# Patient Record
Sex: Female | Born: 1955 | Race: White | Hispanic: No | Marital: Married | Smoking: Former smoker
Health system: Southern US, Community
[De-identification: ages and names within clinical notes are randomized; demographics above are authoritative.]

## PROBLEM LIST (undated history)

## (undated) DIAGNOSIS — M5412 Radiculopathy, cervical region: Secondary | ICD-10-CM

## (undated) DIAGNOSIS — R209 Unspecified disturbances of skin sensation: Secondary | ICD-10-CM

## (undated) DIAGNOSIS — E785 Hyperlipidemia, unspecified: Secondary | ICD-10-CM

## (undated) DIAGNOSIS — I340 Nonrheumatic mitral (valve) insufficiency: Secondary | ICD-10-CM

## (undated) DIAGNOSIS — N200 Calculus of kidney: Secondary | ICD-10-CM

## (undated) DIAGNOSIS — J449 Chronic obstructive pulmonary disease, unspecified: Secondary | ICD-10-CM

## (undated) DIAGNOSIS — F17200 Nicotine dependence, unspecified, uncomplicated: Secondary | ICD-10-CM

## (undated) DIAGNOSIS — M1990 Unspecified osteoarthritis, unspecified site: Secondary | ICD-10-CM

## (undated) DIAGNOSIS — F329 Major depressive disorder, single episode, unspecified: Secondary | ICD-10-CM

## (undated) HISTORY — DX: Unspecified disturbances of skin sensation: R20.9

## (undated) HISTORY — DX: Hyperlipidemia, unspecified: E78.5

## (undated) HISTORY — DX: Calculus of kidney: N20.0

## (undated) HISTORY — DX: Chronic obstructive pulmonary disease, unspecified: J44.9

## (undated) HISTORY — DX: Unspecified osteoarthritis, unspecified site: M19.90

## (undated) HISTORY — DX: Major depressive disorder, single episode, unspecified: F32.9

## (undated) HISTORY — DX: Radiculopathy, cervical region: M54.12

## (undated) HISTORY — PX: ABDOMINAL HYSTERECTOMY: SHX81

## (undated) HISTORY — DX: Nicotine dependence, unspecified, uncomplicated: F17.200

---
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
## 1987-04-08 HISTORY — PX: CHOLECYSTECTOMY: SHX55

## 1988-04-07 HISTORY — PX: OTHER SURGICAL HISTORY: SHX169

## 2003-04-19 ENCOUNTER — Other Ambulatory Visit: Admission: RE | Admit: 2003-04-19 | Discharge: 2003-04-19 | Payer: Self-pay | Admitting: Obstetrics and Gynecology

## 2004-01-23 ENCOUNTER — Encounter
Admission: RE | Admit: 2004-01-23 | Discharge: 2004-01-23 | Payer: Self-pay | Admitting: Physical Medicine and Rehabilitation

## 2004-07-29 ENCOUNTER — Other Ambulatory Visit: Admission: RE | Admit: 2004-07-29 | Discharge: 2004-07-29 | Payer: Self-pay | Admitting: Obstetrics and Gynecology

## 2004-08-22 ENCOUNTER — Encounter: Admission: RE | Admit: 2004-08-22 | Discharge: 2004-08-22 | Payer: Self-pay | Admitting: Obstetrics and Gynecology

## 2004-12-04 ENCOUNTER — Ambulatory Visit: Payer: Self-pay | Admitting: Internal Medicine

## 2004-12-11 ENCOUNTER — Ambulatory Visit: Payer: Self-pay | Admitting: Internal Medicine

## 2006-06-04 ENCOUNTER — Ambulatory Visit: Payer: Self-pay | Admitting: Internal Medicine

## 2006-11-19 DIAGNOSIS — F329 Major depressive disorder, single episode, unspecified: Secondary | ICD-10-CM

## 2006-11-19 DIAGNOSIS — F3289 Other specified depressive episodes: Secondary | ICD-10-CM

## 2006-11-19 DIAGNOSIS — F32A Depression, unspecified: Secondary | ICD-10-CM | POA: Insufficient documentation

## 2006-11-19 DIAGNOSIS — F419 Anxiety disorder, unspecified: Secondary | ICD-10-CM | POA: Insufficient documentation

## 2006-11-19 HISTORY — DX: Other specified depressive episodes: F32.89

## 2006-11-19 HISTORY — DX: Major depressive disorder, single episode, unspecified: F32.9

## 2007-05-04 ENCOUNTER — Encounter: Payer: Self-pay | Admitting: Internal Medicine

## 2007-05-13 ENCOUNTER — Ambulatory Visit: Payer: Self-pay | Admitting: Internal Medicine

## 2007-05-13 DIAGNOSIS — M549 Dorsalgia, unspecified: Secondary | ICD-10-CM | POA: Insufficient documentation

## 2007-06-04 ENCOUNTER — Ambulatory Visit: Payer: Self-pay | Admitting: Internal Medicine

## 2007-06-04 DIAGNOSIS — L03211 Cellulitis of face: Secondary | ICD-10-CM

## 2007-06-04 DIAGNOSIS — L0201 Cutaneous abscess of face: Secondary | ICD-10-CM

## 2007-06-23 ENCOUNTER — Ambulatory Visit: Payer: Self-pay | Admitting: Internal Medicine

## 2007-06-23 ENCOUNTER — Ambulatory Visit: Payer: Self-pay | Admitting: Cardiology

## 2007-07-01 ENCOUNTER — Encounter: Payer: Self-pay | Admitting: Internal Medicine

## 2007-08-24 ENCOUNTER — Ambulatory Visit: Payer: Self-pay | Admitting: Internal Medicine

## 2007-08-24 DIAGNOSIS — J069 Acute upper respiratory infection, unspecified: Secondary | ICD-10-CM | POA: Insufficient documentation

## 2007-08-24 DIAGNOSIS — R209 Unspecified disturbances of skin sensation: Secondary | ICD-10-CM

## 2007-08-24 HISTORY — DX: Unspecified disturbances of skin sensation: R20.9

## 2007-08-31 ENCOUNTER — Ambulatory Visit: Payer: Self-pay | Admitting: Internal Medicine

## 2007-08-31 DIAGNOSIS — J449 Chronic obstructive pulmonary disease, unspecified: Secondary | ICD-10-CM

## 2007-08-31 DIAGNOSIS — M545 Low back pain: Secondary | ICD-10-CM

## 2007-08-31 DIAGNOSIS — J4489 Other specified chronic obstructive pulmonary disease: Secondary | ICD-10-CM

## 2007-08-31 HISTORY — DX: Chronic obstructive pulmonary disease, unspecified: J44.9

## 2007-08-31 HISTORY — DX: Other specified chronic obstructive pulmonary disease: J44.89

## 2007-12-23 ENCOUNTER — Ambulatory Visit: Payer: Self-pay | Admitting: Internal Medicine

## 2007-12-23 LAB — CONVERTED CEMR LAB
ALT: 14 units/L (ref 0–35)
AST: 16 units/L (ref 0–37)
Albumin: 4 g/dL (ref 3.5–5.2)
Alkaline Phosphatase: 84 units/L (ref 39–117)
BUN: 12 mg/dL (ref 6–23)
Basophils Absolute: 0.1 10*3/uL (ref 0.0–0.1)
Basophils Relative: 0.7 % (ref 0.0–3.0)
Bilirubin Urine: NEGATIVE
Bilirubin, Direct: 0.2 mg/dL (ref 0.0–0.3)
Blood in Urine, dipstick: NEGATIVE
CO2: 29 meq/L (ref 19–32)
Calcium: 9.5 mg/dL (ref 8.4–10.5)
Chloride: 103 meq/L (ref 96–112)
Cholesterol: 205 mg/dL (ref 0–200)
Creatinine, Ser: 0.8 mg/dL (ref 0.4–1.2)
Direct LDL: 149.8 mg/dL
Eosinophils Absolute: 0.2 10*3/uL (ref 0.0–0.7)
Eosinophils Relative: 1.8 % (ref 0.0–5.0)
GFR calc Af Amer: 96 mL/min
GFR calc non Af Amer: 80 mL/min
Glucose, Bld: 93 mg/dL (ref 70–99)
Glucose, Urine, Semiquant: NEGATIVE
HCT: 41.4 % (ref 36.0–46.0)
HDL: 30.6 mg/dL — ABNORMAL LOW (ref >39.0)
Hemoglobin: 14.2 g/dL (ref 12.0–15.0)
Ketones, urine, test strip: NEGATIVE
LDL Cholesterol: 151 mg/dL — ABNORMAL HIGH (ref 0–99)
Lymphocytes Relative: 32.2 % (ref 12.0–46.0)
MCHC: 34.4 g/dL (ref 30.0–36.0)
MCV: 94.5 fL (ref 78.0–100.0)
Monocytes Absolute: 0.6 10*3/uL (ref 0.1–1.0)
Monocytes Relative: 7.3 % (ref 3.0–12.0)
Neutro Abs: 4.8 10*3/uL (ref 1.4–7.7)
Neutrophils Relative %: 58 % (ref 43.0–77.0)
Nitrite: NEGATIVE
Platelets: 263 10*3/uL (ref 150–400)
Potassium: 4.3 meq/L (ref 3.5–5.1)
Protein, U semiquant: NEGATIVE
RBC: 4.38 M/uL (ref 3.87–5.11)
RDW: 13.6 % (ref 11.5–14.6)
Sodium: 141 meq/L (ref 135–145)
Specific Gravity, Urine: 1.025
TSH: 1.27 microintl units/mL (ref 0.35–5.50)
Total Bilirubin: 0.7 mg/dL (ref 0.3–1.2)
Total CHOL/HDL Ratio: 6.7
Total Protein: 7 g/dL (ref 6.0–8.3)
Triglycerides: 119 mg/dL (ref 0–149)
Urobilinogen, UA: 0.2
VLDL: 24 mg/dL (ref 0–40)
WBC Urine, dipstick: NEGATIVE
WBC: 8.4 10*3/uL (ref 4.5–10.5)
pH: 5.5

## 2008-01-05 ENCOUNTER — Ambulatory Visit: Payer: Self-pay | Admitting: Internal Medicine

## 2008-01-05 ENCOUNTER — Other Ambulatory Visit: Admission: RE | Admit: 2008-01-05 | Discharge: 2008-01-05 | Payer: Self-pay | Admitting: Internal Medicine

## 2008-01-05 ENCOUNTER — Encounter: Payer: Self-pay | Admitting: Internal Medicine

## 2008-01-05 DIAGNOSIS — N200 Calculus of kidney: Secondary | ICD-10-CM

## 2008-01-05 HISTORY — DX: Calculus of kidney: N20.0

## 2008-01-05 LAB — HM PAP SMEAR

## 2009-03-06 ENCOUNTER — Ambulatory Visit: Payer: Self-pay | Admitting: Internal Medicine

## 2009-03-06 LAB — CONVERTED CEMR LAB
ALT: 16 units/L (ref 0–35)
AST: 20 units/L (ref 0–37)
Albumin: 4.2 g/dL (ref 3.5–5.2)
Alkaline Phosphatase: 91 units/L (ref 39–117)
BUN: 11 mg/dL (ref 6–23)
Basophils Absolute: 0.1 10*3/uL (ref 0.0–0.1)
Basophils Relative: 0.8 % (ref 0.0–3.0)
Bilirubin Urine: NEGATIVE
Bilirubin, Direct: 0 mg/dL (ref 0.0–0.3)
Blood in Urine, dipstick: NEGATIVE
CO2: 31 meq/L (ref 19–32)
Calcium: 9.8 mg/dL (ref 8.4–10.5)
Chloride: 106 meq/L (ref 96–112)
Cholesterol: 234 mg/dL — ABNORMAL HIGH (ref 0–200)
Creatinine, Ser: 0.7 mg/dL (ref 0.4–1.2)
Direct LDL: 172.3 mg/dL
Eosinophils Absolute: 0.3 10*3/uL (ref 0.0–0.7)
Eosinophils Relative: 2.2 % (ref 0.0–5.0)
GFR calc non Af Amer: 92.31 mL/min (ref >60)
Glucose, Bld: 104 mg/dL — ABNORMAL HIGH (ref 70–99)
Glucose, Urine, Semiquant: NEGATIVE
HCT: 43 % (ref 36.0–46.0)
HDL: 33 mg/dL — ABNORMAL LOW (ref >39.00)
Hemoglobin: 14.4 g/dL (ref 12.0–15.0)
Ketones, urine, test strip: NEGATIVE
Lymphocytes Relative: 36.3 % (ref 12.0–46.0)
Lymphs Abs: 4.6 10*3/uL — ABNORMAL HIGH (ref 0.7–4.0)
MCHC: 33.6 g/dL (ref 30.0–36.0)
MCV: 97.3 fL (ref 78.0–100.0)
Monocytes Absolute: 0.9 10*3/uL (ref 0.1–1.0)
Monocytes Relative: 7.5 % (ref 3.0–12.0)
Neutro Abs: 6.7 10*3/uL (ref 1.4–7.7)
Neutrophils Relative %: 53.2 % (ref 43.0–77.0)
Nitrite: NEGATIVE
Platelets: 253 10*3/uL (ref 150.0–400.0)
Potassium: 4.3 meq/L (ref 3.5–5.1)
Protein, U semiquant: NEGATIVE
RBC: 4.42 M/uL (ref 3.87–5.11)
RDW: 13.3 % (ref 11.5–14.6)
Sodium: 144 meq/L (ref 135–145)
Specific Gravity, Urine: 1.025
TSH: 1.87 microintl units/mL (ref 0.35–5.50)
Total Bilirubin: 0.6 mg/dL (ref 0.3–1.2)
Total CHOL/HDL Ratio: 7
Total Protein: 7.4 g/dL (ref 6.0–8.3)
Triglycerides: 213 mg/dL — ABNORMAL HIGH (ref 0.0–149.0)
Urobilinogen, UA: 0.2
VLDL: 42.6 mg/dL — ABNORMAL HIGH (ref 0.0–40.0)
WBC Urine, dipstick: NEGATIVE
WBC: 12.6 10*3/uL — ABNORMAL HIGH (ref 4.5–10.5)
pH: 5

## 2009-03-12 ENCOUNTER — Ambulatory Visit: Payer: Self-pay | Admitting: Internal Medicine

## 2009-03-12 DIAGNOSIS — E785 Hyperlipidemia, unspecified: Secondary | ICD-10-CM | POA: Insufficient documentation

## 2009-03-12 HISTORY — DX: Hyperlipidemia, unspecified: E78.5

## 2009-06-05 ENCOUNTER — Ambulatory Visit: Payer: Self-pay | Admitting: Internal Medicine

## 2009-06-05 DIAGNOSIS — M1990 Unspecified osteoarthritis, unspecified site: Secondary | ICD-10-CM

## 2009-06-05 DIAGNOSIS — M5412 Radiculopathy, cervical region: Secondary | ICD-10-CM | POA: Insufficient documentation

## 2009-06-05 HISTORY — DX: Unspecified osteoarthritis, unspecified site: M19.90

## 2009-06-05 HISTORY — DX: Radiculopathy, cervical region: M54.12

## 2009-06-11 ENCOUNTER — Ambulatory Visit: Payer: Self-pay | Admitting: Internal Medicine

## 2009-06-18 ENCOUNTER — Encounter: Payer: Self-pay | Admitting: Internal Medicine

## 2009-06-21 ENCOUNTER — Telehealth: Payer: Self-pay | Admitting: Internal Medicine

## 2009-09-11 ENCOUNTER — Ambulatory Visit: Payer: Self-pay | Admitting: Internal Medicine

## 2009-09-11 LAB — CONVERTED CEMR LAB
Cholesterol: 239 mg/dL — ABNORMAL HIGH (ref 0–200)
Direct LDL: 162.2 mg/dL
HDL: 41.1 mg/dL (ref >39.00)
Total CHOL/HDL Ratio: 6
Triglycerides: 193 mg/dL — ABNORMAL HIGH (ref 0.0–149.0)
VLDL: 38.6 mg/dL (ref 0.0–40.0)

## 2010-01-07 ENCOUNTER — Ambulatory Visit: Payer: Self-pay | Admitting: Internal Medicine

## 2010-01-10 ENCOUNTER — Ambulatory Visit: Payer: Self-pay | Admitting: Internal Medicine

## 2010-01-10 ENCOUNTER — Telehealth: Payer: Self-pay | Admitting: Internal Medicine

## 2010-01-10 DIAGNOSIS — J159 Unspecified bacterial pneumonia: Secondary | ICD-10-CM | POA: Insufficient documentation

## 2010-01-25 ENCOUNTER — Ambulatory Visit: Payer: Self-pay | Admitting: Internal Medicine

## 2010-01-25 DIAGNOSIS — F17200 Nicotine dependence, unspecified, uncomplicated: Secondary | ICD-10-CM

## 2010-01-25 HISTORY — DX: Nicotine dependence, unspecified, uncomplicated: F17.200

## 2010-02-26 ENCOUNTER — Ambulatory Visit: Payer: Self-pay | Admitting: Internal Medicine

## 2010-02-26 LAB — CONVERTED CEMR LAB
Bilirubin Urine: NEGATIVE
Glucose, Urine, Semiquant: NEGATIVE
Ketones, urine, test strip: NEGATIVE
Nitrite: NEGATIVE
Protein, U semiquant: NEGATIVE
Specific Gravity, Urine: 1.025
Urobilinogen, UA: 0.2
WBC Urine, dipstick: NEGATIVE
pH: 5

## 2010-02-27 LAB — CONVERTED CEMR LAB
ALT: 10 units/L (ref 0–35)
AST: 15 units/L (ref 0–37)
Albumin: 4 g/dL (ref 3.5–5.2)
Alkaline Phosphatase: 73 units/L (ref 39–117)
BUN: 18 mg/dL (ref 6–23)
Basophils Absolute: 0.1 10*3/uL (ref 0.0–0.1)
Basophils Relative: 0.7 % (ref 0.0–3.0)
Bilirubin, Direct: 0 mg/dL (ref 0.0–0.3)
CO2: 27 meq/L (ref 19–32)
Calcium: 9.4 mg/dL (ref 8.4–10.5)
Chloride: 104 meq/L (ref 96–112)
Cholesterol: 236 mg/dL — ABNORMAL HIGH (ref 0–200)
Creatinine, Ser: 0.7 mg/dL (ref 0.4–1.2)
Direct LDL: 166.1 mg/dL
Eosinophils Absolute: 0.2 10*3/uL (ref 0.0–0.7)
Eosinophils Relative: 2.1 % (ref 0.0–5.0)
GFR calc non Af Amer: 86.27 mL/min (ref >60)
Glucose, Bld: 95 mg/dL (ref 70–99)
HCT: 41.2 % (ref 36.0–46.0)
HDL: 37.9 mg/dL — ABNORMAL LOW (ref >39.00)
Hemoglobin: 14.2 g/dL (ref 12.0–15.0)
Lymphocytes Relative: 49.3 % — ABNORMAL HIGH (ref 12.0–46.0)
Lymphs Abs: 4.9 10*3/uL — ABNORMAL HIGH (ref 0.7–4.0)
MCHC: 34.5 g/dL (ref 30.0–36.0)
MCV: 94 fL (ref 78.0–100.0)
Monocytes Absolute: 0.7 10*3/uL (ref 0.1–1.0)
Monocytes Relative: 7.1 % (ref 3.0–12.0)
Neutro Abs: 4.1 10*3/uL (ref 1.4–7.7)
Neutrophils Relative %: 40.8 % — ABNORMAL LOW (ref 43.0–77.0)
Platelets: 285 10*3/uL (ref 150.0–400.0)
Potassium: 4 meq/L (ref 3.5–5.1)
RBC: 4.38 M/uL (ref 3.87–5.11)
RDW: 14.8 % — ABNORMAL HIGH (ref 11.5–14.6)
Sodium: 141 meq/L (ref 135–145)
TSH: 2.39 microintl units/mL (ref 0.35–5.50)
Total Bilirubin: 0.3 mg/dL (ref 0.3–1.2)
Total CHOL/HDL Ratio: 6
Total Protein: 6.4 g/dL (ref 6.0–8.3)
Triglycerides: 157 mg/dL — ABNORMAL HIGH (ref 0.0–149.0)
VLDL: 31.4 mg/dL (ref 0.0–40.0)
WBC: 10 10*3/uL (ref 4.5–10.5)

## 2010-03-05 ENCOUNTER — Ambulatory Visit: Payer: Self-pay | Admitting: Internal Medicine

## 2010-03-08 ENCOUNTER — Telehealth: Payer: Self-pay | Admitting: Internal Medicine

## 2010-03-11 ENCOUNTER — Ambulatory Visit: Payer: Self-pay | Admitting: Internal Medicine

## 2010-03-11 ENCOUNTER — Inpatient Hospital Stay (HOSPITAL_COMMUNITY): Admission: AD | Admit: 2010-03-11 | Discharge: 2010-03-14 | Payer: Self-pay | Source: Home / Self Care

## 2010-03-12 ENCOUNTER — Telehealth: Payer: Self-pay

## 2010-03-21 ENCOUNTER — Telehealth: Payer: Self-pay | Admitting: Internal Medicine

## 2010-03-27 ENCOUNTER — Ambulatory Visit: Payer: Self-pay | Admitting: Pulmonary Disease

## 2010-03-27 ENCOUNTER — Encounter: Payer: Self-pay | Admitting: Pulmonary Disease

## 2010-04-26 ENCOUNTER — Ambulatory Visit: Admit: 2010-04-26 | Payer: Self-pay | Admitting: Pulmonary Disease

## 2010-04-27 ENCOUNTER — Encounter: Payer: Self-pay | Admitting: Physical Medicine and Rehabilitation

## 2010-05-05 LAB — CONVERTED CEMR LAB
ALT: 12 units/L (ref 0–35)
AST: 15 units/L (ref 0–37)
Albumin: 4 g/dL (ref 3.5–5.2)
Alkaline Phosphatase: 92 units/L (ref 39–117)
BUN: 13 mg/dL (ref 6–23)
Basophils Absolute: 0.1 10*3/uL (ref 0.0–0.1)
Basophils Relative: 0.8 % (ref 0.0–1.0)
Bilirubin, Direct: 0.2 mg/dL (ref 0.0–0.3)
CO2: 30 meq/L (ref 19–32)
Calcium: 9.8 mg/dL (ref 8.4–10.5)
Chloride: 106 meq/L (ref 96–112)
Cholesterol: 199 mg/dL (ref 0–200)
Creatinine, Ser: 0.7 mg/dL (ref 0.4–1.2)
Eosinophils Absolute: 0.2 10*3/uL (ref 0.0–0.6)
Eosinophils Relative: 2.6 % (ref 0.0–5.0)
GFR calc Af Amer: 113 mL/min
GFR calc non Af Amer: 93 mL/min
Glucose, Bld: 108 mg/dL — ABNORMAL HIGH (ref 70–99)
HCT: 41.9 % (ref 36.0–46.0)
HDL: 34.1 mg/dL — ABNORMAL LOW (ref >39.0)
Hemoglobin: 13.9 g/dL (ref 12.0–15.0)
LDL Cholesterol: 140 mg/dL — ABNORMAL HIGH (ref 0–99)
Lymphocytes Relative: 28.8 % (ref 12.0–46.0)
MCHC: 33.2 g/dL (ref 30.0–36.0)
MCV: 94 fL (ref 78.0–100.0)
Monocytes Absolute: 0.6 10*3/uL (ref 0.2–0.7)
Monocytes Relative: 6.6 % (ref 3.0–11.0)
Neutro Abs: 5.9 10*3/uL (ref 1.4–7.7)
Neutrophils Relative %: 61.2 % (ref 43.0–77.0)
Platelets: 279 10*3/uL (ref 150–400)
Potassium: 4.5 meq/L (ref 3.5–5.1)
RBC: 4.46 M/uL (ref 3.87–5.11)
RDW: 13.1 % (ref 11.5–14.6)
Sodium: 142 meq/L (ref 135–145)
TSH: 1.08 microintl units/mL (ref 0.35–5.50)
Total Bilirubin: 0.4 mg/dL (ref 0.3–1.2)
Total CHOL/HDL Ratio: 5.8
Total Protein: 6.5 g/dL (ref 6.0–8.3)
Triglycerides: 124 mg/dL (ref 0–149)
VLDL: 25 mg/dL (ref 0–40)
WBC: 9.5 10*3/uL (ref 4.5–10.5)

## 2010-05-07 NOTE — Progress Notes (Signed)
Summary: call pt  Phone Note Call from Patient   Caller: Patient Call For: Gordy Savers  MD Summary of Call: Pt is calling for chest xray results,and also to let Dr.K know she is more SOB this afternoon.  Dr. Kirtland Bouchard notified and he will call he pt. [phone] Initial call taken by: Lynann Beaver CMA,  January 10, 2010 4:26 PM  Follow-up for Phone Call        called Follow-up by: Gordy Savers  MD,  January 10, 2010 5:11 PM

## 2010-05-07 NOTE — Assessment & Plan Note (Signed)
Summary: COUGH/CHEST CONGESTION/STUFFY NOSE/CJR   Vital Signs:  Patient profile:   55 year old female Height:      63 inches Weight:      128 pounds BMI:     22.76 Temp:     98.4 degrees F oral Pulse rate:   80 / minute Resp:     14 per minute BP sitting:   110 / 76  (left arm)  Vitals Entered By: Willy Eddy, LPN (January 25, 2010 9:09 AM) CC: continues to c/o cough and congestion- still smokes but has not a cigarette sincd wednesday   CC:  continues to c/o cough and congestion- still smokes but has not a cigarette sincd wednesday.  History of Present Illness: 55 year old patient who is seen today for follow-up of her bronchitis and exacerbation of COPD a chest x-ray was obtained and revealed no pneumonia, but significant bronchitic changes only.  Her chief complaint is significant cough, but she has improved.  Cough is nonproductive.  No fever or chills.  She is a tobacco user, but not for the past two days  Preventive Screening-Counseling & Management  Alcohol-Tobacco     Smoking Status: current     Smoking Cessation Counseling: yes  Current Problems (verified): 1)  Bacterial Pneumonia  (ICD-482.9) 2)  C8 Radiculopathy  (ICD-723.4) 3)  Osteoarthritis  (ICD-715.90) 4)  Dyslipidemia  (ICD-272.4) 5)  Preventive Health Care  (ICD-V70.0) 6)  Nephrolithiasis  (ICD-592.0) 7)  Low Back Pain  (ICD-724.2) 8)  COPD  (ICD-496) 9)  Paresthesia  (ICD-782.0) 10)  Uri  (ICD-465.9) 11)  Cellulitis, Face  (ICD-682.0) 12)  Back Pain  (ICD-724.5) 13)  Family History of Melanoma  (ICD-V16.8) 14)  Family History Diabetes 1st Degree Relative  (ICD-V18.0) 15)  Family History of Alcoholism/addiction  (ICD-V61.41) 16)  Depression  (ICD-311)  Current Medications (verified): 1)  Paxil Cr25  Mg  Tb24 (Paroxetine Hcl) .Marland Kitchen.. 1 Once Daily 2)  Lorcet Plus 7.5-650 Mg  Tabs (Hydrocodone-Acetaminophen) .Marland Kitchen.. 1 Q6h As Needed 3)  Alprazolam 0.25 Mg  Tabs (Alprazolam) .Marland Kitchen.. 1 Two Times A Day As  Needed 4)  Aleve 220 Mg Tabs (Naproxen Sodium) .... Bid  Allergies (verified): 1)  ! Pcn  Past History:  Past Medical History: Reviewed history from 06/05/2009 and no changes required. Depression Kidney Stones COPD tobacco use Low back pain penicillin allergy Osteoarthritis  Physical Exam  General:  Well-developed,well-nourished,in no acute distress; alert,appropriate and cooperative throughout examination; frequent paroxysms of coughing Head:  Normocephalic and atraumatic without obvious abnormalities. No apparent alopecia or balding. Eyes:  No corneal or conjunctival inflammation noted. EOMI. Perrla. Funduscopic exam benign, without hemorrhages, exudates or papilledema. Vision grossly normal. Mouth:  Oral mucosa and oropharynx without lesions or exudates.  Teeth in good repair. Neck:  No deformities, masses, or tenderness noted. Lungs:  scattered  rhonchi.  No active wheezing.  O2 saturation normal.  No increased work of breathing Heart:  Normal rate and regular rhythm. S1 and S2 normal without gallop, murmur, click, rub or other extra sounds.   Impression & Recommendations:  Problem # 1:  COPD (ICD-496) patient has a exacerbation of COPD and has been treated with antibiotic therapy; post cessation of smoking.  Encouraged.  Will place on Dulera one puff twice daily  Problem # 2:  TOBACCO ABUSE (ICD-305.1)  Complete Medication List: 1)  Paxil Cr25 Mg Tb24 (paroxetine Hcl)  .Marland Kitchen.. 1 once daily 2)  Lorcet Plus 7.5-650 Mg Tabs (Hydrocodone-acetaminophen) .Marland Kitchen.. 1 q6h as needed 3)  Alprazolam 0.25 Mg Tabs (Alprazolam) .Marland Kitchen.. 1 two times a day as needed 4)  Aleve 220 Mg Tabs (Naproxen sodium) .... Bid 5)  Prednisone 5 Mg Tabs (Prednisone) .... One twice daily 6)  Hydrocodone-homatropine 5-1.5 Mg/38ml Syrp (Hydrocodone-homatropine) .Marland Kitchen.. 1 teaspoon  every 6 hours as needed for cough  Patient Instructions: 1)  Tobacco is very bad for your health and your loved ones! You Should stop  smoking!. 2)  Get plenty of rest, drink lots of clear liquids, and use Tylenol or Ibuprofen for fever and comfort. Prescriptions: HYDROCODONE-HOMATROPINE 5-1.5 MG/5ML SYRP (HYDROCODONE-HOMATROPINE) 1 teaspoon  every 6 hours as needed for cough  #6 oz x 0   Entered and Authorized by:   Gordy Savers  MD   Signed by:   Gordy Savers  MD on 01/25/2010   Method used:   Print then Give to Patient   RxID:   [id number] PREDNISONE 5 MG TABS (PREDNISONE) one twice daily  #20 x 0   Entered and Authorized by:   Gordy Savers  MD   Signed by:   Gordy Savers  MD on 01/25/2010   Method used:   Print then Give to Patient   RxID:   [id number]    Orders Added: 1)  Est. Patient Level III [32951]

## 2010-05-07 NOTE — Assessment & Plan Note (Signed)
Summary: 6 mo rov/mm   Vital Signs:  Patient profile:   55 year old female Weight:      125 pounds Temp:     97.6 degrees F oral BP sitting:   120 / 78  (left arm) Cuff size:   regular  Vitals Entered By: Duard Brady LPN (September 11, 1608 8:11 AM) CC: 6 mos rov - doing well Is Patient Diabetic? No   CC:  6 mos rov - doing well.  History of Present Illness: 55 year old patient who is seen today for follow-up of her dyslipidemia.  She has a history of ongoing tobacco use, but a stop date is scheduled for later this month when her grandson arrives  for a visit.  She has done quite well.  She was seen earlier in the year with a left C8 radiculopathy, which has resolved.  He is adhering to a better diet.  He has a history of depression, which has also been stable.  Denies any pulmonary complaints.  Preventive Screening-Counseling & Management  Alcohol-Tobacco     Smoking Status: current  Allergies: 1)  ! Pcn  Past History:  Past Medical History: Reviewed history from 06/05/2009 and no changes required. Depression Kidney Stones COPD tobacco use Low back pain penicillin allergy Osteoarthritis  Past Surgical History: Reviewed history from 01/05/2008 and no changes required. Appendectomy 1969 Cholecystectomy 1989 Exploratory laparotomy 1969 Collapse Lung 1990  no screening colonoscopy  Review of Systems  The patient denies anorexia, fever, weight loss, weight gain, vision loss, decreased hearing, hoarseness, chest pain, syncope, dyspnea on exertion, peripheral edema, prolonged cough, headaches, hemoptysis, abdominal pain, melena, hematochezia, severe indigestion/heartburn, hematuria, incontinence, genital sores, muscle weakness, suspicious skin lesions, transient blindness, difficulty walking, depression, unusual weight change, abnormal bleeding, enlarged lymph nodes, angioedema, and breast masses.    Physical Exam  General:  Well-developed,well-nourished,in no  acute distress; alert,appropriate and cooperative throughout examination; low-pressure low normal Head:  Normocephalic and atraumatic without obvious abnormalities. No apparent alopecia or balding. Eyes:  No corneal or conjunctival inflammation noted. EOMI. Perrla. Funduscopic exam benign, without hemorrhages, exudates or papilledema. Vision grossly normal. Mouth:  Oral mucosa and oropharynx without lesions or exudates.  Teeth in good repair. Neck:  No deformities, masses, or tenderness noted. Lungs:  Normal respiratory effort, chest expands symmetrically. Lungs are clear to auscultation, no crackles or wheezes. Heart:  Normal rate and regular rhythm. S1 and S2 normal without gallop, murmur, click, rub or other extra sounds. Abdomen:  Bowel sounds positive,abdomen soft and non-tender without masses, organomegaly or hernias noted. Msk:  No deformity or scoliosis noted of thoracic or lumbar spine.   Pulses:  pedal pulses, full Extremities:  No clubbing, cyanosis, edema, or deformity noted with normal full range of motion of all joints.     Impression & Recommendations:  Problem # 1:  DYSLIPIDEMIA (ICD-272.4)  Problem # 2:  LOW BACK PAIN (ICD-724.2)  Her updated medication list for this problem includes:    Lorcet Plus 7.5-650 Mg Tabs (Hydrocodone-acetaminophen) .Marland Kitchen... 1 q6h as needed    Aleve 220 Mg Tabs (Naproxen sodium) ..... Bid  Her updated medication list for this problem includes:    Lorcet Plus 7.5-650 Mg Tabs (Hydrocodone-acetaminophen) .Marland Kitchen... 1 q6h as needed    Aleve 220 Mg Tabs (Naproxen sodium) ..... Bid  Problem # 3:  COPD (ICD-496)  Complete Medication List: 1)  Paxil Cr25 Mg Tb24 (paroxetine Hcl)  .Marland Kitchen.. 1 once daily 2)  Lorcet Plus 7.5-650 Mg Tabs (Hydrocodone-acetaminophen) .Marland KitchenMarland KitchenMarland Kitchen 1  q6h as needed 3)  Alprazolam 0.25 Mg Tabs (Alprazolam) .Marland Kitchen.. 1 two times a day as needed 4)  Chantix Starting Month Pak 0.5 Mg X 11 & 1 Mg X 42 Tabs (Varenicline tartrate) .... As directed 5)   Chantix 1 Mg Tabs (Varenicline tartrate) .... One twice daily 6)  Aleve 220 Mg Tabs (Naproxen sodium) .... Bid  Other Orders: Venipuncture (84696) TLB-Lipid Panel (80061-LIPID)  Patient Instructions: 1)  Please schedule a follow-up appointment in 6 months. 2)  It is important that you exercise regularly at least 20 minutes 5 times a week. If you develop chest pain, have severe difficulty breathing, or feel very tired , stop exercising immediately and seek medical attention. 3)  Schedule a colonoscopy/sigmoidoscopy to help detect colon cancer. 4)  Take calcium +Vitamin D daily. Prescriptions: ALPRAZOLAM 0.25 MG  TABS (ALPRAZOLAM) 1 two times a day as needed  #60 x 3   Entered and Authorized by:   Gordy Savers  MD   Signed by:   Gordy Savers  MD on 09/11/2009   Method used:   Print then Give to Patient   RxID:   [id number] LORCET PLUS 7.5-650 MG  TABS (HYDROCODONE-ACETAMINOPHEN) 1 q6h as needed  #90 x 3   Entered and Authorized by:   Gordy Savers  MD   Signed by:   Gordy Savers  MD on 09/11/2009   Method used:   Print then Give to Patient   RxID:   [id number] PAXIL CR25  MG  TB24 (PAROXETINE HCL) 1 once daily  #90 x 6   Entered and Authorized by:   Gordy Savers  MD   Signed by:   Gordy Savers  MD on 09/11/2009   Method used:   Print then Give to Patient   RxID:   [id number]

## 2010-05-07 NOTE — Assessment & Plan Note (Signed)
Summary: cpx/no pap/njr pt stated she can have 1 per calendar/njr   Vital Signs:  Patient profile:   55 year old female Height:      62 inches Weight:      127 pounds BMI:     23.31 Temp:     98.3 degrees F oral BP sitting:   114 / 80  (left arm) Cuff size:   regular  Vitals Entered By: Duard Brady LPN (March 05, 2010 2:42 PM) CC: cpx - still with c/o congested cough Is Patient Diabetic? No   CC:  cpx - still with c/o congested cough.  History of Present Illness: 55 y/o female, who is seen today for a preventative health examination.  Medical issues include chronic bronchitis, and ongoing tobacco use.  She has a history of depression, anxiety, osteoarthritis, and some chronic low back pain.  She has recement proscribe simvastatin 4 dyslipidemia, which is not yet taken.  Preventive Screening-Counseling & Management  Alcohol-Tobacco     Smoking Cessation Counseling: yes  Allergies: 1)  ! Pcn  Past History:  Past Medical History: Reviewed history from 06/05/2009 and no changes required. Depression Kidney Stones COPD tobacco use Low back pain penicillin allergy Osteoarthritis  Past Surgical History: Reviewed history from 01/05/2008 and no changes required. Appendectomy 1969 Cholecystectomy 1989 Exploratory laparotomy 1969 Collapse Lung 1990  no screening colonoscopy  Family History: Reviewed history from 08/31/2007 and no changes required. Family History of MS Family History of Alcoholism/Addiction Family History of Arthritis Family History Diabetes 1st degree relative Family History Kidney disease Family History Lung cancer Family History of Melanoma Family History Ovarian cancer Family History of Skin cancer Family History of Stroke M 1st degree relative <50 Family History Uterine cancer Family History of Cardiovascular disorder  father died age 65,  accidental death mother died age 39, lung cancer one brother, history of MS 3 sisters;  positive for RA, diabetes, thyroid disease, and hypertension  Social History: Reviewed history from 11/19/2006 and no changes required. Married Current Smoker Alcohol use-no Drug use-no Regular exercise-yes  Review of Systems       The patient complains of prolonged cough.  The patient denies anorexia, fever, weight loss, weight gain, vision loss, decreased hearing, hoarseness, chest pain, syncope, dyspnea on exertion, peripheral edema, headaches, hemoptysis, abdominal pain, melena, hematochezia, severe indigestion/heartburn, hematuria, incontinence, genital sores, muscle weakness, suspicious skin lesions, transient blindness, difficulty walking, depression, unusual weight change, abnormal bleeding, enlarged lymph nodes, angioedema, and breast masses.    Physical Exam  General:  Well-developed,well-nourished,in no acute distress; alert,appropriate and cooperative throughout examination Head:  Normocephalic and atraumatic without obvious abnormalities. No apparent alopecia or balding. Eyes:  No corneal or conjunctival inflammation noted. EOMI. Perrla. Funduscopic exam benign, without hemorrhages, exudates or papilledema. Vision grossly normal. Ears:  External ear exam shows no significant lesions or deformities.  Otoscopic examination reveals clear canals, tympanic membranes are intact bilaterally without bulging, retraction, inflammation or discharge. Hearing is grossly normal bilaterally. Nose:  External nasal examination shows no deformity or inflammation. Nasal mucosa are pink and moist without lesions or exudates. Mouth:  Oral mucosa and oropharynx without lesions or exudates.  edentulous Neck:  No deformities, masses, or tenderness noted. Chest Wall:  No deformities, masses, or tenderness noted. Lungs:  Normal respiratory effort, chest expands symmetrically. Lungs are clear to auscultation, no crackles or wheezes. Heart:  Normal rate and regular rhythm. S1 and S2 normal without gallop,  murmur, click, rub or other extra sounds. Abdomen:  Bowel sounds  positive,abdomen soft and non-tender without masses, organomegaly or hernias noted. Msk:  No deformity or scoliosis noted of thoracic or lumbar spine.   Pulses:  R and L carotid,radial,femoral,dorsalis pedis and posterior tibial pulses are full and equal bilaterally Extremities:  No clubbing, cyanosis, edema, or deformity noted with normal full range of motion of all joints.   Neurologic:  No cranial nerve deficits noted. Station and gait are normal. Plantar reflexes are down-going bilaterally. DTRs are symmetrical throughout. Sensory, motor and coordinative functions appear intact. Skin:  Intact without suspicious lesions or rashes Cervical Nodes:  No lymphadenopathy noted Axillary Nodes:  No palpable lymphadenopathy Inguinal Nodes:  No significant adenopathy Psych:  Cognition and judgment appear intact. Alert and cooperative with normal attention span and concentration. No apparent delusions, illusions, hallucinations   Impression & Recommendations:  Problem # 1:  PREVENTIVE HEALTH CARE (ICD-V70.0)  Orders: Gastroenterology Referral (GI)  Complete Medication List: 1)  Paxil Cr25 Mg Tb24 (paroxetine Hcl)  .Marland Kitchen.. 1 once daily 2)  Lorcet Plus 7.5-650 Mg Tabs (Hydrocodone-acetaminophen) .Marland Kitchen.. 1 q6h as needed 3)  Alprazolam 0.25 Mg Tabs (Alprazolam) .Marland Kitchen.. 1 two times a day as needed 4)  Aleve 220 Mg Tabs (Naproxen sodium) .... Bid 5)  Prednisone 5 Mg Tabs (Prednisone) .... One twice daily 6)  Hydrocodone-homatropine 5-1.5 Mg/73ml Syrp (Hydrocodone-homatropine) .Marland Kitchen.. 1 teaspoon  every 6 hours as needed for cough 7)  Simvastatin 40 Mg Tabs (Simvastatin) .... Qd  Patient Instructions: 1)  Please schedule a follow-up appointment in 3 months. 2)  Limit your Sodium (Salt). 3)  Tobacco is very bad for your health and your loved ones! You Should stop smoking!. 4)  It is important that you exercise regularly at least 20 minutes 5 times a  week. If you develop chest pain, have severe difficulty breathing, or feel very tired , stop exercising immediately and seek medical attention. 5)  Schedule your mammogram. 6)  Schedule a colonoscopy/sigmoidoscopy to help detect colon cancer. 7)  Take calcium +Vitamin D daily. Prescriptions: HYDROCODONE-HOMATROPINE 5-1.5 MG/5ML SYRP (HYDROCODONE-HOMATROPINE) 1 teaspoon  every 6 hours as needed for cough  #6 oz x 0   Entered and Authorized by:   Gordy Savers  MD   Signed by:   Gordy Savers  MD on 03/05/2010   Method used:   Print then Give to Patient   RxID:   [id number] ALPRAZOLAM 0.25 MG  TABS (ALPRAZOLAM) 1 two times a day as needed  #60 x 3   Entered and Authorized by:   Gordy Savers  MD   Signed by:   Gordy Savers  MD on 03/05/2010   Method used:   Print then Give to Patient   RxID:   [id number] LORCET PLUS 7.5-650 MG  TABS (HYDROCODONE-ACETAMINOPHEN) 1 q6h as needed  #90 x 3   Entered and Authorized by:   Gordy Savers  MD   Signed by:   Gordy Savers  MD on 03/05/2010   Method used:   Print then Give to Patient   RxID:   [id number] PAXIL CR25  MG  TB24 (PAROXETINE HCL) 1 once daily  #90 x 6   Entered and Authorized by:   Gordy Savers  MD   Signed by:   Gordy Savers  MD on 03/05/2010   Method used:   Print then Give to Patient   RxID:   [id number]    Orders Added: 1)  Est. Patient 40-64 years [06301] 2)  Gastroenterology Referral [  GI]

## 2010-05-07 NOTE — Assessment & Plan Note (Signed)
Summary: BRONCHITIS//ALP   Vital Signs:  Patient profile:   55 year old female Weight:      129 pounds Temp:     98.1 degrees F oral BP sitting:   150 / 90  (left arm) Cuff size:   regular  Vitals Entered By: Duard Brady LPN (January 10, 2010 10:26 AM) CC: sx worse - productive cough, head congestion - still on abx and prednisone Is Patient Diabetic? No   CC:  sx worse - productive cough and head congestion - still on abx and prednisone.  History of Present Illness: 6  -year-old patient who was seen 3 days ago, and  treated for an acute exacerbation of COPD.  She has been on doxycycline for the past 3 days.  She is clinically worsened with increasing chest congestion and now has developed productive cough.  She feels poorly.  She has added Mucinex to her regimen.  She has cut down on her tobacco consumption.  Denies any fever or chills  Allergies: 1)  ! Pcn  Past History:  Past Medical History: Reviewed history from 06/05/2009 and no changes required. Depression Kidney Stones COPD tobacco use Low back pain penicillin allergy Osteoarthritis  Review of Systems       The patient complains of anorexia, hoarseness, and prolonged cough.  The patient denies fever, weight loss, weight gain, vision loss, decreased hearing, chest pain, syncope, dyspnea on exertion, peripheral edema, headaches, hemoptysis, abdominal pain, melena, hematochezia, severe indigestion/heartburn, hematuria, incontinence, genital sores, muscle weakness, suspicious skin lesions, transient blindness, difficulty walking, depression, unusual weight change, abnormal bleeding, enlarged lymph nodes, angioedema, and breast masses.    Physical Exam  General:  Well-developed,well-nourished,in no acute distress; alert,appropriate and cooperative throughout examination Head:  Normocephalic and atraumatic without obvious abnormalities. No apparent alopecia or balding. Eyes:  No corneal or conjunctival  inflammation noted. EOMI. Perrla. Funduscopic exam benign, without hemorrhages, exudates or papilledema. Vision grossly normal. Mouth:  Oral mucosa and oropharynx without lesions or exudates.  Teeth in good repair. Neck:  No deformities, masses, or tenderness noted. Lungs:  normal respiratory effort and no intercostal retractions.   crackles involving the right lower lung field O2 saturation 96%  Heart:  Normal rate and regular rhythm. S1 and S2 normal without gallop, murmur, click, rub or other extra sounds. pulse 66 Abdomen:  Bowel sounds positive,abdomen soft and non-tender without masses, organomegaly or hernias noted.   Impression & Recommendations:  Problem # 1:  BACTERIAL PNEUMONIA (ICD-482.9)  The following medications were removed from the medication list:    Doxycycline Hyclate 100 Mg Caps (Doxycycline hyclate) ..... One twice daily Her updated medication list for this problem includes:    Azithromycin 250 Mg Tabs (Azithromycin) .Marland Kitchen..Marland Kitchen Two tablets initially, then one daily for 4 additional days  The following medications were removed from the medication list:    Doxycycline Hyclate 100 Mg Caps (Doxycycline hyclate) ..... One twice daily Her updated medication list for this problem includes:    Azithromycin 250 Mg Tabs (Azithromycin) .Marland Kitchen..Marland Kitchen Two tablets initially, then one daily for 4 additional days  Problem # 2:  COPD (ICD-496)  Complete Medication List: 1)  Paxil Cr25 Mg Tb24 (paroxetine Hcl)  .Marland Kitchen.. 1 once daily 2)  Lorcet Plus 7.5-650 Mg Tabs (Hydrocodone-acetaminophen) .Marland Kitchen.. 1 q6h as needed 3)  Alprazolam 0.25 Mg Tabs (Alprazolam) .Marland Kitchen.. 1 two times a day as needed 4)  Aleve 220 Mg Tabs (Naproxen sodium) .... Bid 5)  Prednisone 20 Mg Tabs (Prednisone) .... One twice  daily 6)  Azithromycin 250 Mg Tabs (Azithromycin) .... Two tablets initially, then one daily for 4 additional days  Other Orders: T-2 View CXR (71020TC)  Patient Instructions: 1)  chest x-ray, as  scheduled 2)  continue Mucinex twice daily 3)  Take your antibiotic as prescribed until ALL of it is gone, but stop if you develop a rash or swelling and contact our office as soon as possible. 4)  call the office if he developed any clinical worsening chest pain or shortness of breath Prescriptions: AZITHROMYCIN 250 MG TABS (AZITHROMYCIN) two tablets initially, then one daily for 4 additional days  #6 x 0   Entered and Authorized by:   Gordy Savers  MD   Signed by:   Gordy Savers  MD on 01/10/2010   Method used:   Electronically to        [facility] (retail)       [address]       [place], [place]  [id number]       Ph: [phone]       Fax: [phone]   RxID:   [id number]

## 2010-05-07 NOTE — Progress Notes (Signed)
Summary: please advise of test results  Phone Note Call from Patient Call back at Home Phone [phone]   Caller: Patient---live call Reason for Call: Talk to Nurse, Lab or Test Results Summary of Call: please return her call about test results. Still in a lot of pain.  Initial call taken by: Warnell Forester,  June 21, 2009 10:25 AM  Follow-up for Phone Call        pt aware. referal info sent to [facility] Follow-up by: Duard Brady LPN,  June 21, 2009 3:21 PM    Please schedule ortho eval

## 2010-05-07 NOTE — Assessment & Plan Note (Signed)
Summary: cough/breathing diff/cjr   Vital Signs:  Patient profile:   55 year old female Weight:      126 pounds O2 Sat:      95 % on Room air Resp:     22 per minute BP sitting:   150 / 90  (right arm) Cuff size:   regular  Vitals Entered By: Duard Brady LPN (March 11, 2010 2:55 PM)  O2 Flow:  Room air CC: c/o congestion ,cough  sob and wheexing much worse Is Patient Diabetic? No   CC:  c/o congestion  and cough  sob and wheexing much worse.  History of Present Illness: 63 -year-old patient who has a history of COPDand tobacco use.  For the past two months, she has had considerable chest congestion, and cough.  Over the past two months.  She has tapered her smoking consumption and has been abstinent for the past 5 or 6 days.  Over this period time., she has been treated as an outpatient on a number of occasions and 3 days ago, received a 1 g of azithromycin.  A chest x-ray approximately 1 month ago revealed bronchitic changes only.  She denies any fever, chills, but has severe refractory cough and for the past few days has noted increasing wheezing.  The wheezing has been quite severe interfering with sleep.  Denies productive cough. She was seen in the office today and received nebulizer treatments.  Over 3 hours without much benefit and has had the persistent wheezing.  She is now admitted for further evaluation and management of asthmatic bronchitis  Preventive Screening-Counseling & Management  Alcohol-Tobacco     Smoking Status: current  Allergies: 1)  ! Pcn  Past History:  Past Medical History: Reviewed history from 06/05/2009 and no changes required. Depression Kidney Stones COPD tobacco use Low back pain penicillin allergy Osteoarthritis  Past Surgical History: Reviewed history from 01/05/2008 and no changes required. Appendectomy 1969 Cholecystectomy 1989 Exploratory laparotomy 1969 Collapse Lung 1990  no screening colonoscopy  Family  History: Reviewed history from 08/31/2007 and no changes required. Family History of MS Family History of Alcoholism/Addiction Family History of Arthritis Family History Diabetes 1st degree relative Family History Kidney disease Family History Lung cancer Family History of Melanoma Family History Ovarian cancer Family History of Skin cancer Family History of Stroke M 1st degree relative <50 Family History Uterine cancer Family History of Cardiovascular disorder  father died age 70,  accidental death mother died age 18, lung cancer one brother, history of MS 3 sisters; positive for RA, diabetes, thyroid disease, and hypertension  Social History: Reviewed history from 11/19/2006 and no changes required. Married Current Smoker Alcohol use-no Drug use-no Regular exercise-yes  Review of Systems       The patient complains of anorexia, hoarseness, and prolonged cough.  The patient denies fever, weight loss, weight gain, vision loss, decreased hearing, chest pain, syncope, dyspnea on exertion, peripheral edema, headaches, hemoptysis, abdominal pain, melena, hematochezia, severe indigestion/heartburn, hematuria, incontinence, genital sores, muscle weakness, suspicious skin lesions, transient blindness, difficulty walking, depression, unusual weight change, abnormal bleeding, enlarged lymph nodes, angioedema, breast masses, and testicular masses.    Physical Exam  General:  Well-developed,well-nourished,in no acute distress; alert,appropriate and cooperative throughout examination Head:  Normocephalic and atraumatic without obvious abnormalities. No apparent alopecia or balding. Eyes:  No corneal or conjunctival inflammation noted. EOMI. Perrla. Funduscopic exam benign, without hemorrhages, exudates or papilledema. Vision grossly normal. Ears:  External ear exam shows no significant lesions or  deformities.  Otoscopic examination reveals clear canals, tympanic membranes are intact  bilaterally without bulging, retraction, inflammation or discharge. Hearing is grossly normal bilaterally. Mouth:  Oral mucosa and oropharynx without lesions or exudates.  Teeth in good repair. Neck:  No deformities, masses, or tenderness noted. Chest Wall:  No deformities, masses, or tenderness noted. Lungs:  scattered expiratory wheezing scattered rhonchi mild resting tachypnea O2 saturation 96% Heart:  Normal rate and regular rhythm. S1 and S2 normal without gallop, murmur, click, rub or other extra sounds.  pulse rate 92 Abdomen:  Bowel sounds positive,abdomen soft and non-tender without masses, organomegaly or hernias noted. Msk:  No deformity or scoliosis noted of thoracic or lumbar spine.   Pulses:  R and L carotid,radial,femoral,dorsalis pedis and posterior tibial pulses are full and equal bilaterally Extremities:  No clubbing, cyanosis, edema, or deformity noted with normal full range of motion of all joints.   Skin:  Intact without suspicious lesions or rashes Cervical Nodes:  No lymphadenopathy noted Axillary Nodes:  No palpable lymphadenopathy Inguinal Nodes:  No significant adenopathy Psych:  Cognition and judgment appear intact. Alert and cooperative with normal attention span and concentration. No apparent delusions, illusions, hallucinations   Impression & Recommendations:  Problem # 1:  TOBACCO ABUSE (ICD-305.1)  Problem # 2:  COPD (ICD-496) patient has an exacerbation of COPD with asthmatic bronchitis refractory to outpatient treatment  Problem # 3:  OSTEOARTHRITIS (ICD-715.90)  Her updated medication list for this problem includes:    Lorcet Plus 7.5-650 Mg Tabs (Hydrocodone-acetaminophen) .Marland Kitchen... 1 q6h as needed    Aleve 220 Mg Tabs (Naproxen sodium) ..... Bid  Problem # 4:  DYSLIPIDEMIA (ICD-272.4)  Her updated medication list for this problem includes:    Simvastatin 40 Mg Tabs (Simvastatin) ..... Qd  Complete Medication List: 1)  Paxil Cr25 Mg Tb24  (paroxetine Hcl)  .Marland Kitchen.. 1 once daily 2)  Lorcet Plus 7.5-650 Mg Tabs (Hydrocodone-acetaminophen) .Marland Kitchen.. 1 q6h as needed 3)  Alprazolam 0.25 Mg Tabs (Alprazolam) .Marland Kitchen.. 1 two times a day as needed 4)  Aleve 220 Mg Tabs (Naproxen sodium) .... Bid 5)  Prednisone 5 Mg Tabs (Prednisone) .... One twice daily 6)  Hydrocodone-homatropine 5-1.5 Mg/33ml Syrp (Hydrocodone-homatropine) .Marland Kitchen.. 1 teaspoon  every 6 hours as needed for cough 7)  Simvastatin 40 Mg Tabs (Simvastatin) .... Qd 8)  Zithromax 1 Gm Pack (Azithromycin) .... Take as directed  Patient Instructions: 1)  will admit to the hospital for further evaluation and management 2)  will admit to [facility] team  number 8 3)  discussed with hospitalist service   Orders Added: 1)  Est. Patient Level V [16109]     Appended Document: cough/breathing diff/cjr     Allergies: 1)  ! Pcn   Complete Medication List: 1)  Paxil Cr25 Mg Tb24 (paroxetine Hcl)  .Marland Kitchen.. 1 once daily 2)  Lorcet Plus 7.5-650 Mg Tabs (Hydrocodone-acetaminophen) .Marland Kitchen.. 1 q6h as needed 3)  Alprazolam 0.25 Mg Tabs (Alprazolam) .Marland Kitchen.. 1 two times a day as needed 4)  Aleve 220 Mg Tabs (Naproxen sodium) .... Bid 5)  Prednisone 5 Mg Tabs (Prednisone) .... One twice daily 6)  Hydrocodone-homatropine 5-1.5 Mg/72ml Syrp (Hydrocodone-homatropine) .Marland Kitchen.. 1 teaspoon  every 6 hours as needed for cough 7)  Simvastatin 40 Mg Tabs (Simvastatin) .... Qd 8)  Zithromax 1 Gm Pack (Azithromycin) .... Take as directed  Other Orders: Depo- Medrol 40mg  (J1030) Admin of Therapeutic Inj  intramuscular or subcutaneous (60454)   Medication Administration  Injection # 1:  Medication: Depo- Medrol 40mg     Diagnosis: URI (ICD-465.9)    Route: IM    Site: L deltoid    Exp Date: 08/2010    Lot #: [id number]    Mfr: Pharmacia    Patient tolerated injection without complications    Given by: Duard Brady LPN (March 11, 2010 5:33 PM)  Orders Added: 1)  Depo- Medrol 40mg  [J1030] 2)  Admin of  Therapeutic Inj  intramuscular or subcutaneous [16109]

## 2010-05-07 NOTE — Assessment & Plan Note (Signed)
Summary: COUGH, CONGESTION // RS   Vital Signs:  Patient profile:   55 year old female Weight:      129 pounds Temp:     98.1 degrees F oral BP sitting:   118 / 70  (right arm) Cuff size:   regular  Vitals Entered By: Duard Brady LPN (January 07, 2010 11:05 AM)  History of Present Illness: 55 year old patient who presents with a 5 day history of head and chest congestion and nonproductive cough.  She has been on a number of oral over-the-counter medications.  There has been no fever or purulent sputum production.  She continues to smoke.  Denies any chest pain or shortness of breath.  She is anxious to improve  to visit a 10-day old grandchild  Preventive Screening-Counseling & Management  Alcohol-Tobacco     Smoking Cessation Counseling: yes  Allergies: 1)  ! Pcn  Past History:  Past Medical History: Reviewed history from 06/05/2009 and no changes required. Depression Kidney Stones COPD tobacco use Low back pain penicillin allergy Osteoarthritis  Review of Systems       The patient complains of hoarseness and prolonged cough.  The patient denies anorexia, fever, weight loss, weight gain, vision loss, decreased hearing, chest pain, syncope, dyspnea on exertion, peripheral edema, headaches, hemoptysis, abdominal pain, melena, hematochezia, severe indigestion/heartburn, hematuria, incontinence, genital sores, muscle weakness, suspicious skin lesions, transient blindness, difficulty walking, depression, unusual weight change, abnormal bleeding, enlarged lymph nodes, angioedema, and breast masses.    Physical Exam  General:  Well-developed,well-nourished,in no acute distress; alert,appropriate and cooperative throughout examination Head:  Normocephalic and atraumatic without obvious abnormalities. No apparent alopecia or balding. Eyes:  No corneal or conjunctival inflammation noted. EOMI. Perrla. Funduscopic exam benign, without hemorrhages, exudates or papilledema.  Vision grossly normal. Ears:  External ear exam shows no significant lesions or deformities.  Otoscopic examination reveals clear canals, tympanic membranes are intact bilaterally without bulging, retraction, inflammation or discharge. Hearing is grossly normal bilaterally. Mouth:  Oral mucosa and oropharynx without lesions or exudates.  Teeth in good repair. Neck:  No deformities, masses, or tenderness noted. Lungs:  Normal respiratory effort, chest expands symmetrically. Lungs are clear to auscultation, no crackles or wheezes. Heart:  Normal rate and regular rhythm. S1 and S2 normal without gallop, murmur, click, rub or other extra sounds. Abdomen:  Bowel sounds positive,abdomen soft and non-tender without masses, organomegaly or hernias noted.   Impression & Recommendations:  Problem # 1:  COPD (ICD-496)  Problem # 2:  URI (ICD-465.9)  Her updated medication list for this problem includes:    Aleve 220 Mg Tabs (Naproxen sodium) ..... Bid  Complete Medication List: 1)  Paxil Cr25 Mg Tb24 (paroxetine Hcl)  .Marland Kitchen.. 1 once daily 2)  Lorcet Plus 7.5-650 Mg Tabs (Hydrocodone-acetaminophen) .Marland Kitchen.. 1 q6h as needed 3)  Alprazolam 0.25 Mg Tabs (Alprazolam) .Marland Kitchen.. 1 two times a day as needed 4)  Aleve 220 Mg Tabs (Naproxen sodium) .... Bid 5)  Doxycycline Hyclate 100 Mg Caps (Doxycycline hyclate) .... One twice daily 6)  Prednisone 20 Mg Tabs (Prednisone) .... One twice daily  Patient Instructions: 1)  Take your antibiotic as prescribed until ALL of it is gone, but stop if you develop a rash or swelling and contact our office as soon as possible. 2)  Mucinex- twice daily 3)  Tobacco is very bad for your health and your loved ones! You Should stop smoking!. Prescriptions: PREDNISONE 20 MG TABS (PREDNISONE) one twice daily  #14 x 0  Entered and Authorized by:   Gordy Savers  MD   Signed by:   Gordy Savers  MD on 01/07/2010   Method used:   Electronically to        [facility]  [facility] (retail)       [address]       [place], [place]  [id number]       Ph: [phone]       Fax: [phone]   RxID:   [id number] DOXYCYCLINE HYCLATE 100 MG CAPS (DOXYCYCLINE HYCLATE) one twice daily  #20 x 0   Entered and Authorized by:   Gordy Savers  MD   Signed by:   Gordy Savers  MD on 01/07/2010   Method used:   Electronically to        [facility] (retail)       [address]       [place], [place]  [id number]       Ph: [phone]       Fax: [phone]   RxID:   [id number]

## 2010-05-07 NOTE — Assessment & Plan Note (Signed)
Summary: pain in arm/not any better/cjr   Vital Signs:  Patient profile:   55 year old female Weight:      127 pounds Temp:     98.5 degrees F oral BP sitting:   110 / 70  (right arm)  Vitals Entered By: Duard Brady LPN (June 11, 4096 11:03 AM) CC: c/o still with pain in (L) arm - no inprovement , not sleeping r/t pain - even with medications Is Patient Diabetic? No   CC:  c/o still with pain in (L) arm - no inprovement  and not sleeping r/t pain - even with medications.  History of Present Illness: 55 year old patient who is seen today for follow-up of left long and shoulder discomfort.  this pain has been present for about 9 days and is described as they achiness in the left posterior shoulder area associate with a burning pain down the outer aspect of the left arm in effect in the fourth and fifth fingers.  She was seen a few days ago complain of numbness involving the fourth and fifth fingers, but the numbness has resolved.  She states that she may get some minimal benefit by raising her left hand over her head.  The pain is fairly constant. One week after Christmas, she now recalls trauma in which she fell on her left arm and shoulder.  She did have left shoulder and arm pain at that time as well  as left chest wall discomfort.  Symptoms resolved after two weeks.  She has been quite uncomfortable.  She has been using analgesics, as well as naproxen  Preventive Screening-Counseling & Management  Alcohol-Tobacco     Smoking Status: current  Allergies: 1)  ! Pcn  Social History: Smoking Status:  current  Physical Exam  General:  Well-developed,well-nourished,in no acute distress; alert,appropriate and cooperative throughout examination Neck:  full range of motion without pain or discomfort Msk:  slight tenderness in involving the left posterior shoulder area laterally.  This did tend to aggravate aburning quality down the left arm  full range of motion left  shoulder Neurologic:  grip strength and  flexion and extension normal biceps and triceps reflexes are normal and symmetrical   Impression & Recommendations:  Problem # 1:  C8 RADICULOPATHY (ICD-723.4)  will schedule nerve conduction studies and depending on the results.  Consider cervical MRI  Problem # 2:  OSTEOARTHRITIS (ICD-715.90)  Her updated medication list for this problem includes:    Lorcet Plus 7.5-650 Mg Tabs (Hydrocodone-acetaminophen) .Marland Kitchen... 1 q6h as needed    Aleve 220 Mg Tabs (Naproxen sodium) ..... Bid  Her updated medication list for this problem includes:    Lorcet Plus 7.5-650 Mg Tabs (Hydrocodone-acetaminophen) .Marland Kitchen... 1 q6h as needed    Aleve 220 Mg Tabs (Naproxen sodium) ..... Bid  Complete Medication List: 1)  Paxil Cr25 Mg Tb24 (paroxetine Hcl)  .Marland Kitchen.. 1 once daily 2)  Lorcet Plus 7.5-650 Mg Tabs (Hydrocodone-acetaminophen) .Marland Kitchen.. 1 q6h as needed 3)  Alprazolam 0.25 Mg Tabs (Alprazolam) .Marland Kitchen.. 1 two times a day as needed 4)  Chantix Starting Month Pak 0.5 Mg X 11 & 1 Mg X 42 Tabs (Varenicline tartrate) .... As directed 5)  Chantix 1 Mg Tabs (Varenicline tartrate) .... One twice daily 6)  Aleve 220 Mg Tabs (Naproxen sodium) .... Bid  Other Orders: Neurology Referral (Neuro)  Patient Instructions: 1)  Cymbalta 30 mg daily for one week, then 60 mg daily 2)  continue naproxen 3)  Nerve conduction studies as  scheduled 4)  call if any worsening or weakness involving the left arm Prescriptions: LORCET PLUS 7.5-650 MG  TABS (HYDROCODONE-ACETAMINOPHEN) 1 q6h as needed  #90 x 3   Entered and Authorized by:   Gordy Savers  MD   Signed by:   Gordy Savers  MD on 06/11/2009   Method used:   Print then Give to Patient   RxID:   [id number]

## 2010-05-07 NOTE — Assessment & Plan Note (Signed)
Summary: severe pain in arm and shoulder since sunday a.m./cjr   Vital Signs:  Patient profile:   55 year old female Weight:      126 pounds Temp:     98 .9 degrees F oral BP sitting:   124 / 72  (right arm) Cuff size:   regular  Vitals Entered By: Duard Brady LPN (June 05, 8117 3:22 PM) CC: c/o pain in (L) shoulder and arm since sunday -  Is Patient Diabetic? No   CC:  c/o pain in (L) shoulder and arm since sunday - .  History of Present Illness: 55 year old patient who has a history of arthritis, who awoke two days ago with pain in the left posterior shoulder area; there has been no history of trauma.  Also describes a burning pain and sensation down the outer aspect of the left arm to the fourth and fifth fingers of the left hand.  Pain is described as constant, but does become slightly more severe at times.  There is no alleviating or aggravating factors.  Movement of the head or neck or shoulder.  Does not aggravate the discomfort.  Denies any prior history of similar discomfort.  Denies any constitutional complaints.  She is on no new medications.  She has analgesics, which are ineffective for the pain  Preventive Screening-Counseling & Management  Alcohol-Tobacco     Smoking Status: quit  Allergies: 1)  ! Pcn  Past History:  Past Medical History: Depression Kidney Stones COPD tobacco use Low back pain penicillin allergy Osteoarthritis  Social History: Smoking Status:  quit  Review of Systems  The patient denies anorexia, fever, weight loss, weight gain, vision loss, decreased hearing, hoarseness, chest pain, syncope, dyspnea on exertion, peripheral edema, prolonged cough, headaches, hemoptysis, abdominal pain, melena, hematochezia, severe indigestion/heartburn, hematuria, incontinence, genital sores, muscle weakness, suspicious skin lesions, transient blindness, difficulty walking, depression, unusual weight change, abnormal bleeding, enlarged lymph nodes,  angioedema, and breast masses.    Physical Exam  General:  Well-developed,well-nourished,in no acute distress; alert,appropriate and cooperative throughout examination Head:  Normocephalic and atraumatic without obvious abnormalities. No apparent alopecia or balding. Eyes:  No corneal or conjunctival inflammation noted. EOMI. Perrla. Funduscopic exam benign, without hemorrhages, exudates or papilledema. Vision grossly normal. Neck:  No deformities, masses, or tenderness noted. full range of motion without pain Msk:  no shoulder muscle atrophy.  Range of motion left shoulder appeared to be intact.  There is no local tenderness.  No signs of active inflammation. Neurologic:  strength normal in all extremities, sensation intact to light touch, sensation intact to pinprick, and DTRs symmetrical and normal.     Impression & Recommendations:  Problem # 1:  C8 RADICULOPATHY (ICD-723.4) symptom complex seems most compatible with a C8 radiculopathy.  Her neurologic exam is intact.  Will treat with the Depo-Medrol followed by anti-inflammatory oral medications and observe over the next several days.  She will call if she does not promptly improve  Problem # 2:  OSTEOARTHRITIS (ICD-715.90)  Her updated medication list for this problem includes:    Lorcet Plus 7.5-650 Mg Tabs (Hydrocodone-acetaminophen) .Marland Kitchen... 1 q6h as needed  Problem # 3:  PARESTHESIA (ICD-782.0)  Complete Medication List: 1)  Paxil Cr25 Mg Tb24 (paroxetine Hcl)  .Marland Kitchen.. 1 once daily 2)  Lorcet Plus 7.5-650 Mg Tabs (Hydrocodone-acetaminophen) .Marland Kitchen.. 1 q6h as needed 3)  Alprazolam 0.25 Mg Tabs (Alprazolam) .Marland Kitchen.. 1 two times a day as needed 4)  Chantix Starting Month Pak 0.5 Mg X 11 &  1 Mg X 42 Tabs (Varenicline tartrate) .... As directed 5)  Chantix 1 Mg Tabs (Varenicline tartrate) .... One twice daily  Patient Instructions: 1)  Aleve 400 mg twice daily 2)  call if your  left shoulder and arm discomfort do  not improve  Appended  Document: severe pain in arm and shoulder since sunday a.m./cjr     Allergies: 1)  ! Pcn   Complete Medication List: 1)  Paxil Cr25 Mg Tb24 (paroxetine Hcl)  .Marland Kitchen.. 1 once daily 2)  Lorcet Plus 7.5-650 Mg Tabs (Hydrocodone-acetaminophen) .Marland Kitchen.. 1 q6h as needed 3)  Alprazolam 0.25 Mg Tabs (Alprazolam) .Marland Kitchen.. 1 two times a day as needed 4)  Chantix Starting Month Pak 0.5 Mg X 11 & 1 Mg X 42 Tabs (Varenicline tartrate) .... As directed 5)  Chantix 1 Mg Tabs (Varenicline tartrate) .... One twice daily  Other Orders: Depo- Medrol 80mg  (J1040) Admin of Therapeutic Inj  intramuscular or subcutaneous (78469)    Medication Administration  Injection # 1:    Medication: Depo- Medrol 80mg     Diagnosis: C8 RADICULOPATHY (ICD-723.4)    Route: IM    Site: R deltoid    Exp Date: 02-2012    Lot #: [id number]    Mfr: Pharmacia    Patient tolerated injection without complications    Given by: Duard Brady LPN (June 05, 6293 4:19 PM)  Orders Added: 1)  Depo- Medrol 80mg  [J1040] 2)  Admin of Therapeutic Inj  intramuscular or subcutaneous [[id number]]

## 2010-05-07 NOTE — Progress Notes (Signed)
Summary: Pt needs to know date of last pneumonia vax. Pt in hospital  Phone Note Call from Patient Call back at Pls call pt is hosp at [phone]   Caller: Patient Summary of Call: Pt called and is in [facility]. Pt needs to know when she had her last pneumonia vax. Pls call pt is hosp at [phone]. Initial call taken by: Lucy Antigua,  March 12, 2010 10:38 AM  Follow-up for Phone Call        attempt to call - line busy x 3 - will try later - pnemovax given 2008. Follow-up by: Duard Brady LPN,  March 12, 2010 11:38 AM  Additional Follow-up for Phone Call Additional follow up Details #1::        spoke with pt - gave information. KIK Additional Follow-up by: Duard Brady LPN,  March 12, 2010 1:43 PM

## 2010-05-07 NOTE — Progress Notes (Signed)
Summary: Pt feeling worse. Having diff breathing. Req work in [employer] from Patient Call back at [employer] [phone]   Caller: Patient Summary of Call: Pt called and says that she is not feeling any better. Says that she is having a lot of diff breathing. 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  Initial call taken by: Lucy Antigua,  March 08, 2010 2:25 PM  Follow-up for Phone Call        please call in a prescription for generic, Z-Pak; recommend total cessation of tobacco; continue cough medication; Saturday clinic in the morning if unimproved Follow-up by: Gordy Savers  MD,  March 08, 2010 4:28 PM  Additional Follow-up for Phone Call Additional follow up Details #1::        spoke with pt - aware of med called to [facility] - if no improvment - saturday clinic. KIK Additional Follow-up by: Duard Brady LPN,  March 08, 2010 4:54 PM    New/Updated Medications: ZITHROMAX 1 GM PACK (AZITHROMYCIN) take as directed Prescriptions: ZITHROMAX 1 GM PACK (AZITHROMYCIN) take as directed  #1 pk x 0   Entered by:   Duard Brady LPN   Authorized by:   Gordy Savers  MD   Signed by:   Duard Brady LPN on 09/81/1914   Method used:   Faxed to ...       [facility] [address] [address] (retail)       [address]       [place], [place]  [id number]       Ph: [phone]       Fax: [phone]   RxID:   [id number]

## 2010-05-09 NOTE — Progress Notes (Signed)
Summary: Pt req refills for Paxil,Xanax,Lorcet,Hydrocodone Syrup  Phone Note Refill Request Call back at Home Phone [phone]   Refills Requested: Medication #1:  PAXIL CR25  MG  TB24 (PAROXETINE HCL) 1 once daily  Medication #2:  ALPRAZOLAM 0.25 MG  TABS 1 two times a day as needed  Medication #3:  LORCET PLUS 7.5-650 MG  TABS 1 q6h as needed  Medication #4:  HYDROCODONE-HOMATROPINE 5-1.5 MG/5ML SYRP 1 teaspoon  every 6 hours as needed for cough Pls call in to [facility] on [address] or pt says that you can print scripts and she will pick up. Pls notify pt.   Initial call taken by: Lucy Antigua,  March 21, 2010 3:34 PM  Follow-up for Phone Call        all meds were refilled last month with 3 RF Follow-up by: Gordy Savers  MD,  March 21, 2010 5:12 PM  Additional Follow-up for Phone Call Additional follow up Details #1::        spoke with pt - when she was in last week - she broght rx's to dicuss - pharmacy had said there were numbers missing. according to pt - Dr.Kwiatkowski discarded rx's -   pt was admitted to hospital and didn think anything more about them. KIK Additional Follow-up by: Duard Brady LPN,  March 22, 2010 11:04 AM    Prescriptions: ALPRAZOLAM 0.25 MG  TABS (ALPRAZOLAM) 1 two times a day as needed  #60 x 3   Entered and Authorized by:   Gordy Savers  MD   Signed by:   Gordy Savers  MD on 03/22/2010   Method used:   Print then Give to Patient   RxID:   [id number] LORCET PLUS 7.5-650 MG  TABS (HYDROCODONE-ACETAMINOPHEN) 1 q6h as needed  #90 x 3   Entered and Authorized by:   Gordy Savers  MD   Signed by:   Gordy Savers  MD on 03/22/2010   Method used:   Print then Give to Patient   RxID:   [id number] PAXIL CR25  MG  TB24 (PAROXETINE HCL) 1 once daily  #90 x 6   Entered and Authorized by:   Gordy Savers  MD   Signed by:   Gordy Savers  MD on 03/22/2010   Method used:   Print then  Give to Patient   RxID:   [id number]   Appended Document: Pt req refills for Paxil,Xanax,Lorcet,Hydrocodone Syrup pt aware rx's ready for pick up. KIK

## 2010-05-09 NOTE — Assessment & Plan Note (Signed)
Summary: per dr alva/cb   Visit Type:  Hospital Follow-up Primary Kim [name]/Referring Obera Stauch:  Gordy Savers  MD  CC:  Pt states no complaints.  History of Present Illness: 55/F , smoker adm 12/5-8/11 for COPD exacerbation, improved with steroids & avelox.  March 27, 2010 3:04 PM  -post hospital FU smokde last 3 days 2 cigs/d - guilty about relapse, tearful,  otherwise back to baseline Stoppped symbicort last week due to bumps in her mouth Not needed albuterol rescue, on last few tabs of prednisone. Spirometry >> FEv1 59%, small airways 27 %  Preventive Screening-Counseling & Management  Alcohol-Tobacco     Alcohol drinks/day: 0     Smoking Status: current     Packs/Day: <0.25     Year Started: 1982  Current Medications (verified): 1)  Paxil Cr 25 Mg Xr24h-Tab (Paroxetine Hcl) .... Take 1 Tablet By Mouth Once A Day 2)  Lorcet Plus 7.5-650 Mg  Tabs (Hydrocodone-Acetaminophen) .Marland Kitchen.. 1 Q6h As Needed 3)  Alprazolam 0.25 Mg  Tabs (Alprazolam) .Marland Kitchen.. 1 Two Times A Day As Needed 4)  Aleve 220 Mg Tabs (Naproxen Sodium) .... Two Times A Day As Needed 5)  Prednisone 5 Mg Tabs (Prednisone) .... One Twice Daily 6)  Hydrocodone-Homatropine 5-1.5 Mg/57ml Syrp (Hydrocodone-Homatropine) .Marland Kitchen.. 1 Teaspoon  Every 6 Hours As Needed For Cough 7)  Simvastatin 40 Mg Tabs (Simvastatin) .... ****not Started**** Take 1 Tablet By Mouth Once A Day  Allergies (verified): 1)  ! Pcn  Past History:  Past Medical History: Last updated: 06/05/2009 Depression Kidney Stones COPD tobacco use Low back pain penicillin allergy Osteoarthritis  Social History: Last updated: 11/19/2006 Married Current Smoker Alcohol use-no Drug use-no Regular exercise-yes  Social History: Packs/Day:  <0.25 Alcohol drinks/day:  0  Review of Systems       The patient complains of dyspnea on exertion.  The patient denies anorexia, fever, weight loss, weight gain, vision loss, decreased hearing, hoarseness,  chest pain, syncope, peripheral edema, prolonged cough, headaches, hemoptysis, abdominal pain, melena, hematochezia, severe indigestion/heartburn, hematuria, muscle weakness, suspicious skin lesions, transient blindness, difficulty walking, depression, unusual weight change, abnormal bleeding, enlarged lymph nodes, and angioedema.    Vital Signs:  Patient profile:   55 year old female Height:      62 inches Weight:      127.2 pounds BMI:     23.35 O2 Sat:      96 % on Room air Temp:     98.2 degrees F oral Pulse rate:   84 / minute BP sitting:   124 / 66  (right arm) Cuff size:   regular  Vitals Entered By: Zackery Barefoot CMA (March 27, 2010 2:38 PM)  O2 Flow:  Room air CC: Pt states no complaints Comments Medications reviewed with patient Verified contact number and pharmacy with patient Zackery Barefoot CMA  March 27, 2010 2:39 PM    Physical Exam  Additional Exam:  [place], well-nourished, in no distress ENT - no lesions, no post nasal drip Neck: No JVD, no thyromegaly, no carotid bruits Lungs: no use of accessory muscles, no dullness to percussion, clear without rales or rhonchi  Cardiovascular: Rhythm regular, heart sounds  normal, no murmurs or gallops, no peripheral edema Musculoskeletal: No deformities, no cyanosis or clubbing      Impression & Recommendations:  Problem # 1:  TOBACCO ABUSE (ICD-305.1)  FOcussed on this for the major part of visit Used spirometry data to re-inforce quit ateempt. Discussed nicotine aids- patches or inhaler  Orders: Est. Patient Level III (24401) Spirometry w/Graph (94010)  Problem # 2:  COPD (ICD-496) recent exacerbation resolved OK to taper off prednisone Trial of spiriva- discussed adverse effects.  Medications Added to Medication List This Visit: 1)  Paxil Cr 25 Mg Xr24h-tab (Paroxetine hcl) .... Take 1 tablet by mouth once a day 2)  Aleve 220 Mg Tabs (Naproxen sodium) .... Two times a day as needed 3)   Simvastatin 40 Mg Tabs (Simvastatin) .... ****not started**** take 1 tablet by mouth once a day  Patient Instructions: 1)  Copy sent to: 2)  Please schedule a follow-up appointment in 1 month. 3)  Your lung capacity is at 60% 4)  Use nicotine patches  5)  Trial of spiriva - willc ause dryness   Immunization History:  Influenza Immunization History:    Influenza:  historical (03/13/2010)

## 2010-05-17 ENCOUNTER — Ambulatory Visit: Payer: Self-pay | Admitting: Pulmonary Disease

## 2010-06-18 LAB — COMPREHENSIVE METABOLIC PANEL
ALT: 15 U/L (ref 0–35)
AST: 22 U/L (ref 0–37)
Albumin: 4.1 g/dL (ref 3.5–5.2)
Alkaline Phosphatase: 81 U/L (ref 39–117)
BUN: 11 mg/dL (ref 6–23)
CO2: 28 mEq/L (ref 19–32)
Calcium: 9.7 mg/dL (ref 8.4–10.5)
Chloride: 106 mEq/L (ref 96–112)
Creatinine, Ser: 0.95 mg/dL (ref 0.4–1.2)
GFR calc Af Amer: >60 mL/min (ref >60)
GFR calc non Af Amer: >60 mL/min (ref >60)
Glucose, Bld: 109 mg/dL — ABNORMAL HIGH (ref 70–99)
Potassium: 3.9 mEq/L (ref 3.5–5.1)
Sodium: 141 mEq/L (ref 135–145)
Total Bilirubin: 0.2 mg/dL — ABNORMAL LOW (ref 0.3–1.2)
Total Protein: 6.9 g/dL (ref 6.0–8.3)

## 2010-06-18 LAB — CBC
HCT: 38.4 % (ref 36.0–46.0)
HCT: 43.4 % (ref 36.0–46.0)
Hemoglobin: 12.5 g/dL (ref 12.0–15.0)
Hemoglobin: 14.5 g/dL (ref 12.0–15.0)
MCH: 30.6 pg (ref 26.0–34.0)
MCH: 31.4 pg (ref 26.0–34.0)
MCHC: 32.6 g/dL (ref 30.0–36.0)
MCHC: 33.4 g/dL (ref 30.0–36.0)
MCV: 93.9 fL (ref 78.0–100.0)
MCV: 94.1 fL (ref 78.0–100.0)
Platelets: 263 10*3/uL (ref 150–400)
Platelets: 264 10*3/uL (ref 150–400)
RBC: 4.08 MIL/uL (ref 3.87–5.11)
RBC: 4.62 MIL/uL (ref 3.87–5.11)
RDW: 13.9 % (ref 11.5–15.5)
RDW: 14.3 % (ref 11.5–15.5)
WBC: 11.3 10*3/uL — ABNORMAL HIGH (ref 4.0–10.5)
WBC: 22.3 10*3/uL — ABNORMAL HIGH (ref 4.0–10.5)

## 2010-06-18 LAB — BASIC METABOLIC PANEL
BUN: 14 mg/dL (ref 6–23)
CO2: 25 mEq/L (ref 19–32)
Calcium: 9.5 mg/dL (ref 8.4–10.5)
Chloride: 109 mEq/L (ref 96–112)
Creatinine, Ser: 0.75 mg/dL (ref 0.4–1.2)
GFR calc Af Amer: >60 mL/min (ref >60)
GFR calc non Af Amer: >60 mL/min (ref >60)
Glucose, Bld: 138 mg/dL — ABNORMAL HIGH (ref 70–99)
Potassium: 4.5 mEq/L (ref 3.5–5.1)
Sodium: 143 mEq/L (ref 135–145)

## 2010-06-18 LAB — DIFFERENTIAL
Basophils Absolute: 0.1 10*3/uL (ref 0.0–0.1)
Basophils Relative: 1 % (ref 0–1)
Eosinophils Absolute: 0.2 10*3/uL (ref 0.0–0.7)
Eosinophils Relative: 1 % (ref 0–5)
Lymphocytes Relative: 27 % (ref 12–46)
Lymphs Abs: 3 10*3/uL (ref 0.7–4.0)
Monocytes Absolute: 0.5 10*3/uL (ref 0.1–1.0)
Monocytes Relative: 4 % (ref 3–12)
Neutro Abs: 7.6 10*3/uL (ref 1.7–7.7)
Neutrophils Relative %: 67 % (ref 43–77)

## 2010-06-28 ENCOUNTER — Encounter: Payer: Self-pay | Admitting: Internal Medicine

## 2010-07-01 ENCOUNTER — Ambulatory Visit: Payer: Self-pay | Admitting: Internal Medicine

## 2010-07-01 DIAGNOSIS — Z0289 Encounter for other administrative examinations: Secondary | ICD-10-CM

## 2010-08-23 NOTE — Assessment & Plan Note (Signed)
[facility] OFFICE NOTE   NAME:Kim [name], Kim [name]                      MRN:          [id number]  DATE:06/04/2006                            DOB:          February 15, 1956    A 55 year old female seen today to establish with our practice.  She has  a long history of depression.  A number of concerns today.  One is  ongoing tobacco use and wishing to try Chantix and also to establish  with a PCP.  She requires a new refill for Paxil.  A urgent care  physician increased her dose last fall, which she feels is too strong.  She has a history of kidney stones, status post lithotripsy in the 80s  without recurrence.  She has low back pain.  She sees Dr. Ethelene Hal  occasionally.   She has had a number of surgical procedures, including chest tube for  pneumothorax in 1990.  She had a cholecystectomy in 1989, appendectomy  in 1969, and exploratory laparotomy in 1969 for an ovarian cyst.   She is a one-pack-per day smoker.   Has a PENICILLIN allergy.   SOCIAL HISTORY:  She is married.  Two sons.  Two grandchildren.   FAMILY HISTORY:  Mother died of lung cancer at 13.  Non-tobacco user.  Father died at 32 from accidental death.  One brother has MS.  Three  sisters.  Positive for obesity, RA, thyroid disease, two with  thyroidectomies, diabetes, and hypertension.   PHYSICAL EXAMINATION:  GENERAL:  A thin white female in no acute  distress.  VITAL SIGNS:  Blood pressure 118/78.  HEENT:  Fundi, ears, nose, and throat clear.  She did have a tiny  papular lesion, 1-2 mm, involving her right lower lid.  NECK:  Supple.  No adenopathy or bruits.  No thyroid enlargement.  CHEST:  Clear.  BREASTS:  Negative.  CARDIOVASCULAR:  Normal heart sounds.  No murmurs.  ABDOMEN:  Benign.  No organomegaly.  EXTREMITIES:  No edema.  Peripheral pulses full.   IMPRESSION:  1. Situational depression.  2. Low back pain.  3. History of kidney  stones.  4. Ongoing tobacco use.   DISPOSITION:  She wishes to a trial of Chantix.  A prescription was  dispensed.  A recheck in six months.     Gordy Savers, MD  Electronically Signed    PFK/MedQ  DD: 06/04/2006  DT: 06/04/2006  Job #: [id number]

## 2010-11-27 ENCOUNTER — Other Ambulatory Visit: Payer: Self-pay | Admitting: Internal Medicine

## 2010-11-27 MED ORDER — HYDROCODONE-ACETAMINOPHEN 7.5-650 MG PO TABS: 1.0000 | ORAL_TABLET | Freq: Four times a day (QID) | ORAL | Status: DC | PRN

## 2010-11-27 NOTE — Telephone Encounter (Signed)
#  90   Needs ROV this fall

## 2010-11-27 NOTE — Telephone Encounter (Signed)
Last seen 03/11/10 . Last written 03/22/10 #90 3RF Please advise

## 2010-11-27 NOTE — Telephone Encounter (Signed)
rx ready for pickup 

## 2010-11-27 NOTE — Telephone Encounter (Signed)
Pt called req refill of hydrocodone-acetaminophen (LORCET PLUS) 7.5-650 MG per tablet. Pt req to pick up script tomorrow 11/28/10 around  11am

## 2011-01-31 ENCOUNTER — Encounter: Payer: Self-pay | Admitting: Internal Medicine

## 2011-01-31 ENCOUNTER — Ambulatory Visit (INDEPENDENT_AMBULATORY_CARE_PROVIDER_SITE_OTHER): Payer: 59 | Admitting: Internal Medicine

## 2011-01-31 VITALS — BP 110/70 | HR 68 | Temp 98.1°F | Resp 16 | Ht 62.5 in | Wt 128.0 lb

## 2011-01-31 DIAGNOSIS — Z0000 Encounter for general adult medical examination without abnormal findings: Secondary | ICD-10-CM

## 2011-01-31 DIAGNOSIS — E785 Hyperlipidemia, unspecified: Secondary | ICD-10-CM

## 2011-01-31 DIAGNOSIS — Z23 Encounter for immunization: Secondary | ICD-10-CM

## 2011-01-31 DIAGNOSIS — F329 Major depressive disorder, single episode, unspecified: Secondary | ICD-10-CM

## 2011-01-31 DIAGNOSIS — F17200 Nicotine dependence, unspecified, uncomplicated: Secondary | ICD-10-CM

## 2011-01-31 LAB — CBC WITH DIFFERENTIAL/PLATELET
Basophils Absolute: 0.1 10*3/uL (ref 0.0–0.1)
Basophils Relative: 0.6 % (ref 0.0–3.0)
Eosinophils Absolute: 0.1 10*3/uL (ref 0.0–0.7)
Eosinophils Relative: 1.3 % (ref 0.0–5.0)
HCT: 38.9 % (ref 36.0–46.0)
Hemoglobin: 13.1 g/dL (ref 12.0–15.0)
Lymphocytes Relative: 35.4 % (ref 12.0–46.0)
Lymphs Abs: 3 10*3/uL (ref 0.7–4.0)
MCHC: 33.7 g/dL (ref 30.0–36.0)
MCV: 93.5 fl (ref 78.0–100.0)
Monocytes Absolute: 0.6 10*3/uL (ref 0.1–1.0)
Monocytes Relative: 6.6 % (ref 3.0–12.0)
Neutro Abs: 4.8 10*3/uL (ref 1.4–7.7)
Neutrophils Relative %: 56.1 % (ref 43.0–77.0)
Platelets: 227 10*3/uL (ref 150.0–400.0)
RBC: 4.16 Mil/uL (ref 3.87–5.11)
RDW: 14.1 % (ref 11.5–14.6)
WBC: 8.5 10*3/uL (ref 4.5–10.5)

## 2011-01-31 LAB — LIPID PANEL
Cholesterol: 209 mg/dL — ABNORMAL HIGH (ref 0–200)
HDL: 43.1 mg/dL (ref >39.00)
Total CHOL/HDL Ratio: 5
Triglycerides: 118 mg/dL (ref 0.0–149.0)
VLDL: 23.6 mg/dL (ref 0.0–40.0)

## 2011-01-31 LAB — COMPREHENSIVE METABOLIC PANEL
ALT: 11 U/L (ref 0–35)
AST: 14 U/L (ref 0–37)
Albumin: 4.2 g/dL (ref 3.5–5.2)
Alkaline Phosphatase: 73 U/L (ref 39–117)
BUN: 14 mg/dL (ref 6–23)
CO2: 27 mEq/L (ref 19–32)
Calcium: 9.1 mg/dL (ref 8.4–10.5)
Chloride: 110 mEq/L (ref 96–112)
Creatinine, Ser: 0.7 mg/dL (ref 0.4–1.2)
GFR: 93.21 mL/min (ref >60.00)
Glucose, Bld: 92 mg/dL (ref 70–99)
Potassium: 3.8 mEq/L (ref 3.5–5.1)
Sodium: 143 mEq/L (ref 135–145)
Total Bilirubin: 0.6 mg/dL (ref 0.3–1.2)
Total Protein: 6.9 g/dL (ref 6.0–8.3)

## 2011-01-31 LAB — TSH: TSH: 0.85 u[IU]/mL (ref 0.35–5.50)

## 2011-01-31 MED ORDER — PAROXETINE HCL ER 25 MG PO TB24: 25.0000 mg | ORAL_TABLET | ORAL | Status: DC

## 2011-01-31 MED ORDER — SIMVASTATIN 40 MG PO TABS: 40.0000 mg | ORAL_TABLET | Freq: Every day | ORAL | Status: DC

## 2011-01-31 MED ORDER — ALPRAZOLAM 0.25 MG PO TABS: 0.2500 mg | ORAL_TABLET | Freq: Two times a day (BID) | ORAL | Status: DC | PRN

## 2011-01-31 MED ORDER — HYDROCODONE-ACETAMINOPHEN 7.5-650 MG PO TABS: 1.0000 | ORAL_TABLET | Freq: Four times a day (QID) | ORAL | Status: DC | PRN

## 2011-01-31 NOTE — Patient Instructions (Signed)
Smoking tobacco is very bad for your health. You should stop smoking immediately.  Schedule your colonoscopy to help detect colon cancer.    It is important that you exercise regularly, at least 20 minutes 3 to 4 times per week.  If you develop chest pain or shortness of breath seek  medical attention.  Return in one year for follow-up

## 2011-01-31 NOTE — Progress Notes (Signed)
Subjective:    Patient ID: Kim [name], female    DOB: 07/14/1955, 55 y.o.   MRN: [id number]  HPI  History of Present Illness:   55 y/o female, who is seen today for a preventative health examination. Medical issues include chronic bronchitis, and ongoing tobacco use. She has a history of depression, anxiety, osteoarthritis, and some chronic low back pain. She has a history of dyslipidemia but has been reluctant to take statin therapy in the past she has been given prescriptions that she has failed to refill. She is doing quite well today no significant back pain. She has given herself trials off paroxetine and has done poorly. She is a sporadic smoker and often goes weeks without tobacco use. She is finally agreeable for a screening colonoscopy and this will be set up today  Preventive Screening-Counseling & Management   Alcohol-Tobacco  Smoking Cessation Counseling: yes   Allergies:  1) ! Pcn   Past History:  Past Medical History:  Reviewed history from 06/05/2009 and no changes required.  Depression  Kidney Stones  COPD  tobacco use  Low back pain  penicillin allergy  Osteoarthritis   Past Surgical History:  Reviewed history from 01/05/2008 and no changes required.  Appendectomy 1969  Cholecystectomy 1989  Exploratory laparotomy 1969  Collapse Lung 1990  no screening colonoscopy   Family History:  Reviewed history from 08/31/2007 and no changes required.  Family History of MS  Family History of Alcoholism/Addiction  Family History of Arthritis  Family History Diabetes 1st degree relative  Family History Kidney disease  Family History Lung cancer  Family History of Melanoma  Family History Ovarian cancer  Family History of Skin cancer  Family History of Stroke M 1st degree relative <50  Family History Uterine cancer  Family History of Cardiovascular disorder  father died age 48, accidental death  mother died age 62, lung cancer  one brother, history of MS    3 sisters; positive for RA, diabetes, thyroid disease, and hypertension   Social History:  Reviewed history from 11/19/2006 and no changes required.  Married  Current Smoker  Alcohol use-no  Drug use-no  Regular exercise-yes      Review of Systems  Constitutional: Negative for fever, appetite change, fatigue and unexpected weight change.  HENT: Negative for hearing loss, ear pain, nosebleeds, congestion, sore throat, mouth sores, trouble swallowing, neck stiffness, dental problem, voice change, sinus pressure and tinnitus.   Eyes: Negative for photophobia, pain, redness and visual disturbance.  Respiratory: Negative for cough, chest tightness and shortness of breath.   Cardiovascular: Negative for chest pain, palpitations and leg swelling.  Gastrointestinal: Negative for nausea, vomiting, abdominal pain, diarrhea, constipation, blood in stool, abdominal distention and rectal pain.  Genitourinary: Negative for dysuria, urgency, frequency, hematuria, flank pain, vaginal bleeding, vaginal discharge, difficulty urinating, genital sores, vaginal pain, menstrual problem and pelvic pain.  Musculoskeletal: Negative for back pain and arthralgias.  Skin: Negative for rash.  Neurological: Negative for dizziness, syncope, speech difficulty, weakness, light-headedness, numbness and headaches.  Hematological: Negative for adenopathy. Does not bruise/bleed easily.  Psychiatric/Behavioral: Negative for suicidal ideas, behavioral problems, self-injury, dysphoric mood and agitation. The patient is not nervous/anxious.        Objective:   Physical Exam  Constitutional: She is oriented to person, place, and time. She appears well-developed and well-nourished.  HENT:  Head: Normocephalic and atraumatic.  Right Ear: External ear normal.  Left Ear: External ear normal.  Mouth/Throat: Oropharynx is clear and moist.  Eyes: Conjunctivae and EOM are normal.  Neck: Normal range of motion. Neck supple. No  JVD present. No thyromegaly present.  Cardiovascular: Normal rate, regular rhythm, normal heart sounds and intact distal pulses.   No murmur heard. Pulmonary/Chest: Effort normal and breath sounds normal. She has no wheezes. She has no rales.  Abdominal: Soft. Bowel sounds are normal. She exhibits no distension and no mass. There is no tenderness. There is no rebound and no guarding.  Musculoskeletal: Normal range of motion. She exhibits no edema and no tenderness.  Neurological: She is alert and oriented to person, place, and time. She has normal reflexes. No cranial nerve deficit. She exhibits normal muscle tone. Coordination normal.  Skin: Skin is warm and dry. No rash noted.  Psychiatric: She has a normal mood and affect. Her behavior is normal.          Assessment & Plan:   Preventive health examination we will set up for fasting lab today we'll set up for a screening colonoscopy medications refilled Tobacco use total smoking cessation encouraged Dyslipidemia. We'll check a lipid profile  Regular exercise encouraged. Will recheck in one year

## 2011-07-03 ENCOUNTER — Ambulatory Visit (INDEPENDENT_AMBULATORY_CARE_PROVIDER_SITE_OTHER): Payer: 59 | Admitting: Internal Medicine

## 2011-07-03 ENCOUNTER — Encounter: Payer: Self-pay | Admitting: Internal Medicine

## 2011-07-03 VITALS — BP 120/80 | Temp 97.8°F | Wt 130.0 lb

## 2011-07-03 DIAGNOSIS — J069 Acute upper respiratory infection, unspecified: Secondary | ICD-10-CM

## 2011-07-03 MED ORDER — SIMVASTATIN 40 MG PO TABS: 40.0000 mg | ORAL_TABLET | Freq: Every day | ORAL | Status: DC

## 2011-07-03 MED ORDER — HYDROCODONE-ACETAMINOPHEN 7.5-650 MG PO TABS: 1.0000 | ORAL_TABLET | Freq: Four times a day (QID) | ORAL | Status: DC | PRN

## 2011-07-03 MED ORDER — PAROXETINE HCL ER 25 MG PO TB24: 25.0000 mg | ORAL_TABLET | ORAL | Status: DC

## 2011-07-03 MED ORDER — ALPRAZOLAM 0.25 MG PO TABS: 0.2500 mg | ORAL_TABLET | Freq: Two times a day (BID) | ORAL | Status: DC | PRN

## 2011-07-03 NOTE — Patient Instructions (Signed)
Allegra-D twice daily andsinus   Use saline irrigation, warm  moist compresses and over-the-counter decongestants only as directed.  Call if there is no improvement in 5 to 7 days, or sooner if you develop increasing pain, fever, or any new symptoms.

## 2011-07-03 NOTE — Progress Notes (Signed)
  Subjective:    Patient ID: Kim [name], female    DOB: July 01, 1955, 56 y.o.   MRN: [id number]  HPI  56 year old patient who presents with a several-day history of fullness in the right ear and sinus congestion. There's been no fever she has had symptoms for about one week. No cough shortness of breath. She is a former tobacco user but has discontinued.    Review of Systems  Constitutional: Negative.   HENT: Positive for congestion and sinus pressure. Negative for hearing loss, sore throat, rhinorrhea, dental problem and tinnitus.   Eyes: Negative for pain, discharge and visual disturbance.  Respiratory: Negative for cough and shortness of breath.   Cardiovascular: Negative for chest pain, palpitations and leg swelling.  Gastrointestinal: Negative for nausea, vomiting, abdominal pain, diarrhea, constipation, blood in stool and abdominal distention.  Genitourinary: Negative for dysuria, urgency, frequency, hematuria, flank pain, vaginal bleeding, vaginal discharge, difficulty urinating, vaginal pain and pelvic pain.  Musculoskeletal: Negative for joint swelling, arthralgias and gait problem.  Skin: Negative for rash.  Neurological: Negative for dizziness, syncope, speech difficulty, weakness, numbness and headaches.  Hematological: Negative for adenopathy.  Psychiatric/Behavioral: Negative for behavioral problems, dysphoric mood and agitation. The patient is not nervous/anxious.        Objective:   Physical Exam  Constitutional: She is oriented to person, place, and time. She appears well-developed and well-nourished.  HENT:  Head: Normocephalic.  Right Ear: External ear normal.  Left Ear: External ear normal.  Mouth/Throat: Oropharynx is clear and moist.       The right tympanic membrane is slightly dull Weber lateralizes to the right  Eyes: Conjunctivae and EOM are normal. Pupils are equal, round, and reactive to light.  Neck: Normal range of motion. Neck supple. No thyromegaly  present.  Cardiovascular: Normal rate, regular rhythm, normal heart sounds and intact distal pulses.   Pulmonary/Chest: Effort normal and breath sounds normal.  Abdominal: Soft. Bowel sounds are normal. She exhibits no mass. There is no tenderness.  Musculoskeletal: Normal range of motion.  Lymphadenopathy:    She has no cervical adenopathy.  Neurological: She is alert and oriented to person, place, and time.  Skin: Skin is warm and dry. No rash noted.  Psychiatric: She has a normal mood and affect. Her behavior is normal.          Assessment & Plan:   URI with serous otitis media  We'll treat with decongestants expectorants

## 2011-09-03 ENCOUNTER — Encounter: Payer: Self-pay | Admitting: Internal Medicine

## 2011-09-03 ENCOUNTER — Ambulatory Visit (INDEPENDENT_AMBULATORY_CARE_PROVIDER_SITE_OTHER): Payer: 59 | Admitting: Internal Medicine

## 2011-09-03 VITALS — BP 102/70 | Temp 98.0°F | Wt 132.0 lb

## 2011-09-03 DIAGNOSIS — L723 Sebaceous cyst: Secondary | ICD-10-CM

## 2011-09-03 DIAGNOSIS — E785 Hyperlipidemia, unspecified: Secondary | ICD-10-CM

## 2011-09-03 DIAGNOSIS — F17200 Nicotine dependence, unspecified, uncomplicated: Secondary | ICD-10-CM

## 2011-09-03 DIAGNOSIS — J449 Chronic obstructive pulmonary disease, unspecified: Secondary | ICD-10-CM

## 2011-09-03 NOTE — Progress Notes (Signed)
  Subjective:    Patient ID: Kim [name], female    DOB: [birth year], 56 y.o.   MRN: [id number]  HPI  56 year old patient who has a history of mild COPD. Unfortunately she has resumed low-volume smoking. She has dyslipidemia which has been controlled on simvastatin 40 mg daily. She presents with a chief complaint of a nodule involving her left elbow region.    Review of Systems  Skin: Positive for rash.       Objective:   Physical Exam  Constitutional: She appears well-developed and well-nourished. No distress.  HENT:       Right tympanic membrane is now normal  Pulmonary/Chest: No respiratory distress. She has no wheezes. She has no rales.  Skin:       A small approximate 1 cm noninflamed nontender nodule just distal to the left elbow region          Assessment & Plan:   Sebaceous cyst left elbow region. The patient was reassured we'll consider I&D if this becomes larger or inflamed Dyslipidemia. Continue simvastatin 40 Tobacco use. Total cessation of smoking encouraged

## 2011-09-03 NOTE — Patient Instructions (Signed)
Call or return to clinic prn if these symptoms worsen or fail to improve as anticipated.  Smoking tobacco is very bad for your health. You should stop smoking immediately. 

## 2012-01-20 ENCOUNTER — Other Ambulatory Visit (INDEPENDENT_AMBULATORY_CARE_PROVIDER_SITE_OTHER): Payer: 59

## 2012-01-20 DIAGNOSIS — Z0000 Encounter for general adult medical examination without abnormal findings: Secondary | ICD-10-CM

## 2012-01-20 LAB — POCT URINALYSIS DIPSTICK
Bilirubin, UA: NEGATIVE
Glucose, UA: NEGATIVE
Ketones, UA: NEGATIVE
Leukocytes, UA: NEGATIVE
Nitrite, UA: NEGATIVE
Protein, UA: NEGATIVE
Spec Grav, UA: >=1.03
Urobilinogen, UA: 0.2
pH, UA: 5.5

## 2012-01-20 LAB — CBC WITH DIFFERENTIAL/PLATELET
Basophils Absolute: 0.1 10*3/uL (ref 0.0–0.1)
Hemoglobin: 13.2 g/dL (ref 12.0–15.0)
Lymphocytes Relative: 45.7 % (ref 12.0–46.0)
Monocytes Relative: 6.7 % (ref 3.0–12.0)
Neutro Abs: 3.6 10*3/uL (ref 1.4–7.7)
Platelets: 249 10*3/uL (ref 150.0–400.0)
RDW: 14.2 % (ref 11.5–14.6)

## 2012-01-20 LAB — BASIC METABOLIC PANEL WITH GFR
BUN: 19 mg/dL (ref 6–23)
CO2: 25 meq/L (ref 19–32)
Calcium: 9.2 mg/dL (ref 8.4–10.5)
Chloride: 107 meq/L (ref 96–112)
Creatinine, Ser: 0.7 mg/dL (ref 0.4–1.2)
GFR: 94.47 mL/min
Glucose, Bld: 95 mg/dL (ref 70–99)
Potassium: 3.8 meq/L (ref 3.5–5.1)
Sodium: 140 meq/L (ref 135–145)

## 2012-01-20 LAB — LIPID PANEL
Cholesterol: 177 mg/dL (ref 0–200)
HDL: 37.1 mg/dL — ABNORMAL LOW (ref >39.00)
VLDL: 9 mg/dL (ref 0.0–40.0)

## 2012-01-20 LAB — HEPATIC FUNCTION PANEL
AST: 17 U/L (ref 0–37)
Alkaline Phosphatase: 69 U/L (ref 39–117)
Bilirubin, Direct: 0.1 mg/dL (ref 0.0–0.3)
Total Bilirubin: 0.4 mg/dL (ref 0.3–1.2)

## 2012-01-27 ENCOUNTER — Ambulatory Visit (INDEPENDENT_AMBULATORY_CARE_PROVIDER_SITE_OTHER): Payer: 59 | Admitting: Internal Medicine

## 2012-01-27 ENCOUNTER — Encounter: Payer: Self-pay | Admitting: Internal Medicine

## 2012-01-27 VITALS — BP 124/80 | HR 82 | Temp 98.2°F | Resp 18 | Ht 62.0 in | Wt 121.0 lb

## 2012-01-27 DIAGNOSIS — J449 Chronic obstructive pulmonary disease, unspecified: Secondary | ICD-10-CM

## 2012-01-27 DIAGNOSIS — Z23 Encounter for immunization: Secondary | ICD-10-CM

## 2012-01-27 DIAGNOSIS — J4489 Other specified chronic obstructive pulmonary disease: Secondary | ICD-10-CM

## 2012-01-27 DIAGNOSIS — F329 Major depressive disorder, single episode, unspecified: Secondary | ICD-10-CM

## 2012-01-27 DIAGNOSIS — F17200 Nicotine dependence, unspecified, uncomplicated: Secondary | ICD-10-CM

## 2012-01-27 DIAGNOSIS — F3289 Other specified depressive episodes: Secondary | ICD-10-CM

## 2012-01-27 DIAGNOSIS — E785 Hyperlipidemia, unspecified: Secondary | ICD-10-CM

## 2012-01-27 DIAGNOSIS — Z0000 Encounter for general adult medical examination without abnormal findings: Secondary | ICD-10-CM

## 2012-01-27 MED ORDER — HYDROCODONE-ACETAMINOPHEN 7.5-650 MG PO TABS: 1.0000 | ORAL_TABLET | Freq: Four times a day (QID) | ORAL | Status: DC | PRN

## 2012-01-27 MED ORDER — ALPRAZOLAM 0.25 MG PO TABS: 0.2500 mg | ORAL_TABLET | Freq: Two times a day (BID) | ORAL | Status: DC | PRN

## 2012-01-27 MED ORDER — BUPROPION HCL ER (XL) 150 MG PO TB24: 150.0000 mg | ORAL_TABLET | Freq: Every day | ORAL | Status: DC

## 2012-01-27 MED ORDER — PAROXETINE HCL ER 25 MG PO TB24: 25.0000 mg | ORAL_TABLET | ORAL | Status: DC

## 2012-01-27 NOTE — Patient Instructions (Signed)
It is important that you exercise regularly, at least 20 minutes 3 to 4 times per week.  If you develop chest pain or shortness of breath seek  medical attention.  Schedule your mammogram.  Take a calcium supplement, plus [phone] units of vitamin D  Schedule your colonoscopy to help detect colon cancer.  Smoking tobacco is very bad for your health. You should stop smoking immediately.  Return in one year for follow-up

## 2012-01-27 NOTE — Progress Notes (Signed)
Subjective:    Patient ID: Kim [name], female    DOB: 1955-12-21, 56 y.o.   MRN: [id number]  HPI  56 year old patient who is seen today for a wellness exam. She continues to smoke but is down to approximately 4 cigarettes daily. She has a history of dyslipidemia and has been very reluctant to consider statin therapy. She has been working on her diet and her lipid profile has shown very nice improvement. She has a history of anxiety depression which has been fairly stable she has been under some situational stress due to the poor health of her sister-in-law. She has osteoarthritis.  Past Medical History  Diagnosis Date  . C8 RADICULOPATHY 06/05/2009  . COPD 08/31/2007  . DEPRESSION 11/19/2006  . DYSLIPIDEMIA 03/12/2009  . 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 OSTEOARTHRITIS 06/05/2009  . PARESTHESIA 08/24/2007  . TOBACCO ABUSE 01/25/2010    History   Social History  . Marital Status: Married    Spouse Name: N/A    Number of Children: N/A  . Years of Education: N/A   Occupational History  . Not on file.   Social History Main Topics  . Smoking status: Current Every Day Smoker    Types: Cigarettes  . Smokeless tobacco: Never Used  . Alcohol Use: No  . Drug Use: No  . Sexually Active: Not on file   Other Topics Concern  . Not on file   Social History Narrative  . No narrative on file    Past Surgical History  Procedure Date  . Appendectomy 1969  . Cholecystectomy 1989  . Exploratory laparotomy 1969  . Collapse lung 1990    Family History  Problem Relation Age of Onset  . Cancer Mother     lung  . Hypertension Sister   . Multiple sclerosis Brother   . Multiple sclerosis Other   . Alcohol abuse Other   . Arthritis Other   . Diabetes Other   . Kidney disease Other   . Cancer Other     lung,ovarian,skin, uterine  . Stroke Other   . Heart disease Other   . Melanoma Other   . Diabetes Sister   . Rheum arthritis Sister   . Thyroid disease Sister     Allergies    Allergen Reactions  . Penicillins     Current Outpatient Prescriptions on File Prior to Visit  Medication Sig Dispense Refill  . ALPRAZolam (XANAX) 0.25 MG tablet Take 1 tablet (0.25 mg total) by mouth 2 (two) times daily as needed.  60 tablet  4  . hydrocodone-acetaminophen (LORCET PLUS) 7.5-650 MG per tablet Take 1 tablet by mouth every 6 (six) hours as needed.  90 tablet  3  . PARoxetine (PAXIL-CR) 25 MG 24 hr tablet Take 1 tablet (25 mg total) by mouth every morning.  90 tablet  6  . simvastatin (ZOCOR) 40 MG tablet Take 1 tablet (40 mg total) by mouth at bedtime.  90 tablet  6    BP 124/80  Pulse 82  Temp 98.2 F (36.8 C) (Oral)  Resp 18  Ht 5\' 2"  (1.575 m)  Wt 121 lb (54.885 kg)  BMI 22.13 kg/m2  SpO2 98%       Review of Systems  Constitutional: Negative for fever, appetite change, fatigue and unexpected weight change.  HENT: Negative for hearing loss, ear pain, nosebleeds, congestion, sore throat, mouth sores, trouble swallowing, neck stiffness, dental problem, voice change, sinus pressure and tinnitus.   Eyes: Negative for photophobia, pain, redness and  visual disturbance.  Respiratory: Negative for cough, chest tightness and shortness of breath.   Cardiovascular: Negative for chest pain, palpitations and leg swelling.  Gastrointestinal: Negative for nausea, vomiting, abdominal pain, diarrhea, constipation, blood in stool, abdominal distention and rectal pain.  Genitourinary: Negative for dysuria, urgency, frequency, hematuria, flank pain, vaginal bleeding, vaginal discharge, difficulty urinating, genital sores, vaginal pain, menstrual problem and pelvic pain.  Musculoskeletal: Negative for back pain and arthralgias.  Skin: Negative for rash.  Neurological: Negative for dizziness, syncope, speech difficulty, weakness, light-headedness, numbness and headaches.  Hematological: Negative for adenopathy. Does not bruise/bleed easily.  Psychiatric/Behavioral: Negative for  suicidal ideas, behavioral problems, self-injury, dysphoric mood and agitation. The patient is not nervous/anxious.        Objective:   Physical Exam  Constitutional: She is oriented to person, place, and time. She appears well-developed and well-nourished.  HENT:  Head: Normocephalic and atraumatic.  Right Ear: External ear normal.  Left Ear: External ear normal.  Mouth/Throat: Oropharynx is clear and moist.       Edentulous  Eyes: Conjunctivae normal and EOM are normal.  Neck: Normal range of motion. Neck supple. No JVD present. No thyromegaly present.  Cardiovascular: Normal rate, regular rhythm, normal heart sounds and intact distal pulses.   No murmur heard. Pulmonary/Chest: Effort normal and breath sounds normal. She has no wheezes. She has no rales.  Abdominal: Soft. Bowel sounds are normal. She exhibits no distension and no mass. There is no tenderness. There is no rebound and no guarding.  Musculoskeletal: Normal range of motion. She exhibits no edema and no tenderness.  Neurological: She is alert and oriented to person, place, and time. She has normal reflexes. No cranial nerve deficit. She exhibits normal muscle tone. Coordination normal.  Skin: Skin is warm and dry. No rash noted.  Psychiatric: She has a normal mood and affect. Her behavior is normal.          Assessment & Plan:    Preventive health examination Ongoing tobacco use. Patient requests a prescription for Wellbutrin to assist with total smoking cessation Anxiety depression stable we'll refill alprazolam Dyslipidemia. Parameters are well controlled without medication  Still needs a screening colonoscopy and mammogram  calcium and vitamin D supplements encouraged

## 2012-02-09 ENCOUNTER — Encounter: Payer: Self-pay | Admitting: Internal Medicine

## 2012-02-19 ENCOUNTER — Encounter: Payer: 59 | Admitting: Internal Medicine

## 2012-03-25 ENCOUNTER — Encounter: Payer: 59 | Admitting: Internal Medicine

## 2012-04-16 ENCOUNTER — Ambulatory Visit (INDEPENDENT_AMBULATORY_CARE_PROVIDER_SITE_OTHER): Payer: 59 | Admitting: Internal Medicine

## 2012-04-16 ENCOUNTER — Encounter: Payer: Self-pay | Admitting: Internal Medicine

## 2012-04-16 VITALS — BP 130/70 | HR 76 | Temp 97.8°F | Resp 18 | Wt 124.9 lb

## 2012-04-16 DIAGNOSIS — J449 Chronic obstructive pulmonary disease, unspecified: Secondary | ICD-10-CM

## 2012-04-16 DIAGNOSIS — J069 Acute upper respiratory infection, unspecified: Secondary | ICD-10-CM

## 2012-04-16 MED ORDER — HYDROCODONE-HOMATROPINE 5-1.5 MG/5ML PO SYRP: 5.0000 mL | ORAL_SOLUTION | Freq: Four times a day (QID) | ORAL | Status: AC | PRN

## 2012-04-16 MED ORDER — PAROXETINE HCL ER 25 MG PO TB24: 25.0000 mg | ORAL_TABLET | ORAL | Status: DC

## 2012-04-16 MED ORDER — ALPRAZOLAM 0.25 MG PO TABS: 0.2500 mg | ORAL_TABLET | Freq: Two times a day (BID) | ORAL | Status: DC | PRN

## 2012-04-16 MED ORDER — HYDROCODONE-ACETAMINOPHEN 7.5-325 MG PO TABS: 1.0000 | ORAL_TABLET | Freq: Four times a day (QID) | ORAL | Status: DC | PRN

## 2012-04-16 NOTE — Progress Notes (Signed)
Subjective:    Patient ID: Kim [name], female    DOB: April 07, 1956, 57 y.o.   MRN: [id number]  HPI  57 year old patient who has a history of COPD and ongoing low-level tobacco use. She states she has been off tobacco for approximately 4 days female. She's had cough and congestion for about 2 weeks. No fever or other constitutional complaints. She does have a history of arthritis and does require a refill on her analgesics. No new concerns or complaints. No wheezing shortness of breath or chest.  Past Medical History  Diagnosis Date  . C8 RADICULOPATHY 06/05/2009  . COPD 08/31/2007  . DEPRESSION 11/19/2006  . DYSLIPIDEMIA 03/12/2009  . 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 OSTEOARTHRITIS 06/05/2009  . PARESTHESIA 08/24/2007  . TOBACCO ABUSE 01/25/2010    History   Social History  . Marital Status: Married    Spouse Name: N/A    Number of Children: N/A  . Years of Education: N/A   Occupational History  . Not on file.   Social History Main Topics  . Smoking status: Current Every Day Smoker    Types: Cigarettes  . Smokeless tobacco: Never Used  . Alcohol Use: No  . Drug Use: No  . Sexually Active: Not on file   Other Topics Concern  . Not on file   Social History Narrative  . No narrative on file    Past Surgical History  Procedure Date  . Appendectomy 1969  . Cholecystectomy 1989  . Exploratory laparotomy 1969  . Collapse lung 1990    Family History  Problem Relation Age of Onset  . Cancer Mother     lung  . Hypertension Sister   . Multiple sclerosis Brother   . Multiple sclerosis Other   . Alcohol abuse Other   . Arthritis Other   . Diabetes Other   . Kidney disease Other   . Cancer Other     lung,ovarian,skin, uterine  . Stroke Other   . Heart disease Other   . Melanoma Other   . Diabetes Sister   . Rheum arthritis Sister   . Thyroid disease Sister     Allergies  Allergen Reactions  . Penicillins     Current Outpatient Prescriptions on File Prior to Visit   Medication Sig Dispense Refill  . ALPRAZolam (XANAX) 0.25 MG tablet Take 1 tablet (0.25 mg total) by mouth 2 (two) times daily as needed.  60 tablet  4  . buPROPion (WELLBUTRIN XL) 150 MG 24 hr tablet Take 1 tablet (150 mg total) by mouth daily.  60 tablet  2  . PARoxetine (PAXIL-CR) 25 MG 24 hr tablet Take 1 tablet (25 mg total) by mouth every morning.  90 tablet  6    BP 130/70  Pulse 76  Temp 97.8 F (36.6 C) (Oral)  Resp 18  Wt 124 lb 14.4 oz (56.654 kg)  SpO2 95%       Review of Systems  Constitutional: Negative.   HENT: Positive for congestion and rhinorrhea. Negative for hearing loss, sore throat, dental problem, sinus pressure and tinnitus.   Eyes: Negative for pain, discharge and visual disturbance.  Respiratory: Positive for cough. Negative for shortness of breath.   Cardiovascular: Negative for chest pain, palpitations and leg swelling.  Gastrointestinal: Negative for nausea, vomiting, abdominal pain, diarrhea, constipation, blood in stool and abdominal distention.  Genitourinary: Negative for dysuria, urgency, frequency, hematuria, flank pain, vaginal bleeding, vaginal discharge, difficulty urinating, vaginal pain and pelvic pain.  Musculoskeletal: Negative for  joint swelling, arthralgias and gait problem.  Skin: Negative for rash.  Neurological: Negative for dizziness, syncope, speech difficulty, weakness, numbness and headaches.  Hematological: Negative for adenopathy.  Psychiatric/Behavioral: Negative for behavioral problems, dysphoric mood and agitation. The patient is not nervous/anxious.        Objective:   Physical Exam  Constitutional: She is oriented to person, place, and time. She appears well-developed and well-nourished.  HENT:  Head: Normocephalic.  Right Ear: External ear normal.  Left Ear: External ear normal.  Mouth/Throat: Oropharynx is clear and moist.  Eyes: Conjunctivae normal and EOM are normal. Pupils are equal, round, and reactive to  light.  Neck: Normal range of motion. Neck supple. No thyromegaly present.  Cardiovascular: Normal rate, regular rhythm, normal heart sounds and intact distal pulses.   Pulmonary/Chest: Effort normal and breath sounds normal.  Abdominal: Soft. Bowel sounds are normal. She exhibits no mass. There is no tenderness.  Musculoskeletal: Normal range of motion.  Lymphadenopathy:    She has no cervical adenopathy.  Neurological: She is alert and oriented to person, place, and time.  Skin: Skin is warm and dry. No rash noted.  Psychiatric: She has a normal mood and affect. Her behavior is normal.          Assessment & Plan:   Viral URI with cough COPD  We'll treat symptomatically Permanent cessation of smoking encouraged

## 2012-04-16 NOTE — Patient Instructions (Signed)
Take over-the-counter expectorants and cough medications such as  Mucinex DM.  Call if there is no improvement in 5 to 7 days or if he developed worsening cough, fever, or new symptoms, such as shortness of breath or chest pain. 

## 2012-07-12 ENCOUNTER — Encounter: Payer: Self-pay | Admitting: Internal Medicine

## 2012-07-12 ENCOUNTER — Ambulatory Visit (INDEPENDENT_AMBULATORY_CARE_PROVIDER_SITE_OTHER): Payer: 59 | Admitting: Internal Medicine

## 2012-07-12 VITALS — BP 150/80 | HR 76 | Temp 98.1°F | Resp 20 | Wt 125.0 lb

## 2012-07-12 DIAGNOSIS — J449 Chronic obstructive pulmonary disease, unspecified: Secondary | ICD-10-CM

## 2012-07-12 DIAGNOSIS — F17200 Nicotine dependence, unspecified, uncomplicated: Secondary | ICD-10-CM

## 2012-07-12 MED ORDER — DOXYCYCLINE HYCLATE 100 MG PO TABS: 100.0000 mg | ORAL_TABLET | Freq: Two times a day (BID) | ORAL | Status: DC

## 2012-07-12 NOTE — Patient Instructions (Addendum)
Take over-the-counter expectorants and cough medications such as  Mucinex DM.  Call if there is no improvement in 5 to 7 days or if he developed worsening cough, fever, or new symptoms, such as shortness of breath or chest pain.  Take your antibiotic as prescribed until ALL of it is gone, but stop if you develop a rash, swelling, or any side effects of the medication.  Contact our office as soon as possible if  there are side effects of the medication. 

## 2012-07-12 NOTE — Progress Notes (Signed)
Subjective:    Patient ID: Kim [name], female    DOB: July 20, 1955, 57 y.o.   MRN: [id number]  HPI  57 year old patient who presents with a three-week history of increasing cough and congestion. She has a history of prior tobacco use but has been abstinent for the past 19 days. She has been using Mucinex and Alka-Seltzer plus but still has persistent symptoms. Denies any fever or sputum production.  Past Medical History  Diagnosis Date  . C8 RADICULOPATHY 06/05/2009  . COPD 08/31/2007  . DEPRESSION 11/19/2006  . DYSLIPIDEMIA 03/12/2009  . 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 OSTEOARTHRITIS 06/05/2009  . PARESTHESIA 08/24/2007  . TOBACCO ABUSE 01/25/2010    History   Social History  . Marital Status: Married    Spouse Name: N/A    Number of Children: N/A  . Years of Education: N/A   Occupational History  . Not on file.   Social History Main Topics  . Smoking status: Current Every Day Smoker    Types: Cigarettes  . Smokeless tobacco: Never Used  . Alcohol Use: No  . Drug Use: No  . Sexually Active: Not on file   Other Topics Concern  . Not on file   Social History Narrative  . No narrative on file    Past Surgical History  Procedure Laterality Date  . Appendectomy  1969  . Cholecystectomy  1989  . Exploratory laparotomy  1969  . Collapse lung  1990    Family History  Problem Relation Age of Onset  . Cancer Mother     lung  . Hypertension Sister   . Multiple sclerosis Brother   . Multiple sclerosis Other   . Alcohol abuse Other   . Arthritis Other   . Diabetes Other   . Kidney disease Other   . Cancer Other     lung,ovarian,skin, uterine  . Stroke Other   . Heart disease Other   . Melanoma Other   . Diabetes Sister   . Rheum arthritis Sister   . Thyroid disease Sister     Allergies  Allergen Reactions  . Penicillins     Current Outpatient Prescriptions on File Prior to Visit  Medication Sig Dispense Refill  . ALPRAZolam (XANAX) 0.25 MG tablet Take 1  tablet (0.25 mg total) by mouth 2 (two) times daily as needed.  60 tablet  4  . buPROPion (WELLBUTRIN XL) 150 MG 24 hr tablet Take 1 tablet (150 mg total) by mouth daily.  60 tablet  2  . HYDROcodone-acetaminophen (NORCO) 7.5-325 MG per tablet Take 1 tablet by mouth every 6 (six) hours as needed for pain.  60 tablet  1  . PARoxetine (PAXIL-CR) 25 MG 24 hr tablet Take 1 tablet (25 mg total) by mouth every morning.  90 tablet  6   No current facility-administered medications on file prior to visit.    BP 150/80  Pulse 76  Temp(Src) 98.1 F (36.7 C) (Oral)  Resp 20  Wt 125 lb (56.7 kg)  BMI 22.86 kg/m2  SpO2 94%       Review of Systems  Constitutional: Negative.   HENT: Positive for congestion and sinus pressure. Negative for hearing loss, sore throat, rhinorrhea, dental problem and tinnitus.   Eyes: Negative for pain, discharge and visual disturbance.  Respiratory: Positive for cough. Negative for shortness of breath.   Cardiovascular: Negative for chest pain, palpitations and leg swelling.  Gastrointestinal: Negative for nausea, vomiting, abdominal pain, diarrhea, constipation, blood in stool and abdominal  distention.  Genitourinary: Negative for dysuria, urgency, frequency, hematuria, flank pain, vaginal bleeding, vaginal discharge, difficulty urinating, vaginal pain and pelvic pain.  Musculoskeletal: Negative for joint swelling, arthralgias and gait problem.  Skin: Negative for rash.  Neurological: Negative for dizziness, syncope, speech difficulty, weakness, numbness and headaches.  Hematological: Negative for adenopathy.  Psychiatric/Behavioral: Negative for behavioral problems, dysphoric mood and agitation. The patient is not nervous/anxious.        Objective:   Physical Exam  Constitutional: She is oriented to person, place, and time. She appears well-developed and well-nourished.  HENT:  Head: Normocephalic.  Right Ear: External ear normal.  Left Ear: External ear  normal.  Mouth/Throat: Oropharynx is clear and moist.  Eyes: Conjunctivae and EOM are normal. Pupils are equal, round, and reactive to light.  Neck: Normal range of motion. Neck supple. No thyromegaly present.  Cardiovascular: Normal rate, regular rhythm, normal heart sounds and intact distal pulses.   Pulmonary/Chest: Effort normal.  Few scattered rhonchi  Abdominal: Soft. Bowel sounds are normal. She exhibits no mass. There is no tenderness.  Musculoskeletal: Normal range of motion.  Lymphadenopathy:    She has no cervical adenopathy.  Neurological: She is alert and oriented to person, place, and time.  Skin: Skin is warm and dry. No rash noted.  Psychiatric: She has a normal mood and affect. Her behavior is normal.          Assessment & Plan:   Viral URI/bronchitis/exacerbation COPD  Total cessation of smoking encouraged We'll treat with doxycycline Continue expectorants

## 2012-08-11 ENCOUNTER — Ambulatory Visit (INDEPENDENT_AMBULATORY_CARE_PROVIDER_SITE_OTHER): Payer: 59 | Admitting: Internal Medicine

## 2012-08-11 ENCOUNTER — Encounter: Payer: Self-pay | Admitting: Internal Medicine

## 2012-08-11 VITALS — BP 130/70 | HR 65 | Temp 98.4°F | Resp 18 | Wt 124.0 lb

## 2012-08-11 DIAGNOSIS — R319 Hematuria, unspecified: Secondary | ICD-10-CM

## 2012-08-11 DIAGNOSIS — J449 Chronic obstructive pulmonary disease, unspecified: Secondary | ICD-10-CM

## 2012-08-11 DIAGNOSIS — R350 Frequency of micturition: Secondary | ICD-10-CM

## 2012-08-11 DIAGNOSIS — J4489 Other specified chronic obstructive pulmonary disease: Secondary | ICD-10-CM

## 2012-08-11 DIAGNOSIS — R300 Dysuria: Secondary | ICD-10-CM

## 2012-08-11 DIAGNOSIS — F17200 Nicotine dependence, unspecified, uncomplicated: Secondary | ICD-10-CM

## 2012-08-11 LAB — POCT URINALYSIS DIPSTICK
Ketones, UA: NEGATIVE
Nitrite, UA: NEGATIVE
Protein, UA: 100
Urobilinogen, UA: 0.2
pH, UA: 6.5

## 2012-08-11 MED ORDER — ALPRAZOLAM 0.25 MG PO TABS: 0.2500 mg | ORAL_TABLET | Freq: Two times a day (BID) | ORAL | Status: DC | PRN

## 2012-08-11 MED ORDER — CIPROFLOXACIN HCL 500 MG PO TABS: 500.0000 mg | ORAL_TABLET | Freq: Two times a day (BID) | ORAL | Status: DC

## 2012-08-11 MED ORDER — HYDROCODONE-ACETAMINOPHEN 7.5-325 MG PO TABS: 1.0000 | ORAL_TABLET | Freq: Four times a day (QID) | ORAL | Status: DC | PRN

## 2012-08-11 NOTE — Progress Notes (Signed)
  Subjective:    Patient ID: Kim [name], female    DOB: 1955/11/18, 57 y.o.   MRN: [id number]  HPI 57 year old patient who presents with a two-day history of burning dysuria frequency and some hematuria. No fever or flank pain. No abdominal pain.  She has a history mild COPD. She has been abstinent from tobacco products for about 7 weeks   Review of Systems  Constitutional: Negative for fever and chills.  Genitourinary: Positive for dysuria, urgency, frequency and hematuria. Negative for flank pain.       Objective:   Physical Exam  Constitutional: She appears well-developed and well-nourished. No distress.  Abdominal: Soft. Bowel sounds are normal. There is no tenderness.          Assessment & Plan:  Acute UTI. Will treat with Cipro for 3 days History tobacco use. Remains abstinent

## 2012-08-11 NOTE — Patient Instructions (Signed)
Take your antibiotic as prescribed until ALL of it is gone, but stop if you develop a rash, swelling, or any side effects of the medication.  Contact our office as soon as possible if  there are side effects of the medication.  Drink as much fluid as you  can tolerate over the next few days 

## 2012-12-29 ENCOUNTER — Ambulatory Visit (INDEPENDENT_AMBULATORY_CARE_PROVIDER_SITE_OTHER): Payer: 59 | Admitting: Internal Medicine

## 2012-12-29 ENCOUNTER — Encounter: Payer: Self-pay | Admitting: Internal Medicine

## 2012-12-29 ENCOUNTER — Ambulatory Visit (INDEPENDENT_AMBULATORY_CARE_PROVIDER_SITE_OTHER)
Admission: RE | Admit: 2012-12-29 | Discharge: 2012-12-29 | Disposition: A | Discharge status: Home / Self Care | Payer: 59 | Source: Ambulatory Visit | Attending: Internal Medicine | Admitting: Internal Medicine

## 2012-12-29 VITALS — BP 110/70 | HR 90 | Temp 98.5°F | Resp 18 | Wt 116.0 lb

## 2012-12-29 DIAGNOSIS — J449 Chronic obstructive pulmonary disease, unspecified: Secondary | ICD-10-CM

## 2012-12-29 DIAGNOSIS — Z0000 Encounter for general adult medical examination without abnormal findings: Secondary | ICD-10-CM

## 2012-12-29 DIAGNOSIS — R5383 Other fatigue: Secondary | ICD-10-CM

## 2012-12-29 DIAGNOSIS — R5381 Other malaise: Secondary | ICD-10-CM

## 2012-12-29 DIAGNOSIS — R634 Abnormal weight loss: Secondary | ICD-10-CM

## 2012-12-29 DIAGNOSIS — F17200 Nicotine dependence, unspecified, uncomplicated: Secondary | ICD-10-CM

## 2012-12-29 DIAGNOSIS — J4489 Other specified chronic obstructive pulmonary disease: Secondary | ICD-10-CM

## 2012-12-29 DIAGNOSIS — Z23 Encounter for immunization: Secondary | ICD-10-CM

## 2012-12-29 DIAGNOSIS — E785 Hyperlipidemia, unspecified: Secondary | ICD-10-CM

## 2012-12-29 LAB — LIPID PANEL
Cholesterol: 215 mg/dL — ABNORMAL HIGH (ref 0–200)
HDL: 43.8 mg/dL (ref >39.00)
Total CHOL/HDL Ratio: 5
VLDL: 15.8 mg/dL (ref 0.0–40.0)

## 2012-12-29 LAB — COMPREHENSIVE METABOLIC PANEL
ALT: 11 U/L (ref 0–35)
AST: 17 U/L (ref 0–37)
Albumin: 4.5 g/dL (ref 3.5–5.2)
Alkaline Phosphatase: 65 U/L (ref 39–117)
BUN: 13 mg/dL (ref 6–23)
CO2: 29 mEq/L (ref 19–32)
Calcium: 9.9 mg/dL (ref 8.4–10.5)
Chloride: 105 mEq/L (ref 96–112)
Creatinine, Ser: 0.7 mg/dL (ref 0.4–1.2)
GFR: 85.41 mL/min (ref >60.00)
Sodium: 141 mEq/L (ref 135–145)

## 2012-12-29 LAB — CBC WITH DIFFERENTIAL/PLATELET
Basophils Relative: 0.5 % (ref 0.0–3.0)
Eosinophils Absolute: 0.2 10*3/uL (ref 0.0–0.7)
Eosinophils Relative: 1.9 % (ref 0.0–5.0)
HCT: 43.1 % (ref 36.0–46.0)
Hemoglobin: 14.6 g/dL (ref 12.0–15.0)
Lymphocytes Relative: 43.8 % (ref 12.0–46.0)
Lymphs Abs: 3.8 10*3/uL (ref 0.7–4.0)
MCHC: 34 g/dL (ref 30.0–36.0)
MCV: 91.1 fl (ref 78.0–100.0)
Monocytes Absolute: 0.5 10*3/uL (ref 0.1–1.0)
Neutro Abs: 4.1 10*3/uL (ref 1.4–7.7)
RBC: 4.73 Mil/uL (ref 3.87–5.11)
RDW: 14 % (ref 11.5–14.6)

## 2012-12-29 LAB — TSH: TSH: 1.15 u[IU]/mL (ref 0.35–5.50)

## 2012-12-29 MED ORDER — ALPRAZOLAM 0.25 MG PO TABS: 0.2500 mg | ORAL_TABLET | Freq: Two times a day (BID) | ORAL | Status: DC | PRN

## 2012-12-29 MED ORDER — HYDROCODONE-ACETAMINOPHEN 7.5-325 MG PO TABS: 1.0000 | ORAL_TABLET | Freq: Four times a day (QID) | ORAL | Status: DC | PRN

## 2012-12-29 NOTE — Patient Instructions (Signed)
Return in one month for follow-up  

## 2012-12-29 NOTE — Progress Notes (Signed)
Subjective:    Patient ID: Kim [name], female    DOB: October 16, 1955, 57 y.o.   MRN: [id number]  HPI   57 year old patient who is considering for followup. Complains of a several week history of increasing fatigue poor appetite abdominal bloating and a general sense of unwellness. There has been some weight loss since the spring. She discontinued tobacco products in April but earlier this month has resumed the low volume tobacco use. She complains of nausea also.  She did have a screening colonoscopy ordered 2 years ago which was canceled. She had some family stressors recently due to the poor health her 2 daughter and lost but this seems to have resolved. States that she does not feel particularly anxious or depressed.   Wt Readings from Last 3 Encounters:  12/29/12 59 lb (52.617 kg)  08/11/12 59 lb (56.246 kg)  07/12/12 59 lb (56.7 kg)    Past Medical History  Diagnosis Date  . C8 RADICULOPATHY 06/05/2009  . COPD 08/31/2007  . DEPRESSION 11/19/2006  . DYSLIPIDEMIA 03/12/2009  . 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 OSTEOARTHRITIS 06/05/2009  . PARESTHESIA 08/24/2007  . TOBACCO ABUSE 01/25/2010    History   Social History  . Marital Status: Married    Spouse Name: N/A    Number of Children: N/A  . Years of Education: N/A   Occupational History  . Not on file.   Social History Main Topics  . Smoking status: Current Every Day Smoker    Types: Cigarettes  . Smokeless tobacco: Never Used  . Alcohol Use: No  . Drug Use: No  . Sexual Activity: Not on file   Other Topics Concern  . Not on file   Social History Narrative  . No narrative on file    Past Surgical History  Procedure Laterality Date  . Appendectomy  1969  . Cholecystectomy  1989  . Exploratory laparotomy  1969  . Collapse lung  1990    Family History  Problem Relation Age of Onset  . Cancer Mother     lung  . Hypertension Sister   . Multiple sclerosis Brother   . Multiple sclerosis Other   . Alcohol abuse  Other   . Arthritis Other   . Diabetes Other   . Kidney disease Other   . Cancer Other     lung,ovarian,skin, uterine  . Stroke Other   . Heart disease Other   . Melanoma Other   . Diabetes Sister   . Rheum arthritis Sister   . Thyroid disease Sister     Allergies  Allergen Reactions  . Penicillins     Current Outpatient Prescriptions on File Prior to Visit  Medication Sig Dispense Refill  . PARoxetine (PAXIL-CR) 25 MG 24 hr tablet Take 1 tablet (25 mg total) by mouth every morning.  90 tablet  6   No current facility-administered medications on file prior to visit.    BP 110/70  Pulse 90  Temp(Src) 98.5 F (36.9 C) (Oral)  Resp 18  Wt 116 lb (52.617 kg)  BMI 21.21 kg/m2  SpO2 93%     Review of Systems  Constitutional: Positive for activity change, appetite change, fatigue and unexpected weight change.  HENT: Negative for hearing loss, congestion, sore throat, rhinorrhea, dental problem, sinus pressure and tinnitus.   Eyes: Negative for pain, discharge and visual disturbance.  Respiratory: Negative for cough and shortness of breath.   Cardiovascular: Negative for chest pain, palpitations and leg swelling.  Gastrointestinal: Positive for nausea.  Negative for vomiting, abdominal pain, diarrhea, constipation, blood in stool and abdominal distention.  Genitourinary: Negative for dysuria, urgency, frequency, hematuria, flank pain, vaginal bleeding, vaginal discharge, difficulty urinating, vaginal pain and pelvic pain.  Musculoskeletal: Negative for joint swelling, arthralgias and gait problem.  Skin: Negative for rash.  Neurological: Negative for dizziness, syncope, speech difficulty, weakness, numbness and headaches.  Hematological: Negative for adenopathy.  Psychiatric/Behavioral: Negative for behavioral problems, dysphoric mood and agitation. The patient is not nervous/anxious.        Objective:   Physical Exam  Constitutional: She is oriented to person, place,  and time. She appears well-developed and well-nourished.  HENT:  Head: Normocephalic.  Right Ear: External ear normal.  Left Ear: External ear normal.  Mouth/Throat: Oropharynx is clear and moist.  Edentulous  Eyes: Conjunctivae and EOM are normal. Pupils are equal, round, and reactive to light.  Neck: Normal range of motion. Neck supple. No thyromegaly present.  Cardiovascular: Normal rate, regular rhythm, normal heart sounds and intact distal pulses.   Pulmonary/Chest: Effort normal and breath sounds normal.  Abdominal: Soft. Bowel sounds are normal. She exhibits no mass. There is no tenderness.  Musculoskeletal: Normal range of motion.  Lymphadenopathy:    She has no cervical adenopathy.  Neurological: She is alert and oriented to person, place, and time.  Skin: Skin is warm and dry. No rash noted.  No clubbing  Psychiatric: She has a normal mood and affect. Her behavior is normal.          Assessment & Plan:   Fatigue weight loss anorexia abdominal bloating.  We'll set up for a colonoscopy. We'll check screening lab including sed rate as well as a chest x-ray. Will schedule complete exam in 4 weeks. Mammogram encouraged

## 2012-12-30 ENCOUNTER — Telehealth: Payer: Self-pay | Admitting: Internal Medicine

## 2012-12-30 NOTE — Telephone Encounter (Signed)
Pt needs blood work results from yesterday

## 2012-12-30 NOTE — Telephone Encounter (Signed)
See result note. Pt notified

## 2013-01-11 ENCOUNTER — Ambulatory Visit (INDEPENDENT_AMBULATORY_CARE_PROVIDER_SITE_OTHER): Payer: 59 | Admitting: Internal Medicine

## 2013-01-11 ENCOUNTER — Encounter: Payer: Self-pay | Admitting: Internal Medicine

## 2013-01-11 VITALS — BP 108/68 | HR 76 | Temp 98.3°F | Wt 117.0 lb

## 2013-01-11 DIAGNOSIS — F329 Major depressive disorder, single episode, unspecified: Secondary | ICD-10-CM

## 2013-01-11 DIAGNOSIS — F17200 Nicotine dependence, unspecified, uncomplicated: Secondary | ICD-10-CM

## 2013-01-11 DIAGNOSIS — Z0000 Encounter for general adult medical examination without abnormal findings: Secondary | ICD-10-CM

## 2013-01-11 MED ORDER — BUPROPION HCL ER (XL) 300 MG PO TB24: 300.0000 mg | ORAL_TABLET | Freq: Every day | ORAL | Status: DC

## 2013-01-11 MED ORDER — BUPROPION HCL ER (XL) 150 MG PO TB24: 150.0000 mg | ORAL_TABLET | Freq: Every day | ORAL | Status: DC

## 2013-01-11 NOTE — Progress Notes (Signed)
Subjective:    Patient ID: Kim [name], female    DOB: [birth year], 57 y.o.   MRN: [id number]  HPI  57 year old patient who is seen today in follow up. She still feels unwell with weakness poor appetite bloating and just a general sense of unwellness. Denies much in the way of situational stressors. She does have a history of depression. Laboratory screen as well as chest x-ray were normal. The patient is scheduled for a screening colonoscopy  Past Medical History  Diagnosis Date  . C8 RADICULOPATHY 06/05/2009  . COPD 08/31/2007  . DEPRESSION 11/19/2006  . DYSLIPIDEMIA 03/12/2009  . 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 OSTEOARTHRITIS 06/05/2009  . PARESTHESIA 08/24/2007  . TOBACCO ABUSE 01/25/2010    History   Social History  . Marital Status: Married    Spouse Name: N/A    Number of Children: N/A  . Years of Education: N/A   Occupational History  . Not on file.   Social History Main Topics  . Smoking status: Current Every Day Smoker    Types: Cigarettes  . Smokeless tobacco: Never Used  . Alcohol Use: No  . Drug Use: No  . Sexual Activity: Not on file   Other Topics Concern  . Not on file   Social History Narrative  . No narrative on file    Past Surgical History  Procedure Laterality Date  . Appendectomy  1969  . Cholecystectomy  1989  . Exploratory laparotomy  1969  . Collapse lung  1990    Family History  Problem Relation Age of Onset  . Cancer Mother     lung  . Hypertension Sister   . Multiple sclerosis Brother   . Multiple sclerosis Other   . Alcohol abuse Other   . Arthritis Other   . Diabetes Other   . Kidney disease Other   . Cancer Other     lung,ovarian,skin, uterine  . Stroke Other   . Heart disease Other   . Melanoma Other   . Diabetes Sister   . Rheum arthritis Sister   . Thyroid disease Sister     Allergies  Allergen Reactions  . Penicillins     Current Outpatient Prescriptions on File Prior to Visit  Medication Sig Dispense Refill   . ALPRAZolam (XANAX) 0.25 MG tablet Take 1 tablet (0.25 mg total) by mouth 2 (two) times daily as needed.  60 tablet  3  . HYDROcodone-acetaminophen (NORCO) 7.5-325 MG per tablet Take 1 tablet by mouth every 6 (six) hours as needed for pain.  90 tablet  2  . PARoxetine (PAXIL-CR) 25 MG 24 hr tablet Take 1 tablet (25 mg total) by mouth every morning.  90 tablet  6   No current facility-administered medications on file prior to visit.    BP 108/68  Pulse 76  Temp(Src) 98.3 F (36.8 C) (Oral)  Wt 117 lb (53.071 kg)  BMI 21.39 kg/m2  SpO2 94%      Review of Systems  Constitutional: Positive for activity change, appetite change, fatigue and unexpected weight change.  HENT: Negative for hearing loss, congestion, sore throat, rhinorrhea, dental problem, sinus pressure and tinnitus.   Eyes: Negative for pain, discharge and visual disturbance.  Respiratory: Negative for cough and shortness of breath.   Cardiovascular: Negative for chest pain, palpitations and leg swelling.  Gastrointestinal: Negative for nausea, vomiting, abdominal pain, diarrhea, constipation, blood in stool and abdominal distention.  Genitourinary: Negative for dysuria, urgency, frequency, hematuria, flank pain, vaginal bleeding, vaginal discharge,  difficulty urinating, vaginal pain and pelvic pain.  Musculoskeletal: Negative for joint swelling, arthralgias and gait problem.  Skin: Negative for rash.  Neurological: Positive for weakness. Negative for dizziness, syncope, speech difficulty, numbness and headaches.  Hematological: Negative for adenopathy.  Psychiatric/Behavioral: Negative for behavioral problems, dysphoric mood and agitation. The patient is not nervous/anxious.        Objective:   Physical Exam  Constitutional: She is oriented to person, place, and time. She appears well-developed and well-nourished.  HENT:  Head: Normocephalic.  Right Ear: External ear normal.  Left Ear: External ear normal.   Mouth/Throat: Oropharynx is clear and moist.  Eyes: Conjunctivae and EOM are normal. Pupils are equal, round, and reactive to light.  Neck: Normal range of motion. Neck supple. No thyromegaly present.  Cardiovascular: Normal rate, regular rhythm, normal heart sounds and intact distal pulses.   Pulmonary/Chest: Effort normal and breath sounds normal.  Abdominal: Soft. Bowel sounds are normal. She exhibits no mass. There is no tenderness.  Musculoskeletal: Normal range of motion.  Lymphadenopathy:    She has no cervical adenopathy.  Neurological: She is alert and oriented to person, place, and time.  Skin: Skin is warm and dry. No rash noted.  Psychiatric: She has a normal mood and affect. Her behavior is normal.          Assessment & Plan:   Fatigue anorexia. Possible depression although no real stressors. Will augment with Wellbutrin and recheck in 6 weeks schedule colonoscopy  History depression  History tobacco use

## 2013-01-11 NOTE — Patient Instructions (Addendum)
Wellbutrin 150 mg daily for 2 weeks then 300 mg daily    It is important that you exercise regularly, at least 20 minutes 3 to 4 times per week.  If you develop chest pain or shortness of breath seek  medical attention.  Schedule your colonoscopy to help detect colon cancer.

## 2013-01-27 ENCOUNTER — Encounter: Payer: 59 | Admitting: Internal Medicine

## 2013-01-31 ENCOUNTER — Encounter: Payer: 59 | Admitting: Internal Medicine

## 2013-02-05 ENCOUNTER — Other Ambulatory Visit: Payer: Self-pay | Admitting: Internal Medicine

## 2013-02-14 ENCOUNTER — Telehealth: Payer: Self-pay | Admitting: Internal Medicine

## 2013-02-14 MED ORDER — HYDROCODONE-ACETAMINOPHEN 7.5-325 MG PO TABS: 1.0000 | ORAL_TABLET | Freq: Four times a day (QID) | ORAL | Status: DC | PRN

## 2013-02-14 NOTE — Telephone Encounter (Signed)
Pt notified Rx ready for pickup. Rx printed and signed, put at front desk for pickup. 

## 2013-02-14 NOTE — Telephone Encounter (Signed)
Pt needs new rx hydrocodone °

## 2013-03-09 ENCOUNTER — Ambulatory Visit (AMBULATORY_SURGERY_CENTER): Payer: Self-pay | Admitting: *Deleted

## 2013-03-09 VITALS — Ht 63.0 in | Wt 117.8 lb

## 2013-03-09 DIAGNOSIS — Z1211 Encounter for screening for malignant neoplasm of colon: Secondary | ICD-10-CM

## 2013-03-09 MED ORDER — MOVIPREP 100 G PO SOLR: ORAL | Status: DC

## 2013-03-09 NOTE — Progress Notes (Signed)
Patient denies any allergies to eggs or soy. Patient denies any problems with anesthesia.  

## 2013-03-11 ENCOUNTER — Encounter: Payer: Self-pay | Admitting: Internal Medicine

## 2013-03-24 ENCOUNTER — Encounter: Payer: Self-pay | Admitting: Internal Medicine

## 2013-03-24 ENCOUNTER — Ambulatory Visit (AMBULATORY_SURGERY_CENTER): Payer: 59 | Admitting: Internal Medicine

## 2013-03-24 VITALS — BP 119/56 | HR 48 | Temp 96.6°F | Resp 23 | Ht 63.0 in | Wt 117.0 lb

## 2013-03-24 DIAGNOSIS — D126 Benign neoplasm of colon, unspecified: Secondary | ICD-10-CM

## 2013-03-24 DIAGNOSIS — Z1211 Encounter for screening for malignant neoplasm of colon: Secondary | ICD-10-CM

## 2013-03-24 MED ORDER — SODIUM CHLORIDE 0.9 % IV SOLN: 500.0000 mL | INTRAVENOUS | Status: DC

## 2013-03-24 NOTE — Progress Notes (Signed)
No egg or soy allergy. ewm No problems with past sedation. ewm 

## 2013-03-24 NOTE — Op Note (Signed)
 [facility] [address] [place], [address]   COLONOSCOPY PROCEDURE REPORT  PATIENT: Kim [name], Kim [name]  MR#: [id number] BIRTHDATE: 18-Mar-1956 , 57  yrs. old GENDER: Female ENDOSCOPIST: Roxy Cedar, MD REFERRED OZ:HYQMV Amador Cunas, M.D. PROCEDURE DATE:  03/24/2013 PROCEDURE:   Colonoscopy with snare polypectomy x 3 First Screening Colonoscopy - Avg.  risk and is 50 yrs.  old or older Yes.  Prior Negative Screening - Now for repeat screening. N/A  History of Adenoma - Now for follow-up colonoscopy & has been > or = to 3 yrs.  N/A  Polyps Removed Today? Yes. ASA CLASS:   Class II INDICATIONS:average risk screening. MEDICATIONS: MAC sedation, administered by CRNA and propofol (Diprivan) 200mg  IV  DESCRIPTION OF PROCEDURE:   After the risks benefits and alternatives of the procedure were thoroughly explained, informed consent was obtained.  A digital rectal exam revealed no abnormalities of the rectum.   The [id number]  endoscope was introduced through the anus and advanced to the cecum, which was identified by both the appendix and ileocecal valve. No adverse events experienced.   The quality of the prep was excellent, using MoviPrep  The instrument was then slowly withdrawn as the colon was fully examined.   COLON FINDINGS: Three diminutive polyps were found in the transverse colon and sigmoid colon.  A polypectomy was performed with a cold snare.  The resection was complete and the polyp tissue was completely retrieved.   The colon mucosa was otherwise normal. Retroflexed views revealed internal hemorrhoids. The time to cecum=4 minutes 06 seconds.  Withdrawal time=11 minutes 31 seconds. The scope was withdrawn and the procedure completed.  COMPLICATIONS: There were no complications.  ENDOSCOPIC IMPRESSION: 1.   Three diminutive polyps were found in the colon; polypectomy was performed with a cold snare 2.   The colon mucosa was otherwise  normal  RECOMMENDATIONS: 1. Repeat colonoscopy in 5 years if polyp adenomatous; otherwise 10 years   eSigned:  Roxy Cedar, MD 03/24/2013 11:50 AM   cc: Gordy Savers, MD and The Patient

## 2013-03-24 NOTE — Progress Notes (Signed)
(  G8918)Patient did not experience any of the following events: a burn prior to discharge; a fall within the facility; wrong site/side/patient/procedure/implant event; or a hospital transfer or hospital admission upon discharge from the facility. (G8907)Patient did not experience any of the following events: a burn prior to discharge; a fall within the facility; wrong site/side/patient/procedure/implant event; or a hospital transfer or hospital admission upon discharge from the facility. (G8907)Patient did not have preoperative order for IV antibiotic SSI prophylaxis. (G8918)Patient did not have preoperative order for IV antibiotic SSI prophylaxis. [phone])

## 2013-03-24 NOTE — Patient Instructions (Signed)
Discharge instructions given with verbal understanding. Handout on polyps. Resume previous medications. YOU HAD AN ENDOSCOPIC PROCEDURE TODAY AT THE [facility]: Refer to the procedure report that was given to you for any specific questions about what was found during the examination.  If the procedure report does not answer your questions, please call your gastroenterologist to clarify.  If you requested that your care partner not be given the details of your procedure findings, then the procedure report has been included in a sealed envelope for you to review at your convenience later.  YOU SHOULD EXPECT: Some feelings of bloating in the abdomen. Passage of more gas than usual.  Walking can help get rid of the air that was put into your GI tract during the procedure and reduce the bloating. If you had a lower endoscopy (such as a colonoscopy or flexible sigmoidoscopy) you may notice spotting of blood in your stool or on the toilet paper. If you underwent a bowel prep for your procedure, then you may not have a normal bowel movement for a few days.  DIET: Your first meal following the procedure should be a light meal and then it is ok to progress to your normal diet.  A half-sandwich or bowl of soup is an example of a good first meal.  Heavy or fried foods are harder to digest and may make you feel nauseous or bloated.  Likewise meals heavy in dairy and vegetables can cause extra gas to form and this can also increase the bloating.  Drink plenty of fluids but you should avoid alcoholic beverages for 24 hours.  ACTIVITY: Your care partner should take you home directly after the procedure.  You should plan to take it easy, moving slowly for the rest of the day.  You can resume normal activity the day after the procedure however you should NOT DRIVE or use heavy machinery for 24 hours (because of the sedation medicines used during the test).    SYMPTOMS TO REPORT IMMEDIATELY: A  gastroenterologist can be reached at any hour.  During normal business hours, 8:30 AM to 5:00 PM Monday through Friday, call [phone].  After hours and on weekends, please call the GI answering service at [phone] who will take a message and have the physician on call contact you.   Following lower endoscopy (colonoscopy or flexible sigmoidoscopy):  Excessive amounts of blood in the stool  Significant tenderness or worsening of abdominal pains  Swelling of the abdomen that is new, acute  Fever of 100F or higher  FOLLOW UP: If any biopsies were taken you will be contacted by phone or by letter within the next 1-3 weeks.  Call your gastroenterologist if you have not heard about the biopsies in 3 weeks.  Our staff will call the home number listed on your records the next business day following your procedure to check on you and address any questions or concerns that you may have at that time regarding the information given to you following your procedure. This is a courtesy call and so if there is no answer at the home number and we have not heard from you through the emergency physician on call, we will assume that you have returned to your regular daily activities without incident.  SIGNATURES/CONFIDENTIALITY: You and/or your care partner have signed paperwork which will be entered into your electronic medical record.  These signatures attest to the fact that that the information above on your After Visit Summary has been   reviewed and is understood.  Full responsibility of the confidentiality of this discharge information lies with you and/or your care-partner. 

## 2013-03-24 NOTE — Progress Notes (Signed)
Called to room to assist during endoscopic procedure.  Patient ID and intended procedure confirmed with present staff. Received instructions for my participation in the procedure from the performing physician.  

## 2013-03-25 ENCOUNTER — Telehealth: Payer: Self-pay

## 2013-03-25 NOTE — Telephone Encounter (Signed)
  Follow up Call-  Call back number 03/24/2013  Post procedure Call Back phone  # [phone]  Permission to leave phone message Yes     Patient questions:  Do you have a fever, pain , or abdominal swelling? no Pain Score  0 *  Have you tolerated food without any problems? yes  Have you been able to return to your normal activities? yes  Do you have any questions about your discharge instructions: Diet   no Medications  no Follow up visit  no  Do you have questions or concerns about your Care? no  Actions: * If pain score is 4 or above: No action needed, pain <4.

## 2013-03-30 ENCOUNTER — Encounter: Payer: Self-pay | Admitting: Internal Medicine

## 2013-04-18 ENCOUNTER — Telehealth: Payer: Self-pay | Admitting: Internal Medicine

## 2013-04-18 MED ORDER — HYDROCODONE-ACETAMINOPHEN 7.5-325 MG PO TABS: 1.0000 | ORAL_TABLET | Freq: Four times a day (QID) | ORAL | Status: DC | PRN

## 2013-04-18 NOTE — Telephone Encounter (Signed)
Pt notified Rx ready for pickup. Rx printed and signed, put at front desk. 

## 2013-04-18 NOTE — Telephone Encounter (Signed)
Pt requesting refill HYDROcodone-acetaminophen (NORCO) 7.5-325 MG per tablet

## 2013-07-06 ENCOUNTER — Telehealth: Payer: Self-pay | Admitting: Internal Medicine

## 2013-07-06 NOTE — Telephone Encounter (Signed)
Pt needs new rx hydrocodone °

## 2013-07-07 MED ORDER — HYDROCODONE-ACETAMINOPHEN 7.5-325 MG PO TABS: 1.0000 | ORAL_TABLET | Freq: Four times a day (QID) | ORAL | Status: DC | PRN

## 2013-07-07 NOTE — Telephone Encounter (Signed)
Pt notified Rx ready for pickup. Rx printed and signed.  

## 2013-08-30 ENCOUNTER — Telehealth: Payer: Self-pay | Admitting: Internal Medicine

## 2013-08-30 NOTE — Telephone Encounter (Signed)
Pt needs new rx hydrocodone °

## 2013-08-31 MED ORDER — HYDROCODONE-ACETAMINOPHEN 7.5-325 MG PO TABS: 1.0000 | ORAL_TABLET | Freq: Four times a day (QID) | ORAL | Status: DC | PRN

## 2013-08-31 NOTE — Telephone Encounter (Signed)
Left detailed message Rx ready for pickup. Rx printed and signed. 

## 2013-09-05 ENCOUNTER — Other Ambulatory Visit: Payer: Self-pay | Admitting: Internal Medicine

## 2013-09-08 ENCOUNTER — Ambulatory Visit (INDEPENDENT_AMBULATORY_CARE_PROVIDER_SITE_OTHER): Payer: 59 | Admitting: Internal Medicine

## 2013-09-08 ENCOUNTER — Encounter: Payer: Self-pay | Admitting: Internal Medicine

## 2013-09-08 VITALS — BP 130/70 | HR 75 | Temp 97.7°F | Resp 18 | Ht 63.0 in | Wt 114.0 lb

## 2013-09-08 DIAGNOSIS — F17200 Nicotine dependence, unspecified, uncomplicated: Secondary | ICD-10-CM

## 2013-09-08 NOTE — Progress Notes (Signed)
Subjective:    Patient ID: Kim [name], female    DOB: 20-Oct-1955, 58 y.o.   MRN: [id number]  HPI  58 year old patient who has a history of ongoing tobacco use.  She presents today with 2 concerns.  She developed a right thumb paronychia that has been soaking the thumb in warm salt water and yesterday the paronychia opened up and drained.  She now feels much improved. Her other complaint is nocturnal leg cramps.  Past Medical History  Diagnosis Date  . C8 RADICULOPATHY 06/05/2009  . COPD 08/31/2007  . DEPRESSION 11/19/2006  . DYSLIPIDEMIA 03/12/2009  . 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 OSTEOARTHRITIS 06/05/2009  . PARESTHESIA 08/24/2007  . TOBACCO ABUSE 01/25/2010    History   Social History  . Marital Status: Married    Spouse Name: N/A    Number of Children: N/A  . Years of Education: N/A   Occupational History  . Not on file.   Social History Main Topics  . Smoking status: Current Every Day Smoker -- 0.25 packs/day for 30 years    Types: Cigarettes  . Smokeless tobacco: Never Used  . Alcohol Use: No  . Drug Use: No  . Sexual Activity: Not on file   Other Topics Concern  . Not on file   Social History Narrative  . No narrative on file    Past Surgical History  Procedure Laterality Date  . Appendectomy  1969  . Cholecystectomy  1989  . Exploratory laparotomy  1969  . Collapse lung  1990    Family History  Problem Relation Age of Onset  . Cancer Mother     lung  . Hypertension Sister   . Multiple sclerosis Brother   . Multiple sclerosis Other   . Alcohol abuse Other   . Arthritis Other   . Diabetes Other   . Kidney disease Other   . Cancer Other     lung,ovarian,skin, uterine  . Stroke Other   . Heart disease Other   . Melanoma Other   . Diabetes Sister   . Rheum arthritis Sister   . Thyroid disease Sister   . Colon cancer Neg Hx     Allergies  Allergen Reactions  . Penicillins Hives    Current Outpatient Prescriptions on File Prior to Visit    Medication Sig Dispense Refill  . ALPRAZolam (XANAX) 0.25 MG tablet TAKE 1 TABLET BY MOUTH TWICE DAILY AS NEEDED  60 tablet  2  . HYDROcodone-acetaminophen (NORCO) 7.5-325 MG per tablet Take 1 tablet by mouth every 6 (six) hours as needed.  90 tablet  0  . PAXIL CR 25 MG 24 hr tablet take 1 tablet by mouth every morning  90 tablet  3   No current facility-administered medications on file prior to visit.    BP 130/70  Pulse 75  Temp(Src) 97.7 F (36.5 C) (Oral)  Resp 18  Ht 5\' 3"  (1.6 m)  Wt 114 lb (51.71 kg)  BMI 20.20 kg/m2  SpO2 97%       Review of Systems  Constitutional: Negative.   HENT: Negative for congestion, dental problem, hearing loss, rhinorrhea, sinus pressure, sore throat and tinnitus.   Eyes: Negative for pain, discharge and visual disturbance.  Respiratory: Negative for cough and shortness of breath.   Cardiovascular: Negative for chest pain, palpitations and leg swelling.  Gastrointestinal: Negative for nausea, vomiting, abdominal pain, diarrhea, constipation, blood in stool and abdominal distention.  Genitourinary: Negative for dysuria, urgency, frequency, hematuria, flank pain,  vaginal bleeding, vaginal discharge, difficulty urinating, vaginal pain and pelvic pain.  Musculoskeletal: Positive for myalgias. Negative for arthralgias, gait problem and joint swelling.  Skin: Positive for wound. Negative for rash.  Neurological: Negative for dizziness, syncope, speech difficulty, weakness, numbness and headaches.  Hematological: Negative for adenopathy.  Psychiatric/Behavioral: Negative for behavioral problems, dysphoric mood and agitation. The patient is not nervous/anxious.        Objective:   Physical Exam  Constitutional: She appears well-developed and well-nourished. No distress.  Skin: Rash noted.  Resolving paronychia, right thumb          Assessment & Plan:   Resolving paronychia, right thumb.  We'll continue soaks.  We'll call it she  develops any worsening symptoms.  Doubt he'll need antibiotic therapy or I&D Nocturnal leg cramps.  Information dispensed.  She will give a trial of stretching prior to retiring for the night Tobacco abuse.  Total smoking cessation encouraged

## 2013-09-08 NOTE — Patient Instructions (Addendum)
Fingertip Infection When an infection is around the nail, it is called a paronychia. When it appears over the tip of the finger, it is called a felon. These infections are due to minor injuries or cracks in the skin. If they are not treated properly, they can lead to bone infection and permanent damage to the fingernail. Incision and drainage is necessary if a pus pocket (an abscess) has formed. Antibiotics and pain medicine may also be needed. Keep your hand elevated for the next 2-3 days to reduce swelling and pain. If a pack was placed in the abscess, it should be removed in 1-2 days by your caregiver. Soak the finger in warm water for 20 minutes 4 times daily to help promote drainage. Keep the hands as dry as possible. Wear protective gloves with cotton liners. See your caregiver for follow-up care as recommended.  HOME CARE INSTRUCTIONS   Keep wound clean, dry and dressed as suggested by your caregiver.  Soak in warm salt water for fifteen minutes, four times per day for bacterial infections.  Your caregiver will prescribe an antibiotic if a bacterial infection is suspected. Take antibiotics as directed and finish the prescription, even if the problem appears to be improving before the medicine is gone.  Only take over-the-counter or prescription medicines for pain, discomfort, or fever as directed by your caregiver. SEEK IMMEDIATE MEDICAL CARE IF:  There is redness, swelling, or increasing pain in the wound.  Pus or any other unusual drainage is coming from the wound.  An unexplained oral temperature above 102 F (38.9 C) develops.  You notice a foul smell coming from the wound or dressing. MAKE SURE YOU:   Understand these instructions.  Monitor your condition.  Contact your caregiver if you are getting worse or not improving. Document Released: 05/01/2004 Document Revised: 06/16/2011 Document Reviewed: 04/27/2008 [facility] Patient Information 2014 [place], [place]. Leg  Cramps Leg cramps that occur during exercise can be caused by poor circulation or dehydration. However, muscle cramps that occur at rest or during the night are usually not due to any serious medical problem. Heat cramps may cause muscle spasms during hot weather.  CAUSES There is no clear cause for muscle cramps. However, dehydration may be a factor for those who do not drink enough fluids and those who exercise in the heat. Imbalances in the level of sodium, potassium, calcium or magnesium in the muscle tissue may also be a factor. Some medications, such as water pills (diuretics), may cause loss of chemicals that the body needs (like sodium and potassium) and cause muscle cramps. TREATMENT   Make sure your diet has enough fluids and essential minerals for the muscle to work normally.  Avoid strenuous exercise for several days if you have been having frequent leg cramps.  Stretch and massage the cramped muscle for several minutes.  Some medicines may be helpful in some patients with night cramps. Only take over-the-counter or prescription medicines as directed by your caregiver. SEEK IMMEDIATE MEDICAL CARE IF:   Your leg cramps become worse.  Your foot becomes cold, numb, or blue. Document Released: 05/01/2004 Document Revised: 06/16/2011 Document Reviewed: 04/18/2008 [facility] Patient Information 2014 [place].  Smoking tobacco is very bad for your health. You should stop smoking immediately.

## 2013-09-08 NOTE — Progress Notes (Signed)
Pre-visit discussion using our clinic review tool. No additional management support is needed unless otherwise documented below in the visit note.  

## 2013-09-09 ENCOUNTER — Telehealth: Payer: Self-pay | Admitting: Internal Medicine

## 2013-09-09 NOTE — Telephone Encounter (Signed)
Relevant patient education mailed to patient.  

## 2013-11-10 ENCOUNTER — Encounter: Payer: Self-pay | Admitting: Internal Medicine

## 2013-11-10 ENCOUNTER — Ambulatory Visit (INDEPENDENT_AMBULATORY_CARE_PROVIDER_SITE_OTHER): Payer: 59 | Admitting: Internal Medicine

## 2013-11-10 VITALS — BP 112/68 | HR 74 | Temp 98.4°F | Resp 18 | Ht 63.0 in | Wt 108.0 lb

## 2013-11-10 DIAGNOSIS — Z0000 Encounter for general adult medical examination without abnormal findings: Secondary | ICD-10-CM

## 2013-11-10 DIAGNOSIS — F17200 Nicotine dependence, unspecified, uncomplicated: Secondary | ICD-10-CM

## 2013-11-10 DIAGNOSIS — R634 Abnormal weight loss: Secondary | ICD-10-CM | POA: Insufficient documentation

## 2013-11-10 DIAGNOSIS — J449 Chronic obstructive pulmonary disease, unspecified: Secondary | ICD-10-CM

## 2013-11-10 MED ORDER — HYDROCODONE-ACETAMINOPHEN 7.5-325 MG PO TABS: 1.0000 | ORAL_TABLET | Freq: Four times a day (QID) | ORAL | Status: DC | PRN

## 2013-11-10 MED ORDER — ALBUTEROL SULFATE HFA 108 (90 BASE) MCG/ACT IN AERS: 2.0000 | INHALATION_SPRAY | Freq: Four times a day (QID) | RESPIRATORY_TRACT | Status: DC | PRN

## 2013-11-10 MED ORDER — VARENICLINE TARTRATE 1 MG PO TABS: 1.0000 mg | ORAL_TABLET | Freq: Two times a day (BID) | ORAL | Status: DC

## 2013-11-10 MED ORDER — VARENICLINE TARTRATE 0.5 MG X 11 & 1 MG X 42 PO MISC: ORAL | Status: DC

## 2013-11-10 NOTE — Progress Notes (Signed)
Pre visit review using our clinic review tool, if applicable. No additional management support is needed unless otherwise documented below in the visit note. 

## 2013-11-10 NOTE — Progress Notes (Signed)
Subjective:    Patient ID: Kim [name], female    DOB: 01-21-1956, 58 y.o.   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  The past 3-4 months.  She has been sleeping poorly and has had some associated fatigue.  She has had some issues with weight loss in the past.  Weight is down 9 pounds over the past 8 months. She denies any unusual stressors, but does have some chronic anxiety.  She remains on Paxil, but denies any clinical depression. She has resumed tobacco products and has requested another prescription for Chantix. A chest x-ray was obtained in September of last year.  She has requested a low contrast screening chest CT  Past Medical History  Diagnosis Date  . C8 RADICULOPATHY 06/05/2009  . COPD 08/31/2007  . DEPRESSION 11/19/2006  . DYSLIPIDEMIA 03/12/2009  . 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 OSTEOARTHRITIS 06/05/2009  . PARESTHESIA 08/24/2007  . TOBACCO ABUSE 01/25/2010    History   Social History  . Marital Status: Married    Spouse Name: N/A    Number of Children: N/A  . Years of Education: N/A   Occupational History  . Not on file.   Social History Main Topics  . Smoking status: Current Every Day Smoker -- 0.25 packs/day for 30 years    Types: Cigarettes  . Smokeless tobacco: Never Used  . Alcohol Use: No  . Drug Use: No  . Sexual Activity: Not on file   Other Topics Concern  . Not on file   Social History Narrative  . No narrative on file    Past Surgical History  Procedure Laterality Date  . Appendectomy  1969  . Cholecystectomy  1989  . Exploratory laparotomy  1969  . Collapse lung  1990    Family History  Problem Relation Age of Onset  . Cancer Mother     lung  . Hypertension Sister   . Multiple sclerosis Brother   . Multiple sclerosis  Other   . Alcohol abuse Other   . Arthritis Other   . Diabetes Other   . Kidney disease Other   . Cancer Other     lung,ovarian,skin, uterine  . Stroke Other   . Heart disease Other   . Melanoma Other   . Diabetes Sister   . Rheum arthritis Sister   . Thyroid disease Sister   . Colon cancer Neg Hx     Allergies  Allergen Reactions  . Penicillins Hives    Current Outpatient Prescriptions on File Prior to Visit  Medication Sig Dispense Refill  . ALPRAZolam (XANAX) 0.25 MG tablet TAKE 1 TABLET BY MOUTH TWICE DAILY AS NEEDED  60 tablet  2  . PAXIL CR 25 MG 24 hr tablet take 1 tablet by mouth every morning  90 tablet  3   No current facility-administered medications on file prior to visit.    BP 112/68  Pulse 74  Temp(Src) 98.4 F (36.9 C) (Oral)  Resp 18  Ht 5\' 3"  (1.6 m)  Wt 108 lb (48.988 kg)  BMI 19.14 kg/m2  SpO2 96%    Review of Systems  Constitutional: Positive for fatigue and unexpected weight change.  HENT: Negative for congestion, dental problem, hearing loss, rhinorrhea, sinus pressure, sore throat  and tinnitus.   Eyes: Negative for pain, discharge and visual disturbance.  Respiratory: Negative for cough and shortness of breath.   Cardiovascular: Negative for chest pain, palpitations and leg swelling.  Gastrointestinal: Negative for nausea, vomiting, abdominal pain, diarrhea, constipation, blood in stool and abdominal distention.  Genitourinary: Negative for dysuria, urgency, frequency, hematuria, flank pain, vaginal bleeding, vaginal discharge, difficulty urinating, vaginal pain and pelvic pain.  Musculoskeletal: Negative for arthralgias, gait problem and joint swelling.  Skin: Negative for rash.  Neurological: Negative for dizziness, syncope, speech difficulty, weakness, numbness and headaches.  Hematological: Negative for adenopathy.  Psychiatric/Behavioral: Positive for sleep disturbance. Negative for behavioral problems, dysphoric mood and agitation.  The patient is not nervous/anxious.        Objective:   Physical Exam  Constitutional: She is oriented to person, place, and time. She appears well-developed and well-nourished.  HENT:  Head: Normocephalic.  Right Ear: External ear normal.  Left Ear: External ear normal.  Mouth/Throat: Oropharynx is clear and moist.  Eyes: Conjunctivae and EOM are normal. Pupils are equal, round, and reactive to light.  Neck: Normal range of motion. Neck supple. No thyromegaly present.  Cardiovascular: Normal rate, regular rhythm, normal heart sounds and intact distal pulses.   Pulmonary/Chest: Effort normal and breath sounds normal.  Abdominal: Soft. Bowel sounds are normal. She exhibits no mass. There is tenderness.  Mild right mid abdominal discomfort to palpation.  That seems abdominal wall.  No organomegaly  Musculoskeletal: Normal range of motion.  Lymphadenopathy:    She has no cervical adenopathy.  Neurological: She is alert and oriented to person, place, and time.  Skin: Skin is warm and dry. No rash noted.  Psychiatric: She has a normal mood and affect. Her behavior is normal.          Assessment & Plan:   Weight loss.  We'll check thyroid function studies COPD with ongoing tobacco use.  We'll screen with a low contrast chest CT Anxiety disorder  Recheck 4 weeks Total smoking cessation encouraged

## 2013-11-10 NOTE — Patient Instructions (Signed)
Smoking tobacco is very bad for your health. You should stop smoking immediately.  Return in one month for followup

## 2013-11-11 ENCOUNTER — Telehealth: Payer: Self-pay | Admitting: Internal Medicine

## 2013-11-11 LAB — HIV ANTIBODY (ROUTINE TESTING W REFLEX): HIV 1&2 Ab, 4th Generation: NONREACTIVE

## 2013-11-11 LAB — TSH: TSH: 0.18 u[IU]/mL — AB (ref 0.35–4.50)

## 2013-11-11 LAB — SEDIMENTATION RATE: SED RATE: 14 mm/h (ref 0–22)

## 2013-11-11 LAB — T4, FREE: Free T4: 0.88 ng/dL (ref 0.60–1.60)

## 2013-11-11 NOTE — Telephone Encounter (Signed)
Relevant patient education assigned to patient using Emmi. ° °

## 2013-11-17 ENCOUNTER — Telehealth: Payer: Self-pay | Admitting: Internal Medicine

## 2013-11-17 DIAGNOSIS — J449 Chronic obstructive pulmonary disease, unspecified: Secondary | ICD-10-CM

## 2013-11-17 DIAGNOSIS — F17200 Nicotine dependence, unspecified, uncomplicated: Secondary | ICD-10-CM

## 2013-11-17 DIAGNOSIS — R634 Abnormal weight loss: Secondary | ICD-10-CM

## 2013-11-17 NOTE — Telephone Encounter (Signed)
Pt would like to know if her results are back from her labs done on Thursday, also would like to know if her ct scan has been scheduled.

## 2013-11-17 NOTE — Telephone Encounter (Signed)
Spoke to pt told her Dr. Raliegh Ip is out of the office till Monday, and looks like CT was not ordered. Told pt will put order in for CT and someone will contact you and will get back to you on Monday about labs. Pt verbalized understanding. Order for CT put in EPIC.

## 2013-11-21 ENCOUNTER — Telehealth: Payer: Self-pay | Admitting: Internal Medicine

## 2013-11-21 ENCOUNTER — Ambulatory Visit (INDEPENDENT_AMBULATORY_CARE_PROVIDER_SITE_OTHER)
Admission: RE | Admit: 2013-11-21 | Discharge: 2013-11-21 | Disposition: A | Discharge status: Home / Self Care | Payer: 59 | Source: Ambulatory Visit | Attending: Internal Medicine | Admitting: Internal Medicine

## 2013-11-21 DIAGNOSIS — F17200 Nicotine dependence, unspecified, uncomplicated: Secondary | ICD-10-CM

## 2013-11-21 DIAGNOSIS — J449 Chronic obstructive pulmonary disease, unspecified: Secondary | ICD-10-CM

## 2013-11-21 DIAGNOSIS — R634 Abnormal weight loss: Secondary | ICD-10-CM

## 2013-11-21 MED ORDER — IOHEXOL 300 MG/ML  SOLN
80.0000 mL | Freq: Once | INTRAMUSCULAR | Status: AC | PRN
  Administered 2013-11-21: 80 mL via INTRAVENOUS

## 2013-11-21 NOTE — Telephone Encounter (Signed)
Please call/notify patient that lab/test/procedure is normal  Please schedule low contrast screening chest CT

## 2013-11-21 NOTE — Telephone Encounter (Signed)
Spoke to pt told her lab results were normal. Pt verbalized understanding and stated CT scan done today. Told her do not have results yet will get back to you. Pt verbalized understanding.

## 2013-11-21 NOTE — Telephone Encounter (Signed)
Pt calling for lab results from 8/6 please advise.

## 2013-11-21 NOTE — Telephone Encounter (Signed)
Pt calling back due to TSH showed low at 0.18 please advise.

## 2013-11-21 NOTE — Telephone Encounter (Signed)
Pt said she was inform that her lab work was normal but she said My chart say it was not normal. Pt would like a call about this.

## 2013-11-22 ENCOUNTER — Telehealth: Payer: Self-pay | Admitting: Internal Medicine

## 2013-11-22 NOTE — Telephone Encounter (Signed)
Please see message below and advise.

## 2013-11-22 NOTE — Telephone Encounter (Signed)
[employer] denied PA request for Chantix.  Pt's plan requires failure,contraindication or intolerance to one of the following: Habitrol OTC Nicoderm CQ OTC Nicorette gum OTC Nicorette lozenge OTC Nicorette mini-lozenge OTC Thrive gum OTC Or  Thrive lozenge OTC

## 2013-12-02 ENCOUNTER — Encounter: Payer: Self-pay | Admitting: Emergency Medicine

## 2013-12-02 ENCOUNTER — Ambulatory Visit (INDEPENDENT_AMBULATORY_CARE_PROVIDER_SITE_OTHER): Payer: 59 | Admitting: Emergency Medicine

## 2013-12-02 VITALS — BP 102/60 | HR 56 | Temp 97.7°F | Ht 63.0 in | Wt 109.6 lb

## 2013-12-02 DIAGNOSIS — J449 Chronic obstructive pulmonary disease, unspecified: Secondary | ICD-10-CM

## 2013-12-02 DIAGNOSIS — F17200 Nicotine dependence, unspecified, uncomplicated: Secondary | ICD-10-CM

## 2013-12-02 DIAGNOSIS — R911 Solitary pulmonary nodule: Secondary | ICD-10-CM | POA: Insufficient documentation

## 2013-12-02 NOTE — Patient Instructions (Signed)
We will start Spiriva once a day  CONGRATULATIONS on decreasing your smoking!! We need to work hard on stopping completely.  Continue your chantix Try using nicotine patch 7mg  to see if this helps you stop completely.  Use albuterol 2 puffs up to every 4 hours if needed for shortness of breath.  We will consider repeating your pulmonary function testing at some point in the future.  We will repeat your CT scan of the chest in August 2016.  Follow with Dr Lamonte Sakai in 4-6 weeks

## 2013-12-02 NOTE — Progress Notes (Signed)
Subjective:    Patient ID: Kim [name], female    DOB: 06-18-1955, 58 y.o.   MRN: [id number]  HPI 58 yo smoker (65 pk-yrs), hx of COPD formerly seen by Dr Elsworth Soho after a PNA in 2011.  Her spiro in 2011 > FEV1 1.46L (59%), FVC 2.68L (84%), ratio 54%. She has had wt loss 26 lbs over 6 months, increasing dyspnea. She just started chantix and has cut her cig down to 2 per day over the last several days. She has lost the ability to exert. Minimal cough. She does sometimes hear wheeze at night. No other flares except for the one for which she was hospitalized in 2011. She has used albuterol prn > just started   Review of Systems  Constitutional: Positive for unexpected weight change. Negative for fever.  HENT: Negative for congestion, dental problem, ear pain, nosebleeds, postnasal drip, rhinorrhea, sinus pressure, sneezing, sore throat and trouble swallowing.   Eyes: Negative for redness and itching.  Respiratory: Positive for cough and shortness of breath. Negative for chest tightness and wheezing.   Cardiovascular: Negative for palpitations and leg swelling.  Gastrointestinal: Negative for nausea and vomiting.  Genitourinary: Negative for dysuria.  Musculoskeletal: Negative for joint swelling.  Skin: Negative for rash.  Neurological: Negative for headaches.  Hematological: Does not bruise/bleed easily.  Psychiatric/Behavioral: Negative for dysphoric mood. The patient is not nervous/anxious.    Past Medical History  Diagnosis Date  . C8 RADICULOPATHY 06/05/2009  . COPD 08/31/2007  . DEPRESSION 11/19/2006  . DYSLIPIDEMIA 03/12/2009  . 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 OSTEOARTHRITIS 06/05/2009  . PARESTHESIA 08/24/2007  . TOBACCO ABUSE 01/25/2010     Family History  Problem Relation Age of Onset  . Cancer Mother     lung  . Hypertension Sister   . Multiple sclerosis Brother   . Multiple sclerosis Other   . Alcohol abuse Other   . Arthritis Other   . Diabetes Other   . Kidney disease  Other   . Cancer Other     lung,ovarian,skin, uterine  . Stroke Other   . Heart disease Other   . Melanoma Other   . Diabetes Sister   . Rheum arthritis Sister   . Thyroid disease Sister   . Colon cancer Neg Hx      History   Social History  . Marital Status: Married    Spouse Name: N/A    Number of Children: N/A  . Years of Education: N/A   Occupational History  . Not on file.   Social History Main Topics  . Smoking status: Current Every Day Smoker -- 1.50 packs/day for 30 years    Types: Cigarettes  . Smokeless tobacco: Never Used     Comment: pt is using chantix and has had 1/2 cig today  . Alcohol Use: No  . Drug Use: No  . Sexual Activity: Not on file   Other Topics Concern  . Not on file   Social History Narrative  . No narrative on file     Allergies  Allergen Reactions  . Penicillins Hives     Outpatient Prescriptions Prior to Visit  Medication Sig Dispense Refill  . albuterol (PROVENTIL HFA;VENTOLIN HFA) 108 (90 BASE) MCG/ACT inhaler Inhale 2 puffs into the lungs every 6 (six) hours as needed for wheezing or shortness of breath.  1 Inhaler  2  . ALPRAZolam (XANAX) 0.25 MG tablet TAKE 1 TABLET BY MOUTH TWICE DAILY AS NEEDED  60 tablet  2  .  HYDROcodone-acetaminophen (NORCO) 7.5-325 MG per tablet Take 1 tablet by mouth every 6 (six) hours as needed.  90 tablet  0  . PAXIL CR 25 MG 24 hr tablet take 1 tablet by mouth every morning  90 tablet  3  . varenicline (CHANTIX STARTING MONTH PAK) 0.5 MG X 11 & 1 MG X 42 tablet Take one 0.5 mg tablet by mouth once daily for 3 days, then increase to one 0.5 mg tablet twice daily for 4 days, then increase to one 1 mg tablet twice daily.  53 tablet  0  . varenicline (CHANTIX CONTINUING MONTH PAK) 1 MG tablet Take 1 tablet (1 mg total) by mouth 2 (two) times daily.  60 tablet  2   No facility-administered medications prior to visit.         Objective:   Physical Exam Filed Vitals:   12/02/13 1556  BP: 102/60    Pulse: 56  Temp: 97.7 F (36.5 C)  TempSrc: Oral  Height: 5\' 3"  (1.6 m)  Weight: 109 lb 9.6 oz (49.714 kg)  SpO2: 94%   Gen: Pleasant, thin woman, in no distress,  normal affect  ENT: No lesions,  mouth clear,  oropharynx clear, no postnasal drip  Neck: No JVD, no TMG, no carotid bruits  Lungs: No use of accessory muscles, clear on a normal breath, B soft wheeze on a forced expiration.  Cardiovascular: RRR, heart sounds normal, no murmur or gallops, no peripheral edema  Musculoskeletal: No deformities, no cyanosis or clubbing  Neuro: alert, non focal  Skin: Warm, no lesions or rashes  CT chest 11/21/13 --  COMPARISON: Chest x-ray 12/29/2012  FINDINGS:  Mild to moderate centrilobular and paraseptal emphysema. Lungs are  clear. No effusions or nodules.  Heart is normal size. Aorta is normal caliber. Scattered coronary  artery calcifications in the left anterior descending coronary  artery. No mediastinal, hilar, or axillary adenopathy. Chest wall  soft tissues are unremarkable. Imaging into the upper abdomen shows  no acute findings.  IMPRESSION:  Mild to moderate COPD/emphysema.  4 mm left lower lobe pulmonary nodule. Given risk factors for  bronchogenic carcinoma, follow-up chest CT at 1 year is recommended.       Assessment & Plan:  TOBACCO ABUSE Discussed cessation in detail. She is on chantix and has cut way down. Still working on stopping completely.  - consider adding low-dose nicotine patch - gave advice regarding strategies to help quit  COPD - will trial Spiriva respimat - albuterol prn - full PFT in the future - rov 1 to review status  Solitary pulmonary nodule LLL 67mm pulm nodule. Discussed ramifications of this with her, the likelihood of a false positive for malignancy. Our plan will be to repeat her scan in 11/2014. Will scheduled this at her next visit.

## 2013-12-02 NOTE — Assessment & Plan Note (Signed)
LLL 21mm pulm nodule. Discussed ramifications of this with her, the likelihood of a false positive for malignancy. Our plan will be to repeat her scan in 11/2014. Will scheduled this at her next visit.

## 2013-12-02 NOTE — Assessment & Plan Note (Signed)
Discussed cessation in detail. She is on chantix and has cut way down. Still working on stopping completely.  - consider adding low-dose nicotine patch - gave advice regarding strategies to help quit

## 2013-12-02 NOTE — Assessment & Plan Note (Signed)
-   will trial Spiriva respimat - albuterol prn - full PFT in the future - rov 1 to review status

## 2013-12-08 ENCOUNTER — Encounter: Payer: Self-pay | Admitting: Internal Medicine

## 2013-12-08 ENCOUNTER — Ambulatory Visit (INDEPENDENT_AMBULATORY_CARE_PROVIDER_SITE_OTHER): Payer: 59 | Admitting: Internal Medicine

## 2013-12-08 ENCOUNTER — Encounter: Payer: Self-pay | Admitting: *Deleted

## 2013-12-08 VITALS — BP 136/70 | HR 65 | Temp 98.1°F | Resp 18 | Ht 63.0 in | Wt 111.0 lb

## 2013-12-08 DIAGNOSIS — F17200 Nicotine dependence, unspecified, uncomplicated: Secondary | ICD-10-CM

## 2013-12-08 DIAGNOSIS — J449 Chronic obstructive pulmonary disease, unspecified: Secondary | ICD-10-CM

## 2013-12-08 DIAGNOSIS — R911 Solitary pulmonary nodule: Secondary | ICD-10-CM

## 2013-12-08 DIAGNOSIS — R634 Abnormal weight loss: Secondary | ICD-10-CM

## 2013-12-08 MED ORDER — HYDROCODONE-ACETAMINOPHEN 7.5-325 MG PO TABS: 1.0000 | ORAL_TABLET | Freq: Four times a day (QID) | ORAL | Status: DC | PRN

## 2013-12-08 NOTE — Progress Notes (Signed)
Subjective:    Patient ID: Kim [name], female    DOB: Feb 01, 1956, 58 y.o.   MRN: [id number]  HPI  Wt Readings from Last 3 Encounters:  12/08/13 111 lb (50.349 kg)  12/02/13 109 lb 9.6 oz (49.714 kg)  11/10/13 108 lb (48.10 kg)    58 year old patient who is seen today for followup.  She has had a history of recent weight loss, which has stabilized.  She has a history of tobacco use, and presently is on Chantix and tobacco free.  She has rare relapses with an occasional single cigarette. She is having some insomnia and daytime sleepiness.  Her sleep-wake cycle is disrupted and she occasionally takes daytime naps late afternoon She has been seen by pulmonary medicine and now is on Spiriva for COPD.  She has a 4 mm pulmonary nodule and the plan is to repeat low contrast CT in one year.  Past Medical History  Diagnosis Date  . C8 RADICULOPATHY 06/05/2009  . COPD 08/31/2007  . DEPRESSION 11/19/2006  . DYSLIPIDEMIA 03/12/2009  . 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 OSTEOARTHRITIS 06/05/2009  . PARESTHESIA 08/24/2007  . TOBACCO ABUSE 01/25/2010    History   Social History  . Marital Status: Married    Spouse Name: N/A    Number of Children: N/A  . Years of Education: N/A   Occupational History  . Not on file.   Social History Main Topics  . Smoking status: Current Every Day Smoker -- 1.50 packs/day for 30 years    Types: Cigarettes  . Smokeless tobacco: Never Used     Comment: pt is using chantix and has had 1/2 cig today  . Alcohol Use: No  . Drug Use: No  . Sexual Activity: Not on file   Other Topics Concern  . Not on file   Social History Narrative  . No narrative on file    Past Surgical History  Procedure Laterality Date  . Appendectomy  1969  . Cholecystectomy  1989  . Exploratory laparotomy  1969  . Collapse lung  1990    Family History  Problem Relation Age of Onset  . Cancer Mother     lung  . Hypertension Sister   . Multiple sclerosis Brother   .  Multiple sclerosis Other   . Alcohol abuse Other   . Arthritis Other   . Diabetes Other   . Kidney disease Other   . Cancer Other     lung,ovarian,skin, uterine  . Stroke Other   . Heart disease Other   . Melanoma Other   . Diabetes Sister   . Rheum arthritis Sister   . Thyroid disease Sister   . Colon cancer Neg Hx     Allergies  Allergen Reactions  . Penicillins Hives    Current Outpatient Prescriptions on File Prior to Visit  Medication Sig Dispense Refill  . albuterol (PROVENTIL HFA;VENTOLIN HFA) 108 (90 BASE) MCG/ACT inhaler Inhale 2 puffs into the lungs every 6 (six) hours as needed for wheezing or shortness of breath.  1 Inhaler  2  . ALPRAZolam (XANAX) 0.25 MG tablet TAKE 1 TABLET BY MOUTH TWICE DAILY AS NEEDED  60 tablet  2  . PAXIL CR 25 MG 24 hr tablet take 1 tablet by mouth every morning  90 tablet  3  . varenicline (CHANTIX CONTINUING MONTH PAK) 1 MG tablet Take 1 tablet (1 mg total) by mouth 2 (two) times daily.  60 tablet  2   No current facility-administered  medications on file prior to visit.    BP 136/70  Pulse 65  Temp(Src) 98.1 F (36.7 C) (Oral)  Resp 18  Ht 5\' 3"  (1.6 m)  Wt 111 lb (50.349 kg)  BMI 19.67 kg/m2  SpO2 97%     Review of Systems  Constitutional: Negative.   HENT: Negative for congestion, dental problem, hearing loss, rhinorrhea, sinus pressure, sore throat and tinnitus.   Eyes: Negative for pain, discharge and visual disturbance.  Respiratory: Negative for cough and shortness of breath.   Cardiovascular: Negative for chest pain, palpitations and leg swelling.  Gastrointestinal: Negative for nausea, vomiting, abdominal pain, diarrhea, constipation, blood in stool and abdominal distention.  Genitourinary: Negative for dysuria, urgency, frequency, hematuria, flank pain, vaginal bleeding, vaginal discharge, difficulty urinating, vaginal pain and pelvic pain.  Musculoskeletal: Negative for arthralgias, gait problem and joint swelling.    Skin: Negative for rash.  Neurological: Negative for dizziness, syncope, speech difficulty, weakness, numbness and headaches.  Hematological: Negative for adenopathy.  Psychiatric/Behavioral: Positive for sleep disturbance. Negative for behavioral problems, dysphoric mood and agitation. The patient is nervous/anxious.        Objective:   Physical Exam  Constitutional: She appears well-developed and well-nourished. No distress.  Pulmonary/Chest: Effort normal and breath sounds normal. No respiratory distress. She has no wheezes. She has no rales.  O2 saturation 97          Assessment & Plan:  COPD.  Stable on bronchodilators.  We'll continue when necessary rescue albuterol Tobacco abuse.  Continue efforts at permanent smoking cessation 4 mm pulmonary nodule.  Followup chest CT with 12 months Weight loss.  Appears to have stabilized  Pulmonary followup in October as scheduled Recheck here 6 months or as needed

## 2013-12-08 NOTE — Patient Instructions (Signed)
Please call if there is any further weight loss  Pulmonary followup as scheduled  Recheck 6 months

## 2014-01-06 ENCOUNTER — Ambulatory Visit: Payer: 59 | Admitting: Emergency Medicine

## 2014-01-09 ENCOUNTER — Encounter: Payer: Self-pay | Admitting: Emergency Medicine

## 2014-02-24 ENCOUNTER — Other Ambulatory Visit: Payer: Self-pay | Admitting: Internal Medicine

## 2014-03-06 ENCOUNTER — Encounter: Payer: Self-pay | Admitting: Internal Medicine

## 2014-03-06 ENCOUNTER — Ambulatory Visit (INDEPENDENT_AMBULATORY_CARE_PROVIDER_SITE_OTHER): Payer: 59 | Admitting: Internal Medicine

## 2014-03-06 VITALS — BP 150/74 | HR 62 | Temp 97.5°F | Resp 20 | Ht 63.0 in | Wt 121.0 lb

## 2014-03-06 DIAGNOSIS — J441 Chronic obstructive pulmonary disease with (acute) exacerbation: Secondary | ICD-10-CM

## 2014-03-06 MED ORDER — HYDROCODONE-ACETAMINOPHEN 7.5-325 MG PO TABS: 1.0000 | ORAL_TABLET | Freq: Four times a day (QID) | ORAL | Status: DC | PRN

## 2014-03-06 MED ORDER — PREDNISONE 10 MG PO TABS: 10.0000 mg | ORAL_TABLET | Freq: Two times a day (BID) | ORAL | Status: DC

## 2014-03-06 NOTE — Progress Notes (Signed)
Subjective:    Patient ID: Kim [name], female    DOB: 07-27-1955, 58 y.o.   MRN: [id number]  HPI 58 year old patient who has a history of COPD.  She now has been off tobacco products for about 2 and a half months.  She presents with a five-day history of increasing chest congestion and cough.  She has not been using albuterol.  No fever. She has a history of mild dyslipidemia and osteoarthritis.  Past Medical History  Diagnosis Date  . C8 RADICULOPATHY 06/05/2009  . COPD 08/31/2007  . DEPRESSION 11/19/2006  . DYSLIPIDEMIA 03/12/2009  . 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 OSTEOARTHRITIS 06/05/2009  . PARESTHESIA 08/24/2007  . TOBACCO ABUSE 01/25/2010    History   Social History  . Marital Status: Married    Spouse Name: N/A    Number of Children: N/A  . Years of Education: N/A   Occupational History  . Not on file.   Social History Main Topics  . Smoking status: Current Every Day Smoker -- 1.50 packs/day for 30 years    Types: Cigarettes  . Smokeless tobacco: Never Used     Comment: pt is using chantix and has had 1/2 cig today  . Alcohol Use: No  . Drug Use: No  . Sexual Activity: Not on file   Other Topics Concern  . Not on file   Social History Narrative    Past Surgical History  Procedure Laterality Date  . Appendectomy  1969  . Cholecystectomy  1989  . Exploratory laparotomy  1969  . Collapse lung  1990    Family History  Problem Relation Age of Onset  . Cancer Mother     lung  . Hypertension Sister   . Multiple sclerosis Brother   . Multiple sclerosis Other   . Alcohol abuse Other   . Arthritis Other   . Diabetes Other   . Kidney disease Other   . Cancer Other     lung,ovarian,skin, uterine  . Stroke Other   . Heart disease Other   . Melanoma Other   . Diabetes Sister   . Rheum arthritis Sister   . Thyroid disease Sister   . Colon cancer Neg Hx     Allergies  Allergen Reactions  . Penicillins Hives    Current Outpatient Prescriptions on  File Prior to Visit  Medication Sig Dispense Refill  . albuterol (PROVENTIL HFA;VENTOLIN HFA) 108 (90 BASE) MCG/ACT inhaler Inhale 2 puffs into the lungs every 6 (six) hours as needed for wheezing or shortness of breath. 1 Inhaler 2  . ALPRAZolam (XANAX) 0.25 MG tablet TAKE 1 TABLET BY MOUTH TWICE DAILY AS NEEDED 60 tablet 2  . HYDROcodone-acetaminophen (NORCO) 7.5-325 MG per tablet Take 1 tablet by mouth every 6 (six) hours as needed. 90 tablet 0  . PAXIL CR 25 MG 24 hr tablet take 1 tablet by mouth every morning 90 tablet 1   No current facility-administered medications on file prior to visit.    BP 150/74 mmHg  Pulse 62  Temp(Src) 97.5 F (36.4 C) (Oral)  Resp 20  Ht 5\' 3"  (1.6 m)  Wt 121 lb (54.885 kg)  BMI 21.44 kg/m2  SpO2 96%      Review of Systems  Constitutional: Positive for activity change, appetite change and fatigue.  HENT: Negative for congestion, dental problem, hearing loss, rhinorrhea, sinus pressure, sore throat and tinnitus.   Eyes: Negative for pain, discharge and visual disturbance.  Respiratory: Positive for cough and shortness  of breath.   Cardiovascular: Negative for chest pain, palpitations and leg swelling.  Gastrointestinal: Negative for nausea, vomiting, abdominal pain, diarrhea, constipation, blood in stool and abdominal distention.  Genitourinary: Negative for dysuria, urgency, frequency, hematuria, flank pain, vaginal bleeding, vaginal discharge, difficulty urinating, vaginal pain and pelvic pain.  Musculoskeletal: Negative for joint swelling, arthralgias and gait problem.  Skin: Negative for rash.  Neurological: Negative for dizziness, syncope, speech difficulty, weakness, numbness and headaches.  Hematological: Negative for adenopathy.  Psychiatric/Behavioral: Negative for behavioral problems, dysphoric mood and agitation. The patient is not nervous/anxious.        Objective:   Physical Exam  Constitutional: She is oriented to person, place,  and time. She appears well-developed and well-nourished.  Frequent paroxysms of coughing Afebrile O2 saturation 96 Pulse 62  HENT:  Head: Normocephalic.  Right Ear: External ear normal.  Left Ear: External ear normal.  Mouth/Throat: Oropharynx is clear and moist.  Eyes: Conjunctivae and EOM are normal. Pupils are equal, round, and reactive to light.  Neck: Normal range of motion. Neck supple. No thyromegaly present.  Cardiovascular: Normal rate, regular rhythm, normal heart sounds and intact distal pulses.   Pulmonary/Chest: Effort normal and breath sounds normal. No respiratory distress. She has no wheezes. She has no rales.  Abdominal: Soft. Bowel sounds are normal. She exhibits no mass. There is no tenderness.  Musculoskeletal: Normal range of motion.  Lymphadenopathy:    She has no cervical adenopathy.  Neurological: She is alert and oriented to person, place, and time.  Skin: Skin is warm and dry. No rash noted.  Psychiatric: She has a normal mood and affect. Her behavior is normal.          Assessment & Plan:   COPD exacerbation.  Will continue albuterol use when necessary;  we'll treat with short-term prednisone.  Helpful maintain nonsmoking status Dyslipidemia

## 2014-03-06 NOTE — Progress Notes (Signed)
Pre visit review using our clinic review tool, if applicable. No additional management support is needed unless otherwise documented below in the visit note. 

## 2014-03-06 NOTE — Patient Instructions (Signed)
Take over-the-counter expectorants and cough medications such as  Mucinex DM.  Call if there is no improvement in 5 to 7 days or if  you develop worsening cough, fever, or new symptoms, such as shortness of breath or chest pain. 

## 2014-03-28 ENCOUNTER — Ambulatory Visit: Payer: 59

## 2014-04-05 ENCOUNTER — Other Ambulatory Visit: Payer: Self-pay | Admitting: Internal Medicine

## 2014-04-05 ENCOUNTER — Encounter: Payer: Self-pay | Admitting: Internal Medicine

## 2014-04-05 ENCOUNTER — Ambulatory Visit (INDEPENDENT_AMBULATORY_CARE_PROVIDER_SITE_OTHER): Payer: 59 | Admitting: Internal Medicine

## 2014-04-05 VITALS — BP 116/64 | HR 88 | Ht 63.0 in | Wt 123.0 lb

## 2014-04-05 DIAGNOSIS — J441 Chronic obstructive pulmonary disease with (acute) exacerbation: Secondary | ICD-10-CM

## 2014-04-05 MED ORDER — BENZONATATE 100 MG PO CAPS: 100.0000 mg | ORAL_CAPSULE | Freq: Three times a day (TID) | ORAL | Status: DC | PRN

## 2014-04-05 NOTE — Patient Instructions (Signed)
Work on inhaler technique:  relax and gently blow all the way out then take a nice smooth deep breath back in, triggering the inhaler at same time you start breathing in.  Hold for up to 5 seconds if you can.  Rinse and gargle with water when done  Stop theophylline for now   Only use your albuterol (ventolin) as a rescue medication to be used if you can't catch your breath by resting or doing a relaxed purse lip breathing pattern.  - The less you use it, the better it will work when you need it. - Ok to use up to 2 puffs  every 4 hours if you must but call for immediate appointment if use goes up over your usual need - Don't leave home without it !!  (think of it like the spare tire for your car)   See Tammy NP w/in 2 weeks with all your medications and your pill boxes, even over the counter meds, separated in two separate bags, the ones you take no matter what vs the ones you stop once you feel better and take only as needed when you feel you need them.

## 2014-04-05 NOTE — Progress Notes (Signed)
Subjective:    Patient ID: Kim [name], female    DOB: [birth year], 58 y.o.   MRN: [id number]   Brief patient profile:  58 yo quit smoking 12/2013  COPD formerly seen by Dr Elsworth Soho after a PNA in 2011.  Her spiro in 2011 > FEV1 1.46L (59%), FVC 2.68L (84%), ratio 54%. She has had wt loss 26 lbs over 6 months, increasing dyspnea. She just started chantix and has cut her cig down to 2 per day over the last several days. She has lost the ability to exert. Minimal cough. She does sometimes hear wheeze at night. No other flares except for the one for which she was hospitalized in 2011. She has used albuterol prn > just started rec by Dr Lamonte Sakai  12/02/13 COPD - will trial Spiriva respimat - albuterol prn - full PFT in the future - rov 1 to review status  Solitary pulmonary nodule LLL 68mm pulm nodule. Discussed ramifications of this with her, the likelihood of a false positive for malignancy. Our plan will be to repeat her scan in 11/2014. Will scheduled this at her next visit.       04/05/2014 post hosp f/uov/Wert re: aecopd  Chief Complaint  Patient presents with  . HFU    Pt states admitted to [facility] in [facility] on 03/24/14 with COPD exacerbation with was discharged on 03/31/14. She states that her breathing is better, almost back to her normal baseline. She has prod cough with light light yellow sputum- getting better taking mucinex.    baseline = spiriva daily , rare daily and quit smoking 12/2013 and Not limited by breathing from desired activities  Then thx day flu like illness with persistent congestion neve really cleared then in hosp xmas day  in [facility] / ICU > better but now on multiple meds and pred taper and just completed levaquin but still same am cough/congestion, not quite back to baseline doe x more than 100 yards flat and mod pace   Now on spiriva/ theoph/ steroids/ symbicort and vent qid, not prn   No obvious day to day or daytime variabilty or assoc purulent sputum or  cp or chest tightness, subjective wheeze overt sinus or hb symptoms. No unusual exp hx or h/o childhood pna/ asthma or knowledge of premature birth.  Sleeping ok without nocturnal  exac  of respiratory  c/o's or need for noct saba. Also denies any obvious fluctuation of symptoms with weather or environmental changes or other aggravating or alleviating factors except as outlined above   Current Medications, Allergies, Complete Past Medical History, Past Surgical History, Family History, and Social History were reviewed in [employer] record.  ROS  The following are not active complaints unless bolded sore throat, dysphagia, dental problems, itching, sneezing,  nasal congestion or excess/ purulent secretions, ear ache,   fever, chills, sweats, unintended wt loss, pleuritic or exertional cp, hemoptysis,  orthopnea pnd or leg swelling, presyncope, palpitations, heartburn, abdominal pain, anorexia, nausea, vomiting, diarrhea  or change in bowel or urinary habits, change in stools or urine, dysuria,hematuria,  rash, arthralgias, visual complaints, headache, numbness weakness or ataxia or problems with walking or coordination,  change in mood/affect or memory.            Objective:   Physical Exam   Thin amb wf nad congested/ rattling cough on FVC   Wt Readings from Last 3 Encounters:  04/05/14 123 lb (55.792 kg)  03/06/14 121 lb (54.885 kg)  12/08/13 111 lb (50.349 kg)    Vital signs reviewed  HEENT mild turbinate edema.  Oropharynx no thrush or excess pnd or cobblestoning.  No JVD or cervical adenopathy. Mild accessory muscle hypertrophy. Trachea midline, nl thryroid. Chest was hyperinflated by percussion with diminished breath sounds and moderate increased exp time without wheeze. Hoover sign positive at mid inspiration. Regular rate and rhythm without murmur gallop or rub or increase P2 or edema.  Abd: no hsm, nl excursion. Ext warm without cyanosis or clubbing.            CT chest 11/21/13 --  COMPARISON: Chest x-ray 12/29/2012  FINDINGS:  Mild to moderate centrilobular and paraseptal emphysema. Lungs are  clear. No effusions or nodules.  Heart is normal size. Aorta is normal caliber. Scattered coronary  artery calcifications in the left anterior descending coronary  artery. No mediastinal, hilar, or axillary adenopathy. Chest wall  soft tissues are unremarkable. Imaging into the upper abdomen shows  no acute findings.  IMPRESSION:  Mild to moderate COPD/emphysema.  4 mm left lower lobe pulmonary nodule. Given risk factors for  bronchogenic carcinoma, follow-up chest CT at 1 year is recommended.       Assessment & Plan:

## 2014-04-05 NOTE — Assessment & Plan Note (Signed)
DDX of  difficult airways management all start with A and  include Adherence, Ace Inhibitors, Acid Reflux, Active Sinus Disease, Alpha 1 Antitripsin deficiency, Anxiety masquerading as Airways dz,  ABPA,  allergy(esp in young), Aspiration (esp in elderly), Adverse effects of DPI,  Active smokers, plus two Bs  = Bronchiectasis and Beta blocker use..and one C= CHF  Adherence is always the initial "prime suspect" and is a multilayered concern that requires a "trust but verify" approach in every patient - starting with knowing how to use medications, especially inhalers, correctly, keeping up with refills and understanding the fundamental difference between maintenance and prns vs those medications only taken for a very short course and then stopped and not refilled.  - The proper method of use, as well as anticipated side effects, of a metered-dose inhaler are discussed and demonstrated to the patient. Improved effectiveness after extensive coaching during this visit to a level of approximately  90% so continue symb/ spiriva but change vent to q 4 h prn  ? Acid (or non-acid) GERD > always difficult to exclude as up to 75% of pts in some series report no assoc GI/ Heartburn symptoms>  D/c theoph, low threshold to rx ppi ac am and pepcid hs  ? Active smoking > denies since 12/2013   ? Allergy > doubt, ok to taper off  Main thing she needs is better organization of meds and understanding how to use prns     Each maintenance medication was reviewed in detail including most importantly the difference between maintenance and as needed and under what circumstances the prns are to be used.  Please see instructions for details which were reviewed in writing and the patient given a copy.

## 2014-04-19 ENCOUNTER — Encounter: Payer: Self-pay | Admitting: Adult Health

## 2014-04-19 ENCOUNTER — Ambulatory Visit (INDEPENDENT_AMBULATORY_CARE_PROVIDER_SITE_OTHER): Payer: 59 | Admitting: Adult Health

## 2014-04-19 VITALS — BP 122/72 | HR 87 | Temp 97.8°F | Ht 63.0 in | Wt 123.0 lb

## 2014-04-19 DIAGNOSIS — J441 Chronic obstructive pulmonary disease with (acute) exacerbation: Secondary | ICD-10-CM

## 2014-04-19 DIAGNOSIS — R911 Solitary pulmonary nodule: Secondary | ICD-10-CM

## 2014-04-19 NOTE — Assessment & Plan Note (Signed)
Recent COPD exacerbation, now improving No decompensation off of theophylline Patient's medications were reviewed today and patient education was given. Computerized medication calendar was adjusted/completed  No ambulatory desaturations on room air in the office  Plan  Continue on current regimen  Follow up Dr. Lamonte Sakai  In 2 months with PFT

## 2014-04-19 NOTE — Progress Notes (Signed)
Subjective:    Patient ID: Kim [name], female    DOB: 08-Oct-1955, 59 y.o.   MRN: [id number]   Brief patient profile:  59 yo quit smoking 12/2013  COPD formerly seen by Dr Elsworth Soho after a PNA in 2011.  Her spiro in 2011 > FEV1 1.46L (59%), FVC 2.68L (84%), ratio 54%. She has had wt loss 26 lbs over 6 months, increasing dyspnea. She just started chantix and has cut her cig down to 2 per day over the last several days. She has lost the ability to exert. Minimal cough. She does sometimes hear wheeze at night. No other flares except for the one for which she was hospitalized in 2011. She has used albuterol prn > just started rec by Dr Lamonte Sakai  12/02/13 COPD - will trial Spiriva respimat - albuterol prn - full PFT in the future - rov 1 to review status  Solitary pulmonary nodule LLL 10mm pulm nodule. Discussed ramifications of this with her, the likelihood of a false positive for malignancy. Our plan will be to repeat her scan in 11/2014. Will scheduled this at her next visit.       04/05/2014 post hosp f/uov/Wert re: aecopd  Chief Complaint  Patient presents with  . HFU    Pt states admitted to [facility] in [facility] on 03/24/14 with COPD exacerbation with was discharged on 03/31/14. She states that her breathing is better, almost back to her normal baseline. She has prod cough with light light yellow sputum- getting better taking mucinex.    baseline = spiriva daily , rare daily and quit smoking 12/2013 and Not limited by breathing from desired activities  Then thx day flu like illness with persistent congestion neve really cleared then in hosp xmas day  in [facility] / ICU > better but now on multiple meds and pred taper and just completed levaquin but still same am cough/congestion, not quite back to baseline doe x more than 100 yards flat and mod pace   Now on spiriva/ theoph/ steroids/ symbicort and vent qid, not prn  >>Theophylline d/c    04/19/2014 Follow up COPD and Med review  Patient  returns for a two-week follow-up for COPD Patient had a recent hospitalization in [place] for COPD exacerbation. She was treated with steroids and does feel that she is starting to improve. Seen 2 weeks ago in the office and theophylline was stopped. She feels that her breathing has improved about 60% She denies any hemoptysis, chest pain, orthopnea, PND or leg swelling We reviewed all her medications and organized them into a medication calendar with patient education It appears that she is taking her medications correctly No ambulatory desaturations on room air in the office.  Current Medications, Allergies, Complete Past Medical History, Past Surgical History, Family History, and Social History were reviewed in [employer] record.  ROS  The following are not active complaints unless bolded sore throat, dysphagia, dental problems, itching, sneezing,  nasal congestion or excess/ purulent secretions, ear ache,   fever, chills, sweats, unintended wt loss, pleuritic or exertional cp, hemoptysis,  orthopnea pnd or leg swelling, presyncope, palpitations, heartburn, abdominal pain, anorexia, nausea, vomiting, diarrhea  or change in bowel or urinary habits, change in stools or urine, dysuria,hematuria,  rash, arthralgias, visual complaints, headache, numbness weakness or ataxia or problems with walking or coordination,  change in mood/affect or memory.            Objective:   Physical Exam  Thin amb wf   Vital signs reviewed  HEENT mild turbinate edema.  Oropharynx no thrush or excess pnd or cobblestoning.  No JVD or cervical adenopathy. Mild accessory muscle hypertrophy. Trachea midline, nl thryroid. Chest was hyperinflated by percussion with diminished breath sounds and moderate increased exp time without wheeze. Hoover sign positive at mid inspiration. Regular rate and rhythm without murmur gallop or rub or increase P2 or edema.  Abd: no hsm, nl excursion. Ext  warm without cyanosis or clubbing.           CT chest 11/21/13 --  COMPARISON: Chest x-ray 12/29/2012  FINDINGS:  Mild to moderate centrilobular and paraseptal emphysema. Lungs are  clear. No effusions or nodules.  Heart is normal size. Aorta is normal caliber. Scattered coronary  artery calcifications in the left anterior descending coronary  artery. No mediastinal, hilar, or axillary adenopathy. Chest wall  soft tissues are unremarkable. Imaging into the upper abdomen shows  no acute findings.  IMPRESSION:  Mild to moderate COPD/emphysema.  4 mm left lower lobe pulmonary nodule. Given risk factors for  bronchogenic carcinoma, follow-up chest CT at 1 year is recommended.       Assessment & Plan:

## 2014-04-19 NOTE — Assessment & Plan Note (Signed)
Plan for follow-up CT in August 2016

## 2014-04-19 NOTE — Patient Instructions (Signed)
Continue on current regimen  Follow up Dr. Lamonte Sakai  In 2 months with PFT

## 2014-04-24 ENCOUNTER — Other Ambulatory Visit: Payer: Self-pay | Admitting: Internal Medicine

## 2014-04-24 ENCOUNTER — Encounter: Payer: Self-pay | Admitting: Internal Medicine

## 2014-04-24 ENCOUNTER — Ambulatory Visit (INDEPENDENT_AMBULATORY_CARE_PROVIDER_SITE_OTHER): Payer: 59 | Admitting: Internal Medicine

## 2014-04-24 VITALS — BP 130/70 | HR 80 | Temp 98.0°F | Resp 18 | Ht 63.0 in | Wt 127.0 lb

## 2014-04-24 DIAGNOSIS — R911 Solitary pulmonary nodule: Secondary | ICD-10-CM

## 2014-04-24 DIAGNOSIS — J441 Chronic obstructive pulmonary disease with (acute) exacerbation: Secondary | ICD-10-CM

## 2014-04-24 DIAGNOSIS — M159 Polyosteoarthritis, unspecified: Secondary | ICD-10-CM

## 2014-04-24 DIAGNOSIS — M150 Primary generalized (osteo)arthritis: Secondary | ICD-10-CM

## 2014-04-24 MED ORDER — HYDROCODONE-ACETAMINOPHEN 7.5-325 MG PO TABS: 1.0000 | ORAL_TABLET | Freq: Four times a day (QID) | ORAL | Status: DC | PRN

## 2014-04-24 MED ORDER — NYSTATIN 100000 UNIT/ML MT SUSP: 5.0000 mL | Freq: Four times a day (QID) | OROMUCOSAL | Status: DC

## 2014-04-24 MED ORDER — ALPRAZOLAM 0.5 MG PO TABS: ORAL_TABLET | ORAL | Status: DC

## 2014-04-24 NOTE — Progress Notes (Signed)
Pre visit review using our clinic review tool, if applicable. No additional management support is needed unless otherwise documented below in the visit note. 

## 2014-04-24 NOTE — Progress Notes (Signed)
Subjective:    Patient ID: Kim [name], female    DOB: 02-09-1956, 59 y.o.   MRN: [id number]  HPI  59 year old patient who is seen following a hospital admission in [place], [place] last month.  She was seen and followed by pulmonary medicine on December 30.  She continues to do well and remains abstinent from tobacco.  She is on maintenance bronchodilators and has not used any rescue albuterol.  In general doing quite well Exacerbation of COPD was complicated by elevated troponins and the patient had a heart catheterization.  This apparently showed normal coronary arteries The patient is scheduled for pulmonary follow-up and PFTs in 2 months  Past Medical History  Diagnosis Date  . C8 RADICULOPATHY 06/05/2009  . COPD 08/31/2007  . DEPRESSION 11/19/2006  . DYSLIPIDEMIA 03/12/2009  . 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 OSTEOARTHRITIS 06/05/2009  . PARESTHESIA 08/24/2007  . TOBACCO ABUSE 01/25/2010    History   Social History  . Marital Status: Married    Spouse Name: N/A    Number of Children: N/A  . Years of Education: N/A   Occupational History  . Not on file.   Social History Main Topics  . Smoking status: Former Smoker -- 1.50 packs/day for 30 years    Types: Cigarettes    Quit date: 12/21/2013  . Smokeless tobacco: Never Used  . Alcohol Use: No  . Drug Use: No  . Sexual Activity: Not on file   Other Topics Concern  . Not on file   Social History Narrative    Past Surgical History  Procedure Laterality Date  . Appendectomy  1969  . Cholecystectomy  1989  . Exploratory laparotomy  1969  . Collapse lung  1990    Family History  Problem Relation Age of Onset  . Cancer Mother     lung  . Hypertension Sister   . Multiple sclerosis Brother   . Multiple sclerosis Other   . Alcohol abuse Other   . Arthritis Other   . Diabetes Other   . Kidney disease Other   . Cancer Other     lung,ovarian,skin, uterine  . Stroke Other   . Heart disease Other   .  Melanoma Other   . Diabetes Sister   . Rheum arthritis Sister   . Thyroid disease Sister   . Colon cancer Neg Hx     Allergies  Allergen Reactions  . Penicillins Hives    Current Outpatient Prescriptions on File Prior to Visit  Medication Sig Dispense Refill  . albuterol (PROVENTIL HFA;VENTOLIN HFA) 108 (90 BASE) MCG/ACT inhaler Inhale 2 puffs into the lungs every 6 (six) hours as needed for wheezing or shortness of breath. 1 Inhaler 2  . aspirin 81 MG tablet Take 81 mg by mouth daily.    . benzonatate (TESSALON) 100 MG capsule Take 1 capsule (100 mg total) by mouth 3 (three) times daily as needed for cough. 1 every 8 hours as needed 40 capsule 0  . Cholecalciferol (VITAMIN D3) 1000 UNITS CAPS 2 caps daily  0  . Dextromethorphan-Guaifenesin (MUCINEX DM MAXIMUM STRENGTH) 60-1200 MG TB12 Take 1 tablet by mouth daily as needed.     Marland Kitchen PAXIL CR 25 MG 24 hr tablet take 1 tablet by mouth every morning 90 tablet 1  . pravastatin (PRAVACHOL) 20 MG tablet 1 tablet daily  0  . SPIRIVA HANDIHALER 18 MCG inhalation capsule Inhale contents of 1 capsule daily  1  . SYMBICORT 160-4.5 MCG/ACT inhaler Inhale  2 puffs into the lungs 2 (two) times daily.  0   No current facility-administered medications on file prior to visit.    BP 130/70 mmHg  Pulse 80  Temp(Src) 98 F (36.7 C) (Oral)  Resp 18  Ht 5\' 3"  (1.6 m)  Wt 127 lb (57.607 kg)  BMI 22.50 kg/m2  SpO2 96%      Review of Systems  Constitutional: Negative.   HENT: Negative for congestion, dental problem, hearing loss, rhinorrhea, sinus pressure, sore throat and tinnitus.   Eyes: Negative for pain, discharge and visual disturbance.  Respiratory: Negative for cough and shortness of breath.   Cardiovascular: Negative for chest pain, palpitations and leg swelling.  Gastrointestinal: Negative for nausea, vomiting, abdominal pain, diarrhea, constipation, blood in stool and abdominal distention.  Genitourinary: Negative for dysuria, urgency,  frequency, hematuria, flank pain, vaginal bleeding, vaginal discharge, difficulty urinating, vaginal pain and pelvic pain.  Musculoskeletal: Negative for joint swelling, arthralgias and gait problem.  Skin: Negative for rash.  Neurological: Negative for dizziness, syncope, speech difficulty, weakness, numbness and headaches.  Hematological: Negative for adenopathy.  Psychiatric/Behavioral: Negative for behavioral problems, dysphoric mood and agitation. The patient is nervous/anxious.        Objective:   Physical Exam  Constitutional: She is oriented to person, place, and time. She appears well-developed and well-nourished.  HENT:  Head: Normocephalic.  Right Ear: External ear normal.  Left Ear: External ear normal.  Mouth/Throat: Oropharynx is clear and moist.  Eyes: Conjunctivae and EOM are normal. Pupils are equal, round, and reactive to light.  Neck: Normal range of motion. Neck supple. No thyromegaly present.  Cardiovascular: Normal rate, regular rhythm, normal heart sounds and intact distal pulses.   Pulmonary/Chest: Effort normal and breath sounds normal. No respiratory distress. She has no rales.  Abdominal: Soft. Bowel sounds are normal. She exhibits no mass. There is no tenderness.  Musculoskeletal: Normal range of motion.  Lymphadenopathy:    She has no cervical adenopathy.  Neurological: She is alert and oriented to person, place, and time.  Skin: Skin is warm and dry. No rash noted.  Psychiatric: She has a normal mood and affect. Her behavior is normal.          Assessment & Plan:   Exacerbation of COPD stable Anxiety disorder History depression History of osteoarthritis Solitary pulmonary nodule.  Plan for follow-up chest CT in 6-12 months  Pulmonary follow-up as scheduled.  PFTs Recheck here in 6 months or as needed

## 2014-04-24 NOTE — Patient Instructions (Signed)

## 2014-04-25 ENCOUNTER — Other Ambulatory Visit: Payer: Self-pay | Admitting: *Deleted

## 2014-04-25 MED ORDER — BENZONATATE 100 MG PO CAPS: 100.0000 mg | ORAL_CAPSULE | Freq: Three times a day (TID) | ORAL | Status: DC | PRN

## 2014-04-25 MED ORDER — PRAVASTATIN SODIUM 20 MG PO TABS: 20.0000 mg | ORAL_TABLET | Freq: Every day | ORAL | Status: DC

## 2014-05-11 ENCOUNTER — Telehealth: Payer: Self-pay | Admitting: Adult Health

## 2014-05-11 NOTE — Telephone Encounter (Signed)
Called spoke with pt. She was very upset that we do not have this. She is demanding to speak with TP directly. She reports we should not have gotten rid of her records. She reports this was not her "patient instructions" but was her medical records and reports it stated this on top. Pt reports she now has to pay 65 cents per page now to get these records. Pt did not want to discuss this further with me and reports she wants TP to call her instead.  Discussed w/ TP and will send message to her

## 2014-05-11 NOTE — Addendum Note (Signed)
Addended by: Parke Poisson E on: 05/11/2014 01:24 PM   Modules accepted: Orders, Medications

## 2014-05-11 NOTE — Telephone Encounter (Signed)
Spoke with pt regarding discharge summary .  Scan records have not been found .  Apology made to pt for [facility] called with summary to be faxed for pt to p/u

## 2014-05-11 NOTE — Telephone Encounter (Signed)
Kim [name] is working on this

## 2014-05-11 NOTE — Telephone Encounter (Signed)
Discussed with TP: so sorry, if we do not have a copy it was likely the patient instructions that we don't typically keep to scan into the chart.  No other records have been received from [facility].  Thanks.

## 2014-05-11 NOTE — Telephone Encounter (Signed)
Spoke with the pt  She states that at her last visit here with TP opn 04/19/13 she provided [place] with her hospital d/c summary from [facility]  She is needing a copy of this now so she can give to Dr Kathreen Devoid and wants to pick this up  I do not see that it has been scanned into the chart yet  Checked the scan folder and it is not there either  I advised the pt that we do not have the records and urged her to contact the med rec dept for this hospital and request them  She was upset and wants to know where the records are  TP, do you remember anything about this? Please advise thanks~!

## 2014-05-12 ENCOUNTER — Other Ambulatory Visit: Payer: Self-pay

## 2014-05-12 MED ORDER — PAROXETINE HCL ER 25 MG PO TB24: 25.0000 mg | ORAL_TABLET | Freq: Every morning | ORAL | Status: DC

## 2014-05-12 NOTE — Telephone Encounter (Signed)
Had called evening of 2/4 [facility] in [facility] and spoke with Baker Janus in medical records @ [phone] Explained situation to [facility] and asked if records request could be faxed on letter head > yes, to [phone] and mention that pt has an upcoming appt.  Per Baker Janus HIM was closed for the day.  Letter generated earlier this morning and faxed to the above number As of yet, no records have been received  Called spoke with patient and discussed this with her.  Advised that if her records have not been received by Monday I will call Barbie Banner again to check on status and will keep her updated.  Pt okay with this and voiced her understanding.  Will keep in by inbasket to follow.

## 2014-05-12 NOTE — Telephone Encounter (Signed)
Rx request for Paxil CR.  Rx sent to pharmacy for 6 months.

## 2014-05-18 NOTE — Telephone Encounter (Signed)
Jess, have these records been received yet? Thanks.

## 2014-05-19 NOTE — Telephone Encounter (Signed)
No, I have not received the records yet [facility] again and spoke with Ronelle in HIM who reported that the request was never received Verified the fax number and will fax again

## 2014-05-23 NOTE — Telephone Encounter (Signed)
Jess have we received these records? Thanks!

## 2014-05-24 NOTE — Telephone Encounter (Signed)
Called and informed pt we have not yet received the records but once they have been received then we will call her back. Pt verbalized understanding and denied any further questions or concerns at this time.   Will forward to Jess as FYI.

## 2014-05-24 NOTE — Telephone Encounter (Signed)
[phone], pt cb to see if results are ready for her to come pick up.

## 2014-05-31 ENCOUNTER — Telehealth: Payer: Self-pay | Admitting: Internal Medicine

## 2014-05-31 MED ORDER — CIPROFLOXACIN HCL 500 MG PO TABS: 500.0000 mg | ORAL_TABLET | Freq: Two times a day (BID) | ORAL | Status: DC

## 2014-05-31 NOTE — Telephone Encounter (Signed)
Discussed with Dr. Raliegh Ip, verbal order given for Cipro 500 mg BID x 3 days.  Spoke to pt, told her Dr. Raliegh Ip said he will treat her with Cipro 500 mg one tablet twice a day x 3 days and to push fluids. Rx sent to pharmacy. Pt verbalized understanding.

## 2014-05-31 NOTE — Telephone Encounter (Signed)
Pt states she has sever burning when urinating.  She picked up an  AZO urine test kit today and states the test revealed she was positive for leukocyte but negative for nitrites.  Pt wants to know if she needs to schedule an ov to be evaluated of if something can be sent in to the pharmacy for her.  Please follow up with patient. °

## 2014-06-14 ENCOUNTER — Telehealth: Payer: Self-pay | Admitting: Adult Health

## 2014-06-14 NOTE — Telephone Encounter (Signed)
No records on the fax yet

## 2014-06-14 NOTE — Telephone Encounter (Signed)
[place] and spoke with Baker Janus in medical records She checked on the status of the request >> it has been received and the records "are on the fax"  Will continue to follow

## 2014-06-14 NOTE — Telephone Encounter (Signed)
Patient would like for Jess to check up on this, she has an appointment with her Cardiologist tomorrow and she says this has been going on since January and she needs those records for her appointment tomorrow.  She wants Jess to call her, she said that if she doesn't hear back today, she will call us back tomorrow.  She needs those records.    To Jess for F/U

## 2014-06-14 NOTE — Telephone Encounter (Signed)
No records yet [facility] again and spoke with [facility] - she stated that the records are on her fax being transmitted, 47pages.  Wonder if the fax will not begin coming through here until her transmission is complete?  Called spoke with patient and apologized both for the delay and any furthered inconvenience.  Pt is okay.  Her cardiologist appt is not tomorrow, it is 4.7.16.  Advised pt that if the records are received prior to 5.30pm I will call her today.  Otherwise I will call her tomorrow.  Reminder left on my desk.  Will route note bck to my inbasket to continue to follow.

## 2014-06-14 NOTE — Telephone Encounter (Signed)
Still no records

## 2014-06-14 NOTE — Telephone Encounter (Signed)
I've never received the records after requesting them multiple times (and I had even called to verify the fax number!!) I'm so sorry

## 2014-06-14 NOTE — Telephone Encounter (Signed)
[facility] records @ [phone] again and spoke with Ronelle (whom I had spoken with previously when requesting these records) Ronelle stated that they have no records of a release being received.  Ronelle advised me to resend the request to [phone] (same number I had faxed the requests to previously).  Asked Ronelle for her call back number to ensure this is gets taken care of today >> [phone].  STAT request on fax cover sheet faxed to the number above with an activity report printed as well verifying that the fax went through. Will call Ronelle shortly to ensure fax was received.

## 2014-06-14 NOTE — Telephone Encounter (Signed)
No records received Will check on this tomorrow and call [place] if necessary [facility] x1 for patient informing her of the above

## 2014-06-14 NOTE — Telephone Encounter (Signed)
Patient would like copy of her D/C summary from [facility] that she gave to TP at her last OV.  She said that she needs this ASAP.    Jess, have you received these records?    Please advise.

## 2014-06-15 NOTE — Telephone Encounter (Signed)
Spoke with patient, states that she listened to message again and it was [name] that called her.  I checked with [name] and she does have the records. Patient advised of this this and I also let Janett Billow know these have been received.  Nothing further needed.

## 2014-06-15 NOTE — Telephone Encounter (Signed)
Records have not been received as this AM Pt states that someone called her last night around 530 stating that records were received and she could pick them up after 8am today.  I advised the patient after speaking with Janett Billow that no one from our floor/office called her stating such. We did however call her and let her know that we had NOT received the records yet and would call in the AM to request these again.   Called Medical Records in our building and they do not have anything on the patient either.  Patient states that she is going to listen to the message again and call back to let us know who called her.

## 2014-07-07 ENCOUNTER — Other Ambulatory Visit: Payer: Self-pay | Admitting: Internal Medicine

## 2014-07-13 ENCOUNTER — Ambulatory Visit (INDEPENDENT_AMBULATORY_CARE_PROVIDER_SITE_OTHER): Payer: 59 | Admitting: Cardiovascular Disease

## 2014-07-13 ENCOUNTER — Encounter: Payer: Self-pay | Admitting: Cardiovascular Disease

## 2014-07-13 VITALS — BP 130/60 | HR 64 | Ht 63.0 in | Wt 127.0 lb

## 2014-07-13 DIAGNOSIS — I2510 Atherosclerotic heart disease of native coronary artery without angina pectoris: Secondary | ICD-10-CM

## 2014-07-13 MED ORDER — PRAVASTATIN SODIUM 20 MG PO TABS: 20.0000 mg | ORAL_TABLET | Freq: Every evening | ORAL | Status: DC

## 2014-07-13 NOTE — Patient Instructions (Addendum)
Your physician wants you to follow-up in:  12 months.  You will receive a reminder letter in the mail two months in advance. If you don't receive a letter, please call our office to schedule the follow-up appointment.  Resume Pravastatin 20 mg by mouth daily

## 2014-07-13 NOTE — Progress Notes (Signed)
Chief Complaint  Patient presents with  . Shortness of Breath     History of Present Illness: 59 yo female with history of COPD, HLD, tobacco abuse here today as a new patient to establish cardiology care. She was hospitalized in [place] December 2015 with COPD exacerbation. Troponin was elevated at 2. Cardiac catheterization showed no obstructive CAD per pt. This is confirmed by progress notes from [facility]. NO cath report is available. It is clear that she had a COPD exacerbation and she improved with treatment of her COPD. She has felt better post discharge. No chest pain. She does have baseline SOB due to her lung disease. No LE edema. She quit smoking in September 2015.   Primary Care Physician: [name], MD   Past Medical History  Diagnosis Date  . C8 RADICULOPATHY 06/05/2009  . COPD 08/31/2007  . DEPRESSION 11/19/2006  . DYSLIPIDEMIA 03/12/2009  . 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 OSTEOARTHRITIS 06/05/2009  . PARESTHESIA 08/24/2007  . TOBACCO ABUSE 01/25/2010    Past Surgical History  Procedure Laterality Date  . Appendectomy  1969  . Cholecystectomy  1989  . Exploratory laparotomy  1969  . Collapse lung  1990    Current Outpatient Prescriptions  Medication Sig Dispense Refill  . albuterol (PROVENTIL HFA;VENTOLIN HFA) 108 (90 BASE) MCG/ACT inhaler Inhale 2 puffs into the lungs every 4 (four) hours as needed for wheezing or shortness of breath.    . ALPRAZolam (XANAX) 0.5 MG tablet take 1/2 to 1 tablet by mouth three times a day if needed 90 tablet 2  . benzonatate (TESSALON) 100 MG capsule Take 100 mg by mouth every 8 (eight) hours as needed for cough.    . Cholecalciferol (VITAMIN D3) 1000 UNITS CAPS Take 1 capsule by mouth 2 (two) times daily.   0  . HYDROcodone-acetaminophen (NORCO) 7.5-325 MG per tablet Take 1 tablet by mouth every 6 (six) hours as needed for moderate pain. 90 tablet 0  . nystatin (MYCOSTATIN) 100000 UNIT/ML suspension Take 5  mLs (500,000 Units total) by mouth 4 (four) times daily. 60 mL 0  . PARoxetine (PAXIL CR) 25 MG 24 hr tablet Take 1 tablet (25 mg total) by mouth every morning. 90 tablet 1  . SPIRIVA HANDIHALER 18 MCG inhalation capsule Inhale contents of 1 capsule daily  1  . SYMBICORT 160-4.5 MCG/ACT inhaler Inhale 2 puffs into the lungs 2 (two) times daily.  0   No current facility-administered medications for this visit.    Allergies  Allergen Reactions  . Penicillins Hives    History   Social History  . Marital Status: Married    Spouse Name: N/A  . Number of Children: 2  . Years of Education: N/A   Occupational History  . Patient care aide-sits with elderly lady    Social History Main Topics  . Smoking status: Former Smoker -- 1.50 packs/day for 30 years    Types: Cigarettes    Quit date: 12/21/2013  . Smokeless tobacco: Never Used  . Alcohol Use: No  . Drug Use: No  . Sexual Activity: Not on file   Other Topics Concern  . Not on file   Social History Narrative    Family History  Problem Relation Age of Onset  . Cancer Mother     lung  . Heart attack Mother   . Hypertension Sister   . Multiple sclerosis Brother   . Multiple sclerosis Other   . Alcohol abuse Other   .  Arthritis Other   . Diabetes Other   . Kidney disease Other   . Cancer Other     lung,ovarian,skin, uterine  . Stroke Other   . Heart disease Other   . Melanoma Other   . Diabetes Sister   . Rheum arthritis Sister   . Thyroid disease Sister   . Colon cancer Neg Hx   . Heart attack Maternal Uncle   . Heart attack Other     NEICE  . Heart attack Other     NEPHEW  . Hypertension Brother   . Stroke Maternal Grandmother     Review of Systems:  As stated in the HPI and otherwise negative.   BP 130/60 mmHg  Pulse 64  Ht 5\' 3"  (1.6 m)  Wt 127 lb (57.607 kg)  BMI 22.50 kg/m2  Physical Examination: General: Well developed, well nourished, NAD HEENT: OP clear, mucus membranes moist SKIN: warm,  dry. No rashes. Neuro: No focal deficits Musculoskeletal: Muscle strength 5/5 all ext Psychiatric: Mood and affect normal Neck: No JVD, no carotid bruits, no thyromegaly, no lymphadenopathy. Lungs:Clear bilaterally, no wheezes, rhonci, crackles Cardiovascular: Regular rate and rhythm. No murmurs, gallops or rubs. Abdomen:Soft. Bowel sounds present. Non-tender.  Extremities: No lower extremity edema. Pulses are 2 + in the bilateral DP/PT.  EKG:  EKG is ordered today. The ekg ordered today demonstrates NSR, rate 64 bpm. Poor R wave progression precordial leads.   Recent Labs: 11/10/2013: TSH 0.18*   Lipid Panel    Component Value Date/Time   CHOL 215* 12/29/2012 1209   TRIG 79.0 12/29/2012 1209   HDL 43.80 12/29/2012 1209   CHOLHDL 5 12/29/2012 1209   VLDL 15.8 12/29/2012 1209   LDLCALC 131* 01/20/2012 0933   LDLDIRECT 155.4 12/29/2012 1209     Wt Readings from Last 3 Encounters:  07/13/14 127 lb (57.607 kg)  04/24/14 127 lb (57.607 kg)  04/19/14 123 lb (55.792 kg)     Other studies Reviewed: Additional studies/ records that were reviewed today include: Records from [facility]. Review of the above records demonstrates:   Assessment and Plan:   1. CAD: She has evidence of CAD on her CT chest. Cardiac cath December 2015 in [place], [place]. She was told there was no significant blockages. I will get records of the cath from [facility]. Continue statin.   2. Tobacco abuse, in remission: She does not wish to start back smoking  3. Hyperlipidemia: Continue statin.   Current medicines are reviewed at length with the patient today.  The patient does not have concerns regarding medicines.  The following changes have been made:  no change  Labs/ tests ordered today include:  No orders of the defined types were placed in this encounter.    Disposition:   FU with me in 12  months  Signed, Lauree Chandler, MD 07/13/2014 9:58 AM    [address], [place], [place]  [address] Phone: [phone]; Fax: [phone]

## 2014-08-21 ENCOUNTER — Encounter: Payer: Self-pay | Admitting: Internal Medicine

## 2014-08-21 ENCOUNTER — Ambulatory Visit (INDEPENDENT_AMBULATORY_CARE_PROVIDER_SITE_OTHER): Payer: 59 | Admitting: Internal Medicine

## 2014-08-21 VITALS — BP 150/80 | HR 73 | Temp 98.0°F | Resp 20 | Ht 63.0 in | Wt 128.0 lb

## 2014-08-21 DIAGNOSIS — M150 Primary generalized (osteo)arthritis: Secondary | ICD-10-CM | POA: Diagnosis not present

## 2014-08-21 DIAGNOSIS — R634 Abnormal weight loss: Secondary | ICD-10-CM | POA: Diagnosis not present

## 2014-08-21 DIAGNOSIS — R350 Frequency of micturition: Secondary | ICD-10-CM | POA: Diagnosis not present

## 2014-08-21 DIAGNOSIS — M545 Low back pain, unspecified: Secondary | ICD-10-CM

## 2014-08-21 DIAGNOSIS — M159 Polyosteoarthritis, unspecified: Secondary | ICD-10-CM

## 2014-08-21 LAB — POCT URINALYSIS DIPSTICK
BILIRUBIN UA: NEGATIVE
Glucose, UA: NEGATIVE
KETONES UA: NEGATIVE
Leukocytes, UA: NEGATIVE
Nitrite, UA: NEGATIVE
PH UA: 5.5
Protein, UA: NEGATIVE
RBC UA: NEGATIVE
Spec Grav, UA: 1.025
Urobilinogen, UA: 0.2

## 2014-08-21 MED ORDER — HYDROCODONE-ACETAMINOPHEN 7.5-325 MG PO TABS: 1.0000 | ORAL_TABLET | Freq: Four times a day (QID) | ORAL | Status: DC | PRN

## 2014-08-21 NOTE — Progress Notes (Signed)
Subjective:    Patient ID: Kim [name], female    DOB: Jun 26, 1955, 59 y.o.   MRN: [id number]  HPI 59 year old patient who presents with a two-week history of left flank and lumbar pain.  Pain is aggravated by movement, especially twisting at the waist.  No trauma. Her palmar status is stable She does have a history of a left lower lobe pulmonary nodule and follow-up chest CT is scheduled for August of this year She has been taken anti-inflammatory medications and applying heat  Past Medical History  Diagnosis Date  . C8 RADICULOPATHY 06/05/2009  . COPD 08/31/2007  . DEPRESSION 11/19/2006  . DYSLIPIDEMIA 03/12/2009  . 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 OSTEOARTHRITIS 06/05/2009  . PARESTHESIA 08/24/2007  . TOBACCO ABUSE 01/25/2010    History   Social History  . Marital Status: Married    Spouse Name: N/A  . Number of Children: 2  . Years of Education: N/A   Occupational History  . Patient care aide-sits with elderly lady    Social History Main Topics  . Smoking status: Former Smoker -- 1.50 packs/day for 30 years    Types: Cigarettes    Quit date: 12/21/2013  . Smokeless tobacco: Never Used  . Alcohol Use: No  . Drug Use: No  . Sexual Activity: Not on file   Other Topics Concern  . Not on file   Social History Narrative    Past Surgical History  Procedure Laterality Date  . Appendectomy  1969  . Cholecystectomy  1989  . Exploratory laparotomy  1969  . Collapse lung  1990    Family History  Problem Relation Age of Onset  . Cancer Mother     lung  . Heart attack Mother   . Hypertension Sister   . Multiple sclerosis Brother   . Multiple sclerosis Other   . Alcohol abuse Other   . Arthritis Other   . Diabetes Other   . Kidney disease Other   . Cancer Other     lung,ovarian,skin, uterine  . Stroke Other   . Heart disease Other   . Melanoma Other   . Diabetes Sister   . Rheum arthritis Sister   . Thyroid disease Sister   . Colon cancer Neg Hx   . Heart  attack Maternal Uncle   . Heart attack Other     NEICE  . Heart attack Other     NEPHEW  . Hypertension Brother   . Stroke Maternal Grandmother     Allergies  Allergen Reactions  . Penicillins Hives    Current Outpatient Prescriptions on File Prior to Visit  Medication Sig Dispense Refill  . albuterol (PROVENTIL HFA;VENTOLIN HFA) 108 (90 BASE) MCG/ACT inhaler Inhale 2 puffs into the lungs every 4 (four) hours as needed for wheezing or shortness of breath.    . ALPRAZolam (XANAX) 0.5 MG tablet take 1/2 to 1 tablet by mouth three times a day if needed 90 tablet 2  . benzonatate (TESSALON) 100 MG capsule Take 100 mg by mouth every 8 (eight) hours as needed for cough.    . Cholecalciferol (VITAMIN D3) 1000 UNITS CAPS Take 1 capsule by mouth 2 (two) times daily.   0  . nystatin (MYCOSTATIN) 100000 UNIT/ML suspension Take 5 mLs (500,000 Units total) by mouth 4 (four) times daily. 60 mL 0  . PARoxetine (PAXIL CR) 25 MG 24 hr tablet Take 1 tablet (25 mg total) by mouth every morning. 90 tablet 1  . pravastatin (PRAVACHOL) 20  MG tablet Take 1 tablet (20 mg total) by mouth every evening. 90 tablet 3  . SPIRIVA HANDIHALER 18 MCG inhalation capsule Inhale contents of 1 capsule daily  1  . SYMBICORT 160-4.5 MCG/ACT inhaler Inhale 2 puffs into the lungs 2 (two) times daily.  0   No current facility-administered medications on file prior to visit.    BP 150/80 mmHg  Pulse 73  Temp(Src) 98 F (36.7 C) (Oral)  Resp 20  Ht 5\' 3"  (1.6 m)  Wt 128 lb (58.06 kg)  BMI 22.68 kg/m2  SpO2 95%      Review of Systems  Constitutional: Negative.   HENT: Negative for congestion, dental problem, hearing loss, rhinorrhea, sinus pressure, sore throat and tinnitus.   Eyes: Negative for pain, discharge and visual disturbance.  Respiratory: Negative for cough and shortness of breath.   Cardiovascular: Negative for chest pain, palpitations and leg swelling.  Gastrointestinal: Negative for nausea,  vomiting, abdominal pain, diarrhea, constipation, blood in stool and abdominal distention.  Genitourinary: Positive for flank pain. Negative for dysuria, urgency, frequency, hematuria, vaginal bleeding, vaginal discharge, difficulty urinating, vaginal pain and pelvic pain.  Musculoskeletal: Negative for joint swelling, arthralgias and gait problem.  Skin: Negative for rash.  Neurological: Negative for dizziness, syncope, speech difficulty, weakness, numbness and headaches.  Hematological: Negative for adenopathy.  Psychiatric/Behavioral: Negative for behavioral problems, dysphoric mood and agitation. The patient is not nervous/anxious.        Objective:   Physical Exam  Constitutional: She is oriented to person, place, and time. She appears well-developed and well-nourished.  HENT:  Head: Normocephalic.  Right Ear: External ear normal.  Left Ear: External ear normal.  Mouth/Throat: Oropharynx is clear and moist.  Eyes: Conjunctivae and EOM are normal. Pupils are equal, round, and reactive to light.  Neck: Normal range of motion. Neck supple. No thyromegaly present.  Cardiovascular: Normal rate, regular rhythm, normal heart sounds and intact distal pulses.   Pulmonary/Chest: Effort normal and breath sounds normal.  Abdominal: Soft. Bowel sounds are normal. She exhibits no mass. There is no tenderness.  Musculoskeletal: Normal range of motion.  Tight tender left sided lumbar musculature.  Pain aggravated by twisting  Lymphadenopathy:    She has no cervical adenopathy.  Neurological: She is alert and oriented to person, place, and time.  Skin: Skin is warm and dry. No rash noted.  Psychiatric: She has a normal mood and affect. Her behavior is normal.          Assessment & Plan:   Lumbar strain.  Will continue anti-inflammatories.  Will continue rest and warm compresses COPD stable History tobacco use.  Remains abstinent  Return in 3 months for follow-up.  Schedule chest CT at  that time

## 2014-08-21 NOTE — Progress Notes (Signed)
Pre visit review using our clinic review tool, if applicable. No additional management support is needed unless otherwise documented below in the visit note. 

## 2014-08-21 NOTE — Patient Instructions (Signed)
You  may move around, but avoid painful motions and activities.  Apply heat to the sore area for 15 to 20 minutes 3 or 4 times daily for the next two to 3 days. 

## 2014-08-28 ENCOUNTER — Telehealth: Payer: Self-pay | Admitting: Family Medicine

## 2014-08-28 NOTE — Telephone Encounter (Signed)
Left a message for the pt to return my call.  Need to see if she has had a recent mammogram. 

## 2014-09-05 ENCOUNTER — Encounter: Payer: Self-pay | Admitting: Internal Medicine

## 2014-09-05 ENCOUNTER — Ambulatory Visit (INDEPENDENT_AMBULATORY_CARE_PROVIDER_SITE_OTHER): Payer: 59 | Admitting: Internal Medicine

## 2014-09-05 VITALS — BP 128/70 | HR 77 | Temp 97.8°F | Resp 20 | Ht 63.0 in | Wt 125.0 lb

## 2014-09-05 DIAGNOSIS — J441 Chronic obstructive pulmonary disease with (acute) exacerbation: Secondary | ICD-10-CM

## 2014-09-05 MED ORDER — PREDNISONE 20 MG PO TABS: 20.0000 mg | ORAL_TABLET | Freq: Two times a day (BID) | ORAL | Status: DC

## 2014-09-05 NOTE — Progress Notes (Signed)
Pre visit review using our clinic review tool, if applicable. No additional management support is needed unless otherwise documented below in the visit note. 

## 2014-09-05 NOTE — Patient Instructions (Addendum)
Acute bronchitis symptoms for less than 10 days are generally not helped by antibiotics.  Take over-the-counter expectorants and cough medications such as  Mucinex DM.  Call if there is no improvement in 5 to 7 days or if  you develop worsening cough, fever, or new symptoms, such as shortness of breath or chest pain.   Get plenty of rest. Drink enough fluids to keep your urine clear or pale yellow (unless you have a medical condition that requires fluid restriction). Increasing fluids may help thin your respiratory secretions (sputum) and reduce chest congestion, and it will prevent dehydration. Take medicines only as directed by your health care provider. Avoid smoking and secondhand smoke. Exposure to cigarette smoke or irritating chemicals will make bronchitis worse. If you are a smoker, consider using nicotine gum or skin patches to help control withdrawal symptoms. Quitting smoking will help your lungs heal faster. Reduce the chances of another bout of acute bronchitis by washing your hands frequently, avoiding people with cold symptoms, and trying not to touch your hands to your mouth, nose, or eyes.

## 2014-09-05 NOTE — Progress Notes (Signed)
Subjective:    Patient ID: Kim [name], female    DOB: February 05, 1956, 59 y.o.   MRN: [id number]  HPI  59 year old patient has a history of COPD and prior tobacco use.  The past 4 days she has had increasing chest congestion, cough with associated wheezing.  She remains on inhalational medications.  She has noted increased albuterol use.  No fever.  She has been using Mucinex as well as hydrocodone for discomfort and cough  Past Medical History  Diagnosis Date  . C8 RADICULOPATHY 06/05/2009  . COPD 08/31/2007  . DEPRESSION 11/19/2006  . DYSLIPIDEMIA 03/12/2009  . 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 OSTEOARTHRITIS 06/05/2009  . PARESTHESIA 08/24/2007  . TOBACCO ABUSE 01/25/2010    History   Social History  . Marital Status: Married    Spouse Name: N/A  . Number of Children: 2  . Years of Education: N/A   Occupational History  . Patient care aide-sits with elderly lady    Social History Main Topics  . Smoking status: Former Smoker -- 1.50 packs/day for 30 years    Types: Cigarettes    Quit date: 12/21/2013  . Smokeless tobacco: Never Used  . Alcohol Use: No  . Drug Use: No  . Sexual Activity: Not on file   Other Topics Concern  . Not on file   Social History Narrative    Past Surgical History  Procedure Laterality Date  . Appendectomy  1969  . Cholecystectomy  1989  . Exploratory laparotomy  1969  . Collapse lung  1990    Family History  Problem Relation Age of Onset  . Cancer Mother     lung  . Heart attack Mother   . Hypertension Sister   . Multiple sclerosis Brother   . Multiple sclerosis Other   . Alcohol abuse Other   . Arthritis Other   . Diabetes Other   . Kidney disease Other   . Cancer Other     lung,ovarian,skin, uterine  . Stroke Other   . Heart disease Other   . Melanoma Other   . Diabetes Sister   . Rheum arthritis Sister   . Thyroid disease Sister   . Colon cancer Neg Hx   . Heart attack Maternal Uncle   . Heart attack Other     NEICE  .  Heart attack Other     NEPHEW  . Hypertension Brother   . Stroke Maternal Grandmother     Allergies  Allergen Reactions  . Penicillins Hives    Current Outpatient Prescriptions on File Prior to Visit  Medication Sig Dispense Refill  . albuterol (PROVENTIL HFA;VENTOLIN HFA) 108 (90 BASE) MCG/ACT inhaler Inhale 2 puffs into the lungs every 4 (four) hours as needed for wheezing or shortness of breath.    . ALPRAZolam (XANAX) 0.5 MG tablet take 1/2 to 1 tablet by mouth three times a day if needed 90 tablet 2  . Cholecalciferol (VITAMIN D3) 1000 UNITS CAPS Take 1 capsule by mouth 2 (two) times daily.   0  . HYDROcodone-acetaminophen (NORCO) 7.5-325 MG per tablet Take 1 tablet by mouth every 6 (six) hours as needed for moderate pain. 90 tablet 0  . nystatin (MYCOSTATIN) 100000 UNIT/ML suspension Take 5 mLs (500,000 Units total) by mouth 4 (four) times daily. 60 mL 0  . PARoxetine (PAXIL CR) 25 MG 24 hr tablet Take 1 tablet (25 mg total) by mouth every morning. 90 tablet 1  . pravastatin (PRAVACHOL) 20 MG tablet Take 1 tablet (  20 mg total) by mouth every evening. 90 tablet 3  . SPIRIVA HANDIHALER 18 MCG inhalation capsule Inhale contents of 1 capsule daily  1  . SYMBICORT 160-4.5 MCG/ACT inhaler Inhale 2 puffs into the lungs 2 (two) times daily.  0   No current facility-administered medications on file prior to visit.    BP 128/70 mmHg  Pulse 77  Temp(Src) 97.8 F (36.6 C) (Oral)  Resp 20  Ht 5\' 3"  (1.6 m)  Wt 125 lb (56.7 kg)  BMI 22.15 kg/m2  SpO2 96%     Review of Systems  Constitutional: Positive for activity change, appetite change and fatigue.  HENT: Negative for congestion, dental problem, hearing loss, rhinorrhea, sinus pressure, sore throat and tinnitus.   Eyes: Negative for pain, discharge and visual disturbance.  Respiratory: Positive for cough and wheezing. Negative for shortness of breath.   Cardiovascular: Negative for chest pain, palpitations and leg swelling.    Gastrointestinal: Negative for nausea, vomiting, abdominal pain, diarrhea, constipation, blood in stool and abdominal distention.  Genitourinary: Negative for dysuria, urgency, frequency, hematuria, flank pain, vaginal bleeding, vaginal discharge, difficulty urinating, vaginal pain and pelvic pain.  Musculoskeletal: Negative for joint swelling, arthralgias and gait problem.  Skin: Negative for rash.  Neurological: Negative for dizziness, syncope, speech difficulty, weakness, numbness and headaches.  Hematological: Negative for adenopathy.  Psychiatric/Behavioral: Negative for behavioral problems, dysphoric mood and agitation. The patient is not nervous/anxious.        Objective:   Physical Exam  Constitutional: She is oriented to person, place, and time. She appears well-developed and well-nourished.  Frequent paroxysms of coughing  HENT:  Head: Normocephalic.  Right Ear: External ear normal.  Left Ear: External ear normal.  Mouth/Throat: Oropharynx is clear and moist.  Eyes: Conjunctivae and EOM are normal. Pupils are equal, round, and reactive to light.  Neck: Normal range of motion. Neck supple. No thyromegaly present.  Cardiovascular: Normal rate, regular rhythm, normal heart sounds and intact distal pulses.   Pulmonary/Chest: Effort normal.  Few scattered rhonchi.  No active wheezing.  O2 saturation 96% No tachycardia or increased work of breathing  Abdominal: Soft. Bowel sounds are normal. She exhibits no mass. There is no tenderness.  Musculoskeletal: Normal range of motion.  Lymphadenopathy:    She has no cervical adenopathy.  Neurological: She is alert and oriented to person, place, and time.  Skin: Skin is warm and dry. No rash noted.  Psychiatric: She has a normal mood and affect. Her behavior is normal.          Assessment & Plan:   Viral URI with cough COPD with exacerbation  We'll continue expectorants and maintenance bronchodilators Will treat with oral  prednisone for 5 days

## 2014-09-11 ENCOUNTER — Telehealth: Payer: Self-pay | Admitting: Internal Medicine

## 2014-09-11 MED ORDER — PREDNISONE 20 MG PO TABS: ORAL_TABLET | ORAL | Status: DC

## 2014-09-11 MED ORDER — BUDESONIDE-FORMOTEROL FUMARATE 160-4.5 MCG/ACT IN AERO: 2.0000 | INHALATION_SPRAY | Freq: Two times a day (BID) | RESPIRATORY_TRACT | Status: DC

## 2014-09-11 NOTE — Telephone Encounter (Signed)
Spoke to pt, told her Rx sent to pharmacy for Prednisone 20 mg and directions as follows. Pt verbalized understanding and said needs refill on Symbicort. Told pt will send Rx to pharmacy for her. Pt verbalized understanding.

## 2014-09-11 NOTE — Telephone Encounter (Signed)
Retreat with prednisone 20 mg One twice daily for 5 days Then one daily for 5 days Then 1 half daily for 5 days Then discontinue

## 2014-09-11 NOTE — Telephone Encounter (Signed)
Please see message and advise 

## 2014-09-11 NOTE — Telephone Encounter (Signed)
Pt called to say that she has finish the prednisone  and still can not breathe. She is asking if she need to come in or if there is something else she can take .

## 2014-09-21 ENCOUNTER — Ambulatory Visit (INDEPENDENT_AMBULATORY_CARE_PROVIDER_SITE_OTHER): Payer: 59 | Admitting: Internal Medicine

## 2014-09-21 ENCOUNTER — Encounter: Payer: Self-pay | Admitting: Internal Medicine

## 2014-09-21 VITALS — BP 150/90 | HR 69 | Temp 98.4°F | Resp 20 | Ht 63.0 in | Wt 128.0 lb

## 2014-09-21 DIAGNOSIS — Z720 Tobacco use: Secondary | ICD-10-CM | POA: Diagnosis not present

## 2014-09-21 DIAGNOSIS — J441 Chronic obstructive pulmonary disease with (acute) exacerbation: Secondary | ICD-10-CM | POA: Diagnosis not present

## 2014-09-21 DIAGNOSIS — F17200 Nicotine dependence, unspecified, uncomplicated: Secondary | ICD-10-CM

## 2014-09-21 MED ORDER — AZITHROMYCIN 250 MG PO TABS: ORAL_TABLET | ORAL | Status: DC

## 2014-09-21 NOTE — Progress Notes (Signed)
Pre visit review using our clinic review tool, if applicable. No additional management support is needed unless otherwise documented below in the visit note. 

## 2014-09-21 NOTE — Progress Notes (Signed)
Subjective:    Patient ID: Kim [name], female    DOB: 09-25-1955, 59 y.o.   MRN: [id number]  HPI  59 year old patient who has a history of COPD.  She presents today with a chief complaint of persistent cough, chest congestion and general sense of unwellness.  She describes some chest tightness.  She is planning on driving to [place] in 10 days and is worried about her clinical status and the long drive.  She describes a cough.  Productive of thick sputum.  She is completing her second round of a prednisone taper.  No fever.  No wheezing. Past Medical History  Diagnosis Date  . C8 RADICULOPATHY 06/05/2009  . COPD 08/31/2007  . DEPRESSION 11/19/2006  . DYSLIPIDEMIA 03/12/2009  . 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 OSTEOARTHRITIS 06/05/2009  . PARESTHESIA 08/24/2007  . TOBACCO ABUSE 01/25/2010    History   Social History  . Marital Status: Married    Spouse Name: N/A  . Number of Children: 2  . Years of Education: N/A   Occupational History  . Patient care aide-sits with elderly lady    Social History Main Topics  . Smoking status: Former Smoker -- 1.50 packs/day for 30 years    Types: Cigarettes    Quit date: 12/21/2013  . Smokeless tobacco: Never Used  . Alcohol Use: No  . Drug Use: No  . Sexual Activity: Not on file   Other Topics Concern  . Not on file   Social History Narrative    Past Surgical History  Procedure Laterality Date  . Appendectomy  1969  . Cholecystectomy  1989  . Exploratory laparotomy  1969  . Collapse lung  1990    Family History  Problem Relation Age of Onset  . Cancer Mother     lung  . Heart attack Mother   . Hypertension Sister   . Multiple sclerosis Brother   . Multiple sclerosis Other   . Alcohol abuse Other   . Arthritis Other   . Diabetes Other   . Kidney disease Other   . Cancer Other     lung,ovarian,skin, uterine  . Stroke Other   . Heart disease Other   . Melanoma Other   . Diabetes Sister   . Rheum arthritis Sister     . Thyroid disease Sister   . Colon cancer Neg Hx   . Heart attack Maternal Uncle   . Heart attack Other     NEICE  . Heart attack Other     NEPHEW  . Hypertension Brother   . Stroke Maternal Grandmother     Allergies  Allergen Reactions  . Penicillins Hives    Current Outpatient Prescriptions on File Prior to Visit  Medication Sig Dispense Refill  . albuterol (PROVENTIL HFA;VENTOLIN HFA) 108 (90 BASE) MCG/ACT inhaler Inhale 2 puffs into the lungs every 4 (four) hours as needed for wheezing or shortness of breath.    . ALPRAZolam (XANAX) 0.5 MG tablet take 1/2 to 1 tablet by mouth three times a day if needed 90 tablet 2  . budesonide-formoterol (SYMBICORT) 160-4.5 MCG/ACT inhaler Inhale 2 puffs into the lungs 2 (two) times daily. 1 Inhaler 5  . Cholecalciferol (VITAMIN D3) 1000 UNITS CAPS Take 1 capsule by mouth 2 (two) times daily.   0  . Dextromethorphan-Guaifenesin (MUCINEX DM MAXIMUM STRENGTH) 60-1200 MG TB12 Take 1,200 mg by mouth 2 (two) times daily.    Marland Kitchen HYDROcodone-acetaminophen (NORCO) 7.5-325 MG per tablet Take 1 tablet by mouth  every 6 (six) hours as needed for moderate pain. 90 tablet 0  . nystatin (MYCOSTATIN) 100000 UNIT/ML suspension Take 5 mLs (500,000 Units total) by mouth 4 (four) times daily. 60 mL 0  . PARoxetine (PAXIL CR) 25 MG 24 hr tablet Take 1 tablet (25 mg total) by mouth every morning. 90 tablet 1  . pravastatin (PRAVACHOL) 20 MG tablet Take 1 tablet (20 mg total) by mouth every evening. 90 tablet 3  . predniSONE (DELTASONE) 20 MG tablet One twice daily for 5 days, then one daily for 5 days,then 1 half daily for 5 days,then discontinue 18 tablet 0  . SPIRIVA HANDIHALER 18 MCG inhalation capsule Inhale contents of 1 capsule daily  1   No current facility-administered medications on file prior to visit.    BP 150/90 mmHg  Pulse 69  Temp(Src) 98.4 F (36.9 C) (Oral)  Resp 20  Ht 5\' 3"  (1.6 m)  Wt 128 lb (58.06 kg)  BMI 22.68 kg/m2  SpO2  97%      Review of Systems  Constitutional: Negative.   HENT: Negative for congestion, dental problem, hearing loss, rhinorrhea, sinus pressure, sore throat and tinnitus.   Eyes: Negative for pain, discharge and visual disturbance.  Respiratory: Positive for cough, choking and shortness of breath.   Cardiovascular: Negative for chest pain, palpitations and leg swelling.  Gastrointestinal: Negative for nausea, vomiting, abdominal pain, diarrhea, constipation, blood in stool and abdominal distention.  Genitourinary: Negative for dysuria, urgency, frequency, hematuria, flank pain, vaginal bleeding, vaginal discharge, difficulty urinating, vaginal pain and pelvic pain.  Musculoskeletal: Negative for joint swelling, arthralgias and gait problem.  Skin: Negative for rash.  Neurological: Negative for dizziness, syncope, speech difficulty, weakness, numbness and headaches.  Hematological: Negative for adenopathy.  Psychiatric/Behavioral: Negative for behavioral problems, dysphoric mood and agitation. The patient is not nervous/anxious.        Objective:   Physical Exam  Constitutional: She is oriented to person, place, and time. She appears well-developed and well-nourished.  HENT:  Head: Normocephalic.  Right Ear: External ear normal.  Left Ear: External ear normal.  Mouth/Throat: Oropharynx is clear and moist.  Eyes: Conjunctivae and EOM are normal. Pupils are equal, round, and reactive to light.  Neck: Normal range of motion. Neck supple. No thyromegaly present.  Cardiovascular: Normal rate, regular rhythm, normal heart sounds and intact distal pulses.   Pulmonary/Chest: Effort normal. No respiratory distress. She has no wheezes.  Scattered coarse rhonchi.  No increased work of breathing O2 sat ration 97  Abdominal: Soft. Bowel sounds are normal. She exhibits no mass. There is no tenderness.  Musculoskeletal: Normal range of motion.  Lymphadenopathy:    She has no cervical  adenopathy.  Neurological: She is alert and oriented to person, place, and time.  Skin: Skin is warm and dry. No rash noted.  Psychiatric: She has a normal mood and affect. Her behavior is normal.          Assessment & Plan:  Exacerbation COPD.  Will treat with azithromycin.  We'll continue her prednisone taper and continue maintenance inhalational medications

## 2014-09-21 NOTE — Patient Instructions (Signed)
Drink as much fluid as you  can tolerate over the next few days  Take your antibiotic as prescribed until ALL of it is gone, but stop if you develop a rash, swelling, or any side effects of the medication.  Contact our office as soon as possible if  there are side effects of the medication.  Chronic Obstructive Pulmonary Disease Exacerbation  Chronic obstructive pulmonary disease (COPD) is a common lung problem. In COPD, the flow of air from the lungs is limited. COPD exacerbations are times that breathing gets worse and you need extra treatment. Without treatment they can be life threatening. If they happen often, your lungs can become more damaged. HOME CARE  Do not smoke.  Avoid tobacco smoke and other things that bother your lungs.  If given, take your antibiotic medicine as told. Finish the medicine even if you start to feel better.  Only take medicines as told by your doctor.  Drink enough fluids to keep your pee (urine) clear or pale yellow (unless your doctor has told you not to).  Use a cool mist machine (vaporizer).  If you use oxygen or a machine that turns liquid medicine into a mist (nebulizer), continue to use them as told.  Keep up with shots (vaccinations) as told by your doctor.  Exercise regularly.  Eat healthy foods.  Keep all doctor visits as told. GET HELP RIGHT AWAY IF:  You are very short of breath and it gets worse.  You have trouble talking.  You have bad chest pain.  You have blood in your spit (sputum).  You have a fever.  You keep throwing up (vomiting).  You feel weak, or you pass out (faint).  You feel confused.  You keep getting worse. MAKE SURE YOU:   Understand these instructions.  Will watch your condition.  Will get help right away if you are not doing well or get worse. Document Released: 03/13/2011 Document Revised: 01/12/2013 Document Reviewed: 11/26/2012 [facility] Patient Information 2015 [place], [place]. This information  is not intended to replace advice given to you by your health care provider. Make sure you discuss any questions you have with your health care provider.

## 2014-09-26 ENCOUNTER — Telehealth: Payer: Self-pay | Admitting: Internal Medicine

## 2014-09-26 MED ORDER — HYDROCODONE-ACETAMINOPHEN 7.5-325 MG PO TABS: 1.0000 | ORAL_TABLET | Freq: Four times a day (QID) | ORAL | Status: DC | PRN

## 2014-09-26 NOTE — Telephone Encounter (Addendum)
Patient is requesting re-fill on HYDROcodone-acetaminophen ([place]) 7.5-325 MG per tablet.  She states she is feeling better and is leaving for [facility] on Monday.

## 2014-09-26 NOTE — Telephone Encounter (Signed)
Pt notified Rx ready for pickup. Rx printed and signed.  

## 2014-11-15 ENCOUNTER — Other Ambulatory Visit: Payer: Self-pay | Admitting: Emergency Medicine

## 2014-11-15 ENCOUNTER — Other Ambulatory Visit: Payer: Self-pay | Admitting: Internal Medicine

## 2014-11-15 DIAGNOSIS — R0600 Dyspnea, unspecified: Secondary | ICD-10-CM

## 2014-11-16 ENCOUNTER — Ambulatory Visit (INDEPENDENT_AMBULATORY_CARE_PROVIDER_SITE_OTHER): Payer: 59 | Admitting: Emergency Medicine

## 2014-11-16 ENCOUNTER — Encounter: Payer: Self-pay | Admitting: Emergency Medicine

## 2014-11-16 VITALS — BP 110/62 | HR 77 | Ht 62.75 in | Wt 122.0 lb

## 2014-11-16 DIAGNOSIS — Z720 Tobacco use: Secondary | ICD-10-CM | POA: Diagnosis not present

## 2014-11-16 DIAGNOSIS — R911 Solitary pulmonary nodule: Secondary | ICD-10-CM | POA: Diagnosis not present

## 2014-11-16 DIAGNOSIS — J449 Chronic obstructive pulmonary disease, unspecified: Secondary | ICD-10-CM | POA: Diagnosis not present

## 2014-11-16 DIAGNOSIS — F17200 Nicotine dependence, unspecified, uncomplicated: Secondary | ICD-10-CM

## 2014-11-16 DIAGNOSIS — R0600 Dyspnea, unspecified: Secondary | ICD-10-CM | POA: Diagnosis not present

## 2014-11-16 LAB — PULMONARY FUNCTION TEST
DL/VA % pred: 57 %
DL/VA: 2.68 ml/min/mmHg/L
DLCO UNC % PRED: 51 %
DLCO unc: 11.63 ml/min/mmHg
FEF 25-75 Post: 0.48 L/sec
FEF 25-75 Pre: 0.42 L/sec
FEF2575-%Change-Post: 15 %
FEF2575-%Pred-Post: 21 %
FEF2575-%Pred-Pre: 18 %
FEV1-%Change-Post: 5 %
FEV1-%Pred-Post: 43 %
FEV1-%Pred-Pre: 41 %
FEV1-POST: 1.05 L
FEV1-Pre: 1 L
FEV1FVC-%CHANGE-POST: -4 %
FEV1FVC-%Pred-Pre: 63 %
FEV6-%CHANGE-POST: 8 %
FEV6-%PRED-POST: 69 %
FEV6-%PRED-PRE: 64 %
FEV6-POST: 2.11 L
FEV6-Pre: 1.94 L
FEV6FVC-%Change-Post: -1 %
FEV6FVC-%Pred-Post: 99 %
FEV6FVC-%Pred-Pre: 101 %
FVC-%CHANGE-POST: 10 %
FVC-%Pred-Post: 70 %
FVC-%Pred-Pre: 63 %
FVC-POST: 2.21 L
FVC-PRE: 2 L
PRE FEV1/FVC RATIO: 50 %
Post FEV1/FVC ratio: 48 %
Post FEV6/FVC ratio: 95 %
Pre FEV6/FVC Ratio: 97 %
RV % pred: 140 %
RV: 2.72 L
TLC % pred: 103 %
TLC: 5.06 L

## 2014-11-16 NOTE — Telephone Encounter (Signed)
Ok to refill 

## 2014-11-16 NOTE — Assessment & Plan Note (Signed)
4 mm nodule in a heavy smoker. We will perform a follow-up CT now to compare with one year ago. If we establish stability over 2 years that I would like to talk to her about taking part in the lung cancer screening program

## 2014-11-16 NOTE — Assessment & Plan Note (Signed)
She was able to quit. I congratulated her on this, we discussed strategies to maintain

## 2014-11-16 NOTE — Assessment & Plan Note (Signed)
Her pulmonary function testing shows that her FEV1 is dropped from 1.4 L to 1.0 L over the last 5 years. All the same clinically she is improved and I ascribe this to bronchodilator use and also to her smoking cessation.. We will continue her same medications.

## 2014-11-16 NOTE — Progress Notes (Signed)
PFT done today. 

## 2014-11-16 NOTE — Patient Instructions (Addendum)
CONGRATULATIONS on stopping smoking!! Start using mucinex-allergy every day to see if this helps with your cough and congestion Continue your Spiriva and Symbicort Use albuterol as needed.  We will repeat your Ct scan of the chest to compare with 1 year ago.  Follow with Dr Lamonte Sakai in 6 months or sooner if you have any problems

## 2014-11-16 NOTE — Progress Notes (Signed)
Subjective:    Patient ID: Kim [name], female    DOB: [birth year], 59 y.o.   MRN: [id number]  HPI 59 yo smoker (13 pk-yrs), hx of COPD formerly seen by Dr Elsworth Soho after a PNA in 2011.  Her spiro in 2011 > FEV1 1.46L (59%), FVC 2.68L (84%), ratio 54%. She has had wt loss 26 lbs over 6 months, increasing dyspnea. She just started chantix and has cut her cig down to 2 per day over the last several days. She has lost the ability to exert. Minimal cough. She does sometimes hear wheeze at night. No other flares except for the one for which she was hospitalized in 2011. She has used albuterol prn > just started  ROV 11/16/14 -- follow up visit for COPD, tobacco use, chronic dyspnea. She also has a history of a 4 mm left lower lobe pulmonary nodule on CT scan of the chest from 11/21/13. I last saw her about a year ago. At that time she was having progressive dyspnea. Returns feeling a bit better. Better exertional tolerance, minimal SABA use. Has gained back about 12 lbs. She has stopped smoking!! She is on spiriva and Symbicort, tolerates well. Last flare was this year, required abx and pred. She had PFT today that I have personally reviewed. FEV1 is 1L (41% pred), volumes hyperinflated, decreased DLCO.  She has daily cough, white mucous. Uses mucinex.    CAT Score 11/16/2014  Total CAT Score 27     Review of Systems  Constitutional: Negative for fever and unexpected weight change.  HENT: Negative for congestion, dental problem, ear pain, nosebleeds, postnasal drip, rhinorrhea, sinus pressure, sneezing, sore throat and trouble swallowing.   Eyes: Negative for redness and itching.  Respiratory: Positive for cough and shortness of breath. Negative for chest tightness and wheezing.   Cardiovascular: Negative for palpitations and leg swelling.  Gastrointestinal: Negative for nausea and vomiting.  Genitourinary: Negative for dysuria.  Musculoskeletal: Negative for joint swelling.  Skin: Negative for  rash.  Neurological: Negative for headaches.  Hematological: Does not bruise/bleed easily.  Psychiatric/Behavioral: Negative for dysphoric mood. The patient is not nervous/anxious.    Past Medical History  Diagnosis Date  . C8 RADICULOPATHY 06/05/2009  . COPD 08/31/2007  . DEPRESSION 11/19/2006  . DYSLIPIDEMIA 03/12/2009  . 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 OSTEOARTHRITIS 06/05/2009  . PARESTHESIA 08/24/2007  . TOBACCO ABUSE 01/25/2010     Family History  Problem Relation Age of Onset  . Cancer Mother     lung  . Heart attack Mother   . Hypertension Sister   . Multiple sclerosis Brother   . Multiple sclerosis Other   . Alcohol abuse Other   . Arthritis Other   . Diabetes Other   . Kidney disease Other   . Cancer Other     lung,ovarian,skin, uterine  . Stroke Other   . Heart disease Other   . Melanoma Other   . Diabetes Sister   . Rheum arthritis Sister   . Thyroid disease Sister   . Colon cancer Neg Hx   . Heart attack Maternal Uncle   . Heart attack Other     NEICE  . Heart attack Other     NEPHEW  . Hypertension Brother   . Stroke Maternal Grandmother      Social History   Social History  . Marital Status: Married    Spouse Name: N/A  . Number of Children: 2  . Years of Education: N/A   Occupational  History  . Patient care aide-sits with elderly lady    Social History Main Topics  . Smoking status: Former Smoker -- 1.50 packs/day for 30 years    Types: Cigarettes    Quit date: 12/21/2013  . Smokeless tobacco: Never Used  . Alcohol Use: No  . Drug Use: No  . Sexual Activity: Not on file   Other Topics Concern  . Not on file   Social History Narrative     Allergies  Allergen Reactions  . Penicillins Hives     Outpatient Prescriptions Prior to Visit  Medication Sig Dispense Refill  . albuterol (PROVENTIL HFA;VENTOLIN HFA) 108 (90 BASE) MCG/ACT inhaler Inhale 2 puffs into the lungs every 4 (four) hours as needed for wheezing or shortness of breath.      . ALPRAZolam (XANAX) 0.5 MG tablet take 1/2 to 1 tablet by mouth three times a day if needed 90 tablet 2  . budesonide-formoterol (SYMBICORT) 160-4.5 MCG/ACT inhaler Inhale 2 puffs into the lungs 2 (two) times daily. 1 Inhaler 5  . Cholecalciferol (VITAMIN D3) 1000 UNITS CAPS Take 1 capsule by mouth 2 (two) times daily.   0  . Dextromethorphan-Guaifenesin (MUCINEX DM MAXIMUM STRENGTH) 60-1200 MG TB12 Take 1,200 mg by mouth 2 (two) times daily.    Marland Kitchen HYDROcodone-acetaminophen (NORCO) 7.5-325 MG per tablet Take 1 tablet by mouth every 6 (six) hours as needed for moderate pain. 90 tablet 0  . nystatin (MYCOSTATIN) 100000 UNIT/ML suspension Take 5 mLs (500,000 Units total) by mouth 4 (four) times daily. 60 mL 0  . PARoxetine (PAXIL CR) 25 MG 24 hr tablet Take 1 tablet (25 mg total) by mouth every morning. 90 tablet 1  . pravastatin (PRAVACHOL) 20 MG tablet Take 1 tablet (20 mg total) by mouth every evening. 90 tablet 3  . SPIRIVA HANDIHALER 18 MCG inhalation capsule Inhale contents of 1 capsule daily  1  . azithromycin (ZITHROMAX) 250 MG tablet 2 tablets once daily for 3 consecutive days (Patient not taking: Reported on 11/16/2014) 6 tablet 0  . predniSONE (DELTASONE) 20 MG tablet One twice daily for 5 days, then one daily for 5 days,then 1 half daily for 5 days,then discontinue (Patient not taking: Reported on 11/16/2014) 18 tablet 0   No facility-administered medications prior to visit.         Objective:   Physical Exam Filed Vitals:   11/16/14 1359  BP: 110/62  Pulse: 77  Height: 5' 2.75" (1.594 m)  Weight: 122 lb (55.339 kg)  SpO2: 99%   Gen: Pleasant, thin woman, in no distress,  normal affect  ENT: No lesions,  mouth clear,  oropharynx clear, no postnasal drip  Neck: No JVD, no TMG, no carotid bruits  Lungs: No use of accessory muscles, clear on a normal breath, B soft wheeze on a forced expiration.  Cardiovascular: RRR, heart sounds normal, no murmur or gallops, no peripheral  edema  Musculoskeletal: No deformities, no cyanosis or clubbing  Neuro: alert, non focal  Skin: Warm, no lesions or rashes  CT chest 11/21/13 --  COMPARISON: Chest x-ray 12/29/2012  FINDINGS:  Mild to moderate centrilobular and paraseptal emphysema. Lungs are  clear. No effusions or nodules.  Heart is normal size. Aorta is normal caliber. Scattered coronary  artery calcifications in the left anterior descending coronary  artery. No mediastinal, hilar, or axillary adenopathy. Chest wall  soft tissues are unremarkable. Imaging into the upper abdomen shows  no acute findings.  IMPRESSION:  Mild to moderate COPD/emphysema.  4 mm left lower lobe pulmonary nodule. Given risk factors for  bronchogenic carcinoma, follow-up chest CT at 1 year is recommended.       Assessment & Plan:  Solitary pulmonary nodule 4 mm nodule in a heavy smoker. We will perform a follow-up CT now to compare with one year ago. If we establish stability over 2 years that I would like to talk to her about taking part in the lung cancer screening program  TOBACCO ABUSE She was able to quit. I congratulated her on this, we discussed strategies to maintain  COPD (chronic obstructive pulmonary disease) Her pulmonary function testing shows that her FEV1 is dropped from 1.4 L to 1.0 L over the last 5 years. All the same clinically she is improved and I ascribe this to bronchodilator use and also to her smoking cessation.. We will continue her same medications.

## 2014-11-20 NOTE — Telephone Encounter (Signed)
ok 

## 2014-11-22 ENCOUNTER — Ambulatory Visit (INDEPENDENT_AMBULATORY_CARE_PROVIDER_SITE_OTHER)
Admission: RE | Admit: 2014-11-22 | Discharge: 2014-11-22 | Disposition: A | Discharge status: Home / Self Care | Payer: 59 | Source: Ambulatory Visit | Attending: Emergency Medicine | Admitting: Emergency Medicine

## 2014-11-22 DIAGNOSIS — R911 Solitary pulmonary nodule: Secondary | ICD-10-CM | POA: Diagnosis not present

## 2014-11-24 ENCOUNTER — Telehealth: Payer: Self-pay | Admitting: Emergency Medicine

## 2014-11-24 NOTE — Telephone Encounter (Signed)
Please let her know that her CT scan is stable. There has been no change in her small pulmonary nodule - this is good news. She will need a repeat scan in 1 year to insure that it remains stable.

## 2014-11-24 NOTE — Telephone Encounter (Signed)
Pt is aware of CT results. Nothing further was needed.

## 2014-11-24 NOTE — Telephone Encounter (Signed)
Patient calling for CT results. CT results are in epic, awaiting RB to review Patient notified that we will call her when RB has reviewed.  To RB

## 2014-12-01 ENCOUNTER — Telehealth: Payer: Self-pay | Admitting: Internal Medicine

## 2014-12-01 ENCOUNTER — Other Ambulatory Visit: Payer: Self-pay | Admitting: Internal Medicine

## 2014-12-01 MED ORDER — PAROXETINE HCL ER 25 MG PO TB24: 25.0000 mg | ORAL_TABLET | Freq: Every morning | ORAL | Status: DC

## 2014-12-01 NOTE — Telephone Encounter (Signed)
Spoke to pt, told her will send Rx to [facility] for 30 tablets and do you still need a Rx sent to [facility]? Pt said yes. Told her okay will send. Pt verbalized understanding.

## 2014-12-01 NOTE — Telephone Encounter (Signed)
Pt request refill of the following: HYDROcodone-acetaminophen (NORCO) 7.5-325 MG per tablet ° ° °Phamacy: ° °

## 2014-12-01 NOTE — Telephone Encounter (Signed)
Pt request refill of the following: PARoxetine (PAXIL CR) 25 MG 24 hr tablet  Pt said [facility] will not be able to get her med to her until 12/14/14 and is asking if a few pills can be called for her to below pharmacy.     Phamacy: [place]

## 2014-12-04 MED ORDER — HYDROCODONE-ACETAMINOPHEN 7.5-325 MG PO TABS: 1.0000 | ORAL_TABLET | Freq: Four times a day (QID) | ORAL | Status: DC | PRN

## 2014-12-04 NOTE — Telephone Encounter (Signed)
Pt notified Rx ready for pickup. Rx printed and signed.  

## 2014-12-20 ENCOUNTER — Encounter: Payer: Self-pay | Admitting: Internal Medicine

## 2014-12-20 ENCOUNTER — Ambulatory Visit (INDEPENDENT_AMBULATORY_CARE_PROVIDER_SITE_OTHER): Payer: 59 | Admitting: Internal Medicine

## 2014-12-20 VITALS — BP 122/70 | HR 78 | Temp 98.5°F | Resp 20 | Ht 62.75 in | Wt 119.0 lb

## 2014-12-20 DIAGNOSIS — R109 Unspecified abdominal pain: Secondary | ICD-10-CM

## 2014-12-20 DIAGNOSIS — Z23 Encounter for immunization: Secondary | ICD-10-CM

## 2014-12-20 DIAGNOSIS — J410 Simple chronic bronchitis: Secondary | ICD-10-CM | POA: Diagnosis not present

## 2014-12-20 LAB — POCT URINALYSIS DIPSTICK
BILIRUBIN UA: NEGATIVE
Glucose, UA: NEGATIVE
Ketones, UA: NEGATIVE
LEUKOCYTES UA: NEGATIVE
NITRITE UA: NEGATIVE
PH UA: 5.5
PROTEIN UA: NEGATIVE
RBC UA: NEGATIVE
Spec Grav, UA: 1.02
UROBILINOGEN UA: 0.2

## 2014-12-20 NOTE — Progress Notes (Signed)
Subjective:    Patient ID: Kim [name], female    DOB: 22-Dec-1955, 59 y.o.   MRN: [id number]  HPI 59 year old patient who has COPD.  She parents with a few day history of left flank discomfort.  Pain is aggravated by bending, stooping and twisting at the waist.  No history of trauma or unusual activities.  She was concerned about a possible kidney infection.  No dysuria or fever. Her COPD has been stable.  She remains off tobacco products  Past Medical History  Diagnosis Date  . C8 RADICULOPATHY 06/05/2009  . COPD 08/31/2007  . DEPRESSION 11/19/2006  . DYSLIPIDEMIA 03/12/2009  . 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 OSTEOARTHRITIS 06/05/2009  . PARESTHESIA 08/24/2007  . TOBACCO ABUSE 01/25/2010    Social History   Social History  . Marital Status: Married    Spouse Name: N/A  . Number of Children: 2  . Years of Education: N/A   Occupational History  . Patient care aide-sits with elderly lady    Social History Main Topics  . Smoking status: Former Smoker -- 1.50 packs/day for 30 years    Types: Cigarettes    Quit date: 12/21/2013  . Smokeless tobacco: Never Used  . Alcohol Use: No  . Drug Use: No  . Sexual Activity: Not on file   Other Topics Concern  . Not on file   Social History Narrative    Past Surgical History  Procedure Laterality Date  . Appendectomy  1969  . Cholecystectomy  1989  . Exploratory laparotomy  1969  . Collapse lung  1990    Family History  Problem Relation Age of Onset  . Cancer Mother     lung  . Heart attack Mother   . Hypertension Sister   . Multiple sclerosis Brother   . Multiple sclerosis Other   . Alcohol abuse Other   . Arthritis Other   . Diabetes Other   . Kidney disease Other   . Cancer Other     lung,ovarian,skin, uterine  . Stroke Other   . Heart disease Other   . Melanoma Other   . Diabetes Sister   . Rheum arthritis Sister   . Thyroid disease Sister   . Colon cancer Neg Hx   . Heart attack Maternal Uncle   . Heart  attack Other     NEICE  . Heart attack Other     NEPHEW  . Hypertension Brother   . Stroke Maternal Grandmother     Allergies  Allergen Reactions  . Penicillins Hives    Current Outpatient Prescriptions on File Prior to Visit  Medication Sig Dispense Refill  . albuterol (PROVENTIL HFA;VENTOLIN HFA) 108 (90 BASE) MCG/ACT inhaler Inhale 2 puffs into the lungs every 4 (four) hours as needed for wheezing or shortness of breath.    . ALPRAZolam (XANAX) 0.5 MG tablet TAKE 1/2-1 TABLET BY MOUTH 3 TIMES DAILY AS NEEDED 90 tablet 2  . budesonide-formoterol (SYMBICORT) 160-4.5 MCG/ACT inhaler Inhale 2 puffs into the lungs 2 (two) times daily. 1 Inhaler 5  . Cholecalciferol (VITAMIN D3) 1000 UNITS CAPS Take 1 capsule by mouth 2 (two) times daily.   0  . Dextromethorphan-Guaifenesin (MUCINEX DM MAXIMUM STRENGTH) 60-1200 MG TB12 Take 1,200 mg by mouth 2 (two) times daily.    Marland Kitchen HYDROcodone-acetaminophen (NORCO) 7.5-325 MG per tablet Take 1 tablet by mouth every 6 (six) hours as needed for moderate pain. 90 tablet 0  . nystatin (MYCOSTATIN) 100000 UNIT/ML suspension Take 5 mLs (500,000  Units total) by mouth 4 (four) times daily. 60 mL 0  . PARoxetine (PAXIL CR) 25 MG 24 hr tablet Take 1 tablet (25 mg total) by mouth every morning. 90 tablet 1  . pravastatin (PRAVACHOL) 20 MG tablet Take 1 tablet (20 mg total) by mouth every evening. 90 tablet 3  . SPIRIVA HANDIHALER 18 MCG inhalation capsule Inhale contents of 1 capsule daily  1   No current facility-administered medications on file prior to visit.    BP 122/70 mmHg  Pulse 78  Temp(Src) 98.5 F (36.9 C) (Oral)  Resp 20  Ht 5' 2.75" (1.594 m)  Wt 119 lb (53.978 kg)  BMI 21.24 kg/m2  SpO2 95%       Review of Systems  Constitutional: Negative.   HENT: Negative for congestion, dental problem, hearing loss, rhinorrhea, sinus pressure, sore throat and tinnitus.   Eyes: Negative for pain, discharge and visual disturbance.  Respiratory:  Negative for cough and shortness of breath.   Cardiovascular: Negative for chest pain, palpitations and leg swelling.  Gastrointestinal: Negative for nausea, vomiting, abdominal pain, diarrhea, constipation, blood in stool and abdominal distention.  Genitourinary: Positive for flank pain. Negative for dysuria, urgency, frequency, hematuria, vaginal bleeding, vaginal discharge, difficulty urinating, vaginal pain and pelvic pain.  Musculoskeletal: Negative for joint swelling, arthralgias and gait problem.  Skin: Negative for rash.  Neurological: Negative for dizziness, syncope, speech difficulty, weakness, numbness and headaches.  Hematological: Negative for adenopathy.  Psychiatric/Behavioral: Negative for behavioral problems, dysphoric mood and agitation. The patient is not nervous/anxious.        Objective:   Physical Exam  Constitutional: She is oriented to person, place, and time. She appears well-developed and well-nourished.  HENT:  Head: Normocephalic.  Right Ear: External ear normal.  Left Ear: External ear normal.  Mouth/Throat: Oropharynx is clear and moist.  Eyes: Conjunctivae and EOM are normal. Pupils are equal, round, and reactive to light.  Neck: Normal range of motion. Neck supple. No thyromegaly present.  Cardiovascular: Normal rate, regular rhythm, normal heart sounds and intact distal pulses.   Pulmonary/Chest: Effort normal and breath sounds normal.  Abdominal: Soft. Bowel sounds are normal. She exhibits no mass. There is no tenderness.  Mild tenderness to palpation in the left flank area.  No splenomegaly.  Musculoskeletal: Normal range of motion.  Lymphadenopathy:    She has no cervical adenopathy.  Neurological: She is alert and oriented to person, place, and time.  Skin: Skin is warm and dry. No rash noted.  Psychiatric: She has a normal mood and affect. Her behavior is normal.          Assessment & Plan:  Left flank pain.  Suspect musculoligamentous.   Will observe.  The patient does have analgesics if needed COPD stable.  CPX as scheduled in 2 months

## 2014-12-20 NOTE — Patient Instructions (Signed)
Call or return to clinic prn if these symptoms worsen or fail to improve as anticipated.

## 2014-12-20 NOTE — Progress Notes (Signed)
Pre visit review using our clinic review tool, if applicable. No additional management support is needed unless otherwise documented below in the visit note. 

## 2015-01-30 ENCOUNTER — Other Ambulatory Visit (INDEPENDENT_AMBULATORY_CARE_PROVIDER_SITE_OTHER): Payer: 59

## 2015-01-30 DIAGNOSIS — Z0000 Encounter for general adult medical examination without abnormal findings: Secondary | ICD-10-CM

## 2015-01-30 LAB — POCT URINALYSIS DIPSTICK
Bilirubin, UA: NEGATIVE
Glucose, UA: NEGATIVE
KETONES UA: NEGATIVE
Nitrite, UA: NEGATIVE
PROTEIN UA: NEGATIVE
Spec Grav, UA: >=1.03
Urobilinogen, UA: 0.2
pH, UA: 5.5

## 2015-01-30 LAB — LIPID PANEL
CHOL/HDL RATIO: 5
CHOLESTEROL: 207 mg/dL — AB (ref 0–200)
HDL: 41.2 mg/dL (ref >39.00)
LDL CALC: 143 mg/dL — AB (ref 0–99)
NONHDL: 166.09
Triglycerides: 114 mg/dL (ref 0.0–149.0)
VLDL: 22.8 mg/dL (ref 0.0–40.0)

## 2015-01-30 LAB — BASIC METABOLIC PANEL
BUN: 14 mg/dL (ref 6–23)
CHLORIDE: 106 meq/L (ref 96–112)
CO2: 31 meq/L (ref 19–32)
CREATININE: 0.76 mg/dL (ref 0.40–1.20)
Calcium: 9.5 mg/dL (ref 8.4–10.5)
GFR: 82.23 mL/min (ref >60.00)
Glucose, Bld: 96 mg/dL (ref 70–99)
POTASSIUM: 3.7 meq/L (ref 3.5–5.1)
Sodium: 143 mEq/L (ref 135–145)

## 2015-01-30 LAB — CBC WITH DIFFERENTIAL/PLATELET
BASOS ABS: 0.1 10*3/uL (ref 0.0–0.1)
Basophils Relative: 0.7 % (ref 0.0–3.0)
EOS ABS: 0.2 10*3/uL (ref 0.0–0.7)
Eosinophils Relative: 2 % (ref 0.0–5.0)
HCT: 42.9 % (ref 36.0–46.0)
HEMOGLOBIN: 14.1 g/dL (ref 12.0–15.0)
LYMPHS ABS: 4.1 10*3/uL — AB (ref 0.7–4.0)
Lymphocytes Relative: 42.3 % (ref 12.0–46.0)
MCHC: 32.8 g/dL (ref 30.0–36.0)
MCV: 92.9 fl (ref 78.0–100.0)
MONO ABS: 0.7 10*3/uL (ref 0.1–1.0)
Monocytes Relative: 6.9 % (ref 3.0–12.0)
NEUTROS PCT: 48.1 % (ref 43.0–77.0)
Neutro Abs: 4.7 10*3/uL (ref 1.4–7.7)
Platelets: 263 10*3/uL (ref 150.0–400.0)
RBC: 4.62 Mil/uL (ref 3.87–5.11)
RDW: 14.3 % (ref 11.5–15.5)
WBC: 9.8 10*3/uL (ref 4.0–10.5)

## 2015-01-30 LAB — HEPATIC FUNCTION PANEL
ALK PHOS: 71 U/L (ref 39–117)
ALT: 8 U/L (ref 0–35)
AST: 11 U/L (ref 0–37)
Albumin: 4.1 g/dL (ref 3.5–5.2)
BILIRUBIN DIRECT: 0.1 mg/dL (ref 0.0–0.3)
BILIRUBIN TOTAL: 0.3 mg/dL (ref 0.2–1.2)
Total Protein: 6.6 g/dL (ref 6.0–8.3)

## 2015-01-30 LAB — TSH: TSH: 1.24 u[IU]/mL (ref 0.35–4.50)

## 2015-02-07 ENCOUNTER — Ambulatory Visit (INDEPENDENT_AMBULATORY_CARE_PROVIDER_SITE_OTHER): Payer: 59 | Admitting: Internal Medicine

## 2015-02-07 ENCOUNTER — Encounter: Payer: Self-pay | Admitting: Internal Medicine

## 2015-02-07 VITALS — BP 132/70 | HR 66 | Temp 98.4°F | Resp 18 | Ht 62.75 in | Wt 118.0 lb

## 2015-02-07 DIAGNOSIS — Z0000 Encounter for general adult medical examination without abnormal findings: Secondary | ICD-10-CM

## 2015-02-07 DIAGNOSIS — F17200 Nicotine dependence, unspecified, uncomplicated: Secondary | ICD-10-CM | POA: Diagnosis not present

## 2015-02-07 DIAGNOSIS — M150 Primary generalized (osteo)arthritis: Secondary | ICD-10-CM | POA: Diagnosis not present

## 2015-02-07 DIAGNOSIS — E785 Hyperlipidemia, unspecified: Secondary | ICD-10-CM

## 2015-02-07 DIAGNOSIS — M159 Polyosteoarthritis, unspecified: Secondary | ICD-10-CM

## 2015-02-07 MED ORDER — HYDROCODONE-ACETAMINOPHEN 7.5-325 MG PO TABS: 1.0000 | ORAL_TABLET | Freq: Four times a day (QID) | ORAL | Status: DC | PRN

## 2015-02-07 NOTE — Progress Notes (Signed)
Subjective:    Patient ID: Kim [name], female    DOB: Dec 28, 1955, 59 y.o.   MRN: [id number]  HPI  59 year old patient who is seen today for a preventive health examination She is followed bipolar medicine due to COPD.  Remains off tobacco products. She has history weight loss, which has stabilized.  Wt Readings from Last 3 Encounters:  02/07/15 118 lb (53.524 kg)  12/20/14 119 lb (53.978 kg)  11/16/14 122 lb (55.339 kg)     she is followed for a small pulmonary nodule and will be consider for a follow-up chest CT next year.   .  She has dyslipidemia and remains on statin therapy.  Past Medical History  Diagnosis Date  . C8 RADICULOPATHY 06/05/2009  . COPD 08/31/2007  . DEPRESSION 11/19/2006  . DYSLIPIDEMIA 03/12/2009  . 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 OSTEOARTHRITIS 06/05/2009  . PARESTHESIA 08/24/2007  . TOBACCO ABUSE 01/25/2010    Social History   Social History  . Marital Status: Married    Spouse Name: N/A  . Number of Children: 2  . Years of Education: N/A   Occupational History  . Patient care aide-sits with elderly lady    Social History Main Topics  . Smoking status: Former Smoker -- 1.50 packs/day for 30 years    Types: Cigarettes    Quit date: 12/21/2013  . Smokeless tobacco: Never Used  . Alcohol Use: No  . Drug Use: No  . Sexual Activity: Not on file   Other Topics Concern  . Not on file   Social History Narrative    Past Surgical History  Procedure Laterality Date  . Appendectomy  1969  . Cholecystectomy  1989  . Exploratory laparotomy  1969  . Collapse lung  1990    Family History  Problem Relation Age of Onset  . Cancer Mother     lung  . Heart attack Mother   . Hypertension Sister   . Multiple sclerosis Brother   . Multiple sclerosis Other   . Alcohol abuse Other   . Arthritis Other   . Diabetes Other   . Kidney disease Other   . Cancer Other     lung,ovarian,skin, uterine  . Stroke Other   . Heart disease Other   .  Melanoma Other   . Diabetes Sister   . Rheum arthritis Sister   . Thyroid disease Sister   . Colon cancer Neg Hx   . Heart attack Maternal Uncle   . Heart attack Other     NEICE  . Heart attack Other     NEPHEW  . Hypertension Brother   . Stroke Maternal Grandmother     Allergies  Allergen Reactions  . Penicillins Hives    Current Outpatient Prescriptions on File Prior to Visit  Medication Sig Dispense Refill  . albuterol (PROVENTIL HFA;VENTOLIN HFA) 108 (90 BASE) MCG/ACT inhaler Inhale 2 puffs into the lungs every 4 (four) hours as needed for wheezing or shortness of breath.    . ALPRAZolam (XANAX) 0.5 MG tablet TAKE 1/2-1 TABLET BY MOUTH 3 TIMES DAILY AS NEEDED 90 tablet 2  . budesonide-formoterol (SYMBICORT) 160-4.5 MCG/ACT inhaler Inhale 2 puffs into the lungs 2 (two) times daily. 1 Inhaler 5  . Cholecalciferol (VITAMIN D3) 1000 UNITS CAPS Take 1 capsule by mouth 2 (two) times daily.   0  . Dextromethorphan-Guaifenesin (MUCINEX DM MAXIMUM STRENGTH) 60-1200 MG TB12 Take 1,200 mg by mouth 2 (two) times daily.    Marland Kitchen nystatin (MYCOSTATIN) 100000  UNIT/ML suspension Take 5 mLs (500,000 Units total) by mouth 4 (four) times daily. 60 mL 0  . PARoxetine (PAXIL CR) 25 MG 24 hr tablet Take 1 tablet (25 mg total) by mouth every morning. 90 tablet 1  . pravastatin (PRAVACHOL) 20 MG tablet Take 1 tablet (20 mg total) by mouth every evening. 90 tablet 3  . SPIRIVA HANDIHALER 18 MCG inhalation capsule Inhale contents of 1 capsule daily  1   No current facility-administered medications on file prior to visit.    BP 132/70 mmHg  Pulse 66  Temp(Src) 98.4 F (36.9 C) (Oral)  Resp 18  Ht 5' 2.75" (1.594 m)  Wt 118 lb (53.524 kg)  BMI 21.07 kg/m2  SpO2 97%     Review of Systems  Constitutional: Negative for fever, appetite change, fatigue and unexpected weight change.  HENT: Negative for congestion, dental problem, ear pain, hearing loss, mouth sores, nosebleeds, sinus pressure, sore  throat, tinnitus, trouble swallowing and voice change.   Eyes: Negative for photophobia, pain, redness and visual disturbance.  Respiratory: Positive for shortness of breath. Negative for cough and chest tightness.   Cardiovascular: Negative for chest pain, palpitations and leg swelling.  Gastrointestinal: Negative for nausea, vomiting, abdominal pain, diarrhea, constipation, blood in stool, abdominal distention and rectal pain.  Genitourinary: Negative for dysuria, urgency, frequency, hematuria, flank pain, vaginal bleeding, vaginal discharge, difficulty urinating, genital sores, vaginal pain, menstrual problem and pelvic pain.  Musculoskeletal: Negative for back pain, arthralgias and neck stiffness.  Skin: Negative for rash.  Neurological: Negative for dizziness, syncope, speech difficulty, weakness, light-headedness, numbness and headaches.  Hematological: Negative for adenopathy. Does not bruise/bleed easily.  Psychiatric/Behavioral: Negative for suicidal ideas, behavioral problems, self-injury, dysphoric mood and agitation. The patient is not nervous/anxious.        Objective:   Physical Exam  Constitutional: She is oriented to person, place, and time. She appears well-developed and well-nourished.  HENT:  Head: Normocephalic and atraumatic.  Right Ear: External ear normal.  Left Ear: External ear normal.  Mouth/Throat: Oropharynx is clear and moist.  Eyes: Conjunctivae and EOM are normal.  Neck: Normal range of motion. Neck supple. No JVD present. No thyromegaly present.  Cardiovascular: Normal rate, regular rhythm, normal heart sounds and intact distal pulses.   No murmur heard. Pulmonary/Chest: Effort normal and breath sounds normal. She has no wheezes. She has no rales.  Abdominal: Soft. Bowel sounds are normal. She exhibits no distension and no mass. There is no tenderness. There is no rebound and no guarding.  Musculoskeletal: Normal range of motion. She exhibits no edema or  tenderness.  Neurological: She is alert and oriented to person, place, and time. She has normal reflexes. No cranial nerve deficit. She exhibits normal muscle tone. Coordination normal.  Skin: Skin is warm and dry. No rash noted.  Psychiatric: She has a normal mood and affect. Her behavior is normal.          Assessment & Plan:  Preventive health examination COPD.  Continued abstinence from tobacco products.  Discussed and encouraged History pulmonary nodule.  Follow-up pulmonary medicine in one year History weight loss.  Stable  Laboratory studies reviewed Annual mammogram encouraged  Recheck one year or as needed

## 2015-02-07 NOTE — Patient Instructions (Signed)
Menopause is a normal process in which your reproductive ability comes to an end. This process happens gradually over a span of months to years, usually between the ages of 48 and 55. Menopause is complete when you have missed 12 consecutive menstrual periods. It is important to talk with your health care provider about some of the most common conditions that affect postmenopausal women, such as heart disease, cancer, and bone loss (osteoporosis). Adopting a healthy lifestyle and getting preventive care can help to promote your health and wellness. Those actions can also lower your chances of developing some of these common conditions. WHAT SHOULD I KNOW ABOUT MENOPAUSE? During menopause, you may experience a number of symptoms, such as:  Moderate-to-severe hot flashes.  Night sweats.  Decrease in sex drive.  Mood swings.  Headaches.  Tiredness.  Irritability.  Memory problems.  Insomnia. Choosing to treat or not to treat menopausal changes is an individual decision that you make with your health care provider. WHAT SHOULD I KNOW ABOUT HORMONE REPLACEMENT THERAPY AND SUPPLEMENTS? Hormone therapy products are effective for treating symptoms that are associated with menopause, such as hot flashes and night sweats. Hormone replacement carries certain risks, especially as you become older. If you are thinking about using estrogen or estrogen with progestin treatments, discuss the benefits and risks with your health care provider. WHAT SHOULD I KNOW ABOUT HEART DISEASE AND STROKE? Heart disease, heart attack, and stroke become more likely as you age. This may be due, in part, to the hormonal changes that your body experiences during menopause. These can affect how your body processes dietary fats, triglycerides, and cholesterol. Heart attack and stroke are both medical emergencies. There are many things that you can do to help prevent heart disease and stroke:  Have your blood pressure  checked at least every 1-2 years. High blood pressure causes heart disease and increases the risk of stroke.  If you are 55-79 years old, ask your health care provider if you should take aspirin to prevent a heart attack or a stroke.  Do not use any tobacco products, including cigarettes, chewing tobacco, or electronic cigarettes. If you need help quitting, ask your health care provider.  It is important to eat a healthy diet and maintain a healthy weight.  Be sure to include plenty of vegetables, fruits, low-fat dairy products, and lean protein.  Avoid eating foods that are high in solid fats, added sugars, or salt (sodium).  Get regular exercise. This is one of the most important things that you can do for your health.  Try to exercise for at least 150 minutes each week. The type of exercise that you do should increase your heart rate and make you sweat. This is known as moderate-intensity exercise.  Try to do strengthening exercises at least twice each week. Do these in addition to the moderate-intensity exercise.  Know your numbers.Ask your health care provider to check your cholesterol and your blood glucose. Continue to have your blood tested as directed by your health care provider. WHAT SHOULD I KNOW ABOUT CANCER SCREENING? There are several types of cancer. Take the following steps to reduce your risk and to catch any cancer development as early as possible. Breast Cancer  Practice breast self-awareness.  This means understanding how your breasts normally appear and feel.  It also means doing regular breast self-exams. Let your health care provider know about any changes, no matter how small.  If you are 40 or older, have a clinician do a   breast exam (clinical breast exam or CBE) every year. Depending on your age, family history, and medical history, it may be recommended that you also have a yearly breast X-ray (mammogram).  If you have a family history of breast cancer,  talk with your health care provider about genetic screening.  If you are at high risk for breast cancer, talk with your health care provider about having an MRI and a mammogram every year.  Breast cancer (BRCA) gene test is recommended for women who have family members with BRCA-related cancers. Results of the assessment will determine the need for genetic counseling and BRCA1 and for BRCA2 testing. BRCA-related cancers include these types:  Breast. This occurs in males or females.  Ovarian.  Tubal. This may also be called fallopian tube cancer.  Cancer of the abdominal or pelvic lining (peritoneal cancer).  Prostate.  Pancreatic. Cervical, Uterine, and Ovarian Cancer Your health care provider may recommend that you be screened regularly for cancer of the pelvic organs. These include your ovaries, uterus, and vagina. This screening involves a pelvic exam, which includes checking for microscopic changes to the surface of your cervix (Pap test).  For women ages 21-65, health care providers may recommend a pelvic exam and a Pap test every three years. For women ages 77-65, they may recommend the Pap test and pelvic exam, combined with testing for human papilloma virus (HPV), every five years. Some types of HPV increase your risk of cervical cancer. Testing for HPV may also be done on women of any age who have unclear Pap test results.  Other health care providers may not recommend any screening for nonpregnant women who are considered low risk for pelvic cancer and have no symptoms. Ask your health care provider if a screening pelvic exam is right for you.  If you have had past treatment for cervical cancer or a condition that could lead to cancer, you need Pap tests and screening for cancer for at least 20 years after your treatment. If Pap tests have been discontinued for you, your risk factors (such as having a new sexual partner) need to be reassessed to determine if you should start having  screenings again. Some women have medical problems that increase the chance of getting cervical cancer. In these cases, your health care provider may recommend that you have screening and Pap tests more often.  If you have a family history of uterine cancer or ovarian cancer, talk with your health care provider about genetic screening.  If you have vaginal bleeding after reaching menopause, tell your health care provider.  There are currently no reliable tests available to screen for ovarian cancer. Lung Cancer Lung cancer screening is recommended for adults 3-70 years old who are at high risk for lung cancer because of a history of smoking. A yearly low-dose CT scan of the lungs is recommended if you:  Currently smoke.  Have a history of at least 30 pack-years of smoking and you currently smoke or have quit within the past 15 years. A pack-year is smoking an average of one pack of cigarettes per day for one year. Yearly screening should:  Continue until it has been 15 years since you quit.  Stop if you develop a health problem that would prevent you from having lung cancer treatment. Colorectal Cancer  This type of cancer can be detected and can often be prevented.  Routine colorectal cancer screening usually begins at age 38 and continues through age 12.  If you have  risk factors for colon cancer, your health care provider may recommend that you be screened at an earlier age.  If you have a family history of colorectal cancer, talk with your health care provider about genetic screening.  Your health care provider may also recommend using home test kits to check for hidden blood in your stool.  A small camera at the end of a tube can be used to examine your colon directly (sigmoidoscopy or colonoscopy). This is done to check for the earliest forms of colorectal cancer.  Direct examination of the colon should be repeated every 5-10 years until age 67. However, if early forms of  precancerous polyps or small growths are found or if you have a family history or genetic risk for colorectal cancer, you may need to be screened more often. Skin Cancer  Check your skin from head to toe regularly.  Monitor any moles. Be sure to tell your health care provider:  About any new moles or changes in moles, especially if there is a change in a mole's shape or color.  If you have a mole that is larger than the size of a pencil eraser.  If any of your family members has a history of skin cancer, especially at a young age, talk with your health care provider about genetic screening.  Always use sunscreen. Apply sunscreen liberally and repeatedly throughout the day.  Whenever you are outside, protect yourself by wearing long sleeves, pants, a wide-brimmed hat, and sunglasses. WHAT SHOULD I KNOW ABOUT OSTEOPOROSIS? Osteoporosis is a condition in which bone destruction happens more quickly than new bone creation. After menopause, you may be at an increased risk for osteoporosis. To help prevent osteoporosis or the bone fractures that can happen because of osteoporosis, the following is recommended:  If you are 39-61 years old, get at least 1,000 mg of calcium and at least 600 mg of vitamin D per day.  If you are older than age 16 but younger than age 7, get at least 1,200 mg of calcium and at least 600 mg of vitamin D per day.  If you are older than age 47, get at least 1,200 mg of calcium and at least 800 mg of vitamin D per day. Smoking and excessive alcohol intake increase the risk of osteoporosis. Eat foods that are rich in calcium and vitamin D, and do weight-bearing exercises several times each week as directed by your health care provider. WHAT SHOULD I KNOW ABOUT HOW MENOPAUSE AFFECTS Kim [name]? Depression may occur at any age, but it is more common as you become older. Common symptoms of depression include:  Low or sad mood.  Changes in sleep patterns.  Changes  in appetite or eating patterns.  Feeling an overall lack of motivation or enjoyment of activities that you previously enjoyed.  Frequent crying spells. Talk with your health care provider if you think that you are experiencing depression. WHAT SHOULD I KNOW ABOUT IMMUNIZATIONS? It is important that you get and maintain your immunizations. These include:  Tetanus, diphtheria, and pertussis (Tdap) booster vaccine.  Influenza every year before the flu season begins.  Pneumonia vaccine.  Shingles vaccine. Your health care provider may also recommend other immunizations.   This information is not intended to replace advice given to you by your health care provider. Make sure you discuss any questions you have with your health care provider.   Document Released: 05/16/2005 Document Revised: 04/14/2014 Document Reviewed: 11/24/2013 Elsevier Interactive Patient Education 2016 [employer]

## 2015-02-07 NOTE — Progress Notes (Signed)
Pre visit review using our clinic review tool, if applicable. No additional management support is needed unless otherwise documented below in the visit note. 

## 2015-02-08 ENCOUNTER — Encounter: Payer: 59 | Admitting: Internal Medicine

## 2015-03-16 ENCOUNTER — Other Ambulatory Visit: Payer: Self-pay | Admitting: Internal Medicine

## 2015-03-16 MED ORDER — HYDROCODONE-ACETAMINOPHEN 7.5-325 MG PO TABS: 1.0000 | ORAL_TABLET | Freq: Four times a day (QID) | ORAL | Status: DC | PRN

## 2015-03-16 NOTE — Telephone Encounter (Signed)
Ok per Dr K 

## 2015-04-10 ENCOUNTER — Other Ambulatory Visit: Payer: Self-pay | Admitting: Internal Medicine

## 2015-05-02 ENCOUNTER — Telehealth: Payer: Self-pay | Admitting: Internal Medicine

## 2015-05-02 NOTE — Telephone Encounter (Signed)
Patient would like her Hydrocodeine medication refilled.

## 2015-05-03 MED ORDER — HYDROCODONE-ACETAMINOPHEN 7.5-325 MG PO TABS: 1.0000 | ORAL_TABLET | Freq: Four times a day (QID) | ORAL | Status: DC | PRN

## 2015-05-03 NOTE — Telephone Encounter (Signed)
Pt notified Rx ready for pickup. Rx printed and signed.  

## 2015-05-04 ENCOUNTER — Other Ambulatory Visit: Payer: Self-pay | Admitting: Internal Medicine

## 2015-05-17 ENCOUNTER — Ambulatory Visit: Payer: 59 | Admitting: Emergency Medicine

## 2015-06-13 ENCOUNTER — Telehealth: Payer: Self-pay | Admitting: Internal Medicine

## 2015-06-13 NOTE — Telephone Encounter (Signed)
Pt has history of copd and has chest congestion and requesting to see dr Raliegh Ip on Friday. Can I open slot up. Pt decline to see another provider

## 2015-06-14 NOTE — Telephone Encounter (Signed)
Pt has been sch

## 2015-06-14 NOTE — Telephone Encounter (Signed)
Constance Holster, please schedule her at 2:15 tomorrow.

## 2015-06-15 ENCOUNTER — Ambulatory Visit (INDEPENDENT_AMBULATORY_CARE_PROVIDER_SITE_OTHER): Payer: 59 | Admitting: Internal Medicine

## 2015-06-15 ENCOUNTER — Encounter: Payer: Self-pay | Admitting: Internal Medicine

## 2015-06-15 VITALS — BP 150/80 | HR 65 | Temp 97.8°F | Resp 20 | Ht 62.75 in | Wt 122.0 lb

## 2015-06-15 DIAGNOSIS — J440 Chronic obstructive pulmonary disease with acute lower respiratory infection: Secondary | ICD-10-CM | POA: Diagnosis not present

## 2015-06-15 MED ORDER — PREDNISONE 10 MG PO TABS: ORAL_TABLET | ORAL | Status: DC

## 2015-06-15 MED ORDER — HYDROCODONE-ACETAMINOPHEN 7.5-325 MG PO TABS: 1.0000 | ORAL_TABLET | Freq: Four times a day (QID) | ORAL | Status: DC | PRN

## 2015-06-15 MED ORDER — AZITHROMYCIN 250 MG PO TABS: ORAL_TABLET | ORAL | Status: DC

## 2015-06-15 NOTE — Progress Notes (Signed)
Pre visit review using our clinic review tool, if applicable. No additional management support is needed unless otherwise documented below in the visit note. 

## 2015-06-15 NOTE — Progress Notes (Signed)
Subjective:    Patient ID: Kim [name], female    DOB: 11/08/1955, 60 y.o.   MRN: [id number]  HPI 60 year old patient has a history of COPD.  She has been ill for the past 8 days with increasing cough, chest congestion, wheezing.  Men's medications include Spiriva once daily and also Symbicort that she also takes just once daily.  She's had increase albuterol use.  Cough is mildly productive. Medical regimen does include hydrocodone for pain  Past Medical History  Diagnosis Date  . C8 RADICULOPATHY 06/05/2009  . COPD 08/31/2007  . DEPRESSION 11/19/2006  . DYSLIPIDEMIA 03/12/2009  . 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 OSTEOARTHRITIS 06/05/2009  . PARESTHESIA 08/24/2007  . TOBACCO ABUSE 01/25/2010    Social History   Social History  . Marital Status: Married    Spouse Name: N/A  . Number of Children: 2  . Years of Education: N/A   Occupational History  . Patient care aide-sits with elderly lady    Social History Main Topics  . Smoking status: Former Smoker -- 1.50 packs/day for 30 years    Types: Cigarettes    Quit date: 12/21/2013  . Smokeless tobacco: Never Used  . Alcohol Use: No  . Drug Use: No  . Sexual Activity: Not on file   Other Topics Concern  . Not on file   Social History Narrative    Past Surgical History  Procedure Laterality Date  . Appendectomy  1969  . Cholecystectomy  1989  . Exploratory laparotomy  1969  . Collapse lung  1990    Family History  Problem Relation Age of Onset  . Cancer Mother     lung  . Heart attack Mother   . Hypertension Sister   . Multiple sclerosis Brother   . Multiple sclerosis Other   . Alcohol abuse Other   . Arthritis Other   . Diabetes Other   . Kidney disease Other   . Cancer Other     lung,ovarian,skin, uterine  . Stroke Other   . Heart disease Other   . Melanoma Other   . Diabetes Sister   . Rheum arthritis Sister   . Thyroid disease Sister   . Colon cancer Neg Hx   . Heart attack Maternal Uncle   . Heart  attack Other     NEICE  . Heart attack Other     NEPHEW  . Hypertension Brother   . Stroke Maternal Grandmother     Allergies  Allergen Reactions  . Penicillins Hives    Current Outpatient Prescriptions on File Prior to Visit  Medication Sig Dispense Refill  . albuterol (PROVENTIL HFA;VENTOLIN HFA) 108 (90 BASE) MCG/ACT inhaler Inhale 2 puffs into the lungs every 4 (four) hours as needed for wheezing or shortness of breath.    . ALPRAZolam (XANAX) 0.5 MG tablet TAKE 1/2 TO 1 TABLET BY MOUTH 3 TIMES A DAY AS NEEDED 90 tablet 2  . budesonide-formoterol (SYMBICORT) 160-4.5 MCG/ACT inhaler Inhale 2 puffs into the lungs 2 (two) times daily. 1 Inhaler 5  . Cholecalciferol (VITAMIN D3) 1000 UNITS CAPS Take 1 capsule by mouth 2 (two) times daily.   0  . Dextromethorphan-Guaifenesin (MUCINEX DM MAXIMUM STRENGTH) 60-1200 MG TB12 Take 1,200 mg by mouth 2 (two) times daily.    Marland Kitchen HYDROcodone-acetaminophen (NORCO) 7.5-325 MG tablet Take 1 tablet by mouth every 6 (six) hours as needed for moderate pain. 90 tablet 0  . nystatin (MYCOSTATIN) 100000 UNIT/ML suspension Take 5 mLs (500,000 Units total)  by mouth 4 (four) times daily. 60 mL 0  . PAXIL CR 25 MG 24 hr tablet Take 1 tablet by mouth  every morning 90 tablet 1  . pravastatin (PRAVACHOL) 20 MG tablet Take 1 tablet (20 mg total) by mouth every evening. 90 tablet 3  . SPIRIVA HANDIHALER 18 MCG inhalation capsule Inhale contents of 1 capsule daily  1   No current facility-administered medications on file prior to visit.    BP 150/80 mmHg  Pulse 65  Temp(Src) 97.8 F (36.6 C) (Oral)  Resp 20  Ht 5' 2.75" (1.594 m)  Wt 122 lb (55.339 kg)  BMI 21.78 kg/m2  SpO2 97%      Review of Systems  Constitutional: Positive for activity change, appetite change and fatigue.  HENT: Negative for congestion, dental problem, hearing loss, rhinorrhea, sinus pressure, sore throat and tinnitus.   Eyes: Negative for pain, discharge and visual disturbance.    Respiratory: Positive for cough, shortness of breath and wheezing.   Cardiovascular: Negative for chest pain, palpitations and leg swelling.  Gastrointestinal: Negative for nausea, vomiting, abdominal pain, diarrhea, constipation, blood in stool and abdominal distention.  Genitourinary: Negative for dysuria, urgency, frequency, hematuria, flank pain, vaginal bleeding, vaginal discharge, difficulty urinating, vaginal pain and pelvic pain.  Musculoskeletal: Negative for joint swelling, arthralgias and gait problem.  Skin: Negative for rash.  Neurological: Negative for dizziness, syncope, speech difficulty, weakness, numbness and headaches.  Hematological: Negative for adenopathy.  Psychiatric/Behavioral: Negative for behavioral problems, dysphoric mood and agitation. The patient is not nervous/anxious.        Objective:   Physical Exam  Constitutional: She is oriented to person, place, and time. She appears well-developed and well-nourished.  Appears unwell, but in no acute distress Pulse 65 Frequent paroxysms of coughing O2 saturation 97%  HENT:  Head: Normocephalic.  Right Ear: External ear normal.  Left Ear: External ear normal.  Mouth/Throat: Oropharynx is clear and moist.  Eyes: Conjunctivae and EOM are normal. Pupils are equal, round, and reactive to light.  Neck: Normal range of motion. Neck supple. No thyromegaly present.  Cardiovascular: Normal rate, regular rhythm, normal heart sounds and intact distal pulses.   Pulmonary/Chest: Effort normal.  Frequent paroxysms of coughing Coarse diffuse rhonchi  Abdominal: Soft. Bowel sounds are normal. She exhibits no mass. There is no tenderness.  Musculoskeletal: Normal range of motion.  Lymphadenopathy:    She has no cervical adenopathy.  Neurological: She is alert and oriented to person, place, and time.  Skin: Skin is warm and dry. No rash noted.  Psychiatric: She has a normal mood and affect. Her behavior is normal.           Assessment & Plan:   COPD exacerbation.  We'll increase Symbicort to 2 puffs twice a day.  Continue albuterol every 4-6 hours Continue expectorants and hydrocodone for cough Will treat with  prednisone Dosepak as well as azithromycin  Will call if she does not have prompt clinical improvement

## 2015-06-15 NOTE — Patient Instructions (Signed)
Take over-the-counter expectorants and cough medications such as  Mucinex DM.  Call if there is no improvement in 5 to 7 days or if  you develop worsening cough, fever, or new symptoms, such as shortness of breath or chest pain.  Drink as much fluid as you  can tolerate over the next few days  Chronic Obstructive Pulmonary Disease Exacerbation Chronic obstructive pulmonary disease (COPD) is a common lung condition in which airflow from the lungs is limited. COPD is a general term that can be used to describe many different lung problems that limit airflow, including chronic bronchitis and emphysema. COPD exacerbations are episodes when breathing symptoms become much worse and require extra treatment. Without treatment, COPD exacerbations can be life threatening, and frequent COPD exacerbations can cause further damage to your lungs. CAUSES  Respiratory infections.  Exposure to smoke.  Exposure to air pollution, chemical fumes, or dust. Sometimes there is no apparent cause or trigger. RISK FACTORS  Smoking cigarettes.  Older age.  Frequent prior COPD exacerbations. SIGNS AND SYMPTOMS  Increased coughing.  Increased thick spit (sputum) production.  Increased wheezing.  Increased shortness of breath.  Rapid breathing.  Chest tightness. DIAGNOSIS Your medical history, a physical exam, and tests will help your health care provider make a diagnosis. Tests may include:  A chest X-ray.  Basic lab tests.  Sputum testing.  An arterial blood gas test. TREATMENT Depending on the severity of your COPD exacerbation, you may need to be admitted to a hospital for treatment. Some of the treatments commonly used to treat COPD exacerbations are:   Antibiotic medicines.  Bronchodilators. These are drugs that expand the air passages. They may be given with an inhaler or nebulizer. Spacer devices may be needed to help improve drug delivery.  Corticosteroid medicines.  Supplemental  oxygen therapy.  Airway clearing techniques, such as noninvasive ventilation (NIV) and positive expiratory pressure (PEP). These provide respiratory support through a mask or other noninvasive device. HOME CARE INSTRUCTIONS  Do not smoke. Quitting smoking is very important to prevent COPD from getting worse and exacerbations from happening as often.  Avoid exposure to all substances that irritate the airway, especially to tobacco smoke.  If you were prescribed an antibiotic medicine, finish it all even if you start to feel better.  Take all medicines as directed by your health care provider.It is important to use correct technique with inhaled medicines.  Drink enough fluids to keep your urine clear or pale yellow (unless you have a medical condition that requires fluid restriction).  Use a cool mist vaporizer. This makes it easier to clear your chest when you cough.  If you have a home nebulizer and oxygen, continue to use them as directed.  Maintain all necessary vaccinations to prevent infections.  Exercise regularly.  Eat a healthy diet.  Keep all follow-up appointments as directed by your health care provider. SEEK IMMEDIATE MEDICAL CARE IF:  You have worsening shortness of breath.  You have trouble talking.  You have severe chest pain.  You have blood in your sputum.  You have a fever.  You have weakness, vomit repeatedly, or faint.  You feel confused.  You continue to get worse. MAKE SURE YOU:  Understand these instructions.  Will watch your condition.  Will get help right away if you are not doing well or get worse.   This information is not intended to replace advice given to you by your health care provider. Make sure you discuss any questions you have  with your health care provider.   Document Released: 01/19/2007 Document Revised: 04/14/2014 Document Reviewed: 11/26/2012 Elsevier Interactive Patient Education [employer].

## 2015-06-24 ENCOUNTER — Emergency Department (HOSPITAL_COMMUNITY): Payer: 59

## 2015-06-24 ENCOUNTER — Inpatient Hospital Stay (HOSPITAL_COMMUNITY)
Admission: EM | Admit: 2015-06-24 | Discharge: 2015-06-28 | DRG: 190 | Disposition: A | Discharge status: Home Health | Payer: 59 | Attending: Internal Medicine | Admitting: Internal Medicine

## 2015-06-24 ENCOUNTER — Encounter (HOSPITAL_COMMUNITY): Payer: Self-pay | Admitting: *Deleted

## 2015-06-24 DIAGNOSIS — R0902 Hypoxemia: Secondary | ICD-10-CM | POA: Diagnosis present

## 2015-06-24 DIAGNOSIS — R0600 Dyspnea, unspecified: Secondary | ICD-10-CM | POA: Diagnosis not present

## 2015-06-24 DIAGNOSIS — E785 Hyperlipidemia, unspecified: Secondary | ICD-10-CM | POA: Diagnosis present

## 2015-06-24 DIAGNOSIS — Z8261 Family history of arthritis: Secondary | ICD-10-CM

## 2015-06-24 DIAGNOSIS — J9621 Acute and chronic respiratory failure with hypoxia: Secondary | ICD-10-CM | POA: Diagnosis present

## 2015-06-24 DIAGNOSIS — Z9049 Acquired absence of other specified parts of digestive tract: Secondary | ICD-10-CM

## 2015-06-24 DIAGNOSIS — F17200 Nicotine dependence, unspecified, uncomplicated: Secondary | ICD-10-CM | POA: Diagnosis present

## 2015-06-24 DIAGNOSIS — J101 Influenza due to other identified influenza virus with other respiratory manifestations: Secondary | ICD-10-CM | POA: Diagnosis present

## 2015-06-24 DIAGNOSIS — Z833 Family history of diabetes mellitus: Secondary | ICD-10-CM

## 2015-06-24 DIAGNOSIS — J4489 Other specified chronic obstructive pulmonary disease: Secondary | ICD-10-CM | POA: Diagnosis present

## 2015-06-24 DIAGNOSIS — J441 Chronic obstructive pulmonary disease with (acute) exacerbation: Secondary | ICD-10-CM | POA: Diagnosis not present

## 2015-06-24 DIAGNOSIS — Z87891 Personal history of nicotine dependence: Secondary | ICD-10-CM

## 2015-06-24 DIAGNOSIS — R0603 Acute respiratory distress: Secondary | ICD-10-CM | POA: Diagnosis present

## 2015-06-24 DIAGNOSIS — J449 Chronic obstructive pulmonary disease, unspecified: Secondary | ICD-10-CM | POA: Diagnosis present

## 2015-06-24 DIAGNOSIS — Z808 Family history of malignant neoplasm of other organs or systems: Secondary | ICD-10-CM

## 2015-06-24 DIAGNOSIS — Z823 Family history of stroke: Secondary | ICD-10-CM

## 2015-06-24 DIAGNOSIS — R0602 Shortness of breath: Secondary | ICD-10-CM | POA: Diagnosis not present

## 2015-06-24 DIAGNOSIS — Z87442 Personal history of urinary calculi: Secondary | ICD-10-CM

## 2015-06-24 DIAGNOSIS — Z7951 Long term (current) use of inhaled steroids: Secondary | ICD-10-CM

## 2015-06-24 DIAGNOSIS — Z8249 Family history of ischemic heart disease and other diseases of the circulatory system: Secondary | ICD-10-CM

## 2015-06-24 DIAGNOSIS — R911 Solitary pulmonary nodule: Secondary | ICD-10-CM | POA: Diagnosis present

## 2015-06-24 DIAGNOSIS — F419 Anxiety disorder, unspecified: Secondary | ICD-10-CM | POA: Diagnosis present

## 2015-06-24 DIAGNOSIS — Z820 Family history of epilepsy and other diseases of the nervous system: Secondary | ICD-10-CM

## 2015-06-24 LAB — I-STAT ARTERIAL BLOOD GAS, ED
ACID-BASE DEFICIT: 2 mmol/L (ref 0.0–2.0)
BICARBONATE: 22.9 meq/L (ref 20.0–24.0)
O2 Saturation: 93 %
PH ART: 7.376 (ref 7.350–7.450)
TCO2: 24 mmol/L (ref 0–100)
pCO2 arterial: 38.7 mmHg (ref 35.0–45.0)
pO2, Arterial: 63 mmHg — ABNORMAL LOW (ref 80.0–100.0)

## 2015-06-24 LAB — CBC
HCT: 44.7 % (ref 36.0–46.0)
Hemoglobin: 14.7 g/dL (ref 12.0–15.0)
MCH: 31.1 pg (ref 26.0–34.0)
MCHC: 32.9 g/dL (ref 30.0–36.0)
MCV: 94.5 fL (ref 78.0–100.0)
PLATELETS: 247 10*3/uL (ref 150–400)
RBC: 4.73 MIL/uL (ref 3.87–5.11)
RDW: 14.7 % (ref 11.5–15.5)
WBC: 12.4 10*3/uL — AB (ref 4.0–10.5)

## 2015-06-24 LAB — BASIC METABOLIC PANEL
Anion gap: 11 (ref 5–15)
BUN: 16 mg/dL (ref 6–20)
CALCIUM: 9.2 mg/dL (ref 8.9–10.3)
CO2: 25 mmol/L (ref 22–32)
CREATININE: 0.77 mg/dL (ref 0.44–1.00)
Chloride: 105 mmol/L (ref 101–111)
Glucose, Bld: 98 mg/dL (ref 65–99)
Potassium: 4.3 mmol/L (ref 3.5–5.1)
SODIUM: 141 mmol/L (ref 135–145)

## 2015-06-24 LAB — INFLUENZA PANEL BY PCR (TYPE A & B)
H1N1FLUPCR: NOT DETECTED
INFLAPCR: POSITIVE — AB
INFLBPCR: NEGATIVE

## 2015-06-24 LAB — I-STAT TROPONIN, ED: TROPONIN I, POC: 0 ng/mL (ref 0.00–0.08)

## 2015-06-24 LAB — TSH: TSH: 0.489 u[IU]/mL (ref 0.350–4.500)

## 2015-06-24 MED ORDER — ONDANSETRON HCL 4 MG/2ML IJ SOLN
4.0000 mg | Freq: Four times a day (QID) | INTRAMUSCULAR | Status: DC | PRN
Start: 2015-06-24 — End: 2015-06-28

## 2015-06-24 MED ORDER — MOMETASONE FURO-FORMOTEROL FUM 200-5 MCG/ACT IN AERO
2.0000 | INHALATION_SPRAY | Freq: Two times a day (BID) | RESPIRATORY_TRACT | Status: DC
  Filled 2015-06-24: qty 8.8

## 2015-06-24 MED ORDER — METHYLPREDNISOLONE SODIUM SUCC 125 MG IJ SOLR
60.0000 mg | Freq: Two times a day (BID) | INTRAMUSCULAR | Status: DC
  Administered 2015-06-25: 60 mg via INTRAVENOUS
  Filled 2015-06-24: qty 2

## 2015-06-24 MED ORDER — ENOXAPARIN SODIUM 40 MG/0.4ML ~~LOC~~ SOLN
40.0000 mg | SUBCUTANEOUS | Status: DC
  Administered 2015-06-24 – 2015-06-27 (×4): 40 mg via SUBCUTANEOUS
  Filled 2015-06-24 (×5): qty 0.4

## 2015-06-24 MED ORDER — ALPRAZOLAM 0.25 MG PO TABS
0.2500 mg | ORAL_TABLET | Freq: Two times a day (BID) | ORAL | Status: DC | PRN
  Administered 2015-06-25 – 2015-06-27 (×4): 0.25 mg via ORAL
  Filled 2015-06-24 (×4): qty 1

## 2015-06-24 MED ORDER — ALBUTEROL SULFATE (2.5 MG/3ML) 0.083% IN NEBU
2.5000 mg | INHALATION_SOLUTION | RESPIRATORY_TRACT | Status: DC
  Filled 2015-06-24: qty 3

## 2015-06-24 MED ORDER — SODIUM CHLORIDE 0.9 % IV SOLN
INTRAVENOUS | Status: AC
  Administered 2015-06-24: 15:00:00 via INTRAVENOUS

## 2015-06-24 MED ORDER — METHYLPREDNISOLONE SODIUM SUCC 125 MG IJ SOLR
125.0000 mg | Freq: Once | INTRAMUSCULAR | Status: AC
  Administered 2015-06-24: 125 mg via INTRAVENOUS
  Filled 2015-06-24: qty 2

## 2015-06-24 MED ORDER — METHYLPREDNISOLONE SODIUM SUCC 125 MG IJ SOLR: 60.0000 mg | Freq: Four times a day (QID) | INTRAMUSCULAR | Status: DC

## 2015-06-24 MED ORDER — HYDROCODONE-ACETAMINOPHEN 7.5-325 MG PO TABS: 1.0000 | ORAL_TABLET | Freq: Four times a day (QID) | ORAL | Status: DC | PRN

## 2015-06-24 MED ORDER — AZITHROMYCIN 500 MG PO TABS
250.0000 mg | ORAL_TABLET | Freq: Every day | ORAL | Status: AC
  Administered 2015-06-25 – 2015-06-28 (×4): 250 mg via ORAL
  Filled 2015-06-24 (×4): qty 1

## 2015-06-24 MED ORDER — SODIUM CHLORIDE 0.9 % IV BOLUS (SEPSIS)
1000.0000 mL | Freq: Once | INTRAVENOUS | Status: AC
  Administered 2015-06-24: 1000 mL via INTRAVENOUS

## 2015-06-24 MED ORDER — MAGNESIUM SULFATE 2 GM/50ML IV SOLN
2.0000 g | Freq: Once | INTRAVENOUS | Status: AC
  Administered 2015-06-24: 2 g via INTRAVENOUS
  Filled 2015-06-24: qty 50

## 2015-06-24 MED ORDER — MORPHINE SULFATE (PF) 2 MG/ML IV SOLN: 1.0000 mg | INTRAVENOUS | Status: DC | PRN

## 2015-06-24 MED ORDER — ONDANSETRON HCL 4 MG PO TABS: 4.0000 mg | ORAL_TABLET | Freq: Four times a day (QID) | ORAL | Status: DC | PRN

## 2015-06-24 MED ORDER — ACETAMINOPHEN 325 MG PO TABS: 650.0000 mg | ORAL_TABLET | Freq: Four times a day (QID) | ORAL | Status: DC | PRN

## 2015-06-24 MED ORDER — POLYETHYLENE GLYCOL 3350 17 G PO PACK: 17.0000 g | PACK | Freq: Every day | ORAL | Status: DC | PRN

## 2015-06-24 MED ORDER — ALBUTEROL (5 MG/ML) CONTINUOUS INHALATION SOLN
10.0000 mg/h | INHALATION_SOLUTION | RESPIRATORY_TRACT | Status: DC
  Administered 2015-06-24: 10 mg/h via RESPIRATORY_TRACT
  Filled 2015-06-24: qty 20

## 2015-06-24 MED ORDER — ALBUTEROL SULFATE (2.5 MG/3ML) 0.083% IN NEBU
2.5000 mg | INHALATION_SOLUTION | RESPIRATORY_TRACT | Status: DC | PRN
  Administered 2015-06-24: 2.5 mg via RESPIRATORY_TRACT
  Filled 2015-06-24: qty 3

## 2015-06-24 MED ORDER — PRAVASTATIN SODIUM 20 MG PO TABS
20.0000 mg | ORAL_TABLET | Freq: Every evening | ORAL | Status: DC
  Administered 2015-06-24 – 2015-06-27 (×4): 20 mg via ORAL
  Filled 2015-06-24 (×4): qty 1

## 2015-06-24 MED ORDER — IPRATROPIUM BROMIDE 0.02 % IN SOLN
0.5000 mg | Freq: Once | RESPIRATORY_TRACT | Status: AC
  Administered 2015-06-24: 0.5 mg via RESPIRATORY_TRACT
  Filled 2015-06-24: qty 2.5

## 2015-06-24 MED ORDER — TIOTROPIUM BROMIDE MONOHYDRATE 18 MCG IN CAPS: 18.0000 ug | ORAL_CAPSULE | Freq: Every day | RESPIRATORY_TRACT | Status: DC

## 2015-06-24 MED ORDER — PAROXETINE HCL ER 25 MG PO TB24
25.0000 mg | ORAL_TABLET | Freq: Every day | ORAL | Status: DC
  Administered 2015-06-25 – 2015-06-28 (×4): 25 mg via ORAL
  Filled 2015-06-24 (×9): qty 1

## 2015-06-24 MED ORDER — ACETAMINOPHEN 650 MG RE SUPP: 650.0000 mg | Freq: Four times a day (QID) | RECTAL | Status: DC | PRN

## 2015-06-24 MED ORDER — HYDROCOD POLST-CPM POLST ER 10-8 MG/5ML PO SUER
5.0000 mL | Freq: Two times a day (BID) | ORAL | Status: DC
  Administered 2015-06-24 – 2015-06-28 (×9): 5 mL via ORAL
  Filled 2015-06-24 (×9): qty 5

## 2015-06-24 MED ORDER — MUCINEX DM MAXIMUM STRENGTH 60-1200 MG PO TB12: 1200.0000 mg | ORAL_TABLET | Freq: Two times a day (BID) | ORAL | Status: DC

## 2015-06-24 MED ORDER — AZITHROMYCIN 500 MG PO TABS
500.0000 mg | ORAL_TABLET | Freq: Every day | ORAL | Status: AC
  Administered 2015-06-24: 500 mg via ORAL
  Filled 2015-06-24: qty 1

## 2015-06-24 MED ORDER — SODIUM CHLORIDE 0.9% FLUSH
3.0000 mL | Freq: Two times a day (BID) | INTRAVENOUS | Status: DC
  Administered 2015-06-25 – 2015-06-27 (×6): 3 mL via INTRAVENOUS

## 2015-06-24 MED ORDER — DM-GUAIFENESIN ER 30-600 MG PO TB12
1.0000 | ORAL_TABLET | Freq: Two times a day (BID) | ORAL | Status: DC
  Administered 2015-06-24 – 2015-06-28 (×8): 1 via ORAL
  Filled 2015-06-24 (×8): qty 1

## 2015-06-24 MED ORDER — OSELTAMIVIR PHOSPHATE 75 MG PO CAPS
75.0000 mg | ORAL_CAPSULE | Freq: Two times a day (BID) | ORAL | Status: DC
  Administered 2015-06-24 – 2015-06-28 (×8): 75 mg via ORAL
  Filled 2015-06-24 (×12): qty 1

## 2015-06-24 MED ORDER — TIOTROPIUM BROMIDE MONOHYDRATE 18 MCG IN CAPS
18.0000 ug | ORAL_CAPSULE | Freq: Every day | RESPIRATORY_TRACT | Status: DC
  Administered 2015-06-24: 18 ug via RESPIRATORY_TRACT
  Filled 2015-06-24: qty 5

## 2015-06-24 NOTE — ED Provider Notes (Signed)
CSN: [id number]     Arrival date & time 06/24/15  0931 History   First MD Initiated Contact with Patient 06/24/15 1022     Chief Complaint  Patient presents with  . Shortness of Breath  . Fever     (Consider location/radiation/quality/duration/timing/severity/associated sxs/prior Treatment) HPI Comments: Patient with history of COPD presenting with worsening difficulty breathing, cough and congestion over the past week. She saw her PCP on March 10 was given a course of steroids which she has 1 day left. She reports she is having productive cough of clear and yellow mucus, worsening breathing, congestion and fever last night to 101. She endorses constant chest tightness. Denies any leg pain is like swelling. States she quit smoking in September. Denies any abdominal pain, nausea or vomiting. Denies any sick contacts. She did receive a flu shot.  The history is provided by the patient and a relative. The history is limited by the condition of the patient.    Past Medical History  Diagnosis Date  . C8 RADICULOPATHY 06/05/2009  . COPD 08/31/2007  . DEPRESSION 11/19/2006  . DYSLIPIDEMIA 03/12/2009  . 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 OSTEOARTHRITIS 06/05/2009  . PARESTHESIA 08/24/2007  . TOBACCO ABUSE 01/25/2010   Past Surgical History  Procedure Laterality Date  . Appendectomy  1969  . Cholecystectomy  1989  . Exploratory laparotomy  1969  . Collapse lung  1990   Family History  Problem Relation Age of Onset  . Cancer Mother     lung  . Heart attack Mother   . Hypertension Sister   . Multiple sclerosis Brother   . Multiple sclerosis Other   . Alcohol abuse Other   . Arthritis Other   . Diabetes Other   . Kidney disease Other   . Cancer Other     lung,ovarian,skin, uterine  . Stroke Other   . Heart disease Other   . Melanoma Other   . Diabetes Sister   . Rheum arthritis Sister   . Thyroid disease Sister   . Colon cancer Neg Hx   . Heart attack Maternal Uncle   . Heart attack  Other     NEICE  . Heart attack Other     NEPHEW  . Hypertension Brother   . Stroke Maternal Grandmother    Social History  Substance Use Topics  . Smoking status: Former Smoker -- 1.50 packs/day for 30 years    Types: Cigarettes    Quit date: 12/21/2013  . Smokeless tobacco: Never Used  . Alcohol Use: No   OB History    No data available     Review of Systems  Constitutional: Positive for fever.  HENT: Negative for congestion.   Respiratory: Positive for cough, chest tightness and shortness of breath.   Cardiovascular: Negative for chest pain.  Gastrointestinal: Negative for nausea, vomiting and abdominal pain.  Genitourinary: Negative for dysuria, hematuria, vaginal bleeding and vaginal discharge.  Musculoskeletal: Negative for arthralgias.  Skin: Negative for rash.  Neurological: Negative for dizziness, weakness, light-headedness and headaches.  A complete 10 system review of systems was obtained and all systems are negative except as noted in the HPI and PMH.      Allergies  Penicillins  Home Medications   Prior to Admission medications   Medication Sig Start Date End Date Taking? Authorizing Provider  albuterol (PROVENTIL HFA;VENTOLIN HFA) 108 (90 BASE) MCG/ACT inhaler Inhale 2 puffs into the lungs every 4 (four) hours as needed for wheezing or shortness of breath.  Yes Historical Provider, MD  ALPRAZolam Duanne Moron) 0.5 MG tablet TAKE 1/2 TO 1 TABLET BY MOUTH 3 TIMES A DAY AS NEEDED 04/11/15  Yes Marletta Lor, MD  budesonide-formoterol [facility]) 160-4.5 MCG/ACT inhaler Inhale 2 puffs into the lungs 2 (two) times daily. 09/11/14  Yes Marletta Lor, MD  Cholecalciferol (VITAMIN D3) 1000 UNITS CAPS Take 1 capsule by mouth 2 (two) times daily.  04/01/14  Yes Historical Provider, MD  Dextromethorphan-Guaifenesin (MUCINEX DM MAXIMUM STRENGTH) 60-1200 MG TB12 Take 1,200 mg by mouth 2 (two) times daily.   Yes Historical Provider, MD  HYDROcodone-acetaminophen  (NORCO) 7.5-325 MG tablet Take 1 tablet by mouth every 6 (six) hours as needed for moderate pain. 06/15/15  Yes Marletta Lor, MD  nystatin (MYCOSTATIN) 100000 UNIT/ML suspension Take 5 mLs (500,000 Units total) by mouth 4 (four) times daily. Patient taking differently: Take 5 mLs by mouth daily as needed.  04/24/14  Yes Marletta Lor, MD  PAXIL CR 25 MG 24 hr tablet Take 1 tablet by mouth  every morning 05/04/15  Yes Marletta Lor, MD  pravastatin (PRAVACHOL) 20 MG tablet Take 1 tablet (20 mg total) by mouth every evening. 07/13/14  Yes [name], MD  predniSONE (DELTASONE) 10 MG tablet 3 tablets twice daily for 2 days, then 2 tablets twice daily for 2 days,  then 1 tablet twice daily for 2 days, then one tablet daily  for 6 days 06/15/15  Yes Marletta Lor, MD  [facility] HANDIHALER 18 MCG inhalation capsule Inhale contents of 1 capsule daily 04/01/14  Yes Historical Provider, MD  azithromycin (ZITHROMAX) 250 MG tablet 2 tablets once daily for 3 consecutive days Patient not taking: Reported on 06/24/2015 06/15/15   Marletta Lor, MD   BP 137/54 mmHg  Pulse 92  Temp(Src) 98.7 F (37.1 C) (Oral)  Resp 18  Ht 5\' 3"  (1.6 m)  Wt 122 lb (55.339 kg)  BMI 21.62 kg/m2  SpO2 91% Physical Exam  Constitutional: She is oriented to person, place, and time. She appears well-developed and well-nourished. She appears distressed.  HENT:  Head: Normocephalic and atraumatic.  Mouth/Throat: Oropharynx is clear and moist. No oropharyngeal exudate.  Eyes: Conjunctivae and EOM are normal. Pupils are equal, round, and reactive to light.  Neck: Normal range of motion. Neck supple.  No meningismus.  Cardiovascular: Normal rate, regular rhythm, normal heart sounds and intact distal pulses.   No murmur heard. Pulmonary/Chest: Effort normal. No respiratory distress. She has wheezes.  Diffuse inspiratory and expiratory wheezing  Abdominal: Soft. There is no tenderness. There is no  rebound and no guarding.  Musculoskeletal: Normal range of motion. She exhibits no edema or tenderness.  Neurological: She is alert and oriented to person, place, and time. No cranial nerve deficit. She exhibits normal muscle tone. Coordination normal.  No ataxia on finger to nose bilaterally. No pronator drift. 5/5 strength throughout. CN 2-12 intact.Equal grip strength. Sensation intact.   Skin: Skin is warm.  Psychiatric: She has a normal mood and affect. Her behavior is normal.  Nursing note and vitals reviewed.   ED Course  Procedures (including critical care time) Labs Review Labs Reviewed  CBC - Abnormal; Notable for the following:    WBC 12.4 (*)    All other components within normal limits  I-STAT ARTERIAL BLOOD GAS, ED - Abnormal; Notable for the following:    pO2, Arterial 63.0 (*)    All other components within normal limits  BASIC METABOLIC PANEL  TSH  INFLUENZA PANEL BY PCR (TYPE A & B, [id number])  BASIC METABOLIC PANEL  CBC  I-STAT TROPOININ, ED    Imaging Review Dg Chest 2 View  06/24/2015  CLINICAL DATA:  Shortness of breath and cough EXAM: CHEST  2 VIEW COMPARISON:  Chest radiograph December 29, 2012 and chest CT November 22, 2014 FINDINGS: Lungs remain hyperexpanded with slight bibasilar scarring. There is no edema or consolidation. Heart size and pulmonary vascularity are within normal limits. No adenopathy. Small nodular opacities seen on prior chest CT are not appreciable by radiography. No bone lesions. IMPRESSION: Lungs hyperexpanded with mild bibasilar scarring. No edema or consolidation. Electronically Signed   By: Lowella Grip III M.D.   On: 06/24/2015 11:32   I have personally reviewed and evaluated these images and lab results as part of my medical decision-making.   EKG Interpretation   Date/Time:  Sunday June 24 2015 09:37:07 EDT Ventricular Rate:  88 PR Interval:  138 QRS Duration: 78 QT Interval:  334 QTC Calculation: 404 R Axis:   72 Text  Interpretation:  Normal sinus rhythm Anterior infarct , age  undetermined Abnormal ECG Artifact No previous ECGs available Confirmed by  Christen Bedoya  MD, Saahil Herbster [phone]) on 06/24/2015 10:56:01 AM      MDM   Final diagnoses:  COPD exacerbation (HCC)  SOB with coughing and wheezing, completed zithromax and prednisone course. C/o chest tightness.  Wheezing on exam, tachypneic.  Nebs, steroids, CXR.  EKG unchanged.  CXR negative for infiltrate. No CO2 retention on ABG.  WOB improved. Still dyspneic with conversation.  86% with ambulation.  Does not wear O2 at home.  Admission d/w NP Black.    Ezequiel Essex, MD 06/24/15 Tresa Moore

## 2015-06-24 NOTE — ED Notes (Signed)
Pt reports hx of copd, started feeling bad earlier in the week and went to pcp, started on multiple meds but no relief. Still coughing, sob and fever.

## 2015-06-24 NOTE — ED Notes (Signed)
Patient was ambulated on room air and her o2 sat dropped to 87 and her heart rate was 112. She stated that she did not feel light headed.

## 2015-06-24 NOTE — H&P (Signed)
[facility] History and Physical  Kim [name] [id number] DOB: Aug 05, 1955 DOA: 06/24/2015  Referring physician: Wyvonnia Dusky PCP: [name], MD   Chief Complaint: sob/fever  HPI: Kim [name] is a pleasant 60 y.o. female with a past medical history that includes COPD, depression, dyslipidemia, tobacco use quit smoking 15 months ago presents to emergency Department chief complaint worsening shortness of breath/cough/fever. Initial evaluation reveals acute respiratory distress likely related to COPD exacerbation.  Information is obtained from the patient who reports worsening shortness of breath increased cough and increased sputum production over the last week. She saw her PCP March 10 and was given azithromycin a prednisone taper and inhaler. Reports compliance continued to worsen. Associated symptoms include intermittent chills and last night she had a fever of 100.1. Addition she complains of chest "tightness" with coughing. She denies any palpitations headache dizziness syncope or near-syncope. She denies any lower extremity edema or orthopnea. She does not require oxygen at home. She denies any abdominal pain nausea vomiting diarrhea constipation. No dysuria hematuria frequency or urgency.  In the emergency department she is a temperature of 99.6 tachypnea at the rate of 27, oxygen saturation level is 90 on 2 L she is tachycardic with stable blood pressure. Provided with iv fluids Solu-Medrol respiratory status much improved on admission  Review of Systems:  And point review of systems complete and all systems are negative except as indicated in the history of present illness  Past Medical History  Diagnosis Date  . C8 RADICULOPATHY 06/05/2009  . COPD 08/31/2007  . DEPRESSION 11/19/2006  . DYSLIPIDEMIA 03/12/2009  . 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 OSTEOARTHRITIS 06/05/2009  . PARESTHESIA 08/24/2007  . TOBACCO ABUSE 01/25/2010   Past Surgical History  Procedure  Laterality Date  . Appendectomy  1969  . Cholecystectomy  1989  . Exploratory laparotomy  1969  . Collapse lung  1990   Social History:  reports that she quit smoking about 18 months ago. Her smoking use included Cigarettes. She has a 45 pack-year smoking history. She has never used smokeless tobacco. She reports that she does not drink alcohol or use illicit drugs. 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 Penicillins Hives    Has patient had a PCN reaction causing immediate rash, facial/tongue/throat swelling, SOB or lightheadedness with hypotension: Yes Has patient had a PCN reaction causing severe rash involving mucus membranes or skin necrosis: No Has patient had a PCN reaction that required hospitalization No Has patient had a PCN reaction occurring within the last 10 years: No If all of the above answers are "NO", then may proceed with Cephalosporin use.     Family History  Problem Relation Age of Onset  . Cancer Mother     lung  . Heart attack Mother   . Hypertension Sister   . Multiple sclerosis Brother   . Multiple sclerosis Other   . Alcohol abuse Other   . Arthritis Other   . Diabetes Other   . Kidney disease Other   . Cancer Other     lung,ovarian,skin, uterine  . Stroke Other   . Heart disease Other   . Melanoma Other   . Diabetes Sister   . Rheum arthritis Sister   . Thyroid disease Sister   . Colon cancer Neg Hx   . Heart attack Maternal Uncle   . Heart attack Other  NEICE  . Heart attack Other     NEPHEW  . Hypertension Brother   . Stroke Maternal Grandmother      Prior to Admission medications   Medication Sig Start Date End Date Taking? Authorizing Provider  albuterol (PROVENTIL HFA;VENTOLIN HFA) 108 (90 BASE) MCG/ACT inhaler Inhale 2 puffs into the lungs every 4 (four) hours as needed for wheezing or shortness of breath.    Yes Historical Provider, MD  ALPRAZolam Duanne Moron) 0.5 MG tablet TAKE 1/2 TO 1 TABLET BY MOUTH 3 TIMES A DAY AS NEEDED 04/11/15  Yes Marletta Lor, MD  budesonide-formoterol [facility]) 160-4.5 MCG/ACT inhaler Inhale 2 puffs into the lungs 2 (two) times daily. 09/11/14  Yes Marletta Lor, MD  Cholecalciferol (VITAMIN D3) 1000 UNITS CAPS Take 1 capsule by mouth 2 (two) times daily.  04/01/14  Yes Historical Provider, MD  Dextromethorphan-Guaifenesin (MUCINEX DM MAXIMUM STRENGTH) 60-1200 MG TB12 Take 1,200 mg by mouth 2 (two) times daily.   Yes Historical Provider, MD  HYDROcodone-acetaminophen (NORCO) 7.5-325 MG tablet Take 1 tablet by mouth every 6 (six) hours as needed for moderate pain. 06/15/15  Yes Marletta Lor, MD  nystatin (MYCOSTATIN) 100000 UNIT/ML suspension Take 5 mLs (500,000 Units total) by mouth 4 (four) times daily. Patient taking differently: Take 5 mLs by mouth daily as needed.  04/24/14  Yes Marletta Lor, MD  PAXIL CR 25 MG 24 hr tablet Take 1 tablet by mouth  every morning 05/04/15  Yes Marletta Lor, MD  pravastatin (PRAVACHOL) 20 MG tablet Take 1 tablet (20 mg total) by mouth every evening. 07/13/14  Yes [name], MD  predniSONE (DELTASONE) 10 MG tablet 3 tablets twice daily for 2 days, then 2 tablets twice daily for 2 days,  then 1 tablet twice daily for 2 days, then one tablet daily  for 6 days 06/15/15  Yes Marletta Lor, MD  [facility] HANDIHALER 18 MCG inhalation capsule Inhale contents of 1 capsule daily 04/01/14  Yes Historical Provider, MD  azithromycin (ZITHROMAX) 250 MG tablet 2 tablets once daily for 3 consecutive days Patient not taking: Reported on 06/24/2015 06/15/15   Marletta Lor, MD   Physical Exam: Filed Vitals:   06/24/15 1200 06/24/15 1230 06/24/15 1351 06/24/15 1430  BP: 135/66 128/69 142/81 105/59  Pulse: 103 109 99 92  Temp:      TempSrc:      Resp: 25 23 16 25   SpO2: 98% 96% 92% 94%    Wt Readings from Last  3 Encounters:  06/15/15 55.339 kg (122 lb)  02/07/15 53.524 kg (118 lb)  12/20/14 53.978 kg (119 lb)    General:  Appears calm and comfortable, lying on her side Eyes: PERRL, normal lids, irises & conjunctiva ENT: grossly normal hearing, lips & tongue Neck: no LAD, masses or thyromegaly Cardiovascular: RRR, no m/r/g. No LE edema. Respiratory: Normal effort with conversation. Breath sounds quite diminished throughout hear faint end expiratory wheezing throughout no crackles Abdomen: soft, ntnd no guarding or rebounding Skin: no rash or induration seen on limited exam Musculoskeletal: grossly normal tone BUE/BLE Psychiatric: grossly normal mood and affect, speech fluent and appropriate Neurologic: grossly non-focal. Speech clear facial symmetry           Labs on Admission:  Basic Metabolic Panel:  Recent Labs Lab 06/24/15 1043  NA 141  K 4.3  CL 105  CO2 25  GLUCOSE 98  BUN 16  CREATININE 0.77  CALCIUM 9.2   Liver Function  Tests: No results for input(s): AST, ALT, ALKPHOS, BILITOT, PROT, ALBUMIN in the last 168 hours. No results for input(s): LIPASE, AMYLASE in the last 168 hours. No results for input(s): AMMONIA in the last 168 hours. CBC:  Recent Labs Lab 06/24/15 1043  WBC 12.4*  HGB 14.7  HCT 44.7  MCV 94.5  PLT 247   Cardiac Enzymes: No results for input(s): CKTOTAL, CKMB, CKMBINDEX, TROPONINI in the last 168 hours.  BNP (last 3 results) No results for input(s): BNP in the last 8760 hours.  ProBNP (last 3 results) No results for input(s): PROBNP in the last 8760 hours.  CBG: No results for input(s): GLUCAP in the last 168 hours.  Radiological Exams on Admission: Dg Chest 2 View  06/24/2015  CLINICAL DATA:  Shortness of breath and cough EXAM: CHEST  2 VIEW COMPARISON:  Chest radiograph December 29, 2012 and chest CT November 22, 2014 FINDINGS: Lungs remain hyperexpanded with slight bibasilar scarring. There is no edema or consolidation. Heart size and  pulmonary vascularity are within normal limits. No adenopathy. Small nodular opacities seen on prior chest CT are not appreciable by radiography. No bone lesions. IMPRESSION: Lungs hyperexpanded with mild bibasilar scarring. No edema or consolidation. Electronically Signed   By: Lowella Grip III M.D.   On: 06/24/2015 11:32    EKG: Independently reviewed. Normal sinus rhythm Anterior infarct , age undeterminedAbnormal ECG  Assessment/Plan Principal Problem:   Acute respiratory distress (HCC) Active Problems:   Dyslipidemia   TOBACCO ABUSE   COPD (chronic obstructive pulmonary disease) (HCC)   Solitary pulmonary nodule   COPD exacerbation ([place])   Hypoxia  #1. Acute respiratory distress/hypoxia related to COPD exacerbation. Patient saw PCP last week yields to improve in spite of prednisone taper azithromycin and inhaler. She is not on oxygen at home. She with a pH of 7.37 PCO2 38.7 PO2 63. Chest x-ray reveals hyperexpanded with mild bibasilar scarring no edema or consolidation. -Admit to telemetry -Continue oxygen supplementation -Wean oxygen as able -Solu-Medrol 60 mg every 12 hours -Azithromycin given fevers -Nebulizers every 4 hours PRN -Antitussives -Continue home inhaler -Flutter valve  2. COPD. chart review indicates pulmonology visit 6 months ago. Chart review indicates function testing shows that her FEV1 is dropped from 1.4 L2 1.0L over previous 5 years. That time her recommended continuation of Dulera and Spiriva and continuation of smoking cessation -Monitor oxygen saturation level closely  #3. Solitary pulmonary nodule. Review indicates this was assessed 6 months ago by pulmonology. [facility] follow-up CT one year from that time  4. Tobacco abuse. Patient stopped smoking 15 months ago     Code Status: full DVT Prophylaxis: Family Communication: none present Disposition Plan: home when ready hopefully 36 hours  Time spent: 53 minute  [facility]   I have examined the patient, reviewed the chart and modified the above note which I agree with. No on O2 at baseline- on 2 L with pulse ox of 94 % at this time. Very poor air entry but not distress at this time- just received a 1 hr neb. I noted a dry cough. Treated a little over a week ago with 3 days of Zithro and a Prednisone taper- she was down to 10 mg which she could not take today. She states that as she weaned off of the Prednisone, her symptoms worsened. Treatment- cont home inhalers, PRN Nebs, IV steroids, resume Zithromax.  Kamdon Reisig,MD Pager # [phone] [email or web address] 06/24/2015, 3:34 PM
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
## 2015-06-24 NOTE — ED Notes (Signed)
Admitting NP at bedside

## 2015-06-25 ENCOUNTER — Telehealth: Payer: Self-pay | Admitting: Internal Medicine

## 2015-06-25 ENCOUNTER — Telehealth: Payer: Self-pay | Admitting: Emergency Medicine

## 2015-06-25 DIAGNOSIS — Z9049 Acquired absence of other specified parts of digestive tract: Secondary | ICD-10-CM | POA: Diagnosis not present

## 2015-06-25 DIAGNOSIS — E785 Hyperlipidemia, unspecified: Secondary | ICD-10-CM | POA: Diagnosis present

## 2015-06-25 DIAGNOSIS — R0600 Dyspnea, unspecified: Secondary | ICD-10-CM | POA: Diagnosis not present

## 2015-06-25 DIAGNOSIS — J418 Mixed simple and mucopurulent chronic bronchitis: Secondary | ICD-10-CM | POA: Diagnosis not present

## 2015-06-25 DIAGNOSIS — Z808 Family history of malignant neoplasm of other organs or systems: Secondary | ICD-10-CM | POA: Diagnosis not present

## 2015-06-25 DIAGNOSIS — Z820 Family history of epilepsy and other diseases of the nervous system: Secondary | ICD-10-CM | POA: Diagnosis not present

## 2015-06-25 DIAGNOSIS — F419 Anxiety disorder, unspecified: Secondary | ICD-10-CM | POA: Diagnosis present

## 2015-06-25 DIAGNOSIS — J9621 Acute and chronic respiratory failure with hypoxia: Secondary | ICD-10-CM | POA: Diagnosis present

## 2015-06-25 DIAGNOSIS — R911 Solitary pulmonary nodule: Secondary | ICD-10-CM | POA: Diagnosis present

## 2015-06-25 DIAGNOSIS — Z833 Family history of diabetes mellitus: Secondary | ICD-10-CM | POA: Diagnosis not present

## 2015-06-25 DIAGNOSIS — Z7951 Long term (current) use of inhaled steroids: Secondary | ICD-10-CM | POA: Diagnosis not present

## 2015-06-25 DIAGNOSIS — Z87442 Personal history of urinary calculi: Secondary | ICD-10-CM | POA: Diagnosis not present

## 2015-06-25 DIAGNOSIS — J101 Influenza due to other identified influenza virus with other respiratory manifestations: Secondary | ICD-10-CM | POA: Diagnosis present

## 2015-06-25 DIAGNOSIS — R0902 Hypoxemia: Secondary | ICD-10-CM | POA: Diagnosis not present

## 2015-06-25 DIAGNOSIS — J42 Unspecified chronic bronchitis: Secondary | ICD-10-CM | POA: Diagnosis not present

## 2015-06-25 DIAGNOSIS — Z87891 Personal history of nicotine dependence: Secondary | ICD-10-CM | POA: Diagnosis not present

## 2015-06-25 DIAGNOSIS — Z8249 Family history of ischemic heart disease and other diseases of the circulatory system: Secondary | ICD-10-CM | POA: Diagnosis not present

## 2015-06-25 DIAGNOSIS — J441 Chronic obstructive pulmonary disease with (acute) exacerbation: Secondary | ICD-10-CM | POA: Diagnosis not present

## 2015-06-25 DIAGNOSIS — Z8261 Family history of arthritis: Secondary | ICD-10-CM | POA: Diagnosis not present

## 2015-06-25 DIAGNOSIS — Z823 Family history of stroke: Secondary | ICD-10-CM | POA: Diagnosis not present

## 2015-06-25 DIAGNOSIS — R0602 Shortness of breath: Secondary | ICD-10-CM | POA: Diagnosis present

## 2015-06-25 LAB — BASIC METABOLIC PANEL
Anion gap: 7 (ref 5–15)
BUN: 16 mg/dL (ref 6–20)
CHLORIDE: 111 mmol/L (ref 101–111)
CO2: 24 mmol/L (ref 22–32)
Calcium: 8.7 mg/dL — ABNORMAL LOW (ref 8.9–10.3)
Creatinine, Ser: 0.65 mg/dL (ref 0.44–1.00)
GFR calc Af Amer: >60 mL/min (ref >60)
GFR calc non Af Amer: >60 mL/min (ref >60)
GLUCOSE: 161 mg/dL — AB (ref 65–99)
POTASSIUM: 4.8 mmol/L (ref 3.5–5.1)
Sodium: 142 mmol/L (ref 135–145)

## 2015-06-25 LAB — CBC
HEMATOCRIT: 39.7 % (ref 36.0–46.0)
Hemoglobin: 12.7 g/dL (ref 12.0–15.0)
MCH: 31 pg (ref 26.0–34.0)
MCHC: 32 g/dL (ref 30.0–36.0)
MCV: 96.8 fL (ref 78.0–100.0)
Platelets: 217 10*3/uL (ref 150–400)
RBC: 4.1 MIL/uL (ref 3.87–5.11)
RDW: 15.3 % (ref 11.5–15.5)
WBC: 12.9 10*3/uL — ABNORMAL HIGH (ref 4.0–10.5)

## 2015-06-25 MED ORDER — METHYLPREDNISOLONE SODIUM SUCC 125 MG IJ SOLR
60.0000 mg | Freq: Three times a day (TID) | INTRAMUSCULAR | Status: DC
  Administered 2015-06-25 – 2015-06-26 (×3): 60 mg via INTRAVENOUS
  Filled 2015-06-25 (×4): qty 2

## 2015-06-25 MED ORDER — IPRATROPIUM-ALBUTEROL 0.5-2.5 (3) MG/3ML IN SOLN
3.0000 mL | Freq: Four times a day (QID) | RESPIRATORY_TRACT | Status: DC
Start: 2015-06-26 — End: 2015-06-27
  Administered 2015-06-26 – 2015-06-27 (×5): 3 mL via RESPIRATORY_TRACT
  Filled 2015-06-25 (×5): qty 3

## 2015-06-25 MED ORDER — IPRATROPIUM-ALBUTEROL 0.5-2.5 (3) MG/3ML IN SOLN
3.0000 mL | Freq: Four times a day (QID) | RESPIRATORY_TRACT | Status: DC
  Administered 2015-06-25 (×3): 3 mL via RESPIRATORY_TRACT
  Filled 2015-06-25 (×3): qty 3

## 2015-06-25 MED ORDER — BUDESONIDE-FORMOTEROL FUMARATE 160-4.5 MCG/ACT IN AERO
2.0000 | INHALATION_SPRAY | Freq: Two times a day (BID) | RESPIRATORY_TRACT | Status: DC
  Administered 2015-06-25 – 2015-06-28 (×7): 2 via RESPIRATORY_TRACT
  Filled 2015-06-25: qty 0.1

## 2015-06-25 NOTE — Telephone Encounter (Signed)
Spoke with pt, wanted to make RB aware that she was in the hospital with the flu.  I advised that RB is off this week but that if she needs pulmonary to round on her she can request a consult from her nurse.  Pt expressed understanding.  Nothing further needed.

## 2015-06-25 NOTE — Telephone Encounter (Signed)
FYI Pt is calling to let md know she is hospitalize at [facility]

## 2015-06-25 NOTE — Progress Notes (Signed)
[facility] PROGRESS NOTE  Kim [name] [id number] DOB: 05/13/1955 DOA: 06/24/2015 PCP: [name], MD  Assessment/Plan: 1. Acute respiratory distress/hypoxia related to COPD exacerbation. Influenza A positive.  Patient saw PCP last week yields to improve in spite of prednisone taper azithromycin and inhaler. She is not on oxygen at home. She with a pH of 7.37 PCO2 38.7 PO2 63. Chest x-ray reveals hyperexpanded with mild bibasilar scarring no edema or consolidation.  -Continue oxygen supplementation -Wean oxygen as able -Solu-Medrol 60 mg every 12 hours -Azithromycin given fevers -Nebulizers every 2 hours PRN -start schedule nebulizer albuterol, ipratropium.  -Influenza A positive. Started on tamiflu.   2. COPD. chart review indicates pulmonology visit 6 months ago. Chart review indicates function testing shows that her FEV1 is dropped from 1.4 L2 1.0L over previous 5 years. That time her recommended continuation of Dulera and Spiriva and continuation of smoking cessation -Monitor oxygen saturation level closely  3. Solitary pulmonary nodule. Review indicates this was assessed 6 months ago by pulmonology. [facility] follow-up CT one year from that time  4. Tobacco abuse. Patient stopped smoking 15 months ago 5-Anxiety; continue with xanax  Code Status: Full Code.  Family Communication: husband at bedside.  Disposition Plan:    Consultants:  none  Procedures:  none  Antibiotics:  Azithromycin   HPI/Subjective: She is still having SOB. She wants to use her home dose symbicort.  She has coughing spell.   Objective: Filed Vitals:   06/25/15 0038 06/25/15 0617  BP: 104/58 127/66  Pulse: 89 80  Temp: 98.3 F (36.8 C) 98.5 F (36.9 C)  Resp: 16 16    Intake/Output Summary (Last 24 hours) at 06/25/15 0813 Last data filed at 06/25/15 0145  Gross per 24 hour  Intake 1167.5 ml  Output      0 ml  Net 1167.5 ml   Filed Weights   06/24/15 1609  06/25/15 0259  Weight: 55.339 kg (122 lb) 58.106 kg (128 lb 1.6 oz)    Exam:   General:  Mild anxious.   Cardiovascular: S 1, S 2 RRR  Respiratory: Bilateral wheezing expiratory   Abdomen: BS present, soft, nt  Musculoskeletal: no edema   Data Reviewed: Basic Metabolic Panel:  Recent Labs Lab 06/24/15 1043  NA 141  K 4.3  CL 105  CO2 25  GLUCOSE 98  BUN 16  CREATININE 0.77  CALCIUM 9.2   Liver Function Tests: No results for input(s): AST, ALT, ALKPHOS, BILITOT, PROT, ALBUMIN in the last 168 hours. No results for input(s): LIPASE, AMYLASE in the last 168 hours. No results for input(s): AMMONIA in the last 168 hours. CBC:  Recent Labs Lab 06/24/15 1043  WBC 12.4*  HGB 14.7  HCT 44.7  MCV 94.5  PLT 247   Cardiac Enzymes: No results for input(s): CKTOTAL, CKMB, CKMBINDEX, TROPONINI in the last 168 hours. BNP (last 3 results) No results for input(s): BNP in the last 8760 hours.  ProBNP (last 3 results) No results for input(s): PROBNP in the last 8760 hours.  CBG: No results for input(s): GLUCAP in the last 168 hours.  No results found for this or any previous visit (from the past 240 hour(s)).   Studies: Dg Chest 2 View  06/24/2015  CLINICAL DATA:  Shortness of breath and cough EXAM: CHEST  2 VIEW COMPARISON:  Chest radiograph December 29, 2012 and chest CT November 22, 2014 FINDINGS: Lungs remain hyperexpanded with slight bibasilar scarring. There is no edema or consolidation. Heart size  and pulmonary vascularity are within normal limits. No adenopathy. Small nodular opacities seen on prior chest CT are not appreciable by radiography. No bone lesions. IMPRESSION: Lungs hyperexpanded with mild bibasilar scarring. No edema or consolidation. Electronically Signed   By: Lowella Grip III M.D.   On: 06/24/2015 11:32    Scheduled Meds: . azithromycin  250 mg Oral Daily  . chlorpheniramine-HYDROcodone  5 mL Oral Q12H  . dextromethorphan-guaiFENesin  1  tablet Oral BID  . enoxaparin (LOVENOX) injection  40 mg Subcutaneous Q24H  . methylPREDNISolone (SOLU-MEDROL) injection  60 mg Intravenous Q12H  . mometasone-formoterol  2 puff Inhalation BID  . oseltamivir  75 mg Oral BID  . PARoxetine  25 mg Oral Daily  . pravastatin  20 mg Oral QPM  . sodium chloride flush  3 mL Intravenous Q12H  . tiotropium  18 mcg Inhalation QHS   Continuous Infusions:   Principal Problem:   Acute respiratory distress (HCC) Active Problems:   Dyslipidemia   COPD (chronic obstructive pulmonary disease) (HCC)   Solitary pulmonary nodule   COPD exacerbation ([place])    Time spent: 35 minutes.     Niel Hummer A  [facility] Pager [phone]. If 7PM-7AM, please contact night-coverage at [email or web address], password [facility] 06/25/2015, 8:13 AM

## 2015-06-25 NOTE — Progress Notes (Addendum)
Nutrition Brief Note  RD consulted to assess nutrition status/needs per COPD GOLD protocol.  Wt Readings from Last 15 Encounters:  06/25/15 128 lb 1.6 oz (58.106 kg)  06/15/15 122 lb (55.339 kg)  02/07/15 118 lb (53.524 kg)  12/20/14 119 lb (53.978 kg)  11/16/14 122 lb (55.339 kg)  09/21/14 128 lb (58.06 kg)  09/05/14 125 lb (56.7 kg)  08/21/14 128 lb (58.06 kg)  07/13/14 127 lb (57.607 kg)  04/24/14 127 lb (57.607 kg)  04/19/14 123 lb (55.792 kg)  04/05/14 123 lb (55.792 kg)  03/06/14 121 lb (54.885 kg)  12/08/13 111 lb (50.349 kg)  12/02/13 109 lb 9.6 oz (49.714 kg)   Kim [name] is a pleasant 60 y.o. female with a past medical history that includes COPD, depression, dyslipidemia, tobacco use quit smoking 15 months ago presents to emergency Department chief complaint worsening shortness of breath/cough/fever. Initial evaluation reveals acute respiratory distress likely related to COPD exacerbation.  Spoke with pt at bedside. She states she always has a "phenomenal" appetite. She generally consumes 2-3 meals per day. She shares with this RD that she recently started supplementing her diet with a Premier Protein shake daily, mainly using as a meal replacement. Wt has been stable; she reports UBE between 120-130#, which is consistent with wt hx. Pt shares that she has recently gained wt due to initiation of steroids.   Nutrition-Focused physical exam completed. Findings are no fat depletion, mild muscle depletion, and no edema.    Body mass index is 22.7 kg/(m^2). Patient meets criteria for normal weight range based on current BMI.   Current diet order is Heart Healthy, patient is consuming approximately 50-100% of meals at this time. Labs and medications reviewed.   No nutrition interventions warranted at this time. If nutrition issues arise, please consult RD.   [name] A. Jimmye Norman, RD, LDN, CDE Pager: [phone] After hours Pager: [phone]

## 2015-06-25 NOTE — Evaluation (Signed)
Occupational Therapy Evaluation Patient Details Name: DOLOREZ FRAGALE MRN: [id number] DOB: May 18, 1955 Today's Date: 06/25/2015    History of Present Illness AZORIA ABTS is a pleasant 60 y.o. female with a past medical history that includes COPD, depression, dyslipidemia, tobacco use quit smoking 15 months ago presents to emergency Department chief complaint worsening shortness of breath/cough/fever. Initial evaluation reveals acute respiratory distress likely related to COPD exacerbation.   Clinical Impression   Patient presenting with decreased ADL and functional mobility independence secondary to above. Patient independent PTA. Patient currently functioning at an overall supervision level, quickly reaching her independence. Patient will benefit from acute OT to increase overall independence in the areas of ADLs, functional mobility, continued education on energy conservation, and overall safety in order to safely discharge home with husband prn.   Pt found on 2.5L/min supplemental 02. RT present and decreased supplemental 02 to 1L/min. During functional mobility and activity, pt on RA and sats decreased to 80%. Pt took seated rest break and performed pursed lip breathing, sats increased to 89% on RA, therapist donned 1L/min supplemental 02 and sats increased to 94% quickly.     Follow Up Recommendations  No OT follow up;Supervision - Intermittent    Equipment Recommendations  Other (comment) (reacher)    Recommendations for Other Services  None at this time  Precautions / Restrictions Precautions Precautions: Fall Restrictions Weight Bearing Restrictions: No    Mobility Bed Mobility Overal bed mobility: Needs Assistance Bed Mobility: Supine to Sit;Sit to Supine     Supine to sit: Supervision Sit to supine: Supervision  Transfers Overall transfer level: Needs assistance Equipment used: None Transfers: Sit to/from Stand Sit to Stand: Supervision         General  transfer comment: supervision for safety, pt quickly approaching mod I to independent with this    Balance Overall balance assessment: Needs assistance Sitting-balance support: No upper extremity supported;Feet supported Sitting balance-Leahy Scale: Good     Standing balance support: No upper extremity supported;During functional activity Standing balance-Leahy Scale: Good    ADL Overall ADL's : Needs assistance/impaired General ADL Comments: Pt requires overall supervision for ADLs and functional mobility at this time. Pt quickly approaching mod I to independent level. Educated pt on energy conservation techniques and handout administerred.  Pt ambulated to/from BR for toilet transfer with increased time, supervision, and on RA. See above under Clinical Impression for more information on patient's sats during this activity/mobility.     Vision Additional Comments: no change from baseline          Pertinent Vitals/Pain Pain Assessment: No/denies pain ("just breathing pain")     Hand Dominance Right   Extremity/Trunk Assessment Upper Extremity Assessment Upper Extremity Assessment: Overall WFL for tasks assessed   Lower Extremity Assessment Lower Extremity Assessment: Defer to PT evaluation   Cervical / Trunk Assessment Cervical / Trunk Assessment: Normal   Communication Communication Communication: No difficulties   Cognition Arousal/Alertness: Awake/alert Behavior During Therapy: WFL for tasks assessed/performed Overall Cognitive Status: Within Functional Limits for tasks assessed               Home Living Family/patient expects to be discharged to:: Private residence Living Arrangements: Spouse/significant other Available Help at Discharge: Family;Available PRN/intermittently Type of Home: Apartment Home Access: Stairs to enter Entrance Stairs-Number of Steps: 14 Entrance Stairs-Rails: Left Home Layout: One level     Bathroom Shower/Tub: Community education officer: Standard     Home Equipment: Clinical cytogeneticist - 2  wheels;Cane - single point   Additional Comments: 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      Prior Functioning/Environment Level of Independence: Independent     OT Diagnosis: Generalized weakness   OT Problem List: Decreased activity tolerance;Impaired balance (sitting and/or standing);Decreased safety awareness;Decreased knowledge of use of DME or AE;Decreased knowledge of precautions;Cardiopulmonary status limiting activity   OT Treatment/Interventions: Self-care/ADL training;Therapeutic exercise;Energy conservation;DME and/or AE instruction;Therapeutic activities;Patient/family education;Balance training    OT Goals(Current goals can be found in the care plan section) Acute Rehab OT Goals Patient Stated Goal: get better OT Goal Formulation: With patient Time For Goal Achievement: 07/02/15 Potential to Achieve Goals: Good ADL Goals Pt Will Perform Grooming: Independently;standing Pt Will Transfer to Toilet: Independently;ambulating Additional ADL Goal #1: Pt will be able to verbalize at least 3 energy conservation techniques independently   OT Frequency: Min 2X/week   Barriers to D/C: none known at this time   End of Session Nurse Communication: Mobility status;Other (comment) (need for fan and supplemenal 02)  Activity Tolerance: Patient tolerated treatment well Patient left: in bed;with call bell/phone within reach   Time: [id number] OT Time Calculation (min): 27 min Charges:  OT General Charges $OT Visit: 1 Procedure OT Evaluation $OT Eval Moderate Complexity: 1 Procedure OT Treatments $Self Care/Home Management : 8-22 mins G-Codes: OT G-codes **NOT FOR INPATIENT CLASS** Functional Limitation: Self care Self Care Current Status [id number]): At least 1 percent but less than 20 percent impaired, limited or restricted (supervision) Self Care Goal Status [id number]): 0 percent impaired, limited  or restricted (independent )  Chrys Racer , MS, OTR/L, CLT Pager: [phone]  06/25/2015, 9:34 AM

## 2015-06-25 NOTE — Evaluation (Signed)
Physical Therapy Evaluation and Discharge  Patient Details Name: Kim [name] MRN: [id number] DOB: 05/03/1955 Today's Date: 06/25/2015   History of Present Illness  Kim [name] is a pleasant 60 y.o. female with a past medical history that includes COPD, depression, dyslipidemia, tobacco use quit smoking 15 months ago presents to emergency Department chief complaint worsening shortness of breath/cough/fever. Initial evaluation reveals acute respiratory distress likely related to COPD exacerbation. +flu    Clinical Impression  Patient evaluated by Physical Therapy with no further acute PT needs identified. All education has been completed (see below) and the patient has no further questions. Patient did drop her SaO2 to 80% on room air, and only required 1L O2 to maintain SaO2 91-93% while ambulating.PT is signing off. Thank you for this referral.     Follow Up Recommendations No PT follow up    Equipment Recommendations  None recommended by PT    Recommendations for Other Services       Precautions / Restrictions Precautions Precautions: Fall Precaution Comments: only due to new O2 tubing  Restrictions Weight Bearing Restrictions: No      Mobility  Bed Mobility Overal bed mobility: Needs Assistance Bed Mobility: Supine to Sit;Sit to Supine     Supine to sit: Supervision Sit to supine: Supervision   General bed mobility comments: sitting EOB on arrival  Transfers Overall transfer level: Independent Equipment used: None Transfers: Sit to/from Stand Sit to Stand: Independent         General transfer comment: supervision for safety, pt quickly approaching mod I to independent with this  Ambulation/Gait Ambulation/Gait assistance: Supervision Ambulation Distance (Feet): 250 Feet Assistive device: None Gait Pattern/deviations: WFL(Within Functional Limits)   Gait velocity interpretation: at or above normal speed for age/gender General Gait Details: stopped  for standing SaO2 reading x 2; vc for slower velocity  Stairs            Wheelchair Mobility    Modified Rankin (Stroke Patients Only)       Balance Overall balance assessment: Needs assistance Sitting-balance support: No upper extremity supported;Feet supported Sitting balance-Leahy Scale: Good     Standing balance support: No upper extremity supported;During functional activity Standing balance-Leahy Scale: Good                               Pertinent Vitals/Pain SaO2 93% on room air at rest SaO2 80% on room air with ambulation SaO2 91-93% on 1L O2 while walking **Provided patient with extended O2 tubing to allow pt to walk to bathroom wihtout removing her [place] O2  Educated on importance of pursed lip breathing and corrected her pursed-lip technique (with rationale provided)  Pain Assessment: No/denies pain    Home Living Family/patient expects to be discharged to:: Private residence Living Arrangements: Spouse/significant other Available Help at Discharge: Family;Available PRN/intermittently Type of Home: Apartment Home Access: Stairs to enter Entrance Stairs-Rails: Left Entrance Stairs-Number of Steps: 14 Home Layout: One level Home Equipment: Clinical cytogeneticist - 2 wheels;Cane - single point Additional Comments: 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    Prior Function Level of Independence: Independent               Hand Dominance   Dominant Hand: Right    Extremity/Trunk Assessment   Upper Extremity Assessment: Overall WFL for tasks assessed           Lower Extremity Assessment: Overall WFL for tasks assessed  Cervical / Trunk Assessment: Normal  Communication   Communication: No difficulties  Cognition Arousal/Alertness: Awake/alert Behavior During Therapy: WFL for tasks assessed/performed Overall Cognitive Status: Within Functional Limits for tasks assessed                      General Comments       Exercises        Assessment/Plan    PT Assessment Patent does not need any further PT services  PT Diagnosis Difficulty walking   PT Problem List    PT Treatment Interventions     PT Goals (Current goals can be found in the Care Plan section) Acute Rehab PT Goals Patient Stated Goal: get better PT Goal Formulation: All assessment and education complete, DC therapy    Frequency     Barriers to discharge        Co-evaluation               End of Session Equipment Utilized During Treatment: Oxygen Activity Tolerance: Patient tolerated treatment well Patient left: in bed;with call bell/phone within reach Nurse Communication: Mobility status;Precautions (SaO2 dropped on RA)    Functional Assessment Tool Used: clinical judgement Functional Limitation: Mobility: Walking and moving around Mobility: Walking and Moving Around Current Status [id number]): At least 1 percent but less than 20 percent impaired, limited or restricted Mobility: Walking and Moving Around Goal Status [phone]): At least 1 percent but less than 20 percent impaired, limited or restricted Mobility: Walking and Moving Around Discharge Status [phone]): At least 1 percent but less than 20 percent impaired, limited or restricted    Time: 1002-1026 PT Time Calculation (min) (ACUTE ONLY): 24 min   Charges:   PT Evaluation $PT Eval Low Complexity: 1 Procedure PT Treatments $Gait Training: 8-22 mins   PT G Codes:   PT G-Codes **NOT FOR INPATIENT CLASS** Functional Assessment Tool Used: clinical judgement Functional Limitation: Mobility: Walking and moving around Mobility: Walking and Moving Around Current Status [id number]): At least 1 percent but less than 20 percent impaired, limited or restricted Mobility: Walking and Moving Around Goal Status [phone]): At least 1 percent but less than 20 percent impaired, limited or restricted Mobility: Walking and Moving Around Discharge Status [phone]): At least 1 percent but  less than 20 percent impaired, limited or restricted    Natacha Jepsen 06/25/2015, 10:37 AM Pager [phone]

## 2015-06-25 NOTE — Progress Notes (Signed)
SATURATION QUALIFICATIONS: (This note is used to comply with regulatory documentation for home oxygen)  Patient Saturations on Room Air at Rest = 93%  Patient Saturations on Room Air while Ambulating = 80%  Patient Saturations on 1 Liters of oxygen while Ambulating = 92%  Please briefly explain why patient needs home oxygen: With decr saturation, pt becomes very SOB, shakey, and lightheaded with incr fall risk   06/25/2015 Barry Brunner, PT Pager: [phone]

## 2015-06-26 MED ORDER — PREDNISONE 50 MG PO TABS
60.0000 mg | ORAL_TABLET | Freq: Every day | ORAL | Status: DC
  Administered 2015-06-27 – 2015-06-28 (×2): 60 mg via ORAL
  Filled 2015-06-26 (×2): qty 1

## 2015-06-26 NOTE — Progress Notes (Signed)
[facility] PROGRESS NOTE  Kim [name] [id number] DOB: 03-03-1956 DOA: 06/24/2015 PCP: [name], MD  Assessment/Plan: 1. Acute respiratory distress/hypoxia related to COPD exacerbation. Influenza A positive.  Patient saw PCP last week yields to improve in spite of prednisone taper azithromycin and inhaler. She is not on oxygen at home. She with a pH of 7.37 PCO2 38.7 PO2 63. Chest x-ray reveals hyperexpanded with mild bibasilar scarring no edema or consolidation.  -Continue oxygen supplementation -Wean oxygen as able -Solu-Medrol 60 mg every 12 hours-- change to prednisone daily.  -Azithromycin given fevers -Nebulizers every 2 hours PRN -start schedule nebulizer albuterol, ipratropium.  -Influenza A positive. Started on tamiflu.  Observed on oral prednisone.  Feeling better. Dyspnea improving. Not at baseline yet.   2. COPD. chart review indicates pulmonology visit 6 months ago. Chart review indicates function testing shows that her FEV1 is dropped from 1.4 L2 1.0L over previous 5 years. That time her recommended continuation of Dulera and Spiriva and continuation of smoking cessation -Monitor oxygen saturation level closely  3. Solitary pulmonary nodule. Review indicates this was assessed 6 months ago by pulmonology. [place] follow-up CT one year from that time  4. Tobacco abuse. Patient stopped smoking 15 months ago 5-Anxiety; continue with xanax  Code Status: Full Code.  Family Communication: husband at bedside.  Disposition Plan: home in 24 hours if respiratory status improved    Consultants:  none  Procedures:  none  Antibiotics:  Azithromycin   HPI/Subjective: She is feeling much better, dyspnea improved. Still with coughing spell but cough medications help/   Objective: Filed Vitals:   06/26/15 0701 06/26/15 1422  BP: 139/59 132/63  Pulse: 76 95  Temp: 98.1 F (36.7 C) 98.4 F (36.9 C)  Resp: 18 18    Intake/Output Summary  (Last 24 hours) at 06/26/15 1427 Last data filed at 06/26/15 1000  Gross per 24 hour  Intake    480 ml  Output      0 ml  Net    480 ml   Filed Weights   06/24/15 1609 06/25/15 0259 06/26/15 0647  Weight: 55.339 kg (122 lb) 58.106 kg (128 lb 1.6 oz) 58.06 kg (128 lb)    Exam:   General:  Mild anxious.   Cardiovascular: S 1, S 2 RRR  Respiratory: no wheezing, bilateral ronchus   Abdomen: BS present, soft, nt  Musculoskeletal: no edema   Data Reviewed: Basic Metabolic Panel:  Recent Labs Lab 06/24/15 1043 06/25/15 0704  NA 141 142  K 4.3 4.8  CL 105 111  CO2 25 24  GLUCOSE 98 161*  BUN 16 16  CREATININE 0.77 0.65  CALCIUM 9.2 8.7*   Liver Function Tests: No results for input(s): AST, ALT, ALKPHOS, BILITOT, PROT, ALBUMIN in the last 168 hours. No results for input(s): LIPASE, AMYLASE in the last 168 hours. No results for input(s): AMMONIA in the last 168 hours. CBC:  Recent Labs Lab 06/24/15 1043 06/25/15 0704  WBC 12.4* 12.9*  HGB 14.7 12.7  HCT 44.7 39.7  MCV 94.5 96.8  PLT 247 217   Cardiac Enzymes: No results for input(s): CKTOTAL, CKMB, CKMBINDEX, TROPONINI in the last 168 hours. BNP (last 3 results) No results for input(s): BNP in the last 8760 hours.  ProBNP (last 3 results) No results for input(s): PROBNP in the last 8760 hours.  CBG: No results for input(s): GLUCAP in the last 168 hours.  No results found for this or any previous visit (from the past  240 hour(s)).   Studies: No results found.  Scheduled Meds: . azithromycin  250 mg Oral Daily  . budesonide-formoterol  2 puff Inhalation BID  . chlorpheniramine-HYDROcodone  5 mL Oral Q12H  . dextromethorphan-guaiFENesin  1 tablet Oral BID  . enoxaparin (LOVENOX) injection  40 mg Subcutaneous Q24H  . ipratropium-albuterol  3 mL Nebulization QID  . oseltamivir  75 mg Oral BID  . PARoxetine  25 mg Oral Daily  . pravastatin  20 mg Oral QPM  . predniSONE  60 mg Oral Q breakfast  .  sodium chloride flush  3 mL Intravenous Q12H   Continuous Infusions:   Principal Problem:   Acute respiratory distress (HCC) Active Problems:   Dyslipidemia   COPD (chronic obstructive pulmonary disease) (HCC)   Solitary pulmonary nodule   COPD exacerbation ([place])    Time spent: 35 minutes.     Niel Hummer A  [facility] Pager [phone]. If 7PM-7AM, please contact night-coverage at [email or web address], password [facility] 06/26/2015, 2:27 PM  LOS: 1 day

## 2015-06-26 NOTE — Progress Notes (Signed)
Occupational Therapy Treatment and Discharge Patient Details Name: Kim [name] MRN: [id number] DOB: Jul 10, 1955 Today's Date: 06/26/2015    History of present illness Kim [name] is a pleasant 60 y.o. female with a past medical history that includes COPD, depression, dyslipidemia, tobacco use quit smoking 15 months ago presents to emergency Department chief complaint worsening shortness of breath/cough/fever. Initial evaluation reveals acute respiratory distress likely related to COPD exacerbation. +flu   OT comments  Pt is performing ADL and mobility at an independent to modified independent level.  02 sats on room air with toileting and grooming remained at 91%. Pt has been educated in energy conservation strategies. No further OT needs.  Follow Up Recommendations  No OT follow up;Supervision - Intermittent    Equipment Recommendations  None recommended by OT    Recommendations for Other Services      Precautions / Restrictions Precautions Precautions: None       Mobility Bed Mobility Overal bed mobility: Independent                Transfers Overall transfer level: Independent Equipment used: None                  Balance     Sitting balance-Leahy Scale: Normal       Standing balance-Leahy Scale: Good                     ADL Overall ADL's : Modified independent                                       General ADL Comments: Pt is able to state 3 energy conservation strategies and demonstrated independence in toileting and standing grooming. O2 sats at 91% on RA.      Vision                     Perception     Praxis      Cognition   Behavior During Therapy: WFL for tasks assessed/performed Overall Cognitive Status: Within Functional Limits for tasks assessed                       Extremity/Trunk Assessment               Exercises     Shoulder Instructions       General Comments       Pertinent Vitals/ Pain       Pain Assessment: No/denies pain  Home Living                                          Prior Functioning/Environment              Frequency       Progress Toward Goals  OT Goals(current goals can now be found in the care plan section)  Progress towards OT goals: Goals met/education completed, patient discharged from [place] Discharge plan remains appropriate    Co-evaluation                 End of Session     Activity Tolerance Patient tolerated treatment well   Patient Left in bed;with call bell/phone within reach;with family/visitor present   Nurse Communication  Time: 3354-5625 OT Time Calculation (min): 21 min  Charges: OT General Charges $OT Visit: 1 Procedure OT Treatments $Self Care/Home Management : 8-22 mins  Malka So 06/26/2015, 1:58 PM  [phone]

## 2015-06-26 NOTE — Care Management Note (Addendum)
Case Management Note  Patient Details  Name: LACONIA LAGASSE MRN: [id number] Date of Birth: 1956/04/05  Subjective/Objective:                 Spoke with patient at bedside. She states she goes to Dr Lamonte Sakai for pulmonology. She denies any difficulties getting or paying for her medication. She denies any difficulties scheduling or getting to appointments. She states she still drives. She mentioned that she may like o take her inhalers via nebulizer, and she does not have one. CM advised patient that having/ using a nebulizer at home would not be a substitute for being seen by an MD when she was having a COPD flare. However if her and her doctor agree that it would benefit her to use a nebulizer more than inhalers that CM would be happy to get her a nebulizer prior to discharge. She verbalized understanding. Patient does not have home oxygen requirement prior to admission.    Action/Plan:  Patient may need nebulizer. Will continue to follow Expected Discharge Date:                  Expected Discharge Plan:  Home/Self Care  In-House Referral:     Discharge planning Services  CM Consult  Post Acute Care Choice:    Choice offered to:     DME Arranged:    DME Agency:     HH Arranged:    HH Agency:     Status of Service:  In process, will continue to follow  Medicare Important Message Given:    Date Medicare IM Given:    Medicare IM give by:    Date Additional Medicare IM Given:    Additional Medicare Important Message give by:     If discussed at [place] of Stay Meetings, dates discussed:    Additional Comments:  Carles Collet, RN 06/26/2015, 12:27 PM

## 2015-06-27 DIAGNOSIS — E785 Hyperlipidemia, unspecified: Secondary | ICD-10-CM

## 2015-06-27 DIAGNOSIS — J418 Mixed simple and mucopurulent chronic bronchitis: Secondary | ICD-10-CM

## 2015-06-27 DIAGNOSIS — R0600 Dyspnea, unspecified: Secondary | ICD-10-CM

## 2015-06-27 DIAGNOSIS — R911 Solitary pulmonary nodule: Secondary | ICD-10-CM

## 2015-06-27 DIAGNOSIS — R0902 Hypoxemia: Secondary | ICD-10-CM

## 2015-06-27 MED ORDER — IPRATROPIUM-ALBUTEROL 0.5-2.5 (3) MG/3ML IN SOLN
3.0000 mL | Freq: Three times a day (TID) | RESPIRATORY_TRACT | Status: DC
Start: 2015-06-27 — End: 2015-06-28
  Administered 2015-06-27 – 2015-06-28 (×3): 3 mL via RESPIRATORY_TRACT
  Filled 2015-06-27 (×4): qty 3

## 2015-06-27 NOTE — Care Management Note (Signed)
Case Management Note  Patient Details  Name: SALAMA STUVER MRN: [id number] Date of Birth: 03-23-1956  Subjective/Objective:                 Spoke with patient at bedside. She states she goes to Dr Lamonte Sakai for pulmonology. She denies any difficulties getting or paying for her medication. She denies any difficulties scheduling or getting to appointments. She states she still drives. She mentioned that she may like o take her inhalers via nebulizer, and she does not have one. CM advised patient that having/ using a nebulizer at home would not be a substitute for being seen by an MD when she was having a COPD flare. However if her and her doctor agree that it would benefit her to use a nebulizer more than inhalers that CM would be happy to get her a nebulizer prior to discharge. She verbalized understanding. Patient does not have home oxygen requirement prior to admission.    Action/Plan:  Patient declined [place] aide and RN. Nebulizer to be delivered to room prior to discharge by [facility]  Expected Discharge Date:                  Expected Discharge Plan:  Home/Self Care  In-House Referral:     Discharge planning Services  CM Consult  Post Acute Care Choice:  Durable Medical Equipment Choice offered to:  Patient  DME Arranged:  Nebulizer machine DME Agency:  [place] Arranged:   (declined) [place] Agency:     Status of Service:  Completed, signed off  Medicare Important Message Given:    Date Medicare IM Given:    Medicare IM give by:    Date Additional Medicare IM Given:    Additional Medicare Important Message give by:     If discussed at [place] of Stay Meetings, dates discussed:    Additional Comments:  Carles Collet, RN 06/27/2015, 2:56 PM

## 2015-06-27 NOTE — Progress Notes (Addendum)
[facility] PROGRESS NOTE  Kim [name] [id number] DOB: 1955-10-25 DOA: 06/24/2015 PCP: [name], MD  Assessment/Plan: 1. Acute respiratory distress/hypoxia related to COPD exacerbation. Influenza A positive.  Patient saw PCP last week yields to improve in spite of prednisone taper azithromycin and inhaler. She is not on oxygen at home. She with a pH of 7.37 PCO2 38.7 PO2 63. Chest x-ray reveals hyperexpanded with mild bibasilar scarring no edema or consolidation.  -Continue oxygen supplementation -Wean oxygen as able -Solu-Medrol 60 mg every 12 hours-- changed to prednisone daily.  -Azithromycin given fevers,   -Nebulizers every 2 hours PRN -start schedule nebulizer albuterol, ipratropium.  -Influenza A positive. Cont  on tamiflu. Day 3 Observed on oral prednisone.  Feeling better. Dyspnea improving. Not at baseline yet.   2. COPD. chart review indicates pulmonology visit 6 months ago. Chart review indicates function testing shows that her FEV1 is dropped from 1.4 L2 1.0L over previous 5 years. That time her recommended continuation of Dulera and Spiriva and continuation of smoking cessation -Monitor oxygen saturation level closely  3. Solitary pulmonary nodule. Review indicates this was assessed 6 months ago by pulmonology. [place] follow-up CT one year from that time  4. Tobacco abuse. Patient stopped smoking 15 months ago  5-Anxiety; continue with xanax  Code Status: Full Code.  Family Communication: husband at bedside.  Disposition Plan: home in 24 hours if respiratory status improved    Consultants:  none  Procedures:  none  Antibiotics:  Azithromycin   HPI/Subjective: She is feeling much better, still has a cough , dyspnea on exertion .   Objective: Filed Vitals:   06/26/15 2238 06/27/15 0633  BP: 119/56 154/85  Pulse: 95 75  Temp: 98 F (36.7 C) 98 F (36.7 C)  Resp: 16 15    Intake/Output Summary (Last 24 hours) at  06/27/15 1016 Last data filed at 06/26/15 1907  Gross per 24 hour  Intake    600 ml  Output      0 ml  Net    600 ml   Filed Weights   06/25/15 0259 06/26/15 0647 06/27/15 0630  Weight: 58.106 kg (128 lb 1.6 oz) 58.06 kg (128 lb) 57.5 kg (126 lb 12.2 oz)    Exam:   General:  Mild anxious.   Cardiovascular: S 1, S 2 RRR  Respiratory: no wheezing, bilateral ronchus   Abdomen: BS present, soft, nt  Musculoskeletal: no edema   Data Reviewed: Basic Metabolic Panel:  Recent Labs Lab 06/24/15 1043 06/25/15 0704  NA 141 142  K 4.3 4.8  CL 105 111  CO2 25 24  GLUCOSE 98 161*  BUN 16 16  CREATININE 0.77 0.65  CALCIUM 9.2 8.7*   Liver Function Tests: No results for input(s): AST, ALT, ALKPHOS, BILITOT, PROT, ALBUMIN in the last 168 hours. No results for input(s): LIPASE, AMYLASE in the last 168 hours. No results for input(s): AMMONIA in the last 168 hours. CBC:  Recent Labs Lab 06/24/15 1043 06/25/15 0704  WBC 12.4* 12.9*  HGB 14.7 12.7  HCT 44.7 39.7  MCV 94.5 96.8  PLT 247 217   Cardiac Enzymes: No results for input(s): CKTOTAL, CKMB, CKMBINDEX, TROPONINI in the last 168 hours. BNP (last 3 results) No results for input(s): BNP in the last 8760 hours.  ProBNP (last 3 results) No results for input(s): PROBNP in the last 8760 hours.  CBG: No results for input(s): GLUCAP in the last 168 hours.  No results found for this or  any previous visit (from the past 240 hour(s)).   Studies: No results found.  Scheduled Meds: . azithromycin  250 mg Oral Daily  . budesonide-formoterol  2 puff Inhalation BID  . chlorpheniramine-HYDROcodone  5 mL Oral Q12H  . dextromethorphan-guaiFENesin  1 tablet Oral BID  . enoxaparin (LOVENOX) injection  40 mg Subcutaneous Q24H  . ipratropium-albuterol  3 mL Nebulization TID  . oseltamivir  75 mg Oral BID  . PARoxetine  25 mg Oral Daily  . pravastatin  20 mg Oral QPM  . predniSONE  60 mg Oral Q breakfast  . sodium chloride  flush  3 mL Intravenous Q12H   Continuous Infusions:   Principal Problem:   Acute respiratory distress (HCC) Active Problems:   Dyslipidemia   COPD (chronic obstructive pulmonary disease) (HCC)   Solitary pulmonary nodule   COPD exacerbation ([place])    Time spent: 35 minutes.     [place] [facility] Pager [phone]. If 7PM-7AM, please contact night-coverage at [email or web address], password [email or web address] 06/27/2015, 10:16 AM  LOS: 2 days

## 2015-06-27 NOTE — Progress Notes (Signed)
SATURATION QUALIFICATIONS: (This note is used to comply with regulatory documentation for home oxygen)  Patient Saturations on Room Air at Rest = 96%  Patient Saturations on Room Air while Ambulating = 94%  Patient Saturations on 1  Liters of oxygen while Ambulating = 97%  Please briefly explain why patient needs home oxygen: no needs at this time

## 2015-06-28 DIAGNOSIS — J42 Unspecified chronic bronchitis: Secondary | ICD-10-CM

## 2015-06-28 DIAGNOSIS — J441 Chronic obstructive pulmonary disease with (acute) exacerbation: Principal | ICD-10-CM

## 2015-06-28 MED ORDER — ALBUTEROL SULFATE (2.5 MG/3ML) 0.083% IN NEBU: 2.5000 mg | INHALATION_SOLUTION | RESPIRATORY_TRACT | Status: DC | PRN

## 2015-06-28 MED ORDER — BUDESONIDE-FORMOTEROL FUMARATE 160-4.5 MCG/ACT IN AERO: 2.0000 | INHALATION_SPRAY | Freq: Two times a day (BID) | RESPIRATORY_TRACT | Status: DC

## 2015-06-28 MED ORDER — HYDROCOD POLST-CPM POLST ER 10-8 MG/5ML PO SUER: 5.0000 mL | Freq: Two times a day (BID) | ORAL | Status: DC

## 2015-06-28 MED ORDER — OSELTAMIVIR PHOSPHATE 75 MG PO CAPS: 75.0000 mg | ORAL_CAPSULE | Freq: Two times a day (BID) | ORAL | Status: DC

## 2015-06-28 MED ORDER — PREDNISONE 20 MG PO TABS: ORAL_TABLET | ORAL | Status: DC

## 2015-06-28 NOTE — Progress Notes (Signed)
Nsg Discharge Note  Admit Date:  06/24/2015 Discharge date: 06/28/2015   Kim [name] to be D/C'd Home per MD order.  AVS completed.  Copy for chart, and copy for patient signed, and dated. Patient/caregiver able to verbalize understanding.  Discharge Medication:   Medication List    STOP taking these medications        azithromycin 250 MG tablet  Commonly known as:  ZITHROMAX      TAKE these medications        albuterol 108 (90 Base) MCG/ACT inhaler  Commonly known as:  PROVENTIL HFA;VENTOLIN HFA  Inhale 2 puffs into the lungs every 4 (four) hours as needed for wheezing or shortness of breath.     albuterol (2.5 MG/3ML) 0.083% nebulizer solution  Commonly known as:  PROVENTIL  Take 3 mLs (2.5 mg total) by nebulization every 2 (two) hours as needed for wheezing.     ALPRAZolam 0.5 MG tablet  Commonly known as:  XANAX  TAKE 1/2 TO 1 TABLET BY MOUTH 3 TIMES A DAY AS NEEDED     budesonide-formoterol 160-4.5 MCG/ACT inhaler  Commonly known as:  SYMBICORT  Inhale 2 puffs into the lungs 2 (two) times daily.     chlorpheniramine-HYDROcodone 10-8 MG/5ML Suer  Commonly known as:  TUSSIONEX  Take 5 mLs by mouth every 12 (twelve) hours.     HYDROcodone-acetaminophen 7.5-325 MG tablet  Commonly known as:  NORCO  Take 1 tablet by mouth every 6 (six) hours as needed for moderate pain.     MUCINEX DM MAXIMUM STRENGTH 60-1200 MG Tb12  Take 1,200 mg by mouth 2 (two) times daily.     nystatin 100000 UNIT/ML suspension  Commonly known as:  MYCOSTATIN  Take 5 mLs (500,000 Units total) by mouth 4 (four) times daily.     oseltamivir 75 MG capsule  Commonly known as:  TAMIFLU  Take 1 capsule (75 mg total) by mouth 2 (two) times daily.     PAXIL CR 25 MG 24 hr tablet  Generic drug:  PARoxetine  Take 1 tablet by mouth  every morning     pravastatin 20 MG tablet  Commonly known as:  PRAVACHOL  Take 1 tablet (20 mg total) by mouth every evening.     predniSONE 20 MG tablet   Commonly known as:  DELTASONE  3 tablets 3 days, 2 tablets3 days, 1.5 tablets 3 days, 1 tablet 3 days, half a tablet 3 days, then discontinue     SPIRIVA HANDIHALER 18 MCG inhalation capsule  Generic drug:  tiotropium  Inhale contents of 1 capsule daily     Vitamin D3 1000 units Caps  Take 1 capsule by mouth 2 (two) times daily.        Discharge Assessment: Filed Vitals:   06/27/15 2201 06/28/15 0524  BP: 148/65 142/72  Pulse: 68 72  Temp: 98 F (36.7 C) 97.7 F (36.5 C)  Resp: 15 16   Skin clean, dry and intact without evidence of skin break down, no evidence of skin tears noted. IV catheter discontinued intact. Site without signs and symptoms of complications - no redness or edema noted at insertion site, patient denies c/o pain - only slight tenderness at site.  Dressing with slight pressure applied.  D/c Instructions-Education: Discharge instructions given to patient/family with verbalized understanding. D/c education completed with patient/family including follow up instructions, medication list, d/c activities limitations if indicated, with other d/c instructions as indicated by MD - patient able to verbalize understanding, all  questions fully answered. Patient instructed to return to ED, call 911, or call MD for any changes in condition.  Patient escorted via [place], and D/C home via private auto.  Dayle Points, RN 06/28/2015 1:47 PM

## 2015-06-28 NOTE — Progress Notes (Signed)
PT Cancellation Note  Patient Details Name: Kim [name] MRN: [id number] DOB: 11/10/1955   Cancelled Treatment:    Reason Eval/Treat Not Completed: PT screened, no needs identified, will sign off. PT evaluation performed 06/25/15-please see eval. Signing off.    Weston Anna, MPT Pager: [phone]

## 2015-06-28 NOTE — Discharge Summary (Signed)
Physician Discharge Summary  ALEE ROTHKOPF MRN: [id number] DOB/AGE: July 26, 1955 60 y.o.  PCP: [name], MD   Admit date: 06/24/2015 Discharge date: 06/28/2015  Discharge Diagnoses:   Principal Problem:   Acute respiratory distress [facility]) Active Problems:   Dyslipidemia   COPD (chronic obstructive pulmonary disease) (HCC)   Solitary pulmonary nodule   COPD exacerbation (HCC)    Follow-up recommendations Follow-up with PCP in 3-5 days , including all  additional recommended appointments as below Follow-up CBC, CMP in 3-5 days Patient needs to follow-up with pulmonary, for evaluation of pulmonary nodules seen on CT scan on 11/22/14  This is to be arranged for by PCP     Medication List    STOP taking these medications        azithromycin 250 MG tablet  Commonly known as:  ZITHROMAX      TAKE these medications        albuterol 108 (90 Base) MCG/ACT inhaler  Commonly known as:  PROVENTIL HFA;VENTOLIN HFA  Inhale 2 puffs into the lungs every 4 (four) hours as needed for wheezing or shortness of breath.     albuterol (2.5 MG/3ML) 0.083% nebulizer solution  Commonly known as:  PROVENTIL  Take 3 mLs (2.5 mg total) by nebulization every 2 (two) hours as needed for wheezing.     ALPRAZolam 0.5 MG tablet  Commonly known as:  XANAX  TAKE 1/2 TO 1 TABLET BY MOUTH 3 TIMES A DAY AS NEEDED     budesonide-formoterol 160-4.5 MCG/ACT inhaler  Commonly known as:  SYMBICORT  Inhale 2 puffs into the lungs 2 (two) times daily.     chlorpheniramine-HYDROcodone 10-8 MG/5ML Suer  Commonly known as:  TUSSIONEX  Take 5 mLs by mouth every 12 (twelve) hours.     HYDROcodone-acetaminophen 7.5-325 MG tablet  Commonly known as:  NORCO  Take 1 tablet by mouth every 6 (six) hours as needed for moderate pain.     MUCINEX DM MAXIMUM STRENGTH 60-1200 MG Tb12  Take 1,200 mg by mouth 2 (two) times daily.     nystatin 100000 UNIT/ML suspension  Commonly known as:  MYCOSTATIN   Take 5 mLs (500,000 Units total) by mouth 4 (four) times daily.     oseltamivir 75 MG capsule  Commonly known as:  TAMIFLU  Take 1 capsule (75 mg total) by mouth 2 (two) times daily.     PAXIL CR 25 MG 24 hr tablet  Generic drug:  PARoxetine  Take 1 tablet by mouth  every morning     pravastatin 20 MG tablet  Commonly known as:  PRAVACHOL  Take 1 tablet (20 mg total) by mouth every evening.     predniSONE 20 MG tablet  Commonly known as:  DELTASONE  3 tablets 3 days, 2 tablets3 days, 1.5 tablets 3 days, 1 tablet 3 days, half a tablet 3 days, then discontinue     SPIRIVA HANDIHALER 18 MCG inhalation capsule  Generic drug:  tiotropium  Inhale contents of 1 capsule daily     Vitamin D3 1000 units Caps  Take 1 capsule by mouth 2 (two) times daily.         Discharge Condition: Stable  Discharge Instructions Get Medicines reviewed and adjusted: Please take all your medications with you for your next visit with your Primary MD  Please request your Primary MD to go over all hospital tests and procedure/radiological results at the follow up, please ask your Primary MD to get all Hospital records sent to  his/her office.  If you experience worsening of your admission symptoms, develop shortness of breath, life threatening emergency, suicidal or homicidal thoughts you must seek medical attention immediately by calling 911 or calling your MD immediately if symptoms less severe.  You must read complete instructions/literature along with all the possible adverse reactions/side effects for all the Medicines you take and that have been prescribed to you. Take any new Medicines after you have completely understood and accpet all the possible adverse reactions/side effects.   Do not drive when taking Pain medications.   Do not take more than prescribed Pain, Sleep and Anxiety Medications  Special Instructions: If you have smoked or chewed Tobacco in the last 2 yrs please stop  smoking, stop any regular Alcohol and or any Recreational drug use.  Wear Seat belts while driving.  Please note  You were cared for by a hospitalist during your hospital stay. Once you are discharged, your primary care physician will handle any further medical issues. Please note that NO REFILLS for any discharge medications will be authorized once you are discharged, as it is imperative that you return to your primary care physician (or establish a relationship with a primary care physician if you do not have one) for your aftercare needs so that they can reassess your need for medications and monitor your lab values.      Discharge Instructions    Diet - low sodium heart healthy    Complete by:  As directed      Increase activity slowly    Complete by:  As directed            Allergies  Allergen Reactions  . Penicillins Hives    Has patient had a PCN reaction causing immediate rash, facial/tongue/throat swelling, SOB or lightheadedness with hypotension: Yes Has patient had a PCN reaction causing severe rash involving mucus membranes or skin necrosis: No Has patient had a PCN reaction that required hospitalization No Has patient had a PCN reaction occurring within the last 10 years: No If all of the above answers are "NO", then may proceed with Cephalosporin use.       Disposition:    Consults:      Significant Diagnostic Studies:  Dg Chest 2 View  06/24/2015  CLINICAL DATA:  Shortness of breath and cough EXAM: CHEST  2 VIEW COMPARISON:  Chest radiograph December 29, 2012 and chest CT November 22, 2014 FINDINGS: Lungs remain hyperexpanded with slight bibasilar scarring. There is no edema or consolidation. Heart size and pulmonary vascularity are within normal limits. No adenopathy. Small nodular opacities seen on prior chest CT are not appreciable by radiography. No bone lesions. IMPRESSION: Lungs hyperexpanded with mild bibasilar scarring. No edema or consolidation.  Electronically Signed   By: Lowella Grip III M.D.   On: 06/24/2015 11:32       Filed Weights   06/26/15 0647 06/27/15 0630 06/28/15 0524  Weight: 58.06 kg (128 lb) 57.5 kg (126 lb 12.2 oz) 57.7 kg (127 lb 3.3 oz)      Lipid Panel     Component Value Date/Time   CHOL 207* 01/30/2015 0925   TRIG 114.0 01/30/2015 0925   HDL 41.20 01/30/2015 0925   CHOLHDL 5 01/30/2015 0925   VLDL 22.8 01/30/2015 0925   LDLCALC 143* 01/30/2015 0925   LDLDIRECT 155.4 12/29/2012 1209     No results found for: HGBA1C   Lab Results  Component Value Date   LDLCALC 143* 01/30/2015   CREATININE 0.65  06/25/2015     HPI :60 y.o. female with a past medical history that includes COPD, depression, dyslipidemia, tobacco use quit smoking 15 months ago presents to emergency Department chief complaint worsening shortness of breath/cough/fever. Initial evaluation reveals acute respiratory distress likely related to COPD exacerbation.  Information is obtained from the patient who reports worsening shortness of breath increased cough and increased sputum production over the last week. She saw her PCP March 10 and was given azithromycin a prednisone taper and inhaler. Reports compliance continued to worsen. Associated symptoms include intermittent chills and last night she had a fever of 100.1. Addition she complains of chest "tightness" with coughing. She denies any palpitations headache dizziness syncope or near-syncope. She denies any lower extremity edema or orthopnea. She does not require oxygen at home. She denies any abdominal pain nausea vomiting diarrhea constipation. No dysuria hematuria frequency or urgency.  In the emergency department she is a temperature of 99.6 tachypnea at the rate of 27, oxygen saturation level is 90 on 2 L she is tachycardic with stable blood pressure. Provided with iv fluids Solu-Medrol respiratory status much improved on admission  HOSPITAL COURSE:  1. Acute respiratory  distress/hypoxia related to COPD exacerbation. Influenza A positive.  Patient saw PCP last week yields to improve in spite of prednisone taper azithromycin and inhaler. She is not on oxygen at home. She with a pH of 7.37 PCO2 38.7 PO2 63. Chest x-ray reveals hyperexpanded with mild bibasilar scarring no edema or consolidation.   Initially required 2 L of oxygen, no Oxygen requirements at the time of discharge Initially given-Solu-Medrol 60 mg every 12 hours-- changed to prednisone daily. Continue taper -Azithromycin given fevers, Completed -Nebulizers every 2 hours PRN -start schedule nebulizer albuterol, ipratropium. Patient provided with a nebulizer device prior to discharge -Influenza A positive. Treated with Tamiflu, continue for another 4 days Observed on oral prednisone.  Feeling better. Dyspnea improving. Not at baseline yet.   2. COPD. chart review indicates pulmonology visit 6 months ago. Chart review indicates function testing shows that her FEV1 is dropped from 1.4 L2 1.0L over previous 5 years. That time her recommended continuation of Dulera and Spiriva and continuation of smoking cessation Patient would benefit from outpatient pulmonary evaluation  3. Solitary pulmonary nodule. Review indicates this was assessed 6 months ago by pulmonology. [facility] follow-up CT one year from that time  4. Tobacco abuse. Patient stopped smoking 15 months ago  5-Anxiety; continue with xanax   Discharge Exam:    Blood pressure 142/72, pulse 72, temperature 97.7 F (36.5 C), temperature source Oral, resp. rate 16, height 5\' 3"  (1.6 m), weight 57.7 kg (127 lb 3.3 oz), SpO2 93 %.    General: Appears calm and comfortable, lying on her side  Eyes: PERRL, normal lids, irises & conjunctiva  ENT: grossly normal hearing, lips & tongue  Neck: no LAD, masses or thyromegaly  Cardiovascular: RRR, no m/r/g. No LE edema.  Respiratory: Normal effort with conversation. Breath sounds quite  diminished throughout hear faint end expiratory wheezing throughout no crackles  Abdomen: soft, ntnd no guarding or rebounding  Skin: no rash or induration seen on limited exam  Musculoskeletal: grossly normal tone BUE/BLE  Psychiatric: grossly normal mood and affect, speech fluent and appropriate  Neurologic: grossly non-focal. Speech clear facial symmetry    Follow-up Information    Follow up with [facility].   Why:  Nebulizer to be delivered to room prior to DC   Contact information:  [address] [place] [address] [phone]       Follow up with [name], MD. Schedule an appointment as soon as possible for a visit in 3 days.   Specialty:  Internal Medicine   Contact information:   [place] [address] [phone]       Signed: Reyne Dumas 06/28/2015, 12:11 PM        Time spent >45 mins

## 2015-07-02 ENCOUNTER — Ambulatory Visit (INDEPENDENT_AMBULATORY_CARE_PROVIDER_SITE_OTHER): Payer: 59 | Admitting: Internal Medicine

## 2015-07-02 ENCOUNTER — Encounter: Payer: Self-pay | Admitting: Internal Medicine

## 2015-07-02 VITALS — BP 160/78 | HR 72 | Temp 98.0°F | Resp 20 | Ht 63.0 in | Wt 127.0 lb

## 2015-07-02 DIAGNOSIS — J441 Chronic obstructive pulmonary disease with (acute) exacerbation: Secondary | ICD-10-CM | POA: Diagnosis not present

## 2015-07-02 DIAGNOSIS — R911 Solitary pulmonary nodule: Secondary | ICD-10-CM | POA: Diagnosis not present

## 2015-07-02 DIAGNOSIS — I2510 Atherosclerotic heart disease of native coronary artery without angina pectoris: Secondary | ICD-10-CM | POA: Diagnosis not present

## 2015-07-02 NOTE — Progress Notes (Signed)
Subjective:    Patient ID: Kim [name], female    DOB: 25-Nov-1955, 60 y.o.   MRN: [id number]  HPI  Admit date: 06/24/2015 Discharge date: 06/28/2015  Discharge Diagnoses:   Principal Problem:  Acute respiratory distress ([facility]) Active Problems:  Dyslipidemia  COPD (chronic obstructive pulmonary disease) (HCC)  Solitary pulmonary nodule  COPD exacerbation (HCC)    Follow-up recommendations Follow-up with PCP in 3-5 days , including all additional recommended appointments as below Follow-up CBC, CMP in 3-5 days Patient needs to follow-up with pulmonary, for evaluation of pulmonary nodules seen on CT scan on 11/22/14 This is to be arranged for by PCP  60 year old patient who is seen following a recent hospital discharge in 4 transitional care management.  She has done quite well since discharge from the hospital.  She is on a prednisone taper and presently on 40 mg daily.  She has been compliant with her maintenance bronchodilators and was also discharged with a nebulizer machine for albuterol treatments. Her breathing is much improved.  Only mild nonproductive residual cough.  No fever or wheezing  Hospital records reviewed  Past Medical History  Diagnosis Date  . C8 RADICULOPATHY 06/05/2009  . COPD 08/31/2007  . DEPRESSION 11/19/2006  . DYSLIPIDEMIA 03/12/2009  . 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 OSTEOARTHRITIS 06/05/2009  . PARESTHESIA 08/24/2007  . TOBACCO ABUSE 01/25/2010    Social History   Social History  . Marital Status: Married    Spouse Name: N/A  . Number of Children: 2  . Years of Education: N/A   Occupational History  . Patient care aide-sits with elderly lady    Social History Main Topics  . Smoking status: Former Smoker -- 1.50 packs/day for 30 years    Types: Cigarettes    Quit date: 12/21/2013  . Smokeless tobacco: Never Used  . Alcohol Use: No  . Drug Use: No  . Sexual Activity: Not on file   Other Topics Concern  . Not on file   Social  History Narrative    Past Surgical History  Procedure Laterality Date  . Appendectomy  1969  . Cholecystectomy  1989  . Exploratory laparotomy  1969  . Collapse lung  1990    Family History  Problem Relation Age of Onset  . Cancer Mother     lung  . Heart attack Mother   . Hypertension Sister   . Multiple sclerosis Brother   . Multiple sclerosis Other   . Alcohol abuse Other   . Arthritis Other   . Diabetes Other   . Kidney disease Other   . Cancer Other     lung,ovarian,skin, uterine  . Stroke Other   . Heart disease Other   . Melanoma Other   . Diabetes Sister   . Rheum arthritis Sister   . Thyroid disease Sister   . Colon cancer Neg Hx   . Heart attack Maternal Uncle   . Heart attack Other     NEICE  . Heart attack Other     NEPHEW  . Hypertension Brother   . Stroke Maternal Grandmother     Allergies  Allergen Reactions  . Penicillins Hives    Has patient had a PCN reaction causing immediate rash, facial/tongue/throat swelling, SOB or lightheadedness with hypotension: Yes Has patient had a PCN reaction causing severe rash involving mucus membranes or skin necrosis: No Has patient had a PCN reaction that required hospitalization No Has patient had a PCN reaction occurring within the last 10 years:  No If all of the above answers are "NO", then may proceed with Cephalosporin use.     Current Outpatient Prescriptions on File Prior to Visit  Medication Sig Dispense Refill  . albuterol (PROVENTIL HFA;VENTOLIN HFA) 108 (90 BASE) MCG/ACT inhaler Inhale 2 puffs into the lungs every 4 (four) hours as needed for wheezing or shortness of breath.    Marland Kitchen albuterol (PROVENTIL) (2.5 MG/3ML) 0.083% nebulizer solution Take 3 mLs (2.5 mg total) by nebulization every 2 (two) hours as needed for wheezing. 75 mL 12  . ALPRAZolam (XANAX) 0.5 MG tablet TAKE 1/2 TO 1 TABLET BY MOUTH 3 TIMES A DAY AS NEEDED 90 tablet 2  . budesonide-formoterol (SYMBICORT) 160-4.5 MCG/ACT inhaler  Inhale 2 puffs into the lungs 2 (two) times daily. 1 Inhaler 5  . chlorpheniramine-HYDROcodone (TUSSIONEX) 10-8 MG/5ML SUER Take 5 mLs by mouth every 12 (twelve) hours. 140 mL 0  . Cholecalciferol (VITAMIN D3) 1000 UNITS CAPS Take 1 capsule by mouth 2 (two) times daily.   0  . Dextromethorphan-Guaifenesin (MUCINEX DM MAXIMUM STRENGTH) 60-1200 MG TB12 Take 1,200 mg by mouth 2 (two) times daily.    Marland Kitchen HYDROcodone-acetaminophen (NORCO) 7.5-325 MG tablet Take 1 tablet by mouth every 6 (six) hours as needed for moderate pain. 90 tablet 0  . nystatin (MYCOSTATIN) 100000 UNIT/ML suspension Take 5 mLs (500,000 Units total) by mouth 4 (four) times daily. (Patient taking differently: Take 5 mLs by mouth daily as needed. ) 60 mL 0  . oseltamivir (TAMIFLU) 75 MG capsule Take 1 capsule (75 mg total) by mouth 2 (two) times daily. 8 capsule 0  . PAXIL CR 25 MG 24 hr tablet Take 1 tablet by mouth  every morning 90 tablet 1  . pravastatin (PRAVACHOL) 20 MG tablet Take 1 tablet (20 mg total) by mouth every evening. 90 tablet 3  . predniSONE (DELTASONE) 20 MG tablet 3 tablets 3 days, 2 tablets3 days, 1.5 tablets 3 days, 1 tablet 3 days, half a tablet 3 days, then discontinue 200 tablet 0  . SPIRIVA HANDIHALER 18 MCG inhalation capsule Inhale contents of 1 capsule daily  1   No current facility-administered medications on file prior to visit.    BP 160/78 mmHg  Pulse 72  Temp(Src) 98 F (36.7 C) (Oral)  Resp 20  Ht 5\' 3"  (1.6 m)  Wt 127 lb (57.607 kg)  BMI 22.50 kg/m2  SpO2 95%     Review of Systems  Constitutional: Positive for activity change, appetite change and fatigue.  HENT: Negative for congestion, dental problem, hearing loss, rhinorrhea, sinus pressure, sore throat and tinnitus.   Eyes: Negative for pain, discharge and visual disturbance.  Respiratory: Positive for cough and shortness of breath.   Cardiovascular: Negative for chest pain, palpitations and leg swelling.  Gastrointestinal:  Negative for nausea, vomiting, abdominal pain, diarrhea, constipation, blood in stool and abdominal distention.  Genitourinary: Negative for dysuria, urgency, frequency, hematuria, flank pain, vaginal bleeding, vaginal discharge, difficulty urinating, vaginal pain and pelvic pain.  Musculoskeletal: Negative for joint swelling, arthralgias and gait problem.  Skin: Negative for rash.  Neurological: Negative for dizziness, syncope, speech difficulty, weakness, numbness and headaches.  Hematological: Negative for adenopathy.  Psychiatric/Behavioral: Negative for behavioral problems, dysphoric mood and agitation. The patient is not nervous/anxious.        Objective:   Physical Exam  Constitutional: She is oriented to person, place, and time. She appears well-developed and well-nourished. No distress.  Blood pressure 136/78  HENT:  Head: Normocephalic.  Right  Ear: External ear normal.  Left Ear: External ear normal.  Mouth/Throat: Oropharynx is clear and moist.  Eyes: Conjunctivae and EOM are normal. Pupils are equal, round, and reactive to light.  Neck: Normal range of motion. Neck supple. No thyromegaly present.  Cardiovascular: Normal rate, regular rhythm, normal heart sounds and intact distal pulses.   Pulse 70  Pulmonary/Chest: Effort normal.  Rare scattered rhonchi  Abdominal: Soft. Bowel sounds are normal. She exhibits no mass. There is no tenderness.  Musculoskeletal: Normal range of motion. She exhibits no edema.  Lymphadenopathy:    She has no cervical adenopathy.  Neurological: She is alert and oriented to person, place, and time.  Skin: Skin is warm and dry. No rash noted.  Psychiatric: She has a normal mood and affect. Her behavior is normal.          Assessment & Plan:  COPD exacerbation, stable History of pulmonary nodules.  Patient has an annual exam scheduled in October.  Consider follow-up chest CT at that time Coronary artery calcification noted on CT scanning.   Will continue statin therapy and aggressive risk factor modification Dyslipidemia.  Continue statin therapy  CPX as scheduled Continue prednisone taper and Tamiflu

## 2015-07-02 NOTE — Patient Instructions (Signed)
Complete prednisone taper and Tamiflu as prescribed  Annual exam as scheduled Call or return to clinic prn if these symptoms worsen or fail to improve as anticipated.

## 2015-07-02 NOTE — Progress Notes (Signed)
Pre visit review using our clinic review tool, if applicable. No additional management support is needed unless otherwise documented below in the visit note. 

## 2015-07-05 ENCOUNTER — Encounter (HOSPITAL_COMMUNITY): Payer: Self-pay | Admitting: Emergency Medicine

## 2015-07-05 ENCOUNTER — Encounter (HOSPITAL_COMMUNITY)
Admission: EM | Disposition: A | Discharge status: Home / Self Care | Payer: Self-pay | Source: Home / Self Care | Attending: Family Medicine

## 2015-07-05 ENCOUNTER — Inpatient Hospital Stay (HOSPITAL_COMMUNITY)
Admission: EM | Admit: 2015-07-05 | Discharge: 2015-07-08 | DRG: 190 | Disposition: A | Discharge status: Home / Self Care | Payer: 59 | Attending: Family Medicine | Admitting: Family Medicine

## 2015-07-05 ENCOUNTER — Emergency Department (HOSPITAL_COMMUNITY): Payer: 59

## 2015-07-05 ENCOUNTER — Other Ambulatory Visit (HOSPITAL_COMMUNITY): Payer: 59

## 2015-07-05 DIAGNOSIS — J9622 Acute and chronic respiratory failure with hypercapnia: Secondary | ICD-10-CM | POA: Diagnosis present

## 2015-07-05 DIAGNOSIS — R0902 Hypoxemia: Secondary | ICD-10-CM

## 2015-07-05 DIAGNOSIS — Z7951 Long term (current) use of inhaled steroids: Secondary | ICD-10-CM | POA: Diagnosis not present

## 2015-07-05 DIAGNOSIS — I5181 Takotsubo syndrome: Secondary | ICD-10-CM | POA: Diagnosis present

## 2015-07-05 DIAGNOSIS — Z8249 Family history of ischemic heart disease and other diseases of the circulatory system: Secondary | ICD-10-CM | POA: Diagnosis not present

## 2015-07-05 DIAGNOSIS — I5043 Acute on chronic combined systolic (congestive) and diastolic (congestive) heart failure: Secondary | ICD-10-CM | POA: Insufficient documentation

## 2015-07-05 DIAGNOSIS — I5041 Acute combined systolic (congestive) and diastolic (congestive) heart failure: Secondary | ICD-10-CM | POA: Diagnosis not present

## 2015-07-05 DIAGNOSIS — I2510 Atherosclerotic heart disease of native coronary artery without angina pectoris: Secondary | ICD-10-CM

## 2015-07-05 DIAGNOSIS — R0603 Acute respiratory distress: Secondary | ICD-10-CM | POA: Diagnosis present

## 2015-07-05 DIAGNOSIS — D72829 Elevated white blood cell count, unspecified: Secondary | ICD-10-CM

## 2015-07-05 DIAGNOSIS — J9601 Acute respiratory failure with hypoxia: Secondary | ICD-10-CM | POA: Diagnosis not present

## 2015-07-05 DIAGNOSIS — Z79899 Other long term (current) drug therapy: Secondary | ICD-10-CM | POA: Diagnosis not present

## 2015-07-05 DIAGNOSIS — J441 Chronic obstructive pulmonary disease with (acute) exacerbation: Secondary | ICD-10-CM | POA: Diagnosis not present

## 2015-07-05 DIAGNOSIS — Z87891 Personal history of nicotine dependence: Secondary | ICD-10-CM

## 2015-07-05 DIAGNOSIS — J9610 Chronic respiratory failure, unspecified whether with hypoxia or hypercapnia: Secondary | ICD-10-CM | POA: Diagnosis present

## 2015-07-05 DIAGNOSIS — I249 Acute ischemic heart disease, unspecified: Secondary | ICD-10-CM

## 2015-07-05 DIAGNOSIS — M1990 Unspecified osteoarthritis, unspecified site: Secondary | ICD-10-CM | POA: Diagnosis present

## 2015-07-05 DIAGNOSIS — F329 Major depressive disorder, single episode, unspecified: Secondary | ICD-10-CM | POA: Diagnosis present

## 2015-07-05 DIAGNOSIS — I429 Cardiomyopathy, unspecified: Secondary | ICD-10-CM | POA: Diagnosis not present

## 2015-07-05 DIAGNOSIS — R739 Hyperglycemia, unspecified: Secondary | ICD-10-CM

## 2015-07-05 DIAGNOSIS — E785 Hyperlipidemia, unspecified: Secondary | ICD-10-CM

## 2015-07-05 DIAGNOSIS — T380X5A Adverse effect of glucocorticoids and synthetic analogues, initial encounter: Secondary | ICD-10-CM | POA: Diagnosis present

## 2015-07-05 DIAGNOSIS — R0600 Dyspnea, unspecified: Secondary | ICD-10-CM | POA: Diagnosis not present

## 2015-07-05 DIAGNOSIS — J9602 Acute respiratory failure with hypercapnia: Secondary | ICD-10-CM

## 2015-07-05 DIAGNOSIS — N179 Acute kidney failure, unspecified: Secondary | ICD-10-CM | POA: Diagnosis present

## 2015-07-05 DIAGNOSIS — E872 Acidosis: Secondary | ICD-10-CM | POA: Diagnosis present

## 2015-07-05 DIAGNOSIS — J9621 Acute and chronic respiratory failure with hypoxia: Secondary | ICD-10-CM | POA: Diagnosis present

## 2015-07-05 DIAGNOSIS — I5021 Acute systolic (congestive) heart failure: Secondary | ICD-10-CM | POA: Diagnosis not present

## 2015-07-05 DIAGNOSIS — Z880 Allergy status to penicillin: Secondary | ICD-10-CM

## 2015-07-05 HISTORY — PX: CARDIAC CATHETERIZATION: SHX172

## 2015-07-05 LAB — I-STAT TROPONIN, ED: Troponin i, poc: 0.1 ng/mL (ref 0.00–0.08)

## 2015-07-05 LAB — INFLUENZA PANEL BY PCR (TYPE A & B)
H1N1 flu by pcr: NOT DETECTED
Influenza A By PCR: NEGATIVE
Influenza B By PCR: NEGATIVE

## 2015-07-05 LAB — COMPREHENSIVE METABOLIC PANEL
ALBUMIN: 3.5 g/dL (ref 3.5–5.0)
ALT: 63 U/L — AB (ref 14–54)
AST: 79 U/L — AB (ref 15–41)
Alkaline Phosphatase: 69 U/L (ref 38–126)
Anion gap: 14 (ref 5–15)
BUN: 30 mg/dL — ABNORMAL HIGH (ref 6–20)
CHLORIDE: 105 mmol/L (ref 101–111)
CO2: 20 mmol/L — AB (ref 22–32)
CREATININE: 1.15 mg/dL — AB (ref 0.44–1.00)
Calcium: 9.1 mg/dL (ref 8.9–10.3)
GFR calc Af Amer: 58 mL/min — ABNORMAL LOW (ref >60)
GFR calc non Af Amer: 50 mL/min — ABNORMAL LOW (ref >60)
Glucose, Bld: 259 mg/dL — ABNORMAL HIGH (ref 65–99)
Potassium: 4.6 mmol/L (ref 3.5–5.1)
SODIUM: 139 mmol/L (ref 135–145)
Total Bilirubin: 0.6 mg/dL (ref 0.3–1.2)
Total Protein: 5.8 g/dL — ABNORMAL LOW (ref 6.5–8.1)

## 2015-07-05 LAB — I-STAT ARTERIAL BLOOD GAS, ED
Acid-base deficit: 2 mmol/L (ref 0.0–2.0)
BICARBONATE: 24.1 meq/L — AB (ref 20.0–24.0)
O2 Saturation: 100 %
PCO2 ART: 45.8 mmHg — AB (ref 35.0–45.0)
TCO2: 25 mmol/L (ref 0–100)
pH, Arterial: 7.327 — ABNORMAL LOW (ref 7.350–7.450)
pO2, Arterial: 209 mmHg — ABNORMAL HIGH (ref 80.0–100.0)

## 2015-07-05 LAB — GLUCOSE, CAPILLARY
GLUCOSE-CAPILLARY: 137 mg/dL — AB (ref 65–99)
GLUCOSE-CAPILLARY: 146 mg/dL — AB (ref 65–99)
GLUCOSE-CAPILLARY: 186 mg/dL — AB (ref 65–99)

## 2015-07-05 LAB — CBC
HCT: 46.1 % — ABNORMAL HIGH (ref 36.0–46.0)
Hemoglobin: 14.5 g/dL (ref 12.0–15.0)
MCH: 30.3 pg (ref 26.0–34.0)
MCHC: 31.5 g/dL (ref 30.0–36.0)
MCV: 96.4 fL (ref 78.0–100.0)
PLATELETS: 271 10*3/uL (ref 150–400)
RBC: 4.78 MIL/uL (ref 3.87–5.11)
RDW: 15.4 % (ref 11.5–15.5)
WBC: 21.8 10*3/uL — AB (ref 4.0–10.5)

## 2015-07-05 LAB — TROPONIN I
TROPONIN I: 0.53 ng/mL — AB (ref <0.031)
TROPONIN I: 0.56 ng/mL — AB (ref <0.031)

## 2015-07-05 LAB — PROTIME-INR
INR: 0.98 (ref 0.00–1.49)
Prothrombin Time: 13.2 seconds (ref 11.6–15.2)

## 2015-07-05 LAB — CBG MONITORING, ED: Glucose-Capillary: 162 mg/dL — ABNORMAL HIGH (ref 65–99)

## 2015-07-05 LAB — PROCALCITONIN: Procalcitonin: <0.1 ng/mL

## 2015-07-05 LAB — APTT: APTT: 24 s (ref 24–37)

## 2015-07-05 LAB — BRAIN NATRIURETIC PEPTIDE: B NATRIURETIC PEPTIDE 5: 124.6 pg/mL — AB (ref 0.0–100.0)

## 2015-07-05 LAB — MRSA PCR SCREENING: MRSA BY PCR: NEGATIVE

## 2015-07-05 SURGERY — LEFT HEART CATH AND CORONARY ANGIOGRAPHY

## 2015-07-05 MED ORDER — HEPARIN SODIUM (PORCINE) 5000 UNIT/ML IJ SOLN
60.0000 [IU]/kg | INTRAMUSCULAR | Status: AC
  Administered 2015-07-05: 3450 [IU] via INTRAVENOUS
  Filled 2015-07-05: qty 1

## 2015-07-05 MED ORDER — PRAVASTATIN SODIUM 20 MG PO TABS
20.0000 mg | ORAL_TABLET | Freq: Every evening | ORAL | Status: DC
  Administered 2015-07-05 – 2015-07-06 (×2): 20 mg via ORAL
  Filled 2015-07-05 (×3): qty 1

## 2015-07-05 MED ORDER — ASPIRIN 81 MG PO CHEW: 81.0000 mg | CHEWABLE_TABLET | ORAL | Status: DC

## 2015-07-05 MED ORDER — HEPARIN SODIUM (PORCINE) 5000 UNIT/ML IJ SOLN: 5000.0000 [IU] | Freq: Three times a day (TID) | INTRAMUSCULAR | Status: DC

## 2015-07-05 MED ORDER — VANCOMYCIN HCL IN DEXTROSE 1-5 GM/200ML-% IV SOLN
1000.0000 mg | Freq: Once | INTRAVENOUS | Status: AC
  Administered 2015-07-05: 1000 mg via INTRAVENOUS
  Filled 2015-07-05: qty 200

## 2015-07-05 MED ORDER — HEPARIN SODIUM (PORCINE) 1000 UNIT/ML IJ SOLN
INTRAMUSCULAR | Status: DC | PRN
  Administered 2015-07-05: 3000 [IU] via INTRAVENOUS

## 2015-07-05 MED ORDER — ACETAMINOPHEN 325 MG PO TABS: 650.0000 mg | ORAL_TABLET | Freq: Four times a day (QID) | ORAL | Status: DC | PRN

## 2015-07-05 MED ORDER — SODIUM CHLORIDE 0.9 % WEIGHT BASED INFUSION: 1.0000 mL/kg/h | INTRAVENOUS | Status: DC

## 2015-07-05 MED ORDER — SODIUM CHLORIDE 0.9% FLUSH
3.0000 mL | Freq: Two times a day (BID) | INTRAVENOUS | Status: DC
  Administered 2015-07-06: 3 mL via INTRAVENOUS

## 2015-07-05 MED ORDER — HEPARIN (PORCINE) IN NACL 2-0.9 UNIT/ML-% IJ SOLN
INTRAMUSCULAR | Status: DC | PRN
  Administered 2015-07-05: 1500 mL

## 2015-07-05 MED ORDER — FENTANYL CITRATE (PF) 100 MCG/2ML IJ SOLN
INTRAMUSCULAR | Status: AC
  Filled 2015-07-05: qty 2

## 2015-07-05 MED ORDER — SODIUM CHLORIDE 0.9 % IV SOLN: 250.0000 mL | INTRAVENOUS | Status: DC | PRN

## 2015-07-05 MED ORDER — FENTANYL CITRATE (PF) 100 MCG/2ML IJ SOLN
INTRAMUSCULAR | Status: DC | PRN
  Administered 2015-07-05: 25 ug via INTRAVENOUS

## 2015-07-05 MED ORDER — IOPAMIDOL (ISOVUE-370) INJECTION 76%
INTRAVENOUS | Status: AC
  Filled 2015-07-05: qty 100

## 2015-07-05 MED ORDER — SODIUM CHLORIDE 0.9 % IV SOLN
250.0000 mg | Freq: Four times a day (QID) | INTRAVENOUS | Status: DC
  Administered 2015-07-05 – 2015-07-08 (×13): 250 mg via INTRAVENOUS
  Filled 2015-07-05 (×16): qty 250

## 2015-07-05 MED ORDER — ACETAMINOPHEN 650 MG RE SUPP: 650.0000 mg | Freq: Four times a day (QID) | RECTAL | Status: DC | PRN

## 2015-07-05 MED ORDER — BUDESONIDE 0.5 MG/2ML IN SUSP
0.5000 mg | Freq: Two times a day (BID) | RESPIRATORY_TRACT | Status: DC
  Administered 2015-07-05 – 2015-07-07 (×5): 0.5 mg via RESPIRATORY_TRACT
  Filled 2015-07-05 (×6): qty 2

## 2015-07-05 MED ORDER — PANTOPRAZOLE SODIUM 40 MG IV SOLR
40.0000 mg | INTRAVENOUS | Status: DC
  Administered 2015-07-05 – 2015-07-06 (×2): 40 mg via INTRAVENOUS
  Filled 2015-07-05 (×2): qty 40

## 2015-07-05 MED ORDER — MIDAZOLAM HCL 2 MG/2ML IJ SOLN
INTRAMUSCULAR | Status: AC
  Filled 2015-07-05: qty 2

## 2015-07-05 MED ORDER — SODIUM CHLORIDE 0.9 % IV SOLN
INTRAVENOUS | Status: DC
Start: 2015-07-05 — End: 2015-07-05
  Administered 2015-07-05: 11:00:00 via INTRAVENOUS

## 2015-07-05 MED ORDER — DM-GUAIFENESIN ER 30-600 MG PO TB12
2.0000 | ORAL_TABLET | Freq: Two times a day (BID) | ORAL | Status: DC
  Administered 2015-07-05 – 2015-07-06 (×2): 2 via ORAL
  Filled 2015-07-05 (×2): qty 2

## 2015-07-05 MED ORDER — LIDOCAINE HCL (PF) 1 % IJ SOLN
INTRAMUSCULAR | Status: AC
  Filled 2015-07-05: qty 30

## 2015-07-05 MED ORDER — HEPARIN (PORCINE) IN NACL 100-0.45 UNIT/ML-% IJ SOLN
700.0000 [IU]/h | INTRAMUSCULAR | Status: DC
  Filled 2015-07-05: qty 250

## 2015-07-05 MED ORDER — ENOXAPARIN SODIUM 40 MG/0.4ML ~~LOC~~ SOLN
40.0000 mg | SUBCUTANEOUS | Status: DC
  Administered 2015-07-06 – 2015-07-08 (×3): 40 mg via SUBCUTANEOUS
  Filled 2015-07-05 (×3): qty 0.4

## 2015-07-05 MED ORDER — ALBUTEROL (5 MG/ML) CONTINUOUS INHALATION SOLN
10.0000 mg/h | INHALATION_SOLUTION | Freq: Once | RESPIRATORY_TRACT | Status: AC
  Administered 2015-07-05: 10 mg/h via RESPIRATORY_TRACT
  Filled 2015-07-05: qty 20

## 2015-07-05 MED ORDER — HEPARIN SODIUM (PORCINE) 1000 UNIT/ML IJ SOLN
INTRAMUSCULAR | Status: AC
  Filled 2015-07-05: qty 1

## 2015-07-05 MED ORDER — ALPRAZOLAM 0.25 MG PO TABS
0.2500 mg | ORAL_TABLET | Freq: Every evening | ORAL | Status: DC | PRN
  Administered 2015-07-06 – 2015-07-07 (×2): 0.25 mg via ORAL
  Filled 2015-07-05 (×2): qty 1

## 2015-07-05 MED ORDER — VERAPAMIL HCL 2.5 MG/ML IV SOLN
INTRAVENOUS | Status: AC
  Filled 2015-07-05: qty 2

## 2015-07-05 MED ORDER — PAROXETINE HCL ER 25 MG PO TB24
25.0000 mg | ORAL_TABLET | Freq: Every day | ORAL | Status: DC
  Administered 2015-07-06 – 2015-07-08 (×3): 25 mg via ORAL
  Filled 2015-07-05 (×7): qty 1

## 2015-07-05 MED ORDER — IPRATROPIUM-ALBUTEROL 0.5-2.5 (3) MG/3ML IN SOLN
3.0000 mL | RESPIRATORY_TRACT | Status: DC
  Administered 2015-07-05 (×3): 3 mL via RESPIRATORY_TRACT
  Filled 2015-07-05 (×3): qty 3

## 2015-07-05 MED ORDER — SODIUM CHLORIDE 0.9 % IV SOLN
INTRAVENOUS | Status: DC
  Administered 2015-07-05: 08:00:00 via INTRAVENOUS

## 2015-07-05 MED ORDER — SODIUM CHLORIDE 0.9% FLUSH
3.0000 mL | Freq: Two times a day (BID) | INTRAVENOUS | Status: DC
Start: 2015-07-05 — End: 2015-07-05

## 2015-07-05 MED ORDER — IPRATROPIUM-ALBUTEROL 0.5-2.5 (3) MG/3ML IN SOLN
3.0000 mL | Freq: Two times a day (BID) | RESPIRATORY_TRACT | Status: DC
  Administered 2015-07-06: 3 mL via RESPIRATORY_TRACT
  Filled 2015-07-05: qty 3

## 2015-07-05 MED ORDER — MIDAZOLAM HCL 2 MG/2ML IJ SOLN
INTRAMUSCULAR | Status: DC | PRN
  Administered 2015-07-05: 1 mg via INTRAVENOUS

## 2015-07-05 MED ORDER — ONDANSETRON HCL 4 MG/2ML IJ SOLN: 4.0000 mg | Freq: Four times a day (QID) | INTRAMUSCULAR | Status: DC | PRN

## 2015-07-05 MED ORDER — TRAZODONE HCL 50 MG PO TABS: 25.0000 mg | ORAL_TABLET | Freq: Every evening | ORAL | Status: DC | PRN

## 2015-07-05 MED ORDER — INSULIN ASPART 100 UNIT/ML ~~LOC~~ SOLN
0.0000 [IU] | SUBCUTANEOUS | Status: DC
  Administered 2015-07-05: 1 [IU] via SUBCUTANEOUS
  Administered 2015-07-05 (×2): 2 [IU] via SUBCUTANEOUS
  Administered 2015-07-05 – 2015-07-06 (×3): 1 [IU] via SUBCUTANEOUS
  Filled 2015-07-05: qty 1

## 2015-07-05 MED ORDER — LIDOCAINE HCL (PF) 1 % IJ SOLN
INTRAMUSCULAR | Status: DC | PRN
  Administered 2015-07-05: 3 mL via INTRADERMAL

## 2015-07-05 MED ORDER — IOPAMIDOL (ISOVUE-370) INJECTION 76%
INTRAVENOUS | Status: DC | PRN
  Administered 2015-07-05: 65 mL via INTRA_ARTERIAL

## 2015-07-05 MED ORDER — SODIUM CHLORIDE 0.9% FLUSH: 3.0000 mL | INTRAVENOUS | Status: DC | PRN

## 2015-07-05 MED ORDER — POLYETHYLENE GLYCOL 3350 17 G PO PACK: 17.0000 g | PACK | Freq: Every day | ORAL | Status: DC | PRN

## 2015-07-05 MED ORDER — SODIUM CHLORIDE 0.9 % IV SOLN
INTRAVENOUS | Status: DC
  Administered 2015-07-05: 22:00:00 via INTRAVENOUS

## 2015-07-05 MED ORDER — BISACODYL 5 MG PO TBEC: 5.0000 mg | DELAYED_RELEASE_TABLET | Freq: Every day | ORAL | Status: DC | PRN

## 2015-07-05 MED ORDER — METHYLPREDNISOLONE SODIUM SUCC 125 MG IJ SOLR
60.0000 mg | Freq: Four times a day (QID) | INTRAMUSCULAR | Status: DC
  Administered 2015-07-05 – 2015-07-06 (×3): 60 mg via INTRAVENOUS
  Filled 2015-07-05 (×3): qty 2

## 2015-07-05 MED ORDER — CETYLPYRIDINIUM CHLORIDE 0.05 % MT LIQD
7.0000 mL | Freq: Two times a day (BID) | OROMUCOSAL | Status: DC
  Administered 2015-07-05 – 2015-07-08 (×6): 7 mL via OROMUCOSAL

## 2015-07-05 MED ORDER — ONDANSETRON HCL 4 MG PO TABS: 4.0000 mg | ORAL_TABLET | Freq: Four times a day (QID) | ORAL | Status: DC | PRN

## 2015-07-05 MED ORDER — HYDROCOD POLST-CPM POLST ER 10-8 MG/5ML PO SUER
5.0000 mL | Freq: Two times a day (BID) | ORAL | Status: DC
  Administered 2015-07-05 – 2015-07-06 (×3): 5 mL via ORAL
  Filled 2015-07-05 (×3): qty 5

## 2015-07-05 MED ORDER — VANCOMYCIN HCL IN DEXTROSE 750-5 MG/150ML-% IV SOLN
750.0000 mg | Freq: Two times a day (BID) | INTRAVENOUS | Status: DC
  Administered 2015-07-05: 750 mg via INTRAVENOUS
  Filled 2015-07-05 (×3): qty 150

## 2015-07-05 MED ORDER — HEPARIN (PORCINE) IN NACL 2-0.9 UNIT/ML-% IJ SOLN
INTRAMUSCULAR | Status: AC
  Filled 2015-07-05: qty 1500

## 2015-07-05 MED ORDER — SODIUM CHLORIDE 0.9% FLUSH
3.0000 mL | Freq: Two times a day (BID) | INTRAVENOUS | Status: DC
  Administered 2015-07-05 (×2): 3 mL via INTRAVENOUS

## 2015-07-05 SURGICAL SUPPLY — 10 items
CATH INFINITI 5FR ANG PIGTAIL (CATHETERS) ×2 IMPLANT
CATH OPTITORQUE TIG 4.0 5F (CATHETERS) ×2 IMPLANT
DEVICE RAD COMP TR BAND LRG (VASCULAR PRODUCTS) ×2 IMPLANT
GLIDESHEATH SLEND A-KIT 6F 22G (SHEATH) ×2 IMPLANT
KIT HEART LEFT (KITS) ×3 IMPLANT
PACK CARDIAC CATHETERIZATION (CUSTOM PROCEDURE TRAY) ×3 IMPLANT
SYR MEDRAD MARK V 150ML (SYRINGE) ×3 IMPLANT
TRANSDUCER W/STOPCOCK (MISCELLANEOUS) ×3 IMPLANT
TUBING CIL FLEX 10 FLL-RA (TUBING) ×3 IMPLANT
WIRE SAFE-T 1.5MM-J .035X260CM (WIRE) ×2 IMPLANT

## 2015-07-05 NOTE — Progress Notes (Addendum)
   Pain free currently. Breathing has normalized and off BiPAP  ECG c/w inferoapical infarct. ? Occlusion mid-distal LAD.  Troponin gently climbing and therefore will move to cath ASAP. Will be next case.

## 2015-07-05 NOTE — Progress Notes (Signed)
Pharmacy Antibiotic Note Kim [name] is a 60 y.o. female admitted on 07/05/2015 with concern for sepsis in setting of stress cardiomyopathy and leukocytosis. Pharmacy has been consulted for vancomycin dosing.  Plan: 1. Vancomycin 750 mg Q12 hours as hopeful that renal function will trend down towards baseline in next 24 hours 2. If continued will obtain level at [facility]; goal 15-20 3. On Primaxin (carbapenem); would consider quick de escalation of abx unless pt has known hx of MDR pathogens    Height: 5\' 4"  (162.6 cm) Weight: 127 lb (57.607 kg) IBW/kg (Calculated) : 54.7  Temp (24hrs), Avg:97.2 F (36.2 C), Min:97.2 F (36.2 C), Max:97.2 F (36.2 C)   Recent Labs Lab 07/05/15 0757  WBC 21.8*  CREATININE 1.15*    Estimated Creatinine Clearance: 44.4 mL/min (by C-G formula based on Cr of 1.15).    Allergies  Allergen Reactions  . Penicillins Hives    Has patient had a PCN reaction causing immediate rash, facial/tongue/throat swelling, SOB or lightheadedness with hypotension: Yes Has patient had a PCN reaction causing severe rash involving mucus membranes or skin necrosis: No Has patient had a PCN reaction that required hospitalization No Has patient had a PCN reaction occurring within the last 10 years: No If all of the above answers are "NO", then may proceed with Cephalosporin use.     Antimicrobials this admission: 3/30 Vancomycin  >>  3/30 Imipenem >>   Dose adjustments this admission: n/a  Microbiology results: px  Thank you for allowing pharmacy to be a part of this patient's care.  Duayne Cal 07/05/2015 10:11 AM

## 2015-07-05 NOTE — Interval H&P Note (Signed)
History and Physical Interval Note:  07/05/2015 2:10 PM  [name]  has presented today for surgery, with the diagnosis of /NSTEMI.  Pain free currently. Breathing has normalized and off BiPAP  ECG c/w inferoapical infarct. ? Occlusion mid-distal LAD.  Troponin gently climbing and therefore will move to cath ASAP. Will be next case.     The various methods of treatment have been discussed with the patient and family. After consideration of risks, benefits and other options for treatment, the patient has consented to  Procedure(s): Left Heart Cath and Coronary Angiography (N/A) as a surgical intervention .  The patient's history has been reviewed, patient examined, no change in status, stable for surgery.  I have reviewed the patient's chart and labs.  Questions were answered to the patient's satisfaction.    Cath Lab Visit (complete for each Cath Lab visit)  Clinical Evaluation Leading to the Procedure:   ACS: Yes.    Non-ACS:    Anginal Classification: CCS IV  Anti-ischemic medical therapy: No Therapy  Non-Invasive Test Results: No non-invasive testing performed  Prior CABG: No previous CABG   TIMI Score  Patient Information:  TIMI Score is 3   UA/NSTEMI and intermediate-risk features (e.g., TIMI score 3-4) for short-term risk of death or nonfatal MI  Revascularization of the presumed culprit artery   A (9)  Indication: 10; Score: 9    HARDING, DAVID W

## 2015-07-05 NOTE — Progress Notes (Signed)
ANTICOAGULATION CONSULT NOTE - Initial Consult  Pharmacy Consult for heparin Indication: chest pain/ACS  Allergies  Allergen Reactions  . Penicillins Hives    Has patient had a PCN reaction causing immediate rash, facial/tongue/throat swelling, SOB or lightheadedness with hypotension: Yes Has patient had a PCN reaction causing severe rash involving mucus membranes or skin necrosis: No Has patient had a PCN reaction that required hospitalization No Has patient had a PCN reaction occurring within the last 10 years: No If all of the above answers are "NO", then may proceed with Cephalosporin use.     Patient Measurements: Height: 5\' 4"  (162.6 cm) Weight: 127 lb (57.607 kg) IBW/kg (Calculated) : 54.7  Vital Signs: Temp: 97.2 F (36.2 C) (03/30 0830) Temp Source: Axillary (03/30 0830) BP: 93/64 mmHg (03/30 1245) Pulse Rate: 89 (03/30 1245)  Labs:  Recent Labs  07/05/15 0757  HGB 14.5  HCT 46.1*  PLT 271  APTT 24  LABPROT 13.2  INR 0.98  CREATININE 1.15*    Estimated Creatinine Clearance: 44.4 mL/min (by C-G formula based on Cr of 1.15).   Medical History: Past Medical History  Diagnosis Date  . C8 RADICULOPATHY 06/05/2009  . COPD 08/31/2007  . DEPRESSION 11/19/2006  . DYSLIPIDEMIA 03/12/2009  . 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 OSTEOARTHRITIS 06/05/2009  . PARESTHESIA 08/24/2007  . TOBACCO ABUSE 01/25/2010    Assessment: 68 yoF admitted with CP and ECG ECG c/w inferoapical infarct. Pharmacy consulted to dose heparin. Planing to cath asap. No anticoagulation PTA. CBC stable.   Goal of Therapy:  Heparin level 0.3-0.7 units/ml Monitor platelets by anticoagulation protocol: Yes   Plan:  1. Omit bolus as received 3450 units at 0800 this am 2. Start heparin infusion at 700 units/hr 3. Check anti-Xa level in 8 hours and daily while on heparin 4. Continue to monitor H&H and platelets  Duayne Cal 07/05/2015,1:03 PM

## 2015-07-05 NOTE — ED Notes (Signed)
Attempted report x1. 

## 2015-07-05 NOTE — ED Notes (Signed)
Per EMS, pt coming in for sudden onset of SOB starting at 600 this morning. Pt started on C-pap by EMS. Pt also reports central chest tightness. Pt given 10mg  of albuterol and .5 of Atrovent and 125mg  of solu-medrol. Pt also given 324 of aspirin. Pt alert x4.  Pt started on Bi-pap upon arrival. Abnormal EKG shown to Dr. Tomi Bamberger.

## 2015-07-05 NOTE — Care Management Note (Signed)
Case Management Note  Patient Details  Name: NIVEA RIPPEL MRN: [id number] Date of Birth: 03/17/1956  Subjective/Objective:       Adm w elev trop, copd exacerb            Action/Plan:lives w husband, pcp dr Beverely Risen, has neb w [facility], refused [facility] last adm   Expected Discharge Date:                  Expected Discharge Plan:  [place]  In-House Referral:     Discharge planning Services  CM Consult  Post Acute Care Choice:    Choice offered to:     DME Arranged:    DME Agency:     HH Arranged:    [place] Agency:     Status of Service:     Medicare Important Message Given:    Date Medicare IM Given:    Medicare IM give by:    Date Additional Medicare IM Given:    Additional Medicare Important Message give by:     If discussed at [place] of Stay Meetings, dates discussed:    Additional Comments: ur review done  Lacretia Leigh, RN 07/05/2015, 1:51 PM

## 2015-07-05 NOTE — H&P (Signed)
[facility] History and Physical  Kim [name] [id number] DOB: 03-25-1956 DOA: 07/05/2015  Referring physician: Emergency Department PCP: [name], MD   CHIEF COMPLAINT:  Chest pressure and SOB               HPI: Kim [name] is a 60 y.o. female with COPD. She was last here a week ago with COPD exacerbation, influenza A. She was discharged on antibiotics, Tamiflu and a prednisone taper.  History of little difficult to obtain, patient on BiPAP, getting nebulizer. Husband at bedside helps with the history. Patient developed sudden onset of shortness of breath and what she describes as chest heaviness yesterday. The heaviness is not exertional, it is nearly constant, nonradiating. No nausea or vomiting. Patient has frequent episodes of diaphoresis but yesterday she was much more diaphoretic than usual. Her sats at home or in the eighties. Patient can normally get her set up by sitting upright but was unable to do so yesterday. She's been using nebulizers at home 3 times daily. She has a productive cough  Patient describes similar chest heaviness a year and a half ago when diagnosed with a heart attack in [place]. Husband tells me a cardiac catheter was done but the vessels were clear  ED workup:   No active disease on CXR.  Troponin 0.10 Glucose 250  EKG:    Sinus rhythm Left anterior fascicular block Anterolateral infarct, age indeterminate ST elevation, consider inferior injury                   Medications  0.9 %  sodium chloride infusion ( Intravenous New Bag/Given 07/05/15 0757)  heparin injection 3,450 Units (3,450 Units Intravenous Given 07/05/15 0755)  albuterol (PROVENTIL,VENTOLIN) solution continuous neb (10 mg/hr Nebulization Given 07/05/15 0850)    Review of Systems  Constitutional: Positive for diaphoresis.  HENT: Positive for hearing loss.   Eyes: Negative.   Respiratory: Positive for cough, sputum production and shortness of breath.    Cardiovascular: Positive for chest pain.  Gastrointestinal: Negative.   Genitourinary: Negative.   Skin: Negative.   Neurological: Negative.   Endo/Heme/Allergies: Negative.   Psychiatric/Behavioral: Negative.     Past Medical History  Diagnosis Date  . C8 RADICULOPATHY 06/05/2009  . COPD 08/31/2007  . DEPRESSION 11/19/2006  . DYSLIPIDEMIA 03/12/2009  . 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 OSTEOARTHRITIS 06/05/2009  . PARESTHESIA 08/24/2007  . TOBACCO ABUSE 01/25/2010   Past Surgical History  Procedure Laterality Date  . Appendectomy  1969  . Cholecystectomy  1989  . Exploratory laparotomy  1969  . Collapse lung  1990    SOCIAL HISTORY:  reports that she quit smoking about 18 months ago. Her smoking use included Cigarettes. She has a 45 pack-year smoking history. She has never used smokeless tobacco. She reports that she does not drink alcohol or use illicit drugs. Lives:  At home with husband    Assistive devices:   None needed for ambulation.   Allergies  Allergen Reactions  . Penicillins Hives    Has patient had a PCN reaction causing immediate rash, facial/tongue/throat swelling, SOB or lightheadedness with hypotension: Yes Has patient had a PCN reaction causing severe rash involving mucus membranes or skin necrosis: No Has patient had a PCN reaction that required hospitalization No Has patient had a PCN reaction occurring within the last 10 years: No If all of the above answers are "NO", then may proceed with Cephalosporin use.     Family History  Problem Relation Age  of Onset  . Cancer Mother     lung  . Heart attack Mother   . Hypertension Sister   . Multiple sclerosis Brother   . Multiple sclerosis Other   . Alcohol abuse Other   . Arthritis Other   . Diabetes Other   . Kidney disease Other   . Cancer Other     lung,ovarian,skin, uterine  . Stroke Other   . Heart disease Other   . Melanoma Other   . Diabetes Sister   . Rheum arthritis Sister   . Thyroid disease  Sister   . Colon cancer Neg Hx   . Heart attack Maternal Uncle   . Heart attack Other     NEICE  . Heart attack Other     NEPHEW  . Hypertension Brother   . Stroke Maternal Grandmother     Prior to Admission medications   Medication Sig Start Date End Date Taking? Authorizing Provider  albuterol (PROVENTIL HFA;VENTOLIN HFA) 108 (90 BASE) MCG/ACT inhaler Inhale 2 puffs into the lungs every 4 (four) hours as needed for wheezing or shortness of breath.    Historical Provider, MD  albuterol (PROVENTIL) (2.5 MG/3ML) 0.083% nebulizer solution Take 3 mLs (2.5 mg total) by nebulization every 2 (two) hours as needed for wheezing. 06/28/15   Reyne Dumas, MD  ALPRAZolam Duanne Moron) 0.5 MG tablet TAKE 1/2 TO 1 TABLET BY MOUTH 3 TIMES A DAY AS NEEDED 04/11/15   Marletta Lor, MD  budesonide-formoterol [facility]) 160-4.5 MCG/ACT inhaler Inhale 2 puffs into the lungs 2 (two) times daily. 06/28/15   Reyne Dumas, MD  chlorpheniramine-HYDROcodone (TUSSIONEX) 10-8 MG/5ML SUER Take 5 mLs by mouth every 12 (twelve) hours. 06/28/15   Reyne Dumas, MD  Cholecalciferol (VITAMIN D3) 1000 UNITS CAPS Take 1 capsule by mouth 2 (two) times daily.  04/01/14   Historical Provider, MD  Dextromethorphan-Guaifenesin (MUCINEX DM MAXIMUM STRENGTH) 60-1200 MG TB12 Take 1,200 mg by mouth 2 (two) times daily.    Historical Provider, MD  HYDROcodone-acetaminophen (NORCO) 7.5-325 MG tablet Take 1 tablet by mouth every 6 (six) hours as needed for moderate pain. 06/15/15   Marletta Lor, MD  nystatin (MYCOSTATIN) 100000 UNIT/ML suspension Take 5 mLs (500,000 Units total) by mouth 4 (four) times daily. Patient taking differently: Take 5 mLs by mouth daily as needed.  04/24/14   Marletta Lor, MD  oseltamivir (TAMIFLU) 75 MG capsule Take 1 capsule (75 mg total) by mouth 2 (two) times daily. 06/28/15   Reyne Dumas, MD  PAXIL CR 25 MG 24 hr tablet Take 1 tablet by mouth  every morning 05/04/15   Marletta Lor, MD   pravastatin (PRAVACHOL) 20 MG tablet Take 1 tablet (20 mg total) by mouth every evening. 07/13/14   [name], MD  predniSONE (DELTASONE) 20 MG tablet 3 tablets 3 days, 2 tablets3 days, 1.5 tablets 3 days, 1 tablet 3 days, half a tablet 3 days, then discontinue 06/28/15   Reyne Dumas, MD  SPIRIVA HANDIHALER 18 MCG inhalation capsule Inhale contents of 1 capsule daily 04/01/14   Historical Provider, MD   PHYSICAL EXAM: Filed Vitals:   07/05/15 0740  BP: 121/93  Pulse: 115  Resp: 28  SpO2: 100%    Wt Readings from Last 3 Encounters:  07/02/15 57.607 kg (127 lb)  06/28/15 57.7 kg (127 lb 3.3 oz)  06/15/15 55.339 kg (122 lb)    General:  Pleasant white female. Appears calm and comfortable Eyes: PER, normal lids, irises &  conjunctiva ENT: grossly normal hearing, lips & tongue Neck: no LAD, no masses Cardiovascular: RRR, no murmurs. No LE edema.  Respiratory: On bipap. Respirations slightly labored, diminished breath sounds, + wheezing.  Abdomen: soft, non-distended, non-tender, active bowel sounds. No obvious masses.  Skin: no rash seen on limited exam Musculoskeletal: grossly normal tone BUE/BLE Psychiatric: grossly normal mood and affect, speech fluent and appropriate Neurologic: grossly non-focal.         LABS ON ADMISSION:    Basic Metabolic Panel:  Recent Labs Lab 07/05/15 0757  NA 139  K 4.6  CL 105  CO2 20*  GLUCOSE 259*  BUN 30*  CREATININE 1.15*  CALCIUM 9.1   Liver Function Tests:  Recent Labs Lab 07/05/15 0757  AST 79*  ALT 63*  ALKPHOS 69  BILITOT 0.6  PROT 5.8*  ALBUMIN 3.5    CBC:  Recent Labs Lab 07/05/15 0757  WBC 21.8*  HGB 14.5  HCT 46.1*  MCV 96.4  PLT 271    CREATININE: 1.15 mg/dL ABNORMAL (07/05/15 0757) Estimated creatinine clearance - 42.5 mL/min  Radiological Exams on Admission: Dg Chest Port 1 View  07/05/2015  CLINICAL DATA:  Shortness of breath with worsening today EXAM: PORTABLE CHEST 1 VIEW  COMPARISON:  06/24/2015 FINDINGS: Hyperinflation again noted. No infiltrate or pleural effusion. No pulmonary edema. Bony thorax is stable. IMPRESSION: No active disease.  Hyperinflation again noted. Electronically Signed   By: Lahoma Crocker M.D.   On: 07/05/2015 08:10    ASSESSMENT / PLAN    Chest pain / ST elevation on EKG . Cardiology evaluating, suspects stress cardiomyopathy. Patient has CAD based on coronary calcifications on CT scan but no significant lesions on Cath in 2015 in [place] 1.5 years ago but anterior/ lateral wall motion abnormality raised concern for stress cardiomyopathy ([place] 1.5 yrs ago). -admit to Stepdown -heparin gtt in progress -She will likely need cardiac cath per Cardiology but would like improvement in respiratory  status first. Keep NPO  Acute respiratory failure with hypoxia (sats in 80s at home) secondary to COPD exacerbation.  -admit to [facility] -Pulmonary following and have started Vanco + Primaxin for HCAP. No suggestion of PNA    on xray. Antibiotics per CCM. Recommend rapid de escalation of antibiotics if workup   continues to be unremarkable. Will order procalcitonin, legionella and strep pneumo urinary  ag.  -IV steroids, Nebulizers, Wean off Bipap when able  Hyperglycemia, steroid induced.  -SSI  Leukocytosis, probably secondary to steroids.  -follow CBC  Hyperlipidemia -continue home statin  Mild transamininitis, less than 3x normal. Etiology not clear. Continue statin for now.  -recheck LFTs in couple of days - scheduled for Saturday  Depression. Stable.  Continue Paxil  CONSULTANTS:    Cardiology Pulmonary  Code Status: Full code DVT Prophylaxis: On Heparin gtt Family Communication:  Patient alert, oriented and understands plan of care.  Disposition Plan: Discharge to home in 24-48 hours   Time spent: 60 minutes Tye Savoy  NP [facility] Pager [phone]

## 2015-07-05 NOTE — Progress Notes (Signed)
CRITICAL VALUE ALERT  Critical value received:  Trop 0.53  Date of notification:  07/05/2015  Time of notification:  [id number]  Critical value read back:Yes.    Nurse who received alert:  Leonidas Romberg, RN  MD notified (1st page):  Dr. Tamala Julian  Time of first page:  1330  MD notified (2nd page):  Time of second page:  Responding MD:  Dr. Tamala Julian   Time MD responded:  [phone]

## 2015-07-05 NOTE — ED Provider Notes (Signed)
CSN: [id number]     Arrival date & time 07/05/15  [id number] History   First MD Initiated Contact with Patient 07/05/15 0745     Chief Complaint  Patient presents with  . Code STEMI    HPI Pt has a history of COPD with a recent hospitalization this month for resp issues. Pt woke up this am with severe chest tightness and shortness of breath.   This am she woke up with severe shortness of breath and chest tightness.  Called 911.  Ems noted the patient to be very pale , diaphoretic, with severe air hunger.  Treated with cpap, solumedrol, breathing treatments, asa. Some improvement but still with severe tightness in the center of her chest.   Past Medical History  Diagnosis Date  . C8 RADICULOPATHY 06/05/2009  . COPD 08/31/2007  . DEPRESSION 11/19/2006  . DYSLIPIDEMIA 03/12/2009  . 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 OSTEOARTHRITIS 06/05/2009  . PARESTHESIA 08/24/2007  . TOBACCO ABUSE 01/25/2010   Past Surgical History  Procedure Laterality Date  . Appendectomy  1969  . Cholecystectomy  1989  . Exploratory laparotomy  1969  . Collapse lung  1990   Family History  Problem Relation Age of Onset  . Cancer Mother     lung  . Heart attack Mother   . Hypertension Sister   . Multiple sclerosis Brother   . Multiple sclerosis Other   . Alcohol abuse Other   . Arthritis Other   . Diabetes Other   . Kidney disease Other   . Cancer Other     lung,ovarian,skin, uterine  . Stroke Other   . Heart disease Other   . Melanoma Other   . Diabetes Sister   . Rheum arthritis Sister   . Thyroid disease Sister   . Colon cancer Neg Hx   . Heart attack Maternal Uncle   . Heart attack Other     NEICE  . Heart attack Other     NEPHEW  . Hypertension Brother   . Stroke Maternal Grandmother    Social History  Substance Use Topics  . Smoking status: Former Smoker -- 1.50 packs/day for 30 years    Types: Cigarettes    Quit date: 12/21/2013  . Smokeless tobacco: Never Used  . Alcohol Use: No   OB History    No data available     Review of Systems    Allergies  Penicillins  Home Medications   Prior to Admission medications   Medication Sig Start Date End Date Taking? Authorizing Provider  albuterol (PROVENTIL HFA;VENTOLIN HFA) 108 (90 BASE) MCG/ACT inhaler Inhale 2 puffs into the lungs every 4 (four) hours as needed for wheezing or shortness of breath.    Historical Provider, MD  albuterol (PROVENTIL) (2.5 MG/3ML) 0.083% nebulizer solution Take 3 mLs (2.5 mg total) by nebulization every 2 (two) hours as needed for wheezing. 06/28/15   Reyne Dumas, MD  ALPRAZolam Duanne Moron) 0.5 MG tablet TAKE 1/2 TO 1 TABLET BY MOUTH 3 TIMES A DAY AS NEEDED 04/11/15   Marletta Lor, MD  budesonide-formoterol [facility]) 160-4.5 MCG/ACT inhaler Inhale 2 puffs into the lungs 2 (two) times daily. 06/28/15   Reyne Dumas, MD  chlorpheniramine-HYDROcodone (TUSSIONEX) 10-8 MG/5ML SUER Take 5 mLs by mouth every 12 (twelve) hours. 06/28/15   Reyne Dumas, MD  Cholecalciferol (VITAMIN D3) 1000 UNITS CAPS Take 1 capsule by mouth 2 (two) times daily.  04/01/14   Historical Provider, MD  Dextromethorphan-Guaifenesin (MUCINEX DM MAXIMUM STRENGTH) 60-1200 MG TB12 Take  1,200 mg by mouth 2 (two) times daily.    Historical Provider, MD  HYDROcodone-acetaminophen (NORCO) 7.5-325 MG tablet Take 1 tablet by mouth every 6 (six) hours as needed for moderate pain. 06/15/15   Marletta Lor, MD  nystatin (MYCOSTATIN) 100000 UNIT/ML suspension Take 5 mLs (500,000 Units total) by mouth 4 (four) times daily. Patient taking differently: Take 5 mLs by mouth daily as needed.  04/24/14   Marletta Lor, MD  oseltamivir (TAMIFLU) 75 MG capsule Take 1 capsule (75 mg total) by mouth 2 (two) times daily. 06/28/15   Reyne Dumas, MD  PAXIL CR 25 MG 24 hr tablet Take 1 tablet by mouth  every morning 05/04/15   Marletta Lor, MD  pravastatin (PRAVACHOL) 20 MG tablet Take 1 tablet (20 mg total) by mouth every evening. 07/13/14   [name], MD  predniSONE (DELTASONE) 20 MG tablet 3 tablets 3 days, 2 tablets3 days, 1.5 tablets 3 days, 1 tablet 3 days, half a tablet 3 days, then discontinue 06/28/15   Reyne Dumas, MD  SPIRIVA HANDIHALER 18 MCG inhalation capsule Inhale contents of 1 capsule daily 04/01/14   Historical Provider, MD   BP 121/93 mmHg  Pulse 115  Resp 28  SpO2 100% Physical Exam  Constitutional: She appears distressed.  Diaphoretic  HENT:  Head: Normocephalic and atraumatic.  Right Ear: External ear normal.  Left Ear: External ear normal.  Eyes: Conjunctivae are normal. Right eye exhibits no discharge. Left eye exhibits no discharge. No scleral icterus.  Neck: Neck supple. No tracheal deviation present.  Cardiovascular: Normal rate, regular rhythm and intact distal pulses.   Pulmonary/Chest: Accessory muscle usage present. No stridor. Tachypnea noted. No respiratory distress. She has decreased breath sounds. She has wheezes. She has no rales.  Abdominal: Soft. Bowel sounds are normal. She exhibits no distension. There is no tenderness. There is no rebound and no guarding.  Musculoskeletal: She exhibits no edema or tenderness.  Neurological: She is alert. She has normal strength. No cranial nerve deficit (no facial droop, extraocular movements intact, no slurred speech) or sensory deficit. She exhibits normal muscle tone. She displays no seizure activity. Coordination normal.  Skin: No rash noted. There is pallor.  Diaphoretic  Psychiatric: She has a normal mood and affect.  Nursing note and vitals reviewed.   ED Course  Procedures (including critical care time)  CRITICAL CARE Performed by: [id number] Total critical care time: 35 minutes Critical care time was exclusive of separately billable procedures and treating other patients. Critical care was necessary to treat or prevent imminent or life-threatening deterioration. Critical care was time spent personally by me on the following  activities: development of treatment plan with patient and/or surrogate as well as nursing, discussions with consultants, evaluation of patient's response to treatment, examination of patient, obtaining history from patient or surrogate, ordering and performing treatments and interventions, ordering and review of laboratory studies, ordering and review of radiographic studies, pulse oximetry and re-evaluation of patient's condition.  Labs Review Labs Reviewed  CBC - Abnormal; Notable for the following:    WBC 21.8 (*)    HCT 46.1 (*)    All other components within normal limits  I-STAT TROPOININ, ED - Abnormal; Notable for the following:    Troponin i, poc 0.10 (*)    All other components within normal limits  APTT  PROTIME-INR  COMPREHENSIVE METABOLIC PANEL    Imaging Review Dg Chest Port 1 View  07/05/2015  CLINICAL DATA:  Shortness of  breath with worsening today EXAM: PORTABLE CHEST 1 VIEW COMPARISON:  06/24/2015 FINDINGS: Hyperinflation again noted. No infiltrate or pleural effusion. No pulmonary edema. Bony thorax is stable. IMPRESSION: No active disease.  Hyperinflation again noted. Electronically Signed   By: Lahoma Crocker M.D.   On: 07/05/2015 08:10   I have personally reviewed and evaluated these images and lab results as part of my medical decision-making.   EKG Interpretation   Date/Time:  Thursday July 05 2015 07:44:29 EDT Ventricular Rate:  115 PR Interval:  149 QRS Duration: 101 QT Interval:  316 QTC Calculation: 437 R Axis:   -73 Text Interpretation:  Sinus tachycardia Inferior infarct, acute Anterior  infarct, acute (LAD) diffuse st elevation, new since prior tracing  Confirmed by Jerico Grisso  MD-J, Aleja Yearwood [id number]) on 07/05/2015 7:47:22 AM      MDM   Final diagnoses:  COPD exacerbation ([place])  Acute respiratory distress (H[facility])  Acute coronary syndrome (HCC)    Pt's symptoms concerning for a severe COPD Exacerbation. EMS treated her with BiPAP, breathing treatments,  steroids and aspirin. She is noting some improvement but continues to have significant chest tightness. The patient's EKG is concerning for the possibility of an ST elevation myocardial infarction. She has new elevation noted compared to prior EKGs. I activated a code STEMI and consult Dr. Burt Knack. The patient's situation is complex in that she is having a severe COPD exacerbation which is probably her primary issue.  Her EKG changes are concerning for ST elevation MI but not necessarily definitive.  Patient is not quite stable for cardiac catheterization this point. We'll continue BiPAP and breathing treatments. Dr. Tamala Julian from cardiology has seen the patient in ED. Plans on admitting her to the hospital and likely require catheterization later today but she will need respiratory stabilization.  I have ordered an hour-long breathing treatment. She has been given steroids by EMS. I will consult with pulmonary critical care and the medical service for admission    Dorie Rank, MD 07/05/15 1555

## 2015-07-05 NOTE — Progress Notes (Signed)
Nutrition Brief Note  Patient identified on the Malnutrition Screening Tool (MST) Report  Wt Readings from Last 15 Encounters:  07/05/15 127 lb (57.607 kg)  07/02/15 127 lb (57.607 kg)  06/28/15 127 lb 3.3 oz (57.7 kg)  06/15/15 122 lb (55.339 kg)  02/07/15 118 lb (53.524 kg)  12/20/14 119 lb (53.978 kg)  11/16/14 122 lb (55.339 kg)  09/21/14 128 lb (58.06 kg)  09/05/14 125 lb (56.7 kg)  08/21/14 128 lb (58.06 kg)  07/13/14 127 lb (57.607 kg)  04/24/14 127 lb (57.607 kg)  04/19/14 123 lb (55.792 kg)  04/05/14 123 lb (55.792 kg)  03/06/14 121 lb (54.39 kg)   60 year old female with the history of COPD, dyslipidemia,depression, tobacco abuse, solitary pulmonary nodule now presents with chest tightness and COPD exacerbation.  Pt being assisted to bathroom at time of visit. Pt is going down for cardiac cath soon, due to elevated troponin.  Pt was transitioned from Bi-pap to nasal cannula earlier today.   Pt is familiar to this RD due to previous admission last week. Pt with good appetite, generally consuming 2-3 meals per day. She recently started to consume a Premier Protein shake daily as a meal replacement.   Wt has been stable. She reports UBW between 125-130#. Wt has been stable since previous admission. Nutrition-focused physical exam completed on 06/25/15, which revealed no fat or muscle depletion. RD does not anticipate any changes to exam at this time.   Pt currently NPO for cardiac cath.   Body mass index is 21.79 kg/(m^2). Patient meets criteria for normal weight range based on current BMI.   Current diet order is NPO, patient is consuming approximately n/a% of meals at this time. Labs and medications reviewed.   No nutrition interventions warranted at this time. If nutrition issues arise, please consult RD.   Arienna Benegas A. Jimmye Norman, RD, LDN, CDE Pager: [phone] After hours Pager: [phone]

## 2015-07-05 NOTE — Consult Note (Signed)
Name: Kim [name] is a 60 y.o. female Admit date: 07/05/2015 Referring Physician:  Marye Round, MD Primary Physician:  Sindy Guadeloupe Primary Cardiologist:  Angelena Form  Reason for Consultation:  CP, Abn ECG and elevate troponin  ASSESSMENT:  1. 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 R/O primary coronary thrombotic event. Prior h/o stress CM and clean coronary arteries 2015 in similar situation.  2. Acute respiratory distress with respiratory failure in patient with h/o COPD and recent acute decompensation 3. Hyperlipidemia 4. CAD based on coronary calcification on CT scan. Cath 2015 with luminal irregularities but no hemodynamically/angiographically significant lesions. Anterio and lateral WMA raised concern for Stress cardiomyopathy. 5. Leukocytosis 6. Metabolic acidosis 7. Elevated liver enzymes.  PLAN: 1. IV heparin 2. Trend markers 3. When COPD exacerbation/respiratory distress is better, will need cath today. 4. IM to admit. I'm concerned about elevated WBC and h/o COPD. No PNA noted however. 5. Critical care/Pulmonry opinion since this is a bounce for COPD exacerbation.   HPI: 60 year old with history of COPD and heavy prior smoking history. The patient has coronary artery calcification by CT and luminal irregularities on coronary angiography done in [facility], [place]. At that time, under very similar circumstances, the patient presented with acute respiratory distress also complaining of chest pressure much more severe than today's presentation. Coronary angiography ultimately demonstrated three-vessel coronary luminal irregularities but no angiographically significant disease. 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  2 weeks ago the patient was hospitalized here with respiratory distress. She was treated for COPD exacerbation. She was discharged on 06/28/2015. Today she suddenly  developed dyspnea and chest pressure. This was similarly 2015 but the chest pressure not as severe. She is now on BiPAP and is already starting to breathe better with decreasing chest pressure. Initial EKG demonstrated 1 mm of ST elevation, V4 through V6. Repeat EKG with slower heart rate and with improved respiratory status reveals resolving ST segment changes. EKG is clearly different than admitting EKG 06/24/15.    PMH:   Past Medical History  Diagnosis Date  . C8 RADICULOPATHY 06/05/2009  . COPD 08/31/2007  . DEPRESSION 11/19/2006  . DYSLIPIDEMIA 03/12/2009  . 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 OSTEOARTHRITIS 06/05/2009  . PARESTHESIA 08/24/2007  . TOBACCO ABUSE 01/25/2010    PSH:   Past Surgical History  Procedure Laterality Date  . Appendectomy  1969  . Cholecystectomy  1989  . Exploratory laparotomy  1969  . Collapse lung  1990   Allergies:  Penicillins Prior to Admit Meds:   (Not in a hospital admission) Fam HX:    Family History  Problem Relation Age of Onset  . Cancer Mother     lung  . Heart attack Mother   . Hypertension Sister   . Multiple sclerosis Brother   . Multiple sclerosis Other   . Alcohol abuse Other   . Arthritis Other   . Diabetes Other   . Kidney disease Other   . Cancer Other     lung,ovarian,skin, uterine  . Stroke Other   . Heart disease Other   . Melanoma Other   . Diabetes Sister   . Rheum arthritis Sister   . Thyroid disease Sister   . Colon cancer Neg Hx   . Heart attack Maternal Uncle   . Heart attack Other     NEICE  . Heart attack Other     NEPHEW  . Hypertension Brother   . Stroke  Maternal Grandmother    Social HX:    Social History   Social History  . Marital Status: Married    Spouse Name: N/A  . Number of Children: 2  . Years of Education: N/A   Occupational History  . Patient care aide-sits with elderly lady    Social History Main Topics  . Smoking status: Former Smoker -- 1.50 packs/day for 30 years    Types: Cigarettes      Quit date: 12/21/2013  . Smokeless tobacco: Never Used  . Alcohol Use: No  . Drug Use: No  . Sexual Activity: Not on file   Other Topics Concern  . Not on file   Social History Narrative     Review of Systems: The patient has had cough since discharge from the hospital. She denies chills and fever. She states compliance with recently prescribed medical regimen.. All other systems are negative.  Physical Exam: Blood pressure 121/93, pulse 115, resp. rate 28, SpO2 100 %. Weight change:    Lying 45 with BiPAP. Respiratory rate is 16 Skin is pale Neck veins are difficult to assess but appear to be flat Chest reveals diminished breath sounds anteriorly and posteriorly. Cardiac exam is difficult due to support apparatus noise. No obvious rub or murmur is heard. Abdomen is soft. No bruits are heard. Extremities reveal no peripheral edema. Radial pulses are 2+ bilateral The patient is awake, conversant, and exhibits no evidence of focal neurological deficit.   Labs: Lab Results  Component Value Date   WBC 21.8* 07/05/2015   HGB 14.5 07/05/2015   HCT 46.1* 07/05/2015   MCV 96.4 07/05/2015   PLT 271 07/05/2015    Recent Labs Lab 07/05/15 0757  NA 139  K 4.6  CL 105  CO2 20*  BUN 30*  CREATININE 1.15*  CALCIUM 9.1  PROT 5.8*  BILITOT 0.6  ALKPHOS 69  ALT 63*  AST 79*  GLUCOSE 259*   No results found for: PTT Lab Results  Component Value Date   INR 0.98 07/05/2015   No results found for: CKTOTAL, CKMB, CKMBINDEX, TROPONINI   Lab Results  Component Value Date   CHOL 207* 01/30/2015   CHOL 215* 12/29/2012   CHOL 177 01/20/2012   Lab Results  Component Value Date   HDL 41.20 01/30/2015   HDL 43.80 12/29/2012   HDL 37.10* 01/20/2012   Lab Results  Component Value Date   LDLCALC 143* 01/30/2015   LDLCALC 131* 01/20/2012   LDLCALC 151* 12/23/2007   Lab Results  Component Value Date   TRIG 114.0 01/30/2015   TRIG 79.0 12/29/2012   TRIG 45.0  01/20/2012   Lab Results  Component Value Date   CHOLHDL 5 01/30/2015   CHOLHDL 5 12/29/2012   CHOLHDL 5 01/20/2012   Lab Results  Component Value Date   LDLDIRECT 155.4 12/29/2012   LDLDIRECT 154.5 01/31/2011   LDLDIRECT 166.1 02/26/2010      Radiology:  Dg Chest Port 1 View  07/05/2015  CLINICAL DATA:  Shortness of breath with worsening today EXAM: PORTABLE CHEST 1 VIEW COMPARISON:  06/24/2015 FINDINGS: Hyperinflation again noted. No infiltrate or pleural effusion. No pulmonary edema. Bony thorax is stable. IMPRESSION: No active disease.  Hyperinflation again noted. Electronically Signed   By: Lahoma Crocker M.D.   On: 07/05/2015 08:10    EKG:  At 7:44 AM 07/05/15 Sinus tachycardia 115 bpm, poor R-wave progression/QS pattern V1 through V4 with ST elevation in V3 through V6 and ?reciprocal change. Repeat  EKG with slower heart rate at 99 bpm reveals resolving ST segment changes.    Sinclair Grooms 07/05/2015 8:42 AM

## 2015-07-05 NOTE — H&P (View-Only) (Signed)
Name: Kim [name] is a 60 y.o. female Admit date: 07/05/2015 Referring Physician:  Marye Round, MD Primary Physician:  Sindy Guadeloupe Primary Cardiologist:  Angelena Form  Reason for Consultation:  CP, Abn ECG and elevate troponin  ASSESSMENT:  1. 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 R/O primary coronary thrombotic event. Prior h/o stress CM and clean coronary arteries 2015 in similar situation.  2. Acute respiratory distress with respiratory failure in patient with h/o COPD and recent acute decompensation 3. Hyperlipidemia 4. CAD based on coronary calcification on CT scan. Cath 2015 with luminal irregularities but no hemodynamically/angiographically significant lesions. Anterio and lateral WMA raised concern for Stress cardiomyopathy. 5. Leukocytosis 6. Metabolic acidosis 7. Elevated liver enzymes.  PLAN: 1. IV heparin 2. Trend markers 3. When COPD exacerbation/respiratory distress is better, will need cath today. 4. IM to admit. I'm concerned about elevated WBC and h/o COPD. No PNA noted however. 5. Critical care/Pulmonry opinion since this is a bounce for COPD exacerbation.   HPI: 60 year old with history of COPD and heavy prior smoking history. The patient has coronary artery calcification by CT and luminal irregularities on coronary angiography done in [facility], [place]. At that time, under very similar circumstances, the patient presented with acute respiratory distress also complaining of chest pressure much more severe than today's presentation. Coronary angiography ultimately demonstrated three-vessel coronary luminal irregularities but no angiographically significant disease. 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  2 weeks ago the patient was hospitalized here with respiratory distress. She was treated for COPD exacerbation. She was discharged on 06/28/2015. Today she suddenly  developed dyspnea and chest pressure. This was similarly 2015 but the chest pressure not as severe. She is now on BiPAP and is already starting to breathe better with decreasing chest pressure. Initial EKG demonstrated 1 mm of ST elevation, V4 through V6. Repeat EKG with slower heart rate and with improved respiratory status reveals resolving ST segment changes. EKG is clearly different than admitting EKG 06/24/15.    PMH:   Past Medical History  Diagnosis Date  . C8 RADICULOPATHY 06/05/2009  . COPD 08/31/2007  . DEPRESSION 11/19/2006  . DYSLIPIDEMIA 03/12/2009  . 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 OSTEOARTHRITIS 06/05/2009  . PARESTHESIA 08/24/2007  . TOBACCO ABUSE 01/25/2010    PSH:   Past Surgical History  Procedure Laterality Date  . Appendectomy  1969  . Cholecystectomy  1989  . Exploratory laparotomy  1969  . Collapse lung  1990   Allergies:  Penicillins Prior to Admit Meds:   (Not in a hospital admission) Fam HX:    Family History  Problem Relation Age of Onset  . Cancer Mother     lung  . Heart attack Mother   . Hypertension Sister   . Multiple sclerosis Brother   . Multiple sclerosis Other   . Alcohol abuse Other   . Arthritis Other   . Diabetes Other   . Kidney disease Other   . Cancer Other     lung,ovarian,skin, uterine  . Stroke Other   . Heart disease Other   . Melanoma Other   . Diabetes Sister   . Rheum arthritis Sister   . Thyroid disease Sister   . Colon cancer Neg Hx   . Heart attack Maternal Uncle   . Heart attack Other     NEICE  . Heart attack Other     NEPHEW  . Hypertension Brother   . Stroke  Maternal Grandmother    Social HX:    Social History   Social History  . Marital Status: Married    Spouse Name: N/A  . Number of Children: 2  . Years of Education: N/A   Occupational History  . Patient care aide-sits with elderly lady    Social History Main Topics  . Smoking status: Former Smoker -- 1.50 packs/day for 30 years    Types: Cigarettes      Quit date: 12/21/2013  . Smokeless tobacco: Never Used  . Alcohol Use: No  . Drug Use: No  . Sexual Activity: Not on file   Other Topics Concern  . Not on file   Social History Narrative     Review of Systems: The patient has had cough since discharge from the hospital. She denies chills and fever. She states compliance with recently prescribed medical regimen.. All other systems are negative.  Physical Exam: Blood pressure 121/93, pulse 115, resp. rate 28, SpO2 100 %. Weight change:    Lying 45 with BiPAP. Respiratory rate is 16 Skin is pale Neck veins are difficult to assess but appear to be flat Chest reveals diminished breath sounds anteriorly and posteriorly. Cardiac exam is difficult due to support apparatus noise. No obvious rub or murmur is heard. Abdomen is soft. No bruits are heard. Extremities reveal no peripheral edema. Radial pulses are 2+ bilateral The patient is awake, conversant, and exhibits no evidence of focal neurological deficit.   Labs: Lab Results  Component Value Date   WBC 21.8* 07/05/2015   HGB 14.5 07/05/2015   HCT 46.1* 07/05/2015   MCV 96.4 07/05/2015   PLT 271 07/05/2015    Recent Labs Lab 07/05/15 0757  NA 139  K 4.6  CL 105  CO2 20*  BUN 30*  CREATININE 1.15*  CALCIUM 9.1  PROT 5.8*  BILITOT 0.6  ALKPHOS 69  ALT 63*  AST 79*  GLUCOSE 259*   No results found for: PTT Lab Results  Component Value Date   INR 0.98 07/05/2015   No results found for: CKTOTAL, CKMB, CKMBINDEX, TROPONINI   Lab Results  Component Value Date   CHOL 207* 01/30/2015   CHOL 215* 12/29/2012   CHOL 177 01/20/2012   Lab Results  Component Value Date   HDL 41.20 01/30/2015   HDL 43.80 12/29/2012   HDL 37.10* 01/20/2012   Lab Results  Component Value Date   LDLCALC 143* 01/30/2015   LDLCALC 131* 01/20/2012   LDLCALC 151* 12/23/2007   Lab Results  Component Value Date   TRIG 114.0 01/30/2015   TRIG 79.0 12/29/2012   TRIG 45.0  01/20/2012   Lab Results  Component Value Date   CHOLHDL 5 01/30/2015   CHOLHDL 5 12/29/2012   CHOLHDL 5 01/20/2012   Lab Results  Component Value Date   LDLDIRECT 155.4 12/29/2012   LDLDIRECT 154.5 01/31/2011   LDLDIRECT 166.1 02/26/2010      Radiology:  Dg Chest Port 1 View  07/05/2015  CLINICAL DATA:  Shortness of breath with worsening today EXAM: PORTABLE CHEST 1 VIEW COMPARISON:  06/24/2015 FINDINGS: Hyperinflation again noted. No infiltrate or pleural effusion. No pulmonary edema. Bony thorax is stable. IMPRESSION: No active disease.  Hyperinflation again noted. Electronically Signed   By: Lahoma Crocker M.D.   On: 07/05/2015 08:10    EKG:  At 7:44 AM 07/05/15 Sinus tachycardia 115 bpm, poor R-wave progression/QS pattern V1 through V4 with ST elevation in V3 through V6 and ?reciprocal change. Repeat  EKG with slower heart rate at 99 bpm reveals resolving ST segment changes.    Sinclair Grooms 07/05/2015 8:42 AM

## 2015-07-05 NOTE — Consult Note (Signed)
PULMONARY / CRITICAL CARE MEDICINE   Name: Kim [name] MRN: [id number] DOB: 04-07-1956    ADMISSION DATE:  07/05/2015 CONSULTATION DATE: 07/05/15  REFERRING MD:  EDP  CHIEF COMPLAINT:  Increased shortness of breath  HISTORY OF PRESENT ILLNESS:  Kim [name] is a 60 year old female with PMH significant for COPD,Dyslipidemia, depression,tobacco abuse(quit 15 months ago),solitary pulmonary nodule.  She was admitted to [facility] from 3/19- 3/23 for COPD exacerbation and influenza A. She is followed by Dr. Lamonte Sakai in the clinic.  Patient developed sudden shortness of breath and some heaviness of chest on 3/29.  The heaviness of the chest was not exertional,constant and non radiating.  Patient denies any cough,fever,chills, vomiting, headache.  Patient has ocasional diaphoresis but she was more diaphoretic than usual.  She has some productive sputum but mostly clear. She presents to [facility] ED on 3/30. She was placed on Bipap.  ABG-7.32/46.4/211/24.1, Troponin -0.10, BUN-30,Cr-1.15,AST-79,ALT-63, WBC-21.8.  Patient received albuterol neb treatment in the ED.   PAST MEDICAL HISTORY :  She  has a past medical history of C8 RADICULOPATHY (06/05/2009); COPD (08/31/2007); DEPRESSION (11/19/2006); DYSLIPIDEMIA (03/12/2009); NEPHROLITHIASIS (01/05/2008); OSTEOARTHRITIS (06/05/2009); PARESTHESIA (08/24/2007); and TOBACCO ABUSE (01/25/2010).  PAST SURGICAL HISTORY: She  has past surgical history that includes Appendectomy (1969); Cholecystectomy (1989); Exploratory laparotomy (1969); and collapse lung (1990).  Allergies  Allergen Reactions  . Penicillins Hives    Has patient had a PCN reaction causing immediate rash, facial/tongue/throat swelling, SOB or lightheadedness with hypotension: Yes Has patient had a PCN reaction causing severe rash involving mucus membranes or skin necrosis: No Has patient had a PCN reaction that required hospitalization No Has patient had a PCN reaction occurring within the last 10 years:  No If all of the above answers are "NO", then may proceed with Cephalosporin use.     No current facility-administered medications on file prior to encounter.   Current Outpatient Prescriptions on File Prior to Encounter  Medication Sig  . albuterol (PROVENTIL HFA;VENTOLIN HFA) 108 (90 BASE) MCG/ACT inhaler Inhale 2 puffs into the lungs every 4 (four) hours as needed for wheezing or shortness of breath.  Marland Kitchen albuterol (PROVENTIL) (2.5 MG/3ML) 0.083% nebulizer solution Take 3 mLs (2.5 mg total) by nebulization every 2 (two) hours as needed for wheezing.  Marland Kitchen ALPRAZolam (XANAX) 0.5 MG tablet TAKE 1/2 TO 1 TABLET BY MOUTH 3 TIMES A DAY AS NEEDED  . budesonide-formoterol (SYMBICORT) 160-4.5 MCG/ACT inhaler Inhale 2 puffs into the lungs 2 (two) times daily.  . chlorpheniramine-HYDROcodone (TUSSIONEX) 10-8 MG/5ML SUER Take 5 mLs by mouth every 12 (twelve) hours.  . Cholecalciferol (VITAMIN D3) 1000 UNITS CAPS Take 1 capsule by mouth 2 (two) times daily.   Marland Kitchen Dextromethorphan-Guaifenesin (MUCINEX DM MAXIMUM STRENGTH) 60-1200 MG TB12 Take 1,200 mg by mouth 2 (two) times daily.  Marland Kitchen HYDROcodone-acetaminophen (NORCO) 7.5-325 MG tablet Take 1 tablet by mouth every 6 (six) hours as needed for moderate pain.  Marland Kitchen nystatin (MYCOSTATIN) 100000 UNIT/ML suspension Take 5 mLs (500,000 Units total) by mouth 4 (four) times daily. (Patient taking differently: Take 5 mLs by mouth daily as needed. )  . oseltamivir (TAMIFLU) 75 MG capsule Take 1 capsule (75 mg total) by mouth 2 (two) times daily.  Marland Kitchen PAXIL CR 25 MG 24 hr tablet Take 1 tablet by mouth  every morning  . pravastatin (PRAVACHOL) 20 MG tablet Take 1 tablet (20 mg total) by mouth every evening.  . predniSONE (DELTASONE) 20 MG tablet 3 tablets 3 days, 2 tablets3 days, 1.5 tablets  3 days, 1 tablet 3 days, half a tablet 3 days, then discontinue  . SPIRIVA HANDIHALER 18 MCG inhalation capsule Inhale contents of 1 capsule daily    FAMILY HISTORY:  Her indicated  that her mother is deceased. She indicated that her father is deceased. She indicated that all of her three sisters are alive. She indicated that her brother is alive.   SOCIAL HISTORY: She  reports that she quit smoking about 18 months ago. Her smoking use included Cigarettes. She has a 45 pack-year smoking history. She has never used smokeless tobacco. She reports that she does not drink alcohol or use illicit drugs.  REVIEW OF SYSTEMS:   Unable to obtain as the patient was on BiPAP  SUBJECTIVE:  Unable to obtain  VITAL SIGNS: BP 121/93 mmHg  Pulse 115  Resp 28  SpO2 100%  HEMODYNAMICS:    VENTILATOR SETTINGS: Vent Mode:  [-] BIPAP FiO2 (%):  [40 %] 40 % Set Rate:  [12 bmp] 12 bmp PEEP:  [6 cmH20] 6 cmH20  INTAKE / OUTPUT:    PHYSICAL EXAMINATION: General: white female on BiPAP Neuro:  Awake, alert, oriented, follows command HEENT:  Atraumatic, Normocephalic, no discharge Cardiovascular: RRR, no MRG noted Lungs: diminished breath sounds, no rales, no rhonchi present Abdomen: soft, nontender Musculoskeletal:  No obvious joint deformity or inflammation noted Skin - Grossly intact  LABS:  BMET  Recent Labs Lab 07/05/15 0757  NA 139  K 4.6  CL 105  CO2 20*  BUN 30*  CREATININE 1.15*  GLUCOSE 259*    Electrolytes  Recent Labs Lab 07/05/15 0757  CALCIUM 9.1    CBC  Recent Labs Lab 07/05/15 0757  WBC 21.8*  HGB 14.5  HCT 46.1*  PLT 271    Coag's  Recent Labs Lab 07/05/15 0757  APTT 24  INR 0.98    Sepsis Markers No results for input(s): LATICACIDVEN, PROCALCITON, O2SATVEN in the last 168 hours.  ABG No results for input(s): PHART, PCO2ART, PO2ART in the last 168 hours.  Liver Enzymes  Recent Labs Lab 07/05/15 0757  AST 79*  ALT 63*  ALKPHOS 69  BILITOT 0.6  ALBUMIN 3.5    Cardiac Enzymes No results for input(s): TROPONINI, PROBNP in the last 168 hours.  Glucose No results for input(s): GLUCAP in the last 168  hours.  Imaging Dg Chest Port 1 View  07/05/2015  CLINICAL DATA:  Shortness of breath with worsening today EXAM: PORTABLE CHEST 1 VIEW COMPARISON:  06/24/2015 FINDINGS: Hyperinflation again noted. No infiltrate or pleural effusion. No pulmonary edema. Bony thorax is stable. IMPRESSION: No active disease.  Hyperinflation again noted. Electronically Signed   By: Lahoma Crocker M.D.   On: 07/05/2015 08:10     STUDIES:  none  CULTURES: None  ANTIBIOTICS:  3/30 Vancomycin>>  3/30 Imipenem>> SIGNIFICANT EVENTS: none  LINES/TUBES: none  DISCUSSION: 60 year old female with the history of COPD, dyslipidemia,depression, tobacco abuse, solitary pulmonary nodule now presents with chest tightness and COPD exacerbation.  ASSESSMENT / PLAN:  PULMONARY A: Acute on Chronic respiratory failuredue to COPD exacerbation Hx of tobacco abuse Hx of Influenza( was hospitalized from 3/19 -3/23)  P:   Continue Bipap to keep sats >90% Routine ABG CXR in am Continue Albuterol/budenoside /ipratropium Continue Solomedrol  Pulmonary toiletting Flutter valve  CARDIOVASCULAR A:  Coronary artery disease based on CT scan. (Cath 2015 with luminal irregularities but no hemodynamically/angiographically significant lesions. Anterio and lateral WMA raised concern for Stress cardiomyopathy) Hyperlipidemia P:  I/V heparin per cardiology Trend Cardiac markers Cardiology following the patient  RENAL A:   AKI P:   Trend chemistry  Replace electolytes      GASTROINTESTINAL A:   No active issues P:   NPO Protonix for GIP  HEMATOLOGIC A:   No active issues P:  SCDS Heparin for dvt prophylaxis  INFECTIOUS A:   ? HCAP   Leukocytosis probably due to steroids P:   Vancomycin/primaxin ,D/C once cultures comes back negative Follow cultures CBC in am  ENDOCRINE A:   Hyperglycemia probably due to steroids P:   Check BG intermittently  NEUROLOGIC A:   Hx of depression  P:   Hold  Paxil    Bincy Varughese,AG-ACNP Pulmonary & Critical Care [facility] [place] Pager: [phone]  Attending Note:  60 year old with PMH of COPD who was recently discharged from the hospital for a COPD exacerbation last week.  At home, she is not O2 dependent but uses spiriva, albuterol and symbicort.  She was also on a prednisone taper.  On exam, prolonged exp phase even on BiPAP.  I reviewed CXR myself, hyperinflation but no infiltrate.  Discussed with TRH-MD and EDP.  Acute hypercarbic respiratory failure:  - BiPAP to assist with work of breathing as ordered.  - Treat COPD exacerbation.  - Maintain NPO incase needs intubation.  - Ok to admit to SDU.  Hypoxemia:  - Supplemental O2 for target sat of 88-92%.  - Will need an ambulatory desaturation study prior to discharge in case needs home O2.  COPD exacerbation:  - Solumedrol 60 mg IV q6 hours.  - Budesonide.  - Duonebs scheduled.  - PRN albuterol.  Bronchitis:  - Primaxin.  - Vancomycin.  - Pan cultures.  - F/u on cultures and limit abx accordingly.  Positive Troponins:  - IV heparin.  - Cards following.  Leukocytosis: infection vs steroids, was recently hospitalized.  - Primaxin.  - Vanc.  - Cultures.  - CBC in AM.  Patient seen and examined, agree with above note.  I dictated the care and orders written for this patient under my direction.  Rush Farmer, MD [phone]  07/05/2015, 9:09 AM

## 2015-07-06 ENCOUNTER — Inpatient Hospital Stay (HOSPITAL_COMMUNITY): Payer: 59

## 2015-07-06 ENCOUNTER — Encounter (HOSPITAL_COMMUNITY): Payer: Self-pay | Admitting: Cardiology

## 2015-07-06 ENCOUNTER — Inpatient Hospital Stay: Payer: 59 | Admitting: Adult Health

## 2015-07-06 DIAGNOSIS — I5021 Acute systolic (congestive) heart failure: Secondary | ICD-10-CM

## 2015-07-06 DIAGNOSIS — I429 Cardiomyopathy, unspecified: Secondary | ICD-10-CM

## 2015-07-06 LAB — POCT I-STAT 3, ART BLOOD GAS (G3+)
ACID-BASE DEFICIT: 2 mmol/L (ref 0.0–2.0)
BICARBONATE: 22.7 meq/L (ref 20.0–24.0)
O2 Saturation: 89 %
PH ART: 7.401 (ref 7.350–7.450)
TCO2: 24 mmol/L (ref 0–100)
pCO2 arterial: 36.6 mmHg (ref 35.0–45.0)
pO2, Arterial: 56 mmHg — ABNORMAL LOW (ref 80.0–100.0)

## 2015-07-06 LAB — CBC
HCT: 38.3 % (ref 36.0–46.0)
HEMOGLOBIN: 12.6 g/dL (ref 12.0–15.0)
MCH: 31.5 pg (ref 26.0–34.0)
MCHC: 32.9 g/dL (ref 30.0–36.0)
MCV: 95.8 fL (ref 78.0–100.0)
Platelets: 234 10*3/uL (ref 150–400)
RBC: 4 MIL/uL (ref 3.87–5.11)
RDW: 15.6 % — ABNORMAL HIGH (ref 11.5–15.5)
WBC: 20.4 10*3/uL — AB (ref 4.0–10.5)

## 2015-07-06 LAB — BASIC METABOLIC PANEL
ANION GAP: 10 (ref 5–15)
BUN: 22 mg/dL — AB (ref 6–20)
CHLORIDE: 106 mmol/L (ref 101–111)
CO2: 23 mmol/L (ref 22–32)
Calcium: 8.7 mg/dL — ABNORMAL LOW (ref 8.9–10.3)
Creatinine, Ser: 1 mg/dL (ref 0.44–1.00)
GFR calc Af Amer: >60 mL/min (ref >60)
GFR calc non Af Amer: 60 mL/min — ABNORMAL LOW (ref >60)
Glucose, Bld: 185 mg/dL — ABNORMAL HIGH (ref 65–99)
POTASSIUM: 4.7 mmol/L (ref 3.5–5.1)
SODIUM: 139 mmol/L (ref 135–145)

## 2015-07-06 LAB — GLUCOSE, CAPILLARY
GLUCOSE-CAPILLARY: 122 mg/dL — AB (ref 65–99)
GLUCOSE-CAPILLARY: 121 mg/dL — AB (ref 65–99)
GLUCOSE-CAPILLARY: 102 mg/dL — AB (ref 65–99)
GLUCOSE-CAPILLARY: 131 mg/dL — AB (ref 65–99)
Glucose-Capillary: 126 mg/dL — ABNORMAL HIGH (ref 65–99)

## 2015-07-06 LAB — MAGNESIUM: MAGNESIUM: 2.2 mg/dL (ref 1.7–2.4)

## 2015-07-06 LAB — ECHOCARDIOGRAM COMPLETE
Height: 64 in
WEIGHTICAEL: 2057.6 [oz_av]

## 2015-07-06 LAB — TROPONIN I: TROPONIN I: 0.57 ng/mL — AB (ref <0.031)

## 2015-07-06 LAB — PHOSPHORUS: PHOSPHORUS: 2 mg/dL — AB (ref 2.5–4.6)

## 2015-07-06 LAB — HIV ANTIBODY (ROUTINE TESTING W REFLEX): HIV Screen 4th Generation wRfx: NONREACTIVE

## 2015-07-06 MED ORDER — BISOPROLOL FUMARATE 5 MG PO TABS
2.5000 mg | ORAL_TABLET | Freq: Every day | ORAL | Status: DC
  Administered 2015-07-06: 2.5 mg via ORAL
  Filled 2015-07-06: qty 1

## 2015-07-06 MED ORDER — METOPROLOL TARTRATE 12.5 MG HALF TABLET: 12.5000 mg | ORAL_TABLET | Freq: Two times a day (BID) | ORAL | Status: DC

## 2015-07-06 MED ORDER — PANTOPRAZOLE SODIUM 40 MG PO TBEC
40.0000 mg | DELAYED_RELEASE_TABLET | Freq: Every day | ORAL | Status: DC
  Administered 2015-07-07 – 2015-07-08 (×2): 40 mg via ORAL
  Filled 2015-07-06 (×2): qty 1

## 2015-07-06 MED ORDER — LISINOPRIL 5 MG PO TABS
5.0000 mg | ORAL_TABLET | Freq: Every day | ORAL | Status: DC
  Administered 2015-07-06 – 2015-07-08 (×3): 5 mg via ORAL
  Filled 2015-07-06 (×3): qty 1

## 2015-07-06 MED ORDER — HYDROCOD POLST-CPM POLST ER 10-8 MG/5ML PO SUER: 5.0000 mL | Freq: Two times a day (BID) | ORAL | Status: DC | PRN

## 2015-07-06 MED ORDER — METHYLPREDNISOLONE SODIUM SUCC 40 MG IJ SOLR
40.0000 mg | Freq: Two times a day (BID) | INTRAMUSCULAR | Status: DC
  Administered 2015-07-06 – 2015-07-07 (×2): 40 mg via INTRAVENOUS
  Filled 2015-07-06 (×2): qty 1

## 2015-07-06 MED ORDER — ACETAMINOPHEN 650 MG RE SUPP: 325.0000 mg | Freq: Four times a day (QID) | RECTAL | Status: DC | PRN

## 2015-07-06 MED ORDER — DM-GUAIFENESIN ER 30-600 MG PO TB12: 2.0000 | ORAL_TABLET | Freq: Two times a day (BID) | ORAL | Status: DC | PRN

## 2015-07-06 MED ORDER — ASPIRIN EC 81 MG PO TBEC
81.0000 mg | DELAYED_RELEASE_TABLET | Freq: Every day | ORAL | Status: DC
  Administered 2015-07-06 – 2015-07-08 (×3): 81 mg via ORAL
  Filled 2015-07-06 (×3): qty 1

## 2015-07-06 MED ORDER — ACETAMINOPHEN 500 MG PO TABS: 500.0000 mg | ORAL_TABLET | Freq: Four times a day (QID) | ORAL | Status: DC | PRN

## 2015-07-06 MED ORDER — FUROSEMIDE 10 MG/ML IJ SOLN
40.0000 mg | Freq: Once | INTRAMUSCULAR | Status: AC
  Administered 2015-07-06: 40 mg via INTRAVENOUS
  Filled 2015-07-06: qty 4

## 2015-07-06 MED ORDER — LEVALBUTEROL HCL 0.63 MG/3ML IN NEBU
0.6300 mg | INHALATION_SOLUTION | Freq: Four times a day (QID) | RESPIRATORY_TRACT | Status: DC
Start: 2015-07-06 — End: 2015-07-07
  Administered 2015-07-06 – 2015-07-07 (×6): 0.63 mg via RESPIRATORY_TRACT
  Filled 2015-07-06 (×6): qty 3

## 2015-07-06 MED ORDER — FUROSEMIDE 10 MG/ML IJ SOLN: 40.0000 mg | Freq: Every day | INTRAMUSCULAR | Status: DC

## 2015-07-06 MED ORDER — IPRATROPIUM BROMIDE 0.02 % IN SOLN
0.5000 mg | Freq: Four times a day (QID) | RESPIRATORY_TRACT | Status: DC
Start: 2015-07-06 — End: 2015-07-07
  Administered 2015-07-06 – 2015-07-07 (×6): 0.5 mg via RESPIRATORY_TRACT
  Filled 2015-07-06 (×6): qty 2.5

## 2015-07-06 MED ORDER — LEVALBUTEROL HCL 0.63 MG/3ML IN NEBU: 0.6300 mg | INHALATION_SOLUTION | RESPIRATORY_TRACT | Status: DC | PRN

## 2015-07-06 MED FILL — Perflutren Lipid Microsphere IV Susp 1.1 MG/ML: INTRAVENOUS | Qty: 10 | Status: AC

## 2015-07-06 MED FILL — Verapamil HCl IV Soln 2.5 MG/ML: INTRAVENOUS | Qty: 2 | Status: AC

## 2015-07-06 NOTE — Consult Note (Signed)
PULMONARY / CRITICAL CARE MEDICINE   Name: Kim [name] MRN: [id number] DOB: 1955/09/02    ADMISSION DATE:  07/05/2015 CONSULTATION DATE: 07/05/15  REFERRING MD:  EDP  CHIEF COMPLAINT:  Increased shortness of breath  HISTORY OF PRESENT ILLNESS:  Kim [name] is a 60 year old female with PMH significant for COPD,Dyslipidemia, depression,tobacco abuse(quit 15 months ago),solitary pulmonary nodule.  She was admitted to [facility] from 3/19- 3/23 for COPD exacerbation and influenza A. She is followed by Dr. Lamonte Sakai in the clinic.  Patient developed sudden shortness of breath and some heaviness of chest on 3/29.  The heaviness of the chest was not exertional,constant and non radiating.  Patient denies any cough,fever,chills, vomiting, headache.  Patient has ocasional diaphoresis but she was more diaphoretic than usual.  She has some productive sputum but mostly clear. She presents to [facility] ED on 3/30. She was placed on Bipap.  ABG-7.32/46.4/211/24.1, Troponin -0.10, BUN-30,Cr-1.15,AST-79,ALT-63, WBC-21.8.  Patient received albuterol neb treatment in the ED.   SUBJECTIVE:  BiPaP weaned off on 3/30. Doing well. Less SOB.   VITAL SIGNS: BP 122/72 mmHg  Pulse 87  Temp(Src) 98.4 F (36.9 C) (Oral)  Resp 20  Ht 5\' 4"  (1.626 m)  Wt 128 lb 9.6 oz (58.333 kg)  BMI 22.06 kg/m2  SpO2 98%  HEMODYNAMICS:    VENTILATOR SETTINGS: Vent Mode:  [-] BIPAP FiO2 (%):  [40 %] 40 % Set Rate:  [12 bmp] 12 bmp PEEP:  [6 cmH20] 6 cmH20  INTAKE / OUTPUT: I/O last 3 completed shifts: In: 2410 [P.O.:360; I.V.:1500; IV Piggyback:550] Out: 200 [Urine:200]  PHYSICAL EXAMINATION: General: white female not in distress.  Neuro:  Awake, alert, oriented, follows command HEENT:  Atraumatic, Normocephalic, no discharge Cardiovascular: RRR, no MRG noted Lungs: diminished breath sounds, occasional bibasilar crackles Abdomen: soft, nontender Musculoskeletal:  No obvious joint deformity or inflammation noted Skin -  Grossly intact  LABS:  BMET  Recent Labs Lab 07/05/15 0757 07/06/15 0104  NA 139 139  K 4.6 4.7  CL 105 106  CO2 20* 23  BUN 30* 22*  CREATININE 1.15* 1.00  GLUCOSE 259* 185*    Electrolytes  Recent Labs Lab 07/05/15 0757 07/06/15 0104  CALCIUM 9.1 8.7*  MG  --  2.2  PHOS  --  2.0*    CBC  Recent Labs Lab 07/05/15 0757 07/06/15 0104  WBC 21.8* 20.4*  HGB 14.5 12.6  HCT 46.1* 38.3  PLT 271 234    Coag's  Recent Labs Lab 07/05/15 0757  APTT 24  INR 0.98    Sepsis Markers  Recent Labs Lab 07/05/15 1105  PROCALCITON <0.10    ABG  Recent Labs Lab 07/05/15 0915 07/06/15 0331  PHART 7.327* 7.401  PCO2ART 45.8* 36.6  PO2ART 209.0* 56.0*    Liver Enzymes  Recent Labs Lab 07/05/15 0757  AST 79*  ALT 63*  ALKPHOS 69  BILITOT 0.6  ALBUMIN 3.5    Cardiac Enzymes  Recent Labs Lab 07/05/15 1240 07/05/15 1811 07/06/15 0104  TROPONINI 0.53* 0.56* 0.57*    Glucose  Recent Labs Lab 07/05/15 1133 07/05/15 1628 07/05/15 2015 07/05/15 2333 07/06/15 0438 07/06/15 0812  GLUCAP 162* 137* 146* 186* 122* 126*    Imaging Dg Chest Port 1 View  07/06/2015  CLINICAL DATA:  Acute onset of respiratory failure. Initial encounter. EXAM: PORTABLE CHEST 1 VIEW COMPARISON:  Chest radiograph performed 07/05/2015 FINDINGS: The lungs are well-aerated and clear. There is no evidence of focal opacification, pleural effusion or pneumothorax.  The cardiomediastinal silhouette is within normal limits. No acute osseous abnormalities are seen. IMPRESSION: No acute cardiopulmonary process seen. Electronically Signed   By: Garald Balding M.D.   On: 07/06/2015 04:37     STUDIES:  Echo 3/31 EF 25-30%, Takotsubo  CULTURES: Blood 3/30 (-) MRSA 3/30 (-)  ANTIBIOTICS:  3/30 Vancomycin>> 3/31  3/30 Imipenem>>3/31  SIGNIFICANT EVENTS: 3/30 pt admitted. PCCM consulted for bipap management.  3/31 bipap weaned off. Clinically better. PCCM sign  off  LINES/TUBES: none  DISCUSSION: 60 year old female with the history of COPD, dyslipidemia,depression, tobacco abuse, solitary pulmonary nodule now presents with chest tightness and COPD exacerbation.  ASSESSMENT / PLAN:  PULMONARY A: Acute on Chronic respiratory failure due to COPD exacerbation Hx of tobacco abuse Hx of Influenza( was hospitalized from 3/19 -3/23)  P:   Bipap prn. Likely will need o2 on d/c.  Cont neb meds > pt on symbicort/spiriva as ioutpt.  IS Wean off pred over 1 week.  abx dc'd > no signs of infxn. CXR was clear.   CARDIOVASCULAR A:  Coronary artery disease based on CT scan. (Cath 2015 with luminal irregularities but no hemodynamically/angiographically significant lesions. Anterio and lateral WMA raised concern for Stress cardiomyopathy) Hyperlipidemia CHF/Tokotsubo physiology  P:  Cardiology following the patient On ace, lasix.   Pt to be transferred to tele. Clinically improved. Anticipate d/c in next 2-3 days. PCCM will sign off. Call back if with questions. Needs f/u with pulm as scheduled. Likely will need o2.   [place], MD 07/06/2015, 11:14 AM [place] [facility] Pager [phone] After 3 pm or if no answer, call [phone]

## 2015-07-06 NOTE — Progress Notes (Signed)
  Echocardiogram 2D Echocardiogram with definity 31mL has been performed.  Kim [name] M 07/06/2015, 9:55 AM

## 2015-07-06 NOTE — Progress Notes (Signed)
1615 pt transferred in from [facility] via bed  . Anxious and feeling very hot . Kept comfortable in bed with wet cold towel  On face . Felt better thereafter

## 2015-07-06 NOTE — Progress Notes (Signed)
OT Cancellation Note  Patient Details Name: Kim [name] MRN: [id number] DOB: 03-21-1956   Cancelled Treatment:    Reason Eval/Treat Not Completed: Patient not medically ready (Troponin elevated)  Vonita Moss   OTR/L Pager: [phone] Office: [phone] .  07/06/2015, 7:49 AM

## 2015-07-06 NOTE — Progress Notes (Signed)
PT Cancellation Note  Patient Details Name: Kim [name] MRN: [id number] DOB: Apr 13, 1955   Cancelled Treatment:    Reason Eval/Treat Not Completed: Medical issues which prohibited therapy. Pt's troponin is elevated and trending up. Pt awaiting heart cath. Will follow up as appropriate.   Colon Branch, SPT Colon Branch 07/06/2015, 7:48 AM

## 2015-07-06 NOTE — Progress Notes (Signed)
Report called to Neita Carp. Pt VSS and no c/o pain at this time.

## 2015-07-06 NOTE — Progress Notes (Addendum)
[place] TEAM 1 - Stepdown/ICU TEAM PROGRESS NOTE  TEMPIE DELAPUENTE [id number] DOB: [birth year] DOA: 07/05/2015 PCP: [name], MD  Admit HPI / Brief Narrative: 60 y.o. female with COPD last here a week prior to this admit with COPD exacerbation and influenza A. She was discharged on antibiotics, Tamiflu and a prednisone taper. Patient developed sudden onset of shortness of breath and chest heaviness. The heaviness was not exertional, nearly constant, and nonradiating.   HPI/Subjective: Pt is resting comfortably in bed.  She denies cp, sob, n/v, or abdom pain.  She does admit to severe DOE.    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 - Acute systolic CHF  EF [id number] via TTE 3/31 but no WMA - medical management - Cards following - gently diurese and follow - no exam findings to suggest severe volume overload at this time, but recent weight as low as 55kg - Cards added ACEi - add bisoprolol in setting of COPD Filed Weights   07/05/15 0932 07/06/15 0600  Weight: 57.607 kg (127 lb) 58.333 kg (128 lb 9.6 oz)   Hyperglycemia, steroid induced Controlled   Hyperlipidemia continue home statin  Mild transamininitis likley due to passive congestion due to CHF - recheck in AM  Depression  Code Status: FULL Family Communication: spoke w/ pt and signif other at bedside  Disposition Plan: transfer to CHF floor - educate - ambulate - assess O2 needs   Consultants: [facility]  PCCM  Procedures: 3/30 cardiac cath  3/31 TTE   Antibiotics: Imipenem 3/30 > 3/31 Vanc 3/30 > 3/31  DVT  prophylaxis: lovenox   Objective: Blood pressure 122/72, pulse 87, temperature 98.4 F (36.9 C), temperature source Oral, resp. rate 20, height 5\' 4"  (1.626 m), weight 58.333 kg (128 lb 9.6 oz), SpO2 98 %.  Intake/Output Summary (Last 24 hours) at 07/06/15 1009 Last data filed at 07/06/15 0800  Gross per 24 hour  Intake   2725 ml  Output    200 ml  Net   2525 ml   Exam: General: No acute respiratory distress at rest in bed  Lungs: mild diffuse crackles - no wheeze  Cardiovascular: Regular rate and rhythm without murmur gallop or rub normal S1 and S2 Abdomen: Nontender, nondistended, soft, bowel sounds positive, no rebound, no ascites, no appreciable mass Extremities: No significant cyanosis, clubbing, or edema bilateral lower extremities  Data Reviewed:  Basic Metabolic Panel:  Recent Labs Lab 07/05/15 0757 07/06/15 0104  NA 139 139  K 4.6 4.7  CL 105 106  CO2 20* 23  GLUCOSE 259* 185*  BUN 30* 22*  CREATININE 1.15* 1.00  CALCIUM 9.1 8.7*  MG  --  2.2  PHOS  --  2.0*    CBC:  Recent Labs Lab 07/05/15 0757 07/06/15 0104  WBC 21.8* 20.4*  HGB 14.5 12.6  HCT 46.1* 38.3  MCV 96.4 95.8  PLT 271 234    Liver Function Tests:  Recent Labs Lab 07/05/15 0757  AST 79*  ALT 63*  ALKPHOS 69  BILITOT 0.6  PROT 5.8*  ALBUMIN 3.5   Coags:  Recent Labs  Lab 07/05/15 0757  INR 0.98    Recent Labs Lab 07/05/15 0757  APTT 24    Cardiac Enzymes:  Recent Labs Lab 07/05/15 1240 07/05/15 1811 07/06/15 0104  TROPONINI 0.53* 0.56* 0.57*    CBG:  Recent Labs Lab 07/05/15 1628 07/05/15 2015 07/05/15 2333 07/06/15 0438 07/06/15 0812  GLUCAP 137* 146* 186* 122* 126*    Recent Results (from the past 240 hour(s))  MRSA PCR Screening     Status: None   Collection Time: 07/05/15  1:40 PM  Result Value Ref Range Status   MRSA by PCR NEGATIVE NEGATIVE Final    Comment:        The GeneXpert MRSA Assay (FDA approved for NASAL specimens only), is  one component of a comprehensive MRSA colonization surveillance program. It is not intended to diagnose MRSA infection nor to guide or monitor treatment for MRSA infections.      Studies:   Recent x-ray studies have been reviewed in detail by the Attending Physician  Scheduled Meds:  Scheduled Meds: . antiseptic oral rinse  7 mL Mouth Rinse BID  . aspirin EC  81 mg Oral Daily  . budesonide (PULMICORT) nebulizer solution  0.5 mg Nebulization BID  . chlorpheniramine-HYDROcodone  5 mL Oral Q12H  . dextromethorphan-guaiFENesin  2 tablet Oral BID  . enoxaparin (LOVENOX) injection  40 mg Subcutaneous Q24H  . furosemide  40 mg Intravenous Once  . imipenem-cilastatin  250 mg Intravenous 4 times per day  . insulin aspart  0-9 Units Subcutaneous 6 times per day  . ipratropium-albuterol  3 mL Nebulization BID  . lisinopril  5 mg Oral Daily  . methylPREDNISolone (SOLU-MEDROL) injection  60 mg Intravenous Q6H  . pantoprazole (PROTONIX) IV  40 mg Intravenous Q24H  . PARoxetine  25 mg Oral Daily  . pravastatin  20 mg Oral QPM  . sodium chloride flush  3 mL Intravenous Q12H  . sodium chloride flush  3 mL Intravenous Q12H  . vancomycin  750 mg Intravenous Q12H    Time spent on care of this patient: 35 mins   Lorrin Bodner T , MD   [facility] Office  [phone] Pager - Text Page per Shea Evans as per below:  On-Call/Text Page:      [email or web address]      password [email or web address]  If 7PM-7AM, please contact night-coverage [email or web address] Password [email or web address] 07/06/2015, 10:09 AM   LOS: 1 day

## 2015-07-06 NOTE — Progress Notes (Signed)
ABG collected  

## 2015-07-06 NOTE — Progress Notes (Signed)
     SUBJECTIVE: No chest pain. She does have dyspnea.    BP 122/72 mmHg  Pulse 87  Temp(Src) 98.4 F (36.9 C) (Oral)  Resp 20  Ht 5\' 4"  (1.626 m)  Wt 128 lb 9.6 oz (58.333 kg)  BMI 22.06 kg/m2  SpO2 98%  Intake/Output Summary (Last 24 hours) at 07/06/15 [id number] Last data filed at 07/06/15 0800  Gross per 24 hour  Intake   2725 ml  Output    200 ml  Net   2525 ml    PHYSICAL EXAM General: Well developed, well nourished, in no acute distress. Alert and oriented x 3.  Psych:  Good affect, responds appropriately Neck: No JVD. No masses noted.  Lungs: Bilateral  wheezes  Heart: RRR with no murmurs noted. Abdomen: Bowel sounds are present. Soft, non-tender.  Extremities: No lower extremity edema.   LABS: Basic Metabolic Panel:  Recent Labs  07/05/15 0757 07/06/15 0104  NA 139 139  K 4.6 4.7  CL 105 106  CO2 20* 23  GLUCOSE 259* 185*  BUN 30* 22*  CREATININE 1.15* 1.00  CALCIUM 9.1 8.7*  MG  --  2.2  PHOS  --  2.0*   CBC:  Recent Labs  07/05/15 0757 07/06/15 0104  WBC 21.8* 20.4*  HGB 14.5 12.6  HCT 46.1* 38.3  MCV 96.4 95.8  PLT 271 234   Cardiac Enzymes:  Recent Labs  07/05/15 1240 07/05/15 1811 07/06/15 0104  TROPONINI 0.53* 0.56* 0.57*   Current Meds: . antiseptic oral rinse  7 mL Mouth Rinse BID  . budesonide (PULMICORT) nebulizer solution  0.5 mg Nebulization BID  . chlorpheniramine-HYDROcodone  5 mL Oral Q12H  . dextromethorphan-guaiFENesin  2 tablet Oral BID  . enoxaparin (LOVENOX) injection  40 mg Subcutaneous Q24H  . imipenem-cilastatin  250 mg Intravenous 4 times per day  . insulin aspart  0-9 Units Subcutaneous 6 times per day  . ipratropium-albuterol  3 mL Nebulization BID  . methylPREDNISolone (SOLU-MEDROL) injection  60 mg Intravenous Q6H  . pantoprazole (PROTONIX) IV  40 mg Intravenous Q24H  . PARoxetine  25 mg Oral Daily  . pravastatin  20 mg Oral QPM  . sodium chloride flush  3 mL Intravenous Q12H  . sodium chloride flush   3 mL Intravenous Q12H  . vancomycin  750 mg Intravenous Q12H     ASSESSMENT AND PLAN: 60 year old with history of COPD and heavy prior smoking history admitted with chest pain and dyspnea. Prior treatment in [facility] for similar event with minimal CAD by cath and it was suspected to be stress induced cardiomyopathy. Unclear if LV dysfunctin resolved. 2 weeks ago the patient was hospitalized here with respiratory distress. She was treated for COPD exacerbation. She was discharged on 06/28/2015. On 07/05/15 she suddenly developed dyspnea and chest pressure. This was similarly 2015 but the chest pressure not as severe. Initial EKG demonstrated 1 mm of ST elevation, V4 through V6. Cath 07/05/15 with minimal CAD. LVEF 15-20% by LV gram.   1. 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 Will start [place] today.   2. Acute systolic CHF: LVEDP elevated on cath. She is 2500cc positive since admission. Lung fields with wheezes. Will give one time dose Lasix this am.   3. CAD:  Mild CAD by cath 07/05/15. Continue statin. Start ASA 81 mg daily  4. COPD exacerbation: Per primary team   Atreyu Mak  3/31/20179:09 AM

## 2015-07-07 DIAGNOSIS — J9601 Acute respiratory failure with hypoxia: Secondary | ICD-10-CM

## 2015-07-07 LAB — LIPID PANEL
CHOLESTEROL: 216 mg/dL — AB (ref 0–200)
HDL: 79 mg/dL (ref >40)
LDL Cholesterol: 95 mg/dL (ref 0–99)
TRIGLYCERIDES: 211 mg/dL — AB (ref <150)
Total CHOL/HDL Ratio: 2.7 RATIO
VLDL: 42 mg/dL — AB (ref 0–40)

## 2015-07-07 LAB — CBC
HCT: 39.3 % (ref 36.0–46.0)
HEMOGLOBIN: 12.5 g/dL (ref 12.0–15.0)
MCH: 30.3 pg (ref 26.0–34.0)
MCHC: 31.8 g/dL (ref 30.0–36.0)
MCV: 95.2 fL (ref 78.0–100.0)
PLATELETS: 253 10*3/uL (ref 150–400)
RBC: 4.13 MIL/uL (ref 3.87–5.11)
RDW: 15.7 % — ABNORMAL HIGH (ref 11.5–15.5)
WBC: 30.9 10*3/uL — ABNORMAL HIGH (ref 4.0–10.5)

## 2015-07-07 LAB — COMPREHENSIVE METABOLIC PANEL
ALBUMIN: 3 g/dL — AB (ref 3.5–5.0)
ALK PHOS: 59 U/L (ref 38–126)
ALT: 34 U/L (ref 14–54)
AST: 21 U/L (ref 15–41)
Anion gap: 8 (ref 5–15)
BILIRUBIN TOTAL: 0.5 mg/dL (ref 0.3–1.2)
BUN: 25 mg/dL — AB (ref 6–20)
CALCIUM: 8.8 mg/dL — AB (ref 8.9–10.3)
CO2: 25 mmol/L (ref 22–32)
CREATININE: 0.89 mg/dL (ref 0.44–1.00)
Chloride: 107 mmol/L (ref 101–111)
GFR calc Af Amer: >60 mL/min (ref >60)
GFR calc non Af Amer: >60 mL/min (ref >60)
GLUCOSE: 133 mg/dL — AB (ref 65–99)
Potassium: 4.3 mmol/L (ref 3.5–5.1)
SODIUM: 140 mmol/L (ref 135–145)
TOTAL PROTEIN: 5.4 g/dL — AB (ref 6.5–8.1)

## 2015-07-07 LAB — GLUCOSE, CAPILLARY
GLUCOSE-CAPILLARY: 92 mg/dL (ref 65–99)
Glucose-Capillary: 113 mg/dL — ABNORMAL HIGH (ref 65–99)
Glucose-Capillary: 98 mg/dL (ref 65–99)
Glucose-Capillary: 107 mg/dL — ABNORMAL HIGH (ref 65–99)

## 2015-07-07 LAB — BRAIN NATRIURETIC PEPTIDE: B NATRIURETIC PEPTIDE 5: 330.4 pg/mL — AB (ref 0.0–100.0)

## 2015-07-07 LAB — MAGNESIUM: Magnesium: 2.2 mg/dL (ref 1.7–2.4)

## 2015-07-07 MED ORDER — ATORVASTATIN CALCIUM 10 MG PO TABS: 10.0000 mg | ORAL_TABLET | Freq: Every day | ORAL | Status: DC

## 2015-07-07 MED ORDER — IPRATROPIUM BROMIDE 0.02 % IN SOLN
0.5000 mg | Freq: Two times a day (BID) | RESPIRATORY_TRACT | Status: DC
  Filled 2015-07-07: qty 2.5

## 2015-07-07 MED ORDER — PRAVASTATIN SODIUM 40 MG PO TABS
40.0000 mg | ORAL_TABLET | Freq: Every evening | ORAL | Status: DC
Start: 2015-07-07 — End: 2015-07-08
  Administered 2015-07-07 – 2015-07-08 (×2): 40 mg via ORAL
  Filled 2015-07-07 (×2): qty 1

## 2015-07-07 MED ORDER — K PHOS MONO-SOD PHOS DI & MONO 155-852-130 MG PO TABS
500.0000 mg | ORAL_TABLET | Freq: Two times a day (BID) | ORAL | Status: DC
  Administered 2015-07-07 – 2015-07-08 (×3): 500 mg via ORAL
  Filled 2015-07-07 (×5): qty 2

## 2015-07-07 MED ORDER — PREDNISONE 50 MG PO TABS
60.0000 mg | ORAL_TABLET | Freq: Every day | ORAL | Status: DC
  Administered 2015-07-08: 60 mg via ORAL
  Filled 2015-07-07: qty 1

## 2015-07-07 MED ORDER — LEVALBUTEROL HCL 0.63 MG/3ML IN NEBU
0.6300 mg | INHALATION_SOLUTION | Freq: Two times a day (BID) | RESPIRATORY_TRACT | Status: DC
  Filled 2015-07-07: qty 3

## 2015-07-07 MED ORDER — FUROSEMIDE 10 MG/ML IJ SOLN
80.0000 mg | Freq: Every day | INTRAMUSCULAR | Status: DC
  Administered 2015-07-07 – 2015-07-08 (×2): 80 mg via INTRAVENOUS
  Filled 2015-07-07 (×2): qty 8

## 2015-07-07 MED ORDER — CARVEDILOL 3.125 MG PO TABS
3.1250 mg | ORAL_TABLET | Freq: Two times a day (BID) | ORAL | Status: DC
  Administered 2015-07-07 – 2015-07-08 (×3): 3.125 mg via ORAL
  Filled 2015-07-07 (×3): qty 1

## 2015-07-07 NOTE — Progress Notes (Signed)
OT Cancellation Note  Patient Details Name: Kim [name] MRN: [id number] DOB: 1955/05/20   Cancelled Treatment:    Reason Eval/Treat Not Completed: OT screened, no needs identified, will sign off  Benito Mccreedy OTR/L [id number] 07/07/2015, 4:16 PM

## 2015-07-07 NOTE — Progress Notes (Signed)
PT Cancellation Note  Patient Details Name: Kim [name] MRN: [id number] DOB: Sep 09, 1955   Cancelled Treatment:    Reason Eval/Treat Not Completed: PT screened, no needs identified, will sign off. Patient reports she has been walking in room with no loss of balance or dizziness.  Patient reports she does not need PT.  Will sign off. Thank you,   Shanna Cisco 07/07/2015, 10:18 AM

## 2015-07-07 NOTE — Progress Notes (Signed)
Kim [name] [id number] DOB: 1955/05/18 DOA: 07/05/2015 PCP: [name], MD  Brief narrative:  60 y/o  Severe COPD [45 pk yr smoking h/o],  Spirometry >> FEv1 59%, small airways 27 % [03/2010] solit pulm nodule 26mm Collapse Lung 1990  LBP managed by Dr. [name] bipolar cath 2015 in [facility] on  Admit with CP- Seen by cardiology EF 25-30% via TTE 3/31 but no WMA  Patient is doing well, she states that her issues started in October 2016 when she was diagnosed with a type of flulike symptom and was treated with antibiotics She tells me that when she went to her family's home in [place] and was staying in a trailer park with her camper, she developed sudden shortness of breath and COPD exacerbation-she was hospitalized at a small hospital in [place] and stayed there for 8 days-it was thought that she had cardiomyopathy related to "stress" because of her COPD-she had a cardiac cath at that time which did not show any culprit lesion She then tells me that she started feeling short of breath again prior to last hospital visit when she was diagnosed with the flu-she completed that treatment for flu as an outpatient and on 06/28/15 was discharged home-on 07/02/15 she started feeling short of breath again and ultimately was admitted to the hospital 07/05/15-critical care consulted and saw the patient and cardiology saw the patient as well given elevated troponins. She had a workup as dictated above and is currently transferred to hospitalist service as of 07/06/15    Past medical history-As per Problem list Chart reviewed as below-   Consultants:  cards  Procedures:  S/p cath 3/30 noting no critical stenosis   Antibiotics:  nad   Subjective    alert pleasant oriented no apparent distress Tolerating diet No fever no chills No blurred vision no double vision Ambulating to the restroom without shortness of breath and also without any fever or  chills    Objective    Interim History:   Telemetry:    Objective: Filed Vitals:   07/07/15 0048 07/07/15 0517 07/07/15 0838 07/07/15 0839  BP: 113/66 129/69    Pulse: 78 69    Temp:  98.2 F (36.8 C)    TempSrc:  Oral    Resp: 18 18    Height:      Weight:  57.425 kg (126 lb 9.6 oz)    SpO2: 98% 94% 94% 94%    Intake/Output Summary (Last 24 hours) at 07/07/15 1143 Last data filed at 07/07/15 0912  Gross per 24 hour  Intake   1100 ml  Output   2900 ml  Net  -1800 ml    Exam:  EOMI NCAT no pallor no icterus cannot appreciate any JVD Poor dentition Mallampati 1 S1-S2 no murmur rub or gallop, no rales no rhonchi no adventitious sounds and lungs Abdomen soft nontender No lower extremity edema no rash   Data Reviewed: Basic Metabolic Panel:  Recent Labs Lab 07/05/15 0757 07/06/15 0104 07/07/15 0420 07/07/15 0826  NA 139 139 140  --   K 4.6 4.7 4.3  --   CL 105 106 107  --   CO2 20* 23 25  --   GLUCOSE 259* 185* 133*  --   BUN 30* 22* 25*  --   CREATININE 1.15* 1.00 0.89  --   CALCIUM 9.1 8.7* 8.8*  --   MG  --  2.2  --  2.2  PHOS  --  2.0*  --   --    Liver Function Tests:  Recent Labs Lab 07/05/15 0757 07/07/15 0420  AST 79* 21  ALT 63* 34  ALKPHOS 69 59  BILITOT 0.6 0.5  PROT 5.8* 5.4*  ALBUMIN 3.5 3.0*   No results for input(s): LIPASE, AMYLASE in the last 168 hours. No results for input(s): AMMONIA in the last 168 hours. CBC:  Recent Labs Lab 07/05/15 0757 07/06/15 0104 07/07/15 0420  WBC 21.8* 20.4* 30.9*  HGB 14.5 12.6 12.5  HCT 46.1* 38.3 39.3  MCV 96.4 95.8 95.2  PLT 271 234 253   Cardiac Enzymes:  Recent Labs Lab 07/05/15 1240 07/05/15 1811 07/06/15 0104  TROPONINI 0.53* 0.56* 0.57*   BNP: Invalid input(s): POCBNP CBG:  Recent Labs Lab 07/06/15 0812 07/06/15 1214 07/06/15 1650 07/06/15 2149 07/07/15 0630  GLUCAP 126* 121* 102* 131* 113*    Recent Results (from the past 240 hour(s))  Culture, blood  (routine x 2) Call MD if unable to obtain prior to antibiotics being given     Status: None (Preliminary result)   Collection Time: 07/05/15 11:05 AM  Result Value Ref Range Status   Specimen Description BLOOD RIGHT HAND  Final   Special Requests BOTTLES DRAWN AEROBIC AND ANAEROBIC 10CC  Final   Culture NO GROWTH < 24 HOURS  Final   Report Status PENDING  Incomplete  Culture, blood (routine x 2) Call MD if unable to obtain prior to antibiotics being given     Status: None (Preliminary result)   Collection Time: 07/05/15 11:15 AM  Result Value Ref Range Status   Specimen Description BLOOD LEFT ANTECUBITAL  Final   Special Requests BOTTLES DRAWN AEROBIC AND ANAEROBIC 10CC  Final   Culture NO GROWTH < 24 HOURS  Final   Report Status PENDING  Incomplete  MRSA PCR Screening     Status: None   Collection Time: 07/05/15  1:40 PM  Result Value Ref Range Status   MRSA by PCR NEGATIVE NEGATIVE Final    Comment:        The GeneXpert MRSA Assay (FDA approved for NASAL specimens only), is one component of a comprehensive MRSA colonization surveillance program. It is not intended to diagnose MRSA infection nor to guide or monitor treatment for MRSA infections.      Studies:              All Imaging reviewed and is as per above notation   Scheduled Meds: . antiseptic oral rinse  7 mL Mouth Rinse BID  . aspirin EC  81 mg Oral Daily  . budesonide (PULMICORT) nebulizer solution  0.5 mg Nebulization BID  . carvedilol  3.125 mg Oral BID WC  . enoxaparin (LOVENOX) injection  40 mg Subcutaneous Q24H  . furosemide  80 mg Intravenous Daily  . imipenem-cilastatin  250 mg Intravenous 4 times per day  . ipratropium  0.5 mg Nebulization Q6H  . levalbuterol  0.63 mg Nebulization Q6H  . lisinopril  5 mg Oral Daily  . methylPREDNISolone (SOLU-MEDROL) injection  40 mg Intravenous Q12H  . pantoprazole  40 mg Oral Q1200  . PARoxetine  25 mg Oral Daily  . phosphorus  500 mg Oral BID  . pravastatin  40  mg Oral QPM   Continuous Infusions:    Assessment/Plan:  Chest pain / ST elevation on EKG / Mild CAD S/p cath 3/30 noting no critical stenosis - Cards following - cont statin - start ASA Unlikely component of microangiopathic ischemia-  defer further management including nitrates and hydralazine to cardiology  Acute hypoxic respiratory failure Much improved - now appears to have been primarily due to acute CHF exac - no clear inidication of acute infection - d/c abx - wean O2 as able - gently diurese with Lasix 80 twice a day-rest as per cardiology  Non-ischemic cardiomyopathy - possible stress induced cardiomyopathy - Acute systolic CHF  EF [id number] via TTE 3/31 but no WMA - medical management - Cards following - gently diurese and follow - no exam findings to suggest severe volume overload at this time, but recent weight as low as 55kg - Cards added ACEi - add bisoprolol in setting of COPD Filed Weights   07/05/15 0932 07/06/15 0600  Weight: 57.607 kg (127 lb) 58.333 kg (128 lb 9.6 oz)   Hyperglycemia, steroid induced Controlled  CBG ranging 92-133 Transitioning to by mouth prednisone 60 mg 07/07/15  Hyperlipidemia Uptitrate to Pravachol 40 mg daily as increased risk of ASCVD as is a smoker  Mild transamininitis likley due to passive congestion due to CHF -  -Currently completely resolved  Depression      Verneita Griffes, MD  [facility] Pager [phone] 07/07/2015, 11:43 AM    LOS: 2 days

## 2015-07-07 NOTE — Progress Notes (Addendum)
Subjective: Feeling better  Still with SOB when get up  NO CP   Objective: Filed Vitals:   07/07/15 0048 07/07/15 0517 07/07/15 0838 07/07/15 0839  BP: 113/66 129/69    Pulse: 78 69    Temp:  98.2 F (36.8 C)    TempSrc:  Oral    Resp: 18 18    Height:      Weight:  126 lb 9.6 oz (57.425 kg)    SpO2: 98% 94% 94% 94%   Weight change: -1 lb 6.4 oz (-0.635 kg)  Intake/Output Summary (Last 24 hours) at 07/07/15 0845 Last data filed at 07/07/15 0659  Gross per 24 hour  Intake   1100 ml  Output   3750 ml  Net  -2650 ml   1/O  -975 General: Alert, awake, oriented x3, in no acute distress Neck:  JVP is normal Heart: Regular rate and rhythm, without murmurs, rubs, gallops.  Lungs: Wheezes and rales on exaom   Exemities:  No edema.   Neuro: Grossly intact, nonfocal.  Tele:  SR    Lab Results: Results for orders placed or performed during the hospital encounter of 07/05/15 (from the past 24 hour(s))  Glucose, capillary     Status: Abnormal   Collection Time: 07/06/15 12:14 PM  Result Value Ref Range   Glucose-Capillary 121 (H) 65 - 99 mg/dL  Glucose, capillary     Status: Abnormal   Collection Time: 07/06/15  4:50 PM  Result Value Ref Range   Glucose-Capillary 102 (H) 65 - 99 mg/dL   Comment 1 Notify RN   Glucose, capillary     Status: Abnormal   Collection Time: 07/06/15  9:49 PM  Result Value Ref Range   Glucose-Capillary 131 (H) 65 - 99 mg/dL  Comprehensive metabolic panel     Status: Abnormal   Collection Time: 07/07/15  4:20 AM  Result Value Ref Range   Sodium 140 135 - 145 mmol/L   Potassium 4.3 3.5 - 5.1 mmol/L   Chloride 107 101 - 111 mmol/L   CO2 25 22 - 32 mmol/L   Glucose, Bld 133 (H) 65 - 99 mg/dL   BUN 25 (H) 6 - 20 mg/dL   Creatinine, Ser 0.89 0.44 - 1.00 mg/dL   Calcium 8.8 (L) 8.9 - 10.3 mg/dL   Total Protein 5.4 (L) 6.5 - 8.1 g/dL   Albumin 3.0 (L) 3.5 - 5.0 g/dL   AST 21 15 - 41 U/L   ALT 34 14 - 54 U/L   Alkaline Phosphatase 59 38 - 126 U/L     Total Bilirubin 0.5 0.3 - 1.2 mg/dL   GFR calc non Af Amer >60 >60 mL/min   GFR calc Af Amer >60 >60 mL/min   Anion gap 8 5 - 15  CBC     Status: Abnormal   Collection Time: 07/07/15  4:20 AM  Result Value Ref Range   WBC 30.9 (H) 4.0 - 10.5 K/uL   RBC 4.13 3.87 - 5.11 MIL/uL   Hemoglobin 12.5 12.0 - 15.0 g/dL   HCT 39.3 36.0 - 46.0 %   MCV 95.2 78.0 - 100.0 fL   MCH 30.3 26.0 - 34.0 pg   MCHC 31.8 30.0 - 36.0 g/dL   RDW 15.7 (H) 11.5 - 15.5 %   Platelets 253 150 - 400 K/uL  Lipid panel     Status: Abnormal   Collection Time: 07/07/15  4:20 AM  Result Value Ref Range   Cholesterol 216 (H)  0 - 200 mg/dL   Triglycerides 211 (H) <150 mg/dL   HDL 79 >40 mg/dL   Total CHOL/HDL Ratio 2.7 RATIO   VLDL 42 (H) 0 - 40 mg/dL   LDL Cholesterol 95 0 - 99 mg/dL  Glucose, capillary     Status: Abnormal   Collection Time: 07/07/15  6:30 AM  Result Value Ref Range   Glucose-Capillary 113 (H) 65 - 99 mg/dL    Studies/Results: No results found.  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  CAD  Mild at cath Recomm ASA 81 mg  Continue pravastatin INcrease to 40    3  COPD exacerbatoin  Per IM  Still with wheeze  LOS: 2 days   Kim [name] 07/07/2015, 8:45 AM

## 2015-07-08 LAB — COMPREHENSIVE METABOLIC PANEL
ALBUMIN: 3.2 g/dL — AB (ref 3.5–5.0)
ALK PHOS: 57 U/L (ref 38–126)
ALT: 28 U/L (ref 14–54)
ALT: 29 U/L (ref 14–54)
ANION GAP: 10 (ref 5–15)
AST: 16 U/L (ref 15–41)
AST: 19 U/L (ref 15–41)
Albumin: 3.7 g/dL (ref 3.5–5.0)
Alkaline Phosphatase: 62 U/L (ref 38–126)
Anion gap: 16 — ABNORMAL HIGH (ref 5–15)
BILIRUBIN TOTAL: 1 mg/dL (ref 0.3–1.2)
BUN: 33 mg/dL — ABNORMAL HIGH (ref 6–20)
BUN: 38 mg/dL — AB (ref 6–20)
CALCIUM: 9 mg/dL (ref 8.9–10.3)
CHLORIDE: 102 mmol/L (ref 101–111)
CHLORIDE: 98 mmol/L — AB (ref 101–111)
CO2: 28 mmol/L (ref 22–32)
CO2: 27 mmol/L (ref 22–32)
CREATININE: 1.09 mg/dL — AB (ref 0.44–1.00)
Calcium: 9.3 mg/dL (ref 8.9–10.3)
Creatinine, Ser: 0.86 mg/dL (ref 0.44–1.00)
GFR calc Af Amer: >60 mL/min (ref >60)
GFR calc non Af Amer: >60 mL/min (ref >60)
GFR, EST NON AFRICAN AMERICAN: 54 mL/min — AB (ref >60)
GLUCOSE: 99 mg/dL (ref 65–99)
Glucose, Bld: 177 mg/dL — ABNORMAL HIGH (ref 65–99)
POTASSIUM: 3.8 mmol/L (ref 3.5–5.1)
POTASSIUM: 3.9 mmol/L (ref 3.5–5.1)
SODIUM: 140 mmol/L (ref 135–145)
Sodium: 141 mmol/L (ref 135–145)
TOTAL PROTEIN: 6.4 g/dL — AB (ref 6.5–8.1)
Total Bilirubin: 0.8 mg/dL (ref 0.3–1.2)
Total Protein: 5.7 g/dL — ABNORMAL LOW (ref 6.5–8.1)

## 2015-07-08 LAB — CBC
HCT: 41.8 % (ref 36.0–46.0)
HEMATOCRIT: 46.7 % — AB (ref 36.0–46.0)
HEMOGLOBIN: 13.3 g/dL (ref 12.0–15.0)
Hemoglobin: 15.5 g/dL — ABNORMAL HIGH (ref 12.0–15.0)
MCH: 30 pg (ref 26.0–34.0)
MCH: 31.4 pg (ref 26.0–34.0)
MCHC: 31.8 g/dL (ref 30.0–36.0)
MCHC: 33.2 g/dL (ref 30.0–36.0)
MCV: 94.4 fL (ref 78.0–100.0)
MCV: 94.7 fL (ref 78.0–100.0)
PLATELETS: 283 10*3/uL (ref 150–400)
PLATELETS: 314 10*3/uL (ref 150–400)
RBC: 4.43 MIL/uL (ref 3.87–5.11)
RBC: 4.93 MIL/uL (ref 3.87–5.11)
RDW: 15.4 % (ref 11.5–15.5)
RDW: 15.4 % (ref 11.5–15.5)
WBC: 20.1 10*3/uL — AB (ref 4.0–10.5)
WBC: 17.1 10*3/uL — AB (ref 4.0–10.5)

## 2015-07-08 LAB — GLUCOSE, CAPILLARY
GLUCOSE-CAPILLARY: 85 mg/dL (ref 65–99)
Glucose-Capillary: 117 mg/dL — ABNORMAL HIGH (ref 65–99)

## 2015-07-08 MED ORDER — ASPIRIN 81 MG PO TBEC: 81.0000 mg | DELAYED_RELEASE_TABLET | Freq: Every day | ORAL | Status: AC

## 2015-07-08 MED ORDER — LISINOPRIL 5 MG PO TABS: 5.0000 mg | ORAL_TABLET | Freq: Every day | ORAL | Status: DC

## 2015-07-08 MED ORDER — CARVEDILOL 3.125 MG PO TABS: 3.1250 mg | ORAL_TABLET | Freq: Two times a day (BID) | ORAL | Status: DC

## 2015-07-08 MED ORDER — PREDNISONE 20 MG PO TABS: 40.0000 mg | ORAL_TABLET | Freq: Every day | ORAL | Status: DC

## 2015-07-08 MED ORDER — FUROSEMIDE 40 MG PO TABS: 40.0000 mg | ORAL_TABLET | Freq: Every day | ORAL | Status: DC

## 2015-07-08 NOTE — Progress Notes (Signed)
Alert and oriented, denies pain, v/s stable. D/c instruction explain and given to patent, pt. Verbalized understanding. D/c pt. Home with oxygen per order.

## 2015-07-08 NOTE — Progress Notes (Signed)
SATURATION QUALIFICATIONS: (This note is used to comply with regulatory documentation for home oxygen)  Patient Saturations on Room Air at Rest =94 %  Patient Saturations on Room Air while Ambulating =88%  Patient Saturations on 2 Liters of oxygen while Ambulating = 97%  Please briefly explain why patient needs home oxygen:CHF

## 2015-07-08 NOTE — Progress Notes (Signed)
CM received call from RN stating pt needed home O2.  CM called [employer] rep, Tiffany to please arrange for authorization.  CM called [employer] DME rep, Merry Proud to please deliver the home O2 tank to room so pt can discharge.

## 2015-07-08 NOTE — Progress Notes (Signed)
Subjective: Breathing is better  No CP   Objective: Filed Vitals:   07/07/15 2100 07/08/15 0527 07/08/15 0912 07/08/15 1249  BP: 108/65  115/52 123/70  Pulse: 72 69 81 71  Temp: 98 F (36.7 C) 98.3 F (36.8 C)  98.3 F (36.8 C)  TempSrc: Oral Oral  Oral  Resp: 16 16  18   Height:      Weight:  122 lb 3.2 oz (55.43 kg)    SpO2: 93% 96%  92%   Weight change: -3 lb 6.4 oz (-1.542 kg)  Intake/Output Summary (Last 24 hours) at 07/08/15 1324 Last data filed at 07/08/15 1047  Gross per 24 hour  Intake   1360 ml  Output   1300 ml  Net     60 ml   Net neg 1.5 L   General: Alert, awake, oriented x3, in no acute distress Neck:  JVP is normal Heart: Regular rate and rhythm, without murmurs, rubs, gallops.  Lungs: Clear to auscultation.  No rales or wheezes. Exemities:  No edema.   Neuro: Grossly intact, nonfocal.   Lab Results: Results for orders placed or performed during the hospital encounter of 07/05/15 (from the past 24 hour(s))  Glucose, capillary     Status: None   Collection Time: 07/07/15  4:36 PM  Result Value Ref Range   Glucose-Capillary 98 65 - 99 mg/dL   Comment 1 Notify RN   Glucose, capillary     Status: Abnormal   Collection Time: 07/07/15  9:11 PM  Result Value Ref Range   Glucose-Capillary 107 (H) 65 - 99 mg/dL  CBC     Status: Abnormal   Collection Time: 07/08/15  3:40 AM  Result Value Ref Range   WBC 20.1 (H) 4.0 - 10.5 K/uL   RBC 4.43 3.87 - 5.11 MIL/uL   Hemoglobin 13.3 12.0 - 15.0 g/dL   HCT 41.8 36.0 - 46.0 %   MCV 94.4 78.0 - 100.0 fL   MCH 30.0 26.0 - 34.0 pg   MCHC 31.8 30.0 - 36.0 g/dL   RDW 15.4 11.5 - 15.5 %   Platelets 283 150 - 400 K/uL  Comprehensive metabolic panel     Status: Abnormal   Collection Time: 07/08/15  3:40 AM  Result Value Ref Range   Sodium 140 135 - 145 mmol/L   Potassium 3.8 3.5 - 5.1 mmol/L   Chloride 102 101 - 111 mmol/L   CO2 28 22 - 32 mmol/L   Glucose, Bld 99 65 - 99 mg/dL   BUN 33 (H) 6 - 20 mg/dL   Creatinine, Ser 0.86 0.44 - 1.00 mg/dL   Calcium 9.0 8.9 - 10.3 mg/dL   Total Protein 5.7 (L) 6.5 - 8.1 g/dL   Albumin 3.2 (L) 3.5 - 5.0 g/dL   AST 16 15 - 41 U/L   ALT 28 14 - 54 U/L   Alkaline Phosphatase 57 38 - 126 U/L   Total Bilirubin 0.8 0.3 - 1.2 mg/dL   GFR calc non Af Amer >60 >60 mL/min   GFR calc Af Amer >60 >60 mL/min   Anion gap 10 5 - 15  Glucose, capillary     Status: None   Collection Time: 07/08/15  7:03 AM  Result Value Ref Range   Glucose-Capillary 85 65 - 99 mg/dL  Glucose, capillary     Status: Abnormal   Collection Time: 07/08/15 12:09 PM  Result Value Ref Range   Glucose-Capillary 117 (H) 65 - 99 mg/dL  Comment 1 Notify RN     Studies/Results: No results found.  Medications:Reviewecd   @PROBHOSP @  1  NICM  Takotsubos  LVEF 15 to 20%  Volume status improved from yesterday  Would switch t olasix 40  Continue other meds    2  CAD  Mild at cath ASA 81    3  COPD    Note plans for d/c  Will make sure pt has close outpt f/u in cardiology    LOS: 3 days   Dorris Carnes 07/08/2015, 1:24 PM

## 2015-07-08 NOTE — Discharge Summary (Addendum)
Physician Discharge Summary  Kim [name] [id number] DOB: Nov 15, 1955 DOA: 07/05/2015  PCP: [name], MD  Admit date: 07/05/2015 Discharge date: 07/08/2015  Time spent: 35 minutes  Recommendations for Outpatient Follow-up:  1. Note MEd schanges-Lisinopril, Bisporolol-->coreg, ASA 325-->81--see mar 2. Dr. Angelena Form to see in f/u--his office will call patient  Discharge Diagnoses:  Active Problems:   COPD exacerbation (HCC)   Acute respiratory distress (HCC)   Acute respiratory failure (HCC)   Acute coronary syndrome (HCC)   Acute combined systolic and diastolic congestive heart failure ([place])   Hyperglycemia   HLD (hyperlipidemia)   Discharge Condition: improved  Diet recommendation: hh low salt fluid restricted  Filed Weights   07/06/15 1617 07/07/15 0517 07/08/15 0527  Weight: 56.972 kg (125 lb 9.6 oz) 57.425 kg (126 lb 9.6 oz) 55.43 kg (122 lb 3.2 oz)    History of present illness:    60 y/o  Severe COPD [45 pk yr smoking h/o],  Spirometry >> FEv1 59%, small airways 27 % [03/2010] solit pulm nodule 32mm Collapse Lung 1990  LBP managed by Dr. [name] bipolar cath 2015 in [facility] on  Admit with CP- Seen by cardiology EF 25-30% via TTE 3/31 but no WMA  Patient is doing well, she states that her issues started in October 2016 when she was diagnosed with a type of flulike symptom and was treated with antibiotics She tells me that when she went to her family's home in [place] and was staying in a trailer park with her camper, she developed sudden shortness of breath and COPD exacerbation-she was hospitalized at a small hospital in [place] and stayed there for 8 days-it was thought that she had cardiomyopathy related to "stress" because of her COPD-she had a cardiac cath at that time which did not show any culprit lesion She then tells me that she started feeling short of breath again prior to last hospital visit when she was diagnosed with  the flu-she completed that treatment for flu as an outpatient and on 06/28/15 was discharged home-on 07/02/15 she started feeling short of breath again and ultimately was admitted to the hospital 07/05/15-critical care consulted and saw the patient and cardiology saw the patient as well given elevated troponins. She had a workup as dictated above and is currently transferred to hospitalist service as of 07/06/15   Hospital Course:   Chest pain / ST elevation on EKG / Mild CAD S/p cath 3/30 noting no critical stenosis - Cards following - cont statin - start ASA Unlikely component of microangiopathic ischemia- defer further management  As an OP to cardiology  Acute hypoxic respiratory failure Much improved - now appears to have been primarily due to acute CHF exac  - no clear inidication of acute infection - d/c abx - wean O2 as able  - gently diurese with Lasix 80 twice a day in hospital -home dose 40 daily per Cardiology opn day of d/c home -rest as per cardiology  Non-ischemic cardiomyopathy - possible stress induced cardiomyopathy  - Acute systolic CHF  EF 123XX123 via TTE 3/31 but no WMA - medical management - Cards following - - Cards added ACEi  - added coreg in setting of COPD  Hyperglycemia, steroid induced Controlled  CBG ranging 85-107- Transitioning to by mouth prednisone 60 mg 07/07/15-->transitioned to prednisone 40 for 10 days then stop  Hyperlipidemia Uptitrate to Pravachol 40 mg daily as increased risk of ASCVD as is a smoker  Mild transamininitis likley due to passive  congestion due to CHF -  -Currently completely resolved  Depression    Consultants:  cards  Procedures:  S/p cath 3/30 noting no critical stenosis   Antibiotics:  nad   Discharge Exam: Filed Vitals:   07/08/15 0912 07/08/15 1249  BP: 115/52 123/70  Pulse: 81 71  Temp:  98.3 F (36.8 C)  Resp:  18    General: eomi ncat Cardiovascular: s1 s2 no m/r/g Respiratory: clear no  addedsound, no rales no rhonchi  Discharge Instructions   Discharge Instructions    Diet - low sodium heart healthy    Complete by:  As directed      Discharge instructions    Complete by:  As directed   Look at meds carefully Follow up with Dr. Angelena Form Need labs in 1 week Take are Weigh urself daily If weight up 2-3 pounds over 24 hours, would recommned extra lasix dosing     Increase activity slowly    Complete by:  As directed           Current Discharge Medication List    START taking these medications   Details  carvedilol (COREG) 3.125 MG tablet Take 1 tablet (3.125 mg total) by mouth 2 (two) times daily with a meal. Qty: 60 tablet, Refills: 0    furosemide (LASIX) 40 MG tablet Take 1 tablet (40 mg total) by mouth daily. Qty: 30 tablet, Refills: 0    lisinopril (PRINIVIL,ZESTRIL) 5 MG tablet Take 1 tablet (5 mg total) by mouth daily. Qty: 30 tablet, Refills: 0    predniSONE (DELTASONE) 20 MG tablet Take 2 tablets (40 mg total) by mouth daily before breakfast. Qty: 20 tablet, Refills: 0      CONTINUE these medications which have CHANGED   Details  aspirin EC 81 MG EC tablet Take 1 tablet (81 mg total) by mouth daily.      CONTINUE these medications which have NOT CHANGED   Details  albuterol (PROVENTIL HFA;VENTOLIN HFA) 108 (90 BASE) MCG/ACT inhaler Inhale 2 puffs into the lungs every 4 (four) hours as needed for wheezing or shortness of breath.    ALPRAZolam (XANAX) 0.5 MG tablet TAKE 1/2 TO 1 TABLET BY MOUTH 3 TIMES A DAY AS NEEDED Qty: 90 tablet, Refills: 2    budesonide-formoterol (SYMBICORT) 160-4.5 MCG/ACT inhaler Inhale 2 puffs into the lungs 2 (two) times daily. Qty: 1 Inhaler, Refills: 5    chlorpheniramine-HYDROcodone (TUSSIONEX) 10-8 MG/5ML SUER Take 5 mLs by mouth every 12 (twelve) hours. Qty: 140 mL, Refills: 0    Cholecalciferol (VITAMIN D3) 1000 UNITS CAPS Take 1 capsule by mouth 2 (two) times daily.  Refills: 0     Dextromethorphan-Guaifenesin (MUCINEX DM MAXIMUM STRENGTH) 60-1200 MG TB12 Take 1,200 mg by mouth 2 (two) times daily.    HYDROcodone-acetaminophen (NORCO) 7.5-325 MG tablet Take 1 tablet by mouth every 6 (six) hours as needed for moderate pain. Qty: 90 tablet, Refills: 0    nystatin (MYCOSTATIN) 100000 UNIT/ML suspension Take 5 mLs (500,000 Units total) by mouth 4 (four) times daily. Qty: 60 mL, Refills: 0    PAXIL CR 25 MG 24 hr tablet Take 1 tablet by mouth  every morning Qty: 90 tablet, Refills: 1    pravastatin (PRAVACHOL) 20 MG tablet Take 1 tablet (20 mg total) by mouth every evening. Qty: 90 tablet, Refills: 3    SPIRIVA HANDIHALER 18 MCG inhalation capsule Inhale contents of 1 capsule daily Refills: 1      STOP taking these medications  albuterol (PROVENTIL) (2.5 MG/3ML) 0.083% nebulizer solution      oseltamivir (TAMIFLU) 75 MG capsule        Allergies  Allergen Reactions  . Penicillins Hives    Has patient had a PCN reaction causing immediate rash, facial/tongue/throat swelling, SOB or lightheadedness with hypotension: Yes Has patient had a PCN reaction causing severe rash involving mucus membranes or skin necrosis: No Has patient had a PCN reaction that required hospitalization No Has patient had a PCN reaction occurring within the last 10 years: No If all of the above answers are "NO", then may proceed with Cephalosporin use.    Follow-up Information    Follow up with [facility], MD In 2 weeks.   Specialty:  Cardiology   Why:  they will call you   Contact information:   [place] [place] [address] [phone]        The results of significant diagnostics from this hospitalization (including imaging, microbiology, ancillary and laboratory) are listed below for reference.    Significant Diagnostic Studies: Dg Chest 2 View  06/24/2015  CLINICAL DATA:  Shortness of breath and cough EXAM: CHEST  2 VIEW COMPARISON:   Chest radiograph December 29, 2012 and chest CT November 22, 2014 FINDINGS: Lungs remain hyperexpanded with slight bibasilar scarring. There is no edema or consolidation. Heart size and pulmonary vascularity are within normal limits. No adenopathy. Small nodular opacities seen on prior chest CT are not appreciable by radiography. No bone lesions. IMPRESSION: Lungs hyperexpanded with mild bibasilar scarring. No edema or consolidation. Electronically Signed   By: Lowella Grip III M.D.   On: 06/24/2015 11:32   Dg Chest Port 1 View  07/06/2015  CLINICAL DATA:  Acute onset of respiratory failure. Initial encounter. EXAM: PORTABLE CHEST 1 VIEW COMPARISON:  Chest radiograph performed 07/05/2015 FINDINGS: The lungs are well-aerated and clear. There is no evidence of focal opacification, pleural effusion or pneumothorax. The cardiomediastinal silhouette is within normal limits. No acute osseous abnormalities are seen. IMPRESSION: No acute cardiopulmonary process seen. Electronically Signed   By: Garald Balding M.D.   On: 07/06/2015 04:37   Dg Chest Port 1 View  07/05/2015  CLINICAL DATA:  Shortness of breath with worsening today EXAM: PORTABLE CHEST 1 VIEW COMPARISON:  06/24/2015 FINDINGS: Hyperinflation again noted. No infiltrate or pleural effusion. No pulmonary edema. Bony thorax is stable. IMPRESSION: No active disease.  Hyperinflation again noted. Electronically Signed   By: Lahoma Crocker M.D.   On: 07/05/2015 08:10    Microbiology: Recent Results (from the past 240 hour(s))  Culture, blood (routine x 2) Call MD if unable to obtain prior to antibiotics being given     Status: None (Preliminary result)   Collection Time: 07/05/15 11:05 AM  Result Value Ref Range Status   Specimen Description BLOOD RIGHT HAND  Final   Special Requests BOTTLES DRAWN AEROBIC AND ANAEROBIC 10CC  Final   Culture NO GROWTH 2 DAYS  Final   Report Status PENDING  Incomplete  Culture, blood (routine x 2) Call MD if unable to  obtain prior to antibiotics being given     Status: None (Preliminary result)   Collection Time: 07/05/15 11:15 AM  Result Value Ref Range Status   Specimen Description BLOOD LEFT ANTECUBITAL  Final   Special Requests BOTTLES DRAWN AEROBIC AND ANAEROBIC 10CC  Final   Culture NO GROWTH 2 DAYS  Final   Report Status PENDING  Incomplete  MRSA PCR Screening  Status: None   Collection Time: 07/05/15  1:40 PM  Result Value Ref Range Status   MRSA by PCR NEGATIVE NEGATIVE Final    Comment:        The GeneXpert MRSA Assay (FDA approved for NASAL specimens only), is one component of a comprehensive MRSA colonization surveillance program. It is not intended to diagnose MRSA infection nor to guide or monitor treatment for MRSA infections.      Labs: Basic Metabolic Panel:  Recent Labs Lab 07/05/15 0757 07/06/15 0104 07/07/15 0420 07/07/15 0826 07/08/15 0340  NA 139 139 140  --  140  K 4.6 4.7 4.3  --  3.8  CL 105 106 107  --  102  CO2 20* 23 25  --  28  GLUCOSE 259* 185* 133*  --  99  BUN 30* 22* 25*  --  33*  CREATININE 1.15* 1.00 0.89  --  0.86  CALCIUM 9.1 8.7* 8.8*  --  9.0  MG  --  2.2  --  2.2  --   PHOS  --  2.0*  --   --   --    Liver Function Tests:  Recent Labs Lab 07/05/15 0757 07/07/15 0420 07/08/15 0340  AST 79* 21 16  ALT 63* 34 28  ALKPHOS 69 59 57  BILITOT 0.6 0.5 0.8  PROT 5.8* 5.4* 5.7*  ALBUMIN 3.5 3.0* 3.2*   No results for input(s): LIPASE, AMYLASE in the last 168 hours. No results for input(s): AMMONIA in the last 168 hours. CBC:  Recent Labs Lab 07/05/15 0757 07/06/15 0104 07/07/15 0420 07/08/15 0340  WBC 21.8* 20.4* 30.9* 20.1*  HGB 14.5 12.6 12.5 13.3  HCT 46.1* 38.3 39.3 41.8  MCV 96.4 95.8 95.2 94.4  PLT 271 234 253 283   Cardiac Enzymes:  Recent Labs Lab 07/05/15 1240 07/05/15 1811 07/06/15 0104  TROPONINI 0.53* 0.56* 0.57*   BNP: BNP (last 3 results)  Recent Labs  07/05/15 1240 07/07/15 0953  BNP 124.6*  330.4*    ProBNP (last 3 results) No results for input(s): PROBNP in the last 8760 hours.  CBG:  Recent Labs Lab 07/07/15 1203 07/07/15 1636 07/07/15 2111 07/08/15 0703 07/08/15 1209  GLUCAP 92 98 107* 85 117*       Signed:  Nita Sells MD   [facility] 07/08/2015, 2:06 PM    [name] [id number] DOB: [birth year] DOA: 07/05/2015 PCP: [name], MD  Brief narrative:      Subjective      Objective    Interim History:   Telemetry:    Objective: Filed Vitals:   07/07/15 1953 07/07/15 2100 07/08/15 0527 07/08/15 0912  BP:  108/65  115/52  Pulse: 86 72 69 81  Temp:  98 F (36.7 C) 98.3 F (36.8 C)   TempSrc:  Oral Oral   Resp: 20 16 16    Height:      Weight:   55.43 kg (122 lb 3.2 oz)   SpO2: 94% 93% 96%     Intake/Output Summary (Last 24 hours) at 07/08/15 1022 Last data filed at 07/08/15 0900  Gross per 24 hour  Intake   1120 ml  Output   3000 ml  Net  -1880 ml    Exam:  EOMI NCAT no pallor no icterus cannot appreciate any JVD Poor dentition Mallampati 1 S1-S2 no murmur rub or gallop, no rales no rhonchi no adventitious sounds and lungs Abdomen soft nontender No lower extremity edema no rash  Data Reviewed: Basic Metabolic Panel:  Recent Labs Lab 07/05/15 0757 07/06/15 0104 07/07/15 0420 07/07/15 0826 07/08/15 0340  NA 139 139 140  --  140  K 4.6 4.7 4.3  --  3.8  CL 105 106 107  --  102  CO2 20* 23 25  --  28  GLUCOSE 259* 185* 133*  --  99  BUN 30* 22* 25*  --  33*  CREATININE 1.15* 1.00 0.89  --  0.86  CALCIUM 9.1 8.7* 8.8*  --  9.0  MG  --  2.2  --  2.2  --   PHOS  --  2.0*  --   --   --    Liver Function Tests:  Recent Labs Lab 07/05/15 0757 07/07/15 0420 07/08/15 0340  AST 79* 21 16  ALT 63* 34 28  ALKPHOS 69 59 57  BILITOT 0.6 0.5 0.8  PROT 5.8* 5.4* 5.7*  ALBUMIN 3.5 3.0* 3.2*   No results for input(s): LIPASE, AMYLASE in the last 168 hours. No results for  input(s): AMMONIA in the last 168 hours. CBC:  Recent Labs Lab 07/05/15 0757 07/06/15 0104 07/07/15 0420 07/08/15 0340  WBC 21.8* 20.4* 30.9* 20.1*  HGB 14.5 12.6 12.5 13.3  HCT 46.1* 38.3 39.3 41.8  MCV 96.4 95.8 95.2 94.4  PLT 271 234 253 283   Cardiac Enzymes:  Recent Labs Lab 07/05/15 1240 07/05/15 1811 07/06/15 0104  TROPONINI 0.53* 0.56* 0.57*   BNP: Invalid input(s): POCBNP CBG:  Recent Labs Lab 07/07/15 0630 07/07/15 1203 07/07/15 1636 07/07/15 2111 07/08/15 0703  GLUCAP 113* 92 98 107* 85    Recent Results (from the past 240 hour(s))  Culture, blood (routine x 2) Call MD if unable to obtain prior to antibiotics being given     Status: None (Preliminary result)   Collection Time: 07/05/15 11:05 AM  Result Value Ref Range Status   Specimen Description BLOOD RIGHT HAND  Final   Special Requests BOTTLES DRAWN AEROBIC AND ANAEROBIC 10CC  Final   Culture NO GROWTH 2 DAYS  Final   Report Status PENDING  Incomplete  Culture, blood (routine x 2) Call MD if unable to obtain prior to antibiotics being given     Status: None (Preliminary result)   Collection Time: 07/05/15 11:15 AM  Result Value Ref Range Status   Specimen Description BLOOD LEFT ANTECUBITAL  Final   Special Requests BOTTLES DRAWN AEROBIC AND ANAEROBIC 10CC  Final   Culture NO GROWTH 2 DAYS  Final   Report Status PENDING  Incomplete  MRSA PCR Screening     Status: None   Collection Time: 07/05/15  1:40 PM  Result Value Ref Range Status   MRSA by PCR NEGATIVE NEGATIVE Final    Comment:        The GeneXpert MRSA Assay (FDA approved for NASAL specimens only), is one component of a comprehensive MRSA colonization surveillance program. It is not intended to diagnose MRSA infection nor to guide or monitor treatment for MRSA infections.      Studies:              All Imaging reviewed and is as per above notation   Scheduled Meds: . antiseptic oral rinse  7 mL Mouth Rinse BID  . aspirin  EC  81 mg Oral Daily  . budesonide (PULMICORT) nebulizer solution  0.5 mg Nebulization BID  . carvedilol  3.125 mg Oral BID WC  . enoxaparin (LOVENOX) injection  40 mg Subcutaneous Q24H  .  furosemide  80 mg Intravenous Daily  . imipenem-cilastatin  250 mg Intravenous 4 times per day  . ipratropium  0.5 mg Nebulization BID  . levalbuterol  0.63 mg Nebulization BID  . lisinopril  5 mg Oral Daily  . pantoprazole  40 mg Oral Q1200  . PARoxetine  25 mg Oral Daily  . phosphorus  500 mg Oral BID  . pravastatin  40 mg Oral QPM  . predniSONE  60 mg Oral QAC breakfast   Continuous Infusions:    Assessment/Plan:  Chest pain / ST elevation on EKG / Mild CAD S/p cath 3/30 noting no critical stenosis - Cards following - cont statin - start ASA Unlikely component of microangiopathic ischemia- defer further management including nitrates and hydralazine to cardiology  Acute hypoxic respiratory failure Much improved - now appears to have been primarily due to acute CHF exac  - no clear inidication of acute infection  - d/c abx - wean O2 as able  - gently diurese with Lasix 80 twice a day -rest as per cardiology  Non-ischemic cardiomyopathy - possible stress induced cardiomyopathy - Acute systolic CHF  EF [id number] via TTE 3/31 but no WMA - medical management - Cards following - gently diurese and follow - no exam findings to suggest severe volume overload at this time, but recent weight as low as 55kg - Cards added ACEi - add bisoprolol in setting of COPD  Hyperglycemia, steroid induced Controlled  CBG ranging 85-107- Transitioning to by mouth prednisone 60 mg 07/07/15  Hyperlipidemia Uptitrate to Pravachol 40 mg daily as increased risk of ASCVD as is a smoker  Mild transamininitis likley due to passive congestion due to CHF -  -Currently completely resolved  Depression      Verneita Griffes, MD  [facility] Pager [phone] 07/08/2015, 10:22 AM    LOS: 3 days

## 2015-07-08 NOTE — Progress Notes (Signed)
After several attempts to apply the bed alarm, patient refused. Continue to monitor pt.

## 2015-07-10 LAB — CULTURE, BLOOD (ROUTINE X 2)
CULTURE: NO GROWTH
CULTURE: NO GROWTH

## 2015-07-12 ENCOUNTER — Telehealth: Payer: Self-pay | Admitting: Internal Medicine

## 2015-07-12 NOTE — Telephone Encounter (Signed)
Spoke to pt, told her I read triage note and discussed with Dr.Hunter and he said pt to go to Urgent Care or ED due to spasms in chest. Pt verbalized understanding and said she does not want to got to Urgent care or ED has an appt tomorrow morning with Dr. Maudie Mercury at 10:30. Pt said she has had these before they are muscle spasms and not heart related. Pt said she has had them for a few days and they are not all the time. Pt said the main problem is her hands. Asked pt if having any SOB? Pt said no, pulse ox is running 92-96. Told pt okay if you develop SOB or Chest pain please go to ED. Pt verbalized understanding. Dr.Hunter made aware of pt's symptoms and that  pt did not want to go to ED or Urgent Care has an appt tomorrow.

## 2015-07-12 NOTE — Telephone Encounter (Signed)
[facility] Day - Client [place] Patient Name: AI BREAULT DOB: 08-05-1955 Initial Comment Caller states she last Thursday she had a COPD attack and a stress heart attack. She is having muscle spasms in her chest and bad cramping in her hand - not like a heart attack. Nothing on her new medicine says cramping is a side affect. She is on low sodium and low water intake diet as well. Nurse Assessment Nurse: Lillia Corporal, RN, Lenell Antu Date/Time (Eastern Time): 07/12/2015 3:41:45 PM Confirm and document reason for call. If symptomatic, describe symptoms. You must click the next button to save text entered. ---Caller states she last Thursday she had a COPD attack and a stress heart attack. She is having muscle spasms in her chest and bad cramping in her hand - not like a heart attack. Nothing on her new medicine says cramping is a side affect. She is on low sodium and low water intake diet as well. states symptoms began 2 days ago, but cramping in her hands are becoming very painful. PRS 5/10, intermittent, states can see muscles twitching on chest. PRS 8/10 cramping on hands, worsened by use of hands. Stateshas tried soaking hands in warm water and using gloves and symptoms do not improve. Has the patient traveled out of the country within the last 30 days? ---No Does the patient have any new or worsening symptoms? ---Yes Will a triage be completed? ---Yes Related visit to physician within the last 2 weeks? ---No Does the PT have any chronic conditions? (i.e. diabetes, asthma, etc.) ---Yes List chronic conditions. ---CAD, hypertension, COPD, Is this a behavioral health or substance abuse call? ---No Guidelines Guideline Title Affirmed Question Affirmed Notes Hand and Wrist Pain [1] MODERATE pain (e.g., interferes with normal activities) AND [2] present > 3 days Final Disposition User See Physician within [place], RN,  HildaComments Was discharged Sunday and has a f/u with Cardiology next week. Has not made an appointment with PCP yet. Started a new medicine while in the hospital, Lasix 40 mg once daily Q am, and Carvedilol, Prednisone, lisinopril. Is on fluid restriction of 32 ounces daily, and low sodium. Has taken lasix for 4 days since she left the hospital. Disposition upgraded due to suspicion that patient's symptoms could be related to patient's recent use of Lasix. Patient wasComments not prescribed Potassium supplementation on discharge. Office contacted to attempt to discuss symptoms with MD/ RN, unavailable at time of call. Instructed by office staff to send fax report with information. Information will also be entered on EPIC per Max Sane, RN, since nurse does not have access to EPIC. Referrals REFERRED TO PCP OFFICEDisagree/Comply: Comply Call Id: [id number]

## 2015-07-13 ENCOUNTER — Ambulatory Visit (INDEPENDENT_AMBULATORY_CARE_PROVIDER_SITE_OTHER): Payer: 59 | Admitting: Family Medicine

## 2015-07-13 ENCOUNTER — Encounter: Payer: Self-pay | Admitting: Family Medicine

## 2015-07-13 VITALS — BP 102/60 | HR 82 | Temp 98.2°F | Ht 63.0 in | Wt 121.2 lb

## 2015-07-13 DIAGNOSIS — J42 Unspecified chronic bronchitis: Secondary | ICD-10-CM | POA: Diagnosis not present

## 2015-07-13 DIAGNOSIS — R252 Cramp and spasm: Secondary | ICD-10-CM | POA: Diagnosis not present

## 2015-07-13 DIAGNOSIS — R300 Dysuria: Secondary | ICD-10-CM

## 2015-07-13 DIAGNOSIS — I5041 Acute combined systolic (congestive) and diastolic (congestive) heart failure: Secondary | ICD-10-CM

## 2015-07-13 LAB — POCT URINALYSIS DIPSTICK
Bilirubin, UA: NEGATIVE
Blood, UA: NEGATIVE
Glucose, UA: NEGATIVE
KETONES UA: NEGATIVE
LEUKOCYTES UA: NEGATIVE
Nitrite, UA: NEGATIVE
PROTEIN UA: NEGATIVE
Spec Grav, UA: 1.015
Urobilinogen, UA: 0.2
pH, UA: 6

## 2015-07-13 LAB — BASIC METABOLIC PANEL
BUN: 40 mg/dL — ABNORMAL HIGH (ref 6–23)
CHLORIDE: 96 meq/L (ref 96–112)
CO2: 32 mEq/L (ref 19–32)
Calcium: 9.9 mg/dL (ref 8.4–10.5)
Creatinine, Ser: 1.04 mg/dL (ref 0.40–1.20)
GFR: 57.18 mL/min — ABNORMAL LOW (ref >60.00)
GLUCOSE: 92 mg/dL (ref 70–99)
POTASSIUM: 4.3 meq/L (ref 3.5–5.1)
Sodium: 136 mEq/L (ref 135–145)

## 2015-07-13 NOTE — Progress Notes (Signed)
Pre visit review using our clinic review tool, if applicable. No additional management support is needed unless otherwise documented below in the visit note. 

## 2015-07-13 NOTE — Addendum Note (Signed)
Addended by: Lahoma Crocker A on: 07/13/2015 11:10 AM   Modules accepted: Orders

## 2015-07-13 NOTE — Patient Instructions (Addendum)
Before you leave: -Urine dip with reflex if needed -Schedule hospital follow-up with primary doctor in 2-4 weeks; follow up as needed otherwise -Labs  Chief your follow-up with her cardiologist and please contact her cardiologist immediately if you're having any concerns regarding your heart or your heart medications.

## 2015-07-13 NOTE — Progress Notes (Signed)
HPI:  Kim [name] is a pleasant 60 year old with a past medical history significant for depression, COPD, cervical radiculopathy, hyperlipidemia, tobacco abuse and recent heart failure here for an acute visit for hand discomfort.  She was recently hospitalized for a COPD exacerbation, acute respiratory distress, and acute systolic and diastolic congestive heart failure. She had an echo that showed akinesis of all the apical segments and a severely decreased ejection fraction at 25-30%. Had a catheterization as well that showed minimal coronary disease. She underwent significant diuresis and was discharged on Lasix, a beta blocker and lisinopril. She also is on steroids for her COPD. Since her discharge she has had some cramping in her hands - this has now resolved. There is mention of chest pain and the triage note, she denies this and says she actually had abdominal cramping and that this has resolved as well. She has had some mild burning with urination, but denies persistent abdominal pain, changes in her bowels, hematuria, vaginal discharge, vomiting or nausea, fevers or malaise. She actually feels her energy has improved since discharge. She denies chest pain, palpitations, swelling or difficulty breathing. She reports she is watching her diet very closely and is monitoring her weights daily.  Per the discharge summary she was supposed to follow up promptly with her cardiologist after discharge and recheck labs at that time. She has an appointment next week.   ROS: See pertinent positives and negatives per HPI.  Past Medical History  Diagnosis Date  . C8 RADICULOPATHY 06/05/2009  . COPD 08/31/2007  . DEPRESSION 11/19/2006  . DYSLIPIDEMIA 03/12/2009  . 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 OSTEOARTHRITIS 06/05/2009  . PARESTHESIA 08/24/2007  . TOBACCO ABUSE 01/25/2010    Past Surgical History  Procedure Laterality Date  . Appendectomy  1969  . Cholecystectomy  1989  . Exploratory laparotomy  1969  .  Collapse lung  1990  . Cardiac catheterization N/A 07/05/2015    Procedure: Left Heart Cath and Coronary Angiography;  Surgeon: Leonie Man, MD;  Location: [facility];  Service: Cardiovascular;  Laterality: N/A;    Family History  Problem Relation Age of Onset  . Cancer Mother     lung  . Heart attack Mother   . Hypertension Sister   . Multiple sclerosis Brother   . Multiple sclerosis Other   . Alcohol abuse Other   . Arthritis Other   . Diabetes Other   . Kidney disease Other   . Cancer Other     lung,ovarian,skin, uterine  . Stroke Other   . Heart disease Other   . Melanoma Other   . Diabetes Sister   . Rheum arthritis Sister   . Thyroid disease Sister   . Colon cancer Neg Hx   . Heart attack Maternal Uncle   . Heart attack Other     NEICE  . Heart attack Other     NEPHEW  . Hypertension Brother   . Stroke Maternal Grandmother     Social History   Social History  . Marital Status: Married    Spouse Name: N/A  . Number of Children: 2  . Years of Education: N/A   Occupational History  . Patient care aide-sits with elderly lady    Social History Main Topics  . Smoking status: Former Smoker -- 1.50 packs/day for 30 years    Types: Cigarettes    Quit date: 12/21/2013  . Smokeless tobacco: Never Used  . Alcohol Use: No  . Drug Use: No  .  Sexual Activity: Not Asked   Other Topics Concern  . None   Social History Narrative     Current outpatient prescriptions:  .  albuterol (PROVENTIL HFA;VENTOLIN HFA) 108 (90 BASE) MCG/ACT inhaler, Inhale 2 puffs into the lungs every 4 (four) hours as needed for wheezing or shortness of breath., Disp: , Rfl:  .  ALPRAZolam (XANAX) 0.5 MG tablet, TAKE 1/2 TO 1 TABLET BY MOUTH 3 TIMES A DAY AS NEEDED, Disp: 90 tablet, Rfl: 2 .  aspirin EC 81 MG EC tablet, Take 1 tablet (81 mg total) by mouth daily., Disp: , Rfl:  .  budesonide-formoterol (SYMBICORT) 160-4.5 MCG/ACT inhaler, Inhale 2 puffs into the lungs 2 (two)  times daily., Disp: 1 Inhaler, Rfl: 5 .  carvedilol (COREG) 3.125 MG tablet, Take 1 tablet (3.125 mg total) by mouth 2 (two) times daily with a meal., Disp: 60 tablet, Rfl: 0 .  chlorpheniramine-HYDROcodone (TUSSIONEX) 10-8 MG/5ML SUER, Take 5 mLs by mouth every 12 (twelve) hours., Disp: 140 mL, Rfl: 0 .  Cholecalciferol (VITAMIN D3) 1000 UNITS CAPS, Take 1 capsule by mouth 2 (two) times daily. , Disp: , Rfl: 0 .  Dextromethorphan-Guaifenesin (MUCINEX DM MAXIMUM STRENGTH) 60-1200 MG TB12, Take 1,200 mg by mouth 2 (two) times daily., Disp: , Rfl:  .  furosemide (LASIX) 40 MG tablet, Take 1 tablet (40 mg total) by mouth daily., Disp: 30 tablet, Rfl: 0 .  HYDROcodone-acetaminophen (NORCO) 7.5-325 MG tablet, Take 1 tablet by mouth every 6 (six) hours as needed for moderate pain., Disp: 90 tablet, Rfl: 0 .  lisinopril (PRINIVIL,ZESTRIL) 5 MG tablet, Take 1 tablet (5 mg total) by mouth daily., Disp: 30 tablet, Rfl: 0 .  nystatin (MYCOSTATIN) 100000 UNIT/ML suspension, Take 5 mLs (500,000 Units total) by mouth 4 (four) times daily. (Patient taking differently: Take 5 mLs by mouth daily as needed. ), Disp: 60 mL, Rfl: 0 .  PAXIL CR 25 MG 24 hr tablet, Take 1 tablet by mouth  every morning, Disp: 90 tablet, Rfl: 1 .  pravastatin (PRAVACHOL) 20 MG tablet, Take 1 tablet (20 mg total) by mouth every evening., Disp: 90 tablet, Rfl: 3 .  predniSONE (DELTASONE) 20 MG tablet, Take 2 tablets (40 mg total) by mouth daily before breakfast., Disp: 20 tablet, Rfl: 0 .  SPIRIVA HANDIHALER 18 MCG inhalation capsule, Inhale contents of 1 capsule daily, Disp: , Rfl: 1  EXAM:  Filed Vitals:   07/13/15 1041  BP: 102/60  Pulse: 82  Temp: 98.2 F (36.8 C)    Body mass index is 21.48 kg/(m^2).  GENERAL: vitals reviewed and listed above, alert, oriented, appears well hydrated and in no acute distress, she appears quite comfortable today  HEENT: atraumatic, conjunttiva clear, no obvious abnormalities on inspection of  external nose and ears  NECK: no obvious masses on inspection  LUNGS: clear to auscultation bilaterally, no wheezes, rales or rhonchi, good air movement  CV: HRRR, no peripheral edema  ABD: Bowel sounds positive in all 4 quadrants, abdomen soft and nontender to palpation   MS: moves all extremities without noticeable abnormality, hands appear normal with normal circulation and function today without fasciculations or cramping or increased muscle tone  PSYCH: pleasant and cooperative, no obvious depression or anxiety  ASSESSMENT AND PLAN:  Discussed the following assessment and plan:  Hand cramps - Plan: Basic metabolic panel  Acute combined systolic and diastolic congestive heart failure (HCC)  Chronic bronchitis, unspecified chronic bronchitis type (HCC) -Check urine -BMP to check electrolytes, cramping and  abdominal cramping or very common side effects with Lasix and she recently underwent heavy diuresis, this has resolved today and is likely due to her now decreased dose of Lasix. -Schedule follow-up with PCP in 2-4 weeks for hospital follow-up and to recheck; she will need a recheck of her white counts after she is off of steroids and has recovered from acute illness -Advised that she notify her cardiologist immediately if she is having any chest discomfort, worsening trouble breathing or any heart concerns and that she follow up as scheduled  -Patient advised to return or notify a doctor immediately if symptoms worsen or persist or new concerns arise.  Patient Instructions  Before you leave: -Urine dip with reflex if needed -Schedule hospital follow-up with primary doctor in 2-4 weeks; follow up as needed otherwise -Labs  Chief your follow-up with her cardiologist and please contact her cardiologist immediately if you're having any concerns regarding your heart or your heart medications.        Colin Benton R.

## 2015-07-19 ENCOUNTER — Ambulatory Visit (INDEPENDENT_AMBULATORY_CARE_PROVIDER_SITE_OTHER): Payer: 59 | Admitting: Cardiology

## 2015-07-19 ENCOUNTER — Encounter: Payer: Self-pay | Admitting: Cardiology

## 2015-07-19 VITALS — BP 82/52 | HR 44 | Ht 63.0 in | Wt 120.0 lb

## 2015-07-19 DIAGNOSIS — R001 Bradycardia, unspecified: Secondary | ICD-10-CM | POA: Diagnosis not present

## 2015-07-19 DIAGNOSIS — I5181 Takotsubo syndrome: Secondary | ICD-10-CM

## 2015-07-19 DIAGNOSIS — I2510 Atherosclerotic heart disease of native coronary artery without angina pectoris: Secondary | ICD-10-CM

## 2015-07-19 DIAGNOSIS — R9431 Abnormal electrocardiogram [ECG] [EKG]: Secondary | ICD-10-CM

## 2015-07-19 DIAGNOSIS — I5041 Acute combined systolic (congestive) and diastolic (congestive) heart failure: Secondary | ICD-10-CM | POA: Diagnosis not present

## 2015-07-19 DIAGNOSIS — I959 Hypotension, unspecified: Secondary | ICD-10-CM

## 2015-07-19 DIAGNOSIS — I9589 Other hypotension: Secondary | ICD-10-CM

## 2015-07-19 DIAGNOSIS — J449 Chronic obstructive pulmonary disease, unspecified: Secondary | ICD-10-CM

## 2015-07-19 LAB — BASIC METABOLIC PANEL
BUN: 40 mg/dL — ABNORMAL HIGH (ref 7–25)
CALCIUM: 9.5 mg/dL (ref 8.6–10.4)
CO2: 29 mmol/L (ref 20–31)
Chloride: 97 mmol/L — ABNORMAL LOW (ref 98–110)
Creat: 1.16 mg/dL — ABNORMAL HIGH (ref 0.50–0.99)
Glucose, Bld: 105 mg/dL — ABNORMAL HIGH (ref 65–99)
POTASSIUM: 3.7 mmol/L (ref 3.5–5.3)
SODIUM: 137 mmol/L (ref 135–146)

## 2015-07-19 LAB — MAGNESIUM: MAGNESIUM: 2 mg/dL (ref 1.5–2.5)

## 2015-07-19 MED ORDER — CARVEDILOL 3.125 MG PO TABS: 3.1250 mg | ORAL_TABLET | Freq: Two times a day (BID) | ORAL | Status: DC

## 2015-07-19 MED ORDER — FUROSEMIDE 40 MG PO TABS: 40.0000 mg | ORAL_TABLET | ORAL | Status: DC

## 2015-07-19 MED ORDER — PRAVASTATIN SODIUM 20 MG PO TABS: 20.0000 mg | ORAL_TABLET | Freq: Every evening | ORAL | Status: DC

## 2015-07-19 MED ORDER — LISINOPRIL 2.5 MG PO TABS: 2.5000 mg | ORAL_TABLET | Freq: Every day | ORAL | Status: DC

## 2015-07-19 NOTE — Progress Notes (Signed)
Cardiology Office Note   Date:  07/20/2015   ID:  Kim [name], DOB 1955-07-12, MRN [id number]  PCP:  [name], MD  Cardiologist: Dr. Angelena Form    Chief Complaint  Patient presents with  . Hospitalization Follow-up    feels dizzy and lighheaded      History of Present Illness: Kim [name] is a 60 y.o. female who presents for post hospitalization.    She has a history of COPD and heavy prior smoking history. The patient has coronary artery calcification by CT and luminal irregularities on coronary angiography done in [facility], [place]. At that time, under very similar circumstances, the patient presented with acute respiratory distress also complaining of chest pressure much more severe than today's presentation. Coronary angiography ultimately demonstrated three-vessel coronary luminal irregularities but no angiographically significant disease. 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  She was hospitalized in early March with respiratory distress -COPD exacerbation she was discharged 06/28/15 and then readmitted 07/05/15 after developing dyspnea and chest pressure.  Placed on BiPAP, and breathing and chest pressure improved.      Cardiac cath was done with minimal CAD but severe LV dysfunction, and severely elevated LVEDP in setting of systemic Hypotension.   EF 25%.  By Echo 25-30%.   NICM Takotsubo's CMshe was on Coreg and lisinopril     CAD Mild at cath Recomm ASA 81 mg Continue pravastatin INcrease to 40   COPD exacerbatoin as well.  Wt at discharge 122 lbs.  Today she complains of dizziness and BP is 87 systolic.  She stated at home her BP has been 0000000 to [id number] systolic.  She is weak and tired.  She has been measuring her intake and her output.  She is only taking in a 1 quart of fluids a day.  She is very thirsty.  Her wt continues to decrease.  No SOB only needed to  use albuterol inhaler once.  She has had leg cramps,   She complains of changes in HR from 44 to 107.  She also has had left arm tingling.      Past Medical History  Diagnosis Date  . C8 RADICULOPATHY 06/05/2009  . COPD 08/31/2007  . DEPRESSION 11/19/2006  . DYSLIPIDEMIA 03/12/2009  . 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 OSTEOARTHRITIS 06/05/2009  . PARESTHESIA 08/24/2007  . TOBACCO ABUSE 01/25/2010    Past Surgical History  Procedure Laterality Date  . Appendectomy  1969  . Cholecystectomy  1989  . Exploratory laparotomy  1969  . Collapse lung  1990  . Cardiac catheterization N/A 07/05/2015    Procedure: Left Heart Cath and Coronary Angiography;  Surgeon: Leonie Man, MD;  Location: [facility];  Service: Cardiovascular;  Laterality: N/A;     Current Outpatient Prescriptions  Medication Sig Dispense Refill  . albuterol (PROVENTIL HFA;VENTOLIN HFA) 108 (90 BASE) MCG/ACT inhaler Inhale 2 puffs into the lungs every 4 (four) hours as needed for wheezing or shortness of breath.    . ALPRAZolam (XANAX) 0.5 MG tablet TAKE 1/2 TO 1 TABLET BY MOUTH 3 TIMES A DAY AS NEEDED 90 tablet 2  . aspirin EC 81 MG EC tablet Take 1 tablet (81 mg total) by mouth daily.    . budesonide-formoterol (SYMBICORT) 160-4.5 MCG/ACT inhaler Inhale 2 puffs into the lungs 2 (two) times daily. 1 Inhaler 5  . carvedilol (COREG) 3.125 MG tablet Take 1 tablet (3.125 mg total) by  mouth 2 (two) times daily with a meal. 60 tablet 5  . chlorpheniramine-HYDROcodone (TUSSIONEX) 10-8 MG/5ML SUER Take 5 mLs by mouth every 12 (twelve) hours. 140 mL 0  . Cholecalciferol (VITAMIN D3) 1000 UNITS CAPS Take 1 capsule by mouth 2 (two) times daily.   0  . furosemide (LASIX) 40 MG tablet Take 1 tablet (40 mg total) by mouth every other day. 30 tablet 3  . HYDROcodone-acetaminophen (NORCO) 7.5-325 MG tablet Take 1 tablet by mouth every 6 (six) hours as needed for moderate pain. 90 tablet 0  . lisinopril (PRINIVIL,ZESTRIL) 2.5 MG tablet  Take 1 tablet (2.5 mg total) by mouth daily. 30 tablet 6  . nystatin (MYCOSTATIN) 100000 UNIT/ML suspension Take 5 mLs by mouth 4 (four) times daily. Take 5 mls or 500,000 units 4 times a day    . PAXIL CR 25 MG 24 hr tablet Take 1 tablet by mouth  every morning 90 tablet 1  . pravastatin (PRAVACHOL) 20 MG tablet Take 1 tablet (20 mg total) by mouth every evening. 30 tablet 5  . SPIRIVA HANDIHALER 18 MCG inhalation capsule Inhale contents of 1 capsule daily  1   No current facility-administered medications for this visit.    Allergies:   Penicillins    Social History:  The patient  reports that she quit smoking about 18 months ago. Her smoking use included Cigarettes. She has a 45 pack-year smoking history. She has never used smokeless tobacco. She reports that she does not drink alcohol or use illicit drugs.   Family History:  The patient's family history includes Alcohol abuse in her other; Arthritis in her other; Cancer in her mother and other; Diabetes in her other and sister; Heart attack in her maternal uncle, mother, other, and other; Heart disease in her other; Hypertension in her brother and sister; Kidney disease in her other; Melanoma in her other; Multiple sclerosis in her brother and other; Rheum arthritis in her sister; Stroke in her maternal grandmother and other; Thyroid disease in her sister. There is no history of Colon cancer.    ROS:  General:no colds or fevers, weight decreased Skin:no rashes or ulcers HEENT:no blurred vision, no congestion CV:see HPI PUL:see HPI GI:no diarrhea constipation or melena, no indigestion GU:no hematuria, no dysuria MS:no joint pain, no claudication Neuro:no syncope, + lightheadedness Endo:no diabetes, no thyroid disease  Wt Readings from Last 3 Encounters:  07/19/15 120 lb (54.432 kg)  07/13/15 121 lb 3.2 oz (54.976 kg)  07/08/15 122 lb 3.2 oz (55.43 kg)     PHYSICAL EXAM: VS:  BP 82/52 mmHg  Pulse 44  Ht 5\' 3"  (1.6 m)  Wt 120 lb  (54.432 kg)  BMI 21.26 kg/m2  SpO2 99% , BMI Body mass index is 21.26 kg/(m^2). General:Pleasant affect, NAD Skin:Warm and dry, brisk capillary refill HEENT:normocephalic, sclera clear, mucus membranes moist Neck:supple, no JVD, no bruits  Heart:S1S2 RRR without murmur, gallup, rub or click Lungs:clear without rales, rhonchi, or wheezes [id number], non tender, + BS, do not palpate liver spleen or masses Ext:no lower ext edema, 2+ pedal pulses, 2+ radial pulses Neuro:alert and oriented, MAE, follows commands, + facial symmetry    EKG:  EKG is ordered today. The ekg ordered today demonstrates SR with new deep T wave inversion II,III, AVF, V3-V6 and I - reviewed with Dr. Lovena Le DOD- pt without pain.  Monitor on next visit.    Recent Labs: 06/24/2015: TSH 0.489 07/07/2015: B Natriuretic Peptide 330.4* 07/08/2015: ALT 29; Hemoglobin 15.5*; Platelets  314 07/19/2015: BUN 40*; Creat 1.16*; Magnesium 2.0; Potassium 3.7; Sodium 137    Lipid Panel    Component Value Date/Time   CHOL 216* 07/07/2015 0420   TRIG 211* 07/07/2015 0420   HDL 79 07/07/2015 0420   CHOLHDL 2.7 07/07/2015 0420   VLDL 42* 07/07/2015 0420   LDLCALC 95 07/07/2015 0420   LDLDIRECT 155.4 12/29/2012 1209       Other studies Reviewed: Additional studies/ records that were reviewed today include: hospital notes, cath . 1. Angiographically minimal coronary disease 2. There is severe left ventricular systolic dysfunction. 3. 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  Recommendations:  Return the CCU/TCU for post radial cath care and ongoing treatment of nonischemic cardiomyopathy  Continue aggressive risk modification and diuresis.  Treat underlying cause for stress induced cardiomyopathy    ECHO: Study Conclusions  - Left ventricle: The cavity size was normal. Systolic function was  severely reduced. The estimated ejection  fraction was in the  range of 25% to 30%. Wall motion was normal; there were no  regional wall motion abnormalities. Doppler parameters are  consistent with abnormal left ventricular relaxation (grade 1  diastolic dysfunction). There was no evidence of elevated  ventricular filling pressure by Doppler parameters. - Aortic valve: There was moderate regurgitation. - Aortic root: The aortic root was normal in size. - Left atrium: The atrium was normal in size. - Right ventricle: The cavity size was normal. Wall thickness was  normal. Systolic function was normal. - Right atrium: The atrium was normal in size. - Tricuspid valve: There was trivial regurgitation. - Pulmonary arteries: Systolic pressure was within the normal  range. - Pericardium, extracardiac: A trivial pericardial effusion was  identified posterior to the heart. Features were not consistent  with tamponade physiology.  Impressions:  - There is akinesis of all of the apical segments, hypokinesis of  the mid segments and normal motion of the basal segments  consistent with stress induced cardiomyopathy (Tako-tsubo).  LVEF is severely decreased estimated at 25-30%.  ASSESSMENT AND PLAN:  1.  Hypotension with lightheadedness discussed with Dr. Lovena Le DOD  along with  EKG abnormalities..  Will monitor EKG and decrease lisinopril to 2.5 daily - change lasix to every other day.  Increase fluids, stop measuring I&O go by weight and any swelling.  Continue to avoid salt.  She will call if no improvement.  Will check BMP and magnesium.  She will follow up in 2 weeks with APP and 6 weeks with Dr. Angelena Form.  Lt arm tingling may be the hypotension.  2. Bradycardia with weakness at times check labs and 2 week event monitor.    3. Acute combined systolic and diastolic HF  euvolemic to dry. Continue to weigh daily. See above.  4. Minimal CAD at cath, continue statin and ASA.  5. NICM Takotsubo's CM LVEF 15 to 20% on  ACE, BB and diuretic, have decreased ACE due to hypotension  6. COPD now off steroid.  Stable, to see Pulmonary on Tuesday.  7 abnormal EKG - reviewed with MD- will follow     Current medicines are reviewed with the patient today.  The patient Has no concerns regarding medicines.  The following changes have been made:  See above Labs/ tests ordered today include:see above  Disposition:   FU:  see above  Signed, Isaiah Serge, NP  07/20/2015 10:38 PM    [address], [place], [place] Phone: [phone]; Fax: [phone]

## 2015-07-19 NOTE — Patient Instructions (Addendum)
Medication Instructions:   START TAKING LASIX EVERY OTHER DAY  START  TAKING LISINOPRIL  2.5 MG ONCE A  DAY     If you need a refill on your cardiac medications before your next appointment, please call your pharmacy.  Labwork:  BMET AND MAGNESIUM    Testing/Procedures:  Your physician has recommended that you wear an event monitor. FOR 2 WEEKS  Event monitors are medical devices that record the heart's electrical activity. Doctors most often [place] these monitors to diagnose arrhythmias. Arrhythmias are problems with the speed or rhythm of the heartbeat. The monitor is a small, portable device. You can wear one while you do your normal daily activities. This is usually used to diagnose what is causing palpitations/syncope (passing out).     Follow-Up: IN 2 TO 3 WEEKS WITH EXTENDER                     IN 6 WEEKS WITH DR [facility] [place]    Any Other Special Instructions Will Be Listed Below (If Applicable).

## 2015-07-20 ENCOUNTER — Encounter: Payer: Self-pay | Admitting: Cardiology

## 2015-07-20 ENCOUNTER — Telehealth: Payer: Self-pay | Admitting: Cardiovascular Disease

## 2015-07-20 NOTE — Telephone Encounter (Signed)
New message    Pt states she is returning call to [facility] for Labs

## 2015-07-23 NOTE — Telephone Encounter (Signed)
Discussed with Dr. Angelena Form will stop lisinopril and lasix.  Call if wt increases.  Or if any increase SOB.

## 2015-07-23 NOTE — Telephone Encounter (Signed)
Spoke with pt. She reports blood pressures below.  Reports blood pressure has been low since hospitalization. Meds changed at office visit with Cecilie Kicks, NP last week.  Pt reports she has not taken AM meds yet today. Usually takes around 11 AM.  Today is a Lasix day. She reports dizziness worse on days she takes Lasix.  Reports when BP drops to 60's she is more dizzy than when in 70's.  Heart rate ranges 38-90.  Is scheduled for 2 week event monitor on 07/25/15.  She is staying hydrated and watching salt intake. Also complains of left lower back pain. This was present when in hospital and is continuing.  Pain feels like a sore muscle pain. Not continuous but present most of the time. She has not taken anything for pain due to low blood pressure.  I told pt not to take AM BP meds until I call her back. Will review with provider in office.

## 2015-07-23 NOTE — Telephone Encounter (Signed)
New Message  Bp is low   Pt c/o BP issue:  1. What are your last 5 BP readings?   70/48 BP @ 9:40a on 07/23/15 75/46 BP @ 9:30p on 07/22/15 90/51 BP @ 6:00p on 07/22/15 62/40 BP @ 1:00p on 07/22/15   2. Are you having any other symptoms (ex. Dizziness, headache, blurred vision, passed out)? dizziness

## 2015-07-23 NOTE — Telephone Encounter (Signed)
Spoke with pt and gave her instructions from Cecilie Kicks, NP.  Appts made for follow up with Cecilie Kicks, NP and Dr. Angelena Form.  I told pt it was OK to take tylenol for back pain.

## 2015-07-24 ENCOUNTER — Encounter: Payer: Self-pay | Admitting: Acute Care

## 2015-07-24 ENCOUNTER — Ambulatory Visit (INDEPENDENT_AMBULATORY_CARE_PROVIDER_SITE_OTHER): Payer: 59 | Admitting: Acute Care

## 2015-07-24 ENCOUNTER — Inpatient Hospital Stay: Payer: 59 | Admitting: Adult Health

## 2015-07-24 VITALS — BP 88/50 | HR 83 | Temp 97.7°F | Ht 63.0 in | Wt 124.4 lb

## 2015-07-24 DIAGNOSIS — J42 Unspecified chronic bronchitis: Secondary | ICD-10-CM

## 2015-07-24 DIAGNOSIS — R911 Solitary pulmonary nodule: Secondary | ICD-10-CM

## 2015-07-24 NOTE — Assessment & Plan Note (Signed)
Recent COPD exacerbation requiring hospitalization: Plan: I am glad you are feeling better. Continue your Spiriva Continue your Symbicort Use your Albuterol rescue inhaler as needed. You can add Claritin for allergies. You can use the generic which is loratadine. You can also add the nasal saline as needed. Schedule with Dr. Lamonte Sakai for first available  You are due for your CT Chest Scan in August 2017. Please contact office for sooner follow up if symptoms do not improve or worsen or seek emergency care

## 2015-07-24 NOTE — Assessment & Plan Note (Signed)
4 mm solid pulmonary nodule. Plan: You are due for your CT Chest Scan in August 2017.

## 2015-07-24 NOTE — Patient Instructions (Addendum)
I am glad you are feeling better. Continue your Spiriva Continue your Symbicort Use your Albuterol rescue inhaler as needed. You can add Claritin for allergies. You can use the generic which is loratadine. You can also add the nasal saline as needed. Schedule with Dr. Lamonte Sakai for first available  You are due for your CT Chest Scan in August 2017. Please contact office for sooner follow up if symptoms do not improve or worsen or seek emergency care

## 2015-07-24 NOTE — Progress Notes (Signed)
Subjective:    Patient ID: Kim [name], female    DOB: [birth year], 60 y.o.   MRN: [id number]  HPI   60 year old female with a 45-pack-year smoking history who quit smoking 2016, with severe COPD, dyslipidemia, depression, and a 4 mm solid pulmonary nodule. Spirometry >> FEv1 59%, small airways 27 % [03/2010] Recent diagnosis of minimal coronary artery disease but severe LV dysfunction and severely dilated LVEDP in setting of systemic hypertension. EF 25%-30% by echo.NICM Takotsubo's CM.  Significant events/procedures/imaging:  Recent admission 3 06/24/2015 through 06/28/2015 : COPD exacerbation and influenza A  Admit date: 07/05/2015 Discharge date: 07/08/2015  Hospital Course:   Chest pain / ST elevation on EKG / Mild CAD S/p cath 3/30 noting no critical stenosis - Cards following - cont statin - start ASA Unlikely component of microangiopathic ischemia- defer further management As an OP to cardiology  Acute hypoxic respiratory failure Much improved - now appears to have been primarily due to acute CHF exac  - no clear inidication of acute infection - d/c abx - wean O2 as able  - gently diurese with Lasix 80 twice a day in hospital -home dose 40 daily per Cardiology opn day of d/c home -rest as per cardiology  Non-ischemic cardiomyopathy - possible stress induced cardiomyopathy  - Acute systolic CHF  EF [id number] via TTE 3/31 but no WMA - medical management - Cards following - - Cards added ACEi  - added coreg in setting of COPD  Hyperglycemia, steroid induced Controlled  CBG ranging 85-107- Transitioning to by mouth prednisone 60 mg 07/07/15-->transitioned to prednisone 40 for 10 days then stop  Hyperlipidemia Uptitrate to Pravachol 40 mg daily as increased risk of ASCVD as is a smoker  Mild transamininitis likley due to passive congestion due to CHF   Recent Labs: 06/24/2015: TSH 0.489 07/07/2015: B Natriuretic Peptide 330.4* 07/08/2015: ALT 29; Hemoglobin  15.5*; Platelets 314 07/19/2015: BUN 40*; Creat 1.16*; Magnesium 2.0; Potassium 3.7; Sodium 137    Hospital follow-up visit: 07/24/2015:  Patient presents to the office for hospital follow-up with pulmonary following acute hypoxic respiratory failure secondary to acute congestive heart failure exacerbation ;no clear indication of infection. She states she is no longer requiring her oxygen. She has been having ongoing problems with hypotension for which she is being followed by cardiology. Cardiology has discontinued her lisinopril and Lasix on 07/23/2015. She is continuing to take her Coreg. 07/25/2015 she will be placed on a Holter monitor for rhythm diagnostics. She completed her prednisone taper Thursday, 07/19/2015. She is continuing to take her Spiriva and Symbicort as maintenance therapy. She is using her albuterol as needed for shortness of breath and wheezing. She denies fever, chest pain, shortness of breath, orthopnea, hemoptysis, productive or nonproductive cough, calf or leg pain.   Current outpatient prescriptions:  .  albuterol (PROVENTIL HFA;VENTOLIN HFA) 108 (90 BASE) MCG/ACT inhaler, Inhale 2 puffs into the lungs every 4 (four) hours as needed for wheezing or shortness of breath., Disp: , Rfl:  .  ALPRAZolam (XANAX) 0.5 MG tablet, TAKE 1/2 TO 1 TABLET BY MOUTH 3 TIMES A DAY AS NEEDED, Disp: 90 tablet, Rfl: 2 .  aspirin EC 81 MG EC tablet, Take 1 tablet (81 mg total) by mouth daily., Disp: , Rfl:  .  budesonide-formoterol (SYMBICORT) 160-4.5 MCG/ACT inhaler, Inhale 2 puffs into the lungs 2 (two) times daily., Disp: 1 Inhaler, Rfl: 5 .  carvedilol (COREG) 3.125 MG tablet, Take 1 tablet (3.125 mg total) by mouth 2 (two)  times daily with a meal., Disp: 60 tablet, Rfl: 5 .  Cholecalciferol (VITAMIN D3) 1000 UNITS CAPS, Take 1 capsule by mouth 2 (two) times daily. , Disp: , Rfl: 0 .  HYDROcodone-acetaminophen (NORCO) 7.5-325 MG tablet, Take 1 tablet by mouth every 6 (six) hours as needed  for moderate pain., Disp: 90 tablet, Rfl: 0 .  nystatin (MYCOSTATIN) 100000 UNIT/ML suspension, Take 5 mLs by mouth 4 (four) times daily. Take 5 mls or 500,000 units 4 times a day, Disp: , Rfl:  .  PAXIL CR 25 MG 24 hr tablet, Take 1 tablet by mouth  every morning, Disp: 90 tablet, Rfl: 1 .  pravastatin (PRAVACHOL) 20 MG tablet, Take 1 tablet (20 mg total) by mouth every evening., Disp: 30 tablet, Rfl: 5 .  SPIRIVA HANDIHALER 18 MCG inhalation capsule, Inhale contents of 1 capsule daily, Disp: , Rfl: 1   Past Medical History  Diagnosis Date  . C8 RADICULOPATHY 06/05/2009  . COPD 08/31/2007  . DEPRESSION 11/19/2006  . DYSLIPIDEMIA 03/12/2009  . 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 OSTEOARTHRITIS 06/05/2009  . PARESTHESIA 08/24/2007  . TOBACCO ABUSE 01/25/2010    Allergies  Allergen Reactions  . Penicillins Hives    Has patient had a PCN reaction causing immediate rash, facial/tongue/throat swelling, SOB or lightheadedness with hypotension: Yes Has patient had a PCN reaction causing severe rash involving mucus membranes or skin necrosis: No Has patient had a PCN reaction that required hospitalization No Has patient had a PCN reaction occurring within the last 10 years: No If all of the above answers are "NO", then may proceed with Cephalosporin use.       Review of Systems    Constitutional:    +  weight loss, Denies night sweats,  Fevers, chills,+ fatigue, or  lassitude.  HEENT:   No headaches,  Difficulty swallowing,  Tooth/dental problems, or  Sore throat,                No sneezing, itching, ear ache, nasal congestion, post nasal drip,   CV:  No chest pain,  Orthopnea, PND, swelling in lower extremities, anasarca, dizziness, +palpitations, denies syncope.   GI  No heartburn, indigestion, abdominal pain, nausea, vomiting, diarrhea, change in bowel habits, loss of appetite, bloody stools.   Resp:  + shortness of breath with exertion not at rest.  No excess mucus, no productive cough,  No  non-productive cough,  No coughing up of blood.  No change in color of mucus.  No wheezing.  No chest wall deformity  Skin: no rash or lesions.  GU: no dysuria, change in color of urine, no urgency or frequency.  No flank pain, no hematuria   MS:  No joint pain or swelling.  No decreased range of motion.  No back pain.  Psych:  No change in mood or affect. No depression or anxiety.  No memory loss.     Objective:   Physical Exam  BP 88/50 mmHg  Pulse 83  Temp(Src) 97.7 F (36.5 C) (Oral)  Ht 5\' 3"  (1.6 m)  Wt 124 lb 6.4 oz (56.427 kg)  BMI 22.04 kg/m2  SpO2 96%  Physical Exam:  General- No distress,  A&Ox3 ENT: No sinus tenderness, TM clear, pale nasal mucosa, no oral exudate,no post nasal drip, no LAN Cardiac: S1, S2, regular rate and rhythm, no murmur, no JVD Chest: No wheeze/ rales/ dullness; no accessory muscle use, no nasal flaring, no sternal retractions Abd.: Soft Non-tender Ext: No clubbing cyanosis, edema Neuro:  normal strength Skin: No rashes, warm and dry Psych: normal mood and behavior Magdalen Spatz, AGACNP-BC [facility]  07/24/2015    Assessment & Plan:

## 2015-07-25 ENCOUNTER — Ambulatory Visit (INDEPENDENT_AMBULATORY_CARE_PROVIDER_SITE_OTHER): Payer: 59

## 2015-07-25 DIAGNOSIS — I5041 Acute combined systolic (congestive) and diastolic (congestive) heart failure: Secondary | ICD-10-CM | POA: Diagnosis not present

## 2015-07-25 DIAGNOSIS — R001 Bradycardia, unspecified: Secondary | ICD-10-CM

## 2015-08-01 ENCOUNTER — Encounter: Payer: Self-pay | Admitting: Internal Medicine

## 2015-08-01 ENCOUNTER — Ambulatory Visit (INDEPENDENT_AMBULATORY_CARE_PROVIDER_SITE_OTHER): Payer: 59 | Admitting: Internal Medicine

## 2015-08-01 VITALS — BP 150/70 | HR 85 | Temp 98.3°F | Resp 20 | Ht 63.0 in | Wt 126.0 lb

## 2015-08-01 DIAGNOSIS — J449 Chronic obstructive pulmonary disease, unspecified: Secondary | ICD-10-CM | POA: Diagnosis not present

## 2015-08-01 MED ORDER — LISINOPRIL 2.5 MG PO TABS: 2.5000 mg | ORAL_TABLET | Freq: Every day | ORAL | Status: DC

## 2015-08-01 MED ORDER — HYDROCODONE-ACETAMINOPHEN 7.5-325 MG PO TABS: 1.0000 | ORAL_TABLET | Freq: Four times a day (QID) | ORAL | Status: DC | PRN

## 2015-08-01 NOTE — Patient Instructions (Signed)
Limit your sodium (Salt) intake  Please check your blood pressure on a regular basis.  Discontinue lisinopril if systolic blood pressure is less than 100 or you develop symptoms such as lightheadedness, dizziness, weakness   cardiology follow-up as scheduled

## 2015-08-01 NOTE — Progress Notes (Signed)
Pre visit review using our clinic review tool, if applicable. No additional management support is needed unless otherwise documented below in the visit note. 

## 2015-08-01 NOTE — Progress Notes (Signed)
Subjective:    Patient ID: Kim [name], female    DOB: 06/23/1955, 60 y.o.   MRN: [id number]  HPI    60 year old patient who is seen today in follow-up.  She has had a recent hospital discharge for conservation of COPD with acute respiratory failure  In the setting of systolic heart failure.   Today she is doing quite well  Wt Readings from Last 3 Encounters:  08/01/15 126 lb (57.153 kg)  07/24/15 124 lb 6.4 oz (56.427 kg)  07/19/15 120 lb (54.432 kg)   .  She was discharged from the hospital on furosemide and lisinopril and a 1 L fluid restricted diet.  She continues to adhere to a low-sodium plan.  Furosemide was discontinued due to symptomatic hypotension and 2 days later lisinopril discontinued due to persistent hypotension , cardiac catheterization revealed three-vessel luminal irregularities but no obstructive CAD  Past Medical History  Diagnosis Date  . C8 RADICULOPATHY 06/05/2009  . COPD 08/31/2007  . DEPRESSION 11/19/2006  . DYSLIPIDEMIA 03/12/2009  . 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 OSTEOARTHRITIS 06/05/2009  . PARESTHESIA 08/24/2007  . TOBACCO ABUSE 01/25/2010     Social History   Social History  . Marital Status: Married    Spouse Name: N/A  . Number of Children: 2  . Years of Education: N/A   Occupational History  . Patient care aide-sits with elderly lady    Social History Main Topics  . Smoking status: Former Smoker -- 1.50 packs/day for 30 years    Types: Cigarettes    Quit date: 12/21/2013  . Smokeless tobacco: Never Used  . Alcohol Use: No  . Drug Use: No  . Sexual Activity: Not on file   Other Topics Concern  . Not on file   Social History Narrative    Past Surgical History  Procedure Laterality Date  . Appendectomy  1969  . Cholecystectomy  1989  . Exploratory laparotomy  1969  . Collapse lung  1990  . Cardiac catheterization N/A 07/05/2015    Procedure: Left Heart Cath and Coronary Angiography;  Surgeon: Leonie Man, MD;  Location: [facility];  Service: Cardiovascular;  Laterality: N/A;    Family History  Problem Relation Age of Onset  . Cancer Mother     lung  . Heart attack Mother   . Hypertension Sister   . Multiple sclerosis Brother   . Multiple sclerosis Other   . Alcohol abuse Other   . Arthritis Other   . Diabetes Other   . Kidney disease Other   . Cancer Other     lung,ovarian,skin, uterine  . Stroke Other   . Heart disease Other   . Melanoma Other   . Diabetes Sister   . Rheum arthritis Sister   . Thyroid disease Sister   . Colon cancer Neg Hx   . Heart attack Maternal Uncle   . Heart attack Other     NEICE  . Heart attack Other     NEPHEW  . Hypertension Brother   . Stroke Maternal Grandmother     Allergies  Allergen Reactions  . Penicillins Hives    Has patient had a PCN reaction causing immediate rash, facial/tongue/throat swelling, SOB or lightheadedness with hypotension: Yes Has patient had a PCN reaction causing severe rash involving mucus membranes or skin necrosis: No Has patient had a PCN reaction that required hospitalization No Has patient had a PCN reaction occurring within the last 10 years: No If all of  the above answers are "NO", then may proceed with Cephalosporin use.     Current Outpatient Prescriptions on File Prior to Visit  Medication Sig Dispense Refill  . albuterol (PROVENTIL HFA;VENTOLIN HFA) 108 (90 BASE) MCG/ACT inhaler Inhale 2 puffs into the lungs every 4 (four) hours as needed for wheezing or shortness of breath.    . ALPRAZolam (XANAX) 0.5 MG tablet TAKE 1/2 TO 1 TABLET BY MOUTH 3 TIMES A DAY AS NEEDED 90 tablet 2  . aspirin EC 81 MG EC tablet Take 1 tablet (81 mg total) by mouth daily.    . budesonide-formoterol (SYMBICORT) 160-4.5 MCG/ACT inhaler Inhale 2 puffs into the lungs 2 (two) times daily. 1 Inhaler 5  . carvedilol (COREG) 3.125 MG tablet Take 1 tablet (3.125 mg total) by mouth 2 (two) times daily with a meal. 60 tablet 5  . Cholecalciferol  (VITAMIN D3) 1000 UNITS CAPS Take 1 capsule by mouth 2 (two) times daily.   0  . nystatin (MYCOSTATIN) 100000 UNIT/ML suspension Take 5 mLs by mouth 4 (four) times daily. Take 5 mls or 500,000 units 4 times a day    . PAXIL CR 25 MG 24 hr tablet Take 1 tablet by mouth  every morning 90 tablet 1  . pravastatin (PRAVACHOL) 20 MG tablet Take 1 tablet (20 mg total) by mouth every evening. 30 tablet 5  . SPIRIVA HANDIHALER 18 MCG inhalation capsule Inhale contents of 1 capsule daily  1   No current facility-administered medications on file prior to visit.    BP 150/70 mmHg  Pulse 85  Temp(Src) 98.3 F (36.8 C) (Oral)  Resp 20  Ht 5\' 3"  (1.6 m)  Wt 126 lb (57.153 kg)  BMI 22.33 kg/m2  SpO2 96%     Review of Systems  Constitutional: Negative.   HENT: Negative for congestion, dental problem, hearing loss, rhinorrhea, sinus pressure, sore throat and tinnitus.   Eyes: Negative for pain, discharge and visual disturbance.  Respiratory: Negative for cough and shortness of breath.   Cardiovascular: Negative for chest pain, palpitations and leg swelling.  Gastrointestinal: Negative for nausea, vomiting, abdominal pain, diarrhea, constipation, blood in stool and abdominal distention.  Genitourinary: Negative for dysuria, urgency, frequency, hematuria, flank pain, vaginal bleeding, vaginal discharge, difficulty urinating, vaginal pain and pelvic pain.  Musculoskeletal: Negative for joint swelling, arthralgias and gait problem.  Skin: Negative for rash.  Neurological: Negative for dizziness, syncope, speech difficulty, weakness, numbness and headaches.  Hematological: Negative for adenopathy.  Psychiatric/Behavioral: Negative for behavioral problems, dysphoric mood and agitation. The patient is not nervous/anxious.        Objective:   Physical Exam  Constitutional: She is oriented to person, place, and time. She appears well-developed and well-nourished.  Blood pressure on arrival 150 over  70 Repeat blood pressure 140/70  HENT:  Head: Normocephalic.  Right Ear: External ear normal.  Left Ear: External ear normal.  Mouth/Throat: Oropharynx is clear and moist.  Eyes: Conjunctivae and EOM are normal. Pupils are equal, round, and reactive to light.  Neck: Normal range of motion. Neck supple. No thyromegaly present.  Cardiovascular: Normal rate, regular rhythm, normal heart sounds and intact distal pulses.   Pulmonary/Chest: Effort normal. She has rales.  A few bibasilar rales  Abdominal: Soft. Bowel sounds are normal. She exhibits no mass. There is no tenderness.  Musculoskeletal: Normal range of motion.  No edema  Lymphadenopathy:    She has no cervical adenopathy.  Neurological: She is alert and oriented to person,  place, and time.  Skin: Skin is warm and dry. No rash noted.  Psychiatric: She has a normal mood and affect. Her behavior is normal.          Assessment & Plan:    severe COPD.  Stable no change in regimen.  Follow-up pulmonary medicine .  Solitary pulmonary nodule.  Follow-up chest CT is scheduled for August .  Severe LV dysfunction.    Will rechallenge with lisinopril 2.5 milligrams daily.  Patient will discontinue if systolic blood pressures less than 100 or she develops any symptoms of hypoperfusion.  Patient does have cardiology follow-up on May 8.  Hopeful will tolerate.  Since volume status has improved

## 2015-08-02 ENCOUNTER — Telehealth: Payer: Self-pay | Admitting: Cardiovascular Disease

## 2015-08-02 ENCOUNTER — Emergency Department (HOSPITAL_COMMUNITY)
Admission: EM | Admit: 2015-08-02 | Discharge: 2015-08-03 | Disposition: A | Discharge status: Home / Self Care | Payer: 59 | Attending: Emergency Medicine | Admitting: Emergency Medicine

## 2015-08-02 ENCOUNTER — Encounter (HOSPITAL_COMMUNITY): Payer: Self-pay | Admitting: *Deleted

## 2015-08-02 ENCOUNTER — Emergency Department (HOSPITAL_COMMUNITY): Payer: 59

## 2015-08-02 DIAGNOSIS — J449 Chronic obstructive pulmonary disease, unspecified: Secondary | ICD-10-CM | POA: Diagnosis not present

## 2015-08-02 DIAGNOSIS — I252 Old myocardial infarction: Secondary | ICD-10-CM | POA: Diagnosis not present

## 2015-08-02 DIAGNOSIS — Z7951 Long term (current) use of inhaled steroids: Secondary | ICD-10-CM | POA: Diagnosis not present

## 2015-08-02 DIAGNOSIS — Z87442 Personal history of urinary calculi: Secondary | ICD-10-CM | POA: Diagnosis not present

## 2015-08-02 DIAGNOSIS — Z87891 Personal history of nicotine dependence: Secondary | ICD-10-CM | POA: Diagnosis not present

## 2015-08-02 DIAGNOSIS — M1990 Unspecified osteoarthritis, unspecified site: Secondary | ICD-10-CM | POA: Insufficient documentation

## 2015-08-02 DIAGNOSIS — Z7982 Long term (current) use of aspirin: Secondary | ICD-10-CM | POA: Diagnosis not present

## 2015-08-02 DIAGNOSIS — E785 Hyperlipidemia, unspecified: Secondary | ICD-10-CM | POA: Insufficient documentation

## 2015-08-02 DIAGNOSIS — Z79899 Other long term (current) drug therapy: Secondary | ICD-10-CM | POA: Insufficient documentation

## 2015-08-02 DIAGNOSIS — Z880 Allergy status to penicillin: Secondary | ICD-10-CM | POA: Diagnosis not present

## 2015-08-02 DIAGNOSIS — K219 Gastro-esophageal reflux disease without esophagitis: Secondary | ICD-10-CM | POA: Diagnosis not present

## 2015-08-02 DIAGNOSIS — F329 Major depressive disorder, single episode, unspecified: Secondary | ICD-10-CM | POA: Insufficient documentation

## 2015-08-02 DIAGNOSIS — R079 Chest pain, unspecified: Secondary | ICD-10-CM | POA: Diagnosis present

## 2015-08-02 HISTORY — DX: Nonrheumatic mitral (valve) insufficiency: I34.0

## 2015-08-02 LAB — CBC
HCT: 38.8 % (ref 36.0–46.0)
Hemoglobin: 12.6 g/dL (ref 12.0–15.0)
MCH: 30.6 pg (ref 26.0–34.0)
MCHC: 32.5 g/dL (ref 30.0–36.0)
MCV: 94.2 fL (ref 78.0–100.0)
PLATELETS: 374 10*3/uL (ref 150–400)
RBC: 4.12 MIL/uL (ref 3.87–5.11)
RDW: 14.5 % (ref 11.5–15.5)
WBC: 9 10*3/uL (ref 4.0–10.5)

## 2015-08-02 LAB — BASIC METABOLIC PANEL
Anion gap: 10 (ref 5–15)
BUN: 20 mg/dL (ref 6–20)
CHLORIDE: 104 mmol/L (ref 101–111)
CO2: 25 mmol/L (ref 22–32)
CREATININE: 0.89 mg/dL (ref 0.44–1.00)
Calcium: 9.4 mg/dL (ref 8.9–10.3)
GFR calc Af Amer: >60 mL/min (ref >60)
GFR calc non Af Amer: >60 mL/min (ref >60)
GLUCOSE: 109 mg/dL — AB (ref 65–99)
Potassium: 3.9 mmol/L (ref 3.5–5.1)
SODIUM: 139 mmol/L (ref 135–145)

## 2015-08-02 LAB — I-STAT TROPONIN, ED
TROPONIN I, POC: 0.02 ng/mL (ref 0.00–0.08)
Troponin i, poc: 0.01 ng/mL (ref 0.00–0.08)

## 2015-08-02 MED ORDER — GI COCKTAIL ~~LOC~~
30.0000 mL | Freq: Once | ORAL | Status: AC
  Administered 2015-08-02: 30 mL via ORAL
  Filled 2015-08-02: qty 30

## 2015-08-02 MED ORDER — SIMETHICONE 80 MG PO CHEW
80.0000 mg | CHEWABLE_TABLET | Freq: Once | ORAL | Status: AC
  Administered 2015-08-02: 80 mg via ORAL
  Filled 2015-08-02: qty 1

## 2015-08-02 MED ORDER — FAMOTIDINE 20 MG PO TABS
20.0000 mg | ORAL_TABLET | Freq: Once | ORAL | Status: AC
  Administered 2015-08-02: 20 mg via ORAL
  Filled 2015-08-02: qty 1

## 2015-08-02 MED ORDER — FAMOTIDINE IN NACL 20-0.9 MG/50ML-% IV SOLN: 20.0000 mg | Freq: Once | INTRAVENOUS | Status: DC

## 2015-08-02 MED ORDER — DICYCLOMINE HCL 10 MG PO CAPS
10.0000 mg | ORAL_CAPSULE | Freq: Once | ORAL | Status: AC
  Administered 2015-08-02: 10 mg via ORAL
  Filled 2015-08-02: qty 1

## 2015-08-02 NOTE — Progress Notes (Signed)
Cardiology Office Note    Date:  08/03/2015   ID:  Kim [name], DOB Oct 13, 1955, MRN [id number]  PCP:  [name], MD  Cardiologist:  Dr. Julianne Handler   Labile HTN and chest pain  History of Present Illness:  Kim [name] is a 60 y.o. 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  She has a history of COPD and heavy prior smoking history. The patient has coronary artery calcification by CT and luminal irregularities on coronary angiography done in [facility], [place]. At that time, the patient presented with acute respiratory distress complaining of chest pressure. Coronary angiography ultimately demonstrated three-vessel coronary luminal irregularities but no angiographically significant disease. Anterior and lateral wall motion abnormality was noted and surmised to represent a variant of stress cardiomyopathy.  She was hospitalized in early 06/2015 for respiratory distress 2/2 a COPD exacerbation. She was discharged 06/28/15 and then readmitted 07/05/15 after developing dyspnea and chest pressure. Placed on BiPAP, and breathing and chest pressure improved.Cardiac cath was done with minimal CAD but severe LV dysfunction, and severely elevated LVEDP in setting of systemic hypotension. EF 25%. By Echo 25-30%.She was felt to have NICM 2/2 Takotsubo's CM. She was discharged on Coreg and lisinopril as well as lasix.    She was seen by Kim Kicks NP on 07/19/15 for post hospital follow up. She complained of feeling dizzy and SBP was in 80s. Her lisinorpil was decreased down to 2.5mg  daily and Lasix to EOD. She was told to stop monitoring her fluid intake and go by daily weights and swelling. She was also noted to have some bradycardia and a 2 week monitor was placed. Additionally, her ECG showed SR with new deep T wave inversion  II,III, AVF, V3-V6. Kim [name] reviewed with Dr. Lovena Le and it was felt that continue monitoring was appropriate since the patient did not have any chest pain.   Phone note on 07/23/15: she was having low BPs and dizziness. She was told to stop her lasix and lisinopril and call if having increased SOB.  Phone note on 08/02/15: she had three episodes of chest pain and BP as low as 96/62 and as high as 150/100. Patient added onto my FLEX schedule.   Yesterday she was having intermittent belching and chest pressure. She went to the ER last night and they troponin neg x2 and she was sent home on prilosec. She hasn't had anymore chest pain since leaving the ER. Her SOB is improved but she had chronic SOB due to COPD. She has never had heartburn before. No LE edema, orthopnea or PND. No more dizziness or syncope. Has been only taking lisinopril if SBP >100.   Past Medical History  Diagnosis Date  . C8 RADICULOPATHY 06/05/2009  . COPD 08/31/2007  . DEPRESSION 11/19/2006  . DYSLIPIDEMIA 03/12/2009  . 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 OSTEOARTHRITIS 06/05/2009  . PARESTHESIA 08/24/2007  . TOBACCO ABUSE 01/25/2010  . MI (mitral incompetence)     Past Surgical History  Procedure Laterality Date  . Appendectomy  1969  . Cholecystectomy  1989  . Exploratory laparotomy  1969  . Collapse lung  1990  . Cardiac catheterization N/A 07/05/2015    Procedure: Left Heart Cath and Coronary Angiography;  Surgeon: Leonie Man, MD;  Location: [facility];  Service: Cardiovascular;  Laterality: N/A;  Current Medications: Outpatient Prescriptions Prior to Visit  Medication Sig Dispense Refill  . albuterol (PROVENTIL HFA;VENTOLIN HFA) 108 (90 BASE) MCG/ACT inhaler Inhale 2 puffs into the lungs every 4 (four) hours as needed for wheezing or shortness of breath.    Marland Kitchen albuterol (PROVENTIL) (2.5 MG/3ML) 0.083% nebulizer solution Take 2.5 mg by nebulization every 6 (six) hours as needed for wheezing or shortness of breath.     Marland Kitchen aspirin EC 81 MG EC tablet Take 1 tablet (81 mg total) by mouth daily.    . budesonide-formoterol (SYMBICORT) 160-4.5 MCG/ACT inhaler Inhale 2 puffs into the lungs 2 (two) times daily. 1 Inhaler 5  . carvedilol (COREG) 3.125 MG tablet Take 1 tablet (3.125 mg total) by mouth 2 (two) times daily with a meal. 60 tablet 5  . Cholecalciferol (VITAMIN D3) 1000 UNITS CAPS Take 1,000 Units by mouth 2 (two) times daily.   0  . lisinopril (PRINIVIL,ZESTRIL) 2.5 MG tablet Take 1 tablet (2.5 mg total) by mouth daily. 90 tablet 2  . nystatin (MYCOSTATIN) 100000 UNIT/ML suspension Take 5 mLs by mouth 4 (four) times daily as needed (thrush from Symbicort).     Marland Kitchen omeprazole (PRILOSEC) 20 MG capsule Take 1 capsule (20 mg total) by mouth daily. 30 capsule 0  . PAXIL CR 25 MG 24 hr tablet Take 1 tablet by mouth  every morning 90 tablet 1  . tiotropium (SPIRIVA) 18 MCG inhalation capsule Place 18 mcg into inhaler and inhale daily with supper.    . Kim ([name]) 0.5 MG tablet TAKE 1/2 TO 1 TABLET BY MOUTH 3 TIMES A DAY AS NEEDED (Patient taking differently: TAKE 1/2 TABLET BY MOUTH DAILY AT BEDTIME, MAY TAKE 1/2-1 2 MORE TIMES DAILY AS NEEDED FOR ANXIETY/PANIC ATTACKS) 90 tablet 2  . HYDROcodone-acetaminophen (NORCO) 7.5-325 MG tablet Take 1 tablet by mouth every 6 (six) hours as needed for moderate pain. (Patient taking differently: Take 0.5-1 tablets by mouth every 6 (six) hours as needed for moderate pain. ) 90 tablet 0  . pravastatin (PRAVACHOL) 20 MG tablet Take 1 tablet (20 mg total) by mouth every evening. (Patient taking differently: Take 20 mg by mouth daily with supper. ) 30 tablet 5   No facility-administered medications prior to visit.     Allergies:   Penicillins   Social History   Social History  . Marital Status: Married    Spouse Name: N/A  . Number of Children: 2  . Years of Education: N/A   Occupational History  . Patient care aide-sits with elderly lady    Social History Main  Topics  . Smoking status: Former Smoker -- 1.50 packs/day for 30 years    Types: Cigarettes    Quit date: 12/21/2013  . Smokeless tobacco: Never Used  . Alcohol Use: No  . Drug Use: No  . Sexual Activity: Not Asked   Other Topics Concern  . None   Social History Narrative     Family History:  The patient's family history includes Alcohol abuse in her other; Arthritis in her other; Cancer in her mother and other; Diabetes in her other and sister; Heart attack in her maternal uncle, mother, other, and other; Heart disease in her other; Hypertension in her brother and sister; Kidney disease in her other; Melanoma in her other; Multiple sclerosis in her brother and other; Rheum arthritis in her sister; Stroke in her maternal grandmother and other; Thyroid disease in her sister. There is no history of Colon cancer.   ROS:  Please see the history of present illness.    ROS All other systems reviewed and are negative.   PHYSICAL EXAM:   VS:  BP 115/60 mmHg  Pulse 64  Ht 5\' 3"  (1.6 m)  Wt 125 lb 12.8 oz (57.063 kg)  BMI 22.29 kg/m2   GEN: Well nourished, well developed, in no acute distress HEENT: normal Neck: no JVD, carotid bruits, or masses Cardiac: RRR; no murmurs, rubs, or gallops,no edema  Respiratory:  clear to auscultation bilaterally, normal work of breathing GI: soft, nontender, nondistended, + BS MS: no deformity or atrophy Skin: warm and dry, no rash Neuro:  Alert and Oriented x 3, Strength and sensation are intact Psych: euthymic mood, full affect  Wt Readings from Last 3 Encounters:  08/03/15 125 lb 12.8 oz (57.063 kg)  08/01/15 126 lb (57.153 kg)  07/24/15 124 lb 6.4 oz (56.427 kg)      Studies/Labs Reviewed:   EKG:  EKG is ordered today.  The ekg ordered today demonstrates persistent TWIs in inferior lead and V3-v6, but better than before  Recent Labs: 06/24/2015: TSH 0.489 07/07/2015: B Natriuretic Peptide 330.4* 07/08/2015: ALT 29 07/19/2015: Magnesium  2.0 08/02/2015: BUN 20; Creatinine, Ser 0.89; Hemoglobin 12.6; Platelets 374; Potassium 3.9; Sodium 139   Lipid Panel    Component Value Date/Time   CHOL 216* 07/07/2015 0420   TRIG 211* 07/07/2015 0420   HDL 79 07/07/2015 0420   CHOLHDL 2.7 07/07/2015 0420   VLDL 42* 07/07/2015 0420   LDLCALC 95 07/07/2015 0420   LDLDIRECT 155.4 12/29/2012 1209    Additional studies/ records that were reviewed today include:  cath 07/25/15 1. Angiographically minimal coronary disease 2. There is severe left ventricular systolic dysfunction. 3. 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  Recommendations:  Return the CCU/TCU for post radial cath care and ongoing treatment of nonischemic cardiomyopathy  Continue aggressive risk modification and diuresis.  Treat underlying cause for stress induced cardiomyopathy   ECHO: 07/06/15 Study Conclusions - Left ventricle: The cavity size was normal. Systolic function was  severely reduced. The estimated ejection fraction was in the  range of 25% to 30%. Wall motion was normal; there were no  regional wall motion abnormalities. Doppler parameters are  consistent with abnormal left ventricular relaxation (grade 1  diastolic dysfunction). There was no evidence of elevated  ventricular filling pressure by Doppler parameters. - Aortic valve: There was moderate regurgitation. - Aortic root: The aortic root was normal in size. - Left atrium: The atrium was normal in size. - Right ventricle: The cavity size was normal. Wall thickness was  normal. Systolic function was normal. - Right atrium: The atrium was normal in size. - Tricuspid valve: There was trivial regurgitation. - Pulmonary arteries: Systolic pressure was within the normal  range. - Pericardium, extracardiac: A trivial pericardial effusion was  identified posterior to the heart. Features were not  consistent  with tamponade physiology. Impressions: - There is akinesis of all of the apical segments, hypokinesis of  the mid segments and normal motion of the basal segments  consistent with stress induced cardiomyopathy (Tako-tsubo).  LVEF is severely decreased estimated at 25-30%.    ASSESSMENT:    1. Takotsubo syndrome   2. Chest pain, unspecified chest pain type   3. Labile hypertension   4. Acute combined systolic and diastolic congestive heart failure ([place])   5. Chronic obstructive pulmonary disease, unspecified COPD type ([place])   6. Abnormal EKG  7. Bradycardia      PLAN:  In order of problems listed above:  Chest pain: now resolved. Will give her a Rx for SL NTG if it recurrs.   Labile BPs: She is taking coreg 3.25mg  BID everyday. She takes lisinopril 2.5mg  if SBP >100. This has been working well.   Acute combined systolic and diastolic [id number] to weigh daily and monitor for s/s CHF. Will take lasix if any s/s of CHF which we went over in detail or weight gain.  Takotsubo's CM/NICM:  LVEF 15 to 20%. Currently only on BB. ACEi and lasix was discontinued due to hypotension and dizziness. Now taking ACE if SBP >100 and will take lasix if she has s/s CHF. Will need repeat LV function eval in 2-3 months  COPD: closely followed by pulmonary  Previous abnormal EKG: ECG today with persistent TWIs in inferior lead and V3-v6, but better than before. No more chest pain  Bradycardia: continue 2 week monitor  Non obst CAD at cath, continue statin and ASA.   Medication Adjustments/Labs and Tests Ordered: Current medicines are reviewed at length with the patient today.  Concerns regarding medicines are outlined above.  Medication changes, Labs and Tests ordered today are listed in the Patient Instructions below. Patient Instructions  Medication Instructions:  Your physician has recommended you make the following change in your medication:  1.  START taking  Nitroglycerin 0.4 s/l tablets.  Use as directed on the bottle  Labwork: None ordered  Testing/Procedures: None ordered  Follow-Up: Your physician recommends that you schedule a follow-up appointment in: [place] 08/13/15   Any Other Special Instructions Will Be Listed Below (If Applicable).   If you need a refill on your cardiac medications before your next appointment, please call your pharmacy.       Renea Ee  08/03/2015 10:11 AM    [address], [place], [place]  [address] Phone: [phone]; Fax: [phone]

## 2015-08-02 NOTE — ED Notes (Signed)
Pt reports intermittent chest pain that has been going on today. Pt states that it starts while sitting and last approx 15 minutes. This has happened 4 times. Pt states that her BP has also been elevated today.

## 2015-08-02 NOTE — Telephone Encounter (Signed)
Patient calling complaining of chest pain. Patient denies any SOB at this time. Patient was resting at time of pain, and it lasted about 15 minutes. Patient stated it happened 3 times. Patient stated she is not having chest pain at this time. Patient is concerned about her BP as well. Patient stated her BP is being as low as 96/62 after taking lisinopril 2.5 mg and as high as 150/100. Put patient on FLEX schedule tomorrow to be evaluated and encouraged patient to go to ED if her chest pain comes back. Patient verbalized understanding, but would prefer to wait and see someone tomorrow.

## 2015-08-02 NOTE — ED Provider Notes (Signed)
CSN: [id number]     Arrival date & time 08/02/15  1848 History   First MD Initiated Contact with Patient 08/02/15 2100     Chief Complaint  Patient presents with  . Chest Pain    Kim [name] is a 60 y.o. female with a history of NICM, COPD, and former smoker who presents to the ED complaining Of substernal nonradiating chest pain intermittently starting today. Patient reports while at rest today around 10 AM she began having substernal nonradiating chest pain. She reports her pain would last approximately 15 minutes then resolve on its own. She is unable to identify any alleviating or aggravating factors. She's had no shortness of breath. She does report she has had lots of burping and belching today. Patient reports she's had 4 episodes of intermittent chest pain today all lasting approximately 15 minutes. Her last episode of chest pain was around 5 PM today. She's had no chest pain since. She is currently chest pain-free. She denies any coughing or shortness of breath. Patient was admitted last month for a COPD exacerbation and respiratory failure. Patient had a cardiac cath at that time that showed an ejection fraction of 30% as well as essentially clear coronary arteries. She denies fevers, shortness of breath, coughing, abdominal pain, nausea, vomiting, diarrhea, leg pain, leg swelling, rashes, lightheadedness, orthopnea, dizziness, or syncope.  Patient is a 60 y.o. female presenting with chest pain. The history is provided by the patient. No language interpreter was used.  Chest Pain Associated symptoms: no abdominal pain, no back pain, no cough, no fever, no headache, no nausea, no numbness, no palpitations, no shortness of breath and not vomiting     Past Medical History  Diagnosis Date  . C8 RADICULOPATHY 06/05/2009  . COPD 08/31/2007  . DEPRESSION 11/19/2006  . DYSLIPIDEMIA 03/12/2009  . 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 OSTEOARTHRITIS 06/05/2009  . PARESTHESIA 08/24/2007  . TOBACCO ABUSE  01/25/2010  . MI (mitral incompetence)    Past Surgical History  Procedure Laterality Date  . Appendectomy  1969  . Cholecystectomy  1989  . Exploratory laparotomy  1969  . Collapse lung  1990  . Cardiac catheterization N/A 07/05/2015    Procedure: Left Heart Cath and Coronary Angiography;  Surgeon: Leonie Man, MD;  Location: [facility];  Service: Cardiovascular;  Laterality: N/A;   Family History  Problem Relation Age of Onset  . Cancer Mother     lung  . Heart attack Mother   . Hypertension Sister   . Multiple sclerosis Brother   . Multiple sclerosis Other   . Alcohol abuse Other   . Arthritis Other   . Diabetes Other   . Kidney disease Other   . Cancer Other     lung,ovarian,skin, uterine  . Stroke Other   . Heart disease Other   . Melanoma Other   . Diabetes Sister   . Rheum arthritis Sister   . Thyroid disease Sister   . Colon cancer Neg Hx   . Heart attack Maternal Uncle   . Heart attack Other     NEICE  . Heart attack Other     NEPHEW  . Hypertension Brother   . Stroke Maternal Grandmother    Social History  Substance Use Topics  . Smoking status: Former Smoker -- 1.50 packs/day for 30 years    Types: Cigarettes    Quit date: 12/21/2013  . Smokeless tobacco: Never Used  . Alcohol Use: No   OB History  No data available     Review of Systems  Constitutional: Negative for fever and chills.  HENT: Negative for congestion and sore throat.   Eyes: Negative for visual disturbance.  Respiratory: Negative for cough, shortness of breath and wheezing.   Cardiovascular: Positive for chest pain. Negative for palpitations and leg swelling.  Gastrointestinal: Negative for nausea, vomiting, abdominal pain and diarrhea.  Genitourinary: Negative for dysuria.  Musculoskeletal: Negative for back pain and neck pain.  Skin: Negative for rash.  Neurological: Negative for syncope, light-headedness, numbness and headaches.      Allergies   Penicillins  Home Medications   Prior to Admission medications   Medication Sig Start Date End Date Taking? Authorizing Provider  albuterol (PROVENTIL HFA;VENTOLIN HFA) 108 (90 BASE) MCG/ACT inhaler Inhale 2 puffs into the lungs every 4 (four) hours as needed for wheezing or shortness of breath.   Yes Historical Provider, MD  albuterol (PROVENTIL) (2.5 MG/3ML) 0.083% nebulizer solution Take 2.5 mg by nebulization every 6 (six) hours as needed for wheezing or shortness of breath.   Yes Historical Provider, MD  ALPRAZolam (XANAX) 0.5 MG tablet TAKE 1/2 TO 1 TABLET BY MOUTH 3 TIMES A DAY AS NEEDED Patient taking differently: TAKE 1/2 TABLET BY MOUTH DAILY AT BEDTIME, MAY TAKE 1/2-1 2 MORE TIMES DAILY AS NEEDED FOR ANXIETY/PANIC ATTACKS 04/11/15  Yes Marletta Lor, MD  aspirin EC 81 MG EC tablet Take 1 tablet (81 mg total) by mouth daily. 07/08/15  Yes Nita Sells, MD  budesonide-formoterol (SYMBICORT) 160-4.5 MCG/ACT inhaler Inhale 2 puffs into the lungs 2 (two) times daily. 06/28/15  Yes Reyne Dumas, MD  carvedilol (COREG) 3.125 MG tablet Take 1 tablet (3.125 mg total) by mouth 2 (two) times daily with a meal. 07/19/15  Yes Isaiah Serge, NP  Cholecalciferol (VITAMIN D3) 1000 UNITS CAPS Take 1,000 Units by mouth 2 (two) times daily.  04/01/14  Yes Historical Provider, MD  HYDROcodone-acetaminophen (NORCO) 7.5-325 MG tablet Take 1 tablet by mouth every 6 (six) hours as needed for moderate pain. Patient taking differently: Take 0.5-1 tablets by mouth every 6 (six) hours as needed for moderate pain.  08/01/15  Yes Marletta Lor, MD  lisinopril (PRINIVIL,ZESTRIL) 2.5 MG tablet Take 1 tablet (2.5 mg total) by mouth daily. 08/01/15  Yes Marletta Lor, MD  nystatin (MYCOSTATIN) 100000 UNIT/ML suspension Take 5 mLs by mouth 4 (four) times daily as needed (thrush from Symbicort).    Yes Historical Provider, MD  PAXIL CR 25 MG 24 hr tablet Take 1 tablet by mouth  every morning 05/04/15   Yes Marletta Lor, MD  pravastatin (PRAVACHOL) 20 MG tablet Take 1 tablet (20 mg total) by mouth every evening. Patient taking differently: Take 20 mg by mouth daily with supper.  07/19/15  Yes Isaiah Serge, NP  tiotropium (SPIRIVA) 18 MCG inhalation capsule Place 18 mcg into inhaler and inhale daily with supper.   Yes Historical Provider, MD  omeprazole (PRILOSEC) 20 MG capsule Take 1 capsule (20 mg total) by mouth daily. 08/03/15   Waynetta Pean, PA-C   BP 113/56 mmHg  Pulse 61  Temp(Src) 98 F (36.7 C) (Oral)  Resp 17  SpO2 93% Physical Exam  Constitutional: She appears well-developed and well-nourished. No distress.  Nontoxic appearing.  HENT:  Head: Normocephalic and atraumatic.  Mouth/Throat: Oropharynx is clear and moist.  Eyes: Conjunctivae are normal. Pupils are equal, round, and reactive to light. Right eye exhibits no discharge. Left eye exhibits no discharge.  Neck: Normal range of motion. Neck supple. No JVD present. No tracheal deviation present.  Cardiovascular: Normal rate, regular rhythm, normal heart sounds and intact distal pulses.  Exam reveals no gallop and no friction rub.   No murmur heard. Bilateral radial, posterior tibialis and dorsalis pedis pulses are intact.    Pulmonary/Chest: Effort normal and breath sounds normal. No stridor. No respiratory distress. She has no wheezes. She has no rales.  Lungs clear to auscultation bilaterally. No increased work of breathing.   Abdominal: Soft. She exhibits no distension. There is no tenderness. There is no guarding.  Musculoskeletal: She exhibits no edema or tenderness.  No lower extremity edema or tenderness.  Lymphadenopathy:    She has no cervical adenopathy.  Neurological: She is alert. Coordination normal.  Skin: Skin is warm and dry. No rash noted. She is not diaphoretic. No erythema. No pallor.  Psychiatric: She has a normal mood and affect. Her behavior is normal.  Nursing note and vitals  reviewed.   ED Course  Procedures (including critical care time) Labs Review Labs Reviewed  BASIC METABOLIC PANEL - Abnormal; Notable for the following:    Glucose, Bld 109 (*)    All other components within normal limits  CBC  I-STAT TROPOININ, ED  Randolm Idol, ED    Imaging Review Dg Chest 2 View  08/02/2015  CLINICAL DATA:  Chest pain and abnormal increase chest pressure today. EXAM: CHEST  2 VIEW COMPARISON:  July 06, 2015 FINDINGS: The heart size and mediastinal contours are within normal limits. There is no focal infiltrate, pulmonary edema, or pleural effusion. The visualized skeletal structures are unremarkable. IMPRESSION: No active cardiopulmonary disease. Electronically Signed   By: Abelardo Diesel M.D.   On: 08/02/2015 19:41   I have personally reviewed and evaluated these images and lab results as part of my medical decision-making.   EKG Interpretation None      Filed Vitals:   08/02/15 2215 08/02/15 2230 08/02/15 2315 08/02/15 2330  BP:  136/67  113/56  Pulse: 75  68 61  Temp:      TempSrc:      Resp: 18 16 19 17   SpO2: 96%  96% 93%     MDM   Meds given in ED:  Medications  gi cocktail (Maalox,Lidocaine,Donnatal) (30 mLs Oral Given 08/02/15 2215)  dicyclomine (BENTYL) capsule 10 mg (10 mg Oral Given 08/02/15 2356)  simethicone (MYLICON) chewable tablet 80 mg (80 mg Oral Given 08/02/15 2356)  famotidine (PEPCID) tablet 20 mg (20 mg Oral Given 08/02/15 2355)    New Prescriptions   OMEPRAZOLE (PRILOSEC) 20 MG CAPSULE    Take 1 capsule (20 mg total) by mouth daily.    Final diagnoses:  Gastroesophageal reflux disease, esophagitis presence not specified  Chest pain, unspecified chest pain type   This is a 60 y.o. female with a history of NICM, COPD, and former smoker who presents to the ED complaining Of substernal nonradiating chest pain intermittently starting today. Patient reports while at rest today around 10 AM she began having substernal  nonradiating chest pain. She reports her pain would last approximately 15 minutes then resolve on its own. She is unable to identify any alleviating or aggravating factors. She's had no shortness of breath. She does report she has had lots of burping and belching today. Patient reports she's had 4 episodes of intermittent chest pain today all lasting approximately 15 minutes. Her last episode of chest pain was around 5 PM today. She's had  no chest pain since. She is currently chest pain-free. She denies any coughing or shortness of breath. Patient was admitted last month for a COPD exacerbation and respiratory failure. Patient had a cardiac cath at that time that showed an ejection fraction of 30% as well as essentially clear coronary arteries. On exam the patient is afebrile nontoxic appearing. Lungs clear to auscultation bilaterally. She has no tachypnea, hypoxia or tachycardia. Abdomen is soft and nontender. She is status post cholecystectomy. Patient is burping during exam. No extremity edema. Initial troponin is not elevated. BMP and CBC are unremarkable. Chest x-ray is unremarkable. Will provide with GI cocktail and check a delta troponin. Patient then had return of chest pain. She reports she's had lots of belching. She reports belching helps her pain.  At evaluation patient is belching. We'll provide with Bentyl, simethicone and Pepcid. Delta troponin is not elevated. Patient had complete relief of her chest pain after medications for gas and acid reflux. With troponin and delta troponin both not elevated as well as unremarkable chest x-ray and EKG, will discharge with close follow up with cardiology and PCP. She has follow-up with cardiology in the morning. Will start on omeprazole. I discussed strict in specific return precautions related to her chest pain. I advised the patient to follow-up with their primary care provider this week. I advised the patient to return to the emergency department with  new or worsening symptoms or new concerns. The patient verbalized understanding and agreement with plan.    This patient was discussed with Dr. Lita Mains who agrees with assessment and plan.     Waynetta Pean, PA-C 08/03/15 [id number]  Julianne Rice, MD 08/06/15 [phone]

## 2015-08-02 NOTE — Telephone Encounter (Signed)
Pt c/o of Chest Pain: STAT if CP now or developed within 24 hours  1. Are you having CP right now? No=- just stoped ten minutes ago  2. Are you experiencing any other symptoms (ex. SOB, nausea, vomiting, sweating)? no  3. How long have you been experiencing CP? Today   4. Is your CP continuous or coming and going? Comes/goes- three different times today- 4/27  5. Have you taken Nitroglycerin? no ?

## 2015-08-02 NOTE — ED Notes (Addendum)
Pt reports that her chest pain is back, 5 out of 10, center chest.  She reports that it feels like "I need to burp".  She began burping, which is relieving the pain "a tiny bit".  Provider notified

## 2015-08-03 ENCOUNTER — Ambulatory Visit (INDEPENDENT_AMBULATORY_CARE_PROVIDER_SITE_OTHER): Payer: 59 | Admitting: Physician Assistant

## 2015-08-03 ENCOUNTER — Encounter: Payer: Self-pay | Admitting: Physician Assistant

## 2015-08-03 VITALS — BP 115/60 | HR 64 | Ht 63.0 in | Wt 125.8 lb

## 2015-08-03 DIAGNOSIS — I5181 Takotsubo syndrome: Secondary | ICD-10-CM | POA: Diagnosis not present

## 2015-08-03 DIAGNOSIS — R0989 Other specified symptoms and signs involving the circulatory and respiratory systems: Secondary | ICD-10-CM

## 2015-08-03 DIAGNOSIS — I10 Essential (primary) hypertension: Secondary | ICD-10-CM

## 2015-08-03 DIAGNOSIS — R9431 Abnormal electrocardiogram [ECG] [EKG]: Secondary | ICD-10-CM

## 2015-08-03 DIAGNOSIS — R079 Chest pain, unspecified: Secondary | ICD-10-CM

## 2015-08-03 DIAGNOSIS — I5041 Acute combined systolic (congestive) and diastolic (congestive) heart failure: Secondary | ICD-10-CM

## 2015-08-03 DIAGNOSIS — J449 Chronic obstructive pulmonary disease, unspecified: Secondary | ICD-10-CM

## 2015-08-03 DIAGNOSIS — R001 Bradycardia, unspecified: Secondary | ICD-10-CM

## 2015-08-03 MED ORDER — NITROGLYCERIN 0.4 MG SL SUBL: 0.4000 mg | SUBLINGUAL_TABLET | SUBLINGUAL | Status: DC | PRN

## 2015-08-03 MED ORDER — OMEPRAZOLE 20 MG PO CPDR: 20.0000 mg | DELAYED_RELEASE_CAPSULE | Freq: Every day | ORAL | Status: DC

## 2015-08-03 NOTE — Patient Instructions (Signed)
Medication Instructions:  Your physician has recommended you make the following change in your medication:  1.  START taking Nitroglycerin 0.4 s/l tablets.  Use as directed on the bottle  Labwork: None ordered  Testing/Procedures: None ordered  Follow-Up: Your physician recommends that you schedule a follow-up appointment in: [place] 08/13/15   Any Other Special Instructions Will Be Listed Below (If Applicable).   If you need a refill on your cardiac medications before your next appointment, please call your pharmacy.

## 2015-08-03 NOTE — Discharge Instructions (Signed)
Gastroesophageal Reflux Disease, Adult Normally, food travels down the esophagus and stays in the stomach to be digested. However, when a person has gastroesophageal reflux disease (GERD), food and stomach acid move back up into the esophagus. When this happens, the esophagus becomes sore and inflamed. Over time, GERD can create small holes (ulcers) in the lining of the esophagus.  CAUSES This condition is caused by a problem with the muscle between the esophagus and the stomach (lower esophageal sphincter, or LES). Normally, the LES muscle closes after food passes through the esophagus to the stomach. When the LES is weakened or abnormal, it does not close properly, and that allows food and stomach acid to go back up into the esophagus. The LES can be weakened by certain dietary substances, medicines, and medical conditions, including:  Tobacco use.  Pregnancy.  Having a hiatal hernia.  Heavy alcohol use.  Certain foods and beverages, such as coffee, chocolate, onions, and peppermint. RISK FACTORS This condition is more likely to develop in:  People who have an increased body weight.  People who have connective tissue disorders.  People who use NSAID medicines. SYMPTOMS Symptoms of this condition include:  Heartburn.  Difficult or painful swallowing.  The feeling of having a lump in the throat.  Abitter taste in the mouth.  Bad breath.  Having a large amount of saliva.  Having an upset or bloated stomach.  Belching.  Chest pain.  Shortness of breath or wheezing.  Ongoing (chronic) cough or a night-time cough.  Wearing away of tooth enamel.  Weight loss. Different conditions can cause chest pain. Make sure to see your health care provider if you experience chest pain. DIAGNOSIS Your health care provider will take a medical history and perform a physical exam. To determine if you have mild or severe GERD, your health care provider may also monitor how you respond  to treatment. You may also have other tests, including:  An endoscopy toexamine your stomach and esophagus with a small camera.  A test thatmeasures the acidity level in your esophagus.  A test thatmeasures how much pressure is on your esophagus.  A barium swallow or modified barium swallow to show the shape, size, and functioning of your esophagus. TREATMENT The goal of treatment is to help relieve your symptoms and to prevent complications. Treatment for this condition may vary depending on how severe your symptoms are. Your health care provider may recommend:  Changes to your diet.  Medicine.  Surgery. HOME CARE INSTRUCTIONS Diet  Follow a diet as recommended by your health care provider. This may involve avoiding foods and drinks such as:  Coffee and tea (with or without caffeine).  Drinks that containalcohol.  Energy drinks and sports drinks.  Carbonated drinks or sodas.  Chocolate and cocoa.  Peppermint and mint flavorings.  Garlic and onions.  Horseradish.  Spicy and acidic foods, including peppers, chili powder, curry powder, vinegar, hot sauces, and barbecue sauce.  Citrus fruit juices and citrus fruits, such as oranges, lemons, and limes.  Tomato-based foods, such as red sauce, chili, salsa, and pizza with red sauce.  Fried and fatty foods, such as donuts, french fries, potato chips, and high-fat dressings.  High-fat meats, such as hot dogs and fatty cuts of red and white meats, such as rib eye steak, sausage, ham, and bacon.  High-fat dairy items, such as whole milk, butter, and cream cheese.  Eat small, frequent meals instead of large meals.  Avoid drinking large amounts of liquid with your  meals. °· Avoid eating meals during the 2-3 hours before bedtime. °· Avoid lying down right after you eat. °· Do not exercise right after you eat. ° General Instructions  °· Pay attention to any changes in your symptoms. °· Take over-the-counter and prescription  medicines only as told by your health care provider. Do not take aspirin, ibuprofen, or other NSAIDs unless your health care provider told you to do so. °· Do not use any tobacco products, including cigarettes, chewing tobacco, and e-cigarettes. If you need help quitting, ask your health care provider. °· Wear loose-fitting clothing. Do not wear anything tight around your waist that causes pressure on your abdomen. °· Raise (elevate) the head of your bed 6 inches (15cm). °· Try to reduce your stress, such as with yoga or meditation. If you need help reducing stress, ask your health care provider. °· If you are overweight, reduce your weight to an amount that is healthy for you. Ask your health care provider for guidance about a safe weight loss goal. °· Keep all follow-up visits as told by your health care provider. This is important. °SEEK MEDICAL CARE IF: °· You have new symptoms. °· You have unexplained weight loss. °· You have difficulty swallowing, or it hurts to swallow. °· You have wheezing or a persistent cough. °· Your symptoms do not improve with treatment. °· You have a hoarse voice. °SEEK IMMEDIATE MEDICAL CARE IF: °· You have pain in your arms, neck, jaw, teeth, or back. °· You feel sweaty, dizzy, or light-headed. °· You have chest pain or shortness of breath. °· You vomit and your vomit looks like blood or coffee grounds. °· You faint. °· Your stool is bloody or black. °· You cannot swallow, drink, or eat. °  °This information is not intended to replace advice given to you by your health care provider. Make sure you discuss any questions you have with your health care provider. °  °Document Released: 01/01/2005 Document Revised: 12/13/2014 Document Reviewed: 07/19/2014 °Elsevier Interactive Patient Education ©2016 [employer] °Nonspecific Chest Pain  °Chest pain can be caused by many different conditions. There is always a chance that your pain could be related to something serious, such as a heart  attack or a blood clot in your lungs. Chest pain can also be caused by conditions that are not life-threatening. If you have chest pain, it is very important to follow up with your health care provider. °CAUSES  °Chest pain can be caused by: °· Heartburn. °· Pneumonia or bronchitis. °· Anxiety or stress. °· Inflammation around your heart (pericarditis) or lung (pleuritis or pleurisy). °· A blood clot in your lung. °· A collapsed lung (pneumothorax). It can develop suddenly on its own (spontaneous pneumothorax) or from trauma to the chest. °· Shingles infection (varicella-zoster virus). °· Heart attack. °· Damage to the bones, muscles, and cartilage that make up your chest wall. This can include: °· Bruised bones due to injury. °· Strained muscles or cartilage due to frequent or repeated coughing or overwork. °· Fracture to one or more ribs. °· Sore cartilage due to inflammation (costochondritis). °RISK FACTORS  °Risk factors for chest pain may include: °· Activities that increase your risk for trauma or injury to your chest. °· Respiratory infections or conditions that cause frequent coughing. °· Medical conditions or overeating that can cause heartburn. °· Heart disease or family history of heart disease. °· Conditions or health behaviors that increase your risk of developing a blood clot. °· Having had chicken pox (  varicella zoster). °SIGNS AND SYMPTOMS °Chest pain can feel like: °· Burning or tingling on the surface of your chest or deep in your chest. °· Crushing, pressure, aching, or squeezing pain. °· Dull or sharp pain that is worse when you move, cough, or take a deep breath. °· Pain that is also felt in your back, neck, shoulder, or arm, or pain that spreads to any of these areas. °Your chest pain may come and go, or it may stay constant. °DIAGNOSIS °Lab tests or other studies may be needed to find the cause of your pain. Your health care provider may have you take a test called an ambulatory ECG  (electrocardiogram). An ECG records your heartbeat patterns at the time the test is performed. You may also have other tests, such as: °· Transthoracic echocardiogram (TTE). During echocardiography, sound waves are used to create a picture of all of the heart structures and to look at how blood flows through your heart. °· Transesophageal echocardiogram (TEE). This is a more advanced imaging test that obtains images from inside your body. It allows your health care provider to see your heart in finer detail. °· Cardiac monitoring. This allows your health care provider to monitor your heart rate and rhythm in real time. °· Holter monitor. This is a portable device that records your heartbeat and can help to diagnose abnormal heartbeats. It allows your health care provider to track your heart activity for several days, if needed. °· Stress tests. These can be done through exercise or by taking medicine that makes your heart beat more quickly. °· Blood tests. °· Imaging tests. °TREATMENT  °Your treatment depends on what is causing your chest pain. Treatment may include: °· Medicines. These may include: °· Acid blockers for heartburn. °· Anti-inflammatory medicine. °· Pain medicine for inflammatory conditions. °· Antibiotic medicine, if an infection is present. °· Medicines to dissolve blood clots. °· Medicines to treat coronary artery disease. °· Supportive care for conditions that do not require medicines. This may include: °· Resting. °· Applying heat or cold packs to injured areas. °· Limiting activities until pain decreases. °HOME CARE INSTRUCTIONS °· If you were prescribed an antibiotic medicine, finish it all even if you start to feel better. °· Avoid any activities that bring on chest pain. °· Do not use any tobacco products, including cigarettes, chewing tobacco, or electronic cigarettes. If you need help quitting, ask your health care provider. °· Do not drink alcohol. °· Take medicines only as directed by  your health care provider. °· Keep all follow-up visits as directed by your health care provider. This is important. This includes any further testing if your chest pain does not go away. °· If heartburn is the cause for your chest pain, you may be told to keep your head raised (elevated) while sleeping. This reduces the chance that acid will go from your stomach into your esophagus. °· Make lifestyle changes as directed by your health care provider. These may include: °¨ Getting regular exercise. Ask your health care provider to suggest some activities that are safe for you. °¨ Eating a heart-healthy diet. A registered dietitian can help you to learn healthy eating options. °¨ Maintaining a healthy weight. °¨ Managing diabetes, if necessary. °¨ Reducing stress. °SEEK MEDICAL CARE IF: °· Your chest pain does not go away after treatment. °· You have a rash with blisters on your chest. °· You have a fever. °SEEK IMMEDIATE MEDICAL CARE IF:  °· Your chest pain is worse. °· You   have an increasing cough, or you cough up blood. °· You have severe abdominal pain. °· You have severe weakness. °· You faint. °· You have chills. °· You have sudden, unexplained chest discomfort. °· You have sudden, unexplained discomfort in your arms, back, neck, or jaw. °· You have shortness of breath at any time. °· You suddenly start to sweat, or your skin gets clammy. °· You feel nauseous or you vomit. °· You suddenly feel light-headed or dizzy. °· Your heart begins to beat quickly, or it feels like it is skipping beats. °These symptoms may represent a serious problem that is an emergency. Do not wait to see if the symptoms will go away. Get medical help right away. Call your local emergency services (911 in the [place] Do not drive yourself to the hospital. °  °This information is not intended to replace advice given to you by your health care provider. Make sure you discuss any questions you have with your health care provider. °  °Document  Released: 01/01/2005 Document Revised: 04/14/2014 Document Reviewed: 10/28/2013 °Elsevier Interactive Patient Education ©2016 [employer] Inc[employer] ° °

## 2015-08-07 ENCOUNTER — Telehealth: Payer: Self-pay | Admitting: Cardiovascular Disease

## 2015-08-07 NOTE — Telephone Encounter (Signed)
F/u  Pt stated she received VM last night after 5 to call back. Please call back and discuss.

## 2015-08-07 NOTE — Telephone Encounter (Signed)
Spoke with pt and she states that she had a VM from [place] from yesterday.  Asked pt if the VM was possibly from a different day since Fraser Din was not in the office yesterday. Pt states that the VM was definitely yesterday around 6pm. Advised pt that I would send this message to [facility] for review and see if maybe she called. Pt very appreciative for return call.

## 2015-08-08 NOTE — Telephone Encounter (Signed)
Spoke with pt and told her I have been out of the office since 08/01/15 and did not place call to her on 5/1.  Pt is aware of appt with pulmonary on 5/5, appt with Cecilie Kicks, NP on 5/8 and Dr. Angelena Form on 6/6.  She has no additional questions.

## 2015-08-10 ENCOUNTER — Ambulatory Visit (INDEPENDENT_AMBULATORY_CARE_PROVIDER_SITE_OTHER): Payer: 59 | Admitting: Emergency Medicine

## 2015-08-10 ENCOUNTER — Encounter: Payer: Self-pay | Admitting: Emergency Medicine

## 2015-08-10 VITALS — BP 110/70 | HR 70 | Ht 63.0 in | Wt 127.0 lb

## 2015-08-10 DIAGNOSIS — Z23 Encounter for immunization: Secondary | ICD-10-CM

## 2015-08-10 DIAGNOSIS — J9611 Chronic respiratory failure with hypoxia: Secondary | ICD-10-CM | POA: Diagnosis not present

## 2015-08-10 DIAGNOSIS — J449 Chronic obstructive pulmonary disease, unspecified: Secondary | ICD-10-CM

## 2015-08-10 DIAGNOSIS — J309 Allergic rhinitis, unspecified: Secondary | ICD-10-CM | POA: Diagnosis not present

## 2015-08-10 DIAGNOSIS — R911 Solitary pulmonary nodule: Secondary | ICD-10-CM | POA: Diagnosis not present

## 2015-08-10 MED ORDER — TIOTROPIUM BROMIDE MONOHYDRATE 18 MCG IN CAPS: 18.0000 ug | ORAL_CAPSULE | Freq: Every day | RESPIRATORY_TRACT | Status: DC

## 2015-08-10 MED ORDER — ALBUTEROL SULFATE HFA 108 (90 BASE) MCG/ACT IN AERS: 2.0000 | INHALATION_SPRAY | RESPIRATORY_TRACT | Status: DC | PRN

## 2015-08-10 NOTE — Assessment & Plan Note (Signed)
We will continue her oxygen at 2 L/m with exertion.

## 2015-08-10 NOTE — Progress Notes (Signed)
Subjective:    Patient ID: Kim [name], female    DOB: [birth year], 60 y.o.   MRN: [id number]  HPI 59 yo smoker (48 pk-yrs), hx of COPD formerly seen by Dr Elsworth Soho after a PNA in 2011.  Her spiro in 2011 > FEV1 1.46L (59%), FVC 2.68L (84%), ratio 54%. She has had wt loss 26 lbs over 6 months, increasing dyspnea. She just started chantix and has cut her cig down to 2 per day over the last several days. She has lost the ability to exert. Minimal cough. She does sometimes hear wheeze at night. No other flares except for the one for which she was hospitalized in 2011. She has used albuterol prn > just started  ROV 11/16/14 -- follow up visit for COPD, tobacco use, chronic dyspnea. She also has a history of a 4 mm left lower lobe pulmonary nodule on CT scan of the chest from 11/21/13. I last saw her about a year ago. At that time she was having progressive dyspnea. Returns feeling a bit better. Better exertional tolerance, minimal SABA use. Has gained back about 12 lbs. She has stopped smoking!! She is on spiriva and Symbicort, tolerates well. Last flare was this year, required abx and pred. She had PFT today that I have personally reviewed. FEV1 is 1L (41% pred), volumes hyperinflated, decreased DLCO.  She has daily cough, white mucous. Uses mucinex.   ROV 08/10/15 -- patient with a history of tobacco use, COPD, coronary disease with hypertension and chronic systolic heart failure. She was unfortunately admitted with Flu A respiratory failure ascribed to a CHF exacerbation and possibly a COPD exacerbation in April 2017. She states that she is doing better. Does still have some chest heaviness. She is having a bit of cough nad mucous that she believes is related to pollen. She is on spiriva and symbicort. She is on cetirizine. Uses albuterol occasionally, not every day. Minimal wheeze. Still has exertional SOB. She has O2 at home, does not use it reliably.    CAT Score 11/16/2014  Total CAT Score 27      Review of Systems  Constitutional: Negative for fever and unexpected weight change.  HENT: Negative for congestion, dental problem, ear pain, nosebleeds, postnasal drip, rhinorrhea, sinus pressure, sneezing, sore throat and trouble swallowing.   Eyes: Negative for redness and itching.  Respiratory: Positive for cough and shortness of breath. Negative for chest tightness and wheezing.   Cardiovascular: Negative for palpitations and leg swelling.  Gastrointestinal: Negative for nausea and vomiting.  Genitourinary: Negative for dysuria.  Musculoskeletal: Negative for joint swelling.  Skin: Negative for rash.  Neurological: Negative for headaches.  Hematological: Does not bruise/bleed easily.  Psychiatric/Behavioral: Negative for dysphoric mood. The patient is not nervous/anxious.     Past Medical History  Diagnosis Date  . C8 RADICULOPATHY 06/05/2009  . COPD 08/31/2007  . DEPRESSION 11/19/2006  . DYSLIPIDEMIA 03/12/2009  . 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 OSTEOARTHRITIS 06/05/2009  . PARESTHESIA 08/24/2007  . TOBACCO ABUSE 01/25/2010  . MI (mitral incompetence)      Family History  Problem Relation Age of Onset  . Cancer Mother     lung  . Heart attack Mother   . Hypertension Sister   . Multiple sclerosis Brother   . Multiple sclerosis Other   . Alcohol abuse Other   . Arthritis Other   . Diabetes Other   . Kidney disease Other   . Cancer Other     lung,ovarian,skin, uterine  .  Stroke Other   . Heart disease Other   . Melanoma Other   . Diabetes Sister   . Rheum arthritis Sister   . Thyroid disease Sister   . Colon cancer Neg Hx   . Heart attack Maternal Uncle   . Heart attack Other     NEICE  . Heart attack Other     NEPHEW  . Hypertension Brother   . Stroke Maternal Grandmother      Social History   Social History  . Marital Status: Married    Spouse Name: N/A  . Number of Children: 2  . Years of Education: N/A   Occupational History  . Patient care  aide-sits with elderly lady    Social History Main Topics  . Smoking status: Former Smoker -- 1.50 packs/day for 30 years    Types: Cigarettes    Quit date: 12/21/2013  . Smokeless tobacco: Never Used  . Alcohol Use: No  . Drug Use: No  . Sexual Activity: Not on file   Other Topics Concern  . Not on file   Social History Narrative     Allergies  Allergen Reactions  . Penicillins Hives    Has patient had a PCN reaction causing immediate rash, facial/tongue/throat swelling, SOB or lightheadedness with hypotension: Yes Has patient had a PCN reaction causing severe rash involving mucus membranes or skin necrosis: No Has patient had a PCN reaction that required hospitalization No Has patient had a PCN reaction occurring within the last 10 years: No If all of the above answers are "NO", then may proceed with Cephalosporin use.      Outpatient Prescriptions Prior to Visit  Medication Sig Dispense Refill  . albuterol (PROVENTIL) (2.5 MG/3ML) 0.083% nebulizer solution Take 2.5 mg by nebulization every 6 (six) hours as needed for wheezing or shortness of breath.    . ALPRAZolam (XANAX) 0.5 MG tablet Take 0.5 mg by mouth as directed.    Marland Kitchen aspirin EC 81 MG EC tablet Take 1 tablet (81 mg total) by mouth daily.    . budesonide-formoterol (SYMBICORT) 160-4.5 MCG/ACT inhaler Inhale 2 puffs into the lungs 2 (two) times daily. 1 Inhaler 5  . carvedilol (COREG) 3.125 MG tablet Take 1 tablet (3.125 mg total) by mouth 2 (two) times daily with a meal. 60 tablet 5  . Cholecalciferol (VITAMIN D3) 1000 UNITS CAPS Take 1,000 Units by mouth 2 (two) times daily.   0  . HYDROcodone-acetaminophen (NORCO) 7.5-325 MG tablet Take 1 tablet by mouth every 4 (four) hours as needed for moderate pain.    Marland Kitchen lisinopril (PRINIVIL,ZESTRIL) 2.5 MG tablet Take 1 tablet (2.5 mg total) by mouth daily. 90 tablet 2  . nitroGLYCERIN (NITROSTAT) 0.4 MG SL tablet Place 1 tablet (0.4 mg total) under the tongue every 5 (five)  minutes as needed for chest pain. 25 tablet 3  . nystatin (MYCOSTATIN) 100000 UNIT/ML suspension Take 5 mLs by mouth 4 (four) times daily as needed (thrush from Symbicort).     Marland Kitchen omeprazole (PRILOSEC) 20 MG capsule Take 1 capsule (20 mg total) by mouth daily. 30 capsule 0  . PAXIL CR 25 MG 24 hr tablet Take 1 tablet by mouth  every morning 90 tablet 1  . pravastatin (PRAVACHOL) 20 MG tablet Take 20 mg by mouth every evening.    Marland Kitchen albuterol (PROVENTIL HFA;VENTOLIN HFA) 108 (90 BASE) MCG/ACT inhaler Inhale 2 puffs into the lungs every 4 (four) hours as needed for wheezing or shortness of breath.    Marland Kitchen  tiotropium (SPIRIVA) 18 MCG inhalation capsule Place 18 mcg into inhaler and inhale daily with supper.     No facility-administered medications prior to visit.         Objective:   Physical Exam Filed Vitals:   08/10/15 1431  BP: 110/70  Pulse: 70  Height: 5\' 3"  (1.6 m)  Weight: 127 lb (57.607 kg)  SpO2: 93%   Gen: Pleasant, thin woman, in no distress,  normal affect  ENT: No lesions,  mouth clear,  oropharynx clear, no postnasal drip  Neck: No JVD, no TMG, no carotid bruits  Lungs: No use of accessory muscles, clear on a normal breath, B soft wheeze on a forced expiration.  Cardiovascular: RRR, heart sounds normal, no murmur or gallops, no peripheral edema  Musculoskeletal: No deformities, no cyanosis or clubbing  Neuro: alert, non focal  Skin: Warm, no lesions or rashes  CT chest 11/21/13 --  COMPARISON: Chest x-ray 12/29/2012  FINDINGS:  Mild to moderate centrilobular and paraseptal emphysema. Lungs are  clear. No effusions or nodules.  Heart is normal size. Aorta is normal caliber. Scattered coronary  artery calcifications in the left anterior descending coronary  artery. No mediastinal, hilar, or axillary adenopathy. Chest wall  soft tissues are unremarkable. Imaging into the upper abdomen shows  no acute findings.  IMPRESSION:  Mild to moderate COPD/emphysema.  4 mm  left lower lobe pulmonary nodule. Given risk factors for  bronchogenic carcinoma, follow-up chest CT at 1 year is recommended.       Assessment & Plan:  COPD (chronic obstructive pulmonary disease) ([place]) Please start using your oxygen at 2L/min with walking and exertion.  Continue your spiriva and symbicort as you are taking them  Take albuterol 2 puffs up to every 4 hours if needed for shortness of breath.  Continue cetirizine as you are taking it.  Prevnar-13 today Follow with Dr Lamonte Sakai in 6 months or sooner if you have any problems  Chronic respiratory failure ([place]) We will continue her oxygen at 2 L/m with exertion.  Allergic rhinitis continue cetirizine 10 mg daily  Solitary pulmonary nodule She needs repeat CT scan of the chest in August 2017

## 2015-08-10 NOTE — Assessment & Plan Note (Signed)
She needs repeat CT scan of the chest in August 2017

## 2015-08-10 NOTE — Assessment & Plan Note (Signed)
Please start using your oxygen at 2L/min with walking and exertion.  Continue your spiriva and symbicort as you are taking them  Take albuterol 2 puffs up to every 4 hours if needed for shortness of breath.  Continue cetirizine as you are taking it.  [id number] today Follow with Dr Lamonte Sakai in 6 months or sooner if you have any problems

## 2015-08-10 NOTE — Assessment & Plan Note (Addendum)
continue cetirizine 10 mg daily

## 2015-08-10 NOTE — Patient Instructions (Addendum)
Please start using your oxygen at 2L/min with walking and exertion.  Continue your spiriva and symbicort as you are taking them  Take albuterol 2 puffs up to every 4 hours if needed for shortness of breath.  Continue cetirizine as you are taking it.  [id number] today Follow with Dr Lamonte Sakai in 6 months or sooner if you have any problems

## 2015-08-12 NOTE — Progress Notes (Signed)
Cardiology Office Note   Date:  08/13/2015   ID:  Kim [name], DOB 08-17-1955, MRN [id number]  PCP:  Kim Cowden, MD  Cardiologist:  Dr. Angelena [name]    Chief Complaint  Patient presents with  . Follow-up    for palpitations and holter monitor.   . Chest Pain    pt states small pains maybe palpatations, states she can't explain it   . Shortness of Breath    only when walking      History of Present Illness: Kim [name] is a 60 y.o. female who presents for follow up of holter monitor --SB at 55, +PACs, rare PVCs,  Her HR may seem slow to her due to premature beats.  I discussed results with her.    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  She has a history of COPD and heavy prior smoking history. The patient has coronary artery calcification by CT and luminal irregularities on coronary angiography done in [facility], [place]. At that time, the patient presented with acute respiratory distress complaining of chest pressure. Coronary angiography ultimately demonstrated three-vessel coronary luminal irregularities but no angiographically significant disease. Anterior and lateral wall motion abnormality was noted and surmised to represent a variant of stress cardiomyopathy.  She was hospitalized in early 06/2015 for respiratory distress 2/2 a COPD exacerbation. She was discharged 06/28/15 and then readmitted 07/05/15 after developing dyspnea and chest pressure. Placed on BiPAP, and breathing and chest pressure improved.Cardiac cath was done with minimal CAD but severe LV dysfunction, and severely elevated LVEDP in setting of systemic hypotension. EF 25%. By Echo 25-30%.She was felt to have NICM 2/2 Takotsubo's CM. She was discharged on Coreg and lisinopril as well as lasix.   She was seen by myself on 07/19/15 for  post hospital follow up. She complained of feeling dizzy and SBP was in 80s. Her lisinorpil was decreased down to 2.5mg  daily and Lasix to EOD. She was told to stop monitoring her fluid intake and go by daily weights and swelling. She was also noted to have some bradycardia and a 2 week monitor was placed. Additionally, her ECG showed SR with new deep T wave inversion II,III, AVF, V3-V6. I reviewed with Dr. Lovena [name] DOD and it was felt that continue monitoring was appropriate since the patient did not have any chest pain.   Phone note on 07/23/15: she was having low BPs and dizziness. She was told to stop her lasix and lisinopril and call if having increased SOB.  Phone note on 08/02/15: she had three episodes of chest pain and BP as low as 96/62 and as high as 150/100. Pt seen by K. Grandville Silos PA for intermittent belching and chest pressure. She went to the ER last night and they troponin neg x2 and she was sent home on prilosec. She hasn't had anymore chest pain since leaving the ER. Her SOB is improved but she had chronic SOB due to COPD. She has never had heartburn before. No [name] edema, orthopnea or PND. No more dizziness or syncope. Has been only taking lisinopril if SBP >100.    She has seen Pulmonary and home oxygen use was recommended more often.  Mostly with exertion.  They will have her have repeat CT for pul. Nodules in 11/2015.   TOday no complaints, feeling better.  No  further chest pain.  Her BP is improved but has not had meds yet.  She is taking her lisinopril daily.  She is walking in the yard for 10 min twice a day, with plans to increase to 3 times a day.  Post walk her SP02 drops to 87% she uses her home oxygen.  We discussed importance of using oxygen in those times.  And Pulmonary instructed her to call them if any congestion to prevent acute issues.  She continues to weigh daily and has only used lasix twice in last 2 weeks.   Once after taco salad, she allows herself to chest once a week.                         Past Medical History  Diagnosis Date  . C8 RADICULOPATHY 06/05/2009  . COPD 08/31/2007  . DEPRESSION 11/19/2006  . DYSLIPIDEMIA 03/12/2009  . 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 OSTEOARTHRITIS 06/05/2009  . PARESTHESIA 08/24/2007  . TOBACCO ABUSE 01/25/2010  . MI (mitral incompetence)     Past Surgical History  Procedure Laterality Date  . Appendectomy  1969  . Cholecystectomy  1989  . Exploratory laparotomy  1969  . Collapse lung  1990  . Cardiac catheterization N/A 07/05/2015    Procedure: Left Heart Cath and Coronary Angiography;  Surgeon: Kim Man, MD;  Location: [facility];  Service: Cardiovascular;  Laterality: N/A;     Current Outpatient Prescriptions  Medication Sig Dispense Refill  . albuterol (PROVENTIL HFA;VENTOLIN HFA) 108 (90 Base) MCG/ACT inhaler Inhale 2 puffs into the lungs every 4 (four) hours as needed for wheezing or shortness of breath. 1 Inhaler 5  . albuterol (PROVENTIL) (2.5 MG/3ML) 0.083% nebulizer solution Take 2.5 mg by nebulization every 6 (six) hours as needed for wheezing or shortness of breath.    . ALPRAZolam (XANAX) 0.5 MG tablet Take 0.5 mg by mouth as directed.    Marland Kitchen aspirin EC 81 MG EC tablet Take 1 tablet (81 mg total) by mouth daily.    . budesonide-formoterol (SYMBICORT) 160-4.5 MCG/ACT inhaler Inhale 2 puffs into the lungs 2 (two) times daily. 1 Inhaler 5  . carvedilol (COREG) 3.125 MG tablet Take 1 tablet (3.125 mg total) by mouth 2 (two) times daily with a meal. 60 tablet 5  . Cholecalciferol (VITAMIN D3) 1000 UNITS CAPS Take 1,000 Units by mouth 2 (two) times daily.   0  . HYDROcodone-acetaminophen (NORCO) 7.5-325 MG tablet Take 1 tablet by mouth every 4 (four) hours as needed for moderate pain.    Marland Kitchen lisinopril (PRINIVIL,ZESTRIL) 2.5 MG tablet Take 1 tablet (2.5 mg total) by mouth daily. 90 tablet 2  . nitroGLYCERIN (NITROSTAT) 0.4 MG SL tablet Place 1 tablet (0.4 mg total) under the tongue every 5 (five)  minutes as needed for chest pain. 25 tablet 3  . nystatin (MYCOSTATIN) 100000 UNIT/ML suspension Take 5 mLs by mouth 4 (four) times daily as needed (thrush from Symbicort).     Marland Kitchen omeprazole (PRILOSEC) 20 MG capsule Take 1 capsule (20 mg total) by mouth daily. 30 capsule 0  . PAXIL CR 25 MG 24 hr tablet Take 1 tablet by mouth  every morning 90 tablet 1  . pravastatin (PRAVACHOL) 20 MG tablet Take 20 mg by mouth every evening.    . tiotropium (SPIRIVA) 18 MCG inhalation capsule Place 1 capsule (18 mcg total) into inhaler and inhale daily with supper. 30 capsule 5   No current facility-administered medications  for this visit.    Allergies:   Penicillins    Social History:  The patient  reports that she quit smoking about 19 months ago. Her smoking use included Cigarettes. She has a 45 pack-year smoking history. She has never used smokeless tobacco. She reports that she does not drink alcohol or use illicit drugs.   Family History:  The patient's family history includes Alcohol abuse in her other; Arthritis in her other; Cancer in her mother and other; Diabetes in her other and sister; Heart attack in her maternal uncle, mother, other, and other; Heart disease in her other; Hypertension in her brother and sister; Kidney disease in her other; Melanoma in her other; Multiple sclerosis in her brother and other; Rheum arthritis in her sister; Stroke in her maternal grandmother and other; Thyroid disease in her sister. There is no history of Colon cancer.    ROS:  General:no colds or fevers, no weight changes Skin:no rashes or ulcers HEENT:no blurred vision, no congestion CV:see HPI PUL:see HPI GI:no diarrhea constipation or melena, no indigestion GU:no hematuria, no dysuria MS:no joint pain, no claudication Neuro:no syncope, no lightheadedness Endo:no diabetes, no thyroid disease  Wt Readings from Last 3 Encounters:  08/13/15 124 lb 1.9 oz (56.3 kg)  08/10/15 127 lb (57.607 kg)  08/03/15 125  lb 12.8 oz (57.063 kg)     PHYSICAL EXAM: VS:  BP 122/74 mmHg  Pulse 80  Ht 5\' 3"  (1.6 m)  Wt 124 lb 1.9 oz (56.3 kg)  BMI 21.99 kg/m2  SpO2 96% , BMI Body mass index is 21.99 kg/(m^2). General:Pleasant affect, NAD Skin:Warm and dry, brisk capillary refill HEENT:normocephalic, sclera clear, mucus membranes moist Neck:supple, no JVD, no bruits  Heart:S1S2 RRR without murmur, gallup, rub or click Lungs:diminished in bases without rales, rhonchi, or wheezes [id number], non tender, + BS, do not palpate liver spleen or masses Ext:no lower ext edema, 2+ pedal pulses, 2+ radial pulses Neuro:alert and oriented X 3, MAE, follows commands, + facial symmetry    EKG:  EKG is NOT ordered today.   Recent Labs: 06/24/2015: TSH 0.489 07/07/2015: B Natriuretic Peptide 330.4* 07/08/2015: ALT 29 07/19/2015: Magnesium 2.0 08/02/2015: BUN 20; Creatinine, Ser 0.89; Hemoglobin 12.6; Platelets 374; Potassium 3.9; Sodium 139    Lipid Panel    Component Value Date/Time   CHOL 216* 07/07/2015 0420   TRIG 211* 07/07/2015 0420   HDL 79 07/07/2015 0420   CHOLHDL 2.7 07/07/2015 0420   VLDL 42* 07/07/2015 0420   LDLCALC 95 07/07/2015 0420   LDLDIRECT 155.4 12/29/2012 1209       Other studies Reviewed: Additional studies/ records that were reviewed today include: previous notes and labs done in ER. .   ASSESSMENT AND PLAN:  Chest pain: now resolved. She has a Rx for SL NTG if it recurrs. We reviewed how to use the NTG but chest pain in ER was indigestion.  Labile BPs: She is taking coreg 3.25mg  BID everyday. She takes lisinopril 2.5mg  if SBP >100. This has been working well. She has been taking the lisinopril daily.  She finds if she takes meds at 11 AM she does better   Acute combined systolic and diastolic [id number] to weigh daily and monitor for s/s CHF. Will take lasix if any s/s of CHF which we went over in detail or weight gain.  Takotsubo's CM/NICM: LVEF 15 to 20% at cath on Echo next day  was 25-30%.. Currently only on BB. ACEi and lasix was discontinued due to hypotension  and dizziness. Now taking ACE if SBP >100 and will take lasix if she has s/s CHF. Will need repeat LV function eval in 2-3 months she will see Dr. Angelena [name] prior to that time and he will order.  COPD: closely followed by pulmonary home oxygen prn.   Bradycardia: most likely related to PACs and pause. She was reassured.  Non obst CAD at cath, continue statin and ASA.    Current medicines are reviewed with the patient today.  The patient Has no concerns regarding medicines.  The following changes have been made:  See above Labs/ tests ordered today include:see above  Disposition:   FU:  see above  Signed, Isaiah Serge, NP  08/13/2015 9:55 AM    [facility] [address] [place], [place] [place], [place] Phone: [phone]; Fax: [phone]

## 2015-08-13 ENCOUNTER — Ambulatory Visit (INDEPENDENT_AMBULATORY_CARE_PROVIDER_SITE_OTHER): Payer: 59 | Admitting: Cardiology

## 2015-08-13 ENCOUNTER — Encounter: Payer: Self-pay | Admitting: Cardiology

## 2015-08-13 VITALS — BP 122/74 | HR 80 | Ht 63.0 in | Wt 124.1 lb

## 2015-08-13 DIAGNOSIS — I2510 Atherosclerotic heart disease of native coronary artery without angina pectoris: Secondary | ICD-10-CM | POA: Diagnosis not present

## 2015-08-13 DIAGNOSIS — I491 Atrial premature depolarization: Secondary | ICD-10-CM | POA: Diagnosis not present

## 2015-08-13 DIAGNOSIS — J449 Chronic obstructive pulmonary disease, unspecified: Secondary | ICD-10-CM | POA: Diagnosis not present

## 2015-08-13 DIAGNOSIS — I5181 Takotsubo syndrome: Secondary | ICD-10-CM

## 2015-08-13 MED ORDER — OMEPRAZOLE 20 MG PO CPDR: 20.0000 mg | DELAYED_RELEASE_CAPSULE | Freq: Every day | ORAL | Status: DC

## 2015-08-13 MED ORDER — VITAMIN D3 25 MCG (1000 UT) PO CAPS: 1000.0000 [IU] | ORAL_CAPSULE | Freq: Two times a day (BID) | ORAL | Status: DC

## 2015-08-13 NOTE — Patient Instructions (Signed)
Medication Instructions:  Same-no changes  Labwork: None  Testing/Procedures: None  Follow-Up: Your physician recommends that you schedule a follow-up appointment in: With Dr Angelena Form as already planned    Any Other Special Instructions Will Be Listed Below (If Applicable). Continue to monitor your weight daily.     If you need a refill on your cardiac medications before your next appointment, please call your pharmacy.

## 2015-08-15 ENCOUNTER — Ambulatory Visit (INDEPENDENT_AMBULATORY_CARE_PROVIDER_SITE_OTHER): Payer: 59 | Admitting: Internal Medicine

## 2015-08-15 ENCOUNTER — Encounter: Payer: Self-pay | Admitting: Internal Medicine

## 2015-08-15 VITALS — BP 136/90 | HR 74 | Temp 98.5°F | Resp 20 | Ht 63.0 in | Wt 127.0 lb

## 2015-08-15 DIAGNOSIS — I5041 Acute combined systolic (congestive) and diastolic (congestive) heart failure: Secondary | ICD-10-CM | POA: Diagnosis not present

## 2015-08-15 DIAGNOSIS — J438 Other emphysema: Secondary | ICD-10-CM | POA: Diagnosis not present

## 2015-08-15 NOTE — Progress Notes (Signed)
Subjective:    Patient ID: Kim [name], female    DOB: [birth year], 60 y.o.   MRN: [id number]  HPI   60 year old patient who has a history of nonischemic current myopathy-  she has been followed closely by cardiology and pulmonary .  Today she has concerns about a fullness in the left upper quadrant  .  Her cardiopulmonary status has been stable  Past Medical History  Diagnosis Date  . C8 RADICULOPATHY 06/05/2009  . COPD 08/31/2007  . DEPRESSION 11/19/2006  . DYSLIPIDEMIA 03/12/2009  . 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 OSTEOARTHRITIS 06/05/2009  . PARESTHESIA 08/24/2007  . TOBACCO ABUSE 01/25/2010  . MI (mitral incompetence)      Social History   Social History  . Marital Status: Married    Spouse Name: N/A  . Number of Children: 2  . Years of Education: N/A   Occupational History  . Patient care aide-sits with elderly lady    Social History Main Topics  . Smoking status: Former Smoker -- 1.50 packs/day for 30 years    Types: Cigarettes    Quit date: 12/21/2013  . Smokeless tobacco: Never Used  . Alcohol Use: No  . Drug Use: No  . Sexual Activity: Not on file   Other Topics Concern  . Not on file   Social History Narrative    Past Surgical History  Procedure Laterality Date  . Appendectomy  1969  . Cholecystectomy  1989  . Exploratory laparotomy  1969  . Collapse lung  1990  . Cardiac catheterization N/A 07/05/2015    Procedure: Left Heart Cath and Coronary Angiography;  Surgeon: Leonie Man, MD;  Location: [facility];  Service: Cardiovascular;  Laterality: N/A;    Family History  Problem Relation Age of Onset  . Cancer Mother     lung  . Heart attack Mother   . Hypertension Sister   . Multiple sclerosis Brother   . Multiple sclerosis Other   . Alcohol abuse Other   . Arthritis Other   . Diabetes Other   . Kidney disease Other   . Cancer Other     lung,ovarian,skin, uterine  . Stroke Other   . Heart disease Other   . Melanoma Other   .  Diabetes Sister   . Rheum arthritis Sister   . Thyroid disease Sister   . Colon cancer Neg Hx   . Heart attack Maternal Uncle   . Heart attack Other     NEICE  . Heart attack Other     NEPHEW  . Hypertension Brother   . Stroke Maternal Grandmother     Allergies  Allergen Reactions  . Penicillins Hives    Has patient had a PCN reaction causing immediate rash, facial/tongue/throat swelling, SOB or lightheadedness with hypotension: Yes Has patient had a PCN reaction causing severe rash involving mucus membranes or skin necrosis: No Has patient had a PCN reaction that required hospitalization No Has patient had a PCN reaction occurring within the last 10 years: No If all of the above answers are "NO", then may proceed with Cephalosporin use.     Current Outpatient Prescriptions on File Prior to Visit  Medication Sig Dispense Refill  . albuterol (PROVENTIL HFA;VENTOLIN HFA) 108 (90 Base) MCG/ACT inhaler Inhale 2 puffs into the lungs every 4 (four) hours as needed for wheezing or shortness of breath. 1 Inhaler 5  . albuterol (PROVENTIL) (2.5 MG/3ML) 0.083% nebulizer solution Take 2.5 mg by nebulization every 6 (six) hours  as needed for wheezing or shortness of breath.    . ALPRAZolam (XANAX) 0.5 MG tablet Take 0.5 mg by mouth as directed.    Marland Kitchen aspirin EC 81 MG EC tablet Take 1 tablet (81 mg total) by mouth daily.    . budesonide-formoterol (SYMBICORT) 160-4.5 MCG/ACT inhaler Inhale 2 puffs into the lungs 2 (two) times daily. 1 Inhaler 5  . carvedilol (COREG) 3.125 MG tablet Take 1 tablet (3.125 mg total) by mouth 2 (two) times daily with a meal. 60 tablet 5  . Cholecalciferol (VITAMIN D3) 1000 units CAPS Take 1 capsule (1,000 Units total) by mouth 2 (two) times daily. 60 capsule 6  . HYDROcodone-acetaminophen (NORCO) 7.5-325 MG tablet Take 1 tablet by mouth every 4 (four) hours as needed for moderate pain.    Marland Kitchen lisinopril (PRINIVIL,ZESTRIL) 2.5 MG tablet Take 1 tablet (2.5 mg total) by  mouth daily. 90 tablet 2  . nitroGLYCERIN (NITROSTAT) 0.4 MG SL tablet Place 1 tablet (0.4 mg total) under the tongue every 5 (five) minutes as needed for chest pain. 25 tablet 3  . nystatin (MYCOSTATIN) 100000 UNIT/ML suspension Take 5 mLs by mouth 4 (four) times daily as needed (thrush from Symbicort).     Marland Kitchen omeprazole (PRILOSEC) 20 MG capsule Take 1 capsule (20 mg total) by mouth daily. 30 capsule 6  . PAXIL CR 25 MG 24 hr tablet Take 1 tablet by mouth  every morning 90 tablet 1  . pravastatin (PRAVACHOL) 20 MG tablet Take 20 mg by mouth every evening.    . tiotropium (SPIRIVA) 18 MCG inhalation capsule Place 1 capsule (18 mcg total) into inhaler and inhale daily with supper. 30 capsule 5   No current facility-administered medications on file prior to visit.    BP 136/90 mmHg  Pulse 74  Temp(Src) 98.5 F (36.9 C) (Oral)  Resp 20  Ht 5\' 3"  (1.6 m)  Wt 127 lb (57.607 kg)  BMI 22.50 kg/m2  SpO2 96%     Review of Systems  Constitutional: Negative.   HENT: Negative for congestion, dental problem, hearing loss, rhinorrhea, sinus pressure, sore throat and tinnitus.   Eyes: Negative for pain, discharge and visual disturbance.  Respiratory: Negative for cough and shortness of breath.   Cardiovascular: Negative for chest pain, palpitations and leg swelling.  Gastrointestinal: Positive for abdominal distention. Negative for nausea, vomiting, abdominal pain, diarrhea, constipation and blood in stool.  Genitourinary: Negative for dysuria, urgency, frequency, hematuria, flank pain, vaginal bleeding, vaginal discharge, difficulty urinating, vaginal pain and pelvic pain.  Musculoskeletal: Negative for joint swelling, arthralgias and gait problem.  Skin: Negative for rash.  Neurological: Negative for dizziness, syncope, speech difficulty, weakness, numbness and headaches.  Hematological: Negative for adenopathy.  Psychiatric/Behavioral: Negative for behavioral problems, dysphoric mood and  agitation. The patient is not nervous/anxious.        Objective:   Physical Exam  Constitutional: She appears well-nourished. No distress.  Abdominal: Soft. Bowel sounds are normal.  Right upper quadrant surgical scar Some mild fullness in the left upper quadrant No mass effect          Assessment & Plan:   , nonischemic cardiomyopathy , COPD  , fullness, left upper quadrant.  Benign exam.  Patient reassured

## 2015-08-15 NOTE — Progress Notes (Signed)
Pre visit review using our clinic review tool, if applicable. No additional management support is needed unless otherwise documented below in the visit note. 

## 2015-08-15 NOTE — Patient Instructions (Signed)
Call or return to clinic prn if these symptoms worsen or fail to improve as anticipated.

## 2015-08-31 ENCOUNTER — Other Ambulatory Visit: Payer: Self-pay | Admitting: Internal Medicine

## 2015-09-11 ENCOUNTER — Telehealth: Payer: Self-pay | Admitting: Internal Medicine

## 2015-09-11 ENCOUNTER — Encounter: Payer: Self-pay | Admitting: Cardiovascular Disease

## 2015-09-11 ENCOUNTER — Ambulatory Visit (INDEPENDENT_AMBULATORY_CARE_PROVIDER_SITE_OTHER): Payer: 59 | Admitting: Cardiovascular Disease

## 2015-09-11 VITALS — BP 127/82 | HR 87 | Ht 63.0 in | Wt 129.8 lb

## 2015-09-11 DIAGNOSIS — I5181 Takotsubo syndrome: Secondary | ICD-10-CM

## 2015-09-11 DIAGNOSIS — I428 Other cardiomyopathies: Secondary | ICD-10-CM

## 2015-09-11 DIAGNOSIS — J449 Chronic obstructive pulmonary disease, unspecified: Secondary | ICD-10-CM | POA: Diagnosis not present

## 2015-09-11 DIAGNOSIS — I2510 Atherosclerotic heart disease of native coronary artery without angina pectoris: Secondary | ICD-10-CM

## 2015-09-11 DIAGNOSIS — E785 Hyperlipidemia, unspecified: Secondary | ICD-10-CM

## 2015-09-11 DIAGNOSIS — F17201 Nicotine dependence, unspecified, in remission: Secondary | ICD-10-CM | POA: Diagnosis not present

## 2015-09-11 DIAGNOSIS — I429 Cardiomyopathy, unspecified: Secondary | ICD-10-CM

## 2015-09-11 MED ORDER — LISINOPRIL 2.5 MG PO TABS: 2.5000 mg | ORAL_TABLET | Freq: Two times a day (BID) | ORAL | Status: DC

## 2015-09-11 NOTE — Progress Notes (Signed)
Chief Complaint  Patient presents with  . Shortness of Breath     History of Present Illness: 60 yo female with history of COPD, HLD, tobacco abuse here today for cardiac follow up. I saw her as a new patient in April 2016. She was hospitalized in [place] December 2015 with COPD exacerbation. Troponin was elevated at 2. Cardiac catheterization showed no obstructive CAD per pt. She was felt to have a COPD exacerbation and she improved with treatment of her COPD. She quit smoking in September 2015. Admitted to [facility] March 2017 with respiratory distress, CHF. Cardiac cath March 2017 with minimal CAD, severe LV systolic dysfunction ([id number]). Her presentation was felt to be due to CHF. 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  She was diuresed with clinical improvement. She has been seen several times in our office since then with dizziness and hypotension. Lisinopril dosage was decreased and Lasix was changed to every other day. She had been limiting herself to one quart of fluid per day and the fluid restriction was lessened. She is followed in the pulmonary office by Dr. Lamonte Sakai and supplemental oxygen is now recommended with walking/exertion. Event monitor April 2017 with sinus, PACs, PVCs.   She is here today for follow up. She is feeling well. She has minimal dyspnea. No chest pain. No LE edema. Weight is stable. Rarely using supplemental O2. No dizziness, near syncope or syncope.   Primary Care Physician: [name], MD   Past Medical History  Diagnosis Date  . C8 RADICULOPATHY 06/05/2009  . COPD 08/31/2007  . DEPRESSION 11/19/2006  . DYSLIPIDEMIA 03/12/2009  . 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 OSTEOARTHRITIS 06/05/2009  . PARESTHESIA 08/24/2007  . TOBACCO ABUSE 01/25/2010  . MI (mitral incompetence)     Past Surgical History  Procedure Laterality Date  . Appendectomy  1969  . Cholecystectomy  1989  . Exploratory laparotomy  1969  .  Collapse lung  1990  . Cardiac catheterization N/A 07/05/2015    Procedure: Left Heart Cath and Coronary Angiography;  Surgeon: Leonie Man, MD;  Location: [place] [facility];  Service: Cardiovascular;  Laterality: N/A;    Current Outpatient Prescriptions  Medication Sig Dispense Refill  . albuterol (PROVENTIL HFA;VENTOLIN HFA) 108 (90 Base) MCG/ACT inhaler Inhale 2 puffs into the lungs every 4 (four) hours as needed for wheezing or shortness of breath. 1 Inhaler 5  . albuterol (PROVENTIL) (2.5 MG/3ML) 0.083% nebulizer solution Take 2.5 mg by nebulization every 6 (six) hours as needed for wheezing or shortness of breath.    . ALPRAZolam (XANAX) 0.5 MG tablet TAKE 1/2 TO 1 TABLET BY MOUTH 3 TIMES A DAY 90 tablet 2  . aspirin EC 81 MG EC tablet Take 1 tablet (81 mg total) by mouth daily.    . budesonide-formoterol (SYMBICORT) 160-4.5 MCG/ACT inhaler Inhale 2 puffs into the lungs 2 (two) times daily. 1 Inhaler 5  . carvedilol (COREG) 3.125 MG tablet Take 1 tablet (3.125 mg total) by mouth 2 (two) times daily with a meal. 60 tablet 5  . Cholecalciferol (VITAMIN D3) 1000 units CAPS Take 1 capsule (1,000 Units total) by mouth 2 (two) times daily. 60 capsule 6  . furosemide (LASIX) 40 MG tablet Take 40 mg by mouth every other day.  0  . HYDROcodone-acetaminophen (NORCO) 7.5-325 MG tablet Take 1 tablet by mouth every 4 (four) hours as needed for moderate pain.    Marland Kitchen lisinopril (PRINIVIL,ZESTRIL) 2.5 MG tablet Take 1 tablet (2.5 mg total)  by mouth 2 (two) times daily. 60 tablet 11  . nitroGLYCERIN (NITROSTAT) 0.4 MG SL tablet Place 1 tablet (0.4 mg total) under the tongue every 5 (five) minutes as needed for chest pain. 25 tablet 3  . nystatin (MYCOSTATIN) 100000 UNIT/ML suspension Take 5 mLs by mouth 4 (four) times daily as needed (thrush from Symbicort).     Marland Kitchen omeprazole (PRILOSEC) 20 MG capsule Take 1 capsule (20 mg total) by mouth daily. 30 capsule 6  . PAXIL CR 25 MG 24 hr tablet Take 1 tablet by  mouth  every morning 90 tablet 1  . pravastatin (PRAVACHOL) 20 MG tablet Take 20 mg by mouth every evening.    . tiotropium (SPIRIVA) 18 MCG inhalation capsule Place 1 capsule (18 mcg total) into inhaler and inhale daily with supper. 30 capsule 5   No current facility-administered medications for this visit.    Allergies  Allergen Reactions  . Penicillins Hives    Has patient had a PCN reaction causing immediate rash, facial/tongue/throat swelling, SOB or lightheadedness with hypotension: Yes Has patient had a PCN reaction causing severe rash involving mucus membranes or skin necrosis: No Has patient had a PCN reaction that required hospitalization No Has patient had a PCN reaction occurring within the last 10 years: No If all of the above answers are "NO", then may proceed with Cephalosporin use.     Social History   Social History  . Marital Status: Married    Spouse Name: N/A  . Number of Children: 2  . Years of Education: N/A   Occupational History  . Patient care aide-sits with elderly lady    Social History Main Topics  . Smoking status: Former Smoker -- 1.50 packs/day for 30 years    Types: Cigarettes    Quit date: 12/21/2013  . Smokeless tobacco: Never Used  . Alcohol Use: No  . Drug Use: No  . Sexual Activity: Not on file   Other Topics Concern  . Not on file   Social History Narrative    Family History  Problem Relation Age of Onset  . Cancer Mother     lung  . Heart attack Mother   . Hypertension Sister   . Multiple sclerosis Brother   . Multiple sclerosis Other   . Alcohol abuse Other   . Arthritis Other   . Diabetes Other   . Kidney disease Other   . Cancer Other     lung,ovarian,skin, uterine  . Stroke Other   . Heart disease Other   . Melanoma Other   . Diabetes Sister   . Rheum arthritis Sister   . Thyroid disease Sister   . Colon cancer Neg Hx   . Heart attack Maternal Uncle   . Heart attack Other     NEICE  . Heart attack Other      NEPHEW  . Hypertension Brother   . Stroke Maternal Grandmother     Review of Systems:  As stated in the HPI and otherwise negative.   BP 127/82 mmHg  Pulse 87  Ht 5\' 3"  (1.6 m)  Wt 129 lb 12.8 oz (58.877 kg)  BMI 23.00 kg/m2  Physical Examination: General: Well developed, well nourished, NAD HEENT: OP clear, mucus membranes moist SKIN: warm, dry. No rashes. Neuro: No focal deficits Musculoskeletal: Muscle strength 5/5 all ext Psychiatric: Mood and affect normal Neck: No JVD, no carotid bruits, no thyromegaly, no lymphadenopathy. Lungs:Clear bilaterally, no wheezes, rhonci, crackles Cardiovascular: Regular rate and rhythm. No  murmurs, gallops or rubs. Abdomen:Soft. Bowel sounds present. Non-tender.  Extremities: No lower extremity edema. Pulses are 2 + in the bilateral DP/PT.  Echo 07/06/15: Left ventricle: The cavity size was normal. Systolic function was  severely reduced. The estimated ejection fraction was in the  range of 25% to 30%. Wall motion was normal; there were no  regional wall motion abnormalities. Doppler parameters are  consistent with abnormal left ventricular relaxation (grade 1  diastolic dysfunction). There was no evidence of elevated  ventricular filling pressure by Doppler parameters. - Aortic valve: There was moderate regurgitation. - Aortic root: The aortic root was normal in size. - Left atrium: The atrium was normal in size. - Right ventricle: The cavity size was normal. Wall thickness was  normal. Systolic function was normal. - Right atrium: The atrium was normal in size. - Tricuspid valve: There was trivial regurgitation. - Pulmonary arteries: Systolic pressure was within the normal  range. - Pericardium, extracardiac: A trivial pericardial effusion was  identified posterior to the heart. Features were not consistent  with tamponade physiology.  Impressions:  - There is akinesis of all of the apical segments, hypokinesis of   the mid segments and normal motion of the basal segments  consistent with stress induced cardiomyopathy (Tako-tsubo).  LVEF is severely decreased estimated at 25-30%.  EKG:  EKG is not ordered today. The ekg ordered today demonstrates   Recent Labs: 06/24/2015: TSH 0.489 07/07/2015: B Natriuretic Peptide 330.4* 07/08/2015: ALT 29 07/19/2015: Magnesium 2.0 08/02/2015: BUN 20; Creatinine, Ser 0.89; Hemoglobin 12.6; Platelets 374; Potassium 3.9; Sodium 139   Lipid Panel    Component Value Date/Time   CHOL 216* 07/07/2015 0420   TRIG 211* 07/07/2015 0420   HDL 79 07/07/2015 0420   CHOLHDL 2.7 07/07/2015 0420   VLDL 42* 07/07/2015 0420   LDLCALC 95 07/07/2015 0420   LDLDIRECT 155.4 12/29/2012 1209     Wt Readings from Last 3 Encounters:  09/11/15 129 lb 12.8 oz (58.877 kg)  08/15/15 127 lb (57.607 kg)  08/13/15 124 lb 1.9 oz (56.3 kg)     Other studies Reviewed: Additional studies/ records that were reviewed today include: Records from [facility]. Review of the above records demonstrates:   Assessment and Plan:   1. CAD: She has minimal CAD by cath March 2017. Continue ASA, beta blocker, statin.   2. Tobacco abuse, in remission: She does not wish to start back smoking  3. Hyperlipidemia: Continue statin.   4. Chronic systolic CHF/Non-ischemic cardiomyopathy: She is tolerating Coreg and Lisinopril. Will increase Lisinopril to 2.5 mg po BID. She is using Lasix as needed based on her weight. Will repeat echo now.  5. COPD: closely followed by pulmonary. She is now on home oxygen prn.   Current medicines are reviewed at length with the patient today.  The patient does not have concerns regarding medicines.  The following changes have been made:  no change  Labs/ tests ordered today include:   Orders Placed This Encounter  Procedures  . ECHOCARDIOGRAM COMPLETE    Disposition:   FU with me in 6  months  Signed, Lauree Chandler, MD 09/11/2015 12:01 PM      [facility] [address], [place], [place]  [address] Phone: [phone]; Fax: [phone]

## 2015-09-11 NOTE — Telephone Encounter (Signed)
° ° °  Pt request refill of the following: ° °HYDROcodone-acetaminophen (NORCO) 7.5-325 MG tablet ° ° °Phamacy: °

## 2015-09-11 NOTE — Patient Instructions (Signed)
Medication Instructions:  Your physician has recommended you make the following change in your medication:  Increase lisinopril to 2.5 mg by mouth twice daily.   Labwork: none  Testing/Procedures: Your physician has requested that you have an echocardiogram. Echocardiography is a painless test that uses sound waves to create images of your heart. It provides your doctor with information about the size and shape of your heart and how well your heart's chambers and valves are working. This procedure takes approximately one hour. There are no restrictions for this procedure.  To be done at the end of June or early July.     Follow-Up: Your physician wants you to follow-up in: 6 months.  You will receive a reminder letter in the mail two months in advance. If you don't receive a letter, please call our office to schedule the follow-up appointment.   Any Other Special Instructions Will Be Listed Below (If Applicable).     If you need a refill on your cardiac medications before your next appointment, please call your pharmacy.

## 2015-09-14 MED ORDER — HYDROCODONE-ACETAMINOPHEN 7.5-325 MG PO TABS: 1.0000 | ORAL_TABLET | ORAL | Status: DC | PRN

## 2015-09-14 NOTE — Telephone Encounter (Signed)
Pt notified Rx ready for pickup. Rx printed and signed.  

## 2015-09-25 ENCOUNTER — Ambulatory Visit: Payer: 59 | Admitting: Emergency Medicine

## 2015-10-05 ENCOUNTER — Other Ambulatory Visit: Payer: Self-pay

## 2015-10-05 ENCOUNTER — Ambulatory Visit (HOSPITAL_COMMUNITY): Payer: 59 | Attending: Cardiology

## 2015-10-05 DIAGNOSIS — I2510 Atherosclerotic heart disease of native coronary artery without angina pectoris: Secondary | ICD-10-CM | POA: Diagnosis not present

## 2015-10-05 DIAGNOSIS — I429 Cardiomyopathy, unspecified: Secondary | ICD-10-CM | POA: Insufficient documentation

## 2015-10-05 DIAGNOSIS — I351 Nonrheumatic aortic (valve) insufficiency: Secondary | ICD-10-CM | POA: Diagnosis not present

## 2015-10-05 DIAGNOSIS — E785 Hyperlipidemia, unspecified: Secondary | ICD-10-CM | POA: Insufficient documentation

## 2015-10-05 DIAGNOSIS — I428 Other cardiomyopathies: Secondary | ICD-10-CM

## 2015-10-05 DIAGNOSIS — I5189 Other ill-defined heart diseases: Secondary | ICD-10-CM | POA: Insufficient documentation

## 2015-10-05 DIAGNOSIS — Z87891 Personal history of nicotine dependence: Secondary | ICD-10-CM | POA: Diagnosis not present

## 2015-10-05 DIAGNOSIS — I5181 Takotsubo syndrome: Secondary | ICD-10-CM | POA: Insufficient documentation

## 2015-10-05 LAB — ECHOCARDIOGRAM COMPLETE
AOASC: 30 cm
E decel time: 282 msec
E/e' ratio: 6.8
FS: 35 % (ref 28–44)
IVS/LV PW RATIO, ED: 1.09
LA ID, A-P, ES: 22 mm
LA diam end sys: 22 mm
LA diam index: 1.35 cm/m2
LA vol index: 26.5 mL/m2
LA vol: 43 mL
LAVOLA4C: 42 mL
LV E/e' medial: 6.8
LV E/e'average: 6.8
LV TDI E'LATERAL: 9.65
LV TDI E'MEDIAL: 8.29
LV dias vol index: 33 mL/m2
LV dias vol: 53 mL (ref 46–106)
LV e' LATERAL: 9.65 cm/s
LVOT SV: 68 mL
LVOT VTI: 24.1 cm
LVOT area: 2.84 cm2
LVOT diameter: 19 mm
LVOT peak grad rest: 7 mmHg
LVOT peak vel: 128 cm/s
LVSYSVOL: 20 mL (ref 14–42)
LVSYSVOLIN: 12 mL/m2
MV Dec: 282
MVPKAVEL: 65.6 m/s
MVPKEVEL: 65.6 m/s
P 1/2 time: 518 ms
PW: 8.6 mm — AB (ref 0.6–1.1)
RV LATERAL S' VELOCITY: 12.6 cm/s
Simpson's disk: 62
Stroke v: 33 ml

## 2015-10-10 ENCOUNTER — Telehealth: Payer: Self-pay | Admitting: Internal Medicine

## 2015-10-10 NOTE — Telephone Encounter (Signed)
Pt request refill  °HYDROcodone-acetaminophen (NORCO) 7.5-325 MG tablet °

## 2015-10-12 MED ORDER — HYDROCODONE-ACETAMINOPHEN 7.5-325 MG PO TABS: 1.0000 | ORAL_TABLET | ORAL | Status: DC | PRN

## 2015-10-12 NOTE — Telephone Encounter (Signed)
Left message on voicemail Rx ready for pickup, will be at the front desk. Rx printed and signed. 

## 2015-11-16 ENCOUNTER — Other Ambulatory Visit: Payer: Self-pay | Admitting: Internal Medicine

## 2015-11-21 ENCOUNTER — Telehealth: Payer: Self-pay | Admitting: Internal Medicine

## 2015-11-21 ENCOUNTER — Telehealth: Payer: Self-pay | Admitting: Emergency Medicine

## 2015-11-21 DIAGNOSIS — R911 Solitary pulmonary nodule: Secondary | ICD-10-CM

## 2015-11-21 NOTE — Telephone Encounter (Signed)
Pt need new Rx for Hydrocodone °

## 2015-11-21 NOTE — Telephone Encounter (Signed)
Patient states that she usually gets yearly CT done in August.  She wasn't sure if RB wanted to schedule a CT this year?    Dr. Lamonte Sakai, please advise.

## 2015-11-22 NOTE — Telephone Encounter (Signed)
Please let the patient that I ordered the CT scan. She needs to f/u w me to review the results.

## 2015-11-22 NOTE — Telephone Encounter (Signed)
Called and spoke to pt. Informed her of the recs per RB. Chest CT already placed. Pt verbalized understanding and denied any further questions or concerns at this time.

## 2015-11-30 ENCOUNTER — Ambulatory Visit (INDEPENDENT_AMBULATORY_CARE_PROVIDER_SITE_OTHER)
Admission: RE | Admit: 2015-11-30 | Discharge: 2015-11-30 | Disposition: A | Discharge status: Home / Self Care | Payer: 59 | Source: Ambulatory Visit | Attending: Emergency Medicine | Admitting: Emergency Medicine

## 2015-11-30 DIAGNOSIS — R911 Solitary pulmonary nodule: Secondary | ICD-10-CM

## 2015-12-11 ENCOUNTER — Other Ambulatory Visit: Payer: Self-pay | Admitting: Internal Medicine

## 2015-12-11 ENCOUNTER — Telehealth: Payer: Self-pay | Admitting: Internal Medicine

## 2015-12-11 NOTE — Telephone Encounter (Signed)
Pt need new Rx for Hydrocodone °

## 2015-12-12 MED ORDER — HYDROCODONE-ACETAMINOPHEN 7.5-325 MG PO TABS: 1.0000 | ORAL_TABLET | ORAL | 0 refills | Status: DC | PRN

## 2015-12-12 NOTE — Telephone Encounter (Signed)
Pt notified Rx ready for pickup. Rx printed and signed.  

## 2016-02-06 ENCOUNTER — Other Ambulatory Visit: Payer: Self-pay | Admitting: Emergency Medicine

## 2016-02-06 ENCOUNTER — Other Ambulatory Visit: Payer: Self-pay | Admitting: Cardiology

## 2016-02-11 ENCOUNTER — Encounter: Payer: Self-pay | Admitting: Emergency Medicine

## 2016-02-11 ENCOUNTER — Ambulatory Visit (INDEPENDENT_AMBULATORY_CARE_PROVIDER_SITE_OTHER): Payer: 59 | Admitting: Emergency Medicine

## 2016-02-11 DIAGNOSIS — R911 Solitary pulmonary nodule: Secondary | ICD-10-CM

## 2016-02-11 DIAGNOSIS — J438 Other emphysema: Secondary | ICD-10-CM

## 2016-02-11 NOTE — Assessment & Plan Note (Signed)
Her nodules were deemed stable on serial CT scans of the chest over 2 years. Her left lower lobe nodule may have increased in size. I believe we can transition her to the low-dose CT scan lung cancer screening program. She is willing to participate

## 2016-02-11 NOTE — Assessment & Plan Note (Signed)
Stable at this time. No exacerbations since last visit. She continues to have exertional hypoxemia which she has documented on her oximeter and which also causes her SOB. Continue Spiriva and Symbicort. I will try to get her POC concentrator and I encouraged her to use the oxygen more reliably with exertion. Flu shot today

## 2016-02-11 NOTE — Addendum Note (Signed)
Addended by: Desmond Dike C on: 02/11/2016 09:47 AM   Modules accepted: Orders

## 2016-02-11 NOTE — Patient Instructions (Addendum)
Please continue your Symbicort and Spiriva Take albuterol nebulized or 2 puffs up to every 4 hours if needed for shortness of breath.  We will work on getting a IT sales professional for you. Use your oxygen at 2L/min with exertion.  Flu shot today We will refer you to the lung cancer screening program Follow with Dr Lamonte Sakai in 6 months or sooner if you have any problems

## 2016-02-11 NOTE — Progress Notes (Signed)
Subjective:    Patient ID: Kim [name], female    DOB: Mar 16, 1956, 60 y.o.   MRN: [id number]  HPI 60 yo smoker (41 pk-yrs), hx of COPD formerly seen by Dr Elsworth Soho after a PNA in 2011.  Her spiro in 2011 > FEV1 1.46L (59%), FVC 2.68L (84%), ratio 54%. She has had wt loss 26 lbs over 6 months, increasing dyspnea. She just started chantix and has cut her cig down to 2 per day over the last several days. She has lost the ability to exert. Minimal cough. She does sometimes hear wheeze at night. No other flares except for the one for which she was hospitalized in 2011. She has used albuterol prn > just started  ROV 11/16/14 -- follow up visit for COPD, tobacco use, chronic dyspnea. She also has a history of a 4 mm left lower lobe pulmonary nodule on CT scan of the chest from 11/21/13. I last saw her about a year ago. At that time she was having progressive dyspnea. Returns feeling a bit better. Better exertional tolerance, minimal SABA use. Has gained back about 12 lbs. She has stopped smoking!! She is on spiriva and Symbicort, tolerates well. Last flare was this year, required abx and pred. She had PFT today that I have personally reviewed. FEV1 is 1L (41% pred), volumes hyperinflated, decreased DLCO.  She has daily cough, white mucous. Uses mucinex.   ROV 08/10/15 -- patient with a history of tobacco use, COPD, coronary disease with hypertension and chronic systolic heart failure. She was unfortunately admitted with Flu A respiratory failure ascribed to a CHF exacerbation and possibly a COPD exacerbation in April 2017. She states that she is doing better. Does still have some chest heaviness. She is having a bit of cough nad mucous that she believes is related to pollen. She is on spiriva and symbicort. She is on cetirizine. Uses albuterol occasionally, not every day. Minimal wheeze. Still has exertional SOB. She has O2 at home, does not use it reliably.   ROV 02/11/16 -- Patient has a history of former  tobacco use and COPD with associated hypoxemic respiratory failure. Also followed for allergic rhinitis and a 4 mm lower lobe pulmonary nodule, other scattered smaller pulmonary nodules. We repeated her CT scan of the chest in August 2017 and I have personally reviewed the results. This showed no change in her pulmonary nodules although note that the largest was measured 20mm this time and had ben 38mm. They've been stable for over 2 years and this is consistent with benign findings.  She has been well since her hospitalization in March / April for flu and an MI. No acute exacerbations since last time. She is using albuterol about 2x a day. She is interested in a POC. Currently doesn't wear her O2 very reliably. Flu shot needed today.     CAT Score 11/16/2014  Total CAT Score 27     Review of Systems  Constitutional: Negative for fever and unexpected weight change.  HENT: Negative for congestion, dental problem, ear pain, nosebleeds, postnasal drip, rhinorrhea, sinus pressure, sneezing, sore throat and trouble swallowing.   Eyes: Negative for redness and itching.  Respiratory: Positive for cough and shortness of breath. Negative for chest tightness and wheezing.   Cardiovascular: Negative for palpitations and leg swelling.  Gastrointestinal: Negative for nausea and vomiting.  Genitourinary: Negative for dysuria.  Musculoskeletal: Negative for joint swelling.  Skin: Negative for rash.  Neurological: Negative for headaches.  Hematological: Does not  bruise/bleed easily.  Psychiatric/Behavioral: Negative for dysphoric mood. The patient is not nervous/anxious.     Past Medical History:  Diagnosis Date  . C8 RADICULOPATHY 06/05/2009  . COPD 08/31/2007  . DEPRESSION 11/19/2006  . DYSLIPIDEMIA 03/12/2009  . MI (mitral incompetence)   . 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 OSTEOARTHRITIS 06/05/2009  . PARESTHESIA 08/24/2007  . TOBACCO ABUSE 01/25/2010     Family History  Problem Relation Age of Onset  .  Cancer Mother     lung  . Heart attack Mother   . Hypertension Sister   . Multiple sclerosis Brother   . Multiple sclerosis Other   . Alcohol abuse Other   . Arthritis Other   . Diabetes Other   . Kidney disease Other   . Cancer Other     lung,ovarian,skin, uterine  . Stroke Other   . Heart disease Other   . Melanoma Other   . Diabetes Sister   . Rheum arthritis Sister   . Thyroid disease Sister   . Colon cancer Neg Hx   . Heart attack Maternal Uncle   . Heart attack Other     NEICE  . Heart attack Other     NEPHEW  . Hypertension Brother   . Stroke Maternal Grandmother      Social History   Social History  . Marital status: Married    Spouse name: N/A  . Number of children: 2  . Years of education: N/A   Occupational History  . Patient care aide-sits with elderly lady    Social History Main Topics  . Smoking status: Former Smoker    Packs/day: 1.50    Years: 30.00    Types: Cigarettes    Quit date: 12/21/2013  . Smokeless tobacco: Never Used  . Alcohol use No  . Drug use: No  . Sexual activity: Not on file   Other Topics Concern  . Not on file   Social History Narrative  . No narrative on file     Allergies  Allergen Reactions  . Penicillins Hives    Has patient had a PCN reaction causing immediate rash, facial/tongue/throat swelling, SOB or lightheadedness with hypotension: Yes Has patient had a PCN reaction causing severe rash involving mucus membranes or skin necrosis: No Has patient had a PCN reaction that required hospitalization No Has patient had a PCN reaction occurring within the last 10 years: No If all of the above answers are "NO", then may proceed with Cephalosporin use.      Outpatient Medications Prior to Visit  Medication Sig Dispense Refill  . albuterol (PROVENTIL) (2.5 MG/3ML) 0.083% nebulizer solution Take 2.5 mg by nebulization every 6 (six) hours as needed for wheezing or shortness of breath.    . ALPRAZolam (XANAX) 0.5 MG  tablet TAKE 1/2 TO 1 TABLET BY MOUTH 3 TIMES A DAY 90 tablet 2  . aspirin EC 81 MG EC tablet Take 1 tablet (81 mg total) by mouth daily.    . budesonide-formoterol (SYMBICORT) 160-4.5 MCG/ACT inhaler Inhale 2 puffs into the lungs 2 (two) times daily. 1 Inhaler 5  . carvedilol (COREG) 3.125 MG tablet take 1 tablet by mouth twice a day with meals 60 tablet 5  . Cholecalciferol (VITAMIN D3) 1000 units CAPS Take 1 capsule (1,000 Units total) by mouth 2 (two) times daily. 60 capsule 6  . furosemide (LASIX) 40 MG tablet Take 40 mg by mouth every other day.  0  . HYDROcodone-acetaminophen (NORCO) 7.5-325 MG tablet Take 1 tablet  by mouth every 4 (four) hours as needed for moderate pain. 90 tablet 0  . lisinopril (PRINIVIL,ZESTRIL) 2.5 MG tablet Take 1 tablet (2.5 mg total) by mouth 2 (two) times daily. 60 tablet 11  . nitroGLYCERIN (NITROSTAT) 0.4 MG SL tablet Place 1 tablet (0.4 mg total) under the tongue every 5 (five) minutes as needed for chest pain. 25 tablet 3  . nystatin (MYCOSTATIN) 100000 UNIT/ML suspension Take 5 mLs by mouth 4 (four) times daily as needed (thrush from Symbicort).     Marland Kitchen PAXIL CR 25 MG 24 hr tablet Take 1 tablet by mouth  every morning 90 tablet 3  . pravastatin (PRAVACHOL) 20 MG tablet take 1 tablet by mouth every evening 30 tablet 5  . SPIRIVA HANDIHALER 18 MCG inhalation capsule PLACE 1 CAPSULE IN THE INHALER AND INHALE DAILY WITH SUPPER 30 capsule 2  . VENTOLIN HFA 108 (90 Base) MCG/ACT inhaler INHALE 2 PUFFS INTO THE LUNGS EVERY 4 HOURS AS NEEDED FOR WHEEZING OR SHORTNESS OF BREATH 18 Inhaler 2  . omeprazole (PRILOSEC) 20 MG capsule Take 1 capsule (20 mg total) by mouth daily. (Patient not taking: Reported on 02/11/2016) 30 capsule 6   No facility-administered medications prior to visit.          Objective:   Physical Exam Vitals:   02/11/16 0917  BP: 124/70  BP Location: Left Arm  Cuff Size: Normal  Pulse: 61  SpO2: 94%  Weight: 129 lb (58.5 kg)  Height: 5\' 3"   (1.6 m)   Gen: Pleasant, thin woman, in no distress,  normal affect  ENT: No lesions,  mouth clear,  oropharynx clear, no postnasal drip  Neck: No JVD, no TMG, no carotid bruits  Lungs: No use of accessory muscles, clear on a normal breath, B soft wheeze on a forced expiration.  Cardiovascular: RRR, heart sounds normal, no murmur or gallops, no peripheral edema  Musculoskeletal: No deformities, no cyanosis or clubbing  Neuro: alert, non focal  Skin: Warm, no lesions or rashes   11/30/15 --  COMPARISON:  11/22/2014  FINDINGS: Cardiovascular: Somewhat limited due the lack of IV contrast. Aortic calcifications are seen without aneurysmal dilatation. Moderate coronary calcifications are seen.  Mediastinum/Nodes: The thoracic inlet is within normal limits. No hilar or mediastinal adenopathy is noted. The esophagus as visualized is within normal limits.  Lungs/Pleura: Diffuse emphysematous changes are noted. No focal infiltrate or sizable effusion is seen. A tiny 2 mm nodule is noted in the right middle lobe laterally best seen on image number 86 of series 3. 3 mm right lower lobe nodule is noted on image number 109 of series 3 also stable from the prior exam. Left lower lobe nodule measuring 6 mm is noted anteriorly it is stable in appearance from the prior exam. The slight increase in size is related to the thinner imaging technique. Smaller subpleural left lower lobe nodule is noted on image number 109 of series 3 and stable from the prior exam. No new nodules are identified.  Upper Abdomen: Right adrenal gland nodule is again identified and stable. Mild fullness of the left adrenal gland is again seen without discrete nodule. The remainder of the upper abdomen is within normal limits.  Musculoskeletal: Within normal limits.  IMPRESSION: Scattered small pulmonary nodules bilaterally which are stable from the prior exam. This documents a 24 month stability and  consistent with a benign etiology. No further follow-up is necessary.  Emphysematous changes stable from the prior exam.  No new focal  abnormality is noted.       Assessment & Plan:  COPD (chronic obstructive pulmonary disease) ([place]) Stable at this time. No exacerbations since last visit. She continues to have exertional hypoxemia which she has documented on her oximeter and which also causes her SOB. Continue Spiriva and Symbicort. I will try to get her POC concentrator and I encouraged her to use the oxygen more reliably with exertion. Flu shot today  Solitary pulmonary nodule Her nodules were deemed stable on serial CT scans of the chest over 2 years. Her left lower lobe nodule may have increased in size. I believe we can transition her to the low-dose CT scan lung cancer screening program. She is willing to participate  Baltazar Apo, MD, PhD 02/11/2016, 9:43 AM [facility] [phone] or if no answer [phone]

## 2016-02-20 ENCOUNTER — Telehealth: Payer: Self-pay | Admitting: Internal Medicine

## 2016-02-20 NOTE — Telephone Encounter (Signed)
Pt need new Rx for Hydrocodone  Pt is aware of 3 business day for refills °

## 2016-02-21 MED ORDER — HYDROCODONE-ACETAMINOPHEN 7.5-325 MG PO TABS: 1.0000 | ORAL_TABLET | ORAL | 0 refills | Status: DC | PRN

## 2016-02-21 NOTE — Telephone Encounter (Signed)
Pt notified Rx ready for pickup. Rx printed and signed by Dr. Fry.  

## 2016-02-27 ENCOUNTER — Other Ambulatory Visit: Payer: Self-pay | Admitting: Emergency Medicine

## 2016-03-07 ENCOUNTER — Other Ambulatory Visit (INDEPENDENT_AMBULATORY_CARE_PROVIDER_SITE_OTHER): Payer: 59

## 2016-03-07 DIAGNOSIS — Z0000 Encounter for general adult medical examination without abnormal findings: Secondary | ICD-10-CM

## 2016-03-07 LAB — BASIC METABOLIC PANEL
BUN: 22 mg/dL (ref 6–23)
CALCIUM: 10 mg/dL (ref 8.4–10.5)
CHLORIDE: 104 meq/L (ref 96–112)
CO2: 30 meq/L (ref 19–32)
Creatinine, Ser: 0.91 mg/dL (ref 0.40–1.20)
GFR: 66.56 mL/min (ref >60.00)
GLUCOSE: 98 mg/dL (ref 70–99)
POTASSIUM: 4.2 meq/L (ref 3.5–5.1)
SODIUM: 143 meq/L (ref 135–145)

## 2016-03-07 LAB — CBC WITH DIFFERENTIAL/PLATELET
BASOS ABS: 0.1 10*3/uL (ref 0.0–0.1)
BASOS PCT: 0.9 % (ref 0.0–3.0)
EOS PCT: 2.5 % (ref 0.0–5.0)
Eosinophils Absolute: 0.2 10*3/uL (ref 0.0–0.7)
HEMATOCRIT: 40.5 % (ref 36.0–46.0)
Hemoglobin: 13.6 g/dL (ref 12.0–15.0)
LYMPHS ABS: 4.4 10*3/uL — AB (ref 0.7–4.0)
LYMPHS PCT: 44.8 % (ref 12.0–46.0)
MCHC: 33.6 g/dL (ref 30.0–36.0)
MCV: 92 fl (ref 78.0–100.0)
MONOS PCT: 9 % (ref 3.0–12.0)
Monocytes Absolute: 0.9 10*3/uL (ref 0.1–1.0)
NEUTROS ABS: 4.2 10*3/uL (ref 1.4–7.7)
NEUTROS PCT: 42.8 % — AB (ref 43.0–77.0)
PLATELETS: 275 10*3/uL (ref 150.0–400.0)
RBC: 4.4 Mil/uL (ref 3.87–5.11)
RDW: 14.6 % (ref 11.5–15.5)
WBC: 9.7 10*3/uL (ref 4.0–10.5)

## 2016-03-07 LAB — LIPID PANEL
CHOL/HDL RATIO: 5
Cholesterol: 182 mg/dL (ref 0–200)
HDL: 38.7 mg/dL — ABNORMAL LOW (ref >39.00)
LDL CALC: 113 mg/dL — AB (ref 0–99)
NonHDL: 143.51
TRIGLYCERIDES: 151 mg/dL — AB (ref 0.0–149.0)
VLDL: 30.2 mg/dL (ref 0.0–40.0)

## 2016-03-07 LAB — HEPATIC FUNCTION PANEL
ALBUMIN: 4.3 g/dL (ref 3.5–5.2)
ALK PHOS: 57 U/L (ref 39–117)
ALT: 10 U/L (ref 0–35)
AST: 12 U/L (ref 0–37)
Bilirubin, Direct: 0.1 mg/dL (ref 0.0–0.3)
TOTAL PROTEIN: 6.9 g/dL (ref 6.0–8.3)
Total Bilirubin: 0.4 mg/dL (ref 0.2–1.2)

## 2016-03-07 LAB — TSH: TSH: 1.39 u[IU]/mL (ref 0.35–4.50)

## 2016-03-07 LAB — POC URINALSYSI DIPSTICK (AUTOMATED)
BILIRUBIN UA: NEGATIVE
Blood, UA: NEGATIVE
Glucose, UA: NEGATIVE
KETONES UA: NEGATIVE
LEUKOCYTES UA: NEGATIVE
Nitrite, UA: NEGATIVE
PROTEIN UA: NEGATIVE
SPEC GRAV UA: 1.025
Urobilinogen, UA: 0.2
pH, UA: 5

## 2016-03-11 ENCOUNTER — Telehealth: Payer: Self-pay | Admitting: Emergency Medicine

## 2016-03-11 DIAGNOSIS — J438 Other emphysema: Secondary | ICD-10-CM

## 2016-03-11 NOTE — Telephone Encounter (Signed)
Spoke with pt. When she was here for her last appointment an order was to be placed for a POC. This did not get entered. Order has now been placed. Nothing further was needed.

## 2016-03-14 ENCOUNTER — Ambulatory Visit (INDEPENDENT_AMBULATORY_CARE_PROVIDER_SITE_OTHER): Payer: 59 | Admitting: Internal Medicine

## 2016-03-14 ENCOUNTER — Encounter: Payer: Self-pay | Admitting: Internal Medicine

## 2016-03-14 VITALS — BP 118/68 | HR 75 | Temp 98.3°F | Ht 62.0 in | Wt 129.2 lb

## 2016-03-14 DIAGNOSIS — Z0000 Encounter for general adult medical examination without abnormal findings: Secondary | ICD-10-CM

## 2016-03-14 DIAGNOSIS — J431 Panlobular emphysema: Secondary | ICD-10-CM

## 2016-03-14 DIAGNOSIS — E785 Hyperlipidemia, unspecified: Secondary | ICD-10-CM | POA: Diagnosis not present

## 2016-03-14 NOTE — Progress Notes (Signed)
Subjective:    Patient ID: Kim [name], female    DOB: 17-Jan-1956, 60 y.o.   MRN: [id number]  HPI 60 year old patient who is seen today for a preventive health examination. She is followed by pulmonary medicine due to COPD.  Her pulmonary status has been fairly stable on her maintenance medications. She has essential hypertension as well as dyslipidemia  She was hospitalized in the spring for a flu syndrome complicated by ACS with respiratory failure in part related to stress cardiomyopathy.  Ejection fraction at that time was 25-30%. Follow-up echocardiogram revealed normal ejection fraction of 55-60%.  Her cardiopulmonary sts has been normal  Chest CT August 2017 Colonoscopy 2014 No recent mammogram  Past Medical History:  Diagnosis Date  . C8 RADICULOPATHY 06/05/2009  . COPD 08/31/2007  . DEPRESSION 11/19/2006  . DYSLIPIDEMIA 03/12/2009  . MI (mitral incompetence)   . 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 OSTEOARTHRITIS 06/05/2009  . PARESTHESIA 08/24/2007  . TOBACCO ABUSE 01/25/2010     Social History   Social History  . Marital status: Married    Spouse name: N/A  . Number of children: 2  . Years of education: N/A   Occupational History  . Patient care aide-sits with elderly lady    Social History Main Topics  . Smoking status: Former Smoker    Packs/day: 1.50    Years: 30.00    Types: Cigarettes    Quit date: 12/21/2013  . Smokeless tobacco: Never Used  . Alcohol use No  . Drug use: No  . Sexual activity: Not on file   Other Topics Concern  . Not on file   Social History Narrative  . No narrative on file    Past Surgical History:  Procedure Laterality Date  . APPENDECTOMY  1969  . CARDIAC CATHETERIZATION N/A 07/05/2015   Procedure: Left Heart Cath and Coronary Angiography;  Surgeon: Leonie Man, MD;  Location: [facility];  Service: Cardiovascular;  Laterality: N/A;  . CHOLECYSTECTOMY  1989  . collapse lung  1990  . EXPLORATORY LAPAROTOMY  1969      Family History  Problem Relation Age of Onset  . Cancer Mother     lung  . Heart attack Mother   . Hypertension Sister   . Multiple sclerosis Brother   . Multiple sclerosis Other   . Alcohol abuse Other   . Arthritis Other   . Diabetes Other   . Kidney disease Other   . Cancer Other     lung,ovarian,skin, uterine  . Stroke Other   . Heart disease Other   . Melanoma Other   . Diabetes Sister   . Rheum arthritis Sister   . Thyroid disease Sister   . Colon cancer Neg Hx   . Heart attack Maternal Uncle   . Heart attack Other     NEICE  . Heart attack Other     NEPHEW  . Hypertension Brother   . Stroke Maternal Grandmother     Allergies  Allergen Reactions  . Penicillins Hives    Has patient had a PCN reaction causing immediate rash, facial/tongue/throat swelling, SOB or lightheadedness with hypotension: Yes Has patient had a PCN reaction causing severe rash involving mucus membranes or skin necrosis: No Has patient had a PCN reaction that required hospitalization No Has patient had a PCN reaction occurring within the last 10 years: No If all of the above answers are "NO", then may proceed with Cephalosporin use.     Current Outpatient  Prescriptions on File Prior to Visit  Medication Sig Dispense Refill  . albuterol (PROVENTIL) (2.5 MG/3ML) 0.083% nebulizer solution Take 2.5 mg by nebulization every 6 (six) hours as needed for wheezing or shortness of breath.    . ALPRAZolam (XANAX) 0.5 MG tablet TAKE 1/2 TO 1 TABLET BY MOUTH 3 TIMES A DAY 90 tablet 2  . aspirin EC 81 MG EC tablet Take 1 tablet (81 mg total) by mouth daily.    . carvedilol (COREG) 3.125 MG tablet take 1 tablet by mouth twice a day with meals 60 tablet 5  . Cholecalciferol (VITAMIN D3) 1000 units CAPS Take 1 capsule (1,000 Units total) by mouth 2 (two) times daily. 60 capsule 6  . furosemide (LASIX) 40 MG tablet Take 40 mg by mouth every other day.  0  . HYDROcodone-acetaminophen (NORCO) 7.5-325 MG  tablet Take 1 tablet by mouth every 4 (four) hours as needed for moderate pain. 90 tablet 0  . lisinopril (PRINIVIL,ZESTRIL) 2.5 MG tablet Take 1 tablet (2.5 mg total) by mouth 2 (two) times daily. 60 tablet 11  . nitroGLYCERIN (NITROSTAT) 0.4 MG SL tablet Place 1 tablet (0.4 mg total) under the tongue every 5 (five) minutes as needed for chest pain. 25 tablet 3  . nystatin (MYCOSTATIN) 100000 UNIT/ML suspension Take 5 mLs by mouth 4 (four) times daily as needed (thrush from Symbicort).     Marland Kitchen omeprazole (PRILOSEC) 20 MG capsule Take 1 capsule (20 mg total) by mouth daily. 30 capsule 6  . PAXIL CR 25 MG 24 hr tablet Take 1 tablet by mouth  every morning 90 tablet 3  . pravastatin (PRAVACHOL) 20 MG tablet take 1 tablet by mouth every evening 30 tablet 5  . SPIRIVA HANDIHALER 18 MCG inhalation capsule PLACE 1 CAPSULE IN THE INHALER AND INHALE DAILY WITH SUPPER 30 capsule 2  . SYMBICORT 160-4.5 MCG/ACT inhaler inhale 2 puffs by mouth twice a day 10.2 Inhaler 5  . VENTOLIN HFA 108 (90 Base) MCG/ACT inhaler INHALE 2 PUFFS INTO THE LUNGS EVERY 4 HOURS AS NEEDED FOR WHEEZING OR SHORTNESS OF BREATH 18 Inhaler 2   No current facility-administered medications on file prior to visit.     BP 118/68 (BP Location: Left Arm, Patient Position: Sitting, Cuff Size: Normal)   Pulse 75   Temp 98.3 F (36.8 C) (Oral)   Ht 5\' 2"  (1.575 m)   Wt 129 lb 4 oz (58.6 kg)   SpO2 98%   BMI 23.64 kg/m      Review of Systems  Constitutional: Negative.   HENT: Negative for congestion, dental problem, hearing loss, rhinorrhea, sinus pressure, sore throat and tinnitus.   Eyes: Negative for pain, discharge and visual disturbance.  Respiratory: Positive for cough, shortness of breath and wheezing.   Cardiovascular: Negative for chest pain, palpitations and leg swelling.  Gastrointestinal: Negative for abdominal distention, abdominal pain, blood in stool, constipation, diarrhea, nausea and vomiting.  Genitourinary:  Negative for difficulty urinating, dysuria, flank pain, frequency, hematuria, pelvic pain, urgency, vaginal bleeding, vaginal discharge and vaginal pain.  Musculoskeletal: Negative for arthralgias, gait problem and joint swelling.  Skin: Negative for rash.  Neurological: Negative for dizziness, syncope, speech difficulty, weakness, numbness and headaches.  Hematological: Negative for adenopathy.  Psychiatric/Behavioral: Negative for agitation, behavioral problems and dysphoric mood. The patient is not nervous/anxious.        Objective:   Physical Exam  Constitutional: She is oriented to person, place, and time. She appears well-developed and well-nourished.  HENT:  Head: Normocephalic and atraumatic.  Right Ear: External ear normal.  Left Ear: External ear normal.  Mouth/Throat: Oropharynx is clear and moist.  Eyes: Conjunctivae and EOM are normal.  Neck: Normal range of motion. Neck supple. No JVD present. No thyromegaly present.  Cardiovascular: Normal rate, regular rhythm, normal heart sounds and intact distal pulses.   No murmur heard. Pulmonary/Chest: Effort normal and breath sounds normal. She has no wheezes. She has no rales.  Abdominal: Soft. Bowel sounds are normal. She exhibits no distension and no mass. There is no tenderness. There is no rebound and no guarding.  Musculoskeletal: Normal range of motion. She exhibits no edema or tenderness.  Neurological: She is alert and oriented to person, place, and time. She has normal reflexes. No cranial nerve deficit. She exhibits normal muscle tone. Coordination normal.  Skin: Skin is warm and dry. No rash noted.  Psychiatric: She has a normal mood and affect. Her behavior is normal.          Assessment & Plan:   Preventive health examination COPD.  Pulmonary follow-up.  Continue present maintenance medications History of stress cardiomyopathy, stable Essential hypertension, well-controlled Dyslipidemia.  Continue statin  therapy  Mammogram encouraged Continue calcium and vitamin D supplements Follow-up 6 months.  Consider bone density t that time  [name]

## 2016-03-14 NOTE — Progress Notes (Signed)
Pre visit review using our clinic review tool, if applicable. No additional management support is needed unless otherwise documented below in the visit note. 

## 2016-03-14 NOTE — Patient Instructions (Addendum)
Limit your sodium (Salt) intake    It is important that you exercise regularly, at least 20 minutes 3 to 4 times per week.  If you develop chest pain or shortness of breath seek  medical attention.  Take a calcium supplement, plus [phone] units of vitamin D  Please check your blood pressure on a regular basis.  If it is consistently greater than 150/90, please make an office appointment.  Schedule your mammogram.  Pulmonary follow-up as scheduled

## 2016-03-17 ENCOUNTER — Telehealth: Payer: Self-pay | Admitting: Emergency Medicine

## 2016-03-17 NOTE — Telephone Encounter (Signed)
Patient was told to call back today if she haven't heard anything about her portable oxygen machine.

## 2016-03-17 NOTE — Telephone Encounter (Signed)
Pt is aware that [facility] confirmed the order today-that she should give them until Thursday afternoon and call if she has not heard from them then. Nothing more needed at this time.

## 2016-03-21 ENCOUNTER — Other Ambulatory Visit: Payer: 59

## 2016-03-27 ENCOUNTER — Other Ambulatory Visit: Payer: 59

## 2016-03-27 ENCOUNTER — Telehealth: Payer: Self-pay | Admitting: Internal Medicine

## 2016-03-27 NOTE — Telephone Encounter (Signed)
Pt needs new rx hydrocodone °

## 2016-03-28 ENCOUNTER — Encounter: Payer: 59 | Admitting: Internal Medicine

## 2016-03-28 MED ORDER — HYDROCODONE-ACETAMINOPHEN 7.5-325 MG PO TABS: 1.0000 | ORAL_TABLET | ORAL | 0 refills | Status: DC | PRN

## 2016-03-28 NOTE — Telephone Encounter (Signed)
Left message on voicemail  Rx ready for pickup will be at the front desk.. Rx printed and signed by Dr. Fry.  

## 2016-04-01 ENCOUNTER — Encounter: Payer: 59 | Admitting: Internal Medicine

## 2016-04-03 ENCOUNTER — Other Ambulatory Visit: Payer: Self-pay | Admitting: Cardiology

## 2016-04-03 ENCOUNTER — Other Ambulatory Visit: Payer: Self-pay | Admitting: Internal Medicine

## 2016-04-25 ENCOUNTER — Encounter: Payer: Self-pay | Admitting: *Deleted

## 2016-04-25 ENCOUNTER — Other Ambulatory Visit: Payer: Self-pay | Admitting: *Deleted

## 2016-04-25 DIAGNOSIS — Z87891 Personal history of nicotine dependence: Secondary | ICD-10-CM

## 2016-05-02 ENCOUNTER — Ambulatory Visit (INDEPENDENT_AMBULATORY_CARE_PROVIDER_SITE_OTHER): Payer: 59 | Admitting: Acute Care

## 2016-05-02 ENCOUNTER — Ambulatory Visit: Admission: RE | Admit: 2016-05-02 | Payer: 59 | Source: Ambulatory Visit

## 2016-05-02 ENCOUNTER — Encounter: Payer: Self-pay | Admitting: Acute Care

## 2016-05-02 DIAGNOSIS — Z87891 Personal history of nicotine dependence: Secondary | ICD-10-CM | POA: Diagnosis not present

## 2016-05-02 NOTE — Progress Notes (Signed)
Shared Decision Making Visit Lung Cancer Screening Program [id number])   Eligibility:  Age 61 y.o.  Pack Years Smoking History Calculation 76 pack year smoker (# packs/per year x # years smoked)  Recent History of coughing up blood  no  Unexplained weight loss? no ( >Than 15 pounds within the last 6 months )  Prior History Lung / other cancer no (Diagnosis within the last 5 years already requiring surveillance chest CT Scans).  Smoking Status: Former  Former Smokers: Years since quit < 1 year  Quit Date: 03/2016  Visit Components:  Discussion included one or more decision making aids. yes  Discussion included risk/benefits of screening. yes  Discussion included potential follow up diagnostic testing for abnormal scans. yes  Discussion included meaning and risk of over diagnosis. yes  Discussion included meaning and risk of False Positives. yes  Discussion included meaning of total radiation exposure. yes  Counseling Included:  Importance of adherence to annual lung cancer LDCT screening. yes  Impact of comorbidities on ability to participate in the program. yes  Ability and willingness to under diagnostic treatment. yes  Smoking Cessation Counseling:  Current Smokers:   Discussed importance of smoking cessation. yes  Information about tobacco cessation classes and interventions provided to patient. yes  Patient provided with "ticket" for LDCT Scan. yes  Symptomatic Patient. no  Counseling  Diagnosis Code: Tobacco Use Z72.0  Asymptomatic Patient yes  Counseling (Intermediate counseling: > three minutes counseling) [id number]  Former Smokers:   Discussed the importance of maintaining cigarette abstinence. yes  Diagnosis Code: Personal History of Nicotine Dependence. [id number]  Information about tobacco cessation classes and interventions provided to patient. Yes  Patient provided with "ticket" for LDCT Scan. yes  Written Order for Lung Cancer Screening  with LDCT placed in Epic. Yes (CT Chest Lung Cancer Screening Low Dose W/O CM) [id number] Z12.2-Screening of respiratory organs Z87.891-Personal history of nicotine dependence  I have spent 20 minutes of face to face time with [name] discussing the risks and benefits of lung cancer screening. We viewed a power point together that explained in detail the above noted topics. We paused at intervals to allow for questions to be asked and answered to ensure understanding.We discussed that the single most powerful action that she can take to decrease her risk of developing lung cancer is to quit smoking. We discussed whether or not she is ready to commit to setting a quit date. She is not ready to set a quit date.We discussed options for tools to aid in quitting smoking including nicotine replacement therapy, non-nicotine medications, support groups, Quit Smart classes, and behavior modification. We discussed that often times setting smaller, more achievable goals, such as eliminating 1 cigarette a day for a week and then 2 cigarettes a day for a week can be helpful in slowly decreasing the number of cigarettes smoked. This allows for a sense of accomplishment as well as providing a clinical benefit. I gave her the " Be Stronger Than Your Excuses" card with contact information for community resources, classes, free nicotine replacement therapy, and access to mobile apps, text messaging, and on-line smoking cessation help. I have also given her my card and contact information in the event she needs to contact me. We discussed the time and location of the scan, and that either June Leap, CMA, or I will call with the results within 24-48 hours of receiving them. I have provided her with a copy of the power point we viewed  as a  resource in the event they need reinforcement of the concepts we discussed today in the office. The patient verbalized understanding of all of  the above and had no further questions upon  leaving the office. They have my contact information in the event they have any further questions.  We discussed that the finding of CAD is a common finding with this exam.I explained this is a non-gated exam, and as such cannot determine degree or severity of disease. [name] verbalized understanding. She is currently being treated with a statin medication by her PCP, and has a known diagnosis of CAD.  Magdalen Spatz, NP 05/02/2016

## 2016-05-06 ENCOUNTER — Telehealth: Payer: Self-pay | Admitting: Internal Medicine

## 2016-05-06 MED ORDER — HYDROCODONE-ACETAMINOPHEN 7.5-325 MG PO TABS: 1.0000 | ORAL_TABLET | ORAL | 0 refills | Status: DC | PRN

## 2016-05-06 NOTE — Telephone Encounter (Signed)
Pt notified Rx ready for pickup. Rx printed and signed.  

## 2016-05-06 NOTE — Telephone Encounter (Signed)
Pt is calling in for a refill on HYDROcodone-acetaminophen (NORCO) 7.5-325 MG tablet

## 2016-06-09 ENCOUNTER — Telehealth: Payer: Self-pay | Admitting: Internal Medicine

## 2016-06-09 MED ORDER — HYDROCODONE-ACETAMINOPHEN 7.5-325 MG PO TABS: 1.0000 | ORAL_TABLET | ORAL | 0 refills | Status: DC | PRN

## 2016-06-09 NOTE — Telephone Encounter (Signed)
Pt notified Rx ready for pickup. Rx printed and signed.  

## 2016-06-09 NOTE — Telephone Encounter (Signed)
Pt need new Rx for Hydrocodone   Pt is aware of 3 business days for refills. °

## 2016-06-12 ENCOUNTER — Encounter: Payer: Self-pay | Admitting: Cardiovascular Disease

## 2016-06-19 ENCOUNTER — Ambulatory Visit (INDEPENDENT_AMBULATORY_CARE_PROVIDER_SITE_OTHER): Payer: 59 | Admitting: Cardiovascular Disease

## 2016-06-19 VITALS — BP 122/70 | HR 60 | Ht 63.0 in | Wt 123.6 lb

## 2016-06-19 DIAGNOSIS — I2510 Atherosclerotic heart disease of native coronary artery without angina pectoris: Secondary | ICD-10-CM | POA: Diagnosis not present

## 2016-06-19 DIAGNOSIS — I428 Other cardiomyopathies: Secondary | ICD-10-CM | POA: Diagnosis not present

## 2016-06-19 DIAGNOSIS — F17201 Nicotine dependence, unspecified, in remission: Secondary | ICD-10-CM

## 2016-06-19 DIAGNOSIS — E7800 Pure hypercholesterolemia, unspecified: Secondary | ICD-10-CM | POA: Diagnosis not present

## 2016-06-19 NOTE — Patient Instructions (Signed)
Medication Instructions:  Your physician recommends that you continue on your current medications as directed. Please refer to the Current Medication list given to you today.   Labwork: none  Testing/Procedures: none  Follow-Up: Your physician recommends that you schedule a follow-up appointment in: 12 months. Please call our office in 9 months to schedule this appointment    Any Other Special Instructions Will Be Listed Below (If Applicable).     If you need a refill on your cardiac medications before your next appointment, please call your pharmacy.   

## 2016-06-19 NOTE — Progress Notes (Signed)
Chief Complaint  Patient presents with  . Takotsubo syndrome    follow up  . atherosclerosis    follow up     History of Present Illness: 61 yo female with history of COPD, HLD, tobacco abuse here today for cardiac follow up. I saw her as a new patient in April 2016. She was hospitalized in [place] December 2015 with COPD exacerbation. Troponin was mildly elevated at 2. Cardiac catheterization showed no obstructive CAD. She was felt to have a COPD exacerbation and she improved with treatment of her COPD. She quit smoking in September 2015. Admitted to [facility] March 2017 with respiratory distress, CHF. Cardiac cath March 2017 with minimal CAD, severe LV systolic dysfunction (SEGB=15-17%). 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  She was diuresed with clinical improvement. She has been seen several times in our office after discharge with dizziness and hypotension. Lisinopril and Lasix were decreased. She is followed in the pulmonary office by Dr. Lamonte Sakai and supplemental oxygen is now recommended with walking/exertion. Event monitor April 2017 with sinus, PACs, PVCs. Echo June 2017 with normal LV systolic function.   She is here today for follow up. She is feeling well. She has minimal dyspnea. No chest pain. No LE edema. Weight is stable. Rarely using supplemental O2. No dizziness, near syncope or syncope.   Primary Care Physician: [name], MD   Past Medical History:  Diagnosis Date  . C8 RADICULOPATHY 06/05/2009  . COPD 08/31/2007  . DEPRESSION 11/19/2006  . DYSLIPIDEMIA 03/12/2009  . MI (mitral incompetence)   . 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 OSTEOARTHRITIS 06/05/2009  . PARESTHESIA 08/24/2007  . TOBACCO ABUSE 01/25/2010    Past Surgical History:  Procedure Laterality Date  . APPENDECTOMY  1969  . CARDIAC CATHETERIZATION N/A 07/05/2015   Procedure: Left Heart Cath and Coronary  Angiography;  Surgeon: Leonie Man, MD;  Location: [facility];  Service: Cardiovascular;  Laterality: N/A;  . CHOLECYSTECTOMY  1989  . collapse lung  1990  . EXPLORATORY LAPAROTOMY  1969    Current Outpatient Prescriptions  Medication Sig Dispense Refill  . albuterol (PROVENTIL) (2.5 MG/3ML) 0.083% nebulizer solution Take 2.5 mg by nebulization every 6 (six) hours as needed for wheezing or shortness of breath.    . ALPRAZolam (XANAX) 0.5 MG tablet take 1/2 to 1 tablet by mouth three times a day 90 tablet 2  . aspirin EC 81 MG EC tablet Take 1 tablet (81 mg total) by mouth daily.    . carvedilol (COREG) 3.125 MG tablet take 1 tablet by mouth twice a day with meals 60 tablet 5  . Cholecalciferol (VITAMIN D3) 1000 units CAPS Take 1 capsule (1,000 Units total) by mouth 2 (two) times daily. 60 capsule 6  . furosemide (LASIX) 40 MG tablet Take 40 mg by mouth every other day.  0  . HYDROcodone-acetaminophen (NORCO) 7.5-325 MG tablet Take 1 tablet by mouth every 4 (four) hours as needed for moderate pain. 90 tablet 0  . lisinopril (PRINIVIL,ZESTRIL) 2.5 MG tablet Take 1 tablet (2.5 mg total) by mouth 2 (two) times daily. 60 tablet 11  . nitroGLYCERIN (NITROSTAT) 0.4 MG SL tablet Place 1 tablet (0.4 mg total) under the tongue every 5 (five) minutes as needed for chest pain. 25 tablet 3  . nystatin (MYCOSTATIN) 100000 UNIT/ML suspension Take 5 mLs by mouth 4 (four) times daily as needed (thrush from Symbicort).     Marland Kitchen  omeprazole (PRILOSEC) 20 MG capsule take 1 capsule by mouth once daily 90 capsule 1  . PAXIL CR 25 MG 24 hr tablet Take 1 tablet by mouth  every morning 90 tablet 3  . pravastatin (PRAVACHOL) 20 MG tablet take 1 tablet by mouth every evening 30 tablet 5  . SPIRIVA HANDIHALER 18 MCG inhalation capsule PLACE 1 CAPSULE IN THE INHALER AND INHALE DAILY WITH SUPPER 30 capsule 2  . SYMBICORT 160-4.5 MCG/ACT inhaler inhale 2 puffs by mouth twice a day 10.2 Inhaler 5  . VENTOLIN HFA 108  (90 Base) MCG/ACT inhaler INHALE 2 PUFFS INTO THE LUNGS EVERY 4 HOURS AS NEEDED FOR WHEEZING OR SHORTNESS OF BREATH 18 Inhaler 2   No current facility-administered medications for this visit.     Allergies  Allergen Reactions  . Penicillins Hives    Has patient had a PCN reaction causing immediate rash, facial/tongue/throat swelling, SOB or lightheadedness with hypotension: Yes Has patient had a PCN reaction causing severe rash involving mucus membranes or skin necrosis: No Has patient had a PCN reaction that required hospitalization No Has patient had a PCN reaction occurring within the last 10 years: No If all of the above answers are "NO", then may proceed with Cephalosporin use.     Social History   Social History  . Marital status: Married    Spouse name: N/A  . Number of children: 2  . Years of education: N/A   Occupational History  . Patient care aide-sits with elderly lady    Social History Main Topics  . Smoking status: Former Smoker    Packs/day: 2.00    Years: 38.00    Types: Cigarettes    Quit date: 03/2016  . Smokeless tobacco: Never Used  . Alcohol use No  . Drug use: No  . Sexual activity: Not on file   Other Topics Concern  . Not on file   Social History Narrative  . No narrative on file    Family History  Problem Relation Age of Onset  . Cancer Mother     lung  . Heart attack Mother   . Hypertension Sister   . Multiple sclerosis Brother   . Diabetes Sister   . Rheum arthritis Sister   . Thyroid disease Sister   . Multiple sclerosis Other   . Alcohol abuse Other   . Arthritis Other   . Diabetes Other   . Kidney disease Other   . Cancer Other     lung,ovarian,skin, uterine  . Stroke Other   . Heart disease Other   . Melanoma Other   . Heart attack Maternal Uncle   . Heart attack Other     NEICE  . Heart attack Other     NEPHEW  . Hypertension Brother   . Stroke Maternal Grandmother   . Colon cancer Neg Hx     Review of Systems:   As stated in the HPI and otherwise negative.   BP 122/70   Pulse 60   Ht 5\' 3"  (1.6 m)   Wt 123 lb 9.6 oz (56.1 kg)   BMI 21.89 kg/m   Physical Examination: General: Well developed, well nourished, NAD  HEENT: OP clear, mucus membranes moist  SKIN: warm, dry. No rashes. Neuro: No focal deficits  Musculoskeletal: Muscle strength 5/5 all ext  Psychiatric: Mood and affect normal  Neck: No JVD, no carotid bruits, no thyromegaly, no lymphadenopathy.  Lungs:Clear bilaterally, no wheezes, rhonci, crackles Cardiovascular: Regular rate and rhythm. No  murmurs, gallops or rubs. Abdomen:Soft. Bowel sounds present. Non-tender.  Extremities: No lower extremity edema. Pulses are 2 + in the bilateral DP/PT.  Echo June 2017:  Left ventricle: The cavity size was normal. Wall thickness was   normal. Systolic function was normal. The estimated ejection   fraction was in the range of 55% to 60%. Wall motion was normal;   there were no regional wall motion abnormalities. Doppler   parameters are consistent with abnormal left ventricular   relaxation (grade 1 diastolic dysfunction). - Aortic valve: There was mild regurgitation.   EKG:  EKG is ordered today. The ekg ordered today demonstrates NSR, rate 60 bpm. Poor R wave progression precordial leads. Unchanged.   Recent Labs: 07/07/2015: B Natriuretic Peptide 330.4 07/19/2015: Magnesium 2.0 03/07/2016: ALT 10; BUN 22; Creatinine, Ser 0.91; Hemoglobin 13.6; Platelets 275.0; Potassium 4.2; Sodium 143; TSH 1.39   Lipid Panel    Component Value Date/Time   CHOL 182 03/07/2016 0901   TRIG 151.0 (H) 03/07/2016 0901   HDL 38.70 (L) 03/07/2016 0901   CHOLHDL 5 03/07/2016 0901   VLDL 30.2 03/07/2016 0901   LDLCALC 113 (H) 03/07/2016 0901   LDLDIRECT 155.4 12/29/2012 1209     Wt Readings from Last 3 Encounters:  06/19/16 123 lb 9.6 oz (56.1 kg)  03/14/16 129 lb 4 oz (58.6 kg)  02/11/16 129 lb (58.5 kg)     Other studies Reviewed: Additional  studies/ records that were reviewed today include: Records from [facility]. Review of the above records demonstrates:   Assessment and Plan:   1. CAD without angina: No chest pain suggestive of angina. She has minimal CAD by cath March 2017. Continue ASA, beta blocker, statin.   2. Tobacco abuse, in remission: She does not wish to start back smoking  3. Hyperlipidemia: Continue statin.   4. Chronic systolic CHF/Non-ischemic cardiomyopathy: Volume status is ok today. LV function normal by echo June 2017. She is tolerating Coreg and Lisinopril. She is using Lasix as needed based on her weight.  5. COPD: closely followed by pulmonary. She is now on home oxygen prn.   Current medicines are reviewed at length with the patient today.  The patient does not have concerns regarding medicines.  The following changes have been made:  no change  Labs/ tests ordered today include:   Orders Placed This Encounter  Procedures  . EKG 12-Lead    Disposition:   FU with me in 12  months  Signed, Lauree Chandler, MD 06/19/2016 1:41 PM    [facility] [address], [place], [place]  [address] Phone: [phone]; Fax: [phone]

## 2016-07-23 ENCOUNTER — Telehealth: Payer: Self-pay | Admitting: Internal Medicine

## 2016-07-23 NOTE — Telephone Encounter (Signed)
Pt needs new rx hydrocodone °

## 2016-07-25 MED ORDER — HYDROCODONE-ACETAMINOPHEN 7.5-325 MG PO TABS: 1.0000 | ORAL_TABLET | ORAL | 0 refills | Status: DC | PRN

## 2016-07-25 NOTE — Telephone Encounter (Signed)
Rx printed awaiting to be signed.  

## 2016-07-25 NOTE — Telephone Encounter (Signed)
Pt notified Rx ready for pickup. Rx printed and signed.  

## 2016-07-29 ENCOUNTER — Other Ambulatory Visit: Payer: Self-pay | Admitting: Internal Medicine

## 2016-08-12 ENCOUNTER — Other Ambulatory Visit: Payer: Self-pay | Admitting: Cardiovascular Disease

## 2016-08-22 ENCOUNTER — Ambulatory Visit (INDEPENDENT_AMBULATORY_CARE_PROVIDER_SITE_OTHER): Payer: 59 | Admitting: Internal Medicine

## 2016-08-22 ENCOUNTER — Encounter: Payer: Self-pay | Admitting: Internal Medicine

## 2016-08-22 VITALS — BP 110/64 | HR 68 | Temp 98.4°F | Ht 63.0 in | Wt 123.4 lb

## 2016-08-22 DIAGNOSIS — F32A Depression, unspecified: Secondary | ICD-10-CM

## 2016-08-22 DIAGNOSIS — F329 Major depressive disorder, single episode, unspecified: Secondary | ICD-10-CM

## 2016-08-22 DIAGNOSIS — E785 Hyperlipidemia, unspecified: Secondary | ICD-10-CM | POA: Diagnosis not present

## 2016-08-22 DIAGNOSIS — R5383 Other fatigue: Secondary | ICD-10-CM | POA: Diagnosis not present

## 2016-08-22 DIAGNOSIS — J42 Unspecified chronic bronchitis: Secondary | ICD-10-CM

## 2016-08-22 NOTE — Patient Instructions (Signed)
WE NOW OFFER   [facility] FAST TRACK!!!  SAME DAY Appointments for ACUTE CARE  Such as: Sprains, Injuries, cuts, abrasions, rashes, muscle pain, joint pain, back pain Colds, flu, sore throats, headache, allergies, cough, fever  Ear pain, sinus and eye infections Abdominal pain, nausea, vomiting, diarrhea, upset stomach Animal/insect bites  3 Easy Ways to Schedule: Walk-In Scheduling Call in scheduling Mychart Sign-up: [email or web address]         

## 2016-08-23 ENCOUNTER — Encounter: Payer: Self-pay | Admitting: Internal Medicine

## 2016-08-23 NOTE — Progress Notes (Signed)
Subjective:    Patient ID: Kim [name], female    DOB: 1955-07-01, 61 y.o.   MRN: [id number]  HPI  61 year old patient who is accompanied by her husband today with a 1-2 week history of fatigue. She describes considerable situational stress last year with health issues as well as the death of multiple family members and friends.  She wonders if she has depression as a delayed reaction to last years stressors. She has a history of stress cardiomyopathy but LV ejection fraction normalized in June of last year.  She has significant COPD and history of exercise induced hypoxemia She complains of fatigue and wanted to sleep excessively.  Her normal pattern is to sleep 12 hours daily. Over the past couple weeks she has had some increasing allergy issues and feels that her oxygen saturations have declined somewhat.  She has been compliant with her pulmonary maintenance medications, but oftenforgets to take other medications.  Her cardiopulmonary status appears fairly stable.  Medical regimen does include Paxil daily.  She has been prescribed furosemide that she takes once or twice per week.  \ Past Medical History:  Diagnosis Date  . C8 RADICULOPATHY 06/05/2009  . COPD 08/31/2007  . DEPRESSION 11/19/2006  . DYSLIPIDEMIA 03/12/2009  . MI (mitral incompetence)   . 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 OSTEOARTHRITIS 06/05/2009  . PARESTHESIA 08/24/2007  . TOBACCO ABUSE 01/25/2010     Social History   Social History  . Marital status: Married    Spouse name: N/A  . Number of children: 2  . Years of education: N/A   Occupational History  . Patient care aide-sits with elderly lady    Social History Main Topics  . Smoking status: Former Smoker    Packs/day: 2.00    Years: 38.00    Types: Cigarettes    Quit date: 03/2016  . Smokeless tobacco: Never Used  . Alcohol use No  . Drug use: No  . Sexual activity: Not on file   Other Topics Concern  . Not on file   Social History Narrative  . No  narrative on file    Past Surgical History:  Procedure Laterality Date  . APPENDECTOMY  1969  . CARDIAC CATHETERIZATION N/A 07/05/2015   Procedure: Left Heart Cath and Coronary Angiography;  Surgeon: Leonie Man, MD;  Location: [facility];  Service: Cardiovascular;  Laterality: N/A;  . CHOLECYSTECTOMY  1989  . collapse lung  1990  . EXPLORATORY LAPAROTOMY  1969    Family History  Problem Relation Age of Onset  . Cancer Mother        lung  . Heart attack Mother   . Hypertension Sister   . Multiple sclerosis Brother   . Diabetes Sister   . Rheum arthritis Sister   . Thyroid disease Sister   . Multiple sclerosis Other   . Alcohol abuse Other   . Arthritis Other   . Diabetes Other   . Kidney disease Other   . Cancer Other        lung,ovarian,skin, uterine  . Stroke Other   . Heart disease Other   . Melanoma Other   . Heart attack Maternal Uncle   . Heart attack Other        NEICE  . Heart attack Other        NEPHEW  . Hypertension Brother   . Stroke Maternal Grandmother   . Colon cancer Neg Hx     Allergies  Allergen Reactions  .  Penicillins Hives    Has patient had a PCN reaction causing immediate rash, facial/tongue/throat swelling, SOB or lightheadedness with hypotension: Yes Has patient had a PCN reaction causing severe rash involving mucus membranes or skin necrosis: No Has patient had a PCN reaction that required hospitalization No Has patient had a PCN reaction occurring within the last 10 years: No If all of the above answers are "NO", then may proceed with Cephalosporin use.     Current Outpatient Prescriptions on File Prior to Visit  Medication Sig Dispense Refill  . albuterol (PROVENTIL) (2.5 MG/3ML) 0.083% nebulizer solution Take 2.5 mg by nebulization every 6 (six) hours as needed for wheezing or shortness of breath.    . ALPRAZolam (XANAX) 0.5 MG tablet TAKE 1/2 TO 1 TABLET BY MOUTH 3 TIMES A DAY 90 tablet 2  . aspirin EC 81 MG EC tablet  Take 1 tablet (81 mg total) by mouth daily.    . carvedilol (COREG) 3.125 MG tablet TAKE 1 TABLET BY MOUTH 2 TIMES DAILY WITH MEALS 60 tablet 9  . Cholecalciferol (VITAMIN D3) 1000 units CAPS Take 1 capsule (1,000 Units total) by mouth 2 (two) times daily. 60 capsule 6  . furosemide (LASIX) 40 MG tablet Take 40 mg by mouth every other day.  0  . HYDROcodone-acetaminophen (NORCO) 7.5-325 MG tablet Take 1 tablet by mouth every 4 (four) hours as needed for moderate pain. 90 tablet 0  . lisinopril (PRINIVIL,ZESTRIL) 2.5 MG tablet Take 1 tablet (2.5 mg total) by mouth 2 (two) times daily. 60 tablet 11  . nitroGLYCERIN (NITROSTAT) 0.4 MG SL tablet Place 1 tablet (0.4 mg total) under the tongue every 5 (five) minutes as needed for chest pain. 25 tablet 3  . nystatin (MYCOSTATIN) 100000 UNIT/ML suspension Take 5 mLs by mouth 4 (four) times daily as needed (thrush from Symbicort).     Marland Kitchen omeprazole (PRILOSEC) 20 MG capsule take 1 capsule by mouth once daily 90 capsule 1  . PAXIL CR 25 MG 24 hr tablet Take 1 tablet by mouth  every morning 90 tablet 3  . pravastatin (PRAVACHOL) 20 MG tablet take 1 tablet by mouth every evening 30 tablet 5  . SPIRIVA HANDIHALER 18 MCG inhalation capsule PLACE 1 CAPSULE IN THE INHALER AND INHALE DAILY WITH SUPPER 30 capsule 2  . SYMBICORT 160-4.5 MCG/ACT inhaler inhale 2 puffs by mouth twice a day 10.2 Inhaler 5  . VENTOLIN HFA 108 (90 Base) MCG/ACT inhaler INHALE 2 PUFFS INTO THE LUNGS EVERY 4 HOURS AS NEEDED FOR WHEEZING OR SHORTNESS OF BREATH 18 Inhaler 2   No current facility-administered medications on file prior to visit.     BP 110/64 (BP Location: Left Arm, Patient Position: Sitting, Cuff Size: Normal)   Pulse 68   Temp 98.4 F (36.9 C) (Oral)   Ht 5\' 3"  (1.6 m)   Wt 123 lb 6.4 oz (56 kg)   SpO2 95%   BMI 21.86 kg/m     Review of Systems  Constitutional: Positive for fatigue.  HENT: Negative for congestion, dental problem, hearing loss, rhinorrhea, sinus  pressure, sore throat and tinnitus.   Eyes: Negative for pain, discharge and visual disturbance.  Respiratory: Negative for cough and shortness of breath.   Cardiovascular: Negative for chest pain, palpitations and leg swelling.  Gastrointestinal: Negative for abdominal distention, abdominal pain, blood in stool, constipation, diarrhea, nausea and vomiting.  Genitourinary: Negative for difficulty urinating, dysuria, flank pain, frequency, hematuria, pelvic pain, urgency, vaginal bleeding, vaginal discharge and  vaginal pain.  Musculoskeletal: Negative for arthralgias, gait problem and joint swelling.  Skin: Negative for rash.  Neurological: Positive for weakness. Negative for dizziness, syncope, speech difficulty, numbness and headaches.  Hematological: Negative for adenopathy.  Psychiatric/Behavioral: Negative for agitation, behavioral problems and dysphoric mood. The patient is not nervous/anxious.        Objective:   Physical Exam  Constitutional: She is oriented to person, place, and time. She appears well-developed and well-nourished.  HENT:  Head: Normocephalic.  Right Ear: External ear normal.  Left Ear: External ear normal.  Mouth/Throat: Oropharynx is clear and moist.  Eyes: Conjunctivae and EOM are normal. Pupils are equal, round, and reactive to light.  Neck: Normal range of motion. Neck supple. No thyromegaly present.  Cardiovascular: Normal rate, regular rhythm, normal heart sounds and intact distal pulses.   Pulmonary/Chest: Effort normal and breath sounds normal.  oxygen level at rest as high as 98%; after a brisk walk up and down the hall dropped to 95% Pulse rate 64 to approximately 80  Abdominal: Soft. Bowel sounds are normal. She exhibits no mass. There is no tenderness.  Musculoskeletal: Normal range of motion.  Lymphadenopathy:    She has no cervical adenopathy.  Neurological: She is alert and oriented to person, place, and time.  Skin: Skin is warm and dry. No  rash noted.  Psychiatric: She has a normal mood and affect. Her behavior is normal.          Assessment & Plan:   Fatigue.  Unclear etiology.  She has been symptomatic for only one or 2 weeks.  She is on SSRI therapy.  May be related to high pollen count.  She does describe some decreased oxygen saturations.  Will observe at this point.  Will consider a slow taper and discontinuation of beta blocker therapy if symptoms do not improve.  Blood pressure today is in a low-normal range.  We'll discontinue furosemide unless the patient develops significant edema COPD.  Compliance with her maintenance medications.  Encouraged.  Will continue present regimen.  Appears to be stable Stress cardiomyopathy.  No change in medical regimen at this time.  Will minimize diuretics.  Consider dose reduction of maintenance medications in the future  Continue alprazolam when necessary  Follow-up 4 weeks or as needed  [name]

## 2016-08-25 ENCOUNTER — Telehealth: Payer: Self-pay | Admitting: Cardiovascular Disease

## 2016-08-25 NOTE — Telephone Encounter (Signed)
New Message     Pt feeling very fatigued , her PCP thinks she should follow up with you , I set her appt with Tera Helper 08/26/16

## 2016-08-25 NOTE — Telephone Encounter (Signed)
I spoke with pt who reports 2 week history of fatigue.  She is also concerned about BP, heart rate and possible medication changes.  She will plan on keeping appt with Truitt Merle, NP tomorrow at 10:00

## 2016-08-26 ENCOUNTER — Encounter: Payer: Self-pay | Admitting: Nurse Practitioner

## 2016-08-26 ENCOUNTER — Ambulatory Visit (INDEPENDENT_AMBULATORY_CARE_PROVIDER_SITE_OTHER): Payer: 59 | Admitting: Nurse Practitioner

## 2016-08-26 ENCOUNTER — Ambulatory Visit
Admission: RE | Admit: 2016-08-26 | Discharge: 2016-08-26 | Disposition: A | Discharge status: Home / Self Care | Payer: 59 | Source: Ambulatory Visit | Attending: Nurse Practitioner | Admitting: Nurse Practitioner

## 2016-08-26 VITALS — BP 170/80 | HR 63 | Ht 63.0 in | Wt 122.4 lb

## 2016-08-26 DIAGNOSIS — I5181 Takotsubo syndrome: Secondary | ICD-10-CM

## 2016-08-26 DIAGNOSIS — R0602 Shortness of breath: Secondary | ICD-10-CM

## 2016-08-26 DIAGNOSIS — R0989 Other specified symptoms and signs involving the circulatory and respiratory systems: Secondary | ICD-10-CM

## 2016-08-26 DIAGNOSIS — E7800 Pure hypercholesterolemia, unspecified: Secondary | ICD-10-CM

## 2016-08-26 DIAGNOSIS — I428 Other cardiomyopathies: Secondary | ICD-10-CM | POA: Diagnosis not present

## 2016-08-26 NOTE — Progress Notes (Signed)
CARDIOLOGY OFFICE NOTE  Date:  08/26/2016    [name] Date of Birth: 07-30-1955 Medical Record #[id number]  PCP:  Marletta Lor, MD  Cardiologist:  [facility]    Chief Complaint  Patient presents with  . Fatigue    Work in visit - seen for Dr. Angelena Form    History of Present Illness: Kim [name] is a 61 y.o. female who presents today for a work in visit. Seen for Dr. Angelena Form.  She has a history of COPD, HLD, & tobacco abuse. Seen here as a new patient in April 2016. She was hospitalized in [place] December 2015 with COPD exacerbation. Troponin was mildly elevated at 2. Cardiac catheterization showed no obstructive CAD. She was felt to have a COPD exacerbation and she improved with treatment of her COPD. She quit smoking in September 2015. Admitted to [facility] March 2017 with respiratory distress, CHF. Cardiac cath March 2017 with minimal CAD, but severe LV systolic dysfunction (KWIO=[id number]%). 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  She was diuresed with clinical improvement. She has been seen several times in our office after discharge with dizziness and hypotension. Lisinopril and Lasix were decreased. She is followed in the pulmonary office by Dr. Lamonte Sakai and supplemental oxygen is now recommended with walking/exertion. Event monitor April 2017 with sinus, PACs, PVCs. Echo June 2017 with normal LV systolic function.   Last seen here back in March by Dr. Angelena Form and was felt to be doing ok. Minimal dyspnea and rarely using her supplemental O2.   Phone call yesterday - "I spoke with pt who reports 2 week history of fatigue.  She is also concerned about BP, heart rate and possible medication changes.  She will plan on keeping appt with Truitt Merle, NP tomorrow at 10:00"  Thus added to my schedule for today.  Comes in today. Here with her mother in law today. Lots of issues. Lots  of situational stress last fall - 5 people died within a span of just a few months - she wondered if she was depressed - he advised her reducing/stopping her cardiac medicines - she did not stop anything. She then made a visit here. Only taking Lasix prn for weight gain over 3 pounds. Typically takes about once or twice a week. Tells me that she is very fatigued - did not have any of this when here back in March - says she "felt like a million bucks then". Also with shortness of breath - remains off cigarettes but breathing is worse over the past [redacted] weeks along with the fatigue. She has not wanted to use her oxygen - that she still has. No actual chest pain but feels a "flap" opening/closing under the left breast. No cough.  Her weight is stable. BP typically better at home and was better at the PCP's office.   Past Medical History:  Diagnosis Date  . C8 RADICULOPATHY 06/05/2009  . COPD 08/31/2007  . DEPRESSION 11/19/2006  . DYSLIPIDEMIA 03/12/2009  . MI (mitral incompetence)   . 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 OSTEOARTHRITIS 06/05/2009  . PARESTHESIA 08/24/2007  . TOBACCO ABUSE 01/25/2010    Past Surgical History:  Procedure Laterality Date  . APPENDECTOMY  1969  . CARDIAC CATHETERIZATION N/A 07/05/2015   Procedure: Left Heart Cath and Coronary Angiography;  Surgeon: Leonie Man, MD;  Location: [facility];  Service: Cardiovascular;  Laterality: N/A;  . CHOLECYSTECTOMY  1989  . collapse lung  1990  . EXPLORATORY LAPAROTOMY  1969     Medications: Current Outpatient Prescriptions  Medication Sig Dispense Refill  . albuterol (PROVENTIL) (2.5 MG/3ML) 0.083% nebulizer solution Take 2.5 mg by nebulization every 6 (six) hours as needed for wheezing or shortness of breath.    . ALPRAZolam (XANAX) 0.5 MG tablet TAKE 1/2 TO 1 TABLET BY MOUTH 3 TIMES A DAY 90 tablet 2  . aspirin EC 81 MG EC tablet Take 1 tablet (81 mg total) by mouth daily.    . carvedilol (COREG) 3.125 MG tablet TAKE 1 TABLET BY  MOUTH 2 TIMES DAILY WITH MEALS 60 tablet 9  . Cholecalciferol (VITAMIN D3) 1000 units CAPS Take 1 capsule (1,000 Units total) by mouth 2 (two) times daily. 60 capsule 6  . furosemide (LASIX) 40 MG tablet Take 40 mg by mouth as needed (for weight gain of 3 lbs).   0  . HYDROcodone-acetaminophen (NORCO) 7.5-325 MG tablet Take 1 tablet by mouth every 4 (four) hours as needed for moderate pain. 90 tablet 0  . lisinopril (PRINIVIL,ZESTRIL) 2.5 MG tablet Take 1 tablet (2.5 mg total) by mouth 2 (two) times daily. 60 tablet 11  . nitroGLYCERIN (NITROSTAT) 0.4 MG SL tablet Place 1 tablet (0.4 mg total) under the tongue every 5 (five) minutes as needed for chest pain. 25 tablet 3  . PAXIL CR 25 MG 24 hr tablet Take 1 tablet by mouth  every morning 90 tablet 3  . pravastatin (PRAVACHOL) 20 MG tablet take 1 tablet by mouth every evening 30 tablet 5  . SPIRIVA HANDIHALER 18 MCG inhalation capsule PLACE 1 CAPSULE IN THE INHALER AND INHALE DAILY WITH SUPPER 30 capsule 2  . SYMBICORT 160-4.5 MCG/ACT inhaler inhale 2 puffs by mouth twice a day 10.2 Inhaler 5  . VENTOLIN HFA 108 (90 Base) MCG/ACT inhaler INHALE 2 PUFFS INTO THE LUNGS EVERY 4 HOURS AS NEEDED FOR WHEEZING OR SHORTNESS OF BREATH 18 Inhaler 2  . vitamin B-12 (CYANOCOBALAMIN) 500 MCG tablet Take 500 mcg by mouth 2 (two) times daily.     No current facility-administered medications for this visit.     Allergies: Allergies  Allergen Reactions  . Penicillins Hives    Has patient had a PCN reaction causing immediate rash, facial/tongue/throat swelling, SOB or lightheadedness with hypotension: Yes Has patient had a PCN reaction causing severe rash involving mucus membranes or skin necrosis: No Has patient had a PCN reaction that required hospitalization No Has patient had a PCN reaction occurring within the last 10 years: No If all of the above answers are "NO", then may proceed with Cephalosporin use.     Social History: The patient  reports that  she quit smoking about 2 years ago. Her smoking use included Cigarettes. She has a 76.00 pack-year smoking history. She has never used smokeless tobacco. She reports that she does not drink alcohol or use drugs.   Family History: The patient's family history includes Alcohol abuse in her other; Arthritis in her other; Cancer in her mother and other; Diabetes in her other and sister; Heart attack in her maternal uncle, mother, other, and other; Heart disease in her other; Hypertension in her brother and sister; Kidney disease in her other; Melanoma in her other; Multiple sclerosis in her brother and other; Rheum arthritis in her sister; Stroke in her maternal grandmother and other; Thyroid disease in her sister.   Review of Systems: Please see the history of present illness.  Otherwise, the review of systems is positive for none.   All other systems are reviewed and negative.   Physical Exam: VS:  BP (!) 170/80 (BP Location: Left Arm, Patient Position: Sitting, Cuff Size: Normal)   Pulse 63   Ht 5\' 3"  (1.6 m)   Wt 122 lb 6.4 oz (55.5 kg)   SpO2 96% Comment: walked 91 and gulping for breath.  BMI 21.68 kg/m  .  BMI Body mass index is 21.68 kg/m.  Wt Readings from Last 3 Encounters:  08/26/16 122 lb 6.4 oz (55.5 kg)  08/22/16 123 lb 6.4 oz (56 kg)  06/19/16 123 lb 9.6 oz (56.1 kg)   BP by me is down to 150/80  General: Pleasant. Little anxious but alert and in no acute distress.  She was visibly short of breath with walking in the office but sats remained stable.  HEENT: Normal.  Neck: Supple, no JVD, carotid bruits, or masses noted.  Cardiac: Regular rate and rhythm. No murmurs, rubs, or gallops. No edema.  Respiratory:  Lungs are clear to auscultation bilaterally with normal work of breathing.  GI: Soft and nontender.  MS: No deformity or atrophy. Gait and ROM intact.  Skin: Warm and dry. Color is normal.  Neuro:  Strength and sensation are intact and no gross focal deficits noted.    Psych: Alert, appropriate and with normal affect.   LABORATORY DATA:  EKG:  EKG is ordered today. This demonstrates NSR.   Lab Results  Component Value Date   WBC 9.7 03/07/2016   HGB 13.6 03/07/2016   HCT 40.5 03/07/2016   PLT 275.0 03/07/2016   GLUCOSE 98 03/07/2016   CHOL 182 03/07/2016   TRIG 151.0 (H) 03/07/2016   HDL 38.70 (L) 03/07/2016   LDLDIRECT 155.4 12/29/2012   LDLCALC 113 (H) 03/07/2016   ALT 10 03/07/2016   AST 12 03/07/2016   NA 143 03/07/2016   K 4.2 03/07/2016   CL 104 03/07/2016   CREATININE 0.91 03/07/2016   BUN 22 03/07/2016   CO2 30 03/07/2016   TSH 1.39 03/07/2016   INR 0.98 07/05/2015    BNP (last 3 results) No results for input(s): BNP in the last 8760 hours.  ProBNP (last 3 results) No results for input(s): PROBNP in the last 8760 hours.   Other Studies Reviewed Today:  Echo June 2017:  Left ventricle: The cavity size was normal. Wall thickness was normal. Systolic function was normal. The estimated ejection fraction was in the range of 55% to 60%. Wall motion was normal; there were no regional wall motion abnormalities. Doppler parameters are consistent with abnormal left ventricular relaxation (grade 1 diastolic dysfunction). - Aortic valve: There was mild regurgitation.    Notes Recorded by [name], MD on 08/10/2015 at 7:31 PM See event monitor. She has bradycardia at times but no heart rates below 55 bpm. She does have PACs and PVCs. Continue beta blocker. Avoid stimulants. cdm   Procedures   Left Heart Cath and Coronary Angiography 06/2015  Conclusion   1. Angiographically minimal coronary disease 2. There is severe left ventricular systolic dysfunction. 3. 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  Recommendations:  Return the CCU/TCU for post radial cath care and ongoing treatment of nonischemic  cardiomyopathy  Continue aggressive risk modification and diuresis.  Treat underlying cause for stress induced cardiomyopathy         Assessment/Plan:  1. Fatigue/shortness of breath -  will check lab today. Send for CXR. Update her echo as well. I would favor leaving her on her current medicines for now. She is at risk of recurrence for Takotsubo. Further disposition to follow.   2. CAD without angina: No chest pain suggestive of angina. She has minimal CAD by cath March 2017. Continue ASA, beta blocker, statin.   3. Tobacco abuse, in remission: Not smoking but more short of breath. Arrange CXR today. Checking BNP. May need to get back to pulmonary.   4. Hyperlipidemia: On statin therapy   5. Chronic systolic CHF/Non-ischemic cardiomyopathy: Last echo with normalization of her EF - see #1  6. COPD: closely followed by pulmonary. She has home oxygen prn. Currently not using.   Current medicines are reviewed with the patient today.  The patient does not have concerns regarding medicines other than what has been noted above.  The following changes have been made:  See above.  Labs/ tests ordered today include:    Orders Placed This Encounter  Procedures  . DG Chest 2 View  . Basic metabolic panel  . CBC  . Pro b natriuretic peptide (BNP)  . TSH  . EKG 12-Lead  . ECHOCARDIOGRAM COMPLETE     Disposition:   Further disposition to follow.   Patient is agreeable to this plan and will call if any problems develop in the interim.   SignedTruitt Merle, NP  08/26/2016 10:45 AM  [facility] [address] [place], [place]  [address] Phone: [phone] Fax: [phone]

## 2016-08-26 NOTE — Patient Instructions (Addendum)
We will be checking the following labs today - BMET, CBC, TSH, BNP  Please go to [facility] to [facility] on the first floor for a chest Xray - you may walk in.     Medication Instructions:    Continue with your current medicines.     Testing/Procedures To Be Arranged:  Echocardiogram  Follow-Up:   Will see how your tests turn out and then decide about follow up.     Other Special Instructions:   N/A    If you need a refill on your cardiac medications before your next appointment, please call your pharmacy.   Call the [place] office at [phone] if you have any questions, problems or concerns.

## 2016-08-27 ENCOUNTER — Telehealth: Payer: Self-pay | Admitting: Emergency Medicine

## 2016-08-27 LAB — BASIC METABOLIC PANEL
BUN/Creatinine Ratio: 28 (ref 12–28)
BUN: 20 mg/dL (ref 8–27)
CO2: 23 mmol/L (ref 18–29)
Calcium: 9.6 mg/dL (ref 8.7–10.3)
Chloride: 103 mmol/L (ref 96–106)
Creatinine, Ser: 0.71 mg/dL (ref 0.57–1.00)
GFR calc Af Amer: 106 mL/min/{1.73_m2} (ref >59)
GFR calc non Af Amer: 92 mL/min/{1.73_m2} (ref >59)
Glucose: 104 mg/dL — ABNORMAL HIGH (ref 65–99)
Potassium: 4.9 mmol/L (ref 3.5–5.2)
Sodium: 142 mmol/L (ref 134–144)

## 2016-08-27 LAB — CBC
Hematocrit: 42.6 % (ref 34.0–46.6)
Hemoglobin: 14 g/dL (ref 11.1–15.9)
MCH: 30.3 pg (ref 26.6–33.0)
MCHC: 32.9 g/dL (ref 31.5–35.7)
MCV: 92 fL (ref 79–97)
Platelets: 297 10*3/uL (ref 150–379)
RBC: 4.62 x10E6/uL (ref 3.77–5.28)
RDW: 14.1 % (ref 12.3–15.4)
WBC: 8.3 10*3/uL (ref 3.4–10.8)

## 2016-08-27 LAB — TSH: TSH: 0.921 u[IU]/mL (ref 0.450–4.500)

## 2016-08-27 LAB — PRO B NATRIURETIC PEPTIDE: NT-Pro BNP: 43 pg/mL (ref 0–287)

## 2016-08-27 NOTE — Telephone Encounter (Signed)
Spoke with pt, aware of recs.  Scheduled to see AD tomorrow at 10:30.  Nothing further needed.

## 2016-08-27 NOTE — Telephone Encounter (Signed)
Agree that she needs to be seen - please try to arrange w <MD or APP asap. Thanks.

## 2016-08-27 NOTE — Telephone Encounter (Signed)
Pt c/o excessive fatigue and tiredness.  Pt having increased SOB. Symptoms present about 2 weeks. Pt thought when it started that it was pollen related but now she is not too sure.  Pt states that Cardiology checked out and its not heart related -- pt has Echo scheduled to complete assessment.  Requesting appt with RB or NP. No openings today on RB or SG schedule - pt refused to schedule with TP. Please advise Dr Lamonte Sakai. Thanks.

## 2016-08-28 ENCOUNTER — Encounter: Payer: Self-pay | Admitting: Pulmonary Disease

## 2016-08-28 ENCOUNTER — Ambulatory Visit (INDEPENDENT_AMBULATORY_CARE_PROVIDER_SITE_OTHER): Payer: 59 | Admitting: Pulmonary Disease

## 2016-08-28 DIAGNOSIS — J440 Chronic obstructive pulmonary disease with acute lower respiratory infection: Secondary | ICD-10-CM | POA: Diagnosis not present

## 2016-08-28 DIAGNOSIS — J209 Acute bronchitis, unspecified: Secondary | ICD-10-CM | POA: Diagnosis not present

## 2016-08-28 MED ORDER — PREDNISONE 10 MG PO TABS: ORAL_TABLET | ORAL | 0 refills | Status: DC

## 2016-08-28 MED ORDER — DOXYCYCLINE HYCLATE 100 MG PO TABS: 100.0000 mg | ORAL_TABLET | Freq: Two times a day (BID) | ORAL | 0 refills | Status: DC

## 2016-08-28 NOTE — Addendum Note (Signed)
Addended by: Benson Setting L on: 08/28/2016 11:45 AM   Modules accepted: Orders

## 2016-08-28 NOTE — Progress Notes (Signed)
   Subjective:    Patient ID: Kim [name], female    DOB: 03/02/1956, 61 y.o.   MRN: [id number]  HPI  Patient is urgently here for an acute visit with cough, productive of yellow sputum, increased dyspnea.  She sees Dr. Lamonte Sakai. Last time she was seen at the office was in January 2018. She has COPD. She is not smoking.  She has been tired and fatigued x 2 weeks. She had cough + sinus congestion + post nasal drip x 2 days. (-) fevers, chills. (-) sick contacts. 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 (no recent sick contacts).   Review of Systems  Constitutional: Negative.   HENT: Positive for congestion, rhinorrhea and sinus pressure.   Eyes: Negative.   Respiratory: Positive for cough, shortness of breath and wheezing.   Cardiovascular: Negative.   Gastrointestinal: Negative.   Endocrine: Negative.   Genitourinary: Negative.   Musculoskeletal: Negative.   Allergic/Immunologic: Negative.   Neurological: Negative.   Hematological: Negative.   Psychiatric/Behavioral: Negative.        Objective:   Physical Exam  Vitals:  Vitals:   08/28/16 1050  BP: 126/62  Pulse: 76  SpO2: 94%  Weight: 120 lb 12.8 oz (54.8 kg)  Height: 5\' 3"  (1.6 m)    Constitutional/General:  Pleasant, well-nourished, well-developed, not in any distress,  Comfortably seating.  Well kempt  Body mass index is 21.4 kg/m. Wt Readings from Last 3 Encounters:  08/28/16 120 lb 12.8 oz (54.8 kg)  08/26/16 122 lb 6.4 oz (55.5 kg)  08/22/16 123 lb 6.4 oz (56 kg)     HEENT: Pupils equal and reactive to light and accommodation. Anicteric sclerae. Normal nasal mucosa.   No oral  lesions,  mouth clear,  oropharynx clear, no postnasal drip. (-) Oral thrush. No dental caries.  Airway - Mallampati class III  Neck: No masses. Midline trachea. No JVD, (-) LAD. (-) bruits appreciated.  Respiratory/Chest: Grossly normal chest. (-) deformity. (-) Accessory muscle use.  Symmetric  expansion. (-) Tenderness on palpation.  Resonant on percussion.  Diminished BS on both lower lung zones. Wheezing in both upper lung zones (-), crackles, rhonchi (-) egophony  Cardiovascular: Regular rate and  rhythm, heart sounds normal, no murmur or gallops, no peripheral edema  Gastrointestinal:  Normal bowel sounds. Soft, non-tender. No hepatosplenomegaly.  (-) masses.   Musculoskeletal:  Normal muscle tone. Normal gait.   Extremities: Grossly normal. (-) clubbing, cyanosis.  (-) edema  Skin: (-) rash,lesions seen.   Neurological/Psychiatric : alert, oriented to time, place, person. Normal mood and affect          Assessment & Plan:  Acute bronchitis with COPD ([place]) Patient would recent cough, congestion, nasal drip, dyspnea, wheezing. Chest x-ray with bronchitis. Prednisone taper. Antibiotics with doxycycline for a week. Cont Symbicort and Spiriva. Continue DuoNeb every 4 hours as needed. Needs a repeat chest x-ray to make sure there is resolution of small effusion (?).       Patient will follow up with Dr. Lamonte Sakai in 3 months    J. Shirl Harris, MD 08/28/2016   11:23 AM Pulmonary and [place] Pager: [phone] Office: [phone], Fax: [phone]

## 2016-08-28 NOTE — Patient Instructions (Signed)
It was a pleasure taking care of you today!  You are diagnosed with bronchitis.  Start prednisone 10 mg/tab, 2 tabs daily for 1 week followed by 1 tablet daily for 1 week. Start Doxycycline 100 mg/tab, 1 tab twice a day for 1 week if not better in a day or so. Continue Symbicort and Spiriva as prescribed. Continue DuoNeb as prescribed.   Return to clinic in 2-3 months with Dr. Lamonte Sakai

## 2016-08-28 NOTE — Assessment & Plan Note (Signed)
Patient would recent cough, congestion, nasal drip, dyspnea, wheezing. Chest x-ray with bronchitis. Prednisone taper. Antibiotics with doxycycline for a week. Cont Symbicort and Spiriva. Continue DuoNeb every 4 hours as needed. Needs a repeat chest x-ray to make sure there is resolution of small effusion (?).

## 2016-09-03 ENCOUNTER — Other Ambulatory Visit: Payer: Self-pay

## 2016-09-03 ENCOUNTER — Ambulatory Visit (HOSPITAL_COMMUNITY): Payer: 59 | Attending: Cardiovascular Disease

## 2016-09-03 DIAGNOSIS — I082 Rheumatic disorders of both aortic and tricuspid valves: Secondary | ICD-10-CM | POA: Insufficient documentation

## 2016-09-03 DIAGNOSIS — J449 Chronic obstructive pulmonary disease, unspecified: Secondary | ICD-10-CM | POA: Insufficient documentation

## 2016-09-03 DIAGNOSIS — I428 Other cardiomyopathies: Secondary | ICD-10-CM | POA: Diagnosis present

## 2016-09-03 DIAGNOSIS — E785 Hyperlipidemia, unspecified: Secondary | ICD-10-CM | POA: Insufficient documentation

## 2016-09-03 DIAGNOSIS — R0602 Shortness of breath: Secondary | ICD-10-CM | POA: Diagnosis not present

## 2016-09-03 DIAGNOSIS — I5181 Takotsubo syndrome: Secondary | ICD-10-CM | POA: Diagnosis not present

## 2016-09-03 DIAGNOSIS — R0989 Other specified symptoms and signs involving the circulatory and respiratory systems: Secondary | ICD-10-CM | POA: Diagnosis not present

## 2016-09-04 ENCOUNTER — Telehealth: Payer: Self-pay | Admitting: Internal Medicine

## 2016-09-04 MED ORDER — HYDROCODONE-ACETAMINOPHEN 7.5-325 MG PO TABS: 1.0000 | ORAL_TABLET | ORAL | 0 refills | Status: DC | PRN

## 2016-09-04 NOTE — Telephone Encounter (Signed)
Rx printed waiting to be signed.  

## 2016-09-04 NOTE — Telephone Encounter (Signed)
° ° °  Pt request refill of the following: ° °HYDROcodone-acetaminophen (NORCO) 7.5-325 MG tablet ° ° °Phamacy: °

## 2016-09-05 ENCOUNTER — Telehealth: Payer: Self-pay

## 2016-09-05 NOTE — Telephone Encounter (Signed)
Rx for Hydrocodone 7.5-325 mg has been signed and ready for pick up in the front office, patient is aware.

## 2016-09-10 ENCOUNTER — Ambulatory Visit (INDEPENDENT_AMBULATORY_CARE_PROVIDER_SITE_OTHER): Payer: 59 | Admitting: Internal Medicine

## 2016-09-10 ENCOUNTER — Encounter: Payer: Self-pay | Admitting: Internal Medicine

## 2016-09-10 ENCOUNTER — Telehealth: Payer: Self-pay | Admitting: Emergency Medicine

## 2016-09-10 VITALS — BP 84/54 | HR 82 | Ht 63.0 in | Wt 124.0 lb

## 2016-09-10 DIAGNOSIS — I5041 Acute combined systolic (congestive) and diastolic (congestive) heart failure: Secondary | ICD-10-CM

## 2016-09-10 DIAGNOSIS — R058 Other specified cough: Secondary | ICD-10-CM

## 2016-09-10 DIAGNOSIS — R05 Cough: Secondary | ICD-10-CM

## 2016-09-10 DIAGNOSIS — J449 Chronic obstructive pulmonary disease, unspecified: Secondary | ICD-10-CM | POA: Diagnosis not present

## 2016-09-10 MED ORDER — TIOTROPIUM BROMIDE MONOHYDRATE 2.5 MCG/ACT IN AERS: 2.0000 | INHALATION_SPRAY | Freq: Every day | RESPIRATORY_TRACT | 0 refills | Status: DC

## 2016-09-10 MED ORDER — PREDNISONE 10 MG PO TABS: ORAL_TABLET | ORAL | 0 refills | Status: DC

## 2016-09-10 NOTE — Telephone Encounter (Signed)
Spoke with the pt  Still wheezing and has chest congestion despite pred and doxy  OV with MW at 3:30 with all meds

## 2016-09-10 NOTE — Progress Notes (Signed)
Subjective:    Patient ID: Kim [name], female    DOB: October 03, 1955,    MRN: [id number]    08/28/16 [place] acute ov  Patient is urgently here for an acute visit with cough, productive of yellow sputum, increased dyspnea. She sees Dr. Lamonte Sakai. Last time she was seen at the office was in January 2018. She has COPD. She is not smoking. She has been tired and fatigued x 2 weeks. She had cough + sinus congestion + post nasal drip x 2 days. (-) fevers, chills. (-) sick contacts. CXR on 5/22 w/o PNA rec Start prednisone 10 mg/tab, 2 tabs daily for 1 week followed by 1 tablet daily for 1 week. Start Doxycycline 100 mg/tab, 1 tab twice a day for 1 week if not better in a day or so. Continue Symbicort and Spiriva as prescribed. Continue DuoNeb as prescribed.   09/10/2016  f/u ov/Wert re: refractory cough on lisinopril  Chief Complaint  Patient presents with  . Acute Visit    Pt c/o increased cough and wheezing for the past 3-4 wks. She states laughing or taking a deep breath can trigger the cough. Cough will occ wake her up in the night.    Severe day >noct 24/7 coughing fits to point of  Urinary incontinence x 4 weeks no better p doxy/pred  > prod maybe 1 tbp/day  white mucus only  Assoc subj wheezing but minimal sob if not wheezing  Worse with voice use   No obvious day to day or daytime variability or assoc purulent sputum or mucus plugs or hemoptysis or cp or chest tightness, subjective wheeze or overt sinus or hb symptoms. No unusual exp hx or h/o childhood pna/ asthma or knowledge of premature birth.  Sleeping ok without nocturnal  or early am exacerbation  of respiratory  c/o's or need for noct saba. Also denies any obvious fluctuation of symptoms with weather or environmental changes or other aggravating or alleviating factors except as outlined above   Current Medications, Allergies, Complete Past Medical History, Past Surgical History, Family History, and Social History were reviewed in  [employer] record.  ROS  The following are not active complaints unless bolded sore throat, dysphagia, dental problems, itching, sneezing,  nasal congestion or excess/ purulent secretions, ear ache,   fever, chills, sweats, unintended wt loss, classically pleuritic or exertional cp,  orthopnea pnd or leg swelling, presyncope, palpitations, abdominal pain, anorexia, nausea, vomiting, diarrhea  or change in bowel or bladder habits, change in stools or urine, dysuria,hematuria,  rash, arthralgias, visual complaints, headache, numbness, weakness or ataxia or problems with walking or coordination,  change in mood/affect or memory.                 Objective:   Physical Exam  amb hoarse wf nad  Wt Readings from Last 3 Encounters:  09/10/16 124 lb (56.2 kg)  08/28/16 120 lb 12.8 oz (54.8 kg)  08/26/16 122 lb 6.4 oz (55.5 kg)    Vital signs reviewed -  - Note on arrival 02 sats  94% on RA and bp 84/54       HEENT: nl   turbinates bilaterally, and oropharynx. Nl external ear canals without cough reflex - full dentures    NECK :  without JVD/Nodes/TM/ nl carotid upstrokes bilaterally   LUNGS: no acc muscle use,  slt barrel contour chest with cough on inspiration  But no audible wheezes/ bs distant bilaterally    CV:  RRR  no s3 or murmur or increase in P2, and no edema   ABD:  soft and nontender with nl inspiratory excursion in the supine position. No bruits or organomegaly appreciated, bowel sounds nl  MS:  Nl gait/ ext warm without deformities, calf tenderness, cyanosis or clubbing No obvious joint restrictions   SKIN: warm and dry without lesions    NEURO:  alert, approp, nl sensorium with  no motor or cerebellar deficits apparent.            Assessment & Plan:

## 2016-09-10 NOTE — Patient Instructions (Addendum)
Plan A = Automatic = symbicort 160 2 every 12 hours and spiriva 2 pffs each am   Work on inhaler technique:  relax and gently blow all the way out then take a nice smooth deep breath back in, triggering the inhaler at same time you start breathing in.  Hold for up to 5 seconds if you can. Blow out thru nose. Rinse and gargle with water when done    Plan B = Backup Only use your albuterol as a rescue medication to be used if you can't catch your breath by resting or doing a relaxed purse lip breathing pattern.  - The less you use it, the better it will work when you need it. - Ok to use the inhaler up to 2 puffs  every 4 hours if you must but call for appointment if use goes up over your usual need - Don't leave home without it !!  (think of it like the spare tire for your car)   Plan C = Crisis - only use your albuterol nebulizer if you first try Plan B and it fails to help > ok to use the nebulizer up to every 4 hours but if start needing it regularly call for immediate appointment   Prednisone 10 mg take  4 each am x 2 days,   2 each am x 2 days,  1 each am x 2 days and stop   For cough > mucinex 1200 mg every 12hours and supplement with Norco up to every 4 hours/ use the flutter valve as much as possible   Stop lisinopril   Try prilosec otc 20mg   Take 30-60 min before first meal of the day and Pepcid ac (famotidine) 20 mg one @  bedtime until cough is completely gone for at least a week without the need for cough suppression     GERD (REFLUX)  is an extremely common cause of respiratory symptoms just like yours , many times with no obvious heartburn at all.    It can be treated with medication, but also with lifestyle changes including elevation of the head of your bed (ideally with 6 inch  bed blocks),  Smoking cessation, avoidance of late meals, excessive alcohol, and avoid fatty foods, chocolate, peppermint, colas, red wine, and acidic juices such as orange juice.  NO MINT OR MENTHOL  PRODUCTS SO NO COUGH DROPS   USE SUGARLESS CANDY INSTEAD (Jolley ranchers or Stover's or Life Savers) or even ice chips will also do - the key is to swallow to prevent all throat clearing. NO OIL BASED VITAMINS - use powdered substitutes.    Please schedule a follow up office visit in 2 weeks, sooner if needed to See Eric Form

## 2016-09-11 DIAGNOSIS — R058 Other specified cough: Secondary | ICD-10-CM | POA: Insufficient documentation

## 2016-09-11 DIAGNOSIS — R05 Cough: Secondary | ICD-10-CM | POA: Insufficient documentation

## 2016-09-11 NOTE — Assessment & Plan Note (Signed)
Upper airway cough syndrome (previously labeled PNDS) , is  so named because it's frequently impossible to sort out how much is  CR/sinusitis with freq throat clearing (which can be related to primary GERD)   vs  causing  secondary (" extra esophageal")  GERD from wide swings in gastric pressure that occur with throat clearing, often  promoting self use of mint and menthol lozenges that reduce the lower esophageal sphincter tone and exacerbate the problem further in a cyclical fashion.   These are the same pts (now being labeled as having "irritable larynx syndrome" by some cough centers) who not infrequently have a history of having failed to tolerate ace inhibitors,  dry powder inhalers (both may apply here) or biphosphonates or report having atypical/extraesophageal reflux symptoms that don't respond to standard doses of PPI  and are easily confused as having aecopd or asthma flares by even experienced allergists/ pulmonologists (myself included).    rec try off acei/ dpi and short term gerd rx / control cough with mucinex dm/ norco and flutter    I had an extended discussion with the patient reviewing all relevant studies completed to date and  lasting 25 minutes of a 40  minute acute visit with with not previously known to me  re  severe non-specific but potentially very serious refractory respiratory symptoms of uncertain and potentially multiple  etiologies.   Each maintenance medication was reviewed in detail including most importantly the difference between maintenance and prns and under what circumstances the prns are to be triggered using an action plan format that is not reflected in the computer generated alphabetically organized AVS.    Please see AVS for specific instructions unique to this office visit that I personally wrote and verbalized to the the pt in detail and then reviewed with pt  by my nurse highlighting any changes in therapy/plan of care  recommended at today's visit.

## 2016-09-11 NOTE — Assessment & Plan Note (Signed)
PFT's  09/10/2016  FEV1 1.05 (43 % ) ratio 48  p 5 % improvement from saba p ? prior to study with DLCO  51 % corrects to 57  % for alv volume   -  09/10/2016  After extensive coaching device  effectiveness =    75% with smi > change spriva to respimat   Does not appear to be having a typical copd exac but rather a severe upper airway cough for which the dpi's may be contraindicated > will try the respimat but no other changes in maint rx and recheck in 2 weeks  see avs for instructions unique to this ov

## 2016-09-11 NOTE — Assessment & Plan Note (Addendum)
D/c acei  09/10/2016 due to low bp and cough > f/u in 2 weeks planned ? Add arb then ?    In the best review of chronic cough to date ( NEJM 2016 375 [phone]) ,  ACEi are now felt to cause cough in up to  20% of pts which is a 4 fold increase from previous reports and does not include the variety of non-specific complaints we see in pulmonary clinic in pts on ACEi but previously attributed to another dx like  Copd/asthma and  include PNDS, throat and chest congestion, "bronchitis", unexplained dyspnea and noct "strangling" sensations, and hoarseness, but also  atypical /refractory GERD symptoms like dysphagia and "bad heartburn"   The only way I know  to prove this is not an "ACEi Case" is a trial off ACEi x a minimum of 6 weeks then regroup- her bp is too low to try arb today so will hold acei and return in 2 weeks to regroup.

## 2016-10-01 ENCOUNTER — Ambulatory Visit: Payer: 59 | Admitting: Acute Care

## 2016-10-03 ENCOUNTER — Other Ambulatory Visit: Payer: Self-pay | Admitting: Cardiovascular Disease

## 2016-10-13 ENCOUNTER — Telehealth: Payer: Self-pay | Admitting: Internal Medicine

## 2016-10-13 MED ORDER — HYDROCODONE-ACETAMINOPHEN 7.5-325 MG PO TABS: 1.0000 | ORAL_TABLET | ORAL | 0 refills | Status: DC | PRN

## 2016-10-13 NOTE — Telephone Encounter (Signed)
Rx printed awaiting to be signed.  

## 2016-10-13 NOTE — Telephone Encounter (Signed)
Pt notified Rx ready for pickup. Rx printed and signed.  

## 2016-10-13 NOTE — Telephone Encounter (Signed)
° ° °  Pt request refill of the following: ° °HYDROcodone-acetaminophen (NORCO) 7.5-325 MG tablet ° ° °Phamacy: °

## 2016-10-17 ENCOUNTER — Encounter: Payer: Self-pay | Admitting: Internal Medicine

## 2016-10-17 ENCOUNTER — Ambulatory Visit (INDEPENDENT_AMBULATORY_CARE_PROVIDER_SITE_OTHER): Payer: 59 | Admitting: Internal Medicine

## 2016-10-17 VITALS — BP 112/62 | HR 75 | Temp 98.8°F | Ht 63.0 in | Wt 125.8 lb

## 2016-10-17 DIAGNOSIS — I5041 Acute combined systolic (congestive) and diastolic (congestive) heart failure: Secondary | ICD-10-CM | POA: Diagnosis not present

## 2016-10-17 DIAGNOSIS — R05 Cough: Secondary | ICD-10-CM

## 2016-10-17 DIAGNOSIS — J449 Chronic obstructive pulmonary disease, unspecified: Secondary | ICD-10-CM | POA: Diagnosis not present

## 2016-10-17 DIAGNOSIS — R058 Other specified cough: Secondary | ICD-10-CM

## 2016-10-17 MED ORDER — PREDNISONE 10 MG PO TABS: ORAL_TABLET | ORAL | 0 refills | Status: DC

## 2016-10-17 NOTE — Patient Instructions (Addendum)
Omeprazole 20 mg daily  Acute bronchitis symptoms  are generally not helped by antibiotics.  Take over-the-counter expectorants and cough medications such as  Mucinex DM.  Call if there is no improvement in 5 to 7 days or if  you develop worsening cough, fever, or new symptoms, such as shortness of breath or chest pain.  Hydrate and Humidify  Drink enough water to keep your urine clear or pale yellow. Staying hydrated will help to thin your mucus.  Use a cool mist humidifier to keep the humidity level in your home above 50%.  Inhale steam for 10-15 minutes, 3-4 times a day or as told by your health care provider. You can do this in the bathroom while a hot shower is running.  Limit your exposure to cool or dry air. Rest  Rest as much as possible.  Sleep with your head raised (elevated).  Make sure to get enough sleep each night.

## 2016-10-17 NOTE — Progress Notes (Signed)
Subjective:    Patient ID: Kim [name], female    DOB: [birth year], 61 y.o.   MRN: [id number]  HPI  61 year old patient who has a history of COPD, on maximal maintenance therapy who is followed by pulmonary medicine.  She has an approximate 4 week history of refractory cough.  Patient has been treated with prednisone and has been using Mucinex.  She has been using hydrocodone for cough suppressant.  She was seen by pulmonary who recommended Prilosec.  The patient has not been using this medication.  Ace inhibition has been discontinued. Denies much in the way of rhinorrhea No active wheezing.  A chest x-ray last month revealed no chest abnormalities  Past Medical History:  Diagnosis Date  . C8 RADICULOPATHY 06/05/2009  . COPD 08/31/2007  . DEPRESSION 11/19/2006  . DYSLIPIDEMIA 03/12/2009  . MI (mitral incompetence)   . 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 OSTEOARTHRITIS 06/05/2009  . PARESTHESIA 08/24/2007  . TOBACCO ABUSE 01/25/2010     Social History   Social History  . Marital status: Married    Spouse name: N/A  . Number of children: 2  . Years of education: N/A   Occupational History  . Patient care aide-sits with elderly lady    Social History Main Topics  . Smoking status: Former Smoker    Packs/day: 2.00    Years: 38.00    Types: Cigarettes    Quit date: 03/07/2014  . Smokeless tobacco: Never Used  . Alcohol use No  . Drug use: No  . Sexual activity: Not on file   Other Topics Concern  . Not on file   Social History Narrative  . No narrative on file    Past Surgical History:  Procedure Laterality Date  . APPENDECTOMY  1969  . CARDIAC CATHETERIZATION N/A 07/05/2015   Procedure: Left Heart Cath and Coronary Angiography;  Surgeon: Leonie Man, MD;  Location: [facility];  Service: Cardiovascular;  Laterality: N/A;  . CHOLECYSTECTOMY  1989  . collapse lung  1990  . EXPLORATORY LAPAROTOMY  1969    Family History  Problem Relation Age of Onset  .  Cancer Mother        lung  . Heart attack Mother   . Hypertension Sister   . Multiple sclerosis Brother   . Diabetes Sister   . Rheum arthritis Sister   . Thyroid disease Sister   . Multiple sclerosis Other   . Alcohol abuse Other   . Arthritis Other   . Diabetes Other   . Kidney disease Other   . Cancer Other        lung,ovarian,skin, uterine  . Stroke Other   . Heart disease Other   . Melanoma Other   . Heart attack Maternal Uncle   . Heart attack Other        NEICE  . Heart attack Other        NEPHEW  . Hypertension Brother   . Stroke Maternal Grandmother   . Colon cancer Neg Hx     Allergies  Allergen Reactions  . Penicillins Hives    Has patient had a PCN reaction causing immediate rash, facial/tongue/throat swelling, SOB or lightheadedness with hypotension: Yes Has patient had a PCN reaction causing severe rash involving mucus membranes or skin necrosis: No Has patient had a PCN reaction that required hospitalization No Has patient had a PCN reaction occurring within the last 10 years: No If all of the above answers are "NO", then may  proceed with Cephalosporin use.     Current Outpatient Prescriptions on File Prior to Visit  Medication Sig Dispense Refill  . albuterol (PROVENTIL) (2.5 MG/3ML) 0.083% nebulizer solution Take 2.5 mg by nebulization every 6 (six) hours as needed for wheezing or shortness of breath.    . ALPRAZolam (XANAX) 0.5 MG tablet TAKE 1/2 TO 1 TABLET BY MOUTH 3 TIMES A DAY 90 tablet 2  . aspirin EC 81 MG EC tablet Take 1 tablet (81 mg total) by mouth daily.    . carvedilol (COREG) 3.125 MG tablet TAKE 1 TABLET BY MOUTH 2 TIMES DAILY WITH MEALS 60 tablet 9  . Cholecalciferol (VITAMIN D3) 1000 units CAPS Take 1 capsule (1,000 Units total) by mouth 2 (two) times daily. 60 capsule 6  . furosemide (LASIX) 40 MG tablet Take 40 mg by mouth as needed (for weight gain of 3 lbs).   0  . HYDROcodone-acetaminophen (NORCO) 7.5-325 MG tablet Take 1 tablet by  mouth every 4 (four) hours as needed for moderate pain. 90 tablet 0  . nitroGLYCERIN (NITROSTAT) 0.4 MG SL tablet Place 1 tablet (0.4 mg total) under the tongue every 5 (five) minutes as needed for chest pain. 25 tablet 3  . PAXIL CR 25 MG 24 hr tablet Take 1 tablet by mouth  every morning 90 tablet 3  . pravastatin (PRAVACHOL) 20 MG tablet take 1 tablet by mouth every evening 30 tablet 5  . SYMBICORT 160-4.5 MCG/ACT inhaler inhale 2 puffs by mouth twice a day 10.2 Inhaler 5  . Tiotropium Bromide Monohydrate (SPIRIVA RESPIMAT) 2.5 MCG/ACT AERS Inhale 2 puffs into the lungs daily. 1 Inhaler 0  . VENTOLIN HFA 108 (90 Base) MCG/ACT inhaler INHALE 2 PUFFS INTO THE LUNGS EVERY 4 HOURS AS NEEDED FOR WHEEZING OR SHORTNESS OF BREATH 18 Inhaler 2  . vitamin B-12 (CYANOCOBALAMIN) 500 MCG tablet Take 500 mcg by mouth 2 (two) times daily.     No current facility-administered medications on file prior to visit.     BP 112/62 (BP Location: Left Arm, Patient Position: Sitting, Cuff Size: Normal)   Pulse 75   Temp 98.8 F (37.1 C) (Oral)   Ht 5\' 3"  (1.6 m)   Wt 125 lb 12.8 oz (57.1 kg)   SpO2 93%   BMI 22.28 kg/m     Review of Systems  Constitutional: Negative.   HENT: Positive for sore throat. Negative for congestion, dental problem, hearing loss, rhinorrhea, sinus pressure and tinnitus.   Eyes: Negative for pain, discharge and visual disturbance.  Respiratory: Positive for cough. Negative for shortness of breath.   Cardiovascular: Negative for chest pain, palpitations and leg swelling.  Gastrointestinal: Negative for abdominal distention, abdominal pain, blood in stool, constipation, diarrhea, nausea and vomiting.  Genitourinary: Negative for difficulty urinating, dysuria, flank pain, frequency, hematuria, pelvic pain, urgency, vaginal bleeding, vaginal discharge and vaginal pain.  Musculoskeletal: Negative for arthralgias, gait problem and joint swelling.  Skin: Negative for rash.  Neurological:  Negative for dizziness, syncope, speech difficulty, weakness, numbness and headaches.  Hematological: Negative for adenopathy.  Psychiatric/Behavioral: Negative for agitation, behavioral problems and dysphoric mood. The patient is not nervous/anxious.        Objective:   Physical Exam  Constitutional: She is oriented to person, place, and time. She appears well-developed and well-nourished. No distress.  No distress Afebrile Frequent paroxysms of cough  HENT:  Head: Normocephalic.  Right Ear: External ear normal.  Left Ear: External ear normal.  Mouth/Throat: Oropharynx is clear and  moist.  Eyes: Pupils are equal, round, and reactive to light. Conjunctivae and EOM are normal.  Neck: Normal range of motion. Neck supple. No thyromegaly present.  Cardiovascular: Normal rate, regular rhythm, normal heart sounds and intact distal pulses.   Pulmonary/Chest: Effort normal and breath sounds normal. No respiratory distress.  Scattered coarse rhonchi best heard posteriorly in expiration No active wheezing  Abdominal: Soft. Bowel sounds are normal. She exhibits no mass. There is no tenderness.  Musculoskeletal: Normal range of motion.  Lymphadenopathy:    She has no cervical adenopathy.  Neurological: She is alert and oriented to person, place, and time.  Skin: Skin is warm and dry. No rash noted.  Psychiatric: She has a normal mood and affect. Her behavior is normal.          Assessment & Plan:  COPD Upper airway cough syndrome.  Patient was encouraged to use PPI therapy, at least short-term.  Continue mucolytic's.  Will hydrate and treat with antitussives. Pulmonary follow-up.  Will treat with prednisone [place]

## 2016-10-21 ENCOUNTER — Other Ambulatory Visit: Payer: Self-pay | Admitting: Internal Medicine

## 2016-10-23 ENCOUNTER — Encounter: Payer: Self-pay | Admitting: Acute Care

## 2016-10-23 ENCOUNTER — Ambulatory Visit (INDEPENDENT_AMBULATORY_CARE_PROVIDER_SITE_OTHER): Payer: 59 | Admitting: Acute Care

## 2016-10-23 VITALS — BP 114/62 | HR 71 | Ht 63.0 in | Wt 127.6 lb

## 2016-10-23 DIAGNOSIS — R05 Cough: Secondary | ICD-10-CM

## 2016-10-23 DIAGNOSIS — Z87891 Personal history of nicotine dependence: Secondary | ICD-10-CM | POA: Diagnosis not present

## 2016-10-23 DIAGNOSIS — R058 Other specified cough: Secondary | ICD-10-CM

## 2016-10-23 DIAGNOSIS — R053 Chronic cough: Secondary | ICD-10-CM

## 2016-10-23 LAB — NITRIC OXIDE

## 2016-10-23 NOTE — Assessment & Plan Note (Signed)
Continued upper airway cough despite discontinuation of ACEI, and compliance with all of the recommendations Treated multiple times with prednisone and antibiotics 1 Patient states she needs a stronger antibiotic Plan Concern for continued cough in patient with previous history of heavy smoking CT Sinuses.and Chest without contrast We will call you with results Throat soothing : sugar free jolly ranchers . Sips of water instead of throat clearing. GERD diet FENO today Follow up with Dr.Byrum in August as is scheduled. Continue Prilosec as you have been doing ENT consult for Upper Airway CoughSyndrome Change any oil based vitamins. Complete current prednisone taper. Continue inhaler and nebs as you have been doing. Do not drive when sleepy. Please contact office for sooner follow up if symptoms do not improve or worsen or seek emergency care

## 2016-10-23 NOTE — Patient Instructions (Addendum)
CT Sinuses.and Chest without contrast Throat soothing : sugar free jolly ranchers . Sips of water instead of throat clearing. GERD diet FENO today Follow up with Dr.Byrum in August as is scheduled. Continue Prilosec as you have been doing ENT consult for Upper Airway CoughSyndrome Change any oil based vitamins. Complete current prednisone taper. Continue inhaler and nebs as you have been doing. Do not drive when sleepy. Please contact office for sooner follow up if symptoms do not improve or worsen or seek emergency care

## 2016-10-23 NOTE — Progress Notes (Signed)
History of Present Illness Kim [name] is a 61 y.o. female former smoker ( Quit 2015, 70 pack year history) with COPD, allergic rhinitis and cough. She is followed by Dr. Lamonte Sakai.   10/23/2016 4 week follow up. Pt. Presents for follow up for slow to resolve upper airway cough. She states this cough has been chronic since May 2018 She saw her PCP at the end of May for cough. At that her PCP did not prescribe any medication. She initially saw Dr. Corrie Dandy 08/28/2016 and was treated with Doxycycline and a prednisone taper at that time.She stated the prednisone did help initially, but then when she gets to the 40 mg daily dose, the cough comes back.She was then seen by Dr. Melvyn Novas plan at that time was as below:   Prednisone 10 mg take  4 each am x 2 days,   2 each am x 2 days,  1 each am x 2 days and stop   For cough > mucinex 1200 mg every 12hours and supplement with Norco up to every 4 hours/ use the flutter valve as much as possible   Stop lisinopril   Try prilosec otc 20mg   Take 30-60 min before first meal of the day and Pepcid ac (famotidine) 20 mg one @  bedtime until cough is completely gone for at least a week without the need for cough suppression     GERD (REFLUX)  is an extremely common cause of respiratory symptoms just like yours , many times with no obvious heartburn at all.   Patient  did not improve despite compliance with Mucinex twice daily, hydrocodone to treat cough, discontinuation of lisinopril, and utilization of flutter valve 3 times daily, with nebs 3 times daily. Additionally she is compliant with her Prilosec. She has changed Spiriva to respimat  per Dr. Gustavus Bryant previous order. She did not stop taking an oil based vitamin which I have asked her to convert to a nonoil based vitamin She was seen again by her PCP Friday 10/17/2016, and the prednisone taper was repeated again. She is now on the 10 mg x 4 days portion of the taper. She states she has white, yellow and , green  secretions. She denies fever, chest pain, orthopnea or hemoptysis.She quit smoking in 2015. She has a 76-pack-year smoking history. She is using Mucunex 2 times daily. She is using hydrocodone to treat cough.She stopped her lisinopril. She states she is using the flutter valve 3 times daily and the nebulizer 3 times daily.She is compliant with her prilosec. She is taking an oil based vitamin that I have asked her to convert to a non-oil based medication. She is coughing in the office today, and she is frequently clearing her throat.   Test Results: Chest x-ray 08/26/2016 IMPRESSION: Possible minimal bilateral pleural effusions. Aortic atherosclerosis. No other abnormality seen in the chest.   CBC Latest Ref Rng & Units 08/26/2016 03/07/2016 08/02/2015  WBC 3.4 - 10.8 x10E3/uL 8.3 9.7 9.0  Hemoglobin 11.1 - 15.9 g/dL 14.0 13.6 12.6  Hematocrit 34.0 - 46.6 % 42.6 40.5 38.8  Platelets 150 - 379 x10E3/uL 297 275.0 374    BMP Latest Ref Rng & Units 08/26/2016 03/07/2016 08/02/2015  Glucose 65 - 99 mg/dL 104(H) 98 109(H)  BUN 8 - 27 mg/dL 20 22 20   Creatinine 0.57 - 1.00 mg/dL 0.71 0.91 0.89  BUN/Creat Ratio 12 - 28 28 - -  Sodium 134 - 144 mmol/L 142 143 139  Potassium 3.5 -  5.2 mmol/L 4.9 4.2 3.9  Chloride 96 - 106 mmol/L 103 104 104  CO2 18 - 29 mmol/L 23 30 25   Calcium 8.7 - 10.3 mg/dL 9.6 10.0 9.4    BNP    Component Value Date/Time   BNP 330.4 (H) 07/07/2015 0953    ProBNP    Component Value Date/Time   PROBNP 43 08/26/2016 1058    PFT    Component Value Date/Time   FEV1PRE 1.00 11/16/2014 1305   FEV1POST 1.05 11/16/2014 1305   FVCPRE 2.00 11/16/2014 1305   FVCPOST 2.21 11/16/2014 1305   TLC 5.06 11/16/2014 1305   DLCOUNC 11.63 11/16/2014 1305   PREFEV1FVCRT 50 11/16/2014 1305   PSTFEV1FVCRT 48 11/16/2014 1305    No results found.   Past medical hx Past Medical History:  Diagnosis Date  . C8 RADICULOPATHY 06/05/2009  . COPD 08/31/2007  . DEPRESSION 11/19/2006  .  DYSLIPIDEMIA 03/12/2009  . MI (mitral incompetence)   . 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 OSTEOARTHRITIS 06/05/2009  . PARESTHESIA 08/24/2007  . TOBACCO ABUSE 01/25/2010     Social History  Substance Use Topics  . Smoking status: Former Smoker    Packs/day: 2.00    Years: 38.00    Types: Cigarettes    Quit date: 03/07/2014  . Smokeless tobacco: Never Used  . Alcohol use No    Tobacco Cessation: Former smoker  Past surgical hx, Family hx, Social hx all reviewed.  Current Outpatient Prescriptions on File Prior to Visit  Medication Sig  . albuterol (PROVENTIL) (2.5 MG/3ML) 0.083% nebulizer solution Take 2.5 mg by nebulization every 6 (six) hours as needed for wheezing or shortness of breath.  . ALPRAZolam (XANAX) 0.5 MG tablet TAKE 1/2 TO 1 TABLET BY MOUTH 3 TIMES A DAY  . aspirin EC 81 MG EC tablet Take 1 tablet (81 mg total) by mouth daily.  . carvedilol (COREG) 3.125 MG tablet TAKE 1 TABLET BY MOUTH 2 TIMES DAILY WITH MEALS  . Cholecalciferol (VITAMIN D3) 1000 units CAPS Take 1 capsule (1,000 Units total) by mouth 2 (two) times daily.  . furosemide (LASIX) 40 MG tablet Take 40 mg by mouth as needed (for weight gain of 3 lbs).   Marland Kitchen HYDROcodone-acetaminophen (NORCO) 7.5-325 MG tablet Take 1 tablet by mouth every 4 (four) hours as needed for moderate pain.  . nitroGLYCERIN (NITROSTAT) 0.4 MG SL tablet Place 1 tablet (0.4 mg total) under the tongue every 5 (five) minutes as needed for chest pain.  Marland Kitchen PAXIL CR 25 MG 24 hr tablet TAKE 1 TABLET BY MOUTH  EVERY MORNING  . pravastatin (PRAVACHOL) 20 MG tablet take 1 tablet by mouth every evening  . SYMBICORT 160-4.5 MCG/ACT inhaler inhale 2 puffs by mouth twice a day  . Tiotropium Bromide Monohydrate (SPIRIVA RESPIMAT) 2.5 MCG/ACT AERS Inhale 2 puffs into the lungs daily.  . VENTOLIN HFA 108 (90 Base) MCG/ACT inhaler INHALE 2 PUFFS INTO THE LUNGS EVERY 4 HOURS AS NEEDED FOR WHEEZING OR SHORTNESS OF BREATH  . vitamin B-12 (CYANOCOBALAMIN) 500 MCG  tablet Take 500 mcg by mouth 2 (two) times daily.   No current facility-administered medications on file prior to visit.      Allergies  Allergen Reactions  . Penicillins Hives    Has patient had a PCN reaction causing immediate rash, facial/tongue/throat swelling, SOB or lightheadedness with hypotension: Yes Has patient had a PCN reaction causing severe rash involving mucus membranes or skin necrosis: No Has patient had a PCN reaction that required hospitalization No  Has patient had a PCN reaction occurring within the last 10 years: No If all of the above answers are "NO", then may proceed with Cephalosporin use.     Review Of Systems:  Constitutional:   No  weight loss, night sweats,  Fevers, chills, fatigue, or  lassitude.  HEENT:   No headaches,  Difficulty swallowing,  Tooth/dental problems, or  Sore throat,                No sneezing, itching, ear ache, nasal congestion, post nasal drip,   CV:  No chest pain,  Orthopnea, PND, swelling in lower extremities, anasarca, dizziness, palpitations, syncope.   GI  No heartburn, indigestion, abdominal pain, nausea, vomiting, diarrhea, change in bowel habits, loss of appetite, bloody stools.   Resp: No shortness of breath with exertion or at rest.  No excess mucus, + productive cough,  + non-productive cough,  No coughing up of blood.  + change in color of mucus.  + wheezing.  No chest wall deformity  Skin: no rash or lesions.  GU: no dysuria, change in color of urine, no urgency or frequency.  No flank pain, no hematuria   MS:  No joint pain or swelling.  No decreased range of motion.  No back pain.  Psych:  No change in mood or affect. No depression or anxiety.  No memory loss.   Vital Signs BP 114/62 (BP Location: Left Arm, Patient Position: Sitting, Cuff Size: Normal)   Pulse 71   Ht 5\' 3"  (1.6 m)   Wt 127 lb 9.6 oz (57.9 kg)   SpO2 94%   BMI 22.60 kg/m    Physical Exam:  General- No distress,  A&Ox3, Pleasant ENT:  No sinus tenderness, TM clear, pale nasal mucosa, no oral exudate,no post nasal drip, no LAN Cardiac: S1, S2, regular rate and rhythm, no murmur Chest: Upper airway wheeze, no wheezing per lung fields/ no rales/ dullness; no accessory muscle use, no nasal flaring, no sternal retractions Abd.: Soft Non-tender Ext: No clubbing cyanosis, edema Neuro:  normal strength Skin: No rashes, warm and dry Psych: normal mood and behavior   Assessment/Plan  Upper airway cough syndrome Continued upper airway cough despite discontinuation of ACEI, and compliance with all of the recommendations Treated multiple times with prednisone and antibiotics 1 Patient states she needs a stronger antibiotic Plan Concern for continued cough in patient with previous history of heavy smoking CT Sinuses.and Chest without contrast We will call you with results Throat soothing : sugar free jolly ranchers . Sips of water instead of throat clearing. GERD diet FENO today Follow up with Dr.Byrum in August as is scheduled. Continue Prilosec as you have been doing ENT consult for Upper Airway CoughSyndrome Change any oil based vitamins. Complete current prednisone taper. Continue inhaler and nebs as you have been doing. Do not drive when sleepy. Please contact office for sooner follow up if symptoms do not improve or worsen or seek emergency care      Magdalen Spatz, NP 10/23/2016  3:17 PM

## 2016-10-28 ENCOUNTER — Ambulatory Visit (INDEPENDENT_AMBULATORY_CARE_PROVIDER_SITE_OTHER)
Admission: RE | Admit: 2016-10-28 | Discharge: 2016-10-28 | Disposition: A | Discharge status: Home / Self Care | Payer: 59 | Source: Ambulatory Visit | Attending: Acute Care | Admitting: Acute Care

## 2016-10-28 DIAGNOSIS — Z87891 Personal history of nicotine dependence: Secondary | ICD-10-CM

## 2016-10-28 DIAGNOSIS — R05 Cough: Secondary | ICD-10-CM | POA: Diagnosis not present

## 2016-10-28 DIAGNOSIS — R053 Chronic cough: Secondary | ICD-10-CM

## 2016-10-30 ENCOUNTER — Telehealth: Payer: Self-pay | Admitting: Emergency Medicine

## 2016-10-30 NOTE — Progress Notes (Signed)
Spoke with patient and informed her of results and recommendations. Pt did not have any questions and verbalized understanding. Nothing further is needed.

## 2016-10-30 NOTE — Telephone Encounter (Signed)
Notes recorded by Magdalen Spatz, NP on 10/29/2016 at 1:33 PM EDT Please call patient alert and had her CT chest did not indicate pneumonia or any other cause of her cough. Let her know it did show emphysema, stable pulmonary nodules that are consistent with a benign cause, and some coronary artery disease ( She is taking a statin medication for this). Have her follow with Dr. Lamonte Sakai as is scheduled in August. Thanks so much.     Spoke with pt and notified of results per SG Pt verbalized understanding and denied any questions.

## 2016-11-04 ENCOUNTER — Emergency Department (HOSPITAL_COMMUNITY): Payer: 59

## 2016-11-04 ENCOUNTER — Emergency Department (HOSPITAL_COMMUNITY)
Admission: EM | Admit: 2016-11-04 | Discharge: 2016-11-04 | Disposition: A | Discharge status: Home / Self Care | Payer: 59 | Attending: Emergency Medicine | Admitting: Emergency Medicine

## 2016-11-04 ENCOUNTER — Encounter (HOSPITAL_COMMUNITY): Payer: Self-pay | Admitting: Emergency Medicine

## 2016-11-04 DIAGNOSIS — R0602 Shortness of breath: Secondary | ICD-10-CM | POA: Diagnosis present

## 2016-11-04 DIAGNOSIS — I5042 Chronic combined systolic (congestive) and diastolic (congestive) heart failure: Secondary | ICD-10-CM | POA: Insufficient documentation

## 2016-11-04 DIAGNOSIS — Z79899 Other long term (current) drug therapy: Secondary | ICD-10-CM | POA: Insufficient documentation

## 2016-11-04 DIAGNOSIS — E785 Hyperlipidemia, unspecified: Secondary | ICD-10-CM | POA: Insufficient documentation

## 2016-11-04 DIAGNOSIS — Z87891 Personal history of nicotine dependence: Secondary | ICD-10-CM | POA: Diagnosis not present

## 2016-11-04 DIAGNOSIS — J441 Chronic obstructive pulmonary disease with (acute) exacerbation: Secondary | ICD-10-CM | POA: Diagnosis not present

## 2016-11-04 LAB — BASIC METABOLIC PANEL
Anion gap: 10 (ref 5–15)
BUN: 19 mg/dL (ref 6–20)
CALCIUM: 9.2 mg/dL (ref 8.9–10.3)
CO2: 27 mmol/L (ref 22–32)
Chloride: 103 mmol/L (ref 101–111)
Creatinine, Ser: 0.79 mg/dL (ref 0.44–1.00)
GFR calc Af Amer: >60 mL/min (ref >60)
GLUCOSE: 118 mg/dL — AB (ref 65–99)
Potassium: 4.3 mmol/L (ref 3.5–5.1)
SODIUM: 140 mmol/L (ref 135–145)

## 2016-11-04 LAB — CBC WITH DIFFERENTIAL/PLATELET
Basophils Absolute: 0.1 10*3/uL (ref 0.0–0.1)
Basophils Relative: 1 %
Eosinophils Absolute: 0.3 10*3/uL (ref 0.0–0.7)
Eosinophils Relative: 2 %
HEMATOCRIT: 41.8 % (ref 36.0–46.0)
Hemoglobin: 13.6 g/dL (ref 12.0–15.0)
LYMPHS ABS: 5.5 10*3/uL — AB (ref 0.7–4.0)
Lymphocytes Relative: 40 %
MCH: 31.3 pg (ref 26.0–34.0)
MCHC: 32.5 g/dL (ref 30.0–36.0)
MCV: 96.1 fL (ref 78.0–100.0)
MONOS PCT: 7 %
Monocytes Absolute: 1 10*3/uL (ref 0.1–1.0)
NEUTROS ABS: 6.8 10*3/uL (ref 1.7–7.7)
Neutrophils Relative %: 50 %
Platelets: 299 10*3/uL (ref 150–400)
RBC: 4.35 MIL/uL (ref 3.87–5.11)
RDW: 14.5 % (ref 11.5–15.5)
WBC: 13.7 10*3/uL — AB (ref 4.0–10.5)

## 2016-11-04 LAB — I-STAT TROPONIN, ED: Troponin i, poc: 0.02 ng/mL (ref 0.00–0.08)

## 2016-11-04 MED ORDER — ALBUTEROL SULFATE (2.5 MG/3ML) 0.083% IN NEBU
5.0000 mg | INHALATION_SOLUTION | Freq: Once | RESPIRATORY_TRACT | Status: AC
  Administered 2016-11-04: 5 mg via RESPIRATORY_TRACT
  Filled 2016-11-04: qty 6

## 2016-11-04 MED ORDER — AZITHROMYCIN 250 MG PO TABS: 250.0000 mg | ORAL_TABLET | Freq: Every day | ORAL | 0 refills | Status: DC

## 2016-11-04 MED ORDER — AZITHROMYCIN 250 MG PO TABS
500.0000 mg | ORAL_TABLET | Freq: Once | ORAL | Status: AC
  Administered 2016-11-04: 500 mg via ORAL
  Filled 2016-11-04: qty 2

## 2016-11-04 MED ORDER — IPRATROPIUM BROMIDE 0.02 % IN SOLN
0.5000 mg | Freq: Once | RESPIRATORY_TRACT | Status: AC
  Administered 2016-11-04: 0.5 mg via RESPIRATORY_TRACT
  Filled 2016-11-04: qty 2.5

## 2016-11-04 MED ORDER — ACETAMINOPHEN 325 MG PO TABS
650.0000 mg | ORAL_TABLET | Freq: Once | ORAL | Status: AC
  Administered 2016-11-04: 650 mg via ORAL
  Filled 2016-11-04: qty 2

## 2016-11-04 MED ORDER — DOXYCYCLINE HYCLATE 100 MG PO TABS: 100.0000 mg | ORAL_TABLET | Freq: Once | ORAL | Status: DC

## 2016-11-04 MED ORDER — PREDNISONE 10 MG (21) PO TBPK: ORAL_TABLET | Freq: Every day | ORAL | 0 refills | Status: DC

## 2016-11-04 NOTE — ED Provider Notes (Signed)
[facility] DEPT Provider Note   CSN: [id number] Arrival date & time: 11/04/16  6812     History   Chief Complaint Chief Complaint  Patient presents with  . Shortness of Breath    HPI Kim [name] is a 61 y.o. female.  The history is provided by the patient.  Shortness of Breath  This is a recurrent problem. The average episode lasts 12 weeks. The problem occurs continuously.The current episode started more than 1 week ago. The problem has been gradually worsening. Associated symptoms include cough, sputum production and wheezing. Pertinent negatives include no fever, no chest pain, no syncope, no vomiting, no abdominal pain, no leg pain and no leg swelling. It is unknown what precipitated the problem. Risk factors include smoking. She has tried ipratropium inhalers, beta-agonist inhalers, oral steroids and inhaled steroids for the symptoms. The treatment provided no relief. Associated medical issues include COPD.   61 year old female who presents with shortness of breath. She has a history of COPD, and has when necessary home oxygen. Reports 12 weeks of progressively worsening shortness of breath, acutely worse over the past few days. She has been followed by outpatient pulmonology and has finished 3 courses of steroid tapers, without improvement. States associating cough with green to yellowish thick sputum. No known fevers, leg swelling or leg pain, chest pain, syncope or near syncope. EMS was called today. On their arrival she received 125 mg of Solu-Medrol and 5 mg of albuterol. She does report some improvement in her breathing with albuterol.  Past Medical History:  Diagnosis Date  . C8 RADICULOPATHY 06/05/2009  . COPD 08/31/2007  . DEPRESSION 11/19/2006  . DYSLIPIDEMIA 03/12/2009  . MI (mitral incompetence)   . 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 OSTEOARTHRITIS 06/05/2009  . PARESTHESIA 08/24/2007  . TOBACCO ABUSE 01/25/2010    Patient Active Problem List   Diagnosis Date Noted    . Upper airway cough syndrome 09/11/2016  . Acute bronchitis with COPD ([place]) 08/28/2016  . Allergic rhinitis 08/10/2015  . Chronic respiratory failure ([place]) 07/05/2015  . Hypoxemia   . Acute coronary syndrome ([place])   . 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 Hyperglycemia   . HLD (hyperlipidemia)   . Coronary artery calcification seen on CAT scan 07/02/2015  . Acute respiratory distress 06/24/2015  . Solitary pulmonary nodule 12/02/2013  . Loss of weight 11/10/2013  . Osteoarthritis 06/05/2009  . C8 RADICULOPATHY 06/05/2009  . Dyslipidemia 03/12/2009  . 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 COPD GOLD III  08/31/2007  . PARESTHESIA 08/24/2007  . DEPRESSION 11/19/2006    Past Surgical History:  Procedure Laterality Date  . APPENDECTOMY  1969  . CARDIAC CATHETERIZATION N/A 07/05/2015   Procedure: Left Heart Cath and Coronary Angiography;  Surgeon: Leonie Man, MD;  Location: [facility];  Service: Cardiovascular;  Laterality: N/A;  . CHOLECYSTECTOMY  1989  . collapse lung  1990  . EXPLORATORY LAPAROTOMY  1969    OB History    No data available       Home Medications    Prior to Admission medications   Medication Sig Start Date End Date Taking? Authorizing Provider  albuterol (PROVENTIL) (2.5 MG/3ML) 0.083% nebulizer solution Take 2.5 mg by nebulization every 6 (six) hours as needed for wheezing or shortness of breath.   Yes [provider]  ALPRAZolam Duanne Moron) 0.5 MG tablet TAKE 1/2 TO 1 TABLET BY MOUTH 3 TIMES A DAY 07/29/16  Yes Marletta Lor, MD  aspirin  EC 81 MG EC tablet Take 1 tablet (81 mg total) by mouth daily. 07/08/15  Yes Nita Sells, MD  carvedilol (COREG) 3.125 MG tablet TAKE 1 TABLET BY MOUTH 2 TIMES DAILY WITH MEALS 08/13/16  Yes [name], MD  Cholecalciferol (VITAMIN D3) 1000 units CAPS Take 1 capsule (1,000 Units total) by mouth 2 (two) times daily. 08/13/15  Yes Isaiah Serge, NP   furosemide (LASIX) 40 MG tablet Take 40 mg by mouth daily as needed (for weight gain of 3 lbs).  08/31/15  Yes [provider]  HYDROcodone-acetaminophen (NORCO) 7.5-325 MG tablet Take 1 tablet by mouth every 4 (four) hours as needed for moderate pain. 10/13/16  Yes Marletta Lor, MD  lisinopril (PRINIVIL,ZESTRIL) 2.5 MG tablet Take 2.5 mg by mouth 2 (two) times daily. 10/03/16  Yes [provider]  nitroGLYCERIN (NITROSTAT) 0.4 MG SL tablet Place 1 tablet (0.4 mg total) under the tongue every 5 (five) minutes as needed for chest pain. 08/03/15  Yes Eileen Stanford, PA-C  omeprazole (PRILOSEC) 20 MG capsule Take 20 mg by mouth daily. 10/17/16  Yes [provider]  PAXIL CR 25 MG 24 hr tablet TAKE 1 TABLET BY MOUTH  EVERY MORNING 10/22/16  Yes Marletta Lor, MD  pravastatin (PRAVACHOL) 20 MG tablet take 1 tablet by mouth every evening 02/06/16  Yes [name], MD  SYMBICORT 160-4.5 MCG/ACT inhaler inhale 2 puffs by mouth twice a day 02/29/16  Yes Byrum, Rose Fillers, MD  Tiotropium Bromide Monohydrate (SPIRIVA RESPIMAT) 2.5 MCG/ACT AERS Inhale 2 puffs into the lungs daily. 09/10/16  Yes Tanda Rockers, MD  VENTOLIN HFA 108 (90 Base) MCG/ACT inhaler INHALE 2 PUFFS INTO THE LUNGS EVERY 4 HOURS AS NEEDED FOR WHEEZING OR SHORTNESS OF BREATH 02/06/16  Yes Byrum, Rose Fillers, MD  vitamin B-12 (CYANOCOBALAMIN) 500 MCG tablet Take 500 mcg by mouth 2 (two) times daily.   Yes [provider]  azithromycin (ZITHROMAX) 250 MG tablet Take 1 tablet (250 mg total) by mouth daily. Take first 2 tablets together, then 1 every day until finished. 11/05/16   Forde Dandy, MD  predniSONE (STERAPRED UNI-PAK 21 TAB) 10 MG (21) TBPK tablet Take by mouth daily. Take 6 tabs by mouth daily  for 2 days, then 5 tabs for 2 days, then 4 tabs for 2 days, then 3 tabs for 2 days, 2 tabs for 2 days, then 1 tab by mouth daily for 2 days 11/04/16   Forde Dandy, MD    Family  History Family History  Problem Relation Age of Onset  . Cancer Mother        lung  . Heart attack Mother   . Hypertension Sister   . Multiple sclerosis Brother   . Diabetes Sister   . Rheum arthritis Sister   . Thyroid disease Sister   . Multiple sclerosis Other   . Alcohol abuse Other   . Arthritis Other   . Diabetes Other   . Kidney disease Other   . Cancer Other        lung,ovarian,skin, uterine  . Stroke Other   . Heart disease Other   . Melanoma Other   . Heart attack Maternal Uncle   . Heart attack Other        NEICE  . Heart attack Other        NEPHEW  . Hypertension Brother   . Stroke Maternal Grandmother   . Colon cancer Neg Hx  Social History Social History  Substance Use Topics  . Smoking status: Former Smoker    Packs/day: 2.00    Years: 38.00    Types: Cigarettes    Quit date: 03/07/2014  . Smokeless tobacco: Never Used  . Alcohol use No     Allergies   Penicillins   Review of Systems Review of Systems  Constitutional: Negative for fever.  Respiratory: Positive for cough, sputum production, shortness of breath and wheezing.   Cardiovascular: Negative for chest pain, leg swelling and syncope.  Gastrointestinal: Negative for abdominal pain and vomiting.  All other systems reviewed and are negative.    Physical Exam Updated Vital Signs BP 117/64 (BP Location: Right Arm)   Pulse 75   Temp 98.6 F (37 C) (Oral)   Resp 20   Ht 5\' 3"  (1.6 m)   Wt 57.6 kg (127 lb)   SpO2 97%   BMI 22.50 kg/m   Physical Exam Physical Exam  Nursing note and vitals reviewed. Constitutional: Thin, chronically ill-appearing, non-toxic, and in no acute distress Head: Normocephalic and atraumatic.  Mouth/Throat: Oropharynx is clear and moist.  Neck: Normal range of motion. Neck supple.  Cardiovascular: Normal rate and regular rhythm.   Pulmonary/Chest: Effort normal. No conversational dyspnea. Poor air movement with faint expiratory wheezing in right  side lung fields. Abdominal: Soft. There is no tenderness. There is no rebound and no guarding.  Musculoskeletal: Normal range of motion.  Neurological: Alert, no facial droop, fluent speech, moves all extremities symmetrically Skin: Skin is warm and dry.  Psychiatric: Cooperative   ED Treatments / Results  Labs (all labs ordered are listed, but only abnormal results are displayed) Labs Reviewed  CBC WITH DIFFERENTIAL/PLATELET - Abnormal; Notable for the following:       Result Value   WBC 13.7 (*)    Lymphs Abs 5.5 (*)    All other components within normal limits  BASIC METABOLIC PANEL - Abnormal; Notable for the following:    Glucose, Bld 118 (*)    All other components within normal limits  I-STAT TROPONIN, ED    EKG  EKG Interpretation  Date/Time:  Tuesday November 04 2016 08:14:21 EDT Ventricular Rate:  81 PR Interval:    QRS Duration: 84 QT Interval:  376 QTC Calculation: 437 R Axis:   87 Text Interpretation:  Sinus rhythm Borderline right axis deviation Consider anterior infarct previous inferolateral ischemia on 08/02/15 ekg resolved  Confirmed by Brantley Stage [phone]) on 11/04/2016 8:27:35 AM       Radiology Dg Chest 2 View  Result Date: 11/04/2016 CLINICAL DATA:  COPD exacerbation.  Cough EXAM: CHEST  2 VIEW COMPARISON:  08/26/2016 FINDINGS: There is hyperinflation of the lungs compatible with COPD. Heart and mediastinal contours are within normal limits. No focal opacities or effusions. No acute bony abnormality. IMPRESSION: COPD.  No active disease. Electronically Signed   By: Rolm Baptise M.D.   On: 11/04/2016 08:51    Procedures Procedures (including critical care time)  Medications Ordered in ED Medications  azithromycin (ZITHROMAX) tablet 500 mg (not administered)  ipratropium (ATROVENT) nebulizer solution 0.5 mg (0.5 mg Nebulization Given 11/04/16 0909)  albuterol (PROVENTIL) (2.5 MG/3ML) 0.083% nebulizer solution 5 mg (5 mg Nebulization Given 11/04/16 0909)   acetaminophen (TYLENOL) tablet 650 mg (650 mg Oral Given 11/04/16 0909)     Initial Impression / Assessment and Plan / ED Course  I have reviewed the triage vital signs and the nursing notes.  Pertinent labs & imaging results  that were available during my care of the patient were reviewed by me and considered in my medical decision making (see chart for details).     Presenting with likely COPD exacerbation. Concern for failure of outpatient treatment.  Room air pulse ox is between 90 and 93%, and she is breathing comfortably with no conversational dyspnea. I feel that this is probably baseline for her for advanced COPD. There is faint expiratory wheezing at the lower lungs on the right side, and she does feel significantly improved after receiving 5 mg of albuterol through EMS. She is given an additional 12 appear in the emergency department, and feels significantly improved. I had received Solu-Medrol by EMS. Also started on azithromycin. Chest x-ray visualized and shows no acute cardiopulmonary processes such as edema or pneumonia. Blood work reassuring. Troponin 1 is negative and EKG is not ischemic. No chest pain, and she feels her symptoms are consistent with COPD exacerbation, I do not feel that this is an atypical ACS presentation.  Both ambulate steadily here in the emergency department with a pulse ox of 93% without oxygen. She does not feel winded or short of breath with ambulation. She states that she would like to continue treatment from home rather than come into the hospital for further treatment.  Patient to be discharged with repeat steroid taper and antibiotics. She is to continue neb treatments at home scheduled for the next few days. Strict return and follow-up instructions reviewed. She expressed understanding of all discharge instructions and felt comfortable with the plan of care.   Final Clinical Impressions(s) / ED Diagnoses   Final diagnoses:  Acute exacerbation of  chronic obstructive pulmonary disease (COPD) (HCC)    New Prescriptions New Prescriptions   AZITHROMYCIN (ZITHROMAX) 250 MG TABLET    Take 1 tablet (250 mg total) by mouth daily. Take first 2 tablets together, then 1 every day until finished.   PREDNISONE (STERAPRED UNI-PAK 21 TAB) 10 MG (21) TBPK TABLET    Take by mouth daily. Take 6 tabs by mouth daily  for 2 days, then 5 tabs for 2 days, then 4 tabs for 2 days, then 3 tabs for 2 days, 2 tabs for 2 days, then 1 tab by mouth daily for 2 days     Forde Dandy, MD 11/04/16 [phone]

## 2016-11-04 NOTE — ED Triage Notes (Signed)
Pt arrives from home via [facility] c/o SOB with productive cough, denies fevers, chills, CP.  EMS reports giving 125 mg solu-medrol, 5 mg albuterol.  Pt reports multiple course of steroids recently.

## 2016-11-04 NOTE — Discharge Instructions (Signed)
Continue to give yourself a breathing treatment every 3-4 hours scheduled for the next 2 days. Then use your albuterol nebulizer as needed every 4-6 hours. Take steroids and antibiotics as prescribed.   Return for worsening symptoms, including difficulty breathing, severe chest pain, passing out, or any other symptoms concerning to you.

## 2016-11-04 NOTE — ED Notes (Signed)
93 while ambulating

## 2016-11-04 NOTE — ED Notes (Signed)
EDP at bedside  

## 2016-11-04 NOTE — ED Notes (Signed)
IV removed and pt getting dressed waiting on nurse for discharge paperwork.

## 2016-11-04 NOTE — ED Notes (Signed)
Got patient undress on the monitor did ekg shown to er doctor 

## 2016-11-06 IMAGING — DX DG CHEST 2V
2 series · 2 of 2 positions shown · non-contrast
Comparison: July 06, 2015

CLINICAL DATA: Chest pain and abnormal increase chest pressure
today.

EXAM:
CHEST  2 VIEW

[chest pa]
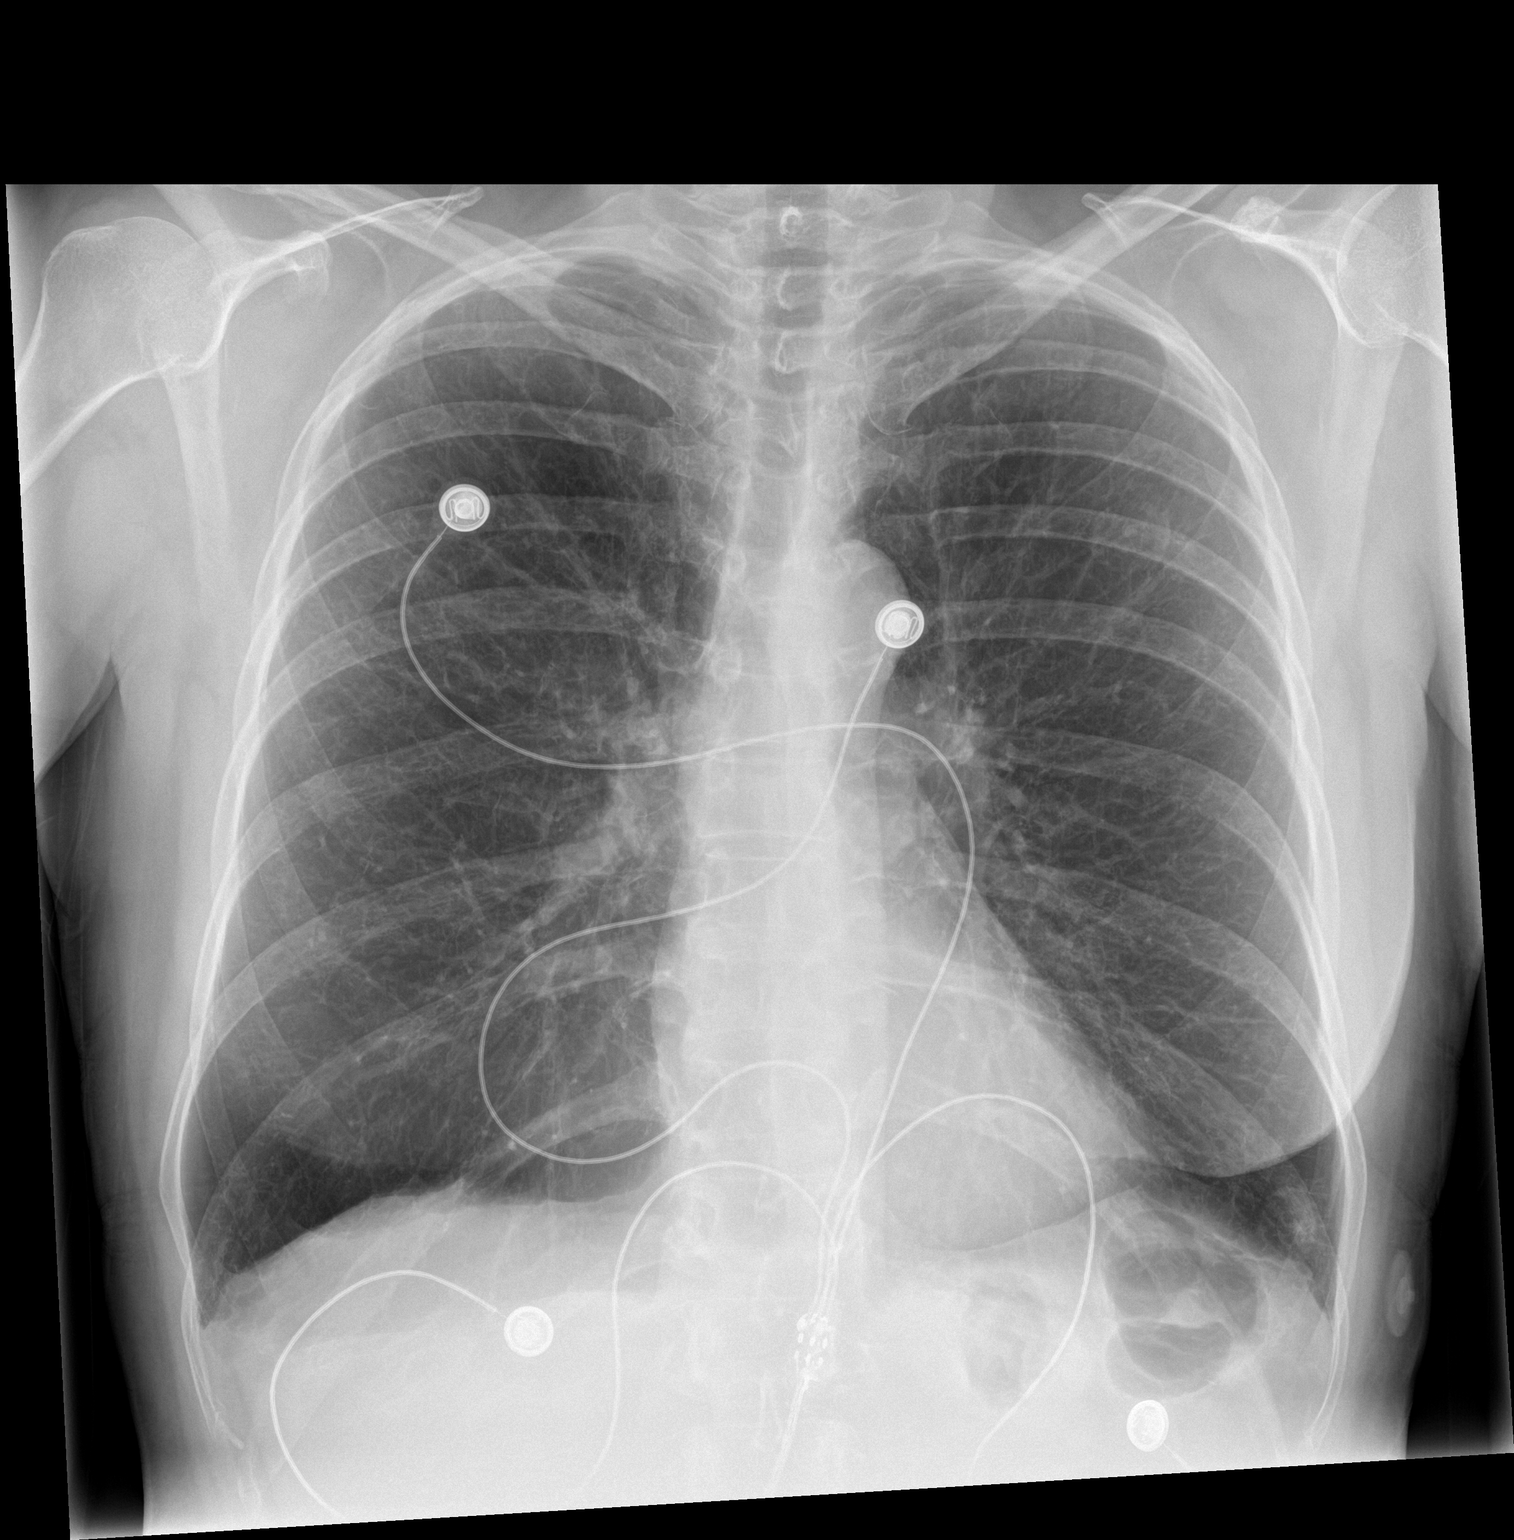

[chest lat]
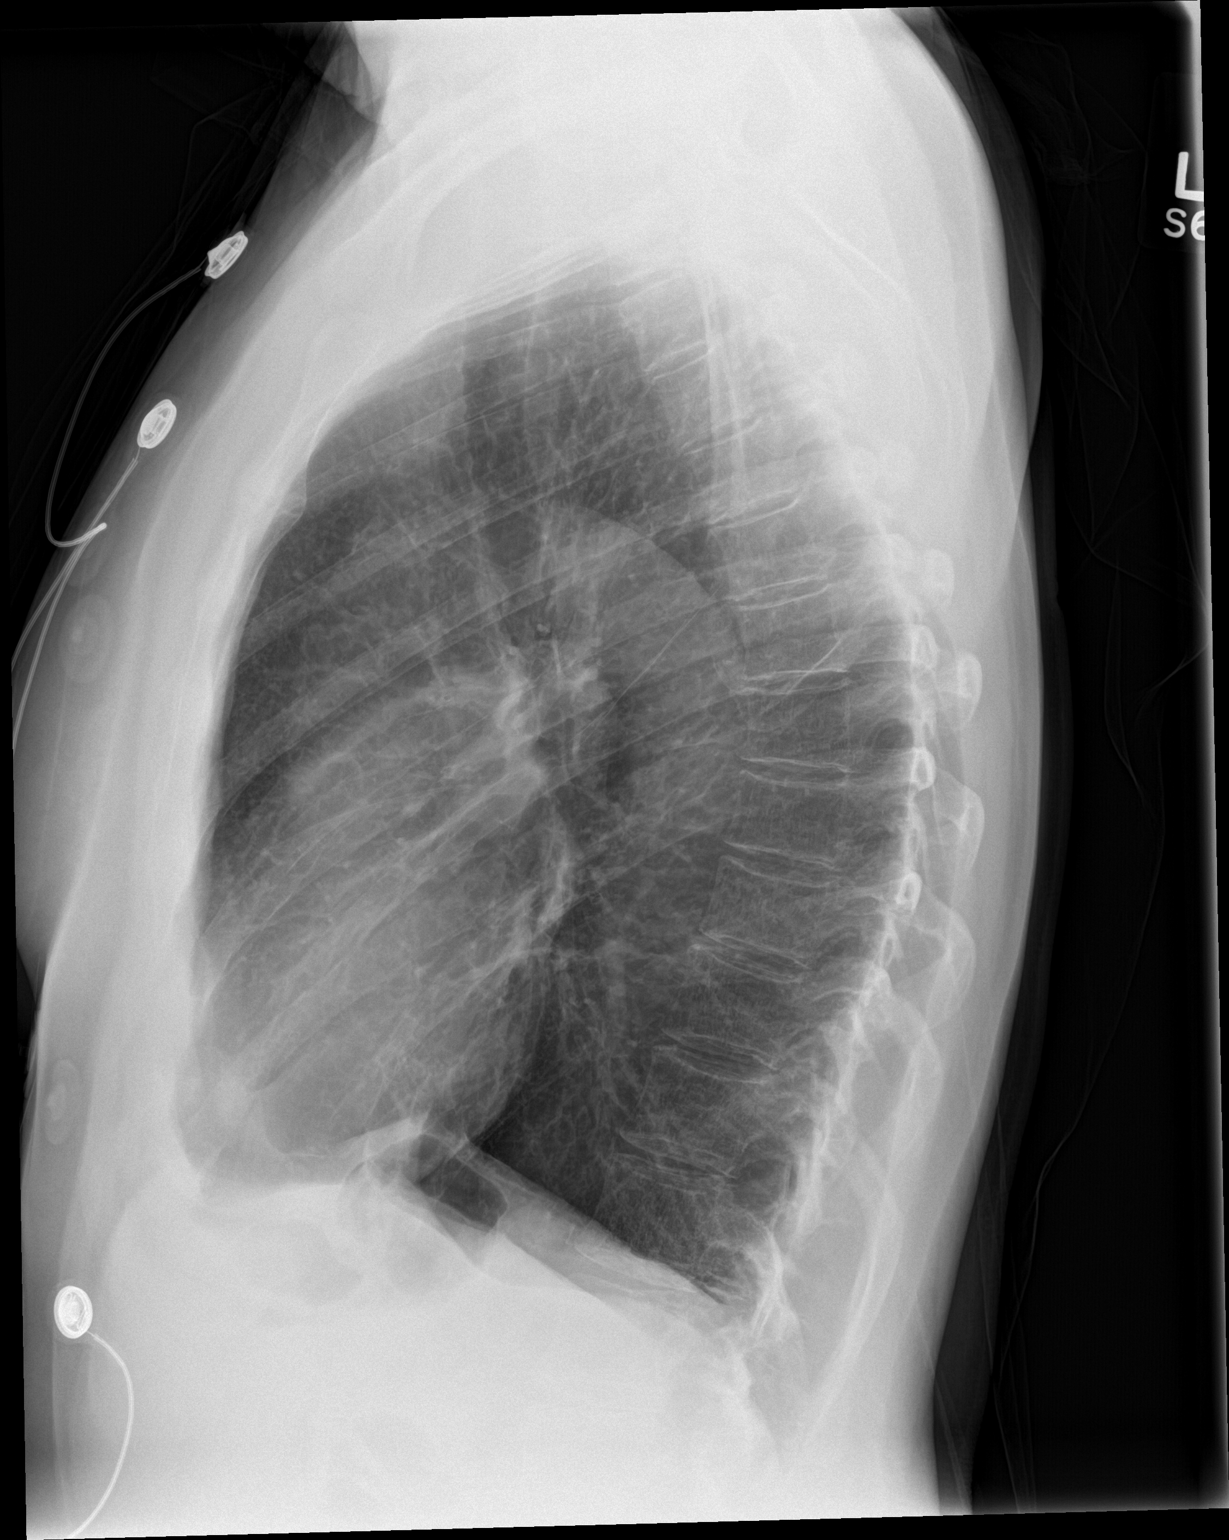

[2 of 2 positions shown; findings below may reference images not displayed]

FINDINGS: The heart size and mediastinal contours are within normal limits.
There is no focal infiltrate, pulmonary edema, or pleural effusion.
The visualized skeletal structures are unremarkable.
IMPRESSION: No active cardiopulmonary disease.

## 2016-11-20 ENCOUNTER — Other Ambulatory Visit: Payer: Self-pay | Admitting: Internal Medicine

## 2016-11-20 ENCOUNTER — Telehealth: Payer: Self-pay | Admitting: Internal Medicine

## 2016-11-20 MED ORDER — HYDROCODONE-ACETAMINOPHEN 7.5-325 MG PO TABS: 1.0000 | ORAL_TABLET | ORAL | 0 refills | Status: DC | PRN

## 2016-11-20 NOTE — Telephone Encounter (Signed)
Rx printed awaiting to be signed.  

## 2016-11-20 NOTE — Telephone Encounter (Signed)
Pt request refill  °HYDROcodone-acetaminophen (NORCO) 7.5-325 MG tablet °

## 2016-11-21 NOTE — Telephone Encounter (Signed)
Pt notified Rx ready for pickup. Rx printed and signed.  

## 2016-11-27 ENCOUNTER — Other Ambulatory Visit: Payer: Self-pay | Admitting: Acute Care

## 2016-11-27 DIAGNOSIS — Z87891 Personal history of nicotine dependence: Secondary | ICD-10-CM

## 2016-11-27 DIAGNOSIS — Z122 Encounter for screening for malignant neoplasm of respiratory organs: Secondary | ICD-10-CM

## 2016-12-01 ENCOUNTER — Inpatient Hospital Stay: Admission: RE | Admit: 2016-12-01 | Payer: 59 | Source: Ambulatory Visit

## 2016-12-03 ENCOUNTER — Encounter (HOSPITAL_COMMUNITY): Payer: Self-pay | Admitting: Emergency Medicine

## 2016-12-03 ENCOUNTER — Emergency Department (HOSPITAL_COMMUNITY): Payer: 59

## 2016-12-03 ENCOUNTER — Observation Stay (HOSPITAL_COMMUNITY)
Admission: EM | Admit: 2016-12-03 | Discharge: 2016-12-05 | Disposition: A | Discharge status: Home / Self Care | Payer: 59 | Attending: Internal Medicine | Admitting: Internal Medicine

## 2016-12-03 DIAGNOSIS — E785 Hyperlipidemia, unspecified: Secondary | ICD-10-CM | POA: Diagnosis present

## 2016-12-03 DIAGNOSIS — M5412 Radiculopathy, cervical region: Secondary | ICD-10-CM | POA: Diagnosis present

## 2016-12-03 DIAGNOSIS — R911 Solitary pulmonary nodule: Secondary | ICD-10-CM | POA: Diagnosis not present

## 2016-12-03 DIAGNOSIS — J9621 Acute and chronic respiratory failure with hypoxia: Principal | ICD-10-CM | POA: Diagnosis present

## 2016-12-03 DIAGNOSIS — I340 Nonrheumatic mitral (valve) insufficiency: Secondary | ICD-10-CM | POA: Diagnosis not present

## 2016-12-03 DIAGNOSIS — F329 Major depressive disorder, single episode, unspecified: Secondary | ICD-10-CM | POA: Insufficient documentation

## 2016-12-03 DIAGNOSIS — I252 Old myocardial infarction: Secondary | ICD-10-CM | POA: Insufficient documentation

## 2016-12-03 DIAGNOSIS — F419 Anxiety disorder, unspecified: Secondary | ICD-10-CM | POA: Diagnosis not present

## 2016-12-03 DIAGNOSIS — M1990 Unspecified osteoarthritis, unspecified site: Secondary | ICD-10-CM | POA: Diagnosis present

## 2016-12-03 DIAGNOSIS — Z87442 Personal history of urinary calculi: Secondary | ICD-10-CM | POA: Insufficient documentation

## 2016-12-03 DIAGNOSIS — R0603 Acute respiratory distress: Secondary | ICD-10-CM | POA: Diagnosis present

## 2016-12-03 DIAGNOSIS — J9610 Chronic respiratory failure, unspecified whether with hypoxia or hypercapnia: Secondary | ICD-10-CM | POA: Diagnosis present

## 2016-12-03 DIAGNOSIS — Z79899 Other long term (current) drug therapy: Secondary | ICD-10-CM | POA: Insufficient documentation

## 2016-12-03 DIAGNOSIS — J441 Chronic obstructive pulmonary disease with (acute) exacerbation: Secondary | ICD-10-CM | POA: Diagnosis not present

## 2016-12-03 DIAGNOSIS — Z880 Allergy status to penicillin: Secondary | ICD-10-CM | POA: Diagnosis not present

## 2016-12-03 DIAGNOSIS — I2510 Atherosclerotic heart disease of native coronary artery without angina pectoris: Secondary | ICD-10-CM | POA: Insufficient documentation

## 2016-12-03 DIAGNOSIS — Z9981 Dependence on supplemental oxygen: Secondary | ICD-10-CM | POA: Insufficient documentation

## 2016-12-03 DIAGNOSIS — I959 Hypotension, unspecified: Secondary | ICD-10-CM

## 2016-12-03 DIAGNOSIS — J9622 Acute and chronic respiratory failure with hypercapnia: Secondary | ICD-10-CM | POA: Insufficient documentation

## 2016-12-03 DIAGNOSIS — K219 Gastro-esophageal reflux disease without esophagitis: Secondary | ICD-10-CM | POA: Insufficient documentation

## 2016-12-03 DIAGNOSIS — I5043 Acute on chronic combined systolic (congestive) and diastolic (congestive) heart failure: Secondary | ICD-10-CM | POA: Diagnosis not present

## 2016-12-03 DIAGNOSIS — I951 Orthostatic hypotension: Secondary | ICD-10-CM | POA: Insufficient documentation

## 2016-12-03 DIAGNOSIS — J449 Chronic obstructive pulmonary disease, unspecified: Secondary | ICD-10-CM | POA: Diagnosis present

## 2016-12-03 DIAGNOSIS — I110 Hypertensive heart disease with heart failure: Secondary | ICD-10-CM | POA: Diagnosis not present

## 2016-12-03 DIAGNOSIS — Z87891 Personal history of nicotine dependence: Secondary | ICD-10-CM | POA: Diagnosis not present

## 2016-12-03 DIAGNOSIS — J4489 Other specified chronic obstructive pulmonary disease: Secondary | ICD-10-CM | POA: Diagnosis present

## 2016-12-03 DIAGNOSIS — Z7982 Long term (current) use of aspirin: Secondary | ICD-10-CM | POA: Insufficient documentation

## 2016-12-03 LAB — BASIC METABOLIC PANEL
ANION GAP: 9 (ref 5–15)
BUN: 19 mg/dL (ref 6–20)
CALCIUM: 9.4 mg/dL (ref 8.9–10.3)
CO2: 30 mmol/L (ref 22–32)
Chloride: 104 mmol/L (ref 101–111)
Creatinine, Ser: 0.96 mg/dL (ref 0.44–1.00)
GFR calc Af Amer: >60 mL/min (ref >60)
GLUCOSE: 146 mg/dL — AB (ref 65–99)
Potassium: 4.4 mmol/L (ref 3.5–5.1)
Sodium: 143 mmol/L (ref 135–145)

## 2016-12-03 LAB — RESPIRATORY PANEL BY PCR
ADENOVIRUS-RVPPCR: NOT DETECTED
Bordetella pertussis: NOT DETECTED
CHLAMYDOPHILA PNEUMONIAE-RVPPCR: NOT DETECTED
CORONAVIRUS NL63-RVPPCR: NOT DETECTED
CORONAVIRUS OC43-RVPPCR: NOT DETECTED
Coronavirus 229E: NOT DETECTED
Coronavirus HKU1: NOT DETECTED
INFLUENZA A-RVPPCR: NOT DETECTED
INFLUENZA B-RVPPCR: NOT DETECTED
Metapneumovirus: NOT DETECTED
Mycoplasma pneumoniae: NOT DETECTED
PARAINFLUENZA VIRUS 1-RVPPCR: NOT DETECTED
PARAINFLUENZA VIRUS 3-RVPPCR: NOT DETECTED
PARAINFLUENZA VIRUS 4-RVPPCR: NOT DETECTED
Parainfluenza Virus 2: NOT DETECTED
RESPIRATORY SYNCYTIAL VIRUS-RVPPCR: NOT DETECTED
RHINOVIRUS / ENTEROVIRUS - RVPPCR: NOT DETECTED

## 2016-12-03 LAB — PROCALCITONIN: Procalcitonin: <0.1 ng/mL

## 2016-12-03 LAB — I-STAT ARTERIAL BLOOD GAS, ED
Acid-Base Excess: 3 mmol/L — ABNORMAL HIGH (ref 0.0–2.0)
BICARBONATE: 30.6 mmol/L — AB (ref 20.0–28.0)
O2 SAT: 100 %
TCO2: 32 mmol/L (ref 22–32)
pCO2 arterial: 59.4 mmHg — ABNORMAL HIGH (ref 32.0–48.0)
pH, Arterial: 7.317 — ABNORMAL LOW (ref 7.350–7.450)
pO2, Arterial: 248 mmHg — ABNORMAL HIGH (ref 83.0–108.0)

## 2016-12-03 LAB — CBC WITH DIFFERENTIAL/PLATELET
BASOS PCT: 1 %
Basophils Absolute: 0.1 10*3/uL (ref 0.0–0.1)
EOS PCT: 2 %
Eosinophils Absolute: 0.3 10*3/uL (ref 0.0–0.7)
HEMATOCRIT: 44.6 % (ref 36.0–46.0)
Hemoglobin: 14.2 g/dL (ref 12.0–15.0)
LYMPHS ABS: 8.5 10*3/uL — AB (ref 0.7–4.0)
Lymphocytes Relative: 63 %
MCH: 30.9 pg (ref 26.0–34.0)
MCHC: 31.8 g/dL (ref 30.0–36.0)
MCV: 97 fL (ref 78.0–100.0)
MONO ABS: 1 10*3/uL (ref 0.1–1.0)
MONOS PCT: 7 %
NEUTROS ABS: 3.7 10*3/uL (ref 1.7–7.7)
Neutrophils Relative %: 27 %
PLATELETS: 278 10*3/uL (ref 150–400)
RBC: 4.6 MIL/uL (ref 3.87–5.11)
RDW: 14 % (ref 11.5–15.5)
WBC: 13.6 10*3/uL — AB (ref 4.0–10.5)

## 2016-12-03 LAB — MRSA PCR SCREENING: MRSA by PCR: NEGATIVE

## 2016-12-03 LAB — LACTIC ACID, PLASMA
LACTIC ACID, VENOUS: 1.8 mmol/L (ref 0.5–1.9)
LACTIC ACID, VENOUS: 1.9 mmol/L (ref 0.5–1.9)

## 2016-12-03 LAB — I-STAT TROPONIN, ED: Troponin i, poc: 0.01 ng/mL (ref 0.00–0.08)

## 2016-12-03 LAB — PATHOLOGIST SMEAR REVIEW

## 2016-12-03 MED ORDER — CARVEDILOL 3.125 MG PO TABS
3.1250 mg | ORAL_TABLET | Freq: Two times a day (BID) | ORAL | Status: DC
  Administered 2016-12-04 – 2016-12-05 (×2): 3.125 mg via ORAL
  Filled 2016-12-03 (×4): qty 1

## 2016-12-03 MED ORDER — ALBUTEROL (5 MG/ML) CONTINUOUS INHALATION SOLN
INHALATION_SOLUTION | RESPIRATORY_TRACT | Status: AC
  Filled 2016-12-03: qty 20

## 2016-12-03 MED ORDER — ONDANSETRON HCL 4 MG PO TABS: 4.0000 mg | ORAL_TABLET | Freq: Four times a day (QID) | ORAL | Status: DC | PRN

## 2016-12-03 MED ORDER — ENOXAPARIN SODIUM 40 MG/0.4ML ~~LOC~~ SOLN
40.0000 mg | SUBCUTANEOUS | Status: DC
  Administered 2016-12-03 – 2016-12-04 (×2): 40 mg via SUBCUTANEOUS
  Filled 2016-12-03 (×2): qty 0.4

## 2016-12-03 MED ORDER — MAGNESIUM SULFATE 2 GM/50ML IV SOLN
2.0000 g | Freq: Once | INTRAVENOUS | Status: AC
  Administered 2016-12-03: 2 g via INTRAVENOUS
  Filled 2016-12-03: qty 50

## 2016-12-03 MED ORDER — METHYLPREDNISOLONE SODIUM SUCC 125 MG IJ SOLR
40.0000 mg | Freq: Three times a day (TID) | INTRAMUSCULAR | Status: AC
  Administered 2016-12-03 – 2016-12-04 (×3): 40 mg via INTRAVENOUS
  Filled 2016-12-03 (×3): qty 2

## 2016-12-03 MED ORDER — ALPRAZOLAM 0.25 MG PO TABS
0.2500 mg | ORAL_TABLET | Freq: Three times a day (TID) | ORAL | Status: DC | PRN
  Administered 2016-12-03 – 2016-12-04 (×3): 0.25 mg via ORAL
  Filled 2016-12-03 (×3): qty 1

## 2016-12-03 MED ORDER — PRAVASTATIN SODIUM 40 MG PO TABS
20.0000 mg | ORAL_TABLET | Freq: Every evening | ORAL | Status: DC
  Administered 2016-12-03 – 2016-12-04 (×2): 20 mg via ORAL
  Filled 2016-12-03 (×2): qty 1

## 2016-12-03 MED ORDER — ASPIRIN EC 81 MG PO TBEC
81.0000 mg | DELAYED_RELEASE_TABLET | Freq: Every day | ORAL | Status: DC
  Administered 2016-12-03 – 2016-12-05 (×3): 81 mg via ORAL
  Filled 2016-12-03 (×3): qty 1

## 2016-12-03 MED ORDER — LEVOFLOXACIN IN D5W 500 MG/100ML IV SOLN
500.0000 mg | INTRAVENOUS | Status: DC
  Administered 2016-12-03 – 2016-12-04 (×2): 500 mg via INTRAVENOUS
  Filled 2016-12-03 (×2): qty 100

## 2016-12-03 MED ORDER — PAROXETINE HCL ER 25 MG PO TB24
25.0000 mg | ORAL_TABLET | Freq: Every morning | ORAL | Status: DC
  Administered 2016-12-03 – 2016-12-05 (×3): 25 mg via ORAL
  Filled 2016-12-03 (×4): qty 1

## 2016-12-03 MED ORDER — BISACODYL 10 MG RE SUPP
10.0000 mg | Freq: Every day | RECTAL | Status: DC | PRN
Start: 2016-12-03 — End: 2016-12-05

## 2016-12-03 MED ORDER — IPRATROPIUM BROMIDE 0.02 % IN SOLN
1.0000 mg | Freq: Once | RESPIRATORY_TRACT | Status: AC
  Administered 2016-12-03: 1 mg via RESPIRATORY_TRACT

## 2016-12-03 MED ORDER — FUROSEMIDE 20 MG PO TABS: 40.0000 mg | ORAL_TABLET | Freq: Every day | ORAL | Status: DC | PRN

## 2016-12-03 MED ORDER — IPRATROPIUM BROMIDE 0.02 % IN SOLN
RESPIRATORY_TRACT | Status: AC
  Administered 2016-12-03: 1 mg via RESPIRATORY_TRACT
  Filled 2016-12-03: qty 2.5

## 2016-12-03 MED ORDER — SODIUM CHLORIDE 0.9 % IV SOLN
INTRAVENOUS | Status: DC
  Administered 2016-12-03 – 2016-12-04 (×2): via INTRAVENOUS

## 2016-12-03 MED ORDER — PNEUMOCOCCAL VAC POLYVALENT 25 MCG/0.5ML IJ INJ: 0.5000 mL | INJECTION | INTRAMUSCULAR | Status: DC | PRN

## 2016-12-03 MED ORDER — ONDANSETRON HCL 4 MG/2ML IJ SOLN: 4.0000 mg | Freq: Four times a day (QID) | INTRAMUSCULAR | Status: DC | PRN

## 2016-12-03 MED ORDER — VITAMIN B-12 1000 MCG PO TABS
500.0000 ug | ORAL_TABLET | Freq: Two times a day (BID) | ORAL | Status: DC
  Administered 2016-12-03 – 2016-12-05 (×4): 500 ug via ORAL
  Filled 2016-12-03 (×5): qty 1

## 2016-12-03 MED ORDER — IPRATROPIUM-ALBUTEROL 0.5-2.5 (3) MG/3ML IN SOLN
3.0000 mL | RESPIRATORY_TRACT | Status: DC
  Administered 2016-12-03 (×3): 3 mL via RESPIRATORY_TRACT
  Filled 2016-12-03 (×3): qty 3

## 2016-12-03 MED ORDER — LISINOPRIL 2.5 MG PO TABS: 2.5000 mg | ORAL_TABLET | Freq: Two times a day (BID) | ORAL | Status: DC

## 2016-12-03 MED ORDER — ACETAMINOPHEN 650 MG RE SUPP: 650.0000 mg | Freq: Four times a day (QID) | RECTAL | Status: DC | PRN

## 2016-12-03 MED ORDER — ASPIRIN 81 MG PO TBEC: 81.0000 mg | DELAYED_RELEASE_TABLET | Freq: Every day | ORAL | Status: DC

## 2016-12-03 MED ORDER — PANTOPRAZOLE SODIUM 40 MG PO TBEC
40.0000 mg | DELAYED_RELEASE_TABLET | Freq: Every day | ORAL | Status: DC
  Administered 2016-12-03 – 2016-12-05 (×3): 40 mg via ORAL
  Filled 2016-12-03 (×3): qty 1

## 2016-12-03 MED ORDER — SODIUM CHLORIDE 0.9 % IV BOLUS (SEPSIS)
1000.0000 mL | Freq: Once | INTRAVENOUS | Status: AC
  Administered 2016-12-03: 1000 mL via INTRAVENOUS

## 2016-12-03 MED ORDER — VITAMIN B-12 500 MCG PO TABS: 500.0000 ug | ORAL_TABLET | Freq: Two times a day (BID) | ORAL | Status: DC

## 2016-12-03 MED ORDER — ACETAMINOPHEN 325 MG PO TABS: 650.0000 mg | ORAL_TABLET | Freq: Four times a day (QID) | ORAL | Status: DC | PRN

## 2016-12-03 MED ORDER — ALBUTEROL (5 MG/ML) CONTINUOUS INHALATION SOLN
15.0000 mg/h | INHALATION_SOLUTION | RESPIRATORY_TRACT | Status: DC
  Administered 2016-12-03: 15 mg/h via RESPIRATORY_TRACT

## 2016-12-03 MED ORDER — GUAIFENESIN ER 600 MG PO TB12: 600.0000 mg | ORAL_TABLET | Freq: Two times a day (BID) | ORAL | Status: DC | PRN

## 2016-12-03 MED ORDER — SENNOSIDES-DOCUSATE SODIUM 8.6-50 MG PO TABS: 1.0000 | ORAL_TABLET | Freq: Every evening | ORAL | Status: DC | PRN

## 2016-12-03 MED ORDER — ALBUTEROL SULFATE (2.5 MG/3ML) 0.083% IN NEBU: 2.5000 mg | INHALATION_SOLUTION | RESPIRATORY_TRACT | Status: DC | PRN

## 2016-12-03 NOTE — Progress Notes (Signed)
Pharmacy Antibiotic Note  Kim [name] is a 61 y.o. female admitted on 12/03/2016 with COPD exacerbation.  Pharmacy has been consulted for levaquin dosing. Pt is afebrile but WBC is elevated at 13.6. SCr is WNL and lactic acid is 1.9.   Plan: Levaquin 500mg  IV Q24H F/u renal fxn, C&S, clinical status and LOT  Height: 5\' 3"  (160 cm) Weight: 130 lb (59 kg) IBW/kg (Calculated) : 52.4  Temp (24hrs), Avg:97.9 F (36.6 C), Min:97.9 F (36.6 C), Max:97.9 F (36.6 C)   Recent Labs Lab 12/03/16 0545 12/03/16 0834  WBC 13.6*  --   CREATININE 0.96  --   LATICACIDVEN  --  1.9    Estimated Creatinine Clearance: 50.3 mL/min (by C-G formula based on SCr of 0.96 mg/dL).    Allergies  Allergen Reactions  . Penicillins Hives    Has patient had a PCN reaction causing immediate rash, facial/tongue/throat swelling, SOB or lightheadedness with hypotension: Yes Has patient had a PCN reaction causing severe rash involving mucus membranes or skin necrosis: No Has patient had a PCN reaction that required hospitalization No Has patient had a PCN reaction occurring within the last 10 years: No If all of the above answers are "NO", then may proceed with Cephalosporin use.     Antimicrobials this admission: Levaquin 8/29>>  Dose adjustments this admission: N/A  Microbiology results: Pending  Thank you for allowing pharmacy to be a part of this patient's care.  Kolby Myung, Rande Lawman 12/03/2016 9:59 AM

## 2016-12-03 NOTE — ED Triage Notes (Signed)
BIB EMS from home, called out for SOB sudden onset. Pt was hypoxic, tachypneic, diaphoretic en route. Pt was started on CPAP. Upon arrival diminished lung sounds. Received 15 albuterol, 1 atrovent, 125 solumedrol, 4zofran ODT. EDP and RT at bedside, placed pt on 3L [place] by RT per request of Dr. Dina Rich.

## 2016-12-03 NOTE — ED Notes (Signed)
Dr. Marily Memos, admitting, at bedside.

## 2016-12-03 NOTE — ED Notes (Signed)
Admitting team made aware of pt's BP-- orders received

## 2016-12-03 NOTE — ED Provider Notes (Addendum)
[facility] DEPT Provider Note   CSN: [id number] Arrival date & time: 12/03/16  0522     History   Chief Complaint Chief Complaint  Patient presents with  . Respiratory Distress    HPI Kim [name] is a 61 y.o. female.  HPI  This is a 61 year old female with a history of COPD who presents with shortness of breath. Reports worsening shortness of breath over the last several days. No fever. Does report cough.denies chest pain. She is placed on CPAP By EMS.  He was given albuterol, steroids, Zofran. Patient reports that she already feels much better. CPAP was removed upon arrival. Patient denies any chest pain or leg swelling.  No recent hospitalizations for COPD.  Past Medical History:  Diagnosis Date  . C8 RADICULOPATHY 06/05/2009  . COPD 08/31/2007  . DEPRESSION 11/19/2006  . DYSLIPIDEMIA 03/12/2009  . MI (mitral incompetence)   . 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 OSTEOARTHRITIS 06/05/2009  . PARESTHESIA 08/24/2007  . TOBACCO ABUSE 01/25/2010    Patient Active Problem List   Diagnosis Date Noted  . Upper airway cough syndrome 09/11/2016  . Acute bronchitis with COPD ([place]) 08/28/2016  . Allergic rhinitis 08/10/2015  . Chronic respiratory failure ([place]) 07/05/2015  . Hypoxemia   . Acute coronary syndrome ([place])   . 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 Hyperglycemia   . HLD (hyperlipidemia)   . Coronary artery calcification seen on CAT scan 07/02/2015  . Acute respiratory distress 06/24/2015  . Solitary pulmonary nodule 12/02/2013  . Loss of weight 11/10/2013  . Osteoarthritis 06/05/2009  . C8 RADICULOPATHY 06/05/2009  . Dyslipidemia 03/12/2009  . 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 COPD GOLD III  08/31/2007  . PARESTHESIA 08/24/2007  . DEPRESSION 11/19/2006    Past Surgical History:  Procedure Laterality Date  . APPENDECTOMY  1969  . CARDIAC CATHETERIZATION N/A 07/05/2015   Procedure: Left Heart Cath and Coronary Angiography;  Surgeon:  Leonie Man, MD;  Location: [facility];  Service: Cardiovascular;  Laterality: N/A;  . CHOLECYSTECTOMY  1989  . collapse lung  1990  . EXPLORATORY LAPAROTOMY  1969    OB History    No data available       Home Medications    Prior to Admission medications   Medication Sig Start Date End Date Taking? Authorizing Provider  albuterol (PROVENTIL) (2.5 MG/3ML) 0.083% nebulizer solution Take 2.5 mg by nebulization every 6 (six) hours as needed for wheezing or shortness of breath.   Yes [provider]  ALPRAZolam Duanne Moron) 0.5 MG tablet TAKE 1/2 TO 1 TABLET BY MOUTH 3 TIMES A DAY 11/21/16  Yes Marletta Lor, MD  aspirin EC 81 MG EC tablet Take 1 tablet (81 mg total) by mouth daily. 07/08/15  Yes Nita Sells, MD  carvedilol (COREG) 3.125 MG tablet TAKE 1 TABLET BY MOUTH 2 TIMES DAILY WITH MEALS 08/13/16  Yes [name], MD  Cholecalciferol (VITAMIN D3) 1000 units CAPS Take 1 capsule (1,000 Units total) by mouth 2 (two) times daily. 08/13/15  Yes Isaiah Serge, NP  furosemide (LASIX) 40 MG tablet Take 40 mg by mouth daily as needed (for weight gain of 3 lbs).  08/31/15  Yes [provider]  lisinopril (PRINIVIL,ZESTRIL) 2.5 MG tablet Take 2.5 mg by mouth 2 (two) times daily. 10/03/16  Yes [provider]  nitroGLYCERIN (NITROSTAT) 0.4 MG SL tablet Place 1 tablet (0.4 mg total) under the tongue every 5 (five) minutes as needed  for chest pain. 08/03/15  Yes Eileen Stanford, PA-C  omeprazole (PRILOSEC) 20 MG capsule Take 20 mg by mouth daily. 10/17/16  Yes [provider]  PAXIL CR 25 MG 24 hr tablet TAKE 1 TABLET BY MOUTH  EVERY MORNING 10/22/16  Yes Marletta Lor, MD  pravastatin (PRAVACHOL) 20 MG tablet take 1 tablet by mouth every evening 02/06/16  Yes [name], MD  SYMBICORT 160-4.5 MCG/ACT inhaler inhale 2 puffs by mouth twice a day 02/29/16  Yes Byrum, Rose Fillers, MD  Tiotropium Bromide Monohydrate  (SPIRIVA RESPIMAT) 2.5 MCG/ACT AERS Inhale 2 puffs into the lungs daily. 09/10/16  Yes Tanda Rockers, MD  VENTOLIN HFA 108 (90 Base) MCG/ACT inhaler INHALE 2 PUFFS INTO THE LUNGS EVERY 4 HOURS AS NEEDED FOR WHEEZING OR SHORTNESS OF BREATH 02/06/16  Yes Byrum, Rose Fillers, MD  vitamin B-12 (CYANOCOBALAMIN) 500 MCG tablet Take 500 mcg by mouth 2 (two) times daily.   Yes [provider]  azithromycin (ZITHROMAX) 250 MG tablet Take 1 tablet (250 mg total) by mouth daily. Take first 2 tablets together, then 1 every day until finished. Patient not taking: Reported on 12/03/2016 11/05/16   Forde Dandy, MD  HYDROcodone-acetaminophen [facility]) 7.5-325 MG tablet Take 1 tablet by mouth every 4 (four) hours as needed for moderate pain. Patient not taking: Reported on 12/03/2016 11/20/16   Marletta Lor, MD  predniSONE (STERAPRED UNI-PAK 21 TAB) 10 MG (21) TBPK tablet Take by mouth daily. Take 6 tabs by mouth daily  for 2 days, then 5 tabs for 2 days, then 4 tabs for 2 days, then 3 tabs for 2 days, 2 tabs for 2 days, then 1 tab by mouth daily for 2 days Patient not taking: Reported on 12/03/2016 11/04/16   Forde Dandy, MD    Family History Family History  Problem Relation Age of Onset  . Cancer Mother        lung  . Heart attack Mother   . Hypertension Sister   . Multiple sclerosis Brother   . Diabetes Sister   . Rheum arthritis Sister   . Thyroid disease Sister   . Multiple sclerosis Other   . Alcohol abuse Other   . Arthritis Other   . Diabetes Other   . Kidney disease Other   . Cancer Other        lung,ovarian,skin, uterine  . Stroke Other   . Heart disease Other   . Melanoma Other   . Heart attack Maternal Uncle   . Heart attack Other        NEICE  . Heart attack Other        NEPHEW  . Hypertension Brother   . Stroke Maternal Grandmother   . Colon cancer Neg Hx     Social History Social History  Substance Use Topics  . Smoking status: Former Smoker    Packs/day: 2.00     Years: 38.00    Types: Cigarettes    Quit date: 03/07/2014  . Smokeless tobacco: Never Used  . Alcohol use No     Allergies   Penicillins   Review of Systems Review of Systems  Constitutional: Negative for fever.  Respiratory: Positive for cough and shortness of breath.   Cardiovascular: Negative for chest pain and leg swelling.  Gastrointestinal: Negative for abdominal pain, nausea and vomiting.  All other systems reviewed and are negative.    Physical Exam Updated Vital Signs BP 110/76   Pulse 90  Temp 97.9 F (36.6 C) (Oral)   Resp (!) 22   Ht 5\' 3"  (1.6 m)   Wt 59 kg (130 lb)   SpO2 100%   BMI 23.03 kg/m   Physical Exam  Constitutional: She is oriented to person, place, and time. No distress.  Ill-appearing but nontoxic diaphoretic  HENT:  Head: Normocephalic and atraumatic.  Cardiovascular: Regular rhythm and normal heart sounds.   tachycardia  Pulmonary/Chest: Effort normal. No respiratory distress. She has wheezes.  Diminished breath sounds in all lung fields, tight, expiratory squeaks  Abdominal: Soft. Bowel sounds are normal. There is no tenderness.  Musculoskeletal: She exhibits no edema.  Neurological: She is alert and oriented to person, place, and time.  Skin: Skin is warm and dry.  Psychiatric: She has a normal mood and affect.  Nursing note and vitals reviewed.    ED Treatments / Results  Labs (all labs ordered are listed, but only abnormal results are displayed) Labs Reviewed  CBC WITH DIFFERENTIAL/PLATELET - Abnormal; Notable for the following:       Result Value   WBC 13.6 (*)    All other components within normal limits  BASIC METABOLIC PANEL - Abnormal; Notable for the following:    Glucose, Bld 146 (*)    All other components within normal limits  I-STAT ARTERIAL BLOOD GAS, ED - Abnormal; Notable for the following:    pH, Arterial 7.317 (*)    pCO2 arterial 59.4 (*)    pO2, Arterial 248.0 (*)    Bicarbonate 30.6 (*)     Acid-Base Excess 3.0 (*)    All other components within normal limits  I-STAT TROPONIN, ED    EKG  EKG Interpretation  Date/Time:  Wednesday December 03 2016 05:33:07 EDT Ventricular Rate:  84 PR Interval:    QRS Duration: 104 QT Interval:  384 QTC Calculation: 440 R Axis:   -83 Text Interpretation:  Sinus rhythm Abnormal R-wave progression, early transition Inferior infarct, acute (LCx) Consider anterior infarct QRS widening when compared to prior Confirmed by [place] Jew [phone]) on 12/03/2016 7:05:45 AM       Radiology Dg Chest Portable 1 View  Result Date: 12/03/2016 CLINICAL DATA:  Sudden onset dyspnea tonight. Tachypnea and diaphoresis. EXAM: PORTABLE CHEST 1 VIEW COMPARISON:  11/04/2016 FINDINGS: Hyperinflation. Mild interstitial coarsening, likely chronic. No airspace consolidation. No pneumothorax. No effusion. Normal heart size. Normal pulmonary vasculature. Normal hilar and mediastinal contours. IMPRESSION: Hyperinflation.  No consolidation or effusion. Electronically Signed   By: Andreas Newport M.D.   On: 12/03/2016 06:10    Procedures Procedures (including critical care time)  CRITICAL CARE Performed by: Merryl Hacker   Total critical care time: 25 minutes  Critical care time was exclusive of separately billable procedures and treating other patients.  Critical care was necessary to treat or prevent imminent or life-threatening deterioration.  Critical care was time spent personally by me on the following activities: development of treatment plan with patient and/or surrogate as well as nursing, discussions with consultants, evaluation of patient's response to treatment, examination of patient, obtaining history from patient or surrogate, ordering and performing treatments and interventions, ordering and review of laboratory studies, ordering and review of radiographic studies, pulse oximetry and re-evaluation of patient's condition.   Medications  Ordered in ED Medications  albuterol (PROVENTIL,VENTOLIN) solution continuous neb (0 mg/hr Nebulization Stopped 12/03/16 0650)  ipratropium (ATROVENT) nebulizer solution 1 mg (1 mg Nebulization Given 12/03/16 0539)     Initial Impression / Assessment and Plan /  ED Course  I have reviewed the triage vital signs and the nursing notes.  Pertinent labs & imaging results that were available during my care of the patient were reviewed by me and considered in my medical decision making (see chart for details).     Patient presents with shortness of breath. Tight on exam but speaking and able to provide history.  CPAP was removed. Patient stable on O2. Continuous nebulization ordered.  Hypercarbic respiratory acidosis noted. Oxygenating well. Mild leukocytosis. No pneumonia on chest x-ray. Suspect COPD exacerbation. On recheck, she continues to have diminished breath sounds in all lung fields; however, she reports improvement. Discussed with patient that she will likely need admission. However, will finish nebulization and have her reevaluated by oncoming provider.  Final Clinical Impressions(s) / ED Diagnoses   Final diagnoses:  COPD exacerbation [facility])    New Prescriptions New Prescriptions   No medications on file     Merryl Hacker, MD 12/03/16 0211    Merryl Hacker, MD 12/03/16 [phone]

## 2016-12-03 NOTE — ED Notes (Signed)
C[facility] called this nurse for report-- will wait for results of lactic acid and after bolus before calling report.

## 2016-12-03 NOTE — ED Provider Notes (Signed)
Assumed care from Dr. Dina Rich at shift change.  See her note for full H&P.  Briefly, 61 y.o. F here with SOB.  Initially on CPAP but transitioned to CAT.  Given solu-medrol.  Labs and CXR thus far reassuring.  ABG with some chronic CO2 retention.    Plan:  Currently finishing CAT.  Will reassess.  May need admission.  Results for orders placed or performed during the hospital encounter of 12/03/16  CBC with Differential  Result Value Ref Range   WBC 13.6 (H) 4.0 - 10.5 K/uL   RBC 4.60 3.87 - 5.11 MIL/uL   Hemoglobin 14.2 12.0 - 15.0 g/dL   HCT 44.6 36.0 - 46.0 %   MCV 97.0 78.0 - 100.0 fL   MCH 30.9 26.0 - 34.0 pg   MCHC 31.8 30.0 - 36.0 g/dL   RDW 14.0 11.5 - 15.5 %   Platelets 278 150 - 400 K/uL   Neutrophils Relative % PENDING %   Neutro Abs PENDING 1.7 - 7.7 K/uL   Band Neutrophils PENDING %   Lymphocytes Relative PENDING %   Lymphs Abs PENDING 0.7 - 4.0 K/uL   Monocytes Relative PENDING %   Monocytes Absolute PENDING 0.1 - 1.0 K/uL   Eosinophils Relative PENDING %   Eosinophils Absolute PENDING 0.0 - 0.7 K/uL   Basophils Relative PENDING %   Basophils Absolute PENDING 0.0 - 0.1 K/uL   WBC Morphology PENDING    RBC Morphology PENDING    Smear Review PENDING    nRBC PENDING 0 /100 WBC   Metamyelocytes Relative PENDING %   Myelocytes PENDING %   Promyelocytes Absolute PENDING %   Blasts PENDING %  Basic metabolic panel  Result Value Ref Range   Sodium 143 135 - 145 mmol/L   Potassium 4.4 3.5 - 5.1 mmol/L   Chloride 104 101 - 111 mmol/L   CO2 30 22 - 32 mmol/L   Glucose, Bld 146 (H) 65 - 99 mg/dL   BUN 19 6 - 20 mg/dL   Creatinine, Ser 0.96 0.44 - 1.00 mg/dL   Calcium 9.4 8.9 - 10.3 mg/dL   GFR calc non Af Amer >60 >60 mL/min   GFR calc Af Amer >60 >60 mL/min   Anion gap 9 5 - 15  I-Stat Arterial Blood Gas, ED - (order at [facility] and [facility] only)  Result Value Ref Range   pH, Arterial 7.317 (L) 7.350 - 7.450   pCO2 arterial 59.4 (H) 32.0 - 48.0 mmHg   pO2, Arterial 248.0  (H) 83.0 - 108.0 mmHg   Bicarbonate 30.6 (H) 20.0 - 28.0 mmol/L   TCO2 32 22 - 32 mmol/L   O2 Saturation 100.0 %   Acid-Base Excess 3.0 (H) 0.0 - 2.0 mmol/L   Patient temperature 97.9 F    Collection site RADIAL, ALLEN'S TEST ACCEPTABLE    Drawn by RT    Sample type ARTERIAL   I-Stat Troponin, ED (not at [facility])  Result Value Ref Range   Troponin i, poc 0.01 0.00 - 0.08 ng/mL   Comment 3            Dg Chest Portable 1 View  Result Date: 12/03/2016 CLINICAL DATA:  Sudden onset dyspnea tonight. Tachypnea and diaphoresis. EXAM: PORTABLE CHEST 1 VIEW COMPARISON:  11/04/2016 FINDINGS: Hyperinflation. Mild interstitial coarsening, likely chronic. No airspace consolidation. No pneumothorax. No effusion. Normal heart size. Normal pulmonary vasculature. Normal hilar and mediastinal contours. IMPRESSION: Hyperinflation.  No consolidation or effusion. Electronically Signed   By:  Andreas Newport M.D.   On: 12/03/2016 06:10   7:21 AM Patient has finished her CAT. She still remains tachypneic with some retractions. Lung sounds are still diminished. She is able to speak in shorter sentences. She is still requiring 2 L supplemental oxygen.  Blood pressure 98/77, reports this is normal pressure for her. She denies any dizziness or lightheadedness. Patient still feels her breathing has not returned to baseline. I feel that she will need admission, she agrees. Will discuss with triad for admission.  D/w Sharene Butters-- has evaluated in the ED and will admit.  We discussed trial of 2g Mg+ here, feel this is reasonable so has been ordered.   Larene Pickett, PA-C 12/03/16 1017    Forde Dandy, MD 12/03/16 [phone]

## 2016-12-03 NOTE — H&P (Signed)
History and Physical    Kim [name] [id number]:[id number] DOB: 05-24-1955 DOA: 12/03/2016   PCP: Marletta Lor, MD   Patient coming from:  Home    Chief Complaint: Shortness of breath   HPI: Kim [name] is a 61 y.o. female with medical history significant for COPD, on prescribed home O2 at 2 L (has not been using ), presenting with several day history of increasing shortness of breath. She denies any fever or chills. He does report cough. She denies any chest pain or palpitations. On EMS, the patient was placed on CPAP temporarily, given albuterol, steroids, and Zofran, later with the CPAP being removed upon arrival. Her last presentation for COPD  exacerbation at the ED was in July of this year. She has not had any recent admissions for it. She has been followed by pulmonology as outpatient, last visited on 10/23/2016, at which time she was in stable condition. Of note, she was at the time placed on prednisone taper due to some wheezing.  She denies any long distance trips, sick contacts. She reports the symptoms being similar to those of the past. Denies any hemoptysis. The patient is compliant with her inhalers at home. However, this time she did not find relief, for which she sought medical attention. She denies any abdominal pain, nausea or vomiting. Denies unexplained weight loss. She denies any lower extremity pain, syncope or near syncope. No dizziness or lightheadedness. She denies any tobacco use. She denies any alcohol or recreational drug use.    ED Course:  BP (!) 82/65   Pulse 85   Temp 97.9 F (36.6 C) (Oral)   Resp (!) 21   Ht 5\' 3"  (1.6 m)   Wt 59 kg (130 lb)   SpO2 96%   BMI 23.03 kg/m    EKG SR no ACS , Tn neg  CXR with hyperinflation, no masses or infiltrates  Received Zithromax, Atrovent, Proventil and Magnesium white count 13.6 blood and sputum cultures pending   Review of Systems:  As per HPI otherwise all other systems reviewed and are negative  Past  Medical History:  Diagnosis Date  . C8 RADICULOPATHY 06/05/2009  . COPD 08/31/2007  . DEPRESSION 11/19/2006  . DYSLIPIDEMIA 03/12/2009  . MI (mitral incompetence)   . 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 OSTEOARTHRITIS 06/05/2009  . PARESTHESIA 08/24/2007  . TOBACCO ABUSE 01/25/2010    Past Surgical History:  Procedure Laterality Date  . APPENDECTOMY  1969  . CARDIAC CATHETERIZATION N/A 07/05/2015   Procedure: Left Heart Cath and Coronary Angiography;  Surgeon: Leonie Man, MD;  Location: [facility];  Service: Cardiovascular;  Laterality: N/A;  . CHOLECYSTECTOMY  1989  . collapse lung  1990  . EXPLORATORY LAPAROTOMY  1969    Social History Social History   Social History  . Marital status: Married    Spouse name: N/A  . Number of children: 2  . Years of education: N/A   Occupational History  . Patient care aide-sits with elderly lady    Social History Main Topics  . Smoking status: Former Smoker    Packs/day: 2.00    Years: 38.00    Types: Cigarettes    Quit date: 03/07/2014  . Smokeless tobacco: Never Used  . Alcohol use No  . Drug use: No  . Sexual activity: Not on file   Other Topics Concern  . Not on file   Social History Narrative  . No narrative on file     Allergies  Allergen Reactions  . Penicillins Hives    Has patient had a PCN reaction causing immediate rash, facial/tongue/throat swelling, SOB or lightheadedness with hypotension: Yes Has patient had a PCN reaction causing severe rash involving mucus membranes or skin necrosis: No Has patient had a PCN reaction that required hospitalization No Has patient had a PCN reaction occurring within the last 10 years: No If all of the above answers are "NO", then may proceed with Cephalosporin use.     Family History  Problem Relation Age of Onset  . Cancer Mother        lung  . Heart attack Mother   . Hypertension Sister   . Multiple sclerosis Brother   . Diabetes Sister   . Rheum arthritis Sister    . Thyroid disease Sister   . Multiple sclerosis Other   . Alcohol abuse Other   . Arthritis Other   . Diabetes Other   . Kidney disease Other   . Cancer Other        lung,ovarian,skin, uterine  . Stroke Other   . Heart disease Other   . Melanoma Other   . Heart attack Maternal Uncle   . Heart attack Other        NEICE  . Heart attack Other        NEPHEW  . Hypertension Brother   . Stroke Maternal Grandmother   . Colon cancer Neg Hx       Prior to Admission medications   Medication Sig Start Date End Date Taking? Authorizing Provider  albuterol (PROVENTIL) (2.5 MG/3ML) 0.083% nebulizer solution Take 2.5 mg by nebulization every 6 (six) hours as needed for wheezing or shortness of breath.   Yes [provider]  ALPRAZolam Duanne Moron) 0.5 MG tablet TAKE 1/2 TO 1 TABLET BY MOUTH 3 TIMES A DAY 11/21/16  Yes Marletta Lor, MD  aspirin EC 81 MG EC tablet Take 1 tablet (81 mg total) by mouth daily. 07/08/15  Yes Nita Sells, MD  carvedilol (COREG) 3.125 MG tablet TAKE 1 TABLET BY MOUTH 2 TIMES DAILY WITH MEALS 08/13/16  Yes [name], MD  Cholecalciferol (VITAMIN D3) 1000 units CAPS Take 1 capsule (1,000 Units total) by mouth 2 (two) times daily. 08/13/15  Yes Isaiah Serge, NP  furosemide (LASIX) 40 MG tablet Take 40 mg by mouth daily as needed (for weight gain of 3 lbs).  08/31/15  Yes [provider]  lisinopril (PRINIVIL,ZESTRIL) 2.5 MG tablet Take 2.5 mg by mouth 2 (two) times daily. 10/03/16  Yes [provider]  nitroGLYCERIN (NITROSTAT) 0.4 MG SL tablet Place 1 tablet (0.4 mg total) under the tongue every 5 (five) minutes as needed for chest pain. 08/03/15  Yes Eileen Stanford, PA-C  omeprazole (PRILOSEC) 20 MG capsule Take 20 mg by mouth daily. 10/17/16  Yes [provider]  PAXIL CR 25 MG 24 hr tablet TAKE 1 TABLET BY MOUTH  EVERY MORNING 10/22/16  Yes Marletta Lor, MD  pravastatin (PRAVACHOL) 20 MG tablet take 1  tablet by mouth every evening 02/06/16  Yes [name], MD  SYMBICORT 160-4.5 MCG/ACT inhaler inhale 2 puffs by mouth twice a day 02/29/16  Yes Byrum, Rose Fillers, MD  Tiotropium Bromide Monohydrate (SPIRIVA RESPIMAT) 2.5 MCG/ACT AERS Inhale 2 puffs into the lungs daily. 09/10/16  Yes Tanda Rockers, MD  VENTOLIN HFA 108 (90 Base) MCG/ACT inhaler INHALE 2 PUFFS INTO THE LUNGS EVERY 4 HOURS AS NEEDED FOR WHEEZING OR SHORTNESS  OF BREATH 02/06/16  Yes Byrum, Rose Fillers, MD  vitamin B-12 (CYANOCOBALAMIN) 500 MCG tablet Take 500 mcg by mouth 2 (two) times daily.   Yes [provider]  azithromycin (ZITHROMAX) 250 MG tablet Take 1 tablet (250 mg total) by mouth daily. Take first 2 tablets together, then 1 every day until finished. Patient not taking: Reported on 12/03/2016 11/05/16   Forde Dandy, MD  HYDROcodone-acetaminophen [facility]) 7.5-325 MG tablet Take 1 tablet by mouth every 4 (four) hours as needed for moderate pain. Patient not taking: Reported on 12/03/2016 11/20/16   Marletta Lor, MD  predniSONE (STERAPRED UNI-PAK 21 TAB) 10 MG (21) TBPK tablet Take by mouth daily. Take 6 tabs by mouth daily  for 2 days, then 5 tabs for 2 days, then 4 tabs for 2 days, then 3 tabs for 2 days, 2 tabs for 2 days, then 1 tab by mouth daily for 2 days Patient not taking: Reported on 12/03/2016 11/04/16   Forde Dandy, MD    Physical Exam:  Vitals:   12/03/16 0704 12/03/16 0745 12/03/16 0800 12/03/16 0815  BP:   (!) 85/65 (!) 82/65  Pulse: 92 85 87 85  Resp: (!) 28 16 (!) 25 (!) 21  Temp:      TempSrc:      SpO2: (!) 89% 97% 97% 96%  Weight:      Height:       Constitutional: NAD, but ill appearing, afebrile Eyes: PERRL, lids and conjunctivae normal ENMT: Mucous membranes are very dry, without exudate or lesions  Neck: normal, supple, no masses, no thyromegaly Respiratory:  Expiratory wheezing, no crackles or rhonchi. Normal respiratory effort  Cardiovascular: Regular rate and rhythm, no  murmurs, rubs or gallops. No extremity edema. 2+ pedal pulses. No carotid bruits.  Abdomen: Soft, non tender, No hepatosplenomegaly. Bowel sounds positive.  Musculoskeletal: no clubbing / cyanosis. Moves all extremities Skin: no jaundice, No lesions.  Neurologic: Sensation intact  Strength equal in all extremities Psychiatric:   Alert and oriented x 3. Anxious appearing      Labs on Admission: I have personally reviewed following labs and imaging studies  CBC:  Recent Labs Lab 12/03/16 0545  WBC 13.6*  NEUTROABS 3.7  HGB 14.2  HCT 44.6  MCV 97.0  PLT 852    Basic Metabolic Panel:  Recent Labs Lab 12/03/16 0545  NA 143  K 4.4  CL 104  CO2 30  GLUCOSE 146*  BUN 19  CREATININE 0.96  CALCIUM 9.4    GFR: Estimated Creatinine Clearance: 50.3 mL/min (by C-G formula based on SCr of 0.96 mg/dL).  Liver Function Tests: No results for input(s): AST, ALT, ALKPHOS, BILITOT, PROT, ALBUMIN in the last 168 hours. No results for input(s): LIPASE, AMYLASE in the last 168 hours. No results for input(s): AMMONIA in the last 168 hours.  Coagulation Profile: No results for input(s): INR, PROTIME in the last 168 hours.  Cardiac Enzymes: No results for input(s): CKTOTAL, CKMB, CKMBINDEX, TROPONINI in the last 168 hours.  BNP (last 3 results)  Recent Labs  08/26/16 1058  PROBNP 43    HbA1C: No results for input(s): HGBA1C in the last 72 hours.  CBG: No results for input(s): GLUCAP in the last 168 hours.  Lipid Profile: No results for input(s): CHOL, HDL, LDLCALC, TRIG, CHOLHDL, LDLDIRECT in the last 72 hours.  Thyroid Function Tests: No results for input(s): TSH, T4TOTAL, FREET4, T3FREE, THYROIDAB in the last 72 hours.  Anemia Panel: No results  for input(s): VITAMINB12, FOLATE, FERRITIN, TIBC, IRON, RETICCTPCT in the last 72 hours.  Urine analysis:    Component Value Date/Time   COLORURINE yellow 02/26/2010 0951   APPEARANCEUR Clear 02/26/2010 0951   LABSPEC  1.025 02/26/2010 0951   PHURINE 5.0 02/26/2010 0951   HGBUR trace-lysed 02/26/2010 0951   BILIRUBINUR neg 03/07/2016 1041   PROTEINUR neg 03/07/2016 1041   UROBILINOGEN 0.2 03/07/2016 1041   UROBILINOGEN 0.2 02/26/2010 0951   NITRITE neg 03/07/2016 1041   NITRITE negative 02/26/2010 0951   LEUKOCYTESUR Negative 03/07/2016 1041    Sepsis Labs: @LABRCNTIP (procalcitonin:4,lacticidven:4) )No results found for this or any previous visit (from the past 240 hour(s)).   Radiological Exams on Admission: Dg Chest Portable 1 View  Result Date: 12/03/2016 CLINICAL DATA:  Sudden onset dyspnea tonight. Tachypnea and diaphoresis. EXAM: PORTABLE CHEST 1 VIEW COMPARISON:  11/04/2016 FINDINGS: Hyperinflation. Mild interstitial coarsening, likely chronic. No airspace consolidation. No pneumothorax. No effusion. Normal heart size. Normal pulmonary vasculature. Normal hilar and mediastinal contours. IMPRESSION: Hyperinflation.  No consolidation or effusion. Electronically Signed   By: Andreas Newport M.D.   On: 12/03/2016 06:10    EKG: Independently reviewed.  Assessment/Plan Active Problems:   COPD exacerbation (HCC)   Dyslipidemia   COPD GOLD III    Osteoarthritis   Brachial neuritis or radiculitis   Solitary pulmonary nodule   Acute respiratory distress   Chronic respiratory failure (HCC)   HLD (hyperlipidemia)   Acute chronic obstructive pulmonary disease with respiratory distress (HCC)      Acute on chronic respiratory failure with  hypoxia secondary to ongoing COPD exacerbation  Patient is supposed to be on 2L  O2 at home but non compliant with it .Currently in the 90s on 3L. No Bipap was needed eceived  Solumedrol, Nebs, Mag  andZithromax. BP is soft in the low 90s. Afebrile . Receiving 1 L bolus IVF, and then at 100 cc/h . Monitor for sepsis.  WBC 13.6 with atypical lymphs seen in smear   CXR NAD   Bicarb 30, Lactic acid is 1.9 Stepdown tele Levaquin IV  Duonebs q4 hrs  and Albuterol q  2 prn  Solu-Medrol 40 mg IV every 8 h with monitoring of CBG  Scheduled Mucinex bid  O2 CBC in am  Blood and sputum cultures Respiratory virus panel with next blood draw  Procalcitonin Lactic acid Respiratory therapy   Protonix IV  CXR in am     Known solitary nodule 4 mm followed by pulmonary as OP   Hypertension BP (!) 82/65   Pulse 85 currently BP is soft int he setting of acute respiratory distress, ? Developing sepsis . Patient reports that a normal blood pressure for ferries 90/60. Receiving 1 L in ED, then at 100 cc/h .  Continue home anti-hypertensive medications once BP improves  Continue hydration  And close BP monitoring. Check orthostatics  No pressors indicated at this time   Hyperlipidemia Continue home statins  Chronic combined systolic and diastolic CHF  CXR without effusion   Last 2 D echo EF 14-43 normal systolic weight 154 Lbs , Noacute issues  continue Coreg once BP improves  monitor I/Os and daily weights prn 02   CAD s/p ACS/ NSTEMI in 3/ 2017, s/p card cath 2017, followed by Dr. Johnsie Cancel. EKG SR no ACS patient is cardiac pain free at this time. Continue ASA and meds once BP improves. She has recently d/c Lisinopril due to cough   GERD, no acute symptoms Continue PPI  Anxiety/ Depression Continue home Xanax and Paxil   DVT prophylaxis: Lovenox   Code Status:   Full     Family Communication:  Discussed with patient Disposition Plan: Expect patient to be discharged to home after condition improves Consults called:    None Admission status:Tele Obs     Arshi Duarte E, PA-C [facility]   12/03/2016, 9:02 AM

## 2016-12-04 ENCOUNTER — Observation Stay (HOSPITAL_COMMUNITY): Payer: 59

## 2016-12-04 ENCOUNTER — Ambulatory Visit: Payer: 59 | Admitting: Emergency Medicine

## 2016-12-04 DIAGNOSIS — R0603 Acute respiratory distress: Secondary | ICD-10-CM

## 2016-12-04 DIAGNOSIS — J449 Chronic obstructive pulmonary disease, unspecified: Secondary | ICD-10-CM

## 2016-12-04 DIAGNOSIS — M5412 Radiculopathy, cervical region: Secondary | ICD-10-CM

## 2016-12-04 DIAGNOSIS — J9611 Chronic respiratory failure with hypoxia: Secondary | ICD-10-CM

## 2016-12-04 DIAGNOSIS — J441 Chronic obstructive pulmonary disease with (acute) exacerbation: Secondary | ICD-10-CM | POA: Diagnosis not present

## 2016-12-04 DIAGNOSIS — I110 Hypertensive heart disease with heart failure: Secondary | ICD-10-CM | POA: Diagnosis not present

## 2016-12-04 DIAGNOSIS — J9621 Acute and chronic respiratory failure with hypoxia: Secondary | ICD-10-CM | POA: Diagnosis not present

## 2016-12-04 DIAGNOSIS — I5043 Acute on chronic combined systolic (congestive) and diastolic (congestive) heart failure: Secondary | ICD-10-CM | POA: Diagnosis not present

## 2016-12-04 LAB — COMPREHENSIVE METABOLIC PANEL
ALT: 12 U/L — AB (ref 14–54)
AST: 23 U/L (ref 15–41)
Albumin: 3.1 g/dL — ABNORMAL LOW (ref 3.5–5.0)
Alkaline Phosphatase: 46 U/L (ref 38–126)
Anion gap: 7 (ref 5–15)
BUN: 14 mg/dL (ref 6–20)
CHLORIDE: 112 mmol/L — AB (ref 101–111)
CO2: 24 mmol/L (ref 22–32)
CREATININE: 0.73 mg/dL (ref 0.44–1.00)
Calcium: 9 mg/dL (ref 8.9–10.3)
Glucose, Bld: 148 mg/dL — ABNORMAL HIGH (ref 65–99)
POTASSIUM: 4.1 mmol/L (ref 3.5–5.1)
Sodium: 143 mmol/L (ref 135–145)
Total Bilirubin: 0.2 mg/dL — ABNORMAL LOW (ref 0.3–1.2)
Total Protein: 5.3 g/dL — ABNORMAL LOW (ref 6.5–8.1)

## 2016-12-04 LAB — PROTIME-INR
INR: 1.02
Prothrombin Time: 13.3 seconds (ref 11.4–15.2)

## 2016-12-04 LAB — CBC
HEMATOCRIT: 35.1 % — AB (ref 36.0–46.0)
Hemoglobin: 11.3 g/dL — ABNORMAL LOW (ref 12.0–15.0)
MCH: 30.6 pg (ref 26.0–34.0)
MCHC: 32.2 g/dL (ref 30.0–36.0)
MCV: 95.1 fL (ref 78.0–100.0)
PLATELETS: 223 10*3/uL (ref 150–400)
RBC: 3.69 MIL/uL — AB (ref 3.87–5.11)
RDW: 14.3 % (ref 11.5–15.5)
WBC: 13.3 10*3/uL — AB (ref 4.0–10.5)

## 2016-12-04 MED ORDER — PREDNISONE 50 MG PO TABS
60.0000 mg | ORAL_TABLET | Freq: Every day | ORAL | Status: DC
  Administered 2016-12-05: 10:00:00 60 mg via ORAL
  Filled 2016-12-04: qty 1

## 2016-12-04 MED ORDER — LEVOFLOXACIN 500 MG PO TABS
500.0000 mg | ORAL_TABLET | Freq: Every day | ORAL | Status: DC
  Administered 2016-12-05: 500 mg via ORAL
  Filled 2016-12-04: qty 1

## 2016-12-04 MED ORDER — IPRATROPIUM-ALBUTEROL 0.5-2.5 (3) MG/3ML IN SOLN
3.0000 mL | Freq: Three times a day (TID) | RESPIRATORY_TRACT | Status: DC
  Administered 2016-12-04 – 2016-12-05 (×4): 3 mL via RESPIRATORY_TRACT
  Filled 2016-12-04 (×4): qty 3

## 2016-12-04 MED ORDER — GUAIFENESIN ER 600 MG PO TB12
1200.0000 mg | ORAL_TABLET | Freq: Two times a day (BID) | ORAL | Status: DC | PRN
  Administered 2016-12-04: 1200 mg via ORAL
  Filled 2016-12-04: qty 2

## 2016-12-04 NOTE — Progress Notes (Signed)
PROGRESS NOTE  Kim [name]  MWN:[id number] DOB: Mar 13, 1956 DOA: 12/03/2016 PCP: Marletta Lor, MD Outpatient Specialists:  Subjective: Patient seen with her husband bedside, feels better but she still has shortness of breath and wheezing. 6 complains about cough with minimal sputum production  Brief Narrative:  Kim [name] is a 61 y.o. female with medical history significant for COPD, on prescribed home O2 at 2 L (has not been using ), presenting with several day history of increasing shortness of breath. She denies any fever or chills. He does report cough. She denies any chest pain or palpitations. On EMS, the patient was placed on CPAP temporarily, given albuterol, steroids, and Zofran, later with the CPAP being removed upon arrival. Her last presentation for COPD  exacerbation at the ED was in July of this year. She has not had any recent admissions for it. She has been followed by pulmonology as outpatient, last visited on 10/23/2016, at which time she was in stable condition. Of note, she was at the time placed on prednisone taper due to some wheezing.  She denies any long distance trips, sick contacts. She reports the symptoms being similar to those of the past. Denies any hemoptysis. The patient is compliant with her inhalers at home. However, this time she did not find relief, for which she sought medical attention. She denies any abdominal pain, nausea or vomiting. Denies unexplained weight loss. She denies any lower extremity pain, syncope or near syncope. No dizziness or lightheadedness. She denies any tobacco use. She denies any alcohol or recreational drug use.   Assessment & Plan:   Active Problems:   Dyslipidemia   COPD GOLD III    Osteoarthritis   Brachial neuritis or radiculitis   Solitary pulmonary nodule   COPD exacerbation (HCC)   Acute respiratory distress   Chronic respiratory failure (HCC)   HLD (hyperlipidemia)   Acute chronic obstructive pulmonary  disease with respiratory distress (HCC)   Acute on chronic respiratory failure with  hypoxia secondary to ongoing COPD exacerbation   -Supposed to be on 2 L of oxygen at home secondary to COPD. -Scheduled require 3 L of oxygen to keep O2 sats about 90%. -This is likely secondary to COPD exacerbation.  Acute COPD exacerbation -Presented with SOB, wheezing, cough and minimal sputum production. -CXR showed no evidence of pneumonia. -Started on antibiotics and steroids. -Supportive management with bronchodilators, mucolytics, antitussives and oxygen.    Known solitary nodule 4 mm followed by pulmonary as OP   Hypertension BP (!) 82/65   Pulse 85 currently BP is soft int he setting of acute respiratory distress, ? Developing sepsis . Patient reports that a normal blood pressure for ferries 90/60. Receiving 1 L in ED, then at 100 cc/h .  Continue home anti-hypertensive medications once BP improves  Continue hydration  And close BP monitoring. Check orthostatics  No pressors indicated at this time   Hyperlipidemia Continue home statins  Chronic combined systolic and diastolic CHF  CXR without effusion   Last 2 D echo EF 40-34 normal systolic weight 742 Lbs , Noacute issues  continue Coreg once BP improves  monitor I/Os and daily weights prn 02   CAD s/p ACS/ NSTEMI in 3/ 2017, s/p card cath 2017, followed by Dr. Johnsie Cancel. EKG SR no ACS patient is cardiac pain free at this time. Continue ASA and meds once BP improves. She has recently d/c Lisinopril due to cough   GERD, no acute symptoms Continue PPI  Anxiety/ Depression  Continue home Xanax and Paxil    DVT prophylaxis: Lovenox Code Status: Full Code Family Communication:  Disposition Plan:  Diet: Diet regular Room service appropriate? Yes; Fluid consistency: Thin  Consultants:   None  Procedures:   None  Antimicrobials:   None  Objective: Vitals:   12/04/16 0400 12/04/16 0826 12/04/16 0829 12/04/16  1243  BP: 110/74 113/60  120/77  Pulse: 85 92  (!) 103  Resp: 19 17  17   Temp: 97.9 F (36.6 C) 98.2 F (36.8 C)  98.9 F (37.2 C)  TempSrc: Oral Oral  Oral  SpO2: 98% 97% 97% 97%  Weight:      Height:        Intake/Output Summary (Last 24 hours) at 12/04/16 1342 Last data filed at 12/04/16 1300  Gross per 24 hour  Intake             1880 ml  Output                0 ml  Net             1880 ml   Filed Weights   12/03/16 0525  Weight: 59 kg (130 lb)    Examination: General exam: Appears calm and comfortable  Respiratory system: Clear to auscultation. Respiratory effort normal. Cardiovascular system: S1 & S2 heard, RRR. No JVD, murmurs, rubs, gallops or clicks. No pedal edema. Gastrointestinal system: Abdomen is nondistended, soft and nontender. No organomegaly or masses felt. Normal bowel sounds heard. Central nervous system: Alert and oriented. No focal neurological deficits. Extremities: Symmetric 5 x 5 power. Skin: No rashes, lesions or ulcers Psychiatry: Judgement and insight appear normal. Mood & affect appropriate.   Data Reviewed: I have personally reviewed following labs and imaging studies  CBC:  Recent Labs Lab 12/03/16 0545 12/04/16 0514  WBC 13.6* 13.3*  NEUTROABS 3.7  --   HGB 14.2 11.3*  HCT 44.6 35.1*  MCV 97.0 95.1  PLT 278 027   Basic Metabolic Panel:  Recent Labs Lab 12/03/16 0545 12/04/16 0514  NA 143 143  K 4.4 4.1  CL 104 112*  CO2 30 24  GLUCOSE 146* 148*  BUN 19 14  CREATININE 0.96 0.73  CALCIUM 9.4 9.0   GFR: Estimated Creatinine Clearance: 60.3 mL/min (by C-G formula based on SCr of 0.73 mg/dL). Liver Function Tests:  Recent Labs Lab 12/04/16 0514  AST 23  ALT 12*  ALKPHOS 46  BILITOT 0.2*  PROT 5.3*  ALBUMIN 3.1*   No results for input(s): LIPASE, AMYLASE in the last 168 hours. No results for input(s): AMMONIA in the last 168 hours. Coagulation Profile:  Recent Labs Lab 12/04/16 0514  INR 1.02   Cardiac  Enzymes: No results for input(s): CKTOTAL, CKMB, CKMBINDEX, TROPONINI in the last 168 hours. BNP (last 3 results)  Recent Labs  08/26/16 1058  PROBNP 43   HbA1C: No results for input(s): HGBA1C in the last 72 hours. CBG: No results for input(s): GLUCAP in the last 168 hours. Lipid Profile: No results for input(s): CHOL, HDL, LDLCALC, TRIG, CHOLHDL, LDLDIRECT in the last 72 hours. Thyroid Function Tests: No results for input(s): TSH, T4TOTAL, FREET4, T3FREE, THYROIDAB in the last 72 hours. Anemia Panel: No results for input(s): VITAMINB12, FOLATE, FERRITIN, TIBC, IRON, RETICCTPCT in the last 72 hours. Urine analysis:    Component Value Date/Time   COLORURINE yellow 02/26/2010 0951   APPEARANCEUR Clear 02/26/2010 0951   LABSPEC 1.025 02/26/2010 0951   PHURINE 5.0 02/26/2010 0951  HGBUR trace-lysed 02/26/2010 0951   BILIRUBINUR neg 03/07/2016 1041   PROTEINUR neg 03/07/2016 1041   UROBILINOGEN 0.2 03/07/2016 1041   UROBILINOGEN 0.2 02/26/2010 0951   NITRITE neg 03/07/2016 1041   NITRITE negative 02/26/2010 0951   LEUKOCYTESUR Negative 03/07/2016 1041   Sepsis Labs: @LABRCNTIP (procalcitonin:4,lacticidven:4)  ) Recent Results (from the past 240 hour(s))  Culture, blood (x 2)     Status: None (Preliminary result)   Collection Time: 12/03/16  8:38 AM  Result Value Ref Range Status   Specimen Description BLOOD LEFT HAND  Final   Special Requests   Final    BOTTLES DRAWN AEROBIC AND ANAEROBIC Blood Culture adequate volume   Culture NO GROWTH 1 DAY  Final   Report Status PENDING  Incomplete  Culture, blood (x 2)     Status: None (Preliminary result)   Collection Time: 12/03/16  9:28 AM  Result Value Ref Range Status   Specimen Description BLOOD RIGHT HAND  Final   Special Requests   Final    BOTTLES DRAWN AEROBIC AND ANAEROBIC Blood Culture adequate volume   Culture NO GROWTH 1 DAY  Final   Report Status PENDING  Incomplete  Respiratory Panel by PCR     Status: None    Collection Time: 12/03/16  3:13 PM  Result Value Ref Range Status   Adenovirus NOT DETECTED NOT DETECTED Final   Coronavirus 229E NOT DETECTED NOT DETECTED Final   Coronavirus HKU1 NOT DETECTED NOT DETECTED Final   Coronavirus NL63 NOT DETECTED NOT DETECTED Final   Coronavirus OC43 NOT DETECTED NOT DETECTED Final   Metapneumovirus NOT DETECTED NOT DETECTED Final   Rhinovirus / Enterovirus NOT DETECTED NOT DETECTED Final   Influenza A NOT DETECTED NOT DETECTED Final   Influenza B NOT DETECTED NOT DETECTED Final   Parainfluenza Virus 1 NOT DETECTED NOT DETECTED Final   Parainfluenza Virus 2 NOT DETECTED NOT DETECTED Final   Parainfluenza Virus 3 NOT DETECTED NOT DETECTED Final   Parainfluenza Virus 4 NOT DETECTED NOT DETECTED Final   Respiratory Syncytial Virus NOT DETECTED NOT DETECTED Final   Bordetella pertussis NOT DETECTED NOT DETECTED Final   Chlamydophila pneumoniae NOT DETECTED NOT DETECTED Final   Mycoplasma pneumoniae NOT DETECTED NOT DETECTED Final  MRSA PCR Screening     Status: None   Collection Time: 12/03/16  4:00 PM  Result Value Ref Range Status   MRSA by PCR NEGATIVE NEGATIVE Final    Comment:        The GeneXpert MRSA Assay (FDA approved for NASAL specimens only), is one component of a comprehensive MRSA colonization surveillance program. It is not intended to diagnose MRSA infection nor to guide or monitor treatment for MRSA infections.      Invalid input(s): PROCALCITONIN, LACTICACIDVEN   Radiology Studies: Dg Chest 2 View  Result Date: 12/04/2016 CLINICAL DATA:  61 year old female with shortness of breath since yesterday. EXAM: CHEST  2 VIEW COMPARISON:  12/03/2016 FINDINGS: The heart size and mediastinal contours are within normal limits. The stable hyperinflation is consistent with COPD. The lungs are otherwise clear. The visualized skeletal structures are unremarkable. IMPRESSION: Stable changes of COPD without acute disease. Electronically Signed    By: Kristopher Oppenheim M.D.   On: 12/04/2016 09:02   Dg Chest Portable 1 View  Result Date: 12/03/2016 CLINICAL DATA:  Sudden onset dyspnea tonight. Tachypnea and diaphoresis. EXAM: PORTABLE CHEST 1 VIEW COMPARISON:  11/04/2016 FINDINGS: Hyperinflation. Mild interstitial coarsening, likely chronic. No airspace consolidation. No pneumothorax. No effusion.  Normal heart size. Normal pulmonary vasculature. Normal hilar and mediastinal contours. IMPRESSION: Hyperinflation.  No consolidation or effusion. Electronically Signed   By: Andreas Newport M.D.   On: 12/03/2016 06:10        Scheduled Meds: . aspirin EC  81 mg Oral Daily  . carvedilol  3.125 mg Oral BID WC  . vitamin B-12  500 mcg Oral BID  . enoxaparin (LOVENOX) injection  40 mg Subcutaneous Q24H  . ipratropium-albuterol  3 mL Nebulization TID  . [START ON 12/05/2016] levofloxacin  500 mg Oral Daily  . pantoprazole  40 mg Oral Daily  . PARoxetine  25 mg Oral q morning - 10a  . pravastatin  20 mg Oral QPM   Continuous Infusions: . sodium chloride 100 mL/hr at 12/04/16 0256     LOS: 0 days    Time spent: 35 minutes    Birdie Hopes, MD [facility] Pager [phone]  If 7PM-7AM, please contact night-coverage [email or web address] Password [email or web address] 12/04/2016, 1:42 PM

## 2016-12-05 DIAGNOSIS — J9611 Chronic respiratory failure with hypoxia: Secondary | ICD-10-CM | POA: Diagnosis not present

## 2016-12-05 DIAGNOSIS — R911 Solitary pulmonary nodule: Secondary | ICD-10-CM | POA: Diagnosis not present

## 2016-12-05 DIAGNOSIS — J449 Chronic obstructive pulmonary disease, unspecified: Secondary | ICD-10-CM | POA: Diagnosis not present

## 2016-12-05 DIAGNOSIS — J441 Chronic obstructive pulmonary disease with (acute) exacerbation: Secondary | ICD-10-CM | POA: Diagnosis not present

## 2016-12-05 LAB — GLUCOSE, CAPILLARY: GLUCOSE-CAPILLARY: 83 mg/dL (ref 65–99)

## 2016-12-05 LAB — PROCALCITONIN: Procalcitonin: <0.1 ng/mL

## 2016-12-05 MED ORDER — PREDNISONE 10 MG (21) PO TBPK: ORAL_TABLET | ORAL | 0 refills | Status: DC

## 2016-12-05 MED ORDER — LEVOFLOXACIN 500 MG PO TABS: 500.0000 mg | ORAL_TABLET | Freq: Every day | ORAL | 0 refills | Status: DC

## 2016-12-05 MED ORDER — GUAIFENESIN ER 600 MG PO TB12: 1200.0000 mg | ORAL_TABLET | Freq: Two times a day (BID) | ORAL | 0 refills | Status: DC | PRN

## 2016-12-05 NOTE — Progress Notes (Signed)
Pharmacy Antibiotic Note  Kim [name] is a 61 y.o. female  with COPD exacerbation.  Pharmacy has been consulted for levaquin dosing (changed to po on 8/31) -afebrile, CrCl ~ 60  Levaquin 8/29>>  8/29 blood x2- ngtd  Plan: No levaquin changes needed Consider total of 5-7days treatment? Will sign off. Please contact pharmacy with any other needs.  Thank you Hildred Laser, Pharm D 12/05/2016 10:44 AM     Height: 5\' 3"  (160 cm) Weight: 136 lb 12.8 oz (62.1 kg) IBW/kg (Calculated) : 52.4  Temp (24hrs), Avg:98.4 F (36.9 C), Min:98.1 F (36.7 C), Max:98.9 F (37.2 C)   Recent Labs Lab 12/03/16 0545 12/03/16 0834 12/03/16 0921 12/04/16 0514  WBC 13.6*  --   --  13.3*  CREATININE 0.96  --   --  0.73  LATICACIDVEN  --  1.9 1.8  --     Estimated Creatinine Clearance: 60.3 mL/min (by C-G formula based on SCr of 0.73 mg/dL).    Allergies  Allergen Reactions  . Penicillins Hives    Has patient had a PCN reaction causing immediate rash, facial/tongue/throat swelling, SOB or lightheadedness with hypotension: Yes Has patient had a PCN reaction causing severe rash involving mucus membranes or skin necrosis: No Has patient had a PCN reaction that required hospitalization No Has patient had a PCN reaction occurring within the last 10 years: No If all of the above answers are "NO", then may proceed with Cephalosporin use.

## 2016-12-05 NOTE — Progress Notes (Signed)
Pt being discharged to home, all discharge instructions and prescriptions given to patient with husband at bedside, not questions/concerns at this time.

## 2016-12-05 NOTE — Discharge Summary (Signed)
Physician Discharge Summary  Kim [name] WJX:[id number] DOB: [birth year] DOA: 12/03/2016  PCP: Marletta Lor, MD  Admit date: 12/03/2016 Discharge date: 12/05/2016  Admitted From: Home Disposition: Home  Recommendations for Outpatient Follow-up:  1. Follow up with PCP in 1-2 weeks 2. Please obtain BMP/CBC in one week 3. Please follow up on the following pending results:  Home Health: NA Equipment/Devices:NA  Discharge Condition: Stable CODE STATUS: Full Code Diet recommendation: Diet regular Room service appropriate? Yes; Fluid consistency: Thin Diet - low sodium heart healthy  Brief/Interim Summary: Kim Schelling Colemanis a 61 y.o.femalewith medical history significant for COPD, on prescribed home O2 at 2 L (has not been using ), presenting with several day history of increasing shortness of breath. She denies any fever or chills. He does report cough. She denies any chest pain or palpitations. On EMS, the patient was placed on CPAP temporarily, given albuterol, steroids, and Zofran, later with the CPAP being removed upon arrival. Her last presentation for COPD exacerbation at the ED was in July of this year. She has not had any recent admissionsfor it. She has been followed by pulmonology as outpatient, last visited on 10/23/2016, at which time she was in stable condition. Of note, she was at the time placed on prednisone taper due to some wheezing. She denies any long distance trips, sick contacts. She reports the symptoms being similar to those of the past. Denies any hemoptysis. The patient is compliant with her inhalers at home. However, this time she did not find relief, for which she sought medical attention. She denies any abdominal pain, nausea or vomiting. Denies unexplained weight loss. She denies any lower extremity pain, syncope or near syncope. No dizziness or lightheadedness. She denies any tobacco use. She denies any alcohol or recreational drug use.   Discharge  Diagnoses:  Active Problems:   Dyslipidemia   COPD GOLD III    Osteoarthritis   Brachial neuritis or radiculitis   Solitary pulmonary nodule   COPD exacerbation (HCC)   Acute respiratory distress   Chronic respiratory failure (HCC)   HLD (hyperlipidemia)   Acute chronic obstructive pulmonary disease with respiratory distress (HCC)    Acute on chronic respiratory failure with hypoxiasecondary to ongoing COPD exacerbation  -Supposed to be on 2 L of oxygen at home secondary to COPD. -Scheduled require 3 L of oxygen to keep O2 sats about 90%. -This is likely secondary to COPD exacerbation.  Acute COPD exacerbation -Presented with SOB, wheezing, cough and minimal sputum production. -CXR showed no evidence of pneumonia. -Started on antibiotics and steroids. -Treated with upportive management with bronchodilators, mucolytics, antitussives and oxygen. -Discharged on levofloxacin for 5 more days, steroid taper and Mucinex.  Known solitary nodule 4 mm followed by pulmonary as OP   Hypertension BP (!) 82/65  Pulse 85  -On admission blood pressure soft, this is back to normal, restarted her home medications.   Hyperlipidemia Continue home statins  Chronic combined systolic and diastolic CHFCXR without effusionLast 2 D echo EF 21-30 normal systolic weight 865 Lbs , Noacute issues  Continue Coreg and lisinopril.   CAD s/p ACS/ NSTEMIin 06/2015, s/p card cath 2017, followed by Dr. Johnsie Cancel. EKGSR no ACS patient is cardiac pain free at this time. Continue ASA and meds once BP improves. She has recently d/c Lisinopril due to cough   GERD,no acute symptoms Continue PPI  Anxiety/ Depression Continue home Xanax and Paxil   Discharge Instructions  Discharge Instructions    Diet - low sodium  heart healthy    Complete by:  As directed    Increase activity slowly    Complete by:  As directed      Allergies as of 12/05/2016      Reactions   Penicillins  Hives   Has patient had a PCN reaction causing immediate rash, facial/tongue/throat swelling, SOB or lightheadedness with hypotension: Yes Has patient had a PCN reaction causing severe rash involving mucus membranes or skin necrosis: No Has patient had a PCN reaction that required hospitalization No Has patient had a PCN reaction occurring within the last 10 years: No If all of the above answers are "NO", then may proceed with Cephalosporin use.      Medication List    STOP taking these medications   azithromycin 250 MG tablet Commonly known as:  ZITHROMAX   HYDROcodone-acetaminophen 7.5-325 MG tablet Commonly known as:  NORCO     TAKE these medications   albuterol (2.5 MG/3ML) 0.083% nebulizer solution Commonly known as:  PROVENTIL Take 2.5 mg by nebulization every 6 (six) hours as needed for wheezing or shortness of breath.   VENTOLIN HFA 108 (90 Base) MCG/ACT inhaler Generic drug:  albuterol INHALE 2 PUFFS INTO THE LUNGS EVERY 4 HOURS AS NEEDED FOR WHEEZING OR SHORTNESS OF BREATH   ALPRAZolam 0.5 MG tablet Commonly known as:  XANAX TAKE 1/2 TO 1 TABLET BY MOUTH 3 TIMES A DAY   aspirin 81 MG EC tablet Take 1 tablet (81 mg total) by mouth daily.   carvedilol 3.125 MG tablet Commonly known as:  COREG TAKE 1 TABLET BY MOUTH 2 TIMES DAILY WITH MEALS   furosemide 40 MG tablet Commonly known as:  LASIX Take 40 mg by mouth daily as needed (for weight gain of 3 lbs).   guaiFENesin 600 MG 12 hr tablet Commonly known as:  MUCINEX Take 2 tablets (1,200 mg total) by mouth 2 (two) times daily as needed for to loosen phlegm.   levofloxacin 500 MG tablet Commonly known as:  LEVAQUIN Take 1 tablet (500 mg total) by mouth daily.   lisinopril 2.5 MG tablet Commonly known as:  PRINIVIL,ZESTRIL Take 2.5 mg by mouth 2 (two) times daily.   nitroGLYCERIN 0.4 MG SL tablet Commonly known as:  NITROSTAT Place 1 tablet (0.4 mg total) under the tongue every 5 (five) minutes as needed  for chest pain.   omeprazole 20 MG capsule Commonly known as:  PRILOSEC Take 20 mg by mouth daily.   PAXIL CR 25 MG 24 hr tablet Generic drug:  PARoxetine TAKE 1 TABLET BY MOUTH  EVERY MORNING   pravastatin 20 MG tablet Commonly known as:  PRAVACHOL take 1 tablet by mouth every evening   predniSONE 10 MG (21) Tbpk tablet Commonly known as:  STERAPRED UNI-PAK 21 TAB Take 6-5-4-3-2-1 tablet PO daily as directed What changed:  how to take this  when to take this  additional instructions   SYMBICORT 160-4.5 MCG/ACT inhaler Generic drug:  budesonide-formoterol inhale 2 puffs by mouth twice a day   Tiotropium Bromide Monohydrate 2.5 MCG/ACT Aers Commonly known as:  SPIRIVA RESPIMAT Inhale 2 puffs into the lungs daily.   vitamin B-12 500 MCG tablet Commonly known as:  CYANOCOBALAMIN Take 500 mcg by mouth 2 (two) times daily.   Vitamin D3 1000 units Caps Take 1 capsule (1,000 Units total) by mouth 2 (two) times daily.            Discharge Care Instructions        Start  Ordered   12/06/16 0000  levofloxacin (LEVAQUIN) 500 MG tablet  Daily     12/05/16 1057   12/05/16 0000  predniSONE (STERAPRED UNI-PAK 21 TAB) 10 MG (21) TBPK tablet     12/05/16 1057   12/05/16 0000  guaiFENesin (MUCINEX) 600 MG 12 hr tablet  2 times daily PRN     12/05/16 1057   12/05/16 0000  Increase activity slowly     12/05/16 1057   12/05/16 0000  Diet - low sodium heart healthy     12/05/16 1057      Allergies  Allergen Reactions  . Penicillins Hives    Has patient had a PCN reaction causing immediate rash, facial/tongue/throat swelling, SOB or lightheadedness with hypotension: Yes Has patient had a PCN reaction causing severe rash involving mucus membranes or skin necrosis: No Has patient had a PCN reaction that required hospitalization No Has patient had a PCN reaction occurring within the last 10 years: No If all of the above answers are "NO", then may proceed with  Cephalosporin use.     Consultations:  None   Procedures (Echo, Carotid, EGD, Colonoscopy, ERCP)   Radiological studies: Dg Chest 2 View  Result Date: 12/04/2016 CLINICAL DATA:  61 year old female with shortness of breath since yesterday. EXAM: CHEST  2 VIEW COMPARISON:  12/03/2016 FINDINGS: The heart size and mediastinal contours are within normal limits. The stable hyperinflation is consistent with COPD. The lungs are otherwise clear. The visualized skeletal structures are unremarkable. IMPRESSION: Stable changes of COPD without acute disease. Electronically Signed   By: Kristopher Oppenheim M.D.   On: 12/04/2016 09:02   Dg Chest Portable 1 View  Result Date: 12/03/2016 CLINICAL DATA:  Sudden onset dyspnea tonight. Tachypnea and diaphoresis. EXAM: PORTABLE CHEST 1 VIEW COMPARISON:  11/04/2016 FINDINGS: Hyperinflation. Mild interstitial coarsening, likely chronic. No airspace consolidation. No pneumothorax. No effusion. Normal heart size. Normal pulmonary vasculature. Normal hilar and mediastinal contours. IMPRESSION: Hyperinflation.  No consolidation or effusion. Electronically Signed   By: Andreas Newport M.D.   On: 12/03/2016 06:10     Subjective:  Discharge Exam: Vitals:   12/05/16 0500 12/05/16 0800 12/05/16 0807 12/05/16 0907  BP:  133/75 (!) 138/94   Pulse:  82 95   Resp:  (!) 23 19   Temp:   98.2 F (36.8 C)   TempSrc:   Oral   SpO2:  95% 96% 95%  Weight: 62.1 kg (136 lb 12.8 oz)     Height:       General: Pt is alert, awake, not in acute distress Cardiovascular: RRR, S1/S2 +, no rubs, no gallops Respiratory: CTA bilaterally, no wheezing, no rhonchi Abdominal: Soft, NT, ND, bowel sounds + Extremities: no edema, no cyanosis   The results of significant diagnostics from this hospitalization (including imaging, microbiology, ancillary and laboratory) are listed below for reference.    Microbiology: Recent Results (from the past 240 hour(s))  Culture, blood (x 2)      Status: None (Preliminary result)   Collection Time: 12/03/16  8:38 AM  Result Value Ref Range Status   Specimen Description BLOOD LEFT HAND  Final   Special Requests   Final    BOTTLES DRAWN AEROBIC AND ANAEROBIC Blood Culture adequate volume   Culture NO GROWTH 1 DAY  Final   Report Status PENDING  Incomplete  Culture, blood (x 2)     Status: None (Preliminary result)   Collection Time: 12/03/16  9:28 AM  Result Value Ref Range Status  Specimen Description BLOOD RIGHT HAND  Final   Special Requests   Final    BOTTLES DRAWN AEROBIC AND ANAEROBIC Blood Culture adequate volume   Culture NO GROWTH 1 DAY  Final   Report Status PENDING  Incomplete  Respiratory Panel by PCR     Status: None   Collection Time: 12/03/16  3:13 PM  Result Value Ref Range Status   Adenovirus NOT DETECTED NOT DETECTED Final   Coronavirus 229E NOT DETECTED NOT DETECTED Final   Coronavirus HKU1 NOT DETECTED NOT DETECTED Final   Coronavirus NL63 NOT DETECTED NOT DETECTED Final   Coronavirus OC43 NOT DETECTED NOT DETECTED Final   Metapneumovirus NOT DETECTED NOT DETECTED Final   Rhinovirus / Enterovirus NOT DETECTED NOT DETECTED Final   Influenza A NOT DETECTED NOT DETECTED Final   Influenza B NOT DETECTED NOT DETECTED Final   Parainfluenza Virus 1 NOT DETECTED NOT DETECTED Final   Parainfluenza Virus 2 NOT DETECTED NOT DETECTED Final   Parainfluenza Virus 3 NOT DETECTED NOT DETECTED Final   Parainfluenza Virus 4 NOT DETECTED NOT DETECTED Final   Respiratory Syncytial Virus NOT DETECTED NOT DETECTED Final   Bordetella pertussis NOT DETECTED NOT DETECTED Final   Chlamydophila pneumoniae NOT DETECTED NOT DETECTED Final   Mycoplasma pneumoniae NOT DETECTED NOT DETECTED Final  MRSA PCR Screening     Status: None   Collection Time: 12/03/16  4:00 PM  Result Value Ref Range Status   MRSA by PCR NEGATIVE NEGATIVE Final    Comment:        The GeneXpert MRSA Assay (FDA approved for NASAL specimens only), is one  component of a comprehensive MRSA colonization surveillance program. It is not intended to diagnose MRSA infection nor to guide or monitor treatment for MRSA infections.      Labs: BNP (last 3 results) No results for input(s): BNP in the last 8760 hours. Basic Metabolic Panel:  Recent Labs Lab 12/03/16 0545 12/04/16 0514  NA 143 143  K 4.4 4.1  CL 104 112*  CO2 30 24  GLUCOSE 146* 148*  BUN 19 14  CREATININE 0.96 0.73  CALCIUM 9.4 9.0   Liver Function Tests:  Recent Labs Lab 12/04/16 0514  AST 23  ALT 12*  ALKPHOS 46  BILITOT 0.2*  PROT 5.3*  ALBUMIN 3.1*   No results for input(s): LIPASE, AMYLASE in the last 168 hours. No results for input(s): AMMONIA in the last 168 hours. CBC:  Recent Labs Lab 12/03/16 0545 12/04/16 0514  WBC 13.6* 13.3*  NEUTROABS 3.7  --   HGB 14.2 11.3*  HCT 44.6 35.1*  MCV 97.0 95.1  PLT 278 223   Cardiac Enzymes: No results for input(s): CKTOTAL, CKMB, CKMBINDEX, TROPONINI in the last 168 hours. BNP: Invalid input(s): POCBNP CBG:  Recent Labs Lab 12/05/16 0811  GLUCAP 83   D-Dimer No results for input(s): DDIMER in the last 72 hours. Hgb A1c No results for input(s): HGBA1C in the last 72 hours. Lipid Profile No results for input(s): CHOL, HDL, LDLCALC, TRIG, CHOLHDL, LDLDIRECT in the last 72 hours. Thyroid function studies No results for input(s): TSH, T4TOTAL, T3FREE, THYROIDAB in the last 72 hours.  Invalid input(s): FREET3 Anemia work up No results for input(s): VITAMINB12, FOLATE, FERRITIN, TIBC, IRON, RETICCTPCT in the last 72 hours. Urinalysis    Component Value Date/Time   COLORURINE yellow 02/26/2010 0951   APPEARANCEUR Clear 02/26/2010 0951   LABSPEC 1.025 02/26/2010 0951   PHURINE 5.0 02/26/2010 0951   HGBUR  trace-lysed 02/26/2010 0951   BILIRUBINUR neg 03/07/2016 1041   PROTEINUR neg 03/07/2016 1041   UROBILINOGEN 0.2 03/07/2016 1041   UROBILINOGEN 0.2 02/26/2010 0951   NITRITE neg 03/07/2016  1041   NITRITE negative 02/26/2010 0951   LEUKOCYTESUR Negative 03/07/2016 1041   Sepsis Labs Invalid input(s): PROCALCITONIN,  WBC,  LACTICIDVEN Microbiology Recent Results (from the past 240 hour(s))  Culture, blood (x 2)     Status: None (Preliminary result)   Collection Time: 12/03/16  8:38 AM  Result Value Ref Range Status   Specimen Description BLOOD LEFT HAND  Final   Special Requests   Final    BOTTLES DRAWN AEROBIC AND ANAEROBIC Blood Culture adequate volume   Culture NO GROWTH 1 DAY  Final   Report Status PENDING  Incomplete  Culture, blood (x 2)     Status: None (Preliminary result)   Collection Time: 12/03/16  9:28 AM  Result Value Ref Range Status   Specimen Description BLOOD RIGHT HAND  Final   Special Requests   Final    BOTTLES DRAWN AEROBIC AND ANAEROBIC Blood Culture adequate volume   Culture NO GROWTH 1 DAY  Final   Report Status PENDING  Incomplete  Respiratory Panel by PCR     Status: None   Collection Time: 12/03/16  3:13 PM  Result Value Ref Range Status   Adenovirus NOT DETECTED NOT DETECTED Final   Coronavirus 229E NOT DETECTED NOT DETECTED Final   Coronavirus HKU1 NOT DETECTED NOT DETECTED Final   Coronavirus NL63 NOT DETECTED NOT DETECTED Final   Coronavirus OC43 NOT DETECTED NOT DETECTED Final   Metapneumovirus NOT DETECTED NOT DETECTED Final   Rhinovirus / Enterovirus NOT DETECTED NOT DETECTED Final   Influenza A NOT DETECTED NOT DETECTED Final   Influenza B NOT DETECTED NOT DETECTED Final   Parainfluenza Virus 1 NOT DETECTED NOT DETECTED Final   Parainfluenza Virus 2 NOT DETECTED NOT DETECTED Final   Parainfluenza Virus 3 NOT DETECTED NOT DETECTED Final   Parainfluenza Virus 4 NOT DETECTED NOT DETECTED Final   Respiratory Syncytial Virus NOT DETECTED NOT DETECTED Final   Bordetella pertussis NOT DETECTED NOT DETECTED Final   Chlamydophila pneumoniae NOT DETECTED NOT DETECTED Final   Mycoplasma pneumoniae NOT DETECTED NOT DETECTED Final  MRSA  PCR Screening     Status: None   Collection Time: 12/03/16  4:00 PM  Result Value Ref Range Status   MRSA by PCR NEGATIVE NEGATIVE Final    Comment:        The GeneXpert MRSA Assay (FDA approved for NASAL specimens only), is one component of a comprehensive MRSA colonization surveillance program. It is not intended to diagnose MRSA infection nor to guide or monitor treatment for MRSA infections.      Time coordinating discharge: Over 30 minutes  SIGNED:   Birdie Hopes, MD  [facility] 12/05/2016, 10:57 AM Pager   If 7PM-7AM, please contact night-coverage [email or web address] Password [email or web address]

## 2016-12-08 LAB — CULTURE, BLOOD (ROUTINE X 2)
Culture: NO GROWTH
Culture: NO GROWTH
SPECIAL REQUESTS: ADEQUATE
Special Requests: ADEQUATE

## 2016-12-09 ENCOUNTER — Telehealth: Payer: Self-pay | Admitting: *Deleted

## 2016-12-09 NOTE — Telephone Encounter (Signed)
Transition Care Management Follow-up Telephone Call  Per Discharge Summary: Admit date: 12/03/2016 Discharge date: 12/05/2016  Admitted From: Home Disposition: Home  Recommendations for Outpatient Follow-up:  1. Follow up with PCP in 1-2 weeks 2. Please obtain BMP/CBC in one week 3. Please follow up on the following pending results:  Home Health: NA Equipment/Devices:NA  Discharge Condition: Stable CODE STATUS: Full Code Diet recommendation: Diet regular Room service appropriate? Yes; Fluid consistency: Thin Diet - low sodium heart healthy   How have you been since you were released from the hospital? "I'm doing pretty well."   Do you understand why you were in the hospital? yes   Do you understand the discharge instructions? yes   Where were you discharged to? Home  --  Items Reviewed:  Medications reviewed: yes  Allergies reviewed: yes  Dietary changes reviewed: yes  Referrals reviewed: no, none made   Functional Questionnaire:   Activities of Daily Living (ADLs):   She states they are independent in the following: ambulation, bathing and hygiene, feeding, continence, grooming, toileting and dressing States they require assistance with the following: none   Any transportation issues/concerns?: no   Any patient concerns? no   Confirmed importance and date/time of follow-up visits scheduled yes  Provider Appointment booked with Dr. Burnice Logan 12/15/16 @ 11:15am  Confirmed with patient if condition begins to worsen call PCP or go to the ER.  Patient was given the office number and encouraged to call back with question or concerns.  : yes

## 2016-12-09 NOTE — Telephone Encounter (Signed)
Received message from front desk that patient returned my call. Called patient back and left message to return call.

## 2016-12-09 NOTE — Telephone Encounter (Signed)
Unable to reach patient at time of TCM Call. Left message for patient to return call when available.  

## 2016-12-09 NOTE — Telephone Encounter (Signed)
Kim [name] pt returned your call and state that she will be looking at the phone so that she doesn't miss you again.

## 2016-12-15 ENCOUNTER — Ambulatory Visit (INDEPENDENT_AMBULATORY_CARE_PROVIDER_SITE_OTHER): Payer: 59 | Admitting: Internal Medicine

## 2016-12-15 ENCOUNTER — Encounter: Payer: Self-pay | Admitting: Internal Medicine

## 2016-12-15 VITALS — BP 124/60 | HR 79 | Temp 98.0°F | Ht 63.0 in | Wt 126.8 lb

## 2016-12-15 DIAGNOSIS — J9621 Acute and chronic respiratory failure with hypoxia: Secondary | ICD-10-CM

## 2016-12-15 DIAGNOSIS — J449 Chronic obstructive pulmonary disease, unspecified: Secondary | ICD-10-CM | POA: Diagnosis not present

## 2016-12-15 DIAGNOSIS — J441 Chronic obstructive pulmonary disease with (acute) exacerbation: Secondary | ICD-10-CM

## 2016-12-15 NOTE — Progress Notes (Signed)
Subjective:    Patient ID: Kim [name], female    DOB: Jun 19, 1955, 61 y.o.   MRN: [id number]  HPI  Admit date: 12/03/2016 Discharge date: 12/05/2016   Discharge Diagnoses:  Active Problems:   Dyslipidemia   COPD GOLD III    Osteoarthritis   Brachial neuritis or radiculitis   Solitary pulmonary nodule   COPD exacerbation (HCC)   Acute respiratory distress   Chronic respiratory failure (HCC)   HLD (hyperlipidemia)   Acute chronic obstructive pulmonary disease with respiratory distress ([place])  61 year old patient who is seen today for follow-up after a recent hospital discharge and for transitional care management. She has completed antibiotic therapy and also completed prednisone therapy.  3 days ago.  She has been well since her discharge and uses supplemental O2 only sporadically.  She does have home nebulizers and has been faithful with her maintenance bronchodilators.  Recent follow-up 2-D echo revealed normal LV function. She does have a home oximeter and episodes of dyspnea do not correlate well with levels of her oxygen saturation.  She states that blood pressure readings have been quite labile and at times elevated to a significant degree.  She has a history of nonischemic cardiomyopathy ( probable Takotsubo) and remains on low-dose Coreg and lisinopril.  Hospital records reviewed  Past Medical History:  Diagnosis Date  . C8 RADICULOPATHY 06/05/2009  . COPD 08/31/2007  . DEPRESSION 11/19/2006  . DYSLIPIDEMIA 03/12/2009  . MI (mitral incompetence)   . 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 OSTEOARTHRITIS 06/05/2009  . PARESTHESIA 08/24/2007  . TOBACCO ABUSE 01/25/2010     Social History   Social History  . Marital status: Married    Spouse name: N/A  . Number of children: 2  . Years of education: N/A   Occupational History  . Patient care aide-sits with elderly lady    Social History Main Topics  . Smoking status: Former Smoker    Packs/day: 2.00    Years: 38.00    Types: Cigarettes    Quit date: 03/07/2014  . Smokeless tobacco: Never Used  . Alcohol use No  . Drug use: No  . Sexual activity: Not on file   Other Topics Concern  . Not on file   Social History Narrative  . No narrative on file    Past Surgical History:  Procedure Laterality Date  . APPENDECTOMY  1969  . CARDIAC CATHETERIZATION N/A 07/05/2015   Procedure: Left Heart Cath and Coronary Angiography;  Surgeon: Leonie Man, MD;  Location: [facility];  Service: Cardiovascular;  Laterality: N/A;  . CHOLECYSTECTOMY  1989  . collapse lung  1990  . EXPLORATORY LAPAROTOMY  1969    Family History  Problem Relation Age of Onset  . Cancer Mother        lung  . Heart attack Mother   . Hypertension Sister   . Multiple sclerosis Brother   . Diabetes Sister   . Rheum arthritis Sister   . Thyroid disease Sister   . Multiple sclerosis Other   . Alcohol abuse Other   . Arthritis Other   . Diabetes Other   . Kidney disease Other   . Cancer Other        lung,ovarian,skin, uterine  . Stroke Other   . Heart disease Other   . Melanoma Other   . Heart attack Maternal Uncle   . Heart attack Other        NEICE  . Heart attack Other  NEPHEW  . Hypertension Brother   . Stroke Maternal Grandmother   . Colon cancer Neg Hx     Allergies  Allergen Reactions  . Penicillins Hives    Has patient had a PCN reaction causing immediate rash, facial/tongue/throat swelling, SOB or lightheadedness with hypotension: Yes Has patient had a PCN reaction causing severe rash involving mucus membranes or skin necrosis: No Has patient had a PCN reaction that required hospitalization No Has patient had a PCN reaction occurring within the last 10 years: No If all of the above answers are "NO", then may proceed with Cephalosporin use.     Current Outpatient Prescriptions on File Prior to Visit  Medication Sig Dispense Refill  . albuterol (PROVENTIL) (2.5 MG/3ML) 0.083% nebulizer  solution Take 2.5 mg by nebulization every 6 (six) hours as needed for wheezing or shortness of breath.    . ALPRAZolam (XANAX) 0.5 MG tablet TAKE 1/2 TO 1 TABLET BY MOUTH 3 TIMES A DAY 90 tablet 0  . aspirin EC 81 MG EC tablet Take 1 tablet (81 mg total) by mouth daily.    . carvedilol (COREG) 3.125 MG tablet TAKE 1 TABLET BY MOUTH 2 TIMES DAILY WITH MEALS 60 tablet 9  . Cholecalciferol (VITAMIN D3) 1000 units CAPS Take 1 capsule (1,000 Units total) by mouth 2 (two) times daily. 60 capsule 6  . furosemide (LASIX) 40 MG tablet Take 40 mg by mouth daily as needed (for weight gain of 3 lbs).   0  . guaiFENesin (MUCINEX) 600 MG 12 hr tablet Take 2 tablets (1,200 mg total) by mouth 2 (two) times daily as needed for to loosen phlegm. 30 tablet 0  . lisinopril (PRINIVIL,ZESTRIL) 2.5 MG tablet Take 2.5 mg by mouth 2 (two) times daily.  1  . nitroGLYCERIN (NITROSTAT) 0.4 MG SL tablet Place 1 tablet (0.4 mg total) under the tongue every 5 (five) minutes as needed for chest pain. 25 tablet 3  . omeprazole (PRILOSEC) 20 MG capsule Take 20 mg by mouth daily.  0  . PAXIL CR 25 MG 24 hr tablet TAKE 1 TABLET BY MOUTH  EVERY MORNING 90 tablet 1  . pravastatin (PRAVACHOL) 20 MG tablet take 1 tablet by mouth every evening 30 tablet 5  . SYMBICORT 160-4.5 MCG/ACT inhaler inhale 2 puffs by mouth twice a day 10.2 Inhaler 5  . Tiotropium Bromide Monohydrate (SPIRIVA RESPIMAT) 2.5 MCG/ACT AERS Inhale 2 puffs into the lungs daily. (Patient taking differently: Inhale 1 puff into the lungs daily. ) 1 Inhaler 0  . VENTOLIN HFA 108 (90 Base) MCG/ACT inhaler INHALE 2 PUFFS INTO THE LUNGS EVERY 4 HOURS AS NEEDED FOR WHEEZING OR SHORTNESS OF BREATH 18 Inhaler 2  . vitamin B-12 (CYANOCOBALAMIN) 500 MCG tablet Take 500 mcg by mouth 2 (two) times daily.     No current facility-administered medications on file prior to visit.     BP 124/60 (BP Location: Left Arm, Patient Position: Sitting, Cuff Size: Normal)   Pulse 79   Temp 98  F (36.7 C) (Oral)   Ht 5\' 3"  (1.6 m)   Wt 126 lb 12.8 oz (57.5 kg)   SpO2 95%   BMI 22.46 kg/m    Review of Systems  Constitutional: Positive for fatigue.  HENT: Negative for congestion, dental problem, hearing loss, rhinorrhea, sinus pressure, sore throat and tinnitus.   Eyes: Negative for pain, discharge and visual disturbance.  Respiratory: Positive for shortness of breath. Negative for cough.   Cardiovascular: Negative for chest pain,  palpitations and leg swelling.  Gastrointestinal: Negative for abdominal distention, abdominal pain, blood in stool, constipation, diarrhea, nausea and vomiting.  Genitourinary: Negative for difficulty urinating, dysuria, flank pain, frequency, hematuria, pelvic pain, urgency, vaginal bleeding, vaginal discharge and vaginal pain.  Musculoskeletal: Negative for arthralgias, gait problem and joint swelling.  Skin: Negative for rash.  Neurological: Positive for weakness. Negative for dizziness, syncope, speech difficulty, numbness and headaches.  Hematological: Negative for adenopathy.  Psychiatric/Behavioral: Negative for agitation, behavioral problems and dysphoric mood. The patient is nervous/anxious.        Objective:   Physical Exam  Constitutional: She is oriented to person, place, and time. She appears well-developed and well-nourished.  Repeat blood pressure 130/70  Anxious  O2 saturation 95%   HENT:  Head: Normocephalic.  Right Ear: External ear normal.  Left Ear: External ear normal.  Mouth/Throat: Oropharynx is clear and moist.  Eyes: Pupils are equal, round, and reactive to light. Conjunctivae and EOM are normal.  Neck: Normal range of motion. Neck supple. No thyromegaly present.  Cardiovascular: Normal rate, regular rhythm, normal heart sounds and intact distal pulses.   Pulmonary/Chest: Effort normal. No respiratory distress.  Slight diminished breath sounds Rare scattered rhonchi No active wheezing O2 sat ration 95%    Abdominal: Soft. Bowel sounds are normal. She exhibits no mass. There is no tenderness.  Musculoskeletal: Normal range of motion.  Lymphadenopathy:    She has no cervical adenopathy.  Neurological: She is alert and oriented to person, place, and time.  Skin: Skin is warm and dry. No rash noted.  Psychiatric: She has a normal mood and affect. Her behavior is normal.          Assessment & Plan:   COPD exacerbation Anxiety disorder Dyspnea Labile hypertension  Follow-up pulmonary medicine. No change in medical regimen.  Pulmonary may consider taper and discontinuation of beta blocker therapy   Follow-up here 3 months  Neal Oshea Pilar Plate

## 2016-12-15 NOTE — Patient Instructions (Signed)
Please schedule pulmonary follow-up with Dr. Lamonte Sakai  Limit your sodium (Salt) intake  Return in 3 months for follow-up

## 2016-12-18 ENCOUNTER — Other Ambulatory Visit: Payer: Self-pay

## 2016-12-18 MED ORDER — PRAVASTATIN SODIUM 20 MG PO TABS: 20.0000 mg | ORAL_TABLET | Freq: Every evening | ORAL | 11 refills | Status: DC

## 2016-12-25 ENCOUNTER — Encounter: Payer: Self-pay | Admitting: Internal Medicine

## 2016-12-29 ENCOUNTER — Telehealth: Payer: Self-pay | Admitting: Internal Medicine

## 2016-12-29 NOTE — Telephone Encounter (Signed)
Okay for refill?  

## 2016-12-29 NOTE — Telephone Encounter (Signed)
° ° ° °  The below med is showing discontinued and is not longer showing on her med list, ot is calling for a refill     Pt request refill of the following:  HYDROCODONE   Phamacy:

## 2016-12-29 NOTE — Telephone Encounter (Signed)
Last seen 12/15/16 Last filled 11/20/16, #90  Please advise if okay to refill Hydrocodone 7.5mg -325mg . Thanks.

## 2017-01-02 ENCOUNTER — Other Ambulatory Visit: Payer: Self-pay | Admitting: Physician Assistant

## 2017-01-06 ENCOUNTER — Other Ambulatory Visit: Payer: Self-pay

## 2017-01-06 ENCOUNTER — Other Ambulatory Visit: Payer: Self-pay | Admitting: Internal Medicine

## 2017-01-06 MED ORDER — HYDROCODONE-ACETAMINOPHEN 7.5-325 MG PO TABS: ORAL_TABLET | ORAL | 0 refills | Status: DC

## 2017-01-06 NOTE — Telephone Encounter (Signed)
I do not know why epic will not let me refill this medication. It said that an encounter was already open in the future .   Please advise

## 2017-01-07 ENCOUNTER — Ambulatory Visit (INDEPENDENT_AMBULATORY_CARE_PROVIDER_SITE_OTHER): Payer: 59

## 2017-01-07 DIAGNOSIS — Z23 Encounter for immunization: Secondary | ICD-10-CM | POA: Diagnosis not present

## 2017-01-08 ENCOUNTER — Encounter: Payer: Self-pay | Admitting: Internal Medicine

## 2017-01-08 ENCOUNTER — Ambulatory Visit (INDEPENDENT_AMBULATORY_CARE_PROVIDER_SITE_OTHER): Payer: 59 | Admitting: Internal Medicine

## 2017-01-08 VITALS — BP 100/68 | HR 82 | Temp 99.0°F | Wt 127.0 lb

## 2017-01-08 DIAGNOSIS — J9611 Chronic respiratory failure with hypoxia: Secondary | ICD-10-CM

## 2017-01-08 DIAGNOSIS — I5041 Acute combined systolic (congestive) and diastolic (congestive) heart failure: Secondary | ICD-10-CM | POA: Diagnosis not present

## 2017-01-08 DIAGNOSIS — J449 Chronic obstructive pulmonary disease, unspecified: Secondary | ICD-10-CM

## 2017-01-08 DIAGNOSIS — J441 Chronic obstructive pulmonary disease with (acute) exacerbation: Secondary | ICD-10-CM

## 2017-01-08 MED ORDER — PREDNISONE 20 MG PO TABS: 20.0000 mg | ORAL_TABLET | Freq: Two times a day (BID) | ORAL | 0 refills | Status: DC

## 2017-01-08 NOTE — Progress Notes (Signed)
Subjective:    Patient ID: Kim [name], female    DOB: [birth year], 61 y.o.   MRN: [id number]  HPI  61 year old patient who has a history of COPD Gold's III.  Marland Kitchen  She also has a history of combined systolic and diastolic heart failure.  She was hospitalized twice this past summer for acute on chronic respiratory failure. For the past 24 hours she has had increasing cough, chills and shortness of breath. She has been very compliant with her maximum maintenance pulmonary medications.  She has added Mucinex twice daily.  The patient is on hydrocodone daily due to back pain.  She also states that the medication helps her dyspnea.  The stop act of  2017 discussed  Past Medical History:  Diagnosis Date  . C8 RADICULOPATHY 06/05/2009  . COPD 08/31/2007  . DEPRESSION 11/19/2006  . DYSLIPIDEMIA 03/12/2009  . MI (mitral incompetence)   . 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 OSTEOARTHRITIS 06/05/2009  . PARESTHESIA 08/24/2007  . TOBACCO ABUSE 01/25/2010     Social History   Social History  . Marital status: Married    Spouse name: N/A  . Number of children: 2  . Years of education: N/A   Occupational History  . Patient care aide-sits with elderly lady    Social History Main Topics  . Smoking status: Former Smoker    Packs/day: 2.00    Years: 38.00    Types: Cigarettes    Quit date: 03/07/2014  . Smokeless tobacco: Never Used  . Alcohol use No  . Drug use: No  . Sexual activity: Not on file   Other Topics Concern  . Not on file   Social History Narrative  . No narrative on file    Past Surgical History:  Procedure Laterality Date  . APPENDECTOMY  1969  . CARDIAC CATHETERIZATION N/A 07/05/2015   Procedure: Left Heart Cath and Coronary Angiography;  Surgeon: Leonie Man, MD;  Location: [facility];  Service: Cardiovascular;  Laterality: N/A;  . CHOLECYSTECTOMY  1989  . collapse lung  1990  . EXPLORATORY LAPAROTOMY  1969    Family History  Problem Relation Age of Onset   . Cancer Mother        lung  . Heart attack Mother   . Hypertension Sister   . Multiple sclerosis Brother   . Diabetes Sister   . Rheum arthritis Sister   . Thyroid disease Sister   . Multiple sclerosis Other   . Alcohol abuse Other   . Arthritis Other   . Diabetes Other   . Kidney disease Other   . Cancer Other        lung,ovarian,skin, uterine  . Stroke Other   . Heart disease Other   . Melanoma Other   . Heart attack Maternal Uncle   . Heart attack Other        NEICE  . Heart attack Other        NEPHEW  . Hypertension Brother   . Stroke Maternal Grandmother   . Colon cancer Neg Hx     Allergies  Allergen Reactions  . Penicillins Hives    Has patient had a PCN reaction causing immediate rash, facial/tongue/throat swelling, SOB or lightheadedness with hypotension: Yes Has patient had a PCN reaction causing severe rash involving mucus membranes or skin necrosis: No Has patient had a PCN reaction that required hospitalization No Has patient had a PCN reaction occurring within the last 10 years: No If all of  the above answers are "NO", then may proceed with Cephalosporin use.     Current Outpatient Prescriptions on File Prior to Visit  Medication Sig Dispense Refill  . albuterol (PROVENTIL) (2.5 MG/3ML) 0.083% nebulizer solution Take 2.5 mg by nebulization every 6 (six) hours as needed for wheezing or shortness of breath.    . ALPRAZolam (XANAX) 0.5 MG tablet TAKE 1/2 TO 1 TABLET BY MOUTH 3 TIMES A DAY 90 tablet 0  . aspirin EC 81 MG EC tablet Take 1 tablet (81 mg total) by mouth daily.    . carvedilol (COREG) 3.125 MG tablet TAKE 1 TABLET BY MOUTH 2 TIMES DAILY WITH MEALS 60 tablet 9  . Cholecalciferol (VITAMIN D3) 1000 units CAPS Take 1 capsule (1,000 Units total) by mouth 2 (two) times daily. 60 capsule 6  . furosemide (LASIX) 40 MG tablet Take 40 mg by mouth daily as needed (for weight gain of 3 lbs).   0  . guaiFENesin (MUCINEX) 600 MG 12 hr tablet Take 2 tablets  (1,200 mg total) by mouth 2 (two) times daily as needed for to loosen phlegm. 30 tablet 0  . HYDROcodone-acetaminophen (NORCO) 7.5-325 MG tablet TK 1 T PO Q 4 H PRN FOR MODERATE PAIN 90 tablet 0  . lisinopril (PRINIVIL,ZESTRIL) 2.5 MG tablet Take 2.5 mg by mouth 2 (two) times daily.  1  . nitroGLYCERIN (NITROSTAT) 0.4 MG SL tablet Place 1 tablet (0.4 mg total) under the tongue every 5 (five) minutes as needed for chest pain. 25 tablet 3  . omeprazole (PRILOSEC) 20 MG capsule Take 20 mg by mouth daily.  0  . PAXIL CR 25 MG 24 hr tablet TAKE 1 TABLET BY MOUTH  EVERY MORNING 90 tablet 1  . pravastatin (PRAVACHOL) 20 MG tablet Take 1 tablet (20 mg total) by mouth every evening. 30 tablet 11  . SYMBICORT 160-4.5 MCG/ACT inhaler inhale 2 puffs by mouth twice a day 10.2 Inhaler 5  . Tiotropium Bromide Monohydrate (SPIRIVA RESPIMAT) 2.5 MCG/ACT AERS Inhale 2 puffs into the lungs daily. (Patient taking differently: Inhale 1 puff into the lungs daily. ) 1 Inhaler 0  . VENTOLIN HFA 108 (90 Base) MCG/ACT inhaler INHALE 2 PUFFS INTO THE LUNGS EVERY 4 HOURS AS NEEDED FOR WHEEZING OR SHORTNESS OF BREATH 18 Inhaler 2  . vitamin B-12 (CYANOCOBALAMIN) 500 MCG tablet Take 500 mcg by mouth 2 (two) times daily.     No current facility-administered medications on file prior to visit.     BP 100/68 (BP Location: Left Arm, Patient Position: Sitting, Cuff Size: Normal)   Pulse 82   Temp 99 F (37.2 C) (Oral)   Wt 127 lb (57.6 kg)   SpO2 95%   BMI 22.50 kg/m     Review of Systems  Constitutional: Positive for fatigue.  HENT: Negative for congestion, dental problem, hearing loss, rhinorrhea, sinus pressure, sore throat and tinnitus.   Eyes: Negative for pain, discharge and visual disturbance.  Respiratory: Positive for cough and shortness of breath.   Cardiovascular: Negative for chest pain, palpitations and leg swelling.  Gastrointestinal: Negative for abdominal distention, abdominal pain, blood in stool,  constipation, diarrhea, nausea and vomiting.  Genitourinary: Negative for difficulty urinating, dysuria, flank pain, frequency, hematuria, pelvic pain, urgency, vaginal bleeding, vaginal discharge and vaginal pain.  Musculoskeletal: Negative for arthralgias, gait problem and joint swelling.  Skin: Negative for rash.  Neurological: Positive for weakness. Negative for dizziness, syncope, speech difficulty, numbness and headaches.  Hematological: Negative for adenopathy.  Psychiatric/Behavioral:  Negative for agitation, behavioral problems and dysphoric mood. The patient is not nervous/anxious.        Objective:   Physical Exam  Constitutional: She is oriented to person, place, and time. She appears well-developed and well-nourished.  HENT:  Head: Normocephalic.  Right Ear: External ear normal.  Left Ear: External ear normal.  Mouth/Throat: Oropharynx is clear and moist.  Eyes: Pupils are equal, round, and reactive to light. Conjunctivae and EOM are normal.  Neck: Normal range of motion. Neck supple. No thyromegaly present.  Cardiovascular: Normal rate, regular rhythm, normal heart sounds and intact distal pulses.   Pulmonary/Chest: Effort normal.  Scattered coarse rhonchi Bowel sounds generally diminished No active wheezing    Abdominal: Soft. Bowel sounds are normal. She exhibits no mass. There is no tenderness.  Musculoskeletal: Normal range of motion.  Lymphadenopathy:    She has no cervical adenopathy.  Neurological: She is alert and oriented to person, place, and time.  Skin: Skin is warm and dry. No rash noted.  Psychiatric: She has a normal mood and affect. Her behavior is normal.          Assessment & Plan:   Exacerbation COPD.  Will continue hydration.  Add Mucinex.  Will treat with short-term prednisone 20 mg twice a day.  Pulmonary follow-up.  Encouraged Chronic hydrocodone use.  Pain management.  Follow-up 3 months.  [name]

## 2017-01-08 NOTE — Patient Instructions (Signed)
Acute bronchitis symptoms for less than 10 days are generally not helped by antibiotics.  Take over-the-counter expectorants and cough medications such as  Mucinex DM.  Call if there is no improvement in 5 to 7 days or if  you develop worsening cough, fever, or new symptoms, such as shortness of breath or chest pain.  Prednisone 20 mg twice daily for 6 days  Please schedule pulmonary follow-up

## 2017-01-12 ENCOUNTER — Encounter: Payer: Self-pay | Admitting: Pulmonary Disease

## 2017-01-12 ENCOUNTER — Ambulatory Visit (INDEPENDENT_AMBULATORY_CARE_PROVIDER_SITE_OTHER): Payer: 59 | Admitting: Pulmonary Disease

## 2017-01-12 ENCOUNTER — Other Ambulatory Visit: Payer: 59

## 2017-01-12 VITALS — BP 136/76 | HR 93 | Ht 63.0 in | Wt 128.2 lb

## 2017-01-12 DIAGNOSIS — J449 Chronic obstructive pulmonary disease, unspecified: Secondary | ICD-10-CM

## 2017-01-12 DIAGNOSIS — J441 Chronic obstructive pulmonary disease with (acute) exacerbation: Secondary | ICD-10-CM

## 2017-01-12 DIAGNOSIS — J9611 Chronic respiratory failure with hypoxia: Secondary | ICD-10-CM | POA: Diagnosis not present

## 2017-01-12 MED ORDER — BENZONATATE 100 MG PO CAPS: 100.0000 mg | ORAL_CAPSULE | Freq: Three times a day (TID) | ORAL | 0 refills | Status: DC | PRN

## 2017-01-12 MED ORDER — PREDNISONE 10 MG (21) PO TBPK: ORAL_TABLET | ORAL | 0 refills | Status: DC

## 2017-01-12 MED ORDER — SPACER/AERO CHAMBER MOUTHPIECE MISC
1.0000 | 0 refills | Status: DC
Start: 2017-01-12 — End: 2022-10-09

## 2017-01-12 MED ORDER — METHYLPREDNISOLONE ACETATE 80 MG/ML IJ SUSP: 80.0000 mg | Freq: Once | INTRAMUSCULAR | Status: DC

## 2017-01-12 MED ORDER — IPRATROPIUM-ALBUTEROL 0.5-2.5 (3) MG/3ML IN SOLN: 3.0000 mL | RESPIRATORY_TRACT | 3 refills | Status: DC | PRN

## 2017-01-12 NOTE — Patient Instructions (Addendum)
   Continue using your Symbicort but use it with the Spacer we are giving you today.  Stop using your Spiriva while you are using the Duonebs we are prescribing today.  Use the Duoneb I am sending to your pharmacy every 4 hours as needed. I would recommend doing a treatment at least 3 times daily until you are getting better.  Remember the Daliresp pill can give you some diarrhea. Don't be alarmed unless it is significant in volume.  Take the Prednisone we are prescribing you according to the following taper:  6 pills daily (60mg ) for 3 days, then  5 pills daily (50mg ) for 3 days, then  4 pills daily (40mg ) for 3 days, then  3 pills daily (30mg ) for 3 days, then   2 pills daily (20mg ) for 3 days, then  1 pill daily (10mg ) for 3 days & stop  We will see you back in 1 week but if you feel you're getting worse you will likely need to be hospitalized so go to the closest emergency department.   Keep an eye on your oxygen and blood pressure.   TESTS ORDERED: 1. Serum Alpha-1 Antitrypsin Phenotype today

## 2017-01-12 NOTE — Progress Notes (Addendum)
Subjective:    Patient ID: Kim [name], female    DOB: 1956-03-21, 61 y.o.   MRN: [id number]\  [facility].:  Acute visit for Dyspnea with known Severe COPD & Chronic Hypoxic Respiratory Failure.   HPI Patient followed by Dr. Lamonte Sakai. Last visit July 2018. Electronic medical record reviewed. Patient hospitalized and subsequently discharged on 12/05/16 with several day history of increasing shortness of breath based on discharge summary. At that time the patient had not been using her prescribed oxygen therapy. Transiently was on noninvasive positive pressure ventilation. Also note patient was evaluated in July in the emergency department for a COPD exacerbation.  Dyspnea:  She reports she had breathing problems starting in May of this year. She has had intermittent improvements in her breathing lasting for a week or so at a time. She reports she feels the Prednisone is the only thing that helps her breathe. She reports she was recently exposed to diesel fumes that made her breathing worse starting last Wednesday. She was seen on Thursday & started on Prednisone 40mg  daily for 5 days without significant improvement. She does admit that she has panic attacks with her coughing. Xanax seems to help. No sick contacts.   Severe COPD:  She is coughing more and reports her mucus is "thick & white". She is wheezing more. She is taking Mucinex regularly as well. She reports she is adherent to her Symbicort & Spiriva as prescribed. She is using her nebulizer 3-4 times daily for her rescue medication.   Chronic hypoxic respiratory failure:  She reports she only uses oxygen as needed. She does use it at night when she is sleeping at home. She does keep her pulse oximeter at home. She is prescribed oxygen at 2 L/m with exertion.   Review of Systems She has noticed that her blood pressure can fluctuate. She denies any fever or chills. She reports intermittent sweats from menopause & steroids. She does report some  tightness in her chest as well as heaviness. No frank chest pain. She denies any sinus congestion or sore throat. Denies any reflux or dyspepsia.   Allergies  Allergen Reactions  . Penicillins Hives    Has patient had a PCN reaction causing immediate rash, facial/tongue/throat swelling, SOB or lightheadedness with hypotension: Yes Has patient had a PCN reaction causing severe rash involving mucus membranes or skin necrosis: No Has patient had a PCN reaction that required hospitalization No Has patient had a PCN reaction occurring within the last 10 years: No If all of the above answers are "NO", then may proceed with Cephalosporin use.     Current Outpatient Prescriptions on File Prior to Visit  Medication Sig Dispense Refill  . albuterol (PROVENTIL) (2.5 MG/3ML) 0.083% nebulizer solution Take 2.5 mg by nebulization every 6 (six) hours as needed for wheezing or shortness of breath.    . ALPRAZolam (XANAX) 0.5 MG tablet TAKE 1/2 TO 1 TABLET BY MOUTH 3 TIMES A DAY 90 tablet 0  . aspirin EC 81 MG EC tablet Take 1 tablet (81 mg total) by mouth daily.    . carvedilol (COREG) 3.125 MG tablet TAKE 1 TABLET BY MOUTH 2 TIMES DAILY WITH MEALS 60 tablet 9  . Cholecalciferol (VITAMIN D3) 1000 units CAPS Take 1 capsule (1,000 Units total) by mouth 2 (two) times daily. 60 capsule 6  . furosemide (LASIX) 40 MG tablet Take 40 mg by mouth daily as needed (for weight gain of 3 lbs).   0  . guaiFENesin ([place])  600 MG 12 hr tablet Take 2 tablets (1,200 mg total) by mouth 2 (two) times daily as needed for to loosen phlegm. 30 tablet 0  . HYDROcodone-acetaminophen (NORCO) 7.5-325 MG tablet TK 1 T PO Q 4 H PRN FOR MODERATE PAIN 90 tablet 0  . lisinopril (PRINIVIL,ZESTRIL) 2.5 MG tablet Take 2.5 mg by mouth 2 (two) times daily.  1  . nitroGLYCERIN (NITROSTAT) 0.4 MG SL tablet Place 1 tablet (0.4 mg total) under the tongue every 5 (five) minutes as needed for chest pain. 25 tablet 3  . omeprazole (PRILOSEC) 20 MG  capsule Take 20 mg by mouth daily.  0  . PAXIL CR 25 MG 24 hr tablet TAKE 1 TABLET BY MOUTH  EVERY MORNING 90 tablet 1  . pravastatin (PRAVACHOL) 20 MG tablet Take 1 tablet (20 mg total) by mouth every evening. 30 tablet 11  . predniSONE (DELTASONE) 20 MG tablet Take 1 tablet (20 mg total) by mouth 2 (two) times daily with a meal. 12 tablet 0  . SYMBICORT 160-4.5 MCG/ACT inhaler inhale 2 puffs by mouth twice a day 10.2 Inhaler 5  . Tiotropium Bromide Monohydrate (SPIRIVA RESPIMAT) 2.5 MCG/ACT AERS Inhale 2 puffs into the lungs daily. (Patient taking differently: Inhale 1 puff into the lungs daily. ) 1 Inhaler 0  . VENTOLIN HFA 108 (90 Base) MCG/ACT inhaler INHALE 2 PUFFS INTO THE LUNGS EVERY 4 HOURS AS NEEDED FOR WHEEZING OR SHORTNESS OF BREATH 18 Inhaler 2  . vitamin B-12 (CYANOCOBALAMIN) 500 MCG tablet Take 500 mcg by mouth 2 (two) times daily.     No current facility-administered medications on file prior to visit.     Past Medical History:  Diagnosis Date  . C8 RADICULOPATHY 06/05/2009  . COPD 08/31/2007  . DEPRESSION 11/19/2006  . DYSLIPIDEMIA 03/12/2009  . MI (mitral incompetence)   . 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 OSTEOARTHRITIS 06/05/2009  . PARESTHESIA 08/24/2007  . TOBACCO ABUSE 01/25/2010    Past Surgical History:  Procedure Laterality Date  . APPENDECTOMY  1969  . CARDIAC CATHETERIZATION N/A 07/05/2015   Procedure: Left Heart Cath and Coronary Angiography;  Surgeon: Leonie Man, MD;  Location: [facility];  Service: Cardiovascular;  Laterality: N/A;  . CHOLECYSTECTOMY  1989  . collapse lung  1990  . EXPLORATORY LAPAROTOMY  1969    Family History  Problem Relation Age of Onset  . Cancer Mother        lung  . Heart attack Mother   . Hypertension Sister   . Multiple sclerosis Brother   . Diabetes Sister   . Rheum arthritis Sister   . Thyroid disease Sister   . Multiple sclerosis Other   . Alcohol abuse Other   . Arthritis Other   . Diabetes Other   . Kidney  disease Other   . Cancer Other        lung,ovarian,skin, uterine  . Stroke Other   . Heart disease Other   . Melanoma Other   . Heart attack Maternal Uncle   . Heart attack Other        NEICE  . Heart attack Other        NEPHEW  . Hypertension Brother   . Stroke Maternal Grandmother   . Colon cancer Neg Hx     Social History   Social History  . Marital status: Married    Spouse name: N/A  . Number of children: 2  . Years of education: N/A   Occupational History  .  Patient care aide-sits with elderly lady    Social History Main Topics  . Smoking status: Former Smoker    Packs/day: 2.00    Years: 38.00    Types: Cigarettes    Quit date: 03/07/2014  . Smokeless tobacco: Never Used  . Alcohol use No  . Drug use: No  . Sexual activity: Not Asked   Other Topics Concern  . None   Social History Narrative   [facility] (01/12/17):   Originally from [facility]. Has lived in [place] for 17 years. Married. Has 2 dogs currently. No mold and only 1 indoor plant. Had her home professionally cleaned a month ago. No bird or hot tub exposure. 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       Objective:   Physical Exam BP 136/76 (BP Location: Left Arm, Cuff Size: Normal)   Pulse 93   Ht 5\' 3"  (1.6 m)   Wt 128 lb 4 oz (58.2 kg)   SpO2 (!) 86%   BMI 22.72 kg/m  General:  Awake. Alert. No distress. Accompanied by husband today.  Integument:  Warm & dry. No rash on exposed skin. No bruising on exposed skin. Extremities:  No cyanosis.  HEENT:  No oral ulcers. Moist mucous membranes. No scleral icterus Cardiovascular:  Regular rate. Unable to appreciate JVD. No edema.  Pulmonary:  Patient does have reasonable aeration in her lung bases. Wheezing bilaterally for less than 50% of exhalation phase. Forceful frequent coughing spells witnessed. Mild to moderately increased work of breathing. Abdomen: Soft. Normal bowel sounds. Nondistended.  Musculoskeletal:  Normal  bulk and tone. No joint deformity or effusion appreciated. Neurological:  Cranial nerves 2-12 grossly in tact. No meningismus. Moving all 4 extremities equally.   PFT 11/16/14: FVC 2.00 L (63%) FEV1 1.00 L (41%) FEV1/FVC 0.50 FEF 25-75 0.42 L (18%) negative bronchodilator response TLC 5.06 L (103%) RV 140% DLCO uncorrected 51%    Assessment & Plan:  61 y.o. female with severe COPD based on spirometry from 2016. Her lung volumes from that time indicated air trapping suspicious for underlying emphysema. I believe her symptoms have been precipitated by her exposure to diesel fumes. She has no other symptoms that would suggest an environmental exposure or allergies as a potential cause. Given her history of believe she may benefit from the addition of Daliresp to her regimen. I did caution the patient that if she did not begin to improve clinically or had any further signs of worsening she should seek immediate medical attention that would likely require hospitalization for treatment.  1. Severe COPD with acute exacerbation: Prescribing patient DuoNeb to use in place of Spiriva. Continuing Symbicort with spacer. Patient given sample of Daliresp to try for the next 7 days. Depo-Medrol 80 mg IM 1 administered today & starting prolonged steroid taper. Initiating cough suppression with Tessalon Perles. Screening for alpha-1 antitrypsin deficiency today. 2. Chronic hypoxic respiratory: Continuing on oxygen as previously prescribed. Recommended the patient monitor her oxygen closely. 3. Follow-up: Return to clinic in 1 week.  Sonia Baller Ashok Cordia, M.D. [facility] Pager:  [phone] After 7pm or if no response, call [phone] 3:45 PM 01/12/17

## 2017-01-13 ENCOUNTER — Telehealth: Payer: Self-pay | Admitting: Pulmonary Disease

## 2017-01-13 MED ORDER — PREDNISONE 10 MG PO TABS: ORAL_TABLET | ORAL | 0 refills | Status: DC

## 2017-01-13 MED ORDER — ROFLUMILAST 500 MCG PO TABS: 500.0000 ug | ORAL_TABLET | Freq: Every day | ORAL | 0 refills | Status: DC

## 2017-01-13 NOTE — Telephone Encounter (Signed)
pred taper was called in incorrectly at yesterday's OV.  Pt was also not given daliresp sample as documented yesterday.  Corrected rx printed and given to pt, and daliresp sample given.  Apologized to pt for inconvenience.  Nothing further needed at this time.

## 2017-01-14 ENCOUNTER — Emergency Department (HOSPITAL_COMMUNITY): Payer: 59

## 2017-01-14 ENCOUNTER — Inpatient Hospital Stay (HOSPITAL_COMMUNITY)
Admission: EM | Admit: 2017-01-14 | Discharge: 2017-01-18 | DRG: 190 | Disposition: A | Discharge status: Home / Self Care | Payer: 59 | Attending: Internal Medicine | Admitting: Internal Medicine

## 2017-01-14 ENCOUNTER — Encounter (HOSPITAL_COMMUNITY): Payer: Self-pay | Admitting: Neurology

## 2017-01-14 DIAGNOSIS — G8929 Other chronic pain: Secondary | ICD-10-CM | POA: Diagnosis present

## 2017-01-14 DIAGNOSIS — J9611 Chronic respiratory failure with hypoxia: Secondary | ICD-10-CM | POA: Diagnosis not present

## 2017-01-14 DIAGNOSIS — G4730 Sleep apnea, unspecified: Secondary | ICD-10-CM

## 2017-01-14 DIAGNOSIS — J441 Chronic obstructive pulmonary disease with (acute) exacerbation: Principal | ICD-10-CM | POA: Diagnosis present

## 2017-01-14 DIAGNOSIS — E785 Hyperlipidemia, unspecified: Secondary | ICD-10-CM | POA: Diagnosis not present

## 2017-01-14 DIAGNOSIS — Z79899 Other long term (current) drug therapy: Secondary | ICD-10-CM

## 2017-01-14 DIAGNOSIS — Z87891 Personal history of nicotine dependence: Secondary | ICD-10-CM

## 2017-01-14 DIAGNOSIS — F329 Major depressive disorder, single episode, unspecified: Secondary | ICD-10-CM | POA: Diagnosis present

## 2017-01-14 DIAGNOSIS — D72829 Elevated white blood cell count, unspecified: Secondary | ICD-10-CM | POA: Diagnosis not present

## 2017-01-14 DIAGNOSIS — J9620 Acute and chronic respiratory failure, unspecified whether with hypoxia or hypercapnia: Secondary | ICD-10-CM | POA: Diagnosis present

## 2017-01-14 DIAGNOSIS — T380X5A Adverse effect of glucocorticoids and synthetic analogues, initial encounter: Secondary | ICD-10-CM | POA: Diagnosis not present

## 2017-01-14 DIAGNOSIS — R609 Edema, unspecified: Secondary | ICD-10-CM

## 2017-01-14 DIAGNOSIS — Z880 Allergy status to penicillin: Secondary | ICD-10-CM

## 2017-01-14 DIAGNOSIS — F419 Anxiety disorder, unspecified: Secondary | ICD-10-CM | POA: Diagnosis present

## 2017-01-14 DIAGNOSIS — I2510 Atherosclerotic heart disease of native coronary artery without angina pectoris: Secondary | ICD-10-CM | POA: Diagnosis present

## 2017-01-14 DIAGNOSIS — I10 Essential (primary) hypertension: Secondary | ICD-10-CM | POA: Diagnosis present

## 2017-01-14 DIAGNOSIS — J9610 Chronic respiratory failure, unspecified whether with hypoxia or hypercapnia: Secondary | ICD-10-CM | POA: Diagnosis present

## 2017-01-14 DIAGNOSIS — J9621 Acute and chronic respiratory failure with hypoxia: Secondary | ICD-10-CM | POA: Diagnosis present

## 2017-01-14 LAB — CBC WITH DIFFERENTIAL/PLATELET
BASOS ABS: 0 10*3/uL (ref 0.0–0.1)
Basophils Relative: 0 %
EOS PCT: 0 %
Eosinophils Absolute: 0 10*3/uL (ref 0.0–0.7)
HCT: 43.6 % (ref 36.0–46.0)
Hemoglobin: 14 g/dL (ref 12.0–15.0)
Lymphocytes Relative: 39 %
Lymphs Abs: 7.5 10*3/uL — ABNORMAL HIGH (ref 0.7–4.0)
MCH: 30.8 pg (ref 26.0–34.0)
MCHC: 32.1 g/dL (ref 30.0–36.0)
MCV: 95.8 fL (ref 78.0–100.0)
MONO ABS: 1.5 10*3/uL — AB (ref 0.1–1.0)
MONOS PCT: 8 %
NEUTROS PCT: 53 %
Neutro Abs: 10.2 10*3/uL — ABNORMAL HIGH (ref 1.7–7.7)
PLATELETS: 324 10*3/uL (ref 150–400)
RBC: 4.55 MIL/uL (ref 3.87–5.11)
RDW: 14.7 % (ref 11.5–15.5)
WBC: 19.2 10*3/uL — AB (ref 4.0–10.5)

## 2017-01-14 LAB — BASIC METABOLIC PANEL
Anion gap: 11 (ref 5–15)
BUN: 26 mg/dL — ABNORMAL HIGH (ref 6–20)
CHLORIDE: 102 mmol/L (ref 101–111)
CO2: 27 mmol/L (ref 22–32)
Calcium: 9 mg/dL (ref 8.9–10.3)
Creatinine, Ser: 0.95 mg/dL (ref 0.44–1.00)
GFR calc non Af Amer: >60 mL/min (ref >60)
Glucose, Bld: 103 mg/dL — ABNORMAL HIGH (ref 65–99)
POTASSIUM: 3.8 mmol/L (ref 3.5–5.1)
SODIUM: 140 mmol/L (ref 135–145)

## 2017-01-14 LAB — I-STAT VENOUS BLOOD GAS, ED
Acid-Base Excess: 7 mmol/L — ABNORMAL HIGH (ref 0.0–2.0)
BICARBONATE: 32.9 mmol/L — AB (ref 20.0–28.0)
O2 Saturation: 75 %
PH VEN: 7.402 (ref 7.250–7.430)
TCO2: 34 mmol/L — ABNORMAL HIGH (ref 22–32)
pCO2, Ven: 52.8 mmHg (ref 44.0–60.0)
pO2, Ven: 41 mmHg (ref 32.0–45.0)

## 2017-01-14 LAB — RESPIRATORY PANEL BY PCR
Adenovirus: NOT DETECTED
BORDETELLA PERTUSSIS-RVPCR: NOT DETECTED
CORONAVIRUS OC43-RVPPCR: NOT DETECTED
Chlamydophila pneumoniae: NOT DETECTED
Coronavirus 229E: NOT DETECTED
Coronavirus HKU1: NOT DETECTED
Coronavirus NL63: NOT DETECTED
INFLUENZA B-RVPPCR: NOT DETECTED
Influenza A: NOT DETECTED
METAPNEUMOVIRUS-RVPPCR: NOT DETECTED
Mycoplasma pneumoniae: NOT DETECTED
PARAINFLUENZA VIRUS 1-RVPPCR: NOT DETECTED
PARAINFLUENZA VIRUS 2-RVPPCR: NOT DETECTED
PARAINFLUENZA VIRUS 3-RVPPCR: NOT DETECTED
PARAINFLUENZA VIRUS 4-RVPPCR: NOT DETECTED
RESPIRATORY SYNCYTIAL VIRUS-RVPPCR: NOT DETECTED
RHINOVIRUS / ENTEROVIRUS - RVPPCR: DETECTED — AB

## 2017-01-14 LAB — I-STAT TROPONIN, ED: Troponin i, poc: 0.04 ng/mL (ref 0.00–0.08)

## 2017-01-14 LAB — PROCALCITONIN: Procalcitonin: <0.1 ng/mL

## 2017-01-14 MED ORDER — MOMETASONE FURO-FORMOTEROL FUM 200-5 MCG/ACT IN AERO
2.0000 | INHALATION_SPRAY | Freq: Two times a day (BID) | RESPIRATORY_TRACT | Status: DC
  Administered 2017-01-15 – 2017-01-17 (×5): 2 via RESPIRATORY_TRACT
  Filled 2017-01-14: qty 8.8

## 2017-01-14 MED ORDER — ALPRAZOLAM 0.5 MG PO TABS
0.5000 mg | ORAL_TABLET | Freq: Three times a day (TID) | ORAL | Status: DC | PRN
  Administered 2017-01-14: 0.5 mg via ORAL
  Filled 2017-01-14: qty 1

## 2017-01-14 MED ORDER — LISINOPRIL 5 MG PO TABS
2.5000 mg | ORAL_TABLET | Freq: Two times a day (BID) | ORAL | Status: DC
  Administered 2017-01-14 – 2017-01-18 (×8): 2.5 mg via ORAL
  Filled 2017-01-14 (×9): qty 1

## 2017-01-14 MED ORDER — IPRATROPIUM-ALBUTEROL 0.5-2.5 (3) MG/3ML IN SOLN
3.0000 mL | RESPIRATORY_TRACT | Status: DC
  Administered 2017-01-14 – 2017-01-15 (×7): 3 mL via RESPIRATORY_TRACT
  Filled 2017-01-14 (×7): qty 3

## 2017-01-14 MED ORDER — TIOTROPIUM BROMIDE MONOHYDRATE 18 MCG IN CAPS
1.0000 | ORAL_CAPSULE | Freq: Every day | RESPIRATORY_TRACT | Status: DC
  Filled 2017-01-14: qty 5

## 2017-01-14 MED ORDER — PANTOPRAZOLE SODIUM 40 MG PO TBEC
40.0000 mg | DELAYED_RELEASE_TABLET | Freq: Every day | ORAL | Status: DC
  Administered 2017-01-14 – 2017-01-18 (×5): 40 mg via ORAL
  Filled 2017-01-14 (×5): qty 1

## 2017-01-14 MED ORDER — ONDANSETRON HCL 4 MG/2ML IJ SOLN: 4.0000 mg | Freq: Four times a day (QID) | INTRAMUSCULAR | Status: DC | PRN

## 2017-01-14 MED ORDER — ALBUTEROL SULFATE (2.5 MG/3ML) 0.083% IN NEBU
5.0000 mg | INHALATION_SOLUTION | Freq: Once | RESPIRATORY_TRACT | Status: AC
  Administered 2017-01-14: 5 mg via RESPIRATORY_TRACT
  Filled 2017-01-14: qty 6

## 2017-01-14 MED ORDER — GUAIFENESIN ER 600 MG PO TB12
1200.0000 mg | ORAL_TABLET | Freq: Two times a day (BID) | ORAL | Status: DC
  Administered 2017-01-14 – 2017-01-18 (×9): 1200 mg via ORAL
  Filled 2017-01-14 (×9): qty 2

## 2017-01-14 MED ORDER — FUROSEMIDE 40 MG PO TABS: 40.0000 mg | ORAL_TABLET | Freq: Every day | ORAL | Status: DC | PRN

## 2017-01-14 MED ORDER — LEVOFLOXACIN IN D5W 500 MG/100ML IV SOLN
500.0000 mg | INTRAVENOUS | Status: DC
  Administered 2017-01-14: 500 mg via INTRAVENOUS
  Filled 2017-01-14 (×2): qty 100

## 2017-01-14 MED ORDER — PAROXETINE HCL ER 25 MG PO TB24
25.0000 mg | ORAL_TABLET | Freq: Every morning | ORAL | Status: DC
  Administered 2017-01-17 – 2017-01-18 (×2): 25 mg via ORAL
  Filled 2017-01-14 (×5): qty 1

## 2017-01-14 MED ORDER — PRAVASTATIN SODIUM 20 MG PO TABS
20.0000 mg | ORAL_TABLET | Freq: Every evening | ORAL | Status: DC
  Administered 2017-01-14 – 2017-01-17 (×4): 20 mg via ORAL
  Filled 2017-01-14 (×4): qty 1

## 2017-01-14 MED ORDER — ACETAMINOPHEN 325 MG PO TABS: 650.0000 mg | ORAL_TABLET | Freq: Four times a day (QID) | ORAL | Status: DC | PRN

## 2017-01-14 MED ORDER — HYDRALAZINE HCL 20 MG/ML IJ SOLN: 5.0000 mg | INTRAMUSCULAR | Status: DC | PRN

## 2017-01-14 MED ORDER — HYDROCODONE-ACETAMINOPHEN 7.5-325 MG PO TABS: 1.0000 | ORAL_TABLET | ORAL | Status: DC | PRN

## 2017-01-14 MED ORDER — METHYLPREDNISOLONE SODIUM SUCC 125 MG IJ SOLR
60.0000 mg | Freq: Four times a day (QID) | INTRAMUSCULAR | Status: DC
  Administered 2017-01-14 – 2017-01-16 (×8): 60 mg via INTRAVENOUS
  Filled 2017-01-14 (×8): qty 2

## 2017-01-14 MED ORDER — ROFLUMILAST 500 MCG PO TABS: 500.0000 ug | ORAL_TABLET | Freq: Every day | ORAL | Status: DC

## 2017-01-14 MED ORDER — CARVEDILOL 3.125 MG PO TABS
3.1250 mg | ORAL_TABLET | Freq: Two times a day (BID) | ORAL | Status: DC
  Administered 2017-01-14 – 2017-01-18 (×8): 3.125 mg via ORAL
  Filled 2017-01-14 (×9): qty 1

## 2017-01-14 MED ORDER — ONDANSETRON HCL 4 MG PO TABS: 4.0000 mg | ORAL_TABLET | Freq: Four times a day (QID) | ORAL | Status: DC | PRN

## 2017-01-14 MED ORDER — HEPARIN SODIUM (PORCINE) 5000 UNIT/ML IJ SOLN
5000.0000 [IU] | Freq: Three times a day (TID) | INTRAMUSCULAR | Status: DC
  Administered 2017-01-14 – 2017-01-16 (×6): 5000 [IU] via SUBCUTANEOUS
  Filled 2017-01-14 (×9): qty 1

## 2017-01-14 MED ORDER — ACETAMINOPHEN 650 MG RE SUPP: 650.0000 mg | Freq: Four times a day (QID) | RECTAL | Status: DC | PRN

## 2017-01-14 MED ORDER — ASPIRIN EC 81 MG PO TBEC
81.0000 mg | DELAYED_RELEASE_TABLET | Freq: Every day | ORAL | Status: DC
  Administered 2017-01-14 – 2017-01-18 (×5): 81 mg via ORAL
  Filled 2017-01-14 (×5): qty 1

## 2017-01-14 NOTE — Progress Notes (Signed)
LAIRA PENNINGER [id number] Admission Data: 01/14/2017 8:19 PM Attending Provider: Waldemar Dickens, MD  OLM:[id number], Doretha Sou, MD Consults/ Treatment Team:   Kim [name] is a 61 y.o. female patient admitted from ED awake, alert  & orientated  X 3,  Full Code, VSS - Blood pressure 115/60, pulse 89, temperature 98 F (36.7 C), temperature source Oral, resp. rate 18, height 5\' 3"  (1.6 m), weight 58.1 kg (128 lb), SpO2 96 %., O2    2 L nasal cannular PRN, no c/o shortness of breath, no c/o chest pain, no distress noted. Tele # 35 placed and pt is currently running:normal sinus rhythm.   IV site WDL:  antecubital left, condition patent and no redness with a transparent dsg that's clean dry and intact.  Allergies:   Allergies  Allergen Reactions  . Penicillins Hives    Has patient had a PCN reaction causing immediate rash, facial/tongue/throat swelling, SOB or lightheadedness with hypotension: Yes Has patient had a PCN reaction causing severe rash involving mucus membranes or skin necrosis: No Has patient had a PCN reaction that required hospitalization No Has patient had a PCN reaction occurring within the last 10 years: No If all of the above answers are "NO", then may proceed with Cephalosporin use.      Past Medical History:  Diagnosis Date  . C8 RADICULOPATHY 06/05/2009  . COPD 08/31/2007  . DEPRESSION 11/19/2006  . DYSLIPIDEMIA 03/12/2009  . MI (mitral incompetence)   . 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 OSTEOARTHRITIS 06/05/2009  . PARESTHESIA 08/24/2007  . TOBACCO ABUSE 01/25/2010    History:  obtained from chart review. Tobacco/alcohol: Former smoker none  Pt orientation to unit, room and routine. Information packet given to patient/family and safety video watched.  Admission INP armband ID verified with patient/family, and in place. SR up x 2, fall risk assessment complete with Patient and family verbalizing understanding of risks associated with falls. Pt verbalizes an  understanding of how to use the call bell and to call for help before getting out of bed.  Skin, clean-dry- intact without evidence of bruising, or skin tears.   No evidence of skin break down noted on exam. no rashes, no ecchymoses, no petechiae, no nodules, no jaundice, no purpura, no wounds.    Will cont to monitor and assist as needed.  Salley Slaughter, RN 01/14/2017 8:19 PM

## 2017-01-14 NOTE — H&P (Signed)
History and Physical    Kim [name] ZSW:[id number] DOB: [birth year] DOA: 01/14/2017  PCP: Marletta Lor, MD Patient coming from: Home  Chief Complaint: SOB  HPI: Kim [name] is a 61 y.o. female with medical history significant of patient reports approximately 1 week history of increasing shortness of breath, productive cough, and increasing home O2 usage. Patient was seen by her primary care physician proximally 5 days ago who started her on 40 mg of prednisone daily. She's been compliant with this without any improvement. Patient then saw her pulmonologist on the day prior to admission who increased her steroids to 60 mg. Patient took 1 dose night prior to admission without improvement. Patient is used increasing home breathing treatments without improvement. States that "I feel like he cannot breathe." EMS was called to the home to evaluate patient. On arrival patient's O2 sats saturations were approximate 80%. Given Solu-Medrol and 2 g of magnesium as well as 10 mg of albuterol and with improvement. Denies fever. Denies chest pain, palpitations, abdominal pain, dysuria comfort, flank pain, neck stiffness, focal neurological deficit.  Patient and husband both concerned about elevation in blood pressure readings. Typically worse in the mornings. Patient's husband states that she does snore and is occasionally apneic at night.  ED Course: Objective finding that one below. Weaned to room air after additional nebulizer treatments.  Review of Systems: As per HPI otherwise all other systems reviewed and are negative  Ambulatory Status: Limited secondary to respiratory status.  Past Medical History:  Diagnosis Date  . C8 RADICULOPATHY 06/05/2009  . COPD 08/31/2007  . DEPRESSION 11/19/2006  . DYSLIPIDEMIA 03/12/2009  . MI (mitral incompetence)   . 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 OSTEOARTHRITIS 06/05/2009  . PARESTHESIA 08/24/2007  . TOBACCO ABUSE 01/25/2010    Past Surgical History:    Procedure Laterality Date  . APPENDECTOMY  1969  . CARDIAC CATHETERIZATION N/A 07/05/2015   Procedure: Left Heart Cath and Coronary Angiography;  Surgeon: Leonie Man, MD;  Location: [facility];  Service: Cardiovascular;  Laterality: N/A;  . CHOLECYSTECTOMY  1989  . collapse lung  1990  . EXPLORATORY LAPAROTOMY  1969    Social History   Social History  . Marital status: Married    Spouse name: N/A  . Number of children: 2  . Years of education: N/A   Occupational History  . Patient care aide-sits with elderly lady    Social History Main Topics  . Smoking status: Former Smoker    Packs/day: 2.00    Years: 38.00    Types: Cigarettes    Quit date: 03/07/2014  . Smokeless tobacco: Never Used  . Alcohol use No  . Drug use: No  . Sexual activity: Not on file   Other Topics Concern  . Not on file   Social History Narrative   [facility] (01/12/17):   Originally from [facility]. Has lived in [place] for 17 years. Married. Has 2 dogs currently. No mold and only 1 indoor plant. Had her home professionally cleaned a month ago. No bird or hot tub exposure. 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     Allergies  Allergen Reactions  . Penicillins Hives    Has patient had a PCN reaction causing immediate rash, facial/tongue/throat swelling, SOB or lightheadedness with hypotension: Yes Has patient had a PCN reaction causing severe rash involving mucus membranes or skin necrosis: No Has patient had a PCN reaction that required hospitalization No Has  patient had a PCN reaction occurring within the last 10 years: No If all of the above answers are "NO", then may proceed with Cephalosporin use.     Family History  Problem Relation Age of Onset  . Cancer Mother        lung  . Heart attack Mother   . Hypertension Sister   . Multiple sclerosis Brother   . Diabetes Sister   . Rheum arthritis Sister   . Thyroid disease Sister   . Multiple sclerosis  Other   . Alcohol abuse Other   . Arthritis Other   . Diabetes Other   . Kidney disease Other   . Cancer Other        lung,ovarian,skin, uterine  . Stroke Other   . Heart disease Other   . Melanoma Other   . Heart attack Maternal Uncle   . Heart attack Other        NEICE  . Heart attack Other        NEPHEW  . Hypertension Brother   . Stroke Maternal Grandmother   . Colon cancer Neg Hx       Prior to Admission medications   Medication Sig Start Date End Date Taking? Authorizing Provider  albuterol (PROVENTIL) (2.5 MG/3ML) 0.083% nebulizer solution Take 2.5 mg by nebulization every 6 (six) hours as needed for wheezing or shortness of breath.   Yes [provider]  ALPRAZolam Duanne Moron) 0.5 MG tablet TAKE 1/2 TO 1 TABLET BY MOUTH 3 TIMES A DAY 11/21/16  Yes Marletta Lor, MD  aspirin EC 81 MG EC tablet Take 1 tablet (81 mg total) by mouth daily. 07/08/15  Yes Nita Sells, MD  benzonatate (TESSALON) 100 MG capsule Take 1 capsule (100 mg total) by mouth 3 (three) times daily as needed for cough. 01/12/17  Yes Javier Glazier, MD  carvedilol (COREG) 3.125 MG tablet TAKE 1 TABLET BY MOUTH 2 TIMES DAILY WITH MEALS 08/13/16  Yes [name], MD  Cholecalciferol (VITAMIN D3) 1000 units CAPS Take 1 capsule (1,000 Units total) by mouth 2 (two) times daily. 08/13/15  Yes Isaiah Serge, NP  furosemide (LASIX) 40 MG tablet Take 40 mg by mouth daily as needed (for weight gain of 3 lbs).  08/31/15  Yes [provider]  guaiFENesin (MUCINEX) 600 MG 12 hr tablet Take 2 tablets (1,200 mg total) by mouth 2 (two) times daily as needed for to loosen phlegm. 12/05/16  Yes Verlee Monte, MD  HYDROcodone-acetaminophen (NORCO) 7.5-325 MG tablet TK 1 T PO Q 4 H PRN FOR MODERATE PAIN 01/06/17  Yes Marletta Lor, MD  ipratropium-albuterol (DUONEB) 0.5-2.5 (3) MG/3ML SOLN Take 3 mLs by nebulization every 4 (four) hours as needed (cough). 01/12/17  Yes Javier Glazier, MD   lisinopril (PRINIVIL,ZESTRIL) 2.5 MG tablet Take 2.5 mg by mouth 2 (two) times daily. 10/03/16  Yes [provider]  omeprazole (PRILOSEC) 20 MG capsule Take 20 mg by mouth daily. 10/17/16  Yes [provider]  PAXIL CR 25 MG 24 hr tablet TAKE 1 TABLET BY MOUTH  EVERY MORNING 10/22/16  Yes Marletta Lor, MD  pravastatin (PRAVACHOL) 20 MG tablet Take 1 tablet (20 mg total) by mouth every evening. 12/18/16  Yes [name], MD  predniSONE (DELTASONE) 10 MG tablet 6 tabs X3 days, 5 tabs X3 days, 4 tabs X3 days, 3 tabs X3 days, 2 tabs X3 days, 1 tab X3 days. 01/13/17  Yes Javier Glazier, MD  SYMBICORT 160-4.5 MCG/ACT inhaler inhale 2 puffs by mouth twice a day 02/29/16  Yes Byrum, Rose Fillers, MD  Tiotropium Bromide Monohydrate (SPIRIVA RESPIMAT) 2.5 MCG/ACT AERS Inhale 2 puffs into the lungs daily. Patient taking differently: Inhale 1 puff into the lungs daily.  09/10/16  Yes Tanda Rockers, MD  VENTOLIN HFA 108 (90 Base) MCG/ACT inhaler INHALE 2 PUFFS INTO THE LUNGS EVERY 4 HOURS AS NEEDED FOR WHEEZING OR SHORTNESS OF BREATH 02/06/16  Yes Byrum, Rose Fillers, MD  vitamin B-12 (CYANOCOBALAMIN) 500 MCG tablet Take 500 mcg by mouth 2 (two) times daily.   Yes [provider]  nitroGLYCERIN (NITROSTAT) 0.4 MG SL tablet Place 1 tablet (0.4 mg total) under the tongue every 5 (five) minutes as needed for chest pain. 01/02/17   Eileen Stanford, PA-C  predniSONE (DELTASONE) 20 MG tablet Take 1 tablet (20 mg total) by mouth 2 (two) times daily with a meal. Patient not taking: Reported on 01/14/2017 01/08/17   Marletta Lor, MD  predniSONE (STERAPRED UNI-PAK 21 TAB) 10 MG (21) TBPK tablet Take by mouth as directed. Patient not taking: Reported on 01/14/2017 01/12/17   Javier Glazier, MD  roflumilast (DALIRESP) 500 MCG TABS tablet Take 1 tablet (500 mcg total) by mouth daily. Patient not taking: Reported on 01/14/2017 01/13/17   Javier Glazier, MD  Spacer/Aero  Chamber Mouthpiece MISC 1 Device by Does not apply route as directed. 01/12/17   Javier Glazier, MD    Physical Exam: Vitals:   01/14/17 1145 01/14/17 1200 01/14/17 1215 01/14/17 1230  BP: 119/63 117/68 (!) 111/55 (!) 114/58  Pulse: 80 82 79 85  Resp: (!) 21 18 (!) 21 (!) 21  Temp:      TempSrc:      SpO2: 99% 100% 91% 96%  Weight:      Height:         General: Sitting up in bed. Somewhat anxious. Eyes:  PERRL, EOMI, normal lids, iris ENT:  grossly normal hearing, lips & tongue, mmm Neck:  no LAD, masses or thyromegaly Cardiovascular: Difficult to appreciate cardiac sounds due to respiratory symptoms. RRR, no m/r/g. No LE edema.  Respiratory: Diffuse wheezing throughout with occasional rhonchi. Wet sounding cough with central clearing after coughing. Increased effort. Abdomen:  soft, ntnd, NABS Skin:  no rash or induration seen on limited exam Musculoskeletal:  grossly normal tone BUE/BLE, good ROM, no bony abnormality Psychiatric:  grossly normal mood and affect, speech fluent and appropriate, AOx3 Neurologic:  CN 2-12 grossly intact, moves all extremities in coordinated fashion, sensation intact  Labs on Admission: I have personally reviewed following labs and imaging studies  CBC:  Recent Labs Lab 01/14/17 1120  WBC 19.2*  NEUTROABS 10.2*  HGB 14.0  HCT 43.6  MCV 95.8  PLT 017   Basic Metabolic Panel:  Recent Labs Lab 01/14/17 1120  NA 140  K 3.8  CL 102  CO2 27  GLUCOSE 103*  BUN 26*  CREATININE 0.95  CALCIUM 9.0   GFR: Estimated Creatinine Clearance: 50.8 mL/min (by C-G formula based on SCr of 0.95 mg/dL). Liver Function Tests: No results for input(s): AST, ALT, ALKPHOS, BILITOT, PROT, ALBUMIN in the last 168 hours. No results for input(s): LIPASE, AMYLASE in the last 168 hours. No results for input(s): AMMONIA in the last 168 hours. Coagulation Profile: No results for input(s): INR, PROTIME in the last 168 hours. Cardiac Enzymes: No results  for input(s): CKTOTAL, CKMB, CKMBINDEX, TROPONINI in the last 168  hours. BNP (last 3 results)  Recent Labs  08/26/16 1058  PROBNP 43   HbA1C: No results for input(s): HGBA1C in the last 72 hours. CBG: No results for input(s): GLUCAP in the last 168 hours. Lipid Profile: No results for input(s): CHOL, HDL, LDLCALC, TRIG, CHOLHDL, LDLDIRECT in the last 72 hours. Thyroid Function Tests: No results for input(s): TSH, T4TOTAL, FREET4, T3FREE, THYROIDAB in the last 72 hours. Anemia Panel: No results for input(s): VITAMINB12, FOLATE, FERRITIN, TIBC, IRON, RETICCTPCT in the last 72 hours. Urine analysis:    Component Value Date/Time   COLORURINE yellow 02/26/2010 0951   APPEARANCEUR Clear 02/26/2010 0951   LABSPEC 1.025 02/26/2010 0951   PHURINE 5.0 02/26/2010 0951   HGBUR trace-lysed 02/26/2010 0951   BILIRUBINUR neg 03/07/2016 1041   PROTEINUR neg 03/07/2016 1041   UROBILINOGEN 0.2 03/07/2016 1041   UROBILINOGEN 0.2 02/26/2010 0951   NITRITE neg 03/07/2016 1041   NITRITE negative 02/26/2010 0951   LEUKOCYTESUR Negative 03/07/2016 1041    Creatinine Clearance: Estimated Creatinine Clearance: 50.8 mL/min (by C-G formula based on SCr of 0.95 mg/dL).  Sepsis Labs: @LABRCNTIP (procalcitonin:4,lacticidven:4) )No results found for this or any previous visit (from the past 240 hour(s)).   Radiological Exams on Admission: Dg Chest Port 1 View  Result Date: 01/14/2017 CLINICAL DATA:  Shortness of breath. EXAM: PORTABLE CHEST 1 VIEW COMPARISON:  Radiographs of December 04, 2016. FINDINGS: The heart size and mediastinal contours are within normal limits. Both lungs are clear. No pneumothorax or pleural effusion is noted. The visualized skeletal structures are unremarkable. IMPRESSION: No acute cardiopulmonary abnormality seen. Electronically Signed   By: Marijo Conception, M.D.   On: 01/14/2017 11:44    EKG: Independently reviewed.  Sinus. No ACS  Assessment/Plan Active Problems:   COPD  exacerbation (HCC)   Chronic respiratory failure (HCC)   HLD (hyperlipidemia)   Sleep apnea   Dependent edema   Acute on chronic respiratory failure: Secondary to progressive COPD. Recent admission for the same. Failed outpatient interventions by PCP and pulmonology. - Solu-Medrol 60 mg every 6 hours, DuoNeb nebs every 4 hours scheduled, Mucinex, Levaquin - Wean O2 as able - Res viral panel, Procalcitonin - continue symbicort, spiriva, Daliresp  Snoring/apnea: Reported by patient's spouse. Concern also brace for patient's gradual increase in blood pressure especially in the mornings. - Outpatient evaluation for sleep study for OSA and CPAP  HTN: - continue lisinopril, coreg - Hydralazine IV prn  Minimal CAD: Cath 2017 w/ minimal disease - continue ASA, statin  Chronic pain/anxiety: - cotninue norco, Xanax  Dependent edema:  - continue lasix PRN (recomend PCP reconsider use of diuretic for this and DC)   DVT prophylaxis: hep  Code Status: full  Family Communication: husband  Disposition Plan: pending improvement  Consults called: none  Admission status: observation    Waldemar Dickens MD [facility]  If 7PM-7AM, please contact night-coverage [email or web address] Password [email or web address]  01/14/2017, 2:00 PM

## 2017-01-14 NOTE — ED Triage Notes (Signed)
Per ems- for past 5 days, has been sob with hx of COPD. Went to PCP, wanted her to be admitted, but refused, started on prednisone. Is no better, wheezing, sob. Given 12.5 mg albuterol, 0.5 atrovent, 125 solumedrol, 2 g mag sulfate. Is some better, but is still sob, wheezing. EKG SR. Initial sats mid 80's, om nebulizer 96%. Denies pain.

## 2017-01-14 NOTE — ED Notes (Signed)
Pt removed [place], O2 currently 95% on RA. Will continue to monitor.

## 2017-01-14 NOTE — ED Notes (Signed)
Breathing improved after neb tx. Is resting more comfortably, 94% RA.

## 2017-01-14 NOTE — ED Provider Notes (Signed)
[facility] DEPT Provider Note   CSN: [id number] Arrival date & time: 01/14/17  1103     History   Chief Complaint Chief Complaint  Patient presents with  . Shortness of Breath    HPI Kim [name] is a 61 y.o. female.  The history is provided by the patient. No language interpreter was used.  Shortness of Breath    Kim [name] is a 61 y.o. female who presents to the Emergency Department complaining of sob.  She has a history of COPD and presents for evaluation of worsening shortness of breath. Her symptoms started last Thursday with increased shortness of breath, cough productive of white and clear sputum. She saw her PCP and was started on 40 mg of prednisone daily. 2 days ago she saw her pulmonologist and was started on a steroid taper that she started taking yesterday with 60 mg of prednisone. She reports worsening of her shortness of breath as well as feelings of anxiety. She was unable to take her medications today. On EMS arrival her sats were in the 59s. She was given 125 mg of Solu-Medrol, 2 g of magnesium sulfate, 12.5 mg albuterol, 0.5 mg Atrovent. She reports persistent shortness of breath in the emergency department. The symptoms are the worse she has experienced but similar to her prior COPD exacerbations. Past Medical History:  Diagnosis Date  . C8 RADICULOPATHY 06/05/2009  . COPD 08/31/2007  . DEPRESSION 11/19/2006  . DYSLIPIDEMIA 03/12/2009  . MI (mitral incompetence)   . 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 OSTEOARTHRITIS 06/05/2009  . PARESTHESIA 08/24/2007  . TOBACCO ABUSE 01/25/2010    Patient Active Problem List   Diagnosis Date Noted  . Hypotension   . Upper airway cough syndrome 09/11/2016  . Allergic rhinitis 08/10/2015  . Chronic respiratory failure ([place]) 07/05/2015  . Hypoxemia   . Hyperglycemia   . HLD (hyperlipidemia)   . Coronary artery calcification seen on CAT scan 07/02/2015  . COPD exacerbation ([place]) 06/24/2015  . Solitary pulmonary nodule  12/02/2013  . Loss of weight 11/10/2013  . Osteoarthritis 06/05/2009  . Brachial neuritis or radiculitis 06/05/2009  . Dyslipidemia 03/12/2009  . 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 COPD GOLD III  08/31/2007  . PARESTHESIA 08/24/2007  . DEPRESSION 11/19/2006    Past Surgical History:  Procedure Laterality Date  . APPENDECTOMY  1969  . CARDIAC CATHETERIZATION N/A 07/05/2015   Procedure: Left Heart Cath and Coronary Angiography;  Surgeon: Leonie Man, MD;  Location: [facility];  Service: Cardiovascular;  Laterality: N/A;  . CHOLECYSTECTOMY  1989  . collapse lung  1990  . EXPLORATORY LAPAROTOMY  1969    OB History    No data available       Home Medications    Prior to Admission medications   Medication Sig Start Date End Date Taking? Authorizing Provider  albuterol (PROVENTIL) (2.5 MG/3ML) 0.083% nebulizer solution Take 2.5 mg by nebulization every 6 (six) hours as needed for wheezing or shortness of breath.    [provider]  ALPRAZolam Duanne Moron) 0.5 MG tablet TAKE 1/2 TO 1 TABLET BY MOUTH 3 TIMES A DAY 11/21/16   Marletta Lor, MD  aspirin EC 81 MG EC tablet Take 1 tablet (81 mg total) by mouth daily. 07/08/15   Nita Sells, MD  benzonatate (TESSALON) 100 MG capsule Take 1 capsule (100 mg total) by mouth 3 (three) times daily as needed for cough. 01/12/17   Javier Glazier, MD  carvedilol (COREG) 3.125 MG tablet TAKE  1 TABLET BY MOUTH 2 TIMES DAILY WITH MEALS 08/13/16   [name], MD  Cholecalciferol (VITAMIN D3) 1000 units CAPS Take 1 capsule (1,000 Units total) by mouth 2 (two) times daily. 08/13/15   Isaiah Serge, NP  furosemide (LASIX) 40 MG tablet Take 40 mg by mouth daily as needed (for weight gain of 3 lbs).  08/31/15   [provider]  guaiFENesin (MUCINEX) 600 MG 12 hr tablet Take 2 tablets (1,200 mg total) by mouth 2 (two) times daily as needed for to loosen phlegm. 12/05/16   Verlee Monte, MD  HYDROcodone-acetaminophen  (NORCO) 7.5-325 MG tablet TK 1 T PO Q 4 H PRN FOR MODERATE PAIN 01/06/17   Marletta Lor, MD  ipratropium-albuterol (DUONEB) 0.5-2.5 (3) MG/3ML SOLN Take 3 mLs by nebulization every 4 (four) hours as needed (cough). 01/12/17   Javier Glazier, MD  lisinopril (PRINIVIL,ZESTRIL) 2.5 MG tablet Take 2.5 mg by mouth 2 (two) times daily. 10/03/16   [provider]  nitroGLYCERIN (NITROSTAT) 0.4 MG SL tablet Place 1 tablet (0.4 mg total) under the tongue every 5 (five) minutes as needed for chest pain. 01/02/17   Eileen Stanford, PA-C  omeprazole (PRILOSEC) 20 MG capsule Take 20 mg by mouth daily. 10/17/16   [provider]  PAXIL CR 25 MG 24 hr tablet TAKE 1 TABLET BY MOUTH  EVERY MORNING 10/22/16   Marletta Lor, MD  pravastatin (PRAVACHOL) 20 MG tablet Take 1 tablet (20 mg total) by mouth every evening. 12/18/16   [name], MD  predniSONE (DELTASONE) 10 MG tablet 6 tabs X3 days, 5 tabs X3 days, 4 tabs X3 days, 3 tabs X3 days, 2 tabs X3 days, 1 tab X3 days. 01/13/17   Javier Glazier, MD  predniSONE (DELTASONE) 20 MG tablet Take 1 tablet (20 mg total) by mouth 2 (two) times daily with a meal. 01/08/17   Marletta Lor, MD  predniSONE (STERAPRED UNI-PAK 21 TAB) 10 MG (21) TBPK tablet Take by mouth as directed. 01/12/17   Javier Glazier, MD  roflumilast (DALIRESP) 500 MCG TABS tablet Take 1 tablet (500 mcg total) by mouth daily. 01/13/17   Javier Glazier, MD  Spacer/Aero Chamber Mouthpiece MISC 1 Device by Does not apply route as directed. 01/12/17   Javier Glazier, MD  SYMBICORT 160-4.5 MCG/ACT inhaler inhale 2 puffs by mouth twice a day 02/29/16   Collene Gobble, MD  Tiotropium Bromide Monohydrate (SPIRIVA RESPIMAT) 2.5 MCG/ACT AERS Inhale 2 puffs into the lungs daily. Patient taking differently: Inhale 1 puff into the lungs daily.  09/10/16   Tanda Rockers, MD  VENTOLIN HFA 108 (90 Base) MCG/ACT inhaler INHALE 2 PUFFS INTO THE LUNGS EVERY 4  HOURS AS NEEDED FOR WHEEZING OR SHORTNESS OF BREATH 02/06/16   Collene Gobble, MD  vitamin B-12 (CYANOCOBALAMIN) 500 MCG tablet Take 500 mcg by mouth 2 (two) times daily.    [provider]    Family History Family History  Problem Relation Age of Onset  . Cancer Mother        lung  . Heart attack Mother   . Hypertension Sister   . Multiple sclerosis Brother   . Diabetes Sister   . Rheum arthritis Sister   . Thyroid disease Sister   . Multiple sclerosis Other   . Alcohol abuse Other   . Arthritis Other   . Diabetes Other   . Kidney disease Other   . Cancer  Other        lung,ovarian,skin, uterine  . Stroke Other   . Heart disease Other   . Melanoma Other   . Heart attack Maternal Uncle   . Heart attack Other        NEICE  . Heart attack Other        NEPHEW  . Hypertension Brother   . Stroke Maternal Grandmother   . Colon cancer Neg Hx     Social History Social History  Substance Use Topics  . Smoking status: Former Smoker    Packs/day: 2.00    Years: 38.00    Types: Cigarettes    Quit date: 03/07/2014  . Smokeless tobacco: Never Used  . Alcohol use No     Allergies   Penicillins   Review of Systems Review of Systems  Respiratory: Positive for shortness of breath.   All other systems reviewed and are negative.    Physical Exam Updated Vital Signs BP 139/73 (BP Location: Right Arm)   Pulse 92   Temp 98.3 F (36.8 C) (Oral)   Resp (!) 26   Ht 5\' 3"  (1.6 m)   Wt 58.1 kg (128 lb)   SpO2 94%   BMI 22.67 kg/m   Physical Exam  Constitutional: She is oriented to person, place, and time. She appears well-developed and well-nourished.  HENT:  Head: Normocephalic and atraumatic.  Cardiovascular: Normal rate and regular rhythm.   No murmur heard. Pulmonary/Chest:  Tachypnea with diffuse wheezes and rhonchi bilaterally. Accessory muscle use. Speaks in short phrases.  Abdominal: Soft. There is no tenderness. There is no rebound and no guarding.    Musculoskeletal: She exhibits no edema or tenderness.  Neurological: She is alert and oriented to person, place, and time.  Skin: Skin is warm and dry.  Psychiatric: She has a normal mood and affect. Her behavior is normal.  Nursing note and vitals reviewed.    ED Treatments / Results  Labs (all labs ordered are listed, but only abnormal results are displayed) Labs Reviewed  BASIC METABOLIC PANEL  CBC WITH DIFFERENTIAL/PLATELET  I-STAT TROPONIN, ED  I-STAT VENOUS BLOOD GAS, ED    EKG  EKG Interpretation None       Radiology No results found.  Procedures Procedures (including critical care time)  Medications Ordered in ED Medications  albuterol (PROVENTIL) (2.5 MG/3ML) 0.083% nebulizer solution 5 mg (not administered)     Initial Impression / Assessment and Plan / ED Course  I have reviewed the triage vital signs and the nursing notes.  Pertinent labs & imaging results that were available during my care of the patient were reviewed by me and considered in my medical decision making (see chart for details).     Patient with COPD with increased shortness of breath despite being on prednisone home. Patient with respiratory distress on ED arrival with increased work of breathing and wheezes and rhonchi. Following an additional treatment in the emergency department on repeat assessment she is significantly improved. She does have persistent wheezes and rhonchi but respiratory distresses has resolved. No evidence of acute pneumonia, CHF. Presentation is not consistent with PE. Hospitalist consulted for admission for COPD exacerbation.  Final Clinical Impressions(s) / ED Diagnoses   Final diagnoses:  None    New Prescriptions New Prescriptions   No medications on file     Quintella Reichert, MD 01/14/17 1759

## 2017-01-15 DIAGNOSIS — G4730 Sleep apnea, unspecified: Secondary | ICD-10-CM | POA: Diagnosis present

## 2017-01-15 DIAGNOSIS — Z87891 Personal history of nicotine dependence: Secondary | ICD-10-CM | POA: Diagnosis not present

## 2017-01-15 DIAGNOSIS — G8929 Other chronic pain: Secondary | ICD-10-CM | POA: Diagnosis present

## 2017-01-15 DIAGNOSIS — Z79899 Other long term (current) drug therapy: Secondary | ICD-10-CM | POA: Diagnosis not present

## 2017-01-15 DIAGNOSIS — Z880 Allergy status to penicillin: Secondary | ICD-10-CM | POA: Diagnosis not present

## 2017-01-15 DIAGNOSIS — J9611 Chronic respiratory failure with hypoxia: Secondary | ICD-10-CM | POA: Diagnosis not present

## 2017-01-15 DIAGNOSIS — D72829 Elevated white blood cell count, unspecified: Secondary | ICD-10-CM | POA: Diagnosis not present

## 2017-01-15 DIAGNOSIS — T380X5A Adverse effect of glucocorticoids and synthetic analogues, initial encounter: Secondary | ICD-10-CM | POA: Diagnosis not present

## 2017-01-15 DIAGNOSIS — I10 Essential (primary) hypertension: Secondary | ICD-10-CM | POA: Diagnosis present

## 2017-01-15 DIAGNOSIS — E785 Hyperlipidemia, unspecified: Secondary | ICD-10-CM | POA: Diagnosis present

## 2017-01-15 DIAGNOSIS — J441 Chronic obstructive pulmonary disease with (acute) exacerbation: Secondary | ICD-10-CM | POA: Diagnosis present

## 2017-01-15 DIAGNOSIS — F419 Anxiety disorder, unspecified: Secondary | ICD-10-CM | POA: Diagnosis present

## 2017-01-15 DIAGNOSIS — J9620 Acute and chronic respiratory failure, unspecified whether with hypoxia or hypercapnia: Secondary | ICD-10-CM | POA: Diagnosis present

## 2017-01-15 DIAGNOSIS — F329 Major depressive disorder, single episode, unspecified: Secondary | ICD-10-CM | POA: Diagnosis present

## 2017-01-15 DIAGNOSIS — I2510 Atherosclerotic heart disease of native coronary artery without angina pectoris: Secondary | ICD-10-CM | POA: Diagnosis present

## 2017-01-15 LAB — CBC
HEMATOCRIT: 43.6 % (ref 36.0–46.0)
HEMOGLOBIN: 13.9 g/dL (ref 12.0–15.0)
MCH: 30.6 pg (ref 26.0–34.0)
MCHC: 31.9 g/dL (ref 30.0–36.0)
MCV: 96 fL (ref 78.0–100.0)
Platelets: 316 10*3/uL (ref 150–400)
RBC: 4.54 MIL/uL (ref 3.87–5.11)
RDW: 14.2 % (ref 11.5–15.5)
WBC: 20 10*3/uL — AB (ref 4.0–10.5)

## 2017-01-15 LAB — BASIC METABOLIC PANEL
ANION GAP: 9 (ref 5–15)
BUN: 22 mg/dL — ABNORMAL HIGH (ref 6–20)
CHLORIDE: 102 mmol/L (ref 101–111)
CO2: 28 mmol/L (ref 22–32)
Calcium: 9.4 mg/dL (ref 8.9–10.3)
Creatinine, Ser: 0.93 mg/dL (ref 0.44–1.00)
GFR calc non Af Amer: >60 mL/min (ref >60)
Glucose, Bld: 171 mg/dL — ABNORMAL HIGH (ref 65–99)
Potassium: 4.4 mmol/L (ref 3.5–5.1)
Sodium: 139 mmol/L (ref 135–145)

## 2017-01-15 LAB — PATHOLOGIST SMEAR REVIEW

## 2017-01-15 LAB — HIV ANTIBODY (ROUTINE TESTING W REFLEX): HIV SCREEN 4TH GENERATION: NONREACTIVE

## 2017-01-15 LAB — ALPHA-1 ANTITRYPSIN PHENOTYPE: A1 ANTITRYPSIN SER: 102 mg/dL (ref 83–199)

## 2017-01-15 MED ORDER — ROFLUMILAST 500 MCG PO TABS
500.0000 ug | ORAL_TABLET | Freq: Every day | ORAL | Status: DC
  Administered 2017-01-17: 500 ug via ORAL
  Filled 2017-01-15 (×4): qty 1

## 2017-01-15 MED ORDER — IPRATROPIUM-ALBUTEROL 0.5-2.5 (3) MG/3ML IN SOLN
3.0000 mL | Freq: Three times a day (TID) | RESPIRATORY_TRACT | Status: DC
  Administered 2017-01-16 – 2017-01-18 (×8): 3 mL via RESPIRATORY_TRACT
  Filled 2017-01-15 (×8): qty 3

## 2017-01-15 MED ORDER — AZITHROMYCIN 500 MG PO TABS
500.0000 mg | ORAL_TABLET | Freq: Every day | ORAL | Status: DC
  Administered 2017-01-15 – 2017-01-18 (×4): 500 mg via ORAL
  Filled 2017-01-15 (×4): qty 1

## 2017-01-15 MED ORDER — AZITHROMYCIN 500 MG PO TABS: 250.0000 mg | ORAL_TABLET | Freq: Every day | ORAL | Status: DC

## 2017-01-15 NOTE — Progress Notes (Signed)
PROGRESS NOTE    Kim [name]  MVH:[id number] DOB: 19-Mar-1956 DOA: 01/14/2017 PCP: Marletta Lor, MD     Brief Narrative:  Kim [name] is a 61 y.o. female with medical history significant of patient reports approximately 1 week history of increasing shortness of breath, productive cough, and increasing home O2 usage. Patient was seen by her primary care physician approximally 5 days ago who started her on 40 mg of prednisone daily. She's been compliant with this however without any improvement. Patient then saw her pulmonologist on the day prior to admission who increased her steroids to 60 mg. Patient took 1 dose night prior to admission without improvement. Patient is used increasing home breathing treatments without improvement. EMS was called to the home to evaluate patient. On arrival, patient's O2 sats were ~80%. She was given Solu-Medrol and 2 g of magnesium as well as 10 mg of albuterol and with improvement. She has been admitted to the hospital due to COPD exacerbation.  Assessment & Plan:   Active Problems:   COPD exacerbation (HCC)   Chronic respiratory failure (HCC)   HLD (hyperlipidemia)   Sleep apnea   Dependent edema  Acute on chronic respiratory failure secondary to COPD exacerbation -Respiratory viral panel +rhinovirus -CXR: No acute cardiopulmonary abnormality seen. -Continue Dulera, Spiriva, Daliresp -Continue Solu-Medrol, DuoNeb, azithromycin -Wean nasal cannula O2 as able  Leukocytosis -Secondary to steroid use, trend  Essential hypertension -Continue lisinopril, Coreg  CAD -Continue aspirin and statin  Chronic pain and anxiety -Continue home Norco, Xanax   DVT prophylaxis: subq hep Code Status: Full Family Communication: husband at bedside Disposition Plan: pending improvement, back home   Consultants:   none  Procedures:   none  Antimicrobials:  Anti-infectives    Start     Dose/Rate Route Frequency Ordered Stop   01/15/17  1400  azithromycin (ZITHROMAX) tablet 500 mg     500 mg Oral Daily 01/15/17 0734     01/15/17 1000  azithromycin (ZITHROMAX) tablet 250 mg  Status:  Discontinued     250 mg Oral Daily 01/15/17 0734 01/15/17 0734   01/14/17 1500  levofloxacin (LEVAQUIN) IVPB 500 mg  Status:  Discontinued     500 mg 100 mL/hr over 60 Minutes Intravenous Every 24 hours 01/14/17 1406 01/15/17 0734       Subjective: Patient continues to be short of breath, cough is now nonproductive. States that overall she has improved, however, still wheezing and has coughing fits.  Objective: Vitals:   01/15/17 0812 01/15/17 0943 01/15/17 1240 01/15/17 1308  BP:  119/60  118/68  Pulse:  93  84  Resp:    18  Temp:    98.1 F (36.7 C)  TempSrc:    Oral  SpO2: 96%  96% 93%  Weight:      Height:        Intake/Output Summary (Last 24 hours) at 01/15/17 1323 Last data filed at 01/14/17 2200  Gross per 24 hour  Intake              220 ml  Output                0 ml  Net              220 ml   Filed Weights   01/14/17 1118  Weight: 58.1 kg (128 lb)    Examination:  General exam: Appears calm and comfortable  Respiratory system: +expiratory wheezes bilaterally, Respiratory effort normal. Cardiovascular system: S1 &  S2 heard, RRR. No JVD, murmurs, rubs, gallops or clicks. No pedal edema. Gastrointestinal system: Abdomen is nondistended, soft and nontender. No organomegaly or masses felt. Normal bowel sounds heard. Central nervous system: Alert and oriented. No focal neurological deficits. Extremities: Symmetric 5 x 5 power. Skin: No rashes, lesions or ulcers Psychiatry: Judgement and insight appear normal. Mood & affect appropriate.   Data Reviewed: I have personally reviewed following labs and imaging studies  CBC:  Recent Labs Lab 01/14/17 1120 01/15/17 0645  WBC 19.2* 20.0*  NEUTROABS 10.2*  --   HGB 14.0 13.9  HCT 43.6 43.6  MCV 95.8 96.0  PLT 324 401   Basic Metabolic Panel:  Recent  Labs Lab 01/14/17 1120 01/15/17 0645  NA 140 139  K 3.8 4.4  CL 102 102  CO2 27 28  GLUCOSE 103* 171*  BUN 26* 22*  CREATININE 0.95 0.93  CALCIUM 9.0 9.4   GFR: Estimated Creatinine Clearance: 51.9 mL/min (by C-G formula based on SCr of 0.93 mg/dL). Liver Function Tests: No results for input(s): AST, ALT, ALKPHOS, BILITOT, PROT, ALBUMIN in the last 168 hours. No results for input(s): LIPASE, AMYLASE in the last 168 hours. No results for input(s): AMMONIA in the last 168 hours. Coagulation Profile: No results for input(s): INR, PROTIME in the last 168 hours. Cardiac Enzymes: No results for input(s): CKTOTAL, CKMB, CKMBINDEX, TROPONINI in the last 168 hours. BNP (last 3 results)  Recent Labs  08/26/16 1058  PROBNP 43   HbA1C: No results for input(s): HGBA1C in the last 72 hours. CBG: No results for input(s): GLUCAP in the last 168 hours. Lipid Profile: No results for input(s): CHOL, HDL, LDLCALC, TRIG, CHOLHDL, LDLDIRECT in the last 72 hours. Thyroid Function Tests: No results for input(s): TSH, T4TOTAL, FREET4, T3FREE, THYROIDAB in the last 72 hours. Anemia Panel: No results for input(s): VITAMINB12, FOLATE, FERRITIN, TIBC, IRON, RETICCTPCT in the last 72 hours. Sepsis Labs:  Recent Labs Lab 01/14/17 [place] <0.10    Recent Results (from the past 240 hour(s))  Respiratory Panel by PCR     Status: Abnormal   Collection Time: 01/14/17  3:21 PM  Result Value Ref Range Status   Adenovirus NOT DETECTED NOT DETECTED Final   Coronavirus 229E NOT DETECTED NOT DETECTED Final   Coronavirus HKU1 NOT DETECTED NOT DETECTED Final   Coronavirus NL63 NOT DETECTED NOT DETECTED Final   Coronavirus OC43 NOT DETECTED NOT DETECTED Final   Metapneumovirus NOT DETECTED NOT DETECTED Final   Rhinovirus / Enterovirus DETECTED (A) NOT DETECTED Final   Influenza A NOT DETECTED NOT DETECTED Final   Influenza B NOT DETECTED NOT DETECTED Final   Parainfluenza Virus 1 NOT  DETECTED NOT DETECTED Final   Parainfluenza Virus 2 NOT DETECTED NOT DETECTED Final   Parainfluenza Virus 3 NOT DETECTED NOT DETECTED Final   Parainfluenza Virus 4 NOT DETECTED NOT DETECTED Final   Respiratory Syncytial Virus NOT DETECTED NOT DETECTED Final   Bordetella pertussis NOT DETECTED NOT DETECTED Final   Chlamydophila pneumoniae NOT DETECTED NOT DETECTED Final   Mycoplasma pneumoniae NOT DETECTED NOT DETECTED Final       Radiology Studies: Dg Chest Port 1 View  Result Date: 01/14/2017 CLINICAL DATA:  Shortness of breath. EXAM: PORTABLE CHEST 1 VIEW COMPARISON:  Radiographs of December 04, 2016. FINDINGS: The heart size and mediastinal contours are within normal limits. Both lungs are clear. No pneumothorax or pleural effusion is noted. The visualized skeletal structures are unremarkable. IMPRESSION: No acute cardiopulmonary  abnormality seen. Electronically Signed   By: Marijo Conception, M.D.   On: 01/14/2017 11:44      Scheduled Meds: . aspirin EC  81 mg Oral Daily  . azithromycin  500 mg Oral Daily  . carvedilol  3.125 mg Oral BID WC  . guaiFENesin  1,200 mg Oral BID  . heparin  5,000 Units Subcutaneous Q8H  . ipratropium-albuterol  3 mL Nebulization Q4H  . lisinopril  2.5 mg Oral BID  . methylPREDNISolone (SOLU-MEDROL) injection  60 mg Intravenous Q6H  . mometasone-formoterol  2 puff Inhalation BID  . pantoprazole  40 mg Oral Daily  . PARoxetine  25 mg Oral q morning - 10a  . pravastatin  20 mg Oral QPM  . roflumilast  500 mcg Oral Daily  . tiotropium  1 capsule Inhalation Daily   Continuous Infusions:   LOS: 0 days    Time spent: 40 minutes   Dessa Phi, DO [facility] [email or web address] Password [email or web address] 01/15/2017, 1:23 PM

## 2017-01-16 LAB — CBC
HEMATOCRIT: 40.2 % (ref 36.0–46.0)
Hemoglobin: 12.9 g/dL (ref 12.0–15.0)
MCH: 30.8 pg (ref 26.0–34.0)
MCHC: 32.1 g/dL (ref 30.0–36.0)
MCV: 95.9 fL (ref 78.0–100.0)
Platelets: 296 10*3/uL (ref 150–400)
RBC: 4.19 MIL/uL (ref 3.87–5.11)
RDW: 14.4 % (ref 11.5–15.5)
WBC: 24.8 10*3/uL — AB (ref 4.0–10.5)

## 2017-01-16 MED ORDER — METHYLPREDNISOLONE SODIUM SUCC 125 MG IJ SOLR
60.0000 mg | Freq: Two times a day (BID) | INTRAMUSCULAR | Status: DC
  Administered 2017-01-16: 60 mg via INTRAVENOUS
  Administered 2017-01-17: 13:00:00 via INTRAVENOUS
  Administered 2017-01-18: 60 mg via INTRAVENOUS
  Filled 2017-01-16 (×3): qty 2

## 2017-01-16 NOTE — Progress Notes (Signed)
PROGRESS NOTE    Kim [name]  GEZ:[id number] DOB: 1955/11/14 DOA: 01/14/2017 PCP: Marletta Lor, MD     Brief Narrative:  Kim [name] is a 61 y.o. female with medical history significant of patient reports approximately 1 week history of increasing shortness of breath, productive cough, and increasing home O2 usage. Patient was seen by her primary care physician approximally 5 days ago who started her on 40 mg of prednisone daily. She's been compliant with this however without any improvement. Patient then saw her pulmonologist on the day prior to admission who increased her steroids to 60 mg. Patient took 1 dose night prior to admission without improvement. Patient is used increasing home breathing treatments without improvement. EMS was called to the home to evaluate patient. On arrival, patient's O2 sats were ~80%. She was given Solu-Medrol and 2 g of magnesium as well as 10 mg of albuterol and with improvement. She has been admitted to the hospital due to COPD exacerbation.  Assessment & Plan:   Active Problems:   COPD exacerbation (HCC)   Chronic respiratory failure (HCC)   HLD (hyperlipidemia)   Sleep apnea   Dependent edema  Acute on chronic respiratory failure secondary to COPD exacerbation -Respiratory viral panel +rhinovirus -CXR: No acute cardiopulmonary abnormality seen. -Continue Dulera, Spiriva, Daliresp -Continue Solu-Medrol, DuoNeb, azithromycin. Wean steroids to q12h.  -Wean nasal cannula O2 as able  Leukocytosis -Secondary to steroid use, trend  Essential hypertension -Continue lisinopril, Coreg  CAD -Continue aspirin and statin  Chronic pain and anxiety -Continue home Norco, Xanax   DVT prophylaxis: subq hep Code Status: Full Family Communication: husband at bedside Disposition Plan: pending improvement, back home   Consultants:   none  Procedures:   none  Antimicrobials:  Anti-infectives    Start     Dose/Rate Route Frequency  Ordered Stop   01/15/17 1400  azithromycin (ZITHROMAX) tablet 500 mg     500 mg Oral Daily 01/15/17 0734     01/15/17 1000  azithromycin (ZITHROMAX) tablet 250 mg  Status:  Discontinued     250 mg Oral Daily 01/15/17 0734 01/15/17 0734   01/14/17 1500  levofloxacin (LEVAQUIN) IVPB 500 mg  Status:  Discontinued     500 mg 100 mL/hr over 60 Minutes Intravenous Every 24 hours 01/14/17 1406 01/15/17 0734       Subjective: Patient continues to have "heavy" breathing, cough, and wheezes. No chest pain.   Objective: Vitals:   01/16/17 0456 01/16/17 0834 01/16/17 0932 01/16/17 1338  BP: (!) 126/50  (!) 120/54 (!) 109/56  Pulse: 74  98 62  Resp: 18   16  Temp: 98.6 F (37 C)   98.3 F (36.8 C)  TempSrc: Oral   Oral  SpO2: 92% 92%  93%  Weight:      Height:        Intake/Output Summary (Last 24 hours) at 01/16/17 1358 Last data filed at 01/16/17 1000  Gross per 24 hour  Intake              390 ml  Output                0 ml  Net              390 ml   Filed Weights   01/14/17 1118  Weight: 58.1 kg (128 lb)    Examination:  General exam: Appears calm and comfortable  Respiratory system: +expiratory wheezes bilaterally L>R, improved slightly from yesterday. Respiratory  effort normal. Cardiovascular system: S1 & S2 heard, RRR. No JVD, murmurs, rubs, gallops or clicks. No pedal edema. Gastrointestinal system: Abdomen is nondistended, soft and nontender. No organomegaly or masses felt. Normal bowel sounds heard. Central nervous system: Alert and oriented. No focal neurological deficits. Extremities: Symmetric 5 x 5 power. Skin: No rashes, lesions or ulcers Psychiatry: Judgement and insight appear normal. Mood & affect appropriate.   Data Reviewed: I have personally reviewed following labs and imaging studies  CBC:  Recent Labs Lab 01/14/17 1120 01/15/17 0645 01/16/17 0500  WBC 19.2* 20.0* 24.8*  NEUTROABS 10.2*  --   --   HGB 14.0 13.9 12.9  HCT 43.6 43.6 40.2  MCV  95.8 96.0 95.9  PLT 324 316 712   Basic Metabolic Panel:  Recent Labs Lab 01/14/17 1120 01/15/17 0645  NA 140 139  K 3.8 4.4  CL 102 102  CO2 27 28  GLUCOSE 103* 171*  BUN 26* 22*  CREATININE 0.95 0.93  CALCIUM 9.0 9.4   GFR: Estimated Creatinine Clearance: 51.9 mL/min (by C-G formula based on SCr of 0.93 mg/dL). Liver Function Tests: No results for input(s): AST, ALT, ALKPHOS, BILITOT, PROT, ALBUMIN in the last 168 hours. No results for input(s): LIPASE, AMYLASE in the last 168 hours. No results for input(s): AMMONIA in the last 168 hours. Coagulation Profile: No results for input(s): INR, PROTIME in the last 168 hours. Cardiac Enzymes: No results for input(s): CKTOTAL, CKMB, CKMBINDEX, TROPONINI in the last 168 hours. BNP (last 3 results)  Recent Labs  08/26/16 1058  PROBNP 43   HbA1C: No results for input(s): HGBA1C in the last 72 hours. CBG: No results for input(s): GLUCAP in the last 168 hours. Lipid Profile: No results for input(s): CHOL, HDL, LDLCALC, TRIG, CHOLHDL, LDLDIRECT in the last 72 hours. Thyroid Function Tests: No results for input(s): TSH, T4TOTAL, FREET4, T3FREE, THYROIDAB in the last 72 hours. Anemia Panel: No results for input(s): VITAMINB12, FOLATE, FERRITIN, TIBC, IRON, RETICCTPCT in the last 72 hours. Sepsis Labs:  Recent Labs Lab 01/14/17 [place] <0.10    Recent Results (from the past 240 hour(s))  Respiratory Panel by PCR     Status: Abnormal   Collection Time: 01/14/17  3:21 PM  Result Value Ref Range Status   Adenovirus NOT DETECTED NOT DETECTED Final   Coronavirus 229E NOT DETECTED NOT DETECTED Final   Coronavirus HKU1 NOT DETECTED NOT DETECTED Final   Coronavirus NL63 NOT DETECTED NOT DETECTED Final   Coronavirus OC43 NOT DETECTED NOT DETECTED Final   Metapneumovirus NOT DETECTED NOT DETECTED Final   Rhinovirus / Enterovirus DETECTED (A) NOT DETECTED Final   Influenza A NOT DETECTED NOT DETECTED Final   Influenza  B NOT DETECTED NOT DETECTED Final   Parainfluenza Virus 1 NOT DETECTED NOT DETECTED Final   Parainfluenza Virus 2 NOT DETECTED NOT DETECTED Final   Parainfluenza Virus 3 NOT DETECTED NOT DETECTED Final   Parainfluenza Virus 4 NOT DETECTED NOT DETECTED Final   Respiratory Syncytial Virus NOT DETECTED NOT DETECTED Final   Bordetella pertussis NOT DETECTED NOT DETECTED Final   Chlamydophila pneumoniae NOT DETECTED NOT DETECTED Final   Mycoplasma pneumoniae NOT DETECTED NOT DETECTED Final       Radiology Studies: No results found.    Scheduled Meds: . aspirin EC  81 mg Oral Daily  . azithromycin  500 mg Oral Daily  . carvedilol  3.125 mg Oral BID WC  . guaiFENesin  1,200 mg Oral BID  .  heparin  5,000 Units Subcutaneous Q8H  . ipratropium-albuterol  3 mL Nebulization TID  . lisinopril  2.5 mg Oral BID  . [START ON 01/17/2017] methylPREDNISolone (SOLU-MEDROL) injection  60 mg Intravenous Q12H  . mometasone-formoterol  2 puff Inhalation BID  . pantoprazole  40 mg Oral Daily  . PARoxetine  25 mg Oral q morning - 10a  . pravastatin  20 mg Oral QPM  . roflumilast  500 mcg Oral Daily  . tiotropium  1 capsule Inhalation Daily   Continuous Infusions:   LOS: 1 day    Time spent: 30 minutes   Dessa Phi, DO [facility] [email or web address] Password [email or web address] 01/16/2017, 1:58 PM

## 2017-01-17 LAB — CBC
HEMATOCRIT: 39.4 % (ref 36.0–46.0)
HEMOGLOBIN: 12.4 g/dL (ref 12.0–15.0)
MCH: 30.2 pg (ref 26.0–34.0)
MCHC: 31.5 g/dL (ref 30.0–36.0)
MCV: 96.1 fL (ref 78.0–100.0)
Platelets: 291 10*3/uL (ref 150–400)
RBC: 4.1 MIL/uL (ref 3.87–5.11)
RDW: 14.6 % (ref 11.5–15.5)
WBC: 21.8 10*3/uL — ABNORMAL HIGH (ref 4.0–10.5)

## 2017-01-17 MED ORDER — BUDESONIDE 0.25 MG/2ML IN SUSP
0.2500 mg | Freq: Two times a day (BID) | RESPIRATORY_TRACT | Status: DC
  Administered 2017-01-17 – 2017-01-18 (×3): 0.25 mg via RESPIRATORY_TRACT
  Filled 2017-01-17 (×3): qty 2

## 2017-01-17 NOTE — Progress Notes (Signed)
PROGRESS NOTE    Kim [name]  CVE:[id number] DOB: [birth year] DOA: 01/14/2017 PCP: Marletta Lor, MD     Brief Narrative:  Kim [name] is a 61 y.o. female with medical history significant of patient reports approximately 1 week history of increasing shortness of breath, productive cough, and increasing home O2 usage. Patient was seen by her primary care physician approximally 5 days ago who started her on 40 mg of prednisone daily. She's been compliant with this however without any improvement. Patient then saw her pulmonologist on the day prior to admission who increased her steroids to 60 mg. Patient took 1 dose night prior to admission without improvement. Patient is used increasing home breathing treatments without improvement. EMS was called to the home to evaluate patient. On arrival, patient's O2 sats were ~80%. She was given Solu-Medrol and 2 g of magnesium as well as 10 mg of albuterol and with improvement. She has been admitted to the hospital due to COPD exacerbation.  Assessment & Plan:   Active Problems:   COPD exacerbation (HCC)   Chronic respiratory failure (HCC)   HLD (hyperlipidemia)   Sleep apnea   Dependent edema  Acute on chronic respiratory failure secondary to COPD exacerbation -Respiratory viral panel +rhinovirus -CXR: No acute cardiopulmonary abnormality seen. -Continue duo nebs, IV steroids, add Pulmicort.  Hold her home inhalers for now -Continue Solu-Medrol, DuoNeb, azithromycin. -Wean nasal cannula O2 as able  Leukocytosis -Secondary to steroid use, trend  Essential hypertension -Continue lisinopril, Coreg  CAD -Continue aspirin and statin  Chronic pain and anxiety -Continue home Norco, Xanax   DVT prophylaxis: Patient has been refusing her heparin, placed on SCDs Code Status: Full Family Communication: husband at bedside Disposition Plan: pending improvement, back home   Consultants:   none  Procedures:    none  Antimicrobials:  Azithromycin 10/11 >>  Subjective: - no chest pain, no abdominal pain, nausea or vomiting.  Ongoing shortness of breath  Objective: Vitals:   01/16/17 2121 01/17/17 0541 01/17/17 0831 01/17/17 1206  BP: 135/65 136/66    Pulse: 91 73    Resp: 20 20    Temp: 97.7 F (36.5 C) 97.8 F (36.6 C)    TempSrc: Oral Oral    SpO2: 93%  93% 95%  Weight:      Height:        Intake/Output Summary (Last 24 hours) at 01/17/17 1336 Last data filed at 01/17/17 0603  Gross per 24 hour  Intake              120 ml  Output                0 ml  Net              120 ml   Filed Weights   01/14/17 1118  Weight: 58.1 kg (128 lb)    Examination:  Constitutional: NAD, calm, comfortable Eyes: lids and conjunctivae normal ENMT: Mucous membranes are moist.  Neck: normal, supple Respiratory: Diffuse bilateral wheezing, no crackles.  Cardiovascular: Regular rate and rhythm, no murmurs / rubs / gallops. No LE edema. 2+ pedal pulses.  Abdomen: no tenderness. Bowel sounds positive.  Skin: no rashes, lesions, ulcers. No induration Neurologic: non focal   Data Reviewed: I have personally reviewed following labs and imaging studies  CBC:  Recent Labs Lab 01/14/17 1120 01/15/17 0645 01/16/17 0500 01/17/17 0547  WBC 19.2* 20.0* 24.8* 21.8*  NEUTROABS 10.2*  --   --   --  HGB 14.0 13.9 12.9 12.4  HCT 43.6 43.6 40.2 39.4  MCV 95.8 96.0 95.9 96.1  PLT 324 316 296 580   Basic Metabolic Panel:  Recent Labs Lab 01/14/17 1120 01/15/17 0645  NA 140 139  K 3.8 4.4  CL 102 102  CO2 27 28  GLUCOSE 103* 171*  BUN 26* 22*  CREATININE 0.95 0.93  CALCIUM 9.0 9.4   GFR: Estimated Creatinine Clearance: 51.9 mL/min (by C-G formula based on SCr of 0.93 mg/dL). Liver Function Tests: No results for input(s): AST, ALT, ALKPHOS, BILITOT, PROT, ALBUMIN in the last 168 hours. No results for input(s): LIPASE, AMYLASE in the last 168 hours. No results for input(s): AMMONIA  in the last 168 hours. Coagulation Profile: No results for input(s): INR, PROTIME in the last 168 hours. Cardiac Enzymes: No results for input(s): CKTOTAL, CKMB, CKMBINDEX, TROPONINI in the last 168 hours. BNP (last 3 results)  Recent Labs  08/26/16 1058  PROBNP 43   HbA1C: No results for input(s): HGBA1C in the last 72 hours. CBG: No results for input(s): GLUCAP in the last 168 hours. Lipid Profile: No results for input(s): CHOL, HDL, LDLCALC, TRIG, CHOLHDL, LDLDIRECT in the last 72 hours. Thyroid Function Tests: No results for input(s): TSH, T4TOTAL, FREET4, T3FREE, THYROIDAB in the last 72 hours. Anemia Panel: No results for input(s): VITAMINB12, FOLATE, FERRITIN, TIBC, IRON, RETICCTPCT in the last 72 hours. Sepsis Labs:  Recent Labs Lab 01/14/17 [place] <0.10    Recent Results (from the past 240 hour(s))  Respiratory Panel by PCR     Status: Abnormal   Collection Time: 01/14/17  3:21 PM  Result Value Ref Range Status   Adenovirus NOT DETECTED NOT DETECTED Final   Coronavirus 229E NOT DETECTED NOT DETECTED Final   Coronavirus HKU1 NOT DETECTED NOT DETECTED Final   Coronavirus NL63 NOT DETECTED NOT DETECTED Final   Coronavirus OC43 NOT DETECTED NOT DETECTED Final   Metapneumovirus NOT DETECTED NOT DETECTED Final   Rhinovirus / Enterovirus DETECTED (A) NOT DETECTED Final   Influenza A NOT DETECTED NOT DETECTED Final   Influenza B NOT DETECTED NOT DETECTED Final   Parainfluenza Virus 1 NOT DETECTED NOT DETECTED Final   Parainfluenza Virus 2 NOT DETECTED NOT DETECTED Final   Parainfluenza Virus 3 NOT DETECTED NOT DETECTED Final   Parainfluenza Virus 4 NOT DETECTED NOT DETECTED Final   Respiratory Syncytial Virus NOT DETECTED NOT DETECTED Final   Bordetella pertussis NOT DETECTED NOT DETECTED Final   Chlamydophila pneumoniae NOT DETECTED NOT DETECTED Final   Mycoplasma pneumoniae NOT DETECTED NOT DETECTED Final       Radiology Studies: No results  found.    Scheduled Meds: . aspirin EC  81 mg Oral Daily  . azithromycin  500 mg Oral Daily  . budesonide (PULMICORT) nebulizer solution  0.25 mg Nebulization BID  . carvedilol  3.125 mg Oral BID WC  . guaiFENesin  1,200 mg Oral BID  . heparin  5,000 Units Subcutaneous Q8H  . ipratropium-albuterol  3 mL Nebulization TID  . lisinopril  2.5 mg Oral BID  . methylPREDNISolone (SOLU-MEDROL) injection  60 mg Intravenous Q12H  . pantoprazole  40 mg Oral Daily  . PARoxetine  25 mg Oral q morning - 10a  . pravastatin  20 mg Oral QPM  . roflumilast  500 mcg Oral Daily   Continuous Infusions:   LOS: 2 days   Genelda Roark M. Cruzita Lederer, MD [facility] [phone]  If 7PM-7AM, please contact night-coverage [email or web address] Password  [email or web address]

## 2017-01-18 LAB — BASIC METABOLIC PANEL
ANION GAP: 9 (ref 5–15)
BUN: 23 mg/dL — ABNORMAL HIGH (ref 6–20)
CHLORIDE: 101 mmol/L (ref 101–111)
CO2: 28 mmol/L (ref 22–32)
CREATININE: 0.74 mg/dL (ref 0.44–1.00)
Calcium: 8.9 mg/dL (ref 8.9–10.3)
GFR calc non Af Amer: >60 mL/min (ref >60)
Glucose, Bld: 131 mg/dL — ABNORMAL HIGH (ref 65–99)
POTASSIUM: 4.3 mmol/L (ref 3.5–5.1)
Sodium: 138 mmol/L (ref 135–145)

## 2017-01-18 LAB — CBC
HEMATOCRIT: 39.9 % (ref 36.0–46.0)
HEMOGLOBIN: 12.8 g/dL (ref 12.0–15.0)
MCH: 30.4 pg (ref 26.0–34.0)
MCHC: 32.1 g/dL (ref 30.0–36.0)
MCV: 94.8 fL (ref 78.0–100.0)
Platelets: 282 10*3/uL (ref 150–400)
RBC: 4.21 MIL/uL (ref 3.87–5.11)
RDW: 14.5 % (ref 11.5–15.5)
WBC: 16.7 10*3/uL — AB (ref 4.0–10.5)

## 2017-01-18 MED ORDER — PREDNISONE 10 MG PO TABS: 60.0000 mg | ORAL_TABLET | Freq: Every day | ORAL | 0 refills | Status: DC

## 2017-01-18 NOTE — Discharge Summary (Signed)
Physician Discharge Summary  Kim [name] VOJ:[id number] DOB: 1956-01-24 DOA: 01/14/2017  PCP: Kim Lor, MD  Admit date: 01/14/2017 Discharge date: 01/18/2017  Admitted From: home Disposition:  home  Recommendations for Outpatient Follow-up:  1. Follow up with Dr. Ashok [name] in 1 day 2. Prednisone taper on discharge  Home Health: none Equipment/Devices: none  Discharge Condition: stable CODE STATUS: Full code Diet recommendation: regular  HPI: Per Dr. Marily [name], Kim [name] is a 61 y.o. female with medical history significant of patient reports approximately 1 week history of increasing shortness of breath, productive cough, and increasing home O2 usage. Patient was seen by her primary care physician proximally 5 days ago who started her on 40 mg of prednisone daily. She's been compliant with this without any improvement. Patient then saw her pulmonologist on the day prior to admission who increased her steroids to 60 mg. Patient took 1 dose night prior to admission without improvement. Patient is used increasing home breathing treatments without improvement. States that "I feel like he cannot breathe." EMS was called to the home to evaluate patient. On arrival patient's O2 sats saturations were approximate 80%. Given Solu-Medrol and 2 g of magnesium as well as 10 mg of albuterol and with improvement. Denies fever. Denies chest pain, palpitations, abdominal pain, dysuria comfort, flank pain, neck stiffness, focal neurological deficit.  Patient and husband both concerned about elevation in blood pressure readings. Typically worse in the mornings. Patient's husband states that she does snore and is occasionally apneic at night.  Hospital Course: Discharge Diagnoses:  Active Problems:   COPD exacerbation (HCC)   Chronic respiratory failure (HCC)   HLD (hyperlipidemia)   Sleep apnea   Dependent edema   Acute on chronic respiratory failure secondary to COPD  exacerbation -patient was admitted to the hospital with COPD exacerbation, respiratory viral panel was positive for rhinovirus.  She was placed on IV steroids, nebulizers and empirically started on azithromycin.  She improved, she was able to be weaned off to room air, her wheezing has improved significantly and she was able to ambulate in the hallway without further breathing difficulties and feeling back to baseline.  She has follow-up with Dr. Ashok [name] with pulmonology tomorrow, and she plans to keep that appointment, currently she is at baseline and will be discharged home in stable condition.  She finished 5 days of antibiotics while in the hospital, and will go home on a prednisone taper Leukocytosis -Secondary to steroid use Essential hypertension -Continue lisinopril, Coreg CAD -Continue aspirin and statin Chronic pain and anxiety -Continue home Norco, Xanax  Discharge Instructions   Allergies as of 01/18/2017      Reactions   Penicillins Hives   Has patient had a PCN reaction causing immediate rash, facial/tongue/throat swelling, SOB or lightheadedness with hypotension: Yes Has patient had a PCN reaction causing severe rash involving mucus membranes or skin necrosis: No Has patient had a PCN reaction that required hospitalization No Has patient had a PCN reaction occurring within the last 10 years: No If all of the above answers are "NO", then may proceed with Cephalosporin use.      Medication List    TAKE these medications   albuterol (2.5 MG/3ML) 0.083% nebulizer solution Commonly known as:  PROVENTIL Take 2.5 mg by nebulization every 6 (six) hours as needed for wheezing or shortness of breath.   VENTOLIN HFA 108 (90 Base) MCG/ACT inhaler Generic drug:  albuterol INHALE 2 PUFFS INTO THE LUNGS EVERY 4 HOURS AS NEEDED  FOR WHEEZING OR SHORTNESS OF BREATH   ALPRAZolam 0.5 MG tablet Commonly known as:  XANAX TAKE 1/2 TO 1 TABLET BY MOUTH 3 TIMES A DAY   aspirin 81 MG EC  tablet Take 1 tablet (81 mg total) by mouth daily.   benzonatate 100 MG capsule Commonly known as:  TESSALON Take 1 capsule (100 mg total) by mouth 3 (three) times daily as needed for cough.   carvedilol 3.125 MG tablet Commonly known as:  COREG TAKE 1 TABLET BY MOUTH 2 TIMES DAILY WITH MEALS   furosemide 40 MG tablet Commonly known as:  LASIX Take 40 mg by mouth daily as needed (for weight gain of 3 lbs).   guaiFENesin 600 MG 12 hr tablet Commonly known as:  MUCINEX Take 2 tablets (1,200 mg total) by mouth 2 (two) times daily as needed for to loosen phlegm.   HYDROcodone-acetaminophen 7.5-325 MG tablet Commonly known as:  NORCO TK 1 T PO Q 4 H PRN FOR MODERATE PAIN   ipratropium-albuterol 0.5-2.5 (3) MG/3ML Soln Commonly known as:  DUONEB Take 3 mLs by nebulization every 4 (four) hours as needed (cough).   lisinopril 2.5 MG tablet Commonly known as:  PRINIVIL,ZESTRIL Take 2.5 mg by mouth 2 (two) times daily.   nitroGLYCERIN 0.4 MG SL tablet Commonly known as:  NITROSTAT Place 1 tablet (0.4 mg total) under the tongue every 5 (five) minutes as needed for chest pain.   omeprazole 20 MG capsule Commonly known as:  PRILOSEC Take 20 mg by mouth daily.   PAXIL CR 25 MG 24 hr tablet Generic drug:  PARoxetine TAKE 1 TABLET BY MOUTH  EVERY MORNING   pravastatin 20 MG tablet Commonly known as:  PRAVACHOL Take 1 tablet (20 mg total) by mouth every evening.   predniSONE 10 MG tablet Commonly known as:  DELTASONE Take 6 tablets (60 mg total) by mouth daily. 6 pills x 2 days then decrease by 1 pill every 3 days as previously instructed   roflumilast 500 MCG Tabs tablet Commonly known as:  DALIRESP Take 1 tablet (500 mcg total) by mouth daily.   Spacer/Aero Chamber Mouthpiece Misc 1 Device by Does not apply route as directed.   SYMBICORT 160-4.5 MCG/ACT inhaler Generic drug:  budesonide-formoterol inhale 2 puffs by mouth twice a day   Tiotropium Bromide Monohydrate 2.5  MCG/ACT Aers Commonly known as:  SPIRIVA RESPIMAT Inhale 2 puffs into the lungs daily. What changed:  how much to take   vitamin B-12 500 MCG tablet Commonly known as:  CYANOCOBALAMIN Take 500 mcg by mouth 2 (two) times daily.   Vitamin D3 1000 units Caps Take 1 capsule (1,000 Units total) by mouth 2 (two) times daily.      Follow-up Information    Javier Glazier, MD Follow up in 1 day(s).   Specialty:  Pulmonary Disease Contact information: [address] [place] [address] [phone]           Consultations:  None   Procedures/Studies:  Dg Chest Port 1 View  Result Date: 01/14/2017 CLINICAL DATA:  Shortness of breath. EXAM: PORTABLE CHEST 1 VIEW COMPARISON:  Radiographs of December 04, 2016. FINDINGS: The heart size and mediastinal contours are within normal limits. Both lungs are clear. No pneumothorax or pleural effusion is noted. The visualized skeletal structures are unremarkable. IMPRESSION: No acute cardiopulmonary abnormality seen. Electronically Signed   By: Marijo Conception, M.D.   On: 01/14/2017 11:44     Subjective: - no  chest pain, shortness of breath, no abdominal pain, nausea or vomiting.   Discharge Exam: Vitals:   01/18/17 0840 01/18/17 1409  BP:    Pulse:    Resp:    Temp:    SpO2: 96% 96%    General: Pt is alert, awake, not in acute distress Cardiovascular: RRR, S1/S2 +, no rubs, no gallops Respiratory: Overall decreased breath sounds, faint wheezing - significantly improved, no crackles Abdominal: Soft, NT, ND, bowel sounds + Extremities: no edema, no cyanosis    The results of significant diagnostics from this hospitalization (including imaging, microbiology, ancillary and laboratory) are listed below for reference.     Microbiology: Recent Results (from the past 240 hour(s))  Respiratory Panel by PCR     Status: Abnormal   Collection Time: 01/14/17  3:21 PM  Result Value Ref Range Status   Adenovirus NOT  DETECTED NOT DETECTED Final   Coronavirus 229E NOT DETECTED NOT DETECTED Final   Coronavirus HKU1 NOT DETECTED NOT DETECTED Final   Coronavirus NL63 NOT DETECTED NOT DETECTED Final   Coronavirus OC43 NOT DETECTED NOT DETECTED Final   Metapneumovirus NOT DETECTED NOT DETECTED Final   Rhinovirus / Enterovirus DETECTED (A) NOT DETECTED Final   Influenza A NOT DETECTED NOT DETECTED Final   Influenza B NOT DETECTED NOT DETECTED Final   Parainfluenza Virus 1 NOT DETECTED NOT DETECTED Final   Parainfluenza Virus 2 NOT DETECTED NOT DETECTED Final   Parainfluenza Virus 3 NOT DETECTED NOT DETECTED Final   Parainfluenza Virus 4 NOT DETECTED NOT DETECTED Final   Respiratory Syncytial Virus NOT DETECTED NOT DETECTED Final   Bordetella pertussis NOT DETECTED NOT DETECTED Final   Chlamydophila pneumoniae NOT DETECTED NOT DETECTED Final   Mycoplasma pneumoniae NOT DETECTED NOT DETECTED Final     Labs: BNP (last 3 results) No results for input(s): BNP in the last 8760 hours. Basic Metabolic Panel:  Recent Labs Lab 01/14/17 1120 01/15/17 0645 01/18/17 0546  NA 140 139 138  K 3.8 4.4 4.3  CL 102 102 101  CO2 27 28 28   GLUCOSE 103* 171* 131*  BUN 26* 22* 23*  CREATININE 0.95 0.93 0.74  CALCIUM 9.0 9.4 8.9   Liver Function Tests: No results for input(s): AST, ALT, ALKPHOS, BILITOT, PROT, ALBUMIN in the last 168 hours. No results for input(s): LIPASE, AMYLASE in the last 168 hours. No results for input(s): AMMONIA in the last 168 hours. CBC:  Recent Labs Lab 01/14/17 1120 01/15/17 0645 01/16/17 0500 01/17/17 0547 01/18/17 0546  WBC 19.2* 20.0* 24.8* 21.8* 16.7*  NEUTROABS 10.2*  --   --   --   --   HGB 14.0 13.9 12.9 12.4 12.8  HCT 43.6 43.6 40.2 39.4 39.9  MCV 95.8 96.0 95.9 96.1 94.8  PLT 324 316 296 291 282   Cardiac Enzymes: No results for input(s): CKTOTAL, CKMB, CKMBINDEX, TROPONINI in the last 168 hours. BNP: Invalid input(s): POCBNP CBG: No results for input(s):  GLUCAP in the last 168 hours. D-Dimer No results for input(s): DDIMER in the last 72 hours. Hgb A1c No results for input(s): HGBA1C in the last 72 hours. Lipid Profile No results for input(s): CHOL, HDL, LDLCALC, TRIG, CHOLHDL, LDLDIRECT in the last 72 hours. Thyroid function studies No results for input(s): TSH, T4TOTAL, T3FREE, THYROIDAB in the last 72 hours.  Invalid input(s): FREET3 Anemia work up No results for input(s): VITAMINB12, FOLATE, FERRITIN, TIBC, IRON, RETICCTPCT in the last 72 hours. Urinalysis    Component Value Date/Time  COLORURINE yellow 02/26/2010 0951   APPEARANCEUR Clear 02/26/2010 0951   LABSPEC 1.025 02/26/2010 0951   PHURINE 5.0 02/26/2010 0951   HGBUR trace-lysed 02/26/2010 0951   BILIRUBINUR neg 03/07/2016 1041   PROTEINUR neg 03/07/2016 1041   UROBILINOGEN 0.2 03/07/2016 1041   UROBILINOGEN 0.2 02/26/2010 0951   NITRITE neg 03/07/2016 1041   NITRITE negative 02/26/2010 0951   LEUKOCYTESUR Negative 03/07/2016 1041   Sepsis Labs Invalid input(s): PROCALCITONIN,  WBC,  LACTICIDVEN   Time coordinating discharge: 40 minutes  SIGNED:  Marzetta Board, MD  [facility] 01/18/2017, 2:22 PM Pager [phone]  If 7PM-7AM, please contact night-coverage [email or web address] Password [email or web address]

## 2017-01-18 NOTE — Progress Notes (Signed)
NURSING PROGRESS NOTE  Kim [name] [id number] Discharge Data: 01/18/2017 2:30 PM Attending Provider: No att. providers found XYB:[id number], Doretha Sou, MD   [name] to be D/C'd Home per MD order.    All IV's will be discontinued and monitored for bleeding.  All belongings will be returned to patient for patient to take home.  Last Documented Vital Signs:  Blood pressure (!) 155/61, pulse 62, temperature 98.3 F (36.8 C), resp. rate 18, height 5\' 3"  (1.6 m), weight 58.1 kg (128 lb), SpO2 96 %.  Talbert Forest RN

## 2017-01-19 ENCOUNTER — Telehealth (HOSPITAL_COMMUNITY): Payer: Self-pay

## 2017-01-19 ENCOUNTER — Ambulatory Visit: Payer: 59 | Admitting: Emergency Medicine

## 2017-01-19 ENCOUNTER — Ambulatory Visit (INDEPENDENT_AMBULATORY_CARE_PROVIDER_SITE_OTHER): Payer: 59 | Admitting: Pulmonary Disease

## 2017-01-19 ENCOUNTER — Ambulatory Visit: Payer: 59 | Admitting: Podiatry

## 2017-01-19 VITALS — BP 138/80 | HR 81 | Ht 63.0 in | Wt 126.1 lb

## 2017-01-19 DIAGNOSIS — E8801 Alpha-1-antitrypsin deficiency: Secondary | ICD-10-CM

## 2017-01-19 DIAGNOSIS — J441 Chronic obstructive pulmonary disease with (acute) exacerbation: Secondary | ICD-10-CM

## 2017-01-19 DIAGNOSIS — J9621 Acute and chronic respiratory failure with hypoxia: Secondary | ICD-10-CM

## 2017-01-19 DIAGNOSIS — Z148 Genetic carrier of other disease: Secondary | ICD-10-CM | POA: Insufficient documentation

## 2017-01-19 NOTE — Telephone Encounter (Signed)
Verified insurance. No co-pay, no deductible, out of pocket amount is $5,000/$5,000 has been met, no co-insurance, and no pre-authorization required. Reference # [id number] °

## 2017-01-19 NOTE — Telephone Encounter (Signed)
ERROR on below note.. Orientation is on 02/09/2017 at 9:30am.

## 2017-01-19 NOTE — Progress Notes (Signed)
Subjective:    Patient ID: Kim [name], female    DOB: Nov 03, 1955, 61 y.o.   MRN: [id number]\  C.C.:  Follow-up for known Severe COPD w/ Acute Exacerbation & Acute on Chronic Hypoxic Respiratory Failure.   HPI Patient followed by Dr. Lamonte Sakai. Electronic medical record previously reviewed. Patient hospitalized and subsequently discharged on 12/05/16 with several day history of increasing shortness of breath based on discharge summary. At that time the patient had not been using her prescribed oxygen therapy. Transiently was on noninvasive positive pressure ventilation. Also note patient was evaluated in July in the emergency department for a COPD exacerbation. After last office visit with me patient was hospitalized on 10/10 with acute exacerbation of COPD as well.  Severe COPD with acute exacerbation: Patient was started on prolonged steroid taper after IM injection of Depo-Medrol at last appointment. Clinical status worsened & patient hospitalized 48 hours later. Treated with steroids and nebulizer therapies in hospital. She also received a 5 day course of Azithromycin.  She did not take the [place] with concern over the depression. She feels like her dyspnea has improved significantly. She still has an intermittent cough and mild wheezing. She is continuing to take Mucinex daily. She is continuing to take the Previously prescribed Prednisone taper because she only took 1 day prior to her hospitalization.  Acute on chronic hypoxic respiratory failure: Previously prescribed oxygen at 2 L/m with exertion. Patient was noted to be hypoxic on EMS arrival for transport to hospital on 10/10. Review of the electronic medical record does not indicate a walk test was performed prior to discharge. She is still using her oxygen intermittently.   Review of Systems No fever, chills, or sweats. No chest tightness, pressure, or pain. No abdominal pain or nausea.   Allergies  Allergen Reactions  . Penicillins  Hives    Has patient had a PCN reaction causing immediate rash, facial/tongue/throat swelling, SOB or lightheadedness with hypotension: Yes Has patient had a PCN reaction causing severe rash involving mucus membranes or skin necrosis: No Has patient had a PCN reaction that required hospitalization No Has patient had a PCN reaction occurring within the last 10 years: No If all of the above answers are "NO", then may proceed with Cephalosporin use.     Current Outpatient Prescriptions on File Prior to Visit  Medication Sig Dispense Refill  . albuterol (PROVENTIL) (2.5 MG/3ML) 0.083% nebulizer solution Take 2.5 mg by nebulization every 6 (six) hours as needed for wheezing or shortness of breath.    . ALPRAZolam (XANAX) 0.5 MG tablet TAKE 1/2 TO 1 TABLET BY MOUTH 3 TIMES A DAY 90 tablet 0  . aspirin EC 81 MG EC tablet Take 1 tablet (81 mg total) by mouth daily.    . benzonatate (TESSALON) 100 MG capsule Take 1 capsule (100 mg total) by mouth 3 (three) times daily as needed for cough. 60 capsule 0  . carvedilol (COREG) 3.125 MG tablet TAKE 1 TABLET BY MOUTH 2 TIMES DAILY WITH MEALS 60 tablet 9  . Cholecalciferol (VITAMIN D3) 1000 units CAPS Take 1 capsule (1,000 Units total) by mouth 2 (two) times daily. 60 capsule 6  . furosemide (LASIX) 40 MG tablet Take 40 mg by mouth daily as needed (for weight gain of 3 lbs).   0  . guaiFENesin (MUCINEX) 600 MG 12 hr tablet Take 2 tablets (1,200 mg total) by mouth 2 (two) times daily as needed for to loosen phlegm. 30 tablet 0  . HYDROcodone-acetaminophen ([place])  7.5-325 MG tablet TK 1 T PO Q 4 H PRN FOR MODERATE PAIN 90 tablet 0  . ipratropium-albuterol (DUONEB) 0.5-2.5 (3) MG/3ML SOLN Take 3 mLs by nebulization every 4 (four) hours as needed (cough). 120 mL 3  . lisinopril (PRINIVIL,ZESTRIL) 2.5 MG tablet Take 2.5 mg by mouth 2 (two) times daily.  1  . nitroGLYCERIN (NITROSTAT) 0.4 MG SL tablet Place 1 tablet (0.4 mg total) under the tongue every 5 (five)  minutes as needed for chest pain. 25 tablet 3  . omeprazole (PRILOSEC) 20 MG capsule Take 20 mg by mouth daily.  0  . PAXIL CR 25 MG 24 hr tablet TAKE 1 TABLET BY MOUTH  EVERY MORNING 90 tablet 1  . pravastatin (PRAVACHOL) 20 MG tablet Take 1 tablet (20 mg total) by mouth every evening. 30 tablet 11  . predniSONE (DELTASONE) 10 MG tablet Take 6 tablets (60 mg total) by mouth daily. 6 pills x 2 days then decrease by 1 pill every 3 days as previously instructed 30 tablet 0  . roflumilast (DALIRESP) 500 MCG TABS tablet Take 1 tablet (500 mcg total) by mouth daily. 1 tablet 0  . Spacer/Aero Chamber Mouthpiece MISC 1 Device by Does not apply route as directed. 1 each 0  . SYMBICORT 160-4.5 MCG/ACT inhaler inhale 2 puffs by mouth twice a day 10.2 Inhaler 5  . Tiotropium Bromide Monohydrate (SPIRIVA RESPIMAT) 2.5 MCG/ACT AERS Inhale 2 puffs into the lungs daily. (Patient taking differently: Inhale 1 puff into the lungs daily. ) 1 Inhaler 0  . VENTOLIN HFA 108 (90 Base) MCG/ACT inhaler INHALE 2 PUFFS INTO THE LUNGS EVERY 4 HOURS AS NEEDED FOR WHEEZING OR SHORTNESS OF BREATH 18 Inhaler 2  . vitamin B-12 (CYANOCOBALAMIN) 500 MCG tablet Take 500 mcg by mouth 2 (two) times daily.     No current facility-administered medications on file prior to visit.     Past Medical History:  Diagnosis Date  . C8 RADICULOPATHY 06/05/2009  . COPD 08/31/2007  . DEPRESSION 11/19/2006  . DYSLIPIDEMIA 03/12/2009  . MI (mitral incompetence)   . 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 OSTEOARTHRITIS 06/05/2009  . PARESTHESIA 08/24/2007  . TOBACCO ABUSE 01/25/2010    Past Surgical History:  Procedure Laterality Date  . APPENDECTOMY  1969  . CARDIAC CATHETERIZATION N/A 07/05/2015   Procedure: Left Heart Cath and Coronary Angiography;  Surgeon: Leonie Man, MD;  Location: [facility];  Service: Cardiovascular;  Laterality: N/A;  . CHOLECYSTECTOMY  1989  . collapse lung  1990  . EXPLORATORY LAPAROTOMY  1969    Family History   Problem Relation Age of Onset  . Cancer Mother        lung  . Heart attack Mother   . Hypertension Sister   . Multiple sclerosis Brother   . Diabetes Sister   . Rheum arthritis Sister   . Thyroid disease Sister   . Multiple sclerosis Other   . Alcohol abuse Other   . Arthritis Other   . Diabetes Other   . Kidney disease Other   . Cancer Other        lung,ovarian,skin, uterine  . Stroke Other   . Heart disease Other   . Melanoma Other   . Heart attack Maternal Uncle   . Heart attack Other        NEICE  . Heart attack Other        NEPHEW  . Hypertension Brother   . Stroke Maternal Grandmother   . Colon  cancer Neg Hx     Social History   Social History  . Marital status: Married    Spouse name: N/A  . Number of children: 2  . Years of education: N/A   Occupational History  . Patient care aide-sits with elderly lady    Social History Main Topics  . Smoking status: Former Smoker    Packs/day: 2.00    Years: 38.00    Types: Cigarettes    Quit date: 03/07/2014  . Smokeless tobacco: Never Used  . Alcohol use No  . Drug use: No  . Sexual activity: Not on file   Other Topics Concern  . Not on file   Social History Narrative   [facility] (01/12/17):   Originally from [facility]. Has lived in [place] for 17 years. Married. Has 2 dogs currently. No mold and only 1 indoor plant. Had her home professionally cleaned a month ago. No bird or hot tub exposure. 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       Objective:   Physical Exam BP 138/80 (BP Location: Right Arm, Cuff Size: Normal)   Pulse 81   Ht 5\' 3"  (1.6 m)   Wt 126 lb 2 oz (57.2 kg)   SpO2 96%   BMI 22.34 kg/m   General:  Awake. Alert. No distress. Accompanied by mother today.  Integument:  Warm. Dry. No rash.  Extremities:  No cyanosis or clubbing.  HEENT:  No scleral icterus. No oral ulcers. Moist mucus membranes.  Cardiovascular:  Regular rate. No edema. Regular rhythm.    Pulmonary:  Prolonged exhalation bilaterally. Normal work of breathing on room air. Diffuse wheezing during 50% of exhalation phase.  Abdomen: Soft. Normal bowel sounds. Nondistended.  Musculoskeletal:  Normal bulk and tone. No joint deformity or effusion appreciated. Neurological:  Cranial nerves 2-12 grossly in tact. No meningismus. Moving all 4 extremities equally.   PFT 11/16/14: FVC 2.00 L (63%) FEV1 1.00 L (41%) FEV1/FVC 0.50 FEF 25-75 0.42 L (18%) negative bronchodilator response TLC 5.06 L (103%) RV 140% DLCO uncorrected 51%  WALK TEST 01/19/17:  Walked 3 laps / Baseline Sat 95% on Room Air / Nadir Sat 91% on Room Air  IMAGING PORT CXR 01/14/17 (personally reviewed by me):  Hyperinflation suggested by flattening of the diaphragms. No focal opacity appreciated. No pleural effusion appreciated.  MICROBIOLOGY Respiratory Viral Panel PCR 01/14/17:  Rhinovirus  LABS 01/12/17 Alpha-1 antitrypsin: M1N (102)    Assessment & Plan:  61 y.o. female admitted after her last appointment with an exacerbation of her underlying Severe COPD. I reviewed her chest x-ray that did not show a focal opacity. She is continuing to improve clinically. We discussed her Alpha-1 carrier status and the need for biological relative testing. I advised her to continue to monitor her oxygen at home and utilize her oxygen as prescribed. I instructed her to contact our office if she had any new breathing problems or questions before her next appointment.   1. Severe COPD with acute exacerbation:  Patient completing Prednisone taper. Continuing Duoneb scheduled for now and then transitioning back to Spiriva after a week. Referring patient to Pulmonary Rehab at [facility]. 2. Acute on chronic hypoxic respiratory failure:  Improving. Continuing patient on oxygen at 2 L/m with exertion as previously prescribed.  3. 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 4. Health maintenance:  Status post Prevnar may 2017, Pneumovax February 2008, & Tdap October 2012. High Dose  Influenza Vaccine given 10/3. 5. Follow-up: Return to clinic in 3 months or sooner if needed.   Sonia Baller Ashok Cordia, M.D. [facility] Pager:  [phone] After 7pm or if no response, call [phone] 11:46 AM 01/19/17

## 2017-01-19 NOTE — Telephone Encounter (Signed)
Received referral. Ready to be processed °

## 2017-01-19 NOTE — Patient Instructions (Addendum)
   Continue using your Duoneb in your nebulizer as prescribed every 6 hours for the next week in place of your Spiriva.  You can go back to using your Spiriva after a week if you feel you've improved even more.  We are putting in a referral to Pulmonary Rehabilitation at [facility]. They will contact you.  Keep an eye on your oxygen at home.   Please call our office if you have any new breathing problems or questions before your next appointment.

## 2017-01-19 NOTE — Telephone Encounter (Signed)
Called patient on behalf of Pulmonary Rehab. Patient is interested in the program. Scheduled orientation for 02/13/17 at 9:30am. She will be attending the 10:30am exercise classes.

## 2017-01-27 ENCOUNTER — Ambulatory Visit (INDEPENDENT_AMBULATORY_CARE_PROVIDER_SITE_OTHER): Payer: 59 | Admitting: Podiatry

## 2017-01-27 ENCOUNTER — Ambulatory Visit (INDEPENDENT_AMBULATORY_CARE_PROVIDER_SITE_OTHER): Payer: 59

## 2017-01-27 ENCOUNTER — Encounter: Payer: Self-pay | Admitting: Podiatry

## 2017-01-27 DIAGNOSIS — M792 Neuralgia and neuritis, unspecified: Secondary | ICD-10-CM | POA: Diagnosis not present

## 2017-01-27 DIAGNOSIS — M659 Synovitis and tenosynovitis, unspecified: Secondary | ICD-10-CM | POA: Diagnosis not present

## 2017-01-27 NOTE — Progress Notes (Signed)
Subjective:    Patient ID: Kim [name], female    DOB: March 27, 1956, 61 y.o.   MRN: [id number]  HPI  61 year old female presents the also concerns of right big toe intermittent discomfort as well as sharp pain on the inside part of her big toe which has been ongoing for the last 3-4 months. She states that she believes she has arthritis to her big toe. She states this happens intermittently nominally continual basis and she has had no recent treatment for this. She denies any recent injury or trauma to the area and she's had no treatment. She denies any shooting pain up into her foot or to the other digits. She has no other concerns today.   Review of Systems  All other systems reviewed and are negative.  Past Medical History:  Diagnosis Date  . C8 RADICULOPATHY 06/05/2009  . COPD 08/31/2007  . DEPRESSION 11/19/2006  . DYSLIPIDEMIA 03/12/2009  . MI (mitral incompetence)   . 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 OSTEOARTHRITIS 06/05/2009  . PARESTHESIA 08/24/2007  . TOBACCO ABUSE 01/25/2010    Past Surgical History:  Procedure Laterality Date  . APPENDECTOMY  1969  . CARDIAC CATHETERIZATION N/A 07/05/2015   Procedure: Left Heart Cath and Coronary Angiography;  Surgeon: Leonie Man, MD;  Location: [facility];  Service: Cardiovascular;  Laterality: N/A;  . CHOLECYSTECTOMY  1989  . collapse lung  1990  . EXPLORATORY LAPAROTOMY  1969     Current Outpatient Prescriptions:  .  albuterol (PROVENTIL) (2.5 MG/3ML) 0.083% nebulizer solution, Take 2.5 mg by nebulization every 6 (six) hours as needed for wheezing or shortness of breath., Disp: , Rfl:  .  ALPRAZolam (XANAX) 0.5 MG tablet, TAKE 1/2 TO 1 TABLET BY MOUTH 3 TIMES A DAY, Disp: 90 tablet, Rfl: 0 .  aspirin EC 81 MG EC tablet, Take 1 tablet (81 mg total) by mouth daily., Disp: , Rfl:  .  benzonatate (TESSALON) 100 MG capsule, Take 1 capsule (100 mg total) by mouth 3 (three) times daily as needed for cough., Disp: 60 capsule, Rfl:  0 .  carvedilol (COREG) 3.125 MG tablet, TAKE 1 TABLET BY MOUTH 2 TIMES DAILY WITH MEALS, Disp: 60 tablet, Rfl: 9 .  Cholecalciferol (VITAMIN D3) 1000 units CAPS, Take 1 capsule (1,000 Units total) by mouth 2 (two) times daily., Disp: 60 capsule, Rfl: 6 .  furosemide (LASIX) 40 MG tablet, Take 40 mg by mouth daily as needed (for weight gain of 3 lbs). , Disp: , Rfl: 0 .  guaiFENesin (MUCINEX) 600 MG 12 hr tablet, Take 2 tablets (1,200 mg total) by mouth 2 (two) times daily as needed for to loosen phlegm., Disp: 30 tablet, Rfl: 0 .  HYDROcodone-acetaminophen (NORCO) 7.5-325 MG tablet, TK 1 T PO Q 4 H PRN FOR MODERATE PAIN, Disp: 90 tablet, Rfl: 0 .  ipratropium-albuterol (DUONEB) 0.5-2.5 (3) MG/3ML SOLN, Take 3 mLs by nebulization every 4 (four) hours as needed (cough)., Disp: 120 mL, Rfl: 3 .  lisinopril (PRINIVIL,ZESTRIL) 2.5 MG tablet, Take 2.5 mg by mouth 2 (two) times daily., Disp: , Rfl: 1 .  nitroGLYCERIN (NITROSTAT) 0.4 MG SL tablet, Place 1 tablet (0.4 mg total) under the tongue every 5 (five) minutes as needed for chest pain., Disp: 25 tablet, Rfl: 3 .  omeprazole (PRILOSEC) 20 MG capsule, Take 20 mg by mouth daily., Disp: , Rfl: 0 .  PAXIL CR 25 MG 24 hr tablet, TAKE 1 TABLET BY MOUTH  EVERY MORNING, Disp: 90 tablet,  Rfl: 1 .  pravastatin (PRAVACHOL) 20 MG tablet, Take 1 tablet (20 mg total) by mouth every evening., Disp: 30 tablet, Rfl: 11 .  predniSONE (DELTASONE) 10 MG tablet, Take 6 tablets (60 mg total) by mouth daily. 6 pills x 2 days then decrease by 1 pill every 3 days as previously instructed, Disp: 30 tablet, Rfl: 0 .  roflumilast (DALIRESP) 500 MCG TABS tablet, Take 1 tablet (500 mcg total) by mouth daily., Disp: 1 tablet, Rfl: 0 .  Spacer/Aero Chamber Mouthpiece MISC, 1 Device by Does not apply route as directed., Disp: 1 each, Rfl: 0 .  SYMBICORT 160-4.5 MCG/ACT inhaler, inhale 2 puffs by mouth twice a day, Disp: 10.2 Inhaler, Rfl: 5 .  Tiotropium Bromide Monohydrate (SPIRIVA  RESPIMAT) 2.5 MCG/ACT AERS, Inhale 2 puffs into the lungs daily. (Patient taking differently: Inhale 1 puff into the lungs daily. ), Disp: 1 Inhaler, Rfl: 0 .  VENTOLIN HFA 108 (90 Base) MCG/ACT inhaler, INHALE 2 PUFFS INTO THE LUNGS EVERY 4 HOURS AS NEEDED FOR WHEEZING OR SHORTNESS OF BREATH, Disp: 18 Inhaler, Rfl: 2 .  vitamin B-12 (CYANOCOBALAMIN) 500 MCG tablet, Take 500 mcg by mouth 2 (two) times daily., Disp: , Rfl:   Allergies  Allergen Reactions  . Penicillins Hives    Has patient had a PCN reaction causing immediate rash, facial/tongue/throat swelling, SOB or lightheadedness with hypotension: Yes Has patient had a PCN reaction causing severe rash involving mucus membranes or skin necrosis: No Has patient had a PCN reaction that required hospitalization No Has patient had a PCN reaction occurring within the last 10 years: No If all of the above answers are "NO", then may proceed with Cephalosporin use.     Social History   Social History  . Marital status: Married    Spouse name: N/A  . Number of children: 2  . Years of education: N/A   Occupational History  . Patient care aide-sits with elderly lady    Social History Main Topics  . Smoking status: Former Smoker    Packs/day: 2.00    Years: 38.00    Types: Cigarettes    Quit date: 03/07/2014  . Smokeless tobacco: Never Used  . Alcohol use No  . Drug use: No  . Sexual activity: Not on file   Other Topics Concern  . Not on file   Social History Narrative   [facility] (01/12/17):   Originally from [facility] [place]. Has lived in [place] for 17 years. Married. Has 2 dogs currently. No mold and only 1 indoor plant. Had her home professionally cleaned a month ago. No bird or hot tub exposure. 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         Objective:   Physical Exam  General: AAO x3, NAD  Dermatological: Skin is warm, dry and supple bilateral. Nails x 10 are well manicured; remaining integument  appears unremarkable at this time. There are no open sores, no preulcerative lesions, no rash or signs of infection present.  Vascular: Dorsalis Pedis artery and Posterior Tibial artery pedal pulses are 2/4 bilateral with immedate capillary fill time. PThere is no pain with calf compression, swelling, warmth, erythema.   Neruologic: Grossly intact via light touch bilateral. Sensation intact with Derrel Nip monofilament  Musculoskeletal: There is a small bony prominence off the dorsal medial aspect of the IPJ and this is where she is in sharp pains at times. There is no area pinpoint tenderness or pain the vibratory sensation.  Minimal bunion deformities present. There is no other areas of tenderness and for this time. No pain with IPJ or IPJ range of motion. Muscular strength 5/5 in all groups tested bilateral.  Gait: Unassisted, Nonantalgic.     Assessment & Plan:  61 year old female with neuritis symptoms left hallux -Treatment options discussed including all alternatives, risks, and complications -Etiology of symptoms were discussed -X-rays were obtained and reviewed with the patient. On the lateral view there is a small bony prominence at the dorsal proximal distal phalanx -Offloading pads were dispensed -Discussed shoe gear modifications and possible inserts -Monitor for any worsening or if is not resolved next several weeks to call the office or sooner if any issues are to arise.  Celesta Gentile, DPM

## 2017-01-29 DIAGNOSIS — M792 Neuralgia and neuritis, unspecified: Secondary | ICD-10-CM | POA: Insufficient documentation

## 2017-01-31 ENCOUNTER — Other Ambulatory Visit: Payer: Self-pay | Admitting: Pulmonary Disease

## 2017-02-02 ENCOUNTER — Telehealth: Payer: Self-pay | Admitting: Pulmonary Disease

## 2017-02-02 DIAGNOSIS — J441 Chronic obstructive pulmonary disease with (acute) exacerbation: Secondary | ICD-10-CM

## 2017-02-02 MED ORDER — ALBUTEROL SULFATE (2.5 MG/3ML) 0.083% IN NEBU: 2.5000 mg | INHALATION_SOLUTION | Freq: Four times a day (QID) | RESPIRATORY_TRACT | 3 refills | Status: DC | PRN

## 2017-02-02 NOTE — Telephone Encounter (Signed)
Spoke with pt and advised rx sent to the pharmacy and DME order sent for nebulizer supplies. Nothing further is needed.

## 2017-02-09 ENCOUNTER — Encounter (HOSPITAL_COMMUNITY): Payer: Self-pay

## 2017-02-09 ENCOUNTER — Encounter (HOSPITAL_COMMUNITY)
Admission: RE | Admit: 2017-02-09 | Discharge: 2017-02-09 | Disposition: A | Discharge status: Home / Self Care | Payer: 59 | Source: Ambulatory Visit | Attending: Pulmonary Disease | Admitting: Pulmonary Disease

## 2017-02-09 VITALS — BP 114/74 | HR 95

## 2017-02-09 DIAGNOSIS — E785 Hyperlipidemia, unspecified: Secondary | ICD-10-CM | POA: Insufficient documentation

## 2017-02-09 DIAGNOSIS — Z7982 Long term (current) use of aspirin: Secondary | ICD-10-CM | POA: Insufficient documentation

## 2017-02-09 DIAGNOSIS — J441 Chronic obstructive pulmonary disease with (acute) exacerbation: Secondary | ICD-10-CM | POA: Insufficient documentation

## 2017-02-09 DIAGNOSIS — Z87891 Personal history of nicotine dependence: Secondary | ICD-10-CM | POA: Insufficient documentation

## 2017-02-09 DIAGNOSIS — Z79899 Other long term (current) drug therapy: Secondary | ICD-10-CM | POA: Insufficient documentation

## 2017-02-09 DIAGNOSIS — F329 Major depressive disorder, single episode, unspecified: Secondary | ICD-10-CM | POA: Insufficient documentation

## 2017-02-09 DIAGNOSIS — Z7951 Long term (current) use of inhaled steroids: Secondary | ICD-10-CM | POA: Insufficient documentation

## 2017-02-09 NOTE — Progress Notes (Signed)
Kim [name] 61 y.o. female Pulmonary Rehab Orientation Note Patient arrived today in Cardiac and Pulmonary Rehab for orientation to Pulmonary Rehab. She was transported from [employer] via wheel chair. She does not carry portable oxygen. Per pt, she uses oxygen at night when going to sleep. Color good, skin warm and dry. Patient is oriented to time and place. Patient's medical history, psychosocial health, and medications reviewed. Psychosocial assessment reveals pt lives with their spouse. 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 Pt hobbies include fishing. Pt reports her stress level is low. Areas of stress/anxiety include Health.  Pt does exhibits signs of depression. Signs of depression include anxiety, hopelessness and sadness and no sleep disturbances. PHQ2/9 score 0/9. Pt shows good  coping skills with positive outlook .  offered emotional support and reassurance. She is currently being treated for depression and is on paxil with positive results.  She feels her depression is manageable at this time. Will continue to monitor and evaluate progress toward psychosocial goal(s) of feeling stronger by participating in pulmonary rehab so as to be able to be more active. Physical assessment reveals heart rate is normal, breath sounds clear to auscultation, no wheezes, rales, or rhonchi. Grip strength equal, strong. Distal pulses 2+ bilateral posterior tibial pulses present. Patient reports she does take medications as prescribed. Patient states she follows a Low Sodium diet. The patient reports no specific efforts to gain or lose weight.. Patient's weight will be monitored closely. Demonstration and practice of PLB using pulse oximeter. Patient able to return demonstration satisfactorily. Safety and hand hygiene in the exercise area reviewed with patient. Patient voices understanding of the information reviewed. Department expectations discussed with patient and  achievable goals were set. The patient shows enthusiasm about attending the program and we look forward to working with this nice lady. The patient is scheduled for a 6 min walk test on Tuesday, February 10, 2017  and to begin exercise on Tuesday, February 17, 2017 in the 1030 am class.   [id number]

## 2017-02-09 NOTE — Addendum Note (Signed)
Encounter addended by: Lance Morin, RN on: 02/09/2017 2:01 PM  Actions taken: Visit Navigator Flowsheet section accepted

## 2017-02-10 ENCOUNTER — Encounter (HOSPITAL_COMMUNITY)
Admission: RE | Admit: 2017-02-10 | Discharge: 2017-02-10 | Disposition: A | Discharge status: Home / Self Care | Payer: 59 | Source: Ambulatory Visit | Attending: Pulmonary Disease | Admitting: Pulmonary Disease

## 2017-02-10 ENCOUNTER — Telehealth: Payer: Self-pay | Admitting: Internal Medicine

## 2017-02-10 DIAGNOSIS — Z7951 Long term (current) use of inhaled steroids: Secondary | ICD-10-CM | POA: Diagnosis not present

## 2017-02-10 DIAGNOSIS — Z79899 Other long term (current) drug therapy: Secondary | ICD-10-CM | POA: Diagnosis not present

## 2017-02-10 DIAGNOSIS — J441 Chronic obstructive pulmonary disease with (acute) exacerbation: Secondary | ICD-10-CM

## 2017-02-10 DIAGNOSIS — E785 Hyperlipidemia, unspecified: Secondary | ICD-10-CM | POA: Diagnosis not present

## 2017-02-10 DIAGNOSIS — F329 Major depressive disorder, single episode, unspecified: Secondary | ICD-10-CM | POA: Diagnosis not present

## 2017-02-10 DIAGNOSIS — Z87891 Personal history of nicotine dependence: Secondary | ICD-10-CM | POA: Diagnosis not present

## 2017-02-10 DIAGNOSIS — Z7982 Long term (current) use of aspirin: Secondary | ICD-10-CM | POA: Diagnosis not present

## 2017-02-10 NOTE — Telephone Encounter (Signed)
Pt request refill  HYDROcodone-acetaminophen (NORCO) 7.5-325 MG tablet   ALSO  ALPRAZolam (XANAX) 0.5 MG tablet  [id number] - [place], [place] - [address] AT [place]

## 2017-02-10 NOTE — Telephone Encounter (Signed)
Okay #90 but will need pain management visit for additional refills

## 2017-02-10 NOTE — Telephone Encounter (Signed)
HYDROcodone-acetaminophen (NORCO) 7.5-325 MG #90 tablet was refilled on10/05/2016.    Did pt not get 3 rx for a 65month supply?  Ok to refill?

## 2017-02-11 ENCOUNTER — Other Ambulatory Visit: Payer: Self-pay | Admitting: *Deleted

## 2017-02-11 ENCOUNTER — Encounter: Payer: Self-pay | Admitting: Cardiology

## 2017-02-11 ENCOUNTER — Ambulatory Visit (INDEPENDENT_AMBULATORY_CARE_PROVIDER_SITE_OTHER): Payer: 59 | Admitting: Cardiology

## 2017-02-11 ENCOUNTER — Encounter (HOSPITAL_COMMUNITY): Payer: Self-pay | Admitting: *Deleted

## 2017-02-11 VITALS — BP 122/80 | HR 93 | Ht 63.0 in | Wt 125.1 lb

## 2017-02-11 DIAGNOSIS — R0989 Other specified symptoms and signs involving the circulatory and respiratory systems: Secondary | ICD-10-CM | POA: Diagnosis not present

## 2017-02-11 MED ORDER — FUROSEMIDE 40 MG PO TABS: 40.0000 mg | ORAL_TABLET | Freq: Every day | ORAL | 6 refills | Status: DC | PRN

## 2017-02-11 MED ORDER — METOPROLOL TARTRATE 25 MG PO TABS: 12.5000 mg | ORAL_TABLET | Freq: Two times a day (BID) | ORAL | 0 refills | Status: DC

## 2017-02-11 MED ORDER — METOPROLOL TARTRATE 25 MG PO TABS: 12.5000 mg | ORAL_TABLET | Freq: Two times a day (BID) | ORAL | 3 refills | Status: DC

## 2017-02-11 NOTE — Addendum Note (Signed)
Addended by: Bobby Rumpf C on: 02/11/2017 11:07 AM   Modules accepted: Orders

## 2017-02-11 NOTE — Progress Notes (Signed)
02/11/2017 [name]   02/11/1956  [id number]  Primary Physician Marletta Lor, MD Primary Cardiologist: Dr. Angelena Form   Reason for Visit/CC: Labile BP  HPI:  Kim [name] is a 61 y.o. female followed by Dr. Angelena Form who presents to clinic today for management of her HTN. Pt has recently had fluctuating BPs.   Her PMH is notable for tobacco abuse and COPD. She is followed by pulmonology and requires supplemental O2 with activity and at night. She also has a prior h/o NICM. 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 She was admitted March 2017 for acute respiratory distress and acute CHF. Cardiac cath March 2017 showed minimal CAD and severe LV systolic dysfunction at 84-16%. F/u echo in June 2017 showed improved systolic function with normal LVEF at 60-65%. Pt also wore a cardiac monitor in 2017 that showed NSR with some PACs and PVCs.   Pt is here today due to recent labile BPs. She has had recent hypotension with SBPs in the 60s, 70s and 80s. She has felt lightheaded, but no syncope/ near syncope. She has also had elevated BPs in the 150s-170s. She reports she has been staying well hydrated. No n/v/d. No melena or hematochezia.   Her BP today is well controlled at 122/80. For the past 2 days, she has stayed off of her Coreg 3.125 mg BID as well as lisinopril, 2.5 mg BID. She only takes Lasix PRN but has not needed to use it in several months.   From a pulmonary standpoint, she notes frequent wheezing. She was readmitted to the hospital last month for acute COPD exacerbation.   Current Meds  Medication Sig  . albuterol (PROVENTIL) (2.5 MG/3ML) 0.083% nebulizer solution Take 3 mLs (2.5 mg total) by nebulization every 6 (six) hours as needed for wheezing or shortness of breath. DX: J44.1  . aspirin EC 81 MG EC tablet Take 1 tablet (81 mg total) by mouth daily.  Marland Kitchen ipratropium-albuterol (DUONEB) 0.5-2.5 (3) MG/3ML SOLN Take 3 mLs by nebulization every  4 (four) hours as needed (cough).  Marland Kitchen PAXIL CR 25 MG 24 hr tablet TAKE 1 TABLET BY MOUTH  EVERY MORNING  . Spacer/Aero Chamber Mouthpiece MISC 1 Device by Does not apply route as directed.  . SYMBICORT 160-4.5 MCG/ACT inhaler inhale 2 puffs by mouth twice a day  . Tiotropium Bromide Monohydrate (SPIRIVA RESPIMAT) 2.5 MCG/ACT AERS Inhale 2 puffs into the lungs daily. (Patient taking differently: Inhale 1 puff into the lungs daily. )  . VENTOLIN HFA 108 (90 Base) MCG/ACT inhaler INHALE 2 PUFFS INTO THE LUNGS EVERY 4 HOURS AS NEEDED FOR WHEEZING OR SHORTNESS OF BREATH   Allergies  Allergen Reactions  . Penicillins Hives    Has patient had a PCN reaction causing immediate rash, facial/tongue/throat swelling, SOB or lightheadedness with hypotension: Yes Has patient had a PCN reaction causing severe rash involving mucus membranes or skin necrosis: No Has patient had a PCN reaction that required hospitalization No Has patient had a PCN reaction occurring within the last 10 years: No If all of the above answers are "NO", then may proceed with Cephalosporin use.    Past Medical History:  Diagnosis Date  . C8 RADICULOPATHY 06/05/2009  . COPD 08/31/2007  . DEPRESSION 11/19/2006  . DYSLIPIDEMIA 03/12/2009  . MI (mitral incompetence)   . 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 OSTEOARTHRITIS 06/05/2009  . PARESTHESIA 08/24/2007  . TOBACCO ABUSE 01/25/2010   Family History  Problem Relation Age  of Onset  . Cancer Mother        lung  . Heart attack Mother   . Hypertension Sister   . Multiple sclerosis Brother   . Diabetes Sister   . Rheum arthritis Sister   . Thyroid disease Sister   . Multiple sclerosis Other   . Alcohol abuse Other   . Arthritis Other   . Diabetes Other   . Kidney disease Other   . Cancer Other        lung,ovarian,skin, uterine  . Stroke Other   . Heart disease Other   . Melanoma Other   . Heart attack Maternal Uncle   . Heart attack Other        NEICE  . Heart attack Other         NEPHEW  . Hypertension Brother   . Stroke Maternal Grandmother   . Colon cancer Neg Hx    Past Surgical History:  Procedure Laterality Date  . APPENDECTOMY  1969  . CHOLECYSTECTOMY  1989  . collapse lung  1990  . EXPLORATORY LAPAROTOMY  1969   Social History   Socioeconomic History  . Marital status: Married    Spouse name: Not on file  . Number of children: 2  . Years of education: Not on file  . Highest education level: Not on file  Social Needs  . Financial resource strain: Not hard at all  . Food insecurity - worry: Never true  . Food insecurity - inability: Never true  . Transportation needs - medical: No  . Transportation needs - non-medical: No  Occupational History  . Occupation: Patient care aide-sits with elderly lady  Tobacco Use  . Smoking status: Former Smoker    Packs/day: 2.00    Years: 38.00    Pack years: 76.00    Types: Cigarettes    Last attempt to quit: 03/07/2014    Years since quitting: 2.9  . Smokeless tobacco: Never Used  Substance and Sexual Activity  . Alcohol use: No    Alcohol/week: 0.0 oz  . Drug use: No  . Sexual activity: Not on file  Other Topics Concern  . Not on file  Social History Narrative   [facility] (01/12/17):   Originally from [facility]. Has lived in [place] for 17 years. Married. Has 2 dogs currently. No mold and only 1 indoor plant. Had her home professionally cleaned a month ago. No bird or hot tub exposure. 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      Review of Systems: General: negative for chills, fever, night sweats or weight changes.  Cardiovascular: negative for chest pain, dyspnea on exertion, edema, orthopnea, palpitations, paroxysmal nocturnal dyspnea or shortness of breath Dermatological: negative for rash Respiratory: negative for cough or wheezing Urologic: negative for hematuria Abdominal: negative for nausea, vomiting, diarrhea, bright red blood per rectum, melena, or  hematemesis Neurologic: negative for visual changes, syncope, or dizziness All other systems reviewed and are otherwise negative except as noted above.   Physical Exam:  Blood pressure 122/80, pulse 93, height 5\' 3"  (1.6 m), weight 125 lb 1.9 oz (56.8 kg), SpO2 97 %.  General appearance: alert, cooperative and no distress Neck: no carotid bruit and no JVD Lungs: clear to auscultation bilaterally Heart: regular rate and rhythm, S1, S2 normal, no murmur, click, rub or gallop Extremities: extremities normal, atraumatic, no cyanosis or edema Pulses: 2+ and symmetric Skin: Skin color, texture, turgor normal. No rashes or lesions Neurologic:  Grossly normal  EKG not performed -- personally reviewed   ASSESSMENT AND PLAN:   1. Labile BP: systolic BPs have ranged from the 60s-170s over the last several days. Pt has been asymptomatic. BP today in clinic is 122/80, but pt self discontinued lisinopril and coreg 2 days ago. Since then, SBPs have been in the 150s-170s at home. ? Accuracy of home BP cuff. Pt was initially started on Coreg and lisinopril in 2017 when she was diagnosed with NICM, which has since improved. Last echo showed normal LVEF. Her resting HR has averaged in the 90s-low 100s. Given her COPD, wheezing and frequent hospital readmissions for COPDE, we will stop Coreg and add cardioselective metoprolol, 12.5 mg BID. We will hold off on lisinopril for now. We will set pt up with 24 hr ambulatory BP monitor to more accurately assess BP trend. If needed, we can further titrate dose of metoprolol and/or restart lisinopril. Pt is in agreement with plan. F/u after monitor.   2. H/o NICM: ? Stress induced/ Takotsubo's cardiomyopathy. Diagnosed in 2017 when pt was admitted for acute COPDE and acute CHF. EF was 20-25%. LHC with minimal CAD. EF improved back to normal range after medical therapy with BB and ACE-I. F/u echo showed EF at 60-65%. No cardiac symptoms at present. Euvolemic on exam. Meds  being adjusted as outlined above.   3. COPD: frequent wheezing and frequent readmissions for acute COPDE. Last admitted 01/2017. We will change BB from Coreg to cardioselective metoprolol to reduce wheezing/ flare-ups.    Addylin Manke Ladoris Gene, MHS [facility] 02/11/2017 10:50 AM

## 2017-02-11 NOTE — Patient Instructions (Signed)
Medication Instructions: Your physician has recommended you make the following change in your medication:  -1) STOP Carvedilol -2) START Metoprolol Tartrate (Lopressor) 12.5 mg - Take 0.5 tablet (12,5 mg) by mouth twice daily -  [facility], Long term sent to [facility] -3) HOLD Lisinopril until further notice. We will contact you when you are ok to resume  Labwork: None Ordered  Procedures/Testing: Your physician has recommended that you wear a 24 hour blood pressure monitor. This monitor is a medical devices that records your blood pressure for a period of time. Doctors most often use these monitors to diagnose blood pressure disorders.   Follow-Up: Your physician recommends that you schedule a follow-up appointment based on your test results from Blood Pressure Monitor  If you need a refill on your cardiac medications before your next appointment, please call your pharmacy.   Your physician has requested that you regularly monitor and record your blood pressure readings at home. Please use the same machine at the same time of day to check your readings and record them to bring to your follow-up visit.

## 2017-02-12 NOTE — Progress Notes (Signed)
Pulmonary Individual Treatment Plan  Patient Details  Name: Kim [name] MRN: [id number] Date of Birth: 08-24-1955 Referring Provider:     Pulmonary Rehab Walk Test from 02/10/2017 in [place]  Referring Provider  Dr. Ashok Cordia      Initial Encounter Date:    Pulmonary Rehab Walk Test from 02/10/2017 in [place]  Date  02/10/17  Referring Provider  Dr. Ashok Cordia      Visit Diagnosis: Chronic obstructive pulmonary disease with (acute) exacerbation ([place])  Patient's Home Medications on Admission:   Current Outpatient Medications:  .  albuterol (PROVENTIL) (2.5 MG/3ML) 0.083% nebulizer solution, Take 3 mLs (2.5 mg total) by nebulization every 6 (six) hours as needed for wheezing or shortness of breath. DX: J44.1, Disp: 75 mL, Rfl: 3 .  ALPRAZolam (XANAX) 0.5 MG tablet, TAKE 1/2 TO 1 TABLET BY MOUTH 3 TIMES A DAY (Patient not taking: Reported on 02/11/2017), Disp: 90 tablet, Rfl: 0 .  aspirin EC 81 MG EC tablet, Take 1 tablet (81 mg total) by mouth daily., Disp: , Rfl:  .  benzonatate (TESSALON) 100 MG capsule, Take 1 capsule (100 mg total) by mouth 3 (three) times daily as needed for cough. (Patient not taking: Reported on 02/11/2017), Disp: 60 capsule, Rfl: 0 .  Cholecalciferol (VITAMIN D3) 1000 units CAPS, Take 1 capsule (1,000 Units total) by mouth 2 (two) times daily. (Patient not taking: Reported on 02/11/2017), Disp: 60 capsule, Rfl: 6 .  furosemide (LASIX) 40 MG tablet, Take 1 tablet (40 mg total) daily as needed by mouth (for weight gain of 3 lbs)., Disp: 30 tablet, Rfl: 6 .  guaiFENesin (MUCINEX) 600 MG 12 hr tablet, Take 2 tablets (1,200 mg total) by mouth 2 (two) times daily as needed for to loosen phlegm. (Patient not taking: Reported on 02/11/2017), Disp: 30 tablet, Rfl: 0 .  HYDROcodone-acetaminophen (NORCO) 7.5-325 MG tablet, TK 1 T PO Q 4 H PRN FOR MODERATE PAIN (Patient not taking: Reported on 02/11/2017), Disp: 90  tablet, Rfl: 0 .  ipratropium-albuterol (DUONEB) 0.5-2.5 (3) MG/3ML SOLN, Take 3 mLs by nebulization every 4 (four) hours as needed (cough)., Disp: 120 mL, Rfl: 3 .  lisinopril (PRINIVIL,ZESTRIL) 2.5 MG tablet, Take 2.5 mg by mouth 2 (two) times daily., Disp: , Rfl: 1 .  metoprolol tartrate (LOPRESSOR) 25 MG tablet, Take 0.5 tablets (12.5 mg total) 2 (two) times daily by mouth., Disp: 30 tablet, Rfl: 0 .  nitroGLYCERIN (NITROSTAT) 0.4 MG SL tablet, Place 1 tablet (0.4 mg total) under the tongue every 5 (five) minutes as needed for chest pain. (Patient not taking: Reported on 02/11/2017), Disp: 25 tablet, Rfl: 3 .  PAXIL CR 25 MG 24 hr tablet, TAKE 1 TABLET BY MOUTH  EVERY MORNING, Disp: 90 tablet, Rfl: 1 .  pravastatin (PRAVACHOL) 20 MG tablet, Take 1 tablet (20 mg total) by mouth every evening. (Patient not taking: Reported on 02/11/2017), Disp: 30 tablet, Rfl: 11 .  roflumilast (DALIRESP) 500 MCG TABS tablet, Take 1 tablet (500 mcg total) by mouth daily. (Patient not taking: Reported on 02/11/2017), Disp: 1 tablet, Rfl: 0 .  Spacer/Aero Chamber Mouthpiece MISC, 1 Device by Does not apply route as directed., Disp: 1 each, Rfl: 0 .  SYMBICORT 160-4.5 MCG/ACT inhaler, inhale 2 puffs by mouth twice a day, Disp: 10.2 Inhaler, Rfl: 5 .  Tiotropium Bromide Monohydrate (SPIRIVA RESPIMAT) 2.5 MCG/ACT AERS, Inhale 2 puffs into the lungs daily. (Patient taking differently: Inhale 1 puff into  the lungs daily. ), Disp: 1 Inhaler, Rfl: 0 .  VENTOLIN HFA 108 (90 Base) MCG/ACT inhaler, INHALE 2 PUFFS INTO THE LUNGS EVERY 4 HOURS AS NEEDED FOR WHEEZING OR SHORTNESS OF BREATH, Disp: 18 Inhaler, Rfl: 2 .  vitamin B-12 (CYANOCOBALAMIN) 500 MCG tablet, Take 500 mcg by mouth 2 (two) times daily., Disp: , Rfl:   Past Medical History: Past Medical History:  Diagnosis Date  . C8 RADICULOPATHY 06/05/2009  . COPD 08/31/2007  . DEPRESSION 11/19/2006  . DYSLIPIDEMIA 03/12/2009  . MI (mitral incompetence)   . 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 OSTEOARTHRITIS 06/05/2009  . PARESTHESIA 08/24/2007  . TOBACCO ABUSE 01/25/2010    Tobacco Use: Social History   Tobacco Use  Smoking Status Former Smoker  . Packs/day: 2.00  . Years: 38.00  . Pack years: 76.00  . Types: Cigarettes  . Last attempt to quit: 03/07/2014  . Years since quitting: 2.9  Smokeless Tobacco Never Used    Labs: Recent Review Flowsheet Data    Labs for ITP Cardiac and Pulmonary Rehab Latest Ref Rng & Units 07/06/2015 07/07/2015 03/07/2016 12/03/2016 01/14/2017   Cholestrol 0 - 200 mg/dL - 216(H) 182 - -   LDLCALC 0 - 99 mg/dL - 95 113(H) - -   LDLDIRECT mg/dL - - - - -   HDL >39.00 mg/dL - 79 38.70(L) - -   Trlycerides 0.0 - 149.0 mg/dL - 211(H) 151.0(H) - -   PHART 7.350 - 7.450 7.401 - - 7.317(L) -   PCO2ART 32.0 - 48.0 mmHg 36.6 - - 59.4(H) -   HCO3 20.0 - 28.0 mmol/L 22.7 - - 30.6(H) 32.9(H)   TCO2 22 - 32 mmol/L 24 - - 32 34(H)   ACIDBASEDEF 0.0 - 2.0 mmol/L 2.0 - - - -   O2SAT % 89.0 - - 100.0 75.0      Capillary Blood Glucose: Lab Results  Component Value Date   GLUCAP 83 12/05/2016   GLUCAP 117 (H) 07/08/2015   GLUCAP 85 07/08/2015   GLUCAP 107 (H) 07/07/2015   GLUCAP 98 07/07/2015     Pulmonary Assessment Scores: Pulmonary Assessment Scores    Row Name 02/11/17 1014 02/12/17 0722       ADL UCSD   ADL Phase  Entry  Entry    SOB Score total  67  -      CAT Score   CAT Score  29 Entry  -      mMRC Score   mMRC Score  -  3       Pulmonary Function Assessment: Pulmonary Function Assessment - 02/09/17 1020      Post Bronchodilator Spirometry Results   FEV1%  43 %    FEV1/FVC Ratio  48      Breath   Bilateral Breath Sounds  Clear    Shortness of Breath  Yes;Panic with Shortness of Breath;Limiting activity;Fear of Shortness of Breath       Exercise Target Goals: Date: 02/10/17  Exercise Program Goal: Individual exercise prescription set with THRR, safety & activity barriers. Participant demonstrates ability  to understand and report RPE using BORG scale, to self-measure pulse accurately, and to acknowledge the importance of the exercise prescription.  Exercise Prescription Goal: Starting with aerobic activity 30 plus minutes a day, 3 days per week for initial exercise prescription. Provide home exercise prescription and guidelines that participant acknowledges understanding prior to discharge.  Activity Barriers & Risk Stratification: Activity Barriers & Cardiac Risk Stratification - 02/09/17 1034  Activity Barriers & Cardiac Risk Stratification   Activity Barriers  Deconditioning;Shortness of Breath;Muscular Weakness       6 Minute Walk: 6 Minute Walk    Row Name 02/12/17 0719         6 Minute Walk   Phase  Initial     Distance  1300 feet     Walk Time  6 minutes     # of Rest Breaks  0     MPH  2.46     METS  2.91     RPE  12     Perceived Dyspnea   1     Symptoms  No     Resting HR  98 bpm     Resting BP  118/75     Resting Oxygen Saturation   93 %     Exercise Oxygen Saturation  during 6 min walk  85 %     Max Ex. HR  146 bpm     Max Ex. BP  148/80       Interval HR   1 Minute HR  105     2 Minute HR  105     3 Minute HR  106     4 Minute HR  107     5 Minute HR  146     6 Minute HR  146     2 Minute Post HR  104     Interval Heart Rate?  Yes       Interval Oxygen   Interval Oxygen?  Yes     Baseline Oxygen Saturation %  93 %     1 Minute Oxygen Saturation %  93 %     1 Minute Liters of Oxygen  0 L     2 Minute Oxygen Saturation %  92 %     2 Minute Liters of Oxygen  0 L     3 Minute Oxygen Saturation %  87 %     3 Minute Liters of Oxygen  0 L     4 Minute Oxygen Saturation %  86 %     4 Minute Liters of Oxygen  0 L     5 Minute Oxygen Saturation %  85 %     5 Minute Liters of Oxygen  0 L     6 Minute Oxygen Saturation %  85 %     6 Minute Liters of Oxygen  0 L     2 Minute Post Oxygen Saturation %  93 %     2 Minute Post Liters of Oxygen  0 L         Oxygen Initial Assessment: Oxygen Initial Assessment - 02/12/17 0721      Initial 6 min Walk   Oxygen Used  None      Program Oxygen Prescription   Program Oxygen Prescription  Continuous;E-Tanks    Liters per minute  2    Comments  Start at 2 liters and titrate from there since patient desaturated to 85% on their 6MWT       Oxygen Re-Evaluation:   Oxygen Discharge (Final Oxygen Re-Evaluation):   Initial Exercise Prescription: Initial Exercise Prescription - 02/12/17 0700      Date of Initial Exercise RX and Referring Provider   Date  02/10/17    Referring Provider  Dr. Ashok Cordia      Oxygen   Oxygen  Continuous    Liters  2  Bike   Level  0.4    Minutes  17      NuStep   Level  2    Minutes  17    METs  1.4      Track   Laps  10    Minutes  17      Prescription Details   Frequency (times per week)  2    Duration  Progress to 45 minutes of aerobic exercise without signs/symptoms of physical distress      Intensity   THRR 40-80% of Max Heartrate  63-126    Ratings of Perceived Exertion  11-13    Perceived Dyspnea  0-4      Progression   Progression  Continue progressive overload as per policy without signs/symptoms or physical distress.      Resistance Training   Training Prescription  Yes    Weight  orange bands    Reps  10-15       Perform Capillary Blood Glucose checks as needed.  Exercise Prescription Changes:   Exercise Comments:   Exercise Goals and Review:   Exercise Goals Re-Evaluation :   Discharge Exercise Prescription (Final Exercise Prescription Changes):   Nutrition:  Target Goals: Understanding of nutrition guidelines, daily intake of sodium 1500mg , cholesterol 200mg , calories 30% from fat and 7% or less from saturated fats, daily to have 5 or more servings of fruits and vegetables.  Biometrics: Pre Biometrics - 02/09/17 1035      Pre Biometrics   Grip Strength  25 kg        Nutrition Therapy Plan and  Nutrition Goals:   Nutrition Discharge: Rate Your Plate Scores:   Nutrition Goals Re-Evaluation:   Nutrition Goals Discharge (Final Nutrition Goals Re-Evaluation):   Psychosocial: Target Goals: Acknowledge presence or absence of significant depression and/or stress, maximize coping skills, provide positive support system. Participant is able to verbalize types and ability to use techniques and skills needed for reducing stress and depression.  Initial Review & Psychosocial Screening: Initial Psych Review & Screening - 02/09/17 1359      Initial Review   Current issues with  None Identified      Family Dynamics   Good Support System?  Yes      Barriers   Psychosocial barriers to participate in program  There are no identifiable barriers or psychosocial needs.      Screening Interventions   Interventions  Encouraged to exercise       Quality of Life Scores:   PHQ-9: Recent Review Flowsheet Data    Depression screen [facility] 2/9 02/09/2017   Decreased Interest 0   Down, Depressed, Hopeless 3   PHQ - 2 Score 3   Altered sleeping 0   Tired, decreased energy 1   Change in appetite 0   Feeling bad or failure about yourself  2   Trouble concentrating 0   Moving slowly or fidgety/restless 1   Suicidal thoughts 2   PHQ-9 Score 9   Difficult doing work/chores Not difficult at all     Interpretation of Total Score  Total Score Depression Severity:  1-4 = Minimal depression, 5-9 = Mild depression, 10-14 = Moderate depression, 15-19 = Moderately severe depression, 20-27 = Severe depression   Psychosocial Evaluation and Intervention: Psychosocial Evaluation - 02/09/17 1400      Psychosocial Evaluation & Interventions   Interventions  Encouraged to exercise with the program and follow exercise prescription    Continue Psychosocial Services   No Follow  up required       Psychosocial Re-Evaluation:   Psychosocial Discharge (Final Psychosocial  Re-Evaluation):   Education: Education Goals: Education classes will be provided on a weekly basis, covering required topics. Participant will state understanding/return demonstration of topics presented.  Learning Barriers/Preferences: Learning Barriers/Preferences - 02/09/17 1018      Learning Barriers/Preferences   Learning Barriers  None    Learning Preferences  Individual Instruction       Education Topics: Risk Factor Reduction:  -Group instruction that is supported by a PowerPoint presentation. Instructor discusses the definition of a risk factor, different risk factors for pulmonary disease, and how the heart and lungs work together.     Nutrition for Pulmonary Patient:  -Group instruction provided by PowerPoint slides, verbal discussion, and written materials to support subject matter. The instructor gives an explanation and review of healthy diet recommendations, which includes a discussion on weight management, recommendations for fruit and vegetable consumption, as well as protein, fluid, caffeine, fiber, sodium, sugar, and alcohol. Tips for eating when patients are short of breath are discussed.   Pursed Lip Breathing:  -Group instruction that is supported by demonstration and informational handouts. Instructor discusses the benefits of pursed lip and diaphragmatic breathing and detailed demonstration on how to preform both.     Oxygen Safety:  -Group instruction provided by PowerPoint, verbal discussion, and written material to support subject matter. There is an overview of "What is Oxygen" and "Why do we need it".  Instructor also reviews how to create a safe environment for oxygen use, the importance of using oxygen as prescribed, and the risks of noncompliance. There is a brief discussion on traveling with oxygen and resources the patient may utilize.   Oxygen Equipment:  -Group instruction provided by [facility] Staff utilizing handouts, written materials, and  equipment demonstrations.   Signs and Symptoms:  -Group instruction provided by written material and verbal discussion to support subject matter. Warning signs and symptoms of infection, stroke, and heart attack are reviewed and when to call the physician/911 reinforced. Tips for preventing the spread of infection discussed.   Advanced Directives:  -Group instruction provided by verbal instruction and written material to support subject matter. Instructor reviews Advanced Directive laws and proper instruction for filling out document.   Pulmonary Video:  -Group video education that reviews the importance of medication and oxygen compliance, exercise, good nutrition, pulmonary hygiene, and pursed lip and diaphragmatic breathing for the pulmonary patient.   Exercise for the Pulmonary Patient:  -Group instruction that is supported by a PowerPoint presentation. Instructor discusses benefits of exercise, core components of exercise, frequency, duration, and intensity of an exercise routine, importance of utilizing pulse oximetry during exercise, safety while exercising, and options of places to exercise outside of rehab.     Pulmonary Medications:  -Verbally interactive group education provided by instructor with focus on inhaled medications and proper administration.   Anatomy and Physiology of the Respiratory System and Intimacy:  -Group instruction provided by PowerPoint, verbal discussion, and written material to support subject matter. Instructor reviews respiratory cycle and anatomical components of the respiratory system and their functions. Instructor also reviews differences in obstructive and restrictive respiratory diseases with examples of each. Intimacy, Sex, and Sexuality differences are reviewed with a discussion on how relationships can change when diagnosed with pulmonary disease. Common sexual concerns are reviewed.   MD DAY -A group question and answer session with a medical  doctor that allows participants to ask questions that relate to  their pulmonary disease state.   OTHER EDUCATION -Group or individual verbal, written, or video instructions that support the educational goals of the pulmonary rehab program.   Knowledge Questionnaire Score: Knowledge Questionnaire Score - 02/11/17 1014      Knowledge Questionnaire Score   Pre Score  9/13       Core Components/Risk Factors/Patient Goals at Admission: Personal Goals and Risk Factors at Admission - 02/09/17 1400      Core Components/Risk Factors/Patient Goals on Admission   Improve shortness of breath with ADL's  Yes    Intervention  Provide education, individualized exercise plan and daily activity instruction to help decrease symptoms of SOB with activities of daily living.    Expected Outcomes  Short Term: Achieves a reduction of symptoms when performing activities of daily living.    Develop more efficient breathing techniques such as purse lipped breathing and diaphragmatic breathing; and practicing self-pacing with activity  Yes    Intervention  Provide education, demonstration and support about specific breathing techniuqes utilized for more efficient breathing. Include techniques such as pursed lipped breathing, diaphragmatic breathing and self-pacing activity.    Expected Outcomes  Short Term: Participant will be able to demonstrate and use breathing techniques as needed throughout daily activities.    Increase knowledge of respiratory medications and ability to use respiratory devices properly   Yes    Intervention  Provide education and demonstration as needed of appropriate use of medications, inhalers, and oxygen therapy.    Expected Outcomes  Short Term: Achieves understanding of medications use. Understands that oxygen is a medication prescribed by physician. Demonstrates appropriate use of inhaler and oxygen therapy.       Core Components/Risk Factors/Patient Goals Review:    Core  Components/Risk Factors/Patient Goals at Discharge (Final Review):    ITP Comments:   Comments:

## 2017-02-12 NOTE — Telephone Encounter (Signed)
Called pt to inform, pt made a pain management appointment. She will get prescription at that time.

## 2017-02-16 ENCOUNTER — Encounter: Payer: Self-pay | Admitting: Internal Medicine

## 2017-02-16 ENCOUNTER — Ambulatory Visit (INDEPENDENT_AMBULATORY_CARE_PROVIDER_SITE_OTHER): Payer: 59 | Admitting: Internal Medicine

## 2017-02-16 VITALS — BP 114/62 | HR 82 | Temp 98.6°F | Ht 63.0 in | Wt 124.8 lb

## 2017-02-16 DIAGNOSIS — G8929 Other chronic pain: Secondary | ICD-10-CM | POA: Diagnosis not present

## 2017-02-16 DIAGNOSIS — R300 Dysuria: Secondary | ICD-10-CM | POA: Diagnosis not present

## 2017-02-16 DIAGNOSIS — Z79899 Other long term (current) drug therapy: Secondary | ICD-10-CM

## 2017-02-16 LAB — POCT URINALYSIS DIPSTICK
Bilirubin, UA: NEGATIVE
Glucose, UA: NEGATIVE
Ketones, UA: NEGATIVE
LEUKOCYTES UA: NEGATIVE
NITRITE UA: NEGATIVE
PH UA: 6 (ref 5.0–8.0)
Spec Grav, UA: >=1.03 — AB (ref 1.010–1.025)
UROBILINOGEN UA: 0.2 U/dL

## 2017-02-16 MED ORDER — HYDROCODONE-ACETAMINOPHEN 7.5-325 MG PO TABS
ORAL_TABLET | ORAL | 0 refills | Status: DC
Start: 2017-02-16 — End: 2017-08-04

## 2017-02-16 MED ORDER — HYDROCODONE-ACETAMINOPHEN 7.5-325 MG PO TABS: ORAL_TABLET | ORAL | 0 refills | Status: DC

## 2017-02-16 NOTE — Progress Notes (Signed)
   Subjective:    Patient ID: Kim [name], female    DOB: 1955-08-18, 61 y.o.   MRN: [id number]  HPI 61 year old patient seen today for pain management per opioid protocol.  [place] printed, initialed  and scanned into the EMR.  Indication for chronic opioid: Patient has been on hydrocodone for chronic neck and low back pain.  She also takes this medications occasionally for dyspnea as well as chronic cough.  She does have a history of advanced COPD Medication and dose: Hydrocodone 5-acetaminophen 325 mg 1 every 8 hours as needed for pain.  Patient takes 0-3 tablets/day # pills per month: 90 pills/month max Last UDS date: February 16, 2017 Pain contract signed (Y/N): Yes Date narcotic database last reviewed (include red flags): February 16, 2017   Review of Systems     Objective:   Physical Exam        Assessment & Plan:   Encounter for chronic pain management (G89.29) Narcotic use  (711.90) Pain management contract signed (Z02.89)  [name]

## 2017-02-16 NOTE — Patient Instructions (Signed)
Return as scheduled for medical follow-up Follow-up cardiology and pulmonary as scheduled

## 2017-02-17 ENCOUNTER — Encounter (HOSPITAL_COMMUNITY)
Admission: RE | Admit: 2017-02-17 | Discharge: 2017-02-17 | Disposition: A | Discharge status: Home / Self Care | Payer: 59 | Source: Ambulatory Visit | Attending: Pulmonary Disease | Admitting: Pulmonary Disease

## 2017-02-17 VITALS — Wt 124.3 lb

## 2017-02-17 DIAGNOSIS — J441 Chronic obstructive pulmonary disease with (acute) exacerbation: Secondary | ICD-10-CM

## 2017-02-17 NOTE — Progress Notes (Signed)
Daily Session Note  Patient Details  Name: Kim [name] MRN: [id number] Date of Birth: Feb 19, 1956 Referring Provider:     Pulmonary Rehab Walk Test from 02/10/2017 in [place]  Referring Provider  Dr. Ashok Cordia      Encounter Date: 02/17/2017  Check In: Session Check In - 02/17/17 1235      Check-In   Location  [facility]    Staff Present  Rodney Langton, RN;Portia Rollene Rotunda, RN, BSN;Molly diVincenzo, MS, ACSM RCEP, Exercise Physiologist;Joan Leonia Reeves, RN, BSN    Supervising physician immediately available to respond to emergencies  [facility] immediately available    Physician(s)  Dr. Maylene Roes    Medication changes reported      No    Fall or balance concerns reported     No    Tobacco Cessation  No Change    Warm-up and Cool-down  Performed as group-led instruction    Resistance Training Performed  Yes    VAD Patient?  No      Pain Assessment   Currently in Pain?  No/denies    Multiple Pain Sites  No       Capillary Blood Glucose: No results found for this or any previous visit (from the past 24 hour(s)).  Exercise Prescription Changes - 02/17/17 1200      Response to Exercise   Blood Pressure (Admit)  102/64    Blood Pressure (Exercise)  130/70    Blood Pressure (Exit)  100/60    Heart Rate (Admit)  75 bpm    Heart Rate (Exercise)  92 bpm    Heart Rate (Exit)  82 bpm    Oxygen Saturation (Admit)  96 %    Oxygen Saturation (Exercise)  96 %    Oxygen Saturation (Exit)  95 %    Rating of Perceived Exertion (Exercise)  13    Perceived Dyspnea (Exercise)  1    Duration  Progress to 45 minutes of aerobic exercise without signs/symptoms of physical distress    Intensity  Other (comment) 40-80% of HRR   40-80% of HRR     Progression   Progression  Continue to progress workloads to maintain intensity without signs/symptoms of physical distress.      Resistance Training   Training Prescription  Yes    Weight  orange  bands    Reps  10-15    Time  10 Minutes      Oxygen   Oxygen  Continuous    Liters  2      Bike   Level  0.4    Minutes  17      NuStep   Level  2    Minutes  17    METs  1.9      Track   Laps  10    Minutes  17       Social History   Tobacco Use  Smoking Status Former Smoker  . Packs/day: 2.00  . Years: 38.00  . Pack years: 76.00  . Types: Cigarettes  . Last attempt to quit: 03/07/2014  . Years since quitting: 2.9  Smokeless Tobacco Never Used    Goals Met:  Exercise tolerated well No report of cardiac concerns or symptoms Strength training completed today  Goals Unmet:  Not Applicable  Comments: Service time is from 1030 to 1215    Dr. Rush Farmer is Medical Director for Pulmonary Rehab at [facility].

## 2017-02-19 ENCOUNTER — Encounter (HOSPITAL_COMMUNITY)
Admission: RE | Admit: 2017-02-19 | Discharge: 2017-02-19 | Disposition: A | Discharge status: Home / Self Care | Payer: 59 | Source: Ambulatory Visit | Attending: Pulmonary Disease | Admitting: Pulmonary Disease

## 2017-02-19 VITALS — Wt 126.3 lb

## 2017-02-19 DIAGNOSIS — J441 Chronic obstructive pulmonary disease with (acute) exacerbation: Secondary | ICD-10-CM

## 2017-02-19 NOTE — Progress Notes (Signed)
Pulmonary Individual Treatment Plan  Patient Details  Name: Kim [name] MRN: [id number] Date of Birth: 1955-09-29 Referring Provider:     Pulmonary Rehab Walk Test from 02/10/2017 in [place]  Referring Provider  Dr. Ashok Cordia      Initial Encounter Date:    Pulmonary Rehab Walk Test from 02/10/2017 in [place]  Date  02/10/17  Referring Provider  Dr. Ashok Cordia      Visit Diagnosis: No diagnosis found.  Patient's Home Medications on Admission:   Current Outpatient Medications:  .  albuterol (PROVENTIL) (2.5 MG/3ML) 0.083% nebulizer solution, Take 3 mLs (2.5 mg total) by nebulization every 6 (six) hours as needed for wheezing or shortness of breath. DX: J44.1, Disp: 75 mL, Rfl: 3 .  ALPRAZolam (XANAX) 0.5 MG tablet, TAKE 1/2 TO 1 TABLET BY MOUTH 3 TIMES A DAY, Disp: 90 tablet, Rfl: 0 .  aspirin EC 81 MG EC tablet, Take 1 tablet (81 mg total) by mouth daily., Disp: , Rfl:  .  Cholecalciferol (VITAMIN D3) 1000 units CAPS, Take 1 capsule (1,000 Units total) by mouth 2 (two) times daily., Disp: 60 capsule, Rfl: 6 .  furosemide (LASIX) 40 MG tablet, Take 1 tablet (40 mg total) daily as needed by mouth (for weight gain of 3 lbs)., Disp: 30 tablet, Rfl: 6 .  HYDROcodone-acetaminophen (NORCO) 7.5-325 MG tablet, TK 1 T PO Q 4 H PRN FOR MODERATE PAIN, Disp: 90 tablet, Rfl: 0 .  ipratropium-albuterol (DUONEB) 0.5-2.5 (3) MG/3ML SOLN, Take 3 mLs by nebulization every 4 (four) hours as needed (cough)., Disp: 120 mL, Rfl: 3 .  metoprolol tartrate (LOPRESSOR) 25 MG tablet, Take 0.5 tablets (12.5 mg total) 2 (two) times daily by mouth., Disp: 30 tablet, Rfl: 0 .  nitroGLYCERIN (NITROSTAT) 0.4 MG SL tablet, Place 1 tablet (0.4 mg total) under the tongue every 5 (five) minutes as needed for chest pain., Disp: 25 tablet, Rfl: 3 .  PAXIL CR 25 MG 24 hr tablet, TAKE 1 TABLET BY MOUTH  EVERY MORNING, Disp: 90 tablet, Rfl: 1 .  pravastatin  (PRAVACHOL) 20 MG tablet, Take 1 tablet (20 mg total) by mouth every evening., Disp: 30 tablet, Rfl: 11 .  Spacer/Aero Chamber Mouthpiece MISC, 1 Device by Does not apply route as directed., Disp: 1 each, Rfl: 0 .  SYMBICORT 160-4.5 MCG/ACT inhaler, inhale 2 puffs by mouth twice a day, Disp: 10.2 Inhaler, Rfl: 5 .  Tiotropium Bromide Monohydrate (SPIRIVA RESPIMAT) 2.5 MCG/ACT AERS, Inhale 2 puffs into the lungs daily. (Patient taking differently: Inhale 1 puff into the lungs daily. ), Disp: 1 Inhaler, Rfl: 0 .  VENTOLIN HFA 108 (90 Base) MCG/ACT inhaler, INHALE 2 PUFFS INTO THE LUNGS EVERY 4 HOURS AS NEEDED FOR WHEEZING OR SHORTNESS OF BREATH, Disp: 18 Inhaler, Rfl: 2 .  vitamin B-12 (CYANOCOBALAMIN) 500 MCG tablet, Take 500 mcg by mouth 2 (two) times daily., Disp: , Rfl:   Past Medical History: Past Medical History:  Diagnosis Date  . C8 RADICULOPATHY 06/05/2009  . COPD 08/31/2007  . DEPRESSION 11/19/2006  . DYSLIPIDEMIA 03/12/2009  . MI (mitral incompetence)   . 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 OSTEOARTHRITIS 06/05/2009  . PARESTHESIA 08/24/2007  . TOBACCO ABUSE 01/25/2010    Tobacco Use: Social History   Tobacco Use  Smoking Status Former Smoker  . Packs/day: 2.00  . Years: 38.00  . Pack years: 76.00  . Types: Cigarettes  . Last attempt to quit: 03/07/2014  . Years since  quitting: 2.9  Smokeless Tobacco Never Used    Labs: Recent Review Flowsheet Data    Labs for ITP Cardiac and Pulmonary Rehab Latest Ref Rng & Units 07/06/2015 07/07/2015 03/07/2016 12/03/2016 01/14/2017   Cholestrol 0 - 200 mg/dL - 216(H) 182 - -   LDLCALC 0 - 99 mg/dL - 95 113(H) - -   LDLDIRECT mg/dL - - - - -   HDL >39.00 mg/dL - 79 38.70(L) - -   Trlycerides 0.0 - 149.0 mg/dL - 211(H) 151.0(H) - -   PHART 7.350 - 7.450 7.401 - - 7.317(L) -   PCO2ART 32.0 - 48.0 mmHg 36.6 - - 59.4(H) -   HCO3 20.0 - 28.0 mmol/L 22.7 - - 30.6(H) 32.9(H)   TCO2 22 - 32 mmol/L 24 - - 32 34(H)   ACIDBASEDEF 0.0 - 2.0 mmol/L 2.0 - - -  -   O2SAT % 89.0 - - 100.0 75.0      Capillary Blood Glucose: Lab Results  Component Value Date   GLUCAP 83 12/05/2016   GLUCAP 117 (H) 07/08/2015   GLUCAP 85 07/08/2015   GLUCAP 107 (H) 07/07/2015   GLUCAP 98 07/07/2015     Pulmonary Assessment Scores: Pulmonary Assessment Scores    Row Name 02/11/17 1014 02/12/17 0722       ADL UCSD   ADL Phase  Entry  Entry    SOB Score total  67  -      CAT Score   CAT Score  29 Entry  -      mMRC Score   mMRC Score  -  3       Pulmonary Function Assessment: Pulmonary Function Assessment - 02/09/17 1020      Post Bronchodilator Spirometry Results   FEV1%  43 %    FEV1/FVC Ratio  48      Breath   Bilateral Breath Sounds  Clear    Shortness of Breath  Yes;Panic with Shortness of Breath;Limiting activity;Fear of Shortness of Breath       Exercise Target Goals:    Exercise Program Goal: Individual exercise prescription set with THRR, safety & activity barriers. Participant demonstrates ability to understand and report RPE using BORG scale, to self-measure pulse accurately, and to acknowledge the importance of the exercise prescription.  Exercise Prescription Goal: Starting with aerobic activity 30 plus minutes a day, 3 days per week for initial exercise prescription. Provide home exercise prescription and guidelines that participant acknowledges understanding prior to discharge.  Activity Barriers & Risk Stratification: Activity Barriers & Cardiac Risk Stratification - 02/09/17 1034      Activity Barriers & Cardiac Risk Stratification   Activity Barriers  Deconditioning;Shortness of Breath;Muscular Weakness       6 Minute Walk: 6 Minute Walk    Row Name 02/12/17 0719         6 Minute Walk   Phase  Initial     Distance  1300 feet     Walk Time  6 minutes     # of Rest Breaks  0     MPH  2.46     METS  2.91     RPE  12     Perceived Dyspnea   1     Symptoms  No     Resting HR  98 bpm     Resting BP  118/75      Resting Oxygen Saturation   93 %     Exercise Oxygen Saturation  during 6 min  walk  85 %     Max Ex. HR  146 bpm     Max Ex. BP  148/80       Interval HR   1 Minute HR  105     2 Minute HR  105     3 Minute HR  106     4 Minute HR  107     5 Minute HR  146     6 Minute HR  146     2 Minute Post HR  104     Interval Heart Rate?  Yes       Interval Oxygen   Interval Oxygen?  Yes     Baseline Oxygen Saturation %  93 %     1 Minute Oxygen Saturation %  93 %     1 Minute Liters of Oxygen  0 L     2 Minute Oxygen Saturation %  92 %     2 Minute Liters of Oxygen  0 L     3 Minute Oxygen Saturation %  87 %     3 Minute Liters of Oxygen  0 L     4 Minute Oxygen Saturation %  86 %     4 Minute Liters of Oxygen  0 L     5 Minute Oxygen Saturation %  85 %     5 Minute Liters of Oxygen  0 L     6 Minute Oxygen Saturation %  85 %     6 Minute Liters of Oxygen  0 L     2 Minute Post Oxygen Saturation %  93 %     2 Minute Post Liters of Oxygen  0 L        Oxygen Initial Assessment: Oxygen Initial Assessment - 02/12/17 0721      Initial 6 min Walk   Oxygen Used  None      Program Oxygen Prescription   Program Oxygen Prescription  Continuous;E-Tanks    Liters per minute  2    Comments  Start at 2 liters and titrate from there since patient desaturated to 85% on their 6MWT       Oxygen Re-Evaluation: Oxygen Re-Evaluation    Row Name 02/17/17 1604             Program Oxygen Prescription   Program Oxygen Prescription  Continuous;E-Tanks       Liters per minute  2       Comments  Start at 2 liters and titrate from there since patient desaturated to 85% on their 6MWT         Home Oxygen   Home Oxygen Device  Portable Concentrator;Home Concentrator       Sleep Oxygen Prescription  Continuous       Liters per minute  2       Home Exercise Oxygen Prescription  Continuous       Liters per minute  2       Home at Rest Exercise Oxygen Prescription  None          Goals/Expected Outcomes   Short Term Goals  To learn and exhibit compliance with exercise, home and travel O2 prescription       Long  Term Goals  Exhibits compliance with exercise, home and travel O2 prescription          Oxygen Discharge (Final Oxygen Re-Evaluation): Oxygen Re-Evaluation - 02/17/17 1604      Program Oxygen Prescription  Program Oxygen Prescription  Continuous;E-Tanks    Liters per minute  2    Comments  Start at 2 liters and titrate from there since patient desaturated to 85% on their 6MWT      Home Oxygen   Home Oxygen Device  Portable Concentrator;Home Concentrator    Sleep Oxygen Prescription  Continuous    Liters per minute  2    Home Exercise Oxygen Prescription  Continuous    Liters per minute  2    Home at Rest Exercise Oxygen Prescription  None      Goals/Expected Outcomes   Short Term Goals  To learn and exhibit compliance with exercise, home and travel O2 prescription    Long  Term Goals  Exhibits compliance with exercise, home and travel O2 prescription       Initial Exercise Prescription: Initial Exercise Prescription - 02/12/17 0700      Date of Initial Exercise RX and Referring Provider   Date  02/10/17    Referring Provider  Dr. Ashok Cordia      Oxygen   Oxygen  Continuous    Liters  2      Bike   Level  0.4    Minutes  17      NuStep   Level  2    Minutes  17    METs  1.4      Track   Laps  10    Minutes  17      Prescription Details   Frequency (times per week)  2    Duration  Progress to 45 minutes of aerobic exercise without signs/symptoms of physical distress      Intensity   THRR 40-80% of Max Heartrate  63-126    Ratings of Perceived Exertion  11-13    Perceived Dyspnea  0-4      Progression   Progression  Continue progressive overload as per policy without signs/symptoms or physical distress.      Resistance Training   Training Prescription  Yes    Weight  orange bands    Reps  10-15       Perform Capillary  Blood Glucose checks as needed.  Exercise Prescription Changes: Exercise Prescription Changes    Row Name 02/17/17 1200             Response to Exercise   Blood Pressure (Admit)  102/64       Blood Pressure (Exercise)  130/70       Blood Pressure (Exit)  100/60       Heart Rate (Admit)  75 bpm       Heart Rate (Exercise)  92 bpm       Heart Rate (Exit)  82 bpm       Oxygen Saturation (Admit)  96 %       Oxygen Saturation (Exercise)  96 %       Oxygen Saturation (Exit)  95 %       Rating of Perceived Exertion (Exercise)  13       Perceived Dyspnea (Exercise)  1       Duration  Progress to 45 minutes of aerobic exercise without signs/symptoms of physical distress       Intensity  Other (comment) 40-80% of HRR         Progression   Progression  Continue to progress workloads to maintain intensity without signs/symptoms of physical distress.         Horticulturist, commercial  Prescription  Yes       Weight  orange bands       Reps  10-15       Time  10 Minutes         Oxygen   Oxygen  Continuous       Liters  2         Bike   Level  0.4       Minutes  17         NuStep   Level  2       Minutes  17       METs  1.9         Track   Laps  10       Minutes  17          Exercise Comments:   Exercise Goals and Review:   Exercise Goals Re-Evaluation : Exercise Goals Re-Evaluation    Row Name 02/19/17 1005             Exercise Goal Re-Evaluation   Exercise Goals Review  Increase Strength and Stamina;Increase Physical Activity;Able to understand and use Dyspnea scale;Able to understand and use rate of perceived exertion (RPE) scale;Knowledge and understanding of Target Heart Rate Range (THRR);Understanding of Exercise Prescription       Comments  Patient has only attended one rehab session. Will cont. to monitor and progress as able.        Expected Outcomes  Through exercise at rehab and at home, patient will increase strength and stamina and also be able  to perform ADL's easier.          Discharge Exercise Prescription (Final Exercise Prescription Changes): Exercise Prescription Changes - 02/17/17 1200      Response to Exercise   Blood Pressure (Admit)  102/64    Blood Pressure (Exercise)  130/70    Blood Pressure (Exit)  100/60    Heart Rate (Admit)  75 bpm    Heart Rate (Exercise)  92 bpm    Heart Rate (Exit)  82 bpm    Oxygen Saturation (Admit)  96 %    Oxygen Saturation (Exercise)  96 %    Oxygen Saturation (Exit)  95 %    Rating of Perceived Exertion (Exercise)  13    Perceived Dyspnea (Exercise)  1    Duration  Progress to 45 minutes of aerobic exercise without signs/symptoms of physical distress    Intensity  Other (comment) 40-80% of HRR      Progression   Progression  Continue to progress workloads to maintain intensity without signs/symptoms of physical distress.      Resistance Training   Training Prescription  Yes    Weight  orange bands    Reps  10-15    Time  10 Minutes      Oxygen   Oxygen  Continuous    Liters  2      Bike   Level  0.4    Minutes  17      NuStep   Level  2    Minutes  17    METs  1.9      Track   Laps  10    Minutes  17       Nutrition:  Target Goals: Understanding of nutrition guidelines, daily intake of sodium <1585m, cholesterol <2087m calories 30% from fat and 7% or less from saturated fats, daily to have 5 or more servings of fruits and vegetables.  Biometrics: Pre Biometrics - 02/09/17 1035      Pre Biometrics   Grip Strength  25 kg        Nutrition Therapy Plan and Nutrition Goals:   Nutrition Discharge: Rate Your Plate Scores:   Nutrition Goals Re-Evaluation:   Nutrition Goals Discharge (Final Nutrition Goals Re-Evaluation):   Psychosocial: Target Goals: Acknowledge presence or absence of significant depression and/or stress, maximize coping skills, provide positive support system. Participant is able to verbalize types and ability to use techniques  and skills needed for reducing stress and depression.  Initial Review & Psychosocial Screening: Initial Psych Review & Screening - 02/09/17 1359      Initial Review   Current issues with  None Identified      Family Dynamics   Good Support System?  Yes      Barriers   Psychosocial barriers to participate in program  There are no identifiable barriers or psychosocial needs.      Screening Interventions   Interventions  Encouraged to exercise       Quality of Life Scores:   PHQ-9: Recent Review Flowsheet Data    Depression screen [facility] 2/9 02/09/2017   Decreased Interest 0   Down, Depressed, Hopeless 3   PHQ - 2 Score 3   Altered sleeping 0   Tired, decreased energy 1   Change in appetite 0   Feeling bad or failure about yourself  2   Trouble concentrating 0   Moving slowly or fidgety/restless 1   Suicidal thoughts 2   PHQ-9 Score 9   Difficult doing work/chores Not difficult at all     Interpretation of Total Score  Total Score Depression Severity:  1-4 = Minimal depression, 5-9 = Mild depression, 10-14 = Moderate depression, 15-19 = Moderately severe depression, 20-27 = Severe depression   Psychosocial Evaluation and Intervention: Psychosocial Evaluation - 02/09/17 1400      Psychosocial Evaluation & Interventions   Interventions  Encouraged to exercise with the program and follow exercise prescription    Continue Psychosocial Services   No Follow up required       Psychosocial Re-Evaluation: Psychosocial Re-Evaluation    [place] Name 02/17/17 1608             Psychosocial Re-Evaluation   Current issues with  None Identified       Expected Outcomes  patient will remain free from psychosocial barriers to participation in pulmonary rehab       Interventions  Encouraged to attend Cardiac Rehabilitation for the exercise       Continue Psychosocial Services   No Follow up required          Psychosocial Discharge (Final Psychosocial Re-Evaluation): Psychosocial  Re-Evaluation - 02/17/17 1608      Psychosocial Re-Evaluation   Current issues with  None Identified    Expected Outcomes  patient will remain free from psychosocial barriers to participation in pulmonary rehab    Interventions  Encouraged to attend Cardiac Rehabilitation for the exercise    Continue Psychosocial Services   No Follow up required       Education: Education Goals: Education classes will be provided on a weekly basis, covering required topics. Participant will state understanding/return demonstration of topics presented.  Learning Barriers/Preferences: Learning Barriers/Preferences - 02/09/17 1018      Learning Barriers/Preferences   Learning Barriers  None    Learning Preferences  Individual Instruction       Education Topics: Risk Factor Reduction:  -Group instruction that  is supported by a PowerPoint presentation. Instructor discusses the definition of a risk factor, different risk factors for pulmonary disease, and how the heart and lungs work together.     Nutrition for Pulmonary Patient:  -Group instruction provided by PowerPoint slides, verbal discussion, and written materials to support subject matter. The instructor gives an explanation and review of healthy diet recommendations, which includes a discussion on weight management, recommendations for fruit and vegetable consumption, as well as protein, fluid, caffeine, fiber, sodium, sugar, and alcohol. Tips for eating when patients are short of breath are discussed.   Pursed Lip Breathing:  -Group instruction that is supported by demonstration and informational handouts. Instructor discusses the benefits of pursed lip and diaphragmatic breathing and detailed demonstration on how to preform both.     Oxygen Safety:  -Group instruction provided by PowerPoint, verbal discussion, and written material to support subject matter. There is an overview of "What is Oxygen" and "Why do we need it".  Instructor also  reviews how to create a safe environment for oxygen use, the importance of using oxygen as prescribed, and the risks of noncompliance. There is a brief discussion on traveling with oxygen and resources the patient may utilize.   Oxygen Equipment:  -Group instruction provided by [facility] Staff utilizing handouts, written materials, and equipment demonstrations.   Signs and Symptoms:  -Group instruction provided by written material and verbal discussion to support subject matter. Warning signs and symptoms of infection, stroke, and heart attack are reviewed and when to call the physician/911 reinforced. Tips for preventing the spread of infection discussed.   Advanced Directives:  -Group instruction provided by verbal instruction and written material to support subject matter. Instructor reviews Advanced Directive laws and proper instruction for filling out document.   Pulmonary Video:  -Group video education that reviews the importance of medication and oxygen compliance, exercise, good nutrition, pulmonary hygiene, and pursed lip and diaphragmatic breathing for the pulmonary patient.   Exercise for the Pulmonary Patient:  -Group instruction that is supported by a PowerPoint presentation. Instructor discusses benefits of exercise, core components of exercise, frequency, duration, and intensity of an exercise routine, importance of utilizing pulse oximetry during exercise, safety while exercising, and options of places to exercise outside of rehab.     Pulmonary Medications:  -Verbally interactive group education provided by instructor with focus on inhaled medications and proper administration.   Anatomy and Physiology of the Respiratory System and Intimacy:  -Group instruction provided by PowerPoint, verbal discussion, and written material to support subject matter. Instructor reviews respiratory cycle and anatomical components of the respiratory system and their functions. Instructor  also reviews differences in obstructive and restrictive respiratory diseases with examples of each. Intimacy, Sex, and Sexuality differences are reviewed with a discussion on how relationships can change when diagnosed with pulmonary disease. Common sexual concerns are reviewed.   MD DAY -A group question and answer session with a medical doctor that allows participants to ask questions that relate to their pulmonary disease state.   OTHER EDUCATION -Group or individual verbal, written, or video instructions that support the educational goals of the pulmonary rehab program.   Knowledge Questionnaire Score: Knowledge Questionnaire Score - 02/11/17 1014      Knowledge Questionnaire Score   Pre Score  9/13       Core Components/Risk Factors/Patient Goals at Admission: Personal Goals and Risk Factors at Admission - 02/09/17 1400      Core Components/Risk Factors/Patient Goals on Admission   Improve  shortness of breath with ADL's  Yes    Intervention  Provide education, individualized exercise plan and daily activity instruction to help decrease symptoms of SOB with activities of daily living.    Expected Outcomes  Short Term: Achieves a reduction of symptoms when performing activities of daily living.    Develop more efficient breathing techniques such as purse lipped breathing and diaphragmatic breathing; and practicing self-pacing with activity  Yes    Intervention  Provide education, demonstration and support about specific breathing techniuqes utilized for more efficient breathing. Include techniques such as pursed lipped breathing, diaphragmatic breathing and self-pacing activity.    Expected Outcomes  Short Term: Participant will be able to demonstrate and use breathing techniques as needed throughout daily activities.    Increase knowledge of respiratory medications and ability to use respiratory devices properly   Yes    Intervention  Provide education and demonstration as needed of  appropriate use of medications, inhalers, and oxygen therapy.    Expected Outcomes  Short Term: Achieves understanding of medications use. Understands that oxygen is a medication prescribed by physician. Demonstrates appropriate use of inhaler and oxygen therapy.       Core Components/Risk Factors/Patient Goals Review:  Goals and Risk Factor Review    Row Name 02/17/17 1606             Core Components/Risk Factors/Patient Goals Review   Personal Goals Review  Improve shortness of breath with ADL's;Develop more efficient breathing techniques such as purse lipped breathing and diaphragmatic breathing and practicing self-pacing with activity.;Increase knowledge of respiratory medications and ability to use respiratory devices properly.       Review  no progress towards goals related to only attending 1 session since admission       Expected Outcomes  see admission outcomes          Core Components/Risk Factors/Patient Goals at Discharge (Final Review):  Goals and Risk Factor Review - 02/17/17 1606      Core Components/Risk Factors/Patient Goals Review   Personal Goals Review  Improve shortness of breath with ADL's;Develop more efficient breathing techniques such as purse lipped breathing and diaphragmatic breathing and practicing self-pacing with activity.;Increase knowledge of respiratory medications and ability to use respiratory devices properly.    Review  no progress towards goals related to only attending 1 session since admission    Expected Outcomes  see admission outcomes       ITP Comments:   Comments: ITP REVIEW Unable to evaluate patients progress toward pulmonary rehab goals after completing only 2 sessions. Recommend continued exercise, life style modification, education, and utilization of breathing techniques to increase stamina and strength and decrease shortness of breath with exertion.

## 2017-02-19 NOTE — Progress Notes (Signed)
Daily Session Note  Patient Details  Name: Kim [name] MRN: [id number] Date of Birth: 04/25/1955 Referring Provider:     Pulmonary Rehab Walk Test from 02/10/2017 in [facility]  Referring Provider  Dr. Nestor      Encounter Date: 02/19/2017  Check In: Session Check In - 02/19/17 1030      Check-In   Location  [facility]    Staff Present  Joan Behrens, RN, BSN;Molly diVincenzo, MS, ACSM RCEP, Exercise Physiologist; , RN, BSN;Lisa Hughes, RN    Supervising physician immediately available to respond to emergencies  [facility] immediately available    Physician(s)  Dr. Joseph    Medication changes reported      No    Fall or balance concerns reported     No    Tobacco Cessation  No Change    Warm-up and Cool-down  Performed as group-led instruction    Resistance Training Performed  Yes    VAD Patient?  No      Pain Assessment   Currently in Pain?  No/denies    Multiple Pain Sites  No       Capillary Blood Glucose: No results found for this or any previous visit (from the past 24 hour(s)).  Exercise Prescription Changes - 02/19/17 1237      Response to Exercise   Blood Pressure (Admit)  126/60    Blood Pressure (Exercise)  130/56    Blood Pressure (Exit)  130/70    Heart Rate (Admit)  72 bpm    Heart Rate (Exercise)  78 bpm    Heart Rate (Exit)  72 bpm    Oxygen Saturation (Admit)  94 %    Oxygen Saturation (Exercise)  97 %    Oxygen Saturation (Exit)  98 %    Rating of Perceived Exertion (Exercise)  13    Perceived Dyspnea (Exercise)  1    Duration  Progress to 45 minutes of aerobic exercise without signs/symptoms of physical distress    Intensity  THRR unchanged      Progression   Progression  Continue to progress workloads to maintain intensity without signs/symptoms of physical distress.      Resistance Training   Training Prescription  Yes    Weight  orange bands    Reps  10-15    Time   10 Minutes      Oxygen   Oxygen  Continuous    Liters  2      NuStep   Level  2    Minutes  17    METs  1.8      Track   Laps  12    Minutes  17       Social History   Tobacco Use  Smoking Status Former Smoker  . Packs/day: 2.00  . Years: 38.00  . Pack years: 76.00  . Types: Cigarettes  . Last attempt to quit: 03/07/2014  . Years since quitting: 2.9  Smokeless Tobacco Never Used    Goals Met:  Exercise tolerated well Queuing for purse lip breathing No report of cardiac concerns or symptoms Strength training completed today  Goals Unmet:  Not Applicable  Comments: Service time is from 1030 to 1225   Dr. Wesam G. Yacoub is Medical Director for Pulmonary Rehab at [facility]. 

## 2017-02-22 LAB — PAIN MGMT, PROFILE 8 W/CONF, U
6 ACETYLMORPHINE: NEGATIVE ng/mL (ref <10)
ALPHAHYDROXYALPRAZOLAM: 74 ng/mL — AB (ref <25)
AMPHETAMINES: NEGATIVE ng/mL (ref <500)
Alcohol Metabolites: NEGATIVE ng/mL (ref <500)
Alphahydroxymidazolam: NEGATIVE ng/mL (ref <50)
Alphahydroxytriazolam: NEGATIVE ng/mL (ref <50)
Aminoclonazepam: NEGATIVE ng/mL (ref <25)
BENZODIAZEPINES: POSITIVE ng/mL — AB (ref <100)
Buprenorphine, Urine: NEGATIVE ng/mL (ref <5)
COCAINE METABOLITE: NEGATIVE ng/mL (ref <150)
CREATININE: 267.4 mg/dL
Hydroxyethylflurazepam: NEGATIVE ng/mL (ref <50)
LORAZEPAM: NEGATIVE ng/mL (ref <50)
MDMA: NEGATIVE ng/mL (ref <500)
Marijuana Metabolite: NEGATIVE ng/mL (ref <20)
NORDIAZEPAM: NEGATIVE ng/mL (ref <50)
OXIDANT: NEGATIVE ug/mL (ref <200)
Opiates: NEGATIVE ng/mL (ref <100)
Oxazepam: NEGATIVE ng/mL (ref <50)
Oxycodone: NEGATIVE ng/mL (ref <100)
PH: 6.5 (ref 4.5–9.0)
Temazepam: NEGATIVE ng/mL (ref <50)

## 2017-02-24 ENCOUNTER — Other Ambulatory Visit: Payer: Self-pay | Admitting: Emergency Medicine

## 2017-02-24 ENCOUNTER — Encounter (HOSPITAL_COMMUNITY)
Admission: RE | Admit: 2017-02-24 | Discharge: 2017-02-24 | Disposition: A | Discharge status: Home / Self Care | Payer: 59 | Source: Ambulatory Visit | Attending: Pulmonary Disease | Admitting: Pulmonary Disease

## 2017-02-24 VITALS — Wt 125.7 lb

## 2017-02-24 DIAGNOSIS — J441 Chronic obstructive pulmonary disease with (acute) exacerbation: Secondary | ICD-10-CM | POA: Diagnosis not present

## 2017-02-24 NOTE — Progress Notes (Signed)
Daily Session Note  Patient Details  Name: Kim [name] MRN: [id number] Date of Birth: [birth year] Referring Provider:     Pulmonary Rehab Walk Test from 02/10/2017 in [place]  Referring Provider  Dr. Ashok Cordia      Encounter Date: 02/24/2017  Check In: Session Check In - 02/24/17 1017      Check-In   Location  [facility]    Staff Present  Rosebud Poles, RN, BSN;Molly diVincenzo, MS, ACSM RCEP, Exercise Physiologist;Maxson Oddo Rollene Rotunda, RN, Roque Cash, RN    Supervising physician immediately available to respond to emergencies  [facility] immediately available    Physician(s)  Dr. Maylene Roes    Medication changes reported      No    Fall or balance concerns reported     No    Tobacco Cessation  No Change    Warm-up and Cool-down  Performed as group-led instruction    Resistance Training Performed  Yes    VAD Patient?  No      Pain Assessment   Currently in Pain?  No/denies    Multiple Pain Sites  No       Capillary Blood Glucose: No results found for this or any previous visit (from the past 24 hour(s)).  Exercise Prescription Changes - 02/24/17 1223      Response to Exercise   Blood Pressure (Admit)  104/60    Blood Pressure (Exercise)  130/64    Blood Pressure (Exit)  108/60    Heart Rate (Admit)  65 bpm    Heart Rate (Exercise)  78 bpm    Heart Rate (Exit)  65 bpm    Oxygen Saturation (Admit)  98 %    Oxygen Saturation (Exercise)  97 %    Oxygen Saturation (Exit)  95 %    Rating of Perceived Exertion (Exercise)  13    Perceived Dyspnea (Exercise)  2    Duration  Progress to 45 minutes of aerobic exercise without signs/symptoms of physical distress    Intensity  THRR unchanged      Progression   Progression  Continue to progress workloads to maintain intensity without signs/symptoms of physical distress.      Resistance Training   Training Prescription  Yes    Weight  orange bands    Reps  10-15    Time   10 Minutes      Oxygen   Oxygen  Continuous    Liters  2      Bike   Level  0.4    Minutes  17      NuStep   Level  2    Minutes  17    METs  1.9      Track   Laps  14    Minutes  17       Social History   Tobacco Use  Smoking Status Former Smoker  . Packs/day: 2.00  . Years: 38.00  . Pack years: 76.00  . Types: Cigarettes  . Last attempt to quit: 03/07/2014  . Years since quitting: 2.9  Smokeless Tobacco Never Used    Goals Met:  Exercise tolerated well No report of cardiac concerns or symptoms Strength training completed today  Goals Unmet:  Not Applicable  Comments: Service time is from 1030 to 1210   Dr. Rush Farmer is Medical Director for Pulmonary Rehab at [facility].

## 2017-03-03 ENCOUNTER — Encounter (HOSPITAL_COMMUNITY): Payer: 59

## 2017-03-03 ENCOUNTER — Telehealth (HOSPITAL_COMMUNITY): Payer: Self-pay | Admitting: Internal Medicine

## 2017-03-05 ENCOUNTER — Encounter (HOSPITAL_COMMUNITY)
Admission: RE | Admit: 2017-03-05 | Discharge: 2017-03-05 | Disposition: A | Discharge status: Home / Self Care | Payer: 59 | Source: Ambulatory Visit | Attending: Pulmonary Disease | Admitting: Pulmonary Disease

## 2017-03-05 VITALS — Wt 127.0 lb

## 2017-03-05 DIAGNOSIS — J441 Chronic obstructive pulmonary disease with (acute) exacerbation: Secondary | ICD-10-CM

## 2017-03-05 NOTE — Progress Notes (Signed)
Daily Session Note  Patient Details  Name: Kim [name] MRN: [id number] Date of Birth: November 04, 1955 Referring Provider:     Pulmonary Rehab Walk Test from 02/10/2017 in [place]  Referring Provider  Dr. Ashok Cordia      Encounter Date: 03/05/2017  Check In: Session Check In - 03/05/17 1027      Check-In   Location  [facility]    Staff Present  Rosebud Poles, RN, BSN;Adylene Dlugosz, MS, ACSM RCEP, Exercise Physiologist;Portia Rollene Rotunda, RN, Roque Cash, RN    Supervising physician immediately available to respond to emergencies  [facility] immediately available    Physician(s)  Dr. Nevada Crane    Medication changes reported      No    Fall or balance concerns reported     No    Tobacco Cessation  No Change    Warm-up and Cool-down  Performed as group-led instruction    Resistance Training Performed  Yes    VAD Patient?  No      Pain Assessment   Currently in Pain?  No/denies    Multiple Pain Sites  No       Capillary Blood Glucose: No results found for this or any previous visit (from the past 24 hour(s)).  Exercise Prescription Changes - 03/05/17 1300      Response to Exercise   Blood Pressure (Admit)  116/50    Blood Pressure (Exercise)  122/72    Blood Pressure (Exit)  114/58    Heart Rate (Admit)  65 bpm    Heart Rate (Exercise)  78 bpm    Heart Rate (Exit)  66 bpm    Oxygen Saturation (Admit)  94 %    Oxygen Saturation (Exercise)  96 %    Oxygen Saturation (Exit)  97 %    Rating of Perceived Exertion (Exercise)  11    Perceived Dyspnea (Exercise)  0    Duration  Progress to 45 minutes of aerobic exercise without signs/symptoms of physical distress    Intensity  THRR unchanged      Progression   Progression  Continue to progress workloads to maintain intensity without signs/symptoms of physical distress.      Resistance Training   Training Prescription  Yes    Weight  orange bands    Reps  10-15    Time   10 Minutes      Oxygen   Oxygen  Continuous    Liters  2      Bike   Level  0.4    Minutes  17      Track   Laps  7    Minutes  17       Social History   Tobacco Use  Smoking Status Former Smoker  . Packs/day: 2.00  . Years: 38.00  . Pack years: 76.00  . Types: Cigarettes  . Last attempt to quit: 03/07/2014  . Years since quitting: 2.9  Smokeless Tobacco Never Used    Goals Met:  Exercise tolerated well No report of cardiac concerns or symptoms Strength training completed today  Goals Unmet:  Not Applicable  Comments: Service time is from 10:30a to 12:35p    Dr. Rush Farmer is Medical Director for Pulmonary Rehab at [facility].

## 2017-03-06 ENCOUNTER — Ambulatory Visit (INDEPENDENT_AMBULATORY_CARE_PROVIDER_SITE_OTHER): Payer: 59 | Admitting: Pulmonary Disease

## 2017-03-06 VITALS — BP 122/64 | HR 72 | Ht 63.0 in | Wt 125.2 lb

## 2017-03-06 DIAGNOSIS — J9611 Chronic respiratory failure with hypoxia: Secondary | ICD-10-CM | POA: Diagnosis not present

## 2017-03-06 DIAGNOSIS — J449 Chronic obstructive pulmonary disease, unspecified: Secondary | ICD-10-CM

## 2017-03-06 MED ORDER — TIOTROPIUM BROMIDE-OLODATEROL 2.5-2.5 MCG/ACT IN AERS: 2.0000 | INHALATION_SPRAY | Freq: Every day | RESPIRATORY_TRACT | 0 refills | Status: DC

## 2017-03-06 NOTE — Patient Instructions (Signed)
   We are going to try you on Stiolto Respimat in place of your Spiriva. Do 2 twists/inhalations off this once daily.  If you don't have any problems with the Stiolto call and we will send in a prescription. This should work better for you.  Call my office if you have any new breathing problems or questions before your next appointment.

## 2017-03-06 NOTE — Progress Notes (Signed)
Subjective:    Patient ID: Kim [name], female    DOB: 1956-04-03, 61 y.o.   MRN: [id number]\  C.C.:  Follow-up for known Severe COPD & Chronic Hypoxic Respiratory Failure.   HPI Patient followed by Dr. Lamonte Sakai. Previous review of electronic medical record noted as follows. Patient hospitalized and subsequently discharged on 12/05/16 with several day history of increasing shortness of breath based on discharge summary. At that time the patient had not been using her prescribed oxygen therapy. Transiently was on noninvasive positive pressure ventilation. Also note patient was evaluated in July in the emergency department for a COPD exacerbation. After previous office visit with me patient was hospitalized on 10/10 with acute exacerbation of COPD as well.  Severe COPD: Referred to pulmonary rehabilitation at last appointment. Plan was to transition back to Spiriva from Duonebs at last appointment. She reports she is doing well with Pulmonary Rehab. She is still using her nebulizer intermittently and alternating with Spiriva. She feels the nebulizer may be more effective. She reports she does still have an intermittent cough. No wheezing. She is using Mucinex for her chest congestion.   Chronic hypoxic respiratory failure: Previously prescribed oxygen at 2 L/m with exertion. She reports she did drop to 85% on her 6MWT at rehab. She is still using oxygen at 2 L/m with her exercise.   Review of Systems No fever, chills, or sweats. No chest pain, tightness, or pressure. No abdominal pain or nausea.  Allergies  Allergen Reactions  . Penicillins Hives    Has patient had a PCN reaction causing immediate rash, facial/tongue/throat swelling, SOB or lightheadedness with hypotension: Yes Has patient had a PCN reaction causing severe rash involving mucus membranes or skin necrosis: No Has patient had a PCN reaction that required hospitalization No Has patient had a PCN reaction occurring within the last 10  years: No If all of the above answers are "NO", then may proceed with Cephalosporin use.     Current Outpatient Medications on File Prior to Visit  Medication Sig Dispense Refill  . albuterol (PROVENTIL) (2.5 MG/3ML) 0.083% nebulizer solution Take 3 mLs (2.5 mg total) by nebulization every 6 (six) hours as needed for wheezing or shortness of breath. DX: J44.1 75 mL 3  . ALPRAZolam (XANAX) 0.5 MG tablet TAKE 1/2 TO 1 TABLET BY MOUTH 3 TIMES A DAY 90 tablet 0  . aspirin EC 81 MG EC tablet Take 1 tablet (81 mg total) by mouth daily.    . Cholecalciferol (VITAMIN D3) 1000 units CAPS Take 1 capsule (1,000 Units total) by mouth 2 (two) times daily. 60 capsule 6  . furosemide (LASIX) 40 MG tablet Take 1 tablet (40 mg total) daily as needed by mouth (for weight gain of 3 lbs). 30 tablet 6  . HYDROcodone-acetaminophen (NORCO) 7.5-325 MG tablet TK 1 T PO Q 4 H PRN FOR MODERATE PAIN 90 tablet 0  . ipratropium-albuterol (DUONEB) 0.5-2.5 (3) MG/3ML SOLN Take 3 mLs by nebulization every 4 (four) hours as needed (cough). 120 mL 3  . metoprolol tartrate (LOPRESSOR) 25 MG tablet Take 0.5 tablets (12.5 mg total) 2 (two) times daily by mouth. 30 tablet 0  . nitroGLYCERIN (NITROSTAT) 0.4 MG SL tablet Place 1 tablet (0.4 mg total) under the tongue every 5 (five) minutes as needed for chest pain. 25 tablet 3  . PAXIL CR 25 MG 24 hr tablet TAKE 1 TABLET BY MOUTH  EVERY MORNING 90 tablet 1  . pravastatin (PRAVACHOL) 20 MG tablet Take  1 tablet (20 mg total) by mouth every evening. 30 tablet 11  . Spacer/Aero Chamber Mouthpiece MISC 1 Device by Does not apply route as directed. 1 each 0  . SYMBICORT 160-4.5 MCG/ACT inhaler inhale 2 puffs by mouth twice a day 10.2 Inhaler 5  . Tiotropium Bromide Monohydrate (SPIRIVA RESPIMAT) 2.5 MCG/ACT AERS Inhale 2 puffs into the lungs daily. (Patient taking differently: Inhale 1 puff into the lungs daily. ) 1 Inhaler 0  . VENTOLIN HFA 108 (90 Base) MCG/ACT inhaler INHALE 2 PUFFS BY  MOUTH INTO THE LUNGS EVERY 4 HOURS AS NEEDED FOR WHEEZING OR SHORTNESS OF BREATH 18 g 0  . vitamin B-12 (CYANOCOBALAMIN) 500 MCG tablet Take 500 mcg by mouth 2 (two) times daily.     No current facility-administered medications on file prior to visit.     Past Medical History:  Diagnosis Date  . C8 RADICULOPATHY 06/05/2009  . COPD 08/31/2007  . DEPRESSION 11/19/2006  . DYSLIPIDEMIA 03/12/2009  . MI (mitral incompetence)   . 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 OSTEOARTHRITIS 06/05/2009  . PARESTHESIA 08/24/2007  . TOBACCO ABUSE 01/25/2010    Past Surgical History:  Procedure Laterality Date  . APPENDECTOMY  1969  . CARDIAC CATHETERIZATION N/A 07/05/2015   Procedure: Left Heart Cath and Coronary Angiography;  Surgeon: Leonie Man, MD;  Location: [facility];  Service: Cardiovascular;  Laterality: N/A;  . CHOLECYSTECTOMY  1989  . collapse lung  1990  . EXPLORATORY LAPAROTOMY  1969    Family History  Problem Relation Age of Onset  . Cancer Mother        lung  . Heart attack Mother   . Hypertension Sister   . Multiple sclerosis Brother   . Diabetes Sister   . Rheum arthritis Sister   . Thyroid disease Sister   . Multiple sclerosis Other   . Alcohol abuse Other   . Arthritis Other   . Diabetes Other   . Kidney disease Other   . Cancer Other        lung,ovarian,skin, uterine  . Stroke Other   . Heart disease Other   . Melanoma Other   . Heart attack Maternal Uncle   . Heart attack Other        NEICE  . Heart attack Other        NEPHEW  . Hypertension Brother   . Stroke Maternal Grandmother   . Colon cancer Neg Hx     Social History   Socioeconomic History  . Marital status: Married    Spouse name: Not on file  . Number of children: 2  . Years of education: Not on file  . Highest education level: Not on file  Social Needs  . Financial resource strain: Not hard at all  . Food insecurity - worry: Never true  . Food insecurity - inability: Never true  .  Transportation needs - medical: No  . Transportation needs - non-medical: No  Occupational History  . Occupation: Patient care aide-sits with elderly lady  Tobacco Use  . Smoking status: Former Smoker    Packs/day: 2.00    Years: 38.00    Pack years: 76.00    Types: Cigarettes    Last attempt to quit: 03/07/2014    Years since quitting: 3.0  . Smokeless tobacco: Never Used  Substance and Sexual Activity  . Alcohol use: No    Alcohol/week: 0.0 oz  . Drug use: No  . Sexual activity: Not on file  Other  Topics Concern  . Not on file  Social History Narrative   [facility] (01/12/17):   Originally from [facility] [place] [facility]. Has lived in [place] for 17 years. Married. Has 2 dogs currently. No mold and only 1 indoor plant. Had her home professionally cleaned a month ago. No bird or hot tub exposure. 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       Objective:   Physical Exam BP 122/64 (BP Location: Left Arm, Cuff Size: Normal)   Pulse 72   Ht 5\' 3"  (1.6 m)   Wt 125 lb 4 oz (56.8 kg)   SpO2 93%   BMI 22.19 kg/m   General:  Awake. No distress. Comfortable. Integument:  No rash. Warm. Dry. Extremities:  No cyanosis or clubbing.  HEENT:  Minimal nasal turbinate swelling. No oral ulcers. Moist membranes. Cardiovascular:  Regular rate. No edema. Unable to appreciate JVD.  Pulmonary:  Faint end expiratory wheeze in the apices. Otherwise good aeration bilaterally. Normal work of breathing on room air. Abdomen: Soft. Normal bowel sounds. Nondistended.  Musculoskeletal:  Normal bulk and tone. Hand grip strength 5/5 bilaterally. No joint deformity or effusion appreciated. Neurological:  Cranial nerves 2-12 grossly in tact. No meningismus. Moving all 4 extremities equally.   PFT 11/16/14: FVC 2.00 L (63%) FEV1 1.00 L (41%) FEV1/FVC 0.50 FEF 25-75 0.42 L (18%) negative bronchodilator response TLC 5.06 L (103%) RV 140% DLCO uncorrected 51%  WALK TEST 01/19/17:  Walked 3 laps /  Baseline Sat 95% on Room Air / Nadir Sat 91% on Room Air  IMAGING PORT CXR 01/14/17 (previously reviewed by me):  Hyperinflation suggested by flattening of the diaphragms. No focal opacity appreciated. No pleural effusion appreciated.  MICROBIOLOGY Respiratory Viral Panel PCR 01/14/17:  Rhinovirus  LABS 01/12/17 Alpha-1 antitrypsin: M1N (102)    Assessment & Plan:  61 y.o. female with severe COPD. Patient has had multiple visits and hospitalization for exacerbation. Seems to be recovering well with minimal residual wheezing. I believe she may benefit from the addition of a LABA to her regimen. She is continuing to participate in pulmonary rehabilitation and enjoying her time there. I instructed the patient to contact my office if she had any new breathing problems or questions before her next appointment.  1. Severe COPD: Switching patient from Spiriva to [employer]. Given sample today. Patient will contact my office for prescription assuming no adverse effects. 2. Chronic hypoxic respiratory failure: Continuing on oxygen as previously prescribed at 2 L/m with exertion. 3. Health maintenance: Status post Prevnar may 2017, Pneumovax February 2008, & Tdap October 2012. High Dose Influenza Vaccine given 10/3. 4. Follow-up: Return to clinic in 3 months or sooner if needed.  Sonia Baller Ashok Cordia, M.D. [facility] Pager:  [phone] After 7pm or if no response, call [phone] 10:28 AM 03/06/17

## 2017-03-06 NOTE — Progress Notes (Signed)
Kim [name] 61 y.o. female   DOB: [birth year] MRN: [id number]          Nutrition 1. Chronic obstructive pulmonary disease with (acute) exacerbation (HCC)    Past Medical History:  Diagnosis Date  . C8 RADICULOPATHY 06/05/2009  . COPD 08/31/2007  . DEPRESSION 11/19/2006  . DYSLIPIDEMIA 03/12/2009  . MI (mitral incompetence)   . 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 OSTEOARTHRITIS 06/05/2009  . PARESTHESIA 08/24/2007  . TOBACCO ABUSE 01/25/2010   Meds reviewed.   Ht: Ht Readings from Last 1 Encounters:  03/06/17 5\' 3"  (1.6 m)     Wt:  Wt Readings from Last 3 Encounters:  03/06/17 125 lb 4 oz (56.8 kg)  03/05/17 126 lb 15.8 oz (57.6 kg)  02/24/17 125 lb 10.6 oz (57 kg)     BMI: 22.19    Current tobacco use? No  Labs:  Lipid Panel     Component Value Date/Time   CHOL 182 03/07/2016 0901   TRIG 151.0 (H) 03/07/2016 0901   HDL 38.70 (L) 03/07/2016 0901   CHOLHDL 5 03/07/2016 0901   VLDL 30.2 03/07/2016 0901   LDLCALC 113 (H) 03/07/2016 0901   LDLDIRECT 155.4 12/29/2012 1209    No results found for: HGBA1C  Nutrition Diagnosis ? Food-and nutrition-related knowledge deficit related to lack of exposure to information as related to diagnosis of pulmonary disease  Goal(s) 1. Describe the benefit of including fruits, vegetables, whole grains, and low-fat dairy products in a healthy meal plan.  Plan:  Pt to attend Pulmonary Nutrition class Will provide client-centered nutrition education as part of interdisciplinary care.   Monitor and evaluate progress toward nutrition goal with team.  Monitor and Evaluate progress toward nutrition goal with team.   Derek Mound, M.Ed, RD, LDN, CDE 03/06/2017 12:05 PM

## 2017-03-09 NOTE — Progress Notes (Signed)
I have reviewed a Home Exercise Prescription with Kim [name] . Kim [name] is not currently exercising at home.  The patient was advised to walk 2-3 days a week for 30 minutes.  Kim [name] and I discussed how to progress their exercise prescription.  The patient stated that their goals were increase quality of life, decrease shortness of breath with ADL's, be able to do more activities with grand kids.  The patient stated that they understand the exercise prescription.  We reviewed exercise guidelines, target heart rate during exercise, oxygen use, weather, home pulse oximeter, endpoints for exercise, and goals.  Patient is encouraged to come to me with any questions. I will continue to follow up with the patient to assist them with progression and safety.

## 2017-03-10 ENCOUNTER — Other Ambulatory Visit: Payer: Self-pay | Admitting: Cardiology

## 2017-03-10 ENCOUNTER — Encounter (HOSPITAL_COMMUNITY)
Admission: RE | Admit: 2017-03-10 | Discharge: 2017-03-10 | Disposition: A | Discharge status: Home / Self Care | Payer: 59 | Source: Ambulatory Visit | Attending: Pulmonary Disease | Admitting: Pulmonary Disease

## 2017-03-10 VITALS — Wt 126.1 lb

## 2017-03-10 DIAGNOSIS — E785 Hyperlipidemia, unspecified: Secondary | ICD-10-CM | POA: Insufficient documentation

## 2017-03-10 DIAGNOSIS — Z87891 Personal history of nicotine dependence: Secondary | ICD-10-CM | POA: Diagnosis not present

## 2017-03-10 DIAGNOSIS — J441 Chronic obstructive pulmonary disease with (acute) exacerbation: Secondary | ICD-10-CM | POA: Insufficient documentation

## 2017-03-10 DIAGNOSIS — Z79899 Other long term (current) drug therapy: Secondary | ICD-10-CM | POA: Diagnosis not present

## 2017-03-10 DIAGNOSIS — Z7982 Long term (current) use of aspirin: Secondary | ICD-10-CM | POA: Diagnosis not present

## 2017-03-10 DIAGNOSIS — Z7951 Long term (current) use of inhaled steroids: Secondary | ICD-10-CM | POA: Diagnosis not present

## 2017-03-10 DIAGNOSIS — F329 Major depressive disorder, single episode, unspecified: Secondary | ICD-10-CM | POA: Diagnosis not present

## 2017-03-10 NOTE — Progress Notes (Signed)
Daily Session Note  Patient Details  Name: Kim [name] MRN: [id number] Date of Birth: [birth year] Referring Provider:     Pulmonary Rehab Walk Test from 02/10/2017 in [place]  Referring Provider  Dr. Ashok Cordia      Encounter Date: 03/10/2017  Check In: Session Check In - 03/10/17 1030      Check-In   Location  [facility]    Staff Present  Rosebud Poles, RN, BSN;Molly diVincenzo, MS, ACSM RCEP, Exercise Physiologist;Lisa Ysidro Evert, RN;Ethelyn Cerniglia Rollene Rotunda, RN, BSN    Supervising physician immediately available to respond to emergencies  [facility] immediately available    Physician(s)  Dr. Nevada Crane    Medication changes reported      No    Fall or balance concerns reported     No    Tobacco Cessation  No Change    Warm-up and Cool-down  Performed as group-led instruction    Resistance Training Performed  Yes    VAD Patient?  No      Pain Assessment   Currently in Pain?  No/denies    Multiple Pain Sites  No       Capillary Blood Glucose: No results found for this or any previous visit (from the past 24 hour(s)).  Exercise Prescription Changes - 03/10/17 1221      Response to Exercise   Blood Pressure (Admit)  112/64    Blood Pressure (Exercise)  118/60    Blood Pressure (Exit)  100/60    Heart Rate (Admit)  64 bpm    Heart Rate (Exercise)  85 bpm    Heart Rate (Exit)  69 bpm    Oxygen Saturation (Admit)  97 %    Oxygen Saturation (Exercise)  96 %    Oxygen Saturation (Exit)  95 %    Rating of Perceived Exertion (Exercise)  13    Perceived Dyspnea (Exercise)  1    Symptoms        Duration  Progress to 45 minutes of aerobic exercise without signs/symptoms of physical distress    Intensity  THRR unchanged      Progression   Progression  Continue to progress workloads to maintain intensity without signs/symptoms of physical distress.      Resistance Training   Training Prescription  Yes    Weight  orange bands    Reps   10-15    Time  10 Minutes      Oxygen   Oxygen  Continuous    Liters  2      Bike   Level  0.8    Minutes  17      NuStep   Level  3    Minutes  17    METs  1.9      Track   Laps  16    Minutes  17      Home Exercise Plan   Plans to continue exercise at  Home (comment)    Frequency  Add 2 additional days to program exercise sessions.       Social History   Tobacco Use  Smoking Status Former Smoker  . Packs/day: 2.00  . Years: 38.00  . Pack years: 76.00  . Types: Cigarettes  . Last attempt to quit: 03/07/2014  . Years since quitting: 3.0  Smokeless Tobacco Never Used    Goals Met:  Independence with exercise equipment Improved SOB with ADL's Using PLB without cueing & demonstrates good technique Exercise tolerated  well No report of cardiac concerns or symptoms Strength training completed today  Goals Unmet:  Not Applicable  Comments: Service time is from 1030 to 1205    Dr. Rush Farmer is Medical Director for Pulmonary Rehab at [facility].

## 2017-03-12 ENCOUNTER — Encounter (HOSPITAL_COMMUNITY)
Admission: RE | Admit: 2017-03-12 | Discharge: 2017-03-12 | Disposition: A | Discharge status: Home / Self Care | Payer: 59 | Source: Ambulatory Visit | Attending: Pulmonary Disease | Admitting: Pulmonary Disease

## 2017-03-12 VITALS — Wt 127.4 lb

## 2017-03-12 DIAGNOSIS — J441 Chronic obstructive pulmonary disease with (acute) exacerbation: Secondary | ICD-10-CM | POA: Diagnosis not present

## 2017-03-12 NOTE — Progress Notes (Signed)
Pulmonary Individual Treatment Plan  Patient Details  Name: Kim [name] MRN: [id number] Date of Birth: 30-May-1955 Referring Provider:     Pulmonary Rehab Walk Test from 02/10/2017 in [place]  Referring Provider  Dr. Ashok Cordia      Initial Encounter Date:    Pulmonary Rehab Walk Test from 02/10/2017 in [place]  Date  02/10/17  Referring Provider  Dr. Ashok Cordia      Visit Diagnosis: Chronic obstructive pulmonary disease with (acute) exacerbation ([place])  Patient's Home Medications on Admission:   Current Outpatient Medications:  .  albuterol (PROVENTIL) (2.5 MG/3ML) 0.083% nebulizer solution, Take 3 mLs (2.5 mg total) by nebulization every 6 (six) hours as needed for wheezing or shortness of breath. DX: J44.1, Disp: 75 mL, Rfl: 3 .  ALPRAZolam (XANAX) 0.5 MG tablet, TAKE 1/2 TO 1 TABLET BY MOUTH 3 TIMES A DAY, Disp: 90 tablet, Rfl: 0 .  aspirin EC 81 MG EC tablet, Take 1 tablet (81 mg total) by mouth daily., Disp: , Rfl:  .  Cholecalciferol (VITAMIN D3) 1000 units CAPS, Take 1 capsule (1,000 Units total) by mouth 2 (two) times daily., Disp: 60 capsule, Rfl: 6 .  furosemide (LASIX) 40 MG tablet, Take 1 tablet (40 mg total) daily as needed by mouth (for weight gain of 3 lbs)., Disp: 30 tablet, Rfl: 6 .  HYDROcodone-acetaminophen (NORCO) 7.5-325 MG tablet, TK 1 T PO Q 4 H PRN FOR MODERATE PAIN, Disp: 90 tablet, Rfl: 0 .  ipratropium-albuterol (DUONEB) 0.5-2.5 (3) MG/3ML SOLN, Take 3 mLs by nebulization every 4 (four) hours as needed (cough)., Disp: 120 mL, Rfl: 3 .  metoprolol tartrate (LOPRESSOR) 25 MG tablet, Take 0.5 tablets (12.5 mg total) 2 (two) times daily by mouth., Disp: 30 tablet, Rfl: 0 .  nitroGLYCERIN (NITROSTAT) 0.4 MG SL tablet, Place 1 tablet (0.4 mg total) under the tongue every 5 (five) minutes as needed for chest pain., Disp: 25 tablet, Rfl: 3 .  PAXIL CR 25 MG 24 hr tablet, TAKE 1 TABLET BY MOUTH  EVERY  MORNING, Disp: 90 tablet, Rfl: 1 .  pravastatin (PRAVACHOL) 20 MG tablet, Take 1 tablet (20 mg total) by mouth every evening., Disp: 30 tablet, Rfl: 11 .  Spacer/Aero Chamber Mouthpiece MISC, 1 Device by Does not apply route as directed., Disp: 1 each, Rfl: 0 .  SYMBICORT 160-4.5 MCG/ACT inhaler, inhale 2 puffs by mouth twice a day, Disp: 10.2 Inhaler, Rfl: 5 .  Tiotropium Bromide Monohydrate (SPIRIVA RESPIMAT) 2.5 MCG/ACT AERS, Inhale 2 puffs into the lungs daily. (Patient taking differently: Inhale 1 puff into the lungs daily. ), Disp: 1 Inhaler, Rfl: 0 .  Tiotropium Bromide-Olodaterol (STIOLTO RESPIMAT) 2.5-2.5 MCG/ACT AERS, Inhale 2 puffs into the lungs daily., Disp: 1 Inhaler, Rfl: 0 .  VENTOLIN HFA 108 (90 Base) MCG/ACT inhaler, INHALE 2 PUFFS BY MOUTH INTO THE LUNGS EVERY 4 HOURS AS NEEDED FOR WHEEZING OR SHORTNESS OF BREATH, Disp: 18 g, Rfl: 0 .  vitamin B-12 (CYANOCOBALAMIN) 500 MCG tablet, Take 500 mcg by mouth 2 (two) times daily., Disp: , Rfl:   Past Medical History: Past Medical History:  Diagnosis Date  . C8 RADICULOPATHY 06/05/2009  . COPD 08/31/2007  . DEPRESSION 11/19/2006  . DYSLIPIDEMIA 03/12/2009  . MI (mitral incompetence)   . 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 OSTEOARTHRITIS 06/05/2009  . PARESTHESIA 08/24/2007  . TOBACCO ABUSE 01/25/2010    Tobacco Use: Social History   Tobacco Use  Smoking Status Former Smoker  .  Packs/day: 2.00  . Years: 38.00  . Pack years: 76.00  . Types: Cigarettes  . Last attempt to quit: 03/07/2014  . Years since quitting: 3.0  Smokeless Tobacco Never Used    Labs: Recent Review Flowsheet Data    Labs for ITP Cardiac and Pulmonary Rehab Latest Ref Rng & Units 07/06/2015 07/07/2015 03/07/2016 12/03/2016 01/14/2017   Cholestrol 0 - 200 mg/dL - 216(H) 182 - -   LDLCALC 0 - 99 mg/dL - 95 113(H) - -   LDLDIRECT mg/dL - - - - -   HDL >39.00 mg/dL - 79 38.70(L) - -   Trlycerides 0.0 - 149.0 mg/dL - 211(H) 151.0(H) - -   PHART 7.350 - 7.450 7.401 - -  7.317(L) -   PCO2ART 32.0 - 48.0 mmHg 36.6 - - 59.4(H) -   HCO3 20.0 - 28.0 mmol/L 22.7 - - 30.6(H) 32.9(H)   TCO2 22 - 32 mmol/L 24 - - 32 34(H)   ACIDBASEDEF 0.0 - 2.0 mmol/L 2.0 - - - -   O2SAT % 89.0 - - 100.0 75.0      Capillary Blood Glucose: Lab Results  Component Value Date   GLUCAP 83 12/05/2016   GLUCAP 117 (H) 07/08/2015   GLUCAP 85 07/08/2015   GLUCAP 107 (H) 07/07/2015   GLUCAP 98 07/07/2015     Pulmonary Assessment Scores: Pulmonary Assessment Scores    Row Name 02/11/17 1014 02/12/17 0722       ADL UCSD   ADL Phase  Entry  Entry    SOB Score total  67  -      CAT Score   CAT Score  29 Entry  -      mMRC Score   mMRC Score  -  3       Pulmonary Function Assessment: Pulmonary Function Assessment - 02/09/17 1020      Post Bronchodilator Spirometry Results   FEV1%  43 %    FEV1/FVC Ratio  48      Breath   Bilateral Breath Sounds  Clear    Shortness of Breath  Yes;Panic with Shortness of Breath;Limiting activity;Fear of Shortness of Breath       Exercise Target Goals:    Exercise Program Goal: Individual exercise prescription set with THRR, safety & activity barriers. Participant demonstrates ability to understand and report RPE using BORG scale, to self-measure pulse accurately, and to acknowledge the importance of the exercise prescription.  Exercise Prescription Goal: Starting with aerobic activity 30 plus minutes a day, 3 days per week for initial exercise prescription. Provide home exercise prescription and guidelines that participant acknowledges understanding prior to discharge.  Activity Barriers & Risk Stratification: Activity Barriers & Cardiac Risk Stratification - 02/09/17 1034      Activity Barriers & Cardiac Risk Stratification   Activity Barriers  Deconditioning;Shortness of Breath;Muscular Weakness       6 Minute Walk: 6 Minute Walk    Row Name 02/12/17 0719         6 Minute Walk   Phase  Initial     Distance  1300  feet     Walk Time  6 minutes     # of Rest Breaks  0     MPH  2.46     METS  2.91     RPE  12     Perceived Dyspnea   1     Symptoms  No     Resting HR  98 bpm  Resting BP  118/75     Resting Oxygen Saturation   93 %     Exercise Oxygen Saturation  during 6 min walk  85 %     Max Ex. HR  146 bpm     Max Ex. BP  148/80       Interval HR   1 Minute HR  105     2 Minute HR  105     3 Minute HR  106     4 Minute HR  107     5 Minute HR  146     6 Minute HR  146     2 Minute Post HR  104     Interval Heart Rate?  Yes       Interval Oxygen   Interval Oxygen?  Yes     Baseline Oxygen Saturation %  93 %     1 Minute Oxygen Saturation %  93 %     1 Minute Liters of Oxygen  0 L     2 Minute Oxygen Saturation %  92 %     2 Minute Liters of Oxygen  0 L     3 Minute Oxygen Saturation %  87 %     3 Minute Liters of Oxygen  0 L     4 Minute Oxygen Saturation %  86 %     4 Minute Liters of Oxygen  0 L     5 Minute Oxygen Saturation %  85 %     5 Minute Liters of Oxygen  0 L     6 Minute Oxygen Saturation %  85 %     6 Minute Liters of Oxygen  0 L     2 Minute Post Oxygen Saturation %  93 %     2 Minute Post Liters of Oxygen  0 L        Oxygen Initial Assessment: Oxygen Initial Assessment - 02/12/17 0721      Initial 6 min Walk   Oxygen Used  None      Program Oxygen Prescription   Program Oxygen Prescription  Continuous;E-Tanks    Liters per minute  2    Comments  Start at 2 liters and titrate from there since patient desaturated to 85% on their 6MWT       Oxygen Re-Evaluation: Oxygen Re-Evaluation    Row Name 02/17/17 1604 03/09/17 1745           Program Oxygen Prescription   Program Oxygen Prescription  Continuous;E-Tanks  Continuous;E-Tanks      Liters per minute  2  2      Comments  Start at 2 liters and titrate from there since patient desaturated to 85% on their 6MWT  patient continues to only need 2 liters of oxygen for exertion        Home Oxygen    Home Oxygen Device  Portable Concentrator;Home Concentrator  Portable Concentrator;Home Concentrator      Sleep Oxygen Prescription  Continuous  Continuous      Liters per minute  2  2      Home Exercise Oxygen Prescription  Continuous  Continuous      Liters per minute  2  2      Home at Rest Exercise Oxygen Prescription  None  None      Compliance with Home Oxygen Use  -  Yes        Goals/Expected Outcomes   Short Term  Goals  To learn and exhibit compliance with exercise, home and travel O2 prescription  To learn and exhibit compliance with exercise, home and travel O2 prescription;To learn and understand importance of monitoring SPO2 with pulse oximeter and demonstrate accurate use of the pulse oximeter.;To learn and understand importance of maintaining oxygen saturations>88%;To learn and demonstrate proper pursed lip breathing techniques or other breathing techniques.;To learn and demonstrate proper use of respiratory medications      Long  Term Goals  Exhibits compliance with exercise, home and travel O2 prescription  Exhibits compliance with exercise, home and travel O2 prescription;Verbalizes importance of monitoring SPO2 with pulse oximeter and return demonstration;Maintenance of O2 saturations>88%;Exhibits proper breathing techniques, such as pursed lip breathing or other method taught during program session;Compliance with respiratory medication;Demonstrates proper use of MDI's         Oxygen Discharge (Final Oxygen Re-Evaluation): Oxygen Re-Evaluation - 03/09/17 1745      Program Oxygen Prescription   Program Oxygen Prescription  Continuous;E-Tanks    Liters per minute  2    Comments  patient continues to only need 2 liters of oxygen for exertion      Home Oxygen   Home Oxygen Device  Portable Concentrator;Home Concentrator    Sleep Oxygen Prescription  Continuous    Liters per minute  2    Home Exercise Oxygen Prescription  Continuous    Liters per minute  2    Home at Rest  Exercise Oxygen Prescription  None    Compliance with Home Oxygen Use  Yes      Goals/Expected Outcomes   Short Term Goals  To learn and exhibit compliance with exercise, home and travel O2 prescription;To learn and understand importance of monitoring SPO2 with pulse oximeter and demonstrate accurate use of the pulse oximeter.;To learn and understand importance of maintaining oxygen saturations>88%;To learn and demonstrate proper pursed lip breathing techniques or other breathing techniques.;To learn and demonstrate proper use of respiratory medications    Long  Term Goals  Exhibits compliance with exercise, home and travel O2 prescription;Verbalizes importance of monitoring SPO2 with pulse oximeter and return demonstration;Maintenance of O2 saturations>88%;Exhibits proper breathing techniques, such as pursed lip breathing or other method taught during program session;Compliance with respiratory medication;Demonstrates proper use of MDI's       Initial Exercise Prescription: Initial Exercise Prescription - 02/12/17 0700      Date of Initial Exercise RX and Referring Provider   Date  02/10/17    Referring Provider  Dr. Ashok Cordia      Oxygen   Oxygen  Continuous    Liters  2      Bike   Level  0.4    Minutes  17      NuStep   Level  2    Minutes  17    METs  1.4      Track   Laps  10    Minutes  17      Prescription Details   Frequency (times per week)  2    Duration  Progress to 45 minutes of aerobic exercise without signs/symptoms of physical distress      Intensity   THRR 40-80% of Max Heartrate  63-126    Ratings of Perceived Exertion  11-13    Perceived Dyspnea  0-4      Progression   Progression  Continue progressive overload as per policy without signs/symptoms or physical distress.      Resistance Training   Training Prescription  Yes  Weight  orange bands    Reps  10-15       Perform Capillary Blood Glucose checks as needed.  Exercise Prescription  Changes: Exercise Prescription Changes    Row Name 02/17/17 1200 02/19/17 1237 02/24/17 1223 03/05/17 1300 03/10/17 1221     Response to Exercise   Blood Pressure (Admit)  102/64  126/60  104/60  116/50  112/64   Blood Pressure (Exercise)  130/70  130/56  130/64  122/72  118/60   Blood Pressure (Exit)  100/60  130/70  108/60  114/58  100/60   Heart Rate (Admit)  75 bpm  72 bpm  65 bpm  65 bpm  64 bpm   Heart Rate (Exercise)  92 bpm  78 bpm  78 bpm  78 bpm  85 bpm   Heart Rate (Exit)  82 bpm  72 bpm  65 bpm  66 bpm  69 bpm   Oxygen Saturation (Admit)  96 %  94 %  98 %  94 %  97 %   Oxygen Saturation (Exercise)  96 %  97 %  97 %  96 %  96 %   Oxygen Saturation (Exit)  95 %  98 %  95 %  97 %  95 %   Rating of Perceived Exertion (Exercise)  '13  13  13  11  13   '$ Perceived Dyspnea (Exercise)  '1  1  2  '$ 0  1   Symptoms  -  -  -  -       Duration  Progress to 45 minutes of aerobic exercise without signs/symptoms of physical distress  Progress to 45 minutes of aerobic exercise without signs/symptoms of physical distress  Progress to 45 minutes of aerobic exercise without signs/symptoms of physical distress  Progress to 45 minutes of aerobic exercise without signs/symptoms of physical distress  Progress to 45 minutes of aerobic exercise without signs/symptoms of physical distress   Intensity  Other (comment) 40-80% of HRR  THRR unchanged  THRR unchanged  THRR unchanged  THRR unchanged     Progression   Progression  Continue to progress workloads to maintain intensity without signs/symptoms of physical distress.  Continue to progress workloads to maintain intensity without signs/symptoms of physical distress.  Continue to progress workloads to maintain intensity without signs/symptoms of physical distress.  Continue to progress workloads to maintain intensity without signs/symptoms of physical distress.  Continue to progress workloads to maintain intensity without signs/symptoms of physical distress.      Resistance Training   Training Prescription  Yes  Yes  Yes  Yes  Yes   Weight  orange bands  orange bands  orange bands  orange bands  orange bands   Reps  10-15  10-15  10-15  10-15  10-15   Time  10 Minutes  10 Minutes  10 Minutes  10 Minutes  10 Minutes     Oxygen   Oxygen  Continuous  Continuous  Continuous  Continuous  Continuous   Liters  '2  2  2  2  2     '$ Bike   Level  0.4  -  0.4  0.4  0.8   Minutes  17  -  '17  17  17     '$ NuStep   Level  '2  2  2  '$ -  3   Minutes  '17  17  17  '$ -  17   METs  1.9  1.8  1.9  -  1.9     Track   Laps  '10  12  14  7  16   '$ Minutes  '17  17  17  17  17     '$ Home Exercise Plan   Plans to continue exercise at  -  -  -  Home (comment)  Home (comment)   Frequency  -  -  -  Add 2 additional days to program exercise sessions.  Add 2 additional days to program exercise sessions.      Exercise Comments: Exercise Comments    Row Name 03/09/17 0712           Exercise Comments  Home exercise completed          Exercise Goals and Review:   Exercise Goals Re-Evaluation : Exercise Goals Re-Evaluation    Row Name 02/19/17 1005 03/09/17 1622           Exercise Goal Re-Evaluation   Exercise Goals Review  Increase Strength and Stamina;Increase Physical Activity;Able to understand and use Dyspnea scale;Able to understand and use rate of perceived exertion (RPE) scale;Knowledge and understanding of Target Heart Rate Range (THRR);Understanding of Exercise Prescription  Increase Strength and Stamina;Increase Physical Activity;Able to understand and use Dyspnea scale;Able to understand and use rate of perceived exertion (RPE) scale;Knowledge and understanding of Target Heart Rate Range (THRR);Understanding of Exercise Prescription      Comments  Patient has only attended one rehab session. Will cont. to monitor and progress as able.   Patient has only attended 4 sessions. Home exercise completed. Plans on attending maintenance at the end of the year. Will  cont. to monitor and progress as able.       Expected Outcomes  Through exercise at rehab and at home, patient will increase strength and stamina and also be able to perform ADL's easier.  Through exercise at rehab and at home, patient will increase strength and stamina making ADL's easier to perform. Patient will also have a better understanding of safe exercise and what they are capable to do outside of clinical supervision.         Discharge Exercise Prescription (Final Exercise Prescription Changes): Exercise Prescription Changes - 03/10/17 1221      Response to Exercise   Blood Pressure (Admit)  112/64    Blood Pressure (Exercise)  118/60    Blood Pressure (Exit)  100/60    Heart Rate (Admit)  64 bpm    Heart Rate (Exercise)  85 bpm    Heart Rate (Exit)  69 bpm    Oxygen Saturation (Admit)  97 %    Oxygen Saturation (Exercise)  96 %    Oxygen Saturation (Exit)  95 %    Rating of Perceived Exertion (Exercise)  13    Perceived Dyspnea (Exercise)  1    Symptoms        Duration  Progress to 45 minutes of aerobic exercise without signs/symptoms of physical distress    Intensity  THRR unchanged      Progression   Progression  Continue to progress workloads to maintain intensity without signs/symptoms of physical distress.      Resistance Training   Training Prescription  Yes    Weight  orange bands    Reps  10-15    Time  10 Minutes      Oxygen   Oxygen  Continuous    Liters  2      Bike   Level  0.8    Minutes  17      NuStep   Level  3    Minutes  17    METs  1.9      Track   Laps  16    Minutes  17      Home Exercise Plan   Plans to continue exercise at  Home (comment)    Frequency  Add 2 additional days to program exercise sessions.       Nutrition:  Target Goals: Understanding of nutrition guidelines, daily intake of sodium '1500mg'$ , cholesterol '200mg'$ , calories 30% from fat and 7% or less from saturated fats, daily to have 5 or more servings of fruits and  vegetables.  Biometrics: Pre Biometrics - 02/09/17 1035      Pre Biometrics   Grip Strength  25 kg        Nutrition Therapy Plan and Nutrition Goals: Nutrition Therapy & Goals - 03/06/17 1208      Nutrition Therapy   Diet  General, Healthful      Personal Nutrition Goals   Nutrition Goal  Describe the benefit of including fruits, vegetables, whole grains, and low-fat dairy products in a healthy meal plan.      Intervention Plan   Intervention  Prescribe, educate and counsel regarding individualized specific dietary modifications aiming towards targeted core components such as weight, hypertension, lipid management, diabetes, heart failure and other comorbidities.    Expected Outcomes  Short Term Goal: Understand basic principles of dietary content, such as calories, fat, sodium, cholesterol and nutrients.;Long Term Goal: Adherence to prescribed nutrition plan.       Nutrition Discharge: Rate Your Plate Scores: Nutrition Assessments - 03/06/17 1208      Rate Your Plate Scores   Pre Score  47       Nutrition Goals Re-Evaluation:   Nutrition Goals Discharge (Final Nutrition Goals Re-Evaluation):   Psychosocial: Target Goals: Acknowledge presence or absence of significant depression and/or stress, maximize coping skills, provide positive support system. Participant is able to verbalize types and ability to use techniques and skills needed for reducing stress and depression.  Initial Review & Psychosocial Screening: Initial Psych Review & Screening - 02/09/17 1359      Initial Review   Current issues with  None Identified      Family Dynamics   Good Support System?  Yes      Barriers   Psychosocial barriers to participate in program  There are no identifiable barriers or psychosocial needs.      Screening Interventions   Interventions  Encouraged to exercise       Quality of Life Scores:   PHQ-9: Recent Review Flowsheet Data    Depression screen [facility] 2/9  02/09/2017   Decreased Interest 0   Down, Depressed, Hopeless 3   PHQ - 2 Score 3   Altered sleeping 0   Tired, decreased energy 1   Change in appetite 0   Feeling bad or failure about yourself  2   Trouble concentrating 0   Moving slowly or fidgety/restless 1   Suicidal thoughts 2   PHQ-9 Score 9   Difficult doing work/chores Not difficult at all     Interpretation of Total Score  Total Score Depression Severity:  1-4 = Minimal depression, 5-9 = Mild depression, 10-14 = Moderate depression, 15-19 = Moderately severe depression, 20-27 = Severe depression   Psychosocial Evaluation and Intervention: Psychosocial Evaluation - 02/09/17 1400      Psychosocial Evaluation & Interventions   Interventions  Encouraged  to exercise with the program and follow exercise prescription    Continue Psychosocial Services   No Follow up required       Psychosocial Re-Evaluation: Psychosocial Re-Evaluation    [place] Name 02/17/17 1608 03/09/17 1751           Psychosocial Re-Evaluation   Current issues with  None Identified  None Identified      Expected Outcomes  patient will remain free from psychosocial barriers to participation in pulmonary rehab  patient will remain free from psychosocial barriers to participation in pulmonary rehab      Interventions  Encouraged to attend Cardiac Rehabilitation for the exercise  Encouraged to attend Cardiac Rehabilitation for the exercise      Continue Psychosocial Services   No Follow up required  No Follow up required         Psychosocial Discharge (Final Psychosocial Re-Evaluation): Psychosocial Re-Evaluation - 03/09/17 1751      Psychosocial Re-Evaluation   Current issues with  None Identified    Expected Outcomes  patient will remain free from psychosocial barriers to participation in pulmonary rehab    Interventions  Encouraged to attend Cardiac Rehabilitation for the exercise    Continue Psychosocial Services   No Follow up required        Education: Education Goals: Education classes will be provided on a weekly basis, covering required topics. Participant will state understanding/return demonstration of topics presented.  Learning Barriers/Preferences: Learning Barriers/Preferences - 02/09/17 1018      Learning Barriers/Preferences   Learning Barriers  None    Learning Preferences  Individual Instruction       Education Topics: Risk Factor Reduction:  -Group instruction that is supported by a PowerPoint presentation. Instructor discusses the definition of a risk factor, different risk factors for pulmonary disease, and how the heart and lungs work together.     Nutrition for Pulmonary Patient:  -Group instruction provided by PowerPoint slides, verbal discussion, and written materials to support subject matter. The instructor gives an explanation and review of healthy diet recommendations, which includes a discussion on weight management, recommendations for fruit and vegetable consumption, as well as protein, fluid, caffeine, fiber, sodium, sugar, and alcohol. Tips for eating when patients are short of breath are discussed.   Pursed Lip Breathing:  -Group instruction that is supported by demonstration and informational handouts. Instructor discusses the benefits of pursed lip and diaphragmatic breathing and detailed demonstration on how to preform both.     Oxygen Safety:  -Group instruction provided by PowerPoint, verbal discussion, and written material to support subject matter. There is an overview of "What is Oxygen" and "Why do we need it".  Instructor also reviews how to create a safe environment for oxygen use, the importance of using oxygen as prescribed, and the risks of noncompliance. There is a brief discussion on traveling with oxygen and resources the patient may utilize.   Oxygen Equipment:  -Group instruction provided by [facility] Staff utilizing handouts, written materials, and equipment  demonstrations.   PULMONARY REHAB CHRONIC OBSTRUCTIVE PULMONARY DISEASE from 03/05/2017 in [place]  Date  03/05/17  Educator  [facility]  Instruction Review Code  2- meets goals/outcomes      Signs and Symptoms:  -Group instruction provided by written material and verbal discussion to support subject matter. Warning signs and symptoms of infection, stroke, and heart attack are reviewed and when to call the physician/911 reinforced. Tips for preventing the spread of infection discussed.  Advanced Directives:  -Group instruction provided by verbal instruction and written material to support subject matter. Instructor reviews Advanced Directive laws and proper instruction for filling out document.   Pulmonary Video:  -Group video education that reviews the importance of medication and oxygen compliance, exercise, good nutrition, pulmonary hygiene, and pursed lip and diaphragmatic breathing for the pulmonary patient.   Exercise for the Pulmonary Patient:  -Group instruction that is supported by a PowerPoint presentation. Instructor discusses benefits of exercise, core components of exercise, frequency, duration, and intensity of an exercise routine, importance of utilizing pulse oximetry during exercise, safety while exercising, and options of places to exercise outside of rehab.     PULMONARY REHAB CHRONIC OBSTRUCTIVE PULMONARY DISEASE from 03/05/2017 in [place]  Date  02/19/17  Educator  Cloyde Reams  Instruction Review Code  2- meets goals/outcomes      Pulmonary Medications:  -Verbally interactive group education provided by instructor with focus on inhaled medications and proper administration.   Anatomy and Physiology of the Respiratory System and Intimacy:  -Group instruction provided by PowerPoint, verbal discussion, and written material to support subject matter. Instructor reviews respiratory cycle and  anatomical components of the respiratory system and their functions. Instructor also reviews differences in obstructive and restrictive respiratory diseases with examples of each. Intimacy, Sex, and Sexuality differences are reviewed with a discussion on how relationships can change when diagnosed with pulmonary disease. Common sexual concerns are reviewed.   MD DAY -A group question and answer session with a medical doctor that allows participants to ask questions that relate to their pulmonary disease state.   OTHER EDUCATION -Group or individual verbal, written, or video instructions that support the educational goals of the pulmonary rehab program.   Knowledge Questionnaire Score: Knowledge Questionnaire Score - 02/11/17 1014      Knowledge Questionnaire Score   Pre Score  9/13       Core Components/Risk Factors/Patient Goals at Admission: Personal Goals and Risk Factors at Admission - 02/09/17 1400      Core Components/Risk Factors/Patient Goals on Admission   Improve shortness of breath with ADL's  Yes    Intervention  Provide education, individualized exercise plan and daily activity instruction to help decrease symptoms of SOB with activities of daily living.    Expected Outcomes  Short Term: Achieves a reduction of symptoms when performing activities of daily living.    Develop more efficient breathing techniques such as purse lipped breathing and diaphragmatic breathing; and practicing self-pacing with activity  Yes    Intervention  Provide education, demonstration and support about specific breathing techniuqes utilized for more efficient breathing. Include techniques such as pursed lipped breathing, diaphragmatic breathing and self-pacing activity.    Expected Outcomes  Short Term: Participant will be able to demonstrate and use breathing techniques as needed throughout daily activities.    Increase knowledge of respiratory medications and ability to use respiratory devices  properly   Yes    Intervention  Provide education and demonstration as needed of appropriate use of medications, inhalers, and oxygen therapy.    Expected Outcomes  Short Term: Achieves understanding of medications use. Understands that oxygen is a medication prescribed by physician. Demonstrates appropriate use of inhaler and oxygen therapy.       Core Components/Risk Factors/Patient Goals Review:  Goals and Risk Factor Review    Row Name 02/17/17 1606 03/09/17 1746           Core Components/Risk Factors/Patient Goals Review  Personal Goals Review  Improve shortness of breath with ADL's;Develop more efficient breathing techniques such as purse lipped breathing and diaphragmatic breathing and practicing self-pacing with activity.;Increase knowledge of respiratory medications and ability to use respiratory devices properly.  Improve shortness of breath with ADL's;Develop more efficient breathing techniques such as purse lipped breathing and diaphragmatic breathing and practicing self-pacing with activity.;Increase knowledge of respiratory medications and ability to use respiratory devices properly.      Review  no progress towards goals related to only attending 1 session since admission  patient progressing towards rehab goals. Patient only staying through December r/t financial reasons and insurance coverage and has been encouraged to enroll in Maintenance. Observed utilizing PLB, states some improvement in her SOB with ADLs. Fast tracked through alot of education with patient related to stay in pulmonry rehab limited.      Expected Outcomes  see admission outcomes  see admission outcomes         Core Components/Risk Factors/Patient Goals at Discharge (Final Review):  Goals and Risk Factor Review - 03/09/17 1746      Core Components/Risk Factors/Patient Goals Review   Personal Goals Review  Improve shortness of breath with ADL's;Develop more efficient breathing techniques such as purse  lipped breathing and diaphragmatic breathing and practicing self-pacing with activity.;Increase knowledge of respiratory medications and ability to use respiratory devices properly.    Review  patient progressing towards rehab goals. Patient only staying through December r/t financial reasons and insurance coverage and has been encouraged to enroll in Maintenance. Observed utilizing PLB, states some improvement in her SOB with ADLs. Fast tracked through alot of education with patient related to stay in pulmonry rehab limited.    Expected Outcomes  see admission outcomes       ITP Comments:   Comments: patient has attended 5 sessions since admission

## 2017-03-12 NOTE — Progress Notes (Signed)
Daily Session Note  Patient Details  Name: Kim [name] MRN: [id number] Date of Birth: 12/31/1955 Referring Provider:     Pulmonary Rehab Walk Test from 02/10/2017 in [place]  Referring Provider  Dr. Ashok Cordia      Encounter Date: 03/12/2017  Check In: Session Check In - 03/12/17 1053      Check-In   Location  [facility]    Staff Present  Trish Fountain, RN, BSN;Prather Failla Ysidro Evert, RN;Joan [place], RN, BSN;Molly diVincenzo, MS, ACSM RCEP, Exercise Physiologist    Supervising physician immediately available to respond to emergencies  [facility] immediately available    Physician(s)  Dr. Starla Link    Medication changes reported      No    Fall or balance concerns reported     No    Tobacco Cessation  No Change    Warm-up and Cool-down  Performed as group-led instruction    Resistance Training Performed  Yes    VAD Patient?  No      Pain Assessment   Currently in Pain?  No/denies    Multiple Pain Sites  No       Capillary Blood Glucose: No results found for this or any previous visit (from the past 24 hour(s)).  Exercise Prescription Changes - 03/12/17 1200      Response to Exercise   Blood Pressure (Admit)  130/64    Blood Pressure (Exercise)  138/70    Blood Pressure (Exit)  100/60    Heart Rate (Admit)  61 bpm    Heart Rate (Exercise)  80 bpm    Heart Rate (Exit)  67 bpm    Oxygen Saturation (Admit)  93 %    Oxygen Saturation (Exercise)  95 %    Oxygen Saturation (Exit)  94 %    Rating of Perceived Exertion (Exercise)  13    Perceived Dyspnea (Exercise)  1    Duration  Progress to 45 minutes of aerobic exercise without signs/symptoms of physical distress    Intensity  THRR unchanged      Progression   Progression  Continue to progress workloads to maintain intensity without signs/symptoms of physical distress.      Resistance Training   Training Prescription  Yes    Weight  orange bands    Reps  10-15    Time   10 Minutes      Oxygen   Oxygen  Continuous    Liters  2      Bike   Level  0.8    Minutes  17      NuStep   Level  3    Minutes  17    METs  1.3       Social History   Tobacco Use  Smoking Status Former Smoker  . Packs/day: 2.00  . Years: 38.00  . Pack years: 76.00  . Types: Cigarettes  . Last attempt to quit: 03/07/2014  . Years since quitting: 3.0  Smokeless Tobacco Never Used    Goals Met:  Exercise tolerated well No report of cardiac concerns or symptoms Strength training completed today  Goals Unmet:  Not Applicable  Comments: Service time is from 1030 to 1230    Dr. Rush Farmer is Medical Director for Pulmonary Rehab at [facility]

## 2017-03-12 NOTE — Progress Notes (Signed)
Kim [name] 61 y.o. female   DOB: 1955/09/17 MRN: [id number]          Nutrition 1. Chronic obstructive pulmonary disease with (acute) exacerbation ([place])   Note Spoke with pt. There are some ways the pt can make her eating habits healthier. Pt's Rate Your Plate results reviewed with pt. Pt is the primary cook and her husband has been getting the groceries due to pt's illness. Pt expressed understanding of the information reviewed via feedback method.    Nutrition Diagnosis ? Food-and nutrition-related knowledge deficit related to lack of exposure to information as related to diagnosis of pulmonary disease  Nutrition Intervention ? Pt's individual nutrition plan and goals reviewed with pt. ? Benefits of adopting healthy eating habits discussed when pt's Rate Your Plate reviewed. (e.g. Incorporating more fatty fish, fruits and veggies in pt diet). ? Pt to attend the Nutrition and Lung Disease class  Goal(s) 1. Describe the benefit of including fruits, vegetables, whole grains, and low-fat dairy products in a healthy meal plan.  Plan:  Pt to attend Pulmonary Nutrition class Will provide client-centered nutrition education as part of interdisciplinary care.   Monitor and evaluate progress toward nutrition goal with team.  Monitor and Evaluate progress toward nutrition goal with team.   Derek Mound, M.Ed, RD, LDN, CDE 03/12/2017 12:29 PM

## 2017-03-17 ENCOUNTER — Encounter (HOSPITAL_COMMUNITY): Payer: 59

## 2017-03-18 ENCOUNTER — Other Ambulatory Visit: Payer: Self-pay

## 2017-03-18 MED ORDER — METOPROLOL TARTRATE 25 MG PO TABS: 12.5000 mg | ORAL_TABLET | Freq: Two times a day (BID) | ORAL | 2 refills | Status: DC

## 2017-03-19 ENCOUNTER — Encounter (HOSPITAL_COMMUNITY): Payer: 59

## 2017-03-23 ENCOUNTER — Encounter: Payer: Self-pay | Admitting: Internal Medicine

## 2017-03-23 ENCOUNTER — Ambulatory Visit (INDEPENDENT_AMBULATORY_CARE_PROVIDER_SITE_OTHER): Payer: 59 | Admitting: Internal Medicine

## 2017-03-23 VITALS — BP 118/62 | HR 80 | Temp 97.9°F | Ht 63.0 in | Wt 126.8 lb

## 2017-03-23 DIAGNOSIS — Z0000 Encounter for general adult medical examination without abnormal findings: Secondary | ICD-10-CM | POA: Diagnosis not present

## 2017-03-23 NOTE — Progress Notes (Signed)
Subjective:    Patient ID: Kim [name], female    DOB: 10-20-1955, 61 y.o.   MRN: [id number]  HPI  61 year old patient who is seen today for a preventive health examination  She is followed by pulmonary medicine with a history of advanced COPD and chronic respiratory failure.  She has a history of osteoarthritis as well as coronary artery calcification noted on CT scanning.  She is on lipid therapy.  Doing well without concerns or complaints  Last colonoscopy was December 2014 Declines mammograms  Past Medical History:  Diagnosis Date  . C8 RADICULOPATHY 06/05/2009  . COPD 08/31/2007  . DEPRESSION 11/19/2006  . DYSLIPIDEMIA 03/12/2009  . MI (mitral incompetence)   . 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 OSTEOARTHRITIS 06/05/2009  . PARESTHESIA 08/24/2007  . TOBACCO ABUSE 01/25/2010     Social History   Socioeconomic History  . Marital status: Married    Spouse name: Not on file  . Number of children: 2  . Years of education: Not on file  . Highest education level: Not on file  Social Needs  . Financial resource strain: Not hard at all  . Food insecurity - worry: Never true  . Food insecurity - inability: Never true  . Transportation needs - medical: No  . Transportation needs - non-medical: No  Occupational History  . Occupation: Patient care aide-sits with elderly lady  Tobacco Use  . Smoking status: Former Smoker    Packs/day: 2.00    Years: 38.00    Pack years: 76.00    Types: Cigarettes    Last attempt to quit: 03/07/2014    Years since quitting: 3.0  . Smokeless tobacco: Never Used  Substance and Sexual Activity  . Alcohol use: No    Alcohol/week: 0.0 oz  . Drug use: No  . Sexual activity: Not on file  Other Topics Concern  . Not on file  Social History Narrative   [facility] (01/12/17):   Originally from [facility]. Has lived in [place] for 17 years. Married. Has 2 dogs currently. No mold and only 1 indoor plant. Had her home professionally cleaned a month ago. No  bird or hot tub exposure. 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     Past Surgical History:  Procedure Laterality Date  . APPENDECTOMY  1969  . CARDIAC CATHETERIZATION N/A 07/05/2015   Procedure: Left Heart Cath and Coronary Angiography;  Surgeon: Leonie Man, MD;  Location: [facility];  Service: Cardiovascular;  Laterality: N/A;  . CHOLECYSTECTOMY  1989  . collapse lung  1990  . EXPLORATORY LAPAROTOMY  1969    Family History  Problem Relation Age of Onset  . Cancer Mother        lung  . Heart attack Mother   . Hypertension Sister   . Multiple sclerosis Brother   . Diabetes Sister   . Rheum arthritis Sister   . Thyroid disease Sister   . Multiple sclerosis Other   . Alcohol abuse Other   . Arthritis Other   . Diabetes Other   . Kidney disease Other   . Cancer Other        lung,ovarian,skin, uterine  . Stroke Other   . Heart disease Other   . Melanoma Other   . Heart attack Maternal Uncle   . Heart attack Other        NEICE  . Heart attack Other        NEPHEW  .  Hypertension Brother   . Stroke Maternal Grandmother   . Colon cancer Neg Hx     Allergies  Allergen Reactions  . Penicillins Hives    Has patient had a PCN reaction causing immediate rash, facial/tongue/throat swelling, SOB or lightheadedness with hypotension: Yes Has patient had a PCN reaction causing severe rash involving mucus membranes or skin necrosis: No Has patient had a PCN reaction that required hospitalization No Has patient had a PCN reaction occurring within the last 10 years: No If all of the above answers are "NO", then may proceed with Cephalosporin use.     Current Outpatient Medications on File Prior to Visit  Medication Sig Dispense Refill  . albuterol (PROVENTIL) (2.5 MG/3ML) 0.083% nebulizer solution Take 3 mLs (2.5 mg total) by nebulization every 6 (six) hours as needed for wheezing or shortness of breath. DX: J44.1 75 mL 3  . ALPRAZolam  (XANAX) 0.5 MG tablet TAKE 1/2 TO 1 TABLET BY MOUTH 3 TIMES A DAY 90 tablet 0  . aspirin EC 81 MG EC tablet Take 1 tablet (81 mg total) by mouth daily.    . Cholecalciferol (VITAMIN D3) 1000 units CAPS Take 1 capsule (1,000 Units total) by mouth 2 (two) times daily. 60 capsule 6  . furosemide (LASIX) 40 MG tablet Take 1 tablet (40 mg total) daily as needed by mouth (for weight gain of 3 lbs). 30 tablet 6  . HYDROcodone-acetaminophen (NORCO) 7.5-325 MG tablet TK 1 T PO Q 4 H PRN FOR MODERATE PAIN 90 tablet 0  . ipratropium-albuterol (DUONEB) 0.5-2.5 (3) MG/3ML SOLN Take 3 mLs by nebulization every 4 (four) hours as needed (cough). 120 mL 3  . metoprolol tartrate (LOPRESSOR) 25 MG tablet Take 0.5 tablets (12.5 mg total) by mouth 2 (two) times daily. 30 tablet 2  . nitroGLYCERIN (NITROSTAT) 0.4 MG SL tablet Place 1 tablet (0.4 mg total) under the tongue every 5 (five) minutes as needed for chest pain. 25 tablet 3  . PAXIL CR 25 MG 24 hr tablet TAKE 1 TABLET BY MOUTH  EVERY MORNING 90 tablet 1  . pravastatin (PRAVACHOL) 20 MG tablet Take 1 tablet (20 mg total) by mouth every evening. 30 tablet 11  . Spacer/Aero Chamber Mouthpiece MISC 1 Device by Does not apply route as directed. 1 each 0  . SYMBICORT 160-4.5 MCG/ACT inhaler inhale 2 puffs by mouth twice a day 10.2 Inhaler 5  . Tiotropium Bromide Monohydrate (SPIRIVA RESPIMAT) 2.5 MCG/ACT AERS Inhale 2 puffs into the lungs daily. (Patient taking differently: Inhale 1 puff into the lungs daily. ) 1 Inhaler 0  . Tiotropium Bromide-Olodaterol (STIOLTO RESPIMAT) 2.5-2.5 MCG/ACT AERS Inhale 2 puffs into the lungs daily. 1 Inhaler 0  . VENTOLIN HFA 108 (90 Base) MCG/ACT inhaler INHALE 2 PUFFS BY MOUTH INTO THE LUNGS EVERY 4 HOURS AS NEEDED FOR WHEEZING OR SHORTNESS OF BREATH 18 g 0  . vitamin B-12 (CYANOCOBALAMIN) 500 MCG tablet Take 500 mcg by mouth 2 (two) times daily.     No current facility-administered medications on file prior to visit.     BP 118/62  (BP Location: Left Arm, Patient Position: Sitting, Cuff Size: Normal)   Pulse 80   Temp 97.9 F (36.6 C) (Oral)   Ht 5\' 3"  (1.6 m)   Wt 126 lb 12.8 oz (57.5 kg)   SpO2 92%   BMI 22.46 kg/m     Review of Systems  Constitutional: Negative.   HENT: Negative for congestion, dental problem, hearing loss, rhinorrhea, sinus  pressure, sore throat and tinnitus.   Eyes: Negative for pain, discharge and visual disturbance.  Respiratory: Positive for shortness of breath. Negative for cough.   Cardiovascular: Negative for chest pain, palpitations and leg swelling.  Gastrointestinal: Negative for abdominal distention, abdominal pain, blood in stool, constipation, diarrhea, nausea and vomiting.  Genitourinary: Negative for difficulty urinating, dysuria, flank pain, frequency, hematuria, pelvic pain, urgency, vaginal bleeding, vaginal discharge and vaginal pain.  Musculoskeletal: Negative for arthralgias, gait problem and joint swelling.  Skin: Negative for rash.  Neurological: Negative for dizziness, syncope, speech difficulty, weakness, numbness and headaches.  Hematological: Negative for adenopathy.  Psychiatric/Behavioral: Negative for agitation, behavioral problems and dysphoric mood. The patient is not nervous/anxious.        Objective:   Physical Exam  Constitutional: She is oriented to person, place, and time. She appears well-developed and well-nourished.  HENT:  Head: Normocephalic.  Right Ear: External ear normal.  Left Ear: External ear normal.  Mouth/Throat: Oropharynx is clear and moist.  Eyes: Conjunctivae and EOM are normal. Pupils are equal, round, and reactive to light.  Neck: Normal range of motion. Neck supple. No thyromegaly present.  Cardiovascular: Normal rate, regular rhythm, normal heart sounds and intact distal pulses.  Pulmonary/Chest: Effort normal and breath sounds normal.  Slight diminished breath sounds but clear O2 saturation at rest 92%  Abdominal: Soft.  Bowel sounds are normal. She exhibits no mass. There is no tenderness.  Right upper quadrant and midline surgical scars  Musculoskeletal: Normal range of motion.  Lymphadenopathy:    She has no cervical adenopathy.  Neurological: She is alert and oriented to person, place, and time.  Skin: Skin is warm and dry. No rash noted.  Psychiatric: She has a normal mood and affect. Her behavior is normal.          Assessment & Plan:   Preventive health examination COPD Osteoarthritis Coronary artery calcifications continue statin therapy  Follow-up pulmonary medicine  Return here in 1 year or as needed  [place]

## 2017-03-23 NOTE — Patient Instructions (Addendum)
Continue pulmonary rehab    It is important that you exercise regularly, at least 20 minutes 3 to 4 times per week.  If you develop chest pain or shortness of breath seek  medical attention.  Pulmonary follow-up as scheduled   Health Maintenance for Postmenopausal Women Menopause is a normal process in which your reproductive ability comes to an end. This process happens gradually over a span of months to years, usually between the ages of 68 and 53. Menopause is complete when you have missed 12 consecutive menstrual periods. It is important to talk with your health care provider about some of the most common conditions that affect postmenopausal women, such as heart disease, cancer, and bone loss (osteoporosis). Adopting a healthy lifestyle and getting preventive care can help to promote your health and wellness. Those actions can also lower your chances of developing some of these common conditions. What should I know about menopause? During menopause, you may experience a number of symptoms, such as:  Moderate-to-severe hot flashes.  Night sweats.  Decrease in sex drive.  Mood swings.  Headaches.  Tiredness.  Irritability.  Memory problems.  Insomnia.  Choosing to treat or not to treat menopausal changes is an individual decision that you make with your health care provider. What should I know about hormone replacement therapy and supplements? Hormone therapy products are effective for treating symptoms that are associated with menopause, such as hot flashes and night sweats. Hormone replacement carries certain risks, especially as you become older. If you are thinking about using estrogen or estrogen with progestin treatments, discuss the benefits and risks with your health care provider. What should I know about heart disease and stroke? Heart disease, heart attack, and stroke become more likely as you age. This may be due, in part, to the hormonal changes that your body  experiences during menopause. These can affect how your body processes dietary fats, triglycerides, and cholesterol. Heart attack and stroke are both medical emergencies. There are many things that you can do to help prevent heart disease and stroke:  Have your blood pressure checked at least every 1-2 years. High blood pressure causes heart disease and increases the risk of stroke.  If you are 5-39 years old, ask your health care provider if you should take aspirin to prevent a heart attack or a stroke.  Do not use any tobacco products, including cigarettes, chewing tobacco, or electronic cigarettes. If you need help quitting, ask your health care provider.  It is important to eat a healthy diet and maintain a healthy weight. ? Be sure to include plenty of vegetables, fruits, low-fat dairy products, and lean protein. ? Avoid eating foods that are high in solid fats, added sugars, or salt (sodium).  Get regular exercise. This is one of the most important things that you can do for your health. ? Try to exercise for at least 150 minutes each week. The type of exercise that you do should increase your heart rate and make you sweat. This is known as moderate-intensity exercise. ? Try to do strengthening exercises at least twice each week. Do these in addition to the moderate-intensity exercise.  Know your numbers.Ask your health care provider to check your cholesterol and your blood glucose. Continue to have your blood tested as directed by your health care provider.  What should I know about cancer screening? There are several types of cancer. Take the following steps to reduce your risk and to catch any cancer development as early as  possible. Breast Cancer  Practice breast self-awareness. ? This means understanding how your breasts normally appear and feel. ? It also means doing regular breast self-exams. Let your health care provider know about any changes, no matter how small.  If you  are 47 or older, have a clinician do a breast exam (clinical breast exam or CBE) every year. Depending on your age, family history, and medical history, it may be recommended that you also have a yearly breast X-ray (mammogram).  If you have a family history of breast cancer, talk with your health care provider about genetic screening.  If you are at high risk for breast cancer, talk with your health care provider about having an MRI and a mammogram every year.  Breast cancer (BRCA) gene test is recommended for women who have family members with BRCA-related cancers. Results of the assessment will determine the need for genetic counseling and BRCA1 and for BRCA2 testing. BRCA-related cancers include these types: ? Breast. This occurs in males or females. ? Ovarian. ? Tubal. This may also be called fallopian tube cancer. ? Cancer of the abdominal or pelvic lining (peritoneal cancer). ? Prostate. ? Pancreatic.  Cervical, Uterine, and Ovarian Cancer Your health care provider may recommend that you be screened regularly for cancer of the pelvic organs. These include your ovaries, uterus, and vagina. This screening involves a pelvic exam, which includes checking for microscopic changes to the surface of your cervix (Pap test).  For women ages 21-65, health care providers may recommend a pelvic exam and a Pap test every three years. For women ages 47-65, they may recommend the Pap test and pelvic exam, combined with testing for human papilloma virus (HPV), every five years. Some types of HPV increase your risk of cervical cancer. Testing for HPV may also be done on women of any age who have unclear Pap test results.  Other health care providers may not recommend any screening for nonpregnant women who are considered low risk for pelvic cancer and have no symptoms. Ask your health care provider if a screening pelvic exam is right for you.  If you have had past treatment for cervical cancer or a  condition that could lead to cancer, you need Pap tests and screening for cancer for at least 20 years after your treatment. If Pap tests have been discontinued for you, your risk factors (such as having a new sexual partner) need to be reassessed to determine if you should start having screenings again. Some women have medical problems that increase the chance of getting cervical cancer. In these cases, your health care provider may recommend that you have screening and Pap tests more often.  If you have a family history of uterine cancer or ovarian cancer, talk with your health care provider about genetic screening.  If you have vaginal bleeding after reaching menopause, tell your health care provider.  There are currently no reliable tests available to screen for ovarian cancer.  Lung Cancer Lung cancer screening is recommended for adults 21-89 years old who are at high risk for lung cancer because of a history of smoking. A yearly low-dose CT scan of the lungs is recommended if you:  Currently smoke.  Have a history of at least 30 pack-years of smoking and you currently smoke or have quit within the past 15 years. A pack-year is smoking an average of one pack of cigarettes per day for one year.  Yearly screening should:  Continue until it has been 15 years  since you quit.  Stop if you develop a health problem that would prevent you from having lung cancer treatment.  Colorectal Cancer  This type of cancer can be detected and can often be prevented.  Routine colorectal cancer screening usually begins at age 28 and continues through age 48.  If you have risk factors for colon cancer, your health care provider may recommend that you be screened at an earlier age.  If you have a family history of colorectal cancer, talk with your health care provider about genetic screening.  Your health care provider may also recommend using home test kits to check for hidden blood in your stool.  A  small camera at the end of a tube can be used to examine your colon directly (sigmoidoscopy or colonoscopy). This is done to check for the earliest forms of colorectal cancer.  Direct examination of the colon should be repeated every 5-10 years until age 38. However, if early forms of precancerous polyps or small growths are found or if you have a family history or genetic risk for colorectal cancer, you may need to be screened more often.  Skin Cancer  Check your skin from head to toe regularly.  Monitor any moles. Be sure to tell your health care provider: ? About any new moles or changes in moles, especially if there is a change in a mole's shape or color. ? If you have a mole that is larger than the size of a pencil eraser.  If any of your family members has a history of skin cancer, especially at a young age, talk with your health care provider about genetic screening.  Always use sunscreen. Apply sunscreen liberally and repeatedly throughout the day.  Whenever you are outside, protect yourself by wearing long sleeves, pants, a wide-brimmed hat, and sunglasses.  What should I know about osteoporosis? Osteoporosis is a condition in which bone destruction happens more quickly than new bone creation. After menopause, you may be at an increased risk for osteoporosis. To help prevent osteoporosis or the bone fractures that can happen because of osteoporosis, the following is recommended:  If you are 3-53 years old, get at least 1,000 mg of calcium and at least 600 mg of vitamin D per day.  If you are older than age 40 but younger than age 26, get at least 1,200 mg of calcium and at least 600 mg of vitamin D per day.  If you are older than age 74, get at least 1,200 mg of calcium and at least 800 mg of vitamin D per day.  Smoking and excessive alcohol intake increase the risk of osteoporosis. Eat foods that are rich in calcium and vitamin D, and do weight-bearing exercises several times  each week as directed by your health care provider. What should I know about how menopause affects my mental health? Depression may occur at any age, but it is more common as you become older. Common symptoms of depression include:  Low or sad mood.  Changes in sleep patterns.  Changes in appetite or eating patterns.  Feeling an overall lack of motivation or enjoyment of activities that you previously enjoyed.  Frequent crying spells.  Talk with your health care provider if you think that you are experiencing depression. What should I know about immunizations? It is important that you get and maintain your immunizations. These include:  Tetanus, diphtheria, and pertussis (Tdap) booster vaccine.  Influenza every year before the flu season begins.  Pneumonia vaccine.  Shingles vaccine.  Your health care provider may also recommend other immunizations. This information is not intended to replace advice given to you by your health care provider. Make sure you discuss any questions you have with your health care provider. Document Released: 05/16/2005 Document Revised: 10/12/2015 Document Reviewed: 12/26/2014 Elsevier Interactive Patient Education  2018 [employer].

## 2017-03-24 ENCOUNTER — Encounter (HOSPITAL_COMMUNITY)
Admission: RE | Admit: 2017-03-24 | Discharge: 2017-03-24 | Disposition: A | Discharge status: Home / Self Care | Payer: 59 | Source: Ambulatory Visit | Attending: Pulmonary Disease | Admitting: Pulmonary Disease

## 2017-03-24 ENCOUNTER — Other Ambulatory Visit: Payer: Self-pay | Admitting: Emergency Medicine

## 2017-03-24 VITALS — Wt 126.5 lb

## 2017-03-24 DIAGNOSIS — J441 Chronic obstructive pulmonary disease with (acute) exacerbation: Secondary | ICD-10-CM | POA: Diagnosis not present

## 2017-03-24 NOTE — Progress Notes (Signed)
Daily Session Note  Patient Details  Name: Kim [name] MRN: [id number] Date of Birth: 1955/07/14 Referring Provider:     Pulmonary Rehab Walk Test from 02/10/2017 in [place]  Referring Provider  Dr. Ashok Cordia      Encounter Date: 03/24/2017  Check In: Session Check In - 03/24/17 1442      Check-In   Location  [facility]    Staff Present  Rosebud Poles, RN, BSN;Molly diVincenzo, MS, ACSM RCEP, Exercise Physiologist;Aengus Sauceda Ysidro Evert, RN;Portia Rollene Rotunda, RN, BSN    Supervising physician immediately available to respond to emergencies  [facility] immediately available    Physician(s)  Dr. Eliseo Squires    Medication changes reported      No    Fall or balance concerns reported     No    Tobacco Cessation  No Change    Warm-up and Cool-down  Performed as group-led instruction    Resistance Training Performed  Yes    VAD Patient?  No      Pain Assessment   Currently in Pain?  No/denies    Multiple Pain Sites  No       Capillary Blood Glucose: No results found for this or any previous visit (from the past 24 hour(s)).  Exercise Prescription Changes - 03/24/17 1400      Response to Exercise   Blood Pressure (Admit)  120/60    Blood Pressure (Exercise)  132/60    Blood Pressure (Exit)  96/54    Heart Rate (Admit)  67 bpm    Heart Rate (Exercise)  91 bpm    Heart Rate (Exit)  77 bpm    Oxygen Saturation (Admit)  96 %    Oxygen Saturation (Exercise)  94 %    Oxygen Saturation (Exit)  94 %    Rating of Perceived Exertion (Exercise)  13    Perceived Dyspnea (Exercise)  1    Duration  Progress to 45 minutes of aerobic exercise without signs/symptoms of physical distress    Intensity  THRR unchanged      Progression   Progression  Continue to progress workloads to maintain intensity without signs/symptoms of physical distress.      Resistance Training   Training Prescription  Yes    Weight  orange bands    Reps  10-15    Time  10  Minutes      Oxygen   Oxygen  Continuous    Liters  2      Bike   Level  0.8    Minutes  17      NuStep   Level  3    Minutes  17    METs  2.1      Track   Laps  16    Minutes  17       Social History   Tobacco Use  Smoking Status Former Smoker  . Packs/day: 2.00  . Years: 38.00  . Pack years: 76.00  . Types: Cigarettes  . Last attempt to quit: 03/07/2014  . Years since quitting: 3.0  Smokeless Tobacco Never Used    Goals Met:  Exercise tolerated well No report of cardiac concerns or symptoms Strength training completed today  Goals Unmet:  Not Applicable  Comments: Service time is from 1030 to 1215    Dr. Rush Farmer is Medical Director for Pulmonary Rehab at [facility].

## 2017-03-26 ENCOUNTER — Encounter (HOSPITAL_COMMUNITY)
Admission: RE | Admit: 2017-03-26 | Discharge: 2017-03-26 | Disposition: A | Discharge status: Home / Self Care | Payer: 59 | Source: Ambulatory Visit | Attending: Pulmonary Disease | Admitting: Pulmonary Disease

## 2017-03-26 DIAGNOSIS — J441 Chronic obstructive pulmonary disease with (acute) exacerbation: Secondary | ICD-10-CM

## 2017-03-26 NOTE — Progress Notes (Signed)
Pulmonary Discharge Individual Treatment Plan  Patient Details  Name: SUNSET JOSHI MRN: [id number] Date of Birth: 10/24/1955 Referring Provider:     Pulmonary Rehab Walk Test from 02/10/2017 in [place]  Referring Provider  Dr. Ashok Cordia      Initial Encounter Date:    Pulmonary Rehab Walk Test from 02/10/2017 in [place]  Date  02/10/17  Referring Provider  Dr. Ashok Cordia      Visit Diagnosis: Chronic obstructive pulmonary disease with (acute) exacerbation ([place])  Patient's Home Medications on Admission:   Current Outpatient Medications:  .  albuterol (PROVENTIL) (2.5 MG/3ML) 0.083% nebulizer solution, Take 3 mLs (2.5 mg total) by nebulization every 6 (six) hours as needed for wheezing or shortness of breath. DX: J44.1, Disp: 75 mL, Rfl: 3 .  ALPRAZolam (XANAX) 0.5 MG tablet, TAKE 1/2 TO 1 TABLET BY MOUTH 3 TIMES A DAY, Disp: 90 tablet, Rfl: 0 .  aspirin EC 81 MG EC tablet, Take 1 tablet (81 mg total) by mouth daily., Disp: , Rfl:  .  Cholecalciferol (VITAMIN D3) 1000 units CAPS, Take 1 capsule (1,000 Units total) by mouth 2 (two) times daily., Disp: 60 capsule, Rfl: 6 .  furosemide (LASIX) 40 MG tablet, Take 1 tablet (40 mg total) daily as needed by mouth (for weight gain of 3 lbs)., Disp: 30 tablet, Rfl: 6 .  HYDROcodone-acetaminophen (NORCO) 7.5-325 MG tablet, TK 1 T PO Q 4 H PRN FOR MODERATE PAIN, Disp: 90 tablet, Rfl: 0 .  ipratropium-albuterol (DUONEB) 0.5-2.5 (3) MG/3ML SOLN, Take 3 mLs by nebulization every 4 (four) hours as needed (cough)., Disp: 120 mL, Rfl: 3 .  metoprolol tartrate (LOPRESSOR) 25 MG tablet, Take 0.5 tablets (12.5 mg total) by mouth 2 (two) times daily., Disp: 30 tablet, Rfl: 2 .  nitroGLYCERIN (NITROSTAT) 0.4 MG SL tablet, Place 1 tablet (0.4 mg total) under the tongue every 5 (five) minutes as needed for chest pain., Disp: 25 tablet, Rfl: 3 .  PAXIL CR 25 MG 24 hr tablet, TAKE 1 TABLET BY MOUTH   EVERY MORNING, Disp: 90 tablet, Rfl: 1 .  pravastatin (PRAVACHOL) 20 MG tablet, Take 1 tablet (20 mg total) by mouth every evening., Disp: 30 tablet, Rfl: 11 .  Spacer/Aero Chamber Mouthpiece MISC, 1 Device by Does not apply route as directed., Disp: 1 each, Rfl: 0 .  SYMBICORT 160-4.5 MCG/ACT inhaler, INHALE 2 PUFFS BY MOUTH TWICE A DAY, Disp: 10.2 g, Rfl: 5 .  Tiotropium Bromide Monohydrate (SPIRIVA RESPIMAT) 2.5 MCG/ACT AERS, Inhale 2 puffs into the lungs daily. (Patient taking differently: Inhale 1 puff into the lungs daily. ), Disp: 1 Inhaler, Rfl: 0 .  Tiotropium Bromide-Olodaterol (STIOLTO RESPIMAT) 2.5-2.5 MCG/ACT AERS, Inhale 2 puffs into the lungs daily., Disp: 1 Inhaler, Rfl: 0 .  VENTOLIN HFA 108 (90 Base) MCG/ACT inhaler, INHALE 2 PUFFS BY MOUTH INTO THE LUNGS EVERY 4 HOURS AS NEEDED FOR WHEEZING OR SHORTNESS OF BREATH, Disp: 18 g, Rfl: 0 .  vitamin B-12 (CYANOCOBALAMIN) 500 MCG tablet, Take 500 mcg by mouth 2 (two) times daily., Disp: , Rfl:   Past Medical History: Past Medical History:  Diagnosis Date  . C8 RADICULOPATHY 06/05/2009  . COPD 08/31/2007  . DEPRESSION 11/19/2006  . DYSLIPIDEMIA 03/12/2009  . MI (mitral incompetence)   . 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 OSTEOARTHRITIS 06/05/2009  . PARESTHESIA 08/24/2007  . TOBACCO ABUSE 01/25/2010    Tobacco Use: Social History   Tobacco Use  Smoking Status Former  Smoker  . Packs/day: 2.00  . Years: 38.00  . Pack years: 76.00  . Types: Cigarettes  . Last attempt to quit: 03/07/2014  . Years since quitting: 3.0  Smokeless Tobacco Never Used    Labs: Recent Review Flowsheet Data    Labs for ITP Cardiac and Pulmonary Rehab Latest Ref Rng & Units 07/06/2015 07/07/2015 03/07/2016 12/03/2016 01/14/2017   Cholestrol 0 - 200 mg/dL - 216(H) 182 - -   LDLCALC 0 - 99 mg/dL - 95 113(H) - -   LDLDIRECT mg/dL - - - - -   HDL >39.00 mg/dL - 79 38.70(L) - -   Trlycerides 0.0 - 149.0 mg/dL - 211(H) 151.0(H) - -   PHART 7.350 - 7.450 7.401 - -  7.317(L) -   PCO2ART 32.0 - 48.0 mmHg 36.6 - - 59.4(H) -   HCO3 20.0 - 28.0 mmol/L 22.7 - - 30.6(H) 32.9(H)   TCO2 22 - 32 mmol/L 24 - - 32 34(H)   ACIDBASEDEF 0.0 - 2.0 mmol/L 2.0 - - - -   O2SAT % 89.0 - - 100.0 75.0      Capillary Blood Glucose: Lab Results  Component Value Date   GLUCAP 83 12/05/2016   GLUCAP 117 (H) 07/08/2015   GLUCAP 85 07/08/2015   GLUCAP 107 (H) 07/07/2015   GLUCAP 98 07/07/2015     Pulmonary Assessment Scores: Pulmonary Assessment Scores    Row Name 02/11/17 1014 02/12/17 0722 03/26/17 1433     ADL UCSD   ADL Phase  Entry  Entry  Exit   SOB Score total  67  -  -     CAT Score   CAT Score  29 Entry  -  -     mMRC Score   mMRC Score  -  3  0      Pulmonary Function Assessment: Pulmonary Function Assessment - 02/09/17 1020      Post Bronchodilator Spirometry Results   FEV1%  43 %    FEV1/FVC Ratio  48      Breath   Bilateral Breath Sounds  Clear    Shortness of Breath  Yes;Panic with Shortness of Breath;Limiting activity;Fear of Shortness of Breath       Exercise Target Goals:    Exercise Program Goal: Individual exercise prescription set with THRR, safety & activity barriers. Participant demonstrates ability to understand and report RPE using BORG scale, to self-measure pulse accurately, and to acknowledge the importance of the exercise prescription.  Exercise Prescription Goal: Starting with aerobic activity 30 plus minutes a day, 3 days per week for initial exercise prescription. Provide home exercise prescription and guidelines that participant acknowledges understanding prior to discharge.  Activity Barriers & Risk Stratification: Activity Barriers & Cardiac Risk Stratification - 02/09/17 1034      Activity Barriers & Cardiac Risk Stratification   Activity Barriers  Deconditioning;Shortness of Breath;Muscular Weakness       6 Minute Walk: 6 Minute Walk    Row Name 02/12/17 0719 03/26/17 1427       6 Minute Walk    Phase  Initial  Discharge    Distance  1300 feet  1700 feet    Distance Feet Change  -  400 ft    Walk Time  6 minutes  6 minutes    # of Rest Breaks  0  0    MPH  2.46  3.21    METS  2.91  3.45    RPE  12  12      Perceived Dyspnea   1  1    Symptoms  No  No    Resting HR  98 bpm  94 bpm    Resting BP  118/75  110/72    Resting Oxygen Saturation   93 %  98 %    Exercise Oxygen Saturation  during 6 min walk  85 %  95 %    Max Ex. HR  146 bpm  94 bpm    Max Ex. BP  148/80  154/77      Interval HR   1 Minute HR  105  65    2 Minute HR  105  81    3 Minute HR  106  88    4 Minute HR  107  93    5 Minute HR  146  94    6 Minute HR  146  94    2 Minute Post HR  104  86    Interval Heart Rate?  Yes  Yes      Interval Oxygen   Interval Oxygen?  Yes  Yes    Baseline Oxygen Saturation %  93 %  98 %    1 Minute Oxygen Saturation %  93 %  98 %    1 Minute Liters of Oxygen  0 L  2 L    2 Minute Oxygen Saturation %  92 %  98 %    2 Minute Liters of Oxygen  0 L  2 L    3 Minute Oxygen Saturation %  87 %  96 %    3 Minute Liters of Oxygen  0 L  2 L    4 Minute Oxygen Saturation %  86 %  95 %    4 Minute Liters of Oxygen  0 L  2 L    5 Minute Oxygen Saturation %  85 %  95 %    5 Minute Liters of Oxygen  0 L  2 L    6 Minute Oxygen Saturation %  85 %  95 %    6 Minute Liters of Oxygen  0 L  2 L    2 Minute Post Oxygen Saturation %  93 %  96 %    2 Minute Post Liters of Oxygen  0 L  2 L       Oxygen Initial Assessment: Oxygen Initial Assessment - 02/12/17 0721      Initial 6 min Walk   Oxygen Used  None      Program Oxygen Prescription   Program Oxygen Prescription  Continuous;E-Tanks    Liters per minute  2    Comments  Start at 2 liters and titrate from there since patient desaturated to 85% on their 6MWT       Oxygen Re-Evaluation: Oxygen Re-Evaluation    Row Name 02/17/17 1604 03/09/17 1745 03/26/17 1647         Program Oxygen Prescription   Program Oxygen  Prescription  Continuous;E-Tanks  Continuous;E-Tanks  Continuous;E-Tanks     Liters per minute  2  2  2     Comments  Start at 2 liters and titrate from there since patient desaturated to 85% on their 6MWT  patient continues to only need 2 liters of oxygen for exertion  -       Home Oxygen   Home Oxygen Device  Portable Concentrator;Home Concentrator  Portable Concentrator;Home Concentrator  Portable Concentrator;Home Concentrator     Sleep Oxygen Prescription    Continuous  Continuous  Continuous     Liters per minute  2  2  2     Home Exercise Oxygen Prescription  Continuous  Continuous  Continuous     Liters per minute  2  2  2     Home at Rest Exercise Oxygen Prescription  None  None  None     Compliance with Home Oxygen Use  -  Yes  -       Goals/Expected Outcomes   Short Term Goals  To learn and exhibit compliance with exercise, home and travel O2 prescription  To learn and exhibit compliance with exercise, home and travel O2 prescription;To learn and understand importance of monitoring SPO2 with pulse oximeter and demonstrate accurate use of the pulse oximeter.;To learn and understand importance of maintaining oxygen saturations>88%;To learn and demonstrate proper pursed lip breathing techniques or other breathing techniques.;To learn and demonstrate proper use of respiratory medications  To learn and exhibit compliance with exercise, home and travel O2 prescription;To learn and understand importance of monitoring SPO2 with pulse oximeter and demonstrate accurate use of the pulse oximeter.;To learn and understand importance of maintaining oxygen saturations>88%;To learn and demonstrate proper pursed lip breathing techniques or other breathing techniques.;To learn and demonstrate proper use of respiratory medications     Long  Term Goals  Exhibits compliance with exercise, home and travel O2 prescription  Exhibits compliance with exercise, home and travel O2 prescription;Verbalizes importance  of monitoring SPO2 with pulse oximeter and return demonstration;Maintenance of O2 saturations>88%;Exhibits proper breathing techniques, such as pursed lip breathing or other method taught during program session;Compliance with respiratory medication;Demonstrates proper use of MDI's  Exhibits compliance with exercise, home and travel O2 prescription;Verbalizes importance of monitoring SPO2 with pulse oximeter and return demonstration;Maintenance of O2 saturations>88%;Exhibits proper breathing techniques, such as pursed lip breathing or other method taught during program session;Compliance with respiratory medication;Demonstrates proper use of MDI's     Comments  -  -  goal met at discharge        Oxygen Discharge (Final Oxygen Re-Evaluation): Oxygen Re-Evaluation - 03/26/17 1647      Program Oxygen Prescription   Program Oxygen Prescription  Continuous;E-Tanks    Liters per minute  2      Home Oxygen   Home Oxygen Device  Portable Concentrator;Home Concentrator    Sleep Oxygen Prescription  Continuous    Liters per minute  2    Home Exercise Oxygen Prescription  Continuous    Liters per minute  2    Home at Rest Exercise Oxygen Prescription  None      Goals/Expected Outcomes   Short Term Goals  To learn and exhibit compliance with exercise, home and travel O2 prescription;To learn and understand importance of monitoring SPO2 with pulse oximeter and demonstrate accurate use of the pulse oximeter.;To learn and understand importance of maintaining oxygen saturations>88%;To learn and demonstrate proper pursed lip breathing techniques or other breathing techniques.;To learn and demonstrate proper use of respiratory medications    Long  Term Goals  Exhibits compliance with exercise, home and travel O2 prescription;Verbalizes importance of monitoring SPO2 with pulse oximeter and return demonstration;Maintenance of O2 saturations>88%;Exhibits proper breathing techniques, such as pursed lip breathing  or other method taught during program session;Compliance with respiratory medication;Demonstrates proper use of MDI's    Comments  goal met at discharge       Initial Exercise Prescription: Initial Exercise Prescription - 02/12/17 0700      Date of Initial Exercise   RX and Referring Provider   Date  02/10/17    Referring Provider  Dr. Ashok Cordia      Oxygen   Oxygen  Continuous    Liters  2      Bike   Level  0.4    Minutes  17      NuStep   Level  2    Minutes  17    METs  1.4      Track   Laps  10    Minutes  17      Prescription Details   Frequency (times per week)  2    Duration  Progress to 45 minutes of aerobic exercise without signs/symptoms of physical distress      Intensity   THRR 40-80% of Max Heartrate  63-126    Ratings of Perceived Exertion  11-13    Perceived Dyspnea  0-4      Progression   Progression  Continue progressive overload as per policy without signs/symptoms or physical distress.      Resistance Training   Training Prescription  Yes    Weight  orange bands    Reps  10-15       Perform Capillary Blood Glucose checks as needed.  Exercise Prescription Changes: Exercise Prescription Changes    Row Name 02/17/17 1200 02/19/17 1237 02/24/17 1223 03/05/17 1300 03/10/17 1221     Response to Exercise   Blood Pressure (Admit)  102/64  126/60  104/60  116/50  112/64   Blood Pressure (Exercise)  130/70  130/56  130/64  122/72  118/60   Blood Pressure (Exit)  100/60  130/70  108/60  114/58  100/60   Heart Rate (Admit)  75 bpm  72 bpm  65 bpm  65 bpm  64 bpm   Heart Rate (Exercise)  92 bpm  78 bpm  78 bpm  78 bpm  85 bpm   Heart Rate (Exit)  82 bpm  72 bpm  65 bpm  66 bpm  69 bpm   Oxygen Saturation (Admit)  96 %  94 %  98 %  94 %  97 %   Oxygen Saturation (Exercise)  96 %  97 %  97 %  96 %  96 %   Oxygen Saturation (Exit)  95 %  98 %  95 %  97 %  95 %   Rating of Perceived Exertion (Exercise)  _0 Perceived Dyspnea (Exercise)   _1 0  1   Symptoms  -  -  -  -       Duration  Progress to 45 minutes of aerobic exercise without signs/symptoms of physical distress  Progress to 45 minutes of aerobic exercise without signs/symptoms of physical distress  Progress to 45 minutes of aerobic exercise without signs/symptoms of physical distress  Progress to 45 minutes of aerobic exercise without signs/symptoms of physical distress  Progress to 45 minutes of aerobic exercise without signs/symptoms of physical distress   Intensity  Other (comment) 40-80% of HRR  THRR unchanged  THRR unchanged  THRR unchanged  THRR unchanged     Progression   Progression  Continue to progress workloads to maintain intensity without signs/symptoms of physical distress.  Continue to progress workloads to maintain intensity without signs/symptoms of physical distress.  Continue to progress workloads to maintain intensity without signs/symptoms of physical distress.  Continue to progress workloads to maintain intensity  without signs/symptoms of physical distress.  Continue to progress workloads to maintain intensity without signs/symptoms of physical distress.     Resistance Training   Training Prescription  Yes  Yes  Yes  Yes  Yes   Weight  orange bands  orange bands  orange bands  orange bands  orange bands   Reps  10-15  10-15  10-15  10-15  10-15   Time  10 Minutes  10 Minutes  10 Minutes  10 Minutes  10 Minutes     Oxygen   Oxygen  Continuous  Continuous  Continuous  Continuous  Continuous   Liters  _0 Bike   Level  0.4  -  0.4  0.4  0.8   Minutes  17  -  _1 NuStep   Level  _2 -  3   Minutes  _3 -  17   METs  1.9  1.8  1.9  -  1.9     Track   Laps  _4 Minutes  _5 Home Exercise Plan   Plans to continue exercise at  -  -  -  Home (comment)  Home (comment)   Frequency  -  -  -  Add 2 additional days to program exercise sessions.  Add 2 additional days to  program exercise sessions.   Row Name 03/12/17 1200 03/24/17 1400           Response to Exercise   Blood Pressure (Admit)  130/64  120/60      Blood Pressure (Exercise)  138/70  132/60      Blood Pressure (Exit)  100/60  96/54      Heart Rate (Admit)  61 bpm  67 bpm      Heart Rate (Exercise)  80 bpm  91 bpm      Heart Rate (Exit)  67 bpm  77 bpm      Oxygen Saturation (Admit)  93 %  96 %      Oxygen Saturation (Exercise)  95 %  94 %      Oxygen Saturation (Exit)  94 %  94 %      Rating of Perceived Exertion (Exercise)  13  13      Perceived Dyspnea (Exercise)  1  1      Duration  Progress to 45 minutes of aerobic exercise without signs/symptoms of physical distress  Progress to 45 minutes of aerobic exercise without signs/symptoms of physical distress      Intensity  THRR unchanged  THRR unchanged        Progression   Progression  Continue to progress workloads to maintain intensity without signs/symptoms of physical distress.  Continue to progress workloads to maintain intensity without signs/symptoms of physical distress.        Resistance Training   Training Prescription  Yes  Yes      Weight  orange bands  orange bands      Reps  10-15  10-15      Time  10 Minutes  10 Minutes        Oxygen   Oxygen  Continuous  Continuous      Liters  2  2  Bike   Level  0.8  0.8      Minutes  17  17        NuStep   Level  3  3      Minutes  17  17      METs  1.3  2.1        Track   Laps  -  16      Minutes  -  17         Exercise Comments: Exercise Comments    Row Name 03/09/17 0712           Exercise Comments  Home exercise completed          Exercise Goals and Review:   Exercise Goals Re-Evaluation : Exercise Goals Re-Evaluation    Row Name 02/19/17 1005 03/09/17 1622 03/26/17 0718         Exercise Goal Re-Evaluation   Exercise Goals Review  Increase Strength and Stamina;Increase Physical Activity;Able to understand and use Dyspnea scale;Able to  understand and use rate of perceived exertion (RPE) scale;Knowledge and understanding of Target Heart Rate Range (THRR);Understanding of Exercise Prescription  Increase Strength and Stamina;Increase Physical Activity;Able to understand and use Dyspnea scale;Able to understand and use rate of perceived exertion (RPE) scale;Knowledge and understanding of Target Heart Rate Range (THRR);Understanding of Exercise Prescription  Increase Strength and Stamina;Increase Physical Activity;Able to understand and use Dyspnea scale;Able to understand and use rate of perceived exertion (RPE) scale;Knowledge and understanding of Target Heart Rate Range (THRR);Understanding of Exercise Prescription     Comments  Patient has only attended one rehab session. Will cont. to monitor and progress as able.   Patient has only attended 4 sessions. Home exercise completed. Plans on attending maintenance at the end of the year. Will cont. to monitor and progress as able.   Patient has only attended 7 sessions. Will graduate today due to her deductible changing at the beginning of the year. Will cont. to exercise at [employer] with husband.      Expected Outcomes  Through exercise at rehab and at home, patient will increase strength and stamina and also be able to perform ADL's easier.  Through exercise at rehab and at home, patient will increase strength and stamina making ADL's easier to perform. Patient will also have a better understanding of safe exercise and what they are capable to do outside of clinical supervision.  Through exercise at rehab and at home, patient will increase strength and stamina making ADL's easier to perform. Patient will also have a better understanding of safe exercise and what they are capable to do outside of clinical supervision.        Discharge Exercise Prescription (Final Exercise Prescription Changes): Exercise Prescription Changes - 03/24/17 1400      Response to Exercise   Blood Pressure  (Admit)  120/60    Blood Pressure (Exercise)  132/60    Blood Pressure (Exit)  96/54    Heart Rate (Admit)  67 bpm    Heart Rate (Exercise)  91 bpm    Heart Rate (Exit)  77 bpm    Oxygen Saturation (Admit)  96 %    Oxygen Saturation (Exercise)  94 %    Oxygen Saturation (Exit)  94 %    Rating of Perceived Exertion (Exercise)  13    Perceived Dyspnea (Exercise)  1    Duration  Progress to 45 minutes of aerobic exercise without signs/symptoms of physical distress    Intensity    THRR unchanged      Progression   Progression  Continue to progress workloads to maintain intensity without signs/symptoms of physical distress.      Resistance Training   Training Prescription  Yes    Weight  orange bands    Reps  10-15    Time  10 Minutes      Oxygen   Oxygen  Continuous    Liters  2      Bike   Level  0.8    Minutes  17      NuStep   Level  3    Minutes  17    METs  2.1      Track   Laps  16    Minutes  17       Nutrition:  Target Goals: Understanding of nutrition guidelines, daily intake of sodium <1559m, cholesterol <2070m calories 30% from fat and 7% or less from saturated fats, daily to have 5 or more servings of fruits and vegetables.  Biometrics: Pre Biometrics - 02/09/17 1035      Pre Biometrics   Grip Strength  25 kg      Post Biometrics - 03/26/17 1432       Post  Biometrics   Grip Strength  28 kg       Nutrition Therapy Plan and Nutrition Goals: Nutrition Therapy & Goals - 03/06/17 1208      Nutrition Therapy   Diet  General, Healthful      Personal Nutrition Goals   Nutrition Goal  Describe the benefit of including fruits, vegetables, whole grains, and low-fat dairy products in a healthy meal plan.      Intervention Plan   Intervention  Prescribe, educate and counsel regarding individualized specific dietary modifications aiming towards targeted core components such as weight, hypertension, lipid management, diabetes, heart failure and other  comorbidities.    Expected Outcomes  Short Term Goal: Understand basic principles of dietary content, such as calories, fat, sodium, cholesterol and nutrients.;Long Term Goal: Adherence to prescribed nutrition plan.       Nutrition Discharge: Rate Your Plate Scores: Nutrition Assessments - 03/06/17 1208      Rate Your Plate Scores   Pre Score  47       Nutrition Goals Re-Evaluation:   Nutrition Goals Discharge (Final Nutrition Goals Re-Evaluation):   Psychosocial: Target Goals: Acknowledge presence or absence of significant depression and/or stress, maximize coping skills, provide positive support system. Participant is able to verbalize types and ability to use techniques and skills needed for reducing stress and depression.  Initial Review & Psychosocial Screening: Initial Psych Review & Screening - 02/09/17 1359      Initial Review   Current issues with  None Identified      Family Dynamics   Good Support System?  Yes      Barriers   Psychosocial barriers to participate in program  There are no identifiable barriers or psychosocial needs.      Screening Interventions   Interventions  Encouraged to exercise       Quality of Life Scores:   PHQ-9: Recent Review Flowsheet Data    [facility] 03/26/2017 02/09/2017   Decreased Interest 1 0   Down, Depressed, Hopeless 1 3   PHQ - 2 Score 2 3   Altered sleeping 1 0   Tired, decreased energy 1 1   Change in appetite 0 0   Feeling bad or failure about yourself  2 2  Trouble concentrating 0 0   Moving slowly or fidgety/restless 0 1   Suicidal thoughts 2 2   PHQ-9 Score 8 9   Difficult doing work/chores Not difficult at all Not difficult at all     Interpretation of Total Score  Total Score Depression Severity:  1-4 = Minimal depression, 5-9 = Mild depression, 10-14 = Moderate depression, 15-19 = Moderately severe depression, 20-27 = Severe depression   Psychosocial Evaluation and  Intervention: Psychosocial Evaluation - 02/09/17 1400      Psychosocial Evaluation & Interventions   Interventions  Encouraged to exercise with the program and follow exercise prescription    Continue Psychosocial Services   No Follow up required       Psychosocial Re-Evaluation: Psychosocial Re-Evaluation    [place] Name 02/17/17 1608 03/09/17 1751 03/26/17 1649         Psychosocial Re-Evaluation   Current issues with  None Identified  None Identified  None Identified     Comments  -  -  no psychosocial barriers identified at discharge     Expected Outcomes  patient will remain free from psychosocial barriers to participation in pulmonary rehab  patient will remain free from psychosocial barriers to participation in pulmonary rehab  patient will remain free from psychosocial barriers to participation in pulmonary rehab     Interventions  Encouraged to attend Cardiac Rehabilitation for the exercise  Encouraged to attend Cardiac Rehabilitation for the exercise  -     Continue Psychosocial Services   No Follow up required  No Follow up required  -        Psychosocial Discharge (Final Psychosocial Re-Evaluation): Psychosocial Re-Evaluation - 03/26/17 1649      Psychosocial Re-Evaluation   Current issues with  None Identified    Comments  no psychosocial barriers identified at discharge    Expected Outcomes  patient will remain free from psychosocial barriers to participation in pulmonary rehab       Education: Education Goals: Education classes will be provided on a weekly basis, covering required topics. Participant will state understanding/return demonstration of topics presented.  Learning Barriers/Preferences: Learning Barriers/Preferences - 02/09/17 1018      Learning Barriers/Preferences   Learning Barriers  None    Learning Preferences  Individual Instruction       Education Topics: Risk Factor Reduction:  -Group instruction that is supported by a PowerPoint  presentation. Instructor discusses the definition of a risk factor, different risk factors for pulmonary disease, and how the heart and lungs work together.     Nutrition for Pulmonary Patient:  -Group instruction provided by PowerPoint slides, verbal discussion, and written materials to support subject matter. The instructor gives an explanation and review of healthy diet recommendations, which includes a discussion on weight management, recommendations for fruit and vegetable consumption, as well as protein, fluid, caffeine, fiber, sodium, sugar, and alcohol. Tips for eating when patients are short of breath are discussed.   Pursed Lip Breathing:  -Group instruction that is supported by demonstration and informational handouts. Instructor discusses the benefits of pursed lip and diaphragmatic breathing and detailed demonstration on how to preform both.     Oxygen Safety:  -Group instruction provided by PowerPoint, verbal discussion, and written material to support subject matter. There is an overview of "What is Oxygen" and "Why do we need it".  Instructor also reviews how to create a safe environment for oxygen use, the importance of using oxygen as prescribed, and the risks of noncompliance. There is a  brief discussion on traveling with oxygen and resources the patient may utilize.   Oxygen Equipment:  -Group instruction provided by Home Health Staff utilizing handouts, written materials, and equipment demonstrations.   PULMONARY REHAB CHRONIC OBSTRUCTIVE PULMONARY DISEASE from 03/26/2017 in [facility]  Date  03/05/17  Educator  George[employer]  Instruction Review Code  2- meets goals/outcomes      Signs and Symptoms:  -Group instruction provided by written material and verbal discussion to support subject matter. Warning signs and symptoms of infection, stroke, and heart attack are reviewed and when to call the physician/911 reinforced. Tips for preventing  the spread of infection discussed.   Advanced Directives:  -Group instruction provided by verbal instruction and written material to support subject matter. Instructor reviews Advanced Directive laws and proper instruction for filling out document.   Pulmonary Video:  -Group video education that reviews the importance of medication and oxygen compliance, exercise, good nutrition, pulmonary hygiene, and pursed lip and diaphragmatic breathing for the pulmonary patient.   Exercise for the Pulmonary Patient:  -Group instruction that is supported by a PowerPoint presentation. Instructor discusses benefits of exercise, core components of exercise, frequency, duration, and intensity of an exercise routine, importance of utilizing pulse oximetry during exercise, safety while exercising, and options of places to exercise outside of rehab.     PULMONARY REHAB CHRONIC OBSTRUCTIVE PULMONARY DISEASE from 03/26/2017 in [facility]  Date  02/19/17  Educator  Molly  Instruction Review Code  2- meets goals/outcomes      Pulmonary Medications:  -Verbally interactive group education provided by instructor with focus on inhaled medications and proper administration.   Anatomy and Physiology of the Respiratory System and Intimacy:  -Group instruction provided by PowerPoint, verbal discussion, and written material to support subject matter. Instructor reviews respiratory cycle and anatomical components of the respiratory system and their functions. Instructor also reviews differences in obstructive and restrictive respiratory diseases with examples of each. Intimacy, Sex, and Sexuality differences are reviewed with a discussion on how relationships can change when diagnosed with pulmonary disease. Common sexual concerns are reviewed.   PULMONARY REHAB CHRONIC OBSTRUCTIVE PULMONARY DISEASE from 03/26/2017 in [facility]  Date  03/26/17  Educator  RN   Instruction Review Code  2- meets goals/outcomes      MD DAY -A group question and answer session with a medical doctor that allows participants to ask questions that relate to their pulmonary disease state.   OTHER EDUCATION -Group or individual verbal, written, or video instructions that support the educational goals of the pulmonary rehab program.   Knowledge Questionnaire Score: Knowledge Questionnaire Score - 02/11/17 1014      Knowledge Questionnaire Score   Pre Score  9/13       Core Components/Risk Factors/Patient Goals at Admission: Personal Goals and Risk Factors at Admission - 02/09/17 1400      Core Components/Risk Factors/Patient Goals on Admission   Improve shortness of breath with ADL's  Yes    Intervention  Provide education, individualized exercise plan and daily activity instruction to help decrease symptoms of SOB with activities of daily living.    Expected Outcomes  Short Term: Achieves a reduction of symptoms when performing activities of daily living.    Develop more efficient breathing techniques such as purse lipped breathing and diaphragmatic breathing; and practicing self-pacing with activity  Yes    Intervention  Provide education, demonstration and support about specific breathing   techniuqes utilized for more efficient breathing. Include techniques such as pursed lipped breathing, diaphragmatic breathing and self-pacing activity.    Expected Outcomes  Short Term: Participant will be able to demonstrate and use breathing techniques as needed throughout daily activities.    Increase knowledge of respiratory medications and ability to use respiratory devices properly   Yes    Intervention  Provide education and demonstration as needed of appropriate use of medications, inhalers, and oxygen therapy.    Expected Outcomes  Short Term: Achieves understanding of medications use. Understands that oxygen is a medication prescribed by physician. Demonstrates  appropriate use of inhaler and oxygen therapy.       Core Components/Risk Factors/Patient Goals Review:  Goals and Risk Factor Review    Row Name 02/17/17 1606 03/09/17 1746 03/26/17 1648         Core Components/Risk Factors/Patient Goals Review   Personal Goals Review  Improve shortness of breath with ADL's;Develop more efficient breathing techniques such as purse lipped breathing and diaphragmatic breathing and practicing self-pacing with activity.;Increase knowledge of respiratory medications and ability to use respiratory devices properly.  Improve shortness of breath with ADL's;Develop more efficient breathing techniques such as purse lipped breathing and diaphragmatic breathing and practicing self-pacing with activity.;Increase knowledge of respiratory medications and ability to use respiratory devices properly.  Improve shortness of breath with ADL's;Develop more efficient breathing techniques such as purse lipped breathing and diaphragmatic breathing and practicing self-pacing with activity.;Increase knowledge of respiratory medications and ability to use respiratory devices properly.     Review  no progress towards goals related to only attending 1 session since admission  patient progressing towards rehab goals. Patient only staying through December r/t financial reasons and insurance coverage and has been encouraged to enroll in Maintenance. Observed utilizing PLB, states some improvement in her SOB with ADLs. Fast tracked through alot of education with patient related to stay in pulmonry rehab limited.  goals met at discharge     Expected Outcomes  see admission outcomes  see admission outcomes  -        Core Components/Risk Factors/Patient Goals at Discharge (Final Review):  Goals and Risk Factor Review - 03/26/17 1648      Core Components/Risk Factors/Patient Goals Review   Personal Goals Review  Improve shortness of breath with ADL's;Develop more efficient breathing techniques  such as purse lipped breathing and diaphragmatic breathing and practicing self-pacing with activity.;Increase knowledge of respiratory medications and ability to use respiratory devices properly.    Review  goals met at discharge       ITP Comments:   Comments: Patient has been discharge from pulmonary rehab and plans to continue her bi-weekly exercise at the local gym.

## 2017-03-27 ENCOUNTER — Ambulatory Visit (INDEPENDENT_AMBULATORY_CARE_PROVIDER_SITE_OTHER): Payer: 59

## 2017-03-27 DIAGNOSIS — R0989 Other specified symptoms and signs involving the circulatory and respiratory systems: Secondary | ICD-10-CM

## 2017-04-02 ENCOUNTER — Encounter (HOSPITAL_COMMUNITY): Payer: 59

## 2017-04-05 ENCOUNTER — Other Ambulatory Visit: Payer: Self-pay | Admitting: Internal Medicine

## 2017-04-07 HISTORY — PX: SALPINGOOPHORECTOMY: SHX82

## 2017-04-07 HISTORY — PX: EXCISION MASS ABDOMINAL: SHX6701

## 2017-04-16 ENCOUNTER — Telehealth: Payer: Self-pay | Admitting: *Deleted

## 2017-04-16 MED ORDER — LISINOPRIL 2.5 MG PO TABS: 2.5000 mg | ORAL_TABLET | Freq: Every day | ORAL | 3 refills | Status: DC

## 2017-04-16 NOTE — Telephone Encounter (Signed)
Dr. Angelena Form reviewed BP monitor results and would like pt to resume Lisinopril 2.5 mg by mouth daily. Office visit in 3-4 weeks.  I spoke with pt and reviewed results and medication instructions with her.  Appt made for pt to see B. Rosita Fire, [place] on 05/14/17 at 7:30.  Pt has lisinopril at home and does not need new prescription sent in at this time.

## 2017-05-01 ENCOUNTER — Encounter (HOSPITAL_COMMUNITY): Payer: Self-pay

## 2017-05-01 DIAGNOSIS — J441 Chronic obstructive pulmonary disease with (acute) exacerbation: Secondary | ICD-10-CM

## 2017-05-14 ENCOUNTER — Ambulatory Visit: Payer: 59 | Admitting: Cardiology

## 2017-06-04 ENCOUNTER — Encounter: Payer: Self-pay | Admitting: Pulmonary Disease

## 2017-06-04 ENCOUNTER — Ambulatory Visit: Payer: 59 | Admitting: Pulmonary Disease

## 2017-06-04 ENCOUNTER — Ambulatory Visit (INDEPENDENT_AMBULATORY_CARE_PROVIDER_SITE_OTHER)
Admission: RE | Admit: 2017-06-04 | Discharge: 2017-06-04 | Disposition: A | Discharge status: Home / Self Care | Payer: 59 | Source: Ambulatory Visit | Attending: Pulmonary Disease | Admitting: Pulmonary Disease

## 2017-06-04 DIAGNOSIS — R071 Chest pain on breathing: Secondary | ICD-10-CM

## 2017-06-04 DIAGNOSIS — J9611 Chronic respiratory failure with hypoxia: Secondary | ICD-10-CM | POA: Diagnosis not present

## 2017-06-04 DIAGNOSIS — J449 Chronic obstructive pulmonary disease, unspecified: Secondary | ICD-10-CM | POA: Diagnosis not present

## 2017-06-04 MED ORDER — TIOTROPIUM BROMIDE MONOHYDRATE 2.5 MCG/ACT IN AERS: 2.0000 | INHALATION_SPRAY | Freq: Every day | RESPIRATORY_TRACT | 5 refills | Status: DC

## 2017-06-04 NOTE — Assessment & Plan Note (Signed)
Refills on Spiriva. Ct symbicort

## 2017-06-04 NOTE — Progress Notes (Signed)
   Subjective:    Patient ID: Kim [name], female    DOB: 27-Apr-1955, 62 y.o.   MRN: [id number]  HPI  62 y.o. 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 Patient has had multiple visits and hospitalization for exacerbation She is seeing Dr. Lamonte Sakai and Dr. Ashok Cordia in the past and presents to establish with me.  Last visit was 01/2017.  She reports that her dyspnea is much improved.  She has completed pulmonary rehab where she is 2 L of oxygen she is on oxygen concentrator and her energy portable concentrator which she rarely uses this now.  When she gets out of breath she just waits until she recovers, and this happens especially when she is climbing stairs, not on level ground.  She developed a cough with lisinopril and stopped taking this.  She developed right-sided infrascapular chest pain of sudden onset about 3 weeks ago, worse with deep inspiration.   She is tearful today because her brother has just been diagnosed with metastatic renal cancer. She uses DuoNeb's and has stopped taking Spiriva.  Compliant with Symbicort Significant tests/ events reviewed  Chest x-ray from 01/2017 was reviewed which does not show any infiltrates or effusions. 10/2016 CT chest showed subcentimeter nodules which have been stable since 2017, right adrenal nodule was stable since 2015 and considered benign  Review of Systems neg for any significant sore throat, dysphagia, itching, sneezing, nasal congestion or excess/ purulent secretions, fever, chills, sweats, unintended wt loss, pleuritic or exertional cp, hempoptysis, orthopnea pnd or change in chronic leg swelling. Also denies presyncope, palpitations, heartburn, abdominal pain, nausea, vomiting, diarrhea or change in bowel or urinary habits, dysuria,hematuria, rash, arthralgias, visual complaints, headache, numbness weakness or ataxia.     Objective:   Physical Exam  Gen. Pleasant, well-nourished, in no distress ENT - no thrush, no post nasal  drip Neck: No JVD, no thyromegaly, no carotid bruits Lungs: no use of accessory muscles, no dullness to percussion, clear without rales or rhonchi , reproducible tenderness right infrascapular area Cardiovascular: Rhythm regular, heart sounds  normal, no murmurs or gallops, no peripheral edema Musculoskeletal: No deformities, no cyanosis or clubbing        Assessment & Plan:

## 2017-06-04 NOTE — Assessment & Plan Note (Signed)
Ambulatory saturation to see how long it takes you to recover

## 2017-06-04 NOTE — Patient Instructions (Signed)
Refills on Spiriva. Ambulatory saturation to see how long it takes you to recover Okay to stop lisinopril.  Chest x-ray today. If pain persists, okay to use Motrin or Aleve after food

## 2017-06-04 NOTE — Assessment & Plan Note (Signed)
Okay to stop lisinopril. Probably musculoskeletal  Chest x-ray today. If pain persists, okay to use Motrin or Aleve after food

## 2017-06-04 NOTE — Addendum Note (Signed)
Addended by: Valerie Salts on: 06/04/2017 09:53 AM   Modules accepted: Orders

## 2017-06-13 ENCOUNTER — Ambulatory Visit: Payer: 59 | Admitting: Internal Medicine

## 2017-06-13 ENCOUNTER — Encounter: Payer: Self-pay | Admitting: Internal Medicine

## 2017-06-13 VITALS — BP 132/86 | HR 73 | Temp 98.1°F | Ht 63.0 in | Wt 130.0 lb

## 2017-06-13 DIAGNOSIS — R739 Hyperglycemia, unspecified: Secondary | ICD-10-CM

## 2017-06-13 DIAGNOSIS — J441 Chronic obstructive pulmonary disease with (acute) exacerbation: Secondary | ICD-10-CM | POA: Diagnosis not present

## 2017-06-13 DIAGNOSIS — R6889 Other general symptoms and signs: Secondary | ICD-10-CM | POA: Diagnosis not present

## 2017-06-13 LAB — POC INFLUENZA A&B (BINAX/QUICKVUE)
Influenza A, POC: NEGATIVE
Influenza B, POC: NEGATIVE

## 2017-06-13 MED ORDER — PREDNISONE 10 MG PO TABS: ORAL_TABLET | ORAL | 0 refills | Status: DC

## 2017-06-13 MED ORDER — HYDROCODONE-HOMATROPINE 5-1.5 MG/5ML PO SYRP: 5.0000 mL | ORAL_SOLUTION | Freq: Four times a day (QID) | ORAL | 0 refills | Status: AC | PRN

## 2017-06-13 MED ORDER — OSELTAMIVIR PHOSPHATE 75 MG PO CAPS: 75.0000 mg | ORAL_CAPSULE | Freq: Two times a day (BID) | ORAL | 0 refills | Status: DC

## 2017-06-13 MED ORDER — AZITHROMYCIN 250 MG PO TABS: ORAL_TABLET | ORAL | 1 refills | Status: DC

## 2017-06-13 NOTE — Assessment & Plan Note (Signed)
Mild to mod, for antibx course, cough med prn, predpac asd, cont inhalers,  to f/u any worsening symptoms or concerns

## 2017-06-13 NOTE — Assessment & Plan Note (Signed)
stable overall by history and exam, and pt to continue medical treatment as before,  to f/u any worsening symptoms or concerns, pt to call for onset polys or cbg > 200 with tx 

## 2017-06-13 NOTE — Assessment & Plan Note (Signed)
Rapid flu test neg, but cant r/o false neg with obvious prolonged exposure to proven case; for tamilfu course

## 2017-06-13 NOTE — Patient Instructions (Addendum)
Please take all new medication as prescribed - the tamiflu, antibiotic, coiugh medicine if needed and prednisone  Please continue all other medications as before, and refills have been done if requested.  Please have the pharmacy call with any other refills you may need.  Please keep your appointments with your specialists as you may have planned  Good Luck with your brother and his illness.

## 2017-06-13 NOTE — Progress Notes (Signed)
Subjective:    Patient ID: Kim [name], female    DOB: 04/09/1955, 62 y.o.   MRN: [id number]  HPI  Here with acute onset mild to mod 4-5 days ST, HA, general weakness and malaise, with prod cough greenish sputum, but Pt denies chest pain, increased sob or doe, wheezing, orthopnea, PND, increased LE swelling, palpitations, dizziness or syncope, except for onset mild sob and wheezing in last 2 days.  Has been staying overnight several nights in the hospital room with brother in [facility] who had dx of met renal cell ca 2 wks ago with brain met, but now hospd with + "influenza and pneumonia."  Pt has had similar symptoms in past, steroids have been helpful.   Pt denies polydipsia, polyuria Past Medical History:  Diagnosis Date  . C8 RADICULOPATHY 06/05/2009  . COPD 08/31/2007  . DEPRESSION 11/19/2006  . DYSLIPIDEMIA 03/12/2009  . MI (mitral incompetence)   . 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 OSTEOARTHRITIS 06/05/2009  . PARESTHESIA 08/24/2007  . TOBACCO ABUSE 01/25/2010   Past Surgical History:  Procedure Laterality Date  . APPENDECTOMY  1969  . CARDIAC CATHETERIZATION N/A 07/05/2015   Procedure: Left Heart Cath and Coronary Angiography;  Surgeon: Leonie Man, MD;  Location: [facility];  Service: Cardiovascular;  Laterality: N/A;  . CHOLECYSTECTOMY  1989  . collapse lung  1990  . [place]    reports that she quit smoking about 3 years ago. Her smoking use included cigarettes. She has a 76.00 pack-year smoking history. she has never used smokeless tobacco. She reports that she does not drink alcohol or use drugs. family history includes Alcohol abuse in her other; Arthritis in her other; Cancer in her mother and other; Diabetes in her other and sister; Heart attack in her maternal uncle, mother, other, and other; Heart disease in her other; Hypertension in her brother and sister; Kidney disease in her other; Melanoma in her other; Multiple sclerosis in her brother and other;  Rheum arthritis in her sister; Stroke in her maternal grandmother and other; Thyroid disease in her sister. Allergies  Allergen Reactions  . Penicillins Hives    Has patient had a PCN reaction causing immediate rash, facial/tongue/throat swelling, SOB or lightheadedness with hypotension: Yes Has patient had a PCN reaction causing severe rash involving mucus membranes or skin necrosis: No Has patient had a PCN reaction that required hospitalization No Has patient had a PCN reaction occurring within the last 10 years: No If all of the above answers are "NO", then may proceed with Cephalosporin use.    Current Outpatient Medications on File Prior to Visit  Medication Sig Dispense Refill  . albuterol (PROVENTIL) (2.5 MG/3ML) 0.083% nebulizer solution Take 3 mLs (2.5 mg total) by nebulization every 6 (six) hours as needed for wheezing or shortness of breath. DX: J44.1 75 mL 3  . ALPRAZolam (XANAX) 0.5 MG tablet TAKE 1/2 TO 1 TABLET BY MOUTH 3 TIMES A DAY 90 tablet 0  . aspirin EC 81 MG EC tablet Take 1 tablet (81 mg total) by mouth daily.    . Cholecalciferol (VITAMIN D3) 1000 units CAPS Take 1 capsule (1,000 Units total) by mouth 2 (two) times daily. 60 capsule 6  . furosemide (LASIX) 40 MG tablet Take 1 tablet (40 mg total) daily as needed by mouth (for weight gain of 3 lbs). 30 tablet 6  . HYDROcodone-acetaminophen (NORCO) 7.5-325 MG tablet TK 1 T PO Q 4 H PRN FOR MODERATE PAIN 90  tablet 0  . ipratropium-albuterol (DUONEB) 0.5-2.5 (3) MG/3ML SOLN Take 3 mLs by nebulization every 4 (four) hours as needed (cough). 120 mL 3  . metoprolol tartrate (LOPRESSOR) 25 MG tablet Take 0.5 tablets (12.5 mg total) by mouth 2 (two) times daily. 30 tablet 2  . nitroGLYCERIN (NITROSTAT) 0.4 MG SL tablet Place 1 tablet (0.4 mg total) under the tongue every 5 (five) minutes as needed for chest pain. 25 tablet 3  . PAXIL CR 25 MG 24 hr tablet TAKE 1 TABLET BY MOUTH  EVERY MORNING 90 tablet 1  . pravastatin  (PRAVACHOL) 20 MG tablet Take 1 tablet (20 mg total) by mouth every evening. 30 tablet 11  . Spacer/Aero Chamber Mouthpiece MISC 1 Device by Does not apply route as directed. 1 each 0  . SYMBICORT 160-4.5 MCG/ACT inhaler INHALE 2 PUFFS BY MOUTH TWICE A DAY 10.2 g 5  . Tiotropium Bromide Monohydrate (SPIRIVA RESPIMAT) 2.5 MCG/ACT AERS Inhale 2 puffs into the lungs daily. 1 Inhaler 5  . Tiotropium Bromide-Olodaterol (STIOLTO RESPIMAT) 2.5-2.5 MCG/ACT AERS Inhale 2 puffs into the lungs daily. 1 Inhaler 0  . VENTOLIN HFA 108 (90 Base) MCG/ACT inhaler INHALE 2 PUFFS BY MOUTH INTO THE LUNGS EVERY 4 HOURS AS NEEDED FOR WHEEZING OR SHORTNESS OF BREATH 18 g 0  . vitamin B-12 (CYANOCOBALAMIN) 500 MCG tablet Take 500 mcg by mouth 2 (two) times daily.     No current facility-administered medications on file prior to visit.    Review of Systems  Constitutional: Negative for other unusual diaphoresis or sweats HENT: Negative for ear discharge or swelling Eyes: Negative for other worsening visual disturbances Respiratory: Negative for stridor or other swelling  Gastrointestinal: Negative for worsening distension or other blood Genitourinary: Negative for retention or other urinary change Musculoskeletal: Negative for other MSK pain or swelling Skin: Negative for color change or other new lesions Neurological: Negative for worsening tremors and other numbness  Psychiatric/Behavioral: Negative for worsening agitation or other fatigue All other system neg pre pt    Objective:   Physical Exam BP 132/86   Pulse 73   Temp 98.1 F (36.7 C) (Oral)   Ht '5\' 3"'$  (1.6 m)   Wt 130 lb (59 kg)   SpO2 95%   BMI 23.03 kg/m  VS noted, mild ill Constitutional: Pt appears in NAD HENT: Head: NCAT.  Right Ear: External ear normal.  Left Ear: External ear normal.  Eyes: . Pupils are equal, round, and reactive to light. Conjunctivae and EOM are normal Bilat tm's with mild erythema.  Max sinus areas non tender.   Pharynx with mild erythema, no exudate Nose: without d/c or deformity Neck: Neck supple. Gross normal ROM Cardiovascular: Normal rate and regular rhythm.   Pulmonary/Chest: Effort normal and breath sounds decreased without rales but with mild bilat wheezing.  Neurological: Pt is alert. At baseline orientation, motor grossly intact Skin: Skin is warm. No rashes, other new lesions, no LE edema Psychiatric: Pt behavior is normal without agitation  No other exam findings       Assessment & Plan:

## 2017-07-16 ENCOUNTER — Other Ambulatory Visit: Payer: Self-pay | Admitting: Internal Medicine

## 2017-07-16 ENCOUNTER — Telehealth: Payer: Self-pay | Admitting: Internal Medicine

## 2017-07-16 NOTE — Telephone Encounter (Signed)
Copied from [place] [id number]. Topic: Quick Communication - Rx Refill/Question >> Jul 16, 2017  1:51 PM Ether Griffins B wrote: Medication: ALPRAZolam Duanne Moron) 0.5 MG tablet  Preferred Pharmacy (with phone number or street name): [facility]  Agent: Please be advised that RX refills may take up to 3 business days. We ask that you follow-up with your pharmacy.

## 2017-07-16 NOTE — Telephone Encounter (Signed)
Copied from [place] [id number]. Topic: Quick Communication - See Telephone Encounter >> Jul 16, 2017  1:48 PM Ether Griffins B wrote: CRM for notification. See Telephone encounter for: 07/16/17. Pt requesting refill on HYDROcodone-acetaminophen (NORCO) 7.5-325 MG tablet

## 2017-07-16 NOTE — Telephone Encounter (Signed)
Patient notified that Dr.Kwiatkoski is out of the office until Tuesday. Patient verbalized understanding.

## 2017-07-21 ENCOUNTER — Telehealth: Payer: Self-pay | Admitting: Acute Care

## 2017-07-22 ENCOUNTER — Other Ambulatory Visit: Payer: Self-pay

## 2017-07-22 MED ORDER — ALPRAZOLAM 0.5 MG PO TABS: ORAL_TABLET | ORAL | 0 refills | Status: DC

## 2017-07-22 NOTE — Telephone Encounter (Signed)
Spoke with pt and advised that LDCT is not due until 10/2017 and we will call her closer to that time to get it scheduled.  Pt verbalized understanding.  Nothing further needed.

## 2017-07-22 NOTE — Telephone Encounter (Signed)
Spoke to patient informed her that she will need a face to face with Dr.Kwiatkowski for refill. Patient schedule an appointment for her and husband for pain management.

## 2017-08-04 ENCOUNTER — Ambulatory Visit: Payer: 59 | Admitting: Internal Medicine

## 2017-08-04 ENCOUNTER — Encounter: Payer: Self-pay | Admitting: Internal Medicine

## 2017-08-04 VITALS — BP 110/80 | HR 86 | Temp 98.4°F | Wt 131.0 lb

## 2017-08-04 DIAGNOSIS — G8929 Other chronic pain: Secondary | ICD-10-CM

## 2017-08-04 DIAGNOSIS — M159 Polyosteoarthritis, unspecified: Secondary | ICD-10-CM

## 2017-08-04 DIAGNOSIS — M150 Primary generalized (osteo)arthritis: Secondary | ICD-10-CM | POA: Diagnosis not present

## 2017-08-04 MED ORDER — HYDROCODONE-ACETAMINOPHEN 7.5-325 MG PO TABS: ORAL_TABLET | ORAL | 0 refills | Status: DC

## 2017-08-04 MED ORDER — ALPRAZOLAM 0.5 MG PO TABS: ORAL_TABLET | ORAL | 2 refills | Status: DC

## 2017-08-04 NOTE — Progress Notes (Signed)
   Subjective:    Patient ID: Kim [name], female    DOB: 1956/02/02, 62 y.o.   MRN: [id number]  HPI  Indication for chronic opioid: Patient has a history of osteoarthritis and does take analgesics for cervical and especially low back pain Medication and dose: Hydrocodone 5 mg.  She takes 0 to 3 tablets/day.  She often takes 1/2 tablets at a time # pills per month: Limit 90/month Last UDS date: February 16, 2017 Opioid Treatment Agreement signed (Y/N): Sep 03, 2017 Opioid Treatment Agreement last reviewed with patient:  Sep 03, 2017 [place] reviewed this encounter (include red flags):  Yes  Review of Systems     Objective:   Physical Exam        Assessment & Plan:    Encounter for chronic pain management (G89.29) Narcotic use  (711.90) Pain management contract signed (Z02.89)  Kim [name]

## 2017-08-04 NOTE — Patient Instructions (Signed)
Pulmonary follow-up as scheduled  Call or return to clinic prn if these symptoms worsen or fail to improve as anticipated.

## 2017-08-06 ENCOUNTER — Ambulatory Visit: Payer: 59 | Admitting: Cardiovascular Disease

## 2017-08-06 ENCOUNTER — Encounter: Payer: Self-pay | Admitting: Cardiovascular Disease

## 2017-08-06 VITALS — BP 122/68 | HR 75 | Ht 63.0 in | Wt 132.8 lb

## 2017-08-06 DIAGNOSIS — I428 Other cardiomyopathies: Secondary | ICD-10-CM

## 2017-08-06 DIAGNOSIS — E7800 Pure hypercholesterolemia, unspecified: Secondary | ICD-10-CM | POA: Diagnosis not present

## 2017-08-06 DIAGNOSIS — I2510 Atherosclerotic heart disease of native coronary artery without angina pectoris: Secondary | ICD-10-CM | POA: Diagnosis not present

## 2017-08-06 MED ORDER — PRAVASTATIN SODIUM 20 MG PO TABS: 20.0000 mg | ORAL_TABLET | Freq: Every evening | ORAL | 3 refills | Status: DC

## 2017-08-06 NOTE — Progress Notes (Signed)
Chief Complaint  Patient presents with  . Follow-up    CAD    History of Present Illness: 62 yo female with history of COPD, HLD, tobacco abuse here today for cardiac follow up. I saw her as a new patient in April 2016. She was hospitalized in [place] December 2015 with COPD exacerbation. Troponin was mildly elevated at 2. Cardiac catheterization in 2015 showed no obstructive CAD. She was felt to have a COPD exacerbation and she improved with treatment of her COPD. She quit smoking in September 2015. Admitted to [facility] March 2017 with respiratory distress, CHF. Cardiac cath March 2017 with minimal CAD, severe LV systolic dysfunction (DGUY=40-34%). 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  She was diuresed with clinical improvement. She has been seen several times in our office after discharge with dizziness and hypotension. Lisinopril and Lasix were decreased. She is followed in the pulmonary office by Dr. Elsworth Soho. Event monitor April 2017 with sinus, PACs, PVCs. Echo May 2018 with normal LV systolic function, mild AI.   She is here today for follow up. The patient denies any chest pain, dyspnea, palpitations, lower extremity edema, orthopnea, PND, dizziness, near syncope or syncope.   Primary Care Physician: Marletta Lor, MD   Past Medical History:  Diagnosis Date  . C8 RADICULOPATHY 06/05/2009  . COPD 08/31/2007  . DEPRESSION 11/19/2006  . DYSLIPIDEMIA 03/12/2009  . MI (mitral incompetence)   . 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 OSTEOARTHRITIS 06/05/2009  . PARESTHESIA 08/24/2007  . TOBACCO ABUSE 01/25/2010    Past Surgical History:  Procedure Laterality Date  . APPENDECTOMY  1969  . CARDIAC CATHETERIZATION N/A 07/05/2015   Procedure: Left Heart Cath and Coronary Angiography;  Surgeon: Leonie Man, MD;  Location: [facility];  Service: Cardiovascular;  Laterality: N/A;  . CHOLECYSTECTOMY   1989  . collapse lung  1990  . EXPLORATORY LAPAROTOMY  1969    Current Outpatient Medications  Medication Sig Dispense Refill  . albuterol (PROVENTIL) (2.5 MG/3ML) 0.083% nebulizer solution Take 3 mLs (2.5 mg total) by nebulization every 6 (six) hours as needed for wheezing or shortness of breath. DX: J44.1 75 mL 3  . ALPRAZolam (XANAX) 0.5 MG tablet TAKE 1/2 TO 1 TABLET BY MOUTH 3 TIMES A DAY 60 tablet 2  . aspirin EC 81 MG EC tablet Take 1 tablet (81 mg total) by mouth daily.    . Cholecalciferol (VITAMIN D3) 1000 units CAPS Take 1 capsule (1,000 Units total) by mouth 2 (two) times daily. 60 capsule 6  . furosemide (LASIX) 40 MG tablet Take 1 tablet (40 mg total) daily as needed by mouth (for weight gain of 3 lbs). 30 tablet 6  . HYDROcodone-acetaminophen (NORCO) 7.5-325 MG tablet TK 1 T PO Q 4 H PRN FOR MODERATE PAIN 90 tablet 0  . ipratropium-albuterol (DUONEB) 0.5-2.5 (3) MG/3ML SOLN Take 3 mLs by nebulization every 4 (four) hours as needed (cough). 120 mL 3  . nitroGLYCERIN (NITROSTAT) 0.4 MG SL tablet Place 1 tablet (0.4 mg total) under the tongue every 5 (five) minutes as needed for chest pain. 25 tablet 3  . PAXIL CR 25 MG 24 hr tablet TAKE 1 TABLET BY MOUTH  EVERY MORNING 90 tablet 1  . Spacer/Aero Chamber Mouthpiece MISC 1 Device by Does not apply route as directed. 1 each 0  . SYMBICORT 160-4.5 MCG/ACT inhaler INHALE 2 PUFFS BY MOUTH TWICE A DAY 10.2 g 5  .  Tiotropium Bromide Monohydrate (SPIRIVA RESPIMAT) 2.5 MCG/ACT AERS Inhale 2 puffs into the lungs daily. 1 Inhaler 5  . VENTOLIN HFA 108 (90 Base) MCG/ACT inhaler INHALE 2 PUFFS BY MOUTH INTO THE LUNGS EVERY 4 HOURS AS NEEDED FOR WHEEZING OR SHORTNESS OF BREATH 18 g 0  . vitamin B-12 (CYANOCOBALAMIN) 500 MCG tablet Take 500 mcg by mouth 2 (two) times daily.    . metoprolol tartrate (LOPRESSOR) 25 MG tablet Take 0.5 tablets (12.5 mg total) by mouth 2 (two) times daily. 30 tablet 2  . pravastatin (PRAVACHOL) 20 MG tablet Take 1  tablet (20 mg total) by mouth every evening. 90 tablet 3   No current facility-administered medications for this visit.     Allergies  Allergen Reactions  . Penicillins Hives    Has patient had a PCN reaction causing immediate rash, facial/tongue/throat swelling, SOB or lightheadedness with hypotension: Yes Has patient had a PCN reaction causing severe rash involving mucus membranes or skin necrosis: No Has patient had a PCN reaction that required hospitalization No Has patient had a PCN reaction occurring within the last 10 years: No If all of the above answers are "NO", then may proceed with Cephalosporin use.     Social History   Socioeconomic History  . Marital status: Married    Spouse name: Not on file  . Number of children: 2  . Years of education: Not on file  . Highest education level: Not on file  Occupational History  . Occupation: Patient care aide-sits with elderly lady  Social Needs  . Financial resource strain: Not hard at all  . Food insecurity:    Worry: Never true    Inability: Never true  . Transportation needs:    Medical: No    Non-medical: No  Tobacco Use  . Smoking status: Former Smoker    Packs/day: 2.00    Years: 38.00    Pack years: 76.00    Types: Cigarettes    Last attempt to quit: 03/07/2014    Years since quitting: 3.4  . Smokeless tobacco: Never Used  Substance and Sexual Activity  . Alcohol use: No    Alcohol/week: 0.0 oz  . Drug use: No  . Sexual activity: Not on file  Lifestyle  . Physical activity:    Days per week: 0 days    Minutes per session: 0 min  . Stress: Not at all  Relationships  . Social connections:    Talks on phone: More than three times a week    Gets together: More than three times a week    Attends religious service: More than 4 times per year    Active member of club or organization: No    Attends meetings of clubs or organizations: Never    Relationship status: Married  . Intimate partner violence:     Fear of current or ex partner: No    Emotionally abused: No    Physically abused: No    Forced sexual activity: No  Other Topics Concern  . Not on file  Social History Narrative   [facility] (01/12/17):   Originally from [facility]. Has lived in [place] for 17 years. Married. Has 2 dogs currently. No mold and only 1 indoor plant. Had her home professionally cleaned a month ago. No bird or hot tub exposure. 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     Family History  Problem Relation Age of Onset  . Cancer Mother  lung  . Heart attack Mother   . Hypertension Sister   . Multiple sclerosis Brother   . Diabetes Sister   . Rheum arthritis Sister   . Thyroid disease Sister   . Multiple sclerosis Other   . Alcohol abuse Other   . Arthritis Other   . Diabetes Other   . Kidney disease Other   . Cancer Other        lung,ovarian,skin, uterine  . Stroke Other   . Heart disease Other   . Melanoma Other   . Heart attack Maternal Uncle   . Heart attack Other        NEICE  . Heart attack Other        NEPHEW  . Hypertension Brother   . Stroke Maternal Grandmother   . Colon cancer Neg Hx     Review of Systems:  As stated in the HPI and otherwise negative.   BP 122/68   Pulse 75   Ht 5\' 3"  (1.6 m)   Wt 132 lb 12.8 oz (60.2 kg)   SpO2 97%   BMI 23.52 kg/m   Physical Examination:  General: Well developed, well nourished, NAD  HEENT: OP clear, mucus membranes moist  SKIN: warm, dry. No rashes. Neuro: No focal deficits  Musculoskeletal: Muscle strength 5/5 all ext  Psychiatric: Mood and affect normal  Neck: No JVD, no carotid bruits, no thyromegaly, no lymphadenopathy.  Lungs:Clear bilaterally, no wheezes, rhonci, crackles Cardiovascular: Regular rate and rhythm. No murmurs, gallops or rubs. Abdomen:Soft. Bowel sounds present. Non-tender.  Extremities: No lower extremity edema. Pulses are 2 + in the bilateral DP/PT.  Echo 09/03/16: - Left  ventricle: The cavity size was normal. Systolic function was   normal. The estimated ejection fraction was in the range of 60%   to 65%. Wall motion was normal; there were no regional wall   motion abnormalities. - Aortic valve: There was mild regurgitation. - Atrial septum: No defect or patent foramen ovale was identified.  EKG:  EKG is not ordered today. The ekg ordered today demonstrates   Recent Labs: 08/26/2016: NT-Pro BNP 43; TSH 0.921 12/04/2016: ALT 12 01/18/2017: BUN 23; Creatinine, Ser 0.74; Hemoglobin 12.8; Platelets 282; Potassium 4.3; Sodium 138   Lipid Panel    Component Value Date/Time   CHOL 182 03/07/2016 0901   TRIG 151.0 (H) 03/07/2016 0901   HDL 38.70 (L) 03/07/2016 0901   CHOLHDL 5 03/07/2016 0901   VLDL 30.2 03/07/2016 0901   LDLCALC 113 (H) 03/07/2016 0901   LDLDIRECT 155.4 12/29/2012 1209     Wt Readings from Last 3 Encounters:  08/06/17 132 lb 12.8 oz (60.2 kg)  08/04/17 131 lb (59.4 kg)  06/13/17 130 lb (59 kg)     Other studies Reviewed: Additional studies/ records that were reviewed today include: Records from [facility]. Review of the above records demonstrates:   Assessment and Plan:   1. CAD without angina: She has no chest pain. Mild CAD by cath in 2017. Will continue ASA, beta blocker and statin.    2. Tobacco abuse, in remission: She does not wish to start back smoking  3. Hyperlipidemia: Continue statin  4. Chronic systolic CHF/Non-ischemic cardiomyopathy: LV function normal by echo May 2018.  Weight is stable. Will continue Lisinopril and Coreg. She uses Lasix prn.   5. COPD: closely followed by pulmonary.  Current medicines are reviewed at length with the patient today.  The patient does not have concerns regarding medicines.  The following  changes have been made:  no change  Labs/ tests ordered today include:   Orders Placed This Encounter  Procedures  . Lipid Profile  . Hepatic function panel    Disposition:    FU with me in 12  months  Signed, Lauree Chandler, MD 08/06/2017 3:45 PM    [facility] [address], [place], [place]  [address] Phone: [phone]; Fax: [phone]

## 2017-08-06 NOTE — Patient Instructions (Signed)
Medication Instructions:  Your physician recommends that you continue on your current medications as directed. Please refer to the Current Medication list given to you today.   Labwork: Your physician recommends that you return for lab work tomorrow.  This will be fasting.  Lipid and Liver profiles.  The lab opens at 7:30AM   Testing/Procedures: none  Follow-Up: Your physician recommends that you schedule a follow-up appointment in: 12 months. Please call our office in about 8 months to schedule this appointment    Any Other Special Instructions Will Be Listed Below (If Applicable).     If you need a refill on your cardiac medications before your next appointment, please call your pharmacy.

## 2017-08-07 ENCOUNTER — Other Ambulatory Visit: Payer: 59 | Admitting: *Deleted

## 2017-08-07 ENCOUNTER — Encounter: Payer: Self-pay | Admitting: Internal Medicine

## 2017-08-07 DIAGNOSIS — E7800 Pure hypercholesterolemia, unspecified: Secondary | ICD-10-CM

## 2017-08-07 LAB — LIPID PANEL
CHOLESTEROL TOTAL: 203 mg/dL — AB (ref 100–199)
Chol/HDL Ratio: 4.8 ratio — ABNORMAL HIGH (ref 0.0–4.4)
HDL: 42 mg/dL (ref >39)
LDL CALC: 134 mg/dL — AB (ref 0–99)
TRIGLYCERIDES: 136 mg/dL (ref 0–149)
VLDL CHOLESTEROL CAL: 27 mg/dL (ref 5–40)

## 2017-08-07 LAB — HEPATIC FUNCTION PANEL
ALK PHOS: 69 IU/L (ref 39–117)
ALT: 10 IU/L (ref 0–32)
AST: 12 IU/L (ref 0–40)
Albumin: 4.5 g/dL (ref 3.6–4.8)
Bilirubin Total: 0.4 mg/dL (ref 0.0–1.2)
Bilirubin, Direct: 0.07 mg/dL (ref 0.00–0.40)
Total Protein: 6.2 g/dL (ref 6.0–8.5)

## 2017-08-10 ENCOUNTER — Other Ambulatory Visit: Payer: Self-pay | Admitting: *Deleted

## 2017-08-10 DIAGNOSIS — E7800 Pure hypercholesterolemia, unspecified: Secondary | ICD-10-CM

## 2017-08-10 MED ORDER — PRAVASTATIN SODIUM 40 MG PO TABS: 40.0000 mg | ORAL_TABLET | Freq: Every evening | ORAL | 3 refills | Status: DC

## 2017-08-23 ENCOUNTER — Other Ambulatory Visit: Payer: Self-pay | Admitting: Internal Medicine

## 2017-09-21 ENCOUNTER — Ambulatory Visit: Payer: 59 | Admitting: Pulmonary Disease

## 2017-09-21 ENCOUNTER — Encounter: Payer: Self-pay | Admitting: Pulmonary Disease

## 2017-09-21 DIAGNOSIS — J449 Chronic obstructive pulmonary disease, unspecified: Secondary | ICD-10-CM | POA: Diagnosis not present

## 2017-09-21 DIAGNOSIS — J9611 Chronic respiratory failure with hypoxia: Secondary | ICD-10-CM

## 2017-09-21 NOTE — Progress Notes (Signed)
   Subjective:    Patient ID: Kim [name], female    DOB: Aug 08, 1955, 62 y.o.   MRN: [id number]  HPI  62 y.o.female with severe COPD on home O2 2L/m.   She has done well last 4 months, she had multiple hospitalizations before birth now seems to have settled down.  In fact she is not needing the oxygen much. On her last visit, ambulatory saturation was around 91%. Unfortunately her brother passed away with metastatic renal cancer, she is still grieving but seems to be coping well.  She is compliant with Symbicort and Spiriva.  She uses albuterol nebs occasionally about once a week Still wants to hold onto her oxygen She does not want to perform spirometry maneuver today.  "I would rather not know"  Significant tests/ events reviewed  11/2014: FVC 2.00 L (63%) FEV1 1.00 L (41%) ratio 50  negative bronchodilator response TLC 5.06 L (103%) RV 140% DLCO uncorrected 51%  10/2016 CT chest showed subcentimeter nodules which have been stable since 2017, right adrenal nodule was stable since 2015 and considered benign  Review of Systems Patient denies significant dyspnea,cough, hemoptysis,  chest pain, palpitations, pedal edema, orthopnea, paroxysmal nocturnal dyspnea, lightheadedness, nausea, vomiting, abdominal or  leg pains      Objective:   Physical Exam  Gen. Pleasant, well-nourished, in no distress ENT - no thrush, no post nasal drip Neck: No JVD, no thyromegaly, no carotid bruits Lungs: no use of accessory muscles, no dullness to percussion, decreased without rales or rhonchi  Cardiovascular: Rhythm regular, heart sounds  normal, no murmurs or gallops, no peripheral edema Musculoskeletal: No deformities, no cyanosis or clubbing        Assessment & Plan:

## 2017-09-21 NOTE — Assessment & Plan Note (Signed)
Okay to use oxygen only as needed  Hypoxia seems to have improved

## 2017-09-21 NOTE — Patient Instructions (Signed)
COPD is stable. You are doing well. Continue on Symbicort and Spiriva. We discussed signs and symptoms of COPD exacerbation, call us early if you develop yellow-green sputum or wheezing or chest cold

## 2017-09-21 NOTE — Assessment & Plan Note (Signed)
COPD is stable. Continue on Symbicort and Spiriva. We discussed signs and symptoms of COPD exacerbation, call us early if you develop yellow-green sputum or wheezing or chest cold

## 2017-10-29 ENCOUNTER — Ambulatory Visit (INDEPENDENT_AMBULATORY_CARE_PROVIDER_SITE_OTHER)
Admission: RE | Admit: 2017-10-29 | Discharge: 2017-10-29 | Disposition: A | Discharge status: Home / Self Care | Payer: 59 | Source: Ambulatory Visit | Attending: Acute Care | Admitting: Acute Care

## 2017-10-29 DIAGNOSIS — Z87891 Personal history of nicotine dependence: Secondary | ICD-10-CM

## 2017-10-29 DIAGNOSIS — Z122 Encounter for screening for malignant neoplasm of respiratory organs: Secondary | ICD-10-CM

## 2017-11-02 ENCOUNTER — Other Ambulatory Visit: Payer: 59

## 2017-11-03 ENCOUNTER — Other Ambulatory Visit: Payer: 59

## 2017-11-03 ENCOUNTER — Other Ambulatory Visit: Payer: Self-pay | Admitting: Acute Care

## 2017-11-03 DIAGNOSIS — E7800 Pure hypercholesterolemia, unspecified: Secondary | ICD-10-CM

## 2017-11-03 DIAGNOSIS — Z122 Encounter for screening for malignant neoplasm of respiratory organs: Secondary | ICD-10-CM

## 2017-11-03 DIAGNOSIS — Z87891 Personal history of nicotine dependence: Secondary | ICD-10-CM

## 2017-11-03 LAB — HEPATIC FUNCTION PANEL
ALBUMIN: 4.4 g/dL (ref 3.6–4.8)
ALT: 9 IU/L (ref 0–32)
AST: 14 IU/L (ref 0–40)
Alkaline Phosphatase: 71 IU/L (ref 39–117)
BILIRUBIN TOTAL: 0.3 mg/dL (ref 0.0–1.2)
BILIRUBIN, DIRECT: 0.08 mg/dL (ref 0.00–0.40)
TOTAL PROTEIN: 6.2 g/dL (ref 6.0–8.5)

## 2017-11-03 LAB — LIPID PANEL
CHOL/HDL RATIO: 5.7 ratio — AB (ref 0.0–4.4)
Cholesterol, Total: 187 mg/dL (ref 100–199)
HDL: 33 mg/dL — ABNORMAL LOW (ref >39)
LDL Calculated: 120 mg/dL — ABNORMAL HIGH (ref 0–99)
TRIGLYCERIDES: 171 mg/dL — AB (ref 0–149)
VLDL Cholesterol Cal: 34 mg/dL (ref 5–40)

## 2017-11-04 ENCOUNTER — Ambulatory Visit (INDEPENDENT_AMBULATORY_CARE_PROVIDER_SITE_OTHER): Payer: 59 | Admitting: Internal Medicine

## 2017-11-04 ENCOUNTER — Other Ambulatory Visit: Payer: Self-pay | Admitting: *Deleted

## 2017-11-04 ENCOUNTER — Encounter: Payer: Self-pay | Admitting: Internal Medicine

## 2017-11-04 VITALS — BP 138/70 | HR 61 | Temp 98.1°F | Wt 130.0 lb

## 2017-11-04 DIAGNOSIS — G8929 Other chronic pain: Secondary | ICD-10-CM | POA: Diagnosis not present

## 2017-11-04 DIAGNOSIS — M159 Polyosteoarthritis, unspecified: Secondary | ICD-10-CM

## 2017-11-04 DIAGNOSIS — M150 Primary generalized (osteo)arthritis: Secondary | ICD-10-CM

## 2017-11-04 DIAGNOSIS — E7800 Pure hypercholesterolemia, unspecified: Secondary | ICD-10-CM

## 2017-11-04 MED ORDER — ALPRAZOLAM 0.5 MG PO TABS: ORAL_TABLET | ORAL | 2 refills | Status: DC

## 2017-11-04 MED ORDER — HYDROCODONE-ACETAMINOPHEN 7.5-325 MG PO TABS: ORAL_TABLET | ORAL | 0 refills | Status: DC

## 2017-11-04 MED ORDER — PAROXETINE HCL ER 25 MG PO TB24: 25.0000 mg | ORAL_TABLET | Freq: Every morning | ORAL | 4 refills | Status: DC

## 2017-11-04 MED ORDER — METOPROLOL TARTRATE 25 MG PO TABS: 12.5000 mg | ORAL_TABLET | Freq: Two times a day (BID) | ORAL | 2 refills | Status: DC

## 2017-11-04 MED ORDER — ROSUVASTATIN CALCIUM 20 MG PO TABS: 20.0000 mg | ORAL_TABLET | Freq: Every day | ORAL | 11 refills | Status: DC

## 2017-11-04 MED ORDER — BUDESONIDE-FORMOTEROL FUMARATE 160-4.5 MCG/ACT IN AERO: 2.0000 | INHALATION_SPRAY | Freq: Two times a day (BID) | RESPIRATORY_TRACT | 5 refills | Status: DC

## 2017-11-04 MED ORDER — PRAVASTATIN SODIUM 40 MG PO TABS: 40.0000 mg | ORAL_TABLET | Freq: Every evening | ORAL | 3 refills | Status: DC

## 2017-11-04 MED ORDER — TIOTROPIUM BROMIDE MONOHYDRATE 2.5 MCG/ACT IN AERS: 2.0000 | INHALATION_SPRAY | Freq: Every day | RESPIRATORY_TRACT | 5 refills | Status: DC

## 2017-11-04 NOTE — Progress Notes (Signed)
   Subjective:    Patient ID: Kim [name], female    DOB: 01-13-1956, 62 y.o.   MRN: [id number]  HPI  62 year old patient who is seen today for chronic pain management per opioid protocol  [place] printed, initialed  and scanned into the EMR.  Indication for chronic opioid: Patient has a history of osteoarthritis with pain primarily in the cervical and lumbar area.  She occasionally takes hydrocodone to alleviate dyspnea Medication and dose: She states she ordinarily takes between 0 and 3 tablets/day.  Often takes 1/2 tablet rather than a full tablet ; hydrocodone 5 mg.  She also uses as needed alprazolam # pills per month: Previously had been receiving 90/month.  She has been getting these refilled at 90-day intervals.  She does admit to having a supply of hydrocodone.  She was under the impression that a 58-month visit was required.  She was informed that follow-up visits are needed when she requires additional refills.  Will decrease monthly supply to 60/month Last UDS date: November February 16, 2017 Opioid Treatment Agreement signed (Y/N): February 16, 2017 Opioid Treatment Agreement last reviewed with patient:   NCCSRS reviewed this encounter (include red flags):  February 16, 2017 overdose risk score 450;   Review of Systems     Objective:   Physical Exam        Assessment & Plan:   Encounter for chronic pain management (G89.29) Narcotic use  (711.90) Osteoarthritis  I written a prescription for hydrocodone 5 #180 dispensed.  She is aware this must last in excess of 90 days  Marletta Lor

## 2017-11-04 NOTE — Patient Instructions (Addendum)
Return in 3 months for follow-up or as needed for pain medicine refill

## 2017-11-04 NOTE — Progress Notes (Signed)
Notes recorded by Thompson Grayer, RN on 11/04/2017 at 9:20 AM EDT Pt notified. She is willing to change to Crestor 20 mg daily. Will send prescription to [facility] on [address]. Pt will come in for fasting lipid and liver profiles on October 21,2019 ------  Notes recorded by [name], MD on 11/04/2017 at 8:23 AM EDT I reviewed her cath. She has very mild CAD. Her LDL goal is 70 given her CAD. LDL is only minimally improved after increasing Pravachol to 40 mg 12 weeks ago. Can we see if she would consider changing to Crestor 20 mg daily? Thanks, chris

## 2017-12-15 ENCOUNTER — Encounter: Payer: 59 | Admitting: Internal Medicine

## 2017-12-17 ENCOUNTER — Encounter: Payer: Self-pay | Admitting: Adult Health

## 2017-12-17 ENCOUNTER — Ambulatory Visit: Payer: 59 | Admitting: Adult Health

## 2017-12-17 VITALS — BP 106/56 | Temp 98.0°F | Wt 128.0 lb

## 2017-12-17 DIAGNOSIS — M159 Polyosteoarthritis, unspecified: Secondary | ICD-10-CM

## 2017-12-17 DIAGNOSIS — Z7689 Persons encountering health services in other specified circumstances: Secondary | ICD-10-CM

## 2017-12-17 DIAGNOSIS — J449 Chronic obstructive pulmonary disease, unspecified: Secondary | ICD-10-CM

## 2017-12-17 DIAGNOSIS — E785 Hyperlipidemia, unspecified: Secondary | ICD-10-CM | POA: Diagnosis not present

## 2017-12-17 DIAGNOSIS — Z23 Encounter for immunization: Secondary | ICD-10-CM | POA: Diagnosis not present

## 2017-12-17 DIAGNOSIS — M150 Primary generalized (osteo)arthritis: Secondary | ICD-10-CM

## 2017-12-17 MED ORDER — HYDROCODONE-ACETAMINOPHEN 7.5-325 MG PO TABS: 1.0000 | ORAL_TABLET | Freq: Four times a day (QID) | ORAL | 0 refills | Status: DC | PRN

## 2017-12-17 NOTE — Progress Notes (Signed)
Patient presents to clinic today to establish care. 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  She is a former patient of Dr. Raliegh Ip. She is transferring care.   Her last CPE was in December 2018  Acute Concerns: Establish Care  Chronic Issues: COPD - Followed by Pulmonary. Controlled with Symbicort and Spirivia. She uses albuterol nebs occasionally, about once a week. She has home oxygen but does not have to use it much  Hyperlipidemia - takes statin and ASA- followed by Cardiology   CAD - Cath in 2017 showed mild CAD. Currently prescribed BB and ASA. She stopped taking Crestor due to muscle cramping, she stopped on August 31st.   Osteoarthritis - prescribed Hydrocodone by previous PCP. Pain is located in lumbar and cervical spine. She reports that she also takes Hydrocodone when she has COPD flares. She needs a new prescription, her previous prescription was sent in for 90 days and insurance will not cover this.   Tobacco Use - Quit smoking 4 years ago.   Anxiety/Depression - Controlled with Paxil and Xanax 0.5 mg   Health Maintenance: Dental -- Full dentures  Vision -- Routine Care -  Immunizations -- UTD  Colonoscopy -- UTD - due 03/2018 Mammogram -- UTD  PAP -- Due  Bone Density -- Never had  Diet: Eating healthy.  Exercise: Has been exercising - She is walking with a group from church three days a week   Treatment Team  - Pulmonary - Dr. Elsworth Soho  - Cardiology - Dr. Angelena Form   Past Medical History:  Diagnosis Date  . C8 RADICULOPATHY 06/05/2009  . COPD 08/31/2007  . DEPRESSION 11/19/2006  . DYSLIPIDEMIA 03/12/2009  . MI (mitral incompetence)   . 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 OSTEOARTHRITIS 06/05/2009  . PARESTHESIA  08/24/2007  . TOBACCO ABUSE 01/25/2010    Past Surgical History:  Procedure Laterality Date  . APPENDECTOMY  1969  . CARDIAC CATHETERIZATION N/A 07/05/2015   Procedure: Left Heart Cath and Coronary Angiography;  Surgeon: Leonie Man, MD;  Location: [facility];  Service: Cardiovascular;  Laterality: N/A;  . CHOLECYSTECTOMY  1989  . collapse lung  1990  . EXPLORATORY LAPAROTOMY  1969    Current Outpatient Medications on File Prior to Visit  Medication Sig Dispense Refill  . albuterol (PROVENTIL) (2.5 MG/3ML) 0.083% nebulizer solution Take 3 mLs (2.5 mg total) by nebulization every 6 (six) hours as needed for wheezing or shortness of breath. DX: J44.1 75 mL 3  . ALPRAZolam (XANAX) 0.5 MG tablet TAKE 1/2 TO 1 TABLET BY MOUTH 3 TIMES A DAY 60 tablet 2  . aspirin EC 81 MG EC tablet Take 1 tablet (81 mg total) by mouth daily.    . budesonide-formoterol (SYMBICORT) 160-4.5 MCG/ACT inhaler Inhale 2 puffs into the lungs 2 (two) times daily. 10.2 g 5  . Cholecalciferol (VITAMIN D3) 1000 units CAPS Take 1 capsule (1,000 Units total) by mouth 2 (two) times daily. 60 capsule 6  . furosemide (LASIX) 40 MG tablet Take 1 tablet (40 mg total) daily as needed by mouth (for weight gain of 3 lbs). 30 tablet 6  . HYDROcodone-acetaminophen (NORCO) 7.5-325 MG tablet TK 1 T PO Q 4 H PRN FOR MODERATE PAIN 180 tablet 0  . metoprolol tartrate (LOPRESSOR) 25 MG tablet Take 0.5 tablets (12.5  mg total) by mouth 2 (two) times daily. 90 tablet 2  . nitroGLYCERIN (NITROSTAT) 0.4 MG SL tablet Place 1 tablet (0.4 mg total) under the tongue every 5 (five) minutes as needed for chest pain. 25 tablet 3  . PARoxetine (PAXIL CR) 25 MG 24 hr tablet Take 1 tablet (25 mg total) by mouth every morning. 90 tablet 4  . rosuvastatin (CRESTOR) 20 MG tablet Take 1 tablet (20 mg total) by mouth daily. 30 tablet 11  . Spacer/Aero Chamber Mouthpiece MISC 1 Device by Does not apply route as directed. 1 each 0  . Tiotropium Bromide  Monohydrate (SPIRIVA RESPIMAT) 2.5 MCG/ACT AERS Inhale 2 puffs into the lungs daily. 1 Inhaler 5  . VENTOLIN HFA 108 (90 Base) MCG/ACT inhaler INHALE 2 PUFFS BY MOUTH INTO THE LUNGS EVERY 4 HOURS AS NEEDED FOR WHEEZING OR SHORTNESS OF BREATH 18 g 0  . vitamin B-12 (CYANOCOBALAMIN) 500 MCG tablet Take 500 mcg by mouth 2 (two) times daily.     No current facility-administered medications on file prior to visit.     Allergies  Allergen Reactions  . Penicillins Hives    Has patient had a PCN reaction causing immediate rash, facial/tongue/throat swelling, SOB or lightheadedness with hypotension: Yes Has patient had a PCN reaction causing severe rash involving mucus membranes or skin necrosis: No Has patient had a PCN reaction that required hospitalization No Has patient had a PCN reaction occurring within the last 10 years: No If all of the above answers are "NO", then may proceed with Cephalosporin use.     Family History  Problem Relation Age of Onset  . Cancer Mother        lung  . Heart attack Mother   . Hypertension Sister   . Multiple sclerosis Brother   . Diabetes Sister   . Rheum arthritis Sister   . Thyroid disease Sister   . Multiple sclerosis Other   . Alcohol abuse Other   . Arthritis Other   . Diabetes Other   . Kidney disease Other   . Cancer Other        lung,ovarian,skin, uterine  . Stroke Other   . Heart disease Other   . Melanoma Other   . Heart attack Maternal Uncle   . Heart attack Other        NEICE  . Heart attack Other        NEPHEW  . Hypertension Brother   . Stroke Maternal Grandmother   . Colon cancer Neg Hx     Social History   Socioeconomic History  . Marital status: Married    Spouse name: Not on file  . Number of children: 2  . Years of education: Not on file  . Highest education level: Not on file  Occupational History  . Occupation: Patient care aide-sits with elderly lady  Social Needs  . Financial resource strain: Not hard at all   . Food insecurity:    Worry: Never true    Inability: Never true  . Transportation needs:    Medical: No    Non-medical: No  Tobacco Use  . Smoking status: Former Smoker    Packs/day: 2.00    Years: 38.00    Pack years: 76.00    Types: Cigarettes    Last attempt to quit: 03/07/2014    Years since quitting: 3.7  . Smokeless tobacco: Never Used  Substance and Sexual Activity  . Alcohol use: No    Alcohol/week: 0.0 standard drinks  .  Drug use: No  . Sexual activity: Not on file  Lifestyle  . Physical activity:    Days per week: 0 days    Minutes per session: 0 min  . Stress: Not at all  Relationships  . Social connections:    Talks on phone: More than three times a week    Gets together: More than three times a week    Attends religious service: More than 4 times per year    Active member of club or organization: No    Attends meetings of clubs or organizations: Never    Relationship status: Married  . Intimate partner violence:    Fear of current or ex partner: No    Emotionally abused: No    Physically abused: No    Forced sexual activity: No  Other Topics Concern  . Not on file  Social History Narrative   [facility] (01/12/17):   Originally from [facility] [place] [facility]. Has lived in [place] for 17 years. Married. Has 2 dogs currently. No mold and only 1 indoor plant. Had her home professionally cleaned a month ago. No bird or hot tub exposure. 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     ROS  There were no vitals taken for this visit.  Physical Exam  Recent Results (from the past 2160 hour(s))  Lipid Profile     Status: Abnormal   Collection Time: 11/03/17  8:50 AM  Result Value Ref Range   Cholesterol, Total 187 100 - 199 mg/dL   Triglycerides 171 (H) 0 - 149 mg/dL   HDL 33 (L) >39 mg/dL   VLDL Cholesterol Cal 34 5 - 40 mg/dL   LDL Calculated 120 (H) 0 - 99 mg/dL   Chol/HDL Ratio 5.7 (H) 0.0 - 4.4 ratio    Comment:                                    T. Chol/HDL Ratio                                             Men  Women                               1/2 Avg.Risk  3.4    3.3                                   Avg.Risk  5.0    4.4                                2X Avg.Risk  9.6    7.1                                3X Avg.Risk 23.4   11.0   Hepatic function panel     Status: None   Collection Time: 11/03/17  8:50 AM  Result Value Ref Range   Total Protein 6.2 6.0 - 8.5 g/dL   Albumin 4.4 3.6 - 4.8 g/dL   Bilirubin Total 0.3 0.0 -  1.2 mg/dL   Bilirubin, Direct 0.08 0.00 - 0.40 mg/dL   Alkaline Phosphatase 71 39 - 117 IU/L   AST 14 0 - 40 IU/L   ALT 9 0 - 32 IU/L    Assessment/Plan: 1. Encounter to establish care - December for CPE  - Follow up sooner if needed - Continue to walk and eat healthy    2. Need for prophylactic vaccination and inoculation against influenza  - Flu Vaccine QUAD 6+ mos PF IM (Fluarix Quad PF)  3. Primary osteoarthritis involving multiple joints  - HYDROcodone-acetaminophen (NORCO) 7.5-325 MG tablet; Take 1 tablet by mouth every 6 (six) hours as needed for moderate pain.  Dispense: 30 tablet; Refill: 0 - HYDROcodone-acetaminophen (NORCO) 7.5-325 MG tablet; Take 1 tablet by mouth every 6 (six) hours as needed for moderate pain.  Dispense: 30 tablet; Refill: 0 - HYDROcodone-acetaminophen (NORCO) 7.5-325 MG tablet; Take 1 tablet by mouth every 6 (six) hours as needed for moderate pain.  Dispense: 30 tablet; Refill: 0  4. Dyslipidemia - Cardiology plan of care Advised to talk to cardiologist about stopping statin   5. COPD GOLD III  - Pulmonary Plan of Care    Kim Peng, NP

## 2017-12-24 ENCOUNTER — Ambulatory Visit: Payer: 59 | Admitting: Adult Health

## 2017-12-24 ENCOUNTER — Telehealth: Payer: Self-pay | Admitting: Adult Health

## 2017-12-24 ENCOUNTER — Ambulatory Visit (HOSPITAL_COMMUNITY)
Admission: RE | Admit: 2017-12-24 | Discharge: 2017-12-24 | Disposition: A | Discharge status: Home / Self Care | Payer: 59 | Source: Ambulatory Visit | Attending: Adult Health | Admitting: Adult Health

## 2017-12-24 ENCOUNTER — Encounter: Payer: Self-pay | Admitting: Adult Health

## 2017-12-24 VITALS — BP 122/64 | HR 73 | Temp 98.3°F | Wt 127.8 lb

## 2017-12-24 DIAGNOSIS — R109 Unspecified abdominal pain: Secondary | ICD-10-CM

## 2017-12-24 DIAGNOSIS — R1909 Other intra-abdominal and pelvic swelling, mass and lump: Secondary | ICD-10-CM | POA: Insufficient documentation

## 2017-12-24 DIAGNOSIS — I700 Atherosclerosis of aorta: Secondary | ICD-10-CM | POA: Insufficient documentation

## 2017-12-24 DIAGNOSIS — N838 Other noninflammatory disorders of ovary, fallopian tube and broad ligament: Secondary | ICD-10-CM

## 2017-12-24 DIAGNOSIS — J439 Emphysema, unspecified: Secondary | ICD-10-CM | POA: Insufficient documentation

## 2017-12-24 LAB — CBC WITH DIFFERENTIAL/PLATELET
BASOS ABS: 0.1 10*3/uL (ref 0.0–0.1)
Basophils Relative: 0.8 % (ref 0.0–3.0)
EOS ABS: 0.2 10*3/uL (ref 0.0–0.7)
Eosinophils Relative: 1.7 % (ref 0.0–5.0)
HCT: 40.2 % (ref 36.0–46.0)
Hemoglobin: 13.2 g/dL (ref 12.0–15.0)
LYMPHS ABS: 3.4 10*3/uL (ref 0.7–4.0)
LYMPHS PCT: 27.4 % (ref 12.0–46.0)
MCHC: 32.9 g/dL (ref 30.0–36.0)
MCV: 91 fl (ref 78.0–100.0)
Monocytes Absolute: 1 10*3/uL (ref 0.1–1.0)
Monocytes Relative: 8.1 % (ref 3.0–12.0)
NEUTROS ABS: 7.8 10*3/uL — AB (ref 1.4–7.7)
NEUTROS PCT: 62 % (ref 43.0–77.0)
PLATELETS: 435 10*3/uL — AB (ref 150.0–400.0)
RBC: 4.42 Mil/uL (ref 3.87–5.11)
RDW: 13.5 % (ref 11.5–15.5)
WBC: 12.5 10*3/uL — ABNORMAL HIGH (ref 4.0–10.5)

## 2017-12-24 LAB — BASIC METABOLIC PANEL
BUN: 12 mg/dL (ref 6–23)
CHLORIDE: 104 meq/L (ref 96–112)
CO2: 30 meq/L (ref 19–32)
Calcium: 9.4 mg/dL (ref 8.4–10.5)
Creatinine, Ser: 0.78 mg/dL (ref 0.40–1.20)
GFR: 79.06 mL/min (ref >60.00)
Glucose, Bld: 105 mg/dL — ABNORMAL HIGH (ref 70–99)
POTASSIUM: 4.6 meq/L (ref 3.5–5.1)
Sodium: 141 mEq/L (ref 135–145)

## 2017-12-24 LAB — POCT URINALYSIS DIPSTICK
BILIRUBIN UA: NEGATIVE
Glucose, UA: NEGATIVE
KETONES UA: NEGATIVE
Leukocytes, UA: NEGATIVE
Nitrite, UA: NEGATIVE
PH UA: 6 (ref 5.0–8.0)
PROTEIN UA: NEGATIVE
RBC UA: NEGATIVE
Spec Grav, UA: >=1.03 — AB (ref 1.010–1.025)
UROBILINOGEN UA: 0.2 U/dL

## 2017-12-24 LAB — HEPATIC FUNCTION PANEL
ALT: 7 U/L (ref 0–35)
AST: 10 U/L (ref 0–37)
Albumin: 3.9 g/dL (ref 3.5–5.2)
Alkaline Phosphatase: 82 U/L (ref 39–117)
BILIRUBIN DIRECT: 0.1 mg/dL (ref 0.0–0.3)
BILIRUBIN TOTAL: 0.4 mg/dL (ref 0.2–1.2)
Total Protein: 6.4 g/dL (ref 6.0–8.3)

## 2017-12-24 MED ORDER — IOHEXOL 300 MG/ML  SOLN
75.0000 mL | Freq: Once | INTRAMUSCULAR | Status: AC | PRN
  Administered 2017-12-24: 75 mL via INTRAVENOUS

## 2017-12-24 NOTE — Telephone Encounter (Signed)
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

## 2017-12-24 NOTE — Progress Notes (Addendum)
Subjective:    Patient ID: Kim [name], female    DOB: 04-06-1956, 62 y.o.   MRN: [id number]  HPI  62 year old female who  has a past medical history of C8 RADICULOPATHY (06/05/2009), COPD (08/31/2007), DEPRESSION (11/19/2006), DYSLIPIDEMIA (03/12/2009), MI (mitral incompetence), NEPHROLITHIASIS (01/05/2008), OSTEOARTHRITIS (06/05/2009), PARESTHESIA (08/24/2007), and TOBACCO ABUSE (01/25/2010).  She presents to the office today for an acute issue of abdominal pain. She reports pain since April that has been progressively getting worse. Pain is located across the lower abdomen with radiating pain to left flank. Pain is felt as dull. Pain is worse with palpation and movement. Resting makes pain better. She denies n/v/d/c, burning with urination, fevers. She feels as though her abdomen is getting larger and more hard. Her last BM was this morning and she is passing gas. Denies any issues with loss of appetite.   H/o appendectomy and cholecystectomy   Wt Readings from Last 3 Encounters:  12/24/17 127 lb 12.8 oz (58 kg)  12/17/17 128 lb (58.1 kg)  11/04/17 130 lb (59 kg)      Review of Systems See HPI   Past Medical History:  Diagnosis Date  . C8 RADICULOPATHY 06/05/2009  . COPD 08/31/2007  . DEPRESSION 11/19/2006  . DYSLIPIDEMIA 03/12/2009  . MI (mitral incompetence)   . 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 OSTEOARTHRITIS 06/05/2009  . PARESTHESIA 08/24/2007  . TOBACCO ABUSE 01/25/2010    Social History   Socioeconomic History  . Marital status: Married    Spouse name: Not on file  . Number of children: 2  . Years of education: Not on file  . Highest education level: Not on file  Occupational History  . Occupation: Patient care aide-sits with elderly lady  Social Needs  . Financial resource strain: Not hard at all  . Food insecurity:    Worry: Never true    Inability: Never true  . Transportation needs:    Medical: No    Non-medical: No  Tobacco Use  . Smoking status: Former Smoker      Packs/day: 2.00    Years: 38.00    Pack years: 76.00    Types: Cigarettes    Last attempt to quit: 03/07/2014    Years since quitting: 3.8  . Smokeless tobacco: Never Used  Substance and Sexual Activity  . Alcohol use: No    Alcohol/week: 0.0 standard drinks  . Drug use: No  . Sexual activity: Not on file  Lifestyle  . Physical activity:    Days per week: 0 days    Minutes per session: 0 min  . Stress: Not at all  Relationships  . Social connections:    Talks on phone: More than three times a week    Gets together: More than three times a week    Attends religious service: More than 4 times per year    Active member of club or organization: No    Attends meetings of clubs or organizations: Never    Relationship status: Married  . Intimate partner violence:    Fear of current or ex partner: No    Emotionally abused: No    Physically abused: No    Forced sexual activity: No  Other Topics Concern  . Not on file  Social History Narrative   [facility] (01/12/17):   Originally from [facility]. Has lived in [place] for 17 years. Married. Has 2 dogs currently. No mold and only 1 indoor plant. Had her home professionally cleaned a month  ago. No bird or hot tub exposure. 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     Past Surgical History:  Procedure Laterality Date  . APPENDECTOMY  1969  . CARDIAC CATHETERIZATION N/A 07/05/2015   Procedure: Left Heart Cath and Coronary Angiography;  Surgeon: Leonie Man, MD;  Location: [facility];  Service: Cardiovascular;  Laterality: N/A;  . CHOLECYSTECTOMY  1989  . collapse lung  1990  . EXPLORATORY LAPAROTOMY  1969    Family History  Problem Relation Age of Onset  . Cancer Mother        lung  . Heart attack Mother   . Hypertension Sister   . Multiple sclerosis Brother   . Diabetes Sister   . Rheum arthritis Sister   . Thyroid disease Sister   . Multiple sclerosis Other   . Alcohol abuse Other    . Arthritis Other   . Diabetes Other   . Kidney disease Other   . Cancer Other        lung,ovarian,skin, uterine  . Stroke Other   . Heart disease Other   . Melanoma Other   . Heart attack Maternal Uncle   . Heart attack Other        NEICE  . Heart attack Other        NEPHEW  . Hypertension Brother   . Stroke Maternal Grandmother   . Colon cancer Neg Hx     Allergies  Allergen Reactions  . Penicillins Hives    Has patient had a PCN reaction causing immediate rash, facial/tongue/throat swelling, SOB or lightheadedness with hypotension: Yes Has patient had a PCN reaction causing severe rash involving mucus membranes or skin necrosis: No Has patient had a PCN reaction that required hospitalization No Has patient had a PCN reaction occurring within the last 10 years: No If all of the above answers are "NO", then may proceed with Cephalosporin use.     Current Outpatient Medications on File Prior to Visit  Medication Sig Dispense Refill  . albuterol (PROVENTIL) (2.5 MG/3ML) 0.083% nebulizer solution Take 3 mLs (2.5 mg total) by nebulization every 6 (six) hours as needed for wheezing or shortness of breath. DX: J44.1 75 mL 3  . ALPRAZolam (XANAX) 0.5 MG tablet TAKE 1/2 TO 1 TABLET BY MOUTH 3 TIMES A DAY 60 tablet 2  . aspirin EC 81 MG EC tablet Take 1 tablet (81 mg total) by mouth daily.    . budesonide-formoterol (SYMBICORT) 160-4.5 MCG/ACT inhaler Inhale 2 puffs into the lungs 2 (two) times daily. 10.2 g 5  . Cholecalciferol (VITAMIN D3) 1000 units CAPS Take 1 capsule (1,000 Units total) by mouth 2 (two) times daily. 60 capsule 6  . furosemide (LASIX) 40 MG tablet Take 1 tablet (40 mg total) daily as needed by mouth (for weight gain of 3 lbs). 30 tablet 6  . HYDROcodone-acetaminophen (NORCO) 7.5-325 MG tablet Take 1 tablet by mouth every 6 (six) hours as needed for moderate pain. 30 tablet 0  . HYDROcodone-acetaminophen (NORCO) 7.5-325 MG tablet Take 1 tablet by mouth every 6  (six) hours as needed for moderate pain. 30 tablet 0  . HYDROcodone-acetaminophen (NORCO) 7.5-325 MG tablet Take 1 tablet by mouth every 6 (six) hours as needed for moderate pain. 30 tablet 0  . metoprolol tartrate (LOPRESSOR) 25 MG tablet Take 0.5 tablets (12.5 mg total) by mouth 2 (two) times daily. 90 tablet 2  . nitroGLYCERIN (NITROSTAT) 0.4 MG SL tablet  Place 1 tablet (0.4 mg total) under the tongue every 5 (five) minutes as needed for chest pain. 25 tablet 3  . PARoxetine (PAXIL CR) 25 MG 24 hr tablet Take 1 tablet (25 mg total) by mouth every morning. 90 tablet 4  . rosuvastatin (CRESTOR) 20 MG tablet Take 1 tablet (20 mg total) by mouth daily. 30 tablet 11  . Spacer/Aero Chamber Mouthpiece MISC 1 Device by Does not apply route as directed. 1 each 0  . Tiotropium Bromide Monohydrate (SPIRIVA RESPIMAT) 2.5 MCG/ACT AERS Inhale 2 puffs into the lungs daily. 1 Inhaler 5  . VENTOLIN HFA 108 (90 Base) MCG/ACT inhaler INHALE 2 PUFFS BY MOUTH INTO THE LUNGS EVERY 4 HOURS AS NEEDED FOR WHEEZING OR SHORTNESS OF BREATH 18 g 0  . vitamin B-12 (CYANOCOBALAMIN) 500 MCG tablet Take 500 mcg by mouth 2 (two) times daily.     No current facility-administered medications on file prior to visit.     BP 122/64 (BP Location: Left Arm, Patient Position: Sitting, Cuff Size: Normal)   Pulse 73   Temp 98.3 F (36.8 C) (Oral)   Wt 127 lb 12.8 oz (58 kg)   SpO2 95%   BMI 22.64 kg/m       Objective:   Physical Exam  Constitutional: She is oriented to person, place, and time. She appears well-developed and well-nourished. No distress.  Eyes: Pupils are equal, round, and reactive to light. Conjunctivae and EOM are normal. No scleral icterus.  No jaundice noted   Cardiovascular: Normal rate, regular rhythm, normal heart sounds and intact distal pulses.  Pulmonary/Chest: Effort normal and breath sounds normal.  Abdominal: Bowel sounds are normal. She exhibits distension. She exhibits no fluid wave, no ascites  and no mass. There is generalized tenderness. There is no rebound. No hernia.  Unable to palpate liver   Neurological: She is alert and oriented to person, place, and time.  Skin: Skin is warm and dry. She is not diaphoretic.  Psychiatric: She has a normal mood and affect. Her behavior is normal. Judgment and thought content normal.  Nursing note and vitals reviewed.      Assessment & Plan:  1. Abdominal pain, unspecified abdominal location - hard distended abdomen. Possible constipation but due to time frame, some concern for liver disease. Will get stat CT.  - POCT urinalysis dipstick- negative  - CT Abdomen Pelvis W Contrast; Future - CBC with Differential/Platelet - Basic Metabolic Panel - Hepatic function panel  [name], NP

## 2017-12-24 NOTE — Telephone Encounter (Signed)
Updated patient of CT results.   Will send to [facility] for evaluation

## 2017-12-28 ENCOUNTER — Telehealth: Payer: Self-pay | Admitting: *Deleted

## 2017-12-28 ENCOUNTER — Telehealth: Payer: Self-pay | Admitting: Adult Health

## 2017-12-28 ENCOUNTER — Telehealth: Payer: Self-pay | Admitting: Gynecologic Oncology

## 2017-12-28 NOTE — Telephone Encounter (Signed)
Spoke with patient and she is at the ER at [facility] and possibly will have surgery.  FYI

## 2017-12-28 NOTE — Telephone Encounter (Signed)
Copied from [place] [phone]. Topic: General - Other >> Dec 28, 2017  7:14 AM Kim [name], NT wrote: Reason for CRM: Patient states she would a call back from [facility] in regards to scheduling her follow up with an OB GYN ONCOLOGY , please call her [phone]

## 2017-12-28 NOTE — Telephone Encounter (Signed)
Have spoken with the patient on several occasions this am.  Initially advised that we could see her in the office tomorrow with surgery if needed within the week but patient stating she cannot wait any longer and wants surgery ASAP.  Denies nausea, vomiting, fever, chills but reports moderate pelvic pain along with swelling from the position of the pelvic mass.  States she is in so much discomfort she cannot sit or stand.  She states she has pain medication but wants to take care of the issue.  After several phone calls to [facility], I spoke with Dr. Janie Morning on call at [facility].  She is aware of the patient.  Returned call to the patient and advised her that if her pain is that severe that she can go to [facility] and be evaluated in the ED by the GYN ONC.  She is going to pick up a CD with her CT images to take with her.  All questions answered.

## 2017-12-28 NOTE — Telephone Encounter (Signed)
Received TC from patient. She states she will be a new pt. She saw NP @ [facility] and that a referral was made for her to see an oncologist. She thinks the referral was done Thursday.  She had a pelvic CT scan done on 12/24/17. Checked pt's chart. It appears that the referral came in this morning for GYNOnc.  Informed pt that it may take a couple of days to get appt.  Pt states she is having increasing pain in pelvic area, not relieved with her pain meds.  Pt states that she feels like she needs to be seen ASAP. Advised pt to either call back to [facility] or go to ED for pain symptoms. Pt is aware that she has a large pelvic mass.  I also told pt that I would contact new  Patient referral office about her referral. Pt voiced understanding.

## 2017-12-28 NOTE — Telephone Encounter (Signed)
Copied from [place] [phone]. Topic: General - Other >> Dec 28, 2017  8:11 AM Janace Aris A wrote: Reason for CRM: Patient called in still requesting a phone call from her PCP in  regards to a referral. Patient says she is getting worse as the hours go by and need a number to where she will be referred to.    Please advise.

## 2017-12-29 ENCOUNTER — Encounter: Payer: Self-pay | Admitting: Internal Medicine

## 2018-01-01 MED ORDER — POLYETHYLENE GLYCOL 3350 17 G PO PACK
17.00 | PACK | ORAL | Status: DC
Start: 2018-01-01 — End: 2018-01-01

## 2018-01-01 MED ORDER — ALPRAZOLAM 0.5 MG PO TABS
0.50 | ORAL_TABLET | ORAL | Status: DC
Start: ? — End: 2018-01-01

## 2018-01-01 MED ORDER — INFLUENZA VAC SPLIT QUAD 0.5 ML IM SUSY
0.50 | PREFILLED_SYRINGE | INTRAMUSCULAR | Status: DC
Start: ? — End: 2018-01-01

## 2018-01-01 MED ORDER — HYDROCODONE-ACETAMINOPHEN 5-325 MG PO TABS
1.00 | ORAL_TABLET | ORAL | Status: DC
Start: ? — End: 2018-01-01

## 2018-01-01 MED ORDER — IBUPROFEN 600 MG PO TABS
600.00 | ORAL_TABLET | ORAL | Status: DC
Start: 2017-12-31 — End: 2018-01-01

## 2018-01-01 MED ORDER — MORPHINE SULFATE 4 MG/ML IJ SOLN
4.00 | INTRAMUSCULAR | Status: DC
Start: ? — End: 2018-01-01

## 2018-01-01 MED ORDER — BUDESONIDE-FORMOTEROL FUMARATE 80-4.5 MCG/ACT IN AERO
2.00 | INHALATION_SPRAY | RESPIRATORY_TRACT | Status: DC
Start: 2017-12-31 — End: 2018-01-01

## 2018-01-01 MED ORDER — PNEUMOCOCCAL VAC POLYVALENT 25 MCG/0.5ML IJ INJ
0.50 | INJECTION | INTRAMUSCULAR | Status: DC
Start: ? — End: 2018-01-01

## 2018-01-01 MED ORDER — ENOXAPARIN SODIUM 40 MG/0.4ML ~~LOC~~ SOLN
40.00 | SUBCUTANEOUS | Status: DC
Start: 2018-01-01 — End: 2018-01-01

## 2018-01-01 MED ORDER — GENERIC EXTERNAL MEDICATION
25.00 | Status: DC
Start: 2018-01-01 — End: 2018-01-01

## 2018-01-01 MED ORDER — DOCUSATE SODIUM 100 MG PO CAPS
100.00 | ORAL_CAPSULE | ORAL | Status: DC
Start: 2017-12-31 — End: 2018-01-01

## 2018-01-01 MED ORDER — GENERIC EXTERNAL MEDICATION
Status: DC
Start: ? — End: 2018-01-01

## 2018-01-01 MED ORDER — ONDANSETRON HCL 4 MG/2ML IJ SOLN
4.00 | INTRAMUSCULAR | Status: DC
Start: ? — End: 2018-01-01

## 2018-01-01 MED ORDER — GENERIC EXTERNAL MEDICATION
2.00 | Status: DC
Start: ? — End: 2018-01-01

## 2018-01-01 MED ORDER — GENERIC EXTERNAL MEDICATION
12.50 | Status: DC
Start: 2017-12-31 — End: 2018-01-01

## 2018-01-01 MED ORDER — GENERIC EXTERNAL MEDICATION
1.00 | Status: DC
Start: ? — End: 2018-01-01

## 2018-01-14 ENCOUNTER — Ambulatory Visit: Payer: 59 | Admitting: Adult Health

## 2018-01-14 ENCOUNTER — Inpatient Hospital Stay: Payer: 59 | Admitting: Adult Health

## 2018-01-14 ENCOUNTER — Encounter: Payer: Self-pay | Admitting: Adult Health

## 2018-01-14 VITALS — BP 122/72 | Temp 98.2°F | Resp 20 | Wt 123.4 lb

## 2018-01-14 DIAGNOSIS — Z09 Encounter for follow-up examination after completed treatment for conditions other than malignant neoplasm: Secondary | ICD-10-CM

## 2018-01-14 DIAGNOSIS — Z90722 Acquired absence of ovaries, bilateral: Secondary | ICD-10-CM

## 2018-01-14 DIAGNOSIS — Z90710 Acquired absence of both cervix and uterus: Secondary | ICD-10-CM

## 2018-01-14 DIAGNOSIS — R300 Dysuria: Secondary | ICD-10-CM

## 2018-01-14 LAB — CBC WITH DIFFERENTIAL/PLATELET
BASOS PCT: 1.2 % (ref 0.0–3.0)
Basophils Absolute: 0.1 10*3/uL (ref 0.0–0.1)
EOS PCT: 4.4 % (ref 0.0–5.0)
Eosinophils Absolute: 0.4 10*3/uL (ref 0.0–0.7)
HCT: 34.1 % — ABNORMAL LOW (ref 36.0–46.0)
HEMOGLOBIN: 11.2 g/dL — AB (ref 12.0–15.0)
LYMPHS ABS: 4.3 10*3/uL — AB (ref 0.7–4.0)
Lymphocytes Relative: 46.4 % — ABNORMAL HIGH (ref 12.0–46.0)
MCHC: 32.8 g/dL (ref 30.0–36.0)
MCV: 92.1 fl (ref 78.0–100.0)
MONO ABS: 0.8 10*3/uL (ref 0.1–1.0)
Monocytes Relative: 8.1 % (ref 3.0–12.0)
Neutro Abs: 3.7 10*3/uL (ref 1.4–7.7)
Neutrophils Relative %: 39.9 % — ABNORMAL LOW (ref 43.0–77.0)
Platelets: 500 10*3/uL — ABNORMAL HIGH (ref 150.0–400.0)
RBC: 3.7 Mil/uL — ABNORMAL LOW (ref 3.87–5.11)
RDW: 14.6 % (ref 11.5–15.5)
WBC: 9.3 10*3/uL (ref 4.0–10.5)

## 2018-01-14 LAB — POCT URINALYSIS DIPSTICK
Bilirubin, UA: NEGATIVE
Blood, UA: NEGATIVE
GLUCOSE UA: NEGATIVE
Ketones, UA: NEGATIVE
Leukocytes, UA: NEGATIVE
Nitrite, UA: NEGATIVE
Protein, UA: NEGATIVE
Urobilinogen, UA: 0.2 E.U./dL
pH, UA: 5.5 (ref 5.0–8.0)

## 2018-01-14 LAB — BASIC METABOLIC PANEL
BUN: 21 mg/dL (ref 6–23)
CALCIUM: 9.5 mg/dL (ref 8.4–10.5)
CO2: 29 mEq/L (ref 19–32)
Chloride: 105 mEq/L (ref 96–112)
Creatinine, Ser: 0.77 mg/dL (ref 0.40–1.20)
GFR: 80.23 mL/min (ref >60.00)
GLUCOSE: 90 mg/dL (ref 70–99)
Potassium: 4.6 mEq/L (ref 3.5–5.1)
SODIUM: 142 meq/L (ref 135–145)

## 2018-01-14 NOTE — Progress Notes (Signed)
Subjective:    Patient ID: Kim [name], female    DOB: 1955-04-10, 62 y.o.   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  She presents to the office today for TCM visit   Admit Date: 9:23.2019 Discharge Date: 9.26.2019  She was admitted to [facility] through the emergency room for abdominal pain and enlarging pelvic mass(23 cm x 18 cm x 12 cm heterogeneous appearing pelvic mass which appears cystic in nature with multiple septations).  In the hospital she underwent a total abdominal hysterectomy and bilateral salpingo-oophorectomy on 12/30/2017. Her postoperative course was uncomplicated.   Today in the office she reports that she is healing well. She has not had any purulent drainage, fevers, redness around wound. She continues to complain of "soreness around incisional site. Has not had any constipation. Is managing pain for the most part with motrin.   She has a follow up with her surgeon next month.  Reports that all her biopsies have come back benign   Her only acute complaint is that of burning with urination.  Review of Systems  Constitutional: Negative.   Respiratory: Negative.   Cardiovascular: Negative.   Gastrointestinal: Positive for abdominal pain.  Genitourinary: Positive for dysuria. Negative for difficulty urinating, frequency and urgency.  All other systems reviewed and are negative.  Past Medical History:  Diagnosis Date  . C8 RADICULOPATHY 06/05/2009  . COPD 08/31/2007  . DEPRESSION 11/19/2006  . DYSLIPIDEMIA 03/12/2009  . MI (mitral incompetence)   . 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 OSTEOARTHRITIS 06/05/2009  . PARESTHESIA 08/24/2007  . TOBACCO ABUSE 01/25/2010    Social History    Socioeconomic History  . Marital status: Married    Spouse name: Not on file  . Number of children: 2  . Years of education: Not on file  . Highest education level: Not on file  Occupational History  . Occupation: Patient care aide-sits with elderly lady  Social Needs  . Financial resource strain: Not hard at all  . Food insecurity:    Worry: Never true    Inability: Never true  . Transportation needs:    Medical: No    Non-medical: No  Tobacco Use  . Smoking status: Former Smoker    Packs/day: 2.00    Years: 38.00    Pack years: 76.00    Types: Cigarettes    Last attempt to quit: 03/07/2014    Years since quitting: 3.8  . Smokeless tobacco: Never Used  Substance and Sexual Activity  . Alcohol use: No    Alcohol/week: 0.0 standard drinks  . Drug use: No  . Sexual activity: Not on file  Lifestyle  . Physical activity:    Days per week: 0 days    Minutes per session: 0 min  . Stress: Not at all  Relationships  . Social connections:    Talks on phone: More than three times a week    Gets together: More than three times a week    Attends religious service: More than 4 times per year    Active member of club or organization: No    Attends meetings of clubs or organizations: Never    Relationship status: Married  . Intimate partner violence:    Fear of current or ex partner: No    Emotionally abused: No  Physically abused: No    Forced sexual activity: No  Other Topics Concern  . Not on file  Social History Narrative   [facility] (01/12/17):   Originally from [place]. Has lived in [place] for 17 years. Married. Has 2 dogs currently. No mold and only 1 indoor plant. Had her home professionally cleaned a month ago. No bird or hot tub exposure. 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     Past Surgical History:  Procedure Laterality Date  . ABDOMINAL HYSTERECTOMY    . APPENDECTOMY  1969  . CARDIAC CATHETERIZATION N/A 07/05/2015    Procedure: Left Heart Cath and Coronary Angiography;  Surgeon: Leonie Man, MD;  Location: [facility];  Service: Cardiovascular;  Laterality: N/A;  . CHOLECYSTECTOMY  1989  . collapse lung  1990  . EXCISION MASS ABDOMINAL  2019  . EXPLORATORY LAPAROTOMY  1969  . SALPINGOOPHORECTOMY  2019    Family History  Problem Relation Age of Onset  . Cancer Mother        lung  . Heart attack Mother   . Hypertension Sister   . Multiple sclerosis Brother   . Diabetes Sister   . Rheum arthritis Sister   . Thyroid disease Sister   . Multiple sclerosis Other   . Alcohol abuse Other   . Arthritis Other   . Diabetes Other   . Kidney disease Other   . Cancer Other        lung,ovarian,skin, uterine  . Stroke Other   . Heart disease Other   . Melanoma Other   . Heart attack Maternal Uncle   . Heart attack Other        NEICE  . Heart attack Other        NEPHEW  . Hypertension Brother   . Stroke Maternal Grandmother   . Colon cancer Neg Hx     Allergies  Allergen Reactions  . Penicillins Hives    Has patient had a PCN reaction causing immediate rash, facial/tongue/throat swelling, SOB or lightheadedness with hypotension: Yes Has patient had a PCN reaction causing severe rash involving mucus membranes or skin necrosis: No Has patient had a PCN reaction that required hospitalization No Has patient had a PCN reaction occurring within the last 10 years: No If all of the above answers are "NO", then may proceed with Cephalosporin use.     Current Outpatient Medications on File Prior to Visit  Medication Sig Dispense Refill  . albuterol (PROVENTIL) (2.5 MG/3ML) 0.083% nebulizer solution Take 3 mLs (2.5 mg total) by nebulization every 6 (six) hours as needed for wheezing or shortness of breath. DX: J44.1 75 mL 3  . ALPRAZolam (XANAX) 0.5 MG tablet TAKE 1/2 TO 1 TABLET BY MOUTH 3 TIMES A DAY 60 tablet 2  . aspirin EC 81 MG EC tablet Take 1 tablet (81 mg total) by mouth daily.    .  budesonide-formoterol (SYMBICORT) 160-4.5 MCG/ACT inhaler Inhale 2 puffs into the lungs 2 (two) times daily. 10.2 g 5  . Cholecalciferol (VITAMIN D3) 1000 units CAPS Take 1 capsule (1,000 Units total) by mouth 2 (two) times daily. 60 capsule 6  . furosemide (LASIX) 40 MG tablet Take 1 tablet (40 mg total) daily as needed by mouth (for weight gain of 3 lbs). 30 tablet 6  . HYDROcodone-acetaminophen (NORCO) 7.5-325 MG tablet Take 1 tablet by mouth every 6 (six) hours as needed for moderate pain. 30 tablet 0  .  HYDROcodone-acetaminophen (NORCO) 7.5-325 MG tablet Take 1 tablet by mouth every 6 (six) hours as needed for moderate pain. 30 tablet 0  . HYDROcodone-acetaminophen (NORCO) 7.5-325 MG tablet Take 1 tablet by mouth every 6 (six) hours as needed for moderate pain. 30 tablet 0  . metoprolol tartrate (LOPRESSOR) 25 MG tablet Take 0.5 tablets (12.5 mg total) by mouth 2 (two) times daily. 90 tablet 2  . nitroGLYCERIN (NITROSTAT) 0.4 MG SL tablet Place 1 tablet (0.4 mg total) under the tongue every 5 (five) minutes as needed for chest pain. 25 tablet 3  . PARoxetine (PAXIL CR) 25 MG 24 hr tablet Take 1 tablet (25 mg total) by mouth every morning. 90 tablet 4  . rosuvastatin (CRESTOR) 20 MG tablet Take 1 tablet (20 mg total) by mouth daily. 30 tablet 11  . Spacer/Aero Chamber Mouthpiece MISC 1 Device by Does not apply route as directed. 1 each 0  . Tiotropium Bromide Monohydrate (SPIRIVA RESPIMAT) 2.5 MCG/ACT AERS Inhale 2 puffs into the lungs daily. 1 Inhaler 5  . VENTOLIN HFA 108 (90 Base) MCG/ACT inhaler INHALE 2 PUFFS BY MOUTH INTO THE LUNGS EVERY 4 HOURS AS NEEDED FOR WHEEZING OR SHORTNESS OF BREATH 18 g 0  . vitamin B-12 (CYANOCOBALAMIN) 500 MCG tablet Take 500 mcg by mouth 2 (two) times daily.     No current facility-administered medications on file prior to visit.     BP 122/72   Temp 98.2 F (36.8 C)   Resp 20   Wt 123 lb 6.4 oz (56 kg)   BMI 21.86 kg/m       Objective:   Physical  Exam  Constitutional: She is oriented to person, place, and time. She appears well-developed and well-nourished. No distress.  Cardiovascular: Normal rate, regular rhythm, normal heart sounds and intact distal pulses.  Pulmonary/Chest: Effort normal and breath sounds normal.  Abdominal: Soft. Bowel sounds are normal. She exhibits no distension. There is tenderness (generalized ).  Neurological: She is alert and oriented to person, place, and time.  Skin: Skin is warm and dry. She is not diaphoretic.  Well healing surgical scar midline abdomen. No redness, warmth and drainage.   Psychiatric: She has a normal mood and affect. Her behavior is normal. Judgment and thought content normal.  Nursing note and vitals reviewed.     Assessment & Plan:  1. S/P abdominal surgery, follow-up exam - Reviewed hospital admission notes and labs. All questions answered  - Appears to be healing well. Advised to continue with Motrin for pain relief. Continues with ADLs but no heavy lifting.  - Follow up as needed - CBC with Differential/Platelet - Basic Metabolic Panel  2. Dysuria  - POC Urinalysis Dipstick   [name], NP

## 2018-01-25 ENCOUNTER — Other Ambulatory Visit: Payer: 59 | Admitting: *Deleted

## 2018-01-25 DIAGNOSIS — E7800 Pure hypercholesterolemia, unspecified: Secondary | ICD-10-CM

## 2018-01-26 ENCOUNTER — Telehealth: Payer: Self-pay

## 2018-01-26 LAB — HEPATIC FUNCTION PANEL
ALBUMIN: 4.5 g/dL (ref 3.6–4.8)
ALK PHOS: 69 IU/L (ref 39–117)
ALT: 9 IU/L (ref 0–32)
AST: 14 IU/L (ref 0–40)
BILIRUBIN, DIRECT: 0.07 mg/dL (ref 0.00–0.40)
Bilirubin Total: 0.2 mg/dL (ref 0.0–1.2)
TOTAL PROTEIN: 6.4 g/dL (ref 6.0–8.5)

## 2018-01-26 LAB — LIPID PANEL
CHOL/HDL RATIO: 5.4 ratio — AB (ref 0.0–4.4)
Cholesterol, Total: 221 mg/dL — ABNORMAL HIGH (ref 100–199)
HDL: 41 mg/dL (ref >39)
LDL Calculated: 151 mg/dL — ABNORMAL HIGH (ref 0–99)
Triglycerides: 147 mg/dL (ref 0–149)
VLDL Cholesterol Cal: 29 mg/dL (ref 5–40)

## 2018-01-26 NOTE — Telephone Encounter (Signed)
Spoke with the pt re: her Lipid panel and pt reports that she has been off the Crestor for 6 weeks. She was feeling bad and her friend told her it could be the Crestor so she stopped it but 2 weeks went by and she still did not feel any better. A month ago they had found a 6lb benign mass in her pelvic area coming from her ovary and she had it removed and she feels much better. She is willing to go back on the Crestor but I advised her that I will forward to Dr. Angelena Form and let her know his recommendations if he would like her to continue back with the same dose and when to return for repeat labs.. She reports that she has an appt with her PMD at [facility] on 03/19/18 if her labs can wait until then.

## 2018-01-26 NOTE — Telephone Encounter (Signed)
-----   Message from [name], MD sent at 01/26/2018  3:08 PM EDT ----- Her LDL on Crestor 20 mg daily is now 151 up from 120. Goal LDL is 70. Can we check and see if she has been taking her statin? If so, would increase Crestor to 40 mg daily. Gerald Stabs

## 2018-01-26 NOTE — Telephone Encounter (Signed)
We can have her restart the Crestor at 20 mg daily. Her labs can be drawn at her visit in primary care in December. She will need a fasting lipid profile and LFTs.   Gerald Stabs

## 2018-01-27 MED ORDER — ROSUVASTATIN CALCIUM 20 MG PO TABS: 20.0000 mg | ORAL_TABLET | Freq: Every day | ORAL | 3 refills | Status: DC

## 2018-01-27 NOTE — Telephone Encounter (Signed)
Pt notified. [facility]

## 2018-02-16 ENCOUNTER — Other Ambulatory Visit: Payer: Self-pay | Admitting: Adult Health

## 2018-02-16 MED ORDER — PAROXETINE HCL ER 25 MG PO TB24: 25.0000 mg | ORAL_TABLET | Freq: Every morning | ORAL | 0 refills | Status: DC

## 2018-02-16 NOTE — Telephone Encounter (Signed)
Copied from [place] [id number]. Topic: Quick Communication - Rx Refill/Question >> Feb 16, 2018 11:21 AM Reyne Dumas L wrote: Medication:  PARoxetine (PAXIL CR) 25 MG 24 hr tablet  Pt accidentally let this run out and has reordered through her mail order, but it will be 2 weeks before it gets to her.  Pt would like just a 30 day supply called in to local pharmacy so she doesn't go without any longer - she has been out for two days now.  Preferred Pharmacy (with phone number or street name): [facility] [place], [place] [phone] (Phone) [phone] (Fax)  Agent: Please be advised that RX refills may take up to 3 business days. We ask that you follow-up with your pharmacy.

## 2018-02-16 NOTE — Telephone Encounter (Signed)
Patient needs short term supply until mail order arrives. Requested Prescriptions  Pending Prescriptions Disp Refills  . PARoxetine (PAXIL CR) 25 MG 24 hr tablet 30 tablet 0    Sig: Take 1 tablet (25 mg total) by mouth every morning.     Psychiatry:  Antidepressants - SSRI Passed - 02/16/2018 11:32 AM      Passed - Completed PHQ-2 or PHQ-9 in the last 360 days.      Passed - Valid encounter within last 6 months    Recent Outpatient Visits          1 month ago S/P abdominal surgery, follow-up exam   [facility]rap[facility], [facility] at [facility], [place], NP   1 month ago Abdominal pain, unspecified abdominal location   [facility], [place], NP   2 months ago Need for prophylactic vaccination and inoculation against influenza   Therapist, music at [facility], [place], NP   3 months ago Primary osteoarthritis involving multiple joints   Therapist, music at [facility], Doretha Sou, MD   6 months ago Primary osteoarthritis involving multiple joints   Therapist, music at [facility], Doretha Sou, MD      Future Appointments            In 4 weeks [name], MD [facility]   In 1 month Nafziger, Tommi Rumps, NP [facility] at [place], [facility]

## 2018-03-16 ENCOUNTER — Encounter: Payer: Self-pay | Admitting: Pulmonary Disease

## 2018-03-16 ENCOUNTER — Ambulatory Visit (INDEPENDENT_AMBULATORY_CARE_PROVIDER_SITE_OTHER): Payer: 59 | Admitting: Pulmonary Disease

## 2018-03-16 DIAGNOSIS — J9611 Chronic respiratory failure with hypoxia: Secondary | ICD-10-CM | POA: Diagnosis not present

## 2018-03-16 DIAGNOSIS — J449 Chronic obstructive pulmonary disease, unspecified: Secondary | ICD-10-CM

## 2018-03-16 MED ORDER — BUDESONIDE-FORMOTEROL FUMARATE 160-4.5 MCG/ACT IN AERO: 2.0000 | INHALATION_SPRAY | Freq: Two times a day (BID) | RESPIRATORY_TRACT | 2 refills | Status: DC

## 2018-03-16 NOTE — Assessment & Plan Note (Signed)
Continue Symbicort and Spiriva. She continues to have an active lifestyle. Use albuterol as needed only

## 2018-03-16 NOTE — Patient Instructions (Signed)
Refills on Symbicort and albuterol.  Follow-up annual screening CT chest in July 2020

## 2018-03-16 NOTE — Addendum Note (Signed)
Addended by: Valerie Salts on: 03/16/2018 05:16 PM   Modules accepted: Orders

## 2018-03-16 NOTE — Progress Notes (Signed)
   Subjective:    Patient ID: Kim [name], female    DOB: 02-12-1956, 62 y.o.   MRN: [id number]  HPI  62 year old woman for follow-up of severe COPD  She has done well.  She still has oxygen but rarely uses this.  Oxygen saturation is 94% on room air today. She is compliant with Symbicort and Spiriva and her breathing has mostly stayed stable. We reviewed CT chest from 10/2017-stable nodules and adrenal adenoma  Immunizations are up-to-date. She underwent TAH & BSO for large ovarian mass at [facility], procedure was uneventful and she has recovered well.  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  Significant tests/ events reviewed  11/2014: FVC 2.00 L (63%) FEV1 1.00 L (41%) ratio 50  negative bronchodilator response TLC 5.06 L (103%) RV 140% DLCO uncorrected 51%  10/2016 CT chest showed subcentimeter nodules which have been stable since 2017, right adrenal nodule was stable since 2015 and considered benign 10/2017 CT chest-stable nodules, adrenal adenoma  Review of Systems Patient denies significant dyspnea,cough, hemoptysis,  chest pain, palpitations, pedal edema, orthopnea, paroxysmal nocturnal dyspnea, lightheadedness, nausea, vomiting, abdominal or  leg pains      Objective:   Physical Exam  Gen. Pleasant, well-nourished, in no distress ENT - no thrush, no post nasal drip Neck: No JVD, no thyromegaly, no carotid bruits Lungs: no use of accessory muscles, no dullness to percussion, decresaed BL without rales or rhonchi  Cardiovascular: Rhythm regular, heart sounds  normal, no murmurs or gallops, no peripheral edema Musculoskeletal: No deformities, no cyanosis or clubbing        Assessment & Plan:

## 2018-03-16 NOTE — Assessment & Plan Note (Signed)
She still has oxygen even though she is rarely using this.  I do not doubt that if she has an exacerbation she will need oxygen

## 2018-03-19 ENCOUNTER — Encounter: Payer: 59 | Admitting: Adult Health

## 2018-03-19 ENCOUNTER — Ambulatory Visit (INDEPENDENT_AMBULATORY_CARE_PROVIDER_SITE_OTHER): Payer: 59 | Admitting: Adult Health

## 2018-03-19 ENCOUNTER — Encounter: Payer: Self-pay | Admitting: Adult Health

## 2018-03-19 VITALS — BP 136/84 | Temp 98.1°F | Ht 62.5 in | Wt 127.0 lb

## 2018-03-19 DIAGNOSIS — J449 Chronic obstructive pulmonary disease, unspecified: Secondary | ICD-10-CM

## 2018-03-19 DIAGNOSIS — Z0000 Encounter for general adult medical examination without abnormal findings: Secondary | ICD-10-CM

## 2018-03-19 DIAGNOSIS — E785 Hyperlipidemia, unspecified: Secondary | ICD-10-CM

## 2018-03-19 DIAGNOSIS — F419 Anxiety disorder, unspecified: Secondary | ICD-10-CM

## 2018-03-19 DIAGNOSIS — M159 Polyosteoarthritis, unspecified: Secondary | ICD-10-CM

## 2018-03-19 DIAGNOSIS — F329 Major depressive disorder, single episode, unspecified: Secondary | ICD-10-CM

## 2018-03-19 DIAGNOSIS — M150 Primary generalized (osteo)arthritis: Secondary | ICD-10-CM

## 2018-03-19 DIAGNOSIS — R1032 Left lower quadrant pain: Secondary | ICD-10-CM

## 2018-03-19 MED ORDER — HYDROCODONE-ACETAMINOPHEN 7.5-325 MG PO TABS: 1.0000 | ORAL_TABLET | Freq: Four times a day (QID) | ORAL | 0 refills | Status: DC | PRN

## 2018-03-19 MED ORDER — ALPRAZOLAM 0.5 MG PO TABS: ORAL_TABLET | ORAL | 2 refills | Status: DC

## 2018-03-19 NOTE — Progress Notes (Deleted)
Subjective:    Patient ID: Kim [name], female    DOB: 1955/07/21, 62 y.o.   MRN: [id number]  HPI Patient presents for yearly preventative medicine examination. 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   COPD -he is followed by pulmonary.  Doing well with Symbicort and Spiriva, she uses home oxygen very rarely.  Hyperlipidemia/CAD -followed by cardiology.  Cath in 2017 showed mild CAD.  Reports that she stopped taking Crestor due to muscle cramping  Osteoarthritis -prescribed hydrocodone by previous PCP.  Most of her pain is located to the lumbar and cervical spine.  She also takes hydrocodone when she has COPD flares  Anxiety/Depression - Controlled with Paxil and Xanax 0.5     All immunizations and health maintenance protocols were reviewed with the patient and needed orders were placed.  Appropriate screening laboratory values were ordered for the patient including screening of hyperlipidemia, renal function and hepatic function.  Medication reconciliation,  past medical history, social history, problem list and allergies were reviewed in detail with the patient  Goals were established with regard to weight loss, exercise, and  diet in compliance with medications  End of life planning was discussed.  She is due for routine screening colonoscopy. She has routine vision screens. Does not go to the dentist due to having dentures.   She had a large ovarian mass removed in September 2019 at [facility]  Constitutional: Negative.   HENT: Negative.   Eyes: Negative.   Respiratory: Negative.   Cardiovascular: Negative.   Gastrointestinal: Negative.   Endocrine: Negative.   Genitourinary: Negative.     Musculoskeletal: Negative.   Skin: Negative.   Allergic/Immunologic: Negative.   Neurological: Negative.   Hematological: Negative.   Psychiatric/Behavioral: Negative.    Past Medical History:  Diagnosis Date  . C8 RADICULOPATHY 06/05/2009  . COPD 08/31/2007  . DEPRESSION 11/19/2006  . DYSLIPIDEMIA 03/12/2009  . MI (mitral incompetence)   . 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 OSTEOARTHRITIS 06/05/2009  . PARESTHESIA 08/24/2007  . TOBACCO ABUSE 01/25/2010    Social History   Socioeconomic History  . Marital status: Married    Spouse name: Not on file  . Number of children: 2  . Years of education: Not on file  . Highest education level: Not on file  Occupational History  . Occupation: Patient care aide-sits with elderly lady  Social Needs  . Financial resource strain: Not hard at all  . Food insecurity:    Worry: Never true    Inability: Never true  . Transportation needs:    Medical: No    Non-medical: No  Tobacco Use  . Smoking status: Former Smoker    Packs/day: 2.00    Years: 38.00    Pack years: 76.00    Types: Cigarettes    Last attempt to quit: 03/07/2014    Years since quitting: 4.0  . Smokeless tobacco: Never Used  Substance and Sexual Activity  . Alcohol use: No    Alcohol/week: 0.0 standard drinks  . Drug use: No  . Sexual activity: Not on file  Lifestyle  . Physical activity:    Days per week: 0 days    Minutes per session: 0 min  . Stress: Not at all  Relationships  . Social connections:    Talks on  phone: More than three times a week    Gets together: More than three times a week    Attends religious service: More than 4 times per year    Active member of club or organization: No    Attends meetings of clubs or organizations: Never    Relationship status: Married  . Intimate partner violence:    Fear of current or ex partner: No    Emotionally abused: No    Physically abused: No    Forced sexual activity: No  Other Topics Concern  . Not on file   Social History Narrative   [facility] (01/12/17):   Originally from [facility]. Has lived in [place] for 17 years. Married. Has 2 dogs currently. No mold and only 1 indoor plant. Had her home professionally cleaned a month ago. No bird or hot tub exposure. 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     Past Surgical History:  Procedure Laterality Date  . ABDOMINAL HYSTERECTOMY    . APPENDECTOMY  1969  . CARDIAC CATHETERIZATION N/A 07/05/2015   Procedure: Left Heart Cath and Coronary Angiography;  Surgeon: Leonie Man, MD;  Location: [facility];  Service: Cardiovascular;  Laterality: N/A;  . CHOLECYSTECTOMY  1989  . collapse lung  1990  . EXCISION MASS ABDOMINAL  2019  . EXPLORATORY LAPAROTOMY  1969  . SALPINGOOPHORECTOMY  2019    Family History  Problem Relation Age of Onset  . Cancer Mother        lung  . Heart attack Mother   . Hypertension Sister   . Multiple sclerosis Brother   . Diabetes Sister   . Rheum arthritis Sister   . Thyroid disease Sister   . Multiple sclerosis Other   . Alcohol abuse Other   . Arthritis Other   . Diabetes Other   . Kidney disease Other   . Cancer Other        lung,ovarian,skin, uterine  . Stroke Other   . Heart disease Other   . Melanoma Other   . Heart attack Maternal Uncle   . Heart attack Other        NEICE  . Heart attack Other        NEPHEW  . Hypertension Brother   . Stroke Maternal Grandmother   . Colon cancer Neg Hx     Allergies  Allergen Reactions  . Penicillins Hives    Has patient had a PCN reaction causing immediate rash, facial/tongue/throat swelling, SOB or lightheadedness with hypotension: Yes Has patient had a PCN reaction causing severe rash involving mucus membranes or skin necrosis: No Has patient had a PCN reaction that required hospitalization No Has patient had a PCN reaction occurring within the last 10 years: No If all of the above answers are "NO", then may proceed with  Cephalosporin use.     Current Outpatient Medications on File Prior to Visit  Medication Sig Dispense Refill  . albuterol (PROVENTIL) (2.5 MG/3ML) 0.083% nebulizer solution Take 3 mLs (2.5 mg total) by nebulization every 6 (six) hours as needed for wheezing or shortness of breath. DX: J44.1 75 mL 3  . ALPRAZolam (XANAX) 0.5 MG tablet TAKE 1/2 TO 1 TABLET BY MOUTH 3 TIMES A DAY 60 tablet 2  . aspirin EC 81 MG EC tablet Take 1 tablet (81 mg total) by mouth daily.    . budesonide-formoterol (SYMBICORT) 160-4.5 MCG/ACT inhaler Inhale 2 puffs into the lungs 2 (two) times  daily. 30.6 g 2  . Cholecalciferol (VITAMIN D3) 1000 units CAPS Take 1 capsule (1,000 Units total) by mouth 2 (two) times daily. 60 capsule 6  . furosemide (LASIX) 40 MG tablet Take 1 tablet (40 mg total) daily as needed by mouth (for weight gain of 3 lbs). 30 tablet 6  . HYDROcodone-acetaminophen (NORCO) 7.5-325 MG tablet Take 1 tablet by mouth every 6 (six) hours as needed for moderate pain. 30 tablet 0  . HYDROcodone-acetaminophen (NORCO) 7.5-325 MG tablet Take 1 tablet by mouth every 6 (six) hours as needed for moderate pain. 30 tablet 0  . HYDROcodone-acetaminophen (NORCO) 7.5-325 MG tablet Take 1 tablet by mouth every 6 (six) hours as needed for moderate pain. 30 tablet 0  . metoprolol tartrate (LOPRESSOR) 25 MG tablet Take 0.5 tablets (12.5 mg total) by mouth 2 (two) times daily. 90 tablet 2  . nitroGLYCERIN (NITROSTAT) 0.4 MG SL tablet Place 1 tablet (0.4 mg total) under the tongue every 5 (five) minutes as needed for chest pain. 25 tablet 3  . PARoxetine (PAXIL CR) 25 MG 24 hr tablet Take 1 tablet (25 mg total) by mouth every morning. 30 tablet 0  . rosuvastatin (CRESTOR) 20 MG tablet Take 1 tablet (20 mg total) by mouth daily. 90 tablet 3  . Spacer/Aero Chamber Mouthpiece MISC 1 Device by Does not apply route as directed. 1 each 0  . Tiotropium Bromide Monohydrate (SPIRIVA RESPIMAT) 2.5 MCG/ACT AERS Inhale 2 puffs into the  lungs daily. 1 Inhaler 5  . VENTOLIN HFA 108 (90 Base) MCG/ACT inhaler INHALE 2 PUFFS BY MOUTH INTO THE LUNGS EVERY 4 HOURS AS NEEDED FOR WHEEZING OR SHORTNESS OF BREATH 18 g 0  . vitamin B-12 (CYANOCOBALAMIN) 500 MCG tablet Take 500 mcg by mouth 2 (two) times daily.     No current facility-administered medications on file prior to visit.     There were no vitals taken for this visit.      Objective:   Physical Exam Vitals signs and nursing note reviewed.  Constitutional:      General: She is not in acute distress.    Appearance: Normal appearance. She is well-developed.  HENT:     Head: Normocephalic and atraumatic.     Right Ear: External ear normal.     Left Ear: External ear normal.     Nose: Nose normal.     Mouth/Throat:     Mouth: Mucous membranes are moist.     Pharynx: Oropharynx is clear. No oropharyngeal exudate.  Eyes:     General:        Right eye: No discharge.        Left eye: No discharge.     Extraocular Movements: Extraocular movements intact.     Conjunctiva/sclera: Conjunctivae normal.     Pupils: Pupils are equal, round, and reactive to light.  Neck:     Musculoskeletal: Normal range of motion and neck supple.     Thyroid: No thyromegaly.     Trachea: No tracheal deviation.  Cardiovascular:     Rate and Rhythm: Normal rate and regular rhythm.     Heart sounds: Normal heart sounds. No murmur. No friction rub. No gallop.   Pulmonary:     Effort: Pulmonary effort is normal. No respiratory distress.     Breath sounds: Normal breath sounds. No wheezing or rales.  Chest:     Chest wall: No tenderness.  Abdominal:     General: Bowel sounds are normal. There is no  distension.     Palpations: Abdomen is soft. There is no mass.     Tenderness: There is no abdominal tenderness. There is no right CVA tenderness, left CVA tenderness, guarding or rebound.     Hernia: No hernia is present.  Musculoskeletal: Normal range of motion.  Lymphadenopathy:      Cervical: No cervical adenopathy.  Skin:    General: Skin is warm and dry.     Capillary Refill: Capillary refill takes less than 2 seconds.     Coloration: Skin is not pale.     Findings: No erythema or rash.  Neurological:     Mental Status: She is alert and oriented to person, place, and time.     Cranial Nerves: No cranial nerve deficit.     Coordination: Coordination normal.  Psychiatric:        Behavior: Behavior normal.        Thought Content: Thought content normal.        Judgment: Judgment normal.           Assessment & Plan:

## 2018-03-19 NOTE — Addendum Note (Signed)
Addended by: Apolinar Junes on: 03/19/2018 05:12 PM   Modules accepted: Orders

## 2018-03-19 NOTE — Progress Notes (Signed)
Subjective:    Patient ID: Kim [name], female    DOB: 1956-01-06, 62 y.o.   MRN: [id number]  HPI Patient presents for yearly preventative medicine examination. 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  COPD -she is followed by pulmonary.  Doing well with Symbicort and Spiriva, she uses home oxygen very rarely.  Hyperlipidemia/CAD -followed by cardiology.  Cath in 2017 showed mild CAD. Takes Crestor 20 mg daily ans 81 mg asa  Osteoarthritis -prescribed hydrocodone by previous PCP.  Most of her pain is located to the lumbar and cervical spine.  She also takes hydrocodone when she has COPD flares  Anxiety/Depression - Controlled with Paxil and Xanax 0.5    All immunizations and health maintenance protocols were reviewed with the patient and needed orders were placed.  Appropriate screening laboratory values were ordered for the patient including screening of hyperlipidemia, renal function and hepatic function.  Medication reconciliation,  past medical history, social history, problem list and allergies were reviewed in detail with the patient  Goals were established with regard to weight loss, exercise, and  diet in compliance with medications  End of life planning was discussed.  She is utd on routine colonoscopy, vision screens, mammogram, and pap.    She had a large ovarian mass removed in September 2019 at [facility]. She reports that she continues to have significant pain at times in left lower quadrant since surgical procedure. She denies any redness or warmth. Does not have any issues with bowel movements.    Review of Systems  Constitutional: Negative.   HENT: Negative.   Eyes: Negative.   Respiratory: Positive  for shortness of breath.   Cardiovascular: Negative.   Gastrointestinal: Positive for abdominal pain.  Endocrine: Negative.   Genitourinary: Negative.   Musculoskeletal: Negative.   Skin: Negative.   Allergic/Immunologic: Negative.   Neurological: Negative.   Hematological: Negative.   Psychiatric/Behavioral: Negative.   All other systems reviewed and are negative.  Past Medical History:  Diagnosis Date  . C8 RADICULOPATHY 06/05/2009  . COPD 08/31/2007  . DEPRESSION 11/19/2006  . DYSLIPIDEMIA 03/12/2009  . MI (mitral incompetence)   . 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 OSTEOARTHRITIS 06/05/2009  . PARESTHESIA 08/24/2007  . TOBACCO ABUSE 01/25/2010    Social History   Socioeconomic History  . Marital status: Married    Spouse name: Not on file  . Number of children: 2  . Years of education: Not on file  . Highest education level: Not on file  Occupational History  . Occupation: Patient care aide-sits with elderly lady  Social Needs  . Financial resource strain: Not hard at all  . Food insecurity:    Worry: Never true    Inability: Never true  . Transportation needs:    Medical: No    Non-medical: No  Tobacco Use  . Smoking status: Former Smoker    Packs/day: 2.00    Years: 38.00    Pack years: 76.00    Types: Cigarettes    Last attempt to quit: 03/07/2014    Years since quitting: 4.0  . Smokeless tobacco: Never Used  Substance and Sexual Activity  . Alcohol use: No    Alcohol/week: 0.0 standard drinks  . Drug use: No  . Sexual activity: Not on file  Lifestyle  . Physical activity:  Days per week: 0 days    Minutes per session: 0 min  . Stress: Not at all  Relationships  . Social connections:    Talks on phone: More than three times a week    Gets together: More than three times a week    Attends religious service: More than 4 times per year    Active member of club or organization: No    Attends meetings of clubs or organizations: Never    Relationship status:  Married  . Intimate partner violence:    Fear of current or ex partner: No    Emotionally abused: No    Physically abused: No    Forced sexual activity: No  Other Topics Concern  . Not on file  Social History Narrative   [facility] (01/12/17):   Originally from [place]. Has lived in [place] for 17 years. Married. Has 2 dogs currently. No mold and only 1 indoor plant. Had her home professionally cleaned a month ago. No bird or hot tub exposure. 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     Past Surgical History:  Procedure Laterality Date  . ABDOMINAL HYSTERECTOMY    . APPENDECTOMY  1969  . CARDIAC CATHETERIZATION N/A 07/05/2015   Procedure: Left Heart Cath and Coronary Angiography;  Surgeon: Leonie Man, MD;  Location: [facility];  Service: Cardiovascular;  Laterality: N/A;  . CHOLECYSTECTOMY  1989  . collapse lung  1990  . EXCISION MASS ABDOMINAL  2019  . EXPLORATORY LAPAROTOMY  1969  . SALPINGOOPHORECTOMY  2019    Family History  Problem Relation Age of Onset  . Cancer Mother        lung  . Heart attack Mother   . Hypertension Sister   . Multiple sclerosis Brother   . Diabetes Sister   . Rheum arthritis Sister   . Thyroid disease Sister   . Multiple sclerosis Other   . Alcohol abuse Other   . Arthritis Other   . Diabetes Other   . Kidney disease Other   . Cancer Other        lung,ovarian,skin, uterine  . Stroke Other   . Heart disease Other   . Melanoma Other   . Heart attack Maternal Uncle   . Heart attack Other        NEICE  . Heart attack Other        NEPHEW  . Hypertension Brother   . Stroke Maternal Grandmother   . Colon cancer Neg Hx     Allergies  Allergen Reactions  . Penicillins Hives    Has patient had a PCN reaction causing immediate rash, facial/tongue/throat swelling, SOB or lightheadedness with hypotension: Yes Has patient had a PCN reaction causing severe rash involving mucus membranes or skin necrosis:  No Has patient had a PCN reaction that required hospitalization No Has patient had a PCN reaction occurring within the last 10 years: No If all of the above answers are "NO", then may proceed with Cephalosporin use.     Current Outpatient Medications on File Prior to Visit  Medication Sig Dispense Refill  . albuterol (PROVENTIL) (2.5 MG/3ML) 0.083% nebulizer solution Take 3 mLs (2.5 mg total) by nebulization every 6 (six) hours as needed for wheezing or shortness of breath. DX: J44.1 75 mL 3  . ALPRAZolam (XANAX) 0.5 MG tablet TAKE 1/2 TO 1 TABLET BY MOUTH 3 TIMES A DAY 60 tablet 2  . aspirin EC 81  MG EC tablet Take 1 tablet (81 mg total) by mouth daily.    . budesonide-formoterol (SYMBICORT) 160-4.5 MCG/ACT inhaler Inhale 2 puffs into the lungs 2 (two) times daily. 30.6 g 2  . Cholecalciferol (VITAMIN D3) 1000 units CAPS Take 1 capsule (1,000 Units total) by mouth 2 (two) times daily. 60 capsule 6  . furosemide (LASIX) 40 MG tablet Take 1 tablet (40 mg total) daily as needed by mouth (for weight gain of 3 lbs). 30 tablet 6  . HYDROcodone-acetaminophen (NORCO) 7.5-325 MG tablet Take 1 tablet by mouth every 6 (six) hours as needed for moderate pain. 30 tablet 0  . HYDROcodone-acetaminophen (NORCO) 7.5-325 MG tablet Take 1 tablet by mouth every 6 (six) hours as needed for moderate pain. 30 tablet 0  . HYDROcodone-acetaminophen (NORCO) 7.5-325 MG tablet Take 1 tablet by mouth every 6 (six) hours as needed for moderate pain. 30 tablet 0  . nitroGLYCERIN (NITROSTAT) 0.4 MG SL tablet Place 1 tablet (0.4 mg total) under the tongue every 5 (five) minutes as needed for chest pain. 25 tablet 3  . PARoxetine (PAXIL CR) 25 MG 24 hr tablet Take 1 tablet (25 mg total) by mouth every morning. 30 tablet 0  . rosuvastatin (CRESTOR) 20 MG tablet Take 1 tablet (20 mg total) by mouth daily. 90 tablet 3  . Spacer/Aero Chamber Mouthpiece MISC 1 Device by Does not apply route as directed. 1 each 0  . Tiotropium  Bromide Monohydrate (SPIRIVA RESPIMAT) 2.5 MCG/ACT AERS Inhale 2 puffs into the lungs daily. 1 Inhaler 5  . VENTOLIN HFA 108 (90 Base) MCG/ACT inhaler INHALE 2 PUFFS BY MOUTH INTO THE LUNGS EVERY 4 HOURS AS NEEDED FOR WHEEZING OR SHORTNESS OF BREATH 18 g 0  . vitamin B-12 (CYANOCOBALAMIN) 500 MCG tablet Take 500 mcg by mouth 2 (two) times daily.    . metoprolol tartrate (LOPRESSOR) 25 MG tablet Take 0.5 tablets (12.5 mg total) by mouth 2 (two) times daily. 90 tablet 2   No current facility-administered medications on file prior to visit.     BP 136/84   Temp 98.1 F (36.7 C)   Ht 5' 2.5" (1.588 m)   Wt 127 lb (57.6 kg)   BMI 22.86 kg/m       Objective:   Physical Exam Vitals signs and nursing note reviewed.  Constitutional:      General: She is not in acute distress.    Appearance: Normal appearance.  HENT:     Head: Normocephalic and atraumatic.     Right Ear: Tympanic membrane, ear canal and external ear normal.     Left Ear: Tympanic membrane, ear canal and external ear normal.     Nose: Nose normal. No congestion or rhinorrhea.     Mouth/Throat:     Mouth: Mucous membranes are moist.     Dentition: Has dentures.     Pharynx: Oropharynx is clear.  Eyes:     Extraocular Movements: Extraocular movements intact.     Pupils: Pupils are equal, round, and reactive to light.  Cardiovascular:     Rate and Rhythm: Normal rate and regular rhythm.  Pulmonary:     Effort: Pulmonary effort is normal.     Breath sounds: Normal breath sounds.  Abdominal:     General: Abdomen is flat. Bowel sounds are normal. There is no distension.     Palpations: Abdomen is soft. There is no mass.     Tenderness: There is abdominal tenderness in the left lower quadrant. There  is no guarding or rebound.     Hernia: No hernia is present.  Skin:    General: Skin is warm and dry.     Capillary Refill: Capillary refill takes less than 2 seconds.  Neurological:     General: No focal deficit present.       Mental Status: She is alert and oriented to person, place, and time.  Psychiatric:        Mood and Affect: Mood normal.        Behavior: Behavior normal.        Thought Content: Thought content normal.        Judgment: Judgment normal.        Assessment & Plan:  1. Routine general medical examination at a health care facility - Encouraged heart healthy diet and exercise  - Follow up in one year or sooner if needed - Basic metabolic panel; Future - CBC with Differential/Platelet; Future - Hemoglobin A1c; Future - Hepatic function panel; Future - Lipid panel; Future - TSH; Future  2. Primary osteoarthritis involving multiple joints - Follow up in 3 months  - HYDROcodone-acetaminophen (NORCO) 7.5-325 MG tablet; Take 1 tablet by mouth every 6 (six) hours as needed for moderate pain.  Dispense: 30 tablet; Refill: 0 - HYDROcodone-acetaminophen (NORCO) 7.5-325 MG tablet; Take 1 tablet by mouth every 6 (six) hours as needed for moderate pain.  Dispense: 30 tablet; Refill: 0 - HYDROcodone-acetaminophen (NORCO) 7.5-325 MG tablet; Take 1 tablet by mouth every 6 (six) hours as needed for moderate pain.  Dispense: 30 tablet; Refill: 0 - Pain Mgmt, Profile 8 w/Conf, U; Future - Vitamin D, 25-hydroxy; Future  3. Hyperlipidemia, unspecified hyperlipidemia type Continue with cardiology plan of care - Basic metabolic panel; Future - CBC with Differential/Platelet; Future - Hemoglobin A1c; Future - Hepatic function panel; Future - Lipid panel; Future - TSH; Future  4. Anxiety and depression - Well managed. Continue with current treatment  - ALPRAZolam (XANAX) 0.5 MG tablet; TAKE 1/2 TO 1 TABLET BY MOUTH 3 TIMES A DAY  Dispense: 60 tablet; Refill: 2  5. COPD GOLD III  - Continue with pulmonary plan of care  [name], NP

## 2018-03-22 ENCOUNTER — Other Ambulatory Visit (INDEPENDENT_AMBULATORY_CARE_PROVIDER_SITE_OTHER): Payer: 59

## 2018-03-22 DIAGNOSIS — M159 Polyosteoarthritis, unspecified: Secondary | ICD-10-CM

## 2018-03-22 DIAGNOSIS — M150 Primary generalized (osteo)arthritis: Secondary | ICD-10-CM

## 2018-03-22 DIAGNOSIS — E785 Hyperlipidemia, unspecified: Secondary | ICD-10-CM | POA: Diagnosis not present

## 2018-03-22 DIAGNOSIS — Z0000 Encounter for general adult medical examination without abnormal findings: Secondary | ICD-10-CM | POA: Diagnosis not present

## 2018-03-22 LAB — CBC WITH DIFFERENTIAL/PLATELET
Basophils Absolute: 0.1 10*3/uL (ref 0.0–0.1)
Basophils Relative: 0.9 % (ref 0.0–3.0)
EOS ABS: 0.3 10*3/uL (ref 0.0–0.7)
Eosinophils Relative: 3.1 % (ref 0.0–5.0)
HCT: 41.6 % (ref 36.0–46.0)
Hemoglobin: 13.7 g/dL (ref 12.0–15.0)
Lymphocytes Relative: 54.9 % — ABNORMAL HIGH (ref 12.0–46.0)
Lymphs Abs: 5 10*3/uL — ABNORMAL HIGH (ref 0.7–4.0)
MCHC: 33 g/dL (ref 30.0–36.0)
MCV: 90.8 fl (ref 78.0–100.0)
Monocytes Absolute: 0.7 10*3/uL (ref 0.1–1.0)
Monocytes Relative: 7.5 % (ref 3.0–12.0)
NEUTROS PCT: 33.6 % — AB (ref 43.0–77.0)
Neutro Abs: 3.1 10*3/uL (ref 1.4–7.7)
Platelets: 292 10*3/uL (ref 150.0–400.0)
RBC: 4.58 Mil/uL (ref 3.87–5.11)
RDW: 15.1 % (ref 11.5–15.5)
WBC: 9.2 10*3/uL (ref 4.0–10.5)

## 2018-03-22 LAB — HEPATIC FUNCTION PANEL
ALT: 12 U/L (ref 0–35)
AST: 14 U/L (ref 0–37)
Albumin: 4.2 g/dL (ref 3.5–5.2)
Alkaline Phosphatase: 60 U/L (ref 39–117)
Bilirubin, Direct: 0.1 mg/dL (ref 0.0–0.3)
Total Bilirubin: 0.2 mg/dL (ref 0.2–1.2)
Total Protein: 6.2 g/dL (ref 6.0–8.3)

## 2018-03-22 LAB — BASIC METABOLIC PANEL
BUN: 18 mg/dL (ref 6–23)
CO2: 29 mEq/L (ref 19–32)
Calcium: 9.3 mg/dL (ref 8.4–10.5)
Chloride: 106 mEq/L (ref 96–112)
Creatinine, Ser: 0.79 mg/dL (ref 0.40–1.20)
GFR: 77.84 mL/min (ref >60.00)
Glucose, Bld: 93 mg/dL (ref 70–99)
Potassium: 4.3 mEq/L (ref 3.5–5.1)
Sodium: 142 mEq/L (ref 135–145)

## 2018-03-22 LAB — LIPID PANEL
Cholesterol: 132 mg/dL (ref 0–200)
HDL: 48.7 mg/dL (ref >39.00)
LDL Cholesterol: 69 mg/dL (ref 0–99)
NonHDL: 83.06
TRIGLYCERIDES: 71 mg/dL (ref 0.0–149.0)
Total CHOL/HDL Ratio: 3
VLDL: 14.2 mg/dL (ref 0.0–40.0)

## 2018-03-22 LAB — HEMOGLOBIN A1C: Hgb A1c MFr Bld: 6 % (ref 4.6–6.5)

## 2018-03-22 LAB — TSH: TSH: 3.11 u[IU]/mL (ref 0.35–4.50)

## 2018-03-22 LAB — VITAMIN D 25 HYDROXY (VIT D DEFICIENCY, FRACTURES): VITD: 29.64 ng/mL — ABNORMAL LOW (ref 30.00–100.00)

## 2018-03-26 LAB — PAIN MGMT, PROFILE 8 W/CONF, U
6 Acetylmorphine: NEGATIVE ng/mL (ref <10)
Alcohol Metabolites: NEGATIVE ng/mL (ref <500)
Alphahydroxyalprazolam: 276 ng/mL — ABNORMAL HIGH (ref <25)
Alphahydroxymidazolam: NEGATIVE ng/mL (ref <50)
Alphahydroxytriazolam: NEGATIVE ng/mL (ref <50)
Aminoclonazepam: NEGATIVE ng/mL (ref <25)
Amphetamines: NEGATIVE ng/mL (ref <500)
BUPRENORPHINE, URINE: NEGATIVE ng/mL (ref <5)
Benzodiazepines: POSITIVE ng/mL — AB (ref <100)
Cocaine Metabolite: NEGATIVE ng/mL (ref <150)
Codeine: NEGATIVE ng/mL (ref <50)
Creatinine: 197.7 mg/dL
Hydrocodone: 1616 ng/mL — ABNORMAL HIGH (ref <50)
Hydromorphone: 144 ng/mL — ABNORMAL HIGH (ref <50)
Hydroxyethylflurazepam: NEGATIVE ng/mL (ref <50)
Lorazepam: NEGATIVE ng/mL (ref <50)
MARIJUANA METABOLITE: NEGATIVE ng/mL (ref <20)
MDMA: NEGATIVE ng/mL (ref <500)
Morphine: NEGATIVE ng/mL (ref <50)
Nordiazepam: NEGATIVE ng/mL (ref <50)
Norhydrocodone: 2307 ng/mL — ABNORMAL HIGH (ref <50)
OPIATES: POSITIVE ng/mL — AB (ref <100)
Oxazepam: NEGATIVE ng/mL (ref <50)
Oxidant: NEGATIVE ug/mL (ref <200)
Oxycodone: NEGATIVE ng/mL (ref <100)
Temazepam: NEGATIVE ng/mL (ref <50)
pH: 5.59 (ref 4.5–9.0)

## 2018-03-30 ENCOUNTER — Ambulatory Visit
Admission: RE | Admit: 2018-03-30 | Discharge: 2018-03-30 | Disposition: A | Discharge status: Home / Self Care | Payer: 59 | Source: Ambulatory Visit | Attending: Adult Health | Admitting: Adult Health

## 2018-03-30 DIAGNOSIS — R1032 Left lower quadrant pain: Secondary | ICD-10-CM

## 2018-03-30 MED ORDER — IOPAMIDOL (ISOVUE-300) INJECTION 61%
100.0000 mL | Freq: Once | INTRAVENOUS | Status: AC | PRN
  Administered 2018-03-30: 100 mL via INTRAVENOUS

## 2018-04-12 ENCOUNTER — Ambulatory Visit: Payer: Self-pay

## 2018-04-12 NOTE — Telephone Encounter (Signed)
Pt c/o constipation. Pt stated that she has had constipation since September. Pt has been taking Miralax three times per day and took Castor oil. Pt stated that she has small BM's every day, but the pt stated it never feels like she is emptying her bowels. Pt is having nausea. Pt c/o abdominal pain and stated she had an xray (03/30/18) and talked to her surgeon about it. Pt is taking hydrocodone every six hours as needed for pain for her arthritis. Pt wanting to try home care interventions first and stated that she will call back for an appointment if home care measures do not work. Care advice given and pt verbalized understanding.  Reason for Disposition . Constipation is a chronic symptom (recurrent or ongoing AND present > 4 weeks)  Answer Assessment - Initial Assessment Questions 1. STOOL PATTERN OR FREQUENCY: "How often do you pass bowel movements (BMs)?"  (Normal range: tid to q 3 days)  "When was the last BM passed?"       Tiny amount 1 time a day. Feel like not emptying out bowels 2. STRAINING: "Do you have to strain to have a BM?"      no 3. RECTAL PAIN: "Does your rectum hurt when the stool comes out?" If so, ask: "Do you have hemorrhoids? How bad is the pain?"  (Scale 1-10; or mild, moderate, severe)     no 4. STOOL COMPOSITION: "Are the stools hard?"      Pasty  5. BLOOD ON STOOLS: "Has there been any blood on the toilet tissue or on the surface of the BM?" If so, ask: "When was the last time?"      no 6. CHRONIC CONSTIPATION: "Is this a new problem for you?"  If no, ask: "How long have you had this problem?" (days, weeks, months)      No been a problem since surgery in September 7. CHANGES IN DIET: "Have there been any recent changes in your diet?"      no 8. MEDICATIONS: "Have you been taking any new medications?"     miralax and castor oil 9. LAXATIVES: "Have you been using any laxatives or enemas?"  If yes, ask "What, how often, and when was the last time?"     miralax castor  oil 10. CAUSE: "What do you think is causing the constipation?"        Pt doesn't know-- left side sore to touch 11. OTHER SYMPTOMS: "Do you have any other symptoms?" (e.g., abdominal pain, fever, vomiting)       Abdominal pain, nausea 12. PREGNANCY: "Is there any chance you are pregnant?" "When was your last menstrual period?"       n/a  Protocols used: CONSTIPATION-A-AH

## 2018-04-20 ENCOUNTER — Telehealth: Payer: Self-pay | Admitting: Internal Medicine

## 2018-04-20 NOTE — Telephone Encounter (Signed)
Pt had surgery in September and since that surgery she has been having problems with constipation and stomach pain. Pt scheduled to see Dr. Henrene Pastor 05/06/18@10am . Pt aware of appt.

## 2018-04-20 NOTE — Telephone Encounter (Signed)
Pt had surgery 9/24.  Per pt: they found a mass in the abdomen attached to the colon.  Pt has been having painful BMs since.  Pt is due for recall colon and would like a CB to advise whether she needs to schedule an OV.

## 2018-05-06 ENCOUNTER — Encounter: Payer: Self-pay | Admitting: Internal Medicine

## 2018-05-06 ENCOUNTER — Ambulatory Visit: Payer: 59 | Admitting: Internal Medicine

## 2018-05-06 VITALS — BP 150/78 | HR 82 | Ht 63.0 in | Wt 132.0 lb

## 2018-05-06 DIAGNOSIS — K5900 Constipation, unspecified: Secondary | ICD-10-CM | POA: Diagnosis not present

## 2018-05-06 DIAGNOSIS — R1032 Left lower quadrant pain: Secondary | ICD-10-CM | POA: Diagnosis not present

## 2018-05-06 DIAGNOSIS — Z86010 Personal history of colonic polyps: Secondary | ICD-10-CM

## 2018-05-06 MED ORDER — NA SULFATE-K SULFATE-MG SULF 17.5-3.13-1.6 GM/177ML PO SOLN: 1.0000 | Freq: Once | ORAL | 0 refills | Status: AC

## 2018-05-06 NOTE — Progress Notes (Signed)
HISTORY OF PRESENT ILLNESS:  Kim [name] is a 63 y.o. female with severe COPD last seen in this office March 24, 2013 when she underwent routine screening colonoscopy in the [facility].  She was found to have 3 diminutive polyps which were removed.  These were tubular adenomas.  Follow-up in 5 years recommended.  The patient is referred today by [name] regarding persistent left lower quadrant pain and constipation.  The patient was found to have an abnormality of the right ovary for which she underwent total abdominal hysterectomy with bilateral salpingo-oophorectomy with Dr. Skeet [name] December 29, 2017.  Operative reports and pathology reports personally reviewed.  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  The patient tells me that after returning home from the hospital she had new issues with significant focal left lower quadrant pain.  Also issues with constipation.  Initially this was felt to be standard postoperative issues.  For constipation the patient has been taking MiraLAX 1.5 capfuls twice daily as well as milk of magnesia 2-3 times per week.  She typically has a small daily bowel movement.  She does have hydrocodone but uses this quite rarely.  Because of persistent problems with pain she was evaluated by her PCP.  Laboratories included comprehensive metabolic panel, and CBC with differential.  These were normal as was TSH and hemoglobin A1c.  A contrast-enhanced CT scan of the abdomen and pelvis was performed March 30, 2018.  No acute abnormality of the abdomen or pelvis was identified.  Incidental diverticulosis noted.  The patient tells me that the discomfort is exacerbated by certain movements including bending forward.  There is no effect with meals.  It is unclear that good bowel movements improve the discomfort.  She does experience this discomfort on a daily basis with the intensity waxing and waning.  Least problematic when she is  sitting still.  She no longer smokes.  She is accompanied today by her husband  REVIEW OF SYSTEMS:  All non-GI ROS negative unless otherwise stated in the HPI except for anxiety, skin rash, sore throat  Past Medical History:  Diagnosis Date  . C8 RADICULOPATHY 06/05/2009  . COPD 08/31/2007  . DEPRESSION 11/19/2006  . DYSLIPIDEMIA 03/12/2009  . MI (mitral incompetence)   . 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 OSTEOARTHRITIS 06/05/2009  . PARESTHESIA 08/24/2007  . TOBACCO ABUSE 01/25/2010    Past Surgical History:  Procedure Laterality Date  . ABDOMINAL HYSTERECTOMY    . APPENDECTOMY  1969  . CARDIAC CATHETERIZATION N/A 07/05/2015   Procedure: Left Heart Cath and Coronary Angiography;  Surgeon: Leonie Man, MD;  Location: [facility];  Service: Cardiovascular;  Laterality: N/A;  . CHOLECYSTECTOMY  1989  . collapse lung  1990  . EXCISION MASS ABDOMINAL  2019  . EXPLORATORY LAPAROTOMY  1969  . SALPINGOOPHORECTOMY  2019    Social History Kim [name]  reports that she quit smoking about 4 years ago. Her smoking use included cigarettes. She has a 76.00 pack-year smoking history. She has never used smokeless tobacco. She reports that she does not drink alcohol or use drugs.  family history includes Alcohol abuse in an other family member; Arthritis in an other family member; Cancer in her mother and another family member; Diabetes in her sister and another family member; Heart attack in her maternal uncle, mother, and other family members; Heart disease in an other family member; Hypertension in her brother and sister; Kidney disease in an other  family member; Melanoma in an other family member; Multiple sclerosis in her brother and another family member; Rheum arthritis in her sister; Stroke in her maternal grandmother and another family member; Thyroid disease in her sister.  Allergies  Allergen Reactions  . Penicillins Hives    Has patient had a PCN reaction causing immediate rash,  facial/tongue/throat swelling, SOB or lightheadedness with hypotension: Yes Has patient had a PCN reaction causing severe rash involving mucus membranes or skin necrosis: No Has patient had a PCN reaction that required hospitalization No Has patient had a PCN reaction occurring within the last 10 years: No If all of the above answers are "NO", then may proceed with Cephalosporin use.        PHYSICAL EXAMINATION: Vital signs: BP (!) 150/78   Pulse 82   Ht 5\' 3"  (1.6 m)   Wt 132 lb (59.9 kg)   BMI 23.38 kg/m   Constitutional: generally well-appearing, no acute distress Psychiatric: alert and oriented x3, cooperative Eyes: extraocular movements intact, anicteric, conjunctiva pink Mouth: oral pharynx moist, no lesions Neck: supple no lymphadenopathy Cardiovascular: heart regular rate and rhythm, no murmur Lungs: clear to auscultation bilaterally.  No wheezing Abdomen: soft, focal tenderness in the left lower quadrant without mass, nondistended, no obvious ascites, no peritoneal signs, normal bowel sounds, no organomegaly Rectal: Deferred until colonoscopy Extremities: no pain, cyanosis, or lower extremity edema bilaterally Skin: no lesions on visible extremities Neuro: No focal deficits.  Cranial nerves intact  ASSESSMENT:  1.  Persistent focal left lower quadrant pain exacerbated by movements since her surgery.  Negative CT scan in December.  Colonoscopy 5 years ago with diminutive polyps only.  Suspect that her pain is somehow related to her surgery.  Possibly neural entrapment or adhesions.  No significant complication suspected.  One certainly could have pain related to constipation, though it does not appear to me that even good bowel movements result in significant relief. 2.  Constipation.  Postoperative, pain medications, change in activity 3.  History of diminutive colon polyps December 2014.  Due for surveillance 4.  History of severe COPD occasionally requiring oxygen though  not using recently.  Reformed smoker   PLAN:  1.  Continue anticonstipation agents.  Titrate to need 2.  Schedule colonoscopy to evaluate pain, change in bowel habits, and provide colorectal neoplasia surveillance.  The patient is HIGH RISK given her intrinsic lung disease.  As such, her procedure will be performed at the hospital with monitored anesthesia care.The nature of the procedure, as well as the risks, benefits, and alternatives were carefully and thoroughly reviewed with the patient. Ample time for discussion and questions allowed. The patient understood, was satisfied, and agreed to proceed. 3.  Additional recommendations pending the outcome of colonoscopy  A copy of this dictation has been sent to [name] and Dr. Skeet [name]

## 2018-05-06 NOTE — H&P (View-Only) (Signed)
HISTORY OF PRESENT ILLNESS:  Kim [name] is a 63 y.o. female with severe COPD last seen in this office March 24, 2013 when she underwent routine screening colonoscopy in the [facility].  She was found to have 3 diminutive polyps which were removed.  These were tubular adenomas.  Follow-up in 5 years recommended.  The patient is referred today by Mr. Nafziger regarding persistent left lower quadrant pain and constipation.  The patient was found to have an abnormality of the right ovary for which she underwent total abdominal hysterectomy with bilateral salpingo-oophorectomy with Dr. Skeet Latch December 29, 2017.  Operative reports and pathology reports personally reviewed.  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  The patient tells me that after returning home from the hospital she had new issues with significant focal left lower quadrant pain.  Also issues with constipation.  Initially this was felt to be standard postoperative issues.  For constipation the patient has been taking MiraLAX 1.5 capfuls twice daily as well as milk of magnesia 2-3 times per week.  She typically has a small daily bowel movement.  She does have hydrocodone but uses this quite rarely.  Because of persistent problems with pain she was evaluated by her PCP.  Laboratories included comprehensive metabolic panel, and CBC with differential.  These were normal as was TSH and hemoglobin A1c.  A contrast-enhanced CT scan of the abdomen and pelvis was performed March 30, 2018.  No acute abnormality of the abdomen or pelvis was identified.  Incidental diverticulosis noted.  The patient tells me that the discomfort is exacerbated by certain movements including bending forward.  There is no effect with meals.  It is unclear that good bowel movements improve the discomfort.  She does experience this discomfort on a daily basis with the intensity waxing and waning.  Least problematic when she is  sitting still.  She no longer smokes.  She is accompanied today by her husband  REVIEW OF SYSTEMS:  All non-GI ROS negative unless otherwise stated in the HPI except for anxiety, skin rash, sore throat  Past Medical History:  Diagnosis Date  . C8 RADICULOPATHY 06/05/2009  . COPD 08/31/2007  . DEPRESSION 11/19/2006  . DYSLIPIDEMIA 03/12/2009  . MI (mitral incompetence)   . 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 OSTEOARTHRITIS 06/05/2009  . PARESTHESIA 08/24/2007  . TOBACCO ABUSE 01/25/2010    Past Surgical History:  Procedure Laterality Date  . ABDOMINAL HYSTERECTOMY    . APPENDECTOMY  1969  . CARDIAC CATHETERIZATION N/A 07/05/2015   Procedure: Left Heart Cath and Coronary Angiography;  Surgeon: Leonie Man, MD;  Location: [facility];  Service: Cardiovascular;  Laterality: N/A;  . CHOLECYSTECTOMY  1989  . collapse lung  1990  . EXCISION MASS ABDOMINAL  2019  . EXPLORATORY LAPAROTOMY  1969  . SALPINGOOPHORECTOMY  2019    Social History Kim [name]  reports that she quit smoking about 4 years ago. Her smoking use included cigarettes. She has a 76.00 pack-year smoking history. She has never used smokeless tobacco. She reports that she does not drink alcohol or use drugs.  family history includes Alcohol abuse in an other family member; Arthritis in an other family member; Cancer in her mother and another family member; Diabetes in her sister and another family member; Heart attack in her maternal uncle, mother, and other family members; Heart disease in an other family member; Hypertension in her brother and sister; Kidney disease in an other  family member; Melanoma in an other family member; Multiple sclerosis in her brother and another family member; Rheum arthritis in her sister; Stroke in her maternal grandmother and another family member; Thyroid disease in her sister.  Allergies  Allergen Reactions  . Penicillins Hives    Has patient had a PCN reaction causing immediate rash,  facial/tongue/throat swelling, SOB or lightheadedness with hypotension: Yes Has patient had a PCN reaction causing severe rash involving mucus membranes or skin necrosis: No Has patient had a PCN reaction that required hospitalization No Has patient had a PCN reaction occurring within the last 10 years: No If all of the above answers are "NO", then may proceed with Cephalosporin use.        PHYSICAL EXAMINATION: Vital signs: BP (!) 150/78   Pulse 82   Ht 5\' 3"  (1.6 m)   Wt 132 lb (59.9 kg)   BMI 23.38 kg/m   Constitutional: generally well-appearing, no acute distress Psychiatric: alert and oriented x3, cooperative Eyes: extraocular movements intact, anicteric, conjunctiva pink Mouth: oral pharynx moist, no lesions Neck: supple no lymphadenopathy Cardiovascular: heart regular rate and rhythm, no murmur Lungs: clear to auscultation bilaterally.  No wheezing Abdomen: soft, focal tenderness in the left lower quadrant without mass, nondistended, no obvious ascites, no peritoneal signs, normal bowel sounds, no organomegaly Rectal: Deferred until colonoscopy Extremities: no pain, cyanosis, or lower extremity edema bilaterally Skin: no lesions on visible extremities Neuro: No focal deficits.  Cranial nerves intact  ASSESSMENT:  1.  Persistent focal left lower quadrant pain exacerbated by movements since her surgery.  Negative CT scan in December.  Colonoscopy 5 years ago with diminutive polyps only.  Suspect that her pain is somehow related to her surgery.  Possibly neural entrapment or adhesions.  No significant complication suspected.  One certainly could have pain related to constipation, though it does not appear to me that even good bowel movements result in significant relief. 2.  Constipation.  Postoperative, pain medications, change in activity 3.  History of diminutive colon polyps December 2014.  Due for surveillance 4.  History of severe COPD occasionally requiring oxygen though  not using recently.  Reformed smoker   PLAN:  1.  Continue anticonstipation agents.  Titrate to need 2.  Schedule colonoscopy to evaluate pain, change in bowel habits, and provide colorectal neoplasia surveillance.  The patient is HIGH RISK given her intrinsic lung disease.  As such, her procedure will be performed at the hospital with monitored anesthesia care.The nature of the procedure, as well as the risks, benefits, and alternatives were carefully and thoroughly reviewed with the patient. Ample time for discussion and questions allowed. The patient understood, was satisfied, and agreed to proceed. 3.  Additional recommendations pending the outcome of colonoscopy  A copy of this dictation has been sent to Mr. Nafziger and Dr. Skeet Latch

## 2018-05-06 NOTE — Patient Instructions (Signed)

## 2018-05-14 ENCOUNTER — Other Ambulatory Visit: Payer: Self-pay

## 2018-05-14 ENCOUNTER — Ambulatory Visit (HOSPITAL_COMMUNITY)
Admission: RE | Admit: 2018-05-14 | Discharge: 2018-05-14 | Disposition: A | Discharge status: Home / Self Care | Payer: 59 | Attending: Internal Medicine | Admitting: Internal Medicine

## 2018-05-14 ENCOUNTER — Encounter (HOSPITAL_COMMUNITY)
Admission: RE | Disposition: A | Discharge status: Home / Self Care | Payer: Self-pay | Source: Home / Self Care | Attending: Internal Medicine

## 2018-05-14 ENCOUNTER — Ambulatory Visit (HOSPITAL_COMMUNITY): Payer: 59 | Admitting: Anesthesiology

## 2018-05-14 ENCOUNTER — Encounter (HOSPITAL_COMMUNITY): Payer: Self-pay | Admitting: *Deleted

## 2018-05-14 DIAGNOSIS — Z860100 Personal history of colon polyps, unspecified: Secondary | ICD-10-CM

## 2018-05-14 DIAGNOSIS — R1032 Left lower quadrant pain: Secondary | ICD-10-CM | POA: Diagnosis not present

## 2018-05-14 DIAGNOSIS — Z8249 Family history of ischemic heart disease and other diseases of the circulatory system: Secondary | ICD-10-CM | POA: Diagnosis not present

## 2018-05-14 DIAGNOSIS — I2510 Atherosclerotic heart disease of native coronary artery without angina pectoris: Secondary | ICD-10-CM | POA: Diagnosis not present

## 2018-05-14 DIAGNOSIS — Z880 Allergy status to penicillin: Secondary | ICD-10-CM | POA: Diagnosis not present

## 2018-05-14 DIAGNOSIS — Z90710 Acquired absence of both cervix and uterus: Secondary | ICD-10-CM | POA: Insufficient documentation

## 2018-05-14 DIAGNOSIS — K5900 Constipation, unspecified: Secondary | ICD-10-CM | POA: Insufficient documentation

## 2018-05-14 DIAGNOSIS — Z87891 Personal history of nicotine dependence: Secondary | ICD-10-CM | POA: Insufficient documentation

## 2018-05-14 DIAGNOSIS — J449 Chronic obstructive pulmonary disease, unspecified: Secondary | ICD-10-CM | POA: Diagnosis not present

## 2018-05-14 DIAGNOSIS — D122 Benign neoplasm of ascending colon: Secondary | ICD-10-CM

## 2018-05-14 HISTORY — PX: COLONOSCOPY WITH PROPOFOL: SHX5780

## 2018-05-14 HISTORY — PX: POLYPECTOMY: SHX5525

## 2018-05-14 SURGERY — COLONOSCOPY WITH PROPOFOL
Anesthesia: Monitor Anesthesia Care

## 2018-05-14 MED ORDER — SODIUM CHLORIDE 0.9 % IV SOLN: INTRAVENOUS | Status: DC

## 2018-05-14 MED ORDER — LACTATED RINGERS IV SOLN
INTRAVENOUS | Status: DC
  Administered 2018-05-14: 08:00:00 via INTRAVENOUS

## 2018-05-14 MED ORDER — PROPOFOL 500 MG/50ML IV EMUL
INTRAVENOUS | Status: DC | PRN
  Administered 2018-05-14: 30 mg via INTRAVENOUS

## 2018-05-14 MED ORDER — PROPOFOL 10 MG/ML IV BOLUS
INTRAVENOUS | Status: AC
  Filled 2018-05-14: qty 40

## 2018-05-14 MED ORDER — GLYCOPYRROLATE 0.2 MG/ML IJ SOLN
INTRAMUSCULAR | Status: DC | PRN
  Administered 2018-05-14: 0.2 mg via INTRAVENOUS

## 2018-05-14 MED ORDER — ONDANSETRON HCL 4 MG/2ML IJ SOLN
INTRAMUSCULAR | Status: DC | PRN
  Administered 2018-05-14: 4 mg via INTRAVENOUS

## 2018-05-14 MED ORDER — PROPOFOL 500 MG/50ML IV EMUL
INTRAVENOUS | Status: DC | PRN
  Administered 2018-05-14: 100 ug/kg/min via INTRAVENOUS

## 2018-05-14 SURGICAL SUPPLY — 22 items

## 2018-05-14 NOTE — Anesthesia Procedure Notes (Signed)
Date/Time: 05/14/2018 8:38 AM Performed by: Glory Buff, CRNA Oxygen Delivery Method: Simple face mask

## 2018-05-14 NOTE — Anesthesia Postprocedure Evaluation (Signed)
Anesthesia Post Note  Patient: Kim [name]  Procedure(s) Performed: COLONOSCOPY WITH PROPOFOL (N/A ) POLYPECTOMY     Patient location during evaluation: Endoscopy Anesthesia Type: MAC Level of consciousness: awake Pain management: pain level controlled Vital Signs Assessment: post-procedure vital signs reviewed and stable Respiratory status: spontaneous breathing Cardiovascular status: stable Postop Assessment: no apparent nausea or vomiting Anesthetic complications: no    Last Vitals:  Vitals:   05/14/18 0920 05/14/18 0930  BP: (!) 141/65 123/83  Pulse: 70 70  Resp: 19 17  Temp:    SpO2: 100% 100%    Last Pain:  Vitals:   05/14/18 0930  TempSrc:   PainSc: 0-No pain   Pain Goal:                   Huston Foley

## 2018-05-14 NOTE — Interval H&P Note (Signed)
History and Physical Interval Note:  05/14/2018 8:29 AM  Kim [name]  has presented today for surgery, with the diagnosis of hx colon polyps; constipation; LLQ pain  The various methods of treatment have been discussed with the patient and family. After consideration of risks, benefits and other options for treatment, the patient has consented to  Procedure(s): COLONOSCOPY WITH PROPOFOL (N/A) as a surgical intervention .  The patient's history has been reviewed, patient examined, no change in status, stable for surgery.  I have reviewed the patient's chart and labs.  Questions were answered to the patient's satisfaction.     Scarlette Shorts

## 2018-05-14 NOTE — Discharge Instructions (Signed)
YOU HAD AN ENDOSCOPIC PROCEDURE TODAY: Refer to the procedure report and other information in the discharge instructions given to you for any specific questions about what was found during the examination. If this information does not answer your questions, please call [place] office at [phone] to clarify.  ° °YOU SHOULD EXPECT: Some feelings of bloating in the abdomen. Passage of more gas than usual. Walking can help get rid of the air that was put into your GI tract during the procedure and reduce the bloating. If you had a lower endoscopy (such as a colonoscopy or flexible sigmoidoscopy) you may notice spotting of blood in your stool or on the toilet paper. Some abdominal soreness may be present for a day or two, also. ° °DIET: Your first meal following the procedure should be a light meal and then it is ok to progress to your normal diet. A half-sandwich or bowl of soup is an example of a good first meal. Heavy or fried foods are harder to digest and may make you feel nauseous or bloated. Drink plenty of fluids but you should avoid alcoholic beverages for 24 hours. If you had a esophageal dilation, please see attached instructions for diet.   ° °ACTIVITY: Your care partner should take you home directly after the procedure. You should plan to take it easy, moving slowly for the rest of the day. You can resume normal activity the day after the procedure however YOU SHOULD NOT DRIVE, use power tools, machinery or perform tasks that involve climbing or major physical exertion for 24 hours (because of the sedation medicines used during the test).  ° °SYMPTOMS TO REPORT IMMEDIATELY: °A gastroenterologist can be reached at any hour. Please call [phone]  for any of the following symptoms:  °Following lower endoscopy (colonoscopy, flexible sigmoidoscopy) °Excessive amounts of blood in the stool  °Significant tenderness, worsening of abdominal pains  °Swelling of the abdomen that is new, acute  °Fever of 100° or  higher  °Following upper endoscopy (EGD, EUS, ERCP, esophageal dilation) °Vomiting of blood or coffee ground material  °New, significant abdominal pain  °New, significant chest pain or pain under the shoulder blades  °Painful or persistently difficult swallowing  °New shortness of breath  °Black, tarry-looking or red, bloody stools ° °FOLLOW UP:  °If any biopsies were taken you will be contacted by phone or by letter within the next 1-3 weeks. Call [phone]  if you have not heard about the biopsies in 3 weeks.  °Please also call with any specific questions about appointments or follow up tests. ° °

## 2018-05-14 NOTE — Anesthesia Preprocedure Evaluation (Addendum)
Anesthesia Evaluation  Patient identified by MRN, date of birth, ID band Patient awake    Reviewed: reviewed documented beta blocker date and time   Airway Mallampati: I       Dental  (+) Upper Dentures, Lower Dentures   Pulmonary COPD, former smoker,    Pulmonary exam normal        Cardiovascular + CAD  Normal cardiovascular exam Rhythm:Regular Rate:Normal     Neuro/Psych PSYCHIATRIC DISORDERS Anxiety Depression    GI/Hepatic   Endo/Other    Renal/GU      Musculoskeletal   Abdominal Normal abdominal exam  (+)   Peds  Hematology   Anesthesia Other Findings Notes recorded by Tamsen Snider on 09/04/2016 at 10:01 AM EDT Pt aware of echo results. ------  Notes recorded by Burtis Junes, NP on 09/03/2016 at 3:29 PM EDT Ok to report. Her echo is stable. Still with good pumping function - totally normal. Would advise getting back to see pulmonary.      Reproductive/Obstetrics                             Anesthesia Physical Anesthesia Plan  ASA: II  Anesthesia Plan: MAC   Post-op Pain Management:    Induction: Intravenous  PONV Risk Score and Plan: Propofol infusion  Airway Management Planned: Natural Airway, Nasal Cannula and Simple Face Mask  Additional Equipment:   Intra-op Plan:   Post-operative Plan:   Informed Consent: I have reviewed the patients History and Physical, chart, labs and discussed the procedure including the risks, benefits and alternatives for the proposed anesthesia with the patient or authorized representative who has indicated his/her understanding and acceptance.       Plan Discussed with: CRNA  Anesthesia Plan Comments:         Anesthesia Quick Evaluation

## 2018-05-14 NOTE — Transfer of Care (Signed)
Immediate Anesthesia Transfer of Care Note  Patient: Kim [name]  Procedure(s) Performed: COLONOSCOPY WITH PROPOFOL (N/A ) POLYPECTOMY  Patient Location: PACU  Anesthesia Type:MAC  Level of Consciousness: awake, alert  and oriented  Airway & Oxygen Therapy: Patient Spontanous Breathing and Patient connected to face mask oxygen  Post-op Assessment: Report given to RN and Post -op Vital signs reviewed and stable  Post vital signs: Reviewed and stable  Last Vitals:  Vitals Value Taken Time  BP    Temp    Pulse 72 05/14/2018  9:14 AM  Resp 20 05/14/2018  9:14 AM  SpO2 100 % 05/14/2018  9:14 AM  Vitals shown include unvalidated device data.  Last Pain:  Vitals:   05/14/18 0735  TempSrc: Oral  PainSc: 0-No pain         Complications: No apparent anesthesia complications

## 2018-05-14 NOTE — Op Note (Signed)
[facility] Patient Name: Kim [name] Procedure Date: 05/14/2018 MRN: [id number] Attending MD: Docia Chuck. Henrene Pastor , MD Date of Birth: 06-27-1955 CSN: [id number] Age: 63 Admit Type: Outpatient Procedure:                Colonoscopy with cold snare polypectomy x 1 Indications:              High risk colon cancer surveillance: Personal                            history of multiple (3 or more) adenomas. Previous                            examination December 2014. Also being assessed for                            postoperative left lower quadrant pain and                            constipation. NOTE: The patient had no change in                            her constant focal left lower quadrant pain after                            completing a bowel prep. This does suggest that                            constipation is not a factor Providers:                Docia Chuck. Henrene Pastor, MD, Cleda Daub, RN, Cherylynn Ridges, Technician, Rosario Adie, CRNA Referring MD:             Janie Morning, MD Medicines:                Monitored Anesthesia Care Complications:            No immediate complications. Estimated blood loss:                            None. Estimated Blood Loss:     Estimated blood loss: none. Procedure:                Pre-Anesthesia Assessment:                           - Prior to the procedure, a History and Physical                            was performed, and patient medications and                            allergies were reviewed. The patient's tolerance of  previous anesthesia was also reviewed. The risks                            and benefits of the procedure and the sedation                            options and risks were discussed with the patient.                            All questions were answered, and informed consent                            was obtained. Prior Anticoagulants: The patient  has                            taken no previous anticoagulant or antiplatelet                            agents. ASA Grade Assessment: III - A patient with                            severe systemic disease. After reviewing the risks                            and benefits, the patient was deemed in                            satisfactory condition to undergo the procedure.                           After obtaining informed consent, the colonoscope                            was passed under direct vision. Throughout the                            procedure, the patient's blood pressure, pulse, and                            oxygen saturations were monitored continuously. The                            PCF-H190DL ([id number]) Olympus pediatric colonscope                            was introduced through the anus and advanced to the                            the cecum, identified by appendiceal orifice and                            ileocecal valve. The ileocecal valve, appendiceal  orifice, and rectum were photographed. The quality                            of the bowel preparation was excellent. The                            colonoscopy was performed without difficulty. The                            patient tolerated the procedure well. The bowel                            preparation used was SUPREP. Scope In: 8:50:25 AM Scope Out: 9:08:15 AM Scope Withdrawal Time: 0 hours 12 minutes 17 seconds  Total Procedure Duration: 0 hours 17 minutes 50 seconds  Findings:      A 3 mm polyp was found in the ascending colon. The polyp was removed       with a cold snare. Resection and retrieval were complete.      The exam was otherwise without abnormality on direct and retroflexion       views. Impression:               - One 3 mm polyp in the ascending colon, removed                            with a cold snare. Resected and retrieved.                           - The  examination was otherwise normal on direct                            and retroflexion views.                           - No GI cause for pain found or suspected Moderate Sedation:      none Recommendation:           - Repeat colonoscopy in 5 years for surveillance if                            medically fit.                           - Patient has a contact number available for                            emergencies. The signs and symptoms of potential                            delayed complications were discussed with the                            patient. Return to normal activities tomorrow.  Written discharge instructions were provided to the                            patient.                           - Resume previous diet.                           - Continue present medications.                           - Await pathology results. Procedure Code(s):        --- Professional ---                           [phone], Colonoscopy, flexible; with removal of                            tumor(s), polyp(s), or other lesion(s) by snare                            technique Diagnosis Code(s):        --- Professional ---                           Z86.010, Personal history of colonic polyps                           D12.2, Benign neoplasm of ascending colon CPT copyright 2018 American Medical Association. All rights reserved. The codes documented in this report are preliminary and upon coder review may  be revised to meet current compliance requirements. Docia Chuck. Henrene Pastor, MD 05/14/2018 9:21:45 AM This report has been signed electronically. Number of Addenda: 0

## 2018-05-17 ENCOUNTER — Encounter (HOSPITAL_COMMUNITY): Payer: Self-pay | Admitting: Internal Medicine

## 2018-06-09 ENCOUNTER — Ambulatory Visit: Payer: 59 | Admitting: Adult Health

## 2018-06-09 ENCOUNTER — Encounter: Payer: Self-pay | Admitting: Adult Health

## 2018-06-09 VITALS — BP 138/66 | Temp 98.2°F | Wt 129.0 lb

## 2018-06-09 DIAGNOSIS — G8929 Other chronic pain: Secondary | ICD-10-CM | POA: Diagnosis not present

## 2018-06-09 NOTE — Progress Notes (Signed)
Subjective:    Patient ID: Kim [name], female    DOB: 26-Jan-1956, 63 y.o.   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  She presents to the office today for evaluation of chronic pain management   Indication for chronic opioid: Osteoarthritis involving multiple joints Medication and dose: Norvo 7.5-325 mg, every 6 hours PRN  # pills per month: 30 Last UDS date: 03/22/2018 Opioid Treatment Agreement signed (Y/N): Yes Opioid Treatment Agreement last reviewed with patient:  06/09/2018 [facility] reviewed this encounter (include red flags):  Yes, no red flags.   Feels as though her pain is adequately controlled  Review of Systems See HPI   Past Medical History:  Diagnosis Date  . C8 RADICULOPATHY 06/05/2009  . COPD 08/31/2007  . DEPRESSION 11/19/2006  . DYSLIPIDEMIA 03/12/2009  . MI (mitral incompetence)   . 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 OSTEOARTHRITIS 06/05/2009  . PARESTHESIA 08/24/2007  . TOBACCO ABUSE 01/25/2010    Social History   Socioeconomic History  . Marital status: Married    Spouse name: [name]  . Number of children: 2  . Years of education: Not on file  . Highest education level: Not on file  Occupational History  . Occupation: Patient care aide-sits with elderly lady  Social Needs  . Financial resource strain: Not hard at all  . Food insecurity:    Worry: Never true    Inability: Never true  . Transportation needs:    Medical: No    Non-medical: No  Tobacco Use  . Smoking status: Former Smoker    Packs/day: 2.00    Years: 38.00    Pack years: 76.00    Types: Cigarettes    Last attempt to quit: 03/07/2014    Years since quitting: 4.2  . Smokeless tobacco: Never Used  Substance and Sexual  Activity  . Alcohol use: No    Alcohol/week: 0.0 standard drinks  . Drug use: No  . Sexual activity: Not on file  Lifestyle  . Physical activity:    Days per week: 0 days    Minutes per session: 0 min  . Stress: Not at all  Relationships  . Social connections:    Talks on phone: More than three times a week    Gets together: More than three times a week    Attends religious service: More than 4 times per year    Active member of club or organization: No    Attends meetings of clubs or organizations: Never    Relationship status: Married  . Intimate partner violence:    Fear of current or ex partner: No    Emotionally abused: No    Physically abused: No    Forced sexual activity: No  Other Topics Concern  . Not on file  Social History Narrative   [facility] (01/12/17):   Originally from [facility]. Has lived in [place] for 17 years. Married. Has 2 dogs currently. No mold and only 1 indoor plant. Had her home professionally cleaned a month ago. No bird or hot tub exposure. 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     Past Surgical History:  Procedure Laterality Date  . ABDOMINAL HYSTERECTOMY    . APPENDECTOMY  1969  . CARDIAC CATHETERIZATION N/A 07/05/2015   Procedure: Left Heart  Cath and Coronary Angiography;  Surgeon: Leonie Man, MD;  Location: [facility];  Service: Cardiovascular;  Laterality: N/A;  . CHOLECYSTECTOMY  1989  . collapse lung  1990  . COLONOSCOPY WITH PROPOFOL N/A 05/14/2018   Procedure: COLONOSCOPY WITH PROPOFOL;  Surgeon: Irene Shipper, MD;  Location: [facility];  Service: Endoscopy;  Laterality: N/A;  . EXCISION MASS ABDOMINAL  2019  . EXPLORATORY LAPAROTOMY  1969  . POLYPECTOMY  05/14/2018   Procedure: POLYPECTOMY;  Surgeon: Irene Shipper, MD;  Location: [facility];  Service: Endoscopy;;  . SALPINGOOPHORECTOMY  2019    Family History  Problem Relation Age of Onset  . Cancer Mother        lung  . Heart attack  Mother   . Hypertension Sister   . Multiple sclerosis Brother   . Diabetes Sister   . Rheum arthritis Sister   . Thyroid disease Sister   . Multiple sclerosis Other   . Alcohol abuse Other   . Arthritis Other   . Diabetes Other   . Kidney disease Other   . Cancer Other        lung,ovarian,skin, uterine  . Stroke Other   . Heart disease Other   . Melanoma Other   . Heart attack Maternal Uncle   . Heart attack Other        NEICE  . Heart attack Other        NEPHEW  . Hypertension Brother   . Stroke Maternal Grandmother   . Colon cancer Neg Hx     Allergies  Allergen Reactions  . Lisinopril     cough  . Penicillins Hives    Has patient had a PCN reaction causing immediate rash, facial/tongue/throat swelling, SOB or lightheadedness with hypotension: Yes Has patient had a PCN reaction causing severe rash involving mucus membranes or skin necrosis: No Has patient had a PCN reaction that required hospitalization No Has patient had a PCN reaction occurring within the last 10 years: No If all of the above answers are "NO", then may proceed with Cephalosporin use.     Current Outpatient Medications on File Prior to Visit  Medication Sig Dispense Refill  . albuterol (PROVENTIL) (2.5 MG/3ML) 0.083% nebulizer solution Take 3 mLs (2.5 mg total) by nebulization every 6 (six) hours as needed for wheezing or shortness of breath. DX: J44.1 75 mL 3  . ALPRAZolam (XANAX) 0.5 MG tablet TAKE 1/2 TO 1 TABLET BY MOUTH 3 TIMES A DAY 60 tablet 2  . aspirin EC 81 MG EC tablet Take 1 tablet (81 mg total) by mouth daily.    . budesonide-formoterol (SYMBICORT) 160-4.5 MCG/ACT inhaler Inhale 2 puffs into the lungs 2 (two) times daily. 30.6 g 2  . Cholecalciferol (VITAMIN D3) 1000 units CAPS Take 1 capsule (1,000 Units total) by mouth 2 (two) times daily. 60 capsule 6  . furosemide (LASIX) 40 MG tablet Take 1 tablet (40 mg total) daily as needed by mouth (for weight gain of 3 lbs). 30 tablet 6  .  HYDROcodone-acetaminophen (NORCO) 7.5-325 MG tablet Take 1 tablet by mouth every 6 (six) hours as needed for moderate pain. 30 tablet 0  . HYDROcodone-acetaminophen (NORCO) 7.5-325 MG tablet Take 1 tablet by mouth every 6 (six) hours as needed for moderate pain. 30 tablet 0  . HYDROcodone-acetaminophen (NORCO) 7.5-325 MG tablet Take 1 tablet by mouth every 6 (six) hours as needed for moderate pain. 30 tablet 0  . nitroGLYCERIN (NITROSTAT) 0.4 MG  SL tablet Place 1 tablet (0.4 mg total) under the tongue every 5 (five) minutes as needed for chest pain. 25 tablet 3  . PARoxetine (PAXIL CR) 25 MG 24 hr tablet Take 1 tablet (25 mg total) by mouth every morning. 30 tablet 0  . rosuvastatin (CRESTOR) 20 MG tablet Take 1 tablet (20 mg total) by mouth daily. 90 tablet 3  . Spacer/Aero Chamber Mouthpiece MISC 1 Device by Does not apply route as directed. 1 each 0  . Tiotropium Bromide Monohydrate (SPIRIVA RESPIMAT) 2.5 MCG/ACT AERS Inhale 2 puffs into the lungs daily. 1 Inhaler 5  . VENTOLIN HFA 108 (90 Base) MCG/ACT inhaler INHALE 2 PUFFS BY MOUTH INTO THE LUNGS EVERY 4 HOURS AS NEEDED FOR WHEEZING OR SHORTNESS OF BREATH 18 g 0  . vitamin B-12 (CYANOCOBALAMIN) 500 MCG tablet Take 500 mcg by mouth 2 (two) times daily.    . metoprolol tartrate (LOPRESSOR) 25 MG tablet Take 0.5 tablets (12.5 mg total) by mouth 2 (two) times daily. 90 tablet 2   No current facility-administered medications on file prior to visit.     BP 138/66   Temp 98.2 F (36.8 C)   Wt 129 lb (58.5 kg)   BMI 22.85 kg/m       Objective:   Physical Exam Vitals signs and nursing note reviewed.  Cardiovascular:     Rate and Rhythm: Normal rate and regular rhythm.     Pulses: Normal pulses.     Heart sounds: Normal heart sounds.  Pulmonary:     Effort: Pulmonary effort is normal.     Breath sounds: Normal breath sounds.  Skin:    General: Skin is warm and dry.     Capillary Refill: Capillary refill takes less than 2 seconds.    Neurological:     General: No focal deficit present.     Mental Status: She is alert and oriented to person, place, and time.  Psychiatric:        Mood and Affect: Mood normal.        Behavior: Behavior normal.        Thought Content: Thought content normal.        Judgment: Judgment normal.        Assessment & Plan:

## 2018-06-11 LAB — PAIN MGMT, PROFILE 8 W/CONF, U
6 ACETYLMORPHINE: NEGATIVE ng/mL (ref <10)
ALPHAHYDROXYTRIAZOLAM: NEGATIVE ng/mL (ref <50)
Alcohol Metabolites: NEGATIVE ng/mL (ref <500)
Alphahydroxyalprazolam: 143 ng/mL — ABNORMAL HIGH (ref <25)
Alphahydroxymidazolam: NEGATIVE ng/mL (ref <50)
Aminoclonazepam: NEGATIVE ng/mL (ref <25)
Amphetamines: NEGATIVE ng/mL (ref <500)
Benzodiazepines: POSITIVE ng/mL — AB (ref <100)
Buprenorphine, Urine: NEGATIVE ng/mL (ref <5)
Cocaine Metabolite: NEGATIVE ng/mL (ref <150)
Creatinine: 172.3 mg/dL
Hydroxyethylflurazepam: NEGATIVE ng/mL (ref <50)
Lorazepam: NEGATIVE ng/mL (ref <50)
MDMA: NEGATIVE ng/mL (ref <500)
Marijuana Metabolite: NEGATIVE ng/mL (ref <20)
Nordiazepam: NEGATIVE ng/mL (ref <50)
Opiates: NEGATIVE ng/mL (ref <100)
Oxazepam: NEGATIVE ng/mL (ref <50)
Oxidant: NEGATIVE ug/mL (ref <200)
Oxycodone: NEGATIVE ng/mL (ref <100)
Temazepam: NEGATIVE ng/mL (ref <50)
pH: 5.84 (ref 4.5–9.0)

## 2018-06-15 ENCOUNTER — Other Ambulatory Visit: Payer: Self-pay | Admitting: Adult Health

## 2018-06-15 DIAGNOSIS — F419 Anxiety disorder, unspecified: Principal | ICD-10-CM

## 2018-06-15 DIAGNOSIS — F32A Depression, unspecified: Secondary | ICD-10-CM

## 2018-06-15 DIAGNOSIS — F329 Major depressive disorder, single episode, unspecified: Secondary | ICD-10-CM

## 2018-06-15 MED ORDER — ALPRAZOLAM 0.5 MG PO TABS: ORAL_TABLET | ORAL | 2 refills | Status: DC

## 2018-06-15 NOTE — Telephone Encounter (Signed)
Requested medication (s) are due for refill today: yes  Requested medication (s) are on the active medication list: yes  Last refill:  03/19/18  Future visit scheduled: no  Notes to clinic:  Not delegated    Requested Prescriptions  Pending Prescriptions Disp Refills   ALPRAZolam (XANAX) 0.5 MG tablet 60 tablet 2    Sig: TAKE 1/2 TO 1 TABLET BY MOUTH 3 TIMES A DAY     Not Delegated - Psychiatry:  Anxiolytics/Hypnotics Failed - 06/15/2018 10:02 AM      Failed - This refill cannot be delegated      Passed - Urine Drug Screen completed in last 360 days.      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

## 2018-06-15 NOTE — Telephone Encounter (Signed)
Copied from [place] [id number]. Topic: Quick Communication - Rx Refill/Question >> Jun 15, 2018  9:51 AM Virl Axe D wrote: Medication: ALPRAZolam Duanne Moron) 0.5 MG tablet / Pt stated she had an OV on 06/09/18 and this was not refilled. Please advise.  Has the patient contacted their pharmacy? No. (Agent: If no, request that the patient contact the pharmacy for the refill.) (Agent: If yes, when and what did the pharmacy advise?)  Preferred Pharmacy (with phone number or street name): [place], [place] [phone] (Phone) [phone] (Fax)    Agent: Please be advised that RX refills may take up to 3 business days. We ask that you follow-up with your pharmacy.

## 2018-06-22 ENCOUNTER — Telehealth: Payer: Self-pay

## 2018-06-22 DIAGNOSIS — M150 Primary generalized (osteo)arthritis: Principal | ICD-10-CM

## 2018-06-22 DIAGNOSIS — M159 Polyosteoarthritis, unspecified: Secondary | ICD-10-CM

## 2018-06-22 MED ORDER — HYDROCODONE-ACETAMINOPHEN 7.5-325 MG PO TABS: 1.0000 | ORAL_TABLET | Freq: Four times a day (QID) | ORAL | 0 refills | Status: DC | PRN

## 2018-06-22 NOTE — Telephone Encounter (Signed)
Copied from [place] [phone]. Topic: Quick Communication - Rx Refill/Question >> Jun 22, 2018 10:25 AM Jeri Cos wrote: Medication: HYDROcodone-acetaminophen (NORCO) 7.5-325 MG tablet  Has the patient contacted their pharmacy? Yes.    (Agent: If yes, when and what did the pharmacy advise?)Pharmacy had nothing on file  Preferred Pharmacy (with phone number or street name): [facility] [place], [place] - [address] [phone] (Phone) [phone] (Fax)    Agent: Please be advised that RX refills may take up to 3 business days. We ask that you follow-up with your pharmacy.

## 2018-07-02 ENCOUNTER — Telehealth: Payer: Self-pay | Admitting: Pulmonary Disease

## 2018-07-02 MED ORDER — ALBUTEROL SULFATE HFA 108 (90 BASE) MCG/ACT IN AERS: INHALATION_SPRAY | RESPIRATORY_TRACT | 0 refills | Status: DC

## 2018-07-02 MED ORDER — PREDNISONE 10 MG PO TABS: ORAL_TABLET | ORAL | 0 refills | Status: DC

## 2018-07-02 NOTE — Telephone Encounter (Signed)
Prednisone 10 mg tabs Take 4 tabs  daily with food x 4 days, then 3 tabs daily x 4 days, then 2 tabs daily x 4 days, then 1 tab daily x4 days then stop. #40  Tele visit if no better

## 2018-07-02 NOTE — Telephone Encounter (Signed)
Spoke with pt and advised of Dr Bari Mantis recommendations.  Rx for Prednisone sent to [facility].  Rx for Albuterol inhaler sent to [facility] per pt request. .  Pt verbalized understanding.  Nothing further needed.

## 2018-07-02 NOTE — Telephone Encounter (Signed)
Spoke with pt. States that she is not feeling well. Reports increased shortness of breath and wheezing. Denies chest tightness, coughing, fever, sick contacts or recent travel. She is currently taking her medications as prescribed. Has not tried any OTC medications for symptoms. Symptoms started a few days ago per the pt. She would like to have a large prednisone taper sent in. States this is the only thing that works well for her.  Dr. Elsworth Soho - please advise. Thanks.

## 2018-08-09 ENCOUNTER — Telehealth: Payer: Self-pay

## 2018-08-09 ENCOUNTER — Encounter: Payer: Self-pay | Admitting: Cardiology

## 2018-08-09 ENCOUNTER — Telehealth (INDEPENDENT_AMBULATORY_CARE_PROVIDER_SITE_OTHER): Payer: 59 | Admitting: Cardiology

## 2018-08-09 ENCOUNTER — Other Ambulatory Visit: Payer: Self-pay

## 2018-08-09 VITALS — BP 125/74 | HR 82 | Ht 63.0 in | Wt 130.0 lb

## 2018-08-09 DIAGNOSIS — I491 Atrial premature depolarization: Secondary | ICD-10-CM

## 2018-08-09 DIAGNOSIS — E785 Hyperlipidemia, unspecified: Secondary | ICD-10-CM

## 2018-08-09 DIAGNOSIS — I5032 Chronic diastolic (congestive) heart failure: Secondary | ICD-10-CM

## 2018-08-09 DIAGNOSIS — I493 Ventricular premature depolarization: Secondary | ICD-10-CM

## 2018-08-09 DIAGNOSIS — R609 Edema, unspecified: Secondary | ICD-10-CM

## 2018-08-09 MED ORDER — NITROGLYCERIN 0.4 MG SL SUBL: 0.4000 mg | SUBLINGUAL_TABLET | SUBLINGUAL | 3 refills | Status: DC | PRN

## 2018-08-09 MED ORDER — FUROSEMIDE 40 MG PO TABS: 40.0000 mg | ORAL_TABLET | Freq: Every day | ORAL | 5 refills | Status: DC | PRN

## 2018-08-09 NOTE — Patient Instructions (Signed)
Medication Instructions: Refilled Nitroglycerin and Lasix (PHARMACY)  If you need a refill on your cardiac medications before your next appointment, please call your pharmacy.   Lab work:DECEMBER (call office) [phone] to schedule Fasting Lipid Panel If you have labs (blood work) drawn today and your tests are completely normal, you will receive your results only by: Marland Kitchen MyChart Message (if you have MyChart) OR . A paper copy in the mail If you have any lab test that is abnormal or we need to change your treatment, we will call you to review the results.  Testing/Procedures: none  Follow-Up: 1 year with Dr Angelena Form At [facility], you and your health needs are our priority.  As part of our continuing mission to provide you with exceptional heart care, we have created designated Provider Care Teams.  These Care Teams include your primary Cardiologist (physician) and Advanced Practice Providers (APPs -  Physician Assistants and Nurse Practitioners) who all work together to provide you with the care you need, when you need it.  Any Other Special Instructions Will Be Listed Below (If Applicable). Please weigh daily:  If you gin more than 3 lbs in 1 DAY or 5 lbs in 1 WEEK, TAKE LASIX.  NOTIFY office if you are having persistent PVCs or PACs   DASH Eating Plan DASH stands for "Dietary Approaches to Stop Hypertension." The DASH eating plan is a healthy eating plan that has been shown to reduce high blood pressure (hypertension). It may also reduce your risk for type 2 diabetes, heart disease, and stroke. The DASH eating plan may also help with weight loss. What are tips for following this plan?  General guidelines  Avoid eating more than 2,300 mg (milligrams) of salt (sodium) a day. If you have hypertension, you may need to reduce your sodium intake to 1,500 mg a day.  Limit alcohol intake to no more than 1 drink a day for nonpregnant women and 2 drinks a day for men. One drink equals 12  oz of beer, 5 oz of wine, or 1 oz of hard liquor.  Work with your health care provider to maintain a healthy body weight or to lose weight. Ask what an ideal weight is for you.  Get at least 30 minutes of exercise that causes your heart to beat faster (aerobic exercise) most days of the week. Activities may include walking, swimming, or biking.  Work with your health care provider or diet and nutrition specialist (dietitian) to adjust your eating plan to your individual calorie needs. Reading food labels   Check food labels for the amount of sodium per serving. Choose foods with less than 5 percent of the Daily Value of sodium. Generally, foods with less than 300 mg of sodium per serving fit into this eating plan.  To find whole grains, look for the word "whole" as the first word in the ingredient list. Shopping  Buy products labeled as "low-sodium" or "no salt added."  Buy fresh foods. Avoid canned foods and premade or frozen meals. Cooking  Avoid adding salt when cooking. Use salt-free seasonings or herbs instead of table salt or sea salt. Check with your health care provider or pharmacist before using salt substitutes.  Do not fry foods. Cook foods using healthy methods such as baking, boiling, grilling, and broiling instead.  Cook with heart-healthy oils, such as olive, canola, soybean, or sunflower oil. Meal planning  Eat a balanced diet that includes: ? 5 or more servings of fruits and vegetables each day.  At each meal, try to fill half of your plate with fruits and vegetables. ? Up to 6-8 servings of whole grains each day. ? Less than 6 oz of lean meat, poultry, or fish each day. A 3-oz serving of meat is about the same size as a deck of cards. One egg equals 1 oz. ? 2 servings of low-fat dairy each day. ? A serving of nuts, seeds, or beans 5 times each week. ? Heart-healthy fats. Healthy fats called Omega-3 fatty acids are found in foods such as flaxseeds and coldwater fish,  like sardines, salmon, and mackerel.  Limit how much you eat of the following: ? Canned or prepackaged foods. ? Food that is high in trans fat, such as fried foods. ? Food that is high in saturated fat, such as fatty meat. ? Sweets, desserts, sugary drinks, and other foods with added sugar. ? Full-fat dairy products.  Do not salt foods before eating.  Try to eat at least 2 vegetarian meals each week.  Eat more home-cooked food and less restaurant, buffet, and fast food.  When eating at a restaurant, ask that your food be prepared with less salt or no salt, if possible. What foods are recommended? The items listed may not be a complete list. Talk with your dietitian about what dietary choices are best for you. Grains Whole-grain or whole-wheat bread. Whole-grain or whole-wheat pasta. Brown rice. Modena Morrow. Bulgur. Whole-grain and low-sodium cereals. Pita bread. Low-fat, low-sodium crackers. Whole-wheat flour tortillas. Vegetables Fresh or frozen vegetables (raw, steamed, roasted, or grilled). Low-sodium or reduced-sodium tomato and vegetable juice. Low-sodium or reduced-sodium tomato sauce and tomato paste. Low-sodium or reduced-sodium canned vegetables. Fruits All fresh, dried, or frozen fruit. Canned fruit in natural juice (without added sugar). Meat and other protein foods Skinless chicken or [place]. Ground chicken or [place]. Pork with fat trimmed off. Fish and seafood. Egg whites. Dried beans, peas, or lentils. Unsalted nuts, nut butters, and seeds. Unsalted canned beans. Lean cuts of beef with fat trimmed off. Low-sodium, lean deli meat. Dairy Low-fat (1%) or fat-free (skim) milk. Fat-free, low-fat, or reduced-fat cheeses. Nonfat, low-sodium ricotta or cottage cheese. Low-fat or nonfat yogurt. Low-fat, low-sodium cheese. Fats and oils Soft margarine without trans fats. Vegetable oil. Low-fat, reduced-fat, or light mayonnaise and salad dressings (reduced-sodium). Canola,  safflower, olive, soybean, and sunflower oils. Avocado. Seasoning and other foods Herbs. Spices. Seasoning mixes without salt. Unsalted popcorn and pretzels. Fat-free sweets. What foods are not recommended? The items listed may not be a complete list. Talk with your dietitian about what dietary choices are best for you. Grains Baked goods made with fat, such as croissants, muffins, or some breads. Dry pasta or rice meal packs. Vegetables Creamed or fried vegetables. Vegetables in a cheese sauce. Regular canned vegetables (not low-sodium or reduced-sodium). Regular canned tomato sauce and paste (not low-sodium or reduced-sodium). Regular tomato and vegetable juice (not low-sodium or reduced-sodium). Angie Fava. Olives. Fruits Canned fruit in a light or heavy syrup. Fried fruit. Fruit in cream or butter sauce. Meat and other protein foods Fatty cuts of meat. Ribs. Fried meat. Berniece Salines. Sausage. Bologna and other processed lunch meats. Salami. Fatback. Hotdogs. Bratwurst. Salted nuts and seeds. Canned beans with added salt. Canned or smoked fish. Whole eggs or egg yolks. Chicken or [place] with skin. Dairy Whole or 2% milk, cream, and half-and-half. Whole or full-fat cream cheese. Whole-fat or sweetened yogurt. Full-fat cheese. Nondairy creamers. Whipped toppings. Processed cheese and cheese spreads. Fats and oils Butter. Stick margarine.  Lard. Shortening. Ghee. Bacon fat. Tropical oils, such as coconut, palm kernel, or palm oil. Seasoning and other foods Salted popcorn and pretzels. Onion salt, garlic salt, seasoned salt, table salt, and sea salt. Worcestershire sauce. Tartar sauce. Barbecue sauce. Teriyaki sauce. Soy sauce, including reduced-sodium. Steak sauce. Canned and packaged gravies. Fish sauce. Oyster sauce. Cocktail sauce. Horseradish that you find on the shelf. Ketchup. Mustard. Meat flavorings and tenderizers. Bouillon cubes. Hot sauce and Tabasco sauce. Premade or packaged marinades. Premade or  packaged taco seasonings. Relishes. Regular salad dressings. Where to find more information:  National Heart, Lung, and [place]: https://wilson-eaton.com/  American Heart Association: www.heart.org Summary  The DASH eating plan is a healthy eating plan that has been shown to reduce high blood pressure (hypertension). It may also reduce your risk for type 2 diabetes, heart disease, and stroke.  With the DASH eating plan, you should limit salt (sodium) intake to 2,300 mg a day. If you have hypertension, you may need to reduce your sodium intake to 1,500 mg a day.  When on the DASH eating plan, aim to eat more fresh fruits and vegetables, whole grains, lean proteins, low-fat dairy, and heart-healthy fats.  Work with your health care provider or diet and nutrition specialist (dietitian) to adjust your eating plan to your individual calorie needs. This information is not intended to replace advice given to you by your health care provider. Make sure you discuss any questions you have with your health care provider. Document Released: 03/13/2011 Document Revised: 03/17/2016 Document Reviewed: 03/17/2016 Elsevier Interactive Patient Education  2019 [employer].

## 2018-08-09 NOTE — Progress Notes (Signed)
Virtual Visit via Video Note   This visit type was conducted due to national recommendations for restrictions regarding the COVID-19 Pandemic (e.g. social distancing) in an effort to limit this patient's exposure and mitigate transmission in our community.  Due to her co-morbid illnesses, this patient is at least at moderate risk for complications without adequate follow up.  This format is felt to be most appropriate for this patient at this time.  All issues noted in this document were discussed and addressed.  A limited physical exam was performed with this format.  Please refer to the patient's chart for her consent to telehealth for [facility]   Date:  08/09/2018   ID:  Kim [name], DOB 03/19/1956, MRN [id number]  Patient Location: Home Provider Location: Home  PCP:  [name], NP  Cardiologist:  Lauree Chandler, MD  Electrophysiologist:  None   Evaluation Performed:  Follow-Up Visit   Chief Complaint:  Annual F/u for H/o CHF  History of Present Illness:    Kim [name] is a 63 y.o. female with a history of NICM, COPD, HLD and tobacco abuse being evaluated for her annual cardiac assessment.   She is followed by Dr. Angelena Form. He first saw her as a new patient in April 2016. She was hospitalized in [place] December 2015 with COPD exacerbation. Troponin was mildly elevated at 2. Cardiac catheterization in 2015 showed no obstructive CAD. She was felt to have a COPD exacerbation and she improved with treatment of her COPD. She quit smoking in September 2015. Admitted to [facility] March 2017 with respiratory distress, CHF. Cardiac cath March 2017 with minimal CAD, severe LV systolic dysfunction (YYTK=[id number]%). 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  She was diuresed with clinical improvement. She has been seen several times in our office after discharge with dizziness and hypotension.  Lisinopril and Lasix were decreased. She is followed in the pulmonary office by Dr. Elsworth Soho. Event monitor April 2017 with sinus, PACs, PVCs. Echo May 2018 with normal LV systolic function, mild AI.   Today in f/u, she reports that she has done well from a cardiac standpoint.  She denies any chest pain.  No significant dyspnea.  Her COPD is stable.  No dyspnea on room air.  No supplemental O2 requirements.  She denies wheezing.  She does admit to some occasional weight gain, related to dietary indiscretion w/ sodium.  She weighs daily and takes Lasix as needed.  No orthopnea or PND.  She also notes occasional palpitations with pattern consistent with PACs and PVCs as previously documented by event monitor. These episodes are described as brief/ blips of flutters, lasting < 2-3 sec at a time. She denies any prolonged palpitations.  No associated dizziness.  Blood pressure is variable.  At times, she reports it can be low after taking lasix, however no syncope.  Blood pressure today is normotensive in the 568L systolic.  Heart rate in 80s.  She reports full medication compliance.   The patient does not have symptoms concerning for COVID-19 infection (fever, chills, cough, or new shortness of breath). She has been staying home. Her husband dose the grocery shopping.    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   OSTEOARTHRITIS 06/05/2009   PARESTHESIA 08/24/2007   TOBACCO ABUSE 01/25/2010  Past Surgical History:  Procedure Laterality Date   ABDOMINAL HYSTERECTOMY     APPENDECTOMY  1969   CARDIAC CATHETERIZATION N/A 07/05/2015   Procedure: Left Heart Cath and Coronary Angiography;  Surgeon: Leonie Man, MD;  Location: [facility];  Service: Cardiovascular;  Laterality: N/A;   CHOLECYSTECTOMY  1989   collapse lung  1990    COLONOSCOPY WITH PROPOFOL N/A 05/14/2018   Procedure: COLONOSCOPY WITH PROPOFOL;  Surgeon: Irene Shipper, MD;  Location: [facility];  Service: Endoscopy;  Laterality: N/A;   EXCISION MASS ABDOMINAL  2019   EXPLORATORY LAPAROTOMY  1969   POLYPECTOMY  05/14/2018   Procedure: POLYPECTOMY;  Surgeon: Irene Shipper, MD;  Location: [facility];  Service: Endoscopy;;   SALPINGOOPHORECTOMY  2019     Current Meds  Medication Sig   albuterol (PROVENTIL) (2.5 MG/3ML) 0.083% nebulizer solution Take 3 mLs (2.5 mg total) by nebulization every 6 (six) hours as needed for wheezing or shortness of breath. DX: J44.1   albuterol (VENTOLIN HFA) 108 (90 Base) MCG/ACT inhaler INHALE 2 PUFFS BY MOUTH INTO THE LUNGS EVERY 4 HOURS AS NEEDED FOR WHEEZING OR SHORTNESS OF BREATH   ALPRAZolam (XANAX) 0.5 MG tablet TAKE 1/2 TO 1 TABLET BY MOUTH 3 TIMES A DAY   aspirin EC 81 MG EC tablet Take 1 tablet (81 mg total) by mouth daily.   budesonide-formoterol (SYMBICORT) 160-4.5 MCG/ACT inhaler Inhale 2 puffs into the lungs 2 (two) times daily.   Cholecalciferol (VITAMIN D3) 1000 units CAPS Take 1 capsule (1,000 Units total) by mouth 2 (two) times daily.   furosemide (LASIX) 40 MG tablet Take 1 tablet (40 mg total) by mouth daily as needed (for weight gain of 3 lbs).   HYDROcodone-acetaminophen (NORCO) 7.5-325 MG tablet Take 1 tablet by mouth every 6 (six) hours as needed for moderate pain.   HYDROcodone-acetaminophen (NORCO) 7.5-325 MG tablet Take 1 tablet by mouth every 6 (six) hours as needed for moderate pain.   nitroGLYCERIN (NITROSTAT) 0.4 MG SL tablet Place 1 tablet (0.4 mg total) under the tongue every 5 (five) minutes as needed for chest pain.   PARoxetine (PAXIL CR) 25 MG 24 hr tablet Take 1 tablet (25 mg total) by mouth every morning.   rosuvastatin (CRESTOR) 20 MG tablet Take 1 tablet (20 mg total) by mouth daily.   Spacer/Aero Chamber Mouthpiece MISC 1 Device by Does not apply route as directed.    Tiotropium Bromide Monohydrate (SPIRIVA RESPIMAT) 2.5 MCG/ACT AERS Inhale 2 puffs into the lungs daily.   vitamin B-12 (CYANOCOBALAMIN) 500 MCG tablet Take 500 mcg by mouth 2 (two) times daily.   [DISCONTINUED] furosemide (LASIX) 40 MG tablet Take 1 tablet (40 mg total) daily as needed by mouth (for weight gain of 3 lbs).   [DISCONTINUED] nitroGLYCERIN (NITROSTAT) 0.4 MG SL tablet Place 1 tablet (0.4 mg total) under the tongue every 5 (five) minutes as needed for chest pain.     Allergies:   Lisinopril and Penicillins   Social History   Tobacco Use   Smoking status: Former Smoker    Packs/day: 2.00    Years: 38.00    Pack years: 76.00    Types: Cigarettes    Last attempt to quit: 03/07/2014    Years since quitting: 4.4   Smokeless tobacco: Never Used  Substance Use Topics   Alcohol use: No    Alcohol/week: 0.0 standard drinks   Drug use: No     Family Hx: The patient's family history  includes Alcohol abuse in an other family member; Arthritis in an other family member; Cancer in her mother and another family member; Diabetes in her sister and another family member; Heart attack in her maternal uncle, mother, and other family members; Heart disease in an other family member; Hypertension in her brother and sister; Kidney disease in an other family member; Melanoma in an other family member; Multiple sclerosis in her brother and another family member; Rheum arthritis in her sister; Stroke in her maternal grandmother and another family member; Thyroid disease in her sister. There is no history of Colon cancer.  ROS:   Please see the history of present illness.     All other systems reviewed and are negative.   Prior CV studies:   The following studies were reviewed today:  [facility] 2017 Procedures   Left Heart Cath and Coronary Angiography  Conclusion   1. Angiographically minimal coronary disease 2. There is severe left ventricular systolic dysfunction. 3. 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  Recommendations:  Return the CCU/TCU for post radial cath care and ongoing treatment of nonischemic cardiomyopathy  Continue aggressive risk modification and diuresis.  Treat underlying cause for stress induced cardiomyopathy    2D Echo 06/2015 Impressions:  - There is akinesis of all of the apical segments, hypokinesis of   the mid segments and normal motion of the basal segments   consistent with stress induced cardiomyopathy (Tako-tsubo).   LVEF is severely decreased estimated at 25-30%.  2D Echo 08/2016 Study Conclusions  - Left ventricle: The cavity size was normal. Systolic function was   normal. The estimated ejection fraction was in the range of 60%   to 65%. Wall motion was normal; there were no regional wall   motion abnormalities. - Aortic valve: There was mild regurgitation. - Atrial septum: No defect or patent foramen ovale was identified.  Labs/Other Tests and Data Reviewed:    EKG:  No ECG reviewed.  Recent Labs: 03/22/2018: ALT 12; BUN 18; Creatinine, Ser 0.79; Hemoglobin 13.7; Platelets 292.0; Potassium 4.3; Sodium 142; TSH 3.11   Recent Lipid Panel Lab Results  Component Value Date/Time   CHOL 132 03/22/2018 07:57 AM   CHOL 221 (H) 01/25/2018 08:11 AM   TRIG 71.0 03/22/2018 07:57 AM   HDL 48.70 03/22/2018 07:57 AM   HDL 41 01/25/2018 08:11 AM   CHOLHDL 3 03/22/2018 07:57 AM   LDLCALC 69 03/22/2018 07:57 AM   LDLCALC 151 (H) 01/25/2018 08:11 AM   LDLDIRECT 155.4 12/29/2012 12:09 PM    Wt Readings from Last 3 Encounters:  08/09/18 130 lb (59 kg)  06/09/18 129 lb (58.5 kg)  05/14/18 132 lb (59.9 kg)     Objective:    Vital Signs:  BP 125/74    Pulse 82    Ht 5\' 3"  (1.6 m)    Wt 130 lb (59 kg)    SpO2 92%    BMI 23.03 kg/m    VITAL SIGNS:  reviewed GEN:  no acute distress EYES:  sclerae anicteric, EOMI - Extraocular  Movements Intact RESPIRATORY:  normal respiratory effort, symmetric expansion CARDIOVASCULAR:  no peripheral edema SKIN:  no rash, lesions or ulcers. MUSCULOSKELETAL:  no obvious deformities. NEURO:  alert and oriented x 3, no obvious focal deficit PSYCH:  normal affect  ASSESSMENT & PLAN:    1. 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  EF at that time  was 20 to 25% however normalized by repeat echocardiogram.  She does take as needed Lasix for fluid in the setting of diastolic dysfunction.  She denies any dyspnea, orthopnea, PND or lower extremity edema.  Continue daily weights and Lasix as needed.  We discussed low-sodium diet.   2. CAD: Minimal CAD noted on cath in 2017.  She denies any chest pain.  She is on statin with controlled LDL.  LDL was 69 mg/dL in December 2019. Continue Crestor. Repeat lipids again in Dec.   3. AI: Mild AI noted on echocardiogram in 2018.  She denies any dyspnea.  No chest pain.  4. H/o PAC and PVCs: Documented on cardiac monitor in 2017.  She does note occasional palpitations that feel like a quick blip of flutters that typically last 2 to 3 seconds.  She denies any prolonged palpitations and no other symptoms.  Continue on metoprolol daily.  Heart rate controlled today in the 80s.  5. COPD: Followed by Dr. Elsworth Soho.  Stable.  No current supplemental O2 requirements. Denies wheezing.   6. H/o Symptomatic Hypotension: Blood pressure fairly stable.  Normotensive today in the 665L systolic.  She does note at times her blood pressure is borderline low if she takes Lasix.  Denies syncope/near syncope.  She monitors her blood pressure at home.  7. HLD: controlled w/ Crestor. Repeat FLP and HFTs in 6 months.   COVID-19 Education: The signs and symptoms of COVID-19 were discussed with the patient and how to seek care for testing (follow up with PCP or arrange E-visit).  The importance of social distancing was discussed today.  Time:   Today, I  have spent 22 minutes with the patient with telehealth technology discussing the above problems.     Medication Adjustments/Labs and Tests Ordered: Current medicines are reviewed at length with the patient today.  Concerns regarding medicines are outlined above.   Tests Ordered: Orders Placed This Encounter  Procedures   Lipid Profile    Medication Changes: Meds ordered this encounter  Medications   furosemide (LASIX) 40 MG tablet    Sig: Take 1 tablet (40 mg total) by mouth daily as needed (for weight gain of 3 lbs).    Dispense:  30 tablet    Refill:  5   nitroGLYCERIN (NITROSTAT) 0.4 MG SL tablet    Sig: Place 1 tablet (0.4 mg total) under the tongue every 5 (five) minutes as needed for chest pain.    Dispense:  25 tablet    Refill:  3    Disposition:  Follow up in 1 yr w/ Dr. Angelena Form  Signed, Lyda Jester, PA-C  08/09/2018 9:11 AM    [place]

## 2018-08-09 NOTE — Telephone Encounter (Signed)
I spoke to the patient and reminded her to review My Chart for f/u information and to call if questions.

## 2018-09-10 ENCOUNTER — Telehealth: Payer: Self-pay | Admitting: Pulmonary Disease

## 2018-09-10 MED ORDER — PREDNISONE 10 MG PO TABS: ORAL_TABLET | ORAL | 0 refills | Status: DC

## 2018-09-10 MED ORDER — AZITHROMYCIN 250 MG PO TABS: ORAL_TABLET | ORAL | 0 refills | Status: DC

## 2018-09-10 NOTE — Telephone Encounter (Signed)
Spoke to pt and relayed recommendations.  Script sent to preferred pharmacy.  Mychart video visit made for 09/24/18 with TN.  Nothing further is needed.

## 2018-09-10 NOTE — Telephone Encounter (Signed)
Spoke with pt. She is COPD gold III of DR Elsworth Soho. She stated she is having a COPD flare.  Pt has increased SOB, cough with white/beige mucus x4 days.  Is using duonebs in am and pm and has even double up on treatments for 1 day.  Is using albuterol HFA up to 5 times a day.  She is using symbicort twice a day and is not using spiriva when taking her duonebs. Treatments have been helping but does not take her back to baseline.  Denies fever.  O2 sats drop to 88% with exertion and uses supplimental oxygen when needed.  Pt stated she had prednisone in March which really helped. Aaron Edelman, please advise.

## 2018-09-10 NOTE — Telephone Encounter (Signed)
Can offer:  Prednisone 10mg  tablet  >>>4 tabs for 2 days, then 3 tabs for 2 days, 2 tabs for 2 days, then 1 tab for 2 days, then stop >>>take with food  >>>take in the morning   Azithromycin 250mg  tablet  >>>Take 2 tablets (500mg  total) today, and then 1 tablet (250mg ) for the next four days  >>>take with food  >>>can also take probiotic and / or yogurt while on antibiotic   Please place the order.  I see the patient has an active MyChart account.  Please ensure the patient is able to use a video visit.  That she has the capabilities such as having a smart phone or has a WebCam with her computer.  I would like the patient to have a 2-week follow-up with [place] to ensure that symptoms are improving.  I would prefer for this to be a video visit.  If patient is unable to complete a video visit then can be office visit if asymptomatic and COVID screen is negative.  [name], FNP

## 2018-09-16 ENCOUNTER — Other Ambulatory Visit: Payer: Self-pay | Admitting: Family Medicine

## 2018-09-16 DIAGNOSIS — F32A Depression, unspecified: Secondary | ICD-10-CM

## 2018-09-16 DIAGNOSIS — F329 Major depressive disorder, single episode, unspecified: Secondary | ICD-10-CM

## 2018-09-16 MED ORDER — ALPRAZOLAM 0.5 MG PO TABS: ORAL_TABLET | ORAL | 2 refills | Status: DC

## 2018-09-24 ENCOUNTER — Ambulatory Visit (INDEPENDENT_AMBULATORY_CARE_PROVIDER_SITE_OTHER): Payer: 59 | Admitting: Nurse Practitioner

## 2018-09-24 ENCOUNTER — Encounter: Payer: Self-pay | Admitting: Nurse Practitioner

## 2018-09-24 ENCOUNTER — Other Ambulatory Visit: Payer: Self-pay

## 2018-09-24 DIAGNOSIS — J449 Chronic obstructive pulmonary disease, unspecified: Secondary | ICD-10-CM

## 2018-09-24 NOTE — Assessment & Plan Note (Addendum)
Recently had a COPD exacerbation and treated with prednisone and azithromycin on 09/10/2018 Kim [name] (phone note).  States that this did help she is feeling much better now.  Denies any significant shortness of breath or wheezing at this time.  She denies any significant cough.  She denies any fever.  She did take her vital signs this morning and they are stable.  Her O2 sats are at 96% on room air.  Does use 2 L of oxygen with exertion.  Patient is compliant with Symbicort, Spiriva, and albuterol as needed.   Patient Instructions  Continue Symbicort Continue albuterol as needed Continue Spiriva   Follow-up annual screening CT chest in July 2020

## 2018-09-24 NOTE — Progress Notes (Signed)
Virtual Visit via Telephone Note  I connected with Kim [name] on 09/24/18 at 10:00 AM EDT by telephone and verified that I am speaking with the correct person using two identifiers.  Location: Patient: home Provider: office   I discussed the limitations, risks, security and privacy concerns of performing an evaluation and management service by telephone and the availability of in person appointments. I also discussed with the patient that there may be a patient responsible charge related to this service. The patient expressed understanding and agreed to proceed.   History of Present Illness: 63 year old female with severe COPD who is followed by Dr. Elsworth Soho.  She has a tele-visit today for a follow-up.  Recently had a COPD exacerbation and treated with prednisone and azithromycin on 09/10/2018 [name] (phone note).  States that this did help she is feeling much better now.  Denies any significant shortness of breath or wheezing at this time.  She denies any significant cough.  She denies any fever.  She did take her vital signs this morning and they are stable.  Her O2 sats are at 96% on room air.  Does use 2 L of oxygen with exertion.  Patient is compliant with Symbicort, Spiriva, and albuterol as needed. Denies f/c/s, n/v/d, hemoptysis, PND, leg swelling.     Observations/Objective: CT chest 10/29/17 - Lung-RADS 2, benign appearance or behavior. Continue annual screening with low-dose chest CT without contrast in 12 months. Three-vessel coronary atherosclerosis. Stable right adrenal nodule, presumably an adenoma.  PFT Results Latest Ref Rng & Units 11/16/2014  FVC-Pre L 2.00  FVC-Predicted Pre % 63  FVC-Post L 2.21  FVC-Predicted Post % 70  Pre FEV1/FVC % % 50  Post FEV1/FCV % % 48  FEV1-Pre L 1.00  FEV1-Predicted Pre % 41  FEV1-Post L 1.05  DLCO UNC% % 51  DLCO COR %Predicted % 57  TLC L 5.06  TLC % Predicted % 103  RV % Predicted % 140     Assessment and Plan: Recently had  a COPD exacerbation and treated with prednisone and azithromycin on 09/10/2018 [name] (phone note).  States that this did help she is feeling much better now.  Denies any significant shortness of breath or wheezing at this time.  She denies any significant cough.  She denies any fever.  She did take her vital signs this morning and they are stable.  Her O2 sats are at 96% on room air.  Does use 2 L of oxygen with exertion.  Patient is compliant with Symbicort, Spiriva, and albuterol as needed.   Patient Instructions  Continue Symbicort Continue albuterol as needed Continue Spiriva   Follow-up annual screening CT chest in July 2020    Follow Up Instructions: Follow up with Dr. Elsworth Soho in 3 months or sooner if needed   I discussed the assessment and treatment plan with the patient. The patient was provided an opportunity to ask questions and all were answered. The patient agreed with the plan and demonstrated an understanding of the instructions.   The patient was advised to call back or seek an in-person evaluation if the symptoms worsen or if the condition fails to improve as anticipated.  I provided 22 minutes of non-face-to-face time during this encounter.   Fenton Foy, NP

## 2018-09-24 NOTE — Patient Instructions (Addendum)
Continue Symbicort Continue albuterol as needed Continue Spiriva   Follow-up annual screening CT chest in July 2020  Follow up with Dr. Elsworth Soho in 3 months or sooner if needed

## 2018-11-12 ENCOUNTER — Other Ambulatory Visit: Payer: Self-pay | Admitting: Adult Health

## 2018-11-12 ENCOUNTER — Other Ambulatory Visit: Payer: Self-pay

## 2018-11-12 ENCOUNTER — Telehealth (INDEPENDENT_AMBULATORY_CARE_PROVIDER_SITE_OTHER): Payer: 59 | Admitting: Adult Health

## 2018-11-12 DIAGNOSIS — F329 Major depressive disorder, single episode, unspecified: Secondary | ICD-10-CM

## 2018-11-12 DIAGNOSIS — G8929 Other chronic pain: Secondary | ICD-10-CM | POA: Diagnosis not present

## 2018-11-12 DIAGNOSIS — F419 Anxiety disorder, unspecified: Secondary | ICD-10-CM | POA: Diagnosis not present

## 2018-11-12 MED ORDER — ALPRAZOLAM 0.5 MG PO TABS: ORAL_TABLET | ORAL | 0 refills | Status: DC

## 2018-11-12 MED ORDER — PREDNISONE 10 MG PO TABS: ORAL_TABLET | ORAL | 0 refills | Status: DC

## 2018-11-12 NOTE — Progress Notes (Signed)
Virtual Visit via Telephone Note  I connected with Kim [name] on 11/12/18 at  3:30 PM EDT by telephone and verified that I am speaking with the correct person using two identifiers.   I discussed the limitations, risks, security and privacy concerns of performing an evaluation and management service by telephone and the availability of in person appointments. I also discussed with the patient that there may be a patient responsible charge related to this service. The patient expressed understanding and agreed to proceed.  Location patient: home Location provider: work or home office Participants present for the call: patient, provider Patient did not have a visit in the prior 7 days to address this/these issue(s).   History of Present Illness: Indication for chronic opioid: Osteoarthritis involving multiple joints Medication and dose: Norco 7.5-325mg , every 6 hours PRN  # pills per month: 30 Last UDS date: 06/09/2018 Opioid Treatment Agreement signed (Y/N): YES Opioid Treatment Agreement last reviewed with patient:  11/12/2018 [facility] reviewed this encounter (include red flags):  none  She reports feeling well controlled with this dose.   She also needs a refill of xanax sent to the pharmacy     Observations/Objective: Patient sounds cheerful and well on the phone. I do not appreciate any SOB. Speech and thought processing are grossly intact. Patient reported vitals:  Assessment and Plan: 1. Encounter for chronic pain management  - Pain Mgmt, Profile 8 w/Conf, U; Future  2. Anxiety and depression  - ALPRAZolam (XANAX) 0.5 MG tablet; TAKE 1/2 TO 1 TABLET BY MOUTH 3 TIMES A DAY  Dispense: 60 tablet; Refill: 0   Follow Up Instructions:  I did not refer this patient for an OV in the next 24 hours for this/these issue(s).  I discussed the assessment and treatment plan with the patient. The patient was provided an opportunity to ask questions and all were answered. The patient  agreed with the plan and demonstrated an understanding of the instructions.   The patient was advised to call back or seek an in-person evaluation if the symptoms worsen or if the condition fails to improve as anticipated.  I provided 4minutes of non-face-to-face time during this encounter.   [name], NP

## 2018-11-15 ENCOUNTER — Telehealth: Payer: Self-pay | Admitting: Adult Health

## 2018-11-15 NOTE — Telephone Encounter (Signed)
Patient needs appt for controlled medication urine check.

## 2018-11-16 NOTE — Telephone Encounter (Signed)
Pt now scheduled.  Nothing further needed. 

## 2018-11-17 ENCOUNTER — Other Ambulatory Visit: Payer: 59

## 2018-11-17 ENCOUNTER — Other Ambulatory Visit: Payer: Self-pay

## 2018-11-17 DIAGNOSIS — G8929 Other chronic pain: Secondary | ICD-10-CM

## 2018-11-19 LAB — PAIN MGMT, PROFILE 8 W/CONF, U
6 Acetylmorphine: NEGATIVE ng/mL
Alcohol Metabolites: NEGATIVE ng/mL (ref <500)
Alphahydroxyalprazolam: 177 ng/mL
Alphahydroxymidazolam: NEGATIVE ng/mL
Alphahydroxytriazolam: NEGATIVE ng/mL
Aminoclonazepam: NEGATIVE ng/mL
Amphetamines: NEGATIVE ng/mL
Benzodiazepines: POSITIVE ng/mL
Buprenorphine, Urine: NEGATIVE ng/mL
Cocaine Metabolite: NEGATIVE ng/mL
Codeine: NEGATIVE ng/mL
Creatinine: 114.1 mg/dL
Hydrocodone: 1982 ng/mL
Hydromorphone: 211 ng/mL
Hydroxyethylflurazepam: NEGATIVE ng/mL
Lorazepam: NEGATIVE ng/mL
MDMA: NEGATIVE ng/mL
Marijuana Metabolite: NEGATIVE ng/mL
Morphine: NEGATIVE ng/mL
Nordiazepam: NEGATIVE ng/mL
Norhydrocodone: 2886 ng/mL
Opiates: POSITIVE ng/mL
Oxazepam: NEGATIVE ng/mL
Oxidant: NEGATIVE ug/mL
Oxycodone: NEGATIVE ng/mL
Temazepam: NEGATIVE ng/mL
pH: 6.3 (ref 4.5–9.0)

## 2018-11-23 ENCOUNTER — Other Ambulatory Visit: Payer: Self-pay | Admitting: Adult Health

## 2018-11-23 DIAGNOSIS — M159 Polyosteoarthritis, unspecified: Secondary | ICD-10-CM

## 2018-11-23 MED ORDER — HYDROCODONE-ACETAMINOPHEN 7.5-325 MG PO TABS: 1.0000 | ORAL_TABLET | Freq: Four times a day (QID) | ORAL | 0 refills | Status: DC | PRN

## 2018-12-14 ENCOUNTER — Other Ambulatory Visit: Payer: Self-pay | Admitting: Pulmonary Disease

## 2018-12-15 ENCOUNTER — Other Ambulatory Visit: Payer: Self-pay

## 2018-12-15 ENCOUNTER — Ambulatory Visit (INDEPENDENT_AMBULATORY_CARE_PROVIDER_SITE_OTHER)
Admission: RE | Admit: 2018-12-15 | Discharge: 2018-12-15 | Disposition: A | Discharge status: Home / Self Care | Payer: 59 | Source: Ambulatory Visit | Attending: Acute Care | Admitting: Acute Care

## 2018-12-15 DIAGNOSIS — Z122 Encounter for screening for malignant neoplasm of respiratory organs: Secondary | ICD-10-CM

## 2018-12-15 DIAGNOSIS — Z87891 Personal history of nicotine dependence: Secondary | ICD-10-CM

## 2018-12-16 ENCOUNTER — Other Ambulatory Visit: Payer: Self-pay | Admitting: Family Medicine

## 2018-12-16 MED ORDER — METOPROLOL TARTRATE 25 MG PO TABS: 12.5000 mg | ORAL_TABLET | Freq: Two times a day (BID) | ORAL | 0 refills | Status: DC

## 2018-12-16 MED ORDER — SPIRIVA RESPIMAT 2.5 MCG/ACT IN AERS: 2.0000 | INHALATION_SPRAY | Freq: Every day | RESPIRATORY_TRACT | 0 refills | Status: DC

## 2018-12-16 MED ORDER — PAROXETINE HCL ER 25 MG PO TB24: 25.0000 mg | ORAL_TABLET | Freq: Every morning | ORAL | 0 refills | Status: DC

## 2018-12-16 NOTE — Telephone Encounter (Signed)
Sent to the pharmacy by e-scribe. 

## 2018-12-17 ENCOUNTER — Ambulatory Visit (INDEPENDENT_AMBULATORY_CARE_PROVIDER_SITE_OTHER): Payer: 59 | Admitting: Adult Health

## 2018-12-17 ENCOUNTER — Other Ambulatory Visit: Payer: Self-pay

## 2018-12-17 ENCOUNTER — Other Ambulatory Visit: Payer: Self-pay | Admitting: *Deleted

## 2018-12-17 ENCOUNTER — Encounter: Payer: Self-pay | Admitting: Adult Health

## 2018-12-17 VITALS — BP 160/80 | Temp 98.1°F | Ht 62.5 in | Wt 139.0 lb

## 2018-12-17 DIAGNOSIS — F419 Anxiety disorder, unspecified: Secondary | ICD-10-CM

## 2018-12-17 DIAGNOSIS — J449 Chronic obstructive pulmonary disease, unspecified: Secondary | ICD-10-CM | POA: Diagnosis not present

## 2018-12-17 DIAGNOSIS — Z0000 Encounter for general adult medical examination without abnormal findings: Secondary | ICD-10-CM

## 2018-12-17 DIAGNOSIS — E785 Hyperlipidemia, unspecified: Secondary | ICD-10-CM

## 2018-12-17 DIAGNOSIS — F32A Depression, unspecified: Secondary | ICD-10-CM

## 2018-12-17 DIAGNOSIS — F329 Major depressive disorder, single episode, unspecified: Secondary | ICD-10-CM

## 2018-12-17 DIAGNOSIS — Z122 Encounter for screening for malignant neoplasm of respiratory organs: Secondary | ICD-10-CM

## 2018-12-17 DIAGNOSIS — Z23 Encounter for immunization: Secondary | ICD-10-CM

## 2018-12-17 DIAGNOSIS — M159 Polyosteoarthritis, unspecified: Secondary | ICD-10-CM

## 2018-12-17 DIAGNOSIS — Z87891 Personal history of nicotine dependence: Secondary | ICD-10-CM

## 2018-12-17 DIAGNOSIS — M150 Primary generalized (osteo)arthritis: Secondary | ICD-10-CM

## 2018-12-17 LAB — COMPREHENSIVE METABOLIC PANEL
ALT: 10 U/L (ref 0–35)
AST: 13 U/L (ref 0–37)
Albumin: 4.2 g/dL (ref 3.5–5.2)
Alkaline Phosphatase: 64 U/L (ref 39–117)
BUN: 18 mg/dL (ref 6–23)
CO2: 30 mEq/L (ref 19–32)
Calcium: 9.9 mg/dL (ref 8.4–10.5)
Chloride: 109 mEq/L (ref 96–112)
Creatinine, Ser: 0.89 mg/dL (ref 0.40–1.20)
GFR: 63.68 mL/min (ref >60.00)
Glucose, Bld: 101 mg/dL — ABNORMAL HIGH (ref 70–99)
Potassium: 4.4 mEq/L (ref 3.5–5.1)
Sodium: 146 mEq/L — ABNORMAL HIGH (ref 135–145)
Total Bilirubin: 0.3 mg/dL (ref 0.2–1.2)
Total Protein: 6.1 g/dL (ref 6.0–8.3)

## 2018-12-17 LAB — CBC WITH DIFFERENTIAL/PLATELET
Basophils Absolute: 0.1 10*3/uL (ref 0.0–0.1)
Basophils Relative: 0.8 % (ref 0.0–3.0)
Eosinophils Absolute: 0.2 10*3/uL (ref 0.0–0.7)
Eosinophils Relative: 2 % (ref 0.0–5.0)
HCT: 40.7 % (ref 36.0–46.0)
Hemoglobin: 13.7 g/dL (ref 12.0–15.0)
Lymphocytes Relative: 42.2 % (ref 12.0–46.0)
Lymphs Abs: 4 10*3/uL (ref 0.7–4.0)
MCHC: 33.6 g/dL (ref 30.0–36.0)
MCV: 93.5 fl (ref 78.0–100.0)
Monocytes Absolute: 0.9 10*3/uL (ref 0.1–1.0)
Monocytes Relative: 9.7 % (ref 3.0–12.0)
Neutro Abs: 4.4 10*3/uL (ref 1.4–7.7)
Neutrophils Relative %: 45.3 % (ref 43.0–77.0)
Platelets: 271 10*3/uL (ref 150.0–400.0)
RBC: 4.35 Mil/uL (ref 3.87–5.11)
RDW: 14.1 % (ref 11.5–15.5)
WBC: 9.6 10*3/uL (ref 4.0–10.5)

## 2018-12-17 LAB — IBC + FERRITIN
Ferritin: 32.2 ng/mL (ref 10.0–291.0)
Iron: 53 ug/dL (ref 42–145)
Saturation Ratios: 14.4 % — ABNORMAL LOW (ref 20.0–50.0)
Transferrin: 262 mg/dL (ref 212.0–360.0)

## 2018-12-17 LAB — TSH: TSH: 1.76 u[IU]/mL (ref 0.35–4.50)

## 2018-12-17 LAB — LIPID PANEL
Cholesterol: 131 mg/dL (ref 0–200)
HDL: 44.4 mg/dL (ref >39.00)
LDL Cholesterol: 66 mg/dL (ref 0–99)
NonHDL: 86.83
Total CHOL/HDL Ratio: 3
Triglycerides: 104 mg/dL (ref 0.0–149.0)
VLDL: 20.8 mg/dL (ref 0.0–40.0)

## 2018-12-17 MED ORDER — PREDNISONE 10 MG PO TABS: ORAL_TABLET | ORAL | 1 refills | Status: DC

## 2018-12-17 NOTE — Addendum Note (Signed)
Addended by: Miles Costain T on: 12/17/2018 03:02 PM   Modules accepted: Orders

## 2018-12-17 NOTE — Progress Notes (Signed)
Subjective:    Patient ID: Kim [name], female    DOB: 11-10-1955, 63 y.o.   MRN: [id number]  HPI Patient presents for yearly preventative medicine examination. 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  COPD -she is followed by pulmonary.  Doing well with Symbicort and Spiriva.  She uses home oxygen very rarely. Does reports wheezing and some mild shortness of breath over the last few days.   Hyperlipidemia/CAD -she had a cath in 2017 that showed mild CAD.  Followed by cardiology on a routine basis.  Currently prescribed Crestor 20 mg daily and 81 mg aspirin.  She denies chest pain. Will have an occasional palpitation.  These episodes are described as brief/blips of flutters, lasting less than 2 to 3 seconds at a time.  Consistent with PACs and PVCs as previously documented by event monitor. Lab Results  Component Value Date   CHOL 132 03/22/2018   HDL 48.70 03/22/2018   LDLCALC 69 03/22/2018   LDLDIRECT 155.4 12/29/2012   TRIG 71.0 03/22/2018   CHOLHDL 3 03/22/2018   Osteoarthritis -prescribed Norco 7.5-3.25 mg.  She does follow-up every 3 months for pain management visit.  Her last visit was in August 2020  Anxiety/Depression -she is well controlled with Paxil 25 mg and Xanax 0.5 mg 3 times daily  All immunizations and health maintenance protocols were reviewed with the patient and needed orders were placed.  Appropriate screening laboratory values were ordered for the patient including screening of hyperlipidemia, renal function and hepatic function.   Medication reconciliation,  past medical history, social history, problem list and allergies were reviewed in detail with the patient   Wt Readings from Last 3  Encounters:  12/17/18 139 lb (63 kg)  08/09/18 130 lb (59 kg)  06/09/18 129 lb (58.5 kg)   Goals were established with regard to weight loss, exercise, and  diet in compliance with medications    Review of Systems  Constitutional: Negative.   HENT: Negative.   Eyes: Negative.   Respiratory: Positive for shortness of breath and wheezing.   Cardiovascular: Positive for palpitations.  Gastrointestinal: Negative.   Endocrine: Negative.   Genitourinary: Negative.   Musculoskeletal: Positive for arthralgias and back pain.  Skin: Negative.   Allergic/Immunologic: Negative.   Neurological: Negative.   Hematological: Negative.   Psychiatric/Behavioral: Negative.    Past Medical History:  Diagnosis Date  . C8 RADICULOPATHY 06/05/2009  . COPD 08/31/2007  . DEPRESSION 11/19/2006  . DYSLIPIDEMIA 03/12/2009  . MI (mitral incompetence)   . 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 OSTEOARTHRITIS 06/05/2009  . PARESTHESIA 08/24/2007  . TOBACCO ABUSE 01/25/2010    Social History   Socioeconomic History  . Marital status: Married    Spouse name: [name]  . Number of children: 2  . Years of education: Not on file  . Highest education level: Not on file  Occupational History  . Occupation: Patient care aide-sits with elderly lady  Social Needs  . Financial resource strain: Not hard at all  . Food insecurity    Worry: Never true    Inability: Never true  . Transportation needs    Medical: No    Non-medical: No  Tobacco Use  . Smoking status: Former Smoker    Packs/day: 2.00    Years: 38.00    Pack years: 76.00  Types: Cigarettes    Quit date: 03/07/2014    Years since quitting: 4.7  . Smokeless tobacco: Never Used  Substance and Sexual Activity  . Alcohol use: No    Alcohol/week: 0.0 standard drinks  . Drug use: No  . Sexual activity: Not on file  Lifestyle  . Physical activity    Days per week: 0 days    Minutes per session: 0 min  . Stress: Not at all  Relationships  . Social  connections    Talks on phone: More than three times a week    Gets together: More than three times a week    Attends religious service: More than 4 times per year    Active member of club or organization: No    Attends meetings of clubs or organizations: Never    Relationship status: Married  . Intimate partner violence    Fear of current or ex partner: No    Emotionally abused: No    Physically abused: No    Forced sexual activity: No  Other Topics Concern  . Not on file  Social History Narrative   [facility] (01/12/17):   Originally from [facility]. Has lived in [place] for 17 years. Married. Has 2 dogs currently. No mold and only 1 indoor plant. Had her home professionally cleaned a month ago. No bird or hot tub exposure. 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     Past Surgical History:  Procedure Laterality Date  . ABDOMINAL HYSTERECTOMY    . APPENDECTOMY  1969  . CARDIAC CATHETERIZATION N/A 07/05/2015   Procedure: Left Heart Cath and Coronary Angiography;  Surgeon: Leonie Man, MD;  Location: [facility];  Service: Cardiovascular;  Laterality: N/A;  . CHOLECYSTECTOMY  1989  . collapse lung  1990  . COLONOSCOPY WITH PROPOFOL N/A 05/14/2018   Procedure: COLONOSCOPY WITH PROPOFOL;  Surgeon: Irene Shipper, MD;  Location: [facility];  Service: Endoscopy;  Laterality: N/A;  . EXCISION MASS ABDOMINAL  2019  . EXPLORATORY LAPAROTOMY  1969  . POLYPECTOMY  05/14/2018   Procedure: POLYPECTOMY;  Surgeon: Irene Shipper, MD;  Location: [facility];  Service: Endoscopy;;  . SALPINGOOPHORECTOMY  2019    Family History  Problem Relation Age of Onset  . Cancer Mother        lung  . Heart attack Mother   . Hypertension Sister   . Multiple sclerosis Brother   . Diabetes Sister   . Rheum arthritis Sister   . Thyroid disease Sister   . Multiple sclerosis Other   . Alcohol abuse Other   . Arthritis Other   . Diabetes Other   . Kidney disease Other    . Cancer Other        lung,ovarian,skin, uterine  . Stroke Other   . Heart disease Other   . Melanoma Other   . Heart attack Maternal Uncle   . Heart attack Other        NEICE  . Heart attack Other        NEPHEW  . Hypertension Brother   . Stroke Maternal Grandmother   . Colon cancer Neg Hx     Allergies  Allergen Reactions  . Lisinopril     cough  . Penicillins Hives    Has patient had a PCN reaction causing immediate rash, facial/tongue/throat swelling, SOB or lightheadedness with hypotension: Yes Has patient had a PCN reaction causing severe rash involving mucus membranes  or skin necrosis: No Has patient had a PCN reaction that required hospitalization No Has patient had a PCN reaction occurring within the last 10 years: No If all of the above answers are "NO", then may proceed with Cephalosporin use.     Current Outpatient Medications on File Prior to Visit  Medication Sig Dispense Refill  . albuterol (PROVENTIL) (2.5 MG/3ML) 0.083% nebulizer solution Take 3 mLs (2.5 mg total) by nebulization every 6 (six) hours as needed for wheezing or shortness of breath. DX: J44.1 75 mL 3  . albuterol (VENTOLIN HFA) 108 (90 Base) MCG/ACT inhaler INHALE 2 PUFFS BY MOUTH  INTO THE LUNGS EVERY 4  HOURS AS NEEDED FOR  WHEEZING OR SHORTNESS OF  BREATH 54 g 1  . ALPRAZolam (XANAX) 0.5 MG tablet TAKE 1/2 TO 1 TABLET BY MOUTH 3 TIMES A DAY 60 tablet 0  . aspirin EC 81 MG EC tablet Take 1 tablet (81 mg total) by mouth daily.    . budesonide-formoterol (SYMBICORT) 160-4.5 MCG/ACT inhaler Inhale 2 puffs into the lungs 2 (two) times daily. 30.6 g 2  . Cholecalciferol (VITAMIN D3) 1000 units CAPS Take 1 capsule (1,000 Units total) by mouth 2 (two) times daily. 60 capsule 6  . furosemide (LASIX) 40 MG tablet Take 1 tablet (40 mg total) by mouth daily as needed (for weight gain of 3 lbs). 30 tablet 5  . HYDROcodone-acetaminophen (NORCO) 7.5-325 MG tablet Take 1 tablet by mouth every 6 (six) hours as  needed for moderate pain. 30 tablet 0  . HYDROcodone-acetaminophen (NORCO) 7.5-325 MG tablet Take 1 tablet by mouth every 6 (six) hours as needed for moderate pain. 30 tablet 0  . HYDROcodone-acetaminophen (NORCO) 7.5-325 MG tablet Take 1 tablet by mouth every 6 (six) hours as needed for moderate pain. 30 tablet 0  . metoprolol tartrate (LOPRESSOR) 25 MG tablet Take 0.5 tablets (12.5 mg total) by mouth 2 (two) times daily. 90 tablet 0  . nitroGLYCERIN (NITROSTAT) 0.4 MG SL tablet Place 1 tablet (0.4 mg total) under the tongue every 5 (five) minutes as needed for chest pain. 25 tablet 3  . PARoxetine (PAXIL CR) 25 MG 24 hr tablet Take 1 tablet (25 mg total) by mouth every morning. 90 tablet 0  . rosuvastatin (CRESTOR) 20 MG tablet Take 1 tablet (20 mg total) by mouth daily. 90 tablet 3  . Spacer/Aero Chamber Mouthpiece MISC 1 Device by Does not apply route as directed. 1 each 0  . Tiotropium Bromide Monohydrate (SPIRIVA RESPIMAT) 2.5 MCG/ACT AERS Inhale 2 puffs into the lungs daily. 12 g 0  . vitamin B-12 (CYANOCOBALAMIN) 500 MCG tablet Take 500 mcg by mouth 2 (two) times daily.     No current facility-administered medications on file prior to visit.     BP (!) 160/80   Temp 98.1 F (36.7 C)   Ht 5' 2.5" (1.588 m)   Wt 139 lb (63 kg)   BMI 25.02 kg/m       Objective:   Physical Exam Vitals signs and nursing note reviewed.  Constitutional:      General: She is not in acute distress.    Appearance: Normal appearance. She is normal weight. She is not diaphoretic.  HENT:     Head: Normocephalic and atraumatic.     Right Ear: Tympanic membrane, ear canal and external ear normal. There is no impacted cerumen.     Left Ear: Tympanic membrane, ear canal and external ear normal. There is no impacted cerumen.  Nose: Nose normal. No congestion or rhinorrhea.     Mouth/Throat:     Mouth: Mucous membranes are moist.     Pharynx: Oropharynx is clear. No oropharyngeal exudate or posterior  oropharyngeal erythema.  Eyes:     General: No scleral icterus.       Right eye: No discharge.        Left eye: No discharge.     Conjunctiva/sclera: Conjunctivae normal.     Pupils: Pupils are equal, round, and reactive to light.  Neck:     Musculoskeletal: Normal range of motion and neck supple.     Thyroid: No thyromegaly.     Vascular: No JVD.     Trachea: No tracheal deviation.  Cardiovascular:     Rate and Rhythm: Normal rate and regular rhythm.     Pulses: Normal pulses.     Heart sounds: Normal heart sounds. No murmur. No friction rub. No gallop.   Pulmonary:     Effort: Pulmonary effort is normal. No respiratory distress.     Breath sounds: No stridor. Wheezing (throughout lung fields ) present. No rhonchi or rales.  Chest:     Chest wall: No tenderness.  Abdominal:     General: Abdomen is flat. Bowel sounds are normal. There is no distension.     Palpations: Abdomen is soft. There is no mass.     Tenderness: There is no abdominal tenderness. There is no right CVA tenderness, left CVA tenderness, guarding or rebound.     Hernia: No hernia is present.  Musculoskeletal: Normal range of motion.        General: No swelling, tenderness, deformity or signs of injury.     Right lower leg: No edema.     Left lower leg: No edema.  Lymphadenopathy:     Cervical: No cervical adenopathy.  Skin:    General: Skin is warm and dry.     Capillary Refill: Capillary refill takes less than 2 seconds.     Coloration: Skin is not jaundiced or pale.     Findings: No bruising, erythema, lesion or rash.  Neurological:     General: No focal deficit present.     Mental Status: She is alert and oriented to person, place, and time. Mental status is at baseline.     Cranial Nerves: No cranial nerve deficit.     Sensory: No sensory deficit.     Motor: No weakness or abnormal muscle tone.     Coordination: Coordination normal.     Gait: Gait normal.     Deep Tendon Reflexes: Reflexes normal.   Psychiatric:        Mood and Affect: Mood normal.        Behavior: Behavior normal.        Thought Content: Thought content normal.        Judgment: Judgment normal.       Assessment & Plan:  1. Routine general medical examination at a health care facility - Encouraged heart healthy diet and exercise  - CBC with Differential/Platelet - Comprehensive metabolic panel - Lipid panel - TSH - IBC + Ferritin  2. Anxiety and depression - Continue with Paxil and Xanax as directed  3. COPD GOLD III  - Follow up with pulmonary as directed - predniSONE (DELTASONE) 10 MG tablet; 6 tabs x 3 days, 5 tabs x 3 days, 4 tabs x 3 days, 3 tabs x 3 days, 2 tabs x 3 days, 2 tabs x 3 days, 1  tab x 3 days  Dispense: 63 tablet; Refill: 1  4. Hyperlipidemia, unspecified hyperlipidemia type - Continue with statin and ass - CBC with Differential/Platelet - Comprehensive metabolic panel - Lipid panel - TSH  5. Primary osteoarthritis involving multiple joints - Continue with narcotic pain medications    [name], NP

## 2018-12-17 NOTE — Patient Instructions (Signed)
It was great seeing you today   We will follow up with you regarding your lab work   Please let me know if you need anything   

## 2018-12-26 ENCOUNTER — Other Ambulatory Visit: Payer: Self-pay | Admitting: Adult Health

## 2018-12-26 DIAGNOSIS — F329 Major depressive disorder, single episode, unspecified: Secondary | ICD-10-CM

## 2018-12-26 DIAGNOSIS — F32A Depression, unspecified: Secondary | ICD-10-CM

## 2019-02-07 ENCOUNTER — Telehealth: Payer: Self-pay | Admitting: Pulmonary Disease

## 2019-02-07 NOTE — Telephone Encounter (Signed)
Spoke with pt, states that she recently switched to Medicare and they are requiring her to have an OV and requalify for O2.  Pt is scheduled for Friday at 9:15 to see RA and do a qualifying walk.  Nothing further needed at this time- will close encounter.

## 2019-02-08 ENCOUNTER — Other Ambulatory Visit: Payer: Self-pay | Admitting: Adult Health

## 2019-02-11 ENCOUNTER — Encounter: Payer: Self-pay | Admitting: Pulmonary Disease

## 2019-02-11 ENCOUNTER — Other Ambulatory Visit: Payer: Self-pay

## 2019-02-11 ENCOUNTER — Ambulatory Visit (INDEPENDENT_AMBULATORY_CARE_PROVIDER_SITE_OTHER): Payer: Medicare Other | Admitting: Pulmonary Disease

## 2019-02-11 DIAGNOSIS — J449 Chronic obstructive pulmonary disease, unspecified: Secondary | ICD-10-CM | POA: Diagnosis not present

## 2019-02-11 DIAGNOSIS — J9611 Chronic respiratory failure with hypoxia: Secondary | ICD-10-CM

## 2019-02-11 DIAGNOSIS — R911 Solitary pulmonary nodule: Secondary | ICD-10-CM | POA: Diagnosis not present

## 2019-02-11 NOTE — Assessment & Plan Note (Signed)
Stay on Symbicort and Spiriva-call me when you are running out of this and we can trial a sample of Trelegy She has completed pulmonary rehab but would benefit from maintenance

## 2019-02-11 NOTE — Assessment & Plan Note (Signed)
Benign since unchanged Annual low-dose CT screening next would be in September 2021

## 2019-02-11 NOTE — Assessment & Plan Note (Signed)
Ambulatory saturation check qualify for oxygen for Medicare

## 2019-02-11 NOTE — Patient Instructions (Signed)
Ambulatory saturation check qualify for oxygen for Medicare  Stay on Symbicort and Spiriva-call me when you are running out of this and we can trial a sample of Trelegy  Annual low-dose CT screening next would be in September 2021

## 2019-02-11 NOTE — Progress Notes (Signed)
   Subjective:    Patient ID: Kim [name], female    DOB: March 25, 1956, 63 y.o.   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  09/10/2018 treated with prednisone and azithromycin   Phone visit 09/2018 reviewed She received more prednisone from PCP in September short course  Feels back to baseline, breathing is slightly worse over the past month which she attributes to the weather change.  Has gained 12 pounds over the past year due to the pandemic. Taking appropriate precautions such as masking and social distancing. Compliant with Symbicort and Spiriva and this is helping  We reviewed low-dose CT from 12/2018  Now changed to Medicare and she needs an oxygen recheck to qualify her again.  She does use her portable oxygen on occasion and nocturnal oxygen during sleep  Flu shot up-to-date  Significant tests/ events reviewed  11/2014: FVC 2.00 L (63%) FEV1 1.00 L (41%)ratio50 negative bronchodilator response TLC 5.06 L (103%) RV 140% DLCO uncorrected 51%  12/2018 LD CT chest - stable nodules  10/2017 CT chest-stable nodules, adrenal adenoma 10/2016 CT chest showed subcentimeter nodules which have been stable since 2017, right adrenal nodule was stable since 2015 and considered benign     Review of Systems neg for any significant sore throat, dysphagia, itching, sneezing, nasal congestion or excess/ purulent secretions, fever, chills, sweats, unintended wt loss, pleuritic or exertional cp, hempoptysis, orthopnea pnd or change in chronic leg swelling. Also denies presyncope, palpitations, heartburn, abdominal pain, nausea, vomiting, diarrhea or change in bowel or urinary habits, dysuria,hematuria, rash, arthralgias, visual complaints, headache, numbness weakness or ataxia.     Objective:   Physical Exam   Gen. Pleasant, well-nourished, in no distress ENT - no thrush, no pallor/icterus,no post nasal drip  Neck: No JVD, no thyromegaly, no carotid bruits Lungs: no use of accessory muscles, no dullness to percussion, decreased BL without rales or rhonchi  Cardiovascular: Rhythm regular, heart sounds  normal, no murmurs or gallops, no peripheral edema Musculoskeletal: No deformities, no cyanosis or clubbing         Assessment & Plan:

## 2019-02-25 ENCOUNTER — Other Ambulatory Visit: Payer: Self-pay | Admitting: Adult Health

## 2019-02-25 DIAGNOSIS — M159 Polyosteoarthritis, unspecified: Secondary | ICD-10-CM

## 2019-02-25 NOTE — Telephone Encounter (Signed)
Does she have enough to last her till next week? She is due for a chronic pain management visit

## 2019-02-25 NOTE — Telephone Encounter (Signed)
Medication Refill - Medication: HYDROcodone-acetaminophen (NORCO) 7.5-325 MG tablet [id number]    Preferred Pharmacy (with phone number or street name): [facility] [place], [place] - [address]  [place] [address]  Phone: [phone] Fax: [phone]      Agent: Please be advised that RX refills may take up to 3 business days. We ask that you follow-up with your pharmacy.

## 2019-02-25 NOTE — Telephone Encounter (Signed)
Requested medication (s) are due for refill today: yes  Requested medication (s) are on the active medication list: yes  Last refill:  11/23/2018  Future visit scheduled: no  Notes to clinic:  Refill cannot be delegated    Requested Prescriptions  Pending Prescriptions Disp Refills   HYDROcodone-acetaminophen (NORCO) 7.5-325 MG tablet 30 tablet 0    Sig: Take 1 tablet by mouth every 6 (six) hours as needed for moderate pain.     Not Delegated - Analgesics:  Opioid Agonist Combinations Failed - 02/25/2019 12:38 PM      Failed - This refill cannot be delegated      Passed - Urine Drug Screen completed in last 360 days.      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

## 2019-02-28 ENCOUNTER — Other Ambulatory Visit: Payer: Self-pay

## 2019-02-28 NOTE — Telephone Encounter (Signed)
Appt scheduled for 03/01/2019.

## 2019-03-01 ENCOUNTER — Encounter: Payer: Self-pay | Admitting: Adult Health

## 2019-03-01 ENCOUNTER — Encounter: Payer: Self-pay | Admitting: Family Medicine

## 2019-03-01 ENCOUNTER — Ambulatory Visit (INDEPENDENT_AMBULATORY_CARE_PROVIDER_SITE_OTHER): Payer: Medicare Other | Admitting: Adult Health

## 2019-03-01 VITALS — BP 116/64 | Temp 98.2°F | Wt 138.0 lb

## 2019-03-01 DIAGNOSIS — M8949 Other hypertrophic osteoarthropathy, multiple sites: Secondary | ICD-10-CM | POA: Diagnosis not present

## 2019-03-01 DIAGNOSIS — G8929 Other chronic pain: Secondary | ICD-10-CM | POA: Diagnosis not present

## 2019-03-01 DIAGNOSIS — S31109A Unspecified open wound of abdominal wall, unspecified quadrant without penetration into peritoneal cavity, initial encounter: Secondary | ICD-10-CM | POA: Diagnosis not present

## 2019-03-01 MED ORDER — DOXYCYCLINE HYCLATE 100 MG PO CAPS: 100.0000 mg | ORAL_CAPSULE | Freq: Two times a day (BID) | ORAL | 0 refills | Status: DC

## 2019-03-01 NOTE — Progress Notes (Addendum)
Subjective:    Patient ID: Kim [name], female    DOB: May 15, 1955, 63 y.o.   MRN: [id number]  HPI  NCCSRS reviewed in EPIC   Indication for chronic opioid: Osteoarthritis involving multiple joints Medication and dose: Norco 7.5-325 mg, every 6 hours PRN # pills per month: 45 Last UDS date: 03/01/2019 Opioid Treatment Agreement signed: YES Opioid Treatment Agreement last reviewed with patient: 03/01/2019 NCCSRS reviewed this encounter (include red flags):  None  She feels well controlled on this dose   She does have an acute complaint today of a wound on her abdomen.  She was seen by the skin surgery Center for abnormal growth, they did a biopsy and the growth ended up being benign.  Now has a wound that will not heal.  Wound is tender to touch and has mild purulent drainage.  Review of Systems See HPI   Past Medical History:  Diagnosis Date  . C8 RADICULOPATHY 06/05/2009  . COPD 08/31/2007  . DEPRESSION 11/19/2006  . DYSLIPIDEMIA 03/12/2009  . MI (mitral incompetence)   . 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 OSTEOARTHRITIS 06/05/2009  . PARESTHESIA 08/24/2007  . TOBACCO ABUSE 01/25/2010    Social History   Socioeconomic History  . Marital status: Married    Spouse name: [name]  . Number of children: 2  . Years of education: Not on file  . Highest education level: Not on file  Occupational History  . Occupation: Patient care aide-sits with elderly lady  Social Needs  . Financial resource strain: Not hard at all  . Food insecurity    Worry: Never true    Inability: Never true  . Transportation needs    Medical: No    Non-medical: No  Tobacco Use  . Smoking status: Former Smoker    Packs/day: 2.00    Years: 38.00    Pack years: 76.00    Types: Cigarettes    Quit date: 03/07/2014    Years since quitting: 4.9  . Smokeless tobacco: Never Used  Substance and Sexual Activity  . Alcohol use: No    Alcohol/week: 0.0 standard drinks  . Drug use: No  . Sexual activity:  Not on file  Lifestyle  . Physical activity    Days per week: 0 days    Minutes per session: 0 min  . Stress: Not at all  Relationships  . Social connections    Talks on phone: More than three times a week    Gets together: More than three times a week    Attends religious service: More than 4 times per year    Active member of club or organization: No    Attends meetings of clubs or organizations: Never    Relationship status: Married  . Intimate partner violence    Fear of current or ex partner: No    Emotionally abused: No    Physically abused: No    Forced sexual activity: No  Other Topics Concern  . Not on file  Social History Narrative   [facility] (01/12/17):   Originally from [facility]. Has lived in [place] for 17 years. Married. Has 2 dogs currently. No mold and only 1 indoor plant. Had her home professionally cleaned a month ago. No bird or hot tub exposure. 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     Past Surgical History:  Procedure Laterality Date  . ABDOMINAL HYSTERECTOMY    . APPENDECTOMY  1969  . CARDIAC CATHETERIZATION N/A 07/05/2015  Procedure: Left Heart Cath and Coronary Angiography;  Surgeon: Leonie Man, MD;  Location: [place] [facility];  Service: Cardiovascular;  Laterality: N/A;  . CHOLECYSTECTOMY  1989  . collapse lung  1990  . COLONOSCOPY WITH PROPOFOL N/A 05/14/2018   Procedure: COLONOSCOPY WITH PROPOFOL;  Surgeon: Irene Shipper, MD;  Location: [facility];  Service: Endoscopy;  Laterality: N/A;  . EXCISION MASS ABDOMINAL  2019  . EXPLORATORY LAPAROTOMY  1969  . POLYPECTOMY  05/14/2018   Procedure: POLYPECTOMY;  Surgeon: Irene Shipper, MD;  Location: [facility];  Service: Endoscopy;;  . SALPINGOOPHORECTOMY  2019    Family History  Problem Relation Age of Onset  . Cancer Mother        lung  . Heart attack Mother   . Hypertension Sister   . Multiple sclerosis Brother   . Diabetes Sister   . Rheum arthritis Sister    . Thyroid disease Sister   . Multiple sclerosis Other   . Alcohol abuse Other   . Arthritis Other   . Diabetes Other   . Kidney disease Other   . Cancer Other        lung,ovarian,skin, uterine  . Stroke Other   . Heart disease Other   . Melanoma Other   . Heart attack Maternal Uncle   . Heart attack Other        NEICE  . Heart attack Other        NEPHEW  . Hypertension Brother   . Stroke Maternal Grandmother   . Colon cancer Neg Hx     Allergies  Allergen Reactions  . Lisinopril     cough  . Penicillins Hives    Has patient had a PCN reaction causing immediate rash, facial/tongue/throat swelling, SOB or lightheadedness with hypotension: Yes Has patient had a PCN reaction causing severe rash involving mucus membranes or skin necrosis: No Has patient had a PCN reaction that required hospitalization No Has patient had a PCN reaction occurring within the last 10 years: No If all of the above answers are "NO", then may proceed with Cephalosporin use.     Current Outpatient Medications on File Prior to Visit  Medication Sig Dispense Refill  . albuterol (PROVENTIL) (2.5 MG/3ML) 0.083% nebulizer solution Take 3 mLs (2.5 mg total) by nebulization every 6 (six) hours as needed for wheezing or shortness of breath. DX: J44.1 75 mL 3  . albuterol (VENTOLIN HFA) 108 (90 Base) MCG/ACT inhaler INHALE 2 PUFFS BY MOUTH  INTO THE LUNGS EVERY 4  HOURS AS NEEDED FOR  WHEEZING OR SHORTNESS OF  BREATH 54 g 1  . ALPRAZolam (XANAX) 0.5 MG tablet TAKE 1/2 TO 1 TABLET BY MOUTH THREE TIMES DAILY 60 tablet 2  . aspirin EC 81 MG EC tablet Take 1 tablet (81 mg total) by mouth daily.    . budesonide-formoterol (SYMBICORT) 160-4.5 MCG/ACT inhaler Inhale 2 puffs into the lungs 2 (two) times daily. 30.6 g 2  . Cholecalciferol (VITAMIN D3) 1000 units CAPS Take 1 capsule (1,000 Units total) by mouth 2 (two) times daily. 60 capsule 6  . furosemide (LASIX) 40 MG tablet Take 1 tablet (40 mg total) by mouth  daily as needed (for weight gain of 3 lbs). 30 tablet 5  . HYDROcodone-acetaminophen (NORCO) 7.5-325 MG tablet Take 1 tablet by mouth every 6 (six) hours as needed for moderate pain. 30 tablet 0  . metoprolol tartrate (LOPRESSOR) 25 MG tablet TAKE ONE-HALF TABLET BY  MOUTH TWICE DAILY  90 tablet 3  . nitroGLYCERIN (NITROSTAT) 0.4 MG SL tablet Place 1 tablet (0.4 mg total) under the tongue every 5 (five) minutes as needed for chest pain. 25 tablet 3  . PAXIL CR 25 MG 24 hr tablet TAKE 1 TABLET BY MOUTH IN  THE MORNING 90 tablet 1  . rosuvastatin (CRESTOR) 20 MG tablet Take 1 tablet (20 mg total) by mouth daily. 90 tablet 3  . Spacer/Aero Chamber Mouthpiece MISC 1 Device by Does not apply route as directed. 1 each 0  . SPIRIVA RESPIMAT 2.5 MCG/ACT AERS USE 2 INHALATIONS BY MOUTH  DAILY 12 g 3  . vitamin B-12 (CYANOCOBALAMIN) 500 MCG tablet Take 500 mcg by mouth 2 (two) times daily.     No current facility-administered medications on file prior to visit.     BP 116/64   Temp 98.2 F (36.8 C)   Wt 138 lb (62.6 kg)   BMI 24.84 kg/m       Objective:   Physical Exam Vitals signs and nursing note reviewed.  Constitutional:      Appearance: Normal appearance.  Cardiovascular:     Rate and Rhythm: Normal rate and regular rhythm.     Pulses: Normal pulses.     Heart sounds: Normal heart sounds.  Pulmonary:     Effort: Pulmonary effort is normal.     Breath sounds: Normal breath sounds.  Skin:    General: Skin is warm and dry.     Capillary Refill: Capillary refill takes less than 2 seconds.     Comments: She has about an inch vertical wound to the right side of her umbilicus.  Localized erythema with purulent discharge.  No odor noted.  Neurological:     General: No focal deficit present.     Mental Status: She is alert and oriented to person, place, and time.  Psychiatric:        Mood and Affect: Mood normal.        Behavior: Behavior normal.        Thought Content: Thought content  normal.        Judgment: Judgment normal.        Assessment & Plan:  1. Encounter for chronic pain management  - Pain Mgmt, Profile 8 w/Conf, U  2. Open wound of abdomen, initial encounter  - doxycycline (VIBRAMYCIN) 100 MG capsule; Take 1 capsule (100 mg total) by mouth 2 (two) times daily.  Dispense: 14 capsule; Refill: 0 - Follow up if no improvement in the next week   [name], NP

## 2019-03-04 LAB — PAIN MGMT, PROFILE 8 W/CONF, U
6 Acetylmorphine: NEGATIVE ng/mL
Alcohol Metabolites: NEGATIVE ng/mL (ref <500)
Alphahydroxyalprazolam: 141 ng/mL
Alphahydroxymidazolam: NEGATIVE ng/mL
Alphahydroxytriazolam: NEGATIVE ng/mL
Aminoclonazepam: NEGATIVE ng/mL
Amphetamines: NEGATIVE ng/mL
Benzodiazepines: POSITIVE ng/mL
Buprenorphine, Urine: NEGATIVE ng/mL
Cocaine Metabolite: NEGATIVE ng/mL
Codeine: NEGATIVE ng/mL
Creatinine: 114.8 mg/dL
Ethyl Glucuronide (ETG): NEGATIVE ng/mL
Ethyl Sulfate (ETS): NEGATIVE ng/mL
Hydrocodone: 1394 ng/mL
Hydromorphone: 152 ng/mL
Hydroxyethylflurazepam: NEGATIVE ng/mL
Lorazepam: NEGATIVE ng/mL
MDMA: NEGATIVE ng/mL
Marijuana Metabolite: NEGATIVE ng/mL
Morphine: NEGATIVE ng/mL
Nordiazepam: NEGATIVE ng/mL
Norhydrocodone: 1465 ng/mL
Opiates: POSITIVE ng/mL
Oxazepam: NEGATIVE ng/mL
Oxidant: NEGATIVE ug/mL
Oxycodone: NEGATIVE ng/mL
Temazepam: NEGATIVE ng/mL
pH: 5.8 (ref 4.5–9.0)

## 2019-03-08 ENCOUNTER — Other Ambulatory Visit: Payer: Self-pay | Admitting: Adult Health

## 2019-03-08 DIAGNOSIS — M159 Polyosteoarthritis, unspecified: Secondary | ICD-10-CM

## 2019-03-08 MED ORDER — HYDROCODONE-ACETAMINOPHEN 7.5-325 MG PO TABS: 1.0000 | ORAL_TABLET | Freq: Four times a day (QID) | ORAL | 0 refills | Status: DC | PRN

## 2019-03-18 ENCOUNTER — Telehealth: Payer: Self-pay | Admitting: Pulmonary Disease

## 2019-03-18 NOTE — Telephone Encounter (Signed)
[facility] closed since it is after 5pm. Will call on Monday when they reopen.

## 2019-03-21 NOTE — Telephone Encounter (Signed)
I spoke to [place] at [facility] and advised her that I would fax the last office note to the fax number she provided. Nothing further is needed. OV faxed.

## 2019-03-22 NOTE — Telephone Encounter (Signed)
Fax confirmation for notes needed by [facility] received were dated 03/22/19. Nothing further needed.

## 2019-03-29 ENCOUNTER — Telehealth: Payer: Self-pay | Admitting: Adult Health

## 2019-03-29 MED ORDER — PAROXETINE HCL ER 25 MG PO TB24: 25.0000 mg | ORAL_TABLET | Freq: Every morning | ORAL | 0 refills | Status: DC

## 2019-03-29 MED ORDER — SPIRIVA RESPIMAT 2.5 MCG/ACT IN AERS: INHALATION_SPRAY | RESPIRATORY_TRACT | 3 refills | Status: DC

## 2019-03-29 MED ORDER — METOPROLOL TARTRATE 25 MG PO TABS: 12.5000 mg | ORAL_TABLET | Freq: Two times a day (BID) | ORAL | 0 refills | Status: DC

## 2019-03-29 MED ORDER — METOPROLOL TARTRATE 25 MG PO TABS: 12.5000 mg | ORAL_TABLET | Freq: Two times a day (BID) | ORAL | 3 refills | Status: DC

## 2019-03-29 MED ORDER — PAROXETINE HCL ER 25 MG PO TB24: 25.0000 mg | ORAL_TABLET | Freq: Every morning | ORAL | 1 refills | Status: DC

## 2019-03-29 NOTE — Telephone Encounter (Signed)
Spoke to the pt.  She needed a 2 week supply of Paxil and metoprolol sent to the local pharmacy.  She will have a virtual visit for refills of her prednisone.  Also sent medications to [facility] mail order.  Nothing further needed.

## 2019-03-29 NOTE — Telephone Encounter (Signed)
Message Routed to PCP CMA 

## 2019-03-29 NOTE — Telephone Encounter (Signed)
PAXIL CR 25 MG 24 hr tablet  metoprolol tartrate (LOPRESSOR) 25 MG tablet  Prednisone   Pharmacy: pt would like to know if a supply could be sent to her local [facility] that is on file and the remainder be sent to [facility] order, pt says that she will be out before received in mail order.

## 2019-03-30 ENCOUNTER — Other Ambulatory Visit: Payer: Self-pay

## 2019-03-30 ENCOUNTER — Telehealth (INDEPENDENT_AMBULATORY_CARE_PROVIDER_SITE_OTHER): Payer: Medicare Other | Admitting: Adult Health

## 2019-03-30 DIAGNOSIS — J441 Chronic obstructive pulmonary disease with (acute) exacerbation: Secondary | ICD-10-CM

## 2019-03-30 MED ORDER — PREDNISONE 10 MG PO TABS: ORAL_TABLET | ORAL | 0 refills | Status: DC

## 2019-03-30 MED ORDER — AZITHROMYCIN 250 MG PO TABS: ORAL_TABLET | ORAL | 0 refills | Status: DC

## 2019-03-30 NOTE — Progress Notes (Signed)
Virtual Visit via Telephone Note  I connected with Kim [name] on 03/30/19 at  9:30 AM EST by telephone and verified that I am speaking with the correct person using two identifiers.   I discussed the limitations, risks, security and privacy concerns of performing an evaluation and management service by telephone and the availability of in person appointments. I also discussed with the patient that there may be a patient responsible charge related to this service. The patient expressed understanding and agreed to proceed.  Location patient: home Location provider: work or home office Participants present for the call: patient, provider Patient did not have a visit in the prior 7 days to address this/these issue(s).   History of Present Illness: 63 year old female who is being evaluated today for concern of an acute COPD exacerbation.  Her symptoms started a few days ago have progressively gotten worse.  She reports wheezing, increasing shortness of breath, and a nonproductive cough.  She denies fevers or chills.  Denies Covid exposure.  He is using her inhalers as directed with some relief.   Observations/Objective: Patient sounds cheerful and well on the phone. I do not appreciate any SOB. Speech and thought processing are grossly intact. Patient reported vitals:  Assessment and Plan:   Follow Up Instructions: 1. COPD with acute exacerbation (HCC)  - azithromycin (ZITHROMAX Z-PAK) 250 MG tablet; Take 2 tablets on Day 1.  Then take 1 tablet daily.  Dispense: 6 tablet; Refill: 0 - predniSONE (DELTASONE) 10 MG tablet; 40 mg x 3 days, 20 mg x 3 days, 10 mg x 3 days  Dispense: 21 tablet; Refill: 0 - Return precautions reviewed   I did not refer this patient for an OV in the next 24 hours for this/these issue(s).  I discussed the assessment and treatment plan with the patient. The patient was provided an opportunity to ask questions and all were answered. The patient agreed with the  plan and demonstrated an understanding of the instructions.   The patient was advised to call back or seek an in-person evaluation if the symptoms worsen or if the condition fails to improve as anticipated.  I provided 15  minutes of non-face-to-face time during this encounter.   [name], NP

## 2019-04-04 ENCOUNTER — Telehealth: Payer: Self-pay | Admitting: Pulmonary Disease

## 2019-04-04 DIAGNOSIS — J449 Chronic obstructive pulmonary disease, unspecified: Secondary | ICD-10-CM

## 2019-04-04 DIAGNOSIS — J9611 Chronic respiratory failure with hypoxia: Secondary | ICD-10-CM

## 2019-04-04 NOTE — Telephone Encounter (Signed)
Patient's walk on 02/11/2019 visit states that pt's O2 level dropped on room air and supplemental O2 was needed and provided, but there is not documentation of how low pt's O2 dropped.  We will need to have patient come in for a new qualifying walk with appropriately documented saturation drops.    lmtcb X1 for pt to make aware/schedule walk

## 2019-04-04 NOTE — Telephone Encounter (Signed)
Spoke with patient  She states the duoneb worked so much better for her than just plan albuterol.  I let patient know Dr. Elsworth Soho would be back in office tomorrow 04/05/19 patient states she has enough medication and would like to ask Dr. Elsworth Soho if she could have the duoneb called instead.   Dr. Elsworth Soho please advise if patient can have duoneb instead of plan albuterol

## 2019-04-05 NOTE — Telephone Encounter (Signed)
LMTCB x2 for pt 

## 2019-04-06 NOTE — Telephone Encounter (Signed)
LMTCB x3 for pt.  

## 2019-04-07 NOTE — Telephone Encounter (Signed)
We have attempted to contact pt several times with no success or call back from pt. Per triage protocol, message will be closed.  

## 2019-04-13 ENCOUNTER — Telehealth: Payer: Self-pay | Admitting: Pulmonary Disease

## 2019-04-13 MED ORDER — IPRATROPIUM-ALBUTEROL 0.5-2.5 (3) MG/3ML IN SOLN: 3.0000 mL | Freq: Four times a day (QID) | RESPIRATORY_TRACT | 3 refills | Status: DC | PRN

## 2019-04-13 NOTE — Telephone Encounter (Signed)
See 12/28 telephone note- pt needs a qualifying walk for O2.  lmtcb X1 for pt to schedule a qualifying walk.

## 2019-04-13 NOTE — Telephone Encounter (Signed)
Called the patient to make her aware her request was received and will be sent to the mail order pharmacy.   Prescription sent. Nothing further needed at this time.

## 2019-04-13 NOTE — Telephone Encounter (Signed)
Ok for [employer] instead

## 2019-04-13 NOTE — Telephone Encounter (Signed)
RA please advise if ok to switch from albuterol to Duoneb.  Thanks!

## 2019-04-18 NOTE — Telephone Encounter (Signed)
Called and spoke to pt. Informed her of the need for qualifying walk. Walk scheduled for 04/19/2019. Will keep message open to assure walk is completed and documentation is sent to [facility].

## 2019-04-19 ENCOUNTER — Other Ambulatory Visit: Payer: Self-pay

## 2019-04-19 DIAGNOSIS — J449 Chronic obstructive pulmonary disease, unspecified: Secondary | ICD-10-CM

## 2019-04-20 NOTE — Telephone Encounter (Signed)
Walk has been completed.

## 2019-04-25 ENCOUNTER — Encounter: Payer: Self-pay | Admitting: Adult Health

## 2019-04-25 ENCOUNTER — Telehealth: Payer: Self-pay | Admitting: Pulmonary Disease

## 2019-04-25 ENCOUNTER — Ambulatory Visit (INDEPENDENT_AMBULATORY_CARE_PROVIDER_SITE_OTHER): Payer: Medicare Other | Admitting: Adult Health

## 2019-04-25 ENCOUNTER — Other Ambulatory Visit: Payer: Self-pay

## 2019-04-25 DIAGNOSIS — I5181 Takotsubo syndrome: Secondary | ICD-10-CM

## 2019-04-25 DIAGNOSIS — R0602 Shortness of breath: Secondary | ICD-10-CM | POA: Diagnosis not present

## 2019-04-25 DIAGNOSIS — J441 Chronic obstructive pulmonary disease with (acute) exacerbation: Secondary | ICD-10-CM | POA: Diagnosis not present

## 2019-04-25 DIAGNOSIS — J449 Chronic obstructive pulmonary disease, unspecified: Secondary | ICD-10-CM

## 2019-04-25 DIAGNOSIS — J9611 Chronic respiratory failure with hypoxia: Secondary | ICD-10-CM

## 2019-04-25 MED ORDER — DOXYCYCLINE HYCLATE 100 MG PO TABS: 100.0000 mg | ORAL_TABLET | Freq: Two times a day (BID) | ORAL | 0 refills | Status: DC

## 2019-04-25 MED ORDER — IPRATROPIUM-ALBUTEROL 0.5-2.5 (3) MG/3ML IN SOLN: 3.0000 mL | Freq: Four times a day (QID) | RESPIRATORY_TRACT | 0 refills | Status: DC | PRN

## 2019-04-25 MED ORDER — PREDNISONE 10 MG PO TABS: ORAL_TABLET | ORAL | 0 refills | Status: DC

## 2019-04-25 NOTE — Telephone Encounter (Signed)
Spoke with pt. She is aware of Tammy's response. Pt has been scheduled for a televisit today at 1345. Nothing further was needed.

## 2019-04-25 NOTE — Telephone Encounter (Signed)
Will need televisit /video visit to sort out   Please contact office for sooner follow up if symptoms do not improve or worsen or seek emergency care

## 2019-04-25 NOTE — Telephone Encounter (Signed)
Primary Pulmonologist: RA Last office visit and with whom: 02/11/19 with  What do we see them for (pulmonary problems): COPD Last OV assessment/plan: Instructions  Ambulatory saturation check qualify for oxygen for Medicare  Stay on Symbicort and Spiriva-call me when you are running out of this and we can trial a sample of Trelegy  Annual low-dose CT screening next would be in September 2021     Was appointment offered to patient (explain)?  Pt wants an appt but wants in office visit and is having acute symptoms   Reason for call: Called and spoke with pt who stated about two weeks ago, she began to have increased SOB, cough with white mucus, chest tightness, wheezing.  Pt is still taking the symbicort and spiriva as prescribed.  Pt has had to use her nebulizer at least four times daily to help with her symptoms.  Pt denies any complaints of fever and stated her temp today was 97.6  Pt denies any complaints of body aches or chills, no loss of taste or loss of smell. Pt has not had to be tested for covid and has not been around anyone that has been sick or exposed to covid.  Pt is wanting to come in to the office for an appt. Tammy, please advise if you are okay with pt coming in to the office or if we need to schedule her a virtual visit based on her symptoms.

## 2019-04-25 NOTE — Progress Notes (Signed)
Virtual Visit via Telephone Note  I connected with Kim [name] on 04/25/19 at  3:00 PM EST by telephone and verified that I am speaking with the correct person using two identifiers.  Location: Patient: Home  Provider: Office    I discussed the limitations, risks, security and privacy concerns of performing an evaluation and management service by telephone and the availability of in person appointments. I also discussed with the patient that there may be a patient responsible charge related to this service. The patient expressed understanding and agreed to proceed.   History of Present Illness: 64 yo former smoker followed for severe COPD  Medical history [facility]ubo Cardiomyopathy   Today's televist is an acute office visit .  Patient complains over the last 2 weeks he has had increased cough congestion wheezing and shortness of breath.  Patient says she was treated for a COPD exacerbation about 4 weeks ago with a Z-Pak and prednisone symptoms did improve but started to slowly come back over the last 2 weeks.  The last 5 days she has been having increased cough congestion wheezing shortness of breath she is coughing up thick beige and clear mucus.  She denies any fever.  No loss of taste or smell.  She has no known sick contacts.  She is on Symbicort and Spiriva.  Says she has been using her DuoNeb nebulizer more frequently.  She denies any chest pain orthopnea PND leg swelling hemoptysis or calf pain.   Patient Active Problem List   Diagnosis Date Noted  . History of colonic polyps   . Benign neoplasm of ascending colon   . Neuritis 01/29/2017  . Alpha-1-antitrypsin deficiency carrier 01/19/2017  . Dependent edema 01/14/2017  . Upper airway cough syndrome 09/11/2016  . Allergic rhinitis 08/10/2015  . Chronic respiratory failure ([place]) 07/05/2015  . Hyperglycemia   . HLD (hyperlipidemia)   . Coronary artery calcification seen on CAT scan 07/02/2015  . Solitary pulmonary nodule  12/02/2013  . Loss of weight 11/10/2013  . Osteoarthritis 06/05/2009  . Brachial neuritis or radiculitis 06/05/2009  . Dyslipidemia 03/12/2009  . 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 COPD GOLD III  08/31/2007  . PARESTHESIA 08/24/2007  . Anxiety and depression 11/19/2006   Current Outpatient Medications on File Prior to Visit  Medication Sig Dispense Refill  . albuterol (VENTOLIN HFA) 108 (90 Base) MCG/ACT inhaler INHALE 2 PUFFS BY MOUTH  INTO THE LUNGS EVERY 4  HOURS AS NEEDED FOR  WHEEZING OR SHORTNESS OF  BREATH 54 g 1  . ALPRAZolam (XANAX) 0.5 MG tablet TAKE 1/2 TO 1 TABLET BY MOUTH THREE TIMES DAILY 60 tablet 2  . aspirin EC 81 MG EC tablet Take 1 tablet (81 mg total) by mouth daily.    . budesonide-formoterol (SYMBICORT) 160-4.5 MCG/ACT inhaler Inhale 2 puffs into the lungs 2 (two) times daily. 30.6 g 2  . Cholecalciferol (VITAMIN D3) 1000 units CAPS Take 1 capsule (1,000 Units total) by mouth 2 (two) times daily. 60 capsule 6  . furosemide (LASIX) 40 MG tablet Take 1 tablet (40 mg total) by mouth daily as needed (for weight gain of 3 lbs). 30 tablet 5  . HYDROcodone-acetaminophen (NORCO) 7.5-325 MG tablet Take 1 tablet by mouth every 6 (six) hours as needed for moderate pain. 45 tablet 0  . ipratropium-albuterol (DUONEB) 0.5-2.5 (3) MG/3ML SOLN Take 3 mLs by nebulization every 6 (six) hours as needed. 360 mL 3  . metoprolol tartrate (LOPRESSOR) 25 MG tablet Take 0.5 tablets (12.5 mg total)  by mouth 2 (two) times daily. 90 tablet 3  . nitroGLYCERIN (NITROSTAT) 0.4 MG SL tablet Place 1 tablet (0.4 mg total) under the tongue every 5 (five) minutes as needed for chest pain. 25 tablet 3  . PARoxetine (PAXIL CR) 25 MG 24 hr tablet Take 1 tablet (25 mg total) by mouth every morning. 90 tablet 1  . rosuvastatin (CRESTOR) 20 MG tablet Take 1 tablet (20 mg total) by mouth daily. 90 tablet 3  . Spacer/Aero Chamber Mouthpiece MISC 1 Device by Does not apply route as directed. 1 each 0  . Tiotropium  Bromide Monohydrate (SPIRIVA RESPIMAT) 2.5 MCG/ACT AERS USE 2 INHALATIONS BY MOUTH  DAILY 12 g 3  . vitamin B-12 (CYANOCOBALAMIN) 500 MCG tablet Take 500 mcg by mouth 2 (two) times daily.    Marland Kitchen HYDROcodone-acetaminophen (NORCO) 7.5-325 MG tablet Take 1 tablet by mouth every 6 (six) hours as needed for moderate pain. (Patient not taking: Reported on 04/25/2019) 45 tablet 0  . HYDROcodone-acetaminophen (NORCO) 7.5-325 MG tablet Take 1 tablet by mouth every 6 (six) hours as needed for moderate pain. (Patient not taking: Reported on 04/25/2019) 45 tablet 0   No current facility-administered medications on file prior to visit.      Observations/Objective:  11/2014: FVC 2.00 L (63%) FEV1 1.00 L (41%)ratio50 negative bronchodilator response TLC 5.06 L (103%) RV 140% DLCO uncorrected 51%  12/2018 LD CT chest - stable nodules  10/2017 CT chest-stable nodules, adrenal adenoma 10/2016 CT chest showed subcentimeter nodules which have been stable since 2017, right adrenal nodule was stable since 2015 and considered benign   Assessment and Plan: Recurrent COPD exacerbation.- We will check for COVID-19 testing Close follow-up in 1 week.  Treat with antibiotics and steroids.  On return visit needed sputum culture. Consider adding an Daliresp on return Patient advised if symptoms are not improving she is to contact immediately or seek emergency room care.  Plan  Patient Instructions  Doxycycline 100 mg twice daily for 1 week Prednisone taper Mucinex DM twice daily as needed for cough and congestion Continue on Symbicort and Spiriva DuoNeb nebulizer as needed Follow-up in 1 week with Dr. Elsworth Soho  Or Devyn Sheerin NP  and as needed Please contact office for sooner follow up if symptoms do not improve or worsen or seek emergency care        Follow Up Instructions: Follow-up in 1 week and as needed   I discussed the assessment and treatment plan with the patient. The patient was provided an opportunity  to ask questions and all were answered. The patient agreed with the plan and demonstrated an understanding of the instructions.   The patient was advised to call back or seek an in-person evaluation if the symptoms worsen or if the condition fails to improve as anticipated.  I provided 26 minutes of non-face-to-face time during this encounter.   Rexene Edison, NP

## 2019-04-25 NOTE — Patient Instructions (Signed)
Doxycycline 100 mg twice daily for 1 week Prednisone taper Mucinex DM twice daily as needed for cough and congestion Continue on Symbicort and Spiriva DuoNeb nebulizer as needed Follow-up in 1 week with Dr. Elsworth Soho  Or Slade [place] NP  and as needed Please contact office for sooner follow up if symptoms do not improve or worsen or seek emergency care

## 2019-04-25 NOTE — Addendum Note (Signed)
Addended by: Parke Poisson E on: 04/25/2019 03:43 PM   Modules accepted: Orders

## 2019-04-27 ENCOUNTER — Ambulatory Visit: Payer: Medicare Other | Attending: Internal Medicine

## 2019-04-27 DIAGNOSIS — Z20822 Contact with and (suspected) exposure to covid-19: Secondary | ICD-10-CM

## 2019-04-28 LAB — NOVEL CORONAVIRUS, NAA: SARS-CoV-2, NAA: NOT DETECTED

## 2019-05-02 ENCOUNTER — Ambulatory Visit (INDEPENDENT_AMBULATORY_CARE_PROVIDER_SITE_OTHER): Payer: Medicare Other | Admitting: Adult Health

## 2019-05-02 ENCOUNTER — Other Ambulatory Visit: Payer: Self-pay

## 2019-05-02 ENCOUNTER — Encounter: Payer: Self-pay | Admitting: Adult Health

## 2019-05-02 DIAGNOSIS — J9611 Chronic respiratory failure with hypoxia: Secondary | ICD-10-CM | POA: Diagnosis not present

## 2019-05-02 DIAGNOSIS — J449 Chronic obstructive pulmonary disease, unspecified: Secondary | ICD-10-CM | POA: Diagnosis not present

## 2019-05-02 NOTE — Assessment & Plan Note (Signed)
Continue on oxygen with activity as needed O2 saturation goals greater than 88 to 90%

## 2019-05-02 NOTE — Patient Instructions (Addendum)
Finish Prednisone taper as directed.  Mucinex DM twice daily as needed for cough and congestion Continue on Symbicort and Spiriva DuoNeb nebulizer every 6hr as needed wheezing /shortness of breath .  Oxygen 2l/m with activity as needed, oxygen sat goal is >88-90%.  Follow-up in 2-3 months with Dr. Elsworth Soho  Or Mahathi Pokorney NP  and as needed Please contact office for sooner follow up if symptoms do not improve or worsen or seek emergency care

## 2019-05-02 NOTE — Progress Notes (Signed)
@Patient  ID: Kim [name], female    DOB: 06-07-1955, 64 y.o.   MRN: [id number]  Chief Complaint  Patient presents with  . 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 (102)  Medical history significant for Takotsubo cardiomyopathy   TEST/EVENTS :  11/2014: FVC 2.00 L (63%) FEV1 1.00 L (41%)ratio50 negative bronchodilator response TLC 5.06 L (103%) RV 140% DLCO uncorrected 51%  12/2018 LD CT chest - stable nodules  10/2017 CT chest-stable nodules, adrenal adenoma 10/2016 CT chest showed subcentimeter nodules which have been stable since 2017, right adrenal nodule was stable since 2015 and considered benign08/2016: FVC 2.00 L (63%) FEV1 1.00 L (41%)ratio50 negative bronchodilator response TLC 5.06 L (103%) RV 140% DLCO uncorrected 51%  12/2018 LD CT chest - stable nodules  10/2017 CT chest-stable nodules, adrenal adenoma 10/2016 CT chest showed subcentimeter nodules which have been stable since 2017, right adrenal nodule was stable since 2015 and considered benign  05/02/2019 Follow up : COPD  Patient returns for a 1 week follow-up.  Patient had a slow to resolve COPD exacerbation.  She was treated with doxycycline for 1 week and a prednisone taper COVID-19 testing was negative.  Patient says she is feeling better.  Breathing is returning back to baseline.  She denies any chest pain orthopnea PND or increased leg swelling.  Appetite is improved.  Activity level is returning back to baseline.  Allergies  Allergen Reactions  . Ciprofloxacin     Hallucinations   . Lisinopril     cough  . Penicillins Hives    Has patient had a PCN reaction causing immediate rash, facial/tongue/throat swelling, SOB or lightheadedness with hypotension: Yes Has patient had a PCN reaction causing severe rash involving mucus membranes or skin necrosis: No Has patient had a PCN reaction that  required hospitalization No Has patient had a PCN reaction occurring within the last 10 years: No If all of the above answers are "NO", then may proceed with Cephalosporin use.     Immunization History  Administered Date(s) Administered  . Influenza Split 01/31/2011, 01/27/2012, 03/25/2014  . Influenza Whole 01/05/2008, 03/12/2009, 03/13/2010  . Influenza,inj,Quad PF,6+ Mos 12/29/2012, 12/20/2014, 01/07/2017, 12/17/2017, 12/17/2018  . Influenza-Unspecified 02/11/2016  . Pneumococcal Conjugate-13 08/10/2015  . Pneumococcal Polysaccharide-23 05/08/2006  . Tdap 01/31/2011    Past Medical History:  Diagnosis Date  . C8 RADICULOPATHY 06/05/2009  . COPD 08/31/2007  . DEPRESSION 11/19/2006  . DYSLIPIDEMIA 03/12/2009  . MI (mitral incompetence)   . 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 OSTEOARTHRITIS 06/05/2009  . PARESTHESIA 08/24/2007  . TOBACCO ABUSE 01/25/2010    Tobacco History: Social History   Tobacco Use  Smoking Status Former Smoker  . Packs/day: 2.00  . Years: 38.00  . Pack years: 76.00  . Types: Cigarettes  . Quit date: 03/07/2014  . Years since quitting: 5.1  Smokeless Tobacco Never Used   Counseling given: Not Answered   Outpatient Medications Prior to Visit  Medication Sig Dispense Refill  . albuterol (VENTOLIN HFA) 108 (90 Base) MCG/ACT inhaler INHALE 2 PUFFS BY MOUTH  INTO THE LUNGS EVERY 4  HOURS AS NEEDED FOR  WHEEZING OR SHORTNESS OF  BREATH 54 g 1  . ALPRAZolam (XANAX) 0.5 MG tablet TAKE 1/2 TO 1 TABLET BY MOUTH THREE TIMES DAILY 60 tablet 2  . aspirin EC 81 MG EC tablet Take 1 tablet (81 mg total) by  mouth daily.    . budesonide-formoterol (SYMBICORT) 160-4.5 MCG/ACT inhaler Inhale 2 puffs into the lungs 2 (two) times daily. 30.6 g 2  . Cholecalciferol (VITAMIN D3) 1000 units CAPS Take 1 capsule (1,000 Units total) by mouth 2 (two) times daily. 60 capsule 6  . furosemide (LASIX) 40 MG tablet Take 1 tablet (40 mg total) by mouth daily as needed (for weight gain of 3  lbs). 30 tablet 5  . HYDROcodone-acetaminophen (NORCO) 7.5-325 MG tablet Take 1 tablet by mouth every 6 (six) hours as needed for moderate pain. 45 tablet 0  . HYDROcodone-acetaminophen (NORCO) 7.5-325 MG tablet Take 1 tablet by mouth every 6 (six) hours as needed for moderate pain. 45 tablet 0  . HYDROcodone-acetaminophen (NORCO) 7.5-325 MG tablet Take 1 tablet by mouth every 6 (six) hours as needed for moderate pain. 45 tablet 0  . ipratropium-albuterol (DUONEB) 0.5-2.5 (3) MG/3ML SOLN Take 3 mLs by nebulization every 6 (six) hours as needed. Dx: J44.9 360 mL 0  . metoprolol tartrate (LOPRESSOR) 25 MG tablet Take 0.5 tablets (12.5 mg total) by mouth 2 (two) times daily. 90 tablet 3  . nitroGLYCERIN (NITROSTAT) 0.4 MG SL tablet Place 1 tablet (0.4 mg total) under the tongue every 5 (five) minutes as needed for chest pain. 25 tablet 3  . PARoxetine (PAXIL CR) 25 MG 24 hr tablet Take 1 tablet (25 mg total) by mouth every morning. 90 tablet 1  . predniSONE (DELTASONE) 10 MG tablet 4 tabs for 3 days, then 3 tabs for 3 days, 2 tabs for 3 days, then 1 tab for 3 days, then stop 30 tablet 0  . rosuvastatin (CRESTOR) 20 MG tablet Take 1 tablet (20 mg total) by mouth daily. 90 tablet 3  . Spacer/Aero Chamber Mouthpiece MISC 1 Device by Does not apply route as directed. 1 each 0  . Tiotropium Bromide Monohydrate (SPIRIVA RESPIMAT) 2.5 MCG/ACT AERS USE 2 INHALATIONS BY MOUTH  DAILY 12 g 3  . vitamin B-12 (CYANOCOBALAMIN) 500 MCG tablet Take 500 mcg by mouth 2 (two) times daily.    Marland Kitchen doxycycline (VIBRA-TABS) 100 MG tablet Take 1 tablet (100 mg total) by mouth 2 (two) times daily. (Patient not taking: Reported on 05/02/2019) 14 tablet 0   No facility-administered medications prior to visit.     Review of Systems:   Constitutional:   No  weight loss, night sweats,  Fevers, chills, +fatigue, or  lassitude.  HEENT:   No headaches,  Difficulty swallowing,  Tooth/dental problems, or  Sore throat,                 No sneezing, itching, ear ache, nasal congestion, post nasal drip,   CV:  No chest pain,  Orthopnea, PND, swelling in lower extremities, anasarca, dizziness, palpitations, syncope.   GI  No heartburn, indigestion, abdominal pain, nausea, vomiting, diarrhea, change in bowel habits, loss of appetite, bloody stools.   Resp:    No chest wall deformity  Skin: no rash or lesions.  GU: no dysuria, change in color of urine, no urgency or frequency.  No flank pain, no hematuria   MS:  No joint pain or swelling.  No decreased range of motion.  No back pain.    Physical Exam  BP 138/70 (BP Location: Left Arm, Cuff Size: Normal)   Pulse 72   Temp (!) 97.2 F (36.2 C) (Temporal)   Ht 5\' 3"  (1.6 m)   Wt 139 lb 3.2 oz (63.1 kg)   SpO2 93%  Comment: RA  BMI 24.66 kg/m   GEN: A/Ox3; pleasant , NAD   HEENT:  [place]/AT,  NOSE-clear, THROAT-clear, no lesions, no postnasal drip or exudate noted.   NECK:  Supple w/ fair ROM; no JVD; normal carotid impulses w/o bruits; no thyromegaly or nodules palpated; no lymphadenopathy.    RESP  Clear  P & A; w/o, wheezes/ rales/ or rhonchi. no accessory muscle use, no dullness to percussion  CARD:  RRR, no m/r/g, no peripheral edema, pulses intact, no cyanosis or clubbing.  GI:   Soft & nt; nml bowel sounds; no organomegaly or masses detected.   Musco: Warm bil, no deformities or joint swelling noted.   Neuro: alert, no focal deficits noted.    Skin: Warm, no lesions or rashes    Lab Results:  CBC   BNP   Imaging: No results found.    PFT Results Latest Ref Rng & Units 11/16/2014  FVC-Pre L 2.00  FVC-Predicted Pre % 63  FVC-Post L 2.21  FVC-Predicted Post % 70  Pre FEV1/FVC % % 50  Post FEV1/FCV % % 48  FEV1-Pre L 1.00  FEV1-Predicted Pre % 41  FEV1-Post L 1.05  DLCO UNC% % 51  DLCO COR %Predicted % 57  TLC L 5.06  TLC % Predicted % 103  RV % Predicted % 140    No results found for: NITRICOXIDE      Assessment & Plan:    COPD GOLD III  Recent exacerbation-now improved  Plan Patient Instructions   Finish Prednisone taper as directed.  Mucinex DM twice daily as needed for cough and congestion Continue on Symbicort and Spiriva DuoNeb nebulizer every 6hr as needed wheezing /shortness of breath .  Oxygen 2l/m with activity as needed, oxygen sat goal is >88-90%.  Follow-up in 2-3 months with Dr. Elsworth Soho  Or Othel Hoogendoorn NP  and as needed Please contact office for sooner follow up if symptoms do not improve or worsen or seek emergency care       Chronic respiratory failure ([place]) Continue on oxygen with activity as needed O2 saturation goals greater than 88 to 90%     Rexene Edison, NP 05/02/2019

## 2019-05-02 NOTE — Assessment & Plan Note (Signed)
Recent exacerbation-now improved  Plan Patient Instructions   Finish Prednisone taper as directed.  Mucinex DM twice daily as needed for cough and congestion Continue on Symbicort and Spiriva DuoNeb nebulizer every 6hr as needed wheezing /shortness of breath .  Oxygen 2l/m with activity as needed, oxygen sat goal is >88-90%.  Follow-up in 2-3 months with Dr. Elsworth Soho  Or Britne Borelli NP  and as needed Please contact office for sooner follow up if symptoms do not improve or worsen or seek emergency care

## 2019-05-13 ENCOUNTER — Encounter: Payer: Self-pay | Admitting: Primary Care

## 2019-05-13 ENCOUNTER — Ambulatory Visit (INDEPENDENT_AMBULATORY_CARE_PROVIDER_SITE_OTHER): Payer: Medicare Other | Admitting: Primary Care

## 2019-05-13 ENCOUNTER — Other Ambulatory Visit: Payer: Self-pay

## 2019-05-13 DIAGNOSIS — J441 Chronic obstructive pulmonary disease with (acute) exacerbation: Secondary | ICD-10-CM | POA: Diagnosis not present

## 2019-05-13 MED ORDER — PREDNISONE 10 MG PO TABS: ORAL_TABLET | ORAL | 0 refills | Status: DC

## 2019-05-13 MED ORDER — AZITHROMYCIN 250 MG PO TABS: ORAL_TABLET | ORAL | 0 refills | Status: DC

## 2019-05-13 NOTE — Progress Notes (Signed)
Virtual Visit via Telephone Note  I connected with Kim [name] on 05/13/19 at  2:30 PM EST by telephone and verified that I am speaking with the correct person using two identifiers.  Location: Patient: Home Provider: Office   I discussed the limitations, risks, security and privacy concerns of performing an evaluation and management service by telephone and the availability of in person appointments. I also discussed with the patient that there may be a patient responsible charge related to this service. The patient expressed understanding and agreed to proceed.   History of Present Illness: 64 year old female, former smoker quit 2015 (76 pack year hx). PMH significant for COPD GOLD III, chronic respiratory failure, allergic rhinitis, coronary artery calcification, HLD, benign neoplasm ascending colon. Patient of Dr. Elsworth Soho, last seen by pulmonary NP on 05/02/19 for COPD exacerbation. Maintained on Symbicort 160 and Spiriva.    05/13/2019 Patient contacted today for acute televisit. Reports wheezing and congested cough x 2 days. States that she did initially improve after completing doxycycline and prednisone taper. Her cough is thick with beige/white mucus. She has used her Duoneb twice today with minimal improvement in wheezing.  States that she does better with azithromycin antibiotic. Denies sick contact. COVID negative on 04/27/19. Declining ED eval.  COPD exacerbations: June 2020- COPD exacerbation/Prednisone + zpack September- prednisone PCP December 2020- Prednisone + zpack ? PCP January 18th 2021- COPD exacerbation/Prednisone + doxycycline  February 5th 2021- COPD exacerbation/Prednisone + Zpack   Observations/Objective:  - Audible coarse wheezing, moderate shortness of breath with speaking  - Patient reports O2- 92% on RA  Assessment and Plan:  COPD exacerbation: RX zpack and prednisone taper (40mg  x 3 days; 30mg  x 3 days; 20mg  x 3 days; 10mg x 3 days) Mucinex twice  daily Continue Symbicort two puffs twice daily; Spiriva Respimat 2.81mcg once daily Continue prn albuterol hfa/duoneb q 6 hours for breakthrough shortness of breath/wheezing Consider adding daliresp at next visit or staying on low dose prednisone   Follow Up Instructions: - FU in 7-10 days; ED if wheezing/sob acutely worsen    I discussed the assessment and treatment plan with the patient. The patient was provided an opportunity to ask questions and all were answered. The patient agreed with the plan and demonstrated an understanding of the instructions.   The patient was advised to call back or seek an in-person evaluation if the symptoms worsen or if the condition fails to improve as anticipated.  I provided 22 minutes of non-face-to-face time during this encounter.   Martyn Ehrich, NP

## 2019-05-16 NOTE — Patient Instructions (Signed)
COPD exacerbation: RX zpack and prednisone taper (40mg  x 3 days; 30mg  x 3 days; 20mg  x 3 days; 10mg x 3 days) Mucinex twice daily Continue Symbicort two puffs twice daily; Spiriva Respimat 2.2mcg once daily Continue prn albuterol hfa/duoneb q 6 hours for breakthrough shortness of breath/wheezing  Follow Up Instructions: - FU in 7-10 days; ED if wheezing/sob acutely worsen

## 2019-05-17 ENCOUNTER — Ambulatory Visit (INDEPENDENT_AMBULATORY_CARE_PROVIDER_SITE_OTHER): Payer: Medicare Other | Admitting: Primary Care

## 2019-05-17 ENCOUNTER — Encounter: Payer: Self-pay | Admitting: Primary Care

## 2019-05-17 ENCOUNTER — Other Ambulatory Visit: Payer: Self-pay

## 2019-05-17 DIAGNOSIS — J441 Chronic obstructive pulmonary disease with (acute) exacerbation: Secondary | ICD-10-CM | POA: Diagnosis not present

## 2019-05-17 MED ORDER — PREDNISONE 10 MG PO TABS: ORAL_TABLET | ORAL | 0 refills | Status: DC

## 2019-05-17 NOTE — Progress Notes (Signed)
Virtual Visit via Telephone Note  I connected with Kim [name] on 05/17/19 at 11:00 AM EST by telephone and verified that I am speaking with the correct person using two identifiers.  Location: Patient: Home Provider: Office   I discussed the limitations, risks, security and privacy concerns of performing an evaluation and management service by telephone and the availability of in person appointments. I also discussed with the patient that there may be a patient responsible charge related to this service. The patient expressed understanding and agreed to proceed.   History of Present Illness: 64 year old female, former smoker quit 2015 (76 pack year hx). PMH significant for COPD GOLD III, chronic respiratory failure, allergic rhinitis, coronary artery calcification, HLD, benign neoplasm ascending colon. Patient of Dr. Elsworth Soho. Maintained on Symbicort 160 and Spiriva.    Previous [facility] pulmonary encounter: 05/13/2019 Patient contacted today for acute televisit. Reports wheezing and congested cough x 2 days. States that she did initially improve after completing doxycycline and prednisone taper. Her cough is thick with beige/white mucus. She has used her Duoneb twice today with minimal improvement in wheezing.  States that she does better with azithromycin antibiotic. Denies sick contact. COVID negative on 04/27/19. Declining ED eval.  COPD exacerbations: June 2020- COPD exacerbation/Prednisone + zpack September- prednisone PCP December 2020- Prednisone + zpack ? PCP January 18th 2021- COPD exacerbation/Prednisone + doxycycline  February 5th 2021- COPD exacerbation/Prednisone + Zpack  February 9th 2021- Prolonged COPD exacerbation requiring high dose prednisone/ CXR ordered   05/17/2019 Patient contacted today for 4 day follow-up COPD exacerbation. Shortness of breath, wheezing and coughing symptoms have been persistent off/on for three months. She does feel 25% better since telephone visit on  Friday. She completed Zpack today and has three days of prednisone taper left. Continues to have moderate amount of wheezing and productive cough with extremely thick clear/beige mucus. She is taking 1,200mg  mucinex twice and using duoneb 3-4 times a day followed by her flutter valve. She has panic attacks with coughing fits. Takes xanax as needed. LDCT in September centrilobular and paraseptal emphysema. Reports that she does better with higher dose prednisone taper from Dr. Ashok Cordia. No history of diabetes. She was Covid negative two weeks. She lives with her husband, she has not left her house. Denies fever, chills, sweats, HA, N/V/D.    Observations/Objective:  - Moderate wheezing and cough - Able to speak in 4-5 word sentences  Assessment and Plan:  COPD exacerbation - Prolonged exacerbation requiring high dose prednisone - RX prednisone taper (6 tabs x 3 days; 5tabs x 3 days; 4tabs x 3 days; 3tabs x 3 days; 2tabs x 3 days; 1tab x 3 days) - Continue Symbicort 160 2bid; Spiriva respimat qd  - Continue DS mucinex twice daily, duoneb 3-4 times a day and flutter valve  - Needs CXR tomorrow - Low suspicion for covid, negative two weeks ago  Follow Up Instructions:  1 week follow-up    I discussed the assessment and treatment plan with the patient. The patient was provided an opportunity to ask questions and all were answered. The patient agreed with the plan and demonstrated an understanding of the instructions.   The patient was advised to call back or seek an in-person evaluation if the symptoms worsen or if the condition fails to improve as anticipated.  I provided 22 minutes of non-face-to-face time during this encounter.   Martyn Ehrich, NP

## 2019-05-17 NOTE — Patient Instructions (Signed)
  Prolonged exacerbation requiring high dose prednisone - RX prednisone taper (6 tabs x 3 days; 5tabs x 3 days; 4tabs x 3 days; 3tabs x 3 days; 2tabs x 3 days; 1tab x 3 days) - Continue Symbicort 160 2bid; Spiriva respimat qd  - Continue DS mucinex twice daily, duoneb 3-4 times a day and flutter valve  - Needs CXR tomorrow - Low suspicion for covid, negative two weeks ago  Follow Up Instructions:  1 week follow-up

## 2019-05-19 ENCOUNTER — Ambulatory Visit (INDEPENDENT_AMBULATORY_CARE_PROVIDER_SITE_OTHER): Payer: Medicare Other

## 2019-05-19 DIAGNOSIS — J441 Chronic obstructive pulmonary disease with (acute) exacerbation: Secondary | ICD-10-CM | POA: Diagnosis not present

## 2019-05-19 NOTE — Progress Notes (Signed)
Please let patient know CXR showed no acute findings. Chronic copd and bronchitic changes.

## 2019-05-31 ENCOUNTER — Other Ambulatory Visit: Payer: Self-pay

## 2019-05-31 DIAGNOSIS — J9611 Chronic respiratory failure with hypoxia: Secondary | ICD-10-CM

## 2019-05-31 DIAGNOSIS — J449 Chronic obstructive pulmonary disease, unspecified: Secondary | ICD-10-CM

## 2019-05-31 MED ORDER — IPRATROPIUM-ALBUTEROL 0.5-2.5 (3) MG/3ML IN SOLN: 3.0000 mL | Freq: Four times a day (QID) | RESPIRATORY_TRACT | 1 refills | Status: DC | PRN

## 2019-06-02 ENCOUNTER — Ambulatory Visit: Payer: Medicare Other | Admitting: Pulmonary Disease

## 2019-06-03 ENCOUNTER — Ambulatory Visit (INDEPENDENT_AMBULATORY_CARE_PROVIDER_SITE_OTHER): Payer: Medicare Other | Admitting: Primary Care

## 2019-06-03 ENCOUNTER — Other Ambulatory Visit: Payer: Self-pay

## 2019-06-03 ENCOUNTER — Encounter: Payer: Self-pay | Admitting: Primary Care

## 2019-06-03 DIAGNOSIS — J449 Chronic obstructive pulmonary disease, unspecified: Secondary | ICD-10-CM

## 2019-06-03 DIAGNOSIS — J9611 Chronic respiratory failure with hypoxia: Secondary | ICD-10-CM

## 2019-06-03 MED ORDER — ALBUTEROL SULFATE HFA 108 (90 BASE) MCG/ACT IN AERS: INHALATION_SPRAY | RESPIRATORY_TRACT | 1 refills | Status: DC

## 2019-06-03 MED ORDER — TRELEGY ELLIPTA 200-62.5-25 MCG/INH IN AEPB: 1.0000 | INHALATION_SPRAY | Freq: Every day | RESPIRATORY_TRACT | 0 refills | Status: DC

## 2019-06-03 NOTE — Progress Notes (Signed)
@Patient  ID: Kim [name], female    DOB: 1955-07-03, 64 y.o.   MRN: [id number]  Chief Complaint  Patient presents with  . Follow-up    Patient is here for follow up for COPD. Patient is feeling better since last visit after she finished the prednisone taper. Patient has shortness of breath still with exertion and is wheezing. Patient has oxygen but only wears it as needed.    Referring provider: [name], NP  HPI: 64 year old female, former smoker quit 2015 (76 pack year hx). PMH significant for COPD GOLD III, chronic respiratory failure, allergic rhinitis, coronary artery calcification, HLD, benign neoplasm ascending colon. Patient of Dr. Elsworth Soho. Maintained on Symbicort 160 and Spiriva.    Previous [facility] pulmonary encounter: 05/13/2019 Patient contacted today for acute televisit. Reports wheezing and congested cough x 2 days. States that she did initially improve after completing doxycycline and prednisone taper. Her cough is thick with beige/white mucus. She has used her Duoneb twice today with minimal improvement in wheezing.  States that she does better with azithromycin antibiotic. Denies sick contact. COVID negative on 04/27/19. Declining ED eval.  COPD exacerbations: June 2020- COPD exacerbation/Prednisone + zpack September- prednisone PCP December 2020- Prednisone + zpack ? PCP January 18th 2021- COPD exacerbation/Prednisone + doxycycline  February 5th 2021- COPD exacerbation/Prednisone + Zpack  February 9th 2021- Prolonged COPD exacerbation requiring high dose prednisone/ CXR ordered   05/17/2019 Patient contacted today for 4 day follow-up COPD exacerbation. Shortness of breath, wheezing and coughing symptoms have been persistent off/on for three months. She does feel 25% better since telephone visit on Friday. She completed Zpack today and has three days of prednisone taper left. Continues to have moderate amount of wheezing and productive cough with extremely thick  clear/beige mucus. She is taking 1,200mg  mucinex twice and using duoneb 3-4 times a day followed by her flutter valve. She has panic attacks with coughing fits. Takes xanax as needed. LDCT in September centrilobular and paraseptal emphysema. Reports that she does better with higher dose prednisone taper from Dr. Ashok Cordia. No history of diabetes. She was Covid negative two weeks. She lives with her husband, she has not left her house. Denies fever, chills, sweats, HA, N/V/D.   06/03/2019 Patient presents today for 2 week follow-up. She is feeling much better. Completed extended prednisone taper. She still has some wheezing which is baseline for her. She did not take her inhalers this morning but reports compliance with them. Needs refill of Albuterol, uses her rescue inhaler 3-4 times a day. Chronic cough. Some rare relux related to diet. Uses her oxygen only as needed, brought it with her today but she is not wearing it. O2 93% on RA. 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     Allergies  Allergen Reactions  . Ciprofloxacin     Hallucinations   . Lisinopril     cough  . Penicillins Hives    Has patient had a PCN reaction causing immediate rash, facial/tongue/throat swelling, SOB or lightheadedness with hypotension: Yes Has patient had a PCN reaction causing severe rash involving mucus membranes or skin necrosis: No Has patient had a PCN reaction that required hospitalization No Has patient had a PCN reaction occurring within the last 10 years: No If all of the above answers are "NO", then may proceed with Cephalosporin use.     Immunization History  Administered Date(s) Administered  . Influenza Split 01/31/2011, 01/27/2012, 03/25/2014  . Influenza Whole  01/05/2008, 03/12/2009, 03/13/2010  . Influenza,inj,Quad PF,6+ Mos 12/29/2012, 12/20/2014, 01/07/2017, 12/17/2017, 12/17/2018  . Influenza-Unspecified 02/11/2016  . Pneumococcal Conjugate-13 08/10/2015  . Pneumococcal  Polysaccharide-23 05/08/2006  . Tdap 01/31/2011    Past Medical History:  Diagnosis Date  . C8 RADICULOPATHY 06/05/2009  . COPD 08/31/2007  . DEPRESSION 11/19/2006  . DYSLIPIDEMIA 03/12/2009  . MI (mitral incompetence)   . 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 OSTEOARTHRITIS 06/05/2009  . PARESTHESIA 08/24/2007  . TOBACCO ABUSE 01/25/2010    Tobacco History: Social History   Tobacco Use  Smoking Status Former Smoker  . Packs/day: 2.00  . Years: 38.00  . Pack years: 76.00  . Types: Cigarettes  . Quit date: 03/07/2014  . Years since quitting: 5.2  Smokeless Tobacco Never Used   Counseling given: Not Answered   Outpatient Medications Prior to Visit  Medication Sig Dispense Refill  . ALPRAZolam (XANAX) 0.5 MG tablet TAKE 1/2 TO 1 TABLET BY MOUTH THREE TIMES DAILY 60 tablet 2  . aspirin EC 81 MG EC tablet Take 1 tablet (81 mg total) by mouth daily.    . budesonide-formoterol (SYMBICORT) 160-4.5 MCG/ACT inhaler Inhale 2 puffs into the lungs 2 (two) times daily. 30.6 g 2  . Cholecalciferol (VITAMIN D3) 1000 units CAPS Take 1 capsule (1,000 Units total) by mouth 2 (two) times daily. 60 capsule 6  . furosemide (LASIX) 40 MG tablet Take 1 tablet (40 mg total) by mouth daily as needed (for weight gain of 3 lbs). 30 tablet 5  . HYDROcodone-acetaminophen (NORCO) 7.5-325 MG tablet Take 1 tablet by mouth every 6 (six) hours as needed for moderate pain. 45 tablet 0  . HYDROcodone-acetaminophen (NORCO) 7.5-325 MG tablet Take 1 tablet by mouth every 6 (six) hours as needed for moderate pain. 45 tablet 0  . HYDROcodone-acetaminophen (NORCO) 7.5-325 MG tablet Take 1 tablet by mouth every 6 (six) hours as needed for moderate pain. 45 tablet 0  . ipratropium-albuterol (DUONEB) 0.5-2.5 (3) MG/3ML SOLN Take 3 mLs by nebulization every 6 (six) hours as needed. Dx: J44.9 360 mL 1  . metoprolol tartrate (LOPRESSOR) 25 MG tablet Take 0.5 tablets (12.5 mg total) by mouth 2 (two) times daily. 90 tablet 3  .  nitroGLYCERIN (NITROSTAT) 0.4 MG SL tablet Place 1 tablet (0.4 mg total) under the tongue every 5 (five) minutes as needed for chest pain. 25 tablet 3  . PARoxetine (PAXIL CR) 25 MG 24 hr tablet Take 1 tablet (25 mg total) by mouth every morning. 90 tablet 1  . rosuvastatin (CRESTOR) 20 MG tablet Take 1 tablet (20 mg total) by mouth daily. 90 tablet 3  . Spacer/Aero Chamber Mouthpiece MISC 1 Device by Does not apply route as directed. 1 each 0  . Tiotropium Bromide Monohydrate (SPIRIVA RESPIMAT) 2.5 MCG/ACT AERS USE 2 INHALATIONS BY MOUTH  DAILY 12 g 3  . vitamin B-12 (CYANOCOBALAMIN) 500 MCG tablet Take 500 mcg by mouth 2 (two) times daily.    Marland Kitchen albuterol (VENTOLIN HFA) 108 (90 Base) MCG/ACT inhaler INHALE 2 PUFFS BY MOUTH  INTO THE LUNGS EVERY 4  HOURS AS NEEDED FOR  WHEEZING OR SHORTNESS OF  BREATH 54 g 1  . azithromycin (ZITHROMAX) 250 MG tablet Zpack taper as directed 6 tablet 0  . predniSONE (DELTASONE) 10 MG tablet Take 4 tabs po daily x 3 days; then 3 tabs daily x3 days; then 2 tabs daily x3 days; then 1 tab daily x 3 days; then stop 30 tablet 0  . predniSONE (DELTASONE) 10 MG  tablet 6 tabs x 3 days; 5 tabs x 3 days; 4 tabs x 3 days; 3 tabs x 3 days; 2 tabs x 3 days; 1 tab x 3 days 63 tablet 0   No facility-administered medications prior to visit.    Review of Systems  Review of Systems  Constitutional: Negative.   Respiratory: Positive for cough and wheezing. Negative for shortness of breath.     Physical Exam  BP 122/68 (BP Location: Left Arm, Patient Position: Sitting, Cuff Size: Normal)   Pulse 80   Temp (!) 97.2 F (36.2 C) (Temporal)   Ht 5\' 3"  (1.6 m)   Wt 136 lb (61.7 kg)   SpO2 93%   BMI 24.09 kg/m  Physical Exam Constitutional:      General: She is not in acute distress.    Appearance: Normal appearance. She is not ill-appearing.  HENT:     Head: Normocephalic and atraumatic.  Cardiovascular:     Rate and Rhythm: Normal rate and regular rhythm.  Pulmonary:      Effort: Pulmonary effort is normal. No respiratory distress.     Breath sounds: No stridor. Wheezing present. No rhonchi or rales.  Skin:    General: Skin is warm and dry.  Neurological:     General: No focal deficit present.     Mental Status: She is alert and oriented to person, place, and time. Mental status is at baseline.  Psychiatric:        Mood and Affect: Mood normal.        Behavior: Behavior normal.        Thought Content: Thought content normal.        Judgment: Judgment normal.      Lab Results:  CBC    Component Value Date/Time   WBC 9.6 12/17/2018 0906   RBC 4.35 12/17/2018 0906   HGB 13.7 12/17/2018 0906   HGB 14.0 08/26/2016 1058   HCT 40.7 12/17/2018 0906   HCT 42.6 08/26/2016 1058   PLT 271.0 12/17/2018 0906   PLT 297 08/26/2016 1058   MCV 93.5 12/17/2018 0906   MCV 92 08/26/2016 1058   MCH 30.4 01/18/2017 0546   MCHC 33.6 12/17/2018 0906   RDW 14.1 12/17/2018 0906   RDW 14.1 08/26/2016 1058   LYMPHSABS 4.0 12/17/2018 0906   MONOABS 0.9 12/17/2018 0906   EOSABS 0.2 12/17/2018 0906   BASOSABS 0.1 12/17/2018 0906    BMET    Component Value Date/Time   NA 146 (H) 12/17/2018 0906   NA 142 08/26/2016 1058   K 4.4 12/17/2018 0906   CL 109 12/17/2018 0906   CO2 30 12/17/2018 0906   GLUCOSE 101 (H) 12/17/2018 0906   BUN 18 12/17/2018 0906   BUN 20 08/26/2016 1058   CREATININE 0.89 12/17/2018 0906   CREATININE 1.16 (H) 07/19/2015 1447   CALCIUM 9.9 12/17/2018 0906   GFRNONAA >60 01/18/2017 0546   GFRAA >60 01/18/2017 0546    BNP    Component Value Date/Time   BNP 330.4 (H) 07/07/2015 0953    ProBNP    Component Value Date/Time   PROBNP 43 08/26/2016 1058    Imaging: DG Chest 2 View  Result Date: 05/19/2019 CLINICAL DATA:  COPD exacerbation.  Former smoker. EXAM: CHEST - 2 VIEW COMPARISON:  Chest radiograph 06/04/2017 FINDINGS: Stable cardiomediastinal contours with normal heart size. The lungs are hyperinflated. There are chronic  coarse bilateral interstitial markings. No new focal consolidation. No pneumothorax or pleural effusion. No acute finding  in the visualized skeleton. IMPRESSION: No acute finding.  COPD and chronic bronchitic change. Electronically Signed   By: Audie Pinto M.D.   On: 05/19/2019 10:29     Assessment & Plan:   COPD GOLD III  - Frequent exacerbations requiring high dose steroid taper - Trial Trelegy 200- take one puff once daily  - Refilling Albuterol hfa 2 puffs every 4-6 hours as needed for sob/wheezing - Encouraged patient to stay as active as possible  Chronic respiratory failure ([place]) - Continue oxygen with activity as needed to keep O2 > 88-90%   Martyn Ehrich, NP 06/03/2019

## 2019-06-03 NOTE — Patient Instructions (Addendum)
COPD: Stop Symbicort and Spiriva  Start Trelegy 200- take one puff once daily (rinse mouth after) Refilling Albuterol  Stay as active as possible Continue to wear oxygen as needed to keep O2 > 88-90%  Follow-up: 3 months with Dr. Elsworth Soho or [facility] NP  Call if your symptoms worsen or develop acute exacerbation   COVID-19 Vaccine Information can be found at: ShippingScam.co.uk For questions related to vaccine distribution or appointments, please email vaccine@ .com or call (307)277-6058.     COPD and Physical Activity Chronic obstructive pulmonary disease (COPD) is a long-term (chronic) condition that affects the lungs. COPD is a general term that can be used to describe many different lung problems that cause lung swelling (inflammation) and limit airflow, including chronic bronchitis and emphysema. The main symptom of COPD is shortness of breath, which makes it harder to do even simple tasks. This can also make it harder to exercise and be active. Talk with your health care provider about treatments to help you breathe better and actions you can take to prevent breathing problems during physical activity. What are the benefits of exercising with COPD? Exercising regularly is an important part of a healthy lifestyle. You can still exercise and do physical activities even though you have COPD. Exercise and physical activity improve your shortness of breath by increasing blood flow (circulation). This causes your heart to pump more oxygen through your body. Moderate exercise can improve your:  Oxygen use.  Energy level.  Shortness of breath.  Strength in your breathing muscles.  Heart health.  Sleep.  Self-esteem and feelings of self-worth.  Depression, stress, and anxiety levels. Exercise can benefit everyone with COPD. The severity of your disease may affect how hard you can exercise, especially at first, but everyone  can benefit. Talk with your health care provider about how much exercise is safe for you, and which activities and exercises are safe for you. What actions can I take to prevent breathing problems during physical activity?  Sign up for a pulmonary rehabilitation program. This type of program may include: ? Education about lung diseases. ? Exercise classes that teach you how to exercise and be more active while improving your breathing. This usually involves:  Exercise using your lower extremities, such as a stationary bicycle.  About 30 minutes of exercise, 2 to 5 times per week, for 6 to 12 weeks  Strength training, such as push ups or leg lifts. ? Nutrition education. ? Group classes in which you can talk with others who also have COPD and learn ways to manage stress.  If you use an oxygen tank, you should use it while you exercise. Work with your health care provider to adjust your oxygen for your physical activity. Your resting flow rate is different from your flow rate during physical activity.  While you are exercising: ? Take slow breaths. ? Pace yourself and do not try to go too fast. ? Purse your lips while breathing out. Pursing your lips is similar to a kissing or whistling position. ? If doing exercise that uses a quick burst of effort, such as weight lifting:  Breathe in before starting the exercise.  Breathe out during the hardest part of the exercise (such as raising the weights). Where to find support You can find support for exercising with COPD from:  Your health care provider.  A pulmonary rehabilitation program.  Your local health department or community health programs.  Support groups, online or in-person. Your health care provider may be able  to recommend support groups. Where to find more information You can find more information about exercising with COPD from:  American Lung Association: ClassInsider.se.  COPD Foundation: https://www.rivera.net/. Contact a  health care provider if:  Your symptoms get worse.  You have chest pain.  You have nausea.  You have a fever.  You have trouble talking or catching your breath.  You want to start a new exercise program or a new activity. Summary  COPD is a general term that can be used to describe many different lung problems that cause lung swelling (inflammation) and limit airflow. This includes chronic bronchitis and emphysema.  Exercise and physical activity improve your shortness of breath by increasing blood flow (circulation). This causes your heart to provide more oxygen to your body.  Contact your health care provider before starting any exercise program or new activity. Ask your health care provider what exercises and activities are safe for you. This information is not intended to replace advice given to you by your health care provider. Make sure you discuss any questions you have with your health care provider. Document Revised: 07/14/2018 Document Reviewed: 04/16/2017 Elsevier Patient Education  2020 [employer].

## 2019-06-03 NOTE — Assessment & Plan Note (Signed)
-   Frequent exacerbations requiring high dose steroid taper - Trial Trelegy 200- take one puff once daily  - Refilling Albuterol hfa 2 puffs every 4-6 hours as needed for sob/wheezing - Encouraged patient to stay as active as possible

## 2019-06-03 NOTE — Assessment & Plan Note (Signed)
-   Continue oxygen with activity as needed to keep O2 > 88-90%

## 2019-06-28 ENCOUNTER — Telehealth: Payer: Self-pay | Admitting: Cardiovascular Disease

## 2019-06-28 NOTE — Telephone Encounter (Signed)
Uses pulse oximeter getting HRs high 80s-110s.   Faster HR seems to cause increased SOB and chest heaviness.  Moving around and taking deep breaths make the heaviness worse.   Has had this happen in past but not every day.  Has been daily for >3 weeks. Had a COPD exacerbation.  Was on steroids but completed. Using albuterol inhaler and nebulizer.  Discussed how this can contribute to increased HR.  She said sometimes they make her jittery. Has not contacted pulmonary. Thought symptoms would go away after she finished steroids but since they haven't she is starting to get worried that it is her heart. Has chronic cough that has improved since treating COPD exacerbation. I've scheduled an appointment for her 07/01/19 with Kathyrn Drown, NP. Pt appreciative for the assistance.

## 2019-06-28 NOTE — Telephone Encounter (Signed)
STAT if HR is under 50 or over 120 (normal HR is 60-100 beats per minute)  1) What is your heart rate? 102  2) Do you have a log of your heart rate readings (document readings)? No   3) Do you have any other symptoms? Heaviness in chest    Kim [name] is calling due to recently having an elevated HR. She checked it while on the phone and states it was 102. She did not have a list of her readings due to not witting them down, but states it jumps from normal to high. She is experiencing some heaviness in her chest, but states she has lung issues that are most likely causing that. She would like a nurse to call her to discuss this and schedule an appointment if necessary. Please advise.

## 2019-06-29 NOTE — Progress Notes (Signed)
Cardiology Office Note   Date:  07/01/2019   ID:  Kim [name], DOB 1955-12-04, MRN [id number]  PCP:  [name], NP  Cardiologist: Dr. Angelena Form, MD  Chief Complaint  Patient presents with  . Follow-up  . Shortness of Breath    History of Present Illness: Kim [name] is a 64 y.o. female who presents for follow-up, seen for Dr. Angelena Form.  [name] has a history of NICM, COPD, HLD and tobacco abuse being evaluated for her annual cardiac assessment. She is followed by Dr. Angelena Form. He first saw her as a new patient in April 2016. She was hospitalized in [place] December 2015 with COPD exacerbation. Troponin was mildly elevated at 2. Cardiac catheterization in 2015showed no obstructive CAD. She was felt to have a COPD exacerbation and she improved with treatment of her COPD. She quit smoking in September 2015. Admitted to [facility] March 2017 with respiratory distress, CHF. Cardiac cath March 2017 with minimal CAD, severe LV systolic dysfunction ([id number]). 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 She was diuresed with clinical improvement. She has been seen several times in our office after discharge with dizziness and hypotension. Lisinopril and Lasix were decreased. She is followed in the pulmonary office by Dr. Elsworth Soho.Event monitor April 2017 with sinus, PACs, PVCs. EchoMay 2018 withnormal LV systolic function, mild AI.  She was most recently seen in follow-up 08/09/2018 by Jaylene Henri, [place].  At that time she was doing well from a cardiac standpoint.  She occasionally would have palpitations with a pattern consistent of PACs and PVCs as previously documented by event monitor.  She reported that the episodes were brief and fleeting lasting less than to 3 seconds at a time.  No changes were made at that time.  She then called our office 06/28/2019 with complaints of elevated heart  rate at 102 bpm with increased BP and associated chest heaviness.   Today, [name] states that for the last several weeks she has noticed that her BP and heart rate has been elevated above her baseline. She comes in with a BP log which shows heart rates in the low 100s most days. AM BPs are noted to be as high as the 180-190/90's. Shortly after taking her medicines, SBPs are improved but are still elevated above goal. Some evenings, SBP will be in the low 100s with intermittent dizziness. During times of extreme BP elevation, she will note some chest heaviness which will resolve once BP is more stabilized. There are no exertional qualities or associated symptoms. She has O2 dependent COPD and reports that she has also been on several courses of prednisone therapy over the last several months. This could be driving her vitals up as well. It has been several years since her last CV evaluation therefore we discussed reassessing LV function with echocardiogram. Given her history of nonischemic cardiomyopathy, will also trial with losartan for hypertension. If chest heaviness symptoms do not subside with better BP control, will plan for further work-up.  Past Medical History:  Diagnosis Date  . C8 RADICULOPATHY 06/05/2009  . COPD 08/31/2007  . DEPRESSION 11/19/2006  . DYSLIPIDEMIA 03/12/2009  . MI (mitral incompetence)   . 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 OSTEOARTHRITIS 06/05/2009  . PARESTHESIA 08/24/2007  . TOBACCO ABUSE 01/25/2010    Past Surgical History:  Procedure Laterality Date  . ABDOMINAL HYSTERECTOMY    . APPENDECTOMY  1969  . CARDIAC CATHETERIZATION N/A  07/05/2015   Procedure: Left Heart Cath and Coronary Angiography;  Surgeon: Leonie Man, MD;  Location: [facility];  Service: Cardiovascular;  Laterality: N/A;  . CHOLECYSTECTOMY  1989  . collapse lung  1990  . COLONOSCOPY WITH PROPOFOL N/A 05/14/2018   Procedure: COLONOSCOPY WITH PROPOFOL;  Surgeon: Irene Shipper, MD;  Location: [facility]  ENDOSCOPY;  Service: Endoscopy;  Laterality: N/A;  . EXCISION MASS ABDOMINAL  2019  . EXPLORATORY LAPAROTOMY  1969  . POLYPECTOMY  05/14/2018   Procedure: POLYPECTOMY;  Surgeon: Irene Shipper, MD;  Location: [facility];  Service: Endoscopy;;  . SALPINGOOPHORECTOMY  2019     Current Outpatient Medications  Medication Sig Dispense Refill  . albuterol (VENTOLIN HFA) 108 (90 Base) MCG/ACT inhaler Take two puffs every 4-6 hours as needed for shortness of breath/wheezing 54 g 1  . ALPRAZolam (XANAX) 0.5 MG tablet TAKE 1/2 TO 1 TABLET BY MOUTH THREE TIMES DAILY 60 tablet 2  . aspirin EC 81 MG EC tablet Take 1 tablet (81 mg total) by mouth daily.    . budesonide-formoterol (SYMBICORT) 160-4.5 MCG/ACT inhaler Inhale 2 puffs into the lungs 2 (two) times daily. 30.6 g 2  . Cholecalciferol (VITAMIN D3) 1000 units CAPS Take 1 capsule (1,000 Units total) by mouth 2 (two) times daily. 60 capsule 6  . furosemide (LASIX) 40 MG tablet Take 1 tablet (40 mg total) by mouth daily as needed (for weight gain of 3 lbs). 30 tablet 5  . HYDROcodone-acetaminophen (NORCO) 7.5-325 MG tablet Take 1 tablet by mouth every 6 (six) hours as needed for moderate pain. 45 tablet 0  . ipratropium-albuterol (DUONEB) 0.5-2.5 (3) MG/3ML SOLN Take 3 mLs by nebulization every 6 (six) hours as needed. Dx: J44.9 360 mL 1  . metoprolol tartrate (LOPRESSOR) 25 MG tablet Take 0.5 tablets (12.5 mg total) by mouth 2 (two) times daily. 90 tablet 3  . nitroGLYCERIN (NITROSTAT) 0.4 MG SL tablet Place 1 tablet (0.4 mg total) under the tongue every 5 (five) minutes as needed for chest pain. 25 tablet 3  . PARoxetine (PAXIL CR) 25 MG 24 hr tablet Take 1 tablet (25 mg total) by mouth every morning. 90 tablet 1  . Spacer/Aero Chamber Mouthpiece MISC 1 Device by Does not apply route as directed. 1 each 0  . Tiotropium Bromide Monohydrate (SPIRIVA RESPIMAT) 2.5 MCG/ACT AERS USE 2 INHALATIONS BY MOUTH  DAILY 12 g 3  . vitamin B-12 (CYANOCOBALAMIN) 500  MCG tablet Take 500 mcg by mouth 2 (two) times daily.     No current facility-administered medications for this visit.    Allergies:   Ciprofloxacin, Lisinopril, and Penicillins    Social History:  The patient  reports that she quit smoking about 5 years ago. Her smoking use included cigarettes. She has a 76.00 pack-year smoking history. She has never used smokeless tobacco. She reports that she does not drink alcohol or use drugs.   Family History:  The patient's family history includes Alcohol abuse in an other family member; Arthritis in an other family member; Cancer in her mother and another family member; Diabetes in her sister and another family member; Heart attack in her maternal uncle, mother, and other family members; Heart disease in an other family member; Hypertension in her brother and sister; Kidney disease in an other family member; Melanoma in an other family member; Multiple sclerosis in her brother and another family member; Rheum arthritis in her sister; Stroke in her maternal grandmother and another family  member; Thyroid disease in her sister.    ROS:  Please see the history of present illness. Otherwise, review of systems are positive for none.   All other systems are reviewed and negative.    PHYSICAL EXAM: VS:  BP (!) 142/74   Pulse 74   Ht 5\' 3"  (1.6 m)   Wt 133 lb 6.4 oz (60.5 kg)   SpO2 97%   BMI 23.63 kg/m  , BMI Body mass index is 23.63 kg/m.   General: Well developed, well nourished, NAD Neck: Negative for carotid bruits. No JVD Lungs:Clear to ausculation bilaterally. No wheezes, rales, or rhonchi. Breathing is unlabored. Cardiovascular: RRR with S1 S2. + murmurs Extremities: No edema.  Radial pulses 2+ bilaterally Neuro: Alert and oriented. No focal deficits. No facial asymmetry. MAE spontaneously. Psych: Responds to questions appropriately with normal affect.    EKG:  EKG is ordered today. The ekg ordered today demonstrates NSR with HR 67 bpm and  no acute ischemic changes, similar to prior tracing.  Recent Labs: 12/17/2018: ALT 10; BUN 18; Creatinine, Ser 0.89; Hemoglobin 13.7; Platelets 271.0; Potassium 4.4; Sodium 146; TSH 1.76    Lipid Panel    Component Value Date/Time   CHOL 131 12/17/2018 0906   CHOL 221 (H) 01/25/2018 0811   TRIG 104.0 12/17/2018 0906   HDL 44.40 12/17/2018 0906   HDL 41 01/25/2018 0811   CHOLHDL 3 12/17/2018 0906   VLDL 20.8 12/17/2018 0906   LDLCALC 66 12/17/2018 0906   LDLCALC 151 (H) 01/25/2018 0811   LDLDIRECT 155.4 12/29/2012 1209    Wt Readings from Last 3 Encounters:  07/01/19 133 lb 6.4 oz (60.5 kg)  06/03/19 136 lb (61.7 kg)  05/02/19 139 lb 3.2 oz (63.1 kg)    Other studies Reviewed: Additional studies/ records that were reviewed today include:   [facility] 2017 Procedures   Left Heart Cath and Coronary Angiography  Conclusion   1. Angiographically minimal coronary disease 2. There is severe left ventricular systolic dysfunction. 3. 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  Recommendations:  Return the CCU/TCU for post radial cath care and ongoing treatment of nonischemic cardiomyopathy  Continue aggressive risk modification and diuresis.  Treat underlying cause for stress induced cardiomyopathy    2D Echo 06/2015 Impressions:  - There is akinesis of all of the apical segments, hypokinesis of the mid segments and normal motion of the basal segments consistent with stress induced cardiomyopathy (Tako-tsubo). LVEF is severely decreased estimated at 25-30%.  2D Echo 08/2016 Study Conclusions  - Left ventricle: The cavity size was normal. Systolic function was normal. The estimated ejection fraction was in the range of 60% to 65%. Wall motion was normal; there were no regional wall motion abnormalities. - Aortic valve: There was mild regurgitation. - Atrial septum: No  defect or patent foramen ovale was identified.   ASSESSMENT AND PLAN:  1.  Hypertension with chest heaviness: -BP and heart rate has been elevated above her baseline. She comes in with a BP log which shows heart rates in the low 100s most days. AM BPs are noted to be as high as the 180-190/90's. Shortly after taking her medicines, SBPs are improved but are still elevated above goal. Some evenings, SBP will be in the low 100s with intermittent dizziness. During times of extreme BP elevation, she will note some chest heaviness which will resolve once BP is more stabilized. There are no exertional qualities or associated  symptoms.   -We will trial losartan 25 mg p.o. daily with close follow-up -Likely chest heaviness is in the setting of markedly elevated BPs.  If symptoms continue despite better BP control, would consider further work-up -Plan for echocardiogram today   2. 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  -We will start losartan 25 mg p.o. daily for BP control -Lab work today  -Repeat echocardiogram  3. CAD:  -Minimal CAD noted on cath in 2017  -Is having some mild chest heaviness which occurs with BP elevation.  Likely not in the setting of progressive CAD.  Will aim for better BP control and reassess symptoms at close follow-up. -Obtain echocardiogram to follow #1.   -Continue ASA, statin  4. AI:  -Mild AI noted on echocardiogram in 2018.   -Reassess with echocardiogram  5. H/o PAC and PVCs: -Documented on cardiac monitor in 2017.  -Denies palpitations  6. COPD:  -Followed by Dr. Elsworth Soho.   -Reports multi regimen prednisone therapy over the last several months>> could be driving HR, BP -Requires supplemental oxygen at this point>> with ambulation  7. HLD:  -Controlled w/ Crestor -Lipid panel with PCP   Current medicines are reviewed at length with the patient today.  The patient  does not have concerns regarding medicines.  The following changes have been made: Add losartan 25 mg p.o. daily  Labs/ tests ordered today include: BMET No orders of the defined types were placed in this encounter.   Disposition:   FU with APP in 2 weeks  Signed, Kathyrn Drown, NP  07/01/2019 2:36 PM    [facility] [address], [place], [place]  [address] Phone: [phone]; Fax: [phone]

## 2019-07-01 ENCOUNTER — Ambulatory Visit (INDEPENDENT_AMBULATORY_CARE_PROVIDER_SITE_OTHER): Payer: Medicare Other | Admitting: Cardiology

## 2019-07-01 ENCOUNTER — Encounter: Payer: Self-pay | Admitting: Cardiology

## 2019-07-01 ENCOUNTER — Other Ambulatory Visit: Payer: Self-pay

## 2019-07-01 VITALS — BP 142/74 | HR 74 | Ht 63.0 in | Wt 133.4 lb

## 2019-07-01 DIAGNOSIS — E785 Hyperlipidemia, unspecified: Secondary | ICD-10-CM | POA: Diagnosis not present

## 2019-07-01 DIAGNOSIS — I10 Essential (primary) hypertension: Secondary | ICD-10-CM

## 2019-07-01 DIAGNOSIS — I2510 Atherosclerotic heart disease of native coronary artery without angina pectoris: Secondary | ICD-10-CM

## 2019-07-01 DIAGNOSIS — I428 Other cardiomyopathies: Secondary | ICD-10-CM

## 2019-07-01 DIAGNOSIS — R0602 Shortness of breath: Secondary | ICD-10-CM

## 2019-07-01 MED ORDER — LOSARTAN POTASSIUM 25 MG PO TABS: 25.0000 mg | ORAL_TABLET | Freq: Every day | ORAL | 0 refills | Status: DC

## 2019-07-01 MED ORDER — FUROSEMIDE 40 MG PO TABS: 40.0000 mg | ORAL_TABLET | Freq: Every day | ORAL | 1 refills | Status: DC | PRN

## 2019-07-01 MED ORDER — LOSARTAN POTASSIUM 25 MG PO TABS: 25.0000 mg | ORAL_TABLET | Freq: Every day | ORAL | 3 refills | Status: DC

## 2019-07-01 MED ORDER — LOSARTAN POTASSIUM 25 MG PO TABS: 25.0000 mg | ORAL_TABLET | Freq: Every day | ORAL | 1 refills | Status: DC

## 2019-07-01 NOTE — Patient Instructions (Signed)
Medication Instructions:   Your physician has recommended you make the following change in your medication:   1) Stop Lopressor 2) Start Losartan 25 mg, 1 tablet by mouth once a day  *If you need a refill on your cardiac medications before your next appointment, please call your pharmacy*  Lab Work:  You will have labs drawn today: BMET  If you have labs (blood work) drawn today and your tests are completely normal, you will receive your results only by: Marland Kitchen MyChart Message (if you have MyChart) OR . A paper copy in the mail If you have any lab test that is abnormal or we need to change your treatment, we will call you to review the results.  Testing/Procedures:  Your physician has requested that you have an echocardiogram. Echocardiography is a painless test that uses sound waves to create images of your heart. It provides your doctor with information about the size and shape of your heart and how well your heart's chambers and valves are working. This procedure takes approximately one hour. There are no restrictions for this procedure.  Follow-Up: At [facility], you and your health needs are our priority.  As part of our continuing mission to provide you with exceptional heart care, we have created designated Provider Care Teams.  These Care Teams include your primary Cardiologist (physician) and Advanced Practice Providers (APPs -  Physician Assistants and Nurse Practitioners) who all work together to provide you with the care you need, when you need it.  We recommend signing up for the patient portal called "MyChart".  Sign up information is provided on this After Visit Summary.  MyChart is used to connect with patients for Virtual Visits (Telemedicine).  Patients are able to view lab/test results, encounter notes, upcoming appointments, etc.  Non-urgent messages can be sent to your provider as well.   To learn more about what you can do with MyChart, go to [email or web address].     Your next appointment:   2-3 week(s) after echocardiogram  The format for your next appointment:   In Person  Provider:   You may see Lauree Chandler, MD or Kathyrn Drown, NP

## 2019-07-02 LAB — BASIC METABOLIC PANEL
BUN/Creatinine Ratio: 18 (ref 12–28)
BUN: 13 mg/dL (ref 8–27)
CO2: 25 mmol/L (ref 20–29)
Calcium: 9.7 mg/dL (ref 8.7–10.3)
Chloride: 108 mmol/L — ABNORMAL HIGH (ref 96–106)
Creatinine, Ser: 0.73 mg/dL (ref 0.57–1.00)
GFR calc Af Amer: 100 mL/min/{1.73_m2} (ref >59)
GFR calc non Af Amer: 87 mL/min/{1.73_m2} (ref >59)
Glucose: 93 mg/dL (ref 65–99)
Potassium: 4.2 mmol/L (ref 3.5–5.2)
Sodium: 146 mmol/L — ABNORMAL HIGH (ref 134–144)

## 2019-07-04 ENCOUNTER — Telehealth: Payer: Self-pay | Admitting: *Deleted

## 2019-07-04 NOTE — Telephone Encounter (Signed)
Patient called after hours line. Patient reports she needs to speak with her Physician about her medications

## 2019-07-05 ENCOUNTER — Telehealth: Payer: Self-pay | Admitting: Cardiology

## 2019-07-05 ENCOUNTER — Telehealth: Payer: Self-pay | Admitting: Family Medicine

## 2019-07-05 NOTE — Telephone Encounter (Signed)
Agree. Thanks Pat.

## 2019-07-05 NOTE — Telephone Encounter (Signed)
Spoke to the pt.  She was just getting out of bed.  Pt was very short of breath and gasping with audible wheezing.  She took her BP and it was 208/116 with a heart rate of 107.  She reported trembling and weakness.  Advised that she should call cardiology immediately as her BP reading in higher than what is documented in their office note.  Asked pt if she has started losartan 25 mg.  She said she has taken 3 tablets.  She denied chest pain/pressure, dizziness, headache and blurred vision.  Pt rechecked her BP again while on the phone and it was down to 174/93 with a HR of 105.  She had been sitting in her chair.  Pt was confused as to what to do.  She said her BP will be very high like that but then come down in a few minutes.  Again, I instructed the pt to call the cardiologist office immediately.  Pt did finally agreed.  Will forward to [facility] as FYI or further directions.

## 2019-07-05 NOTE — Telephone Encounter (Signed)
I spoke with patient. She started Losartan on 3/26. She reports the following BP readings- Saturday-6:45 AM -183/106               12:15 PM-201/111               6:05 PM-155/92               6:20 PM-111/66 No readings on Sunday. Monday- 5:40 AM- 193/94                 2:28 PM- 170/95                 2:48 PM-186/99 Heart rate running around 100-107 She checked close together on Saturday and Monday due to shortness of breath. This AM her phone rang and woke her up. She was short of breath walking through house and checked BP at that time and it was 208/116.  Has rechecked and it is now 186/94. She has been taking Losartan at 6 PM.  No BP readings available for the evening. She usually gets up about 9:30 AM.  I asked her to change taking losartan to the morning to see if this would help with BP readings during the day. I told her to take around 2 PM today and then around 10 AM tomorrow and then continue in the AM. I asked her to check BP in the morning about 2 hours after taking Losartan and then again in the evening.  She will call these readings to the office later this week or early next week.

## 2019-07-05 NOTE — Telephone Encounter (Signed)
    Pt c/o BP issue: STAT if pt c/o blurred vision, one-sided weakness or slurred speech  1. What are your last 5 BP readings? 208/116, 186/94 HR 107  2. Are you having any other symptoms (ex. Dizziness, headache, blurred vision, passed out)? SOB when she woke up, she said she is breathing a little better now  3. What is your BP issue? Pt said she doesn't have other symptoms just a little sob. She also said she started taking Losartan on the 26th and today when she woke up her BP is at 208/116 and while on the phone she checked it again it's at 186/94 with HR 107. She request a callback from a nurse.  Please call

## 2019-07-05 NOTE — Telephone Encounter (Signed)
Pt reported having increased SOB and elevated BP.  She wanted to know if this could be from the change from name brand Paxil to generic paroxetine.  Advised that her sx would not be from this change.  Pt has a cardiac hx and has recently seen [facility] for these symptoms.  Will forward to [facility] as [place].

## 2019-07-06 NOTE — Telephone Encounter (Signed)
Agree. Thanks

## 2019-07-06 NOTE — Telephone Encounter (Signed)
Follow up:    Patient calling stating that her BP 111/77 HR 119/  When she moved around it will go up 134. Patient states she is on oxygen. She is also have some SOB.

## 2019-07-06 NOTE — Telephone Encounter (Signed)
HR 117-134 most of the day. Currently 126 bpm, pt is sitting at rest. She remains concerned about her high HR and is not sure of the cause. Wearing O2.  Has COPD.  Completed steroids.  On albuterol nebulizer and inhaler.   BP improved since moving losartan 25 mg to mornings.  Seen by Kathyrn Drown on 07/01/19.  Moved echo up to 07/14/19 from 4/15 Moved 2-3 wk follow up to 4/14 from 4/22. Aware I will forward to [facility] to notify.

## 2019-07-10 ENCOUNTER — Inpatient Hospital Stay (HOSPITAL_COMMUNITY)
Admission: EM | Admit: 2019-07-10 | Discharge: 2019-07-13 | DRG: 190 | Disposition: A | Discharge status: Home / Self Care | Payer: Medicare Other | Attending: Internal Medicine | Admitting: Internal Medicine

## 2019-07-10 ENCOUNTER — Emergency Department (HOSPITAL_COMMUNITY): Payer: Medicare Other

## 2019-07-10 ENCOUNTER — Other Ambulatory Visit: Payer: Self-pay

## 2019-07-10 ENCOUNTER — Encounter (HOSPITAL_COMMUNITY): Payer: Self-pay | Admitting: Emergency Medicine

## 2019-07-10 DIAGNOSIS — Z90710 Acquired absence of both cervix and uterus: Secondary | ICD-10-CM

## 2019-07-10 DIAGNOSIS — Z9981 Dependence on supplemental oxygen: Secondary | ICD-10-CM

## 2019-07-10 DIAGNOSIS — E785 Hyperlipidemia, unspecified: Secondary | ICD-10-CM | POA: Diagnosis present

## 2019-07-10 DIAGNOSIS — F329 Major depressive disorder, single episode, unspecified: Secondary | ICD-10-CM | POA: Diagnosis present

## 2019-07-10 DIAGNOSIS — T380X5A Adverse effect of glucocorticoids and synthetic analogues, initial encounter: Secondary | ICD-10-CM | POA: Diagnosis present

## 2019-07-10 DIAGNOSIS — J9611 Chronic respiratory failure with hypoxia: Secondary | ICD-10-CM | POA: Diagnosis not present

## 2019-07-10 DIAGNOSIS — J4489 Other specified chronic obstructive pulmonary disease: Secondary | ICD-10-CM | POA: Diagnosis present

## 2019-07-10 DIAGNOSIS — Z87891 Personal history of nicotine dependence: Secondary | ICD-10-CM

## 2019-07-10 DIAGNOSIS — Z7982 Long term (current) use of aspirin: Secondary | ICD-10-CM

## 2019-07-10 DIAGNOSIS — R05 Cough: Secondary | ICD-10-CM | POA: Diagnosis present

## 2019-07-10 DIAGNOSIS — D72829 Elevated white blood cell count, unspecified: Secondary | ICD-10-CM | POA: Diagnosis present

## 2019-07-10 DIAGNOSIS — J441 Chronic obstructive pulmonary disease with (acute) exacerbation: Principal | ICD-10-CM | POA: Diagnosis present

## 2019-07-10 DIAGNOSIS — M159 Polyosteoarthritis, unspecified: Secondary | ICD-10-CM | POA: Diagnosis not present

## 2019-07-10 DIAGNOSIS — M1990 Unspecified osteoarthritis, unspecified site: Secondary | ICD-10-CM | POA: Diagnosis present

## 2019-07-10 DIAGNOSIS — I5181 Takotsubo syndrome: Secondary | ICD-10-CM | POA: Diagnosis present

## 2019-07-10 DIAGNOSIS — Z79899 Other long term (current) drug therapy: Secondary | ICD-10-CM

## 2019-07-10 DIAGNOSIS — I479 Paroxysmal tachycardia, unspecified: Secondary | ICD-10-CM | POA: Diagnosis present

## 2019-07-10 DIAGNOSIS — Z7951 Long term (current) use of inhaled steroids: Secondary | ICD-10-CM

## 2019-07-10 DIAGNOSIS — Z20822 Contact with and (suspected) exposure to covid-19: Secondary | ICD-10-CM | POA: Diagnosis present

## 2019-07-10 DIAGNOSIS — I10 Essential (primary) hypertension: Secondary | ICD-10-CM | POA: Diagnosis present

## 2019-07-10 DIAGNOSIS — J449 Chronic obstructive pulmonary disease, unspecified: Secondary | ICD-10-CM | POA: Diagnosis present

## 2019-07-10 DIAGNOSIS — J9621 Acute and chronic respiratory failure with hypoxia: Secondary | ICD-10-CM | POA: Diagnosis present

## 2019-07-10 DIAGNOSIS — F419 Anxiety disorder, unspecified: Secondary | ICD-10-CM | POA: Diagnosis present

## 2019-07-10 DIAGNOSIS — F32A Depression, unspecified: Secondary | ICD-10-CM | POA: Diagnosis present

## 2019-07-10 DIAGNOSIS — R058 Other specified cough: Secondary | ICD-10-CM | POA: Diagnosis present

## 2019-07-10 DIAGNOSIS — J9610 Chronic respiratory failure, unspecified whether with hypoxia or hypercapnia: Secondary | ICD-10-CM | POA: Diagnosis present

## 2019-07-10 DIAGNOSIS — I351 Nonrheumatic aortic (valve) insufficiency: Secondary | ICD-10-CM | POA: Diagnosis not present

## 2019-07-10 LAB — CBC WITH DIFFERENTIAL/PLATELET
Abs Immature Granulocytes: 0.03 10*3/uL (ref 0.00–0.07)
Basophils Absolute: 0.2 10*3/uL — ABNORMAL HIGH (ref 0.0–0.1)
Basophils Relative: 1 %
Eosinophils Absolute: 0.9 10*3/uL — ABNORMAL HIGH (ref 0.0–0.5)
Eosinophils Relative: 6 %
HCT: 43.7 % (ref 36.0–46.0)
Hemoglobin: 13.9 g/dL (ref 12.0–15.0)
Immature Granulocytes: 0 %
Lymphocytes Relative: 54 %
Lymphs Abs: 7.5 10*3/uL — ABNORMAL HIGH (ref 0.7–4.0)
MCH: 31.2 pg (ref 26.0–34.0)
MCHC: 31.8 g/dL (ref 30.0–36.0)
MCV: 98 fL (ref 80.0–100.0)
Monocytes Absolute: 1.2 10*3/uL — ABNORMAL HIGH (ref 0.1–1.0)
Monocytes Relative: 8 %
Neutro Abs: 4.3 10*3/uL (ref 1.7–7.7)
Neutrophils Relative %: 31 %
Platelets: 245 10*3/uL (ref 150–400)
RBC: 4.46 MIL/uL (ref 3.87–5.11)
RDW: 13.5 % (ref 11.5–15.5)
WBC: 14.1 10*3/uL — ABNORMAL HIGH (ref 4.0–10.5)
nRBC: 0 % (ref 0.0–0.2)

## 2019-07-10 LAB — COMPREHENSIVE METABOLIC PANEL
ALT: 14 U/L (ref 0–44)
AST: 17 U/L (ref 15–41)
Albumin: 3.7 g/dL (ref 3.5–5.0)
Alkaline Phosphatase: 72 U/L (ref 38–126)
Anion gap: 10 (ref 5–15)
BUN: 12 mg/dL (ref 8–23)
CO2: 27 mmol/L (ref 22–32)
Calcium: 9.6 mg/dL (ref 8.9–10.3)
Chloride: 105 mmol/L (ref 98–111)
Creatinine, Ser: 0.79 mg/dL (ref 0.44–1.00)
GFR calc Af Amer: >60 mL/min (ref >60)
GFR calc non Af Amer: >60 mL/min (ref >60)
Glucose, Bld: 142 mg/dL — ABNORMAL HIGH (ref 70–99)
Potassium: 4 mmol/L (ref 3.5–5.1)
Sodium: 142 mmol/L (ref 135–145)
Total Bilirubin: 0.4 mg/dL (ref 0.3–1.2)
Total Protein: 6.2 g/dL — ABNORMAL LOW (ref 6.5–8.1)

## 2019-07-10 LAB — TROPONIN I (HIGH SENSITIVITY)
Troponin I (High Sensitivity): 17 ng/L (ref <18)
Troponin I (High Sensitivity): 29 ng/L — ABNORMAL HIGH (ref <18)

## 2019-07-10 LAB — BRAIN NATRIURETIC PEPTIDE: B Natriuretic Peptide: 42.4 pg/mL (ref 0.0–100.0)

## 2019-07-10 MED ORDER — ALBUTEROL SULFATE HFA 108 (90 BASE) MCG/ACT IN AERS
6.0000 | INHALATION_SPRAY | Freq: Once | RESPIRATORY_TRACT | Status: AC
  Administered 2019-07-10: 6 via RESPIRATORY_TRACT
  Filled 2019-07-10: qty 6.7

## 2019-07-10 NOTE — ED Triage Notes (Signed)
Pt arrives via EMS from home with reports of resp distress. Hx of COPD. Wheezing noted in all fields. 125 mg solu-medrol, 0.3 IM epi given by EMS. Home O2 0.5L. NRB on arrival for work of breathing.  130/80 BP, HR 110, 20G LAC

## 2019-07-10 NOTE — H&P (Addendum)
History and Physical    Kim [name] [id number] DOB: [birth year] DOA: 07/10/2019  PCP: [name], NP  Patient coming from: Home  Chief Complaint: SOB, wheezing  HPI: Kim [name] is a 64 y.o. female with medical history significant of COPD, OA, HLD, Depression and previous tobacco use who presents for wheezing and SOB.  [name] reports that for the last 3-4 weeks, she has not been feeling well.  She notes fatigue, SOB, cough, wheezing which have all worsened.  She has been in contact with her PCP and a TTE has been ordered for intermittent elevated blood pressures as well as intermittent tachycardia.  These seem to come on in the middle of the night while sleeping.  Over the past week, she has had worsening of wheezing.  She has been using her oxygen for the last 3 days overnight and through the day, where she has only needed it PRN previously.  She has also increased the use of her inhalers and nebs.  She has had non productive cough and no change to sputum.  She has been reluctant to come in over this time as she thought getting steroids would delay her 2nd dose of the COVID 19 vaccine.  She has had no fevers, chills, nausea, vomiting, decreased PO intake.  She has been staying home and avoiding any sick contacts.  She denies any urinary or skin symptoms.   ED Course: With EMS, she was given solumedrol and epinephrine 1:1.  She was slow to respond, but was able to be weaned down to [facility] of oxygen.  Her blood work showed an elevated glucose and elevated WBC to 14.  She further had a tnI of 17 and a BNP of 42.4.  CXR showed no acute findings of pneumonia.  EKG showed sinus rhythm.    Review of Systems: As per HPI otherwise 10 point review of systems negative.    Past Medical History:  Diagnosis Date  . C8 RADICULOPATHY 06/05/2009  . COPD 08/31/2007  . DEPRESSION 11/19/2006  . DYSLIPIDEMIA 03/12/2009  . MI (mitral incompetence)   . 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 OSTEOARTHRITIS  06/05/2009  . PARESTHESIA 08/24/2007  . TOBACCO ABUSE 01/25/2010    Past Surgical History:  Procedure Laterality Date  . ABDOMINAL HYSTERECTOMY    . APPENDECTOMY  1969  . CARDIAC CATHETERIZATION N/A 07/05/2015   Procedure: Left Heart Cath and Coronary Angiography;  Surgeon: Leonie Man, MD;  Location: [place] [facility];  Service: Cardiovascular;  Laterality: N/A;  . CHOLECYSTECTOMY  1989  . collapse lung  1990  . COLONOSCOPY WITH PROPOFOL N/A 05/14/2018   Procedure: COLONOSCOPY WITH PROPOFOL;  Surgeon: Irene Shipper, MD;  Location: [facility];  Service: Endoscopy;  Laterality: N/A;  . EXCISION MASS ABDOMINAL  2019  . EXPLORATORY LAPAROTOMY  1969  . POLYPECTOMY  05/14/2018   Procedure: POLYPECTOMY;  Surgeon: Irene Shipper, MD;  Location: [facility];  Service: Endoscopy;;  . SALPINGOOPHORECTOMY  2019   Reviewed with patient.   reports that she quit smoking about 5 years ago. Her smoking use included cigarettes. She has a 76.00 pack-year smoking history. She has never used smokeless tobacco. She reports that she does not drink alcohol or use drugs.  Allergies  Allergen Reactions  . Ciprofloxacin     Hallucinations   . Lisinopril     cough  . Penicillins Hives    Has patient had a PCN reaction causing immediate rash, facial/tongue/throat swelling, SOB or lightheadedness with hypotension:  Yes Has patient had a PCN reaction causing severe rash involving mucus membranes or skin necrosis: No Has patient had a PCN reaction that required hospitalization No Has patient had a PCN reaction occurring within the last 10 years: No If all of the above answers are "NO", then may proceed with Cephalosporin use.     Family History  Problem Relation Age of Onset  . Cancer Mother        lung  . Heart attack Mother   . Hypertension Sister   . Multiple sclerosis Brother   . Diabetes Sister   . Rheum arthritis Sister   . Thyroid disease Sister   . Multiple sclerosis Other   . Alcohol abuse  Other   . Arthritis Other   . Diabetes Other   . Kidney disease Other   . Cancer Other        lung,ovarian,skin, uterine  . Stroke Other   . Heart disease Other   . Melanoma Other   . Heart attack Maternal Uncle   . Heart attack Other        NEICE  . Heart attack Other        NEPHEW  . Hypertension Brother   . Stroke Maternal Grandmother   . Colon cancer Neg Hx     Prior to Admission medications   Medication Sig Start Date End Date Taking? Authorizing Provider  albuterol (VENTOLIN HFA) 108 (90 Base) MCG/ACT inhaler Take two puffs every 4-6 hours as needed for shortness of breath/wheezing 06/03/19   Martyn Ehrich, NP  ALPRAZolam Duanne Moron) 0.5 MG tablet TAKE 1/2 TO 1 TABLET BY MOUTH THREE TIMES DAILY 12/28/18   [name], NP  aspirin EC 81 MG EC tablet Take 1 tablet (81 mg total) by mouth daily. 07/08/15   Nita Sells, MD  budesonide-formoterol (SYMBICORT) 160-4.5 MCG/ACT inhaler Inhale 2 puffs into the lungs 2 (two) times daily. 03/16/18   [name], MD  Cholecalciferol (VITAMIN D3) 1000 units CAPS Take 1 capsule (1,000 Units total) by mouth 2 (two) times daily. 08/13/15   Isaiah Serge, NP  furosemide (LASIX) 40 MG tablet Take 1 tablet (40 mg total) by mouth daily as needed (for weight gain of 3 lbs). 07/01/19   Kathyrn Drown D, NP  HYDROcodone-acetaminophen (NORCO) 7.5-325 MG tablet Take 1 tablet by mouth every 6 (six) hours as needed for moderate pain. 03/08/19   Nafziger, Tommi Rumps, NP  ipratropium-albuterol (DUONEB) 0.5-2.5 (3) MG/3ML SOLN Take 3 mLs by nebulization every 6 (six) hours as needed. Dx: J44.9 05/31/19   [name], MD  losartan (COZAAR) 25 MG tablet Take 1 tablet (25 mg total) by mouth daily. 07/01/19   Tommie Raymond, NP  nitroGLYCERIN (NITROSTAT) 0.4 MG SL tablet Place 1 tablet (0.4 mg total) under the tongue every 5 (five) minutes as needed for chest pain. 08/09/18   Lyda Jester M, PA-C  PARoxetine (PAXIL CR) 25 MG 24 hr tablet Take 1 tablet  (25 mg total) by mouth every morning. 03/29/19   [name], NP  Spacer/Aero Chamber Mouthpiece MISC 1 Device by Does not apply route as directed. 01/12/17   Javier Glazier, MD  Tiotropium Bromide Monohydrate (SPIRIVA RESPIMAT) 2.5 MCG/ACT AERS USE 2 INHALATIONS BY MOUTH  DAILY 03/29/19   Nafziger, Tommi Rumps, NP  vitamin B-12 (CYANOCOBALAMIN) 500 MCG tablet Take 500 mcg by mouth 2 (two) times daily.    [provider]    Physical Exam: Vitals:   07/10/19 2130 07/10/19 2200  07/10/19 2215 07/10/19 2230  BP: 128/67 122/74 121/77 (!) 112/54  Pulse: 90 87 83 81  Resp: (!) 24 (!) 22 19 (!) 21  Temp:      TempSrc:      SpO2: 94% 97% 96% 97%  Weight:      Height:        Constitutional: NAD, calm, comfortable, lying in bed Eyes: PERRL, lids and conjunctivae normal ENMT: Mucous membranes are moist. Normal dentition.  Neck: normal, supple Respiratory: Decreased air movement bilaterally, diffuse expiratory wheezing.  Dry cough with deep breathing.  No rales or rhonchi.  Cardiovascular: Mildly tachycardic, normal rhythm, no murmur, no LE edema.  Abdomen: +BS, NT, ND Musculoskeletal: no clubbing / cyanosis. Normal muscle tone.  Skin: no rashes, lesions, ulcers on exposed skin Neurologic: CN 2-12 grossly intact. Moving all extremities without issue.  Psychiatric: Normal judgment and insight. Alert and oriented x 3. Normal mood.    Labs on Admission: I have personally reviewed following labs and imaging studies  CBC: Recent Labs  Lab 07/10/19 1915  WBC 14.1*  NEUTROABS 4.3  HGB 13.9  HCT 43.7  MCV 98.0  PLT 99991111   Basic Metabolic Panel: Recent Labs  Lab 07/10/19 1915  NA 142  K 4.0  CL 105  CO2 27  GLUCOSE 142*  BUN 12  CREATININE 0.79  CALCIUM 9.6   GFR: Estimated Creatinine Clearance: 58 mL/min (by C-G formula based on SCr of 0.79 mg/dL). Liver Function Tests: Recent Labs  Lab 07/10/19 1915  AST 17  ALT 14  ALKPHOS 72  BILITOT 0.4  PROT 6.2*  ALBUMIN  3.7   No results for input(s): LIPASE, AMYLASE in the last 168 hours. No results for input(s): AMMONIA in the last 168 hours. Coagulation Profile: No results for input(s): INR, PROTIME in the last 168 hours. Cardiac Enzymes: No results for input(s): CKTOTAL, CKMB, CKMBINDEX, TROPONINI in the last 168 hours. BNP (last 3 results) No results for input(s): PROBNP in the last 8760 hours. HbA1C: No results for input(s): HGBA1C in the last 72 hours. CBG: No results for input(s): GLUCAP in the last 168 hours. Lipid Profile: No results for input(s): CHOL, HDL, LDLCALC, TRIG, CHOLHDL, LDLDIRECT in the last 72 hours. Thyroid Function Tests: No results for input(s): TSH, T4TOTAL, FREET4, T3FREE, THYROIDAB in the last 72 hours. Anemia Panel: No results for input(s): VITAMINB12, FOLATE, FERRITIN, TIBC, IRON, RETICCTPCT in the last 72 hours. Urine analysis:    Component Value Date/Time   COLORURINE yellow 02/26/2010 0951   APPEARANCEUR Clear 02/26/2010 0951   LABSPEC 1.025 02/26/2010 0951   PHURINE 5.0 02/26/2010 0951   HGBUR trace-lysed 02/26/2010 0951   BILIRUBINUR Negative 01/14/2018 0859   PROTEINUR Negative 01/14/2018 0859   UROBILINOGEN 0.2 01/14/2018 0859   UROBILINOGEN 0.2 02/26/2010 0951   NITRITE Negative 01/14/2018 0859   NITRITE negative 02/26/2010 0951   LEUKOCYTESUR Negative 01/14/2018 0859    Radiological Exams on Admission: DG Chest 2 View  Result Date: 07/10/2019 CLINICAL DATA:  Shortness of breath. History of COPD. Wheezing. EXAM: CHEST - 2 VIEW COMPARISON:  Chest x-ray dated 05/19/2019. FINDINGS: Heart size and mediastinal contours are within normal limits. Lungs are hyperexpanded. Lungs are clear. No pleural effusion or pneumothorax is seen. No acute or suspicious osseous finding. IMPRESSION: 1. No acute findings. No evidence of pneumonia or pulmonary edema. 2. COPD. Electronically Signed   By: Franki Cabot M.D.   On: 07/10/2019 20:08    EKG: Independently reviewed.  Sinus rhythm, no  apparent Tw or ST changes  Assessment/Plan COPD with acute exacerbation Acute on Chronic respiratory failure, hypoxia - She will be admitted for Acute COPD - Prednisone 40mg  daily for 5 days - Oxygen therapy to keep O2 saturation 88-92% - Nebs PRN - No antibiotics at this time, elevated WBC likely related to steroids - Telemetry given elevated HR - LAMA/LABA combination with ICS as per home regimen (with pharmacy subs) - COVID testing pending - Sx more consistent with COPD exac.   Paroxysmal tachycardia, hypertension - Interesting course.  She notes worsening of these symptoms at night.  She is on chronic xanax, wonder if she could be having withdrawal's overnight?  - Other issues could be endocrine -- Paraganglioma, etc.  These would need to be evaluated for outpatient when she is not ill - Will plan to get TTE for further evaluation as per PCP orders (was supposed to have on 4/8) and monitor on telemetry while she is in house.   - BNP and lack of any edema would lead away from dx of CHF  Anxiety and depression - Continue home xanax and paxil  Osteoarthritis - Continue home hydrocodone-apap   Leukocytosis - Trend, likely related to steroids - Monitor for any signs/symtpoms of infection     DVT prophylaxis: Lovenox  Code Status: Full  Family Communication: Husband at bedside  Disposition Plan: Will likely return home once improved  Consults called: None  Admission status: INP, medical tele   Gilles Chiquito MD [facility]   If 7PM-7AM, please contact night-coverage [email or web address] Use universal [place] password for that web site. If you do not have the password, please call the hospital operator.  07/10/2019, 10:49 PM

## 2019-07-11 ENCOUNTER — Inpatient Hospital Stay (HOSPITAL_COMMUNITY): Payer: Medicare Other

## 2019-07-11 DIAGNOSIS — I10 Essential (primary) hypertension: Secondary | ICD-10-CM

## 2019-07-11 DIAGNOSIS — I351 Nonrheumatic aortic (valve) insufficiency: Secondary | ICD-10-CM

## 2019-07-11 LAB — ECHOCARDIOGRAM COMPLETE
Height: 63 in
Weight: 2118.4 oz

## 2019-07-11 LAB — SARS CORONAVIRUS 2 (TAT 6-24 HRS): SARS Coronavirus 2: NEGATIVE

## 2019-07-11 MED ORDER — FUROSEMIDE 40 MG PO TABS: 40.0000 mg | ORAL_TABLET | Freq: Every day | ORAL | Status: DC | PRN

## 2019-07-11 MED ORDER — CYANOCOBALAMIN 500 MCG PO TABS
500.0000 ug | ORAL_TABLET | Freq: Two times a day (BID) | ORAL | Status: DC
  Administered 2019-07-11 – 2019-07-13 (×4): 500 ug via ORAL
  Filled 2019-07-11 (×6): qty 1

## 2019-07-11 MED ORDER — PAROXETINE HCL ER 12.5 MG PO TB24
25.0000 mg | ORAL_TABLET | Freq: Every morning | ORAL | Status: DC
  Administered 2019-07-11 – 2019-07-13 (×3): 25 mg via ORAL
  Filled 2019-07-11: qty 1
  Filled 2019-07-11 (×2): qty 2
  Filled 2019-07-11: qty 1
  Filled 2019-07-11: qty 2

## 2019-07-11 MED ORDER — UMECLIDINIUM BROMIDE 62.5 MCG/INH IN AEPB
1.0000 | INHALATION_SPRAY | Freq: Every day | RESPIRATORY_TRACT | Status: DC
  Administered 2019-07-11 – 2019-07-13 (×3): 1 via RESPIRATORY_TRACT
  Filled 2019-07-11: qty 7

## 2019-07-11 MED ORDER — NITROGLYCERIN 0.4 MG SL SUBL: 0.4000 mg | SUBLINGUAL_TABLET | SUBLINGUAL | Status: DC | PRN

## 2019-07-11 MED ORDER — SODIUM CHLORIDE 0.9% FLUSH
3.0000 mL | Freq: Two times a day (BID) | INTRAVENOUS | Status: DC
  Administered 2019-07-11 – 2019-07-13 (×5): 3 mL via INTRAVENOUS

## 2019-07-11 MED ORDER — ALPRAZOLAM 0.5 MG PO TABS
0.5000 mg | ORAL_TABLET | Freq: Three times a day (TID) | ORAL | Status: DC | PRN
  Administered 2019-07-11 – 2019-07-12 (×2): 0.5 mg via ORAL
  Filled 2019-07-11: qty 2
  Filled 2019-07-11: qty 1

## 2019-07-11 MED ORDER — IPRATROPIUM-ALBUTEROL 0.5-2.5 (3) MG/3ML IN SOLN
3.0000 mL | Freq: Three times a day (TID) | RESPIRATORY_TRACT | Status: DC
  Administered 2019-07-11 – 2019-07-13 (×5): 3 mL via RESPIRATORY_TRACT
  Filled 2019-07-11 (×5): qty 3

## 2019-07-11 MED ORDER — IPRATROPIUM-ALBUTEROL 0.5-2.5 (3) MG/3ML IN SOLN
3.0000 mL | Freq: Four times a day (QID) | RESPIRATORY_TRACT | Status: DC
  Administered 2019-07-11: 3 mL via RESPIRATORY_TRACT
  Filled 2019-07-11: qty 3

## 2019-07-11 MED ORDER — VITAMIN D 25 MCG (1000 UNIT) PO TABS
1000.0000 [IU] | ORAL_TABLET | Freq: Two times a day (BID) | ORAL | Status: DC
  Administered 2019-07-11 – 2019-07-13 (×4): 1000 [IU] via ORAL
  Filled 2019-07-11 (×5): qty 1

## 2019-07-11 MED ORDER — IPRATROPIUM-ALBUTEROL 0.5-2.5 (3) MG/3ML IN SOLN: 3.0000 mL | Freq: Four times a day (QID) | RESPIRATORY_TRACT | Status: DC | PRN

## 2019-07-11 MED ORDER — HYDROCODONE-ACETAMINOPHEN 7.5-325 MG PO TABS
1.0000 | ORAL_TABLET | Freq: Four times a day (QID) | ORAL | Status: DC | PRN
  Administered 2019-07-11: 1 via ORAL
  Filled 2019-07-11: qty 1

## 2019-07-11 MED ORDER — LOSARTAN POTASSIUM 25 MG PO TABS
25.0000 mg | ORAL_TABLET | Freq: Every day | ORAL | Status: DC
  Administered 2019-07-11 – 2019-07-13 (×3): 25 mg via ORAL
  Filled 2019-07-11 (×4): qty 1

## 2019-07-11 MED ORDER — ASPIRIN EC 81 MG PO TBEC
81.0000 mg | DELAYED_RELEASE_TABLET | Freq: Every day | ORAL | Status: DC
  Administered 2019-07-11 – 2019-07-13 (×3): 81 mg via ORAL
  Filled 2019-07-11 (×3): qty 1

## 2019-07-11 MED ORDER — METHYLPREDNISOLONE SODIUM SUCC 40 MG IJ SOLR
40.0000 mg | Freq: Two times a day (BID) | INTRAMUSCULAR | Status: DC
  Administered 2019-07-11 – 2019-07-13 (×5): 40 mg via INTRAVENOUS
  Filled 2019-07-11 (×5): qty 1

## 2019-07-11 MED ORDER — ALBUTEROL SULFATE (2.5 MG/3ML) 0.083% IN NEBU
3.0000 mL | INHALATION_SOLUTION | RESPIRATORY_TRACT | Status: DC | PRN
  Administered 2019-07-11: 3 mL via RESPIRATORY_TRACT
  Filled 2019-07-11: qty 3

## 2019-07-11 MED ORDER — PREDNISONE 20 MG PO TABS: 40.0000 mg | ORAL_TABLET | Freq: Every day | ORAL | Status: DC

## 2019-07-11 MED ORDER — ENOXAPARIN SODIUM 40 MG/0.4ML ~~LOC~~ SOLN
40.0000 mg | SUBCUTANEOUS | Status: DC
  Administered 2019-07-11: 40 mg via SUBCUTANEOUS
  Filled 2019-07-11 (×2): qty 0.4

## 2019-07-11 MED ORDER — MOMETASONE FURO-FORMOTEROL FUM 200-5 MCG/ACT IN AERO
2.0000 | INHALATION_SPRAY | Freq: Two times a day (BID) | RESPIRATORY_TRACT | Status: DC
  Administered 2019-07-11 – 2019-07-13 (×5): 2 via RESPIRATORY_TRACT
  Filled 2019-07-11: qty 8.8

## 2019-07-11 NOTE — Plan of Care (Signed)

## 2019-07-11 NOTE — Progress Notes (Signed)
  Echocardiogram 2D Echocardiogram has been performed.  Kim [name] 07/11/2019, 11:19 AM

## 2019-07-11 NOTE — Progress Notes (Signed)
PROGRESS NOTE        PATIENT DETAILS Name: Kim [name] Age: 64 y.o. Sex: female Date of Birth: 1956/02/01 Admit Date: 07/10/2019 Admitting Physician Sid Falcon, MD [id number], Tommi Rumps, NP  Brief Narrative: Patient is a 64 y.o. female with history of COPD-on 2 L of oxygen at home as needed, history of Takotsubo cardiomyopathy who presented to the hospital with worsening shortness of breath for the past 2-3 weeks-thought to have COPD exacerbation and admitted to the hospitalist service.    Patient has had multiple episodes of cough/worsening shortness of breath since January 2021 and has completed several rounds of steroids and antimicrobial therapy.  Significant events: 4/4>> admit to [facility] for COPD exacerbation  Antimicrobial therapy: None  Microbiology data: None  Procedures : None  Consults: None  DVT Prophylaxis : Prophylactic Lovenox   Subjective: Feels better than yesterday-although still wheezing-still feels tightness in the chest-not yet back to baseline.  Assessment/Plan: COPD exacerbation: Improved-but not yet back to baseline-still wheezing-diminished air entry at bases.  Continue steroids, bronchodilators and other supportive care.  Chest x-ray without pneumonia.  She is on prn home O2 -2L/m  Paroxysmal tachycardia/hypertension: Apparently has had these episodes for the past few weeks-especially at night.  Telemetry reviewed personally-no arrhythmias detected.  May need sleep study and further work-up in the outpatient setting.  Await echocardiogram  Anxiety/depression: Appears stable-continue Xanax and Paxil  Chronic hypoxic respiratory failure: On 2 L of oxygen as needed-currently stable on 2 L.  HTN: Stable with losartan  History of Takotsubo cardiomyopathy: Last echo on 2018-with preserved EF.  Volume status stable.  Diet: Diet Order            Diet heart healthy/carb modified Room service appropriate? Yes; Fluid  consistency: Thin  Diet effective now               Code Status: Full code   Family Communication: None at bedside-patient awake/alert-we will update family herself.  Disposition Plan: Home when ready for discharge-probably requires another few more days of hospitalization  Barriers to Discharge: COPD exacerbation requiring 24/7 oxygen supplementation and IV steroids-patient not yet at baseline  Antimicrobial agents: Anti-infectives (From admission, onward)   None      Time spent: 25 minutes-Greater than 50% of this time was spent in counseling, explanation of diagnosis, planning of further management, and coordination of care.  MEDICATIONS: Scheduled Meds: . aspirin EC  81 mg Oral Daily  . cholecalciferol  1,000 Units Oral BID  . enoxaparin (LOVENOX) injection  40 mg Subcutaneous Q24H  . losartan  25 mg Oral Daily  . methylPREDNISolone (SOLU-MEDROL) injection  40 mg Intravenous Q12H  . mometasone-formoterol  2 puff Inhalation BID  . PARoxetine  25 mg Oral q morning - 10a  . sodium chloride flush  3 mL Intravenous Q12H  . umeclidinium bromide  1 puff Inhalation Daily  . vitamin B-12  500 mcg Oral BID   Continuous Infusions: PRN Meds:.albuterol, ALPRAZolam, furosemide, HYDROcodone-acetaminophen, ipratropium-albuterol, nitroGLYCERIN   PHYSICAL EXAM: Vital signs: Vitals:   07/11/19 0409 07/11/19 0441 07/11/19 0756 07/11/19 0809  BP:    113/72  Pulse:  87  (!) 109  Resp: (!) 21 20  20   Temp:    97.7 F (36.5 C)  TempSrc:    Oral  SpO2:   97% 94%  Weight:  Height:       Filed Weights   07/10/19 1917 07/11/19 0329  Weight: 60.5 kg 60.1 kg   Body mass index is 23.45 kg/m.   Gen Exam:Alert awake-not in any distress HEENT:atraumatic, normocephalic Chest: Diminished air entry at bases-still with rhonchi bilaterally. CVS:S1S2 regular Abdomen:soft non tender, non distended Extremities:no edema Neurology: Non focal Skin: no rash  I have personally  reviewed following labs and imaging studies  LABORATORY DATA: CBC: Recent Labs  Lab 07/10/19 1915  WBC 14.1*  NEUTROABS 4.3  HGB 13.9  HCT 43.7  MCV 98.0  PLT 99991111    Basic Metabolic Panel: Recent Labs  Lab 07/10/19 1915  NA 142  K 4.0  CL 105  CO2 27  GLUCOSE 142*  BUN 12  CREATININE 0.79  CALCIUM 9.6    GFR: Estimated Creatinine Clearance: 58 mL/min (by C-G formula based on SCr of 0.79 mg/dL).  Liver Function Tests: Recent Labs  Lab 07/10/19 1915  AST 17  ALT 14  ALKPHOS 72  BILITOT 0.4  PROT 6.2*  ALBUMIN 3.7   No results for input(s): LIPASE, AMYLASE in the last 168 hours. No results for input(s): AMMONIA in the last 168 hours.  Coagulation Profile: No results for input(s): INR, PROTIME in the last 168 hours.  Cardiac Enzymes: No results for input(s): CKTOTAL, CKMB, CKMBINDEX, TROPONINI in the last 168 hours.  BNP (last 3 results) No results for input(s): PROBNP in the last 8760 hours.  Lipid Profile: No results for input(s): CHOL, HDL, LDLCALC, TRIG, CHOLHDL, LDLDIRECT in the last 72 hours.  Thyroid Function Tests: No results for input(s): TSH, T4TOTAL, FREET4, T3FREE, THYROIDAB in the last 72 hours.  Anemia Panel: No results for input(s): VITAMINB12, FOLATE, FERRITIN, TIBC, IRON, RETICCTPCT in the last 72 hours.  Urine analysis:    Component Value Date/Time   COLORURINE yellow 02/26/2010 0951   APPEARANCEUR Clear 02/26/2010 0951   LABSPEC 1.025 02/26/2010 0951   PHURINE 5.0 02/26/2010 0951   HGBUR trace-lysed 02/26/2010 0951   BILIRUBINUR Negative 01/14/2018 0859   PROTEINUR Negative 01/14/2018 0859   UROBILINOGEN 0.2 01/14/2018 0859   UROBILINOGEN 0.2 02/26/2010 0951   NITRITE Negative 01/14/2018 0859   NITRITE negative 02/26/2010 0951   LEUKOCYTESUR Negative 01/14/2018 0859    Sepsis Labs: Lactic Acid, Venous    Component Value Date/Time   LATICACIDVEN 1.8 12/03/2016 [id number]    MICROBIOLOGY: Recent Results (from the past 240  hour(s))  SARS CORONAVIRUS 2 (TAT 6-24 HRS) Nasopharyngeal Nasopharyngeal Swab     Status: None   Collection Time: 07/10/19  8:50 PM   Specimen: Nasopharyngeal Swab  Result Value Ref Range Status   SARS Coronavirus 2 NEGATIVE NEGATIVE Final    Comment: (NOTE) SARS-CoV-2 target nucleic acids are NOT DETECTED. The SARS-CoV-2 RNA is generally detectable in upper and lower respiratory specimens during the acute phase of infection. Negative results do not preclude SARS-CoV-2 infection, do not rule out co-infections with other pathogens, and should not be used as the sole basis for treatment or other patient management decisions. Negative results must be combined with clinical observations, patient history, and epidemiological information. The expected result is Negative. Fact Sheet for Patients: SugarRoll.be Fact Sheet for Healthcare Providers: https://www.woods-mathews.com/ This test is not yet approved or cleared by the [place] FDA and  has been authorized for detection and/or diagnosis of SARS-CoV-2 by FDA under an Emergency Use Authorization (EUA). This EUA will remain  in effect (meaning this test can be used) for the duration  of the COVID-19 declaration under Section 56 4(b)(1) of the Act, 21 U.S.C. section 360bbb-3(b)(1), unless the authorization is terminated or revoked sooner. Performed at [facility], [place] [address] [place], [place] [address]     RADIOLOGY STUDIES/RESULTS: DG Chest 2 View  Result Date: 07/10/2019 CLINICAL DATA:  Shortness of breath. History of COPD. Wheezing. EXAM: CHEST - 2 VIEW COMPARISON:  Chest x-ray dated 05/19/2019. FINDINGS: Heart size and mediastinal contours are within normal limits. Lungs are hyperexpanded. Lungs are clear. No pleural effusion or pneumothorax is seen. No acute or suspicious osseous finding. IMPRESSION: 1. No acute findings. No evidence of pneumonia or pulmonary edema. 2. COPD.  Electronically Signed   By: Franki Cabot M.D.   On: 07/10/2019 20:08     LOS: 1 day   Oren Binet, MD  [facility]    To contact the attending provider between 7A-7P or the covering provider during after hours 7P-7A, please log into the web site [email or web address] and access using universal [place] password for that web site. If you do not have the password, please call the hospital operator.  07/11/2019, 10:21 AM

## 2019-07-11 NOTE — Evaluation (Signed)
Physical Therapy Evaluation & Discharge Patient Details Name: Kim [name] MRN: [id number] DOB: December 24, 1955 Today's Date: 07/11/2019   History of Present Illness  Pt is a 64 y.o. female admitted 07/10/19 with dyspnea. Worked up for COPD exacerbation. CXR without acute abnormality. PMH includes COPD, MI, OA, C8 radiculopathy.    Clinical Impression  Patient evaluated by Physical Therapy with no further acute PT needs identified. 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 Today, pt independent with mobility and ADLs. SpO2 down to 85% on RA with activity (see saturations qualifications note); DOE 2-3/4 while walking. All education has been completed and the patient has no further questions. PT is signing off. Thank you for this referral.    Follow Up Recommendations No PT follow up    Equipment Recommendations  None recommended by PT    Recommendations for Other Services       Precautions / Restrictions Precautions Precautions: Other (comment) Precaution Comments: Watch SpO2 Restrictions Weight Bearing Restrictions: No      Mobility  Bed Mobility Overal bed mobility: Independent                Transfers Overall transfer level: Independent Equipment used: None                Ambulation/Gait Ambulation/Gait assistance: Independent Gait Distance (Feet): 450 Feet Assistive device: None Gait Pattern/deviations: WFL(Within Functional Limits)   Gait velocity interpretation: 1.31 - 2.62 ft/sec, indicative of limited community ambulator General Gait Details: Slow, steady gait independent without DME. SpO2 down to 85% on RA, maintaining >/90% on 2L. DOE 2-3/4 (pt talking and wearing face mask while walking), pt reports this improving  Stairs            Wheelchair Mobility    Modified Rankin (Stroke Patients Only)       Balance Overall balance  assessment: No apparent balance deficits (not formally assessed)                                           Pertinent Vitals/Pain Pain Assessment: No/denies pain    Home Living Family/patient expects to be discharged to:: Private residence Living Arrangements: Spouse/significant other Available Help at Discharge: Family Type of Home: Apartment Home Access: Stairs to enter Entrance Stairs-Rails: Left Entrance Stairs-Number of Steps: 14 Home Layout: One level Home Equipment: None Additional Comments: Lives in garage apartment    Prior Function Level of Independence: Independent         Comments: 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 Pt wears 2L O2 as needed, but has been wearing consistently at home the past few days; has pulse ox     Hand Dominance        Extremity/Trunk Assessment   Upper Extremity Assessment Upper Extremity Assessment: Overall WFL for tasks assessed    Lower Extremity Assessment Lower Extremity Assessment: Overall WFL for tasks assessed    Cervical / Trunk Assessment Cervical / Trunk Assessment: Normal  Communication   Communication: No difficulties  Cognition Arousal/Alertness: Awake/alert Behavior During Therapy: WFL for tasks assessed/performed Overall Cognitive Status: Within Functional Limits for tasks assessed  General Comments      Exercises     Assessment/Plan    PT Assessment Patent does not need any further PT services  PT Problem List         PT Treatment Interventions      PT Goals (Current goals can be found in the Care Plan section)  Acute Rehab PT Goals PT Goal Formulation: All assessment and education complete, DC therapy    Frequency     Barriers to discharge        Co-evaluation               AM-PAC PT "6 Clicks" Mobility  Outcome Measure Help needed turning from your back to your side while in a flat bed without using  bedrails?: None Help needed moving from lying on your back to sitting on the side of a flat bed without using bedrails?: None Help needed moving to and from a bed to a chair (including a wheelchair)?: None Help needed standing up from a chair using your arms (e.g., wheelchair or bedside chair)?: None Help needed to walk in hospital room?: None Help needed climbing 3-5 steps with a railing? : None 6 Click Score: 24    End of Session Equipment Utilized During Treatment: Oxygen Activity Tolerance: Patient tolerated treatment well Patient left: in chair;with call bell/phone within reach;with nursing/sitter in room Nurse Communication: Mobility status PT Visit Diagnosis: Other abnormalities of gait and mobility (R26.89)    [id number] PT Time Calculation (min) (ACUTE ONLY): 18 min   Charges:   PT Evaluation $PT Eval Low Complexity: 1 Low         Mabeline Caras, PT, DPT Acute Rehabilitation Services  Pager [phone] Office [place] 07/11/2019, 8:27 AM

## 2019-07-11 NOTE — Progress Notes (Signed)
OT Cancellation Note  Patient Details Name: Kim [name] MRN: [id number] DOB: July 15, 1955   Cancelled Treatment:    Reason Eval/Treat Not Completed: OT screened, no needs identified, will sign off(Pt reports energy conservation techniques and no OT needs)   Pt reports that she is well aware of energy conserving techniques- reports having shower chair, tall commodes and takes her time for lower body dressing. Education for crossing legs for figure 4 technique for bathing/dressing to reduce bending over. Pt reports "I do not need therapy at this time." OT signing off at this time. Pt denying need for OT eval. Per P.T., pt did very well.  Jefferey Pica, OTR/L Acute Rehabilitation Services Pager: [phone] Office: [phone]   Jefferey Pica 07/11/2019, 12:06 PM

## 2019-07-11 NOTE — Progress Notes (Signed)
Nutrition Brief Note  RD working remotely.  RD consulted for assessment of nutritional status/ needs per COPD GOLD protocol.   Wt Readings from Last 15 Encounters:  07/11/19 60.1 kg  07/01/19 60.5 kg  06/03/19 61.7 kg  05/02/19 63.1 kg  03/01/19 62.6 kg  02/11/19 63 kg  12/17/18 63 kg  08/09/18 59 kg  06/09/18 58.5 kg  05/14/18 59.9 kg  05/06/18 59.9 kg  03/19/18 57.6 kg  03/16/18 57.2 kg  01/14/18 56 kg  12/24/17 58 kg   Patient is a 64 y.o. female with history of COPD-on 2 L of oxygen at home as needed, history of Takotsubo cardiomyopathy who presented to the hospital with worsening shortness of breath for the past 2-3 weeks-thought to have COPD exacerbation and admitted to the hospitalist service.   Pt admitted with COPD exacerbation.   Attempted to speak with pt via phone, however, no answer.   Wt has been stable.   Current diet order is heart healthy/ carb modified, patient is consuming approximately n/a% of meals at this time. Labs and medications reviewed.   No nutrition interventions warranted at this time. If nutrition issues arise, please consult RD.   Loistine Chance, RD, LDN, [place] Registered Dietitian II Certified Diabetes Care and Education Specialist Please refer to [facility] for RD and/or RD on-call/weekend/after hours pager

## 2019-07-11 NOTE — ED Provider Notes (Signed)
[facility] EMERGENCY DEPARTMENT Provider Note   CSN: [id number] Arrival date & time: 07/10/19  1910     History Chief Complaint  Patient presents with  . Shortness of Breath    Kim [name] is a 64 y.o. female.  HPI   64 year old female with dyspnea.  Coming from home by EMS.  Called out for respiratory difficulties.  Diffuse wheezing noted by EMS on arrival.  Given 125 mg of Solu-Medrol, albuterol and 0.3 mg of epinephrine IM.  She was placed on a nonrebreather given her work of breathing although she was not sniffily hypoxic.  She states that she typically uses home oxygen on as-needed basis but in the last week or so she has been using it more frequently.  No fevers or chills.  No acute change in her cough.  No unusual swelling.  She has had some recent difficulties managing her blood pressure at home it sounds like it is pretty labile throughout the day.  Past Medical History:  Diagnosis Date  . C8 RADICULOPATHY 06/05/2009  . COPD 08/31/2007  . DEPRESSION 11/19/2006  . DYSLIPIDEMIA 03/12/2009  . MI (mitral incompetence)   . 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 OSTEOARTHRITIS 06/05/2009  . PARESTHESIA 08/24/2007  . TOBACCO ABUSE 01/25/2010    Patient Active Problem List   Diagnosis Date Noted  . COPD with acute exacerbation ([place]) 07/10/2019  . History of colonic polyps   . Benign neoplasm of ascending colon   . Neuritis 01/29/2017  . Alpha-1-antitrypsin deficiency carrier 01/19/2017  . Dependent edema 01/14/2017  . Upper airway cough syndrome 09/11/2016  . Allergic rhinitis 08/10/2015  . Chronic respiratory failure ([place]) 07/05/2015  . Hyperglycemia   . HLD (hyperlipidemia)   . Coronary artery calcification seen on CAT scan 07/02/2015  . Solitary pulmonary nodule 12/02/2013  . Loss of weight 11/10/2013  . Osteoarthritis 06/05/2009  . Brachial neuritis or radiculitis 06/05/2009  . Dyslipidemia 03/12/2009  . 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 COPD GOLD III   08/31/2007  . PARESTHESIA 08/24/2007  . Anxiety and depression 11/19/2006    Past Surgical History:  Procedure Laterality Date  . ABDOMINAL HYSTERECTOMY    . APPENDECTOMY  1969  . CARDIAC CATHETERIZATION N/A 07/05/2015   Procedure: Left Heart Cath and Coronary Angiography;  Surgeon: Leonie Man, MD;  Location: [facility];  Service: Cardiovascular;  Laterality: N/A;  . CHOLECYSTECTOMY  1989  . collapse lung  1990  . COLONOSCOPY WITH PROPOFOL N/A 05/14/2018   Procedure: COLONOSCOPY WITH PROPOFOL;  Surgeon: Irene Shipper, MD;  Location: [facility];  Service: Endoscopy;  Laterality: N/A;  . EXCISION MASS ABDOMINAL  2019  . EXPLORATORY LAPAROTOMY  1969  . POLYPECTOMY  05/14/2018   Procedure: POLYPECTOMY;  Surgeon: Irene Shipper, MD;  Location: [facility];  Service: Endoscopy;;  . SALPINGOOPHORECTOMY  2019     OB History   No obstetric history on file.     Family History  Problem Relation Age of Onset  . Cancer Mother        lung  . Heart attack Mother   . Hypertension Sister   . Multiple sclerosis Brother   . Diabetes Sister   . Rheum arthritis Sister   . Thyroid disease Sister   . Multiple sclerosis Other   . Alcohol abuse Other   . Arthritis Other   . Diabetes Other   . Kidney disease Other   . Cancer Other        lung,ovarian,skin, uterine  .  Stroke Other   . Heart disease Other   . Melanoma Other   . Heart attack Maternal Uncle   . Heart attack Other        NEICE  . Heart attack Other        NEPHEW  . Hypertension Brother   . Stroke Maternal Grandmother   . Colon cancer Neg Hx     Social History   Tobacco Use  . Smoking status: Former Smoker    Packs/day: 2.00    Years: 38.00    Pack years: 76.00    Types: Cigarettes    Quit date: 03/07/2014    Years since quitting: 5.3  . Smokeless tobacco: Never Used  Substance Use Topics  . Alcohol use: No    Alcohol/week: 0.0 standard drinks  . Drug use: No    Home Medications Prior to Admission  medications   Medication Sig Start Date End Date Taking? Authorizing Provider  albuterol (VENTOLIN HFA) 108 (90 Base) MCG/ACT inhaler Take two puffs every 4-6 hours as needed for shortness of breath/wheezing Patient taking differently: Inhale 2 puffs into the lungs every 6 (six) hours as needed for wheezing or shortness of breath.  06/03/19  Yes Martyn Ehrich, NP  ALPRAZolam Duanne Moron) 0.5 MG tablet TAKE 1/2 TO 1 TABLET BY MOUTH THREE TIMES DAILY Patient taking differently: Take 0.5 mg by mouth 3 (three) times daily.  12/28/18  Yes Nafziger, Tommi Rumps, NP  aspirin EC 81 MG EC tablet Take 1 tablet (81 mg total) by mouth daily. 07/08/15  Yes Nita Sells, MD  budesonide-formoterol (SYMBICORT) 160-4.5 MCG/ACT inhaler Inhale 2 puffs into the lungs 2 (two) times daily. 03/16/18  Yes [name], MD  Cholecalciferol (VITAMIN D3) 1000 units CAPS Take 1 capsule (1,000 Units total) by mouth 2 (two) times daily. 08/13/15  Yes Isaiah Serge, NP  furosemide (LASIX) 40 MG tablet Take 1 tablet (40 mg total) by mouth daily as needed (for weight gain of 3 lbs). 07/01/19  Yes Kathyrn Drown D, NP  HYDROcodone-acetaminophen (NORCO) 7.5-325 MG tablet Take 1 tablet by mouth every 6 (six) hours as needed for moderate pain. 03/08/19  Yes Nafziger, Tommi Rumps, NP  ipratropium-albuterol (DUONEB) 0.5-2.5 (3) MG/3ML SOLN Take 3 mLs by nebulization every 6 (six) hours as needed. Dx: J44.9 05/31/19  Yes [name], MD  losartan (COZAAR) 25 MG tablet Take 1 tablet (25 mg total) by mouth daily. 07/01/19  Yes Kathyrn Drown D, NP  nitroGLYCERIN (NITROSTAT) 0.4 MG SL tablet Place 1 tablet (0.4 mg total) under the tongue every 5 (five) minutes as needed for chest pain. 08/09/18  Yes Rosita Fire, Brittainy M, PA-C  PARoxetine (PAXIL CR) 25 MG 24 hr tablet Take 1 tablet (25 mg total) by mouth every morning. 03/29/19  Yes Nafziger, Tommi Rumps, NP  Tiotropium Bromide Monohydrate (SPIRIVA RESPIMAT) 2.5 MCG/ACT AERS USE 2 INHALATIONS BY MOUTH   DAILY Patient taking differently: Inhale 2 puffs into the lungs daily.  03/29/19  Yes Nafziger, Tommi Rumps, NP  vitamin B-12 (CYANOCOBALAMIN) 500 MCG tablet Take 500 mcg by mouth 2 (two) times daily.   Yes [provider]  Spacer/Aero Chamber Mouthpiece MISC 1 Device by Does not apply route as directed. 01/12/17   Javier Glazier, MD    Allergies    Ciprofloxacin, Lisinopril, and Penicillins  Review of Systems   Review of Systems All systems reviewed and negative, other than as noted in HPI. Physical Exam Updated Vital Signs BP (!) 106/50   Pulse 81  Temp 98.3 F (36.8 C) (Oral)   Resp (!) 31   Ht 5\' 3"  (1.6 m)   Wt 60.5 kg   SpO2 98%   BMI 23.63 kg/m   Physical Exam Vitals and nursing note reviewed.  Constitutional:      General: She is not in acute distress.    Appearance: She is well-developed.  HENT:     Head: Normocephalic and atraumatic.  Eyes:     General:        Right eye: No discharge.        Left eye: No discharge.     Conjunctiva/sclera: Conjunctivae normal.  Cardiovascular:     Rate and Rhythm: Normal rate and regular rhythm.     Heart sounds: Normal heart sounds. No murmur. No friction rub. No gallop.   Pulmonary:     Effort: Pulmonary effort is normal. No respiratory distress.     Breath sounds: Wheezing present.  Abdominal:     General: There is no distension.     Palpations: Abdomen is soft.     Tenderness: There is no abdominal tenderness.  Musculoskeletal:        General: No tenderness.     Cervical back: Neck supple.  Skin:    General: Skin is warm and dry.  Neurological:     Mental Status: She is alert.  Psychiatric:        Behavior: Behavior normal.        Thought Content: Thought content normal.     ED Results / Procedures / Treatments   Labs (all labs ordered are listed, but only abnormal results are displayed) Labs Reviewed  CBC WITH DIFFERENTIAL/PLATELET - Abnormal; Notable for the following components:      Result Value    WBC 14.1 (*)    Lymphs Abs 7.5 (*)    Monocytes Absolute 1.2 (*)    Eosinophils Absolute 0.9 (*)    Basophils Absolute 0.2 (*)    All other components within normal limits  COMPREHENSIVE METABOLIC PANEL - Abnormal; Notable for the following components:   Glucose, Bld 142 (*)    Total Protein 6.2 (*)    All other components within normal limits  TROPONIN I (HIGH SENSITIVITY) - Abnormal; Notable for the following components:   Troponin I (High Sensitivity) 29 (*)    All other components within normal limits  SARS CORONAVIRUS 2 (TAT 6-24 HRS)  BRAIN NATRIURETIC PEPTIDE  TROPONIN I (HIGH SENSITIVITY)    EKG EKG Interpretation  Date/Time:  Sunday July 10 2019 19:21:30 EDT Ventricular Rate:  97 PR Interval:    QRS Duration: 87 QT Interval:  349 QTC Calculation: 444 R Axis:   77 Text Interpretation: Sinus rhythm Borderline T abnormalities, lateral leads Confirmed by Virgel Manifold [phone]) on 07/10/2019 8:40:30 PM   Radiology DG Chest 2 View  Result Date: 07/10/2019 CLINICAL DATA:  Shortness of breath. History of COPD. Wheezing. EXAM: CHEST - 2 VIEW COMPARISON:  Chest x-ray dated 05/19/2019. FINDINGS: Heart size and mediastinal contours are within normal limits. Lungs are hyperexpanded. Lungs are clear. No pleural effusion or pneumothorax is seen. No acute or suspicious osseous finding. IMPRESSION: 1. No acute findings. No evidence of pneumonia or pulmonary edema. 2. COPD. Electronically Signed   By: Franki Cabot M.D.   On: 07/10/2019 20:08    Procedures Procedures (including critical care time)  Medications Ordered in ED Medications  ALPRAZolam (XANAX) tablet 0.5 mg (has no administration in time range)  albuterol (VENTOLIN HFA) 108 (90  Base) MCG/ACT inhaler 6 puff (6 puffs Inhalation Given 07/10/19 2056)    ED Course  I have reviewed the triage vital signs and the nursing notes.  Pertinent labs & imaging results that were available during my care of the patient were  reviewed by me and considered in my medical decision making (see chart for details).    MDM Rules/Calculators/A&P                     64 year old female with dyspnea.  Clinically COPD exacerbation.  Diffuse wheezing on exam.  Received steroids prehospital.  Continue bronchodilators.  Given degree of symptoms, will admit for ongoing management.  Chest x-ray without acute abnormality.  Doubt infectious.  Covid pending.  Final Clinical Impression(s) / ED Diagnoses Final diagnoses:  COPD exacerbation [facility])    Rx / DC Orders ED Discharge Orders    None       Virgel Manifold, MD 07/11/19 [phone]

## 2019-07-11 NOTE — Progress Notes (Signed)
SATURATION QUALIFICATIONS: (This note is used to comply with regulatory documentation for home oxygen)  Patient Saturations on Room Air at Rest = 88%  Patient Saturations on Room Air while Ambulating = 85%  Patient Saturations on 2 Liters of oxygen while Ambulating = 91%  Please briefly explain why patient needs home oxygen: Patient requires supplemental oxygen to maintain SpO2 >/88% on RA.  Mabeline Caras, PT, DPT Acute Rehabilitation Services  Pager [phone] Office [phone]

## 2019-07-12 NOTE — Progress Notes (Signed)
PROGRESS NOTE        PATIENT DETAILS Name: Kim [name] Age: 64 y.o. Sex: female Date of Birth: 19-Oct-1955 Admit Date: 07/10/2019 Admitting Physician Sid Falcon, MD [id number], Tommi Rumps, NP  Brief Narrative: Patient is a 64 y.o. female with history of COPD-on 2 L of oxygen at home as needed, history of Takotsubo cardiomyopathy who presented to the hospital with worsening shortness of breath for the past 2-3 weeks-thought to have COPD exacerbation and admitted to the hospitalist service.    Patient has had multiple episodes of cough/worsening shortness of breath since January 2021 and has completed several rounds of steroids and antimicrobial therapy.  Significant events: 4/4>> admit to [facility] for COPD exacerbation 4/5>> [id number], grade 1 diastolic dysfunction.  Antimicrobial therapy: None  Microbiology data: None  Procedures : None  Consults: None  DVT Prophylaxis : Prophylactic Lovenox   Subjective: Feeling better-still wheezing-still on 2 L of oxygen-although better she is not yet back to baseline.  Some coughing spells while I was in the room.  Assessment/Plan: COPD exacerbation: Improving-moving air better today-but still with significant wheezing all over.  She is not yet back to baseline.  Continue with IV steroids-bronchodilators and supportive care.  She is only on prn home-however is requiring at least 2 L to maintain her O2 saturations.  Chest x-ray without pneumonia.  Continue supportive care-encourage incentive spirometry and flutter valve.  Paroxysmal tachycardia/hypertension: Apparently has had these episodes for the past few weeks-especially at night.  Telemetry personally reviewed today-no arrhythmias.  Echocardiogram with preserved EF.  Further work-up including a sleep study deferred to the outpatient setting-patient agreeable with this plan.  Anxiety/depression: Appears stable-continue Xanax and Paxil  Chronic hypoxic  respiratory failure: On 2 L of oxygen as needed-currently stable/requiring on 2 L.  HTN: Stable with losartan  History of Takotsubo cardiomyopathy: Last echo on 2018-with preserved EF.  Volume status stable.  Diet: Diet Order            Diet heart healthy/carb modified Room service appropriate? Yes; Fluid consistency: Thin  Diet effective now               Code Status: Full code   Family Communication: Spouse was on speaker phone when I was seeing the patient earlier this morning.  All questions answered.  Disposition Plan: Home likely on 4/7 if clinically better.  Barriers to Discharge: COPD exacerbation requiring oxygen supplementation and IV steroids-patient not yet at baseline  Antimicrobial agents: Anti-infectives (From admission, onward)   None      Time spent: 25 minutes-Greater than 50% of this time was spent in counseling, explanation of diagnosis, planning of further management, and coordination of care.  MEDICATIONS: Scheduled Meds: . aspirin EC  81 mg Oral Daily  . cholecalciferol  1,000 Units Oral BID  . enoxaparin (LOVENOX) injection  40 mg Subcutaneous Q24H  . ipratropium-albuterol  3 mL Nebulization TID  . losartan  25 mg Oral Daily  . methylPREDNISolone (SOLU-MEDROL) injection  40 mg Intravenous Q12H  . mometasone-formoterol  2 puff Inhalation BID  . PARoxetine  25 mg Oral q morning - 10a  . sodium chloride flush  3 mL Intravenous Q12H  . umeclidinium bromide  1 puff Inhalation Daily  . vitamin B-12  500 mcg Oral BID   Continuous Infusions: PRN Meds:.albuterol, ALPRAZolam, furosemide, HYDROcodone-acetaminophen, nitroGLYCERIN   PHYSICAL  EXAM: Vital signs: Vitals:   07/11/19 1622 07/11/19 1924 07/12/19 0356 07/12/19 0859  BP: 120/79 132/65 (!) 163/80   Pulse: (!) 111 97 94 91  Resp: 18 18 20 18   Temp: 98.4 F (36.9 C) 98.3 F (36.8 C) 97.9 F (36.6 C)   TempSrc: Oral Oral Oral   SpO2: 93% 95% 93% 95%  Weight:   60.1 kg   Height:         Filed Weights   07/10/19 1917 07/11/19 0329 07/12/19 0356  Weight: 60.5 kg 60.1 kg 60.1 kg   Body mass index is 23.47 kg/m.   Gen Exam:Alert awake-not in any distress HEENT:atraumatic, normocephalic Chest: Bilateral rhonchi present CVS:S1S2 regular Abdomen:soft non tender, non distended Extremities:no edema Neurology: Non focal Skin: no rash  I have personally reviewed following labs and imaging studies  LABORATORY DATA: CBC: Recent Labs  Lab 07/10/19 1915  WBC 14.1*  NEUTROABS 4.3  HGB 13.9  HCT 43.7  MCV 98.0  PLT 99991111    Basic Metabolic Panel: Recent Labs  Lab 07/10/19 1915  NA 142  K 4.0  CL 105  CO2 27  GLUCOSE 142*  BUN 12  CREATININE 0.79  CALCIUM 9.6    GFR: Estimated Creatinine Clearance: 58 mL/min (by C-G formula based on SCr of 0.79 mg/dL).  Liver Function Tests: Recent Labs  Lab 07/10/19 1915  AST 17  ALT 14  ALKPHOS 72  BILITOT 0.4  PROT 6.2*  ALBUMIN 3.7   No results for input(s): LIPASE, AMYLASE in the last 168 hours. No results for input(s): AMMONIA in the last 168 hours.  Coagulation Profile: No results for input(s): INR, PROTIME in the last 168 hours.  Cardiac Enzymes: No results for input(s): CKTOTAL, CKMB, CKMBINDEX, TROPONINI in the last 168 hours.  BNP (last 3 results) No results for input(s): PROBNP in the last 8760 hours.  Lipid Profile: No results for input(s): CHOL, HDL, LDLCALC, TRIG, CHOLHDL, LDLDIRECT in the last 72 hours.  Thyroid Function Tests: No results for input(s): TSH, T4TOTAL, FREET4, T3FREE, THYROIDAB in the last 72 hours.  Anemia Panel: No results for input(s): VITAMINB12, FOLATE, FERRITIN, TIBC, IRON, RETICCTPCT in the last 72 hours.  Urine analysis:    Component Value Date/Time   COLORURINE yellow 02/26/2010 0951   APPEARANCEUR Clear 02/26/2010 0951   LABSPEC 1.025 02/26/2010 0951   PHURINE 5.0 02/26/2010 0951   HGBUR trace-lysed 02/26/2010 0951   BILIRUBINUR Negative 01/14/2018 0859    PROTEINUR Negative 01/14/2018 0859   UROBILINOGEN 0.2 01/14/2018 0859   UROBILINOGEN 0.2 02/26/2010 0951   NITRITE Negative 01/14/2018 0859   NITRITE negative 02/26/2010 0951   LEUKOCYTESUR Negative 01/14/2018 0859    Sepsis Labs: Lactic Acid, Venous    Component Value Date/Time   LATICACIDVEN 1.8 12/03/2016 [id number]    MICROBIOLOGY: Recent Results (from the past 240 hour(s))  SARS CORONAVIRUS 2 (TAT 6-24 HRS) Nasopharyngeal Nasopharyngeal Swab     Status: None   Collection Time: 07/10/19  8:50 PM   Specimen: Nasopharyngeal Swab  Result Value Ref Range Status   SARS Coronavirus 2 NEGATIVE NEGATIVE Final    Comment: (NOTE) SARS-CoV-2 target nucleic acids are NOT DETECTED. The SARS-CoV-2 RNA is generally detectable in upper and lower respiratory specimens during the acute phase of infection. Negative results do not preclude SARS-CoV-2 infection, do not rule out co-infections with other pathogens, and should not be used as the sole basis for treatment or other patient management decisions. Negative results must be combined with clinical observations,  patient history, and epidemiological information. The expected result is Negative. Fact Sheet for Patients: SugarRoll.be Fact Sheet for Healthcare Providers: https://www.woods-mathews.com/ This test is not yet approved or cleared by the [place] FDA and  has been authorized for detection and/or diagnosis of SARS-CoV-2 by FDA under an Emergency Use Authorization (EUA). This EUA will remain  in effect (meaning this test can be used) for the duration of the COVID-19 declaration under Section 56 4(b)(1) of the Act, 21 U.S.C. section 360bbb-3(b)(1), unless the authorization is terminated or revoked sooner. Performed at [facility], [place] [address] [place], [place] [address]     RADIOLOGY STUDIES/RESULTS: DG Chest 2 View  Result Date: 07/10/2019 CLINICAL DATA:  Shortness of  breath. History of COPD. Wheezing. EXAM: CHEST - 2 VIEW COMPARISON:  Chest x-ray dated 05/19/2019. FINDINGS: Heart size and mediastinal contours are within normal limits. Lungs are hyperexpanded. Lungs are clear. No pleural effusion or pneumothorax is seen. No acute or suspicious osseous finding. IMPRESSION: 1. No acute findings. No evidence of pneumonia or pulmonary edema. 2. COPD. Electronically Signed   By: Franki Cabot M.D.   On: 07/10/2019 20:08   ECHOCARDIOGRAM COMPLETE  Result Date: 07/11/2019    ECHOCARDIOGRAM REPORT   Patient Name:   Kim [name] Date of Exam: 07/11/2019 Medical Rec #:  [id number]       Height:       63.0 in Accession #:    [id number]      Weight:       132.4 lb Date of Birth:  1956/02/17      BSA:          1.623 m Patient Age:    79 years        BP:           113/72 mmHg Patient Gender: F               HR:           93 bpm. Exam Location:  Inpatient Procedure: 2D Echo Indications:    acute respiratory insufficiency 518.82  History:        Patient has prior history of Echocardiogram examinations, most                 recent 09/03/2016. COPD; Risk Factors:Dyslipidemia.  Sonographer:    Johny Chess Referring Phys: [place]  1. Left ventricular ejection fraction, by estimation, is 60 to 65%. The left ventricle has normal function. The left ventricle has no regional wall motion abnormalities. Left ventricular diastolic parameters are consistent with Grade I diastolic dysfunction (impaired relaxation).  2. Right ventricular systolic function is normal. The right ventricular size is normal. Tricuspid regurgitation signal is inadequate for assessing PA pressure.  3. The mitral valve is grossly normal. No evidence of mitral valve regurgitation. No evidence of mitral stenosis.  4. The aortic valve is tricuspid. Aortic valve regurgitation is mild. Mild aortic valve sclerosis is present, with no evidence of aortic valve stenosis.  5. The inferior vena cava is normal in  size with greater than 50% respiratory variability, suggesting right atrial pressure of 3 mmHg. Comparison(s): A prior study was performed on 09/03/2016. No significant change from prior study. FINDINGS  Left Ventricle: Left ventricular ejection fraction, by estimation, is 60 to 65%. The left ventricle has normal function. The left ventricle has no regional wall motion abnormalities. The left ventricular internal cavity size was normal in size. There is  no left ventricular  hypertrophy. Left ventricular diastolic parameters are consistent with Grade I diastolic dysfunction (impaired relaxation). Normal left ventricular filling pressure. Right Ventricle: The right ventricular size is normal. No increase in right ventricular wall thickness. Right ventricular systolic function is normal. Tricuspid regurgitation signal is inadequate for assessing PA pressure. Left Atrium: Left atrial size was normal in size. Right Atrium: Right atrial size was normal in size. Pericardium: Trivial pericardial effusion is present. Presence of pericardial fat pad. Mitral Valve: The mitral valve is grossly normal. No evidence of mitral valve regurgitation. No evidence of mitral valve stenosis. Tricuspid Valve: The tricuspid valve is grossly normal. Tricuspid valve regurgitation is not demonstrated. No evidence of tricuspid stenosis. Aortic Valve: The aortic valve is tricuspid. Aortic valve regurgitation is mild. Aortic regurgitation PHT measures 367 msec. Mild aortic valve sclerosis is present, with no evidence of aortic valve stenosis. There is mild calcification of the aortic valve. Pulmonic Valve: The pulmonic valve was grossly normal. Pulmonic valve regurgitation is not visualized. No evidence of pulmonic stenosis. Aorta: The aortic root is normal in size and structure. Venous: The inferior vena cava is normal in size with greater than 50% respiratory variability, suggesting right atrial pressure of 3 mmHg. IAS/Shunts: The atrial septum  is grossly normal.  LEFT VENTRICLE PLAX 2D LVIDd:         4.20 cm  Diastology LVIDs:         3.00 cm  LV e' lateral:   8.92 cm/s LV PW:         0.80 cm  LV E/e' lateral: 7.1 LV IVS:        0.50 cm  LV e' medial:    8.59 cm/s LVOT diam:     1.80 cm  LV E/e' medial:  7.4 LV SV:         52 LV SV Index:   32 LVOT Area:     2.54 cm  RIGHT VENTRICLE RV S prime:     12.50 cm/s TAPSE (M-mode): 1.7 cm LEFT ATRIUM             Index       RIGHT ATRIUM          Index LA diam:        2.00 cm 1.23 cm/m  RA Area:     8.32 cm LA Vol (A2C):   23.4 ml 14.42 ml/m RA Volume:   14.90 ml 9.18 ml/m LA Vol (A4C):   20.0 ml 12.33 ml/m LA Biplane Vol: 22.0 ml 13.56 ml/m  AORTIC VALVE LVOT Vmax:   125.00 cm/s LVOT Vmean:  78.700 cm/s LVOT VTI:    0.204 m AI PHT:      367 msec  AORTA Ao Root diam: 2.70 cm MITRAL VALVE MV Area (PHT): 4.10 cm    SHUNTS MV Decel Time: 185 msec    Systemic VTI:  0.20 m MV E velocity: 63.70 cm/s  Systemic Diam: 1.80 cm MV A velocity: 83.90 cm/s MV E/A ratio:  0.76 Eleonore Chiquito MD Electronically signed by Eleonore Chiquito MD Signature Date/Time: 07/11/2019/2:45:27 PM    Final      LOS: 2 days   Oren Binet, MD  [facility]    To contact the attending provider between 7A-7P or the covering provider during after hours 7P-7A, please log into the web site [email or web address] and access using universal [place] password for that web site. If you do not have the password, please call the hospital operator.  07/12/2019, 9:50 AM

## 2019-07-12 NOTE — Progress Notes (Signed)
Received report. Pt resting in bed with eyes closed. No apparent distress noted at this time.

## 2019-07-13 DIAGNOSIS — J449 Chronic obstructive pulmonary disease, unspecified: Secondary | ICD-10-CM

## 2019-07-13 DIAGNOSIS — M159 Polyosteoarthritis, unspecified: Secondary | ICD-10-CM

## 2019-07-13 LAB — CBC
HCT: 42.5 % (ref 36.0–46.0)
Hemoglobin: 13.4 g/dL (ref 12.0–15.0)
MCH: 31.1 pg (ref 26.0–34.0)
MCHC: 31.5 g/dL (ref 30.0–36.0)
MCV: 98.6 fL (ref 80.0–100.0)
Platelets: 271 10*3/uL (ref 150–400)
RBC: 4.31 MIL/uL (ref 3.87–5.11)
RDW: 13.6 % (ref 11.5–15.5)
WBC: 14.3 10*3/uL — ABNORMAL HIGH (ref 4.0–10.5)
nRBC: 0 % (ref 0.0–0.2)

## 2019-07-13 LAB — BASIC METABOLIC PANEL
Anion gap: 12 (ref 5–15)
BUN: 19 mg/dL (ref 8–23)
CO2: 26 mmol/L (ref 22–32)
Calcium: 9.9 mg/dL (ref 8.9–10.3)
Chloride: 104 mmol/L (ref 98–111)
Creatinine, Ser: 0.87 mg/dL (ref 0.44–1.00)
GFR calc Af Amer: >60 mL/min (ref >60)
GFR calc non Af Amer: >60 mL/min (ref >60)
Glucose, Bld: 154 mg/dL — ABNORMAL HIGH (ref 70–99)
Potassium: 5 mmol/L (ref 3.5–5.1)
Sodium: 142 mmol/L (ref 135–145)

## 2019-07-13 MED ORDER — PREDNISONE 10 MG PO TABS: ORAL_TABLET | ORAL | 0 refills | Status: AC

## 2019-07-13 NOTE — Discharge Summary (Signed)
Physician Discharge Summary  Kim [name] [id number] DOB: 03-01-1956 DOA: 07/10/2019  PCP: [name], NP  Admit date: 07/10/2019 Discharge date: 07/13/2019  Admitted From: Home Disposition:  Home  Recommendations for Outpatient Follow-up:  1. Follow up with PCP in 1-2 weeks 2. Follow-up with pulmonology, Dr. Elsworth Soho as scheduled 3. Discharge home on prednisone taper for COPD exacerbation  Home Health: No Equipment/Devices: Supplemental oxygen, 2 L nasal cannula  Discharge Condition: Stable CODE STATUS: Full code Diet recommendation: Regular diet  History of present illness:  Kim [name] is a 64 y.o. female with history of COPD on 2 L of oxygen at home as needed, history of Takotsubo cardiomyopathy who presented to the hospital with worsening shortness of breath for the past 2-3 weeks-thought to have COPD exacerbation and admitted to the hospitalist service.    Patient has had multiple episodes of cough/worsening shortness of breath since January 2021 and has completed several rounds of steroids and antimicrobial therapy.   Hospital course:  COPD exacerbation: Patient presenting with progressive shortness of breath over the past 2-3 weeks.  Chest x-ray with no acute cardiopulmonary disease process.  Patient was started on IV steroids, duo nebs.  Patient symptoms progressively improved and IV steroids were weaned down.  Patient now feels back close to her normal baseline, still requiring 2 L nasal cannula which she does utilize at home.  Will discharge home on a steroid taper over the next 12 days.  Recommend that she follows up with her primary pulmonologist, Dr. Elsworth Soho in the next 1-2 weeks.  Continue home Symbicort and Spiriva.  Paroxysmal tachycardia/hypertension:  Apparently has had these episodes for the past few weeks, mostly at night.  Telemetry reviewed during hospitalization with no arrhythmias appreciated.  TTE with preserved EF.  May consider outpatient sleep study  for further evaluation.  Anxiety/depression: continue Xanax and Paxil  Chronic hypoxic respiratory failure: On 2 L of oxygen as needed  HTN:  Continue losartan  History of Takotsubo cardiomyopathy: TTE 07/11/2019 EF 60-65%, no LV wall motion abnormalities, grade 1 diastolic dysfunction.  Volume status stable.  Discharge Diagnoses:  Active Problems:   Anxiety and depression   COPD GOLD III    Osteoarthritis   Chronic respiratory failure (HCC)   COPD with acute exacerbation [facility])    Discharge Instructions  Discharge Instructions    Call MD for:  difficulty breathing, headache or visual disturbances   Complete by: As directed    Call MD for:  extreme fatigue   Complete by: As directed    Call MD for:  persistant dizziness or light-headedness   Complete by: As directed    Call MD for:  persistant nausea and vomiting   Complete by: As directed    Call MD for:  severe uncontrolled pain   Complete by: As directed    Call MD for:  temperature >100.4   Complete by: As directed    Diet - low sodium heart healthy   Complete by: As directed    Increase activity slowly   Complete by: As directed      Allergies as of 07/13/2019      Reactions   Ciprofloxacin    Hallucinations    Lisinopril    cough   Penicillins Hives   Has patient had a PCN reaction causing immediate rash, facial/tongue/throat swelling, SOB or lightheadedness with hypotension: Yes Has patient had a PCN reaction causing severe rash involving mucus membranes or skin necrosis: No Has patient had a PCN  reaction that required hospitalization No Has patient had a PCN reaction occurring within the last 10 years: No If all of the above answers are "NO", then may proceed with Cephalosporin use.      Medication List    TAKE these medications   albuterol 108 (90 Base) MCG/ACT inhaler Commonly known as: VENTOLIN HFA Take two puffs every 4-6 hours as needed for shortness of breath/wheezing What changed:   how  much to take  how to take this  when to take this  reasons to take this  additional instructions   ALPRAZolam 0.5 MG tablet Commonly known as: XANAX TAKE 1/2 TO 1 TABLET BY MOUTH THREE TIMES DAILY What changed:   how much to take  how to take this  when to take this  additional instructions   aspirin 81 MG EC tablet Take 1 tablet (81 mg total) by mouth daily.   budesonide-formoterol 160-4.5 MCG/ACT inhaler Commonly known as: Symbicort Inhale 2 puffs into the lungs 2 (two) times daily.   furosemide 40 MG tablet Commonly known as: LASIX Take 1 tablet (40 mg total) by mouth daily as needed (for weight gain of 3 lbs).   HYDROcodone-acetaminophen 7.5-325 MG tablet Commonly known as: Norco Take 1 tablet by mouth every 6 (six) hours as needed for moderate pain.   ipratropium-albuterol 0.5-2.5 (3) MG/3ML Soln Commonly known as: DUONEB Take 3 mLs by nebulization every 6 (six) hours as needed. Dx: J44.9   losartan 25 MG tablet Commonly known as: COZAAR Take 1 tablet (25 mg total) by mouth daily.   nitroGLYCERIN 0.4 MG SL tablet Commonly known as: NITROSTAT Place 1 tablet (0.4 mg total) under the tongue every 5 (five) minutes as needed for chest pain.   PARoxetine 25 MG 24 hr tablet Commonly known as: Paxil CR Take 1 tablet (25 mg total) by mouth every morning.   predniSONE 10 MG tablet Commonly known as: DELTASONE Take 6 tablets (60 mg total) by mouth daily for 2 days, THEN 5 tablets (50 mg total) daily for 2 days, THEN 4 tablets (40 mg total) daily for 2 days, THEN 3 tablets (30 mg total) daily for 2 days, THEN 2 tablets (20 mg total) daily for 2 days, THEN 1 tablet (10 mg total) daily for 2 days. Start taking on: July 13, 2019   Spacer/Aero Chamber Mouthpiece Misc 1 Device by Does not apply route as directed.   Spiriva Respimat 2.5 MCG/ACT Aers Generic drug: Tiotropium Bromide Monohydrate USE 2 INHALATIONS BY MOUTH  DAILY What changed:   how much to  take  how to take this  when to take this  additional instructions   vitamin B-12 500 MCG tablet Commonly known as: CYANOCOBALAMIN Take 500 mcg by mouth 2 (two) times daily.   Vitamin D3 25 MCG (1000 UT) Caps Take 1 capsule (1,000 Units total) by mouth 2 (two) times daily.      Follow-up Information    [name], NP. Call in 1 week(s).   Specialty: Family Medicine Contact information: [address] [place] [address] [phone]        [name], MD .   Specialty: Cardiology Contact information: [address] [place] [address] [phone]          Allergies  Allergen Reactions  . Ciprofloxacin     Hallucinations   . Lisinopril     cough  . Penicillins Hives    Has patient had a PCN reaction causing immediate  rash, facial/tongue/throat swelling, SOB or lightheadedness with hypotension: Yes Has patient had a PCN reaction causing severe rash involving mucus membranes or skin necrosis: No Has patient had a PCN reaction that required hospitalization No Has patient had a PCN reaction occurring within the last 10 years: No If all of the above answers are "NO", then may proceed with Cephalosporin use.     Consultations:  None   Procedures/Studies: DG Chest 2 View  Result Date: 07/10/2019 CLINICAL DATA:  Shortness of breath. History of COPD. Wheezing. EXAM: CHEST - 2 VIEW COMPARISON:  Chest x-ray dated 05/19/2019. FINDINGS: Heart size and mediastinal contours are within normal limits. Lungs are hyperexpanded. Lungs are clear. No pleural effusion or pneumothorax is seen. No acute or suspicious osseous finding. IMPRESSION: 1. No acute findings. No evidence of pneumonia or pulmonary edema. 2. COPD. Electronically Signed   By: Franki Cabot M.D.   On: 07/10/2019 20:08   ECHOCARDIOGRAM COMPLETE  Result Date: 07/11/2019    ECHOCARDIOGRAM REPORT   Patient Name:   BURNETTE ANTONE Date of Exam: 07/11/2019 Medical Rec #:   [id number]       Height:       63.0 in Accession #:    [id number]      Weight:       132.4 lb Date of Birth:  08-21-1955      BSA:          1.623 m Patient Age:    64 years        BP:           113/72 mmHg Patient Gender: F               HR:           93 bpm. Exam Location:  Inpatient Procedure: 2D Echo Indications:    acute respiratory insufficiency 518.82  History:        Patient has prior history of Echocardiogram examinations, most                 recent 09/03/2016. COPD; Risk Factors:Dyslipidemia.  Sonographer:    Johny Chess Referring Phys: [place]  1. Left ventricular ejection fraction, by estimation, is 60 to 65%. The left ventricle has normal function. The left ventricle has no regional wall motion abnormalities. Left ventricular diastolic parameters are consistent with Grade I diastolic dysfunction (impaired relaxation).  2. Right ventricular systolic function is normal. The right ventricular size is normal. Tricuspid regurgitation signal is inadequate for assessing PA pressure.  3. The mitral valve is grossly normal. No evidence of mitral valve regurgitation. No evidence of mitral stenosis.  4. The aortic valve is tricuspid. Aortic valve regurgitation is mild. Mild aortic valve sclerosis is present, with no evidence of aortic valve stenosis.  5. The inferior vena cava is normal in size with greater than 50% respiratory variability, suggesting right atrial pressure of 3 mmHg. Comparison(s): A prior study was performed on 09/03/2016. No significant change from prior study. FINDINGS  Left Ventricle: Left ventricular ejection fraction, by estimation, is 60 to 65%. The left ventricle has normal function. The left ventricle has no regional wall motion abnormalities. The left ventricular internal cavity size was normal in size. There is  no left ventricular hypertrophy. Left ventricular diastolic parameters are consistent with Grade I diastolic dysfunction (impaired relaxation).  Normal left ventricular filling pressure. Right Ventricle: The right ventricular size is normal. No increase in right ventricular wall thickness. Right ventricular systolic function is  normal. Tricuspid regurgitation signal is inadequate for assessing PA pressure. Left Atrium: Left atrial size was normal in size. Right Atrium: Right atrial size was normal in size. Pericardium: Trivial pericardial effusion is present. Presence of pericardial fat pad. Mitral Valve: The mitral valve is grossly normal. No evidence of mitral valve regurgitation. No evidence of mitral valve stenosis. Tricuspid Valve: The tricuspid valve is grossly normal. Tricuspid valve regurgitation is not demonstrated. No evidence of tricuspid stenosis. Aortic Valve: The aortic valve is tricuspid. Aortic valve regurgitation is mild. Aortic regurgitation PHT measures 367 msec. Mild aortic valve sclerosis is present, with no evidence of aortic valve stenosis. There is mild calcification of the aortic valve. Pulmonic Valve: The pulmonic valve was grossly normal. Pulmonic valve regurgitation is not visualized. No evidence of pulmonic stenosis. Aorta: The aortic root is normal in size and structure. Venous: The inferior vena cava is normal in size with greater than 50% respiratory variability, suggesting right atrial pressure of 3 mmHg. IAS/Shunts: The atrial septum is grossly normal.  LEFT VENTRICLE PLAX 2D LVIDd:         4.20 cm  Diastology LVIDs:         3.00 cm  LV e' lateral:   8.92 cm/s LV PW:         0.80 cm  LV E/e' lateral: 7.1 LV IVS:        0.50 cm  LV e' medial:    8.59 cm/s LVOT diam:     1.80 cm  LV E/e' medial:  7.4 LV SV:         52 LV SV Index:   32 LVOT Area:     2.54 cm  RIGHT VENTRICLE RV S prime:     12.50 cm/s TAPSE (M-mode): 1.7 cm LEFT ATRIUM             Index       RIGHT ATRIUM          Index LA diam:        2.00 cm 1.23 cm/m  RA Area:     8.32 cm LA Vol (A2C):   23.4 ml 14.42 ml/m RA Volume:   14.90 ml 9.18 ml/m LA Vol (A4C):    20.0 ml 12.33 ml/m LA Biplane Vol: 22.0 ml 13.56 ml/m  AORTIC VALVE LVOT Vmax:   125.00 cm/s LVOT Vmean:  78.700 cm/s LVOT VTI:    0.204 m AI PHT:      367 msec  AORTA Ao Root diam: 2.70 cm MITRAL VALVE MV Area (PHT): 4.10 cm    SHUNTS MV Decel Time: 185 msec    Systemic VTI:  0.20 m MV E velocity: 63.70 cm/s  Systemic Diam: 1.80 cm MV A velocity: 83.90 cm/s MV E/A ratio:  0.76 Eleonore Chiquito MD Electronically signed by Eleonore Chiquito MD Signature Date/Time: 07/11/2019/2:45:27 PM    Final       Subjective: Patient seen and examined bedside, resting comfortably.  Spouse updated via telephone in room.  Patient feels shortness of breath has remarkably improved and feels close to her normal baseline.  Continues with some mild wheezing.  Ready for discharge home.  Denies headache, no fever/chills/night sweats, no nausea/vomiting/diarrhea, no chest pain, no palpitations, no abdominal pain, no weakness, no fatigue.  No acute events overnight per nursing staff.   Discharge Exam: Vitals:   07/13/19 0856 07/13/19 0918  BP:  (!) 144/68  Pulse:  77  Resp:  16  Temp:  98.2 F (36.8 C)  SpO2:  93% 95%   Vitals:   07/13/19 0349 07/13/19 0351 07/13/19 0856 07/13/19 0918  BP: (!) 150/71   (!) 144/68  Pulse: 81   77  Resp: 18   16  Temp: 97.7 F (36.5 C)   98.2 F (36.8 C)  TempSrc: Oral   Oral  SpO2: 95%  93% 95%  Weight:  59.7 kg    Height:        General: Pt is alert, awake, not in acute distress Cardiovascular: RRR, S1/S2 +, no rubs, no gallops Respiratory: Mild diffuse late expiratory wheezing bilaterally, no rhonchi/crackles, normal respiratory effort, on 2 L nasal cannula Abdominal: Soft, NT, ND, bowel sounds + Extremities: no edema, no cyanosis    The results of significant diagnostics from this hospitalization (including imaging, microbiology, ancillary and laboratory) are listed below for reference.     Microbiology: Recent Results (from the past 240 hour(s))  SARS CORONAVIRUS 2  (TAT 6-24 HRS) Nasopharyngeal Nasopharyngeal Swab     Status: None   Collection Time: 07/10/19  8:50 PM   Specimen: Nasopharyngeal Swab  Result Value Ref Range Status   SARS Coronavirus 2 NEGATIVE NEGATIVE Final    Comment: (NOTE) SARS-CoV-2 target nucleic acids are NOT DETECTED. The SARS-CoV-2 RNA is generally detectable in upper and lower respiratory specimens during the acute phase of infection. Negative results do not preclude SARS-CoV-2 infection, do not rule out co-infections with other pathogens, and should not be used as the sole basis for treatment or other patient management decisions. Negative results must be combined with clinical observations, patient history, and epidemiological information. The expected result is Negative. Fact Sheet for Patients: SugarRoll.be Fact Sheet for Healthcare Providers: https://www.woods-mathews.com/ This test is not yet approved or cleared by the [place] FDA and  has been authorized for detection and/or diagnosis of SARS-CoV-2 by FDA under an Emergency Use Authorization (EUA). This EUA will remain  in effect (meaning this test can be used) for the duration of the COVID-19 declaration under Section 56 4(b)(1) of the Act, 21 U.S.C. section 360bbb-3(b)(1), unless the authorization is terminated or revoked sooner. Performed at [facility], [place] [address] [place], [place] [address]      Labs: BNP (last 3 results) Recent Labs    07/10/19 1915  BNP [id number]   Basic Metabolic Panel: Recent Labs  Lab 07/10/19 1915 07/13/19 0403  NA 142 142  K 4.0 5.0  CL 105 104  CO2 27 26  GLUCOSE 142* 154*  BUN 12 19  CREATININE 0.79 0.87  CALCIUM 9.6 9.9   Liver Function Tests: Recent Labs  Lab 07/10/19 1915  AST 17  ALT 14  ALKPHOS 72  BILITOT 0.4  PROT 6.2*  ALBUMIN 3.7   No results for input(s): LIPASE, AMYLASE in the last 168 hours. No results for input(s): AMMONIA in the last 168  hours. CBC: Recent Labs  Lab 07/10/19 1915 07/13/19 0403  WBC 14.1* 14.3*  NEUTROABS 4.3  --   HGB 13.9 13.4  HCT 43.7 42.5  MCV 98.0 98.6  PLT 245 271   Cardiac Enzymes: No results for input(s): CKTOTAL, CKMB, CKMBINDEX, TROPONINI in the last 168 hours. BNP: Invalid input(s): POCBNP CBG: No results for input(s): GLUCAP in the last 168 hours. D-Dimer No results for input(s): DDIMER in the last 72 hours. Hgb A1c No results for input(s): HGBA1C in the last 72 hours. Lipid Profile No results for input(s): CHOL, HDL, LDLCALC, TRIG, CHOLHDL, LDLDIRECT in the last 72 hours. Thyroid function  studies No results for input(s): TSH, T4TOTAL, T3FREE, THYROIDAB in the last 72 hours.  Invalid input(s): FREET3 Anemia work up No results for input(s): VITAMINB12, FOLATE, FERRITIN, TIBC, IRON, RETICCTPCT in the last 72 hours. Urinalysis    Component Value Date/Time   COLORURINE yellow 02/26/2010 0951   APPEARANCEUR Clear 02/26/2010 0951   LABSPEC 1.025 02/26/2010 0951   PHURINE 5.0 02/26/2010 0951   HGBUR trace-lysed 02/26/2010 0951   BILIRUBINUR Negative 01/14/2018 0859   PROTEINUR Negative 01/14/2018 0859   UROBILINOGEN 0.2 01/14/2018 0859   UROBILINOGEN 0.2 02/26/2010 0951   NITRITE Negative 01/14/2018 0859   NITRITE negative 02/26/2010 0951   LEUKOCYTESUR Negative 01/14/2018 0859   Sepsis Labs Invalid input(s): PROCALCITONIN,  WBC,  LACTICIDVEN Microbiology Recent Results (from the past 240 hour(s))  SARS CORONAVIRUS 2 (TAT 6-24 HRS) Nasopharyngeal Nasopharyngeal Swab     Status: None   Collection Time: 07/10/19  8:50 PM   Specimen: Nasopharyngeal Swab  Result Value Ref Range Status   SARS Coronavirus 2 NEGATIVE NEGATIVE Final    Comment: (NOTE) SARS-CoV-2 target nucleic acids are NOT DETECTED. The SARS-CoV-2 RNA is generally detectable in upper and lower respiratory specimens during the acute phase of infection. Negative results do not preclude SARS-CoV-2 infection, do  not rule out co-infections with other pathogens, and should not be used as the sole basis for treatment or other patient management decisions. Negative results must be combined with clinical observations, patient history, and epidemiological information. The expected result is Negative. Fact Sheet for Patients: SugarRoll.be Fact Sheet for Healthcare Providers: https://www.woods-mathews.com/ This test is not yet approved or cleared by the [place] FDA and  has been authorized for detection and/or diagnosis of SARS-CoV-2 by FDA under an Emergency Use Authorization (EUA). This EUA will remain  in effect (meaning this test can be used) for the duration of the COVID-19 declaration under Section 56 4(b)(1) of the Act, 21 U.S.C. section 360bbb-3(b)(1), unless the authorization is terminated or revoked sooner. Performed at [facility], [place] [address] [place], [place] [address]      Time coordinating discharge: Over 30 minutes  SIGNED:   Cristy Colmenares J [place] DO  [facility] 07/13/2019, 11:17 AM

## 2019-07-13 NOTE — Discharge Instructions (Signed)
Chronic Obstructive Pulmonary Disease Chronic obstructive pulmonary disease (COPD) is a long-term (chronic) lung problem. When you have COPD, it is hard for air to get in and out of your lungs. Usually the condition gets worse over time, and your lungs will never return to normal. There are things you can do to keep yourself as healthy as possible.  Your doctor may treat your condition with: ? Medicines. ? Oxygen. ? Lung surgery.  Your doctor may also recommend: ? Rehabilitation. This includes steps to make your body work better. It may involve a team of specialists. ? Quitting smoking, if you smoke. ? Exercise and changes to your diet. ? Comfort measures (palliative care). Follow these instructions at home: Medicines  Take over-the-counter and prescription medicines only as told by your doctor.  Talk to your doctor before taking any cough or allergy medicines. You may need to avoid medicines that cause your lungs to be dry. Lifestyle  If you smoke, stop. Smoking makes the problem worse. If you need help quitting, ask your doctor.  Avoid being around things that make your breathing worse. This may include smoke, chemicals, and fumes.  Stay active, but remember to rest as well.  Learn and use tips on how to relax.  Make sure you get enough sleep. Most adults need at least 7 hours of sleep every night.  Eat healthy foods. Eat smaller meals more often. Rest before meals. Controlled breathing Learn and use tips on how to control your breathing as told by your doctor. Try:  Breathing in (inhaling) through your nose for 1 second. Then, pucker your lips and breath out (exhale) through your lips for 2 seconds.  Putting one hand on your belly (abdomen). Breathe in slowly through your nose for 1 second. Your hand on your belly should move out. Pucker your lips and breathe out slowly through your lips. Your hand on your belly should move in as you breathe out.  Controlled coughing Learn  and use controlled coughing to clear mucus from your lungs. Follow these steps: 1. Lean your head a little forward. 2. Breathe in deeply. 3. Try to hold your breath for 3 seconds. 4. Keep your mouth slightly open while coughing 2 times. 5. Spit any mucus out into a tissue. 6. Rest and do the steps again 1 or 2 times as needed. General instructions  Make sure you get all the shots (vaccines) that your doctor recommends. Ask your doctor about a flu shot and a pneumonia shot.  Use oxygen therapy and pulmonary rehabilitation if told by your doctor. If you need home oxygen therapy, ask your doctor if you should buy a tool to measure your oxygen level (oximeter).  Make a COPD action plan with your doctor. This helps you to know what to do if you feel worse than usual.  Manage any other conditions you have as told by your doctor.  Avoid going outside when it is very hot, cold, or humid.  Avoid people who have a sickness you can catch (contagious).  Keep all follow-up visits as told by your doctor. This is important. Contact a doctor if:  You cough up more mucus than usual.  There is a change in the color or thickness of the mucus.  It is harder to breathe than usual.  Your breathing is faster than usual.  You have trouble sleeping.  You need to use your medicines more often than usual.  You have trouble doing your normal activities such as getting dressed   or walking around the house. Get help right away if:  You have shortness of breath while resting.  You have shortness of breath that stops you from: ? Being able to talk. ? Doing normal activities.  Your chest hurts for longer than 5 minutes.  Your skin color is more blue than usual.  Your pulse oximeter shows that you have low oxygen for longer than 5 minutes.  You have a fever.  You feel too tired to breathe normally. Summary  Chronic obstructive pulmonary disease (COPD) is a long-term lung problem.  The way your  lungs work will never return to normal. Usually the condition gets worse over time. There are things you can do to keep yourself as healthy as possible.  Take over-the-counter and prescription medicines only as told by your doctor.  If you smoke, stop. Smoking makes the problem worse. This information is not intended to replace advice given to you by your health care provider. Make sure you discuss any questions you have with your health care provider. Document Revised: 03/06/2017 Document Reviewed: 04/28/2016 Elsevier Patient Education  2020 [place] for Chronic Obstructive Pulmonary Disease Chronic obstructive pulmonary disease (COPD) causes symptoms such as shortness of breath, coughing, and chest discomfort. These symptoms can make it difficult to eat enough to maintain a healthy weight. Generally, people with COPD should eat a diet that is high in calories, protein, and other nutrients to maintain body weight and to keep the lungs as healthy as possible. Depending on the medicines you take and other health conditions you may have, your health care provider may give you additional recommendations on what to eat or avoid. Talk with your health care provider about your goals for body weight, and work with a dietitian to develop an eating plan that is right for you. What are tips for following this plan? Reading food labels   Avoid foods with more than 300 milligrams (mg) of salt (sodium) per serving.  Choose foods that contain at least 4 grams (g) of fiber per serving. Try to eat 20-30 g of fiber each day.  Choose foods that are high in calories and protein, such as nuts, beans, yogurt, and cheese. Shopping  Do not buy foods labeled as diet, low-calorie, or low-fat.  If you are able to eat dairy products: ? Avoid low-fat or skim milk. ? Buy dairy products that have at least 2% fat.  Buy nutritional supplement drinks.  Buy grains and prepared foods labeled as  enriched or fortified.  Consider buying low-sodium, pre-made foods to conserve energy for eating. Cooking  Add dry milk or protein powder to smoothies.  Cook with healthy fats, such as olive oil, canola oil, sunflower oil, and grapeseed oil.  Add oil, butter, cream cheese, or nut butters to foods to increase fat and calories.  To make foods easier to chew and swallow: ? Cook vegetables, pasta, and rice until soft. ? Cut or grind meat into very small pieces. ? Dip breads in liquid. Meal planning   Eat when you feel hungry.  Eat 5-6 small meals throughout the day.  Drink 6-8 glasses of water each day.  Do not drink liquids with meals. Drink liquids at the end of the meal to avoid feeling full too quickly.  Eat a variety of fruits and vegetables every day.  Ask for assistance from family or friends with planning and preparing meals as needed.  Avoid foods that cause you to feel bloated, such as  carbonated drinks, fried foods, beans, broccoli, cabbage, and apples.  For older adults, ask your local agency on aging whether you are eligible for meal assistance programs, such as Meals on Wheels. Lifestyle   Do not smoke.  Eat slowly. Take small bites and chew food well before swallowing.  Do not overeat. This may make it more difficult to breathe after eating.  Sit up while eating.  If needed, continue to use supplemental oxygen while eating.  Rest or relax for 30 minutes before and after eating.  Monitor your weight as told by your health care provider.  Exercise as told by your health care provider. What foods can I eat? Fruits All fresh, dried, canned, or frozen fruits that do not cause gas. Vegetables All fresh, canned (no salt added), or frozen vegetables that do not cause gas. Grains Whole grain bread. Enriched whole grain pasta. Fortified whole grain cereals. Fortified rice. Quinoa. Meats and other proteins Lean meat. Poultry. Fish. Dried beans. Unsalted  nuts. Tofu. Eggs. Nut butters. Dairy Whole or 2% milk. Cheese. Yogurt. Fats and oils Olive oil. Canola oil. Butter. Margarine. Beverages Water. Vegetable juice (no salt added). Decaffeinated coffee. Decaffeinated or herbal tea. Seasonings and condiments Fresh or dried herbs. Low-salt or salt-free seasonings. Low-sodium soy sauce. The items listed above may not be a complete list of foods and beverages you can eat. Contact a dietitian for more information. What foods are not recommended? Fruits Fruits that cause gas, such as apples or melon. Vegetables Vegetables that cause gas, such as broccoli, Brussels sprouts, cabbage, cauliflower, and onions. Canned vegetables with added salt. Meats and other proteins Fried meat. Salt-cured meat. Processed meat. Dairy Fat-free or low-fat milk, yogurt, or cheese. Processed cheese. Beverages Carbonated drinks. Caffeinated drinks, such as coffee, tea, and soft drinks. Juice. Alcohol. Vegetable juice with added salt. Seasonings and condiments Salt. Seasoning mixes with salt. Soy sauce. Kim [name]. Other foods Clear soup or broth. Fried foods. Prepared frozen meals. The items listed above may not be a complete list of foods and beverages you should avoid. Contact a dietitian for more information. Summary  COPD symptoms can make it difficult to eat enough to maintain a healthy weight.  A COPD eating plan can help you maintain your body weight and keep your lungs as healthy as possible.  Eat a diet that is high in calories, protein, and other nutrients. Read labels to make sure that you are getting the right nutrients. Cook foods to make them easy to chew and swallow.  Eat 5-6 small meals throughout the day, and avoid foods that cause gas or make you feel bloated. This information is not intended to replace advice given to you by your health care provider. Make sure you discuss any questions you have with your health care provider. Document Revised:  07/15/2018 Document Reviewed: 06/09/2017 Elsevier Patient Education  [place].

## 2019-07-13 NOTE — Progress Notes (Signed)
Went over discharge paperwork with patient's and patient's husband. They both verbalize understanding. IV has been taken out and tele monitor has been removed.

## 2019-07-13 NOTE — Plan of Care (Signed)
°  Problem: Education: °Goal: Knowledge of disease or condition will improve °Outcome: Adequate for Discharge °Goal: Knowledge of the prescribed therapeutic regimen will improve °Outcome: Adequate for Discharge °Goal: Individualized Educational Video(s) °Outcome: Adequate for Discharge °  °Problem: Activity: °Goal: Ability to tolerate increased activity will improve °Outcome: Adequate for Discharge °Goal: Will verbalize the importance of balancing activity with adequate rest periods °Outcome: Adequate for Discharge °  °Problem: Respiratory: °Goal: Ability to maintain a clear airway will improve °Outcome: Adequate for Discharge °Goal: Levels of oxygenation will improve °Outcome: Adequate for Discharge °Goal: Ability to maintain adequate ventilation will improve °Outcome: Adequate for Discharge °  °Problem: Education: °Goal: Knowledge of General Education information will improve °Description: Including pain rating scale, medication(s)/side effects and non-pharmacologic comfort measures °Outcome: Adequate for Discharge °  °Problem: Health Behavior/Discharge Planning: °Goal: Ability to manage health-related needs will improve °Outcome: Adequate for Discharge °  °Problem: Clinical Measurements: °Goal: Ability to maintain clinical measurements within normal limits will improve °Outcome: Adequate for Discharge °Goal: Will remain free from infection °Outcome: Adequate for Discharge °Goal: Diagnostic test results will improve °Outcome: Adequate for Discharge °Goal: Respiratory complications will improve °Outcome: Adequate for Discharge °Goal: Cardiovascular complication will be avoided °Outcome: Adequate for Discharge °  °Problem: Activity: °Goal: Risk for activity intolerance will decrease °Outcome: Adequate for Discharge °  °Problem: Nutrition: °Goal: Adequate nutrition will be maintained °Outcome: Adequate for Discharge °  °Problem: Coping: °Goal: Level of anxiety will decrease °Outcome: Adequate for Discharge °   °Problem: Elimination: °Goal: Will not experience complications related to bowel motility °Outcome: Adequate for Discharge °Goal: Will not experience complications related to urinary retention °Outcome: Adequate for Discharge °  °Problem: Pain Managment: °Goal: General experience of comfort will improve °Outcome: Adequate for Discharge °  °Problem: Safety: °Goal: Ability to remain free from injury will improve °Outcome: Adequate for Discharge °  °Problem: Skin Integrity: °Goal: Risk for impaired skin integrity will decrease °Outcome: Adequate for Discharge °  °

## 2019-07-14 ENCOUNTER — Other Ambulatory Visit (HOSPITAL_COMMUNITY): Payer: Medicare Other

## 2019-07-20 ENCOUNTER — Ambulatory Visit (INDEPENDENT_AMBULATORY_CARE_PROVIDER_SITE_OTHER): Payer: Medicare Other | Admitting: Cardiovascular Disease

## 2019-07-20 ENCOUNTER — Encounter: Payer: Self-pay | Admitting: Cardiovascular Disease

## 2019-07-20 ENCOUNTER — Other Ambulatory Visit: Payer: Self-pay

## 2019-07-20 VITALS — BP 136/62 | HR 84 | Ht 63.0 in | Wt 132.0 lb

## 2019-07-20 DIAGNOSIS — I2510 Atherosclerotic heart disease of native coronary artery without angina pectoris: Secondary | ICD-10-CM | POA: Diagnosis not present

## 2019-07-20 DIAGNOSIS — I428 Other cardiomyopathies: Secondary | ICD-10-CM

## 2019-07-20 DIAGNOSIS — E7800 Pure hypercholesterolemia, unspecified: Secondary | ICD-10-CM | POA: Diagnosis not present

## 2019-07-20 NOTE — Patient Instructions (Signed)

## 2019-07-20 NOTE — Progress Notes (Signed)
Chief Complaint  Patient presents with  . Follow-up    CAD    History of Present Illness: 64 yo female with history of NICM, COPD, HLD, tobacco abuse here today for cardiac follow up. I saw her as a new patient in April 2016. She was hospitalized in [place] December 2015 with COPD exacerbation. Troponin was mildly elevated at 2. Cardiac catheterization in 2015 showed no obstructive CAD. She was felt to have a COPD exacerbation and she improved with treatment of her COPD. She quit smoking in September 2015. Admitted to [facility] March 2017 with respiratory distress, CHF. Cardiac cath March 2017 with minimal CAD, severe LV systolic dysfunction ([id number]). 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  She was diuresed with clinical improvement. She was seen several times in our office after discharge with dizziness and hypotension. Lisinopril and Lasix were decreased. She is followed in the pulmonary office by Dr. Elsworth Soho. Event monitor April 2017 with sinus, PACs, PVCs. Echo May 2018 with normal LV systolic function, mild AI. Most recent echo April 2021 with normal LV systolic function and mild AI. She was admitted to [facility] April 4-7, 2021 with a COPD exacerbation.   She is here today for follow up. The patient denies any chest pain, palpitations, lower extremity edema, orthopnea, PND, dizziness, near syncope or syncope. Her breathing is much better.   Primary Care Physician: [name], NP   Past Medical History:  Diagnosis Date  . C8 RADICULOPATHY 06/05/2009  . COPD 08/31/2007  . DEPRESSION 11/19/2006  . DYSLIPIDEMIA 03/12/2009  . MI (mitral incompetence)   . 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 OSTEOARTHRITIS 06/05/2009  . PARESTHESIA 08/24/2007  . TOBACCO ABUSE 01/25/2010    Past Surgical History:  Procedure Laterality Date  . ABDOMINAL HYSTERECTOMY    . APPENDECTOMY  1969  . CARDIAC CATHETERIZATION N/A 07/05/2015    Procedure: Left Heart Cath and Coronary Angiography;  Surgeon: Leonie Man, MD;  Location: [place] [facility];  Service: Cardiovascular;  Laterality: N/A;  . CHOLECYSTECTOMY  1989  . collapse lung  1990  . COLONOSCOPY WITH PROPOFOL N/A 05/14/2018   Procedure: COLONOSCOPY WITH PROPOFOL;  Surgeon: Irene Shipper, MD;  Location: [facility];  Service: Endoscopy;  Laterality: N/A;  . EXCISION MASS ABDOMINAL  2019  . EXPLORATORY LAPAROTOMY  1969  . POLYPECTOMY  05/14/2018   Procedure: POLYPECTOMY;  Surgeon: Irene Shipper, MD;  Location: [facility];  Service: Endoscopy;;  . SALPINGOOPHORECTOMY  2019    Current Outpatient Medications  Medication Sig Dispense Refill  . albuterol (VENTOLIN HFA) 108 (90 Base) MCG/ACT inhaler Take two puffs every 4-6 hours as needed for shortness of breath/wheezing (Patient taking differently: Inhale 2 puffs into the lungs every 6 (six) hours as needed for wheezing or shortness of breath. ) 54 g 1  . ALPRAZolam (XANAX) 0.5 MG tablet TAKE 1/2 TO 1 TABLET BY MOUTH THREE TIMES DAILY (Patient taking differently: Take 0.5 mg by mouth 3 (three) times daily. ) 60 tablet 2  . aspirin EC 81 MG EC tablet Take 1 tablet (81 mg total) by mouth daily.    . budesonide-formoterol (SYMBICORT) 160-4.5 MCG/ACT inhaler Inhale 2 puffs into the lungs 2 (two) times daily. 30.6 g 2  . Cholecalciferol (VITAMIN D3) 1000 units CAPS Take 1 capsule (1,000 Units total) by mouth 2 (two) times daily. 60 capsule 6  . furosemide (LASIX) 40 MG tablet Take 1 tablet (40 mg total)  by mouth daily as needed (for weight gain of 3 lbs). 90 tablet 1  . HYDROcodone-acetaminophen (NORCO) 7.5-325 MG tablet Take 1 tablet by mouth every 6 (six) hours as needed for moderate pain. 45 tablet 0  . ipratropium-albuterol (DUONEB) 0.5-2.5 (3) MG/3ML SOLN Take 3 mLs by nebulization every 6 (six) hours as needed. Dx: J44.9 360 mL 1  . losartan (COZAAR) 25 MG tablet Take 1 tablet (25 mg total) by mouth daily. 90 tablet 1   . nitroGLYCERIN (NITROSTAT) 0.4 MG SL tablet Place 1 tablet (0.4 mg total) under the tongue every 5 (five) minutes as needed for chest pain. 25 tablet 3  . PARoxetine (PAXIL CR) 25 MG 24 hr tablet Take 1 tablet (25 mg total) by mouth every morning. 90 tablet 1  . predniSONE (DELTASONE) 10 MG tablet Take 6 tablets (60 mg total) by mouth daily for 2 days, THEN 5 tablets (50 mg total) daily for 2 days, THEN 4 tablets (40 mg total) daily for 2 days, THEN 3 tablets (30 mg total) daily for 2 days, THEN 2 tablets (20 mg total) daily for 2 days, THEN 1 tablet (10 mg total) daily for 2 days. 42 tablet 0  . Spacer/Aero Chamber Mouthpiece MISC 1 Device by Does not apply route as directed. 1 each 0  . Tiotropium Bromide Monohydrate (SPIRIVA RESPIMAT) 2.5 MCG/ACT AERS USE 2 INHALATIONS BY MOUTH  DAILY (Patient taking differently: Inhale 2 puffs into the lungs daily. ) 12 g 3  . vitamin B-12 (CYANOCOBALAMIN) 500 MCG tablet Take 500 mcg by mouth 2 (two) times daily.     No current facility-administered medications for this visit.    Allergies  Allergen Reactions  . Ciprofloxacin     Hallucinations   . Lisinopril     cough  . Penicillins Hives    Has patient had a PCN reaction causing immediate rash, facial/tongue/throat swelling, SOB or lightheadedness with hypotension: Yes Has patient had a PCN reaction causing severe rash involving mucus membranes or skin necrosis: No Has patient had a PCN reaction that required hospitalization No Has patient had a PCN reaction occurring within the last 10 years: No If all of the above answers are "NO", then may proceed with Cephalosporin use.     Social History   Socioeconomic History  . Marital status: Married    Spouse name: [name]  . Number of children: 2  . Years of education: Not on file  . Highest education level: Not on file  Occupational History  . Occupation: Patient care aide-sits with elderly lady  Tobacco Use  . Smoking status: Former Smoker     Packs/day: 2.00    Years: 38.00    Pack years: 76.00    Types: Cigarettes    Quit date: 03/07/2014    Years since quitting: 5.3  . Smokeless tobacco: Never Used  Substance and Sexual Activity  . Alcohol use: No    Alcohol/week: 0.0 standard drinks  . Drug use: No  . Sexual activity: Not on file  Other Topics Concern  . Not on file  Social History Narrative   [facility] (01/12/17):   Originally from [facility]. Has lived in [place] for 17 years. Married. Has 2 dogs currently. No mold and only 1 indoor plant. Had her home professionally cleaned a month ago. No bird or hot tub exposure. 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    Social Determinants of Health   Financial Resource Strain:   .  Difficulty of Paying Living Expenses:   Food Insecurity:   . Worried About Charity fundraiser in the Last Year:   . Arboriculturist in the Last Year:   Transportation Needs:   . Film/video editor (Medical):   Marland Kitchen Lack of Transportation (Non-Medical):   Physical Activity:   . Days of Exercise per Week:   . Minutes of Exercise per Session:   Stress:   . Feeling of Stress :   Social Connections:   . Frequency of Communication with Friends and Family:   . Frequency of Social Gatherings with Friends and Family:   . Attends Religious Services:   . Active Member of Clubs or Organizations:   . Attends Archivist Meetings:   Marland Kitchen Marital Status:   Intimate Partner Violence:   . Fear of Current or Ex-Partner:   . Emotionally Abused:   Marland Kitchen Physically Abused:   . Sexually Abused:     Family History  Problem Relation Age of Onset  . Cancer Mother        lung  . Heart attack Mother   . Hypertension Sister   . Multiple sclerosis Brother   . Diabetes Sister   . Rheum arthritis Sister   . Thyroid disease Sister   . Multiple sclerosis Other   . Alcohol abuse Other   . Arthritis Other   . Diabetes Other   . Kidney disease Other   . Cancer Other         lung,ovarian,skin, uterine  . Stroke Other   . Heart disease Other   . Melanoma Other   . Heart attack Maternal Uncle   . Heart attack Other        NEICE  . Heart attack Other        NEPHEW  . Hypertension Brother   . Stroke Maternal Grandmother   . Colon cancer Neg Hx     Review of Systems:  As stated in the HPI and otherwise negative.   BP 136/62   Pulse 84   Ht 5\' 3"  (1.6 m)   Wt 132 lb (59.9 kg)   SpO2 97%   BMI 23.38 kg/m   Physical Examination:  General: Well developed, well nourished, NAD  HEENT: OP clear, mucus membranes moist  SKIN: warm, dry. No rashes. Neuro: No focal deficits  Musculoskeletal: Muscle strength 5/5 all ext  Psychiatric: Mood and affect normal  Neck: No JVD, no carotid bruits, no thyromegaly, no lymphadenopathy.  Lungs:Clear bilaterally, no wheezes, rhonci, crackles Cardiovascular: Regular rate and rhythm. No murmurs, gallops or rubs. Abdomen:Soft. Bowel sounds present. Non-tender.  Extremities: No lower extremity edema. Pulses are 2 + in the bilateral DP/PT.  Echo April 2021: 1. Left ventricular ejection fraction, by estimation, is 60 to 65%. The  left ventricle has normal function. The left ventricle has no regional  wall motion abnormalities. Left ventricular diastolic parameters are  consistent with Grade I diastolic  dysfunction (impaired relaxation).  2. Right ventricular systolic function is normal. The right ventricular  size is normal. Tricuspid regurgitation signal is inadequate for assessing  PA pressure.  3. The mitral valve is grossly normal. No evidence of mitral valve  regurgitation. No evidence of mitral stenosis.  4. The aortic valve is tricuspid. Aortic valve regurgitation is mild.  Mild aortic valve sclerosis is present, with no evidence of aortic valve  stenosis.  5. The inferior vena cava is normal in size with greater than 50%  respiratory variability, suggesting  right atrial pressure of 3 mmHg.   EKG:  EKG is  not ordered today. The ekg ordered today demonstrates   Recent Labs: 12/17/2018: TSH 1.76 07/10/2019: ALT 14; B Natriuretic Peptide 42.4 07/13/2019: BUN 19; Creatinine, Ser 0.87; Hemoglobin 13.4; Platelets 271; Potassium 5.0; Sodium 142   Lipid Panel    Component Value Date/Time   CHOL 131 12/17/2018 0906   CHOL 221 (H) 01/25/2018 0811   TRIG 104.0 12/17/2018 0906   HDL 44.40 12/17/2018 0906   HDL 41 01/25/2018 0811   CHOLHDL 3 12/17/2018 0906   VLDL 20.8 12/17/2018 0906   LDLCALC 66 12/17/2018 0906   LDLCALC 151 (H) 01/25/2018 0811   LDLDIRECT 155.4 12/29/2012 1209     Wt Readings from Last 3 Encounters:  07/20/19 132 lb (59.9 kg)  07/13/19 131 lb 11.2 oz (59.7 kg)  07/01/19 133 lb 6.4 oz (60.5 kg)     Other studies Reviewed: Additional studies/ records that were reviewed today include: Records from [facility]. Review of the above records demonstrates:   Assessment and Plan:   1. CAD without angina: No chest pain. Mild CAD by cath in 2017. Continue ASA and statin  2. Hyperlipidemia: LDL at goal in 2020. Continue statin.   3.  Chronic systolic CHF/Non-ischemic cardiomyopathy: LV function normal by echo April 2021. Continue Losartan. She is off of the beta blocker due to her COPD  4. COPD: closely followed by pulmonary.  Current medicines are reviewed at length with the patient today.  The patient does not have concerns regarding medicines.  The following changes have been made:  no change  Labs/ tests ordered today include:   No orders of the defined types were placed in this encounter.   Disposition:   FU with me in 12  months  Signed, Lauree Chandler, MD 07/20/2019 12:06 PM    [address], [place], [place]  [address] Phone: [phone]; Fax: [phone]

## 2019-07-21 ENCOUNTER — Encounter: Payer: Self-pay | Admitting: Adult Health

## 2019-07-21 ENCOUNTER — Other Ambulatory Visit (HOSPITAL_COMMUNITY): Payer: Medicare Other

## 2019-07-21 ENCOUNTER — Ambulatory Visit (INDEPENDENT_AMBULATORY_CARE_PROVIDER_SITE_OTHER): Payer: Medicare Other | Admitting: Adult Health

## 2019-07-21 VITALS — BP 130/66 | Temp 98.5°F | Wt 135.0 lb

## 2019-07-21 DIAGNOSIS — F32A Depression, unspecified: Secondary | ICD-10-CM

## 2019-07-21 DIAGNOSIS — G8929 Other chronic pain: Secondary | ICD-10-CM | POA: Diagnosis not present

## 2019-07-21 DIAGNOSIS — F419 Anxiety disorder, unspecified: Secondary | ICD-10-CM | POA: Diagnosis not present

## 2019-07-21 DIAGNOSIS — M159 Polyosteoarthritis, unspecified: Secondary | ICD-10-CM

## 2019-07-21 DIAGNOSIS — M8949 Other hypertrophic osteoarthropathy, multiple sites: Secondary | ICD-10-CM

## 2019-07-21 DIAGNOSIS — F329 Major depressive disorder, single episode, unspecified: Secondary | ICD-10-CM | POA: Diagnosis not present

## 2019-07-21 DIAGNOSIS — I2510 Atherosclerotic heart disease of native coronary artery without angina pectoris: Secondary | ICD-10-CM | POA: Diagnosis not present

## 2019-07-21 MED ORDER — PAROXETINE HCL 30 MG PO TABS: 30.0000 mg | ORAL_TABLET | Freq: Every day | ORAL | 0 refills | Status: DC

## 2019-07-21 NOTE — Progress Notes (Signed)
Subjective:    Patient ID: Kim [name], female    DOB: 1955/07/08, 64 y.o.   MRN: [id number]  HPI .NCCSRS reviewed in EPIC   Indication for chronic opioid: Osteoarthritis involving multiple joints Medication and dose: Norco 7.5-325 mg, every 6 hours PRN  # pills per month: 45 Last UDS date: 03/01/2019 Opioid Treatment Agreement signed: Yes Opioid Treatment Agreement last reviewed with patient: 03/01/2019 NCCSRS reviewed this encounter (include red flags):  None  She feels well controlled on this dose  She also needs to have her Paxil switched from CR, the price of this is over $200 per 90 days. She feels well controlled on this medication   Review of Systems See HPI   Past Medical History:  Diagnosis Date  . C8 RADICULOPATHY 06/05/2009  . COPD 08/31/2007  . DEPRESSION 11/19/2006  . DYSLIPIDEMIA 03/12/2009  . MI (mitral incompetence)   . 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 OSTEOARTHRITIS 06/05/2009  . PARESTHESIA 08/24/2007  . TOBACCO ABUSE 01/25/2010    Social History   Socioeconomic History  . Marital status: Married    Spouse name: [name]  . Number of children: 2  . Years of education: Not on file  . Highest education level: Not on file  Occupational History  . Occupation: Patient care aide-sits with elderly lady  Tobacco Use  . Smoking status: Former Smoker    Packs/day: 2.00    Years: 38.00    Pack years: 76.00    Types: Cigarettes    Quit date: 03/07/2014    Years since quitting: 5.3  . Smokeless tobacco: Never Used  Substance and Sexual Activity  . Alcohol use: No    Alcohol/week: 0.0 standard drinks  . Drug use: No  . Sexual activity: Not on file  Other Topics Concern  . Not on file  Social History Narrative   [facility] (01/12/17):   Originally from [facility]. Has lived in [place] for 17 years. Married. Has 2 dogs currently. No mold and only 1 indoor plant. Had her home professionally cleaned a month ago. No bird or hot tub exposure. 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    Social Determinants of Health   Financial Resource Strain:   . Difficulty of Paying Living Expenses:   Food Insecurity:   . Worried About Charity fundraiser in the Last Year:   . Arboriculturist in the Last Year:   Transportation Needs:   . Film/video editor (Medical):   Marland Kitchen Lack of Transportation (Non-Medical):   Physical Activity:   . Days of Exercise per Week:   . Minutes of Exercise per Session:   Stress:   . Feeling of Stress :   Social Connections:   . Frequency of Communication with Friends and Family:   . Frequency of Social Gatherings with Friends and Family:   . Attends Religious Services:   . Active Member of Clubs or Organizations:   . Attends Archivist Meetings:   Marland Kitchen Marital Status:   Intimate Partner Violence:   . Fear of Current or Ex-Partner:   . Emotionally Abused:   Marland Kitchen Physically Abused:   . Sexually Abused:     Past Surgical History:  Procedure Laterality Date  . ABDOMINAL HYSTERECTOMY    . APPENDECTOMY  1969  . CARDIAC CATHETERIZATION N/A 07/05/2015   Procedure: Left Heart Cath and Coronary Angiography;  Surgeon: Leonie Man, MD;  Location: [facility];  Service:  Cardiovascular;  Laterality: N/A;  . CHOLECYSTECTOMY  1989  . collapse lung  1990  . COLONOSCOPY WITH PROPOFOL N/A 05/14/2018   Procedure: COLONOSCOPY WITH PROPOFOL;  Surgeon: Irene Shipper, MD;  Location: [facility];  Service: Endoscopy;  Laterality: N/A;  . EXCISION MASS ABDOMINAL  2019  . EXPLORATORY LAPAROTOMY  1969  . POLYPECTOMY  05/14/2018   Procedure: POLYPECTOMY;  Surgeon: Irene Shipper, MD;  Location: [facility];  Service: Endoscopy;;  . SALPINGOOPHORECTOMY  2019    Family History  Problem Relation Age of Onset  . Cancer Mother        lung  . Heart attack Mother   . Hypertension Sister   . Multiple sclerosis Brother   . Diabetes Sister   . Rheum arthritis Sister   . Thyroid disease Sister   . Multiple  sclerosis Other   . Alcohol abuse Other   . Arthritis Other   . Diabetes Other   . Kidney disease Other   . Cancer Other        lung,ovarian,skin, uterine  . Stroke Other   . Heart disease Other   . Melanoma Other   . Heart attack Maternal Uncle   . Heart attack Other        NEICE  . Heart attack Other        NEPHEW  . Hypertension Brother   . Stroke Maternal Grandmother   . Colon cancer Neg Hx     Allergies  Allergen Reactions  . Ciprofloxacin     Hallucinations   . Lisinopril     cough  . Penicillins Hives    Has patient had a PCN reaction causing immediate rash, facial/tongue/throat swelling, SOB or lightheadedness with hypotension: Yes Has patient had a PCN reaction causing severe rash involving mucus membranes or skin necrosis: No Has patient had a PCN reaction that required hospitalization No Has patient had a PCN reaction occurring within the last 10 years: No If all of the above answers are "NO", then may proceed with Cephalosporin use.     Current Outpatient Medications on File Prior to Visit  Medication Sig Dispense Refill  . albuterol (VENTOLIN HFA) 108 (90 Base) MCG/ACT inhaler Take two puffs every 4-6 hours as needed for shortness of breath/wheezing (Patient taking differently: Inhale 2 puffs into the lungs every 6 (six) hours as needed for wheezing or shortness of breath. ) 54 g 1  . ALPRAZolam (XANAX) 0.5 MG tablet TAKE 1/2 TO 1 TABLET BY MOUTH THREE TIMES DAILY (Patient taking differently: Take 0.5 mg by mouth 3 (three) times daily. ) 60 tablet 2  . aspirin EC 81 MG EC tablet Take 1 tablet (81 mg total) by mouth daily.    . budesonide-formoterol (SYMBICORT) 160-4.5 MCG/ACT inhaler Inhale 2 puffs into the lungs 2 (two) times daily. 30.6 g 2  . Cholecalciferol (VITAMIN D3) 1000 units CAPS Take 1 capsule (1,000 Units total) by mouth 2 (two) times daily. 60 capsule 6  . furosemide (LASIX) 40 MG tablet Take 1 tablet (40 mg total) by mouth daily as needed (for  weight gain of 3 lbs). 90 tablet 1  . HYDROcodone-acetaminophen (NORCO) 7.5-325 MG tablet Take 1 tablet by mouth every 6 (six) hours as needed for moderate pain. 45 tablet 0  . ipratropium-albuterol (DUONEB) 0.5-2.5 (3) MG/3ML SOLN Take 3 mLs by nebulization every 6 (six) hours as needed. Dx: J44.9 360 mL 1  . losartan (COZAAR) 25 MG tablet Take 1 tablet (25 mg total) by  mouth daily. 90 tablet 1  . nitroGLYCERIN (NITROSTAT) 0.4 MG SL tablet Place 1 tablet (0.4 mg total) under the tongue every 5 (five) minutes as needed for chest pain. 25 tablet 3  . predniSONE (DELTASONE) 10 MG tablet Take 6 tablets (60 mg total) by mouth daily for 2 days, THEN 5 tablets (50 mg total) daily for 2 days, THEN 4 tablets (40 mg total) daily for 2 days, THEN 3 tablets (30 mg total) daily for 2 days, THEN 2 tablets (20 mg total) daily for 2 days, THEN 1 tablet (10 mg total) daily for 2 days. 42 tablet 0  . Spacer/Aero Chamber Mouthpiece MISC 1 Device by Does not apply route as directed. 1 each 0  . Tiotropium Bromide Monohydrate (SPIRIVA RESPIMAT) 2.5 MCG/ACT AERS USE 2 INHALATIONS BY MOUTH  DAILY (Patient taking differently: Inhale 2 puffs into the lungs daily. ) 12 g 3  . vitamin B-12 (CYANOCOBALAMIN) 500 MCG tablet Take 500 mcg by mouth 2 (two) times daily.     No current facility-administered medications on file prior to visit.    BP 130/66   Temp 98.5 F (36.9 C)   Wt 135 lb (61.2 kg)   BMI 23.91 kg/m       Objective:   Physical Exam Vitals and nursing note reviewed.  Constitutional:      Appearance: Normal appearance.  Cardiovascular:     Rate and Rhythm: Normal rate and regular rhythm.     Pulses: Normal pulses.     Heart sounds: Normal heart sounds.  Pulmonary:     Effort: Pulmonary effort is normal.     Breath sounds: Wheezing (trace throughout ) present.  Musculoskeletal:        General: No swelling or tenderness. Normal range of motion.  Skin:    General: Skin is warm and dry.   Neurological:     General: No focal deficit present.     Mental Status: She is alert and oriented to person, place, and time.  Psychiatric:        Mood and Affect: Mood normal.        Behavior: Behavior normal.        Thought Content: Thought content normal.        Judgment: Judgment normal.       Assessment & Plan:  1. Primary osteoarthritis involving multiple joints  - Pain Mgmt, Profile 7 w/Conf, U  2. Encounter for chronic pain management  - Pain Mgmt, Profile 7 w/Conf, U  3. Anxiety and depression  - PARoxetine (PAXIL) 30 MG tablet; Take 1 tablet (30 mg total) by mouth daily.  Dispense: 90 tablet; Refill: 0   [name], NP

## 2019-07-24 LAB — PAIN MGMT, PROFILE 7 W/CONF, U
6 Acetylmorphine: NEGATIVE ng/mL
Alcohol Metabolites: NEGATIVE ng/mL (ref <500)
Alphahydroxyalprazolam: 213 ng/mL
Alphahydroxymidazolam: NEGATIVE ng/mL
Alphahydroxytriazolam: NEGATIVE ng/mL
Aminoclonazepam: NEGATIVE ng/mL
Amphetamines: NEGATIVE ng/mL
Barbiturates: NEGATIVE ng/mL
Benzodiazepines: POSITIVE ng/mL
Cocaine Metabolite: NEGATIVE ng/mL
Codeine: NEGATIVE ng/mL
Creatinine: 126.8 mg/dL
Hydrocodone: 608 ng/mL
Hydromorphone: 53 ng/mL
Hydroxyethylflurazepam: NEGATIVE ng/mL
Lorazepam: NEGATIVE ng/mL
Marijuana Metabolite: NEGATIVE ng/mL
Methadone Metabolite: NEGATIVE ng/mL
Morphine: NEGATIVE ng/mL
Nordiazepam: NEGATIVE ng/mL
Norhydrocodone: 1223 ng/mL
Opiates: POSITIVE ng/mL
Oxazepam: NEGATIVE ng/mL
Oxidant: NEGATIVE ug/mL
Oxycodone: NEGATIVE ng/mL
Temazepam: NEGATIVE ng/mL
pH: 6.7 (ref 4.5–9.0)

## 2019-07-26 ENCOUNTER — Telehealth: Payer: Self-pay | Admitting: Adult Health

## 2019-07-26 DIAGNOSIS — M159 Polyosteoarthritis, unspecified: Secondary | ICD-10-CM

## 2019-07-26 MED ORDER — HYDROCODONE-ACETAMINOPHEN 7.5-325 MG PO TABS: 1.0000 | ORAL_TABLET | Freq: Four times a day (QID) | ORAL | 0 refills | Status: DC | PRN

## 2019-07-26 NOTE — Telephone Encounter (Signed)
Updated patient on her labs   Pain medication sent to pharmacy for 3 months

## 2019-07-28 ENCOUNTER — Ambulatory Visit: Payer: Medicare Other | Admitting: Cardiovascular Disease

## 2019-08-12 ENCOUNTER — Encounter: Payer: Self-pay | Admitting: Primary Care

## 2019-08-12 ENCOUNTER — Other Ambulatory Visit: Payer: Self-pay

## 2019-08-12 ENCOUNTER — Ambulatory Visit (INDEPENDENT_AMBULATORY_CARE_PROVIDER_SITE_OTHER): Payer: Medicare Other | Admitting: Primary Care

## 2019-08-12 DIAGNOSIS — J449 Chronic obstructive pulmonary disease, unspecified: Secondary | ICD-10-CM | POA: Diagnosis not present

## 2019-08-12 MED ORDER — AZITHROMYCIN 250 MG PO TABS: ORAL_TABLET | ORAL | 0 refills | Status: DC

## 2019-08-12 MED ORDER — PREDNISONE 10 MG PO TABS: ORAL_TABLET | ORAL | 0 refills | Status: DC

## 2019-08-12 MED ORDER — ROFLUMILAST 500 MCG PO TABS: 250.0000 ug | ORAL_TABLET | Freq: Every day | ORAL | 0 refills | Status: DC

## 2019-08-12 NOTE — Patient Instructions (Addendum)
Rx: Zpack as prescribed  Prednsione taper as prescribed   COPD: Continued Symbicort two puffs twice daily Continue Spiriva Continue mucinex twice daily  Follow-up: 7-10 days with Beth to discuss starting daliresp      Roflumilast oral tablets What is this medicine? ROFLUMILAST (roe FLUE mi last) decreases inflammation in the lungs. This medicine is used to prevent COPD flare-ups. Do not use this medicine to treat sudden breathing problems. This medicine may be used for other purposes; ask your health care provider or pharmacist if you have questions. COMMON BRAND NAME(S): Daliresp What should I tell my health care provider before I take this medicine? They need to know if you have any of these conditions:  liver disease  mental illness  an unusual or allergic reaction to roflumilast, other medicines, foods, dyes, or preservatives  pregnant or trying to get pregnant  breast-feeding How should I use this medicine? Take this medicine by mouth with a glass of water. Follow the directions on the prescription label. You can take it with or without food. If it upsets your stomach, take it with food. Take your medicine at regular intervals. Do not take it more often than directed. Do not stop taking except on your doctor's advice. A special MedGuide will be given to you by the pharmacist with each prescription and refill. Be sure to read this information carefully each time. Talk to your pediatrician regarding the use of this medicine in children. Special care may be needed. Overdosage: If you think you have taken too much of this medicine contact a poison control center or emergency room at once. NOTE: This medicine is only for you. Do not share this medicine with others. What if I miss a dose? If you miss a dose, take it as soon as you can. If it is almost time for your next dose, take only that dose. Do not take double or extra doses. What may interact with this  medicine?  carbamazepine  cimetidine  enoxacin  erythromycin  fluvoxamine  ketoconazole  phenobarbital  phenytoin  rifampicin  St. John's wort This list may not describe all possible interactions. Give your health care provider a list of all the medicines, herbs, non-prescription drugs, or dietary supplements you use. Also tell them if you smoke, drink alcohol, or use illegal drugs. Some items may interact with your medicine. What should I watch for while using this medicine? Visit your doctor for regular check ups. Tell your doctor or healthcare professional if your symptoms do not start to get better or if they get worse. What side effects may I notice from receiving this medicine? Side effects that you should report to your doctor or health care professional as soon as possible:  allergic reactions like skin rash, itching or hives, swelling of the face, lips, or tongue  anxious  breathing problems  suicidal thoughts or other mood changes  trouble sleeping  weight loss Side effects that usually do not require medical attention (report to your doctor or health care professional if they continue or are bothersome):  back pain  diarrhea  dizziness  general ill feeling or flu-like symptoms  headache  loss of appetite  nausea, vomiting This list may not describe all possible side effects. Call your doctor for medical advice about side effects. You may report side effects to FDA at 1-800-FDA-1088. Where should I keep my medicine? Keep out of the reach of children. Store at room temperature between 15 and 30 degrees C (59  and 86 degrees F). Throw away any unused medicine after the expiration date. NOTE: This sheet is a summary. It may not cover all possible information. If you have questions about this medicine, talk to your doctor, pharmacist, or health care provider.  2020 Elsevier/Gold Standard (2015-04-26 09:35:23)

## 2019-08-12 NOTE — Assessment & Plan Note (Signed)
-   Symptoms consistent with severe COPD. Frequent exacerbations. NO benefit from Trelegy 200.  - Treating for acute COPD exacerbation with azithromycin x 5 days and extended prednisone taper (60mg  x 3 days; 50mg  x 3 days; 40mg  x 3 days; 30mg  x 3 days; 20mg  x 3 days; 10mg  x 3 days) - Continue Symbicort 160 + Spiriva Respimat 2.88mcg - Follow-up in 7-10 days to discuss starting daliresp sample

## 2019-08-12 NOTE — Progress Notes (Signed)
@Patient  ID: Kim [name], female    DOB: July 31, 1955, 64 y.o.   MRN: [id number]  Chief Complaint  Patient presents with  . Acute Visit    SOB x3 days, cough and wheezing     Referring provider: [name], NP  HPI:  63 year old female, former smoker quit 2015 (76 pack year hx). PMH significant for COPD GOLD III, chronic respiratory failure, allergic rhinitis, coronary artery calcification, HLD, benign neoplasm ascending colon. Patient of Dr. Elsworth Soho. Maintained on Symbicort 160 and Spiriva.    Previous [facility] pulmonary encounter: 05/13/2019 Patient contacted today for acute televisit. Reports wheezing and congested cough x 2 days. States that she did initially improve after completing doxycycline and prednisone taper. Her cough is thick with beige/white mucus. She has used her Duoneb twice today with minimal improvement in wheezing.  States that she does better with azithromycin antibiotic. Denies sick contact. COVID negative on 04/27/19. Declining ED eval.  05/17/2019 Patient contacted today for 4 day follow-up COPD exacerbation. Shortness of breath, wheezing and coughing symptoms have been persistent off/on for three months. She does feel 25% better since telephone visit on Friday. She completed Zpack today and has three days of prednisone taper left. Continues to have moderate amount of wheezing and productive cough with extremely thick clear/beige mucus. She is taking 1,200mg  mucinex twice and using duoneb 3-4 times a day followed by her flutter valve. She has panic attacks with coughing fits. Takes xanax as needed. LDCT in September centrilobular and paraseptal emphysema. Reports that she does better with higher dose prednisone taper from Dr. Ashok Cordia. No history of diabetes. She was Covid negative two weeks. She lives with her husband, she has not left her house. Denies fever, chills, sweats, HA, N/V/D.   06/03/2019 Patient presents today for 2 week follow-up. She is feeling much better.  Completed extended prednisone taper. She still has some wheezing which is baseline for her. She did not take her inhalers this morning but reports compliance with them. Needs refill of Albuterol, uses her rescue inhaler 3-4 times a day. Chronic cough. Some rare relux related to diet. Uses her oxygen only as needed, brought it with her today but she is not wearing it. O2 93% on RA. 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   08/12/2019 Patient presents today for acute visit. She was in the hospital for three days in April for COPD exacerbation. Treated with IV steriods and discharged with prednisone taper. CXR showed no acute process.  Reports increased shortness of breath, productive cough x 3 days. Associated wheezing. Cough is productive with beige to yellow mucus. She is taking mucinex twice a day. Resumed to Symbicort and Spiriva. No perceived benefit from Trelegy 200. Not sleeping because of cough. She is taking Norco 1 tablet every 6 hours for pain and for cough suppression.   COPD exacerbations: June 2020- COPD exacerbation/Prednisone + zpack September- prednisone PCP December 2020- Prednisone + zpack ? PCP January 18th 2021- COPD exacerbation/Prednisone + doxycycline  February 5th 2021- COPD exacerbation/Prednisone + Zpack  February 9th 2021- Prolonged COPD exacerbation requiring high dose prednisone/ CXR ordered  April 4th- Hospitalized for COPD exacerbation treated with IV steriods and oral prednisone taper 08/12/2019 - Zpack and extrended prednisone taper    Allergies  Allergen Reactions  . Ciprofloxacin     Hallucinations   . Lisinopril     cough  . Penicillins Hives    Has patient had a PCN reaction causing  immediate rash, facial/tongue/throat swelling, SOB or lightheadedness with hypotension: Yes Has patient had a PCN reaction causing severe rash involving mucus membranes or skin necrosis: No Has patient had a PCN reaction that required hospitalization No Has  patient had a PCN reaction occurring within the last 10 years: No If all of the above answers are "NO", then may proceed with Cephalosporin use.     Immunization History  Administered Date(s) Administered  . Influenza Split 01/31/2011, 01/27/2012, 03/25/2014  . Influenza Whole 01/05/2008, 03/12/2009, 03/13/2010  . Influenza,inj,Quad PF,6+ Mos 12/29/2012, 12/20/2014, 01/07/2017, 12/17/2017, 12/17/2018  . Influenza-Unspecified 02/11/2016  . PFIZER SARS-COV-2 Vaccination 06/29/2019, 07/25/2019  . Pneumococcal Conjugate-13 08/10/2015  . Pneumococcal Polysaccharide-23 05/08/2006  . Tdap 01/31/2011    Past Medical History:  Diagnosis Date  . C8 RADICULOPATHY 06/05/2009  . COPD 08/31/2007  . DEPRESSION 11/19/2006  . DYSLIPIDEMIA 03/12/2009  . MI (mitral incompetence)   . 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 OSTEOARTHRITIS 06/05/2009  . PARESTHESIA 08/24/2007  . TOBACCO ABUSE 01/25/2010    Tobacco History: Social History   Tobacco Use  Smoking Status Former Smoker  . Packs/day: 2.00  . Years: 38.00  . Pack years: 76.00  . Types: Cigarettes  . Quit date: 03/07/2014  . Years since quitting: 5.4  Smokeless Tobacco Never Used   Counseling given: Not Answered   Outpatient Medications Prior to Visit  Medication Sig Dispense Refill  . albuterol (VENTOLIN HFA) 108 (90 Base) MCG/ACT inhaler Take two puffs every 4-6 hours as needed for shortness of breath/wheezing (Patient taking differently: Inhale 2 puffs into the lungs every 6 (six) hours as needed for wheezing or shortness of breath. ) 54 g 1  . ALPRAZolam (XANAX) 0.5 MG tablet TAKE 1/2 TO 1 TABLET BY MOUTH THREE TIMES DAILY (Patient taking differently: Take 0.5 mg by mouth 3 (three) times daily. ) 60 tablet 2  . aspirin EC 81 MG EC tablet Take 1 tablet (81 mg total) by mouth daily.    . budesonide-formoterol (SYMBICORT) 160-4.5 MCG/ACT inhaler Inhale 2 puffs into the lungs 2 (two) times daily. 30.6 g 2  . Cholecalciferol (VITAMIN D3) 1000 units  CAPS Take 1 capsule (1,000 Units total) by mouth 2 (two) times daily. 60 capsule 6  . furosemide (LASIX) 40 MG tablet Take 1 tablet (40 mg total) by mouth daily as needed (for weight gain of 3 lbs). 90 tablet 1  . HYDROcodone-acetaminophen (NORCO) 7.5-325 MG tablet Take 1 tablet by mouth every 6 (six) hours as needed for moderate pain. 45 tablet 0  . HYDROcodone-acetaminophen (NORCO) 7.5-325 MG tablet Take 1 tablet by mouth every 6 (six) hours as needed for moderate pain. 30 tablet 0  . HYDROcodone-acetaminophen (NORCO) 7.5-325 MG tablet Take 1 tablet by mouth every 6 (six) hours as needed for moderate pain. 30 tablet 0  . ipratropium-albuterol (DUONEB) 0.5-2.5 (3) MG/3ML SOLN Take 3 mLs by nebulization every 6 (six) hours as needed. Dx: J44.9 360 mL 1  . losartan (COZAAR) 25 MG tablet Take 1 tablet (25 mg total) by mouth daily. 90 tablet 1  . nitroGLYCERIN (NITROSTAT) 0.4 MG SL tablet Place 1 tablet (0.4 mg total) under the tongue every 5 (five) minutes as needed for chest pain. 25 tablet 3  . PARoxetine (PAXIL) 30 MG tablet Take 1 tablet (30 mg total) by mouth daily. 90 tablet 0  . Spacer/Aero Chamber Mouthpiece MISC 1 Device by Does not apply route as directed. 1 each 0  . Tiotropium Bromide Monohydrate (SPIRIVA RESPIMAT) 2.5 MCG/ACT AERS  USE 2 INHALATIONS BY MOUTH  DAILY (Patient taking differently: Inhale 2 puffs into the lungs daily. ) 12 g 3  . vitamin B-12 (CYANOCOBALAMIN) 500 MCG tablet Take 500 mcg by mouth 2 (two) times daily.     No facility-administered medications prior to visit.    Review of Systems  Review of Systems  Constitutional: Negative.   Respiratory: Positive for cough, shortness of breath and wheezing.   Cardiovascular: Negative.   Psychiatric/Behavioral: Positive for sleep disturbance.    Physical Exam  BP 130/74 (BP Location: Left Arm, Cuff Size: Normal)   Pulse 96   Temp 97.6 F (36.4 C) (Temporal)   Ht 5\' 3"  (1.6 m)   Wt 129 lb (58.5 kg)   SpO2 96%   BMI  22.85 kg/m  Physical Exam Constitutional:      Appearance: Normal appearance.  HENT:     Head: Normocephalic and atraumatic.     Right Ear: Tympanic membrane normal.     Left Ear: Tympanic membrane normal.     Mouth/Throat:     Mouth: Mucous membranes are moist.     Pharynx: Oropharynx is clear.  Cardiovascular:     Rate and Rhythm: Normal rate and regular rhythm.  Pulmonary:     Breath sounds: Wheezing present.     Comments: Expiratory wheezing throughout; on 2L pulsed oxygen  Neurological:     General: No focal deficit present.     Mental Status: She is alert and oriented to person, place, and time. Mental status is at baseline.  Psychiatric:        Mood and Affect: Mood normal.        Behavior: Behavior normal.        Thought Content: Thought content normal.        Judgment: Judgment normal.      Lab Results:  CBC    Component Value Date/Time   WBC 14.3 (H) 07/13/2019 0403   RBC 4.31 07/13/2019 0403   HGB 13.4 07/13/2019 0403   HGB 14.0 08/26/2016 1058   HCT 42.5 07/13/2019 0403   HCT 42.6 08/26/2016 1058   PLT 271 07/13/2019 0403   PLT 297 08/26/2016 1058   MCV 98.6 07/13/2019 0403   MCV 92 08/26/2016 1058   MCH 31.1 07/13/2019 0403   MCHC 31.5 07/13/2019 0403   RDW 13.6 07/13/2019 0403   RDW 14.1 08/26/2016 1058   LYMPHSABS 7.5 (H) 07/10/2019 1915   MONOABS 1.2 (H) 07/10/2019 1915   EOSABS 0.9 (H) 07/10/2019 1915   BASOSABS 0.2 (H) 07/10/2019 1915    BMET    Component Value Date/Time   NA 142 07/13/2019 0403   NA 146 (H) 07/01/2019 1524   K 5.0 07/13/2019 0403   CL 104 07/13/2019 0403   CO2 26 07/13/2019 0403   GLUCOSE 154 (H) 07/13/2019 0403   BUN 19 07/13/2019 0403   BUN 13 07/01/2019 1524   CREATININE 0.87 07/13/2019 0403   CREATININE 1.16 (H) 07/19/2015 1447   CALCIUM 9.9 07/13/2019 0403   GFRNONAA >60 07/13/2019 0403   GFRAA >60 07/13/2019 0403    BNP    Component Value Date/Time   BNP 42.4 07/10/2019 1915    ProBNP    Component  Value Date/Time   PROBNP 43 08/26/2016 1058    Imaging: No results found.   Assessment & Plan:   COPD GOLD III  - Symptoms consistent with severe COPD. Frequent exacerbations. NO benefit from Trelegy 200.  - Treating for acute COPD exacerbation  with azithromycin x 5 days and extended prednisone taper (60mg  x 3 days; 50mg  x 3 days; 40mg  x 3 days; 30mg  x 3 days; 20mg  x 3 days; 10mg  x 3 days) - Continue Symbicort 160 + Spiriva Respimat 2.84mcg - Follow-up in 7-10 days to discuss starting daliresp sample    Martyn Ehrich, NP 08/12/2019

## 2019-08-12 NOTE — Addendum Note (Signed)
Addended by: Gavin Potters R on: 08/12/2019 01:27 PM   Modules accepted: Orders

## 2019-08-19 ENCOUNTER — Ambulatory Visit (INDEPENDENT_AMBULATORY_CARE_PROVIDER_SITE_OTHER): Payer: Medicare Other | Admitting: Primary Care

## 2019-08-19 ENCOUNTER — Other Ambulatory Visit: Payer: Self-pay

## 2019-08-19 ENCOUNTER — Telehealth: Payer: Self-pay | Admitting: Primary Care

## 2019-08-19 ENCOUNTER — Encounter: Payer: Self-pay | Admitting: Primary Care

## 2019-08-19 DIAGNOSIS — J449 Chronic obstructive pulmonary disease, unspecified: Secondary | ICD-10-CM

## 2019-08-19 NOTE — Telephone Encounter (Signed)
Needs prior auto for Daliresp

## 2019-08-19 NOTE — Patient Instructions (Addendum)
Recommendations: Continue Symbicort 2 puffs twice daily and Spiriva Start daliresp 250mg  daily x 4 weeks BRAT diet and bland food if you have any GI symptoms with medication  Recommend you follow-up with primary care cardiology regarding your blood pressure  Follow-up: 3-4 weeks with Dr. Elsworth Soho or APP

## 2019-08-19 NOTE — Progress Notes (Signed)
@Patient  ID: Kim [name], female    DOB: January 30, 1956, 64 y.o.   MRN: [id number]  Chief Complaint  Patient presents with  . Follow-up    pt states congestion. pt down to 4 predinose a day. pt states mucus is brown in color.pt is here to discuss taking daliresp    Referring provider: [name], NP  HPI:  64 year old female, former smoker quit 2015 (76 pack year hx). PMH significant for COPD GOLD III, chronic respiratory failure, allergic rhinitis, coronary artery calcification, HLD, benign neoplasm ascending colon. Patient of Dr. Elsworth Soho. Maintained on Symbicort 160 and Spiriva Respimat. 2.74mcg. Eosinophils 900 in April 2021.   Previous [facility] pulmonary encounter: 05/13/2019 Patient contacted today for acute televisit. Reports wheezing and congested cough x 2 days. States that she did initially improve after completing doxycycline and prednisone taper. Her cough is thick with beige/white mucus. She has used her Duoneb twice today with minimal improvement in wheezing.  States that she does better with azithromycin antibiotic. Denies sick contact. COVID negative on 04/27/19. Declining ED eval.  05/17/2019 Patient contacted today for 4 day follow-up COPD exacerbation. Shortness of breath, wheezing and coughing symptoms have been persistent off/on for three months. She does feel 25% better since telephone visit on Friday. She completed Zpack today and has three days of prednisone taper left. Continues to have moderate amount of wheezing and productive cough with extremely thick clear/beige mucus. She is taking 1,200mg  mucinex twice and using duoneb 3-4 times a day followed by her flutter valve. She has panic attacks with coughing fits. Takes xanax as needed. LDCT in September centrilobular and paraseptal emphysema. Reports that she does better with higher dose prednisone taper from Dr. Ashok Cordia. No history of diabetes. She was Covid negative two weeks. She lives with her husband, she has not left her  house. Denies fever, chills, sweats, HA, N/V/D.   06/03/2019 Patient presents today for 2 week follow-up. She is feeling much better. Completed extended prednisone taper. She still has some wheezing which is baseline for her. She did not take her inhalers this morning but reports compliance with them. Needs refill of Albuterol, uses her rescue inhaler 3-4 times a day. Chronic cough. Some rare relux related to diet. Uses her oxygen only as needed, brought it with her today but she is not wearing it. O2 93% on RA. 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   08/12/2019 Patient presents today for acute visit. She was in the hospital for three days in April for COPD exacerbation. Treated with IV steriods and discharged with prednisone taper. CXR showed no acute process.  Reports increased shortness of breath, productive cough x 3 days. Associated wheezing. Cough is productive with beige to yellow mucus. She is taking mucinex twice a day. Resumed to Symbicort and Spiriva. No perceived benefit from Trelegy 200. Not sleeping because of cough. She is taking Norco 1 tablet every 6 hours for pain and for cough suppression.    08/19/2019 Patient presents today for 1 week follow-up visit. Accompanied by her husband. She is feeling better. Still coughing up colored mucus. Completed azithromycin course. She is on prednisone taper 40mg  today. Her shortness of breath and wheezing have improved. She checked with [facility] and needs prior authorization for Daliresp and will then cost around $47. Blood pressure varies between hypertensive and hypotensive. Denies fever, chills, chest tightness or chest pain.   COPD exacerbations: June 2020- COPD exacerbation/Prednisone + zpack September- prednisone PCP  December 2020- Prednisone + zpack ? PCP January 18th 2021- COPD exacerbation/Prednisone + doxycycline  February 5th 2021- COPD exacerbation/Prednisone + Zpack  February 9th 2021- Prolonged COPD exacerbation  requiring high dose prednisone/ CXR ordered  April 4th- Hospitalized for COPD exacerbation treated with IV steriods and oral prednisone taper 08/12/2019 - Zpack and extrended prednisone taper   Allergies  Allergen Reactions  . Ciprofloxacin     Hallucinations   . Lisinopril     cough  . Penicillins Hives    Has patient had a PCN reaction causing immediate rash, facial/tongue/throat swelling, SOB or lightheadedness with hypotension: Yes Has patient had a PCN reaction causing severe rash involving mucus membranes or skin necrosis: No Has patient had a PCN reaction that required hospitalization No Has patient had a PCN reaction occurring within the last 10 years: No If all of the above answers are "NO", then may proceed with Cephalosporin use.     Immunization History  Administered Date(s) Administered  . Influenza Split 01/31/2011, 01/27/2012, 03/25/2014  . Influenza Whole 01/05/2008, 03/12/2009, 03/13/2010  . Influenza,inj,Quad PF,6+ Mos 12/29/2012, 12/20/2014, 01/07/2017, 12/17/2017, 12/17/2018  . Influenza-Unspecified 02/11/2016  . PFIZER SARS-COV-2 Vaccination 06/29/2019, 07/25/2019  . Pneumococcal Conjugate-13 08/10/2015  . Pneumococcal Polysaccharide-23 05/08/2006  . Tdap 01/31/2011    Past Medical History:  Diagnosis Date  . C8 RADICULOPATHY 06/05/2009  . COPD 08/31/2007  . DEPRESSION 11/19/2006  . DYSLIPIDEMIA 03/12/2009  . MI (mitral incompetence)   . 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 OSTEOARTHRITIS 06/05/2009  . PARESTHESIA 08/24/2007  . TOBACCO ABUSE 01/25/2010    Tobacco History: Social History   Tobacco Use  Smoking Status Former Smoker  . Packs/day: 2.00  . Years: 38.00  . Pack years: 76.00  . Types: Cigarettes  . Quit date: 03/07/2014  . Years since quitting: 5.4  Smokeless Tobacco Never Used   Counseling given: Not Answered   Outpatient Medications Prior to Visit  Medication Sig Dispense Refill  . albuterol (VENTOLIN HFA) 108 (90 Base) MCG/ACT inhaler Take  two puffs every 4-6 hours as needed for shortness of breath/wheezing (Patient taking differently: Inhale 2 puffs into the lungs every 6 (six) hours as needed for wheezing or shortness of breath. ) 54 g 1  . ALPRAZolam (XANAX) 0.5 MG tablet TAKE 1/2 TO 1 TABLET BY MOUTH THREE TIMES DAILY (Patient taking differently: Take 0.5 mg by mouth 3 (three) times daily. ) 60 tablet 2  . aspirin EC 81 MG EC tablet Take 1 tablet (81 mg total) by mouth daily.    Marland Kitchen azithromycin (ZITHROMAX) 250 MG tablet Zpack taper as directed 6 tablet 0  . budesonide-formoterol (SYMBICORT) 160-4.5 MCG/ACT inhaler Inhale 2 puffs into the lungs 2 (two) times daily. 30.6 g 2  . Cholecalciferol (VITAMIN D3) 1000 units CAPS Take 1 capsule (1,000 Units total) by mouth 2 (two) times daily. 60 capsule 6  . furosemide (LASIX) 40 MG tablet Take 1 tablet (40 mg total) by mouth daily as needed (for weight gain of 3 lbs). 90 tablet 1  . HYDROcodone-acetaminophen (NORCO) 7.5-325 MG tablet Take 1 tablet by mouth every 6 (six) hours as needed for moderate pain. 45 tablet 0  . HYDROcodone-acetaminophen (NORCO) 7.5-325 MG tablet Take 1 tablet by mouth every 6 (six) hours as needed for moderate pain. 30 tablet 0  . HYDROcodone-acetaminophen (NORCO) 7.5-325 MG tablet Take 1 tablet by mouth every 6 (six) hours as needed for moderate pain. 30 tablet 0  . ipratropium-albuterol (DUONEB) 0.5-2.5 (3) MG/3ML SOLN Take 3 mLs  by nebulization every 6 (six) hours as needed. Dx: J44.9 360 mL 1  . losartan (COZAAR) 25 MG tablet Take 1 tablet (25 mg total) by mouth daily. 90 tablet 1  . nitroGLYCERIN (NITROSTAT) 0.4 MG SL tablet Place 1 tablet (0.4 mg total) under the tongue every 5 (five) minutes as needed for chest pain. 25 tablet 3  . PARoxetine (PAXIL) 30 MG tablet Take 1 tablet (30 mg total) by mouth daily. 90 tablet 0  . predniSONE (DELTASONE) 10 MG tablet Take 6 tabs qday x 3 days; then 5 tabs x 3 days; 4 tabs  x 3 days; 3 tabs daily x3 days; then 2 tabs daily  x3 days; then 1 tab daily x 3 days; then stop 63 tablet 0  . roflumilast (DALIRESP) 500 MCG TABS tablet Take 0.5 tablets (250 mcg total) by mouth daily. 28 tablet 0  . Spacer/Aero Chamber Mouthpiece MISC 1 Device by Does not apply route as directed. 1 each 0  . Tiotropium Bromide Monohydrate (SPIRIVA RESPIMAT) 2.5 MCG/ACT AERS USE 2 INHALATIONS BY MOUTH  DAILY (Patient taking differently: Inhale 2 puffs into the lungs daily. ) 12 g 3  . vitamin B-12 (CYANOCOBALAMIN) 500 MCG tablet Take 500 mcg by mouth 2 (two) times daily.     No facility-administered medications prior to visit.    Review of Systems  Review of Systems  Constitutional: Negative.   Respiratory: Positive for cough. Negative for chest tightness, shortness of breath and wheezing.   Cardiovascular: Negative.     Physical Exam  BP (!) 150/82 (BP Location: Right Arm, Cuff Size: Normal)   Pulse 86   Temp 98.5 F (36.9 C) (Temporal)   Ht 5\' 3"  (1.6 m)   Wt 129 lb 12.8 oz (58.9 kg)   SpO2 98%   BMI 22.99 kg/m  Physical Exam Constitutional:      Appearance: Normal appearance.  HENT:     Head: Normocephalic and atraumatic.  Cardiovascular:     Rate and Rhythm: Normal rate and regular rhythm.  Pulmonary:     Effort: Pulmonary effort is normal.     Breath sounds: Normal breath sounds.     Comments: Mostly clear, faint wheezing Neurological:     General: No focal deficit present.     Mental Status: She is alert and oriented to person, place, and time. Mental status is at baseline.  Psychiatric:        Mood and Affect: Mood normal.        Behavior: Behavior normal.        Thought Content: Thought content normal.        Judgment: Judgment normal.      Lab Results:  CBC    Component Value Date/Time   WBC 14.3 (H) 07/13/2019 0403   RBC 4.31 07/13/2019 0403   HGB 13.4 07/13/2019 0403   HGB 14.0 08/26/2016 1058   HCT 42.5 07/13/2019 0403   HCT 42.6 08/26/2016 1058   PLT 271 07/13/2019 0403   PLT 297 08/26/2016  1058   MCV 98.6 07/13/2019 0403   MCV 92 08/26/2016 1058   MCH 31.1 07/13/2019 0403   MCHC 31.5 07/13/2019 0403   RDW 13.6 07/13/2019 0403   RDW 14.1 08/26/2016 1058   LYMPHSABS 7.5 (H) 07/10/2019 1915   MONOABS 1.2 (H) 07/10/2019 1915   EOSABS 0.9 (H) 07/10/2019 1915   BASOSABS 0.2 (H) 07/10/2019 1915    BMET    Component Value Date/Time   NA 142 07/13/2019 0403  NA 146 (H) 07/01/2019 1524   K 5.0 07/13/2019 0403   CL 104 07/13/2019 0403   CO2 26 07/13/2019 0403   GLUCOSE 154 (H) 07/13/2019 0403   BUN 19 07/13/2019 0403   BUN 13 07/01/2019 1524   CREATININE 0.87 07/13/2019 0403   CREATININE 1.16 (H) 07/19/2015 1447   CALCIUM 9.9 07/13/2019 0403   GFRNONAA >60 07/13/2019 0403   GFRAA >60 07/13/2019 0403    BNP    Component Value Date/Time   BNP 42.4 07/10/2019 1915    ProBNP    Component Value Date/Time   PROBNP 43 08/26/2016 1058    Imaging: No results found.   Assessment & Plan:   COPD GOLD III  - Frequent exacerbations. No benefit from Trelegy 200.  - Continue Symbicort 160 2 puffs twice daily and Spiriva Respimat 2.17mcg daily  - Continue Prednsione taper until completed  - Start daliresp 236mcg daily x 4 weeks; then if tolerates increase 539mcg daily  - FU in 3-4 weeks    Martyn Ehrich, NP 08/19/2019

## 2019-08-19 NOTE — Assessment & Plan Note (Addendum)
-   Frequent exacerbations. No benefit from Trelegy 200.  - Continue Symbicort 160 2 puffs twice daily and Spiriva Respimat 2.77mcg daily  - Continue Prednsione taper until completed  - Start daliresp 210mcg daily x 4 weeks; then if tolerates increase 534mcg daily  - FU in 3-4 weeks

## 2019-08-23 ENCOUNTER — Other Ambulatory Visit: Payer: Self-pay | Admitting: Family Medicine

## 2019-08-23 DIAGNOSIS — F32A Depression, unspecified: Secondary | ICD-10-CM

## 2019-08-23 MED ORDER — ALPRAZOLAM 0.5 MG PO TABS: ORAL_TABLET | ORAL | 2 refills | Status: DC

## 2019-09-16 ENCOUNTER — Telehealth: Payer: Self-pay | Admitting: Pulmonary Disease

## 2019-09-16 ENCOUNTER — Encounter: Payer: Self-pay | Admitting: Adult Health

## 2019-09-16 NOTE — Telephone Encounter (Signed)
Left message for patient to call back  

## 2019-09-17 ENCOUNTER — Emergency Department (HOSPITAL_COMMUNITY): Payer: Medicare Other

## 2019-09-17 ENCOUNTER — Other Ambulatory Visit: Payer: Self-pay

## 2019-09-17 ENCOUNTER — Encounter (HOSPITAL_COMMUNITY): Payer: Self-pay

## 2019-09-17 ENCOUNTER — Emergency Department (HOSPITAL_COMMUNITY)
Admission: EM | Admit: 2019-09-17 | Discharge: 2019-09-17 | Disposition: A | Discharge status: Home / Self Care | Payer: Medicare Other | Attending: Emergency Medicine | Admitting: Emergency Medicine

## 2019-09-17 DIAGNOSIS — J441 Chronic obstructive pulmonary disease with (acute) exacerbation: Secondary | ICD-10-CM | POA: Diagnosis not present

## 2019-09-17 DIAGNOSIS — Z87891 Personal history of nicotine dependence: Secondary | ICD-10-CM | POA: Insufficient documentation

## 2019-09-17 DIAGNOSIS — Z20822 Contact with and (suspected) exposure to covid-19: Secondary | ICD-10-CM | POA: Diagnosis not present

## 2019-09-17 DIAGNOSIS — I2510 Atherosclerotic heart disease of native coronary artery without angina pectoris: Secondary | ICD-10-CM | POA: Diagnosis not present

## 2019-09-17 DIAGNOSIS — Z79899 Other long term (current) drug therapy: Secondary | ICD-10-CM | POA: Insufficient documentation

## 2019-09-17 DIAGNOSIS — Z7982 Long term (current) use of aspirin: Secondary | ICD-10-CM | POA: Diagnosis not present

## 2019-09-17 DIAGNOSIS — R0602 Shortness of breath: Secondary | ICD-10-CM | POA: Diagnosis present

## 2019-09-17 LAB — I-STAT VENOUS BLOOD GAS, ED
Acid-Base Excess: 3 mmol/L — ABNORMAL HIGH (ref 0.0–2.0)
Bicarbonate: 28.5 mmol/L — ABNORMAL HIGH (ref 20.0–28.0)
Calcium, Ion: 1.2 mmol/L (ref 1.15–1.40)
HCT: 40 % (ref 36.0–46.0)
Hemoglobin: 13.6 g/dL (ref 12.0–15.0)
O2 Saturation: 100 %
Potassium: 3.9 mmol/L (ref 3.5–5.1)
Sodium: 142 mmol/L (ref 135–145)
TCO2: 30 mmol/L (ref 22–32)
pCO2, Ven: 43.6 mmHg — ABNORMAL LOW (ref 44.0–60.0)
pH, Ven: 7.423 (ref 7.250–7.430)
pO2, Ven: 192 mmHg — ABNORMAL HIGH (ref 32.0–45.0)

## 2019-09-17 LAB — CBC WITH DIFFERENTIAL/PLATELET
Abs Immature Granulocytes: 0.03 10*3/uL (ref 0.00–0.07)
Basophils Absolute: 0.1 10*3/uL (ref 0.0–0.1)
Basophils Relative: 1 %
Eosinophils Absolute: 0.2 10*3/uL (ref 0.0–0.5)
Eosinophils Relative: 3 %
HCT: 41.5 % (ref 36.0–46.0)
Hemoglobin: 13 g/dL (ref 12.0–15.0)
Immature Granulocytes: 0 %
Lymphocytes Relative: 22 %
Lymphs Abs: 1.9 10*3/uL (ref 0.7–4.0)
MCH: 30.7 pg (ref 26.0–34.0)
MCHC: 31.3 g/dL (ref 30.0–36.0)
MCV: 97.9 fL (ref 80.0–100.0)
Monocytes Absolute: 0.4 10*3/uL (ref 0.1–1.0)
Monocytes Relative: 5 %
Neutro Abs: 5.8 10*3/uL (ref 1.7–7.7)
Neutrophils Relative %: 69 %
Platelets: 367 10*3/uL (ref 150–400)
RBC: 4.24 MIL/uL (ref 3.87–5.11)
RDW: 13.2 % (ref 11.5–15.5)
WBC: 8.5 10*3/uL (ref 4.0–10.5)
nRBC: 0 % (ref 0.0–0.2)

## 2019-09-17 LAB — SARS CORONAVIRUS 2 BY RT PCR (HOSPITAL ORDER, PERFORMED IN ~~LOC~~ HOSPITAL LAB): SARS Coronavirus 2: NEGATIVE

## 2019-09-17 LAB — BRAIN NATRIURETIC PEPTIDE: B Natriuretic Peptide: 52.5 pg/mL (ref 0.0–100.0)

## 2019-09-17 LAB — TROPONIN I (HIGH SENSITIVITY): Troponin I (High Sensitivity): 3 ng/L (ref <18)

## 2019-09-17 LAB — BASIC METABOLIC PANEL
Anion gap: 11 (ref 5–15)
BUN: 11 mg/dL (ref 8–23)
CO2: 25 mmol/L (ref 22–32)
Calcium: 9.5 mg/dL (ref 8.9–10.3)
Chloride: 105 mmol/L (ref 98–111)
Creatinine, Ser: 0.65 mg/dL (ref 0.44–1.00)
GFR calc Af Amer: >60 mL/min (ref >60)
GFR calc non Af Amer: >60 mL/min (ref >60)
Glucose, Bld: 108 mg/dL — ABNORMAL HIGH (ref 70–99)
Potassium: 3.8 mmol/L (ref 3.5–5.1)
Sodium: 141 mmol/L (ref 135–145)

## 2019-09-17 MED ORDER — DOXYCYCLINE HYCLATE 100 MG PO CAPS
100.0000 mg | ORAL_CAPSULE | Freq: Two times a day (BID) | ORAL | 0 refills | Status: AC
Start: 2019-09-17 — End: 2019-09-24

## 2019-09-17 MED ORDER — METHYLPREDNISOLONE SODIUM SUCC 125 MG IJ SOLR: 60.0000 mg | Freq: Once | INTRAMUSCULAR | Status: DC

## 2019-09-17 MED ORDER — PREDNISONE 10 MG PO TABS
ORAL_TABLET | ORAL | 0 refills | Status: DC
Start: 2019-09-18 — End: 2019-10-03

## 2019-09-17 MED ORDER — IPRATROPIUM-ALBUTEROL 0.5-2.5 (3) MG/3ML IN SOLN
3.0000 mL | Freq: Three times a day (TID) | RESPIRATORY_TRACT | Status: DC | PRN
  Administered 2019-09-17 (×3): 3 mL via RESPIRATORY_TRACT
  Filled 2019-09-17: qty 9

## 2019-09-17 NOTE — ED Provider Notes (Signed)
[facility] Provider Note   CSN: [id number] Arrival date & time: 09/17/19  1511     History Chief Complaint  Patient presents with  . Shortness of Breath    Kim [name] is a 64 y.o. 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  Reports new and productive cough for 4 days, worsening.  Also feeling SOB despite breathing treatments.  Last steroid course was in may.  She tried breathing treatment x 1 today at home with minimal improvement.   Patient received IV solumedrol 125 mg by EMS prior to arrival  Last echo 07/11/19 with EF 38-25%, Grade I diastolic dysfunction  On lasxi 40 mg daily  She did receive both doses of the Covid vaccine.  Denies fevers at home.   HPI     Past Medical History:  Diagnosis Date  . C8 RADICULOPATHY 06/05/2009  . COPD 08/31/2007  . DEPRESSION 11/19/2006  . DYSLIPIDEMIA 03/12/2009  . MI (mitral incompetence)   . 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 OSTEOARTHRITIS 06/05/2009  . PARESTHESIA 08/24/2007  . TOBACCO ABUSE 01/25/2010    Patient Active Problem List   Diagnosis Date Noted  . COPD with acute exacerbation ([place]) 07/10/2019  . History of colonic polyps   . Benign neoplasm of ascending colon   . Neuritis 01/29/2017  . Alpha-1-antitrypsin deficiency carrier 01/19/2017  . Dependent edema 01/14/2017  . Allergic rhinitis 08/10/2015  . Chronic respiratory failure ([place]) 07/05/2015  . Hyperglycemia   . HLD (hyperlipidemia)   . Coronary artery calcification seen on CAT scan 07/02/2015  . Solitary pulmonary nodule 12/02/2013  . Loss of weight 11/10/2013  . Osteoarthritis 06/05/2009  . Brachial neuritis or radiculitis 06/05/2009  . Dyslipidemia 03/12/2009  . 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 COPD GOLD III  08/31/2007  . PARESTHESIA 08/24/2007  . Anxiety and depression 11/19/2006    Past Surgical History:  Procedure Laterality Date  .  ABDOMINAL HYSTERECTOMY    . APPENDECTOMY  1969  . CARDIAC CATHETERIZATION N/A 07/05/2015   Procedure: Left Heart Cath and Coronary Angiography;  Surgeon: Leonie Man, MD;  Location: [facility];  Service: Cardiovascular;  Laterality: N/A;  . CHOLECYSTECTOMY  1989  . collapse lung  1990  . COLONOSCOPY WITH PROPOFOL N/A 05/14/2018   Procedure: COLONOSCOPY WITH PROPOFOL;  Surgeon: Irene Shipper, MD;  Location: [facility];  Service: Endoscopy;  Laterality: N/A;  . EXCISION MASS ABDOMINAL  2019  . EXPLORATORY LAPAROTOMY  1969  . POLYPECTOMY  05/14/2018   Procedure: POLYPECTOMY;  Surgeon: Irene Shipper, MD;  Location: [facility];  Service: Endoscopy;;  . SALPINGOOPHORECTOMY  2019     OB History   No obstetric history on file.     Family History  Problem Relation Age of Onset  . Cancer Mother        lung  . Heart attack Mother   . Hypertension Sister   . Multiple sclerosis Brother   . Diabetes Sister   . Rheum arthritis Sister   . Thyroid disease Sister   . Multiple sclerosis Other   . Alcohol abuse Other   . Arthritis Other   . Diabetes Other   . Kidney disease Other   . Cancer Other        lung,ovarian,skin, uterine  . Stroke Other   . Heart disease Other   . Melanoma Other   . Heart attack Maternal Uncle   .  Heart attack Other        NEICE  . Heart attack Other        NEPHEW  . Hypertension Brother   . Stroke Maternal Grandmother   . Colon cancer Neg Hx     Social History   Tobacco Use  . Smoking status: Former Smoker    Packs/day: 2.00    Years: 38.00    Pack years: 76.00    Types: Cigarettes    Quit date: 03/07/2014    Years since quitting: 5.5  . Smokeless tobacco: Never Used  Vaping Use  . Vaping Use: Never used  Substance Use Topics  . Alcohol use: No    Alcohol/week: 0.0 standard drinks  . Drug use: No    Home Medications Prior to Admission medications   Medication Sig Start Date End Date Taking? Authorizing Provider  albuterol  (VENTOLIN HFA) 108 (90 Base) MCG/ACT inhaler Take two puffs every 4-6 hours as needed for shortness of breath/wheezing Patient taking differently: Inhale 2 puffs into the lungs every 6 (six) hours as needed for wheezing or shortness of breath.  06/03/19  Yes Martyn Ehrich, NP  ALPRAZolam Duanne Moron) 0.5 MG tablet TAKE 1/2 TO 1 TABLET BY MOUTH THREE TIMES DAILY Patient taking differently: Take 0.5 mg by mouth 3 (three) times daily as needed for anxiety.  08/23/19  Yes Nafziger, Tommi Rumps, NP  Ascorbic Acid (VITAMIN C PO) Take 1 tablet by mouth daily.   Yes [provider]  aspirin EC 81 MG EC tablet Take 1 tablet (81 mg total) by mouth daily. 07/08/15  Yes Nita Sells, MD  budesonide-formoterol (SYMBICORT) 160-4.5 MCG/ACT inhaler Inhale 2 puffs into the lungs 2 (two) times daily. 03/16/18  Yes [name], MD  Cholecalciferol (VITAMIN D3) 50 MCG (2000 UT) TABS Take 2,000 Units by mouth daily.   Yes [provider]  cyanocobalamin 2000 MCG tablet Take 2,000 mcg by mouth daily. Vitamin B12   Yes [provider]  furosemide (LASIX) 40 MG tablet Take 1 tablet (40 mg total) by mouth daily as needed (for weight gain of 3 lbs). Patient taking differently: Take 20-40 mg by mouth daily as needed (for overnight weight gain of 3 lbs 1/2 tablet (20 mg), for overnight weight gain of 5 lbs 1 tablet (40 mg)).  07/01/19  Yes Tommie Raymond, NP  Ginkgo Biloba Extract 60 MG CAPS Take 60 mg by mouth daily.   Yes [provider]  HYDROcodone-acetaminophen (NORCO) 7.5-325 MG tablet Take 1 tablet by mouth every 6 (six) hours as needed for moderate pain. Patient taking differently: Take 1 tablet by mouth 2 (two) times daily as needed (pain/cough).  07/26/19  Yes Nafziger, Tommi Rumps, NP  ipratropium-albuterol (DUONEB) 0.5-2.5 (3) MG/3ML SOLN Take 3 mLs by nebulization every 6 (six) hours as needed. Dx: J44.9 Patient taking differently: Take 3 mLs by nebulization every 6 (six) hours as needed  (shortness of breath/wheezing). Dx: J44.9 05/31/19  Yes [name], MD  losartan (COZAAR) 25 MG tablet Take 1 tablet (25 mg total) by mouth daily. 07/01/19  Yes Kathyrn Drown D, NP  nitroGLYCERIN (NITROSTAT) 0.4 MG SL tablet Place 1 tablet (0.4 mg total) under the tongue every 5 (five) minutes as needed for chest pain. 08/09/18  Yes Simmons, Brittainy M, PA-C  OXYGEN Inhale 2 L into the lungs continuous.   Yes [provider]  PARoxetine (PAXIL-CR) 25 MG 24 hr tablet Take 25 mg by mouth daily.   Yes [provider]  Tiotropium Bromide Monohydrate (SPIRIVA RESPIMAT) 2.5 MCG/ACT AERS USE 2 INHALATIONS BY MOUTH  DAILY Patient taking differently: Inhale 2 puffs into the lungs daily at 12 noon.  03/29/19  Yes Nafziger, Tommi Rumps, NP  doxycycline (VIBRAMYCIN) 100 MG capsule Take 1 capsule (100 mg total) by mouth 2 (two) times daily for 7 days. 09/17/19 09/24/19  Wyvonnia Dusky, MD  HYDROcodone-acetaminophen (NORCO) 7.5-325 MG tablet Take 1 tablet by mouth every 6 (six) hours as needed for moderate pain. Patient not taking: Reported on 09/17/2019 07/26/19   [name], NP  HYDROcodone-acetaminophen (NORCO) 7.5-325 MG tablet Take 1 tablet by mouth every 6 (six) hours as needed for moderate pain. Patient not taking: Reported on 09/17/2019 07/26/19   [name], NP  PARoxetine (PAXIL) 30 MG tablet Take 1 tablet (30 mg total) by mouth daily. 07/21/19   Nafziger, Tommi Rumps, NP  predniSONE (DELTASONE) 10 MG tablet Take 6 tablets (60 mg total) by mouth daily for 3 days, THEN 5 tablets (50 mg total) daily for 3 days, THEN 4 tablets (40 mg total) daily for 3 days, THEN 3 tablets (30 mg total) daily for 3 days, THEN 2 tablets (20 mg total) daily for 3 days, THEN 1 tablet (10 mg total) daily for 3 days. 09/18/19 10/06/19  Wyvonnia Dusky, MD  roflumilast (DALIRESP) 500 MCG TABS tablet Take 0.5 tablets (250 mcg total) by mouth daily. 08/12/19   Martyn Ehrich, NP  Spacer/Aero Chamber Mouthpiece MISC 1  Device by Does not apply route as directed. 01/12/17   Javier Glazier, MD    Allergies    Ciprofloxacin, Lisinopril, and Penicillins  Review of Systems   Review of Systems  Constitutional: Negative for chills and fever.  HENT: Negative for ear pain and sore throat.   Eyes: Negative for pain and visual disturbance.  Respiratory: Positive for cough and shortness of breath.   Cardiovascular: Negative for chest pain and palpitations.  Gastrointestinal: Negative for abdominal pain and vomiting.  Genitourinary: Negative for dysuria and hematuria.  Musculoskeletal: Negative for arthralgias and back pain.  Skin: Negative for color change and rash.  Neurological: Negative for seizures and syncope.  All other systems reviewed and are negative.   Physical Exam Updated Vital Signs BP 116/69 (BP Location: Left Arm)   Pulse 97   Temp 98.3 F (36.8 C) (Oral)   Resp 18   SpO2 97%   Physical Exam Vitals and nursing note reviewed.  Constitutional:      General: She is not in acute distress.    Appearance: She is well-developed.  HENT:     Head: Normocephalic and atraumatic.  Eyes:     Conjunctiva/sclera: Conjunctivae normal.  Cardiovascular:     Rate and Rhythm: Normal rate and regular rhythm.     Pulses: Normal pulses.  Pulmonary:     Effort: Pulmonary effort is normal.     Breath sounds: Wheezing present.     Comments: 2L maintaing 97% O2 Speaking in short sentences, no retractions, pursed lips, no tripoding Abdominal:     Palpations: Abdomen is soft.     Tenderness: There is no abdominal tenderness.  Musculoskeletal:     Cervical back: Neck supple.  Skin:    General: Skin is warm and dry.  Neurological:     General: No focal deficit present.     Mental Status: She is alert and oriented to person, place, and time.  Psychiatric:        Mood and Affect: Mood  normal.        Behavior: Behavior normal.     ED Results / Procedures / Treatments   Labs (all labs ordered  are listed, but only abnormal results are displayed) Labs Reviewed  BASIC METABOLIC PANEL - Abnormal; Notable for the following components:      Result Value   Glucose, Bld 108 (*)    All other components within normal limits  I-STAT VENOUS BLOOD GAS, ED - Abnormal; Notable for the following components:   pCO2, Ven 43.6 (*)    pO2, Ven 192.0 (*)    Bicarbonate 28.5 (*)    Acid-Base Excess 3.0 (*)    All other components within normal limits  SARS CORONAVIRUS 2 BY RT PCR (HOSPITAL ORDER, [facility])  CBC WITH DIFFERENTIAL/PLATELET  BRAIN NATRIURETIC PEPTIDE  TROPONIN I (HIGH SENSITIVITY)    EKG EKG Interpretation  Date/Time:  Saturday September 17 2019 15:13:26 EDT Ventricular Rate:  90 PR Interval:    QRS Duration: 78 QT Interval:  348 QTC Calculation: 426 R Axis:   75 Text Interpretation: Sinus rhythm Ventricular premature complex Aberrant conduction of SV complex(es) Probable anterior infarct, old No STEMI, no significant change from prior ecg, Q waves in anterior leads noted on prior ecgs Confirmed by Octaviano Glow [phone]) on 09/17/2019 3:30:32 PM   Radiology DG Chest 2 View  Result Date: 09/17/2019 CLINICAL DATA:  Shortness of breath.  Productive cough. EXAM: CHEST - 2 VIEW COMPARISON:  Radiograph 07/10/2019 FINDINGS: Chronic hyperinflation. Central bronchial thickening. No acute airspace disease. Heart is normal in size with normal mediastinal contours. No pleural effusion or pneumothorax. No pulmonary edema. No acute osseous abnormalities are seen. IMPRESSION: Chronic hyperinflation and central bronchial thickening. No pneumonia or acute airspace disease. Electronically Signed   By: Keith Rake M.D.   On: 09/17/2019 16:09    Procedures Procedures (including critical care time)  Medications Ordered in ED Medications  ipratropium-albuterol (DUONEB) 0.5-2.5 (3) MG/3ML nebulizer solution 3 mL (3 mLs Nebulization Given 09/17/19 1930)    ED  Course  I have reviewed the triage vital signs and the nursing notes.  Pertinent labs & imaging results that were available during my care of the patient were reviewed by me and considered in my medical decision making (see chart for details).  64 yo female w/ known brittle COPD presenting with wheezing, productive cough for 4 days.  She feels like this is another COPD exacerbation.  Also has grade 1 diastolic HF with a fairly unremarkable echo in April, 2 months ago, with preserved EF, and she is on lasix for this.  DDx includes likely COPD exacerbation and/or PNA, possible mild CHF component  Doubtful of sepsis or PE or ACS  Plan for labs, cardiac enzymes, ECG, xray chest Screening covid PCR ordered so that she can get the duonebs that she needs VBG pending  Medical chart reviewed including recent echo, pulm notes from office  Clinical Course as of Sep 18 7  Sat Sep 17, 2019  1529 ECG per my interpretation shows NSR, no right heart strain pattern, no STEMI, old q waves in anterior leads seen also on prior tracings.    [MT]  8119 Spoke to RT Jeneen Rinks now that labs are back, they will do duonebs, i'll reassess   [MT]  1951 Patient does have some improvement in her wheezing after 3 rounds of DuoNeb's.  She still has expiratory wheezing.  She is feeling better.  We discussed hospitalization for continued breathing  treatments versus management at home.  She does have a DuoNeb machine.  She prefers to go home with her husband's company.  I think is a reasonable plan is to do strongly suspect this is another COPD exacerbation.  She does have follow-up with her pulmonologist on Tuesday.  I advised that for the next 2 days she gave her self DuoNeb treatments every 4 hours at home.  We will also give her a prolonged course of prednisone as she has failed shorter courses in the past.  Advised that if she continues to struggle to breathe she should return to the ER.  Also advised she stay indoors in the  air conditioning short avoid going out in the high humidity, which appears to be a trigger for her.  Both she and her husband verbalized understanding.   [MT]  1956 Also discussed her x-ray findings likely bronchitis.  I doubt it is a bacterial pneumonia at this time.  She is no leukocytosis.  However she does feel like her cough is new and productive and worsening.  I will offer her a doxycycline prescription and watch and wait instructions.  If her cough continues to worsen or she has fevers, she can go ahead and begin taking this.   [MT]    Clinical Course User Index [MT] Jamari Moten, Carola Rhine, MD    Final Clinical Impression(s) / ED Diagnoses Final diagnoses:  COPD exacerbation ([place])    Rx / DC Orders ED Discharge Orders         Ordered    predniSONE (DELTASONE) 10 MG tablet     Discontinue  Reprint     09/17/19 1956    doxycycline (VIBRAMYCIN) 100 MG capsule  2 times daily     Discontinue  Reprint     09/17/19 1956           Wyvonnia Dusky, MD 09/18/19 0010

## 2019-09-17 NOTE — ED Triage Notes (Signed)
Pt BIB [facility] from urgent care c/o SHOB. Pt has a hx of COPD and is on 2L [place] at home. Pt started having SHOB yesterday that gradually got worse. Pt went to urgent care this afternoon. Pt was placed on 4L  to maintain oxygen sats of 90% or greater. Pt currently sating 97% on 4L. Pt denies any pain. Pt states she does have a productive cough with whitish/brown mucus.

## 2019-09-17 NOTE — Discharge Instructions (Addendum)
You are diagnosed with a COPD exacerbation in the ER today.  I advised that for the next 2 days, you give yourself DuoNeb breathing treatments every 4 hours at home.  After that you can drop down to twice per day.  And then go back to your normal schedule for breathing treatments.  Please follow-up with your pulmonologist on Tuesday as scheduled.  I also prescribed you an antibiotic and sent it to your pharmacy.  If you begin having fevers at home, her cough begins worsening, start taking this antibiotic.  It is called doxycycline.  I recommend that you stay indoors in the air conditioning and avoid being outside and humidity for the next several days until you have recovered.

## 2019-09-19 NOTE — Telephone Encounter (Signed)
Left message for patient to call back x2.  

## 2019-09-20 ENCOUNTER — Encounter: Payer: Self-pay | Admitting: Pulmonary Disease

## 2019-09-20 ENCOUNTER — Other Ambulatory Visit: Payer: Self-pay

## 2019-09-20 ENCOUNTER — Ambulatory Visit (INDEPENDENT_AMBULATORY_CARE_PROVIDER_SITE_OTHER): Payer: Medicare Other | Admitting: Pulmonary Disease

## 2019-09-20 DIAGNOSIS — J9611 Chronic respiratory failure with hypoxia: Secondary | ICD-10-CM

## 2019-09-20 DIAGNOSIS — J441 Chronic obstructive pulmonary disease with (acute) exacerbation: Secondary | ICD-10-CM

## 2019-09-20 DIAGNOSIS — I2510 Atherosclerotic heart disease of native coronary artery without angina pectoris: Secondary | ICD-10-CM | POA: Diagnosis not present

## 2019-09-20 MED ORDER — METHYLPREDNISOLONE ACETATE 80 MG/ML IJ SUSP
80.0000 mg | Freq: Once | INTRAMUSCULAR | Status: AC
  Administered 2019-09-20: 80 mg via INTRAMUSCULAR

## 2019-09-20 MED ORDER — CEFDINIR 300 MG PO CAPS
300.0000 mg | ORAL_CAPSULE | Freq: Two times a day (BID) | ORAL | 0 refills | Status: DC
Start: 2019-09-20 — End: 2019-10-21

## 2019-09-20 NOTE — Assessment & Plan Note (Signed)
Continue to little pulse on POC and 2 L continuous at home

## 2019-09-20 NOTE — Progress Notes (Signed)
° °  Subjective:    Patient ID: Kim [name], female    DOB: 24-Oct-1955, 64 y.o.   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  Chief Complaint  Patient presents with   Follow-up    pt had improved since last OV but has worsened since last Wed. Seen in ED Saturday for COPD, cough with beige sputum    Reviewed office visits from 08/2019, many recent exacerbations  COPD exacerbations: June 2020- COPD exacerbation/Prednisone + zpack September- prednisone PCP December 2020- Prednisone + zpack ? PCP January 18th 2021- COPD exacerbation/Prednisone + doxycycline  February 5th 2021- COPD exacerbation/Prednisone + Zpack  February 9th 2021- Prolonged COPD exacerbation requiring high dose prednisone/ CXR ordered  April 4th- Hospitalized for COPD exacerbation treated with IV steriods and oral prednisone taper 08/12/2019 - Zpack and extrended prednisone taper    She felt a little bit improved but had an ED visit on 6/12 -reviewed ED stay, given Solu-Medrol IV and then doxy and prednisone taper, chest x-ray did not show any infiltrates. She is worried about side effects of Daliresp, she has been taking this for 2 weeks. Uses albuterol nebs 3 times daily, complains of increased tremors. 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  Remains on 2 L pulse  Accompanied by husband who corroborates history  Significant tests/ events reviewed  11/2014: FVC 2.00 L (63%) FEV1 1.00 L (41%)ratio50 negative bronchodilator response TLC 5.06 L (103%) RV 140% DLCO uncorrected 51%  12/2018 LD CT chest - stable nodules  10/2017 CT chest-stable nodules, adrenal adenoma 10/2016 CT chest showed subcentimeter nodules which have been stable since 2017, right adrenal nodule was stable since 2015 and considered  benign  Review of Systems neg for any significant sore throat, dysphagia, itching, sneezing, nasal congestion or excess/ purulent secretions, fever, chills, sweats, unintended wt loss, pleuritic or exertional cp, hempoptysis, orthopnea pnd or change in chronic leg swelling. Also denies presyncope, palpitations, heartburn, abdominal pain, nausea, vomiting, diarrhea or change in bowel or urinary habits, dysuria,hematuria, rash, arthralgias, visual complaints, headache, numbness weakness or ataxia.     Objective:   Physical Exam   Gen. Pleasant, well-nourished, in no distress ENT - no thrush, no pallor/icterus,no post nasal drip Neck: No JVD, no thyromegaly, no carotid bruits Lungs: no use of accessory muscles, no dullness to percussion, Bl diffuse rhonchi  Cardiovascular: Rhythm regular, heart sounds  normal, no murmurs or gallops, no peripheral edema Musculoskeletal: No deformities, no cyanosis or clubbing         Assessment & Plan:

## 2019-09-20 NOTE — Patient Instructions (Signed)
Depo-Medrol 80 mg IM Stop taking doxycycline Prescription for Omnicef 300 mg twice daily for 7 days Check sputum culture  Okay to stop Daliresp for 2 weeks, then resume  Decrease prednisone as directed by ED prescription -went down to 2 tablets / 20 mg, stay at this dose until we see you again  Continue on Symbicort and Spiriva Albuterol nebs every 6 hours as needed

## 2019-09-20 NOTE — Assessment & Plan Note (Signed)
Frequent exacerbations, no clear trigger identified Chest x-ray 6/12 reviewed, no evidence of pneumonia  Depo-Medrol 80 mg IM Stop taking doxycycline Prescription for Omnicef 300 mg twice daily for 7 days Check sputum culture If no other infective cause identified, then will consider chronic suppression with azithromycin  Okay to stop Daliresp for 2 weeks, then resume -since she is worried about side effects  Decrease prednisone as directed by ED prescription -went down to 2 tablets / 20 mg, stay at this dose until we see you again , she will need extended course of prednisone over next 1 to 2 months  Continue on Symbicort and Spiriva Albuterol nebs every 6 hours as needed

## 2019-09-21 NOTE — Telephone Encounter (Signed)
Pt ended up going to ED 09/17/19 due to her symptoms. Pt had an OV with Dr. Elsworth Soho yesterday 6/15. Encounter can now be closed.

## 2019-09-21 NOTE — Telephone Encounter (Signed)
LMTCB x3 for pt. Will leave message open due to the nature of the call.

## 2019-09-22 LAB — RESPIRATORY CULTURE OR RESPIRATORY AND SPUTUM CULTURE
MICRO NUMBER:: 10592826
RESULT:: NORMAL
SPECIMEN QUALITY:: ADEQUATE

## 2019-09-30 ENCOUNTER — Other Ambulatory Visit: Payer: Self-pay

## 2019-09-30 ENCOUNTER — Ambulatory Visit (INDEPENDENT_AMBULATORY_CARE_PROVIDER_SITE_OTHER): Payer: Medicare Other | Admitting: Primary Care

## 2019-09-30 ENCOUNTER — Encounter: Payer: Self-pay | Admitting: Primary Care

## 2019-09-30 DIAGNOSIS — J449 Chronic obstructive pulmonary disease, unspecified: Secondary | ICD-10-CM

## 2019-09-30 MED ORDER — BUDESONIDE-FORMOTEROL FUMARATE 160-4.5 MCG/ACT IN AERO: 2.0000 | INHALATION_SPRAY | Freq: Two times a day (BID) | RESPIRATORY_TRACT | 6 refills | Status: DC

## 2019-09-30 MED ORDER — DALIRESP 250 MCG PO TABS: 1.0000 | ORAL_TABLET | Freq: Every day | ORAL | 1 refills | Status: DC

## 2019-09-30 MED ORDER — ALBUTEROL SULFATE HFA 108 (90 BASE) MCG/ACT IN AERS: INHALATION_SPRAY | RESPIRATORY_TRACT | 1 refills | Status: DC

## 2019-09-30 NOTE — Patient Instructions (Addendum)
Recommendations: - Resume Daliresp 250mg  daily  - If you do not tolerate Daliresp, stop. We would then consider placing you on daily azithromycin 250mg  MWF - Continue Prednisone 20mg  daily x 2 weeks; then taper to 15mg  daily until next visit  (plan would be to continue to taper by 5mg  every 2 weeks until off)  Follow-up: - 1 month with Dr. Melonie [place] or Beth/Tammy

## 2019-09-30 NOTE — Assessment & Plan Note (Signed)
-   COPD with frequent exacerbations. Significantly improved after Omnicef and since being on oral prednisone - Continue Symbicort 160 + Spiriva Respimat  - Resume Daliresp 250mg  daily. If she does not tolerate Daliresp ok to stop. We would then consider azithromycin 250mg  MWF - Continue Prednisone 20mg  daily x 2 weeks; then taper to 15mg  daily until next visit  (Plan will be to continue to taper by 5mg  every 2 weeks until off) - Follow-up:1 month with Dr. Melonie [place] or Beth/Tammy

## 2019-09-30 NOTE — Progress Notes (Signed)
@Patient  ID: Kim [name], female    DOB: Jun 26, 1955, 64 y.o.   MRN: [id number]  Chief Complaint  Patient presents with  . Follow-up    6/15 f/u, DOE better, beige sputum    Referring provider: [name], NP  HPI: 64 year old female, former smoker quit 2015 (76 pack year hx). PMH significant for COPD GOLD III, chronic respiratory failure, allergic rhinitis, coronary artery calcification, HLD, benign neoplasm ascending colon. Patient of Dr. Elsworth Soho. Maintained on Symbicort 160 and Spiriva Respimat. 2.77mcg. Eosinophils 900 in April 2021.   Previous [facility] pulmonary encounter: 05/13/2019 Patient contacted today for acute televisit. Reports wheezing and congested cough x 2 days. States that she did initially improve after completing doxycycline and prednisone taper. Her cough is thick with beige/white mucus. She has used her Duoneb twice today with minimal improvement in wheezing.  States that she does better with azithromycin antibiotic. Denies sick contact. COVID negative on 04/27/19. Declining ED eval.  05/17/2019 Patient contacted today for 4 day follow-up COPD exacerbation. Shortness of breath, wheezing and coughing symptoms have been persistent off/on for three months. She does feel 25% better since telephone visit on Friday. She completed Zpack today and has three days of prednisone taper left. Continues to have moderate amount of wheezing and productive cough with extremely thick clear/beige mucus. She is taking 1,200mg  mucinex twice and using duoneb 3-4 times a day followed by her flutter valve. She has panic attacks with coughing fits. Takes xanax as needed. LDCT in September centrilobular and paraseptal emphysema. Reports that she does better with higher dose prednisone taper from Dr. Ashok Cordia. No history of diabetes. She was Covid negative two weeks. She lives with her husband, she has not left her house. Denies fever, chills, sweats, HA, N/V/D.   06/03/2019 Patient presents today for 2  week follow-up. She is feeling much better. Completed extended prednisone taper. She still has some wheezing which is baseline for her. She did not take her inhalers this morning but reports compliance with them. Needs refill of Albuterol, uses her rescue inhaler 3-4 times a day. Chronic cough. Some rare relux related to diet. Uses her oxygen only as needed, brought it with her today but she is not wearing it. O2 93% on RA. 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   08/12/2019 Patient presents today for acute visit. She was in the hospital for three days in April for COPD exacerbation. Treated with IV steriods and discharged with prednisone taper. CXR showed no acute process.  Reports increased shortness of breath, productive cough x 3 days. Associated wheezing. Cough is productive with beige to yellow mucus. She is taking mucinex twice a day. Resumed to Symbicort and Spiriva. No perceived benefit from Trelegy 200. Not sleeping because of cough. She is taking Norco 1 tablet every 6 hours for pain and for cough suppression.   08/19/2019 Patient presents today for 1 week follow-up visit. Accompanied by her husband. She is feeling better. Still coughing up colored mucus. Completed azithromycin course. She is on prednisone taper 40mg  today. Her shortness of breath and wheezing have improved. She checked with [facility] and needs prior authorization for Daliresp and will then cost around $47. Blood pressure varies between hypertensive and hypotensive. Denies fever, chills, chest tightness or chest pain.   09/20/19 Reviewed office visits from 08/2019, many recent exacerbations She felt a little bit improved but had an ED visit on 6/12 -reviewed ED stay, given Solu-Medrol IV and then  doxy and prednisone taper, chest x-ray did not show any infiltrates. She is worried about side effects of Daliresp, she has been taking this for 2 weeks. Uses albuterol nebs 3 times daily, complains of increased  tremors. 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 Given Depo-medrol injection 80mg  IM. She was instructed to hold Daliresp for 2 weeks and then resume d/t possible side effects from medication. CXR on 6/12 reviewed which showed no evidence of pneumonia. Ordered for sputum culture. May consider azithromycin for chronic suppression if no infective cause identified. She is to stya on prednisone 20mg  until follow-up and then will need to extended course over the next 1-2 months.   States that she is doing so much better. Continues Symbicort + Spiriva. She uses 2L POC as needed, currently off it today but has it with her. She would like to try to resume Daliresp 250mg . She had previously experienced stomach pain, tremor, HA while on medication but was acutely ill at that time as well. She had an EKG in the ED.    COPD exacerbations: June 2020- COPD exacerbation/Prednisone + zpack September- prednisone PCP December 2020- Prednisone + zpack ? PCP January 18th 2021- COPD exacerbation/Prednisone + doxycycline  February 5th 2021- COPD exacerbation/Prednisone + Zpack  February 9th 2021- Prolonged COPD exacerbation requiring high dose prednisone/ CXR ordered  April 4th- Hospitalized for COPD exacerbation treated with IV steriods and oral prednisone taper 08/12/2019 - Zpack and extrended prednisone taper  09/17/19- ED, given Doxycycline which was changed to [facility]. [place] held. Started on prednisone 20mg , taper over 1-2 months.     Allergies  Allergen Reactions  . Ciprofloxacin Other (See Comments)    Hallucinations   . Lisinopril Cough  . Penicillins Hives    Has patient had a PCN reaction  causing immediate rash, facial/tongue/throat swelling, SOB or lightheadedness with hypotension: Yes Has patient had a PCN reaction causing severe rash involving mucus membranes or skin necrosis: No Has patient had a PCN reaction that required hospitalization No Has patient had a PCN reaction occurring within the last 10 years: No If all of the above answers are "NO", then may proceed with Cephalosporin use.     Immunization History  Administered Date(s) Administered  . Influenza Split 01/31/2011, 01/27/2012, 03/25/2014  . Influenza Whole 01/05/2008, 03/12/2009, 03/13/2010  . Influenza,inj,Quad PF,6+ Mos 12/29/2012, 12/20/2014, 01/07/2017, 12/17/2017, 12/17/2018  . Influenza-Unspecified 02/11/2016  . PFIZER SARS-COV-2 Vaccination 06/29/2019, 07/25/2019  . Pneumococcal Conjugate-13 08/10/2015  . Pneumococcal Polysaccharide-23 05/08/2006  . Tdap 01/31/2011    Past Medical History:  Diagnosis Date  . C8 RADICULOPATHY 06/05/2009  . COPD 08/31/2007  . DEPRESSION 11/19/2006  . DYSLIPIDEMIA 03/12/2009  . MI (mitral incompetence)   . 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 OSTEOARTHRITIS 06/05/2009  . PARESTHESIA 08/24/2007  . TOBACCO ABUSE 01/25/2010    Tobacco History: Social History   Tobacco Use  Smoking Status Former Smoker  . Packs/day: 2.00  . Years: 38.00  . Pack years: 76.00  . Types: Cigarettes  . Quit date: 03/07/2014  . Years since quitting: 5.5  Smokeless Tobacco Never Used   Counseling given: Not Answered   Outpatient Medications Prior to Visit  Medication Sig Dispense Refill  .  ALPRAZolam (XANAX) 0.5 MG tablet TAKE 1/2 TO 1 TABLET BY MOUTH THREE TIMES DAILY (Patient taking differently: Take 0.5 mg by mouth 3 (three) times daily as needed for anxiety. ) 60 tablet 2  . Ascorbic Acid (VITAMIN C PO) Take 1 tablet by mouth daily.    Marland Kitchen aspirin EC 81 MG EC tablet Take 1 tablet (81 mg total) by mouth daily.    . cefdinir (OMNICEF) 300 MG capsule Take 1 capsule (300 mg total) by mouth  2 (two) times daily. 14 capsule 0  . Cholecalciferol (VITAMIN D3) 50 MCG (2000 UT) TABS Take 2,000 Units by mouth daily.    . cyanocobalamin 2000 MCG tablet Take 2,000 mcg by mouth daily. Vitamin B12    . furosemide (LASIX) 40 MG tablet Take 1 tablet (40 mg total) by mouth daily as needed (for weight gain of 3 lbs). (Patient taking differently: Take 20-40 mg by mouth daily as needed (for overnight weight gain of 3 lbs 1/2 tablet (20 mg), for overnight weight gain of 5 lbs 1 tablet (40 mg)). ) 90 tablet 1  . Ginkgo Biloba Extract 60 MG CAPS Take 60 mg by mouth daily.    Marland Kitchen HYDROcodone-acetaminophen (NORCO) 7.5-325 MG tablet Take 1 tablet by mouth every 6 (six) hours as needed for moderate pain. (Patient taking differently: Take 1 tablet by mouth 2 (two) times daily as needed (pain/cough). ) 45 tablet 0  . HYDROcodone-acetaminophen (NORCO) 7.5-325 MG tablet Take 1 tablet by mouth every 6 (six) hours as needed for moderate pain. 30 tablet 0  . HYDROcodone-acetaminophen (NORCO) 7.5-325 MG tablet Take 1 tablet by mouth every 6 (six) hours as needed for moderate pain. 30 tablet 0  . ipratropium-albuterol (DUONEB) 0.5-2.5 (3) MG/3ML SOLN Take 3 mLs by nebulization every 6 (six) hours as needed. Dx: J44.9 (Patient taking differently: Take 3 mLs by nebulization every 6 (six) hours as needed (shortness of breath/wheezing). Dx: J44.9) 360 mL 1  . losartan (COZAAR) 25 MG tablet Take 1 tablet (25 mg total) by mouth daily. 90 tablet 1  . nitroGLYCERIN (NITROSTAT) 0.4 MG SL tablet Place 1 tablet (0.4 mg total) under the tongue every 5 (five) minutes as needed for chest pain. 25 tablet 3  . OXYGEN Inhale 2 L into the lungs continuous.    Marland Kitchen PARoxetine (PAXIL) 30 MG tablet Take 1 tablet (30 mg total) by mouth daily. 90 tablet 0  . PARoxetine (PAXIL-CR) 25 MG 24 hr tablet Take 25 mg by mouth daily.    . predniSONE (DELTASONE) 10 MG tablet Take 6 tablets (60 mg total) by mouth daily for 3 days, THEN 5 tablets (50 mg total)  daily for 3 days, THEN 4 tablets (40 mg total) daily for 3 days, THEN 3 tablets (30 mg total) daily for 3 days, THEN 2 tablets (20 mg total) daily for 3 days, THEN 1 tablet (10 mg total) daily for 3 days. 63 tablet 0  . Spacer/Aero Chamber Mouthpiece MISC 1 Device by Does not apply route as directed. 1 each 0  . Tiotropium Bromide Monohydrate (SPIRIVA RESPIMAT) 2.5 MCG/ACT AERS USE 2 INHALATIONS BY MOUTH  DAILY (Patient taking differently: Inhale 2 puffs into the lungs daily at 12 noon. ) 12 g 3  . albuterol (VENTOLIN HFA) 108 (90 Base) MCG/ACT inhaler Take two puffs every 4-6 hours as needed for shortness of breath/wheezing (Patient taking differently: Inhale 2 puffs into the lungs every 6 (six) hours as needed for wheezing or shortness of breath. ) 54  g 1  . budesonide-formoterol (SYMBICORT) 160-4.5 MCG/ACT inhaler Inhale 2 puffs into the lungs 2 (two) times daily. 30.6 g 2   No facility-administered medications prior to visit.   Review of Systems  Review of Systems  Constitutional: Negative.   Respiratory: Negative for cough, shortness of breath and wheezing.   Cardiovascular: Negative.      Physical Exam  BP 124/74 (BP Location: Left Arm, Cuff Size: Normal)   Pulse 88   Temp 98.3 F (36.8 C) (Oral)   Ht 5\' 3"  (1.6 m)   Wt 125 lb 9.6 oz (57 kg)   BMI 22.25 kg/m  Physical Exam Constitutional:      Appearance: Normal appearance.  HENT:     Head: Normocephalic and atraumatic.     Mouth/Throat:     Mouth: Mucous membranes are moist.     Pharynx: Oropharynx is clear.  Cardiovascular:     Rate and Rhythm: Normal rate and regular rhythm.  Pulmonary:     Effort: Pulmonary effort is normal. No respiratory distress.     Breath sounds: No stridor. No wheezing or rhonchi.     Comments: CTA, no overt wheezing Neurological:     General: No focal deficit present.     Mental Status: She is alert and oriented to person, place, and time. Mental status is at baseline.  Psychiatric:         Mood and Affect: Mood normal.        Behavior: Behavior normal.        Thought Content: Thought content normal.        Judgment: Judgment normal.      Lab Results:  CBC    Component Value Date/Time   WBC 8.5 09/17/2019 1621   RBC 4.24 09/17/2019 1621   HGB 13.6 09/17/2019 1632   HGB 14.0 08/26/2016 1058   HCT 40.0 09/17/2019 1632   HCT 42.6 08/26/2016 1058   PLT 367 09/17/2019 1621   PLT 297 08/26/2016 1058   MCV 97.9 09/17/2019 1621   MCV 92 08/26/2016 1058   MCH 30.7 09/17/2019 1621   MCHC 31.3 09/17/2019 1621   RDW 13.2 09/17/2019 1621   RDW 14.1 08/26/2016 1058   LYMPHSABS 1.9 09/17/2019 1621   MONOABS 0.4 09/17/2019 1621   EOSABS 0.2 09/17/2019 1621   BASOSABS 0.1 09/17/2019 1621    BMET    Component Value Date/Time   NA 142 09/17/2019 1632   NA 146 (H) 07/01/2019 1524   K 3.9 09/17/2019 1632   CL 105 09/17/2019 1621   CO2 25 09/17/2019 1621   GLUCOSE 108 (H) 09/17/2019 1621   BUN 11 09/17/2019 1621   BUN 13 07/01/2019 1524   CREATININE 0.65 09/17/2019 1621   CREATININE 1.16 (H) 07/19/2015 1447   CALCIUM 9.5 09/17/2019 1621   GFRNONAA >60 09/17/2019 1621   GFRAA >60 09/17/2019 1621    BNP    Component Value Date/Time   BNP 52.5 09/17/2019 1621    ProBNP    Component Value Date/Time   PROBNP 43 08/26/2016 1058    Imaging: DG Chest 2 View  Result Date: 09/17/2019 CLINICAL DATA:  Shortness of breath.  Productive cough. EXAM: CHEST - 2 VIEW COMPARISON:  Radiograph 07/10/2019 FINDINGS: Chronic hyperinflation. Central bronchial thickening. No acute airspace disease. Heart is normal in size with normal mediastinal contours. No pleural effusion or pneumothorax. No pulmonary edema. No acute osseous abnormalities are seen. IMPRESSION: Chronic hyperinflation and central bronchial thickening. No pneumonia or acute airspace disease.  Electronically Signed   By: Keith Rake M.D.   On: 09/17/2019 16:09     Assessment & Plan:   COPD GOLD III  - COPD  with frequent exacerbations. Significantly improved after Omnicef and since being on oral prednisone - Continue Symbicort 160 + Spiriva Respimat  - Resume Daliresp 250mg  daily. If she does not tolerate Daliresp ok to stop. We would then consider azithromycin 250mg  MWF - Continue Prednisone 20mg  daily x 2 weeks; then taper to 15mg  daily until next visit  (Plan will be to continue to taper by 5mg  every 2 weeks until off) - Follow-up:1 month with Dr. Melonie Florida or Beth/Tammy       Martyn Ehrich, NP 09/30/2019

## 2019-10-03 ENCOUNTER — Telehealth: Payer: Self-pay | Admitting: Primary Care

## 2019-10-03 MED ORDER — PREDNISONE 10 MG PO TABS: ORAL_TABLET | ORAL | 1 refills | Status: DC

## 2019-10-03 NOTE — Telephone Encounter (Signed)
Called and spoke with pt letting her know that the Dallirep, albuterol inhaler, and Symbicort were sent to [facility] on 6/25. Stated to pt that I see a pred taper from Octaviano Glow that was sent to pharmacy for her on 6/12.  Pt stated that at her OV with [facility] 6/25, Beth stated that she would send in another pred Rx to [facility] for her. Beth, please advise.

## 2019-10-03 NOTE — Telephone Encounter (Signed)
Rx for prednisone has been sent to pharmacy per [facility]. Called and spoke with pt letting her know this had been done and she verbalized understanding. Nothing further needed.

## 2019-10-03 NOTE — Telephone Encounter (Signed)
We can refill prednisone; please send in RX for Prednisone 20mg  daily x 2 weeks; then taper to 15mg  daily until next visit

## 2019-10-05 ENCOUNTER — Telehealth: Payer: Self-pay | Admitting: Primary Care

## 2019-10-05 NOTE — Telephone Encounter (Signed)
Medication name and strength: Daliresp 262mcg Provider: [place]: [place] Patient insurance ID: [id number] Phone: [phone] Fax: [phone]  Was the PA started on CMM?  yes If yes, please enter the Key: [id number] Timeframe for approval/denial: Your information has been submitted to [facility]. [employer] will review the request and will issue a decision, typically within 3-7 days from your submission. You can check the updated outcome later by reopening this request.  If [employer] has not responded in 3-7 days or if you have any questions about your ePA request, please contact [employer] at [phone].  Routing to [place] to follow up on.

## 2019-10-05 NOTE — Telephone Encounter (Signed)
Checked CMM and saw that the PA had been approved. Nothing further needed.

## 2019-10-07 ENCOUNTER — Telehealth: Payer: Self-pay | Admitting: Primary Care

## 2019-10-07 NOTE — Telephone Encounter (Signed)
Patient advised of recommendations and states she will go to the hospital for CXR.  Nothing further is needed at this time.

## 2019-10-07 NOTE — Telephone Encounter (Signed)
Spoke with patient, she fell yesterday and is now having stinging/buring when she breathes in.  She denies any sob and her sats are 91-92% on RA.  She is requesting a CXR.  Sarah, please advise.  Thank you.

## 2019-10-07 NOTE — Telephone Encounter (Signed)
I would prefer she go to th hospital for the CXR If she refuses,  Place order for CXR with Stat read. We will call her with results. Let her know if there is an acute finding we will refer her to the hospital for treatment. Thanks

## 2019-10-21 ENCOUNTER — Encounter: Payer: Self-pay | Admitting: Adult Health

## 2019-10-21 ENCOUNTER — Other Ambulatory Visit: Payer: Self-pay | Admitting: Adult Health

## 2019-10-21 ENCOUNTER — Ambulatory Visit (INDEPENDENT_AMBULATORY_CARE_PROVIDER_SITE_OTHER): Payer: Medicare Other | Admitting: Adult Health

## 2019-10-21 ENCOUNTER — Other Ambulatory Visit: Payer: Self-pay

## 2019-10-21 VITALS — BP 150/80 | Temp 98.3°F | Wt 129.0 lb

## 2019-10-21 DIAGNOSIS — G8929 Other chronic pain: Secondary | ICD-10-CM

## 2019-10-21 DIAGNOSIS — M8949 Other hypertrophic osteoarthropathy, multiple sites: Secondary | ICD-10-CM

## 2019-10-21 DIAGNOSIS — I2510 Atherosclerotic heart disease of native coronary artery without angina pectoris: Secondary | ICD-10-CM

## 2019-10-21 DIAGNOSIS — M159 Polyosteoarthritis, unspecified: Secondary | ICD-10-CM

## 2019-10-21 NOTE — Progress Notes (Signed)
Subjective:    Patient ID: Kim [name], female    DOB: 1956-01-21, 64 y.o.   MRN: [id number]  HPI [facility] reviewed in EPIC   Indication for chronic opioid:  Osteoarthritis involving multiple joints Medication and dose:  Norco 7.5-3.25 mg, every 6 hours as needed # pills per month: 45 Last UDS date: 03/01/2019 Opioid Treatment Agreement signed: yes Opioid Treatment Agreement last reviewed with patient: 03/01/2019 [facility] reviewed this encounter (include red flags): Reviewed and no red flags noticed   She feels well managed on this dose of Norco   Review of Systems See HPI   Past Medical History:  Diagnosis Date  . C8 RADICULOPATHY 06/05/2009  . COPD 08/31/2007  . DEPRESSION 11/19/2006  . DYSLIPIDEMIA 03/12/2009  . MI (mitral incompetence)   . 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 OSTEOARTHRITIS 06/05/2009  . PARESTHESIA 08/24/2007  . TOBACCO ABUSE 01/25/2010    Social History   Socioeconomic History  . Marital status: Married    Spouse name: [name]  . Number of children: 2  . Years of education: Not on file  . Highest education level: Not on file  Occupational History  . Occupation: Patient care aide-sits with elderly lady  Tobacco Use  . Smoking status: Former Smoker    Packs/day: 2.00    Years: 38.00    Pack years: 76.00    Types: Cigarettes    Quit date: 03/07/2014    Years since quitting: 5.6  . Smokeless tobacco: Never Used  Vaping Use  . Vaping Use: Never used  Substance and Sexual Activity  . Alcohol use: No    Alcohol/week: 0.0 standard drinks  . Drug use: No  . Sexual activity: Not on file  Other Topics Concern  . Not on file  Social History Narrative   [facility] (01/12/17):   Originally from [facility]. Has lived in [place] for 17 years. Married. Has 2 dogs currently. No mold and only 1 indoor plant. Had her home professionally cleaned a month ago. No bird or hot tub exposure. 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    Social  Determinants of Health   Financial Resource Strain:   . Difficulty of Paying Living Expenses:   Food Insecurity:   . Worried About Charity fundraiser in the Last Year:   . Arboriculturist in the Last Year:   Transportation Needs:   . Film/video editor (Medical):   Marland Kitchen Lack of Transportation (Non-Medical):   Physical Activity:   . Days of Exercise per Week:   . Minutes of Exercise per Session:   Stress:   . Feeling of Stress :   Social Connections:   . Frequency of Communication with Friends and Family:   . Frequency of Social Gatherings with Friends and Family:   . Attends Religious Services:   . Active Member of Clubs or Organizations:   . Attends Archivist Meetings:   Marland Kitchen Marital Status:   Intimate Partner Violence:   . Fear of Current or Ex-Partner:   . Emotionally Abused:   Marland Kitchen Physically Abused:   . Sexually Abused:     Past Surgical History:  Procedure Laterality Date  . ABDOMINAL HYSTERECTOMY    . APPENDECTOMY  1969  . CARDIAC CATHETERIZATION N/A 07/05/2015   Procedure: Left Heart Cath and Coronary Angiography;  Surgeon: Leonie Man, MD;  Location: [facility];  Service: Cardiovascular;  Laterality: N/A;  . CHOLECYSTECTOMY  1989  .  collapse lung  1990  . COLONOSCOPY WITH PROPOFOL N/A 05/14/2018   Procedure: COLONOSCOPY WITH PROPOFOL;  Surgeon: Irene Shipper, MD;  Location: [facility];  Service: Endoscopy;  Laterality: N/A;  . EXCISION MASS ABDOMINAL  2019  . EXPLORATORY LAPAROTOMY  1969  . POLYPECTOMY  05/14/2018   Procedure: POLYPECTOMY;  Surgeon: Irene Shipper, MD;  Location: [facility];  Service: Endoscopy;;  . SALPINGOOPHORECTOMY  2019    Family History  Problem Relation Age of Onset  . Cancer Mother        lung  . Heart attack Mother   . Hypertension Sister   . Multiple sclerosis Brother   . Diabetes Sister   . Rheum arthritis Sister   . Thyroid disease Sister   . Multiple sclerosis Other   . Alcohol abuse Other   . Arthritis  Other   . Diabetes Other   . Kidney disease Other   . Cancer Other        lung,ovarian,skin, uterine  . Stroke Other   . Heart disease Other   . Melanoma Other   . Heart attack Maternal Uncle   . Heart attack Other        NEICE  . Heart attack Other        NEPHEW  . Hypertension Brother   . Stroke Maternal Grandmother   . Colon cancer Neg Hx     Allergies  Allergen Reactions  . Ciprofloxacin Other (See Comments)    Hallucinations   . Lisinopril Cough  . Penicillins Hives    Has patient had a PCN reaction causing immediate rash, facial/tongue/throat swelling, SOB or lightheadedness with hypotension: Yes Has patient had a PCN reaction causing severe rash involving mucus membranes or skin necrosis: No Has patient had a PCN reaction that required hospitalization No Has patient had a PCN reaction occurring within the last 10 years: No If all of the above answers are "NO", then may proceed with Cephalosporin use.     Current Outpatient Medications on File Prior to Visit  Medication Sig Dispense Refill  . albuterol (VENTOLIN HFA) 108 (90 Base) MCG/ACT inhaler Take two puffs every 4-6 hours as needed for shortness of breath/wheezing 54 g 1  . ALPRAZolam (XANAX) 0.5 MG tablet TAKE 1/2 TO 1 TABLET BY MOUTH THREE TIMES DAILY (Patient taking differently: Take 0.5 mg by mouth 3 (three) times daily as needed for anxiety. ) 60 tablet 2  . Ascorbic Acid (VITAMIN C PO) Take 1 tablet by mouth daily.    Marland Kitchen aspirin EC 81 MG EC tablet Take 1 tablet (81 mg total) by mouth daily.    . budesonide-formoterol (SYMBICORT) 160-4.5 MCG/ACT inhaler Inhale 2 puffs into the lungs 2 (two) times daily. 30.6 g 6  . Cholecalciferol (VITAMIN D3) 50 MCG (2000 UT) TABS Take 2,000 Units by mouth daily.    . cyanocobalamin 2000 MCG tablet Take 2,000 mcg by mouth daily. Vitamin B12    . furosemide (LASIX) 40 MG tablet Take 1 tablet (40 mg total) by mouth daily as needed (for weight gain of 3 lbs). (Patient taking  differently: Take 20-40 mg by mouth daily as needed (for overnight weight gain of 3 lbs 1/2 tablet (20 mg), for overnight weight gain of 5 lbs 1 tablet (40 mg)). ) 90 tablet 1  . Ginkgo Biloba Extract 60 MG CAPS Take 60 mg by mouth daily.    Marland Kitchen HYDROcodone-acetaminophen (NORCO) 7.5-325 MG tablet Take 1 tablet by mouth every 6 (six) hours as  needed for moderate pain. (Patient taking differently: Take 1 tablet by mouth 2 (two) times daily as needed (pain/cough). ) 45 tablet 0  . HYDROcodone-acetaminophen (NORCO) 7.5-325 MG tablet Take 1 tablet by mouth every 6 (six) hours as needed for moderate pain. 30 tablet 0  . HYDROcodone-acetaminophen (NORCO) 7.5-325 MG tablet Take 1 tablet by mouth every 6 (six) hours as needed for moderate pain. 30 tablet 0  . ipratropium-albuterol (DUONEB) 0.5-2.5 (3) MG/3ML SOLN Take 3 mLs by nebulization every 6 (six) hours as needed. Dx: J44.9 (Patient taking differently: Take 3 mLs by nebulization every 6 (six) hours as needed (shortness of breath/wheezing). Dx: J44.9) 360 mL 1  . losartan (COZAAR) 25 MG tablet Take 1 tablet (25 mg total) by mouth daily. 90 tablet 1  . nitroGLYCERIN (NITROSTAT) 0.4 MG SL tablet Place 1 tablet (0.4 mg total) under the tongue every 5 (five) minutes as needed for chest pain. 25 tablet 3  . OXYGEN Inhale 2 L into the lungs continuous.    Marland Kitchen PARoxetine (PAXIL) 30 MG tablet Take 1 tablet (30 mg total) by mouth daily. 90 tablet 0  . PARoxetine (PAXIL-CR) 25 MG 24 hr tablet Take 25 mg by mouth daily.    . predniSONE (DELTASONE) 10 MG tablet Take 2tablets daily x2 weeks then take 1.5tablets daily until next visit 30 tablet 1  . Roflumilast (DALIRESP) 250 MCG TABS Take 1 tablet by mouth daily. 30 tablet 1  . Spacer/Aero Chamber Mouthpiece MISC 1 Device by Does not apply route as directed. 1 each 0  . Tiotropium Bromide Monohydrate (SPIRIVA RESPIMAT) 2.5 MCG/ACT AERS USE 2 INHALATIONS BY MOUTH  DAILY (Patient taking differently: Inhale 2 puffs into the  lungs daily at 12 noon. ) 12 g 3   No current facility-administered medications on file prior to visit.    BP (!) 150/80   Temp 98.3 F (36.8 C)   Wt 129 lb (58.5 kg)   BMI 22.85 kg/m       Objective:   Physical Exam Vitals and nursing note reviewed.  Constitutional:      Appearance: Normal appearance.  Cardiovascular:     Rate and Rhythm: Normal rate and regular rhythm.     Pulses: Normal pulses.     Heart sounds: Normal heart sounds.  Pulmonary:     Effort: Pulmonary effort is normal.     Breath sounds: Normal breath sounds.  Musculoskeletal:        General: Normal range of motion.  Skin:    General: Skin is warm and dry.  Neurological:     General: No focal deficit present.     Mental Status: She is alert and oriented to person, place, and time.  Psychiatric:        Mood and Affect: Mood normal.        Behavior: Behavior normal.        Thought Content: Thought content normal.        Judgment: Judgment normal.       Assessment & Plan:  1. Encounter for chronic pain management  - DRUG MONITORING, PANEL 8 WITH CONFIRMATION, URINE; Future  2. Primary osteoarthritis involving multiple joints  - DRUG MONITORING, PANEL 8 WITH CONFIRMATION, URINE; Future   [name], NP

## 2019-10-26 LAB — DRUG MONITORING, PANEL 8 WITH CONFIRMATION, URINE
6 Acetylmorphine: NEGATIVE ng/mL (ref <10)
Alcohol Metabolites: NEGATIVE ng/mL
Alphahydroxyalprazolam: 273 ng/mL — ABNORMAL HIGH (ref <25)
Alphahydroxymidazolam: NEGATIVE ng/mL (ref <50)
Alphahydroxytriazolam: NEGATIVE ng/mL (ref <50)
Aminoclonazepam: NEGATIVE ng/mL (ref <25)
Amphetamines: NEGATIVE ng/mL (ref <500)
Benzodiazepines: POSITIVE ng/mL — AB (ref <100)
Buprenorphine, Urine: NEGATIVE ng/mL (ref <5)
Cocaine Metabolite: NEGATIVE ng/mL (ref <150)
Codeine: NEGATIVE ng/mL (ref <50)
Creatinine: 210.5 mg/dL
Hydrocodone: 752 ng/mL — ABNORMAL HIGH (ref <50)
Hydromorphone: 116 ng/mL — ABNORMAL HIGH (ref <50)
Hydroxyethylflurazepam: NEGATIVE ng/mL (ref <50)
Lorazepam: NEGATIVE ng/mL (ref <50)
MDMA: NEGATIVE ng/mL (ref <500)
Marijuana Metabolite: NEGATIVE ng/mL (ref <20)
Morphine: NEGATIVE ng/mL (ref <50)
Nordiazepam: NEGATIVE ng/mL (ref <50)
Norhydrocodone: 2112 ng/mL — ABNORMAL HIGH (ref <50)
Opiates: POSITIVE ng/mL — AB (ref <100)
Oxazepam: NEGATIVE ng/mL (ref <50)
Oxidant: NEGATIVE ug/mL
Oxycodone: NEGATIVE ng/mL (ref <100)
Temazepam: NEGATIVE ng/mL (ref <50)
pH: 6.2 (ref 4.5–9.0)

## 2019-10-26 LAB — DM TEMPLATE

## 2019-10-27 ENCOUNTER — Other Ambulatory Visit: Payer: Self-pay | Admitting: Adult Health

## 2019-10-27 DIAGNOSIS — M159 Polyosteoarthritis, unspecified: Secondary | ICD-10-CM

## 2019-10-27 MED ORDER — HYDROCODONE-ACETAMINOPHEN 7.5-325 MG PO TABS: 1.0000 | ORAL_TABLET | Freq: Four times a day (QID) | ORAL | 0 refills | Status: DC | PRN

## 2019-10-27 MED ORDER — HYDROCODONE-ACETAMINOPHEN 7.5-325 MG PO TABS: 1.0000 | ORAL_TABLET | Freq: Two times a day (BID) | ORAL | 0 refills | Status: DC | PRN

## 2019-10-28 ENCOUNTER — Other Ambulatory Visit: Payer: Medicare Other

## 2019-11-03 NOTE — Patient Instructions (Addendum)
Pleasure seeing you today Ms Pillard  COPD: - Continue Symbicort 160 two puffs twice daily - Continue Spiriva Respimat 2.45mcg two puffs once daily  - Stay on Daliresp 277mcg and stay on this dose - Taper prednisone 10mg  x 2 weeks; then 5mg  x 2 week; then stop  - Take prednisone taper if needed for acute exacerbation while on vacation  (40mg  x 2 days; 30mg  x 2 days; 20mg  x 2 days; 10mg  x 2 days)   Follow-up: - 3 months with Dr. Elsworth Soho or Eustaquio Maize NP    Roflumilast oral tablets What is this medicine? ROFLUMILAST (roe FLUE mi last) decreases inflammation in the lungs. This medicine is used to prevent COPD flare-ups. Do not use this medicine to treat sudden breathing problems. This medicine may be used for other purposes; ask your health care provider or pharmacist if you have questions. COMMON BRAND NAME(S): Daliresp What should I tell my health care provider before I take this medicine? They need to know if you have any of these conditions:  liver disease  mental illness  an unusual or allergic reaction to roflumilast, other medicines, foods, dyes, or preservatives  pregnant or trying to get pregnant  breast-feeding How should I use this medicine? Take this medicine by mouth with a glass of water. Follow the directions on the prescription label. You can take it with or without food. If it upsets your stomach, take it with food. Take your medicine at regular intervals. Do not take it more often than directed. Do not stop taking except on your doctor's advice. A special MedGuide will be given to you by the pharmacist with each prescription and refill. Be sure to read this information carefully each time. Talk to your pediatrician regarding the use of this medicine in children. Special care may be needed. Overdosage: If you think you have taken too much of this medicine contact a poison control center or emergency room at once. NOTE: This medicine is only for you. Do not share this medicine  with others. What if I miss a dose? If you miss a dose, take it as soon as you can. If it is almost time for your next dose, take only that dose. Do not take double or extra doses. What may interact with this medicine?  carbamazepine  cimetidine  enoxacin  erythromycin  fluvoxamine  ketoconazole  phenobarbital  phenytoin  rifampicin  St. John's wort This list may not describe all possible interactions. Give your health care provider a list of all the medicines, herbs, non-prescription drugs, or dietary supplements you use. Also tell them if you smoke, drink alcohol, or use illegal drugs. Some items may interact with your medicine. What should I watch for while using this medicine? Visit your doctor for regular check ups. Tell your doctor or healthcare professional if your symptoms do not start to get better or if they get worse. What side effects may I notice from receiving this medicine? Side effects that you should report to your doctor or health care professional as soon as possible:  allergic reactions like skin rash, itching or hives, swelling of the face, lips, or tongue  anxious  breathing problems  suicidal thoughts or other mood changes  trouble sleeping  weight loss Side effects that usually do not require medical attention (report to your doctor or health care professional if they continue or are bothersome):  back pain  diarrhea  dizziness  general ill feeling or flu-like symptoms  headache  loss of appetite  nausea, vomiting This list may not describe all possible side effects. Call your doctor for medical advice about side effects. You may report side effects to FDA at 1-800-FDA-1088. Where should I keep my medicine? Keep out of the reach of children. Store at room temperature between 15 and 30 degrees C (59 and 86 degrees F). Throw away any unused medicine after the expiration date. NOTE: This sheet is a summary. It may not cover all possible  information. If you have questions about this medicine, talk to your doctor, pharmacist, or health care provider.  2020 Elsevier/Gold Standard (2015-04-26 09:35:23)

## 2019-11-03 NOTE — Progress Notes (Signed)
@Patient  ID: Kim [name], female    DOB: [birth year], 64 y.o.   MRN: [id number]  Chief Complaint  Patient presents with  . Follow-up    COPD    Referring provider: [name], NP  HPI: 64 year old female, former smoker quit 2015 (76 pack year hx). PMH significant for COPD GOLD III, chronic respiratory failure, allergic rhinitis, coronary artery calcification, HLD, benign neoplasm ascending colon. Patient of Dr. Elsworth Soho. Maintained on Symbicort 160 and Spiriva Respimat 2.89mcg. Eosinophils 900 in April 2021.   Previous [facility] pulmonary encounter: 05/13/2019 Patient contacted today for acute televisit. Reports wheezing and congested cough x 2 days. States that she did initially improve after completing doxycycline and prednisone taper. Her cough is thick with beige/white mucus. She has used her Duoneb twice today with minimal improvement in wheezing.  States that she does better with azithromycin antibiotic. Denies sick contact. COVID negative on 04/27/19. Declining ED eval.  05/17/2019 Patient contacted today for 4 day follow-up COPD exacerbation. Shortness of breath, wheezing and coughing symptoms have been persistent off/on for three months. She does feel 25% better since telephone visit on Friday. She completed Zpack today and has three days of prednisone taper left. Continues to have moderate amount of wheezing and productive cough with extremely thick clear/beige mucus. She is taking 1,200mg  mucinex twice and using duoneb 3-4 times a day followed by her flutter valve. She has panic attacks with coughing fits. Takes xanax as needed. LDCT in September centrilobular and paraseptal emphysema. Reports that she does better with higher dose prednisone taper from Dr. Ashok Cordia. No history of diabetes. She was Covid negative two weeks. She lives with her husband, she has not left her house. Denies fever, chills, sweats, HA, N/V/D.   06/03/2019 Patient presents today for 2 week follow-up. She is feeling  much better. Completed extended prednisone taper. She still has some wheezing which is baseline for her. She did not take her inhalers this morning but reports compliance with them. Needs refill of Albuterol, uses her rescue inhaler 3-4 times a day. Chronic cough. Some rare relux related to diet. Uses her oxygen only as needed, brought it with her today but she is not wearing it. O2 93% on RA. 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   08/12/2019 Patient presents today for acute visit. She was in the hospital for three days in April for COPD exacerbation. Treated with IV steriods and discharged with prednisone taper. CXR showed no acute process.  Reports increased shortness of breath, productive cough x 3 days. Associated wheezing. Cough is productive with beige to yellow mucus. She is taking mucinex twice a day. Resumed to Symbicort and Spiriva. No perceived benefit from Trelegy 200. Not sleeping because of cough. She is taking Norco 1 tablet every 6 hours for pain and for cough suppression.   08/19/2019 Patient presents today for 1 week follow-up visit. Accompanied by her husband. She is feeling better. Still coughing up colored mucus. Completed azithromycin course. She is on prednisone taper 40mg  today. Her shortness of breath and wheezing have improved. She checked with [facility] and needs prior authorization for Daliresp and will then cost around $47. Blood pressure varies between hypertensive and hypotensive. Denies fever, chills, chest tightness or chest pain.   09/20/19 Reviewed office visits from 08/2019, many recent exacerbations She felt a little bit improved but had an ED visit on 6/12 -reviewed ED stay, given Solu-Medrol IV and then doxy and prednisone taper, chest  x-ray did not show any infiltrates. She is worried about side effects of Daliresp, she has been taking this for 2 weeks. Uses albuterol nebs 3 times daily, complains of increased tremors. 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 Given Depo-medrol injection 80mg  IM. She was instructed to hold Daliresp for 2 weeks and then resume d/t possible side effects from medication. CXR on 6/12 reviewed which showed no evidence of pneumonia. Ordered for sputum culture. May consider azithromycin for chronic suppression if no infective cause identified. She is to stya on prednisone 20mg  until follow-up and then will need to extended course over the next 1-2 months.   States that she is doing so much better. Continues Symbicort + Spiriva. She uses 2L [place] as needed, currently off it today but has it with her. She would like to try to resume Daliresp 250mg . She had previously experienced stomach pain, tremor, HA while on medication but was acutely ill at that time as well. She had an EKG in the ED.   11/04/2019- interim hx Patient presents today for 1 month follow-up visit. Accompanied by her husband. She is doing fairly well. No exacerbations since last visit in June. Started on Daliresp 2108mcg in June to help decreased frequency of COPD exacerbation. She is having some mild side effects including tremor that last for 15 mins or less with medication but wants to continue taking. She is on prednisone 15mg , plan to taper off. She takes generic zyrtec. She has an upcoming vacation with her girlfriends to [place] for 5 days and feels well enough to go. She has a chronic productive cough with beige mucus. No fevers, chills, sweats, chest tightness, wheezing or purulent mucus.    COPD exacerbations: June 2020- COPD exacerbation/Prednisone + zpack September- prednisone PCP December 2020- Prednisone + zpack ? PCP January 18th 2021-  COPD exacerbation/Prednisone + doxycycline  February 5th 2021- COPD exacerbation/Prednisone + Zpack  February 9th 2021- Prolonged COPD exacerbation requiring high dose prednisone/ CXR ordered  April 4th- Hospitalized for COPD exacerbation treated with IV steriods and oral prednisone taper 08/12/2019 - Zpack and extrended prednisone taper  09/17/19- ED, given Doxycycline which was changed to [facility]. [place] held. Started on prednisone 20mg , taper over 1-2 months.  September 06, 2019- Started on Daliresp 285mcg, mild effects wants to continue   Allergies  Allergen Reactions  . Ciprofloxacin Other (See Comments)    Hallucinations   . Lisinopril Cough  . Penicillins Hives    Has patient had a PCN reaction causing immediate rash, facial/tongue/throat swelling, SOB or lightheadedness with hypotension: Yes Has patient had a PCN reaction causing severe rash involving mucus membranes or skin necrosis: No Has patient had a PCN reaction that required hospitalization No Has patient had a PCN reaction occurring within the last 10 years: No If all of the above answers are "NO", then may proceed with Cephalosporin use.     Immunization History  Administered Date(s) Administered  . Influenza Split 01/31/2011, 01/27/2012, 03/25/2014  . Influenza Whole 01/05/2008, 03/12/2009, 03/13/2010  . Influenza,inj,Quad PF,6+ Mos 12/29/2012, 12/20/2014, 01/07/2017, 12/17/2017, 12/17/2018  . Influenza-Unspecified 01/31/2011, 01/27/2012, 12/29/2012, 03/25/2014, 12/20/2014, 02/11/2016, 01/07/2017, 12/17/2017, 12/17/2018  . PFIZER SARS-COV-2 Vaccination 06/29/2019, 07/25/2019  .  Pneumococcal Conjugate-13 08/10/2015  . Pneumococcal Polysaccharide-23 05/08/2006  . Tdap 01/31/2011    Past Medical History:  Diagnosis Date  . C8 RADICULOPATHY 06/05/2009  . COPD 08/31/2007  . DEPRESSION 11/19/2006  . DYSLIPIDEMIA 03/12/2009  . MI (mitral incompetence)   . 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 OSTEOARTHRITIS 06/05/2009  . PARESTHESIA  08/24/2007  . TOBACCO ABUSE 01/25/2010    Tobacco History: Social History   Tobacco Use  Smoking Status Former Smoker  . Packs/day: 2.00  . Years: 38.00  . Pack years: 76.00  . Types: Cigarettes  . Quit date: 03/07/2014  . Years since quitting: 5.6  Smokeless Tobacco Never Used   Counseling given: Not Answered   Outpatient Medications Prior to Visit  Medication Sig Dispense Refill  . albuterol (VENTOLIN HFA) 108 (90 Base) MCG/ACT inhaler Take two puffs every 4-6 hours as needed for shortness of breath/wheezing 54 g 1  . ALPRAZolam (XANAX) 0.5 MG tablet TAKE 1/2 TO 1 TABLET BY MOUTH THREE TIMES DAILY (Patient taking differently: Take 0.5 mg by mouth 3 (three) times daily as needed for anxiety. ) 60 tablet 2  . Ascorbic Acid (VITAMIN C PO) Take 1 tablet by mouth daily.    Marland Kitchen aspirin EC 81 MG EC tablet Take 1 tablet (81 mg total) by mouth daily.    . budesonide-formoterol (SYMBICORT) 160-4.5 MCG/ACT inhaler Inhale 2 puffs into the lungs 2 (two) times daily. 30.6 g 6  . Cholecalciferol (VITAMIN D3) 50 MCG (2000 UT) TABS Take 2,000 Units by mouth daily.    . cyanocobalamin 2000 MCG tablet Take 2,000 mcg by mouth daily. Vitamin B12    . furosemide (LASIX) 40 MG tablet Take 1 tablet (40 mg total) by mouth daily as needed (for weight gain of 3 lbs). (Patient taking differently: Take 20-40 mg by mouth daily as needed (for overnight weight gain of 3 lbs 1/2 tablet (20 mg), for overnight weight gain of 5 lbs 1 tablet (40 mg)). ) 90 tablet 1  . Ginkgo Biloba Extract 60 MG CAPS Take 60 mg by mouth daily.    Marland Kitchen HYDROcodone-acetaminophen (NORCO) 7.5-325 MG tablet Take 1 tablet by mouth 2 (two) times daily as needed (pain/cough). 45 tablet 0  . HYDROcodone-acetaminophen (NORCO) 7.5-325 MG tablet Take 1 tablet by mouth every 6 (six) hours as needed for moderate pain. 45 tablet 0  . HYDROcodone-acetaminophen (NORCO) 7.5-325 MG tablet Take 1 tablet by mouth every 6 (six) hours as needed for moderate pain.  45 tablet 0  . ipratropium-albuterol (DUONEB) 0.5-2.5 (3) MG/3ML SOLN Take 3 mLs by nebulization every 6 (six) hours as needed. Dx: J44.9 (Patient taking differently: Take 3 mLs by nebulization every 6 (six) hours as needed (shortness of breath/wheezing). Dx: J44.9) 360 mL 1  . losartan (COZAAR) 25 MG tablet Take 1 tablet (25 mg total) by mouth daily. 90 tablet 1  . nitroGLYCERIN (NITROSTAT) 0.4 MG SL tablet Place 1 tablet (0.4 mg total) under the tongue every 5 (five) minutes as needed for chest pain. 25 tablet 3  . OXYGEN Inhale 2 L into the lungs continuous.    Marland Kitchen PARoxetine (PAXIL) 30 MG tablet Take 1 tablet (30 mg total) by mouth daily. 90 tablet 0  . PARoxetine (PAXIL-CR) 25 MG 24 hr tablet Take 25 mg by mouth daily.    . Roflumilast (DALIRESP) 250 MCG TABS Take 1 tablet by mouth daily. (Patient taking differently: Take 1 tablet by mouth every other day. ) 30 tablet 1  . Spacer/Aero Chamber [employer]  1 Device by Does not apply route as directed. 1 each 0  . Tiotropium Bromide Monohydrate (SPIRIVA RESPIMAT) 2.5 MCG/ACT AERS USE 2 INHALATIONS BY MOUTH  DAILY (Patient taking differently: Inhale 2 puffs into the lungs daily at 12 noon. ) 12 g 3  . predniSONE (DELTASONE) 10 MG tablet Take 2tablets daily x2 weeks then take 1.5tablets daily until next visit 30 tablet 1   No facility-administered medications prior to visit.    Review of Systems  Review of Systems  Constitutional: Negative.   Respiratory: Positive for cough. Negative for chest tightness, shortness of breath and wheezing.   Neurological: Positive for tremors.    Physical Exam  BP (!) 130/70 (BP Location: Left Arm, Cuff Size: Normal)   Pulse 90   Temp (!) 97.3 F (36.3 C) (Oral)   Ht 5\' 3"  (1.6 m)   Wt 127 lb 12.8 oz (58 kg)   SpO2 95%   BMI 22.64 kg/m  Physical Exam Constitutional:      General: She is not in acute distress.    Appearance: Normal appearance. She is not ill-appearing.  HENT:     Mouth/Throat:      Mouth: Mucous membranes are moist.     Pharynx: Oropharynx is clear.  Cardiovascular:     Rate and Rhythm: Normal rate and regular rhythm.     Comments: No edema Pulmonary:     Effort: Pulmonary effort is normal.     Breath sounds: No wheezing or rhonchi.     Comments: CTA Musculoskeletal:        General: Normal range of motion.     Cervical back: Normal range of motion and neck supple.  Skin:    General: Skin is warm and dry.  Neurological:     General: No focal deficit present.     Mental Status: She is alert and oriented to person, place, and time. Mental status is at baseline.  Psychiatric:        Mood and Affect: Mood normal.        Behavior: Behavior normal.        Thought Content: Thought content normal.        Judgment: Judgment normal.      Lab Results:   CBC    Component Value Date/Time   WBC 8.5 09/17/2019 1621   RBC 4.24 09/17/2019 1621   HGB 13.6 09/17/2019 1632   HGB 14.0 08/26/2016 1058   HCT 40.0 09/17/2019 1632   HCT 42.6 08/26/2016 1058   PLT 367 09/17/2019 1621   PLT 297 08/26/2016 1058   MCV 97.9 09/17/2019 1621   MCV 92 08/26/2016 1058   MCH 30.7 09/17/2019 1621   MCHC 31.3 09/17/2019 1621   RDW 13.2 09/17/2019 1621   RDW 14.1 08/26/2016 1058   LYMPHSABS 1.9 09/17/2019 1621   MONOABS 0.4 09/17/2019 1621   EOSABS 0.2 09/17/2019 1621   BASOSABS 0.1 09/17/2019 1621    BMET    Component Value Date/Time   NA 142 09/17/2019 1632   NA 146 (H) 07/01/2019 1524   K 3.9 09/17/2019 1632   CL 105 09/17/2019 1621   CO2 25 09/17/2019 1621   GLUCOSE 108 (H) 09/17/2019 1621   BUN 11 09/17/2019 1621   BUN 13 07/01/2019 1524   CREATININE 0.65 09/17/2019 1621   CREATININE 1.16 (H) 07/19/2015 1447   CALCIUM 9.5 09/17/2019 1621   GFRNONAA >60 09/17/2019 1621   GFRAA >60 09/17/2019 1621    BNP    Component  Value Date/Time   BNP 52.5 09/17/2019 1621    ProBNP    Component Value Date/Time   PROBNP 43 08/26/2016 1058    Imaging: No  results found.   Assessment & Plan:   COPD GOLD III  - Doing quite well, no exacerbations in the last month. She is having some mild side effects with DALIRESP but wants to continue medication as if has been helpful for her breathing - Continue Symbicort 160 two puffs twice daily; Spiriva Respimat 2.22mcg two puffs once daily  - Stay on Daliresp 278mcg daily - Taper prednisone to 10mg  x 2 weeks; then 5mg  x 2 week; then stop  - OK to take prednisone taper if needed for acute exacerbation while on vacation  - FU in 3 months with Dr. Elsworth Soho or Maryclare Bean, NP 11/04/2019

## 2019-11-04 ENCOUNTER — Encounter: Payer: Self-pay | Admitting: Primary Care

## 2019-11-04 ENCOUNTER — Other Ambulatory Visit: Payer: Self-pay

## 2019-11-04 ENCOUNTER — Ambulatory Visit (INDEPENDENT_AMBULATORY_CARE_PROVIDER_SITE_OTHER): Payer: Medicare Other | Admitting: Primary Care

## 2019-11-04 DIAGNOSIS — J449 Chronic obstructive pulmonary disease, unspecified: Secondary | ICD-10-CM | POA: Diagnosis not present

## 2019-11-04 MED ORDER — PREDNISONE 10 MG PO TABS: ORAL_TABLET | ORAL | 0 refills | Status: DC

## 2019-11-04 NOTE — Assessment & Plan Note (Signed)
-   Doing quite well, no exacerbations in the last month. She is having some mild side effects with DALIRESP but wants to continue medication as if has been helpful for her breathing - Continue Symbicort 160 two puffs twice daily; Spiriva Respimat 2.19mcg two puffs once daily  - Stay on Daliresp 2100mcg daily - Taper prednisone to 10mg  x 2 weeks; then 5mg  x 2 week; then stop  - OK to take prednisone taper if needed for acute exacerbation while on vacation  - FU in 3 months with Dr. Elsworth Soho or Eustaquio Maize

## 2019-11-24 ENCOUNTER — Other Ambulatory Visit: Payer: Self-pay | Admitting: Adult Health

## 2019-11-24 DIAGNOSIS — F329 Major depressive disorder, single episode, unspecified: Secondary | ICD-10-CM

## 2019-11-24 DIAGNOSIS — F32A Depression, unspecified: Secondary | ICD-10-CM

## 2019-11-25 ENCOUNTER — Other Ambulatory Visit: Payer: Self-pay | Admitting: Pulmonary Disease

## 2019-11-25 DIAGNOSIS — J9611 Chronic respiratory failure with hypoxia: Secondary | ICD-10-CM

## 2019-11-25 DIAGNOSIS — J449 Chronic obstructive pulmonary disease, unspecified: Secondary | ICD-10-CM

## 2019-11-28 ENCOUNTER — Other Ambulatory Visit: Payer: Self-pay | Admitting: Pulmonary Disease

## 2019-11-28 DIAGNOSIS — J9611 Chronic respiratory failure with hypoxia: Secondary | ICD-10-CM

## 2019-11-28 DIAGNOSIS — J449 Chronic obstructive pulmonary disease, unspecified: Secondary | ICD-10-CM

## 2019-11-28 MED ORDER — IPRATROPIUM-ALBUTEROL 0.5-2.5 (3) MG/3ML IN SOLN: 3.0000 mL | Freq: Four times a day (QID) | RESPIRATORY_TRACT | 1 refills | Status: DC | PRN

## 2019-12-14 ENCOUNTER — Telehealth: Payer: Self-pay | Admitting: Primary Care

## 2019-12-14 MED ORDER — PREDNISONE 10 MG PO TABS: ORAL_TABLET | ORAL | 0 refills | Status: DC

## 2019-12-14 MED ORDER — DOXYCYCLINE HYCLATE 100 MG PO TABS: 100.0000 mg | ORAL_TABLET | Freq: Two times a day (BID) | ORAL | 0 refills | Status: DC

## 2019-12-14 MED ORDER — ALBUTEROL SULFATE HFA 108 (90 BASE) MCG/ACT IN AERS: INHALATION_SPRAY | RESPIRATORY_TRACT | 1 refills | Status: DC

## 2019-12-14 NOTE — Telephone Encounter (Signed)
I will send in doxycycline 1 tab twice daily x 7 days and prednisone taper starting at 50mg . She needs follow-up with Dr. Elsworth Soho scheduled - recurrent/frequent steriod use.

## 2019-12-14 NOTE — Telephone Encounter (Signed)
Spoke with patient regarding prior message. Advised patient that Beth sent in to medication's to her pharmacy I will send in doxycycline 1 tab twice daily x 7 days and prednisone taper starting at 50mg . Made a f/u appt with Dr.Alva on 01/09/20 at 11:45. Patient needed a refill for her Ventolin [facility] sent into her pharmacy.Asvised patient I sent it to her pharmacy. Patient's voice was understanding . Nothing else further needed.

## 2019-12-14 NOTE — Telephone Encounter (Signed)
Primary Pulmonologist: Dr Elsworth Soho Last office visit and with whom: 11/04/19, Beth,NP What do we see them for (pulmonary problems): COPD Last OV assessment/plan:   Instructions    Return in about 3 months (around 02/04/2020). Pleasure seeing you today Kim [name]  COPD: - Continue Symbicort 160 two puffs twice daily - Continue Spiriva Respimat 2.64mcg two puffs once daily  - Stay on Daliresp 28mcg and stay on this dose - Taper prednisone 10mg  x 2 weeks; then 5mg  x 2 week; then stop  - Take prednisone taper if needed for acute exacerbation while on vacation  (40mg  x 2 days; 30mg  x 2 days; 20mg  x 2 days; 10mg  x 2 days)   Follow-up: - 3 months with Dr. Elsworth Soho or Eustaquio Maize NP          Reason for call:  Called and spoke with Patient.  Patient stated that she has a productive cough, beige phlegm, wheezing, and increased sob, that started Monday, and has gotten worse today.  Patient is on 2L [place] as needed, but has been using O2 continuous sine Monday, and has increased to 3L [place] with exertion. Patient denies any fever, chills, covid exposures, loss of taste, or smell.  Patient has had both Pfizer Covid vaccines earlier this year. Patient stated she normally has to have a prednisone taper starting with 6 tabs x's 3 days,and taper down, with her COPD exacerbations. Patient request [facility] prescriptions be [facility] to [facility].  Message routed to Beth,NP    Allergies  Allergen Reactions  . Ciprofloxacin Other (See Comments)    Hallucinations   . Lisinopril Cough  . Penicillins Hives    Has patient had a PCN reaction causing immediate rash, facial/tongue/throat swelling, SOB or lightheadedness with hypotension: Yes Has patient had a PCN reaction causing severe rash involving mucus membranes or skin necrosis: No Has patient had a PCN reaction that required hospitalization No Has patient had a PCN reaction occurring within the last 10 years: No If all of the above answers are "NO",  then may proceed with Cephalosporin use.     Immunization History  Administered Date(s) Administered  . Influenza Split 01/31/2011, 01/27/2012, 03/25/2014  . Influenza Whole 01/05/2008, 03/12/2009, 03/13/2010  . Influenza,inj,Quad PF,6+ Mos 12/29/2012, 12/20/2014, 01/07/2017, 12/17/2017, 12/17/2018  . Influenza-Unspecified 01/31/2011, 01/27/2012, 12/29/2012, 03/25/2014, 12/20/2014, 02/11/2016, 01/07/2017, 12/17/2017, 12/17/2018  . PFIZER SARS-COV-2 Vaccination 06/29/2019, 07/25/2019  . Pneumococcal Conjugate-13 08/10/2015  . Pneumococcal Polysaccharide-23 05/08/2006  . Tdap 01/31/2011

## 2019-12-30 ENCOUNTER — Other Ambulatory Visit: Payer: Self-pay | Admitting: *Deleted

## 2019-12-30 DIAGNOSIS — Z87891 Personal history of nicotine dependence: Secondary | ICD-10-CM

## 2020-01-09 ENCOUNTER — Encounter: Payer: Self-pay | Admitting: Pulmonary Disease

## 2020-01-09 ENCOUNTER — Other Ambulatory Visit: Payer: Self-pay

## 2020-01-09 ENCOUNTER — Ambulatory Visit (INDEPENDENT_AMBULATORY_CARE_PROVIDER_SITE_OTHER): Payer: Medicare Other | Admitting: Pulmonary Disease

## 2020-01-09 VITALS — BP 148/88 | HR 100 | Temp 98.0°F | Ht 63.0 in | Wt 128.0 lb

## 2020-01-09 DIAGNOSIS — R911 Solitary pulmonary nodule: Secondary | ICD-10-CM | POA: Diagnosis not present

## 2020-01-09 DIAGNOSIS — Z23 Encounter for immunization: Secondary | ICD-10-CM | POA: Diagnosis not present

## 2020-01-09 DIAGNOSIS — J441 Chronic obstructive pulmonary disease with (acute) exacerbation: Secondary | ICD-10-CM

## 2020-01-09 DIAGNOSIS — I2510 Atherosclerotic heart disease of native coronary artery without angina pectoris: Secondary | ICD-10-CM

## 2020-01-09 DIAGNOSIS — J9611 Chronic respiratory failure with hypoxia: Secondary | ICD-10-CM | POA: Diagnosis not present

## 2020-01-09 MED ORDER — PREDNISONE 10 MG PO TABS
10.0000 mg | ORAL_TABLET | Freq: Every day | ORAL | 1 refills | Status: DC
Start: 2020-01-09 — End: 2020-03-05

## 2020-01-09 NOTE — Patient Instructions (Signed)
Prednisone 10 mg tabs Take 4 tabs  daily with food x 4 days, then 3 tabs daily x 4 days, then 2 tabs daily x 4 days, then 1 tab daily -stay at this dose until seen again # 90 with 1 refill  Stay on Symbicort and Spiriva. Albuterol nebs as needed.  High-dose flu shot today. Okay to take Covid booster shot in 2 weeks

## 2020-01-09 NOTE — Progress Notes (Signed)
   Subjective:    Patient ID: Kim [name], female    DOB: [birth year], 64 y.o.   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   COPD exacerbations: June 2020- COPD exacerbation/Prednisone + zpack September- prednisone PCP December 2020- Prednisone + zpack ? PCP January 18th 2021- COPD exacerbation/Prednisone + doxycycline  February 5th 2021- COPD exacerbation/Prednisone + Zpack  February 9th 2021- Prolonged COPD exacerbation requiring high dose prednisone/ CXR ordered  April 4th- Hospitalized for COPD exacerbation treated with IV steriods and oral prednisone taper 08/12/2019 - Zpack and extrended prednisone taper  ED visit on 6/12   Any exacerbations this year, on her last visit 09/2019, we placed her on low-dose steroids for a prolonged.,  This was tapered to off end of August.  She returns now with increased dyspnea on exertion. Cough is productive of clearish phlegm Intermittent wheezing, using nebs twice daily in addition to Symbicort and Spiriva with which she is compliant.  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  No other exposures in the environment. She has a 22 year old dog at home, has been chews, nobody smokes around her   Chest x-ray 09/2019 independently reviewed, hyperinflation, no new infiltrate  Significant tests/ events reviewed  11/2014: FVC 2.00 L (63%) FEV1 1.00 L (41%)ratio50 negative bronchodilator response TLC 5.06 L (103%) RV 140% DLCO uncorrected 51%  12/2018 LD CT chest - stable nodules  10/2017 CT chest-stable nodules, adrenal adenoma 10/2016 CT chest showed subcentimeter nodules which have been stable since 2017, right adrenal nodule was stable since 2015 and considered benign    Review of Systems neg for any significant sore throat, dysphagia, itching, sneezing, nasal congestion or excess/  purulent secretions, fever, chills, sweats, unintended wt loss, pleuritic or exertional cp, hempoptysis, orthopnea pnd or change in chronic leg swelling. Also denies presyncope, palpitations, heartburn, abdominal pain, nausea, vomiting, diarrhea or change in bowel or urinary habits, dysuria,hematuria, rash, arthralgias, visual complaints, headache, numbness weakness or ataxia.     Objective:   Physical Exam   Gen. Pleasant, elderly,thin,, in no distress, normal affect ENT - no pallor,icterus, no post nasal drip, class 2-3 airway Neck: No JVD, no thyromegaly, no carotid bruits Lungs: no use of accessory muscles, no dullness to percussion, BL scattered rhonchi  Cardiovascular: Rhythm regular, heart sounds  normal, no murmurs or gallops, no peripheral edema Abdomen: soft and non-tender, no hepatosplenomegaly, BS normal. Musculoskeletal: No deformities, no cyanosis or clubbing Neuro:  alert, non focal, no tremors        Assessment & Plan:

## 2020-01-09 NOTE — Assessment & Plan Note (Signed)
Continue on oxygen, maintain saturation 90% and above. No evidence of cor pulmonale  High-dose flu shot today. Okay to take Covid booster shot in 2 weeks

## 2020-01-09 NOTE — Assessment & Plan Note (Addendum)
Prednisone 10 mg tabs Take 4 tabs  daily with food x 4 days, then 3 tabs daily x 4 days, then 2 tabs daily x 4 days, then 1 tab daily -stay at this dose until seen again # 90 with 1 refill  Stay on Symbicort and Spiriva. Albuterol nebs as needed.  Not sure what is causing her recurrent flares, she did not seem to tolerate Daliresp in the past. She will try to stay away from the house that she is trying to help clean

## 2020-01-09 NOTE — Assessment & Plan Note (Signed)
Annual follow-up low-dose CT

## 2020-01-10 ENCOUNTER — Other Ambulatory Visit: Payer: Self-pay | Admitting: Adult Health

## 2020-01-10 ENCOUNTER — Other Ambulatory Visit: Payer: Self-pay | Admitting: Cardiology

## 2020-01-10 DIAGNOSIS — F419 Anxiety disorder, unspecified: Secondary | ICD-10-CM

## 2020-01-31 ENCOUNTER — Ambulatory Visit
Admission: RE | Admit: 2020-01-31 | Discharge: 2020-01-31 | Disposition: A | Discharge status: Home / Self Care | Payer: Medicare Other | Source: Ambulatory Visit | Attending: Acute Care | Admitting: Acute Care

## 2020-01-31 DIAGNOSIS — Z87891 Personal history of nicotine dependence: Secondary | ICD-10-CM

## 2020-02-07 ENCOUNTER — Other Ambulatory Visit: Payer: Self-pay | Admitting: *Deleted

## 2020-02-07 DIAGNOSIS — Z87891 Personal history of nicotine dependence: Secondary | ICD-10-CM

## 2020-02-10 ENCOUNTER — Ambulatory Visit: Payer: Medicare Other | Admitting: Primary Care

## 2020-02-14 ENCOUNTER — Other Ambulatory Visit: Payer: Self-pay | Admitting: Pulmonary Disease

## 2020-02-14 DIAGNOSIS — J9611 Chronic respiratory failure with hypoxia: Secondary | ICD-10-CM

## 2020-02-14 DIAGNOSIS — J449 Chronic obstructive pulmonary disease, unspecified: Secondary | ICD-10-CM

## 2020-02-17 ENCOUNTER — Telehealth (INDEPENDENT_AMBULATORY_CARE_PROVIDER_SITE_OTHER): Payer: Medicare Other | Admitting: Adult Health

## 2020-02-17 DIAGNOSIS — M8949 Other hypertrophic osteoarthropathy, multiple sites: Secondary | ICD-10-CM | POA: Diagnosis not present

## 2020-02-17 DIAGNOSIS — M4854XA Collapsed vertebra, not elsewhere classified, thoracic region, initial encounter for fracture: Secondary | ICD-10-CM | POA: Diagnosis not present

## 2020-02-17 DIAGNOSIS — G8929 Other chronic pain: Secondary | ICD-10-CM

## 2020-02-17 DIAGNOSIS — M159 Polyosteoarthritis, unspecified: Secondary | ICD-10-CM

## 2020-02-17 MED ORDER — OXYCODONE HCL 5 MG PO TABS: 5.0000 mg | ORAL_TABLET | Freq: Four times a day (QID) | ORAL | 0 refills | Status: AC | PRN

## 2020-02-17 NOTE — Progress Notes (Signed)
  Virtual Visit via Telephone Note  I connected with Kim [name] on 02/17/20 at  4:00 PM EST by telephone and verified that I am speaking with the correct person using two identifiers.   I discussed the limitations, risks, security and privacy concerns of performing an evaluation and management service by telephone and the availability of in person appointments. I also discussed with the patient that there may be a patient responsible charge related to this service. The patient expressed understanding and agreed to proceed.  Location patient: home Location provider: work or home office Participants present for the call: patient, provider Patient did not have a visit in the prior 7 days to address this/these issue(s).   History of Present Illness: NCCSRS reviewed in EPIC   Indication for chronic opioid: Osteoarthritis involving multiple joints Medication and dose: Norco 7.4-325 mg, every 6 hours PRN # pills per month: 45 Last UDS date: 10/2019 Opioid Treatment Agreement signed: Yes Opioid Treatment Agreement last reviewed with patient: Today [place] reviewed this encounter (include red flags):  reviewed and no red flags noted.   Normally her pain is well controlled but a few weeks ago when she was picking up a heavy cast iron pan and felt some mid back pain.  She went to [facility] walk-in clinic and was diagnosed with a compression fracture of the thoracic spine.  Reports that current pain medication is "not touching this pain".  She is also taking prescribed Robaxin that was given to her by orthopedics and this is not doing much for her.  She does have a follow-up appointment coming up but reports that she is constantly in pain especially when laying in bed.    Observations/Objective: Patient sounds cheerful and well on the phone. I do not appreciate any SOB. Speech and thought processing are grossly intact. Patient reported vitals:  Assessment and Plan: 1. Encounter for chronic  pain management  - DRUG MONITORING, PANEL 8 WITH CONFIRMATION, URINE; Future  2. Primary osteoarthritis involving multiple joints  - DRUG MONITORING, PANEL 8 WITH CONFIRMATION, URINE; Future  3. Nontraumatic compression fracture of T7 vertebra, initial encounter ([place]) -Send in oxycodone 5 mg immediate release tablets for breakthrough pain.  She was advised against taking this with her prescribed Norco as well as Robaxin as it can decrease her drive to breathe.  She will only use it for breakthrough pain as needed. - oxyCODONE (ROXICODONE) 5 MG immediate release tablet; Take 1 tablet (5 mg total) by mouth every 6 (six) hours as needed for up to 7 days for severe pain or breakthrough pain (for breakthrough pain).  Dispense: 20 tablet; Refill: 0   Follow Up Instructions:  I did not refer this patient for an OV in the next 24 hours for this/these issue(s).  I discussed the assessment and treatment plan with the patient. The patient was provided an opportunity to ask questions and all were answered. The patient agreed with the plan and demonstrated an understanding of the instructions.   The patient was advised to call back or seek an in-person evaluation if the symptoms worsen or if the condition fails to improve as anticipated.  I provided 20 minutes of non-face-to-face time during this encounter.   [name], NP

## 2020-02-20 NOTE — Addendum Note (Signed)
Addended by: Marrion Coy on: 02/20/2020 09:33 AM   Modules accepted: Orders

## 2020-02-21 ENCOUNTER — Other Ambulatory Visit: Payer: Medicare Other

## 2020-02-21 ENCOUNTER — Other Ambulatory Visit: Payer: Self-pay

## 2020-02-21 DIAGNOSIS — G8929 Other chronic pain: Secondary | ICD-10-CM

## 2020-02-22 LAB — DRUG MONITOR, PANEL 1, SCREEN, URINE
Amphetamines: NEGATIVE ng/mL (ref <500)
Barbiturates: NEGATIVE ng/mL (ref <300)
Benzodiazepines: POSITIVE ng/mL — AB (ref <100)
Cocaine Metabolite: NEGATIVE ng/mL (ref <150)
Creatinine: 135.6 mg/dL
Marijuana Metabolite: NEGATIVE ng/mL (ref <20)
Methadone Metabolite: NEGATIVE ng/mL (ref <100)
Opiates: POSITIVE ng/mL — AB (ref <100)
Oxidant: NEGATIVE ug/mL
Oxycodone: POSITIVE ng/mL — AB (ref <100)
Phencyclidine: NEGATIVE ng/mL (ref <25)
pH: 5.9 (ref 4.5–9.0)

## 2020-02-22 LAB — DM TEMPLATE

## 2020-02-22 LAB — DRUG MONITOR, ALCOHOLMETAB, W/CONF, URINE: Alcohol Metabolites: NEGATIVE ng/mL

## 2020-02-23 ENCOUNTER — Other Ambulatory Visit: Payer: Self-pay | Admitting: Adult Health

## 2020-02-23 DIAGNOSIS — M159 Polyosteoarthritis, unspecified: Secondary | ICD-10-CM

## 2020-02-23 DIAGNOSIS — M8949 Other hypertrophic osteoarthropathy, multiple sites: Secondary | ICD-10-CM

## 2020-02-23 MED ORDER — HYDROCODONE-ACETAMINOPHEN 7.5-325 MG PO TABS: 1.0000 | ORAL_TABLET | Freq: Two times a day (BID) | ORAL | 0 refills | Status: DC | PRN

## 2020-02-23 MED ORDER — HYDROCODONE-ACETAMINOPHEN 7.5-325 MG PO TABS: 1.0000 | ORAL_TABLET | Freq: Four times a day (QID) | ORAL | 0 refills | Status: DC | PRN

## 2020-03-05 ENCOUNTER — Other Ambulatory Visit: Payer: Self-pay

## 2020-03-05 ENCOUNTER — Encounter: Payer: Self-pay | Admitting: Pulmonary Disease

## 2020-03-05 ENCOUNTER — Ambulatory Visit (INDEPENDENT_AMBULATORY_CARE_PROVIDER_SITE_OTHER): Payer: Medicare Other | Admitting: Pulmonary Disease

## 2020-03-05 ENCOUNTER — Telehealth: Payer: Self-pay | Admitting: *Deleted

## 2020-03-05 DIAGNOSIS — I2510 Atherosclerotic heart disease of native coronary artery without angina pectoris: Secondary | ICD-10-CM | POA: Diagnosis not present

## 2020-03-05 DIAGNOSIS — J441 Chronic obstructive pulmonary disease with (acute) exacerbation: Secondary | ICD-10-CM

## 2020-03-05 DIAGNOSIS — M8008XA Age-related osteoporosis with current pathological fracture, vertebra(e), initial encounter for fracture: Secondary | ICD-10-CM

## 2020-03-05 MED ORDER — PREDNISONE 5 MG PO TABS: 5.0000 mg | ORAL_TABLET | Freq: Every day | ORAL | 0 refills | Status: DC

## 2020-03-05 NOTE — Progress Notes (Signed)
° °  Subjective:    Patient ID: Kim [name], female    DOB: 06-11-1955, 64 y.o.   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   COPD exacerbations: June 2020- COPD exacerbation/Prednisone + zpack September- prednisone PCP December 2020- Prednisone + zpack ? PCP January 18th 2021- COPD exacerbation/Prednisone + doxycycline  Feb 5th 2021- COPD exacerbation/Prednisone + Zpack  Feb 9th 2021- Prolonged COPD exacerbation requiring high dose prednisone/ CXR ordered  April 4th- Hospitalized for COPD exacerbation treated with IV steriods and oral prednisone taper 08/12/2019 - Zpack and extrended prednisone taper   09/2019>> low-dose steroids - tapered to off by end August.   01/2020 OV >> pred 40 mg, settling at 10  She remains on 10 mg of prednisone. She developed sudden onset back pain last week, she was moving a heavy disabled child, went to see orthopedics.  Reviewed CT scan which shows T7 tibial fracture but pain appears to be in the low back area.  Dr. Maxie Better felt it could be another fracture.  Breathing has been good and 10 mg of prednisone.  Saturation 93% on room air today     Significant tests/ events reviewed  11/2014: FVC 2.00 L (63%) FEV1 1.00 L (41%)ratio50 negative bronchodilator response TLC 5.06 L (103%) RV 140% DLCO uncorrected 51%  01/2020 LDCT - stable, T7 fracture 12/2018 LD CT chest - stable nodules  10/2017 CT chest-stable nodules, adrenal adenoma 10/2016 CT chest showed subcentimeter nodules which have been stable since 2017, right adrenal nodule was stable since 2015 and considered benign  Review of Systems neg for any significant sore throat, dysphagia, itching, sneezing, nasal congestion or excess/ purulent secretions, fever, chills, sweats, unintended wt loss, pleuritic or exertional cp, hempoptysis, orthopnea pnd or change in chronic leg swelling. Also denies presyncope,  palpitations, heartburn, abdominal pain, nausea, vomiting, diarrhea or change in bowel or urinary habits, dysuria,hematuria, rash, arthralgias, visual complaints, headache, numbness weakness or ataxia.     Objective:   Physical Exam  Gen. Pleasant, well-nourished, in no distress ENT - no thrush, no pallor/icterus,no post nasal drip Neck: No JVD, no thyromegaly, no carotid bruits Lungs: no use of accessory muscles, no dullness to percussion,decreased BL  without rales or rhonchi  Cardiovascular: Rhythm regular, heart sounds  normal, no murmurs or gallops, no peripheral edema Musculoskeletal: No deformities, no cyanosis or clubbing        Assessment & Plan:

## 2020-03-05 NOTE — Assessment & Plan Note (Signed)
Unfortunately she does need steroids since she had so many exacerbations this year.  However we will attempt to taper this off again. I have asked her to decrease to 5 mg with the goal of getting her off steroids completely within the next 2 months She will continue on Symbicort and Spiriva

## 2020-03-05 NOTE — Assessment & Plan Note (Signed)
Unfortunately she has developed osteoporosis with chronic steroid use.  CT shows T7 vertebral fracture.  She developed back pain last week and this seems to be in the low back area so does not seem to be related to the T7 fracture. Regardless she will need bone scan to further define osteoporosis and likely need a bisphosphonate. Meanwhile asked her to take calcium and vitamin D. She will discuss with PCP further

## 2020-03-05 NOTE — Telephone Encounter (Signed)
Called [employer] regarding humidifying bottle for oxygen concentrator, spoke with Ashontie.  She stated to just send a regular script for the humidification bottle and they will send it out.  Script sent.

## 2020-03-05 NOTE — Patient Instructions (Signed)
Stay on Symbicort and Spiriva. Decrease prednisone to 5 mg daily all of December. On January 1, decrease to 5 mg every other day-Monday/Wednesday/Friday Starting February 1, stop taking prednisone

## 2020-03-21 NOTE — Progress Notes (Signed)
Subjective:   Kim [name] is a 64 y.o. female who presents for an Initial Medicare Annual Wellness Visit.  Review of Systems    N/A  Cardiac Risk Factors include: advanced age (>83men, >40 women);dyslipidemia     Objective:    Today's Vitals   03/22/20 1457  BP: 124/64  Pulse: 99  Temp: 98.1 F (36.7 C)  TempSrc: Oral  SpO2: 93%  Weight: 129 lb 6 oz (58.7 kg)  Height: 5\' 3"  (1.6 m)   Body mass index is 22.92 kg/m.  Advanced Directives 03/22/2020 07/11/2019 07/10/2019 05/14/2018 02/09/2017 01/14/2017 01/14/2017  Does Patient Have a Medical Advance Directive? No No No No No No No  Would patient like information on creating a medical advance directive? Yes (MAU/Ambulatory/Procedural Areas - Information given) No - Patient declined No - Patient declined No - Patient declined No - Patient declined No - Patient declined -    Current Medications (verified) Outpatient Encounter Medications as of 03/22/2020  Medication Sig  . albuterol (VENTOLIN HFA) 108 (90 Base) MCG/ACT inhaler Take two puffs every 4-6 hours as needed for shortness of breath/wheezing . 90 day supply  . ALPRAZolam (XANAX) 0.5 MG tablet TAKE 1/2 TO 1 TABLET BY MOUTH THREE TIMES DAILY  . Ascorbic Acid (VITAMIN C PO) Take 1 tablet by mouth daily.  Marland Kitchen aspirin EC 81 MG EC tablet Take 1 tablet (81 mg total) by mouth daily.  . budesonide-formoterol (SYMBICORT) 160-4.5 MCG/ACT inhaler Inhale 2 puffs into the lungs 2 (two) times daily.  . Calcium Carbonate-Vit D-Min (CALCIUM 1200 PO) Take by mouth.  . Cholecalciferol (VITAMIN D3) 50 MCG (2000 UT) TABS Take 2,000 Units by mouth daily.  . cyanocobalamin 2000 MCG tablet Take 2,000 mcg by mouth daily. Vitamin B12  . furosemide (LASIX) 40 MG tablet Take 1 tablet (40 mg total) by mouth daily as needed (for weight gain of 3 lbs). (Patient taking differently: Take 20-40 mg by mouth daily as needed (for overnight weight gain of 3 lbs 1/2 tablet (20 mg), for overnight weight gain of 5  lbs 1 tablet (40 mg)).)  . Ginkgo Biloba Extract 60 MG CAPS Take 60 mg by mouth daily.  Marland Kitchen HYDROcodone-acetaminophen (NORCO) 7.5-325 MG tablet Take 1 tablet by mouth 2 (two) times daily as needed (pain/cough).  Marland Kitchen ipratropium-albuterol (DUONEB) 0.5-2.5 (3) MG/3ML SOLN USE 3 ML VIA NEBULIZER EVERY 6 HOURS AS NEEDED  . losartan (COZAAR) 25 MG tablet TAKE 1 TABLET EVERY DAY  . magnesium oxide (MAG-OX) 400 MG tablet Take 400 mg by mouth daily.  . OXYGEN Inhale 2 L into the lungs continuous.  Marland Kitchen PARoxetine (PAXIL) 30 MG tablet Take 1 tablet (30 mg total) by mouth daily.  . predniSONE (DELTASONE) 5 MG tablet Take 1 tablet (5 mg total) by mouth daily with breakfast. December 5 mg daily.  January 1st decrease to 5 mg every other day.  Marland Kitchen Spacer/Aero Chamber Mouthpiece MISC 1 Device by Does not apply route as directed.  . Tiotropium Bromide Monohydrate (SPIRIVA RESPIMAT) 2.5 MCG/ACT AERS USE 2 INHALATIONS BY MOUTH  DAILY (Patient taking differently: Inhale 2 puffs into the lungs daily at 12 noon.)  . HYDROcodone-acetaminophen (NORCO) 7.5-325 MG tablet Take 1 tablet by mouth every 6 (six) hours as needed for moderate pain. (Patient not taking: Reported on 03/22/2020)  . HYDROcodone-acetaminophen (NORCO) 7.5-325 MG tablet Take 1 tablet by mouth every 6 (six) hours as needed for moderate pain. (Patient not taking: Reported on 03/22/2020)  . methocarbamol (ROBAXIN) 500 MG  tablet Robaxin 500 mg tablet  Take 1 tablet twice a day by oral route as needed for 10 days. (Patient not taking: Reported on 03/22/2020)  . nitroGLYCERIN (NITROSTAT) 0.4 MG SL tablet Place 1 tablet (0.4 mg total) under the tongue every 5 (five) minutes as needed for chest pain. (Patient not taking: Reported on 03/22/2020)  . [DISCONTINUED] methocarbamol (ROBAXIN) 500 MG tablet Take 500 mg by mouth 4 (four) times daily. (Patient not taking: Reported on 03/22/2020)  . [DISCONTINUED] PARoxetine (PAXIL-CR) 25 MG 24 hr tablet Take 25 mg by mouth daily.    No facility-administered encounter medications on file as of 03/22/2020.    Allergies (verified) Ciprofloxacin, Lisinopril, and Penicillins   History: Past Medical History:  Diagnosis Date  . C8 RADICULOPATHY 06/05/2009  . COPD 08/31/2007  . DEPRESSION 11/19/2006  . DYSLIPIDEMIA 03/12/2009  . MI (mitral incompetence)   . 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 OSTEOARTHRITIS 06/05/2009  . PARESTHESIA 08/24/2007  . TOBACCO ABUSE 01/25/2010   Past Surgical History:  Procedure Laterality Date  . ABDOMINAL HYSTERECTOMY    . APPENDECTOMY  1969  . CARDIAC CATHETERIZATION N/A 07/05/2015   Procedure: Left Heart Cath and Coronary Angiography;  Surgeon: Leonie Man, MD;  Location: [facility];  Service: Cardiovascular;  Laterality: N/A;  . CHOLECYSTECTOMY  1989  . collapse lung  1990  . COLONOSCOPY WITH PROPOFOL N/A 05/14/2018   Procedure: COLONOSCOPY WITH PROPOFOL;  Surgeon: Irene Shipper, MD;  Location: [facility];  Service: Endoscopy;  Laterality: N/A;  . EXCISION MASS ABDOMINAL  2019  . EXPLORATORY LAPAROTOMY  1969  . POLYPECTOMY  05/14/2018   Procedure: POLYPECTOMY;  Surgeon: Irene Shipper, MD;  Location: [facility];  Service: Endoscopy;;  . SALPINGOOPHORECTOMY  2019   Family History  Problem Relation Age of Onset  . Cancer Mother        lung  . Heart attack Mother   . Hypertension Sister   . Multiple sclerosis Brother   . Diabetes Sister   . Rheum arthritis Sister   . Thyroid disease Sister   . Multiple sclerosis Other   . Alcohol abuse Other   . Arthritis Other   . Diabetes Other   . Kidney disease Other   . Cancer Other        lung,ovarian,skin, uterine  . Stroke Other   . Heart disease Other   . Melanoma Other   . Heart attack Maternal Uncle   . Heart attack Other        NEICE  . Heart attack Other        NEPHEW  . Hypertension Brother   . Stroke Maternal Grandmother   . Colon cancer Neg Hx    Social History   Socioeconomic History  . Marital status:  Married    Spouse name: [name]  . Number of children: 2  . Years of education: Not on file  . Highest education level: Not on file  Occupational History  . Occupation: Patient care aide-sits with elderly lady  Tobacco Use  . Smoking status: Former Smoker    Packs/day: 2.00    Years: 38.00    Pack years: 76.00    Types: Cigarettes    Quit date: 03/07/2014    Years since quitting: 6.0  . Smokeless tobacco: Never Used  Vaping Use  . Vaping Use: Never used  Substance and Sexual Activity  . Alcohol use: No    Alcohol/week: 0.0 standard drinks  . Drug use: No  .  Sexual activity: Not on file  Other Topics Concern  . Not on file  Social History Narrative   [facility] (01/12/17):   Originally from [facility] Has lived in [place] for 17 years. Married. Has 2 dogs currently. No mold and only 1 indoor plant. Had her home professionally cleaned a month ago. No bird or hot tub exposure. 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    Social Determinants of Health   Financial Resource Strain: Low Risk   . Difficulty of Paying Living Expenses: Not hard at all  Food Insecurity: No Food Insecurity  . Worried About Charity fundraiser in the Last Year: Never true  . Ran Out of Food in the Last Year: Never true  Transportation Needs: No Transportation Needs  . Lack of Transportation (Medical): No  . Lack of Transportation (Non-Medical): No  Physical Activity: Inactive  . Days of Exercise per Week: 0 days  . Minutes of Exercise per Session: 0 min  Stress: No Stress Concern Present  . Feeling of Stress : Not at all  Social Connections: Moderately Integrated  . Frequency of Communication with Friends and Family: More than three times a week  . Frequency of Social Gatherings with Friends and Family: More than three times a week  . Attends Religious Services: More than 4 times per year  . Active Member of Clubs or Organizations: No  . Attends Archivist Meetings:  Never  . Marital Status: Married    Tobacco Counseling Counseling given: Not Answered   Clinical Intake:  Pre-visit preparation completed: Yes  Pain : No/denies pain     Nutritional Risks: None Diabetes: No  How often do you need to have someone help you when you read instructions, pamphlets, or other written materials from your doctor or pharmacy?: 1 - Never What is the last grade level you completed in school?: 2 years of college  Diabetic?No   Interpreter Needed?: No  Information entered by :: [place] of Daily Living In your present state of health, do you have any difficulty performing the following activities: 03/22/2020 07/11/2019  Hearing? N N  Vision? N N  Difficulty concentrating or making decisions? N N  Walking or climbing stairs? Y N  Comment gets sob -  Dressing or bathing? N N  Doing errands, shopping? N N  Preparing Food and eating ? N -  Using the Toilet? N -  In the past six months, have you accidently leaked urine? N -  Do you have problems with loss of bowel control? N -  Managing your Medications? N -  Managing your Finances? N -  Housekeeping or managing your Housekeeping? N -  Some recent data might be hidden    Patient Care Team: [name], NP as PCP - General (Family Medicine) [name], MD as PCP - Cardiology (Cardiology)  Indicate any recent Medical Services you may have received from other than [facility] providers in the past year (date may be approximate).     Assessment:   This is a routine wellness examination for Braylynn.  Hearing/Vision screen  Hearing Screening   125Hz  250Hz  500Hz  1000Hz  2000Hz  3000Hz  4000Hz  6000Hz  8000Hz   Right ear:           Left ear:           Vision Screening Comments: Patient states gets eyes checked every 2 years  Dietary issues and exercise activities discussed: Current Exercise Habits: The  patient does not participate in regular exercise at present, Exercise limited  by: respiratory conditions(s);orthopedic condition(s)  Goals    . Exercise 3x per week (30 min per time)      Depression Screen PHQ 2/9 Scores 03/22/2020 03/19/2018 03/26/2017 02/09/2017  PHQ - 2 Score 0 0 2 3  PHQ- 9 Score 0 - 8 9    Fall Risk Fall Risk  03/22/2020 03/19/2018 02/09/2017  Falls in the past year? 0 0 No  Number falls in past yr: 0 - -  Injury with Fall? 0 - -  Risk for fall due to : No Fall Risks - -  Follow up Falls evaluation completed;Falls prevention discussed - -    FALL RISK PREVENTION PERTAINING TO THE HOME:  Any stairs in or around the home? Yes  If so, are there any without handrails? No  Home free of loose throw rugs in walkways, pet beds, electrical cords, etc? Yes  Adequate lighting in your home to reduce risk of falls? Yes   ASSISTIVE DEVICES UTILIZED TO PREVENT FALLS:  Life alert? No  Use of a cane, walker or w/c? No  Grab bars in the bathroom? No  Shower chair or bench in shower? No  Elevated toilet seat or a handicapped toilet? No   TIMED UP AND GO:  Was the test performed? Yes .  Length of time to ambulate 10 feet: 4 sec.   Gait steady and fast without use of assistive device  Cognitive Function:   Normal cognitive status assessed by direct observation by this Nurse Health Advisor. No abnormalities found.        Immunizations Immunization History  Administered Date(s) Administered  . Influenza Split 01/31/2011, 01/27/2012, 03/25/2014  . Influenza Whole 01/05/2008, 03/12/2009, 03/13/2010  . Influenza,inj,Quad PF,6+ Mos 12/29/2012, 12/20/2014, 01/07/2017, 12/17/2017, 12/17/2018, 01/09/2020  . Influenza-Unspecified 01/31/2011, 01/27/2012, 12/29/2012, 03/25/2014, 12/20/2014, 02/11/2016, 01/07/2017, 12/17/2017, 12/17/2018  . PFIZER SARS-COV-2 Vaccination 06/29/2019, 07/25/2019  . Pneumococcal Conjugate-13 08/10/2015  . Pneumococcal Polysaccharide-23 05/08/2006, 03/22/2020  . Tdap 01/31/2011    TDAP status: Up to date  Flu  Vaccine status: Up to date  Pneumococcal vaccine status: Up to date  Covid-19 vaccine status: Completed vaccines  Qualifies for Shingles Vaccine? Yes   Zostavax completed No   Shingrix Completed?: No.    Education has been provided regarding the importance of this vaccine. Patient has been advised to call insurance company to determine out of pocket expense if they have not yet received this vaccine. Advised may also receive vaccine at local pharmacy or Health Dept. Verbalized acceptance and understanding.  Screening Tests Health Maintenance  Topic Date Due  . Hepatitis C Screening  Never done  . MAMMOGRAM  08/08/2018  . DEXA SCAN  Never done  . COVID-19 Vaccine (3 - Booster for Pfizer series) 01/24/2020  . TETANUS/TDAP  01/30/2021  . COLONOSCOPY  05/15/2023  . INFLUENZA VACCINE  Completed  . PNA vac Low Risk Adult  Completed    Health Maintenance  Health Maintenance Due  Topic Date Due  . Hepatitis C Screening  Never done  . MAMMOGRAM  08/08/2018  . DEXA SCAN  Never done  . COVID-19 Vaccine (3 - Booster for Pfizer series) 01/24/2020    Colorectal cancer screening: Type of screening: Colonoscopy. Completed 05/14/2018. Repeat every 10 years  Mammogram status: Ordered 03/22/2020. Pt provided with contact info and advised to call to schedule appt.   Bone Density status: Ordered 03/22/2020. Pt provided with contact info and advised to call to  schedule appt.  Lung Cancer Screening: (Low Dose CT Chest recommended if Age 34-80 years, 30 pack-year currently smoking OR have quit w/in 15years.) does not qualify.   Lung Cancer Screening Referral: N/A   Additional Screening:  Hepatitis C Screening: does qualify;   Vision Screening: Recommended annual ophthalmology exams for early detection of glaucoma and other disorders of the eye. Is the patient up to date with their annual eye exam?  Yes  Who is the provider or what is the name of the office in which the patient attends annual  eye exams? [facility] on [address] If pt is not established with a provider, would they like to be referred to a provider to establish care? No .   Dental Screening: Recommended annual dental exams for proper oral hygiene  Community Resource Referral / Chronic Care Management: CRR required this visit?  No   CCM required this visit?  No      Plan:     I have personally reviewed and noted the following in the patient's chart:   . Medical and social history . Use of alcohol, tobacco or illicit drugs  . Current medications and supplements . Functional ability and status . Nutritional status . Physical activity . Advanced directives . List of other physicians . Hospitalizations, surgeries, and ER visits in previous 12 months . Vitals . Screenings to include cognitive, depression, and falls . Referrals and appointments  In addition, I have reviewed and discussed with patient certain preventive protocols, quality metrics, and best practice recommendations. A written personalized care plan for preventive services as well as general preventive health recommendations were provided to patient.     Ofilia Neas, LPN   [id number]   Nurse Notes: None

## 2020-03-22 ENCOUNTER — Ambulatory Visit (INDEPENDENT_AMBULATORY_CARE_PROVIDER_SITE_OTHER): Payer: Medicare Other

## 2020-03-22 ENCOUNTER — Other Ambulatory Visit: Payer: Self-pay

## 2020-03-22 VITALS — BP 124/64 | HR 99 | Temp 98.1°F | Ht 63.0 in | Wt 129.4 lb

## 2020-03-22 DIAGNOSIS — Z780 Asymptomatic menopausal state: Secondary | ICD-10-CM

## 2020-03-22 DIAGNOSIS — Z1231 Encounter for screening mammogram for malignant neoplasm of breast: Secondary | ICD-10-CM | POA: Diagnosis not present

## 2020-03-22 DIAGNOSIS — Z23 Encounter for immunization: Secondary | ICD-10-CM | POA: Diagnosis not present

## 2020-03-22 DIAGNOSIS — Z0000 Encounter for general adult medical examination without abnormal findings: Secondary | ICD-10-CM

## 2020-03-22 NOTE — Patient Instructions (Signed)
Kim [name] , Thank you for taking time to come for your Medicare Wellness Visit. I appreciate your ongoing commitment to your health goals. Please review the following plan we discussed and let me know if I can assist you in the future.   Screening recommendations/referrals: Colonoscopy: Up to date, next due 05/14/2028 Mammogram: Currently due orders placed this visit Bone Density: Currently due orders placed this visit  Recommended yearly ophthalmology/optometry visit for glaucoma screening and checkup Recommended yearly dental visit for hygiene and checkup  Vaccinations: Influenza vaccine: Up to date, next due fall 2022 Pneumococcal vaccine: Pneumovax 23 given at this visit today Tdap vaccine: Up to date, next due 01/30/2021 Shingles vaccine: Currently due for Shingrix, if you wish to receive we recommend that you do so at your local pharmacy as it is less expensive     Advanced directives: Please complete Advanced Directive paperwork   Conditions/risks identified: None   Next appointment: 03/25/2021 @ 2:45 PM with [name], Female Preventive care refers to lifestyle choices and visits with your health care provider that can promote health and wellness. What does preventive care include?  A yearly physical exam. This is also called an annual well check.  Dental exams once or twice a year.  Routine eye exams. Ask your health care provider how often you should have your eyes checked.  Personal lifestyle choices, including:  Daily care of your teeth and gums.  Regular physical activity.  Eating a healthy diet.  Avoiding tobacco and drug use.  Limiting alcohol use.  Practicing safe sex.  Taking low-dose aspirin every day.  Taking vitamin and mineral supplements as recommended by your health care provider. What happens during an annual well check? The services and screenings done by your health care provider  during your annual well check will depend on your age, overall health, lifestyle risk factors, and family history of disease. Counseling  Your health care provider may ask you questions about your:  Alcohol use.  Tobacco use.  Drug use.  Emotional well-being.  Home and relationship well-being.  Sexual activity.  Eating habits.  History of falls.  Memory and ability to understand (cognition).  Work and work Statistician.  Reproductive health. Screening  You may have the following tests or measurements:  Height, weight, and BMI.  Blood pressure.  Lipid and cholesterol levels. These may be checked every 5 years, or more frequently if you are over 39 years old.  Skin check.  Lung cancer screening. You may have this screening every year starting at age 52 if you have a 30-pack-year history of smoking and currently smoke or have quit within the past 15 years.  Fecal occult blood test (FOBT) of the stool. You may have this test every year starting at age 45.  Flexible sigmoidoscopy or colonoscopy. You may have a sigmoidoscopy every 5 years or a colonoscopy every 10 years starting at age 77.  Hepatitis C blood test.  Hepatitis B blood test.  Sexually transmitted disease (STD) testing.  Diabetes screening. This is done by checking your blood sugar (glucose) after you have not eaten for a while (fasting). You may have this done every 1-3 years.  Bone density scan. This is done to screen for osteoporosis. You may have this done starting at age 38.  Mammogram. This may be done every 1-2 years. Talk to your health care provider about how often you should have regular mammograms. Talk with your  health care provider about your test results, treatment options, and if necessary, the need for more tests. Vaccines  Your health care provider may recommend certain vaccines, such as:  Influenza vaccine. This is recommended every year.  Tetanus, diphtheria, and acellular pertussis  (Tdap, Td) vaccine. You may need a Td booster every 10 years.  Zoster vaccine. You may need this after age 18.  Pneumococcal 13-valent conjugate (PCV13) vaccine. One dose is recommended after age 9.  Pneumococcal polysaccharide (PPSV23) vaccine. One dose is recommended after age 70. Talk to your health care provider about which screenings and vaccines you need and how often you need them. This information is not intended to replace advice given to you by your health care provider. Make sure you discuss any questions you have with your health care provider. Document Released: 04/20/2015 Document Revised: 12/12/2015 Document Reviewed: 01/23/2015 Elsevier Interactive Patient Education  2017 [employer] Prevention in the Home Falls can cause injuries. They can happen to people of all ages. There are many things you can do to make your home safe and to help prevent falls. What can I do on the outside of my home?  Regularly fix the edges of walkways and driveways and fix any cracks.  Remove anything that might make you trip as you walk through a door, such as a raised step or threshold.  Trim any bushes or trees on the path to your home.  Use bright outdoor lighting.  Clear any walking paths of anything that might make someone trip, such as rocks or tools.  Regularly check to see if handrails are loose or broken. Make sure that both sides of any steps have handrails.  Any raised decks and porches should have guardrails on the edges.  Have any leaves, snow, or ice cleared regularly.  Use sand or salt on walking paths during winter.  Clean up any spills in your garage right away. This includes oil or grease spills. What can I do in the bathroom?  Use night lights.  Install grab bars by the toilet and in the tub and shower. Do not use towel bars as grab bars.  Use non-skid mats or decals in the tub or shower.  If you need to sit down in the shower, use a plastic, non-slip  stool.  Keep the floor dry. Clean up any water that spills on the floor as soon as it happens.  Remove soap buildup in the tub or shower regularly.  Attach bath mats securely with double-sided non-slip rug tape.  Do not have throw rugs and other things on the floor that can make you trip. What can I do in the bedroom?  Use night lights.  Make sure that you have a light by your bed that is easy to reach.  Do not use any sheets or blankets that are too big for your bed. They should not hang down onto the floor.  Have a firm chair that has side arms. You can use this for support while you get dressed.  Do not have throw rugs and other things on the floor that can make you trip. What can I do in the kitchen?  Clean up any spills right away.  Avoid walking on wet floors.  Keep items that you use a lot in easy-to-reach places.  If you need to reach something above you, use a strong step stool that has a grab bar.  Keep electrical cords out of the way.  Do not use  floor polish or wax that makes floors slippery. If you must use wax, use non-skid floor wax.  Do not have throw rugs and other things on the floor that can make you trip. What can I do with my stairs?  Do not leave any items on the stairs.  Make sure that there are handrails on both sides of the stairs and use them. Fix handrails that are broken or loose. Make sure that handrails are as long as the stairways.  Check any carpeting to make sure that it is firmly attached to the stairs. Fix any carpet that is loose or worn.  Avoid having throw rugs at the top or bottom of the stairs. If you do have throw rugs, attach them to the floor with carpet tape.  Make sure that you have a light switch at the top of the stairs and the bottom of the stairs. If you do not have them, ask someone to add them for you. What else can I do to help prevent falls?  Wear shoes that:  Do not have high heels.  Have rubber bottoms.  Are  comfortable and fit you well.  Are closed at the toe. Do not wear sandals.  If you use a stepladder:  Make sure that it is fully opened. Do not climb a closed stepladder.  Make sure that both sides of the stepladder are locked into place.  Ask someone to hold it for you, if possible.  Clearly mark and make sure that you can see:  Any grab bars or handrails.  First and last steps.  Where the edge of each step is.  Use tools that help you move around (mobility aids) if they are needed. These include:  Canes.  Walkers.  Scooters.  Crutches.  Turn on the lights when you go into a dark area. Replace any light bulbs as soon as they burn out.  Set up your furniture so you have a clear path. Avoid moving your furniture around.  If any of your floors are uneven, fix them.  If there are any pets around you, be aware of where they are.  Review your medicines with your doctor. Some medicines can make you feel dizzy. This can increase your chance of falling. Ask your doctor what other things that you can do to help prevent falls. This information is not intended to replace advice given to you by your health care provider. Make sure you discuss any questions you have with your health care provider. Document Released: 01/18/2009 Document Revised: 08/30/2015 Document Reviewed: 04/28/2014 Elsevier Interactive Patient Education  2017 [employer].

## 2020-03-24 ENCOUNTER — Inpatient Hospital Stay (HOSPITAL_COMMUNITY)
Admission: EM | Admit: 2020-03-24 | Discharge: 2020-04-03 | DRG: 190 | Disposition: A | Discharge status: Home / Self Care | Payer: Medicare Other | Attending: Internal Medicine | Admitting: Internal Medicine

## 2020-03-24 ENCOUNTER — Emergency Department (HOSPITAL_COMMUNITY): Payer: Medicare Other

## 2020-03-24 ENCOUNTER — Other Ambulatory Visit: Payer: Self-pay

## 2020-03-24 DIAGNOSIS — I5042 Chronic combined systolic (congestive) and diastolic (congestive) heart failure: Secondary | ICD-10-CM

## 2020-03-24 DIAGNOSIS — Z9079 Acquired absence of other genital organ(s): Secondary | ICD-10-CM

## 2020-03-24 DIAGNOSIS — M1990 Unspecified osteoarthritis, unspecified site: Secondary | ICD-10-CM | POA: Diagnosis present

## 2020-03-24 DIAGNOSIS — Z20822 Contact with and (suspected) exposure to covid-19: Secondary | ICD-10-CM | POA: Diagnosis present

## 2020-03-24 DIAGNOSIS — J441 Chronic obstructive pulmonary disease with (acute) exacerbation: Principal | ICD-10-CM | POA: Diagnosis present

## 2020-03-24 DIAGNOSIS — J9621 Acute and chronic respiratory failure with hypoxia: Secondary | ICD-10-CM | POA: Diagnosis present

## 2020-03-24 DIAGNOSIS — E8801 Alpha-1-antitrypsin deficiency: Secondary | ICD-10-CM | POA: Diagnosis present

## 2020-03-24 DIAGNOSIS — F32A Depression, unspecified: Secondary | ICD-10-CM | POA: Diagnosis present

## 2020-03-24 DIAGNOSIS — Z7952 Long term (current) use of systemic steroids: Secondary | ICD-10-CM

## 2020-03-24 DIAGNOSIS — Z79899 Other long term (current) drug therapy: Secondary | ICD-10-CM

## 2020-03-24 DIAGNOSIS — B370 Candidal stomatitis: Secondary | ICD-10-CM | POA: Diagnosis present

## 2020-03-24 DIAGNOSIS — J309 Allergic rhinitis, unspecified: Secondary | ICD-10-CM | POA: Diagnosis present

## 2020-03-24 DIAGNOSIS — I110 Hypertensive heart disease with heart failure: Secondary | ICD-10-CM | POA: Diagnosis present

## 2020-03-24 DIAGNOSIS — Z66 Do not resuscitate: Secondary | ICD-10-CM | POA: Diagnosis present

## 2020-03-24 DIAGNOSIS — I340 Nonrheumatic mitral (valve) insufficiency: Secondary | ICD-10-CM | POA: Diagnosis present

## 2020-03-24 DIAGNOSIS — R911 Solitary pulmonary nodule: Secondary | ICD-10-CM | POA: Diagnosis present

## 2020-03-24 DIAGNOSIS — Z87891 Personal history of nicotine dependence: Secondary | ICD-10-CM

## 2020-03-24 DIAGNOSIS — I5023 Acute on chronic systolic (congestive) heart failure: Secondary | ICD-10-CM

## 2020-03-24 DIAGNOSIS — Z9981 Dependence on supplemental oxygen: Secondary | ICD-10-CM

## 2020-03-24 DIAGNOSIS — Z9049 Acquired absence of other specified parts of digestive tract: Secondary | ICD-10-CM | POA: Diagnosis not present

## 2020-03-24 DIAGNOSIS — Z8249 Family history of ischemic heart disease and other diseases of the circulatory system: Secondary | ICD-10-CM | POA: Diagnosis not present

## 2020-03-24 DIAGNOSIS — F419 Anxiety disorder, unspecified: Secondary | ICD-10-CM | POA: Diagnosis present

## 2020-03-24 DIAGNOSIS — Z880 Allergy status to penicillin: Secondary | ICD-10-CM

## 2020-03-24 DIAGNOSIS — E785 Hyperlipidemia, unspecified: Secondary | ICD-10-CM | POA: Diagnosis present

## 2020-03-24 DIAGNOSIS — Z90710 Acquired absence of both cervix and uterus: Secondary | ICD-10-CM

## 2020-03-24 DIAGNOSIS — E872 Acidosis: Secondary | ICD-10-CM | POA: Diagnosis present

## 2020-03-24 DIAGNOSIS — Z7951 Long term (current) use of inhaled steroids: Secondary | ICD-10-CM

## 2020-03-24 DIAGNOSIS — Z888 Allergy status to other drugs, medicaments and biological substances status: Secondary | ICD-10-CM

## 2020-03-24 DIAGNOSIS — I5022 Chronic systolic (congestive) heart failure: Secondary | ICD-10-CM

## 2020-03-24 DIAGNOSIS — M8088XA Other osteoporosis with current pathological fracture, vertebra(e), initial encounter for fracture: Secondary | ICD-10-CM | POA: Diagnosis present

## 2020-03-24 DIAGNOSIS — Z7982 Long term (current) use of aspirin: Secondary | ICD-10-CM

## 2020-03-24 DIAGNOSIS — Z90722 Acquired absence of ovaries, bilateral: Secondary | ICD-10-CM | POA: Diagnosis not present

## 2020-03-24 DIAGNOSIS — I10 Essential (primary) hypertension: Secondary | ICD-10-CM | POA: Diagnosis not present

## 2020-03-24 DIAGNOSIS — I5032 Chronic diastolic (congestive) heart failure: Secondary | ICD-10-CM | POA: Diagnosis present

## 2020-03-24 DIAGNOSIS — Z881 Allergy status to other antibiotic agents status: Secondary | ICD-10-CM

## 2020-03-24 LAB — CBC WITH DIFFERENTIAL/PLATELET
Abs Immature Granulocytes: 0.06 10*3/uL (ref 0.00–0.07)
Basophils Absolute: 0.1 10*3/uL (ref 0.0–0.1)
Basophils Relative: 1 %
Eosinophils Absolute: 0.1 10*3/uL (ref 0.0–0.5)
Eosinophils Relative: 1 %
HCT: 40.6 % (ref 36.0–46.0)
Hemoglobin: 13.1 g/dL (ref 12.0–15.0)
Immature Granulocytes: 0 %
Lymphocytes Relative: 28 %
Lymphs Abs: 4.6 10*3/uL — ABNORMAL HIGH (ref 0.7–4.0)
MCH: 30.7 pg (ref 26.0–34.0)
MCHC: 32.3 g/dL (ref 30.0–36.0)
MCV: 95.1 fL (ref 80.0–100.0)
Monocytes Absolute: 1.1 10*3/uL — ABNORMAL HIGH (ref 0.1–1.0)
Monocytes Relative: 7 %
Neutro Abs: 10.8 10*3/uL — ABNORMAL HIGH (ref 1.7–7.7)
Neutrophils Relative %: 63 %
Platelets: 297 10*3/uL (ref 150–400)
RBC: 4.27 MIL/uL (ref 3.87–5.11)
RDW: 14.3 % (ref 11.5–15.5)
WBC: 16.8 10*3/uL — ABNORMAL HIGH (ref 4.0–10.5)
nRBC: 0 % (ref 0.0–0.2)

## 2020-03-24 LAB — BASIC METABOLIC PANEL
Anion gap: 11 (ref 5–15)
BUN: 21 mg/dL (ref 8–23)
CO2: 26 mmol/L (ref 22–32)
Calcium: 9.3 mg/dL (ref 8.9–10.3)
Chloride: 105 mmol/L (ref 98–111)
Creatinine, Ser: 0.74 mg/dL (ref 0.44–1.00)
GFR, Estimated: >60 mL/min (ref >60)
Glucose, Bld: 106 mg/dL — ABNORMAL HIGH (ref 70–99)
Potassium: 3.6 mmol/L (ref 3.5–5.1)
Sodium: 142 mmol/L (ref 135–145)

## 2020-03-24 LAB — RESP PANEL BY RT-PCR (FLU A&B, COVID) ARPGX2
Influenza A by PCR: NEGATIVE
Influenza B by PCR: NEGATIVE
SARS Coronavirus 2 by RT PCR: NEGATIVE

## 2020-03-24 MED ORDER — ASPIRIN 81 MG PO TBEC: 81.0000 mg | DELAYED_RELEASE_TABLET | Freq: Every day | ORAL | Status: DC

## 2020-03-24 MED ORDER — ALBUTEROL SULFATE (2.5 MG/3ML) 0.083% IN NEBU
2.5000 mg | INHALATION_SOLUTION | RESPIRATORY_TRACT | Status: DC | PRN
  Administered 2020-03-24: 17:00:00 2.5 mg via RESPIRATORY_TRACT
  Filled 2020-03-24: qty 3

## 2020-03-24 MED ORDER — ALPRAZOLAM 0.5 MG PO TABS
0.5000 mg | ORAL_TABLET | Freq: Two times a day (BID) | ORAL | Status: DC | PRN
  Administered 2020-03-24 – 2020-04-03 (×17): 0.5 mg via ORAL
  Filled 2020-03-24 (×18): qty 1

## 2020-03-24 MED ORDER — ACETAMINOPHEN 650 MG RE SUPP: 650.0000 mg | Freq: Four times a day (QID) | RECTAL | Status: DC | PRN

## 2020-03-24 MED ORDER — HYDRALAZINE HCL 20 MG/ML IJ SOLN: 10.0000 mg | Freq: Three times a day (TID) | INTRAMUSCULAR | Status: DC | PRN

## 2020-03-24 MED ORDER — METHYLPREDNISOLONE SODIUM SUCC 125 MG IJ SOLR
60.0000 mg | Freq: Four times a day (QID) | INTRAMUSCULAR | Status: DC
  Administered 2020-03-24 – 2020-03-25 (×3): 60 mg via INTRAVENOUS
  Filled 2020-03-24 (×3): qty 2

## 2020-03-24 MED ORDER — FUROSEMIDE 40 MG PO TABS
20.0000 mg | ORAL_TABLET | Freq: Every day | ORAL | Status: DC | PRN
  Administered 2020-03-25 – 2020-03-27 (×2): 40 mg via ORAL
  Filled 2020-03-24 (×2): qty 1

## 2020-03-24 MED ORDER — MOMETASONE FURO-FORMOTEROL FUM 200-5 MCG/ACT IN AERO
2.0000 | INHALATION_SPRAY | Freq: Two times a day (BID) | RESPIRATORY_TRACT | Status: DC
  Administered 2020-03-25: 09:00:00 2 via RESPIRATORY_TRACT
  Filled 2020-03-24: qty 8.8

## 2020-03-24 MED ORDER — ACETAMINOPHEN 325 MG PO TABS
650.0000 mg | ORAL_TABLET | Freq: Four times a day (QID) | ORAL | Status: DC | PRN
  Administered 2020-03-24: 650 mg via ORAL
  Filled 2020-03-24 (×2): qty 2

## 2020-03-24 MED ORDER — POLYETHYLENE GLYCOL 3350 17 G PO PACK: 17.0000 g | PACK | Freq: Every day | ORAL | Status: DC | PRN

## 2020-03-24 MED ORDER — IOHEXOL 350 MG/ML SOLN
100.0000 mL | Freq: Once | INTRAVENOUS | Status: AC | PRN
  Administered 2020-03-24: 15:00:00 100 mL via INTRAVENOUS

## 2020-03-24 MED ORDER — SODIUM CHLORIDE 0.9 % IV SOLN
INTRAVENOUS | Status: DC | PRN
  Administered 2020-03-24: 10:00:00 500 mL via INTRAVENOUS

## 2020-03-24 MED ORDER — ASPIRIN EC 81 MG PO TBEC
81.0000 mg | DELAYED_RELEASE_TABLET | Freq: Every day | ORAL | Status: DC
  Administered 2020-03-25 – 2020-04-03 (×10): 81 mg via ORAL
  Filled 2020-03-24 (×10): qty 1

## 2020-03-24 MED ORDER — PREDNISONE 20 MG PO TABS: 40.0000 mg | ORAL_TABLET | Freq: Every day | ORAL | Status: DC

## 2020-03-24 MED ORDER — MAGNESIUM SULFATE 2 GM/50ML IV SOLN
2.0000 g | Freq: Once | INTRAVENOUS | Status: AC
  Administered 2020-03-24: 10:00:00 2 g via INTRAVENOUS
  Filled 2020-03-24: qty 50

## 2020-03-24 MED ORDER — HYDROCODONE-ACETAMINOPHEN 7.5-325 MG PO TABS
1.0000 | ORAL_TABLET | Freq: Two times a day (BID) | ORAL | Status: DC | PRN
Start: 2020-03-24 — End: 2020-04-03
  Administered 2020-03-25 – 2020-04-02 (×11): 1 via ORAL
  Filled 2020-03-24 (×12): qty 1

## 2020-03-24 MED ORDER — ENOXAPARIN SODIUM 40 MG/0.4ML ~~LOC~~ SOLN
40.0000 mg | SUBCUTANEOUS | Status: DC
  Administered 2020-03-24: 20:00:00 40 mg via SUBCUTANEOUS
  Filled 2020-03-24 (×10): qty 0.4

## 2020-03-24 MED ORDER — PAROXETINE HCL 20 MG PO TABS
30.0000 mg | ORAL_TABLET | Freq: Every day | ORAL | Status: DC
  Administered 2020-03-25 – 2020-04-03 (×10): 30 mg via ORAL
  Filled 2020-03-24 (×11): qty 1

## 2020-03-24 MED ORDER — IPRATROPIUM-ALBUTEROL 0.5-2.5 (3) MG/3ML IN SOLN
3.0000 mL | Freq: Once | RESPIRATORY_TRACT | Status: AC
  Administered 2020-03-24: 13:00:00 3 mL via RESPIRATORY_TRACT
  Filled 2020-03-24: qty 3

## 2020-03-24 MED ORDER — ALPRAZOLAM 0.5 MG PO TABS: 0.5000 mg | ORAL_TABLET | Freq: Two times a day (BID) | ORAL | Status: DC | PRN

## 2020-03-24 MED ORDER — IPRATROPIUM-ALBUTEROL 0.5-2.5 (3) MG/3ML IN SOLN
3.0000 mL | Freq: Four times a day (QID) | RESPIRATORY_TRACT | Status: DC
  Administered 2020-03-24 – 2020-03-30 (×23): 3 mL via RESPIRATORY_TRACT
  Filled 2020-03-24 (×25): qty 3

## 2020-03-24 NOTE — H&P (Signed)
History and Physical        Hospital Admission Note Date: 03/24/2020  Patient name: Kim [name] Medical record number: [id number] Date of birth: [birth year] Age: 64 y.o. Gender: female  PCP: [name], NP   Chief Complaint    Chief Complaint  Patient presents with   Shortness of Breath      HPI:   This is a 64 year old female with a past medical history of COPD with chronic respiratory failure on chronic steroids, hyperlipidemia, alpha-1 antitrypsin deficiency carrier, anxiety and depression and recently received her pneumonia vaccine 3 days ago and is vaccinated against COVID-19 who presents to the ED via EMS with complaints of sudden onset shortness of breath x1.  This is unlike prior COPD exacerbations because of the sudden onset but otherwise symptoms are similar. she has tried nebulizer treatments at home without any improvement.  She is on chronic steroids and has been weaning down per her pulmonologist and is currently on prednisone 5 mg daily.  Uses home O2 as needed and usually only when ambulating long distances at 1 LPM.  Lately has been using 3-4 LPM since symptom onset.  States that she recently went to [place] last week which was 5 hours there and 5 hours back.  Upon EMS arrival, patient was noted to be wheezing in all lung fields with SPO2 89% on room air and placed on 2 L nasal cannula.  She was given albuterol, Atrovent, Solu-Medrol with some improvement however not back to baseline.  Denies any significant chest pain other than when she is coughing or taking a deep breath.  Admits to a headache as well.  No other complaints.   ED Course: Afebrile, hemodynamically stable, placed on 1-2 L/min. Notable Labs: Sodium 142, K3.6, BUN 21, creatinine 0.74, WBC 16.8 (previously 8.5 in June), Hb 13.1, COVID-19 and flu negative. Notable Imaging: CXR with no acute  cardiopulmonary disease and findings consistent with COPD. Patient received duo nebs and magnesium sulfate without significant improvement.    Vitals:   03/24/20 1230 03/24/20 1330  BP: (!) 105/54 (!) 108/54  Pulse: 91 87  Resp: (!) 21 19  Temp:    SpO2: 93% 96%     Review of Systems:  Review of Systems  All other systems reviewed and are negative.   Medical/Social/Family History   Past Medical History: 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   OSTEOARTHRITIS 06/05/2009   PARESTHESIA 08/24/2007   TOBACCO ABUSE 01/25/2010    Past Surgical History:  Procedure Laterality Date   ABDOMINAL HYSTERECTOMY     APPENDECTOMY  1969   CARDIAC CATHETERIZATION N/A 07/05/2015   Procedure: Left Heart Cath and Coronary Angiography;  Surgeon: Leonie Man, MD;  Location: [facility];  Service: Cardiovascular;  Laterality: N/A;   CHOLECYSTECTOMY  1989   collapse lung  1990   COLONOSCOPY WITH PROPOFOL N/A 05/14/2018   Procedure: COLONOSCOPY WITH PROPOFOL;  Surgeon: Irene Shipper, MD;  Location: [facility];  Service: Endoscopy;  Laterality: N/A;   EXCISION MASS ABDOMINAL  2019  EXPLORATORY LAPAROTOMY  1969   POLYPECTOMY  05/14/2018   Procedure: POLYPECTOMY;  Surgeon: Irene Shipper, MD;  Location: [facility];  Service: Endoscopy;;   SALPINGOOPHORECTOMY  2019    Medications: Prior to Admission medications   Medication Sig Start Date End Date Taking? Authorizing Provider  albuterol (VENTOLIN HFA) 108 (90 Base) MCG/ACT inhaler Take two puffs every 4-6 hours as needed for shortness of breath/wheezing . 90 day supply 12/14/19   Martyn Ehrich, NP  ALPRAZolam Duanne Moron) 0.5 MG tablet TAKE 1/2 TO 1 TABLET BY MOUTH THREE TIMES DAILY 11/25/19   Nafziger, Tommi Rumps, NP  Ascorbic Acid (VITAMIN C PO) Take 1 tablet by mouth daily.     [provider]  aspirin EC 81 MG EC tablet Take 1 tablet (81 mg total) by mouth daily. 07/08/15   Nita Sells, MD  budesonide-formoterol (SYMBICORT) 160-4.5 MCG/ACT inhaler Inhale 2 puffs into the lungs 2 (two) times daily. 09/30/19   Martyn Ehrich, NP  Calcium Carbonate-Vit D-Min (CALCIUM 1200 PO) Take by mouth.    [provider]  Cholecalciferol (VITAMIN D3) 50 MCG (2000 UT) TABS Take 2,000 Units by mouth daily.    [provider]  cyanocobalamin 2000 MCG tablet Take 2,000 mcg by mouth daily. Vitamin B12    [provider]  furosemide (LASIX) 40 MG tablet Take 1 tablet (40 mg total) by mouth daily as needed (for weight gain of 3 lbs). Patient taking differently: Take 20-40 mg by mouth daily as needed (for overnight weight gain of 3 lbs 1/2 tablet (20 mg), for overnight weight gain of 5 lbs 1 tablet (40 mg)). 07/01/19   Tommie Raymond, NP  Ginkgo Biloba Extract 60 MG CAPS Take 60 mg by mouth daily.    [provider]  HYDROcodone-acetaminophen (NORCO) 7.5-325 MG tablet Take 1 tablet by mouth 2 (two) times daily as needed (pain/cough). 02/23/20   Nafziger, Tommi Rumps, NP  HYDROcodone-acetaminophen (NORCO) 7.5-325 MG tablet Take 1 tablet by mouth every 6 (six) hours as needed for moderate pain. Patient not taking: Reported on 03/22/2020 02/23/20   [name], NP  HYDROcodone-acetaminophen (NORCO) 7.5-325 MG tablet Take 1 tablet by mouth every 6 (six) hours as needed for moderate pain. Patient not taking: Reported on 03/22/2020 02/23/20   [name], NP  ipratropium-albuterol (DUONEB) 0.5-2.5 (3) MG/3ML SOLN USE 3 ML VIA NEBULIZER EVERY 6 HOURS AS NEEDED 02/14/20   [name], MD  losartan (COZAAR) 25 MG tablet TAKE 1 TABLET EVERY DAY 01/11/20   [name], MD  magnesium oxide (MAG-OX) 400 MG tablet Take 400 mg by mouth daily.    [provider]  methocarbamol (ROBAXIN) 500 MG tablet Robaxin 500 mg tablet  Take 1  tablet twice a day by oral route as needed for 10 days. Patient not taking: Reported on 03/22/2020    [provider]  nitroGLYCERIN (NITROSTAT) 0.4 MG SL tablet Place 1 tablet (0.4 mg total) under the tongue every 5 (five) minutes as needed for chest pain. Patient not taking: Reported on 03/22/2020 08/09/18   Lyda Jester M, PA-C  OXYGEN Inhale 2 L into the lungs continuous.    [provider]  PARoxetine (PAXIL) 30 MG tablet Take 1 tablet (30 mg total) by mouth daily. 01/11/20   Nafziger, Tommi Rumps, NP  predniSONE (DELTASONE) 5 MG tablet Take 1 tablet (5 mg total) by mouth daily with breakfast. December 5 mg daily.  January 1st decrease to 5 mg every other day. 03/05/20  [name], MD  Spacer/Aero Chamber Mouthpiece MISC 1 Device by Does not apply route as directed. 01/12/17   Javier Glazier, MD  Tiotropium Bromide Monohydrate (SPIRIVA RESPIMAT) 2.5 MCG/ACT AERS USE 2 INHALATIONS BY MOUTH  DAILY Patient taking differently: Inhale 2 puffs into the lungs daily at 12 noon. 03/29/19   [name], NP    Allergies:   Allergies  Allergen Reactions   Ciprofloxacin Other (See Comments)    Hallucinations    Lisinopril Cough   Penicillins Hives    Has patient had a PCN reaction causing immediate rash, facial/tongue/throat swelling, SOB or lightheadedness with hypotension: Yes Has patient had a PCN reaction causing severe rash involving mucus membranes or skin necrosis: No Has patient had a PCN reaction that required hospitalization No Has patient had a PCN reaction occurring within the last 10 years: No If all of the above answers are "NO", then may proceed with Cephalosporin use.     Social History:  reports that she quit smoking about 6 years ago. Her smoking use included cigarettes. She has a 76.00 pack-year smoking history. She has never used smokeless tobacco. She reports that she does not drink alcohol and does not use drugs.  Family History: Family  History  Problem Relation Age of Onset   Cancer Mother        lung   Heart attack Mother    Hypertension Sister    Multiple sclerosis Brother    Diabetes Sister    Rheum arthritis Sister    Thyroid disease Sister    Multiple sclerosis Other    Alcohol abuse Other    Arthritis Other    Diabetes Other    Kidney disease Other    Cancer Other        lung,ovarian,skin, uterine   Stroke Other    Heart disease Other    Melanoma Other    Heart attack Maternal Uncle    Heart attack Other        NEICE   Heart attack Other        NEPHEW   Hypertension Brother    Stroke Maternal Grandmother    Colon cancer Neg Hx      Objective   Physical Exam: Blood pressure (!) 108/54, pulse 87, temperature 98.6 F (37 C), temperature source Oral, resp. rate 19, height 5\' 3"  (1.6 m), weight 59 kg, SpO2 96 %.  Physical Exam Vitals and nursing note reviewed.  Constitutional:      Appearance: Normal appearance.  HENT:     Head: Normocephalic and atraumatic.  Eyes:     Conjunctiva/sclera: Conjunctivae normal.  Cardiovascular:     Rate and Rhythm: Normal rate and regular rhythm.  Pulmonary:     Breath sounds: Wheezing present.     Comments: Conversational dyspnea Abdominal:     General: Abdomen is flat.     Palpations: Abdomen is soft.  Musculoskeletal:        General: No swelling or tenderness.     Right lower leg: No tenderness. No edema.     Left lower leg: No tenderness. No edema.  Skin:    Coloration: Skin is not jaundiced or pale.  Neurological:     Mental Status: She is alert. Mental status is at baseline.  Psychiatric:        Mood and Affect: Mood normal.        Behavior: Behavior normal.     LABS on Admission: I have personally reviewed all the labs and  imaging below    Basic Metabolic Panel: Recent Labs  Lab 03/24/20 0939  NA 142  K 3.6  CL 105  CO2 26  GLUCOSE 106*  BUN 21  CREATININE 0.74  CALCIUM 9.3   Liver Function Tests: No  results for input(s): AST, ALT, ALKPHOS, BILITOT, PROT, ALBUMIN in the last 168 hours. No results for input(s): LIPASE, AMYLASE in the last 168 hours. No results for input(s): AMMONIA in the last 168 hours. CBC: Recent Labs  Lab 03/24/20 0939  WBC 16.8*  NEUTROABS 10.8*  HGB 13.1  HCT 40.6  MCV 95.1  PLT 297   Cardiac Enzymes: No results for input(s): CKTOTAL, CKMB, CKMBINDEX, TROPONINI in the last 168 hours. BNP: Invalid input(s): POCBNP CBG: No results for input(s): GLUCAP in the last 168 hours.  Radiological Exams on Admission:  CT ANGIO CHEST PE W OR WO CONTRAST  Result Date: 03/24/2020 CLINICAL DATA:  Shortness of breath.  History of COPD. EXAM: CT ANGIOGRAPHY CHEST WITH CONTRAST TECHNIQUE: Multidetector CT imaging of the chest was performed using the standard protocol during bolus administration of intravenous contrast. Multiplanar CT image reconstructions and MIPs were obtained to evaluate the vascular anatomy. CONTRAST:  142mL OMNIPAQUE IOHEXOL 350 MG/ML SOLN COMPARISON:  January 31, 2020 FINDINGS: Cardiovascular: Satisfactory opacification of the pulmonary arteries to the segmental level. No evidence of pulmonary embolism. Normal heart size. No pericardial effusion. LEFT-sided coronary artery atherosclerotic calcifications. Scattered atherosclerotic calcifications throughout the aorta. Mediastinum/Nodes: No enlarged mediastinal, hilar, or axillary lymph nodes. Thyroid gland, trachea, and esophagus demonstrate no significant findings. Lungs/Pleura: No pleural effusion or pneumothorax. Severe centrilobular emphysema. There is mild superimposed diffuse ground-glass opacity with an apical predominance, similar in comparison to prior. Mild paraseptal emphysema. Biapical scarring, unchanged. Scattered areas of endobronchial debris. Mild bronchial wall thickening. Scattered tiny pulmonary nodules mild representative RIGHT lower lobe 2 mm pulmonary nodule, unchanged (series 5, image 139).  Lingular pulmonary nodule measures 4 x 2 mm, unchanged (series 5, image 201). Upper Abdomen: Unchanged 12 mm RIGHT adrenal nodule since at least 2017, most consistent with a benign etiology. Musculoskeletal: Chronic moderate compression fracture deformity of T7, unchanged. New mild compression fracture deformity of L1 since October of 2021. Review of the MIP images confirms the above findings. IMPRESSION: 1. No evidence of pulmonary embolism. 2. Severe centrilobular emphysema with mild superimposed diffuse ground-glass opacity with an apical predominance, similar in comparison to prior study. Findings likely reflect smoking related lung disease/respiratory bronchiolitis. 3. New mild compression fracture deformity of L1 since October of 2021. 4. Unchanged scattered pulmonary nodules. Recommend continued annual lung cancer screening. Aortic Atherosclerosis (ICD10-I70.0) and Emphysema (ICD10-J43.9). Electronically Signed   By: Valentino Saxon MD   On: 03/24/2020 15:14   DG Chest Portable 1 View  Result Date: 03/24/2020 CLINICAL DATA:  Dyspnea, COPD EXAM: PORTABLE CHEST 1 VIEW COMPARISON:  09/17/2019 chest radiograph. FINDINGS: Stable cardiomediastinal silhouette with normal heart size. No pneumothorax. No pleural effusion. Hyperinflated lungs. No pulmonary edema. No acute consolidative airspace disease. IMPRESSION: 1. No acute cardiopulmonary disease. 2. Hyperinflated lungs, compatible with reported COPD. Electronically Signed   By: Ilona Sorrel M.D.   On: 03/24/2020 10:26       EKG: unchanged from previous tracings   A & P   Principal Problem:   COPD exacerbation (HCC) Active Problems:   Anxiety and depression   Chronic diastolic CHF (congestive heart failure) (HCC)   Essential hypertension   1. COPD exacerbation a. Sudden onset is concerning in the setting of  recent road trip with recurrent exacerbations b. CTA chest to rule out PE c. Continue steroids, duo nebs, Dulera and as needed  albuterol d. Incentive spirometry and flutter valve e. Candidate for long-term azithromycin? f. Outpatient follow-up with pulmonology, patient of Dr. Elsworth Soho  2. Hypertension a. Currently soft BPs b. Hold antihypertensives for now  3. HFpEF, not in acute exacerbation a. Continue home Lasix as needed  4. Anxiety/depression a. Continue Paxil  5. Leukocytosis a. Likely steroid induced as she received solumedrol prior to arrival    DVT prophylaxis: Lovenox   Code Status: DNR  Diet: Heart healthy/carb modified Family Communication: Admission, patients condition and plan of care including tests being ordered have been discussed with the patient who indicates understanding and agrees with the plan and Code Status.  Disposition Plan: The appropriate patient status for this patient is INPATIENT. Inpatient status is judged to be reasonable and necessary in order to provide the required intensity of service to ensure the patient's safety. The patient's presenting symptoms, physical exam findings, and initial radiographic and laboratory data in the context of their chronic comorbidities is felt to place them at high risk for further clinical deterioration. Furthermore, it is not anticipated that the patient will be medically stable for discharge from the hospital within 2 midnights of admission. The following factors support the patient status of inpatient.   " The patient's presenting symptoms include shortness of breath " The worrisome physical exam findings include conversational dyspnea. " The initial radiographic and laboratory data are worrisome because of leukocytosis. " The chronic co-morbidities include COPD.   * I certify that at the point of admission it is my clinical judgment that the patient will require inpatient hospital care spanning beyond 2 midnights from the point of admission due to high intensity of service, high risk for further deterioration and high frequency of surveillance  required.*     Consultants   None  Procedures   None  Time Spent on Admission: 64 minutes    Harold Hedge, DO [facility]  03/24/2020, 4:37 PM

## 2020-03-24 NOTE — ED Notes (Signed)
X-ray at bedside

## 2020-03-24 NOTE — ED Provider Notes (Signed)
[facility] DEPT Provider Note   CSN: [id number] Arrival date & time: 03/24/20  3710     History Chief Complaint  Patient presents with  . Shortness of Breath    Kim [name] is a 64 y.o. female with past medical history significant for COPD, mitral incompetence, osteoarthritis, C8 radiculopathy.  Had COVID and influenza examinations.  Received pneumonia vaccine x3 days ago.  HPI Patient presents to emergency room today via EMS with chief complaint of shortness of breath and productive cough x2 days. Cough with yellow phlegm and now endorsing congestion. She tried two nebulizer treatments without symptom improvement. She felt like she could not breath and called EMS. She has home prn O2 and usually only wears it when ambulating far distances at 1L. She was wearing 3-4L since symptom onset.  EMS noted she had wheezing heard in all lung fields.  She was 89% on room air on their arrival, placed on 2 L nasal cannula.  EMS gave 10mg  of albuterol, 0.5mg  Atrovent and 125 mg of Solu-Medrol. Patient reports feeling improved after intervention, however not back to baseline. She denies any sick contacts. Denies fever, chills, hemoptysis, shortness of breath, chest pain.    Past Medical History:  Diagnosis Date  . C8 RADICULOPATHY 06/05/2009  . COPD 08/31/2007  . DEPRESSION 11/19/2006  . DYSLIPIDEMIA 03/12/2009  . MI (mitral incompetence)   . 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 OSTEOARTHRITIS 06/05/2009  . PARESTHESIA 08/24/2007  . TOBACCO ABUSE 01/25/2010    Patient Active Problem List   Diagnosis Date Noted  . Vertebral fracture, osteoporotic ([place]) 03/05/2020  . COPD with acute exacerbation ([place]) 07/10/2019  . History of colonic polyps   . Benign neoplasm of ascending colon   . Neuritis 01/29/2017  . Alpha-1-antitrypsin deficiency carrier 01/19/2017  . Dependent edema 01/14/2017  . Allergic rhinitis 08/10/2015  . Chronic respiratory failure ([name]) 07/05/2015  .  Hyperglycemia   . HLD (hyperlipidemia)   . Coronary artery calcification seen on CAT scan 07/02/2015  . Solitary pulmonary nodule 12/02/2013  . Loss of weight 11/10/2013  . Osteoarthritis 06/05/2009  . Brachial neuritis or radiculitis 06/05/2009  . Dyslipidemia 03/12/2009  . 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 COPD GOLD III  08/31/2007  . PARESTHESIA 08/24/2007  . Anxiety and depression 11/19/2006    Past Surgical History:  Procedure Laterality Date  . ABDOMINAL HYSTERECTOMY    . APPENDECTOMY  1969  . CARDIAC CATHETERIZATION N/A 07/05/2015   Procedure: Left Heart Cath and Coronary Angiography;  Surgeon: Leonie Man, MD;  Location: [facility];  Service: Cardiovascular;  Laterality: N/A;  . CHOLECYSTECTOMY  1989  . collapse lung  1990  . COLONOSCOPY WITH PROPOFOL N/A 05/14/2018   Procedure: COLONOSCOPY WITH PROPOFOL;  Surgeon: Irene Shipper, MD;  Location: [facility];  Service: Endoscopy;  Laterality: N/A;  . EXCISION MASS ABDOMINAL  2019  . EXPLORATORY LAPAROTOMY  1969  . POLYPECTOMY  05/14/2018   Procedure: POLYPECTOMY;  Surgeon: Irene Shipper, MD;  Location: [facility];  Service: Endoscopy;;  . SALPINGOOPHORECTOMY  2019     OB History   No obstetric history on file.     Family History  Problem Relation Age of Onset  . Cancer Mother        lung  . Heart attack Mother   . Hypertension Sister   . Multiple sclerosis Brother   . Diabetes Sister   . Rheum arthritis Sister   . Thyroid disease Sister   . Multiple  sclerosis Other   . Alcohol abuse Other   . Arthritis Other   . Diabetes Other   . Kidney disease Other   . Cancer Other        lung,ovarian,skin, uterine  . Stroke Other   . Heart disease Other   . Melanoma Other   . Heart attack Maternal Uncle   . Heart attack Other        NEICE  . Heart attack Other        NEPHEW  . Hypertension Brother   . Stroke Maternal Grandmother   . Colon cancer Neg Hx     Social History   Tobacco Use  . Smoking  status: Former Smoker    Packs/day: 2.00    Years: 38.00    Pack years: 76.00    Types: Cigarettes    Quit date: 03/07/2014    Years since quitting: 6.0  . Smokeless tobacco: Never Used  Vaping Use  . Vaping Use: Never used  Substance Use Topics  . Alcohol use: No    Alcohol/week: 0.0 standard drinks  . Drug use: No    Home Medications Prior to Admission medications   Medication Sig Start Date End Date Taking? Authorizing Provider  albuterol (VENTOLIN HFA) 108 (90 Base) MCG/ACT inhaler Take two puffs every 4-6 hours as needed for shortness of breath/wheezing . 90 day supply 12/14/19   Martyn Ehrich, NP  ALPRAZolam Duanne Moron) 0.5 MG tablet TAKE 1/2 TO 1 TABLET BY MOUTH THREE TIMES DAILY 11/25/19   Nafziger, Tommi Rumps, NP  Ascorbic Acid (VITAMIN C PO) Take 1 tablet by mouth daily.    [provider]  aspirin EC 81 MG EC tablet Take 1 tablet (81 mg total) by mouth daily. 07/08/15   Nita Sells, MD  budesonide-formoterol (SYMBICORT) 160-4.5 MCG/ACT inhaler Inhale 2 puffs into the lungs 2 (two) times daily. 09/30/19   Martyn Ehrich, NP  Calcium Carbonate-Vit D-Min (CALCIUM 1200 PO) Take by mouth.    [provider]  Cholecalciferol (VITAMIN D3) 50 MCG (2000 UT) TABS Take 2,000 Units by mouth daily.    [provider]  cyanocobalamin 2000 MCG tablet Take 2,000 mcg by mouth daily. Vitamin B12    [provider]  furosemide (LASIX) 40 MG tablet Take 1 tablet (40 mg total) by mouth daily as needed (for weight gain of 3 lbs). Patient taking differently: Take 20-40 mg by mouth daily as needed (for overnight weight gain of 3 lbs 1/2 tablet (20 mg), for overnight weight gain of 5 lbs 1 tablet (40 mg)). 07/01/19   Tommie Raymond, NP  Ginkgo Biloba Extract 60 MG CAPS Take 60 mg by mouth daily.    [provider]  HYDROcodone-acetaminophen (NORCO) 7.5-325 MG tablet Take 1 tablet by mouth 2 (two) times daily as needed (pain/cough). 02/23/20   Nafziger,  Tommi Rumps, NP  HYDROcodone-acetaminophen (NORCO) 7.5-325 MG tablet Take 1 tablet by mouth every 6 (six) hours as needed for moderate pain. Patient not taking: Reported on 03/22/2020 02/23/20   [name], NP  HYDROcodone-acetaminophen (NORCO) 7.5-325 MG tablet Take 1 tablet by mouth every 6 (six) hours as needed for moderate pain. Patient not taking: Reported on 03/22/2020 02/23/20   [name], NP  ipratropium-albuterol (DUONEB) 0.5-2.5 (3) MG/3ML SOLN USE 3 ML VIA NEBULIZER EVERY 6 HOURS AS NEEDED 02/14/20   [name], MD  losartan (COZAAR) 25 MG tablet TAKE 1 TABLET EVERY DAY 01/11/20   [name], MD  magnesium oxide (  MAG-OX) 400 MG tablet Take 400 mg by mouth daily.    [provider]  methocarbamol (ROBAXIN) 500 MG tablet Robaxin 500 mg tablet  Take 1 tablet twice a day by oral route as needed for 10 days. Patient not taking: Reported on 03/22/2020    [provider]  nitroGLYCERIN (NITROSTAT) 0.4 MG SL tablet Place 1 tablet (0.4 mg total) under the tongue every 5 (five) minutes as needed for chest pain. Patient not taking: Reported on 03/22/2020 08/09/18   Lyda Jester M, PA-C  OXYGEN Inhale 2 L into the lungs continuous.    [provider]  PARoxetine (PAXIL) 30 MG tablet Take 1 tablet (30 mg total) by mouth daily. 01/11/20   Nafziger, Tommi Rumps, NP  predniSONE (DELTASONE) 5 MG tablet Take 1 tablet (5 mg total) by mouth daily with breakfast. December 5 mg daily.  January 1st decrease to 5 mg every other day. 03/05/20   [name], MD  Spacer/Aero Chamber Mouthpiece MISC 1 Device by Does not apply route as directed. 01/12/17   Javier Glazier, MD  Tiotropium Bromide Monohydrate (SPIRIVA RESPIMAT) 2.5 MCG/ACT AERS USE 2 INHALATIONS BY MOUTH  DAILY Patient taking differently: Inhale 2 puffs into the lungs daily at 12 noon. 03/29/19   Nafziger, Tommi Rumps, NP    Allergies    Ciprofloxacin, Lisinopril, and Penicillins  Review of Systems   Review  of Systems All other systems are reviewed and are negative for acute change except as noted in the HPI.  Physical Exam Updated Vital Signs BP 127/64 (BP Location: Right Arm)   Pulse 96   Temp 98.6 F (37 C) (Oral)   Resp (!) 27   Ht 5\' 3"  (1.6 m)   Wt 59 kg   SpO2 96%   BMI 23.03 kg/m   Physical Exam Vitals and nursing note reviewed.  Constitutional:      General: She is not in acute distress.    Appearance: She is not ill-appearing.     Comments: Thin appearing female.   HENT:     Head: Normocephalic and atraumatic.     Right Ear: Tympanic membrane and external ear normal.     Left Ear: Tympanic membrane and external ear normal.     Nose: Nose normal.     Mouth/Throat:     Mouth: Mucous membranes are moist.     Pharynx: Oropharynx is clear.  Eyes:     General: No scleral icterus.       Right eye: No discharge.        Left eye: No discharge.     Extraocular Movements: Extraocular movements intact.     Conjunctiva/sclera: Conjunctivae normal.     Pupils: Pupils are equal, round, and reactive to light.  Neck:     Vascular: No JVD.  Cardiovascular:     Rate and Rhythm: Normal rate and regular rhythm.     Pulses: Normal pulses.          Radial pulses are 2+ on the right side and 2+ on the left side.     Heart sounds: Normal heart sounds.  Pulmonary:     Comments: Expiratory wheeze heard in all lung fields. Symmetric chest rise. Talking in full sentences. Tachypneic. Oxygen saturation 96% on 2L [place]. Abdominal:     Comments: Abdomen is soft, non-distended, and non-tender in all quadrants. No rigidity, no guarding. No peritoneal signs.  Musculoskeletal:        General: Normal range of motion.  Cervical back: Normal range of motion.  Skin:    General: Skin is warm and dry.     Capillary Refill: Capillary refill takes less than 2 seconds.  Neurological:     Mental Status: She is oriented to person, place, and time.     GCS: GCS eye subscore is 4. GCS verbal subscore  is 5. GCS motor subscore is 6.     Comments: Fluent speech, no facial droop.  Psychiatric:        Behavior: Behavior normal.     ED Results / Procedures / Treatments   Labs (all labs ordered are listed, but only abnormal results are displayed) Labs Reviewed  CBC WITH DIFFERENTIAL/PLATELET - Abnormal; Notable for the following components:      Result Value   WBC 16.8 (*)    Neutro Abs 10.8 (*)    Lymphs Abs 4.6 (*)    Monocytes Absolute 1.1 (*)    All other components within normal limits  BASIC METABOLIC PANEL - Abnormal; Notable for the following components:   Glucose, Bld 106 (*)    All other components within normal limits  RESP PANEL BY RT-PCR (FLU A&B, COVID) [id number]    EKG None  Radiology DG Chest Portable 1 View  Result Date: 03/24/2020 CLINICAL DATA:  Dyspnea, COPD EXAM: PORTABLE CHEST 1 VIEW COMPARISON:  09/17/2019 chest radiograph. FINDINGS: Stable cardiomediastinal silhouette with normal heart size. No pneumothorax. No pleural effusion. Hyperinflated lungs. No pulmonary edema. No acute consolidative airspace disease. IMPRESSION: 1. No acute cardiopulmonary disease. 2. Hyperinflated lungs, compatible with reported COPD. Electronically Signed   By: Ilona Sorrel M.D.   On: 03/24/2020 10:26    Procedures Procedures (including critical care time)  Medications Ordered in ED Medications  0.9 %  sodium chloride infusion (500 mLs Intravenous New Bag/Given 03/24/20 1003)  magnesium sulfate IVPB 2 g 50 mL (0 g Intravenous Stopped 03/24/20 1102)  ipratropium-albuterol (DUONEB) 0.5-2.5 (3) MG/3ML nebulizer solution 3 mL (3 mLs Nebulization Given 03/24/20 1240)    ED Course  I have reviewed the triage vital signs and the nursing notes.  Pertinent labs & imaging results that were available during my care of the patient were reviewed by me and considered in my medical decision making (see chart for details).    MDM Rules/Calculators/A&P                          History  provided by patient and EMS with additional history obtained from chart review.    64 yo female presenting with likely COPD exacerbation. Afebrile, HDS. Tachypneic. On 2L [place], higher than home prn requirement. Wheezing heard in all lung fields. Already received steroids by EMS and took multiple neb treatment prior to arrival. CBC with leukocytosis 16.8, patient is on chronic prednisone, suspect could be cause. BMP unremarkable. I viewed pt's chest xray and it does not suggest acute infectious processes.  Covid and influenza tests are negative. Given IV mag and duoneb. On reassessment she continues to have increased work of breathing.  Expiratory wheezing still heard in all lung fields.  Still requiring 1L [place]. This case was discussed with ED attending Dr. Ashok Cordia who has seen the patient and agrees with plan to admit.  Spoke with Dr. Neysa Bonito  with hospitalist service who agrees to assume care of patient and bring into the hospital for further evaluation and management for COPD exacerbation.  Kim [name] was evaluated in Emergency Department on 03/24/2020 for  the symptoms described in the history of present illness. She was evaluated in the context of the global COVID-19 pandemic, which necessitated consideration that the patient might be at risk for infection with the SARS-CoV-2 virus that causes COVID-19. Institutional protocols and algorithms that pertain to the evaluation of patients at risk for COVID-19 are in a state of rapid change based on information released by regulatory bodies including the CDC and federal and state organizations. These policies and algorithms were followed during the patient's care in the ED.  Portions of this note were generated with [employer]. Dictation errors may occur despite best attempts at proofreading.   Final Clinical Impression(s) / ED Diagnoses Final diagnoses:  COPD exacerbation [facility])    Rx / DC Orders ED Discharge Orders    None        Lewanda Rife 03/24/20 1337    [name], MD 03/24/20 1348

## 2020-03-24 NOTE — ED Triage Notes (Signed)
Pt BIBA from home.   Per EMS- Pt reports ShOB, productive cough since yesterday.  EMS notes wheezing in all lung fields. 89% on RA, pt has PRN O2 for home use. Pt arrives to ED on 2L [place].   EMS gave 10 mg albuterol. 0.5 atrovent, 125 solu-medrol.   20 g L forearm. Pt continues to wheezing, some improvement noted.   AOx4, ambulatory on scene.

## 2020-03-25 LAB — BASIC METABOLIC PANEL
Anion gap: 12 (ref 5–15)
BUN: 19 mg/dL (ref 8–23)
CO2: 26 mmol/L (ref 22–32)
Calcium: 9.2 mg/dL (ref 8.9–10.3)
Chloride: 103 mmol/L (ref 98–111)
Creatinine, Ser: 0.71 mg/dL (ref 0.44–1.00)
GFR, Estimated: >60 mL/min (ref >60)
Glucose, Bld: 197 mg/dL — ABNORMAL HIGH (ref 70–99)
Potassium: 3.9 mmol/L (ref 3.5–5.1)
Sodium: 141 mmol/L (ref 135–145)

## 2020-03-25 LAB — CBC
HCT: 39.2 % (ref 36.0–46.0)
Hemoglobin: 12.5 g/dL (ref 12.0–15.0)
MCH: 31 pg (ref 26.0–34.0)
MCHC: 31.9 g/dL (ref 30.0–36.0)
MCV: 97.3 fL (ref 80.0–100.0)
Platelets: 291 10*3/uL (ref 150–400)
RBC: 4.03 MIL/uL (ref 3.87–5.11)
RDW: 14.5 % (ref 11.5–15.5)
WBC: 17.3 10*3/uL — ABNORMAL HIGH (ref 4.0–10.5)
nRBC: 0 % (ref 0.0–0.2)

## 2020-03-25 LAB — HIV ANTIBODY (ROUTINE TESTING W REFLEX): HIV Screen 4th Generation wRfx: NONREACTIVE

## 2020-03-25 MED ORDER — IBUPROFEN 400 MG PO TABS
400.0000 mg | ORAL_TABLET | Freq: Once | ORAL | Status: AC
  Administered 2020-03-30: 21:00:00 400 mg via ORAL
  Filled 2020-03-25 (×3): qty 1

## 2020-03-25 MED ORDER — DOXYCYCLINE HYCLATE 100 MG PO TABS
100.0000 mg | ORAL_TABLET | Freq: Two times a day (BID) | ORAL | Status: DC
  Administered 2020-03-25 – 2020-03-29 (×9): 100 mg via ORAL
  Filled 2020-03-25 (×9): qty 1

## 2020-03-25 MED ORDER — GUAIFENESIN ER 600 MG PO TB12
1200.0000 mg | ORAL_TABLET | Freq: Two times a day (BID) | ORAL | Status: DC
  Administered 2020-03-25 – 2020-04-03 (×19): 1200 mg via ORAL
  Filled 2020-03-25 (×21): qty 2

## 2020-03-25 MED ORDER — METHYLPREDNISOLONE SODIUM SUCC 125 MG IJ SOLR
60.0000 mg | Freq: Four times a day (QID) | INTRAMUSCULAR | Status: DC
  Administered 2020-03-25 – 2020-03-27 (×8): 60 mg via INTRAVENOUS
  Filled 2020-03-25 (×8): qty 2

## 2020-03-25 NOTE — Plan of Care (Signed)

## 2020-03-25 NOTE — Progress Notes (Signed)
[facility]  PROGRESS NOTE  Kim [name]:[id number] DOB: [birth year] DOA: 03/24/2020 PCP: [name], NP   Brief HPI:   64 year old female with history of COPD with chronic respiratory failure on steroids, hyperlipidemia, alpha-1 antitrypsin deficiency carrier, anxiety, depression who recently received pneumonia vaccine 3 days ago, also vaccinated against COVID-19 came to ED with complaints of sudden onset shortness of breath.  Patient is on oxygen as needed usually 1 ambulating long distances at 1 L/min.  Lately has been using 3 to 4 L/min since her symptom onset.  She went to [place] last week which was 5 hours drive back and forth.  Upon EMS arrival she was noted to have bilateral wheezing with SPO2 89% on room air and placed on 2 L/min of oxygen via nasal cannula.   Subjective   Patient seen and examined, still having wheezing bilaterally.   Assessment/Plan:     1. COPD exacerbation-patient presented with acute exacerbation of COPD.  CT chest was negative for PE.  Will increase the dose of Solu-Medrol to 60 mg every 6 hours, continue DuoNebs every 6 hours,.  Add Mucinex 1200 mg p.o. twice daily, doxycycline 100 mg p.o. twice daily. 2. Hypertension-blood pressure stable, losartan is currently on hold.  Will monitor patient's blood pressure and restart losartan if pressure starts to rise. 3. Anxiety/depression-continue Paxil 4. HFpEF-continue as needed Lasix 5. Leukocytosis-likely steroid-induced as patient is on steroids at home.     COVID-19 Labs  No results for input(s): DDIMER, FERRITIN, LDH, CRP in the last 72 hours.  Lab Results  Component Value Date   SARSCOV2NAA NEGATIVE 03/24/2020   [place] NEGATIVE 09/17/2019   [place] NEGATIVE 07/10/2019   [place] Not Detected 04/27/2019     Scheduled medications:   . aspirin EC  81 mg Oral Daily  . doxycycline  100 mg Oral Q12H  . enoxaparin (LOVENOX) injection  40 mg Subcutaneous Q24H  .  guaiFENesin  1,200 mg Oral BID  . ipratropium-albuterol  3 mL Nebulization Q6H  . methylPREDNISolone (SOLU-MEDROL) injection  60 mg Intravenous Q6H  . PARoxetine  30 mg Oral Daily         CBG: No results for input(s): GLUCAP in the last 168 hours.  SpO2: 96 % O2 Flow Rate (L/min): 2 L/min    CBC: Recent Labs  Lab 03/24/20 0939 03/25/20 0507  WBC 16.8* 17.3*  NEUTROABS 10.8*  --   HGB 13.1 12.5  HCT 40.6 39.2  MCV 95.1 97.3  PLT 297 831    Basic Metabolic Panel: Recent Labs  Lab 03/24/20 0939 03/25/20 0507  NA 142 141  K 3.6 3.9  CL 105 103  CO2 26 26  GLUCOSE 106* 197*  BUN 21 19  CREATININE 0.74 0.71  CALCIUM 9.3 9.2     Liver Function Tests: No results for input(s): AST, ALT, ALKPHOS, BILITOT, PROT, ALBUMIN in the last 168 hours.   Antibiotics: Anti-infectives (From admission, onward)   Start     Dose/Rate Route Frequency Ordered Stop   03/25/20 1100  doxycycline (VIBRA-TABS) tablet 100 mg        100 mg Oral Every 12 hours 03/25/20 1001         DVT prophylaxis: Lovenox  Code Status: Full code  Family Communication: No family at bedside   Consultants:    Procedures:      Objective   Vitals:   03/25/20 0438 03/25/20 0849 03/25/20 1129 03/25/20 1316  BP: 136/70   127/68  Pulse: 87  93  Resp: 18   18  Temp: 98.5 F (36.9 C)     TempSrc: Oral     SpO2: 96% 95%  96%  Weight:   60.2 kg   Height:        Intake/Output Summary (Last 24 hours) at 03/25/2020 1609 Last data filed at 03/25/2020 0200 Gross per 24 hour  Intake 271.48 ml  Output 0 ml  Net 271.48 ml    12/17 1901 - 12/19 0700 In: 321.5 [P.O.:240; I.V.:31.5] Out: 0   Filed Weights   03/24/20 5573 03/25/20 1129  Weight: 59 kg 60.2 kg    Physical Examination:    General-appears in no acute distress  Heart-S1-S2, regular, no murmur auscultated  Lungs-bilateral wheezing auscultated  Abdomen-soft, nontender, no organomegaly  Extremities-no edema in the  lower extremities  Neuro-alert, oriented x3, no focal deficit noted   Status is: Inpatient  Dispo: The patient is from: *Home              Anticipated d/c is to: Home              Anticipated d/c date is: 03/27/2020              Patient currently not medically stable for discharge  Barrier to discharge-COPD exacerbation       Data Reviewed:   Recent Results (from the past 240 hour(s))  Resp Panel by RT-PCR (Flu A&B, Covid) Nasopharyngeal Swab     Status: None   Collection Time: 03/24/20  9:50 AM   Specimen: Nasopharyngeal Swab; Nasopharyngeal(NP) swabs in vial transport medium  Result Value Ref Range Status   SARS Coronavirus 2 by RT PCR NEGATIVE NEGATIVE Final    Comment: (NOTE) SARS-CoV-2 target nucleic acids are NOT DETECTED.  The SARS-CoV-2 RNA is generally detectable in upper respiratory specimens during the acute phase of infection. The lowest concentration of SARS-CoV-2 viral copies this assay can detect is 138 copies/mL. A negative result does not preclude SARS-Cov-2 infection and should not be used as the sole basis for treatment or other patient management decisions. A negative result may occur with  improper specimen collection/handling, submission of specimen other than nasopharyngeal swab, presence of viral mutation(s) within the areas targeted by this assay, and inadequate number of viral copies(<138 copies/mL). A negative result must be combined with clinical observations, patient history, and epidemiological information. The expected result is Negative.  Fact Sheet for Patients:  EntrepreneurPulse.com.au  Fact Sheet for Healthcare Providers:  IncredibleEmployment.be  This test is no t yet approved or cleared by the [place] FDA and  has been authorized for detection and/or diagnosis of SARS-CoV-2 by FDA under an Emergency Use Authorization (EUA). This EUA will remain  in effect (meaning this test can be used)  for the duration of the COVID-19 declaration under Section 564(b)(1) of the Act, 21 U.S.C.section 360bbb-3(b)(1), unless the authorization is terminated  or revoked sooner.       Influenza A by PCR NEGATIVE NEGATIVE Final   Influenza B by PCR NEGATIVE NEGATIVE Final    Comment: (NOTE) The Xpert Xpress SARS-CoV-2/FLU/RSV plus assay is intended as an aid in the diagnosis of influenza from Nasopharyngeal swab specimens and should not be used as a sole basis for treatment. Nasal washings and aspirates are unacceptable for Xpert Xpress SARS-CoV-2/FLU/RSV testing.  Fact Sheet for Patients: EntrepreneurPulse.com.au  Fact Sheet for Healthcare Providers: IncredibleEmployment.be  This test is not yet approved or cleared by the [place] and has been authorized for  detection and/or diagnosis of SARS-CoV-2 by FDA under an Emergency Use Authorization (EUA). This EUA will remain in effect (meaning this test can be used) for the duration of the COVID-19 declaration under Section 564(b)(1) of the Act, 21 U.S.C. section 360bbb-3(b)(1), unless the authorization is terminated or revoked.  Performed at [facility], [place] [address] [place], [place] [address]     No results for input(s): LIPASE, AMYLASE in the last 168 hours. No results for input(s): AMMONIA in the last 168 hours.  Cardiac Enzymes: No results for input(s): CKTOTAL, CKMB, CKMBINDEX, TROPONINI in the last 168 hours. BNP (last 3 results) Recent Labs    07/10/19 1915 09/17/19 1621  BNP 42.4 52.5    ProBNP (last 3 results) No results for input(s): PROBNP in the last 8760 hours.  Studies:  CT ANGIO CHEST PE W OR WO CONTRAST  Result Date: 03/24/2020 CLINICAL DATA:  Shortness of breath.  History of COPD. EXAM: CT ANGIOGRAPHY CHEST WITH CONTRAST TECHNIQUE: Multidetector CT imaging of the chest was performed using the standard protocol during bolus administration  of intravenous contrast. Multiplanar CT image reconstructions and MIPs were obtained to evaluate the vascular anatomy. CONTRAST:  152mL OMNIPAQUE IOHEXOL 350 MG/ML SOLN COMPARISON:  January 31, 2020 FINDINGS: Cardiovascular: Satisfactory opacification of the pulmonary arteries to the segmental level. No evidence of pulmonary embolism. Normal heart size. No pericardial effusion. LEFT-sided coronary artery atherosclerotic calcifications. Scattered atherosclerotic calcifications throughout the aorta. Mediastinum/Nodes: No enlarged mediastinal, hilar, or axillary lymph nodes. Thyroid gland, trachea, and esophagus demonstrate no significant findings. Lungs/Pleura: No pleural effusion or pneumothorax. Severe centrilobular emphysema. There is mild superimposed diffuse ground-glass opacity with an apical predominance, similar in comparison to prior. Mild paraseptal emphysema. Biapical scarring, unchanged. Scattered areas of endobronchial debris. Mild bronchial wall thickening. Scattered tiny pulmonary nodules mild representative RIGHT lower lobe 2 mm pulmonary nodule, unchanged (series 5, image 139). Lingular pulmonary nodule measures 4 x 2 mm, unchanged (series 5, image 201). Upper Abdomen: Unchanged 12 mm RIGHT adrenal nodule since at least 2017, most consistent with a benign etiology. Musculoskeletal: Chronic moderate compression fracture deformity of T7, unchanged. New mild compression fracture deformity of L1 since October of 2021. Review of the MIP images confirms the above findings. IMPRESSION: 1. No evidence of pulmonary embolism. 2. Severe centrilobular emphysema with mild superimposed diffuse ground-glass opacity with an apical predominance, similar in comparison to prior study. Findings likely reflect smoking related lung disease/respiratory bronchiolitis. 3. New mild compression fracture deformity of L1 since October of 2021. 4. Unchanged scattered pulmonary nodules. Recommend continued annual lung cancer  screening. Aortic Atherosclerosis (ICD10-I70.0) and Emphysema (ICD10-J43.9). Electronically Signed   By: Valentino Saxon MD   On: 03/24/2020 15:14   DG Chest Portable 1 View  Result Date: 03/24/2020 CLINICAL DATA:  Dyspnea, COPD EXAM: PORTABLE CHEST 1 VIEW COMPARISON:  09/17/2019 chest radiograph. FINDINGS: Stable cardiomediastinal silhouette with normal heart size. No pneumothorax. No pleural effusion. Hyperinflated lungs. No pulmonary edema. No acute consolidative airspace disease. IMPRESSION: 1. No acute cardiopulmonary disease. 2. Hyperinflated lungs, compatible with reported COPD. Electronically Signed   By: Ilona Sorrel M.D.   On: 03/24/2020 10:26       [place]   [facility] If 7PM-7AM, please contact night-coverage at [email or web address], Office  [phone]   03/25/2020, 4:09 PM  LOS: 1 day

## 2020-03-25 NOTE — Progress Notes (Signed)
NP Sharlet Salina was paged because patient was complaining of pain from coughing, PRN Norco 2x/day was last given at 1022.

## 2020-03-26 LAB — BASIC METABOLIC PANEL
Anion gap: 10 (ref 5–15)
BUN: 30 mg/dL — ABNORMAL HIGH (ref 8–23)
CO2: 29 mmol/L (ref 22–32)
Calcium: 9.3 mg/dL (ref 8.9–10.3)
Chloride: 104 mmol/L (ref 98–111)
Creatinine, Ser: 0.89 mg/dL (ref 0.44–1.00)
GFR, Estimated: >60 mL/min (ref >60)
Glucose, Bld: 174 mg/dL — ABNORMAL HIGH (ref 70–99)
Potassium: 4.2 mmol/L (ref 3.5–5.1)
Sodium: 143 mmol/L (ref 135–145)

## 2020-03-26 LAB — CBC
HCT: 39 % (ref 36.0–46.0)
Hemoglobin: 12.4 g/dL (ref 12.0–15.0)
MCH: 31.1 pg (ref 26.0–34.0)
MCHC: 31.8 g/dL (ref 30.0–36.0)
MCV: 97.7 fL (ref 80.0–100.0)
Platelets: 300 10*3/uL (ref 150–400)
RBC: 3.99 MIL/uL (ref 3.87–5.11)
RDW: 14.6 % (ref 11.5–15.5)
WBC: 23.8 10*3/uL — ABNORMAL HIGH (ref 4.0–10.5)
nRBC: 0 % (ref 0.0–0.2)

## 2020-03-26 MED ORDER — BUDESONIDE 0.25 MG/2ML IN SUSP
0.2500 mg | Freq: Two times a day (BID) | RESPIRATORY_TRACT | Status: DC
  Administered 2020-03-26 – 2020-03-30 (×9): 0.25 mg via RESPIRATORY_TRACT
  Filled 2020-03-26 (×11): qty 2

## 2020-03-26 NOTE — Progress Notes (Signed)
[facility]  PROGRESS NOTE  Kim [name]:[id number] DOB: December 22, 1955 DOA: 03/24/2020 PCP: [name], NP   Brief HPI:   64 year old female with history of COPD with chronic respiratory failure on steroids, hyperlipidemia, alpha-1 antitrypsin deficiency carrier, anxiety, depression who recently received pneumonia vaccine 3 days ago, also vaccinated against COVID-19 came to ED with complaints of sudden onset shortness of breath.  Patient is on oxygen as needed usually 1 ambulating long distances at 1 L/min.  Lately has been using 3 to 4 L/min since her symptom onset.  She went to [place] last week which was 5 hours drive back and forth.  Upon EMS arrival she was noted to have bilateral wheezing with SPO2 89% on room air and placed on 2 L/min of oxygen via nasal cannula.   Subjective   Patient seen and examined, breathing little better than yesterday.  Oxygen requirement down to 2 L/min.   Assessment/Plan:     1. COPD exacerbation-patient presented with acute exacerbation of COPD.  CT chest was negative for PE.  Continue  Solu-Medrol  60 mg every 6 hours, continue DuoNebs every 6 hours,.  Added Mucinex 1200 mg p.o. twice daily, doxycycline 100 mg p.o. twice daily.  We will also add Pulmicort nebulizer twice a day. 2. Hypertension-blood pressure stable, losartan is currently on hold.  Will monitor patient's blood pressure and restart losartan if pressure starts to rise. 3. Anxiety/depression-continue Paxil 4. HFpEF-continue as needed Lasix 5. Leukocytosis-likely steroid-induced as patient is on steroids at home.     COVID-19 Labs  No results for input(s): DDIMER, FERRITIN, LDH, CRP in the last 72 hours.  Lab Results  Component Value Date   SARSCOV2NAA NEGATIVE 03/24/2020   [place] NEGATIVE 09/17/2019   [place] NEGATIVE 07/10/2019   [place] Not Detected 04/27/2019     Scheduled medications:   . aspirin EC  81 mg Oral Daily  . budesonide (PULMICORT)  nebulizer solution  0.25 mg Nebulization BID  . doxycycline  100 mg Oral Q12H  . enoxaparin (LOVENOX) injection  40 mg Subcutaneous Q24H  . guaiFENesin  1,200 mg Oral BID  . ibuprofen  400 mg Oral Once  . ipratropium-albuterol  3 mL Nebulization Q6H  . methylPREDNISolone (SOLU-MEDROL) injection  60 mg Intravenous Q6H  . PARoxetine  30 mg Oral Daily         CBG: No results for input(s): GLUCAP in the last 168 hours.  SpO2: 97 % O2 Flow Rate (L/min): 3 L/min (decreased to 2 lpm(uses prn at home))    CBC: Recent Labs  Lab 03/24/20 0939 03/25/20 0507 03/26/20 0431  WBC 16.8* 17.3* 23.8*  NEUTROABS 10.8*  --   --   HGB 13.1 12.5 12.4  HCT 40.6 39.2 39.0  MCV 95.1 97.3 97.7  PLT 297 291 314    Basic Metabolic Panel: Recent Labs  Lab 03/24/20 0939 03/25/20 0507 03/26/20 0431  NA 142 141 143  K 3.6 3.9 4.2  CL 105 103 104  CO2 26 26 29   GLUCOSE 106* 197* 174*  BUN 21 19 30*  CREATININE 0.74 0.71 0.89  CALCIUM 9.3 9.2 9.3     Liver Function Tests: No results for input(s): AST, ALT, ALKPHOS, BILITOT, PROT, ALBUMIN in the last 168 hours.   Antibiotics: Anti-infectives (From admission, onward)   Start     Dose/Rate Route Frequency Ordered Stop   03/25/20 1100  doxycycline (VIBRA-TABS) tablet 100 mg        100 mg Oral Every 12 hours  03/25/20 1001         DVT prophylaxis: Lovenox  Code Status: Full code  Family Communication: No family at bedside   Consultants:    Procedures:      Objective   Vitals:   03/25/20 2054 03/26/20 0235 03/26/20 0459 03/26/20 0750  BP: 126/68  130/71   Pulse: 92  87   Resp: 18  15   Temp: 97.6 F (36.4 C)  98 F (36.7 C)   TempSrc:      SpO2: 96% 94% 98% 97%  Weight:      Height:        Intake/Output Summary (Last 24 hours) at 03/26/2020 1043 Last data filed at 03/26/2020 1000 Gross per 24 hour  Intake 400 ml  Output 300 ml  Net 100 ml    12/18 1901 - 12/20 0700 In: 480 [P.O.:480] Out: 0   Filed  Weights   03/24/20 0939 03/25/20 1129  Weight: 59 kg 60.2 kg    Physical Examination:    General-appears in no acute distress  Heart-S1-S2, regular, no murmur auscultated  Lungs-bilateral wheezing auscultated  Abdomen-soft, nontender, no organomegaly  Extremities-no edema in the lower extremities  Neuro-alert, oriented x3, no focal deficit noted   Status is: Inpatient  Dispo: The patient is from: *Home              Anticipated d/c is to: Home              Anticipated d/c date is: 03/27/2020              Patient currently not medically stable for discharge  Barrier to discharge-COPD exacerbation       Data Reviewed:   Recent Results (from the past 240 hour(s))  Resp Panel by RT-PCR (Flu A&B, Covid) Nasopharyngeal Swab     Status: None   Collection Time: 03/24/20  9:50 AM   Specimen: Nasopharyngeal Swab; Nasopharyngeal(NP) swabs in vial transport medium  Result Value Ref Range Status   SARS Coronavirus 2 by RT PCR NEGATIVE NEGATIVE Final    Comment: (NOTE) SARS-CoV-2 target nucleic acids are NOT DETECTED.  The SARS-CoV-2 RNA is generally detectable in upper respiratory specimens during the acute phase of infection. The lowest concentration of SARS-CoV-2 viral copies this assay can detect is 138 copies/mL. A negative result does not preclude SARS-Cov-2 infection and should not be used as the sole basis for treatment or other patient management decisions. A negative result may occur with  improper specimen collection/handling, submission of specimen other than nasopharyngeal swab, presence of viral mutation(s) within the areas targeted by this assay, and inadequate number of viral copies(<138 copies/mL). A negative result must be combined with clinical observations, patient history, and epidemiological information. The expected result is Negative.  Fact Sheet for Patients:  EntrepreneurPulse.com.au  Fact Sheet for Healthcare Providers:   IncredibleEmployment.be  This test is no t yet approved or cleared by the [place] FDA and  has been authorized for detection and/or diagnosis of SARS-CoV-2 by FDA under an Emergency Use Authorization (EUA). This EUA will remain  in effect (meaning this test can be used) for the duration of the COVID-19 declaration under Section 564(b)(1) of the Act, 21 U.S.C.section 360bbb-3(b)(1), unless the authorization is terminated  or revoked sooner.       Influenza A by PCR NEGATIVE NEGATIVE Final   Influenza B by PCR NEGATIVE NEGATIVE Final    Comment: (NOTE) The Xpert Xpress SARS-CoV-2/FLU/RSV plus assay is intended as an aid  in the diagnosis of influenza from Nasopharyngeal swab specimens and should not be used as a sole basis for treatment. Nasal washings and aspirates are unacceptable for Xpert Xpress SARS-CoV-2/FLU/RSV testing.  Fact Sheet for Patients: EntrepreneurPulse.com.au  Fact Sheet for Healthcare Providers: IncredibleEmployment.be  This test is not yet approved or cleared by the [place] FDA and has been authorized for detection and/or diagnosis of SARS-CoV-2 by FDA under an Emergency Use Authorization (EUA). This EUA will remain in effect (meaning this test can be used) for the duration of the COVID-19 declaration under Section 564(b)(1) of the Act, 21 U.S.C. section 360bbb-3(b)(1), unless the authorization is terminated or revoked.  Performed at [facility], [place] [address] [place], [place] [address]     No results for input(s): LIPASE, AMYLASE in the last 168 hours. No results for input(s): AMMONIA in the last 168 hours.  Cardiac Enzymes: No results for input(s): CKTOTAL, CKMB, CKMBINDEX, TROPONINI in the last 168 hours. BNP (last 3 results) Recent Labs    07/10/19 1915 09/17/19 1621  BNP 42.4 52.5    ProBNP (last 3 results) No results for input(s): PROBNP in the last 8760  hours.  Studies:  CT ANGIO CHEST PE W OR WO CONTRAST  Result Date: 03/24/2020 CLINICAL DATA:  Shortness of breath.  History of COPD. EXAM: CT ANGIOGRAPHY CHEST WITH CONTRAST TECHNIQUE: Multidetector CT imaging of the chest was performed using the standard protocol during bolus administration of intravenous contrast. Multiplanar CT image reconstructions and MIPs were obtained to evaluate the vascular anatomy. CONTRAST:  139mL OMNIPAQUE IOHEXOL 350 MG/ML SOLN COMPARISON:  January 31, 2020 FINDINGS: Cardiovascular: Satisfactory opacification of the pulmonary arteries to the segmental level. No evidence of pulmonary embolism. Normal heart size. No pericardial effusion. LEFT-sided coronary artery atherosclerotic calcifications. Scattered atherosclerotic calcifications throughout the aorta. Mediastinum/Nodes: No enlarged mediastinal, hilar, or axillary lymph nodes. Thyroid gland, trachea, and esophagus demonstrate no significant findings. Lungs/Pleura: No pleural effusion or pneumothorax. Severe centrilobular emphysema. There is mild superimposed diffuse ground-glass opacity with an apical predominance, similar in comparison to prior. Mild paraseptal emphysema. Biapical scarring, unchanged. Scattered areas of endobronchial debris. Mild bronchial wall thickening. Scattered tiny pulmonary nodules mild representative RIGHT lower lobe 2 mm pulmonary nodule, unchanged (series 5, image 139). Lingular pulmonary nodule measures 4 x 2 mm, unchanged (series 5, image 201). Upper Abdomen: Unchanged 12 mm RIGHT adrenal nodule since at least 2017, most consistent with a benign etiology. Musculoskeletal: Chronic moderate compression fracture deformity of T7, unchanged. New mild compression fracture deformity of L1 since October of 2021. Review of the MIP images confirms the above findings. IMPRESSION: 1. No evidence of pulmonary embolism. 2. Severe centrilobular emphysema with mild superimposed diffuse ground-glass opacity with  an apical predominance, similar in comparison to prior study. Findings likely reflect smoking related lung disease/respiratory bronchiolitis. 3. New mild compression fracture deformity of L1 since October of 2021. 4. Unchanged scattered pulmonary nodules. Recommend continued annual lung cancer screening. Aortic Atherosclerosis (ICD10-I70.0) and Emphysema (ICD10-J43.9). Electronically Signed   By: Valentino Saxon MD   On: 03/24/2020 15:14       Oswald Hillock   [facility] If 7PM-7AM, please contact night-coverage at [email or web address], Office  [phone]   03/26/2020, 10:43 AM  LOS: 2 days

## 2020-03-27 LAB — CBC
HCT: 40.6 % (ref 36.0–46.0)
Hemoglobin: 12.9 g/dL (ref 12.0–15.0)
MCH: 31.4 pg (ref 26.0–34.0)
MCHC: 31.8 g/dL (ref 30.0–36.0)
MCV: 98.8 fL (ref 80.0–100.0)
Platelets: 321 10*3/uL (ref 150–400)
RBC: 4.11 MIL/uL (ref 3.87–5.11)
RDW: 14.3 % (ref 11.5–15.5)
WBC: 19.5 10*3/uL — ABNORMAL HIGH (ref 4.0–10.5)
nRBC: 0 % (ref 0.0–0.2)

## 2020-03-27 LAB — BASIC METABOLIC PANEL
Anion gap: 7 (ref 5–15)
BUN: 30 mg/dL — ABNORMAL HIGH (ref 8–23)
CO2: 29 mmol/L (ref 22–32)
Calcium: 9.2 mg/dL (ref 8.9–10.3)
Chloride: 104 mmol/L (ref 98–111)
Creatinine, Ser: 0.76 mg/dL (ref 0.44–1.00)
GFR, Estimated: >60 mL/min (ref >60)
Glucose, Bld: 131 mg/dL — ABNORMAL HIGH (ref 70–99)
Potassium: 4 mmol/L (ref 3.5–5.1)
Sodium: 140 mmol/L (ref 135–145)

## 2020-03-27 MED ORDER — METHYLPREDNISOLONE SODIUM SUCC 40 MG IJ SOLR
40.0000 mg | Freq: Four times a day (QID) | INTRAMUSCULAR | Status: DC
  Administered 2020-03-27 – 2020-03-29 (×8): 40 mg via INTRAVENOUS
  Filled 2020-03-27 (×8): qty 1

## 2020-03-27 NOTE — Care Management Important Message (Signed)
Important Message  Patient Details IM Letter given to the Patient. Name: Kim [name] MRN: [id number] Date of Birth: 1955-11-30   Medicare Important Message Given:  Yes     Kerin Salen 03/27/2020, 12:11 PM

## 2020-03-27 NOTE — Progress Notes (Signed)
[facility]  PROGRESS NOTE  Kim [name]:[id number] DOB: 1955/12/07 DOA: 03/24/2020 PCP: [name], NP   Brief HPI:   64 year old female with history of COPD with chronic respiratory failure on steroids, hyperlipidemia, alpha-1 antitrypsin deficiency carrier, anxiety, depression who recently received pneumonia vaccine 3 days ago, also vaccinated against COVID-19 came to ED with complaints of sudden onset shortness of breath.  Patient is on oxygen as needed usually 1 ambulating long distances at 1 L/min.  Lately has been using 3 to 4 L/min since her symptom onset.  She went to [place] last week which was 5 hours drive back and forth.  Upon EMS arrival she was noted to have bilateral wheezing with SPO2 89% on room air and placed on 2 L/min of oxygen via nasal cannula.   Subjective   Patient seen and examined, breathing has improved.  Now requiring 2 L/min of oxygen.  Still feels that she is not back to baseline.   Assessment/Plan:     1. COPD exacerbation-patient presented with acute exacerbation of COPD.  CT chest was negative for PE.  She was started on Solu-Medrol  60 mg every 6 hours,  DuoNebs every 6 hours,.  Added Mucinex 1200 mg p.o. twice daily, doxycycline 100 mg p.o. twice daily.  Also added Pulmicort nebulizer twice a day.  At this time patient breathing has improved.  Though she is still not back to baseline.  Will cut down the dose of Solu-Medrol to 40 mg IV every 6 hours.  Hopefully patient can be switched to p.o. prednisone from tomorrow and discharge home. 2. Hypertension-blood pressure stable, losartan is currently on hold.  Will monitor patient's blood pressure and restart losartan if pressure starts to rise. 3. Anxiety/depression-continue Paxil 4. HFpEF-continue as needed Lasix 5. Leukocytosis-likely steroid-induced.     COVID-19 Labs  No results for input(s): DDIMER, FERRITIN, LDH, CRP in the last 72 hours.  Lab Results  Component Value Date    SARSCOV2NAA NEGATIVE 03/24/2020   [place] NEGATIVE 09/17/2019   [place] NEGATIVE 07/10/2019   [place] Not Detected 04/27/2019     Scheduled medications:   . aspirin EC  81 mg Oral Daily  . budesonide (PULMICORT) nebulizer solution  0.25 mg Nebulization BID  . doxycycline  100 mg Oral Q12H  . enoxaparin (LOVENOX) injection  40 mg Subcutaneous Q24H  . guaiFENesin  1,200 mg Oral BID  . ibuprofen  400 mg Oral Once  . ipratropium-albuterol  3 mL Nebulization Q6H  . methylPREDNISolone (SOLU-MEDROL) injection  40 mg Intravenous Q6H  . PARoxetine  30 mg Oral Daily         CBG: No results for input(s): GLUCAP in the last 168 hours.  SpO2: 97 % O2 Flow Rate (L/min): 2 L/min    CBC: Recent Labs  Lab 03/24/20 0939 03/25/20 0507 03/26/20 0431 03/27/20 0448  WBC 16.8* 17.3* 23.8* 19.5*  NEUTROABS 10.8*  --   --   --   HGB 13.1 12.5 12.4 12.9  HCT 40.6 39.2 39.0 40.6  MCV 95.1 97.3 97.7 98.8  PLT 297 291 300 811    Basic Metabolic Panel: Recent Labs  Lab 03/24/20 0939 03/25/20 0507 03/26/20 0431 03/27/20 0448  NA 142 141 143 140  K 3.6 3.9 4.2 4.0  CL 105 103 104 104  CO2 26 26 29 29   GLUCOSE 106* 197* 174* 131*  BUN 21 19 30* 30*  CREATININE 0.74 0.71 0.89 0.76  CALCIUM 9.3 9.2 9.3 9.2  Liver Function Tests: No results for input(s): AST, ALT, ALKPHOS, BILITOT, PROT, ALBUMIN in the last 168 hours.   Antibiotics: Anti-infectives (From admission, onward)   Start     Dose/Rate Route Frequency Ordered Stop   03/25/20 1100  doxycycline (VIBRA-TABS) tablet 100 mg        100 mg Oral Every 12 hours 03/25/20 1001         DVT prophylaxis: Lovenox  Code Status: Full code  Family Communication: No family at bedside   Consultants:    Procedures:      Objective   Vitals:   03/27/20 0629 03/27/20 1057 03/27/20 1100 03/27/20 1318  BP: (!) 151/79   (!) 143/87  Pulse: 88   86  Resp: 18   18  Temp: 97.8 F (36.6 C)     TempSrc: Oral      SpO2: 96% 98% 98% 97%  Weight:      Height:        Intake/Output Summary (Last 24 hours) at 03/27/2020 1424 Last data filed at 03/27/2020 1400 Gross per 24 hour  Intake 1440 ml  Output 450 ml  Net 990 ml    12/19 1901 - 12/21 0700 In: [id number] [P.O.:1440; I.V.:40] Out: 300 [Urine:300]  Filed Weights   03/24/20 0939 03/25/20 1129  Weight: 59 kg 60.2 kg    Physical Examination:    General-appears in no acute distress  Heart-S1-S2, regular, no murmur auscultated  Lungs-scattered wheezing bilaterally  Abdomen-soft, nontender, no organomegaly  Extremities-no edema in the lower extremities  Neuro-alert, oriented x3, no focal deficit noted   Status is: Inpatient  Dispo: The patient is from: *Home              Anticipated d/c is to: Home              Anticipated d/c date is: 03/28/2020              Patient currently not medically stable for discharge  Barrier to discharge-COPD exacerbation       Data Reviewed:   Recent Results (from the past 240 hour(s))  Resp Panel by RT-PCR (Flu A&B, Covid) Nasopharyngeal Swab     Status: None   Collection Time: 03/24/20  9:50 AM   Specimen: Nasopharyngeal Swab; Nasopharyngeal(NP) swabs in vial transport medium  Result Value Ref Range Status   SARS Coronavirus 2 by RT PCR NEGATIVE NEGATIVE Final    Comment: (NOTE) SARS-CoV-2 target nucleic acids are NOT DETECTED.  The SARS-CoV-2 RNA is generally detectable in upper respiratory specimens during the acute phase of infection. The lowest concentration of SARS-CoV-2 viral copies this assay can detect is 138 copies/mL. A negative result does not preclude SARS-Cov-2 infection and should not be used as the sole basis for treatment or other patient management decisions. A negative result may occur with  improper specimen collection/handling, submission of specimen other than nasopharyngeal swab, presence of viral mutation(s) within the areas targeted by this assay, and  inadequate number of viral copies(<138 copies/mL). A negative result must be combined with clinical observations, patient history, and epidemiological information. The expected result is Negative.  Fact Sheet for Patients:  EntrepreneurPulse.com.au  Fact Sheet for Healthcare Providers:  IncredibleEmployment.be  This test is no t yet approved or cleared by the [place] FDA and  has been authorized for detection and/or diagnosis of SARS-CoV-2 by FDA under an Emergency Use Authorization (EUA). This EUA will remain  in effect (meaning this test can be used) for the duration  of the COVID-19 declaration under Section 564(b)(1) of the Act, 21 U.S.C.section 360bbb-3(b)(1), unless the authorization is terminated  or revoked sooner.       Influenza A by PCR NEGATIVE NEGATIVE Final   Influenza B by PCR NEGATIVE NEGATIVE Final    Comment: (NOTE) The Xpert Xpress SARS-CoV-2/FLU/RSV plus assay is intended as an aid in the diagnosis of influenza from Nasopharyngeal swab specimens and should not be used as a sole basis for treatment. Nasal washings and aspirates are unacceptable for Xpert Xpress SARS-CoV-2/FLU/RSV testing.  Fact Sheet for Patients: EntrepreneurPulse.com.au  Fact Sheet for Healthcare Providers: IncredibleEmployment.be  This test is not yet approved or cleared by the [place] FDA and has been authorized for detection and/or diagnosis of SARS-CoV-2 by FDA under an Emergency Use Authorization (EUA). This EUA will remain in effect (meaning this test can be used) for the duration of the COVID-19 declaration under Section 564(b)(1) of the Act, 21 U.S.C. section 360bbb-3(b)(1), unless the authorization is terminated or revoked.  Performed at [facility], [place] [address] [place], [place] [address]     No results for input(s): LIPASE, AMYLASE in the last 168 hours. No results  for input(s): AMMONIA in the last 168 hours.  Cardiac Enzymes: No results for input(s): CKTOTAL, CKMB, CKMBINDEX, TROPONINI in the last 168 hours. BNP (last 3 results) Recent Labs    07/10/19 1915 09/17/19 1621  BNP 42.4 52.5     [place]   [facility] If 7PM-7AM, please contact night-coverage at [email or web address], Office  [phone]   03/27/2020, 2:24 PM  LOS: 3 days

## 2020-03-28 LAB — BASIC METABOLIC PANEL
Anion gap: 12 (ref 5–15)
BUN: 39 mg/dL — ABNORMAL HIGH (ref 8–23)
CO2: 31 mmol/L (ref 22–32)
Calcium: 9.1 mg/dL (ref 8.9–10.3)
Chloride: 98 mmol/L (ref 98–111)
Creatinine, Ser: 0.8 mg/dL (ref 0.44–1.00)
GFR, Estimated: >60 mL/min (ref >60)
Glucose, Bld: 130 mg/dL — ABNORMAL HIGH (ref 70–99)
Potassium: 3.7 mmol/L (ref 3.5–5.1)
Sodium: 141 mmol/L (ref 135–145)

## 2020-03-28 LAB — CBC
HCT: 41.1 % (ref 36.0–46.0)
Hemoglobin: 13.4 g/dL (ref 12.0–15.0)
MCH: 31.3 pg (ref 26.0–34.0)
MCHC: 32.6 g/dL (ref 30.0–36.0)
MCV: 96 fL (ref 80.0–100.0)
Platelets: 323 10*3/uL (ref 150–400)
RBC: 4.28 MIL/uL (ref 3.87–5.11)
RDW: 14.1 % (ref 11.5–15.5)
WBC: 15.8 10*3/uL — ABNORMAL HIGH (ref 4.0–10.5)
nRBC: 0 % (ref 0.0–0.2)

## 2020-03-28 LAB — EXPECTORATED SPUTUM ASSESSMENT W GRAM STAIN, RFLX TO RESP C

## 2020-03-28 MED ORDER — SALINE SPRAY 0.65 % NA SOLN
1.0000 | NASAL | Status: DC | PRN
  Filled 2020-03-28: qty 44

## 2020-03-28 MED ORDER — LOSARTAN POTASSIUM 25 MG PO TABS
25.0000 mg | ORAL_TABLET | Freq: Every day | ORAL | Status: DC
  Administered 2020-03-29 – 2020-04-03 (×6): 25 mg via ORAL
  Filled 2020-03-28 (×6): qty 1

## 2020-03-28 NOTE — Progress Notes (Signed)
PROGRESS NOTE    Kim [name]  [id number] DOB: [birth year] DOA: 03/24/2020 PCP: [name], NP    Chief Complaint  Patient presents with  . Shortness of Breath    Brief Narrative:   64 year old female with history of COPD with chronic respiratory failure on steroids, hyperlipidemia, alpha-1 antitrypsin deficiency carrier, anxiety, depression who recently received pneumonia vaccine 3 days ago, also vaccinated against COVID-19 came to ED with complaints of sudden onset shortness of breath.  Patient is on oxygen as needed usually 1 ambulating long distances at 1 L/min.  Lately has been using 3 to 4 L/min since her symptom onset.  She went to [place] last week which was 5 hours drive back and forth.  Upon EMS arrival she was noted to have bilateral wheezing with SPO2 89% on room air and placed on 2 L/min of oxygen via nasal cannula. Subjective:   Continue wheezing, cough, denies chest pain Reports not at baseline, ( baseline she only need o2 at night, she still required o2 continuously and need to increase o2 when ambulating due to significant dyspnea on exertion )  Assessment & Plan:   Principal Problem:   COPD exacerbation (HCC) Active Problems:   Anxiety and depression   Chronic diastolic CHF (congestive heart failure) (HCC)   Essential hypertension  COPD exacerbation/acute on chronic hypoxic respiratory failure -CTA on presentation no PE, does show severe centrilobular emphysema with mild superimposed diffuse groundglass opacity which is chronic -Influenza and COVID-19 screening negative -She was on Solu-Medrol 60 mg every 6 hours, now 40 mg every 6 hours from December 21, she continued to wheeze, continue DuoNeb, Mucinex, doxycycline -Wean oxygen as tolerated  Leukocytosis -Likely steroid induced, improving   Hypertension Blood pressure soft on presentation.  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/11/2019 EF 60-65%, no LV wall motion abnormalities, grade 1 diastolic dysfunction Take Lasix as needed at home, currently no edema on exam  Anxiety/depression-continue Paxil   Unchanged scattered pulmonary nodules  New mild compression fracture deformity of L1 since October of 2021   DVT prophylaxis: enoxaparin (LOVENOX) injection 40 mg Start: 03/24/20 2200   Code Status:DNR Family Communication: Patient Disposition:   Status is: Inpatient   Dispo: The patient is from: Home              Anticipated d/c is to: Home              Anticipated d/c date is: 24 to 48 hours depends on respiratory status                Consultants:   None  Procedures:   None  Antimicrobials:   Doxycycline     Objective: Vitals:   03/27/20 2147 03/28/20 0210 03/28/20 0547 03/28/20 0820  BP: 124/70  (!) 149/68   Pulse: 92  76   Resp: 18  18   Temp: (!) 97.5 F (36.4 C)  97.7 F (36.5 C)   TempSrc: Oral  Oral   SpO2: 93% 98% 96% 97%  Weight:      Height:        Intake/Output Summary (Last 24 hours) at 03/28/2020 1108 Last data filed at 03/28/2020 1000 Gross per 24 hour  Intake 1320 ml  Output 450 ml  Net 870 ml   Filed Weights   03/24/20 0939 03/25/20 1129  Weight: 59 kg 60.2 kg    Examination:  General exam: calm, NAD  Respiratory system: Diffuse bilateral wheezing. Respiratory effort normal. Cardiovascular system: S1 & S2 heard, RRR. No JVD, no murmur, No pedal edema. Gastrointestinal system: Abdomen is nondistended, soft and nontender. Normal bowel sounds heard. Central nervous system: Alert and oriented. No focal neurological deficits. Extremities: Symmetric 5 x 5 power. Skin: No rashes, lesions or ulcers Psychiatry: Judgement and insight appear normal. Mood & affect appropriate.     Data Reviewed: I have personally reviewed following labs and imaging studies  CBC: Recent Labs  Lab 03/24/20 0939 03/25/20 0507 03/26/20 0431  03/27/20 0448 03/28/20 0525  WBC 16.8* 17.3* 23.8* 19.5* 15.8*  NEUTROABS 10.8*  --   --   --   --   HGB 13.1 12.5 12.4 12.9 13.4  HCT 40.6 39.2 39.0 40.6 41.1  MCV 95.1 97.3 97.7 98.8 96.0  PLT 297 291 300 321 950    Basic Metabolic Panel: Recent Labs  Lab 03/24/20 0939 03/25/20 0507 03/26/20 0431 03/27/20 0448 03/28/20 0525  NA 142 141 143 140 141  K 3.6 3.9 4.2 4.0 3.7  CL 105 103 104 104 98  CO2 26 26 29 29 31   GLUCOSE 106* 197* 174* 131* 130*  BUN 21 19 30* 30* 39*  CREATININE 0.74 0.71 0.89 0.76 0.80  CALCIUM 9.3 9.2 9.3 9.2 9.1    GFR: Estimated Creatinine Clearance: 57.2 mL/min (by C-G formula based on SCr of 0.8 mg/dL).  Liver Function Tests: No results for input(s): AST, ALT, ALKPHOS, BILITOT, PROT, ALBUMIN in the last 168 hours.  CBG: No results for input(s): GLUCAP in the last 168 hours.   Recent Results (from the past 240 hour(s))  Resp Panel by RT-PCR (Flu A&B, Covid) Nasopharyngeal Swab     Status: None   Collection Time: 03/24/20  9:50 AM   Specimen: Nasopharyngeal Swab; Nasopharyngeal(NP) swabs in vial transport medium  Result Value Ref Range Status   SARS Coronavirus 2 by RT PCR NEGATIVE NEGATIVE Final    Comment: (NOTE) SARS-CoV-2 target nucleic acids are NOT DETECTED.  The SARS-CoV-2 RNA is generally detectable in upper respiratory specimens during the acute phase of infection. The lowest concentration of SARS-CoV-2 viral copies this assay can detect is 138 copies/mL. A negative result does not preclude SARS-Cov-2 infection and should not be used as the sole basis for treatment or other patient management decisions. A negative result may occur with  improper specimen collection/handling, submission of specimen other than nasopharyngeal swab, presence of viral mutation(s) within the areas targeted by this assay, and inadequate number of viral copies(<138 copies/mL). A negative result must be combined with clinical observations, patient  history, and epidemiological information. The expected result is Negative.  Fact Sheet for Patients:  EntrepreneurPulse.com.au  Fact Sheet for Healthcare Providers:  IncredibleEmployment.be  This test is no t yet approved or cleared by the [place] FDA and  has been authorized for detection and/or diagnosis of SARS-CoV-2 by FDA under an Emergency Use Authorization (EUA). This EUA will remain  in effect (meaning this test can be used) for the duration of the COVID-19 declaration under Section 564(b)(1) of the Act, 21 U.S.C.section 360bbb-3(b)(1), unless the authorization is terminated  or revoked sooner.       Influenza A by PCR NEGATIVE NEGATIVE Final   Influenza B by PCR NEGATIVE NEGATIVE Final    Comment: (NOTE) The Xpert Xpress SARS-CoV-2/FLU/RSV plus assay is intended as an aid in the diagnosis of influenza from Nasopharyngeal swab specimens and should not be used as a sole basis for  treatment. Nasal washings and aspirates are unacceptable for Xpert Xpress SARS-CoV-2/FLU/RSV testing.  Fact Sheet for Patients: EntrepreneurPulse.com.au  Fact Sheet for Healthcare Providers: IncredibleEmployment.be  This test is not yet approved or cleared by the [place] FDA and has been authorized for detection and/or diagnosis of SARS-CoV-2 by FDA under an Emergency Use Authorization (EUA). This EUA will remain in effect (meaning this test can be used) for the duration of the COVID-19 declaration under Section 564(b)(1) of the Act, 21 U.S.C. section 360bbb-3(b)(1), unless the authorization is terminated or revoked.  Performed at [facility], [place] [address] [place], [place] [address]          Radiology Studies: No results found.      Scheduled Meds: . aspirin EC  81 mg Oral Daily  . budesonide (PULMICORT) nebulizer solution  0.25 mg Nebulization BID  . doxycycline  100 mg  Oral Q12H  . enoxaparin (LOVENOX) injection  40 mg Subcutaneous Q24H  . guaiFENesin  1,200 mg Oral BID  . ibuprofen  400 mg Oral Once  . ipratropium-albuterol  3 mL Nebulization Q6H  . methylPREDNISolone (SOLU-MEDROL) injection  40 mg Intravenous Q6H  . PARoxetine  30 mg Oral Daily   Continuous Infusions: . sodium chloride Stopped (03/24/20 1625)     LOS: 4 days   Time spent: 25 mins Greater than 50% of this time was spent in counseling, explanation of diagnosis, planning of further management, and coordination of care.  I have personally reviewed and interpreted on  03/28/2020 daily labs,I reviewed all nursing notes, pharmacy notes,  vitals, pertinent old records  I have discussed plan of care as described above with RN , patient  on 03/28/2020  Voice Recognition /Dragon dictation system was used to create this note, attempts have been made to correct errors. Please contact the author with questions and/or clarifications.   Florencia Reasons, MD PhD FACP [facility]  Available via Epic secure chat 7am-7pm for nonurgent issues Please page for urgent issues To page the attending provider between 7A-7P or the covering provider during after hours 7P-7A, please log into the web site [email or web address] and access using universal  password for that web site. If you do not have the password, please call the hospital operator.    03/28/2020, 11:08 AM

## 2020-03-29 LAB — BASIC METABOLIC PANEL
Anion gap: 11 (ref 5–15)
BUN: 32 mg/dL — ABNORMAL HIGH (ref 8–23)
CO2: 33 mmol/L — ABNORMAL HIGH (ref 22–32)
Calcium: 9.1 mg/dL (ref 8.9–10.3)
Chloride: 99 mmol/L (ref 98–111)
Creatinine, Ser: 0.82 mg/dL (ref 0.44–1.00)
GFR, Estimated: >60 mL/min (ref >60)
Glucose, Bld: 134 mg/dL — ABNORMAL HIGH (ref 70–99)
Potassium: 4.6 mmol/L (ref 3.5–5.1)
Sodium: 143 mmol/L (ref 135–145)

## 2020-03-29 LAB — CBC
HCT: 41.8 % (ref 36.0–46.0)
Hemoglobin: 13.5 g/dL (ref 12.0–15.0)
MCH: 30.9 pg (ref 26.0–34.0)
MCHC: 32.3 g/dL (ref 30.0–36.0)
MCV: 95.7 fL (ref 80.0–100.0)
Platelets: 337 10*3/uL (ref 150–400)
RBC: 4.37 MIL/uL (ref 3.87–5.11)
RDW: 13.8 % (ref 11.5–15.5)
WBC: 14.3 10*3/uL — ABNORMAL HIGH (ref 4.0–10.5)
nRBC: 0 % (ref 0.0–0.2)

## 2020-03-29 LAB — MRSA PCR SCREENING: MRSA by PCR: NEGATIVE

## 2020-03-29 MED ORDER — METHYLPREDNISOLONE SODIUM SUCC 125 MG IJ SOLR
60.0000 mg | Freq: Four times a day (QID) | INTRAMUSCULAR | Status: DC
  Administered 2020-03-29 – 2020-04-01 (×13): 60 mg via INTRAVENOUS
  Filled 2020-03-29 (×13): qty 2

## 2020-03-29 MED ORDER — LEVOFLOXACIN IN D5W 750 MG/150ML IV SOLN
750.0000 mg | INTRAVENOUS | Status: DC
  Administered 2020-03-29: 13:00:00 750 mg via INTRAVENOUS
  Filled 2020-03-29 (×2): qty 150

## 2020-03-29 MED ORDER — DOXYCYCLINE HYCLATE 100 MG PO TABS
100.0000 mg | ORAL_TABLET | Freq: Two times a day (BID) | ORAL | Status: DC
  Administered 2020-03-29: 20:00:00 100 mg via ORAL
  Filled 2020-03-29: qty 1

## 2020-03-29 NOTE — Progress Notes (Signed)
PROGRESS NOTE    Kim [name]  [id number] DOB: 12/07/1955 DOA: 03/24/2020 PCP: [name], NP    Chief Complaint  Patient presents with  . Shortness of Breath    Brief Narrative:   64 year old female with history of COPD with chronic respiratory failure on steroids, hyperlipidemia, alpha-1 antitrypsin deficiency carrier, anxiety, depression who recently received pneumonia vaccine 3 days ago, also vaccinated against COVID-19 came to ED with complaints of sudden onset shortness of breath.  Patient is on oxygen as needed usually 1 ambulating long distances at 1 L/min.  Lately has been using 3 to 4 L/min since her symptom onset.  She went to [place] last week which was 5 hours drive back and forth.  Upon EMS arrival she was noted to have bilateral wheezing with SPO2 89% on room air and placed on 2 L/min of oxygen via nasal cannula. Subjective:   She feel worse, significant dyspnea, sob needing continuous oxygen supplement , denies chest pain Reports not at baseline, ( baseline she only need o2 at night)  Assessment & Plan:   Principal Problem:   COPD exacerbation (HCC) Active Problems:   Anxiety and depression   Chronic diastolic CHF (congestive heart failure) (HCC)   Essential hypertension  COPD exacerbation/acute on chronic hypoxic respiratory failure -CTA on presentation no PE, does show severe centrilobular emphysema with mild superimposed diffuse groundglass opacity which is chronic -Influenza and COVID-19 screening negative -not improving, increase  Solu-Medrol back to  60 mg every 6 hours, continue DuoNeb, Mucinex, doxycycline, add levaquin, awaiting sputum culture, consider pulm consult if no improvement tomorrow, reports her pulmonologist is Dr Elsworth Soho -Wean oxygen as tolerated  Leukocytosis -slowly  improving   Hypertension Blood pressure soft on presentation.  Home medication losartan held Blood pressure start to trend up, resume losartan   History of  Takotsubo cardiomyopathy: TTE 07/11/2019 EF 60-65%, no LV wall motion abnormalities, grade 1 diastolic dysfunction Take Lasix as needed at home, currently no edema on exam  Anxiety/depression-continue Paxil   Unchanged scattered pulmonary nodules  New mild compression fracture deformity of L1 since October of 2021   DVT prophylaxis: enoxaparin (LOVENOX) injection 40 mg Start: 03/24/20 2200   Code Status:DNR Family Communication: Patient Disposition:   Status is: Inpatient   Dispo: The patient is from: Home              Anticipated d/c is to: Home              Anticipated d/c date is: 24 to 48 hours depends on respiratory status                Consultants:   None  Procedures:   None  Antimicrobials:   Doxycycline fr om admission  Levaquin from December 23     Objective: Vitals:   03/29/20 0557 03/29/20 0859 03/29/20 1326 03/29/20 1427  BP: (!) 176/89  133/73   Pulse: 83  92   Resp: 14  16   Temp:   (!) 97.3 F (36.3 C)   TempSrc:   Oral   SpO2: 95% 96% 93% 93%  Weight:      Height:        Intake/Output Summary (Last 24 hours) at 03/29/2020 1459 Last data filed at 03/29/2020 1400 Gross per 24 hour  Intake 852.88 ml  Output --  Net 852.88 ml   Filed Weights   03/24/20 0939 03/25/20 1129  Weight: 59 kg 60.2 kg    Examination:  General exam: Dyspnea  Respiratory system: Tight, with minimal wheezing.  Dyspnea Cardiovascular system: S1 & S2 heard, RRR. No JVD, no murmur, No pedal edema. Gastrointestinal system: Abdomen is nondistended, soft and nontender. Normal bowel sounds heard. Central nervous system: Alert and oriented. No focal neurological deficits. Extremities: Symmetric 5 x 5 power. Skin: No rashes, lesions or ulcers Psychiatry: Judgement and insight appear normal. Mood & affect appropriate.     Data Reviewed: I have personally reviewed following labs and imaging studies  CBC: Recent Labs  Lab 03/24/20 0939 03/25/20 0507  03/26/20 0431 03/27/20 0448 03/28/20 0525 03/29/20 0527  WBC 16.8* 17.3* 23.8* 19.5* 15.8* 14.3*  NEUTROABS 10.8*  --   --   --   --   --   HGB 13.1 12.5 12.4 12.9 13.4 13.5  HCT 40.6 39.2 39.0 40.6 41.1 41.8  MCV 95.1 97.3 97.7 98.8 96.0 95.7  PLT 297 291 300 321 323 [id number]    Basic Metabolic Panel: Recent Labs  Lab 03/25/20 0507 03/26/20 0431 03/27/20 0448 03/28/20 0525 03/29/20 0527  NA 141 143 140 141 143  K 3.9 4.2 4.0 3.7 4.6  CL 103 104 104 98 99  CO2 26 29 29 31  33*  GLUCOSE 197* 174* 131* 130* 134*  BUN 19 30* 30* 39* 32*  CREATININE 0.71 0.89 0.76 0.80 0.82  CALCIUM 9.2 9.3 9.2 9.1 9.1    GFR: Estimated Creatinine Clearance: 55.8 mL/min (by C-G formula based on SCr of 0.82 mg/dL).  Liver Function Tests: No results for input(s): AST, ALT, ALKPHOS, BILITOT, PROT, ALBUMIN in the last 168 hours.  CBG: No results for input(s): GLUCAP in the last 168 hours.   Recent Results (from the past 240 hour(s))  Resp Panel by RT-PCR (Flu A&B, Covid) Nasopharyngeal Swab     Status: None   Collection Time: 03/24/20  9:50 AM   Specimen: Nasopharyngeal Swab; Nasopharyngeal(NP) swabs in vial transport medium  Result Value Ref Range Status   SARS Coronavirus 2 by RT PCR NEGATIVE NEGATIVE Final    Comment: (NOTE) SARS-CoV-2 target nucleic acids are NOT DETECTED.  The SARS-CoV-2 RNA is generally detectable in upper respiratory specimens during the acute phase of infection. The lowest concentration of SARS-CoV-2 viral copies this assay can detect is 138 copies/mL. A negative result does not preclude SARS-Cov-2 infection and should not be used as the sole basis for treatment or other patient management decisions. A negative result may occur with  improper specimen collection/handling, submission of specimen other than nasopharyngeal swab, presence of viral mutation(s) within the areas targeted by this assay, and inadequate number of viral copies(<138 copies/mL). A negative  result must be combined with clinical observations, patient history, and epidemiological information. The expected result is Negative.  Fact Sheet for Patients:  EntrepreneurPulse.com.au  Fact Sheet for Healthcare Providers:  IncredibleEmployment.be  This test is no t yet approved or cleared by the [place] FDA and  has been authorized for detection and/or diagnosis of SARS-CoV-2 by FDA under an Emergency Use Authorization (EUA). This EUA will remain  in effect (meaning this test can be used) for the duration of the COVID-19 declaration under Section 564(b)(1) of the Act, 21 U.S.C.section 360bbb-3(b)(1), unless the authorization is terminated  or revoked sooner.       Influenza A by PCR NEGATIVE NEGATIVE Final   Influenza B by PCR NEGATIVE NEGATIVE Final    Comment: (NOTE) The Xpert Xpress SARS-CoV-2/FLU/RSV plus assay is intended as an aid in the diagnosis of influenza from Nasopharyngeal swab specimens  and should not be used as a sole basis for treatment. Nasal washings and aspirates are unacceptable for Xpert Xpress SARS-CoV-2/FLU/RSV testing.  Fact Sheet for Patients: EntrepreneurPulse.com.au  Fact Sheet for Healthcare Providers: IncredibleEmployment.be  This test is not yet approved or cleared by the [place] FDA and has been authorized for detection and/or diagnosis of SARS-CoV-2 by FDA under an Emergency Use Authorization (EUA). This EUA will remain in effect (meaning this test can be used) for the duration of the COVID-19 declaration under Section 564(b)(1) of the Act, 21 U.S.C. section 360bbb-3(b)(1), unless the authorization is terminated or revoked.  Performed at [facility], [place] [address] [place], [place] [address]   Expectorated sputum assessment w rflx to resp cult     Status: None   Collection Time: 03/28/20  3:39 PM   Specimen: Expectorated Sputum   Result Value Ref Range Status   Specimen Description EXPECTORATED SPUTUM  Final   Special Requests NONE  Final   Sputum evaluation   Final    THIS SPECIMEN IS ACCEPTABLE FOR SPUTUM CULTURE Performed at [facility], [place] [address] [place], [place] [address]    Report Status 03/28/2020 FINAL  Final  Culture, respiratory     Status: None (Preliminary result)   Collection Time: 03/28/20  3:39 PM  Result Value Ref Range Status   Specimen Description   Final    EXPECTORATED SPUTUM Performed at [place] [address], [place], [place] [address]    Special Requests   Final    NONE Reflexed from [id number] Performed at [facility], [place] [address], [place], [place] [address]    Gram Stain   Final    FEW WBC PRESENT, PREDOMINANTLY PMN RARE SQUAMOUS EPITHELIAL CELLS PRESENT ABUNDANT GRAM POSITIVE COCCI RARE YEAST Performed at [facility], [place] [address] [place], [place] [address]    Culture PENDING  Incomplete   Report Status PENDING  Incomplete  MRSA PCR Screening     Status: None   Collection Time: 03/29/20 12:26 PM   Specimen: Nasopharyngeal  Result Value Ref Range Status   MRSA by PCR NEGATIVE NEGATIVE Final    Comment:        The GeneXpert MRSA Assay (FDA approved for NASAL specimens only), is one component of a comprehensive MRSA colonization surveillance program. It is not intended to diagnose MRSA infection nor to guide or monitor treatment for MRSA infections. Performed at [facility], [place] [address], [place], [place] [address]          Radiology Studies: No results found.      Scheduled Meds: . aspirin EC  81 mg Oral Daily  . budesonide (PULMICORT) nebulizer solution  0.25 mg Nebulization BID  . doxycycline  100 mg Oral Q12H  . enoxaparin (LOVENOX) injection  40 mg Subcutaneous Q24H  . guaiFENesin  1,200 mg Oral BID  . ibuprofen  400 mg Oral Once  .  ipratropium-albuterol  3 mL Nebulization Q6H  . losartan  25 mg Oral Daily  . methylPREDNISolone (SOLU-MEDROL) injection  60 mg Intravenous Q6H  . PARoxetine  30 mg Oral Daily   Continuous Infusions: . sodium chloride Stopped (03/24/20 1625)  . levofloxacin (LEVAQUIN) IV 750 mg (03/29/20 1240)     LOS: 5 days   Time spent: 25 mins Greater than 50% of this time was spent in counseling, explanation of diagnosis, planning of further management, and coordination of care.  I have  personally reviewed and interpreted on  03/29/2020 daily labs,I reviewed all nursing notes, pharmacy notes,  vitals, pertinent old records  I have discussed plan of care as described above with RN , patient  on 03/29/2020  Voice Recognition /Dragon dictation system was used to create this note, attempts have been made to correct errors. Please contact the author with questions and/or clarifications.   Florencia Reasons, MD PhD FACP [facility]  Available via Epic secure chat 7am-7pm for nonurgent issues Please page for urgent issues To page the attending provider between 7A-7P or the covering provider during after hours 7P-7A, please log into the web site [email or web address] and access using universal [place] password for that web site. If you do not have the password, please call the hospital operator.    03/29/2020, 2:59 PM

## 2020-03-30 LAB — CBC WITH DIFFERENTIAL/PLATELET
Abs Immature Granulocytes: 0.54 10*3/uL — ABNORMAL HIGH (ref 0.00–0.07)
Basophils Absolute: 0 10*3/uL (ref 0.0–0.1)
Basophils Relative: 0 %
Eosinophils Absolute: 0 10*3/uL (ref 0.0–0.5)
Eosinophils Relative: 0 %
HCT: 41.6 % (ref 36.0–46.0)
Hemoglobin: 13.4 g/dL (ref 12.0–15.0)
Immature Granulocytes: 4 %
Lymphocytes Relative: 5 %
Lymphs Abs: 0.8 10*3/uL (ref 0.7–4.0)
MCH: 30.7 pg (ref 26.0–34.0)
MCHC: 32.2 g/dL (ref 30.0–36.0)
MCV: 95.2 fL (ref 80.0–100.0)
Monocytes Absolute: 0.6 10*3/uL (ref 0.1–1.0)
Monocytes Relative: 4 %
Neutro Abs: 13.4 10*3/uL — ABNORMAL HIGH (ref 1.7–7.7)
Neutrophils Relative %: 87 %
Platelets: 337 10*3/uL (ref 150–400)
RBC: 4.37 MIL/uL (ref 3.87–5.11)
RDW: 14.1 % (ref 11.5–15.5)
WBC: 15.3 10*3/uL — ABNORMAL HIGH (ref 4.0–10.5)
nRBC: 0 % (ref 0.0–0.2)

## 2020-03-30 LAB — COMPREHENSIVE METABOLIC PANEL
ALT: 28 U/L (ref 0–44)
AST: 20 U/L (ref 15–41)
Albumin: 3.4 g/dL — ABNORMAL LOW (ref 3.5–5.0)
Alkaline Phosphatase: 48 U/L (ref 38–126)
Anion gap: 11 (ref 5–15)
BUN: 36 mg/dL — ABNORMAL HIGH (ref 8–23)
CO2: 31 mmol/L (ref 22–32)
Calcium: 8.9 mg/dL (ref 8.9–10.3)
Chloride: 99 mmol/L (ref 98–111)
Creatinine, Ser: 0.78 mg/dL (ref 0.44–1.00)
GFR, Estimated: >60 mL/min (ref >60)
Glucose, Bld: 153 mg/dL — ABNORMAL HIGH (ref 70–99)
Potassium: 4.7 mmol/L (ref 3.5–5.1)
Sodium: 141 mmol/L (ref 135–145)
Total Bilirubin: 0.3 mg/dL (ref 0.3–1.2)
Total Protein: 5.7 g/dL — ABNORMAL LOW (ref 6.5–8.1)

## 2020-03-30 LAB — LACTIC ACID, PLASMA: Lactic Acid, Venous: 3.1 mmol/L (ref 0.5–1.9)

## 2020-03-30 LAB — MAGNESIUM: Magnesium: 2.5 mg/dL — ABNORMAL HIGH (ref 1.7–2.4)

## 2020-03-30 MED ORDER — IPRATROPIUM-ALBUTEROL 0.5-2.5 (3) MG/3ML IN SOLN
3.0000 mL | Freq: Four times a day (QID) | RESPIRATORY_TRACT | Status: DC | PRN
  Administered 2020-03-31 (×2): 3 mL via RESPIRATORY_TRACT
  Filled 2020-03-30: qty 3

## 2020-03-30 MED ORDER — BUDESONIDE 0.5 MG/2ML IN SUSP
0.5000 mg | Freq: Two times a day (BID) | RESPIRATORY_TRACT | Status: DC
  Administered 2020-03-30 – 2020-04-01 (×4): 0.5 mg via RESPIRATORY_TRACT
  Filled 2020-03-30 (×4): qty 2

## 2020-03-30 MED ORDER — MORPHINE SULFATE (PF) 2 MG/ML IV SOLN
1.0000 mg | INTRAVENOUS | Status: DC | PRN
  Administered 2020-03-30 – 2020-04-01 (×2): 1 mg via INTRAVENOUS
  Filled 2020-03-30 (×3): qty 1

## 2020-03-30 MED ORDER — ARFORMOTEROL TARTRATE 15 MCG/2ML IN NEBU
15.0000 ug | INHALATION_SOLUTION | Freq: Two times a day (BID) | RESPIRATORY_TRACT | Status: DC
  Administered 2020-03-30 – 2020-04-03 (×7): 15 ug via RESPIRATORY_TRACT
  Filled 2020-03-30 (×8): qty 2

## 2020-03-30 MED ORDER — SODIUM CHLORIDE 0.9 % IV SOLN
500.0000 mg | INTRAVENOUS | Status: DC
  Administered 2020-03-30 – 2020-04-01 (×3): 500 mg via INTRAVENOUS
  Filled 2020-03-30 (×3): qty 500

## 2020-03-30 MED ORDER — FLUCONAZOLE 100 MG PO TABS
100.0000 mg | ORAL_TABLET | Freq: Every day | ORAL | Status: AC
  Administered 2020-03-30 – 2020-04-01 (×3): 100 mg via ORAL
  Filled 2020-03-30 (×3): qty 1

## 2020-03-30 MED ORDER — FUROSEMIDE 10 MG/ML IJ SOLN
40.0000 mg | Freq: Once | INTRAMUSCULAR | Status: AC
  Administered 2020-03-30: 13:00:00 40 mg via INTRAVENOUS
  Filled 2020-03-30: qty 4

## 2020-03-30 MED ORDER — SODIUM CHLORIDE 0.9 % IV SOLN
1.0000 g | INTRAVENOUS | Status: DC
  Administered 2020-03-30 – 2020-04-01 (×3): 1 g via INTRAVENOUS
  Filled 2020-03-30: qty 10
  Filled 2020-03-30 (×3): qty 1

## 2020-03-30 MED ORDER — MOMETASONE FURO-FORMOTEROL FUM 100-5 MCG/ACT IN AERO
2.0000 | INHALATION_SPRAY | Freq: Two times a day (BID) | RESPIRATORY_TRACT | Status: DC
  Filled 2020-03-30: qty 8.8

## 2020-03-30 NOTE — Progress Notes (Signed)
PROGRESS NOTE    Kim [name]  [id number] DOB: [birth year] DOA: 03/24/2020 PCP: [name], NP    Chief Complaint  Patient presents with  . Shortness of Breath    Brief Narrative:   64 year old female with history of COPD with chronic respiratory failure on steroids, hyperlipidemia, alpha-1 antitrypsin deficiency carrier, anxiety, depression who recently received pneumonia vaccine 3 days ago, also vaccinated against COVID-19 came to ED with complaints of sudden onset shortness of breath.  Patient is on oxygen as needed usually 1 ambulating long distances at 1 L/min.  Lately has been using 3 to 4 L/min since her symptom onset.  She went to [place] last week which was 5 hours drive back and forth.  Upon EMS arrival she was noted to have bilateral wheezing with SPO2 89% on room air and placed on 2 L/min of oxygen via nasal cannula. Subjective:    significant dyspnea, sob needing continuous oxygen supplement , denies chest pain Reports not at baseline, ( baseline she only need o2 at night) C/o frontal headache since Saturday, nocro did not help  Assessment & Plan:   Principal Problem:   COPD exacerbation ([place]) Active Problems:   Anxiety and depression   Chronic diastolic CHF (congestive heart failure) (HCC)   Essential hypertension  COPD exacerbation/acute on chronic hypoxic respiratory failure -CTA on presentation no PE, does show severe centrilobular emphysema with mild superimposed diffuse groundglass opacity which is chronic -Influenza and COVID-19 screening negative -not improving, increase  Solu-Medrol back to  60 mg every 6 hours, continue DuoNeb, Mucinex, doxycycline, add levaquin, awaiting sputum culture, consult pulmonology Dr Elsworth Soho due to persistent symptom -Wean oxygen as tolerated  Leukocytosis -slowly  improving   Hypertension Blood pressure soft on presentation.  Home medication losartan held Blood pressure start to trend up, resume losartan    History of Takotsubo cardiomyopathy: TTE 07/11/2019 EF 60-65%, no LV wall motion abnormalities, grade 1 diastolic dysfunction Take Lasix as needed at home, currently no edema on exam  Anxiety/depression-continue Paxil   Unchanged scattered pulmonary nodules  New mild compression fracture deformity of L1 since October of 2021   DVT prophylaxis: enoxaparin (LOVENOX) injection 40 mg Start: 03/24/20 2200   Code Status:DNR Family Communication: Patient Disposition:   Status is: Inpatient   Dispo: The patient is from: Home              Anticipated d/c is to: Home              Anticipated d/c date is: TBD depends on respiratory status                Consultants:   Pulmonology Dr. Elsworth Soho  Procedures:   None  Antimicrobials:   Doxycycline fr om admission  Levaquin from December 23 then rocephin/zithro     Objective: Vitals:   03/29/20 1955 03/29/20 2118 03/30/20 0534 03/30/20 0558  BP:  (!) 143/78 (!) 149/79   Pulse:  88 84   Resp:  16 20   Temp:  97.7 F (36.5 C) 98 F (36.7 C)   TempSrc:  Oral Oral   SpO2: 93%  94% 94%  Weight:      Height:        Intake/Output Summary (Last 24 hours) at 03/30/2020 0744 Last data filed at 03/30/2020 [id number] Gross per 24 hour  Intake 1470.04 ml  Output 0 ml  Net 1470.04 ml   Filed Weights   03/24/20 0939 03/25/20 1129  Weight: 59 kg 60.2 kg  Examination:  General exam: Dyspnea Respiratory system: Tight, with minimal wheezing. Right lower lobe ronchi, Dyspnea Cardiovascular system: S1 & S2 heard, RRR. No JVD, no murmur, No pedal edema. Gastrointestinal system: Abdomen is nondistended, soft and nontender. Normal bowel sounds heard. Central nervous system: Alert and oriented. No focal neurological deficits. Extremities: Symmetric 5 x 5 power. Skin: No rashes, lesions or ulcers Psychiatry: Judgement and insight appear normal. Mood & affect appropriate.     Data Reviewed: I have personally reviewed following labs and  imaging studies  CBC: Recent Labs  Lab 03/24/20 0939 03/25/20 0507 03/26/20 0431 03/27/20 0448 03/28/20 0525 03/29/20 0527 03/30/20 0503  WBC 16.8*   < > 23.8* 19.5* 15.8* 14.3* 15.3*  NEUTROABS 10.8*  --   --   --   --   --  13.4*  HGB 13.1   < > 12.4 12.9 13.4 13.5 13.4  HCT 40.6   < > 39.0 40.6 41.1 41.8 41.6  MCV 95.1   < > 97.7 98.8 96.0 95.7 95.2  PLT 297   < > 300 321 323 337 337   < > = values in this interval not displayed.    Basic Metabolic Panel: Recent Labs  Lab 03/26/20 0431 03/27/20 0448 03/28/20 0525 03/29/20 0527 03/30/20 0503  NA 143 140 141 143 141  K 4.2 4.0 3.7 4.6 4.7  CL 104 104 98 99 99  CO2 29 29 31  33* 31  GLUCOSE 174* 131* 130* 134* 153*  BUN 30* 30* 39* 32* 36*  CREATININE 0.89 0.76 0.80 0.82 0.78  CALCIUM 9.3 9.2 9.1 9.1 8.9  MG  --   --   --   --  2.5*    GFR: Estimated Creatinine Clearance: 57.2 mL/min (by C-G formula based on SCr of 0.78 mg/dL).  Liver Function Tests: Recent Labs  Lab 03/30/20 0503  AST 20  ALT 28  ALKPHOS 48  BILITOT 0.3  PROT 5.7*  ALBUMIN 3.4*    CBG: No results for input(s): GLUCAP in the last 168 hours.   Recent Results (from the past 240 hour(s))  Resp Panel by RT-PCR (Flu A&B, Covid) Nasopharyngeal Swab     Status: None   Collection Time: 03/24/20  9:50 AM   Specimen: Nasopharyngeal Swab; Nasopharyngeal(NP) swabs in vial transport medium  Result Value Ref Range Status   SARS Coronavirus 2 by RT PCR NEGATIVE NEGATIVE Final    Comment: (NOTE) SARS-CoV-2 target nucleic acids are NOT DETECTED.  The SARS-CoV-2 RNA is generally detectable in upper respiratory specimens during the acute phase of infection. The lowest concentration of SARS-CoV-2 viral copies this assay can detect is 138 copies/mL. A negative result does not preclude SARS-Cov-2 infection and should not be used as the sole basis for treatment or other patient management decisions. A negative result may occur with  improper specimen  collection/handling, submission of specimen other than nasopharyngeal swab, presence of viral mutation(s) within the areas targeted by this assay, and inadequate number of viral copies(<138 copies/mL). A negative result must be combined with clinical observations, patient history, and epidemiological information. The expected result is Negative.  Fact Sheet for Patients:  BloggerCourse.com  Fact Sheet for Healthcare Providers:  SeriousBroker.it  This test is no t yet approved or cleared by the [place] FDA and  has been authorized for detection and/or diagnosis of SARS-CoV-2 by FDA under an Emergency Use Authorization (EUA). This EUA will remain  in effect (meaning this test can be used) for the duration of  the COVID-19 declaration under Section 564(b)(1) of the Act, 21 U.S.C.section 360bbb-3(b)(1), unless the authorization is terminated  or revoked sooner.       Influenza A by PCR NEGATIVE NEGATIVE Final   Influenza B by PCR NEGATIVE NEGATIVE Final    Comment: (NOTE) The Xpert Xpress SARS-CoV-2/FLU/RSV plus assay is intended as an aid in the diagnosis of influenza from Nasopharyngeal swab specimens and should not be used as a sole basis for treatment. Nasal washings and aspirates are unacceptable for Xpert Xpress SARS-CoV-2/FLU/RSV testing.  Fact Sheet for Patients: EntrepreneurPulse.com.au  Fact Sheet for Healthcare Providers: IncredibleEmployment.be  This test is not yet approved or cleared by the [place] FDA and has been authorized for detection and/or diagnosis of SARS-CoV-2 by FDA under an Emergency Use Authorization (EUA). This EUA will remain in effect (meaning this test can be used) for the duration of the COVID-19 declaration under Section 564(b)(1) of the Act, 21 U.S.C. section 360bbb-3(b)(1), unless the authorization is terminated or revoked.  Performed at [facility], [place] [address] [place], [place] [address]   Expectorated sputum assessment w rflx to resp cult     Status: None   Collection Time: 03/28/20  3:39 PM   Specimen: Expectorated Sputum  Result Value Ref Range Status   Specimen Description EXPECTORATED SPUTUM  Final   Special Requests NONE  Final   Sputum evaluation   Final    THIS SPECIMEN IS ACCEPTABLE FOR SPUTUM CULTURE Performed at [facility], [place] [address] [place], [place] [address]    Report Status 03/28/2020 FINAL  Final  Culture, respiratory     Status: None (Preliminary result)   Collection Time: 03/28/20  3:39 PM  Result Value Ref Range Status   Specimen Description   Final    EXPECTORATED SPUTUM Performed at [place] [address], [place], [place] [address]    Special Requests   Final    NONE Reflexed from [id number] Performed at [facility], [place] [address], [place], [place] [address]    Gram Stain   Final    FEW WBC PRESENT, PREDOMINANTLY PMN RARE SQUAMOUS EPITHELIAL CELLS PRESENT ABUNDANT GRAM POSITIVE COCCI RARE YEAST Performed at [facility], [place] [address], [place], [place] [address]    Culture PENDING  Incomplete   Report Status PENDING  Incomplete  MRSA PCR Screening     Status: None   Collection Time: 03/29/20 12:26 PM   Specimen: Nasopharyngeal  Result Value Ref Range Status   MRSA by PCR NEGATIVE NEGATIVE Final    Comment:        The GeneXpert MRSA Assay (FDA approved for NASAL specimens only), is one component of a comprehensive MRSA colonization surveillance program. It is not intended to diagnose MRSA infection nor to guide or monitor treatment for MRSA infections. Performed at [facility], [place] [address], [place], [place] [address]          Radiology Studies: No results found.      Scheduled Meds: . aspirin EC  81 mg Oral Daily  . budesonide (PULMICORT) nebulizer  solution  0.25 mg Nebulization BID  . doxycycline  100 mg Oral Q12H  . enoxaparin (LOVENOX) injection  40 mg Subcutaneous Q24H  . guaiFENesin  1,200 mg Oral BID  . ibuprofen  400 mg Oral Once  . ipratropium-albuterol  3 mL Nebulization Q6H  . losartan  25 mg Oral Daily  . methylPREDNISolone (SOLU-MEDROL) injection  60 mg Intravenous Q6H  . PARoxetine  30 mg Oral Daily   Continuous Infusions: . sodium chloride Stopped (03/24/20 1625)  . levofloxacin (LEVAQUIN) IV 750 mg (03/29/20 1240)     LOS: 6 days   Time spent: 25 mins Greater than 50% of this time was spent in counseling, explanation of diagnosis, planning of further management, and coordination of care.  I have personally reviewed and interpreted on  03/30/2020 daily labs,I reviewed all nursing notes, pharmacy notes,  vitals, pertinent old records  I have discussed plan of care as described above with RN , patient  on 03/30/2020  Voice Recognition /Dragon dictation system was used to create this note, attempts have been made to correct errors. Please contact the author with questions and/or clarifications.   Florencia Reasons, MD PhD FACP [facility]  Available via Epic secure chat 7am-7pm for nonurgent issues Please page for urgent issues To page the attending provider between 7A-7P or the covering provider during after hours 7P-7A, please log into the web site [email or web address] and access using universal [place] password for that web site. If you do not have the password, please call the hospital operator.    03/30/2020, 7:44 AM

## 2020-03-30 NOTE — Progress Notes (Signed)
MD  Florencia Reasons paged regarding critical lab value of lactic acid 3.1

## 2020-03-30 NOTE — Consult Note (Signed)
Name: Kim [name] MRN: [id number] DOB: 1955/08/09    ADMISSION DATE:  03/24/2020 CONSULTATION DATE:  03/30/2020   REFERRING MD :  Maryruth Bun   CHIEF COMPLAINT: Shortness of breath, nonresolving COPD flare  BRIEF PATIENT DESCRIPTION: 64 year old ex-smoker with severe COPD and multiple flares maintained on low-dose prednisone since April 2021 admitted with nonresolving COPD flare  SIGNIFICANT EVENTS  June 2020- COPD exacerbation/Prednisone + zpack September- prednisone PCP December 2020- Prednisone + zpack ? PCP January 18th 2021- COPD exacerbation/Prednisone + doxycycline  Feb 5th 2021- COPD exacerbation/Prednisone + Zpack  Feb 9th 2021- Prolonged COPD exacerbation requiring high dose prednisone/ CXR ordered  April 4th- Hospitalized for COPD exacerbation treated with IV steriods and oral prednisone taper 08/12/2019 - Zpack and extrended prednisone taper   09/2019>> low-dose steroids - tapered to off by end August.  01/2020 OV >> pred 40 mg, settling at 10 02/2020 OV >> decrease to 5 mg with the goal of getting her off steroids completely within the next 2 months  STUDIES:  CT angiogram chest 03/24/2020  Severe centrilobular emphysema with mild superimposed diffuse ground-glass opacity with an apical predominance, similar in comparison to prior study. Findings likely reflect smoking related lung disease/respiratory bronchiolitis. New mild compression fracture deformity of L1 since 10/ 2021.  11/2014: FVC 2.00 L (63%) FEV1 1.00 L (41%)ratio50 negative bronchodilator response TLC 5.06 L (103%) RV 140% DLCO uncorrected 51%  01/2020 LDCT - stable, T7 fracture  HISTORY OF PRESENT ILLNESS: She has severe COPD with FEV1 around 40%.  She quit smoking on 2 L.  She has oxygen at home to use on an as-needed basis.  She had multiple flares this year including a hospitalization 07/2019 and has been maintained on steroids since then.  Unfortunately she has a lot of osteoporosis and  vertebral fractures. At her last office visit we dropped her prednisone to 5 mg with the goal of tapering this to off she remains on Symbicort and Spiriva as maintenance medications .  She had a recent visit to [place] and [place].  After returning she suddenly developed shortness of breath and presented to the emergency room on 12/18, CT angiogram was negative for pulmonary embolism Covid and flu test were negative she denies preceding URI symptoms or fevers.  She received Pneumovax 3 days prior to admission. 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  has a past surgical history that includes Appendectomy (1969); Cholecystectomy (1989); Exploratory laparotomy (1093); collapse lung (1990); Cardiac catheterization (N/A, 07/05/2015); Abdominal hysterectomy; Salpingoophorectomy (2019); Excision mass abdominal (2019); Colonoscopy with propofol (N/A, 05/14/2018); and polypectomy (05/14/2018). Prior to Admission medications   Medication Sig Start Date End Date Taking? Authorizing Provider  albuterol (VENTOLIN HFA) 108 (90 Base) MCG/ACT inhaler Take two puffs every 4-6 hours as needed for shortness of breath/wheezing . 90 day supply Patient taking differently: Inhale 2 puffs into the lungs See admin instructions. Take two puffs every 4-6 hours as needed for shortness of breath/wheezing . 90 day supply 12/14/19  Yes Martyn Ehrich, NP  ALPRAZolam Duanne Moron) 0.5 MG tablet TAKE 1/2 TO 1 TABLET BY MOUTH THREE TIMES DAILY Patient taking differently: Take 0.25-0.5 mg by mouth 3 (three) times daily. 11/25/19  Yes Nafziger, Tommi Rumps, NP  Ascorbic Acid (VITAMIN C  PO) Take 1  tablet by mouth daily.   Yes [provider]  aspirin EC 81 MG EC tablet Take 1 tablet (81 mg total) by mouth daily. 07/08/15  Yes Nita Sells, MD  budesonide-formoterol (SYMBICORT) 160-4.5 MCG/ACT inhaler Inhale 2 puffs into the lungs 2 (two) times daily. 09/30/19  Yes Martyn Ehrich, NP  Calcium Carbonate-Vit D-Min (CALCIUM 1200 PO) Take 1 tablet by mouth daily.   Yes [provider]  Cholecalciferol (VITAMIN D3) 50 MCG (2000 UT) TABS Take 2,000 Units by mouth daily.   Yes [provider]  cyanocobalamin 2000 MCG tablet Take 2,000 mcg by mouth daily. Vitamin B12   Yes [provider]  furosemide (LASIX) 40 MG tablet Take 1 tablet (40 mg total) by mouth daily as needed (for weight gain of 3 lbs). Patient taking differently: Take 20-40 mg by mouth daily as needed (for overnight weight gain of 3 lbs 1/2 tablet (20 mg), for overnight weight gain of 5 lbs 1 tablet (40 mg)). 07/01/19  Yes Tommie Raymond, NP  Ginkgo Biloba Extract 60 MG CAPS Take 60 mg by mouth daily.   Yes [provider]  HYDROcodone-acetaminophen (NORCO) 7.5-325 MG tablet Take 1 tablet by mouth 2 (two) times daily as needed (pain/cough). Patient taking differently: Take 1 tablet by mouth 2 (two) times daily as needed for moderate pain or severe pain (pain/cough). 02/23/20  Yes Nafziger, Tommi Rumps, NP  ipratropium-albuterol (DUONEB) 0.5-2.5 (3) MG/3ML SOLN USE 3 ML VIA NEBULIZER EVERY 6 HOURS AS NEEDED Patient taking differently: Take 3 mLs by nebulization every 6 (six) hours as needed (shortness of breath / wheezing). 02/14/20  Yes [name], MD  losartan (COZAAR) 25 MG tablet TAKE 1 TABLET EVERY DAY Patient taking differently: Take 25 mg by mouth daily. 01/11/20  Yes [name], MD  magnesium oxide (MAG-OX) 400 MG tablet Take 400 mg by mouth daily.   Yes [provider]  OXYGEN Inhale 2 L into the lungs continuous.   Yes [provider]  PARoxetine  (PAXIL) 30 MG tablet Take 1 tablet (30 mg total) by mouth daily. 01/11/20  Yes Nafziger, Tommi Rumps, NP  predniSONE (DELTASONE) 5 MG tablet Take 1 tablet (5 mg total) by mouth daily with breakfast. December 5 mg daily.  January 1st decrease to 5 mg every other day. 03/05/20  Yes [name], MD  Spacer/Aero Chamber Mouthpiece MISC 1 Device by Does not apply route as directed. 01/12/17  Yes Javier Glazier, MD  Tiotropium Bromide Monohydrate (SPIRIVA RESPIMAT) 2.5 MCG/ACT AERS USE 2 INHALATIONS BY MOUTH  DAILY Patient taking differently: Inhale 2 puffs into the lungs daily at 12 noon. 03/29/19  Yes Nafziger, Tommi Rumps, NP  nitroGLYCERIN (NITROSTAT) 0.4 MG SL tablet Place 1 tablet (0.4 mg total) under the tongue every 5 (five) minutes as needed for chest pain. 08/09/18   Consuelo Pandy, PA-C   Allergies  Allergen Reactions  . Ciprofloxacin Other (See Comments)    Hallucinations   . Lisinopril Cough  . Penicillins Hives    Tolerates Omnicef Has patient had a PCN reaction causing immediate rash, facial/tongue/throat swelling, SOB or lightheadedness with hypotension: Yes Has patient had a PCN reaction causing severe rash involving mucus membranes or skin necrosis: No Has patient had a PCN reaction that required hospitalization No Has patient had a PCN reaction occurring within the last 10 years: No If all of the above answers are "NO", then may proceed with Cephalosporin use.  FAMILY HISTORY:  family history includes Alcohol abuse in an other family member; Arthritis in an other family member; Cancer in her mother and another family member; Diabetes in her sister and another family member; Heart attack in her maternal uncle, mother, and other family members; Heart disease in an other family member; Hypertension in her brother and sister; Kidney disease in an other family member; Melanoma in an other family member; Multiple sclerosis in her brother and another family member; Rheum arthritis in her  sister; Stroke in her maternal grandmother and another family member; Thyroid disease in her sister. SOCIAL HISTORY:  reports that she quit smoking about 6 years ago. Her smoking use included cigarettes. She has a 76.00 pack-year smoking history. She has never used smokeless tobacco. She reports that she does not drink alcohol and does not use drugs.  REVIEW OF SYSTEMS:   +Shortness of breath, wheezing, minimal sputum   Constitutional: Negative for fever, chills, weight loss, malaise/fatigue and diaphoresis.  HENT: Negative for hearing loss, ear pain, nosebleeds, congestion, sore throat, neck pain, tinnitus and ear discharge.   Eyes: Negative for blurred vision, double vision, photophobia, pain, discharge and redness.   Cardiovascular: Negative for chest pain, palpitations, orthopnea, claudication, leg swelling and PND.  Gastrointestinal: Negative for heartburn, nausea, vomiting, abdominal pain, diarrhea, constipation, blood in stool and melena.  Genitourinary: Negative for dysuria, urgency, frequency, hematuria and flank pain.  Musculoskeletal: Negative for myalgias, back pain, joint pain and falls.  Skin: Negative for itching and rash.  Neurological: Negative for dizziness, tingling, tremors, sensory change, speech change, focal weakness, seizures, loss of consciousness, weakness and headaches.  Endo/Heme/Allergies: Negative for environmental allergies and polydipsia. Does not bruise/bleed easily.  SUBJECTIVE:   VITAL SIGNS: Temp:  [97.7 F (36.5 C)-98 F (36.7 C)] 98 F (36.7 C) (12/24 0534) Pulse Rate:  [84-88] 84 (12/24 0534) Resp:  [16-20] 20 (12/24 0534) BP: (143-149)/(78-79) 149/79 (12/24 0534) SpO2:  [93 %-96 %] 94 % (12/24 1425)  PHYSICAL EXAMINATION: General: Thin elderly woman, mild distress receiving neb Neuro: Alert, interactive, anxious affect HEENT: No pallor, icterus, no JVD, oral thrush Cardiovascular: S1-S2 regular, tacky after neb Lungs: Mild accessory muscle  use, bilateral scattered rhonchi Abdomen: Soft, nontender Musculoskeletal: No deformity, no edema Skin: No rash  Recent Labs  Lab 03/28/20 0525 03/29/20 0527 03/30/20 0503  NA 141 143 141  K 3.7 4.6 4.7  CL 98 99 99  CO2 31 33* 31  BUN 39* 32* 36*  CREATININE 0.80 0.82 0.78  GLUCOSE 130* 134* 153*   Recent Labs  Lab 03/28/20 0525 03/29/20 0527 03/30/20 0503  HGB 13.4 13.5 13.4  HCT 41.1 41.8 41.6  WBC 15.8* 14.3* 15.3*  PLT 323 337 337   No results found.  ASSESSMENT / PLAN:  COPD exacerbation, nonresolving -Etiology not clear, this does not appear to be a classic infective exacerbation she had no preceding URI symptoms and no environmental exposure. -Sputum  culture is pending, GPC's are indicated -I am not convinced there is significant ILD on CT imaging -She has bronchospasm and thrush on exam today  Unfortunately she has developed osteoporosis and vertebral fractures but we do not have any alternative to steroids when her breathing is this bad  Recommend -Agree with IV Solu-Medrol 60 every 6 -Instead of Dulera, will switch to Pulmicort and Brovana nebs -DuoNebs can be continued every 6 -Would continue antibiotics and follow sputum culture -We will treat for thrush with Diflucan for 3 days   Kara Mead  MD. FCCP. [facility] See Amion for pager  If no response to pager , please call [phone]  After 7:00 pm call Elink  [phone]    03/30/2020, 2:57 PM

## 2020-03-30 NOTE — Care Management Important Message (Signed)
Important Message  Patient Details IM Letter given to the Patient Name: Kim [name] MRN: [id number] Date of Birth: 1955/06/30   Medicare Important Message Given:  Yes     Kerin Salen 03/30/2020, 1:15 PM

## 2020-03-31 ENCOUNTER — Encounter (HOSPITAL_COMMUNITY): Payer: Self-pay | Admitting: Internal Medicine

## 2020-03-31 LAB — BASIC METABOLIC PANEL
Anion gap: 14 (ref 5–15)
BUN: 38 mg/dL — ABNORMAL HIGH (ref 8–23)
CO2: 34 mmol/L — ABNORMAL HIGH (ref 22–32)
Calcium: 9.2 mg/dL (ref 8.9–10.3)
Chloride: 94 mmol/L — ABNORMAL LOW (ref 98–111)
Creatinine, Ser: 0.86 mg/dL (ref 0.44–1.00)
GFR, Estimated: >60 mL/min (ref >60)
Glucose, Bld: 161 mg/dL — ABNORMAL HIGH (ref 70–99)
Potassium: 4.6 mmol/L (ref 3.5–5.1)
Sodium: 142 mmol/L (ref 135–145)

## 2020-03-31 LAB — CULTURE, RESPIRATORY W GRAM STAIN: Culture: NORMAL

## 2020-03-31 LAB — LACTIC ACID, PLASMA: Lactic Acid, Venous: 3.6 mmol/L (ref 0.5–1.9)

## 2020-03-31 MED ORDER — IPRATROPIUM-ALBUTEROL 0.5-2.5 (3) MG/3ML IN SOLN
3.0000 mL | Freq: Three times a day (TID) | RESPIRATORY_TRACT | Status: DC
  Administered 2020-03-31 – 2020-04-03 (×8): 3 mL via RESPIRATORY_TRACT
  Filled 2020-03-31 (×8): qty 3

## 2020-03-31 MED ORDER — IPRATROPIUM-ALBUTEROL 0.5-2.5 (3) MG/3ML IN SOLN: 3.0000 mL | RESPIRATORY_TRACT | Status: DC | PRN

## 2020-03-31 NOTE — Progress Notes (Signed)
Name: Kim [name] MRN: [id number] DOB: 1955/05/14    ADMISSION DATE:  03/24/2020 CONSULTATION DATE:  03/31/2020   REFERRING MD :  Maryruth Bun   CHIEF COMPLAINT: Shortness of breath, nonresolving COPD flare  BRIEF PATIENT DESCRIPTION: 64 year old ex-smoker with severe COPD and multiple flares maintained on low-dose prednisone since April 2021 admitted with nonresolving COPD flare  SIGNIFICANT EVENTS  June 2020- COPD exacerbation/Prednisone + zpack September- prednisone PCP December 2020- Prednisone + zpack ? PCP January 18th 2021- COPD exacerbation/Prednisone + doxycycline  Feb 5th 2021- COPD exacerbation/Prednisone + Zpack  Feb 9th 2021- Prolonged COPD exacerbation requiring high dose prednisone/ CXR ordered  April 4th- Hospitalized for COPD exacerbation treated with IV steriods and oral prednisone taper 08/12/2019 - Zpack and extrended prednisone taper   09/2019>> low-dose steroids - tapered to off by end August.  01/2020 OV >> pred 40 mg, settling at 10 02/2020 OV >> decrease to 5 mg with the goal of getting her off steroids completely within the next 2 months  STUDIES:  CT angiogram chest 03/24/2020  Severe centrilobular emphysema with mild superimposed diffuse ground-glass opacity with an apical predominance, similar in comparison to prior study. Findings likely reflect smoking related lung disease/respiratory bronchiolitis. New mild compression fracture deformity of L1 since 10/ 2021.  11/2014: FVC 2.00 L (63%) FEV1 1.00 L (41%)ratio50 negative bronchodilator response TLC 5.06 L (103%) RV 140% DLCO uncorrected 51%  01/2020 LDCT - stable, T7 fracture  HISTORY OF PRESENT ILLNESS: She has severe COPD with FEV1 around 40%.  She quit smoking on 2 L.  She has oxygen at home to use on an as-needed basis.  She had multiple flares this year including a hospitalization 07/2019 and has been maintained on steroids since then.  Unfortunately she has a lot of osteoporosis and  vertebral fractures. At her last office visit we dropped her prednisone to 5 mg with the goal of tapering this to off she remains on Symbicort and Spiriva as maintenance medications .  She had a recent visit to [place] and [place].  After returning she suddenly developed shortness of breath and presented to the emergency room on 12/18, CT angiogram was negative for pulmonary embolism Covid and flu test were negative she denies preceding URI symptoms or fevers.  She received Pneumovax 3 days prior to admission. She improved somewhat initially but when steroids were decreased, breathing worsen hence we are consulted  SUBJECTIVE:   VITAL SIGNS: Temp:  [97.5 F (36.4 C)-98.2 F (36.8 C)] 97.5 F (36.4 C) (12/25 1358) Pulse Rate:  [73-97] 92 (12/25 1358) Resp:  [18-20] 20 (12/25 1358) BP: (137-159)/(82-92) 140/85 (12/25 1358) SpO2:  [90 %-96 %] 96 % (12/25 1358)  PHYSICAL EXAMINATION: Gen. Pleasant, thin elderly woman, in no distress ENT - no thrush, no pallor/icterus,no post nasal drip Neck: No JVD, no thyromegaly, no carotid bruits Lungs: no use of accessory muscles, no dullness to percussion, decreased without rales or rhonchi  Cardiovascular: Rhythm regular, heart sounds  normal, no murmurs or gallops, no peripheral edema Musculoskeletal: No deformities, no cyanosis or clubbing    Recent Labs  Lab 03/29/20 0527 03/30/20 0503 03/31/20 0506  NA 143 141 142  K 4.6 4.7 4.6  CL 99 99 94*  CO2 33* 31 34*  BUN 32* 36* 38*  CREATININE 0.82 0.78 0.86  GLUCOSE 134* 153* 161*   Recent Labs  Lab 03/28/20 0525 03/29/20 0527 03/30/20 0503  HGB 13.4 13.5 13.4  HCT 41.1 41.8 41.6  WBC 15.8* 14.3*  15.3*  PLT 323 337 337   No results found.  ASSESSMENT / PLAN:  COPD exacerbation, nonresolving -Etiology not clear, this does not appear to be a classic infective exacerbation she had no preceding URI symptoms and no environmental exposure. -Sputum  culture is neg -No significant  ILD on CT imaging   Recommend -continue IV Solu-Medrol 60 every 6 -Switched to Pulmicort and Brovana nebs -DuoNebs  every 6h prn -ceftx /azithro x 5ds   Oral thrush - Diflucan for 3 days Osteoporosis /vertebral fractures- have to ask PCP to start biphosphonates   Kara Mead MD. Shade Flood. [facility] See Amion for pager  If no response to pager , please call [phone]  After 7:00 pm call Elink  [phone]    03/31/2020, 2:47 PM

## 2020-03-31 NOTE — Progress Notes (Signed)
PROGRESS NOTE    Kim [name]  [id number] DOB: 1955-11-06 DOA: 03/24/2020 PCP: [name], NP    Chief Complaint  Patient presents with   Shortness of Breath    Brief Narrative:   64 year old female with history of COPD with chronic respiratory failure on steroids, hyperlipidemia, alpha-1 antitrypsin deficiency carrier, anxiety, depression who recently received pneumonia vaccine 3 days ago, also vaccinated against COVID-19 came to ED with complaints of sudden onset shortness of breath.  Patient is on oxygen as needed usually 1 ambulating long distances at 1 L/min.  Lately has been using 3 to 4 L/min since her symptom onset.  She went to [place] last week which was 5 hours drive back and forth.  Upon EMS arrival she was noted to have bilateral wheezing with SPO2 89% on room air and placed on 2 L/min of oxygen via nasal cannula. Subjective:  Remain the same, no improvement  + dyspnea, sob needing continuous oxygen supplement , denies chest pain   Assessment & Plan:   Principal Problem:   COPD exacerbation (HCC) Active Problems:   Anxiety and depression   Chronic diastolic CHF (congestive heart failure) (HCC)   Essential hypertension  COPD exacerbation/acute on chronic hypoxic respiratory failure -CTA on presentation no PE, does show severe centrilobular emphysema with mild superimposed diffuse groundglass opacity which is chronic -Influenza and COVID-19 screening negative, sputum culture normal flora -not improving, increased  Solu-Medrol back to  60 mg every 6 hours, continue DuoNeb, Mucinex, antibiotics  - pulmonology Dr Elsworth Soho consulted due to persistent symptom, will follow recommendation  Leukocytosis -slowly  improving   Hypertension Blood pressure soft on presentation.  Home medication losartan held  resumed losartan   History of Takotsubo cardiomyopathy: TTE 07/11/2019 EF 60-65%, no LV wall motion abnormalities, grade 1 diastolic dysfunction Take Lasix  as needed at home, currently no edema on exam  Anxiety/depression-continue Paxil   Unchanged scattered pulmonary nodules  New mild compression fracture deformity of L1 since October of 2021   DVT prophylaxis: Place and maintain sequential compression device Start: 03/31/20 1345 enoxaparin (LOVENOX) injection 40 mg Start: 03/24/20 2200   Code Status:DNR Family Communication: Patient Disposition:   Status is: Inpatient   Dispo: The patient is from: Home              Anticipated d/c is to: Home              Anticipated d/c date is: TBD depends on respiratory status                Consultants:   Pulmonology Dr. Elsworth Soho  Procedures:   None  Antimicrobials:   Doxycycline fr om admission  Levaquin from December 23 then rocephin/zithro     Objective: Vitals:   03/31/20 0608 03/31/20 0700 03/31/20 1107 03/31/20 1358  BP: (!) 151/84 (!) 159/85  140/85  Pulse: 73 85  92  Resp: 18 18  20   Temp: 97.8 F (36.6 C) 98.2 F (36.8 C)  (!) 97.5 F (36.4 C)  TempSrc: Oral Oral  Oral  SpO2: 92% 92% 90% 96%  Weight:      Height:        Intake/Output Summary (Last 24 hours) at 03/31/2020 1446 Last data filed at 03/31/2020 0819 Gross per 24 hour  Intake 859.5 ml  Output 5 ml  Net 854.5 ml   Filed Weights   03/24/20 0939 03/25/20 1129  Weight: 59 kg 60.2 kg    Examination:  General exam: Dyspnea Respiratory  system: Tight, with minimal wheezing. Right lower lobe ronchi, Dyspnea Cardiovascular system: S1 & S2 heard, RRR. No JVD, no murmur, No pedal edema. Gastrointestinal system: Abdomen is nondistended, soft and nontender. Normal bowel sounds heard. Central nervous system: Alert and oriented. No focal neurological deficits. Extremities: Symmetric 5 x 5 power. Skin: No rashes, lesions or ulcers Psychiatry: Judgement and insight appear normal. Mood & affect appropriate.     Data Reviewed: I have personally reviewed following labs and imaging studies  CBC: Recent  Labs  Lab 03/26/20 0431 03/27/20 0448 03/28/20 0525 03/29/20 0527 03/30/20 0503  WBC 23.8* 19.5* 15.8* 14.3* 15.3*  NEUTROABS  --   --   --   --  13.4*  HGB 12.4 12.9 13.4 13.5 13.4  HCT 39.0 40.6 41.1 41.8 41.6  MCV 97.7 98.8 96.0 95.7 95.2  PLT 300 321 323 337 [id number]    Basic Metabolic Panel: Recent Labs  Lab 03/27/20 0448 03/28/20 0525 03/29/20 0527 03/30/20 0503 03/31/20 0506  NA 140 141 143 141 142  K 4.0 3.7 4.6 4.7 4.6  CL 104 98 99 99 94*  CO2 29 31 33* 31 34*  GLUCOSE 131* 130* 134* 153* 161*  BUN 30* 39* 32* 36* 38*  CREATININE 0.76 0.80 0.82 0.78 0.86  CALCIUM 9.2 9.1 9.1 8.9 9.2  MG  --   --   --  2.5*  --     GFR: Estimated Creatinine Clearance: 53.2 mL/min (by C-G formula based on SCr of 0.86 mg/dL).  Liver Function Tests: Recent Labs  Lab 03/30/20 0503  AST 20  ALT 28  ALKPHOS 48  BILITOT 0.3  PROT 5.7*  ALBUMIN 3.4*    CBG: No results for input(s): GLUCAP in the last 168 hours.   Recent Results (from the past 240 hour(s))  Resp Panel by RT-PCR (Flu A&B, Covid) Nasopharyngeal Swab     Status: None   Collection Time: 03/24/20  9:50 AM   Specimen: Nasopharyngeal Swab; Nasopharyngeal(NP) swabs in vial transport medium  Result Value Ref Range Status   SARS Coronavirus 2 by RT PCR NEGATIVE NEGATIVE Final    Comment: (NOTE) SARS-CoV-2 target nucleic acids are NOT DETECTED.  The SARS-CoV-2 RNA is generally detectable in upper respiratory specimens during the acute phase of infection. The lowest concentration of SARS-CoV-2 viral copies this assay can detect is 138 copies/mL. A negative result does not preclude SARS-Cov-2 infection and should not be used as the sole basis for treatment or other patient management decisions. A negative result may occur with  improper specimen collection/handling, submission of specimen other than nasopharyngeal swab, presence of viral mutation(s) within the areas targeted by this assay, and inadequate number of  viral copies(<138 copies/mL). A negative result must be combined with clinical observations, patient history, and epidemiological information. The expected result is Negative.  Fact Sheet for Patients:  EntrepreneurPulse.com.au  Fact Sheet for Healthcare Providers:  IncredibleEmployment.be  This test is no t yet approved or cleared by the [place] FDA and  has been authorized for detection and/or diagnosis of SARS-CoV-2 by FDA under an Emergency Use Authorization (EUA). This EUA will remain  in effect (meaning this test can be used) for the duration of the COVID-19 declaration under Section 564(b)(1) of the Act, 21 U.S.C.section 360bbb-3(b)(1), unless the authorization is terminated  or revoked sooner.       Influenza A by PCR NEGATIVE NEGATIVE Final   Influenza B by PCR NEGATIVE NEGATIVE Final    Comment: (NOTE) The Xpert  Xpress SARS-CoV-2/FLU/RSV plus assay is intended as an aid in the diagnosis of influenza from Nasopharyngeal swab specimens and should not be used as a sole basis for treatment. Nasal washings and aspirates are unacceptable for Xpert Xpress SARS-CoV-2/FLU/RSV testing.  Fact Sheet for Patients: EntrepreneurPulse.com.au  Fact Sheet for Healthcare Providers: IncredibleEmployment.be  This test is not yet approved or cleared by the [place] FDA and has been authorized for detection and/or diagnosis of SARS-CoV-2 by FDA under an Emergency Use Authorization (EUA). This EUA will remain in effect (meaning this test can be used) for the duration of the COVID-19 declaration under Section 564(b)(1) of the Act, 21 U.S.C. section 360bbb-3(b)(1), unless the authorization is terminated or revoked.  Performed at [facility], [place] [address], [place], [place] [address]   Expectorated sputum assessment w rflx to resp cult     Status: None   Collection Time: 03/28/20  3:39  PM   Specimen: Expectorated Sputum  Result Value Ref Range Status   Specimen Description EXPECTORATED SPUTUM  Final   Special Requests NONE  Final   Sputum evaluation   Final    THIS SPECIMEN IS ACCEPTABLE FOR SPUTUM CULTURE Performed at [facility], [place] [address], [place], [place] [address]    Report Status 03/28/2020 FINAL  Final  Culture, respiratory     Status: None   Collection Time: 03/28/20  3:39 PM  Result Value Ref Range Status   Specimen Description   Final    EXPECTORATED SPUTUM Performed at [facility] [address] [place], [place] [address]    Special Requests   Final    NONE Reflexed from [id number] Performed at [facility], [place] [address] [place], [place] [address]    Gram Stain   Final    FEW WBC PRESENT, PREDOMINANTLY PMN RARE SQUAMOUS EPITHELIAL CELLS PRESENT ABUNDANT GRAM POSITIVE COCCI RARE YEAST    Culture   Final    FEW Normal respiratory flora-no Staph aureus or Pseudomonas seen Performed at [facility], [address], [place], [place] [address]    Report Status 03/31/2020 FINAL  Final  MRSA PCR Screening     Status: None   Collection Time: 03/29/20 12:26 PM   Specimen: Nasopharyngeal  Result Value Ref Range Status   MRSA by PCR NEGATIVE NEGATIVE Final    Comment:        The GeneXpert MRSA Assay (FDA approved for NASAL specimens only), is one component of a comprehensive MRSA colonization surveillance program. It is not intended to diagnose MRSA infection nor to guide or monitor treatment for MRSA infections. Performed at [facility], [place] [address] [place], [place] [address]          Radiology Studies: No results found.      Scheduled Meds:  arformoterol  15 mcg Nebulization BID   aspirin EC  81 mg Oral Daily   budesonide (PULMICORT) nebulizer solution  0.5 mg Nebulization BID   enoxaparin (LOVENOX) injection  40 mg Subcutaneous Q24H    fluconazole  100 mg Oral Daily   guaiFENesin  1,200 mg Oral BID   ipratropium-albuterol  3 mL Nebulization TID   losartan  25 mg Oral Daily   methylPREDNISolone (SOLU-MEDROL) injection  60 mg Intravenous Q6H   PARoxetine  30 mg Oral Daily   Continuous Infusions:  sodium chloride Stopped (03/24/20 1625)   azithromycin 500 mg (03/31/20 1315)   cefTRIAXone (ROCEPHIN)  IV 1 g (03/31/20  1128)     LOS: 7 days   Time spent: 25 mins Greater than 50% of this time was spent in counseling, explanation of diagnosis, planning of further management, and coordination of care.  I have personally reviewed and interpreted on  03/31/2020 daily labs,I reviewed all nursing notes, pharmacy notes,  vitals, pertinent old records  I have discussed plan of care as described above with RN , patient  on 03/31/2020  Voice Recognition /Dragon dictation system was used to create this note, attempts have been made to correct errors. Please contact the author with questions and/or clarifications.   Florencia Reasons, MD PhD FACP [facility]  Available via Epic secure chat 7am-7pm for nonurgent issues Please page for urgent issues To page the attending provider between 7A-7P or the covering provider during after hours 7P-7A, please log into the web site [email or web address] and access using universal [place] password for that web site. If you do not have the password, please call the hospital operator.    03/31/2020, 2:46 PM

## 2020-03-31 NOTE — Progress Notes (Signed)
CRITICAL VALUE ALERT  Critical Value:  Latic Acid is 3.6  Date & Time Notied:  03/31/2020 6:51 AM   Provider Notified: Sharion Settler NP  Orders Received/Actions taken: No orders at this time.

## 2020-04-01 LAB — CBC WITH DIFFERENTIAL/PLATELET
Abs Immature Granulocytes: 0.45 10*3/uL — ABNORMAL HIGH (ref 0.00–0.07)
Basophils Absolute: 0 10*3/uL (ref 0.0–0.1)
Basophils Relative: 0 %
Eosinophils Absolute: 0 10*3/uL (ref 0.0–0.5)
Eosinophils Relative: 0 %
HCT: 39.5 % (ref 36.0–46.0)
Hemoglobin: 12.8 g/dL (ref 12.0–15.0)
Immature Granulocytes: 3 %
Lymphocytes Relative: 5 %
Lymphs Abs: 0.9 10*3/uL (ref 0.7–4.0)
MCH: 30.5 pg (ref 26.0–34.0)
MCHC: 32.4 g/dL (ref 30.0–36.0)
MCV: 94.3 fL (ref 80.0–100.0)
Monocytes Absolute: 0.6 10*3/uL (ref 0.1–1.0)
Monocytes Relative: 3 %
Neutro Abs: 15.3 10*3/uL — ABNORMAL HIGH (ref 1.7–7.7)
Neutrophils Relative %: 89 %
Platelets: 306 10*3/uL (ref 150–400)
RBC: 4.19 MIL/uL (ref 3.87–5.11)
RDW: 13.9 % (ref 11.5–15.5)
WBC: 17.2 10*3/uL — ABNORMAL HIGH (ref 4.0–10.5)
nRBC: 0 % (ref 0.0–0.2)

## 2020-04-01 LAB — LACTIC ACID, PLASMA
Lactic Acid, Venous: 3 mmol/L (ref 0.5–1.9)
Lactic Acid, Venous: 2.6 mmol/L (ref 0.5–1.9)
Lactic Acid, Venous: 2.8 mmol/L (ref 0.5–1.9)

## 2020-04-01 LAB — BASIC METABOLIC PANEL
Anion gap: 11 (ref 5–15)
BUN: 37 mg/dL — ABNORMAL HIGH (ref 8–23)
CO2: 30 mmol/L (ref 22–32)
Calcium: 8.7 mg/dL — ABNORMAL LOW (ref 8.9–10.3)
Chloride: 100 mmol/L (ref 98–111)
Creatinine, Ser: 0.85 mg/dL (ref 0.44–1.00)
GFR, Estimated: >60 mL/min (ref >60)
Glucose, Bld: 151 mg/dL — ABNORMAL HIGH (ref 70–99)
Potassium: 4.2 mmol/L (ref 3.5–5.1)
Sodium: 141 mmol/L (ref 135–145)

## 2020-04-01 MED ORDER — BUDESONIDE 0.5 MG/2ML IN SUSP: 0.5000 mg | Freq: Two times a day (BID) | RESPIRATORY_TRACT | Status: DC

## 2020-04-01 MED ORDER — BUDESONIDE 0.25 MG/2ML IN SUSP: 0.2500 mg | Freq: Two times a day (BID) | RESPIRATORY_TRACT | Status: DC

## 2020-04-01 MED ORDER — VITAMIN D 25 MCG (1000 UNIT) PO TABS
2000.0000 [IU] | ORAL_TABLET | Freq: Every day | ORAL | Status: DC
  Administered 2020-04-01 – 2020-04-03 (×3): 2000 [IU] via ORAL
  Filled 2020-04-01 (×4): qty 2

## 2020-04-01 MED ORDER — FLUTICASONE PROPIONATE 50 MCG/ACT NA SUSP
2.0000 | Freq: Every day | NASAL | Status: DC
  Administered 2020-04-01 – 2020-04-03 (×3): 2 via NASAL
  Filled 2020-04-01: qty 16

## 2020-04-01 MED ORDER — METHYLPREDNISOLONE SODIUM SUCC 125 MG IJ SOLR
60.0000 mg | Freq: Three times a day (TID) | INTRAMUSCULAR | Status: DC
  Administered 2020-04-01 – 2020-04-02 (×2): 60 mg via INTRAVENOUS
  Filled 2020-04-01 (×2): qty 2

## 2020-04-01 NOTE — Progress Notes (Signed)
Name: Kim [name] MRN: [id number] DOB: 1956/02/26    ADMISSION DATE:  03/24/2020 CONSULTATION DATE:  04/01/2020   REFERRING MD :  Ledora Bottcher   CHIEF COMPLAINT: Shortness of breath, nonresolving COPD flare  BRIEF PATIENT DESCRIPTION: 64 year old ex-smoker with severe COPD and multiple flares maintained on low-dose prednisone since April 2021 admitted with nonresolving COPD flare  SIGNIFICANT EVENTS  June 2020- COPD exacerbation/Prednisone + zpack September- prednisone PCP December 2020- Prednisone + zpack ? PCP January 18th 2021- COPD exacerbation/Prednisone + doxycycline  Feb 5th 2021- COPD exacerbation/Prednisone + Zpack  Feb 9th 2021- Prolonged COPD exacerbation requiring high dose prednisone/ CXR ordered  April 4th- Hospitalized for COPD exacerbation treated with IV steriods and oral prednisone taper 08/12/2019 - Zpack and extrended prednisone taper   09/2019>> low-dose steroids - tapered to off by end August.  01/2020 OV >> pred 40 mg, settling at 10 02/2020 OV >> decrease to 5 mg with the goal of getting her off steroids completely within the next 2 months  STUDIES:  CT angiogram chest 03/24/2020  Severe centrilobular emphysema with mild superimposed diffuse ground-glass opacity with an apical predominance, similar in comparison to prior study. Findings likely reflect smoking related lung disease/respiratory bronchiolitis. New mild compression fracture deformity of L1 since 10/ 2021.  11/2014: FVC 2.00 L (63%) FEV1 1.00 L (41%)ratio50 negative bronchodilator response TLC 5.06 L (103%) RV 140% DLCO uncorrected 51%  01/2020 LDCT - stable, T7 fracture  HISTORY OF PRESENT ILLNESS: She has severe COPD with FEV1 around 40%.  She quit smoking in 2015.  She has oxygen at home to use on an as-needed basis.  She had multiple flares this year including a hospitalization 07/2019 and has been maintained on steroids since then.  Unfortunately she has a lot of osteoporosis and  vertebral fractures. At her last office visit we dropped her prednisone to 5 mg with the goal of tapering this to off she remains on Symbicort and Spiriva as maintenance medications .  She had a recent visit to [place] and [place].  After returning she suddenly developed shortness of breath and presented to the emergency room on 12/18, CT angiogram was negative for pulmonary embolism Covid and flu test were negative she denies preceding URI symptoms or fevers.  She received Pneumovax 3 days prior to admission. She improved somewhat initially but when steroids were decreased, breathing worsen hence we are consulted  SUBJECTIVE:  Breathing a little better Complains of bifrontal headache since admission  VITAL SIGNS: Temp:  [97.8 F (36.6 C)-98.3 F (36.8 C)] 98.3 F (36.8 C) (12/26 1331) Pulse Rate:  [76-90] 86 (12/26 1331) Resp:  [14-20] 20 (12/26 1331) BP: (133-157)/(77-84) 157/77 (12/26 1331) SpO2:  [94 %-96 %] 94 % (12/26 1413) Weight:  [60.3 kg] 60.3 kg (12/26 0831)  PHYSICAL EXAMINATION: Gen. Pleasant, thin elderly woman, in no distress ENT - no thrush, no pallor/icterus,no post nasal drip Neck: No JVD, no thyromegaly, no carotid bruits Lungs: no use of accessory muscles, faint scattered rhonchi Cardiovascular: Rhythm regular, heart sounds  normal, no murmurs or gallops, no peripheral edema Musculoskeletal: No deformities, no cyanosis or clubbing  Neuro -anxious affect, mild tremors   Recent Labs  Lab 03/30/20 0503 03/31/20 0506 04/01/20 0425  NA 141 142 141  K 4.7 4.6 4.2  CL 99 94* 100  CO2 31 34* 30  BUN 36* 38* 37*  CREATININE 0.78 0.86 0.85  GLUCOSE 153* 161* 151*   Recent Labs  Lab 03/29/20 0527 03/30/20 0503 04/01/20  0425  HGB 13.5 13.4 12.8  HCT 41.8 41.6 39.5  WBC 14.3* 15.3* 17.2*  PLT 337 337 306   No results found.  ASSESSMENT / PLAN:  COPD exacerbation, nonresolving, steroid-dependent on 5 mg prednisone -Etiology not clear, this does not  appear to be a classic infective exacerbation she had no preceding URI symptoms and no environmental exposure.  She blames it on Pneumovax -Sputum  culture is neg -No significant ILD on CT imaging   Recommend -Decrease Solu-Medrol 40 every 8 -Switched to Pulmicort and Brovana nebs -DuoNebs  every 6h prn -ceftx /azithro x 5ds  Lactic acidosis may be related to excessive bronchodilator usage, duo nebs to as needed Oral thrush - Diflucan for 3 days Osteoporosis /vertebral fractures- have to ask PCP to start biphosphonates   Kara Mead MD. Shade Flood. [facility] See Amion for pager  If no response to pager , please call [phone]  After 7:00 pm call Elink  [phone]    04/01/2020, 4:25 PM

## 2020-04-01 NOTE — Progress Notes (Signed)
CRITICAL VALUE ALERT  Critical Value:  Lactic acid 3.0  Date & Time Notied:  04/01/2020 @0612   Provider Notified: Randol Kern  Orders Received/Actions taken: None

## 2020-04-01 NOTE — Progress Notes (Signed)
PROGRESS NOTE    Kim [name]  EGB:[id number] DOB: 1955-11-08 DOA: 03/24/2020 PCP: [name], NP    Chief Complaint  Patient presents with  . Shortness of Breath    Brief Narrative:   64 year old female with history of COPD with chronic respiratory failure on steroids, hyperlipidemia, alpha-1 antitrypsin deficiency carrier, anxiety, depression who recently received pneumonia vaccine 3 days ago, also vaccinated against COVID-19 came to ED with complaints of sudden onset shortness of breath.  Patient is on oxygen as needed usually 1 ambulating long distances at 1 L/min.  Lately has been using 3 to 4 L/min since her symptom onset.  She went to [place] last week which was 5 hours drive back and forth.  Upon EMS arrival she was noted to have bilateral wheezing with SPO2 89% on room air and placed on 2 L/min of oxygen via nasal cannula. Subjective:  Remain the same, no improvement  + dyspnea, sob needing continuous oxygen supplement , dry cough, not able to cough up,  denies chest pain Reports frontal headache and nasal congestion    Assessment & Plan:   Principal Problem:   COPD exacerbation (HCC) Active Problems:   Anxiety and depression   Chronic diastolic CHF (congestive heart failure) (HCC)   Essential hypertension  COPD exacerbation/acute on chronic hypoxic respiratory failure -CTA on presentation no PE, does show severe centrilobular emphysema with mild superimposed diffuse groundglass opacity which is chronic -Influenza and COVID-19 screening negative, sputum culture normal flora -not improving, increased  Solu-Medrol back to  60 mg every 6 hours, continue DuoNeb, Mucinex, antibiotics  - pulmonology Dr Elsworth Soho consulted due to persistent symptom, will follow recommendation  Frontal headache With nasal congestion No sinus tenderness on exam As needed analgesic, Flonase, already on antibiotics  Leukocytosis -slowly  improving   Hypertension Blood pressure soft  on presentation.  Home medication losartan held  resumed losartan   History of Takotsubo cardiomyopathy: TTE 07/11/2019 EF 60-65%, no LV wall motion abnormalities, grade 1 diastolic dysfunction Take Lasix as needed at home, currently no edema on exam  Anxiety/depression-continue Paxil   Unchanged scattered pulmonary nodules  New mild compression fracture deformity of L1 since October of 2021   DVT prophylaxis: Place and maintain sequential compression device Start: 03/31/20 1345 enoxaparin (LOVENOX) injection 40 mg Start: 03/24/20 2200   Code Status:DNR Family Communication: Patient Disposition:   Status is: Inpatient   Dispo: The patient is from: Home              Anticipated d/c is to: Home              Anticipated d/c date is: TBD depends on respiratory status                Consultants:   Pulmonology Dr. Elsworth Soho  Procedures:   None  Antimicrobials:   Doxycycline fr om admission  Levaquin from December 23 then rocephin/zithro     Objective: Vitals:   03/31/20 2247 04/01/20 0302 04/01/20 0540 04/01/20 0831  BP:   133/84   Pulse:   76   Resp:   14   Temp:   97.8 F (36.6 C)   TempSrc:      SpO2: 96% 95% 96%   Weight:    60.3 kg  Height:        Intake/Output Summary (Last 24 hours) at 04/01/2020 1019 Last data filed at 04/01/2020 0600 Gross per 24 hour  Intake 970.69 ml  Output 0 ml  Net 970.69 ml  Filed Weights   03/24/20 0939 03/25/20 1129 04/01/20 0831  Weight: 59 kg 60.2 kg 60.3 kg    Examination:  General exam: Dyspnea Respiratory system: Tight, with minimal wheezing.  No rhonchi heard today,, Dyspnea Cardiovascular system: S1 & S2 heard, RRR. No JVD, no murmur, No pedal edema. Gastrointestinal system: Abdomen is nondistended, soft and nontender. Normal bowel sounds heard. Central nervous system: Alert and oriented. No focal neurological deficits. Extremities: Symmetric 5 x 5 power. Skin: No rashes, lesions or ulcers Psychiatry:  Judgement and insight appear normal. Mood & affect appropriate.     Data Reviewed: I have personally reviewed following labs and imaging studies  CBC: Recent Labs  Lab 03/27/20 0448 03/28/20 0525 03/29/20 0527 03/30/20 0503 04/01/20 0425  WBC 19.5* 15.8* 14.3* 15.3* 17.2*  NEUTROABS  --   --   --  13.4* 15.3*  HGB 12.9 13.4 13.5 13.4 12.8  HCT 40.6 41.1 41.8 41.6 39.5  MCV 98.8 96.0 95.7 95.2 94.3  PLT 321 323 337 337 [id number]    Basic Metabolic Panel: Recent Labs  Lab 03/28/20 0525 03/29/20 0527 03/30/20 0503 03/31/20 0506 04/01/20 0425  NA 141 143 141 142 141  K 3.7 4.6 4.7 4.6 4.2  CL 98 99 99 94* 100  CO2 31 33* 31 34* 30  GLUCOSE 130* 134* 153* 161* 151*  BUN 39* 32* 36* 38* 37*  CREATININE 0.80 0.82 0.78 0.86 0.85  CALCIUM 9.1 9.1 8.9 9.2 8.7*  MG  --   --  2.5*  --   --     GFR: Estimated Creatinine Clearance: 53.9 mL/min (by C-G formula based on SCr of 0.85 mg/dL).  Liver Function Tests: Recent Labs  Lab 03/30/20 0503  AST 20  ALT 28  ALKPHOS 48  BILITOT 0.3  PROT 5.7*  ALBUMIN 3.4*    CBG: No results for input(s): GLUCAP in the last 168 hours.   Recent Results (from the past 240 hour(s))  Resp Panel by RT-PCR (Flu A&B, Covid) Nasopharyngeal Swab     Status: None   Collection Time: 03/24/20  9:50 AM   Specimen: Nasopharyngeal Swab; Nasopharyngeal(NP) swabs in vial transport medium  Result Value Ref Range Status   SARS Coronavirus 2 by RT PCR NEGATIVE NEGATIVE Final    Comment: (NOTE) SARS-CoV-2 target nucleic acids are NOT DETECTED.  The SARS-CoV-2 RNA is generally detectable in upper respiratory specimens during the acute phase of infection. The lowest concentration of SARS-CoV-2 viral copies this assay can detect is 138 copies/mL. A negative result does not preclude SARS-Cov-2 infection and should not be used as the sole basis for treatment or other patient management decisions. A negative result may occur with  improper specimen  collection/handling, submission of specimen other than nasopharyngeal swab, presence of viral mutation(s) within the areas targeted by this assay, and inadequate number of viral copies(<138 copies/mL). A negative result must be combined with clinical observations, patient history, and epidemiological information. The expected result is Negative.  Fact Sheet for Patients:  EntrepreneurPulse.com.au  Fact Sheet for Healthcare Providers:  IncredibleEmployment.be  This test is no t yet approved or cleared by the [place] FDA and  has been authorized for detection and/or diagnosis of SARS-CoV-2 by FDA under an Emergency Use Authorization (EUA). This EUA will remain  in effect (meaning this test can be used) for the duration of the COVID-19 declaration under Section 564(b)(1) of the Act, 21 U.S.C.section 360bbb-3(b)(1), unless the authorization is terminated  or revoked sooner.  Influenza A by PCR NEGATIVE NEGATIVE Final   Influenza B by PCR NEGATIVE NEGATIVE Final    Comment: (NOTE) The Xpert Xpress SARS-CoV-2/FLU/RSV plus assay is intended as an aid in the diagnosis of influenza from Nasopharyngeal swab specimens and should not be used as a sole basis for treatment. Nasal washings and aspirates are unacceptable for Xpert Xpress SARS-CoV-2/FLU/RSV testing.  Fact Sheet for Patients: EntrepreneurPulse.com.au  Fact Sheet for Healthcare Providers: IncredibleEmployment.be  This test is not yet approved or cleared by the [place] FDA and has been authorized for detection and/or diagnosis of SARS-CoV-2 by FDA under an Emergency Use Authorization (EUA). This EUA will remain in effect (meaning this test can be used) for the duration of the COVID-19 declaration under Section 564(b)(1) of the Act, 21 U.S.C. section 360bbb-3(b)(1), unless the authorization is terminated or revoked.  Performed at [facility], [place] [address] [place], [place] [address]   Expectorated sputum assessment w rflx to resp cult     Status: None   Collection Time: 03/28/20  3:39 PM   Specimen: Expectorated Sputum  Result Value Ref Range Status   Specimen Description EXPECTORATED SPUTUM  Final   Special Requests NONE  Final   Sputum evaluation   Final    THIS SPECIMEN IS ACCEPTABLE FOR SPUTUM CULTURE Performed at [facility], [place] [address] [place], [place] [address]    Report Status 03/28/2020 FINAL  Final  Culture, respiratory     Status: None   Collection Time: 03/28/20  3:39 PM  Result Value Ref Range Status   Specimen Description   Final    EXPECTORATED SPUTUM Performed at [place] [address], [place], [place] [address]    Special Requests   Final    NONE Reflexed from [id number] Performed at [facility], [place] [address], [place], [place] [address]    Gram Stain   Final    FEW WBC PRESENT, PREDOMINANTLY PMN RARE SQUAMOUS EPITHELIAL CELLS PRESENT ABUNDANT GRAM POSITIVE COCCI RARE YEAST    Culture   Final    FEW Normal respiratory flora-no Staph aureus or Pseudomonas seen Performed at [facility], [address] [place], [place] [address]    Report Status 03/31/2020 FINAL  Final  MRSA PCR Screening     Status: None   Collection Time: 03/29/20 12:26 PM   Specimen: Nasopharyngeal  Result Value Ref Range Status   MRSA by PCR NEGATIVE NEGATIVE Final    Comment:        The GeneXpert MRSA Assay (FDA approved for NASAL specimens only), is one component of a comprehensive MRSA colonization surveillance program. It is not intended to diagnose MRSA infection nor to guide or monitor treatment for MRSA infections. Performed at [facility], [place] [address] [place], [place] [address]          Radiology Studies: No results found.      Scheduled Meds: . arformoterol  15 mcg  Nebulization BID  . aspirin EC  81 mg Oral Daily  . budesonide (PULMICORT) nebulizer solution  0.5 mg Nebulization BID  . enoxaparin (LOVENOX) injection  40 mg Subcutaneous Q24H  . fluticasone  2 spray Each Nare Daily  . guaiFENesin  1,200 mg Oral BID  . ipratropium-albuterol  3 mL Nebulization TID  . losartan  25 mg Oral Daily  . methylPREDNISolone (SOLU-MEDROL) injection  60 mg Intravenous Q6H  . PARoxetine  30 mg Oral Daily  .  Vitamin D3  2,000 Units Oral Daily   Continuous Infusions: . sodium chloride Stopped (03/24/20 1625)  . azithromycin 500 mg (03/31/20 1315)  . cefTRIAXone (ROCEPHIN)  IV 1 g (04/01/20 0930)     LOS: 8 days   Time spent: 25 mins Greater than 50% of this time was spent in counseling, explanation of diagnosis, planning of further management, and coordination of care.  I have personally reviewed and interpreted on  04/01/2020 daily labs,I reviewed all nursing notes, pharmacy notes,  vitals, pertinent old records  I have discussed plan of care as described above with RN , patient  on 04/01/2020  Voice Recognition /Dragon dictation system was used to create this note, attempts have been made to correct errors. Please contact the author with questions and/or clarifications.   Florencia Reasons, MD PhD FACP [facility]  Available via Epic secure chat 7am-7pm for nonurgent issues Please page for urgent issues To page the attending provider between 7A-7P or the covering provider during after hours 7P-7A, please log into the web site [email or web address] and access using universal [place] password for that web site. If you do not have the password, please call the hospital operator.    04/01/2020, 10:19 AM

## 2020-04-02 LAB — BASIC METABOLIC PANEL
Anion gap: 8 (ref 5–15)
BUN: 32 mg/dL — ABNORMAL HIGH (ref 8–23)
CO2: 32 mmol/L (ref 22–32)
Calcium: 8.8 mg/dL — ABNORMAL LOW (ref 8.9–10.3)
Chloride: 100 mmol/L (ref 98–111)
Creatinine, Ser: 0.79 mg/dL (ref 0.44–1.00)
GFR, Estimated: >60 mL/min (ref >60)
Glucose, Bld: 155 mg/dL — ABNORMAL HIGH (ref 70–99)
Potassium: 4.6 mmol/L (ref 3.5–5.1)
Sodium: 140 mmol/L (ref 135–145)

## 2020-04-02 LAB — CBC WITH DIFFERENTIAL/PLATELET
Abs Immature Granulocytes: 0.55 10*3/uL — ABNORMAL HIGH (ref 0.00–0.07)
Basophils Absolute: 0 10*3/uL (ref 0.0–0.1)
Basophils Relative: 0 %
Eosinophils Absolute: 0 10*3/uL (ref 0.0–0.5)
Eosinophils Relative: 0 %
HCT: 40.8 % (ref 36.0–46.0)
Hemoglobin: 13.3 g/dL (ref 12.0–15.0)
Immature Granulocytes: 3 %
Lymphocytes Relative: 5 %
Lymphs Abs: 0.9 10*3/uL (ref 0.7–4.0)
MCH: 31.2 pg (ref 26.0–34.0)
MCHC: 32.6 g/dL (ref 30.0–36.0)
MCV: 95.8 fL (ref 80.0–100.0)
Monocytes Absolute: 0.6 10*3/uL (ref 0.1–1.0)
Monocytes Relative: 3 %
Neutro Abs: 15.7 10*3/uL — ABNORMAL HIGH (ref 1.7–7.7)
Neutrophils Relative %: 89 %
Platelets: 303 10*3/uL (ref 150–400)
RBC: 4.26 MIL/uL (ref 3.87–5.11)
RDW: 14 % (ref 11.5–15.5)
WBC: 17.8 10*3/uL — ABNORMAL HIGH (ref 4.0–10.5)
nRBC: 0 % (ref 0.0–0.2)

## 2020-04-02 MED ORDER — BUDESONIDE 0.5 MG/2ML IN SUSP
0.5000 mg | Freq: Two times a day (BID) | RESPIRATORY_TRACT | Status: DC
  Administered 2020-04-02 (×2): 0.5 mg via RESPIRATORY_TRACT
  Filled 2020-04-02 (×3): qty 2

## 2020-04-02 MED ORDER — BUTALBITAL-APAP-CAFFEINE 50-325-40 MG PO TABS: 1.0000 | ORAL_TABLET | ORAL | Status: DC | PRN

## 2020-04-02 MED ORDER — CEFDINIR 300 MG PO CAPS
300.0000 mg | ORAL_CAPSULE | Freq: Two times a day (BID) | ORAL | Status: DC
  Administered 2020-04-02 – 2020-04-03 (×3): 300 mg via ORAL
  Filled 2020-04-02 (×3): qty 1

## 2020-04-02 MED ORDER — PREDNISONE 20 MG PO TABS
20.0000 mg | ORAL_TABLET | Freq: Two times a day (BID) | ORAL | Status: DC
  Administered 2020-04-02 – 2020-04-03 (×2): 20 mg via ORAL
  Filled 2020-04-02 (×2): qty 1

## 2020-04-02 MED ORDER — BUDESONIDE 0.25 MG/2ML IN SUSP
RESPIRATORY_TRACT | Status: AC
  Administered 2020-04-02: 0.25 mg
  Filled 2020-04-02: qty 8

## 2020-04-02 NOTE — Progress Notes (Signed)
Name: Kim [name] MRN: [id number] DOB: Dec 31, 1955    ADMISSION DATE:  03/24/2020 CONSULTATION DATE:  04/02/2020   REFERRING MD :  Ledora Bottcher   CHIEF COMPLAINT: Shortness of breath, nonresolving COPD flare  BRIEF PATIENT DESCRIPTION: 64 year old ex-smoker with severe COPD and multiple flares maintained on low-dose prednisone since April 2021 admitted with nonresolving COPD flare Stated symptoms started following receiving Pneumovax shot  SIGNIFICANT EVENTS  June 2020- COPD exacerbation/Prednisone + zpack September- prednisone PCP December 2020- Prednisone + zpack ? PCP January 18th 2021- COPD exacerbation/Prednisone + doxycycline  Feb 5th 2021- COPD exacerbation/Prednisone + Zpack  Feb 9th 2021- Prolonged COPD exacerbation requiring high dose prednisone/ CXR ordered  April 4th- Hospitalized for COPD exacerbation treated with IV steriods and oral prednisone taper 08/12/2019 - Zpack and extrended prednisone taper   09/2019>> low-dose steroids - tapered to off by end August.  01/2020 OV >> pred 40 mg, settling at 10 02/2020 OV >> decrease to 5 mg with the goal of getting her off steroids completely within the next 2 months  STUDIES:  CT angiogram chest 03/24/2020  Severe centrilobular emphysema with mild superimposed diffuse ground-glass opacity with an apical predominance, similar in comparison to prior study. Findings likely reflect smoking related lung disease/respiratory bronchiolitis. New mild compression fracture deformity of L1 since 10/ 2021.  11/2014: FVC 2.00 L (63%) FEV1 1.00 L (41%)ratio50 negative bronchodilator response TLC 5.06 L (103%) RV 140% DLCO uncorrected 51%  01/2020 LDCT - stable, T7 fracture  HISTORY OF PRESENT ILLNESS: She has severe COPD with FEV1 around 40%.  She quit smoking in 2015.  She has oxygen at home to use on an as-needed basis.  She had multiple flares this year including a hospitalization 07/2019 and has been maintained on steroids since  then.  Unfortunately she has a lot of osteoporosis and vertebral fractures. At her last office visit we dropped her prednisone to 5 mg with the goal of tapering this to off she remains on Symbicort and Spiriva as maintenance medications .  She had a recent visit to [place] and [place].  After returning she suddenly developed shortness of breath and presented to the emergency room on 12/18, CT angiogram was negative for pulmonary embolism Covid and flu test were negative she denies preceding URI symptoms or fevers.  She received Pneumovax 3 days prior to admission. She improved somewhat initially but when steroids were decreased, breathing worsen hence we are consulted Feels a little bit better generally  SUBJECTIVE:  Breathing feels better Headache, a little bit better  VITAL SIGNS: Temp:  [97.4 F (36.3 C)-98.3 F (36.8 C)] 97.9 F (36.6 C) (12/27 0539) Pulse Rate:  [82-87] 82 (12/27 0539) Resp:  [18-20] 19 (12/27 0539) BP: (133-157)/(68-79) 133/68 (12/27 0539) SpO2:  [94 %-98 %] 94 % (12/27 0616) Weight:  [60.3 kg-60.4 kg] 60.4 kg (12/27 0539)  PHYSICAL EXAMINATION: Gen. in no acute distress ENT -moist oral mucosa Neck: No JVD, no thyromegaly Lungs: Poor air movement bilaterally, no wheezes, no rhonchi Cardiovascular: S1-S2 appreciated, no murmur Musculoskeletal: No deformities, no cyanosis or clubbing  Neuro -anxious affect, mild tremors  CT scan of the chest reviewed by myself 12/27 -No acute infiltrate, extensive emphysema  Recent Labs  Lab 03/31/20 0506 04/01/20 0425 04/02/20 0403  NA 142 141 140  K 4.6 4.2 4.6  CL 94* 100 100  CO2 34* 30 32  BUN 38* 37* 32*  CREATININE 0.86 0.85 0.79  GLUCOSE 161* 151* 155*   Recent Labs  Lab 03/30/20 0503 04/01/20 0425 04/02/20 0403  HGB 13.4 12.8 13.3  HCT 41.6 39.5 40.8  WBC 15.3* 17.2* 17.8*  PLT 337 306 303   No results found.  ASSESSMENT / PLAN:  COPD exacerbation- nonresolving, steroid dependent-was on 5 mg  daily of prednisone prior to decompensation -Current exacerbation was related to receiving a Pneumovax vaccine according to patient -Sputum culture negative -CT scan of the chest unremarkable   Will transition to oral steroids -Continue nebulization treatments with Pulmicort and Brovana -DuoNebs every 6 -Antibiotics -Has had 3 days of azithromycin, switch from Rocephin to Omnicef orally  Will start patient on prednisone 20 p.o. twice daily -Goal would be to try and decrease gradually to maintenance dose and see whether we can stop steroids -Significant concern with compression fractures  On Diflucan for oral thrush Osteoporosis/vertebral fractures-defer to PCP to start bisphosphonates  Virl Diamond, MD [facility] Pager: [phone]

## 2020-04-02 NOTE — Progress Notes (Signed)
PROGRESS NOTE    Kim [name]  [id number] DOB: 1955/08/03 DOA: 03/24/2020 PCP: [name], NP   Chief Complain: Shortness of breath  Brief Narrative: Patient is a 64 year old female with COPD on chronic steroid therapy, hyperlipidemia, alpha-1 antitrypsin deficiency carrier, anxiety, depression who presented with sudden onset of shortness of breath.  She is on oxygen at home at 1 L/min.  She needed more oxygen at home.  She was admitted for COPD exacerbation management.  PCCM following  Assessment & Plan:   Principal Problem:   COPD exacerbation ([place]) Active Problems:   Anxiety and depression   Chronic diastolic CHF (congestive heart failure) (HCC)   Essential hypertension   COPD exacerbation/acute on chronic hypoxic respiratory failure: Has history of severe COPD.  On chronic steroid therapy.  Symptoms started after receiving Pneumovax shot.  She had multiple flares recently.  Presented with increased dyspnea, increased oxygen requirement.  Chest imaging did not show any PE but showed severe centrilobular emphysema with mild superimposed diffuse groundglass opacity.  Influenza/Covid screening negative.  She was initially started on IV steroids which have been changed to oral.  PCCM following. Also on oral antibiotics.  Continue bronchodilators  Frontal headache/sinus congestion: Continue analgesics, Flonase.Started on fiorecet  Hypertension: Continue losartan.  Monitor blood pressure  Diastolic congestive heart failure: TTE on 07/11/2019 showed ejection fraction of 123456, grade 1 diastolic dysfunction.  Currently euvolemic, takes Lasix as needed at home.  Leukocytosis: Improving  Anxiety/depression: On Paxil  Oral thrush: On Diflucan  History of osteoporosis/vertebral fractures: Recommend to be started on biphosphonates as an outpatient           DVT prophylaxis:Lovenox Code Status: DNR Family Communication: None at bedside Status is: Inpatient  Remains  inpatient appropriate because:Inpatient level of care appropriate due to severity of illness   Dispo: The patient is from: Home              Anticipated d/c is to: Home              Anticipated d/c date is: 2 days              Patient currently is not medically stable to d/c.     Consultants: PCCM  Procedures:None  Antimicrobials:  Anti-infectives (From admission, onward)   Start     Dose/Rate Route Frequency Ordered Stop   04/02/20 1000  cefdinir (OMNICEF) capsule 300 mg        300 mg Oral Every 12 hours 04/02/20 0830 04/05/20 0959   03/30/20 1600  fluconazole (DIFLUCAN) tablet 100 mg        100 mg Oral Daily 03/30/20 1457 04/01/20 0926   03/30/20 1200  azithromycin (ZITHROMAX) 500 mg in sodium chloride 0.9 % 250 mL IVPB  Status:  Discontinued        500 mg 250 mL/hr over 60 Minutes Intravenous Every 24 hours 03/30/20 0912 04/02/20 0830   03/30/20 1000  cefTRIAXone (ROCEPHIN) 1 g in sodium chloride 0.9 % 100 mL IVPB  Status:  Discontinued        1 g 200 mL/hr over 30 Minutes Intravenous Every 24 hours 03/30/20 0912 04/02/20 0830   03/29/20 2200  doxycycline (VIBRA-TABS) tablet 100 mg  Status:  Discontinued        100 mg Oral Every 12 hours 03/29/20 1051 03/30/20 0912   03/29/20 1200  levofloxacin (LEVAQUIN) IVPB 750 mg  Status:  Discontinued        750 mg 100 mL/hr over 90 Minutes  Intravenous Every 24 hours 03/29/20 1053 03/30/20 0912   03/25/20 1100  doxycycline (VIBRA-TABS) tablet 100 mg  Status:  Discontinued        100 mg Oral Every 12 hours 03/25/20 1001 03/29/20 1048      Subjective: Patient seen and examined at the bedside this afternoon.  Comfortable while at rest.  Still has bilateral expiratory wheezes.  She is ambulating with the help of walker.  Complains of dry cough and headache  Objective: Vitals:   04/02/20 0616 04/02/20 1005 04/02/20 1115 04/02/20 1351  BP:      Pulse:      Resp:      Temp:      TempSrc:      SpO2: 94% 94% 91% 93%  Weight:       Height:        Intake/Output Summary (Last 24 hours) at 04/02/2020 1446 Last data filed at 04/02/2020 1027 Gross per 24 hour  Intake 470 ml  Output --  Net 470 ml   Filed Weights   03/25/20 1129 04/01/20 0831 04/02/20 0539  Weight: 60.2 kg 60.3 kg 60.4 kg    Examination:  General exam: Pleasant female Respiratory system: Bilateral expiratory wheezes  cardiovascular system: S1 & S2 heard, RRR. No JVD, murmurs, rubs, gallops or clicks. No pedal edema. Gastrointestinal system: Abdomen is nondistended, soft and nontender. No organomegaly or masses felt. Normal bowel sounds heard. Central nervous system: Alert and oriented. No focal neurological deficits. Extremities: No edema, no clubbing ,no cyanosis Skin: No rashes, lesions or ulcers,no icterus ,no pallor   Data Reviewed: I have personally reviewed following labs and imaging studies  CBC: Recent Labs  Lab 03/28/20 0525 03/29/20 0527 03/30/20 0503 04/01/20 0425 04/02/20 0403  WBC 15.8* 14.3* 15.3* 17.2* 17.8*  NEUTROABS  --   --  13.4* 15.3* 15.7*  HGB 13.4 13.5 13.4 12.8 13.3  HCT 41.1 41.8 41.6 39.5 40.8  MCV 96.0 95.7 95.2 94.3 95.8  PLT 323 337 337 306 [id number]   Basic Metabolic Panel: Recent Labs  Lab 03/29/20 0527 03/30/20 0503 03/31/20 0506 04/01/20 0425 04/02/20 0403  NA 143 141 142 141 140  K 4.6 4.7 4.6 4.2 4.6  CL 99 99 94* 100 100  CO2 33* 31 34* 30 32  GLUCOSE 134* 153* 161* 151* 155*  BUN 32* 36* 38* 37* 32*  CREATININE 0.82 0.78 0.86 0.85 0.79  CALCIUM 9.1 8.9 9.2 8.7* 8.8*  MG  --  2.5*  --   --   --    GFR: Estimated Creatinine Clearance: 57.2 mL/min (by C-G formula based on SCr of 0.79 mg/dL). Liver Function Tests: Recent Labs  Lab 03/30/20 0503  AST 20  ALT 28  ALKPHOS 48  BILITOT 0.3  PROT 5.7*  ALBUMIN 3.4*   No results for input(s): LIPASE, AMYLASE in the last 168 hours. No results for input(s): AMMONIA in the last 168 hours. Coagulation Profile: No results for input(s):  INR, PROTIME in the last 168 hours. Cardiac Enzymes: No results for input(s): CKTOTAL, CKMB, CKMBINDEX, TROPONINI in the last 168 hours. BNP (last 3 results) No results for input(s): PROBNP in the last 8760 hours. HbA1C: No results for input(s): HGBA1C in the last 72 hours. CBG: No results for input(s): GLUCAP in the last 168 hours. Lipid Profile: No results for input(s): CHOL, HDL, LDLCALC, TRIG, CHOLHDL, LDLDIRECT in the last 72 hours. Thyroid Function Tests: No results for input(s): TSH, T4TOTAL, FREET4, T3FREE, THYROIDAB in the last 72  hours. Anemia Panel: No results for input(s): VITAMINB12, FOLATE, FERRITIN, TIBC, IRON, RETICCTPCT in the last 72 hours. Sepsis Labs: Recent Labs  Lab 03/31/20 0506 04/01/20 0425 04/01/20 0734 04/01/20 0943  LATICACIDVEN 3.6* 3.0* 2.6* 2.8*    Recent Results (from the past 240 hour(s))  Resp Panel by RT-PCR (Flu A&B, Covid) Nasopharyngeal Swab     Status: None   Collection Time: 03/24/20  9:50 AM   Specimen: Nasopharyngeal Swab; Nasopharyngeal(NP) swabs in vial transport medium  Result Value Ref Range Status   SARS Coronavirus 2 by RT PCR NEGATIVE NEGATIVE Final    Comment: (NOTE) SARS-CoV-2 target nucleic acids are NOT DETECTED.  The SARS-CoV-2 RNA is generally detectable in upper respiratory specimens during the acute phase of infection. The lowest concentration of SARS-CoV-2 viral copies this assay can detect is 138 copies/mL. A negative result does not preclude SARS-Cov-2 infection and should not be used as the sole basis for treatment or other patient management decisions. A negative result may occur with  improper specimen collection/handling, submission of specimen other than nasopharyngeal swab, presence of viral mutation(s) within the areas targeted by this assay, and inadequate number of viral copies(<138 copies/mL). A negative result must be combined with clinical observations, patient history, and  epidemiological information. The expected result is Negative.  Fact Sheet for Patients:  BloggerCourse.com  Fact Sheet for Healthcare Providers:  SeriousBroker.it  This test is no t yet approved or cleared by the [place] FDA and  has been authorized for detection and/or diagnosis of SARS-CoV-2 by FDA under an Emergency Use Authorization (EUA). This EUA will remain  in effect (meaning this test can be used) for the duration of the COVID-19 declaration under Section 564(b)(1) of the Act, 21 U.S.C.section 360bbb-3(b)(1), unless the authorization is terminated  or revoked sooner.       Influenza A by PCR NEGATIVE NEGATIVE Final   Influenza B by PCR NEGATIVE NEGATIVE Final    Comment: (NOTE) The Xpert Xpress SARS-CoV-2/FLU/RSV plus assay is intended as an aid in the diagnosis of influenza from Nasopharyngeal swab specimens and should not be used as a sole basis for treatment. Nasal washings and aspirates are unacceptable for Xpert Xpress SARS-CoV-2/FLU/RSV testing.  Fact Sheet for Patients: BloggerCourse.com  Fact Sheet for Healthcare Providers: SeriousBroker.it  This test is not yet approved or cleared by the [place] FDA and has been authorized for detection and/or diagnosis of SARS-CoV-2 by FDA under an Emergency Use Authorization (EUA). This EUA will remain in effect (meaning this test can be used) for the duration of the COVID-19 declaration under Section 564(b)(1) of the Act, 21 U.S.C. section 360bbb-3(b)(1), unless the authorization is terminated or revoked.  Performed at [facility], [address], [place], [place] [address]   Expectorated sputum assessment w rflx to resp cult     Status: None   Collection Time: 03/28/20  3:39 PM   Specimen: Expectorated Sputum  Result Value Ref Range Status   Specimen Description EXPECTORATED SPUTUM   Final   Special Requests NONE  Final   Sputum evaluation   Final    THIS SPECIMEN IS ACCEPTABLE FOR SPUTUM CULTURE Performed at [facility], [address] [place], [place] [address]    Report Status 03/28/2020 FINAL  Final  Culture, respiratory     Status: None   Collection Time: 03/28/20  3:39 PM  Result Value Ref Range Status   Specimen Description   Final    EXPECTORATED SPUTUM Performed at  [facility], [place] [address] [place] [address] [place], [place] [address]    Special Requests   Final    NONE Reflexed from [id number] Performed at [facility], [place] [address] [place], [place] [address]    Gram Stain   Final    FEW WBC PRESENT, PREDOMINANTLY PMN RARE SQUAMOUS EPITHELIAL CELLS PRESENT ABUNDANT GRAM POSITIVE COCCI RARE YEAST    Culture   Final    FEW Normal respiratory flora-no Staph aureus or Pseudomonas seen Performed at [facility], [address], [place], [place] [address]    Report Status 03/31/2020 FINAL  Final  MRSA PCR Screening     Status: None   Collection Time: 03/29/20 12:26 PM   Specimen: Nasopharyngeal  Result Value Ref Range Status   MRSA by PCR NEGATIVE NEGATIVE Final    Comment:        The GeneXpert MRSA Assay (FDA approved for NASAL specimens only), is one component of a comprehensive MRSA colonization surveillance program. It is not intended to diagnose MRSA infection nor to guide or monitor treatment for MRSA infections. Performed at [facility], [place] [address], [place], [place] [address]          Radiology Studies: No results found.      Scheduled Meds: . arformoterol  15 mcg Nebulization BID  . aspirin EC  81 mg Oral Daily  . budesonide (PULMICORT) nebulizer solution  0.5 mg Nebulization BID  . cefdinir  300 mg Oral Q12H  . cholecalciferol  2,000 Units Oral Daily  . enoxaparin (LOVENOX) injection  40 mg Subcutaneous Q24H  . fluticasone  2 spray Each Nare  Daily  . guaiFENesin  1,200 mg Oral BID  . ipratropium-albuterol  3 mL Nebulization TID  . losartan  25 mg Oral Daily  . PARoxetine  30 mg Oral Daily  . predniSONE  20 mg Oral BID WC   Continuous Infusions: . sodium chloride Stopped (03/24/20 1625)     LOS: 9 days    Time spent: More than 50% of that time was spent in counseling and/or coordination of care.      Shelly Coss, MD [facility] P12/27/2021, 2:46 PM

## 2020-04-03 ENCOUNTER — Other Ambulatory Visit: Payer: Self-pay | Admitting: Adult Health

## 2020-04-03 DIAGNOSIS — F419 Anxiety disorder, unspecified: Secondary | ICD-10-CM

## 2020-04-03 LAB — CBC WITH DIFFERENTIAL/PLATELET
Abs Immature Granulocytes: 0.69 10*3/uL — ABNORMAL HIGH (ref 0.00–0.07)
Basophils Absolute: 0 10*3/uL (ref 0.0–0.1)
Basophils Relative: 0 %
Eosinophils Absolute: 0 10*3/uL (ref 0.0–0.5)
Eosinophils Relative: 0 %
HCT: 40.1 % (ref 36.0–46.0)
Hemoglobin: 12.9 g/dL (ref 12.0–15.0)
Immature Granulocytes: 4 %
Lymphocytes Relative: 10 %
Lymphs Abs: 1.9 10*3/uL (ref 0.7–4.0)
MCH: 30.7 pg (ref 26.0–34.0)
MCHC: 32.2 g/dL (ref 30.0–36.0)
MCV: 95.5 fL (ref 80.0–100.0)
Monocytes Absolute: 1.4 10*3/uL — ABNORMAL HIGH (ref 0.1–1.0)
Monocytes Relative: 7 %
Neutro Abs: 15.4 10*3/uL — ABNORMAL HIGH (ref 1.7–7.7)
Neutrophils Relative %: 79 %
Platelets: 283 10*3/uL (ref 150–400)
RBC: 4.2 MIL/uL (ref 3.87–5.11)
RDW: 14.1 % (ref 11.5–15.5)
WBC: 19.4 10*3/uL — ABNORMAL HIGH (ref 4.0–10.5)
nRBC: 0 % (ref 0.0–0.2)

## 2020-04-03 LAB — LACTIC ACID, PLASMA: Lactic Acid, Venous: 2.1 mmol/L (ref 0.5–1.9)

## 2020-04-03 MED ORDER — PREDNISONE 10 MG PO TABS: 40.0000 mg | ORAL_TABLET | Freq: Every day | ORAL | 0 refills | Status: DC

## 2020-04-03 MED ORDER — BUTALBITAL-APAP-CAFFEINE 50-325-40 MG PO TABS: 1.0000 | ORAL_TABLET | ORAL | 0 refills | Status: DC | PRN

## 2020-04-03 MED ORDER — PREDNISONE 5 MG PO TABS: 5.0000 mg | ORAL_TABLET | Freq: Every day | ORAL | 0 refills | Status: DC

## 2020-04-03 MED ORDER — CEFDINIR 300 MG PO CAPS: 300.0000 mg | ORAL_CAPSULE | Freq: Two times a day (BID) | ORAL | 0 refills | Status: AC

## 2020-04-03 MED ORDER — GUAIFENESIN ER 600 MG PO TB12: 1200.0000 mg | ORAL_TABLET | Freq: Two times a day (BID) | ORAL | 0 refills | Status: AC

## 2020-04-03 NOTE — Progress Notes (Signed)
Name: Kim [name] MRN: [id number] DOB: 06-07-1955    ADMISSION DATE:  03/24/2020 CONSULTATION DATE:  04/03/2020   REFERRING MD :  Ledora Bottcher   CHIEF COMPLAINT: Shortness of breath, nonresolving COPD flare  BRIEF PATIENT DESCRIPTION: 64 year old ex-smoker with severe COPD and multiple flares maintained on low-dose prednisone since April 2021 admitted with nonresolving COPD flare Stated symptoms started following receiving Pneumovax shot  SIGNIFICANT EVENTS  June 2020- COPD exacerbation/Prednisone + zpack September- prednisone PCP December 2020- Prednisone + zpack ? PCP January 18th 2021- COPD exacerbation/Prednisone + doxycycline  Feb 5th 2021- COPD exacerbation/Prednisone + Zpack  Feb 9th 2021- Prolonged COPD exacerbation requiring high dose prednisone/ CXR ordered  April 4th- Hospitalized for COPD exacerbation treated with IV steriods and oral prednisone taper 08/12/2019 - Zpack and extrended prednisone taper   09/2019>> low-dose steroids - tapered to off by end August.  01/2020 OV >> pred 40 mg, settling at 10 02/2020 OV >> decrease to 5 mg with the goal of getting her off steroids completely within the next 2 months  STUDIES:  CT angiogram chest 03/24/2020  Severe centrilobular emphysema with mild superimposed diffuse ground-glass opacity with an apical predominance, similar in comparison to prior study. Findings likely reflect smoking related lung disease/respiratory bronchiolitis. New mild compression fracture deformity of L1 since 10/ 2021.  11/2014: FVC 2.00 L (63%) FEV1 1.00 L (41%)ratio50 negative bronchodilator response TLC 5.06 L (103%) RV 140% DLCO uncorrected 51%  01/2020 LDCT - stable, T7 fracture  HISTORY OF PRESENT ILLNESS: She has severe COPD with FEV1 around 40%.  She quit smoking in 2015.  She has oxygen at home to use on an as-needed basis.  She had multiple flares this year including a hospitalization 07/2019 and has been maintained on steroids since  then.  Unfortunately she has a lot of osteoporosis and vertebral fractures. At her last office visit we dropped her prednisone to 5 mg with the goal of tapering this to off she remains on Symbicort and Spiriva as maintenance medications .  She had a recent visit to [place] and [place].  After returning she suddenly developed shortness of breath and presented to the emergency room on 12/18, CT angiogram was negative for pulmonary embolism Covid and flu test were negative she denies preceding URI symptoms or fevers.  She received Pneumovax 3 days prior to admission. She improved somewhat initially but when steroids were decreased, breathing worsen hence we are consulted Feels a little bit better generally  SUBJECTIVE:  States she feels better generally Does not have any pain or discomfort this morning  VITAL SIGNS: Temp:  [97.7 F (36.5 C)-97.9 F (36.6 C)] 97.9 F (36.6 C) (12/28 2376) Pulse Rate:  [74-100] 74 (12/28 0632) Resp:  [14-18] 18 (12/28 2831) BP: (140-157)/(72-86) 140/72 (12/28 0632) SpO2:  [91 %-97 %] 97 % (12/28 5176) Weight:  [60.1 kg] 60.1 kg (12/28 0500)  PHYSICAL EXAMINATION: Gen: In no acute distress, appears comfortable ENT: Moist oral mucosa Neck: No JVD, no thyromegaly Lungs: Poor air movement bilaterally, no rhonchi Cardiovascular: S1-S2 appreciated with no murmur Musculoskeletal: No deformities, no cyanosis or clubbing  Neuro -anxious affect, mild tremors  CT scan of the chest reviewed by myself 12/27 -No acute infiltrate, extensive emphysema  Recent Labs  Lab 03/31/20 0506 04/01/20 0425 04/02/20 0403  NA 142 141 140  K 4.6 4.2 4.6  CL 94* 100 100  CO2 34* 30 32  BUN 38* 37* 32*  CREATININE 0.86 0.85 0.79  GLUCOSE 161* 151*  155*   Recent Labs  Lab 04/01/20 0425 04/02/20 0403 04/03/20 0538  HGB 12.8 13.3 12.9  HCT 39.5 40.8 40.1  WBC 17.2* 17.8* 19.4*  PLT 306 303 283   No results found.  ASSESSMENT / PLAN:   COPD  exacerbation -Improving -On oral steroids -On oral antibiotics -Did complete azithromycin, currently on Omnicef -Omnicef can be stopped after 3 total days - -On prednisone 20 p.o. twice daily -This can be weaned down 10 mg every 3 to 4 days to patient's maintenance dose of 5 mg daily  -Schedule outpatient follow-up with Dr. Vassie Loll who she sees regularly in the outpatient in 3 to 4 weeks  -She should follow-up with her primary care doctor for the management of osteoporosis, vertebral fracture especially with being on prednisone chronically  Virl Diamond, MD [place] [facility] Pager: [phone]

## 2020-04-03 NOTE — Plan of Care (Signed)

## 2020-04-03 NOTE — Plan of Care (Signed)

## 2020-04-03 NOTE — Progress Notes (Signed)
Patient discharged via wheelchair accompanied by staff and husband. Patient in no acute distress, no complaints. On O2 nasal canula at 1L (home setting) EVS discussed with patient, discharge instruction given, pt. Verbalized understanding. All belongings are with patient.

## 2020-04-03 NOTE — Discharge Summary (Signed)
Physician Discharge Summary  Kim [name] [id number] DOB: Jul 08, 1955 DOA: 03/24/2020  PCP: [name], NP  Admit date: 03/24/2020 Discharge date: 04/03/2020  Admitted From: Home Disposition:  Home  Discharge Condition:Stable CODE STATUS:FULL Diet recommendation: Heart Healthy   Brief/Interim Summary: Patient is a 64 year old female with COPD on chronic steroid therapy, hyperlipidemia, alpha-1 antitrypsin deficiency carrier, anxiety, depression who presented with sudden onset of shortness of breath.  She is on oxygen at home at 1 L/min.  She needed more oxygen at home before DC.  She was admitted for COPD exacerbation management.  PCCM was following and she had prolonged hospital course.  Currently her respiratory status is at baseline and she is on 1 L of oxygen per minute.  She is medically stable for discharge to home with tapering course of prednisone.  Following problems were addressed during her hospitalization:  COPD exacerbation/acute on chronic hypoxic respiratory failure: Has history of severe COPD.  On chronic steroid therapy.  Symptoms started after receiving Pneumovax shot.  She had multiple flares recently.  Presented with increased dyspnea, increased oxygen requirement.  Chest imaging did not show any PE but showed severe centrilobular emphysema with mild superimposed diffuse groundglass opacity.  Influenza/Covid screening negative.  She was initially started on IV steroids which have been changed to oral.  PCCM following. She was also on oral antibiotics.  Respiratory status has stabilized.  Pulmonology cleared for discharge.  She will follow-up with her pulmonologist as an outpatient in 2 weeks.  She is to continue tapering dose of prednisone. Continue bronchodilators at home  Frontal headache/sinus congestion: Started on fiorecet with improvement  Hypertension: Continue losartan.    Diastolic congestive heart failure: TTE on 07/11/2019 showed ejection fraction  of [id number], grade 1 diastolic dysfunction.  Currently euvolemic, takes Lasix as needed at home.  Leukocytosis: Improving  Anxiety/depression: On Paxil  History of osteoporosis/vertebral fractures: Recommend to be started on biphosphonates as an outpatient   Discharge Diagnoses:  Principal Problem:   COPD exacerbation ([place]) Active Problems:   Anxiety and depression   Chronic diastolic CHF (congestive heart failure) ([place])   Essential hypertension    Discharge Instructions  Discharge Instructions    Diet - low sodium heart healthy   Complete by: As directed    Discharge instructions   Complete by: As directed    1)Please take prescribed medication as instructed.  Take prednisone as instructed, after finishing the  tapering course,Continue taking 5 mg daily 2)Follow up with your pulmonologist as an outpatient in 2 weeks.   Increase activity slowly   Complete by: As directed      Allergies as of 04/03/2020      Reactions   Ciprofloxacin Other (See Comments)   Hallucinations    Lisinopril Cough   Penicillins Hives   Tolerates Omnicef Has patient had a PCN reaction causing immediate rash, facial/tongue/throat swelling, SOB or lightheadedness with hypotension: Yes Has patient had a PCN reaction causing severe rash involving mucus membranes or skin necrosis: No Has patient had a PCN reaction that required hospitalization No Has patient had a PCN reaction occurring within the last 10 years: No If all of the above answers are "NO", then may proceed with Cephalosporin use.      Medication List    TAKE these medications   albuterol 108 (90 Base) MCG/ACT inhaler Commonly known as: VENTOLIN HFA Take two puffs every 4-6 hours as needed for shortness of breath/wheezing . 90 day supply What changed:   how much to  take  how to take this  when to take this   ALPRAZolam 0.5 MG tablet Commonly known as: XANAX TAKE 1/2 TO 1 TABLET BY MOUTH THREE TIMES DAILY What changed: See  the new instructions.   aspirin 81 MG EC tablet Take 1 tablet (81 mg total) by mouth daily.   budesonide-formoterol 160-4.5 MCG/ACT inhaler Commonly known as: Symbicort Inhale 2 puffs into the lungs 2 (two) times daily.   butalbital-acetaminophen-caffeine 50-325-40 MG tablet Commonly known as: FIORICET Take 1 tablet by mouth every 4 (four) hours as needed for headache.   CALCIUM 1200 PO Take 1 tablet by mouth daily.   cefdinir 300 MG capsule Commonly known as: OMNICEF Take 1 capsule (300 mg total) by mouth every 12 (twelve) hours for 2 days.   cyanocobalamin 2000 MCG tablet Take 2,000 mcg by mouth daily. Vitamin B12   furosemide 40 MG tablet Commonly known as: LASIX Take 1 tablet (40 mg total) by mouth daily as needed (for weight gain of 3 lbs). What changed:   how much to take  reasons to take this   Ginkgo Biloba Extract 60 MG Caps Take 60 mg by mouth daily.   guaiFENesin 600 MG 12 hr tablet Commonly known as: MUCINEX Take 2 tablets (1,200 mg total) by mouth 2 (two) times daily for 7 days.   HYDROcodone-acetaminophen 7.5-325 MG tablet Commonly known as: Norco Take 1 tablet by mouth 2 (two) times daily as needed (pain/cough). What changed: reasons to take this   ipratropium-albuterol 0.5-2.5 (3) MG/3ML Soln Commonly known as: DUONEB USE 3 ML VIA NEBULIZER EVERY 6 HOURS AS NEEDED What changed: See the new instructions.   losartan 25 MG tablet Commonly known as: COZAAR TAKE 1 TABLET EVERY DAY   magnesium oxide 400 MG tablet Commonly known as: MAG-OX Take 400 mg by mouth daily.   nitroGLYCERIN 0.4 MG SL tablet Commonly known as: NITROSTAT Place 1 tablet (0.4 mg total) under the tongue every 5 (five) minutes as needed for chest pain.   OXYGEN Inhale 2 L into the lungs continuous.   PARoxetine 30 MG tablet Commonly known as: PAXIL Take 1 tablet (30 mg total) by mouth daily.   predniSONE 5 MG tablet Commonly known as: DELTASONE Take 1 tablet (5 mg  total) by mouth daily with breakfast. Resume after finishing tapering prednisone course What changed: additional instructions   predniSONE 10 MG tablet Commonly known as: DELTASONE Take 4 tablets (40 mg total) by mouth daily with breakfast. Please take 4 pills daily for 3 days then 3 pills daily for 3 days then 2 pills daily for 3 days then 1 pill daily for 3 days then continue taking 5 mg daily What changed: You were already taking a medication with the same name, and this prescription was added. Make sure you understand how and when to take each.   Spacer/Aero Chamber Mouthpiece Misc 1 Device by Does not apply route as directed.   Spiriva Respimat 2.5 MCG/ACT Aers Generic drug: Tiotropium Bromide Monohydrate USE 2 INHALATIONS BY MOUTH  DAILY What changed:   how much to take  how to take this  when to take this  additional instructions   VITAMIN C PO Take 1 tablet by mouth daily.   Vitamin D3 50 MCG (2000 UT) Tabs Take 2,000 Units by mouth daily.       Follow-up Information    [name], NP. Schedule an appointment as soon as possible for a visit in 1 week(s).   Specialty: Family  Medicine Contact information: [address] [place] [address] [phone]        Oretha Milch, MD. Schedule an appointment as soon as possible for a visit in 2 week(s).   Specialty: Pulmonary Disease Contact information: [address] [place] [address] [phone]              Allergies  Allergen Reactions  . Ciprofloxacin Other (See Comments)    Hallucinations   . Lisinopril Cough  . Penicillins Hives    Tolerates Omnicef Has patient had a PCN reaction causing immediate rash, facial/tongue/throat swelling, SOB or lightheadedness with hypotension: Yes Has patient had a PCN reaction causing severe rash involving mucus membranes or skin necrosis: No Has patient had a PCN reaction that required hospitalization No Has patient had a PCN  reaction occurring within the last 10 years: No If all of the above answers are "NO", then may proceed with Cephalosporin use.     Consultations:  PCCM   Procedures/Studies: CT ANGIO CHEST PE W OR WO CONTRAST  Result Date: 03/24/2020 CLINICAL DATA:  Shortness of breath.  History of COPD. EXAM: CT ANGIOGRAPHY CHEST WITH CONTRAST TECHNIQUE: Multidetector CT imaging of the chest was performed using the standard protocol during bolus administration of intravenous contrast. Multiplanar CT image reconstructions and MIPs were obtained to evaluate the vascular anatomy. CONTRAST:  OMNIPAQUE IOHEXOL 350 MG/ML SOLN COMPARISON:  January 31, 2020 FINDINGS: Cardiovascular: Satisfactory opacification of the pulmonary arteries to the segmental level. No evidence of pulmonary embolism. Normal heart size. No pericardial effusion. LEFT-sided coronary artery atherosclerotic calcifications. Scattered atherosclerotic calcifications throughout the aorta. Mediastinum/Nodes: No enlarged mediastinal, hilar, or axillary lymph nodes. Thyroid gland, trachea, and esophagus demonstrate no significant findings. Lungs/Pleura: No pleural effusion or pneumothorax. Severe centrilobular emphysema. There is mild superimposed diffuse ground-glass opacity with an apical predominance, similar in comparison to prior. Mild paraseptal emphysema. Biapical scarring, unchanged. Scattered areas of endobronchial debris. Mild bronchial wall thickening. Scattered tiny pulmonary nodules mild representative RIGHT lower lobe 2 mm pulmonary nodule, unchanged (series 5, image 139). Lingular pulmonary nodule measures 4 x 2 mm, unchanged (series 5, image 201). Upper Abdomen: Unchanged 12 mm RIGHT adrenal nodule since at least 2017, most consistent with a benign etiology. Musculoskeletal: Chronic moderate compression fracture deformity of T7, unchanged. New mild compression fracture deformity of L1 since October of 2021. Review of the MIP images confirms  the above findings. IMPRESSION: 1. No evidence of pulmonary embolism. 2. Severe centrilobular emphysema with mild superimposed diffuse ground-glass opacity with an apical predominance, similar in comparison to prior study. Findings likely reflect smoking related lung disease/respiratory bronchiolitis. 3. New mild compression fracture deformity of L1 since October of 2021. 4. Unchanged scattered pulmonary nodules. Recommend continued annual lung cancer screening. Aortic Atherosclerosis (ICD10-I70.0) and Emphysema (ICD10-J43.9). Electronically Signed   By: Meda Klinefelter MD   On: 03/24/2020 15:14   DG Chest Portable 1 View  Result Date: 03/24/2020 CLINICAL DATA:  Dyspnea, COPD EXAM: PORTABLE CHEST 1 VIEW COMPARISON:  09/17/2019 chest radiograph. FINDINGS: Stable cardiomediastinal silhouette with normal heart size. No pneumothorax. No pleural effusion. Hyperinflated lungs. No pulmonary edema. No acute consolidative airspace disease. IMPRESSION: 1. No acute cardiopulmonary disease. 2. Hyperinflated lungs, compatible with reported COPD. Electronically Signed   By: Delbert Phenix M.D.   On: 03/24/2020 10:26       Subjective: Patient seen and examined at the bedside this morning.  Hemodynamically stable for discharge today.  Discharge Exam: Vitals:  04/03/20 0632 04/03/20 0828  BP: 140/72   Pulse: 74   Resp: 18   Temp: 97.9 F (36.6 C)   SpO2: 97% 97%   Vitals:   04/03/20 0029 04/03/20 0500 04/03/20 0632 04/03/20 0828  BP:   140/72   Pulse:   74   Resp:   18   Temp:   97.9 F (36.6 C)   TempSrc:   Oral   SpO2: 95%  97% 97%  Weight:  60.1 kg    Height:        General: Pt is alert, awake, not in acute distress Cardiovascular: RRR, S1/S2 +, no rubs, no gallops Respiratory: Mild expiratory wheezes bilaterally, no rhonchi Abdominal: Soft, NT, ND, bowel sounds + Extremities: no edema, no cyanosis    The results of significant diagnostics from this hospitalization (including  imaging, microbiology, ancillary and laboratory) are listed below for reference.     Microbiology: Recent Results (from the past 240 hour(s))  Expectorated sputum assessment w rflx to resp cult     Status: None   Collection Time: 03/28/20  3:39 PM   Specimen: Expectorated Sputum  Result Value Ref Range Status   Specimen Description EXPECTORATED SPUTUM  Final   Special Requests NONE  Final   Sputum evaluation   Final    THIS SPECIMEN IS ACCEPTABLE FOR SPUTUM CULTURE Performed at [facility], [place] [address], [place], [place] [address]    Report Status 03/28/2020 FINAL  Final  Culture, respiratory     Status: None   Collection Time: 03/28/20  3:39 PM  Result Value Ref Range Status   Specimen Description   Final    EXPECTORATED SPUTUM Performed at [facility] [address] [place], [place] [address]    Special Requests   Final    NONE Reflexed from [id number] Performed at [facility], [place] [address], [place], [place] [address]    Gram Stain   Final    FEW WBC PRESENT, PREDOMINANTLY PMN RARE SQUAMOUS EPITHELIAL CELLS PRESENT ABUNDANT GRAM POSITIVE COCCI RARE YEAST    Culture   Final    FEW Normal respiratory flora-no Staph aureus or Pseudomonas seen Performed at [facility], [address] [place], [place] [address]    Report Status 03/31/2020 FINAL  Final  MRSA PCR Screening     Status: None   Collection Time: 03/29/20 12:26 PM   Specimen: Nasopharyngeal  Result Value Ref Range Status   MRSA by PCR NEGATIVE NEGATIVE Final    Comment:        The GeneXpert MRSA Assay (FDA approved for NASAL specimens only), is one component of a comprehensive MRSA colonization surveillance program. It is not intended to diagnose MRSA infection nor to guide or monitor treatment for MRSA infections. Performed at [facility], [place] [address], [place],  [address]      Labs: BNP (last 3  results) Recent Labs    07/10/19 1915 09/17/19 1621  BNP 42.4 0000000   Basic Metabolic Panel: Recent Labs  Lab 03/29/20 0527 03/30/20 0503 03/31/20 0506 04/01/20 0425 04/02/20 0403  NA 143 141 142 141 140  K 4.6 4.7 4.6 4.2 4.6  CL 99 99 94* 100 100  CO2 33* 31 34* 30 32  GLUCOSE 134* 153* 161* 151* 155*  BUN 32* 36* 38* 37* 32*  CREATININE 0.82 0.78 0.86 0.85 0.79  CALCIUM 9.1 8.9 9.2 8.7* 8.8*  MG  --  2.5*  --   --   --  Liver Function Tests: Recent Labs  Lab 03/30/20 0503  AST 20  ALT 28  ALKPHOS 48  BILITOT 0.3  PROT 5.7*  ALBUMIN 3.4*   No results for input(s): LIPASE, AMYLASE in the last 168 hours. No results for input(s): AMMONIA in the last 168 hours. CBC: Recent Labs  Lab 03/29/20 0527 03/30/20 0503 04/01/20 0425 04/02/20 0403 04/03/20 0538  WBC 14.3* 15.3* 17.2* 17.8* 19.4*  NEUTROABS  --  13.4* 15.3* 15.7* 15.4*  HGB 13.5 13.4 12.8 13.3 12.9  HCT 41.8 41.6 39.5 40.8 40.1  MCV 95.7 95.2 94.3 95.8 95.5  PLT 337 337 306 303 283   Cardiac Enzymes: No results for input(s): CKTOTAL, CKMB, CKMBINDEX, TROPONINI in the last 168 hours. BNP: Invalid input(s): POCBNP CBG: No results for input(s): GLUCAP in the last 168 hours. D-Dimer No results for input(s): DDIMER in the last 72 hours. Hgb A1c No results for input(s): HGBA1C in the last 72 hours. Lipid Profile No results for input(s): CHOL, HDL, LDLCALC, TRIG, CHOLHDL, LDLDIRECT in the last 72 hours. Thyroid function studies No results for input(s): TSH, T4TOTAL, T3FREE, THYROIDAB in the last 72 hours.  Invalid input(s): FREET3 Anemia work up No results for input(s): VITAMINB12, FOLATE, FERRITIN, TIBC, IRON, RETICCTPCT in the last 72 hours. Urinalysis    Component Value Date/Time   COLORURINE yellow 02/26/2010 0951   APPEARANCEUR Clear 02/26/2010 0951   LABSPEC 1.025 02/26/2010 0951   PHURINE 5.0 02/26/2010 0951   HGBUR trace-lysed 02/26/2010 0951   BILIRUBINUR Negative 01/14/2018 0859    PROTEINUR Negative 01/14/2018 0859   UROBILINOGEN 0.2 01/14/2018 0859   UROBILINOGEN 0.2 02/26/2010 0951   NITRITE Negative 01/14/2018 0859   NITRITE negative 02/26/2010 0951   LEUKOCYTESUR Negative 01/14/2018 0859   Sepsis Labs Invalid input(s): PROCALCITONIN,  WBC,  LACTICIDVEN Microbiology Recent Results (from the past 240 hour(s))  Expectorated sputum assessment w rflx to resp cult     Status: None   Collection Time: 03/28/20  3:39 PM   Specimen: Expectorated Sputum  Result Value Ref Range Status   Specimen Description EXPECTORATED SPUTUM  Final   Special Requests NONE  Final   Sputum evaluation   Final    THIS SPECIMEN IS ACCEPTABLE FOR SPUTUM CULTURE Performed at [facility], [place] [address], [place], [place] [address]    Report Status 03/28/2020 FINAL  Final  Culture, respiratory     Status: None   Collection Time: 03/28/20  3:39 PM  Result Value Ref Range Status   Specimen Description   Final    EXPECTORATED SPUTUM Performed at [facility] [address] [place], [place] [address]    Special Requests   Final    NONE Reflexed from [id number] Performed at [facility], [place] [address] [place], [place] [address]    Gram Stain   Final    FEW WBC PRESENT, PREDOMINANTLY PMN RARE SQUAMOUS EPITHELIAL CELLS PRESENT ABUNDANT GRAM POSITIVE COCCI RARE YEAST    Culture   Final    FEW Normal respiratory flora-no Staph aureus or Pseudomonas seen Performed at [facility], [address] [place], [place] [address]    Report Status 03/31/2020 FINAL  Final  MRSA PCR Screening     Status: None   Collection Time: 03/29/20 12:26 PM   Specimen: Nasopharyngeal  Result Value Ref Range Status   MRSA by PCR NEGATIVE NEGATIVE Final    Comment:        The GeneXpert MRSA Assay (  FDA approved for NASAL specimens only), is one component of a comprehensive MRSA colonization surveillance program. It is not intended to  diagnose MRSA infection nor to guide or monitor treatment for MRSA infections. Performed at [facility], [address] [place], [place] [address]     Please note: You were cared for by a hospitalist during your hospital stay. Once you are discharged, your primary care physician will handle any further medical issues. Please note that NO REFILLS for any discharge medications will be authorized once you are discharged, as it is imperative that you return to your primary care physician (or establish a relationship with a primary care physician if you do not have one) for your post hospital discharge needs so that they can reassess your need for medications and monitor your lab values.    Time coordinating discharge: 40 minutes  SIGNED:   Burnadette Pop, MD  [facility] 04/03/2020, 11:05 AM Pager [phone]  If 7PM-7AM, please contact night-coverage [email or web address] Password [email or web address]

## 2020-04-04 ENCOUNTER — Other Ambulatory Visit: Payer: Self-pay

## 2020-04-04 DIAGNOSIS — J9611 Chronic respiratory failure with hypoxia: Secondary | ICD-10-CM

## 2020-04-04 DIAGNOSIS — J449 Chronic obstructive pulmonary disease, unspecified: Secondary | ICD-10-CM

## 2020-04-04 MED ORDER — IPRATROPIUM-ALBUTEROL 0.5-2.5 (3) MG/3ML IN SOLN: RESPIRATORY_TRACT | 5 refills | Status: DC

## 2020-04-05 ENCOUNTER — Telehealth: Payer: Self-pay

## 2020-04-05 NOTE — Telephone Encounter (Cosign Needed)
Transition Care Management Unsuccessful Follow-up Telephone Call  Date of discharge and from where:  [facility] 04/03/20  Attempts:  1st Attempt  Reason for unsuccessful TCM follow-up call:  Left voice message

## 2020-04-11 ENCOUNTER — Other Ambulatory Visit: Payer: Self-pay

## 2020-04-12 ENCOUNTER — Ambulatory Visit (INDEPENDENT_AMBULATORY_CARE_PROVIDER_SITE_OTHER): Payer: Medicare Other | Admitting: Adult Health

## 2020-04-12 ENCOUNTER — Encounter: Payer: Self-pay | Admitting: Adult Health

## 2020-04-12 VITALS — BP 118/78 | Temp 98.1°F | Wt 127.0 lb

## 2020-04-12 DIAGNOSIS — J449 Chronic obstructive pulmonary disease, unspecified: Secondary | ICD-10-CM | POA: Diagnosis not present

## 2020-04-12 DIAGNOSIS — F419 Anxiety disorder, unspecified: Secondary | ICD-10-CM

## 2020-04-12 DIAGNOSIS — M8008XA Age-related osteoporosis with current pathological fracture, vertebra(e), initial encounter for fracture: Secondary | ICD-10-CM | POA: Diagnosis not present

## 2020-04-12 DIAGNOSIS — I10 Essential (primary) hypertension: Secondary | ICD-10-CM | POA: Diagnosis not present

## 2020-04-12 DIAGNOSIS — I5032 Chronic diastolic (congestive) heart failure: Secondary | ICD-10-CM | POA: Diagnosis not present

## 2020-04-12 DIAGNOSIS — F32A Depression, unspecified: Secondary | ICD-10-CM

## 2020-04-12 MED ORDER — ALENDRONATE SODIUM 70 MG PO TABS: 70.0000 mg | ORAL_TABLET | ORAL | 3 refills | Status: DC

## 2020-04-12 NOTE — Progress Notes (Signed)
Subjective:    Patient ID: Kim [name], female    DOB: 1955/12/07, 65 y.o.   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  She presents with her husband today for TCM visit  Admit Date 03/24/2020 Discharge Date  04/03/2020   She presented to the emergency room with sudden onset of shortness of breath.  She was admitted for COPD exacerbation management.  Hospital Course  1. COPD exacerbation/acute on chronic hypoxic respiratory failure He has a history of severe COPD.  On chronic steroid therapy.  Symptoms started after receiving Pneumovax shot.  She has had multiple flares recently.  She presented with increased dyspnea and increased oxygen requirement.  Chest imaging did not show PE but showed severe emphysema which was mildly superimposed diffuse groundglass opacity.  Her influenza/COVID screening was negative.  She was initially started on IV steroids which were changed to oral.  She was also on multiple oral antibiotics while in the hospital.  Her respiratory status stabilized and pulmonary cleared for discharge.  She was advised to follow-up with her pulmonologist as an outpatient in 2 weeks.  Continue tapering dose of prednisone.  Continue bronchodilators at home  2. frontal headache/sinus congestion -Noted on Fioricet with improvement.  This is since resolved  3. Hypertension  - Continued on Losartan 25 mg   4.  Diastolic congestive heart failure -TTE on 07/11/2019 showed EF of 60 to 65%, grade 1 diastolic dysfunction.  Currently euvolemic, takes Lasix at home as needed  5. Leukocytosis - improved upon discharge   6. Anxiety/Depression  - On Paxil   7.  History of osteoporosis/vertebral fractures -Recommended  to be started on biphosphonate's and or PTH therapy as outpatient  Today on follow-up she does report that her breathing has improved.  She has not been having to use oxygen therapy at home and her pulse ox has been staying between 95 and 96%.  She continues to be on 20 mg of prednisone daily.  At home she reports that she started to periods hypotension symptoms of dizziness when standing.  She was monitoring her blood pressure and continues to do so.  With standing her blood pressure is dropping into the 90s systolic, while resting blood pressures in the 120s systolic.  She has stopped her Lotensin over the last week, but has not had much relief from dizziness since.  She does report that she is staying well-hydrated.  Has only had to take her Lasix once this week.  Additionally, she feels confused at times as well as foggy headed and just feels "like my brain is not working".  She contributes this to high-dose steroids and everything that went on in the hospital.  Review of Systems  Constitutional: Positive for fatigue.  HENT: Negative.   Eyes: Negative.   Respiratory: Positive for shortness of breath (chronic).   Cardiovascular: Negative.   Gastrointestinal: Negative.   Endocrine: Negative.   Genitourinary: Negative.   Musculoskeletal: Negative.   Skin: Negative.   Allergic/Immunologic: Negative.   Neurological: Positive for dizziness and weakness.  Hematological: Negative.   Psychiatric/Behavioral: Negative.    Past Medical History:  Diagnosis Date  . C8 RADICULOPATHY 06/05/2009  . COPD 08/31/2007  . DEPRESSION 11/19/2006  . DYSLIPIDEMIA 03/12/2009  . MI (mitral incompetence)   . 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 OSTEOARTHRITIS 06/05/2009  .  PARESTHESIA 08/24/2007  . TOBACCO ABUSE 01/25/2010    Social History   Socioeconomic History  . Marital status: Married    Spouse name: [name]  . Number of children: 2  . Years of education: Not on file  . Highest education level: Not on file   Occupational History  . Occupation: Patient care aide-sits with elderly lady  Tobacco Use  . Smoking status: Former Smoker    Packs/day: 2.00    Years: 38.00    Pack years: 76.00    Types: Cigarettes    Quit date: 03/07/2014    Years since quitting: 6.1  . Smokeless tobacco: Never Used  Vaping Use  . Vaping Use: Never used  Substance and Sexual Activity  . Alcohol use: No    Alcohol/week: 0.0 standard drinks  . Drug use: No  . Sexual activity: Not on file  Other Topics Concern  . Not on file  Social History Narrative   [facility] (01/12/17):   Originally from [facility]. Has lived in [place] for 17 years. Married. Has 2 dogs currently. No mold and only 1 indoor plant. Had her home professionally cleaned a month ago. No bird or hot tub exposure. 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    Social Determinants of Health   Financial Resource Strain: Low Risk   . Difficulty of Paying Living Expenses: Not hard at all  Food Insecurity: No Food Insecurity  . Worried About Charity fundraiser in the Last Year: Never true  . Ran Out of Food in the Last Year: Never true  Transportation Needs: No Transportation Needs  . Lack of Transportation (Medical): No  . Lack of Transportation (Non-Medical): No  Physical Activity: Inactive  . Days of Exercise per Week: 0 days  . Minutes of Exercise per Session: 0 min  Stress: No Stress Concern Present  . Feeling of Stress : Not at all  Social Connections: Moderately Integrated  . Frequency of Communication with Friends and Family: More than three times a week  . Frequency of Social Gatherings with Friends and Family: More than three times a week  . Attends Religious Services: More than 4 times per year  . Active Member of Clubs or Organizations: No  . Attends Archivist Meetings: Never  . Marital Status: Married  Human resources officer Violence: Not At Risk  . Fear of Current or Ex-Partner: No  . Emotionally  Abused: No  . Physically Abused: No  . Sexually Abused: No    Past Surgical History:  Procedure Laterality Date  . ABDOMINAL HYSTERECTOMY    . APPENDECTOMY  1969  . CARDIAC CATHETERIZATION N/A 07/05/2015   Procedure: Left Heart Cath and Coronary Angiography;  Surgeon: Leonie Man, MD;  Location: [facility];  Service: Cardiovascular;  Laterality: N/A;  . CHOLECYSTECTOMY  1989  . collapse lung  1990  . COLONOSCOPY WITH PROPOFOL N/A 05/14/2018   Procedure: COLONOSCOPY WITH PROPOFOL;  Surgeon: Irene Shipper, MD;  Location: [facility];  Service: Endoscopy;  Laterality: N/A;  . EXCISION MASS ABDOMINAL  2019  . EXPLORATORY LAPAROTOMY  1969  . POLYPECTOMY  05/14/2018   Procedure: POLYPECTOMY;  Surgeon: Irene Shipper, MD;  Location: [facility];  Service: Endoscopy;;  . SALPINGOOPHORECTOMY  2019    Family History  Problem Relation Age of Onset  . Cancer Mother        lung  . Heart attack Mother   . Hypertension Sister   .  Multiple sclerosis Brother   . Diabetes Sister   . Rheum arthritis Sister   . Thyroid disease Sister   . Multiple sclerosis Other   . Alcohol abuse Other   . Arthritis Other   . Diabetes Other   . Kidney disease Other   . Cancer Other        lung,ovarian,skin, uterine  . Stroke Other   . Heart disease Other   . Melanoma Other   . Heart attack Maternal Uncle   . Heart attack Other        NEICE  . Heart attack Other        NEPHEW  . Hypertension Brother   . Stroke Maternal Grandmother   . Colon cancer Neg Hx     Allergies  Allergen Reactions  . Ciprofloxacin Other (See Comments)    Hallucinations   . Lisinopril Cough  . Penicillins Hives    Tolerates Omnicef Has patient had a PCN reaction causing immediate rash, facial/tongue/throat swelling, SOB or lightheadedness with hypotension: Yes Has patient had a PCN reaction causing severe rash involving mucus membranes or skin necrosis: No Has patient had a PCN reaction that required  hospitalization No Has patient had a PCN reaction occurring within the last 10 years: No If all of the above answers are "NO", then may proceed with Cephalosporin use.     Current Outpatient Medications on File Prior to Visit  Medication Sig Dispense Refill  . albuterol (VENTOLIN HFA) 108 (90 Base) MCG/ACT inhaler Take two puffs every 4-6 hours as needed for shortness of breath/wheezing . 90 day supply (Patient taking differently: Inhale 2 puffs into the lungs See admin instructions. Take two puffs every 4-6 hours as needed for shortness of breath/wheezing . 90 day supply) 162 g 1  . ALPRAZolam (XANAX) 0.5 MG tablet TAKE 1/2 TO 1 TABLET BY MOUTH THREE TIMES DAILY 60 tablet 2  . aspirin EC 81 MG EC tablet Take 1 tablet (81 mg total) by mouth daily.    . budesonide-formoterol (SYMBICORT) 160-4.5 MCG/ACT inhaler Inhale 2 puffs into the lungs 2 (two) times daily. 30.6 g 6  . furosemide (LASIX) 40 MG tablet Take 1 tablet (40 mg total) by mouth daily as needed (for weight gain of 3 lbs). (Patient taking differently: Take 20-40 mg by mouth daily as needed (for overnight weight gain of 3 lbs 1/2 tablet (20 mg), for overnight weight gain of 5 lbs 1 tablet (40 mg)).) 90 tablet 1  . ipratropium-albuterol (DUONEB) 0.5-2.5 (3) MG/3ML SOLN USE 3 ML VIA NEBULIZER EVERY 6 HOURS AS NEEDED.  DX: J44.9 360 mL 5  . magnesium oxide (MAG-OX) 400 MG tablet Take 400 mg by mouth daily.    . nitroGLYCERIN (NITROSTAT) 0.4 MG SL tablet Place 1 tablet (0.4 mg total) under the tongue every 5 (five) minutes as needed for chest pain. 25 tablet 3  . OXYGEN Inhale 2 L into the lungs continuous.    Marland Kitchen PARoxetine (PAXIL) 30 MG tablet Take 1 tablet (30 mg total) by mouth daily. 90 tablet 1  . predniSONE (DELTASONE) 10 MG tablet Take 4 tablets (40 mg total) by mouth daily with breakfast. Please take 4 pills daily for 3 days then 3 pills daily for 3 days then 2 pills daily for 3 days then 1 pill daily for 3 days then continue taking 5 mg  daily 30 tablet 0  . predniSONE (DELTASONE) 5 MG tablet Take 1 tablet (5 mg total) by mouth daily with breakfast. Resume  after finishing tapering prednisone course 60 tablet 0  . Spacer/Aero Chamber Mouthpiece MISC 1 Device by Does not apply route as directed. 1 each 0  . Tiotropium Bromide Monohydrate (SPIRIVA RESPIMAT) 2.5 MCG/ACT AERS USE 2 INHALATIONS BY MOUTH  DAILY (Patient taking differently: Inhale 2 puffs into the lungs daily at 12 noon.) 12 g 3  . Ascorbic Acid (VITAMIN C PO) Take 1 tablet by mouth daily. (Patient not taking: Reported on 04/12/2020)    . Calcium Carbonate-Vit D-Min (CALCIUM 1200 PO) Take 1 tablet by mouth daily. (Patient not taking: Reported on 04/12/2020)    . Cholecalciferol (VITAMIN D3) 50 MCG (2000 UT) TABS Take 2,000 Units by mouth daily. (Patient not taking: Reported on 04/12/2020)    . cyanocobalamin 2000 MCG tablet Take 2,000 mcg by mouth daily. Vitamin B12 (Patient not taking: Reported on 04/12/2020)    . Ginkgo Biloba Extract 60 MG CAPS Take 60 mg by mouth daily. (Patient not taking: Reported on 04/12/2020)    . HYDROcodone-acetaminophen (NORCO) 7.5-325 MG tablet Take 1 tablet by mouth 2 (two) times daily as needed (pain/cough). (Patient not taking: Reported on 04/12/2020) 45 tablet 0  . losartan (COZAAR) 25 MG tablet TAKE 1 TABLET EVERY DAY (Patient not taking: Reported on 04/12/2020) 90 tablet 1   No current facility-administered medications on file prior to visit.    BP 118/78   Temp 98.1 F (36.7 C)   Wt 127 lb (57.6 kg)   BMI 22.50 kg/m       Objective:   Physical Exam Vitals and nursing note reviewed.  Constitutional:      Appearance: Normal appearance.  Cardiovascular:     Rate and Rhythm: Normal rate and regular rhythm.     Pulses: Normal pulses.     Heart sounds: Normal heart sounds.  Pulmonary:     Effort: Pulmonary effort is normal.     Breath sounds: Normal breath sounds.  Musculoskeletal:        General: Normal range of motion.  Skin:     General: Skin is warm and dry.  Neurological:     General: No focal deficit present.     Mental Status: She is alert and oriented to person, place, and time.  Psychiatric:        Attention and Perception: Attention and perception normal.        Mood and Affect: Mood is anxious and depressed. Affect is tearful.        Speech: Speech normal.        Behavior: Behavior normal.        Thought Content: Thought content normal.        Cognition and Memory: Cognition and memory normal.        Judgment: Judgment normal.     Comments: No overt cognitive disorder noted, she did have mild word finding issues at times but was able to rebound without difficulty.        Assessment & Plan:  1. COPD GOLD III  -Back to baseline.  Continue with pulmonary plan of care.  Follow-up with pulmonary as directed  2. Chronic diastolic CHF (congestive heart failure) (HCC) -Euvolemic today.  Continue with Lasix as needed.  3. Essential hypertension -BP  118's over 70s to 80s during multiple checks in the office today.  We will have her continue to be off Lotensin for the time being.  Continue to monitor blood pressures at home, follow-up if dizziness and hypotension continues over the next week  4.  Fracture of vertebra due to osteoporosis, initial encounter [facility] -Per pulmonary commendations we will start her on Fosamax and refer to endocrinology for PTH therapy - alendronate (FOSAMAX) 70 MG tablet; Take 1 tablet (70 mg total) by mouth every 7 (seven) days. Take with a full glass of water on an empty stomach.  Dispense: 12 tablet; Refill: 3 - Ambulatory referral to Endocrinology  5. Anxiety and depression -Tearful at times during the exam.  Continue her with Paxil.  Hold off on increasing at this time   [name], NP

## 2020-04-17 ENCOUNTER — Inpatient Hospital Stay: Payer: Medicare Other | Admitting: Primary Care

## 2020-04-18 ENCOUNTER — Ambulatory Visit (INDEPENDENT_AMBULATORY_CARE_PROVIDER_SITE_OTHER): Payer: Medicare Other | Admitting: Primary Care

## 2020-04-18 ENCOUNTER — Encounter: Payer: Self-pay | Admitting: Primary Care

## 2020-04-18 ENCOUNTER — Other Ambulatory Visit: Payer: Self-pay

## 2020-04-18 DIAGNOSIS — J449 Chronic obstructive pulmonary disease, unspecified: Secondary | ICD-10-CM | POA: Diagnosis not present

## 2020-04-18 DIAGNOSIS — J9611 Chronic respiratory failure with hypoxia: Secondary | ICD-10-CM

## 2020-04-18 DIAGNOSIS — M8008XA Age-related osteoporosis with current pathological fracture, vertebra(e), initial encounter for fracture: Secondary | ICD-10-CM

## 2020-04-18 MED ORDER — BREZTRI AEROSPHERE 160-9-4.8 MCG/ACT IN AERO: 2.0000 | INHALATION_SPRAY | Freq: Two times a day (BID) | RESPIRATORY_TRACT | 0 refills | Status: DC

## 2020-04-18 NOTE — Assessment & Plan Note (Signed)
-   Following with Dr. Maxie Better - CTA on 03/24/20 showed new mild compression fracture deformity of L1

## 2020-04-18 NOTE — Assessment & Plan Note (Signed)
-   Continue baseline oxygen 2L on exertion to maintain O2 >88-90%

## 2020-04-18 NOTE — Progress Notes (Signed)
@Patient  ID: Kim [name], female    DOB: [birth year], 65 y.o.   MRN: [id number]  Chief Complaint  Patient presents with  . Hospitalization Follow-up    Referring provider: [name], NP  HPI: 65 year old female, former smoker quit 2015 (76 pack year hx). PMH significant for COPD GOLD III, chronic respiratory failure, allergic rhinitis, coronary artery calcification, HLD, benign neoplasm ascending colon. Patient of Dr. Elsworth Soho. Maintained on Symbicort 160 and Spiriva Respimat 2.77mcg. Eosinophils 900 in April 2021.   09/2019>>low-dose steroids -tapered to off byend August.  01/2020 OV >> pred40 mg, settling at 10 02/2020 OV >> decrease to 5 mg with the goal of getting her off steroids completely within the next 2 months  COPD exacerbations: June 2020- COPD exacerbation/Prednisone + zpack September- prednisone PCP December 2020- Prednisone + zpack ? PCP January 18th 2021- COPD exacerbation/Prednisone + doxycycline  February 5th 2021- COPD exacerbation/Prednisone + Zpack  February 9th 2021- Prolonged COPD exacerbation requiring high dose prednisone/ CXR ordered  April 4th- Hospitalized for COPD exacerbation treated with IV steriods and oral prednisone taper 08/12/2019 - Zpack and extrended prednisone taper  09/17/19- ED, given Doxycycline which was changed to [facility]. [place] held. Started on prednisone 20mg , taper over 1-2 months.  September 06, 2019- Started on [place] 248mcg, mild effects wants to continue  12/14/19- COPD exacerbation treated telephonically with doxycyline and prednsione 03/24/20- Hospitalized for 10 days d/t COPD exacerbation  Previous [facility] pulmonary encounter: 09/20/19 Reviewed office visits from 08/2019, many recent exacerbations She felt a little bit improved but had an ED visit on 6/12 -reviewed ED stay, given Solu-Medrol IV and then doxy and prednisone taper, chest x-ray did not show any infiltrates. She is worried about side effects of Daliresp, she has been  taking this for 2 weeks. Uses albuterol nebs 3 times daily, complains of increased tremors. 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 Given Depo-medrol injection 80mg  IM. She was instructed to hold Daliresp for 2 weeks and then resume d/t possible side effects from medication. CXR on 6/12 reviewed which showed no evidence of pneumonia. Ordered for sputum culture. May consider azithromycin for chronic suppression if no infective cause identified. She is to stya on prednisone 20mg  until follow-up and then will need to extended course over the next 1-2 months.   States that she is doing so much better. Continues Symbicort + Spiriva. She uses 2L POC as needed, currently off it today but has it with her. She would like to try to resume Daliresp 250mg . She had previously experienced stomach pain, tremor, HA while on medication but was acutely ill at that time as well. She had an EKG in the ED.   11/04/2019 Patient presents today for 1 month follow-up visit. Accompanied by her husband. She is doing fairly well. No exacerbations since last visit in June. Started on Daliresp 230mcg in June to help decreased frequency of COPD exacerbation. She is having some mild side effects including tremor that last for 15 mins or less with medication but wants to continue taking. She is on prednisone 15mg , plan to taper off. She takes generic zyrtec. She has an upcoming vacation with her girlfriends to [place] for 5  days and feels well enough to go. She has a chronic productive cough with beige mucus. No fevers, chills, sweats, chest tightness, wheezing or purulent mucus.   01/09/20- Dr. Elsworth Soho On her last visit 09/2019, we  placed her on low-dose steroids for a prolonged.,  This was tapered to off end of August.  She returns now with increased dyspnea on exertion. Cough is productive of clearish phlegm Intermittent wheezing, using nebs twice daily in addition to Symbicort and Spiriva with which she is compliant. 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  No other exposures in the environment. She has a 3 year old dog at home, has been chews, nobody smokes around her Chest x-ray 09/2019 independently reviewed, hyperinflation, no new infiltrate  03/05/20- Dr. Elsworth Soho She remains on 10 mg of prednisone. She developed sudden onset back pain last week, she was moving a heavy disabled child, went to see orthopedics.  Reviewed CT scan which shows T7 tibial fracture but pain appears to be in the low back area.  Dr. Maxie Better felt it could be another fracture. Breathing has been good and 10 mg of prednisone.  Saturation 93% on room air today  04/18/2020  - Interim hx Patient presents today for hospital follow-up. She was hospitalized from 03/24/20-04/03/20 for COPD exacerbation. She is feeling better today. Feels pneumonia vaccine is what triggered this most recent exacerbation. Completed azithromycin and Omnicef course. She remains on Symbicort and Spiriva. While she was on nebulizer's in the hospital she tells me that Pulmicort caused her to have panic attack, she needed to take xanax prior to counter act those side effects. She is on 2L oxygen which is baseline for her. Currently on 5mg  prednisone daily, goal is to taper off in the next 2 months. She has not been taking her Losartan d/t low BP and dizziness at home. She saw her PCP yesterday and they are aware. BP today by provider check after sitting for 10-15 mins was 110/58.  States that Spiriva is costing her between 300-500 dollars. She does not think that she would qualify for patient assistance.   Studies: CT  angiogram chest 03/24/2020  Severe centrilobular emphysema with mild superimposed diffuse ground-glass opacity with an apical predominance, similar in comparison to prior study. Findings likely reflect smoking related lung disease/respiratory bronchiolitis. New mild compression fracture deformity of L1 since 10/ 2021  11/2014: FVC 2.00 L (63%) FEV1 1.00 L (41%)ratio50 negative bronchodilator response TLC 5.06 L (103%) RV 140% DLCO uncorrected 51%   Allergies  Allergen Reactions  . Ciprofloxacin Other (See Comments)    Hallucinations   . Lisinopril Cough  . Penicillins Hives    Tolerates Omnicef Has patient had a PCN reaction causing immediate rash, facial/tongue/throat swelling, SOB or lightheadedness with hypotension: Yes Has patient had a PCN reaction causing severe rash involving mucus membranes or skin necrosis: No Has patient had a PCN reaction that required hospitalization No Has patient had a PCN reaction occurring within the last 10 years: No If all of the above answers are "NO", then may proceed with Cephalosporin use.     Immunization History  Administered Date(s) Administered  . Influenza Split 01/31/2011, 01/27/2012, 03/25/2014  . Influenza Whole 01/05/2008, 03/12/2009, 03/13/2010  . Influenza,inj,Quad PF,6+ Mos 12/29/2012, 12/20/2014, 01/07/2017, 12/17/2017, 12/17/2018, 01/09/2020  . Influenza-Unspecified 01/31/2011, 01/27/2012, 12/29/2012, 03/25/2014, 12/20/2014, 02/11/2016, 01/07/2017, 12/17/2017, 12/17/2018  . PFIZER SARS-COV-2 Vaccination 06/29/2019, 07/25/2019  . Pneumococcal Conjugate-13 08/10/2015  .  Pneumococcal Polysaccharide-23 05/08/2006, 03/22/2020  . Tdap 01/31/2011    Past Medical History:  Diagnosis Date  . C8 RADICULOPATHY 06/05/2009  . COPD 08/31/2007  . DEPRESSION 11/19/2006  . DYSLIPIDEMIA 03/12/2009  . MI (mitral incompetence)   . 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 OSTEOARTHRITIS 06/05/2009  . PARESTHESIA 08/24/2007  . TOBACCO ABUSE 01/25/2010     Tobacco History: Social History   Tobacco Use  Smoking Status Former Smoker  . Packs/day: 2.00  . Years: 38.00  . Pack years: 76.00  . Types: Cigarettes  . Quit date: 03/07/2014  . Years since quitting: 6.1  Smokeless Tobacco Never Used   Counseling given: Not Answered   Outpatient Medications Prior to Visit  Medication Sig Dispense Refill  . albuterol (VENTOLIN HFA) 108 (90 Base) MCG/ACT inhaler Take two puffs every 4-6 hours as needed for shortness of breath/wheezing . 90 day supply (Patient taking differently: Inhale 2 puffs into the lungs See admin instructions. Take two puffs every 4-6 hours as needed for shortness of breath/wheezing . 90 day supply) 162 g 1  . alendronate (FOSAMAX) 70 MG tablet Take 1 tablet (70 mg total) by mouth every 7 (seven) days. Take with a full glass of water on an empty stomach. 12 tablet 3  . ALPRAZolam (XANAX) 0.5 MG tablet TAKE 1/2 TO 1 TABLET BY MOUTH THREE TIMES DAILY 60 tablet 2  . Ascorbic Acid (VITAMIN C PO) Take 1 tablet by mouth daily.    Marland Kitchen aspirin EC 81 MG EC tablet Take 1 tablet (81 mg total) by mouth daily.    . budesonide-formoterol (SYMBICORT) 160-4.5 MCG/ACT inhaler Inhale 2 puffs into the lungs 2 (two) times daily. 30.6 g 6  . Calcium Carbonate-Vit D-Min (CALCIUM 1200 PO) Take 1 tablet by mouth daily.    . Cholecalciferol (VITAMIN D3) 50 MCG (2000 UT) TABS Take 2,000 Units by mouth daily.    . cyanocobalamin 2000 MCG tablet Take 2,000 mcg by mouth daily. Vitamin B12    . furosemide (LASIX) 40 MG tablet Take 1 tablet (40 mg total) by mouth daily as needed (for weight gain of 3 lbs). (Patient taking differently: Take 20-40 mg by mouth daily as needed (for overnight weight gain of 3 lbs 1/2 tablet (20 mg), for overnight weight gain of 5 lbs 1 tablet (40 mg)).) 90 tablet 1  . Ginkgo Biloba Extract 60 MG CAPS Take 60 mg by mouth daily.    Marland Kitchen HYDROcodone-acetaminophen (NORCO) 7.5-325 MG tablet Take 1 tablet by mouth 2 (two) times daily as  needed (pain/cough). 45 tablet 0  . ipratropium-albuterol (DUONEB) 0.5-2.5 (3) MG/3ML SOLN USE 3 ML VIA NEBULIZER EVERY 6 HOURS AS NEEDED.  DX: J44.9 360 mL 5  . magnesium oxide (MAG-OX) 400 MG tablet Take 400 mg by mouth daily.    . nitroGLYCERIN (NITROSTAT) 0.4 MG SL tablet Place 1 tablet (0.4 mg total) under the tongue every 5 (five) minutes as needed for chest pain. 25 tablet 3  . OXYGEN Inhale 2 L into the lungs continuous.    Marland Kitchen PARoxetine (PAXIL) 30 MG tablet Take 1 tablet (30 mg total) by mouth daily. 90 tablet 1  . predniSONE (DELTASONE) 10 MG tablet Take 4 tablets (40 mg total) by mouth daily with breakfast. Please take 4 pills daily for 3 days then 3 pills daily for 3 days then 2 pills daily for 3 days then 1 pill daily for 3 days then continue taking 5 mg daily 30 tablet 0  . Tiotropium Bromide Monohydrate (SPIRIVA  RESPIMAT) 2.5 MCG/ACT AERS USE 2 INHALATIONS BY MOUTH  DAILY (Patient taking differently: Inhale 2 puffs into the lungs daily at 12 noon.) 12 g 3  . losartan (COZAAR) 25 MG tablet TAKE 1 TABLET EVERY DAY (Patient not taking: Reported on 04/18/2020) 90 tablet 1  . Spacer/Aero Chamber Mouthpiece MISC 1 Device by Does not apply route as directed. 1 each 0  . predniSONE (DELTASONE) 5 MG tablet Take 1 tablet (5 mg total) by mouth daily with breakfast. Resume after finishing tapering prednisone course (Patient not taking: Reported on 04/18/2020) 60 tablet 0   No facility-administered medications prior to visit.    Review of Systems  Review of Systems  Constitutional: Negative.   HENT: Negative.   Respiratory: Positive for chest tightness and shortness of breath. Negative for cough and wheezing.   Cardiovascular: Negative.     Physical Exam  BP (!) 110/58 (BP Location: Left Arm, Patient Position: Sitting, Cuff Size: Normal)   Pulse (!) 104   Temp 98.1 F (36.7 C) (Temporal)   Ht 5\' 3"  (1.6 m)   Wt 127 lb (57.6 kg)   SpO2 95%   BMI 22.50 kg/m  Physical  Exam Constitutional:      Appearance: Normal appearance.  HENT:     Head: Normocephalic and atraumatic.  Cardiovascular:     Rate and Rhythm: Normal rate and regular rhythm.  Pulmonary:     Effort: Pulmonary effort is normal.     Breath sounds: Normal breath sounds. No wheezing, rhonchi or rales.  Neurological:     General: No focal deficit present.     Mental Status: She is alert and oriented to person, place, and time. Mental status is at baseline.  Psychiatric:        Mood and Affect: Mood normal.        Behavior: Behavior normal.        Thought Content: Thought content normal.        Judgment: Judgment normal.      Lab Results:  CBC    Component Value Date/Time   WBC 19.4 (H) 04/03/2020 0538   RBC 4.20 04/03/2020 0538   HGB 12.9 04/03/2020 0538   HGB 14.0 08/26/2016 1058   HCT 40.1 04/03/2020 0538   HCT 42.6 08/26/2016 1058   PLT 283 04/03/2020 0538   PLT 297 08/26/2016 1058   MCV 95.5 04/03/2020 0538   MCV 92 08/26/2016 1058   MCH 30.7 04/03/2020 0538   MCHC 32.2 04/03/2020 0538   RDW 14.1 04/03/2020 0538   RDW 14.1 08/26/2016 1058   LYMPHSABS 1.9 04/03/2020 0538   MONOABS 1.4 (H) 04/03/2020 0538   EOSABS 0.0 04/03/2020 0538   BASOSABS 0.0 04/03/2020 0538    BMET    Component Value Date/Time   NA 140 04/02/2020 0403   NA 146 (H) 07/01/2019 1524   K 4.6 04/02/2020 0403   CL 100 04/02/2020 0403   CO2 32 04/02/2020 0403   GLUCOSE 155 (H) 04/02/2020 0403   BUN 32 (H) 04/02/2020 0403   BUN 13 07/01/2019 1524   CREATININE 0.79 04/02/2020 0403   CREATININE 1.16 (H) 07/19/2015 1447   CALCIUM 8.8 (L) 04/02/2020 0403   GFRNONAA >60 04/02/2020 0403   GFRAA >60 09/17/2019 1621    BNP    Component Value Date/Time   BNP 52.5 09/17/2019 1621    ProBNP    Component Value Date/Time   PROBNP 43 08/26/2016 1058    Imaging: CT ANGIO CHEST PE W OR  WO CONTRAST  Result Date: 03/24/2020 CLINICAL DATA:  Shortness of breath.  History of COPD. EXAM: CT  ANGIOGRAPHY CHEST WITH CONTRAST TECHNIQUE: Multidetector CT imaging of the chest was performed using the standard protocol during bolus administration of intravenous contrast. Multiplanar CT image reconstructions and MIPs were obtained to evaluate the vascular anatomy. CONTRAST:  168mL OMNIPAQUE IOHEXOL 350 MG/ML SOLN COMPARISON:  January 31, 2020 FINDINGS: Cardiovascular: Satisfactory opacification of the pulmonary arteries to the segmental level. No evidence of pulmonary embolism. Normal heart size. No pericardial effusion. LEFT-sided coronary artery atherosclerotic calcifications. Scattered atherosclerotic calcifications throughout the aorta. Mediastinum/Nodes: No enlarged mediastinal, hilar, or axillary lymph nodes. Thyroid gland, trachea, and esophagus demonstrate no significant findings. Lungs/Pleura: No pleural effusion or pneumothorax. Severe centrilobular emphysema. There is mild superimposed diffuse ground-glass opacity with an apical predominance, similar in comparison to prior. Mild paraseptal emphysema. Biapical scarring, unchanged. Scattered areas of endobronchial debris. Mild bronchial wall thickening. Scattered tiny pulmonary nodules mild representative RIGHT lower lobe 2 mm pulmonary nodule, unchanged (series 5, image 139). Lingular pulmonary nodule measures 4 x 2 mm, unchanged (series 5, image 201). Upper Abdomen: Unchanged 12 mm RIGHT adrenal nodule since at least 2017, most consistent with a benign etiology. Musculoskeletal: Chronic moderate compression fracture deformity of T7, unchanged. New mild compression fracture deformity of L1 since October of 2021. Review of the MIP images confirms the above findings. IMPRESSION: 1. No evidence of pulmonary embolism. 2. Severe centrilobular emphysema with mild superimposed diffuse ground-glass opacity with an apical predominance, similar in comparison to prior study. Findings likely reflect smoking related lung disease/respiratory bronchiolitis. 3. New  mild compression fracture deformity of L1 since October of 2021. 4. Unchanged scattered pulmonary nodules. Recommend continued annual lung cancer screening. Aortic Atherosclerosis (ICD10-I70.0) and Emphysema (ICD10-J43.9). Electronically Signed   By: Valentino Saxon MD   On: 03/24/2020 15:14   DG Chest Portable 1 View  Result Date: 03/24/2020 CLINICAL DATA:  Dyspnea, COPD EXAM: PORTABLE CHEST 1 VIEW COMPARISON:  09/17/2019 chest radiograph. FINDINGS: Stable cardiomediastinal silhouette with normal heart size. No pneumothorax. No pleural effusion. Hyperinflated lungs. No pulmonary edema. No acute consolidative airspace disease. IMPRESSION: 1. No acute cardiopulmonary disease. 2. Hyperinflated lungs, compatible with reported COPD. Electronically Signed   By: Ilona Sorrel M.D.   On: 03/24/2020 10:26     Assessment & Plan:   COPD GOLD III  - Hospitalized for 10 days in December 2021 for prolonged COPD exacerbation. Treat with azithromycin, omnicef and IV solumedrol.  - Discharge on prednisone, patient to remain on 5mg  daily with goal to taper off in the next 2 months d.t osteoporosis.  - Recommend trial Breztri two puff twice daily in place of Symbicort/Spiriva. If patient finds this beneficial will send in prescription.  - If continues to flare may need to change to nebulized triple therapy.  - FU in 2-4 weeks with Dr. Elsworth Soho or [facility] NP.   Chronic respiratory failure (HCC) - Continue baseline oxygen 2L on exertion to maintain O2 >88-90%  Vertebral fracture, osteoporotic ([place])  - Following with Dr. Maxie Better - CTA on 03/24/20 showed new mild compression fracture deformity of L1    Martyn Ehrich, NP 04/18/2020

## 2020-04-18 NOTE — Patient Instructions (Addendum)
Glad you are doing some better since hospitalization  Recommendations: Stop Spiriva/Symbicort Start [place] - take two puffs morning and evening (1 month sample given) Continue prednisone 5mg  daily Continue 2L oxygen on exertion to keep O2 >88-90% Follow PCP instructions regarding your blood pressure   Follow-up: 2-4 weeks with Dr. Elsworth Soho preferably  Eustaquio Maize NP if not available)

## 2020-04-18 NOTE — Assessment & Plan Note (Signed)
-   Hospitalized for 10 days in December 2021 for prolonged COPD exacerbation. Treat with azithromycin, omnicef and IV solumedrol.  - Discharge on prednisone, patient to remain on 5mg  daily with goal to taper off in the next 2 months d.t osteoporosis.  - Recommend trial Breztri two puff twice daily in place of Symbicort/Spiriva. If patient finds this beneficial will send in prescription.  - If continues to flare may need to change to nebulized triple therapy.  - FU in 2-4 weeks with Dr. Elsworth Soho or [facility] NP.

## 2020-05-18 ENCOUNTER — Other Ambulatory Visit: Payer: Self-pay | Admitting: *Deleted

## 2020-05-18 ENCOUNTER — Telehealth: Payer: Self-pay | Admitting: Cardiovascular Disease

## 2020-05-18 NOTE — Telephone Encounter (Signed)
STAT if HR is under 50 or over 120 (normal HR is 60-100 beats per minute)  1. What is your heart rate? 110  2. Do you have a log of your heart rate readings (document readings)? No.  3. Do you have any other symptoms? Slight chest pains, and fluid.   Pt c/o swelling: STAT is pt has developed SOB within 24 hours  1. How much weight have you gained and in what time span? 5 lbs  2. If swelling, where is the swelling located? Hands  3. Are you currently taking a fluid pill? Yes  4. Are you currently SOB? Yes  5. Do you have a log of your daily weights (if so, list)? 05/16/20  Morning 132 lbs Evening 129 05/17/20 Morning 131.8 lbs Evening 128.6 6. Have you gained 3 pounds in a day or 5 pounds in a week? Yes.  7. Have you traveled recently? No.    Patient is calling stating that her HR has been elevated, she states that she also has noticed swelling in her hands. She would like to be worked into Dr. Camillia Herter schedule within the next few days. Please advise.

## 2020-05-18 NOTE — Telephone Encounter (Signed)
HR staying up Retaining fluid daily  Takes lasix if > 3lbs or more. Usually does not have to do this often  Took Mon Wed and Thurs because of weight being up 128-132   No swelling in feet but hands and abdomen are swollen.    Normal weights 126-129. HR - at rest 112, when she walks around 120s-140s.  These are not unusual for her.  2L O2 Doesn't add salt.  Will eat an extra banana and get some V8, and continue to take the Lasix as needed.  I have scheduled her Tue afternoon with Dr. Angelena Form.

## 2020-05-18 NOTE — Telephone Encounter (Incomplete)
STAT if HR is under 50 or over 120 (normal HR is 60-100 beats per minute)  1) What is your heart rate? 110  2) Do you have a log of your heart rate readings (document readings)? No.  3) Do you have any other symptoms? Slight chest pains, and fluid.   Pt c/o swelling: STAT is pt has developed SOB within 24 hours  1) How much weight have you gained and in what time span? ***  2) If swelling, where is the swelling located? Hands  3) Are you currently taking a fluid pill? Yes  4) Are you currently SOB? Yes  5) Do you have a log of your daily weights (if so, list)? 05/16/20  Morning 132 lbs Evening 129 05/17/20 Morning 131.8 lbs Evening 128.6 6) Have you gained 3 pounds in a day or 5 pounds in a week? Yes.  7) Have you traveled recently? No.    Patient is calling stating that her HR has been elevated, she states that she also has noticed swelling in her hands. She would like to be worked into Dr. Camillia Herter schedule within the next few days. Please advise.

## 2020-05-22 ENCOUNTER — Encounter: Payer: Self-pay | Admitting: Cardiovascular Disease

## 2020-05-22 ENCOUNTER — Ambulatory Visit (INDEPENDENT_AMBULATORY_CARE_PROVIDER_SITE_OTHER): Payer: Medicare Other | Admitting: Cardiovascular Disease

## 2020-05-22 ENCOUNTER — Other Ambulatory Visit: Payer: Self-pay

## 2020-05-22 VITALS — BP 130/62 | HR 91 | Ht 63.0 in | Wt 132.0 lb

## 2020-05-22 DIAGNOSIS — E785 Hyperlipidemia, unspecified: Secondary | ICD-10-CM | POA: Diagnosis not present

## 2020-05-22 DIAGNOSIS — I2510 Atherosclerotic heart disease of native coronary artery without angina pectoris: Secondary | ICD-10-CM | POA: Diagnosis not present

## 2020-05-22 DIAGNOSIS — I5033 Acute on chronic diastolic (congestive) heart failure: Secondary | ICD-10-CM

## 2020-05-22 MED ORDER — FUROSEMIDE 40 MG PO TABS: 40.0000 mg | ORAL_TABLET | Freq: Every day | ORAL | 3 refills | Status: DC

## 2020-05-22 NOTE — Progress Notes (Signed)
Chief Complaint  Patient presents with  . Follow-up    Hand swelling, weight gain     History of Present Illness: 65 yo female with history of CAD, NICM, COPD, HLD, tobacco abuse here today for cardiac follow up. I saw her as a new patient in April 2016. She was hospitalized in [place] December 2015 with COPD exacerbation. Troponin was mildly elevated at 2. Cardiac catheterization in 2015 showed no obstructive CAD. She was felt to have a COPD exacerbation and she improved with treatment of her COPD. She quit smoking in September 2015. Admitted to [facility] March 2017 with respiratory distress, CHF. Cardiac cath March 2017 with minimal CAD, severe LV systolic dysfunction (ZOXW=96-04%). 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  She was diuresed with clinical improvement. She was seen several times in our office after discharge with dizziness and hypotension. Lisinopril and Lasix were decreased. She is followed in the pulmonary office by Dr. Elsworth Soho. Event monitor April 2017 with sinus, PACs, PVCs. Echo May 2018 with normal LV systolic function, mild AI. Most recent echo April 2021 with normal LV systolic function and mild AI. She was admitted to [facility] April 4-7, 2021 with a COPD exacerbation.   She is here today for follow up. The patient denies any chest pain, palpitations, lower extremity edema, orthopnea, PND, dizziness, near syncope or syncope. She has noticed a 5 lb weight gain over the past 5 days. She has started taking Lasix every day and this drops her weight down 3 lbs but the next morning her weight is back up. Abdominal and hand swelling but no LE edema. Her heart rate is always in the low 100s. She has minimally worsened dyspnea.   Primary Care Physician: [name], NP  Past Medical History:  Diagnosis Date  . C8 RADICULOPATHY 06/05/2009  . COPD 08/31/2007  . DEPRESSION 11/19/2006  . DYSLIPIDEMIA  03/12/2009  . MI (mitral incompetence)   . 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 OSTEOARTHRITIS 06/05/2009  . PARESTHESIA 08/24/2007  . TOBACCO ABUSE 01/25/2010    Past Surgical History:  Procedure Laterality Date  . ABDOMINAL HYSTERECTOMY    . APPENDECTOMY  1969  . CARDIAC CATHETERIZATION N/A 07/05/2015   Procedure: Left Heart Cath and Coronary Angiography;  Surgeon: Leonie Man, MD;  Location: [facility] [facility];  Service: Cardiovascular;  Laterality: N/A;  . CHOLECYSTECTOMY  1989  . collapse lung  1990  . COLONOSCOPY WITH PROPOFOL N/A 05/14/2018   Procedure: COLONOSCOPY WITH PROPOFOL;  Surgeon: Irene Shipper, MD;  Location: [facility];  Service: Endoscopy;  Laterality: N/A;  . EXCISION MASS ABDOMINAL  2019  . EXPLORATORY LAPAROTOMY  1969  . POLYPECTOMY  05/14/2018   Procedure: POLYPECTOMY;  Surgeon: Irene Shipper, MD;  Location: [facility];  Service: Endoscopy;;  . SALPINGOOPHORECTOMY  2019    Current Outpatient Medications  Medication Sig Dispense Refill  . albuterol (VENTOLIN HFA) 108 (90 Base) MCG/ACT inhaler Take two puffs every 4-6 hours as needed for shortness of breath/wheezing . 90 day supply (Patient taking differently: Inhale 2 puffs into the lungs See admin instructions. Take two puffs every 4-6 hours as needed for shortness of breath/wheezing . 90 day supply) 162 g 1  . ALPRAZolam (XANAX) 0.5 MG tablet TAKE 1/2 TO 1 TABLET BY MOUTH THREE TIMES DAILY 60 tablet 2  . Ascorbic Acid (VITAMIN C PO) Take 1 tablet by mouth daily.    Marland Kitchen aspirin EC 81  MG EC tablet Take 1 tablet (81 mg total) by mouth daily.    . Budeson-Glycopyrrol-Formoterol (BREZTRI AEROSPHERE) 160-9-4.8 MCG/ACT AERO Inhale 2 puffs into the lungs 2 (two) times daily. 5.9 g 0  . budesonide-formoterol (SYMBICORT) 160-4.5 MCG/ACT inhaler Inhale 2 puffs into the lungs 2 (two) times daily. 30.6 g 6  . Calcium Carbonate-Vit D-Min (CALCIUM 1200 PO) Take 1 tablet by mouth daily.    . Cholecalciferol (VITAMIN D3) 50 MCG (2000  UT) TABS Take 2,000 Units by mouth daily.    . cyanocobalamin 2000 MCG tablet Take 2,000 mcg by mouth daily. Vitamin B12    . furosemide (LASIX) 40 MG tablet Take 1 tablet (40 mg total) by mouth daily. 90 tablet 3  . Ginkgo Biloba Extract 60 MG CAPS Take 60 mg by mouth daily.    Marland Kitchen HYDROcodone-acetaminophen (NORCO) 7.5-325 MG tablet Take 1 tablet by mouth 2 (two) times daily as needed (pain/cough). 45 tablet 0  . ipratropium-albuterol (DUONEB) 0.5-2.5 (3) MG/3ML SOLN USE 3 ML VIA NEBULIZER EVERY 6 HOURS AS NEEDED.  DX: J44.9 360 mL 5  . losartan (COZAAR) 25 MG tablet TAKE 1 TABLET EVERY DAY 90 tablet 1  . magnesium oxide (MAG-OX) 400 MG tablet Take 400 mg by mouth daily.    . nitroGLYCERIN (NITROSTAT) 0.4 MG SL tablet Place 1 tablet (0.4 mg total) under the tongue every 5 (five) minutes as needed for chest pain. 25 tablet 3  . OXYGEN Inhale 2 L into the lungs continuous.    Marland Kitchen PARoxetine (PAXIL) 30 MG tablet Take 1 tablet (30 mg total) by mouth daily. 90 tablet 1  . predniSONE (DELTASONE) 10 MG tablet Take 4 tablets (40 mg total) by mouth daily with breakfast. Please take 4 pills daily for 3 days then 3 pills daily for 3 days then 2 pills daily for 3 days then 1 pill daily for 3 days then continue taking 5 mg daily 30 tablet 0  . Spacer/Aero Chamber Mouthpiece MISC 1 Device by Does not apply route as directed. 1 each 0  . Tiotropium Bromide Monohydrate (SPIRIVA RESPIMAT) 2.5 MCG/ACT AERS USE 2 INHALATIONS BY MOUTH  DAILY (Patient taking differently: Inhale 2 puffs into the lungs daily at 12 noon.) 12 g 3   No current facility-administered medications for this visit.    Allergies  Allergen Reactions  . Ciprofloxacin Other (See Comments)    Hallucinations   . Lisinopril Cough  . Penicillins Hives    Tolerates Omnicef Has patient had a PCN reaction causing immediate rash, facial/tongue/throat swelling, SOB or lightheadedness with hypotension: Yes Has patient had a PCN reaction causing severe  rash involving mucus membranes or skin necrosis: No Has patient had a PCN reaction that required hospitalization No Has patient had a PCN reaction occurring within the last 10 years: No If all of the above answers are "NO", then may proceed with Cephalosporin use.     Social History   Socioeconomic History  . Marital status: Married    Spouse name: [name]  . Number of children: 2  . Years of education: Not on file  . Highest education level: Not on file  Occupational History  . Occupation: Patient care aide-sits with elderly lady  Tobacco Use  . Smoking status: Former Smoker    Packs/day: 2.00    Years: 38.00    Pack years: 76.00    Types: Cigarettes    Quit date: 03/07/2014    Years since quitting: 6.2  . Smokeless tobacco: Never Used  Vaping Use  . Vaping Use: Never used  Substance and Sexual Activity  . Alcohol use: No    Alcohol/week: 0.0 standard drinks  . Drug use: No  . Sexual activity: Not on file  Other Topics Concern  . Not on file  Social History Narrative   [facility] (01/12/17):   Originally from [place]. Has lived in [place] for 17 years. Married. Has 2 dogs currently. No mold and only 1 indoor plant. Had her home professionally cleaned a month ago. No bird or hot tub exposure. 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    Social Determinants of Health   Financial Resource Strain: Low Risk   . Difficulty of Paying Living Expenses: Not hard at all  Food Insecurity: No Food Insecurity  . Worried About Charity fundraiser in the Last Year: Never true  . Ran Out of Food in the Last Year: Never true  Transportation Needs: No Transportation Needs  . Lack of Transportation (Medical): No  . Lack of Transportation (Non-Medical): No  Physical Activity: Inactive  . Days of Exercise per Week: 0 days  . Minutes of Exercise per Session: 0 min  Stress: No Stress Concern Present  . Feeling of Stress : Not at all  Social Connections:  Moderately Integrated  . Frequency of Communication with Friends and Family: More than three times a week  . Frequency of Social Gatherings with Friends and Family: More than three times a week  . Attends Religious Services: More than 4 times per year  . Active Member of Clubs or Organizations: No  . Attends Archivist Meetings: Never  . Marital Status: Married  Human resources officer Violence: Not At Risk  . Fear of Current or Ex-Partner: No  . Emotionally Abused: No  . Physically Abused: No  . Sexually Abused: No    Family History  Problem Relation Age of Onset  . Cancer Mother        lung  . Heart attack Mother   . Hypertension Sister   . Multiple sclerosis Brother   . Diabetes Sister   . Rheum arthritis Sister   . Thyroid disease Sister   . Multiple sclerosis Other   . Alcohol abuse Other   . Arthritis Other   . Diabetes Other   . Kidney disease Other   . Cancer Other        lung,ovarian,skin, uterine  . Stroke Other   . Heart disease Other   . Melanoma Other   . Heart attack Maternal Uncle   . Heart attack Other        NEICE  . Heart attack Other        NEPHEW  . Hypertension Brother   . Stroke Maternal Grandmother   . Colon cancer Neg Hx     Review of Systems:  As stated in the HPI and otherwise negative.   BP 130/62   Pulse 91   Ht 5\' 3"  (1.6 m)   Wt 132 lb (59.9 kg)   SpO2 97%   BMI 23.38 kg/m   Physical Examination:  General: Well developed, well nourished, NAD  HEENT: OP clear, mucus membranes moist  SKIN: warm, dry. No rashes. Neuro: No focal deficits  Musculoskeletal: Muscle strength 5/5 all ext  Psychiatric: Mood and affect normal  Neck: No JVD, no carotid bruits, no thyromegaly, no lymphadenopathy.  Lungs:Clear bilaterally, no wheezes, rhonci, crackles Cardiovascular: Regular rate and rhythm. No murmurs, gallops or rubs.  Abdomen:Soft. Bowel sounds present. Non-tender.  Extremities: No lower extremity edema. Pulses are 2 + in the  bilateral DP/PT.  Echo April 2021: 1. Left ventricular ejection fraction, by estimation, is 60 to 65%. The  left ventricle has normal function. The left ventricle has no regional  wall motion abnormalities. Left ventricular diastolic parameters are  consistent with Grade I diastolic  dysfunction (impaired relaxation).  2. Right ventricular systolic function is normal. The right ventricular  size is normal. Tricuspid regurgitation signal is inadequate for assessing  PA pressure.  3. The mitral valve is grossly normal. No evidence of mitral valve  regurgitation. No evidence of mitral stenosis.  4. The aortic valve is tricuspid. Aortic valve regurgitation is mild.  Mild aortic valve sclerosis is present, with no evidence of aortic valve  stenosis.  5. The inferior vena cava is normal in size with greater than 50%  respiratory variability, suggesting right atrial pressure of 3 mmHg.   EKG:  EKG is ordered today. The ekg ordered today demonstrates sinus  Recent Labs: 09/17/2019: B Natriuretic Peptide 52.5 03/30/2020: ALT 28; Magnesium 2.5 04/03/2020: Hemoglobin 12.9; Platelets 283 05/22/2020: BUN 16; Creatinine, Ser 0.87; NT-Pro BNP 147; Potassium 4.0; Sodium 146   Lipid Panel    Component Value Date/Time   CHOL 131 12/17/2018 0906   CHOL 221 (H) 01/25/2018 0811   TRIG 104.0 12/17/2018 0906   HDL 44.40 12/17/2018 0906   HDL 41 01/25/2018 0811   CHOLHDL 3 12/17/2018 0906   VLDL 20.8 12/17/2018 0906   LDLCALC 66 12/17/2018 0906   LDLCALC 151 (H) 01/25/2018 0811   LDLDIRECT 155.4 12/29/2012 1209     Wt Readings from Last 3 Encounters:  05/22/20 132 lb (59.9 kg)  04/18/20 127 lb (57.6 kg)  04/12/20 127 lb (57.6 kg)     Other studies Reviewed: Additional studies/ records that were reviewed today include: Records from [facility] Review of the above records demonstrates:   Assessment and Plan:   1. CAD without angina: She has no chest pain. She has mild CAD by  cath in 2017. Will continue ASA and statin.   2. Hyperlipidemia: LDL at goal in 2020. Continue statin  3.  Acute on Chronic diastolic CHF/Non-ischemic cardiomyopathy: LV function normal by echo April 2021. Now with mild volume overload. Will continue Losartan. Will have her take Lasix 40 mg po BID for three days then 40 mg per day. I suspect that her recent volume retention is related to tachycardia from the change in her COPD therapy and recent steroid use. Check BMET and BNP today. She is off of the beta blocker due to her COPD. BMET one week  4. COPD: closely followed by pulmonary.  Current medicines are reviewed at length with the patient today.  The patient does not have concerns regarding medicines.  The following changes have been made:  no change  Labs/ tests ordered today include:   Orders Placed This Encounter  Procedures  . Basic metabolic panel  . Pro b natriuretic peptide (BNP)  . Basic metabolic panel  . EKG 12-Lead    Disposition:   F/U with me in 1  months  Signed, Lauree Chandler, MD 05/23/2020 8:25 AM    [facility] [address], [place], [place]  [address] Phone: [phone]; Fax: [phone]

## 2020-05-22 NOTE — Patient Instructions (Addendum)
Medication Instructions:  Your physician has recommended you make the following change in your medication:   INCREASE Lasix (furosemide) 40mg  twice a day for 3 days, and then start taking lasix 40mg  daily.   *If you need a refill on your cardiac medications before your next appointment, please call your pharmacy*   Lab Work: TODAY: BMET,BNP In 10 days - BMET  If you have labs (blood work) drawn today and your tests are completely normal, you will receive your results only by: Marland Kitchen MyChart Message (if you have MyChart) OR . A paper copy in the mail If you have any lab test that is abnormal or we need to change your treatment, we will call you to review the results.   Testing/Procedures: none   Follow-Up: At [facility], you and your health needs are our priority.  As part of our continuing mission to provide you with exceptional heart care, we have created designated Provider Care Teams.  These Care Teams include your primary Cardiologist (physician) and Advanced Practice Providers (APPs -  Physician Assistants and Nurse Practitioners) who all work together to provide you with the care you need, when you need it.  Your next appointment:  MARCH 21st at 10:40am 4-6 week(s)  The format for your next appointment:   In Person  Provider:   You may see Lauree Chandler, MD or one of the following Advanced Practice Providers on your designated Care Team:    Melina Copa, PA-C  Ermalinda Barrios, PA-C

## 2020-05-23 LAB — BASIC METABOLIC PANEL
BUN/Creatinine Ratio: 18 (ref 12–28)
BUN: 16 mg/dL (ref 8–27)
CO2: 28 mmol/L (ref 20–29)
Calcium: 9.8 mg/dL (ref 8.7–10.3)
Chloride: 103 mmol/L (ref 96–106)
Creatinine, Ser: 0.87 mg/dL (ref 0.57–1.00)
GFR calc Af Amer: 80 mL/min/{1.73_m2} (ref >59)
GFR calc non Af Amer: 70 mL/min/{1.73_m2} (ref >59)
Glucose: 80 mg/dL (ref 65–99)
Potassium: 4 mmol/L (ref 3.5–5.2)
Sodium: 146 mmol/L — ABNORMAL HIGH (ref 134–144)

## 2020-05-23 LAB — PRO B NATRIURETIC PEPTIDE: NT-Pro BNP: 147 pg/mL (ref 0–301)

## 2020-05-31 ENCOUNTER — Other Ambulatory Visit: Payer: Medicare Other

## 2020-05-31 ENCOUNTER — Other Ambulatory Visit: Payer: Self-pay

## 2020-05-31 DIAGNOSIS — I5033 Acute on chronic diastolic (congestive) heart failure: Secondary | ICD-10-CM

## 2020-05-31 DIAGNOSIS — I2510 Atherosclerotic heart disease of native coronary artery without angina pectoris: Secondary | ICD-10-CM

## 2020-05-31 DIAGNOSIS — E785 Hyperlipidemia, unspecified: Secondary | ICD-10-CM

## 2020-05-31 LAB — BASIC METABOLIC PANEL
BUN/Creatinine Ratio: 30 — ABNORMAL HIGH (ref 12–28)
BUN: 25 mg/dL (ref 8–27)
CO2: 23 mmol/L (ref 20–29)
Calcium: 9.5 mg/dL (ref 8.7–10.3)
Chloride: 101 mmol/L (ref 96–106)
Creatinine, Ser: 0.83 mg/dL (ref 0.57–1.00)
GFR calc Af Amer: 85 mL/min/{1.73_m2} (ref >59)
GFR calc non Af Amer: 74 mL/min/{1.73_m2} (ref >59)
Glucose: 84 mg/dL (ref 65–99)
Potassium: 4.3 mmol/L (ref 3.5–5.2)
Sodium: 137 mmol/L (ref 134–144)

## 2020-06-01 ENCOUNTER — Ambulatory Visit (INDEPENDENT_AMBULATORY_CARE_PROVIDER_SITE_OTHER): Payer: Medicare Other | Admitting: Pulmonary Disease

## 2020-06-01 ENCOUNTER — Encounter: Payer: Self-pay | Admitting: Pulmonary Disease

## 2020-06-01 ENCOUNTER — Telehealth: Payer: Self-pay | Admitting: Primary Care

## 2020-06-01 DIAGNOSIS — J449 Chronic obstructive pulmonary disease, unspecified: Secondary | ICD-10-CM

## 2020-06-01 DIAGNOSIS — I2510 Atherosclerotic heart disease of native coronary artery without angina pectoris: Secondary | ICD-10-CM | POA: Diagnosis not present

## 2020-06-01 DIAGNOSIS — J9611 Chronic respiratory failure with hypoxia: Secondary | ICD-10-CM | POA: Diagnosis not present

## 2020-06-01 DIAGNOSIS — M8008XD Age-related osteoporosis with current pathological fracture, vertebra(e), subsequent encounter for fracture with routine healing: Secondary | ICD-10-CM | POA: Diagnosis not present

## 2020-06-01 MED ORDER — PREDNISONE 5 MG PO TABS: ORAL_TABLET | ORAL | 0 refills | Status: DC

## 2020-06-01 NOTE — Patient Instructions (Signed)
  Retry Breztri and okay to fill prescription of this works for you. This will take the place of Symbicort and Spiriva.  Decrease prednisone to 5 mg Monday/Thursday starting March 15 Okay to stop taking prednisone April 1

## 2020-06-01 NOTE — Addendum Note (Signed)
Addended by: Len Blalock on: 06/01/2020 11:43 AM   Modules accepted: Orders

## 2020-06-01 NOTE — Assessment & Plan Note (Signed)
Continue 2 L of oxygen during exertion. She is hesitant to restart pulmonary rehab program due to fear of Covid but she would be a good candidate for home rehab
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
## 2020-06-01 NOTE — Assessment & Plan Note (Signed)
Will taper off steroids.  She was given Fosamax by PCP but she has not started this yet and is hesitant. I emphasized that she will need medication for osteoporosis, she has an appointment lined up with endocrinology next week

## 2020-06-01 NOTE — Assessment & Plan Note (Signed)
Retry Breztri and okay to fill prescription of this works for you. This will take the place of Symbicort and Spiriva-hopefully decrease her co-pays and be as effective We can also consider adding Roflumilast but she is very hesitant to start new medications  Concerned about side effects of steroids and will attempt to taper to off Decrease prednisone to 5 mg Monday/Thursday starting March 15 Okay to stop taking prednisone April 1

## 2020-06-01 NOTE — Telephone Encounter (Signed)
Patient was previously given Breztri samples to try instead of her Symbicort/Spiriva regimen.  Pt used 1-2wk sample of Breztri and has decided to continue taking Symbicort and Spiriva.  Pt is here for an OV with RA today, and brought unopened Breztri samples back to our office.  Samples have been given to [facility] to be logged back in to our sample closet.  Nothing further needed at this time- will close encounter.

## 2020-06-02 ENCOUNTER — Other Ambulatory Visit: Payer: Self-pay | Admitting: Adult Health

## 2020-06-02 DIAGNOSIS — F419 Anxiety disorder, unspecified: Secondary | ICD-10-CM

## 2020-06-02 DIAGNOSIS — F32A Depression, unspecified: Secondary | ICD-10-CM

## 2020-06-04 ENCOUNTER — Encounter: Payer: Self-pay | Admitting: Endocrinology

## 2020-06-04 ENCOUNTER — Other Ambulatory Visit: Payer: Self-pay

## 2020-06-04 ENCOUNTER — Ambulatory Visit (INDEPENDENT_AMBULATORY_CARE_PROVIDER_SITE_OTHER): Payer: Medicare Other | Admitting: Endocrinology

## 2020-06-04 DIAGNOSIS — I2510 Atherosclerotic heart disease of native coronary artery without angina pectoris: Secondary | ICD-10-CM | POA: Diagnosis not present

## 2020-06-04 DIAGNOSIS — M8000XD Age-related osteoporosis with current pathological fracture, unspecified site, subsequent encounter for fracture with routine healing: Secondary | ICD-10-CM

## 2020-06-04 NOTE — Progress Notes (Signed)
Subjective:    Patient ID: Kim [name], female    DOB: Jul 16, 1955, 65 y.o.   MRN: [id number]  HPI Pt is referred by [name], NP, for osteoporosis.  Pt was noted to have osteoporosis in 2021.  She has never been on medication for this.  Only bony fractures have been several spinal fractures.  She has no history of any of the following: early menopause, multiple myeloma, renal dz, thyroid problems, alcoholism, liver dz, Vit-D deficiency, and hyperparathyroidism.  She does not take heparin or anticonvulsants.  She has been off and on steroid rx since 2012.  She was rx'ed fosamax 1 month ago, but she has not yet started taking.  She quit smoking 2015.  She falls approx once per week.  She has no h/o trauma to the back.   Past Medical History:  Diagnosis Date  . C8 RADICULOPATHY 06/05/2009  . COPD 08/31/2007  . DEPRESSION 11/19/2006  . DYSLIPIDEMIA 03/12/2009  . MI (mitral incompetence)   . 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 OSTEOARTHRITIS 06/05/2009  . PARESTHESIA 08/24/2007  . TOBACCO ABUSE 01/25/2010    Past Surgical History:  Procedure Laterality Date  . ABDOMINAL HYSTERECTOMY    . APPENDECTOMY  1969  . CARDIAC CATHETERIZATION N/A 07/05/2015   Procedure: Left Heart Cath and Coronary Angiography;  Surgeon: Leonie Man, MD;  Location: [facility];  Service: Cardiovascular;  Laterality: N/A;  . CHOLECYSTECTOMY  1989  . collapse lung  1990  . COLONOSCOPY WITH PROPOFOL N/A 05/14/2018   Procedure: COLONOSCOPY WITH PROPOFOL;  Surgeon: Irene Shipper, MD;  Location: [facility];  Service: Endoscopy;  Laterality: N/A;  . EXCISION MASS ABDOMINAL  2019  . EXPLORATORY LAPAROTOMY  1969  . POLYPECTOMY  05/14/2018   Procedure: POLYPECTOMY;  Surgeon: Irene Shipper, MD;  Location: [facility];  Service: Endoscopy;;  . SALPINGOOPHORECTOMY  2019    Social History   Socioeconomic History  . Marital status: Married    Spouse name: [name]  . Number of children: 2  . Years of education: Not  on file  . Highest education level: Not on file  Occupational History  . Occupation: Patient care aide-sits with elderly lady  Tobacco Use  . Smoking status: Former Smoker    Packs/day: 2.00    Years: 38.00    Pack years: 76.00    Types: Cigarettes    Quit date: 03/07/2014    Years since quitting: 6.2  . Smokeless tobacco: Never Used  Vaping Use  . Vaping Use: Never used  Substance and Sexual Activity  . Alcohol use: No    Alcohol/week: 0.0 standard drinks  . Drug use: No  . Sexual activity: Not on file  Other Topics Concern  . Not on file  Social History Narrative   [facility] (01/12/17):   Originally from [facility]. Has lived in [place] for 17 years. Married. Has 2 dogs currently. No mold and only 1 indoor plant. Had her home professionally cleaned a month ago. No bird or hot tub exposure. 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    Social Determinants of Health   Financial Resource Strain: Low Risk   . Difficulty of Paying Living Expenses: Not hard at all  Food Insecurity: No Food Insecurity  . Worried About Charity fundraiser in the Last Year: Never true  . Ran Out of Food in the Last Year: Never true  Transportation Needs: No Transportation Needs  . Lack of  Transportation (Medical): No  . Lack of Transportation (Non-Medical): No  Physical Activity: Inactive  . Days of Exercise per Week: 0 days  . Minutes of Exercise per Session: 0 min  Stress: No Stress Concern Present  . Feeling of Stress : Not at all  Social Connections: Moderately Integrated  . Frequency of Communication with Friends and Family: More than three times a week  . Frequency of Social Gatherings with Friends and Family: More than three times a week  . Attends Religious Services: More than 4 times per year  . Active Member of Clubs or Organizations: No  . Attends Banker Meetings: Never  . Marital Status: Married  Catering manager Violence: Not At Risk  . Fear of  Current or Ex-Partner: No  . Emotionally Abused: No  . Physically Abused: No  . Sexually Abused: No    Current Outpatient Medications on File Prior to Visit  Medication Sig Dispense Refill  . albuterol (VENTOLIN HFA) 108 (90 Base) MCG/ACT inhaler Take two puffs every 4-6 hours as needed for shortness of breath/wheezing . 90 day supply (Patient taking differently: Inhale 2 puffs into the lungs See admin instructions. Take two puffs every 4-6 hours as needed for shortness of breath/wheezing . 90 day supply) 162 g 1  . ALPRAZolam (XANAX) 0.5 MG tablet TAKE 1/2 TO 1 TABLET BY MOUTH THREE TIMES DAILY 60 tablet 2  . Ascorbic Acid (VITAMIN C PO) Take 1 tablet by mouth daily.    Marland Kitchen aspirin EC 81 MG EC tablet Take 1 tablet (81 mg total) by mouth daily.    . budesonide-formoterol (SYMBICORT) 160-4.5 MCG/ACT inhaler Inhale 2 puffs into the lungs 2 (two) times daily. 30.6 g 6  . Calcium Carbonate-Vit D-Min (CALCIUM 1200 PO) Take 1 tablet by mouth daily.    . Cholecalciferol (VITAMIN D3) 50 MCG (2000 UT) TABS Take 2,000 Units by mouth daily.    . cyanocobalamin 2000 MCG tablet Take 2,000 mcg by mouth daily. Vitamin B12    . furosemide (LASIX) 40 MG tablet Take 1 tablet (40 mg total) by mouth daily. 90 tablet 3  . Ginkgo Biloba Extract 60 MG CAPS Take 60 mg by mouth daily.    Marland Kitchen HYDROcodone-acetaminophen (NORCO) 7.5-325 MG tablet Take 1 tablet by mouth 2 (two) times daily as needed (pain/cough). 45 tablet 0  . ipratropium-albuterol (DUONEB) 0.5-2.5 (3) MG/3ML SOLN USE 3 ML VIA NEBULIZER EVERY 6 HOURS AS NEEDED.  DX: J44.9 360 mL 5  . losartan (COZAAR) 25 MG tablet TAKE 1 TABLET EVERY DAY 90 tablet 1  . magnesium oxide (MAG-OX) 400 MG tablet Take 400 mg by mouth daily.    . nitroGLYCERIN (NITROSTAT) 0.4 MG SL tablet Place 1 tablet (0.4 mg total) under the tongue every 5 (five) minutes as needed for chest pain. 25 tablet 3  . OXYGEN Inhale 2 L into the lungs continuous.    Marland Kitchen PARoxetine (PAXIL) 30 MG tablet  Take 1 tablet (30 mg total) by mouth daily. 90 tablet 1  . predniSONE (DELTASONE) 5 MG tablet 1 tab every other day until 3/15, then 1 tab 2X weekly until 4/1 30 tablet 0  . Spacer/Aero Chamber Mouthpiece MISC 1 Device by Does not apply route as directed. 1 each 0  . Tiotropium Bromide Monohydrate (SPIRIVA RESPIMAT) 2.5 MCG/ACT AERS USE 2 INHALATIONS BY MOUTH  DAILY (Patient taking differently: Inhale 2 puffs into the lungs daily at 12 noon.) 12 g 3   No current facility-administered medications on file  prior to visit.    Allergies  Allergen Reactions  . Ciprofloxacin Other (See Comments)    Hallucinations   . Lisinopril Cough  . Penicillins Hives    Tolerates Omnicef Has patient had a PCN reaction causing immediate rash, facial/tongue/throat swelling, SOB or lightheadedness with hypotension: Yes Has patient had a PCN reaction causing severe rash involving mucus membranes or skin necrosis: No Has patient had a PCN reaction that required hospitalization No Has patient had a PCN reaction occurring within the last 10 years: No If all of the above answers are "NO", then may proceed with Cephalosporin use.     Family History  Problem Relation Age of Onset  . Cancer Mother        lung  . Heart attack Mother   . Hypertension Sister   . Multiple sclerosis Brother   . Diabetes Sister   . Rheum arthritis Sister   . Thyroid disease Sister   . Multiple sclerosis Other   . Alcohol abuse Other   . Arthritis Other   . Diabetes Other   . Kidney disease Other   . Cancer Other        lung,ovarian,skin, uterine  . Stroke Other   . Heart disease Other   . Melanoma Other   . Osteoporosis Other   . Heart attack Maternal Uncle   . Heart attack Other        NEICE  . Heart attack Other        NEPHEW  . Hypertension Brother   . Stroke Maternal Grandmother   . Colon cancer Neg Hx     BP 130/60 (BP Location: Right Arm, Patient Position: Sitting, Cuff Size: Normal)   Pulse 94   Ht 5' 2.5"  (1.588 m)   Wt 134 lb (60.8 kg)   SpO2 95%   BMI 24.12 kg/m     Review of Systems denies weight loss, heartburn, cold intolerance, muscle cramps, memory loss, and back pain.      Objective:   Physical Exam VITAL SIGNS:  See vs page GENERAL: no distress.  Has 02 on.   NECK: There is no palpable thyroid enlargement.  No thyroid nodule is palpable.  No palpable lymphadenopathy at the anterior neck.  SPINE: no kyphosis GAIT: normal and steady  Lab Results  Component Value Date   CALCIUM 9.5 05/31/2020   CAION 1.20 09/17/2019   PHOS 2.0 (L) 07/06/2015   Lab Results  Component Value Date   CREATININE 0.83 05/31/2020   BUN 25 05/31/2020   NA 137 05/31/2020   K 4.3 05/31/2020   CL 101 05/31/2020   CO2 23 05/31/2020     CT: Chronic moderate compression fracture deformity of T7, unchanged. New mild compression fracture deformity of L1 since October of 2021.   I have reviewed outside records, and summarized: Pt had CT for lung cancer screening, and spinal compression fractures were incidentally noted.     Assessment & Plan:  Osteoporosis, new to me.  Uncontrolled.    Patient Instructions   Take calcium 1200 mg per day, and vitamin-D, 400 units per day Please consider the treatment options we discussed, and let us know.       Fall Prevention in the Home, Adult Falls can cause injuries and can affect people from all age groups. There are many simple things that you can do to make your home safe and to help prevent falls. Ask for help when making these changes, if needed. What actions can I  take to prevent falls? General instructions  Use good lighting in all rooms. Replace any light bulbs that burn out.  Turn on lights if it is dark. Use night-lights.  Place frequently used items in easy-to-reach places. Lower the shelves around your home if necessary.  Set up furniture so that there are clear paths around it. Avoid moving your furniture around.  Remove throw  rugs and other tripping hazards from the floor.  Avoid walking on wet floors.  Fix any uneven floor surfaces.  Add color or contrast paint or tape to grab bars and handrails in your home. Place contrasting color strips on the first and last steps of stairways.  When you use a stepladder, make sure that it is completely opened and that the sides are firmly locked. Have someone hold the ladder while you are using it. Do not climb a closed stepladder.  Be aware of any and all pets. What can I do in the bathroom?  Keep the floor dry. Immediately clean up any water that spills onto the floor.  Remove soap buildup in the tub or shower on a regular basis.  Use non-skid mats or decals on the floor of the tub or shower.  Attach bath mats securely with double-sided, non-slip rug tape.  If you need to sit down while you are in the shower, use a plastic, non-slip stool.  Install grab bars by the toilet and in the tub and shower. Do not use towel bars as grab bars.      What can I do in the bedroom?  Make sure that a bedside light is easy to reach.  Do not use oversized bedding that drapes onto the floor.  Have a firm chair that has side arms to use for getting dressed. What can I do in the kitchen?  Clean up any spills right away.  If you need to reach for something above you, use a sturdy step stool that has a grab bar.  Keep electrical cables out of the way.  Do not use floor polish or wax that makes floors slippery. If you must use wax, make sure that it is non-skid floor wax. What can I do in the stairways?  Do not leave any items on the stairs.  Make sure that you have a light switch at the top of the stairs and the bottom of the stairs. Have them installed if you do not have them.  Make sure that there are handrails on both sides of the stairs. Fix handrails that are broken or loose. Make sure that handrails are as long as the stairways.  Install non-slip stair treads on  all stairs in your home.  Avoid having throw rugs at the top or bottom of stairways, or secure the rugs with carpet tape to prevent them from moving.  Choose a carpet design that does not hide the edge of steps on the stairway.  Check any carpeting to make sure that it is firmly attached to the stairs. Fix any carpet that is loose or worn. What can I do on the outside of my home?  Use bright outdoor lighting.  Regularly repair the edges of walkways and driveways and fix any cracks.  Remove high doorway thresholds.  Trim any shrubbery on the main path into your home.  Regularly check that handrails are securely fastened and in good repair. Both sides of any steps should have handrails.  Install guardrails along the edges of any raised decks or porches.  Clear walkways of debris and clutter, including tools and rocks.  Have leaves, snow, and ice cleared regularly.  Use sand or salt on walkways during winter months.  In the garage, clean up any spills right away, including grease or oil spills. What other actions can I take?  Wear closed-toe shoes that fit well and support your feet. Wear shoes that have rubber soles or low heels.  Use mobility aids as needed, such as canes, walkers, scooters, and crutches.  Review your medicines with your health care provider. Some medicines can cause dizziness or changes in blood pressure, which increase your risk of falling. Talk with your health care provider about other ways that you can decrease your risk of falls. This may include working with a physical therapist or trainer to improve your strength, balance, and endurance. Where to find more information  Centers for Disease Control and Prevention, STEADI: WebmailGuide.co.za  [employer] on Aging: BrainJudge.co.uk Contact a health care provider if:  You are afraid of falling at home.  You feel weak, drowsy, or dizzy at home.  You fall at home. Summary  There  are many simple things that you can do to make your home safe and to help prevent falls.  Ways to make your home safe include removing tripping hazards and installing grab bars in the bathroom.  Ask for help when making these changes in your home. This information is not intended to replace advice given to you by your health care provider. Make sure you discuss any questions you have with your health care provider. Document Revised: 03/06/2017 Document Reviewed: 11/06/2016 Elsevier Patient Education  2021 [employer].   Zoledronic Acid Injection (Osteoporosis) What is this medicine? ZOLEDRONIC ACID (ZOE le dron ik AS id) slows calcium loss from bones. It treats osteoporosis. It may be used in other people at risk for bone loss. This medicine may be used for other purposes; ask your health care provider or pharmacist if you have questions. COMMON BRAND NAME(S): Reclast, Zometa What should I tell my health care provider before I take this medicine? They need to know if you have any of these conditions:  bleeding disorder  cancer  dental disease  kidney disease  low levels of calcium in the blood  low red blood cell counts  lung or breathing disease (asthma)  receiving steroids like dexamethasone or prednisone  an unusual or allergic reaction to zoledronic acid, other medicines, foods, dyes, or preservatives  pregnant or trying to get pregnant  breast-feeding How should I use this medicine? This drug is injected into a vein. It is given by a health care provider in a hospital or clinic setting. A special MedGuide will be given to you before each treatment. Be sure to read this information carefully each time. Talk to your health care provider about the use of this drug in children. Special care may be needed. Overdosage: If you think you have taken too much of this medicine contact a poison control center or emergency room at once. NOTE: This medicine is only for you. Do  not share this medicine with others. What if I miss a dose? Keep appointments for follow-up doses. It is important not to miss your dose. Call your health care provider if you are unable to keep an appointment. What may interact with this medicine?  certain antibiotics given by injection  NSAIDs, medicines for pain and inflammation, like ibuprofen or naproxen  some diuretics like bumetanide, furosemide  teriparatide This list may not  describe all possible interactions. Give your health care provider a list of all the medicines, herbs, non-prescription drugs, or dietary supplements you use. Also tell them if you smoke, drink alcohol, or use illegal drugs. Some items may interact with your medicine. What should I watch for while using this medicine? Visit your health care provider for regular checks on your progress. It may be some time before you see the benefit from this drug. Some people who take this drug have severe bone, joint, or muscle pain. This drug may also increase your risk for jaw problems or a broken thigh bone. Tell your health care provider right away if you have severe pain in your jaw, bones, joints, or muscles. Tell you health care provider if you have any pain that does not go away or that gets worse. You should make sure you get enough calcium and vitamin D while you are taking this drug. Discuss the foods you eat and the vitamins you take with your health care provider. You may need blood work done while you are taking this drug. Tell your dentist and dental surgeon that you are taking this drug. You should not have major dental surgery while on this drug. See your dentist to have a dental exam and fix any dental problems before starting this drug. Take good care of your teeth while on this drug. Make sure you see your dentist for regular follow-up appointments. What side effects may I notice from receiving this medicine? Side effects that you should report to your doctor or  health care provider as soon as possible:  allergic reactions (skin rash, itching or hives; swelling of the face, lips, or tongue)  bone pain  infection (fever, chills, cough, sore throat, pain or trouble passing urine)  jaw pain, especially after dental work  joint pain  kidney injury (trouble passing urine or change in the amount of urine)  low calcium levels (fast heartbeat; muscle cramps or pain; pain, tingling, or numbness in the hands or feet; seizures)  low red blood cell counts (trouble breathing; feeling faint; lightheaded, falls; unusually weak or tired)  muscle pain  palpitations  redness, blistering, peeling, or loosening of the skin, including inside the mouth Side effects that usually do not require medical attention (report to your doctor or health care provider if they continue or are bothersome):  diarrhea  eye irritation, itching, or pain  fever  general ill feeling or flu-like symptoms  headache  increase in blood pressure  nausea  pain, redness, or irritation at site where injected  stomach pain  upset stomach This list may not describe all possible side effects. Call your doctor for medical advice about side effects. You may report side effects to FDA at 1-800-FDA-1088. Where should I keep my medicine? This drug is given in a hospital or clinic. It will not be stored at home. NOTE: This sheet is a summary. It may not cover all possible information. If you have questions about this medicine, talk to your doctor, pharmacist, or health care provider.  2021 Elsevier/Gold Standard (2019-01-10 11:46:18)   Denosumab injection What is this medicine? DENOSUMAB (den oh sue mab) slows bone breakdown. Prolia is used to treat osteoporosis in women after menopause and in men, and in people who are taking corticosteroids for 6 months or more. Delton See is used to treat a high calcium level due to cancer and to prevent bone fractures and other bone problems  caused by multiple myeloma or cancer bone metastases. Delton See  is also used to treat giant cell tumor of the bone. This medicine may be used for other purposes; ask your health care provider or pharmacist if you have questions. COMMON BRAND NAME(S): Prolia, XGEVA What should I tell my health care provider before I take this medicine? They need to know if you have any of these conditions:  dental disease  having surgery or tooth extraction  infection  kidney disease  low levels of calcium or Vitamin D in the blood  malnutrition  on hemodialysis  skin conditions or sensitivity  thyroid or parathyroid disease  an unusual reaction to denosumab, other medicines, foods, dyes, or preservatives  pregnant or trying to get pregnant  breast-feeding How should I use this medicine? This medicine is for injection under the skin. It is given by a health care professional in a hospital or clinic setting. A special MedGuide will be given to you before each treatment. Be sure to read this information carefully each time. For Prolia, talk to your pediatrician regarding the use of this medicine in children. Special care may be needed. For Delton See, talk to your pediatrician regarding the use of this medicine in children. While this drug may be prescribed for children as young as 13 years for selected conditions, precautions do apply. Overdosage: If you think you have taken too much of this medicine contact a poison control center or emergency room at once. NOTE: This medicine is only for you. Do not share this medicine with others. What if I miss a dose? It is important not to miss your dose. Call your doctor or health care professional if you are unable to keep an appointment. What may interact with this medicine? Do not take this medicine with any of the following medications:  other medicines containing denosumab This medicine may also interact with the following medications:  medicines that  lower your chance of fighting infection  steroid medicines like prednisone or cortisone This list may not describe all possible interactions. Give your health care provider a list of all the medicines, herbs, non-prescription drugs, or dietary supplements you use. Also tell them if you smoke, drink alcohol, or use illegal drugs. Some items may interact with your medicine. What should I watch for while using this medicine? Visit your doctor or health care professional for regular checks on your progress. Your doctor or health care professional may order blood tests and other tests to see how you are doing. Call your doctor or health care professional for advice if you get a fever, chills or sore throat, or other symptoms of a cold or flu. Do not treat yourself. This drug may decrease your body's ability to fight infection. Try to avoid being around people who are sick. You should make sure you get enough calcium and vitamin D while you are taking this medicine, unless your doctor tells you not to. Discuss the foods you eat and the vitamins you take with your health care professional. See your dentist regularly. Brush and floss your teeth as directed. Before you have any dental work done, tell your dentist you are receiving this medicine. Do not become pregnant while taking this medicine or for 5 months after stopping it. Talk with your doctor or health care professional about your birth control options while taking this medicine. Women should inform their doctor if they wish to become pregnant or think they might be pregnant. There is a potential for serious side effects to an unborn child. Talk to your health care  professional or pharmacist for more information. What side effects may I notice from receiving this medicine? Side effects that you should report to your doctor or health care professional as soon as possible:  allergic reactions like skin rash, itching or hives, swelling of the face, lips, or  tongue  bone pain  breathing problems  dizziness  jaw pain, especially after dental work  redness, blistering, peeling of the skin  signs and symptoms of infection like fever or chills; cough; sore throat; pain or trouble passing urine  signs of low calcium like fast heartbeat, muscle cramps or muscle pain; pain, tingling, numbness in the hands or feet; seizures  unusual bleeding or bruising  unusually weak or tired Side effects that usually do not require medical attention (report to your doctor or health care professional if they continue or are bothersome):  constipation  diarrhea  headache  joint pain  loss of appetite  muscle pain  runny nose  tiredness  upset stomach This list may not describe all possible side effects. Call your doctor for medical advice about side effects. You may report side effects to FDA at 1-800-FDA-1088. Where should I keep my medicine? This medicine is only given in a clinic, doctor's office, or other health care setting and will not be stored at home. NOTE: This sheet is a summary. It may not cover all possible information. If you have questions about this medicine, talk to your doctor, pharmacist, or health care provider.  2021 Elsevier/Gold Standard (2017-07-31 16:10:44)

## 2020-06-04 NOTE — Patient Instructions (Addendum)
Take calcium 1200 mg per day, and vitamin-D, 400 units per day Please consider the treatment options we discussed, and let us know.       Fall Prevention in the Home, Adult Falls can cause injuries and can affect people from all age groups. There are many simple things that you can do to make your home safe and to help prevent falls. Ask for help when making these changes, if needed. What actions can I take to prevent falls? General instructions  Use good lighting in all rooms. Replace any light bulbs that burn out.  Turn on lights if it is dark. Use night-lights.  Place frequently used items in easy-to-reach places. Lower the shelves around your home if necessary.  Set up furniture so that there are clear paths around it. Avoid moving your furniture around.  Remove throw rugs and other tripping hazards from the floor.  Avoid walking on wet floors.  Fix any uneven floor surfaces.  Add color or contrast paint or tape to grab bars and handrails in your home. Place contrasting color strips on the first and last steps of stairways.  When you use a stepladder, make sure that it is completely opened and that the sides are firmly locked. Have someone hold the ladder while you are using it. Do not climb a closed stepladder.  Be aware of any and all pets. What can I do in the bathroom?  Keep the floor dry. Immediately clean up any water that spills onto the floor.  Remove soap buildup in the tub or shower on a regular basis.  Use non-skid mats or decals on the floor of the tub or shower.  Attach bath mats securely with double-sided, non-slip rug tape.  If you need to sit down while you are in the shower, use a plastic, non-slip stool.  Install grab bars by the toilet and in the tub and shower. Do not use towel bars as grab bars.      What can I do in the bedroom?  Make sure that a bedside light is easy to reach.  Do not use oversized bedding that drapes onto the  floor.  Have a firm chair that has side arms to use for getting dressed. What can I do in the kitchen?  Clean up any spills right away.  If you need to reach for something above you, use a sturdy step stool that has a grab bar.  Keep electrical cables out of the way.  Do not use floor polish or wax that makes floors slippery. If you must use wax, make sure that it is non-skid floor wax. What can I do in the stairways?  Do not leave any items on the stairs.  Make sure that you have a light switch at the top of the stairs and the bottom of the stairs. Have them installed if you do not have them.  Make sure that there are handrails on both sides of the stairs. Fix handrails that are broken or loose. Make sure that handrails are as long as the stairways.  Install non-slip stair treads on all stairs in your home.  Avoid having throw rugs at the top or bottom of stairways, or secure the rugs with carpet tape to prevent them from moving.  Choose a carpet design that does not hide the edge of steps on the stairway.  Check any carpeting to make sure that it is firmly attached to the stairs. Fix any carpet that is loose or  worn. What can I do on the outside of my home?  Use bright outdoor lighting.  Regularly repair the edges of walkways and driveways and fix any cracks.  Remove high doorway thresholds.  Trim any shrubbery on the main path into your home.  Regularly check that handrails are securely fastened and in good repair. Both sides of any steps should have handrails.  Install guardrails along the edges of any raised decks or porches.  Clear walkways of debris and clutter, including tools and rocks.  Have leaves, snow, and ice cleared regularly.  Use sand or salt on walkways during winter months.  In the garage, clean up any spills right away, including grease or oil spills. What other actions can I take?  Wear closed-toe shoes that fit well and support your feet. Wear  shoes that have rubber soles or low heels.  Use mobility aids as needed, such as canes, walkers, scooters, and crutches.  Review your medicines with your health care provider. Some medicines can cause dizziness or changes in blood pressure, which increase your risk of falling. Talk with your health care provider about other ways that you can decrease your risk of falls. This may include working with a physical therapist or trainer to improve your strength, balance, and endurance. Where to find more information  Centers for Disease Control and Prevention, [employer]: WebmailGuide.co.za  [employer] on Aging: BrainJudge.co.uk Contact a health care provider if:  You are afraid of falling at home.  You feel weak, drowsy, or dizzy at home.  You fall at home. Summary  There are many simple things that you can do to make your home safe and to help prevent falls.  Ways to make your home safe include removing tripping hazards and installing grab bars in the bathroom.  Ask for help when making these changes in your home. This information is not intended to replace advice given to you by your health care provider. Make sure you discuss any questions you have with your health care provider. Document Revised: 03/06/2017 Document Reviewed: 11/06/2016 Elsevier Patient Education  2021 [employer].   Zoledronic Acid Injection (Osteoporosis) What is this medicine? ZOLEDRONIC ACID (ZOE le dron ik AS id) slows calcium loss from bones. It treats osteoporosis. It may be used in other people at risk for bone loss. This medicine may be used for other purposes; ask your health care provider or pharmacist if you have questions. COMMON BRAND NAME(S): Reclast, Zometa What should I tell my health care provider before I take this medicine? They need to know if you have any of these conditions:  bleeding disorder  cancer  dental disease  kidney disease  low levels of calcium in the  blood  low red blood cell counts  lung or breathing disease (asthma)  receiving steroids like dexamethasone or prednisone  an unusual or allergic reaction to zoledronic acid, other medicines, foods, dyes, or preservatives  pregnant or trying to get pregnant  breast-feeding How should I use this medicine? This drug is injected into a vein. It is given by a health care provider in a hospital or clinic setting. A special MedGuide will be given to you before each treatment. Be sure to read this information carefully each time. Talk to your health care provider about the use of this drug in children. Special care may be needed. Overdosage: If you think you have taken too much of this medicine contact a poison control center or emergency room at once. NOTE: This medicine  is only for you. Do not share this medicine with others. What if I miss a dose? Keep appointments for follow-up doses. It is important not to miss your dose. Call your health care provider if you are unable to keep an appointment. What may interact with this medicine?  certain antibiotics given by injection  NSAIDs, medicines for pain and inflammation, like ibuprofen or naproxen  some diuretics like bumetanide, furosemide  teriparatide This list may not describe all possible interactions. Give your health care provider a list of all the medicines, herbs, non-prescription drugs, or dietary supplements you use. Also tell them if you smoke, drink alcohol, or use illegal drugs. Some items may interact with your medicine. What should I watch for while using this medicine? Visit your health care provider for regular checks on your progress. It may be some time before you see the benefit from this drug. Some people who take this drug have severe bone, joint, or muscle pain. This drug may also increase your risk for jaw problems or a broken thigh bone. Tell your health care provider right away if you have severe pain in your jaw,  bones, joints, or muscles. Tell you health care provider if you have any pain that does not go away or that gets worse. You should make sure you get enough calcium and vitamin D while you are taking this drug. Discuss the foods you eat and the vitamins you take with your health care provider. You may need blood work done while you are taking this drug. Tell your dentist and dental surgeon that you are taking this drug. You should not have major dental surgery while on this drug. See your dentist to have a dental exam and fix any dental problems before starting this drug. Take good care of your teeth while on this drug. Make sure you see your dentist for regular follow-up appointments. What side effects may I notice from receiving this medicine? Side effects that you should report to your doctor or health care provider as soon as possible:  allergic reactions (skin rash, itching or hives; swelling of the face, lips, or tongue)  bone pain  infection (fever, chills, cough, sore throat, pain or trouble passing urine)  jaw pain, especially after dental work  joint pain  kidney injury (trouble passing urine or change in the amount of urine)  low calcium levels (fast heartbeat; muscle cramps or pain; pain, tingling, or numbness in the hands or feet; seizures)  low red blood cell counts (trouble breathing; feeling faint; lightheaded, falls; unusually weak or tired)  muscle pain  palpitations  redness, blistering, peeling, or loosening of the skin, including inside the mouth Side effects that usually do not require medical attention (report to your doctor or health care provider if they continue or are bothersome):  diarrhea  eye irritation, itching, or pain  fever  general ill feeling or flu-like symptoms  headache  increase in blood pressure  nausea  pain, redness, or irritation at site where injected  stomach pain  upset stomach This list may not describe all possible side  effects. Call your doctor for medical advice about side effects. You may report side effects to FDA at 1-800-FDA-1088. Where should I keep my medicine? This drug is given in a hospital or clinic. It will not be stored at home. NOTE: This sheet is a summary. It may not cover all possible information. If you have questions about this medicine, talk to your doctor, pharmacist, or health care  provider.  2021 Elsevier/Gold Standard (2019-01-10 11:46:18)   Denosumab injection What is this medicine? DENOSUMAB (den oh sue mab) slows bone breakdown. Prolia is used to treat osteoporosis in women after menopause and in men, and in people who are taking corticosteroids for 6 months or more. Delton See is used to treat a high calcium level due to cancer and to prevent bone fractures and other bone problems caused by multiple myeloma or cancer bone metastases. Delton See is also used to treat giant cell tumor of the bone. This medicine may be used for other purposes; ask your health care provider or pharmacist if you have questions. COMMON BRAND NAME(S): Prolia, XGEVA What should I tell my health care provider before I take this medicine? They need to know if you have any of these conditions:  dental disease  having surgery or tooth extraction  infection  kidney disease  low levels of calcium or Vitamin D in the blood  malnutrition  on hemodialysis  skin conditions or sensitivity  thyroid or parathyroid disease  an unusual reaction to denosumab, other medicines, foods, dyes, or preservatives  pregnant or trying to get pregnant  breast-feeding How should I use this medicine? This medicine is for injection under the skin. It is given by a health care professional in a hospital or clinic setting. A special MedGuide will be given to you before each treatment. Be sure to read this information carefully each time. For Prolia, talk to your pediatrician regarding the use of this medicine in children.  Special care may be needed. For Delton See, talk to your pediatrician regarding the use of this medicine in children. While this drug may be prescribed for children as young as 13 years for selected conditions, precautions do apply. Overdosage: If you think you have taken too much of this medicine contact a poison control center or emergency room at once. NOTE: This medicine is only for you. Do not share this medicine with others. What if I miss a dose? It is important not to miss your dose. Call your doctor or health care professional if you are unable to keep an appointment. What may interact with this medicine? Do not take this medicine with any of the following medications:  other medicines containing denosumab This medicine may also interact with the following medications:  medicines that lower your chance of fighting infection  steroid medicines like prednisone or cortisone This list may not describe all possible interactions. Give your health care provider a list of all the medicines, herbs, non-prescription drugs, or dietary supplements you use. Also tell them if you smoke, drink alcohol, or use illegal drugs. Some items may interact with your medicine. What should I watch for while using this medicine? Visit your doctor or health care professional for regular checks on your progress. Your doctor or health care professional may order blood tests and other tests to see how you are doing. Call your doctor or health care professional for advice if you get a fever, chills or sore throat, or other symptoms of a cold or flu. Do not treat yourself. This drug may decrease your body's ability to fight infection. Try to avoid being around people who are sick. You should make sure you get enough calcium and vitamin D while you are taking this medicine, unless your doctor tells you not to. Discuss the foods you eat and the vitamins you take with your health care professional. See your dentist regularly.  Brush and floss your teeth as directed. Before you  have any dental work done, tell your dentist you are receiving this medicine. Do not become pregnant while taking this medicine or for 5 months after stopping it. Talk with your doctor or health care professional about your birth control options while taking this medicine. Women should inform their doctor if they wish to become pregnant or think they might be pregnant. There is a potential for serious side effects to an unborn child. Talk to your health care professional or pharmacist for more information. What side effects may I notice from receiving this medicine? Side effects that you should report to your doctor or health care professional as soon as possible:  allergic reactions like skin rash, itching or hives, swelling of the face, lips, or tongue  bone pain  breathing problems  dizziness  jaw pain, especially after dental work  redness, blistering, peeling of the skin  signs and symptoms of infection like fever or chills; cough; sore throat; pain or trouble passing urine  signs of low calcium like fast heartbeat, muscle cramps or muscle pain; pain, tingling, numbness in the hands or feet; seizures  unusual bleeding or bruising  unusually weak or tired Side effects that usually do not require medical attention (report to your doctor or health care professional if they continue or are bothersome):  constipation  diarrhea  headache  joint pain  loss of appetite  muscle pain  runny nose  tiredness  upset stomach This list may not describe all possible side effects. Call your doctor for medical advice about side effects. You may report side effects to FDA at 1-800-FDA-1088. Where should I keep my medicine? This medicine is only given in a clinic, doctor's office, or other health care setting and will not be stored at home. NOTE: This sheet is a summary. It may not cover all possible information. If you have questions  about this medicine, talk to your doctor, pharmacist, or health care provider.  2021 Elsevier/Gold Standard (2017-07-31 16:10:44)

## 2020-06-05 DIAGNOSIS — M810 Age-related osteoporosis without current pathological fracture: Secondary | ICD-10-CM | POA: Insufficient documentation

## 2020-06-25 ENCOUNTER — Encounter: Payer: Self-pay | Admitting: Cardiovascular Disease

## 2020-06-25 ENCOUNTER — Ambulatory Visit (INDEPENDENT_AMBULATORY_CARE_PROVIDER_SITE_OTHER): Payer: Medicare Other | Admitting: Cardiovascular Disease

## 2020-06-25 ENCOUNTER — Other Ambulatory Visit: Payer: Self-pay

## 2020-06-25 VITALS — BP 142/76 | HR 75 | Ht 62.5 in | Wt 135.0 lb

## 2020-06-25 DIAGNOSIS — I5032 Chronic diastolic (congestive) heart failure: Secondary | ICD-10-CM | POA: Diagnosis not present

## 2020-06-25 DIAGNOSIS — I2510 Atherosclerotic heart disease of native coronary artery without angina pectoris: Secondary | ICD-10-CM | POA: Diagnosis not present

## 2020-06-25 DIAGNOSIS — E7800 Pure hypercholesterolemia, unspecified: Secondary | ICD-10-CM | POA: Diagnosis not present

## 2020-06-25 NOTE — Progress Notes (Signed)
Chief Complaint  Patient presents with  . Follow-up    Chronic diastolic CHF    History of Present Illness: 65 yo female with history of CAD, NICM, COPD, HLD, tobacco abuse here today for cardiac follow up. I saw her as a new patient in April 2016. She was hospitalized in [place] December 2015 with COPD exacerbation. Troponin was mildly elevated at 2. Cardiac catheterization in 2015 showed no obstructive CAD. She was felt to have a COPD exacerbation and she improved with treatment of her COPD. She quit smoking in September 2015. Admitted to [facility] March 2017 with respiratory distress, CHF. Cardiac cath March 2017 with minimal CAD, severe LV systolic dysfunction (NOBS=96-28%). 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  She was diuresed with clinical improvement. She was seen several times in our office after discharge with dizziness and hypotension. Lisinopril and Lasix were decreased. She is followed in the pulmonary office by Dr. Elsworth [name]. Event monitor April 2017 with sinus, PACs, PVCs. Echo May 2018 with normal LV systolic function, mild AI. Most recent echo April 2021 with normal LV systolic function and mild AI. She was admitted to [facility] April 4-7, 2021 with a COPD exacerbation. I saw her in February 2022 and she reported a 5 lb weight gain over the prior week. She started taking Lasix every day and this drops her weight down 3 lbs but the next morning her weight is back up. Abdominal and hand swelling but no LE edema. Her heart rate is always in the low 100s. She has minimally worsened dyspnea. BNP was normal. Lasix was increased for several days.   She is here today for follow up. The patient denies any chest pain, dyspnea, palpitations, lower extremity edema, orthopnea, PND, dizziness, near syncope or syncope.    Primary Care Physician: Kim Peng, NP  Past Medical History:  Diagnosis Date  . C8  RADICULOPATHY 06/05/2009  . COPD 08/31/2007  . DEPRESSION 11/19/2006  . DYSLIPIDEMIA 03/12/2009  . MI (mitral incompetence)   . 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 OSTEOARTHRITIS 06/05/2009  . PARESTHESIA 08/24/2007  . TOBACCO ABUSE 01/25/2010    Past Surgical History:  Procedure Laterality Date  . ABDOMINAL HYSTERECTOMY    . APPENDECTOMY  1969  . CARDIAC CATHETERIZATION N/A 07/05/2015   Procedure: Left Heart Cath and Coronary Angiography;  Surgeon: Kim Man, MD;  Location: [facility];  Service: Cardiovascular;  Laterality: N/A;  . CHOLECYSTECTOMY  1989  . collapse lung  1990  . COLONOSCOPY WITH PROPOFOL N/A 05/14/2018   Procedure: COLONOSCOPY WITH PROPOFOL;  Surgeon: Kim Shipper, MD;  Location: [facility];  Service: Endoscopy;  Laterality: N/A;  . EXCISION MASS ABDOMINAL  2019  . EXPLORATORY LAPAROTOMY  1969  . POLYPECTOMY  05/14/2018   Procedure: POLYPECTOMY;  Surgeon: Kim Shipper, MD;  Location: [facility];  Service: Endoscopy;;  . SALPINGOOPHORECTOMY  2019    Current Outpatient Medications  Medication Sig Dispense Refill  . albuterol (VENTOLIN HFA) 108 (90 Base) MCG/ACT inhaler Take two puffs every 4-6 hours as needed for shortness of breath/wheezing . 90 day supply (Patient taking differently: Inhale 2 puffs into the lungs See admin instructions. Take two puffs every 4-6 hours as needed for shortness of breath/wheezing . 90 day supply) 162 g 1  . ALPRAZolam (XANAX) 0.5 MG tablet TAKE 1/2 TO 1 TABLET BY MOUTH THREE TIMES DAILY 60 tablet 2  . Ascorbic Acid (VITAMIN C PO)  Take 1 tablet by mouth daily.    Marland Kitchen aspirin EC 81 MG EC tablet Take 1 tablet (81 mg total) by mouth daily.    . budesonide-formoterol (SYMBICORT) 160-4.5 MCG/ACT inhaler Inhale 2 puffs into the lungs 2 (two) times daily. 30.6 g 6  . Calcium Carbonate-Vit D-Min (CALCIUM 1200 PO) Take 1 tablet by mouth daily.    . Cholecalciferol (VITAMIN D3) 50 MCG (2000 UT) TABS Take 2,000 Units by mouth daily.    .  cyanocobalamin 2000 MCG tablet Take 2,000 mcg by mouth daily. Vitamin B12    . furosemide (LASIX) 40 MG tablet Take 1 tablet (40 mg total) by mouth daily. 90 tablet 3  . Ginkgo Biloba Extract 60 MG CAPS Take 60 mg by mouth daily.    Marland Kitchen HYDROcodone-acetaminophen (NORCO) 7.5-325 MG tablet Take 1 tablet by mouth 2 (two) times daily as needed (pain/cough). 45 tablet 0  . ipratropium-albuterol (DUONEB) 0.5-2.5 (3) MG/3ML SOLN USE 3 ML VIA NEBULIZER EVERY 6 HOURS AS NEEDED.  DX: J44.9 360 mL 5  . losartan (COZAAR) 25 MG tablet TAKE 1 TABLET EVERY DAY 90 tablet 1  . magnesium oxide (MAG-OX) 400 MG tablet Take 400 mg by mouth daily.    . nitroGLYCERIN (NITROSTAT) 0.4 MG SL tablet Place 1 tablet (0.4 mg total) under the tongue every 5 (five) minutes as needed for chest pain. 25 tablet 3  . OXYGEN Inhale 2 L into the lungs continuous.    Marland Kitchen PARoxetine (PAXIL) 30 MG tablet TAKE 1 TABLET EVERY DAY 90 tablet 1  . Spacer/Aero Chamber Mouthpiece MISC 1 Device by Does not apply route as directed. 1 each 0  . Tiotropium Bromide Monohydrate (SPIRIVA RESPIMAT) 2.5 MCG/ACT AERS USE 2 INHALATIONS BY MOUTH  DAILY (Patient taking differently: Inhale 2 puffs into the lungs daily at 12 noon.) 12 g 3   No current facility-administered medications for this visit.    Allergies  Allergen Reactions  . Ciprofloxacin Other (See Comments)    Hallucinations   . Lisinopril Cough  . Penicillins Hives    Tolerates Omnicef Has patient had a PCN reaction causing immediate rash, facial/tongue/throat swelling, SOB or lightheadedness with hypotension: Yes Has patient had a PCN reaction causing severe rash involving mucus membranes or skin necrosis: No Has patient had a PCN reaction that required hospitalization No Has patient had a PCN reaction occurring within the last 10 years: No If all of the above answers are "NO", then may proceed with Cephalosporin use.     Social History   Socioeconomic History  . Marital status:  Married    Spouse name: Kim [name]  . Number of children: 2  . Years of education: Not on file  . Highest education level: Not on file  Occupational History  . Occupation: Patient care aide-sits with elderly lady  Tobacco Use  . Smoking status: Former Smoker    Packs/day: 2.00    Years: 38.00    Pack years: 76.00    Types: Cigarettes    Quit date: 03/07/2014    Years since quitting: 6.3  . Smokeless tobacco: Never Used  Vaping Use  . Vaping Use: Never used  Substance and Sexual Activity  . Alcohol use: No    Alcohol/week: 0.0 standard drinks  . Drug use: No  . Sexual activity: Not on file  Other Topics Concern  . Not on file  Social History Narrative   [facility] (01/12/17):   Originally from [facility]. Has lived in [place] for 17 years.  Married. Has 2 dogs currently. No mold and only 1 indoor plant. Had her home professionally cleaned a month ago. No bird or hot tub exposure. 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    Social Determinants of Health   Financial Resource Strain: Low Risk   . Difficulty of Paying Living Expenses: Not hard at all  Food Insecurity: No Food Insecurity  . Worried About Charity fundraiser in the Last Year: Never true  . Ran Out of Food in the Last Year: Never true  Transportation Needs: No Transportation Needs  . Lack of Transportation (Medical): No  . Lack of Transportation (Non-Medical): No  Physical Activity: Inactive  . Days of Exercise per Week: 0 days  . Minutes of Exercise per Session: 0 min  Stress: No Stress Concern Present  . Feeling of Stress : Not at all  Social Connections: Moderately Integrated  . Frequency of Communication with Friends and Family: More than three times a week  . Frequency of Social Gatherings with Friends and Family: More than three times a week  . Attends Religious Services: More than 4 times per year  . Active Member of Clubs or Organizations: No  . Attends Archivist Meetings:  Never  . Marital Status: Married  Human resources officer Violence: Not At Risk  . Fear of Current or Ex-Partner: No  . Emotionally Abused: No  . Physically Abused: No  . Sexually Abused: No    Family History  Problem Relation Age of Onset  . Cancer Mother        lung  . Heart attack Mother   . Hypertension Sister   . Multiple sclerosis Brother   . Diabetes Sister   . Rheum arthritis Sister   . Thyroid disease Sister   . Multiple sclerosis Other   . Alcohol abuse Other   . Arthritis Other   . Diabetes Other   . Kidney disease Other   . Cancer Other        lung,ovarian,skin, uterine  . Stroke Other   . Heart disease Other   . Melanoma Other   . Osteoporosis Other   . Heart attack Maternal Uncle   . Heart attack Other        NEICE  . Heart attack Other        NEPHEW  . Hypertension Brother   . Stroke Maternal Grandmother   . Colon cancer Neg Hx     Review of Systems:  As stated in the HPI and otherwise negative.   BP (!) 142/76   Pulse 75   Ht 5' 2.5" (1.588 m)   Wt 135 lb (61.2 kg)   SpO2 95%   BMI 24.30 kg/m   Physical Examination: General: Well developed, well nourished, NAD  HEENT: OP clear, mucus membranes moist  SKIN: warm, dry. No rashes. Neuro: No focal deficits  Musculoskeletal: Muscle strength 5/5 all ext  Psychiatric: Mood and affect normal  Neck: No JVD, no carotid bruits, no thyromegaly, no lymphadenopathy.  Lungs:Clear bilaterally, no wheezes, rhonci, crackles Cardiovascular: Regular rate and rhythm. No murmurs, gallops or rubs. Abdomen:Soft. Bowel sounds present. Non-tender.  Extremities: No lower extremity edema. Pulses are 2 + in the bilateral DP/PT.   Echo April 2021: 1. Left ventricular ejection fraction, by estimation, is 60 to 65%. The  left ventricle has normal function. The left ventricle has no regional  wall motion abnormalities. Left ventricular diastolic parameters are  consistent with Grade I  diastolic  dysfunction (impaired  relaxation).  2. Right ventricular systolic function is normal. The right ventricular  size is normal. Tricuspid regurgitation signal is inadequate for assessing  PA pressure.  3. The mitral valve is grossly normal. No evidence of mitral valve  regurgitation. No evidence of mitral stenosis.  4. The aortic valve is tricuspid. Aortic valve regurgitation is mild.  Mild aortic valve sclerosis is present, with no evidence of aortic valve  stenosis.  5. The inferior vena cava is normal in size with greater than 50%  respiratory variability, suggesting right atrial pressure of 3 mmHg.   EKG:  EKG is not  ordered today. The ekg ordered today demonstrates   Recent Labs: 09/17/2019: B Natriuretic Peptide 52.5 03/30/2020: ALT 28; Magnesium 2.5 04/03/2020: Hemoglobin 12.9; Platelets 283 05/22/2020: NT-Pro BNP 147 05/31/2020: BUN 25; Creatinine, Ser 0.83; Potassium 4.3; Sodium 137   Lipid Panel    Component Value Date/Time   CHOL 131 12/17/2018 0906   CHOL 221 (H) 01/25/2018 0811   TRIG 104.0 12/17/2018 0906   HDL 44.40 12/17/2018 0906   HDL 41 01/25/2018 0811   CHOLHDL 3 12/17/2018 0906   VLDL 20.8 12/17/2018 0906   LDLCALC 66 12/17/2018 0906   LDLCALC 151 (H) 01/25/2018 0811   LDLDIRECT 155.4 12/29/2012 1209     Wt Readings from Last 3 Encounters:  06/25/20 135 lb (61.2 kg)  06/04/20 134 lb (60.8 kg)  06/01/20 135 lb 3.2 oz (61.3 kg)     Other studies Reviewed: Additional studies/ records that were reviewed today include: Records from [facility]. Review of the above records demonstrates:   Assessment and Plan:   1. CAD without angina: No chest pain. She has mild CAD by cath in 2017. Continue ASA and statin  2. Hyperlipidemia: LDL at goal in 2020. Repeat lipids at next visit. Continue statin.   3.  Chronic diastolic CHF: LV function normal by echo April 2021. Weight is stable. She is off of the beta blocker due to her COPD. She will continue Lasix 40 mg daily with  extra 40 mg on days that her weight is up or there is swelling.   4. COPD: closely followed by pulmonary.  Current medicines are reviewed at length with the patient today.  The patient does not have concerns regarding medicines.  The following changes have been made:  no change  Labs/ tests ordered today include:   No orders of the defined types were placed in this encounter.   Disposition:   F/U with me in 1  months  Signed, Lauree Chandler, MD 06/25/2020 11:25 AM    [address], [place], [place]  [address] Phone: [phone]; Fax: [phone]

## 2020-06-25 NOTE — Patient Instructions (Signed)
Medication Instructions:  Your physician recommends that you continue on your current medications as directed. Please refer to the Current Medication list given to you today.  *If you need a refill on your cardiac medications before your next appointment, please call your pharmacy*   Lab Work: none If you have labs (blood work) drawn today and your tests are completely normal, you will receive your results only by: Marland Kitchen MyChart Message (if you have MyChart) OR . A paper copy in the mail If you have any lab test that is abnormal or we need to change your treatment, we will call you to review the results.   Testing/Procedures: none   Follow-Up: At [facility], you and your health needs are our priority.  As part of our continuing mission to provide you with exceptional heart care, we have created designated Provider Care Teams.  These Care Teams include your primary Cardiologist (physician) and Advanced Practice Providers (APPs -  Physician Assistants and Nurse Practitioners) who all work together to provide you with the care you need, when you need it.  We recommend signing up for the patient portal called "MyChart".  Sign up information is provided on this After Visit Summary.  MyChart is used to connect with patients for Virtual Visits (Telemedicine).  Patients are able to view lab/test results, encounter notes, upcoming appointments, etc.  Non-urgent messages can be sent to your provider as well.   To learn more about what you can do with MyChart, go to [email or web address].    Your next appointment:   6 month(s)  The format for your next appointment:   In Person  Provider:   Lauree Chandler, MD   Other Instructions

## 2020-07-12 ENCOUNTER — Other Ambulatory Visit: Payer: Self-pay

## 2020-07-13 ENCOUNTER — Encounter: Payer: Self-pay | Admitting: Adult Health

## 2020-07-13 ENCOUNTER — Ambulatory Visit (INDEPENDENT_AMBULATORY_CARE_PROVIDER_SITE_OTHER): Payer: Medicare Other | Admitting: Adult Health

## 2020-07-13 VITALS — BP 126/70 | HR 93 | Temp 98.5°F | Ht 62.5 in | Wt 134.6 lb

## 2020-07-13 DIAGNOSIS — F419 Anxiety disorder, unspecified: Secondary | ICD-10-CM | POA: Diagnosis not present

## 2020-07-13 DIAGNOSIS — I2510 Atherosclerotic heart disease of native coronary artery without angina pectoris: Secondary | ICD-10-CM

## 2020-07-13 DIAGNOSIS — J449 Chronic obstructive pulmonary disease, unspecified: Secondary | ICD-10-CM | POA: Diagnosis not present

## 2020-07-13 DIAGNOSIS — I5032 Chronic diastolic (congestive) heart failure: Secondary | ICD-10-CM

## 2020-07-13 DIAGNOSIS — F32A Depression, unspecified: Secondary | ICD-10-CM

## 2020-07-13 DIAGNOSIS — R52 Pain, unspecified: Secondary | ICD-10-CM

## 2020-07-13 DIAGNOSIS — E782 Mixed hyperlipidemia: Secondary | ICD-10-CM

## 2020-07-13 DIAGNOSIS — I10 Essential (primary) hypertension: Secondary | ICD-10-CM

## 2020-07-13 DIAGNOSIS — M159 Polyosteoarthritis, unspecified: Secondary | ICD-10-CM

## 2020-07-13 LAB — LIPID PANEL
Cholesterol: 254 mg/dL — ABNORMAL HIGH (ref 0–200)
HDL: 59.1 mg/dL (ref >39.00)
LDL Cholesterol: 156 mg/dL — ABNORMAL HIGH (ref 0–99)
NonHDL: 194.83
Total CHOL/HDL Ratio: 4
Triglycerides: 195 mg/dL — ABNORMAL HIGH (ref 0.0–149.0)
VLDL: 39 mg/dL (ref 0.0–40.0)

## 2020-07-13 LAB — CBC WITH DIFFERENTIAL/PLATELET
Basophils Absolute: 0.1 10*3/uL (ref 0.0–0.1)
Basophils Relative: 0.7 % (ref 0.0–3.0)
Eosinophils Absolute: 0.2 10*3/uL (ref 0.0–0.7)
Eosinophils Relative: 1.5 % (ref 0.0–5.0)
HCT: 42.7 % (ref 36.0–46.0)
Hemoglobin: 13.9 g/dL (ref 12.0–15.0)
Lymphocytes Relative: 35.6 % (ref 12.0–46.0)
Lymphs Abs: 4.6 10*3/uL — ABNORMAL HIGH (ref 0.7–4.0)
MCHC: 32.7 g/dL (ref 30.0–36.0)
MCV: 92.9 fl (ref 78.0–100.0)
Monocytes Absolute: 1.1 10*3/uL — ABNORMAL HIGH (ref 0.1–1.0)
Monocytes Relative: 8.2 % (ref 3.0–12.0)
Neutro Abs: 7 10*3/uL (ref 1.4–7.7)
Neutrophils Relative %: 54 % (ref 43.0–77.0)
Platelets: 326 10*3/uL (ref 150.0–400.0)
RBC: 4.59 Mil/uL (ref 3.87–5.11)
RDW: 14.8 % (ref 11.5–15.5)
WBC: 12.9 10*3/uL — ABNORMAL HIGH (ref 4.0–10.5)

## 2020-07-13 LAB — COMPREHENSIVE METABOLIC PANEL
ALT: 12 U/L (ref 0–35)
AST: 13 U/L (ref 0–37)
Albumin: 4.1 g/dL (ref 3.5–5.2)
Alkaline Phosphatase: 62 U/L (ref 39–117)
BUN: 17 mg/dL (ref 6–23)
CO2: 33 mEq/L — ABNORMAL HIGH (ref 19–32)
Calcium: 9.8 mg/dL (ref 8.4–10.5)
Chloride: 102 mEq/L (ref 96–112)
Creatinine, Ser: 0.85 mg/dL (ref 0.40–1.20)
GFR: 71.38 mL/min (ref >60.00)
Glucose, Bld: 84 mg/dL (ref 70–99)
Potassium: 4.4 mEq/L (ref 3.5–5.1)
Sodium: 141 mEq/L (ref 135–145)
Total Bilirubin: 0.3 mg/dL (ref 0.2–1.2)
Total Protein: 6.2 g/dL (ref 6.0–8.3)

## 2020-07-13 LAB — TSH: TSH: 1.38 u[IU]/mL (ref 0.35–4.50)

## 2020-07-13 MED ORDER — ALPRAZOLAM 0.5 MG PO TABS: ORAL_TABLET | ORAL | 2 refills | Status: DC

## 2020-07-13 NOTE — Progress Notes (Signed)
Subjective:    Patient ID: Kim [name], female    DOB: June 23, 1955, 65 y.o.   MRN: [id number]  HPI Patient presents for yearly preventative medicine examination. 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  COPD - is managed closely by Pulmonary -recent exacerbation in December 2021 where she was hospitalized for 10 days. She uses oxygen on unusual exertion and during sleep.  Also prescribed Symbicort and Spiriva. Uses Prednisone PRN   Hyperlipidemia/CAD - Cath in 2017 showed mild CAD with severe LV dysfunction and was felt to be due to CHF secondary to nonischemic cardiomyopathy.. Is followed by Cardiology on a routine basis. Most recent echo in April 2021 with normal LV function and Mild AI She is currently prescribed Crestor 20 mg daily and 81 mg aspirin.  She denies chest pain.  Will have occasional palpitations.  Chronic diastolic CHF -noticed by cardiology.  Prescribe Lasix 40 mg daily with an extra 40 mg on days that her weight is up or there is swelling Wt Readings from Last 3 Encounters:  07/13/20 134 lb 9.6 oz (61.1 kg)  06/25/20 135 lb (61.2 kg)  06/04/20 134 lb (60.8 kg)   Anxiety/Depression -controlled with Paxil 30 mg daily and Xanax 0.5 mg 3 times daily  Osteoarthritis - multiple sites. controlled with Norco 7.5-325 mg.  She follows up every 3 months for pain management visit with this Probation officer.  Her last visit was in November 2021.  Hypertension - controlled with Cozaar 25 mg daily and lasix 40 mg daily.   Osteoporosis - Decided against Fosamax and prolia. Will continue with Calcium and VIT D  All immunizations and health maintenance protocols were reviewed with the patient and needed orders were  placed.  Appropriate screening laboratory values were ordered for the patient including screening of hyperlipidemia, renal function and hepatic function. If indicated by BPH, a PSA was ordered.  Medication reconciliation,  past medical history, social history, problem list and allergies were reviewed in detail with the patient  Goals were established with regard to weight loss, exercise, and  diet in compliance with medications   Review of Systems  Constitutional: Negative.   HENT: Negative.   Eyes: Negative.   Respiratory: Positive for cough, shortness of breath and wheezing.   Cardiovascular: Negative.   Gastrointestinal: Negative.   Endocrine: Negative.   Genitourinary: Negative.   Musculoskeletal: Positive for arthralgias.  Allergic/Immunologic: Negative.   Neurological: Negative.   Hematological: Negative.   Psychiatric/Behavioral: The patient is nervous/anxious.   All other systems reviewed and are negative.  Past Medical History:  Diagnosis Date  . C8 RADICULOPATHY 06/05/2009  . COPD 08/31/2007  . DEPRESSION 11/19/2006  . DYSLIPIDEMIA 03/12/2009  . MI (mitral incompetence)   . 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 OSTEOARTHRITIS 06/05/2009  . PARESTHESIA 08/24/2007  . TOBACCO ABUSE 01/25/2010    Social History   Socioeconomic History  . Marital status: Married    Spouse name: [name]  . Number of children: 2  . Years of education: Not on file  . Highest education level: Not on file  Occupational History  . Occupation: Patient care aide-sits with elderly lady  Tobacco Use  . Smoking status: Former Smoker    Packs/day: 2.00    Years: 38.00    Pack years: 76.00    Types: Cigarettes  Quit date: 03/07/2014    Years since quitting: 6.3  . Smokeless tobacco: Never Used  Vaping Use  . Vaping Use: Never used  Substance and Sexual Activity  . Alcohol use: No    Alcohol/week: 0.0 standard drinks  . Drug use: No  . Sexual activity: Not on file  Other Topics Concern  . Not  on file  Social History Narrative   [facility] (01/12/17):   Originally from [facility]. Has lived in [place] for 17 years. Married. Has 2 dogs currently. No mold and only 1 indoor plant. Had her home professionally cleaned a month ago. No bird or hot tub exposure. 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    Social Determinants of Health   Financial Resource Strain: Low Risk   . Difficulty of Paying Living Expenses: Not hard at all  Food Insecurity: No Food Insecurity  . Worried About Charity fundraiser in the Last Year: Never true  . Ran Out of Food in the Last Year: Never true  Transportation Needs: No Transportation Needs  . Lack of Transportation (Medical): No  . Lack of Transportation (Non-Medical): No  Physical Activity: Inactive  . Days of Exercise per Week: 0 days  . Minutes of Exercise per Session: 0 min  Stress: No Stress Concern Present  . Feeling of Stress : Not at all  Social Connections: Moderately Integrated  . Frequency of Communication with Friends and Family: More than three times a week  . Frequency of Social Gatherings with Friends and Family: More than three times a week  . Attends Religious Services: More than 4 times per year  . Active Member of Clubs or Organizations: No  . Attends Archivist Meetings: Never  . Marital Status: Married  Human resources officer Violence: Not At Risk  . Fear of Current or Ex-Partner: No  . Emotionally Abused: No  . Physically Abused: No  . Sexually Abused: No    Past Surgical History:  Procedure Laterality Date  . ABDOMINAL HYSTERECTOMY    . APPENDECTOMY  1969  . CARDIAC CATHETERIZATION N/A 07/05/2015   Procedure: Left Heart Cath and Coronary Angiography;  Surgeon: Leonie Man, MD;  Location: [facility];  Service: Cardiovascular;  Laterality: N/A;  . CHOLECYSTECTOMY  1989  . collapse lung  1990  . COLONOSCOPY WITH PROPOFOL N/A 05/14/2018   Procedure: COLONOSCOPY WITH PROPOFOL;   Surgeon: Irene Shipper, MD;  Location: [facility];  Service: Endoscopy;  Laterality: N/A;  . EXCISION MASS ABDOMINAL  2019  . EXPLORATORY LAPAROTOMY  1969  . POLYPECTOMY  05/14/2018   Procedure: POLYPECTOMY;  Surgeon: Irene Shipper, MD;  Location: [facility];  Service: Endoscopy;;  . SALPINGOOPHORECTOMY  2019    Family History  Problem Relation Age of Onset  . Cancer Mother        lung  . Heart attack Mother   . Hypertension Sister   . Multiple sclerosis Brother   . Diabetes Sister   . Rheum arthritis Sister   . Thyroid disease Sister   . Multiple sclerosis Other   . Alcohol abuse Other   . Arthritis Other   . Diabetes Other   . Kidney disease Other   . Cancer Other        lung,ovarian,skin, uterine  . Stroke Other   . Heart disease Other   . Melanoma Other   . Osteoporosis Other   . Heart attack Maternal Uncle   .  Heart attack Other        NEICE  . Heart attack Other        NEPHEW  . Hypertension Brother   . Stroke Maternal Grandmother   . Colon cancer Neg Hx     Allergies  Allergen Reactions  . Ciprofloxacin Other (See Comments)    Hallucinations   . Lisinopril Cough  . Penicillins Hives    Tolerates Omnicef Has patient had a PCN reaction causing immediate rash, facial/tongue/throat swelling, SOB or lightheadedness with hypotension: Yes Has patient had a PCN reaction causing severe rash involving mucus membranes or skin necrosis: No Has patient had a PCN reaction that required hospitalization No Has patient had a PCN reaction occurring within the last 10 years: No If all of the above answers are "NO", then may proceed with Cephalosporin use.     Current Outpatient Medications on File Prior to Visit  Medication Sig Dispense Refill  . albuterol (VENTOLIN HFA) 108 (90 Base) MCG/ACT inhaler Take two puffs every 4-6 hours as needed for shortness of breath/wheezing . 90 day supply (Patient taking differently: Inhale 2 puffs into the lungs See admin instructions.  Take two puffs every 4-6 hours as needed for shortness of breath/wheezing . 90 day supply) 162 g 1  . Ascorbic Acid (VITAMIN C PO) Take 1 tablet by mouth daily.    Marland Kitchen aspirin EC 81 MG EC tablet Take 1 tablet (81 mg total) by mouth daily.    . budesonide-formoterol (SYMBICORT) 160-4.5 MCG/ACT inhaler Inhale 2 puffs into the lungs 2 (two) times daily. 30.6 g 6  . Calcium Carbonate-Vit D-Min (CALCIUM 1200 PO) Take 1 tablet by mouth daily.    . Cholecalciferol (VITAMIN D3) 50 MCG (2000 UT) TABS Take 2,000 Units by mouth daily.    . cyanocobalamin 2000 MCG tablet Take 2,000 mcg by mouth daily. Vitamin B12    . furosemide (LASIX) 40 MG tablet Take 1 tablet (40 mg total) by mouth daily. 90 tablet 3  . Ginkgo Biloba Extract 60 MG CAPS Take 60 mg by mouth daily.    Marland Kitchen HYDROcodone-acetaminophen (NORCO) 7.5-325 MG tablet Take 1 tablet by mouth 2 (two) times daily as needed (pain/cough). 45 tablet 0  . ipratropium-albuterol (DUONEB) 0.5-2.5 (3) MG/3ML SOLN USE 3 ML VIA NEBULIZER EVERY 6 HOURS AS NEEDED.  DX: J44.9 360 mL 5  . losartan (COZAAR) 25 MG tablet TAKE 1 TABLET EVERY DAY 90 tablet 1  . magnesium oxide (MAG-OX) 400 MG tablet Take 400 mg by mouth daily.    . nitroGLYCERIN (NITROSTAT) 0.4 MG SL tablet Place 1 tablet (0.4 mg total) under the tongue every 5 (five) minutes as needed for chest pain. 25 tablet 3  . OXYGEN Inhale 2 L into the lungs continuous.    Marland Kitchen PARoxetine (PAXIL) 30 MG tablet TAKE 1 TABLET EVERY DAY 90 tablet 1  . Spacer/Aero Chamber Mouthpiece MISC 1 Device by Does not apply route as directed. 1 each 0  . Tiotropium Bromide Monohydrate (SPIRIVA RESPIMAT) 2.5 MCG/ACT AERS USE 2 INHALATIONS BY MOUTH  DAILY (Patient taking differently: Inhale 2 puffs into the lungs daily at 12 noon.) 12 g 3   No current facility-administered medications on file prior to visit.    BP 126/70 (BP Location: Left Arm, Patient Position: Sitting, Cuff Size: Normal)   Pulse 93   Temp 98.5 F (36.9 C) (Oral)    Ht 5' 2.5" (1.588 m)   Wt 134 lb 9.6 oz (61.1 kg)   SpO2 93%  BMI 24.23 kg/m       Objective:   Physical Exam Vitals and nursing note reviewed.  Constitutional:      Appearance: Normal appearance.  HENT:     Right Ear: Tympanic membrane, ear canal and external ear normal. There is no impacted cerumen.     Left Ear: Tympanic membrane, ear canal and external ear normal. There is no impacted cerumen.     Nose: Congestion and rhinorrhea present.     Mouth/Throat:     Mouth: Mucous membranes are moist.     Pharynx: Oropharynx is clear.  Eyes:     Extraocular Movements: Extraocular movements intact.     Pupils: Pupils are equal, round, and reactive to light.  Cardiovascular:     Rate and Rhythm: Normal rate and regular rhythm.     Pulses: Normal pulses.     Heart sounds: Normal heart sounds.  Pulmonary:     Effort: Pulmonary effort is normal.     Breath sounds: Wheezing (trace throughout ) present.     Comments: 2 L via [place]  Abdominal:     General: Abdomen is flat.     Palpations: Abdomen is soft.  Musculoskeletal:        General: Tenderness (multiple joints) present. No swelling, deformity or signs of injury. Normal range of motion.     Right lower leg: No edema.     Left lower leg: No edema.  Skin:    General: Skin is warm and dry.     Capillary Refill: Capillary refill takes less than 2 seconds.  Neurological:     General: No focal deficit present.     Mental Status: She is alert and oriented to person, place, and time.  Psychiatric:        Mood and Affect: Mood normal.        Behavior: Behavior normal.        Thought Content: Thought content normal.       Assessment & Plan:  1. COPD GOLD III  - Continue with pulmonary plan of care  - CBC with Differential/Platelet; Future - Comprehensive metabolic panel; Future - Lipid panel; Future - TSH; Future - CBC with Differential/Platelet - Comprehensive metabolic panel - Lipid panel - TSH  2. Chronic diastolic CHF  (congestive heart failure) (HCC) - appears euvolemic today in the office  - Continue with lasix daily  - CBC with Differential/Platelet; Future - Comprehensive metabolic panel; Future - Lipid panel; Future - TSH; Future - CBC with Differential/Platelet - Comprehensive metabolic panel - Lipid panel - TSH  3. Essential hypertension - Controlled. No change in medications  - CBC with Differential/Platelet; Future - Comprehensive metabolic panel; Future - Lipid panel; Future - TSH; Future - CBC with Differential/Platelet - Comprehensive metabolic panel - Lipid panel - TSH  4. Anxiety and depression - Controlled.  - ALPRAZolam (XANAX) 0.5 MG tablet; TAKE 1/2 TO 1 TABLET BY MOUTH THREE TIMES DAILY  Dispense: 60 tablet; Refill: 2  5. Mixed hyperlipidemia - Continue with statin  - CBC with Differential/Platelet; Future - Comprehensive metabolic panel; Future - Lipid panel; Future - TSH; Future - CBC with Differential/Platelet - Comprehensive metabolic panel - Lipid panel - TSH  6. Coronary artery disease involving native heart without angina pectoris, unspecified vessel or lesion type - Continue statin and ASA - Follow up with Cardiology as directed - CBC with Differential/Platelet; Future - Comprehensive metabolic panel; Future - Lipid panel; Future - TSH; Future - CBC with Differential/Platelet -  Comprehensive metabolic panel - Lipid panel - TSH  7. Encounter for pain management - New pain contract signed and scanned into chart - Controlled substance database reviewed - no red flags noted.  - DRUG MONITOR, PANEL 1, SCREEN, URINE; Future - DRUG MONITOR, PANEL 1, SCREEN, URINE  8. Osteoarthritis of multiple joints, unspecified osteoarthritis type  - DRUG MONITOR, PANEL 1, SCREEN, URINE; Future   [name], NP

## 2020-07-14 LAB — DRUG MONITOR, PANEL 1, SCREEN, URINE
Amphetamines: NEGATIVE ng/mL (ref <500)
Barbiturates: NEGATIVE ng/mL (ref <300)
Benzodiazepines: POSITIVE ng/mL — AB (ref <100)
Cocaine Metabolite: NEGATIVE ng/mL (ref <150)
Creatinine: 120.4 mg/dL
Marijuana Metabolite: NEGATIVE ng/mL (ref <20)
Methadone Metabolite: NEGATIVE ng/mL (ref <100)
Opiates: POSITIVE ng/mL — AB (ref <100)
Oxidant: NEGATIVE ug/mL
Oxycodone: NEGATIVE ng/mL (ref <100)
Phencyclidine: NEGATIVE ng/mL (ref <25)
pH: 5.5 (ref 4.5–9.0)

## 2020-07-14 LAB — DM TEMPLATE

## 2020-07-17 ENCOUNTER — Ambulatory Visit (INDEPENDENT_AMBULATORY_CARE_PROVIDER_SITE_OTHER): Payer: Medicare Other | Admitting: Adult Health

## 2020-07-17 ENCOUNTER — Encounter: Payer: Self-pay | Admitting: Adult Health

## 2020-07-17 DIAGNOSIS — M8949 Other hypertrophic osteoarthropathy, multiple sites: Secondary | ICD-10-CM

## 2020-07-17 DIAGNOSIS — J441 Chronic obstructive pulmonary disease with (acute) exacerbation: Secondary | ICD-10-CM | POA: Diagnosis not present

## 2020-07-17 DIAGNOSIS — Z0289 Encounter for other administrative examinations: Secondary | ICD-10-CM

## 2020-07-17 MED ORDER — PREDNISONE 10 MG PO TABS: ORAL_TABLET | ORAL | 0 refills | Status: DC

## 2020-07-17 MED ORDER — HYDROCODONE-ACETAMINOPHEN 7.5-325 MG PO TABS: 1.0000 | ORAL_TABLET | Freq: Two times a day (BID) | ORAL | 0 refills | Status: DC | PRN

## 2020-07-17 MED ORDER — HYDROCODONE-ACETAMINOPHEN 7.5-325 MG PO TABS: 1.0000 | ORAL_TABLET | Freq: Four times a day (QID) | ORAL | 0 refills | Status: DC | PRN

## 2020-07-17 MED ORDER — AZITHROMYCIN 250 MG PO TABS: ORAL_TABLET | ORAL | 0 refills | Status: DC

## 2020-07-17 NOTE — Progress Notes (Signed)
Virtual Visit via Telephone Note  I connected with Kim [name] on 07/17/20 at  5:00 PM EDT by telephone and verified that I am speaking with the correct person using two identifiers.   I discussed the limitations, risks, security and privacy concerns of performing an evaluation and management service by telephone and the availability of in person appointments. I also discussed with the patient that there may be a patient responsible charge related to this service. The patient expressed understanding and agreed to proceed.  Location patient: home Location provider: work or home office Participants present for the call: patient, provider Patient did not have a visit in the prior 7 days to address this/these issue(s).   History of Present Illness: 65 year old female who  has a past medical history of C8 RADICULOPATHY (06/05/2009), COPD (08/31/2007), DEPRESSION (11/19/2006), DYSLIPIDEMIA (03/12/2009), MI (mitral incompetence), NEPHROLITHIASIS (01/05/2008), OSTEOARTHRITIS (06/05/2009), PARESTHESIA (08/24/2007), and TOBACCO ABUSE (01/25/2010).  She reports that she started to experience an acute exacerbation 3 days ago, as the week has progressed her symptoms have gotten worse.  She reports significant shortness of breath, coughing, and wheezing.  She has been using all of her medications as directed.  She tried reaching out to her pulmonologist but she cannot be seen for a week.  She was last seen 5 days ago for her physical exam.  We also reviewed her labs and she needs her pain medication sent in.  Controlled substance database reviewed with no red flags or discrepancies   Observations/Objective: Patient sounds cheerful and well on the phone. Patient is short of breath between sentences. Wheezing noted.  Speech and thought processing are grossly intact. Patient reported vitals:  Assessment and Plan: 1. COPD with acute exacerbation (HCC)  - azithromycin (ZITHROMAX Z-PAK) 250 MG tablet; Take 2  tablets on Day 1.  Then take 1 tablet daily.  Dispense: 6 tablet; Refill: 0 - predniSONE (DELTASONE) 10 MG tablet; 6 tabs X3 days, 5 tabs X3 days, 4 tabs X3 days, 3 tabs X3 days, 2 tabs X3 days, 1 tab X3 days  Dispense: 63 tablet; Refill: 0 - she knows to go the ER if breathing gets worse   2. Primary osteoarthritis involving multiple joints  - HYDROcodone-acetaminophen (NORCO) 7.5-325 MG tablet; Take 1 tablet by mouth 2 (two) times daily as needed (pain/cough).  Dispense: 45 tablet; Refill: 0 - HYDROcodone-acetaminophen (NORCO) 7.5-325 MG tablet; Take 1 tablet by mouth every 6 (six) hours as needed for moderate pain.  Dispense: 45 tablet; Refill: 0 - HYDROcodone-acetaminophen (NORCO) 7.5-325 MG tablet; Take 1 tablet by mouth every 6 (six) hours as needed for moderate pain.  Dispense: 45 tablet; Refill: 0  Follow Up Instructions:  I did not refer this patient for an OV in the next 24 hours for this/these issue(s).  I discussed the assessment and treatment plan with the patient. The patient was provided an opportunity to ask questions and all were answered. The patient agreed with the plan and demonstrated an understanding of the instructions.   The patient was advised to call back or seek an in-person evaluation if the symptoms worsen or if the condition fails to improve as anticipated.  I provided 20  minutes of non-face-to-face time during this encounter.   [name], NP

## 2020-08-10 ENCOUNTER — Ambulatory Visit (INDEPENDENT_AMBULATORY_CARE_PROVIDER_SITE_OTHER): Payer: Medicare Other | Admitting: Pulmonary Disease

## 2020-08-10 ENCOUNTER — Other Ambulatory Visit: Payer: Self-pay

## 2020-08-10 ENCOUNTER — Encounter: Payer: Self-pay | Admitting: Pulmonary Disease

## 2020-08-10 DIAGNOSIS — I2510 Atherosclerotic heart disease of native coronary artery without angina pectoris: Secondary | ICD-10-CM

## 2020-08-10 DIAGNOSIS — J9611 Chronic respiratory failure with hypoxia: Secondary | ICD-10-CM | POA: Diagnosis not present

## 2020-08-10 DIAGNOSIS — M8000XD Age-related osteoporosis with current pathological fracture, unspecified site, subsequent encounter for fracture with routine healing: Secondary | ICD-10-CM

## 2020-08-10 DIAGNOSIS — J441 Chronic obstructive pulmonary disease with (acute) exacerbation: Secondary | ICD-10-CM

## 2020-08-10 MED ORDER — BREZTRI AEROSPHERE 160-9-4.8 MCG/ACT IN AERO: 2.0000 | INHALATION_SPRAY | Freq: Two times a day (BID) | RESPIRATORY_TRACT | 3 refills | Status: DC

## 2020-08-10 MED ORDER — PREDNISONE 10 MG PO TABS: ORAL_TABLET | ORAL | 0 refills | Status: DC

## 2020-08-10 NOTE — Assessment & Plan Note (Signed)
We will keep prednisone prescription pending in the pharmacy, we discussed action plan for exacerbation. 34-month supply of Kim [name] will be provided with 3 refills, she has checked with her insurance and this is affordable compared to Symbicort and Spiriva

## 2020-08-10 NOTE — Assessment & Plan Note (Signed)
Continue oxygen at all times, discussed with her that [facility] is 5000 feet elevation and she would feel this and may require more oxygen

## 2020-08-10 NOTE — Patient Instructions (Addendum)
Prescription for [facility] [employer] for 3 months will be sent to mail order.  Prescription of prednisone will be kept pending in your pharmacy Prednisone 10 mg tabs Take 4 tabs  daily with food x 4 days, then 3 tabs daily x 4 days, then 2 tabs daily x 4 days, then 1 tab daily x4 days then stop. #40   I would recommend injection for osteoporosis -please get back with Dr. Ebony Hail

## 2020-08-10 NOTE — Addendum Note (Signed)
Addended by: Geanie Logan on: 08/10/2020 10:17 AM   Modules accepted: Orders

## 2020-08-10 NOTE — Progress Notes (Signed)
   Subjective:    Patient ID: Kim [name], female    DOB: August 02, 1955, 65 y.o.   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   COPD exacerbations: June 2020- COPD exacerbation/Prednisone + zpack September- prednisone PCP December 2020- Prednisone + zpack ? PCP January 18th 2021- COPD exacerbation/Prednisone + doxycycline  Feb 5th 2021- COPD exacerbation/Prednisone + Zpack  Feb 9th 2021- Prolonged COPD exacerbation requiring high dose prednisone/ CXR ordered  April 4th- Hospitalized for COPD exacerbation treated with IV steriods and oral prednisone taper 08/12/2019 - Zpack and extrended prednisone taper  09/2019>>low-dose steroids -tapered to off byend August.  01/2020 OV >> pred40 mg, settling at 10 03/2020 hospitalized for 10 days for COPD exacerbation She attributes this to her pneumonia shot   Prednisone was tapered to off 07/06/2020 However she had a flareup 4/12, could not reach me, and was given Z-Pak and prednisone taper by her PCP.  She feels back to baseline now.  She attributes this to pollen. She started taking Breztri and this does seem to help better than Symbicort and Spiriva. She saw endocrinologist, reviewed note, was recommended Prolia versus Fosamax versus other injectable and she is debating due to side effects Compliant with oxygen  They are planning a trip to [place], husband does not fly so they will not to drive  Significant tests/ events reviewed  01/2017 alpha-1 >> 102 nml level ,PI* M1N  11/2014: FVC 2.00 L (63%) FEV1 1.00 L (41%)ratio50 negative bronchodilator response TLC 5.06 L (103%) RV 140% DLCO uncorrected 51%  CT angiogram chest 12/18/2021Severe centrilobular emphysema with mild superimposed diffuse ground-glass opacity with an apical predominance, similar in comparison to prior study. Findings likely reflect smoking  related lung disease/respiratory bronchiolitis. New mild compression fracture deformity of L1 since10/2021 01/2020 LDCT - stable, T7 fracture 12/2018 LD CT chest - stable nodules  10/2017 CT chest-stable nodules, adrenal adenoma 10/2016 CT chest showed subcentimeter nodules which have been stable since 2017, right adrenal nodule was stable since 2015 and considered benign   Review of Systems neg for any significant sore throat, dysphagia, itching, sneezing, nasal congestion or excess/ purulent secretions, fever, chills, sweats, unintended wt loss, pleuritic or exertional cp, hempoptysis, orthopnea pnd or change in chronic leg swelling. Also denies presyncope, palpitations, heartburn, abdominal pain, nausea, vomiting, diarrhea or change in bowel or urinary habits, dysuria,hematuria, rash, arthralgias, visual complaints, headache, numbness weakness or ataxia.     Objective:   Physical Exam  Gen. Pleasant, well-nourished, in no distress ENT - no thrush, no pallor/icterus,no post nasal drip Neck: No JVD, no thyromegaly, no carotid bruits Lungs: no use of accessory muscles, no dullness to percussion, decreased breath sounds bilateral without rales or rhonchi  Cardiovascular: Rhythm regular, heart sounds  normal, no murmurs or gallops, no peripheral edema Musculoskeletal: No deformities, no cyanosis or clubbing        Assessment & Plan:

## 2020-08-10 NOTE — Assessment & Plan Note (Signed)
I emphasized to her the need to get on osteoporosis medication. She will discuss with endocrinologist

## 2020-08-17 ENCOUNTER — Telehealth: Payer: Self-pay | Admitting: Pulmonary Disease

## 2020-08-17 DIAGNOSIS — U071 COVID-19: Secondary | ICD-10-CM

## 2020-08-17 NOTE — Telephone Encounter (Signed)
I have called and went over Kim [name]'s recs with patient and she verbalized understanding. I went ahead and sent in order for covid infusion and informed patient their team will be reaching out to her. I informed patient if symptoms worse like more SOB, chest pain, more secretions or high fever to go to the ER. Patient verbalized understanding, nothing further needed.

## 2020-08-17 NOTE — Telephone Encounter (Signed)
Please place ambulatory referral to covid treatment team for monoclonal antibody infusion. She can take prednisone taper she has on hand. Use nebulizer every 6 hours for sob/wheezing.

## 2020-08-17 NOTE — Telephone Encounter (Signed)
Call returned to patient, confirmed DOB. Patient reports she was feeling achy, developed a runny nose, temp 100.5, and developed chills so she took a covid test and it is now positive. She has been taken tylenol every 4 hours temp is now 99.8. She reports nasal congestions and increased cough. She reports she is coughing up beige mucous. She was unsure if the cough was more related to her COPD. She reports using her breztri daily and nebulizer prn. Patient does have a taper of prednisone on stand by given to her by MD to use in case she needs it. Requesting recommendations.  Please advise. Thanks :)

## 2020-08-18 ENCOUNTER — Telehealth: Payer: Self-pay | Admitting: Oncology

## 2020-08-18 ENCOUNTER — Encounter: Payer: Self-pay | Admitting: Oncology

## 2020-08-18 NOTE — Telephone Encounter (Signed)
Called to discuss with patient about COVID-19 symptoms and the use of one of the available treatments for those with mild to moderate Covid symptoms and at a high risk of hospitalization.  Pt appears to qualify for outpatient treatment due to co-morbid conditions and/or a member of an at-risk group in accordance with the FDA Emergency Use Authorization.    Symptom onset: unsure Vaccinated: yes Booster? yes Immunocompromised? no Qualifiers:  Past Medical History:  Diagnosis Date  . C8 RADICULOPATHY 06/05/2009  . COPD 08/31/2007  . DEPRESSION 11/19/2006  . DYSLIPIDEMIA 03/12/2009  . MI (mitral incompetence)   . 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 OSTEOARTHRITIS 06/05/2009  . PARESTHESIA 08/24/2007  . TOBACCO ABUSE 01/25/2010    Unable to reach pt - Left VM and sent Mychart message  Jacquelin Hawking

## 2020-08-19 ENCOUNTER — Other Ambulatory Visit: Payer: Self-pay | Admitting: Nurse Practitioner

## 2020-08-19 DIAGNOSIS — I10 Essential (primary) hypertension: Secondary | ICD-10-CM

## 2020-08-19 DIAGNOSIS — J441 Chronic obstructive pulmonary disease with (acute) exacerbation: Secondary | ICD-10-CM

## 2020-08-19 DIAGNOSIS — U071 COVID-19: Secondary | ICD-10-CM

## 2020-08-19 NOTE — Progress Notes (Signed)
I connected by phone with Kim [name] on 08/19/2020 at 7:26 PM to discuss the potential use of a new treatment for mild to moderate COVID-19 viral infection in non-hospitalized patients.  This patient is a 65 y.o. female that meets the FDA criteria for Emergency Use Authorization of COVID monoclonal antibody bebtelovimab.  Has a (+) direct SARS-CoV-2 viral test result  Has mild or moderate COVID-19   Is NOT hospitalized due to COVID-19  Is within 10 days of symptom onset  Has at least one of the high risk factor(s) for progression to severe COVID-19 and/or hospitalization as defined in EUA.  Specific high risk criteria : Older age (>/= 67 yo), Cardiovascular disease or hypertension and Chronic Lung Disease   I have spoken and communicated the following to the patient or parent/caregiver regarding COVID monoclonal antibody treatment:  1. FDA has authorized the emergency use for the treatment of mild to moderate COVID-19 in adults and pediatric patients with positive results of direct SARS-CoV-2 viral testing who are 74 years of age and older weighing at least 40 kg, and who are at high risk for progressing to severe COVID-19 and/or hospitalization.  2. The significant known and potential risks and benefits of COVID monoclonal antibody, and the extent to which such potential risks and benefits are unknown.  3. Information on available alternative treatments and the risks and benefits of those alternatives, including clinical trials.  4. Patients treated with COVID monoclonal antibody should continue to self-isolate and use infection control measures (e.g., wear mask, isolate, social distance, avoid sharing personal items, clean and disinfect "high touch" surfaces, and frequent handwashing) according to CDC guidelines.   5. The patient or parent/caregiver has the option to accept or refuse COVID monoclonal antibody treatment.  6. Discussion about the monoclonal antibody infusion does not  ensure treatment. The patient will be placed on a list and scheduled according to risk, symptom onset and availability. A scheduler will reach to the patient to let them know if we can accommodate their infusion or not.  After reviewing this information with the patient, the patient has agreed to receive one of the available covid 19 monoclonal antibodies and will be provided an appropriate fact sheet prior to infusion. Jobe Gibbon, NP 08/19/2020 7:26 PM

## 2020-08-20 ENCOUNTER — Other Ambulatory Visit: Payer: Self-pay

## 2020-08-20 ENCOUNTER — Ambulatory Visit (INDEPENDENT_AMBULATORY_CARE_PROVIDER_SITE_OTHER): Payer: Medicare Other

## 2020-08-20 DIAGNOSIS — J441 Chronic obstructive pulmonary disease with (acute) exacerbation: Secondary | ICD-10-CM | POA: Diagnosis not present

## 2020-08-20 DIAGNOSIS — U071 COVID-19: Secondary | ICD-10-CM | POA: Diagnosis not present

## 2020-08-20 DIAGNOSIS — I10 Essential (primary) hypertension: Secondary | ICD-10-CM

## 2020-08-20 MED ORDER — FAMOTIDINE IN NACL 20-0.9 MG/50ML-% IV SOLN: 20.0000 mg | Freq: Once | INTRAVENOUS | Status: AC | PRN

## 2020-08-20 MED ORDER — SODIUM CHLORIDE 0.9 % IV SOLN: INTRAVENOUS | Status: DC | PRN

## 2020-08-20 MED ORDER — BEBTELOVIMAB 175 MG/2 ML IV (EUA)
175.0000 mg | Freq: Once | INTRAMUSCULAR | Status: AC
  Administered 2020-08-20: 175 mg via INTRAVENOUS

## 2020-08-20 MED ORDER — DIPHENHYDRAMINE HCL 50 MG/ML IJ SOLN
50.0000 mg | Freq: Once | INTRAMUSCULAR | Status: AC | PRN
Start: 2020-08-20 — End: 2020-08-20

## 2020-08-20 MED ORDER — METHYLPREDNISOLONE SODIUM SUCC 125 MG IJ SOLR: 125.0000 mg | Freq: Once | INTRAMUSCULAR | Status: AC | PRN

## 2020-08-20 MED ORDER — ALBUTEROL SULFATE HFA 108 (90 BASE) MCG/ACT IN AERS: 2.0000 | INHALATION_SPRAY | Freq: Once | RESPIRATORY_TRACT | Status: AC | PRN

## 2020-08-20 MED ORDER — EPINEPHRINE 0.3 MG/0.3ML IJ SOAJ
0.3000 mg | Freq: Once | INTRAMUSCULAR | Status: AC | PRN
Start: 2020-08-20 — End: 2020-08-20

## 2020-08-20 NOTE — Patient Instructions (Signed)
10 Things You Can Do to Manage Your COVID-19 Symptoms at Home If you have possible or confirmed COVID-19: 1. Stay home except to get medical care. 2. Monitor your symptoms carefully. If your symptoms get worse, call your healthcare provider immediately. 3. Get rest and stay hydrated. 4. If you have a medical appointment, call the healthcare provider ahead of time and tell them that you have or may have COVID-19. 5. For medical emergencies, call 911 and notify the dispatch personnel that you have or may have COVID-19. 6. Cover your cough and sneezes with a tissue or use the inside of your elbow. 7. Wash your hands often with soap and water for at least 20 seconds or clean your hands with an alcohol-based hand sanitizer that contains at least 60% alcohol. 8. As much as possible, stay in a specific room and away from other people in your home. Also, you should use a separate bathroom, if available. If you need to be around other people in or outside of the home, wear a mask. 9. Avoid sharing personal items with other people in your household, like dishes, towels, and bedding. 10. Clean all surfaces that are touched often, like counters, tabletops, and doorknobs. Use household cleaning sprays or wipes according to the label instructions. cdc.gov/coronavirus 10/21/2019 This information is not intended to replace advice given to you by your health care provider. Make sure you discuss any questions you have with your health care provider. Document Revised: 02/06/2020 Document Reviewed: 02/06/2020 Elsevier Patient Education  2021 Elsevier Inc.  What types of side effects do monoclonal antibody drugs cause?  Common side effects  In general, the more common side effects caused by monoclonal antibody drugs include: . Allergic reactions, such as hives or itching . Flu-like signs and symptoms, including chills, fatigue, fever, and muscle aches and pains . Nausea, vomiting . Diarrhea . Skin  rashes . Low blood pressure   The CDC is recommending patients who receive monoclonal antibody treatments wait at least 90 days before being vaccinated.  Currently, there are no data on the safety and efficacy of mRNA COVID-19 vaccines in persons who received monoclonal antibodies or convalescent plasma as part of COVID-19 treatment. Based on the estimated half-life of such therapies as well as evidence suggesting that reinfection is uncommon in the 90 days after initial infection, vaccination should be deferred for at least 90 days, as a precautionary measure until additional information becomes available, to avoid interference of the antibody treatment with vaccine-induced immune responses.  

## 2020-08-20 NOTE — Progress Notes (Signed)
Diagnosis: COVID  Provider:  Marshell Garfinkel, MD  Procedure: Infusion  IV Type: Peripheral, IV Location: L Forearm  Bebtelovimab, Dose: 175 mg  Infusion Start Time: 8177  Infusion Stop Time: 1165  Post Infusion IV Care: Observation period completed and Peripheral IV Discontinued  Discharge: Condition: Good, Destination: Home . AVS provided to patient.   Performed by:  Koren Shiver, RN

## 2020-08-23 ENCOUNTER — Other Ambulatory Visit: Payer: Self-pay | Admitting: Adult Health

## 2020-08-23 DIAGNOSIS — E2839 Other primary ovarian failure: Secondary | ICD-10-CM

## 2020-08-24 ENCOUNTER — Telehealth: Payer: Self-pay | Admitting: Pulmonary Disease

## 2020-08-24 MED ORDER — PREDNISONE 10 MG PO TABS: ORAL_TABLET | ORAL | 0 refills | Status: DC

## 2020-08-24 NOTE — Telephone Encounter (Signed)
Continue Breztri two puffs twice daily. She should have a neb machine, have her use that 3-4 times a day. Have her take mucinex 1200mg  twice a day. I send in additional prednisone 20mg  x additional 5 days; 10mg  x 5 days and then stop. Not a whole lot more we can do outpatient other than this. If her symptoms are worsening she should go to ED for evaluted

## 2020-08-24 NOTE — Telephone Encounter (Signed)
Primary Pulmonologist: Dr. Elsworth Soho  Last office visit and with whom: 08/10/2020 with Dr. Elsworth Soho What do we see them for (pulmonary problems): COPD, chronic respiratory failure,Osteoporosis  Last OV assessment/plan: see below  Was appointment offered to patient (explain)?     Prescription for [facility] for 3 months will be sent to mail order.  Prescription of prednisone will be kept pending in your pharmacy Prednisone 10 mg tabs Take 4 tabs  daily with food x 4 days, then 3 tabs daily x 4 days, then 2 tabs daily x 4 days, then 1 tab daily x4 days then stop. #40   I would recommend injection for osteoporosis -please get back with Dr. Ebony Hail    Reason for call: patient called and stated she is still having worsening SOB, that has been worse today, coughing with beige/yellow mucous production, and wheezing. No complaints of chest pain, fever, chills, N/V. Patient did receive covid infusion on 08/20/20. Oxygen on 4L wiith O2 around 92%. Patient has been taking pred taper and has 2 pills left. Patient is taking Breztri and Albuterol rescue every 5 hours. Patient is worried about breathing and does not want to have to go to hospital. Routing to [facility] as Dr. Elsworth Soho is off.  Beth, please advise. Thanks!  (examples of things to ask: : When did symptoms start? Fever? Cough? Productive? Color to sputum? More sputum than usual? Wheezing? Have you needed increased oxygen? Are you taking your respiratory medications? What over the counter measures have you tried?)  Allergies  Allergen Reactions  . Ciprofloxacin Other (See Comments)    Hallucinations   . Lisinopril Cough  . Penicillins Hives    Tolerates Omnicef Has patient had a PCN reaction causing immediate rash, facial/tongue/throat swelling, SOB or lightheadedness with hypotension: Yes Has patient had a PCN reaction causing severe rash involving mucus membranes or skin necrosis: No Has patient had a PCN reaction that required hospitalization No Has  patient had a PCN reaction occurring within the last 10 years: No If all of the above answers are "NO", then may proceed with Cephalosporin use.     Immunization History  Administered Date(s) Administered  . Influenza Split 01/31/2011, 01/27/2012, 03/25/2014  . Influenza Whole 01/05/2008, 03/12/2009, 03/13/2010  . Influenza,inj,Quad PF,6+ Mos 12/29/2012, 12/20/2014, 01/07/2017, 12/17/2017, 12/17/2018, 01/09/2020  . Influenza-Unspecified 01/31/2011, 01/27/2012, 12/29/2012, 03/25/2014, 12/20/2014, 02/11/2016, 01/07/2017, 12/17/2017, 12/17/2018  . PFIZER(Purple Top)SARS-COV-2 Vaccination 06/29/2019, 07/25/2019  . Pneumococcal Conjugate-13 08/10/2015  . Pneumococcal Polysaccharide-23 05/08/2006, 03/22/2020  . Tdap 01/31/2011

## 2020-08-24 NOTE — Telephone Encounter (Signed)
Spoke with the pt and notified of response per Beth. She verbalized understanding. Nothing further needed.  ?

## 2020-08-28 ENCOUNTER — Other Ambulatory Visit: Payer: Medicare Other

## 2020-08-28 ENCOUNTER — Ambulatory Visit: Payer: Medicare Other

## 2020-09-18 ENCOUNTER — Other Ambulatory Visit: Payer: Self-pay

## 2020-09-18 ENCOUNTER — Encounter: Payer: Self-pay | Admitting: Family Medicine

## 2020-09-18 ENCOUNTER — Ambulatory Visit (INDEPENDENT_AMBULATORY_CARE_PROVIDER_SITE_OTHER): Payer: Medicare Other | Admitting: Family Medicine

## 2020-09-18 VITALS — BP 130/62 | HR 97 | Temp 98.1°F | Wt 136.8 lb

## 2020-09-18 DIAGNOSIS — I2510 Atherosclerotic heart disease of native coronary artery without angina pectoris: Secondary | ICD-10-CM

## 2020-09-18 DIAGNOSIS — B370 Candidal stomatitis: Secondary | ICD-10-CM

## 2020-09-18 DIAGNOSIS — R220 Localized swelling, mass and lump, head: Secondary | ICD-10-CM | POA: Diagnosis not present

## 2020-09-18 MED ORDER — NYSTATIN 100000 UNIT/ML MT SUSP: 5.0000 mL | Freq: Four times a day (QID) | OROMUCOSAL | 0 refills | Status: DC

## 2020-09-18 NOTE — Progress Notes (Signed)
Established Patient Office Visit  Subjective:  Patient ID: Kim [name], female    DOB: 1956-03-11  Age: 65 y.o. MRN: [id number]  CC:  Chief Complaint  Patient presents with   Cyst    Knott under the left ear, x 3 months, just recently became uncomfortable, no pain, slight pressure and irritation, is having vertigo off and on    HPI Kim [name] presents for possible swelling and prominence left face just underneath and anterior to the ear region.  She described a "knot under her left ear noted about 3 months ago.  Occasionally feels irritated and sore.  She is not noted any overlying erythema.  No pain with chewing.  Has occasional vertigo symptoms but these seem to be more chronic.  She has not noted that eating seems to trigger any swelling.  She has chronic problems including diastolic heart failure, hypertension, COPD with chronic oxygen therapy, osteoporosis.  She had recent COPD exacerbation and recently finished up oral prednisone taper.  She is also on steroid inhaler.  She recently noticed some whitish patches in her posterior pharynx.  No dysphagia.  No pain with swallowing.  No major discomfort.  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   OSTEOARTHRITIS 06/05/2009   PARESTHESIA 08/24/2007   TOBACCO ABUSE 01/25/2010    Past Surgical History:  Procedure Laterality Date   ABDOMINAL HYSTERECTOMY     APPENDECTOMY  1969   CARDIAC CATHETERIZATION N/A 07/05/2015   Procedure: Left Heart Cath and Coronary Angiography;  Surgeon: Leonie Man, MD;  Location: [facility];  Service: Cardiovascular;  Laterality: N/A;   CHOLECYSTECTOMY  1989   collapse lung  1990   COLONOSCOPY WITH PROPOFOL N/A 05/14/2018   Procedure: COLONOSCOPY WITH PROPOFOL;  Surgeon: Irene Shipper, MD;  Location: [facility];  Service: Endoscopy;   Laterality: N/A;   EXCISION MASS ABDOMINAL  2019   EXPLORATORY LAPAROTOMY  1969   POLYPECTOMY  05/14/2018   Procedure: POLYPECTOMY;  Surgeon: Irene Shipper, MD;  Location: [facility];  Service: Endoscopy;;   SALPINGOOPHORECTOMY  2019    Family History  Problem Relation Age of Onset   Cancer Mother        lung   Heart attack Mother    Hypertension Sister    Multiple sclerosis Brother    Diabetes Sister    Rheum arthritis Sister    Thyroid disease Sister    Multiple sclerosis Other    Alcohol abuse Other    Arthritis Other    Diabetes Other    Kidney disease Other    Cancer Other        lung,ovarian,skin, uterine   Stroke Other    Heart disease Other    Melanoma Other    Osteoporosis Other    Heart attack Maternal Uncle    Heart attack Other        NEICE   Heart attack Other        NEPHEW   Hypertension Brother    Stroke Maternal Grandmother    Colon cancer Neg Hx     Social History   Socioeconomic History   Marital status: Married    Spouse name: [name]   Number of children: 2   Years of education: Not on file   Highest education level: Not on file  Occupational History  Occupation: Patient care aide-sits with elderly lady  Tobacco Use   Smoking status: Former    Packs/day: 2.00    Years: 38.00    Pack years: 76.00    Types: Cigarettes    Quit date: 03/07/2014    Years since quitting: 6.5   Smokeless tobacco: Never  Vaping Use   Vaping Use: Never used  Substance and Sexual Activity   Alcohol use: No    Alcohol/week: 0.0 standard drinks   Drug use: No   Sexual activity: Not on file  Other Topics Concern   Not on file  Social History Narrative   [facility] (01/12/17):   Originally from [facility]. Has lived in [place] for 17 years. Married. Has 2 dogs currently. No mold and only 1 indoor plant. Had her home professionally cleaned a month ago. No bird or hot tub exposure. 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    Social  Determinants of Health   Financial Resource Strain: Low Risk    Difficulty of Paying Living Expenses: Not hard at all  Food Insecurity: No Food Insecurity   Worried About Charity fundraiser in the Last Year: Never true   [place] in the Last Year: Never true  Transportation Needs: No Transportation Needs   Lack of Transportation (Medical): No   Lack of Transportation (Non-Medical): No  Physical Activity: Inactive   Days of Exercise per Week: 0 days   Minutes of Exercise per Session: 0 min  Stress: No Stress Concern Present   Feeling of Stress : Not at all  Social Connections: Moderately Integrated   Frequency of Communication with Friends and Family: More than three times a week   Frequency of Social Gatherings with Friends and Family: More than three times a week   Attends Religious Services: More than 4 times per year   Active Member of [employer] or Organizations: No   Attends Archivist Meetings: Never   Marital Status: Married  Human resources officer Violence: Not At Risk   Fear of Current or Ex-Partner: No   Emotionally Abused: No   Physically Abused: No   Sexually Abused: No    Outpatient Medications Prior to Visit  Medication Sig Dispense Refill   albuterol (VENTOLIN HFA) 108 (90 Base) MCG/ACT inhaler Take two puffs every 4-6 hours as needed for shortness of breath/wheezing . 90 day supply (Patient taking differently: Inhale 2 puffs into the lungs See admin instructions. Take two puffs every 4-6 hours as needed for shortness of breath/wheezing . 90 day supply) 162 g 1   ALPRAZolam (XANAX) 0.5 MG tablet TAKE 1/2 TO 1 TABLET BY MOUTH THREE TIMES DAILY 60 tablet 2   Ascorbic Acid (VITAMIN C PO) Take 1 tablet by mouth daily.     aspirin EC 81 MG EC tablet Take 1 tablet (81 mg total) by mouth daily.     Budeson-Glycopyrrol-Formoterol (BREZTRI AEROSPHERE) 160-9-4.8 MCG/ACT AERO Inhale 2 puffs into the lungs in the morning and at bedtime. 32.1 g 3   Calcium Carbonate-Vit  D-Min (CALCIUM 1200 PO) Take 1 tablet by mouth daily.     Cholecalciferol (VITAMIN D3) 50 MCG (2000 UT) TABS Take 2,000 Units by mouth daily.     cyanocobalamin 2000 MCG tablet Take 2,000 mcg by mouth daily. Vitamin B12     furosemide (LASIX) 40 MG tablet Take 1 tablet (40 mg total) by mouth daily. 90 tablet 3   Ginkgo Biloba Extract 60 MG CAPS  Take 60 mg by mouth daily.     HYDROcodone-acetaminophen (NORCO) 7.5-325 MG tablet Take 1 tablet by mouth 2 (two) times daily as needed (pain/cough). 45 tablet 0   HYDROcodone-acetaminophen (NORCO) 7.5-325 MG tablet Take 1 tablet by mouth every 6 (six) hours as needed for moderate pain. 45 tablet 0   HYDROcodone-acetaminophen (NORCO) 7.5-325 MG tablet Take 1 tablet by mouth every 6 (six) hours as needed for moderate pain. 45 tablet 0   ipratropium-albuterol (DUONEB) 0.5-2.5 (3) MG/3ML SOLN USE 3 ML VIA NEBULIZER EVERY 6 HOURS AS NEEDED.  DX: J44.9 360 mL 5   losartan (COZAAR) 25 MG tablet TAKE 1 TABLET EVERY DAY 90 tablet 1   magnesium oxide (MAG-OX) 400 MG tablet Take 400 mg by mouth daily.     nitroGLYCERIN (NITROSTAT) 0.4 MG SL tablet Place 1 tablet (0.4 mg total) under the tongue every 5 (five) minutes as needed for chest pain. 25 tablet 3   OXYGEN Inhale 2 L into the lungs continuous.     PARoxetine (PAXIL) 30 MG tablet TAKE 1 TABLET EVERY DAY 90 tablet 1   predniSONE (DELTASONE) 10 MG tablet Take 2 tabs x 5 days; 1 tab x 5 days; then stop 15 tablet 0   Spacer/Aero Chamber Mouthpiece MISC 1 Device by Does not apply route as directed. 1 each 0   Facility-Administered Medications Prior to Visit  Medication Dose Route Frequency Provider Last Rate Last Admin   0.9 %  sodium chloride infusion   Intravenous PRN Pickenpack-Cousar, Carlena Sax, NP        Allergies  Allergen Reactions   Ciprofloxacin Other (See Comments)    Hallucinations    Lisinopril Cough   Penicillins Hives    Tolerates Omnicef Has patient had a PCN reaction causing immediate rash,  facial/tongue/throat swelling, SOB or lightheadedness with hypotension: Yes Has patient had a PCN reaction causing severe rash involving mucus membranes or skin necrosis: No Has patient had a PCN reaction that required hospitalization No Has patient had a PCN reaction occurring within the last 10 years: No If all of the above answers are "NO", then may proceed with Cephalosporin use.     ROS Review of Systems  Constitutional:  Negative for appetite change, chills, fever and unexpected weight change.  HENT:  Negative for sinus pain, sore throat, trouble swallowing and voice change.   Cardiovascular:  Negative for chest pain.     Objective:    Physical Exam Vitals reviewed.  HENT:     Ears:     Comments: Facial exam reveals no masses.  Area of concern is just inferior and anterior to the ear in the parotid region.  We do not feel any distinct masses.  No overlying erythema.  Nontender.  No warmth.  No TMJ tenderness.  Ear canal and eardrum appear normal.  No mastoid tenderness.    Mouth/Throat:     Comments: Oropharynx reveals some whitish patches on her hard and soft palate region consistent with thrush Cardiovascular:     Rate and Rhythm: Normal rate and regular rhythm.  Musculoskeletal:     Cervical back: Neck supple.  Lymphadenopathy:     Cervical: No cervical adenopathy.  Neurological:     Mental Status: She is alert.    BP 130/62 (BP Location: Left Arm, Patient Position: Sitting, Cuff Size: Normal)   Pulse 97   Temp 98.1 F (36.7 C) (Oral)   Wt 136 lb 12.8 oz (62.1 kg)   SpO2 96%   BMI  24.23 kg/m  Wt Readings from Last 3 Encounters:  09/18/20 136 lb 12.8 oz (62.1 kg)  08/10/20 131 lb 12.8 oz (59.8 kg)  07/13/20 134 lb 9.6 oz (61.1 kg)     Health Maintenance Due  Topic Date Due   Hepatitis C Screening  Never done   Zoster Vaccines- Shingrix (1 of 2) Never done   MAMMOGRAM  08/08/2018   DEXA SCAN  Never done   COVID-19 Vaccine (3 - Booster for Pfizer series)  12/25/2019    There are no preventive care reminders to display for this patient.  Lab Results  Component Value Date   TSH 1.38 07/13/2020   Lab Results  Component Value Date   WBC 12.9 (H) 07/13/2020   HGB 13.9 07/13/2020   HCT 42.7 07/13/2020   MCV 92.9 07/13/2020   PLT 326.0 07/13/2020   Lab Results  Component Value Date   NA 141 07/13/2020   K 4.4 07/13/2020   CO2 33 (H) 07/13/2020   GLUCOSE 84 07/13/2020   BUN 17 07/13/2020   CREATININE 0.85 07/13/2020   BILITOT 0.3 07/13/2020   ALKPHOS 62 07/13/2020   AST 13 07/13/2020   ALT 12 07/13/2020   PROT 6.2 07/13/2020   ALBUMIN 4.1 07/13/2020   CALCIUM 9.8 07/13/2020   ANIONGAP 8 04/02/2020   GFR 71.38 07/13/2020   Lab Results  Component Value Date   CHOL 254 (H) 07/13/2020   Lab Results  Component Value Date   HDL 59.10 07/13/2020   Lab Results  Component Value Date   LDLCALC 156 (H) 07/13/2020   Lab Results  Component Value Date   TRIG 195.0 (H) 07/13/2020   Lab Results  Component Value Date   CHOLHDL 4 07/13/2020   Lab Results  Component Value Date   HGBA1C 6.0 03/22/2018      Assessment & Plan:   #1 oral thrush.  Risk factors including recent oral prednisone use as well as steroid inhaler.  Patient inconsistent with mouth rinses  -Nystatin oral suspension 5 mL swish gargle and keep suspended in mouth as long as possible and use 4 times daily and follow-up if symptoms not clearing in the next week  #2 patient complaining of left facial fullness.  No masses palpated.  Area in question is parotid but cannot palpate any obvious swelling or mass.  No cervical lymphadenopathy.  Observe for now and be in touch with [place] for any changes   Meds ordered this encounter  Medications   nystatin (MYCOSTATIN) 100000 UNIT/ML suspension    Sig: Take 5 mLs (500,000 Units total) by mouth 4 (four) times daily.    Dispense:  240 mL    Refill:  0    Follow-up: No follow-ups on file.    Carolann Littler, MD

## 2020-09-19 ENCOUNTER — Emergency Department (HOSPITAL_COMMUNITY): Payer: Medicare Other

## 2020-09-19 ENCOUNTER — Emergency Department (HOSPITAL_COMMUNITY)
Admission: EM | Admit: 2020-09-19 | Discharge: 2020-09-20 | Disposition: A | Discharge status: Home / Self Care | Payer: Medicare Other | Attending: Emergency Medicine | Admitting: Emergency Medicine

## 2020-09-19 ENCOUNTER — Encounter (HOSPITAL_COMMUNITY): Payer: Self-pay

## 2020-09-19 ENCOUNTER — Other Ambulatory Visit: Payer: Self-pay

## 2020-09-19 DIAGNOSIS — R0602 Shortness of breath: Secondary | ICD-10-CM | POA: Diagnosis present

## 2020-09-19 DIAGNOSIS — J441 Chronic obstructive pulmonary disease with (acute) exacerbation: Secondary | ICD-10-CM | POA: Diagnosis not present

## 2020-09-19 DIAGNOSIS — R1010 Upper abdominal pain, unspecified: Secondary | ICD-10-CM | POA: Insufficient documentation

## 2020-09-19 DIAGNOSIS — R0789 Other chest pain: Secondary | ICD-10-CM

## 2020-09-19 LAB — CBC WITH DIFFERENTIAL/PLATELET
Abs Immature Granulocytes: 0.09 10*3/uL — ABNORMAL HIGH (ref 0.00–0.07)
Basophils Absolute: 0.1 10*3/uL (ref 0.0–0.1)
Basophils Relative: 1 %
Eosinophils Absolute: 0.2 10*3/uL (ref 0.0–0.5)
Eosinophils Relative: 1 %
HCT: 41.8 % (ref 36.0–46.0)
Hemoglobin: 13.4 g/dL (ref 12.0–15.0)
Immature Granulocytes: 1 %
Lymphocytes Relative: 25 %
Lymphs Abs: 3.6 10*3/uL (ref 0.7–4.0)
MCH: 30.5 pg (ref 26.0–34.0)
MCHC: 32.1 g/dL (ref 30.0–36.0)
MCV: 95 fL (ref 80.0–100.0)
Monocytes Absolute: 1.2 10*3/uL — ABNORMAL HIGH (ref 0.1–1.0)
Monocytes Relative: 8 %
Neutro Abs: 9.2 10*3/uL — ABNORMAL HIGH (ref 1.7–7.7)
Neutrophils Relative %: 64 %
Platelets: 271 10*3/uL (ref 150–400)
RBC: 4.4 MIL/uL (ref 3.87–5.11)
RDW: 15.7 % — ABNORMAL HIGH (ref 11.5–15.5)
WBC: 14.4 10*3/uL — ABNORMAL HIGH (ref 4.0–10.5)
nRBC: 0 % (ref 0.0–0.2)

## 2020-09-19 LAB — BLOOD GAS, VENOUS
Acid-Base Excess: 1.2 mmol/L (ref 0.0–2.0)
Bicarbonate: 26.4 mmol/L (ref 20.0–28.0)
O2 Saturation: 96 %
Patient temperature: 98.6
pCO2, Ven: 46.4 mmHg (ref 44.0–60.0)
pH, Ven: 7.374 (ref 7.250–7.430)
pO2, Ven: 81.8 mmHg — ABNORMAL HIGH (ref 32.0–45.0)

## 2020-09-19 LAB — COMPREHENSIVE METABOLIC PANEL
ALT: 18 U/L (ref 0–44)
AST: 19 U/L (ref 15–41)
Albumin: 4.1 g/dL (ref 3.5–5.0)
Alkaline Phosphatase: 60 U/L (ref 38–126)
Anion gap: 6 (ref 5–15)
BUN: 14 mg/dL (ref 8–23)
CO2: 28 mmol/L (ref 22–32)
Calcium: 9.3 mg/dL (ref 8.9–10.3)
Chloride: 105 mmol/L (ref 98–111)
Creatinine, Ser: 0.75 mg/dL (ref 0.44–1.00)
GFR, Estimated: >60 mL/min (ref >60)
Glucose, Bld: 92 mg/dL (ref 70–99)
Potassium: 4 mmol/L (ref 3.5–5.1)
Sodium: 139 mmol/L (ref 135–145)
Total Bilirubin: 0.6 mg/dL (ref 0.3–1.2)
Total Protein: 6.6 g/dL (ref 6.5–8.1)

## 2020-09-19 LAB — LIPASE, BLOOD: Lipase: 22 U/L (ref 11–51)

## 2020-09-19 LAB — TROPONIN I (HIGH SENSITIVITY): Troponin I (High Sensitivity): 7 ng/L (ref <18)

## 2020-09-19 LAB — MAGNESIUM: Magnesium: 2 mg/dL (ref 1.7–2.4)

## 2020-09-19 LAB — D-DIMER, QUANTITATIVE: D-Dimer, Quant: 0.42 ug/mL-FEU (ref 0.00–0.50)

## 2020-09-19 MED ORDER — ALBUTEROL SULFATE HFA 108 (90 BASE) MCG/ACT IN AERS
4.0000 | INHALATION_SPRAY | RESPIRATORY_TRACT | Status: AC
  Administered 2020-09-19 – 2020-09-20 (×3): 4 via RESPIRATORY_TRACT
  Filled 2020-09-19: qty 6.7

## 2020-09-19 NOTE — ED Provider Notes (Signed)
Emergency Medicine Provider Triage Evaluation Note  Kim [name] , a 65 y.o. female  was evaluated in triage.  Pt complains of chest pain present upon waking this morning.  Pain is sharp, all along the lower chest and upper abdomen, constant, worse with movement.  She is on home O2, as needed.  She typically uses it with ambulation or exertion, but has been using 2 L supplemental O2 even at rest today. She thought perhaps she was having pain from her ribs, was seen by orthopedics today, and was advised to come to the ED after that visit.  Review of Systems  Positive: Chest pain, shortness of breath Negative: Cough, nausea, vomiting, diarrhea, other abdominal pain, acute back pain, numbness, weakness.  Physical Exam  BP 128/73 (BP Location: Left Arm)   Pulse 81   Temp 97.8 F (36.6 C) (Oral)   Resp 18   SpO2 96%  Gen:   Awake, no distress   Resp:  Normal effort lung sounds clear and equal MSK:   Moves extremities without difficulty, no noted lower extremity edema or calf pain/tenderness Other:  Tender all along the upper abdomen, worst in the epigastric region.  Medical Decision Making  Medically screening exam initiated at 5:19 PM.  Appropriate orders placed.  [name] was informed that the remainder of the evaluation will be completed by another provider, this initial triage assessment does not replace that evaluation, and the importance of remaining in the ED until their evaluation is complete.     [name], PA-C 09/19/20 1732    [name], MD 09/20/20 [phone]

## 2020-09-19 NOTE — ED Triage Notes (Signed)
Substernal Chest pain with radiation to back with movement. SHOB with inhalation.  2lpm [place] baseline with activity

## 2020-09-20 ENCOUNTER — Emergency Department (HOSPITAL_COMMUNITY): Payer: Medicare Other

## 2020-09-20 ENCOUNTER — Encounter (HOSPITAL_COMMUNITY): Payer: Self-pay

## 2020-09-20 DIAGNOSIS — J441 Chronic obstructive pulmonary disease with (acute) exacerbation: Secondary | ICD-10-CM | POA: Diagnosis not present

## 2020-09-20 LAB — TROPONIN I (HIGH SENSITIVITY): Troponin I (High Sensitivity): 9 ng/L (ref <18)

## 2020-09-20 MED ORDER — MORPHINE SULFATE (PF) 4 MG/ML IV SOLN
4.0000 mg | Freq: Once | INTRAVENOUS | Status: AC
  Administered 2020-09-20: 4 mg via INTRAVENOUS
  Filled 2020-09-20: qty 1

## 2020-09-20 MED ORDER — IOHEXOL 350 MG/ML SOLN
100.0000 mL | Freq: Once | INTRAVENOUS | Status: AC | PRN
  Administered 2020-09-20: 80 mL via INTRAVENOUS

## 2020-09-20 MED ORDER — KETOROLAC TROMETHAMINE 30 MG/ML IJ SOLN
30.0000 mg | Freq: Once | INTRAMUSCULAR | Status: AC
Start: 2020-09-20 — End: 2020-09-20
  Administered 2020-09-20: 30 mg via INTRAVENOUS
  Filled 2020-09-20: qty 1

## 2020-09-20 MED ORDER — PREDNISONE 10 MG PO TABS: 20.0000 mg | ORAL_TABLET | Freq: Two times a day (BID) | ORAL | 0 refills | Status: DC

## 2020-09-20 MED ORDER — SODIUM CHLORIDE (PF) 0.9 % IJ SOLN
INTRAMUSCULAR | Status: AC
  Filled 2020-09-20: qty 50

## 2020-09-20 NOTE — ED Provider Notes (Signed)
  Physical Exam  BP (!) 147/75   Pulse 69   Temp 98.4 F (36.9 C) (Oral)   Resp (!) 21   Ht 5\' 3"  (1.6 m)   Wt 62 kg   SpO2 96%   BMI 24.21 kg/m   Physical Exam Vitals and nursing note reviewed.  Constitutional:      General: She is not in acute distress.    Appearance: She is well-developed. She is not diaphoretic.  HENT:     Head: Normocephalic and atraumatic.  Cardiovascular:     Rate and Rhythm: Normal rate and regular rhythm.     Heart sounds: No murmur heard.   No friction rub. No gallop.  Pulmonary:     Effort: Pulmonary effort is normal. No respiratory distress.     Breath sounds: Examination of the right-middle field reveals rhonchi. Examination of the left-middle field reveals rhonchi. Rhonchi present. No wheezing.     Comments: There is tenderness to palpation of the anterior chest wall.  This reproduces her symptoms. Abdominal:     General: Bowel sounds are normal. There is no distension.     Palpations: Abdomen is soft.     Tenderness: There is no abdominal tenderness.  Musculoskeletal:        General: Normal range of motion.     Cervical back: Normal range of motion and neck supple.  Skin:    General: Skin is warm and dry.  Neurological:     General: No focal deficit present.     Mental Status: She is alert and oriented to person, place, and time.    ED Course/Procedures     Procedures  MDM   Care assumed from Dr. Kathrynn Humble at shift change.  Patient awaiting results of a D-dimer.  The study has returned and is negative.  Patient was reassessed and continues to have ongoing, significant discomfort.  It is reproducible with palpation of the anterior chest wall, however due to the degree of her discomfort, a CTA was ordered of the chest.  This showed no evidence for pulmonary embolism or other acute abnormality.  This appears to be a COPD exacerbation with likely costochondritis.  Patient will be discharged with prednisone, pain medication, continued use of  her albuterol, and return as needed.       Veryl Speak, MD 09/20/20 [phone]

## 2020-09-20 NOTE — Discharge Instructions (Addendum)
Begin taking prednisone as prescribed.  Continue your albuterol as previously recommended.  Continue taking hydrocodone as prescribed as needed for pain.  Follow-up with your primary doctor if symptoms or not improving in the next few days, and return to the ER if symptoms significantly worsen or change.

## 2020-09-21 ENCOUNTER — Other Ambulatory Visit: Payer: Self-pay | Admitting: Adult Health

## 2020-09-22 ENCOUNTER — Encounter (HOSPITAL_COMMUNITY): Payer: Self-pay | Admitting: *Deleted

## 2020-09-22 ENCOUNTER — Emergency Department (HOSPITAL_COMMUNITY)
Admission: EM | Admit: 2020-09-22 | Discharge: 2020-09-22 | Disposition: A | Discharge status: Home / Self Care | Payer: Medicare Other | Attending: Emergency Medicine | Admitting: Emergency Medicine

## 2020-09-22 ENCOUNTER — Emergency Department (HOSPITAL_COMMUNITY): Payer: Medicare Other

## 2020-09-22 DIAGNOSIS — R062 Wheezing: Secondary | ICD-10-CM | POA: Insufficient documentation

## 2020-09-22 DIAGNOSIS — I110 Hypertensive heart disease with heart failure: Secondary | ICD-10-CM | POA: Insufficient documentation

## 2020-09-22 DIAGNOSIS — U071 COVID-19: Secondary | ICD-10-CM | POA: Insufficient documentation

## 2020-09-22 DIAGNOSIS — J441 Chronic obstructive pulmonary disease with (acute) exacerbation: Secondary | ICD-10-CM | POA: Diagnosis not present

## 2020-09-22 DIAGNOSIS — Z87891 Personal history of nicotine dependence: Secondary | ICD-10-CM | POA: Insufficient documentation

## 2020-09-22 DIAGNOSIS — Z7982 Long term (current) use of aspirin: Secondary | ICD-10-CM | POA: Insufficient documentation

## 2020-09-22 DIAGNOSIS — I5032 Chronic diastolic (congestive) heart failure: Secondary | ICD-10-CM | POA: Diagnosis not present

## 2020-09-22 DIAGNOSIS — Z79899 Other long term (current) drug therapy: Secondary | ICD-10-CM | POA: Diagnosis not present

## 2020-09-22 DIAGNOSIS — R0602 Shortness of breath: Secondary | ICD-10-CM | POA: Diagnosis present

## 2020-09-22 DIAGNOSIS — Z85038 Personal history of other malignant neoplasm of large intestine: Secondary | ICD-10-CM | POA: Diagnosis not present

## 2020-09-22 LAB — BASIC METABOLIC PANEL
Anion gap: 6 (ref 5–15)
BUN: 18 mg/dL (ref 8–23)
CO2: 30 mmol/L (ref 22–32)
Calcium: 9.3 mg/dL (ref 8.9–10.3)
Chloride: 106 mmol/L (ref 98–111)
Creatinine, Ser: 0.81 mg/dL (ref 0.44–1.00)
GFR, Estimated: >60 mL/min (ref >60)
Glucose, Bld: 100 mg/dL — ABNORMAL HIGH (ref 70–99)
Potassium: 4.3 mmol/L (ref 3.5–5.1)
Sodium: 142 mmol/L (ref 135–145)

## 2020-09-22 LAB — CBC
HCT: 44.7 % (ref 36.0–46.0)
Hemoglobin: 13.8 g/dL (ref 12.0–15.0)
MCH: 30.4 pg (ref 26.0–34.0)
MCHC: 30.9 g/dL (ref 30.0–36.0)
MCV: 98.5 fL (ref 80.0–100.0)
Platelets: 280 10*3/uL (ref 150–400)
RBC: 4.54 MIL/uL (ref 3.87–5.11)
RDW: 15.5 % (ref 11.5–15.5)
WBC: 11.3 10*3/uL — ABNORMAL HIGH (ref 4.0–10.5)
nRBC: 0 % (ref 0.0–0.2)

## 2020-09-22 LAB — RESP PANEL BY RT-PCR (FLU A&B, COVID) ARPGX2
Influenza A by PCR: NEGATIVE
Influenza B by PCR: NEGATIVE
SARS Coronavirus 2 by RT PCR: POSITIVE — AB

## 2020-09-22 LAB — BRAIN NATRIURETIC PEPTIDE: B Natriuretic Peptide: 158.9 pg/mL — ABNORMAL HIGH (ref 0.0–100.0)

## 2020-09-22 LAB — TROPONIN I (HIGH SENSITIVITY)
Troponin I (High Sensitivity): 11 ng/L (ref <18)
Troponin I (High Sensitivity): 19 ng/L — ABNORMAL HIGH (ref <18)

## 2020-09-22 MED ORDER — ONDANSETRON HCL 4 MG/2ML IJ SOLN
4.0000 mg | Freq: Once | INTRAMUSCULAR | Status: AC
  Administered 2020-09-22: 4 mg via INTRAVENOUS
  Filled 2020-09-22: qty 2

## 2020-09-22 MED ORDER — MORPHINE SULFATE (PF) 4 MG/ML IV SOLN
4.0000 mg | Freq: Once | INTRAVENOUS | Status: AC
  Administered 2020-09-22: 4 mg via INTRAVENOUS
  Filled 2020-09-22: qty 1

## 2020-09-22 MED ORDER — IPRATROPIUM-ALBUTEROL 0.5-2.5 (3) MG/3ML IN SOLN
3.0000 mL | Freq: Once | RESPIRATORY_TRACT | Status: AC
  Administered 2020-09-22: 3 mL via RESPIRATORY_TRACT
  Filled 2020-09-22: qty 3

## 2020-09-22 NOTE — ED Triage Notes (Signed)
Pt complains of chest pain, SOB x 4 days. She was seen in ER for COPD exacerbation ath time.

## 2020-09-22 NOTE — ED Provider Notes (Signed)
[facility] DEPT Provider Note   CSN: [id number] Arrival date & time: 09/22/20  1227  Patient with history of COPD is presenting for shortness of breath and chest pain x4 days.  Pain feels like a stabbing pain in the midsternum.  It is worse when she moves or leans forward.  She also endorses feeling increased shortness of breath as she moves.  She is usually on 1 to 2 L of oxygen at home, but recently she has been having to be on 3 L.  She does voice some concern that this could be a component of spinal compression.  However, she is not having any back pain.  She does endorse increased wheezing and coughing, but no fever or leg swelling.  She took her rescue albuterol inhaler at 9:30 AM this morning.  States that did not help with her breathing.   States that she was seen in the ED at [facility] on 6/15.  Pain has not improved since that time.   History Chief Complaint  Patient presents with   Shortness of Breath   Chest Pain    KIMYATA MILICH is a 65 y.o. female.  HPI     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   OSTEOARTHRITIS 06/05/2009   PARESTHESIA 08/24/2007   TOBACCO ABUSE 01/25/2010    Patient Active Problem List   Diagnosis Date Noted   Osteoporosis 06/05/2020   COPD exacerbation ([place]) 03/24/2020   Chronic diastolic CHF (congestive heart failure) ([place]) 03/24/2020   Essential hypertension 03/24/2020   Vertebral fracture, osteoporotic ([place]) 03/05/2020   COPD with acute exacerbation ([place]) 07/10/2019   History of colonic polyps    Benign neoplasm of ascending colon    Neuritis 01/29/2017   Alpha-1-antitrypsin deficiency carrier 01/19/2017   Dependent edema 01/14/2017   Allergic rhinitis 08/10/2015   Chronic respiratory failure ([place]) 07/05/2015   Hyperglycemia    HLD (hyperlipidemia)     Coronary artery calcification seen on CAT scan 07/02/2015   Solitary pulmonary nodule 12/02/2013   Loss of weight 11/10/2013   Osteoarthritis 06/05/2009   Brachial neuritis or radiculitis 06/05/2009   Dyslipidemia 03/12/2009   NEPHROLITHIASIS 01/05/2008   COPD GOLD III  08/31/2007   PARESTHESIA 08/24/2007   Anxiety and depression 11/19/2006    Past Surgical History:  Procedure Laterality Date   ABDOMINAL HYSTERECTOMY     APPENDECTOMY  1969   CARDIAC CATHETERIZATION N/A 07/05/2015   Procedure: Left Heart Cath and Coronary Angiography;  Surgeon: Leonie Man, MD;  Location: [facility];  Service: Cardiovascular;  Laterality: N/A;   CHOLECYSTECTOMY  1989   collapse lung  1990   COLONOSCOPY WITH PROPOFOL N/A 05/14/2018   Procedure: COLONOSCOPY WITH PROPOFOL;  Surgeon: Irene Shipper, MD;  Location: [facility];  Service: Endoscopy;  Laterality: N/A;   EXCISION MASS ABDOMINAL  2019   EXPLORATORY LAPAROTOMY  1969   POLYPECTOMY  05/14/2018   Procedure: POLYPECTOMY;  Surgeon: Irene Shipper, MD;  Location: [facility];  Service: Endoscopy;;   SALPINGOOPHORECTOMY  2019     OB History   No obstetric history on file.     Family History  Problem Relation Age of Onset   Cancer Mother        lung   Heart attack Mother    Hypertension Sister  Multiple sclerosis Brother    Diabetes Sister    Rheum arthritis Sister    Thyroid disease Sister    Multiple sclerosis Other    Alcohol abuse Other    Arthritis Other    Diabetes Other    Kidney disease Other    Cancer Other        lung,ovarian,skin, uterine   Stroke Other    Heart disease Other    Melanoma Other    Osteoporosis Other    Heart attack Maternal Uncle    Heart attack Other        NEICE   Heart attack Other        NEPHEW   Hypertension Brother    Stroke Maternal Grandmother    Colon cancer Neg Hx     Social History   Tobacco Use   Smoking status: Former    Packs/day: 2.00    Years: 38.00    Pack years:  76.00    Types: Cigarettes    Quit date: 03/07/2014    Years since quitting: 6.5   Smokeless tobacco: Never  Vaping Use   Vaping Use: Never used  Substance Use Topics   Alcohol use: No    Alcohol/week: 0.0 standard drinks   Drug use: No    Home Medications Prior to Admission medications   Medication Sig Start Date End Date Taking? Authorizing Provider  albuterol (VENTOLIN HFA) 108 (90 Base) MCG/ACT inhaler Take two puffs every 4-6 hours as needed for shortness of breath/wheezing . 90 day supply Patient taking differently: Inhale 2 puffs into the lungs See admin instructions. Take two puffs every 4-6 hours as needed for shortness of breath/wheezing . 90 day supply 12/14/19   Martyn Ehrich, NP  ALPRAZolam Duanne Moron) 0.5 MG tablet TAKE 1/2 TO 1 TABLET BY MOUTH THREE TIMES DAILY 07/13/20   Nafziger, Tommi Rumps, NP  Ascorbic Acid (VITAMIN C PO) Take 1 tablet by mouth daily.    [provider]  aspirin EC 81 MG EC tablet Take 1 tablet (81 mg total) by mouth daily. 07/08/15   Nita Sells, MD  Budeson-Glycopyrrol-Formoterol (BREZTRI AEROSPHERE) 160-9-4.8 MCG/ACT AERO Inhale 2 puffs into the lungs in the morning and at bedtime. 08/10/20   [name], MD  Calcium Carbonate-Vit D-Min (CALCIUM 1200 PO) Take 1 tablet by mouth daily.    [provider]  Cholecalciferol (VITAMIN D3) 50 MCG (2000 UT) TABS Take 2,000 Units by mouth daily.    [provider]  cyanocobalamin 2000 MCG tablet Take 2,000 mcg by mouth daily. Vitamin B12    [provider]  furosemide (LASIX) 40 MG tablet Take 1 tablet (40 mg total) by mouth daily. 05/22/20   [name], MD  Ginkgo Biloba Extract 60 MG CAPS Take 60 mg by mouth daily.    [provider]  HYDROcodone-acetaminophen (NORCO) 7.5-325 MG tablet Take 1 tablet by mouth 2 (two) times daily as needed (pain/cough). 07/17/20   Nafziger, Tommi Rumps, NP  HYDROcodone-acetaminophen (NORCO) 7.5-325 MG tablet Take 1 tablet by mouth  every 6 (six) hours as needed for moderate pain. 07/17/20   Nafziger, Tommi Rumps, NP  HYDROcodone-acetaminophen (NORCO) 7.5-325 MG tablet Take 1 tablet by mouth every 6 (six) hours as needed for moderate pain. 07/17/20   Nafziger, Tommi Rumps, NP  ipratropium-albuterol (DUONEB) 0.5-2.5 (3) MG/3ML SOLN USE 3 ML VIA NEBULIZER EVERY 6 HOURS AS NEEDED.  DX: J44.9 04/04/20   [name], MD  losartan (COZAAR) 25 MG tablet TAKE 1 TABLET EVERY  DAY 01/11/20   [name], MD  magnesium oxide (MAG-OX) 400 MG tablet Take 400 mg by mouth daily.    [provider]  metoprolol tartrate (LOPRESSOR) 25 MG tablet TAKE 1/2 TABLET(12.5 MG) BY MOUTH TWICE DAILY 09/21/20   Nafziger, Tommi Rumps, NP  nitroGLYCERIN (NITROSTAT) 0.4 MG SL tablet Place 1 tablet (0.4 mg total) under the tongue every 5 (five) minutes as needed for chest pain. 08/09/18   Lyda Jester M, PA-C  nystatin (MYCOSTATIN) 100000 UNIT/ML suspension Take 5 mLs (500,000 Units total) by mouth 4 (four) times daily. 09/18/20   Burchette, Alinda Sierras, MD  OXYGEN Inhale 2 L into the lungs continuous.    [provider]  PARoxetine (PAXIL) 30 MG tablet TAKE 1 TABLET EVERY DAY 06/06/20   Nafziger, Tommi Rumps, NP  predniSONE (DELTASONE) 10 MG tablet Take 2 tablets (20 mg total) by mouth 2 (two) times daily with a meal. 09/20/20   Veryl Speak, MD  Spacer/Aero Chamber Mouthpiece MISC 1 Device by Does not apply route as directed. 01/12/17   Javier Glazier, MD    Allergies    Ciprofloxacin, Lisinopril, and Penicillins  Review of Systems   Review of Systems  Constitutional:  Negative for chills, fatigue and fever.  HENT:  Negative for ear pain and sore throat.   Eyes:  Negative for pain and visual disturbance.  Respiratory:  Positive for cough and shortness of breath.   Cardiovascular:  Positive for chest pain. Negative for palpitations and leg swelling.  Gastrointestinal:  Negative for abdominal pain and vomiting.  Genitourinary:  Negative for dysuria  and hematuria.  Musculoskeletal:  Negative for arthralgias and back pain.  Skin:  Negative for color change and rash.  Neurological:  Negative for seizures and syncope.  All other systems reviewed and are negative.  Physical Exam Updated Vital Signs BP 111/66 (BP Location: Right Arm)   Pulse 61   Temp 97.7 F (36.5 C) (Oral)   Resp 20   Ht 5\' 3"  (1.6 m)   Wt 61.2 kg   SpO2 98%   BMI 23.91 kg/m   Physical Exam Vitals and nursing note reviewed. Exam conducted with a chaperone present.  Constitutional:      Appearance: Normal appearance. She is well-developed. She is ill-appearing.     Comments: Chronically ill-appearing. on 3 L of oxygen.  HENT:     Head: Normocephalic and atraumatic.  Eyes:     General: No scleral icterus.       Right eye: No discharge.        Left eye: No discharge.     Extraocular Movements: Extraocular movements intact.     Pupils: Pupils are equal, round, and reactive to light.  Cardiovascular:     Rate and Rhythm: Normal rate and regular rhythm.     Pulses: Normal pulses.     Heart sounds: Normal heart sounds. No murmur heard.   No friction rub. No gallop.     Comments: Radial pulses DP and PT pulses are 2+ bilaterally. Pulmonary:     Effort: Pulmonary effort is normal. No tachypnea, bradypnea, accessory muscle usage or respiratory distress.     Breath sounds: Examination of the right-upper field reveals wheezing. Examination of the left-upper field reveals wheezing. Examination of the right-middle field reveals wheezing. Examination of the left-middle field reveals wheezing and rhonchi. Examination of the right-lower field reveals decreased breath sounds and wheezing. Examination of the left-lower field reveals decreased breath sounds and wheezing. Decreased breath sounds, wheezing  and rhonchi present.     Comments: Patient is speaking in complete sentences.  On 3 L of oxygen, she is satting at 98%.  The bases are somewhat decreased breath sounds.  She  has some rhonchi as well as diffuse wheezes. Abdominal:     General: Abdomen is flat. Bowel sounds are normal. There is no distension.     Palpations: Abdomen is soft.     Tenderness: There is no abdominal tenderness.  Musculoskeletal:     Comments: No edema  Skin:    General: Skin is warm and dry.     Coloration: Skin is not jaundiced.  Neurological:     Mental Status: She is alert. Mental status is at baseline.     Coordination: Coordination normal.    ED Results / Procedures / Treatments   Labs (all labs ordered are listed, but only abnormal results are displayed) Labs Reviewed  BASIC METABOLIC PANEL - Abnormal; Notable for the following components:      Result Value   Glucose, Bld 100 (*)    All other components within normal limits  CBC - Abnormal; Notable for the following components:   WBC 11.3 (*)    All other components within normal limits  BRAIN NATRIURETIC PEPTIDE - Abnormal; Notable for the following components:   B Natriuretic Peptide 158.9 (*)    All other components within normal limits  RESP PANEL BY RT-PCR (FLU A&B, COVID) ARPGX2  TROPONIN I (HIGH SENSITIVITY)  TROPONIN I (HIGH SENSITIVITY)    EKG None  Radiology DG Chest Port 1 View  Result Date: 09/22/2020 CLINICAL DATA:  Chest pain and shortness of breath for 4 days. EXAM: PORTABLE CHEST 1 VIEW COMPARISON:  Chest radiograph September 19, 2020. FINDINGS: Monitoring leads overlie the patient. Stable cardiac and mediastinal contours. No consolidative pulmonary opacities. No pleural effusion or pneumothorax. If is images change. IMPRESSION: No active disease. Electronically Signed   By: Lovey Newcomer M.D.   On: 09/22/2020 13:15    Procedures Procedures   Medications Ordered in ED Medications  ipratropium-albuterol (DUONEB) 0.5-2.5 (3) MG/3ML nebulizer solution 3 mL (has no administration in time range)    ED Course  I have reviewed the triage vital signs and the nursing notes.  Pertinent labs & imaging  results that were available during my care of the patient were reviewed by me and considered in my medical decision making (see chart for details).  Clinical Course as of 09/22/20 1744  Sat Sep 22, 2020  1639 Troponin I (High Sensitivity)(!): 19 Delta 8. This is increased from 6/15. Not dramatically so, and her heart score is 3. Her EKG is unchanged and the presentation is somewhat atypical. Spoke with my attending and I will get cardiology input.  [HS]  1640 WBC(!): 11.3 Improved from 3 days ago.  [HS]    Clinical Course User Index [HS] Sherrill Raring, [place]   MDM Rules/Calculators/A&P                          Patient is a 65 year old female on home oxygen at home for COPD presenting for chest pain or shortness of breath.  Vitals are stable, she is satting at 98% on 3 L of oxygen here in the ED.  Lungs are notable for wheezing and some rhonchi.  She is mostly here for chest pain which is reproducible on exam.  Will work-up for chest pain.  Additional history: Patient was seen on 6/15 for the  exact same complaint.  States that symptoms have not changed since that visit, but they have not improved.  Work-up was notable for COPD exacerbation, but she did not elevated troponin or a positive D-dimer at that time.  Spoke with Dr. Percival Spanish with cardiology, and he believes the patient is low risk and appropriate for discharge with follow up. Elevated troponin in the absence of other concerning Factors does not warrant admission.  Given that her EKG is unchanged, heart score is 3, she is appropriate for dispo.  Patient tested positive for COVID while in the ED.  This is unfortunate given she just had COVID about a month ago.  She did test negative in between these 2 positive test results.  The plan is still the same, she will take her prednisone as prescribed on Wednesday.  We have close follow-up with her primary care physician and her cardiologist.  I given the patient return precautions.  She is in  agreement with the plan.  Also discussed quarantining and isolation per CDC guidelines.  Discussed HPI, physical exam and plan of care for this patient with attending [facility]. The attending physician agrees with plan of care.   Final Clinical Impression(s) / ED Diagnoses Final diagnoses:  None    Rx / DC Orders ED Discharge Orders     None        Sherrill Raring, Hershal Coria 09/22/20 1800    Luna Fuse, MD 09/23/20 [phone]

## 2020-09-22 NOTE — ED Provider Notes (Signed)
Emergency Medicine Provider Triage Evaluation Note  Kim [name] , a 65 y.o. female  was evaluated in triage.  Pt complains of sharp left chest pain/epigastric, took 324mg  ASA at home, seen for same Wednesday in the ER, pain not improving. Pain worse with movement and palpation. Onset Tuesday, felt like a "compression issue" went to ortho who ruled out ortho cause.  On 3L [place] at baseline, SHOB, normally on [facility]. Increased WOB, wheezing, coughing, no fever, no leg swelling. Using nebulizer, last used around 9:30AM.   Review of Systems  Positive: Chest pain, cough, wheezing Negative: Fever, leg swelling  Physical Exam  There were no vitals taken for this visit. Gen:   Awake,  Resp:  Increased WOB, speaks in short sentences  MSK:   Moves extremities without difficulty  Other:  Lungs dimished in the bases, no wheezing  Medical Decision Making  Medically screening exam initiated at 12:40 PM.  Appropriate orders placed.  Kim [name] was informed that the remainder of the evaluation will be completed by another provider, this initial triage assessment does not replace that evaluation, and the importance of remaining in the ED until their evaluation is complete.     Kim Learn, PA-C 09/22/20 1246    Kim Ferguson, MD 09/24/20 [phone]

## 2020-09-22 NOTE — Discharge Instructions (Addendum)
You were seen in the emergency department for shortness of breath and chest pain.  Did not find any signs of life-threatening disease during your work-up in the ED today.    You did test positive for COVID-19.  Please quarantine for the next 5 days.  After 5 days continue to wear a mask when you are around other people.  Continue to use your oxygen as needed, but there is a chance he may need to increase higher than your normal dose.  If your oxygen sats drop below 88 please return back to the emergency department for further evaluation.  Please make an appointment to follow-up with your primary care doctor in the next week or 2 in case symptoms fail to improve.  Also he discussed an appoint with your cardiologist for follow-up.  Continue taking the prednisone you are prescribed on Wednesday as instructed.    If conditions worsen or fail to improve prove please return back to the emergency department for further evaluation.

## 2020-09-24 ENCOUNTER — Telehealth: Payer: Self-pay | Admitting: Primary Care

## 2020-09-24 NOTE — Telephone Encounter (Signed)
Pt stated that she is wanting to be seen by Nurse Volanda Napoleon stated that she went to ER on last Wednesday (09/19/2020) and Saturday (09/22/2020). Pt stated that she went to the ER due to chest pains and very SOB she said it felt like someone was stabbing her in her chest. Stated that on Saturday (09/22/2020) they did do a Covid-19 test on her and stated she was positive although she had Covid last month on May 12th and she said when she got home she did an at home Covid-19 test but she tested negative (09/22/2020) she said the ER doctor did prescribe her prednisone 40 mg for 5 days but she said she had not been on prednisone before without a taper. She also stated that she does not have a fever. Pls regard; [phone]

## 2020-09-24 NOTE — Telephone Encounter (Signed)
Left message for patient to call back  

## 2020-09-26 NOTE — Telephone Encounter (Signed)
Pt returning a phone call. Pt can be reached at [phone].

## 2020-09-26 NOTE — Telephone Encounter (Signed)
Called and spoke to pt. Pt states she went to ED on 6/15 and 6/18. Pt was diagnosed with COVID on 6/18. Pts s/s started on 6/15. Pt c/o sharp substernal chest pain, prod cough with tan mucus, increase in SHOB, wheezing. Pt states the only thing that helps with the chest pain is therworx which has mag sulfateas active ingredient. Pt states she was also given muscle relaxer but it didn't help. Pt states her s/s have not improved nor worsened since ED visit. Pt was given 40mg  daily pred from ED.    Dr. Elsworth Soho, please advise. Thanks.

## 2020-09-27 ENCOUNTER — Telehealth: Payer: Self-pay

## 2020-09-27 MED ORDER — DOXYCYCLINE HYCLATE 100 MG PO TABS: 100.0000 mg | ORAL_TABLET | Freq: Two times a day (BID) | ORAL | 0 refills | Status: DC

## 2020-09-27 NOTE — Telephone Encounter (Signed)
Spoke to pt and she can barely catch her breath to tell me what is going on. Pt stated that she is having SOB with some ear issues. PT stated she tested positive for Covid in May but she is still having issues and she know that it is not from Covid. After reviewing chart PT POC Covid test came back positive 09/22/2020. I advised pt that Covid test was positive and that may be why she is having issues breathing. PT stated she is on O2 and the level is at 2 1/2. Pt stated her normal O2 level is 1 or 1 1/2. Pt stated she cant walk. I advised pt that she needs to be seen at [facility] or ED so they can better asses her. Pt verbalized understanding.

## 2020-09-27 NOTE — Telephone Encounter (Signed)
Called and spoke to pt. Informed her of the recs per Dr. Elsworth Soho. Doxy rx sent to preferred pharmacy. Pt verbalized understanding and denied any further questions or concerns at this time.

## 2020-10-04 ENCOUNTER — Other Ambulatory Visit: Payer: Self-pay | Admitting: Cardiovascular Disease

## 2020-10-09 ENCOUNTER — Telehealth: Payer: Self-pay | Admitting: Cardiovascular Disease

## 2020-10-09 NOTE — Telephone Encounter (Signed)
Pt c/o BP issue: STAT if pt c/o blurred vision, one-sided weakness or slurred speech  1. What are your last 5 BP readings?  177/101 HR: 100;  165/91 HR: 106;  220/130 HR: 125;  138/81 HR: 104;  200/105 HR:116  2. Are you having any other symptoms (ex. Dizziness, headache, blurred vision, passed out)? Lightheadedness and headache  3. What is your BP issue? High

## 2020-10-09 NOTE — Telephone Encounter (Signed)
Spoke w patient.  All the readings listed by operator are from today.  The pt has been experiencing lightheadedness and a headache for several weeks but just decided to check BP this weekend.  Not room spinning dizzy.  Friday she experienced double vision along w the headache.    Yesterday am called EMS for elevated BP.  It had lowered to 145/ when they arrived.  In the evenings it can go as low as 103-69.   Her cuff was checked by EMS and was similar to their reading.  I asked her to take one extra losartan and a tylenol at this time, lie down and check BP in 1.5 -2 hours and call back to the office if BP is >180 SBP or >100 DBP.  Pt in agreement w this plan.  Will route to Dr. Angelena Form and PharmD for further recommendations. Scheduled her with MD in November for follow up.

## 2020-10-10 MED ORDER — LOSARTAN POTASSIUM 50 MG PO TABS: 50.0000 mg | ORAL_TABLET | Freq: Every day | ORAL | 3 refills | Status: DC

## 2020-10-10 NOTE — Telephone Encounter (Signed)
I agree with plan to increase losartan to 50mg  daily. Would also increase metoprolol to 25mg  BID since she is also tachycardic. Follow up in HTN clinic.

## 2020-10-10 NOTE — Telephone Encounter (Signed)
Pt reports she is not taking metoprolol.  States it was stopped when she was put on losartan.  She has significant COPD and her HR is persistently elevated. Pt will continue losartan 50 mg daily and I have scheduled her w the HTN clinic PharmD.  6 am 169/97 - took losartan at that time 172/91 189/93 135/79 2pm: 178/105 3:00 pm 112/68

## 2020-10-15 ENCOUNTER — Other Ambulatory Visit: Payer: Self-pay | Admitting: Adult Health

## 2020-10-15 DIAGNOSIS — F32A Depression, unspecified: Secondary | ICD-10-CM

## 2020-10-15 DIAGNOSIS — F419 Anxiety disorder, unspecified: Secondary | ICD-10-CM

## 2020-10-16 NOTE — Telephone Encounter (Signed)
Okay for refill?    LOV04/03/2021   Last Refill   07/17/2020    60 QTY.    2 Refills  Pt is taking 1/2-1 tab TID

## 2020-10-17 ENCOUNTER — Other Ambulatory Visit: Payer: Self-pay

## 2020-10-18 ENCOUNTER — Ambulatory Visit (INDEPENDENT_AMBULATORY_CARE_PROVIDER_SITE_OTHER): Payer: Medicare Other | Admitting: Adult Health

## 2020-10-18 ENCOUNTER — Encounter: Payer: Self-pay | Admitting: Adult Health

## 2020-10-18 VITALS — BP 110/78 | HR 93 | Temp 97.7°F | Ht 63.0 in | Wt 130.0 lb

## 2020-10-18 DIAGNOSIS — G8929 Other chronic pain: Secondary | ICD-10-CM | POA: Diagnosis not present

## 2020-10-18 DIAGNOSIS — I2510 Atherosclerotic heart disease of native coronary artery without angina pectoris: Secondary | ICD-10-CM

## 2020-10-18 DIAGNOSIS — E785 Hyperlipidemia, unspecified: Secondary | ICD-10-CM | POA: Diagnosis not present

## 2020-10-18 DIAGNOSIS — M159 Polyosteoarthritis, unspecified: Secondary | ICD-10-CM

## 2020-10-18 DIAGNOSIS — M8949 Other hypertrophic osteoarthropathy, multiple sites: Secondary | ICD-10-CM | POA: Diagnosis not present

## 2020-10-18 LAB — LIPID PANEL
Cholesterol: 206 mg/dL — ABNORMAL HIGH (ref 0–200)
HDL: 50.6 mg/dL (ref >39.00)
LDL Cholesterol: 117 mg/dL — ABNORMAL HIGH (ref 0–99)
NonHDL: 155.7
Total CHOL/HDL Ratio: 4
Triglycerides: 195 mg/dL — ABNORMAL HIGH (ref 0.0–149.0)
VLDL: 39 mg/dL (ref 0.0–40.0)

## 2020-10-18 NOTE — Patient Instructions (Signed)
It was great seeing you today   I will see you back in 3 months   We will follow up with you regarding your blood work

## 2020-10-18 NOTE — Progress Notes (Signed)
Subjective:    Patient ID: Kim [name], female    DOB: November 08, 1955, 65 y.o.   MRN: [id number]  HPI  65 year old female who  has a past medical history of C8 RADICULOPATHY (06/05/2009), COPD (08/31/2007), DEPRESSION (11/19/2006), DYSLIPIDEMIA (03/12/2009), MI (mitral incompetence), NEPHROLITHIASIS (01/05/2008), OSTEOARTHRITIS (06/05/2009), PARESTHESIA (08/24/2007), and TOBACCO ABUSE (01/25/2010).  She presents to the office today for pain management visit and also to recheck her cholesterol level.  NCCSRS reviewed in EPIC   Indication for chronic opioid: Osteoarthritis involving multiple joints Medication and dose: Norco 7.5 - 325 mg, every 6 hours PRN  # pills per month: 45 Last UDS date: 07/13/2020 Opioid Treatment Agreement signed: YES Opioid Treatment Agreement last reviewed with patient: Today  [place] reviewed this encounter (include red flags):  Reviewed and no red flags noted.   During her CPE in April 2022 her cholesterol panel came back high. She wanted to work on lifestyle modifications prior to starting statin. She has been eating healthier.  Lab Results  Component Value Date   CHOL 254 (H) 07/13/2020   HDL 59.10 07/13/2020   LDLCALC 156 (H) 07/13/2020   LDLDIRECT 155.4 12/29/2012   TRIG 195.0 (H) 07/13/2020   CHOLHDL 4 07/13/2020      Review of Systems  Constitutional: Negative.   Respiratory:  Positive for shortness of breath (chronic).   Cardiovascular: Negative.   Musculoskeletal:  Positive for arthralgias and back pain.  Neurological: Negative.   Psychiatric/Behavioral: Negative.    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   OSTEOARTHRITIS 06/05/2009   PARESTHESIA 08/24/2007   TOBACCO ABUSE 01/25/2010    Social History   Socioeconomic History   Marital status: Married    Spouse name: [name]   Number of  children: 2   Years of education: Not on file   Highest education level: Not on file  Occupational History   Occupation: Patient care aide-sits with elderly lady  Tobacco Use   Smoking status: Former    Packs/day: 2.00    Years: 38.00    Pack years: 76.00    Types: Cigarettes    Quit date: 03/07/2014    Years since quitting: 6.6   Smokeless tobacco: Never  Vaping Use   Vaping Use: Never used  Substance and Sexual Activity   Alcohol use: No    Alcohol/week: 0.0 standard drinks   Drug use: No   Sexual activity: Not on file  Other Topics Concern   Not on file  Social History Narrative   [facility] (01/12/17):   Originally from [facility]. Has lived in [place] for 17 years. Married. Has 2 dogs currently. No mold and only 1 indoor plant. Had her home professionally cleaned a month ago. No bird or hot tub exposure. 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    Social Determinants of Health   Financial Resource Strain: Low Risk    Difficulty of Paying Living Expenses: Not hard at all  Food Insecurity: No Food Insecurity   Worried About Charity fundraiser in the Last Year: Never true   [place] in the Last Year: Never true  Transportation Needs: No Transportation Needs   Lack of Transportation (Medical): No   Lack of Transportation (Non-Medical): No  Physical Activity: Inactive   Days of Exercise per Week:  0 days   Minutes of Exercise per Session: 0 min  Stress: No Stress Concern Present   Feeling of Stress : Not at all  Social Connections: Moderately Integrated   Frequency of Communication with Friends and Family: More than three times a week   Frequency of Social Gatherings with Friends and Family: More than three times a week   Attends Religious Services: More than 4 times per year   Active Member of Clubs or Organizations: No   Attends Archivist Meetings: Never   Marital Status: Married  Human resources officer Violence: Not At Risk   Fear  of Current or Ex-Partner: No   Emotionally Abused: No   Physically Abused: No   Sexually Abused: No    Past Surgical History:  Procedure Laterality Date   [place] N/A 07/05/2015   Procedure: Left Heart Cath and Coronary Angiography;  Surgeon: Leonie Man, MD;  Location: [facility];  Service: Cardiovascular;  Laterality: N/A;   CHOLECYSTECTOMY  1989   collapse lung  1990   COLONOSCOPY WITH PROPOFOL N/A 05/14/2018   Procedure: COLONOSCOPY WITH PROPOFOL;  Surgeon: Irene Shipper, MD;  Location: [facility];  Service: Endoscopy;  Laterality: N/A;   EXCISION MASS ABDOMINAL  2019   EXPLORATORY LAPAROTOMY  1969   POLYPECTOMY  05/14/2018   Procedure: POLYPECTOMY;  Surgeon: Irene Shipper, MD;  Location: [facility];  Service: Endoscopy;;   SALPINGOOPHORECTOMY  2019    Family History  Problem Relation Age of Onset   Cancer Mother        lung   Heart attack Mother    Hypertension Sister    Multiple sclerosis Brother    Diabetes Sister    Rheum arthritis Sister    Thyroid disease Sister    Multiple sclerosis Other    Alcohol abuse Other    Arthritis Other    Diabetes Other    Kidney disease Other    Cancer Other        lung,ovarian,skin, uterine   Stroke Other    Heart disease Other    Melanoma Other    Osteoporosis Other    Heart attack Maternal Uncle    Heart attack Other        NEICE   Heart attack Other        NEPHEW   Hypertension Brother    Stroke Maternal Grandmother    Colon cancer Neg Hx     Allergies  Allergen Reactions   Ciprofloxacin Other (See Comments)    Hallucinations    Lisinopril Cough   Penicillins Hives    Tolerates Omnicef Has patient had a PCN reaction causing immediate rash, facial/tongue/throat swelling, SOB or lightheadedness with hypotension: Yes Has patient had a PCN reaction causing severe rash involving mucus membranes or skin necrosis: No Has patient had a PCN  reaction that required hospitalization No Has patient had a PCN reaction occurring within the last 10 years: No If all of the above answers are "NO", then may proceed with Cephalosporin use.     Current Outpatient Medications on File Prior to Visit  Medication Sig Dispense Refill   albuterol (VENTOLIN HFA) 108 (90 Base) MCG/ACT inhaler Take two puffs every 4-6 hours as needed for shortness of breath/wheezing . 90 day supply (Patient taking differently: Inhale 2 puffs into the lungs See admin instructions. Take two puffs every 4-6 hours as needed for shortness of breath/wheezing .  90 day supply) 162 g 1   ALPRAZolam (XANAX) 0.5 MG tablet TAKE 1/2 TO 1 TABLET BY MOUTH THREE TIMES DAILY 60 tablet 2   aspirin EC 81 MG EC tablet Take 1 tablet (81 mg total) by mouth daily.     Budeson-Glycopyrrol-Formoterol (BREZTRI AEROSPHERE) 160-9-4.8 MCG/ACT AERO Inhale 2 puffs into the lungs in the morning and at bedtime. 32.1 g 3   furosemide (LASIX) 40 MG tablet Take 1 tablet (40 mg total) by mouth daily. 90 tablet 3   Ginkgo Biloba Extract 60 MG CAPS Take 60 mg by mouth daily.     HYDROcodone-acetaminophen (NORCO) 7.5-325 MG tablet Take 1 tablet by mouth 2 (two) times daily as needed (pain/cough). 45 tablet 0   HYDROcodone-acetaminophen (NORCO) 7.5-325 MG tablet Take 1 tablet by mouth every 6 (six) hours as needed for moderate pain. 45 tablet 0   HYDROcodone-acetaminophen (NORCO) 7.5-325 MG tablet Take 1 tablet by mouth every 6 (six) hours as needed for moderate pain. 45 tablet 0   ipratropium-albuterol (DUONEB) 0.5-2.5 (3) MG/3ML SOLN USE 3 ML VIA NEBULIZER EVERY 6 HOURS AS NEEDED.  DX: J44.9 360 mL 5   losartan (COZAAR) 50 MG tablet Take 1 tablet (50 mg total) by mouth daily. 90 tablet 3   magnesium oxide (MAG-OX) 400 MG tablet Take 400 mg by mouth daily.     nitroGLYCERIN (NITROSTAT) 0.4 MG SL tablet Place 1 tablet (0.4 mg total) under the tongue every 5 (five) minutes as needed for chest pain. 25 tablet 3    nystatin (MYCOSTATIN) 100000 UNIT/ML suspension Take 5 mLs (500,000 Units total) by mouth 4 (four) times daily. 240 mL 0   OXYGEN Inhale 2 L into the lungs continuous.     PARoxetine (PAXIL) 30 MG tablet TAKE 1 TABLET EVERY DAY 90 tablet 1   Spacer/Aero Chamber Mouthpiece MISC 1 Device by Does not apply route as directed. 1 each 0   methocarbamol (ROBAXIN) 500 MG tablet TAKE 1 TABLET BY MOUTH TWICE DAILY FOR 10 DAYS AS NEEDED     predniSONE (DELTASONE) 10 MG tablet Take 2 tablets (20 mg total) by mouth 2 (two) times daily with a meal. (Patient not taking: Reported on 10/18/2020) 20 tablet 0   Current Facility-Administered Medications on File Prior to Visit  Medication Dose Route Frequency Provider Last Rate Last Admin   0.9 %  sodium chloride infusion   Intravenous PRN Pickenpack-Cousar, Athena N, NP        BP 110/78   Pulse 93   Temp 97.7 F (36.5 C) (Oral)   Ht 5\' 3"  (1.6 m)   Wt 130 lb (59 kg)   SpO2 93%   BMI 23.03 kg/m       Objective:   Physical Exam Vitals and nursing note reviewed.  Constitutional:      Appearance: Normal appearance.  Cardiovascular:     Rate and Rhythm: Normal rate and regular rhythm.     Pulses: Normal pulses.     Heart sounds: Normal heart sounds.  Pulmonary:     Effort: Pulmonary effort is normal.     Breath sounds: Normal breath sounds.     Comments: 2L via [employer] Musculoskeletal:        General: Normal range of motion.  Skin:    General: Skin is warm and dry.  Neurological:     General: No focal deficit present.     Mental Status: She is alert and oriented to person, place, and time.  Psychiatric:  Mood and Affect: Mood normal.        Behavior: Behavior normal.        Thought Content: Thought content normal.      Assessment & Plan:  1. Encounter for chronic pain management -Controlled substance database reviewed.  No red flags. - DRUG MONITOR, PANEL 1, W/CONF, W/DL, URINE; Future - DRUG MONITOR, PANEL 1, W/CONF, W/DL,  URINE  2. Primary osteoarthritis involving multiple joints - Continue with pain management plan  3. Hyperlipidemia, unspecified hyperlipidemia type  - Lipid panel; Future - Lipid panel  [name], NP

## 2020-10-23 LAB — DRUG MONITOR, PANEL 1, W/CONF, W/DL, URINE
Alphahydroxyalprazolam: 322 ng/mL — ABNORMAL HIGH (ref <25)
Alphahydroxymidazolam: NEGATIVE ng/mL (ref <50)
Alphahydroxytriazolam: NEGATIVE ng/mL (ref <50)
Aminoclonazepam: NEGATIVE ng/mL (ref <25)
Amphetamines: NEGATIVE ng/mL (ref <500)
Barbiturates: NEGATIVE ng/mL (ref <300)
Benzodiazepines: POSITIVE ng/mL — AB (ref <100)
Cocaine Metabolite: NEGATIVE ng/mL (ref <150)
Codeine: NEGATIVE ng/mL (ref <50)
Creatinine: 169 mg/dL (ref >=20.0)
Hydrocodone: 733 ng/mL — ABNORMAL HIGH (ref <50)
Hydromorphone: 136 ng/mL — ABNORMAL HIGH (ref <50)
Hydroxyethylflurazepam: NEGATIVE ng/mL (ref <50)
Lorazepam: NEGATIVE ng/mL (ref <50)
Marijuana Metabolite: NEGATIVE ng/mL (ref <20)
Methadone Metabolite: NEGATIVE ng/mL (ref <100)
Morphine: NEGATIVE ng/mL (ref <50)
Nordiazepam: NEGATIVE ng/mL (ref <50)
Norhydrocodone: 2409 ng/mL — ABNORMAL HIGH (ref <50)
Opiates: POSITIVE ng/mL — AB (ref <100)
Oxazepam: NEGATIVE ng/mL (ref <50)
Oxidant: NEGATIVE ug/mL (ref <200)
Oxycodone: NEGATIVE ng/mL (ref <100)
Phencyclidine: NEGATIVE ng/mL (ref <25)
Temazepam: NEGATIVE ng/mL (ref <50)
pH: 6.7 (ref 4.5–9.0)

## 2020-10-23 LAB — DM TEMPLATE

## 2020-10-24 ENCOUNTER — Other Ambulatory Visit: Payer: Self-pay | Admitting: Adult Health

## 2020-10-24 ENCOUNTER — Ambulatory Visit (INDEPENDENT_AMBULATORY_CARE_PROVIDER_SITE_OTHER): Payer: Medicare Other | Admitting: Pharmacist

## 2020-10-24 ENCOUNTER — Other Ambulatory Visit: Payer: Self-pay

## 2020-10-24 VITALS — BP 138/82 | HR 90

## 2020-10-24 DIAGNOSIS — I10 Essential (primary) hypertension: Secondary | ICD-10-CM | POA: Diagnosis not present

## 2020-10-24 DIAGNOSIS — M8949 Other hypertrophic osteoarthropathy, multiple sites: Secondary | ICD-10-CM

## 2020-10-24 DIAGNOSIS — I2510 Atherosclerotic heart disease of native coronary artery without angina pectoris: Secondary | ICD-10-CM | POA: Diagnosis not present

## 2020-10-24 DIAGNOSIS — M159 Polyosteoarthritis, unspecified: Secondary | ICD-10-CM

## 2020-10-24 LAB — BASIC METABOLIC PANEL
BUN/Creatinine Ratio: 14 (ref 12–28)
BUN: 11 mg/dL (ref 8–27)
CO2: 26 mmol/L (ref 20–29)
Calcium: 9.9 mg/dL (ref 8.7–10.3)
Chloride: 101 mmol/L (ref 96–106)
Creatinine, Ser: 0.79 mg/dL (ref 0.57–1.00)
Glucose: 107 mg/dL — ABNORMAL HIGH (ref 65–99)
Potassium: 4.3 mmol/L (ref 3.5–5.2)
Sodium: 141 mmol/L (ref 134–144)
eGFR: 82 mL/min/{1.73_m2} (ref >59)

## 2020-10-24 MED ORDER — NITROGLYCERIN 0.4 MG SL SUBL: 0.4000 mg | SUBLINGUAL_TABLET | SUBLINGUAL | 3 refills | Status: AC | PRN

## 2020-10-24 MED ORDER — ROSUVASTATIN CALCIUM 20 MG PO TABS: 20.0000 mg | ORAL_TABLET | Freq: Every day | ORAL | 3 refills | Status: DC

## 2020-10-24 MED ORDER — HYDROCODONE-ACETAMINOPHEN 7.5-325 MG PO TABS: 1.0000 | ORAL_TABLET | Freq: Four times a day (QID) | ORAL | 0 refills | Status: DC | PRN

## 2020-10-24 MED ORDER — HYDROCODONE-ACETAMINOPHEN 7.5-325 MG PO TABS: 1.0000 | ORAL_TABLET | Freq: Two times a day (BID) | ORAL | 0 refills | Status: DC | PRN

## 2020-10-24 NOTE — Progress Notes (Signed)
Patient ID: Kim [name]                 DOB: Jun 12, 1955                      MRN: [id number]     HPI: Kim [name] is a 65 y.o. female referred by Dr. Angelena Form to HTN clinic. PMH is significant for CAD, CHF with LVEF 20-25% 06/2015 secondary to NICM (possibly Takotsubo cardiomyopathy) improved to 60-65% 07/2019 echo with grade 1 diastolic dysfunction, mild CAD by cath in 2017, HLD,  COPD, and tobacco abuse. Pt called clinic 7/5 reporting elevated BP up to 220/130 accompanied by headache and double vision. Called EMS, BP had lowered to 145. Reports BP in the evening goes as low as 103/69. Losartan dose was increased to 50mg  daily. Her beta blocker had been d/c'ed due to COPD.  Pt presents today in good spirits with her husband. Reports tolerating higher dose of losartan well. Only uses furosemide as needed for swelling in her hands, has taken 1 dose in the last week. Weighs every morning, weight stable around 130. Avoids NSAIDs, uses Tylenol as needed for headaches. Headaches have improved as her BP has come down with dose increase of losartan, has still had a few though. No further episodes of double vision. Reports home BP fluctuates quite a bit. Did purchase a new [facility] bicep cuff which she brings to visit today along with all of her medications. Full med rec completed, home BP cuff measuring accurately as well (141/81 using home cuff, 138/82 with manual clinic reading).   Watches salt in her diet, avoids fried food. LDL actually improved from 156 to 117 with diety changes. Used to take statin therapy, doesn't recall any adverse effects. States she stopped when her cholesterol was controlled. No exercise secondary to COPD, has had a few recent exacerbations. On 2-3L oxygen.  Current HTN meds: losartan 50mg  daily (6am), furosemide 40mg  daily - only takes prn Previously tried: lisinopril - cough; beta blocker - stopped due to COPD BP goal: <130/50mmHg  Family History: Mother with hx of lung cancer  and MI, sister with HTN, DM, RA, and thyroid disease, brother with MS.  Social History: Former smoker 2 PPD for 38 years, quit smoking in 2015. Denies alcohol and drug use.  Diet: 1 cappuccino a day, no other caffeine. Doesn't add salt to food. Avoids fried food. AM - has banana. Then doesn't eat until dinner, likes salads and grilled/broiled chicken or fish.  Exercise: None. COPD limits activity, was in the ER twice last month with exacerbation.  Home BP readings: 151/85, 174/91, 149/81, 155/89, 147/77, 126/70, 159/93, 166/92, 135/73, 150/97, 151/95, 174/91, 126/70, HR 95 - 111  Wt Readings from Last 3 Encounters:  10/18/20 130 lb (59 kg)  09/22/20 135 lb (61.2 kg)  09/19/20 136 lb 11 oz (62 kg)   BP Readings from Last 3 Encounters:  10/18/20 110/78  09/22/20 (!) 103/55  09/20/20 119/71   Pulse Readings from Last 3 Encounters:  10/18/20 93  09/22/20 (!) 58  09/20/20 76    Renal function: CrCl cannot be calculated (Patient's most recent lab result is older than the maximum 21 days allowed.).  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   OSTEOARTHRITIS 06/05/2009   PARESTHESIA 08/24/2007   TOBACCO ABUSE 01/25/2010  Current Outpatient Medications on File Prior to Visit  Medication Sig Dispense Refill   albuterol (VENTOLIN HFA) 108 (90 Base) MCG/ACT inhaler Take two puffs every 4-6 hours as needed for shortness of breath/wheezing . 90 day supply (Patient taking differently: Inhale 2 puffs into the lungs See admin instructions. Take two puffs every 4-6 hours as needed for shortness of breath/wheezing . 90 day supply) 162 g 1   ALPRAZolam (XANAX) 0.5 MG tablet TAKE 1/2 TO 1 TABLET BY MOUTH THREE TIMES DAILY 60 tablet 2   aspirin EC 81 MG EC tablet Take 1 tablet (81 mg total) by mouth daily.     Budeson-Glycopyrrol-Formoterol (BREZTRI  AEROSPHERE) 160-9-4.8 MCG/ACT AERO Inhale 2 puffs into the lungs in the morning and at bedtime. 32.1 g 3   furosemide (LASIX) 40 MG tablet Take 1 tablet (40 mg total) by mouth daily. 90 tablet 3   Ginkgo Biloba Extract 60 MG CAPS Take 60 mg by mouth daily.     ipratropium-albuterol (DUONEB) 0.5-2.5 (3) MG/3ML SOLN USE 3 ML VIA NEBULIZER EVERY 6 HOURS AS NEEDED.  DX: J44.9 360 mL 5   losartan (COZAAR) 50 MG tablet Take 1 tablet (50 mg total) by mouth daily. 90 tablet 3   magnesium oxide (MAG-OX) 400 MG tablet Take 400 mg by mouth daily.     methocarbamol (ROBAXIN) 500 MG tablet TAKE 1 TABLET BY MOUTH TWICE DAILY FOR 10 DAYS AS NEEDED     nitroGLYCERIN (NITROSTAT) 0.4 MG SL tablet Place 1 tablet (0.4 mg total) under the tongue every 5 (five) minutes as needed for chest pain. 25 tablet 3   nystatin (MYCOSTATIN) 100000 UNIT/ML suspension Take 5 mLs (500,000 Units total) by mouth 4 (four) times daily. 240 mL 0   OXYGEN Inhale 2 L into the lungs continuous.     PARoxetine (PAXIL) 30 MG tablet TAKE 1 TABLET EVERY DAY 90 tablet 1   predniSONE (DELTASONE) 10 MG tablet Take 2 tablets (20 mg total) by mouth 2 (two) times daily with a meal. (Patient not taking: Reported on 10/18/2020) 20 tablet 0   Spacer/Aero Chamber Mouthpiece MISC 1 Device by Does not apply route as directed. 1 each 0   Current Facility-Administered Medications on File Prior to Visit  Medication Dose Route Frequency Provider Last Rate Last Admin   0.9 %  sodium chloride infusion   Intravenous PRN Pickenpack-Cousar, Carlena Sax, NP        Allergies  Allergen Reactions   Ciprofloxacin Other (See Comments)    Hallucinations    Lisinopril Cough   Penicillins Hives    Tolerates Omnicef Has patient had a PCN reaction causing immediate rash, facial/tongue/throat swelling, SOB or lightheadedness with hypotension: Yes Has patient had a PCN reaction causing severe rash involving mucus membranes or skin necrosis: No Has patient had a PCN  reaction that required hospitalization No Has patient had a PCN reaction occurring within the last 10 years: No If all of the above answers are "NO", then may proceed with Cephalosporin use.      Assessment/Plan:  1. Hypertension - BP improved but most home readings remain elevated above goal <130/6mmHg. Will check BMET today on recent dose increase of losartan, and continue with further dose titration up to losartan 100mg  daily. She just received 3 month supply of the 50mg  dose in the mail and will plan to double up on this. Pt will continue to monitor BP at home, home cuff is measuring accurately. F/u in HTN clinic in 4 weeks  for BP check and BMET.  2. Hyperlipidemia - Baseline LDL did improve from 156 to 117 but remains above goal < 70 due to aortic atherosclerosis noted on chest CT. She previously tolerated statin therapy and stopped when her cholesterol numbers improved. Discussed that her cholesterol improved because she was on statin therapy, and since statin d/c her LDL has increased back above goal. Will restart rosuvastatin 20mg  daily which pt is agreeable to, this previously brought her LDL down to the 60s.  Anjalina Bergevin E. Tehani Mersman, PharmD, BCACP, [facility] [address] [place], [place] [address] Phone: [phone]; Fax: [phone] 10/24/2020 9:16 AM

## 2020-10-24 NOTE — Patient Instructions (Addendum)
Your blood pressure goal is < 130/29mmHg -Increase your losartan to 100mg  once daily  Your LDL cholesterol goal is < 70 -This improved from 156 to 117 with dietary changes -Restart rosuvastatin 20mg  once daily  Follow up in clinic for blood pressure check on Friday, August 19th at 8:30am

## 2020-10-31 ENCOUNTER — Other Ambulatory Visit: Payer: Self-pay | Admitting: Adult Health

## 2020-10-31 DIAGNOSIS — F32A Depression, unspecified: Secondary | ICD-10-CM

## 2020-10-31 DIAGNOSIS — F419 Anxiety disorder, unspecified: Secondary | ICD-10-CM

## 2020-11-08 ENCOUNTER — Telehealth: Payer: Self-pay | Admitting: Adult Health

## 2020-11-08 NOTE — Chronic Care Management (AMB) (Signed)
  Chronic Care Management   Outreach Note  11/08/2020 Name: Kim [name] MRN: [id number] DOB: 1955/08/12  Referred by: [name], NP Reason for referral : No chief complaint on file.   An unsuccessful telephone outreach was attempted today. The patient was referred to the pharmacist for assistance with care management and care coordination.   Follow Up Plan:   Tatjana Dellinger Upstream Scheduler

## 2020-11-12 ENCOUNTER — Telehealth: Payer: Self-pay | Admitting: Pulmonary Disease

## 2020-11-12 NOTE — Telephone Encounter (Signed)
Spoke with the pt  She is c/o wheezing, increased SOB, dry cough x 4 days  She states had pred on hand and started taking 40 mg daily and has been doing this since 8/6 and it's not helping  She is taking her breztri bid and neb a few times per day with little relief  She sounded SOB and wheezy on the phone  No fever, aches  Has not taken a covid test

## 2020-11-12 NOTE — Telephone Encounter (Signed)
ATC Patient.  LM to cal back.

## 2020-11-12 NOTE — Telephone Encounter (Signed)
Call returned to patient, confirmed DOB. Made aware of RA recommendations. Patient states she already has some prednisone at home and she has enough to make the '40mg'$  x5 days. I made her aware if it appear she does not to be sure to call us back. Patient already has an appt Friday with Beth. Confirmed appt time and date.   Nothing further needed at this time.

## 2020-11-13 ENCOUNTER — Telehealth: Payer: Self-pay | Admitting: Adult Health

## 2020-11-13 NOTE — Progress Notes (Signed)
  Chronic Care Management   Note  11/13/2020 Name: Kim [name] MRN: [id number] DOB: December 03, 1955  [name] is a 65 y.o. year old female who is a primary care patient of [name], NP. I reached out to [name] by phone today in response to a referral sent by [name]'s PCP, [name], NP.   [name] was given information about Chronic Care Management services today including:  CCM service includes personalized support from designated clinical staff supervised by her physician, including individualized plan of care and coordination with other care providers 24/7 contact phone numbers for assistance for urgent and routine care needs. Service will only be billed when office clinical staff spend 20 minutes or more in a month to coordinate care. Only one practitioner may furnish and bill the service in a calendar month. The patient may stop CCM services at any time (effective at the end of the month) by phone call to the office staff.   Patient agreed to services and verbal consent obtained.   Follow up plan:   Tatjana Secretary/administrator

## 2020-11-15 ENCOUNTER — Other Ambulatory Visit: Payer: Self-pay

## 2020-11-15 ENCOUNTER — Emergency Department (HOSPITAL_COMMUNITY): Payer: Medicare Other

## 2020-11-15 ENCOUNTER — Telehealth: Payer: Self-pay | Admitting: Pharmacist

## 2020-11-15 ENCOUNTER — Inpatient Hospital Stay (HOSPITAL_COMMUNITY)
Admission: EM | Admit: 2020-11-15 | Discharge: 2020-11-20 | DRG: 280 | Disposition: A | Discharge status: Home / Self Care | Payer: Medicare Other | Attending: Internal Medicine | Admitting: Internal Medicine

## 2020-11-15 DIAGNOSIS — I700 Atherosclerosis of aorta: Secondary | ICD-10-CM | POA: Diagnosis present

## 2020-11-15 DIAGNOSIS — Z8349 Family history of other endocrine, nutritional and metabolic diseases: Secondary | ICD-10-CM

## 2020-11-15 DIAGNOSIS — Z8249 Family history of ischemic heart disease and other diseases of the circulatory system: Secondary | ICD-10-CM | POA: Diagnosis not present

## 2020-11-15 DIAGNOSIS — F419 Anxiety disorder, unspecified: Secondary | ICD-10-CM | POA: Diagnosis present

## 2020-11-15 DIAGNOSIS — I110 Hypertensive heart disease with heart failure: Secondary | ICD-10-CM | POA: Diagnosis present

## 2020-11-15 DIAGNOSIS — Z823 Family history of stroke: Secondary | ICD-10-CM

## 2020-11-15 DIAGNOSIS — Z87442 Personal history of urinary calculi: Secondary | ICD-10-CM

## 2020-11-15 DIAGNOSIS — I25110 Atherosclerotic heart disease of native coronary artery with unstable angina pectoris: Secondary | ICD-10-CM | POA: Diagnosis present

## 2020-11-15 DIAGNOSIS — D72828 Other elevated white blood cell count: Secondary | ICD-10-CM | POA: Diagnosis present

## 2020-11-15 DIAGNOSIS — Z833 Family history of diabetes mellitus: Secondary | ICD-10-CM

## 2020-11-15 DIAGNOSIS — Z8261 Family history of arthritis: Secondary | ICD-10-CM

## 2020-11-15 DIAGNOSIS — G894 Chronic pain syndrome: Secondary | ICD-10-CM | POA: Diagnosis not present

## 2020-11-15 DIAGNOSIS — Z820 Family history of epilepsy and other diseases of the nervous system: Secondary | ICD-10-CM

## 2020-11-15 DIAGNOSIS — Z8616 Personal history of COVID-19: Secondary | ICD-10-CM | POA: Diagnosis not present

## 2020-11-15 DIAGNOSIS — I5043 Acute on chronic combined systolic (congestive) and diastolic (congestive) heart failure: Secondary | ICD-10-CM | POA: Diagnosis not present

## 2020-11-15 DIAGNOSIS — G8929 Other chronic pain: Secondary | ICD-10-CM | POA: Diagnosis present

## 2020-11-15 DIAGNOSIS — I10 Essential (primary) hypertension: Secondary | ICD-10-CM | POA: Diagnosis present

## 2020-11-15 DIAGNOSIS — I5181 Takotsubo syndrome: Secondary | ICD-10-CM | POA: Diagnosis present

## 2020-11-15 DIAGNOSIS — R0603 Acute respiratory distress: Secondary | ICD-10-CM | POA: Diagnosis present

## 2020-11-15 DIAGNOSIS — R778 Other specified abnormalities of plasma proteins: Secondary | ICD-10-CM | POA: Diagnosis not present

## 2020-11-15 DIAGNOSIS — I5042 Chronic combined systolic (congestive) and diastolic (congestive) heart failure: Secondary | ICD-10-CM | POA: Diagnosis present

## 2020-11-15 DIAGNOSIS — E782 Mixed hyperlipidemia: Secondary | ICD-10-CM | POA: Diagnosis not present

## 2020-11-15 DIAGNOSIS — I214 Non-ST elevation (NSTEMI) myocardial infarction: Secondary | ICD-10-CM

## 2020-11-15 DIAGNOSIS — Z808 Family history of malignant neoplasm of other organs or systems: Secondary | ICD-10-CM | POA: Diagnosis not present

## 2020-11-15 DIAGNOSIS — Z8262 Family history of osteoporosis: Secondary | ICD-10-CM | POA: Diagnosis not present

## 2020-11-15 DIAGNOSIS — I420 Dilated cardiomyopathy: Secondary | ICD-10-CM | POA: Diagnosis present

## 2020-11-15 DIAGNOSIS — F32A Depression, unspecified: Secondary | ICD-10-CM | POA: Diagnosis present

## 2020-11-15 DIAGNOSIS — Z79899 Other long term (current) drug therapy: Secondary | ICD-10-CM | POA: Diagnosis not present

## 2020-11-15 DIAGNOSIS — M159 Polyosteoarthritis, unspecified: Secondary | ICD-10-CM | POA: Diagnosis present

## 2020-11-15 DIAGNOSIS — I2510 Atherosclerotic heart disease of native coronary artery without angina pectoris: Secondary | ICD-10-CM | POA: Diagnosis not present

## 2020-11-15 DIAGNOSIS — T380X5A Adverse effect of glucocorticoids and synthetic analogues, initial encounter: Secondary | ICD-10-CM | POA: Diagnosis present

## 2020-11-15 DIAGNOSIS — I5032 Chronic diastolic (congestive) heart failure: Secondary | ICD-10-CM | POA: Diagnosis not present

## 2020-11-15 DIAGNOSIS — E785 Hyperlipidemia, unspecified: Secondary | ICD-10-CM | POA: Diagnosis present

## 2020-11-15 DIAGNOSIS — I5023 Acute on chronic systolic (congestive) heart failure: Secondary | ICD-10-CM | POA: Diagnosis present

## 2020-11-15 DIAGNOSIS — J441 Chronic obstructive pulmonary disease with (acute) exacerbation: Secondary | ICD-10-CM | POA: Diagnosis present

## 2020-11-15 DIAGNOSIS — I21A1 Myocardial infarction type 2: Secondary | ICD-10-CM | POA: Diagnosis present

## 2020-11-15 DIAGNOSIS — Z7952 Long term (current) use of systemic steroids: Secondary | ICD-10-CM

## 2020-11-15 DIAGNOSIS — Z881 Allergy status to other antibiotic agents status: Secondary | ICD-10-CM

## 2020-11-15 DIAGNOSIS — I5022 Chronic systolic (congestive) heart failure: Secondary | ICD-10-CM | POA: Diagnosis present

## 2020-11-15 DIAGNOSIS — Z20822 Contact with and (suspected) exposure to covid-19: Secondary | ICD-10-CM | POA: Diagnosis present

## 2020-11-15 DIAGNOSIS — Z87891 Personal history of nicotine dependence: Secondary | ICD-10-CM

## 2020-11-15 DIAGNOSIS — Z880 Allergy status to penicillin: Secondary | ICD-10-CM

## 2020-11-15 DIAGNOSIS — R0902 Hypoxemia: Secondary | ICD-10-CM | POA: Diagnosis present

## 2020-11-15 DIAGNOSIS — R0602 Shortness of breath: Secondary | ICD-10-CM | POA: Diagnosis present

## 2020-11-15 DIAGNOSIS — Z90710 Acquired absence of both cervix and uterus: Secondary | ICD-10-CM

## 2020-11-15 DIAGNOSIS — Z9049 Acquired absence of other specified parts of digestive tract: Secondary | ICD-10-CM

## 2020-11-15 DIAGNOSIS — Z888 Allergy status to other drugs, medicaments and biological substances status: Secondary | ICD-10-CM

## 2020-11-15 LAB — CBC WITH DIFFERENTIAL/PLATELET
Abs Immature Granulocytes: 0.11 10*3/uL — ABNORMAL HIGH (ref 0.00–0.07)
Basophils Absolute: 0.2 10*3/uL — ABNORMAL HIGH (ref 0.0–0.1)
Basophils Relative: 1 %
Eosinophils Absolute: 0.1 10*3/uL (ref 0.0–0.5)
Eosinophils Relative: 1 %
HCT: 44.9 % (ref 36.0–46.0)
Hemoglobin: 14 g/dL (ref 12.0–15.0)
Immature Granulocytes: 1 %
Lymphocytes Relative: 42 %
Lymphs Abs: 8.3 10*3/uL — ABNORMAL HIGH (ref 0.7–4.0)
MCH: 30.4 pg (ref 26.0–34.0)
MCHC: 31.2 g/dL (ref 30.0–36.0)
MCV: 97.6 fL (ref 80.0–100.0)
Monocytes Absolute: 1 10*3/uL (ref 0.1–1.0)
Monocytes Relative: 5 %
Neutro Abs: 10 10*3/uL — ABNORMAL HIGH (ref 1.7–7.7)
Neutrophils Relative %: 50 %
Platelets: 413 10*3/uL — ABNORMAL HIGH (ref 150–400)
RBC: 4.6 MIL/uL (ref 3.87–5.11)
RDW: 14.3 % (ref 11.5–15.5)
WBC: 19.6 10*3/uL — ABNORMAL HIGH (ref 4.0–10.5)
nRBC: 0 % (ref 0.0–0.2)

## 2020-11-15 LAB — COMPREHENSIVE METABOLIC PANEL
ALT: 18 U/L (ref 0–44)
AST: 21 U/L (ref 15–41)
Albumin: 4.1 g/dL (ref 3.5–5.0)
Alkaline Phosphatase: 71 U/L (ref 38–126)
Anion gap: 10 (ref 5–15)
BUN: 20 mg/dL (ref 8–23)
CO2: 28 mmol/L (ref 22–32)
Calcium: 9.4 mg/dL (ref 8.9–10.3)
Chloride: 104 mmol/L (ref 98–111)
Creatinine, Ser: 0.9 mg/dL (ref 0.44–1.00)
GFR, Estimated: >60 mL/min (ref >60)
Glucose, Bld: 162 mg/dL — ABNORMAL HIGH (ref 70–99)
Potassium: 3.6 mmol/L (ref 3.5–5.1)
Sodium: 142 mmol/L (ref 135–145)
Total Bilirubin: 0.5 mg/dL (ref 0.3–1.2)
Total Protein: 6.8 g/dL (ref 6.5–8.1)

## 2020-11-15 LAB — PATHOLOGIST SMEAR REVIEW

## 2020-11-15 LAB — RESP PANEL BY RT-PCR (FLU A&B, COVID) ARPGX2
Influenza A by PCR: NEGATIVE
Influenza B by PCR: NEGATIVE
SARS Coronavirus 2 by RT PCR: POSITIVE — AB

## 2020-11-15 LAB — BRAIN NATRIURETIC PEPTIDE: B Natriuretic Peptide: 132.3 pg/mL — ABNORMAL HIGH (ref 0.0–100.0)

## 2020-11-15 LAB — TROPONIN I (HIGH SENSITIVITY)
Troponin I (High Sensitivity): 74 ng/L — ABNORMAL HIGH (ref <18)
Troponin I (High Sensitivity): 389 ng/L (ref <18)
Troponin I (High Sensitivity): 746 ng/L (ref <18)
Troponin I (High Sensitivity): 835 ng/L (ref <18)

## 2020-11-15 MED ORDER — BISACODYL 10 MG RE SUPP: 10.0000 mg | Freq: Every day | RECTAL | Status: DC | PRN

## 2020-11-15 MED ORDER — ONDANSETRON HCL 4 MG/2ML IJ SOLN: 4.0000 mg | Freq: Four times a day (QID) | INTRAMUSCULAR | Status: DC | PRN

## 2020-11-15 MED ORDER — PREDNISONE 20 MG PO TABS
40.0000 mg | ORAL_TABLET | Freq: Every day | ORAL | Status: AC
  Administered 2020-11-16 – 2020-11-19 (×4): 40 mg via ORAL
  Filled 2020-11-15 (×4): qty 2

## 2020-11-15 MED ORDER — ALBUTEROL SULFATE HFA 108 (90 BASE) MCG/ACT IN AERS
2.0000 | INHALATION_SPRAY | RESPIRATORY_TRACT | Status: DC | PRN
  Administered 2020-11-16 – 2020-11-20 (×10): 2 via RESPIRATORY_TRACT
  Filled 2020-11-15: qty 6.7

## 2020-11-15 MED ORDER — GUAIFENESIN ER 600 MG PO TB12
1200.0000 mg | ORAL_TABLET | Freq: Two times a day (BID) | ORAL | Status: DC
  Administered 2020-11-16 – 2020-11-20 (×9): 1200 mg via ORAL
  Filled 2020-11-15 (×10): qty 2

## 2020-11-15 MED ORDER — ROSUVASTATIN CALCIUM 20 MG PO TABS
20.0000 mg | ORAL_TABLET | Freq: Every day | ORAL | Status: DC
  Administered 2020-11-15 – 2020-11-20 (×6): 20 mg via ORAL
  Filled 2020-11-15 (×6): qty 1

## 2020-11-15 MED ORDER — OXYCODONE HCL 5 MG PO TABS: 5.0000 mg | ORAL_TABLET | ORAL | Status: DC | PRN

## 2020-11-15 MED ORDER — ALBUTEROL SULFATE (2.5 MG/3ML) 0.083% IN NEBU: 2.5000 mg | INHALATION_SOLUTION | RESPIRATORY_TRACT | Status: DC | PRN

## 2020-11-15 MED ORDER — MAGNESIUM OXIDE -MG SUPPLEMENT 400 (240 MG) MG PO TABS
400.0000 mg | ORAL_TABLET | Freq: Every day | ORAL | Status: DC
  Administered 2020-11-15 – 2020-11-20 (×6): 400 mg via ORAL
  Filled 2020-11-15 (×6): qty 1

## 2020-11-15 MED ORDER — DOXYCYCLINE HYCLATE 100 MG PO TABS
100.0000 mg | ORAL_TABLET | Freq: Two times a day (BID) | ORAL | Status: AC
  Administered 2020-11-15 – 2020-11-20 (×10): 100 mg via ORAL
  Filled 2020-11-15 (×10): qty 1

## 2020-11-15 MED ORDER — POLYETHYLENE GLYCOL 3350 17 G PO PACK: 17.0000 g | PACK | Freq: Every day | ORAL | Status: DC | PRN

## 2020-11-15 MED ORDER — UMECLIDINIUM BROMIDE 62.5 MCG/INH IN AEPB
1.0000 | INHALATION_SPRAY | Freq: Every day | RESPIRATORY_TRACT | Status: DC
  Administered 2020-11-15: 1 via RESPIRATORY_TRACT
  Filled 2020-11-15: qty 7

## 2020-11-15 MED ORDER — METHYLPREDNISOLONE SODIUM SUCC 40 MG IJ SOLR
40.0000 mg | Freq: Two times a day (BID) | INTRAMUSCULAR | Status: AC
  Administered 2020-11-15 – 2020-11-16 (×2): 40 mg via INTRAVENOUS
  Filled 2020-11-15 (×2): qty 1

## 2020-11-15 MED ORDER — HEPARIN (PORCINE) 25000 UT/250ML-% IV SOLN
1050.0000 [IU]/h | INTRAVENOUS | Status: DC
  Administered 2020-11-15: 700 [IU]/h via INTRAVENOUS
  Administered 2020-11-16: 900 [IU]/h via INTRAVENOUS
  Filled 2020-11-15 (×2): qty 250

## 2020-11-15 MED ORDER — ALBUTEROL SULFATE HFA 108 (90 BASE) MCG/ACT IN AERS
2.0000 | INHALATION_SPRAY | Freq: Four times a day (QID) | RESPIRATORY_TRACT | Status: DC
  Administered 2020-11-15 – 2020-11-16 (×2): 2 via RESPIRATORY_TRACT
  Filled 2020-11-15 (×2): qty 6.7

## 2020-11-15 MED ORDER — ENOXAPARIN SODIUM 40 MG/0.4ML IJ SOSY: 40.0000 mg | PREFILLED_SYRINGE | INTRAMUSCULAR | Status: DC

## 2020-11-15 MED ORDER — ALPRAZOLAM 0.5 MG PO TABS
0.2500 mg | ORAL_TABLET | Freq: Three times a day (TID) | ORAL | Status: DC | PRN
  Administered 2020-11-16 – 2020-11-19 (×8): 0.5 mg via ORAL
  Filled 2020-11-15 (×8): qty 1

## 2020-11-15 MED ORDER — ASPIRIN 325 MG PO TABS
325.0000 mg | ORAL_TABLET | Freq: Every day | ORAL | Status: DC
  Administered 2020-11-15: 325 mg via ORAL
  Filled 2020-11-15 (×2): qty 1

## 2020-11-15 MED ORDER — PAROXETINE HCL 30 MG PO TABS
30.0000 mg | ORAL_TABLET | Freq: Every day | ORAL | Status: DC
  Administered 2020-11-15 – 2020-11-20 (×6): 30 mg via ORAL
  Filled 2020-11-15 (×6): qty 1

## 2020-11-15 MED ORDER — SENNA 8.6 MG PO TABS
1.0000 | ORAL_TABLET | Freq: Two times a day (BID) | ORAL | Status: DC
  Administered 2020-11-16: 8.6 mg via ORAL
  Filled 2020-11-15 (×7): qty 1

## 2020-11-15 MED ORDER — LORATADINE 10 MG PO TABS
10.0000 mg | ORAL_TABLET | Freq: Every day | ORAL | Status: DC
  Administered 2020-11-15 – 2020-11-20 (×6): 10 mg via ORAL
  Filled 2020-11-15 (×6): qty 1

## 2020-11-15 MED ORDER — HEPARIN BOLUS VIA INFUSION
3500.0000 [IU] | Freq: Once | INTRAVENOUS | Status: AC
  Administered 2020-11-15: 3500 [IU] via INTRAVENOUS
  Filled 2020-11-15: qty 3500

## 2020-11-15 MED ORDER — ACETAMINOPHEN 650 MG RE SUPP: 650.0000 mg | Freq: Four times a day (QID) | RECTAL | Status: DC | PRN

## 2020-11-15 MED ORDER — IPRATROPIUM-ALBUTEROL 0.5-2.5 (3) MG/3ML IN SOLN
3.0000 mL | Freq: Once | RESPIRATORY_TRACT | Status: AC
  Administered 2020-11-15: 3 mL via RESPIRATORY_TRACT
  Filled 2020-11-15: qty 3

## 2020-11-15 MED ORDER — METHYLPREDNISOLONE SODIUM SUCC 125 MG IJ SOLR: 125.0000 mg | Freq: Once | INTRAMUSCULAR | Status: DC

## 2020-11-15 MED ORDER — ACETAMINOPHEN 325 MG PO TABS: 650.0000 mg | ORAL_TABLET | Freq: Four times a day (QID) | ORAL | Status: DC | PRN

## 2020-11-15 MED ORDER — ONDANSETRON HCL 4 MG PO TABS: 4.0000 mg | ORAL_TABLET | Freq: Four times a day (QID) | ORAL | Status: DC | PRN

## 2020-11-15 NOTE — ED Provider Notes (Signed)
[facility] DEPT Provider Note   CSN: [id number] Arrival date & time: 11/15/20  1015     History Chief Complaint  Patient presents with  . Shortness of Breath    ETHER MIKOLAJCZYK is a 65 y.o. female with COPD on chronic steroid therapy, HLD, A1 AT carrier, anxiety, depression who presents the emergency department for evaluation of shortness of breath.  Patient states that she has had intermittent shortness of breath starting on Monday and started a steroid taper on Monday with today being the last day of her prednisone.  She woke this morning with worsening shortness of breath and administered 2 albuterol aerosols at home but had persistent shortness of breath.  She called EMS where she received an additional DuoNeb and an IM injection of methylprednisolone.  She was then brought to the emergency department for further evaluation.  On my evaluation, patient states that she feels improved compared to this morning, but still has mild shortness of breath.  Denies chest pain, abdominal pain, nausea, vomiting, headache, fever or other systemic symptoms.  Of note, she states that for the last month she has had persistent night sweats requiring her to change her pillowcase.  Denies weight loss.   Shortness of Breath Associated symptoms: cough and diaphoresis   Associated symptoms: no abdominal pain, no chest pain, no ear pain, no fever, no rash, no sore throat and no vomiting       Past Medical History:  Diagnosis Date  . C8 RADICULOPATHY 06/05/2009  . COPD 08/31/2007  . DEPRESSION 11/19/2006  . DYSLIPIDEMIA 03/12/2009  . MI (mitral incompetence)   . 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 OSTEOARTHRITIS 06/05/2009  . PARESTHESIA 08/24/2007  . TOBACCO ABUSE 01/25/2010    Patient Active Problem List   Diagnosis Date Noted  . Osteoporosis 06/05/2020  . COPD exacerbation ([place]) 03/24/2020  . Chronic diastolic CHF (congestive heart failure) ([place]) 03/24/2020  . Essential  hypertension 03/24/2020  . Vertebral fracture, osteoporotic ([place]) 03/05/2020  . COPD with acute exacerbation ([place]) 07/10/2019  . History of colonic polyps   . Benign neoplasm of ascending colon   . Neuritis 01/29/2017  . Alpha-1-antitrypsin deficiency carrier 01/19/2017  . Dependent edema 01/14/2017  . Allergic rhinitis 08/10/2015  . Chronic respiratory failure ([place]) 07/05/2015  . Hyperglycemia   . HLD (hyperlipidemia)   . Coronary artery calcification seen on CAT scan 07/02/2015  . Solitary pulmonary nodule 12/02/2013  . Loss of weight 11/10/2013  . Osteoarthritis 06/05/2009  . Brachial neuritis or radiculitis 06/05/2009  . Dyslipidemia 03/12/2009  . 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 COPD GOLD III  08/31/2007  . PARESTHESIA 08/24/2007  . Anxiety and depression 11/19/2006    Past Surgical History:  Procedure Laterality Date  . ABDOMINAL HYSTERECTOMY    . APPENDECTOMY  1969  . CARDIAC CATHETERIZATION N/A 07/05/2015   Procedure: Left Heart Cath and Coronary Angiography;  Surgeon: Leonie Man, MD;  Location: [facility];  Service: Cardiovascular;  Laterality: N/A;  . CHOLECYSTECTOMY  1989  . collapse lung  1990  . COLONOSCOPY WITH PROPOFOL N/A 05/14/2018   Procedure: COLONOSCOPY WITH PROPOFOL;  Surgeon: Irene Shipper, MD;  Location: [facility];  Service: Endoscopy;  Laterality: N/A;  . EXCISION MASS ABDOMINAL  2019  . EXPLORATORY LAPAROTOMY  1969  . POLYPECTOMY  05/14/2018   Procedure: POLYPECTOMY;  Surgeon: Irene Shipper, MD;  Location: [facility];  Service: Endoscopy;;  . SALPINGOOPHORECTOMY  2019     OB History   No  obstetric history on file.     Family History  Problem Relation Age of Onset  . Cancer Mother        lung  . Heart attack Mother   . Hypertension Sister   . Multiple sclerosis Brother   . Diabetes Sister   . Rheum arthritis Sister   . Thyroid disease Sister   . Multiple sclerosis Other   . Alcohol abuse Other   . Arthritis Other   .  Diabetes Other   . Kidney disease Other   . Cancer Other        lung,ovarian,skin, uterine  . Stroke Other   . Heart disease Other   . Melanoma Other   . Osteoporosis Other   . Heart attack Maternal Uncle   . Heart attack Other        NEICE  . Heart attack Other        NEPHEW  . Hypertension Brother   . Stroke Maternal Grandmother   . Colon cancer Neg Hx     Social History   Tobacco Use  . Smoking status: Former    Packs/day: 2.00    Years: 38.00    Pack years: 76.00    Types: Cigarettes    Quit date: 03/07/2014    Years since quitting: 6.6  . Smokeless tobacco: Never  Vaping Use  . Vaping Use: Never used  Substance Use Topics  . Alcohol use: No    Alcohol/week: 0.0 standard drinks  . Drug use: No    Home Medications Prior to Admission medications   Medication Sig Start Date End Date Taking? Authorizing Provider  albuterol (VENTOLIN HFA) 108 (90 Base) MCG/ACT inhaler Take two puffs every 4-6 hours as needed for shortness of breath/wheezing . 90 day supply Patient taking differently: Inhale 2 puffs into the lungs See admin instructions. Take two puffs every 4-6 hours as needed for shortness of breath/wheezing . 90 day supply 12/14/19   Martyn Ehrich, NP  ALPRAZolam Duanne Moron) 0.5 MG tablet TAKE 1/2 TO 1 TABLET BY MOUTH THREE TIMES DAILY 10/16/20   [name], NP  aspirin EC 81 MG EC tablet Take 1 tablet (81 mg total) by mouth daily. 07/08/15   Nita Sells, MD  Budeson-Glycopyrrol-Formoterol (BREZTRI AEROSPHERE) 160-9-4.8 MCG/ACT AERO Inhale 2 puffs into the lungs in the morning and at bedtime. 08/10/20   [name], MD  cetirizine (ZYRTEC) 10 MG tablet Take 10 mg by mouth daily.    [provider]  furosemide (LASIX) 40 MG tablet Take 1 tablet (40 mg total) by mouth daily. Patient taking differently: Take 40 mg by mouth as needed. 05/22/20   [name], MD  HYDROcodone-acetaminophen (NORCO) 7.5-325 MG tablet Take 1 tablet by mouth 2 (two)  times daily as needed (pain/cough). 10/24/20   Nafziger, Tommi Rumps, NP  HYDROcodone-acetaminophen (NORCO) 7.5-325 MG tablet Take 1 tablet by mouth every 6 (six) hours as needed for moderate pain. 10/24/20   Nafziger, Tommi Rumps, NP  HYDROcodone-acetaminophen (NORCO) 7.5-325 MG tablet Take 1 tablet by mouth every 6 (six) hours as needed for moderate pain. 10/24/20   Nafziger, Tommi Rumps, NP  ipratropium-albuterol (DUONEB) 0.5-2.5 (3) MG/3ML SOLN USE 3 ML VIA NEBULIZER EVERY 6 HOURS AS NEEDED.  DX: J44.9 04/04/20   [name], MD  losartan (COZAAR) 50 MG tablet Take 2 tablets (100 mg total) by mouth daily. 10/24/20   [name], MD  magnesium oxide (MAG-OX) 400 MG tablet Take 400 mg by mouth daily.  [provider]  nitroGLYCERIN (NITROSTAT) 0.4 MG SL tablet Place 1 tablet (0.4 mg total) under the tongue every 5 (five) minutes as needed for chest pain. 10/24/20   [name], MD  nystatin (MYCOSTATIN) 100000 UNIT/ML suspension Take 5 mLs (500,000 Units total) by mouth 4 (four) times daily. 09/18/20   Burchette, Alinda Sierras, MD  OXYGEN Inhale 2 L into the lungs continuous.    [provider]  PARoxetine (PAXIL) 30 MG tablet TAKE 1 TABLET EVERY DAY 10/31/20   Nafziger, Tommi Rumps, NP  rosuvastatin (CRESTOR) 20 MG tablet Take 1 tablet (20 mg total) by mouth daily. 10/24/20   [name], MD  Spacer/Aero Chamber Mouthpiece MISC 1 Device by Does not apply route as directed. 01/12/17   Javier Glazier, MD    Allergies    Ciprofloxacin, Lisinopril, and Penicillins  Review of Systems   Review of Systems  Constitutional:  Positive for diaphoresis. Negative for chills and fever.  HENT:  Negative for ear pain and sore throat.   Eyes:  Negative for pain and visual disturbance.  Respiratory:  Positive for cough and shortness of breath.   Cardiovascular:  Negative for chest pain and palpitations.  Gastrointestinal:  Negative for abdominal pain and vomiting.  Genitourinary:   Negative for dysuria and hematuria.  Musculoskeletal:  Negative for arthralgias and back pain.  Skin:  Negative for color change and rash.  Neurological:  Negative for seizures and syncope.  All other systems reviewed and are negative.  Physical Exam Updated Vital Signs BP 113/82   Pulse 97   Temp 98.4 F (36.9 C) (Oral)   Resp (!) 25   Ht '5\' 2"'$  (1.575 m)   Wt 59 kg   SpO2 95%   BMI 23.78 kg/m   Physical Exam Vitals and nursing note reviewed.  Constitutional:      General: She is not in acute distress.    Appearance: She is well-developed.  HENT:     Head: Normocephalic and atraumatic.  Eyes:     Conjunctiva/sclera: Conjunctivae normal.  Cardiovascular:     Rate and Rhythm: Regular rhythm. Tachycardia present.     Heart sounds: No murmur heard. Pulmonary:     Effort: Pulmonary effort is normal. No respiratory distress.     Breath sounds: Wheezing present.  Abdominal:     Palpations: Abdomen is soft.     Tenderness: There is no abdominal tenderness.  Musculoskeletal:     Cervical back: Neck supple.  Skin:    General: Skin is warm and dry.  Neurological:     Mental Status: She is alert.    ED Results / Procedures / Treatments   Labs (all labs ordered are listed, but only abnormal results are displayed) Labs Reviewed  RESP PANEL BY RT-PCR (FLU A&B, COVID) ARPGX2 - Abnormal; Notable for the following components:      Result Value   SARS Coronavirus 2 by RT PCR POSITIVE (*)    All other components within normal limits  COMPREHENSIVE METABOLIC PANEL - Abnormal; Notable for the following components:   Glucose, Bld 162 (*)    All other components within normal limits  CBC WITH DIFFERENTIAL/PLATELET - Abnormal; Notable for the following components:   WBC 19.6 (*)    Platelets 413 (*)    Neutro Abs 10.0 (*)    Lymphs Abs 8.3 (*)    Basophils Absolute 0.2 (*)    Abs Immature Granulocytes 0.11 (*)    All other components within normal  limits  BRAIN NATRIURETIC  PEPTIDE - Abnormal; Notable for the following components:   B Natriuretic Peptide 132.3 (*)    All other components within normal limits  TROPONIN I (HIGH SENSITIVITY) - Abnormal; Notable for the following components:   Troponin I (High Sensitivity) 74 (*)    All other components within normal limits  TROPONIN I (HIGH SENSITIVITY) - Abnormal; Notable for the following components:   Troponin I (High Sensitivity) 389 (*)    All other components within normal limits  PATHOLOGIST SMEAR REVIEW    EKG None  Radiology DG Chest 2 View  Result Date: 11/15/2020 CLINICAL DATA:  Shortness of breath. EXAM: CHEST - 2 VIEW COMPARISON:  09/22/2020, 09/19/2020 FINDINGS: The heart size and mediastinal contours are within normal limits. Stable chronic lung disease with mild hyperinflation and diffuse interstitial prominence, not significantly changed from prior exams. No superimposed airspace opacity, pleural effusion, or pneumothorax. Redemonstrated compression deformities in the midthoracic spine IMPRESSION: Stable chronic lung disease without superimposed acute process. Electronically Signed   By: Merilyn Baba MD   On: 11/15/2020 11:19    Procedures Procedures   Medications Ordered in ED Medications  albuterol (VENTOLIN HFA) 108 (90 Base) MCG/ACT inhaler 2 puff (has no administration in time range)  aspirin tablet 325 mg (325 mg Oral Given 11/15/20 1540)  ipratropium-albuterol (DUONEB) 0.5-2.5 (3) MG/3ML nebulizer solution 3 mL (3 mLs Nebulization Given 11/15/20 1203)  ipratropium-albuterol (DUONEB) 0.5-2.5 (3) MG/3ML nebulizer solution 3 mL (3 mLs Nebulization Given 11/15/20 1420)    ED Course  I have reviewed the triage vital signs and the nursing notes.  Pertinent labs & imaging results that were available during my care of the patient were reviewed by me and considered in my medical decision making (see chart for details).  Clinical Course as of 11/15/20 1743  Thu Nov 15, 2020  1511 Trop 389  [MK]  1725 Path Review: 11/15/2020 [MK]    Clinical Course User Index [MK] Adhira Jamil, Debe Coder, MD   MDM Rules/Calculators/A&P                           Patient seen in the emergency department for evaluation of dyspnea and concern for COPD exacerbation.  Initial physical exam reveals and expiratory wheezing but is otherwise unremarkable.  Laboratory evaluation reveals a leukocytosis to 19.6 likely elevated in the setting of her current steroid use.  COVID test positive, initial troponin 74, delta troponin 389.  Initial ECG and repeat ECG with no ST elevations or depressions or evidence of ischemia.  Patient given 2 DuoNeb's with improvement of her wheezing but will require admission for uptrending troponin, likely type II demand ischemia but cannot rule out ACS at this time.  Patient given 325 aspirin and admitted.  Heparin drip not initiated as patient likely not headed to the Cath Lab and heparin drip and isolated elevated troponin does not improve mortality. Final Clinical Impression(s) / ED Diagnoses Final diagnoses:  None    Rx / DC Orders ED Discharge Orders     None        Neylan Koroma, Debe Coder, MD 11/15/20 1743

## 2020-11-15 NOTE — H&P (Addendum)
ADMISSION HISTORY AND PHYSICAL   Kim [name] [id number] DOB: 1955/12/26 DOA: 11/15/2020  PCP: [name], NP Patient coming from: home  Chief Complaint: SOB  HPI:  65 year old with a history of COPD on chronic steroids, alpha 1 antitrypsin carrier, HLD, and anxiety/depression who presented to the ER with intermittent shortness of breath for 4 days unresponsive to a steroid taper and albuterol nebulizers.  She denied chest pain nausea or vomiting or abdominal pain.  In the emergency room she felt somewhat better after Solu-Medrol and high-dose inhaled beta agonist therapy, but she continued to wheeze and was not felt to be safe for discharge home.  Assessment/Plan  Acute bronchospastic COPD exacerbation Continue steroids and bronchodilators -monitor serial exam  COVID positive No clinical evidence that this represents a significant infection -interestingly the patient was COVID-positive 09/22/2020 as well -perhaps this is simply a persisting positive in the setting of chronic steroid use -we will not utilize any expectant treatment for now  Elevated troponins Likely related to demand ischemia in the setting of bronchodilator induced and respiratory distress induced tachycardia -no chest pain whatsoever -no acute EKG findings -follow trend  Addendum 8:30PM - f/u troponin higher - begin heparin gtt - discussed w/ Cards on call who agrees to see in AM - still no chest pain or evidence of STEMI  HLD Continue usual outpatient medication  DVT prophylaxis: lovenox  Code Status: FULL Family Communication: no family present at admit  Disposition Plan:  Admit to Inpatient  Consults called: none indicated  Review of Systems: As per HPI otherwise 10 point review of systems negative.   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    OSTEOARTHRITIS 06/05/2009   PARESTHESIA 08/24/2007   TOBACCO ABUSE 01/25/2010    Past Surgical History:  Procedure Laterality Date   ABDOMINAL HYSTERECTOMY     APPENDECTOMY  1969   CARDIAC CATHETERIZATION N/A 07/05/2015   Procedure: Left Heart Cath and Coronary Angiography;  Surgeon: Leonie Man, MD;  Location: [facility];  Service: Cardiovascular;  Laterality: N/A;   CHOLECYSTECTOMY  1989   collapse lung  1990   COLONOSCOPY WITH PROPOFOL N/A 05/14/2018   Procedure: COLONOSCOPY WITH PROPOFOL;  Surgeon: Irene Shipper, MD;  Location: [facility];  Service: Endoscopy;  Laterality: N/A;   EXCISION MASS ABDOMINAL  2019   EXPLORATORY LAPAROTOMY  1969   POLYPECTOMY  05/14/2018   Procedure: POLYPECTOMY;  Surgeon: Irene Shipper, MD;  Location: [facility];  Service: Endoscopy;;   SALPINGOOPHORECTOMY  2019    Family History  Family History  Problem Relation Age of Onset   Cancer Mother        lung   Heart attack Mother    Hypertension Sister    Multiple sclerosis Brother    Diabetes Sister    Rheum arthritis Sister    Thyroid disease Sister    Multiple sclerosis Other    Alcohol abuse Other    Arthritis Other    Diabetes Other    Kidney disease Other    Cancer Other        lung,ovarian,skin, uterine   Stroke Other    Heart disease Other    Melanoma Other    Osteoporosis Other    Heart attack Maternal Uncle    Heart attack Other  NEICE   Heart attack Other        NEPHEW   Hypertension Brother    Stroke Maternal Grandmother    Colon cancer Neg Hx     Social History   reports that she quit smoking about 6 years ago. Her smoking use included cigarettes. She has a 76.00 pack-year smoking history. She has never used smokeless tobacco. She reports that she does not drink alcohol and does not use drugs.  Allergies Allergies  Allergen Reactions   Tape Other (See Comments)    SKIN TEARS EASILY!!!!   Ciprofloxacin Other (See Comments)    Hallucinations     Lisinopril Cough   Penicillins Hives and Other (See Comments)    Tolerates Omnicef Has patient had a PCN reaction causing immediate rash, facial/tongue/throat swelling, SOB or lightheadedness with hypotension: Yes Has patient had a PCN reaction causing severe rash involving mucus membranes or skin necrosis: No Has patient had a PCN reaction that required hospitalization No Has patient had a PCN reaction occurring within the last 10 years: No If all of the above answers are "NO", then may proceed with Cephalosporin use.     Prior to Admission medications   Medication Sig Start Date End Date Taking? Authorizing Provider  albuterol (VENTOLIN HFA) 108 (90 Base) MCG/ACT inhaler Take two puffs every 4-6 hours as needed for shortness of breath/wheezing . 90 day supply Patient taking differently: Inhale 2 puffs into the lungs every 4 (four) hours as needed for shortness of breath or wheezing. 12/14/19  Yes Martyn Ehrich, NP  ALPRAZolam Duanne Moron) 0.5 MG tablet TAKE 1/2 TO 1 TABLET BY MOUTH THREE TIMES DAILY Patient taking differently: Take 0.25-0.5 mg by mouth every 6 (six) hours. 10/16/20  Yes Nafziger, Tommi Rumps, NP  aspirin EC 81 MG EC tablet Take 1 tablet (81 mg total) by mouth daily. 07/08/15  Yes Nita Sells, MD  Budeson-Glycopyrrol-Formoterol (BREZTRI AEROSPHERE) 160-9-4.8 MCG/ACT AERO Inhale 2 puffs into the lungs in the morning and at bedtime. 08/10/20  Yes [name], MD  cetirizine (ZYRTEC) 10 MG tablet Take 10 mg by mouth daily.   Yes [provider]  furosemide (LASIX) 40 MG tablet Take 1 tablet (40 mg total) by mouth daily. Patient taking differently: Take 40 mg by mouth daily as needed (for an overnight weight gain of 3-5 pounds of fluid). 05/22/20  Yes [name], MD  HYDROcodone-acetaminophen (NORCO) 7.5-325 MG tablet Take 1 tablet by mouth every 6 (six) hours as needed for moderate pain. 10/24/20  Yes Nafziger, Tommi Rumps, NP  ipratropium-albuterol (DUONEB) 0.5-2.5  (3) MG/3ML SOLN USE 3 ML VIA NEBULIZER EVERY 6 HOURS AS NEEDED.  DX: J44.9 Patient taking differently: Take 3 mLs by nebulization every 6 (six) hours as needed (for shortness of breath or wheezing- DX: J44.9). 04/04/20  Yes [name], MD  losartan (COZAAR) 50 MG tablet Take 75 mg by mouth daily. 10/24/20  Yes [name], MD  magnesium oxide (MAG-OX) 400 MG tablet Take 400 mg by mouth daily.   Yes [provider]  MUCINEX MAXIMUM STRENGTH 1200 MG TB12 Take 1,200 mg by mouth every 12 (twelve) hours.   Yes [provider]  nitroGLYCERIN (NITROSTAT) 0.4 MG SL tablet Place 1 tablet (0.4 mg total) under the tongue every 5 (five) minutes as needed for chest pain. 10/24/20  Yes [name], MD  nystatin (MYCOSTATIN) 100000 UNIT/ML suspension Take 5 mLs (500,000 Units total) by mouth 4 (four) times daily. Patient taking differently:  Take 5 mLs by mouth 4 (four) times daily as needed (for thrush). 09/18/20  Yes Burchette, Alinda Sierras, MD  OXYGEN Inhale 2 L/min into the lungs continuous.   Yes [provider]  PARoxetine (PAXIL) 30 MG tablet TAKE 1 TABLET EVERY DAY Patient taking differently: Take 30 mg by mouth in the morning. 10/31/20  Yes Nafziger, Tommi Rumps, NP  HYDROcodone-acetaminophen (NORCO) 7.5-325 MG tablet Take 1 tablet by mouth 2 (two) times daily as needed (pain/cough). Patient not taking: Reported on 11/15/2020 10/24/20   [name], NP  HYDROcodone-acetaminophen (NORCO) 7.5-325 MG tablet Take 1 tablet by mouth every 6 (six) hours as needed for moderate pain. Patient not taking: Reported on 11/15/2020 10/24/20   [name], NP  rosuvastatin (CRESTOR) 20 MG tablet Take 1 tablet (20 mg total) by mouth daily. 10/24/20   [name], MD  Spacer/Aero Chamber Mouthpiece MISC 1 Device by Does not apply route as directed. 01/12/17   Javier Glazier, MD    Physical Exam: Vitals:   11/15/20 1315 11/15/20 1330 11/15/20 1415 11/15/20 1500  BP:   117/79 105/73 113/82  Pulse: 96 (!) 101 100 97  Resp: 20 (!) 22 18 (!) 25  Temp:      TempSrc:      SpO2: 91% 96% 94% 95%  Weight:      Height:       General: Not in extremis Lungs: Diffuse expiratory wheezing with mildly prolonged expiratory phase Cardiovascular: Regular rate and rhythm without murmur gallop or rub normal S1 and S2 Abdomen: Nontender, nondistended, soft, bowel sounds positive, no rebound, no ascites, no appreciable mass Extremities: No significant cyanosis, clubbing, or edema bilateral lower extremities    Labs on Admission:   CBC: Recent Labs  Lab 11/15/20 1030  WBC 19.6*  NEUTROABS 10.0*  HGB 14.0  HCT 44.9  MCV 97.6  PLT 123XX123*   Basic Metabolic Panel: Recent Labs  Lab 11/15/20 1030  NA 142  K 3.6  CL 104  CO2 28  GLUCOSE 162*  BUN 20  CREATININE 0.90  CALCIUM 9.4   GFR: Estimated Creatinine Clearance: 48.6 mL/min (by C-G formula based on SCr of 0.9 mg/dL). Liver Function Tests: Recent Labs  Lab 11/15/20 1030  AST 21  ALT 18  ALKPHOS 71  BILITOT 0.5  PROT 6.8  ALBUMIN 4.1    Urine analysis:    Component Value Date/Time   COLORURINE yellow 02/26/2010 0951   APPEARANCEUR Clear 02/26/2010 0951   LABSPEC 1.025 02/26/2010 0951   PHURINE 5.0 02/26/2010 0951   HGBUR trace-lysed 02/26/2010 0951   BILIRUBINUR Negative 01/14/2018 0859   PROTEINUR Negative 01/14/2018 0859   UROBILINOGEN 0.2 01/14/2018 0859   UROBILINOGEN 0.2 02/26/2010 0951   NITRITE Negative 01/14/2018 0859   NITRITE negative 02/26/2010 0951   LEUKOCYTESUR Negative 01/14/2018 0859     Radiological Exams on Admission: DG Chest 2 View  Result Date: 11/15/2020 CLINICAL DATA:  Shortness of breath. EXAM: CHEST - 2 VIEW COMPARISON:  09/22/2020, 09/19/2020 FINDINGS: The heart size and mediastinal contours are within normal limits. Stable chronic lung disease with mild hyperinflation and diffuse interstitial prominence, not significantly changed from prior exams. No  superimposed airspace opacity, pleural effusion, or pneumothorax. Redemonstrated compression deformities in the midthoracic spine IMPRESSION: Stable chronic lung disease without superimposed acute process. Electronically Signed   By: Merilyn Baba MD   On: 11/15/2020 11:19    EKG: Independently reviewed.  Poor tracing but no evidence of acute ST changes.  Dellis Filbert  Hennie Duos, MD [facility] Office  [phone] Pager - Text Page per Amion as per below:  On-Call/Text Page:      [email or web address]  If 7PM-7AM, please contact night-coverage [email or web address] 11/15/2020, 6:26 PM

## 2020-11-15 NOTE — Chronic Care Management (AMB) (Signed)
Chronic Care Management Pharmacy Assistant   Name: Kim [name]  MRN: [id number] DOB: 1955/05/18  Reason for Encounter: Chart Review for initial visit with Kim [name] Clinical Pharmacist on 11-20-20 at 3 pm in office.   Conditions to be addressed/monitored: HTN, HLD, COPD, and Anxiety  Recent office visits:  10-18-2020 Kim Peng, NP - Patient presented for pain management. Stopped Ascorbic Avid daily, Calcium Carbonate, Cholecalciferol, Cyanocobalamin, Doxycycline, and Metoprolol  09-18-2020 Kim Post, MD - Patient presented for Oral thrush and other concerns. Prescribed Nystatin 500,000 4 times daily.   07-17-2020 Kim Peng, NP - Patient presented for COPD with acute exacerbation and other concerns. Prescribed Azithromycin 250 mg daily, Prednisone 10 mg, Hydrocodone - Acetaminophen 7.5-325 mg PRN  07-13-2020 Kim Peng, NP - Patient presented for COPD Gold III and other concerns. No medication changes.  Recent consult visits:  10-24-2020 Kim [name], RPH-CPP (Cardiology) - Patient presented for hypertension and other concerns. Prescribed Rosuvastatin 20 mg daily. Increased Losartan to 100 mg daily. Stopped Ginkgo Biloba 60 mg, Methocarbamol 500 mg and Prednisone 20 mg  08-10-2020 Kim Noel, MD (Pulmonology) - Patient presented for COPD with exacerbation and other concerns. Prescribed Budosen-Glycopyrrol- Formoterol 160-9-4.8 MCG and Changed Prednisone to 10 mg (40 mg total several times daily)  06-25-2020 Kim Blanks, MD (Cardiology) - Patient presented for Coronary artery disease and other concerns. Stopped Prednisone 5 mg  06-04-2020 Kim Shin, MD ( Endocrinology) - Patient presented for Osteoporosis fracture follow-up. No medication changes.  06-01-2020 Kim Noel, MD (Pulmonology) - Patient presented for COPD GOLD III and other concerns. Changed Prednisone to 5 mg every other day. Stopped Budeson- Glycoprttol  06-01-2020 Kim Blanks, MD (Cardiology) - Patient presented for Acute on chronic diastolic CHF. Changed Furosemide 40 mg daily. Stopped  Alendronate Sodium 70 mg.   Hospital visits:  Medication Reconciliation was completed by comparing discharge summary, patient's EMR and Pharmacy list, and upon discussion with patient.  Patient visited [facility] ED on  11-15-2020 due to shortness of breath.  Medications that remain the same after Hospital Discharge:??   -All other medications will remain the same.    Medication Reconciliation was completed by comparing discharge summary, patient's EMR and Pharmacy list, and upon discussion with patient.  Patient visited [facility] ED on 09-22-2020 due to COPD exacerbation. Patient was there for 5 hours.  New?Medications Started at [facility] Discharge:?? -started None   Medication Changes at Hospital Discharge: -Changed None  Medications Discontinued at Hospital Discharge: -Stopped None   Medications that remain the same after Hospital Discharge:??  -All other medications will remain the same.    Medications that remain the same after Hospital Discharge:??  -All other medications will remain the same.    Medication Reconciliation was completed by comparing discharge summary, patient's EMR and Pharmacy list, and upon discussion with patient.   Patient visited [facility] ED on 09-19-2020 due to COPD exacerbation. Patient was there for 8 hours.  New?Medications Started at [facility] Discharge:?? -started None   Medication Changes at Hospital Discharge: -Changed Prednisone 20 mg oral 2 times daily  Medications Discontinued at Hospital Discharge: -Stopped None   Medications that remain the same after Hospital Discharge:??  -All other medications will remain the same.    Medications: Facility-Administered Encounter Medications as of 11/15/2020  Medication   0.9 %  sodium chloride infusion    albuterol (VENTOLIN HFA) 108 (90 Base) MCG/ACT inhaler 2 puff  Outpatient Encounter Medications as of 11/15/2020  Medication Sig   albuterol (VENTOLIN HFA) 108 (90 Base) MCG/ACT inhaler Take two puffs every 4-6 hours as needed for shortness of breath/wheezing . 90 day supply (Patient taking differently: Inhale 2 puffs into the lungs See admin instructions. Take two puffs every 4-6 hours as needed for shortness of breath/wheezing . 90 day supply)   ALPRAZolam (XANAX) 0.5 MG tablet TAKE 1/2 TO 1 TABLET BY MOUTH THREE TIMES DAILY   aspirin EC 81 MG EC tablet Take 1 tablet (81 mg total) by mouth daily.   Budeson-Glycopyrrol-Formoterol (BREZTRI AEROSPHERE) 160-9-4.8 MCG/ACT AERO Inhale 2 puffs into the lungs in the morning and at bedtime.   cetirizine (ZYRTEC) 10 MG tablet Take 10 mg by mouth daily.   furosemide (LASIX) 40 MG tablet Take 1 tablet (40 mg total) by mouth daily. (Patient taking differently: Take 40 mg by mouth as needed.)   HYDROcodone-acetaminophen (NORCO) 7.5-325 MG tablet Take 1 tablet by mouth 2 (two) times daily as needed (pain/cough).   HYDROcodone-acetaminophen (NORCO) 7.5-325 MG tablet Take 1 tablet by mouth every 6 (six) hours as needed for moderate pain.   HYDROcodone-acetaminophen (NORCO) 7.5-325 MG tablet Take 1 tablet by mouth every 6 (six) hours as needed for moderate pain.   ipratropium-albuterol (DUONEB) 0.5-2.5 (3) MG/3ML SOLN USE 3 ML VIA NEBULIZER EVERY 6 HOURS AS NEEDED.  DX: J44.9   losartan (COZAAR) 50 MG tablet Take 2 tablets (100 mg total) by mouth daily.   magnesium oxide (MAG-OX) 400 MG tablet Take 400 mg by mouth daily.   nitroGLYCERIN (NITROSTAT) 0.4 MG SL tablet Place 1 tablet (0.4 mg total) under the tongue every 5 (five) minutes as needed for chest pain.   nystatin (MYCOSTATIN) 100000 UNIT/ML suspension Take 5 mLs (500,000 Units total) by mouth 4 (four) times daily.   OXYGEN Inhale 2 L into the lungs continuous.   PARoxetine (PAXIL) 30 MG tablet TAKE 1  TABLET EVERY DAY   rosuvastatin (CRESTOR) 20 MG tablet Take 1 tablet (20 mg total) by mouth daily.   Spacer/Aero Chamber Mouthpiece MISC 1 Device by Does not apply route as directed.    Fill History  :  ALPRAZOLAM 0.'5MG'$  TABLETS 10/16/2020 20   ALBUTEROL HFA INH(200 PUFFS)18GM 07/03/2020 50   Breztri Aerosphere 160 mcg-47mg-4.8mcg/actuation HFA aerosol inhaler 08/10/2020 90   furosemide 40 mg tablet 05/23/2020 90   HYDROCODONE/ACETAMINOPHEN 7.5-325 T 10/27/2020 11   losartan 50 mg tablet 10/11/2020 90   METOPROLOL TARTRATE '25MG'$  TABLETS 09/21/2020 14   nitroglycerin 0.4 mg sublingual tablet 10/24/2020 30   NYSTATIN ORAL SUSP 100000 UNIT/ML 09/18/2020 12   paroxetine 30 mg tablet 11/01/2020 90   rosuvastatin 20 mg tablet 10/24/2020 90   PREDNISONE '10MG'$  TABLETS 11/09/2020 5   IPRATROPI/ALB 0.5/'3MG'$  INH SL 30X3ML 04/04/2020 30   Care Gaps: Hepatitis C Screening - Overdue Zoster Vaccine - Overdue COVID Booster #3 (Therapist, music - Overdue Flu Vaccine - Overdue AWV - Scheduled 03-25-21 at 2:30  Star Rating Drugs: Rosuvastatin (Crestor) - Last [facility] 10-24-2020 [facility] - [facility]  Notes: 11-15-2020 Per notes patient is presently at the emergency room will try and call her on Monday for initial questions.  11-22-20 Patient will call and schedule per notes changed to non- billable  LHideawayClinical Pharmacist Assistant [phone]

## 2020-11-15 NOTE — Progress Notes (Signed)
ANTICOAGULATION CONSULT NOTE - Initial Consult  Pharmacy Consult for IV heparin Indication:  NSTEMI  Allergies  Allergen Reactions   Tape Other (See Comments)    SKIN TEARS EASILY!!!!   Ciprofloxacin Other (See Comments)    Hallucinations    Lisinopril Cough   Penicillins Hives and Other (See Comments)    Tolerates Omnicef Has patient had a PCN reaction causing immediate rash, facial/tongue/throat swelling, SOB or lightheadedness with hypotension: Yes Has patient had a PCN reaction causing severe rash involving mucus membranes or skin necrosis: No Has patient had a PCN reaction that required hospitalization No Has patient had a PCN reaction occurring within the last 10 years: No If all of the above answers are "NO", then may proceed with Cephalosporin use.     Patient Measurements: Height: '5\' 2"'$  (157.5 cm) Weight: 59 kg (130 lb) IBW/kg (Calculated) : 50.1 Heparin Dosing Weight: 59 kg  Vital Signs: Temp: 98.4 F (36.9 C) (08/11 1026) Temp Source: Oral (08/11 1026) BP: 119/91 (08/11 1800) Pulse Rate: 95 (08/11 1800)  Labs: Recent Labs    11/15/20 1030 11/15/20 1240 11/15/20 1845  HGB 14.0  --   --   HCT 44.9  --   --   PLT 413*  --   --   CREATININE 0.90  --   --   TROPONINIHS 74* 389* 746*    Estimated Creatinine Clearance: 48.6 mL/min (by C-G formula based on SCr of 0.9 mg/dL).   Medical History: 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   OSTEOARTHRITIS 06/05/2009   PARESTHESIA 08/24/2007   TOBACCO ABUSE 01/25/2010    Medications:  Scheduled:   albuterol  2 puff Inhalation Q6H   aspirin  325 mg Oral Daily   doxycycline  100 mg Oral Q12H   Guaifenesin  1,200 mg Oral Q12H   loratadine  10 mg Oral Daily   magnesium oxide  400 mg Oral Daily   methylPREDNISolone (SOLU-MEDROL) injection  40 mg Intravenous Q12H    Followed by   Derrill Memo ON 11/16/2020] predniSONE  40 mg Oral Q breakfast   PARoxetine  30 mg Oral Daily   rosuvastatin  20 mg Oral Daily   senna  1 tablet Oral BID   umeclidinium bromide  1 puff Inhalation Daily   Infusions:   sodium chloride     PRN: sodium chloride, [DISCONTINUED] acetaminophen **OR** acetaminophen, albuterol, ALPRAZolam, bisacodyl, ondansetron **OR** ondansetron (ZOFRAN) IV, oxyCODONE, polyethylene glycol  Assessment: 65 yo presented to ER with shortness of breath now to start IV heparin for possible NSTEMI. Baseline labs drawn.   Goal of Therapy:  Heparin level 0.3-0.7 units/ml Monitor platelets by anticoagulation protocol: Yes   Plan:  IV heparin 3500 unit bolus then IV heparin rate of 700 units/hr Check heparin level 6 hours after start of heparin Daily CBC  Kara Mead 11/15/2020,8:16 PM

## 2020-11-15 NOTE — ED Triage Notes (Signed)
Pt presents to ED via EMS cc sob. Pt states she began sob this am after walking to the bathroom. Pt states hx of copd and intermittent sob over the past couple of weeks. Pt given albuterol tx and 125 solumedrol enroute by EMS. Respirations equal, unlabored. A&Ox4.

## 2020-11-16 ENCOUNTER — Ambulatory Visit: Payer: Medicare Other | Admitting: Primary Care

## 2020-11-16 ENCOUNTER — Other Ambulatory Visit: Payer: Self-pay | Admitting: Student

## 2020-11-16 DIAGNOSIS — I700 Atherosclerosis of aorta: Secondary | ICD-10-CM

## 2020-11-16 DIAGNOSIS — R778 Other specified abnormalities of plasma proteins: Secondary | ICD-10-CM

## 2020-11-16 DIAGNOSIS — J441 Chronic obstructive pulmonary disease with (acute) exacerbation: Secondary | ICD-10-CM | POA: Diagnosis not present

## 2020-11-16 DIAGNOSIS — R0600 Dyspnea, unspecified: Secondary | ICD-10-CM

## 2020-11-16 DIAGNOSIS — I2510 Atherosclerotic heart disease of native coronary artery without angina pectoris: Secondary | ICD-10-CM | POA: Diagnosis not present

## 2020-11-16 DIAGNOSIS — I5042 Chronic combined systolic (congestive) and diastolic (congestive) heart failure: Secondary | ICD-10-CM

## 2020-11-16 DIAGNOSIS — R0609 Other forms of dyspnea: Secondary | ICD-10-CM

## 2020-11-16 LAB — COMPREHENSIVE METABOLIC PANEL
ALT: 18 U/L (ref 0–44)
ALT: 15 U/L (ref 0–44)
AST: 24 U/L (ref 15–41)
AST: 19 U/L (ref 15–41)
Albumin: 3.9 g/dL (ref 3.5–5.0)
Albumin: 2.9 g/dL — ABNORMAL LOW (ref 3.5–5.0)
Alkaline Phosphatase: 69 U/L (ref 38–126)
Alkaline Phosphatase: 49 U/L (ref 38–126)
Anion gap: 10 (ref 5–15)
Anion gap: 6 (ref 5–15)
BUN: 28 mg/dL — ABNORMAL HIGH (ref 8–23)
BUN: 27 mg/dL — ABNORMAL HIGH (ref 8–23)
CO2: 26 mmol/L (ref 22–32)
CO2: 24 mmol/L (ref 22–32)
Calcium: 9.9 mg/dL (ref 8.9–10.3)
Calcium: 7.7 mg/dL — ABNORMAL LOW (ref 8.9–10.3)
Chloride: 104 mmol/L (ref 98–111)
Chloride: 114 mmol/L — ABNORMAL HIGH (ref 98–111)
Creatinine, Ser: 0.86 mg/dL (ref 0.44–1.00)
Creatinine, Ser: 0.77 mg/dL (ref 0.44–1.00)
GFR, Estimated: >60 mL/min (ref >60)
GFR, Estimated: >60 mL/min (ref >60)
Glucose, Bld: 144 mg/dL — ABNORMAL HIGH (ref 70–99)
Glucose, Bld: 85 mg/dL (ref 70–99)
Potassium: 4.4 mmol/L (ref 3.5–5.1)
Potassium: 3 mmol/L — ABNORMAL LOW (ref 3.5–5.1)
Sodium: 140 mmol/L (ref 135–145)
Sodium: 144 mmol/L (ref 135–145)
Total Bilirubin: 0.5 mg/dL (ref 0.3–1.2)
Total Bilirubin: 0.5 mg/dL (ref 0.3–1.2)
Total Protein: 6.7 g/dL (ref 6.5–8.1)
Total Protein: 4.9 g/dL — ABNORMAL LOW (ref 6.5–8.1)

## 2020-11-16 LAB — HEPARIN LEVEL (UNFRACTIONATED)
Heparin Unfractionated: 0.28 IU/mL — ABNORMAL LOW (ref 0.30–0.70)
Heparin Unfractionated: 0.31 IU/mL (ref 0.30–0.70)
Heparin Unfractionated: 0.14 IU/mL — ABNORMAL LOW (ref 0.30–0.70)

## 2020-11-16 LAB — CBC
HCT: 42.9 % (ref 36.0–46.0)
Hemoglobin: 13.7 g/dL (ref 12.0–15.0)
MCH: 30.6 pg (ref 26.0–34.0)
MCHC: 31.9 g/dL (ref 30.0–36.0)
MCV: 96 fL (ref 80.0–100.0)
Platelets: 365 10*3/uL (ref 150–400)
RBC: 4.47 MIL/uL (ref 3.87–5.11)
RDW: 14.2 % (ref 11.5–15.5)
WBC: 16 10*3/uL — ABNORMAL HIGH (ref 4.0–10.5)
nRBC: 0 % (ref 0.0–0.2)

## 2020-11-16 LAB — D-DIMER, QUANTITATIVE: D-Dimer, Quant: 0.34 ug/mL-FEU (ref 0.00–0.50)

## 2020-11-16 LAB — MAGNESIUM: Magnesium: 2.3 mg/dL (ref 1.7–2.4)

## 2020-11-16 MED ORDER — ASPIRIN EC 81 MG PO TBEC
81.0000 mg | DELAYED_RELEASE_TABLET | Freq: Every day | ORAL | Status: DC
  Administered 2020-11-16 – 2020-11-20 (×5): 81 mg via ORAL
  Filled 2020-11-16 (×5): qty 1

## 2020-11-16 MED ORDER — METOPROLOL TARTRATE 50 MG PO TABS: 50.0000 mg | ORAL_TABLET | Freq: Once | ORAL | Status: DC | PRN

## 2020-11-16 MED ORDER — HYDROCODONE-ACETAMINOPHEN 5-325 MG PO TABS
1.0000 | ORAL_TABLET | ORAL | Status: DC | PRN
  Administered 2020-11-17 – 2020-11-19 (×3): 1 via ORAL
  Filled 2020-11-16 (×3): qty 1

## 2020-11-16 MED ORDER — LOSARTAN POTASSIUM 50 MG PO TABS
75.0000 mg | ORAL_TABLET | Freq: Every day | ORAL | Status: DC
  Administered 2020-11-16 – 2020-11-20 (×5): 75 mg via ORAL
  Filled 2020-11-16 (×2): qty 1
  Filled 2020-11-16: qty 3
  Filled 2020-11-16 (×2): qty 1

## 2020-11-16 MED ORDER — IPRATROPIUM-ALBUTEROL 20-100 MCG/ACT IN AERS
1.0000 | INHALATION_SPRAY | Freq: Four times a day (QID) | RESPIRATORY_TRACT | Status: DC
  Administered 2020-11-16 – 2020-11-19 (×8): 1 via RESPIRATORY_TRACT
  Filled 2020-11-16: qty 4

## 2020-11-16 MED ORDER — HEPARIN BOLUS VIA INFUSION
2000.0000 [IU] | Freq: Once | INTRAVENOUS | Status: AC
  Administered 2020-11-16: 2000 [IU] via INTRAVENOUS
  Filled 2020-11-16: qty 2000

## 2020-11-16 NOTE — ED Notes (Signed)
Patient is on 1.5L Oxygen via [place]

## 2020-11-16 NOTE — Progress Notes (Signed)
   Unable to do coronary CTA due to CT availability. Spoke with Marchia Bond who stated we could do outpatient coronary CTA next week. Dr. Sallyanne Kuster is OK with this. Went ahead and placed outpatient order and updated patient. If patient is still in the hospital on Monday, then we can arrange for it to be done as an inpatient.  Darreld Mclean, PA-C 11/16/2020 5:10 PM

## 2020-11-16 NOTE — Progress Notes (Signed)
ANTICOAGULATION CONSULT NOTE - follow up  Pharmacy Consult for IV heparin Indication:  NSTEMI  Allergies  Allergen Reactions   Tape Other (See Comments)    SKIN TEARS EASILY!!!!   Ciprofloxacin Other (See Comments)    Hallucinations    Lisinopril Cough   Penicillins Hives and Other (See Comments)    Tolerates Omnicef Has patient had a PCN reaction causing immediate rash, facial/tongue/throat swelling, SOB or lightheadedness with hypotension: Yes Has patient had a PCN reaction causing severe rash involving mucus membranes or skin necrosis: No Has patient had a PCN reaction that required hospitalization No Has patient had a PCN reaction occurring within the last 10 years: No If all of the above answers are "NO", then may proceed with Cephalosporin use.     Patient Measurements: Height: '5\' 2"'$  (157.5 cm) Weight: 59 kg (130 lb) IBW/kg (Calculated) : 50.1 Heparin Dosing Weight: 59 kg  Vital Signs: BP: 138/93 (08/12 1100) Pulse Rate: 103 (08/12 1100)  Labs: Recent Labs    11/15/20 1030 11/15/20 1240 11/15/20 1845 11/15/20 2130 11/16/20 0324 11/16/20 1000  HGB 14.0  --   --   --  13.7  --   HCT 44.9  --   --   --  42.9  --   PLT 413*  --   --   --  365  --   HEPARINUNFRC  --   --   --   --  0.28* 0.31  CREATININE 0.90  --   --   --  0.86  --   TROPONINIHS 74* 389* 746* 835*  --   --      Estimated Creatinine Clearance: 50.9 mL/min (by C-G formula based on SCr of 0.86 mg/dL).   Medical History: 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   OSTEOARTHRITIS 06/05/2009   PARESTHESIA 08/24/2007   TOBACCO ABUSE 01/25/2010    Medications:  Scheduled:   aspirin EC  81 mg Oral Daily   doxycycline  100 mg Oral Q12H   guaiFENesin  1,200 mg Oral Q12H   Ipratropium-Albuterol  1 puff Inhalation Q6H   loratadine  10 mg Oral Daily    losartan  75 mg Oral Daily   magnesium oxide  400 mg Oral Daily   PARoxetine  30 mg Oral Daily   predniSONE  40 mg Oral Q breakfast   rosuvastatin  20 mg Oral Daily   senna  1 tablet Oral BID   Infusions:   sodium chloride     heparin 850 Units/hr (11/16/20 0406)   PRN: sodium chloride, [DISCONTINUED] acetaminophen **OR** acetaminophen, albuterol, ALPRAZolam, bisacodyl, HYDROcodone-acetaminophen, metoprolol tartrate, ondansetron **OR** ondansetron (ZOFRAN) IV, polyethylene glycol  Assessment: 65 yo presented to ER with shortness of breath now to start IV heparin for possible NSTEMI. Baseline labs drawn.   11/16/20 11:52 AM  HL 0.31 now therapeutic on 850 units/hr CBC WNL No bleeding or line interruptions per RN   Goal of Therapy:  Heparin level 0.3-0.7 units/ml Monitor platelets by anticoagulation protocol: Yes   Plan:  Increase heparin infusion to 900 units/hr Check heparin level in 6 hours  Daily CBC  Ulice Dash D  11/16/2020, 11:46 AM

## 2020-11-16 NOTE — Progress Notes (Signed)
ANTICOAGULATION CONSULT NOTE - follow up  Pharmacy Consult for IV heparin Indication:  NSTEMI  Allergies  Allergen Reactions   Tape Other (See Comments)    SKIN TEARS EASILY!!!!   Ciprofloxacin Other (See Comments)    Hallucinations    Lisinopril Cough   Penicillins Hives and Other (See Comments)    Tolerates Omnicef Has patient had a PCN reaction causing immediate rash, facial/tongue/throat swelling, SOB or lightheadedness with hypotension: Yes Has patient had a PCN reaction causing severe rash involving mucus membranes or skin necrosis: No Has patient had a PCN reaction that required hospitalization No Has patient had a PCN reaction occurring within the last 10 years: No If all of the above answers are "NO", then may proceed with Cephalosporin use.     Patient Measurements: Height: '5\' 2"'$  (157.5 cm) Weight: 59 kg (130 lb) IBW/kg (Calculated) : 50.1 Heparin Dosing Weight: 59 kg  Vital Signs: Temp: 98.4 F (36.9 C) (08/12 1711) Temp Source: Oral (08/12 1711) BP: 132/84 (08/12 1915) Pulse Rate: 85 (08/12 1915)  Labs: Recent Labs    11/15/20 1030 11/15/20 1240 11/15/20 1845 11/15/20 2130 11/16/20 0324 11/16/20 1000 11/16/20 2103  HGB 14.0  --   --   --  13.7  --   --   HCT 44.9  --   --   --  42.9  --   --   PLT 413*  --   --   --  365  --   --   HEPARINUNFRC  --   --   --   --  0.28* 0.31 0.14*  CREATININE 0.90  --   --   --  0.86  --   --   TROPONINIHS 74* 389* 746* 835*  --   --   --      Estimated Creatinine Clearance: 50.9 mL/min (by C-G formula based on SCr of 0.86 mg/dL).   Medical History: 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   OSTEOARTHRITIS 06/05/2009   PARESTHESIA 08/24/2007   TOBACCO ABUSE 01/25/2010    Medications:  Scheduled:   aspirin EC  81 mg Oral Daily   doxycycline  100 mg Oral Q12H    guaiFENesin  1,200 mg Oral Q12H   Ipratropium-Albuterol  1 puff Inhalation Q6H   loratadine  10 mg Oral Daily   losartan  75 mg Oral Daily   magnesium oxide  400 mg Oral Daily   PARoxetine  30 mg Oral Daily   predniSONE  40 mg Oral Q breakfast   rosuvastatin  20 mg Oral Daily   senna  1 tablet Oral BID   Infusions:   sodium chloride     heparin 900 Units/hr (11/16/20 1924)   PRN: sodium chloride, [DISCONTINUED] acetaminophen **OR** acetaminophen, albuterol, ALPRAZolam, bisacodyl, HYDROcodone-acetaminophen, metoprolol tartrate, ondansetron **OR** ondansetron (ZOFRAN) IV, polyethylene glycol  Assessment: 65 yo presented to ER with shortness of breath now to start IV heparin for possible NSTEMI. Baseline labs drawn.   11/16/20 9:33 PM  HL 0.14 subtherapeutic on 900 units/hr CBC WNL No bleeding or line interruptions per RN   Goal of Therapy:  Heparin level 0.3-0.7 units/ml Monitor platelets by anticoagulation protocol: Yes   Plan:  Give heparin 2000 units bolus x1 Increase heparin infusion to 1050 units/hr Check heparin level in 6 hours  Daily CBC F/u plans for coronary  CTA  Dimple Nanas, PharmD 11/16/2020 9:35 PM

## 2020-11-16 NOTE — ED Notes (Signed)
20G Iv started in the right forearm. Patient ambulated to the bedside commode and became very winded. Patient was placed on 3L [place]. Patient is now back in bed and on 1.5 [place].

## 2020-11-16 NOTE — Progress Notes (Signed)
ANTICOAGULATION CONSULT NOTE - follow up  Pharmacy Consult for IV heparin Indication:  NSTEMI  Allergies  Allergen Reactions   Tape Other (See Comments)    SKIN TEARS EASILY!!!!   Ciprofloxacin Other (See Comments)    Hallucinations    Lisinopril Cough   Penicillins Hives and Other (See Comments)    Tolerates Omnicef Has patient had a PCN reaction causing immediate rash, facial/tongue/throat swelling, SOB or lightheadedness with hypotension: Yes Has patient had a PCN reaction causing severe rash involving mucus membranes or skin necrosis: No Has patient had a PCN reaction that required hospitalization No Has patient had a PCN reaction occurring within the last 10 years: No If all of the above answers are "NO", then may proceed with Cephalosporin use.     Patient Measurements: Height: '5\' 2"'$  (157.5 cm) Weight: 59 kg (130 lb) IBW/kg (Calculated) : 50.1 Heparin Dosing Weight: 59 kg  Vital Signs: BP: 123/70 (08/12 0215) Pulse Rate: 87 (08/12 0215)  Labs: Recent Labs    11/15/20 1030 11/15/20 1240 11/15/20 1845 11/15/20 2130 11/16/20 0324  HGB 14.0  --   --   --  13.7  HCT 44.9  --   --   --  42.9  PLT 413*  --   --   --  365  HEPARINUNFRC  --   --   --   --  0.28*  CREATININE 0.90  --   --   --   --   TROPONINIHS 74* 389* 746* 835*  --      Estimated Creatinine Clearance: 48.6 mL/min (by C-G formula based on SCr of 0.9 mg/dL).   Medical History: 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   OSTEOARTHRITIS 06/05/2009   PARESTHESIA 08/24/2007   TOBACCO ABUSE 01/25/2010    Medications:  Scheduled:   albuterol  2 puff Inhalation Q6H   aspirin  325 mg Oral Daily   doxycycline  100 mg Oral Q12H   guaiFENesin  1,200 mg Oral Q12H   loratadine  10 mg Oral Daily   magnesium oxide  400 mg Oral Daily   methylPREDNISolone  (SOLU-MEDROL) injection  40 mg Intravenous Q12H   Followed by   predniSONE  40 mg Oral Q breakfast   PARoxetine  30 mg Oral Daily   rosuvastatin  20 mg Oral Daily   senna  1 tablet Oral BID   umeclidinium bromide  1 puff Inhalation Daily   Infusions:   sodium chloride     heparin 700 Units/hr (11/15/20 2123)   PRN: sodium chloride, [DISCONTINUED] acetaminophen **OR** acetaminophen, albuterol, ALPRAZolam, bisacodyl, ondansetron **OR** ondansetron (ZOFRAN) IV, oxyCODONE, polyethylene glycol  Assessment: 65 yo presented to ER with shortness of breath now to start IV heparin for possible NSTEMI. Baseline labs drawn.   HL 0.28 sub-therapeutic on 700 units/hr CBC WNL No bleeding or line interruptions per RN   Goal of Therapy:  Heparin level 0.3-0.7 units/ml Monitor platelets by anticoagulation protocol: Yes   Plan:  Increase heparin to 850 units/hr Check heparin level in 6 hours  Daily CBC  Juanelle Desai RPh 11/16/2020, 3:59 AM

## 2020-11-16 NOTE — Progress Notes (Signed)
PROGRESS NOTE    Kim [name]  [id number] DOB: October 08, 1955 DOA: 11/15/2020 PCP: [name], NP    Brief Narrative:  65 year old with history of COPD on chronic steroids, alpha-1 antitrypsin carrier, hyperlipidemia, anxiety and depression, persistent COVID-19 positivity and mild nonobstructive coronary artery disease presented to emergency room with intermittent shortness of breath and episodes of tachycardia and hyperventilation since last 4 days.  Multiple rounds of nebulizers helped to improve the symptoms.  Admitted with COPD exacerbation.  Also found to have mildly elevated troponins.   Assessment & Plan:   Active Problems:   COPD with acute exacerbation ([place])  COPD with acute exacerbation: Agree with admission to monitored unit because of severity of symptoms. Aggressive bronchodilator therapy, IV steroids, inhalational steroids, scheduled and as needed bronchodilators, deep breathing exercises, incentive spirometry, chest physiotherapy and respiratory therapy consult. Antibiotics due to severity of symptoms.  Doxycycline for 7 days. Supplemental oxygen to keep saturations more than 92%. D-dimer 0.34.  Negative CT study for pulmonary embolism 2 months ago.  No indication to repeat.  Elevated troponins in a patient with mild coronary artery disease: Currently chest pain-free.  Seen by cardiology. Remains on aspirin.  Currently on heparin infusion.  Plan for CT coronaries. Already on statin.  Added low-dose beta-blockers and losartan.  Anxiety and depression: Significant symptoms of anxiety and panic attacks.  Taking Xanax.  COVID-19 positivity: Persistent positive COVID-19 test with no obvious symptoms.  May be exacerbating current episodes.  No indication for COVID-19 directed therapeutics.  DVT prophylaxis:   Heparin infusion.   Code Status: Full code Family Communication: Husband on the phone and outside in the lobby. Disposition Plan: Status is:  Inpatient  Remains inpatient appropriate because:Inpatient level of care appropriate due to severity of illness  Dispo: The patient is from: Home              Anticipated d/c is to: Home              Patient currently is not medically stable to d/c.   Difficult to place patient No         Consultants:  Cardiology  Procedures:  None  Antimicrobials:  Doxycycline 8/11---   Subjective: Patient seen and examined.  She was seen in the emergency room.  At rest, she was able to talk to me and keep up with conversation for over half an hour.  She wanted to demonstrate what happens to her. She walked from her bed to sit in a chair and within 2 to 3 minutes she started hyperventilating, became tachypneic and tachycardic with maintaining oxygen saturation.  Started having tremulousness.  Oxygen maintained throughout.  She is looking at the monitor and getting more anxious.  Improved with dose of Xanax.  Objective: Vitals:   11/16/20 0800 11/16/20 0831 11/16/20 1100 11/16/20 1400  BP: 125/68  (!) 138/93 123/70  Pulse: 83 81 (!) 103 89  Resp: (!) 23 (!) 26 (!) 26 (!) 22  Temp:      TempSrc:      SpO2: 93% 96% 97% 98%  Weight:      Height:        Intake/Output Summary (Last 24 hours) at 11/16/2020 1645 Last data filed at 11/16/2020 0658 Gross per 24 hour  Intake --  Output 300 ml  Net -300 ml   Filed Weights   11/15/20 1027  Weight: 59 kg    Examination:  General exam: Appears calm and comfortable at rest.  Extremely anxious and  tachypneic on mobility. Respiratory system: No added sounds. Cardiovascular system: S1 & S2 heard, tachycardia.   Gastrointestinal system: Abdomen is nondistended, soft and nontender. No organomegaly or masses felt. Normal bowel sounds heard. Central nervous system: Alert and oriented. No focal neurological deficits. Extremities: Symmetric 5 x 5 power. Skin: No rashes, lesions or ulcers Psychiatry: Judgement and insight appear normal.   Anxious.    Data Reviewed: I have personally reviewed following labs and imaging studies  CBC: Recent Labs  Lab 11/15/20 1030 11/16/20 0324  WBC 19.6* 16.0*  NEUTROABS 10.0*  --   HGB 14.0 13.7  HCT 44.9 42.9  MCV 97.6 96.0  PLT 413* [id number]   Basic Metabolic Panel: Recent Labs  Lab 11/15/20 1030 11/16/20 0324  NA 142 140  K 3.6 4.4  CL 104 104  CO2 28 26  GLUCOSE 162* 144*  BUN 20 28*  CREATININE 0.90 0.86  CALCIUM 9.4 9.9  MG  --  2.3   GFR: Estimated Creatinine Clearance: 50.9 mL/min (by C-G formula based on SCr of 0.86 mg/dL). Liver Function Tests: Recent Labs  Lab 11/15/20 1030 11/16/20 0324  AST 21 24  ALT 18 18  ALKPHOS 71 69  BILITOT 0.5 0.5  PROT 6.8 6.7  ALBUMIN 4.1 3.9   No results for input(s): LIPASE, AMYLASE in the last 168 hours. No results for input(s): AMMONIA in the last 168 hours. Coagulation Profile: No results for input(s): INR, PROTIME in the last 168 hours. Cardiac Enzymes: No results for input(s): CKTOTAL, CKMB, CKMBINDEX, TROPONINI in the last 168 hours. BNP (last 3 results) Recent Labs    05/22/20 1404  PROBNP 147   HbA1C: No results for input(s): HGBA1C in the last 72 hours. CBG: No results for input(s): GLUCAP in the last 168 hours. Lipid Profile: No results for input(s): CHOL, HDL, LDLCALC, TRIG, CHOLHDL, LDLDIRECT in the last 72 hours. Thyroid Function Tests: No results for input(s): TSH, T4TOTAL, FREET4, T3FREE, THYROIDAB in the last 72 hours. Anemia Panel: No results for input(s): VITAMINB12, FOLATE, FERRITIN, TIBC, IRON, RETICCTPCT in the last 72 hours. Sepsis Labs: No results for input(s): PROCALCITON, LATICACIDVEN in the last 168 hours.  Recent Results (from the past 240 hour(s))  Resp Panel by RT-PCR (Flu A&B, Covid) Nasopharyngeal Swab     Status: Abnormal   Collection Time: 11/15/20 10:33 AM   Specimen: Nasopharyngeal Swab; Nasopharyngeal(NP) swabs in vial transport medium  Result Value Ref Range Status    SARS Coronavirus 2 by RT PCR POSITIVE (A) NEGATIVE Final    Comment: RESULT CALLED TO, READ BACK BY AND VERIFIED WITH: FRANKLIN C. 11/15/2020 @ 1317 BY MECIAL J. (NOTE) SARS-CoV-2 target nucleic acids are DETECTED.  The SARS-CoV-2 RNA is generally detectable in upper respiratory specimens during the acute phase of infection. Positive results are indicative of the presence of the identified virus, but do not rule out bacterial infection or co-infection with other pathogens not detected by the test. Clinical correlation with patient history and other diagnostic information is necessary to determine patient infection status. The expected result is Negative.  Fact Sheet for Patients: EntrepreneurPulse.com.au  Fact Sheet for Healthcare Providers: IncredibleEmployment.be  This test is not yet approved or cleared by the [place] FDA and  has been authorized for detection and/or diagnosis of SARS-CoV-2 by FDA under an Emergency Use Authorization (EUA).  This EUA will remain in effect (meaning this te st can be used) for the duration of  the COVID-19 declaration under Section 564(b)(1) of the Act,  21 U.S.C. section 360bbb-3(b)(1), unless the authorization is terminated or revoked sooner.     Influenza A by PCR NEGATIVE NEGATIVE Final   Influenza B by PCR NEGATIVE NEGATIVE Final    Comment: (NOTE) The Xpert Xpress SARS-CoV-2/FLU/RSV plus assay is intended as an aid in the diagnosis of influenza from Nasopharyngeal swab specimens and should not be used as a sole basis for treatment. Nasal washings and aspirates are unacceptable for Xpert Xpress SARS-CoV-2/FLU/RSV testing.  Fact Sheet for Patients: EntrepreneurPulse.com.au  Fact Sheet for Healthcare Providers: IncredibleEmployment.be  This test is not yet approved or cleared by the [place] FDA and has been authorized for detection and/or diagnosis of  SARS-CoV-2 by FDA under an Emergency Use Authorization (EUA). This EUA will remain in effect (meaning this test can be used) for the duration of the COVID-19 declaration under Section 564(b)(1) of the Act, 21 U.S.C. section 360bbb-3(b)(1), unless the authorization is terminated or revoked.  Performed at [facility], [place] [address] [place], [place] [address]          Radiology Studies: DG Chest 2 View  Result Date: 11/15/2020 CLINICAL DATA:  Shortness of breath. EXAM: CHEST - 2 VIEW COMPARISON:  09/22/2020, 09/19/2020 FINDINGS: The heart size and mediastinal contours are within normal limits. Stable chronic lung disease with mild hyperinflation and diffuse interstitial prominence, not significantly changed from prior exams. No superimposed airspace opacity, pleural effusion, or pneumothorax. Redemonstrated compression deformities in the midthoracic spine IMPRESSION: Stable chronic lung disease without superimposed acute process. Electronically Signed   By: Merilyn Baba MD   On: 11/15/2020 11:19        Scheduled Meds:  aspirin EC  81 mg Oral Daily   doxycycline  100 mg Oral Q12H   guaiFENesin  1,200 mg Oral Q12H   Ipratropium-Albuterol  1 puff Inhalation Q6H   loratadine  10 mg Oral Daily   losartan  75 mg Oral Daily   magnesium oxide  400 mg Oral Daily   PARoxetine  30 mg Oral Daily   predniSONE  40 mg Oral Q breakfast   rosuvastatin  20 mg Oral Daily   senna  1 tablet Oral BID   Continuous Infusions:  sodium chloride     heparin 900 Units/hr (11/16/20 1254)     LOS: 1 day    Time spent: 25 minutes    Barb Merino, MD [facility] Pager [phone]

## 2020-11-16 NOTE — Consult Note (Addendum)
Cardiology Consultation:   Patient ID: Kim [name] MRN: [id number]; DOB: Jun 04, 1955  Admit date: 11/15/2020 Date of Consult: 11/16/2020  PCP:  [name], NP   [facility] Providers Cardiologist:  Lauree Chandler, MD   {  Patient Profile:   Kim [name] is a 65 y.o. female with a history of mild non-obstructive CAD on cardiac catheterization in 2017, non-ischemic cardiomyopathy (possibly stress induced cardiomyopathy)/ chronic combined CHF with EF as low as 25-35% in 2017 but improved to 60-65% in 07/2019, COPD on 2L of O2 with ambulation followed by Pulmonology, hypertension, hyperlipidemia, and prior tobacco abuse (quit in 2015) who is being seen for the evaluation of elevated troponin at the request of Dr. Thereasa Solo.  History of Present Illness:   Kim [name] is a 65 year old female with the above history who is followed by Dr. Angelena Form. Patient was admitted in 06/2015 with chest pain and shortness of breath. Cardiac catheterization at that time showed minimal CAD with severe LV systolic dysfunction. Echo showed LVEF of 25-30% with normal wall motions and grade 1 diastolic dysfunction. Felt to possibly be stressed-induced cardiomyopathy after recent COPD exacerbation. She was started on GDMT and EF ultimately normalized. Last Echo in 07/2019 showed LVEF of 60-65% with normal wall motion and grade 1 diastolic dysfunction. RV normal in size and function. Patient was last seen by Dr. Angelena Form in 06/2020 at which time she was stable from a cardiac standpoint. She was seen in our [facility] in 10/2020 at which time her BP was 138/82. Losartan was increased at that visit.  Patient presented to the [facility] ED on 11/16/2020 with shortness of breath. Patient states that for the past month she has had episodes of shortness of breath, shaking, weakness, and diaphoresis with activity. She states anytime she gets up she will get short of breath, then start shaking and sweating. Then,  her BP will shoot up. She denies any chest pain with this. She states this is consistent with her prior COPD exacerbation but states it has been worse the last month. She does have anxiety and admits that she has panic attacks when this happens which just makes it worse. She had a severe episode yesterday where she got so short of breath that she could not talk. No real improvement with breathing treatments. EMS was called and she was transported to the ED. She also reports some orthopnea which is new over the last month. No PND or edema. No chest pain. She denies any recent fevers or illnesses. She does states that she sweats a lot during the day and at night. She states she often has to change her sheets because they are so wet. No body aches, chills or weight loss. She has a chronic productive cough with her COPD but this is stable. No nasal congestion. No GI symptoms. No abnormal bleeding in urine or stools. She denies any hemoptysis but states she will sometimes get a taste of blood in her mouth and then she will spit out phlegm that is red.Last episode of this was about 5 days ago.  In the ED, vitals stable. EKG showed sinus tachycardia, rate 102 bpm, with Q waves in inferior leads and leads V3-V5 and nonspecific ST/T changes. High-sensitivity troponin elevated at 74 >> 389 >> 746 >> 835. BNP mildly elevated at 132. Chest x-ray showed stable chronic lung disease but no acute findings. WBC 19.6, Hgb 14.0, Plts 413. Na 142, K 3.6, Glucose 162, BUN 20, Cr 0.90. LFTs  normal. COVID-19 positive (also positive on 09/22/2020). Patient was admitted for COPD exacerbation. IV Heparin was started due to rising troponin and Cardiology was consulted.  At the time of this evaluation, patient resting comfortably in no acute distress. She demonstrated one of the episodes described above while I was in the room. She ambulated to the bedside commode without any supplemental O2. O2 sats sat remained above 95%p; however, she  quickly began to feel more short of breath. Then, she became to shake /tremor, sweat a little, her heart rate increased to about 120 bpm, and her BP spiked to 181/113. She states this is what has been happening over the last month.  Of note, patient states she initially tested positive for COVID on 08/16/2020 at her PCPs office.Sounds like she was treated with antibiotics and monoclonal antibody infusions. She then had several negative home tests but tested positive again during and ED visit for COPD exacerbation on 09/22/2020. Continues to test positive this admission.  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   OSTEOARTHRITIS 06/05/2009   PARESTHESIA 08/24/2007   TOBACCO ABUSE 01/25/2010    Past Surgical History:  Procedure Laterality Date   ABDOMINAL HYSTERECTOMY     APPENDECTOMY  1969   CARDIAC CATHETERIZATION N/A 07/05/2015   Procedure: Left Heart Cath and Coronary Angiography;  Surgeon: Leonie Man, MD;  Location: [place] [facility];  Service: Cardiovascular;  Laterality: N/A;   CHOLECYSTECTOMY  1989   collapse lung  1990   COLONOSCOPY WITH PROPOFOL N/A 05/14/2018   Procedure: COLONOSCOPY WITH PROPOFOL;  Surgeon: Irene Shipper, MD;  Location: [facility];  Service: Endoscopy;  Laterality: N/A;   EXCISION MASS ABDOMINAL  2019   EXPLORATORY LAPAROTOMY  1969   POLYPECTOMY  05/14/2018   Procedure: POLYPECTOMY;  Surgeon: Irene Shipper, MD;  Location: [facility];  Service: Endoscopy;;   SALPINGOOPHORECTOMY  2019     Home Medications:  Prior to Admission medications   Medication Sig Start Date End Date Taking? Authorizing Provider  albuterol (VENTOLIN HFA) 108 (90 Base) MCG/ACT inhaler Take two puffs every 4-6 hours as needed for shortness of breath/wheezing . 90 day supply Patient taking differently: Inhale 2 puffs into the lungs every 4  (four) hours as needed for shortness of breath or wheezing. 12/14/19  Yes Martyn Ehrich, NP  ALPRAZolam Duanne Moron) 0.5 MG tablet TAKE 1/2 TO 1 TABLET BY MOUTH THREE TIMES DAILY Patient taking differently: Take 0.25-0.5 mg by mouth every 6 (six) hours. 10/16/20  Yes Nafziger, Tommi Rumps, NP  aspirin EC 81 MG EC tablet Take 1 tablet (81 mg total) by mouth daily. 07/08/15  Yes Nita Sells, MD  Budeson-Glycopyrrol-Formoterol (BREZTRI AEROSPHERE) 160-9-4.8 MCG/ACT AERO Inhale 2 puffs into the lungs in the morning and at bedtime. 08/10/20  Yes [name], MD  cetirizine (ZYRTEC) 10 MG tablet Take 10 mg by mouth daily.   Yes [provider]  furosemide (LASIX) 40 MG tablet Take 1 tablet (40 mg total) by mouth daily. Patient taking differently: Take 40 mg by mouth daily as needed (for an overnight weight gain of 3-5 pounds of fluid). 05/22/20  Yes [name], MD  HYDROcodone-acetaminophen (NORCO) 7.5-325 MG tablet Take 1 tablet by mouth every 6 (six) hours as needed for moderate pain. 10/24/20  Yes Nafziger, Tommi Rumps, NP  ipratropium-albuterol (DUONEB) 0.5-2.5 (3) MG/3ML  SOLN USE 3 ML VIA NEBULIZER EVERY 6 HOURS AS NEEDED.  DX: J44.9 Patient taking differently: Take 3 mLs by nebulization every 6 (six) hours as needed (for shortness of breath or wheezing- DX: J44.9). 04/04/20  Yes [name], MD  losartan (COZAAR) 50 MG tablet Take 75 mg by mouth daily. 10/24/20  Yes [name], MD  magnesium oxide (MAG-OX) 400 MG tablet Take 400 mg by mouth daily.   Yes [provider]  MUCINEX MAXIMUM STRENGTH 1200 MG TB12 Take 1,200 mg by mouth every 12 (twelve) hours.   Yes [provider]  nitroGLYCERIN (NITROSTAT) 0.4 MG SL tablet Place 1 tablet (0.4 mg total) under the tongue every 5 (five) minutes as needed for chest pain. 10/24/20  Yes [name], MD  nystatin (MYCOSTATIN) 100000 UNIT/ML suspension Take 5 mLs (500,000 Units total) by mouth 4 (four) times  daily. Patient taking differently: Take 5 mLs by mouth 4 (four) times daily as needed (for thrush). 09/18/20  Yes Burchette, Alinda Sierras, MD  OXYGEN Inhale 2 L/min into the lungs continuous.   Yes [provider]  PARoxetine (PAXIL) 30 MG tablet TAKE 1 TABLET EVERY DAY Patient taking differently: Take 30 mg by mouth in the morning. 10/31/20  Yes Nafziger, Tommi Rumps, NP  rosuvastatin (CRESTOR) 20 MG tablet Take 1 tablet (20 mg total) by mouth daily. 10/24/20   [name], MD  Spacer/Aero Chamber Mouthpiece MISC 1 Device by Does not apply route as directed. 01/12/17   Javier Glazier, MD    Inpatient Medications: Scheduled Meds:  albuterol  2 puff Inhalation Q6H   aspirin  325 mg Oral Daily   doxycycline  100 mg Oral Q12H   guaiFENesin  1,200 mg Oral Q12H   loratadine  10 mg Oral Daily   magnesium oxide  400 mg Oral Daily   PARoxetine  30 mg Oral Daily   predniSONE  40 mg Oral Q breakfast   rosuvastatin  20 mg Oral Daily   senna  1 tablet Oral BID   umeclidinium bromide  1 puff Inhalation Daily   Continuous Infusions:  sodium chloride     heparin 850 Units/hr (11/16/20 0406)   PRN Meds: sodium chloride, [DISCONTINUED] acetaminophen **OR** acetaminophen, albuterol, ALPRAZolam, bisacodyl, HYDROcodone-acetaminophen, ondansetron **OR** ondansetron (ZOFRAN) IV, polyethylene glycol  Allergies:    Allergies  Allergen Reactions   Tape Other (See Comments)    SKIN TEARS EASILY!!!!   Ciprofloxacin Other (See Comments)    Hallucinations    Lisinopril Cough   Penicillins Hives and Other (See Comments)    Tolerates Omnicef Has patient had a PCN reaction causing immediate rash, facial/tongue/throat swelling, SOB or lightheadedness with hypotension: Yes Has patient had a PCN reaction causing severe rash involving mucus membranes or skin necrosis: No Has patient had a PCN reaction that required hospitalization No Has patient had a PCN reaction occurring within the last 10 years:  No If all of the above answers are "NO", then may proceed with Cephalosporin use.     Social History:   Social History   Socioeconomic History   Marital status: Married    Spouse name: [name]   Number of children: 2   Years of education: Not on file   Highest education level: Not on file  Occupational History   Occupation: Patient care aide-sits with elderly lady  Tobacco Use   Smoking status: Former    Packs/day: 2.00    Years: 38.00    Pack years: 76.00  Types: Cigarettes    Quit date: 03/07/2014    Years since quitting: 6.7   Smokeless tobacco: Never  Vaping Use   Vaping Use: Never used  Substance and Sexual Activity   Alcohol use: No    Alcohol/week: 0.0 standard drinks   Drug use: No   Sexual activity: Not on file  Other Topics Concern   Not on file  Social History Narrative   [facility] (01/12/17):   Originally from [facility]. Has lived in [place] for 17 years. Married. Has 2 dogs currently. No mold and only 1 indoor plant. Had her home professionally cleaned a month ago. No bird or hot tub exposure. 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    Social Determinants of Health   Financial Resource Strain: Low Risk    Difficulty of Paying Living Expenses: Not hard at all  Food Insecurity: No Food Insecurity   Worried About Charity fundraiser in the Last Year: Never true   [place] in the Last Year: Never true  Transportation Needs: No Transportation Needs   Lack of Transportation (Medical): No   Lack of Transportation (Non-Medical): No  Physical Activity: Inactive   Days of Exercise per Week: 0 days   Minutes of Exercise per Session: 0 min  Stress: No Stress Concern Present   Feeling of Stress : Not at all  Social Connections: Moderately Integrated   Frequency of Communication with Friends and Family: More than three times a week   Frequency of Social Gatherings with Friends and Family: More than three times a week   Attends  Religious Services: More than 4 times per year   Active Member of [employer] or Organizations: No   Attends Music therapist: Never   Marital Status: Married  Human resources officer Violence: Not At Risk   Fear of Current or Ex-Partner: No   Emotionally Abused: No   Physically Abused: No   Sexually Abused: No    Family History:    Family History  Problem Relation Age of Onset   Cancer Mother        lung   Heart attack Mother    Hypertension Sister    Multiple sclerosis Brother    Diabetes Sister    Rheum arthritis Sister    Thyroid disease Sister    Multiple sclerosis Other    Alcohol abuse Other    Arthritis Other    Diabetes Other    Kidney disease Other    Cancer Other        lung,ovarian,skin, uterine   Stroke Other    Heart disease Other    Melanoma Other    Osteoporosis Other    Heart attack Maternal Uncle    Heart attack Other        [place]   Heart attack Other        NEPHEW   Hypertension Brother    Stroke Maternal Grandmother    Colon cancer Neg Hx      ROS:  Please see the history of present illness.  Review of Systems  Constitutional:  Positive for diaphoresis. Negative for chills, fever and weight loss.  HENT:  Negative for congestion and nosebleeds.   Respiratory:  Positive for cough, sputum production, shortness of breath and wheezing. Negative for hemoptysis.   Cardiovascular:  Positive for orthopnea. Negative for chest pain, palpitations, leg swelling and PND.  Gastrointestinal:  Negative for blood in stool, melena and nausea.  Genitourinary:  Negative for hematuria.  Musculoskeletal:  Negative for myalgias.  Neurological:  Negative for dizziness and loss of consciousness.  Endo/Heme/Allergies:  Does not bruise/bleed easily.  Psychiatric/Behavioral:  Substance abuse: prior tobacco abuse - quit in 2015.     Physical Exam/Data:   Vitals:   11/16/20 0400 11/16/20 0500 11/16/20 0800 11/16/20 0831  BP: 115/65 115/71 125/68   Pulse: 78 81 83  81  Resp: (!) 22 19 (!) 23 (!) 26  Temp:      TempSrc:      SpO2: 93% 93% 93% 96%  Weight:      Height:        Intake/Output Summary (Last 24 hours) at 11/16/2020 0939 Last data filed at 11/16/2020 0658 Gross per 24 hour  Intake --  Output 300 ml  Net -300 ml   Last 3 Weights 11/15/2020 10/18/2020 09/22/2020  Weight (lbs) 130 lb 130 lb 135 lb  Weight (kg) 58.968 kg 58.968 kg 61.236 kg     Body mass index is 23.78 kg/m.  General: 65 y.o. Caucasian female resting comfortably in no acute distress.  HEENT: Normocephalic and atraumatic. Sclera clear.  Neck: Supple. No JVD. Heart: Tachycardic with regular rate. Distinct S1 and S2. No murmurs, gallops, or rubs. Radial pulses 2+ and equal bilaterally. Lungs: No increased work of breathing. Diffuse rhonchi heard throughout. No crackles.  Abdomen: Soft, non-distended, and non-tender to palpation.  MSK: Normal strength and tone for age. Extremities: No lower extremity edema.    Skin: Warm and dry. Neuro: Alert and oriented x3. No focal deficits. Psych: Normal affect. Responds appropriately.  EKG:  The EKG was personally reviewed and demonstrates:   - EKG from 11/15/2020: Sinus tachycardia, rate 102 bpm, with Q waves in inferior leads and anterior leads and non-specific ST/T changes.  - EKG form 11/16/2020: Normal sinus rhythm, rate 86 bpm, with continue Q waves and non-specific ST/T changes.  Telemetry:  Telemetry was personally reviewed and demonstrates:  Sinus rhythm with rates ranging from the 80s to 120s (elevated rates occur when she is ambulating and gets short of breath).  Relevant CV Studies:  Left Cardiac Catheterization 07/05/2015: Angiographically minimal coronary disease There is severe left ventricular systolic dysfunction. 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   Recommendations: Return the CCU/TCU for post radial cath care and ongoing  treatment of nonischemic cardiomyopathy Continue aggressive risk modification and diuresis. Treat underlying cause for stress induced cardiomyopathy _______________  Echocardiogram 07/11/2019: Impressions: 1. Left ventricular ejection fraction, by estimation, is 60 to 65%. The  left ventricle has normal function. The left ventricle has no regional  wall motion abnormalities. Left ventricular diastolic parameters are  consistent with Grade I diastolic  dysfunction (impaired relaxation).   2. Right ventricular systolic function is normal. The right ventricular  size is normal. Tricuspid regurgitation signal is inadequate for assessing  PA pressure.   3. The mitral valve is grossly normal. No evidence of mitral valve  regurgitation. No evidence of mitral stenosis.   4. The aortic valve is tricuspid. Aortic valve regurgitation is mild.  Mild aortic valve sclerosis is present, with no evidence of aortic valve  stenosis.   5. The inferior vena cava is normal in size with greater than 50%  respiratory variability, suggesting right atrial pressure of 3 mmHg.   Comparison(s): A prior study was performed on 09/03/2016. No significant  change from prior study.   Laboratory Data:  High Sensitivity Troponin:  Recent Labs  Lab 11/15/20 1030 11/15/20 1240 11/15/20 1845 11/15/20 2130  TROPONINIHS 74* 389* 746* 835*     Chemistry Recent Labs  Lab 11/15/20 1030 11/16/20 0324  NA 142 140  K 3.6 4.4  CL 104 104  CO2 28 26  GLUCOSE 162* 144*  BUN 20 28*  CREATININE 0.90 0.86  CALCIUM 9.4 9.9  GFRNONAA >60 >60  ANIONGAP 10 10    Recent Labs  Lab 11/15/20 1030 11/16/20 0324  PROT 6.8 6.7  ALBUMIN 4.1 3.9  AST 21 24  ALT 18 18  ALKPHOS 71 69  BILITOT 0.5 0.5   Hematology Recent Labs  Lab 11/15/20 1030 11/16/20 0324  WBC 19.6* 16.0*  RBC 4.60 4.47  HGB 14.0 13.7  HCT 44.9 42.9  MCV 97.6 96.0  MCH 30.4 30.6  MCHC 31.2 31.9  RDW 14.3 14.2  PLT 413* 365   BNP Recent  Labs  Lab 11/15/20 1030  BNP 132.3*    DDimer No results for input(s): DDIMER in the last 168 hours.   Radiology/Studies:  DG Chest 2 View  Result Date: 11/15/2020 CLINICAL DATA:  Shortness of breath. EXAM: CHEST - 2 VIEW COMPARISON:  09/22/2020, 09/19/2020 FINDINGS: The heart size and mediastinal contours are within normal limits. Stable chronic lung disease with mild hyperinflation and diffuse interstitial prominence, not significantly changed from prior exams. No superimposed airspace opacity, pleural effusion, or pneumothorax. Redemonstrated compression deformities in the midthoracic spine IMPRESSION: Stable chronic lung disease without superimposed acute process. Electronically Signed   By: Merilyn Baba MD   On: 11/15/2020 11:19     Assessment and Plan:   Demand Ischemia vs NSTEMI History of Minimal CAD - History of minimal CAD noted on cardiac cath in 2017.  Patient presented with shortness of breath and was admitted for acute COPD exacerbation. Found to have elevated troponin. She denies any chest pain throughout any of this but describes episodes of shortness of breath, shaking, and diaphoresis with activity over the last month. - High-sensitivity troponin 74 >> 389 >> 746 >> 835. - EKG showed Q waves in inferior and anterior leads and non-specific ST/T changes.  - Will order Echo.  - Patient has not had any chest pain with this. - Continue IV Heparin for now. - Continue aspirin and high-intensity statin. Will hold off on beta-blocker right now due to acute COPD exacerbation. - Will continue to trend troponin to peak. Possibly demand ischemic in the setting of acute COPD exacerbation. Would not attempt Lexiscan Myoview in setting of COPD exacerbation with wheezing. Will discuss with MD about possible cardiac catheterization.  Non-Ischemic Cardiomyopathy Chronic Combined CHF - BNP only minimally elevated in the 130s. - Chest x-ray showed no edema. - LVEF as low as 25-30% in  2017. Normalized to 60-65% on last Echo in 07/2019.  - Appears euvolemic on exam. - Continue home Lasix '40mg'$  daily PRN for weight gain. - Will restart home Losartan '75mg'$  daily. - Monitor daily weights, strict I/Os, and renal function.  Hypertension - BP initially soft but stable and rising. - Will restart home Losartan '75mg'$  daily.  Hyperlipidemia - Recent lipid panel on 10/18/2020: Total Cholesterol 206, Triglycerides 195, HD 50.6, LDL 117.  - Crestor '20mg'$  daily was recommended at time of last check but patient did not start. However, it was started on admission. Agree with this. - Will need repeat lipid panel and LFTs in about 2 months.  COPD Exacerbation - Management per primary team.  COVID Positive -  Tested positive on admission. However, initially tested positive on 08/16/2020 and then again on 09/22/2020 after several negative home tests. - Management per primary team.    Risk Assessment/Risk Scores:   TIMI Risk Score for Unstable Angina or Non-ST Elevation MI:   The patient's TIMI risk score is 4, which indicates a 20% risk of all cause mortality, new or recurrent myocardial infarction or need for urgent revascularization in the next 14 days.{   New York Heart Association (NYHA) Functional Class NYHA Class III. However, COPD main cause of dyspnea.   For questions or updates, please contact [place] Please consult [email or web address] for contact info under    Signed, Kim Mclean, Kim [name]  11/16/2020 9:39 AM   I have seen and examined the patient along with Kim Mclean, Kim [name].  I have reviewed the chart, notes and new data.  I agree with PA/NP's note.  Key new complaints: She denies chest discomfort at rest, with activity or with her episodes of tremulousness and spikes in blood pressure.  These episodes sound like sudden hyperadrenergic response to some type of stressor causing simultaneous increases in heart rate and blood pressure and tremor.  She reports that  it occurs consistently whenever she simply stands up for a longer period of time, whether or not she wears the oxygen.  The spells began after her COVID-19 infection, but have been steadily worsening and are particularly bad in the last couple of weeks. Key examination changes: Severely diminished breath sounds throughout, but no wheezes or rales.  Normal cardiovascular exam including regular rate and rhythm, absence of murmurs or gallops, no edema or jugular venous distention. Key new findings / data: Mild elevation in cardiac troponin I, peaking at about 800.  Elevated WBC (chronically high, on steroids), normal renal function, normal D-dimer.  She does have coronary calcifications and aortic atherosclerosis seen on the CT from September 20, 2020.  ECG does not show any acute ischemic changes.  Cardiac catheterization from March 2017 showed "angiographically minimal coronary disease".  Echocardiogram from 2017 was highly consistent with Takotsubo syndrome, all subsequent echocardiograms have shown normal LV wall motion and overall EF.  PLAN: Her biggest health problem is clearly chronic obstructive lung disease.  She is a "pink puffer" and oxygen saturation levels are never low, but she probably recruits all her resources to maintain normal oxygenation with physical activity.  Not sure to what degree anxiety is also contributory. The fact that her symptoms began after her COVID-19 infection and have been steadily worsening with persistently positive COVID antigen test, also points to her lungs as a likely cause of her complaints. Concern was raised for possible pulmonary embolism as a cause for her symptoms and elevation in troponin.  She had a normal CT angiogram on June 16 when her symptoms initially began to be bothersome.  Her D-dimer is low today.  Highly unlikely that she has a pulmonary embolism. I also also think it is very unlikely that this represents an acute coronary syndrome, notwithstanding the  mild elevation in cardiac troponin I.  I do not think she needs invasive coronary angiography based on the data available so far.  Recommend coronary CT angiogram.  She has no wheezing today and I think she can tolerate a low-dose of metoprolol to bring her heart rate down to a point where her scan would be interpretable. We will try to avoid Lexiscan nuclear perfusion study due to her reactive airway disease. She is already on a statin and  aspirin.  Hold losartan for coronary CT angiogram.  Sanda Klein, MD, [facility] [phone] 11/16/2020, 11:30 AM

## 2020-11-16 NOTE — Progress Notes (Signed)
Ordered outpatient coronary CTA for further evaluation of dyspnea on exertion and elevated troponin. Initial plan was to do this as an inpatient but unable to do this due to CT availability. Spoke with Marchia Bond and outpatient scan can likely be done next. Per Dr. Sallyanne Kuster, OK to give Lopressor '50mg'$  about 1.5-2 hours prior to scan. Patient states she already has this at home.  Darreld Mclean, PA-C 11/16/2020 12:59 PM

## 2020-11-17 ENCOUNTER — Encounter (HOSPITAL_COMMUNITY): Payer: Self-pay | Admitting: Internal Medicine

## 2020-11-17 ENCOUNTER — Inpatient Hospital Stay (HOSPITAL_COMMUNITY): Payer: Medicare Other

## 2020-11-17 DIAGNOSIS — I5032 Chronic diastolic (congestive) heart failure: Secondary | ICD-10-CM

## 2020-11-17 DIAGNOSIS — I214 Non-ST elevation (NSTEMI) myocardial infarction: Secondary | ICD-10-CM

## 2020-11-17 DIAGNOSIS — J441 Chronic obstructive pulmonary disease with (acute) exacerbation: Secondary | ICD-10-CM | POA: Diagnosis not present

## 2020-11-17 DIAGNOSIS — E782 Mixed hyperlipidemia: Secondary | ICD-10-CM

## 2020-11-17 DIAGNOSIS — I10 Essential (primary) hypertension: Secondary | ICD-10-CM

## 2020-11-17 LAB — BASIC METABOLIC PANEL
Anion gap: 9 (ref 5–15)
BUN: 29 mg/dL — ABNORMAL HIGH (ref 8–23)
CO2: 26 mmol/L (ref 22–32)
Calcium: 9.6 mg/dL (ref 8.9–10.3)
Chloride: 107 mmol/L (ref 98–111)
Creatinine, Ser: 0.81 mg/dL (ref 0.44–1.00)
GFR, Estimated: >60 mL/min (ref >60)
Glucose, Bld: 135 mg/dL — ABNORMAL HIGH (ref 70–99)
Potassium: 4.6 mmol/L (ref 3.5–5.1)
Sodium: 142 mmol/L (ref 135–145)

## 2020-11-17 LAB — CBC
HCT: 43.2 % (ref 36.0–46.0)
Hemoglobin: 13.9 g/dL (ref 12.0–15.0)
MCH: 31 pg (ref 26.0–34.0)
MCHC: 32.2 g/dL (ref 30.0–36.0)
MCV: 96.4 fL (ref 80.0–100.0)
Platelets: 354 10*3/uL (ref 150–400)
RBC: 4.48 MIL/uL (ref 3.87–5.11)
RDW: 14.2 % (ref 11.5–15.5)
WBC: 21.6 10*3/uL — ABNORMAL HIGH (ref 4.0–10.5)
nRBC: 0 % (ref 0.0–0.2)

## 2020-11-17 LAB — MAGNESIUM: Magnesium: 2.3 mg/dL (ref 1.7–2.4)

## 2020-11-17 LAB — ECHOCARDIOGRAM COMPLETE
Area-P 1/2: 3.6 cm2
Calc EF: 45.8 %
Height: 62 in
P 1/2 time: 313 msec
S' Lateral: 2.3 cm
Single Plane A2C EF: 46.1 %
Single Plane A4C EF: 47.6 %
Weight: 2080 oz

## 2020-11-17 LAB — HEPARIN LEVEL (UNFRACTIONATED)
Heparin Unfractionated: 1.01 IU/mL — ABNORMAL HIGH (ref 0.30–0.70)
Heparin Unfractionated: 0.53 IU/mL (ref 0.30–0.70)
Heparin Unfractionated: 0.55 IU/mL (ref 0.30–0.70)

## 2020-11-17 LAB — PHOSPHORUS: Phosphorus: 4.2 mg/dL (ref 2.5–4.6)

## 2020-11-17 MED ORDER — PERFLUTREN LIPID MICROSPHERE
1.0000 mL | INTRAVENOUS | Status: AC | PRN
  Administered 2020-11-17: 3 mL via INTRAVENOUS
  Filled 2020-11-17: qty 10

## 2020-11-17 MED ORDER — ORAL CARE MOUTH RINSE
15.0000 mL | Freq: Two times a day (BID) | OROMUCOSAL | Status: DC
  Administered 2020-11-17 – 2020-11-19 (×6): 15 mL via OROMUCOSAL

## 2020-11-17 MED ORDER — HEPARIN (PORCINE) 25000 UT/250ML-% IV SOLN
950.0000 [IU]/h | INTRAVENOUS | Status: DC
  Administered 2020-11-17 (×2): 950 [IU]/h via INTRAVENOUS
  Filled 2020-11-17 (×2): qty 250

## 2020-11-17 NOTE — Progress Notes (Signed)
  Echocardiogram 2D Echocardiogram with defintiy has been performed.  Darlina Sicilian M 11/17/2020, 8:14 AM

## 2020-11-17 NOTE — Progress Notes (Addendum)
DAILY PROGRESS NOTE   Patient Name: Kim [name] Date of Encounter: 11/17/2020 Cardiologist: Kim Chandler, MD  Chief Complaint   Shortness of breath, no chest pain  Patient Profile   Kim [name] is a 65 y.o. female with a history of mild non-obstructive CAD on cardiac catheterization in 2017, non-ischemic cardiomyopathy (possibly stress induced cardiomyopathy)/ chronic combined CHF with EF as low as 25-35% in 2017 but improved to 60-65% in 07/2019, COPD on 2L of O2 with ambulation followed by Pulmonology, hypertension, hyperlipidemia, and prior tobacco abuse (quit in 2015) who is being seen for the evaluation of elevated troponin at the request of Dr. Thereasa [name].  Subjective   Short of breath overnight - no chest pain. On IV Heparin. Echo reviewed personally and demonstrates LVEF about 35 to 40% with mid to apical anterior, apical and inferoapical severe hypokinesis.  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  Since her last cath showed minimal coronary disease is very possible this could be a repeat Takotsubo event, however it is also possible she could have developed new coronary disease in the past 5 years.  Objective   Vitals:   11/16/20 2215 11/17/20 0020 11/17/20 0537 11/17/20 0944  BP: 110/72 119/71 (!) 142/80 134/81  Pulse: 86 81 83 90  Resp: (!) '21 18 18 20  '$ Temp:  (!) 97.5 F (36.4 C) 97.6 F (36.4 C) 97.8 F (36.6 C)  TempSrc:  Oral Oral Oral  SpO2: 97% 98% 99% 99%  Weight:      Height:        Intake/Output Summary (Last 24 hours) at 11/17/2020 [id number] Last data filed at 11/17/2020 0507 Gross per 24 hour  Intake 518.7 ml  Output --  Net 518.7 ml   Filed Weights   11/15/20 1027  Weight: 59 kg    Physical Exam   General appearance: alert, no distress, and mildly obese Neck: no carotid bruit, no JVD, and thyroid not enlarged, symmetric, no tenderness/mass/nodules Lungs:  diminished breath sounds bilaterally and rhonchi bilaterally Heart: regular rate and rhythm Abdomen: soft, non-tender; bowel sounds normal; no masses,  no organomegaly Extremities: extremities normal, atraumatic, no cyanosis or edema Pulses: 2+ and symmetric Skin: pale, cool, dry Neurologic: Grossly normal Psych: Pleasant  Inpatient Medications    Scheduled Meds:  aspirin EC  81 mg Oral Daily   doxycycline  100 mg Oral Q12H   guaiFENesin  1,200 mg Oral Q12H   Ipratropium-Albuterol  1 puff Inhalation Q6H   loratadine  10 mg Oral Daily   losartan  75 mg Oral Daily   magnesium oxide  400 mg Oral Daily   mouth rinse  15 mL Mouth Rinse BID   PARoxetine  30 mg Oral Daily   predniSONE  40 mg Oral Q breakfast   rosuvastatin  20 mg Oral Daily   senna  1 tablet Oral BID    Continuous Infusions:  heparin 950 Units/hr (11/17/20 [id number])    PRN Meds: [DISCONTINUED] acetaminophen **OR** acetaminophen, albuterol, ALPRAZolam, bisacodyl, HYDROcodone-acetaminophen, metoprolol tartrate, ondansetron **OR** ondansetron (ZOFRAN) IV, perflutren lipid microspheres (DEFINITY) IV suspension, polyethylene glycol   Labs   Results for orders placed or performed during the hospital encounter of 11/15/20 (from the past 48 hour(s))  Comprehensive metabolic panel     Status: Abnormal   Collection Time: 11/15/20 10:30 AM  Result Value Ref Range   Sodium 142 135 - 145 mmol/L   Potassium 3.6 3.5 -  5.1 mmol/L   Chloride 104 98 - 111 mmol/L   CO2 28 22 - 32 mmol/L   Glucose, Bld 162 (H) 70 - 99 mg/dL    Comment: Glucose reference range applies only to samples taken after fasting for at least 8 hours.   BUN 20 8 - 23 mg/dL   Creatinine, Ser 0.90 0.44 - 1.00 mg/dL   Calcium 9.4 8.9 - 10.3 mg/dL   Total Protein 6.8 6.5 - 8.1 g/dL   Albumin 4.1 3.5 - 5.0 g/dL   AST 21 15 - 41 U/L   ALT 18 0 - 44 U/L   Alkaline Phosphatase 71 38 - 126 U/L   Total Bilirubin 0.5 0.3 - 1.2 mg/dL   GFR, Estimated >60 >60 mL/min     Comment: (NOTE) Calculated using the CKD-EPI Creatinine Equation (2021)    Anion gap 10 5 - 15    Comment: Performed at [facility], [place] [address] [place], [place] [address]  CBC with Differential     Status: Abnormal   Collection Time: 11/15/20 10:30 AM  Result Value Ref Range   WBC 19.6 (H) 4.0 - 10.5 K/uL   RBC 4.60 3.87 - 5.11 MIL/uL   Hemoglobin 14.0 12.0 - 15.0 g/dL   HCT 44.9 36.0 - 46.0 %   MCV 97.6 80.0 - 100.0 fL   MCH 30.4 26.0 - 34.0 pg   MCHC 31.2 30.0 - 36.0 g/dL   RDW 14.3 11.5 - 15.5 %   Platelets 413 (H) 150 - 400 K/uL   nRBC 0.0 0.0 - 0.2 %   Neutrophils Relative % 50 %   Neutro Abs 10.0 (H) 1.7 - 7.7 K/uL   Lymphocytes Relative 42 %   Lymphs Abs 8.3 (H) 0.7 - 4.0 K/uL   Monocytes Relative 5 %   Monocytes Absolute 1.0 0.1 - 1.0 K/uL   Eosinophils Relative 1 %   Eosinophils Absolute 0.1 0.0 - 0.5 K/uL   Basophils Relative 1 %   Basophils Absolute 0.2 (H) 0.0 - 0.1 K/uL   Immature Granulocytes 1 %   Abs Immature Granulocytes 0.11 (H) 0.00 - 0.07 K/uL   Reactive, Benign Lymphocytes PRESENT     Comment: Performed at [facility], [place] [address], [place], [place] [address]  Troponin I (High Sensitivity)     Status: Abnormal   Collection Time: 11/15/20 10:30 AM  Result Value Ref Range   Troponin I (High Sensitivity) 74 (H) <18 ng/L    Comment: (NOTE) Elevated high sensitivity troponin I (hsTnI) values and significant  changes across serial measurements may suggest ACS but many other  chronic and acute conditions are known to elevate hsTnI results.  Refer to the "Links" section for chest pain algorithms and additional  guidance. Performed at [facility], [place] [address], [place], [place] [address]   Brain natriuretic peptide     Status: Abnormal   Collection Time: 11/15/20 10:30 AM  Result Value Ref Range   B Natriuretic Peptide 132.3 (H) 0.0 - 100.0 pg/mL    Comment: Performed at [facility], [place] [address] [place],  [address]  Pathologist smear review     Status: None   Collection Time: 11/15/20 10:30 AM  Result Value Ref Range   Path Review 11/15/2020     Comment: LEUKOCYTOSIS WITH NEUTROPHILIA AND REACTIVE APPEARING LYMPHOCYTES. MILD THROMBOCYTOSIS. Reviewed by Kalman Drape, M.D. Performed at [facility], [place] [address] [place], [place]  [address]   Resp Panel by RT-PCR (Flu A&B, Covid) Nasopharyngeal Swab     Status: Abnormal   Collection Time: 11/15/20 10:33 AM   Specimen: Nasopharyngeal Swab; Nasopharyngeal(NP) swabs in vial transport medium  Result Value Ref Range   SARS Coronavirus 2 by RT PCR POSITIVE (A) NEGATIVE    Comment: RESULT CALLED TO, READ BACK BY AND VERIFIED WITH: FRANKLIN C. 11/15/2020 @ 1317 BY MECIAL J. (NOTE) SARS-CoV-2 target nucleic acids are DETECTED.  The SARS-CoV-2 RNA is generally detectable in upper respiratory specimens during the acute phase of infection. Positive results are indicative of the presence of the identified virus, but do not rule out bacterial infection or co-infection with other pathogens not detected by the test. Clinical correlation with patient history and other diagnostic information is necessary to determine patient infection status. The expected result is Negative.  Fact Sheet for Patients: EntrepreneurPulse.com.au  Fact Sheet for Healthcare Providers: IncredibleEmployment.be  This test is not yet approved or cleared by the [place] FDA and  has been authorized for detection and/or diagnosis of SARS-CoV-2 by FDA under an Emergency Use Authorization (EUA).  This EUA will remain in effect (meaning this te st can be used) for the duration of  the COVID-19 declaration under Section 564(b)(1) of the Act, 21 U.S.C. section 360bbb-3(b)(1), unless the authorization is terminated or revoked sooner.     Influenza A by PCR NEGATIVE  NEGATIVE   Influenza B by PCR NEGATIVE NEGATIVE    Comment: (NOTE) The Xpert Xpress SARS-CoV-2/FLU/RSV plus assay is intended as an aid in the diagnosis of influenza from Nasopharyngeal swab specimens and should not be used as a sole basis for treatment. Nasal washings and aspirates are unacceptable for Xpert Xpress SARS-CoV-2/FLU/RSV testing.  Fact Sheet for Patients: EntrepreneurPulse.com.au  Fact Sheet for Healthcare Providers: IncredibleEmployment.be  This test is not yet approved or cleared by the [place] FDA and has been authorized for detection and/or diagnosis of SARS-CoV-2 by FDA under an Emergency Use Authorization (EUA). This EUA will remain in effect (meaning this test can be used) for the duration of the COVID-19 declaration under Section 564(b)(1) of the Act, 21 U.S.C. section 360bbb-3(b)(1), unless the authorization is terminated or revoked.  Performed at [facility], [place] [address], [place], [place] [address]   Troponin I (High Sensitivity)     Status: Abnormal   Collection Time: 11/15/20 12:40 PM  Result Value Ref Range   Troponin I (High Sensitivity) 389 (HH) <18 ng/L    Comment: DELTA CHECK NOTED CRITICAL RESULT CALLED TO, READ BACK BY AND VERIFIED WITH: DR. Matilde Sprang AT [id number] ON 08.11.22 BY N.THOMPSON (NOTE) Elevated high sensitivity troponin I (hsTnI) values and significant  changes across serial measurements may suggest ACS but many other  chronic and acute conditions are known to elevate hsTnI results.  Refer to the Links section for chest pain algorithms and additional  guidance. Performed at [facility], [place] [address] [place], [place] [address]   Troponin I (High Sensitivity)     Status: Abnormal   Collection Time: 11/15/20  6:45 PM  Result Value Ref Range   Troponin I (High Sensitivity) 746 (HH) <18 ng/L    Comment: DELTA CHECK NOTED CRITICAL RESULT CALLED TO, READ  BACK BY AND VERIFIED WITH: J.Delight Ovens, RN AT 2000 ON 08.11.22 BY N.THOMPSON (NOTE) Elevated high sensitivity troponin I (hsTnI) values and significant  changes across serial measurements may suggest ACS but many other  chronic and acute conditions are known  to elevate hsTnI results.  Refer to the Links section for chest pain algorithms and additional  guidance. Performed at [facility], [place] [address] [place], [place] [address]   Troponin I (High Sensitivity)     Status: Abnormal   Collection Time: 11/15/20  9:30 PM  Result Value Ref Range   Troponin I (High Sensitivity) 835 (HH) <18 ng/L    Comment: CRITICAL VALUE NOTED.  VALUE IS CONSISTENT WITH PREVIOUSLY REPORTED AND CALLED VALUE. DELTA CHECK NOTED (NOTE) Elevated high sensitivity troponin I (hsTnI) values and significant  changes across serial measurements may suggest ACS but many other  chronic and acute conditions are known to elevate hsTnI results.  Refer to the Links section for chest pain algorithms and additional  guidance. Performed at [facility], [place] [address], [place], [place] [address]   Comprehensive metabolic panel     Status: Abnormal   Collection Time: 11/16/20  3:24 AM  Result Value Ref Range   Sodium 140 135 - 145 mmol/L   Potassium 4.4 3.5 - 5.1 mmol/L    Comment: DELTA CHECK NOTED   Chloride 104 98 - 111 mmol/L   CO2 26 22 - 32 mmol/L   Glucose, Bld 144 (H) 70 - 99 mg/dL    Comment: Glucose reference range applies only to samples taken after fasting for at least 8 hours.   BUN 28 (H) 8 - 23 mg/dL   Creatinine, Ser 0.86 0.44 - 1.00 mg/dL   Calcium 9.9 8.9 - 10.3 mg/dL   Total Protein 6.7 6.5 - 8.1 g/dL   Albumin 3.9 3.5 - 5.0 g/dL   AST 24 15 - 41 U/L   ALT 18 0 - 44 U/L   Alkaline Phosphatase 69 38 - 126 U/L   Total Bilirubin 0.5 0.3 - 1.2 mg/dL   GFR, Estimated >60 >60 mL/min    Comment: (NOTE) Calculated using the CKD-EPI Creatinine Equation (2021)     Anion gap 10 5 - 15    Comment: Performed at [facility], [place] [address], [place], [place] [address]  CBC     Status: Abnormal   Collection Time: 11/16/20  3:24 AM  Result Value Ref Range   WBC 16.0 (H) 4.0 - 10.5 K/uL   RBC 4.47 3.87 - 5.11 MIL/uL   Hemoglobin 13.7 12.0 - 15.0 g/dL   HCT 42.9 36.0 - 46.0 %   MCV 96.0 80.0 - 100.0 fL   MCH 30.6 26.0 - 34.0 pg   MCHC 31.9 30.0 - 36.0 g/dL   RDW 14.2 11.5 - 15.5 %   Platelets 365 150 - 400 K/uL   nRBC 0.0 0.0 - 0.2 %    Comment: Performed at [facility], [place] [address], [place], [place] [address]  Magnesium     Status: None   Collection Time: 11/16/20  3:24 AM  Result Value Ref Range   Magnesium 2.3 1.7 - 2.4 mg/dL    Comment: Performed at [facility], [place] [address], [place], [place] [address]  Heparin level (unfractionated)     Status: Abnormal   Collection Time: 11/16/20  3:24 AM  Result Value Ref Range   Heparin Unfractionated 0.28 (L) 0.30 - 0.70 IU/mL    Comment: (NOTE) The clinical reportable range upper limit is being lowered to >1.10 to align with the FDA approved guidance for the current laboratory assay.  If heparin results are below expected values, and patient dosage has  been  confirmed, suggest follow up testing of antithrombin III levels. Performed at [facility], [place] [address], [place], [place] [address]   Heparin level (unfractionated)     Status: None   Collection Time: 11/16/20 10:00 AM  Result Value Ref Range   Heparin Unfractionated 0.31 0.30 - 0.70 IU/mL    Comment: (NOTE) The clinical reportable range upper limit is being lowered to >1.10 to align with the FDA approved guidance for the current laboratory assay.  If heparin results are below expected values, and patient dosage has  been confirmed, suggest follow up testing of antithrombin III levels. Performed at [facility], [place] [address] [place], [place] [address]   D-dimer, quantitative     Status: None   Collection Time: 11/16/20 10:00 AM  Result Value Ref Range   D-Dimer, Quant 0.34 0.00 - 0.50 ug/mL-FEU    Comment: (NOTE) At the manufacturer cut-off value of 0.5 g/mL FEU, this assay has a negative predictive value of 95-100%.This assay is intended for use in conjunction with a clinical pretest probability (PTP) assessment model to exclude pulmonary embolism (PE) and deep venous thrombosis (DVT) in outpatients suspected of PE or DVT. Results should be correlated with clinical presentation. Performed at [facility], [place] [address], [place], [place] [address]   Heparin level (unfractionated)     Status: Abnormal   Collection Time: 11/16/20  9:03 PM  Result Value Ref Range   Heparin Unfractionated 0.14 (L) 0.30 - 0.70 IU/mL    Comment: (NOTE) The clinical reportable range upper limit is being lowered to >1.10 to align with the FDA approved guidance for the current laboratory assay.  If heparin results are below expected values, and patient dosage has  been confirmed, suggest follow up testing of antithrombin III levels. Performed at [facility], [place] [address], [place], [place] [address]   Comprehensive metabolic panel     Status: Abnormal   Collection Time: 11/16/20  9:03 PM  Result Value Ref Range   Sodium 144 135 - 145 mmol/L   Potassium 3.0 (L) 3.5 - 5.1 mmol/L    Comment: DELTA CHECK NOTED   Chloride 114 (H) 98 - 111 mmol/L   CO2 24 22 - 32 mmol/L   Glucose, Bld 85 70 - 99 mg/dL    Comment: Glucose reference range applies only to samples taken after fasting for at least 8 hours.   BUN 27 (H) 8 - 23 mg/dL   Creatinine, Ser 0.77 0.44 - 1.00 mg/dL   Calcium 7.7 (L) 8.9 - 10.3 mg/dL    Comment: DELTA CHECK NOTED   Total Protein 4.9 (L) 6.5 - 8.1 g/dL   Albumin 2.9 (L) 3.5 - 5.0 g/dL   AST 19 15 - 41 U/L   ALT 15 0 - 44 U/L   Alkaline Phosphatase 49 38 - 126  U/L   Total Bilirubin 0.5 0.3 - 1.2 mg/dL   GFR, Estimated >60 >60 mL/min    Comment: (NOTE) Calculated using the CKD-EPI Creatinine Equation (2021)    Anion gap 6 5 - 15    Comment: Performed at [facility], [place] [address] [place], [place] [address]  CBC     Status: Abnormal   Collection Time: 11/17/20  4:00 AM  Result Value Ref Range   WBC 21.6 (H) 4.0 - 10.5 K/uL   RBC 4.48 3.87 - 5.11 MIL/uL   Hemoglobin 13.9 12.0 - 15.0 g/dL   HCT 43.2 36.0 -  46.0 %   MCV 96.4 80.0 - 100.0 fL   MCH 31.0 26.0 - 34.0 pg   MCHC 32.2 30.0 - 36.0 g/dL   RDW 14.2 11.5 - 15.5 %   Platelets 354 150 - 400 K/uL   nRBC 0.0 0.0 - 0.2 %    Comment: Performed at [facility], [place] [address], [place], [place] [id number]  Basic metabolic panel     Status: Abnormal   Collection Time: 11/17/20  4:00 AM  Result Value Ref Range   Sodium 142 135 - 145 mmol/L   Potassium 4.6 3.5 - 5.1 mmol/L    Comment: NO VISIBLE HEMOLYSIS DELTA CHECK NOTED    Chloride 107 98 - 111 mmol/L   CO2 26 22 - 32 mmol/L   Glucose, Bld 135 (H) 70 - 99 mg/dL    Comment: Glucose reference range applies only to samples taken after fasting for at least 8 hours.   BUN 29 (H) 8 - 23 mg/dL   Creatinine, Ser 0.81 0.44 - 1.00 mg/dL   Calcium 9.6 8.9 - 10.3 mg/dL   GFR, Estimated >60 >60 mL/min    Comment: (NOTE) Calculated using the CKD-EPI Creatinine Equation (2021)    Anion gap 9 5 - 15    Comment: Performed at [facility], [place] [address] [place], [place] [address]  Magnesium     Status: None   Collection Time: 11/17/20  4:00 AM  Result Value Ref Range   Magnesium 2.3 1.7 - 2.4 mg/dL    Comment: Performed at [facility], [place] [address], [place], [place] [address]  Phosphorus     Status: None   Collection Time: 11/17/20  4:00 AM  Result Value Ref Range   Phosphorus 4.2 2.5 - 4.6 mg/dL    Comment: Performed at [facility], [place]  [address] [place], [place] [address]  Heparin level (unfractionated)     Status: Abnormal   Collection Time: 11/17/20  4:00 AM  Result Value Ref Range   Heparin Unfractionated 1.01 (H) 0.30 - 0.70 IU/mL    Comment: (NOTE) The clinical reportable range upper limit is being lowered to >1.10 to align with the FDA approved guidance for the current laboratory assay.  If heparin results are below expected values, and patient dosage has  been confirmed, suggest follow up testing of antithrombin III levels. Performed at [facility], [place] [address], [place],  [address]     ECG   N/A  Telemetry   Sinus rhythm- Personally Reviewed  Radiology    DG Chest 2 View  Result Date: 11/15/2020 CLINICAL DATA:  Shortness of breath. EXAM: CHEST - 2 VIEW COMPARISON:  09/22/2020, 09/19/2020 FINDINGS: The heart size and mediastinal contours are within normal limits. Stable chronic lung disease with mild hyperinflation and diffuse interstitial prominence, not significantly changed from prior exams. No superimposed airspace opacity, pleural effusion, or pneumothorax. Redemonstrated compression deformities in the midthoracic spine IMPRESSION: Stable chronic lung disease without superimposed acute process. Electronically Signed   By: Merilyn Baba MD   On: 11/15/2020 11:19    Cardiac Studies   Echo pending  Assessment   Principal Problem:   COPD with acute exacerbation ([place]) Active Problems:   HLD (hyperlipidemia)   Chronic diastolic CHF (congestive heart failure) ([place])   Essential hypertension   Non-ST elevation (NSTEMI) myocardial infarction [facility])   Plan   Ms. Soscia has a new cardiomyopathy around 40% with apical wall motion abnormality  that could be consistent with either Takatsubo or LAD territory ischemia.  Her troponin did trend up.  Given the fact that its been 5 years since her last catheterization, I think a cardiac catheterization definitively is warranted.   She has had intermittent tachycardia and hypoxia and I think a CT is unlikely to be diagnostic.  This was also scheduled as an outpatient.  Will keep her inpatient on IV heparin with the plan for diagnostic left heart catheterization and possible PCI on Monday.  I did discuss the risk, benefits and alternatives with her and she is agreeable to proceed.  The case was also discussed with Dr. Sloan Leiter, the hospital medicine physician. She is again COVID positive by PCR, but not clear if acutely infected or just persistently positive since infection this past May- ho\me AB tests have been negative.  Time Spent Directly with Patient:  I have spent a total of 25 minutes with the patient reviewing hospital notes, telemetry, EKGs, labs and examining the patient as well as establishing an assessment and plan that was discussed personally with the patient.  > 50% of time was spent in direct patient care.  Length of Stay:  LOS: 2 days   Pixie Casino, MD, [facility], [place] Director of the Advanced Lipid Disorders &  Cardiovascular Risk Reduction Clinic Diplomate of the American Board of Clinical Lipidology Attending Cardiologist  Direct Dial: [phone]  Fax: [phone]  Website:  [email or web address] 11/17/2020, 9:59 AM

## 2020-11-17 NOTE — Plan of Care (Signed)

## 2020-11-17 NOTE — Progress Notes (Addendum)
ANTICOAGULATION CONSULT NOTE - follow up  Pharmacy Consult for IV heparin Indication:  NSTEMI  Allergies  Allergen Reactions   Tape Other (See Comments)    SKIN TEARS EASILY!!!!   Ciprofloxacin Other (See Comments)    Hallucinations    Lisinopril Cough   Penicillins Hives and Other (See Comments)    Tolerates Omnicef Has patient had a PCN reaction causing immediate rash, facial/tongue/throat swelling, SOB or lightheadedness with hypotension: Yes Has patient had a PCN reaction causing severe rash involving mucus membranes or skin necrosis: No Has patient had a PCN reaction that required hospitalization No Has patient had a PCN reaction occurring within the last 10 years: No If all of the above answers are "NO", then may proceed with Cephalosporin use.     Patient Measurements: Height: '5\' 2"'$  (157.5 cm) Weight: 59 kg (130 lb) IBW/kg (Calculated) : 50.1 Heparin Dosing Weight: 59 kg  Vital Signs: Temp: 98.2 F (36.8 C) (08/13 1319) Temp Source: Oral (08/13 1319) BP: 127/75 (08/13 1319) Pulse Rate: 86 (08/13 1319)  Labs: Recent Labs    11/15/20 1030 11/15/20 1240 11/15/20 1845 11/15/20 2130 11/16/20 0324 11/16/20 1000 11/16/20 2103 11/17/20 0400 11/17/20 1220  HGB 14.0  --   --   --  13.7  --   --  13.9  --   HCT 44.9  --   --   --  42.9  --   --  43.2  --   PLT 413*  --   --   --  365  --   --  354  --   HEPARINUNFRC  --   --   --   --  0.28*   < > 0.14* 1.01* 0.53  CREATININE 0.90  --   --   --  0.86  --  0.77 0.81  --   TROPONINIHS 74* 389* 746* 835*  --   --   --   --   --    < > = values in this interval not displayed.     Assessment: 65 yo presented to ER with shortness of breath now to start IV heparin for possible NSTEMI. Baseline labs drawn.   11/17/20 3:54 PM  HL therapeutic at 0.53 after heparin drip held x 1 hour and rate decreased to 950 units/hr CBC WNL No bleeding or line interruptions per RN   Goal of Therapy:  Heparin level 0.3-0.7  units/ml Monitor platelets by anticoagulation protocol: Yes   Plan:  Continue heparin 950 unit/hr Check confirmatory heparin level in 6 hours Daily CBC & heparin level Plan for diagnostic left heart catheterization and possible PCI on Monday  Eudelia Bunch, Pharm.D 11/17/2020 3:55 PM   Addendum: Confirmatory heparin level = 0.55 - remains therapeutic on heparin @ 950 units/hr Continue heparin 950 units/hr Daily CBC & heparin level Plan for diagnostic left heart catheterization and possible PCI on Monday  Eudelia Bunch, Pharm.D 11/17/2020 7:28 PM

## 2020-11-17 NOTE — Progress Notes (Signed)
PROGRESS NOTE    Kim [name]  [id number] DOB: 1955-07-03 DOA: 11/15/2020 PCP: [name], NP    Brief Narrative:  65 year old with history of COPD on chronic steroids, alpha-1 antitrypsin carrier, hyperlipidemia, anxiety and depression, persistent COVID-19 positivity and mild nonobstructive coronary artery disease presented to emergency room with intermittent shortness of breath and episodes of tachycardia and hyperventilation since last 4 days.  Multiple rounds of nebulizers helped to improve the symptoms.  Admitted with COPD exacerbation.  Also found to have mildly elevated troponins.   Assessment & Plan:   Principal Problem:   COPD with acute exacerbation ([place]) Active Problems:   HLD (hyperlipidemia)   Chronic diastolic CHF (congestive heart failure) (HCC)   Essential hypertension   Non-ST elevation (NSTEMI) myocardial infarction [facility])  COPD with acute exacerbation: Agree with monitoring in the hospital because of severity of symptoms. Aggressive bronchodilator therapy, IV steroids now transitioned to oral steroids, inhalational steroids, scheduled and as needed bronchodilators, deep breathing exercises, incentive spirometry, chest physiotherapy and respiratory therapy consult. Antibiotics due to severity of symptoms.  Doxycycline for 7 days. Supplemental oxygen to keep saturations more than 92%. D-dimer 0.34.  Negative CT study for pulmonary embolism 2 months ago.  No indication to repeat. Leukocytosis is likely due to high-dose steroids.  Monitor.  Elevated troponins in a patient with mild coronary artery disease: History of Takotsubo cardiomyopathy.  Positional tachycardia. Remains on aspirin.  Currently on heparin infusion.  Initial plan was for CT coronaries. With ongoing symptoms, cardiology deciding about cardiac cath.  Probably will happen on 8/15. Already on statin.  Added low-dose beta-blockers and losartan.  Anxiety and depression: Significant symptoms of  anxiety and panic attacks.  Taking Xanax.  COVID-19 positivity: Persistent positive COVID-19 test with no obvious symptoms.  May be exacerbating current episodes.  No indication for COVID-19 directed therapeutics. Will stay on isolation protocol due to respiratory symptoms.  DVT prophylaxis:   Heparin infusion.   Code Status: Full code Family Communication: None.  Patient talking to her husband. Disposition Plan: Status is: Inpatient  Remains inpatient appropriate because:Inpatient level of care appropriate due to severity of illness  Dispo: The patient is from: Home              Anticipated d/c is to: Home              Patient currently is not medically stable to d/c.   Difficult to place patient No         Consultants:  Cardiology  Procedures:  None  Antimicrobials:  Doxycycline 8/11---   Subjective: Patient seen and examined.  Nervous after talking to cardiologist about cardiac cath.  No other overnight events.  Some dry cough but no other symptoms at rest.  Extremely nervous on mobility.  Objective: Vitals:   11/16/20 2215 11/17/20 0020 11/17/20 0537 11/17/20 0944  BP: 110/72 119/71 (!) 142/80 134/81  Pulse: 86 81 83 90  Resp: (!) '21 18 18 20  '$ Temp:  (!) 97.5 F (36.4 C) 97.6 F (36.4 C) 97.8 F (36.6 C)  TempSrc:  Oral Oral Oral  SpO2: 97% 98% 99% 99%  Weight:      Height:        Intake/Output Summary (Last 24 hours) at 11/17/2020 1038 Last data filed at 11/17/2020 0507 Gross per 24 hour  Intake 518.7 ml  Output --  Net 518.7 ml   Filed Weights   11/15/20 1027  Weight: 59 kg    Examination:  General: Chronically  sick looking, frail on 2 L oxygen.  Fairly comfortable at rest and able to have communication on complete sentences.  Extremely anxious and tachycardic on minimal mobility. Cardiovascular: S1-S2 normal.  Regular rate rhythm.  Tachycardic. Respiratory: Bilateral good air entry.  Has scattered expiratory crackles. Gastrointestinal: Soft  and nontender.  Bowel sounds present. Ext: No swelling or edema.  No cyanosis. Neuro: Alert oriented x4.  No focal neurological deficits.   Data Reviewed: I have personally reviewed following labs and imaging studies  CBC: Recent Labs  Lab 11/15/20 1030 11/16/20 0324 11/17/20 0400  WBC 19.6* 16.0* 21.6*  NEUTROABS 10.0*  --   --   HGB 14.0 13.7 13.9  HCT 44.9 42.9 43.2  MCV 97.6 96.0 96.4  PLT 413* 365 [id number]   Basic Metabolic Panel: Recent Labs  Lab 11/15/20 1030 11/16/20 0324 11/16/20 2103 11/17/20 0400  NA 142 140 144 142  K 3.6 4.4 3.0* 4.6  CL 104 104 114* 107  CO2 '28 26 24 26  '$ GLUCOSE 162* 144* 85 135*  BUN 20 28* 27* 29*  CREATININE 0.90 0.86 0.77 0.81  CALCIUM 9.4 9.9 7.7* 9.6  MG  --  2.3  --  2.3  PHOS  --   --   --  4.2   GFR: Estimated Creatinine Clearance: 54 mL/min (by C-G formula based on SCr of 0.81 mg/dL). Liver Function Tests: Recent Labs  Lab 11/15/20 1030 11/16/20 0324 11/16/20 2103  AST '21 24 19  '$ ALT '18 18 15  '$ ALKPHOS 71 69 49  BILITOT 0.5 0.5 0.5  PROT 6.8 6.7 4.9*  ALBUMIN 4.1 3.9 2.9*   No results for input(s): LIPASE, AMYLASE in the last 168 hours. No results for input(s): AMMONIA in the last 168 hours. Coagulation Profile: No results for input(s): INR, PROTIME in the last 168 hours. Cardiac Enzymes: No results for input(s): CKTOTAL, CKMB, CKMBINDEX, TROPONINI in the last 168 hours. BNP (last 3 results) Recent Labs    05/22/20 1404  PROBNP 147   HbA1C: No results for input(s): HGBA1C in the last 72 hours. CBG: No results for input(s): GLUCAP in the last 168 hours. Lipid Profile: No results for input(s): CHOL, HDL, LDLCALC, TRIG, CHOLHDL, LDLDIRECT in the last 72 hours. Thyroid Function Tests: No results for input(s): TSH, T4TOTAL, FREET4, T3FREE, THYROIDAB in the last 72 hours. Anemia Panel: No results for input(s): VITAMINB12, FOLATE, FERRITIN, TIBC, IRON, RETICCTPCT in the last 72 hours. Sepsis Labs: No results for  input(s): PROCALCITON, LATICACIDVEN in the last 168 hours.  Recent Results (from the past 240 hour(s))  Resp Panel by RT-PCR (Flu A&B, Covid) Nasopharyngeal Swab     Status: Abnormal   Collection Time: 11/15/20 10:33 AM   Specimen: Nasopharyngeal Swab; Nasopharyngeal(NP) swabs in vial transport medium  Result Value Ref Range Status   SARS Coronavirus 2 by RT PCR POSITIVE (A) NEGATIVE Final    Comment: RESULT CALLED TO, READ BACK BY AND VERIFIED WITH: FRANKLIN C. 11/15/2020 @ 1317 BY MECIAL J. (NOTE) SARS-CoV-2 target nucleic acids are DETECTED.  The SARS-CoV-2 RNA is generally detectable in upper respiratory specimens during the acute phase of infection. Positive results are indicative of the presence of the identified virus, but do not rule out bacterial infection or co-infection with other pathogens not detected by the test. Clinical correlation with patient history and other diagnostic information is necessary to determine patient infection status. The expected result is Negative.  Fact Sheet for Patients: EntrepreneurPulse.com.au  Fact Sheet for Healthcare Providers: IncredibleEmployment.be  This test is not yet approved or cleared by the [place] and  has been authorized for detection and/or diagnosis of SARS-CoV-2 by FDA under an Emergency Use Authorization (EUA).  This EUA will remain in effect (meaning this te st can be used) for the duration of  the COVID-19 declaration under Section 564(b)(1) of the Act, 21 U.S.C. section 360bbb-3(b)(1), unless the authorization is terminated or revoked sooner.     Influenza A by PCR NEGATIVE NEGATIVE Final   Influenza B by PCR NEGATIVE NEGATIVE Final    Comment: (NOTE) The Xpert Xpress SARS-CoV-2/FLU/RSV plus assay is intended as an aid in the diagnosis of influenza from Nasopharyngeal swab specimens and should not be used as a sole basis for treatment. Nasal washings and aspirates are  unacceptable for Xpert Xpress SARS-CoV-2/FLU/RSV testing.  Fact Sheet for Patients: EntrepreneurPulse.com.au  Fact Sheet for Healthcare Providers: IncredibleEmployment.be  This test is not yet approved or cleared by the [place] FDA and has been authorized for detection and/or diagnosis of SARS-CoV-2 by FDA under an Emergency Use Authorization (EUA). This EUA will remain in effect (meaning this test can be used) for the duration of the COVID-19 declaration under Section 564(b)(1) of the Act, 21 U.S.C. section 360bbb-3(b)(1), unless the authorization is terminated or revoked.  Performed at [facility], [place] [address], [place], [place] [address]          Radiology Studies: DG Chest 2 View  Result Date: 11/15/2020 CLINICAL DATA:  Shortness of breath. EXAM: CHEST - 2 VIEW COMPARISON:  09/22/2020, 09/19/2020 FINDINGS: The heart size and mediastinal contours are within normal limits. Stable chronic lung disease with mild hyperinflation and diffuse interstitial prominence, not significantly changed from prior exams. No superimposed airspace opacity, pleural effusion, or pneumothorax. Redemonstrated compression deformities in the midthoracic spine IMPRESSION: Stable chronic lung disease without superimposed acute process. Electronically Signed   By: Merilyn Baba MD   On: 11/15/2020 11:19   ECHOCARDIOGRAM COMPLETE  Result Date: 11/17/2020    ECHOCARDIOGRAM REPORT   Patient Name:   Kim [name] Date of Exam: 11/17/2020 Medical Rec #:  [id number]       Height:       62.0 in Accession #:    [id number]      Weight:       130.0 lb Date of Birth:  [birth year]      BSA:          1.592 m Patient Age:    73 years        BP:           142/80 mmHg Patient Gender: F               HR:           83 bpm. Exam Location:  Inpatient Procedure: 2D Echo, Intracardiac Opacification Agent, 3D Echo, Strain Analysis,            Cardiac Doppler and Color  Doppler Indications:    NSTEMI I21.4  History:        Patient has prior history of Echocardiogram examinations, most                 recent 07/11/2019. Previous Myocardial Infarction, COPD; Risk                 Factors:Hypertension, Dyslipidemia and Former Smoker. COVID 19.  Sonographer:    Darlina Sicilian RDCS Referring Phys: [id number] [place]  1. Akinesis of the  entire apex with overall moderate to severe LV dysfunction; no apical thrombus noted using definity; consider LAD infarct vs takotsubo cardiomyopathy.  2. Left ventricular ejection fraction, by estimation, is 30 to 35%. The left ventricle has moderate to severely decreased function. The left ventricle demonstrates regional wall motion abnormalities (see scoring diagram/findings for description). Left ventricular diastolic parameters are consistent with Grade I diastolic dysfunction (impaired relaxation). Elevated left atrial pressure. The average left ventricular global longitudinal strain is -7.9 %. The global longitudinal strain is abnormal.  3. Right ventricular systolic function is normal. The right ventricular size is normal.  4. The mitral valve is normal in structure. No evidence of mitral valve regurgitation. No evidence of mitral stenosis.  5. The aortic valve is tricuspid. Aortic valve regurgitation is mild. Mild aortic valve sclerosis is present, with no evidence of aortic valve stenosis.  6. The inferior vena cava is normal in size with greater than 50% respiratory variability, suggesting right atrial pressure of 3 mmHg. FINDINGS  Left Ventricle: Left ventricular ejection fraction, by estimation, is 30 to 35%. The left ventricle has moderate to severely decreased function. The left ventricle demonstrates regional wall motion abnormalities. Definity contrast agent was given IV to delineate the left ventricular endocardial borders. The average left ventricular global longitudinal strain is -7.9 %. The global longitudinal strain  is abnormal. The left ventricular internal cavity size was normal in size. There is no left ventricular hypertrophy. Left ventricular diastolic parameters are consistent with Grade I diastolic dysfunction (impaired relaxation). Elevated left atrial pressure. Right Ventricle: The right ventricular size is normal. Right ventricular systolic function is normal. Left Atrium: Left atrial size was normal in size. Right Atrium: Right atrial size was normal in size. Pericardium: There is no evidence of pericardial effusion. Mitral Valve: The mitral valve is normal in structure. No evidence of mitral valve regurgitation. No evidence of mitral valve stenosis. Tricuspid Valve: The tricuspid valve is normal in structure. Tricuspid valve regurgitation is not demonstrated. No evidence of tricuspid stenosis. Aortic Valve: The aortic valve is tricuspid. Aortic valve regurgitation is mild. Aortic regurgitation PHT measures 313 msec. Mild aortic valve sclerosis is present, with no evidence of aortic valve stenosis. Pulmonic Valve: The pulmonic valve was not well visualized. Pulmonic valve regurgitation is not visualized. No evidence of pulmonic stenosis. Aorta: The aortic root is normal in size and structure. Venous: The inferior vena cava is normal in size with greater than 50% respiratory variability, suggesting right atrial pressure of 3 mmHg. IAS/Shunts: No atrial level shunt detected by color flow Doppler. Additional Comments: Akinesis of the entire apex with overall moderate to severe LV dysfunction; no apical thrombus noted using definity; consider LAD infarct vs takotsubo cardiomyopathy.  LEFT VENTRICLE PLAX 2D LVIDd:         4.50 cm     Diastology LVIDs:         2.30 cm     LV e' medial:    3.25 cm/s LV PW:         0.70 cm     LV E/e' medial:  19.6 LV IVS:        0.80 cm     LV e' lateral:   4.95 cm/s LVOT diam:     2.00 cm     LV E/e' lateral: 12.9 LV SV:         51 LV SV Index:   32          2D Longitudinal Strain LVOT  Area:  3.14 cm    2D Strain GLS Avg:     -7.9 %  LV Volumes (MOD) LV vol d, MOD A2C: 95.3 ml 3D Volume EF: LV vol d, MOD A4C: 89.5 ml 3D EF:        43 % LV vol s, MOD A2C: 51.4 ml LV EDV:       154 ml LV vol s, MOD A4C: 46.9 ml LV ESV:       87 ml LV SV MOD A2C:     44.0 ml LV SV:        67 ml LV SV MOD A4C:     89.5 ml LV SV MOD BP:      42.7 ml RIGHT VENTRICLE RV S prime:     9.75 cm/s TAPSE (M-mode): 1.5 cm LEFT ATRIUM             Index LA diam:        2.50 cm 1.57 cm/m LA Vol (A2C):   26.6 ml 16.71 ml/m LA Vol (A4C):   25.7 ml 16.14 ml/m LA Biplane Vol: 27.5 ml 17.28 ml/m  AORTIC VALVE LVOT Vmax:   104.00 cm/s LVOT Vmean:  57.500 cm/s LVOT VTI:    0.162 m AI PHT:      313 msec  AORTA Ao Root diam: 3.20 cm Ao Asc diam:  3.20 cm MITRAL VALVE MV Area (PHT): 3.60 cm    SHUNTS MV Decel Time: 211 msec    Systemic VTI:  0.16 m MV E velocity: 63.70 cm/s  Systemic Diam: 2.00 cm MV A velocity: 80.20 cm/s MV E/A ratio:  0.79 Kirk Ruths MD Electronically signed by Kirk Ruths MD Signature Date/Time: 11/17/2020/10:14:44 AM    Final         Scheduled Meds:  aspirin EC  81 mg Oral Daily   doxycycline  100 mg Oral Q12H   guaiFENesin  1,200 mg Oral Q12H   Ipratropium-Albuterol  1 puff Inhalation Q6H   loratadine  10 mg Oral Daily   losartan  75 mg Oral Daily   magnesium oxide  400 mg Oral Daily   mouth rinse  15 mL Mouth Rinse BID   PARoxetine  30 mg Oral Daily   predniSONE  40 mg Oral Q breakfast   rosuvastatin  20 mg Oral Daily   senna  1 tablet Oral BID   Continuous Infusions:  heparin 950 Units/hr (11/17/20 [id number])     LOS: 2 days    Time spent: 25 minutes    Barb Merino, MD [facility] Pager [phone]

## 2020-11-17 NOTE — Plan of Care (Signed)
  Problem: Education: Goal: Knowledge of General Education information will improve Description Including pain rating scale, medication(s)/side effects and non-pharmacologic comfort measures Outcome: Progressing   Problem: Health Behavior/Discharge Planning: Goal: Ability to manage health-related needs will improve Outcome: Progressing   

## 2020-11-17 NOTE — Progress Notes (Signed)
ANTICOAGULATION CONSULT NOTE - follow up  Pharmacy Consult for IV heparin Indication:  NSTEMI  Allergies  Allergen Reactions   Tape Other (See Comments)    SKIN TEARS EASILY!!!!   Ciprofloxacin Other (See Comments)    Hallucinations    Lisinopril Cough   Penicillins Hives and Other (See Comments)    Tolerates Omnicef Has patient had a PCN reaction causing immediate rash, facial/tongue/throat swelling, SOB or lightheadedness with hypotension: Yes Has patient had a PCN reaction causing severe rash involving mucus membranes or skin necrosis: No Has patient had a PCN reaction that required hospitalization No Has patient had a PCN reaction occurring within the last 10 years: No If all of the above answers are "NO", then may proceed with Cephalosporin use.     Patient Measurements: Height: '5\' 2"'$  (157.5 cm) Weight: 59 kg (130 lb) IBW/kg (Calculated) : 50.1 Heparin Dosing Weight: 59 kg  Vital Signs: Temp: 97.5 F (36.4 C) (08/13 0020) Temp Source: Oral (08/13 0020) BP: 119/71 (08/13 0020) Pulse Rate: 81 (08/13 0020)  Labs: Recent Labs    11/15/20 1030 11/15/20 1030 11/15/20 1240 11/15/20 1845 11/15/20 2130 11/16/20 0324 11/16/20 1000 11/16/20 2103 11/17/20 0400  HGB 14.0  --   --   --   --  13.7  --   --  13.9  HCT 44.9  --   --   --   --  42.9  --   --  43.2  PLT 413*  --   --   --   --  365  --   --  354  HEPARINUNFRC  --    < >  --   --   --  0.28* 0.31 0.14* 1.01*  CREATININE 0.90  --   --   --   --  0.86  --  0.77  --   TROPONINIHS 74*  --  389* 746* 835*  --   --   --   --    < > = values in this interval not displayed.     Estimated Creatinine Clearance: 54.7 mL/min (by C-G formula based on SCr of 0.77 mg/dL).   Medical History: 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   OSTEOARTHRITIS 06/05/2009    PARESTHESIA 08/24/2007   TOBACCO ABUSE 01/25/2010    Medications:  Scheduled:   aspirin EC  81 mg Oral Daily   doxycycline  100 mg Oral Q12H   guaiFENesin  1,200 mg Oral Q12H   Ipratropium-Albuterol  1 puff Inhalation Q6H   loratadine  10 mg Oral Daily   losartan  75 mg Oral Daily   magnesium oxide  400 mg Oral Daily   mouth rinse  15 mL Mouth Rinse BID   PARoxetine  30 mg Oral Daily   predniSONE  40 mg Oral Q breakfast   rosuvastatin  20 mg Oral Daily   senna  1 tablet Oral BID   Infusions:   heparin 1,050 Units/hr (11/16/20 2150)   PRN: [DISCONTINUED] acetaminophen **OR** acetaminophen, albuterol, ALPRAZolam, bisacodyl, HYDROcodone-acetaminophen, metoprolol tartrate, ondansetron **OR** ondansetron (ZOFRAN) IV, polyethylene glycol  Assessment: 65 yo presented to ER with shortness of breath now to start IV heparin for possible NSTEMI. Baseline labs drawn.   11/17/20 5:00 AM  HL 1.01 supra-therapeutic on 1050 units/hr CBC WNL No bleeding or line interruptions per RN  Goal of Therapy:  Heparin level 0.3-0.7 units/ml Monitor platelets by anticoagulation protocol: Yes   Plan:  Hold heparin drip x 1 hour then resume at 950 units/hr Check heparin level in 6 hours Daily CBC F/u plans for coronary CTA  Amiya Tessendorf RPh 11/17/2020, 5:04 AM

## 2020-11-17 NOTE — TOC Progression Note (Signed)
Transition of Care [facility]) - Progression Note    Patient Details  Name: Kim [name] MRN: [id number] Date of Birth: May 27, 1955  [facility]) CM/SW Contact  Purcell Mouton, RN Phone Number: 11/17/2020, 9:55 AM  Clinical Narrative:    Pt from home with spouse. TOC will continue to follow.   Expected Discharge Plan: Home/Self Care Barriers to Discharge: No Barriers Identified  Expected Discharge Plan and Services Expected Discharge Plan: Home/Self Care       Living arrangements for the past 2 months: Single Family Home                                       Social Determinants of Health (SDOH) Interventions    Readmission Risk Interventions No flowsheet data found.

## 2020-11-18 DIAGNOSIS — I5043 Acute on chronic combined systolic (congestive) and diastolic (congestive) heart failure: Secondary | ICD-10-CM | POA: Diagnosis not present

## 2020-11-18 DIAGNOSIS — J441 Chronic obstructive pulmonary disease with (acute) exacerbation: Secondary | ICD-10-CM | POA: Diagnosis not present

## 2020-11-18 DIAGNOSIS — I10 Essential (primary) hypertension: Secondary | ICD-10-CM | POA: Diagnosis not present

## 2020-11-18 LAB — CBC
HCT: 41.4 % (ref 36.0–46.0)
Hemoglobin: 13.4 g/dL (ref 12.0–15.0)
MCH: 31.4 pg (ref 26.0–34.0)
MCHC: 32.4 g/dL (ref 30.0–36.0)
MCV: 97 fL (ref 80.0–100.0)
Platelets: 348 10*3/uL (ref 150–400)
RBC: 4.27 MIL/uL (ref 3.87–5.11)
RDW: 14.1 % (ref 11.5–15.5)
WBC: 15.4 10*3/uL — ABNORMAL HIGH (ref 4.0–10.5)
nRBC: 0 % (ref 0.0–0.2)

## 2020-11-18 LAB — HEPARIN LEVEL (UNFRACTIONATED)
Heparin Unfractionated: 0.75 IU/mL — ABNORMAL HIGH (ref 0.30–0.70)
Heparin Unfractionated: 0.51 IU/mL (ref 0.30–0.70)

## 2020-11-18 MED ORDER — HEPARIN (PORCINE) 25000 UT/250ML-% IV SOLN
900.0000 [IU]/h | INTRAVENOUS | Status: DC
  Administered 2020-11-19: 900 [IU]/h via INTRAVENOUS
  Filled 2020-11-18 (×2): qty 250

## 2020-11-18 MED ORDER — SODIUM CHLORIDE 0.9 % IV SOLN: 250.0000 mL | INTRAVENOUS | Status: DC | PRN

## 2020-11-18 MED ORDER — FUROSEMIDE 10 MG/ML IJ SOLN
40.0000 mg | Freq: Once | INTRAMUSCULAR | Status: AC
  Administered 2020-11-18: 40 mg via INTRAVENOUS
  Filled 2020-11-18: qty 4

## 2020-11-18 MED ORDER — SODIUM CHLORIDE 0.9% FLUSH: 3.0000 mL | INTRAVENOUS | Status: DC | PRN

## 2020-11-18 MED ORDER — SODIUM CHLORIDE 0.9% FLUSH
3.0000 mL | Freq: Two times a day (BID) | INTRAVENOUS | Status: DC
  Administered 2020-11-18: 3 mL via INTRAVENOUS

## 2020-11-18 MED ORDER — SODIUM CHLORIDE 0.9 % IV SOLN: INTRAVENOUS | Status: DC

## 2020-11-18 NOTE — Plan of Care (Signed)
  Problem: Clinical Measurements: Goal: Diagnostic test results will improve Outcome: Progressing   

## 2020-11-18 NOTE — Progress Notes (Signed)
DAILY PROGRESS NOTE   Patient Name: Kim [name] Date of Encounter: 11/18/2020 Cardiologist: Lauree Chandler, MD  Chief Complaint   Shortness of breath  Patient Profile   Kim [name] is a 65 y.o. female with a history of mild non-obstructive CAD on cardiac catheterization in 2017, non-ischemic cardiomyopathy (possibly stress induced cardiomyopathy)/ chronic combined CHF with EF as low as 25-35% in 2017 but improved to 60-65% in 07/2019, COPD on 2L of O2 with ambulation followed by Pulmonology, hypertension, hyperlipidemia, and prior tobacco abuse (quit in 2015) who is being seen for the evaluation of elevated troponin at the request of Dr. Thereasa Solo.  Subjective   No issues overnight - She is net positive, given LVEF 30-35% (I think it is a little higher than than) - will begin diuresis. We had discussed LHC given regionality of wall motion - again, could be Takatsubo vs LAD territory ischemia. She is agreeable to proceed. Although COVID PCR is positive, not though that she has re-infection - her home antibody testing has been negative and she had COVID infection in 08/2020.  Objective   Vitals:   11/17/20 0944 11/17/20 1319 11/17/20 2120 11/18/20 0345  BP: 134/81 127/75 123/64 121/83  Pulse: 90 86 85 67  Resp: '20 20 20 18  '$ Temp: 97.8 F (36.6 C) 98.2 F (36.8 C) 98.2 F (36.8 C) 98 F (36.7 C)  TempSrc: Oral Oral Oral Oral  SpO2: 99% 97% 97% 98%  Weight:      Height:        Intake/Output Summary (Last 24 hours) at 11/18/2020 [id number] Last data filed at 11/18/2020 0600 Gross per 24 hour  Intake 942.79 ml  Output --  Net 942.79 ml   Filed Weights   11/15/20 1027  Weight: 59 kg    Physical Exam   General appearance: alert, no distress, and mildly obese Neck: no carotid bruit, no JVD, and thyroid not enlarged, symmetric, no tenderness/mass/nodules Lungs: diminished breath sounds bilaterally and rhonchi bilaterally Heart: regular rate and rhythm Abdomen: soft,  non-tender; bowel sounds normal; no masses,  no organomegaly Extremities: extremities normal, atraumatic, no cyanosis or edema Pulses: 2+ and symmetric Skin: pale, cool, dry Neurologic: Grossly normal Psych: Pleasant  Inpatient Medications    Scheduled Meds:  aspirin EC  81 mg Oral Daily   doxycycline  100 mg Oral Q12H   guaiFENesin  1,200 mg Oral Q12H   Ipratropium-Albuterol  1 puff Inhalation Q6H   loratadine  10 mg Oral Daily   losartan  75 mg Oral Daily   magnesium oxide  400 mg Oral Daily   mouth rinse  15 mL Mouth Rinse BID   PARoxetine  30 mg Oral Daily   predniSONE  40 mg Oral Q breakfast   rosuvastatin  20 mg Oral Daily   senna  1 tablet Oral BID    Continuous Infusions:  heparin 950 Units/hr (11/17/20 2056)    PRN Meds: [DISCONTINUED] acetaminophen **OR** acetaminophen, albuterol, ALPRAZolam, bisacodyl, HYDROcodone-acetaminophen, metoprolol tartrate, ondansetron **OR** ondansetron (ZOFRAN) IV, polyethylene glycol   Labs   Results for orders placed or performed during the hospital encounter of 11/15/20 (from the past 48 hour(s))  Heparin level (unfractionated)     Status: None   Collection Time: 11/16/20 10:00 AM  Result Value Ref Range   Heparin Unfractionated 0.31 0.30 - 0.70 IU/mL    Comment: (NOTE) The clinical reportable range upper limit is being lowered to >1.10 to align with the FDA approved guidance for the current  laboratory assay.  If heparin results are below expected values, and patient dosage has  been confirmed, suggest follow up testing of antithrombin III levels. Performed at [facility], [place] [address], [place], [place] [address]   D-dimer, quantitative     Status: None   Collection Time: 11/16/20 10:00 AM  Result Value Ref Range   D-Dimer, Quant 0.34 0.00 - 0.50 ug/mL-FEU    Comment: (NOTE) At the manufacturer cut-off value of 0.5 g/mL FEU, this assay has a negative predictive value of 95-100%.This assay is  intended for use in conjunction with a clinical pretest probability (PTP) assessment model to exclude pulmonary embolism (PE) and deep venous thrombosis (DVT) in outpatients suspected of PE or DVT. Results should be correlated with clinical presentation. Performed at [facility], [place] [address] [place], [place] [address]   Heparin level (unfractionated)     Status: Abnormal   Collection Time: 11/16/20  9:03 PM  Result Value Ref Range   Heparin Unfractionated 0.14 (L) 0.30 - 0.70 IU/mL    Comment: (NOTE) The clinical reportable range upper limit is being lowered to >1.10 to align with the FDA approved guidance for the current laboratory assay.  If heparin results are below expected values, and patient dosage has  been confirmed, suggest follow up testing of antithrombin III levels. Performed at [facility], [place] [address] [place], [place] [address]   Comprehensive metabolic panel     Status: Abnormal   Collection Time: 11/16/20  9:03 PM  Result Value Ref Range   Sodium 144 135 - 145 mmol/L   Potassium 3.0 (L) 3.5 - 5.1 mmol/L    Comment: DELTA CHECK NOTED   Chloride 114 (H) 98 - 111 mmol/L   CO2 24 22 - 32 mmol/L   Glucose, Bld 85 70 - 99 mg/dL    Comment: Glucose reference range applies only to samples taken after fasting for at least 8 hours.   BUN 27 (H) 8 - 23 mg/dL   Creatinine, Ser 0.77 0.44 - 1.00 mg/dL   Calcium 7.7 (L) 8.9 - 10.3 mg/dL    Comment: DELTA CHECK NOTED   Total Protein 4.9 (L) 6.5 - 8.1 g/dL   Albumin 2.9 (L) 3.5 - 5.0 g/dL   AST 19 15 - 41 U/L   ALT 15 0 - 44 U/L   Alkaline Phosphatase 49 38 - 126 U/L   Total Bilirubin 0.5 0.3 - 1.2 mg/dL   GFR, Estimated >60 >60 mL/min    Comment: (NOTE) Calculated using the CKD-EPI Creatinine Equation (2021)    Anion gap 6 5 - 15    Comment: Performed at [facility], [place] [address] [place], [place] [address]  CBC     Status: Abnormal   Collection  Time: 11/17/20  4:00 AM  Result Value Ref Range   WBC 21.6 (H) 4.0 - 10.5 K/uL   RBC 4.48 3.87 - 5.11 MIL/uL   Hemoglobin 13.9 12.0 - 15.0 g/dL   HCT 43.2 36.0 - 46.0 %   MCV 96.4 80.0 - 100.0 fL   MCH 31.0 26.0 - 34.0 pg   MCHC 32.2 30.0 - 36.0 g/dL   RDW 14.2 11.5 - 15.5 %   Platelets 354 150 - 400 K/uL   nRBC 0.0 0.0 - 0.2 %    Comment: Performed at [facility], [place] [address], [place], [place] [id number]  Basic metabolic panel     Status: Abnormal  Collection Time: 11/17/20  4:00 AM  Result Value Ref Range   Sodium 142 135 - 145 mmol/L   Potassium 4.6 3.5 - 5.1 mmol/L    Comment: NO VISIBLE HEMOLYSIS DELTA CHECK NOTED    Chloride 107 98 - 111 mmol/L   CO2 26 22 - 32 mmol/L   Glucose, Bld 135 (H) 70 - 99 mg/dL    Comment: Glucose reference range applies only to samples taken after fasting for at least 8 hours.   BUN 29 (H) 8 - 23 mg/dL   Creatinine, Ser 0.81 0.44 - 1.00 mg/dL   Calcium 9.6 8.9 - 10.3 mg/dL   GFR, Estimated >60 >60 mL/min    Comment: (NOTE) Calculated using the CKD-EPI Creatinine Equation (2021)    Anion gap 9 5 - 15    Comment: Performed at [facility], [place] [address], [place], [place] [address]  Magnesium     Status: None   Collection Time: 11/17/20  4:00 AM  Result Value Ref Range   Magnesium 2.3 1.7 - 2.4 mg/dL    Comment: Performed at [facility], [place] [address], [place], [place] [address]  Phosphorus     Status: None   Collection Time: 11/17/20  4:00 AM  Result Value Ref Range   Phosphorus 4.2 2.5 - 4.6 mg/dL    Comment: Performed at [facility], [place] [address], [place], [place] [address]  Heparin level (unfractionated)     Status: Abnormal   Collection Time: 11/17/20  4:00 AM  Result Value Ref Range   Heparin Unfractionated 1.01 (H) 0.30 - 0.70 IU/mL    Comment: (NOTE) The clinical reportable range upper limit is being lowered to >1.10 to align with the FDA  approved guidance for the current laboratory assay.  If heparin results are below expected values, and patient dosage has  been confirmed, suggest follow up testing of antithrombin III levels. Performed at [facility], [place] [address], [place], [place] [address]   Heparin level (unfractionated)     Status: None   Collection Time: 11/17/20 12:20 PM  Result Value Ref Range   Heparin Unfractionated 0.53 0.30 - 0.70 IU/mL    Comment: (NOTE) The clinical reportable range upper limit is being lowered to >1.10 to align with the FDA approved guidance for the current laboratory assay.  If heparin results are below expected values, and patient dosage has  been confirmed, suggest follow up testing of antithrombin III levels. Performed at [facility], [place] [address], [place], [place] [address]   Heparin level (unfractionated)     Status: None   Collection Time: 11/17/20  6:50 PM  Result Value Ref Range   Heparin Unfractionated 0.55 0.30 - 0.70 IU/mL    Comment: (NOTE) The clinical reportable range upper limit is being lowered to >1.10 to align with the FDA approved guidance for the current laboratory assay.  If heparin results are below expected values, and patient dosage has  been confirmed, suggest follow up testing of antithrombin III levels. Performed at [facility], [place] [address] [place], [place] [address]   CBC     Status: Abnormal   Collection Time: 11/18/20  6:46 AM  Result Value Ref Range   WBC 15.4 (H) 4.0 - 10.5 K/uL   RBC 4.27 3.87 - 5.11 MIL/uL   Hemoglobin 13.4 12.0 - 15.0 g/dL   HCT 41.4 36.0 - 46.0 %   MCV 97.0 80.0 - 100.0 fL  MCH 31.4 26.0 - 34.0 pg   MCHC 32.4 30.0 - 36.0 g/dL   RDW 14.1 11.5 - 15.5 %   Platelets 348 150 - 400 K/uL   nRBC 0.0 0.0 - 0.2 %    Comment: Performed at [facility], [place] [address] [place], [place] [address]  Heparin level (unfractionated)     Status:  Abnormal   Collection Time: 11/18/20  6:46 AM  Result Value Ref Range   Heparin Unfractionated 0.75 (H) 0.30 - 0.70 IU/mL    Comment: (NOTE) The clinical reportable range upper limit is being lowered to >1.10 to align with the FDA approved guidance for the current laboratory assay.  If heparin results are below expected values, and patient dosage has  been confirmed, suggest follow up testing of antithrombin III levels. Performed at [facility], [place] [address] [place], [place] [address]     ECG   N/A  Telemetry   Sinus rhythm- Personally Reviewed  Radiology    ECHOCARDIOGRAM COMPLETE  Result Date: 11/17/2020    ECHOCARDIOGRAM REPORT   Patient Name:   CATOYA FEMRITE Date of Exam: 11/17/2020 Medical Rec #:  [id number]       Height:       62.0 in Accession #:    [id number]      Weight:       130.0 lb Date of Birth:  09/14/1955      BSA:          1.592 m Patient Age:    66 years        BP:           142/80 mmHg Patient Gender: F               HR:           83 bpm. Exam Location:  Inpatient Procedure: 2D Echo, Intracardiac Opacification Agent, 3D Echo, Strain Analysis,            Cardiac Doppler and Color Doppler Indications:    NSTEMI I21.4  History:        Patient has prior history of Echocardiogram examinations, most                 recent 07/11/2019. Previous Myocardial Infarction, COPD; Risk                 Factors:Hypertension, Dyslipidemia and Former Smoker. COVID 19.  Sonographer:    Darlina Sicilian RDCS Referring Phys: [id number] [place]  1. Akinesis of the entire apex with overall moderate to severe LV dysfunction; no apical thrombus noted using definity; consider LAD infarct vs takotsubo cardiomyopathy.  2. Left ventricular ejection fraction, by estimation, is 30 to 35%. The left ventricle has moderate to severely decreased function. The left ventricle demonstrates regional wall motion abnormalities (see scoring diagram/findings for description).  Left ventricular diastolic parameters are consistent with Grade I diastolic dysfunction (impaired relaxation). Elevated left atrial pressure. The average left ventricular global longitudinal strain is -7.9 %. The global longitudinal strain is abnormal.  3. Right ventricular systolic function is normal. The right ventricular size is normal.  4. The mitral valve is normal in structure. No evidence of mitral valve regurgitation. No evidence of mitral stenosis.  5. The aortic valve is tricuspid. Aortic valve regurgitation is mild. Mild aortic valve sclerosis is present, with no evidence of aortic valve stenosis.  6. The inferior vena cava is normal in size with greater than 50% respiratory variability,  suggesting right atrial pressure of 3 mmHg. FINDINGS  Left Ventricle: Left ventricular ejection fraction, by estimation, is 30 to 35%. The left ventricle has moderate to severely decreased function. The left ventricle demonstrates regional wall motion abnormalities. Definity contrast agent was given IV to delineate the left ventricular endocardial borders. The average left ventricular global longitudinal strain is -7.9 %. The global longitudinal strain is abnormal. The left ventricular internal cavity size was normal in size. There is no left ventricular hypertrophy. Left ventricular diastolic parameters are consistent with Grade I diastolic dysfunction (impaired relaxation). Elevated left atrial pressure. Right Ventricle: The right ventricular size is normal. Right ventricular systolic function is normal. Left Atrium: Left atrial size was normal in size. Right Atrium: Right atrial size was normal in size. Pericardium: There is no evidence of pericardial effusion. Mitral Valve: The mitral valve is normal in structure. No evidence of mitral valve regurgitation. No evidence of mitral valve stenosis. Tricuspid Valve: The tricuspid valve is normal in structure. Tricuspid valve regurgitation is not demonstrated. No evidence of  tricuspid stenosis. Aortic Valve: The aortic valve is tricuspid. Aortic valve regurgitation is mild. Aortic regurgitation PHT measures 313 msec. Mild aortic valve sclerosis is present, with no evidence of aortic valve stenosis. Pulmonic Valve: The pulmonic valve was not well visualized. Pulmonic valve regurgitation is not visualized. No evidence of pulmonic stenosis. Aorta: The aortic root is normal in size and structure. Venous: The inferior vena cava is normal in size with greater than 50% respiratory variability, suggesting right atrial pressure of 3 mmHg. IAS/Shunts: No atrial level shunt detected by color flow Doppler. Additional Comments: Akinesis of the entire apex with overall moderate to severe LV dysfunction; no apical thrombus noted using definity; consider LAD infarct vs takotsubo cardiomyopathy.  LEFT VENTRICLE PLAX 2D LVIDd:         4.50 cm     Diastology LVIDs:         2.30 cm     LV e' medial:    3.25 cm/s LV PW:         0.70 cm     LV E/e' medial:  19.6 LV IVS:        0.80 cm     LV e' lateral:   4.95 cm/s LVOT diam:     2.00 cm     LV E/e' lateral: 12.9 LV SV:         51 LV SV Index:   32          2D Longitudinal Strain LVOT Area:     3.14 cm    2D Strain GLS Avg:     -7.9 %  LV Volumes (MOD) LV vol d, MOD A2C: 95.3 ml 3D Volume EF: LV vol d, MOD A4C: 89.5 ml 3D EF:        43 % LV vol s, MOD A2C: 51.4 ml LV EDV:       154 ml LV vol s, MOD A4C: 46.9 ml LV ESV:       87 ml LV SV MOD A2C:     44.0 ml LV SV:        67 ml LV SV MOD A4C:     89.5 ml LV SV MOD BP:      42.7 ml RIGHT VENTRICLE RV S prime:     9.75 cm/s TAPSE (M-mode): 1.5 cm LEFT ATRIUM             Index LA diam:  2.50 cm 1.57 cm/m LA Vol (A2C):   26.6 ml 16.71 ml/m LA Vol (A4C):   25.7 ml 16.14 ml/m LA Biplane Vol: 27.5 ml 17.28 ml/m  AORTIC VALVE LVOT Vmax:   104.00 cm/s LVOT Vmean:  57.500 cm/s LVOT VTI:    0.162 m AI PHT:      313 msec  AORTA Ao Root diam: 3.20 cm Ao Asc diam:  3.20 cm MITRAL VALVE MV Area (PHT): 3.60 cm     SHUNTS MV Decel Time: 211 msec    Systemic VTI:  0.16 m MV E velocity: 63.70 cm/s  Systemic Diam: 2.00 cm MV A velocity: 80.20 cm/s MV E/A ratio:  0.79 Kirk Ruths MD Electronically signed by Kirk Ruths MD Signature Date/Time: 11/17/2020/10:14:44 AM    Final     Cardiac Studies   Echo pending  Assessment   Principal Problem:   COPD with acute exacerbation ([place]) Active Problems:   HLD (hyperlipidemia)   Chronic diastolic CHF (congestive heart failure) (HCC)   Essential hypertension   Non-ST elevation (NSTEMI) myocardial infarction [facility])   Plan   Ms. Masri will need further work-up of her cardiomyopathy - could very well be Takatsubo's, however, had some minimal CAD by cath in 2017 and could have had progression of disease. Will arrange for [facility] hopefully tomorrow -orders placed. Although she is COVID + by PCR, not clear if she has acute disease - had COVID in 08/2020 - antibody tests at home were negative. Will give 40 mg IV lasix today x 1 - monitor output daily and weights.  Time Spent Directly with Patient:  I have spent a total of 25 minutes with the patient reviewing hospital notes, telemetry, EKGs, labs and examining the patient as well as establishing an assessment and plan that was discussed personally with the patient.  > 50% of time was spent in direct patient care.  Length of Stay:  LOS: 3 days   Pixie Casino, MD, [facility], [place] Director of the Advanced Lipid Disorders &  Cardiovascular Risk Reduction Clinic Diplomate of the American Board of Clinical Lipidology Attending Cardiologist  Direct Dial: [phone]  Fax: [phone]  Website:  [email or web address] Lindell 11/18/2020, 9:06 AM

## 2020-11-18 NOTE — Progress Notes (Addendum)
ANTICOAGULATION CONSULT NOTE - follow up  Pharmacy Consult for IV heparin Indication:  NSTEMI  Allergies  Allergen Reactions   Tape Other (See Comments)    SKIN TEARS EASILY!!!!   Ciprofloxacin Other (See Comments)    Hallucinations    Lisinopril Cough   Penicillins Hives and Other (See Comments)    Tolerates Omnicef Has patient had a PCN reaction causing immediate rash, facial/tongue/throat swelling, SOB or lightheadedness with hypotension: Yes Has patient had a PCN reaction causing severe rash involving mucus membranes or skin necrosis: No Has patient had a PCN reaction that required hospitalization No Has patient had a PCN reaction occurring within the last 10 years: No If all of the above answers are "NO", then may proceed with Cephalosporin use.     Patient Measurements: Height: '5\' 2"'$  (157.5 cm) Weight: 59 kg (130 lb) IBW/kg (Calculated) : 50.1 Heparin Dosing Weight: 59 kg  Vital Signs: Temp: 98 F (36.7 C) (08/14 0345) Temp Source: Oral (08/14 0345) BP: 121/83 (08/14 0345) Pulse Rate: 67 (08/14 0345)  Labs: Recent Labs    11/15/20 1240 11/15/20 1845 11/15/20 2130 11/16/20 0324 11/16/20 1000 11/16/20 2103 11/17/20 0400 11/17/20 1220 11/17/20 1850 11/18/20 0646  HGB  --   --   --  13.7  --   --  13.9  --   --  13.4  HCT  --   --   --  42.9  --   --  43.2  --   --  41.4  PLT  --   --   --  365  --   --  354  --   --  348  HEPARINUNFRC  --   --   --  0.28*   < > 0.14* 1.01* 0.53 0.55 0.75*  CREATININE  --   --   --  0.86  --  0.77 0.81  --   --   --   TROPONINIHS 389* 746* 835*  --   --   --   --   --   --   --    < > = values in this interval not displayed.     Assessment: 65 yo presented to ER with shortness of breath now to start IV heparin for possible NSTEMI. Baseline labs drawn.   11/18/20 9:07 AM  Heparin level slightly supra-therapeutic at 0.75 on 950 units/hr CBC WNL No bleeding or line interruptions per RN   Goal of Therapy:  Heparin  level 0.3-0.7 units/ml Monitor platelets by anticoagulation protocol: Yes   Plan:  decrease heparin to 900 unit/hr Check heparin level in 6 hours Daily CBC & heparin level Plan for diagnostic left heart catheterization and possible PCI on Monday  Eudelia Bunch, Pharm.D 11/18/2020 9:07 AM  Addendum: Heparin level 0.51 = therapeutic after heparin rate decreased to 900 units/hr.  No bleeding reported.  Plan:  Continue heparin at 900 units/hr Daily CBC/HL Cardiac cath, possible PCI tomorrow  Eudelia Bunch, Pharm.D 11/18/2020 6:27 PM

## 2020-11-18 NOTE — Progress Notes (Addendum)
PROGRESS NOTE    Kim [name]  [id number] DOB: 05-Dec-1955 DOA: 11/15/2020 PCP: [name], NP    Brief Narrative:  65 year old with history of COPD on chronic steroids, alpha-1 antitrypsin carrier, hyperlipidemia, anxiety and depression, persistent COVID-19 positivity and mild nonobstructive coronary artery disease presented to emergency room with intermittent shortness of breath and episodes of tachycardia and hyperventilation since last 4 days.  Multiple rounds of nebulizers helped to improve the symptoms.  Admitted with COPD exacerbation.  Also found to have mildly elevated troponins.   Assessment & Plan:   Principal Problem:   COPD with acute exacerbation ([place]) Active Problems:   HLD (hyperlipidemia)   Chronic diastolic CHF (congestive heart failure) (HCC)   Essential hypertension   Non-ST elevation (NSTEMI) myocardial infarction [facility])  COPD with acute exacerbation: Agree with monitoring in the hospital because of severity of symptoms. Aggressive bronchodilator therapy, IV steroids now transitioned to oral steroids, inhalational steroids, scheduled and as needed bronchodilators, deep breathing exercises, incentive spirometry, chest physiotherapy and respiratory therapy consult. Antibiotics due to severity of symptoms.  Doxycycline for 7 days. Supplemental oxygen to keep saturations more than 92%. D-dimer 0.34.  Negative CT study for pulmonary embolism 2 months ago.  No indication to repeat. 8/14 reports feeling much better than on admission. Leukocytosis improving, due to high-dose steroids  continue to monitor Continue inhalers   Elevated troponins in a patient with mild coronary artery disease: History of Takotsubo cardiomyopathy.  8/14 cardiology following.  Plan for [facility] tomorrow.  Was given Lasix 40 mg x 1 yesterday. Currently without chest pain Continue heparin drip  Anxiety and depression:  Significant symptoms of anxiety and panic attacks.  8/14 continue  Xanax   COVID-19 positivity: Persistent positive COVID-19 test with no obvious symptoms.  May be exacerbating current episodes.  No indication for COVID-19 directed therapeutics. 8/14 continue airborne and contact precautions Will stay on isolation protocol due to respiratory symptoms.  DVT prophylaxis:   Heparin infusion.   Code Status: Full code Family Communication: Husband at bedside   Disposition Plan: Status is: Inpatient  Remains inpatient appropriate because:Inpatient level of care appropriate due to severity of illness  Dispo: The patient is from: Home              Anticipated d/c is to: Home              Patient currently is not medically stable to d/c.   Difficult to place patient No   Patient awaiting cardiac cath in a.m.      Consultants:  Cardiology  Procedures:  None  Antimicrobials:  Doxycycline 8/11---   Subjective: Shortness of breath improving, no chest pain today.  Objective: Vitals:   11/17/20 0944 11/17/20 1319 11/17/20 2120 11/18/20 0345  BP: 134/81 127/75 123/64 121/83  Pulse: 90 86 85 67  Resp: '20 20 20 18  '$ Temp: 97.8 F (36.6 C) 98.2 F (36.8 C) 98.2 F (36.8 C) 98 F (36.7 C)  TempSrc: Oral Oral Oral Oral  SpO2: 99% 97% 97% 98%  Weight:      Height:        Intake/Output Summary (Last 24 hours) at 11/18/2020 0757 Last data filed at 11/18/2020 0600 Gross per 24 hour  Intake 942.79 ml  Output --  Net 942.79 ml   Filed Weights   11/15/20 1027  Weight: 59 kg    Examination:  NAD, calm Increased expiratory time, no wheezing Regular S1-S2 no gallops Soft benign positive bowel sounds No edema AA  xox4, grossly intact   Data Reviewed: I have personally reviewed following labs and imaging studies  CBC: Recent Labs  Lab 11/15/20 1030 11/16/20 0324 11/17/20 0400 11/18/20 0646  WBC 19.6* 16.0* 21.6* 15.4*  NEUTROABS 10.0*  --   --   --   HGB 14.0 13.7 13.9 13.4  HCT 44.9 42.9 43.2 41.4  MCV 97.6 96.0 96.4 97.0   PLT 413* 365 354 0000000   Basic Metabolic Panel: Recent Labs  Lab 11/15/20 1030 11/16/20 0324 11/16/20 2103 11/17/20 0400  NA 142 140 144 142  K 3.6 4.4 3.0* 4.6  CL 104 104 114* 107  CO2 '28 26 24 26  '$ GLUCOSE 162* 144* 85 135*  BUN 20 28* 27* 29*  CREATININE 0.90 0.86 0.77 0.81  CALCIUM 9.4 9.9 7.7* 9.6  MG  --  2.3  --  2.3  PHOS  --   --   --  4.2   GFR: Estimated Creatinine Clearance: 54 mL/min (by C-G formula based on SCr of 0.81 mg/dL). Liver Function Tests: Recent Labs  Lab 11/15/20 1030 11/16/20 0324 11/16/20 2103  AST '21 24 19  '$ ALT '18 18 15  '$ ALKPHOS 71 69 49  BILITOT 0.5 0.5 0.5  PROT 6.8 6.7 4.9*  ALBUMIN 4.1 3.9 2.9*   No results for input(s): LIPASE, AMYLASE in the last 168 hours. No results for input(s): AMMONIA in the last 168 hours. Coagulation Profile: No results for input(s): INR, PROTIME in the last 168 hours. Cardiac Enzymes: No results for input(s): CKTOTAL, CKMB, CKMBINDEX, TROPONINI in the last 168 hours. BNP (last 3 results) Recent Labs    05/22/20 1404  PROBNP 147   HbA1C: No results for input(s): HGBA1C in the last 72 hours. CBG: No results for input(s): GLUCAP in the last 168 hours. Lipid Profile: No results for input(s): CHOL, HDL, LDLCALC, TRIG, CHOLHDL, LDLDIRECT in the last 72 hours. Thyroid Function Tests: No results for input(s): TSH, T4TOTAL, FREET4, T3FREE, THYROIDAB in the last 72 hours. Anemia Panel: No results for input(s): VITAMINB12, FOLATE, FERRITIN, TIBC, IRON, RETICCTPCT in the last 72 hours. Sepsis Labs: No results for input(s): PROCALCITON, LATICACIDVEN in the last 168 hours.  Recent Results (from the past 240 hour(s))  Resp Panel by RT-PCR (Flu A&B, Covid) Nasopharyngeal Swab     Status: Abnormal   Collection Time: 11/15/20 10:33 AM   Specimen: Nasopharyngeal Swab; Nasopharyngeal(NP) swabs in vial transport medium  Result Value Ref Range Status   SARS Coronavirus 2 by RT PCR POSITIVE (A) NEGATIVE Final     Comment: RESULT CALLED TO, READ BACK BY AND VERIFIED WITH: FRANKLIN C. 11/15/2020 @ 1317 BY MECIAL J. (NOTE) SARS-CoV-2 target nucleic acids are DETECTED.  The SARS-CoV-2 RNA is generally detectable in upper respiratory specimens during the acute phase of infection. Positive results are indicative of the presence of the identified virus, but do not rule out bacterial infection or co-infection with other pathogens not detected by the test. Clinical correlation with patient history and other diagnostic information is necessary to determine patient infection status. The expected result is Negative.  Fact Sheet for Patients: EntrepreneurPulse.com.au  Fact Sheet for Healthcare Providers: IncredibleEmployment.be  This test is not yet approved or cleared by the [place] FDA and  has been authorized for detection and/or diagnosis of SARS-CoV-2 by FDA under an Emergency Use Authorization (EUA).  This EUA will remain in effect (meaning this te st can be used) for the duration of  the COVID-19 declaration under Section 564(b)(1) of  the Act, 21 U.S.C. section 360bbb-3(b)(1), unless the authorization is terminated or revoked sooner.     Influenza A by PCR NEGATIVE NEGATIVE Final   Influenza B by PCR NEGATIVE NEGATIVE Final    Comment: (NOTE) The Xpert Xpress SARS-CoV-2/FLU/RSV plus assay is intended as an aid in the diagnosis of influenza from Nasopharyngeal swab specimens and should not be used as a sole basis for treatment. Nasal washings and aspirates are unacceptable for Xpert Xpress SARS-CoV-2/FLU/RSV testing.  Fact Sheet for Patients: EntrepreneurPulse.com.au  Fact Sheet for Healthcare Providers: IncredibleEmployment.be  This test is not yet approved or cleared by the [place] FDA and has been authorized for detection and/or diagnosis of SARS-CoV-2 by FDA under an Emergency Use Authorization (EUA).  This EUA will remain in effect (meaning this test can be used) for the duration of the COVID-19 declaration under Section 564(b)(1) of the Act, 21 U.S.C. section 360bbb-3(b)(1), unless the authorization is terminated or revoked.  Performed at [facility], [place] [address] [place], [place] [address]          Radiology Studies: ECHOCARDIOGRAM COMPLETE  Result Date: 11/17/2020    ECHOCARDIOGRAM REPORT   Patient Name:   SENECA MOBBS Date of Exam: 11/17/2020 Medical Rec #:  [id number]       Height:       62.0 in Accession #:    [id number]      Weight:       130.0 lb Date of Birth:  Feb 15, 1956      BSA:          1.592 m Patient Age:    84 years        BP:           142/80 mmHg Patient Gender: F               HR:           83 bpm. Exam Location:  Inpatient Procedure: 2D Echo, Intracardiac Opacification Agent, 3D Echo, Strain Analysis,            Cardiac Doppler and Color Doppler Indications:    NSTEMI I21.4  History:        Patient has prior history of Echocardiogram examinations, most                 recent 07/11/2019. Previous Myocardial Infarction, COPD; Risk                 Factors:Hypertension, Dyslipidemia and Former Smoker. COVID 19.  Sonographer:    Darlina Sicilian RDCS Referring Phys: [id number] [place]  1. Akinesis of the entire apex with overall moderate to severe LV dysfunction; no apical thrombus noted using definity; consider LAD infarct vs takotsubo cardiomyopathy.  2. Left ventricular ejection fraction, by estimation, is 30 to 35%. The left ventricle has moderate to severely decreased function. The left ventricle demonstrates regional wall motion abnormalities (see scoring diagram/findings for description). Left ventricular diastolic parameters are consistent with Grade I diastolic dysfunction (impaired relaxation). Elevated left atrial pressure. The average left ventricular global longitudinal strain is -7.9 %. The global longitudinal strain is  abnormal.  3. Right ventricular systolic function is normal. The right ventricular size is normal.  4. The mitral valve is normal in structure. No evidence of mitral valve regurgitation. No evidence of mitral stenosis.  5. The aortic valve is tricuspid. Aortic valve regurgitation is mild. Mild aortic valve sclerosis is present, with no evidence of aortic valve stenosis.  6.  The inferior vena cava is normal in size with greater than 50% respiratory variability, suggesting right atrial pressure of 3 mmHg. FINDINGS  Left Ventricle: Left ventricular ejection fraction, by estimation, is 30 to 35%. The left ventricle has moderate to severely decreased function. The left ventricle demonstrates regional wall motion abnormalities. Definity contrast agent was given IV to delineate the left ventricular endocardial borders. The average left ventricular global longitudinal strain is -7.9 %. The global longitudinal strain is abnormal. The left ventricular internal cavity size was normal in size. There is no left ventricular hypertrophy. Left ventricular diastolic parameters are consistent with Grade I diastolic dysfunction (impaired relaxation). Elevated left atrial pressure. Right Ventricle: The right ventricular size is normal. Right ventricular systolic function is normal. Left Atrium: Left atrial size was normal in size. Right Atrium: Right atrial size was normal in size. Pericardium: There is no evidence of pericardial effusion. Mitral Valve: The mitral valve is normal in structure. No evidence of mitral valve regurgitation. No evidence of mitral valve stenosis. Tricuspid Valve: The tricuspid valve is normal in structure. Tricuspid valve regurgitation is not demonstrated. No evidence of tricuspid stenosis. Aortic Valve: The aortic valve is tricuspid. Aortic valve regurgitation is mild. Aortic regurgitation PHT measures 313 msec. Mild aortic valve sclerosis is present, with no evidence of aortic valve stenosis. Pulmonic  Valve: The pulmonic valve was not well visualized. Pulmonic valve regurgitation is not visualized. No evidence of pulmonic stenosis. Aorta: The aortic root is normal in size and structure. Venous: The inferior vena cava is normal in size with greater than 50% respiratory variability, suggesting right atrial pressure of 3 mmHg. IAS/Shunts: No atrial level shunt detected by color flow Doppler. Additional Comments: Akinesis of the entire apex with overall moderate to severe LV dysfunction; no apical thrombus noted using definity; consider LAD infarct vs takotsubo cardiomyopathy.  LEFT VENTRICLE PLAX 2D LVIDd:         4.50 cm     Diastology LVIDs:         2.30 cm     LV e' medial:    3.25 cm/s LV PW:         0.70 cm     LV E/e' medial:  19.6 LV IVS:        0.80 cm     LV e' lateral:   4.95 cm/s LVOT diam:     2.00 cm     LV E/e' lateral: 12.9 LV SV:         51 LV SV Index:   32          2D Longitudinal Strain LVOT Area:     3.14 cm    2D Strain GLS Avg:     -7.9 %  LV Volumes (MOD) LV vol d, MOD A2C: 95.3 ml 3D Volume EF: LV vol d, MOD A4C: 89.5 ml 3D EF:        43 % LV vol s, MOD A2C: 51.4 ml LV EDV:       154 ml LV vol s, MOD A4C: 46.9 ml LV ESV:       87 ml LV SV MOD A2C:     44.0 ml LV SV:        67 ml LV SV MOD A4C:     89.5 ml LV SV MOD BP:      42.7 ml RIGHT VENTRICLE RV S prime:     9.75 cm/s TAPSE (M-mode): 1.5 cm LEFT ATRIUM  Index LA diam:        2.50 cm 1.57 cm/m LA Vol (A2C):   26.6 ml 16.71 ml/m LA Vol (A4C):   25.7 ml 16.14 ml/m LA Biplane Vol: 27.5 ml 17.28 ml/m  AORTIC VALVE LVOT Vmax:   104.00 cm/s LVOT Vmean:  57.500 cm/s LVOT VTI:    0.162 m AI PHT:      313 msec  AORTA Ao Root diam: 3.20 cm Ao Asc diam:  3.20 cm MITRAL VALVE MV Area (PHT): 3.60 cm    SHUNTS MV Decel Time: 211 msec    Systemic VTI:  0.16 m MV E velocity: 63.70 cm/s  Systemic Diam: 2.00 cm MV A velocity: 80.20 cm/s MV E/A ratio:  0.79 Kirk Ruths MD Electronically signed by Kirk Ruths MD Signature Date/Time:  11/17/2020/10:14:44 AM    Final         Scheduled Meds:  aspirin EC  81 mg Oral Daily   doxycycline  100 mg Oral Q12H   guaiFENesin  1,200 mg Oral Q12H   Ipratropium-Albuterol  1 puff Inhalation Q6H   loratadine  10 mg Oral Daily   losartan  75 mg Oral Daily   magnesium oxide  400 mg Oral Daily   mouth rinse  15 mL Mouth Rinse BID   PARoxetine  30 mg Oral Daily   predniSONE  40 mg Oral Q breakfast   rosuvastatin  20 mg Oral Daily   senna  1 tablet Oral BID   Continuous Infusions:  heparin 950 Units/hr (11/17/20 2056)     LOS: 3 days    Time spent 35 minutes with more than 50% on [place], MD [facility] Pager [phone]

## 2020-11-19 ENCOUNTER — Encounter (HOSPITAL_COMMUNITY)
Admission: EM | Disposition: A | Discharge status: Home / Self Care | Payer: Self-pay | Source: Home / Self Care | Attending: Internal Medicine

## 2020-11-19 ENCOUNTER — Encounter (HOSPITAL_COMMUNITY): Payer: Self-pay | Admitting: Internal Medicine

## 2020-11-19 DIAGNOSIS — J441 Chronic obstructive pulmonary disease with (acute) exacerbation: Secondary | ICD-10-CM | POA: Diagnosis not present

## 2020-11-19 DIAGNOSIS — I214 Non-ST elevation (NSTEMI) myocardial infarction: Secondary | ICD-10-CM | POA: Diagnosis not present

## 2020-11-19 HISTORY — PX: LEFT HEART CATH AND CORONARY ANGIOGRAPHY: CATH118249

## 2020-11-19 LAB — BASIC METABOLIC PANEL
Anion gap: 7 (ref 5–15)
BUN: 34 mg/dL — ABNORMAL HIGH (ref 8–23)
CO2: 29 mmol/L (ref 22–32)
Calcium: 9.6 mg/dL (ref 8.9–10.3)
Chloride: 103 mmol/L (ref 98–111)
Creatinine, Ser: 0.91 mg/dL (ref 0.44–1.00)
GFR, Estimated: >60 mL/min (ref >60)
Glucose, Bld: 95 mg/dL (ref 70–99)
Potassium: 4.5 mmol/L (ref 3.5–5.1)
Sodium: 139 mmol/L (ref 135–145)

## 2020-11-19 LAB — CBC
HCT: 46.2 % — ABNORMAL HIGH (ref 36.0–46.0)
Hemoglobin: 14.7 g/dL (ref 12.0–15.0)
MCH: 30.6 pg (ref 26.0–34.0)
MCHC: 31.8 g/dL (ref 30.0–36.0)
MCV: 96.3 fL (ref 80.0–100.0)
Platelets: 382 10*3/uL (ref 150–400)
RBC: 4.8 MIL/uL (ref 3.87–5.11)
RDW: 14 % (ref 11.5–15.5)
WBC: 16.8 10*3/uL — ABNORMAL HIGH (ref 4.0–10.5)
nRBC: 0 % (ref 0.0–0.2)

## 2020-11-19 LAB — HEPARIN LEVEL (UNFRACTIONATED): Heparin Unfractionated: 0.63 IU/mL (ref 0.30–0.70)

## 2020-11-19 SURGERY — LEFT HEART CATH AND CORONARY ANGIOGRAPHY
Anesthesia: LOCAL

## 2020-11-19 MED ORDER — HEPARIN (PORCINE) IN NACL 1000-0.9 UT/500ML-% IV SOLN
INTRAVENOUS | Status: DC | PRN
  Administered 2020-11-19: 500 mL

## 2020-11-19 MED ORDER — HEPARIN SODIUM (PORCINE) 1000 UNIT/ML IJ SOLN
INTRAMUSCULAR | Status: DC | PRN
  Administered 2020-11-19: 3000 [IU] via INTRAVENOUS

## 2020-11-19 MED ORDER — MIDAZOLAM HCL 2 MG/2ML IJ SOLN
INTRAMUSCULAR | Status: AC
  Filled 2020-11-19: qty 2

## 2020-11-19 MED ORDER — HYDRALAZINE HCL 20 MG/ML IJ SOLN: 10.0000 mg | INTRAMUSCULAR | Status: AC | PRN

## 2020-11-19 MED ORDER — LABETALOL HCL 5 MG/ML IV SOLN: 10.0000 mg | INTRAVENOUS | Status: AC | PRN

## 2020-11-19 MED ORDER — FENTANYL CITRATE (PF) 100 MCG/2ML IJ SOLN
INTRAMUSCULAR | Status: AC
  Filled 2020-11-19: qty 2

## 2020-11-19 MED ORDER — ENOXAPARIN SODIUM 40 MG/0.4ML IJ SOSY
40.0000 mg | PREFILLED_SYRINGE | INTRAMUSCULAR | Status: DC
  Filled 2020-11-19: qty 0.4

## 2020-11-19 MED ORDER — MIDAZOLAM HCL 2 MG/2ML IJ SOLN
INTRAMUSCULAR | Status: DC | PRN
  Administered 2020-11-19: 1 mg via INTRAVENOUS

## 2020-11-19 MED ORDER — SODIUM CHLORIDE 0.9 % IV SOLN: 250.0000 mL | INTRAVENOUS | Status: DC | PRN

## 2020-11-19 MED ORDER — LIDOCAINE HCL (PF) 1 % IJ SOLN
INTRAMUSCULAR | Status: DC | PRN
  Administered 2020-11-19: 2 mL

## 2020-11-19 MED ORDER — METOPROLOL SUCCINATE ER 25 MG PO TB24
12.5000 mg | ORAL_TABLET | Freq: Every day | ORAL | Status: DC
  Administered 2020-11-19 – 2020-11-20 (×2): 12.5 mg via ORAL
  Filled 2020-11-19 (×2): qty 1

## 2020-11-19 MED ORDER — LIDOCAINE HCL (PF) 1 % IJ SOLN
INTRAMUSCULAR | Status: AC
  Filled 2020-11-19: qty 30

## 2020-11-19 MED ORDER — SODIUM CHLORIDE 0.9% FLUSH: 3.0000 mL | INTRAVENOUS | Status: DC | PRN

## 2020-11-19 MED ORDER — VERAPAMIL HCL 2.5 MG/ML IV SOLN
INTRAVENOUS | Status: AC
  Filled 2020-11-19: qty 2

## 2020-11-19 MED ORDER — SODIUM CHLORIDE 0.9% FLUSH
3.0000 mL | Freq: Two times a day (BID) | INTRAVENOUS | Status: DC
  Administered 2020-11-19 – 2020-11-20 (×2): 3 mL via INTRAVENOUS

## 2020-11-19 MED ORDER — FENTANYL CITRATE (PF) 100 MCG/2ML IJ SOLN
INTRAMUSCULAR | Status: DC | PRN
  Administered 2020-11-19: 25 ug via INTRAVENOUS

## 2020-11-19 MED ORDER — HEPARIN (PORCINE) IN NACL 1000-0.9 UT/500ML-% IV SOLN
INTRAVENOUS | Status: AC
  Filled 2020-11-19: qty 1000

## 2020-11-19 MED ORDER — VERAPAMIL HCL 2.5 MG/ML IV SOLN
INTRAVENOUS | Status: DC | PRN
  Administered 2020-11-19: 10 mL via INTRA_ARTERIAL

## 2020-11-19 MED ORDER — IOHEXOL 350 MG/ML SOLN
INTRAVENOUS | Status: DC | PRN
  Administered 2020-11-19: 25 mL

## 2020-11-19 MED ORDER — HEPARIN SODIUM (PORCINE) 1000 UNIT/ML IJ SOLN
INTRAMUSCULAR | Status: AC
  Filled 2020-11-19: qty 1

## 2020-11-19 SURGICAL SUPPLY — 9 items
CATH 5FR JL3.5 JR4 ANG PIG MP (CATHETERS) ×1 IMPLANT
DEVICE RAD TR BAND REGULAR (VASCULAR PRODUCTS) ×1 IMPLANT
GLIDESHEATH SLEND SS 6F .021 (SHEATH) ×1 IMPLANT
GUIDEWIRE INQWIRE 1.5J.035X260 (WIRE) IMPLANT
INQWIRE 1.5J .035X260CM (WIRE) ×2
KIT HEART LEFT (KITS) ×2 IMPLANT
PACK CARDIAC CATHETERIZATION (CUSTOM PROCEDURE TRAY) ×2 IMPLANT
TRANSDUCER W/STOPCOCK (MISCELLANEOUS) ×2 IMPLANT
TUBING CIL FLEX 10 FLL-RA (TUBING) ×2 IMPLANT

## 2020-11-19 NOTE — Progress Notes (Signed)
Progress Note  Patient Name: Kim [name] Date of Encounter: 11/19/2020  [facility] Cardiologist: Lauree Chandler, MD   Subjective   No chest pain. SOB marked improved. Awaiting cath  Inpatient Medications    Scheduled Meds:  aspirin EC  81 mg Oral Daily   doxycycline  100 mg Oral Q12H   guaiFENesin  1,200 mg Oral Q12H   Ipratropium-Albuterol  1 puff Inhalation Q6H   loratadine  10 mg Oral Daily   losartan  75 mg Oral Daily   magnesium oxide  400 mg Oral Daily   mouth rinse  15 mL Mouth Rinse BID   PARoxetine  30 mg Oral Daily   predniSONE  40 mg Oral Q breakfast   rosuvastatin  20 mg Oral Daily   senna  1 tablet Oral BID   sodium chloride flush  3 mL Intravenous Q12H   Continuous Infusions:  sodium chloride     sodium chloride 10 mL/hr at 11/19/20 0621   heparin 900 Units/hr (11/19/20 0621)   PRN Meds: sodium chloride, [DISCONTINUED] acetaminophen **OR** acetaminophen, albuterol, ALPRAZolam, bisacodyl, HYDROcodone-acetaminophen, metoprolol tartrate, ondansetron **OR** ondansetron (ZOFRAN) IV, polyethylene glycol, sodium chloride flush   Vital Signs    Vitals:   11/18/20 0924 11/18/20 1258 11/18/20 2101 11/19/20 0554  BP:  114/81 107/66 114/66  Pulse:  86 90 66  Resp:  (!) '24 16 20  '$ Temp:  97.7 F (36.5 C) 98.2 F (36.8 C) 97.9 F (36.6 C)  TempSrc:  Oral Oral Oral  SpO2:  97% 98% 98%  Weight: 57.6 kg   57 kg  Height:        Intake/Output Summary (Last 24 hours) at 11/19/2020 0935 Last data filed at 11/19/2020 [id number] Gross per 24 hour  Intake 547.8 ml  Output --  Net 547.8 ml   Last 3 Weights 11/19/2020 11/18/2020 11/15/2020  Weight (lbs) 125 lb 10.6 oz 126 lb 15.8 oz 130 lb  Weight (kg) 57 kg 57.6 kg 58.968 kg      Telemetry    NSR - Personally Reviewed  ECG    NSR TWI noted new - Personally Reviewed  Physical Exam   Discussed over phone given airborne precautions.  GEN: No acute distress.  Alert Cardiac: Telemetry with normal  rhythm respiratory: Normal respiratory effort, able to complete full sentences without difficulty. Psych: Normal affect   Labs    High Sensitivity Troponin:   Recent Labs  Lab 11/15/20 1030 11/15/20 1240 11/15/20 1845 11/15/20 2130  TROPONINIHS 74* 389* 746* 835*      Chemistry Recent Labs  Lab 11/15/20 1030 11/16/20 0324 11/16/20 2103 11/17/20 0400 11/19/20 0355  NA 142 140 144 142 139  K 3.6 4.4 3.0* 4.6 4.5  CL 104 104 114* 107 103  CO2 '28 26 24 26 29  '$ GLUCOSE 162* 144* 85 135* 95  BUN 20 28* 27* 29* 34*  CREATININE 0.90 0.86 0.77 0.81 0.91  CALCIUM 9.4 9.9 7.7* 9.6 9.6  PROT 6.8 6.7 4.9*  --   --   ALBUMIN 4.1 3.9 2.9*  --   --   AST '21 24 19  '$ --   --   ALT '18 18 15  '$ --   --   ALKPHOS 71 69 49  --   --   BILITOT 0.5 0.5 0.5  --   --   GFRNONAA >60 >60 >60 >60 >60  ANIONGAP '10 10 6 9 7     '$ Hematology Recent Labs  Lab 11/17/20  0400 11/18/20 0646 11/19/20 0355  WBC 21.6* 15.4* 16.8*  RBC 4.48 4.27 4.80  HGB 13.9 13.4 14.7  HCT 43.2 41.4 46.2*  MCV 96.4 97.0 96.3  MCH 31.0 31.4 30.6  MCHC 32.2 32.4 31.8  RDW 14.2 14.1 14.0  PLT 354 348 382    BNP Recent Labs  Lab 11/15/20 1030  BNP 132.3*     DDimer  Recent Labs  Lab 11/16/20 1000  DDIMER 0.34     Radiology    No results found.  Cardiac Studies      1. Akinesis of the entire apex with overall moderate to severe LV  dysfunction; no apical thrombus noted using definity; consider LAD infarct  vs takotsubo cardiomyopathy.   2. Left ventricular ejection fraction, by estimation, is 30 to 35%. The  left ventricle has moderate to severely decreased function. The left  ventricle demonstrates regional wall motion abnormalities (see scoring  diagram/findings for description). Left  ventricular diastolic parameters are consistent with Grade I diastolic  dysfunction (impaired relaxation). Elevated left atrial pressure. The  average left ventricular global longitudinal strain is -7.9 %. The  global  longitudinal strain is abnormal.   3. Right ventricular systolic function is normal. The right ventricular  size is normal.   4. The mitral valve is normal in structure. No evidence of mitral valve  regurgitation. No evidence of mitral stenosis.   5. The aortic valve is tricuspid. Aortic valve regurgitation is mild.  Mild aortic valve sclerosis is present, with no evidence of aortic valve  stenosis.   6. The inferior vena cava is normal in size with greater than 50%  respiratory variability, suggesting right atrial pressure of 3 mmHg.  Patient Profile     65 y.o. female with non-ST elevation myocardial infarction, cardiomyopathy  Assessment & Plan    Coronary artery disease/abnormal EKG with deep T wave inversions noted/elevated troponin 800, non-ST elevation myocardial infarction - Nonobstructive CAD on cath in 2017. - Dr. Lysbeth Penner plan was to perform cardiac catheterization today.  Orders have been written.  EF previously as low as 25 to 35% but improved to 65% in April 2021.  Could be Takotsubo or perhaps LAD territory ischemia.  COPD exacerbation - Received aggressive bronchodilator therapy IV steroids transition to oral inhalational steroids.  Doxycycline for 7 days.  Negative PE's CT study 2 months ago.  Elevated troponin up to 835 - Could be secondary to stress-induced cardiomyopathy.  Plan for left heart cath.  On heparin drip IV.  Dilated cardiomyopathy - Was given Lasix yesterday.  Apparently euvolemic today.  COVID-19 positivity - Persistently positive by PCR.  Per primary team, no indication for COVID-19 directed therapeutics.  She is on airborne and contact precautions.    For questions or updates, please contact [place] Please consult [email or web address] for contact info under        Signed, Candee Furbish, MD  11/19/2020, 9:35 AM

## 2020-11-19 NOTE — Care Management Important Message (Signed)
Important Message  Patient Details IM Letter given to the Patient. Name: Kim [name] MRN: [id number] Date of Birth: [birth year]   Medicare Important Message Given:  Yes     Kerin Salen 11/19/2020, 12:30 PM

## 2020-11-19 NOTE — Progress Notes (Signed)
ANTICOAGULATION CONSULT NOTE - follow up  Pharmacy Consult for IV heparin Indication:  NSTEMI  Allergies  Allergen Reactions   Tape Other (See Comments)    SKIN TEARS EASILY!!!!   Ciprofloxacin Other (See Comments)    Hallucinations    Lisinopril Cough   Penicillins Hives and Other (See Comments)    Tolerates Omnicef Has patient had a PCN reaction causing immediate rash, facial/tongue/throat swelling, SOB or lightheadedness with hypotension: Yes Has patient had a PCN reaction causing severe rash involving mucus membranes or skin necrosis: No Has patient had a PCN reaction that required hospitalization No Has patient had a PCN reaction occurring within the last 10 years: No If all of the above answers are "NO", then may proceed with Cephalosporin use.     Patient Measurements: Height: '5\' 2"'$  (157.5 cm) Weight: 57 kg (125 lb 10.6 oz) IBW/kg (Calculated) : 50.1 Heparin Dosing Weight: 59 kg  Vital Signs: Temp: 97.9 F (36.6 C) (08/15 0554) Temp Source: Oral (08/15 0554) BP: 114/66 (08/15 0554) Pulse Rate: 66 (08/15 0554)  Labs: Recent Labs    11/16/20 2103 11/16/20 2103 11/17/20 0400 11/17/20 1220 11/18/20 0646 11/18/20 1624 11/19/20 0355  HGB  --    < > 13.9  --  13.4  --  14.7  HCT  --   --  43.2  --  41.4  --  46.2*  PLT  --   --  354  --  348  --  382  HEPARINUNFRC 0.14*  --  1.01*   < > 0.75* 0.51 0.63  CREATININE 0.77  --  0.81  --   --   --  0.91   < > = values in this interval not displayed.    Assessment: 65 yo presented to ER with shortness of breath now to start IV heparin for possible NSTEMI. Baseline labs drawn.   11/19/20 7:42 AM  Heparin level therapeutic at 0.63 on 900 units/hr CBC WNL and stable No bleeding or line interruptions noted  Goal of Therapy:  Heparin level 0.3-0.7 units/ml Monitor platelets by anticoagulation protocol: Yes  Plan:  Continue heparin at 900 units/hr Daily CBC/HL Plan for left heart cath today  Dimple Nanas, PharmD 11/19/2020 7:43 AM

## 2020-11-19 NOTE — Progress Notes (Signed)
PROGRESS NOTE  Kim [name] [id number] DOB: 07/29/1955   PCP: [name], NP  Patient is from: Home.  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  She also had elevated troponin.  Cardiology consulted and recommended LHC, which is planned for today. Patient tested positive for COVID-19 on admission but she had positive test in June 2022.  Subjective: Seen and examined earlier this morning.  No major events overnight of this morning.  Feels better this morning.  Still with some productive cough with whitish phlegm.  She denies hemoptysis.  Denies chest pain, dyspnea, GI or UTI symptoms.  Patient's husband at bedside.  Objective: Vitals:   11/18/20 0924 11/18/20 1258 11/18/20 2101 11/19/20 0554  BP:  114/81 107/66 114/66  Pulse:  86 90 66  Resp:  (!) '24 16 20  '$ Temp:  97.7 F (36.5 C) 98.2 F (36.8 C) 97.9 F (36.6 C)  TempSrc:  Oral Oral Oral  SpO2:  97% 98% 98%  Weight: 57.6 kg   57 kg  Height:        Intake/Output Summary (Last 24 hours) at 11/19/2020 1121 Last data filed at 11/19/2020 [id number] Gross per 24 hour  Intake 543.15 ml  Output --  Net 543.15 ml   Filed Weights   11/15/20 1027 11/18/20 0924 11/19/20 0554  Weight: 59 kg 57.6 kg 57 kg    Examination:  GENERAL: No apparent distress.  Nontoxic. HEENT: MMM.  Vision and hearing grossly intact.  NECK: Supple.  No apparent JVD.  RESP: 98% on 2 L.  No IWOB.  Fair aeration bilaterally. CVS:  RRR. Heart sounds normal.  ABD/GI/GU: BS+. Abd soft, NTND.  MSK/EXT:  Moves extremities. No apparent deformity. No edema.  SKIN: no apparent skin lesion or  wound NEURO: Awake, alert and oriented appropriately.  No apparent focal neuro deficit. PSYCH: Calm. Normal affect.   Procedures:  None  Microbiology summarized: COVID-19 PCR positive. Influenza PCR negative.  Assessment & Plan: COPD with acute exacerbation: Improved.  -Completed steroid course.   -Continue doxycycline, scheduled and as needed inhalers -Wean oxygen.  Will ambulate for saturation assessment after LHC. -Incentive spirometry/OOB/PT/OT  Non-STEMI/history of nonobstructive CAD/Takotsubo cardiomyopathy with recovered EF-high-sensitivity troponin trended from 74-835.  She has significant TWI on his second and third EKG. TTE with LVEF of 30 to 35%, RWMA, akinesis of the entire apex, G1 DD. She denies chest pain. -Continue heparin, aspirin, Toprol -Plan for LHC today  Anxiety and depression: Stable. -Continue home meds  Chronic pain/osteoarthritis of multiple joints -Continue home medications.  COVID-19 test positive. review of her chart reveals that she tested positive in 09/2020.  Patient should not have been tested within 90 days of positive test.  I do not think he current cardiopulmonary symptoms are related to COVID-19 infection.  It is unlikely for her to have another COVID infection within 2 months. -I do not think isolation precaution is indicated   DVT prophylaxis: On heparin for non-STEMI.    Body mass index is 22.98 kg/m.         DVT prophylaxis:    On heparin drip for non-STEMI  Code Status: Full code Family Communication: Patient and/or RN.  Updated patient's husband at bedside. Level of care: Telemetry Status is: Inpatient  Remains inpatient appropriate because:Ongoing diagnostic testing needed not appropriate for outpatient work up, IV treatments appropriate due to intensity of illness or inability to take PO, and Inpatient level of care appropriate due to severity of illness  Dispo: The patient is from: Home              Anticipated d/c is  to: Home              Patient currently is not medically stable to d/c.   Difficult to place patient No       Consultants:  Cardiology   Sch Meds:  Scheduled Meds:  aspirin EC  81 mg Oral Daily   doxycycline  100 mg Oral Q12H   guaiFENesin  1,200 mg Oral Q12H   Ipratropium-Albuterol  1 puff Inhalation Q6H   loratadine  10 mg Oral Daily   losartan  75 mg Oral Daily   magnesium oxide  400 mg Oral Daily   mouth rinse  15 mL Mouth Rinse BID   PARoxetine  30 mg Oral Daily   rosuvastatin  20 mg Oral Daily   senna  1 tablet Oral BID   sodium chloride flush  3 mL Intravenous Q12H   Continuous Infusions:  sodium chloride     sodium chloride 10 mL/hr at 11/19/20 0621   heparin 900 Units/hr (11/19/20 0621)   PRN Meds:.sodium chloride, [DISCONTINUED] acetaminophen **OR** acetaminophen, albuterol, ALPRAZolam, bisacodyl, HYDROcodone-acetaminophen, metoprolol tartrate, ondansetron **OR** ondansetron (ZOFRAN) IV, polyethylene glycol, sodium chloride flush  Antimicrobials: Anti-infectives (From admission, onward)    Start     Dose/Rate Route Frequency Ordered Stop   11/15/20 2200  doxycycline (VIBRA-TABS) tablet 100 mg        100 mg Oral Every 12 hours 11/15/20 1850 11/20/20 2159        I have personally reviewed the following labs and images: CBC: Recent Labs  Lab 11/15/20 1030 11/16/20 0324 11/17/20 0400 11/18/20 0646 11/19/20 0355  WBC 19.6* 16.0* 21.6* 15.4* 16.8*  NEUTROABS 10.0*  --   --   --   --   HGB 14.0 13.7 13.9 13.4 14.7  HCT 44.9 42.9 43.2 41.4 46.2*  MCV 97.6 96.0 96.4 97.0 96.3  PLT 413* 365 354 348 382   BMP &GFR Recent Labs  Lab 11/15/20 1030 11/16/20 0324 11/16/20 2103 11/17/20 0400 11/19/20 0355  NA 142 140 144 142 139  K 3.6 4.4 3.0* 4.6 4.5  CL 104 104 114* 107 103  CO2 '28 26 24 26 29  '$ GLUCOSE 162* 144* 85 135* 95  BUN 20 28* 27* 29* 34*  CREATININE 0.90 0.86 0.77 0.81 0.91  CALCIUM 9.4 9.9 7.7* 9.6 9.6  MG  --  2.3  --  2.3  --    PHOS  --   --   --  4.2  --    Estimated Creatinine Clearance: 48.1 mL/min (by C-G formula based on SCr of 0.91 mg/dL). Liver & Pancreas: Recent Labs  Lab 11/15/20 1030 11/16/20 0324 11/16/20 2103  AST '21 24 19  '$ ALT '18 18 15  '$ ALKPHOS 71 69 49  BILITOT 0.5 0.5 0.5  PROT 6.8 6.7 4.9*  ALBUMIN 4.1 3.9 2.9*   No results for input(s): LIPASE, AMYLASE in the last 168 hours. No results for input(s): AMMONIA in the last 168 hours. Diabetic: No results for input(s): HGBA1C in the last 72 hours. No results for input(s): GLUCAP in  the last 168 hours. Cardiac Enzymes: No results for input(s): CKTOTAL, CKMB, CKMBINDEX, TROPONINI in the last 168 hours. Recent Labs    05/22/20 1404  PROBNP 147   Coagulation Profile: No results for input(s): INR, PROTIME in the last 168 hours. Thyroid Function Tests: No results for input(s): TSH, T4TOTAL, FREET4, T3FREE, THYROIDAB in the last 72 hours. Lipid Profile: No results for input(s): CHOL, HDL, LDLCALC, TRIG, CHOLHDL, LDLDIRECT in the last 72 hours. Anemia Panel: No results for input(s): VITAMINB12, FOLATE, FERRITIN, TIBC, IRON, RETICCTPCT in the last 72 hours. Urine analysis:    Component Value Date/Time   COLORURINE yellow 02/26/2010 0951   APPEARANCEUR Clear 02/26/2010 0951   LABSPEC 1.025 02/26/2010 0951   PHURINE 5.0 02/26/2010 0951   HGBUR trace-lysed 02/26/2010 0951   BILIRUBINUR Negative 01/14/2018 0859   PROTEINUR Negative 01/14/2018 0859   UROBILINOGEN 0.2 01/14/2018 0859   UROBILINOGEN 0.2 02/26/2010 0951   NITRITE Negative 01/14/2018 0859   NITRITE negative 02/26/2010 0951   LEUKOCYTESUR Negative 01/14/2018 0859   Sepsis Labs: Invalid input(s): PROCALCITONIN, [place]  Microbiology: Recent Results (from the past 240 hour(s))  Resp Panel by RT-PCR (Flu A&B, Covid) Nasopharyngeal Swab     Status: Abnormal   Collection Time: 11/15/20 10:33 AM   Specimen: Nasopharyngeal Swab; Nasopharyngeal(NP) swabs in vial transport  medium  Result Value Ref Range Status   SARS Coronavirus 2 by RT PCR POSITIVE (A) NEGATIVE Final    Comment: RESULT CALLED TO, READ BACK BY AND VERIFIED WITH: FRANKLIN C. 11/15/2020 @ 1317 BY MECIAL J. (NOTE) SARS-CoV-2 target nucleic acids are DETECTED.  The SARS-CoV-2 RNA is generally detectable in upper respiratory specimens during the acute phase of infection. Positive results are indicative of the presence of the identified virus, but do not rule out bacterial infection or co-infection with other pathogens not detected by the test. Clinical correlation with patient history and other diagnostic information is necessary to determine patient infection status. The expected result is Negative.  Fact Sheet for Patients: EntrepreneurPulse.com.au  Fact Sheet for Healthcare Providers: IncredibleEmployment.be  This test is not yet approved or cleared by the [place] FDA and  has been authorized for detection and/or diagnosis of SARS-CoV-2 by FDA under an Emergency Use Authorization (EUA).  This EUA will remain in effect (meaning this te st can be used) for the duration of  the COVID-19 declaration under Section 564(b)(1) of the Act, 21 U.S.C. section 360bbb-3(b)(1), unless the authorization is terminated or revoked sooner.     Influenza A by PCR NEGATIVE NEGATIVE Final   Influenza B by PCR NEGATIVE NEGATIVE Final    Comment: (NOTE) The Xpert Xpress SARS-CoV-2/FLU/RSV plus assay is intended as an aid in the diagnosis of influenza from Nasopharyngeal swab specimens and should not be used as a sole basis for treatment. Nasal washings and aspirates are unacceptable for Xpert Xpress SARS-CoV-2/FLU/RSV testing.  Fact Sheet for Patients: EntrepreneurPulse.com.au  Fact Sheet for Healthcare Providers: IncredibleEmployment.be  This test is not yet approved or cleared by the [place] FDA and has been  authorized for detection and/or diagnosis of SARS-CoV-2 by FDA under an Emergency Use Authorization (EUA). This EUA will remain in effect (meaning this test can be used) for the duration of the COVID-19 declaration under Section 564(b)(1) of the Act, 21 U.S.C. section 360bbb-3(b)(1), unless the authorization is terminated or revoked.  Performed at [facility], [place] [address] [place], [place] [address]     Radiology Studies: No results found.  Quintana Canelo T. [place]  If 7PM-7AM, please contact night-coverage [email or web address] 11/19/2020, 11:21 AM

## 2020-11-19 NOTE — Interval H&P Note (Signed)
History and Physical Interval Note:  11/19/2020 4:51 PM  Kim [name]  has presented today for surgery, with the diagnosis of NSTEMI.  The various methods of treatment have been discussed with the patient and family. After consideration of risks, benefits and other options for treatment, the patient has consented to  Procedure(s): LEFT HEART CATH AND CORONARY ANGIOGRAPHY (N/A) as a surgical intervention.  The patient's history has been reviewed, patient examined, no change in status, stable for surgery.  I have reviewed the patient's chart and labs.  Questions were answered to the patient's satisfaction.    Cath Lab Visit (complete for each Cath Lab visit)  Clinical Evaluation Leading to the Procedure:   ACS: Yes.    Non-ACS:  N/A  Hamp [place]

## 2020-11-19 NOTE — H&P (View-Only) (Signed)
Progress Note  Patient Name: Kim [name] Date of Encounter: 11/19/2020  [facility] Cardiologist: Lauree Chandler, MD   Subjective   No chest pain. SOB marked improved. Awaiting cath  Inpatient Medications    Scheduled Meds:  aspirin EC  81 mg Oral Daily   doxycycline  100 mg Oral Q12H   guaiFENesin  1,200 mg Oral Q12H   Ipratropium-Albuterol  1 puff Inhalation Q6H   loratadine  10 mg Oral Daily   losartan  75 mg Oral Daily   magnesium oxide  400 mg Oral Daily   mouth rinse  15 mL Mouth Rinse BID   PARoxetine  30 mg Oral Daily   predniSONE  40 mg Oral Q breakfast   rosuvastatin  20 mg Oral Daily   senna  1 tablet Oral BID   sodium chloride flush  3 mL Intravenous Q12H   Continuous Infusions:  sodium chloride     sodium chloride 10 mL/hr at 11/19/20 0621   heparin 900 Units/hr (11/19/20 0621)   PRN Meds: sodium chloride, [DISCONTINUED] acetaminophen **OR** acetaminophen, albuterol, ALPRAZolam, bisacodyl, HYDROcodone-acetaminophen, metoprolol tartrate, ondansetron **OR** ondansetron (ZOFRAN) IV, polyethylene glycol, sodium chloride flush   Vital Signs    Vitals:   11/18/20 0924 11/18/20 1258 11/18/20 2101 11/19/20 0554  BP:  114/81 107/66 114/66  Pulse:  86 90 66  Resp:  (!) '24 16 20  '$ Temp:  97.7 F (36.5 C) 98.2 F (36.8 C) 97.9 F (36.6 C)  TempSrc:  Oral Oral Oral  SpO2:  97% 98% 98%  Weight: 57.6 kg   57 kg  Height:        Intake/Output Summary (Last 24 hours) at 11/19/2020 0935 Last data filed at 11/19/2020 [id number] Gross per 24 hour  Intake 547.8 ml  Output --  Net 547.8 ml   Last 3 Weights 11/19/2020 11/18/2020 11/15/2020  Weight (lbs) 125 lb 10.6 oz 126 lb 15.8 oz 130 lb  Weight (kg) 57 kg 57.6 kg 58.968 kg      Telemetry    NSR - Personally Reviewed  ECG    NSR TWI noted new - Personally Reviewed  Physical Exam   Discussed over phone given airborne precautions.  GEN: No acute distress.  Alert Cardiac: Telemetry with normal  rhythm respiratory: Normal respiratory effort, able to complete full sentences without difficulty. Psych: Normal affect   Labs    High Sensitivity Troponin:   Recent Labs  Lab 11/15/20 1030 11/15/20 1240 11/15/20 1845 11/15/20 2130  TROPONINIHS 74* 389* 746* 835*      Chemistry Recent Labs  Lab 11/15/20 1030 11/16/20 0324 11/16/20 2103 11/17/20 0400 11/19/20 0355  NA 142 140 144 142 139  K 3.6 4.4 3.0* 4.6 4.5  CL 104 104 114* 107 103  CO2 '28 26 24 26 29  '$ GLUCOSE 162* 144* 85 135* 95  BUN 20 28* 27* 29* 34*  CREATININE 0.90 0.86 0.77 0.81 0.91  CALCIUM 9.4 9.9 7.7* 9.6 9.6  PROT 6.8 6.7 4.9*  --   --   ALBUMIN 4.1 3.9 2.9*  --   --   AST '21 24 19  '$ --   --   ALT '18 18 15  '$ --   --   ALKPHOS 71 69 49  --   --   BILITOT 0.5 0.5 0.5  --   --   GFRNONAA >60 >60 >60 >60 >60  ANIONGAP '10 10 6 9 7     '$ Hematology Recent Labs  Lab 11/17/20  0400 11/18/20 0646 11/19/20 0355  WBC 21.6* 15.4* 16.8*  RBC 4.48 4.27 4.80  HGB 13.9 13.4 14.7  HCT 43.2 41.4 46.2*  MCV 96.4 97.0 96.3  MCH 31.0 31.4 30.6  MCHC 32.2 32.4 31.8  RDW 14.2 14.1 14.0  PLT 354 348 382    BNP Recent Labs  Lab 11/15/20 1030  BNP 132.3*     DDimer  Recent Labs  Lab 11/16/20 1000  DDIMER 0.34     Radiology    No results found.  Cardiac Studies      1. Akinesis of the entire apex with overall moderate to severe LV  dysfunction; no apical thrombus noted using definity; consider LAD infarct  vs takotsubo cardiomyopathy.   2. Left ventricular ejection fraction, by estimation, is 30 to 35%. The  left ventricle has moderate to severely decreased function. The left  ventricle demonstrates regional wall motion abnormalities (see scoring  diagram/findings for description). Left  ventricular diastolic parameters are consistent with Grade I diastolic  dysfunction (impaired relaxation). Elevated left atrial pressure. The  average left ventricular global longitudinal strain is -7.9 %. The  global  longitudinal strain is abnormal.   3. Right ventricular systolic function is normal. The right ventricular  size is normal.   4. The mitral valve is normal in structure. No evidence of mitral valve  regurgitation. No evidence of mitral stenosis.   5. The aortic valve is tricuspid. Aortic valve regurgitation is mild.  Mild aortic valve sclerosis is present, with no evidence of aortic valve  stenosis.   6. The inferior vena cava is normal in size with greater than 50%  respiratory variability, suggesting right atrial pressure of 3 mmHg.  Patient Profile     65 y.o. female with non-ST elevation myocardial infarction, cardiomyopathy  Assessment & Plan    Coronary artery disease/abnormal EKG with deep T wave inversions noted/elevated troponin 800, non-ST elevation myocardial infarction - Nonobstructive CAD on cath in 2017. - Dr. Lysbeth Penner plan was to perform cardiac catheterization today.  Orders have been written.  EF previously as low as 25 to 35% but improved to 65% in April 2021.  Could be Takotsubo or perhaps LAD territory ischemia.  COPD exacerbation - Received aggressive bronchodilator therapy IV steroids transition to oral inhalational steroids.  Doxycycline for 7 days.  Negative PE's CT study 2 months ago.  Elevated troponin up to 835 - Could be secondary to stress-induced cardiomyopathy.  Plan for left heart cath.  On heparin drip IV.  Dilated cardiomyopathy - Was given Lasix yesterday.  Apparently euvolemic today.  COVID-19 positivity - Persistently positive by PCR.  Per primary team, no indication for COVID-19 directed therapeutics.  She is on airborne and contact precautions.    For questions or updates, please contact Amber Please consult [email or web address] for contact info under        Signed, Candee Furbish, MD  11/19/2020, 9:35 AM

## 2020-11-19 NOTE — Plan of Care (Signed)
  Problem: Education: Goal: Understanding of CV disease, CV risk reduction, and recovery process will improve Outcome: Progressing   Problem: Cardiovascular: Goal: Ability to achieve and maintain adequate cardiovascular perfusion will improve Outcome: Progressing Goal: Vascular access site(s) Level 0-1 will be maintained Outcome: Progressing

## 2020-11-20 ENCOUNTER — Ambulatory Visit: Payer: Medicare Other

## 2020-11-20 ENCOUNTER — Other Ambulatory Visit: Payer: Self-pay | Admitting: Student

## 2020-11-20 ENCOUNTER — Other Ambulatory Visit (HOSPITAL_COMMUNITY): Payer: Self-pay

## 2020-11-20 DIAGNOSIS — I5181 Takotsubo syndrome: Secondary | ICD-10-CM

## 2020-11-20 DIAGNOSIS — F419 Anxiety disorder, unspecified: Secondary | ICD-10-CM | POA: Diagnosis not present

## 2020-11-20 DIAGNOSIS — G894 Chronic pain syndrome: Secondary | ICD-10-CM

## 2020-11-20 DIAGNOSIS — J441 Chronic obstructive pulmonary disease with (acute) exacerbation: Secondary | ICD-10-CM | POA: Diagnosis not present

## 2020-11-20 DIAGNOSIS — I214 Non-ST elevation (NSTEMI) myocardial infarction: Secondary | ICD-10-CM | POA: Diagnosis not present

## 2020-11-20 DIAGNOSIS — F32A Depression, unspecified: Secondary | ICD-10-CM

## 2020-11-20 DIAGNOSIS — I10 Essential (primary) hypertension: Secondary | ICD-10-CM | POA: Diagnosis not present

## 2020-11-20 LAB — CBC
HCT: 41.1 % (ref 36.0–46.0)
Hemoglobin: 13.6 g/dL (ref 12.0–15.0)
MCH: 31.3 pg (ref 26.0–34.0)
MCHC: 33.1 g/dL (ref 30.0–36.0)
MCV: 94.5 fL (ref 80.0–100.0)
Platelets: 351 10*3/uL (ref 150–400)
RBC: 4.35 MIL/uL (ref 3.87–5.11)
RDW: 13.9 % (ref 11.5–15.5)
WBC: 13.9 10*3/uL — ABNORMAL HIGH (ref 4.0–10.5)
nRBC: 0 % (ref 0.0–0.2)

## 2020-11-20 LAB — RENAL FUNCTION PANEL
Albumin: 3.2 g/dL — ABNORMAL LOW (ref 3.5–5.0)
Anion gap: 7 (ref 5–15)
BUN: 33 mg/dL — ABNORMAL HIGH (ref 8–23)
CO2: 28 mmol/L (ref 22–32)
Calcium: 9 mg/dL (ref 8.9–10.3)
Chloride: 103 mmol/L (ref 98–111)
Creatinine, Ser: 0.9 mg/dL (ref 0.44–1.00)
GFR, Estimated: >60 mL/min (ref >60)
Glucose, Bld: 103 mg/dL — ABNORMAL HIGH (ref 70–99)
Phosphorus: 3.8 mg/dL (ref 2.5–4.6)
Potassium: 3.9 mmol/L (ref 3.5–5.1)
Sodium: 138 mmol/L (ref 135–145)

## 2020-11-20 LAB — BRAIN NATRIURETIC PEPTIDE: B Natriuretic Peptide: 211.4 pg/mL — ABNORMAL HIGH (ref 0.0–100.0)

## 2020-11-20 LAB — MAGNESIUM: Magnesium: 2.1 mg/dL (ref 1.7–2.4)

## 2020-11-20 MED ORDER — SACUBITRIL-VALSARTAN 24-26 MG PO TABS
1.0000 | ORAL_TABLET | Freq: Two times a day (BID) | ORAL | Status: DC
  Filled 2020-11-20: qty 1

## 2020-11-20 MED ORDER — FUROSEMIDE 40 MG PO TABS: 40.0000 mg | ORAL_TABLET | Freq: Every day | ORAL | Status: DC | PRN

## 2020-11-20 MED ORDER — NYSTATIN 100000 UNIT/ML MT SUSP: 5.0000 mL | Freq: Four times a day (QID) | OROMUCOSAL | Status: DC | PRN

## 2020-11-20 MED ORDER — IPRATROPIUM-ALBUTEROL 20-100 MCG/ACT IN AERS: 1.0000 | INHALATION_SPRAY | Freq: Two times a day (BID) | RESPIRATORY_TRACT | Status: DC

## 2020-11-20 MED ORDER — METOPROLOL SUCCINATE ER 25 MG PO TB24
12.5000 mg | ORAL_TABLET | Freq: Every day | ORAL | 0 refills | Status: DC
  Filled 2020-11-20: qty 30, 60d supply, fill #0

## 2020-11-20 MED ORDER — ACETAMINOPHEN 650 MG RE SUPP
650.0000 mg | Freq: Four times a day (QID) | RECTAL | 0 refills | Status: DC | PRN
  Filled 2020-11-20: qty 12, 3d supply, fill #0

## 2020-11-20 MED ORDER — SACUBITRIL-VALSARTAN 24-26 MG PO TABS
1.0000 | ORAL_TABLET | Freq: Two times a day (BID) | ORAL | 0 refills | Status: DC
Start: 2020-11-20 — End: 2020-12-21
  Filled 2020-11-20: qty 60, 30d supply, fill #0

## 2020-11-20 NOTE — TOC Benefit Eligibility Note (Signed)
Patient Teacher, English as a foreign language completed.    The patient is currently admitted and upon discharge could be taking Entresto 24-26 mg.  The current 30 day co-pay is, $156.63.   The patient is insured through [place] [place] Patient Advocate Specialist [place] Team Direct Number: [phone]  Fax: [phone]

## 2020-11-20 NOTE — Progress Notes (Signed)
Progress Note  Patient Name: Kim [name] Date of Encounter: 11/20/2020  [facility] Cardiologist: Lauree Chandler, MD   Subjective   Feels well, smiling, husband in room.  No chest pain no shortness of breath.  Described heart catheterization which revealed no significant coronary artery disease  Inpatient Medications    Scheduled Meds:  aspirin EC  81 mg Oral Daily   enoxaparin (LOVENOX) injection  40 mg Subcutaneous Q24H   guaiFENesin  1,200 mg Oral Q12H   Ipratropium-Albuterol  1 puff Inhalation BID   loratadine  10 mg Oral Daily   losartan  75 mg Oral Daily   magnesium oxide  400 mg Oral Daily   mouth rinse  15 mL Mouth Rinse BID   metoprolol succinate  12.5 mg Oral Daily   PARoxetine  30 mg Oral Daily   rosuvastatin  20 mg Oral Daily   senna  1 tablet Oral BID   sodium chloride flush  3 mL Intravenous Q12H   Continuous Infusions:  sodium chloride     PRN Meds: sodium chloride, [DISCONTINUED] acetaminophen **OR** acetaminophen, albuterol, ALPRAZolam, bisacodyl, HYDROcodone-acetaminophen, ondansetron **OR** ondansetron (ZOFRAN) IV, polyethylene glycol, sodium chloride flush   Vital Signs    Vitals:   11/20/20 0415 11/20/20 0507 11/20/20 0811 11/20/20 0832  BP: 106/64  127/68   Pulse: 60  65   Resp: 17     Temp: 97.9 F (36.6 C)     TempSrc: Oral     SpO2: 97%   100%  Weight:  59 kg    Height:        Intake/Output Summary (Last 24 hours) at 11/20/2020 0917 Last data filed at 11/19/2020 2200 Gross per 24 hour  Intake 280.48 ml  Output --  Net 280.48 ml   Last 3 Weights 11/20/2020 11/19/2020 11/18/2020  Weight (lbs) 130 lb 1.1 oz 125 lb 10.6 oz 126 lb 15.8 oz  Weight (kg) 59 kg 57 kg 57.6 kg      Telemetry    SR- Personally Reviewed  ECG    No new - Personally Reviewed  Physical Exam   GEN: No acute distress.   Neck: No JVD Cardiac: RRR, no murmurs, rubs, or gallops.  Respiratory: Clear to auscultation bilaterally. GI: Soft,  nontender, non-distended  MS: No edema; No deformity. Neuro:  Nonfocal  Psych: Normal affect   Labs    High Sensitivity Troponin:   Recent Labs  Lab 11/15/20 1030 11/15/20 1240 11/15/20 1845 11/15/20 2130  TROPONINIHS 74* 389* 746* 835*      Chemistry Recent Labs  Lab 11/15/20 1030 11/16/20 0324 11/16/20 2103 11/17/20 0400 11/19/20 0355 11/20/20 0316  NA 142 140 144 142 139 138  K 3.6 4.4 3.0* 4.6 4.5 3.9  CL 104 104 114* 107 103 103  CO2 '28 26 24 26 29 28  '$ GLUCOSE 162* 144* 85 135* 95 103*  BUN 20 28* 27* 29* 34* 33*  CREATININE 0.90 0.86 0.77 0.81 0.91 0.90  CALCIUM 9.4 9.9 7.7* 9.6 9.6 9.0  PROT 6.8 6.7 4.9*  --   --   --   ALBUMIN 4.1 3.9 2.9*  --   --  3.2*  AST '21 24 19  '$ --   --   --   ALT '18 18 15  '$ --   --   --   ALKPHOS 71 69 49  --   --   --   BILITOT 0.5 0.5 0.5  --   --   --  GFRNONAA >60 >60 >60 >60 >60 >60  ANIONGAP '10 10 6 9 7 7     '$ Hematology Recent Labs  Lab 11/18/20 0646 11/19/20 0355 11/20/20 0316  WBC 15.4* 16.8* 13.9*  RBC 4.27 4.80 4.35  HGB 13.4 14.7 13.6  HCT 41.4 46.2* 41.1  MCV 97.0 96.3 94.5  MCH 31.4 30.6 31.3  MCHC 32.4 31.8 33.1  RDW 14.1 14.0 13.9  PLT 348 382 351    BNP Recent Labs  Lab 11/15/20 1030 11/20/20 0316  BNP 132.3* 211.4*     DDimer  Recent Labs  Lab 11/16/20 1000  DDIMER 0.34     Radiology    CARDIAC CATHETERIZATION  Result Date: 11/19/2020 Conclusions: Mild plaquing of the proximal LAD with minimal luminal narrowing.  Otherwise, no angiographically significant coronary artery disease.  Findings are similar to prior catheterization in 2017 and presumably due to stress-induced cardiomyopathy. Recommendations: Medical therapy and risk factor modification to prevent progression of coronary artery disease. Escalate goal-directed medical therapy for treatment of non-ischemic cardiomyopathy (presumably stress-induced). [name], MD [facility]   Cardiac Studies   Cath this admit Mild  plaquing of the proximal LAD with minimal luminal narrowing.  Otherwise, no angiographically significant coronary artery disease.  Findings are similar to prior catheterization in 2017 and presumably due to stress-induced cardiomyopathy.  Patient Profile     65 y.o. female here with stress-induced cardiomyopathy, cardiac catheterization to place on 11/19/2020 no significant CAD.  Assessment & Plan    Stress-induced cardiomyopathy/Takotsubo Acute systolic heart failure Type II myocardial infarction secondary to cardiomyopathy.  Not true ACS Elevated troponin, EF 30 to 35% - Similar to prior episode a few years back. - Continue with beta-blocker, very low-dose at this point. -I will try to transition her over to [facility] 24/26 twice daily and stop her angiotensin receptor blocker losartan 75.  Blood pressure at times is been fairly soft.  Do not have room at this point to add spironolactone. - Hopefully over the next several weeks ejection fraction will return to normal.  Would recommend repeating echocardiogram in 1 to 2 months in the outpatient setting. -Try to avoid heavy exertional activity over the next few weeks.  She may walk, go up and down stairs without difficulty.  COPD exacerbation - Per primary team.  Negative PE CT study 2 months ago.  Doxycycline for total of 7 days initiated.  Oral steroids.  COVID-19 positivity - Persistently positive by PCR.  Asymptomatic at this point.  No therapeutics.  Labile hypertension - At times her blood pressure can be quite low at home she is recorded 80/50 but she is also recorded in the [id number] systolic range.  I instructed her to be careful with her Entresto low-dose.  Obviously if she has a episode of low blood pressure, she should try to aggressively hydrate for that moment.  I am comfortable with discharge.  We will establish follow-up.  Echocardiogram as outpatient.  35 minutes spent with patient counseling, data review  For questions or  updates, please contact Will Please consult [email or web address] for contact info under        Signed, Candee Furbish, MD  11/20/2020, 9:17 AM

## 2020-11-20 NOTE — Progress Notes (Signed)
Ordered outpatient Echo to reassess LV function per Dr. Marlou Porch. Please see his rounding note from today for more information.  Darreld Mclean, PA-C 11/20/2020 10:41 AM

## 2020-11-20 NOTE — TOC Progression Note (Signed)
Transition of Care [facility]) - Progression Note    Patient Details  Name: Kim [name] MRN: [id number] Date of Birth: 06/02/1955  Transition of Care [facility]) CM/SW Contact  Zenon Mayo, RN Phone Number: 11/20/2020, 12:11 PM  Clinical Narrative:    Patient states she is not going to be able to afford the entresto for the refills, it will be too expensive for her.   Expected Discharge Plan: Home/Self Care Barriers to Discharge: No Barriers Identified  Expected Discharge Plan and Services Expected Discharge Plan: Home/Self Care       Living arrangements for the past 2 months: Single Family Home Expected Discharge Date: 11/20/20                                     Social Determinants of Health (SDOH) Interventions    Readmission Risk Interventions No flowsheet data found.

## 2020-11-20 NOTE — Discharge Instructions (Signed)
Follow with Primary MD [name], NP in 7 days   Get CBC, CMP,  checked  by Primary MD next visit.   Disposition Home    Diet: Heart Healthy  , with feeding assistance and aspiration precautions.  For Heart failure patients - Check your Weight same time everyday, if you gain over 2 pounds, or you develop in leg swelling, experience more shortness of breath or chest pain, call your Primary MD immediately. Follow Cardiac Low Salt Diet and 1.5 lit/day fluid restriction.   On your next visit with your primary care physician please Get Medicines reviewed and adjusted.   Please request your Prim.MD to go over all Hospital Tests and Procedure/Radiological results at the follow up, please get all Hospital records sent to your Prim MD by signing hospital release before you go home.   If you experience worsening of your admission symptoms, develop shortness of breath, life threatening emergency, suicidal or homicidal thoughts you must seek medical attention immediately by calling 911 or calling your MD immediately  if symptoms less severe.  You Must read complete instructions/literature along with all the possible adverse reactions/side effects for all the Medicines you take and that have been prescribed to you. Take any new Medicines after you have completely understood and accpet all the possible adverse reactions/side effects.   Do not drive, operating heavy machinery, perform activities at heights, swimming or participation in water activities or provide baby sitting services if your were admitted for syncope or siezures until you have seen by Primary MD or a Neurologist and advised to do so again.  Do not drive when taking Pain medications.    Do not take more than prescribed Pain, Sleep and Anxiety Medications  Special Instructions: If you have smoked or chewed Tobacco  in the last 2 yrs please stop smoking, stop any regular Alcohol  and or any Recreational drug use.  Wear Seat belts  while driving.   Please note  You were cared for by a hospitalist during your hospital stay. If you have any questions about your discharge medications or the care you received while you were in the hospital after you are discharged, you can call the unit and asked to speak with the hospitalist on call if the hospitalist that took care of you is not available. Once you are discharged, your primary care physician will handle any further medical issues. Please note that NO REFILLS for any discharge medications will be authorized once you are discharged, as it is imperative that you return to your primary care physician (or establish a relationship with a primary care physician if you do not have one) for your aftercare needs so that they can reassess your need for medications and monitor your lab values.

## 2020-11-20 NOTE — Progress Notes (Signed)
CARDIAC REHAB PHASE I   Post-cath order received. Pt did not get an intervention, on CoVid precautions. Not appropriate for cardiac rehab phase one at this time.  Rufina Falco, RN BSN 11/20/2020 10:06 AM

## 2020-11-20 NOTE — Progress Notes (Signed)
TR BAND REMOVAL  LOCATION:    right radial  DEFLATED PER PROTOCOL:    Yes.    TIME BAND OFF / DRESSING APPLIED:    2030   SITE UPON ARRIVAL:    Level 0  SITE AFTER BAND REMOVAL:    Level 0  CIRCULATION SENSATION AND MOVEMENT:    Within Normal Limits   Yes.    COMMENTS:   Pt.tolerated procedure well

## 2020-11-20 NOTE — Discharge Summary (Signed)
Physician Discharge Summary  Kim [name] [id number] DOB: Sep 13, 1955 DOA: 11/15/2020  PCP: [name], NP  Admit date: 11/15/2020 Discharge date: 11/20/2020  Admitted From: Home Disposition:  Home  Recommendations for Outpatient Follow-up:  Follow up with PCP in 1-2 weeks Please obtain BMP/CBC in one week Follow-up with cardiology as an outpatient regarding repeat echo  Home Health:NO Equipment/Devices:None  Discharge Condition:Stable CODE STATUS:FULL Diet recommendation: Heart Healthy   Brief/Interim Summary:  COPD with acute exacerbation: Improved.  -Completed steroid course.   -treated with doxycycline, scheduled and as needed inhalers -Incentive spirometry/OOB/PT/OT   Stress-induced cardiomyopathy/Takotsubo/ Acute systolic heart failure/Type II myocardial infarction secondary to cardiomyopathy.  Not true ACS Elevated troponin, EF 30 to 35% -Cardiology, on low-dose beta-blockers, as well started on low-dose Entresto, plan to repeat echo as an outpatient per cardiology.  No room in her blood pressure to add Aldactone at this point. - Similar to prior episode a few years back. - Continue with beta-blocker, very low-dose at this point. -Left cardiac cath this admission significant for Mild plaquing of the proximal LAD with minimal luminal narrowing.  Otherwise, no angiographically significant coronary artery disease.  Findings are similar to prior catheterization in 2017 and presumably due to stress-induced cardiomyopathy. -Try to avoid heavy exertional activity over the next few weeks.  She may walk, go up and down stairs without difficulty.    Anxiety and depression: Stable. -Continue home meds   Chronic pain/osteoarthritis of multiple joints -Continue home medications.  COVID-19 test positive. review of her chart reveals that she tested positive in 09/2020.  Patient should not have been tested within 90 days of positive test.  I do not think he current  cardiopulmonary symptoms are related to COVID-19 infection.  It is unlikely for her to have another COVID infection within 2 months. -I do not think isolation precaution is indicated   Discharge Diagnoses:  Principal Problem:   COPD with acute exacerbation ([place]) Active Problems:   HLD (hyperlipidemia)   Chronic diastolic CHF (congestive heart failure) (HCC)   Essential hypertension   NSTEMI (non-ST elevated myocardial infarction) [facility])    Discharge Instructions  Discharge Instructions     Diet - low sodium heart healthy   Complete by: As directed    Increase activity slowly   Complete by: As directed       Allergies as of 11/20/2020       Reactions   Tape Other (See Comments)   SKIN TEARS EASILY!!!!   Ciprofloxacin Other (See Comments)   Hallucinations    Lisinopril Cough   Penicillins Hives, Other (See Comments)   Tolerates Omnicef Has patient had a PCN reaction causing immediate rash, facial/tongue/throat swelling, SOB or lightheadedness with hypotension: Yes Has patient had a PCN reaction causing severe rash involving mucus membranes or skin necrosis: No Has patient had a PCN reaction that required hospitalization No Has patient had a PCN reaction occurring within the last 10 years: No If all of the above answers are "NO", then may proceed with Cephalosporin use.        Medication List     STOP taking these medications    losartan 50 MG tablet Commonly known as: COZAAR       TAKE these medications    acetaminophen 650 MG suppository Commonly known as: TYLENOL Place 1 suppository (650 mg total) rectally every 6 (six) hours as needed for mild pain (or Fever >/= 101).   albuterol 108 (90 Base) MCG/ACT inhaler Commonly known as: VENTOLIN HFA  Take two puffs every 4-6 hours as needed for shortness of breath/wheezing . 90 day supply What changed:  how much to take how to take this when to take this reasons to take this additional instructions    ALPRAZolam 0.5 MG tablet Commonly known as: XANAX TAKE 1/2 TO 1 TABLET BY MOUTH THREE TIMES DAILY What changed: See the new instructions.   aspirin 81 MG EC tablet Take 1 tablet (81 mg total) by mouth daily.   Breztri Aerosphere 160-9-4.8 MCG/ACT Aero Generic drug: Budeson-Glycopyrrol-Formoterol Inhale 2 puffs into the lungs in the morning and at bedtime.   cetirizine 10 MG tablet Commonly known as: ZYRTEC Take 10 mg by mouth daily.   furosemide 40 MG tablet Commonly known as: LASIX Take 1 tablet (40 mg total) by mouth daily as needed (for an overnight weight gain of 3-5 pounds of fluid).   HYDROcodone-acetaminophen 7.5-325 MG tablet Commonly known as: Norco Take 1 tablet by mouth every 6 (six) hours as needed for moderate pain.   ipratropium-albuterol 0.5-2.5 (3) MG/3ML Soln Commonly known as: DUONEB USE 3 ML VIA NEBULIZER EVERY 6 HOURS AS NEEDED.  DX: J44.9 What changed:  how much to take how to take this when to take this reasons to take this additional instructions   magnesium oxide 400 MG tablet Commonly known as: MAG-OX Take 400 mg by mouth daily.   metoprolol succinate 25 MG 24 hr tablet Commonly known as: TOPROL-XL Take 0.5 tablets (12.5 mg total) by mouth daily. Start taking on: November 21, 2020   Mucinex Maximum Strength 1200 MG Tb12 Generic drug: Guaifenesin Take 1,200 mg by mouth every 12 (twelve) hours.   nitroGLYCERIN 0.4 MG SL tablet Commonly known as: NITROSTAT Place 1 tablet (0.4 mg total) under the tongue every 5 (five) minutes as needed for chest pain.   nystatin 100000 UNIT/ML suspension Commonly known as: MYCOSTATIN Take 5 mLs (500,000 Units total) by mouth 4 (four) times daily as needed (for thrush).   OXYGEN Inhale 2 L/min into the lungs continuous.   PARoxetine 30 MG tablet Commonly known as: PAXIL TAKE 1 TABLET EVERY DAY What changed: when to take this   rosuvastatin 20 MG tablet Commonly known as: CRESTOR Take 1 tablet (20 mg  total) by mouth daily.   sacubitril-valsartan 24-26 MG Commonly known as: ENTRESTO Take 1 tablet by mouth 2 (two) times daily.   Spacer/Aero Chamber Mouthpiece Misc 1 Device by Does not apply route as directed.        Follow-up Information     [place] [facility] Follow up.   Specialty: Cardiology Why: Appointment for repeat Echocardiogram (ultrasound of your heart) scheduled for 12/19/2020 at 1:00pm. Please arrive 15 minutes early for check-in. Contact information: [address] [place] [phone]        Richardson Dopp T, PA-C Follow up.   Specialties: Cardiology, Physician Assistant Why: Hospital follow-up with Cardiology scheduled for 12/21/2020 at 11:15am with Richardson Dopp, one of Dr. Camillia Herter PAs. Please arrive 15 minutes early for check-in. If this date/time does not work, please call our office to reschedule. Contact information: [id number] [place] [address] [phone]                Allergies  Allergen Reactions   Tape Other (See Comments)    SKIN TEARS EASILY!!!!   Ciprofloxacin Other (See Comments)    Hallucinations    Lisinopril Cough   Penicillins Hives and Other (  See Comments)    Tolerates Omnicef Has patient had a PCN reaction causing immediate rash, facial/tongue/throat swelling, SOB or lightheadedness with hypotension: Yes Has patient had a PCN reaction causing severe rash involving mucus membranes or skin necrosis: No Has patient had a PCN reaction that required hospitalization No Has patient had a PCN reaction occurring within the last 10 years: No If all of the above answers are "NO", then may proceed with Cephalosporin use.     Consultations: cardiology   Procedures/Studies: DG Chest 2 View  Result Date: 11/15/2020 CLINICAL DATA:  Shortness of breath. EXAM: CHEST - 2 VIEW COMPARISON:  09/22/2020, 09/19/2020 FINDINGS: The heart size and mediastinal  contours are within normal limits. Stable chronic lung disease with mild hyperinflation and diffuse interstitial prominence, not significantly changed from prior exams. No superimposed airspace opacity, pleural effusion, or pneumothorax. Redemonstrated compression deformities in the midthoracic spine IMPRESSION: Stable chronic lung disease without superimposed acute process. Electronically Signed   By: Merilyn Baba MD   On: 11/15/2020 11:19   CARDIAC CATHETERIZATION  Result Date: 11/19/2020 Conclusions: Mild plaquing of the proximal LAD with minimal luminal narrowing.  Otherwise, no angiographically significant coronary artery disease.  Findings are similar to prior catheterization in 2017 and presumably due to stress-induced cardiomyopathy. Recommendations: Medical therapy and risk factor modification to prevent progression of coronary artery disease. Escalate goal-directed medical therapy for treatment of non-ischemic cardiomyopathy (presumably stress-induced). [name], MD [facility]  ECHOCARDIOGRAM COMPLETE  Result Date: 11/17/2020    ECHOCARDIOGRAM REPORT   Patient Name:   Kim [name] Date of Exam: 11/17/2020 Medical Rec #:  [id number]       Height:       62.0 in Accession #:    [id number]      Weight:       130.0 lb Date of Birth:  1955/04/15      BSA:          1.592 m Patient Age:    65 years        BP:           142/80 mmHg Patient Gender: F               HR:           83 bpm. Exam Location:  Inpatient Procedure: 2D Echo, Intracardiac Opacification Agent, 3D Echo, Strain Analysis,            Cardiac Doppler and Color Doppler Indications:    NSTEMI I21.4  History:        Patient has prior history of Echocardiogram examinations, most                 recent 07/11/2019. Previous Myocardial Infarction, COPD; Risk                 Factors:Hypertension, Dyslipidemia and Former Smoker. COVID 19.  Sonographer:    Darlina Sicilian RDCS Referring Phys: [id number] [place]  1.  Akinesis of the entire apex with overall moderate to severe LV dysfunction; no apical thrombus noted using definity; consider LAD infarct vs takotsubo cardiomyopathy.  2. Left ventricular ejection fraction, by estimation, is 30 to 35%. The left ventricle has moderate to severely decreased function. The left ventricle demonstrates regional wall motion abnormalities (see scoring diagram/findings for description). Left ventricular diastolic parameters are consistent with Grade I diastolic dysfunction (impaired relaxation). Elevated left atrial pressure. The average left ventricular global longitudinal strain is -7.9 %. The global longitudinal strain is abnormal.  3. Right ventricular systolic function is normal. The right ventricular size is normal.  4. The mitral valve is normal in structure. No evidence of mitral valve regurgitation. No evidence of mitral stenosis.  5. The aortic valve is tricuspid. Aortic valve regurgitation is mild. Mild aortic valve sclerosis is present, with no evidence of aortic valve stenosis.  6. The inferior vena cava is normal in size with greater than 50% respiratory variability, suggesting right atrial pressure of 3 mmHg. FINDINGS  Left Ventricle: Left ventricular ejection fraction, by estimation, is 30 to 35%. The left ventricle has moderate to severely decreased function. The left ventricle demonstrates regional wall motion abnormalities. Definity contrast agent was given IV to delineate the left ventricular endocardial borders. The average left ventricular global longitudinal strain is -7.9 %. The global longitudinal strain is abnormal. The left ventricular internal cavity size was normal in size. There is no left ventricular hypertrophy. Left ventricular diastolic parameters are consistent with Grade I diastolic dysfunction (impaired relaxation). Elevated left atrial pressure. Right Ventricle: The right ventricular size is normal. Right ventricular systolic function is normal. Left  Atrium: Left atrial size was normal in size. Right Atrium: Right atrial size was normal in size. Pericardium: There is no evidence of pericardial effusion. Mitral Valve: The mitral valve is normal in structure. No evidence of mitral valve regurgitation. No evidence of mitral valve stenosis. Tricuspid Valve: The tricuspid valve is normal in structure. Tricuspid valve regurgitation is not demonstrated. No evidence of tricuspid stenosis. Aortic Valve: The aortic valve is tricuspid. Aortic valve regurgitation is mild. Aortic regurgitation PHT measures 313 msec. Mild aortic valve sclerosis is present, with no evidence of aortic valve stenosis. Pulmonic Valve: The pulmonic valve was not well visualized. Pulmonic valve regurgitation is not visualized. No evidence of pulmonic stenosis. Aorta: The aortic root is normal in size and structure. Venous: The inferior vena cava is normal in size with greater than 50% respiratory variability, suggesting right atrial pressure of 3 mmHg. IAS/Shunts: No atrial level shunt detected by color flow Doppler. Additional Comments: Akinesis of the entire apex with overall moderate to severe LV dysfunction; no apical thrombus noted using definity; consider LAD infarct vs takotsubo cardiomyopathy.  LEFT VENTRICLE PLAX 2D LVIDd:         4.50 cm     Diastology LVIDs:         2.30 cm     LV e' medial:    3.25 cm/s LV PW:         0.70 cm     LV E/e' medial:  19.6 LV IVS:        0.80 cm     LV e' lateral:   4.95 cm/s LVOT diam:     2.00 cm     LV E/e' lateral: 12.9 LV SV:         51 LV SV Index:   32          2D Longitudinal Strain LVOT Area:     3.14 cm    2D Strain GLS Avg:     -7.9 %  LV Volumes (MOD) LV vol d, MOD A2C: 95.3 ml 3D Volume EF: LV vol d, MOD A4C: 89.5 ml 3D EF:        43 % LV vol s, MOD A2C: 51.4 ml LV EDV:       154 ml LV vol s, MOD A4C: 46.9 ml LV ESV:       87 ml LV SV MOD A2C:  44.0 ml LV SV:        67 ml LV SV MOD A4C:     89.5 ml LV SV MOD BP:      42.7 ml RIGHT VENTRICLE  RV S prime:     9.75 cm/s TAPSE (M-mode): 1.5 cm LEFT ATRIUM             Index LA diam:        2.50 cm 1.57 cm/m LA Vol (A2C):   26.6 ml 16.71 ml/m LA Vol (A4C):   25.7 ml 16.14 ml/m LA Biplane Vol: 27.5 ml 17.28 ml/m  AORTIC VALVE LVOT Vmax:   104.00 cm/s LVOT Vmean:  57.500 cm/s LVOT VTI:    0.162 m AI PHT:      313 msec  AORTA Ao Root diam: 3.20 cm Ao Asc diam:  3.20 cm MITRAL VALVE MV Area (PHT): 3.60 cm    SHUNTS MV Decel Time: 211 msec    Systemic VTI:  0.16 m MV E velocity: 63.70 cm/s  Systemic Diam: 2.00 cm MV A velocity: 80.20 cm/s MV E/A ratio:  0.79 Kirk Ruths MD Electronically signed by Kirk Ruths MD Signature Date/Time: 11/17/2020/10:14:44 AM    Final       Subjective:  Denies any dyspnea today, no chest pain.  Discharge Exam: Vitals:   11/20/20 0832 11/20/20 1123  BP:  (!) 101/55  Pulse:  73  Resp:  18  Temp:  98.4 F (36.9 C)  SpO2: 100% 98%   Vitals:   11/20/20 0507 11/20/20 0811 11/20/20 0832 11/20/20 1123  BP:  127/68  (!) 101/55  Pulse:  65  73  Resp:    18  Temp:    98.4 F (36.9 C)  TempSrc:    Oral  SpO2:   100% 98%  Weight: 59 kg     Height:        General: Pt is alert, awake, not in acute distress Cardiovascular: RRR, S1/S2 +, no rubs, no gallops Respiratory: CTA bilaterally, no wheezing, no rhonchi Abdominal: Soft, NT, ND, bowel sounds + Extremities: no edema, no cyanosis    The results of significant diagnostics from this hospitalization (including imaging, microbiology, ancillary and laboratory) are listed below for reference.     Microbiology: Recent Results (from the past 240 hour(s))  Resp Panel by RT-PCR (Flu A&B, Covid) Nasopharyngeal Swab     Status: Abnormal   Collection Time: 11/15/20 10:33 AM   Specimen: Nasopharyngeal Swab; Nasopharyngeal(NP) swabs in vial transport medium  Result Value Ref Range Status   SARS Coronavirus 2 by RT PCR POSITIVE (A) NEGATIVE Final    Comment: RESULT CALLED TO, READ BACK BY AND VERIFIED  WITH: FRANKLIN C. 11/15/2020 @ 1317 BY MECIAL J. (NOTE) SARS-CoV-2 target nucleic acids are DETECTED.  The SARS-CoV-2 RNA is generally detectable in upper respiratory specimens during the acute phase of infection. Positive results are indicative of the presence of the identified virus, but do not rule out bacterial infection or co-infection with other pathogens not detected by the test. Clinical correlation with patient history and other diagnostic information is necessary to determine patient infection status. The expected result is Negative.  Fact Sheet for Patients: EntrepreneurPulse.com.au  Fact Sheet for Healthcare Providers: IncredibleEmployment.be  This test is not yet approved or cleared by the [place] FDA and  has been authorized for detection and/or diagnosis of SARS-CoV-2 by FDA under an Emergency Use Authorization (EUA).  This EUA will remain in effect (meaning this te st can  be used) for the duration of  the COVID-19 declaration under Section 564(b)(1) of the Act, 21 U.S.C. section 360bbb-3(b)(1), unless the authorization is terminated or revoked sooner.     Influenza A by PCR NEGATIVE NEGATIVE Final   Influenza B by PCR NEGATIVE NEGATIVE Final    Comment: (NOTE) The Xpert Xpress SARS-CoV-2/FLU/RSV plus assay is intended as an aid in the diagnosis of influenza from Nasopharyngeal swab specimens and should not be used as a sole basis for treatment. Nasal washings and aspirates are unacceptable for Xpert Xpress SARS-CoV-2/FLU/RSV testing.  Fact Sheet for Patients: EntrepreneurPulse.com.au  Fact Sheet for Healthcare Providers: IncredibleEmployment.be  This test is not yet approved or cleared by the [place] FDA and has been authorized for detection and/or diagnosis of SARS-CoV-2 by FDA under an Emergency Use Authorization (EUA). This EUA will remain in effect (meaning this test can  be used) for the duration of the COVID-19 declaration under Section 564(b)(1) of the Act, 21 U.S.C. section 360bbb-3(b)(1), unless the authorization is terminated or revoked.  Performed at [facility], [place] [address] [place], [place] [address]      Labs: BNP (last 3 results) Recent Labs    09/22/20 1340 11/15/20 1030 11/20/20 0316  BNP 158.9* 132.3* 123456*   Basic Metabolic Panel: Recent Labs  Lab 11/16/20 0324 11/16/20 2103 11/17/20 0400 11/19/20 0355 11/20/20 0316  NA 140 144 142 139 138  K 4.4 3.0* 4.6 4.5 3.9  CL 104 114* 107 103 103  CO2 '26 24 26 29 28  '$ GLUCOSE 144* 85 135* 95 103*  BUN 28* 27* 29* 34* 33*  CREATININE 0.86 0.77 0.81 0.91 0.90  CALCIUM 9.9 7.7* 9.6 9.6 9.0  MG 2.3  --  2.3  --  2.1  PHOS  --   --  4.2  --  3.8   Liver Function Tests: Recent Labs  Lab 11/15/20 1030 11/16/20 0324 11/16/20 2103 11/20/20 0316  AST '21 24 19  '$ --   ALT '18 18 15  '$ --   ALKPHOS 71 69 49  --   BILITOT 0.5 0.5 0.5  --   PROT 6.8 6.7 4.9*  --   ALBUMIN 4.1 3.9 2.9* 3.2*   No results for input(s): LIPASE, AMYLASE in the last 168 hours. No results for input(s): AMMONIA in the last 168 hours. CBC: Recent Labs  Lab 11/15/20 1030 11/16/20 0324 11/17/20 0400 11/18/20 0646 11/19/20 0355 11/20/20 0316  WBC 19.6* 16.0* 21.6* 15.4* 16.8* 13.9*  NEUTROABS 10.0*  --   --   --   --   --   HGB 14.0 13.7 13.9 13.4 14.7 13.6  HCT 44.9 42.9 43.2 41.4 46.2* 41.1  MCV 97.6 96.0 96.4 97.0 96.3 94.5  PLT 413* 365 354 348 382 351   Cardiac Enzymes: No results for input(s): CKTOTAL, CKMB, CKMBINDEX, TROPONINI in the last 168 hours. BNP: Invalid input(s): POCBNP CBG: No results for input(s): GLUCAP in the last 168 hours. D-Dimer No results for input(s): DDIMER in the last 72 hours. Hgb A1c No results for input(s): HGBA1C in the last 72 hours. Lipid Profile No results for input(s): CHOL, HDL, LDLCALC, TRIG, CHOLHDL, LDLDIRECT in the last 72  hours. Thyroid function studies No results for input(s): TSH, T4TOTAL, T3FREE, THYROIDAB in the last 72 hours.  Invalid input(s): FREET3 Anemia work up No results for input(s): VITAMINB12, FOLATE, FERRITIN, TIBC, IRON, RETICCTPCT in the last 72 hours. Urinalysis    Component Value Date/Time   COLORURINE yellow 02/26/2010  [place] 02/26/2010 0951   LABSPEC 1.025 02/26/2010 0951   PHURINE 5.0 02/26/2010 0951   HGBUR trace-lysed 02/26/2010 0951   BILIRUBINUR Negative 01/14/2018 0859   PROTEINUR Negative 01/14/2018 0859   UROBILINOGEN 0.2 01/14/2018 0859   UROBILINOGEN 0.2 02/26/2010 0951   NITRITE Negative 01/14/2018 0859   NITRITE negative 02/26/2010 0951   LEUKOCYTESUR Negative 01/14/2018 0859   Sepsis Labs Invalid input(s): PROCALCITONIN,  WBC,  LACTICIDVEN Microbiology Recent Results (from the past 240 hour(s))  Resp Panel by RT-PCR (Flu A&B, Covid) Nasopharyngeal Swab     Status: Abnormal   Collection Time: 11/15/20 10:33 AM   Specimen: Nasopharyngeal Swab; Nasopharyngeal(NP) swabs in vial transport medium  Result Value Ref Range Status   SARS Coronavirus 2 by RT PCR POSITIVE (A) NEGATIVE Final    Comment: RESULT CALLED TO, READ BACK BY AND VERIFIED WITH: FRANKLIN C. 11/15/2020 @ 1317 BY MECIAL J. (NOTE) SARS-CoV-2 target nucleic acids are DETECTED.  The SARS-CoV-2 RNA is generally detectable in upper respiratory specimens during the acute phase of infection. Positive results are indicative of the presence of the identified virus, but do not rule out bacterial infection or co-infection with other pathogens not detected by the test. Clinical correlation with patient history and other diagnostic information is necessary to determine patient infection status. The expected result is Negative.  Fact Sheet for Patients: EntrepreneurPulse.com.au  Fact Sheet for Healthcare Providers: IncredibleEmployment.be  This test is  not yet approved or cleared by the [place] FDA and  has been authorized for detection and/or diagnosis of SARS-CoV-2 by FDA under an Emergency Use Authorization (EUA).  This EUA will remain in effect (meaning this te st can be used) for the duration of  the COVID-19 declaration under Section 564(b)(1) of the Act, 21 U.S.C. section 360bbb-3(b)(1), unless the authorization is terminated or revoked sooner.     Influenza A by PCR NEGATIVE NEGATIVE Final   Influenza B by PCR NEGATIVE NEGATIVE Final    Comment: (NOTE) The Xpert Xpress SARS-CoV-2/FLU/RSV plus assay is intended as an aid in the diagnosis of influenza from Nasopharyngeal swab specimens and should not be used as a sole basis for treatment. Nasal washings and aspirates are unacceptable for Xpert Xpress SARS-CoV-2/FLU/RSV testing.  Fact Sheet for Patients: EntrepreneurPulse.com.au  Fact Sheet for Healthcare Providers: IncredibleEmployment.be  This test is not yet approved or cleared by the [place] FDA and has been authorized for detection and/or diagnosis of SARS-CoV-2 by FDA under an Emergency Use Authorization (EUA). This EUA will remain in effect (meaning this test can be used) for the duration of the COVID-19 declaration under Section 564(b)(1) of the Act, 21 U.S.C. section 360bbb-3(b)(1), unless the authorization is terminated or revoked.  Performed at [facility], [place] [address] [place], [place] [address]      Time coordinating discharge: Over 30 minutes  SIGNED:   Phillips Climes, MD  [facility] 11/20/2020, 11:48 AM Pager   If 7PM-7AM, please contact night-coverage [email or web address] Password [email or web address]

## 2020-11-20 NOTE — Consult Note (Signed)
   [facility] Inpatient Consult   11/20/2020  Kim [name] [birth year] [id number]  [facility] [[facility]] Patient:  Patient was screened for [place] [facility] services. Patient will have the transition of care call conducted by the primary care provider. This patient is also in an Embedded practice which has a chronic disease management Embedded Care Management team.  Patient is active in Chronic Care Management team  Plan: Notification to be sent to the [facility] pharmacist and made aware of above needs.   Please contact for further questions,  Natividad Brood, RN BSN [facility] Liaison  [phone] business mobile phone Toll free office [phone]  Fax number: [phone] [email or web address]

## 2020-11-23 ENCOUNTER — Telehealth: Payer: Self-pay | Admitting: Adult Health

## 2020-11-23 ENCOUNTER — Ambulatory Visit: Payer: Medicare Other

## 2020-11-23 NOTE — Chronic Care Management (AMB) (Signed)
  Chronic Care Management   Note  11/23/2020 Name: REENIE KOY MRN: [id number] DOB: 1955/11/05  EMMASOPHIA SCIOSCIA is a 65 y.o. year old female who is a primary care patient of [name], NP. I reached out to [name] by phone today in response to a referral sent by Ms. Barrie Folk Houdek's PCP, [name], NP.   Ms. Lary was given information about Chronic Care Management services today including:  CCM service includes personalized support from designated clinical staff supervised by her physician, including individualized plan of care and coordination with other care providers 24/7 contact phone numbers for assistance for urgent and routine care needs. Service will only be billed when office clinical staff spend 20 minutes or more in a month to coordinate care. Only one practitioner may furnish and bill the service in a calendar month. The patient may stop CCM services at any time (effective at the end of the month) by phone call to the office staff.   Patient wishes to consider information provided and/or speak with a member of the care team before deciding about enrollment in care management services.   Follow up plan:pt had to miss apt due to medical issues she will go to her cardiologist and call us back to r/s if need to   [employer]

## 2020-11-30 ENCOUNTER — Ambulatory Visit (HOSPITAL_COMMUNITY): Payer: Medicare Other

## 2020-12-06 ENCOUNTER — Telehealth: Payer: Self-pay | Admitting: Primary Care

## 2020-12-06 ENCOUNTER — Ambulatory Visit (INDEPENDENT_AMBULATORY_CARE_PROVIDER_SITE_OTHER): Payer: Medicare Other | Admitting: Primary Care

## 2020-12-06 ENCOUNTER — Other Ambulatory Visit: Payer: Self-pay

## 2020-12-06 ENCOUNTER — Encounter: Payer: Self-pay | Admitting: Primary Care

## 2020-12-06 DIAGNOSIS — J9611 Chronic respiratory failure with hypoxia: Secondary | ICD-10-CM | POA: Diagnosis not present

## 2020-12-06 DIAGNOSIS — J449 Chronic obstructive pulmonary disease, unspecified: Secondary | ICD-10-CM | POA: Diagnosis not present

## 2020-12-06 MED ORDER — IPRATROPIUM-ALBUTEROL 0.5-2.5 (3) MG/3ML IN SOLN: RESPIRATORY_TRACT | 5 refills | Status: DC

## 2020-12-06 MED ORDER — DALIRESP 250 MCG PO TABS: 1.0000 | ORAL_TABLET | Freq: Every day | ORAL | 0 refills | Status: DC

## 2020-12-06 MED ORDER — SPIRIVA RESPIMAT 2.5 MCG/ACT IN AERS: 2.0000 | INHALATION_SPRAY | Freq: Every day | RESPIRATORY_TRACT | 5 refills | Status: DC

## 2020-12-06 MED ORDER — BUDESONIDE-FORMOTEROL FUMARATE 160-4.5 MCG/ACT IN AERO: 2.0000 | INHALATION_SPRAY | Freq: Two times a day (BID) | RESPIRATORY_TRACT | 6 refills | Status: DC

## 2020-12-06 MED ORDER — PREDNISONE 10 MG PO TABS: ORAL_TABLET | ORAL | 0 refills | Status: DC

## 2020-12-06 NOTE — Telephone Encounter (Signed)
The process has changed for patients having a PCR test performed. Patients now need to go to [facility] (drive up covid testing site) at address [address]. [place], [place] [address].  The testing site hours are from 8-3.  Called and spoke with pt and provided her with this information. Once pt does have the covid test performed, we will be able to see the results in her chart under lab tab.  Routing to [employer] as an Pharmacist, hospital.

## 2020-12-06 NOTE — Assessment & Plan Note (Signed)
Stage 3 COPD with recurrent exacerbations. She was hospitalized for 5 days in August d/t COPD exacerbation and stress induced cardiomyopathy. Treated with doxycycline and steroids. Her breathing is back to baseline. Reports voice hoarseness with [employer]. Recommend resuming Symb 160 +Spriiva respimat. Discussed adding Roflumilast to help decrease flare ups and she is willing to try. We will send in RX Daliresp 224mg x 4 weeks and if tolerates increase to 50718m daily. FU in 4 weeks with either Dr. AlElsworth Sohor BeEustaquio Maizep  Recommendations: - Resume Symbicort 160 and Spiriva Respimat 2.18m78m(Stop Breztri) - [employer] ask what formulary ICS/LABA (example Symbicort) and LAMA  (example Spiriva) inhalers are on your plan  - Start Daliresp 250m11m 4 week; if tolerating will increase to 500mc67mContinue mucinex twice daily  - Start flonase nasal spray daily/ and you can use ocean (saline) nasal spray twice daily as needed for congestion

## 2020-12-06 NOTE — Progress Notes (Signed)
$'@Patient'J$  ID: [name], female    DOB: 1955/10/02, 65 y.o.   MRN: [id number]  Chief Complaint  Patient presents with   [facility] f/u from 2wks ago    Referring provider: [name], NP  HPI: 65 year old female, former smoker quit in 2015 (76-pack-year history).  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  Patient of Dr. Elsworth Soho, last seen in office on 08/10/2020.  Hospital course 11/15/20-11/20/20 Brief/Interim Summary:  COPD with acute exacerbation: Improved.  -Completed steroid course.   -treated with doxycycline, scheduled and as needed inhalers -Incentive spirometry/OOB/PT/OT   Stress-induced cardiomyopathy/Takotsubo/ Acute systolic heart failure/Type II myocardial infarction secondary to cardiomyopathy.  Not true ACS Elevated troponin, EF 30 to 35% -Cardiology, on low-dose beta-blockers, as well started on low-dose Entresto, plan to repeat echo as an outpatient per cardiology.  No room in her blood pressure to add Aldactone at this point. - Similar to prior episode a few years back. - Continue with beta-blocker, very low-dose at this point. -Left cardiac cath this admission significant for Mild plaquing of the proximal LAD with minimal luminal narrowing.  Otherwise, no angiographically significant coronary artery disease.  Findings are similar to prior catheterization in 2017 and presumably due to stress-induced cardiomyopathy. -Try to avoid heavy exertional activity over the next few weeks.  She may walk, go up and down stairs without difficulty.  COVID-19 test positive. review of her chart reveals that she tested positive in 09/2020.  Patient should not have been tested within 90 days of positive test.  I do not think he  current cardiopulmonary symptoms are related to COVID-19 infection.  It is unlikely for her to have another COVID infection within 2 months. -I do not think isolation precaution is indicated   Discharge Diagnoses:  Principal Problem:   COPD with acute exacerbation ([place]) Active Problems:   HLD (hyperlipidemia)   Chronic diastolic CHF (congestive heart failure) ([place])   Essential hypertension   NSTEMI (non-ST elevated myocardial infarction) ([place])     12/06/2020- Interim hx  Patient presents today for hospital follow-up.  She was admitted from 11/15/2020-11/20/2020 for COPD with acute exacerbation and stress-induced cardiomyopathy/acute systolic heart failure/type II myocardial infarction secondary to cardiomyopathy. From a pulmonary standpoint she was treated with steroid course, doxycycline and scheduled bronchodilators.  She is doing well today. Her breathing is stable. She has some upper airway congestion with clear mucus. Since April/May she has had recurrent exacerbations of her COPD.  She would like to come off breztri, states it causes voice hoarseness. She did better when on Symbicort 160 + Spiriva. She has repeat echocardiogram on 12/19/20 and fu with cardiology on 12/21/20. Denies acute shortness of breath, cough, chest tightness or wheezing.    COPD exacerbations: June 2020- COPD exacerbation/Prednisone + zpack September- prednisone PCP December 2020- Prednisone + zpack ? PCP January 18th 2021- COPD exacerbation/Prednisone + doxycycline  February 5th 2021- COPD exacerbation/Prednisone + Zpack  February 9th 2021- Prolonged COPD exacerbation requiring high dose prednisone/ CXR ordered  April 4th- Hospitalized for COPD exacerbation treated with IV steriods and oral prednisone taper 08/12/2019 - Zpack and extrended prednisone taper  09/17/19- ED, given Doxycycline which was changed to [facility]. [place] held. Started on prednisone '20mg'$ , taper over 1-2 months.  September 06, 2019- Started on  Daliresp 262mg, mild effects wants to continue  12/14/19-  COPD exacerbation treated telephonically with doxycyline and prednsione 03/24/20- Hospitalized for 10 days d/t COPD exacerbation 07/17/20- COPD exacerbation treated by PCP with zpack and prednisone  08/20/20- Covid-19/COPD exacerbation. Received prednisone on 5/20 09/19/20 ED evaluation for COPD exacerbation  10/02/20 ED evaluation for COPD exacerbation treated with Doxy and pred '40mg'$  x 5 days 11/15/20 Hospitalized for 5 days COPD exacerbation and stress-induced cardiomyopathy    Allergies  Allergen Reactions   Tape Other (See Comments)    SKIN TEARS EASILY!!!!   Ciprofloxacin Other (See Comments)    Hallucinations    Lisinopril Cough   Penicillins Hives and Other (See Comments)    Tolerates Omnicef Has patient had a PCN reaction causing immediate rash, facial/tongue/throat swelling, SOB or lightheadedness with hypotension: Yes Has patient had a PCN reaction causing severe rash involving mucus membranes or skin necrosis: No Has patient had a PCN reaction that required hospitalization No Has patient had a PCN reaction occurring within the last 10 years: No If all of the above answers are "NO", then may proceed with Cephalosporin use.     Immunization History  Administered Date(s) Administered   Influenza Split 01/31/2011, 01/27/2012, 03/25/2014   Influenza Whole 01/05/2008, 03/12/2009, 03/13/2010   Influenza,inj,Quad PF,6+ Mos 12/29/2012, 12/20/2014, 01/07/2017, 12/17/2017, 12/17/2018, 01/09/2020   Influenza-Unspecified 01/31/2011, 01/27/2012, 12/29/2012, 03/25/2014, 12/20/2014, 02/11/2016, 01/07/2017, 12/17/2017, 12/17/2018   PFIZER(Purple Top)SARS-COV-2 Vaccination 06/29/2019, 07/25/2019   Pneumococcal Conjugate-13 08/10/2015   Pneumococcal Polysaccharide-23 05/08/2006, 03/22/2020   Tdap 01/31/2011    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   COPD 08/31/2007   DEPRESSION 11/19/2006   DYSLIPIDEMIA  03/12/2009   MI (mitral incompetence)    NEPHROLITHIASIS 01/05/2008   OSTEOARTHRITIS 06/05/2009   PARESTHESIA 08/24/2007   TOBACCO ABUSE 01/25/2010    Tobacco History: Social History   Tobacco Use  Smoking Status Former   Packs/day: 2.00   Years: 38.00   Pack years: 76.00   Types: Cigarettes   Quit date: 03/07/2014   Years since quitting: 6.7  Smokeless Tobacco Never   Counseling given: Not Answered   Outpatient Medications Prior to Visit  Medication Sig Dispense Refill   albuterol (VENTOLIN HFA) 108 (90 Base) MCG/ACT inhaler Take two puffs every 4-6 hours as needed for shortness of breath/wheezing . 90 day supply (Patient taking differently: Inhale 2 puffs into the lungs every 4 (four) hours as needed for shortness of breath or wheezing.) 162 g 1   ALPRAZolam (XANAX) 0.5 MG tablet TAKE 1/2 TO 1 TABLET BY MOUTH THREE TIMES DAILY (Patient taking differently: Take 0.25-0.5 mg by mouth every 6 (six) hours.) 60 tablet 2   aspirin EC 81 MG EC tablet Take 1 tablet (81 mg total) by mouth daily.     cetirizine (ZYRTEC) 10 MG tablet Take 10 mg by mouth daily.     furosemide (LASIX) 40 MG tablet Take 1 tablet (40 mg total) by mouth daily as needed (for an overnight weight gain of 3-5 pounds of fluid).     HYDROcodone-acetaminophen (NORCO) 7.5-325 MG tablet Take 1 tablet by mouth every 6 (six) hours as needed for moderate pain. 45 tablet 0   magnesium oxide (MAG-OX) 400 MG tablet Take 400 mg by mouth daily.     metoprolol succinate (TOPROL-XL) 25 MG 24 hr tablet Take 0.5 tablets (12.5 mg total) by mouth daily. 30 tablet 0   MUCINEX MAXIMUM STRENGTH 1200 MG TB12 Take 1,200 mg by mouth every 12 (twelve) hours.     nitroGLYCERIN (NITROSTAT) 0.4 MG  SL tablet Place 1 tablet (0.4 mg total) under the tongue every 5 (five) minutes as needed for chest pain. 25 tablet 3   nystatin (MYCOSTATIN) 100000 UNIT/ML suspension Take 5 mLs (500,000 Units total) by mouth 4 (four) times daily as needed (for thrush).      OXYGEN Inhale 2 L/min into the lungs continuous.     PARoxetine (PAXIL) 30 MG tablet TAKE 1 TABLET EVERY DAY (Patient taking differently: Take 30 mg by mouth in the morning.) 90 tablet 1   rosuvastatin (CRESTOR) 20 MG tablet Take 1 tablet (20 mg total) by mouth daily. 90 tablet 3   Spacer/Aero Chamber Mouthpiece MISC 1 Device by Does not apply route as directed. 1 each 0   ipratropium-albuterol (DUONEB) 0.5-2.5 (3) MG/3ML SOLN USE 3 ML VIA NEBULIZER EVERY 6 HOURS AS NEEDED.  DX: J44.9 (Patient taking differently: Take 3 mLs by nebulization every 6 (six) hours as needed (for shortness of breath or wheezing- DX: J44.9).) 360 mL 5   acetaminophen (TYLENOL) 650 MG suppository Place 1 suppository (650 mg total) rectally every 6 (six) hours as needed for mild pain (or Fever >/= 101). 12 suppository 0   sacubitril-valsartan (ENTRESTO) 24-26 MG Take 1 tablet by mouth 2 (two) times daily. (Patient not taking: Reported on 12/06/2020) 60 tablet 0   Budeson-Glycopyrrol-Formoterol (BREZTRI AEROSPHERE) 160-9-4.8 MCG/ACT AERO Inhale 2 puffs into the lungs in the morning and at bedtime. (Patient not taking: Reported on 12/06/2020) 32.1 g 3   Facility-Administered Medications Prior to Visit  Medication Dose Route Frequency Provider Last Rate Last Admin   0.9 %  sodium chloride infusion   Intravenous PRN Pickenpack-Cousar, Carlena Sax, NP        Review of Systems  Review of Systems  Constitutional: Negative.   HENT:  Positive for postnasal drip.   Respiratory:  Negative for cough, chest tightness, shortness of breath and wheezing.   Cardiovascular: Negative.  Negative for chest pain and leg swelling.    Physical Exam  BP 116/76 (BP Location: Left Arm, Patient Position: Sitting, Cuff Size: Normal)   Pulse 97   Temp 98.1 F (36.7 C) (Oral)   Ht '5\' 3"'$  (1.6 m)   Wt 127 lb 12.8 oz (58 kg)   SpO2 93%   BMI 22.64 kg/m  Physical Exam Constitutional:      Appearance: Normal appearance.  HENT:     Head:  Normocephalic and atraumatic.  Cardiovascular:     Rate and Rhythm: Normal rate and regular rhythm.  Pulmonary:     Effort: Pulmonary effort is normal.     Breath sounds: Normal breath sounds. No wheezing.     Comments: Diminished Neurological:     General: No focal deficit present.     Mental Status: She is alert and oriented to person, place, and time. Mental status is at baseline.  Psychiatric:        Mood and Affect: Mood normal.        Behavior: Behavior normal.        Thought Content: Thought content normal.        Judgment: Judgment normal.     Lab Results:  CBC    Component Value Date/Time   WBC 13.9 (H) 11/20/2020 0316   RBC 4.35 11/20/2020 0316   HGB 13.6 11/20/2020 0316   HGB 14.0 08/26/2016 1058   HCT 41.1 11/20/2020 0316   HCT 42.6 08/26/2016 1058   PLT 351 11/20/2020 0316   PLT 297 08/26/2016 1058   MCV  94.5 11/20/2020 0316   MCV 92 08/26/2016 1058   MCH 31.3 11/20/2020 0316   MCHC 33.1 11/20/2020 0316   RDW 13.9 11/20/2020 0316   RDW 14.1 08/26/2016 1058   LYMPHSABS 8.3 (H) 11/15/2020 1030   MONOABS 1.0 11/15/2020 1030   EOSABS 0.1 11/15/2020 1030   BASOSABS 0.2 (H) 11/15/2020 1030    BMET    Component Value Date/Time   NA 138 11/20/2020 0316   NA 141 10/24/2020 0907   K 3.9 11/20/2020 0316   CL 103 11/20/2020 0316   CO2 28 11/20/2020 0316   GLUCOSE 103 (H) 11/20/2020 0316   BUN 33 (H) 11/20/2020 0316   BUN 11 10/24/2020 0907   CREATININE 0.90 11/20/2020 0316   CREATININE 1.16 (H) 07/19/2015 1447   CALCIUM 9.0 11/20/2020 0316   GFRNONAA >60 11/20/2020 0316   GFRAA 85 05/31/2020 0935    BNP    Component Value Date/Time   BNP 211.4 (H) 11/20/2020 0316    ProBNP    Component Value Date/Time   PROBNP 147 05/22/2020 1404    Imaging: DG Chest 2 View  Result Date: 11/15/2020 CLINICAL DATA:  Shortness of breath. EXAM: CHEST - 2 VIEW COMPARISON:  09/22/2020, 09/19/2020 FINDINGS: The heart size and mediastinal contours are within normal  limits. Stable chronic lung disease with mild hyperinflation and diffuse interstitial prominence, not significantly changed from prior exams. No superimposed airspace opacity, pleural effusion, or pneumothorax. Redemonstrated compression deformities in the midthoracic spine IMPRESSION: Stable chronic lung disease without superimposed acute process. Electronically Signed   By: Merilyn Baba MD   On: 11/15/2020 11:19   CARDIAC CATHETERIZATION  Result Date: 11/19/2020 Conclusions: Mild plaquing of the proximal LAD with minimal luminal narrowing.  Otherwise, no angiographically significant coronary artery disease.  Findings are similar to prior catheterization in 2017 and presumably due to stress-induced cardiomyopathy. Recommendations: Medical therapy and risk factor modification to prevent progression of coronary artery disease. Escalate goal-directed medical therapy for treatment of non-ischemic cardiomyopathy (presumably stress-induced). [name], MD [facility]  ECHOCARDIOGRAM COMPLETE  Result Date: 11/17/2020    ECHOCARDIOGRAM REPORT   Patient Name:   BIANKA SUNDERMEYER Date of Exam: 11/17/2020 Medical Rec #:  [id number]       Height:       62.0 in Accession #:    [id number]      Weight:       130.0 lb Date of Birth:  09/11/1955      BSA:          1.592 m Patient Age:    29 years        BP:           142/80 mmHg Patient Gender: F               HR:           83 bpm. Exam Location:  Inpatient Procedure: 2D Echo, Intracardiac Opacification Agent, 3D Echo, Strain Analysis,            Cardiac Doppler and Color Doppler Indications:    NSTEMI I21.4  History:        Patient has prior history of Echocardiogram examinations, most                 recent 07/11/2019. Previous Myocardial Infarction, COPD; Risk                 Factors:Hypertension, Dyslipidemia and Former Smoker. COVID 19.  Sonographer:    Darlina Sicilian RDCS  Referring Phys: [id number] [place]  1. Akinesis of the entire apex with  overall moderate to severe LV dysfunction; no apical thrombus noted using definity; consider LAD infarct vs takotsubo cardiomyopathy.  2. Left ventricular ejection fraction, by estimation, is 30 to 35%. The left ventricle has moderate to severely decreased function. The left ventricle demonstrates regional wall motion abnormalities (see scoring diagram/findings for description). Left ventricular diastolic parameters are consistent with Grade I diastolic dysfunction (impaired relaxation). Elevated left atrial pressure. The average left ventricular global longitudinal strain is -7.9 %. The global longitudinal strain is abnormal.  3. Right ventricular systolic function is normal. The right ventricular size is normal.  4. The mitral valve is normal in structure. No evidence of mitral valve regurgitation. No evidence of mitral stenosis.  5. The aortic valve is tricuspid. Aortic valve regurgitation is mild. Mild aortic valve sclerosis is present, with no evidence of aortic valve stenosis.  6. The inferior vena cava is normal in size with greater than 50% respiratory variability, suggesting right atrial pressure of 3 mmHg. FINDINGS  Left Ventricle: Left ventricular ejection fraction, by estimation, is 30 to 35%. The left ventricle has moderate to severely decreased function. The left ventricle demonstrates regional wall motion abnormalities. Definity contrast agent was given IV to delineate the left ventricular endocardial borders. The average left ventricular global longitudinal strain is -7.9 %. The global longitudinal strain is abnormal. The left ventricular internal cavity size was normal in size. There is no left ventricular hypertrophy. Left ventricular diastolic parameters are consistent with Grade I diastolic dysfunction (impaired relaxation). Elevated left atrial pressure. Right Ventricle: The right ventricular size is normal. Right ventricular systolic function is normal. Left Atrium: Left atrial size was normal  in size. Right Atrium: Right atrial size was normal in size. Pericardium: There is no evidence of pericardial effusion. Mitral Valve: The mitral valve is normal in structure. No evidence of mitral valve regurgitation. No evidence of mitral valve stenosis. Tricuspid Valve: The tricuspid valve is normal in structure. Tricuspid valve regurgitation is not demonstrated. No evidence of tricuspid stenosis. Aortic Valve: The aortic valve is tricuspid. Aortic valve regurgitation is mild. Aortic regurgitation PHT measures 313 msec. Mild aortic valve sclerosis is present, with no evidence of aortic valve stenosis. Pulmonic Valve: The pulmonic valve was not well visualized. Pulmonic valve regurgitation is not visualized. No evidence of pulmonic stenosis. Aorta: The aortic root is normal in size and structure. Venous: The inferior vena cava is normal in size with greater than 50% respiratory variability, suggesting right atrial pressure of 3 mmHg. IAS/Shunts: No atrial level shunt detected by color flow Doppler. Additional Comments: Akinesis of the entire apex with overall moderate to severe LV dysfunction; no apical thrombus noted using definity; consider LAD infarct vs takotsubo cardiomyopathy.  LEFT VENTRICLE PLAX 2D LVIDd:         4.50 cm     Diastology LVIDs:         2.30 cm     LV e' medial:    3.25 cm/s LV PW:         0.70 cm     LV E/e' medial:  19.6 LV IVS:        0.80 cm     LV e' lateral:   4.95 cm/s LVOT diam:     2.00 cm     LV E/e' lateral: 12.9 LV SV:         51 LV SV Index:   32  2D Longitudinal Strain LVOT Area:     3.14 cm    2D Strain GLS Avg:     -7.9 %  LV Volumes (MOD) LV vol d, MOD A2C: 95.3 ml 3D Volume EF: LV vol d, MOD A4C: 89.5 ml 3D EF:        43 % LV vol s, MOD A2C: 51.4 ml LV EDV:       154 ml LV vol s, MOD A4C: 46.9 ml LV ESV:       87 ml LV SV MOD A2C:     44.0 ml LV SV:        67 ml LV SV MOD A4C:     89.5 ml LV SV MOD BP:      42.7 ml RIGHT VENTRICLE RV S prime:     9.75 cm/s TAPSE  (M-mode): 1.5 cm LEFT ATRIUM             Index LA diam:        2.50 cm 1.57 cm/m LA Vol (A2C):   26.6 ml 16.71 ml/m LA Vol (A4C):   25.7 ml 16.14 ml/m LA Biplane Vol: 27.5 ml 17.28 ml/m  AORTIC VALVE LVOT Vmax:   104.00 cm/s LVOT Vmean:  57.500 cm/s LVOT VTI:    0.162 m AI PHT:      313 msec  AORTA Ao Root diam: 3.20 cm Ao Asc diam:  3.20 cm MITRAL VALVE MV Area (PHT): 3.60 cm    SHUNTS MV Decel Time: 211 msec    Systemic VTI:  0.16 m MV E velocity: 63.70 cm/s  Systemic Diam: 2.00 cm MV A velocity: 80.20 cm/s MV E/A ratio:  0.79 Kirk Ruths MD Electronically signed by Kirk Ruths MD Signature Date/Time: 11/17/2020/10:14:44 AM    Final      Assessment & Plan:   COPD GOLD III  Stage 3 COPD with recurrent exacerbations. She was hospitalized for 5 days in August d/t COPD exacerbation and stress induced cardiomyopathy. Treated with doxycycline and steroids. Her breathing is back to baseline. Reports voice hoarseness with [employer]. Recommend resuming Symb 160 +Spriiva respimat. Discussed adding Roflumilast to help decrease flare ups and she is willing to try. We will send in RX Daliresp 252mg x 4 weeks and if tolerates increase to 5064m daily. FU in 4 weeks with either Dr. AlElsworth Sohor BeEustaquio Maizep  Recommendations: - Resume Symbicort 160 and Spiriva Respimat 2.89m76m(Stop Breztri) - [employer] ask what formulary ICS/LABA (example Symbicort) and LAMA  (example Spiriva) inhalers are on your plan  - Start Daliresp 250m94m 4 week; if tolerating will increase to 500mc10mContinue mucinex twice daily  - Start flonase nasal spray daily/ and you can use ocean (saline) nasal spray twice daily as needed for congestion   Chronic respiratory failure (HCC) - Continues to benefit from supplemental oxygen, using 2L  O2 93%   40 mins spent on case, >50% face to face with patient   ElizaMartyn Ehrich9/04/2020

## 2020-12-06 NOTE — Assessment & Plan Note (Signed)
-   Continues to benefit from supplemental oxygen, using 2L [place] O2 93%

## 2020-12-06 NOTE — Telephone Encounter (Signed)
Can you order PCR covid test for patient, hx covid in May

## 2020-12-06 NOTE — Patient Instructions (Addendum)
Recommendations: - Resume SYMBICORT and SPIRIVA (Stop Breztri) - [employer] and ask what formulary ICS/LABA (example Symbicort) and LAMA  (example Spiriva) inhalers are on your plan  - Start Daliresp 221mg x 4 week; if tolerating will increase to 5040m - Continue mucinex twice daily  - Start flonase nasal spray daily/ and you can use ocean (saline) nasal spray twice daily as needed for congestion  - You can contact [facility] about inhaler donations Attn: TrWannetta Senderphone # [phone] [address]. [address]  Rx: Ipratropium-albuterol nebulizer refilled  Orders: Please given patient patient assistance paper work for Symbicort   Follow-up: 4 weeks with Dr. AlElsworth Sohor BeEustaquio MaizeP      Roflumilast oral tablets What is this medication? ROFLUMILAST (roe FLUE mi last) decreases inflammation in the lungs. This medicine is used to prevent COPD flare-ups. Do not use this medicine to treat sudden breathing problems. This medicine may be used for other purposes; ask your health care provider or pharmacist if you have questions. COMMON BRAND NAME(S): Daliresp What should I tell my care team before I take this medication? They need to know if you have any of these conditions: liver disease mental illness an unusual or allergic reaction to roflumilast, other medicines, foods, dyes, or preservatives pregnant or trying to get pregnant breast-feeding How should I use this medication? Take this medicine by mouth with a glass of water. Follow the directions on the prescription label. You can take it with or without food. If it upsets your stomach, take it with food. Take your medicine at regular intervals. Do not take it more often than directed. Do not stop taking except on your doctor's advice. A special MedGuide will be given to you by the pharmacist with each prescription and refill. Be sure to read this information carefully each time. Talk to your pediatrician  regarding the use of this medicine in children. Special care may be needed. Overdosage: If you think you have taken too much of this medicine contact a poison control center or emergency room at once. NOTE: This medicine is only for you. Do not share this medicine with others. What if I miss a dose? If you miss a dose, take it as soon as you can. If it is almost time for your next dose, take only that dose. Do not take double or extra doses. What may interact with this medication? carbamazepine cimetidine enoxacin erythromycin fluvoxamine ketoconazole phenobarbital phenytoin rifampicin [facility] John's wort This list may not describe all possible interactions. Give your health care provider a list of all the medicines, herbs, non-prescription drugs, or dietary supplements you use. Also tell them if you smoke, drink alcohol, or use illegal drugs. Some items may interact with your medicine. What should I watch for while using this medication? Visit your doctor for regular check ups. Tell your doctor or healthcare professional if your symptoms do not start to get better or if they get worse. What side effects may I notice from receiving this medication? Side effects that you should report to your doctor or health care professional as soon as possible: allergic reactions like skin rash, itching or hives, swelling of the face, lips, or tongue anxious breathing problems suicidal thoughts or other mood changes trouble sleeping weight loss Side effects that usually do not require medical attention (report to your doctor or health care professional if they continue or are bothersome): back pain diarrhea dizziness general ill feeling or flu-like symptoms headache loss of appetite  nausea, vomiting This list may not describe all possible side effects. Call your doctor for medical advice about side effects. You may report side effects to FDA at 1-800-FDA-1088. Where should I keep my  medication? Keep out of the reach of children. Store at room temperature between 15 and 30 degrees C (59 and 86 degrees F). Throw away any unused medicine after the expiration date. NOTE: This sheet is a summary. It may not cover all possible information. If you have questions about this medicine, talk to your doctor, pharmacist, or health care provider.  2022 Elsevier/Gold Standard (2015-04-26 09:35:23)

## 2020-12-07 ENCOUNTER — Telehealth: Payer: Self-pay | Admitting: Primary Care

## 2020-12-07 NOTE — Telephone Encounter (Signed)
Thanks

## 2020-12-07 NOTE — Telephone Encounter (Signed)
I called and spoke with patient regarding PCR test. Patient was at [facility] location as she told by one of our staff to go there to get the PCR test but that is only for pre-procedures and there was no procedure ordered so patient needed to go to a pharmacy to get there. I did give patient the website to [place] Covid sites to try and find one that way. Patient verbalized understanding, nothing further needed.

## 2020-12-19 ENCOUNTER — Ambulatory Visit (HOSPITAL_COMMUNITY): Payer: Medicare Other | Attending: Cardiovascular Disease

## 2020-12-19 ENCOUNTER — Other Ambulatory Visit: Payer: Self-pay

## 2020-12-19 DIAGNOSIS — I5181 Takotsubo syndrome: Secondary | ICD-10-CM | POA: Insufficient documentation

## 2020-12-19 LAB — ECHOCARDIOGRAM COMPLETE
Area-P 1/2: 4.85 cm2
P 1/2 time: 529 msec
S' Lateral: 3.4 cm

## 2020-12-20 NOTE — Progress Notes (Signed)
Cardiology Office Note:   Date:  12/21/2020   ID:  Kim [name], DOB May 12, 1955, MRN [id number]  PCP:  Kim Peng, NP  [facility] Providers Cardiologist:  Lauree Chandler, MD      Referring MD: Kim Peng, NP   Chief Complaint:  Hospitalization Follow-up (Admitted with NSTEMI, Takotsubo CM)    Patient Profile:   Kim [name] is a 65 y.o. female with:  Coronary artery disease  Cath in 2017: min non-obs CAD Cath 8/22: no CAD  HFimpEF (heart failure with improved ejection fraction)  Non-ischemic cardiomyopathy  Cath 3/17: EF 20-25 Echocardiogram 5/18: normal EF Echocardiogram 4/21: Normal EF  Tako-tsubo CM Admx 8/22 >> +Trop, Echo: EF 30-35, apical WMA // no CAD on cath Echocardiogram 9/22: EF 55-60  COPD, home O2 Hypertension  Hyperlipidemia  Ex-smoker    Prior CV studies: Echocardiogram 12/19/20 EF 55-60, no RWMA, normal diastolic function, normal RVSF, trivial MR  LEFT HEART CATH AND CORONARY ANGIOGRAPHY 11/19/2020 Narrative Conclusions: Mild plaquing of the proximal LAD with minimal luminal narrowing.  Otherwise, no angiographically significant coronary artery disease.  Findings are similar to prior catheterization in 2017 and presumably due to stress-induced cardiomyopathy.  Recommendations: Medical therapy and risk factor modification to prevent progression of coronary artery disease. Escalate goal-directed medical therapy for treatment of non-ischemic cardiomyopathy (presumably stress-induced).   Echocardiogram 11/17/20 Akinesis of the entire apex, EF 30-35, GR 1 DD, GLS -7.9, normal RVSF  NON-TELEMETRY MONITORING HOOKUP AND INTERP 07/25/2015 Narrative Sinus rhythm with sinus bradycardia. Frequent premature atrial contractions (PACs) with pattern of trigeminy. Rare premature ventricular contractions (PVCs) No evidence of atrial fibrillation. No evidence of ventricular tachycardia   History of Present Illness: [name] was last seen  by Dr. Angelena Form in 3/22.  She was admitted 8/11-8/16 with a NSTEMI in the setting of COPD exacerbation.  EF was 30-35 by echocardiogram with apical wall motion abnormality.  Cardiac catheterization demonstrated minimal plaque but no significant CAD.  Overall picture was felt to be a type II myocardial infarction secondary to cardiomyopathy consistent with Takotsubo cardiomyopathy.  GDMT was adjusted.  Her blood pressure cannot tolerate spironolactone.  She was kept on low-dose beta-blocker and Entresto.  For her COPD exacerbation, she was treated with a combo of antibiotics, bronchodilators and steroids.  She recently had a follow-up echocardiogram on 12/19/2020 which demonstrated improved LV function with EF 55-60.  She returns for follow-up.  She is here with her husband.  She has overall been doing well since discharge from the hospital.  She did not get Entresto filled.  She went back on losartan 50 mg daily.  She does have issues with dizziness with standing up.  She has had near syncopal spells in the past.  Overall, she has been fairly stable since she was discharged from the hospital.  She has not had any further chest tightness.  She is chronically short of breath from COPD.  She remains on chronic O2.  She has not had syncope.  She likes to sleep on an angle.  She has not had significant leg edema.    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   OSTEOARTHRITIS 06/05/2009   PARESTHESIA 08/24/2007   TOBACCO ABUSE 01/25/2010   Current Medications: Current Meds  Medication Sig   albuterol (VENTOLIN HFA) 108 (90 Base) MCG/ACT inhaler Take two puffs every  4-6 hours as needed for shortness of breath/wheezing . 90 day supply (Patient taking differently: Inhale 2 puffs into the lungs every 4 (four) hours as needed for shortness of breath or wheezing.)    ALPRAZolam (XANAX) 0.5 MG tablet TAKE 1/2 TO 1 TABLET BY MOUTH THREE TIMES DAILY (Patient taking differently: Take 0.25-0.5 mg by mouth every 6 (six) hours.)   aspirin EC 81 MG EC tablet Take 1 tablet (81 mg total) by mouth daily.   budesonide-formoterol (SYMBICORT) 160-4.5 MCG/ACT inhaler Inhale 2 puffs into the lungs 2 (two) times daily.   cetirizine (ZYRTEC) 10 MG tablet Take 10 mg by mouth daily.   furosemide (LASIX) 40 MG tablet Take 1 tablet (40 mg total) by mouth daily as needed (for an overnight weight gain of 3-5 pounds of fluid).   HYDROcodone-acetaminophen (NORCO) 7.5-325 MG tablet Take 1 tablet by mouth every 6 (six) hours as needed for moderate pain.   ipratropium-albuterol (DUONEB) 0.5-2.5 (3) MG/3ML SOLN USE 3 ML VIA NEBULIZER EVERY 6 HOURS AS NEEDED.  DX: J44.9   losartan (COZAAR) 50 MG tablet Take 50 mg by mouth daily.   magnesium oxide (MAG-OX) 400 MG tablet Take 400 mg by mouth daily.   metoprolol succinate (TOPROL-XL) 25 MG 24 hr tablet Take 0.5 tablets (12.5 mg total) by mouth daily.   MUCINEX MAXIMUM STRENGTH 1200 MG TB12 Take 1,200 mg by mouth every 12 (twelve) hours.   nitroGLYCERIN (NITROSTAT) 0.4 MG SL tablet Place 1 tablet (0.4 mg total) under the tongue every 5 (five) minutes as needed for chest pain.   nystatin (MYCOSTATIN) 100000 UNIT/ML suspension Take 5 mLs (500,000 Units total) by mouth 4 (four) times daily as needed (for thrush).   OXYGEN Inhale 2 L/min into the lungs continuous.   PARoxetine (PAXIL) 30 MG tablet TAKE 1 TABLET EVERY DAY (Patient taking differently: Take 30 mg by mouth in the morning.)   predniSONE (DELTASONE) 10 MG tablet For COPD exacerbation: Take 4 tabs po daily x 3 days; then 3 tabs daily x3 days; then 2 tabs daily x3 days; then 1 tab daily x 3 days; then stop   Spacer/Aero Chamber Mouthpiece MISC 1 Device by Does not apply route as directed.   Tiotropium Bromide Monohydrate (SPIRIVA RESPIMAT) 2.5 MCG/ACT AERS Inhale 2 puffs into the lungs  daily.   Current Facility-Administered Medications for the 12/21/20 encounter (Office Visit) with Richardson Dopp T, PA-C  Medication   0.9 %  sodium chloride infusion    Allergies:   Tape, Ciprofloxacin, Lisinopril, and Penicillins   Social History   Tobacco Use   Smoking status: Former    Packs/day: 2.00    Years: 38.00    Pack years: 76.00    Types: Cigarettes    Quit date: 03/07/2014    Years since quitting: 6.7   Smokeless tobacco: Never  Vaping Use   Vaping Use: Never used  Substance Use Topics   Alcohol use: No    Alcohol/week: 0.0 standard drinks   Drug use: No    Family Hx: The patient's family history includes Alcohol abuse in an other family member; Arthritis in an other family member; Cancer in her mother and another family member; Diabetes in her sister and another family member; Heart attack in her maternal uncle, mother, and other family members; Heart disease in an other family member; Hypertension in her brother and sister; Kidney disease in an other family member; Melanoma in an other family member; Multiple sclerosis in her brother and another family  member; Osteoporosis in an other family member; Rheum arthritis in her sister; Stroke in her maternal grandmother and another family member; Thyroid disease in her sister. There is no history of Colon cancer.  Review of Systems  Gastrointestinal:  Negative for hematochezia and melena.  Genitourinary:  Negative for hematuria.    EKGs/Labs/Other Test Reviewed:    EKG:  EKG is not ordered today.  The ekg ordered today demonstrates n/a  Recent Labs: 05/22/2020: NT-Pro BNP 147 07/13/2020: TSH 1.38 11/16/2020: ALT 15 11/20/2020: B Natriuretic Peptide 211.4; BUN 33; Creatinine, Ser 0.90; Hemoglobin 13.6; Magnesium 2.1; Platelets 351; Potassium 3.9; Sodium 138   Recent Lipid Panel Lab Results  Component Value Date/Time   CHOL 206 (H) 10/18/2020 08:36 AM   CHOL 221 (H) 01/25/2018 08:11 AM   TRIG 195.0 (H) 10/18/2020 08:36  AM   HDL 50.60 10/18/2020 08:36 AM   HDL 41 01/25/2018 08:11 AM   LDLCALC 117 (H) 10/18/2020 08:36 AM   LDLCALC 151 (H) 01/25/2018 08:11 AM   LDLDIRECT 155.4 12/29/2012 12:09 PM     Risk Assessment/Calculations:          Physical Exam:    VS:  BP (!) 148/72 (BP Location: Right Arm, Patient Position: Sitting, Cuff Size: Normal)   Pulse 79   Ht '5\' 3"'$  (1.6 m)   Wt 129 lb (58.5 kg)   SpO2 96%   BMI 22.85 kg/m     Wt Readings from Last 3 Encounters:  12/21/20 129 lb (58.5 kg)  12/06/20 127 lb 12.8 oz (58 kg)  11/20/20 130 lb 1.1 oz (59 kg)    Constitutional:      Appearance: Healthy appearance. Not in distress.  Neck:     Vascular: JVD normal.  Pulmonary:     Effort: Pulmonary effort is normal.     Breath sounds: No wheezing. No rales.  Cardiovascular:     Normal rate. Regular rhythm. Normal S1. Normal S2.      Murmurs: There is no murmur.     Comments: R wrist without hematoma Edema:    Peripheral edema absent.  Abdominal:     Palpations: Abdomen is soft.  Skin:    General: Skin is warm and dry.  Neurological:     Mental Status: Alert and oriented to person, place and time.     Cranial Nerves: Cranial nerves are intact.       ASSESSMENT & PLAN:    1. HFimpEF (Heart Failure with improved EF) 2. Takotsubo cardiomyopathy She has had several episodes of stress-induced cardiomyopathy in the past.  Most recent admission with type II myocardial infarction in the setting of COPD exacerbation.  She had reduced EF at 30-35% with akinesis of the entire apex.  Echocardiogram 9/14 demonstrated improved LV function with EF 55-60.  She did not tolerate Entresto.  She is on losartan 50 mg daily in addition to metoprolol succinate 12.5 mg daily.  She has some issues with hypotension at times.  She mainly has issues with orthostasis.  Her blood pressure today is somewhat elevated but her blood pressures at home are typically optimal.  Therefore, I will continue her current  medications.  Follow-up with Dr. Angelena Form in November as planned.  3. Coronary artery disease involving native coronary artery of native heart without angina pectoris She had minimal plaque on cardiac catheterization.  There is really no significant CAD.  Continue aspirin 81 mg daily.  She has had side effects to statins in the past and stopped taking rosuvastatin  few weeks ago.  4. Essential hypertension As noted, her blood pressure is typically well controlled.  She does sometimes have issues with orthostasis.  Of asked her to continue to hydrate well and to get up slowly.  Repeat BMET today.  5. Pure hypercholesterolemia She really has no significant CAD on cardiac catheterization.  She had side effects to rosuvastatin.  We can certainly reconsider challenging her with a different statin at some point in the future.  For now, remain off of rosuvastatin.        Dispo:  Return for Keep upcoming appointment with Dr.McAlhany.   Medication Adjustments/Labs and Tests Ordered: Current medicines are reviewed at length with the patient today.  Concerns regarding medicines are outlined above.  Tests Ordered: Orders Placed This Encounter  Procedures   Basic metabolic panel   Medication Changes: No orders of the defined types were placed in this encounter.  Signed, Richardson Dopp, PA-C  12/21/2020 12:14 PM    [facility] [address], [place], [place]  [address] Phone: [phone]; Fax: [phone]

## 2020-12-21 ENCOUNTER — Ambulatory Visit (INDEPENDENT_AMBULATORY_CARE_PROVIDER_SITE_OTHER): Payer: Medicare Other | Admitting: Physician Assistant

## 2020-12-21 ENCOUNTER — Other Ambulatory Visit: Payer: Self-pay

## 2020-12-21 ENCOUNTER — Encounter: Payer: Self-pay | Admitting: Physician Assistant

## 2020-12-21 VITALS — BP 148/72 | HR 79 | Ht 63.0 in | Wt 129.0 lb

## 2020-12-21 DIAGNOSIS — I10 Essential (primary) hypertension: Secondary | ICD-10-CM

## 2020-12-21 DIAGNOSIS — I5032 Chronic diastolic (congestive) heart failure: Secondary | ICD-10-CM

## 2020-12-21 DIAGNOSIS — I2510 Atherosclerotic heart disease of native coronary artery without angina pectoris: Secondary | ICD-10-CM | POA: Diagnosis not present

## 2020-12-21 DIAGNOSIS — I5181 Takotsubo syndrome: Secondary | ICD-10-CM | POA: Diagnosis not present

## 2020-12-21 DIAGNOSIS — E7800 Pure hypercholesterolemia, unspecified: Secondary | ICD-10-CM

## 2020-12-21 NOTE — Patient Instructions (Signed)
Medication Instructions:  No changes - see medication list  *If you need a refill on your cardiac medications before your next appointment, please call your pharmacy*   Lab Work: BMET - Today   If you have labs (blood work) drawn today and your tests are completely normal, you will receive your results only by: [place] (if you have MyChart) OR A paper copy in the mail If you have any lab test that is abnormal or we need to change your treatment, we will call you to review the results.  Follow-Up: At [facility], you and your health needs are our priority.  As part of our continuing mission to provide you with exceptional heart care, we have created designated Provider Care Teams.  These Care Teams include your primary Cardiologist (physician) and Advanced Practice Providers (APPs -  Physician Assistants and Nurse Practitioners) who all work together to provide you with the care you need, when you need it.  We recommend signing up for the patient portal called "MyChart".  Sign up information is provided on this After Visit Summary.  MyChart is used to connect with patients for Virtual Visits (Telemedicine).  Patients are able to view lab/test results, encounter notes, upcoming appointments, etc.  Non-urgent messages can be sent to your provider as well.   To learn more about what you can do with MyChart, go to [email or web address].    Your next appointment:   3 month(s)  The format for your next appointment:   In Person  Provider:   Lauree Chandler, MD   Other Instructions

## 2020-12-22 LAB — BASIC METABOLIC PANEL
BUN/Creatinine Ratio: 21 (ref 12–28)
BUN: 19 mg/dL (ref 8–27)
CO2: 26 mmol/L (ref 20–29)
Calcium: 10.2 mg/dL (ref 8.7–10.3)
Chloride: 103 mmol/L (ref 96–106)
Creatinine, Ser: 0.91 mg/dL (ref 0.57–1.00)
Glucose: 89 mg/dL (ref 65–99)
Potassium: 4.2 mmol/L (ref 3.5–5.2)
Sodium: 145 mmol/L — ABNORMAL HIGH (ref 134–144)
eGFR: 70 mL/min/{1.73_m2} (ref >59)

## 2020-12-26 ENCOUNTER — Telehealth: Payer: Self-pay | Admitting: Pharmacist

## 2020-12-26 NOTE — Chronic Care Management (AMB) (Signed)
    Chronic Care Management Pharmacy Assistant   Name: Kim [name]  MRN: [id number] DOB: November 07, 1955   Reason for Encounter: Reschedule Initial Appointment with [name] per Thurmond Butts PTM.      Call to patient she reports she is not interested in rescheduling this appointment, she reports she has seen her heart doctor and had all of her medications sorted out. She would like to be Un-enrolled from CCM services.  Medications: Outpatient Encounter Medications as of 12/26/2020  Medication Sig   albuterol (VENTOLIN HFA) 108 (90 Base) MCG/ACT inhaler Take two puffs every 4-6 hours as needed for shortness of breath/wheezing . 90 day supply (Patient taking differently: Inhale 2 puffs into the lungs every 4 (four) hours as needed for shortness of breath or wheezing.)   ALPRAZolam (XANAX) 0.5 MG tablet TAKE 1/2 TO 1 TABLET BY MOUTH THREE TIMES DAILY (Patient taking differently: Take 0.25-0.5 mg by mouth every 6 (six) hours.)   aspirin EC 81 MG EC tablet Take 1 tablet (81 mg total) by mouth daily.   budesonide-formoterol (SYMBICORT) 160-4.5 MCG/ACT inhaler Inhale 2 puffs into the lungs 2 (two) times daily.   cetirizine (ZYRTEC) 10 MG tablet Take 10 mg by mouth daily.   furosemide (LASIX) 40 MG tablet Take 1 tablet (40 mg total) by mouth daily as needed (for an overnight weight gain of 3-5 pounds of fluid).   HYDROcodone-acetaminophen (NORCO) 7.5-325 MG tablet Take 1 tablet by mouth every 6 (six) hours as needed for moderate pain.   ipratropium-albuterol (DUONEB) 0.5-2.5 (3) MG/3ML SOLN USE 3 ML VIA NEBULIZER EVERY 6 HOURS AS NEEDED.  DX: J44.9   losartan (COZAAR) 50 MG tablet Take 50 mg by mouth daily.   magnesium oxide (MAG-OX) 400 MG tablet Take 400 mg by mouth daily.   metoprolol succinate (TOPROL-XL) 25 MG 24 hr tablet Take 0.5 tablets (12.5 mg total) by mouth daily.   MUCINEX MAXIMUM STRENGTH 1200 MG TB12 Take 1,200 mg by mouth every 12 (twelve) hours.   nitroGLYCERIN  (NITROSTAT) 0.4 MG SL tablet Place 1 tablet (0.4 mg total) under the tongue every 5 (five) minutes as needed for chest pain.   nystatin (MYCOSTATIN) 100000 UNIT/ML suspension Take 5 mLs (500,000 Units total) by mouth 4 (four) times daily as needed (for thrush).   OXYGEN Inhale 2 L/min into the lungs continuous.   PARoxetine (PAXIL) 30 MG tablet TAKE 1 TABLET EVERY DAY (Patient taking differently: Take 30 mg by mouth in the morning.)   predniSONE (DELTASONE) 10 MG tablet For COPD exacerbation: Take 4 tabs po daily x 3 days; then 3 tabs daily x3 days; then 2 tabs daily x3 days; then 1 tab daily x 3 days; then stop   Spacer/Aero Chamber Mouthpiece MISC 1 Device by Does not apply route as directed.   Tiotropium Bromide Monohydrate (SPIRIVA RESPIMAT) 2.5 MCG/ACT AERS Inhale 2 puffs into the lungs daily.   Facility-Administered Encounter Medications as of 12/26/2020  Medication   0.9 %  sodium chloride infusion    Care Gaps: Mammogram - Overdue DEXA - Overdue  AWV - Last 2021  Star Rating Drugs: Losartan (Cozaar) 50 mg - Last filled 10-11-2020 90 DS at [place] [facility] Pharmacist Assistant [phone]

## 2020-12-31 ENCOUNTER — Other Ambulatory Visit: Payer: Self-pay | Admitting: Primary Care

## 2021-01-02 ENCOUNTER — Other Ambulatory Visit: Payer: Self-pay

## 2021-01-04 ENCOUNTER — Other Ambulatory Visit: Payer: Self-pay

## 2021-01-04 MED ORDER — METOPROLOL SUCCINATE ER 25 MG PO TB24: 12.5000 mg | ORAL_TABLET | Freq: Every day | ORAL | 3 refills | Status: DC

## 2021-01-08 ENCOUNTER — Encounter: Payer: Self-pay | Admitting: Pulmonary Disease

## 2021-01-08 ENCOUNTER — Other Ambulatory Visit: Payer: Self-pay

## 2021-01-08 ENCOUNTER — Ambulatory Visit (INDEPENDENT_AMBULATORY_CARE_PROVIDER_SITE_OTHER): Payer: Medicare Other | Admitting: Pulmonary Disease

## 2021-01-08 DIAGNOSIS — I2510 Atherosclerotic heart disease of native coronary artery without angina pectoris: Secondary | ICD-10-CM | POA: Diagnosis not present

## 2021-01-08 DIAGNOSIS — J9611 Chronic respiratory failure with hypoxia: Secondary | ICD-10-CM

## 2021-01-08 DIAGNOSIS — J449 Chronic obstructive pulmonary disease, unspecified: Secondary | ICD-10-CM | POA: Diagnosis not present

## 2021-01-08 NOTE — Progress Notes (Signed)
Subjective:    Patient ID: Kim [name], female    DOB: January 02, 1956, 65 y.o.   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     COPD exacerbations: June 2020- COPD exacerbation/Prednisone + zpack September- prednisone PCP December 2020- Prednisone + zpack ? PCP January 18th 2021- COPD exacerbation/Prednisone + doxycycline  Feb 5th 2021- COPD exacerbation/Prednisone + Zpack  Feb 9th 2021- Prolonged COPD exacerbation requiring high dose prednisone/ CXR ordered  April 4th- Hospitalized for COPD exacerbation treated with IV steriods and oral prednisone taper 08/12/2019 - Zpack and extrended prednisone taper     09/2019>> low-dose steroids - tapered to off by end August.    01/2020 OV >> pred 40 mg, settling at 10 03/2020 hospitalized for 10 days for COPD exacerbation 09/2020 developed COVID infection, received monoclonal antibody She was admitted from 11/15/2020-11/20/2020 for COPD with acute exacerbation and stress-induced cardiomyopathy/acute systolic heart failure/type II myocardial infarction secondary to cardiomyopathy   Reviewed discharge summary Last OV 12/2020 >> daliresp added Switched back to symbicort + spiriva instead of Breztri due to hoarseness EF was noted to have dropped to 35% during hospitalization but now has recovered to 55 to 60% on repeat echo.  Her breathing continues to be poor.  She developed skin reaction with peeling and she stopped most of her medications including Daliresp and Spiriva.  Continues on Symbicort. She feels hoarseness was due to Spiriva  She would like a new Inogen tank, she is now on oxygen 24/7     Significant tests/ events reviewed 01/2017 alpha-1 >> 102 nml level ,PI* M1N   11/2014: FVC 2.00 L (63%) FEV1 1.00 L (41%) ratio 50  negative bronchodilator response TLC 5.06 L (103%) RV 140% DLCO uncorrected 51%  CT  angiogram chest 09/2020 no airspace disease  CT angiogram chest 03/24/2020  Severe centrilobular emphysema with mild superimposed diffuse ground-glass opacity with an apical predominance, similar in comparison to prior study. Findings likely reflect smoking related lung disease/respiratory bronchiolitis. New mild compression fracture deformity of L1 since 10/ 2021 01/2020 LDCT - stable, T7 fracture 12/2018 LD CT chest - stable nodules   10/2017 CT chest-stable nodules, adrenal adenoma 10/2016 CT chest showed subcentimeter nodules which have been stable since 2017, right adrenal nodule was stable since 2015 and considered benign   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   OSTEOARTHRITIS 06/05/2009   PARESTHESIA 08/24/2007   TOBACCO ABUSE 01/25/2010     Review of Systems neg for any significant sore throat, dysphagia, itching, sneezing, nasal congestion or excess/ purulent secretions, fever, chills, sweats, unintended wt loss, pleuritic or exertional cp, hempoptysis, orthopnea pnd or change in chronic leg swelling. Also denies presyncope, palpitations, heartburn, abdominal pain, nausea, vomiting, diarrhea or change in bowel or urinary habits, dysuria,hematuria, rash, arthralgias, visual complaints, headache, numbness weakness or ataxia.     Objective:   Physical Exam   Gen. Pleasant, well-nourished, in no distress ENT - no thrush, no pallor/icterus,no post nasal drip Neck: No JVD, no thyromegaly, no carotid bruits Lungs: no use of accessory muscles, no dullness to percussion, decreased bilateral without rales or rhonchi  Cardiovascular: Rhythm regular, heart sounds  normal, no murmurs or gallops, no peripheral edema Musculoskeletal: No deformities, no cyanosis or  clubbing         Assessment & Plan:

## 2021-01-08 NOTE — Assessment & Plan Note (Signed)
Explained that Symbicort is probably the likely cause of hoarseness. Resume Spiriva. Okay to stop Daliresp. Trying to minimize oral steroids due to osteoporosis and vertebral fractures

## 2021-01-08 NOTE — Patient Instructions (Signed)
Ambulatory sat for new Inogen tank Start back on spiriva, stay on symbicort

## 2021-01-08 NOTE — Assessment & Plan Note (Signed)
Oxygen saturation dropped to 87% on ambulation and improved on 2 L pulse. Will send prescription to [facility] for new Inogen POC at her request

## 2021-01-09 ENCOUNTER — Ambulatory Visit (INDEPENDENT_AMBULATORY_CARE_PROVIDER_SITE_OTHER): Payer: Medicare Other | Admitting: Adult Health

## 2021-01-09 ENCOUNTER — Encounter: Payer: Self-pay | Admitting: Adult Health

## 2021-01-09 VITALS — BP 120/70 | HR 79 | Temp 97.9°F | Ht 63.0 in | Wt 132.0 lb

## 2021-01-09 DIAGNOSIS — M150 Primary generalized (osteo)arthritis: Secondary | ICD-10-CM

## 2021-01-09 DIAGNOSIS — E785 Hyperlipidemia, unspecified: Secondary | ICD-10-CM | POA: Diagnosis not present

## 2021-01-09 DIAGNOSIS — J441 Chronic obstructive pulmonary disease with (acute) exacerbation: Secondary | ICD-10-CM | POA: Diagnosis not present

## 2021-01-09 DIAGNOSIS — I2510 Atherosclerotic heart disease of native coronary artery without angina pectoris: Secondary | ICD-10-CM | POA: Diagnosis not present

## 2021-01-09 DIAGNOSIS — M159 Polyosteoarthritis, unspecified: Secondary | ICD-10-CM

## 2021-01-09 DIAGNOSIS — G8929 Other chronic pain: Secondary | ICD-10-CM

## 2021-01-09 LAB — LIPID PANEL
Cholesterol: 263 mg/dL — ABNORMAL HIGH (ref 0–200)
HDL: 45.7 mg/dL (ref >39.00)
NonHDL: 217.34
Total CHOL/HDL Ratio: 6
Triglycerides: 228 mg/dL — ABNORMAL HIGH (ref 0.0–149.0)
VLDL: 45.6 mg/dL — ABNORMAL HIGH (ref 0.0–40.0)

## 2021-01-09 LAB — LDL CHOLESTEROL, DIRECT: Direct LDL: 182 mg/dL

## 2021-01-09 MED ORDER — PREDNISONE 10 MG PO TABS: ORAL_TABLET | ORAL | 0 refills | Status: DC

## 2021-01-09 NOTE — Progress Notes (Signed)
Subjective:    Patient ID: Kim [name], female    DOB: Oct 04, 1955, 65 y.o.   MRN: [id number]  HPI 65 year old female who  has a past medical history of C8 RADICULOPATHY (06/05/2009), COPD (08/31/2007), DEPRESSION (11/19/2006), DYSLIPIDEMIA (03/12/2009), MI (mitral incompetence), NEPHROLITHIASIS (01/05/2008), OSTEOARTHRITIS (06/05/2009), PARESTHESIA (08/24/2007), and TOBACCO ABUSE (01/25/2010).  She presents to the office to for multiple issues including pain management visit, mild COPD exacerbation, and would like to have her cholesterol level checked  N[facility] reviewed in EPIC   Indication for chronic opioid: Osteoarthritis involving multiple joints Medication and dose: Norco 7.5-325 mg, every 6 hours PRN  # pills per month: 45 Last UDS date: 10/18/2020 Opioid Treatment Agreement signed: Yes Opioid Treatment Agreement last reviewed with patient: 01/09/2021 N[facility] reviewed this encounter (include red flags):  Reviewed with no red flags   She also reports that over the last 3 to 4 days she has been having issues with shortness of breath, wheezing, and feeling as though she is not able to get a deep breath despite using her home oxygen.  Denies fevers, chills, or feeling acutely ill.  She was also placed on Crestor given her recent hospital admission but has not been taking this due to side effects.  She would like to recheck her lipid panel today Lab Results  Component Value Date   CHOL 206 (H) 10/18/2020   HDL 50.60 10/18/2020   LDLCALC 117 (H) 10/18/2020   LDLDIRECT 155.4 12/29/2012   TRIG 195.0 (H) 10/18/2020   CHOLHDL 4 10/18/2020     Review of Systems  Constitutional: Negative.   Respiratory:  Positive for cough, shortness of breath and wheezing.   Cardiovascular: Negative.   Gastrointestinal: Negative.   Genitourinary: Negative.   Musculoskeletal:  Positive for arthralgias and back pain.  Neurological: Negative.   All other systems reviewed and are negative. Past Medical  History:  Diagnosis Date   C8 RADICULOPATHY 06/05/2009   COPD 08/31/2007   DEPRESSION 11/19/2006   DYSLIPIDEMIA 03/12/2009   MI (mitral incompetence)    NEPHROLITHIASIS 01/05/2008   OSTEOARTHRITIS 06/05/2009   PARESTHESIA 08/24/2007   TOBACCO ABUSE 01/25/2010    Social History   Socioeconomic History   Marital status: Married    Spouse name: [name]   Number of children: 2   Years of education: Not on file   Highest education level: Not on file  Occupational History   Occupation: Patient care aide-sits with elderly lady  Tobacco Use   Smoking status: Former    Packs/day: 2.00    Years: 38.00    Pack years: 76.00    Types: Cigarettes    Quit date: 03/07/2014    Years since quitting: 6.8   Smokeless tobacco: Never  Vaping Use   Vaping Use: Never used  Substance and Sexual Activity   Alcohol use: No    Alcohol/week: 0.0 standard drinks   Drug use: No   Sexual activity: Not on file  Other Topics Concern   Not on file  Social History Narrative   [facility] (01/12/17):   Originally from [facility]. Has lived in [place] for 17 years. Married. Has 2 dogs currently. No mold and only 1 indoor plant. Had her home professionally cleaned a month ago. No bird or hot tub exposure. 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    Social Determinants of Health   Financial Resource Strain: Low Risk    Difficulty of Paying Living Expenses: Not hard at  all  Food Insecurity: No Food Insecurity   Worried About Charity fundraiser in the Last Year: Never true   Ran Out of Food in the Last Year: Never true  Transportation Needs: No Transportation Needs   Lack of Transportation (Medical): No   Lack of Transportation (Non-Medical): No  Physical Activity: Inactive   Days of Exercise per Week: 0 days   Minutes of Exercise per Session: 0 min  Stress: No Stress Concern Present   Feeling of Stress : Not at all  Social Connections: Moderately Integrated   Frequency of  Communication with Friends and Family: More than three times a week   Frequency of Social Gatherings with Friends and Family: More than three times a week   Attends Religious Services: More than 4 times per year   Active Member of Clubs or Organizations: No   Attends Archivist Meetings: Never   Marital Status: Married  Human resources officer Violence: Not At Risk   Fear of Current or Ex-Partner: No   Emotionally Abused: No   Physically Abused: No   Sexually Abused: No    Past Surgical History:  Procedure Laterality Date   [place] N/A 07/05/2015   Procedure: Left Heart Cath and Coronary Angiography;  Surgeon: Leonie Man, MD;  Location: [facility];  Service: Cardiovascular;  Laterality: N/A;   CHOLECYSTECTOMY  1989   collapse lung  1990   COLONOSCOPY WITH PROPOFOL N/A 05/14/2018   Procedure: COLONOSCOPY WITH PROPOFOL;  Surgeon: Irene Shipper, MD;  Location: [facility];  Service: Endoscopy;  Laterality: N/A;   EXCISION MASS ABDOMINAL  2019   EXPLORATORY LAPAROTOMY  1969   LEFT HEART CATH AND CORONARY ANGIOGRAPHY N/A 11/19/2020   Procedure: LEFT HEART CATH AND CORONARY ANGIOGRAPHY;  Surgeon: [name], MD;  Location: [facility];  Service: Cardiovascular;  Laterality: N/A;   POLYPECTOMY  05/14/2018   Procedure: POLYPECTOMY;  Surgeon: Irene Shipper, MD;  Location: [facility];  Service: Endoscopy;;   SALPINGOOPHORECTOMY  2019    Family History  Problem Relation Age of Onset   Cancer Mother        lung   Heart attack Mother    Hypertension Sister    Multiple sclerosis Brother    Diabetes Sister    Rheum arthritis Sister    Thyroid disease Sister    Multiple sclerosis Other    Alcohol abuse Other    Arthritis Other    Diabetes Other    Kidney disease Other    Cancer Other        lung,ovarian,skin, uterine   Stroke Other    Heart disease Other    Melanoma Other    Osteoporosis  Other    Heart attack Maternal Uncle    Heart attack Other        [address]   Heart attack Other        NEPHEW   Hypertension Brother    Stroke Maternal Grandmother    Colon cancer Neg Hx     Allergies  Allergen Reactions   Tape Other (See Comments)    SKIN TEARS EASILY!!!!   Ciprofloxacin Other (See Comments)    Hallucinations    Lisinopril Cough   Penicillins Hives and Other (See Comments)    Tolerates Omnicef Has patient had a PCN reaction causing immediate rash, facial/tongue/throat swelling, SOB or lightheadedness with hypotension: Yes Has patient had a PCN  reaction causing severe rash involving mucus membranes or skin necrosis: No Has patient had a PCN reaction that required hospitalization No Has patient had a PCN reaction occurring within the last 10 years: No If all of the above answers are "NO", then may proceed with Cephalosporin use.     Current Outpatient Medications on File Prior to Visit  Medication Sig Dispense Refill   albuterol (VENTOLIN HFA) 108 (90 Base) MCG/ACT inhaler Take two puffs every 4-6 hours as needed for shortness of breath/wheezing . 90 day supply (Patient taking differently: Inhale 2 puffs into the lungs every 4 (four) hours as needed for shortness of breath or wheezing.) 162 g 1   ALPRAZolam (XANAX) 0.5 MG tablet TAKE 1/2 TO 1 TABLET BY MOUTH THREE TIMES DAILY (Patient taking differently: Take 0.25-0.5 mg by mouth every 6 (six) hours.) 60 tablet 2   aspirin EC 81 MG EC tablet Take 1 tablet (81 mg total) by mouth daily.     cetirizine (ZYRTEC) 10 MG tablet Take 10 mg by mouth daily.     furosemide (LASIX) 40 MG tablet Take 1 tablet (40 mg total) by mouth daily as needed (for an overnight weight gain of 3-5 pounds of fluid).     HYDROcodone-acetaminophen (NORCO) 7.5-325 MG tablet Take 1 tablet by mouth every 6 (six) hours as needed for moderate pain. 45 tablet 0   ipratropium-albuterol (DUONEB) 0.5-2.5 (3) MG/3ML SOLN USE 3 ML VIA NEBULIZER EVERY 6 HOURS  AS NEEDED.  DX: J44.9 360 mL 5   losartan (COZAAR) 50 MG tablet Take 50 mg by mouth daily.     magnesium oxide (MAG-OX) 400 MG tablet Take 400 mg by mouth daily.     metoprolol succinate (TOPROL-XL) 25 MG 24 hr tablet Take 0.5 tablets (12.5 mg total) by mouth daily. 45 tablet 3   MUCINEX MAXIMUM STRENGTH 1200 MG TB12 Take 1,200 mg by mouth every 12 (twelve) hours.     nitroGLYCERIN (NITROSTAT) 0.4 MG SL tablet Place 1 tablet (0.4 mg total) under the tongue every 5 (five) minutes as needed for chest pain. 25 tablet 3   nystatin (MYCOSTATIN) 100000 UNIT/ML suspension Take 5 mLs (500,000 Units total) by mouth 4 (four) times daily as needed (for thrush).     OXYGEN Inhale 2 L/min into the lungs continuous.     PARoxetine (PAXIL) 30 MG tablet TAKE 1 TABLET EVERY DAY (Patient taking differently: Take 30 mg by mouth in the morning.) 90 tablet 1   Spacer/Aero Chamber Mouthpiece MISC 1 Device by Does not apply route as directed. 1 each 0   SYMBICORT 160-4.5 MCG/ACT inhaler INHALE 2 PUFFS TWICE DAILY 30 g 3   Tiotropium Bromide Monohydrate (SPIRIVA RESPIMAT) 2.5 MCG/ACT AERS Inhale 2 puffs into the lungs daily. 4 g 5   Current Facility-Administered Medications on File Prior to Visit  Medication Dose Route Frequency Provider Last Rate Last Admin   0.9 %  sodium chloride infusion   Intravenous PRN Pickenpack-Cousar, Athena N, NP        BP 120/70   Pulse 79   Temp 97.9 F (36.6 C) (Oral)   Ht 5\' 3"  (1.6 m)   Wt 132 lb (59.9 kg)   SpO2 94%   BMI 23.38 kg/m       Objective:   Physical Exam Vitals and nursing note reviewed.  Constitutional:      Appearance: Normal appearance.  Cardiovascular:     Rate and Rhythm: Normal rate and regular rhythm.  Pulses: Normal pulses.     Heart sounds: Normal heart sounds.  Pulmonary:     Effort: Pulmonary effort is normal.     Breath sounds: Decreased air movement present. Examination of the right-upper field reveals wheezing. Examination of the  left-upper field reveals wheezing. Examination of the right-middle field reveals wheezing. Examination of the left-middle field reveals wheezing. Examination of the right-lower field reveals wheezing. Examination of the left-lower field reveals wheezing. Decreased breath sounds and wheezing present. No rhonchi or rales.  Musculoskeletal:        General: Normal range of motion.  Skin:    General: Skin is warm and dry.  Neurological:     General: No focal deficit present.     Mental Status: She is alert and oriented to person, place, and time.  Psychiatric:        Mood and Affect: Mood normal.        Behavior: Behavior normal.        Thought Content: Thought content normal.        Judgment: Judgment normal.      Assessment & Plan:  1. Encounter for chronic pain management  - DRUG MONITOR, PANEL 1, W/CONF, URINE; Future - DRUG MONITOR, PANEL 1, W/CONF, URINE  2. Primary osteoarthritis involving multiple joints  - DRUG MONITOR, PANEL 1, W/CONF, URINE; Future - DRUG MONITOR, PANEL 1, W/CONF, URINE  3. Hyperlipidemia, unspecified hyperlipidemia type - Consider trial of different statin  - Lipid panel; Future - Lipid panel  4. COPD exacerbation (HCC)  - predniSONE (DELTASONE) 10 MG tablet; 40 mg x 3 days, 20 mg x 3 days, 10 mg x 3 days  Dispense: 21 tablet; Refill: 0 - Follow up with myself or pulmonary if not improving in the next 2-3 days    Kim Peng, NP

## 2021-01-11 ENCOUNTER — Telehealth: Payer: Self-pay | Admitting: Adult Health

## 2021-01-11 NOTE — Telephone Encounter (Signed)
Dated patient on her labs.  Cholesterol panel has increased.  She is willing to restart her statin, I will have her take it every other day to see how she responds to it

## 2021-01-14 LAB — DRUG MONITOR, PANEL 1, W/CONF, URINE
Alphahydroxyalprazolam: 378 ng/mL — ABNORMAL HIGH (ref <25)
Alphahydroxymidazolam: NEGATIVE ng/mL (ref <50)
Alphahydroxytriazolam: NEGATIVE ng/mL (ref <50)
Aminoclonazepam: NEGATIVE ng/mL (ref <25)
Amphetamines: NEGATIVE ng/mL (ref <500)
Barbiturates: NEGATIVE ng/mL (ref <300)
Benzodiazepines: POSITIVE ng/mL — AB (ref <100)
Cocaine Metabolite: NEGATIVE ng/mL (ref <150)
Codeine: NEGATIVE ng/mL (ref <50)
Creatinine: 195.1 mg/dL (ref >=20.0)
Hydrocodone: 1137 ng/mL — ABNORMAL HIGH (ref <50)
Hydromorphone: 146 ng/mL — ABNORMAL HIGH (ref <50)
Hydroxyethylflurazepam: NEGATIVE ng/mL (ref <50)
Lorazepam: NEGATIVE ng/mL (ref <50)
Marijuana Metabolite: NEGATIVE ng/mL (ref <20)
Methadone Metabolite: NEGATIVE ng/mL (ref <100)
Morphine: NEGATIVE ng/mL (ref <50)
Nordiazepam: NEGATIVE ng/mL (ref <50)
Norhydrocodone: 2454 ng/mL — ABNORMAL HIGH (ref <50)
Opiates: POSITIVE ng/mL — AB (ref <100)
Oxazepam: NEGATIVE ng/mL (ref <50)
Oxidant: NEGATIVE ug/mL (ref <200)
Oxycodone: NEGATIVE ng/mL (ref <100)
Phencyclidine: NEGATIVE ng/mL (ref <25)
Temazepam: NEGATIVE ng/mL (ref <50)
pH: 5.6 (ref 4.5–9.0)

## 2021-01-14 LAB — DM TEMPLATE

## 2021-01-15 ENCOUNTER — Other Ambulatory Visit: Payer: Self-pay | Admitting: Adult Health

## 2021-01-15 DIAGNOSIS — M159 Polyosteoarthritis, unspecified: Secondary | ICD-10-CM

## 2021-01-15 MED ORDER — HYDROCODONE-ACETAMINOPHEN 7.5-325 MG PO TABS: 1.0000 | ORAL_TABLET | Freq: Four times a day (QID) | ORAL | 0 refills | Status: DC | PRN

## 2021-02-07 ENCOUNTER — Ambulatory Visit
Admission: RE | Admit: 2021-02-07 | Discharge: 2021-02-07 | Disposition: A | Discharge status: Home / Self Care | Payer: Medicare Other | Source: Ambulatory Visit | Attending: Adult Health | Admitting: Adult Health

## 2021-02-07 ENCOUNTER — Other Ambulatory Visit: Payer: Self-pay

## 2021-02-07 DIAGNOSIS — E2839 Other primary ovarian failure: Secondary | ICD-10-CM

## 2021-02-18 ENCOUNTER — Other Ambulatory Visit: Payer: Self-pay | Admitting: *Deleted

## 2021-02-18 DIAGNOSIS — Z87891 Personal history of nicotine dependence: Secondary | ICD-10-CM

## 2021-02-19 ENCOUNTER — Other Ambulatory Visit: Payer: Self-pay | Admitting: Adult Health

## 2021-02-19 DIAGNOSIS — J441 Chronic obstructive pulmonary disease with (acute) exacerbation: Secondary | ICD-10-CM

## 2021-02-19 NOTE — Progress Notes (Signed)
Chief Complaint  Patient presents with   Follow-up    CAD     History of Present Illness: 65 yo female with history of CAD, NICM, COPD, HLD, tobacco abuse here today for cardiac follow up. I saw her as a new patient in April 2016. She was hospitalized in [place] December 2015 with COPD exacerbation. Troponin was mildly elevated at 2. Cardiac catheterization in 2015 showed no obstructive CAD. She was felt to have a COPD exacerbation and she improved with treatment of her COPD. She quit smoking in September 2015. Admitted to [facility] March 2017 with respiratory distress, CHF. Cardiac cath March 2017 with minimal CAD, severe LV systolic dysfunction (KWIO=[id number]%). 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  She was diuresed with clinical improvement. She was seen several times in our office after discharge with dizziness and hypotension. Lisinopril and Lasix were decreased. She is followed in the pulmonary office by Dr. Elsworth Soho. Event monitor April 2017 with sinus, PACs, PVCs. Echo May 2018 with normal LV systolic function, mild AI. Most recent echo April 2021 with normal LV systolic function and mild AI. She was admitted to [facility] April 4-7, 2021 with a COPD exacerbation. She has required extra Lasix over the last year for fluid retention and adjusts her dose at home.   She is here today for follow up. The patient denies any chest pain, dyspnea, palpitations, lower extremity edema, orthopnea, PND, dizziness, near syncope or syncope.   Primary Care Physician: [name], NP  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   OSTEOARTHRITIS 06/05/2009   PARESTHESIA 08/24/2007   TOBACCO ABUSE 01/25/2010    Past Surgical History:  Procedure Laterality Date   ABDOMINAL  HYSTERECTOMY     APPENDECTOMY  1969   CARDIAC CATHETERIZATION N/A 07/05/2015   Procedure: Left Heart Cath and Coronary Angiography;  Surgeon: Leonie Man, MD;  Location: [facility];  Service: Cardiovascular;  Laterality: N/A;   CHOLECYSTECTOMY  1989   collapse lung  1990   COLONOSCOPY WITH PROPOFOL N/A 05/14/2018   Procedure: COLONOSCOPY WITH PROPOFOL;  Surgeon: Irene Shipper, MD;  Location: [facility];  Service: Endoscopy;  Laterality: N/A;   EXCISION MASS ABDOMINAL  2019   EXPLORATORY LAPAROTOMY  1969   LEFT HEART CATH AND CORONARY ANGIOGRAPHY N/A 11/19/2020   Procedure: LEFT HEART CATH AND CORONARY ANGIOGRAPHY;  Surgeon: [name], MD;  Location: [facility];  Service: Cardiovascular;  Laterality: N/A;   POLYPECTOMY  05/14/2018   Procedure: POLYPECTOMY;  Surgeon: Irene Shipper, MD;  Location: [facility];  Service: Endoscopy;;   SALPINGOOPHORECTOMY  2019    Current Outpatient Medications  Medication Sig Dispense Refill   albuterol (VENTOLIN HFA) 108 (90 Base) MCG/ACT inhaler Take two puffs every 4-6 hours as needed for shortness of breath/wheezing . 90 day supply 162 g 1   ALPRAZolam (XANAX) 0.5 MG tablet TAKE 1/2 TO 1 TABLET BY MOUTH THREE TIMES DAILY (Patient taking differently: Take 0.25-0.5 mg by mouth every 6 (six) hours.) 60 tablet 2   aspirin EC 81 MG EC tablet Take 1 tablet (81 mg total) by mouth daily.     cetirizine (ZYRTEC) 10 MG tablet Take 10 mg by mouth daily.     furosemide (LASIX) 40 MG tablet Take 1  tablet (40 mg total) by mouth daily as needed (for an overnight weight gain of 3-5 pounds of fluid).     HYDROcodone-acetaminophen (NORCO) 7.5-325 MG tablet Take 1 tablet by mouth every 6 (six) hours as needed for moderate pain. 45 tablet 0   losartan (COZAAR) 50 MG tablet Take 50 mg by mouth daily.     magnesium oxide (MAG-OX) 400 MG tablet Take 400 mg by mouth daily.     metoprolol succinate (TOPROL-XL) 25 MG 24 hr tablet Take 0.5 tablets (12.5 mg  total) by mouth daily. 45 tablet 3   MUCINEX MAXIMUM STRENGTH 1200 MG TB12 Take 1,200 mg by mouth every 12 (twelve) hours.     nitroGLYCERIN (NITROSTAT) 0.4 MG SL tablet Place 1 tablet (0.4 mg total) under the tongue every 5 (five) minutes as needed for chest pain. 25 tablet 3   nystatin (MYCOSTATIN) 100000 UNIT/ML suspension Take 5 mLs (500,000 Units total) by mouth 4 (four) times daily as needed (for thrush).     OXYGEN Inhale 2 L/min into the lungs continuous.     PARoxetine (PAXIL) 30 MG tablet TAKE 1 TABLET EVERY DAY (Patient taking differently: Take 30 mg by mouth in the morning.) 90 tablet 1   predniSONE (DELTASONE) 10 MG tablet 40 mg x 3 days, 20 mg x 3 days, 10 mg x 3 days 21 tablet 0   Spacer/Aero Chamber Mouthpiece MISC 1 Device by Does not apply route as directed. 1 each 0   SYMBICORT 160-4.5 MCG/ACT inhaler INHALE 2 PUFFS TWICE DAILY 30 g 3   Tiotropium Bromide Monohydrate (SPIRIVA RESPIMAT) 2.5 MCG/ACT AERS Inhale 2 puffs into the lungs daily. 4 g 5   ipratropium-albuterol (DUONEB) 0.5-2.5 (3) MG/3ML SOLN USE 3 ML VIA NEBULIZER EVERY 6 HOURS AS NEEDED.  DX: J44.9 (Patient not taking: Reported on 02/20/2021) 360 mL 5   Current Facility-Administered Medications  Medication Dose Route Frequency Provider Last Rate Last Admin   0.9 %  sodium chloride infusion   Intravenous PRN Pickenpack-Cousar, Carlena Sax, NP        Allergies  Allergen Reactions   Tape Other (See Comments)    SKIN TEARS EASILY!!!!   Ciprofloxacin Other (See Comments)    Hallucinations    Lisinopril Cough   Penicillins Hives and Other (See Comments)    Tolerates Omnicef Has patient had a PCN reaction causing immediate rash, facial/tongue/throat swelling, SOB or lightheadedness with hypotension: Yes Has patient had a PCN reaction causing severe rash involving mucus membranes or skin necrosis: No Has patient had a PCN reaction that required hospitalization No Has patient had a PCN reaction occurring within the last  10 years: No If all of the above answers are "NO", then may proceed with Cephalosporin use.     Social History   Socioeconomic History   Marital status: Married    Spouse name: [name]   Number of children: 2   Years of education: Not on file   Highest education level: Not on file  Occupational History   Occupation: Patient care aide-sits with elderly lady  Tobacco Use   Smoking status: Former    Packs/day: 2.00    Years: 38.00    Pack years: 76.00    Types: Cigarettes    Quit date: 03/07/2014    Years since quitting: 6.9   Smokeless tobacco: Never  Vaping Use   Vaping Use: Never used  Substance and Sexual Activity   Alcohol use: No    Alcohol/week: 0.0 standard drinks  Drug use: No   Sexual activity: Not on file  Other Topics Concern   Not on file  Social History Narrative   [facility] (01/12/17):   Originally from [facility] [facility] [place]. Has lived in [place] for 17 years. Married. Has 2 dogs currently. No mold and only 1 indoor plant. Had her home professionally cleaned a month ago. No bird or hot tub exposure. 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    Social Determinants of Health   Financial Resource Strain: Low Risk    Difficulty of Paying Living Expenses: Not hard at all  Food Insecurity: No Food Insecurity   Worried About Charity fundraiser in the Last Year: Never true   [place] in the Last Year: Never true  Transportation Needs: No Transportation Needs   Lack of Transportation (Medical): No   Lack of Transportation (Non-Medical): No  Physical Activity: Inactive   Days of Exercise per Week: 0 days   Minutes of Exercise per Session: 0 min  Stress: No Stress Concern Present   Feeling of Stress : Not at all  Social Connections: Moderately Integrated   Frequency of Communication with Friends and Family: More than three times a week   Frequency of Social Gatherings with Friends and Family: More than three times a week   Attends Religious  Services: More than 4 times per year   Active Member of [employer] or Organizations: No   Attends Music therapist: Never   Marital Status: Married  Human resources officer Violence: Not At Risk   Fear of Current or Ex-Partner: No   Emotionally Abused: No   Physically Abused: No   Sexually Abused: No    Family History  Problem Relation Age of Onset   Cancer Mother        lung   Heart attack Mother    Hypertension Sister    Multiple sclerosis Brother    Diabetes Sister    Rheum arthritis Sister    Thyroid disease Sister    Multiple sclerosis Other    Alcohol abuse Other    Arthritis Other    Diabetes Other    Kidney disease Other    Cancer Other        lung,ovarian,skin, uterine   Stroke Other    Heart disease Other    Melanoma Other    Osteoporosis Other    Heart attack Maternal Uncle    Heart attack Other        NEICE   Heart attack Other        NEPHEW   Hypertension Brother    Stroke Maternal Grandmother    Colon cancer Neg Hx     Review of Systems:  As stated in the HPI and otherwise negative.   BP 114/66   Pulse 67   Ht 5\' 3"  (1.6 m)   Wt 136 lb 3.2 oz (61.8 kg)   SpO2 98%   BMI 24.13 kg/m   Physical Examination: General: Well developed, well nourished, NAD  HEENT: OP clear, mucus membranes moist  SKIN: warm, dry. No rashes. Neuro: No focal deficits  Musculoskeletal: Muscle strength 5/5 all ext  Psychiatric: Mood and affect normal  Neck: No JVD, no carotid bruits, no thyromegaly, no lymphadenopathy.  Lungs:Clear bilaterally, no wheezes, rhonci, crackles Cardiovascular: Regular rate and rhythm. No murmurs, gallops or rubs. Abdomen:Soft. Bowel sounds present. Non-tender.  Extremities: No lower extremity edema. Pulses are 2 + in the bilateral DP/PT.  Echo  April 2021: 1. Left ventricular ejection fraction, by estimation, is 60 to 65%. The  left ventricle has normal function. The left ventricle has no regional  wall motion abnormalities. Left  ventricular diastolic parameters are  consistent with Grade I diastolic  dysfunction (impaired relaxation).   2. Right ventricular systolic function is normal. The right ventricular  size is normal. Tricuspid regurgitation signal is inadequate for assessing  PA pressure.   3. The mitral valve is grossly normal. No evidence of mitral valve  regurgitation. No evidence of mitral stenosis.   4. The aortic valve is tricuspid. Aortic valve regurgitation is mild.  Mild aortic valve sclerosis is present, with no evidence of aortic valve  stenosis.   5. The inferior vena cava is normal in size with greater than 50%  respiratory variability, suggesting right atrial pressure of 3 mmHg.   EKG:  EKG is not ordered today. The ekg ordered today demonstrates   Recent Labs: 05/22/2020: NT-Pro BNP 147 07/13/2020: TSH 1.38 11/16/2020: ALT 15 11/20/2020: B Natriuretic Peptide 211.4; Hemoglobin 13.6; Magnesium 2.1; Platelets 351 12/21/2020: BUN 19; Creatinine, Ser 0.91; Potassium 4.2; Sodium 145   Lipid Panel    Component Value Date/Time   CHOL 263 (H) 01/09/2021 0945   CHOL 221 (H) 01/25/2018 0811   TRIG 228.0 (H) 01/09/2021 0945   HDL 45.70 01/09/2021 0945   HDL 41 01/25/2018 0811   CHOLHDL 6 01/09/2021 0945   VLDL 45.6 (H) 01/09/2021 0945   LDLCALC 117 (H) 10/18/2020 0836   LDLCALC 151 (H) 01/25/2018 0811   LDLDIRECT 182.0 01/09/2021 0945     Wt Readings from Last 3 Encounters:  02/20/21 136 lb 3.2 oz (61.8 kg)  01/09/21 132 lb (59.9 kg)  01/08/21 133 lb 6.4 oz (60.5 kg)     Other studies Reviewed: Additional studies/ records that were reviewed today include: Records from [facility]. Review of the above records demonstrates:   Assessment and Plan:   1. CAD without angina: She has no chest pain. Mild CAD by cath in 2017. Will continue ASA and beta blocker  2. Hyperlipidemia: Lipids followed in primary care. She stopped her statin due to muscle aches.   3.  Stress induced  cardiomyopathy/Chronic diastolic CHF: LV function normal by echo April 2021. Her weight is stable. She is off of the beta blocker due to her COPD. She will continue Lasix 40 mg daily with extra 40 mg on days that her weight is up or there is swelling.   4. COPD: closely followed by pulmonary.  Current medicines are reviewed at length with the patient today.  The patient does not have concerns regarding medicines.  The following changes have been made:  no change  Labs/ tests ordered today include:   No orders of the defined types were placed in this encounter.   Disposition:   F/U with me in 1  months  Signed, Lauree Chandler, MD 02/20/2021 10:22 AM    [facility] [address], [place], [place]  [address] Phone: [phone]; Fax: [phone]

## 2021-02-20 ENCOUNTER — Ambulatory Visit (INDEPENDENT_AMBULATORY_CARE_PROVIDER_SITE_OTHER): Payer: Medicare Other | Admitting: Cardiovascular Disease

## 2021-02-20 ENCOUNTER — Other Ambulatory Visit: Payer: Self-pay

## 2021-02-20 ENCOUNTER — Encounter: Payer: Self-pay | Admitting: Cardiovascular Disease

## 2021-02-20 VITALS — BP 114/66 | HR 67 | Ht 63.0 in | Wt 136.2 lb

## 2021-02-20 DIAGNOSIS — I5181 Takotsubo syndrome: Secondary | ICD-10-CM | POA: Diagnosis not present

## 2021-02-20 DIAGNOSIS — I2510 Atherosclerotic heart disease of native coronary artery without angina pectoris: Secondary | ICD-10-CM

## 2021-02-20 DIAGNOSIS — I5032 Chronic diastolic (congestive) heart failure: Secondary | ICD-10-CM

## 2021-02-20 NOTE — Patient Instructions (Signed)
Medication Instructions:  No changes *If you need a refill on your cardiac medications before your next appointment, please call your pharmacy*   Lab Work: none  Testing/Procedures: none   Follow-Up: At [facility], you and your health needs are our priority.  As part of our continuing mission to provide you with exceptional heart care, we have created designated Provider Care Teams.  These Care Teams include your primary Cardiologist (physician) and Advanced Practice Providers (APPs -  Physician Assistants and Nurse Practitioners) who all work together to provide you with the care you need, when you need it.    Your next appointment:   12 month(s)  The format for your next appointment:   In Person  Provider:   Lauree Chandler, MD     Other Instructions

## 2021-03-02 ENCOUNTER — Other Ambulatory Visit: Payer: Self-pay | Admitting: Adult Health

## 2021-03-02 DIAGNOSIS — F32A Depression, unspecified: Secondary | ICD-10-CM

## 2021-03-04 ENCOUNTER — Telehealth: Payer: Self-pay | Admitting: Pulmonary Disease

## 2021-03-04 NOTE — Telephone Encounter (Signed)
Spoke with the pt  She is c/o increased SOB, wheezing and cough for the past 3 days  Denies fevers or aches OV with RA at 9 am tomorrow  Advised seek emergent care sooner if symptoms persist/worsen

## 2021-03-05 ENCOUNTER — Ambulatory Visit (INDEPENDENT_AMBULATORY_CARE_PROVIDER_SITE_OTHER): Payer: Medicare Other | Admitting: Pulmonary Disease

## 2021-03-05 ENCOUNTER — Other Ambulatory Visit: Payer: Self-pay

## 2021-03-05 ENCOUNTER — Encounter: Payer: Self-pay | Admitting: Pulmonary Disease

## 2021-03-05 DIAGNOSIS — J9611 Chronic respiratory failure with hypoxia: Secondary | ICD-10-CM | POA: Diagnosis not present

## 2021-03-05 DIAGNOSIS — J441 Chronic obstructive pulmonary disease with (acute) exacerbation: Secondary | ICD-10-CM | POA: Diagnosis not present

## 2021-03-05 DIAGNOSIS — I2510 Atherosclerotic heart disease of native coronary artery without angina pectoris: Secondary | ICD-10-CM | POA: Diagnosis not present

## 2021-03-05 MED ORDER — DOXYCYCLINE HYCLATE 100 MG PO TABS: 100.0000 mg | ORAL_TABLET | Freq: Two times a day (BID) | ORAL | 0 refills | Status: DC

## 2021-03-05 MED ORDER — PREDNISONE 10 MG PO TABS: ORAL_TABLET | ORAL | 0 refills | Status: DC

## 2021-03-05 MED ORDER — METHYLPREDNISOLONE ACETATE 80 MG/ML IJ SUSP
120.0000 mg | Freq: Once | INTRAMUSCULAR | Status: AC
  Administered 2021-03-05: 120 mg via INTRAMUSCULAR

## 2021-03-05 MED ORDER — METHYLPREDNISOLONE SODIUM SUCC 125 MG IJ SOLR: 120.0000 mg | Freq: Once | INTRAMUSCULAR | Status: DC

## 2021-03-05 NOTE — Patient Instructions (Addendum)
RX to [facility] for new Inogen tank 2 L Pulse SOlumedrol 120 mg IM x 1 now  Prednisone 10 mg tabs Take 4 tabs  daily with food x 4 days, then 3 tabs daily x 4 days, then 2 tabs daily x 4 days, then 1 tab daily x4 days then stop. #60  Doxycycline 100 mg bid  x7 days   Call if worse

## 2021-03-05 NOTE — Progress Notes (Signed)
Subjective:    Patient ID: Kim [name], female    DOB: Dec 26, 1955, 65 y.o.   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     COPD exacerbations: June 2020- COPD exacerbation/Prednisone + zpack September- prednisone PCP December 2020- Prednisone + zpack ? PCP January 18th 2021- COPD exacerbation/Prednisone + doxycycline  Feb 5th 2021- COPD exacerbation/Prednisone + Zpack  Feb 9th 2021- Prolonged COPD exacerbation requiring high dose prednisone/ CXR ordered  April 4th- Hospitalized for COPD exacerbation treated with IV steriods and oral prednisone taper 08/12/2019 - Zpack and extrended prednisone taper     09/2019>> low-dose steroids - tapered to off by end August.    01/2020 OV >> pred 40 mg, settling at 10 03/2020 hospitalized for 10 days for COPD exacerbation 09/2020 developed COVID infection, received monoclonal antibody She was admitted from 11/2020 for COPD with acute exacerbation and stress-induced cardiomyopathy/acute systolic heart failure/type II myocardial infarction secondary to cardiomyopathy     Last OV 12/2020 >> daliresp added Switched back to symbicort + spiriva instead of Breztri due to hoarseness EF was noted to have dropped to 35% during hospitalization but now has recovered to 55 to 60% on repeat echo.   She developed skin reaction with peeling and she stopped most of her medications including Daliresp    Last OV >> Rx sent for new Inogen tank, she is now on oxygen 24/7, resumed symbicort & spiriva   She is here for an acute visit today.  Neighbor was burning something in the fumes got to her, has been wheezing and coughing, minimal amount of white to beige sputum last 3 days, she started taking prednisone 40 mg for last 2 days and is still wheezing.  Has to increase oxygen to 3 L  She is still not received portable oxygen from [facility]  and requests of new prescription be sent to them         Significant tests/ events reviewed 01/2017 alpha-1 >> 102 nml level ,PI* M1N   11/2014: FVC 2.00 L (63%) FEV1 1.00 L (41%) ratio 50  negative bronchodilator response TLC 5.06 L (103%) RV 140% DLCO uncorrected 51%   CT angiogram chest 09/2020 no airspace disease   CT angiogram chest 03/24/2020  Severe centrilobular emphysema with mild superimposed diffuse ground-glass opacity with an apical predominance, similar in comparison to prior study. Findings likely reflect smoking related lung disease/respiratory bronchiolitis. New mild compression fracture deformity of L1 since 10/ 2021 01/2020 LDCT - stable, T7 fracture 12/2018 LD CT chest - stable nodules   10/2017 CT chest-stable nodules, adrenal adenoma 10/2016 CT chest showed subcentimeter nodules which have been stable since 2017, right adrenal nodule was stable since 2015 and considered benign    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   OSTEOARTHRITIS 06/05/2009   PARESTHESIA 08/24/2007   TOBACCO ABUSE 01/25/2010     Review of Systems  neg for any significant sore throat, dysphagia, itching, sneezing, nasal congestion or excess/ purulent secretions, fever, chills, sweats, unintended wt loss, pleuritic or exertional cp, hempoptysis, orthopnea pnd or change in chronic leg swelling. Also denies presyncope, palpitations, heartburn, abdominal pain, nausea, vomiting, diarrhea or change in bowel or urinary habits, dysuria,hematuria, rash, arthralgias, visual complaints, headache, numbness weakness  or ataxia.     Objective:   Physical Exam  Gen. Pleasant, well-nourished, in no distress ENT - no thrush, no pallor/icterus,no post nasal drip Neck: No JVD, no thyromegaly, no carotid bruits Lungs: no use of accessory muscles, no dullness to  percussion, bilateral diffuse rhonchi  Cardiovascular: Rhythm regular, heart sounds  normal, no murmurs or gallops, no peripheral edema Musculoskeletal: No deformities, no cyanosis or clubbing        Assessment & Plan:

## 2021-03-05 NOTE — Addendum Note (Signed)
Addended by: Darliss Ridgel on: 03/05/2021 12:21 PM   Modules accepted: Orders

## 2021-03-05 NOTE — Assessment & Plan Note (Signed)
Okay to increase oxygen to 3 L pulse during exacerbation. We will send in a prescription for Inogen tank to [facility]

## 2021-03-05 NOTE — Assessment & Plan Note (Signed)
Her current exacerbation seems to be related to exposure to fumes, not clearly infective but her sputum is starting to change color so we will give her antibiotic in addition to steroids.  I am concerned that she still has persistent bronchospasm in spite of taking 40 mg of prednisone last 2 days  SOlumedrol 120 mg IM x 1 now  Prednisone 10 mg tabs Take 4 tabs  daily with food x 4 days, then 3 tabs daily x 4 days, then 2 tabs daily x 4 days, then 1 tab daily x4 days then stop. #60  Doxycycline 100 mg bid  x7 days

## 2021-03-06 ENCOUNTER — Telehealth: Payer: Self-pay | Admitting: Pulmonary Disease

## 2021-03-06 NOTE — Telephone Encounter (Signed)
RA Please advise if you want the Inogen POC for the pt and if you want to set the liter flow to 3 liters for now?  thanks

## 2021-03-08 NOTE — Telephone Encounter (Signed)
[name], MD to Elie Confer, LPN     2:09 AM That is correct  [facility] for Danielle with [facility]

## 2021-03-25 ENCOUNTER — Telehealth: Payer: Self-pay | Admitting: Adult Health

## 2021-03-25 ENCOUNTER — Ambulatory Visit (INDEPENDENT_AMBULATORY_CARE_PROVIDER_SITE_OTHER): Payer: Medicare Other

## 2021-03-25 VITALS — BP 110/52 | HR 76 | Temp 98.2°F | Ht 60.0 in | Wt 134.2 lb

## 2021-03-25 DIAGNOSIS — Z0000 Encounter for general adult medical examination without abnormal findings: Secondary | ICD-10-CM | POA: Diagnosis not present

## 2021-03-25 NOTE — Patient Instructions (Addendum)
Kim [name] , Thank you for taking time to come for your Medicare Wellness Visit. I appreciate your ongoing commitment to your health goals. Please review the following plan we discussed and let me know if I can assist you in the future.   These are the goals we discussed:  Goals      Exercise 3x per week (30 min per time)        This is a list of the screening recommended for you and due dates:  Health Maintenance  Topic Date Due   COVID-19 Vaccine (3 - Booster for Pfizer series) 04/10/2021*   Zoster (Shingles) Vaccine (1 of 2) 06/23/2021*   Flu Shot  07/05/2021*   Mammogram  03/25/2022*   Tetanus Vaccine  03/25/2022*   Hepatitis C Screening: USPSTF Recommendation to screen - Ages 18-79 yo.  03/25/2022*   Colon Cancer Screening  05/15/2023   Pneumonia Vaccine  Completed   DEXA scan (bone density measurement)  Completed   HPV Vaccine  Aged Out  *Topic was postponed. The date shown is not the original due date.     Advanced directives: No  Conditions/risks identified: NoneOpioid Pain Medicine Management Opioids are powerful medicines that are used to treat moderate to severe pain. When used for short periods of time, they can help you to: Sleep better. Do better in physical or occupational therapy. Feel better in the first few days after an injury. Recover from surgery. Opioids should be taken with the supervision of a trained health care provider. They should be taken for the shortest period of time possible. This is because opioids can be addictive, and the longer you take opioids, the greater your risk of addiction. This addiction can also be called opioid use disorder. What are the risks? Using opioid pain medicines for longer than 3 days increases your risk of side effects. Side effects include: Constipation. Nausea and vomiting. Breathing difficulties (respiratory depression). Drowsiness. Confusion. Opioid use disorder. Itching. Taking opioid pain medicine for a  long period of time can affect your ability to do daily tasks. It also puts you at risk for: Motor vehicle crashes. Depression. Suicide. Heart attack. Overdose, which can be life-threatening. What is a pain treatment plan? A pain treatment plan is an agreement between you and your health care provider. Pain is unique to each person, and treatments vary depending on your condition. To manage your pain, you and your health care provider need to work together. To help you do this: Discuss the goals of your treatment, including how much pain you might expect to have and how you will manage the pain. Review the risks and benefits of taking opioid medicines. Remember that a good treatment plan uses more than one approach and minimizes the chance of side effects. Be honest about the amount of medicines you take and about any drug or alcohol use. Get pain medicine prescriptions from only one health care provider. Pain can be managed with many types of alternative treatments. Ask your health care provider to refer you to one or more specialists who can help you manage pain through: Physical or occupational therapy. Counseling (cognitive behavioral therapy). Good nutrition. Biofeedback. Massage. Meditation. Non-opioid medicine. Following a gentle exercise program. How to use opioid pain medicine Taking medicine Take your pain medicine exactly as told by your health care provider. Take it only when you need it. If your pain gets less severe, you may take less than your prescribed dose if your health care provider approves. If you  are not having pain, do nottake pain medicine unless your health care provider tells you to take it. If your pain is severe, do nottry to treat it yourself by taking more pills than instructed on your prescription. Contact your health care provider for help. Write down the times when you take your pain medicine. It is easy to become confused while on pain medicine. Writing  the time can help you avoid overdose. Take other over-the-counter or prescription medicines only as told by your health care provider. Keeping yourself and others safe  While you are taking opioid pain medicine: Do not drive, use machinery, or power tools. Do not sign legal documents. Do not drink alcohol. Do not take sleeping pills. Do not supervise children by yourself. Do not do activities that require climbing or being in high places. Do not go to a lake, river, ocean, spa, or swimming pool. Do not share your pain medicine with anyone. Keep pain medicine in a locked cabinet or in a secure area where pets and children cannot reach it. Stopping your use of opioids If you have been taking opioid medicine for more than a few weeks, you may need to slowly decrease (taper) how much you take until you stop completely. Tapering your use of opioids can decrease your risk of symptoms of withdrawal, such as: Pain and cramping in the abdomen. Nausea. Sweating. Sleepiness. Restlessness. Uncontrollable shaking (tremors). Cravings for the medicine. Do not attempt to taper your use of opioids on your own. Talk with your health care provider about how to do this. Your health care provider may prescribe a step-down schedule based on how much medicine you are taking and how long you have been taking it. Getting rid of leftover pills Do not save any leftover pills. Get rid of leftover pills safely by: Taking the medicine to a prescription take-back program. This is usually offered by the county or law enforcement. Bringing them to a pharmacy that has a drug disposal container. Flushing them down the toilet. Check the label or package insert of your medicine to see whether this is safe to do. Throwing them out in the trash. Check the label or package insert of your medicine to see whether this is safe to do. If it is safe to throw it out, remove the medicine from the original container, put it into a  sealable bag or container, and mix it with used coffee grounds, food scraps, dirt, or cat litter before putting it in the trash. Follow these instructions at home: Activity Do exercises as told by your health care provider. Avoid activities that make your pain worse. Return to your normal activities as told by your health care provider. Ask your health care provider what activities are safe for you. General instructions You may need to take these actions to prevent or treat constipation: Drink enough fluid to keep your urine pale yellow. Take over-the-counter or prescription medicines. Eat foods that are high in fiber, such as beans, whole grains, and fresh fruits and vegetables. Limit foods that are high in fat and processed sugars, such as fried or sweet foods. Keep all follow-up visits. This is important. Where to find support If you have been taking opioids for a long time, you may benefit from receiving support for quitting from a local support group or counselor. Ask your health care provider for a referral to these resources in your area. Where to find more information Centers for Disease Control and Prevention (CDC): http://www.wolf.info/ [place] Food and  Drug Administration (FDA): [place] Get help right away if: You may have taken too much of an opioid (overdosed). Common symptoms of an overdose: Your breathing is slower or more shallow than normal. You have a very slow heartbeat (pulse). You have slurred speech. You have nausea and vomiting. Your pupils become very small. You have other potential symptoms: You are very confused. You faint or feel like you will faint. You have cold, clammy skin. You have blue lips or fingernails. You have thoughts of harming yourself or harming others. These symptoms may represent a serious problem that is an emergency. Do not wait to see if the symptoms will go away. Get medical help right away. Call your local emergency services (911 in the [place] Do  not drive yourself to the hospital.  If you ever feel like you may hurt yourself or others, or have thoughts about taking your own life, get help right away. Go to your nearest emergency department or: Call your local emergency services (911 in the [place] Call the [facility] (819)359-9906 in the [place] Call a suicide crisis helpline, such as the [facility] at 281-061-9521 or 988 in the [place]. This is open 24 hours a day in the [place] Text the Crisis Text Line at 919-446-9127 (in the [place].). Summary Opioid medicines can help you manage moderate to severe pain for a short period of time. A pain treatment plan is an agreement between you and your health care provider. Discuss the goals of your treatment, including how much pain you might expect to have and how you will manage the pain. If you think that you or someone else may have taken too much of an opioid, get medical help right away. This information is not intended to replace advice given to you by your health care provider. Make sure you discuss any questions you have with your health care provider. Document Revised: 10/17/2020 Document Reviewed: 07/04/2020 Elsevier Patient Education  2022 [employer].   Next appointment: Follow up in one year for your annual wellness visit    Preventive Care 65 Years and Older, Female Preventive care refers to lifestyle choices and visits with your health care provider that can promote health and wellness. What does preventive care include? A yearly physical exam. This is also called an annual well check. Dental exams once or twice a year. Routine eye exams. Ask your health care provider how often you should have your eyes checked. Personal lifestyle choices, including: Daily care of your teeth and gums. Regular physical activity. Eating a healthy diet. Avoiding tobacco and drug use. Limiting alcohol use. Practicing safe sex. Taking low-dose aspirin every  day. Taking vitamin and mineral supplements as recommended by your health care provider. What happens during an annual well check? The services and screenings done by your health care provider during your annual well check will depend on your age, overall health, lifestyle risk factors, and family history of disease. Counseling  Your health care provider may ask you questions about your: Alcohol use. Tobacco use. Drug use. Emotional well-being. Home and relationship well-being. Sexual activity. Eating habits. History of falls. Memory and ability to understand (cognition). Work and work Statistician. Reproductive health. Screening  You may have the following tests or measurements: Height, weight, and BMI. Blood pressure. Lipid and cholesterol levels. These may be checked every 5 years, or more frequently if you are over 30 years old. Skin check. Lung cancer screening. You may have this screening every year starting  at age 68 if you have a 30-pack-year history of smoking and currently smoke or have quit within the past 15 years. Fecal occult blood test (FOBT) of the stool. You may have this test every year starting at age 29. Flexible sigmoidoscopy or colonoscopy. You may have a sigmoidoscopy every 5 years or a colonoscopy every 10 years starting at age 9. Hepatitis C blood test. Hepatitis B blood test. Sexually transmitted disease (STD) testing. Diabetes screening. This is done by checking your blood sugar (glucose) after you have not eaten for a while (fasting). You may have this done every 1-3 years. Bone density scan. This is done to screen for osteoporosis. You may have this done starting at age 30. Mammogram. This may be done every 1-2 years. Talk to your health care provider about how often you should have regular mammograms. Talk with your health care provider about your test results, treatment options, and if necessary, the need for more tests. Vaccines  Your health care  provider may recommend certain vaccines, such as: Influenza vaccine. This is recommended every year. Tetanus, diphtheria, and acellular pertussis (Tdap, Td) vaccine. You may need a Td booster every 10 years. Zoster vaccine. You may need this after age 72. Pneumococcal 13-valent conjugate (PCV13) vaccine. One dose is recommended after age 62. Pneumococcal polysaccharide (PPSV23) vaccine. One dose is recommended after age 7. Talk to your health care provider about which screenings and vaccines you need and how often you need them. This information is not intended to replace advice given to you by your health care provider. Make sure you discuss any questions you have with your health care provider. Document Released: 04/20/2015 Document Revised: 12/12/2015 Document Reviewed: 01/23/2015 Elsevier Interactive Patient Education  2017 [place] Prevention in the Home Falls can cause injuries. They can happen to people of all ages. There are many things you can do to make your home safe and to help prevent falls. What can I do on the outside of my home? Regularly fix the edges of walkways and driveways and fix any cracks. Remove anything that might make you trip as you walk through a door, such as a raised step or threshold. Trim any bushes or trees on the path to your home. Use bright outdoor lighting. Clear any walking paths of anything that might make someone trip, such as rocks or tools. Regularly check to see if handrails are loose or broken. Make sure that both sides of any steps have handrails. Any raised decks and porches should have guardrails on the edges. Have any leaves, snow, or ice cleared regularly. Use sand or salt on walking paths during winter. Clean up any spills in your garage right away. This includes oil or grease spills. What can I do in the bathroom? Use night lights. Install grab bars by the toilet and in the tub and shower. Do not use towel bars as grab  bars. Use non-skid mats or decals in the tub or shower. If you need to sit down in the shower, use a plastic, non-slip stool. Keep the floor dry. Clean up any water that spills on the floor as soon as it happens. Remove soap buildup in the tub or shower regularly. Attach bath mats securely with double-sided non-slip rug tape. Do not have throw rugs and other things on the floor that can make you trip. What can I do in the bedroom? Use night lights. Make sure that you have a light by your bed that is easy  to reach. Do not use any sheets or blankets that are too big for your bed. They should not hang down onto the floor. Have a firm chair that has side arms. You can use this for support while you get dressed. Do not have throw rugs and other things on the floor that can make you trip. What can I do in the kitchen? Clean up any spills right away. Avoid walking on wet floors. Keep items that you use a lot in easy-to-reach places. If you need to reach something above you, use a strong step stool that has a grab bar. Keep electrical cords out of the way. Do not use floor polish or wax that makes floors slippery. If you must use wax, use non-skid floor wax. Do not have throw rugs and other things on the floor that can make you trip. What can I do with my stairs? Do not leave any items on the stairs. Make sure that there are handrails on both sides of the stairs and use them. Fix handrails that are broken or loose. Make sure that handrails are as long as the stairways. Check any carpeting to make sure that it is firmly attached to the stairs. Fix any carpet that is loose or worn. Avoid having throw rugs at the top or bottom of the stairs. If you do have throw rugs, attach them to the floor with carpet tape. Make sure that you have a light switch at the top of the stairs and the bottom of the stairs. If you do not have them, ask someone to add them for you. What else can I do to help prevent  falls? Wear shoes that: Do not have high heels. Have rubber bottoms. Are comfortable and fit you well. Are closed at the toe. Do not wear sandals. If you use a stepladder: Make sure that it is fully opened. Do not climb a closed stepladder. Make sure that both sides of the stepladder are locked into place. Ask someone to hold it for you, if possible. Clearly mark and make sure that you can see: Any grab bars or handrails. First and last steps. Where the edge of each step is. Use tools that help you move around (mobility aids) if they are needed. These include: Canes. Walkers. Scooters. Crutches. Turn on the lights when you go into a dark area. Replace any light bulbs as soon as they burn out. Set up your furniture so you have a clear path. Avoid moving your furniture around. If any of your floors are uneven, fix them. If there are any pets around you, be aware of where they are. Review your medicines with your doctor. Some medicines can make you feel dizzy. This can increase your chance of falling. Ask your doctor what other things that you can do to help prevent falls. This information is not intended to replace advice given to you by your health care provider. Make sure you discuss any questions you have with your health care provider. Document Released: 01/18/2009 Document Revised: 08/30/2015 Document Reviewed: 04/28/2014 Elsevier Interactive Patient Education  2017 [employer].

## 2021-03-25 NOTE — Progress Notes (Addendum)
Subjective:   Kim [name] is a 65 y.o. female who presents for Medicare Annual (Subsequent) preventive examination.  Review of Systems    No ROS Cardiac Risk Factors include: advanced age (>89men, >8 women)    Objective:    Today's Vitals   03/25/21 1452  BP: (!) 110/52  Pulse: 76  Temp: 98.2 F (36.8 C)  SpO2: 96%  Weight: 134 lb 3.2 oz (60.9 kg)  Height: 5' (1.524 m)   Body mass index is 26.21 kg/m.  Advanced Directives 03/25/2021 11/15/2020 09/19/2020 03/24/2020 03/22/2020 07/11/2019 07/10/2019  Does Patient Have a Medical Advance Directive? No No No No No No No  Would patient like information on creating a medical advance directive? No - Patient declined No - Patient declined - No - Patient declined Yes (MAU/Ambulatory/Procedural Areas - Information given) No - Patient declined No - Patient declined    Current Medications (verified) Outpatient Encounter Medications as of 03/25/2021  Medication Sig   albuterol (VENTOLIN HFA) 108 (90 Base) MCG/ACT inhaler Take two puffs every 4-6 hours as needed for shortness of breath/wheezing . 90 day supply   ALPRAZolam (XANAX) 0.5 MG tablet TAKE 1/2 TO 1 TABLET BY MOUTH THREE TIMES DAILY   aspirin EC 81 MG EC tablet Take 1 tablet (81 mg total) by mouth daily.   cetirizine (ZYRTEC) 10 MG tablet Take 10 mg by mouth daily.   doxycycline (VIBRA-TABS) 100 MG tablet Take 1 tablet (100 mg total) by mouth 2 (two) times daily.   furosemide (LASIX) 40 MG tablet Take 1 tablet (40 mg total) by mouth daily as needed (for an overnight weight gain of 3-5 pounds of fluid).   HYDROcodone-acetaminophen (NORCO) 7.5-325 MG tablet Take 1 tablet by mouth every 6 (six) hours as needed for moderate pain.   ipratropium-albuterol (DUONEB) 0.5-2.5 (3) MG/3ML SOLN USE 3 ML VIA NEBULIZER EVERY 6 HOURS AS NEEDED.  DX: J44.9   losartan (COZAAR) 50 MG tablet Take 50 mg by mouth daily.   magnesium oxide (MAG-OX) 400 MG tablet Take 400 mg by mouth daily.   metoprolol  succinate (TOPROL-XL) 25 MG 24 hr tablet Take 0.5 tablets (12.5 mg total) by mouth daily.   MUCINEX MAXIMUM STRENGTH 1200 MG TB12 Take 1,200 mg by mouth every 12 (twelve) hours.   nitroGLYCERIN (NITROSTAT) 0.4 MG SL tablet Place 1 tablet (0.4 mg total) under the tongue every 5 (five) minutes as needed for chest pain.   nystatin (MYCOSTATIN) 100000 UNIT/ML suspension Take 5 mLs (500,000 Units total) by mouth 4 (four) times daily as needed (for thrush).   OXYGEN Inhale 2 L/min into the lungs continuous.   PARoxetine (PAXIL) 30 MG tablet TAKE 1 TABLET EVERY DAY (Patient taking differently: Take 30 mg by mouth in the morning.)   predniSONE (DELTASONE) 10 MG tablet 40 mg x 3 days, 20 mg x 3 days, 10 mg x 3 days   predniSONE (DELTASONE) 10 MG tablet Take 4 tabs daily x 4 days, then 3 tabs daily x 4 days, then 2 tabs daily x 4 days, then 1 tab daily x4 days then stop. #60   Spacer/Aero Chamber Mouthpiece MISC 1 Device by Does not apply route as directed.   SYMBICORT 160-4.5 MCG/ACT inhaler INHALE 2 PUFFS TWICE DAILY   Tiotropium Bromide Monohydrate (SPIRIVA RESPIMAT) 2.5 MCG/ACT AERS Inhale 2 puffs into the lungs daily.   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   OSTEOARTHRITIS 06/05/2009   PARESTHESIA 08/24/2007   TOBACCO ABUSE 01/25/2010   Past Surgical History:  Procedure Laterality Date   ABDOMINAL HYSTERECTOMY     APPENDECTOMY  1969   CARDIAC CATHETERIZATION N/A 07/05/2015   Procedure: Left Heart Cath and Coronary Angiography;  Surgeon: Leonie Man, MD;  Location: [facility];  Service: Cardiovascular;  Laterality: N/A;   CHOLECYSTECTOMY   1989   collapse lung  1990   COLONOSCOPY WITH PROPOFOL N/A 05/14/2018   Procedure: COLONOSCOPY WITH PROPOFOL;  Surgeon: Irene Shipper, MD;  Location: [facility];  Service: Endoscopy;  Laterality: N/A;   EXCISION MASS ABDOMINAL  2019   EXPLORATORY LAPAROTOMY  1969   LEFT HEART CATH AND CORONARY ANGIOGRAPHY N/A 11/19/2020   Procedure: LEFT HEART CATH AND CORONARY ANGIOGRAPHY;  Surgeon: [name], MD;  Location: [facility];  Service: Cardiovascular;  Laterality: N/A;   POLYPECTOMY  05/14/2018   Procedure: POLYPECTOMY;  Surgeon: Irene Shipper, MD;  Location: [facility];  Service: Endoscopy;;   SALPINGOOPHORECTOMY  2019   Family History  Problem Relation Age of Onset   Cancer Mother        lung   Heart attack Mother    Hypertension Sister    Multiple sclerosis Brother    Diabetes Sister    Rheum arthritis Sister    Thyroid disease Sister    Multiple sclerosis Other    Alcohol abuse Other    Arthritis Other    Diabetes Other    Kidney disease Other    Cancer Other        lung,ovarian,skin, uterine   Stroke Other    Heart disease Other    Melanoma Other    Osteoporosis Other    Heart attack Maternal Uncle    Heart attack Other        NEICE   Heart attack Other        NEPHEW   Hypertension Brother    Stroke Maternal Grandmother    Colon cancer Neg Hx    Social History   Socioeconomic History   Marital status: Married    Spouse name: [name]   Number of children: 2   Years of education: Not on file   Highest education level: Not on file  Occupational History   Occupation: Patient care aide-sits with elderly lady  Tobacco Use   Smoking status: Former    Packs/day: 2.00    Years: 38.00    Pack years: 76.00    Types: Cigarettes    Quit date: 03/07/2014    Years since quitting: 7.0   Smokeless tobacco: Never  Vaping Use   Vaping Use: Never used  Substance and Sexual Activity   Alcohol use: No    Alcohol/week: 0.0 standard drinks   Drug use: No    Sexual activity: Not on file  Other Topics Concern   Not on file  Social History Narrative   [facility] (01/12/17):   Originally from [facility]. Has lived in [place] for 17 years. Married. Has 2 dogs currently. No mold and only 1 indoor plant. Had her home professionally cleaned a month ago. No bird or hot tub exposure. 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  Social Determinants of Health   Financial Resource Strain: Low Risk    Difficulty of Paying Living Expenses: Not hard at all  Food Insecurity: No Food Insecurity   Worried About Charity fundraiser in the Last Year: Never true   [place] in the Last Year: Never true  Transportation Needs: No Transportation Needs   Lack of Transportation (Medical): No   Lack of Transportation (Non-Medical): No  Physical Activity: Inactive   Days of Exercise per Week: 0 days   Minutes of Exercise per Session: 0 min  Stress: No Stress Concern Present   Feeling of Stress : Not at all  Social Connections: Moderately Isolated   Frequency of Communication with Friends and Family: More than three times a week   Frequency of Social Gatherings with Friends and Family: More than three times a week   Attends Religious Services: Never   Marine scientist or Organizations: No   Attends Archivist Meetings: Never   Marital Status: Married    Clinical Intake:   Diabetic? No  Interpreter Needed?: No Activities of Daily Living In your present state of health, do you have any difficulty performing the following activities: 03/25/2021 11/17/2020  Hearing? N -  Vision? N -  Comment Wears contacts -  Difficulty concentrating or making decisions? N -  Walking or climbing stairs? N -  Dressing or bathing? N -  Doing errands, shopping? N Y  Conservation officer, nature and eating ? N -  Comment Husband assist -  Using the Toilet? N -  In the past six months, have you accidently leaked urine? N -  Do you have problems  with loss of bowel control? N -  Managing your Medications? N -  Managing your Finances? N -  Housekeeping or managing your Housekeeping? N -  Comment Husband Assist -  Some recent data might be hidden    Patient Care Team: [name], NP as PCP - General (Family Medicine) [name], MD as PCP - Cardiology (Cardiology) Ortho, [facility] (Orthopedic Surgery) Viona Gilmore, [facility] as Pharmacist (Pharmacist)  Indicate any recent Medical Services you may have received from other than [facility] providers in the past year (date may be approximate).     Assessment:   This is a routine wellness examination for Taeko.  Hearing/Vision screen Hearing Screening - Comments:: No difficulty hearing Vision Screening - Comments:: Wears contacts. Followed by [facility]  Dietary issues and exercise activities discussed: Current Exercise Habits: The patient does not participate in regular exercise at present   Goals Addressed   None    Depression Screen PHQ 2/9 Scores 03/25/2021 03/22/2020 03/19/2018 03/26/2017 02/09/2017  PHQ - 2 Score 2 0 0 2 3  PHQ- 9 Score 6 0 - 8 9    Fall Risk Fall Risk  03/25/2021 03/22/2020 03/19/2018 02/09/2017  Falls in the past year? 0 0 0 No  Number falls in past yr: 0 0 - -  Injury with Fall? 0 0 - -  Risk for fall due to : - No Fall Risks - -  Follow up - Falls evaluation completed;Falls prevention discussed - -    FALL RISK PREVENTION PERTAINING TO THE HOME:  Any stairs in or around the home? Yes  If so, are there any without handrails? No  Home free of loose throw rugs in walkways, pet beds, electrical cords, etc? Yes  Adequate lighting in your home to reduce risk of falls? Yes  ASSISTIVE DEVICES UTILIZED TO PREVENT FALLS:  Life alert? No  Use of a cane, walker or w/c? Yes  Grab bars in the bathroom? Yes  Shower chair or bench in shower? No  Elevated toilet seat or a handicapped toilet? Yes   TIMED UP AND GO:  Was the test  performed? Yes .  Length of time to ambulate 10 feet: 5 sec.   Gait steady and fast without use of assistive device  Cognitive Function:     6CIT Screen 03/25/2021  What Year? 0 points  What month? 0 points  What time? 0 points  Count back from 20 0 points  Months in reverse 0 points  Repeat phrase 2 points  Total Score 2    Immunizations Immunization History  Administered Date(s) Administered   Influenza Split 01/31/2011, 01/27/2012, 03/25/2014   Influenza Whole 01/05/2008, 03/12/2009, 03/13/2010   Influenza,inj,Quad PF,6+ Mos 12/29/2012, 12/20/2014, 01/07/2017, 12/17/2017, 12/17/2018, 01/09/2020   Influenza-Unspecified 01/31/2011, 01/27/2012, 12/29/2012, 03/25/2014, 12/20/2014, 02/11/2016, 01/07/2017, 12/17/2017, 12/17/2018   PFIZER(Purple Top)SARS-COV-2 Vaccination 06/29/2019, 07/25/2019   Pneumococcal Conjugate-13 08/10/2015   Pneumococcal Polysaccharide-23 05/08/2006, 03/22/2020   Tdap 01/31/2011    TDAP status: Due, Education has been provided regarding the importance of this vaccine. Advised may receive this vaccine at local pharmacy or Health Dept. Aware to provide a copy of the vaccination record if obtained from local pharmacy or Health Dept. Verbalized acceptance and understanding.   Covid-19 vaccine status: Declined, Education has been provided regarding the importance of this vaccine but patient still declined. Advised may receive this vaccine at local pharmacy or Health Dept.or vaccine clinic. Aware to provide a copy of the vaccination record if obtained from local pharmacy or Health Dept. Verbalized acceptance and understanding.  Qualifies for Shingles Vaccine? Yes   Zostavax completed No   Shingrix Completed?: No.    Education has been provided regarding the importance of this vaccine. Patient has been advised to call insurance company to determine out of pocket expense if they have not yet received this vaccine. Advised may also receive vaccine at local pharmacy  or Health Dept. Verbalized acceptance and understanding.  Screening Tests Health Maintenance  Topic Date Due   COVID-19 Vaccine (3 - Booster for Pfizer series) 04/10/2021 (Originally 09/19/2019)   Zoster Vaccines- Shingrix (1 of 2) 06/23/2021 (Originally 02/05/2004)   INFLUENZA VACCINE  07/05/2021 (Originally 11/05/2020)   MAMMOGRAM  03/25/2022 (Originally 08/08/2018)   TETANUS/TDAP  03/25/2022 (Originally 01/30/2021)   Hepatitis C Screening  03/25/2022 (Originally 02/05/1974)   COLONOSCOPY (Pts 45-58yrs Insurance coverage will need to be confirmed)  05/15/2023   Pneumonia Vaccine 47+ Years old  Completed   DEXA SCAN  Completed   HPV VACCINES  Aged Out    Health Maintenance  There are no preventive care reminders to display for this patient.   MAMAGRAM: Patient deferred  Additional Screening:  Hepatitis C Screening: does qualify; Completed No  Vision Screening: Recommended annual ophthalmology exams for early detection of glaucoma and other disorders of the eye. Is the patient up to date with their annual eye exam?  Yes  Who is the provider or what is the name of the office in which the patient attends annual eye exams? Followed by [facility]   Dental Screening: Recommended annual dental exams for proper oral Research scientist (life sciences) Referral / Chronic Care Management:  CRR required this visit?  No   CCM required this visit?  No      Plan:     I have personally  reviewed and noted the following in the patients chart:   Medical and social history Use of alcohol, tobacco or illicit drugs  Current medications and supplements including opioid prescriptions. Patient currently taking opioids Functional ability and status Nutritional status Physical activity Advanced directives List of other physicians Hospitalizations, surgeries, and ER visits in previous 12 months Vitals Screenings to include cognitive, depression, and falls Referrals and appointments  In  addition, I have reviewed and discussed with patient certain preventive protocols, quality metrics, and best practice recommendations. A written personalized care plan for preventive services as well as general preventive health recommendations were provided to patient.     Criselda Peaches, LPN   [id number]     I have reviewed and agree with this plan   [name], NP

## 2021-03-25 NOTE — Telephone Encounter (Signed)
Patient drop off form to have handicap placard filled out by [facility]. Preferences on what to do with form upon completion are attached. Has been placed in folder.    Please advise

## 2021-03-27 ENCOUNTER — Telehealth: Payer: Self-pay

## 2021-03-27 NOTE — Telephone Encounter (Signed)
Called patient and informed paperwork for parking placard is ready for pick up.

## 2021-03-28 NOTE — Telephone Encounter (Signed)
Form is ready for pick up and the patient is aware. 

## 2021-04-03 ENCOUNTER — Encounter: Payer: Self-pay | Admitting: Adult Health

## 2021-04-03 ENCOUNTER — Ambulatory Visit (INDEPENDENT_AMBULATORY_CARE_PROVIDER_SITE_OTHER): Payer: Medicare Other | Admitting: Adult Health

## 2021-04-03 VITALS — BP 140/70 | HR 88 | Temp 98.2°F | Ht 61.0 in | Wt 135.0 lb

## 2021-04-03 DIAGNOSIS — I2510 Atherosclerotic heart disease of native coronary artery without angina pectoris: Secondary | ICD-10-CM

## 2021-04-03 DIAGNOSIS — R569 Unspecified convulsions: Secondary | ICD-10-CM

## 2021-04-03 MED ORDER — CYCLOBENZAPRINE HCL 10 MG PO TABS: 10.0000 mg | ORAL_TABLET | Freq: Every day | ORAL | 0 refills | Status: DC

## 2021-04-03 MED ORDER — NYSTATIN 100000 UNIT/ML MT SUSP: 5.0000 mL | Freq: Four times a day (QID) | OROMUCOSAL | 1 refills | Status: DC | PRN

## 2021-04-03 NOTE — Progress Notes (Signed)
Subjective:    Patient ID: Kim [name], female    DOB: [birth year], 65 y.o.   MRN: [id number]  HPI  65 year old female who  has a past medical history of C8 RADICULOPATHY (06/05/2009), COPD (08/31/2007), DEPRESSION (11/19/2006), DYSLIPIDEMIA (03/12/2009), MI (mitral incompetence), NEPHROLITHIASIS (01/05/2008), OSTEOARTHRITIS (06/05/2009), PARESTHESIA (08/24/2007), and TOBACCO ABUSE (01/25/2010).  She presents to the office for syncopal episode.   She reports that over the last 10 months she has been having episodes of lightheadedness causing falling.   Two days ago she was walking into the bathroom and the next thing she remembers she was on the floor waking up. Believes she was out just a few seconds. This is the first time she had a syncopal episode.   When these episodes have happened in the past she " cannot talk and will gaze into space". Does not endorse jerking or shaking sensations. She has had a few episodes of loss of bladder during these episodes. 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   OSTEOARTHRITIS 06/05/2009   PARESTHESIA 08/24/2007   TOBACCO ABUSE 01/25/2010    Social History   Socioeconomic History   Marital status: Married    Spouse name: [name]   Number of children: 2   Years of education: Not on file   Highest education level: Not on file  Occupational History   Occupation: Patient care aide-sits with elderly lady  Tobacco Use   Smoking status: Former    Packs/day: 2.00    Years: 38.00    Pack years: 76.00    Types: Cigarettes    Quit date: 03/07/2014    Years since quitting: 7.0   Smokeless tobacco: Never  Vaping Use   Vaping Use: Never used  Substance and Sexual Activity    Alcohol use: No    Alcohol/week: 0.0 standard drinks   Drug use: No   Sexual activity: Not on file  Other Topics Concern   Not on file  Social History Narrative   [facility] (01/12/17):   Originally from [facility]. Has lived in [place] for 17 years. Married. Has 2 dogs currently. No mold and only 1 indoor plant. Had her home professionally cleaned a month ago. No bird or hot tub exposure. 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    Social Determinants of Health   Financial Resource Strain: Low Risk    Difficulty of Paying Living Expenses: Not hard at all  Food Insecurity: No Food Insecurity   Worried About Charity fundraiser in the Last Year: Never true   [place] in the Last Year: Never true  Transportation Needs: No Transportation Needs   Lack of Transportation (Medical): No   Lack of Transportation (Non-Medical): No  Physical Activity: Inactive   Days of Exercise per Week: 0 days   Minutes of Exercise per Session: 0 min  Stress: No Stress Concern Present   Feeling of Stress : Not at all  Social Connections: Moderately Isolated   Frequency of Communication with Friends and Family: More than three times a week   Frequency of Social Gatherings with Friends and Family: More than three times a week  Attends Religious Services: Never   Active Member of Clubs or Organizations: No   Attends Archivist Meetings: Never   Marital Status: Married  Human resources officer Violence: Not At Risk   Fear of Current or Ex-Partner: No   Emotionally Abused: No   Physically Abused: No   Sexually Abused: No    Past Surgical History:  Procedure Laterality Date   ABDOMINAL HYSTERECTOMY     APPENDECTOMY  1969   CARDIAC CATHETERIZATION N/A 07/05/2015   Procedure: Left Heart Cath and Coronary Angiography;  Surgeon: Leonie Man, MD;  Location: [facility];  Service: Cardiovascular;  Laterality: N/A;   CHOLECYSTECTOMY  1989   collapse lung  1990    COLONOSCOPY WITH PROPOFOL N/A 05/14/2018   Procedure: COLONOSCOPY WITH PROPOFOL;  Surgeon: Irene Shipper, MD;  Location: [facility];  Service: Endoscopy;  Laterality: N/A;   EXCISION MASS ABDOMINAL  2019   EXPLORATORY LAPAROTOMY  1969   LEFT HEART CATH AND CORONARY ANGIOGRAPHY N/A 11/19/2020   Procedure: LEFT HEART CATH AND CORONARY ANGIOGRAPHY;  Surgeon: [name], MD;  Location: [facility];  Service: Cardiovascular;  Laterality: N/A;   POLYPECTOMY  05/14/2018   Procedure: POLYPECTOMY;  Surgeon: Irene Shipper, MD;  Location: [facility];  Service: Endoscopy;;   SALPINGOOPHORECTOMY  2019    Family History  Problem Relation Age of Onset   Cancer Mother        lung   Heart attack Mother    Hypertension Sister    Multiple sclerosis Brother    Diabetes Sister    Rheum arthritis Sister    Thyroid disease Sister    Multiple sclerosis Other    Alcohol abuse Other    Arthritis Other    Diabetes Other    Kidney disease Other    Cancer Other        lung,ovarian,skin, uterine   Stroke Other    Heart disease Other    Melanoma Other    Osteoporosis Other    Heart attack Maternal Uncle    Heart attack Other        [place]   Heart attack Other        NEPHEW   Hypertension Brother    Stroke Maternal Grandmother    Colon cancer Neg Hx     Allergies  Allergen Reactions   Tape Other (See Comments)    SKIN TEARS EASILY!!!!   Ciprofloxacin Other (See Comments)    Hallucinations    Lisinopril Cough   Penicillins Hives and Other (See Comments)    Tolerates Omnicef Has patient had a PCN reaction causing immediate rash, facial/tongue/throat swelling, SOB or lightheadedness with hypotension: Yes Has patient had a PCN reaction causing severe rash involving mucus membranes or skin necrosis: No Has patient had a PCN reaction that required hospitalization No Has patient had a PCN reaction occurring within the last 10 years: No If all of the above answers are "NO", then may proceed  with Cephalosporin use.     Current Outpatient Medications on File Prior to Visit  Medication Sig Dispense Refill   albuterol (VENTOLIN HFA) 108 (90 Base) MCG/ACT inhaler Take two puffs every 4-6 hours as needed for shortness of breath/wheezing . 90 day supply 162 g 1   ALPRAZolam (XANAX) 0.5 MG tablet TAKE 1/2 TO 1 TABLET BY MOUTH THREE TIMES DAILY 60 tablet 2   aspirin EC 81 MG EC tablet Take 1 tablet (81 mg total) by mouth daily.  cetirizine (ZYRTEC) 10 MG tablet Take 10 mg by mouth daily.     furosemide (LASIX) 40 MG tablet Take 1 tablet (40 mg total) by mouth daily as needed (for an overnight weight gain of 3-5 pounds of fluid).     HYDROcodone-acetaminophen (NORCO) 7.5-325 MG tablet Take 1 tablet by mouth every 6 (six) hours as needed for moderate pain. 45 tablet 0   ipratropium-albuterol (DUONEB) 0.5-2.5 (3) MG/3ML SOLN USE 3 ML VIA NEBULIZER EVERY 6 HOURS AS NEEDED.  DX: J44.9 360 mL 5   losartan (COZAAR) 50 MG tablet Take 50 mg by mouth daily.     magnesium oxide (MAG-OX) 400 MG tablet Take 400 mg by mouth daily.     metoprolol succinate (TOPROL-XL) 25 MG 24 hr tablet Take 0.5 tablets (12.5 mg total) by mouth daily. 45 tablet 3   MUCINEX MAXIMUM STRENGTH 1200 MG TB12 Take 1,200 mg by mouth every 12 (twelve) hours.     nitroGLYCERIN (NITROSTAT) 0.4 MG SL tablet Place 1 tablet (0.4 mg total) under the tongue every 5 (five) minutes as needed for chest pain. 25 tablet 3   nystatin (MYCOSTATIN) 100000 UNIT/ML suspension Take 5 mLs (500,000 Units total) by mouth 4 (four) times daily as needed (for thrush).     OXYGEN Inhale 2 L/min into the lungs continuous.     PARoxetine (PAXIL) 30 MG tablet TAKE 1 TABLET EVERY DAY (Patient taking differently: Take 30 mg by mouth in the morning.) 90 tablet 1   Spacer/Aero Chamber Mouthpiece MISC 1 Device by Does not apply route as directed. 1 each 0   SYMBICORT 160-4.5 MCG/ACT inhaler INHALE 2 PUFFS TWICE DAILY 30 g 3   Tiotropium Bromide Monohydrate  (SPIRIVA RESPIMAT) 2.5 MCG/ACT AERS Inhale 2 puffs into the lungs daily. 4 g 5   doxycycline (VIBRA-TABS) 100 MG tablet Take 1 tablet (100 mg total) by mouth 2 (two) times daily. (Patient not taking: Reported on 04/03/2021) 14 tablet 0   predniSONE (DELTASONE) 10 MG tablet 40 mg x 3 days, 20 mg x 3 days, 10 mg x 3 days (Patient not taking: Reported on 04/03/2021) 21 tablet 0   predniSONE (DELTASONE) 10 MG tablet Take 4 tabs daily x 4 days, then 3 tabs daily x 4 days, then 2 tabs daily x 4 days, then 1 tab daily x4 days then stop. #60 (Patient not taking: Reported on 04/03/2021) 60 tablet 0   Current Facility-Administered Medications on File Prior to Visit  Medication Dose Route Frequency Provider Last Rate Last Admin   0.9 %  sodium chloride infusion   Intravenous PRN Pickenpack-Cousar, Athena N, NP        BP 140/70    Pulse 88    Temp 98.2 F (36.8 C) (Oral)    Ht 5\' 1"  (1.549 m)    Wt 135 lb (61.2 kg)    SpO2 96% Comment: just got off  2L of O2   BMI 25.51 kg/m       Objective:   Physical Exam Vitals and nursing note reviewed.  Constitutional:      Appearance: Normal appearance.  Eyes:     Extraocular Movements: Extraocular movements intact.     Conjunctiva/sclera: Conjunctivae normal.     Pupils: Pupils are equal, round, and reactive to light.  Cardiovascular:     Rate and Rhythm: Normal rate and regular rhythm.     Pulses: Normal pulses.     Heart sounds: Normal heart sounds.  Pulmonary:     Effort:  Pulmonary effort is normal.     Breath sounds: Normal breath sounds. No wheezing.  Musculoskeletal:        General: Normal range of motion.  Skin:    General: Skin is warm and dry.     Capillary Refill: Capillary refill takes less than 2 seconds.  Neurological:     General: No focal deficit present.     Mental Status: She is alert and oriented to person, place, and time.      Assessment & Plan:  1. Seizure-like activity ([place]) - Concern for epilepsy. Will refer to neurology   - Falls precautions reviewed - Ambulatory referral to Neurology  [name], NP  Time spent on chart review, time with patient; discussion of  epilepsy, home monitoring treatment, follow up plan, and documentation 30 minutes

## 2021-04-04 ENCOUNTER — Encounter: Payer: Self-pay | Admitting: Neurology

## 2021-04-10 ENCOUNTER — Encounter: Payer: Self-pay | Admitting: Primary Care

## 2021-04-10 ENCOUNTER — Telehealth (INDEPENDENT_AMBULATORY_CARE_PROVIDER_SITE_OTHER): Payer: Medicare Other | Admitting: Primary Care

## 2021-04-10 DIAGNOSIS — J9611 Chronic respiratory failure with hypoxia: Secondary | ICD-10-CM | POA: Diagnosis not present

## 2021-04-10 DIAGNOSIS — J441 Chronic obstructive pulmonary disease with (acute) exacerbation: Secondary | ICD-10-CM

## 2021-04-10 MED ORDER — PREDNISONE 10 MG PO TABS: ORAL_TABLET | ORAL | 0 refills | Status: DC

## 2021-04-10 MED ORDER — AZITHROMYCIN 250 MG PO TABS: ORAL_TABLET | ORAL | 0 refills | Status: DC

## 2021-04-10 NOTE — Progress Notes (Signed)
Virtual Visit via Video Note  I connected with Kim [name] on 04/10/21 at 10:00 AM EST by a video enabled telemedicine application and verified that I am speaking with the correct person using two identifiers.  Location: Patient: Home Provider: Office    I discussed the limitations of evaluation and management by telemedicine and the availability of in person appointments. The patient expressed understanding and agreed to proceed.  History of Present Illness: 66 year old female, former smoker quit in 2015 (76-pack-year history).  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  Patient of Dr. Elsworth Soho, last seen in office on 03/05/21.    Previous [facility] pulmonary encounter: 03/06/21- Dr. Elsworth Soho  66 yo ex smoker  for follow-up of severe COPD PMH- Takatsubo cardiomyopathy 2015, 2017 and 2022, osteoporosis ,vertebral fractures Alpha-1 carrier?   COPD exacerbations: June 2020- COPD exacerbation/Prednisone + zpack September- prednisone PCP December 2020- Prednisone + zpack ? PCP January 18th 2021- COPD exacerbation/Prednisone + doxycycline  Feb 5th 2021- COPD exacerbation/Prednisone + Zpack  Feb 9th 2021- Prolonged COPD exacerbation requiring high dose prednisone/ CXR ordered  April 4th- Hospitalized for COPD exacerbation treated with IV steriods and oral prednisone taper 08/12/2019 - Zpack and extrended prednisone taper    09/2019>> low-dose steroids - tapered to off by end August.   01/2020 OV >> pred 40 mg, settling at 10 03/2020 hospitalized for 10 days for COPD exacerbation 09/2020 developed COVID infection, received monoclonal antibody She was admitted from 11/2020 for COPD with acute exacerbation and stress-induced cardiomyopathy/acute systolic heart  failure/type II myocardial infarction secondary to cardiomyopathy     Last OV 12/2020 >> daliresp added Switched back to symbicort + spiriva instead of Breztri due to hoarseness EF was noted to have dropped to 35% during hospitalization but now has recovered to 55 to 60% on repeat echo.   She developed skin reaction with peeling and she stopped most of her medications including Daliresp    Last OV >> Rx sent for new Inogen tank, she is now on oxygen 24/7, resumed symbicort & spiriva   She is here for an acute visit today.  Neighbor was burning something in the fumes got to her, has been wheezing and coughing, minimal amount of white to beige sputum last 3 days, she started taking prednisone 40 mg for last 2 days and is still wheezing.  Has to increase oxygen to 3 L  She is still not received portable oxygen from [facility] and requests of new prescription be sent to them   04/10/2021- Interim hx  Patient contacted today for acute virtual visit. Attempted to contact to video, able to see patient but no access to audio so this was change to televisit. Patient developed cough with associated shortness of breath and wheezing two days ago. Cough is productive with clear-beige mucus. She is compliant with Symbicort and Spiriva. She has been using her nebulizer 3-5 times a day with temporary improvement. She is also taking mucinex 1200mg  twice a day and using flutter valve. She is using 2-3L oxygen. She tells me that she has not received humidification for oxygen. She was ordered for Education officer, environmental during last visit.    Observations/Objective:  - Able to speak in full sentences; No overt shortness of breath or wheezing. Occasional cough  - SpO2 readings currently 92% 2L   Significant tests/ events reviewed 01/2017 alpha-1 >>  102 nml level ,PI* M1N   11/2014: FVC 2.00 L (63%) FEV1 1.00 L (41%) ratio 50  negative bronchodilator response TLC 5.06 L (103%) RV 140% DLCO uncorrected 51%   CT angiogram  chest 09/2020 no airspace disease   CT angiogram chest 03/24/2020  Severe centrilobular emphysema with mild superimposed diffuse ground-glass opacity with an apical predominance, similar in comparison to prior study. Findings likely reflect smoking related lung disease/respiratory bronchiolitis. New mild compression fracture deformity of L1 since 10/ 2021 01/2020 LDCT - stable, T7 fracture 12/2018 LD CT chest - stable nodules   10/2017 CT chest-stable nodules, adrenal adenoma 10/2016 CT chest showed subcentimeter nodules which have been stable since 2017, right adrenal nodule was stable since 2015 and considered benign   Assessment and Plan:  COPD exacerbation: - Symptoms flare every 3-4 weeks; Last treated for COPD on 03/05/21 with Doxycycline and Prednisone taper. Developed productive cough with associated sob/wheezing two days ago. No signs of active infection/sputum is non-purulent and she is afebrile. Recommend treating acute exacerbation with prednisone taper, if symptoms worsen or do not improve she can take Zpack prescribed to have on hold. Continue Symbicort 181mcg two puffs twice daily, Spiriva Respimat 2.84mcg two puff daily, mucinex 1200mg  twice daily and flutter valve/IS.   Follow Up Instructions:  February with Dr. Elsworth Soho    I discussed the assessment and treatment plan with the patient. The patient was provided an opportunity to ask questions and all were answered. The patient agreed with the plan and demonstrated an understanding of the instructions.   The patient was advised to call back or seek an in-person evaluation if the symptoms worsen or if the condition fails to improve as anticipated.  I provided 22 minutes of non-face-to-face time during this encounter.   Martyn Ehrich, NP

## 2021-04-19 ENCOUNTER — Other Ambulatory Visit: Payer: Self-pay

## 2021-04-19 ENCOUNTER — Encounter: Payer: Self-pay | Admitting: Neurology

## 2021-04-19 ENCOUNTER — Ambulatory Visit (INDEPENDENT_AMBULATORY_CARE_PROVIDER_SITE_OTHER): Payer: Medicare Other | Admitting: Neurology

## 2021-04-19 VITALS — BP 118/67 | HR 82 | Ht 62.0 in | Wt 136.2 lb

## 2021-04-19 DIAGNOSIS — R42 Dizziness and giddiness: Secondary | ICD-10-CM

## 2021-04-19 DIAGNOSIS — R55 Syncope and collapse: Secondary | ICD-10-CM

## 2021-04-19 NOTE — Progress Notes (Signed)
NEUROLOGY CONSULTATION NOTE  Kim [name] MRN: [id number] DOB: 1955-09-06  Referring provider: [name], NP Primary care provider: [name], NP  Reason for consult:  seizure-like activity  Thank you for your kind referral of Kim [name] for consultation of the above symptoms. Although her history is well known to you, please allow me to reiterate it for the purpose of our medical record. The patient was accompanied to the clinic by her husband Kim [name] who also provides collateral information. Records and images were personally reviewed where available.   HISTORY OF PRESENT ILLNESS: This is a 66 year old right-handed woman with a history of hyperlipidemia, nephrolithiasis, COPD, radiculopathy, depression, presenting for evaluation of seizure-like activity. She had an episode of loss of consciousness on 04/01/21, she was walking to the bathroom then woke up on the floor. She has had a lot of dizzy spells that started in 05/2020, they were waiting their turn for taxes then "everything went," she felt lightheaded and slid down the floor with no loss of consciousness. 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 They have been increasing in frequency, she had had 3 in the past 3-4 weeks where she would let herself down on the floor. She states she cannot talk but can understand people around her. Her husband notes she has a blank look with most of the episodes. They all occur when she first gets up, no episodes when supine. On 04/01/21, she recalls needing to use the bathroom, as she got to the bathroom door, she felt the same sensation, turned the lights on, then woke up on the floor. Her husband reports she hollered for him and found her on the floor with urinary incontinence, she was awake when he arrived. She states there is 1-2 seconds she does not remember, but with the other episode she is quite aware of  what happened. Her husband has not witnessed any convulsive activity.  She states she was on several medications that may have affected her (citing Losartan, Mucinex, Zyrtec, Lasix). When she looks up, she briefly feels lightheaded. She gets lightheaded every 2 days but states she has not lost consciousness since 04/01/21. In the past, her BP would be 70-80/40, then back up as soon as it is over. She reports having labile BP but current medication combination has helped with this. She denies any olfactory/gustatory hallucinations, myoclonic jerks.   She has rare headaches, however since December she has had more headaches that she attributes to her hospitalization in December for COPD where she had headaches the entire time, she states it occurred after her Pneumovax shot. Since then, she has had 1-2 headaches a month. For the past 2-3 months, her left eye is a "complete fog," with a big blur on her vision. She has neck pain and left arm gets numb. She denies any dysarthria/dysphagia, back pain, bowel/bladder dysfunction. She has a lot of hand tremors. She has palpitations and sees Cardiology, but has not alerted them of her syncopal episode. Sleep is good. A maternal first cousin has epilepsy. Her son had epilepsy for 3 years treated with Dilantin. Otherwise she had a normal birth and early development.  There is no history of febrile convulsions, CNS infections such as meningitis/encephalitis, significant traumatic brain injury, neurosurgical procedures  EKG in 11/2020 reported sinus tachycardia (102), probable left atrial enlargerment, abnormal lateral Q waves, old anterior infarct.   PAST  MEDICAL HISTORY: 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   OSTEOARTHRITIS 06/05/2009   PARESTHESIA 08/24/2007   TOBACCO ABUSE 01/25/2010    PAST SURGICAL  HISTORY: Past Surgical History:  Procedure Laterality Date   ABDOMINAL HYSTERECTOMY     APPENDECTOMY  1969   CARDIAC CATHETERIZATION N/A 07/05/2015   Procedure: Left Heart Cath and Coronary Angiography;  Surgeon: Leonie Man, MD;  Location: [facility];  Service: Cardiovascular;  Laterality: N/A;   CHOLECYSTECTOMY  1989   collapse lung  1990   COLONOSCOPY WITH PROPOFOL N/A 05/14/2018   Procedure: COLONOSCOPY WITH PROPOFOL;  Surgeon: Irene Shipper, MD;  Location: [facility];  Service: Endoscopy;  Laterality: N/A;   EXCISION MASS ABDOMINAL  2019   EXPLORATORY LAPAROTOMY  1969   LEFT HEART CATH AND CORONARY ANGIOGRAPHY N/A 11/19/2020   Procedure: LEFT HEART CATH AND CORONARY ANGIOGRAPHY;  Surgeon: [name], MD;  Location: [facility];  Service: Cardiovascular;  Laterality: N/A;   POLYPECTOMY  05/14/2018   Procedure: POLYPECTOMY;  Surgeon: Irene Shipper, MD;  Location: [facility];  Service: Endoscopy;;   SALPINGOOPHORECTOMY  2019    MEDICATIONS: Current Outpatient Medications on File Prior to Visit  Medication Sig Dispense Refill   albuterol (VENTOLIN HFA) 108 (90 Base) MCG/ACT inhaler Take two puffs every 4-6 hours as needed for shortness of breath/wheezing . 90 day supply 162 g 1   ALPRAZolam (XANAX) 0.5 MG tablet TAKE 1/2 TO 1 TABLET BY MOUTH THREE TIMES DAILY 60 tablet 2   aspirin EC 81 MG EC tablet Take 1 tablet (81 mg total) by mouth daily.     cetirizine (ZYRTEC) 10 MG tablet Take 10 mg by mouth daily.     furosemide (LASIX) 40 MG tablet Take 1 tablet (40 mg total) by mouth daily as needed (for an overnight weight gain of 3-5 pounds of fluid).     HYDROcodone-acetaminophen (NORCO) 7.5-325 MG tablet Take 1 tablet by mouth every 6 (six) hours as needed for moderate pain. 45 tablet 0   ipratropium-albuterol (DUONEB) 0.5-2.5 (3) MG/3ML SOLN USE 3 ML VIA NEBULIZER EVERY 6 HOURS AS NEEDED.  DX: J44.9 360 mL 5   losartan (COZAAR) 50 MG tablet Take 50 mg by mouth  daily.     magnesium oxide (MAG-OX) 400 MG tablet Take 400 mg by mouth daily.     metoprolol succinate (TOPROL-XL) 25 MG 24 hr tablet Take 0.5 tablets (12.5 mg total) by mouth daily. 45 tablet 3   MUCINEX MAXIMUM STRENGTH 1200 MG TB12 Take 1,200 mg by mouth every 12 (twelve) hours.     nitroGLYCERIN (NITROSTAT) 0.4 MG SL tablet Place 1 tablet (0.4 mg total) under the tongue every 5 (five) minutes as needed for chest pain. 25 tablet 3   nystatin (MYCOSTATIN) 100000 UNIT/ML suspension Take 5 mLs (500,000 Units total) by mouth 4 (four) times daily as needed (for thrush). 60 mL 1   OXYGEN Inhale 2 L/min into the lungs continuous.     PARoxetine (PAXIL) 30 MG tablet TAKE 1 TABLET EVERY DAY (Patient taking differently: Take 30 mg by mouth in the morning.) 90 tablet 1   predniSONE (DELTASONE) 10 MG tablet Take 4 tabs daily x 4 days, then 3 tabs daily x 4 days, then 2 tabs daily x 4 days, then 1 tab daily x4 days  then stop. #60 60 tablet 0   Spacer/Aero Chamber Mouthpiece MISC 1 Device by Does not apply route as directed. 1 each 0   SYMBICORT 160-4.5 MCG/ACT inhaler INHALE 2 PUFFS TWICE DAILY 30 g 3   Tiotropium Bromide Monohydrate (SPIRIVA RESPIMAT) 2.5 MCG/ACT AERS Inhale 2 puffs into the lungs daily. 4 g 5   Current Facility-Administered Medications on File Prior to Visit  Medication Dose Route Frequency Provider Last Rate Last Admin   0.9 %  sodium chloride infusion   Intravenous PRN Pickenpack-Cousar, Athena N, NP        ALLERGIES: Allergies  Allergen Reactions   Tape Other (See Comments)    SKIN TEARS EASILY!!!!   Ciprofloxacin Other (See Comments)    Hallucinations    Lisinopril Cough   Penicillins Hives and Other (See Comments)    Tolerates Omnicef Has patient had a PCN reaction causing immediate rash, facial/tongue/throat swelling, SOB or lightheadedness with hypotension: Yes Has patient had a PCN reaction causing severe rash involving mucus membranes or skin necrosis: No Has patient  had a PCN reaction that required hospitalization No Has patient had a PCN reaction occurring within the last 10 years: No If all of the above answers are "NO", then may proceed with Cephalosporin use.     FAMILY HISTORY: Family History  Problem Relation Age of Onset   Cancer Mother        lung   Heart attack Mother    Hypertension Sister    Multiple sclerosis Brother    Diabetes Sister    Rheum arthritis Sister    Thyroid disease Sister    Multiple sclerosis Other    Alcohol abuse Other    Arthritis Other    Diabetes Other    Kidney disease Other    Cancer Other        lung,ovarian,skin, uterine   Stroke Other    Heart disease Other    Melanoma Other    Osteoporosis Other    Heart attack Maternal Uncle    Heart attack Other        NEICE   Heart attack Other        NEPHEW   Hypertension Brother    Stroke Maternal Grandmother    Colon cancer Neg Hx     SOCIAL HISTORY: Social History   Socioeconomic History   Marital status: Married    Spouse name: Kim [name]   Number of children: 2   Years of education: Not on file   Highest education level: Not on file  Occupational History   Occupation: Patient care aide-sits with elderly lady  Tobacco Use   Smoking status: Former    Packs/day: 2.00    Years: 38.00    Pack years: 76.00    Types: Cigarettes    Quit date: 03/07/2014    Years since quitting: 7.1   Smokeless tobacco: Never  Vaping Use   Vaping Use: Never used  Substance and Sexual Activity   Alcohol use: No    Alcohol/week: 0.0 standard drinks   Drug use: No   Sexual activity: Not on file  Other Topics Concern   Not on file  Social History Narrative   [facility] (01/12/17):   Originally from [facility]. Has lived in [place] for 17 years. Married. Has 2 dogs currently. No mold and only 1 indoor plant. Had her home professionally cleaned a month ago. No bird or hot tub exposure. 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  Right handed     Social Determinants of Health   Financial Resource Strain: Low Risk    Difficulty of Paying Living Expenses: Not hard at all  Food Insecurity: No Food Insecurity   Worried About Charity fundraiser in the Last Year: Never true   Ran Out of Food in the Last Year: Never true  Transportation Needs: No Transportation Needs   Lack of Transportation (Medical): No   Lack of Transportation (Non-Medical): No  Physical Activity: Inactive   Days of Exercise per Week: 0 days   Minutes of Exercise per Session: 0 min  Stress: No Stress Concern Present   Feeling of Stress : Not at all  Social Connections: Moderately Isolated   Frequency of Communication with Friends and Family: More than three times a week   Frequency of Social Gatherings with Friends and Family: More than three times a week   Attends Religious Services: Never   Marine scientist or Organizations: No   Attends Music therapist: Never   Marital Status: Married  Human resources officer Violence: Not At Risk   Fear of Current or Ex-Partner: No   Emotionally Abused: No   Physically Abused: No   Sexually Abused: No     PHYSICAL EXAM: Vitals:   04/19/21 1008  BP: 118/67  Pulse: 82  SpO2: 95%   Orthostatic VS: Supine BP 140/68, HR 72; Sitting BP 140/70, HR 75; Standing BP 110/70, HR 81 General: No acute distress, on O2 via [place] Head:  Normocephalic/atraumatic Skin/Extremities: No rash, no edema Neurological Exam: Mental status: alert and oriented to person, place, and time, no dysarthria or aphasia, Fund of knowledge is appropriate.  Recent and remote memory are intact.  Attention and concentration are normal.     Cranial nerves: CN I: not tested CN II: pupils equal, round and reactive to light, visual fields intact CN III, IV, VI:  full range of motion, no nystagmus, no ptosis CN V: facial sensation intact CN VII: upper and lower face symmetric CN VIII: hearing intact to conversation Bulk & Tone: normal, no  fasciculations. Motor: 5/5 throughout with no pronator drift. Sensation: intact to light touch, cold, pin, vibration sense.  No extinction to double simultaneous stimulation.  Romberg test  Deep Tendon Reflexes: +1 throughout Cerebellar: no incoordination on finger to nose testing Gait: narrow-based and steady, no ataxia Tremor: no resting tremor, +bilateral postural and endpoint tremor   IMPRESSION: This is a 66 year old right-handed woman with a history of hyperlipidemia, nephrolithiasis, COPD, radiculopathy, depression, presenting for evaluation of seizure-like activity.  She has recurrent episodes of dizziness when standing, some with associated blank stare but responsive. She had a syncopal episode in the bathroom on 04/01/21. She is orthostatic in the office today with a 30-point drop in BP from sitting to standing, asymptomatic. Symptoms suggestive of vasovagal near syncope/syncope, less likely seizure. From a neurological standpoint, we will do a brain MRI with and without contrast and 1-hour EEG. If normal, we will do a 48-hour EEG for classification. She was advised to speak to her Cardiologist as well. Advised increasing hydration, counterpressure maneuvers. Follow-up after tests, call for any changes.    Thank you for allowing me to participate in the care of this patient. Please do not hesitate to call for any questions or concerns.   Ellouise Newer, M.D.  CC: [name], NP, Richardson Dopp, PA-C

## 2021-04-19 NOTE — Patient Instructions (Signed)
Good to meet you!  Schedule MRI brain with and without contrast  2. Schedule 1-hour EEG, if normal we will do a 2-day EEG  3. Try the counterpressure exercises and increase hydration  4. Follow-up after tests, call for any changes

## 2021-04-26 ENCOUNTER — Telehealth: Payer: Self-pay | Admitting: Physician Assistant

## 2021-04-26 MED ORDER — LOSARTAN POTASSIUM 25 MG PO TABS: 25.0000 mg | ORAL_TABLET | Freq: Every day | ORAL | 3 refills | Status: DC

## 2021-04-26 NOTE — Telephone Encounter (Signed)
Called pt reviewed Provider instructions.  Pt verbalizes understanding.  Pt does not need script for losartan at this time will 1/2 50 mg tablets.  Pt will monitor BP 1.5-2 hours after taking BP meds and sitting relaxed for a few minutes.  Will call in or send readings through my chart in 2 weeks.  Pt will purchase compression socks.  I explained to pt the reason for wearing compression socks.  Pt will call in for an OV if dizziness continues to occur after medication change.  Pt had no further questions or concerns.

## 2021-04-26 NOTE — Telephone Encounter (Signed)
Received note from recent visit with Dr. Delice Lesch. Patient had evidence of ortho hypotension on exam.  She had recent hx of postural dizziness and syncope. BPs have run low in the past.  She has had  a hard time tolerating GDMT due to low BP. PLAN: -Decrease Losartan to 25 mg once daily -Monitor BP and send BP readings after 2 weeks. -Wear compression stockings  -If dizziness with standing continues, arrange f/u visit. Richardson Dopp, PA-C    04/26/2021 1:09 PM

## 2021-04-29 ENCOUNTER — Ambulatory Visit (INDEPENDENT_AMBULATORY_CARE_PROVIDER_SITE_OTHER): Payer: Medicare Other | Admitting: Family Medicine

## 2021-04-29 ENCOUNTER — Encounter: Payer: Self-pay | Admitting: Family Medicine

## 2021-04-29 VITALS — BP 100/60 | HR 89 | Temp 97.9°F | Ht 62.0 in | Wt 139.4 lb

## 2021-04-29 DIAGNOSIS — R300 Dysuria: Secondary | ICD-10-CM | POA: Diagnosis not present

## 2021-04-29 DIAGNOSIS — N3000 Acute cystitis without hematuria: Secondary | ICD-10-CM | POA: Diagnosis not present

## 2021-04-29 DIAGNOSIS — J441 Chronic obstructive pulmonary disease with (acute) exacerbation: Secondary | ICD-10-CM | POA: Diagnosis not present

## 2021-04-29 LAB — POCT URINALYSIS DIPSTICK
Bilirubin, UA: NEGATIVE
Glucose, UA: NEGATIVE
Ketones, UA: NEGATIVE
Leukocytes, UA: NEGATIVE
Nitrite, UA: NEGATIVE
Protein, UA: POSITIVE — AB
Spec Grav, UA: 1.02 (ref 1.010–1.025)
Urobilinogen, UA: 0.2 E.U./dL
pH, UA: 7.5 (ref 5.0–8.0)

## 2021-04-29 MED ORDER — CEFDINIR 300 MG PO CAPS: 300.0000 mg | ORAL_CAPSULE | Freq: Two times a day (BID) | ORAL | 0 refills | Status: DC

## 2021-04-29 MED ORDER — PREDNISONE 10 MG PO TABS: ORAL_TABLET | ORAL | 0 refills | Status: DC

## 2021-04-29 NOTE — Progress Notes (Signed)
Kim [name] DOB: [birth year] Encounter date: 04/29/2021  This is a 66 y.o. female who presents with  Chief Complaint  Patient presents with   Dysuria    Patient complains of urinary frequency and burning with urination x3-4 days    Usually if she gets infection, she knows why. Has had copd exacerbation last few weeks - coughs more and then wears pad so wonders if that contributed. She has been drinking a lot of fluids. No abd pain, but does have lower pressure. Increased frequency. No fevers, no chills. No back pain.   Just finished prednisone in taper yesterday. Has this on hand for use if needed. Also took azithromycin about 3 weeks ago. Felt somewhat better on treatment, but still feeling more shortness of breath/chest congestion than she feels isher normal. She tracks carefully and does spirometry daily. She generally wears 2L o2 during day. Objective:   Review of Systems  Constitutional:  Negative for chills and fever.  Respiratory:  Positive for cough, sputum production, shortness of breath and wheezing.   Genitourinary:  Positive for dysuria and frequency. Negative for flank pain.   BP 100/60 (BP Location: Left Arm, Patient Position: Sitting, Cuff Size: Normal)    Pulse 89    Temp 97.9 F (36.6 C) (Oral)    Ht 5\' 2"  (1.575 m)    Wt 139 lb 6.4 oz (63.2 kg)    SpO2 98%    BMI 25.50 kg/m   Weight: 139 lb 6.4 oz (63.2 kg)   Physical Exam Constitutional:      Appearance: She is well-developed.  Cardiovascular:     Rate and Rhythm: Normal rate and regular rhythm.     Heart sounds: Normal heart sounds.  Pulmonary:     Effort: Pulmonary effort is normal. No respiratory distress.     Breath sounds: Decreased breath sounds (diffuse) present. No rales.  Abdominal:     General: Bowel sounds are normal. There is no distension.     Palpations: Abdomen is soft. There is no mass.     Tenderness: There is abdominal tenderness (suprapubic). There is no guarding or rebound.     Assessment/Plan: 1. Dysuria Sx are classic; although UA looks ok. We will treat assuming infection and send urine for culture. She has done well with omnicef in the past for resp infection; and since recently completing zpack and with ongoing sx; this should give some resp and urinary coverage.  - POC Urinalysis Dipstick  2. COPD exacerbation ([place]) Follow up with pulmonology if any worsening of symptoms. She feels she needs extended prednisone so this was refilled today. She monitors sx veryclosely and uses prednisone sparingly when able.   3. Acute cystitis without hematuria See above.  - cefdinir (OMNICEF) 300 MG capsule; Take 1 capsule (300 mg total) by mouth 2 (two) times daily for 5 days.  Dispense: 10 capsule; Refill: 0 - Urine Culture; Future - Urine Culture   Take antibiotic as directed. We will call you with urine culture results when we get them. If you have any worsening of symptoms call us. Pyridium can be used to help with urinary discomfort (this will turn your urine bright orange).     Return if symptoms worsen or fail to improve.  Micheline Rough, MD

## 2021-05-01 ENCOUNTER — Encounter: Payer: Self-pay | Admitting: Adult Health

## 2021-05-01 ENCOUNTER — Other Ambulatory Visit: Payer: Self-pay | Admitting: Adult Health

## 2021-05-01 ENCOUNTER — Telehealth: Payer: Self-pay | Admitting: Adult Health

## 2021-05-01 LAB — URINE CULTURE
MICRO NUMBER:: 12905649
SPECIMEN QUALITY:: ADEQUATE

## 2021-05-01 MED ORDER — FOSFOMYCIN TROMETHAMINE 3 G PO PACK: 3.0000 g | PACK | Freq: Once | ORAL | 0 refills | Status: AC

## 2021-05-01 NOTE — Telephone Encounter (Signed)
Patient is requesting a call back from [facility] regarding a medication reaction. She thinks she may be having a reaction to the antibiotic that Dr. Ethlyn Gallery prescribed her earlier this week for her UTI. She says that she has taken two benadryl to help with the itchiness but her face is red and broken out. Offered to look for an appointment for patient but patient says she would just like [facility] to call her.  Patient can be reached at [phone].  Please advise.

## 2021-05-01 NOTE — Telephone Encounter (Signed)
Patient with allergic reaction to [facility]. Reports rash and welts on face. She stopped the abx after a dose.   Will send in Fosfomycin.   Has an appointment tomorrow that I would still like her to go to

## 2021-05-01 NOTE — Telephone Encounter (Signed)
Patient called in following up on prior phone call. Patient is still requesting a phone call back from [facility] but she did schedule an appointment to see [facility] tomorrow 01/26 at 2:30pm.  Please advise.

## 2021-05-01 NOTE — Telephone Encounter (Signed)
Please see below.

## 2021-05-02 ENCOUNTER — Ambulatory Visit (INDEPENDENT_AMBULATORY_CARE_PROVIDER_SITE_OTHER): Payer: Medicare Other | Admitting: Adult Health

## 2021-05-02 VITALS — BP 128/60 | HR 105 | Temp 98.3°F | Wt 138.2 lb

## 2021-05-02 DIAGNOSIS — T7840XA Allergy, unspecified, initial encounter: Secondary | ICD-10-CM | POA: Diagnosis not present

## 2021-05-02 DIAGNOSIS — N3000 Acute cystitis without hematuria: Secondary | ICD-10-CM | POA: Diagnosis not present

## 2021-05-02 MED ORDER — FOSFOMYCIN TROMETHAMINE 3 G PO PACK: 3.0000 g | PACK | Freq: Once | ORAL | 0 refills | Status: AC

## 2021-05-02 NOTE — Progress Notes (Signed)
Subjective:    Patient ID: Kim [name], female    DOB: [birth year], 66 y.o.   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  She presents to the office today for for concern of an allergic reaction and UTI.  She was seen on 04/29/2021 for UTI-like symptoms.  POC urinalysis showed possible UTI, urine culture confirmed.  She was started on Omnicef, took 2 days of medication and then follow-up with an allergic reaction of redness and welts to her face and questionable throat swelling.  He stopped antibiotics and her symptoms improved.  Continues to have UTI-like symptoms  Does have a questionable allergic reaction to Cipro as well as penicillin.   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   OSTEOARTHRITIS 06/05/2009   PARESTHESIA 08/24/2007   TOBACCO ABUSE 01/25/2010    Social History   Socioeconomic History   Marital status: Married    Spouse name: [name]   Number of children: 2   Years of education: Not on file   Highest education level: Not on file  Occupational History   Occupation: Patient care aide-sits with elderly lady  Tobacco Use   Smoking status: Former    Packs/day: 2.00    Years: 38.00    Pack years: 76.00    Types: Cigarettes    Quit date: 03/07/2014    Years since quitting: 7.1   Smokeless tobacco: Never  Vaping Use   Vaping Use: Never used  Substance and Sexual Activity   Alcohol use: No    Alcohol/week: 0.0 standard drinks   Drug use: No   Sexual activity: Not on file  Other Topics Concern   Not on file  Social  History Narrative   [facility] (01/12/17):   Originally from [facility]. Has lived in [place] for 17 years. Married. Has 2 dogs currently. No mold and only 1 indoor plant. Had her home professionally cleaned a month ago. No bird or hot tub exposure. 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    Right handed    Social Determinants of Health   Financial Resource Strain: Low Risk    Difficulty of Paying Living Expenses: Not hard at all  Food Insecurity: No Food Insecurity   Worried About Charity fundraiser in the Last Year: Never true   Ran Out of Food in the Last Year: Never true  Transportation Needs: No Transportation Needs   Lack of Transportation (Medical): No   Lack of Transportation (Non-Medical): No  Physical Activity: Inactive   Days of Exercise per Week: 0 days   Minutes of Exercise per Session: 0 min  Stress: No Stress Concern Present   Feeling of Stress : Not at all  Social Connections: Moderately Isolated   Frequency of Communication with Friends and Family: More than three times a week   Frequency of Social Gatherings with Friends and Family: More than three times a week   Attends Religious Services: Never   Marine scientist or Organizations: No   Attends Archivist Meetings: Never   Marital Status: Married  Human resources officer Violence:  Not At Risk   Fear of Current or Ex-Partner: No   Emotionally Abused: No   Physically Abused: No   Sexually Abused: No    Past Surgical History:  Procedure Laterality Date   ABDOMINAL HYSTERECTOMY     APPENDECTOMY  1969   CARDIAC CATHETERIZATION N/A 07/05/2015   Procedure: Left Heart Cath and Coronary Angiography;  Surgeon: Leonie Man, MD;  Location: [facility];  Service: Cardiovascular;  Laterality: N/A;   CHOLECYSTECTOMY  1989   collapse lung  1990   COLONOSCOPY WITH PROPOFOL N/A 05/14/2018   Procedure: COLONOSCOPY WITH PROPOFOL;  Surgeon: Irene Shipper, MD;  Location: [facility];   Service: Endoscopy;  Laterality: N/A;   EXCISION MASS ABDOMINAL  2019   EXPLORATORY LAPAROTOMY  1969   LEFT HEART CATH AND CORONARY ANGIOGRAPHY N/A 11/19/2020   Procedure: LEFT HEART CATH AND CORONARY ANGIOGRAPHY;  Surgeon: [name], MD;  Location: [facility];  Service: Cardiovascular;  Laterality: N/A;   POLYPECTOMY  05/14/2018   Procedure: POLYPECTOMY;  Surgeon: Irene Shipper, MD;  Location: [facility];  Service: Endoscopy;;   SALPINGOOPHORECTOMY  2019    Family History  Problem Relation Age of Onset   Cancer Mother        lung   Heart attack Mother    Hypertension Sister    Multiple sclerosis Brother    Diabetes Sister    Rheum arthritis Sister    Thyroid disease Sister    Multiple sclerosis Other    Alcohol abuse Other    Arthritis Other    Diabetes Other    Kidney disease Other    Cancer Other        lung,ovarian,skin, uterine   Stroke Other    Heart disease Other    Melanoma Other    Osteoporosis Other    Heart attack Maternal Uncle    Heart attack Other        [place]   Heart attack Other        NEPHEW   Hypertension Brother    Stroke Maternal Grandmother    Colon cancer Neg Hx     Allergies  Allergen Reactions   Tape Other (See Comments)    SKIN TEARS EASILY!!!!   Ciprofloxacin Other (See Comments)    Hallucinations    Lisinopril Cough   Penicillins Hives and Other (See Comments)    Tolerates Omnicef Has patient had a PCN reaction causing immediate rash, facial/tongue/throat swelling, SOB or lightheadedness with hypotension: Yes Has patient had a PCN reaction causing severe rash involving mucus membranes or skin necrosis: No Has patient had a PCN reaction that required hospitalization No Has patient had a PCN reaction occurring within the last 10 years: No If all of the above answers are "NO", then may proceed with Cephalosporin use.    Omnicef [Cefdinir] Rash    Current Outpatient Medications on File Prior to Visit  Medication Sig  Dispense Refill   albuterol (VENTOLIN HFA) 108 (90 Base) MCG/ACT inhaler Take two puffs every 4-6 hours as needed for shortness of breath/wheezing . 90 day supply 162 g 1   ALPRAZolam (XANAX) 0.5 MG tablet TAKE 1/2 TO 1 TABLET BY MOUTH THREE TIMES DAILY 60 tablet 2   aspirin EC 81 MG EC tablet Take 1 tablet (81 mg total) by mouth daily.     cefdinir (OMNICEF) 300 MG capsule Take 1 capsule (300 mg total) by mouth 2 (two) times daily for 5 days. 10 capsule 0  cetirizine (ZYRTEC) 10 MG tablet Take 10 mg by mouth daily.     furosemide (LASIX) 40 MG tablet Take 1 tablet (40 mg total) by mouth daily as needed (for an overnight weight gain of 3-5 pounds of fluid).     HYDROcodone-acetaminophen (NORCO) 7.5-325 MG tablet Take 1 tablet by mouth every 6 (six) hours as needed for moderate pain. 45 tablet 0   ipratropium-albuterol (DUONEB) 0.5-2.5 (3) MG/3ML SOLN USE 3 ML VIA NEBULIZER EVERY 6 HOURS AS NEEDED.  DX: J44.9 360 mL 5   losartan (COZAAR) 25 MG tablet Take 1 tablet (25 mg total) by mouth daily. 90 tablet 3   magnesium oxide (MAG-OX) 400 MG tablet Take 400 mg by mouth daily.     metoprolol succinate (TOPROL-XL) 25 MG 24 hr tablet Take 0.5 tablets (12.5 mg total) by mouth daily. 45 tablet 3   MUCINEX MAXIMUM STRENGTH 1200 MG TB12 Take 1,200 mg by mouth every 12 (twelve) hours.     nitroGLYCERIN (NITROSTAT) 0.4 MG SL tablet Place 1 tablet (0.4 mg total) under the tongue every 5 (five) minutes as needed for chest pain. 25 tablet 3   nystatin (MYCOSTATIN) 100000 UNIT/ML suspension Take 5 mLs (500,000 Units total) by mouth 4 (four) times daily as needed (for thrush). 60 mL 1   OXYGEN Inhale 2 L/min into the lungs continuous.     PARoxetine (PAXIL) 30 MG tablet TAKE 1 TABLET EVERY DAY (Patient taking differently: Take 30 mg by mouth in the morning.) 90 tablet 1   predniSONE (DELTASONE) 10 MG tablet Take 4 tabs daily x 4 days, then 3 tabs daily x 4 days, then 2 tabs daily x 4 days, then 1 tab daily x4 days  then stop. #60 60 tablet 0   Spacer/Aero Chamber Mouthpiece MISC 1 Device by Does not apply route as directed. 1 each 0   SYMBICORT 160-4.5 MCG/ACT inhaler INHALE 2 PUFFS TWICE DAILY 30 g 3   Tiotropium Bromide Monohydrate (SPIRIVA RESPIMAT) 2.5 MCG/ACT AERS Inhale 2 puffs into the lungs daily. 4 g 5   Current Facility-Administered Medications on File Prior to Visit  Medication Dose Route Frequency Provider Last Rate Last Admin   0.9 %  sodium chloride infusion   Intravenous PRN Pickenpack-Cousar, Athena N, NP        BP 128/60 (BP Location: Left Arm, Patient Position: Sitting, Cuff Size: Normal)    Pulse (!) 105    Temp 98.3 F (36.8 C) (Oral)    Wt 138 lb 3.2 oz (62.7 kg)    SpO2 98%    BMI 25.28 kg/m       Objective:   Physical Exam Vitals and nursing note reviewed.  Constitutional:      Appearance: Normal appearance.  Cardiovascular:     Rate and Rhythm: Normal rate and regular rhythm.     Pulses: Normal pulses.     Heart sounds: Normal heart sounds.  Pulmonary:     Effort: Pulmonary effort is normal.     Breath sounds: Normal breath sounds.  Neurological:     General: No focal deficit present.     Mental Status: She is alert and oriented to person, place, and time.  Psychiatric:        Mood and Affect: Mood normal.        Behavior: Behavior normal.        Thought Content: Thought content normal.        Judgment: Judgment normal.      Assessment &  Plan:  1. Acute cystitis without hematuria  - fosfomycin (MONUROL) 3 g PACK; Take 3 g by mouth once for 1 dose.  Dispense: 3 g; Refill: 0  2. Allergic reaction, initial encounter - Resolved   [name], NP

## 2021-05-08 ENCOUNTER — Ambulatory Visit
Admission: RE | Admit: 2021-05-08 | Discharge: 2021-05-08 | Disposition: A | Discharge status: Home / Self Care | Payer: Medicare Other | Source: Ambulatory Visit | Attending: Acute Care | Admitting: Acute Care

## 2021-05-08 DIAGNOSIS — Z87891 Personal history of nicotine dependence: Secondary | ICD-10-CM

## 2021-05-09 ENCOUNTER — Other Ambulatory Visit: Payer: Self-pay

## 2021-05-09 DIAGNOSIS — Z87891 Personal history of nicotine dependence: Secondary | ICD-10-CM

## 2021-05-12 ENCOUNTER — Other Ambulatory Visit: Payer: Self-pay

## 2021-05-12 ENCOUNTER — Ambulatory Visit
Admission: RE | Admit: 2021-05-12 | Discharge: 2021-05-12 | Disposition: A | Discharge status: Home / Self Care | Payer: Medicare Other | Source: Ambulatory Visit | Attending: Neurology | Admitting: Neurology

## 2021-05-12 DIAGNOSIS — R42 Dizziness and giddiness: Secondary | ICD-10-CM

## 2021-05-12 DIAGNOSIS — R55 Syncope and collapse: Secondary | ICD-10-CM

## 2021-05-12 MED ORDER — GADOBENATE DIMEGLUMINE 529 MG/ML IV SOLN
12.0000 mL | Freq: Once | INTRAVENOUS | Status: AC | PRN
  Administered 2021-05-12: 12 mL via INTRAVENOUS

## 2021-05-13 ENCOUNTER — Ambulatory Visit (INDEPENDENT_AMBULATORY_CARE_PROVIDER_SITE_OTHER): Payer: Medicare Other | Admitting: Neurology

## 2021-05-13 ENCOUNTER — Telehealth: Payer: Self-pay

## 2021-05-13 DIAGNOSIS — R55 Syncope and collapse: Secondary | ICD-10-CM | POA: Diagnosis not present

## 2021-05-13 DIAGNOSIS — R42 Dizziness and giddiness: Secondary | ICD-10-CM | POA: Diagnosis not present

## 2021-05-13 NOTE — Telephone Encounter (Signed)
Pt called an informed  the brain MRI looked good, no tumor, stroke, or bleed. It showed some hardening of the small blood vessels seen in patients with blood pressure and cholesterol issues, would not cause her symptoms. Her EEG was normal, proceed with prolonged EEG as scheduled

## 2021-05-13 NOTE — Telephone Encounter (Signed)
-----   Message from Cameron Sprang, MD sent at 05/13/2021  2:31 PM EST ----- Pls let her know the brain MRI looked good, no tumor, stroke, or bleed. It showed some hardening of the small blood vessels seen in patients with blood pressure and cholesterol issues, would not cause her symptoms. Her EEG was normal, proceed with prolonged EEG as scheduled, thanks

## 2021-05-13 NOTE — Procedures (Signed)
ELECTROENCEPHALOGRAM REPORT  Date of Study: 05/13/2021  Patient's Name: Kim [name] MRN: [id number] Date of Birth: 11-09-1955  Referring Provider: Dr. Ellouise Newer  Clinical History: This is a 66 year old woman with an episode of loss of consciousness in 03/2021, recurrent episodes of dizziness, some with associated blank stare but responsive. EEG for classification.  Medications: VENTOLIN HFA 108 (90 Base) MCG/ACT inhaler XANAX 0.5 MG tablet aspirin EC 81 MG EC tablet ZYRTEC 10 MG tablet LASIX 40 MG tablet NORCO 7.5-325 MG tablet DUONEB 0.5-2.5 (3) MG/3ML SOLN COZAAR 50 MG tablet MAG-OX 400 MG tablet TOPROL-XL 25 MG 24 hr tablet MUCINEX MAXIMUM STRENGTH 1200 MG TB12 NITROSTAT 0.4 MG SL tablet MYCOSTATIN 100000 UNIT/ML suspension PAXIL 30 MG tablet DELTASONE 10 MG tablet SYMBICORT 160-4.5 MCG/ACT inhaler SPIRIVA RESPIMAT 2.5 MCG/ACT AERS  Technical Summary: A multichannel digital 1-hour EEG recording measured by the international 10-20 system with electrodes applied with paste and impedances below 5000 ohms performed in our laboratory with EKG monitoring in an awake and asleep patient.  Hyperventilation was not performed. Photic stimulation was performed.  The digital EEG was referentially recorded, reformatted, and digitally filtered in a variety of bipolar and referential montages for optimal display.    Description: The patient is awake and asleep during the recording.  During maximal wakefulness, there is a symmetric, medium voltage 10 Hz posterior dominant rhythm that attenuates with eye opening.  The record is symmetric.  During drowsiness and sleep, there is an increase in theta slowing of the background.  Vertex waves and symmetric sleep spindles were seen.  Photic stimulation did not elicit any abnormalities.  There were no epileptiform discharges or electrographic seizures seen.    EKG lead was unremarkable.  Impression: This 1-hour awake and asleep EEG is normal.     Clinical Correlation: A normal EEG does not exclude a clinical diagnosis of epilepsy.  If further clinical questions remain, prolonged EEG may be helpful.  Clinical correlation is advised.   Ellouise Newer, M.D.

## 2021-05-15 ENCOUNTER — Telehealth: Payer: Self-pay | Admitting: *Deleted

## 2021-05-15 NOTE — Telephone Encounter (Signed)
° °  Pre-operative Risk Assessment    Patient Name: Kim [name]  DOB: 04-12-1955 MRN: [id number]      Request for Surgical Clearance    Procedure:   CATARACT EXTRACTION BY PE, IOL-LEFT EYE; THIS WILL THEN BE FOLLOWED BY THE RIGHT EYE  Date of Surgery:  Clearance TBD                                 Surgeon:  DR. Adela Ports Surgeon's Group or Practice Name:  [facility] Phone number:  [phone] Fax number:  [phone]   Type of Clearance Requested:   - Medical  PER [facility], NO MEDICATIONS LISTED TO BE HELD   Type of Anesthesia:   IV SEDATION    Additional requests/questions:    Jiles Prows   05/15/2021, 5:05 PM

## 2021-05-16 NOTE — Telephone Encounter (Signed)
° °  Patient Name: Kim [name]  DOB: [birth year] MRN: [id number]  Primary Cardiologist: Lauree Chandler, MD  Chart reviewed as part of pre-operative protocol coverage. Cataract extractions are recognized in guidelines as low risk surgeries that do not typically require specific preoperative testing or holding of blood thinner therapy. Therefore, given past medical history and time since last visit, based on ACC/AHA guidelines, RAIZEL WESOLOWSKI would be at acceptable risk for the planned procedure without further cardiovascular testing.   I will route this recommendation to the requesting party via Epic fax function and remove from pre-op pool.  Please call with questions.  [place], [place] 05/16/2021, 10:21 AM

## 2021-05-20 ENCOUNTER — Telehealth: Payer: Self-pay | Admitting: Pulmonary Disease

## 2021-05-20 NOTE — Telephone Encounter (Signed)
Fax received from Dr. Tama High with [facility] to perform a Cataract Extraction by PE, IOL- Left on patient.  Patient needs surgery clearance. Patient was seen on 04/10/2021 but Beth. Office protocol is a risk assessment can be sent to surgeon if patient has been seen in 60 days or less.   Sending to [facility] for risk assessment or recommendations if patient needs to be seen in office prior to surgical procedure.

## 2021-05-21 ENCOUNTER — Encounter: Payer: Self-pay | Admitting: Adult Health

## 2021-05-21 ENCOUNTER — Ambulatory Visit (INDEPENDENT_AMBULATORY_CARE_PROVIDER_SITE_OTHER): Payer: Medicare Other | Admitting: Adult Health

## 2021-05-21 VITALS — BP 122/72 | HR 98 | Temp 98.3°F | Ht 62.0 in | Wt 139.0 lb

## 2021-05-21 DIAGNOSIS — N3000 Acute cystitis without hematuria: Secondary | ICD-10-CM | POA: Diagnosis not present

## 2021-05-21 MED ORDER — CIPROFLOXACIN HCL 500 MG PO TABS: 500.0000 mg | ORAL_TABLET | Freq: Two times a day (BID) | ORAL | 0 refills | Status: AC

## 2021-05-21 NOTE — Progress Notes (Signed)
Subjective:    Patient ID: Kim [name], female    DOB: 1955/12/19, 66 y.o.   MRN: [id number]  HPI 66 year old female who  has a past medical history of C8 RADICULOPATHY (06/05/2009), COPD (08/31/2007), DEPRESSION (11/19/2006), DYSLIPIDEMIA (03/12/2009), MI (mitral incompetence), NEPHROLITHIASIS (01/05/2008), OSTEOARTHRITIS (06/05/2009), PARESTHESIA (08/24/2007), and TOBACCO ABUSE (01/25/2010).  She presents to the office today for concern of a recurrent UTI. She was last treated for a UTI on 04/29/2020 with [facility] but had a allergic reaction of redness and welts to her face and questionable throat swelling. She was then switched to Fosfomycin on 05/02/2021 but reports that her symptoms never improved.   She has a questionable allergy to cipro ( hallucinations) - but was also on a lot of other medications when she was taking Cipro   She continues to have dysuria, frequency, urgency and odorous urine. Denies fevers or chills.    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   OSTEOARTHRITIS 06/05/2009   PARESTHESIA 08/24/2007   TOBACCO ABUSE 01/25/2010    Social History   Socioeconomic History   Marital status: Married    Spouse name: [name]   Number of children: 2   Years of education: Not on file   Highest education level: Not on file  Occupational History   Occupation: Patient care aide-sits with elderly lady  Tobacco Use   Smoking status: Former    Packs/day: 2.00    Years: 38.00    Pack years: 76.00    Types: Cigarettes    Quit date: 03/07/2014    Years since quitting: 7.2   Smokeless tobacco: Never  Vaping Use   Vaping Use: Never used  Substance and Sexual Activity   Alcohol use: No    Alcohol/week: 0.0 standard drinks   Drug use: No   Sexual activity: Not on file  Other Topics Concern   Not on  file  Social History Narrative   [facility] (01/12/17):   Originally from [facility] [facility]. Has lived in [place] for 17 years. Married. Has 2 dogs currently. No mold and only 1 indoor plant. Had her home professionally cleaned a month ago. No bird or hot tub exposure. 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    Right handed    Social Determinants of Health   Financial Resource Strain: Low Risk    Difficulty of Paying Living Expenses: Not hard at all  Food Insecurity: No Food Insecurity   Worried About Charity fundraiser in the Last Year: Never true   Ran Out of Food in the Last Year: Never true  Transportation Needs: No Transportation Needs   Lack of Transportation (Medical): No   Lack of Transportation (Non-Medical): No  Physical Activity: Inactive   Days of Exercise per Week: 0 days   Minutes of Exercise per Session: 0 min  Stress: No Stress Concern Present   Feeling of Stress : Not at all  Social Connections: Moderately Isolated   Frequency of Communication with Friends and Family: More than three times a week   Frequency of Social Gatherings with Friends and Family: More than three times a week   Attends Religious Services: Never   Marine scientist or Organizations: No   Attends Archivist Meetings: Never  Marital Status: Married  Human resources officer Violence: Not At Risk   Fear of Current or Ex-Partner: No   Emotionally Abused: No   Physically Abused: No   Sexually Abused: No    Past Surgical History:  Procedure Laterality Date   ABDOMINAL HYSTERECTOMY     APPENDECTOMY  1969   CARDIAC CATHETERIZATION N/A 07/05/2015   Procedure: Left Heart Cath and Coronary Angiography;  Surgeon: Leonie Man, MD;  Location: [facility];  Service: Cardiovascular;  Laterality: N/A;   CHOLECYSTECTOMY  1989   collapse lung  1990   COLONOSCOPY WITH PROPOFOL N/A 05/14/2018   Procedure: COLONOSCOPY WITH PROPOFOL;  Surgeon: Irene Shipper, MD;  Location:  [facility];  Service: Endoscopy;  Laterality: N/A;   EXCISION MASS ABDOMINAL  2019   EXPLORATORY LAPAROTOMY  1969   LEFT HEART CATH AND CORONARY ANGIOGRAPHY N/A 11/19/2020   Procedure: LEFT HEART CATH AND CORONARY ANGIOGRAPHY;  Surgeon: [name], MD;  Location: [facility];  Service: Cardiovascular;  Laterality: N/A;   POLYPECTOMY  05/14/2018   Procedure: POLYPECTOMY;  Surgeon: Irene Shipper, MD;  [facility];  Service: Endoscopy;;   SALPINGOOPHORECTOMY  2019    Family History  Problem Relation Age of Onset   Cancer Mother        lung   Heart attack Mother    Hypertension Sister    Multiple sclerosis Brother    Diabetes Sister    Rheum arthritis Sister    Thyroid disease Sister    Multiple sclerosis Other    Alcohol abuse Other    Arthritis Other    Diabetes Other    Kidney disease Other    Cancer Other        lung,ovarian,skin, uterine   Stroke Other    Heart disease Other    Melanoma Other    Osteoporosis Other    Heart attack Maternal Uncle    Heart attack Other        [place]   Heart attack Other        NEPHEW   Hypertension Brother    Stroke Maternal Grandmother    Colon cancer Neg Hx     Allergies  Allergen Reactions   Tape Other (See Comments)    SKIN TEARS EASILY!!!!   Lisinopril Cough   Morphine And Related     hallucinations   Penicillins Hives and Other (See Comments)    Tolerates Omnicef Has patient had a PCN reaction causing immediate rash, facial/tongue/throat swelling, SOB or lightheadedness with hypotension: Yes Has patient had a PCN reaction causing severe rash involving mucus membranes or skin necrosis: No Has patient had a PCN reaction that required hospitalization No Has patient had a PCN reaction occurring within the last 10 years: No If all of the above answers are "NO", then may proceed with Cephalosporin use.    Omnicef [Cefdinir] Rash    Current Outpatient Medications on File Prior to Visit  Medication Sig  Dispense Refill   albuterol (VENTOLIN HFA) 108 (90 Base) MCG/ACT inhaler Take two puffs every 4-6 hours as needed for shortness of breath/wheezing . 90 day supply 162 g 1   ALPRAZolam (XANAX) 0.5 MG tablet TAKE 1/2 TO 1 TABLET BY MOUTH THREE TIMES DAILY 60 tablet 2   aspirin EC 81 MG EC tablet Take 1 tablet (81 mg total) by mouth daily.     cetirizine (ZYRTEC) 10 MG tablet Take 10 mg by mouth daily.     furosemide (LASIX)  40 MG tablet Take 1 tablet (40 mg total) by mouth daily as needed (for an overnight weight gain of 3-5 pounds of fluid).     HYDROcodone-acetaminophen (NORCO) 7.5-325 MG tablet Take 1 tablet by mouth every 6 (six) hours as needed for moderate pain. 45 tablet 0   ipratropium-albuterol (DUONEB) 0.5-2.5 (3) MG/3ML SOLN USE 3 ML VIA NEBULIZER EVERY 6 HOURS AS NEEDED.  DX: J44.9 360 mL 5   losartan (COZAAR) 25 MG tablet Take 1 tablet (25 mg total) by mouth daily. 90 tablet 3   magnesium oxide (MAG-OX) 400 MG tablet Take 400 mg by mouth daily.     metoprolol succinate (TOPROL-XL) 25 MG 24 hr tablet Take 0.5 tablets (12.5 mg total) by mouth daily. 45 tablet 3   MUCINEX MAXIMUM STRENGTH 1200 MG TB12 Take 1,200 mg by mouth every 12 (twelve) hours.     nitroGLYCERIN (NITROSTAT) 0.4 MG SL tablet Place 1 tablet (0.4 mg total) under the tongue every 5 (five) minutes as needed for chest pain. 25 tablet 3   nystatin (MYCOSTATIN) 100000 UNIT/ML suspension Take 5 mLs (500,000 Units total) by mouth 4 (four) times daily as needed (for thrush). 60 mL 1   OXYGEN Inhale 2 L/min into the lungs continuous.     PARoxetine (PAXIL) 30 MG tablet TAKE 1 TABLET EVERY DAY (Patient taking differently: Take 30 mg by mouth in the morning.) 90 tablet 1   predniSONE (DELTASONE) 10 MG tablet Take 4 tabs daily x 4 days, then 3 tabs daily x 4 days, then 2 tabs daily x 4 days, then 1 tab daily x4 days then stop. #60 60 tablet 0   Spacer/Aero Chamber Mouthpiece MISC 1 Device by Does not apply route as directed. 1 each 0    SYMBICORT 160-4.5 MCG/ACT inhaler INHALE 2 PUFFS TWICE DAILY 30 g 3   Tiotropium Bromide Monohydrate (SPIRIVA RESPIMAT) 2.5 MCG/ACT AERS Inhale 2 puffs into the lungs daily. 4 g 5   Current Facility-Administered Medications on File Prior to Visit  Medication Dose Route Frequency Provider Last Rate Last Admin   0.9 %  sodium chloride infusion   Intravenous PRN Pickenpack-Cousar, Athena N, NP        BP 122/72    Pulse 98    Temp 98.3 F (36.8 C) (Oral)    Ht 5\' 2"  (1.575 m)    Wt 139 lb (63 kg)    SpO2 96%    BMI 25.42 kg/m       Objective:   Physical Exam Vitals and nursing note reviewed.  Constitutional:      Appearance: Normal appearance.  Abdominal:     Tenderness: There is no abdominal tenderness. There is no right CVA tenderness or left CVA tenderness.  Skin:    General: Skin is warm and dry.  Neurological:     General: No focal deficit present.     Mental Status: She is alert and oriented to person, place, and time.       Assessment & Plan:  1. Acute cystitis without hematuria - Will treat with Cipro x 3 days  - ciprofloxacin (CIPRO) 500 MG tablet; Take 1 tablet (500 mg total) by mouth 2 (two) times daily for 3 days.  Dispense: 6 tablet; Refill: 0 - Follow up if no improvement   [name], NP

## 2021-05-21 NOTE — Telephone Encounter (Signed)
She has a visit tomorrow with Dr. Elsworth Soho, recommend he do risk assessment at visit if he will be seeing her before surgery

## 2021-05-22 ENCOUNTER — Other Ambulatory Visit: Payer: Self-pay

## 2021-05-22 ENCOUNTER — Encounter: Payer: Self-pay | Admitting: Pulmonary Disease

## 2021-05-22 ENCOUNTER — Ambulatory Visit (INDEPENDENT_AMBULATORY_CARE_PROVIDER_SITE_OTHER): Payer: Medicare Other | Admitting: Pulmonary Disease

## 2021-05-22 VITALS — BP 122/66 | HR 64 | Temp 97.7°F | Ht 62.0 in | Wt 140.6 lb

## 2021-05-22 DIAGNOSIS — J9611 Chronic respiratory failure with hypoxia: Secondary | ICD-10-CM | POA: Diagnosis not present

## 2021-05-22 DIAGNOSIS — J449 Chronic obstructive pulmonary disease, unspecified: Secondary | ICD-10-CM | POA: Diagnosis not present

## 2021-05-22 DIAGNOSIS — J441 Chronic obstructive pulmonary disease with (acute) exacerbation: Secondary | ICD-10-CM

## 2021-05-22 MED ORDER — PREDNISONE 10 MG PO TABS: 10.0000 mg | ORAL_TABLET | Freq: Every day | ORAL | 1 refills | Status: DC

## 2021-05-22 NOTE — Addendum Note (Signed)
Addended by: Fran Lowes on: 05/22/2021 10:36 AM   Modules accepted: Orders

## 2021-05-22 NOTE — Assessment & Plan Note (Signed)
Continue Symbicort and Spiriva.  She did not tolerate Daliresp in the past. She has repeated exacerbations, concern here is vertebral fractures due to long-term steroid use.   unfortunately her breathing is very bad and there is no other alternative , she seems to require repeated high-dose steroid tapers every month. We will keep her on low-dose steroids for longer.  This time, start her on 40 mg and rapidly taper within a week to a baseline dose of 10 mg and leave her at this dose for 1 month. If she does well then we will drop her to 5 mg for at least a total of 3 months  We again discussed long-term effects of steroids

## 2021-05-22 NOTE — Patient Instructions (Addendum)
X Rx to [facility] for 2L 24/7 Ambulatory sat  X Prednisone 10 mg tabs Take 4 tabs  daily with food x 2 days, then 3 tabs daily x 2 days, then 2 tabs daily x 2 days, then 1 tab daily  # 60 x 1 refill  X cleared for cataract surgery

## 2021-05-22 NOTE — Assessment & Plan Note (Signed)
She qualifies for portable oxygen on ambulation.  We will send in a prescription for POC to [facility] directly -Previous ABG does not show any need for NIV

## 2021-05-22 NOTE — Progress Notes (Signed)
Subjective:    Patient ID: Kim [name], female    DOB: [birth year], 66 y.o.   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  Meds-stopped Daliresp due to skin peeling 01/2021     COPD exacerbations: June 2020- COPD exacerbation/Prednisone + zpack September- prednisone PCP December 2020- Prednisone + zpack ? PCP January 18th 2021- COPD exacerbation/Prednisone + doxycycline  Feb 5th 2021- COPD exacerbation/Prednisone + Zpack  Feb 9th 2021- Prolonged COPD exacerbation requiring high dose prednisone/ CXR ordered  April 4th- Hospitalized for COPD exacerbation treated with IV steriods and oral prednisone taper 08/12/2019 - Zpack and extrended prednisone taper     09/2019>> low-dose steroids - tapered to off by end August.    01/2020 OV >> pred 40 mg, settling at 10 03/2020 hospitalized for 10 days for COPD exacerbation 09/2020 developed COVID infection, received monoclonal antibody She was admitted from 11/2020 for COPD with acute exacerbation and stress-induced cardiomyopathy/acute systolic heart failure/type II myocardial infarction secondary to cardiomyopathy  Last OV 02/2021 - treated for COPD flare, neighbor burning >> exposure to fumes  04/2021 video visit >> pred taper  Chief Complaint  Patient presents with   Follow-up    Follow up. Patient says she has had coughing and wheezing for the past 2 days.    She requires surgical clearance for cataract surgery Dr. Tama High with [facility] to perform a Cataract Extraction by PE, IOL- Left on patient.   She reports cough and wheezing for the last 2 days, no clear trigger.  She has not received [facility] POC 8.  She has her own POC which she obtained more than 5 years ago and an oxygen concentrator that she obtained 10 years ago.  She reports syncope and a fall,  was noted to be orthostatic, EEG is pending.  Accompanied by her husband who corroborates history, they have a dog at home for many years  Significant tests/ events reviewed  09/2020 ABG 7.3 7/46/82 01/2017 alpha-1 >> 102 nml level ,PI* M1N   11/2014: FVC 2.00 L (63%) FEV1 1.00 L (41%) ratio 50  negative bronchodilator response TLC 5.06 L (103%) RV 140% DLCO uncorrected 51%   LDCT chest 05/2021 LLL nodule 4 mm, stable CT angiogram chest 09/2020 no airspace disease   CT angiogram chest 03/24/2020  Severe centrilobular emphysema with mild superimposed diffuse ground-glass opacity with an apical predominance, similar in comparison to prior study. Findings likely reflect smoking related lung disease/respiratory bronchiolitis. New mild compression fracture deformity of L1 since 10/ 2021 01/2020 LDCT - stable, T7 fracture 12/2018 LD CT chest - stable nodules   10/2016 CT chest showed subcentimeter nodules which have been stable since 2017, right adrenal nodule was stable since 2015 and considered benign  Review of Systems neg for any significant sore throat, dysphagia, itching, sneezing, nasal congestion or excess/ purulent secretions, fever, chills, sweats, unintended wt loss, pleuritic or exertional cp, hempoptysis, orthopnea pnd or change in chronic leg swelling. Also denies presyncope, palpitations, heartburn, abdominal pain, nausea, vomiting, diarrhea or change in bowel or urinary habits, dysuria,hematuria, rash, arthralgias, visual complaints, headache, numbness weakness or ataxia.     Objective:   Physical Exam  Gen. Pleasant, well-nourished, in no distress, normal affect ENT - no pallor,icterus, no post nasal drip Neck: No JVD, no thyromegaly, no carotid bruits Lungs: no use of accessory muscles,  no dullness to percussion, bilateral scattered rhonchi Cardiovascular: Rhythm regular, heart sounds  normal, no murmurs or gallops, no peripheral edema Abdomen: soft and non-tender, no  hepatosplenomegaly, BS normal. Musculoskeletal: No deformities, no cyanosis or clubbing Neuro:  alert, non focal       Assessment & Plan:   Preoperative clearance -cataract surgery is low risk and she would be cleared from a pulmonary standpoint.  If conscious sedation is used she will clearly need oxygen monitoring during the procedure.  She should be auscultated the morning of procedure to make sure that she does not have ongoing bronchospasm

## 2021-05-24 LAB — RESPIRATORY ALLERGY PROFILE REGION II ~~LOC~~
Allergen, A. alternata, m6: <0.1 kU/L
Allergen, Cedar tree, t12: <0.1 kU/L
Allergen, Comm Silver Birch, t9: <0.1 kU/L
Allergen, Cottonwood, t14: <0.1 kU/L
Allergen, D pternoyssinus,d7: <0.1 kU/L
Allergen, Mouse Urine Protein, e78: <0.1 kU/L
Allergen, Mulberry, t76: <0.1 kU/L
Allergen, Oak,t7: <0.1 kU/L
Allergen, P. notatum, m1: <0.1 kU/L
Aspergillus fumigatus, m3: <0.1 kU/L
Bermuda Grass: 0.16 kU/L — ABNORMAL HIGH
Box Elder IgE: <0.1 kU/L
CLADOSPORIUM HERBARUM (M2) IGE: <0.1 kU/L
COMMON RAGWEED (SHORT) (W1) IGE: <0.1 kU/L
Cat Dander: <0.1 kU/L
Class: 0
Class: 0
Class: 0
Class: 0
Class: 0
Class: 0
Class: 0
Class: 0
Class: 0
Class: 0
Class: 0
Class: 0
Class: 0
Class: 0
Class: 0
Class: 0
Class: 0
Class: 0
Class: 0
Class: 1
Class: 1
Class: 1
Class: 2
Cockroach: <0.1 kU/L
D. farinae: <0.1 kU/L
Dog Dander: 1.09 kU/L — ABNORMAL HIGH
Elm IgE: <0.1 kU/L
IgE (Immunoglobulin E), Serum: 146 kU/L — ABNORMAL HIGH (ref <=114)
Johnson Grass: 0.35 kU/L — ABNORMAL HIGH
Pecan/Hickory Tree IgE: 0.35 kU/L — ABNORMAL HIGH
Rough Pigweed  IgE: <0.1 kU/L
Sheep Sorrel IgE: <0.1 kU/L
Timothy Grass: 0.42 kU/L — ABNORMAL HIGH

## 2021-05-24 LAB — INTERPRETATION:

## 2021-05-24 NOTE — Telephone Encounter (Signed)
OV notes and clearance form have been faxed back to  [facility]. Nothing further needed at this time. °

## 2021-05-30 ENCOUNTER — Ambulatory Visit (INDEPENDENT_AMBULATORY_CARE_PROVIDER_SITE_OTHER): Payer: Medicare Other | Admitting: Adult Health

## 2021-05-30 ENCOUNTER — Encounter: Payer: Self-pay | Admitting: Adult Health

## 2021-05-30 VITALS — BP 128/72 | HR 96 | Temp 98.1°F | Ht 62.0 in | Wt 138.0 lb

## 2021-05-30 DIAGNOSIS — M159 Polyosteoarthritis, unspecified: Secondary | ICD-10-CM | POA: Diagnosis not present

## 2021-05-30 DIAGNOSIS — G8929 Other chronic pain: Secondary | ICD-10-CM

## 2021-05-30 NOTE — Progress Notes (Signed)
Subjective:    Patient ID: Kim [name], female    DOB: [birth year], 66 y.o.   MRN: [id number]  HPI 66 year old female who  has a past medical history of C8 RADICULOPATHY (06/05/2009), COPD (08/31/2007), DEPRESSION (11/19/2006), DYSLIPIDEMIA (03/12/2009), MI (mitral incompetence), NEPHROLITHIASIS (01/05/2008), OSTEOARTHRITIS (06/05/2009), PARESTHESIA (08/24/2007), and TOBACCO ABUSE (01/25/2010).  She presents to the office today for pain management visit.   NCCSRS reviewed in EPIC   Indication for chronic opioid: Osteoarthritis involving multiple joints Medication and dose: Norco 7.5-325 mg-every 6 hours as needed # pills per month: 45 Last UDS date: In 2022 Opioid Treatment Agreement signed: Yes Opioid Treatment Agreement last reviewed with patient: 01/09/2021 NCCSRS reviewed this encounter (include red flags): Reviewed with no red flags  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   OSTEOARTHRITIS 06/05/2009   PARESTHESIA 08/24/2007   TOBACCO ABUSE 01/25/2010    Social History   Socioeconomic History   Marital status: Married    Spouse name: [name]   Number of children: 2   Years of education: Not on file   Highest education level: Not on file  Occupational History   Occupation: Patient care aide-sits with elderly lady  Tobacco Use   Smoking status: Former    Packs/day: 2.00    Years: 38.00    Pack years: 76.00    Types: Cigarettes    Quit date: 03/07/2014    Years since quitting: 7.2   Smokeless tobacco: Never  Vaping Use   Vaping Use: Never used  Substance and Sexual Activity   Alcohol use: No    Alcohol/week: 0.0 standard drinks   Drug use: No   Sexual activity: Not on file  Other Topics Concern   Not on file  Social History Narrative   [facility] (01/12/17):   Originally from [facility].  Has lived in [place] for 17 years. Married. Has 2 dogs currently. No mold and only 1 indoor plant. Had her home professionally cleaned a month ago. No bird or hot tub exposure. 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    Right handed    Social Determinants of Health   Financial Resource Strain: Low Risk    Difficulty of Paying Living Expenses: Not hard at all  Food Insecurity: No Food Insecurity   Worried About Charity fundraiser in the Last Year: Never true   Ran Out of Food in the Last Year: Never true  Transportation Needs: No Transportation Needs   Lack of Transportation (Medical): No   Lack of Transportation (Non-Medical): No  Physical Activity: Inactive   Days of Exercise per Week: 0 days   Minutes of Exercise per Session: 0 min  Stress: No Stress Concern Present   Feeling of Stress : Not at all  Social Connections: Moderately Isolated   Frequency of Communication with Friends and Family: More than three times a week   Frequency of Social Gatherings with Friends and Family: More than three times a week   Attends Religious Services: Never   Marine scientist or Organizations: No   Attends Archivist Meetings: Never   Marital Status: Married  Human resources officer Violence: Not At Risk   Fear of Current or Ex-Partner: No   Emotionally Abused: No   Physically Abused:  No   Sexually Abused: No    Past Surgical History:  Procedure Laterality Date   ABDOMINAL HYSTERECTOMY     APPENDECTOMY  1969   CARDIAC CATHETERIZATION N/A 07/05/2015   Procedure: Left Heart Cath and Coronary Angiography;  Surgeon: Leonie Man, MD;  Location: [facility];  Service: Cardiovascular;  Laterality: N/A;   CHOLECYSTECTOMY  1989   collapse lung  1990   COLONOSCOPY WITH PROPOFOL N/A 05/14/2018   Procedure: COLONOSCOPY WITH PROPOFOL;  Surgeon: Irene Shipper, MD;  Location: [facility];  Service: Endoscopy;  Laterality: N/A;   EXCISION MASS ABDOMINAL  2019    EXPLORATORY LAPAROTOMY  1969   LEFT HEART CATH AND CORONARY ANGIOGRAPHY N/A 11/19/2020   Procedure: LEFT HEART CATH AND CORONARY ANGIOGRAPHY;  Surgeon: [name], MD;  Location: [facility];  Service: Cardiovascular;  Laterality: N/A;   POLYPECTOMY  05/14/2018   Procedure: POLYPECTOMY;  Surgeon: Irene Shipper, MD;  Location: [facility];  Service: Endoscopy;;   SALPINGOOPHORECTOMY  2019    Family History  Problem Relation Age of Onset   Cancer Mother        lung   Heart attack Mother    Hypertension Sister    Multiple sclerosis Brother    Diabetes Sister    Rheum arthritis Sister    Thyroid disease Sister    Multiple sclerosis Other    Alcohol abuse Other    Arthritis Other    Diabetes Other    Kidney disease Other    Cancer Other        lung,ovarian,skin, uterine   Stroke Other    Heart disease Other    Melanoma Other    Osteoporosis Other    Heart attack Maternal Uncle    Heart attack Other        [place]   Heart attack Other        NEPHEW   Hypertension Brother    Stroke Maternal Grandmother    Colon cancer Neg Hx     Allergies  Allergen Reactions   Tape Other (See Comments)    SKIN TEARS EASILY!!!!   Lisinopril Cough   Morphine And Related     hallucinations   Penicillins Hives and Other (See Comments)    Tolerates Omnicef Has patient had a PCN reaction causing immediate rash, facial/tongue/throat swelling, SOB or lightheadedness with hypotension: Yes Has patient had a PCN reaction causing severe rash involving mucus membranes or skin necrosis: No Has patient had a PCN reaction that required hospitalization No Has patient had a PCN reaction occurring within the last 10 years: No If all of the above answers are "NO", then may proceed with Cephalosporin use.    Omnicef [Cefdinir] Rash    Current Outpatient Medications on File Prior to Visit  Medication Sig Dispense Refill   albuterol (VENTOLIN HFA) 108 (90 Base) MCG/ACT inhaler Take two puffs  every 4-6 hours as needed for shortness of breath/wheezing . 90 day supply 162 g 1   ALPRAZolam (XANAX) 0.5 MG tablet TAKE 1/2 TO 1 TABLET BY MOUTH THREE TIMES DAILY 60 tablet 2   aspirin EC 81 MG EC tablet Take 1 tablet (81 mg total) by mouth daily.     cetirizine (ZYRTEC) 10 MG tablet Take 10 mg by mouth daily.     furosemide (LASIX) 40 MG tablet Take 1 tablet (40 mg total) by mouth daily as needed (for an overnight weight gain of 3-5 pounds of fluid).  HYDROcodone-acetaminophen (NORCO) 7.5-325 MG tablet Take 1 tablet by mouth every 6 (six) hours as needed for moderate pain. 45 tablet 0   ipratropium-albuterol (DUONEB) 0.5-2.5 (3) MG/3ML SOLN USE 3 ML VIA NEBULIZER EVERY 6 HOURS AS NEEDED.  DX: J44.9 360 mL 5   losartan (COZAAR) 25 MG tablet Take 1 tablet (25 mg total) by mouth daily. 90 tablet 3   magnesium oxide (MAG-OX) 400 MG tablet Take 400 mg by mouth daily.     metoprolol succinate (TOPROL-XL) 25 MG 24 hr tablet Take 0.5 tablets (12.5 mg total) by mouth daily. 45 tablet 3   MUCINEX MAXIMUM STRENGTH 1200 MG TB12 Take 1,200 mg by mouth every 12 (twelve) hours.     nitroGLYCERIN (NITROSTAT) 0.4 MG SL tablet Place 1 tablet (0.4 mg total) under the tongue every 5 (five) minutes as needed for chest pain. 25 tablet 3   nystatin (MYCOSTATIN) 100000 UNIT/ML suspension Take 5 mLs (500,000 Units total) by mouth 4 (four) times daily as needed (for thrush). 60 mL 1   OXYGEN Inhale 2 L/min into the lungs continuous.     PARoxetine (PAXIL) 30 MG tablet TAKE 1 TABLET EVERY DAY (Patient taking differently: Take 30 mg by mouth in the morning.) 90 tablet 1   predniSONE (DELTASONE) 10 MG tablet Take 1 tablet (10 mg total) by mouth daily with breakfast. 60 tablet 1   Spacer/Aero Chamber Mouthpiece MISC 1 Device by Does not apply route as directed. 1 each 0   SYMBICORT 160-4.5 MCG/ACT inhaler INHALE 2 PUFFS TWICE DAILY 30 g 3   Tiotropium Bromide Monohydrate (SPIRIVA RESPIMAT) 2.5 MCG/ACT AERS Inhale 2 puffs  into the lungs daily. 4 g 5   No current facility-administered medications on file prior to visit.    BP 128/72    Pulse 96    Temp 98.1 F (36.7 C) (Oral)    Ht 5\' 2"  (1.575 m)    Wt 138 lb (62.6 kg)    SpO2 100%    BMI 25.24 kg/m       Objective:   Physical Exam Vitals and nursing note reviewed.  Constitutional:      Appearance: Normal appearance.  Cardiovascular:     Rate and Rhythm: Normal rate and regular rhythm.     Pulses: Normal pulses.     Heart sounds: Normal heart sounds.  Pulmonary:     Effort: Pulmonary effort is normal.     Breath sounds: Normal breath sounds.     Comments: 2 L via [place]  Musculoskeletal:        General: Normal range of motion.  Skin:    General: Skin is warm and dry.  Neurological:     General: No focal deficit present.     Mental Status: She is alert and oriented to person, place, and time.  Psychiatric:        Mood and Affect: Mood normal.        Behavior: Behavior normal.        Thought Content: Thought content normal.        Judgment: Judgment normal.      Assessment & Plan:  1. Encounter for chronic pain management  - DRUG MONITOR, PANEL 1, W/CONF, URINE  2. Primary osteoarthritis involving multiple joints  - DRUG MONITOR, PANEL 1, W/CONF, URINE   [name], NP

## 2021-06-01 LAB — DRUG MONITOR, PANEL 1, W/CONF, URINE
Alphahydroxyalprazolam: 345 ng/mL — ABNORMAL HIGH (ref <25)
Alphahydroxymidazolam: NEGATIVE ng/mL (ref <50)
Alphahydroxytriazolam: NEGATIVE ng/mL (ref <50)
Aminoclonazepam: NEGATIVE ng/mL (ref <25)
Amphetamines: NEGATIVE ng/mL (ref <500)
Barbiturates: NEGATIVE ng/mL (ref <300)
Benzodiazepines: POSITIVE ng/mL — AB (ref <100)
Cocaine Metabolite: NEGATIVE ng/mL (ref <150)
Codeine: NEGATIVE ng/mL (ref <50)
Creatinine: 162.3 mg/dL (ref >=20.0)
Hydrocodone: 2018 ng/mL — ABNORMAL HIGH (ref <50)
Hydromorphone: 128 ng/mL — ABNORMAL HIGH (ref <50)
Hydroxyethylflurazepam: NEGATIVE ng/mL (ref <50)
Lorazepam: NEGATIVE ng/mL (ref <50)
Marijuana Metabolite: NEGATIVE ng/mL (ref <20)
Methadone Metabolite: NEGATIVE ng/mL (ref <100)
Morphine: NEGATIVE ng/mL (ref <50)
Nordiazepam: NEGATIVE ng/mL (ref <50)
Norhydrocodone: 2419 ng/mL — ABNORMAL HIGH (ref <50)
Opiates: POSITIVE ng/mL — AB (ref <100)
Oxazepam: NEGATIVE ng/mL (ref <50)
Oxidant: NEGATIVE ug/mL (ref <200)
Oxycodone: NEGATIVE ng/mL (ref <100)
Phencyclidine: NEGATIVE ng/mL (ref <25)
Temazepam: NEGATIVE ng/mL (ref <50)
pH: 5.7 (ref 4.5–9.0)

## 2021-06-01 LAB — DM TEMPLATE

## 2021-06-04 ENCOUNTER — Other Ambulatory Visit: Payer: Self-pay | Admitting: Adult Health

## 2021-06-04 DIAGNOSIS — M159 Polyosteoarthritis, unspecified: Secondary | ICD-10-CM

## 2021-06-04 MED ORDER — HYDROCODONE-ACETAMINOPHEN 7.5-325 MG PO TABS: 1.0000 | ORAL_TABLET | Freq: Four times a day (QID) | ORAL | 0 refills | Status: DC | PRN

## 2021-06-09 ENCOUNTER — Other Ambulatory Visit: Payer: Self-pay | Admitting: Adult Health

## 2021-06-09 DIAGNOSIS — F419 Anxiety disorder, unspecified: Secondary | ICD-10-CM

## 2021-06-09 DIAGNOSIS — F32A Depression, unspecified: Secondary | ICD-10-CM

## 2021-06-20 ENCOUNTER — Ambulatory Visit (INDEPENDENT_AMBULATORY_CARE_PROVIDER_SITE_OTHER): Payer: Medicare Other | Admitting: Pulmonary Disease

## 2021-06-20 ENCOUNTER — Encounter: Payer: Self-pay | Admitting: Pulmonary Disease

## 2021-06-20 ENCOUNTER — Other Ambulatory Visit: Payer: Self-pay

## 2021-06-20 DIAGNOSIS — J9612 Chronic respiratory failure with hypercapnia: Secondary | ICD-10-CM | POA: Diagnosis not present

## 2021-06-20 DIAGNOSIS — M8008XD Age-related osteoporosis with current pathological fracture, vertebra(e), subsequent encounter for fracture with routine healing: Secondary | ICD-10-CM

## 2021-06-20 DIAGNOSIS — J449 Chronic obstructive pulmonary disease, unspecified: Secondary | ICD-10-CM | POA: Diagnosis not present

## 2021-06-20 DIAGNOSIS — J9611 Chronic respiratory failure with hypoxia: Secondary | ICD-10-CM

## 2021-06-20 MED ORDER — PREDNISONE 5 MG PO TABS: 5.0000 mg | ORAL_TABLET | Freq: Every day | ORAL | 1 refills | Status: DC

## 2021-06-20 NOTE — Assessment & Plan Note (Signed)
We again discussed long-term side effects of steroids including that of osteoporosis and vertebral fractures. ?She had refused bisphosphonates in the past but she is more agreeable now. ?We will send her back to endocrine/Dr. Loanne Drilling, reviewed last endocrine consultation ?

## 2021-06-20 NOTE — Progress Notes (Signed)
? ?  Subjective:  ? ? Patient ID: Kim [name], female    DOB: 1955/08/28, 66 y.o.   MRN: [id number] ? ?HPI ? 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 ?  ?Meds-stopped Daliresp due to skin peeling 01/2021 ?  ?  ?COPD exacerbations: ?Multiple since 2020, 2 hospitalizations in 2021 ?03/2020 hospitalized for 10 days for COPD exacerbation ?09/2020 developed COVID infection, received monoclonal antibody ?11/2020 adm for COPD exacerbation and stress-induced cardiomyopathy/acute systolic heart failure/type II myocardial infarction secondary to cardiomyopathy ?  ?02/2021 - treated for COPD flare, neighbor burning >> exposure to fumes  ?04/2021 video visit >> pred taper ? ?05/2021  pred 40 mg and rapidly taper within a week to a baseline dose of 10 mg  ? ?Chief Complaint  ?Patient presents with  ? Follow-up  ?  Follow up. Pt states that the weather changing right now is making her have some issues. But over all she is doing ok.   ? ?She has done well over the last month on prednisone, this has been tapered down to 10 mg.  Weight overall has gone up from 131 to 140 pounds over the past year. ?She had cataract surgery and this was uneventful. ?She was able to talk to [facility] rep and understand the reason why she is not able to get a POC. ?Reports intermittent wheezing, compliant with Symbicort and Spiriva ?Husband accompanies and corroborates history ? ?Significant tests/ events reviewed ? ?09/2020 ABG 7.3 7/46/82 ?01/2017 alpha-1 >> 102 nml level ,PI* M1N ?  ?11/2014: FVC 2.00 L (63%) FEV1 1.00 L (41%) ratio 50  negative bronchodilator response TLC 5.06 L (103%) RV 140% DLCO uncorrected 51% ?  ?LDCT chest 05/2021 LLL nodule 4 mm, stable ?CT angiogram chest 09/2020 no airspace disease ?  ?CT angiogram chest 03/24/2020  Severe centrilobular emphysema with mild superimposed diffuse ground-glass  opacity with an apical predominance, similar in comparison to prior study. Findings likely reflect smoking related lung disease/respiratory bronchiolitis. New mild compression fracture deformity of L1 since 10/ 2021 ?01/2020 LDCT - stable, T7 fracture ?12/2018 LD CT chest - stable nodules ?  ?10/2016 CT chest showed subcentimeter nodules which have been stable since 2017, right adrenal nodule was stable since 2015 and considered benign ? ? ? ?Review of Systems ?neg for any significant sore throat, dysphagia, itching, sneezing, nasal congestion or excess/ purulent secretions, fever, chills, sweats, unintended wt loss, pleuritic or exertional cp, hempoptysis, orthopnea pnd or change in chronic leg swelling. Also denies presyncope, palpitations, heartburn, abdominal pain, nausea, vomiting, diarrhea or change in bowel or urinary habits, dysuria,hematuria, rash, arthralgias, visual complaints, headache, numbness weakness or ataxia. ? ?   ?Objective:  ? Physical Exam ? ?Gen. Pleasant, well-nourished, in no distress ?ENT - no thrush, no pallor/icterus,no post nasal drip ?Neck: No JVD, no thyromegaly, no carotid bruits ?Lungs: no use of accessory muscles, no dullness to percussion, decreased bilateral without rales or rhonchi  ?Cardiovascular: Rhythm regular, heart sounds  normal, no murmurs or gallops, no peripheral edema ?Musculoskeletal: No deformities, no cyanosis or clubbing  ? ? ? ?   ?Assessment & Plan:  ? ? ?

## 2021-06-20 NOTE — Assessment & Plan Note (Signed)
We will decrease prednisone gradually, drop to 10 mg alternating with 5 mg for another month and then she can drop to 5 mg daily.  We might maintain her at this low-dose to at least have an exacerbation free.  Of 3 to 4 months before dropping any further. ? ?Continue Symbicort and Spiriva for now. ?May again consider pulmonary rehab in the future ?

## 2021-06-20 NOTE — Patient Instructions (Signed)
?  X Rx for pred 5 mg # 60 x 1 refill ?On Tue/ Th/ Sat take 10 mg pred  ?5 mg on other days ? ?Starting April 15, decrease to 5 mg daily ? ?X Appt with Dr Loanne Drilling - to revisit osteoporosis ? ? ?

## 2021-06-20 NOTE — Assessment & Plan Note (Signed)
She will continue using oxygen as needed. ?Previous ABG has not qualified for NIV but we may reconsider this if she continues to have exacerbations ?

## 2021-06-24 ENCOUNTER — Telehealth: Payer: Self-pay | Admitting: Pulmonary Disease

## 2021-06-24 ENCOUNTER — Ambulatory Visit (INDEPENDENT_AMBULATORY_CARE_PROVIDER_SITE_OTHER): Payer: Medicare Other | Admitting: Neurology

## 2021-06-24 ENCOUNTER — Other Ambulatory Visit: Payer: Self-pay

## 2021-06-24 DIAGNOSIS — J449 Chronic obstructive pulmonary disease, unspecified: Secondary | ICD-10-CM

## 2021-06-24 DIAGNOSIS — R55 Syncope and collapse: Secondary | ICD-10-CM

## 2021-06-24 DIAGNOSIS — R42 Dizziness and giddiness: Secondary | ICD-10-CM

## 2021-06-24 NOTE — Telephone Encounter (Signed)
Spoke with pt who states that she starting alternating prednisone doses ('5mg'$  one day '10mg'$  the next) on Saturday. Pt stated over the weekend she became very congested and began coughing up green thick mucus. Pt denies fever/chills/G I upset. Pt took a home Covid test which was negative. Pt states taking all inhalers as directed. Kim [name] please advise as Dr. Elsworth Soho is unavailable today.  ?

## 2021-06-24 NOTE — Telephone Encounter (Signed)
Put her in my 10:30 opening Thursday, CXR prior  ?Take mucinex '1200mg'$  twice daily. Increase prednisone to '20mg'$  until I see her

## 2021-06-24 NOTE — Telephone Encounter (Signed)
Called and spoke with patient. Got her scheduled for Thursday with beth. Order placed for CXR. ?

## 2021-06-26 ENCOUNTER — Other Ambulatory Visit: Payer: Self-pay

## 2021-06-26 ENCOUNTER — Emergency Department (HOSPITAL_COMMUNITY): Payer: Medicare Other

## 2021-06-26 ENCOUNTER — Encounter (HOSPITAL_COMMUNITY): Payer: Self-pay | Admitting: *Deleted

## 2021-06-26 ENCOUNTER — Inpatient Hospital Stay (HOSPITAL_COMMUNITY)
Admission: EM | Admit: 2021-06-26 | Discharge: 2021-06-30 | DRG: 191 | Disposition: A | Discharge status: Home / Self Care | Payer: Medicare Other | Attending: Internal Medicine | Admitting: Internal Medicine

## 2021-06-26 DIAGNOSIS — T380X5A Adverse effect of glucocorticoids and synthetic analogues, initial encounter: Secondary | ICD-10-CM | POA: Diagnosis present

## 2021-06-26 DIAGNOSIS — Z888 Allergy status to other drugs, medicaments and biological substances status: Secondary | ICD-10-CM

## 2021-06-26 DIAGNOSIS — E785 Hyperlipidemia, unspecified: Secondary | ICD-10-CM | POA: Diagnosis present

## 2021-06-26 DIAGNOSIS — Z7982 Long term (current) use of aspirin: Secondary | ICD-10-CM | POA: Diagnosis not present

## 2021-06-26 DIAGNOSIS — Z20822 Contact with and (suspected) exposure to covid-19: Secondary | ICD-10-CM | POA: Diagnosis present

## 2021-06-26 DIAGNOSIS — Z881 Allergy status to other antibiotic agents status: Secondary | ICD-10-CM

## 2021-06-26 DIAGNOSIS — I252 Old myocardial infarction: Secondary | ICD-10-CM

## 2021-06-26 DIAGNOSIS — Z79899 Other long term (current) drug therapy: Secondary | ICD-10-CM

## 2021-06-26 DIAGNOSIS — Z7952 Long term (current) use of systemic steroids: Secondary | ICD-10-CM | POA: Diagnosis not present

## 2021-06-26 DIAGNOSIS — Z91048 Other nonmedicinal substance allergy status: Secondary | ICD-10-CM

## 2021-06-26 DIAGNOSIS — M810 Age-related osteoporosis without current pathological fracture: Secondary | ICD-10-CM | POA: Diagnosis present

## 2021-06-26 DIAGNOSIS — I10 Essential (primary) hypertension: Secondary | ICD-10-CM | POA: Diagnosis present

## 2021-06-26 DIAGNOSIS — Z7951 Long term (current) use of inhaled steroids: Secondary | ICD-10-CM | POA: Diagnosis not present

## 2021-06-26 DIAGNOSIS — Z9981 Dependence on supplemental oxygen: Secondary | ICD-10-CM | POA: Diagnosis not present

## 2021-06-26 DIAGNOSIS — D72829 Elevated white blood cell count, unspecified: Secondary | ICD-10-CM | POA: Diagnosis present

## 2021-06-26 DIAGNOSIS — J441 Chronic obstructive pulmonary disease with (acute) exacerbation: Secondary | ICD-10-CM | POA: Diagnosis present

## 2021-06-26 DIAGNOSIS — J9621 Acute and chronic respiratory failure with hypoxia: Secondary | ICD-10-CM | POA: Diagnosis present

## 2021-06-26 DIAGNOSIS — J9611 Chronic respiratory failure with hypoxia: Secondary | ICD-10-CM | POA: Insufficient documentation

## 2021-06-26 DIAGNOSIS — Z885 Allergy status to narcotic agent status: Secondary | ICD-10-CM | POA: Diagnosis not present

## 2021-06-26 DIAGNOSIS — Z880 Allergy status to penicillin: Secondary | ICD-10-CM

## 2021-06-26 DIAGNOSIS — Z8262 Family history of osteoporosis: Secondary | ICD-10-CM

## 2021-06-26 DIAGNOSIS — Z87891 Personal history of nicotine dependence: Secondary | ICD-10-CM

## 2021-06-26 DIAGNOSIS — J9610 Chronic respiratory failure, unspecified whether with hypoxia or hypercapnia: Secondary | ICD-10-CM | POA: Diagnosis present

## 2021-06-26 DIAGNOSIS — F419 Anxiety disorder, unspecified: Secondary | ICD-10-CM | POA: Diagnosis present

## 2021-06-26 DIAGNOSIS — F32A Depression, unspecified: Secondary | ICD-10-CM | POA: Diagnosis present

## 2021-06-26 DIAGNOSIS — Z8249 Family history of ischemic heart disease and other diseases of the circulatory system: Secondary | ICD-10-CM | POA: Diagnosis not present

## 2021-06-26 LAB — CBC WITH DIFFERENTIAL/PLATELET
Abs Immature Granulocytes: 0.08 10*3/uL — ABNORMAL HIGH (ref 0.00–0.07)
Basophils Absolute: 0.1 10*3/uL (ref 0.0–0.1)
Basophils Relative: 0 %
Eosinophils Absolute: 0.1 10*3/uL (ref 0.0–0.5)
Eosinophils Relative: 0 %
HCT: 39.1 % (ref 36.0–46.0)
Hemoglobin: 12.5 g/dL (ref 12.0–15.0)
Immature Granulocytes: 1 %
Lymphocytes Relative: 5 %
Lymphs Abs: 0.9 10*3/uL (ref 0.7–4.0)
MCH: 31.9 pg (ref 26.0–34.0)
MCHC: 32 g/dL (ref 30.0–36.0)
MCV: 99.7 fL (ref 80.0–100.0)
Monocytes Absolute: 1.1 10*3/uL — ABNORMAL HIGH (ref 0.1–1.0)
Monocytes Relative: 7 %
Neutro Abs: 14.6 10*3/uL — ABNORMAL HIGH (ref 1.7–7.7)
Neutrophils Relative %: 87 %
Platelets: 328 10*3/uL (ref 150–400)
RBC: 3.92 MIL/uL (ref 3.87–5.11)
RDW: 14.2 % (ref 11.5–15.5)
WBC: 16.7 10*3/uL — ABNORMAL HIGH (ref 4.0–10.5)
nRBC: 0 % (ref 0.0–0.2)

## 2021-06-26 LAB — D-DIMER, QUANTITATIVE: D-Dimer, Quant: 0.43 ug/mL-FEU (ref 0.00–0.50)

## 2021-06-26 LAB — BLOOD GAS, VENOUS
Acid-Base Excess: 4 mmol/L — ABNORMAL HIGH (ref 0.0–2.0)
Bicarbonate: 30.6 mmol/L — ABNORMAL HIGH (ref 20.0–28.0)
Drawn by: 1405
O2 Saturation: 74.4 %
Patient temperature: 36.8
pCO2, Ven: 53 mmHg (ref 44–60)
pH, Ven: 7.37 (ref 7.25–7.43)
pO2, Ven: 40 mmHg (ref 32–45)

## 2021-06-26 LAB — BASIC METABOLIC PANEL
Anion gap: 8 (ref 5–15)
BUN: 12 mg/dL (ref 8–23)
CO2: 28 mmol/L (ref 22–32)
Calcium: 9.4 mg/dL (ref 8.9–10.3)
Chloride: 107 mmol/L (ref 98–111)
Creatinine, Ser: 0.81 mg/dL (ref 0.44–1.00)
GFR, Estimated: >60 mL/min (ref >60)
Glucose, Bld: 113 mg/dL — ABNORMAL HIGH (ref 70–99)
Potassium: 4.3 mmol/L (ref 3.5–5.1)
Sodium: 143 mmol/L (ref 135–145)

## 2021-06-26 LAB — TROPONIN I (HIGH SENSITIVITY): Troponin I (High Sensitivity): 22 ng/L — ABNORMAL HIGH (ref <18)

## 2021-06-26 LAB — PROCALCITONIN: Procalcitonin: <0.1 ng/mL

## 2021-06-26 LAB — I-STAT VENOUS BLOOD GAS, ED
Acid-Base Excess: 3 mmol/L — ABNORMAL HIGH (ref 0.0–2.0)
Bicarbonate: 28.7 mmol/L — ABNORMAL HIGH (ref 20.0–28.0)
Calcium, Ion: 1.18 mmol/L (ref 1.15–1.40)
HCT: 35 % — ABNORMAL LOW (ref 36.0–46.0)
Hemoglobin: 11.9 g/dL — ABNORMAL LOW (ref 12.0–15.0)
O2 Saturation: 94 %
Potassium: 4.3 mmol/L (ref 3.5–5.1)
Sodium: 139 mmol/L (ref 135–145)
TCO2: 30 mmol/L (ref 22–32)
pCO2, Ven: 46.5 mmHg (ref 44–60)
pH, Ven: 7.399 (ref 7.25–7.43)
pO2, Ven: 73 mmHg — ABNORMAL HIGH (ref 32–45)

## 2021-06-26 LAB — RESP PANEL BY RT-PCR (FLU A&B, COVID) ARPGX2
Influenza A by PCR: NEGATIVE
Influenza B by PCR: NEGATIVE
SARS Coronavirus 2 by RT PCR: NEGATIVE

## 2021-06-26 LAB — BRAIN NATRIURETIC PEPTIDE: B Natriuretic Peptide: 151.5 pg/mL — ABNORMAL HIGH (ref 0.0–100.0)

## 2021-06-26 MED ORDER — FUROSEMIDE 40 MG PO TABS: 40.0000 mg | ORAL_TABLET | Freq: Every day | ORAL | Status: DC | PRN

## 2021-06-26 MED ORDER — ONDANSETRON HCL 4 MG PO TABS: 4.0000 mg | ORAL_TABLET | Freq: Four times a day (QID) | ORAL | Status: DC | PRN

## 2021-06-26 MED ORDER — SODIUM CHLORIDE 0.9% FLUSH
3.0000 mL | Freq: Two times a day (BID) | INTRAVENOUS | Status: DC
  Administered 2021-06-26 – 2021-06-30 (×7): 3 mL via INTRAVENOUS

## 2021-06-26 MED ORDER — LORATADINE 10 MG PO TABS
10.0000 mg | ORAL_TABLET | Freq: Every day | ORAL | Status: DC
  Administered 2021-06-27 – 2021-06-30 (×4): 10 mg via ORAL
  Filled 2021-06-26 (×4): qty 1

## 2021-06-26 MED ORDER — HYDROCODONE-ACETAMINOPHEN 7.5-325 MG PO TABS: 1.0000 | ORAL_TABLET | Freq: Four times a day (QID) | ORAL | Status: DC | PRN

## 2021-06-26 MED ORDER — HYDRALAZINE HCL 20 MG/ML IJ SOLN: 10.0000 mg | INTRAMUSCULAR | Status: DC | PRN

## 2021-06-26 MED ORDER — METHYLPREDNISOLONE SODIUM SUCC 125 MG IJ SOLR
80.0000 mg | INTRAMUSCULAR | Status: DC
  Administered 2021-06-27 – 2021-06-28 (×2): 80 mg via INTRAVENOUS
  Filled 2021-06-26 (×2): qty 2

## 2021-06-26 MED ORDER — IPRATROPIUM-ALBUTEROL 0.5-2.5 (3) MG/3ML IN SOLN
3.0000 mL | Freq: Four times a day (QID) | RESPIRATORY_TRACT | Status: DC
  Administered 2021-06-26 – 2021-06-28 (×8): 3 mL via RESPIRATORY_TRACT
  Filled 2021-06-26 (×8): qty 3

## 2021-06-26 MED ORDER — ALPRAZOLAM 0.5 MG PO TABS
0.5000 mg | ORAL_TABLET | Freq: Three times a day (TID) | ORAL | Status: DC | PRN
  Administered 2021-06-26 – 2021-06-29 (×6): 0.5 mg via ORAL
  Filled 2021-06-26 (×6): qty 1

## 2021-06-26 MED ORDER — ASPIRIN EC 81 MG PO TBEC
81.0000 mg | DELAYED_RELEASE_TABLET | Freq: Every day | ORAL | Status: DC
  Administered 2021-06-26 – 2021-06-30 (×5): 81 mg via ORAL
  Filled 2021-06-26 (×5): qty 1

## 2021-06-26 MED ORDER — AZITHROMYCIN 500 MG IV SOLR
500.0000 mg | INTRAVENOUS | Status: DC
  Administered 2021-06-26 – 2021-06-28 (×3): 500 mg via INTRAVENOUS
  Filled 2021-06-26 (×4): qty 5

## 2021-06-26 MED ORDER — METOPROLOL SUCCINATE ER 25 MG PO TB24
12.5000 mg | ORAL_TABLET | Freq: Every day | ORAL | Status: DC
Start: 2021-06-27 — End: 2021-06-30
  Administered 2021-06-27 – 2021-06-30 (×4): 12.5 mg via ORAL
  Filled 2021-06-26 (×4): qty 1

## 2021-06-26 MED ORDER — ALBUTEROL SULFATE (2.5 MG/3ML) 0.083% IN NEBU
10.0000 mg/h | INHALATION_SOLUTION | Freq: Once | RESPIRATORY_TRACT | Status: AC
  Administered 2021-06-26: 10 mg/h via RESPIRATORY_TRACT
  Filled 2021-06-26: qty 12

## 2021-06-26 MED ORDER — IPRATROPIUM-ALBUTEROL 0.5-2.5 (3) MG/3ML IN SOLN: 3.0000 mL | Freq: Four times a day (QID) | RESPIRATORY_TRACT | Status: DC

## 2021-06-26 MED ORDER — ARFORMOTEROL TARTRATE 15 MCG/2ML IN NEBU
15.0000 ug | INHALATION_SOLUTION | Freq: Two times a day (BID) | RESPIRATORY_TRACT | Status: DC
  Administered 2021-06-26 – 2021-06-30 (×8): 15 ug via RESPIRATORY_TRACT
  Filled 2021-06-26 (×8): qty 2

## 2021-06-26 MED ORDER — PREDNISOLONE ACETATE 1 % OP SUSP
1.0000 [drp] | Freq: Every day | OPHTHALMIC | Status: DC
  Administered 2021-06-27 – 2021-06-30 (×4): 1 [drp] via OPHTHALMIC
  Filled 2021-06-26: qty 5

## 2021-06-26 MED ORDER — PAROXETINE HCL 30 MG PO TABS
30.0000 mg | ORAL_TABLET | Freq: Every day | ORAL | Status: DC
  Administered 2021-06-27 – 2021-06-30 (×4): 30 mg via ORAL
  Filled 2021-06-26 (×4): qty 1

## 2021-06-26 MED ORDER — BUDESONIDE 0.5 MG/2ML IN SUSP
0.5000 mg | Freq: Two times a day (BID) | RESPIRATORY_TRACT | Status: DC
  Administered 2021-06-26 – 2021-06-30 (×8): 0.5 mg via RESPIRATORY_TRACT
  Filled 2021-06-26 (×8): qty 2

## 2021-06-26 MED ORDER — IPRATROPIUM BROMIDE 0.02 % IN SOLN
0.5000 mg | Freq: Once | RESPIRATORY_TRACT | Status: AC
  Administered 2021-06-26: 0.5 mg via RESPIRATORY_TRACT
  Filled 2021-06-26: qty 2.5

## 2021-06-26 MED ORDER — GUAIFENESIN ER 600 MG PO TB12
1200.0000 mg | ORAL_TABLET | Freq: Two times a day (BID) | ORAL | Status: DC
  Administered 2021-06-26 – 2021-06-30 (×8): 1200 mg via ORAL
  Filled 2021-06-26 (×8): qty 2

## 2021-06-26 MED ORDER — ALBUTEROL SULFATE (2.5 MG/3ML) 0.083% IN NEBU: 2.5000 mg | INHALATION_SOLUTION | RESPIRATORY_TRACT | Status: DC | PRN

## 2021-06-26 MED ORDER — ONDANSETRON HCL 4 MG/2ML IJ SOLN
4.0000 mg | Freq: Four times a day (QID) | INTRAMUSCULAR | Status: DC | PRN
Start: 2021-06-26 — End: 2021-06-30

## 2021-06-26 MED ORDER — MAGNESIUM OXIDE -MG SUPPLEMENT 400 (240 MG) MG PO TABS
400.0000 mg | ORAL_TABLET | Freq: Every day | ORAL | Status: DC
  Administered 2021-06-26 – 2021-06-30 (×5): 400 mg via ORAL
  Filled 2021-06-26 (×5): qty 1

## 2021-06-26 MED ORDER — METHYLPREDNISOLONE SODIUM SUCC 125 MG IJ SOLR
125.0000 mg | Freq: Once | INTRAMUSCULAR | Status: AC
Start: 2021-06-26 — End: 2021-06-26
  Administered 2021-06-26: 125 mg via INTRAVENOUS
  Filled 2021-06-26: qty 2

## 2021-06-26 MED ORDER — LOSARTAN POTASSIUM 50 MG PO TABS
25.0000 mg | ORAL_TABLET | Freq: Every day | ORAL | Status: DC
  Administered 2021-06-27 – 2021-06-30 (×4): 25 mg via ORAL
  Filled 2021-06-26 (×4): qty 1

## 2021-06-26 MED ORDER — ENOXAPARIN SODIUM 40 MG/0.4ML IJ SOSY
40.0000 mg | PREFILLED_SYRINGE | INTRAMUSCULAR | Status: DC
  Administered 2021-06-26 – 2021-06-29 (×4): 40 mg via SUBCUTANEOUS
  Filled 2021-06-26 (×5): qty 0.4

## 2021-06-26 NOTE — Assessment & Plan Note (Addendum)
Home medication regimen includes Xanax 0.5 mg p.o. 3 times daily as needed and Paxil 30 mg daily.   ?-Continue home regimen as tolerated  ?

## 2021-06-26 NOTE — Assessment & Plan Note (Deleted)
O2 saturations were noted to be maintained on home oxygen of 2 L.  She had been placed on BiPAP due to work of breathing.  Initial venous blood gas however noted pH 7.399, PCO2 ?

## 2021-06-26 NOTE — Plan of Care (Signed)

## 2021-06-26 NOTE — ED Triage Notes (Signed)
BIB husband from home for sob, h/o COPD and CHF. Onset Saturday (4d ago). Wheezing noted. Endorses productive cough, yellow thick, HA and chills. States covid test tegative x2 over the last 2d. Denies CP, back pain, abd pain, fever. Denies swelling or weight gain. Has increased her home O2 from 2L to 3L. Increased wob noted, no distress. Husband at [facility].  ?

## 2021-06-26 NOTE — ED Notes (Signed)
Pt transported to xray 

## 2021-06-26 NOTE — Assessment & Plan Note (Addendum)
O2 saturations were noted to be maintained on home oxygen of 2 L.  She had been placed on BiPAP due to work of breathing.  Venous blood gas did not note any signs of hypercapnia. ?-Continuous pulse oximetry with oxygen maintain O2 saturation greater than 92% ? ?

## 2021-06-26 NOTE — Assessment & Plan Note (Addendum)
Patient presented with complaints of increasing shortness of breath, increased sputum production, and wheezing.  Chest x-ray noting hyperinflation consistent with COPD without acute abnormality appreciated.  Influenza and COVID-19 screening were negative.  Patient had been given Solu-Medrol 125 mg and breathing treatments while in the ED. ?-Incentive spirometry and flutter valve ?-Continue bronchodilators, inhaler ?-Solu-Medrol IV transition to p.o. when medically appropriate ?-Currently on azithromycin.  Checking chest x-ray today, sputum culture, will modify the antibiotics if needed ?-Mucinex ? ?

## 2021-06-26 NOTE — Progress Notes (Signed)
Pt transported to 5N via bipap, no apparent complications, pt appeared to tolerate well.  Floor RT report given and aware.   ?

## 2021-06-26 NOTE — Assessment & Plan Note (Addendum)
Chronic.  WBC elevated 16.7.  Patient reports that she has been on steroids for the last couple of months.  Denied any complaints of fever. ?-Continue to monitor ? ?

## 2021-06-26 NOTE — ED Notes (Signed)
Resting comfortably on CAT neb ?

## 2021-06-26 NOTE — ED Notes (Signed)
No changes, pending transport to floor with RN & RT ?

## 2021-06-26 NOTE — H&P (Signed)
History and Physical    Patient: Kim [name]:[id number] DOB: 05-03-1955 DOA: 06/26/2021 DOS: the patient was seen and examined on 06/26/2021 PCP: Shirline Frees, NP  Patient coming from: Home  Chief Complaint:  Chief Complaint  Patient presents with   Shortness of Breath   HPI: Kim [name] is a 66 y.o. 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  She had gone down to the beach with family over the weekend and now when symptoms started.  Reports that the drive was approximately 9-6/2 hours, but they took frequent stops because they were with an elderly family member.  Symptoms initially started off as a mild cough.  Sputum production increased, became more thick in consistency, and changed in color from clear to yellow.  She had just seen her pulmonologist Dr. Vassie Loll on 3/16.  At that time it was recommended that she alternate 10 mg and 5 mg every other day for an additional month before dropping  down to 5 mg daily.  She reports that she has been on prednisone for 3 months now has every time that she was attempted to be tapered off she did not do well.  Denies having any significant fever, leg swelling, or calf pain, associated symptoms include wheezing, mild increase in weight due to being on chronic steroids, and "chest heaviness".  She had notified her pulmonologist office over symptoms and was advised to increase prednisone to 20 mg daily.  By doing this patient reports symptoms progressively worsened and she was unable to rest at night due to feeling as though she could not breathe.  She was last hospitalized in 11/2020 for COPD exacerbation where  Upon admission into the emergency department patient was noted to be tachypneic  with O2 saturation maintained on home 2 L of oxygen.  Labs significant for WBC 16.7 and BNP 151.5.  Chest x-ray noted hyperinflation without acute process.  Influenza and COVID-19 screening were negative.  Patient has been given a continuous hour-long albuterol breathing treatment, Atrovent neb, and 125 mg of Solu-Medrol IV.  Review of Systems: As mentioned in the history of present illness. All other systems reviewed and are negative. 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   OSTEOARTHRITIS 06/05/2009   PARESTHESIA 08/24/2007   TOBACCO ABUSE 01/25/2010   Past Surgical History:  Procedure Laterality Date   ABDOMINAL HYSTERECTOMY     APPENDECTOMY  1969   CARDIAC CATHETERIZATION N/A 07/05/2015   Procedure: Left Heart Cath and Coronary Angiography;  Surgeon: Marykay Lex, MD;  Location: [facility] INVASIVE CV LAB;  Service: Cardiovascular;  Laterality: N/A;   CHOLECYSTECTOMY  1989   collapse lung  1990   COLONOSCOPY WITH PROPOFOL N/A 05/14/2018   Procedure: COLONOSCOPY WITH PROPOFOL;  Surgeon: Hilarie Fredrickson, MD;  Location: [facility]COPY;  Service: Endoscopy;  Laterality: N/A;   EXCISION MASS ABDOMINAL  2019   EXPLORATORY LAPAROTOMY  1969   LEFT HEART CATH AND CORONARY ANGIOGRAPHY N/A 11/19/2020   Procedure: LEFT HEART CATH AND CORONARY ANGIOGRAPHY;  Surgeon: Yvonne Kendall, MD;  Location: [facility] INVASIVE CV LAB;  Service: Cardiovascular;  Laterality: N/A;   POLYPECTOMY  05/14/2018  Procedure: POLYPECTOMY;  Surgeon: Hilarie Fredrickson, MD;  Location: [facility];  Service: Endoscopy;;   SALPINGOOPHORECTOMY  2019   Social History:  reports that she quit smoking about 7 years ago. Her smoking use included cigarettes. She has a 76.00 pack-year smoking history. She has never used smokeless tobacco. She reports that she does not drink alcohol and does not use  drugs.  Allergies  Allergen Reactions   Tape Other (See Comments)    SKIN TEARS EASILY!!!!   Daliresp [Roflumilast]     Skin peeling   Lisinopril Cough   Morphine And Related     hallucinations   Penicillins Hives and Other (See Comments)    Tolerates Omnicef Has patient had a PCN reaction causing immediate rash, facial/tongue/throat swelling, SOB or lightheadedness with hypotension: Yes Has patient had a PCN reaction causing severe rash involving mucus membranes or skin necrosis: No Has patient had a PCN reaction that required hospitalization No Has patient had a PCN reaction occurring within the last 10 years: No If all of the above answers are "NO", then may proceed with Cephalosporin use.    Omnicef [Cefdinir] Rash    Family History  Problem Relation Age of Onset   Cancer Mother        lung   Heart attack Mother    Hypertension Sister    Multiple sclerosis Brother    Diabetes Sister    Rheum arthritis Sister    Thyroid disease Sister    Multiple sclerosis Other    Alcohol abuse Other    Arthritis Other    Diabetes Other    Kidney disease Other    Cancer Other        lung,ovarian,skin, uterine   Stroke Other    Heart disease Other    Melanoma Other    Osteoporosis Other    Heart attack Maternal Uncle    Heart attack Other        NEICE   Heart attack Other        NEPHEW   Hypertension Brother    Stroke Maternal Grandmother    Colon cancer Neg Hx     Prior to Admission medications   Medication Sig Start Date End Date Taking? Authorizing Provider  albuterol (VENTOLIN HFA) 108 (90 Base) MCG/ACT inhaler Take two puffs every 4-6 hours as needed for shortness of breath/wheezing . 90 day supply 12/14/19  Yes Glenford Bayley, NP  ALPRAZolam Prudy Feeler) 0.5 MG tablet TAKE 1/2 TO 1 TABLET BY MOUTH THREE TIMES DAILY Patient taking differently: Take 0.5 mg by mouth 3 (three) times daily as needed for anxiety. 03/05/21  Yes Nafziger, Kandee Keen, NP  aspirin EC 81 MG EC tablet  Take 1 tablet (81 mg total) by mouth daily. 07/08/15  Yes Rhetta Mura, MD  cetirizine (ZYRTEC) 10 MG tablet Take 10 mg by mouth daily.   Yes [provider]  furosemide (LASIX) 40 MG tablet Take 1 tablet (40 mg total) by mouth daily as needed (for an overnight weight gain of 3-5 pounds of fluid). Patient taking differently: Take 40 mg by mouth daily as needed (overnight weight gain of 3-5 pounds of fluid). 11/20/20  Yes Elgergawy, Leana Roe, MD  HYDROcodone-acetaminophen (NORCO) 7.5-325 MG tablet Take 1 tablet by mouth every 6 (six) hours as needed for moderate pain. 06/04/21  Yes Nafziger, Kandee Keen, NP  ipratropium-albuterol (DUONEB) 0.5-2.5 (3) MG/3ML SOLN USE 3 ML VIA NEBULIZER EVERY 6 HOURS AS NEEDED.  DX: J44.9 Patient taking differently: Take 3 mLs  by nebulization every 6 (six) hours as needed (cough). 12/06/20  Yes Glenford Bayley, NP  losartan (COZAAR) 25 MG tablet Take 1 tablet (25 mg total) by mouth daily. 04/26/21  Yes Weaver, Scott T, PA-C  magnesium oxide (MAG-OX) 400 MG tablet Take 400 mg by mouth daily.   Yes [provider]  metoprolol succinate (TOPROL-XL) 25 MG 24 hr tablet Take 0.5 tablets (12.5 mg total) by mouth daily. 01/04/21  Yes Kathleene Hazel, MD  MUCINEX MAXIMUM STRENGTH 1200 MG TB12 Take 1,200 mg by mouth every 12 (twelve) hours.   Yes [provider]  nitroGLYCERIN (NITROSTAT) 0.4 MG SL tablet Place 1 tablet (0.4 mg total) under the tongue every 5 (five) minutes as needed for chest pain. 10/24/20  Yes Kathleene Hazel, MD  PARoxetine (PAXIL) 30 MG tablet Take 1 tablet (30 mg total) by mouth daily. 06/11/21  Yes Nafziger, Kandee Keen, NP  prednisoLONE acetate (PRED FORTE) 1 % ophthalmic suspension Place 1 drop into the right eye daily. 06/06/21  Yes [provider]  predniSONE (DELTASONE) 5 MG tablet Take 1 tablet (5 mg total) by mouth daily with breakfast. Patient taking differently: Take 20 mg by mouth daily with breakfast. 06/20/21   Yes Oretha Milch, MD  SYMBICORT 160-4.5 MCG/ACT inhaler INHALE 2 PUFFS TWICE DAILY Patient taking differently: 2 puffs 2 (two) times daily. 12/31/20  Yes Glenford Bayley, NP  Tiotropium Bromide Monohydrate (SPIRIVA RESPIMAT) 2.5 MCG/ACT AERS Inhale 2 puffs into the lungs daily. 12/06/20  Yes Glenford Bayley, NP  nystatin (MYCOSTATIN) 100000 UNIT/ML suspension Take 5 mLs (500,000 Units total) by mouth 4 (four) times daily as needed (for thrush). Patient not taking: Reported on 06/26/2021 04/03/21   Shirline Frees, NP  OXYGEN Inhale 2 L/min into the lungs continuous.    [provider]  predniSONE (DELTASONE) 10 MG tablet Take 1 tablet (10 mg total) by mouth daily with breakfast. Patient not taking: Reported on 06/26/2021 05/22/21   Oretha Milch, MD  rosuvastatin (CRESTOR) 20 MG tablet Take 20 mg by mouth at bedtime. Patient not taking: Reported on 06/26/2021 03/27/21   [provider]  Spacer/Aero Chamber Mouthpiece MISC 1 Device by Does not apply route as directed. 01/12/17   Roslynn Amble, MD    Physical Exam: Vitals:   06/26/21 1130 06/26/21 1145 06/26/21 1200 06/26/21 1245  BP: 112/71 121/66 125/69 125/66  Pulse: 83 81 85 94  Resp: (!) 21 (!) 21 (!) 24 (!) 28  Temp:      TempSrc:      SpO2: 99% 98% 97% 99%  Weight:       Constitutional: Elderly female currently in no acute distress Eyes: PERRL, lids and conjunctivae normal ENMT: Mucous membranes are moist. Posterior pharynx clear of any exudate or lesions. Neck: normal, supple, no masses, no thyromegaly Respiratory: Decreased overall aeration with expiratory wheezes appreciated throughout both lung fields.  Patient currently on BiPAP able to talk in nearly complete sentences. Cardiovascular: Regular rate and rhythm, no murmurs / rubs / gallops. No extremity edema. 2+ pedal pulses.  Abdomen: no tenderness, no masses palpated.   Bowel sounds positive.  Musculoskeletal: no clubbing / cyanosis. No joint  deformity upper and lower extremities. Good ROM, no contractures. Normal muscle tone.  Skin: no rashes, lesions, ulcers. No induration Neurologic: CN 2-12 grossly intact.  Strength 5/5 in all 4.  Psychiatric: Normal judgment and insight. Alert and oriented x 3. Normal mood.   Data Reviewed:  EKG  revealed normal sinus rhythm at 83 bpm   Assessment and Plan: COPD exacerbation [facility]) Patient presented with complaints of increasing shortness of breath, increased sputum production, and wheezing.  Chest x-ray noting hyperinflation consistent with COPD without acute abnormality appreciated.  Influenza and COVID-19 screening were negative.  Patient had been given Solu-Medrol 125 mg and breathing treatments while in the ED. -Admit to a progressive bed -Incentive spirometry and flutter valve -Check D-dimer given recent travel -DuoNebs 4 times daily and as needed -Pharmacy substitution of Brovana and budesonide nebs for Symbicort -Solu-Medrol IV transition to p.o. when medically appropriate -Azithromycin -Mucinex  Chronic respiratory failure (HCC) O2 saturations were noted to be maintained on home oxygen of 2 L.  She had been placed on BiPAP due to work of breathing.  Venous blood gas did not note any signs of hypercapnia. -Continuous pulse oximetry with oxygen maintain O2 saturation greater than 92% -Wean off BiPAP once able  Leukocytosis Chronic.  WBC elevated 16.7.  Patient reports that she has been on steroids for the last couple of months.  Denied any complaints of fever. -Continue to monitor  Essential hypertension -Continue losartan, metoprolol, and furosemide as needed  Chest heaviness Acute.  Patient with a prior history of Takotsubo cardiomyopathy was noted to have type II NSTEMI during last hospitalization in 11/2020.  EKG without significant ischemic changes.  Last EF noted to be 55-60% with normal diastolic parameters in 12/2020 -Add on cardiac troponin  Anxiety and  depression Home medication regimen includes Xanax 0.5 mg p.o. 3 times daily as needed and Paxil 30 mg daily.   -Continue home regimen as tolerated      Advance Care Planning:   Code Status: Full Code   Consults: None  Family Communication: None requested  Severity of Illness: The appropriate patient status for this patient is INPATIENT. Inpatient status is judged to be reasonable and necessary in order to provide the required intensity of service to ensure the patient's safety. The patient's presenting symptoms, physical exam findings, and initial radiographic and laboratory data in the context of their chronic comorbidities is felt to place them at high risk for further clinical deterioration. Furthermore, it is not anticipated that the patient will be medically stable for discharge from the hospital within 2 midnights of admission.   * I certify that at the point of admission it is my clinical judgment that the patient will require inpatient hospital care spanning beyond 2 midnights from the point of admission due to high intensity of service, high risk for further deterioration and high frequency of surveillance required.*  Author: Clydie Braun, MD 06/26/2021 1:01 PM  For on call review [email or web address].

## 2021-06-26 NOTE — ED Notes (Signed)
Resting comfortably/sleeping on Bipap. Easily arousable. Husband at [facility].  ?

## 2021-06-26 NOTE — Plan of Care (Signed)
?  Problem: Coping: ?Goal: Level of anxiety will decrease ?Outcome: Progressing ?  ?Problem: Pain Managment: ?Goal: General experience of comfort will improve ?Outcome: Progressing ?  ?Problem: Safety: ?Goal: Ability to remain free from injury will improve ?Outcome: Progressing ?  ?Problem: Skin Integrity: ?Goal: Risk for impaired skin integrity will decrease ?Outcome: Progressing ?  ?Problem: Activity: ?Goal: Will verbalize the importance of balancing activity with adequate rest periods ?Outcome: Progressing ?  ?

## 2021-06-26 NOTE — ED Provider Notes (Signed)
?[facility] ?Provider Note ? ? ?CSN: [id number] ?Arrival date & time: 06/26/21  1019 ? ?  ? ?History ? ?Chief Complaint  ?Patient presents with  ? Shortness of Breath  ? ? ?Kim [name] is a 66 y.o. female. ? ?The history is provided by the patient and medical records. No language interpreter was used.  ?Shortness of Breath ? ?65 year old female significant history of COPD currently on home oxygen at 2 L presenting to the ER with complaints of shortness of breath.  She report she went to the beach about 4 days ago.  While at the beach she begins to develop more productive cough going from clear sputum to now having yellow-green sputum.  For the past 2 nights she endorsed increased shortness of breath not adequate control using her usual home medication.  She also endorsed some body aches.  She tries taking 2 COVID test at home both came back negative.  She denies nausea vomiting diarrhea, hemoptysis, dysuria.  She was last seen by her pulmonologist 6 days ago for regular visit and initial plan was to decrease her chronic prednisone use.  She has not implemented that yet. ? ?Home Medications ?Prior to Admission medications   ?Medication Sig Start Date End Date Taking? Authorizing Provider  ?albuterol (VENTOLIN HFA) 108 (90 Base) MCG/ACT inhaler Take two puffs every 4-6 hours as needed for shortness of breath/wheezing . 90 day supply 12/14/19   Martyn Ehrich, NP  ?ALPRAZolam (XANAX) 0.5 MG tablet TAKE 1/2 TO 1 TABLET BY MOUTH THREE TIMES DAILY ?Patient taking differently: Take 0.25-0.5 mg by mouth 3 (three) times daily. 03/05/21   [name], NP  ?aspirin EC 81 MG EC tablet Take 1 tablet (81 mg total) by mouth daily. 07/08/15   Nita Sells, MD  ?cetirizine (ZYRTEC) 10 MG tablet Take 10 mg by mouth daily.    [provider]  ?furosemide (LASIX) 40 MG tablet Take 1 tablet (40 mg total) by mouth daily as needed (for an overnight weight gain of 3-5 pounds of  fluid). 11/20/20   Elgergawy, Silver Huguenin, MD  ?HYDROcodone-acetaminophen (NORCO) 7.5-325 MG tablet Take 1 tablet by mouth every 6 (six) hours as needed for moderate pain. 06/04/21   Nafziger, Tommi Rumps, NP  ?ipratropium-albuterol (DUONEB) 0.5-2.5 (3) MG/3ML SOLN USE 3 ML VIA NEBULIZER EVERY 6 HOURS AS NEEDED.  DX: J44.9 ?Patient taking differently: Take 3 mLs by nebulization every 6 (six) hours as needed (cough). 12/06/20   Martyn Ehrich, NP  ?losartan (COZAAR) 25 MG tablet Take 1 tablet (25 mg total) by mouth daily. ?Patient not taking: Reported on 06/26/2021 04/26/21   Richardson Dopp T, PA-C  ?losartan (COZAAR) 50 MG tablet Take 50 mg by mouth daily. 05/18/21   [provider]  ?magnesium oxide (MAG-OX) 400 MG tablet Take 400 mg by mouth daily.    [provider]  ?metoprolol succinate (TOPROL-XL) 25 MG 24 hr tablet Take 0.5 tablets (12.5 mg total) by mouth daily. 01/04/21   [name], MD  ?MUCINEX MAXIMUM STRENGTH 1200 MG TB12 Take 1,200 mg by mouth every 12 (twelve) hours.    [provider]  ?nitroGLYCERIN (NITROSTAT) 0.4 MG SL tablet Place 1 tablet (0.4 mg total) under the tongue every 5 (five) minutes as needed for chest pain. 10/24/20   [name], MD  ?nystatin (MYCOSTATIN) 100000 UNIT/ML suspension Take 5 mLs (500,000 Units total) by mouth 4 (four) times daily as needed (for thrush). ?Patient not taking: Reported  on 06/26/2021 04/03/21   [name], NP  ?OXYGEN Inhale 2 L/min into the lungs continuous.    [provider]  ?PARoxetine (PAXIL) 30 MG tablet Take 1 tablet (30 mg total) by mouth daily. 06/11/21   Nafziger, Tommi Rumps, NP  ?prednisoLONE acetate (PRED FORTE) 1 % ophthalmic suspension Place 1 drop into the right eye daily. 06/06/21   [provider]  ?predniSONE (DELTASONE) 10 MG tablet Take 1 tablet (10 mg total) by mouth daily with breakfast. ?Patient taking differently: Take 10 mg by mouth daily. 05/22/21   [name], MD  ?predniSONE  (DELTASONE) 5 MG tablet Take 1 tablet (5 mg total) by mouth daily with breakfast. 06/20/21   [name], MD  ?rosuvastatin (CRESTOR) 20 MG tablet Take 20 mg by mouth at bedtime. 03/27/21   [provider]  ?Spacer/Aero Chamber Mouthpiece MISC 1 Device by Does not apply route as directed. 01/12/17   Javier Glazier, MD  ?Dellis Anes 160-4.5 MCG/ACT inhaler INHALE 2 PUFFS TWICE DAILY ?Patient taking differently: 2 puffs 2 (two) times daily. 12/31/20   Martyn Ehrich, NP  ?Tiotropium Bromide Monohydrate (SPIRIVA RESPIMAT) 2.5 MCG/ACT AERS Inhale 2 puffs into the lungs daily. 12/06/20   Martyn Ehrich, NP  ?   ? ?Allergies    ?Tape, Daliresp [roflumilast], Lisinopril, Morphine and related, Penicillins, and Omnicef [cefdinir]   ? ?Review of Systems   ?Review of Systems  ?Respiratory:  Positive for shortness of breath.   ?All other systems reviewed and are negative. ? ?Physical Exam ?Updated Vital Signs ?BP 128/72   Pulse 89   Temp 98.2 ?F (36.8 ?C) (Oral)   Resp 20   SpO2 94%  ?Physical Exam ?Vitals and nursing note reviewed.  ?Constitutional:   ?   General: She is not in acute distress. ?   Appearance: She is well-developed.  ?HENT:  ?   Head: Atraumatic.  ?Eyes:  ?   Conjunctiva/sclera: Conjunctivae normal.  ?Cardiovascular:  ?   Rate and Rhythm: Normal rate and regular rhythm.  ?Pulmonary:  ?   Effort: Pulmonary effort is normal. Tachypnea present.  ?   Breath sounds: Decreased breath sounds and wheezing present.  ?Musculoskeletal:  ?   Cervical back: Neck supple.  ?   Right lower leg: No edema.  ?   Left lower leg: No edema.  ?Skin: ?   Findings: No rash.  ?Neurological:  ?   Mental Status: She is alert and oriented to person, place, and time.  ?Psychiatric:     ?   Mood and Affect: Mood normal.  ? ? ?ED Results / Procedures / Treatments   ?Labs ?(all labs ordered are listed, but only abnormal results are displayed) ?Labs Reviewed  ?BASIC METABOLIC PANEL - Abnormal; Notable for the following  components:  ?    Result Value  ? Glucose, Bld 113 (*)   ? All other components within normal limits  ?CBC WITH DIFFERENTIAL/PLATELET - Abnormal; Notable for the following components:  ? WBC 16.7 (*)   ? Neutro Abs 14.6 (*)   ? Monocytes Absolute 1.1 (*)   ? Abs Immature Granulocytes 0.08 (*)   ? All other components within normal limits  ?BRAIN NATRIURETIC PEPTIDE - Abnormal; Notable for the following components:  ? B Natriuretic Peptide 151.5 (*)   ? All other components within normal limits  ?RESP PANEL BY RT-PCR (FLU A&B, COVID) [id number]  ?BLOOD GAS, VENOUS  ? ? ?EKG ?EKG Interpretation ? ?Date/Time:  Wednesday June 18 2021 11:27:17 EDT ?Ventricular Rate:  83 ?PR Interval:  140 ?QRS Duration: 91 ?QT Interval:  364 ?QTC Calculation: 428 ?R Axis:   63 ?Text Interpretation: Sinus rhythm Consider anterior infarct Confirmed by Godfrey Pick [phone]) on 06/26/2021 12:26:06 PM ? ?Radiology ?DG Chest 2 View ? ?Result Date: 06/26/2021 ?CLINICAL DATA:  Shortness of breath, COPD EXAM: CHEST - 2 VIEW COMPARISON:  CT chest 05/08/2021, chest radiograph 11/15/2020 FINDINGS: The cardiomediastinal silhouette is normal. The lungs hyperinflated with flattening of the diaphragms consistent with underlying COPD. There is no focal consolidation or pulmonary edema. There is no pleural effusion or pneumothorax Compression deformity of 2 midthoracic and 1 lower thoracic vertebral body is unchanged. Cholecystectomy clips are noted. IMPRESSION: COPD.  No radiographic evidence of acute cardiopulmonary process. Electronically Signed   By: Valetta Mole M.D.   On: 06/26/2021 11:35   ? ?Procedures ?Procedures  ? ? ?Medications Ordered in ED ?Medications  ?methylPREDNISolone sodium succinate (SOLU-MEDROL) 125 mg/2 mL injection 125 mg (125 mg Intravenous Given 06/26/21 1114)  ?albuterol (PROVENTIL) (2.5 MG/3ML) 0.083% nebulizer solution (10 mg/hr Nebulization Given 06/26/21 1127)  ?ipratropium (ATROVENT) nebulizer solution 0.5 mg (0.5 mg Nebulization  Given 06/26/21 1127)  ? ? ?ED Course/ Medical Decision Making/ A&P ?  ?                        ?Medical Decision Making ?Amount and/or Complexity of Data Reviewed ?Labs: ordered. ?Radiology: ordered. ? ?Risk ?Prescrip

## 2021-06-26 NOTE — ED Notes (Signed)
EDP at [facility] 

## 2021-06-26 NOTE — ED Notes (Addendum)
No changes. RT paged for CAT neb. Pt to xray, remains on O2 [place].  ?

## 2021-06-27 ENCOUNTER — Ambulatory Visit: Payer: Medicare Other | Admitting: Primary Care

## 2021-06-27 DIAGNOSIS — J441 Chronic obstructive pulmonary disease with (acute) exacerbation: Secondary | ICD-10-CM | POA: Diagnosis not present

## 2021-06-27 LAB — CBC
HCT: 34.6 % — ABNORMAL LOW (ref 36.0–46.0)
Hemoglobin: 10.9 g/dL — ABNORMAL LOW (ref 12.0–15.0)
MCH: 31.3 pg (ref 26.0–34.0)
MCHC: 31.5 g/dL (ref 30.0–36.0)
MCV: 99.4 fL (ref 80.0–100.0)
Platelets: 306 10*3/uL (ref 150–400)
RBC: 3.48 MIL/uL — ABNORMAL LOW (ref 3.87–5.11)
RDW: 13.9 % (ref 11.5–15.5)
WBC: 13.7 10*3/uL — ABNORMAL HIGH (ref 4.0–10.5)
nRBC: 0 % (ref 0.0–0.2)

## 2021-06-27 LAB — BASIC METABOLIC PANEL
Anion gap: 6 (ref 5–15)
BUN: 23 mg/dL (ref 8–23)
CO2: 29 mmol/L (ref 22–32)
Calcium: 9 mg/dL (ref 8.9–10.3)
Chloride: 107 mmol/L (ref 98–111)
Creatinine, Ser: 0.83 mg/dL (ref 0.44–1.00)
GFR, Estimated: >60 mL/min (ref >60)
Glucose, Bld: 125 mg/dL — ABNORMAL HIGH (ref 70–99)
Potassium: 4.5 mmol/L (ref 3.5–5.1)
Sodium: 142 mmol/L (ref 135–145)

## 2021-06-27 LAB — HIV ANTIBODY (ROUTINE TESTING W REFLEX): HIV Screen 4th Generation wRfx: NONREACTIVE

## 2021-06-27 NOTE — Progress Notes (Signed)
Pt placed on BIPAP for night rest.  Tolerating well. 

## 2021-06-27 NOTE — Progress Notes (Signed)
?PROGRESS NOTE ? ?Kim [name]  DHR:[id number] DOB: Feb 23, 1956 DOA: 06/26/2021 ?PCP: [name], NP  ? ?Brief Narrative: ? ?Patient is a 66 year old  female with history of hypertension, hyperlipidemia, COPD on 2 L of oxygen at home, Takotsubo cardiomyopathy, depression/anxiety who presented with worsening cough/shortness of breath, increased sputum production.  She was recently hospitalized on August for COPD exacerbation.  On presentation, she was tachypneic, lab work showed leukocytosis.  Chest x-ray showed hyperinflation without acute process.  COVID/influenza screen test negative.  Patient was admitted for COPD exacerbation. ? ?Assessment & Plan: ? ?Active Problems: ?  COPD exacerbation ([place]) ?  Chronic respiratory failure ([place]) ?  Dyslipidemia ?  Anxiety and depression ?  Leukocytosis ? ? ?Assessment and Plan: ?COPD exacerbation ([place]) ?Patient presented with complaints of increasing shortness of breath, increased sputum production, and wheezing.  Chest x-ray noting hyperinflation consistent with COPD without acute abnormality appreciated.  Influenza and COVID-19 screening were negative.  Patient had been given Solu-Medrol 125 mg and breathing treatments while in the ED. ?-Incentive spirometry and flutter valve ?-Continue bronchodilators, inhaler ?-Solu-Medrol IV transition to p.o. when medically appropriate ?-Azithromycin ?-Mucinex ? ? ?Chronic respiratory failure ([place]) ?O2 saturations were noted to be maintained on home oxygen of 2 L.  She had been placed on BiPAP due to work of breathing.  Venous blood gas did not note any signs of hypercapnia. ?-Continuous pulse oximetry with oxygen maintain O2 saturation greater than 92% ? ? ?Leukocytosis ?Chronic.  WBC elevated 16.7.  Patient reports that she has been on steroids for the last couple of months.  Denied any complaints of fever. ?-Continue to monitor ? ? ?Anxiety and depression ?Home medication regimen includes Xanax 0.5 mg p.o. 3 times daily as needed  and Paxil 30 mg daily.   ?-Continue home regimen as tolerated  ? ? ? ? ? ?  ?  ? ?DVT prophylaxis:enoxaparin (LOVENOX) injection 40 mg Start: 06/26/21 1800 ? ? ?  Code Status: Full Code ? ?Family Communication: Husband at bedside ? ?Patient status:Inpatient ? ?Patient is from :Home ? ?Anticipated discharge IW:OEHO ? ?Estimated DC date:In 1-2 days ? ? ?Consultants: None ? ?Procedures:None ? ?Antimicrobials:  ?Anti-infectives (From admission, onward)  ? ? Start     Dose/Rate Route Frequency Ordered Stop  ? 06/26/21 1800  azithromycin (ZITHROMAX) 500 mg in sodium chloride 0.9 % 250 mL IVPB       ? 500 mg ?250 mL/hr over 60 Minutes Intravenous Every 24 hours 06/26/21 1605    ? ?  ? ? ?Subjective: ? ?Patient seen and examined at the bedside this morning.  Hemodynamically stable.  On 2 L of oxygen per minute.  Not in respiratory distress.  She says she feels better today but not to the point for discharging to home. ? ?Objective: ?Vitals:  ? 06/26/21 2100 06/26/21 2331 06/27/21 0026 06/27/21 0530  ?BP:   (!) 99/57 94/64  ?Pulse: 86 72 73 66  ?Resp: (!) '22 17 18 16  '$ ?Temp: (!) 97.4 ?F (36.3 ?C)  (!) 97.5 ?F (36.4 ?C) 97.6 ?F (36.4 ?C)  ?TempSrc:   Oral Oral  ?SpO2: 100% 100% 100% 99%  ?Weight:      ?Height:      ? ? ?Intake/Output Summary (Last 24 hours) at 06/27/2021 0807 ?Last data filed at 06/26/2021 1830 ?Gross per 24 hour  ?Intake 202.38 ml  ?Output --  ?Net 202.38 ml  ? ?Filed Weights  ? 06/26/21 1052 06/26/21 1517  ?Weight: 63.5 kg  64.9 kg  ? ? ?Examination: ? ?General exam: Overall comfortable, not in distress, pleasant female ?HEENT: PERRL ?Respiratory system: Diminished air entry bilaterally, expiratory wheezes ?Cardiovascular system: S1 & S2 heard, RRR.  ?Gastrointestinal system: Abdomen is nondistended, soft and nontender. ?Central nervous system: Alert and oriented ?Extremities: No edema, no clubbing ,no cyanosis ?Skin: No rashes, no ulcers,no icterus   ? ? ?Data Reviewed: I have personally reviewed following  labs and imaging studies ? ?CBC: ?Recent Labs  ?Lab 06/26/21 ?1108 06/26/21 ?1327 06/27/21 ?0352  ?WBC 16.7*  --  13.7*  ?NEUTROABS 14.6*  --   --   ?HGB 12.5 11.9* 10.9*  ?HCT 39.1 35.0* 34.6*  ?MCV 99.7  --  99.4  ?PLT 328  --  306  ? ?Basic Metabolic Panel: ?Recent Labs  ?Lab 06/26/21 ?1108 06/26/21 ?1327 06/27/21 ?0352  ?NA 143 139 142  ?K 4.3 4.3 4.5  ?CL 107  --  107  ?CO2 28  --  29  ?GLUCOSE 113*  --  125*  ?BUN 12  --  23  ?CREATININE 0.81  --  0.83  ?CALCIUM 9.4  --  9.0  ? ? ? ?Recent Results (from the past 240 hour(s))  ?Resp Panel by RT-PCR (Flu A&B, Covid) Nasopharyngeal Swab     Status: None  ? Collection Time: 06/26/21 12:15 PM  ? Specimen: Nasopharyngeal Swab; Nasopharyngeal(NP) swabs in vial transport medium  ?Result Value Ref Range Status  ? SARS Coronavirus 2 by RT PCR NEGATIVE NEGATIVE Final  ?  Comment: (NOTE) ?SARS-CoV-2 target nucleic acids are NOT DETECTED. ? ?The SARS-CoV-2 RNA is generally detectable in upper respiratory ?specimens during the acute phase of infection. The lowest ?concentration of SARS-CoV-2 viral copies this assay can detect is ?138 copies/mL. A negative result does not preclude SARS-Cov-2 ?infection and should not be used as the sole basis for treatment or ?other patient management decisions. A negative result may occur with  ?improper specimen collection/handling, submission of specimen other ?than nasopharyngeal swab, presence of viral mutation(s) within the ?areas targeted by this assay, and inadequate number of viral ?copies(<138 copies/mL). A negative result must be combined with ?clinical observations, patient history, and epidemiological ?information. The expected result is Negative. ? ?Fact Sheet for Patients:  ?EntrepreneurPulse.com.au ? ?Fact Sheet for Healthcare Providers:  ?IncredibleEmployment.be ? ?This test is no t yet approved or cleared by the [place] FDA and  ?has been authorized for detection and/or diagnosis of  SARS-CoV-2 by ?FDA under an Emergency Use Authorization (EUA). This EUA will remain  ?in effect (meaning this test can be used) for the duration of the ?COVID-19 declaration under Section 564(b)(1) of the Act, 21 ?U.S.C.section 360bbb-3(b)(1), unless the authorization is terminated  ?or revoked sooner.  ? ? ?  ? Influenza A by PCR NEGATIVE NEGATIVE Final  ? Influenza B by PCR NEGATIVE NEGATIVE Final  ?  Comment: (NOTE) ?The Xpert Xpress SARS-CoV-2/FLU/RSV plus assay is intended as an aid ?in the diagnosis of influenza from Nasopharyngeal swab specimens and ?should not be used as a sole basis for treatment. Nasal washings and ?aspirates are unacceptable for Xpert Xpress SARS-CoV-2/FLU/RSV ?testing. ? ?Fact Sheet for Patients: ?EntrepreneurPulse.com.au ? ?Fact Sheet for Healthcare Providers: ?IncredibleEmployment.be ? ?This test is not yet approved or cleared by the [place] FDA and ?has been authorized for detection and/or diagnosis of SARS-CoV-2 by ?FDA under an Emergency Use Authorization (EUA). This EUA will remain ?in effect (meaning this test can be used) for the duration of the ?COVID-19 declaration  under Section 564(b)(1) of the Act, 21 U.S.C. ?section 360bbb-3(b)(1), unless the authorization is terminated or ?revoked. ? ?Performed at [facility], [place] [address] [place], [place] ?[address] ?  ?  ? ?Radiology Studies: ?DG Chest 2 View ? ?Result Date: 06/26/2021 ?CLINICAL DATA:  Shortness of breath, COPD EXAM: CHEST - 2 VIEW COMPARISON:  CT chest 05/08/2021, chest radiograph 11/15/2020 FINDINGS: The cardiomediastinal silhouette is normal. The lungs hyperinflated with flattening of the diaphragms consistent with underlying COPD. There is no focal consolidation or pulmonary edema. There is no pleural effusion or pneumothorax Compression deformity of 2 midthoracic and 1 lower thoracic vertebral body is unchanged. Cholecystectomy clips are noted. IMPRESSION: COPD.  No  radiographic evidence of acute cardiopulmonary process. Electronically Signed   By: Valetta Mole M.D.   On: 06/26/2021 11:35   ? ?Scheduled Meds: ? arformoterol  15 mcg Nebulization BID  ? aspirin EC  8

## 2021-06-27 NOTE — Plan of Care (Signed)
  Problem: Health Behavior/Discharge Planning: Goal: Ability to manage health-related needs will improve Outcome: Progressing   Problem: Clinical Measurements: Goal: Ability to maintain clinical measurements within normal limits will improve Outcome: Progressing Goal: Will remain free from infection Outcome: Progressing   

## 2021-06-27 NOTE — Plan of Care (Signed)

## 2021-06-27 NOTE — Progress Notes (Signed)
Mobility Specialist Progress Note  ? ? 06/27/21 1200  ?Mobility  ?Activity Ambulated independently in hallway  ?Level of Assistance Standby assist, set-up cues, supervision of patient - no hands on  ?Assistive Device None  ?Distance Ambulated (ft) 250 ft  ?Activity Response Tolerated fair  ?$Mobility charge 1 Mobility  ? ?Pre-Mobility: 95 HR, 95% SpO2 ?Post-Mobility: 100 HR, 91% SpO2 ? ?Pt received in bed and agreeable. No complaints. SOB with exertion. Took x1 seated rest break. Returned to bed with call bell in reach.  ? ?Hildred Alamin ?Mobility Specialist  ?  ?

## 2021-06-27 NOTE — Evaluation (Signed)
Physical Therapy Evaluation & Discharge ?Patient Details ?Name: Kim [name] ?MRN: [id number] ?DOB: May 24, 1955 ?Today's Date: 06/27/2021 ? ?History of Present Illness ? Pt is a 66 y.o. female admitted 06/26/21 with progressive cough and SOB. CXR noted hyperinflation without acute process. Workup for COPD exacerbation. PMH includes COPD (2L O2 baseline), HTN, cardiomyopathy, osteoporosis, anxiety, depression. ?  ?Clinical Impression ? Patient evaluated by Physical Therapy with no further acute PT needs identified. PTA, pt independent without DME, limited community ambulatory due to [place], lives with supportive husband. Today, pt moving well, only requiring assist for line management. Pt demonstrates good awareness of breathing strategies, O2 needs and energy conservation techniques; pt even brought her own flutter valve and incentive spirometer from home to use while admitted. All education has been completed and the patient has no further questions. Acute PT is signing off. Thank you for this referral.  ? ?SATURATION QUALIFICATIONS: ?Patient Saturations on Room Air while Ambulating = 80% ?Patient Saturations on 3 Liters of oxygen while Ambulating = 93% ?  ? ?Recommendations for follow up therapy are one component of a multi-disciplinary discharge planning process, led by the attending physician.  Recommendations may be updated based on patient status, additional functional criteria and insurance authorization. ? ?Follow Up Recommendations No PT follow up ? ?  ?Assistance Recommended at Discharge PRN  ?Patient can return home with the following ? Assistance with cooking/housework ? ?  ?Equipment Recommendations None recommended by PT  ?Recommendations for Other Services ?    ?  ?Functional Status Assessment Patient has not had a recent decline in their functional status  ? ?  ?Precautions / Restrictions Precautions ?Precautions: Fall;Other (comment) ?Precaution Comments: Watch SpO2 (wears 2-3L O2  baseline) ?Restrictions ?Weight Bearing Restrictions: No  ? ?  ? ?Mobility ? Bed Mobility ?Overal bed mobility: Modified Independent ?  ?  ?  ?  ?  ?  ?General bed mobility comments: HOB elevated ?  ? ?Transfers ?Overall transfer level: Independent ?Equipment used: None ?  ?  ?  ?  ?  ?  ?  ?  ?  ? ?Ambulation/Gait ?Ambulation/Gait assistance: Supervision ?Gait Distance (Feet): 180 Feet ?Assistive device: None ?Gait Pattern/deviations: Step-through pattern, Decreased stride length ?Gait velocity: Decreased ?  ?  ?General Gait Details: Slow, mostly steady gait without DME; supervision/assist for line management; SpO2 down to 80% on RA, >/93% on 2L O2 [place] ? ?Stairs ?  ?  ?  ?  ?  ? ?Wheelchair Mobility ?  ? ?Modified Rankin (Stroke Patients Only) ?  ? ?  ? ?Balance Overall balance assessment: No apparent balance deficits (not formally assessed) ?  ?  ?  ?  ?  ?  ?  ?  ?  ?  ?  ?  ?  ?  ?  ?  ?  ?  ?   ? ? ? ?Pertinent Vitals/Pain Pain Assessment ?Pain Assessment: No/denies pain  ? ? ?Home Living Family/patient expects to be discharged to:: Private residence ?Living Arrangements: Spouse/significant other ?Available Help at Discharge: Family;Available 24 hours/day ?Type of Home: Apartment ?Home Access: Stairs to enter ?Entrance Stairs-Rails: Left ?Entrance Stairs-Number of Steps: 14 ?  ?Home Layout: One level ?Home Equipment: Air cabin crew (4 wheels) ?Additional Comments: lives in garage apartment  ?  ?Prior Function Prior Level of Function : Independent/Modified Independent ?  ?  ?  ?  ?  ?  ?Mobility Comments: Independent without DME; difficulty walking longer distances due to DOE, uses buggy at grocery  store ?ADLs Comments: Uses homemade shower chair for bathing; husband assists with household tasks as needed ?  ? ? ?Hand Dominance  ?   ? ?  ?Extremity/Trunk Assessment  ? Upper Extremity Assessment ?Upper Extremity Assessment: Overall WFL for tasks assessed ?  ? ?Lower Extremity Assessment ?Lower Extremity  Assessment: Overall WFL for tasks assessed ?  ? ?   ?Communication  ? Communication: No difficulties  ?Cognition Arousal/Alertness: Awake/alert ?Behavior During Therapy: [facility] for tasks assessed/performed ?Overall Cognitive Status: Within Functional Limits for tasks assessed ?  ?  ?  ?  ?  ?  ?  ?  ?  ?  ?  ?  ?  ?  ?  ?  ?  ?  ?  ? ?  ?General Comments General comments (skin integrity, edema, etc.): pt with good awareness of O2 needs and SpO2 monitoring at home. pt brought her own flutter valve and incentive spirometer from home to use while admitted. good awareness of energy conservation techniques ? ?  ?Exercises    ? ?Assessment/Plan  ?  ?PT Assessment Patient does not need any further PT services  ?PT Problem List   ? ?   ?  ?PT Treatment Interventions     ? ?PT Goals (Current goals can be found in the Care Plan section)  ?Acute Rehab PT Goals ?PT Goal Formulation: All assessment and education complete, DC therapy ? ?  ?Frequency   ?  ? ? ?Co-evaluation   ?  ?  ?  ?  ? ? ?  ?AM-PAC PT "6 Clicks" Mobility  ?Outcome Measure Help needed turning from your back to your side while in a flat bed without using bedrails?: None ?Help needed moving from lying on your back to sitting on the side of a flat bed without using bedrails?: None ?Help needed moving to and from a bed to a chair (including a wheelchair)?: None ?Help needed standing up from a chair using your arms (e.g., wheelchair or bedside chair)?: None ?Help needed to walk in hospital room?: A Little ?Help needed climbing 3-5 steps with a railing? : A Little ?6 Click Score: 22 ? ?  ?End of Session Equipment Utilized During Treatment: Oxygen ?Activity Tolerance: Patient tolerated treatment well ?Patient left: in chair;with call bell/phone within reach;with nursing/sitter in room ?Nurse Communication: Mobility status ?PT Visit Diagnosis: Other abnormalities of gait and mobility (R26.89) ?  ? ?Time: 8891-6945 ?PT Time Calculation (min) (ACUTE ONLY): 22  min ? ? ?Charges:   PT Evaluation ?$PT Eval Moderate Complexity: 1 Mod ?  ?  ?   ?Mabeline Caras, PT, DPT ?Acute Rehabilitation Services  ?Pager [phone] ?Office [phone] ? ?Derry Lory ?06/27/2021, 8:54 AM ? ?

## 2021-06-28 DIAGNOSIS — J441 Chronic obstructive pulmonary disease with (acute) exacerbation: Secondary | ICD-10-CM | POA: Diagnosis not present

## 2021-06-28 LAB — CBC WITH DIFFERENTIAL/PLATELET
Abs Immature Granulocytes: 0.17 10*3/uL — ABNORMAL HIGH (ref 0.00–0.07)
Basophils Absolute: 0 10*3/uL (ref 0.0–0.1)
Basophils Relative: 0 %
Eosinophils Absolute: 0 10*3/uL (ref 0.0–0.5)
Eosinophils Relative: 0 %
HCT: 33.4 % — ABNORMAL LOW (ref 36.0–46.0)
Hemoglobin: 10.7 g/dL — ABNORMAL LOW (ref 12.0–15.0)
Immature Granulocytes: 1 %
Lymphocytes Relative: 15 %
Lymphs Abs: 2.5 10*3/uL (ref 0.7–4.0)
MCH: 31.8 pg (ref 26.0–34.0)
MCHC: 32 g/dL (ref 30.0–36.0)
MCV: 99.1 fL (ref 80.0–100.0)
Monocytes Absolute: 1.3 10*3/uL — ABNORMAL HIGH (ref 0.1–1.0)
Monocytes Relative: 8 %
Neutro Abs: 12.2 10*3/uL — ABNORMAL HIGH (ref 1.7–7.7)
Neutrophils Relative %: 76 %
Platelets: 317 10*3/uL (ref 150–400)
RBC: 3.37 MIL/uL — ABNORMAL LOW (ref 3.87–5.11)
RDW: 13.8 % (ref 11.5–15.5)
WBC: 16.3 10*3/uL — ABNORMAL HIGH (ref 4.0–10.5)
nRBC: 0.1 % (ref 0.0–0.2)

## 2021-06-28 MED ORDER — IPRATROPIUM-ALBUTEROL 0.5-2.5 (3) MG/3ML IN SOLN
3.0000 mL | RESPIRATORY_TRACT | Status: DC
  Administered 2021-06-28 – 2021-06-29 (×5): 3 mL via RESPIRATORY_TRACT
  Filled 2021-06-28 (×5): qty 3

## 2021-06-28 MED ORDER — METHYLPREDNISOLONE SODIUM SUCC 125 MG IJ SOLR
120.0000 mg | INTRAMUSCULAR | Status: DC
  Administered 2021-06-29 – 2021-06-30 (×2): 120 mg via INTRAVENOUS
  Filled 2021-06-28 (×2): qty 2

## 2021-06-28 NOTE — Progress Notes (Signed)
?PROGRESS NOTE ? ?Kim [name]  SVX:[id number] DOB: May 20, 1955 DOA: 06/26/2021 ?PCP: [name], NP  ? ?Brief Narrative: ? ?Patient is a 66 year old  female with history of hypertension, hyperlipidemia, COPD on 2 L of oxygen at home, Takotsubo cardiomyopathy, depression/anxiety who presented with worsening cough/shortness of breath, increased sputum production.  She was recently hospitalized on August for COPD exacerbation.  On presentation, she was tachypneic, lab work showed leukocytosis.  Chest x-ray showed hyperinflation without acute process.  COVID/influenza screen test negative.  Patient was admitted for COPD exacerbation. ? ?Assessment & Plan: ? ?Active Problems: ?  COPD exacerbation ([place]) ?  Chronic respiratory failure ([place]) ?  Dyslipidemia ?  Anxiety and depression ?  Leukocytosis ? ? ?Assessment and Plan: ?COPD exacerbation ([place]) ?Patient presented with complaints of increasing shortness of breath, increased sputum production, and wheezing.  Chest x-ray noting hyperinflation consistent with COPD without acute abnormality appreciated.  Influenza and COVID-19 screening were negative.  Patient had been given Solu-Medrol 125 mg and breathing treatments while in the ED. ?-Incentive spirometry and flutter valve ?-Continue bronchodilators, inhaler ?-Solu-Medrol IV transition to p.o. when medically appropriate ?-Azithromycin ?-Mucinex ? ? ?Chronic respiratory failure ([place]) ?O2 saturations were noted to be maintained on home oxygen of 2 L.  She had been placed on BiPAP due to work of breathing.  Venous blood gas did not note any signs of hypercapnia. ?-Continuous pulse oximetry with oxygen maintain O2 saturation greater than 92% ? ? ?Leukocytosis ?Chronic.  WBC elevated 16.7.  Patient reports that she has been on steroids for the last couple of months.  Denied any complaints of fever. ?-Continue to monitor ? ? ?Anxiety and depression ?Home medication regimen includes Xanax 0.5 mg p.o. 3 times daily as needed  and Paxil 30 mg daily.   ?-Continue home regimen as tolerated  ? ? ? ? ? ?  ?  ? ?DVT prophylaxis:enoxaparin (LOVENOX) injection 40 mg Start: 06/26/21 1800 ? ? ?  Code Status: Full Code ? ?Family Communication: Husband at bedside on 06/26/21 ? ?Patient status:Inpatient ? ?Patient is from :Home ? ?Anticipated discharge QZ:RAQT ? ?Estimated DC date:In 1-2 days ? ? ?Consultants: None ? ?Procedures:None ? ?Antimicrobials:  ?Anti-infectives (From admission, onward)  ? ? Start     Dose/Rate Route Frequency Ordered Stop  ? 06/26/21 1800  azithromycin (ZITHROMAX) 500 mg in sodium chloride 0.9 % 250 mL IVPB       ? 500 mg ?250 mL/hr over 60 Minutes Intravenous Every 24 hours 06/26/21 1605    ? ?  ? ? ?Subjective: ? ?Patient seen and examined at bedside this morning.  Hemodynamically stable.  Feels little better than yesterday but still coughing a lot, bringing up green phlegm.  On auscultation wheezing were noted.  Not ready for discharge ?Objective: ?Vitals:  ? 06/27/21 2125 06/28/21 0549 06/28/21 0550 06/28/21 0740  ?BP: 112/61 (!) 111/58  (!) 127/57  ?Pulse: 92 63 65   ?Resp: (!) 25 (!) 23 (!) 23   ?Temp:  (!) 96.2 ?F (35.7 ?C) 98.1 ?F (36.7 ?C) 98.1 ?F (36.7 ?C)  ?TempSrc:  Axillary Oral Oral  ?SpO2: 95% 100% 100%   ?Weight:      ?Height:      ? ? ?Intake/Output Summary (Last 24 hours) at 06/28/2021 1124 ?Last data filed at 06/27/2021 1800 ?Gross per 24 hour  ?Intake 240 ml  ?Output 1 ml  ?Net 239 ml  ? ?Filed Weights  ? 06/26/21 1052 06/26/21 1517  ?Weight: 63.5 kg  64.9 kg  ? ? ?Examination: ? ?General exam: Overall comfortable, not in distress, pleasant female ?HEENT: PERRL ?Respiratory system: Bilateral expiratory wheezing, diminished air sounds cardiovascular system: S1 & S2 heard, RRR.  ?Gastrointestinal system: Abdomen is nondistended, soft and nontender. ?Central nervous system: Alert and oriented ?Extremities: No edema, no clubbing ,no cyanosis ?Skin: No rashes, no ulcers,no icterus   ? ? ?Data Reviewed: I have  personally reviewed following labs and imaging studies ? ?CBC: ?Recent Labs  ?Lab 06/26/21 ?1108 06/26/21 ?1327 06/27/21 ?0352 06/28/21 ?9741  ?WBC 16.7*  --  13.7* 16.3*  ?NEUTROABS 14.6*  --   --  12.2*  ?HGB 12.5 11.9* 10.9* 10.7*  ?HCT 39.1 35.0* 34.6* 33.4*  ?MCV 99.7  --  99.4 99.1  ?PLT 328  --  306 317  ? ?Basic Metabolic Panel: ?Recent Labs  ?Lab 06/26/21 ?1108 06/26/21 ?1327 06/27/21 ?0352  ?NA 143 139 142  ?K 4.3 4.3 4.5  ?CL 107  --  107  ?CO2 28  --  29  ?GLUCOSE 113*  --  125*  ?BUN 12  --  23  ?CREATININE 0.81  --  0.83  ?CALCIUM 9.4  --  9.0  ? ? ? ?Recent Results (from the past 240 hour(s))  ?Resp Panel by RT-PCR (Flu A&B, Covid) Nasopharyngeal Swab     Status: None  ? Collection Time: 06/26/21 12:15 PM  ? Specimen: Nasopharyngeal Swab; Nasopharyngeal(NP) swabs in vial transport medium  ?Result Value Ref Range Status  ? SARS Coronavirus 2 by RT PCR NEGATIVE NEGATIVE Final  ?  Comment: (NOTE) ?SARS-CoV-2 target nucleic acids are NOT DETECTED. ? ?The SARS-CoV-2 RNA is generally detectable in upper respiratory ?specimens during the acute phase of infection. The lowest ?concentration of SARS-CoV-2 viral copies this assay can detect is ?138 copies/mL. A negative result does not preclude SARS-Cov-2 ?infection and should not be used as the sole basis for treatment or ?other patient management decisions. A negative result may occur with  ?improper specimen collection/handling, submission of specimen other ?than nasopharyngeal swab, presence of viral mutation(s) within the ?areas targeted by this assay, and inadequate number of viral ?copies(<138 copies/mL). A negative result must be combined with ?clinical observations, patient history, and epidemiological ?information. The expected result is Negative. ? ?Fact Sheet for Patients:  ?EntrepreneurPulse.com.au ? ?Fact Sheet for Healthcare Providers:  ?IncredibleEmployment.be ? ?This test is no t yet approved or cleared by the  [place] FDA and  ?has been authorized for detection and/or diagnosis of SARS-CoV-2 by ?FDA under an Emergency Use Authorization (EUA). This EUA will remain  ?in effect (meaning this test can be used) for the duration of the ?COVID-19 declaration under Section 564(b)(1) of the Act, 21 ?U.S.C.section 360bbb-3(b)(1), unless the authorization is terminated  ?or revoked sooner.  ? ? ?  ? Influenza A by PCR NEGATIVE NEGATIVE Final  ? Influenza B by PCR NEGATIVE NEGATIVE Final  ?  Comment: (NOTE) ?The Xpert Xpress SARS-CoV-2/FLU/RSV plus assay is intended as an aid ?in the diagnosis of influenza from Nasopharyngeal swab specimens and ?should not be used as a sole basis for treatment. Nasal washings and ?aspirates are unacceptable for Xpert Xpress SARS-CoV-2/FLU/RSV ?testing. ? ?Fact Sheet for Patients: ?EntrepreneurPulse.com.au ? ?Fact Sheet for Healthcare Providers: ?IncredibleEmployment.be ? ?This test is not yet approved or cleared by the [place] FDA and ?has been authorized for detection and/or diagnosis of SARS-CoV-2 by ?FDA under an Emergency Use Authorization (EUA). This EUA will remain ?in effect (meaning this test can be  used) for the duration of the ?COVID-19 declaration under Section 564(b)(1) of the Act, 21 U.S.C. ?section 360bbb-3(b)(1), unless the authorization is terminated or ?revoked. ? ?Performed at [facility], [place] [address], [place], [place] ?[address] ?  ?  ? ?Radiology Studies: ?No results found. ? ?Scheduled Meds: ? arformoterol  15 mcg Nebulization BID  ? aspirin EC  81 mg Oral Daily  ? budesonide (PULMICORT) nebulizer solution  0.5 mg Nebulization BID  ? enoxaparin (LOVENOX) injection  40 mg Subcutaneous Q24H  ? guaiFENesin  1,200 mg Oral Q12H  ? ipratropium-albuterol  3 mL Nebulization QID  ? loratadine  10 mg Oral Daily  ? losartan  25 mg Oral Daily  ? magnesium oxide  400 mg Oral Daily  ? methylPREDNISolone (SOLU-MEDROL) injection  80 mg  Intravenous Q24H  ? metoprolol succinate  12.5 mg Oral Daily  ? PARoxetine  30 mg Oral Daily  ? prednisoLONE acetate  1 drop Right Eye Daily  ? sodium chloride flush  3 mL Intravenous Q12H  ? ?Continuous In

## 2021-06-28 NOTE — Progress Notes (Addendum)
Mobility Specialist: Progress Note ? ? 06/28/21 1525  ?Mobility  ?Activity Ambulated independently in hallway  ?Level of Assistance Independent after set-up  ?Assistive Device None  ?Distance Ambulated (ft) 240 ft ?(120'x2)  ?Activity Response Tolerated well  ?$Mobility charge 1 Mobility  ? ?Pre-Mobility on 2 L/min [place]: 84 HR, 95% SpO2 ?Post-Mobility on 3 L/min [place]: 82 HR, 96% SpO2 ? ?Pt received in bed and agreeable to ambulation. Ambulated on 3 L/min [place] with no c/o throughout. Stopped x1 for short seated break, then back to the room. Pt back in bed with call bell and phone at her side.  ? ?Harrell Gave Kim [name] ?Mobility Specialist ?Mobility Specialist [place]: [phone] ?Mobility Specialist [place]: [phone] ? ?

## 2021-06-28 NOTE — Care Management Important Message (Signed)
Important Message ? ?Patient Details  ?Name: Kim [name] ?MRN: [id number] ?Date of Birth: 27-Feb-1956 ? ? ?Medicare Important Message Given:  Yes ? ? ? ? ?Levada Dy  Joas Motton-Martin ?06/28/2021, 2:17 PM ?

## 2021-06-28 NOTE — Progress Notes (Signed)
Mobility Specialist: Progress Note ? ? 06/28/21 1746  ?Mobility  ?Activity Ambulated independently in hallway  ?Level of Assistance Independent after set-up  ?Assistive Device None  ?Distance Ambulated (ft) 240 ft  ?Activity Response Tolerated well  ?$Mobility charge 1 Mobility  ? ?Received pt in bed having no complaints and agreeable to mobility. Ambulated on 3 L/min [place]. Asymptomatic throughout ambulation, returned back to bed w/ call bell in reach and all needs met. ? ?Harrell Gave Takoda Janowiak ?Mobility Specialist ?Mobility Specialist [place]: [phone] ?Mobility Specialist [place]: [phone] ? ?

## 2021-06-29 ENCOUNTER — Inpatient Hospital Stay (HOSPITAL_COMMUNITY): Payer: Medicare Other

## 2021-06-29 ENCOUNTER — Encounter (HOSPITAL_COMMUNITY): Payer: Self-pay | Admitting: Internal Medicine

## 2021-06-29 DIAGNOSIS — J441 Chronic obstructive pulmonary disease with (acute) exacerbation: Secondary | ICD-10-CM | POA: Diagnosis not present

## 2021-06-29 MED ORDER — CHLORHEXIDINE GLUCONATE 0.12 % MT SOLN
15.0000 mL | Freq: Two times a day (BID) | OROMUCOSAL | Status: DC
  Administered 2021-06-28 – 2021-06-30 (×4): 15 mL via OROMUCOSAL
  Filled 2021-06-29 (×3): qty 15

## 2021-06-29 MED ORDER — AZITHROMYCIN 500 MG PO TABS
500.0000 mg | ORAL_TABLET | Freq: Every evening | ORAL | Status: DC
  Administered 2021-06-29: 500 mg via ORAL
  Filled 2021-06-29 (×2): qty 1

## 2021-06-29 MED ORDER — IPRATROPIUM-ALBUTEROL 0.5-2.5 (3) MG/3ML IN SOLN
3.0000 mL | Freq: Three times a day (TID) | RESPIRATORY_TRACT | Status: DC
  Administered 2021-06-29 – 2021-06-30 (×3): 3 mL via RESPIRATORY_TRACT
  Filled 2021-06-29 (×2): qty 3

## 2021-06-29 MED ORDER — ORAL CARE MOUTH RINSE
15.0000 mL | Freq: Two times a day (BID) | OROMUCOSAL | Status: DC
  Administered 2021-06-29 – 2021-06-30 (×3): 15 mL via OROMUCOSAL

## 2021-06-29 NOTE — Progress Notes (Signed)
?PROGRESS NOTE ? ?KOURTLYNN TREVOR  EML:[id number] DOB: [birth year] DOA: 06/26/2021 ?PCP: [name], NP  ? ?Brief Narrative: ? ?Patient is a 66 year old  female with history of hypertension, hyperlipidemia, COPD on 2 L of oxygen at home, Takotsubo cardiomyopathy, depression/anxiety who presented with worsening cough/shortness of breath, increased sputum production.  She was recently hospitalized on August for COPD exacerbation.  On presentation, she was tachypneic, lab work showed leukocytosis.  Chest x-ray showed hyperinflation without acute process.  COVID/influenza screen test negative.  Patient was admitted for COPD exacerbation.  Prolonged hospital course with persistent wheezing, cough ? ?Assessment & Plan: ? ?Active Problems: ?  COPD exacerbation ([place]) ?  Chronic respiratory failure ([place]) ?  Dyslipidemia ?  Anxiety and depression ?  Leukocytosis ? ? ?Assessment and Plan: ?COPD exacerbation ([place]) ?Patient presented with complaints of increasing shortness of breath, increased sputum production, and wheezing.  Chest x-ray noting hyperinflation consistent with COPD without acute abnormality appreciated.  Influenza and COVID-19 screening were negative.  Patient had been given Solu-Medrol 125 mg and breathing treatments while in the ED. ?-Incentive spirometry and flutter valve ?-Continue bronchodilators, inhaler ?-Solu-Medrol IV transition to p.o. when medically appropriate ?-Currently on azithromycin.  Checking chest x-ray today, sputum culture, will modify the antibiotics if needed ?-Mucinex ? ? ?Chronic respiratory failure ([place]) ?O2 saturations were noted to be maintained on home oxygen of 2 L.  She had been placed on BiPAP due to work of breathing.  Venous blood gas did not note any signs of hypercapnia. ?-Continuous pulse oximetry with oxygen maintain O2 saturation greater than 92% ? ? ?Leukocytosis ?Chronic.  WBC elevated 16.7.  Patient reports that she has been on steroids for the last couple of months.   Denied any complaints of fever. ?-Continue to monitor ? ? ?Anxiety and depression ?Home medication regimen includes Xanax 0.5 mg p.o. 3 times daily as needed and Paxil 30 mg daily.   ?-Continue home regimen as tolerated  ? ? ? ?  ?  ? ?DVT prophylaxis:enoxaparin (LOVENOX) injection 40 mg Start: 06/26/21 1800 ? ? ?  Code Status: Full Code ? ?Family Communication: Husband at bedside on 06/26/21 ? ?Patient status:Inpatient ? ?Patient is from :Home ? ?Anticipated discharge FH:[facility] ? ?Estimated DC date:In 1-2 days ? ? ?Consultants: None ? ?Procedures:None ? ?Antimicrobials:  ?Anti-infectives (From admission, onward)  ? ? Start     Dose/Rate Route Frequency Ordered Stop  ? 06/26/21 1800  azithromycin (ZITHROMAX) 500 mg in sodium chloride 0.9 % 250 mL IVPB       ? 500 mg ?250 mL/hr over 60 Minutes Intravenous Every 24 hours 06/26/21 1605    ? ?  ? ? ?Subjective: ? ?Patient seen and examined at bedside this morning hemodynamically stable.  Still coughing, still feels little better than yesterday.  Still found to have bilateral expiratory wheezing.  She is bringing up green sputum. ? ? ?Objective: ?Vitals:  ? 06/29/21 0337 06/29/21 0500 06/29/21 0805 06/29/21 0831  ?BP:  136/76 126/64   ?Pulse:  68 69   ?Resp:  14 16   ?Temp:  97.7 ?F (36.5 ?C) 98.4 ?F (36.9 ?C)   ?TempSrc:  Oral Oral   ?SpO2: 100% 100% 100% 100%  ?Weight:      ?Height:      ? ? ?Intake/Output Summary (Last 24 hours) at 06/29/2021 1053 ?Last data filed at 06/28/2021 1900 ?Gross per 24 hour  ?Intake 297.46 ml  ?Output --  ?Net 297.46 ml  ? ?Filed Weights  ?  06/26/21 1052 06/26/21 1517 06/28/21 2000  ?Weight: 63.5 kg 64.9 kg 62.5 kg  ? ? ?Examination: ? ?General exam: Overall comfortable, not in distress, deconditioned pleasant female ?HEENT: PERRL ?Respiratory system: B/L expiratory wheezing ?Cardiovascular system: S1 & S2 heard, RRR.  ?Gastrointestinal system: Abdomen is nondistended, soft and nontender. ?Central nervous system: Alert and oriented ?Extremities:  No edema, no clubbing ,no cyanosis ?Skin: No rashes, no ulcers,no icterus   ? ? ?Data Reviewed: I have personally reviewed following labs and imaging studies ? ?CBC: ?Recent Labs  ?Lab 06/26/21 ?1108 06/26/21 ?1327 06/27/21 ?0352 06/28/21 ?8527  ?WBC 16.7*  --  13.7* 16.3*  ?NEUTROABS 14.6*  --   --  12.2*  ?HGB 12.5 11.9* 10.9* 10.7*  ?HCT 39.1 35.0* 34.6* 33.4*  ?MCV 99.7  --  99.4 99.1  ?PLT 328  --  306 317  ? ?Basic Metabolic Panel: ?Recent Labs  ?Lab 06/26/21 ?1108 06/26/21 ?1327 06/27/21 ?0352  ?NA 143 139 142  ?K 4.3 4.3 4.5  ?CL 107  --  107  ?CO2 28  --  29  ?GLUCOSE 113*  --  125*  ?BUN 12  --  23  ?CREATININE 0.81  --  0.83  ?CALCIUM 9.4  --  9.0  ? ? ? ?Recent Results (from the past 240 hour(s))  ?Resp Panel by RT-PCR (Flu A&B, Covid) Nasopharyngeal Swab     Status: None  ? Collection Time: 06/26/21 12:15 PM  ? Specimen: Nasopharyngeal Swab; Nasopharyngeal(NP) swabs in vial transport medium  ?Result Value Ref Range Status  ? SARS Coronavirus 2 by RT PCR NEGATIVE NEGATIVE Final  ?  Comment: (NOTE) ?SARS-CoV-2 target nucleic acids are NOT DETECTED. ? ?The SARS-CoV-2 RNA is generally detectable in upper respiratory ?specimens during the acute phase of infection. The lowest ?concentration of SARS-CoV-2 viral copies this assay can detect is ?138 copies/mL. A negative result does not preclude SARS-Cov-2 ?infection and should not be used as the sole basis for treatment or ?other patient management decisions. A negative result may occur with  ?improper specimen collection/handling, submission of specimen other ?than nasopharyngeal swab, presence of viral mutation(s) within the ?areas targeted by this assay, and inadequate number of viral ?copies(<138 copies/mL). A negative result must be combined with ?clinical observations, patient history, and epidemiological ?information. The expected result is Negative. ? ?Fact Sheet for Patients:  ?EntrepreneurPulse.com.au ? ?Fact Sheet for Healthcare  Providers:  ?IncredibleEmployment.be ? ?This test is no t yet approved or cleared by the [place] FDA and  ?has been authorized for detection and/or diagnosis of SARS-CoV-2 by ?FDA under an Emergency Use Authorization (EUA). This EUA will remain  ?in effect (meaning this test can be used) for the duration of the ?COVID-19 declaration under Section 564(b)(1) of the Act, 21 ?U.S.C.section 360bbb-3(b)(1), unless the authorization is terminated  ?or revoked sooner.  ? ? ?  ? Influenza A by PCR NEGATIVE NEGATIVE Final  ? Influenza B by PCR NEGATIVE NEGATIVE Final  ?  Comment: (NOTE) ?The Xpert Xpress SARS-CoV-2/FLU/RSV plus assay is intended as an aid ?in the diagnosis of influenza from Nasopharyngeal swab specimens and ?should not be used as a sole basis for treatment. Nasal washings and ?aspirates are unacceptable for Xpert Xpress SARS-CoV-2/FLU/RSV ?testing. ? ?Fact Sheet for Patients: ?EntrepreneurPulse.com.au ? ?Fact Sheet for Healthcare Providers: ?IncredibleEmployment.be ? ?This test is not yet approved or cleared by the [place] FDA and ?has been authorized for detection and/or diagnosis of SARS-CoV-2 by ?FDA under an Emergency Use Authorization (EUA). This  EUA will remain ?in effect (meaning this test can be used) for the duration of the ?COVID-19 declaration under Section 564(b)(1) of the Act, 21 U.S.C. ?section 360bbb-3(b)(1), unless the authorization is terminated or ?revoked. ? ?Performed at [facility], [place] [address] [place], [place] ?[address] ?  ?  ? ?Radiology Studies: ?No results found. ? ?Scheduled Meds: ? arformoterol  15 mcg Nebulization BID  ? aspirin EC  81 mg Oral Daily  ? budesonide (PULMICORT) nebulizer solution  0.5 mg Nebulization BID  ? chlorhexidine  15 mL Mouth Rinse BID  ? enoxaparin (LOVENOX) injection  40 mg Subcutaneous Q24H  ? guaiFENesin  1,200 mg Oral Q12H  ? ipratropium-albuterol  3 mL Nebulization Q4H  ?  loratadine  10 mg Oral Daily  ? losartan  25 mg Oral Daily  ? magnesium oxide  400 mg Oral Daily  ? mouth rinse  15 mL Mouth Rinse q12n4p  ? methylPREDNISolone (SOLU-MEDROL) injection  120 mg Intravenous Q24H

## 2021-06-29 NOTE — Progress Notes (Addendum)
Mobility Specialist: Progress Note ? ? 06/29/21 1100  ?Mobility  ?Activity Ambulated independently in hallway  ?Level of Assistance Independent after set-up  ?Assistive Device None  ?Distance Ambulated (ft) 240 ft ?(120'x2)  ?Activity Response Tolerated well  ?$Mobility charge 1 Mobility  ? ?Post-Mobility on 2 L/min [place]: 73 HR, 96% SpO2 ? ?Received pt in bed having no complaints and agreeable to mobility. Ambulated on 3 L/min [place]. Stopped x1 for short seated break. Asymptomatic throughout ambulation, returned back to bed w/ call bell in reach and all needs met. ? ?Kim [name] ?Mobility Specialist ?Mobility Specialist [place]: [phone] ?Mobility Specialist [place]: [phone] ? ?

## 2021-06-29 NOTE — Progress Notes (Signed)
Mobility Specialist: Progress Note ? ? 06/29/21 1615  ?Mobility  ?Activity Ambulated independently in hallway  ?Level of Assistance Independent after set-up  ?Assistive Device None  ?Distance Ambulated (ft) 240 ft ?(120'x2)  ?Activity Response Tolerated well  ?$Mobility charge 1 Mobility  ? ?Post-Mobility on 2 L/min [place]: 79 HR, 98% SpO2 ? ?Pt received in the chair and agreeable to ambulation. Ambulated on 3 L/min [place]. Stopped x1 for short seated break with c/o SOB, otherwise asymptomatic. Pt back to recliner after walk with call bell at her side.  ? ?Kim [name] Kim [name] ?Mobility Specialist ?Mobility Specialist [place]: [phone] ?Mobility Specialist [place]: [phone] ? ?

## 2021-06-29 NOTE — Progress Notes (Signed)
Patient declined BIPAP tonight. In room if needed. ?

## 2021-06-29 NOTE — Plan of Care (Signed)

## 2021-06-30 ENCOUNTER — Encounter (HOSPITAL_COMMUNITY): Payer: Self-pay | Admitting: Internal Medicine

## 2021-06-30 DIAGNOSIS — J441 Chronic obstructive pulmonary disease with (acute) exacerbation: Secondary | ICD-10-CM | POA: Diagnosis not present

## 2021-06-30 LAB — BASIC METABOLIC PANEL
Anion gap: 6 (ref 5–15)
BUN: 27 mg/dL — ABNORMAL HIGH (ref 8–23)
CO2: 30 mmol/L (ref 22–32)
Calcium: 9.2 mg/dL (ref 8.9–10.3)
Chloride: 105 mmol/L (ref 98–111)
Creatinine, Ser: 0.8 mg/dL (ref 0.44–1.00)
GFR, Estimated: >60 mL/min (ref >60)
Glucose, Bld: 85 mg/dL (ref 70–99)
Potassium: 4.2 mmol/L (ref 3.5–5.1)
Sodium: 141 mmol/L (ref 135–145)

## 2021-06-30 LAB — CBC WITH DIFFERENTIAL/PLATELET
Abs Immature Granulocytes: 0.84 10*3/uL — ABNORMAL HIGH (ref 0.00–0.07)
Basophils Absolute: 0.1 10*3/uL (ref 0.0–0.1)
Basophils Relative: 0 %
Eosinophils Absolute: 0 10*3/uL (ref 0.0–0.5)
Eosinophils Relative: 0 %
HCT: 36.2 % (ref 36.0–46.0)
Hemoglobin: 11.7 g/dL — ABNORMAL LOW (ref 12.0–15.0)
Immature Granulocytes: 5 %
Lymphocytes Relative: 20 %
Lymphs Abs: 3.3 10*3/uL (ref 0.7–4.0)
MCH: 31.4 pg (ref 26.0–34.0)
MCHC: 32.3 g/dL (ref 30.0–36.0)
MCV: 97.1 fL (ref 80.0–100.0)
Monocytes Absolute: 1.6 10*3/uL — ABNORMAL HIGH (ref 0.1–1.0)
Monocytes Relative: 10 %
Neutro Abs: 10.8 10*3/uL — ABNORMAL HIGH (ref 1.7–7.7)
Neutrophils Relative %: 65 %
Platelets: 358 10*3/uL (ref 150–400)
RBC: 3.73 MIL/uL — ABNORMAL LOW (ref 3.87–5.11)
RDW: 13.5 % (ref 11.5–15.5)
WBC: 16.6 10*3/uL — ABNORMAL HIGH (ref 4.0–10.5)
nRBC: 0 % (ref 0.0–0.2)

## 2021-06-30 MED ORDER — PREDNISONE 10 MG PO TABS: 10.0000 mg | ORAL_TABLET | Freq: Every day | ORAL | 0 refills | Status: DC

## 2021-06-30 NOTE — Progress Notes (Signed)
Mobility Specialist: Progress Note ? ? 06/30/21 1030  ?Mobility  ?Activity Ambulated independently in hallway  ?Level of Assistance Independent after set-up  ?Assistive Device None  ?Distance Ambulated (ft) 240 ft ?(120'x2)  ?Activity Response Tolerated well  ?$Mobility charge 1 Mobility  ? ?Pre-Mobility on 2 L/min [place]: 74 HR, 98% SpO2 ?During Mobility on 3 L/min [place]: 94% SpO2 ?Post-Mobility on 2 L/min [place]: 75 HR, 96% SpO2 ? ?Received pt in bed having no complaints and agreeable to mobility. Ambulated on 3 L/min [place]. Asymptomatic throughout ambulation, returned back to bed w/ call bell in reach and all needs met. ? ?Harrell Gave Mariyana Fulop ?Mobility Specialist ?Mobility Specialist [place]: [phone] ?Mobility Specialist [place]: [phone] ? ?

## 2021-06-30 NOTE — Plan of Care (Signed)
Pt is going home by car with sister and all belongings were returned. ? ?Problem: Education: ?Goal: Knowledge of General Education information will improve ?Description: Including pain rating scale, medication(s)/side effects and non-pharmacologic comfort measures ?Outcome: Adequate for Discharge ?  ?Problem: Health Behavior/Discharge Planning: ?Goal: Ability to manage health-related needs will improve ?Outcome: Adequate for Discharge ?  ?Problem: Clinical Measurements: ?Goal: Ability to maintain clinical measurements within normal limits will improve ?Outcome: Adequate for Discharge ?Goal: Will remain free from infection ?Outcome: Adequate for Discharge ?Goal: Diagnostic test results will improve ?Outcome: Adequate for Discharge ?Goal: Respiratory complications will improve ?Outcome: Adequate for Discharge ?Goal: Cardiovascular complication will be avoided ?Outcome: Adequate for Discharge ?  ?Problem: Activity: ?Goal: Risk for activity intolerance will decrease ?Outcome: Adequate for Discharge ?  ?Problem: Nutrition: ?Goal: Adequate nutrition will be maintained ?Outcome: Adequate for Discharge ?  ?Problem: Coping: ?Goal: Level of anxiety will decrease ?Outcome: Adequate for Discharge ?  ?Problem: Elimination: ?Goal: Will not experience complications related to bowel motility ?Outcome: Adequate for Discharge ?Goal: Will not experience complications related to urinary retention ?Outcome: Adequate for Discharge ?  ?Problem: Pain Managment: ?Goal: General experience of comfort will improve ?Outcome: Adequate for Discharge ?  ?Problem: Safety: ?Goal: Ability to remain free from injury will improve ?Outcome: Adequate for Discharge ?  ?Problem: Skin Integrity: ?Goal: Risk for impaired skin integrity will decrease ?Outcome: Adequate for Discharge ?  ?Problem: Education: ?Goal: Knowledge of disease or condition will improve ?Outcome: Adequate for Discharge ?Goal: Knowledge of the prescribed therapeutic regimen will  improve ?Outcome: Adequate for Discharge ?Goal: Individualized Educational Video(s) ?Outcome: Adequate for Discharge ?  ?Problem: Activity: ?Goal: Ability to tolerate increased activity will improve ?Outcome: Adequate for Discharge ?Goal: Will verbalize the importance of balancing activity with adequate rest periods ?Outcome: Adequate for Discharge ?  ?Problem: Respiratory: ?Goal: Ability to maintain a clear airway will improve ?Outcome: Adequate for Discharge ?Goal: Levels of oxygenation will improve ?Outcome: Adequate for Discharge ?Goal: Ability to maintain adequate ventilation will improve ?Outcome: Adequate for Discharge ?  ?

## 2021-06-30 NOTE — Discharge Summary (Signed)
Physician Discharge Summary  ?Kim [name] XJO:[id number] DOB: 1955/07/29 DOA: 06/26/2021 ? ?PCP: [name], NP ? ?Admit date: 06/26/2021 ?Discharge date: 06/30/2021 ? ?Admitted From: Home ?Disposition:  Home ? ?Discharge Condition:Stable ?CODE STATUS:FULL ?Diet recommendation: Heart Healthy  ? ?Brief/Interim Summary: ?Patient is a 66 year old  female with history of hypertension, hyperlipidemia, COPD on 2 L of oxygen at home, Takotsubo cardiomyopathy, depression/anxiety who presented with worsening cough/shortness of breath, increased sputum production.  She was recently hospitalized on August for COPD exacerbation.  On presentation, she was tachypneic, lab work showed leukocytosis.  Chest x-ray showed hyperinflation without acute process.  COVID/influenza screen test negative.  Patient was admitted for COPD exacerbation.  Prolonged hospital course with persistent wheezing, cough now slowly feeling better.  Medically stable for discharge home today with tapering dose of prednisone. ? ?Following problems were addressed during her hospitalization: ? ? ?COPD exacerbation ([place]) ?Patient presented with complaints of increasing shortness of breath, increased sputum production, and wheezing.  Chest x-ray noting hyperinflation consistent with COPD without acute abnormality appreciated.  Influenza and COVID-19 screening were negative.  Started on IV steroids, azithromycin.  Follow-up chest x-ray did not show pneumonia.  We recommend to continue oral steroids on tapering dose on discharge.  We recommend to follow-up with pulmonology as an outpatient.  Continue bronchodilators, cough medications ? ?Chronic respiratory failure ([place]) ?On 2 L of oxygen at home.  Continue the same. ?  ?Leukocytosis ?Chronic.  WBC elevated in the range of 16. patient reports that she has been on steroids for the last couple of months.  Low suspicious for infectious etiology.  ? ?Anxiety and depression ?Home medication regimen includes Xanax  0.5 mg p.o. 3 times daily as needed and Paxil 30 mg daily.  Continue home regimen as tolerated  ?  ?  ? ?Discharge Diagnoses:  ?Active Problems: ?  COPD exacerbation ([place]) ?  Chronic respiratory failure ([place]) ?  Dyslipidemia ?  Anxiety and depression ?  Leukocytosis ? ? ? ?Discharge Instructions ? ?Discharge Instructions   ? ? Diet - low sodium heart healthy   Complete by: As directed ?  ? Discharge instructions   Complete by: As directed ?  ? 1)Please take prescribed medications as instructed ?2)Follow up with your pulmonologist as an outpatient  ? Increase activity slowly   Complete by: As directed ?  ? ?  ? ?Allergies as of 06/30/2021   ? ?   Reactions  ? Tape Other (See Comments)  ? SKIN TEARS EASILY!!!!  ? Daliresp [roflumilast]   ? Skin peeling  ? Lisinopril Cough  ? Morphine And Related   ? hallucinations  ? Penicillins Hives, Other (See Comments)  ? Tolerates Omnicef ?Has patient had a PCN reaction causing immediate rash, facial/tongue/throat swelling, SOB or lightheadedness with hypotension: Yes ?Has patient had a PCN reaction causing severe rash involving mucus membranes or skin necrosis: No ?Has patient had a PCN reaction that required hospitalization No ?Has patient had a PCN reaction occurring within the last 10 years: No ?If all of the above answers are "NO", then may proceed with Cephalosporin use.  ? Omnicef [cefdinir] Rash  ? ?  ? ?  ?Medication List  ?  ? ?TAKE these medications   ? ?albuterol 108 (90 Base) MCG/ACT inhaler ?Commonly known as: VENTOLIN HFA ?Take two puffs every 4-6 hours as needed for shortness of breath/wheezing . 90 day supply ?  ?ALPRAZolam 0.5 MG tablet ?Commonly known as: Duanne Moron ?TAKE 1/2 TO 1 TABLET  BY MOUTH THREE TIMES DAILY ?What changed: See the new instructions. ?  ?aspirin 81 MG EC tablet ?Take 1 tablet (81 mg total) by mouth daily. ?  ?cetirizine 10 MG tablet ?Commonly known as: ZYRTEC ?Take 10 mg by mouth daily. ?  ?furosemide 40 MG tablet ?Commonly known as: LASIX ?Take  1 tablet (40 mg total) by mouth daily as needed (for an overnight weight gain of 3-5 pounds of fluid). ?What changed: reasons to take this ?  ?HYDROcodone-acetaminophen 7.5-325 MG tablet ?Commonly known as: Norco ?Take 1 tablet by mouth every 6 (six) hours as needed for moderate pain. ?  ?ipratropium-albuterol 0.5-2.5 (3) MG/3ML Soln ?Commonly known as: DUONEB ?USE 3 ML VIA NEBULIZER EVERY 6 HOURS AS NEEDED.  DX: J44.9 ?What changed:  ?how much to take ?how to take this ?when to take this ?reasons to take this ?additional instructions ?  ?losartan 25 MG tablet ?Commonly known as: COZAAR ?Take 1 tablet (25 mg total) by mouth daily. ?  ?magnesium oxide 400 MG tablet ?Commonly known as: MAG-OX ?Take 400 mg by mouth daily. ?  ?metoprolol succinate 25 MG 24 hr tablet ?Commonly known as: TOPROL-XL ?Take 0.5 tablets (12.5 mg total) by mouth daily. ?  ?Mucinex Maximum Strength 1200 MG Tb12 ?Generic drug: Guaifenesin ?Take 1,200 mg by mouth every 12 (twelve) hours. ?  ?nitroGLYCERIN 0.4 MG SL tablet ?Commonly known as: NITROSTAT ?Place 1 tablet (0.4 mg total) under the tongue every 5 (five) minutes as needed for chest pain. ?  ?OXYGEN ?Inhale 2 L/min into the lungs continuous. ?  ?PARoxetine 30 MG tablet ?Commonly known as: PAXIL ?Take 1 tablet (30 mg total) by mouth daily. ?  ?prednisoLONE acetate 1 % ophthalmic suspension ?Commonly known as: PRED FORTE ?Place 1 drop into the right eye daily. ?  ?predniSONE 10 MG tablet ?Commonly known as: DELTASONE ?Take 1 tablet (10 mg total) by mouth daily. Take 4 pills daily for 3 days then 3 pills daily for 3 days then 2 pills daily for 3 days then continue taking 1 pill a day until you follow-up with your pulmonologist ?What changed:  ?medication strength ?how much to take ?when to take this ?additional instructions ?  ?rosuvastatin 20 MG tablet ?Commonly known as: CRESTOR ?Take 20 mg by mouth at bedtime. ?  ?Spacer/Aero [employer] Misc ?1 Device by Does not apply route as  directed. ?  ?Spiriva Respimat 2.5 MCG/ACT Aers ?Generic drug: Tiotropium Bromide Monohydrate ?Inhale 2 puffs into the lungs daily. ?  ?Symbicort 160-4.5 MCG/ACT inhaler ?Generic drug: budesonide-formoterol ?INHALE 2 PUFFS TWICE DAILY ?What changed: how to take this ?  ? ?  ? ? Follow-up Information   ? ? [name], NP. Schedule an appointment as soon as possible for a visit in 1 week(s).   ?Specialty: Family Medicine ?Contact information: ?[place] ?[place] [address] ?[phone] ? ? ?  ?  ? ?  ?  ? ?  ? ?Allergies  ?Allergen Reactions  ? Tape Other (See Comments)  ?  SKIN TEARS EASILY!!!!  ? Daliresp [Roflumilast]   ?  Skin peeling  ? Lisinopril Cough  ? Morphine And Related   ?  hallucinations  ? Penicillins Hives and Other (See Comments)  ?  Tolerates Omnicef ?Has patient had a PCN reaction causing immediate rash, facial/tongue/throat swelling, SOB or lightheadedness with hypotension: Yes ?Has patient had a PCN reaction causing severe rash involving mucus membranes or skin necrosis: No ?Has patient had a PCN reaction that required  hospitalization No ?Has patient had a PCN reaction occurring within the last 10 years: No ?If all of the above answers are "NO", then may proceed with Cephalosporin use. ?  ? Omnicef [Cefdinir] Rash  ? ? ?Consultations: ?None ? ? ?Procedures/Studies: ?DG Chest 2 View ? ?Result Date: 06/26/2021 ?CLINICAL DATA:  Shortness of breath, COPD EXAM: CHEST - 2 VIEW COMPARISON:  CT chest 05/08/2021, chest radiograph 11/15/2020 FINDINGS: The cardiomediastinal silhouette is normal. The lungs hyperinflated with flattening of the diaphragms consistent with underlying COPD. There is no focal consolidation or pulmonary edema. There is no pleural effusion or pneumothorax Compression deformity of 2 midthoracic and 1 lower thoracic vertebral body is unchanged. Cholecystectomy clips are noted. IMPRESSION: COPD.  No radiographic evidence of acute cardiopulmonary process.  Electronically Signed   By: Valetta Mole M.D.   On: 06/26/2021 11:35  ? ?DG CHEST PORT 1 VIEW ? ?Result Date: 06/29/2021 ?CLINICAL DATA:  Shortness of breath. EXAM: PORTABLE CHEST 1 VIEW COMPARISON:  06/26/2021 FINDINGS

## 2021-07-01 ENCOUNTER — Telehealth: Payer: Self-pay

## 2021-07-01 NOTE — Telephone Encounter (Signed)
Transition Care Management Follow-up Telephone Call ?Date of discharge and from where: [place] 06-30-21 Dx: COPD exac  ?How have you been since you were released from the hospital? Doing better  ?Any questions or concerns? No ? ?Items Reviewed: ?Did the pt receive and understand the discharge instructions provided? Yes  ?Medications obtained and verified? Yes  ?Other? No  ?Any new allergies since your discharge? No  ?Dietary orders reviewed? Yes ?Do you have support at home? Yes  ? ?Home Care and Equipment/Supplies: ?Were home health services ordered? no ?If so, what is the name of the agency? na  ?Has the agency set up a time to come to the patient's home? not applicable ?Were any new equipment or medical supplies ordered?  No ?What is the name of the medical supply agency? na ?Were you able to get the supplies/equipment? not applicable ?Do you have any questions related to the use of the equipment or supplies? no ? ?Functional Questionnaire: (I = Independent and D = Dependent) ?ADLs: I ? ?Bathing/Dressing- I ? ?Meal Prep- I ? ?Eating- I ? ?Maintaining continence- I ? ?Transferring/Ambulation- I ? ?Managing Meds- I ? ?Follow up appointments reviewed: ? ?PCP Hospital f/u appt confirmed? Yes  Scheduled to see [name] NP on 07-09-21 @ 2pm. ?[facility] f/u appt confirmed? Yes  Scheduled to see Dr Loanne Drilling on 07-09-21 @ 11am. ?Are transportation arrangements needed? No  ?If their condition worsens, is the pt aware to call PCP or go to the Emergency Dept.? Yes ?Was the patient provided with contact information for the PCP's office or ED? Yes ?Was to pt encouraged to call back with questions or concerns? Yes  ?

## 2021-07-07 NOTE — Procedures (Signed)
ELECTROENCEPHALOGRAM REPORT ? ?Dates of Recording: 06/24/2021 9:26AM to 06/26/2021 9:25AM ? ?Patient's Name: Kim [name] ?MRN: [id number] ?Date of Birth: 1956-02-05 ? ?Referring Provider: Dr. Ellouise Newer ? ?Procedure: 48-hour ambulatory video EEG ? ?History: This is a 66 year old woman with recurrent episodes of dizziness when standing, some with associated blank stare but responsive. She had a syncopal episode in the bathroom on 04/01/21. EEG for classification. ? ?Medications:  ?VENTOLIN HFA 108 (90 Base) MCG/ACT inhaler ?XANAX 0.5 MG tablet ?aspirin EC 81 MG EC tablet ?ZYRTEC 10 MG tablet ?LASIX 40 MG tablet ?NORCO 7.5-325 MG tablet ?DUONEB 0.5-2.5 (3) MG/3ML SOLN ?COZAAR 50 MG tablet ?MAG-OX 400 MG tablet ?TOPROL-XL 25 MG 24 hr tablet ?MUCINEX MAXIMUM STRENGTH 1200 MG TB12 ?NITROSTAT 0.4 MG SL tablet ?MYCOSTATIN 100000 UNIT/ML suspension ?PAXIL 30 MG tablet ?DELTASONE 10 MG tablet ?SYMBICORT 160-4.5 MCG/ACT inhaler ?SPIRIVA RESPIMAT 2.5 MCG/ACT AERS ? ?Technical Summary: ?This is a 48-hour multichannel digital video EEG recording measured by the international 10-20 system with electrodes applied with paste and impedances below 5000 ohms performed as portable with EKG monitoring.  The digital EEG was referentially recorded, reformatted, and digitally filtered in a variety of bipolar and referential montages for optimal display.   ? ?DESCRIPTION OF RECORDING: ?During maximal wakefulness, the background activity consisted of a symmetric 10-11 Hz posterior dominant rhythm which was reactive to eye opening.  There were no epileptiform discharges or focal slowing seen in wakefulness. ? ?During the recording, the patient progresses through wakefulness, drowsiness, and Stage 2 sleep.  Again, there were no epileptiform discharges seen. ? ?Events: ?On 3/20 at 1045 hours, she came up steps and could not breathe. Patient not on video. Electrographically, there were no EEG or EKG changes seen. ? ?On 3/20 at 1505 hours, she  felt it hard to breathe. She is sitting on a chair breathing heavily. Electrographically, there were no EEG or EKG changes seen. ? ?On 3/20 at 1610 hours, she stood up a little lightheaded.She is standing, talking, no clinical changes seen. Electrographically, there were no EEG or EKG changes seen. ? ?On 3/21 at 0804 hours, she has a headache. She is lying on bed with no clinical changes seen. Electrographically, there were no EEG or EKG changes seen. ? ?On 3/22 at 0702 hours, BP was elevated at 187/104 with headache and hard to breathe. She is sitting up on bed, no clinical changes seen. Electrographically, there were no EEG or EKG changes seen. ? ?There were no electrographic seizures seen.  EKG lead was unremarkable. ? ?IMPRESSION: This 48-hour ambulatory video EEG study is normal.   ? ?CLINICAL CORRELATION: A normal EEG does not exclude a clinical diagnosis of epilepsy. Typical events were not captured. Episodes of shortness of breath, lightheadedness, headache, did not show EEG correlate.  If further clinical questions remain, inpatient video EEG monitoring may be helpful. ? ? ?Ellouise Newer, M.D. ? ?

## 2021-07-08 ENCOUNTER — Telehealth: Payer: Self-pay

## 2021-07-08 ENCOUNTER — Other Ambulatory Visit: Payer: Self-pay | Admitting: Adult Health

## 2021-07-08 DIAGNOSIS — F419 Anxiety disorder, unspecified: Secondary | ICD-10-CM

## 2021-07-08 DIAGNOSIS — F32A Depression, unspecified: Secondary | ICD-10-CM

## 2021-07-08 NOTE — Telephone Encounter (Signed)
-----   Message from Cameron Sprang, MD sent at 07/08/2021 11:48 AM EDT ----- ?Pls let her know that the brain wave test was normal. There is no clear neurological cause for the dizziness and passing out, mostly likely due to drop in blood pressure. Continue f/u with PCP and cardiology. thanks ?

## 2021-07-08 NOTE — Telephone Encounter (Signed)
Pt called an informed that the brain wave test was normal. There is no clear neurological cause for the dizziness and passing out, mostly likely due to drop in blood pressure. Continue f/u with PCP and cardiology. Pt has an appointment with PCP tomorrow  ?

## 2021-07-09 ENCOUNTER — Encounter: Payer: Self-pay | Admitting: Adult Health

## 2021-07-09 ENCOUNTER — Encounter: Payer: Self-pay | Admitting: Pulmonary Disease

## 2021-07-09 ENCOUNTER — Ambulatory Visit (INDEPENDENT_AMBULATORY_CARE_PROVIDER_SITE_OTHER): Payer: Medicare Other | Admitting: Adult Health

## 2021-07-09 ENCOUNTER — Ambulatory Visit (INDEPENDENT_AMBULATORY_CARE_PROVIDER_SITE_OTHER): Payer: Medicare Other | Admitting: Pulmonary Disease

## 2021-07-09 VITALS — BP 120/70 | HR 86 | Temp 98.3°F | Wt 138.0 lb

## 2021-07-09 DIAGNOSIS — J9611 Chronic respiratory failure with hypoxia: Secondary | ICD-10-CM

## 2021-07-09 DIAGNOSIS — J449 Chronic obstructive pulmonary disease, unspecified: Secondary | ICD-10-CM

## 2021-07-09 DIAGNOSIS — F32A Depression, unspecified: Secondary | ICD-10-CM

## 2021-07-09 DIAGNOSIS — D72829 Elevated white blood cell count, unspecified: Secondary | ICD-10-CM

## 2021-07-09 DIAGNOSIS — F419 Anxiety disorder, unspecified: Secondary | ICD-10-CM

## 2021-07-09 DIAGNOSIS — J441 Chronic obstructive pulmonary disease with (acute) exacerbation: Secondary | ICD-10-CM

## 2021-07-09 MED ORDER — PREDNISONE 5 MG PO TABS: 5.0000 mg | ORAL_TABLET | Freq: Every day | ORAL | 1 refills | Status: DC

## 2021-07-09 MED ORDER — IPRATROPIUM-ALBUTEROL 0.5-2.5 (3) MG/3ML IN SOLN: RESPIRATORY_TRACT | 5 refills | Status: DC

## 2021-07-09 NOTE — Patient Instructions (Signed)
COPD ?--Continue current inhalers. Refilled Duonebs ?--Continue 2-3L activity and sleep ?--Encourage regular activity ? ?Follow-up with Dr. Elsworth Soho ?

## 2021-07-09 NOTE — Progress Notes (Signed)
? ?Subjective:  ? ? Patient ID: Kim [name], female    DOB: November 09, 1955, 66 y.o.   MRN: [id number] ? ?HPI ?  ?66 year old female who  has a past medical history of C8 RADICULOPATHY (06/05/2009), COPD (08/31/2007), DEPRESSION (11/19/2006), DYSLIPIDEMIA (03/12/2009), MI (mitral incompetence), NEPHROLITHIASIS (01/05/2008), OSTEOARTHRITIS (06/05/2009), PARESTHESIA (08/24/2007), and TOBACCO ABUSE (01/25/2010). ? ?She presents to the office today for TCM visit  ? ?Admit Date 06/26/2021 ?Discharge Date 06/30/2021 ? ?She presented to the hospital with worsening cough and shortness of breath and increased sputum production.  She was recently hospitalized in August for COPD exacerbation.  When she presented to the emergency room she was tachypneic and lab work showed leukocytosis.  Her chest x-ray showed hyperinflation without acute process.  Her COVID/influenza screen test was negative.  She was admitted for COPD exacerbation. ? ?Hospital Course ? ?COPD exacerbation ?-She was started on IV steroids and azithromycin.  She was discharged on tapering dose of oral steroids.  Recommended following up with pulmonary as outpatient ? ?Chronic respiratory failure ?-On 2 L oxygen at home.  Advised to continue the same ? ?Leukocytosis ?-Chronic.  WBC elevated in the range of 16.  She had been on steroids for the last few months.  There was a low suspicion for infectious etiology ? ?Anxiety ?-Home medication regimen includes Xanax 0.5 mg 3 times daily as needed and Paxil 30 mg daily.  Home regimen was continued ? ?She was seen by Pulmonary earlier today for follow up.she reports that she is back to her baseline for the most part but continues to feel tired.  ? ? ?Review of Systems  ?Constitutional:  Positive for fatigue.  ?Respiratory:  Positive for shortness of breath.   ?Cardiovascular: Negative.   ?Genitourinary: Negative.   ?Musculoskeletal: Negative.   ?Neurological: Negative.   ?Hematological: Negative.   ?All other systems reviewed and  are negative. ? ? ?Past Medical History:  ?Diagnosis Date  ? C8 RADICULOPATHY 06/05/2009  ? COPD 08/31/2007  ? DEPRESSION 11/19/2006  ? DYSLIPIDEMIA 03/12/2009  ? MI (mitral incompetence)   ? NEPHROLITHIASIS 01/05/2008  ? OSTEOARTHRITIS 06/05/2009  ? PARESTHESIA 08/24/2007  ? TOBACCO ABUSE 01/25/2010  ? ? ?Social History  ? ?Socioeconomic History  ? Marital status: Married  ?  Spouse name: [name]  ? Number of children: 2  ? Years of education: Not on file  ? Highest education level: Not on file  ?Occupational History  ? Occupation: Patient care aide-sits with elderly lady  ?Tobacco Use  ? Smoking status: Former  ?  Packs/day: 2.00  ?  Years: 38.00  ?  Pack years: 76.00  ?  Types: Cigarettes  ?  Quit date: 03/07/2014  ?  Years since quitting: 7.3  ? Smokeless tobacco: Never  ?Vaping Use  ? Vaping Use: Never used  ?Substance and Sexual Activity  ? Alcohol use: No  ?  Alcohol/week: 0.0 standard drinks  ? Drug use: No  ? Sexual activity: Not on file  ?Other Topics Concern  ? Not on file  ?Social History Narrative  ? [facility] (01/12/17):  ? Originally from [facility]. Has lived in [place] for 17 years. Married. Has 2 dogs currently. No mold and only 1 indoor plant. Had her home professionally cleaned a month ago. No bird or hot tub exposure. 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   ? Right handed   ? ?Social Determinants of Health  ? ?Financial Resource Strain: Low Risk   ?  Difficulty of Paying Living Expenses: Not hard at all  ?Food Insecurity: No Food Insecurity  ? Worried About Charity fundraiser in the Last Year: Never true  ? Ran Out of Food in the Last Year: Never true  ?Transportation Needs: No Transportation Needs  ? Lack of Transportation (Medical): No  ? Lack of Transportation (Non-Medical): No  ?Physical Activity: Inactive  ? Days of Exercise per Week: 0 days  ? Minutes of Exercise per Session: 0 min  ?Stress: No Stress Concern Present  ? Feeling of Stress : Not at all  ?Social Connections:  Moderately Isolated  ? Frequency of Communication with Friends and Family: More than three times a week  ? Frequency of Social Gatherings with Friends and Family: More than three times a week  ? Attends Religious Services: Never  ? Active Member of Clubs or Organizations: No  ? Attends Archivist Meetings: Never  ? Marital Status: Married  ?Intimate Partner Violence: Not At Risk  ? Fear of Current or Ex-Partner: No  ? Emotionally Abused: No  ? Physically Abused: No  ? Sexually Abused: No  ? ? ?Past Surgical History:  ?Procedure Laterality Date  ? ABDOMINAL HYSTERECTOMY    ? APPENDECTOMY  1969  ? CARDIAC CATHETERIZATION N/A 07/05/2015  ? Procedure: Left Heart Cath and Coronary Angiography;  Surgeon: Leonie Man, MD;  Location: [facility];  Service: Cardiovascular;  Laterality: N/A;  ? CHOLECYSTECTOMY  1989  ? collapse lung  1990  ? COLONOSCOPY WITH PROPOFOL N/A 05/14/2018  ? Procedure: COLONOSCOPY WITH PROPOFOL;  Surgeon: Irene Shipper, MD;  Location: [facility];  Service: Endoscopy;  Laterality: N/A;  ? EXCISION MASS ABDOMINAL  2019  ? EXPLORATORY LAPAROTOMY  1969  ? LEFT HEART CATH AND CORONARY ANGIOGRAPHY N/A 11/19/2020  ? Procedure: LEFT HEART CATH AND CORONARY ANGIOGRAPHY;  Surgeon: [name], MD;  Location: [facility];  Service: Cardiovascular;  Laterality: N/A;  ? POLYPECTOMY  05/14/2018  ? Procedure: POLYPECTOMY;  Surgeon: Irene Shipper, MD;  Location: [facility];  Service: Endoscopy;;  ? SALPINGOOPHORECTOMY  2019  ? ? ?Family History  ?Problem Relation Age of Onset  ? Cancer Mother   ?     lung  ? Heart attack Mother   ? Hypertension Sister   ? Multiple sclerosis Brother   ? Diabetes Sister   ? Rheum arthritis Sister   ? Thyroid disease Sister   ? Multiple sclerosis Other   ? Alcohol abuse Other   ? Arthritis Other   ? Diabetes Other   ? Kidney disease Other   ? Cancer Other   ?     lung,ovarian,skin, uterine  ? Stroke Other   ? Heart disease Other   ? Melanoma Other   ?  Osteoporosis Other   ? Heart attack Maternal Uncle   ? Heart attack Other   ?     NEICE  ? Heart attack Other   ?     NEPHEW  ? Hypertension Brother   ? Stroke Maternal Grandmother   ? Colon cancer Neg Hx   ? ? ?Allergies  ?Allergen Reactions  ? Tape Other (See Comments)  ?  SKIN TEARS EASILY!!!!  ? Daliresp [Roflumilast]   ?  Skin peeling  ? Lisinopril Cough  ? Morphine And Related   ?  hallucinations  ? Penicillins Hives and Other (See Comments)  ?  Tolerates Omnicef ?Has patient had a PCN reaction causing immediate rash, facial/tongue/throat  swelling, SOB or lightheadedness with hypotension: Yes ?Has patient had a PCN reaction causing severe rash involving mucus membranes or skin necrosis: No ?Has patient had a PCN reaction that required hospitalization No ?Has patient had a PCN reaction occurring within the last 10 years: No ?If all of the above answers are "NO", then may proceed with Cephalosporin use. ?  ? Omnicef [Cefdinir] Rash  ? ? ?Current Outpatient Medications on File Prior to Visit  ?Medication Sig Dispense Refill  ? albuterol (VENTOLIN HFA) 108 (90 Base) MCG/ACT inhaler Take two puffs every 4-6 hours as needed for shortness of breath/wheezing . 90 day supply 162 g 1  ? ALPRAZolam (XANAX) 0.5 MG tablet TAKE 1/2 TO 1 TABLET BY MOUTH THREE TIMES DAILY (Patient taking differently: Take 0.5 mg by mouth 3 (three) times daily as needed for anxiety.) 60 tablet 2  ? aspirin EC 81 MG EC tablet Take 1 tablet (81 mg total) by mouth daily.    ? cetirizine (ZYRTEC) 10 MG tablet Take 10 mg by mouth daily.    ? furosemide (LASIX) 40 MG tablet Take 1 tablet (40 mg total) by mouth daily as needed (for an overnight weight gain of 3-5 pounds of fluid). (Patient taking differently: Take 40 mg by mouth daily as needed (overnight weight gain of 3-5 pounds of fluid).)    ? HYDROcodone-acetaminophen (NORCO) 7.5-325 MG tablet Take 1 tablet by mouth every 6 (six) hours as needed for moderate pain. 45 tablet 0  ?  ipratropium-albuterol (DUONEB) 0.5-2.5 (3) MG/3ML SOLN USE 3 ML VIA NEBULIZER EVERY 6 HOURS AS NEEDED.  DX: J44.9 360 mL 5  ? losartan (COZAAR) 25 MG tablet Take 1 tablet (25 mg total) by mouth daily. 90 tablet 3  ? magn

## 2021-07-09 NOTE — Progress Notes (Signed)
? ? ?Subjective:  ? ?PATIENT ID: Kim [name] GENDER: female DOB: 1955-10-15, MRN: [id number] ? ? ?HPI ? ?Chief Complaint  ?Patient presents with  ? Hospitalization Follow-up  ?  Suppose to visit Dr. Loanne [name] with Osteoporosis and needs '5mg'$  of prednisone   ? ? ?Reason for Visit: New consult for hospital follow-up ? ?[name] is a 66 year old female with COPD, chronic respiratory failure, HTN, HLD, hx Takotsubo CM, anxiety and depression who presents for hospital follow-up. ? ?Discharge summary reviewed 3/22-3/26. Admitted for COPD exacerbation. Treated with azithromycin, steroids. CXR no pneumonia. Discharged on home 2L O2. While in the hospital she used NIV and she reports good quality of sleep and breathing. ? ?She is followed by Dr. Elsworth [name] for COPD. She is steroid dependent and on weaning to prednisone 5 mg daily. Needs refill on steroids. Has been using her IS and flutter valve daily. She is compliant with her Symbicort BID and Duonebs TID. Has Spiriva but only uses it when she uses her Duonebs less.  ? ?I have personally reviewed patient's past medical/family/social history, allergies, current medications. ? ?Past Medical History:  ?Diagnosis Date  ? C8 RADICULOPATHY 06/05/2009  ? COPD 08/31/2007  ? DEPRESSION 11/19/2006  ? DYSLIPIDEMIA 03/12/2009  ? MI (mitral incompetence)   ? NEPHROLITHIASIS 01/05/2008  ? OSTEOARTHRITIS 06/05/2009  ? PARESTHESIA 08/24/2007  ? TOBACCO ABUSE 01/25/2010  ?  ? ?Family History  ?Problem Relation Age of Onset  ? Cancer Mother   ?     lung  ? Heart attack Mother   ? Hypertension Sister   ? Multiple sclerosis Brother   ? Diabetes Sister   ? Rheum arthritis Sister   ? Thyroid disease Sister   ? Multiple sclerosis Other   ? Alcohol abuse Other   ? Arthritis Other   ? Diabetes Other   ? Kidney disease Other   ? Cancer Other   ?     lung,ovarian,skin, uterine  ? Stroke Other   ? Heart disease Other   ? Melanoma Other   ? Osteoporosis Other   ? Heart attack Maternal Uncle   ? Heart  attack Other   ?     NEICE  ? Heart attack Other   ?     NEPHEW  ? Hypertension Brother   ? Stroke Maternal Grandmother   ? Colon cancer Neg Hx   ?  ? ?Social History  ? ?Occupational History  ? Occupation: Patient care aide-sits with elderly lady  ?Tobacco Use  ? Smoking status: Former  ?  Packs/day: 2.00  ?  Years: 38.00  ?  Pack years: 76.00  ?  Types: Cigarettes  ?  Quit date: 03/07/2014  ?  Years since quitting: 7.3  ? Smokeless tobacco: Never  ?Vaping Use  ? Vaping Use: Never used  ?Substance and Sexual Activity  ? Alcohol use: No  ?  Alcohol/week: 0.0 standard drinks  ? Drug use: No  ? Sexual activity: Not on file  ? ? ?Allergies  ?Allergen Reactions  ? Tape Other (See Comments)  ?  SKIN TEARS EASILY!!!!  ? Daliresp [Roflumilast]   ?  Skin peeling  ? Lisinopril Cough  ? Morphine And Related   ?  hallucinations  ? Penicillins Hives and Other (See Comments)  ?  Tolerates Omnicef ?Has patient had a PCN reaction causing immediate rash, facial/tongue/throat swelling, SOB or lightheadedness with hypotension: Yes ?Has patient had a PCN reaction causing severe rash involving mucus membranes or  skin necrosis: No ?Has patient had a PCN reaction that required hospitalization No ?Has patient had a PCN reaction occurring within the last 10 years: No ?If all of the above answers are "NO", then may proceed with Cephalosporin use. ?  ? Omnicef [Cefdinir] Rash  ?  ? ?Outpatient Medications Prior to Visit  ?Medication Sig Dispense Refill  ? albuterol (VENTOLIN HFA) 108 (90 Base) MCG/ACT inhaler Take two puffs every 4-6 hours as needed for shortness of breath/wheezing . 90 day supply 162 g 1  ? ALPRAZolam (XANAX) 0.5 MG tablet TAKE 1/2 TO 1 TABLET BY MOUTH THREE TIMES DAILY (Patient taking differently: Take 0.5 mg by mouth 3 (three) times daily as needed for anxiety.) 60 tablet 2  ? aspirin EC 81 MG EC tablet Take 1 tablet (81 mg total) by mouth daily.    ? cetirizine (ZYRTEC) 10 MG tablet Take 10 mg by mouth daily.    ?  furosemide (LASIX) 40 MG tablet Take 1 tablet (40 mg total) by mouth daily as needed (for an overnight weight gain of 3-5 pounds of fluid). (Patient taking differently: Take 40 mg by mouth daily as needed (overnight weight gain of 3-5 pounds of fluid).)    ? HYDROcodone-acetaminophen (NORCO) 7.5-325 MG tablet Take 1 tablet by mouth every 6 (six) hours as needed for moderate pain. 45 tablet 0  ? losartan (COZAAR) 25 MG tablet Take 1 tablet (25 mg total) by mouth daily. 90 tablet 3  ? magnesium oxide (MAG-OX) 400 MG tablet Take 400 mg by mouth daily.    ? metoprolol succinate (TOPROL-XL) 25 MG 24 hr tablet Take 0.5 tablets (12.5 mg total) by mouth daily. 45 tablet 3  ? MUCINEX MAXIMUM STRENGTH 1200 MG TB12 Take 1,200 mg by mouth every 12 (twelve) hours.    ? nitroGLYCERIN (NITROSTAT) 0.4 MG SL tablet Place 1 tablet (0.4 mg total) under the tongue every 5 (five) minutes as needed for chest pain. 25 tablet 3  ? OXYGEN Inhale 2 L/min into the lungs continuous.    ? PARoxetine (PAXIL) 30 MG tablet Take 1 tablet (30 mg total) by mouth daily. 90 tablet 1  ? prednisoLONE acetate (PRED FORTE) 1 % ophthalmic suspension Place 1 drop into the right eye daily.    ? predniSONE (DELTASONE) 10 MG tablet Take 1 tablet (10 mg total) by mouth daily. Take 4 pills daily for 3 days then 3 pills daily for 3 days then 2 pills daily for 3 days then continue taking 1 pill a day until you follow-up with your pulmonologist 45 tablet 0  ? rosuvastatin (CRESTOR) 20 MG tablet Take 20 mg by mouth at bedtime.    ? Spacer/Aero Chamber Mouthpiece MISC 1 Device by Does not apply route as directed. 1 each 0  ? SYMBICORT 160-4.5 MCG/ACT inhaler INHALE 2 PUFFS TWICE DAILY (Patient taking differently: 2 puffs 2 (two) times daily.) 30 g 3  ? Tiotropium Bromide Monohydrate (SPIRIVA RESPIMAT) 2.5 MCG/ACT AERS Inhale 2 puffs into the lungs daily. 4 g 5  ? ipratropium-albuterol (DUONEB) 0.5-2.5 (3) MG/3ML SOLN USE 3 ML VIA NEBULIZER EVERY 6 HOURS AS NEEDED.   DX: J44.9 (Patient taking differently: Take 3 mLs by nebulization every 6 (six) hours as needed (cough).) 360 mL 5  ? ?No facility-administered medications prior to visit.  ? ? ?Review of Systems  ?Constitutional:  Negative for chills, diaphoresis, fever, malaise/fatigue and weight loss.  ?HENT:  Negative for congestion.   ?Respiratory:  Positive for shortness of breath.  Negative for cough, hemoptysis, sputum production and wheezing.   ?Cardiovascular:  Negative for chest pain, palpitations and leg swelling.  ? ? ?Objective:  ? ?Vitals:  ? 07/09/21 1105  ?BP: 122/64  ?Pulse: 80  ?Temp: 98.3 ?F (36.8 ?C)  ?TempSrc: Oral  ?SpO2: 95%  ?Weight: 139 lb (63 kg)  ?Height: '5\' 2"'$  (1.575 m)  ? ?SpO2: 95 % (2L) ?O2 Device: Nasal cannula ?O2 Flow Rate (L/min): 2 L/min ?O2 Type: Pulse O2 ? ?Physical Exam: ?General: Well-appearing, no acute distress ?HENT: [place], AT ?Eyes: EOMI, no scleral icterus ?Respiratory: Clear to auscultation bilaterally.  No crackles, wheezing or rales ?Cardiovascular: RRR, -M/R/G, no JVD ?Extremities:-Edema,-tenderness ?Neuro: AAO x4, CNII-XII grossly intact ?Psych: Normal mood, normal affect ? ?Data Reviewed: ? ?Imaging: ?CXR 06/29/21 - no active cardiac or pulmonary dz ? ?   ?Assessment & Plan:  ? ?Discussion: ?66 year old female with steroid dependent COPD, chronic respiratory failure, HTN, HLD, hx Takotsubo CM, anxiety and depression who presents for hospital follow-up. Overall doing well and and stable. On steroid taper for COPD and needing refills ? ?COPD ?--Continue current inhalers. Refilled Duonebs ?--Refilled steroids ?--Continue 2-3L activity and sleep ?--Encourage regular activity ?--Consider evaluating for NIV as patient reports benefit ? ?Health Maintenance ?Immunization History  ?Administered Date(s) Administered  ? Influenza Split 01/31/2011, 01/27/2012, 03/25/2014  ? Influenza Whole 01/05/2008, 03/12/2009, 03/13/2010  ? Influenza,inj,Quad PF,6+ Mos 12/29/2012, 12/20/2014, 01/07/2017,  12/17/2017, 12/17/2018, 01/09/2020  ? Influenza-Unspecified 01/31/2011, 01/27/2012, 12/29/2012, 03/25/2014, 12/20/2014, 02/11/2016, 01/07/2017, 12/17/2017, 12/17/2018  ? PFIZER(Purple Top)SARS-COV-2 Vaccination 06/29/2019

## 2021-07-10 NOTE — Telephone Encounter (Signed)
Okay for refill?  ? ? ?LOV 07/09/2021 ? ? ?ALPRAZolam (XANAX) 0.5 MG tablet 60 tablet 2 03/05/2021   ?Sig:   TAKE 1/2 TO 1 TABLET BY MOUTH THREE TIMES DAILY      ?Patient taking differently:   Take 0.5 mg by mouth 3 (three) times daily as needed for anxiety.      ? ?

## 2021-07-26 ENCOUNTER — Other Ambulatory Visit: Payer: Self-pay | Admitting: Primary Care

## 2021-08-08 ENCOUNTER — Encounter: Payer: Self-pay | Admitting: Neurology

## 2021-08-08 ENCOUNTER — Ambulatory Visit (INDEPENDENT_AMBULATORY_CARE_PROVIDER_SITE_OTHER): Payer: Medicare Other | Admitting: Neurology

## 2021-08-08 VITALS — BP 100/61 | HR 97 | Ht 62.5 in | Wt 140.4 lb

## 2021-08-08 DIAGNOSIS — R42 Dizziness and giddiness: Secondary | ICD-10-CM | POA: Diagnosis not present

## 2021-08-08 DIAGNOSIS — R55 Syncope and collapse: Secondary | ICD-10-CM

## 2021-08-08 NOTE — Progress Notes (Signed)
? ?NEUROLOGY FOLLOW UP OFFICE NOTE ? ?Kim [name] ?[id number] ?[birth year] ? ?HISTORY OF PRESENT ILLNESS: ?I had the pleasure of seeing Kim [name] in follow-up in the neurology clinic on 08/08/2021.  The patient was last seen 4 months ago for episodes of dizziness, loss of consciousness in 03/2021. She is again accompanied by her husband who helps supplement the history today. Records and images were personally reviewed where available.  I personally reviewed brain MRI with and without contrast done 05/2021 which was unremarkable, no acute changes, there was mild to moderate chronic microvascular disease. Her 1-hour and 48-hour EEG were normal. She had shortness of breath, lightheadedness, headache with no EEG correlate seen. She presents today with a list of concerns, asking about the vagus nerve. She has cramps in her neck. A lot of times she cannot swallow, she works up some saliva and has to drink water to swallow, no choking. Sometimes voice is hoarse and she cannot talk, she wonders if Symbicort use is related. She has a lot of numbness on her left arm. Her neck hurts a lot. She has coughing and wheezing, also has COPD. She has a lot of stomach pain, she had ovarian surgery in 2019 but since then her stomach hurst daily but she does not complain. Her heart rate is always fast (HR 97 today), she has diaphoresis that appears out of nowhere, "not like hot flashes." She has a history of a thoracic compression fracture that occurred after she bent down in 2021. She has been having headaches in the back of her head, sometimes in the temples, which is new for her, no prior history of headaches. She continues to have the dizzy episodes when standing, she would start to walk and her upper body would be jerking. Whatever she is holding in her hand would drop. She cannot talk but can see and hear, she sits down and feels better. She reports a lot of anxiety.  ? ? ?History on Initial Assessment 04/19/2021: This is a 66  year old right-handed woman with a history of hyperlipidemia, nephrolithiasis, COPD, radiculopathy, depression, presenting for evaluation of seizure-like activity. She had an episode of loss of consciousness on 04/01/21, she was walking to the bathroom then woke up on the floor. She has had a lot of dizzy spells that started in 05/2020, they were waiting their turn for taxes then "everything went," she felt lightheaded and slid down the floor with no loss of consciousness. 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 They have been increasing in frequency, she had had 3 in the past 3-4 weeks where she would let herself down on the floor. She states she cannot talk but can understand people around her. Her husband notes she has a blank look with most of the episodes. They all occur when she first gets up, no episodes when supine. On 04/01/21, she recalls needing to use the bathroom, as she got to the bathroom door, she felt the same sensation, turned the lights on, then woke up on the floor. Her husband reports she hollered for him and found her on the floor with urinary incontinence, she was awake when he arrived. She states there is 1-2 seconds she does not remember, but with the other episode she is quite aware of what happened. Her husband has not witnessed any convulsive activity.  She states she was on several medications that may have  affected her (citing Losartan, Mucinex, Zyrtec, Lasix). When she looks up, she briefly feels lightheaded. She gets lightheaded every 2 days but states she has not lost consciousness since 04/01/21. In the past, her BP would be 70-80/40, then back up as soon as it is over. She reports having labile BP but current medication combination has helped with this. She denies any olfactory/gustatory hallucinations, myoclonic jerks.  ? ?She has rare headaches, however since December she has had more headaches that  she attributes to her hospitalization in December for COPD where she had headaches the entire time, she states it occurred after her Pneumovax shot. Since then, she has had 1-2 headaches a month. For the past 2-3 months, her left eye is a "complete fog," with a big blur on her vision. She has neck pain and left arm gets numb. She denies any dysarthria/dysphagia, back pain, bowel/bladder dysfunction. She has a lot of hand tremors. She has palpitations and sees Cardiology, but has not alerted them of her syncopal episode. Sleep is good. A maternal first cousin has epilepsy. Her son had epilepsy for 3 years treated with Dilantin. Otherwise she had a normal birth and early development.  There is no history of febrile convulsions, CNS infections such as meningitis/encephalitis, significant traumatic brain injury, neurosurgical procedures ? 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 ? ? ?PAST MEDICAL HISTORY: ?Past Medical History:  ?Diagnosis Date  ? C8 RADICULOPATHY 06/05/2009  ? COPD 08/31/2007  ? DEPRESSION 11/19/2006  ? DYSLIPIDEMIA 03/12/2009  ? MI (mitral incompetence)   ? NEPHROLITHIASIS 01/05/2008  ? OSTEOARTHRITIS 06/05/2009  ? PARESTHESIA 08/24/2007  ? TOBACCO ABUSE 01/25/2010  ? ? ?MEDICATIONS: ?Current Outpatient Medications on File Prior to Visit  ?Medication Sig Dispense Refill  ? albuterol (VENTOLIN HFA) 108 (90 Base) MCG/ACT inhaler INHALE TWO PUFFS INTO LUNGS EVERY 4 TO 6 HOURS AS NEEDED FOR SHORTNESS OF BREATH/WHEEZING. 18 g 1  ? ALPRAZolam (XANAX) 0.5 MG tablet Take 1 tablet (0.5 mg total) by mouth 3 (three) times daily as needed for anxiety. 60 tablet 2  ? aspirin EC 81 MG EC tablet Take 1 tablet (81 mg total) by mouth daily.    ? cetirizine (ZYRTEC) 10 MG tablet Take 10 mg by mouth daily.    ? furosemide (LASIX) 40 MG tablet Take 1 tablet (40 mg total) by mouth daily as needed (for an overnight weight gain of 3-5 pounds  of fluid). (Patient taking differently: Take 40 mg by mouth daily as needed (overnight weight gain of 3-5 pounds of fluid).)    ? HYDROcodone-acetaminophen (NORCO) 7.5-325 MG tablet Take 1 tablet by mouth every 6 (six) hours as needed for moderate pain. 45 tablet 0  ? ipratropium-albuterol (DUONEB) 0.5-2.5 (3) MG/3ML SOLN USE 3 ML VIA NEBULIZER EVERY 6 HOURS AS NEEDED.  DX: J44.9 360 mL 5  ? losartan (COZAAR) 25 MG tablet Take 1 tablet (25 mg total) by mouth daily. 90 tablet 3  ? magnesium oxide (MAG-OX) 400 MG tablet Take 400 mg by mouth daily.    ? metoprolol succinate (TOPROL-XL) 25 MG 24 hr tablet Take 0.5 tablets (12.5 mg total) by mouth daily. 45 tablet 3  ? MUCINEX MAXIMUM STRENGTH 1200 MG TB12 Take 1,200 mg by mouth every 12 (twelve) hours.    ? nitroGLYCERIN (NITROSTAT) 0.4 MG SL tablet Place 1 tablet (0.4 mg total) under the tongue every 5 (five) minutes as needed for chest pain. 25 tablet 3  ?  OXYGEN Inhale 2 L/min into the lungs continuous.    ? PARoxetine (PAXIL) 30 MG tablet Take 1 tablet (30 mg total) by mouth daily. 90 tablet 1  ? Potassium 99 MG TABS Take 1 tablet by mouth as needed.    ? predniSONE (DELTASONE) 5 MG tablet Take 1 tablet (5 mg total) by mouth daily with breakfast. 60 tablet 1  ? rosuvastatin (CRESTOR) 20 MG tablet Take 20 mg by mouth at bedtime.    ? Spacer/Aero Chamber Mouthpiece MISC 1 Device by Does not apply route as directed. 1 each 0  ? SYMBICORT 160-4.5 MCG/ACT inhaler INHALE 2 PUFFS TWICE DAILY (Patient taking differently: 2 puffs 2 (two) times daily.) 30 g 3  ? Tiotropium Bromide Monohydrate (SPIRIVA RESPIMAT) 2.5 MCG/ACT AERS Inhale 2 puffs into the lungs daily. 4 g 5  ? predniSONE (DELTASONE) 10 MG tablet Take 1 tablet (10 mg total) by mouth daily. Take 4 pills daily for 3 days then 3 pills daily for 3 days then 2 pills daily for 3 days then continue taking 1 pill a day until you follow-up with your pulmonologist (Patient not taking: Reported on 08/08/2021) 45 tablet 0   ? ?No current facility-administered medications on file prior to visit.  ? ? ?ALLERGIES: ?Allergies  ?Allergen Reactions  ? Tape Other (See Comments)  ?  SKIN TEARS EASILY!!!!  ? Daliresp [Roflumilast]

## 2021-08-08 NOTE — Patient Instructions (Signed)
Good to see you. ? ?Start using an abdominal binder or thigh/waist-high compression stockings ? ?2. Increase hydration, can liberalize salt intake if okay with Cardiology ? ?3. Discuss neck pain/swallowing/abdominal pain with PCP ? ?4. Discuss dizziness, low BP, fast heart rate, sweating with Cardiology ? ?5. Follow-up as needed, call for any changes  ?

## 2021-08-16 ENCOUNTER — Other Ambulatory Visit: Payer: Self-pay | Admitting: Cardiovascular Disease

## 2021-08-16 MED ORDER — FUROSEMIDE 40 MG PO TABS: 40.0000 mg | ORAL_TABLET | Freq: Every day | ORAL | 1 refills | Status: DC | PRN

## 2021-08-20 ENCOUNTER — Encounter: Payer: Self-pay | Admitting: Endocrinology

## 2021-08-20 ENCOUNTER — Ambulatory Visit (INDEPENDENT_AMBULATORY_CARE_PROVIDER_SITE_OTHER): Payer: Medicare Other | Admitting: Endocrinology

## 2021-08-20 VITALS — BP 122/80 | HR 90 | Ht 61.75 in | Wt 139.8 lb

## 2021-08-20 DIAGNOSIS — M8008XS Age-related osteoporosis with current pathological fracture, vertebra(e), sequela: Secondary | ICD-10-CM | POA: Diagnosis not present

## 2021-08-20 DIAGNOSIS — E559 Vitamin D deficiency, unspecified: Secondary | ICD-10-CM | POA: Diagnosis not present

## 2021-08-20 DIAGNOSIS — M8580 Other specified disorders of bone density and structure, unspecified site: Secondary | ICD-10-CM | POA: Diagnosis not present

## 2021-08-20 LAB — BASIC METABOLIC PANEL
BUN: 26 mg/dL — ABNORMAL HIGH (ref 6–23)
CO2: 33 mEq/L — ABNORMAL HIGH (ref 19–32)
Calcium: 9.8 mg/dL (ref 8.4–10.5)
Chloride: 99 mEq/L (ref 96–112)
Creatinine, Ser: 1.16 mg/dL (ref 0.40–1.20)
GFR: 48.77 mL/min — ABNORMAL LOW (ref >60.00)
Glucose, Bld: 98 mg/dL (ref 70–99)
Potassium: 4 mEq/L (ref 3.5–5.1)
Sodium: 140 mEq/L (ref 135–145)

## 2021-08-20 LAB — VITAMIN D 25 HYDROXY (VIT D DEFICIENCY, FRACTURES): VITD: 21.89 ng/mL — ABNORMAL LOW (ref 30.00–100.00)

## 2021-08-20 NOTE — Patient Instructions (Signed)
Vitamin D3, 5000 units daily ? ?Calcium '500mg'$  2x daily ?

## 2021-08-20 NOTE — Progress Notes (Signed)
Patient ID: Kim [name], female   DOB: [birth year], 66 y.o.   MRN: [id number] ? ?       ? ? ? ?Chief complaint: ? ?History of Present Illness: ? ?She was diagnosed to have osteoporosis in 1/22 ? ?She had a screening bone density done and this showed the following T-scores: ?Date of bone density 02/07/2021: Femoral neck:-1.7 ?Spine:-2.2 ?FRAX scores are: 16.2/ 2.2 ? ?She has history of low trauma fracture likely about 3 times, she says she had episodes of mild back pain with minimal movement ?She has known lumbar vertebral fracture identified at T6, T7 and T12 vertebral bodies ? ?Previously also had history of falls and syncope partly related to low blood pressure ?No recent episodes of back pain ?She has been on intermittent steroids for several years and now taking stable doses of 5 mg prednisone with higher doses when she has a COPD exacerbation ? ?She also has a history of height loss, her height in her youth was 5 feet 3 inches ? ?Age at menopause: late 39s, did not take HRT ? ?Previous treatment: None ?She was recommended Fosamax but she did not take it but is now open to taking the Reclast infusion that was recommended ? ?Calcium supplements: None ?Vitamin D supplements: None ? ?  ? ?LABS: ? ?Lab Results  ?Component Value Date  ? VD25OH 29.64 (L) 03/22/2018  ? ? ? ?Past Medical History:  ?Diagnosis Date  ? C8 RADICULOPATHY 06/05/2009  ? COPD 08/31/2007  ? DEPRESSION 11/19/2006  ? DYSLIPIDEMIA 03/12/2009  ? MI (mitral incompetence)   ? NEPHROLITHIASIS 01/05/2008  ? OSTEOARTHRITIS 06/05/2009  ? PARESTHESIA 08/24/2007  ? TOBACCO ABUSE 01/25/2010  ? ? ?Past Surgical History:  ?Procedure Laterality Date  ? ABDOMINAL HYSTERECTOMY    ? APPENDECTOMY  1969  ? CARDIAC CATHETERIZATION N/A 07/05/2015  ? Procedure: Left Heart Cath and Coronary Angiography;  Surgeon: Leonie Man, MD;  Location: [facility];  Service: Cardiovascular;  Laterality: N/A;  ? CHOLECYSTECTOMY  1989  ? collapse lung  1990  ? COLONOSCOPY WITH  PROPOFOL N/A 05/14/2018  ? Procedure: COLONOSCOPY WITH PROPOFOL;  Surgeon: Irene Shipper, MD;  Location: [facility];  Service: Endoscopy;  Laterality: N/A;  ? EXCISION MASS ABDOMINAL  2019  ? EXPLORATORY LAPAROTOMY  1969  ? LEFT HEART CATH AND CORONARY ANGIOGRAPHY N/A 11/19/2020  ? Procedure: LEFT HEART CATH AND CORONARY ANGIOGRAPHY;  Surgeon: [name], MD;  Location: [facility];  Service: Cardiovascular;  Laterality: N/A;  ? POLYPECTOMY  05/14/2018  ? Procedure: POLYPECTOMY;  Surgeon: Irene Shipper, MD;  Location: [facility];  Service: Endoscopy;;  ? SALPINGOOPHORECTOMY  2019  ? ? ?Family History  ?Problem Relation Age of Onset  ? Cancer Mother   ?     lung  ? Heart attack Mother   ? Hypertension Sister   ? Multiple sclerosis Brother   ? Diabetes Sister   ? Rheum arthritis Sister   ? Thyroid disease Sister   ? Multiple sclerosis Other   ? Alcohol abuse Other   ? Arthritis Other   ? Diabetes Other   ? Kidney disease Other   ? Cancer Other   ?     lung,ovarian,skin, uterine  ? Stroke Other   ? Heart disease Other   ? Melanoma Other   ? Osteoporosis Other   ? Heart attack Maternal Uncle   ? Heart attack Other   ?     NEICE  ? Heart  attack Other   ?     NEPHEW  ? Hypertension Brother   ? Stroke Maternal Grandmother   ? Colon cancer Neg Hx   ? ? ?Social History:  reports that she quit smoking about 7 years ago. Her smoking use included cigarettes. She has a 76.00 pack-year smoking history. She has never used smokeless tobacco. She reports that she does not drink alcohol and does not use drugs. ? ?Allergies:  ?Allergies  ?Allergen Reactions  ? Tape Other (See Comments)  ?  SKIN TEARS EASILY!!!!  ? Daliresp [Roflumilast]   ?  Skin peeling  ? Lisinopril Cough  ? Morphine And Related   ?  hallucinations  ? Penicillins Hives and Other (See Comments)  ?  Tolerates Omnicef ?Has patient had a PCN reaction causing immediate rash, facial/tongue/throat swelling, SOB or lightheadedness with hypotension: Yes ?Has  patient had a PCN reaction causing severe rash involving mucus membranes or skin necrosis: No ?Has patient had a PCN reaction that required hospitalization No ?Has patient had a PCN reaction occurring within the last 10 years: No ?If all of the above answers are "NO", then may proceed with Cephalosporin use. ?  ? Omnicef [Cefdinir] Rash  ? ? ?Allergies as of 08/20/2021   ? ?   Reactions  ? Tape Other (See Comments)  ? SKIN TEARS EASILY!!!!  ? Daliresp [roflumilast]   ? Skin peeling  ? Lisinopril Cough  ? Morphine And Related   ? hallucinations  ? Penicillins Hives, Other (See Comments)  ? Tolerates Omnicef ?Has patient had a PCN reaction causing immediate rash, facial/tongue/throat swelling, SOB or lightheadedness with hypotension: Yes ?Has patient had a PCN reaction causing severe rash involving mucus membranes or skin necrosis: No ?Has patient had a PCN reaction that required hospitalization No ?Has patient had a PCN reaction occurring within the last 10 years: No ?If all of the above answers are "NO", then may proceed with Cephalosporin use.  ? Omnicef [cefdinir] Rash  ? ?  ? ?  ?Medication List  ?  ? ?  ? Accurate as of Aug 20, 2021 11:53 AM. If you have any questions, ask your nurse or doctor.  ?  ?  ? ?  ? ?albuterol 108 (90 Base) MCG/ACT inhaler ?Commonly known as: VENTOLIN HFA ?INHALE TWO PUFFS INTO LUNGS EVERY 4 TO 6 HOURS AS NEEDED FOR SHORTNESS OF BREATH/WHEEZING. ?  ?ALPRAZolam 0.5 MG tablet ?Commonly known as: Duanne Moron ?Take 1 tablet (0.5 mg total) by mouth 3 (three) times daily as needed for anxiety. ?  ?aspirin 81 MG EC tablet ?Take 1 tablet (81 mg total) by mouth daily. ?  ?cetirizine 10 MG tablet ?Commonly known as: ZYRTEC ?Take 10 mg by mouth daily. ?  ?furosemide 40 MG tablet ?Commonly known as: LASIX ?Take 1 tablet (40 mg total) by mouth daily as needed (for an overnight weight gain of 3-5 pounds of fluid). ?  ?HYDROcodone-acetaminophen 7.5-325 MG tablet ?Commonly known as: Norco ?Take 1 tablet by  mouth every 6 (six) hours as needed for moderate pain. ?  ?ipratropium-albuterol 0.5-2.5 (3) MG/3ML Soln ?Commonly known as: DUONEB ?USE 3 ML VIA NEBULIZER EVERY 6 HOURS AS NEEDED.  DX: J44.9 ?  ?losartan 25 MG tablet ?Commonly known as: COZAAR ?Take 1 tablet (25 mg total) by mouth daily. ?  ?magnesium oxide 400 MG tablet ?Commonly known as: MAG-OX ?Take 400 mg by mouth daily. ?  ?metoprolol succinate 25 MG 24 hr tablet ?Commonly known as: TOPROL-XL ?Take 0.5 tablets (  12.5 mg total) by mouth daily. ?  ?Mucinex Maximum Strength 1200 MG Tb12 ?Generic drug: Guaifenesin ?Take 1,200 mg by mouth every 12 (twelve) hours. ?  ?nitroGLYCERIN 0.4 MG SL tablet ?Commonly known as: NITROSTAT ?Place 1 tablet (0.4 mg total) under the tongue every 5 (five) minutes as needed for chest pain. ?  ?OXYGEN ?Inhale 2 L/min into the lungs continuous. ?  ?PARoxetine 30 MG tablet ?Commonly known as: PAXIL ?Take 1 tablet (30 mg total) by mouth daily. ?  ?Potassium 99 MG Tabs ?Take 1 tablet by mouth as needed. ?  ?predniSONE 10 MG tablet ?Commonly known as: DELTASONE ?Take 1 tablet (10 mg total) by mouth daily. Take 4 pills daily for 3 days then 3 pills daily for 3 days then 2 pills daily for 3 days then continue taking 1 pill a day until you follow-up with your pulmonologist ?  ?predniSONE 5 MG tablet ?Commonly known as: DELTASONE ?Take 1 tablet (5 mg total) by mouth daily with breakfast. ?  ?rosuvastatin 20 MG tablet ?Commonly known as: CRESTOR ?Take 20 mg by mouth at bedtime. ?  ?Spacer/Aero [employer] Misc ?1 Device by Does not apply route as directed. ?  ?Spiriva Respimat 2.5 MCG/ACT Aers ?Generic drug: Tiotropium Bromide Monohydrate ?Inhale 2 puffs into the lungs daily. ?  ?Symbicort 160-4.5 MCG/ACT inhaler ?Generic drug: budesonide-formoterol ?INHALE 2 PUFFS TWICE DAILY ?What changed: how to take this ?  ? ?  ? ? ? ?Review of Systems  ?Constitutional:  Positive for weight gain.  ?Musculoskeletal:  Positive for back pain.   ?Neurological:  Positive for balance difficulty.  ? ?She has severe COPD, chronically taking steroids and on oxygen ?Also history of CHF, history of MI and dyslipidemia ? ? ?PHYSICAL EXAM: ? ?BP 122/80   Pulse 90   H

## 2021-08-23 ENCOUNTER — Telehealth: Payer: Self-pay | Admitting: Pharmacy Technician

## 2021-08-23 NOTE — Telephone Encounter (Signed)
Dr. Dwyane Dee, Juluis Rainier note:  Auth Submission: no auth needed Payer: medicare a/b / [employer] SUPP Medication & CPT/J Code(s) submitted: Reclast (Zolendronic acid) F6922 Route of submission (phone, fax, portal): PHONE Auth type: Buy/Bill Units/visits requested: X1 DOSE Reference number:  Approval from: 08/23/21 to 03/25/22   Patient will be scheduled as soon as possible.

## 2021-08-26 ENCOUNTER — Inpatient Hospital Stay (HOSPITAL_COMMUNITY): Payer: Medicare Other

## 2021-08-26 ENCOUNTER — Emergency Department (HOSPITAL_COMMUNITY): Payer: Medicare Other

## 2021-08-26 ENCOUNTER — Encounter (HOSPITAL_COMMUNITY): Payer: Self-pay | Admitting: Internal Medicine

## 2021-08-26 ENCOUNTER — Inpatient Hospital Stay (HOSPITAL_COMMUNITY)
Admission: EM | Admit: 2021-08-26 | Discharge: 2021-08-29 | DRG: 085 | Disposition: A | Discharge status: Skilled Nursing Facility | Payer: Medicare Other | Attending: Internal Medicine | Admitting: Internal Medicine

## 2021-08-26 DIAGNOSIS — I340 Nonrheumatic mitral (valve) insufficiency: Secondary | ICD-10-CM | POA: Diagnosis present

## 2021-08-26 DIAGNOSIS — Z7951 Long term (current) use of inhaled steroids: Secondary | ICD-10-CM | POA: Diagnosis not present

## 2021-08-26 DIAGNOSIS — Z881 Allergy status to other antibiotic agents status: Secondary | ICD-10-CM | POA: Diagnosis not present

## 2021-08-26 DIAGNOSIS — Z888 Allergy status to other drugs, medicaments and biological substances status: Secondary | ICD-10-CM

## 2021-08-26 DIAGNOSIS — R6510 Systemic inflammatory response syndrome (SIRS) of non-infectious origin without acute organ dysfunction: Secondary | ICD-10-CM | POA: Diagnosis present

## 2021-08-26 DIAGNOSIS — Z833 Family history of diabetes mellitus: Secondary | ICD-10-CM

## 2021-08-26 DIAGNOSIS — R7989 Other specified abnormal findings of blood chemistry: Secondary | ICD-10-CM | POA: Diagnosis present

## 2021-08-26 DIAGNOSIS — J9622 Acute and chronic respiratory failure with hypercapnia: Secondary | ICD-10-CM | POA: Diagnosis present

## 2021-08-26 DIAGNOSIS — R778 Other specified abnormalities of plasma proteins: Secondary | ICD-10-CM | POA: Diagnosis present

## 2021-08-26 DIAGNOSIS — F32A Depression, unspecified: Secondary | ICD-10-CM | POA: Diagnosis present

## 2021-08-26 DIAGNOSIS — F419 Anxiety disorder, unspecified: Secondary | ICD-10-CM | POA: Diagnosis present

## 2021-08-26 DIAGNOSIS — R55 Syncope and collapse: Secondary | ICD-10-CM | POA: Diagnosis present

## 2021-08-26 DIAGNOSIS — J9621 Acute and chronic respiratory failure with hypoxia: Secondary | ICD-10-CM | POA: Diagnosis present

## 2021-08-26 DIAGNOSIS — Z9981 Dependence on supplemental oxygen: Secondary | ICD-10-CM | POA: Diagnosis not present

## 2021-08-26 DIAGNOSIS — I5020 Unspecified systolic (congestive) heart failure: Secondary | ICD-10-CM | POA: Diagnosis not present

## 2021-08-26 DIAGNOSIS — Z7982 Long term (current) use of aspirin: Secondary | ICD-10-CM

## 2021-08-26 DIAGNOSIS — S066X0A Traumatic subarachnoid hemorrhage without loss of consciousness, initial encounter: Secondary | ICD-10-CM | POA: Diagnosis present

## 2021-08-26 DIAGNOSIS — I5030 Unspecified diastolic (congestive) heart failure: Secondary | ICD-10-CM | POA: Diagnosis present

## 2021-08-26 DIAGNOSIS — I110 Hypertensive heart disease with heart failure: Secondary | ICD-10-CM | POA: Diagnosis present

## 2021-08-26 DIAGNOSIS — Z880 Allergy status to penicillin: Secondary | ICD-10-CM

## 2021-08-26 DIAGNOSIS — Z885 Allergy status to narcotic agent status: Secondary | ICD-10-CM

## 2021-08-26 DIAGNOSIS — M8008XA Age-related osteoporosis with current pathological fracture, vertebra(e), initial encounter for fracture: Secondary | ICD-10-CM | POA: Diagnosis present

## 2021-08-26 DIAGNOSIS — M810 Age-related osteoporosis without current pathological fracture: Secondary | ICD-10-CM | POA: Diagnosis present

## 2021-08-26 DIAGNOSIS — H532 Diplopia: Secondary | ICD-10-CM | POA: Diagnosis present

## 2021-08-26 DIAGNOSIS — M1990 Unspecified osteoarthritis, unspecified site: Secondary | ICD-10-CM | POA: Diagnosis present

## 2021-08-26 DIAGNOSIS — E785 Hyperlipidemia, unspecified: Secondary | ICD-10-CM | POA: Diagnosis present

## 2021-08-26 DIAGNOSIS — Z8249 Family history of ischemic heart disease and other diseases of the circulatory system: Secondary | ICD-10-CM

## 2021-08-26 DIAGNOSIS — S065X0A Traumatic subdural hemorrhage without loss of consciousness, initial encounter: Secondary | ICD-10-CM | POA: Diagnosis present

## 2021-08-26 DIAGNOSIS — N1831 Chronic kidney disease, stage 3a: Secondary | ICD-10-CM | POA: Diagnosis present

## 2021-08-26 DIAGNOSIS — Z820 Family history of epilepsy and other diseases of the nervous system: Secondary | ICD-10-CM

## 2021-08-26 DIAGNOSIS — Y92019 Unspecified place in single-family (private) house as the place of occurrence of the external cause: Secondary | ICD-10-CM

## 2021-08-26 DIAGNOSIS — I5042 Chronic combined systolic (congestive) and diastolic (congestive) heart failure: Secondary | ICD-10-CM | POA: Diagnosis present

## 2021-08-26 DIAGNOSIS — I609 Nontraumatic subarachnoid hemorrhage, unspecified: Secondary | ICD-10-CM

## 2021-08-26 DIAGNOSIS — N1832 Chronic kidney disease, stage 3b: Secondary | ICD-10-CM | POA: Diagnosis present

## 2021-08-26 DIAGNOSIS — Z823 Family history of stroke: Secondary | ICD-10-CM

## 2021-08-26 DIAGNOSIS — W1830XA Fall on same level, unspecified, initial encounter: Secondary | ICD-10-CM | POA: Diagnosis present

## 2021-08-26 DIAGNOSIS — M8000XD Age-related osteoporosis with current pathological fracture, unspecified site, subsequent encounter for fracture with routine healing: Secondary | ICD-10-CM

## 2021-08-26 DIAGNOSIS — I5032 Chronic diastolic (congestive) heart failure: Secondary | ICD-10-CM | POA: Diagnosis present

## 2021-08-26 DIAGNOSIS — Z8261 Family history of arthritis: Secondary | ICD-10-CM

## 2021-08-26 DIAGNOSIS — I951 Orthostatic hypotension: Secondary | ICD-10-CM | POA: Diagnosis not present

## 2021-08-26 DIAGNOSIS — I252 Old myocardial infarction: Secondary | ICD-10-CM

## 2021-08-26 DIAGNOSIS — R911 Solitary pulmonary nodule: Secondary | ICD-10-CM

## 2021-08-26 DIAGNOSIS — Z87891 Personal history of nicotine dependence: Secondary | ICD-10-CM

## 2021-08-26 DIAGNOSIS — F0781 Postconcussional syndrome: Secondary | ICD-10-CM | POA: Diagnosis present

## 2021-08-26 DIAGNOSIS — J9611 Chronic respiratory failure with hypoxia: Secondary | ICD-10-CM | POA: Diagnosis present

## 2021-08-26 DIAGNOSIS — N179 Acute kidney failure, unspecified: Secondary | ICD-10-CM | POA: Diagnosis present

## 2021-08-26 DIAGNOSIS — J441 Chronic obstructive pulmonary disease with (acute) exacerbation: Secondary | ICD-10-CM | POA: Diagnosis present

## 2021-08-26 DIAGNOSIS — Z8349 Family history of other endocrine, nutritional and metabolic diseases: Secondary | ICD-10-CM

## 2021-08-26 DIAGNOSIS — J9601 Acute respiratory failure with hypoxia: Secondary | ICD-10-CM

## 2021-08-26 HISTORY — DX: Chronic kidney disease, stage 3a: N18.31

## 2021-08-26 LAB — TYPE AND SCREEN
ABO/RH(D): A POS
Antibody Screen: NEGATIVE

## 2021-08-26 LAB — TROPONIN I (HIGH SENSITIVITY)
Troponin I (High Sensitivity): 76 ng/L — ABNORMAL HIGH (ref <18)
Troponin I (High Sensitivity): 311 ng/L (ref <18)
Troponin I (High Sensitivity): 423 ng/L (ref <18)
Troponin I (High Sensitivity): 676 ng/L (ref <18)

## 2021-08-26 LAB — BASIC METABOLIC PANEL
Anion gap: 7 (ref 5–15)
BUN: 25 mg/dL — ABNORMAL HIGH (ref 8–23)
CO2: 33 mmol/L — ABNORMAL HIGH (ref 22–32)
Calcium: 9.7 mg/dL (ref 8.9–10.3)
Chloride: 101 mmol/L (ref 98–111)
Creatinine, Ser: 1.3 mg/dL — ABNORMAL HIGH (ref 0.44–1.00)
GFR, Estimated: 45 mL/min — ABNORMAL LOW (ref >60)
Glucose, Bld: 123 mg/dL — ABNORMAL HIGH (ref 70–99)
Potassium: 4.3 mmol/L (ref 3.5–5.1)
Sodium: 141 mmol/L (ref 135–145)

## 2021-08-26 LAB — BRAIN NATRIURETIC PEPTIDE: B Natriuretic Peptide: 96.4 pg/mL (ref 0.0–100.0)

## 2021-08-26 LAB — ECHOCARDIOGRAM COMPLETE
AR max vel: 2.06 cm2
AV Area VTI: 1.99 cm2
AV Area mean vel: 2.06 cm2
AV Mean grad: 4.5 mmHg
AV Peak grad: 8.7 mmHg
Ao pk vel: 1.48 m/s
Area-P 1/2: 8.92 cm2
Calc EF: 38 %
Height: 61 in
P 1/2 time: 275 msec
S' Lateral: 3.7 cm
Single Plane A2C EF: 30.8 %
Single Plane A4C EF: 41.6 %
Weight: 2304 oz

## 2021-08-26 LAB — CBC
HCT: 40.2 % (ref 36.0–46.0)
Hemoglobin: 13 g/dL (ref 12.0–15.0)
MCH: 31.9 pg (ref 26.0–34.0)
MCHC: 32.3 g/dL (ref 30.0–36.0)
MCV: 98.8 fL (ref 80.0–100.0)
Platelets: 337 10*3/uL (ref 150–400)
RBC: 4.07 MIL/uL (ref 3.87–5.11)
RDW: 14.3 % (ref 11.5–15.5)
WBC: 20.2 10*3/uL — ABNORMAL HIGH (ref 4.0–10.5)
nRBC: 0 % (ref 0.0–0.2)

## 2021-08-26 LAB — I-STAT ARTERIAL BLOOD GAS, ED
Acid-Base Excess: 5 mmol/L — ABNORMAL HIGH (ref 0.0–2.0)
Bicarbonate: 30.9 mmol/L — ABNORMAL HIGH (ref 20.0–28.0)
Calcium, Ion: 1.22 mmol/L (ref 1.15–1.40)
HCT: 34 % — ABNORMAL LOW (ref 36.0–46.0)
Hemoglobin: 11.6 g/dL — ABNORMAL LOW (ref 12.0–15.0)
O2 Saturation: 97 %
Patient temperature: 98.1
Potassium: 4 mmol/L (ref 3.5–5.1)
Sodium: 137 mmol/L (ref 135–145)
TCO2: 32 mmol/L (ref 22–32)
pCO2 arterial: 50.1 mmHg — ABNORMAL HIGH (ref 32–48)
pH, Arterial: 7.397 (ref 7.35–7.45)
pO2, Arterial: 95 mmHg (ref 83–108)

## 2021-08-26 LAB — PROTIME-INR
INR: 0.9 (ref 0.8–1.2)
Prothrombin Time: 11.9 seconds (ref 11.4–15.2)

## 2021-08-26 LAB — URINALYSIS, ROUTINE W REFLEX MICROSCOPIC
Bilirubin Urine: NEGATIVE
Glucose, UA: NEGATIVE mg/dL
Hgb urine dipstick: NEGATIVE
Ketones, ur: NEGATIVE mg/dL
Leukocytes,Ua: NEGATIVE
Nitrite: NEGATIVE
Protein, ur: NEGATIVE mg/dL
Specific Gravity, Urine: 1.021 (ref 1.005–1.030)
pH: 5 (ref 5.0–8.0)

## 2021-08-26 LAB — TSH: TSH: 0.373 u[IU]/mL (ref 0.350–4.500)

## 2021-08-26 LAB — ABO/RH: ABO/RH(D): A POS

## 2021-08-26 LAB — PROCALCITONIN: Procalcitonin: <0.1 ng/mL

## 2021-08-26 MED ORDER — PERFLUTREN LIPID MICROSPHERE
1.0000 mL | INTRAVENOUS | Status: AC | PRN
  Administered 2021-08-26: 2 mL via INTRAVENOUS

## 2021-08-26 MED ORDER — AZITHROMYCIN 500 MG PO TABS
500.0000 mg | ORAL_TABLET | Freq: Every day | ORAL | Status: DC
  Administered 2021-08-27 – 2021-08-29 (×3): 500 mg via ORAL
  Filled 2021-08-26 (×3): qty 1

## 2021-08-26 MED ORDER — PREDNISONE 20 MG PO TABS
60.0000 mg | ORAL_TABLET | Freq: Every day | ORAL | Status: DC
Start: 2021-08-27 — End: 2021-08-27

## 2021-08-26 MED ORDER — GUAIFENESIN ER 600 MG PO TB12
1200.0000 mg | ORAL_TABLET | Freq: Two times a day (BID) | ORAL | Status: DC
  Administered 2021-08-26 – 2021-08-29 (×7): 1200 mg via ORAL
  Filled 2021-08-26 (×7): qty 2

## 2021-08-26 MED ORDER — ALPRAZOLAM 0.5 MG PO TABS
0.5000 mg | ORAL_TABLET | Freq: Three times a day (TID) | ORAL | Status: DC | PRN
  Administered 2021-08-26 – 2021-08-29 (×7): 0.5 mg via ORAL
  Filled 2021-08-26 (×7): qty 1

## 2021-08-26 MED ORDER — LOSARTAN POTASSIUM 50 MG PO TABS
25.0000 mg | ORAL_TABLET | Freq: Every day | ORAL | Status: DC
  Administered 2021-08-26 – 2021-08-29 (×4): 25 mg via ORAL
  Filled 2021-08-26 (×4): qty 1

## 2021-08-26 MED ORDER — MAGNESIUM OXIDE -MG SUPPLEMENT 400 (240 MG) MG PO TABS
400.0000 mg | ORAL_TABLET | Freq: Every day | ORAL | Status: DC
  Administered 2021-08-26 – 2021-08-29 (×4): 400 mg via ORAL
  Filled 2021-08-26 (×4): qty 1

## 2021-08-26 MED ORDER — IPRATROPIUM-ALBUTEROL 0.5-2.5 (3) MG/3ML IN SOLN
3.0000 mL | Freq: Four times a day (QID) | RESPIRATORY_TRACT | Status: DC
  Administered 2021-08-26 – 2021-08-29 (×13): 3 mL via RESPIRATORY_TRACT
  Filled 2021-08-26 (×13): qty 3

## 2021-08-26 MED ORDER — BUTALBITAL-APAP-CAFFEINE 50-325-40 MG PO TABS
1.0000 | ORAL_TABLET | Freq: Four times a day (QID) | ORAL | Status: DC | PRN
  Administered 2021-08-26 – 2021-08-28 (×3): 1 via ORAL
  Filled 2021-08-26 (×3): qty 1

## 2021-08-26 MED ORDER — ARFORMOTEROL TARTRATE 15 MCG/2ML IN NEBU
15.0000 ug | INHALATION_SOLUTION | Freq: Two times a day (BID) | RESPIRATORY_TRACT | Status: DC
  Administered 2021-08-26 – 2021-08-29 (×7): 15 ug via RESPIRATORY_TRACT
  Filled 2021-08-26 (×7): qty 2

## 2021-08-26 MED ORDER — ACETAMINOPHEN 650 MG RE SUPP
650.0000 mg | Freq: Four times a day (QID) | RECTAL | Status: DC | PRN
Start: 2021-08-26 — End: 2021-08-29

## 2021-08-26 MED ORDER — HYDROCODONE-ACETAMINOPHEN 7.5-325 MG PO TABS
1.0000 | ORAL_TABLET | Freq: Two times a day (BID) | ORAL | Status: DC | PRN
  Administered 2021-08-26 – 2021-08-29 (×4): 1 via ORAL
  Filled 2021-08-26 (×4): qty 1

## 2021-08-26 MED ORDER — LORATADINE 10 MG PO TABS
10.0000 mg | ORAL_TABLET | Freq: Every day | ORAL | Status: DC
Start: 2021-08-26 — End: 2021-08-29
  Administered 2021-08-26 – 2021-08-29 (×4): 10 mg via ORAL
  Filled 2021-08-26 (×4): qty 1

## 2021-08-26 MED ORDER — SODIUM CHLORIDE 0.9 % IV SOLN
500.0000 mg | INTRAVENOUS | Status: AC
  Administered 2021-08-26: 500 mg via INTRAVENOUS
  Filled 2021-08-26: qty 5

## 2021-08-26 MED ORDER — ALBUTEROL SULFATE (2.5 MG/3ML) 0.083% IN NEBU
2.5000 mg | INHALATION_SOLUTION | RESPIRATORY_TRACT | Status: DC | PRN
  Administered 2021-08-26 – 2021-08-28 (×5): 2.5 mg via RESPIRATORY_TRACT
  Filled 2021-08-26 (×5): qty 3

## 2021-08-26 MED ORDER — MAGNESIUM SULFATE 2 GM/50ML IV SOLN
2.0000 g | Freq: Once | INTRAVENOUS | Status: AC
  Administered 2021-08-26: 2 g via INTRAVENOUS
  Filled 2021-08-26: qty 50

## 2021-08-26 MED ORDER — SODIUM CHLORIDE 0.9 % IV SOLN: Freq: Once | INTRAVENOUS | Status: AC

## 2021-08-26 MED ORDER — ALBUTEROL SULFATE (2.5 MG/3ML) 0.083% IN NEBU
INHALATION_SOLUTION | RESPIRATORY_TRACT | Status: AC
  Administered 2021-08-26: 10 mg/h via RESPIRATORY_TRACT
  Filled 2021-08-26: qty 12

## 2021-08-26 MED ORDER — BUDESONIDE 0.5 MG/2ML IN SUSP
0.5000 mg | Freq: Two times a day (BID) | RESPIRATORY_TRACT | Status: DC
  Administered 2021-08-26 – 2021-08-28 (×6): 0.5 mg via RESPIRATORY_TRACT
  Filled 2021-08-26 (×7): qty 2

## 2021-08-26 MED ORDER — HYDRALAZINE HCL 20 MG/ML IJ SOLN
10.0000 mg | INTRAMUSCULAR | Status: DC | PRN
  Filled 2021-08-26: qty 1

## 2021-08-26 MED ORDER — NYSTATIN 100000 UNIT/ML MT SUSP: 5.0000 mL | Freq: Three times a day (TID) | OROMUCOSAL | Status: DC | PRN

## 2021-08-26 MED ORDER — ALBUTEROL SULFATE (2.5 MG/3ML) 0.083% IN NEBU
10.0000 mg/h | INHALATION_SOLUTION | Freq: Once | RESPIRATORY_TRACT | Status: AC
Start: 2021-08-26 — End: 2021-08-26

## 2021-08-26 MED ORDER — METOPROLOL SUCCINATE ER 25 MG PO TB24
12.5000 mg | ORAL_TABLET | Freq: Every day | ORAL | Status: DC
Start: 2021-08-26 — End: 2021-08-29
  Administered 2021-08-26 – 2021-08-29 (×4): 12.5 mg via ORAL
  Filled 2021-08-26 (×4): qty 1

## 2021-08-26 MED ORDER — ACETAMINOPHEN 325 MG PO TABS
650.0000 mg | ORAL_TABLET | Freq: Four times a day (QID) | ORAL | Status: DC | PRN
  Administered 2021-08-26: 650 mg via ORAL
  Filled 2021-08-26: qty 2

## 2021-08-26 MED ORDER — PAROXETINE HCL 30 MG PO TABS
30.0000 mg | ORAL_TABLET | Freq: Every day | ORAL | Status: DC
  Administered 2021-08-27 – 2021-08-29 (×3): 30 mg via ORAL
  Filled 2021-08-26 (×4): qty 1

## 2021-08-26 MED ORDER — LIDOCAINE 5 % EX PTCH
1.0000 | MEDICATED_PATCH | CUTANEOUS | Status: DC
  Administered 2021-08-26 – 2021-08-29 (×4): 1 via TRANSDERMAL
  Filled 2021-08-26 (×4): qty 1

## 2021-08-26 MED ORDER — ACETAMINOPHEN 325 MG PO TABS
650.0000 mg | ORAL_TABLET | ORAL | Status: DC | PRN
Start: 2021-08-26 — End: 2021-08-26
  Administered 2021-08-26: 650 mg via ORAL
  Filled 2021-08-26: qty 2

## 2021-08-26 MED ORDER — SODIUM CHLORIDE 0.9 % IV SOLN
12.5000 mg | Freq: Once | INTRAVENOUS | Status: AC
  Administered 2021-08-26: 12.5 mg via INTRAVENOUS
  Filled 2021-08-26: qty 0.5

## 2021-08-26 MED ORDER — LORAZEPAM 2 MG/ML IJ SOLN: 0.5000 mg | Freq: Once | INTRAMUSCULAR | Status: DC | PRN

## 2021-08-26 MED ORDER — SODIUM CHLORIDE 0.9% FLUSH
3.0000 mL | Freq: Two times a day (BID) | INTRAVENOUS | Status: DC
  Administered 2021-08-26 – 2021-08-29 (×7): 3 mL via INTRAVENOUS

## 2021-08-26 MED ORDER — OLOPATADINE HCL 0.1 % OP SOLN
1.0000 [drp] | Freq: Every day | OPHTHALMIC | Status: DC
  Administered 2021-08-27 – 2021-08-29 (×3): 1 [drp] via OPHTHALMIC
  Filled 2021-08-26 (×2): qty 5

## 2021-08-26 MED ORDER — DIPHENHYDRAMINE HCL 50 MG/ML IJ SOLN
12.5000 mg | Freq: Once | INTRAMUSCULAR | Status: AC
  Administered 2021-08-26: 12.5 mg via INTRAVENOUS
  Filled 2021-08-26: qty 1

## 2021-08-26 NOTE — Progress Notes (Addendum)
Kim [name] is alert and oriented x4. No complaints of pain. Admitted pt to 2C00.   Critical lab called in: troponin 676. Paged MD R. Tamala Julian

## 2021-08-26 NOTE — ED Triage Notes (Addendum)
Pt bib [facility] from home c/o SOB and increased BP. Pt woke up to go to bathroom, felt dizzy, checked BP (200s sys). Took 3 albuterol treatments prior to EMS arrival with no relief. '125mg'$  solumedrol + duoneb given by EMS. BP 156/100, pt diaphoretic, 100% 6L [place], 2L [place] at baseline. Pt reported falling on Friday, endorses hitting head, pt ws waiting to come in on Monday. 18G LAC

## 2021-08-26 NOTE — H&P (Addendum)
History and Physical    Patient: Kim [name] CBJ:[id number] DOB: 11/07/1955 DOA: 08/26/2021 DOS: the patient was seen and examined on 08/26/2021 PCP: Kim Peng, NP  Patient coming from: Home via EMS  Chief Complaint:  Chief Complaint  Patient presents with   Shortness of Breath   HPI: Kim [name] is a 66 y.o. female with medical history significant of hypertension, dyslipidemia, COPD, chronic respiratory failure on 2 L, alpha-1 antitrypsin carrier, Takotsubo cardiomyopathy , osteoporosis, depression, and anxiety who presents with complaints of shortness of breath overnight.  She had recently fallen backwards 3 days ago while getting up to get a banana.  She denied feeling lightheaded or dizzy at that time, but fell backwards hitting her head and right elbow on a table.  She lost consciousness, but does not know for how long.  She was not evaluated at that time and is just on a daily aspirin.  However since then patient reports that she is had a persistent headache with back, hip pain, and reports that she has been having trouble with her memory.  She chronically short of breath with productive cough and wheezing for which she is chronically on steroids.  She denies having any recent fever, chills, chest pain, nausea, vomiting, or diarrhea.  Patient reports that she has been having issues where whenever she is getting up and moving around she has been having these intermittent jerking movements of her upper extremities for several months.  She was evaluated by neurology and had negative EEG monitoring.  Patient reports that orthostatics were also obtained and her blood pressure dropped 30 points from sitting to standing.  She was advised to follow back up with her primary care provider, but no changes have been made to her medication regimen.  For last 2 weeks she has been having double vision when looking to the left.  She followed up with provider who had performed her cataract surgery,  but it was not thought to be related to her cataracts.  She reports that she was told that it may be secondary to the possibility of eye stroke and symptoms areare only present when she is looking to the left with both eyes.  Over the last 4 days her weight has been elevated up to 144 pounds when normally she is around 137 pounds, and she had been taking furosemide 40 mg daily.  This morning when she woke up she reported not feeling well.  She reported being diaphoretic with worsening of her shortness of breath and lightheadedness.  When [name] checked her blood pressure reported that it was elevated into the 200s.  She had tried 3 albuterol treatments prior to EMS arrival without improvement in symptoms.  In route with EMS patient had been given Solu-Medrol 125 mg IV, and DuoNeb breathing treatment.  Patient was noted to be diaphoretic with blood pressure 156/100 and O2 saturations 100% on 6 L nasal cannula oxygen.  Upon admission into the emergency department patient was noted to be afebrile with respirations 21-24, and O2 saturations currently maintained on 10 L via aerosol mask.  Labs significant for WBC 20.2, CO2 33, BUN 25, creatinine 1.3 BNP 96.4, and high-sensitivity troponin 76.  CT scan of the head significant for trace subarachnoid hemorrhage along the interhemispheric fissure.  Case had been discussed with Dr. Venetia Constable of neurosurgery who did not recommend any further imaging or work-up.  Chest x-ray significant for irregular opacity of the right upper lobe mid perihilar area thought possibly be  a scarlike thickening noted on prior CT from February.  X-rays of the lumbar spine noted degenerative changes without acute abnormality.  X-rays of the thoracic spine noted loss of vertebral body height at 2 levels in the mid thoracic spine likely T6 6 and T7 indeterminate age and possible sclerosis of the right T7 pedicle.  Patient having given continuous albuterol neb and 2 g of magnesium sulfate  IV.  Review of Systems: As mentioned in the history of present illness. All other systems reviewed and are negative. 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   OSTEOARTHRITIS 06/05/2009   PARESTHESIA 08/24/2007   TOBACCO ABUSE 01/25/2010   Past Surgical History:  Procedure Laterality Date   ABDOMINAL HYSTERECTOMY     APPENDECTOMY  1969   CARDIAC CATHETERIZATION N/A 07/05/2015   Procedure: Left Heart Cath and Coronary Angiography;  Surgeon: Leonie Man, MD;  Location: [facility];  Service: Cardiovascular;  Laterality: N/A;   CHOLECYSTECTOMY  1989   collapse lung  1990   COLONOSCOPY WITH PROPOFOL N/A 05/14/2018   Procedure: COLONOSCOPY WITH PROPOFOL;  Surgeon: Irene Shipper, MD;  Location: [facility];  Service: Endoscopy;  Laterality: N/A;   EXCISION MASS ABDOMINAL  2019   EXPLORATORY LAPAROTOMY  1969   LEFT HEART CATH AND CORONARY ANGIOGRAPHY N/A 11/19/2020   Procedure: LEFT HEART CATH AND CORONARY ANGIOGRAPHY;  Surgeon: [name], MD;  Location: [facility];  Service: Cardiovascular;  Laterality: N/A;   POLYPECTOMY  05/14/2018   Procedure: POLYPECTOMY;  Surgeon: Irene Shipper, MD;  Location: [facility];  Service: Endoscopy;;   SALPINGOOPHORECTOMY  2019   Social History:  reports that she quit smoking about 7 years ago. Her smoking use included cigarettes. She has a 76.00 pack-year smoking history. She has never used smokeless tobacco. She reports that she does not drink alcohol and does not use drugs.  Allergies  Allergen Reactions   Tape Other (See Comments)    SKIN TEARS EASILY!!!!   Daliresp [Roflumilast]     Skin peeling   Lisinopril Cough   Morphine And Related     hallucinations   Penicillins Hives and Other (See Comments)    Tolerates Omnicef Has patient had a PCN reaction causing immediate rash,  facial/tongue/throat swelling, SOB or lightheadedness with hypotension: Yes Has patient had a PCN reaction causing severe rash involving mucus membranes or skin necrosis: No Has patient had a PCN reaction that required hospitalization No Has patient had a PCN reaction occurring within the last 10 years: No If all of the above answers are "NO", then may proceed with Cephalosporin use.    Omnicef [Cefdinir] Rash    Family History  Problem Relation Age of Onset   Cancer Mother        lung   Heart attack Mother    Hypertension Sister    Multiple sclerosis Brother    Diabetes Sister    Rheum arthritis Sister    Thyroid disease Sister    Multiple sclerosis Other    Alcohol abuse Other    Arthritis Other    Diabetes Other    Kidney disease Other    Cancer Other        lung,ovarian,skin, uterine   Stroke Other    Heart disease Other    Melanoma Other    Osteoporosis Other  Heart attack Maternal Uncle    Heart attack Other        [place] attack Other        NEPHEW   Hypertension Brother    Stroke Maternal Grandmother    Colon cancer Neg Hx     Prior to Admission medications   Medication Sig Start Date End Date Taking? Authorizing Provider  albuterol (VENTOLIN HFA) 108 (90 Base) MCG/ACT inhaler INHALE TWO PUFFS INTO LUNGS EVERY 4 TO 6 HOURS AS NEEDED FOR SHORTNESS OF BREATH/WHEEZING. 07/26/21   Martyn Ehrich, NP  ALPRAZolam Duanne Moron) 0.5 MG tablet Take 1 tablet (0.5 mg total) by mouth 3 (three) times daily as needed for anxiety. 07/10/21   Nafziger, Tommi Rumps, NP  aspirin EC 81 MG EC tablet Take 1 tablet (81 mg total) by mouth daily. 07/08/15   Nita Sells, MD  cetirizine (ZYRTEC) 10 MG tablet Take 10 mg by mouth daily.    [provider]  furosemide (LASIX) 40 MG tablet Take 1 tablet (40 mg total) by mouth daily as needed (for an overnight weight gain of 3-5 pounds of fluid). 08/16/21   [name], MD  HYDROcodone-acetaminophen (NORCO) 7.5-325 MG  tablet Take 1 tablet by mouth every 6 (six) hours as needed for moderate pain. 06/04/21   Nafziger, Tommi Rumps, NP  ipratropium-albuterol (DUONEB) 0.5-2.5 (3) MG/3ML SOLN USE 3 ML VIA NEBULIZER EVERY 6 HOURS AS NEEDED.  DX: J44.9 07/09/21   Margaretha Seeds, MD  losartan (COZAAR) 25 MG tablet Take 1 tablet (25 mg total) by mouth daily. 04/26/21   Richardson Dopp T, PA-C  magnesium oxide (MAG-OX) 400 MG tablet Take 400 mg by mouth daily.    [provider]  metoprolol succinate (TOPROL-XL) 25 MG 24 hr tablet Take 0.5 tablets (12.5 mg total) by mouth daily. 01/04/21   [name], MD  MUCINEX MAXIMUM STRENGTH 1200 MG TB12 Take 1,200 mg by mouth every 12 (twelve) hours.    [provider]  nitroGLYCERIN (NITROSTAT) 0.4 MG SL tablet Place 1 tablet (0.4 mg total) under the tongue every 5 (five) minutes as needed for chest pain. 10/24/20   [name], MD  OXYGEN Inhale 2 L/min into the lungs continuous.    [provider]  PARoxetine (PAXIL) 30 MG tablet Take 1 tablet (30 mg total) by mouth daily. 06/11/21   Nafziger, Tommi Rumps, NP  Potassium 99 MG TABS Take 1 tablet by mouth as needed.    [provider]  predniSONE (DELTASONE) 10 MG tablet Take 1 tablet (10 mg total) by mouth daily. Take 4 pills daily for 3 days then 3 pills daily for 3 days then 2 pills daily for 3 days then continue taking 1 pill a day until you follow-up with your pulmonologist Patient not taking: Reported on 08/08/2021 06/30/21   Shelly Coss, MD  predniSONE (DELTASONE) 5 MG tablet Take 1 tablet (5 mg total) by mouth daily with breakfast. 07/09/21   Margaretha Seeds, MD  rosuvastatin (CRESTOR) 20 MG tablet Take 20 mg by mouth at bedtime. Patient not taking: Reported on 08/20/2021 03/27/21   [provider]  Spacer/Aero Chamber Mouthpiece MISC 1 Device by Does not apply route as directed. 01/12/17   Javier Glazier, MD  SYMBICORT 160-4.5 MCG/ACT inhaler INHALE 2 PUFFS TWICE  DAILY Patient taking differently: 2 puffs 2 (two) times daily. 12/31/20   Martyn Ehrich, NP  Tiotropium Bromide Monohydrate (SPIRIVA RESPIMAT) 2.5 MCG/ACT AERS Inhale 2 puffs into  the lungs daily. 12/06/20   Martyn Ehrich, NP    Physical Exam: Vitals:   08/26/21 8657 08/26/21 0551 08/26/21 0630 08/26/21 0645  BP:  110/80 102/72 97/68  Pulse:  93 94 93  Resp:  (!) 21 (!) 24 (!) 23  Temp:      TempSrc:      SpO2: 100% 100% 99% 97%  Weight:      Height:       Exam  Constitutional: Elderly female who appears ill but in no acute distress at this time Eyes: PERRL, post cataract surgery.  Patient reports seeing double when looking to the left and reports seeing double also when looking to the right ENMT: Mucous membranes are moist.  Neck: normal, supple, no masses.  No JVD. Respiratory: Decreased overall aeration with bilateral expiratory wheezes appreciated. Cardiovascular: Regular rate and rhythm, no murmurs / rubs / gallops. No extremity edema. 2+ pedal pulses. No carotid bruits.  Abdomen: no tenderness, no masses palpated. No hepatosplenomegaly. Bowel sounds positive.  Musculoskeletal: no clubbing / cyanosis. No joint deformity upper and lower extremities. Good ROM, no contractures. Normal muscle tone.  Skin: no rashes, lesions, ulcers. No induration Neurologic: CN 2-12 grossly intact.  Strength 5/5 in all 4.  Psychiatric: Normal judgment and insight. Alert and oriented x 3. Normal mood.   Data Reviewed:  EKG reveals sinus rhythm at 97 bpm with probable left atrial enlargement and abnormal lateral Q waves. Assessment and Plan:  Acute on chronic respiratory failure with hypoxia and hypercapnia secondary to COPD exacerbation Patient presents with complaints of acutely worsening shortness of breath with cough and wheezing.  On physical exam patient with decreased overall aeration and positive expiratory wheeze heard throughout both lung fields.  Initially requiring up to 10 L of  nasal cannula oxygen to maintain O2 saturations.  ABG noted pH is 7.397, PCO2 50.1, and PO2 95.  Chest x-ray noted concern for possible opacity of the right upper midlung that may be associated with scar seen on previous CT imaging from February of this year.  Patient was able to be weaned down to 4 L of nasal cannula oxygen with O2 saturations maintained currently.  She had been given Solu-Medrol 125 mg, magnesium sulfate 2 g IV, and and multiple breathing treatments with some improvement in symptoms.   -Admit to a progressive bed -Continuous pulse oximetry with oxygen to maintain O2 saturation greater than 92% -Follow-up ABG -Check procalcitonin -DuoNebs 4 times daily and albuterol as needed -Brovana and budesonide nebs substitution for Symbicort -Empiric antibiotics of azithromycin -Prednisone 60 mg p.o. every morning -Continue cetirizine and Mucinex  Subarachnoid hemorrhage secondary to syncope and collapse related to orthostatic hypotension Patient was found to have a trace subarachnoid hemorrhagealong the interhemispheric fissure on CT imaging of the brain after recent fall.  Case had been discussed with Dr. Venetia Constable of neurosurgery who did not recommend any further work-up.  Patient had reported that she has been having these intermittent episodes which she was evaluated by neurology and reported to have orthostatic hypotension. -Check orthostatic vital signs -Hold aspirin.  Resume when medically appropriate. -PT to eval and treat  SIRS Acute.  Patient was noted to be tachypneic with WBC elevated at 20.2.  White blood cell count however appears to be chronically elevated.  Lactic acid had not been initially obtained.   -Check procalcitonin  Elevated troponin Acute.  High-sensitivity troponins 76->322.  Patient with prior NSTEMI requiring cardiac cath 11/2020 which noted mild plaquing of the  proximal LAD with minimal luminal narrowing for which medical therapy was recommended and risk  factor modification.  EF at that time was reduced to 30-35%, but repeat check 1 month later was 55-60%.  EKG did note abnormal lateral Q waves.  At this time would avoid systemic anticoagulation given subarachnoid hemorrhage.  Suspect demand induced. -Continue to trend cardiac troponins -Check Echocardiogram -Case had been discussed with Dr. Angelena Form recommended holding diuretic. -Formally consult cardiology based off echocardiogram   Postconcussive syndrome After syncope and collapse patient had hit her head with unknown loss of consciousness.  Reports having persistent headache with decreased recent memory currently.  Findings are suggestive of a postconcussive syndrome. -Continue symptomatic treatment  Double vision For the last 2 weeks patient reported complaints of double vision when looking to the left.  Patient had 2 separate cataract surgeries on each eye in February and March of this year.  She had follow-up with the person who had performed her cataract surgery who reported possible concern for  eye stroke.  Patient had recently had MRI of the brain with and without contrast in February which showed no acute abnormality. -Check MRI of the brain without contrast -Formally consult neurology, if needed for further recommendations  Possible acute kidney injury Patient presents with creatinine of 1.3 with BUN 25.  Baseline creatinine previously had been around 0.8 earlier this year.  He had reported taking Lasix 40 mg daily. -Check urinalysis -Hold nephrotoxic agents -Normal saline IV fluids at 75 mL/h for 1 L  Essential hypertension Blood pressures initially elevated up to 140/79 while in the ED.  Home blood pressure regimen includes losartan 25 mg daily, metoprolol 12.5 mg daily, and furosemide 40 mg daily as needed for weight gain of 3 to 5 pounds. -Continue metoprolol and losartan as tolerated -Held furosemide  Heart failure with preserved EF Chronic.  Patient reports weight gain  of 7 pounds over the last week for which she had been taking furosemide 40 mg daily.  Last echocardiogram noted EF of 55 to 60% in 12/2020 after patient had suffered NSTEMI in 11/2020 with EF noted to be around 30-35%.  BNP was reassuring at 96.4.  She does not appear grossly fluid overloaded on physical exam. -Strict I&O's and daily weights -Add on TSH  Back and hip pain lumbar compression fractures osteoporosis X-rays noted chronic compression fractures of the thoracic spine with height loss.  X-rays of the pelvis did not note any acute abnormality. -Continue hydrocodone as needed for pain  Anxiety and depression Home medication regimen includes Paxil 30 mg daily and Xanax 0.5 mg 3 times daily as needed. -Continue current home regimen  DVT prophylaxis: SCDs Advance Care Planning:   Code Status: Full Code   Consults: No formal consults made at this time.  Family Communication: Husband updated at bedside  Severity of Illness: The appropriate patient status for this patient is INPATIENT. Inpatient status is judged to be reasonable and necessary in order to provide the required intensity of service to ensure the patient's safety. The patient's presenting symptoms, physical exam findings, and initial radiographic and laboratory data in the context of their chronic comorbidities is felt to place them at high risk for further clinical deterioration. Furthermore, it is not anticipated that the patient will be medically stable for discharge from the hospital within 2 midnights of admission.   * I certify that at the point of admission it is my clinical judgment that the patient will require inpatient hospital care spanning beyond  2 midnights from the point of admission due to high intensity of service, high risk for further deterioration and high frequency of surveillance required.*  Author: Norval Morton, MD 08/26/2021 7:32 AM  For on call review [email or web address].

## 2021-08-26 NOTE — Progress Notes (Signed)
Patient was noted to have rising troponin 423-> 676.  Echocardiogram revealed EF of 35-40% acutely decreased from last echo from 12/2020 that noted EF of 55 -60%.  Dr. Marlou Porch of cardiology consulted and they will evaluate the patient.  Held off anticoagulation due to concern for subarachnoid hemorrhage with recent fall on CT imaging.

## 2021-08-26 NOTE — Progress Notes (Signed)
ABG sample obtained on patient while wearing [place].  Patient tolerated well.  No complications and no further interventions at this time.  Will continue to monitor.    Latest Reference Range & Units 08/26/21 08:44  Sample type  ARTERIAL  pH, Arterial 7.35 - 7.45  7.397  pCO2 arterial 32 - 48 mmHg 50.1 (H)  pO2, Arterial 83 - 108 mmHg 95  TCO2 22 - 32 mmol/L 32  Acid-Base Excess 0.0 - 2.0 mmol/L 5.0 (H)  Bicarbonate 20.0 - 28.0 mmol/L 30.9 (H)  O2 Saturation % 97  Patient temperature  98.1 F  Collection site  RADIAL, ALLEN'S TEST ACCEPTABLE

## 2021-08-26 NOTE — Progress Notes (Signed)
Echocardiogram 2D Echocardiogram has been performed.  Kim [name] 08/26/2021, 2:48 PM

## 2021-08-26 NOTE — ED Provider Notes (Signed)
Discussed with Dr. Venetia Constable He has reviewed CT head. Patient does not need any further treatment or further imaging for the small subarachnoid hemorrhage   Ripley Fraise, MD 08/26/21 [phone]

## 2021-08-26 NOTE — Consult Note (Signed)
Neurosurgery Consultation  Reason for Consult: Traumatic ubarachnoid hemorrhage Referring Physician: Tamala Julian  CC: Shortness of breath  HPI: This is a 66 y.o. woman that presents with acute on chronic SOB. She did have a fall 3 days ago and struck her head, due to her usual syncopal-type episodes, struck her head on the handle of the drawer underneath the stove, +LOC, has had these for months without a clear diagnosis despite workup by neurologyo, per her report. Some headache around her left scalp contusion but otherwise no headache, feels her memory is slightly worse antegrade since the fall. No new weakness, numbness, or parasthesias. No recent use of anti-platelet or anti-coagulant medications except ASA81.    ROS: A 14 point ROS was performed and is negative except as noted in the HPI.   PMHx:  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   OSTEOARTHRITIS 06/05/2009   PARESTHESIA 08/24/2007   TOBACCO ABUSE 01/25/2010   FamHx:  Family History  Problem Relation Age of Onset   Cancer Mother        lung   Heart attack Mother    Hypertension Sister    Multiple sclerosis Brother    Diabetes Sister    Rheum arthritis Sister    Thyroid disease Sister    Multiple sclerosis Other    Alcohol abuse Other    Arthritis Other    Diabetes Other    Kidney disease Other    Cancer Other        lung,ovarian,skin, uterine   Stroke Other    Heart disease Other    Melanoma Other    Osteoporosis Other    Heart attack Maternal Uncle    Heart attack Other        NEICE   Heart attack Other        NEPHEW   Hypertension Brother    Stroke Maternal Grandmother    Colon cancer Neg Hx    SocHx:  reports that she quit smoking about 7 years ago. Her smoking use included cigarettes. She has a 76.00 pack-year smoking history. She has never used smokeless  tobacco. She reports that she does not drink alcohol and does not use drugs.  Exam: Vital signs in last 24 hours: Temp:  [98.1 F (36.7 C)] 98.1 F (36.7 C) (05/22 0417) Pulse Rate:  [88-100] 88 (05/22 1100) Resp:  [19-26] 19 (05/22 1100) BP: (90-140)/(57-80) 100/58 (05/22 1100) SpO2:  [97 %-100 %] 98 % (05/22 1100) Weight:  [65.3 kg] 65.3 kg (05/22 0429) General: Awake, alert, cooperative, lying in bed in NAD Head: Normocephalic, +left occipital scalp contusion HEENT: Neck supple Pulmonary: breathing supplemental O2 via [place] comfortably, no evidence of increased work of breathing Cardiac: RRR Abdomen: S NT ND Extremities: Warm and well perfused x4 Neuro: Aox3 but took 10 seconds or so before stating the correct year, PERRL, EOMI but does endorse some diplopia on extreme lateral gaze bilaterally that resolves with covering one eye, FS Strength 5/5 x4, SILTx4   Assessment and Plan: 66 y.o. woman s/p fall 3d ago. [place] personally reviewed, which shows some small volume parafalcine tSAH versus layering SDH, minimal on the right and small on the left at the convexity  -no acute neurosurgical intervention indicated at this time, no scheduled follow up with NSGY needed, no repeat imaging needed for the hemorrhage, but MRI pending -  presumed for the diplopia / syncope -syncope per primary, unrelated to her tSAH. 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

## 2021-08-26 NOTE — Consult Note (Addendum)
Cardiology Consultation:   Patient ID: Kim [name] MRN: [id number]; DOB: May 28, 1955  Admit date: 08/26/2021 Date of Consult: 08/26/2021  PCP:  [name], NP   [facility] Providers Cardiologist:  Lauree Chandler, MD   {   Patient Profile:   Kim [name] is a 66 y.o. female with a hx of Takotsubo cardiomyopathy with improved LV function, chronic hypoxic respiratory failure on 2 to 3 L oxygen, COPD, hypertension and hyperlipidemia who is being seen 08/26/2021 for the evaluation of elevated troponin at the request of Dr. Tamala Julian.  Coronary artery disease  Cath in 2017: min non-obs CAD Cath 8/22: no CAD  HFimpEF (heart failure with improved ejection fraction)  Non-ischemic cardiomyopathy  Cath 3/17: EF 20-25 Echocardiogram 5/18: normal EF Echocardiogram 4/21: Normal EF  Tako-tsubo CM Admx 8/22 >> +Trop, Echo: EF 30-35, apical WMA // no CAD on cath Echocardiogram 9/22: EF 55-60   Patient was last seen by Dr. Angelena Form November 2022.  History of Present Illness:   Ms. Mcdanel had a history of syncope December 2022.  She was walking and walk up on the floor.  Work-up reassuring by neurology.  Patient reported dizzy spell and blurry eye after cataract surgery.  Patient being followed by neurology for this.  Normal EEG and MRI of brain.  She was also found orthostatic with systolic blood pressure dropping for 30 points.  No medication adjustment was done.  3 days ago patient had a loss of consciousness.  She was folding laundry and then reached out to get banana on countertop.  Next thing remember waking up on the floor.  No reported prodromal symptoms.  Complaining of headache afterwards.  This morning patient had worsening shortness of breath and panic attack.  EMS was called and brought to ER for further evaluation.  CT scan of the head significant for trace subarachnoid hemorrhage along the interhemispheric fissure.  Patient also treated for COPD exacerbation.   Cardiology is asked for evaluation of elevated troponin. 311>>[id number]>>676. Hemoglobin 11.6.   Patient reported intermittent fluttering sensation with palpitation for past few weeks.  Each episode lasting for 10 minutes.  Has associated shortness of breath.   Echo 08/26/21 1. Left ventricular ejection fraction, by estimation, is 35 to 40%. The  left ventricle has moderately decreased function. The left ventricle  demonstrates regional wall motion abnormalities (see scoring  diagram/findings for description). There is  hypokinesis of all mid LV segments although appears slightly worse in the  mid septal LV segments. The apical LV segments appear hyperkinetic. Wall  motion suggestive of atypical stress cardiomyopathy as does not appear to  follow coronary artery pattern.  Clinical correlation recommended.   2. Left ventricular diastolic parameters are consistent with Grade I  diastolic dysfunction (impaired relaxation).   3. Right ventricular systolic function is normal. The right ventricular  size is normal.   4. The mitral valve is normal in structure. Trivial mitral valve  regurgitation.   5. The aortic valve was not well visualized. Aortic valve regurgitation  is mild. Aortic valve sclerosis/calcification is present, without any  evidence of aortic stenosis.   6. The inferior vena cava is normal in size with greater than 50%  respiratory variability, suggesting right atrial pressure of 3 mmHg.   Comparison(s): Compared to prior TTE in 11/2020, there is now hyperkinesis  of the LV apical segments (previously akinetic) and hypokinesis of the mid  LV segments. EF now 35-40% (previously 30-35%). Again, findings most  consistent with atypical stress  cardiomyopathy.   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   OSTEOARTHRITIS 06/05/2009    PARESTHESIA 08/24/2007   TOBACCO ABUSE 01/25/2010    Past Surgical History:  Procedure Laterality Date   ABDOMINAL HYSTERECTOMY     APPENDECTOMY  1969   CARDIAC CATHETERIZATION N/A 07/05/2015   Procedure: Left Heart Cath and Coronary Angiography;  Surgeon: Leonie Man, MD;  Location: [facility];  Service: Cardiovascular;  Laterality: N/A;   CHOLECYSTECTOMY  1989   collapse lung  1990   COLONOSCOPY WITH PROPOFOL N/A 05/14/2018   Procedure: COLONOSCOPY WITH PROPOFOL;  Surgeon: Irene Shipper, MD;  Location: [facility];  Service: Endoscopy;  Laterality: N/A;   EXCISION MASS ABDOMINAL  2019   EXPLORATORY LAPAROTOMY  1969   LEFT HEART CATH AND CORONARY ANGIOGRAPHY N/A 11/19/2020   Procedure: LEFT HEART CATH AND CORONARY ANGIOGRAPHY;  Surgeon: [name], MD;  Location: [facility];  Service: Cardiovascular;  Laterality: N/A;   POLYPECTOMY  05/14/2018   Procedure: POLYPECTOMY;  Surgeon: Irene Shipper, MD;  Location: [facility];  Service: Endoscopy;;   SALPINGOOPHORECTOMY  2019     Inpatient Medications: Scheduled Meds:  arformoterol  15 mcg Nebulization BID   [START ON 08/27/2021] azithromycin  500 mg Oral Daily   budesonide (PULMICORT) nebulizer solution  0.5 mg Nebulization BID   guaiFENesin  1,200 mg Oral Q12H   ipratropium-albuterol  3 mL Nebulization QID   lidocaine  1 patch Transdermal Q24H   loratadine  10 mg Oral Daily   losartan  25 mg Oral Daily   magnesium oxide  400 mg Oral Daily   metoprolol succinate  12.5 mg Oral Daily   olopatadine  1 drop Left Eye Daily   PARoxetine  30 mg Oral Daily   [START ON 08/27/2021] predniSONE  60 mg Oral Q breakfast   sodium chloride flush  3 mL Intravenous Q12H   Continuous Infusions:  PRN Meds: acetaminophen **OR** acetaminophen, albuterol, ALPRAZolam, butalbital-acetaminophen-caffeine, hydrALAZINE, HYDROcodone-acetaminophen, LORazepam, nystatin  Allergies:    Allergies  Allergen Reactions   Tape Other (See  Comments)    Skin tears easily, peels skin off   Daliresp [Roflumilast]     Skin peeling   Latex Other (See Comments)    Peels skin off   Lisinopril Cough   Morphine And Related     hallucinations   Penicillins Hives and Other (See Comments)    Tolerates Omnicef Has patient had a PCN reaction causing immediate rash, facial/tongue/throat swelling, SOB or lightheadedness with hypotension: Yes Has patient had a PCN reaction causing severe rash involving mucus membranes or skin necrosis: No Has patient had a PCN reaction that required hospitalization No Has patient had a PCN reaction occurring within the last 10 years: No If all of the above answers are "NO", then may proceed with Cephalosporin use.    Omnicef [Cefdinir] Rash    Social History:   Social History   Socioeconomic History   Marital status: Married    Spouse name: [name]   Number of children: 2   Years of education: Not on file   Highest education level: Not on file  Occupational History   Occupation: Patient care aide-sits with elderly lady  Tobacco Use   Smoking status: Former    Packs/day: 2.00    Years: 38.00  Pack years: 76.00    Types: Cigarettes    Quit date: 03/07/2014    Years since quitting: 7.4   Smokeless tobacco: Never  Vaping Use   Vaping Use: Never used  Substance and Sexual Activity   Alcohol use: No    Alcohol/week: 0.0 standard drinks   Drug use: No   Sexual activity: Not on file  Other Topics Concern   Not on file  Social History Narrative   [facility] (01/12/17):   Originally from [facility]. Has lived in [place] for 17 years. Married. Has 2 dogs currently. No mold and only 1 indoor plant. Had her home professionally cleaned a month ago. No bird or hot tub exposure. 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    Right handed    Social Determinants of Health   Financial Resource Strain: Low Risk    Difficulty of Paying Living Expenses: Not hard at all  Food  Insecurity: No Food Insecurity   Worried About Charity fundraiser in the Last Year: Never true   Ran Out of Food in the Last Year: Never true  Transportation Needs: No Transportation Needs   Lack of Transportation (Medical): No   Lack of Transportation (Non-Medical): No  Physical Activity: Inactive   Days of Exercise per Week: 0 days   Minutes of Exercise per Session: 0 min  Stress: No Stress Concern Present   Feeling of Stress : Not at all  Social Connections: Moderately Isolated   Frequency of Communication with Friends and Family: More than three times a week   Frequency of Social Gatherings with Friends and Family: More than three times a week   Attends Religious Services: Never   Marine scientist or Organizations: No   Attends Music therapist: Never   Marital Status: Married  Human resources officer Violence: Not At Risk   Fear of Current or Ex-Partner: No   Emotionally Abused: No   Physically Abused: No   Sexually Abused: No    Family History:   Family History  Problem Relation Age of Onset   Cancer Mother        lung   Heart attack Mother    Hypertension Sister    Multiple sclerosis Brother    Diabetes Sister    Rheum arthritis Sister    Thyroid disease Sister    Multiple sclerosis Other    Alcohol abuse Other    Arthritis Other    Diabetes Other    Kidney disease Other    Cancer Other        lung,ovarian,skin, uterine   Stroke Other    Heart disease Other    Melanoma Other    Osteoporosis Other    Heart attack Maternal Uncle    Heart attack Other        [place]   Heart attack Other        NEPHEW   Hypertension Brother    Stroke Maternal Grandmother    Colon cancer Neg Hx      ROS:  Please see the history of present illness.  All other ROS reviewed and negative.     Physical Exam/Data:   Vitals:   08/26/21 1045 08/26/21 1100 08/26/21 1400 08/26/21 1641  BP: (!) 100/57 (!) 100/58 103/66 (!) 169/129  Pulse: 88 88 91 92  Resp: (!) '26  19 20 12  '$ Temp:    97.7 F (36.5 C)  TempSrc:    Oral  SpO2:  97% 98% 98% 99%  Weight:      Height:        Intake/Output Summary (Last 24 hours) at 08/26/2021 1752 Last data filed at 08/26/2021 0700 Gross per 24 hour  Intake 50 ml  Output --  Net 50 ml      08/26/2021    4:29 AM 08/20/2021   11:44 AM 08/08/2021    9:40 AM  Last 3 Weights  Weight (lbs) 144 lb 139 lb 12.8 oz 140 lb 6.4 oz  Weight (kg) 65.318 kg 63.413 kg 63.685 kg     Body mass index is 27.21 kg/m.  General:  Well nourished, well developed, in no acute distress HEENT: normal Neck: no JVD Vascular: No carotid bruits; Distal pulses 2+ bilaterally Cardiac:  normal S1, S2; RRR; no murmur  Lungs:  clear to auscultation bilaterally, no wheezing, rhonchi or rales  Abd: soft, nontender, no hepatomegaly  Ext: no edema Musculoskeletal:  No deformities, BUE and BLE strength normal and equal Skin: warm and dry  Neuro:  CNs 2-12 intact, no focal abnormalities noted Psych:  Normal affect   EKG:  The EKG was personally reviewed and demonstrates:  Sinus rhythm Telemetry:  Telemetry was personally reviewed and demonstrates:  Sinus rhythm  Relevant CV Studies:  LEFT HEART CATH AND CORONARY ANGIOGRAPHY 11/2020   Conclusion  Conclusions: Mild plaquing of the proximal LAD with minimal luminal narrowing.  Otherwise, no angiographically significant coronary artery disease.  Findings are similar to prior catheterization in 2017 and presumably due to stress-induced cardiomyopathy.   Recommendations: Medical therapy and risk factor modification to prevent progression of coronary artery disease. Escalate goal-directed medical therapy for treatment of non-ischemic cardiomyopathy (presumably stress-induced).    Echo 12/2020 1. The LV function has significantly improved since the previous echo in  Aug. 2022. The Takotsubo type contractility has normalized.      . Left ventricular ejection fraction, by estimation, is 55 to 60%.   Left ventricular ejection fraction by 3D volume is 55 %. The left  ventricle has normal function. The left ventricle has no regional wall  motion abnormalities. Left ventricular  diastolic parameters were normal.   2. Right ventricular systolic function is normal. The right ventricular  size is normal.   3. The mitral valve is grossly normal. Trivial mitral valve  regurgitation.   4. The aortic valve is grossly normal. Aortic valve regurgitation is  moderate. No aortic stenosis is present.   Laboratory Data:  High Sensitivity Troponin:   Recent Labs  Lab 08/26/21 0415 08/26/21 0558 08/26/21 0904 08/26/21 1543  TROPONINIHS 76* 311* 423* 676*     Chemistry Recent Labs  Lab 08/20/21 1215 08/26/21 0415 08/26/21 0844  NA 140 141 137  K 4.0 4.3 4.0  CL 99 101  --   CO2 33* 33*  --   GLUCOSE 98 123*  --   BUN 26* 25*  --   CREATININE 1.16 1.30*  --   CALCIUM 9.8 9.7  --   GFRNONAA  --  45*  --   ANIONGAP  --  7  --     No results for input(s): PROT, ALBUMIN, AST, ALT, ALKPHOS, BILITOT in the last 168 hours. Lipids No results for input(s): CHOL, TRIG, HDL, LABVLDL, LDLCALC, CHOLHDL in the last 168 hours.  Hematology Recent Labs  Lab 08/26/21 0415 08/26/21 0844  WBC 20.2*  --   RBC 4.07  --   HGB 13.0 11.6*  HCT 40.2 34.0*  MCV 98.8  --  MCH 31.9  --   MCHC 32.3  --   RDW 14.3  --   PLT 337  --    Thyroid  Recent Labs  Lab 08/26/21 1543  TSH 0.373    BNP Recent Labs  Lab 08/26/21 0415  BNP 96.4    DDimer No results for input(s): DDIMER in the last 168 hours.   Radiology/Studies:  DG Thoracic Spine 2 View  Result Date: 08/26/2021 CLINICAL DATA:  Pain. EXAM: THORACIC SPINE 2 VIEWS COMPARISON:  None Available. FINDINGS: Two-view exam is limited by osteopenia. Loss of vertebral body height noted at 2 levels in the midthoracic spine likely T6 and T7, proximally 25% loss of height anteriorly at both levels. Features are age indeterminate by x-ray. No  abnormal paraspinal line on the frontal projection. Possible sclerosis involving the right pedicle of the T7 vertebral body on the frontal projection. IMPRESSION: Loss of vertebral body height at 2 levels in the midthoracic spine, likely T6 and T7, age indeterminate by x-ray. Possible sclerosis of the right T7 pedicle. MRI thoracic spine could be used to further evaluate as clinically warranted. Electronically Signed   By: Misty Stanley M.D.   On: 08/26/2021 05:59   DG Lumbar Spine Complete  Result Date: 08/26/2021 CLINICAL DATA:  Pain. EXAM: LUMBAR SPINE - COMPLETE 4+ VIEW COMPARISON:  None Available. FINDINGS: Bones are demineralized. No evidence for lumbar spine fracture or subluxation. Mild loss of disc height noted at L3-4, L4-5 common L5-S1. Mild bilateral facet degeneration noted in the lower lumbar spine. Atherosclerotic calcification characterizes the abdominal aorta. IMPRESSION: Degenerative changes in the lower lumbar spine without acute bony abnormality. Aortic Atherosclerois (ICD10-170.0) Electronically Signed   By: Misty Stanley M.D.   On: 08/26/2021 06:01   CT Head Wo Contrast  Addendum Date: 08/26/2021   ADDENDUM REPORT: 08/26/2021 05:21 ADDENDUM: Study discussed by telephone with Dr. Ripley Fraise on 08/26/2021 at 0518 hours. Electronically Signed   By: Genevie Ann M.D.   On: 08/26/2021 05:21   Result Date: 08/26/2021 CLINICAL DATA:  66 year old female with dizziness, hypertensive (systolic 301S). Fall 3 days ago. Diaphoretic. EXAM: CT HEAD WITHOUT CONTRAST TECHNIQUE: Contiguous axial images were obtained from the base of the skull through the vertex without intravenous contrast. RADIATION DOSE REDUCTION: This exam was performed according to the departmental dose-optimization program which includes automated exposure control, adjustment of the mA and/or kV according to patient size and/or use of iterative reconstruction technique. COMPARISON:  Brain MRI 05/12/2021.  Paranasal sinus CT  10/28/2016. FINDINGS: Brain: Small volume hyperdense blood along the interhemispheric fissure, left greater than right (series 3, images 24 and 27), most likely in the subarachnoid space. No dural thickening or nodularity of the falx on February MRI. No IVH or ventriculomegaly. Basilar cisterns appear to remain normal. Fetal type PCA origins again noted. No blood along the tentorium or elsewhere. No superimposed midline shift, mass effect, evidence of mass lesion, or evidence of cortically based acute infarction. Gray-white matter differentiation is within normal limits throughout the brain. Vascular: Calcified atherosclerosis at the skull base. No suspicious intracranial vascular hyperdensity. Skull: No fracture identified. Sinuses/Orbits: Visualized paranasal sinuses and mastoids are clear. Other: No acute orbit or scalp soft tissue injury identified. IMPRESSION: 1. Positive for trace Subarachnoid Hemorrhage along the interhemispheric fissure. Favor sequelae of trauma given pattern of blood and recent fall. 2. No skull fracture or other acute traumatic injury to the brain identified. 3. Otherwise negative for age non contrast CT appearance of the brain. Electronically  Signed: By: Genevie Ann M.D. On: 08/26/2021 05:15   CT Cervical Spine Wo Contrast  Result Date: 08/26/2021 CLINICAL DATA:  66 year old female with dizziness, hypertensive (systolic 335K). Fall 3 days ago. Diaphoretic. EXAM: CT CERVICAL SPINE WITHOUT CONTRAST TECHNIQUE: Multidetector CT imaging of the cervical spine was performed without intravenous contrast. Multiplanar CT image reconstructions were also generated. RADIATION DOSE REDUCTION: This exam was performed according to the departmental dose-optimization program which includes automated exposure control, adjustment of the mA and/or kV according to patient size and/or use of iterative reconstruction technique. COMPARISON:  Head CT today reported separately. FINDINGS: Alignment: Straightening  and mild reversal of cervical lordosis. Cervicothoracic junction alignment is within normal limits. Bilateral posterior element alignment is within normal limits. Skull base and vertebrae: Visualized skull base is intact. No atlanto-occipital dissociation. C1 and C2 appear intact and aligned. Intermittent mild motion artifact. No acute osseous abnormality identified. Soft tissues and spinal canal: No prevertebral fluid or swelling. No visible canal hematoma. Partially retropharyngeal course of both carotids in the neck (normal variant). Otherwise negative visible noncontrast neck soft tissues. Disc levels: Chronic cervical spine disc and endplate degeneration. No convincing cervical spinal stenosis. Upper chest: Osteopenia. Grossly intact visible upper thoracic levels. Emphysema. Retained secretions in the trachea at the thoracic inlet. IMPRESSION: 1. No acute traumatic injury identified in the cervical spine. 2. Emphysema (ICD10-J43.9). Retained secretions in the trachea at the thoracic inlet, Aspiration not excluded. Electronically Signed   By: Genevie Ann M.D.   On: 08/26/2021 05:19   MR BRAIN WO CONTRAST  Result Date: 08/26/2021 CLINICAL DATA:  [place]iplopia EXAM: MRI HEAD WITHOUT CONTRAST TECHNIQUE: Multiplanar, multiecho pulse sequences of the brain and surrounding structures were obtained without intravenous contrast. COMPARISON:  CT 08/26/2021, MRI 05/12/2021 FINDINGS: Brain: There is abnormal diffusion hyperintensity with corresponding susceptibility the parasagittal sulci along the falx reflecting subarachnoid hemorrhage seen on the prior CT. No acute infarction. Ventricles and sulci are stable in size and configuration. Patchy foci of T2 hyperintensity in the supratentorial white matter are nonspecific but may reflect stable chronic microvascular ischemic changes. No mass or mass effect. There is no extra-axial collection. Vascular: Major vessel flow voids at the skull base are preserved. Skull and upper  cervical spine: Normal marrow signal is preserved. Sinuses/Orbits: Paranasal sinuses are aerated. Bilateral lens replacements. Other: Sella is unremarkable.  Mastoid air cells are clear. IMPRESSION: Abnormal signal along the falx corresponding to subarachnoid hemorrhage on CT. Otherwise no significant change since prior study. Electronically Signed   By: Macy Mis M.D.   On: 08/26/2021 15:24   DG Chest Port 1 View  Result Date: 08/26/2021 CLINICAL DATA:  Shortness of breath and chest pain. EXAM: PORTABLE CHEST 1 VIEW COMPARISON:  Portable chest 06/29/2021. FINDINGS: Heart size and vascular pattern are normal. There is aortic atherosclerosis in the transverse segment, unremarkable mediastinal configuration. No vascular congestion is seen. The lungs emphysematous. There is an irregular 2 x 1.1 cm opacity developed in the right upper lobe mid perihilar area. The remaining lungs clear. No pleural collection is seen. There is thoracic spondylosis. IMPRESSION: 2 x 1.1 cm irregular opacity developed, right upper lobe mid perihilar area. The last chest CT was 05/08/2021 and demonstrated perifissural scar-like thickening in the right upper lung field extending along the major fissure. I suspect this is probably being seen in summation given the apical lordotic positioning on this study. A small infiltrate or less likely mass are both possible in a high-risk patient. Chest CT could rule out  mass if needed or a short interval follow-up study could be considered to see if this persists. Electronically Signed   By: Telford Nab M.D.   On: 08/26/2021 06:10   ECHOCARDIOGRAM COMPLETE  Result Date: 08/26/2021    ECHOCARDIOGRAM REPORT   Patient Name:   KAARI ZEIGLER Date of Exam: 08/26/2021 Medical Rec #:  [id number]       Height:       61.0 in Accession #:    [id number]      Weight:       144.0 lb Date of Birth:  12/19/1955      BSA:          1.643 m Patient Age:    39 years        BP:           105/69 mmHg Patient  Gender: F               HR:           91 bpm. Exam Location:  Inpatient Procedure: 2D Echo, Cardiac Doppler, Color Doppler and Intracardiac            Opacification Agent Indications:    Elevated troponin  History:        Patient has prior history of Echocardiogram examinations, most                 recent 12/19/2020. COPD.  Sonographer:    Joette Catching RCS Referring Phys: [phone] RONDELL A SMITH  Sonographer Comments: Technically difficult study due to poor echo windows. Image acquisition challenging due to COPD. IMPRESSIONS  1. Left ventricular ejection fraction, by estimation, is 35 to 40%. The left ventricle has moderately decreased function. The left ventricle demonstrates regional wall motion abnormalities (see scoring diagram/findings for description). There is hypokinesis of all mid LV segments although appears slightly worse in the mid septal LV segments. The apical LV segments appear hyperkinetic. 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 Clinical correlation recommended.  2. Left ventricular diastolic parameters are consistent with Grade I diastolic dysfunction (impaired relaxation).  3. Right ventricular systolic function is normal. The right ventricular size is normal.  4. The mitral valve is normal in structure. Trivial mitral valve regurgitation.  5. The aortic valve was not well visualized. Aortic valve regurgitation is mild. Aortic valve sclerosis/calcification is present, without any evidence of aortic stenosis.  6. The inferior vena cava is normal in size with greater than 50% respiratory variability, suggesting right atrial pressure of 3 mmHg. Comparison(s): Compared to prior TTE in 11/2020, there is now hyperkinesis of the LV apical segments (previously akinetic) and hypokinesis of the mid LV segments. EF now 35-40% (previously 30-35%). 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 FINDINGS  Left  Ventricle: There is hypokinesis of all mid LV segments although appears slightly worse in the mid septal LV segments. The apical LV segments appear hyperkinetic. 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 Clinical correlation recommended. Left ventricular ejection fraction, by estimation, is 35 to 40%. The left ventricle has moderately decreased function. The left ventricle demonstrates regional wall motion abnormalities. Definity  contrast agent was given IV to delineate the left ventricular endocardial borders. The left ventricular internal cavity size was normal in size. There is no left ventricular hypertrophy. Left ventricular diastolic parameters are consistent with Grade I diastolic dysfunction (impaired relaxation). Right Ventricle: The right  ventricular size is normal. No increase in right ventricular wall thickness. Right ventricular systolic function is normal. Left Atrium: Left atrial size was normal in size. Right Atrium: Right atrial size was normal in size. Pericardium: There is no evidence of pericardial effusion. Mitral Valve: The mitral valve is normal in structure. Trivial mitral valve regurgitation. Tricuspid Valve: The tricuspid valve is normal in structure. Tricuspid valve regurgitation is not demonstrated. Aortic Valve: The aortic valve was not well visualized. Aortic valve regurgitation is mild. Aortic regurgitation PHT measures 275 msec. Aortic valve sclerosis/calcification is present, without any evidence of aortic stenosis. Aortic valve mean gradient measures 4.5 mmHg. Aortic valve peak gradient measures 8.7 mmHg. Aortic valve area, by VTI measures 1.99 cm. Pulmonic Valve: The pulmonic valve was not well visualized. Aorta: The aortic root and ascending aorta are structurally normal, with no evidence of dilitation. Venous: The inferior vena cava is normal in size with greater than 50% respiratory variability, suggesting  right atrial pressure of 3 mmHg. IAS/Shunts: The atrial septum is grossly normal.  LEFT VENTRICLE PLAX 2D LVIDd:         4.80 cm      Diastology LVIDs:         3.70 cm      LV e' medial:    6.31 cm/s LV PW:         1.00 cm      LV E/e' medial:  11.4 LV IVS:        0.90 cm      LV e' lateral:   5.33 cm/s LVOT diam:     1.90 cm      LV E/e' lateral: 13.5 LV SV:         52 LV SV Index:   32 LVOT Area:     2.84 cm  LV Volumes (MOD) LV vol d, MOD A2C: 120.0 ml LV vol d, MOD A4C: 114.0 ml LV vol s, MOD A2C: 83.0 ml LV vol s, MOD A4C: 66.6 ml LV SV MOD A2C:     37.0 ml LV SV MOD A4C:     114.0 ml LV SV MOD BP:      46.3 ml RIGHT VENTRICLE RV S prime:     12.80 cm/s TAPSE (M-mode): 2.2 cm LEFT ATRIUM             Index LA diam:        2.70 cm 1.64 cm/m LA Vol (A2C):   22.3 ml 13.58 ml/m LA Vol (A4C):   12.0 ml 7.31 ml/m LA Biplane Vol: 17.8 ml 10.84 ml/m  AORTIC VALVE                     PULMONIC VALVE AV Area (Vmax):    2.06 cm      PV Vmax:       1.26 m/s AV Area (Vmean):   2.06 cm      PV Peak grad:  6.4 mmHg AV Area (VTI):     1.99 cm AV Vmax:           147.50 cm/s AV Vmean:          103.800 cm/s AV VTI:            0.263 m AV Peak Grad:      8.7 mmHg AV Mean Grad:      4.5 mmHg LVOT Vmax:         107.00 cm/s LVOT Vmean:  75.400 cm/s LVOT VTI:          0.185 m LVOT/AV VTI ratio: 0.70 AI PHT:            275 msec  AORTA Ao Root diam: 2.80 cm Ao Asc diam:  2.60 cm MITRAL VALVE                TRICUSPID VALVE MV Area (PHT): 8.92 cm     TR Peak grad:   26.0 mmHg MV Decel Time: 85 msec      TR Vmax:        255.00 cm/s MV E velocity: 72.20 cm/s MV A velocity: 103.00 cm/s  SHUNTS MV E/A ratio:  0.70         Systemic VTI:  0.18 m                             Systemic Diam: 1.90 cm Gwyndolyn Kaufman MD Electronically signed by Gwyndolyn Kaufman MD Signature Date/Time: 08/26/2021/4:08:17 PM    Final      Assessment and Plan:   Syncope -Unknown etiology.  Patient with history of orthostatic hypotension.  Reported  intermittent palpitation/fluttering sensation.  Watch for arrhythmia on monitor.  Will need PT OT with orthostatic vitals. -Continue losartan 25 mg daily and Toprol-XL 12.5 mg daily for now -Will need monitor at discharge  2.  Elevated troponin -After having panic attack this morning.  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 -No reported chest pain -Thankfully, recent cardiac catheterization 11/2020 without evidence of obstructive CAD -No plan for ischemic evaluation  3.  Takotsubo cardiomyopathy -Prior history of Takotsubo cardiomyopathy with normalization of LV function.  No evidence of CAD by cath 11/2020. 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 -We will add guideline directed medical therapy as tolerated given orthostatic hypotension -Euvolemic on exam -We will consider repeat limited echocardiogram in few weeks  4.  Subdural hematoma -Pending neurosurgical evaluation  5.  Double vision 6.  COPD exasperation 7.  Acute on chronic hypoxic respiratory failure -Per primary team  Risk Assessment/Risk Scores:        New [place] Heart Association (NYHA) Functional Class NYHA Class I        For questions or updates, please contact [facility] Please consult [email or web address] for contact info under    Jarrett Soho, PA  08/26/2021 5:52 PM   Personally seen and examined. Agree with above.  66 year old with multiple medical issues of high complexity with elevated troponin, subdural hematoma, recent syncope possible orthostasis, fluctuating ejection fraction as low as 20 to 25%.  We will continue with guideline directed medical therapy as tolerated.  She appears euvolemic.  Repeat echocardiogram in the next several weeks.  Recent cardiac catheterization with no obstructive disease.  Does not need a further ischemic evaluation.  Troponin elevation  likely myocardial injury in the setting of underlying medical conditions.  Would suggest monitor at discharge.  Candee Furbish, MD

## 2021-08-26 NOTE — ED Notes (Signed)
ED TO INPATIENT HANDOFF REPORT  ED Nurse Name and Phone #: Lysbeth Galas [phone]  S Name/Age/Gender Kim [name] 66 y.o. female Room/Bed: 023C/023C  Code Status   Code Status: Full Code  Home/SNF/Other Home Patient oriented to: self, place, time, and situation Is this baseline? Yes   Triage Complete: Triage complete  Chief Complaint COPD with acute exacerbation ([place]) [J44.1]  Triage Note Pt bib [facility] from home c/o SOB and increased BP. Pt woke up to go to bathroom, felt dizzy, checked BP (200s sys). Took 3 albuterol treatments prior to EMS arrival with no relief. '125mg'$  solumedrol + duoneb given by EMS. BP 156/100, pt diaphoretic, 100% 6L [place], 2L [place] at baseline. Pt reported falling on Friday, endorses hitting head, pt ws waiting to come in on Monday. 18G LAC   Allergies Allergies  Allergen Reactions   Tape Other (See Comments)    Skin tears easily, peels skin off   Daliresp [Roflumilast]     Skin peeling   Latex Other (See Comments)    Peels skin off   Lisinopril Cough   Morphine And Related     hallucinations   Penicillins Hives and Other (See Comments)    Tolerates Omnicef Has patient had a PCN reaction causing immediate rash, facial/tongue/throat swelling, SOB or lightheadedness with hypotension: Yes Has patient had a PCN reaction causing severe rash involving mucus membranes or skin necrosis: No Has patient had a PCN reaction that required hospitalization No Has patient had a PCN reaction occurring within the last 10 years: No If all of the above answers are "NO", then may proceed with Cephalosporin use.    Omnicef [Cefdinir] Rash    Level of Care/Admitting Diagnosis ED Disposition     ED Disposition  Admit   Condition  --   [place]: [place] [[id number]]  Level of Care: Progressive [102]  Admit to Progressive based on following criteria: RESPIRATORY PROBLEMS hypoxemic/hypercapnic respiratory failure that is responsive to NIPPV  (BiPAP) or High Flow Nasal Cannula (6-80 lpm). Frequent assessment/intervention, no > Q2 hrs < Q4 hrs, to maintain oxygenation and pulmonary hygiene.  May admit patient to [facility] or [facility] if equivalent level of care is available:: No  Covid Evaluation: Asymptomatic - no recent exposure (last 10 days) testing not required  Diagnosis: COPD with acute exacerbation [facility]) [[id number]]  Admitting Physician: Vernelle Emerald [[id number]]  Attending Physician: Vernelle Emerald [[id number]]  Estimated length of stay: 3 - 4 days  Certification:: I certify this patient will need inpatient services for at least 2 midnights          B Medical/Surgery History 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   OSTEOARTHRITIS 06/05/2009   PARESTHESIA 08/24/2007   TOBACCO ABUSE 01/25/2010   Past Surgical History:  Procedure Laterality Date   ABDOMINAL HYSTERECTOMY     [place] N/A 07/05/2015   Procedure: Left Heart Cath and Coronary Angiography;  Surgeon: Leonie Man, MD;  Location: [place] [facility];  Service: Cardiovascular;  Laterality: N/A;   CHOLECYSTECTOMY  1989   collapse lung  1990   COLONOSCOPY WITH PROPOFOL N/A 05/14/2018   Procedure: COLONOSCOPY WITH PROPOFOL;  Surgeon: Irene Shipper, MD;  Location: [facility];  Service: Endoscopy;  Laterality: N/A;   EXCISION MASS ABDOMINAL  2019   [place]   LEFT HEART CATH AND CORONARY ANGIOGRAPHY N/A 11/19/2020   Procedure: LEFT HEART CATH AND CORONARY ANGIOGRAPHY;  Surgeon: [name], MD;  Location: [place] [facility];  Service: Cardiovascular;  Laterality: N/A;   POLYPECTOMY  05/14/2018   Procedure: POLYPECTOMY;  Surgeon: Irene Shipper, MD;  Location: [facility];  Service: Endoscopy;;   SALPINGOOPHORECTOMY  2019     A IV  Location/Drains/Wounds Patient Lines/Drains/Airways Status     Active Line/Drains/Airways     Name Placement date Placement time Site Days   Peripheral IV 08/26/21 18 G Left Antecubital 08/26/21  --  Antecubital  less than 1   Peripheral IV 08/26/21 18 G Anterior;Right Forearm 08/26/21  0421  Forearm  less than 1            Intake/Output Last 24 hours  Intake/Output Summary (Last 24 hours) at 08/26/2021 1458 Last data filed at 08/26/2021 0700 Gross per 24 hour  Intake 50 ml  Output --  Net 50 ml    Labs/Imaging Results for orders placed or performed during the hospital encounter of 08/26/21 (from the past 48 hour(s))  CBC     Status: Abnormal   Collection Time: 08/26/21  4:15 AM  Result Value Ref Range   WBC 20.2 (H) 4.0 - 10.5 K/uL   RBC 4.07 3.87 - 5.11 MIL/uL   Hemoglobin 13.0 12.0 - 15.0 g/dL   HCT 40.2 36.0 - 46.0 %   MCV 98.8 80.0 - 100.0 fL   MCH 31.9 26.0 - 34.0 pg   MCHC 32.3 30.0 - 36.0 g/dL   RDW 14.3 11.5 - 15.5 %   Platelets 337 150 - 400 K/uL   nRBC 0.0 0.0 - 0.2 %    Comment: Performed at [facility], [place] [address], [place], [place] [address]  Basic metabolic panel     Status: Abnormal   Collection Time: 08/26/21  4:15 AM  Result Value Ref Range   Sodium 141 135 - 145 mmol/L   Potassium 4.3 3.5 - 5.1 mmol/L   Chloride 101 98 - 111 mmol/L   CO2 33 (H) 22 - 32 mmol/L   Glucose, Bld 123 (H) 70 - 99 mg/dL    Comment: Glucose reference range applies only to samples taken after fasting for at least 8 hours.   BUN 25 (H) 8 - 23 mg/dL   Creatinine, Ser 1.30 (H) 0.44 - 1.00 mg/dL   Calcium 9.7 8.9 - 10.3 mg/dL   GFR, Estimated 45 (L) >60 mL/min    Comment: (NOTE) Calculated using the CKD-EPI Creatinine Equation (2021)    Anion gap 7 5 - 15    Comment: Performed at [facility] [address] [place], [place] [address]  Troponin I (High Sensitivity)     Status: Abnormal   Collection Time: 08/26/21  4:15 AM  Result Value Ref Range    Troponin I (High Sensitivity) 76 (H) <18 ng/L    Comment: (NOTE) Elevated high sensitivity troponin I (hsTnI) values and significant  changes across serial measurements may suggest ACS but many other  chronic and acute conditions are known to elevate hsTnI results.  Refer to the "Links" section for chest pain algorithms and additional  guidance. Performed at [facility], [place] [address] [place], [place] [address]   Brain natriuretic peptide     Status: None   Collection Time: 08/26/21  4:15 AM  Result Value Ref Range  B Natriuretic Peptide 96.4 0.0 - 100.0 pg/mL    Comment: Performed at [facility], [place] [address], [place], [place] [address]  Protime-INR     Status: None   Collection Time: 08/26/21  5:58 AM  Result Value Ref Range   Prothrombin Time 11.9 11.4 - 15.2 seconds   INR 0.9 0.8 - 1.2    Comment: (NOTE) INR goal varies based on device and disease states. Performed at [facility], [place] [address], [place], [place] [address]   Type and screen [place]     Status: None   Collection Time: 08/26/21  5:58 AM  Result Value Ref Range   ABO/RH(D) A POS    Antibody Screen NEG    Sample Expiration      08/29/2021,2359 Performed at [facility], [place] [address] [place], [place] [address]   Troponin I (High Sensitivity)     Status: Abnormal   Collection Time: 08/26/21  5:58 AM  Result Value Ref Range   Troponin I (High Sensitivity) 311 (HH) <18 ng/L    Comment: REPEATED TO VERIFY CRITICAL RESULT CALLED TO, READ BACK BY AND VERIFIED WITH: C.Adarryl Goldammer,RN 0751 08/26/21 CLARK,S Performed at [facility] [address], [place], [place] [address]   I-Stat arterial blood gas, ED     Status: Abnormal   Collection Time: 08/26/21  8:44 AM  Result Value Ref Range   pH, Arterial 7.397 7.35 - 7.45   pCO2 arterial 50.1 (H) 32 - 48 mmHg   pO2, Arterial 95 83 - 108 mmHg   Bicarbonate 30.9 (H) 20.0 - 28.0 mmol/L   TCO2 32 22 - 32 mmol/L    O2 Saturation 97 %   Acid-Base Excess 5.0 (H) 0.0 - 2.0 mmol/L   Sodium 137 135 - 145 mmol/L   Potassium 4.0 3.5 - 5.1 mmol/L   Calcium, Ion 1.22 1.15 - 1.40 mmol/L   HCT 34.0 (L) 36.0 - 46.0 %   Hemoglobin 11.6 (L) 12.0 - 15.0 g/dL   Patient temperature 98.1 F    Collection site RADIAL, ALLEN'S TEST ACCEPTABLE    Drawn by RT    Sample type ARTERIAL   Procalcitonin - Baseline     Status: None   Collection Time: 08/26/21  9:04 AM  Result Value Ref Range   Procalcitonin <0.10 ng/mL    Comment:        Interpretation: PCT (Procalcitonin) <= 0.5 ng/mL: Systemic infection (sepsis) is not likely. Local bacterial infection is possible. (NOTE)       Sepsis PCT Algorithm           Lower Respiratory Tract                                      Infection PCT Algorithm    ----------------------------     ----------------------------         PCT < 0.25 ng/mL                PCT < 0.10 ng/mL          Strongly encourage             Strongly discourage   discontinuation of antibiotics    initiation of antibiotics    ----------------------------     -----------------------------       PCT 0.25 - 0.50 ng/mL  PCT 0.10 - 0.25 ng/mL               OR       >80% decrease in PCT            Discourage initiation of                                            antibiotics      Encourage discontinuation           of antibiotics    ----------------------------     -----------------------------         PCT >= 0.50 ng/mL              PCT 0.26 - 0.50 ng/mL               AND        <80% decrease in PCT             Encourage initiation of                                             antibiotics       Encourage continuation           of antibiotics    ----------------------------     -----------------------------        PCT >= 0.50 ng/mL                  PCT > 0.50 ng/mL               AND         increase in PCT                  Strongly encourage                                      initiation of  antibiotics    Strongly encourage escalation           of antibiotics                                     -----------------------------                                           PCT <= 0.25 ng/mL                                                 OR                                        > 80% decrease in PCT  Discontinue / Do not initiate                                             antibiotics  Performed at [facility], [place] [address], [place], [place] [address]   Troponin I (High Sensitivity)     Status: Abnormal   Collection Time: 08/26/21  9:04 AM  Result Value Ref Range   Troponin I (High Sensitivity) 423 (HH) <18 ng/L    Comment: CRITICAL VALUE NOTED.  VALUE IS CONSISTENT WITH PREVIOUSLY REPORTED AND CALLED VALUE. (NOTE) Elevated high sensitivity troponin I (hsTnI) values and significant  changes across serial measurements may suggest ACS but many other  chronic and acute conditions are known to elevate hsTnI results.  Refer to the Links section for chest pain algorithms and additional  guidance. Performed at [facility], [place] [address] [place], [place] [address]    DG Thoracic Spine 2 View  Result Date: 08/26/2021 CLINICAL DATA:  Pain. EXAM: THORACIC SPINE 2 VIEWS COMPARISON:  None Available. FINDINGS: Two-view exam is limited by osteopenia. Loss of vertebral body height noted at 2 levels in the midthoracic spine likely T6 and T7, proximally 25% loss of height anteriorly at both levels. Features are age indeterminate by x-ray. No abnormal paraspinal line on the frontal projection. Possible sclerosis involving the right pedicle of the T7 vertebral body on the frontal projection. IMPRESSION: Loss of vertebral body height at 2 levels in the midthoracic spine, likely T6 and T7, age indeterminate by x-ray. Possible sclerosis of the right T7 pedicle. MRI thoracic spine could be used to further evaluate as clinically warranted.  Electronically Signed   By: Misty Stanley M.D.   On: 08/26/2021 05:59   DG Lumbar Spine Complete  Result Date: 08/26/2021 CLINICAL DATA:  Pain. EXAM: LUMBAR SPINE - COMPLETE 4+ VIEW COMPARISON:  None Available. FINDINGS: Bones are demineralized. No evidence for lumbar spine fracture or subluxation. Mild loss of disc height noted at L3-4, L4-5 common L5-S1. Mild bilateral facet degeneration noted in the lower lumbar spine. Atherosclerotic calcification characterizes the abdominal aorta. IMPRESSION: Degenerative changes in the lower lumbar spine without acute bony abnormality. Aortic Atherosclerois (ICD10-170.0) Electronically Signed   By: Misty Stanley M.D.   On: 08/26/2021 06:01   CT Head Wo Contrast  Addendum Date: 08/26/2021   ADDENDUM REPORT: 08/26/2021 05:21 ADDENDUM: Study discussed by telephone with Dr. Ripley Fraise on 08/26/2021 at 0518 hours. Electronically Signed   By: Genevie Ann M.D.   On: 08/26/2021 05:21   Result Date: 08/26/2021 CLINICAL DATA:  66 year old female with dizziness, hypertensive (systolic 025K). Fall 3 days ago. Diaphoretic. EXAM: CT HEAD WITHOUT CONTRAST TECHNIQUE: Contiguous axial images were obtained from the base of the skull through the vertex without intravenous contrast. RADIATION DOSE REDUCTION: This exam was performed according to the departmental dose-optimization program which includes automated exposure control, adjustment of the mA and/or kV according to patient size and/or use of iterative reconstruction technique. COMPARISON:  Brain MRI 05/12/2021.  Paranasal sinus CT 10/28/2016. FINDINGS: Brain: Small volume hyperdense blood along the interhemispheric fissure, left greater than right (series 3, images 24 and 27), most likely in the subarachnoid space. No dural thickening or nodularity of the falx on February MRI. No IVH or ventriculomegaly. Basilar cisterns appear to remain normal. Fetal type PCA origins again noted. No blood along the tentorium or  elsewhere. No  superimposed midline shift, mass effect, evidence of mass lesion, or evidence of cortically based acute infarction. Gray-white matter differentiation is within normal limits throughout the brain. Vascular: Calcified atherosclerosis at the skull base. No suspicious intracranial vascular hyperdensity. Skull: No fracture identified. Sinuses/Orbits: Visualized paranasal sinuses and mastoids are clear. Other: No acute orbit or scalp soft tissue injury identified. IMPRESSION: 1. Positive for trace Subarachnoid Hemorrhage along the interhemispheric fissure. Favor sequelae of trauma given pattern of blood and recent fall. 2. No skull fracture or other acute traumatic injury to the brain identified. 3. Otherwise negative for age non contrast CT appearance of the brain. Electronically Signed: By: Genevie Ann M.D. On: 08/26/2021 05:15   CT Cervical Spine Wo Contrast  Result Date: 08/26/2021 CLINICAL DATA:  66 year old female with dizziness, hypertensive (systolic 379K). Fall 3 days ago. Diaphoretic. EXAM: CT CERVICAL SPINE WITHOUT CONTRAST TECHNIQUE: Multidetector CT imaging of the cervical spine was performed without intravenous contrast. Multiplanar CT image reconstructions were also generated. RADIATION DOSE REDUCTION: This exam was performed according to the departmental dose-optimization program which includes automated exposure control, adjustment of the mA and/or kV according to patient size and/or use of iterative reconstruction technique. COMPARISON:  Head CT today reported separately. FINDINGS: Alignment: Straightening and mild reversal of cervical lordosis. Cervicothoracic junction alignment is within normal limits. Bilateral posterior element alignment is within normal limits. Skull base and vertebrae: Visualized skull base is intact. No atlanto-occipital dissociation. C1 and C2 appear intact and aligned. Intermittent mild motion artifact. No acute osseous abnormality identified. Soft tissues and spinal canal: No  prevertebral fluid or swelling. No visible canal hematoma. Partially retropharyngeal course of both carotids in the neck (normal variant). Otherwise negative visible noncontrast neck soft tissues. Disc levels: Chronic cervical spine disc and endplate degeneration. No convincing cervical spinal stenosis. Upper chest: Osteopenia. Grossly intact visible upper thoracic levels. Emphysema. Retained secretions in the trachea at the thoracic inlet. IMPRESSION: 1. No acute traumatic injury identified in the cervical spine. 2. Emphysema (ICD10-J43.9). Retained secretions in the trachea at the thoracic inlet, Aspiration not excluded. Electronically Signed   By: Genevie Ann M.D.   On: 08/26/2021 05:19   DG Chest Port 1 View  Result Date: 08/26/2021 CLINICAL DATA:  Shortness of breath and chest pain. EXAM: PORTABLE CHEST 1 VIEW COMPARISON:  Portable chest 06/29/2021. FINDINGS: Heart size and vascular pattern are normal. There is aortic atherosclerosis in the transverse segment, unremarkable mediastinal configuration. No vascular congestion is seen. The lungs emphysematous. There is an irregular 2 x 1.1 cm opacity developed in the right upper lobe mid perihilar area. The remaining lungs clear. No pleural collection is seen. There is thoracic spondylosis. IMPRESSION: 2 x 1.1 cm irregular opacity developed, right upper lobe mid perihilar area. The last chest CT was 05/08/2021 and demonstrated perifissural scar-like thickening in the right upper lung field extending along the major fissure. I suspect this is probably being seen in summation given the apical lordotic positioning on this study. A small infiltrate or less likely mass are both possible in a high-risk patient. Chest CT could rule out mass if needed or a short interval follow-up study could be considered to see if this persists. Electronically Signed   By: Telford Nab M.D.   On: 08/26/2021 06:10    Pending Labs Unresulted Labs (From admission, onward)     Start      Ordered   08/27/21 0500  CBC with Differential/Platelet  Tomorrow morning,   R  08/26/21 0802   08/27/21 1572  Basic metabolic panel  Tomorrow morning,   R        08/26/21 0802   08/26/21 1121  TSH  Add-on,   AD        08/26/21 1120   08/26/21 0806  Urinalysis, Routine w reflex microscopic  Once,   R        08/26/21 0805   08/26/21 0700  ABO/Rh  Once,   R        08/26/21 0700   08/26/21 0648  Blood gas, arterial  Once,   R        08/26/21 0647            Vitals/Pain Today's Vitals   08/26/21 1030 08/26/21 1045 08/26/21 1100 08/26/21 1400  BP: 90/60 (!) 100/57 (!) 100/58 103/66  Pulse: 88 88 88 91  Resp: 20 (!) '26 19 20  '$ Temp:      TempSrc:      SpO2: 97% 97% 98% 98%  Weight:      Height:      PainSc:        Isolation Precautions No active isolations  Medications Medications  sodium chloride flush (NS) 0.9 % injection 3 mL (3 mLs Intravenous Given 08/26/21 0913)  ipratropium-albuterol (DUONEB) 0.5-2.5 (3) MG/3ML nebulizer solution 3 mL (3 mLs Nebulization Given 08/26/21 0836)  arformoterol (BROVANA) nebulizer solution 15 mcg (15 mcg Nebulization Given 08/26/21 0906)  budesonide (PULMICORT) nebulizer solution 0.5 mg (0.5 mg Nebulization Given 08/26/21 0906)  azithromycin (ZITHROMAX) 500 mg in sodium chloride 0.9 % 250 mL IVPB (0 mg Intravenous Stopped 08/26/21 1120)    Followed by  azithromycin (ZITHROMAX) tablet 500 mg (has no administration in time range)  acetaminophen (TYLENOL) tablet 650 mg (has no administration in time range)    Or  acetaminophen (TYLENOL) suppository 650 mg (has no administration in time range)  albuterol (PROVENTIL) (2.5 MG/3ML) 0.083% nebulizer solution 2.5 mg (has no administration in time range)  hydrALAZINE (APRESOLINE) injection 10 mg (has no administration in time range)  predniSONE (DELTASONE) tablet 60 mg (has no administration in time range)  butalbital-acetaminophen-caffeine (FIORICET) 50-325-40 MG per tablet 1 tablet (has no  administration in time range)  lidocaine (LIDODERM) 5 % 1 patch (has no administration in time range)  HYDROcodone-acetaminophen (NORCO) 7.5-325 MG per tablet 1 tablet (has no administration in time range)  metoprolol succinate (TOPROL-XL) 24 hr tablet 12.5 mg (has no administration in time range)  ALPRAZolam (XANAX) tablet 0.5 mg (has no administration in time range)  PARoxetine (PAXIL) tablet 30 mg (has no administration in time range)  magnesium oxide (MAG-OX) tablet 400 mg (has no administration in time range)  loratadine (CLARITIN) tablet 10 mg (has no administration in time range)  guaiFENesin (MUCINEX) 12 hr tablet 1,200 mg (has no administration in time range)  nystatin (MYCOSTATIN) 100000 UNIT/ML suspension 500,000 Units (has no administration in time range)  olopatadine (PATANOL) 0.1 % ophthalmic solution 1 drop (has no administration in time range)  losartan (COZAAR) tablet 25 mg (has no administration in time range)  LORazepam (ATIVAN) injection 0.5 mg (has no administration in time range)  perflutren lipid microspheres (DEFINITY) IV suspension (2 mLs Intravenous Given 08/26/21 1447)  albuterol (PROVENTIL) (2.5 MG/3ML) 0.083% nebulizer solution (10 mg/hr Nebulization Given 08/26/21 0527)  magnesium sulfate IVPB 2 g 50 mL (0 g Intravenous Stopped 08/26/21 0700)  0.9 %  sodium chloride infusion ( Intravenous New Bag/Given 08/26/21 0905)  promethazine (PHENERGAN) 12.5 mg in  sodium chloride 0.9 % 50 mL IVPB (0 mg Intravenous Stopped 08/26/21 1410)  diphenhydrAMINE (BENADRYL) injection 12.5 mg (12.5 mg Intravenous Given 08/26/21 1229)    Mobility walks with device Moderate fall risk   Focused Assessments Neuro Assessment Handoff:  Swallow screen pass? Yes          Neuro Assessment:   Neuro Checks:      Last Documented NIHSS Modified Score:   Has TPA been given? No If patient is a Neuro Trauma and patient is going to OR before floor call report to [facility] nurse: [phone]  or [phone]   R Recommendations: See Admitting Provider Note  Report given to:   Additional Notes:

## 2021-08-26 NOTE — ED Provider Notes (Signed)
[facility] EMERGENCY DEPARTMENT Provider Note   CSN: [id number] Arrival date & time: 08/26/21  1275     History  Chief Complaint  Patient presents with   Shortness of Breath   Level 5 caveat due to acuity of condition Kim [name] is a 66 y.o. female.  The history is provided by the patient.  Shortness of Breath Severity:  Severe Onset quality:  Sudden Timing:  Constant Progression:  Worsening Chronicity:  New Relieved by:  Nothing Worsened by:  Nothing Associated symptoms: diaphoresis, headaches and neck pain   Associated symptoms: no abdominal pain   Patient with history of chronic respiratory failure on 2 L at home, COPD, previous history of Takotsubo cardiomyopathy presents with shortness of breath.  She reports tonight she woke up to go the bathroom and felt lightheaded and checked her blood pressure and it was elevated.  She reports feeling short of breath and gave herself albuterol with no improvement.  EMS was called and they arrived and reported she was diaphoretic and in respiratory distress.  She was given a DuoNeb and Solu-Medrol, placed on 6 L of oxygen.  Patient reports recent history of syncopal episode about 2 days ago.  She reports she passed out and woke up on the floor.  Since that time she had headache, neck pain and back pain.  She was waiting until Monday morning to see her orthopedist for this issue. She is not on anticoagulation  Home Medications Prior to Admission medications   Medication Sig Start Date End Date Taking? Authorizing Provider  albuterol (VENTOLIN HFA) 108 (90 Base) MCG/ACT inhaler INHALE TWO PUFFS INTO LUNGS EVERY 4 TO 6 HOURS AS NEEDED FOR SHORTNESS OF BREATH/WHEEZING. 07/26/21   Martyn Ehrich, NP  ALPRAZolam Duanne Moron) 0.5 MG tablet Take 1 tablet (0.5 mg total) by mouth 3 (three) times daily as needed for anxiety. 07/10/21   Nafziger, Tommi Rumps, NP  aspirin EC 81 MG EC tablet Take 1 tablet (81 mg total) by mouth daily.  07/08/15   Nita Sells, MD  cetirizine (ZYRTEC) 10 MG tablet Take 10 mg by mouth daily.    [provider]  furosemide (LASIX) 40 MG tablet Take 1 tablet (40 mg total) by mouth daily as needed (for an overnight weight gain of 3-5 pounds of fluid). 08/16/21   [name], MD  HYDROcodone-acetaminophen (NORCO) 7.5-325 MG tablet Take 1 tablet by mouth every 6 (six) hours as needed for moderate pain. 06/04/21   Nafziger, Tommi Rumps, NP  ipratropium-albuterol (DUONEB) 0.5-2.5 (3) MG/3ML SOLN USE 3 ML VIA NEBULIZER EVERY 6 HOURS AS NEEDED.  DX: J44.9 07/09/21   Margaretha Seeds, MD  losartan (COZAAR) 25 MG tablet Take 1 tablet (25 mg total) by mouth daily. 04/26/21   Richardson Dopp T, PA-C  magnesium oxide (MAG-OX) 400 MG tablet Take 400 mg by mouth daily.    [provider]  metoprolol succinate (TOPROL-XL) 25 MG 24 hr tablet Take 0.5 tablets (12.5 mg total) by mouth daily. 01/04/21   [name], MD  MUCINEX MAXIMUM STRENGTH 1200 MG TB12 Take 1,200 mg by mouth every 12 (twelve) hours.    [provider]  nitroGLYCERIN (NITROSTAT) 0.4 MG SL tablet Place 1 tablet (0.4 mg total) under the tongue every 5 (five) minutes as needed for chest pain. 10/24/20   [name], MD  OXYGEN Inhale 2 L/min into the lungs continuous.    [provider]  PARoxetine (PAXIL) 30 MG tablet Take 1  tablet (30 mg total) by mouth daily. 06/11/21   Nafziger, Tommi Rumps, NP  Potassium 99 MG TABS Take 1 tablet by mouth as needed.    [provider]  predniSONE (DELTASONE) 10 MG tablet Take 1 tablet (10 mg total) by mouth daily. Take 4 pills daily for 3 days then 3 pills daily for 3 days then 2 pills daily for 3 days then continue taking 1 pill a day until you follow-up with your pulmonologist Patient not taking: Reported on 08/08/2021 06/30/21   Shelly Coss, MD  predniSONE (DELTASONE) 5 MG tablet Take 1 tablet (5 mg total) by mouth daily with breakfast. 07/09/21    Margaretha Seeds, MD  rosuvastatin (CRESTOR) 20 MG tablet Take 20 mg by mouth at bedtime. Patient not taking: Reported on 08/20/2021 03/27/21   [provider]  Spacer/Aero Chamber Mouthpiece MISC 1 Device by Does not apply route as directed. 01/12/17   Javier Glazier, MD  SYMBICORT 160-4.5 MCG/ACT inhaler INHALE 2 PUFFS TWICE DAILY Patient taking differently: 2 puffs 2 (two) times daily. 12/31/20   Martyn Ehrich, NP  Tiotropium Bromide Monohydrate (SPIRIVA RESPIMAT) 2.5 MCG/ACT AERS Inhale 2 puffs into the lungs daily. 12/06/20   Martyn Ehrich, NP      Allergies    Tape, Daliresp [roflumilast], Lisinopril, Morphine and related, Penicillins, and Omnicef [cefdinir]    Review of Systems   Review of Systems  Constitutional:  Positive for diaphoresis.  Respiratory:  Positive for shortness of breath.   Gastrointestinal:  Negative for abdominal pain.  Musculoskeletal:  Positive for back pain and neck pain.  Neurological:  Positive for headaches.   Physical Exam Updated Vital Signs BP 140/79   Pulse 100   Temp 98.1 F (36.7 C) (Oral)   Resp (!) 22   Ht 1.549 m ('5\' 1"'$ )   Wt 65.3 kg   SpO2 100%   BMI 27.21 kg/m  Physical Exam CONSTITUTIONAL: Elderly, ill-appearing, diaphoretic HEAD: Normocephalic/atraumatic EYES: EOMI/PERRL ENMT: Mucous membranes moist NECK: supple no meningeal signs SPINE/BACK: Diffuse cervical/thoracic/thoracic tenderness, no bruising/crepitance/stepoffs noted to spine CV: S1/S2 noted, tachycardic LUNGS: Diffuse wheezing bilaterally, tachypnea ABDOMEN: soft, nontender, no rebound or guarding, bowel sounds noted throughout abdomen GU:no cva tenderness NEURO: Pt is awake/alert/appropriate, moves all extremitiesx4.  No facial droop.  GCS 15 EXTREMITIES: pulses normal/equal, full ROM, all other extremities/joints palpated/ranged and nontender SKIN: warm, color normal PSYCH: Anxious ED Results / Procedures / Treatments   Labs (all labs ordered  are listed, but only abnormal results are displayed) Labs Reviewed  CBC - Abnormal; Notable for the following components:      Result Value   WBC 20.2 (*)    All other components within normal limits  BASIC METABOLIC PANEL - Abnormal; Notable for the following components:   CO2 33 (*)    Glucose, Bld 123 (*)    BUN 25 (*)    Creatinine, Ser 1.30 (*)    GFR, Estimated 45 (*)    All other components within normal limits  TROPONIN I (HIGH SENSITIVITY) - Abnormal; Notable for the following components:   Troponin I (High Sensitivity) 76 (*)    All other components within normal limits  BRAIN NATRIURETIC PEPTIDE  PROTIME-INR  TYPE AND SCREEN  TROPONIN I (HIGH SENSITIVITY)    EKG EKG Interpretation  Date/Time:  Monday Aug 26 2021 04:19:41 EDT Ventricular Rate:  97 PR Interval:  177 QRS Duration: 116 QT Interval:  353 QTC Calculation: 449 R Axis:   -66 Text  Interpretation: Sinus rhythm Probable left atrial enlargement Nonspecific IVCD with LAD Abnormal lateral Q waves Anteroseptal infarct, age indeterminate Abnormal ekg Confirmed by Ripley Fraise [phone]) on 08/26/2021 4:34:00 AM  Radiology DG Thoracic Spine 2 View  Result Date: 08/26/2021 CLINICAL DATA:  Pain. EXAM: THORACIC SPINE 2 VIEWS COMPARISON:  None Available. FINDINGS: Two-view exam is limited by osteopenia. Loss of vertebral body height noted at 2 levels in the midthoracic spine likely T6 and T7, proximally 25% loss of height anteriorly at both levels. Features are age indeterminate by x-ray. No abnormal paraspinal line on the frontal projection. Possible sclerosis involving the right pedicle of the T7 vertebral body on the frontal projection. IMPRESSION: Loss of vertebral body height at 2 levels in the midthoracic spine, likely T6 and T7, age indeterminate by x-ray. Possible sclerosis of the right T7 pedicle. MRI thoracic spine could be used to further evaluate as clinically warranted. Electronically Signed   By: Misty Stanley M.D.   On: 08/26/2021 05:59   DG Lumbar Spine Complete  Result Date: 08/26/2021 CLINICAL DATA:  Pain. EXAM: LUMBAR SPINE - COMPLETE 4+ VIEW COMPARISON:  None Available. FINDINGS: Bones are demineralized. No evidence for lumbar spine fracture or subluxation. Mild loss of disc height noted at L3-4, L4-5 common L5-S1. Mild bilateral facet degeneration noted in the lower lumbar spine. Atherosclerotic calcification characterizes the abdominal aorta. IMPRESSION: Degenerative changes in the lower lumbar spine without acute bony abnormality. Aortic Atherosclerois (ICD10-170.0) Electronically Signed   By: Misty Stanley M.D.   On: 08/26/2021 06:01   CT Head Wo Contrast  Addendum Date: 08/26/2021   ADDENDUM REPORT: 08/26/2021 05:21 ADDENDUM: Study discussed by telephone with Dr. Ripley Fraise on 08/26/2021 at 0518 hours. Electronically Signed   By: Genevie Ann M.D.   On: 08/26/2021 05:21   Result Date: 08/26/2021 CLINICAL DATA:  66 year old female with dizziness, hypertensive (systolic 213Y). Fall 3 days ago. Diaphoretic. EXAM: CT HEAD WITHOUT CONTRAST TECHNIQUE: Contiguous axial images were obtained from the base of the skull through the vertex without intravenous contrast. RADIATION DOSE REDUCTION: This exam was performed according to the departmental dose-optimization program which includes automated exposure control, adjustment of the mA and/or kV according to patient size and/or use of iterative reconstruction technique. COMPARISON:  Brain MRI 05/12/2021.  Paranasal sinus CT 10/28/2016. FINDINGS: Brain: Small volume hyperdense blood along the interhemispheric fissure, left greater than right (series 3, images 24 and 27), most likely in the subarachnoid space. No dural thickening or nodularity of the falx on February MRI. No IVH or ventriculomegaly. Basilar cisterns appear to remain normal. Fetal type PCA origins again noted. No blood along the tentorium or elsewhere. No superimposed midline shift, mass  effect, evidence of mass lesion, or evidence of cortically based acute infarction. Gray-white matter differentiation is within normal limits throughout the brain. Vascular: Calcified atherosclerosis at the skull base. No suspicious intracranial vascular hyperdensity. Skull: No fracture identified. Sinuses/Orbits: Visualized paranasal sinuses and mastoids are clear. Other: No acute orbit or scalp soft tissue injury identified. IMPRESSION: 1. Positive for trace Subarachnoid Hemorrhage along the interhemispheric fissure. Favor sequelae of trauma given pattern of blood and recent fall. 2. No skull fracture or other acute traumatic injury to the brain identified. 3. Otherwise negative for age non contrast CT appearance of the brain. Electronically Signed: By: Genevie Ann M.D. On: 08/26/2021 05:15   CT Cervical Spine Wo Contrast  Result Date: 08/26/2021 CLINICAL DATA:  66 year old female with dizziness, hypertensive (systolic 865H). Fall 3 days ago. Diaphoretic.  EXAM: CT CERVICAL SPINE WITHOUT CONTRAST TECHNIQUE: Multidetector CT imaging of the cervical spine was performed without intravenous contrast. Multiplanar CT image reconstructions were also generated. RADIATION DOSE REDUCTION: This exam was performed according to the departmental dose-optimization program which includes automated exposure control, adjustment of the mA and/or kV according to patient size and/or use of iterative reconstruction technique. COMPARISON:  Head CT today reported separately. FINDINGS: Alignment: Straightening and mild reversal of cervical lordosis. Cervicothoracic junction alignment is within normal limits. Bilateral posterior element alignment is within normal limits. Skull base and vertebrae: Visualized skull base is intact. No atlanto-occipital dissociation. C1 and C2 appear intact and aligned. Intermittent mild motion artifact. No acute osseous abnormality identified. Soft tissues and spinal canal: No prevertebral fluid or swelling. No  visible canal hematoma. Partially retropharyngeal course of both carotids in the neck (normal variant). Otherwise negative visible noncontrast neck soft tissues. Disc levels: Chronic cervical spine disc and endplate degeneration. No convincing cervical spinal stenosis. Upper chest: Osteopenia. Grossly intact visible upper thoracic levels. Emphysema. Retained secretions in the trachea at the thoracic inlet. IMPRESSION: 1. No acute traumatic injury identified in the cervical spine. 2. Emphysema (ICD10-J43.9). Retained secretions in the trachea at the thoracic inlet, Aspiration not excluded. Electronically Signed   By: Genevie Ann M.D.   On: 08/26/2021 05:19   DG Chest Port 1 View  Result Date: 08/26/2021 CLINICAL DATA:  Shortness of breath and chest pain. EXAM: PORTABLE CHEST 1 VIEW COMPARISON:  Portable chest 06/29/2021. FINDINGS: Heart size and vascular pattern are normal. There is aortic atherosclerosis in the transverse segment, unremarkable mediastinal configuration. No vascular congestion is seen. The lungs emphysematous. There is an irregular 2 x 1.1 cm opacity developed in the right upper lobe mid perihilar area. The remaining lungs clear. No pleural collection is seen. There is thoracic spondylosis. IMPRESSION: 2 x 1.1 cm irregular opacity developed, right upper lobe mid perihilar area. The last chest CT was 05/08/2021 and demonstrated perifissural scar-like thickening in the right upper lung field extending along the major fissure. I suspect this is probably being seen in summation given the apical lordotic positioning on this study. A small infiltrate or less likely mass are both possible in a high-risk patient. Chest CT could rule out mass if needed or a short interval follow-up study could be considered to see if this persists. Electronically Signed   By: Telford Nab M.D.   On: 08/26/2021 06:10    Procedures .Critical Care Performed by: Ripley Fraise, MD Authorized by: Ripley Fraise, MD    Critical care provider statement:    Critical care time (minutes):  66   Critical care start time:  08/26/2021 5:14 AM   Critical care end time:  08/26/2021 6:20 AM   Critical care time was exclusive of:  Separately billable procedures and treating other patients   Critical care was necessary to treat or prevent imminent or life-threatening deterioration of the following conditions:  Respiratory failure and trauma   Critical care was time spent personally by me on the following activities:  Development of treatment plan with patient or surrogate, re-evaluation of patient's condition, review of old charts, pulse oximetry, ordering and review of radiographic studies, ordering and review of laboratory studies, ordering and performing treatments and interventions, evaluation of patient's response to treatment, examination of patient and obtaining history from patient or surrogate   I assumed direction of critical care for this patient from another provider in my specialty: no     Care discussed with: admitting  provider      Medications Ordered in ED Medications  magnesium sulfate IVPB 2 g 50 mL (has no administration in time range)  albuterol (PROVENTIL) (2.5 MG/3ML) 0.083% nebulizer solution (10 mg/hr Nebulization Given 08/26/21 0527)    ED Course/ Medical Decision Making/ A&P Clinical Course as of 08/26/21 0622  Mon Aug 26, 2021  0446 leukocytosis [DW]  0516 Creatinine(!): 1.30 Renal insufficiency [DW]  3662 Patient presents with 2 issues.  She currently appears in respiratory distress due to COPD.  Continuous albuterol has been ordered, she may also need noninvasive ventilation.  Patient also had recent syncopal episode, is now having headache and spinal pain.  Cervical collar was placed and she has  had imaging ordered [DW]  0540 Discussed with radiology.  Patient has a small area of subarachnoid hemorrhage on CT head.  It is likely traumatic in nature.  Patient denies any recent headache  prior to her syncopal episode.  She only had a headache after her episode.  CT C-spine is negative and c-collar was removed. [DW]  0540 Overall work of breathing is improving.  We will place her on albuterol treatment, and defer BiPAP for now [DW]  0619 Patient overall appears improved.  Aside from headache, she has no other new complaints.  Work of breathing improved.  No signs of any abdominal trauma.  No signs of any extremity trauma. [DW]  0620 Patient does report recent visual changes over the past several weeks.  Unclear cause of these findings.  Patient may benefit from MRI brain and neurology consultation while inpatient. [DW]  9476 Discussed with Dr. Cyd Silence for admission.  Patient may need repeat CT head or MRI brain.  Patient may also need further chest imaging due to findings on chest x-ray.  We will also place call to neurosurgery [DW]    Clinical Course User Index [DW] Ripley Fraise, MD                           Medical Decision Making Amount and/or Complexity of Data Reviewed Labs: ordered. Decision-making details documented in ED Course. Radiology: ordered.  Risk Prescription drug management. Decision regarding hospitalization.   This patient presents to the ED for concern of shortness of breath, this involves an extensive number of treatment options, and is a complaint that carries with it a high risk of complications and morbidity.  The differential diagnosis includes but is not limited to pneumonia, heart failure, pulmonary embolism, acute coronary syndrome, pneumothorax, COPD exacerbation, asthma exacerbation    Comorbidities that complicate the patient evaluation: Patient's presentation is complicated by their history of chronic respiratory failure, cardiomyopathy  Social Determinants of Health: Patient's  physical inactivity   increases the complexity of managing their presentation  Additional history obtained: Additional history obtained from EMS discussed  with EMS at bedside Records reviewed previous admission documents  Lab Tests: I Ordered, and personally interpreted labs.  The pertinent results include: Leukocytosis  Imaging Studies ordered: I ordered imaging studies including CT scan head and C-spine and X-ray chest/thoracic/lumbar spine   I independently visualized and interpreted imaging which showed CT head was reviewed personally and reveals subarachnoid hemorrhage Possible compression fracture on thoracic spine. ?  Lung nodule on chest x-ray I agree with the radiologist interpretation  Cardiac Monitoring: The patient was maintained on a cardiac monitor.  I personally viewed and interpreted the cardiac monitor which showed an underlying rhythm of:  sinus tachycardia  Medicines ordered and prescription  drug management: I ordered medication including continuous albuterol and magnesium for COPD exacerbation Reevaluation of the patient after these medicines showed that the patient    improved  Critical Interventions:  Nebulized treatment  Consultations Obtained: I requested consultation with the admitting physician Dr. Cyd Silence , and discussed  findings as well as pertinent plan - they recommend: Admission  Reevaluation: After the interventions noted above, I reevaluated the patient and found that they have :improved  Complexity of problems addressed: Patient's presentation is most consistent with  acute presentation with potential threat to life or bodily function  Disposition: After consideration of the diagnostic results and the patient's response to treatment,  I feel that the patent would benefit from admission   .           Final Clinical Impression(s) / ED Diagnoses Final diagnoses:  COPD exacerbation ([place])  Acute respiratory failure with hypoxia ([place])  SAH (subarachnoid hemorrhage) ([place])  Lung nodule    Rx / DC Orders ED Discharge Orders     None         Ripley Fraise, MD 08/26/21 [phone]

## 2021-08-27 ENCOUNTER — Encounter: Payer: Medicare Other | Admitting: Adult Health

## 2021-08-27 DIAGNOSIS — I609 Nontraumatic subarachnoid hemorrhage, unspecified: Secondary | ICD-10-CM | POA: Diagnosis not present

## 2021-08-27 DIAGNOSIS — J9621 Acute and chronic respiratory failure with hypoxia: Secondary | ICD-10-CM | POA: Diagnosis not present

## 2021-08-27 DIAGNOSIS — J441 Chronic obstructive pulmonary disease with (acute) exacerbation: Secondary | ICD-10-CM | POA: Diagnosis not present

## 2021-08-27 DIAGNOSIS — R55 Syncope and collapse: Secondary | ICD-10-CM | POA: Diagnosis not present

## 2021-08-27 LAB — CBC WITH DIFFERENTIAL/PLATELET
Abs Immature Granulocytes: 0.18 10*3/uL — ABNORMAL HIGH (ref 0.00–0.07)
Basophils Absolute: 0.1 10*3/uL (ref 0.0–0.1)
Basophils Relative: 0 %
Eosinophils Absolute: 0 10*3/uL (ref 0.0–0.5)
Eosinophils Relative: 0 %
HCT: 31 % — ABNORMAL LOW (ref 36.0–46.0)
Hemoglobin: 10.2 g/dL — ABNORMAL LOW (ref 12.0–15.0)
Immature Granulocytes: 1 %
Lymphocytes Relative: 10 %
Lymphs Abs: 1.8 10*3/uL (ref 0.7–4.0)
MCH: 31.2 pg (ref 26.0–34.0)
MCHC: 32.9 g/dL (ref 30.0–36.0)
MCV: 94.8 fL (ref 80.0–100.0)
Monocytes Absolute: 1.5 10*3/uL — ABNORMAL HIGH (ref 0.1–1.0)
Monocytes Relative: 9 %
Neutro Abs: 14.6 10*3/uL — ABNORMAL HIGH (ref 1.7–7.7)
Neutrophils Relative %: 80 %
Platelets: 274 10*3/uL (ref 150–400)
RBC: 3.27 MIL/uL — ABNORMAL LOW (ref 3.87–5.11)
RDW: 14.4 % (ref 11.5–15.5)
WBC: 18.1 10*3/uL — ABNORMAL HIGH (ref 4.0–10.5)
nRBC: 0 % (ref 0.0–0.2)

## 2021-08-27 LAB — BASIC METABOLIC PANEL
Anion gap: 6 (ref 5–15)
BUN: 29 mg/dL — ABNORMAL HIGH (ref 8–23)
CO2: 30 mmol/L (ref 22–32)
Calcium: 8.9 mg/dL (ref 8.9–10.3)
Chloride: 103 mmol/L (ref 98–111)
Creatinine, Ser: 1.07 mg/dL — ABNORMAL HIGH (ref 0.44–1.00)
GFR, Estimated: 57 mL/min — ABNORMAL LOW (ref >60)
Glucose, Bld: 127 mg/dL — ABNORMAL HIGH (ref 70–99)
Potassium: 4.2 mmol/L (ref 3.5–5.1)
Sodium: 139 mmol/L (ref 135–145)

## 2021-08-27 MED ORDER — METHYLPREDNISOLONE SODIUM SUCC 125 MG IJ SOLR
120.0000 mg | Freq: Every day | INTRAMUSCULAR | Status: DC
  Administered 2021-08-27 – 2021-08-28 (×2): 120 mg via INTRAVENOUS
  Filled 2021-08-27 (×2): qty 2

## 2021-08-27 NOTE — Progress Notes (Signed)
PROGRESS NOTE    Kim [name]  TKW:[id number] DOB: 09/17/1955 DOA: 08/26/2021 PCP: [name], NP   Chief Complaint  Patient presents with   Shortness of Breath    Brief Narrative:   Kim [name] is a 66 y.o. female with medical history significant of hypertension, dyslipidemia, COPD, chronic respiratory failure on 2 L, alpha-1 antitrypsin carrier, Takotsubo cardiomyopathy , osteoporosis, depression, and anxiety who presents with complaints of shortness of breath overnight and cough, her work-up was significant for COPD exacerbation, diffuse wheezing, increased oxygen requirement, and elevated troponins, as well she does report history of syncope, fall with head trauma, and CT head came back with evidence of subdural hematoma.  So she was admitted for further work-up.    Assessment & Plan:   Active Problems:   Acute on chronic respiratory failure with hypoxia (HCC)   Acute on chronic respiratory failure with hypoxia and hypercapnia (HCC)   COPD with acute exacerbation (HCC)   Orthostatic hypotension   Subarachnoid hemorrhage (HCC)   Syncope and collapse   SIRS (systemic inflammatory response syndrome) (HCC)   Elevated troponin   Postconcussive syndrome   Double vision   AKI (acute kidney injury) ([place])   Anxiety and depression   Osteoporosis   Heart failure with preserved ejection fraction (HCC)   Acute on chronic respiratory failure with hypoxia and hypercapnia secondary to COPD exacerbation - Patient presents with complaints of acutely worsening shortness of breath with cough and wheezing.  -   Initially requiring up to 10 L of nasal cannula oxygen to maintain O2 saturations.  Has improved this morning, she is down to 6 L. - ABG noted pH is 7.397, PCO2 50.1, and PO2 95.   - Chest x-ray noted concern for possible opacity of the right upper midlung that may be associated with scar seen on previous CT imaging from February of this year.  -He remains with significant  wheezing continue with IV Solu-Medrol 120 mg IV daily. -DuoNebs 4 times daily and albuterol as needed -Brovana and budesonide nebs substitution for Symbicort -Empiric antibiotics of azithromycin -Continue cetirizine and Mucinex   Subarachnoid hemorrhage -Neurosurgery input greatly appreciated, no further work-up at this point.  secondary to syncope and collapse related to orthostatic hypotension -Hold aspirin.  Resume when medically appropriate. -PT to eval and treat  Fall and collapse -Patient reports dizziness, lightheadedness upon standing, she was initially with soft blood pressure which has improved with fluid, she was not orthostatic when seen by PT today but this was after she received IV fluids. -Continue with TED hose. -Consider midodrine if blood pressure drops      Elevated troponin/Takotsubo cardiomyopathy - -Prior history of Takotsubo cardiomyopathy with normalization of LV function.  No evidence of CAD by cath 11/2020. 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 -Cardiology, will need repeat echo , timing per cardiology.     Postconcussive syndrome After syncope and collapse patient had hit her head with unknown loss of consciousness.  Reports having persistent headache with decreased recent memory currently.  Findings are suggestive of a postconcussive syndrome. -Continue symptomatic treatment   Double vision -this is prior to admission, she is following with ophthalmology as an outpatient  Essential hypertension Blood pressures initially elevated up to 140/79 while in the ED.  Home blood pressure regimen includes losartan 25 mg daily, metoprolol 12.5 mg daily, and furosemide 40 mg daily as needed for weight gain  of 3 to 5 pounds. -Continue metoprolol and losartan as tolerated -Held furosemide   Back and hip pain lumbar compression fractures osteoporosis X-rays noted  chronic compression fractures of the thoracic spine with height loss.  X-rays of the pelvis did not note any acute abnormality. -Continue hydrocodone as needed for pain   Anxiety and depression Home medication regimen includes Paxil 30 mg daily and Xanax 0.5 mg 3 times daily as needed. -Continue current home regimen     DVT prophylaxis: SCD's only due to SDH Code Status: Full Family Communication: Husband at bedside Disposition:   Status is: Inpatient Remains inpatient appropriate because: Rico Junker, remains dyspneic   Consultants:  Cardiology Neurosurgery  Subjective:  Kim [name] reports generalized weakness, fatigue, initially lightheaded/dizzy with activity, but this has improved, she is complaining of headache  Objective: Vitals:   08/27/21 0911 08/27/21 1100 08/27/21 1143 08/27/21 1212  BP: 111/62   122/77  Pulse: 80 78  79  Resp:  (!) 23  17  Temp:    98.8 F (37.1 C)  TempSrc:    Oral  SpO2:  98% 100% 98%  Weight:      Height:        Intake/Output Summary (Last 24 hours) at 08/27/2021 1504 Last data filed at 08/27/2021 1213 Gross per 24 hour  Intake 243 ml  Output 700 ml  Net -457 ml   Filed Weights   08/26/21 0429  Weight: 65.3 kg    Examination:  Awake Alert, Oriented X 3, No new F.N deficits, Normal affect, frail Symmetrical Chest wall movement, diminished air entry with bilateral wheezing RRR,No Gallops,Rubs or new Murmurs, No Parasternal Heave +ve B.Sounds, Abd Soft, No tenderness, No rebound - guarding or rigidity. No Cyanosis, Clubbing or edema, No new Rash or bruise      Data Reviewed: I have personally reviewed following labs and imaging studies  CBC: Recent Labs  Lab 08/26/21 0415 08/26/21 0844 08/27/21 0224  WBC 20.2*  --  18.1*  NEUTROABS  --   --  14.6*  HGB 13.0 11.6* 10.2*  HCT 40.2 34.0* 31.0*  MCV 98.8  --  94.8  PLT 337  --  601    Basic Metabolic Panel: Recent Labs  Lab 08/26/21 0415 08/26/21 0844 08/27/21 0224  NA 141  137 139  K 4.3 4.0 4.2  CL 101  --  103  CO2 33*  --  30  GLUCOSE 123*  --  127*  BUN 25*  --  29*  CREATININE 1.30*  --  1.07*  CALCIUM 9.7  --  8.9    GFR: Estimated Creatinine Clearance: 44.1 mL/min (A) (by C-G formula based on SCr of 1.07 mg/dL (H)).  Liver Function Tests: No results for input(s): AST, ALT, ALKPHOS, BILITOT, PROT, ALBUMIN in the last 168 hours.  CBG: No results for input(s): GLUCAP in the last 168 hours.   No results found for this or any previous visit (from the past 240 hour(s)).       Radiology Studies: DG Thoracic Spine 2 View  Result Date: 08/26/2021 CLINICAL DATA:  Pain. EXAM: THORACIC SPINE 2 VIEWS COMPARISON:  None Available. FINDINGS: Two-view exam is limited by osteopenia. Loss of vertebral body height noted at 2 levels in the midthoracic spine likely T6 and T7, proximally 25% loss of height anteriorly at both levels. Features are age indeterminate by x-ray. No abnormal paraspinal line on the frontal projection. Possible sclerosis involving the right pedicle of the T7 vertebral body on the frontal  projection. IMPRESSION: Loss of vertebral body height at 2 levels in the midthoracic spine, likely T6 and T7, age indeterminate by x-ray. Possible sclerosis of the right T7 pedicle. MRI thoracic spine could be used to further evaluate as clinically warranted. Electronically Signed   By: Misty Stanley M.D.   On: 08/26/2021 05:59   DG Lumbar Spine Complete  Result Date: 08/26/2021 CLINICAL DATA:  Pain. EXAM: LUMBAR SPINE - COMPLETE 4+ VIEW COMPARISON:  None Available. FINDINGS: Bones are demineralized. No evidence for lumbar spine fracture or subluxation. Mild loss of disc height noted at L3-4, L4-5 common L5-S1. Mild bilateral facet degeneration noted in the lower lumbar spine. Atherosclerotic calcification characterizes the abdominal aorta. IMPRESSION: Degenerative changes in the lower lumbar spine without acute bony abnormality. Aortic Atherosclerois  (ICD10-170.0) Electronically Signed   By: Misty Stanley M.D.   On: 08/26/2021 06:01   CT Head Wo Contrast  Addendum Date: 08/26/2021   ADDENDUM REPORT: 08/26/2021 05:21 ADDENDUM: Study discussed by telephone with Dr. Ripley Fraise on 08/26/2021 at 0518 hours. Electronically Signed   By: Genevie Ann M.D.   On: 08/26/2021 05:21   Result Date: 08/26/2021 CLINICAL DATA:  67 year old female with dizziness, hypertensive (systolic 119E). Fall 3 days ago. Diaphoretic. EXAM: CT HEAD WITHOUT CONTRAST TECHNIQUE: Contiguous axial images were obtained from the base of the skull through the vertex without intravenous contrast. RADIATION DOSE REDUCTION: This exam was performed according to the departmental dose-optimization program which includes automated exposure control, adjustment of the mA and/or kV according to patient size and/or use of iterative reconstruction technique. COMPARISON:  Brain MRI 05/12/2021.  Paranasal sinus CT 10/28/2016. FINDINGS: Brain: Small volume hyperdense blood along the interhemispheric fissure, left greater than right (series 3, images 24 and 27), most likely in the subarachnoid space. No dural thickening or nodularity of the falx on February MRI. No IVH or ventriculomegaly. Basilar cisterns appear to remain normal. Fetal type PCA origins again noted. No blood along the tentorium or elsewhere. No superimposed midline shift, mass effect, evidence of mass lesion, or evidence of cortically based acute infarction. Gray-white matter differentiation is within normal limits throughout the brain. Vascular: Calcified atherosclerosis at the skull base. No suspicious intracranial vascular hyperdensity. Skull: No fracture identified. Sinuses/Orbits: Visualized paranasal sinuses and mastoids are clear. Other: No acute orbit or scalp soft tissue injury identified. IMPRESSION: 1. Positive for trace Subarachnoid Hemorrhage along the interhemispheric fissure. Favor sequelae of trauma given pattern of blood and  recent fall. 2. No skull fracture or other acute traumatic injury to the brain identified. 3. Otherwise negative for age non contrast CT appearance of the brain. Electronically Signed: By: Genevie Ann M.D. On: 08/26/2021 05:15   CT Cervical Spine Wo Contrast  Result Date: 08/26/2021 CLINICAL DATA:  66 year old female with dizziness, hypertensive (systolic 174Y). Fall 3 days ago. Diaphoretic. EXAM: CT CERVICAL SPINE WITHOUT CONTRAST TECHNIQUE: Multidetector CT imaging of the cervical spine was performed without intravenous contrast. Multiplanar CT image reconstructions were also generated. RADIATION DOSE REDUCTION: This exam was performed according to the departmental dose-optimization program which includes automated exposure control, adjustment of the mA and/or kV according to patient size and/or use of iterative reconstruction technique. COMPARISON:  Head CT today reported separately. FINDINGS: Alignment: Straightening and mild reversal of cervical lordosis. Cervicothoracic junction alignment is within normal limits. Bilateral posterior element alignment is within normal limits. Skull base and vertebrae: Visualized skull base is intact. No atlanto-occipital dissociation. C1 and C2 appear intact and aligned. Intermittent mild motion artifact. No  acute osseous abnormality identified. Soft tissues and spinal canal: No prevertebral fluid or swelling. No visible canal hematoma. Partially retropharyngeal course of both carotids in the neck (normal variant). Otherwise negative visible noncontrast neck soft tissues. Disc levels: Chronic cervical spine disc and endplate degeneration. No convincing cervical spinal stenosis. Upper chest: Osteopenia. Grossly intact visible upper thoracic levels. Emphysema. Retained secretions in the trachea at the thoracic inlet. IMPRESSION: 1. No acute traumatic injury identified in the cervical spine. 2. Emphysema (ICD10-J43.9). Retained secretions in the trachea at the thoracic inlet,  Aspiration not excluded. Electronically Signed   By: Genevie Ann M.D.   On: 08/26/2021 05:19   MR BRAIN WO CONTRAST  Result Date: 08/26/2021 CLINICAL DATA:  [place]iplopia EXAM: MRI HEAD WITHOUT CONTRAST TECHNIQUE: Multiplanar, multiecho pulse sequences of the brain and surrounding structures were obtained without intravenous contrast. COMPARISON:  CT 08/26/2021, MRI 05/12/2021 FINDINGS: Brain: There is abnormal diffusion hyperintensity with corresponding susceptibility the parasagittal sulci along the falx reflecting subarachnoid hemorrhage seen on the prior CT. No acute infarction. Ventricles and sulci are stable in size and configuration. Patchy foci of T2 hyperintensity in the supratentorial white matter are nonspecific but may reflect stable chronic microvascular ischemic changes. No mass or mass effect. There is no extra-axial collection. Vascular: Major vessel flow voids at the skull base are preserved. Skull and upper cervical spine: Normal marrow signal is preserved. Sinuses/Orbits: Paranasal sinuses are aerated. Bilateral lens replacements. Other: Sella is unremarkable.  Mastoid air cells are clear. IMPRESSION: Abnormal signal along the falx corresponding to subarachnoid hemorrhage on CT. Otherwise no significant change since prior study. Electronically Signed   By: Macy Mis M.D.   On: 08/26/2021 15:24   DG Chest Port 1 View  Result Date: 08/26/2021 CLINICAL DATA:  Shortness of breath and chest pain. EXAM: PORTABLE CHEST 1 VIEW COMPARISON:  Portable chest 06/29/2021. FINDINGS: Heart size and vascular pattern are normal. There is aortic atherosclerosis in the transverse segment, unremarkable mediastinal configuration. No vascular congestion is seen. The lungs emphysematous. There is an irregular 2 x 1.1 cm opacity developed in the right upper lobe mid perihilar area. The remaining lungs clear. No pleural collection is seen. There is thoracic spondylosis. IMPRESSION: 2 x 1.1 cm irregular opacity  developed, right upper lobe mid perihilar area. The last chest CT was 05/08/2021 and demonstrated perifissural scar-like thickening in the right upper lung field extending along the major fissure. I suspect this is probably being seen in summation given the apical lordotic positioning on this study. A small infiltrate or less likely mass are both possible in a high-risk patient. Chest CT could rule out mass if needed or a short interval follow-up study could be considered to see if this persists. Electronically Signed   By: Telford Nab M.D.   On: 08/26/2021 06:10   ECHOCARDIOGRAM COMPLETE  Result Date: 08/26/2021    ECHOCARDIOGRAM REPORT   Patient Name:   Kim [name] Date of Exam: 08/26/2021 Medical Rec #:  [id number]       Height:       61.0 in Accession #:    [id number]      Weight:       144.0 lb Date of Birth:  1956-04-06      BSA:          1.643 m Patient Age:    18 years        BP:           105/69 mmHg Patient Gender: F  HR:           91 bpm. Exam Location:  Inpatient Procedure: 2D Echo, Cardiac Doppler, Color Doppler and Intracardiac            Opacification Agent Indications:    Elevated troponin  History:        Patient has prior history of Echocardiogram examinations, most                 recent 12/19/2020. COPD.  Sonographer:    Joette Catching RCS Referring Phys: [phone] RONDELL A SMITH  Sonographer Comments: Technically difficult study due to poor echo windows. Image acquisition challenging due to COPD. IMPRESSIONS  1. Left ventricular ejection fraction, by estimation, is 35 to 40%. The left ventricle has moderately decreased function. The left ventricle demonstrates regional wall motion abnormalities (see scoring diagram/findings for description). There is hypokinesis of all mid LV segments although appears slightly worse in the mid septal LV segments. The apical LV segments appear hyperkinetic. Wall motion suggestive of atypical stress cardiomyopathy as does not appear to follow  coronary artery pattern. Clinical correlation recommended.  2. Left ventricular diastolic parameters are consistent with Grade I diastolic dysfunction (impaired relaxation).  3. Right ventricular systolic function is normal. The right ventricular size is normal.  4. The mitral valve is normal in structure. Trivial mitral valve regurgitation.  5. The aortic valve was not well visualized. Aortic valve regurgitation is mild. Aortic valve sclerosis/calcification is present, without any evidence of aortic stenosis.  6. The inferior vena cava is normal in size with greater than 50% respiratory variability, suggesting right atrial pressure of 3 mmHg. Comparison(s): Compared to prior TTE in 11/2020, there is now hyperkinesis of the LV apical segments (previously akinetic) and hypokinesis of the mid LV segments. EF now 35-40% (previously 30-35%). 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 FINDINGS  Left Ventricle: There is hypokinesis of all mid LV segments although appears slightly worse in the mid septal LV segments. The apical LV segments appear hyperkinetic. 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 Clinical correlation recommended. Left ventricular ejection fraction, by estimation, is 35 to 40%. The left ventricle has moderately decreased function. The left ventricle demonstrates regional wall motion abnormalities. Definity  contrast agent was given IV to delineate the left ventricular endocardial borders. The left ventricular internal cavity size was normal in size. There is no left ventricular hypertrophy. Left ventricular diastolic parameters are consistent with Grade I diastolic dysfunction (impaired relaxation). Right Ventricle: The right ventricular size is normal. No increase in right ventricular wall thickness. Right ventricular systolic function is normal. Left Atrium: Left atrial size was normal in size. Right  Atrium: Right atrial size was normal in size. Pericardium: There is no evidence of pericardial effusion. Mitral Valve: The mitral valve is normal in structure. Trivial mitral valve regurgitation. Tricuspid Valve: The tricuspid valve is normal in structure. Tricuspid valve regurgitation is not demonstrated. Aortic Valve: The aortic valve was not well visualized. Aortic valve regurgitation is mild. Aortic regurgitation PHT measures 275 msec. Aortic valve sclerosis/calcification is present, without any evidence of aortic stenosis. Aortic valve mean gradient measures 4.5 mmHg. Aortic valve peak gradient measures 8.7 mmHg. Aortic valve area, by VTI measures 1.99 cm. Pulmonic Valve: The pulmonic valve was not well visualized. Aorta: The aortic root and ascending aorta are structurally normal, with no evidence of dilitation. Venous: The inferior vena cava is normal in size with greater than 50% respiratory variability,  suggesting right atrial pressure of 3 mmHg. IAS/Shunts: The atrial septum is grossly normal.  LEFT VENTRICLE PLAX 2D LVIDd:         4.80 cm      Diastology LVIDs:         3.70 cm      LV e' medial:    6.31 cm/s LV PW:         1.00 cm      LV E/e' medial:  11.4 LV IVS:        0.90 cm      LV e' lateral:   5.33 cm/s LVOT diam:     1.90 cm      LV E/e' lateral: 13.5 LV SV:         52 LV SV Index:   32 LVOT Area:     2.84 cm  LV Volumes (MOD) LV vol d, MOD A2C: 120.0 ml LV vol d, MOD A4C: 114.0 ml LV vol s, MOD A2C: 83.0 ml LV vol s, MOD A4C: 66.6 ml LV SV MOD A2C:     37.0 ml LV SV MOD A4C:     114.0 ml LV SV MOD BP:      46.3 ml RIGHT VENTRICLE RV S prime:     12.80 cm/s TAPSE (M-mode): 2.2 cm LEFT ATRIUM             Index LA diam:        2.70 cm 1.64 cm/m LA Vol (A2C):   22.3 ml 13.58 ml/m LA Vol (A4C):   12.0 ml 7.31 ml/m LA Biplane Vol: 17.8 ml 10.84 ml/m  AORTIC VALVE                     PULMONIC VALVE AV Area (Vmax):    2.06 cm      PV Vmax:       1.26 m/s AV Area (Vmean):   2.06 cm      PV Peak  grad:  6.4 mmHg AV Area (VTI):     1.99 cm AV Vmax:           147.50 cm/s AV Vmean:          103.800 cm/s AV VTI:            0.263 m AV Peak Grad:      8.7 mmHg AV Mean Grad:      4.5 mmHg LVOT Vmax:         107.00 cm/s LVOT Vmean:        75.400 cm/s LVOT VTI:          0.185 m LVOT/AV VTI ratio: 0.70 AI PHT:            275 msec  AORTA Ao Root diam: 2.80 cm Ao Asc diam:  2.60 cm MITRAL VALVE                TRICUSPID VALVE MV Area (PHT): 8.92 cm     TR Peak grad:   26.0 mmHg MV Decel Time: 85 msec      TR Vmax:        255.00 cm/s MV E velocity: 72.20 cm/s MV A velocity: 103.00 cm/s  SHUNTS MV E/A ratio:  0.70         Systemic VTI:  0.18 m                             Systemic Diam:  1.90 cm Gwyndolyn Kaufman MD Electronically signed by Gwyndolyn Kaufman MD Signature Date/Time: 08/26/2021/4:08:17 PM    Final         Scheduled Meds:  arformoterol  15 mcg Nebulization BID   azithromycin  500 mg Oral Daily   budesonide (PULMICORT) nebulizer solution  0.5 mg Nebulization BID   guaiFENesin  1,200 mg Oral Q12H   ipratropium-albuterol  3 mL Nebulization QID   lidocaine  1 patch Transdermal Q24H   loratadine  10 mg Oral Daily   losartan  25 mg Oral Daily   magnesium oxide  400 mg Oral Daily   methylPREDNISolone (SOLU-MEDROL) injection  120 mg Intravenous Daily   metoprolol succinate  12.5 mg Oral Daily   olopatadine  1 drop Left Eye Daily   PARoxetine  30 mg Oral Daily   sodium chloride flush  3 mL Intravenous Q12H   Continuous Infusions:   LOS: 1 day      Phillips Climes, MD [facility]   To contact the attending provider between 7A-7P or the covering provider during after hours 7P-7A, please log into the web site [email or web address] and access using universal [place] password for that web site. If you do not have the password, please call the hospital operator.  08/27/2021, 3:04 PM

## 2021-08-27 NOTE — Progress Notes (Signed)
   No new recommendations today.  Please see consult note from yesterday evening.  Continue to monitor.  Candee Furbish, MD

## 2021-08-27 NOTE — Evaluation (Signed)
Physical Therapy Evaluation Patient Details Name: Kim [name] MRN: [id number] DOB: 1955-12-07 Today's Date: 08/27/2021  History of Present Illness  Pt is a 66 y.o. female admitted due to falling backwards and lost consciousness. Pt has reported a headache since the fall and change4s in memory.  CT showed trace subarachnoid hemorrhage. PMH includes COPD (2L O2 baseline), HTN, cardiomyopathy, osteoporosis, anxiety, depression.  Clinical Impression  PTA pt's husband was assisting with ambulation outside home and with climbing stairs to her apartment. Pt had anxiety medication and breathing treatment prior to session and was able to ambulate on 3L O2 without hyperventilation or increased anxiety. Pt is min guard for bed mobility and transfers and min A for ambulation in room. Pt reports needing to use bathroom however experiences 9/10 throbbing headache when she reaches bathroom and request return to bed to use Purewick. Pt would benefit from short bout of SNF level rehab prior to return home for improved stability with gait. PT will continue to follow acutely.       Recommendations for follow up therapy are one component of a multi-disciplinary discharge planning process, led by the attending physician.  Recommendations may be updated based on patient status, additional functional criteria and insurance authorization.  Follow Up Recommendations Skilled nursing-short term rehab (<3 hours/day)    Assistance Recommended at Discharge Frequent or constant Supervision/Assistance  Patient can return home with the following  A little help with walking and/or transfers;A little help with bathing/dressing/bathroom;Assistance with cooking/housework;Direct supervision/assist for medications management;Direct supervision/assist for financial management;Assist for transportation;Help with stairs or ramp for entrance    Equipment Recommendations None recommended by PT  Recommendations for Other Services        Functional Status Assessment Patient has had a recent decline in their functional status and demonstrates the ability to make significant improvements in function in a reasonable and predictable amount of time.     Precautions / Restrictions Precautions Precautions: Fall;Other (comment) Precaution Comments: anxious Restrictions Weight Bearing Restrictions: No      Mobility  Bed Mobility Overal bed mobility: Needs Assistance Bed Mobility: Supine to Sit, Sit to Supine     Supine to sit: Min guard Sit to supine: Min guard        Transfers Overall transfer level: Needs assistance Equipment used: Rolling walker (2 wheels) Transfers: Sit to/from Stand Sit to Stand: Min guard                Ambulation/Gait Ambulation/Gait assistance: Herbalist (Feet): 18 Feet Assistive device:  (pushed O2 tank, felt more secure even though it was cumbersome to push) Gait Pattern/deviations: Step-through pattern, Decreased step length - right, Decreased step length - left, Shuffle Gait velocity: slowed Gait velocity interpretation: <1.31 ft/sec, indicative of household ambulator   General Gait Details: min A for steadying with slowed, shuffling gait, pt prefers to push O2 tank and reach out for counter and wall, no overt LoB, c/o of dizziness and 9/10 pain in head with walking      Balance Overall balance assessment: Needs assistance Sitting-balance support: Feet supported Sitting balance-Leahy Scale: Fair     Standing balance support: Bilateral upper extremity supported, Single extremity supported, During functional activity Standing balance-Leahy Scale: Fair                               Pertinent Vitals/Pain Pain Assessment Pain Assessment: 0-10 Pain Score: 9  (9 with ambulation, 5 at baseline)  Pain Location: head Pain Descriptors / Indicators: Aching, Discomfort Pain Intervention(s): Limited activity within patient's tolerance, Monitored  during session, Repositioned    Home Living Family/patient expects to be discharged to:: Private residence Living Arrangements: Spouse/significant other Available Help at Discharge: Family;Available 24 hours/day Type of Home: Apartment Home Access: Stairs to enter Entrance Stairs-Rails: Left Entrance Stairs-Number of Steps: 14   Home Layout: One level Home Equipment: Rollator (4 wheels) Additional Comments: lives in garage apartment    Prior Function Prior Level of Function : Independent/Modified Independent             Mobility Comments: Per pt reported they had to have RW or use of hand held assistance from husband due to unable to be able to have walker in home in bathroom and some walkways       Hand Dominance   Dominant Hand: Right    Extremity/Trunk Assessment   Upper Extremity Assessment Upper Extremity Assessment: Defer to OT evaluation RUE Deficits / Details: slight discomfort due to fall RUE Sensation: WNL RUE Coordination: WNL    Lower Extremity Assessment Lower Extremity Assessment: Overall WFL for tasks assessed    Cervical / Trunk Assessment Cervical / Trunk Assessment: Kyphotic  Communication   Communication: No difficulties  Cognition Arousal/Alertness: Awake/alert Behavior During Therapy: Anxious Overall Cognitive Status: Impaired/Different from baseline Area of Impairment: Awareness, Problem solving, Safety/judgement                         Safety/Judgement: Decreased awareness of safety   Problem Solving: Requires verbal cues, Requires tactile cues General Comments: had anxiety medication prior to treatment, but is still nervous about ambulation        General Comments General comments (skin integrity, edema, etc.): BP in seated 130/65, with ambulation 143/78, SpO2 on 3L O2 via [place] SpO@ >92%O2 throughout, 2/4 DoE        Assessment/Plan    PT Assessment Patient needs continued PT services  PT Problem List Decreased  activity tolerance;Decreased mobility;Pain       PT Treatment Interventions DME instruction;Gait training;Stair training;Functional mobility training;Therapeutic activities;Therapeutic exercise;Balance training;Cognitive remediation;Patient/family education    PT Goals (Current goals can be found in the Care Plan section)  Acute Rehab PT Goals Patient Stated Goal: be able to get around without having headache PT Goal Formulation: With patient Time For Goal Achievement: 09/10/21 Potential to Achieve Goals: Fair    Frequency Min 4X/week        AM-PAC PT "6 Clicks" Mobility  Outcome Measure Help needed turning from your back to your side while in a flat bed without using bedrails?: None Help needed moving from lying on your back to sitting on the side of a flat bed without using bedrails?: None Help needed moving to and from a bed to a chair (including a wheelchair)?: A Little Help needed standing up from a chair using your arms (e.g., wheelchair or bedside chair)?: A Little Help needed to walk in hospital room?: A Little Help needed climbing 3-5 steps with a railing? : Total 6 Click Score: 18    End of Session Equipment Utilized During Treatment: Gait belt;Oxygen Activity Tolerance: Patient limited by pain (headache with ambulation) Patient left: in bed;with call bell/phone within reach;with bed alarm set Nurse Communication: Mobility status PT Visit Diagnosis: Unsteadiness on feet (R26.81);Other abnormalities of gait and mobility (R26.89);Muscle weakness (generalized) (M62.81);Repeated falls (R29.6);History of falling (Z91.81);Difficulty in walking, not elsewhere classified (R26.2);Other symptoms and signs involving the nervous  system (R29.898);Dizziness and giddiness (R42)    Time: 9539-6728 PT Time Calculation (min) (ACUTE ONLY): 23 min   Charges:   PT Evaluation $PT Eval Moderate Complexity: 1 Mod PT Treatments $Gait Training: 8-22 mins        Yocelin Vanlue B. Migdalia Dk  PT, DPT Acute Rehabilitation Services Please use secure chat or  Call Office [phone]   [place] 08/27/2021, 2:06 PM

## 2021-08-27 NOTE — Evaluation (Signed)
Occupational Therapy Evaluation Patient Details Name: Kim [name] MRN: [id number] DOB: 04-13-1955 Today's Date: 08/27/2021   History of Present Illness Pt is a 66 y.o. female admitted due to falling backwards and lost consciousness. Pt has reported a headache since the fall and changes in memory.  CT showed trace subarachnoid hemorrhage. PMH includes COPD (2L O2 baseline), HTN, cardiomyopathy, osteoporosis, anxiety, depression.   Clinical Impression   Pt at PLOF was requiring assistance from husband in the home as they can not use RW in all areas of the home. Pt was limited in this session as became very anxious and requesting anxiety medications and breathing treatment. Pt noted with any changes in position and into standing they reported increase in head pain and increase in tremors in BUE. Pt was able to complete transfer with FW and min guard to [employer] [facility] [employer] and completed hygiene. Pt noted needing cues far safety, needed increase in time for processing some information and self reported noted decrease in word finding. Pt currently with functional limitations due to the deficits listed below (see OT Problem List).  Pt will benefit from skilled OT to increase their safety and independence with ADL and functional mobility for ADL to facilitate discharge to venue listed below.        Recommendations for follow up therapy are one component of a multi-disciplinary discharge planning process, led by the attending physician.  Recommendations may be updated based on patient status, additional functional criteria and insurance authorization.   Follow Up Recommendations  Skilled nursing-short term rehab (<3 hours/day) (Pt may be able to progress down to [facility] level.)    Assistance Recommended at Discharge Frequent or constant Supervision/Assistance  Patient can return home with the following A little help with walking and/or transfers;A little help with bathing/dressing/bathroom;Assistance with  cooking/housework;Assist for transportation    Functional Status Assessment  Patient has had a recent decline in their functional status and demonstrates the ability to make significant improvements in function in a reasonable and predictable amount of time.  Equipment Recommendations  Tub/shower bench    Recommendations for Other Services       Precautions / Restrictions Precautions Precautions: Fall;Other (comment) Precaution Comments: anxious      Mobility Bed Mobility Overal bed mobility: Needs Assistance Bed Mobility: Supine to Sit, Sit to Supine     Supine to sit: Min guard Sit to supine: Min guard        Transfers Overall transfer level: Needs assistance Equipment used: Rolling walker (2 wheels) Transfers: Sit to/from Stand Sit to Stand: Min guard                  Balance Overall balance assessment: Needs assistance Sitting-balance support: Feet supported Sitting balance-Leahy Scale: Fair     Standing balance support: Bilateral upper extremity supported, Single extremity supported, During functional activity Standing balance-Leahy Scale: Fair                             ADL either performed or assessed with clinical judgement   ADL Overall ADL's : Needs assistance/impaired Eating/Feeding: Set up;Sitting   Grooming: Wash/dry hands;Wash/dry face;Set up;Sitting   Upper Body Bathing: Min guard;Sitting   Lower Body Bathing: Cueing for safety;Cueing for sequencing;Sit to/from stand;Min guard   Upper Body Dressing : Min guard;Cueing for safety;Cueing for sequencing;Sitting;Standing   Lower Body Dressing: Min guard;Cueing for safety;Cueing for sequencing;Sit to/from stand   Toilet Transfer: Cueing for safety;Cueing for sequencing;Minimal assistance;BSC/3in1  Toileting- Clothing Manipulation and Hygiene: Min guard;Cueing for safety;Cueing for sequencing               Vision Baseline Vision/History: 1 Wears glasses Patient  Visual Report:  (Per pt reported in [place] has double vision in B eyes but had had changes in L eye prior to fall as per opthomalgist reported had a small strok behind the eye prior) Vision Assessment?: Vision impaired- to be further tested in functional context     Perception     Praxis      Pertinent Vitals/Pain Pain Assessment Pain Assessment: 0-10 Pain Score: 8  Pain Location: head Pain Descriptors / Indicators: Aching, Discomfort Pain Intervention(s): Limited activity within patient's tolerance, Monitored during session, Repositioned     Hand Dominance Right   Extremity/Trunk Assessment Upper Extremity Assessment Upper Extremity Assessment: Overall WFL for tasks assessed;RUE deficits/detail (some discomfort at R elbow due to fall) RUE Deficits / Details: slight discomfort due to fall RUE Sensation: WNL RUE Coordination: WNL   Lower Extremity Assessment Lower Extremity Assessment: Defer to PT evaluation   Cervical / Trunk Assessment Cervical / Trunk Assessment: Kyphotic   Communication Communication Communication: No difficulties   Cognition Arousal/Alertness: Awake/alert Behavior During Therapy: Anxious Overall Cognitive Status: Impaired/Different from baseline Area of Impairment: Awareness, Problem solving, Safety/judgement                         Safety/Judgement: Decreased awareness of safety   Problem Solving: Requires verbal cues, Requires tactile cues General Comments: difficulties in word finding     General Comments       Exercises     Shoulder Instructions      Home Living Family/patient expects to be discharged to:: Private residence Living Arrangements: Spouse/significant other Available Help at Discharge: Family;Available 24 hours/day Type of Home: Apartment Home Access: Stairs to enter Entrance Stairs-Number of Steps: 14 Entrance Stairs-Rails: Left Home Layout: One level     Bathroom Shower/Tub: Engineer, site: Standard     Home Equipment: Rollator (4 wheels)   Additional Comments: lives in garage apartment      Prior Functioning/Environment Prior Level of Function : Independent/Modified Independent             Mobility Comments: Per pt reported they had to have RW or use of hand held assistance from husband due to unable to be able to have walker in home in bathroom and some walkways          OT Problem List: Decreased strength;Decreased activity tolerance;Impaired balance (sitting and/or standing);Decreased safety awareness;Decreased knowledge of use of DME or AE;Pain      OT Treatment/Interventions: Self-care/ADL training;Therapeutic activities;Patient/family education;Balance training;Visual/perceptual remediation/compensation    OT Goals(Current goals can be found in the care plan section) Acute Rehab OT Goals Patient Stated Goal: to have less pain with movement OT Goal Formulation: With patient Time For Goal Achievement: 09/10/21 Potential to Achieve Goals: Good ADL Goals Pt Will Perform Lower Body Bathing: with modified independence Pt Will Perform Lower Body Dressing: with modified independence Pt Will Transfer to Toilet: with modified independence;ambulating Pt Will Perform Tub/Shower Transfer: Tub transfer;with modified independence;tub bench;ambulating  OT Frequency: Min 2X/week    Co-evaluation              AM-PAC OT "6 Clicks" Daily Activity     Outcome Measure Help from another person eating meals?: A Little Help from another person taking care of personal grooming?: A  Little Help from another person toileting, which includes using toliet, bedpan, or urinal?: A Little Help from another person bathing (including washing, rinsing, drying)?: A Little Help from another person to put on and taking off regular upper body clothing?: A Little Help from another person to put on and taking off regular lower body clothing?: A Little 6 Click Score: 18   End  of Session Equipment Utilized During Treatment: Gait belt;Rolling walker (2 wheels) Nurse Communication: Mobility status  Activity Tolerance: Other (comment) (anxiety) Patient left: in bed;with call bell/phone within reach;with bed alarm set  OT Visit Diagnosis: Unsteadiness on feet (R26.81);Other abnormalities of gait and mobility (R26.89);Pain Pain - Right/Left:  (head)                Time: 7078-6754 OT Time Calculation (min): 35 min Charges:  OT General Charges $OT Visit: 1 Visit OT Evaluation $OT Eval Low Complexity: 1 Low OT Treatments $Self Care/Home Management : 8-22 mins  Joeseph Amor OTR/L  Acute Rehab Services  [phone] office number [phone] pager number   Joeseph Amor 08/27/2021, 12:20 PM

## 2021-08-28 ENCOUNTER — Inpatient Hospital Stay (HOSPITAL_COMMUNITY): Payer: Medicare Other

## 2021-08-28 DIAGNOSIS — J9621 Acute and chronic respiratory failure with hypoxia: Secondary | ICD-10-CM | POA: Diagnosis not present

## 2021-08-28 DIAGNOSIS — J441 Chronic obstructive pulmonary disease with (acute) exacerbation: Secondary | ICD-10-CM | POA: Diagnosis not present

## 2021-08-28 DIAGNOSIS — H532 Diplopia: Secondary | ICD-10-CM | POA: Diagnosis not present

## 2021-08-28 DIAGNOSIS — F419 Anxiety disorder, unspecified: Secondary | ICD-10-CM | POA: Diagnosis not present

## 2021-08-28 LAB — PHOSPHORUS: Phosphorus: 2 mg/dL — ABNORMAL LOW (ref 2.5–4.6)

## 2021-08-28 LAB — CBC
HCT: 31.2 % — ABNORMAL LOW (ref 36.0–46.0)
Hemoglobin: 10.1 g/dL — ABNORMAL LOW (ref 12.0–15.0)
MCH: 31.4 pg (ref 26.0–34.0)
MCHC: 32.4 g/dL (ref 30.0–36.0)
MCV: 96.9 fL (ref 80.0–100.0)
Platelets: 267 10*3/uL (ref 150–400)
RBC: 3.22 MIL/uL — ABNORMAL LOW (ref 3.87–5.11)
RDW: 14.6 % (ref 11.5–15.5)
WBC: 17.3 10*3/uL — ABNORMAL HIGH (ref 4.0–10.5)
nRBC: 0 % (ref 0.0–0.2)

## 2021-08-28 LAB — BASIC METABOLIC PANEL
Anion gap: 7 (ref 5–15)
BUN: 25 mg/dL — ABNORMAL HIGH (ref 8–23)
CO2: 28 mmol/L (ref 22–32)
Calcium: 9 mg/dL (ref 8.9–10.3)
Chloride: 106 mmol/L (ref 98–111)
Creatinine, Ser: 1.12 mg/dL — ABNORMAL HIGH (ref 0.44–1.00)
GFR, Estimated: 54 mL/min — ABNORMAL LOW (ref >60)
Glucose, Bld: 120 mg/dL — ABNORMAL HIGH (ref 70–99)
Potassium: 4.1 mmol/L (ref 3.5–5.1)
Sodium: 141 mmol/L (ref 135–145)

## 2021-08-28 LAB — MAGNESIUM: Magnesium: 2.1 mg/dL (ref 1.7–2.4)

## 2021-08-28 MED ORDER — METHYLPREDNISOLONE SODIUM SUCC 125 MG IJ SOLR
80.0000 mg | INTRAMUSCULAR | Status: DC
  Administered 2021-08-29: 80 mg via INTRAVENOUS
  Filled 2021-08-28: qty 2

## 2021-08-28 NOTE — NC FL2 (Signed)
[place] LEVEL OF CARE SCREENING TOOL     IDENTIFICATION  Patient Name: Kim [name] Birthdate: September 22, 1955 Sex: female Admission Date (Current Location): 08/26/2021  [facility] and [place] Number:  Herbalist and Address:  [facility] [place] [address], [place], [place] [address]      Provider Number: [id number]  Attending Physician Name and Address:  Jonetta Osgood, MD  Relative Name and Phone Number:       Current Level of Care: Hospital Recommended Level of Care: [place] Prior Approval Number:    Date Approved/Denied:   PASRR Number: [id number]  Discharge Plan: SNF    Current Diagnoses: Patient Active Problem List   Diagnosis Date Noted   COPD with acute exacerbation ([place]) 08/26/2021   Double vision 08/26/2021   SIRS (systemic inflammatory response syndrome) ([place]) 08/26/2021   AKI (acute kidney injury) ([place]) 08/26/2021   Postconcussive syndrome 08/26/2021   Elevated troponin 08/26/2021   Subarachnoid hemorrhage ([place]) 08/26/2021   Syncope and collapse 08/26/2021   Heart failure with preserved ejection fraction ([place]) 08/26/2021   Acute on chronic respiratory failure with hypoxia and hypercapnia ([place]) 06/26/2021   NSTEMI (non-ST elevated myocardial infarction) ([place]) 11/17/2020   Osteoporosis 06/05/2020   Chronic diastolic CHF (congestive heart failure) ([place]) 03/24/2020   Essential hypertension 03/24/2020   Vertebral fracture, osteoporotic ([place]) 03/05/2020   History of colonic polyps    Benign neoplasm of ascending colon    Neuritis 01/29/2017   Alpha-1-antitrypsin deficiency carrier 01/19/2017   Dependent edema 01/14/2017   Orthostatic hypotension    Allergic rhinitis 08/10/2015   Acute on chronic respiratory failure with hypoxia (HCC) 07/05/2015   Hyperglycemia    HLD (hyperlipidemia)    Coronary artery calcification seen on CAT scan 07/02/2015   Solitary pulmonary nodule 12/02/2013    Loss of weight 11/10/2013   Osteoarthritis 06/05/2009   Brachial neuritis or radiculitis 06/05/2009   Dyslipidemia 03/12/2009   NEPHROLITHIASIS 01/05/2008   COPD GOLD III  08/31/2007   PARESTHESIA 08/24/2007   Anxiety and depression 11/19/2006    Orientation RESPIRATION BLADDER Height & Weight     Self, Time, Situation, Place  O2 (Nasal cannula 3L) Continent, External catheter Weight: 144 lb (65.3 kg) Height:  '5\' 1"'$  (154.9 cm)  BEHAVIORAL SYMPTOMS/MOOD NEUROLOGICAL BOWEL NUTRITION STATUS      Continent Diet (Please see DC Summary)  AMBULATORY STATUS COMMUNICATION OF NEEDS Skin   Limited Assist Verbally Normal                       Personal Care Assistance Level of Assistance  Bathing, Feeding, Dressing Bathing Assistance: Limited assistance Feeding assistance: Independent Dressing Assistance: Limited assistance     Functional Limitations Info             SPECIAL CARE FACTORS FREQUENCY  PT (By licensed PT), OT (By licensed OT)     PT Frequency: 5x/week OT Frequency: 5x/week            Contractures Contractures Info: Not present    Additional Factors Info  Code Status, Allergies Code Status Info: Full Allergies Info: Tape, Daliresp (Roflumilast), Latex, Lisinopril, Morphine And Related, Penicillins, Omnicef (Cefdinir)           Current Medications (08/28/2021):  This is the current hospital active medication list Current Facility-Administered Medications  Medication Dose Route Frequency Provider Last Rate Last Admin   acetaminophen (TYLENOL) tablet 650 mg  650 mg Oral Q6H  PRN Norval Morton, MD   650 mg at 08/26/21 1553   Or   acetaminophen (TYLENOL) suppository 650 mg  650 mg Rectal Q6H PRN Fuller Plan A, MD       albuterol (PROVENTIL) (2.5 MG/3ML) 0.083% nebulizer solution 2.5 mg  2.5 mg Nebulization Q2H PRN Tamala Julian, Rondell A, MD   2.5 mg at 08/28/21 0139   ALPRAZolam (XANAX) tablet 0.5 mg  0.5 mg Oral TID PRN Fuller Plan A, MD   0.5 mg at  08/27/21 2018   arformoterol (BROVANA) nebulizer solution 15 mcg  15 mcg Nebulization BID Fuller Plan A, MD   15 mcg at 08/28/21 0830   azithromycin (ZITHROMAX) tablet 500 mg  500 mg Oral Daily Smith, Rondell A, MD   500 mg at 08/28/21 0830   budesonide (PULMICORT) nebulizer solution 0.5 mg  0.5 mg Nebulization BID Fuller Plan A, MD   0.5 mg at 08/28/21 0830   butalbital-acetaminophen-caffeine (FIORICET) 50-325-40 MG per tablet 1 tablet  1 tablet Oral Q6H PRN Fuller Plan A, MD   1 tablet at 08/27/21 0923   guaiFENesin (MUCINEX) 12 hr tablet 1,200 mg  1,200 mg Oral Q12H Smith, Rondell A, MD   1,200 mg at 08/28/21 0830   hydrALAZINE (APRESOLINE) injection 10 mg  10 mg Intravenous Q4H PRN Fuller Plan A, MD       HYDROcodone-acetaminophen (NORCO) 7.5-325 MG per tablet 1 tablet  1 tablet Oral BID PRN Fuller Plan A, MD   1 tablet at 08/27/21 2018   ipratropium-albuterol (DUONEB) 0.5-2.5 (3) MG/3ML nebulizer solution 3 mL  3 mL Nebulization QID Smith, Rondell A, MD   3 mL at 08/28/21 1117   lidocaine (LIDODERM) 5 % 1 patch  1 patch Transdermal Q24H Smith, Rondell A, MD   1 patch at 08/27/21 1147   loratadine (CLARITIN) tablet 10 mg  10 mg Oral Daily Smith, Rondell A, MD   10 mg at 08/28/21 4818   LORazepam (ATIVAN) injection 0.5 mg  0.5 mg Intravenous Once PRN Fuller Plan A, MD       losartan (COZAAR) tablet 25 mg  25 mg Oral Daily Smith, Rondell A, MD   25 mg at 08/28/21 0830   magnesium oxide (MAG-OX) tablet 400 mg  400 mg Oral Daily Smith, Rondell A, MD   400 mg at 08/28/21 5631   methylPREDNISolone sodium succinate (SOLU-MEDROL) 125 mg/2 mL injection 120 mg  120 mg Intravenous Daily Elgergawy, Silver Huguenin, MD   120 mg at 08/28/21 0831   metoprolol succinate (TOPROL-XL) 24 hr tablet 12.5 mg  12.5 mg Oral Daily Smith, Rondell A, MD   12.5 mg at 08/28/21 4970   nystatin (MYCOSTATIN) 100000 UNIT/ML suspension 500,000 Units  5 mL Oral TID PRN Fuller Plan A, MD       olopatadine (PATANOL)  0.1 % ophthalmic solution 1 drop  1 drop Left Eye Daily Tamala Julian, Rondell A, MD   1 drop at 08/28/21 2637   PARoxetine (PAXIL) tablet 30 mg  30 mg Oral Daily Smith, Rondell A, MD   30 mg at 08/28/21 0830   sodium chloride flush (NS) 0.9 % injection 3 mL  3 mL Intravenous Q12H Smith, Rondell A, MD   3 mL at 08/28/21 0831     Discharge Medications: Please see discharge summary for a list of discharge medications.  Relevant Imaging Results:  Relevant Lab Results:   Additional Information SSn: [id number].  [place] COVID-19 Vaccine 07/25/2019 , 06/29/2019  Benard Halsted,  LCSW

## 2021-08-28 NOTE — Progress Notes (Signed)
PROGRESS NOTE        PATIENT DETAILS Name: Kim [name] Age: 66 y.o. Sex: female Date of Birth: 17-Apr-1955 Admit Date: 08/26/2021 Admitting Physician Vernelle Emerald, MD QVZ:DGLOVFIE, Tommi Rumps, NP  Brief Summary: Patient is a 66 y.o.  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   Significant events: 5/22>> admit to [facility]-subdural hematoma-COPD exacerbation  Significant studies: 5/22>> CT head: Trace SDH 5/22>> CT C-spine: No acute traumatic injury identified 5/22>> MRI brain: Abnormal signal along the falx-corresponding to [facility]. 5/22>> 2 x 1.1 cm irregular opacity in the right upper mid lobe mid perihilar area 5/22>> Echo: EF 35-40%  Significant microbiology data:   Procedures:   Consults: Cardiology, neurosurgery  Subjective: Lying comfortably in bed-denies any chest pain or shortness of breath.  Feels better than yesterday.  Objective: Vitals: Blood pressure 117/67, pulse 77, temperature 98 F (36.7 C), temperature source Oral, resp. rate 20, height '5\' 1"'$  (1.549 m), weight 65.3 kg, SpO2 97 %.   Exam: Gen Exam:Alert awake-not in any distress HEENT:atraumatic, normocephalic Chest: B/L clear to auscultation anteriorly CVS:S1S2 regular Abdomen:soft non tender, non distended Extremities:no edema Neurology: Non focal Skin: no rash  Pertinent Labs/Radiology:    Latest Ref Rng & Units 08/28/2021    4:33 AM 08/27/2021    2:24 AM 08/26/2021    8:44 AM  CBC  WBC 4.0 - 10.5 K/uL 17.3   18.1     Hemoglobin 12.0 - 15.0 g/dL 10.1   10.2   11.6    Hematocrit 36.0 - 46.0 % 31.2   31.0   34.0    Platelets 150 - 400 K/uL 267   274       Lab Results  Component Value Date   NA 141 08/28/2021    K 4.1 08/28/2021   CL 106 08/28/2021   CO2 28 08/28/2021      Assessment/Plan: Acute on chronic hypoxic/hypercapnic respiratory failure due to COPD exacerbation: Clinically improving-continue bronchodilators-tapering steroids and empiric antibiotics.  Titrate down to her usual regimen of 2 L of oxygen as tolerated.  Traumatic SAH/SDH: Evaluated by neurosurgery-nonsurgical management recommended.  Syncope: Telemetry negative-echo with EF 35-40%.  No further work-up recommended by cardiology.  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  Outpatient repeat echocardiogram recommended by cardiology.  No evidence of CAD by cath August 2022.  Volume status is stable.  Diplopia: Present prior to this hospitalization-she is being followed by ophthalmology in the outpatient setting.  HTN: BP relatively stable-continue losartan, metoprolol-follow and optimize.  Anxiety/depression: Stable-continue as needed Xanax and paroxetine.  2 x 1.1 cm opacity in right upper middle lobe: Obtain CT chest for further characterization.   T6/T7 compression fractures: No severe back pain-likely chronic issues-incidental finding during this hospitalization.  Osteoporosis work-up deferred to the outpatient setting.  BMI: Estimated body mass index is 27.21 kg/m as calculated from the following:   Height as of this encounter: '5\' 1"'$  (1.549 m).   Weight as of this encounter: 65.3 kg.   Code status:   Code Status: Full Code   DVT Prophylaxis: Place TED hose Start: 08/27/21 1019 SCDs Start: 08/26/21 0748    Family Communication: None at bedside  Disposition Plan: Status is: Inpatient Remains inpatient appropriate because: Resolving COPD exacerbation-probably requires another 24 hours of monitoring inpatient-tapering steroids-before discharge to SNF.   Planned Discharge Destination:Skilled nursing facility possibly on 5/25   Diet: Diet Order             Diet Heart Room service  appropriate? Yes; Fluid consistency: Thin  Diet effective now                     Antimicrobial agents: Anti-infectives (From admission, onward)    Start     Dose/Rate Route Frequency Ordered Stop   08/27/21 0830  azithromycin (ZITHROMAX) tablet 500 mg       See Hyperspace for full Linked Orders Report.   500 mg Oral Daily 08/26/21 0756 08/31/21 0959   08/26/21 0830  azithromycin (ZITHROMAX) 500 mg in sodium chloride 0.9 % 250 mL IVPB       See Hyperspace for full Linked Orders Report.   500 mg 250 mL/hr over 60 Minutes Intravenous Every 24 hours 08/26/21 0756 08/26/21 1120        MEDICATIONS: Scheduled Meds:  arformoterol  15 mcg Nebulization BID   azithromycin  500 mg Oral Daily   budesonide (PULMICORT) nebulizer solution  0.5 mg Nebulization BID   guaiFENesin  1,200 mg Oral Q12H   ipratropium-albuterol  3 mL Nebulization QID   lidocaine  1 patch Transdermal Q24H   loratadine  10 mg Oral Daily   losartan  25 mg Oral Daily   magnesium oxide  400 mg Oral Daily   methylPREDNISolone (SOLU-MEDROL) injection  120 mg Intravenous Daily   metoprolol succinate  12.5 mg Oral Daily   olopatadine  1 drop Left Eye Daily   PARoxetine  30 mg Oral Daily   sodium chloride flush  3 mL Intravenous Q12H   Continuous Infusions: PRN Meds:.acetaminophen **OR** acetaminophen, albuterol, ALPRAZolam, butalbital-acetaminophen-caffeine, hydrALAZINE, HYDROcodone-acetaminophen, LORazepam, nystatin   I have personally reviewed following labs and imaging studies  LABORATORY DATA: CBC: Recent Labs  Lab 08/26/21 0415 08/26/21 0844 08/27/21 0224 08/28/21 0433  WBC 20.2*  --  18.1* 17.3*  NEUTROABS  --   --  14.6*  --   HGB 13.0 11.6* 10.2* 10.1*  HCT 40.2 34.0* 31.0* 31.2*  MCV 98.8  --  94.8 96.9  PLT 337  --  274 419    Basic Metabolic Panel: Recent Labs  Lab 08/26/21 0415 08/26/21 0844 08/27/21 0224 08/28/21 0433  NA 141 137 139 141  K 4.3 4.0 4.2 4.1  CL 101  --  103 106   CO2 33*  --  30 28  GLUCOSE 123*  --  127* 120*  BUN 25*  --  29* 25*  CREATININE 1.30*  --  1.07* 1.12*  CALCIUM 9.7  --  8.9 9.0  MG  --   --   --  2.1  PHOS  --   --   --  2.0*    GFR: Estimated Creatinine Clearance: 42.2 mL/min (A) (by C-G formula based on SCr of 1.12 mg/dL (H)).  Liver Function Tests: No results for input(s): AST, ALT, ALKPHOS, BILITOT, PROT, ALBUMIN in the last 168 hours. No results for input(s): LIPASE, AMYLASE in the last 168 hours. No results for input(s): AMMONIA in the last 168 hours.  Coagulation Profile: Recent Labs  Lab 08/26/21 0558  INR 0.9    Cardiac Enzymes: No results for input(s): CKTOTAL, CKMB, CKMBINDEX, TROPONINI in the last 168 hours.  BNP (last  3 results) No results for input(s): PROBNP in the last 8760 hours.  Lipid Profile: No results for input(s): CHOL, HDL, LDLCALC, TRIG, CHOLHDL, LDLDIRECT in the last 72 hours.  Thyroid Function Tests: Recent Labs    08/26/21 1543  TSH 0.373    Anemia Panel: No results for input(s): VITAMINB12, FOLATE, FERRITIN, TIBC, IRON, RETICCTPCT in the last 72 hours.  Urine analysis:    Component Value Date/Time   COLORURINE YELLOW 08/26/2021 0805   APPEARANCEUR CLEAR 08/26/2021 0805   LABSPEC 1.021 08/26/2021 0805   PHURINE 5.0 08/26/2021 0805   GLUCOSEU NEGATIVE 08/26/2021 0805   HGBUR NEGATIVE 08/26/2021 0805   HGBUR trace-lysed 02/26/2010 0951   BILIRUBINUR NEGATIVE 08/26/2021 0805   BILIRUBINUR negative 04/29/2021 1243   KETONESUR NEGATIVE 08/26/2021 0805   PROTEINUR NEGATIVE 08/26/2021 0805   UROBILINOGEN 0.2 04/29/2021 1243   UROBILINOGEN 0.2 02/26/2010 0951   NITRITE NEGATIVE 08/26/2021 0805   LEUKOCYTESUR NEGATIVE 08/26/2021 0805    Sepsis Labs: Lactic Acid, Venous    Component Value Date/Time   LATICACIDVEN 2.1 ([place]) 04/03/2020 0538    MICROBIOLOGY: No results found for this or any previous visit (from the past 240 hour(s)).  RADIOLOGY STUDIES/RESULTS: MR BRAIN WO  CONTRAST  Result Date: 08/26/2021 CLINICAL DATA:  [place]ipl[place] EXAM: MRI HEAD WITHOUT CONTRAST TECHNIQUE: Multiplanar, multiecho pulse sequences of the brain and surrounding structures were obtained without intravenous contrast. COMPARISON:  CT 08/26/2021, MRI 05/12/2021 FINDINGS: Brain: There is abnormal diffusion hyperintensity with corresponding susceptibility the parasagittal sulci along the falx reflecting subarachnoid hemorrhage seen on the prior CT. No acute infarction. Ventricles and sulci are stable in size and configuration. Patchy foci of T2 hyperintensity in the supratentorial white matter are nonspecific but may reflect stable chronic microvascular ischemic changes. No mass or mass effect. There is no extra-axial collection. Vascular: Major vessel flow voids at the skull base are preserved. Skull and upper cervical spine: Normal marrow signal is preserved. Sinuses/Orbits: Paranasal sinuses are aerated. Bilateral lens replacements. Other: Sella is unremarkable.  Mastoid air cells are clear. IMPRESSION: Abnormal signal along the falx corresponding to subarachnoid hemorrhage on CT. Otherwise no significant change since prior study. Electronically Signed   By: Macy Mis M.D.   On: 08/26/2021 15:24   ECHOCARDIOGRAM COMPLETE  Result Date: 08/26/2021    ECHOCARDIOGRAM REPORT   Patient Name:   Kim [name] Date of Exam: 08/26/2021 Medical Rec #:  [id number]       Height:       61.0 in Accession #:    [id number]      Weight:       144.0 lb Date of Birth:  1955/08/14      BSA:          1.643 m Patient Age:    37 years        BP:           105/69 mmHg Patient Gender: F               HR:           91 bpm. Exam Location:  Inpatient Procedure: 2D Echo, Cardiac Doppler, Color Doppler and Intracardiac            Opacification Agent Indications:    Elevated troponin  History:        Patient has prior history of Echocardiogram examinations, most                 recent 12/19/2020. COPD.  Sonographer:  Joette Catching RCS Referring Phys: [id number] RONDELL A SMITH  Sonographer Comments: Technically difficult study due to poor echo windows. Image acquisition challenging due to COPD. IMPRESSIONS  1. Left ventricular ejection fraction, by estimation, is 35 to 40%. The left ventricle has moderately decreased function. The left ventricle demonstrates regional wall motion abnormalities (see scoring diagram/findings for description). There is hypokinesis of all mid LV segments although appears slightly worse in the mid septal LV segments. The apical LV segments appear hyperkinetic. 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 Clinical correlation recommended.  2. Left ventricular diastolic parameters are consistent with Grade I diastolic dysfunction (impaired relaxation).  3. Right ventricular systolic function is normal. The right ventricular size is normal.  4. The mitral valve is normal in structure. Trivial mitral valve regurgitation.  5. The aortic valve was not well visualized. Aortic valve regurgitation is mild. Aortic valve sclerosis/calcification is present, without any evidence of aortic stenosis.  6. The inferior vena cava is normal in size with greater than 50% respiratory variability, suggesting right atrial pressure of 3 mmHg. Comparison(s): Compared to prior TTE in 11/2020, there is now hyperkinesis of the LV apical segments (previously akinetic) and hypokinesis of the mid LV segments. EF now 35-40% (previously 30-35%). 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 FINDINGS  Left Ventricle: There is hypokinesis of all mid LV segments although appears slightly worse in the mid septal LV segments. The apical LV segments appear hyperkinetic. 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 Clinical correlation recommended. Left ventricular ejection fraction, by  estimation, is 35 to 40%. The left ventricle has moderately decreased function. The left ventricle demonstrates regional wall motion abnormalities. Definity  contrast agent was given IV to delineate the left ventricular endocardial borders. The left ventricular internal cavity size was normal in size. There is no left ventricular hypertrophy. Left ventricular diastolic parameters are consistent with Grade I diastolic dysfunction (impaired relaxation). Right Ventricle: The right ventricular size is normal. No increase in right ventricular wall thickness. Right ventricular systolic function is normal. Left Atrium: Left atrial size was normal in size. Right Atrium: Right atrial size was normal in size. Pericardium: There is no evidence of pericardial effusion. Mitral Valve: The mitral valve is normal in structure. Trivial mitral valve regurgitation. Tricuspid Valve: The tricuspid valve is normal in structure. Tricuspid valve regurgitation is not demonstrated. Aortic Valve: The aortic valve was not well visualized. Aortic valve regurgitation is mild. Aortic regurgitation PHT measures 275 msec. Aortic valve sclerosis/calcification is present, without any evidence of aortic stenosis. Aortic valve mean gradient measures 4.5 mmHg. Aortic valve peak gradient measures 8.7 mmHg. Aortic valve area, by VTI measures 1.99 cm. Pulmonic Valve: The pulmonic valve was not well visualized. Aorta: The aortic root and ascending aorta are structurally normal, with no evidence of dilitation. Venous: The inferior vena cava is normal in size with greater than 50% respiratory variability, suggesting right atrial pressure of 3 mmHg. IAS/Shunts: The atrial septum is grossly normal.  LEFT VENTRICLE PLAX 2D LVIDd:         4.80 cm      Diastology LVIDs:         3.70 cm      LV e' medial:    6.31 cm/s LV PW:         1.00 cm      LV E/e' medial:  11.4 LV IVS:  0.90 cm      LV e' lateral:   5.33 cm/s LVOT diam:     1.90 cm      LV E/e'  lateral: 13.5 LV SV:         52 LV SV Index:   32 LVOT Area:     2.84 cm  LV Volumes (MOD) LV vol d, MOD A2C: 120.0 ml LV vol d, MOD A4C: 114.0 ml LV vol s, MOD A2C: 83.0 ml LV vol s, MOD A4C: 66.6 ml LV SV MOD A2C:     37.0 ml LV SV MOD A4C:     114.0 ml LV SV MOD BP:      46.3 ml RIGHT VENTRICLE RV S prime:     12.80 cm/s TAPSE (M-mode): 2.2 cm LEFT ATRIUM             Index LA diam:        2.70 cm 1.64 cm/m LA Vol (A2C):   22.3 ml 13.58 ml/m LA Vol (A4C):   12.0 ml 7.31 ml/m LA Biplane Vol: 17.8 ml 10.84 ml/m  AORTIC VALVE                     PULMONIC VALVE AV Area (Vmax):    2.06 cm      PV Vmax:       1.26 m/s AV Area (Vmean):   2.06 cm      PV Peak grad:  6.4 mmHg AV Area (VTI):     1.99 cm AV Vmax:           147.50 cm/s AV Vmean:          103.800 cm/s AV VTI:            0.263 m AV Peak Grad:      8.7 mmHg AV Mean Grad:      4.5 mmHg LVOT Vmax:         107.00 cm/s LVOT Vmean:        75.400 cm/s LVOT VTI:          0.185 m LVOT/AV VTI ratio: 0.70 AI PHT:            275 msec  AORTA Ao Root diam: 2.80 cm Ao Asc diam:  2.60 cm MITRAL VALVE                TRICUSPID VALVE MV Area (PHT): 8.92 cm     TR Peak grad:   26.0 mmHg MV Decel Time: 85 msec      TR Vmax:        255.00 cm/s MV E velocity: 72.20 cm/s MV A velocity: 103.00 cm/s  SHUNTS MV E/A ratio:  0.70         Systemic VTI:  0.18 m                             Systemic Diam: 1.90 cm Gwyndolyn Kaufman MD Electronically signed by Gwyndolyn Kaufman MD Signature Date/Time: 08/26/2021/4:08:17 PM    Final      LOS: 2 days   Oren Binet, MD  [facility]    To contact the attending provider between 7A-7P or the covering provider during after hours 7P-7A, please log into the web site [email or web address] and access using universal [place] password for that web site. If you do not have the password, please call the hospital operator.  08/28/2021, 12:22 PM

## 2021-08-28 NOTE — Progress Notes (Signed)
Physical Therapy Treatment Patient Details Name: Kim [name] MRN: [id number] DOB: [birth year] Today's Date: 08/28/2021   History of Present Illness Pt is a 66 y.o. female admitted due to falling backwards and lost consciousness. Pt has reported a headache since the fall and change4s in memory.  CT showed trace subarachnoid hemorrhage. PMH includes COPD (2L O2 baseline), HTN, cardiomyopathy, osteoporosis, anxiety, depression.    PT Comments    Pt received in supine with blinds drawn, agreeable to therapy session after breathing treatment and premedication for anxiety/headache. Pt continuing to report headache throughout session (worse with standing) and increased full-body tremors initially upon standing but improved tolerance for standing activity after repeated sit<>stands and seated rest breaks. Pt MAP (87) with tremors standing to (93) sitting, then (85) standing at 1 and 3 minutes, TED hose donned throughout. Pt able to perform short gait trials x2 in room with increased time and perform step-ups on platform in room and standing exercises for BLE strengthening. Discussed disposition with pt, she remains agreeable to short term low to moderate intensity post-acute rehab. Pt continues to benefit from PT services to progress toward functional mobility goals.   Recommendations for follow up therapy are one component of a multi-disciplinary discharge planning process, led by the attending physician.  Recommendations may be updated based on patient status, additional functional criteria and insurance authorization.  Follow Up Recommendations  Skilled nursing-short term rehab (<3 hours/day)     Assistance Recommended at Discharge Frequent or constant Supervision/Assistance  Patient can return home with the following A little help with walking and/or transfers;A little help with bathing/dressing/bathroom;Assistance with cooking/housework;Direct supervision/assist for medications management;Direct  supervision/assist for financial management;Assist for transportation;Help with stairs or ramp for entrance   Equipment Recommendations  None recommended by PT    Recommendations for Other Services       Precautions / Restrictions Precautions Precautions: Fall;Other (comment) Precaution Comments: anxious, orthostatic sx Restrictions Weight Bearing Restrictions: No     Mobility  Bed Mobility Overal bed mobility: Needs Assistance Bed Mobility: Supine to Sit, Sit to Supine     Supine to sit: Min guard     General bed mobility comments: increased time to perform, use of bed rail; from flat bed    Transfers Overall transfer level: Needs assistance Equipment used: Rolling walker (2 wheels) Transfers: Sit to/from Stand Sit to Stand: Min guard           General transfer comment: from EOB<>RW x3 reps, and from chair<>RW x 5reps    Ambulation/Gait Ambulation/Gait assistance: Min assist Gait Distance (Feet): 20 Feet (79f at bedside, seated break, then 275fin room) Assistive device: Rolling walker (2 wheels) Gait Pattern/deviations: Step-through pattern, Decreased step length - right, Decreased step length - left Gait velocity: slowed   Pre-gait activities: standing hip flexion x20 reps (2 sets x10 reps) General Gait Details: minA initially due to increased tremors with dizziness reported, after few mins seated break pt more steady on second trial needing mostly min guard with RW support   Stairs Stairs: Yes Stairs assistance: Min assist, Mod assist Stair Management: Forwards, Step to pattern, Two rails Number of Stairs: 5 General stair comments: slow pace, mod cues for safety/sequencing; using 7" platform step in room due to pt anxiety and earlier sx orthostasis (although BP stable after 5 mins); x5 total reps with RW to simulate B rails      Balance Overall balance assessment: Needs assistance Sitting-balance support: Feet supported Sitting balance-Leahy Scale:  Fair  Standing balance support: Single extremity supported, Reliant on assistive device for balance Standing balance-Leahy Scale: Poor Standing balance comment: pt steadier with BUE support of RW, nneding min guard for static standing at most and at most minA for gait on flat surface with RW                            Cognition Arousal/Alertness: Awake/alert Behavior During Therapy: Anxious Overall Cognitive Status: Impaired/Different from baseline Area of Impairment: Awareness, Problem solving, Safety/judgement                         Safety/Judgement: Decreased awareness of safety   Problem Solving: Requires verbal cues, Requires tactile cues General Comments: had anxiety medication prior to treatment, but is still nervous about ambulation, pt with full-body tremors upon standing initially to RW, after seated break pt with improved standing tolerance and no longer demonstrates tremors        Exercises Other Exercises Other Exercises: standing BLE AROM: hip flexion x10 reps x2 sets, heel raises x10 reps Other Exercises: STS x 5 reps reciprocal from chair using arms    General Comments General comments (skin integrity, edema, etc.): BP 133/73 (87) standing at RW (full-body tremors), then BP 149/73 (93) sitting EOB, then BP 130/68 (85) standing at 1 min and BP 135/65 (85) standing at 3 mins. SpO2 WFL on 3L O2 [place] throughout mobility      Pertinent Vitals/Pain Pain Assessment Pain Assessment: 0-10 Pain Score: 7  Pain Location: head Pain Descriptors / Indicators: Discomfort, Headache Pain Intervention(s): Limited activity within patient's tolerance, Monitored during session, Premedicated before session, Repositioned     PT Goals (current goals can now be found in the care plan section) Acute Rehab PT Goals Patient Stated Goal: be able to get around without having headache PT Goal Formulation: With patient Time For Goal Achievement: 09/10/21 Progress  towards PT goals: Progressing toward goals    Frequency    Min 4X/week      PT Plan Current plan remains appropriate       AM-PAC PT "6 Clicks" Mobility   Outcome Measure  Help needed turning from your back to your side while in a flat bed without using bedrails?: A Little (dizzy with RN) Help needed moving from lying on your back to sitting on the side of a flat bed without using bedrails?: A Little Help needed moving to and from a bed to a chair (including a wheelchair)?: A Little Help needed standing up from a chair using your arms (e.g., wheelchair or bedside chair)?: A Little Help needed to walk in hospital room?: A Lot (mod cues, increased time) Help needed climbing 3-5 steps with a railing? : A Lot (mod cues, increased time) 6 Click Score: 16    End of Session Equipment Utilized During Treatment: Gait belt;Oxygen Activity Tolerance: Patient limited by pain (headache with ambulation) Patient left: with call bell/phone within reach;in chair;with chair alarm set Nurse Communication: Mobility status;Other (comment) (orthostatic sx but BP appearing stable when checked; pt reports she is still agreeable to SNF) PT Visit Diagnosis: Unsteadiness on feet (R26.81);Other abnormalities of gait and mobility (R26.89);Muscle weakness (generalized) (M62.81);Repeated falls (R29.6);History of falling (Z91.81);Difficulty in walking, not elsewhere classified (R26.2);Other symptoms and signs involving the nervous system (R29.898);Dizziness and giddiness (R42)     Time: 6222-9798 PT Time Calculation (min) (ACUTE ONLY): 46 min  Charges:  $Gait Training: 8-22 mins $Therapeutic Exercise:  8-22 mins $Therapeutic Activity: 8-22 mins                     Kim [name] P., PTA Acute Rehabilitation Services Secure Chat Preferred 9a-5:30pm Office: [place] 08/28/2021, 5:01 PM

## 2021-08-28 NOTE — Plan of Care (Signed)
  Problem: Education: Goal: Knowledge of disease or condition will improve Outcome: Progressing   Problem: Activity: Goal: Ability to tolerate increased activity will improve Outcome: Progressing   Problem: Activity: Goal: Will verbalize the importance of balancing activity with adequate rest periods Outcome: Progressing   Problem: Respiratory: Goal: Levels of oxygenation will improve Outcome: Progressing   Problem: Coping: Goal: Level of anxiety will decrease Outcome: Progressing

## 2021-08-28 NOTE — TOC Initial Note (Signed)
Transition of Care [facility]) - Initial/Assessment Note    Patient Details  Name: Kim [name] MRN: [id number] Date of Birth: September 20, 1955  Transition of [facility]) CM/SW Contact:    Kim Halsted, LCSW Phone Number: 08/28/2021, 11:27 AM  Clinical Narrative:                 CSW received consult for possible SNF placement at time of discharge. CSW spoke with patient and spouse at bedside. Patient reported that patient's spouse is currently unable to care for patient at their home given patient's current physical needs and fall risk. Patient expressed understanding of PT recommendation and is agreeable to SNF placement at time of discharge if needed but she asked if she could go to CIR. CSW sent request to CIR to screen. CSW discussed insurance authorization process and will provide Medicare SNF ratings list. Patient has received COVID vaccines. CSW will send out referrals for review and proceed with SNF vs. CIR.   Skilled Nursing Rehab Facilities-   [email or web address]   Ratings out of 5 possible   Name Address  Phone # [place] Inspection Overall  [facility] [address] [place] '4 5 2 3  '$ [facility]  [address], [place] [phone] '3 2 5 5  '$ [facility] [place], [place] '3 1 1 1  '$ [facility] [place], [place] '3 2 4 4  '$ [facility] [address], [place] '1 1 2 1  '$ [place]. [address] [place] '2 1 4 3  '$ [facility] [address] [place] '5 2 3 4  '$ [facility] [address] [place] '5 2 2 3  '$ [address] ([place]) [place], [place] [phone] '5 1 2 2  '$ [facility] [phone] [address], [place] [phone] '4 1 2 1  '$ [facility] [address], [facility] [phone] '4 1 2 1  '$ [facility] [place] ([place]) [address] [place], [place] [phone] '4 1 1 1  '$ [facility]in Flock [address] [place] [phone] '3 2 4 4          '$ [place], [place]      [facility] [place] '4 2 3 3  '$ [facility] [place] [address] [place] '4 1 5 4  '$ [facility], [place] [address], [place] [phone] '2 1 1 1  '$ [facility] [address], [place] [phone] '2 1 3 2          '$ [address] (no [facility]) [place] [address], [place] [phone] '4 5 5 5  '$ [facility] (No Humana) [address] [phone] [address], [place] '3 1 4 3  '$ [facility] (No UHC) [place], [place] [phone] '5 5 5 5  '$ [facility] [address] [phone] '3 2 4 4  '$ [facility] [place] [address], [place] '1 1 2 1  '$ [facility] [address], [address] [place] '2 1 1 1  '$ [place], [place] '5 2 4 5  '$ [facility] [address], [place] '3 1 1 1  '$ [facility] [place], [place] '2 1 2 1          '$ [facility] [address], [place] '1 1 1 1  '$ [place] [address], [place]  [phone] '2 4 2 2  '$ [facility] [place] [address], [place] [phone] '5 2 3 4  '$ [place] [address], [place] '2 1 1 1  '$ [place] (No Humana) [address] [place] [phone] '2 1 3 2  '$ [facility] [address], [place] [phone] '3 1 1 1          '$ [facility] [place], [place] '5 4 5 5  '$ [facility])  [address], [place] '2 2 3 3  '$ [facility]) [place] [address] [place] '3 2 4 4  '$ [facility] [address] [place] '4 3 4 4  '$ [address] [place], [place] '3 3  1 1  '$ [place] [facility]) [address] [place] C Jones [phone] '2 2 4 4     '$ Expected Discharge Plan: [place] Barriers to Discharge: Continued Medical Work up, SNF Pending bed offer   Patient Goals and CMS Choice Patient states their goals for this hospitalization and ongoing recovery are:: Rehab CMS Medicare.gov Compare Post Acute Care list provided to:: Patient Choice offered to / list presented to : Patient, Spouse  Expected Discharge Plan and Services Expected Discharge Plan: [place] In-house Referral: Clinical Social Work   Post Acute Care Choice: [place] Living arrangements for the past 2 months: [place]                                      Prior Living Arrangements/Services Living arrangements for the past 2 months: Single Family Home Lives with:: Spouse Patient language and need for interpreter reviewed:: Yes Do you feel safe going back to the place where you live?: Yes      Need for Family Participation in Patient Care: Yes (Comment) Care giver support system in place?: Yes (comment)   Criminal Activity/Legal Involvement Pertinent to Current Situation/Hospitalization: No - Comment as needed  Activities of Daily Living Home Assistive Devices/Equipment: Eyeglasses, Grab bars in shower, Oxygen ADL Screening (condition at time of admission) Patient's cognitive ability adequate to safely complete daily activities?: Yes Is the patient deaf or have difficulty hearing?: No Does the patient have difficulty seeing, even when wearing glasses/contacts?: Yes Does the patient have difficulty concentrating, remembering, or making decisions?: Yes Patient able to express need for assistance with ADLs?: Yes Does the patient have difficulty dressing or bathing?: Yes Independently performs ADLs?: No Communication: Independent Dressing (OT): Independent Grooming: Independent Feeding:  Independent Bathing: Needs assistance Is this a change from baseline?: Pre-admission baseline Toileting: Independent In/Out Bed: Independent Walks in Home: Needs assistance Is this a change from baseline?: Pre-admission baseline Does the patient have difficulty walking or climbing stairs?: No Weakness of Legs: None Weakness of Arms/Hands: None  Permission Sought/Granted Permission sought to share information with : Facility Sport and exercise psychologist, Family Supports Permission granted to share information with : Yes, Verbal Permission Granted  Share Information with NAME: Kim [name]  Permission granted to share info w AGENCY: SNFs  Permission granted to share info w Relationship: Spouse  Permission granted to share info w Contact Information: [phone]  Emotional Assessment Appearance:: Appears stated age Attitude/Demeanor/Rapport: Engaged, Gracious Affect (typically observed): Accepting, Appropriate, Pleasant Orientation: : Oriented to Self, Oriented to Place, Oriented to  Time, Oriented to Situation Alcohol / Substance Use: Not Applicable  Psych Involvement: No (comment)  Admission diagnosis:  SAH (subarachnoid hemorrhage) ([place]) [I60.9] Lung nodule [R91.1] COPD exacerbation ([place]) [J44.1] Acute respiratory failure with hypoxia ([place]) [J96.01] COPD with acute exacerbation ([place]) [J44.1] Patient Active Problem List   Diagnosis Date Noted   COPD with acute exacerbation ([place]) 08/26/2021   Double vision 08/26/2021   SIRS (systemic inflammatory response syndrome) ([place]) 08/26/2021   AKI (acute kidney injury) ([place]) 08/26/2021   Postconcussive syndrome 08/26/2021   Elevated troponin 08/26/2021   Subarachnoid hemorrhage ([place]) 08/26/2021   Syncope and collapse 08/26/2021   Heart failure with preserved ejection fraction ([place]) 08/26/2021   Acute on chronic respiratory failure with hypoxia and hypercapnia ([place]) 06/26/2021   NSTEMI (non-ST elevated myocardial infarction) ([place]) 11/17/2020    Osteoporosis 06/05/2020   Chronic diastolic CHF (congestive heart failure) ([place]) 03/24/2020   Essential hypertension 03/24/2020   Vertebral fracture, osteoporotic ([place]) 03/05/2020   History of colonic polyps    Benign neoplasm of ascending colon    Neuritis 01/29/2017   Alpha-1-antitrypsin deficiency carrier 01/19/2017   Dependent edema 01/14/2017   Orthostatic hypotension    Allergic rhinitis 08/10/2015   Acute on chronic respiratory failure with hypoxia ([place]) 07/05/2015   Hyperglycemia    HLD (hyperlipidemia)    Coronary artery calcification seen on CAT scan 07/02/2015   Solitary pulmonary nodule 12/02/2013   Loss of weight 11/10/2013   Osteoarthritis 06/05/2009   Brachial neuritis or radiculitis 06/05/2009   Dyslipidemia 03/12/2009   NEPHROLITHIASIS 01/05/2008   COPD GOLD III  08/31/2007   PARESTHESIA 08/24/2007   Anxiety and depression 11/19/2006   PCP:  [name], NP Pharmacy:   [facility], [place] [address] Phone: [phone] Fax: [place], [place] - [address] AT [facility] [address] [place] [address] Phone: [phone] Fax: [phone]  [facility] [place], [place] [address] Phone: [phone] Fax: [phone]  [facility] [place], [address] AT [place] [address] Phone: [phone] Fax: [phone]     Social Determinants of Health (SDOH) Interventions    Readmission Risk Interventions     View : No data to display.

## 2021-08-28 NOTE — Care Management Important Message (Signed)
Important Message  Patient Details  Name: Kim [name] MRN: [id number] Date of Birth: [birth year]   Medicare Important Message Given:  Yes     Orbie Pyo 08/28/2021, 2:54 PM

## 2021-08-28 NOTE — Progress Notes (Signed)
Pt continues to have episodes of dizziness and increased headache when she stands.   At this time assisted pt to stand up from recliner momentarily and pt immediately c/o of extreme dizziness, and increase in headache pain holding onto the nurse and keeping her eyes closed.   Assisted back down on to the chair, after few minutes, dizziness subsided, headache eased back down some.    Pt resting at this time in the chair with feet elevated, plans to try to eat dinner. Pt instructed to call if any changes.

## 2021-08-29 ENCOUNTER — Inpatient Hospital Stay (INDEPENDENT_AMBULATORY_CARE_PROVIDER_SITE_OTHER): Payer: Medicare Other

## 2021-08-29 DIAGNOSIS — J441 Chronic obstructive pulmonary disease with (acute) exacerbation: Secondary | ICD-10-CM | POA: Diagnosis not present

## 2021-08-29 DIAGNOSIS — M8000XD Age-related osteoporosis with current pathological fracture, unspecified site, subsequent encounter for fracture with routine healing: Secondary | ICD-10-CM | POA: Diagnosis not present

## 2021-08-29 DIAGNOSIS — F419 Anxiety disorder, unspecified: Secondary | ICD-10-CM | POA: Diagnosis not present

## 2021-08-29 DIAGNOSIS — J9621 Acute and chronic respiratory failure with hypoxia: Secondary | ICD-10-CM | POA: Diagnosis not present

## 2021-08-29 DIAGNOSIS — R55 Syncope and collapse: Secondary | ICD-10-CM

## 2021-08-29 MED ORDER — LIDOCAINE 5 % EX PTCH: 1.0000 | MEDICATED_PATCH | CUTANEOUS | 0 refills | Status: DC

## 2021-08-29 MED ORDER — BUTALBITAL-APAP-CAFFEINE 50-325-40 MG PO TABS: 1.0000 | ORAL_TABLET | Freq: Four times a day (QID) | ORAL | 0 refills | Status: DC | PRN

## 2021-08-29 MED ORDER — HYDROCODONE-ACETAMINOPHEN 7.5-325 MG PO TABS
1.0000 | ORAL_TABLET | Freq: Four times a day (QID) | ORAL | 0 refills | Status: DC | PRN
Start: 2021-08-29 — End: 2021-10-18

## 2021-08-29 MED ORDER — PREDNISONE 5 MG PO TABS: ORAL_TABLET | ORAL | 1 refills | Status: DC

## 2021-08-29 MED ORDER — PREDNISONE 10 MG PO TABS: ORAL_TABLET | ORAL | 0 refills | Status: DC

## 2021-08-29 MED ORDER — BUDESONIDE 0.25 MG/2ML IN SUSP
RESPIRATORY_TRACT | Status: AC
  Administered 2021-08-29: 0.25 mg
  Filled 2021-08-29: qty 2

## 2021-08-29 MED ORDER — ALPRAZOLAM 0.5 MG PO TABS: 0.5000 mg | ORAL_TABLET | Freq: Three times a day (TID) | ORAL | 0 refills | Status: DC | PRN

## 2021-08-29 NOTE — Progress Notes (Addendum)
Physical Therapy Treatment Patient Details Name: Kim [name] MRN: [id number] DOB: [birth year] Today's Date: 08/29/2021   History of Present Illness Pt is a 66 y.o. female admitted due to falling backwards and lost consciousness. Pt has reported a headache since the fall and change4s in memory.  CT showed trace subarachnoid hemorrhage. PMH includes COPD (2L O2 baseline), HTN, cardiomyopathy, osteoporosis, anxiety, depression.    PT Comments    Pt admitted with above diagnosis. Pt was able to tolerate Epley for left posterior canal BPPV. Pt appeared to move better after this maneuver than in previous sessions. Pt states that the dizziness is less. 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  Will continue to assess pt.  Pt was able to ambulate today with RW and states it was a little easier to get around.  Pt currently with functional limitations due to balance and endurance deficits. Pt will benefit from skilled PT to increase their independence and safety with mobility to allow discharge to the venue listed below.      Recommendations for follow up therapy are one component of a multi-disciplinary discharge planning process, led by the attending physician.  Recommendations may be updated based on patient status, additional functional criteria and insurance authorization.  Follow Up Recommendations  Skilled nursing-short term rehab (<3 hours/day) - vestibular rehab     Assistance Recommended at Discharge Frequent or constant Supervision/Assistance  Patient can return home with the following A little help with walking and/or transfers;A little help with bathing/dressing/bathroom;Assistance with cooking/housework;Direct supervision/assist for medications management;Direct supervision/assist for financial management;Assist for transportation;Help with stairs or ramp for entrance   Equipment Recommendations  None recommended by PT    Recommendations for  Other Services       Precautions / Restrictions Precautions Precautions: Fall;Other (comment) Precaution Comments: anxious, orthostatic sx Restrictions Weight Bearing Restrictions: No     Mobility  Bed Mobility Overal bed mobility: Needs Assistance Bed Mobility: Supine to Sit, Sit to Supine     Supine to sit: Min guard Sit to supine: Min guard   General bed mobility comments: Assessed for BPPV prior to gettting pt OOB.  Pt with positive test for left posterior canal BPPV.  Treated with Epley maneuver.  Pt needed increased time to perform, use of bed rail; from flat bed    Transfers Overall transfer level: Needs assistance Equipment used: Rolling walker (2 wheels) Transfers: Sit to/from Stand Sit to Stand: Min guard           General transfer comment: Needed cues for hand placement.  Pt reports less dizziness than yesterday upon standing.  HA did incr when pt sat up.    Ambulation/Gait Ambulation/Gait assistance: Min assist Gait Distance (Feet): 25 Feet Assistive device: Rolling walker (2 wheels) Gait Pattern/deviations: Step-through pattern, Decreased step length - right, Decreased step length - left, Drifts right/left Gait velocity: slowed Gait velocity interpretation: <1.31 ft/sec, indicative of household ambulator   General Gait Details: Pt needed min guard with RW support to ambulate around bed with 4LO2 in place.  No LOB but did need cues to stay close to RW and cues for sequencing steps and rW.  Pt reports overall that she feels less dizzy today than in previous visits.   Stairs             Wheelchair Mobility    Modified Rankin (Stroke Patients Only) Modified Rankin (Stroke Patients Only) Pre-Morbid Rankin Score: Moderate disability Modified Rankin: Moderately severe disability  Balance Overall balance assessment: Needs assistance Sitting-balance support: Feet supported Sitting balance-Leahy Scale: Fair     Standing balance support:  Single extremity supported, Reliant on assistive device for balance Standing balance-Leahy Scale: Poor Standing balance comment: pt steadier with BUE support of RW, nneding min guard for static standing at most and at most minA for gait on flat surface with RW                            Cognition Arousal/Alertness: Awake/alert Behavior During Therapy: Anxious Overall Cognitive Status: Impaired/Different from baseline Area of Impairment: Awareness, Problem solving, Safety/judgement                         Safety/Judgement: Decreased awareness of safety   Problem Solving: Requires verbal cues, Requires tactile cues          Exercises      General Comments General comments (skin integrity, edema, etc.): 95% on 4L, Sitting BP 125/69; BP after walk 124/72      Pertinent Vitals/Pain Pain Assessment Pain Assessment: 0-10 Pain Score: 6  Pain Location: head Pain Descriptors / Indicators: Discomfort, Headache Pain Intervention(s): Limited activity within patient's tolerance, Monitored during session, Repositioned, RN gave pain meds during session    Home Living                          Prior Function            PT Goals (current goals can now be found in the care plan section) Acute Rehab PT Goals Patient Stated Goal: be able to get around without having headache Progress towards PT goals: Progressing toward goals    Frequency    Min 4X/week      PT Plan Current plan remains appropriate    Co-evaluation              AM-PAC PT "6 Clicks" Mobility   Outcome Measure  Help needed turning from your back to your side while in a flat bed without using bedrails?: A Little Help needed moving from lying on your back to sitting on the side of a flat bed without using bedrails?: A Little Help needed moving to and from a bed to a chair (including a wheelchair)?: A Little Help needed standing up from a chair using your arms (e.g.,  wheelchair or bedside chair)?: A Little Help needed to walk in hospital room?: A Lot Help needed climbing 3-5 steps with a railing? : A Lot 6 Click Score: 16    End of Session Equipment Utilized During Treatment: Gait belt;Oxygen Activity Tolerance: Patient limited by pain;Patient limited by fatigue (headache with ambulation) Patient left: with call bell/phone within reach;in bed;with bed alarm set;with family/visitor present Nurse Communication: Mobility status PT Visit Diagnosis: Unsteadiness on feet (R26.81);Other abnormalities of gait and mobility (R26.89);Muscle weakness (generalized) (M62.81);Repeated falls (R29.6);History of falling (Z91.81);Difficulty in walking, not elsewhere classified (R26.2);Other symptoms and signs involving the nervous system (R29.898);Dizziness and giddiness (R42)     Time: 4270-6237 PT Time Calculation (min) (ACUTE ONLY): 31 min  Charges:  $Gait Training: 8-22 mins $[place] Rep Proc: 8-22 mins                     Meriem Lemieux M,PT Acute Rehab Services [phone] (pager)    Alvira Philips 08/29/2021, 11:54 AM

## 2021-08-29 NOTE — Discharge Summary (Signed)
PATIENT DETAILS Name: Kim [name] Age: 66 y.o. Sex: female Date of Birth: [birth year] MRN: [id number]. Admitting Physician: Vernelle Emerald, MD WEX:[id number], Kim Rumps, NP  Admit Date: 08/26/2021 Discharge date: 08/29/2021  Recommendations for Outpatient Follow-up:  Follow up with PCP in 1-2 weeks Please obtain CMP/CBC in one week Please ensure follow-up with cardiology   Admitted From:  Home  Disposition: Skilled nursing facility   Discharge Condition: fair  CODE STATUS:   Code Status: Full Code   Diet recommendation:  Diet Order             Diet - low sodium heart healthy           Diet Heart Room service appropriate? Yes; Fluid consistency: Thin  Diet effective now                    Brief Summary: Patient is a 66 y.o.  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     Significant events: 5/22>> admit to [facility]-subdural hematoma-COPD exacerbation   Significant studies: 5/22>> CT head: Trace SDH 5/22>> CT C-spine: No acute traumatic injury identified 5/22>> MRI brain: Abnormal signal along the falx-corresponding to [facility]. 5/22>> 2 x 1.1 cm irregular opacity in the right upper mid lobe mid perihilar area 5/22>> Echo: EF 35-40% 5/24>> CT chest: No suspicious or indeterminate focal pulmonary nodule seen.   Significant microbiology data:     Procedures:     Consults: Cardiology, neurosurgery  Brief Hospital Course: Acute on chronic hypoxic/hypercapnic respiratory failure due to COPD exacerbation: Significantly better-back on her usual regimen of 2 L of oxygen-treated with bronchodilators, IV steroids and empiric antibiotics.  On discharge-she will continue on bronchodilators and tapering steroids.      Traumatic SAH/SDH: Evaluated by neurosurgery-nonsurgical management recommended.   Syncope: Telemetry negative-echo with EF 35-40%.  No further work-up recommended by cardiology.   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  Outpatient repeat echocardiogram recommended by cardiology.  No evidence of CAD by cath August 2022. Volume status is stable.  Diplopia: Present prior to this hospitalization-she is being followed by ophthalmology in the outpatient setting.  HTN: BP relatively stable-continue losartan, metoprolol-follow and optimize.   Anxiety/depression: Stable-continue as needed Xanax and paroxetine.   2 x 1.1 cm opacity in right upper middle lobe: CT chest without any concerning findings.   T6/T7 compression fractures: No severe back pain-likely chronic issues-incidental finding during this hospitalization.  Osteoporosis work-up deferred to the outpatient setting.    BMI: Estimated body mass index is 27.21 kg/m as calculated from the following:   Height as of this encounter: '5\' 1"'$  (1.549 m).   Weight as of this encounter: 65.3 kg.     Discharge Diagnoses:  Active Problems:   Acute on chronic respiratory failure with hypoxia (HCC)   Acute on chronic respiratory failure with hypoxia and hypercapnia (HCC)   COPD with acute exacerbation (HCC)   Orthostatic hypotension   Subarachnoid hemorrhage (HCC)   Syncope and collapse   SIRS (systemic inflammatory response syndrome) (HCC)   Elevated troponin   Postconcussive syndrome   Double vision   AKI (acute kidney injury) ([place])   Anxiety and depression   Osteoporosis   Heart failure with preserved ejection fraction [facility])   Discharge Instructions:  Activity:  As tolerated with  Full fall precautions use walker/cane & assistance as needed  Discharge Instructions     Call MD for:  difficulty breathing, headache or visual disturbances   Complete by: As directed    Diet - low sodium heart healthy   Complete  by: As directed    Discharge instructions   Complete by: As directed    Follow with Primary MD  [name], NP in 1-2 weeks  Please get a complete blood count and chemistry panel checked by your Primary MD at your next visit, and again as instructed by your Primary MD.  Get Medicines reviewed and adjusted: Please take all your medications with you for your next visit with your Primary MD  Laboratory/radiological data: Please request your Primary MD to go over all hospital tests and procedure/radiological results at the follow up, please ask your Primary MD to get all Hospital records sent to his/her office.  In some cases, they will be blood work, cultures and biopsy results pending at the time of your discharge. Please request that your primary care M.D. follows up on these results.  Also Note the following: If you experience worsening of your admission symptoms, develop shortness of breath, life threatening emergency, suicidal or homicidal thoughts you must seek medical attention immediately by calling 911 or calling your MD immediately  if symptoms less severe.  You must read complete instructions/literature along with all the possible adverse reactions/side effects for all the Medicines you take and that have been prescribed to you. Take any new Medicines after you have completely understood and accpet all the possible adverse reactions/side effects.   Do not drive when taking Pain medications or sleeping medications (Benzodaizepines)  Do not take more than prescribed Pain, Sleep and Anxiety Medications. It is not advisable to combine anxiety,sleep and pain medications without talking with your primary care practitioner  Special Instructions: If you have smoked or chewed Tobacco  in the last 2 yrs please stop smoking, stop any regular Alcohol  and or any Recreational drug use.  Wear Seat belts while driving.  Please note: You were cared for by a hospitalist during your hospital  stay. Once you are discharged, your primary care physician will handle any further medical issues. Please note that NO REFILLS for any discharge medications will be authorized once you are discharged, as it is imperative that you return to your primary care physician (or establish a relationship with a primary care physician if you do not have one) for your post hospital discharge needs so that they can reassess your need for medications and monitor your lab values.   Increase activity slowly   Complete by: As directed       Allergies as of 08/29/2021       Reactions   Tape Other (See Comments)   Skin tears easily, peels skin off   Daliresp [roflumilast]    Skin peeling   Latex Other (See Comments)   Peels skin off   Lisinopril Cough   Morphine And Related    hallucinations   Penicillins Hives, Other (See Comments)   Tolerates Omnicef Has patient had a PCN reaction causing immediate rash, facial/tongue/throat swelling, SOB or lightheadedness with hypotension: Yes Has patient had a PCN reaction causing severe rash involving mucus membranes or skin necrosis: No Has patient had a PCN reaction that required hospitalization No Has patient had a PCN reaction occurring within the last 10 years: No If all of the above answers are "NO", then may proceed with Cephalosporin  use.   Omnicef [cefdinir] Rash        Medication List     STOP taking these medications    rosuvastatin 20 MG tablet Commonly known as: CRESTOR       TAKE these medications    acetaminophen 500 MG tablet Commonly known as: TYLENOL Take 500 mg by mouth 2 (two) times daily as needed for headache.   albuterol 108 (90 Base) MCG/ACT inhaler Commonly known as: VENTOLIN HFA INHALE TWO PUFFS INTO LUNGS EVERY 4 TO 6 HOURS AS NEEDED FOR SHORTNESS OF BREATH/WHEEZING. What changed:  how much to take how to take this when to take this additional instructions   ALPRAZolam 0.5 MG tablet Commonly known as: XANAX Take  1 tablet (0.5 mg total) by mouth 3 (three) times daily as needed for anxiety.   aspirin EC 81 MG tablet Take 1 tablet (81 mg total) by mouth daily.   butalbital-acetaminophen-caffeine 50-325-40 MG tablet Commonly known as: FIORICET Take 1 tablet by mouth every 6 (six) hours as needed for headache.   CALCIUM 600 + D PO Take 600 mg by mouth daily. Calcium 600 mg + 4000 unit D3   cetirizine 10 MG tablet Commonly known as: ZYRTEC Take 10 mg by mouth daily.   furosemide 40 MG tablet Commonly known as: LASIX Take 1 tablet (40 mg total) by mouth daily as needed (for an overnight weight gain of 3-5 pounds of fluid).   Ginkgo Biloba 120 MG Caps Take 120 mg by mouth daily.   HYDROcodone-acetaminophen 7.5-325 MG tablet Commonly known as: Norco Take 1 tablet by mouth every 6 (six) hours as needed for moderate pain. What changed:  when to take this reasons to take this additional instructions   ipratropium-albuterol 0.5-2.5 (3) MG/3ML Soln Commonly known as: DUONEB USE 3 ML VIA NEBULIZER EVERY 6 HOURS AS NEEDED.  DX: J44.9 What changed:  how much to take how to take this additional instructions   lidocaine 5 % Commonly known as: LIDODERM Place 1 patch onto the skin daily. Remove & Discard patch within 12 hours or as directed by MD   losartan 25 MG tablet Commonly known as: COZAAR Take 1 tablet (25 mg total) by mouth daily.   magnesium oxide 400 MG tablet Commonly known as: MAG-OX Take 400 mg by mouth daily.   metoprolol succinate 25 MG 24 hr tablet Commonly known as: TOPROL-XL Take 0.5 tablets (12.5 mg total) by mouth daily.   Mucinex Maximum Strength 1200 MG Tb12 Generic drug: Guaifenesin Take 1,200 mg by mouth every 12 (twelve) hours.   nitroGLYCERIN 0.4 MG SL tablet Commonly known as: NITROSTAT Place 1 tablet (0.4 mg total) under the tongue every 5 (five) minutes as needed for chest pain.   nystatin 100000 UNIT/ML suspension Commonly known as: MYCOSTATIN Take 5  mLs by mouth 3 (three) times daily as needed (thrush).   OXYGEN Inhale 2 L/min into the lungs continuous.   PARoxetine 30 MG tablet Commonly known as: PAXIL Take 1 tablet (30 mg total) by mouth daily.   PATADAY OP Place 1 drop into the left eye daily.   Potassium 99 MG Tabs Take 99 mg by mouth daily.   predniSONE 5 MG tablet Commonly known as: DELTASONE Take 40 mg p.o. daily for 2 days, then 30 mg p.o. daily for 2 days, then 20 mg p.o. daily for 2 days, then 10 mg p.o. daily for 2 days and then resume your usual dosing of 5 mg daily. What changed:  how  much to take how to take this when to take this additional instructions   Spacer/Aero Chamber Mouthpiece Misc 1 Device by Does not apply route as directed.   Spiriva Respimat 2.5 MCG/ACT Aers Generic drug: Tiotropium Bromide Monohydrate Inhale 2 puffs into the lungs daily. What changed:  when to take this reasons to take this   Symbicort 160-4.5 MCG/ACT inhaler Generic drug: budesonide-formoterol INHALE 2 PUFFS TWICE DAILY What changed: how to take this   VITAMIN D3 PO Take 5,000 Units by mouth daily.        Follow-up Information     [name], NP. Schedule an appointment as soon as possible for a visit in 1 week(s).   Specialty: Family Medicine Contact information: [place] [address] [phone]         [name], MD Follow up in 2 week(s).   Specialty: Cardiology Contact information: [address] [place] [address] [phone]                Allergies  Allergen Reactions   Tape Other (See Comments)    Skin tears easily, peels skin off   Daliresp [Roflumilast]     Skin peeling   Latex Other (See Comments)    Peels skin off   Lisinopril Cough   Morphine And Related     hallucinations   Penicillins Hives and Other (See Comments)    Tolerates Omnicef Has patient had a PCN reaction causing immediate rash,  facial/tongue/throat swelling, SOB or lightheadedness with hypotension: Yes Has patient had a PCN reaction causing severe rash involving mucus membranes or skin necrosis: No Has patient had a PCN reaction that required hospitalization No Has patient had a PCN reaction occurring within the last 10 years: No If all of the above answers are "NO", then may proceed with Cephalosporin use.    Omnicef [Cefdinir] Rash     Other Procedures/Studies: DG Thoracic Spine 2 View  Result Date: 08/26/2021 CLINICAL DATA:  Pain. EXAM: THORACIC SPINE 2 VIEWS COMPARISON:  None Available. FINDINGS: Two-view exam is limited by osteopenia. Loss of vertebral body height noted at 2 levels in the midthoracic spine likely T6 and T7, proximally 25% loss of height anteriorly at both levels. Features are age indeterminate by x-ray. No abnormal paraspinal line on the frontal projection. Possible sclerosis involving the right pedicle of the T7 vertebral body on the frontal projection. IMPRESSION: Loss of vertebral body height at 2 levels in the midthoracic spine, likely T6 and T7, age indeterminate by x-ray. Possible sclerosis of the right T7 pedicle. MRI thoracic spine could be used to further evaluate as clinically warranted. Electronically Signed   By: Misty Stanley M.D.   On: 08/26/2021 05:59   DG Lumbar Spine Complete  Result Date: 08/26/2021 CLINICAL DATA:  Pain. EXAM: LUMBAR SPINE - COMPLETE 4+ VIEW COMPARISON:  None Available. FINDINGS: Bones are demineralized. No evidence for lumbar spine fracture or subluxation. Mild loss of disc height noted at L3-4, L4-5 common L5-S1. Mild bilateral facet degeneration noted in the lower lumbar spine. Atherosclerotic calcification characterizes the abdominal aorta. IMPRESSION: Degenerative changes in the lower lumbar spine without acute bony abnormality. Aortic Atherosclerois (ICD10-170.0) Electronically Signed   By: Misty Stanley M.D.   On: 08/26/2021 06:01   CT Head Wo  Contrast  Addendum Date: 08/26/2021   ADDENDUM REPORT: 08/26/2021 05:21 ADDENDUM: Study discussed by telephone with Dr. Ripley Fraise on 08/26/2021 at 0518 hours. Electronically Signed   By: Lemmie Evens  [place] Crane M.D.   On: 08/26/2021 05:21   Result Date: 08/26/2021 CLINICAL DATA:  66 year old female with dizziness, hypertensive (systolic 086V). Fall 3 days ago. Diaphoretic. EXAM: CT HEAD WITHOUT CONTRAST TECHNIQUE: Contiguous axial images were obtained from the base of the skull through the vertex without intravenous contrast. RADIATION DOSE REDUCTION: This exam was performed according to the departmental dose-optimization program which includes automated exposure control, adjustment of the mA and/or kV according to patient size and/or use of iterative reconstruction technique. COMPARISON:  Brain MRI 05/12/2021.  Paranasal sinus CT 10/28/2016. FINDINGS: Brain: Small volume hyperdense blood along the interhemispheric fissure, left greater than right (series 3, images 24 and 27), most likely in the subarachnoid space. No dural thickening or nodularity of the falx on February MRI. No IVH or ventriculomegaly. Basilar cisterns appear to remain normal. Fetal type PCA origins again noted. No blood along the tentorium or elsewhere. No superimposed midline shift, mass effect, evidence of mass lesion, or evidence of cortically based acute infarction. Gray-white matter differentiation is within normal limits throughout the brain. Vascular: Calcified atherosclerosis at the skull base. No suspicious intracranial vascular hyperdensity. Skull: No fracture identified. Sinuses/Orbits: Visualized paranasal sinuses and mastoids are clear. Other: No acute orbit or scalp soft tissue injury identified. IMPRESSION: 1. Positive for trace Subarachnoid Hemorrhage along the interhemispheric fissure. Favor sequelae of trauma given pattern of blood and recent fall. 2. No skull fracture or other acute traumatic injury to the brain identified. 3.  Otherwise negative for age non contrast CT appearance of the brain. Electronically Signed: By: Genevie Ann M.D. On: 08/26/2021 05:15   CT CHEST WO CONTRAST  Result Date: 08/29/2021 CLINICAL DATA:  Abnormal xray - lung nodule, >= 1 cm Right upper lobe opacity on CXR EXAM: CT CHEST WITHOUT CONTRAST TECHNIQUE: Multidetector CT imaging of the chest was performed following the standard protocol without IV contrast. RADIATION DOSE REDUCTION: This exam was performed according to the departmental dose-optimization program which includes automated exposure control, adjustment of the mA and/or kV according to patient size and/or use of iterative reconstruction technique. COMPARISON:  05/08/2021, 01/31/2020 FINDINGS: Cardiovascular: Moderate coronary artery calcification. Global cardiac size within normal limits. No pericardial effusion. Central pulmonary arteries are of normal caliber. Mild atherosclerotic calcification within the thoracic aorta. No aortic aneurysm. Mediastinum/Nodes: No enlarged mediastinal or axillary lymph nodes. Thyroid gland, trachea, and esophagus demonstrate no significant findings. Lungs/Pleura: Severe emphysema. No suspicious or indeterminate focal pulmonary nodules. Chronic volume loss within the right middle lobe. No confluent pulmonary infiltrates. No pneumothorax or pleural effusion. No central obstructing lesion. Upper Abdomen: Status post cholecystectomy.  No acute abnormality. Musculoskeletal: No acute bone abnormality. No lytic or blastic bone lesion. IMPRESSION: No suspicious or indeterminate focal pulmonary nodules. No acute intrathoracic pathology identified. Severe emphysema. Moderate coronary artery calcification. Aortic Atherosclerosis (ICD10-I70.0) and Emphysema (ICD10-J43.9). Electronically Signed   By: Fidela Salisbury M.D.   On: 08/29/2021 00:23   CT Cervical Spine Wo Contrast  Result Date: 08/26/2021 CLINICAL DATA:  66 year old female with dizziness, hypertensive (systolic 784O).  Fall 3 days ago. Diaphoretic. EXAM: CT CERVICAL SPINE WITHOUT CONTRAST TECHNIQUE: Multidetector CT imaging of the cervical spine was performed without intravenous contrast. Multiplanar CT image reconstructions were also generated. RADIATION DOSE REDUCTION: This exam was performed according to the departmental dose-optimization program which includes automated exposure control, adjustment of the mA and/or kV according to patient size and/or use of iterative reconstruction technique. COMPARISON:  Head CT today reported separately. FINDINGS: Alignment: Straightening and mild reversal of cervical  lordosis. Cervicothoracic junction alignment is within normal limits. Bilateral posterior element alignment is within normal limits. Skull base and vertebrae: Visualized skull base is intact. No atlanto-occipital dissociation. C1 and C2 appear intact and aligned. Intermittent mild motion artifact. No acute osseous abnormality identified. Soft tissues and spinal canal: No prevertebral fluid or swelling. No visible canal hematoma. Partially retropharyngeal course of both carotids in the neck (normal variant). Otherwise negative visible noncontrast neck soft tissues. Disc levels: Chronic cervical spine disc and endplate degeneration. No convincing cervical spinal stenosis. Upper chest: Osteopenia. Grossly intact visible upper thoracic levels. Emphysema. Retained secretions in the trachea at the thoracic inlet. IMPRESSION: 1. No acute traumatic injury identified in the cervical spine. 2. Emphysema (ICD10-J43.9). Retained secretions in the trachea at the thoracic inlet, Aspiration not excluded. Electronically Signed   By: Genevie Ann M.D.   On: 08/26/2021 05:19   MR BRAIN WO CONTRAST  Result Date: 08/26/2021 CLINICAL DATA:  Diplopia EXAM: MRI HEAD WITHOUT CONTRAST TECHNIQUE: Multiplanar, multiecho pulse sequences of the brain and surrounding structures were obtained without intravenous contrast. COMPARISON:  CT 08/26/2021, MRI  05/12/2021 FINDINGS: Brain: There is abnormal diffusion hyperintensity with corresponding susceptibility the parasagittal sulci along the falx reflecting subarachnoid hemorrhage seen on the prior CT. No acute infarction. Ventricles and sulci are stable in size and configuration. Patchy foci of T2 hyperintensity in the supratentorial white matter are nonspecific but may reflect stable chronic microvascular ischemic changes. No mass or mass effect. There is no extra-axial collection. Vascular: Major vessel flow voids at the skull base are preserved. Skull and upper cervical spine: Normal marrow signal is preserved. Sinuses/Orbits: Paranasal sinuses are aerated. Bilateral lens replacements. Other: Sella is unremarkable.  Mastoid air cells are clear. IMPRESSION: Abnormal signal along the falx corresponding to subarachnoid hemorrhage on CT. Otherwise no significant change since prior study. Electronically Signed   By: Macy Mis M.D.   On: 08/26/2021 15:24   DG Chest Port 1 View  Result Date: 08/26/2021 CLINICAL DATA:  Shortness of breath and chest pain. EXAM: PORTABLE CHEST 1 VIEW COMPARISON:  Portable chest 06/29/2021. FINDINGS: Heart size and vascular pattern are normal. There is aortic atherosclerosis in the transverse segment, unremarkable mediastinal configuration. No vascular congestion is seen. The lungs emphysematous. There is an irregular 2 x 1.1 cm opacity developed in the right upper lobe mid perihilar area. The remaining lungs clear. No pleural collection is seen. There is thoracic spondylosis. IMPRESSION: 2 x 1.1 cm irregular opacity developed, right upper lobe mid perihilar area. The last chest CT was 05/08/2021 and demonstrated perifissural scar-like thickening in the right upper lung field extending along the major fissure. I suspect this is probably being seen in summation given the apical lordotic positioning on this study. A small infiltrate or less likely mass are both possible in a high-risk  patient. Chest CT could rule out mass if needed or a short interval follow-up study could be considered to see if this persists. Electronically Signed   By: Telford Nab M.D.   On: 08/26/2021 06:10   ECHOCARDIOGRAM COMPLETE  Result Date: 08/26/2021    ECHOCARDIOGRAM REPORT   Patient Name:   Kim [name] Date of Exam: 08/26/2021 Medical Rec #:  [id number]       Height:       61.0 in Accession #:    [id number]      Weight:       144.0 lb Date of Birth:  05-05-1955      BSA:  1.643 m Patient Age:    43 years        BP:           105/69 mmHg Patient Gender: F               HR:           91 bpm. Exam Location:  Inpatient Procedure: 2D Echo, Cardiac Doppler, Color Doppler and Intracardiac            Opacification Agent Indications:    Elevated troponin  History:        Patient has prior history of Echocardiogram examinations, most                 recent 12/19/2020. COPD.  Sonographer:    Joette Catching RCS Referring Phys: [phone] RONDELL A SMITH  Sonographer Comments: Technically difficult study due to poor echo windows. Image acquisition challenging due to COPD. IMPRESSIONS  1. Left ventricular ejection fraction, by estimation, is 35 to 40%. The left ventricle has moderately decreased function. The left ventricle demonstrates regional wall motion abnormalities (see scoring diagram/findings for description). There is hypokinesis of all mid LV segments although appears slightly worse in the mid septal LV segments. The apical LV segments appear hyperkinetic. 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 Clinical correlation recommended.  2. Left ventricular diastolic parameters are consistent with Grade I diastolic dysfunction (impaired relaxation).  3. Right ventricular systolic function is normal. The right ventricular size is normal.  4. The mitral valve is normal in structure. Trivial mitral valve regurgitation.  5. The aortic valve was not  well visualized. Aortic valve regurgitation is mild. Aortic valve sclerosis/calcification is present, without any evidence of aortic stenosis.  6. The inferior vena cava is normal in size with greater than 50% respiratory variability, suggesting right atrial pressure of 3 mmHg. Comparison(s): Compared to prior TTE in 11/2020, there is now hyperkinesis of the LV apical segments (previously akinetic) and hypokinesis of the mid LV segments. EF now 35-40% (previously 30-35%). 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 FINDINGS  Left Ventricle: There is hypokinesis of all mid LV segments although appears slightly worse in the mid septal LV segments. The apical LV segments appear hyperkinetic. 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 Clinical correlation recommended. Left ventricular ejection fraction, by estimation, is 35 to 40%. The left ventricle has moderately decreased function. The left ventricle demonstrates regional wall motion abnormalities. Definity  contrast agent was given IV to delineate the left ventricular endocardial borders. The left ventricular internal cavity size was normal in size. There is no left ventricular hypertrophy. Left ventricular diastolic parameters are consistent with Grade I diastolic dysfunction (impaired relaxation). Right Ventricle: The right ventricular size is normal. No increase in right ventricular wall thickness. Right ventricular systolic function is normal. Left Atrium: Left atrial size was normal in size. Right Atrium: Right atrial size was normal in size. Pericardium: There is no evidence of pericardial effusion. Mitral Valve: The mitral valve is normal in structure. Trivial mitral valve regurgitation. Tricuspid Valve: The tricuspid valve is normal in structure. Tricuspid valve regurgitation is not demonstrated. Aortic Valve: The aortic valve was not well visualized. Aortic valve  regurgitation is mild. Aortic regurgitation PHT measures 275 msec. Aortic valve sclerosis/calcification is present, without any evidence of aortic stenosis. Aortic valve mean gradient measures 4.5 mmHg. Aortic valve peak gradient measures 8.7 mmHg. Aortic  valve area, by VTI measures 1.99 cm. Pulmonic Valve: The pulmonic valve was not well visualized. Aorta: The aortic root and ascending aorta are structurally normal, with no evidence of dilitation. Venous: The inferior vena cava is normal in size with greater than 50% respiratory variability, suggesting right atrial pressure of 3 mmHg. IAS/Shunts: The atrial septum is grossly normal.  LEFT VENTRICLE PLAX 2D LVIDd:         4.80 cm      Diastology LVIDs:         3.70 cm      LV e' medial:    6.31 cm/s LV PW:         1.00 cm      LV E/e' medial:  11.4 LV IVS:        0.90 cm      LV e' lateral:   5.33 cm/s LVOT diam:     1.90 cm      LV E/e' lateral: 13.5 LV SV:         52 LV SV Index:   32 LVOT Area:     2.84 cm  LV Volumes (MOD) LV vol d, MOD A2C: 120.0 ml LV vol d, MOD A4C: 114.0 ml LV vol s, MOD A2C: 83.0 ml LV vol s, MOD A4C: 66.6 ml LV SV MOD A2C:     37.0 ml LV SV MOD A4C:     114.0 ml LV SV MOD BP:      46.3 ml RIGHT VENTRICLE RV S prime:     12.80 cm/s TAPSE (M-mode): 2.2 cm LEFT ATRIUM             Index LA diam:        2.70 cm 1.64 cm/m LA Vol (A2C):   22.3 ml 13.58 ml/m LA Vol (A4C):   12.0 ml 7.31 ml/m LA Biplane Vol: 17.8 ml 10.84 ml/m  AORTIC VALVE                     PULMONIC VALVE AV Area (Vmax):    2.06 cm      PV Vmax:       1.26 m/s AV Area (Vmean):   2.06 cm      PV Peak grad:  6.4 mmHg AV Area (VTI):     1.99 cm AV Vmax:           147.50 cm/s AV Vmean:          103.800 cm/s AV VTI:            0.263 m AV Peak Grad:      8.7 mmHg AV Mean Grad:      4.5 mmHg LVOT Vmax:         107.00 cm/s LVOT Vmean:        75.400 cm/s LVOT VTI:          0.185 m LVOT/AV VTI ratio: 0.70 AI PHT:            275 msec  AORTA Ao Root diam: 2.80 cm Ao Asc diam:  2.60  cm MITRAL VALVE                TRICUSPID VALVE MV Area (PHT): 8.92 cm     TR Peak grad:   26.0 mmHg MV Decel Time: 85 msec      TR Vmax:        255.00 cm/s MV E velocity: 72.20 cm/s MV A velocity: 103.00 cm/s  SHUNTS MV E/A  ratio:  0.70         Systemic VTI:  0.18 m                             Systemic Diam: 1.90 cm Gwyndolyn Kaufman MD Electronically signed by Gwyndolyn Kaufman MD Signature Date/Time: 08/26/2021/4:08:17 PM    Final      TODAY-DAY OF DISCHARGE:  Subjective:   Kim [name] today has no headache,no chest abdominal pain,no new weakness tingling or numbness, feels much better wants to go home today.   Objective:   Blood pressure (!) 144/69, pulse 70, temperature 97.7 F (36.5 C), temperature source Oral, resp. rate 17, height '5\' 1"'$  (1.549 m), weight 65.3 kg, SpO2 100 %.  Intake/Output Summary (Last 24 hours) at 08/29/2021 0911 Last data filed at 08/29/2021 0645 Gross per 24 hour  Intake 240 ml  Output 1300 ml  Net -1060 ml   Filed Weights   08/26/21 0429  Weight: 65.3 kg    Exam: Awake Alert, Oriented *3, No new F.N deficits, Normal affect [place].AT,PERRAL Supple Neck,No JVD, No cervical lymphadenopathy appriciated.  Symmetrical Chest wall movement, Good air movement bilaterally, CTAB RRR,No Gallops,Rubs or new Murmurs, No Parasternal Heave +ve B.Sounds, Abd Soft, Non tender, No organomegaly appriciated, No rebound -guarding or rigidity. No Cyanosis, Clubbing or edema, No new Rash or bruise   PERTINENT RADIOLOGIC STUDIES: CT CHEST WO CONTRAST  Result Date: 08/29/2021 CLINICAL DATA:  Abnormal xray - lung nodule, >= 1 cm Right upper lobe opacity on CXR EXAM: CT CHEST WITHOUT CONTRAST TECHNIQUE: Multidetector CT imaging of the chest was performed following the standard protocol without IV contrast. RADIATION DOSE REDUCTION: This exam was performed according to the departmental dose-optimization program which includes automated exposure control, adjustment of the mA and/or  kV according to patient size and/or use of iterative reconstruction technique. COMPARISON:  05/08/2021, 01/31/2020 FINDINGS: Cardiovascular: Moderate coronary artery calcification. Global cardiac size within normal limits. No pericardial effusion. Central pulmonary arteries are of normal caliber. Mild atherosclerotic calcification within the thoracic aorta. No aortic aneurysm. Mediastinum/Nodes: No enlarged mediastinal or axillary lymph nodes. Thyroid gland, trachea, and esophagus demonstrate no significant findings. Lungs/Pleura: Severe emphysema. No suspicious or indeterminate focal pulmonary nodules. Chronic volume loss within the right middle lobe. No confluent pulmonary infiltrates. No pneumothorax or pleural effusion. No central obstructing lesion. Upper Abdomen: Status post cholecystectomy.  No acute abnormality. Musculoskeletal: No acute bone abnormality. No lytic or blastic bone lesion. IMPRESSION: No suspicious or indeterminate focal pulmonary nodules. No acute intrathoracic pathology identified. Severe emphysema. Moderate coronary artery calcification. Aortic Atherosclerosis (ICD10-I70.0) and Emphysema (ICD10-J43.9). Electronically Signed   By: Fidela Salisbury M.D.   On: 08/29/2021 00:23     PERTINENT LAB RESULTS: CBC: Recent Labs    08/27/21 0224 08/28/21 0433  WBC 18.1* 17.3*  HGB 10.2* 10.1*  HCT 31.0* 31.2*  PLT 274 267   CMET CMP     Component Value Date/Time   NA 141 08/28/2021 0433   NA 145 (H) 12/21/2020 1216   K 4.1 08/28/2021 0433   CL 106 08/28/2021 0433   CO2 28 08/28/2021 0433   GLUCOSE 120 (H) 08/28/2021 0433   BUN 25 (H) 08/28/2021 0433   BUN 19 12/21/2020 1216   CREATININE 1.12 (H) 08/28/2021 0433   CREATININE 1.16 (H) 07/19/2015 1447   CALCIUM 9.0 08/28/2021 0433   PROT 4.9 (L) 11/16/2020 2103   PROT 6.4 01/25/2018 0811   ALBUMIN 3.2 (  L) 11/20/2020 0316   ALBUMIN 4.5 01/25/2018 0811   AST 19 11/16/2020 2103   ALT 15 11/16/2020 2103   ALKPHOS 49 11/16/2020  2103   BILITOT 0.5 11/16/2020 2103   BILITOT 0.2 01/25/2018 0811   GFRNONAA 54 (L) 08/28/2021 0433   GFRAA 85 05/31/2020 0935    GFR Estimated Creatinine Clearance: 42.2 mL/min (A) (by C-G formula based on SCr of 1.12 mg/dL (H)). No results for input(s): LIPASE, AMYLASE in the last 72 hours. No results for input(s): CKTOTAL, CKMB, CKMBINDEX, TROPONINI in the last 72 hours. Invalid input(s): POCBNP No results for input(s): DDIMER in the last 72 hours. No results for input(s): HGBA1C in the last 72 hours. No results for input(s): CHOL, HDL, LDLCALC, TRIG, CHOLHDL, LDLDIRECT in the last 72 hours. Recent Labs    08/26/21 1543  TSH 0.373   No results for input(s): VITAMINB12, FOLATE, FERRITIN, TIBC, IRON, RETICCTPCT in the last 72 hours. Coags: No results for input(s): INR in the last 72 hours.  Invalid input(s): PT Microbiology: No results found for this or any previous visit (from the past 240 hour(s)).  FURTHER DISCHARGE INSTRUCTIONS:  Get Medicines reviewed and adjusted: Please take all your medications with you for your next visit with your Primary MD  Laboratory/radiological data: Please request your Primary MD to go over all hospital tests and procedure/radiological results at the follow up, please ask your Primary MD to get all Hospital records sent to his/her office.  In some cases, they will be blood work, cultures and biopsy results pending at the time of your discharge. Please request that your primary care M.D. goes through all the records of your hospital data and follows up on these results.  Also Note the following: If you experience worsening of your admission symptoms, develop shortness of breath, life threatening emergency, suicidal or homicidal thoughts you must seek medical attention immediately by calling 911 or calling your MD immediately  if symptoms less severe.  You must read complete instructions/literature along with all the possible adverse  reactions/side effects for all the Medicines you take and that have been prescribed to you. Take any new Medicines after you have completely understood and accpet all the possible adverse reactions/side effects.   Do not drive when taking Pain medications or sleeping medications (Benzodaizepines)  Do not take more than prescribed Pain, Sleep and Anxiety Medications. It is not advisable to combine anxiety,sleep and pain medications without talking with your primary care practitioner  Special Instructions: If you have smoked or chewed Tobacco  in the last 2 yrs please stop smoking, stop any regular Alcohol  and or any Recreational drug use.  Wear Seat belts while driving.  Please note: You were cared for by a hospitalist during your hospital stay. Once you are discharged, your primary care physician will handle any further medical issues. Please note that NO REFILLS for any discharge medications will be authorized once you are discharged, as it is imperative that you return to your primary care physician (or establish a relationship with a primary care physician if you do not have one) for your post hospital discharge needs so that they can reassess your need for medications and monitor your lab values.  Total Time spent coordinating discharge including counseling, education and face to face time equals greater than 30 minutes.  SignedOren Binet 08/29/2021 9:11 AM

## 2021-08-29 NOTE — TOC Transition Note (Signed)
Transition of Care [facility]) - CM/SW Discharge Note   Patient Details  Name: Kim [name] MRN: [id number] Date of Birth: 1955-06-04  [facility] of [facility]) CM/SW Contact:  Benard Halsted, LCSW Phone Number: 08/29/2021, 1:37 PM   Clinical Narrative:    Patient will DC to: [facility] Anticipated DC date: 08/29/21 Family notified: Spouse Transport by: Spouse by car with home O2   Per MD patient ready for DC to [facility]. RN to call report prior to discharge (). RN, patient, patient's family, and facility notified of DC. Discharge Summary and FL2 sent to facility. Signed scripts on chart.   CSW will sign off for now as social work intervention is no longer needed. Please consult [place] again if new needs arise.     Final next level of care: Skilled Nursing Facility Barriers to Discharge: Barriers Resolved   Patient Goals and CMS Choice Patient states their goals for this hospitalization and ongoing recovery are:: Rehab CMS Medicare.gov Compare Post Acute Care list provided to:: Patient Choice offered to / list presented to : Patient, Spouse  Discharge Placement   Existing PASRR number confirmed : 08/29/21          Patient chooses bed at: [facility] Patient to be transferred to facility by: car Name of family member notified: Spouse Patient and family notified of of transfer: 08/29/21  Discharge Plan and Services In-house Referral: Clinical Social Work   Post Acute Care Choice: [place]                               Social Determinants of Health (SDOH) Interventions     Readmission Risk Interventions     View : No data to display.

## 2021-08-29 NOTE — Progress Notes (Signed)
Occupational Therapy Treatment Patient Details Name: Kim [name] MRN: [id number] DOB: Dec 09, 1955 Today's Date: 08/29/2021   History of present illness Pt is a 66 y.o. female admitted due to falling backwards and lost consciousness. Pt has reported a headache since the fall and change4s in memory.  CT showed trace subarachnoid hemorrhage. PMH includes COPD (2L O2 baseline), HTN, cardiomyopathy, osteoporosis, anxiety, depression.   OT comments  Pt preparing to discharge. Toileted on BSC and donned LB clothing with min guard assist.    Recommendations for follow up therapy are one component of a multi-disciplinary discharge planning process, led by the attending physician.  Recommendations may be updated based on patient status, additional functional criteria and insurance authorization.    Follow Up Recommendations  Skilled nursing-short term rehab (<3 hours/day)    Assistance Recommended at Discharge Frequent or constant Supervision/Assistance  Patient can return home with the following  A little help with walking and/or transfers;A little help with bathing/dressing/bathroom;Assistance with cooking/housework;Assist for transportation   Equipment Recommendations  Other (comment) (defer to next venue)    Recommendations for Other Services      Precautions / Restrictions Precautions Precautions: Fall;Other (comment) Precaution Comments: anxious, orthostatic sx       Mobility Bed Mobility Overal bed mobility: Needs Assistance Bed Mobility: Sit to Supine       Sit to supine: Supervision   General bed mobility comments: supervision for lines    Transfers Overall transfer level: Needs assistance   Transfers: Sit to/from Stand, Bed to chair/wheelchair/BSC Sit to Stand: Min guard                 Balance Overall balance assessment: Needs assistance   Sitting balance-Leahy Scale: Good       Standing balance-Leahy Scale: Fair                              ADL either performed or assessed with clinical judgement   ADL Overall ADL's : Needs assistance/impaired                     Lower Body Dressing: Min guard;Cueing for safety;Cueing for sequencing;Sit to/from stand Lower Body Dressing Details (indicate cue type and reason): panties and pajama pants Toilet Transfer: Min guard;Stand-pivot;BSC/3in1   Toileting- Clothing Manipulation and Hygiene: Min guard;Sit to/from stand         General ADL Comments: Educated pt in energy conservation and pursed lip breathing, pacing, pt verbalized understanding.    Extremity/Trunk Assessment              Vision       Perception     Praxis      Cognition Arousal/Alertness: Awake/alert Behavior During Therapy: WFL for tasks assessed/performed Overall Cognitive Status: Within Functional Limits for tasks assessed                                          Exercises      Shoulder Instructions       General Comments      Pertinent Vitals/ Pain       Pain Assessment Pain Assessment: Faces Faces Pain Scale: Hurts even more Pain Location: head Pain Descriptors / Indicators: Discomfort, Headache Pain Intervention(s): Monitored during session, Premedicated before session, Repositioned  Home Living  Prior Functioning/Environment              Frequency  Min 2X/week        Progress Toward Goals  OT Goals(current goals can now be found in the care plan section)  Progress towards OT goals: Progressing toward goals  Acute Rehab OT Goals OT Goal Formulation: With patient Time For Goal Achievement: 09/10/21 Potential to Achieve Goals: Good  Plan Discharge plan remains appropriate    Co-evaluation                 AM-PAC OT "6 Clicks" Daily Activity     Outcome Measure   Help from another person eating meals?: None Help from another person taking care of personal grooming?: A  Little Help from another person toileting, which includes using toliet, bedpan, or urinal?: A Little Help from another person bathing (including washing, rinsing, drying)?: A Little Help from another person to put on and taking off regular upper body clothing?: None Help from another person to put on and taking off regular lower body clothing?: A Little 6 Click Score: 20    End of Session Equipment Utilized During Treatment: Oxygen  OT Visit Diagnosis: Unsteadiness on feet (R26.81);Other abnormalities of gait and mobility (R26.89);Pain   Activity Tolerance Patient tolerated treatment well   Patient Left in bed;with call bell/phone within reach;with bed alarm set   Nurse Communication          Time: 1497-0263 OT Time Calculation (min): 22 min  Charges: OT General Charges $OT Visit: 1 Visit OT Treatments $Self Care/Home Management : 8-22 mins  Nestor Lewandowsky, OTR/L Acute Rehabilitation Services Pager: [phone] Office: [phone]   Malka So 08/29/2021, 3:23 PM

## 2021-08-29 NOTE — Progress Notes (Unsigned)
Enrolled for [employer] to mail a ZIO XT long term holter monitor to the patients address on file.   Dr. Angelena Form to read.  [id number] mailed to patient/ applied in office.

## 2021-08-29 NOTE — Plan of Care (Signed)
  Problem: Education: Goal: Knowledge of disease or condition will improve Outcome: Adequate for Discharge Goal: Knowledge of the prescribed therapeutic regimen will improve Outcome: Adequate for Discharge Goal: Individualized Educational Video(s) Outcome: Adequate for Discharge   Problem: Activity: Goal: Ability to tolerate increased activity will improve Outcome: Adequate for Discharge Goal: Will verbalize the importance of balancing activity with adequate rest periods Outcome: Adequate for Discharge   Problem: Respiratory: Goal: Ability to maintain a clear airway will improve Outcome: Adequate for Discharge Goal: Levels of oxygenation will improve Outcome: Adequate for Discharge Goal: Ability to maintain adequate ventilation will improve Outcome: Adequate for Discharge   Problem: Education: Goal: Knowledge of disease or condition will improve Outcome: Adequate for Discharge Goal: Knowledge of the prescribed therapeutic regimen will improve Outcome: Adequate for Discharge Goal: Individualized Educational Video(s) Outcome: Adequate for Discharge   Problem: Activity: Goal: Ability to tolerate increased activity will improve Outcome: Adequate for Discharge Goal: Will verbalize the importance of balancing activity with adequate rest periods Outcome: Adequate for Discharge   Problem: Respiratory: Goal: Ability to maintain a clear airway will improve Outcome: Adequate for Discharge Goal: Levels of oxygenation will improve Outcome: Adequate for Discharge Goal: Ability to maintain adequate ventilation will improve Outcome: Adequate for Discharge   Problem: Education: Goal: Knowledge of General Education information will improve Description: Including pain rating scale, medication(s)/side effects and non-pharmacologic comfort measures Outcome: Adequate for Discharge   Problem: Health Behavior/Discharge Planning: Goal: Ability to manage health-related needs will  improve Outcome: Adequate for Discharge   Problem: Clinical Measurements: Goal: Ability to maintain clinical measurements within normal limits will improve Outcome: Adequate for Discharge Goal: Will remain free from infection Outcome: Adequate for Discharge Goal: Diagnostic test results will improve Outcome: Adequate for Discharge Goal: Respiratory complications will improve Outcome: Adequate for Discharge Goal: Cardiovascular complication will be avoided Outcome: Adequate for Discharge   Problem: Activity: Goal: Risk for activity intolerance will decrease Outcome: Adequate for Discharge   Problem: Nutrition: Goal: Adequate nutrition will be maintained Outcome: Adequate for Discharge   Problem: Coping: Goal: Level of anxiety will decrease Outcome: Adequate for Discharge   Problem: Elimination: Goal: Will not experience complications related to bowel motility Outcome: Adequate for Discharge Goal: Will not experience complications related to urinary retention Outcome: Adequate for Discharge   Problem: Pain Managment: Goal: General experience of comfort will improve Outcome: Adequate for Discharge   Problem: Safety: Goal: Ability to remain free from injury will improve Outcome: Adequate for Discharge   Problem: Skin Integrity: Goal: Risk for impaired skin integrity will decrease Outcome: Adequate for Discharge

## 2021-08-29 NOTE — Progress Notes (Signed)
This RN phoned the receiving facility to give report and the nurse was unavailable, left contact information with rehab secretary, will try again soon if no return call is received.

## 2021-08-29 NOTE — TOC Progression Note (Addendum)
Transition of Care [facility]) - Progression Note    Patient Details  Name: KYISHA FOWLE MRN: [id number] Date of Birth: Jan 29, 1956  Transition of Care [facility]) CM/SW [place], LCSW Phone Number: 08/29/2021, 12:49 PM  Clinical Narrative:    9:30am-CSW provided SNF bed offers to patient and her spouse at bedside. They have selected [facility] as it is close to their home. Per [facility], they are in a state survey so they will have to ask their administrator. CSW encouraged patient to choose a second option just in case.   1:30pm-[facility] able to accept patient today. CSW notified patient's spouse to transport patient to [facility] at 3pm. He will bring her home oxygen for transport.    Expected Discharge Plan: [place] Barriers to Discharge: Continued Medical Work up, SNF Pending bed offer  Expected Discharge Plan and Services Expected Discharge Plan: [place] In-house Referral: Clinical Social Work   Post Acute Care Choice: [place] Living arrangements for the past 2 months: Single Family Home Expected Discharge Date: 08/29/21                                     Social Determinants of Health (SDOH) Interventions    Readmission Risk Interventions     View : No data to display.

## 2021-08-29 NOTE — Consult Note (Signed)
   [facility] CM Inpatient Consult   08/29/2021  Kim [name] April 29, 1955 [id number]  [place] [facility] [ACO] Patient: Medicare ACO REACH  Primary Care Provider:  [name], NP, [facility], at [facility], is an embedded provider with a Chronic Care Management team and program, and is listed for the transition of care follow up and appointments.  Patient was screened for Embedded practice service needs for chronic care management and current disposition is for a skilled nursing facility level of care for transition.  Plan: Will alert [facility] PAC RN of disposition if transitioning to a [facility] affiliated facility for community follow up for potential  post facility needs.  Please contact for further questions,  Natividad Brood, RN BSN [facility] Liaison  [phone] business mobile phone Toll free office [phone]  Fax number: [phone] [email or web address]

## 2021-09-03 ENCOUNTER — Other Ambulatory Visit: Payer: Self-pay | Admitting: *Deleted

## 2021-09-03 DIAGNOSIS — J441 Chronic obstructive pulmonary disease with (acute) exacerbation: Secondary | ICD-10-CM

## 2021-09-03 NOTE — Patient Outreach (Signed)
[facility] eligible member screened for potential [facility] care coordination services as a benefit of member's insurance plan. Verified in [facility],  Kim [name] resided in [facility] from 08/29/21 to 09/02/21.   Member's PCP at [facility] has [place] care coordination services available to member if needed post SNF.  Telephone call made to Kim [name] at [phone] to discuss [facility] follow up. Patient identifiers confirmed. Kim [name] endorses she did not have home health arranged. States she decided to leave facility as she was not happy there. States facility would not arrange home health prior to her departure. Kim [name] reports she needs a rolling walker and home health PT. Discussed Probation officer will contact PCP office to request. Kim [name] states she lives in a small place. Lives with spouse. States she is unstable in walking. Reports she has a rollator but it is too big to maneuver in the house. States she is not sure if it has been longer than 5 years or not that she received the rollator. Kim [name] endorses PCP appointment is scheduled this week.   Discussed [facility] care coordination services. Kim [name] is agreeable. Kim [name] asks outreach be made on Friday or after. States she has medical appointments scheduled on Wednesday and Thursday. Writer will email Kim [name] writer's contact information as well as [facility] brochure and [facility] website information.  Telephone call made to [employer] Spoke with Constance Holster to make aware member left SNF without home health and DME. Requested home health PT, shower chair, and rolling walker to be ordered.  Will make referral for [facility] RNCM care coordination services.    Marthenia Rolling, MSN, RN,BSN [place] Acute Care Coordinator [phone] [facility]) [phone]  (Toll free office)

## 2021-09-04 ENCOUNTER — Ambulatory Visit (INDEPENDENT_AMBULATORY_CARE_PROVIDER_SITE_OTHER): Payer: Medicare Other | Admitting: Pulmonary Disease

## 2021-09-04 ENCOUNTER — Ambulatory Visit: Payer: Medicare Other

## 2021-09-04 ENCOUNTER — Encounter: Payer: Self-pay | Admitting: Pulmonary Disease

## 2021-09-04 VITALS — BP 120/68 | HR 77 | Temp 98.0°F | Ht 61.75 in | Wt 139.6 lb

## 2021-09-04 DIAGNOSIS — M8008XS Age-related osteoporosis with current pathological fracture, vertebra(e), sequela: Secondary | ICD-10-CM

## 2021-09-04 DIAGNOSIS — J9611 Chronic respiratory failure with hypoxia: Secondary | ICD-10-CM

## 2021-09-04 DIAGNOSIS — R55 Syncope and collapse: Secondary | ICD-10-CM | POA: Diagnosis not present

## 2021-09-04 DIAGNOSIS — J9612 Chronic respiratory failure with hypercapnia: Secondary | ICD-10-CM

## 2021-09-04 DIAGNOSIS — J441 Chronic obstructive pulmonary disease with (acute) exacerbation: Secondary | ICD-10-CM

## 2021-09-04 MED ORDER — AZITHROMYCIN 250 MG PO TABS: 250.0000 mg | ORAL_TABLET | Freq: Every day | ORAL | 0 refills | Status: DC

## 2021-09-04 MED ORDER — PREDNISONE 10 MG PO TABS: 10.0000 mg | ORAL_TABLET | Freq: Every day | ORAL | 2 refills | Status: DC

## 2021-09-04 NOTE — Patient Instructions (Signed)
  X Rx for NIV will be sent to DME - use during sleep EVERY night  X Refill on Prednisone 10 mg daily x 60 x 3 refills

## 2021-09-04 NOTE — Assessment & Plan Note (Signed)
No episodes of syncope seem to be related to orthostatic hypotension. She has cardiology appointment pending to review. Losartan and metoprolol seem to be very low-dose and blood pressure appears okay today

## 2021-09-04 NOTE — Assessment & Plan Note (Addendum)
She will qualify for NIV due to repeated exacerbations. PCO2 was 50 on last ABG. We will send in a prescription to DME wit auto AVAPS settings  The patient continues to exhibit signs of hypercapnea associated with chronic respiratory failure secondary to severe COPD s.  Interruption or failure to provide NIV would quickly lead to exacerbation of the patient's condition, hospital readmission, and likely harm the patient.  Continued use is preferred.  The use of the NIV will treat the patient's PCO2 levels and can reduce the risk of exacerbations and future hospitalizations when used at night and during the day.  Bilevel/RAD therapy with and without a rate would be ineffective as the patient requires a volume targeted mode.  Ventilation is required to decrease the work of breathing and improve pulmonary status.  Interruption of ventilator support would lead to a decline of health status.  Patient is able to protect their airways and clear secretions on their own.

## 2021-09-04 NOTE — Progress Notes (Signed)
   Subjective:    Patient ID: Kim [name], female    DOB: 02/09/1956, 66 y.o.   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   Meds-stopped Daliresp due to skin peeling 01/2021     COPD exacerbations: Multiple since 2020, 2 hospitalizations in 2021 03/2020 hospitalized for 10 days for COPD exacerbation 09/2020 developed COVID infection, received monoclonal antibody 11/2020 adm for COPD exacerbation and stress-induced cardiomyopathy/acute systolic heart failure/type II myocardial infarction secondary to cardiomyopathy   02/2021 - treated for COPD flare, neighbor burning >> exposure to fumes  04/2021 video visit >> pred taper 06/2021 hosp visit for exac  Last office visit 06/2021 Unfortunately she had an exacerbation 3/22 requiring hospital admission.  She was treated with BiPAP during this hospital visit and tolerated well.  She was then evaluated in the pulmonary office in April. She had another exacerbation with shortness of breath and green sputum, had a syncopal episode 3 days prior to hospital admission 08/26/2021, reviewed discharge summary Head CT showed trace subdural hematoma. ABG was 7.3 9/50/95 She reports repeated episodes of syncope, was found to be orthostatic in neurology office, reviewed their consultation.  She has been seen by endocrine, was schedule for Reclast infusion  Arrives accompanied by her husband today, in a wheelchair on oxygen.  Breathing is improved and back to baseline She is compliant with Symbicort and Spiriva     Significant tests/ events reviewed  09/2020 ABG 7.3 7/46/82 01/2017 alpha-1 >> 102 nml level ,PI* M1N   11/2014: FVC 2.00 L (63%) FEV1 1.00 L (41%) ratio 50  negative bronchodilator response TLC 5.06 L (103%) RV 140% DLCO uncorrected 51%    08/2021>> CT chest: No suspicious or  indeterminate focal pulmonary nodule seen. LDCT chest 05/2021 LLL nodule 4 mm, stable CT angiogram chest 09/2020 no airspace disease   CT angiogram chest 03/24/2020  Severe centrilobular emphysema with mild superimposed diffuse ground-glass opacity with an apical predominance, similar in comparison to prior study. Findings likely reflect smoking related lung disease/respiratory bronchiolitis. New mild compression fracture deformity of L1 since 10/ 2021 01/2020 LDCT - stable, T7 fracture 12/2018 LD CT chest - stable nodules   10/2016 CT chest showed subcentimeter nodules which have been stable since 2017, right adrenal nodule was stable since 2015 and considered benign  Review of Systems neg for any significant sore throat, dysphagia, itching, sneezing, nasal congestion or excess/ purulent secretions, fever, chills, sweats, unintended wt loss, pleuritic or exertional cp, hempoptysis, orthopnea pnd or change in chronic leg swelling. Also denies presyncope, palpitations, heartburn, abdominal pain, nausea, vomiting, diarrhea or change in bowel or urinary habits, dysuria,hematuria, rash, arthralgias, visual complaints, headache, numbness weakness or ataxia.     Objective:   Physical Exam  Gen. Pleasant, well-nourished, in no distress ENT - no thrush, no pallor/icterus,no post nasal drip Neck: No JVD, no thyromegaly, no carotid bruits Lungs: no use of accessory muscles, no dullness to percussion, decreased without rales or rhonchi  Cardiovascular: Rhythm regular, heart sounds  normal, no murmurs or gallops, no peripheral edema Musculoskeletal: No deformities, no cyanosis or clubbing  Skin-bruising over both arms       Assessment & Plan:

## 2021-09-04 NOTE — Assessment & Plan Note (Signed)
Unfortunately she continues to have repeated exacerbations in spite of open triple therapy with Symbicort and Spiriva. She is always responsive to prednisone and antibiotic.  We will give a prescription for Z-Pak pending in her pharmacy.  Unfortunately this means that we may not be able to taper prednisone at least in the short run.  We will keep her at baseline 10 mg.  We discussed COPD action plan. We discussed signs and symptoms of COPD exacerbation

## 2021-09-04 NOTE — Assessment & Plan Note (Signed)
Reclast infusion was scheduled but unfortunately she has to defer due to current exacerbation.  She will reschedule appointment. Unfortunately does not seem like we will be able to taper her off prednisone anytime soon

## 2021-09-04 NOTE — Addendum Note (Signed)
Addended by: Gavin Potters R on: 09/04/2021 01:59 PM   Modules accepted: Orders

## 2021-09-05 ENCOUNTER — Ambulatory Visit (INDEPENDENT_AMBULATORY_CARE_PROVIDER_SITE_OTHER): Payer: Medicare Other | Admitting: Adult Health

## 2021-09-05 ENCOUNTER — Encounter: Payer: Self-pay | Admitting: Adult Health

## 2021-09-05 VITALS — BP 122/60 | HR 87 | Temp 98.1°F | Ht 61.75 in | Wt 142.0 lb

## 2021-09-05 DIAGNOSIS — J441 Chronic obstructive pulmonary disease with (acute) exacerbation: Secondary | ICD-10-CM | POA: Diagnosis not present

## 2021-09-05 DIAGNOSIS — I609 Nontraumatic subarachnoid hemorrhage, unspecified: Secondary | ICD-10-CM

## 2021-09-05 DIAGNOSIS — F419 Anxiety disorder, unspecified: Secondary | ICD-10-CM | POA: Diagnosis not present

## 2021-09-05 DIAGNOSIS — I10 Essential (primary) hypertension: Secondary | ICD-10-CM

## 2021-09-05 DIAGNOSIS — S065XAA Traumatic subdural hemorrhage with loss of consciousness status unknown, initial encounter: Secondary | ICD-10-CM

## 2021-09-05 DIAGNOSIS — F32A Depression, unspecified: Secondary | ICD-10-CM

## 2021-09-05 DIAGNOSIS — R42 Dizziness and giddiness: Secondary | ICD-10-CM

## 2021-09-05 DIAGNOSIS — I5032 Chronic diastolic (congestive) heart failure: Secondary | ICD-10-CM

## 2021-09-05 DIAGNOSIS — R55 Syncope and collapse: Secondary | ICD-10-CM

## 2021-09-05 MED ORDER — ALPRAZOLAM 0.5 MG PO TABS: 0.5000 mg | ORAL_TABLET | Freq: Three times a day (TID) | ORAL | 1 refills | Status: DC | PRN

## 2021-09-05 MED ORDER — BUTALBITAL-APAP-CAFFEINE 50-325-40 MG PO TABS
1.0000 | ORAL_TABLET | Freq: Four times a day (QID) | ORAL | 0 refills | Status: DC | PRN
Start: 2021-09-05 — End: 2021-09-05

## 2021-09-05 MED ORDER — BUTALBITAL-APAP-CAFFEINE 50-325-40 MG PO TABS: 1.0000 | ORAL_TABLET | Freq: Four times a day (QID) | ORAL | 0 refills | Status: AC | PRN

## 2021-09-05 MED ORDER — ONDANSETRON HCL 4 MG PO TABS: 4.0000 mg | ORAL_TABLET | Freq: Three times a day (TID) | ORAL | 0 refills | Status: DC | PRN

## 2021-09-05 NOTE — Progress Notes (Unsigned)
Subjective:    Patient ID: Kim [name], female    DOB: 31-Dec-1955, 66 y.o.   MRN: [id number]  HPI 66 year old female who  has a past medical history of C8 RADICULOPATHY (06/05/2009), COPD (08/31/2007), DEPRESSION (11/19/2006), DYSLIPIDEMIA (03/12/2009), MI (mitral incompetence), NEPHROLITHIASIS (01/05/2008), OSTEOARTHRITIS (06/05/2009), PARESTHESIA (08/24/2007), and TOBACCO ABUSE (01/25/2010).  She is being evaluated today for TCM visit   Admit Date 08/26/2021 Discharge Date 08/29/2021 Discharge from [facility] 09/02/2021  He presented to the ER with syncope resulting in a fall/head trauma and shortness of breath.  She was found to have COPD exacerbation and a subdural hematoma.  She was subsequently admitted to the hospital service.  Hospital Course  Acute on Chronic hypoxic/hypercanic respiratory failure due to COPD exacerbation  -Upon discharge she was significantly better and back to her usual regimen of 2 L of oxygen and bronchodilators.  In the hospital she was given IV steroids and empiric antibiotics.  She was discharged home bronchodilators and steroid taper.  Traumatic SAH/SDH: -She was evaluated by neurosurgery who recommended nonsurgical management  Syncope -Imaging negative.  Echo with EF of 35 to 40%.  Cardiology recommended no further work-up  Chronic heart failure reduced EF-likely due to Takotsubo cardiomyopathy -Recommended repeat echocardiogram as outpatient.  No evidence of CAD by cath in August 2022  Hypertension -BP relatively stable-continue losartan and metoprolol  Anxiety/Depression  -Stable.  Continue with Paxil and Xanax as needed  Today in the office she is accompanied by her husband, in a wheelchair and on oxygen therapy.  She feels as though her breathing has improved and is back to baseline.  She reports that she had an awful experience while at the rehab facility and is happy to be back at home.  She continues to have headaches and dizziness throughout the day.   She does report that while at the rehab facility she was able to have 1 session of vestibular rehab and this really did help with her dizziness.  She is wondering if she can continue this outpatient.  She was prescribed Fioricet for her headaches and found this to be beneficial, would like to have a refill of this medication.  He also needs a refill of Xanax to help with panic attacks and breathing.   Review of Systems  Eyes: Negative.   Respiratory:  Positive for shortness of breath.   Cardiovascular: Negative.   Endocrine: Negative.   Genitourinary: Negative.   Skin: Negative.   Allergic/Immunologic: Negative.   Neurological:  Positive for dizziness and headaches.  Hematological: Negative.   Psychiatric/Behavioral:  The patient is nervous/anxious.   All other systems reviewed and are negative.  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   OSTEOARTHRITIS 06/05/2009   PARESTHESIA 08/24/2007   TOBACCO ABUSE 01/25/2010    Social History   Socioeconomic History   Marital status: Married    Spouse name: [name]   Number of children: 2   Years of education: Not on file   Highest education level: Not on file  Occupational History   Occupation: Patient care aide-sits with elderly lady  Tobacco Use   Smoking status: Former    Packs/day: 2.00    Years: 38.00    Pack years: 76.00    Types: Cigarettes    Quit date: 03/07/2014    Years since quitting: 7.5  Smokeless tobacco: Never  Vaping Use   Vaping Use: Never used  Substance and Sexual Activity   Alcohol use: No    Alcohol/week: 0.0 standard drinks   Drug use: No   Sexual activity: Not on file  Other Topics Concern   Not on file  Social History Narrative   [facility] (01/12/17):   Originally from [facility]. Has lived in [place] for 17 years. Married. Has 2 dogs currently. No mold  and only 1 indoor plant. Had her home professionally cleaned a month ago. No bird or hot tub exposure. 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    Right handed    Social Determinants of Health   Financial Resource Strain: Low Risk    Difficulty of Paying Living Expenses: Not hard at all  Food Insecurity: No Food Insecurity   Worried About Charity fundraiser in the Last Year: Never true   Ran Out of Food in the Last Year: Never true  Transportation Needs: No Transportation Needs   Lack of Transportation (Medical): No   Lack of Transportation (Non-Medical): No  Physical Activity: Inactive   Days of Exercise per Week: 0 days   Minutes of Exercise per Session: 0 min  Stress: No Stress Concern Present   Feeling of Stress : Not at all  Social Connections: Moderately Isolated   Frequency of Communication with Friends and Family: More than three times a week   Frequency of Social Gatherings with Friends and Family: More than three times a week   Attends Religious Services: Never   Marine scientist or Organizations: No   Attends Archivist Meetings: Never   Marital Status: Married  Human resources officer Violence: Not At Risk   Fear of Current or Ex-Partner: No   Emotionally Abused: No   Physically Abused: No   Sexually Abused: No    Past Surgical History:  Procedure Laterality Date   [place] N/A 07/05/2015   Procedure: Left Heart Cath and Coronary Angiography;  Surgeon: Leonie Man, MD;  Location: [place] [facility];  Service: Cardiovascular;  Laterality: N/A;   CHOLECYSTECTOMY  1989   collapse lung  1990   COLONOSCOPY WITH PROPOFOL N/A 05/14/2018   Procedure: COLONOSCOPY WITH PROPOFOL;  Surgeon: Irene Shipper, MD;  Location: [facility];  Service: Endoscopy;  Laterality: N/A;   EXCISION MASS ABDOMINAL  2019   EXPLORATORY LAPAROTOMY  1969   LEFT HEART CATH AND CORONARY  ANGIOGRAPHY N/A 11/19/2020   Procedure: LEFT HEART CATH AND CORONARY ANGIOGRAPHY;  Surgeon: [name], MD;  Location: [place] [facility];  Service: Cardiovascular;  Laterality: N/A;   POLYPECTOMY  05/14/2018   Procedure: POLYPECTOMY;  Surgeon: Irene Shipper, MD;  Location: [facility];  Service: Endoscopy;;   SALPINGOOPHORECTOMY  2019    Family History  Problem Relation Age of Onset   Cancer Mother        lung   Heart attack Mother    Hypertension Sister    Multiple sclerosis Brother    Diabetes Sister    Rheum arthritis Sister    Thyroid disease Sister    Multiple sclerosis Other    Alcohol abuse Other    Arthritis Other    Diabetes Other    Kidney disease Other    Cancer Other        lung,ovarian,skin, uterine   Stroke Other  Heart disease Other    Melanoma Other    Osteoporosis Other    Heart attack Maternal Uncle    Heart attack Other        [place]   Heart attack Other        NEPHEW   Hypertension Brother    Stroke Maternal Grandmother    Colon cancer Neg Hx     Allergies  Allergen Reactions   Tape Other (See Comments)    Skin tears easily, peels skin off   Daliresp [Roflumilast]     Skin peeling   Latex Other (See Comments)    Peels skin off   Lisinopril Cough   Morphine And Related     hallucinations   Penicillins Hives and Other (See Comments)    Tolerates Omnicef Has patient had a PCN reaction causing immediate rash, facial/tongue/throat swelling, SOB or lightheadedness with hypotension: Yes Has patient had a PCN reaction causing severe rash involving mucus membranes or skin necrosis: No Has patient had a PCN reaction that required hospitalization No Has patient had a PCN reaction occurring within the last 10 years: No If all of the above answers are "NO", then may proceed with Cephalosporin use.    Omnicef [Cefdinir] Rash    Current Outpatient Medications on File Prior to Visit  Medication Sig Dispense Refill   acetaminophen (TYLENOL) 500  MG tablet Take 500 mg by mouth 2 (two) times daily as needed for headache.     albuterol (VENTOLIN HFA) 108 (90 Base) MCG/ACT inhaler INHALE TWO PUFFS INTO LUNGS EVERY 4 TO 6 HOURS AS NEEDED FOR SHORTNESS OF BREATH/WHEEZING. (Patient taking differently: Inhale 2 puffs into the lungs See admin instructions. 2 puffs every 4-6 hours as needed for shortness of breath and wheezing.) 18 g 1   aspirin EC 81 MG EC tablet Take 1 tablet (81 mg total) by mouth daily.     azithromycin (ZITHROMAX) 250 MG tablet Take 1 tablet (250 mg total) by mouth daily. 6 tablet 0   Calcium Carb-Cholecalciferol (CALCIUM 600 + D PO) Take 600 mg by mouth daily. Calcium 600 mg + 4000 unit D3     cetirizine (ZYRTEC) 10 MG tablet Take 10 mg by mouth daily.     Cholecalciferol (VITAMIN D3 PO) Take 5,000 Units by mouth daily.     furosemide (LASIX) 40 MG tablet Take 1 tablet (40 mg total) by mouth daily as needed (for an overnight weight gain of 3-5 pounds of fluid). 90 tablet 1   Ginkgo Biloba 120 MG CAPS Take 120 mg by mouth daily.     HYDROcodone-acetaminophen (NORCO) 7.5-325 MG tablet Take 1 tablet by mouth every 6 (six) hours as needed for moderate pain. 20 tablet 0   ipratropium-albuterol (DUONEB) 0.5-2.5 (3) MG/3ML SOLN USE 3 ML VIA NEBULIZER EVERY 6 HOURS AS NEEDED.  DX: J44.9 (Patient taking differently: Take 3 mLs by nebulization. 3 ml nebulization five times a day as needed for shortness of breath) 360 mL 5   lidocaine (LIDODERM) 5 % Place 1 patch onto the skin daily. Remove & Discard patch within 12 hours or as directed by MD 30 patch 0   losartan (COZAAR) 25 MG tablet Take 1 tablet (25 mg total) by mouth daily. 90 tablet 3   magnesium oxide (MAG-OX) 400 MG tablet Take 400 mg by mouth daily.     metoprolol succinate (TOPROL-XL) 25 MG 24 hr tablet Take 0.5 tablets (12.5 mg total) by mouth daily. 45 tablet 3   MUCINEX MAXIMUM  STRENGTH 1200 MG TB12 Take 1,200 mg by mouth every 12 (twelve) hours.     nitroGLYCERIN (NITROSTAT)  0.4 MG SL tablet Place 1 tablet (0.4 mg total) under the tongue every 5 (five) minutes as needed for chest pain. 25 tablet 3   nystatin (MYCOSTATIN) 100000 UNIT/ML suspension Take 5 mLs by mouth 3 (three) times daily as needed (thrush).     Olopatadine HCl (PATADAY OP) Place 1 drop into the left eye daily.     OXYGEN Inhale 2 L/min into the lungs continuous.     PARoxetine (PAXIL) 30 MG tablet Take 1 tablet (30 mg total) by mouth daily. 90 tablet 1   Potassium 99 MG TABS Take 99 mg by mouth daily.     predniSONE (DELTASONE) 10 MG tablet Take 1 tablet (10 mg total) by mouth daily. 60 tablet 2   Spacer/Aero Chamber Mouthpiece MISC 1 Device by Does not apply route as directed. 1 each 0   SYMBICORT 160-4.5 MCG/ACT inhaler INHALE 2 PUFFS TWICE DAILY (Patient taking differently: 2 puffs 2 (two) times daily.) 30 g 3   Tiotropium Bromide Monohydrate (SPIRIVA RESPIMAT) 2.5 MCG/ACT AERS Inhale 2 puffs into the lungs daily. (Patient taking differently: Inhale 2 puffs into the lungs daily as needed (wheezing).) 4 g 5   No current facility-administered medications on file prior to visit.    BP 122/60   Pulse 87   Temp 98.1 F (36.7 C) (Oral)   Ht 5' 1.75" (1.568 m)   Wt 142 lb (64.4 kg)   SpO2 93%   BMI 26.18 kg/m       Objective:   Physical Exam Vitals and nursing note reviewed.  Constitutional:      Appearance: Normal appearance.  HENT:     Right Ear: Tympanic membrane, ear canal and external ear normal. There is no impacted cerumen.     Left Ear: Tympanic membrane, ear canal and external ear normal. There is no impacted cerumen.     Nose: Congestion present.     Mouth/Throat:     Mouth: Mucous membranes are dry.  Eyes:     Extraocular Movements:     Right eye: Nystagmus (horizontal) present.     Left eye: Nystagmus (horizontal) present.  Cardiovascular:     Rate and Rhythm: Normal rate and regular rhythm.     Pulses: Normal pulses.     Heart sounds: Normal heart sounds.  Pulmonary:      Effort: Pulmonary effort is normal.     Breath sounds: Normal breath sounds.  Skin:    General: Skin is warm and dry.  Neurological:     General: No focal deficit present.     Mental Status: She is alert and oriented to person, place, and time.     Cranial Nerves: No cranial nerve deficit.     Sensory: No sensory deficit.     Motor: No weakness.     Gait: Gait abnormal (in wheelchair for exam).  Psychiatric:        Mood and Affect: Mood normal.        Behavior: Behavior normal.        Thought Content: Thought content normal.        Judgment: Judgment normal.      Assessment & Plan:  1. COPD with acute exacerbation [facility]) - Reviewed hospital notes, imaging, labs, and discharge instructions with patient. All questions answered to the best of my ability.  - Breathing back to baseline   2. Anxiety  and depression  - ALPRAZolam (XANAX) 0.5 MG tablet; Take 1 tablet (0.5 mg total) by mouth 3 (three) times daily as needed for anxiety.  Dispense: 90 tablet; Refill: 1  3. Essential hypertension - Controlled. No change   4. SAH (subarachnoid hemorrhage) (HCC) - butalbital-acetaminophen-caffeine (FIORICET) 50-325-40 MG tablet; Take 1 tablet by mouth every 6 (six) hours as needed for headache.  Dispense: 60 tablet; Refill: 0 - Continue with symptom management  - Refrain from nsaids or blood thinners  5. SDH (subdural hematoma) (HCC) - butalbital-acetaminophen-caffeine (FIORICET) 50-325-40 MG tablet; Take 1 tablet by mouth every 6 (six) hours as needed for headache.  Dispense: 60 tablet; Refill: 0 - Continue with symptom management - Refrain from nsaids or blood thinners  6. Syncope, unspecified syncope type - Follow up with cardiology as directed  7. Chronic diastolic CHF (congestive heart failure) ([place]) - Follow up with cardiology as directed  8. Vertigo - ondansetron (ZOFRAN) 4 MG tablet; Take 1 tablet (4 mg total) by mouth every 8 (eight) hours as needed for nausea or  -  Ambulatory referral to Physical Therapy  [name], NP

## 2021-09-06 ENCOUNTER — Telehealth: Payer: Self-pay | Admitting: *Deleted

## 2021-09-06 NOTE — Chronic Care Management (AMB) (Signed)
  Chronic Care Management Note  09/06/2021 Name: CRAIG WISNEWSKI MRN: [id number] DOB: February 10, 1956  GENEIVE SANDSTROM is a 66 y.o. year old female who is a primary care patient of Nafziger, Tommi Rumps, NP and is actively engaged with the care management team. I reached out to [name] by phone today to assist with scheduling an initial visit with the RN Case Manager  Follow up plan: Telephone appointment with care management team member scheduled for:09/10/21  [facility]  Direct Dial: [phone]

## 2021-09-10 ENCOUNTER — Ambulatory Visit: Payer: Medicare Other

## 2021-09-10 ENCOUNTER — Other Ambulatory Visit: Payer: Self-pay | Admitting: Adult Health

## 2021-09-10 DIAGNOSIS — J441 Chronic obstructive pulmonary disease with (acute) exacerbation: Secondary | ICD-10-CM

## 2021-09-10 DIAGNOSIS — I10 Essential (primary) hypertension: Secondary | ICD-10-CM

## 2021-09-10 DIAGNOSIS — R42 Dizziness and giddiness: Secondary | ICD-10-CM

## 2021-09-10 DIAGNOSIS — R2681 Unsteadiness on feet: Secondary | ICD-10-CM

## 2021-09-10 DIAGNOSIS — I5032 Chronic diastolic (congestive) heart failure: Secondary | ICD-10-CM

## 2021-09-10 NOTE — Chronic Care Management (AMB) (Signed)
Care Management    RN Visit Note  09/10/2021 Name: Kim [name] MRN: [id number] DOB: 03-25-1956  Subjective: Kim [name] is a 66 y.o. year old female who is a primary care patient of Kim Peng, NP. The care management team was consulted for assistance with disease management and care coordination needs.    Engaged with patient by telephone for initial visit in response to provider referral for case management and/or care coordination services.   Consent to Services:   Kim [name] was given information about Care Management services today including:  Care Management services includes personalized support from designated clinical staff supervised by her physician, including individualized plan of care and coordination with other care providers 24/7 contact phone numbers for assistance for urgent and routine care needs. The patient may stop case management services at any time by phone call to the office staff.  Patient agreed to services and consent obtained.   Assessment: Review of patient past medical history, allergies, medications, health status, including review of consultants reports, laboratory and other test data, was performed as part of comprehensive evaluation and provision of chronic care management services.   SDOH (Social Determinants of Health) assessments and interventions performed:  SDOH Interventions    Flowsheet Row Most Recent Value  SDOH Interventions   Food Insecurity Interventions Intervention Not Indicated  Financial Strain Interventions Intervention Not Indicated  Housing Interventions Intervention Not Indicated  Stress Interventions Intervention Not Indicated  Transportation Interventions Intervention Not Indicated  Depression Interventions/Treatment  Currently on Treatment        Care Plan  Allergies  Allergen Reactions   Tape Other (See Comments)    Skin tears easily, peels skin off   Daliresp [Roflumilast]     Skin peeling   Latex  Other (See Comments)    Peels skin off   Lisinopril Cough   Morphine And Related     hallucinations   Penicillins Hives and Other (See Comments)    Tolerates Omnicef Has patient had a PCN reaction causing immediate rash, facial/tongue/throat swelling, SOB or lightheadedness with hypotension: Yes Has patient had a PCN reaction causing severe rash involving mucus membranes or skin necrosis: No Has patient had a PCN reaction that required hospitalization No Has patient had a PCN reaction occurring within the last 10 years: No If all of the above answers are "NO", then may proceed with Cephalosporin use.    Omnicef [Cefdinir] Rash    Outpatient Encounter Medications as of 09/10/2021  Medication Sig Note   acetaminophen (TYLENOL) 500 MG tablet Take 500 mg by mouth 2 (two) times daily as needed for headache.    albuterol (VENTOLIN HFA) 108 (90 Base) MCG/ACT inhaler INHALE TWO PUFFS INTO LUNGS EVERY 4 TO 6 HOURS AS NEEDED FOR SHORTNESS OF BREATH/WHEEZING. (Patient taking differently: Inhale 2 puffs into the lungs See admin instructions. 2 puffs every 4-6 hours as needed for shortness of breath and wheezing.)    ALPRAZolam (XANAX) 0.5 MG tablet Take 1 tablet (0.5 mg total) by mouth 3 (three) times daily as needed for anxiety.    aspirin EC 81 MG EC tablet Take 1 tablet (81 mg total) by mouth daily.    azithromycin (ZITHROMAX) 250 MG tablet Take 1 tablet (250 mg total) by mouth daily.    butalbital-acetaminophen-caffeine (FIORICET) 50-325-40 MG tablet Take 1 tablet by mouth every 6 (six) hours as needed for headache.    Calcium Carb-Cholecalciferol (CALCIUM 600 + D PO) Take 600 mg by mouth daily. Calcium 600  mg + 4000 unit D3    cetirizine (ZYRTEC) 10 MG tablet Take 10 mg by mouth daily.    Cholecalciferol (VITAMIN D3 PO) Take 5,000 Units by mouth daily.    furosemide (LASIX) 40 MG tablet Take 1 tablet (40 mg total) by mouth daily as needed (for an overnight weight gain of 3-5 pounds of fluid).     Ginkgo Biloba 120 MG CAPS Take 120 mg by mouth daily.    HYDROcodone-acetaminophen (NORCO) 7.5-325 MG tablet Take 1 tablet by mouth every 6 (six) hours as needed for moderate pain.    ipratropium-albuterol (DUONEB) 0.5-2.5 (3) MG/3ML SOLN USE 3 ML VIA NEBULIZER EVERY 6 HOURS AS NEEDED.  DX: J44.9 (Patient taking differently: Take 3 mLs by nebulization. 3 ml nebulization five times a day as needed for shortness of breath)    lidocaine (LIDODERM) 5 % Place 1 patch onto the skin daily. Remove & Discard patch within 12 hours or as directed by MD    losartan (COZAAR) 25 MG tablet Take 1 tablet (25 mg total) by mouth daily. 08/26/2021: Pt stated she has both 25 mg and 50 mg at home currently. Pt reports she is taking 25 mg daily. Pt stated she is currently taking 25 mg tablets.    magnesium oxide (MAG-OX) 400 MG tablet Take 400 mg by mouth daily.    metoprolol succinate (TOPROL-XL) 25 MG 24 hr tablet Take 0.5 tablets (12.5 mg total) by mouth daily.    MUCINEX MAXIMUM STRENGTH 1200 MG TB12 Take 1,200 mg by mouth every 12 (twelve) hours.    nitroGLYCERIN (NITROSTAT) 0.4 MG SL tablet Place 1 tablet (0.4 mg total) under the tongue every 5 (five) minutes as needed for chest pain.    nystatin (MYCOSTATIN) 100000 UNIT/ML suspension Take 5 mLs by mouth 3 (three) times daily as needed (thrush).    Olopatadine HCl (PATADAY OP) Place 1 drop into the left eye daily.    ondansetron (ZOFRAN) 4 MG tablet Take 1 tablet (4 mg total) by mouth every 8 (eight) hours as needed for nausea or vomiting.    OXYGEN Inhale 2 L/min into the lungs continuous.    PARoxetine (PAXIL) 30 MG tablet Take 1 tablet (30 mg total) by mouth daily.    Potassium 99 MG TABS Take 99 mg by mouth daily.    predniSONE (DELTASONE) 10 MG tablet Take 1 tablet (10 mg total) by mouth daily.    Spacer/Aero Chamber Mouthpiece MISC 1 Device by Does not apply route as directed.    SYMBICORT 160-4.5 MCG/ACT inhaler INHALE 2 PUFFS TWICE DAILY (Patient taking  differently: 2 puffs 2 (two) times daily.)    Tiotropium Bromide Monohydrate (SPIRIVA RESPIMAT) 2.5 MCG/ACT AERS Inhale 2 puffs into the lungs daily. (Patient taking differently: Inhale 2 puffs into the lungs daily as needed (wheezing).) 08/26/2021: Pt was unaware of last dose.    No facility-administered encounter medications on file as of 09/10/2021.    Patient Active Problem List   Diagnosis Date Noted   COPD with acute exacerbation ([place]) 08/26/2021   Double vision 08/26/2021   AKI (acute kidney injury) ([place]) 08/26/2021   Postconcussive syndrome 08/26/2021   Elevated troponin 08/26/2021   Subarachnoid hemorrhage ([place]) 08/26/2021   Syncope and collapse 08/26/2021   Heart failure with preserved ejection fraction ([place]) 08/26/2021   Chronic respiratory failure with hypoxia and hypercapnia ([place]) 06/26/2021   NSTEMI (non-ST elevated myocardial infarction) ([place]) 11/17/2020   Osteoporosis 06/05/2020   Chronic diastolic CHF (congestive heart failure) ([place])  03/24/2020   Essential hypertension 03/24/2020   Vertebral fracture, osteoporotic ([place]) 03/05/2020   History of colonic polyps    Benign neoplasm of ascending colon    Neuritis 01/29/2017   Alpha-1-antitrypsin deficiency carrier 01/19/2017   Dependent edema 01/14/2017   Orthostatic hypotension    Allergic rhinitis 08/10/2015   Acute on chronic respiratory failure with hypoxia ([facility]) 07/05/2015   Hyperglycemia    HLD (hyperlipidemia)    Coronary artery calcification seen on CAT scan 07/02/2015   Solitary pulmonary nodule 12/02/2013   Loss of weight 11/10/2013   Osteoarthritis 06/05/2009   Brachial neuritis or radiculitis 06/05/2009   Dyslipidemia 03/12/2009   NEPHROLITHIASIS 01/05/2008   COPD GOLD III  08/31/2007   PARESTHESIA 08/24/2007   Anxiety and depression 11/19/2006    Conditions to be addressed/monitored: CHF, HTN, COPD, and hx of falls  Care Plan : RN Care Manager Plan of Care  Updates made by Dimitri Ped, RN  since 09/10/2021 12:00 AM     Problem: Disease Management and Care Coordination Needs(COPD, CHF, HTN, hx falls)   Priority: High     Long-Range Goal: Establish Plan of Care for Care Coordination and Disease Management Needs(COPD, CHF, HTN, hx falls)   Start Date: 09/10/2021  Expected End Date: 03/09/2022  Priority: High  Note:   Current Barriers:  Knowledge Deficits related to plan of care for management of CHF, HTN, COPD, and Hx of falls  Care Coordination needs related to ADL IADL limitations No Advanced Directives in place Pt states she still is having dizziness and she is waiting to hear from the physical therapy for her inner ear problem.  States her breathing has improved since she started using the BiPAP at night.  States she wears her O2 at 2L/min during the day.  States her O2 sat was 97% this morning.  States she weights daily and will take a Lasix if her weight goes up as she has been directed to do.  States she is unsteady getting up and she is using a borrowed walker that is too big.  States she needs a shower chair as she needs to sit down when taking a shower.  RNCM Clinical Goal(s):  Patient will verbalize understanding of plan for management of CHF, HTN, COPD, and Hx of falls  as evidenced by voiced adherence to plan of care verbalize basic understanding of  CHF, HTN, COPD, and Hx of falls  disease process and self health management plan as evidenced by voiced understanding and teach back  take all medications exactly as prescribed and will call provider for medication related questions as evidenced by dispense report and pt verbalization  attend all scheduled medical appointments: Cardiology 08/12/21, Kim Peng NP 10/15/21, Pulmonary 11/05/21 as evidenced by medical records demonstrate Improved adherence to prescribed treatment plan for CHF, HTN, COPD, and Hx of falls  as evidenced by readings within limits, voiced adherence to plan of care continue to work with RN Care Manager to  address care management and care coordination needs related to  CHF, HTN, COPD, and Hx of falls  as evidenced by adherence to CM Team Scheduled appointments not experience hospital admission as evidenced by review of EMR. Hospital Admissions in last 6 months = 2  through collaboration with RN Care manager, provider, and care team.   Interventions: 1:1 collaboration with primary care provider regarding development and update of comprehensive plan of care as evidenced by provider attestation and co-signature Inter-disciplinary care team collaboration (see longitudinal plan of care)  Evaluation of current treatment plan related to  self management and patient's adherence to plan as established by provider Communicated to provider need for walker and shower chair Mailed [place]  Calendar and Advanced Directives  packet   Heart Failure Interventions:  (Status:  New goal.) Long Term Goal Basic overview and discussion of pathophysiology of Heart Failure reviewed Provided education on low sodium diet Reviewed Heart Failure Action Plan in depth and provided written copy Assessed need for readable accurate scales in home Provided education about placing scale on hard, flat surface Advised patient to weigh each morning after emptying bladder Discussed importance of daily weight and advised patient to weigh and record daily Discussed the importance of keeping all appointments with provider  COPD Interventions:  (Status:  New goal.) Long Term Goal Provided patient with basic written and verbal COPD education on self care/management/and exacerbation prevention Provided instruction about proper use of medications used for management of COPD including inhalers Advised patient to self assesses COPD action plan zone and make appointment with provider if in the yellow zone for 48 hours without improvement Screening for signs and symptoms of depression related to chronic disease state     Falls Interventions:  (Status:  New goal.) Long Term Goal Provided written and verbal education re: potential causes of falls and Fall prevention strategies Reviewed medications and discussed potential side effects of medications such as dizziness and frequent urination Advised patient of importance of notifying provider of falls Assessed for signs and symptoms of orthostatic hypotension Given number for [facility] to call if they do not contact her about vestibular therapy   Hypertension Interventions:  (Status:  New goal.) Long Term Goal Last practice recorded BP readings:  BP Readings from Last 3 Encounters:  09/05/21 122/60  09/04/21 120/68  08/29/21 140/66  Most recent eGFR/CrCl:  Lab Results  Component Value Date   EGFR 70 12/21/2020    No components found for: CRCL  Evaluation of current treatment plan related to hypertension self management and patient's adherence to plan as established by provider Reviewed medications with patient and discussed importance of compliance Counseled on the importance of exercise goals with target of 150 minutes per week Discussed plans with patient for ongoing care management follow up and provided patient with direct contact information for care management team  Patient Goals/Self-Care Activities: Take all medications as prescribed Attend all scheduled provider appointments Call pharmacy for medication refills 3-7 days in advance of running out of medications Call provider office for new concerns or questions  call office if I gain more than 2 pounds in one day or 5 pounds in one week use salt in moderation watch for swelling in feet, ankles and legs every day weigh myself daily follow rescue plan if symptoms flare-up eat more whole grains, fruits and vegetables, lean meats and healthy fats identify and avoid work-related triggers identify and remove indoor air pollutants limit outdoor activity during cold  weather listen for public air quality announcements every day develop a rescue plan check blood pressure weekly write blood pressure results in a log or diary take blood pressure log to all doctor appointments call doctor for signs and symptoms of high blood pressure  Follow Up Plan:  Telephone follow up appointment with care management team member scheduled for:  10/03/21 The patient has been provided with contact information for the care management team and has been advised to call with any health related questions or concerns.  Plan: Telephone follow up appointment with care management team member scheduled for:  10/03/21 The patient has been provided with contact information for the care management team and has been advised to call with any health related questions or concerns.   Peter Garter RN, Jackquline Denmark, CDE Care Management Coordinator [facility] [phone]

## 2021-09-10 NOTE — Patient Instructions (Signed)
Visit Information  Thank you for taking time to visit with me today. Please don't hesitate to contact me if I can be of assistance to you before our next scheduled telephone appointment.  Following are the goals we discussed today:  Take all medications as prescribed Attend all scheduled provider appointments Call pharmacy for medication refills 3-7 days in advance of running out of medications Call provider office for new concerns or questions  call office if I gain more than 2 pounds in one day or 5 pounds in one week use salt in moderation watch for swelling in feet, ankles and legs every day weigh myself daily follow rescue plan if symptoms flare-up eat more whole grains, fruits and vegetables, lean meats and healthy fats identify and avoid work-related triggers identify and remove indoor air pollutants limit outdoor activity during cold weather listen for public air quality announcements every day develop a rescue plan check blood pressure weekly write blood pressure results in a log or diary take blood pressure log to all doctor appointments call doctor for signs and symptoms of high blood pressure COPD Action Plan A COPD action plan is a description of what to do when you have a flare (exacerbation) of chronic obstructive pulmonary disease (COPD). Your action plan is a color-coded plan that lists the symptoms that indicate whether your condition is under control and what actions to take. If you have symptoms in the green zone, it means you are doing well that day. If you have symptoms in the yellow zone, it means you are having a bad day or an exacerbation. If you have symptoms in the red zone, you need urgent medical care. Follow the plan that you and your health care provider developed. Review your plan with your health care provider at each visit. Red zone Symptoms in this zone mean that you should get medical help right away. They include: Feeling very short of breath, even  when you are resting. Not being able to do any activities because of poor breathing. Not being able to sleep because of poor breathing. Fever or shaking chills. Feeling confused or very sleepy. Chest pain. Coughing up blood. If you have any of these symptoms, call emergency services (911 in the [place] or go to the nearest emergency room. Yellow zone Symptoms in this zone mean that your condition may be getting worse. They include: Feeling more short of breath than usual. Having less energy for daily activities than usual. Phlegm or mucus that is thicker than usual. Needing to use your rescue inhaler or nebulizer more often than usual. More ankle swelling than usual. Coughing more than usual. Feeling like you have a chest cold. Trouble sleeping due to COPD symptoms. Decreased appetite. COPD medicines not helping as much as usual. If you experience any "yellow" symptoms: Keep taking your daily medicines as directed. Use your quick-relief inhaler as told by your health care provider. If you were prescribed steroid medicine to take by mouth (oral medicine), start taking it as told by your health care provider. If you were prescribed an antibiotic medicine, start taking it as told by your health care provider. Do not stop taking the antibiotic even if you start to feel better. Use oxygen as told by your health care provider. Get more rest. Do your pursed-lip breathing exercises. Do not smoke. Avoid any irritants in the air. If your signs and symptoms do not improve after taking these steps, call your health care provider right away. Green zone Symptoms in this  zone mean that you are doing well. They include: Being able to do your usual activities and exercise. Having the usual amount of coughing, including the same amount of phlegm or mucus. Being able to sleep well. Having a good appetite. Where to find more information: You can find more information about COPD from: American Lung  Association, My COPD Action Plan: www.lung.org COPD Foundation: www.copdfoundation.[place]: https://wilson-eaton.com/ Follow these instructions at home: Continue taking your daily medicines as told by your health care provider. Make sure you receive all the immunizations that your health care provider recommends, especially the pneumococcal and influenza vaccines. Wash your hands often with soap and water. Have family members wash their hands too. Regular hand washing can help prevent infections. Follow your usual exercise and diet plan. Avoid irritants in the air, such as smoke. Do not use any products that contain nicotine or tobacco. These products include cigarettes, chewing tobacco, and vaping devices, such as e-cigarettes. If you need help quitting, ask your health care provider. Summary A COPD action plan tells you what to do when you have a flare (exacerbation) of chronic obstructive pulmonary disease (COPD). Follow each action plan for your symptoms. If you have any symptoms in the red zone, call emergency services (911 in the [place] or go to the nearest emergency room. This information is not intended to replace advice given to you by your health care provider. Make sure you discuss any questions you have with your health care provider. Document Revised: 01/31/2020 Document Reviewed: 01/31/2020 Elsevier Patient Education  [facility] next appointment is by telephone on 10/03/21 at 2:15 PM  Please call the care guide team at [phone] if you need to cancel or reschedule your appointment.   If you are experiencing a Mental Health or [place] or need someone to talk to, please call the Suicide and Crisis Lifeline: 988 call the [place] National Suicide Prevention Lifeline: (610)809-5332 or TTY: 2765134060 TTY 681-406-1221) to talk to a trained counselor call 1-800-273-TALK (toll free, 24 hour hotline) go to [facility] [address] [place] [phone]) call 911   Following is a copy of your full plan of care:  Care Plan : RN Care Manager Plan of Care  Updates made by Dimitri Ped, RN since 09/10/2021 12:00 AM     Problem: Disease Management and Care Coordination Needs(COPD, CHF, HTN, hx falls)   Priority: High     Long-Range Goal: Establish Plan of Care for Care Coordination and Disease Management Needs(COPD, CHF, HTN, hx falls)   Start Date: 09/10/2021  Expected End Date: 03/09/2022  Priority: High  Note:   Current Barriers:  Knowledge Deficits related to plan of care for management of CHF, HTN, COPD, and Hx of falls  Care Coordination needs related to ADL IADL limitations No Advanced Directives in place Pt states she still is having dizziness and she is waiting to hear from the physical therapy for her inner ear problem.  States her breathing has improved since she started using the BiPAP at night.  States she wears her O2 at 2L/min during the day.  States her O2 sat was 97% this morning.  States she weights daily and will take a Lasix if her weight goes up as she has been directed to do.  States she is unsteady getting up and she is using a borrowed walker that is too big.  States she needs a shower chair as  she needs to sit down when taking a shower.  RNCM Clinical Goal(s):  Patient will verbalize understanding of plan for management of CHF, HTN, COPD, and Hx of falls  as evidenced by voiced adherence to plan of care verbalize basic understanding of  CHF, HTN, COPD, and Hx of falls  disease process and self health management plan as evidenced by voiced understanding and teach back  take all medications exactly as prescribed and will call provider for medication related questions as evidenced by dispense report and pt verbalization  attend all scheduled medical appointments: Cardiology 08/12/21, [name] NP 10/15/21, Pulmonary 11/05/21 as evidenced by medical  records demonstrate Improved adherence to prescribed treatment plan for CHF, HTN, COPD, and Hx of falls  as evidenced by readings within limits, voiced adherence to plan of care continue to work with RN Care Manager to address care management and care coordination needs related to  CHF, HTN, COPD, and Hx of falls  as evidenced by adherence to CM Team Scheduled appointments not experience hospital admission as evidenced by review of EMR. Hospital Admissions in last 6 months = 2  through collaboration with RN Care manager, provider, and care team.   Interventions: 1:1 collaboration with primary care provider regarding development and update of comprehensive plan of care as evidenced by provider attestation and co-signature Inter-disciplinary care team collaboration (see longitudinal plan of care) Evaluation of current treatment plan related to  self management and patient's adherence to plan as established by provider Communicated to provider need for walker and shower chair Mailed [place]  Calendar and Advanced Directives  packet   Heart Failure Interventions:  (Status:  New goal.) Long Term Goal Basic overview and discussion of pathophysiology of Heart Failure reviewed Provided education on low sodium diet Reviewed Heart Failure Action Plan in depth and provided written copy Assessed need for readable accurate scales in home Provided education about placing scale on hard, flat surface Advised patient to weigh each morning after emptying bladder Discussed importance of daily weight and advised patient to weigh and record daily Discussed the importance of keeping all appointments with provider  COPD Interventions:  (Status:  New goal.) Long Term Goal Provided patient with basic written and verbal COPD education on self care/management/and exacerbation prevention Provided instruction about proper use of medications used for management of COPD including inhalers Advised patient  to self assesses COPD action plan zone and make appointment with provider if in the yellow zone for 48 hours without improvement Screening for signs and symptoms of depression related to chronic disease state    Falls Interventions:  (Status:  New goal.) Long Term Goal Provided written and verbal education re: potential causes of falls and Fall prevention strategies Reviewed medications and discussed potential side effects of medications such as dizziness and frequent urination Advised patient of importance of notifying provider of falls Assessed for signs and symptoms of orthostatic hypotension Given number for [facility] to call if they do not contact her about vestibular therapy   Hypertension Interventions:  (Status:  New goal.) Long Term Goal Last practice recorded BP readings:  BP Readings from Last 3 Encounters:  09/05/21 122/60  09/04/21 120/68  08/29/21 140/66  Most recent eGFR/CrCl:  Lab Results  Component Value Date   EGFR 70 12/21/2020    No components found for: CRCL  Evaluation of current treatment plan related to hypertension self management and patient's adherence to plan as established by provider Reviewed medications with patient and discussed importance  of compliance Counseled on the importance of exercise goals with target of 150 minutes per week Discussed plans with patient for ongoing care management follow up and provided patient with direct contact information for care management team  Patient Goals/Self-Care Activities: Take all medications as prescribed Attend all scheduled provider appointments Call pharmacy for medication refills 3-7 days in advance of running out of medications Call provider office for new concerns or questions  call office if I gain more than 2 pounds in one day or 5 pounds in one week use salt in moderation watch for swelling in feet, ankles and legs every day weigh myself daily follow rescue plan if symptoms  flare-up eat more whole grains, fruits and vegetables, lean meats and healthy fats identify and avoid work-related triggers identify and remove indoor air pollutants limit outdoor activity during cold weather listen for public air quality announcements every day develop a rescue plan check blood pressure weekly write blood pressure results in a log or diary take blood pressure log to all doctor appointments call doctor for signs and symptoms of high blood pressure  Follow Up Plan:  Telephone follow up appointment with care management team member scheduled for:  10/03/21 The patient has been provided with contact information for the care management team and has been advised to call with any health related questions or concerns.       Ms. Doria was given information about Care Management services by the embedded care coordination team including:  Care Management services include personalized support from designated clinical staff supervised by her physician, including individualized plan of care and coordination with other care providers 24/7 contact phone numbers for assistance for urgent and routine care needs. The patient may stop CCM services at any time (effective at the end of the month) by phone call to the office staff.  Patient agreed to services and verbal consent obtained.   Patient verbalizes understanding of instructions and care plan provided today and agrees to view in [place]. Active MyChart status and patient understanding of how to access instructions and care plan via MyChart confirmed with patient.     Telephone follow up appointment with care management team member scheduled for: 10/03/21 2:15 PM  Peter Garter RN, Jackquline Denmark, CDE Care Management Coordinator [facility] [phone]

## 2021-09-12 ENCOUNTER — Ambulatory Visit (INDEPENDENT_AMBULATORY_CARE_PROVIDER_SITE_OTHER): Payer: Medicare Other | Admitting: Nurse Practitioner

## 2021-09-12 ENCOUNTER — Encounter: Payer: Self-pay | Admitting: Nurse Practitioner

## 2021-09-12 VITALS — BP 98/58 | HR 86 | Ht 61.75 in | Wt 141.0 lb

## 2021-09-12 DIAGNOSIS — E785 Hyperlipidemia, unspecified: Secondary | ICD-10-CM | POA: Diagnosis not present

## 2021-09-12 DIAGNOSIS — R55 Syncope and collapse: Secondary | ICD-10-CM

## 2021-09-12 DIAGNOSIS — R42 Dizziness and giddiness: Secondary | ICD-10-CM

## 2021-09-12 DIAGNOSIS — I5020 Unspecified systolic (congestive) heart failure: Secondary | ICD-10-CM

## 2021-09-12 MED ORDER — POTASSIUM CHLORIDE ER 10 MEQ PO TBCR: 10.0000 meq | EXTENDED_RELEASE_TABLET | ORAL | 11 refills | Status: DC | PRN

## 2021-09-12 NOTE — Patient Instructions (Addendum)
Medication Instructions:  Your physician has recommended you make the following change in your medication:  1-TAKE Potassium 10 meq by mouth with your lasix when needed.  *If you need a refill on your cardiac medications before your next appointment, please call your pharmacy*  Lab Work: If you have labs (blood work) drawn today and your tests are completely normal, you will receive your results only by: [place] (if you have MyChart) OR A paper copy in the mail If you have any lab test that is abnormal or we need to change your treatment, we will call you to review the results.  Test: Your physician has requested that you have a carotid duplex. This test is an ultrasound of the carotid arteries in your neck. It looks at blood flow through these arteries that supply the brain with blood. Allow one hour for this exam. There are no restrictions or special instructions.  Follow-Up: At [facility], you and your health needs are our priority.  As part of our continuing mission to provide you with exceptional heart care, we have created designated Provider Care Teams.  These Care Teams include your primary Cardiologist (physician) and Advanced Practice Providers (APPs -  Physician Assistants and Nurse Practitioners) who all work together to provide you with the care you need, when you need it.  We recommend signing up for the patient portal called "MyChart".  Sign up information is provided on this After Visit Summary.  MyChart is used to connect with patients for Virtual Visits (Telemedicine).  Patients are able to view lab/test results, encounter notes, upcoming appointments, etc.  Non-urgent messages can be sent to your provider as well.   To learn more about what you can do with MyChart, go to [email or web address].    Your next appointment:   3 month(s)  The format for your next appointment:   In Person  Provider:   Christen Bame, NP or  Lauree Chandler, MD will plan to  see you again in 3 month(s).    Other Instructions Heart Failure Education: Weigh yourself EVERY morning after you go to the bathroom but before you eat or drink anything. Write this number down in a weight log/diary. If you gain 3 pounds overnight or 5 pounds in a week, call the office. Take your medicines as prescribed. If you have concerns about your medications, please call us before you stop taking them.  Eat low salt foods--Limit salt (sodium) to 2000 mg per day. This will help prevent your body from holding onto fluid. Read food labels as many processed foods have a lot of sodium, especially canned goods and prepackaged meats. If you would like some assistance choosing low sodium foods, we would be happy to set you up with a nutritionist. Stay as active as you can everyday. Staying active will give you more energy and make your muscles stronger. Start with 5 minutes at a time and work your way up to 30 minutes a day. Break up your activities--do some in the morning and some in the afternoon. Start with 3 days per week and work your way up to 5 days as you can.  If you have chest pain, feel short of breath, dizzy, or lightheaded, STOP. If you don't feel better after a short rest, call 911. If you do feel better, call the office to let us know you have symptoms with exercise. Limit all fluids for the day to less than 2 liters. Fluid includes all drinks, coffee, juice, ice chips,  soup, jello, and all other liquids.   Important Information About Sugar

## 2021-09-12 NOTE — Progress Notes (Signed)
Cardiology Office Note:    Date:  09/13/2021   ID:  [name], DOB 1956/01/19, MRN [id number]  PCP:  [name], NP   [facility] Providers Cardiologist:  Lauree Chandler, MD     Referring MD: [name], NP   Chief Complaint: hospital follow-up HFrEF  History of Present Illness:    Kim [name] is a very pleasant 66 y.o. female with a hx of   Coronary artery disease  Cath in 2017: min non-obs CAD Cath 8/22: no CAD  HFimpEF (heart failure with improved ejection fraction)  Non-ischemic cardiomyopathy  Cath 3/17: EF 20-25 Echocardiogram 5/18: normal EF Echocardiogram 4/21: Normal EF  Tako-tsubo CM Admx 8/22 >> +Trop, Echo: EF 30-35, apical WMA // no CAD on cath Echocardiogram 9/22: EF 55-60  COPD, home O2 Hypertension  Hyperlipidemia  Ex-smoker    She had a history of syncope December 2022.  She was walking and woke up on the floor, work-up reassuring by neurology.  She reported dizzy spell and blurry eye after cataract surgery and was being followed by neurology for this.  Normal EEG and MRI of brain.  Found to be orthostatic with SBP dropping 30 points.  No medication adjustment was done.  3 days prior to admission she had a loss of consciousness.  Was folding laundry and then reach to get a banana on the countertop.  Next thing she remembers is waking up on the floor, no prodromal symptoms  Admission 5/22-5/25/23 following syncope and fall. Was found to have COPD exacerbation, subdural hematoma.  Evaluated by neurosurgery with recommendation for nonsurgical management.  Telemetry was negative during hospitalization. Respiratory status significantly improved during hospitalization. Elevated troponin felt to be in the setting of panic attack/newly identified reduced LVEF. No chest pain, cath 11/2020 without evidence of obstructive CAD.   Today, she presents in a wheelchair accompanied by her husband. She reports feeling better since hospital d/c with some  residual dizziness and headaches. Awaiting home therapy to call. Has not been very active since returning home. She denies chest pain, shortness of breath, lower extremity edema, fatigue, palpitations, melena, hematuria, hemoptysis, diaphoresis, presyncope, syncope, orthopnea, and PND.  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   OSTEOARTHRITIS 06/05/2009   PARESTHESIA 08/24/2007   TOBACCO ABUSE 01/25/2010    Past Surgical History:  Procedure Laterality Date   ABDOMINAL HYSTERECTOMY     APPENDECTOMY  1969   CARDIAC CATHETERIZATION N/A 07/05/2015   Procedure: Left Heart Cath and Coronary Angiography;  Surgeon: Leonie Man, MD;  Location: [place] [facility];  Service: Cardiovascular;  Laterality: N/A;   CHOLECYSTECTOMY  1989   collapse lung  1990   COLONOSCOPY WITH PROPOFOL N/A 05/14/2018   Procedure: COLONOSCOPY WITH PROPOFOL;  Surgeon: Irene Shipper, MD;  Location: [facility];  Service: Endoscopy;  Laterality: N/A;   EXCISION MASS ABDOMINAL  2019   EXPLORATORY LAPAROTOMY  1969   LEFT HEART CATH AND CORONARY ANGIOGRAPHY N/A 11/19/2020   Procedure: LEFT HEART CATH AND CORONARY ANGIOGRAPHY;  Surgeon: [name], MD;  Location: [place] [facility];  Service: Cardiovascular;  Laterality: N/A;   POLYPECTOMY  05/14/2018   Procedure: POLYPECTOMY;  Surgeon: Irene Shipper, MD;  Location: [facility];  Service: Endoscopy;;   SALPINGOOPHORECTOMY  2019    Current Medications: Current Meds  Medication Sig  acetaminophen (TYLENOL) 500 MG tablet Take 500 mg by mouth 2 (two) times daily as needed for headache.   albuterol (VENTOLIN HFA) 108 (90 Base) MCG/ACT inhaler INHALE TWO PUFFS INTO LUNGS EVERY 4 TO 6 HOURS AS NEEDED FOR SHORTNESS OF BREATH/WHEEZING.   ALPRAZolam (XANAX) 0.5 MG tablet Take 1 tablet (0.5 mg total) by mouth 3 (three) times daily  as needed for anxiety.   aspirin EC 81 MG EC tablet Take 1 tablet (81 mg total) by mouth daily.   azithromycin (ZITHROMAX) 250 MG tablet Take 1 tablet (250 mg total) by mouth daily.   butalbital-acetaminophen-caffeine (FIORICET) 50-325-40 MG tablet Take 1 tablet by mouth every 6 (six) hours as needed for headache.   Calcium Carb-Cholecalciferol (CALCIUM 600 + D PO) Take 600 mg by mouth daily. Calcium 600 mg + 4000 unit D3   cetirizine (ZYRTEC) 10 MG tablet Take 10 mg by mouth daily.   Cholecalciferol (VITAMIN D3 PO) Take 5,000 Units by mouth daily.   furosemide (LASIX) 40 MG tablet Take 1 tablet (40 mg total) by mouth daily as needed (for an overnight weight gain of 3-5 pounds of fluid).   Ginkgo Biloba Extract 60 MG CAPS Take 1 capsule by mouth daily.   HYDROcodone-acetaminophen (NORCO) 7.5-325 MG tablet Take 1 tablet by mouth every 6 (six) hours as needed for moderate pain.   ipratropium-albuterol (DUONEB) 0.5-2.5 (3) MG/3ML SOLN USE 3 ML VIA NEBULIZER EVERY 6 HOURS AS NEEDED.  DX: J44.9 (Patient taking differently: Take 3 mLs by nebulization. 3 ml nebulization five times a day as needed for shortness of breath)   lidocaine (LIDODERM) 5 % Place 1 patch onto the skin daily. Remove & Discard patch within 12 hours or as directed by MD   losartan (COZAAR) 25 MG tablet Take 1 tablet (25 mg total) by mouth daily.   magnesium oxide (MAG-OX) 400 MG tablet Take 400 mg by mouth daily.   metoprolol succinate (TOPROL-XL) 25 MG 24 hr tablet Take 0.5 tablets (12.5 mg total) by mouth daily.   MUCINEX MAXIMUM STRENGTH 1200 MG TB12 Take 1,200 mg by mouth every 12 (twelve) hours.   nitroGLYCERIN (NITROSTAT) 0.4 MG SL tablet Place 1 tablet (0.4 mg total) under the tongue every 5 (five) minutes as needed for chest pain.   nystatin (MYCOSTATIN) 100000 UNIT/ML suspension Take 5 mLs by mouth 3 (three) times daily as needed (thrush).   Olopatadine HCl (PATADAY OP) Place 1 drop into the left eye daily.   ondansetron  (ZOFRAN) 4 MG tablet Take 1 tablet (4 mg total) by mouth every 8 (eight) hours as needed for nausea or vomiting.   OXYGEN Inhale 2 L/min into the lungs continuous.   PARoxetine (PAXIL) 30 MG tablet Take 1 tablet (30 mg total) by mouth daily.   Potassium 99 MG TABS Take 99 mg by mouth daily.   potassium chloride (KLOR-CON) 10 MEQ tablet Take 1 tablet (10 mEq total) by mouth as needed (take with furosemide (lasix)).   predniSONE (DELTASONE) 10 MG tablet Take 1 tablet (10 mg total) by mouth daily.   Spacer/Aero Chamber Mouthpiece MISC 1 Device by Does not apply route as directed.   SYMBICORT 160-4.5 MCG/ACT inhaler INHALE 2 PUFFS TWICE DAILY (Patient taking differently: 2 puffs 2 (two) times daily.)   Tiotropium Bromide Monohydrate (SPIRIVA RESPIMAT) 2.5 MCG/ACT AERS Inhale 2 puffs into the lungs daily. (Patient taking differently: Inhale 2 puffs into the lungs daily as needed (wheezing).)   [DISCONTINUED] Ginkgo Biloba 120 MG CAPS Take 120  mg by mouth daily.     Allergies:   Tape, Daliresp [roflumilast], Latex, Lisinopril, Morphine and related, Penicillins, and Omnicef [cefdinir]   Social History   Socioeconomic History   Marital status: Married    Spouse name: [name]   Number of children: 2   Years of education: Not on file   Highest education level: Not on file  Occupational History   Occupation: Patient care aide-sits with elderly lady  Tobacco Use   Smoking status: Former    Packs/day: 2.00    Years: 38.00    Total pack years: 76.00    Types: Cigarettes    Quit date: 03/07/2014    Years since quitting: 7.5   Smokeless tobacco: Never  Vaping Use   Vaping Use: Never used  Substance and Sexual Activity   Alcohol use: No    Alcohol/week: 0.0 standard drinks of alcohol   Drug use: No   Sexual activity: Not on file  Other Topics Concern   Not on file  Social History Narrative   [facility] (01/12/17):   Originally from [facility]. Has lived in [place] for 17 years. Married. Has 2 dogs  currently. No mold and only 1 indoor plant. Had her home professionally cleaned a month ago. No bird or hot tub exposure. 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    Right handed    Social Determinants of Health   Financial Resource Strain: High Risk (09/10/2021)   Overall Financial Resource Strain (CARDIA)    Difficulty of Paying Living Expenses: Very hard  Food Insecurity: No Food Insecurity (09/10/2021)   Hunger Vital Sign    Worried About Running Out of Food in the Last Year: Never true    Ran Out of Food in the Last Year: Never true  Transportation Needs: No Transportation Needs (09/10/2021)   PRAPARE - Hydrologist (Medical): No    Lack of Transportation (Non-Medical): No  Physical Activity: Inactive (03/25/2021)   Exercise Vital Sign    Days of Exercise per Week: 0 days    Minutes of Exercise per Session: 0 min  Stress: No Stress Concern Present (09/10/2021)   [place]    Feeling of Stress : Not at all  Social Connections: Moderately Isolated (03/25/2021)   Social Connection and Isolation Panel [NHANES]    Frequency of Communication with Friends and Family: More than three times a week    Frequency of Social Gatherings with Friends and Family: More than three times a week    Attends Religious Services: Never    Marine scientist or Organizations: No    Attends Music therapist: Never    Marital Status: Married     Family History: The patient's family history includes Alcohol abuse in an other family member; Arthritis in an other family member; Cancer in her mother and another family member; Diabetes in her sister and another family member; Heart attack in her maternal uncle, mother, and other family members; Heart disease in an other family member; Hypertension in her brother and sister; Kidney disease in an other family member; Melanoma in  an other family member; Multiple sclerosis in her brother and another family member; Osteoporosis in an other family member; Rheum arthritis in her sister; Stroke in her maternal grandmother and another family member; Thyroid disease in her sister. There is no history of Colon cancer.  ROS:   Please see  the history of present illness.    + dizziness + headache All other systems reviewed and are negative.  Labs/Other Studies Reviewed:    The following studies were reviewed today:  Prior CV studies: Echocardiogram 08/26/21 EF 35-40%, HK all mild LV segments slightly worse in mid septal LV segments, apical LV segments appear hyperkinetic, G1DD, mild AI  Echocardiogram 12/19/20 EF 55-60, no RWMA, normal diastolic function, normal RVSF, trivial MR   LEFT HEART CATH AND CORONARY ANGIOGRAPHY 11/19/2020 Narrative Conclusions: Mild plaquing of the proximal LAD with minimal luminal narrowing.  Otherwise, no angiographically significant coronary artery disease.  Findings are similar to prior catheterization in 2017 and presumably due to stress-induced cardiomyopathy.   Recommendations: Medical therapy and risk factor modification to prevent progression of coronary artery disease. Escalate goal-directed medical therapy for treatment of non-ischemic cardiomyopathy (presumably stress-induced).    Echocardiogram 11/17/20 Akinesis of the entire apex, EF 30-35, GR 1 DD, GLS -7.9, normal RVSF   NON-TELEMETRY MONITORING HOOKUP AND INTERP 07/25/2015 Narrative Sinus rhythm with sinus bradycardia. Frequent premature atrial contractions (PACs) with pattern of trigeminy. Rare premature ventricular contractions (PVCs) No evidence of atrial fibrillation. No evidence of ventricular tachycardia    Recent Labs: 11/16/2020: ALT 15 08/26/2021: B Natriuretic Peptide 96.4; TSH 0.373 08/28/2021: BUN 25; Creatinine, Ser 1.12; Hemoglobin 10.1; Magnesium 2.1; Platelets 267; Potassium 4.1; Sodium 141  Recent Lipid  Panel    Component Value Date/Time   CHOL 263 (H) 01/09/2021 0945   CHOL 221 (H) 01/25/2018 0811   TRIG 228.0 (H) 01/09/2021 0945   HDL 45.70 01/09/2021 0945   HDL 41 01/25/2018 0811   CHOLHDL 6 01/09/2021 0945   VLDL 45.6 (H) 01/09/2021 0945   LDLCALC 117 (H) 10/18/2020 0836   LDLCALC 151 (H) 01/25/2018 0811   LDLDIRECT 182.0 01/09/2021 0945     Risk Assessment/Calculations:     Physical Exam:    VS:  BP (!) 98/58   Pulse 86   Ht 5' 1.75" (1.568 m)   Wt 141 lb (64 kg)   SpO2 95%   BMI 26.00 kg/m     Wt Readings from Last 3 Encounters:  09/12/21 141 lb (64 kg)  09/05/21 142 lb (64.4 kg)  09/04/21 139 lb 9.6 oz (63.3 kg)     GEN:  Well nourished, well developed in no acute distress HEENT: Normal NECK: No JVD; No carotid bruits CARDIAC: RRR, no murmurs, rubs, gallops RESPIRATORY:  Diminished throughout without rales, wheezing or rhonchi  ABDOMEN: Soft, non-tender, non-distended MUSCULOSKELETAL:  No edema; No deformity. 2+ pedal pulses, equal bilaterally SKIN: Warm and dry NEUROLOGIC:  Alert and oriented x 3 PSYCHIATRIC:  Normal affect   EKG:  EKG is not ordered today.    Diagnoses:    1. Hyperlipidemia, unspecified hyperlipidemia type   2. Syncope and collapse   3. Dizziness   4. HFrEF (heart failure with reduced ejection fraction) (HCC)    Assessment and Plan:     Dizziness: Primary symptom is dizziness.  This occurred prior to [facility].  Felt better with therapy in the hospital for vertigo.  We will get carotid u/s to r/o carotid occlusion that may be contributing.    Acute on chronic HFrEF: LVEF 35-40%, G1 DD by echo 08/26/21.  Hypokinesis of LV findings felt to be consistent with atypical stress cardiomyopathy. Prior history of reduced EF 11/2020 with improvement 12/2020. She is feeling well since hospital discharge. Denies dyspnea, edema, orthopnea, PND.  Admits to sodium indiscretion on 6/7.  Encouraged her  to weigh daily and notify us if she gains more than 2  pounds in 24 hours or 5 pounds in 1 week.  Encouraged low-sodium, heart healthy mostly plant-based diet.  Fluid restriction 2 L daily.  GDMT limited by hypotension.  Advised her to monitor BP and notify us if SBP consistently 120 or >.  Continue furosemide, losartan, metoprolol. Would favor addition of spironolactone 12.5 mg daily if SBP increases to 120 consistently.   Syncope: No further episodes since hospital discharge.  History of orthostatic hypotension. Has cardiac monitor but has not put it on, requests help. We were able to get to apply monitor for her.  Will await monitor results.  BP is low today.  Advised her to monitor at home and notify us with concerns.  Hypertension: BP is soft. Advised her to monitor on a consistent basis for further up-titration of GDMT.    Disposition: 3 months with Dr. Angelena Form or APP     Medication Adjustments/Labs and Tests Ordered: Current medicines are reviewed at length with the patient today.  Concerns regarding medicines are outlined above.  Orders Placed This Encounter  Procedures   VAS US CAROTID   Meds ordered this encounter  Medications   potassium chloride (KLOR-CON) 10 MEQ tablet    Sig: Take 1 tablet (10 mEq total) by mouth as needed (take with furosemide (lasix)).    Dispense:  30 tablet    Refill:  11    Patient Instructions  Medication Instructions:  Your physician has recommended you make the following change in your medication:  1-TAKE Potassium 10 meq by mouth with your lasix when needed.  *If you need a refill on your cardiac medications before your next appointment, please call your pharmacy*  Lab Work: If you have labs (blood work) drawn today and your tests are completely normal, you will receive your results only by: [place] (if you have MyChart) OR A paper copy in the mail If you have any lab test that is abnormal or we need to change your treatment, we will call you to review the results.  Test: Your  physician has requested that you have a carotid duplex. This test is an ultrasound of the carotid arteries in your neck. It looks at blood flow through these arteries that supply the brain with blood. Allow one hour for this exam. There are no restrictions or special instructions.  Follow-Up: At [facility], you and your health needs are our priority.  As part of our continuing mission to provide you with exceptional heart care, we have created designated Provider Care Teams.  These Care Teams include your primary Cardiologist (physician) and Advanced Practice Providers (APPs -  Physician Assistants and Nurse Practitioners) who all work together to provide you with the care you need, when you need it.  We recommend signing up for the patient portal called "MyChart".  Sign up information is provided on this After Visit Summary.  MyChart is used to connect with patients for Virtual Visits (Telemedicine).  Patients are able to view lab/test results, encounter notes, upcoming appointments, etc.  Non-urgent messages can be sent to your provider as well.   To learn more about what you can do with MyChart, go to [email or web address].    Your next appointment:   3 month(s)  The format for your next appointment:   In Person  Provider:   Christen Bame, NP or  Lauree Chandler, MD will plan to see you again in 3 month(s).  Other Instructions Heart Failure Education: Weigh yourself EVERY morning after you go to the bathroom but before you eat or drink anything. Write this number down in a weight log/diary. If you gain 3 pounds overnight or 5 pounds in a week, call the office. Take your medicines as prescribed. If you have concerns about your medications, please call us before you stop taking them.  Eat low salt foods--Limit salt (sodium) to 2000 mg per day. This will help prevent your body from holding onto fluid. Read food labels as many processed foods have a lot of sodium, especially  canned goods and prepackaged meats. If you would like some assistance choosing low sodium foods, we would be happy to set you up with a nutritionist. Stay as active as you can everyday. Staying active will give you more energy and make your muscles stronger. Start with 5 minutes at a time and work your way up to 30 minutes a day. Break up your activities--do some in the morning and some in the afternoon. Start with 3 days per week and work your way up to 5 days as you can.  If you have chest pain, feel short of breath, dizzy, or lightheaded, STOP. If you don't feel better after a short rest, call 911. If you do feel better, call the office to let us know you have symptoms with exercise. Limit all fluids for the day to less than 2 liters. Fluid includes all drinks, coffee, juice, ice chips, soup, jello, and all other liquids.   Important Information About Sugar         Signed, Kim Life, NP  09/13/2021 1:06 PM    [facility]

## 2021-09-13 ENCOUNTER — Encounter: Payer: Self-pay | Admitting: Nurse Practitioner

## 2021-09-16 ENCOUNTER — Ambulatory Visit (INDEPENDENT_AMBULATORY_CARE_PROVIDER_SITE_OTHER): Payer: Medicare Other

## 2021-09-16 VITALS — BP 109/69 | HR 72 | Temp 97.9°F | Resp 20 | Ht 61.75 in | Wt 142.8 lb

## 2021-09-16 DIAGNOSIS — M810 Age-related osteoporosis without current pathological fracture: Secondary | ICD-10-CM

## 2021-09-16 DIAGNOSIS — M8008XS Age-related osteoporosis with current pathological fracture, vertebra(e), sequela: Secondary | ICD-10-CM

## 2021-09-16 MED ORDER — ACETAMINOPHEN 325 MG PO TABS
650.0000 mg | ORAL_TABLET | Freq: Once | ORAL | Status: AC
  Administered 2021-09-16: 650 mg via ORAL
  Filled 2021-09-16: qty 2

## 2021-09-16 MED ORDER — DIPHENHYDRAMINE HCL 25 MG PO CAPS
25.0000 mg | ORAL_CAPSULE | Freq: Once | ORAL | Status: AC
  Administered 2021-09-16: 25 mg via ORAL
  Filled 2021-09-16: qty 1

## 2021-09-16 MED ORDER — ZOLEDRONIC ACID 5 MG/100ML IV SOLN
5.0000 mg | Freq: Once | INTRAVENOUS | Status: AC
  Administered 2021-09-16: 5 mg via INTRAVENOUS
  Filled 2021-09-16: qty 100

## 2021-09-16 MED ORDER — SODIUM CHLORIDE 0.9 % IV SOLN: INTRAVENOUS | Status: DC

## 2021-09-16 NOTE — Progress Notes (Signed)
Diagnosis: osteopenia  Provider:  Marshell Garfinkel, MD  Procedure: Infusion  IV Type: Peripheral, IV Location: L Antecubital  Reclast (Zolendronic Acid), Dose: 5 mg  Infusion Start Time: 2194  Infusion Stop Time: 7125  Post Infusion IV Care: Observation period completed and Peripheral IV Discontinued  Discharge: Condition: Good, Destination: Home . AVS provided to patient.   Performed by:  Arnoldo Morale, RN

## 2021-09-17 ENCOUNTER — Telehealth: Payer: Self-pay | Admitting: Pulmonary Disease

## 2021-09-17 ENCOUNTER — Encounter (HOSPITAL_COMMUNITY): Payer: Self-pay | Admitting: Internal Medicine

## 2021-09-17 ENCOUNTER — Emergency Department (HOSPITAL_COMMUNITY): Payer: Medicare Other

## 2021-09-17 ENCOUNTER — Inpatient Hospital Stay (HOSPITAL_COMMUNITY): Payer: Medicare Other

## 2021-09-17 ENCOUNTER — Other Ambulatory Visit: Payer: Self-pay

## 2021-09-17 ENCOUNTER — Inpatient Hospital Stay (HOSPITAL_COMMUNITY)
Admission: EM | Admit: 2021-09-17 | Discharge: 2021-09-18 | DRG: 190 | Disposition: A | Discharge status: Home / Self Care | Payer: Medicare Other | Attending: Internal Medicine | Admitting: Internal Medicine

## 2021-09-17 DIAGNOSIS — Z8249 Family history of ischemic heart disease and other diseases of the circulatory system: Secondary | ICD-10-CM | POA: Diagnosis not present

## 2021-09-17 DIAGNOSIS — Z888 Allergy status to other drugs, medicaments and biological substances status: Secondary | ICD-10-CM | POA: Diagnosis not present

## 2021-09-17 DIAGNOSIS — Z87891 Personal history of nicotine dependence: Secondary | ICD-10-CM | POA: Diagnosis not present

## 2021-09-17 DIAGNOSIS — E785 Hyperlipidemia, unspecified: Secondary | ICD-10-CM | POA: Diagnosis present

## 2021-09-17 DIAGNOSIS — J9601 Acute respiratory failure with hypoxia: Principal | ICD-10-CM

## 2021-09-17 DIAGNOSIS — M810 Age-related osteoporosis without current pathological fracture: Secondary | ICD-10-CM | POA: Diagnosis present

## 2021-09-17 DIAGNOSIS — I110 Hypertensive heart disease with heart failure: Secondary | ICD-10-CM | POA: Diagnosis present

## 2021-09-17 DIAGNOSIS — Z7951 Long term (current) use of inhaled steroids: Secondary | ICD-10-CM

## 2021-09-17 DIAGNOSIS — Z20822 Contact with and (suspected) exposure to covid-19: Secondary | ICD-10-CM | POA: Diagnosis present

## 2021-09-17 DIAGNOSIS — R7989 Other specified abnormal findings of blood chemistry: Secondary | ICD-10-CM | POA: Diagnosis not present

## 2021-09-17 DIAGNOSIS — J9611 Chronic respiratory failure with hypoxia: Secondary | ICD-10-CM

## 2021-09-17 DIAGNOSIS — R778 Other specified abnormalities of plasma proteins: Secondary | ICD-10-CM | POA: Diagnosis present

## 2021-09-17 DIAGNOSIS — J9621 Acute and chronic respiratory failure with hypoxia: Secondary | ICD-10-CM | POA: Diagnosis present

## 2021-09-17 DIAGNOSIS — F32A Depression, unspecified: Secondary | ICD-10-CM | POA: Diagnosis present

## 2021-09-17 DIAGNOSIS — Z91048 Other nonmedicinal substance allergy status: Secondary | ICD-10-CM

## 2021-09-17 DIAGNOSIS — Z79899 Other long term (current) drug therapy: Secondary | ICD-10-CM | POA: Diagnosis not present

## 2021-09-17 DIAGNOSIS — Z885 Allergy status to narcotic agent status: Secondary | ICD-10-CM | POA: Diagnosis not present

## 2021-09-17 DIAGNOSIS — Z91040 Latex allergy status: Secondary | ICD-10-CM

## 2021-09-17 DIAGNOSIS — Z66 Do not resuscitate: Secondary | ICD-10-CM | POA: Diagnosis present

## 2021-09-17 DIAGNOSIS — I609 Nontraumatic subarachnoid hemorrhage, unspecified: Secondary | ICD-10-CM

## 2021-09-17 DIAGNOSIS — I10 Essential (primary) hypertension: Secondary | ICD-10-CM | POA: Diagnosis present

## 2021-09-17 DIAGNOSIS — G8929 Other chronic pain: Secondary | ICD-10-CM | POA: Diagnosis present

## 2021-09-17 DIAGNOSIS — I5042 Chronic combined systolic (congestive) and diastolic (congestive) heart failure: Secondary | ICD-10-CM | POA: Diagnosis present

## 2021-09-17 DIAGNOSIS — J9602 Acute respiratory failure with hypercapnia: Secondary | ICD-10-CM | POA: Diagnosis not present

## 2021-09-17 DIAGNOSIS — J441 Chronic obstructive pulmonary disease with (acute) exacerbation: Principal | ICD-10-CM | POA: Diagnosis present

## 2021-09-17 DIAGNOSIS — I5023 Acute on chronic systolic (congestive) heart failure: Secondary | ICD-10-CM | POA: Diagnosis present

## 2021-09-17 DIAGNOSIS — I248 Other forms of acute ischemic heart disease: Secondary | ICD-10-CM | POA: Diagnosis present

## 2021-09-17 DIAGNOSIS — F419 Anxiety disorder, unspecified: Secondary | ICD-10-CM | POA: Diagnosis present

## 2021-09-17 DIAGNOSIS — J9622 Acute and chronic respiratory failure with hypercapnia: Secondary | ICD-10-CM | POA: Diagnosis present

## 2021-09-17 DIAGNOSIS — Z7952 Long term (current) use of systemic steroids: Secondary | ICD-10-CM

## 2021-09-17 DIAGNOSIS — Z823 Family history of stroke: Secondary | ICD-10-CM | POA: Diagnosis not present

## 2021-09-17 DIAGNOSIS — Z7982 Long term (current) use of aspirin: Secondary | ICD-10-CM | POA: Diagnosis not present

## 2021-09-17 DIAGNOSIS — Z8262 Family history of osteoporosis: Secondary | ICD-10-CM | POA: Diagnosis not present

## 2021-09-17 DIAGNOSIS — Z880 Allergy status to penicillin: Secondary | ICD-10-CM | POA: Diagnosis not present

## 2021-09-17 DIAGNOSIS — Z881 Allergy status to other antibiotic agents status: Secondary | ICD-10-CM

## 2021-09-17 DIAGNOSIS — I5022 Chronic systolic (congestive) heart failure: Secondary | ICD-10-CM | POA: Diagnosis present

## 2021-09-17 LAB — I-STAT ARTERIAL BLOOD GAS, ED
Acid-base deficit: 4 mmol/L — ABNORMAL HIGH (ref 0.0–2.0)
Acid-base deficit: 2 mmol/L (ref 0.0–2.0)
Bicarbonate: 23.9 mmol/L (ref 20.0–28.0)
Bicarbonate: 24.5 mmol/L (ref 20.0–28.0)
Calcium, Ion: 1.24 mmol/L (ref 1.15–1.40)
Calcium, Ion: 1.28 mmol/L (ref 1.15–1.40)
HCT: 34 % — ABNORMAL LOW (ref 36.0–46.0)
HCT: 34 % — ABNORMAL LOW (ref 36.0–46.0)
Hemoglobin: 11.6 g/dL — ABNORMAL LOW (ref 12.0–15.0)
Hemoglobin: 11.6 g/dL — ABNORMAL LOW (ref 12.0–15.0)
O2 Saturation: 99 %
O2 Saturation: 99 %
Patient temperature: 96.5
Patient temperature: 97
Potassium: 4.6 mmol/L (ref 3.5–5.1)
Potassium: 3.9 mmol/L (ref 3.5–5.1)
Sodium: 135 mmol/L (ref 135–145)
Sodium: 141 mmol/L (ref 135–145)
TCO2: 26 mmol/L (ref 22–32)
TCO2: 26 mmol/L (ref 22–32)
pCO2 arterial: 51.3 mmHg — ABNORMAL HIGH (ref 32–48)
pCO2 arterial: 44.4 mmHg (ref 32–48)
pH, Arterial: 7.271 — ABNORMAL LOW (ref 7.35–7.45)
pH, Arterial: 7.345 — ABNORMAL LOW (ref 7.35–7.45)
pO2, Arterial: 132 mmHg — ABNORMAL HIGH (ref 83–108)
pO2, Arterial: 133 mmHg — ABNORMAL HIGH (ref 83–108)

## 2021-09-17 LAB — CBC WITH DIFFERENTIAL/PLATELET
Abs Immature Granulocytes: 0.2 K/uL — ABNORMAL HIGH (ref 0.00–0.07)
Basophils Absolute: 0.2 K/uL — ABNORMAL HIGH (ref 0.0–0.1)
Basophils Relative: 1 %
Eosinophils Absolute: 0.3 K/uL (ref 0.0–0.5)
Eosinophils Relative: 1 %
HCT: 38.6 % (ref 36.0–46.0)
Hemoglobin: 11.9 g/dL — ABNORMAL LOW (ref 12.0–15.0)
Immature Granulocytes: 1 %
Lymphocytes Relative: 42 %
Lymphs Abs: 9 K/uL — ABNORMAL HIGH (ref 0.7–4.0)
MCH: 31.7 pg (ref 26.0–34.0)
MCHC: 30.8 g/dL (ref 30.0–36.0)
MCV: 102.9 fL — ABNORMAL HIGH (ref 80.0–100.0)
Monocytes Absolute: 0.8 K/uL (ref 0.1–1.0)
Monocytes Relative: 4 %
Neutro Abs: 10.9 K/uL — ABNORMAL HIGH (ref 1.7–7.7)
Neutrophils Relative %: 51 %
Platelets: 310 K/uL (ref 150–400)
RBC: 3.75 MIL/uL — ABNORMAL LOW (ref 3.87–5.11)
RDW: 14.6 % (ref 11.5–15.5)
Smear Review: NORMAL
WBC: 21.3 K/uL — ABNORMAL HIGH (ref 4.0–10.5)
nRBC: 0 % (ref 0.0–0.2)

## 2021-09-17 LAB — COMPREHENSIVE METABOLIC PANEL WITH GFR
ALT: 16 U/L (ref 0–44)
AST: 19 U/L (ref 15–41)
Albumin: 3.4 g/dL — ABNORMAL LOW (ref 3.5–5.0)
Alkaline Phosphatase: 90 U/L (ref 38–126)
Anion gap: 11 (ref 5–15)
BUN: 19 mg/dL (ref 8–23)
CO2: 23 mmol/L (ref 22–32)
Calcium: 8.9 mg/dL (ref 8.9–10.3)
Chloride: 106 mmol/L (ref 98–111)
Creatinine, Ser: 1.1 mg/dL — ABNORMAL HIGH (ref 0.44–1.00)
GFR, Estimated: 55 mL/min — ABNORMAL LOW
Glucose, Bld: 325 mg/dL — ABNORMAL HIGH (ref 70–99)
Potassium: 4.7 mmol/L (ref 3.5–5.1)
Sodium: 140 mmol/L (ref 135–145)
Total Bilirubin: 0.4 mg/dL (ref 0.3–1.2)
Total Protein: 5.6 g/dL — ABNORMAL LOW (ref 6.5–8.1)

## 2021-09-17 LAB — I-STAT CHEM 8, ED
BUN: 23 mg/dL (ref 8–23)
Calcium, Ion: 1.18 mmol/L (ref 1.15–1.40)
Chloride: 105 mmol/L (ref 98–111)
Creatinine, Ser: 1.1 mg/dL — ABNORMAL HIGH (ref 0.44–1.00)
Glucose, Bld: 324 mg/dL — ABNORMAL HIGH (ref 70–99)
HCT: 35 % — ABNORMAL LOW (ref 36.0–46.0)
Hemoglobin: 11.9 g/dL — ABNORMAL LOW (ref 12.0–15.0)
Potassium: 4.5 mmol/L (ref 3.5–5.1)
Sodium: 138 mmol/L (ref 135–145)
TCO2: 26 mmol/L (ref 22–32)

## 2021-09-17 LAB — RESP PANEL BY RT-PCR (FLU A&B, COVID) ARPGX2
Influenza A by PCR: NEGATIVE
Influenza B by PCR: NEGATIVE
SARS Coronavirus 2 by RT PCR: NEGATIVE

## 2021-09-17 LAB — BRAIN NATRIURETIC PEPTIDE: B Natriuretic Peptide: 175 pg/mL — ABNORMAL HIGH (ref 0.0–100.0)

## 2021-09-17 LAB — TROPONIN I (HIGH SENSITIVITY)
Troponin I (High Sensitivity): 42 ng/L — ABNORMAL HIGH (ref <18)
Troponin I (High Sensitivity): 272 ng/L (ref <18)
Troponin I (High Sensitivity): 445 ng/L (ref <18)
Troponin I (High Sensitivity): 434 ng/L (ref <18)

## 2021-09-17 MED ORDER — PAROXETINE HCL 30 MG PO TABS
30.0000 mg | ORAL_TABLET | Freq: Every day | ORAL | Status: DC
  Administered 2021-09-17 – 2021-09-18 (×2): 30 mg via ORAL
  Filled 2021-09-17 (×2): qty 1

## 2021-09-17 MED ORDER — SODIUM CHLORIDE 0.9% FLUSH
3.0000 mL | Freq: Two times a day (BID) | INTRAVENOUS | Status: DC
  Administered 2021-09-18: 3 mL via INTRAVENOUS

## 2021-09-17 MED ORDER — BISACODYL 5 MG PO TBEC: 5.0000 mg | DELAYED_RELEASE_TABLET | Freq: Every day | ORAL | Status: DC | PRN

## 2021-09-17 MED ORDER — PREDNISONE 20 MG PO TABS: 40.0000 mg | ORAL_TABLET | Freq: Every day | ORAL | Status: DC

## 2021-09-17 MED ORDER — DOCUSATE SODIUM 100 MG PO CAPS
100.0000 mg | ORAL_CAPSULE | Freq: Two times a day (BID) | ORAL | Status: DC
  Administered 2021-09-17 – 2021-09-18 (×2): 100 mg via ORAL
  Filled 2021-09-17 (×2): qty 1

## 2021-09-17 MED ORDER — SODIUM CHLORIDE 0.9 % IV BOLUS
1000.0000 mL | Freq: Once | INTRAVENOUS | Status: AC
  Administered 2021-09-17: 1000 mL via INTRAVENOUS

## 2021-09-17 MED ORDER — HYDRALAZINE HCL 20 MG/ML IJ SOLN: 5.0000 mg | INTRAMUSCULAR | Status: DC | PRN

## 2021-09-17 MED ORDER — MAGNESIUM OXIDE -MG SUPPLEMENT 400 (240 MG) MG PO TABS
400.0000 mg | ORAL_TABLET | Freq: Every day | ORAL | Status: DC
  Administered 2021-09-17 – 2021-09-18 (×2): 400 mg via ORAL
  Filled 2021-09-17 (×2): qty 1

## 2021-09-17 MED ORDER — OLOPATADINE HCL 0.1 % OP SOLN
1.0000 [drp] | Freq: Every day | OPHTHALMIC | Status: DC
  Administered 2021-09-18: 1 [drp] via OPHTHALMIC
  Filled 2021-09-17: qty 5

## 2021-09-17 MED ORDER — HYDROCODONE-ACETAMINOPHEN 7.5-325 MG PO TABS
1.0000 | ORAL_TABLET | Freq: Four times a day (QID) | ORAL | Status: DC | PRN
  Administered 2021-09-17: 1 via ORAL
  Filled 2021-09-17: qty 1

## 2021-09-17 MED ORDER — ENOXAPARIN SODIUM 40 MG/0.4ML IJ SOSY
40.0000 mg | PREFILLED_SYRINGE | INTRAMUSCULAR | Status: DC
  Filled 2021-09-17: qty 0.4

## 2021-09-17 MED ORDER — METHYLPREDNISOLONE SODIUM SUCC 125 MG IJ SOLR
80.0000 mg | Freq: Two times a day (BID) | INTRAMUSCULAR | Status: AC
  Administered 2021-09-17 – 2021-09-18 (×2): 80 mg via INTRAVENOUS
  Filled 2021-09-17 (×2): qty 2

## 2021-09-17 MED ORDER — LORATADINE 10 MG PO TABS
10.0000 mg | ORAL_TABLET | Freq: Every day | ORAL | Status: DC
  Administered 2021-09-17 – 2021-09-18 (×2): 10 mg via ORAL
  Filled 2021-09-17 (×2): qty 1

## 2021-09-17 MED ORDER — ACETAMINOPHEN 650 MG RE SUPP: 650.0000 mg | Freq: Four times a day (QID) | RECTAL | Status: DC | PRN

## 2021-09-17 MED ORDER — IPRATROPIUM BROMIDE 0.02 % IN SOLN
0.5000 mg | Freq: Once | RESPIRATORY_TRACT | Status: AC
  Administered 2021-09-17: 0.5 mg via RESPIRATORY_TRACT
  Filled 2021-09-17: qty 2.5

## 2021-09-17 MED ORDER — MOMETASONE FURO-FORMOTEROL FUM 200-5 MCG/ACT IN AERO
2.0000 | INHALATION_SPRAY | Freq: Two times a day (BID) | RESPIRATORY_TRACT | Status: DC
  Administered 2021-09-18: 2 via RESPIRATORY_TRACT
  Filled 2021-09-17: qty 8.8

## 2021-09-17 MED ORDER — ALBUTEROL SULFATE (2.5 MG/3ML) 0.083% IN NEBU
10.0000 mg/h | INHALATION_SOLUTION | Freq: Once | RESPIRATORY_TRACT | Status: AC
  Administered 2021-09-17: 10 mg/h via RESPIRATORY_TRACT
  Filled 2021-09-17: qty 3

## 2021-09-17 MED ORDER — POLYETHYLENE GLYCOL 3350 17 G PO PACK: 17.0000 g | PACK | Freq: Every day | ORAL | Status: DC | PRN

## 2021-09-17 MED ORDER — GUAIFENESIN ER 600 MG PO TB12
1200.0000 mg | ORAL_TABLET | Freq: Two times a day (BID) | ORAL | Status: DC
  Administered 2021-09-17 – 2021-09-18 (×3): 1200 mg via ORAL
  Filled 2021-09-17 (×3): qty 2

## 2021-09-17 MED ORDER — ACETAMINOPHEN 325 MG PO TABS
650.0000 mg | ORAL_TABLET | Freq: Four times a day (QID) | ORAL | Status: DC | PRN
  Administered 2021-09-17: 650 mg via ORAL
  Filled 2021-09-17: qty 2

## 2021-09-17 MED ORDER — ALBUTEROL SULFATE (2.5 MG/3ML) 0.083% IN NEBU: 2.5000 mg | INHALATION_SOLUTION | RESPIRATORY_TRACT | Status: DC | PRN

## 2021-09-17 MED ORDER — ALPRAZOLAM 0.5 MG PO TABS
0.5000 mg | ORAL_TABLET | Freq: Three times a day (TID) | ORAL | Status: DC | PRN
  Administered 2021-09-17 – 2021-09-18 (×2): 0.5 mg via ORAL
  Filled 2021-09-17 (×2): qty 1

## 2021-09-17 MED ORDER — ZOLPIDEM TARTRATE 5 MG PO TABS: 5.0000 mg | ORAL_TABLET | Freq: Every evening | ORAL | Status: DC | PRN

## 2021-09-17 MED ORDER — ASPIRIN 81 MG PO TBEC
81.0000 mg | DELAYED_RELEASE_TABLET | ORAL | Status: DC
  Filled 2021-09-17 (×2): qty 1

## 2021-09-17 MED ORDER — ONDANSETRON HCL 4 MG/2ML IJ SOLN: 4.0000 mg | Freq: Four times a day (QID) | INTRAMUSCULAR | Status: DC | PRN

## 2021-09-17 MED ORDER — IPRATROPIUM-ALBUTEROL 0.5-2.5 (3) MG/3ML IN SOLN
3.0000 mL | Freq: Four times a day (QID) | RESPIRATORY_TRACT | Status: DC
  Administered 2021-09-17 – 2021-09-18 (×6): 3 mL via RESPIRATORY_TRACT
  Filled 2021-09-17 (×6): qty 3

## 2021-09-17 MED ORDER — ONDANSETRON HCL 4 MG PO TABS: 4.0000 mg | ORAL_TABLET | Freq: Four times a day (QID) | ORAL | Status: DC | PRN

## 2021-09-17 NOTE — ED Provider Notes (Signed)
[facility] EMERGENCY DEPARTMENT Provider Note  CSN: [id number] Arrival date & time: 09/17/21 0436  Chief Complaint(s) Respiratory Distress (Cpap)  HPI Kim [name] is a 66 y.o. female with a past medical history listed below including COPD on 2 L nasal cannula oxygen at home, recent Takotsubo's cardiomyopathy who presents to the emergency department with severe shortness of breath that began 3 to 4 hours prior to arrival.  She reports waking up with shortness of breath that quickly worsen.  This prompted a call to EMS.  She denies any associated chest pain.  No recent infections.  No peripheral edema.  Patient was brought in by EMS who noted decreased air movement throughout.  The patient was placed on CPAP, given DuoNeb, Solu-Medrol and magnesium.  The history is provided by the patient and the EMS personnel.    Past Medical History 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   OSTEOARTHRITIS 06/05/2009   PARESTHESIA 08/24/2007   TOBACCO ABUSE 01/25/2010   Patient Active Problem List   Diagnosis Date Noted   COPD with acute exacerbation ([place]) 08/26/2021   Double vision 08/26/2021   AKI (acute kidney injury) ([place]) 08/26/2021   Postconcussive syndrome 08/26/2021   Elevated troponin 08/26/2021   Subarachnoid hemorrhage ([place]) 08/26/2021   Syncope and collapse 08/26/2021   Heart failure with preserved ejection fraction ([place]) 08/26/2021   Chronic respiratory failure with hypoxia and hypercapnia ([place]) 06/26/2021   NSTEMI (non-ST elevated myocardial infarction) ([place]) 11/17/2020   Osteoporosis 06/05/2020   Chronic diastolic CHF (congestive heart failure) ([place]) 03/24/2020   Essential hypertension 03/24/2020   Vertebral fracture, osteoporotic ([place]) 03/05/2020   History of colonic polyps    Benign neoplasm of ascending  colon    Neuritis 01/29/2017   Alpha-1-antitrypsin deficiency carrier 01/19/2017   Dependent edema 01/14/2017   Orthostatic hypotension    Allergic rhinitis 08/10/2015   Acute on chronic respiratory failure with hypoxia ([place]) 07/05/2015   Hyperglycemia    HLD (hyperlipidemia)    Coronary artery calcification seen on CAT scan 07/02/2015   Solitary pulmonary nodule 12/02/2013   Loss of weight 11/10/2013   Osteoarthritis 06/05/2009   Brachial neuritis or radiculitis 06/05/2009   Dyslipidemia 03/12/2009   NEPHROLITHIASIS 01/05/2008   COPD GOLD III  08/31/2007   PARESTHESIA 08/24/2007   Anxiety and depression 11/19/2006   Home Medication(s) Prior to Admission medications   Medication Sig Start Date End Date Taking? Authorizing Provider  acetaminophen (TYLENOL) 500 MG tablet Take 500 mg by mouth 2 (two) times daily as needed for headache.    [provider]  albuterol (VENTOLIN HFA) 108 (90 Base) MCG/ACT inhaler INHALE TWO PUFFS INTO LUNGS EVERY 4 TO 6 HOURS AS NEEDED FOR SHORTNESS OF BREATH/WHEEZING. 07/26/21   Martyn Ehrich, NP  ALPRAZolam Duanne Moron) 0.5 MG tablet Take 1 tablet (0.5 mg total) by mouth 3 (three) times daily as needed for anxiety. 09/05/21 10/05/21  [name], NP  aspirin EC 81 MG EC tablet Take 1 tablet (81 mg total) by mouth daily. 07/08/15   Nita Sells, MD  azithromycin (ZITHROMAX) 250 MG tablet Take 1 tablet (250 mg total) by mouth daily. 09/04/21   [name], MD  butalbital-acetaminophen-caffeine (FIORICET) [phone] MG tablet Take 1 tablet by mouth every 6 (six) hours as needed for headache. 09/05/21 10/05/21  [name], NP  Calcium  Carb-Cholecalciferol (CALCIUM 600 + D PO) Take 600 mg by mouth daily. Calcium 600 mg + 4000 unit D3    [provider]  cetirizine (ZYRTEC) 10 MG tablet Take 10 mg by mouth daily.    [provider]  Cholecalciferol (VITAMIN D3 PO) Take 5,000 Units by mouth daily.    [provider]   furosemide (LASIX) 40 MG tablet Take 1 tablet (40 mg total) by mouth daily as needed (for an overnight weight gain of 3-5 pounds of fluid). 08/16/21   [name], MD  Ginkgo Biloba Extract 60 MG CAPS Take 1 capsule by mouth daily. 08/29/21   [provider]  HYDROcodone-acetaminophen (NORCO) 7.5-325 MG tablet Take 1 tablet by mouth every 6 (six) hours as needed for moderate pain. 08/29/21   Ghimire, Henreitta Leber, MD  ipratropium-albuterol (DUONEB) 0.5-2.5 (3) MG/3ML SOLN USE 3 ML VIA NEBULIZER EVERY 6 HOURS AS NEEDED.  DX: J44.9 Patient taking differently: Take 3 mLs by nebulization. 3 ml nebulization five times a day as needed for shortness of breath 07/09/21   Margaretha Seeds, MD  lidocaine (LIDODERM) 5 % Place 1 patch onto the skin daily. Remove & Discard patch within 12 hours or as directed by MD 08/29/21   Jonetta Osgood, MD  losartan (COZAAR) 25 MG tablet Take 1 tablet (25 mg total) by mouth daily. 04/26/21   Richardson Dopp T, PA-C  magnesium oxide (MAG-OX) 400 MG tablet Take 400 mg by mouth daily.    [provider]  metoprolol succinate (TOPROL-XL) 25 MG 24 hr tablet Take 0.5 tablets (12.5 mg total) by mouth daily. 01/04/21   [name], MD  MUCINEX MAXIMUM STRENGTH 1200 MG TB12 Take 1,200 mg by mouth every 12 (twelve) hours.    [provider]  nitroGLYCERIN (NITROSTAT) 0.4 MG SL tablet Place 1 tablet (0.4 mg total) under the tongue every 5 (five) minutes as needed for chest pain. 10/24/20   [name], MD  nystatin (MYCOSTATIN) 100000 UNIT/ML suspension Take 5 mLs by mouth 3 (three) times daily as needed (thrush). 08/05/21   [provider]  Olopatadine HCl (PATADAY OP) Place 1 drop into the left eye daily.    [provider]  ondansetron (ZOFRAN) 4 MG tablet Take 1 tablet (4 mg total) by mouth every 8 (eight) hours as needed for nausea or vomiting. 09/05/21   Nafziger, Tommi Rumps, NP  OXYGEN Inhale 2 L/min into the lungs  continuous.    [provider]  PARoxetine (PAXIL) 30 MG tablet Take 1 tablet (30 mg total) by mouth daily. 06/11/21   Nafziger, Tommi Rumps, NP  Potassium 99 MG TABS Take 99 mg by mouth daily.    [provider]  potassium chloride (KLOR-CON) 10 MEQ tablet Take 1 tablet (10 mEq total) by mouth as needed (take with furosemide (lasix)). 09/12/21   Swinyer, Lanice Schwab, NP  predniSONE (DELTASONE) 10 MG tablet Take 1 tablet (10 mg total) by mouth daily. 09/04/21   [name], MD  Spacer/Aero Chamber Mouthpiece MISC 1 Device by Does not apply route as directed. 01/12/17   Javier Glazier, MD  SYMBICORT 160-4.5 MCG/ACT inhaler INHALE 2 PUFFS TWICE DAILY Patient taking differently: 2 puffs 2 (two) times daily. 12/31/20   Martyn Ehrich, NP  Tiotropium Bromide Monohydrate (SPIRIVA RESPIMAT) 2.5 MCG/ACT AERS Inhale 2 puffs into the lungs daily. Patient taking differently: Inhale 2 puffs into the lungs daily as needed (wheezing). 12/06/20   Martyn Ehrich, NP  Allergies Tape, Daliresp [roflumilast], Latex, Lisinopril, Morphine and related, Penicillins, and Omnicef [cefdinir]  Review of Systems Review of Systems As noted in HPI  Physical Exam Vital Signs  I have reviewed the triage vital signs Pulse 87   Resp (!) 36   Ht 5' 1.75" (1.568 m)   Wt 64.8 kg   SpO2 100%   BMI 26.34 kg/m   Physical Exam Vitals reviewed.  Constitutional:      General: She is not in acute distress.    Appearance: She is well-developed. She is not diaphoretic.  HENT:     Head: Normocephalic and atraumatic.     Nose: Nose normal.  Eyes:     General: No scleral icterus.       Right eye: No discharge.        Left eye: No discharge.     Conjunctiva/sclera: Conjunctivae normal.     Pupils: Pupils are equal, round, and reactive to light.  Cardiovascular:     Rate and  Rhythm: Regular rhythm. Tachycardia present.     Heart sounds: No murmur heard.    No friction rub. No gallop.  Pulmonary:     Effort: Tachypnea, accessory muscle usage, respiratory distress and retractions present.     Breath sounds: Decreased air movement present.  Abdominal:     General: There is no distension.     Palpations: Abdomen is soft.     Tenderness: There is no abdominal tenderness.  Musculoskeletal:        General: No tenderness.     Cervical back: Normal range of motion and neck supple.  Skin:    General: Skin is cool.     Coloration: Skin is cyanotic and mottled.     Findings: No erythema or rash.  Neurological:     Mental Status: She is alert and oriented to person, place, and time.     ED Results and Treatments Labs (all labs ordered are listed, but only abnormal results are displayed) Labs Reviewed  RESP PANEL BY RT-PCR (FLU A&B, COVID) [id number]  COMPREHENSIVE METABOLIC PANEL  CBC WITH DIFFERENTIAL/PLATELET  BRAIN NATRIURETIC PEPTIDE  I-STAT CHEM 8, ED  I-STAT ARTERIAL BLOOD GAS, ED  TROPONIN I (HIGH SENSITIVITY)                                                                                                                         EKG  EKG Interpretation  Date/Time:    Ventricular Rate:    PR Interval:    QRS Duration:   QT Interval:    QTC Calculation:   R Axis:     Text Interpretation:         Radiology No results found.  Pertinent labs & imaging results that were available during my care of the patient were reviewed by me and considered in my medical decision making (see MDM for details).  Medications Ordered in ED Medications  albuterol (PROVENTIL) (2.5 MG/3ML) 0.083% nebulizer solution (has no administration in  time range)  ipratropium (ATROVENT) nebulizer solution 0.5 mg (has no administration in time range)                                                                                                                                      Procedures .Critical Care  Performed by: Fatima Blank, MD Authorized by: Fatima Blank, MD   Critical care provider statement:    Critical care time (minutes):  65   Critical care time was exclusive of:  Separately billable procedures and treating other patients   Critical care was necessary to treat or prevent imminent or life-threatening deterioration of the following conditions:  Respiratory failure   Critical care was time spent personally by me on the following activities:  Development of treatment plan with patient or surrogate, discussions with consultants, evaluation of patient's response to treatment, examination of patient, obtaining history from patient or surrogate, review of old charts, re-evaluation of patient's condition, pulse oximetry, ordering and review of radiographic studies, ordering and review of laboratory studies and ordering and performing treatments and interventions   Care discussed with: admitting provider     (including critical care time)  Medical Decision Making / ED Course    Complexity of Problem:  Co-morbidities/SDOH that complicate the patient evaluation/care: Noted in HPI  ED Course:  Clinical Course as of 09/17/21 0807  Tue Sep 17, 2021  0449 Patient presents in respiratory distress. Most suspicious for COPD exacerbation. Will need to assess for PTX. Will also look for PNA. Given recent history of Takotsubo's, will obtain cardiac work-up. Less concerning for pulmonary embolism however if patient does not respond to initial treatment, will reconsider.  Patient transition to BiPAP immediately upon arrival.  [PC]  0452 EKG was sinus tachycardia and frequent PVCs.  No acute ischemic changes. [PC]  0510 ABG consistent with respiratory acidosis likely from COPD exacerbation.  Chest x-ray without evidence of pneumonia, pneumothorax, pulmonary edema. [PC]  0530 CBC with leukocytosis close to her previous.  No significant  anemia  Metabolic panel with hyperglycemia without evidence of DKA.  Stable renal function.  Troponin slightly elevated but significantly improved from recent admission.  Either downtrending or likely demand from respiratory distress.  We will trend. [PC]  [id number] On reassessment patient's work of breathing has significantly improved while on BiPAP.  She is now able to speak in full sentences.  Plan to repeat ABG [PC]  0704 ABG improving. Will admit to hospitalist service [PC]  [phone] Spoke with Dr. Lorin Mercy who agreed to admit patient. [PC]    Clinical Course User Index [PC] Alexie Samson, Grayce Sessions, MD   Medical Decision Making Problems Addressed: Acute respiratory failure with hypoxia and hypercapnia [facility]): acute illness or injury that poses a threat to life or bodily functions  Amount and/or Complexity of Data Reviewed External Data Reviewed: radiology, ECG and notes.    Details: Details from recent admission noted  above in HPI Labs: ordered. Decision-making details documented in ED Course. Radiology: ordered and independent interpretation performed. Decision-making details documented in ED Course. ECG/medicine tests: ordered and independent interpretation performed. Decision-making details documented in ED Course.  Risk Prescription drug management. Decision regarding hospitalization.     Final Clinical Impression(s) / ED Diagnoses Final diagnoses:  Acute respiratory failure with hypoxia and hypercapnia ([place])           This chart was dictated using voice recognition software.  Despite best efforts to proofread,  errors can occur which can change the documentation meaning.    Fatima Blank, MD 09/17/21 [phone]
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
## 2021-09-17 NOTE — Consult Note (Addendum)
Cardiology Consultation:   Patient ID: LAEL WETHERBEE MRN: [id number]; DOB: May 23, 1955  Admit date: 09/17/2021 Date of Consult: 09/17/2021  PCP:  [name], NP   [facility] Providers Cardiologist:  Lauree Chandler, MD   {  Patient Profile:   Kim [name] is a 66 y.o. female with a hx of recurrent Takotsubo cardiomyopathy (2015, 06/2015, 11/2020 & 08/2021), chronic respiratory failure/COPD on 2 L oxygen during daytime and recently started BiPAP at night, osteoporosis, hyperlipidemia, subdural hematoma who is being seen 09/17/2021 for the evaluation of elevated troponin at the request of Dr. Lorin Mercy.  Patient with history of recurrent Takotsubo cardiomyopathy dating back to 2015.  Each time patient noted in respiratory distress and COPD exacerbation.  Cath in [facility], 2015: Minimal nonobstructive CAD Echocardiogram in 2015 with variant of stress cardiomyopathy Cath in 2017: minimal nonobstructive CAD Echo in 2017 with LV function of 25 to 30%.  Cardiac cath with minimal CAD. Echocardiogram 5/18: normal EF Echocardiogram 4/21: Normal EF  Cath 11/2020: No CAD Echo 11/2020:  EF 30-35, apical WMA   Echocardiogram 9/22: EF 55-60   Most recent admission 08/2021 for syncope and fall.  Found to have COPD exasperation and subdural hematoma.  Neurosurgery recommended nonsurgical management.  Troponin was elevated in the 600s.  Echocardiogram again with reduced LV function at 35 to 40% with regional wall motion abnormality consistent with atypical stress induced cardiomyopathy.  Seen in clinic 6/8 for follow-up.  Reported dizziness, especially with standing up.  She was placed on monitor and order carotid Doppler.  History of Present Illness:   Kim [name] was doing well after discharge.  Felt better since placed on BiPAP.  She was walking with support against wall.  Yesterday she got first zoledronic injection for osteoporosis.  This morning around 4 AM she woke up with  worsening shortness of breath despite wearing BiPAP.  Says she thought she was going in panic attack and required Xanax.  EMS was called and placed on BiPAP and brought to ER.  Denies any chest pain or shortness of breath.  Now weaned off BiPAP.  Cardiology is asked for elevated troponin 42>>272.  She is admitted for COPD exacerbation.  Chest x-ray negative for pneumonia.  Pulmonary also consulted.    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   OSTEOARTHRITIS 06/05/2009   PARESTHESIA 08/24/2007   TOBACCO ABUSE 01/25/2010    Past Surgical History:  Procedure Laterality Date   ABDOMINAL HYSTERECTOMY     APPENDECTOMY  1969   CARDIAC CATHETERIZATION N/A 07/05/2015   Procedure: Left Heart Cath and Coronary Angiography;  Surgeon: Leonie Man, MD;  Location: [facility];  Service: Cardiovascular;  Laterality: N/A;   CHOLECYSTECTOMY  1989   collapse lung  1990   COLONOSCOPY WITH PROPOFOL N/A 05/14/2018   Procedure: COLONOSCOPY WITH PROPOFOL;  Surgeon: Irene Shipper, MD;  Location: [facility];  Service: Endoscopy;  Laterality: N/A;   EXCISION MASS ABDOMINAL  2019   EXPLORATORY LAPAROTOMY  1969   LEFT HEART CATH AND CORONARY ANGIOGRAPHY N/A 11/19/2020   Procedure: LEFT HEART CATH AND CORONARY ANGIOGRAPHY;  Surgeon: [name], MD;  Location: [facility];  Service: Cardiovascular;  Laterality: N/A;   POLYPECTOMY  05/14/2018   Procedure: POLYPECTOMY;  Surgeon: Irene Shipper, MD;  Location: [facility];  Service:  Endoscopy;;   SALPINGOOPHORECTOMY  2019    Inpatient Medications: Scheduled Meds:  aspirin EC  81 mg Oral 3 days   docusate sodium  100 mg Oral BID   guaiFENesin  1,200 mg Oral Q12H   ipratropium-albuterol  3 mL Nebulization Q6H   loratadine  10 mg Oral Daily   magnesium oxide  400 mg Oral Daily   methylPREDNISolone  (SOLU-MEDROL) injection  80 mg Intravenous Q12H   Followed by   Derrill Memo ON 09/18/2021] predniSONE  40 mg Oral Q breakfast   mometasone-formoterol  2 puff Inhalation BID   olopatadine  1 drop Left Eye Daily   PARoxetine  30 mg Oral Daily   sodium chloride flush  3 mL Intravenous Q12H   Continuous Infusions:  PRN Meds: acetaminophen **OR** acetaminophen, albuterol, ALPRAZolam, bisacodyl, hydrALAZINE, HYDROcodone-acetaminophen, ondansetron **OR** ondansetron (ZOFRAN) IV, polyethylene glycol, zolpidem  Allergies:    Allergies  Allergen Reactions   Tape Other (See Comments)    Skin tears easily, peels skin off   Daliresp [Roflumilast]     Skin peeling   Latex Other (See Comments)    Peels skin off   Lisinopril Cough   Morphine And Related     hallucinations   Penicillins Hives and Other (See Comments)    Tolerates Omnicef Has patient had a PCN reaction causing immediate rash, facial/tongue/throat swelling, SOB or lightheadedness with hypotension: Yes Has patient had a PCN reaction causing severe rash involving mucus membranes or skin necrosis: No Has patient had a PCN reaction that required hospitalization No Has patient had a PCN reaction occurring within the last 10 years: No If all of the above answers are "NO", then may proceed with Cephalosporin use.    Omnicef [Cefdinir] Rash    Social History:   Social History   Socioeconomic History   Marital status: Married    Spouse name: [name]   Number of children: 2   Years of education: Not on file   Highest education level: Not on file  Occupational History   Occupation: Patient care aide-sits with elderly lady  Tobacco Use   Smoking status: Former    Packs/day: 2.00    Years: 38.00    Total pack years: 76.00    Types: Cigarettes    Quit date: 03/07/2014    Years since quitting: 7.5   Smokeless tobacco: Never  Vaping Use   Vaping Use: Never used  Substance and Sexual Activity   Alcohol use: No    Alcohol/week: 0.0  standard drinks of alcohol   Drug use: No   Sexual activity: Not on file  Other Topics Concern   Not on file  Social History Narrative    Pulmonary (01/12/17):   Originally from [facility] [place]. Has lived in [place] for 17 years. Married. Has 2 dogs currently. No mold and only 1 indoor plant. Had her home professionally cleaned a month ago. No bird or hot tub exposure. 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    Right handed    Social Determinants of Health   Financial Resource Strain: High Risk (09/10/2021)   Overall Financial Resource Strain (CARDIA)    Difficulty of Paying Living Expenses: Very hard  Food Insecurity: No Food Insecurity (09/10/2021)   Hunger Vital Sign    Worried About Running Out of Food in the Last Year: Never true    Ran Out of Food in the Last Year: Never true  Transportation Needs: No Transportation Needs (09/10/2021)   PRAPARE -  Hydrologist (Medical): No    Lack of Transportation (Non-Medical): No  Physical Activity: Inactive (03/25/2021)   Exercise Vital Sign    Days of Exercise per Week: 0 days    Minutes of Exercise per Session: 0 min  Stress: No Stress Concern Present (09/10/2021)   [place]    Feeling of Stress : Not at all  Social Connections: Moderately Isolated (03/25/2021)   Social Connection and Isolation Panel [NHANES]    Frequency of Communication with Friends and Family: More than three times a week    Frequency of Social Gatherings with Friends and Family: More than three times a week    Attends Religious Services: Never    Marine scientist or Organizations: No    Attends Archivist Meetings: Never    Marital Status: Married  Human resources officer Violence: Not At Risk (03/25/2021)   Humiliation, Afraid, Rape, and Kick questionnaire    Fear of Current or Ex-Partner: No    Emotionally Abused: No    Physically Abused: No     Sexually Abused: No    Family History:   Family History  Problem Relation Age of Onset   Cancer Mother        lung   Heart attack Mother    Hypertension Sister    Multiple sclerosis Brother    Diabetes Sister    Rheum arthritis Sister    Thyroid disease Sister    Multiple sclerosis Other    Alcohol abuse Other    Arthritis Other    Diabetes Other    Kidney disease Other    Cancer Other        lung,ovarian,skin, uterine   Stroke Other    Heart disease Other    Melanoma Other    Osteoporosis Other    Heart attack Maternal Uncle    Heart attack Other        [place]   Heart attack Other        NEPHEW   Hypertension Brother    Stroke Maternal Grandmother    Colon cancer Neg Hx      ROS:  Please see the history of present illness.  All other ROS reviewed and negative.     Physical Exam/Data:   Vitals:   09/17/21 0845 09/17/21 0900 09/17/21 0930 09/17/21 1000  BP: (!) '93/58 91/66 97/71 '$ 92/63  Pulse: 91 87 94 90  Resp: (!) 27 (!) 22 19 (!) 22  Temp:      TempSrc:      SpO2: 97% 96% 96% 96%  Weight:      Height:        Intake/Output Summary (Last 24 hours) at 09/17/2021 1245 Last data filed at 09/17/2021 1047 Gross per 24 hour  Intake 1000 ml  Output --  Net 1000 ml      09/17/2021    4:49 AM 09/16/2021    1:04 PM 09/12/2021    2:12 PM  Last 3 Weights  Weight (lbs) 142 lb 13.7 oz 142 lb 12.8 oz 141 lb  Weight (kg) 64.8 kg 64.774 kg 63.957 kg     Body mass index is 26.34 kg/m.  General: Ill-appearing elderly female in no acute distress HEENT: normal Neck: no JVD Vascular: No carotid bruits; Distal pulses 2+ bilaterally Cardiac:  normal S1, S2; RRR; no murmur  Lungs: Diminished breath sound throughout with diffuse wheezing and bibasilar Rales Abd: soft, nontender, no  hepatomegaly  Ext: no edema Musculoskeletal:  No deformities, BUE and BLE strength normal and equal Skin: warm and dry  Neuro:  CNs 2-12 intact, no focal abnormalities noted Psych:   Normal affect   EKG:  The EKG was personally reviewed and demonstrates: Sinus tachycardia, PVC, short PR interval Telemetry:  Telemetry was personally reviewed and demonstrates:  N/A seen In Trauma room   Relevant CV Studies:  Echo 08/26/2021  1. Left ventricular ejection fraction, by estimation, is 35 to 40%. The  left ventricle has moderately decreased function. The left ventricle  demonstrates regional wall motion abnormalities (see scoring  diagram/findings for description). There is  hypokinesis of all mid LV segments although appears slightly worse in the  mid septal LV segments. The apical LV segments appear hyperkinetic. Wall  motion suggestive of atypical stress cardiomyopathy as does not appear to  follow coronary artery pattern.  Clinical correlation recommended.   2. Left ventricular diastolic parameters are consistent with Grade I  diastolic dysfunction (impaired relaxation).   3. Right ventricular systolic function is normal. The right ventricular  size is normal.   4. The mitral valve is normal in structure. Trivial mitral valve  regurgitation.   5. The aortic valve was not well visualized. Aortic valve regurgitation  is mild. Aortic valve sclerosis/calcification is present, without any  evidence of aortic stenosis.   6. The inferior vena cava is normal in size with greater than 50%  respiratory variability, suggesting right atrial pressure of 3 mmHg.   Comparison(s): Compared to prior TTE in 11/2020, there is now hyperkinesis  of the LV apical segments (previously akinetic) and hypokinesis of the mid  LV segments. EF now 35-40% (previously 30-35%). Again, findings most  consistent with atypical stress  cardiomyopathy.   LEFT HEART CATH AND CORONARY ANGIOGRAPHY  11/2020   Conclusions: Mild plaquing of the proximal LAD with minimal luminal narrowing.  Otherwise, no angiographically significant coronary artery disease.  Findings are similar to prior catheterization in  2017 and presumably due to stress-induced cardiomyopathy.   Recommendations: Medical therapy and risk factor modification to prevent progression of coronary artery disease. Escalate goal-directed medical therapy for treatment of non-ischemic cardiomyopathy (presumably stress-induced).  Laboratory Data:  High Sensitivity Troponin:   Recent Labs  Lab 08/26/21 0558 08/26/21 0904 08/26/21 1543 09/17/21 0444 09/17/21 0633  TROPONINIHS 311* 423* 676* 42* 272*     Chemistry Recent Labs  Lab 09/17/21 0444 09/17/21 0457 09/17/21 0500 09/17/21 0637  NA 140 135 138 141  K 4.7 4.6 4.5 3.9  CL 106  --  105  --   CO2 23  --   --   --   GLUCOSE 325*  --  324*  --   BUN 19  --  23  --   CREATININE 1.10*  --  1.10*  --   CALCIUM 8.9  --   --   --   GFRNONAA 55*  --   --   --   ANIONGAP 11  --   --   --     Recent Labs  Lab 09/17/21 0444  PROT 5.6*  ALBUMIN 3.4*  AST 19  ALT 16  ALKPHOS 90  BILITOT 0.4   Hematology Recent Labs  Lab 09/17/21 0444 09/17/21 0457 09/17/21 0500 09/17/21 0637  WBC 21.3*  --   --   --   RBC 3.75*  --   --   --   HGB 11.9* 11.6* 11.9* 11.6*  HCT 38.6 34.0* 35.0*  34.0*  MCV 102.9*  --   --   --   MCH 31.7  --   --   --   MCHC 30.8  --   --   --   RDW 14.6  --   --   --   PLT 310  --   --   --    BNP Recent Labs  Lab 09/17/21 0444  BNP 175.0*    Radiology/Studies:  DG Chest Port 1 View  Result Date: 09/17/2021 CLINICAL DATA:  66 year old female with shortness of breath, respiratory distress. EXAM: PORTABLE CHEST 1 VIEW COMPARISON:  Portable chest 08/26/2021 and earlier. Chest CT 08/28/2021 and earlier. FINDINGS: Centrilobular emphysema better demonstrated by CT last month. Portable AP semi upright view at 0451 hours. Stable lung volumes and mediastinal contours. No pneumothorax, pulmonary edema, pleural effusion, or confluent pulmonary opacity. 5 cm generator type device newly projects over the left upper chest, but no connected leads are  identified. EKG leads elsewhere. Visualized tracheal air column is within normal limits. No acute osseous abnormality identified. IMPRESSION: Emphysema.  No acute cardiopulmonary abnormality. Electronically Signed   By: Genevie Ann M.D.   On: 09/17/2021 05:39     Assessment and Plan:   Elevated troponin Likely demand in setting of acute COPD exacerbation.  Prior cardiac cath in 2015, 2017 and most recently August 2022 without evidence of obstructive CAD.  Patient denies any chest pain.  2.  Recurrent stress induced cardiomyopathy -Dating back to 2015, most recently 08/2021 after fall and COPD exacerbation. -Guideline directed medical therapy limited secondary to hypotension  3.  Acute on chronic respiratory failure secondary to COPD exacerbation -Per primary team  4.  Recent syncope and dizziness -Currently wearing ZIO monitor -Pending carotid Doppler  MD to see.   For questions or updates, please contact [place] Please consult [email or web address] for contact info under    Jarrett Soho, PA  09/17/2021 12:45 PM   Personally seen and examined. Agree with APP above with the following comments:  Briefly 66 yo F with a history of recurrent stress induced cardiomyopathy, Asthma and COPD mixed picture with concern for sun exposed exacerbation.   Patient notes that she was doing well until 09/17/21.   Who presents with SOB despite being on nocturnal BIPAP.  She did well after her recent Reclast injection until this AM.  She had increase in O2 and called for eval.   Since starting duonebs (last 1300) her breathing is much improved.  She notes that she had a 35 pack year history prior to stopping and this is what her COPD exacerbations feel like.  She notes that since she has been on steroids she has had weight gain and leg swelling; she takes the diuretic if significant weight gain.   Exam notable for Bilateral wheezes but moves air, regular rate and rhythm seen at different  time than above (seen post duonebs X2) She has her ZioPatch on presently  Labs notable for Hs troponin 42-> 272.  Given  Tele: sinus rhythm to sinus tachycardia with rare PVCs EF 35%  Would recommend  - suspect demand related troponin elevation, given 5/23 subdural hematoma  - continue telemetry (had May 2023 fall) - she has limited BP room for HF GDM: will attempt to add base her losartan and if able will add low dose Coreg; she has had significant COPD and we may end up with no BB at discharge   Rudean Haskell, MD Cardiologist Hypertrophic Cardiomyopathy [facility]  [facility]  [address] [place], [place] [address] [phone]  3:20 PM

## 2021-09-17 NOTE — Telephone Encounter (Signed)
Please ask pharmacy to initiate process to get her on [place] [address] 09/17/2021  She has repeated exacerbations with sudden explosive onset suggestive of asthma,in spite of maximal triple therapy and maintenance steroids

## 2021-09-17 NOTE — Consult Note (Signed)
NAME:  Kim [name], MRN:  [id number], DOB:  10-27-1955, LOS: 0 ADMISSION DATE:  09/17/2021, CONSULTATION DATE: 09/17/2021 REFERRING MD: [facility], CHIEF COMPLAINT: Short of breath  History of Present Illness:  66 year old female who is listed as a DNR due to her plethora of health issues and well-documented below and is notable for Takotsubo's cardiomyopathy.  She received injection for osteoporosis 09/16/2021 which did not have complications at the time of injection.  She woke up this morning on her BiPAP extremely short of breath blood pressure 230/110 and she was transferred to the hospital emergently.  On my arrival she is awake alert blood pressure was 96/43 she did not have crackles heart sounds are distant but regular she reports she has had several episodes of the same problem.  He currently has a heart monitor packaged in place for outpatient monitoring.  She will be admitted to the hospital she is listed as a DNR but there may be some confusion she was talking about short-term intubation CPR but I explained to her with her osteoporosis and her serious cardiomyopathy she would probably not survive this and it would create more pain and discomfort for her.  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 noted to have a blood pressure 140/1 2  Objective   Blood pressure 92/63, pulse 90, temperature (!) 96.5 F (35.8 C), temperature source Axillary, resp. rate (!) 22, height 5' 1.75" (1.568  m), weight 64.8 kg, SpO2 96 %.    Vent Mode: BIPAP;PCV FiO2 (%):  [40 %] 40 % Set Rate:  [14 bmp] 14 bmp PEEP:  [6 cmH20] 6 cmH20   Intake/Output Summary (Last 24 hours) at 09/17/2021 1204 Last data filed at 09/17/2021 1047 Gross per 24 hour  Intake 1000 ml  Output --  Net 1000 ml   Filed Weights   09/17/21 0449  Weight: 64.8 kg    Examination: General: Well-nourished well-developed female no acute distress HENT: No JVD or lymphadenopathy is appreciated Lungs: Diminished breath sounds throughout Cardiovascular: Heart sounds are distant with regular rate and rhythm Abdomen: Soft nontender positive bowel sounds Extremities: Without edema Neuro: Grossly intact without focal defect GU: Wick in place  Resolved Hospital Problem list     Assessment & Plan:  Acute on chronic hypoxic respiratory failure in the setting of severe cardiomyopathy.Takotsubo's cardiomyopathy.  She has had multiple episodes of acute respiratory distress with hypertension inability to breathe which usually predicated by some type of ejection. Continue current interventions she is in no acute distress at rest Nocturnal BiPAP Steroids are noted 2D echo is pending Admit  Gold 3 COPD followed by Dr. Durwin Nora Continue steroids and bronchodilators  Chronic diastolic heart failure Consider cards consult  Best Practice (right click and "Reselect all SmartList Selections" daily)   Diet/type: Regular consistency (see orders) DVT prophylaxis: LMWH GI prophylaxis: PPI Lines: N/A Foley:  N/A Code Status:  DNR Last date of multidisciplinary goals of care discussion [tbd] 09/17/2021.  Confusion about her CODE STATUS.  She states she does not want to be in vegetative state but would want short-term intubation and chest compressions and shock.  Discussed with her the fact that none of those interventions would probably make anything better with her but would cause her quite a bit of real discomfort without changing any  outcome. Labs   CBC: Recent Labs  Lab 09/17/21 0444 09/17/21 0457 09/17/21 0500 09/17/21 0637  WBC 21.3*  --   --   --   NEUTROABS 10.9*  --   --   --   HGB 11.9* 11.6* 11.9* 11.6*  HCT 38.6 34.0* 35.0* 34.0*  MCV 102.9*  --   --   --   PLT 310  --   --   --     Basic Metabolic Panel: Recent Labs  Lab 09/17/21 0444 09/17/21 0457 09/17/21 0500 09/17/21 0637  NA 140 135 138 141  K 4.7 4.6 4.5 3.9  CL 106  --  105  --   CO2 23  --   --   --   GLUCOSE 325*  --  324*  --   BUN 19  --  23  --   CREATININE 1.10*  --  1.10*  --   CALCIUM 8.9  --   --   --    GFR: Estimated Creatinine Clearance: 43.6 mL/min (A) (by C-G formula based on SCr of 1.1 mg/dL (H)). Recent Labs  Lab 09/17/21 0444  WBC 21.3*    Liver Function Tests: Recent Labs  Lab 09/17/21 0444  AST 19  ALT 16  ALKPHOS 90  BILITOT 0.4  PROT 5.6*  ALBUMIN 3.4*   No results for input(s): "LIPASE", "AMYLASE" in the last 168 hours. No results for input(s): "AMMONIA" in the last 168 hours.  ABG    Component Value Date/Time   PHART 7.345 (L) 09/17/2021 0637   PCO2ART 44.4 09/17/2021 0637   PO2ART 133 (H) 09/17/2021 0637   HCO3 24.5 09/17/2021 0637   TCO2 26 09/17/2021 0637   ACIDBASEDEF 2.0 09/17/2021 0637   O2SAT 99 09/17/2021 0637     Coagulation Profile: No results for input(s): "INR", "PROTIME" in the last 168 hours.  Cardiac Enzymes: No results for input(s): "CKTOTAL", "CKMB", "CKMBINDEX", "TROPONINI" in the last 168 hours.  HbA1C: Hgb A1c MFr Bld  Date/Time Value Ref Range Status  03/22/2018 07:57 AM 6.0 4.6 - 6.5 % Final    Comment:    Glycemic Control Guidelines for People with Diabetes:Non Diabetic:  <6%Goal of Therapy: <7%Additional Action Suggested:  >8%     CBG: No results for input(s): "GLUCAP" in the last 168 hours.  Review of Systems:   10 point review of system taken, please see HPI for positives and negatives. No reports of significant change until she had osteoporosis  medication 09/16/2021 and woke up this a.m. 09/17/2021 acutely short of breath with a blood pressure 240/110.  Past Medical History:  She,  has a past medical history of C8 RADICULOPATHY (06/05/2009), COPD (08/31/2007), DEPRESSION (11/19/2006), DYSLIPIDEMIA (03/12/2009), MI (mitral incompetence), NEPHROLITHIASIS (01/05/2008), OSTEOARTHRITIS (06/05/2009), PARESTHESIA (08/24/2007), and TOBACCO ABUSE (01/25/2010).   Surgical History:   Past Surgical History:  Procedure Laterality Date   ABDOMINAL HYSTERECTOMY     APPENDECTOMY  1969   CARDIAC CATHETERIZATION N/A 07/05/2015   Procedure: Left Heart Cath and Coronary Angiography;  Surgeon: Leonie Man, MD;  Location: [facility];  Service: Cardiovascular;  Laterality: N/A;   CHOLECYSTECTOMY  1989   collapse lung  1990  COLONOSCOPY WITH PROPOFOL N/A 05/14/2018   Procedure: COLONOSCOPY WITH PROPOFOL;  Surgeon: Irene Shipper, MD;  Location: [facility];  Service: Endoscopy;  Laterality: N/A;   EXCISION MASS ABDOMINAL  2019   EXPLORATORY LAPAROTOMY  1969   LEFT HEART CATH AND CORONARY ANGIOGRAPHY N/A 11/19/2020   Procedure: LEFT HEART CATH AND CORONARY ANGIOGRAPHY;  Surgeon: [name], MD;  Location: [facility];  Service: Cardiovascular;  Laterality: N/A;   POLYPECTOMY  05/14/2018   Procedure: POLYPECTOMY;  Surgeon: Irene Shipper, MD;  Location: [facility];  Service: Endoscopy;;   SALPINGOOPHORECTOMY  2019     Social History:   reports that she quit smoking about 7 years ago. Her smoking use included cigarettes. She has a 76.00 pack-year smoking history. She has never used smokeless tobacco. She reports that she does not drink alcohol and does not use drugs.   Family History:  Her family history includes Alcohol abuse in an other family member; Arthritis in an other family member; Cancer in her mother and another family member; Diabetes in her sister and another family member; Heart attack in her maternal uncle, mother, and other  family members; Heart disease in an other family member; Hypertension in her brother and sister; Kidney disease in an other family member; Melanoma in an other family member; Multiple sclerosis in her brother and another family member; Osteoporosis in an other family member; Rheum arthritis in her sister; Stroke in her maternal grandmother and another family member; Thyroid disease in her sister. There is no history of Colon cancer.   Allergies Allergies  Allergen Reactions   Tape Other (See Comments)    Skin tears easily, peels skin off   Daliresp [Roflumilast]     Skin peeling   Latex Other (See Comments)    Peels skin off   Lisinopril Cough   Morphine And Related     hallucinations   Penicillins Hives and Other (See Comments)    Tolerates Omnicef Has patient had a PCN reaction causing immediate rash, facial/tongue/throat swelling, SOB or lightheadedness with hypotension: Yes Has patient had a PCN reaction causing severe rash involving mucus membranes or skin necrosis: No Has patient had a PCN reaction that required hospitalization No Has patient had a PCN reaction occurring within the last 10 years: No If all of the above answers are "NO", then may proceed with Cephalosporin use.    Omnicef [Cefdinir] Rash     Home Medications  Prior to Admission medications   Medication Sig Start Date End Date Taking? Authorizing Provider  acetaminophen (TYLENOL) 500 MG tablet Take 500 mg by mouth 2 (two) times daily as needed for headache.   Yes [provider]  albuterol (VENTOLIN HFA) 108 (90 Base) MCG/ACT inhaler INHALE TWO PUFFS INTO LUNGS EVERY 4 TO 6 HOURS AS NEEDED FOR SHORTNESS OF BREATH/WHEEZING. Patient taking differently: Inhale 2 puffs into the lungs every 4 (four) hours as needed for wheezing or shortness of breath. 07/26/21  Yes Martyn Ehrich, NP  ALPRAZolam Duanne Moron) 0.5 MG tablet Take 1 tablet (0.5 mg total) by mouth 3 (three) times daily as needed for anxiety. 09/05/21  10/05/21 Yes Nafziger, Tommi Rumps, NP  aspirin EC 81 MG EC tablet Take 1 tablet (81 mg total) by mouth daily. Patient taking differently: Take 81 mg by mouth 3 days. 07/08/15  Yes Nita Sells, MD  butalbital-acetaminophen-caffeine (FIORICET) 50-325-40 MG tablet Take 1 tablet by mouth every 6 (six) hours as needed for headache. 09/05/21 10/05/21 Yes Nafziger, Tommi Rumps,  NP  Calcium Carb-Cholecalciferol (CALCIUM 600 + D PO) Take 600 mg by mouth in the morning and at bedtime. Calcium 600 mg + 4000 unit D3   Yes [provider]  cetirizine (ZYRTEC) 10 MG tablet Take 10 mg by mouth daily.   Yes [provider]  Cholecalciferol (VITAMIN D3 PO) Take 5,000 Units by mouth daily.   Yes [provider]  furosemide (LASIX) 40 MG tablet Take 1 tablet (40 mg total) by mouth daily as needed (for an overnight weight gain of 3-5 pounds of fluid). 08/16/21  Yes [name], MD  Ginkgo Biloba Extract 60 MG CAPS Take 1 capsule by mouth daily. 08/29/21  Yes [provider]  HYDROcodone-acetaminophen (NORCO) 7.5-325 MG tablet Take 1 tablet by mouth every 6 (six) hours as needed for moderate pain. 08/29/21  Yes Ghimire, Henreitta Leber, MD  ipratropium-albuterol (DUONEB) 0.5-2.5 (3) MG/3ML SOLN USE 3 ML VIA NEBULIZER EVERY 6 HOURS AS NEEDED.  DX: J44.9 Patient taking differently: Take 3 mLs by nebulization. 3 ml nebulization five times a day as needed for shortness of breath 07/09/21  Yes Margaretha Seeds, MD  lidocaine (LIDODERM) 5 % Place 1 patch onto the skin daily. Remove & Discard patch within 12 hours or as directed by MD Patient taking differently: Place 1 patch onto the skin every other day. Remove & Discard patch within 12 hours or as directed by MD 08/29/21  Yes Ghimire, Henreitta Leber, MD  losartan (COZAAR) 25 MG tablet Take 1 tablet (25 mg total) by mouth daily. 04/26/21  Yes Weaver, Scott T, PA-C  magnesium oxide (MAG-OX) 400 MG tablet Take 400 mg by mouth daily.   Yes [provider]  metoprolol succinate (TOPROL-XL) 25 MG 24 hr tablet Take 0.5 tablets (12.5 mg total) by mouth daily. 01/04/21  Yes [name], MD  MUCINEX MAXIMUM STRENGTH 1200 MG TB12 Take 1,200 mg by mouth every 12 (twelve) hours.   Yes [provider]  nitroGLYCERIN (NITROSTAT) 0.4 MG SL tablet Place 1 tablet (0.4 mg total) under the tongue every 5 (five) minutes as needed for chest pain. 10/24/20  Yes [name], MD  nystatin (MYCOSTATIN) 100000 UNIT/ML suspension Take 5 mLs by mouth 3 (three) times daily as needed (thrush). 08/05/21  Yes [provider]  Olopatadine HCl (PATADAY OP) Place 1 drop into the left eye daily.   Yes [provider]  ondansetron (ZOFRAN) 4 MG tablet Take 1 tablet (4 mg total) by mouth every 8 (eight) hours as needed for nausea or vomiting. 09/05/21  Yes Nafziger, Tommi Rumps, NP  OXYGEN Inhale 2 L/min into the lungs continuous.   Yes [provider]  PARoxetine (PAXIL) 30 MG tablet Take 1 tablet (30 mg total) by mouth daily. 06/11/21  Yes Nafziger, Tommi Rumps, NP  potassium chloride (KLOR-CON) 10 MEQ tablet Take 1 tablet (10 mEq total) by mouth as needed (take with furosemide (lasix)). 09/12/21  Yes Swinyer, Lanice Schwab, NP  predniSONE (DELTASONE) 10 MG tablet Take 1 tablet (10 mg total) by mouth daily. 09/04/21  Yes [name], MD  Spacer/Aero Chamber Mouthpiece MISC 1 Device by Does not apply route as directed. 01/12/17  Yes Javier Glazier, MD  SYMBICORT 160-4.5 MCG/ACT inhaler INHALE 2 PUFFS TWICE DAILY Patient taking differently: 2 puffs 2 (two) times daily. 12/31/20  Yes Martyn Ehrich, NP  Tiotropium Bromide Monohydrate (SPIRIVA RESPIMAT) 2.5 MCG/ACT AERS Inhale 2 puffs into the lungs daily. Patient taking differently: Inhale 2 puffs  into the lungs daily as needed (wheezing). 12/06/20  Yes Martyn Ehrich, NP     Critical care time:     Gaylyn Lambert ACNP Acute Care Nurse Practitioner [facility]/Critical  Care Please consult Amion 09/17/2021, 12:04 PM

## 2021-09-17 NOTE — H&P (Signed)
History and Physical    Patient: Kim [name] SJG:[id number] DOB: Jul 27, 1955 DOA: 09/17/2021 DOS: the patient was seen and examined on 09/17/2021 PCP: [name], NP  Patient coming from: Home - lives with husband; NOK: Lucyann, [place], [phone]   Chief Complaint: SOB  HPI: Kim [name] is a 66 y.o. female with medical history significant of COPD; HLD; and depression presenting with SOB. She was last hospitalized from 5/22-25.  At that time, she had a syncopal event with SDH and was discharged to [facility].  She was unhappy there and returned home shortly thereafter.   She is on nocturnal BIPAP.  She saw Dr. Elsworth Soho on 5/31 and her PCP on 6/1.  She saw cardiology on 6/8 and reported periodic residual dizziness and headaches.  She has a Zio patch now to assess dizziness.  She felt well yesterday and was given an infusion of Reclast.  Whether related or not, she awoke overnight profoundly SOB.  She checked her O2 sat on BIPAP and it was 90%.  She asked for Xanax and a neb treatment and called 911.  She was placed on BIPAP in the ER and was off BIPAP and on [place] O2 at the time of my evaluation.  She had persistent tachypnea and increased WOB but had moderate air movement.  No significant change in cough.  No fever.  She acknowledges advanced COPD and is aware of her ongoing decline.    ER Course:  COPD exacerbation.  Awoke SOB overnight.  Got a shot for osteoporosis yesterday.  Admitted last month and had Takotsubo.  Troponin 42 (previously 600).  CXR without PNA.  Gas improved, weaning off BIPAP.     Review of Systems: As mentioned in the history of present illness. All other systems reviewed and are negative. 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   OSTEOARTHRITIS 06/05/2009   PARESTHESIA 08/24/2007   TOBACCO ABUSE 01/25/2010    Past Surgical History:  Procedure Laterality Date   ABDOMINAL HYSTERECTOMY     APPENDECTOMY  1969   CARDIAC CATHETERIZATION N/A 07/05/2015   Procedure: Left Heart Cath and Coronary Angiography;  Surgeon: Leonie Man, MD;  Location: [facility];  Service: Cardiovascular;  Laterality: N/A;   CHOLECYSTECTOMY  1989   collapse lung  1990   COLONOSCOPY WITH PROPOFOL N/A 05/14/2018   Procedure: COLONOSCOPY WITH PROPOFOL;  Surgeon: Irene Shipper, MD;  Location: [facility];  Service: Endoscopy;  Laterality: N/A;   EXCISION MASS ABDOMINAL  2019   EXPLORATORY LAPAROTOMY  1969   LEFT HEART CATH AND CORONARY ANGIOGRAPHY N/A 11/19/2020   Procedure: LEFT HEART CATH AND CORONARY ANGIOGRAPHY;  Surgeon: [name], MD;  Location: [facility];  Service: Cardiovascular;  Laterality: N/A;   POLYPECTOMY  05/14/2018   Procedure: POLYPECTOMY;  Surgeon: Irene Shipper, MD;  Location: [facility];  Service: Endoscopy;;   SALPINGOOPHORECTOMY  2019   Social History:  reports that she quit smoking about 7 years ago. Her smoking use included cigarettes. She has a 76.00 pack-year smoking history. She has never used smokeless tobacco. She reports that she does not drink alcohol and does not use drugs.  Allergies  Allergen Reactions   Tape Other (See Comments)    Skin tears easily, peels skin off   Daliresp [Roflumilast]     Skin peeling  Latex Other (See Comments)    Peels skin off   Lisinopril Cough   Morphine And Related     hallucinations   Penicillins Hives and Other (See Comments)    Tolerates Omnicef Has patient had a PCN reaction causing immediate rash, facial/tongue/throat swelling, SOB or lightheadedness with hypotension: Yes Has patient had a PCN reaction causing severe rash involving mucus membranes or skin necrosis: No Has patient had a PCN reaction that required hospitalization No Has patient had a PCN reaction occurring within the last 10 years: No If all of the above  answers are "NO", then may proceed with Cephalosporin use.    Omnicef [Cefdinir] Rash    Family History  Problem Relation Age of Onset   Cancer Mother        lung   Heart attack Mother    Hypertension Sister    Multiple sclerosis Brother    Diabetes Sister    Rheum arthritis Sister    Thyroid disease Sister    Multiple sclerosis Other    Alcohol abuse Other    Arthritis Other    Diabetes Other    Kidney disease Other    Cancer Other        lung,ovarian,skin, uterine   Stroke Other    Heart disease Other    Melanoma Other    Osteoporosis Other    Heart attack Maternal Uncle    Heart attack Other        NEICE   Heart attack Other        NEPHEW   Hypertension Brother    Stroke Maternal Grandmother    Colon cancer Neg Hx     Prior to Admission medications   Medication Sig Start Date End Date Taking? Authorizing Provider  acetaminophen (TYLENOL) 500 MG tablet Take 500 mg by mouth 2 (two) times daily as needed for headache.   Yes [provider]  albuterol (VENTOLIN HFA) 108 (90 Base) MCG/ACT inhaler INHALE TWO PUFFS INTO LUNGS EVERY 4 TO 6 HOURS AS NEEDED FOR SHORTNESS OF BREATH/WHEEZING. Patient taking differently: Inhale 2 puffs into the lungs every 4 (four) hours as needed for wheezing or shortness of breath. 07/26/21  Yes Martyn Ehrich, NP  ALPRAZolam Duanne Moron) 0.5 MG tablet Take 1 tablet (0.5 mg total) by mouth 3 (three) times daily as needed for anxiety. 09/05/21 10/05/21 Yes Nafziger, Tommi Rumps, NP  aspirin EC 81 MG EC tablet Take 1 tablet (81 mg total) by mouth daily. Patient taking differently: Take 81 mg by mouth 3 days. 07/08/15  Yes Nita Sells, MD  butalbital-acetaminophen-caffeine (FIORICET) 50-325-40 MG tablet Take 1 tablet by mouth every 6 (six) hours as needed for headache. 09/05/21 10/05/21 Yes Nafziger, Tommi Rumps, NP  Calcium Carb-Cholecalciferol (CALCIUM 600 + D PO) Take 600 mg by mouth in the morning and at bedtime. Calcium 600 mg + 4000 unit D3   Yes  [provider]  cetirizine (ZYRTEC) 10 MG tablet Take 10 mg by mouth daily.   Yes [provider]  Cholecalciferol (VITAMIN D3 PO) Take 5,000 Units by mouth daily.   Yes [provider]  furosemide (LASIX) 40 MG tablet Take 1 tablet (40 mg total) by mouth daily as needed (for an overnight weight gain of 3-5 pounds of fluid). 08/16/21  Yes [name], MD  Ginkgo Biloba Extract 60 MG CAPS Take 1 capsule by mouth daily. 08/29/21  Yes [provider]  HYDROcodone-acetaminophen (NORCO) 7.5-325 MG tablet Take 1 tablet by mouth every 6 (six)  hours as needed for moderate pain. 08/29/21  Yes Ghimire, Henreitta Leber, MD  ipratropium-albuterol (DUONEB) 0.5-2.5 (3) MG/3ML SOLN USE 3 ML VIA NEBULIZER EVERY 6 HOURS AS NEEDED.  DX: J44.9 Patient taking differently: Take 3 mLs by nebulization. 3 ml nebulization five times a day as needed for shortness of breath 07/09/21  Yes Margaretha Seeds, MD  lidocaine (LIDODERM) 5 % Place 1 patch onto the skin daily. Remove & Discard patch within 12 hours or as directed by MD Patient taking differently: Place 1 patch onto the skin every other day. Remove & Discard patch within 12 hours or as directed by MD 08/29/21  Yes Ghimire, Henreitta Leber, MD  losartan (COZAAR) 25 MG tablet Take 1 tablet (25 mg total) by mouth daily. 04/26/21  Yes Weaver, Scott T, PA-C  magnesium oxide (MAG-OX) 400 MG tablet Take 400 mg by mouth daily.   Yes [provider]  metoprolol succinate (TOPROL-XL) 25 MG 24 hr tablet Take 0.5 tablets (12.5 mg total) by mouth daily. 01/04/21  Yes [name], MD  MUCINEX MAXIMUM STRENGTH 1200 MG TB12 Take 1,200 mg by mouth every 12 (twelve) hours.   Yes [provider]  nitroGLYCERIN (NITROSTAT) 0.4 MG SL tablet Place 1 tablet (0.4 mg total) under the tongue every 5 (five) minutes as needed for chest pain. 10/24/20  Yes [name], MD  nystatin (MYCOSTATIN) 100000 UNIT/ML suspension Take 5  mLs by mouth 3 (three) times daily as needed (thrush). 08/05/21  Yes [provider]  Olopatadine HCl (PATADAY OP) Place 1 drop into the left eye daily.   Yes [provider]  ondansetron (ZOFRAN) 4 MG tablet Take 1 tablet (4 mg total) by mouth every 8 (eight) hours as needed for nausea or vomiting. 09/05/21  Yes Nafziger, Tommi Rumps, NP  OXYGEN Inhale 2 L/min into the lungs continuous.   Yes [provider]  PARoxetine (PAXIL) 30 MG tablet Take 1 tablet (30 mg total) by mouth daily. 06/11/21  Yes Nafziger, Tommi Rumps, NP  potassium chloride (KLOR-CON) 10 MEQ tablet Take 1 tablet (10 mEq total) by mouth as needed (take with furosemide (lasix)). 09/12/21  Yes Swinyer, Lanice Schwab, NP  predniSONE (DELTASONE) 10 MG tablet Take 1 tablet (10 mg total) by mouth daily. 09/04/21  Yes [name], MD  Spacer/Aero Chamber Mouthpiece MISC 1 Device by Does not apply route as directed. 01/12/17  Yes Javier Glazier, MD  SYMBICORT 160-4.5 MCG/ACT inhaler INHALE 2 PUFFS TWICE DAILY Patient taking differently: 2 puffs 2 (two) times daily. 12/31/20  Yes Martyn Ehrich, NP  Tiotropium Bromide Monohydrate (SPIRIVA RESPIMAT) 2.5 MCG/ACT AERS Inhale 2 puffs into the lungs daily. Patient taking differently: Inhale 2 puffs into the lungs daily as needed (wheezing). 12/06/20  Yes Martyn Ehrich, NP  azithromycin (ZITHROMAX) 250 MG tablet Take 1 tablet (250 mg total) by mouth daily. Patient not taking: Reported on 09/17/2021 09/04/21   [name], MD    Physical Exam: Vitals:   09/17/21 0845 09/17/21 0900 09/17/21 0930 09/17/21 1000  BP: (!) '93/58 91/66 97/71 '$ 92/63  Pulse: 91 87 94 90  Resp: (!) 27 (!) 22 19 (!) 22  Temp:      TempSrc:      SpO2: 97% 96% 96% 96%  Weight:      Height:       General:  Appears calm and comfortable and is in NAD; increased respiratory effort but very conversant despite this Eyes:   EOMI, normal  lids, iris ENT:  grossly normal hearing, lips & tongue, mmm Neck:  no  LAD, masses or thyromegaly Cardiovascular:  RRR, no m/r/g. Tr LE edema.  Respiratory:   Scattered wheezes, reasonable air movement.  Mildly to moderately increased respiratory effort.  On 2L [place] O2 Abdomen:  soft, NT, ND Skin:  no rash or induration seen on limited exam Musculoskeletal:  grossly normal tone BUE/BLE, good ROM, no bony abnormality Psychiatric:  grossly normal/mildly anxious mood and affect, speech fluent and appropriate, AOx3 Neurologic:  CN 2-12 grossly intact, moves all extremities in coordinated fashion   Radiological Exams on Admission: Independently reviewed - see discussion in A/P where applicable  DG Chest Port 1 View  Result Date: 09/17/2021 CLINICAL DATA:  66 year old female with shortness of breath, respiratory distress. EXAM: PORTABLE CHEST 1 VIEW COMPARISON:  Portable chest 08/26/2021 and earlier. Chest CT 08/28/2021 and earlier. FINDINGS: Centrilobular emphysema better demonstrated by CT last month. Portable AP semi upright view at 0451 hours. Stable lung volumes and mediastinal contours. No pneumothorax, pulmonary edema, pleural effusion, or confluent pulmonary opacity. 5 cm generator type device newly projects over the left upper chest, but no connected leads are identified. EKG leads elsewhere. Visualized tracheal air column is within normal limits. No acute osseous abnormality identified. IMPRESSION: Emphysema.  No acute cardiopulmonary abnormality. Electronically Signed   By: Genevie Ann M.D.   On: 09/17/2021 05:39    EKG: Independently reviewed.  Sinus tachycardia with rate 107; IVCD; LVH; nonspecific ST changes    Labs on Admission: I have personally reviewed the available labs and imaging studies at the time of the admission.  Pertinent labs:    ABG: 7.271/51.3/132/23.9 -> 7.345/44.4/133/24.5 Glucose 325 BUN 19/Creatinine 1.1/GFR 55 BNP 175 HS troponin 42, 272 WBC 21.3 Hgb 11.9 COVID/flu negative   Assessment and Plan: Principal Problem:   COPD with  acute exacerbation (HCC) Active Problems:   Elevated troponin   Chronic combined systolic (congestive) and diastolic (congestive) heart failure (HCC)   Subarachnoid hemorrhage (HCC)   Anxiety and depression   Essential hypertension   DNR (do not resuscitate)    Acute on chronic respiratory failure associated with a COPD exacerbation -Patient with advanced COPD, no longer smoking -She is on nocturnal BIPAP and had acute SOB overnight with hypoxia to 90% despite BIPAP -This occurred after reclast infusion yesterday - possibly related but not clearly -Regardless, she remains with a tenuous respiratory status, persistently increased respiratory effort -CXR negative for PNA -Will consult pulmonology -will admit patient to progressive care; she appears to be tenuous and at risk for further need for BIPAP (in addition to nocturnal BIPAP) -Nebulizers: scheduled Duoneb and prn albuterol -Solu-Medrol 80 mg IV BID -> Prednisone 40 mg PO daily (she is usually on 10 mg daily) -No cardinal symptoms, no antibiotics at this time -Continue Zyrtec (Claritin per formulary), Symbicort (Dulera per formulary) -Hold Spiriva -Coordinated care with [facility] team/PT/OT/Nutrition/RT consults -She may benefit from palliative care consultation  Elevated troponin -Patient with h/o Takotsubo -Negative cath in 11/2020 -Likely related to Takotsubo and/or demand ischemia -Will continue to trend troponin to peak -Cardiology consulted -Continue ASA  Chronic combined CHF -08/26/21 echo with EF 35-40% and grade 1 diastolic dysfunction and WMA suggestive of atypical stress cardiomyopathy -Appears to be compensated from this standpoint currently -Hold Lasix -Currently wearing a Zio patch for dizziness  Recent SAH/SDH -Has ongoing dizziness, headaches -Will repeat head CT to ensure resolution  HTN -Continue Toprol XL  Anxiety/depression -Continue paroxetine, Xanax  Chronic  pain -I have reviewed this patient in  the [place] Controlled Substances Reporting System.  She is receiving medications from only one provider and appears to be taking them as prescribed. -She is at high risk of opioid misuse, diversion, or overdose.  -Continue Norco  DNR -I have discussed code status with the patient and she would not desire resuscitation and would prefer to die a natural death should that situation arise. -She will need a gold out of facility DNR form at the time of discharge      Advance Care Planning:   Code Status: DNR   Consults: Pulmonology; TOC team/PT/OT/Nutrition/RT consults  DVT Prophylaxis: SCDs (Hold AC given recent head bleed)  Family Communication: Husband was present for the majority of the evaluation  Severity of Illness: The appropriate patient status for this patient is INPATIENT. Inpatient status is judged to be reasonable and necessary in order to provide the required intensity of service to ensure the patient's safety. The patient's presenting symptoms, physical exam findings, and initial radiographic and laboratory data in the context of their chronic comorbidities is felt to place them at high risk for further clinical deterioration. Furthermore, it is not anticipated that the patient will be medically stable for discharge from the hospital within 2 midnights of admission.   * I certify that at the point of admission it is my clinical judgment that the patient will require inpatient hospital care spanning beyond 2 midnights from the point of admission due to high intensity of service, high risk for further deterioration and high frequency of surveillance required.*  Author: Karmen Bongo, MD 09/17/2021 12:21 PM  For on call review [email or web address].

## 2021-09-17 NOTE — ED Notes (Signed)
Removed patient from CPAP and palced on 4L O2 via [place]. Pt tolerating well and titrated back to her normal 2L home O2.

## 2021-09-18 DIAGNOSIS — I5042 Chronic combined systolic (congestive) and diastolic (congestive) heart failure: Secondary | ICD-10-CM | POA: Diagnosis not present

## 2021-09-18 DIAGNOSIS — J9602 Acute respiratory failure with hypercapnia: Secondary | ICD-10-CM

## 2021-09-18 DIAGNOSIS — J9601 Acute respiratory failure with hypoxia: Secondary | ICD-10-CM | POA: Diagnosis not present

## 2021-09-18 DIAGNOSIS — J441 Chronic obstructive pulmonary disease with (acute) exacerbation: Secondary | ICD-10-CM | POA: Diagnosis not present

## 2021-09-18 DIAGNOSIS — J9611 Chronic respiratory failure with hypoxia: Secondary | ICD-10-CM

## 2021-09-18 DIAGNOSIS — R7989 Other specified abnormal findings of blood chemistry: Secondary | ICD-10-CM | POA: Diagnosis not present

## 2021-09-18 LAB — BASIC METABOLIC PANEL
Anion gap: 10 (ref 5–15)
BUN: 19 mg/dL (ref 8–23)
CO2: 24 mmol/L (ref 22–32)
Calcium: 8.6 mg/dL — ABNORMAL LOW (ref 8.9–10.3)
Chloride: 106 mmol/L (ref 98–111)
Creatinine, Ser: 0.78 mg/dL (ref 0.44–1.00)
GFR, Estimated: >60 mL/min (ref >60)
Glucose, Bld: 142 mg/dL — ABNORMAL HIGH (ref 70–99)
Potassium: 4.4 mmol/L (ref 3.5–5.1)
Sodium: 140 mmol/L (ref 135–145)

## 2021-09-18 LAB — CBC
HCT: 33.1 % — ABNORMAL LOW (ref 36.0–46.0)
Hemoglobin: 10.6 g/dL — ABNORMAL LOW (ref 12.0–15.0)
MCH: 31.4 pg (ref 26.0–34.0)
MCHC: 32 g/dL (ref 30.0–36.0)
MCV: 97.9 fL (ref 80.0–100.0)
Platelets: 252 10*3/uL (ref 150–400)
RBC: 3.38 MIL/uL — ABNORMAL LOW (ref 3.87–5.11)
RDW: 15 % (ref 11.5–15.5)
WBC: 15 10*3/uL — ABNORMAL HIGH (ref 4.0–10.5)
nRBC: 0 % (ref 0.0–0.2)

## 2021-09-18 LAB — PATHOLOGIST SMEAR REVIEW

## 2021-09-18 MED ORDER — BISOPROLOL FUMARATE 5 MG PO TABS: 2.5000 mg | ORAL_TABLET | Freq: Every day | ORAL | 0 refills | Status: DC

## 2021-09-18 MED ORDER — PREDNISONE 20 MG PO TABS: 20.0000 mg | ORAL_TABLET | Freq: Every day | ORAL | 0 refills | Status: DC

## 2021-09-18 MED ORDER — PREDNISONE 10 MG PO TABS: 10.0000 mg | ORAL_TABLET | Freq: Every day | ORAL | 2 refills | Status: DC

## 2021-09-18 MED ORDER — BISOPROLOL FUMARATE 5 MG PO TABS
2.5000 mg | ORAL_TABLET | Freq: Every day | ORAL | Status: DC
  Administered 2021-09-18: 2.5 mg via ORAL
  Filled 2021-09-18: qty 1

## 2021-09-18 NOTE — Progress Notes (Addendum)
Progress Note  Patient Name: KIMYAH FREIN Date of Encounter: 09/18/2021  [facility] Cardiologist: Lauree Chandler, MD   Subjective   No recurrent chest pain or dyspnea. She think her symptoms was due to COPD.   Inpatient Medications    Scheduled Meds:  aspirin EC  81 mg Oral 3 days   docusate sodium  100 mg Oral BID   guaiFENesin  1,200 mg Oral Q12H   ipratropium-albuterol  3 mL Nebulization Q6H   loratadine  10 mg Oral Daily   magnesium oxide  400 mg Oral Daily   mometasone-formoterol  2 puff Inhalation BID   olopatadine  1 drop Left Eye Daily   PARoxetine  30 mg Oral Daily   predniSONE  40 mg Oral Q breakfast   sodium chloride flush  3 mL Intravenous Q12H   Continuous Infusions:  PRN Meds: acetaminophen **OR** acetaminophen, albuterol, ALPRAZolam, bisacodyl, hydrALAZINE, HYDROcodone-acetaminophen, ondansetron **OR** ondansetron (ZOFRAN) IV, polyethylene glycol, zolpidem   Vital Signs    Vitals:   09/18/21 0511 09/18/21 0730 09/18/21 0732 09/18/21 0800  BP: (!) 112/93     Pulse: 89  96   Resp: (!) 22     Temp: 97.6 F (36.4 C)  97.9 F (36.6 C) 97.9 F (36.6 C)  TempSrc: Axillary  Oral Oral  SpO2: 100% 97% 97%   Weight:      Height:        Intake/Output Summary (Last 24 hours) at 09/18/2021 0954 Last data filed at 09/18/2021 0919 Gross per 24 hour  Intake 1240 ml  Output 1150 ml  Net 90 ml      09/17/2021    4:21 PM 09/17/2021    4:49 AM 09/16/2021    1:04 PM  Last 3 Weights  Weight (lbs) 144 lb 12.8 oz 142 lb 13.7 oz 142 lb 12.8 oz  Weight (kg) 65.681 kg 64.8 kg 64.774 kg      Telemetry    Sinus tachycardia  - Personally Reviewed  ECG    N/A  Physical Exam   GEN: No acute distress.   Neck: No JVD Cardiac: regular tachycardia, no murmurs, rubs, or gallops.  Respiratory: Clear to auscultation bilaterally. GI: Soft, nontender, non-distended  MS: No edema; No deformity. Neuro:  Nonfocal  Psych: Normal affect   Labs    High  Sensitivity Troponin:   Recent Labs  Lab 08/26/21 1543 09/17/21 0444 09/17/21 0633 09/17/21 1230 09/17/21 1408  TROPONINIHS 676* 42* 272* 445* 434*     Chemistry Recent Labs  Lab 09/17/21 0444 09/17/21 0457 09/17/21 0500 09/17/21 0637 09/18/21 0109  NA 140   < > 138 141 140  K 4.7   < > 4.5 3.9 4.4  CL 106  --  105  --  106  CO2 23  --   --   --  24  GLUCOSE 325*  --  324*  --  142*  BUN 19  --  23  --  19  CREATININE 1.10*  --  1.10*  --  0.78  CALCIUM 8.9  --   --   --  8.6*  PROT 5.6*  --   --   --   --   ALBUMIN 3.4*  --   --   --   --   AST 19  --   --   --   --   ALT 16  --   --   --   --   ALKPHOS 90  --   --   --   --  BILITOT 0.4  --   --   --   --   GFRNONAA 55*  --   --   --  >60  ANIONGAP 11  --   --   --  10   < > = values in this interval not displayed.    Lipids No results for input(s): "CHOL", "TRIG", "HDL", "LABVLDL", "LDLCALC", "CHOLHDL" in the last 168 hours.  Hematology Recent Labs  Lab 09/17/21 0444 09/17/21 0457 09/17/21 0500 09/17/21 0637 09/18/21 0109  WBC 21.3*  --   --   --  15.0*  RBC 3.75*  --   --   --  3.38*  HGB 11.9*   < > 11.9* 11.6* 10.6*  HCT 38.6   < > 35.0* 34.0* 33.1*  MCV 102.9*  --   --   --  97.9  MCH 31.7  --   --   --  31.4  MCHC 30.8  --   --   --  32.0  RDW 14.6  --   --   --  15.0  PLT 310  --   --   --  252   < > = values in this interval not displayed.   Thyroid No results for input(s): "TSH", "FREET4" in the last 168 hours.  BNP Recent Labs  Lab 09/17/21 0444  BNP 175.0*    DDimer No results for input(s): "DDIMER" in the last 168 hours.   Radiology    CT HEAD WO CONTRAST (5MM)  Result Date: 09/17/2021 CLINICAL DATA:  Dizziness EXAM: CT HEAD WITHOUT CONTRAST TECHNIQUE: Contiguous axial images were obtained from the base of the skull through the vertex without intravenous contrast. RADIATION DOSE REDUCTION: This exam was performed according to the departmental dose-optimization program which includes  automated exposure control, adjustment of the mA and/or kV according to patient size and/or use of iterative reconstruction technique. COMPARISON:  08/26/2021 FINDINGS: Brain: No acute intracranial findings are seen in noncontrast CT brain. There is interval resolution of small foci of subarachnoid hemorrhage adjacent to the falx. There are no signs of recent bleeding within the cranium. Cortical sulci are prominent. Vascular: Unremarkable. Skull: Unremarkable. Sinuses/Orbits: Unremarkable. Other: None. IMPRESSION: No acute intracranial findings are seen in noncontrast CT brain. There is interval resolution of small foci of subarachnoid hemorrhage adjacent to the falx since 08/26/2021. atrophy. Electronically Signed   By: Elmer Picker M.D.   On: 09/17/2021 13:20   DG Chest Port 1 View  Result Date: 09/17/2021 CLINICAL DATA:  66 year old female with shortness of breath, respiratory distress. EXAM: PORTABLE CHEST 1 VIEW COMPARISON:  Portable chest 08/26/2021 and earlier. Chest CT 08/28/2021 and earlier. FINDINGS: Centrilobular emphysema better demonstrated by CT last month. Portable AP semi upright view at 0451 hours. Stable lung volumes and mediastinal contours. No pneumothorax, pulmonary edema, pleural effusion, or confluent pulmonary opacity. 5 cm generator type device newly projects over the left upper chest, but no connected leads are identified. EKG leads elsewhere. Visualized tracheal air column is within normal limits. No acute osseous abnormality identified. IMPRESSION: Emphysema.  No acute cardiopulmonary abnormality. Electronically Signed   By: Genevie Ann M.D.   On: 09/17/2021 05:39    Cardiac Studies   N/A  Patient Profile     66 y.o. 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 Lorin Mercy.  Assessment & Plan   Elevated troponin Peaked at 445 (usually in 600s on prior admission).  Likely demand in setting of acute COPD exacerbation.  Prior cardiac cath in 2015, 2017 and most recently August 2022 without evidence of obstructive CAD.  Patient denies any chest pain.   2.  Recurrent stress induced cardiomyopathy -Dating back to 2015, most recently 08/2021 after fall and COPD exacerbation.Echo that with  with reduced LV function at 35 to 40% with regional wall motion abnormality consistent with atypical stress induced cardiomyopathy. -Guideline directed medical therapy limited secondary to hypotension   3.  Acute on chronic respiratory failure secondary to COPD exacerbation -Per primary team/pulmonary   4.  Recent syncope and dizziness -Currently wearing ZIO monitor -Pending carotid Doppler  Will sign off. Call with question. Medication recommendations per MD.   For questions or updates, please contact [place] Please consult [email or web address] for contact info under        SignedLeanor Kail, PA  09/18/2021, 9:54 AM     Personally seen and examined. Agree with APP above with the following comments:  Briefly [name] with COPD and Stress induced cardiomyopathy with acute hypoxic respiratory failure.  Patient notes that she feels much better on her present therapy.  Last breathing treatment 7 AM but no wheezes on exam No CP, no Palpitations, No syncope.  Planning to send the heart monitor today  Labs notable for Hs troponin peak 445; has had multiple LHC in the past for Takotsubo Tele: SR to sinus tachycardia  Would recommend  - prior fall and SDH, sending monitor today is ok; keep on telemetry while here - given the higher selectivity of biosprolol and her significant COPD  will switch to 2.5 mg bisoprolol - rest as above - we are working to arrange f/u - no further tests -  medication change metoprolol to bisoprolol   Rudean Haskell, MD Cardiologist [facility] [place]  [address] [place], [place] [address] [phone]  10:27 AM

## 2021-09-18 NOTE — Consult Note (Addendum)
   [facility] CM Inpatient Consult   09/18/2021  OLINE BELK January 07, 1956 [id number]  [place] [facility] [ACO] Patient: Medicare ACO REACH  Primary Care Provider:  [name], NP with [facility], is an embedded provider with a Chronic Care Management team and program, and is listed for the transition of care follow up and appointments.  Patient was screened assessed for Embedded practice service needs for chronic care management and she has recently become active with the Embedded Care Management RNCM.    Met with patient and spouse at the bedside.  Patient endorses PCP and CCM team.  She states she has great follow up with all of her physicians and feels she has well rounded care.  She states she will speak with her CCM RN team member regarding ongoing needs, if any. Patient is for transitioning home today.  Her follow up is for Vestibular rehab. Updated inpatient RNCM on rounding visit.  Addendum:  Gave patient an appointment reminder card and 24 hour nurse advise line magnet.  Plan: Notification to be sent to the [place] [facility] team member for any new TOC for post hospital care needs. Please contact for further questions,  Natividad Brood, RN BSN [facility] Liaison  [phone] business mobile phone Toll free office [phone]  Fax number: [phone] [email or web address]

## 2021-09-18 NOTE — Plan of Care (Signed)
  Problem: Education: Goal: Knowledge of disease or condition will improve Outcome: Adequate for Discharge Goal: Knowledge of the prescribed therapeutic regimen will improve Outcome: Adequate for Discharge Goal: Individualized Educational Video(s) Outcome: Adequate for Discharge   Problem: Activity: Goal: Ability to tolerate increased activity will improve Outcome: Adequate for Discharge Goal: Will verbalize the importance of balancing activity with adequate rest periods Outcome: Adequate for Discharge   Problem: Respiratory: Goal: Ability to maintain a clear airway will improve Outcome: Adequate for Discharge Goal: Levels of oxygenation will improve Outcome: Adequate for Discharge Goal: Ability to maintain adequate ventilation will improve Outcome: Adequate for Discharge   Problem: Education: Goal: Knowledge of General Education information will improve Description: Including pain rating scale, medication(s)/side effects and non-pharmacologic comfort measures Outcome: Adequate for Discharge   Problem: Health Behavior/Discharge Planning: Goal: Ability to manage health-related needs will improve Outcome: Adequate for Discharge   Problem: Clinical Measurements: Goal: Ability to maintain clinical measurements within normal limits will improve Outcome: Adequate for Discharge Goal: Will remain free from infection Outcome: Adequate for Discharge Goal: Diagnostic test results will improve Outcome: Adequate for Discharge Goal: Respiratory complications will improve Outcome: Adequate for Discharge Goal: Cardiovascular complication will be avoided Outcome: Adequate for Discharge   Problem: Activity: Goal: Risk for activity intolerance will decrease Outcome: Adequate for Discharge   Problem: Nutrition: Goal: Adequate nutrition will be maintained Outcome: Adequate for Discharge   Problem: Coping: Goal: Level of anxiety will decrease Outcome: Adequate for Discharge    Problem: Elimination: Goal: Will not experience complications related to bowel motility Outcome: Adequate for Discharge Goal: Will not experience complications related to urinary retention Outcome: Adequate for Discharge   Problem: Pain Managment: Goal: General experience of comfort will improve Outcome: Adequate for Discharge   Problem: Safety: Goal: Ability to remain free from injury will improve Outcome: Adequate for Discharge   Problem: Skin Integrity: Goal: Risk for impaired skin integrity will decrease Outcome: Adequate for Discharge   Problem: Acute Rehab PT Goals(only PT should resolve) Goal: Pt Will Ambulate Outcome: Adequate for Discharge Goal: Pt Will Go Up/Down Stairs Outcome: Adequate for Discharge

## 2021-09-18 NOTE — Evaluation (Signed)
Physical Therapy Evaluation Patient Details Name: Kim [name] MRN: [id number] DOB: Feb 28, 1956 Today's Date: 09/18/2021  History of Present Illness  Pt is a 66 y.o. female admitted 09/17/21 with SOB, recurrent dizziness and syncope. Workup for COPD exacerbation. Of note, recent admission 08/2021 with syncope and fall, trace SAH, cardiac monitor placed. Other PMH includes COPD (2-3L O2 baseline), HTN, recurrent stress-induced cardiomyopathy, osteoporosis, anxiety, depression.   Clinical Impression  Pt presents with an overall decrease in functional mobility secondary to above. PTA, pt mod indep, limited ambulator secondary to DOE and dizziness, lives with supportive husband. Today, pt moving fairly well at supervision-level; limited by DOE, but notes improvement since PTA. Pt demonstrates good awareness of activity pacing, energy conservation strategies and importance of mobility.  Educ on precautions, positioning, therex, and importance of mobility. Pt notes significant improvement in dizziness symptoms after vestibular PT eval/treatment last admission (after [facility] 08/2021), may not be able to get vestibular PT tx this admission based on census, but recommend follow-up with OP vestibular PT; pt reports understanding.    Orthostatic BPs Supine 141/68  Sitting 143/77  Standing 132/66 *pt c/o lightheadedness  Standing after 2 min 131/70  Post-ambulation 152/81    SpO2 100% on 2L O2 [place], HR 100s-110s   Recommendations for follow up therapy are one component of a multi-disciplinary discharge planning process, led by the attending physician.  Recommendations may be updated based on patient status, additional functional criteria and insurance authorization.  Follow Up Recommendations Outpatient PT (vestibular)    Assistance Recommended at Discharge Intermittent Supervision/Assistance  Patient can return home with the following  A little help with bathing/dressing/bathroom;Assistance with  cooking/housework;Assist for transportation;Help with stairs or ramp for entrance    Equipment Recommendations None recommended by PT  Recommendations for Other Services       Functional Status Assessment Patient has had a recent decline in their functional status and demonstrates the ability to make significant improvements in function in a reasonable and predictable amount of time.     Precautions / Restrictions Precautions Precautions: Fall;Other (comment) Precaution Comments: 2-3L O2 baseline; negative orthostatic hypotension, though pt c/o lightheaded with standing Restrictions Weight Bearing Restrictions: No      Mobility  Bed Mobility Overal bed mobility: Independent Bed Mobility: Supine to Sit                Transfers Overall transfer level: Independent Equipment used: None                    Ambulation/Gait Ambulation/Gait assistance: Supervision Gait Distance (Feet): 16 Feet Assistive device: None Gait Pattern/deviations: Step-through pattern, Decreased stride length Gait velocity: Dcreased     General Gait Details: slow gait without DME, good awareness of lines/O2; DOE 3-4/4 requiring seated rest; denies dizziness with ambulation  Stairs            Wheelchair Mobility    Modified Rankin (Stroke Patients Only)       Balance Overall balance assessment: Needs assistance Sitting-balance support: Feet supported       Standing balance support: Single extremity supported, No upper extremity supported Standing balance-Leahy Scale: Fair Standing balance comment: ambulatory without DME                             Pertinent Vitals/Pain Pain Assessment Pain Assessment: Faces Faces Pain Scale: Hurts a little bit Pain Location: generalized Pain Descriptors / Indicators: Tiring Pain Intervention(s): Monitored during session  Home Living Family/patient expects to be discharged to:: Private residence Living Arrangements:  Spouse/significant other Available Help at Discharge: Family;Available 24 hours/day Type of Home: Apartment Home Access: Stairs to enter Entrance Stairs-Rails: Left Entrance Stairs-Number of Steps: 14   Home Layout: One level Home Equipment: Rollator (4 wheels);Rolling Walker (2 wheels) Additional Comments: lives in garage apartment. wears 2-3L O2 baseline    Prior Function Prior Level of Function : Independent/Modified Independent             Mobility Comments: limited household and community ambulator due to dizziness and SOB; husband provides HHA as needed ADLs Comments: Uses homemade shower chair for bathing; husband assists with household tasks as needed     Hand Dominance        Extremity/Trunk Assessment   Upper Extremity Assessment Upper Extremity Assessment: Generalized weakness    Lower Extremity Assessment Lower Extremity Assessment: Generalized weakness       Communication   Communication: No difficulties  Cognition Arousal/Alertness: Awake/alert Behavior During Therapy: WFL for tasks assessed/performed Overall Cognitive Status: Within Functional Limits for tasks assessed                                          General Comments General comments (skin integrity, edema, etc.): SpO2 100% on 3L O2 [place] with short bout of standing activity; pt looking for vestibular PT to assess dizziness again ("that treatment I had the last time I was here was the best thing I've ever had... it made a difference") - discussed OP vestibular PT options since acute care vestibular PT unlikely to see her today before d/c. pt demonstrates good awareness of activity pacing and energy conserv strategies; pt has her own flutter valve and incentive spirometer she does daily at home    Exercises Other Exercises Other Exercises: incentive spirometer x5 (pt pulling ~1250 mL)   Assessment/Plan    PT Assessment Patient needs continued PT services (vestibular eval)  PT  Problem List Decreased activity tolerance;Cardiopulmonary status limiting activity       PT Treatment Interventions Functional mobility training;Therapeutic activities;Therapeutic exercise;Balance training;Patient/family education;Stair training    PT Goals (Current goals can be found in the Care Plan section)  Acute Rehab PT Goals Patient Stated Goal: decreased dizziness, improved breathing PT Goal Formulation: With patient Time For Goal Achievement: 10/02/21 Potential to Achieve Goals: Fair    Frequency Min 2X/week     Co-evaluation               AM-PAC PT "6 Clicks" Mobility  Outcome Measure Help needed turning from your back to your side while in a flat bed without using bedrails?: None Help needed moving from lying on your back to sitting on the side of a flat bed without using bedrails?: None Help needed moving to and from a bed to a chair (including a wheelchair)?: None Help needed standing up from a chair using your arms (e.g., wheelchair or bedside chair)?: None Help needed to walk in hospital room?: A Little Help needed climbing 3-5 steps with a railing? : A Little 6 Click Score: 22    End of Session Equipment Utilized During Treatment: Oxygen Activity Tolerance: Patient tolerated treatment well;Patient limited by fatigue Patient left: in chair;with call bell/phone within reach Nurse Communication: Mobility status PT Visit Diagnosis: Other abnormalities of gait and mobility (R26.89);Dizziness and giddiness (R42)    Time: 9373-4287 PT Time  Calculation (min) (ACUTE ONLY): 25 min   Charges:   PT Evaluation $PT Eval Moderate Complexity: 1 Mod        Mabeline Caras, PT, DPT Acute Rehabilitation Services  Pager [phone] Office [place] 09/18/2021, 11:45 AM

## 2021-09-18 NOTE — Progress Notes (Signed)
Patient given discharge instructions and stated understanding.  Waiting to leave for her code 44 to be resolved.

## 2021-09-18 NOTE — Discharge Summary (Addendum)
Physician Discharge Summary  Kim [name] DPO:[id number] DOB: 1955-05-14 DOA: 09/17/2021  PCP: [name], NP  Admit date: 09/17/2021 Discharge date: 09/18/2021  Admitted From: Home Disposition:  Home  Recommendations for Outpatient Follow-up:  Follow up with PCP in 1-2 weeks Follow up with pulmonology as scheduled  Home Health:HHPT  Equipment/Devices:none  Discharge Condition: Stable CODE STATUS: DNR Diet recommendation: As tolerated  Brief/Interim Summary: Kim [name] is a 66 y.o. female with medical history significant of COPD; HLD; and depression presenting with SOB. She was last hospitalized from 5/22-25.  At that time, she had a syncopal event with SDH and was discharged to [facility].     Having worsening dizziness and headaches followed outpatient by cardiology/pulmonolgy. Currently on Zio patch at the time of admission.    Felt SOB --> her O2 sat on BIPAP and it was 90%.  She asked for Xanax and a neb treatment and called 911.  She was placed on BIPAP in the ER and was off BIPAP and on Garden O2 at the time of my evaluation.  She had persistent tachypnea and increased WOB but had moderate air movement.  No significant change in cough.  No fever.  She acknowledges advanced COPD and is aware of her ongoing decline.   Lengthy discussion with both pulmonology and cardiology today patient appears to be back to baseline - she improved much more quickly than expected.  We will continue short-term steroid taper per discussion.  Cardiology work-up unremarkable, troponin appears to be chronically elevated, no further cardiac or pulmonology symptoms and stable otherwise for discharge home with outpatient work-up as discussed.    Discharge Diagnoses:  Principal Problem:   COPD with acute exacerbation ([place]) Active Problems:   Elevated troponin   Chronic combined systolic (congestive) and diastolic (congestive) heart failure (HCC)   Subarachnoid hemorrhage (HCC)   Anxiety and  depression   Essential hypertension   DNR (do not resuscitate)   Acute respiratory failure with hypoxia and hypercapnia [facility])    Discharge Instructions  Discharge Instructions     Ambulatory referral to Physical Therapy   Complete by: As directed    Outpatient PT (vestibular) evaluation and treatment   Discharge patient   Complete by: As directed    Discharge disposition: 01-Home or Self Care   Discharge patient date: 09/18/2021      Allergies as of 09/18/2021       Reactions   Tape Other (See Comments)   Skin tears easily, peels skin off   Daliresp [roflumilast]    Skin peeling   Latex Other (See Comments)   Peels skin off   Lisinopril Cough   Morphine And Related    hallucinations   Penicillins Hives, Other (See Comments)   Tolerates Omnicef Has patient had a PCN reaction causing immediate rash, facial/tongue/throat swelling, SOB or lightheadedness with hypotension: Yes Has patient had a PCN reaction causing severe rash involving mucus membranes or skin necrosis: No Has patient had a PCN reaction that required hospitalization No Has patient had a PCN reaction occurring within the last 10 years: No If all of the above answers are "NO", then may proceed with Cephalosporin use.   Omnicef [cefdinir] Rash        Medication List     STOP taking these medications    metoprolol succinate 25 MG 24 hr tablet Commonly known as: TOPROL-XL       TAKE these medications    acetaminophen 500 MG tablet Commonly known as: TYLENOL Take  500 mg by mouth 2 (two) times daily as needed for headache.   albuterol 108 (90 Base) MCG/ACT inhaler Commonly known as: VENTOLIN HFA INHALE TWO PUFFS INTO LUNGS EVERY 4 TO 6 HOURS AS NEEDED FOR SHORTNESS OF BREATH/WHEEZING. What changed:  how much to take how to take this when to take this reasons to take this additional instructions   ALPRAZolam 0.5 MG tablet Commonly known as: XANAX Take 1 tablet (0.5 mg total) by mouth 3  (three) times daily as needed for anxiety.   aspirin EC 81 MG tablet Take 1 tablet (81 mg total) by mouth daily. What changed: when to take this   bisoprolol 5 MG tablet Commonly known as: ZEBETA Take 0.5 tablets (2.5 mg total) by mouth daily. Start taking on: September 19, 2021   butalbital-acetaminophen-caffeine 50-325-40 MG tablet Commonly known as: FIORICET Take 1 tablet by mouth every 6 (six) hours as needed for headache.   CALCIUM 600 + D PO Take 600 mg by mouth in the morning and at bedtime. Calcium 600 mg + 4000 unit D3   cetirizine 10 MG tablet Commonly known as: ZYRTEC Take 10 mg by mouth daily.   furosemide 40 MG tablet Commonly known as: LASIX Take 1 tablet (40 mg total) by mouth daily as needed (for an overnight weight gain of 3-5 pounds of fluid).   Ginkgo Biloba Extract 60 MG Caps Take 1 capsule by mouth daily.   HYDROcodone-acetaminophen 7.5-325 MG tablet Commonly known as: Norco Take 1 tablet by mouth every 6 (six) hours as needed for moderate pain.   ipratropium-albuterol 0.5-2.5 (3) MG/3ML Soln Commonly known as: DUONEB USE 3 ML VIA NEBULIZER EVERY 6 HOURS AS NEEDED.  DX: J44.9 What changed:  how much to take how to take this additional instructions   lidocaine 5 % Commonly known as: LIDODERM Place 1 patch onto the skin daily. Remove & Discard patch within 12 hours or as directed by MD What changed: when to take this   losartan 25 MG tablet Commonly known as: COZAAR Take 1 tablet (25 mg total) by mouth daily.   magnesium oxide 400 MG tablet Commonly known as: MAG-OX Take 400 mg by mouth daily.   Mucinex Maximum Strength 1200 MG Tb12 Generic drug: Guaifenesin Take 1,200 mg by mouth every 12 (twelve) hours.   nitroGLYCERIN 0.4 MG SL tablet Commonly known as: NITROSTAT Place 1 tablet (0.4 mg total) under the tongue every 5 (five) minutes as needed for chest pain.   nystatin 100000 UNIT/ML suspension Commonly known as: MYCOSTATIN Take 5 mLs by  mouth 3 (three) times daily as needed (thrush).   ondansetron 4 MG tablet Commonly known as: Zofran Take 1 tablet (4 mg total) by mouth every 8 (eight) hours as needed for nausea or vomiting.   OXYGEN Inhale 2 L/min into the lungs continuous.   PARoxetine 30 MG tablet Commonly known as: PAXIL Take 1 tablet (30 mg total) by mouth daily.   PATADAY OP Place 1 drop into the left eye daily.   potassium chloride 10 MEQ tablet Commonly known as: KLOR-CON Take 1 tablet (10 mEq total) by mouth as needed (take with furosemide (lasix)).   predniSONE 20 MG tablet Commonly known as: DELTASONE Take 1 tablet (20 mg total) by mouth daily with breakfast for 2 days. Start taking on: September 19, 2021 What changed: You were already taking a medication with the same name, and this prescription was added. Make sure you understand how and when to take each.  predniSONE 10 MG tablet Commonly known as: DELTASONE Take 1 tablet (10 mg total) by mouth daily. Start taking on: September 21, 2021 What changed: These instructions start on September 21, 2021. If you are unsure what to do until then, ask your doctor or other care provider.   Spacer/Aero Chamber Mouthpiece Misc 1 Device by Does not apply route as directed.   Spiriva Respimat 2.5 MCG/ACT Aers Generic drug: Tiotropium Bromide Monohydrate Inhale 2 puffs into the lungs daily. What changed:  when to take this reasons to take this   Symbicort 160-4.5 MCG/ACT inhaler Generic drug: budesonide-formoterol INHALE 2 PUFFS TWICE DAILY What changed: how to take this   VITAMIN D3 PO Take 5,000 Units by mouth daily.        Follow-up Information     Swinyer, Lanice Schwab, NP Follow up on 10/10/2021.   Specialty: Nurse Practitioner Why: '@3'$ :10pm for hospital follow up Contact information: [place] [address] [phone]         [facility].   Specialty: Rehabilitation Why:  Office to call with an appointment time. If the office has not called within 3-5 business days please call the office. Contact information: [address] [place] [id number] [place] [address] [phone]               Allergies  Allergen Reactions   Tape Other (See Comments)    Skin tears easily, peels skin off   Daliresp [Roflumilast]     Skin peeling   Latex Other (See Comments)    Peels skin off   Lisinopril Cough   Morphine And Related     hallucinations   Penicillins Hives and Other (See Comments)    Tolerates Omnicef Has patient had a PCN reaction causing immediate rash, facial/tongue/throat swelling, SOB or lightheadedness with hypotension: Yes Has patient had a PCN reaction causing severe rash involving mucus membranes or skin necrosis: No Has patient had a PCN reaction that required hospitalization No Has patient had a PCN reaction occurring within the last 10 years: No If all of the above answers are "NO", then may proceed with Cephalosporin use.    Omnicef [Cefdinir] Rash    Consultations: Pulmonology, cardiology  Procedures/Studies: CT HEAD WO CONTRAST (5MM)  Result Date: 09/17/2021 CLINICAL DATA:  Dizziness EXAM: CT HEAD WITHOUT CONTRAST TECHNIQUE: Contiguous axial images were obtained from the base of the skull through the vertex without intravenous contrast. RADIATION DOSE REDUCTION: This exam was performed according to the departmental dose-optimization program which includes automated exposure control, adjustment of the mA and/or kV according to patient size and/or use of iterative reconstruction technique. COMPARISON:  08/26/2021 FINDINGS: Brain: No acute intracranial findings are seen in noncontrast CT brain. There is interval resolution of small foci of subarachnoid hemorrhage adjacent to the falx. There are no signs of recent bleeding within the cranium. Cortical sulci are prominent. Vascular: Unremarkable. Skull: Unremarkable.  Sinuses/Orbits: Unremarkable. Other: None. IMPRESSION: No acute intracranial findings are seen in noncontrast CT brain. There is interval resolution of small foci of subarachnoid hemorrhage adjacent to the falx since 08/26/2021. atrophy. Electronically Signed   By: Elmer Picker M.D.   On: 09/17/2021 13:20   DG Chest Port 1 View  Result Date: 09/17/2021 CLINICAL DATA:  66 year old female with shortness of breath, respiratory distress. EXAM: PORTABLE CHEST 1 VIEW COMPARISON:  Portable chest 08/26/2021 and earlier. Chest CT 08/28/2021 and earlier. FINDINGS: Centrilobular emphysema better demonstrated by CT last month.  Portable AP semi upright view at 0451 hours. Stable lung volumes and mediastinal contours. No pneumothorax, pulmonary edema, pleural effusion, or confluent pulmonary opacity. 5 cm generator type device newly projects over the left upper chest, but no connected leads are identified. EKG leads elsewhere. Visualized tracheal air column is within normal limits. No acute osseous abnormality identified. IMPRESSION: Emphysema.  No acute cardiopulmonary abnormality. Electronically Signed   By: Genevie Ann M.D.   On: 09/17/2021 05:39   CT CHEST WO CONTRAST  Result Date: 08/29/2021 CLINICAL DATA:  Abnormal xray - lung nodule, >= 1 cm Right upper lobe opacity on CXR EXAM: CT CHEST WITHOUT CONTRAST TECHNIQUE: Multidetector CT imaging of the chest was performed following the standard protocol without IV contrast. RADIATION DOSE REDUCTION: This exam was performed according to the departmental dose-optimization program which includes automated exposure control, adjustment of the mA and/or kV according to patient size and/or use of iterative reconstruction technique. COMPARISON:  05/08/2021, 01/31/2020 FINDINGS: Cardiovascular: Moderate coronary artery calcification. Global cardiac size within normal limits. No pericardial effusion. Central pulmonary arteries are of normal caliber. Mild atherosclerotic  calcification within the thoracic aorta. No aortic aneurysm. Mediastinum/Nodes: No enlarged mediastinal or axillary lymph nodes. Thyroid gland, trachea, and esophagus demonstrate no significant findings. Lungs/Pleura: Severe emphysema. No suspicious or indeterminate focal pulmonary nodules. Chronic volume loss within the right middle lobe. No confluent pulmonary infiltrates. No pneumothorax or pleural effusion. No central obstructing lesion. Upper Abdomen: Status post cholecystectomy.  No acute abnormality. Musculoskeletal: No acute bone abnormality. No lytic or blastic bone lesion. IMPRESSION: No suspicious or indeterminate focal pulmonary nodules. No acute intrathoracic pathology identified. Severe emphysema. Moderate coronary artery calcification. Aortic Atherosclerosis (ICD10-I70.0) and Emphysema (ICD10-J43.9). Electronically Signed   By: Fidela Salisbury M.D.   On: 08/29/2021 00:23   ECHOCARDIOGRAM COMPLETE  Result Date: 08/26/2021    ECHOCARDIOGRAM REPORT   Patient Name:   Kim [name] Date of Exam: 08/26/2021 Medical Rec #:  [id number]       Height:       61.0 in Accession #:    [id number]      Weight:       144.0 lb Date of Birth:  May 13, 1955      BSA:          1.643 m Patient Age:    66 years        BP:           105/69 mmHg Patient Gender: F               HR:           91 bpm. Exam Location:  Inpatient Procedure: 2D Echo, Cardiac Doppler, Color Doppler and Intracardiac            Opacification Agent Indications:    Elevated troponin  History:        Patient has prior history of Echocardiogram examinations, most                 recent 12/19/2020. COPD.  Sonographer:    Joette Catching RCS Referring Phys: [phone] RONDELL A SMITH  Sonographer Comments: Technically difficult study due to poor echo windows. Image acquisition challenging due to COPD. IMPRESSIONS  1. Left ventricular ejection fraction, by estimation, is 35 to 40%. The left ventricle has moderately decreased function. The left ventricle  demonstrates regional wall motion abnormalities (see scoring diagram/findings for description). There is hypokinesis of all mid LV segments although appears slightly worse in the mid septal LV  segments. The apical LV segments appear hyperkinetic. 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 Clinical correlation recommended.  2. Left ventricular diastolic parameters are consistent with Grade I diastolic dysfunction (impaired relaxation).  3. Right ventricular systolic function is normal. The right ventricular size is normal.  4. The mitral valve is normal in structure. Trivial mitral valve regurgitation.  5. The aortic valve was not well visualized. Aortic valve regurgitation is mild. Aortic valve sclerosis/calcification is present, without any evidence of aortic stenosis.  6. The inferior vena cava is normal in size with greater than 50% respiratory variability, suggesting right atrial pressure of 3 mmHg. Comparison(s): Compared to prior TTE in 11/2020, there is now hyperkinesis of the LV apical segments (previously akinetic) and hypokinesis of the mid LV segments. EF now 35-40% (previously 30-35%). 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 FINDINGS  Left Ventricle: There is hypokinesis of all mid LV segments although appears slightly worse in the mid septal LV segments. The apical LV segments appear hyperkinetic. 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 Clinical correlation recommended. Left ventricular ejection fraction, by estimation, is 35 to 40%. The left ventricle has moderately decreased function. The left ventricle demonstrates regional wall motion abnormalities. Definity  contrast agent was given IV to delineate the left ventricular endocardial borders. The left ventricular internal cavity size was normal in size. There is no left ventricular  hypertrophy. Left ventricular diastolic parameters are consistent with Grade I diastolic dysfunction (impaired relaxation). Right Ventricle: The right ventricular size is normal. No increase in right ventricular wall thickness. Right ventricular systolic function is normal. Left Atrium: Left atrial size was normal in size. Right Atrium: Right atrial size was normal in size. Pericardium: There is no evidence of pericardial effusion. Mitral Valve: The mitral valve is normal in structure. Trivial mitral valve regurgitation. Tricuspid Valve: The tricuspid valve is normal in structure. Tricuspid valve regurgitation is not demonstrated. Aortic Valve: The aortic valve was not well visualized. Aortic valve regurgitation is mild. Aortic regurgitation PHT measures 275 msec. Aortic valve sclerosis/calcification is present, without any evidence of aortic stenosis. Aortic valve mean gradient measures 4.5 mmHg. Aortic valve peak gradient measures 8.7 mmHg. Aortic valve area, by VTI measures 1.99 cm. Pulmonic Valve: The pulmonic valve was not well visualized. Aorta: The aortic root and ascending aorta are structurally normal, with no evidence of dilitation. Venous: The inferior vena cava is normal in size with greater than 50% respiratory variability, suggesting right atrial pressure of 3 mmHg. IAS/Shunts: The atrial septum is grossly normal.  LEFT VENTRICLE PLAX 2D LVIDd:         4.80 cm      Diastology LVIDs:         3.70 cm      LV e' medial:    6.31 cm/s LV PW:         1.00 cm      LV E/e' medial:  11.4 LV IVS:        0.90 cm      LV e' lateral:   5.33 cm/s LVOT diam:     1.90 cm      LV E/e' lateral: 13.5 LV SV:         52 LV SV Index:   32 LVOT Area:     2.84 cm  LV Volumes (MOD) LV vol d, MOD A2C: 120.0 ml LV vol d, MOD A4C:  114.0 ml LV vol s, MOD A2C: 83.0 ml LV vol s, MOD A4C: 66.6 ml LV SV MOD A2C:     37.0 ml LV SV MOD A4C:     114.0 ml LV SV MOD BP:      46.3 ml RIGHT VENTRICLE RV S prime:     12.80 cm/s TAPSE  (M-mode): 2.2 cm LEFT ATRIUM             Index LA diam:        2.70 cm 1.64 cm/m LA Vol (A2C):   22.3 ml 13.58 ml/m LA Vol (A4C):   12.0 ml 7.31 ml/m LA Biplane Vol: 17.8 ml 10.84 ml/m  AORTIC VALVE                     PULMONIC VALVE AV Area (Vmax):    2.06 cm      PV Vmax:       1.26 m/s AV Area (Vmean):   2.06 cm      PV Peak grad:  6.4 mmHg AV Area (VTI):     1.99 cm AV Vmax:           147.50 cm/s AV Vmean:          103.800 cm/s AV VTI:            0.263 m AV Peak Grad:      8.7 mmHg AV Mean Grad:      4.5 mmHg LVOT Vmax:         107.00 cm/s LVOT Vmean:        75.400 cm/s LVOT VTI:          0.185 m LVOT/AV VTI ratio: 0.70 AI PHT:            275 msec  AORTA Ao Root diam: 2.80 cm Ao Asc diam:  2.60 cm MITRAL VALVE                TRICUSPID VALVE MV Area (PHT): 8.92 cm     TR Peak grad:   26.0 mmHg MV Decel Time: 85 msec      TR Vmax:        255.00 cm/s MV E velocity: 72.20 cm/s MV A velocity: 103.00 cm/s  SHUNTS MV E/A ratio:  0.70         Systemic VTI:  0.18 m                             Systemic Diam: 1.90 cm Gwyndolyn Kaufman MD Electronically signed by Gwyndolyn Kaufman MD Signature Date/Time: 08/26/2021/4:08:17 PM    Final    MR BRAIN WO CONTRAST  Result Date: 08/26/2021 CLINICAL DATA:  Diplopia EXAM: MRI HEAD WITHOUT CONTRAST TECHNIQUE: Multiplanar, multiecho pulse sequences of the brain and surrounding structures were obtained without intravenous contrast. COMPARISON:  CT 08/26/2021, MRI 05/12/2021 FINDINGS: Brain: There is abnormal diffusion hyperintensity with corresponding susceptibility the parasagittal sulci along the falx reflecting subarachnoid hemorrhage seen on the prior CT. No acute infarction. Ventricles and sulci are stable in size and configuration. Patchy foci of T2 hyperintensity in the supratentorial white matter are nonspecific but may reflect stable chronic microvascular ischemic changes. No mass or mass effect. There is no extra-axial collection. Vascular: Major vessel flow voids  at the skull base are preserved. Skull and upper cervical spine: Normal marrow signal is preserved. Sinuses/Orbits: Paranasal sinuses are aerated. Bilateral lens replacements. Other: Sella is unremarkable.  Mastoid  air cells are clear. IMPRESSION: Abnormal signal along the falx corresponding to subarachnoid hemorrhage on CT. Otherwise no significant change since prior study. Electronically Signed   By: Macy Mis M.D.   On: 08/26/2021 15:24   DG Chest Port 1 View  Result Date: 08/26/2021 CLINICAL DATA:  Shortness of breath and chest pain. EXAM: PORTABLE CHEST 1 VIEW COMPARISON:  Portable chest 06/29/2021. FINDINGS: Heart size and vascular pattern are normal. There is aortic atherosclerosis in the transverse segment, unremarkable mediastinal configuration. No vascular congestion is seen. The lungs emphysematous. There is an irregular 2 x 1.1 cm opacity developed in the right upper lobe mid perihilar area. The remaining lungs clear. No pleural collection is seen. There is thoracic spondylosis. IMPRESSION: 2 x 1.1 cm irregular opacity developed, right upper lobe mid perihilar area. The last chest CT was 05/08/2021 and demonstrated perifissural scar-like thickening in the right upper lung field extending along the major fissure. I suspect this is probably being seen in summation given the apical lordotic positioning on this study. A small infiltrate or less likely mass are both possible in a high-risk patient. Chest CT could rule out mass if needed or a short interval follow-up study could be considered to see if this persists. Electronically Signed   By: Telford Nab M.D.   On: 08/26/2021 06:10   DG Lumbar Spine Complete  Result Date: 08/26/2021 CLINICAL DATA:  Pain. EXAM: LUMBAR SPINE - COMPLETE 4+ VIEW COMPARISON:  None Available. FINDINGS: Bones are demineralized. No evidence for lumbar spine fracture or subluxation. Mild loss of disc height noted at L3-4, L4-5 common L5-S1. Mild bilateral facet  degeneration noted in the lower lumbar spine. Atherosclerotic calcification characterizes the abdominal aorta. IMPRESSION: Degenerative changes in the lower lumbar spine without acute bony abnormality. Aortic Atherosclerois (ICD10-170.0) Electronically Signed   By: Misty Stanley M.D.   On: 08/26/2021 06:01   DG Thoracic Spine 2 View  Result Date: 08/26/2021 CLINICAL DATA:  Pain. EXAM: THORACIC SPINE 2 VIEWS COMPARISON:  None Available. FINDINGS: Two-view exam is limited by osteopenia. Loss of vertebral body height noted at 2 levels in the midthoracic spine likely T6 and T7, proximally 25% loss of height anteriorly at both levels. Features are age indeterminate by x-ray. No abnormal paraspinal line on the frontal projection. Possible sclerosis involving the right pedicle of the T7 vertebral body on the frontal projection. IMPRESSION: Loss of vertebral body height at 2 levels in the midthoracic spine, likely T6 and T7, age indeterminate by x-ray. Possible sclerosis of the right T7 pedicle. MRI thoracic spine could be used to further evaluate as clinically warranted. Electronically Signed   By: Misty Stanley M.D.   On: 08/26/2021 05:59   CT Head Wo Contrast  Addendum Date: 08/26/2021   ADDENDUM REPORT: 08/26/2021 05:21 ADDENDUM: Study discussed by telephone with Dr. Ripley Fraise on 08/26/2021 at 0518 hours. Electronically Signed   By: Genevie Ann M.D.   On: 08/26/2021 05:21   Result Date: 08/26/2021 CLINICAL DATA:  66 year old female with dizziness, hypertensive (systolic 132G). Fall 3 days ago. Diaphoretic. EXAM: CT HEAD WITHOUT CONTRAST TECHNIQUE: Contiguous axial images were obtained from the base of the skull through the vertex without intravenous contrast. RADIATION DOSE REDUCTION: This exam was performed according to the departmental dose-optimization program which includes automated exposure control, adjustment of the mA and/or kV according to patient size and/or use of iterative reconstruction  technique. COMPARISON:  Brain MRI 05/12/2021.  Paranasal sinus CT 10/28/2016. FINDINGS: Brain: Small volume hyperdense blood  along the interhemispheric fissure, left greater than right (series 3, images 24 and 27), most likely in the subarachnoid space. No dural thickening or nodularity of the falx on February MRI. No IVH or ventriculomegaly. Basilar cisterns appear to remain normal. Fetal type PCA origins again noted. No blood along the tentorium or elsewhere. No superimposed midline shift, mass effect, evidence of mass lesion, or evidence of cortically based acute infarction. Gray-white matter differentiation is within normal limits throughout the brain. Vascular: Calcified atherosclerosis at the skull base. No suspicious intracranial vascular hyperdensity. Skull: No fracture identified. Sinuses/Orbits: Visualized paranasal sinuses and mastoids are clear. Other: No acute orbit or scalp soft tissue injury identified. IMPRESSION: 1. Positive for trace Subarachnoid Hemorrhage along the interhemispheric fissure. Favor sequelae of trauma given pattern of blood and recent fall. 2. No skull fracture or other acute traumatic injury to the brain identified. 3. Otherwise negative for age non contrast CT appearance of the brain. Electronically Signed: By: Genevie Ann M.D. On: 08/26/2021 05:15   CT Cervical Spine Wo Contrast  Result Date: 08/26/2021 CLINICAL DATA:  66 year old female with dizziness, hypertensive (systolic 425Z). Fall 3 days ago. Diaphoretic. EXAM: CT CERVICAL SPINE WITHOUT CONTRAST TECHNIQUE: Multidetector CT imaging of the cervical spine was performed without intravenous contrast. Multiplanar CT image reconstructions were also generated. RADIATION DOSE REDUCTION: This exam was performed according to the departmental dose-optimization program which includes automated exposure control, adjustment of the mA and/or kV according to patient size and/or use of iterative reconstruction technique. COMPARISON:  Head  CT today reported separately. FINDINGS: Alignment: Straightening and mild reversal of cervical lordosis. Cervicothoracic junction alignment is within normal limits. Bilateral posterior element alignment is within normal limits. Skull base and vertebrae: Visualized skull base is intact. No atlanto-occipital dissociation. C1 and C2 appear intact and aligned. Intermittent mild motion artifact. No acute osseous abnormality identified. Soft tissues and spinal canal: No prevertebral fluid or swelling. No visible canal hematoma. Partially retropharyngeal course of both carotids in the neck (normal variant). Otherwise negative visible noncontrast neck soft tissues. Disc levels: Chronic cervical spine disc and endplate degeneration. No convincing cervical spinal stenosis. Upper chest: Osteopenia. Grossly intact visible upper thoracic levels. Emphysema. Retained secretions in the trachea at the thoracic inlet. IMPRESSION: 1. No acute traumatic injury identified in the cervical spine. 2. Emphysema (ICD10-J43.9). Retained secretions in the trachea at the thoracic inlet, Aspiration not excluded. Electronically Signed   By: Genevie Ann M.D.   On: 08/26/2021 05:19     Subjective: No acute issues or events overnight denies nausea vomiting diarrhea constipation headache fevers chills or chest pain   Discharge Exam: Vitals:   09/18/21 1055 09/18/21 1155  BP: (!) 145/75 138/69  Pulse: (!) 108 (!) 103  Resp: 19 20  Temp: 98.2 F (36.8 C) 98.1 F (36.7 C)  SpO2: 95%    Vitals:   09/18/21 0732 09/18/21 0800 09/18/21 1055 09/18/21 1155  BP:   (!) 145/75 138/69  Pulse: 96  (!) 108 (!) 103  Resp:   19 20  Temp: 97.9 F (36.6 C) 97.9 F (36.6 C) 98.2 F (36.8 C) 98.1 F (36.7 C)  TempSrc: Oral Oral Oral   SpO2: 97%  95%   Weight:      Height:        General: Pt is alert, awake, not in acute distress Cardiovascular: RRR, S1/S2 +, no rubs, no gallops Respiratory: CTA bilaterally, no wheezing, no  rhonchi Abdominal: Soft, NT, ND, bowel sounds + Extremities: no edema, no  cyanosis    The results of significant diagnostics from this hospitalization (including imaging, microbiology, ancillary and laboratory) are listed below for reference.     Microbiology: Recent Results (from the past 240 hour(s))  Resp Panel by RT-PCR (Flu A&B, Covid) Anterior Nasal Swab     Status: None   Collection Time: 09/17/21  4:45 AM   Specimen: Anterior Nasal Swab  Result Value Ref Range Status   SARS Coronavirus 2 by RT PCR NEGATIVE NEGATIVE Final    Comment: (NOTE) SARS-CoV-2 target nucleic acids are NOT DETECTED.  The SARS-CoV-2 RNA is generally detectable in upper respiratory specimens during the acute phase of infection. The lowest concentration of SARS-CoV-2 viral copies this assay can detect is 138 copies/mL. A negative result does not preclude SARS-Cov-2 infection and should not be used as the sole basis for treatment or other patient management decisions. A negative result may occur with  improper specimen collection/handling, submission of specimen other than nasopharyngeal swab, presence of viral mutation(s) within the areas targeted by this assay, and inadequate number of viral copies(<138 copies/mL). A negative result must be combined with clinical observations, patient history, and epidemiological information. The expected result is Negative.  Fact Sheet for Patients:  EntrepreneurPulse.com.au  Fact Sheet for Healthcare Providers:  IncredibleEmployment.be  This test is no t yet approved or cleared by the [place] FDA and  has been authorized for detection and/or diagnosis of SARS-CoV-2 by FDA under an Emergency Use Authorization (EUA). This EUA will remain  in effect (meaning this test can be used) for the duration of the COVID-19 declaration under Section 564(b)(1) of the Act, 21 U.S.C.section 360bbb-3(b)(1), unless the authorization is  terminated  or revoked sooner.       Influenza A by PCR NEGATIVE NEGATIVE Final   Influenza B by PCR NEGATIVE NEGATIVE Final    Comment: (NOTE) The Xpert Xpress SARS-CoV-2/FLU/RSV plus assay is intended as an aid in the diagnosis of influenza from Nasopharyngeal swab specimens and should not be used as a sole basis for treatment. Nasal washings and aspirates are unacceptable for Xpert Xpress SARS-CoV-2/FLU/RSV testing.  Fact Sheet for Patients: EntrepreneurPulse.com.au  Fact Sheet for Healthcare Providers: IncredibleEmployment.be  This test is not yet approved or cleared by the [place] FDA and has been authorized for detection and/or diagnosis of SARS-CoV-2 by FDA under an Emergency Use Authorization (EUA). This EUA will remain in effect (meaning this test can be used) for the duration of the COVID-19 declaration under Section 564(b)(1) of the Act, 21 U.S.C. section 360bbb-3(b)(1), unless the authorization is terminated or revoked.  Performed at [facility], [place] [address], [place], [place] [address]      Labs: BNP (last 3 results) Recent Labs    06/26/21 1100 08/26/21 0415 09/17/21 0444  BNP 151.5* 96.4 706.2*   Basic Metabolic Panel: Recent Labs  Lab 09/17/21 0444 09/17/21 0457 09/17/21 0500 09/17/21 0637 09/18/21 0109  NA 140 135 138 141 140  K 4.7 4.6 4.5 3.9 4.4  CL 106  --  105  --  106  CO2 23  --   --   --  24  GLUCOSE 325*  --  324*  --  142*  BUN 19  --  23  --  19  CREATININE 1.10*  --  1.10*  --  0.78  CALCIUM 8.9  --   --   --  8.6*   Liver Function Tests: Recent Labs  Lab 09/17/21 0444  AST 19  ALT 16  ALKPHOS 90  BILITOT 0.4  PROT 5.6*  ALBUMIN 3.4*   No results for input(s): "LIPASE", "AMYLASE" in the last 168 hours. No results for input(s): "AMMONIA" in the last 168 hours. CBC: Recent Labs  Lab 09/17/21 0444 09/17/21 0457 09/17/21 0500 09/17/21 0637 09/18/21 0109  WBC 21.3*   --   --   --  15.0*  NEUTROABS 10.9*  --   --   --   --   HGB 11.9* 11.6* 11.9* 11.6* 10.6*  HCT 38.6 34.0* 35.0* 34.0* 33.1*  MCV 102.9*  --   --   --  97.9  PLT 310  --   --   --  252   Cardiac Enzymes: No results for input(s): "CKTOTAL", "CKMB", "CKMBINDEX", "TROPONINI" in the last 168 hours. BNP: Invalid input(s): "POCBNP" CBG: No results for input(s): "GLUCAP" in the last 168 hours. D-Dimer No results for input(s): "DDIMER" in the last 72 hours. Hgb A1c No results for input(s): "HGBA1C" in the last 72 hours. Lipid Profile No results for input(s): "CHOL", "HDL", "LDLCALC", "TRIG", "CHOLHDL", "LDLDIRECT" in the last 72 hours. Thyroid function studies No results for input(s): "TSH", "T4TOTAL", "T3FREE", "THYROIDAB" in the last 72 hours.  Invalid input(s): "FREET3" Anemia work up No results for input(s): "VITAMINB12", "FOLATE", "FERRITIN", "TIBC", "IRON", "RETICCTPCT" in the last 72 hours. Urinalysis    Component Value Date/Time   COLORURINE YELLOW 08/26/2021 0805   APPEARANCEUR CLEAR 08/26/2021 0805   LABSPEC 1.021 08/26/2021 0805   PHURINE 5.0 08/26/2021 0805   GLUCOSEU NEGATIVE 08/26/2021 0805   HGBUR NEGATIVE 08/26/2021 0805   HGBUR trace-lysed 02/26/2010 0951   BILIRUBINUR NEGATIVE 08/26/2021 0805   BILIRUBINUR negative 04/29/2021 1243   KETONESUR NEGATIVE 08/26/2021 0805   PROTEINUR NEGATIVE 08/26/2021 0805   UROBILINOGEN 0.2 04/29/2021 1243   UROBILINOGEN 0.2 02/26/2010 0951   NITRITE NEGATIVE 08/26/2021 0805   LEUKOCYTESUR NEGATIVE 08/26/2021 0805   Sepsis Labs Recent Labs  Lab 09/17/21 0444 09/18/21 0109  WBC 21.3* 15.0*   Microbiology Recent Results (from the past 240 hour(s))  Resp Panel by RT-PCR (Flu A&B, Covid) Anterior Nasal Swab     Status: None   Collection Time: 09/17/21  4:45 AM   Specimen: Anterior Nasal Swab  Result Value Ref Range Status   SARS Coronavirus 2 by RT PCR NEGATIVE NEGATIVE Final    Comment: (NOTE) SARS-CoV-2 target  nucleic acids are NOT DETECTED.  The SARS-CoV-2 RNA is generally detectable in upper respiratory specimens during the acute phase of infection. The lowest concentration of SARS-CoV-2 viral copies this assay can detect is 138 copies/mL. A negative result does not preclude SARS-Cov-2 infection and should not be used as the sole basis for treatment or other patient management decisions. A negative result may occur with  improper specimen collection/handling, submission of specimen other than nasopharyngeal swab, presence of viral mutation(s) within the areas targeted by this assay, and inadequate number of viral copies(<138 copies/mL). A negative result must be combined with clinical observations, patient history, and epidemiological information. The expected result is Negative.  Fact Sheet for Patients:  EntrepreneurPulse.com.au  Fact Sheet for Healthcare Providers:  IncredibleEmployment.be  This test is no t yet approved or cleared by the [place] FDA and  has been authorized for detection and/or diagnosis of SARS-CoV-2 by FDA under an Emergency Use Authorization (EUA). This EUA will remain  in effect (meaning this test can be used) for the duration of the COVID-19 declaration under Section 564(b)(1) of the Act, 21  U.S.C.section 360bbb-3(b)(1), unless the authorization is terminated  or revoked sooner.       Influenza A by PCR NEGATIVE NEGATIVE Final   Influenza B by PCR NEGATIVE NEGATIVE Final    Comment: (NOTE) The Xpert Xpress SARS-CoV-2/FLU/RSV plus assay is intended as an aid in the diagnosis of influenza from Nasopharyngeal swab specimens and should not be used as a sole basis for treatment. Nasal washings and aspirates are unacceptable for Xpert Xpress SARS-CoV-2/FLU/RSV testing.  Fact Sheet for Patients: EntrepreneurPulse.com.au  Fact Sheet for Healthcare  Providers: IncredibleEmployment.be  This test is not yet approved or cleared by the [place] FDA and has been authorized for detection and/or diagnosis of SARS-CoV-2 by FDA under an Emergency Use Authorization (EUA). This EUA will remain in effect (meaning this test can be used) for the duration of the COVID-19 declaration under Section 564(b)(1) of the Act, 21 U.S.C. section 360bbb-3(b)(1), unless the authorization is terminated or revoked.  Performed at [facility], [place] [address], [place], [place] [address]      Time coordinating discharge: Over 30 minutes  SIGNED:   Little Ishikawa, DO [facility] 09/18/2021, 2:28 PM Pager   If 7PM-7AM, please contact night-coverage [email or web address]

## 2021-09-18 NOTE — Progress Notes (Signed)
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  Minimal subarachnoid hemorrhage for which cardiac monitor was placed Situation compounded by panic episodes/severe anxiety for which she is on chronic benzodiazepine   Reviewed prior cardiology consultation, prior office notes She has severe osteoporosis and got her first dose of Reclast on 6/12.  About 12 hours after this at 3 AM she developed a sudden episode of respiratory distress while sleeping on NIV plus oxygen , was brought in by EMS after being administered albuterol, Atrovent Solu-Medrol and magnesium.  Blood pressure was extremely high initially but improved during ED stay.  Placed on BiPAP with improvement Initial ABG was 7.2 7/51/132, improved to 7.3/44.  Pertinent  Medical History   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   OSTEOARTHRITIS 06/05/2009   PARESTHESIA 08/24/2007   TOBACCO ABUSE 01/25/2010     Significant Hospital Events: Including procedures, antibiotic start and stop dates in addition to other pertinent events   09/17/2021 shortness of breath  Interim History / Subjective:   Much improved. No events overnight. Troponins increased and then flat  Objective   Blood pressure (!) 112/93, pulse 96, temperature 97.9 F (36.6 C), temperature source Oral, resp. rate (!) 22, height '5\' 1"'$   (1.549 m), weight 65.7 kg, SpO2 97 %.        Intake/Output Summary (Last 24 hours) at 09/18/2021 1044 Last data filed at 09/18/2021 0919 Gross per 24 hour  Intake 1240 ml  Output 1150 ml  Net 90 ml    Filed Weights   09/17/21 0449 09/17/21 1621  Weight: 64.8 kg 65.7 kg    Examination: General: Well-nourished well-developed woman, sitting up in bed, no distress HENT: Steroid facies, no JVD or lymphadenopathy Lungs: Decreased breath sounds bilateral, no rhonchi, no accessory muscle use Cardiovascular: S1-S2 regular rate and rhythm Abdomen: Soft nontender positive bowel sounds Extremities: Without edema Neuro: Grossly intact without focal defect  Labs show normal electrolytes , decreased leukocytosis, stable anemia   Resolved Hospital Problem list     Assessment & Plan:   acute on chronic respiratory failure with hypoxia and hypercarbia COPD exacerbation Review of literature shows case reports of bronchospasm with Reclast  Much improved, can rapidly taper from 40 of prednisone to 20 mg for 2 days and then back to her baseline dose of 10 mg -Continue nocturnal NIV -Continue triple therapy -Due to her sudden flares with seems to be more like asthma, will consider starting biologic as outpatient-choices include Fasenra or Dupixent AEC 300 Discussed with patient and husband at bedside  Chronic systolic heart failure, history of Takotsubo Elevated troponins?  Demand ischemia -Per cardiology and primary service   PCCM will be available as needed  Best Practice (right click and "Reselect all SmartList Selections" daily)   Diet/type: Regular consistency (see orders) DVT prophylaxis: LMWH GI prophylaxis: PPI Lines: N/A Foley:  N/A Code Status:  DNR Last date of multidisciplinary goals of care  discussion  DNR confirmed Labs   CBC: Recent Labs  Lab 09/17/21 0444 09/17/21 0457 09/17/21 0500 09/17/21 0637 09/18/21 0109  WBC 21.3*  --   --   --  15.0*  NEUTROABS  10.9*  --   --   --   --   HGB 11.9* 11.6* 11.9* 11.6* 10.6*  HCT 38.6 34.0* 35.0* 34.0* 33.1*  MCV 102.9*  --   --   --  97.9  PLT 310  --   --   --  252     Basic Metabolic Panel: Recent Labs  Lab 09/17/21 0444 09/17/21 0457 09/17/21 0500 09/17/21 0637 09/18/21 0109  NA 140 135 138 141 140  K 4.7 4.6 4.5 3.9 4.4  CL 106  --  105  --  106  CO2 23  --   --   --  24  GLUCOSE 325*  --  324*  --  142*  BUN 19  --  23  --  19  CREATININE 1.10*  --  1.10*  --  0.78  CALCIUM 8.9  --   --   --  8.6*    GFR: Estimated Creatinine Clearance: 59.2 mL/min (by C-G formula based on SCr of 0.78 mg/dL). Recent Labs  Lab 09/17/21 0444 09/18/21 0109  WBC 21.3* 15.0*     Liver Function Tests: Recent Labs  Lab 09/17/21 0444  AST 19  ALT 16  ALKPHOS 90  BILITOT 0.4  PROT 5.6*  ALBUMIN 3.4*    No results for input(s): "LIPASE", "AMYLASE" in the last 168 hours. No results for input(s): "AMMONIA" in the last 168 hours.  ABG    Component Value Date/Time   PHART 7.345 (L) 09/17/2021 0637   PCO2ART 44.4 09/17/2021 0637   PO2ART 133 (H) 09/17/2021 0637   HCO3 24.5 09/17/2021 0637   TCO2 26 09/17/2021 0637   ACIDBASEDEF 2.0 09/17/2021 0637   O2SAT 99 09/17/2021 0637     Coagulation Profile: No results for input(s): "INR", "PROTIME" in the last 168 hours.  Cardiac Enzymes: No results for input(s): "CKTOTAL", "CKMB", "CKMBINDEX", "TROPONINI" in the last 168 hours.  HbA1C: Hgb A1c MFr Bld  Date/Time Value Ref Range Status  03/22/2018 07:57 AM 6.0 4.6 - 6.5 % Final    Comment:    Glycemic Control Guidelines for People with Diabetes:Non Diabetic:  <6%Goal of Therapy: <7%Additional Action Suggested:  >8%     CBG: No results for input(s): "GLUCAP" in the last 168 hours.   Kara Mead MD. Shade Flood. [facility] Pager : [phone]  If no response to pager , please call [phone] until 7 pm After 7:00 pm call Elink  [phone]     09/18/2021, 10:44  AM

## 2021-09-18 NOTE — Care Management (Signed)
1426 09-18-21 PT recommendations for Outpatient PT (vestibular) Ambulatory referral submitted to [facility] for vestibular PT. Information placed on the AVS. No further needs identified at this time.

## 2021-09-19 ENCOUNTER — Inpatient Hospital Stay (HOSPITAL_COMMUNITY): Payer: Medicare Other

## 2021-09-19 ENCOUNTER — Inpatient Hospital Stay (HOSPITAL_COMMUNITY)
Admission: EM | Admit: 2021-09-19 | Discharge: 2021-09-24 | DRG: 291 | Disposition: A | Discharge status: Home / Self Care | Payer: Medicare Other | Attending: Internal Medicine | Admitting: Internal Medicine

## 2021-09-19 ENCOUNTER — Emergency Department (HOSPITAL_COMMUNITY): Payer: Medicare Other

## 2021-09-19 ENCOUNTER — Encounter (HOSPITAL_COMMUNITY): Payer: Self-pay | Admitting: Emergency Medicine

## 2021-09-19 ENCOUNTER — Other Ambulatory Visit: Payer: Self-pay

## 2021-09-19 DIAGNOSIS — J9611 Chronic respiratory failure with hypoxia: Secondary | ICD-10-CM

## 2021-09-19 DIAGNOSIS — T380X5A Adverse effect of glucocorticoids and synthetic analogues, initial encounter: Secondary | ICD-10-CM | POA: Diagnosis present

## 2021-09-19 DIAGNOSIS — R0602 Shortness of breath: Secondary | ICD-10-CM | POA: Diagnosis present

## 2021-09-19 DIAGNOSIS — J9811 Atelectasis: Secondary | ICD-10-CM | POA: Diagnosis present

## 2021-09-19 DIAGNOSIS — I10 Essential (primary) hypertension: Secondary | ICD-10-CM

## 2021-09-19 DIAGNOSIS — F419 Anxiety disorder, unspecified: Secondary | ICD-10-CM | POA: Diagnosis not present

## 2021-09-19 DIAGNOSIS — M5412 Radiculopathy, cervical region: Secondary | ICD-10-CM | POA: Diagnosis present

## 2021-09-19 DIAGNOSIS — I5022 Chronic systolic (congestive) heart failure: Secondary | ICD-10-CM | POA: Diagnosis present

## 2021-09-19 DIAGNOSIS — Z7952 Long term (current) use of systemic steroids: Secondary | ICD-10-CM

## 2021-09-19 DIAGNOSIS — I5023 Acute on chronic systolic (congestive) heart failure: Secondary | ICD-10-CM | POA: Diagnosis present

## 2021-09-19 DIAGNOSIS — J441 Chronic obstructive pulmonary disease with (acute) exacerbation: Secondary | ICD-10-CM

## 2021-09-19 DIAGNOSIS — D72829 Elevated white blood cell count, unspecified: Secondary | ICD-10-CM | POA: Diagnosis present

## 2021-09-19 DIAGNOSIS — R0600 Dyspnea, unspecified: Principal | ICD-10-CM

## 2021-09-19 DIAGNOSIS — R55 Syncope and collapse: Secondary | ICD-10-CM | POA: Diagnosis not present

## 2021-09-19 DIAGNOSIS — I110 Hypertensive heart disease with heart failure: Principal | ICD-10-CM | POA: Diagnosis present

## 2021-09-19 DIAGNOSIS — J9621 Acute and chronic respiratory failure with hypoxia: Secondary | ICD-10-CM

## 2021-09-19 DIAGNOSIS — Z87442 Personal history of urinary calculi: Secondary | ICD-10-CM

## 2021-09-19 DIAGNOSIS — F32A Depression, unspecified: Secondary | ICD-10-CM | POA: Diagnosis present

## 2021-09-19 DIAGNOSIS — Z7951 Long term (current) use of inhaled steroids: Secondary | ICD-10-CM

## 2021-09-19 DIAGNOSIS — I5042 Chronic combined systolic (congestive) and diastolic (congestive) heart failure: Secondary | ICD-10-CM

## 2021-09-19 DIAGNOSIS — E875 Hyperkalemia: Secondary | ICD-10-CM | POA: Diagnosis not present

## 2021-09-19 DIAGNOSIS — Z880 Allergy status to penicillin: Secondary | ICD-10-CM

## 2021-09-19 DIAGNOSIS — J9612 Chronic respiratory failure with hypercapnia: Secondary | ICD-10-CM | POA: Diagnosis not present

## 2021-09-19 DIAGNOSIS — Z8679 Personal history of other diseases of the circulatory system: Secondary | ICD-10-CM

## 2021-09-19 DIAGNOSIS — Z66 Do not resuscitate: Secondary | ICD-10-CM | POA: Diagnosis present

## 2021-09-19 DIAGNOSIS — F411 Generalized anxiety disorder: Secondary | ICD-10-CM | POA: Diagnosis present

## 2021-09-19 DIAGNOSIS — E785 Hyperlipidemia, unspecified: Secondary | ICD-10-CM | POA: Diagnosis present

## 2021-09-19 DIAGNOSIS — I5043 Acute on chronic combined systolic (congestive) and diastolic (congestive) heart failure: Secondary | ICD-10-CM | POA: Diagnosis present

## 2021-09-19 DIAGNOSIS — M1990 Unspecified osteoarthritis, unspecified site: Secondary | ICD-10-CM | POA: Diagnosis present

## 2021-09-19 DIAGNOSIS — I959 Hypotension, unspecified: Secondary | ICD-10-CM | POA: Diagnosis not present

## 2021-09-19 DIAGNOSIS — E8779 Other fluid overload: Secondary | ICD-10-CM

## 2021-09-19 DIAGNOSIS — Z9981 Dependence on supplemental oxygen: Secondary | ICD-10-CM | POA: Diagnosis not present

## 2021-09-19 DIAGNOSIS — R0609 Other forms of dyspnea: Secondary | ICD-10-CM | POA: Diagnosis not present

## 2021-09-19 DIAGNOSIS — Z87891 Personal history of nicotine dependence: Secondary | ICD-10-CM

## 2021-09-19 DIAGNOSIS — Z885 Allergy status to narcotic agent status: Secondary | ICD-10-CM

## 2021-09-19 DIAGNOSIS — I2510 Atherosclerotic heart disease of native coronary artery without angina pectoris: Secondary | ICD-10-CM | POA: Diagnosis present

## 2021-09-19 DIAGNOSIS — Z7982 Long term (current) use of aspirin: Secondary | ICD-10-CM

## 2021-09-19 DIAGNOSIS — I428 Other cardiomyopathies: Secondary | ICD-10-CM | POA: Diagnosis present

## 2021-09-19 DIAGNOSIS — Z79899 Other long term (current) drug therapy: Secondary | ICD-10-CM

## 2021-09-19 DIAGNOSIS — Z888 Allergy status to other drugs, medicaments and biological substances status: Secondary | ICD-10-CM

## 2021-09-19 DIAGNOSIS — Z8249 Family history of ischemic heart disease and other diseases of the circulatory system: Secondary | ICD-10-CM

## 2021-09-19 DIAGNOSIS — J9622 Acute and chronic respiratory failure with hypercapnia: Secondary | ICD-10-CM | POA: Diagnosis present

## 2021-09-19 DIAGNOSIS — Z90710 Acquired absence of both cervix and uterus: Secondary | ICD-10-CM

## 2021-09-19 DIAGNOSIS — G629 Polyneuropathy, unspecified: Secondary | ICD-10-CM | POA: Diagnosis present

## 2021-09-19 DIAGNOSIS — Z91048 Other nonmedicinal substance allergy status: Secondary | ICD-10-CM

## 2021-09-19 DIAGNOSIS — Z8261 Family history of arthritis: Secondary | ICD-10-CM

## 2021-09-19 LAB — I-STAT ARTERIAL BLOOD GAS, ED
Acid-Base Excess: 1 mmol/L (ref 0.0–2.0)
Bicarbonate: 27.3 mmol/L (ref 20.0–28.0)
Calcium, Ion: 1.25 mmol/L (ref 1.15–1.40)
HCT: 32 % — ABNORMAL LOW (ref 36.0–46.0)
Hemoglobin: 10.9 g/dL — ABNORMAL LOW (ref 12.0–15.0)
O2 Saturation: 96 %
Patient temperature: 98
Potassium: 3.8 mmol/L (ref 3.5–5.1)
Sodium: 143 mmol/L (ref 135–145)
TCO2: 29 mmol/L (ref 22–32)
pCO2 arterial: 48.3 mmHg — ABNORMAL HIGH (ref 32–48)
pH, Arterial: 7.359 (ref 7.35–7.45)
pO2, Arterial: 82 mmHg — ABNORMAL LOW (ref 83–108)

## 2021-09-19 LAB — COMPREHENSIVE METABOLIC PANEL
ALT: 17 U/L (ref 0–44)
AST: 20 U/L (ref 15–41)
Albumin: 3.5 g/dL (ref 3.5–5.0)
Alkaline Phosphatase: 70 U/L (ref 38–126)
Anion gap: 9 (ref 5–15)
BUN: 27 mg/dL — ABNORMAL HIGH (ref 8–23)
CO2: 25 mmol/L (ref 22–32)
Calcium: 8.7 mg/dL — ABNORMAL LOW (ref 8.9–10.3)
Chloride: 110 mmol/L (ref 98–111)
Creatinine, Ser: 1.09 mg/dL — ABNORMAL HIGH (ref 0.44–1.00)
GFR, Estimated: 56 mL/min — ABNORMAL LOW (ref >60)
Glucose, Bld: 86 mg/dL (ref 70–99)
Potassium: 3.8 mmol/L (ref 3.5–5.1)
Sodium: 144 mmol/L (ref 135–145)
Total Bilirubin: 0.4 mg/dL (ref 0.3–1.2)
Total Protein: 5.7 g/dL — ABNORMAL LOW (ref 6.5–8.1)

## 2021-09-19 LAB — TROPONIN I (HIGH SENSITIVITY)
Troponin I (High Sensitivity): 181 ng/L (ref <18)
Troponin I (High Sensitivity): 169 ng/L (ref <18)

## 2021-09-19 LAB — CBC
HCT: 35.9 % — ABNORMAL LOW (ref 36.0–46.0)
Hemoglobin: 11 g/dL — ABNORMAL LOW (ref 12.0–15.0)
MCH: 31.1 pg (ref 26.0–34.0)
MCHC: 30.6 g/dL (ref 30.0–36.0)
MCV: 101.4 fL — ABNORMAL HIGH (ref 80.0–100.0)
Platelets: 278 10*3/uL (ref 150–400)
RBC: 3.54 MIL/uL — ABNORMAL LOW (ref 3.87–5.11)
RDW: 15.4 % (ref 11.5–15.5)
WBC: 16.5 10*3/uL — ABNORMAL HIGH (ref 4.0–10.5)
nRBC: 0 % (ref 0.0–0.2)

## 2021-09-19 LAB — BRAIN NATRIURETIC PEPTIDE: B Natriuretic Peptide: 357.2 pg/mL — ABNORMAL HIGH (ref 0.0–100.0)

## 2021-09-19 MED ORDER — CALCIUM CARBONATE 1250 (500 CA) MG PO TABS
1.0000 | ORAL_TABLET | Freq: Every day | ORAL | Status: DC
  Administered 2021-09-20 – 2021-09-24 (×5): 500 mg via ORAL
  Filled 2021-09-19 (×6): qty 1

## 2021-09-19 MED ORDER — FUROSEMIDE 20 MG PO TABS: 40.0000 mg | ORAL_TABLET | Freq: Every day | ORAL | Status: DC | PRN

## 2021-09-19 MED ORDER — VITAMIN D 25 MCG (1000 UNIT) PO TABS
5000.0000 [IU] | ORAL_TABLET | Freq: Every day | ORAL | Status: DC
  Administered 2021-09-19 – 2021-09-24 (×6): 5000 [IU] via ORAL
  Filled 2021-09-19 (×6): qty 5

## 2021-09-19 MED ORDER — ONDANSETRON HCL 4 MG PO TABS: 4.0000 mg | ORAL_TABLET | Freq: Four times a day (QID) | ORAL | Status: DC | PRN

## 2021-09-19 MED ORDER — LOSARTAN POTASSIUM 25 MG PO TABS
25.0000 mg | ORAL_TABLET | Freq: Every day | ORAL | Status: DC
Start: 2021-09-19 — End: 2021-09-24
  Administered 2021-09-19 – 2021-09-24 (×6): 25 mg via ORAL
  Filled 2021-09-19 (×6): qty 1

## 2021-09-19 MED ORDER — METHYLPREDNISOLONE SODIUM SUCC 40 MG IJ SOLR
40.0000 mg | Freq: Two times a day (BID) | INTRAMUSCULAR | Status: AC
  Administered 2021-09-19 – 2021-09-20 (×2): 40 mg via INTRAVENOUS
  Filled 2021-09-19 (×2): qty 1

## 2021-09-19 MED ORDER — ACETAMINOPHEN 500 MG PO TABS
500.0000 mg | ORAL_TABLET | Freq: Two times a day (BID) | ORAL | Status: DC | PRN
Start: 2021-09-19 — End: 2021-09-24
  Administered 2021-09-19: 500 mg via ORAL
  Filled 2021-09-19: qty 1

## 2021-09-19 MED ORDER — LACTATED RINGERS IV BOLUS: 250.0000 mL | Freq: Once | INTRAVENOUS | Status: DC

## 2021-09-19 MED ORDER — FUROSEMIDE 10 MG/ML IJ SOLN
40.0000 mg | Freq: Every day | INTRAMUSCULAR | Status: DC
  Administered 2021-09-20: 40 mg via INTRAVENOUS
  Filled 2021-09-19: qty 4

## 2021-09-19 MED ORDER — BISOPROLOL FUMARATE 5 MG PO TABS
5.0000 mg | ORAL_TABLET | Freq: Every day | ORAL | Status: DC
  Administered 2021-09-20 – 2021-09-24 (×5): 5 mg via ORAL
  Filled 2021-09-19 (×5): qty 1

## 2021-09-19 MED ORDER — GUAIFENESIN ER 600 MG PO TB12
1200.0000 mg | ORAL_TABLET | Freq: Two times a day (BID) | ORAL | Status: DC
  Administered 2021-09-19 – 2021-09-24 (×10): 1200 mg via ORAL
  Filled 2021-09-19 (×11): qty 2

## 2021-09-19 MED ORDER — MAGNESIUM OXIDE -MG SUPPLEMENT 400 (240 MG) MG PO TABS
400.0000 mg | ORAL_TABLET | Freq: Every day | ORAL | Status: DC
  Administered 2021-09-19 – 2021-09-24 (×6): 400 mg via ORAL
  Filled 2021-09-19 (×7): qty 1

## 2021-09-19 MED ORDER — ONDANSETRON HCL 4 MG/2ML IJ SOLN: 4.0000 mg | Freq: Four times a day (QID) | INTRAMUSCULAR | Status: DC | PRN

## 2021-09-19 MED ORDER — OLOPATADINE HCL 0.1 % OP SOLN
1.0000 [drp] | Freq: Every day | OPHTHALMIC | Status: DC
  Administered 2021-09-20 – 2021-09-24 (×5): 1 [drp] via OPHTHALMIC
  Filled 2021-09-19: qty 5

## 2021-09-19 MED ORDER — BUTALBITAL-APAP-CAFFEINE 50-325-40 MG PO TABS
1.0000 | ORAL_TABLET | Freq: Four times a day (QID) | ORAL | Status: DC | PRN
  Administered 2021-09-23: 1 via ORAL
  Filled 2021-09-19: qty 1

## 2021-09-19 MED ORDER — ALPRAZOLAM 0.5 MG PO TABS
0.5000 mg | ORAL_TABLET | Freq: Three times a day (TID) | ORAL | Status: DC | PRN
Start: 2021-09-19 — End: 2021-09-24
  Administered 2021-09-19 – 2021-09-23 (×6): 0.5 mg via ORAL
  Filled 2021-09-19 (×6): qty 1

## 2021-09-19 MED ORDER — MOMETASONE FURO-FORMOTEROL FUM 200-5 MCG/ACT IN AERO
2.0000 | INHALATION_SPRAY | Freq: Two times a day (BID) | RESPIRATORY_TRACT | Status: DC
  Filled 2021-09-19 (×2): qty 8.8

## 2021-09-19 MED ORDER — BISOPROLOL FUMARATE 5 MG PO TABS: 2.5000 mg | ORAL_TABLET | Freq: Every day | ORAL | Status: DC

## 2021-09-19 MED ORDER — ASPIRIN 81 MG PO TBEC
81.0000 mg | DELAYED_RELEASE_TABLET | Freq: Every day | ORAL | Status: DC
  Administered 2021-09-19: 81 mg via ORAL
  Filled 2021-09-19 (×2): qty 1

## 2021-09-19 MED ORDER — NITROGLYCERIN 0.4 MG SL SUBL: 0.4000 mg | SUBLINGUAL_TABLET | SUBLINGUAL | Status: DC | PRN

## 2021-09-19 MED ORDER — FUROSEMIDE 10 MG/ML IJ SOLN
40.0000 mg | Freq: Once | INTRAMUSCULAR | Status: AC
  Administered 2021-09-19: 40 mg via INTRAVENOUS
  Filled 2021-09-19: qty 4

## 2021-09-19 MED ORDER — PAROXETINE HCL 30 MG PO TABS
30.0000 mg | ORAL_TABLET | Freq: Every day | ORAL | Status: DC
  Administered 2021-09-20 – 2021-09-24 (×5): 30 mg via ORAL
  Filled 2021-09-19 (×6): qty 1

## 2021-09-19 MED ORDER — PREDNISONE 20 MG PO TABS
40.0000 mg | ORAL_TABLET | Freq: Every day | ORAL | Status: DC
  Administered 2021-09-21: 40 mg via ORAL
  Filled 2021-09-19: qty 2

## 2021-09-19 MED ORDER — METHYLPREDNISOLONE SODIUM SUCC 125 MG IJ SOLR
125.0000 mg | Freq: Once | INTRAMUSCULAR | Status: AC
  Administered 2021-09-19: 125 mg via INTRAVENOUS
  Filled 2021-09-19: qty 2

## 2021-09-19 MED ORDER — CALCIUM CARB-CHOLECALCIFEROL 600-5 MG-MCG PO TABS: 1.0000 | ORAL_TABLET | Freq: Every day | ORAL | Status: DC

## 2021-09-19 MED ORDER — IPRATROPIUM-ALBUTEROL 0.5-2.5 (3) MG/3ML IN SOLN
3.0000 mL | Freq: Four times a day (QID) | RESPIRATORY_TRACT | Status: DC | PRN
Start: 2021-09-19 — End: 2021-09-20
  Administered 2021-09-19 – 2021-09-20 (×4): 3 mL via RESPIRATORY_TRACT
  Filled 2021-09-19 (×4): qty 3

## 2021-09-19 NOTE — Consult Note (Signed)
Cardiology Consultation:   Patient ID: Kim [name] MRN: [id number]; DOB: Jun 26, 1955  Admit date: 09/19/2021 Date of Consult: 09/19/2021  PCP:  [name], NP   [facility] Providers Cardiologist:  Lauree Chandler, MD   {   Patient Profile:   Kim [name] is a 66 y.o. female with PMH of non-obstructive CAD, NICM, Takotsubo cardiomyopathy with improved LV function, chronic hypoxic respiratory failure on 2 to 3 L oxygen, COPD, hypertension, hyperlipidemia, syncope, subarachnoid hemorrhage, depression with anxiety, who is being seen 09/19/2021 for the evaluation of CHF at the request of Dr. Jeanell Sparrow.  History of Present Illness:   Ms. Bonneau follows Dr Angelena Form outpatient for cardiology. She has hx of recurrent Takotsubo cardiomyopathy dating back to hospitalizations in 2015, 06/2015, 11/2020, and  08/2021. Each episode was occurred in the setting of acute respiratory distress 2/2 COPD exacerbation.   She has had recurrent cardiac cath with last LHC one 11/19/20 showed mild plaquing of the proximal LAD with minimal luminal narrowing.  Otherwise, no angiographically significant coronary artery disease. Findings are similar to prior catheterization in 2017 and presumably due to stress-induced cardiomyopathy.  Echo from 07/06/15 with LVEF 25-30%, grade I DD, moderate AR. Echo from 10/05/15 with recovery of LVEF 55-60%. Echo from 09/03/16 showed further recover of LVEF to 60-65%. Echo from 11/17/20 showed recurrent dropping of LVEF to 30-35%. Echo from 12/19/20 showed recovery of LVEF to 55-60%. Echo from 08/26/21 showed LVEF dropping again to 35-40%.   She had event monitor 07/2015 which showed sinus, PACs, PVCs.  She is historically treated with GDMT including losartan, toprolol XL, and lasix.   She was recently hospitalized  08/26/21-08/29/21  for COPD exacerbation, found to have subdural hematoma following syncope, no NSGY intervention recommended. Syncope was felt due to orthostasis, she  had no chest pain and appeared euvolemic from CHF standpoint, recommended to continue toprolol and losartan. Echo as above 08/26/21. Trop elevation was felt demand ischemia.   She was hospitalized again 09/17/21-09/18/21 for COPD exacerbation. Cardiology consulted for elevated troponin to 200s that was felt demand ischemia due to COPD exacerbation. Her BP was limited for adding additional GDMT.   She returned to ER today: Patient had an episode where she could not catch her breath.  No chest pain.  Took two Duonebs with no improvements.  Took Xanax with no improved.  Does feels like she has swollen a bit more with the steroids.  Her BP during this time was 220/110.  EMS was called.  She has not started her new Dupixent yet.   c/o SOB, weight gain of 2-3 pounds. Her breathing was not improving with Neb treatment. She also had a cough.   Diagnostic today showed Cr 1.09 and GFR 56. ABG PH 7.35, CO2 48, O2 82. BNP elevated 357. Hs trop 181. Hgb 10.9. CXR without acute findings similar to 09/17/21. CT chest showed no acute findings in the chest, abdomen or pelvis, including no evidence of pneumonia.     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   OSTEOARTHRITIS 06/05/2009   PARESTHESIA 08/24/2007   TOBACCO ABUSE 01/25/2010    Past Surgical History:  Procedure Laterality Date   ABDOMINAL HYSTERECTOMY     APPENDECTOMY  1969   CARDIAC CATHETERIZATION N/A 07/05/2015   Procedure: Left Heart Cath and Coronary Angiography;  Surgeon: Leonie Man, MD;  Location: [facility];  Service: Cardiovascular;  Laterality: N/A;   CHOLECYSTECTOMY  1989   collapse lung  1990   COLONOSCOPY WITH PROPOFOL N/A 05/14/2018   Procedure: COLONOSCOPY WITH PROPOFOL;  Surgeon: Irene Shipper, MD;  Location: [facility];  Service: Endoscopy;  Laterality: N/A;   EXCISION  MASS ABDOMINAL  2019   EXPLORATORY LAPAROTOMY  1969   LEFT HEART CATH AND CORONARY ANGIOGRAPHY N/A 11/19/2020   Procedure: LEFT HEART CATH AND CORONARY ANGIOGRAPHY;  Surgeon: [name], MD;  Location: [facility];  Service: Cardiovascular;  Laterality: N/A;   POLYPECTOMY  05/14/2018   Procedure: POLYPECTOMY;  Surgeon: Irene Shipper, MD;  Location: [facility];  Service: Endoscopy;;   SALPINGOOPHORECTOMY  2019     Home Medications:  Prior to Admission medications   Medication Sig Start Date End Date Taking? Authorizing Provider  acetaminophen (TYLENOL) 500 MG tablet Take 500 mg by mouth 2 (two) times daily as needed for headache.   Yes [provider]  albuterol (VENTOLIN HFA) 108 (90 Base) MCG/ACT inhaler INHALE TWO PUFFS INTO LUNGS EVERY 4 TO 6 HOURS AS NEEDED FOR SHORTNESS OF BREATH/WHEEZING. Patient taking differently: Inhale 2 puffs into the lungs every 4 (four) hours as needed for wheezing or shortness of breath. 07/26/21  Yes Martyn Ehrich, NP  ALPRAZolam Duanne Moron) 0.5 MG tablet Take 1 tablet (0.5 mg total) by mouth 3 (three) times daily as needed for anxiety. 09/05/21 10/05/21 Yes Nafziger, Tommi Rumps, NP  aspirin EC 81 MG EC tablet Take 1 tablet (81 mg total) by mouth daily. Patient taking differently: Take 81 mg by mouth 3 days. 07/08/15  Yes Nita Sells, MD  butalbital-acetaminophen-caffeine (FIORICET) 50-325-40 MG tablet Take 1 tablet by mouth every 6 (six) hours as needed for headache. 09/05/21 10/05/21 Yes Nafziger, Tommi Rumps, NP  Calcium Carb-Cholecalciferol (CALCIUM 600 + D PO) Take 600 mg by mouth in the morning and at bedtime. Calcium 600 mg + 4000 unit D3   Yes [provider]  cetirizine (ZYRTEC) 10 MG tablet Take 10 mg by mouth daily.   Yes [provider]  Cholecalciferol (VITAMIN D3 PO) Take 5,000 Units by mouth daily.   Yes [provider]  furosemide (LASIX) 40 MG tablet Take 1 tablet (40 mg total) by mouth daily as needed (for an  overnight weight gain of 3-5 pounds of fluid). 08/16/21  Yes [name], MD  Ginkgo Biloba Extract 60 MG CAPS Take 1 capsule by mouth daily. 08/29/21  Yes [provider]  HYDROcodone-acetaminophen (NORCO) 7.5-325 MG tablet Take 1 tablet by mouth every 6 (six) hours as needed for moderate pain. 08/29/21  Yes Ghimire, Henreitta Leber, MD  ipratropium-albuterol (DUONEB) 0.5-2.5 (3) MG/3ML SOLN USE 3 ML VIA NEBULIZER EVERY 6 HOURS AS NEEDED.  DX: J44.9 Patient taking differently: Take 3 mLs by nebulization. 3 ml nebulization five times a day as needed for shortness of breath 07/09/21  Yes Margaretha Seeds, MD  lidocaine (LIDODERM) 5 % Place 1 patch onto the skin daily. Remove & Discard patch within 12 hours or as directed by MD Patient taking differently: Place 1 patch onto the skin every other day. Remove & Discard patch within 12 hours or as directed by MD 08/29/21  Yes Ghimire, Henreitta Leber, MD  losartan (COZAAR) 25 MG tablet Take 1 tablet (25 mg total) by mouth daily. 04/26/21  Yes Weaver, Scott T, PA-C  magnesium oxide (MAG-OX) 400 MG tablet Take 400 mg by mouth daily.  Yes [provider]  MUCINEX MAXIMUM STRENGTH 1200 MG TB12 Take 1,200 mg by mouth every 12 (twelve) hours.   Yes [provider]  nitroGLYCERIN (NITROSTAT) 0.4 MG SL tablet Place 1 tablet (0.4 mg total) under the tongue every 5 (five) minutes as needed for chest pain. 10/24/20  Yes [name], MD  nystatin (MYCOSTATIN) 100000 UNIT/ML suspension Take 5 mLs by mouth 3 (three) times daily as needed (thrush). 08/05/21  Yes [provider]  Olopatadine HCl (PATADAY OP) Place 1 drop into the left eye daily.   Yes [provider]  ondansetron (ZOFRAN) 4 MG tablet Take 1 tablet (4 mg total) by mouth every 8 (eight) hours as needed for nausea or vomiting. 09/05/21  Yes Nafziger, Tommi Rumps, NP  OXYGEN Inhale 2 L/min into the lungs continuous.   Yes [provider]  PARoxetine (PAXIL) 30  MG tablet Take 1 tablet (30 mg total) by mouth daily. 06/11/21  Yes Nafziger, Tommi Rumps, NP  potassium chloride (KLOR-CON) 10 MEQ tablet Take 1 tablet (10 mEq total) by mouth as needed (take with furosemide (lasix)). 09/12/21  Yes Swinyer, Lanice Schwab, NP  predniSONE (DELTASONE) 10 MG tablet Take 1 tablet (10 mg total) by mouth daily. 09/21/21  Yes Little Ishikawa, MD  SYMBICORT 160-4.5 MCG/ACT inhaler INHALE 2 PUFFS TWICE DAILY Patient taking differently: 2 puffs 2 (two) times daily. 12/31/20  Yes Martyn Ehrich, NP  Tiotropium Bromide Monohydrate (SPIRIVA RESPIMAT) 2.5 MCG/ACT AERS Inhale 2 puffs into the lungs daily. Patient taking differently: Inhale 2 puffs into the lungs daily as needed (wheezing). 12/06/20  Yes Martyn Ehrich, NP  bisoprolol (ZEBETA) 5 MG tablet Take 0.5 tablets (2.5 mg total) by mouth daily. 09/19/21   Little Ishikawa, MD  predniSONE (DELTASONE) 20 MG tablet Take 1 tablet (20 mg total) by mouth daily with breakfast for 2 days. 09/19/21 09/21/21  Little Ishikawa, MD  Spacer/Aero Chamber Mouthpiece MISC 1 Device by Does not apply route as directed. 01/12/17   Javier Glazier, MD    Inpatient Medications: Scheduled Meds:  bisoprolol  2.5 mg Oral Daily   Calcium Carb-Cholecalciferol  1 tablet Oral Daily   cholecalciferol  5,000 Units Oral Daily   Guaifenesin  1,200 mg Oral Q12H   losartan  25 mg Oral Daily   magnesium oxide  400 mg Oral Daily   methylPREDNISolone (SOLU-MEDROL) injection  40 mg Intravenous Q12H   Followed by   Derrill Memo ON 09/21/2021] predniSONE  40 mg Oral Q breakfast   mometasone-formoterol  2 puff Inhalation BID   olopatadine  1 drop Left Eye Daily   PARoxetine  30 mg Oral Daily   Continuous Infusions:  lactated ringers Stopped (09/19/21 1446)   PRN Meds:   Allergies:    Allergies  Allergen Reactions   Tape Other (See Comments)    Skin tears easily, peels skin off   Daliresp [Roflumilast]     Skin peeling   Latex Other (See Comments)     Peels skin off   Lisinopril Cough   Morphine And Related     hallucinations   Penicillins Hives and Other (See Comments)    Tolerates Omnicef Has patient had a PCN reaction causing immediate rash, facial/tongue/throat swelling, SOB or lightheadedness with hypotension: Yes Has patient had a PCN reaction causing severe rash involving mucus membranes or skin necrosis: No Has patient had a PCN reaction that required hospitalization No Has patient had a PCN reaction occurring within the last 10  years: No If all of the above answers are "NO", then may proceed with Cephalosporin use.    Omnicef [Cefdinir] Rash    Social History:   Social History   Socioeconomic History   Marital status: Married    Spouse name: [name]   Number of children: 2   Years of education: Not on file   Highest education level: Not on file  Occupational History   Occupation: Patient care aide-sits with elderly lady  Tobacco Use   Smoking status: Former    Packs/day: 2.00    Years: 38.00    Total pack years: 76.00    Types: Cigarettes    Quit date: 03/07/2014    Years since quitting: 7.5   Smokeless tobacco: Never  Vaping Use   Vaping Use: Never used  Substance and Sexual Activity   Alcohol use: No    Alcohol/week: 0.0 standard drinks of alcohol   Drug use: No   Sexual activity: Not on file  Other Topics Concern   Not on file  Social History Narrative   [facility] (01/12/17):   Originally from [facility]. Has lived in [place] for 17 years. Married. Has 2 dogs currently. No mold and only 1 indoor plant. Had her home professionally cleaned a month ago. No bird or hot tub exposure. 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    Right handed    Social Determinants of Health   Financial Resource Strain: High Risk (09/10/2021)   Overall Financial Resource Strain (CARDIA)    Difficulty of Paying Living Expenses: Very hard  Food Insecurity: No Food Insecurity (09/10/2021)   Hunger Vital  Sign    Worried About Running Out of Food in the Last Year: Never true    Ran Out of Food in the Last Year: Never true  Transportation Needs: No Transportation Needs (09/10/2021)   PRAPARE - Hydrologist (Medical): No    Lack of Transportation (Non-Medical): No  Physical Activity: Inactive (03/25/2021)   Exercise Vital Sign    Days of Exercise per Week: 0 days    Minutes of Exercise per Session: 0 min  Stress: No Stress Concern Present (09/10/2021)   [employer]    Feeling of Stress : Not at all  Social Connections: Moderately Isolated (03/25/2021)   Social Connection and Isolation Panel [NHANES]    Frequency of Communication with Friends and Family: More than three times a week    Frequency of Social Gatherings with Friends and Family: More than three times a week    Attends Religious Services: Never    Marine scientist or Organizations: No    Attends Archivist Meetings: Never    Marital Status: Married  Human resources officer Violence: Not At Risk (03/25/2021)   Humiliation, Afraid, Rape, and Kick questionnaire    Fear of Current or Ex-Partner: No    Emotionally Abused: No    Physically Abused: No    Sexually Abused: No    Family History:    Family History  Problem Relation Age of Onset   Cancer Mother        lung   Heart attack Mother    Hypertension Sister    Multiple sclerosis Brother    Diabetes Sister    Rheum arthritis Sister    Thyroid disease Sister    Multiple sclerosis Other    Alcohol abuse Other    Arthritis Other  Diabetes Other    Kidney disease Other    Cancer Other        lung,ovarian,skin, uterine   Stroke Other    Heart disease Other    Melanoma Other    Osteoporosis Other    Heart attack Maternal Uncle    Heart attack Other        NEICE   Heart attack Other        NEPHEW   Hypertension Brother    Stroke Maternal Grandmother    Colon  cancer Neg Hx      ROS:  Please see the history of present illness.  All other ROS reviewed and negative.     Physical Exam/Data:   Vitals:   09/19/21 1215 09/19/21 1230 09/19/21 1245 09/19/21 1300  BP: 117/60 106/63 (!) 110/59 (!) 115/56  Pulse: 79 80 81 80  Resp: 20 (!) '21 20 20  '$ Temp:      TempSrc:      SpO2: 100% 100% 100% 100%  Weight:      Height:       No intake or output data in the 24 hours ending 09/19/21 1628    09/19/2021   10:50 AM 09/17/2021    4:21 PM 09/17/2021    4:49 AM  Last 3 Weights  Weight (lbs) 144 lb 144 lb 12.8 oz 142 lb 13.7 oz  Weight (kg) 65.318 kg 65.681 kg 64.8 kg     Body mass index is 26.55 kg/m.   Gen: Mild distress  Neck: No JVD Cardiac: No Rubs or Gallops, no Murmur, RRR +2 radial pulses Respiratory: Inspiratory and expiratory wheezes, normal effort, slight tachypnea (steroids given at 1125) GI: Soft, nontender, non-distended  MS: Non-pitting edema;  moves all extremities Integument: Skin feels warm Neuro:  At time of evaluation, alert and oriented to person/place/time/situation  Psych: Normal affect, patient feels optimistic  EKG:  The EKG was personally reviewed and demonstrates:  Sinus rhythm with non-specific T wave abnormalities   Telemetry:  Telemetry was personally reviewed and demonstrates:  sinus rhythm  Relevant CV Studies:  Echo from 08/26/21:    1. Left ventricular ejection fraction, by estimation, is 35 to 40%. The  left ventricle has moderately decreased function. The left ventricle  demonstrates regional wall motion abnormalities (see scoring  diagram/findings for description). There is  hypokinesis of all mid LV segments although appears slightly worse in the  mid septal LV segments. The apical LV segments appear hyperkinetic. Wall  motion suggestive of atypical stress cardiomyopathy as does not appear to  follow coronary artery pattern.  Clinical correlation recommended.   2. Left ventricular diastolic  parameters are consistent with Grade I  diastolic dysfunction (impaired relaxation).   3. Right ventricular systolic function is normal. The right ventricular  size is normal.   4. The mitral valve is normal in structure. Trivial mitral valve  regurgitation.   5. The aortic valve was not well visualized. Aortic valve regurgitation  is mild. Aortic valve sclerosis/calcification is present, without any  evidence of aortic stenosis.   6. The inferior vena cava is normal in size with greater than 50%  respiratory variability, suggesting right atrial pressure of 3 mmHg.    Laboratory Data:  High Sensitivity Troponin:   Recent Labs  Lab 09/17/21 0444 09/17/21 0633 09/17/21 1230 09/17/21 1408 09/19/21 1044  TROPONINIHS 42* 272* 445* 434* 181*     Chemistry Recent Labs  Lab 09/17/21 0444 09/17/21 0457 09/17/21 0500 09/17/21 1324 09/18/21 0109 09/19/21  1044 09/19/21 1126  NA 140   < > 138   < > 140 144 143  K 4.7   < > 4.5   < > 4.4 3.8 3.8  CL 106  --  105  --  106 110  --   CO2 23  --   --   --  24 25  --   GLUCOSE 325*  --  324*  --  142* 86  --   BUN 19  --  23  --  19 27*  --   CREATININE 1.10*  --  1.10*  --  0.78 1.09*  --   CALCIUM 8.9  --   --   --  8.6* 8.7*  --   GFRNONAA 55*  --   --   --  >60 56*  --   ANIONGAP 11  --   --   --  10 9  --    < > = values in this interval not displayed.    Recent Labs  Lab 09/17/21 0444 09/19/21 1044  PROT 5.6* 5.7*  ALBUMIN 3.4* 3.5  AST 19 20  ALT 16 17  ALKPHOS 90 70  BILITOT 0.4 0.4   Lipids No results for input(s): "CHOL", "TRIG", "HDL", "LABVLDL", "LDLCALC", "CHOLHDL" in the last 168 hours.  Hematology Recent Labs  Lab 09/17/21 0444 09/17/21 0457 09/18/21 0109 09/19/21 1044 09/19/21 1126  WBC 21.3*  --  15.0* 16.5*  --   RBC 3.75*  --  3.38* 3.54*  --   HGB 11.9*   < > 10.6* 11.0* 10.9*  HCT 38.6   < > 33.1* 35.9* 32.0*  MCV 102.9*  --  97.9 101.4*  --   MCH 31.7  --  31.4 31.1  --   MCHC 30.8  --  32.0  30.6  --   RDW 14.6  --  15.0 15.4  --   PLT 310  --  252 278  --    < > = values in this interval not displayed.   Thyroid No results for input(s): "TSH", "FREET4" in the last 168 hours.  BNP Recent Labs  Lab 09/17/21 0444 09/19/21 1127  BNP 175.0* 357.2*    DDimer No results for input(s): "DDIMER" in the last 168 hours.   Radiology/Studies:  CT CHEST ABDOMEN PELVIS WO CONTRAST  Result Date: 09/19/2021 CLINICAL DATA:  Shortness of breath and dizziness EXAM: CT CHEST, ABDOMEN AND PELVIS WITHOUT CONTRAST TECHNIQUE: Multidetector CT imaging of the chest, abdomen and pelvis was performed following the standard protocol without IV contrast. RADIATION DOSE REDUCTION: This exam was performed according to the departmental dose-optimization program which includes automated exposure control, adjustment of the mA and/or kV according to patient size and/or use of iterative reconstruction technique. COMPARISON:  CT abdomen and pelvis dated March 30, 2018; chest CT dated Aug 28, 2021 FINDINGS: CT CHEST FINDINGS Cardiovascular: Normal heart size. Trace pericardial fluid. Moderate coronary artery calcifications of the LAD and circumflex. Moderate atherosclerotic disease of the thoracic aorta. Mediastinum/Nodes: Small hiatal hernia. Thyroid is unremarkable. No pathologically enlarged lymph nodes seen in the chest. Lungs/Pleura: Central airways are patent. Severe centrilobular emphysema. No consolidation, pleural effusion or pneumothorax. Small bilateral solid pulmonary nodules, unchanged in size when compared with prior and favored to be benign. Musculoskeletal: Unchanged mild wedge compression deformities of the thoracic spine. No aggressive appearing osseous lesions. CT ABDOMEN PELVIS FINDINGS Hepatobiliary: No focal liver abnormality is seen. Status post cholecystectomy. No biliary dilatation. Pancreas: Unremarkable. No  pancreatic ductal dilatation or surrounding inflammatory changes. Spleen: Normal in size  without focal abnormality. 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 No hydronephrosis or nephrolithiasis. Bladder is unremarkable. Stomach/Bowel: Stomach is within normal limits. Mild diverticulosis. No evidence of bowel wall thickening, distention, or inflammatory changes. Vascular/Lymphatic: Aortic atherosclerosis. No enlarged abdominal or pelvic lymph nodes. Reproductive: No adnexal masses. Other: No abdominal wall hernia or abnormality. No abdominopelvic ascites. Musculoskeletal: No acute or significant osseous findings. IMPRESSION: 1. No acute findings in the chest, abdomen or pelvis, including no evidence of pneumonia. 2. Coronary artery calcifications, aortic Atherosclerosis (ICD10-I70.0) and Emphysema (ICD10-J43.9). Electronically Signed   By: Yetta Glassman M.D.   On: 09/19/2021 15:19   DG Chest Port 1 View  Result Date: 09/19/2021 CLINICAL DATA:  dyspnea EXAM: PORTABLE CHEST 1 VIEW COMPARISON:  June 13, 23. FINDINGS: No consolidation. No visible pleural effusions or pneumothorax. Hyperinflation, compatible with emphysema. Redemonstrated generator type device projects over left upper chest. IMPRESSION: 1. No evidence of acute cardiopulmonary disease. 2. Emphysema. Electronically Signed   By: Margaretha Sheffield M.D.   On: 09/19/2021 11:05   CT HEAD WO CONTRAST (5MM)  Result Date: 09/17/2021 CLINICAL DATA:  Dizziness EXAM: CT HEAD WITHOUT CONTRAST TECHNIQUE: Contiguous axial images were obtained from the base of the skull through the vertex without intravenous contrast. RADIATION DOSE REDUCTION: This exam was performed according to the departmental dose-optimization program which includes automated exposure control, adjustment of the mA and/or kV according to patient size and/or use of iterative reconstruction technique. COMPARISON:  08/26/2021 FINDINGS: Brain: No acute intracranial  findings are seen in noncontrast CT brain. There is interval resolution of small foci of subarachnoid hemorrhage adjacent to the falx. There are no signs of recent bleeding within the cranium. Cortical sulci are prominent. Vascular: Unremarkable. Skull: Unremarkable. Sinuses/Orbits: Unremarkable. Other: None. IMPRESSION: No acute intracranial findings are seen in noncontrast CT brain. There is interval resolution of small foci of subarachnoid hemorrhage adjacent to the falx since 08/26/2021. atrophy. Electronically Signed   By: Elmer Picker M.D.   On: 09/17/2021 13:20   DG Chest Port 1 View  Result Date: 09/17/2021 CLINICAL DATA:  66 year old female with shortness of breath, respiratory distress. EXAM: PORTABLE CHEST 1 VIEW COMPARISON:  Portable chest 08/26/2021 and earlier. Chest CT 08/28/2021 and earlier. FINDINGS: Centrilobular emphysema better demonstrated by CT last month. Portable AP semi upright view at 0451 hours. Stable lung volumes and mediastinal contours. No pneumothorax, pulmonary edema, pleural effusion, or confluent pulmonary opacity. 5 cm generator type device newly projects over the left upper chest, but no connected leads are identified. EKG leads elsewhere. Visualized tracheal air column is within normal limits. No acute osseous abnormality identified. IMPRESSION: Emphysema.  No acute cardiopulmonary abnormality. Electronically Signed   By: Genevie Ann M.D.   On: 09/17/2021 05:39     Assessment and Plan:   Acute on chronic hypoxic respiratory failure  Severe oxygen dependent COPD with recurrent exacerbation Leukocytosis (potentially steroid related) Anxiety with depression  Chronic systolic and diastolic heart failure  Hx of Takotsubo cardiomyopathy, recurrent  - I suspect most of this is COPD related - BNP elevation may be related to hypertensive spike from her event or given the stress of her recent admission her LVEF and well known Takotsubo cardiomyopathy may be related; we  will repeat an echocardiogram - will cautiously increase to 5 mg bisoprolol - 40 IV Lasix  today and tomorrow, may increase based on results - will continue her low dose ARB  Non-obstructive CAD - troponin continues to down-trend from prior - continue asa 81 mg PO daily   Risk Assessment/Risk Scores:   For questions or updates, please contact [facility] Please consult [email or web address] for contact info under    Signed, Werner Lean, MD  09/19/2021 4:28 PM

## 2021-09-19 NOTE — Assessment & Plan Note (Addendum)
Continue with bisoprolol and losartan.

## 2021-09-19 NOTE — Assessment & Plan Note (Addendum)
-  Stable -Continue oxygen supplementation during the day and the use of BiPAP at night. -Patient oriented x4 on examination.

## 2021-09-19 NOTE — Progress Notes (Signed)
RT setup BiPAP (dream station) 10/5 2L bleed in at bedside.  Patients husband at bedside and will assist patient when she is ready to place it on.  RT available if patient needs assistance.

## 2021-09-19 NOTE — ED Provider Notes (Signed)
[facility] Provider Note   CSN: [id number] Arrival date & time: 09/19/21  1032     History  Chief Complaint  Patient presents with  . Shortness of Breath    Kim [name] is a 66 y.o. female.  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  She states that she was discharged yesterday after COPD exacerbation.  She was doing well last night.  Awakening this morning she sat up and became dyspneic, had a tachycardia with heart rate in the 130s and was hypertensive with systolic blood pressure at 220.  She states she felt very anxious and took her Xanax x2.  EMS was called.  EMS states initial blood pressure was 140, heart rate 114, and oxygen saturations are normal.  She was was noted to be wheezing and received 2 nebulizers in route.  She had already taken 2 at home.  She continues to feel somewhat short of breath.  She denies any chest pain.  She does state that she has chronically elevated troponins and has not had chest pain when she has had those episodes before.  Review of discharge summary she notes that she was discharged with COPD exacerbation yesterday.  She has had a recent subarachnoid hemorrhage but has had resolved on interim CT with last admission.  She denies any headache or lateralized weakness.     Home Medications Prior to Admission medications   Medication Sig Start Date End Date Taking? Authorizing Provider  acetaminophen (TYLENOL) 500 MG tablet Take 500 mg by mouth 2 (two) times daily as needed for headache.   Yes [provider]  albuterol (VENTOLIN HFA) 108 (90 Base) MCG/ACT inhaler INHALE TWO PUFFS INTO LUNGS EVERY 4 TO 6 HOURS AS NEEDED FOR SHORTNESS OF BREATH/WHEEZING. Patient taking differently: Inhale 2 puffs into the lungs every 4 (four) hours as needed for wheezing or shortness of  breath. 07/26/21  Yes Martyn Ehrich, NP  ALPRAZolam Duanne Moron) 0.5 MG tablet Take 1 tablet (0.5 mg total) by mouth 3 (three) times daily as needed for anxiety. 09/05/21 10/05/21 Yes Nafziger, Tommi Rumps, NP  aspirin EC 81 MG EC tablet Take 1 tablet (81 mg total) by mouth daily. Patient taking differently: Take 81 mg by mouth 3 days. 07/08/15  Yes Nita Sells, MD  butalbital-acetaminophen-caffeine (FIORICET) 50-325-40 MG tablet Take 1 tablet by mouth every 6 (six) hours as needed for headache. 09/05/21 10/05/21 Yes Nafziger, Tommi Rumps, NP  Calcium Carb-Cholecalciferol (CALCIUM 600 + D PO) Take 600 mg by mouth in the morning and at bedtime. Calcium 600 mg + 4000 unit D3   Yes [provider]  cetirizine (ZYRTEC) 10 MG tablet Take 10 mg by mouth daily.   Yes [provider]  Cholecalciferol (VITAMIN D3 PO) Take 5,000 Units by mouth daily.   Yes [provider]  furosemide (LASIX) 40 MG tablet Take 1 tablet (40 mg total) by mouth daily as needed (for an overnight weight gain of 3-5 pounds of fluid). 08/16/21  Yes [name], MD  Ginkgo Biloba Extract 60 MG CAPS Take 1 capsule by mouth daily. 08/29/21  Yes [provider]  HYDROcodone-acetaminophen (NORCO) 7.5-325 MG tablet Take 1 tablet by mouth every 6 (six) hours as needed for moderate pain. 08/29/21  Yes Ghimire, Henreitta Leber, MD  ipratropium-albuterol (DUONEB) 0.5-2.5 (3) MG/3ML SOLN USE 3 ML VIA NEBULIZER EVERY 6 HOURS AS NEEDED.  DX: J44.9 Patient taking differently: Take 3 mLs by nebulization. 3 ml nebulization five times a day as needed for shortness of breath 07/09/21  Yes Margaretha Seeds, MD  lidocaine (LIDODERM) 5 % Place 1 patch onto the skin daily. Remove & Discard patch within 12 hours or as directed by MD Patient taking differently: Place 1 patch onto the skin every other day. Remove & Discard patch within 12 hours or as directed by MD 08/29/21  Yes Ghimire, Henreitta Leber, MD  losartan (COZAAR) 25 MG tablet Take 1  tablet (25 mg total) by mouth daily. 04/26/21  Yes Weaver, Scott T, PA-C  magnesium oxide (MAG-OX) 400 MG tablet Take 400 mg by mouth daily.   Yes [provider]  MUCINEX MAXIMUM STRENGTH 1200 MG TB12 Take 1,200 mg by mouth every 12 (twelve) hours.   Yes [provider]  nitroGLYCERIN (NITROSTAT) 0.4 MG SL tablet Place 1 tablet (0.4 mg total) under the tongue every 5 (five) minutes as needed for chest pain. 10/24/20  Yes [name], MD  nystatin (MYCOSTATIN) 100000 UNIT/ML suspension Take 5 mLs by mouth 3 (three) times daily as needed (thrush). 08/05/21  Yes [provider]  Olopatadine HCl (PATADAY OP) Place 1 drop into the left eye daily.   Yes [provider]  ondansetron (ZOFRAN) 4 MG tablet Take 1 tablet (4 mg total) by mouth every 8 (eight) hours as needed for nausea or vomiting. 09/05/21  Yes Nafziger, Tommi Rumps, NP  OXYGEN Inhale 2 L/min into the lungs continuous.   Yes [provider]  PARoxetine (PAXIL) 30 MG tablet Take 1 tablet (30 mg total) by mouth daily. 06/11/21  Yes Nafziger, Tommi Rumps, NP  potassium chloride (KLOR-CON) 10 MEQ tablet Take 1 tablet (10 mEq total) by mouth as needed (take with furosemide (lasix)). 09/12/21  Yes Swinyer, Lanice Schwab, NP  predniSONE (DELTASONE) 10 MG tablet Take 1 tablet (10 mg total) by mouth daily. 09/21/21  Yes Little Ishikawa, MD  SYMBICORT 160-4.5 MCG/ACT inhaler INHALE 2 PUFFS TWICE DAILY Patient taking differently: 2 puffs 2 (two) times daily. 12/31/20  Yes Martyn Ehrich, NP  Tiotropium Bromide Monohydrate (SPIRIVA RESPIMAT) 2.5 MCG/ACT AERS Inhale 2 puffs into the lungs daily. Patient taking differently: Inhale 2 puffs into the lungs daily as needed (wheezing). 12/06/20  Yes Martyn Ehrich, NP  bisoprolol (ZEBETA) 5 MG tablet Take 0.5 tablets (2.5 mg total) by mouth daily. 09/19/21   Little Ishikawa, MD  predniSONE (DELTASONE) 20 MG tablet Take 1 tablet (20 mg total) by mouth daily with  breakfast for 2 days. 09/19/21 09/21/21  Little Ishikawa, MD  Spacer/Aero Chamber Mouthpiece MISC 1 Device by Does not apply route as directed. 01/12/17   Javier Glazier, MD      Allergies    Tape, Daliresp [roflumilast], Latex, Lisinopril, Morphine and related, Penicillins, and Omnicef [cefdinir]    Review of Systems   Review of Systems  Physical Exam Updated Vital Signs BP (!) 115/56   Pulse 80   Temp 98 F (36.7 C) (Oral)   Resp 20   Ht 1.568 m (5' 1.75")   Wt 65.3 kg   SpO2 100%   BMI 26.55 kg/m  Physical Exam Vitals and nursing note reviewed.  Constitutional:      General: She is in acute distress.     Appearance: She is ill-appearing.  HENT:     Head: Normocephalic.     Mouth/Throat:     Pharynx: Oropharynx is  clear.  Eyes:     Pupils: Pupils are equal, round, and reactive to light.  Cardiovascular:     Rate and Rhythm: Tachycardia present.  Pulmonary:     Effort: Tachypnea and respiratory distress present.     Breath sounds: Wheezing present. No decreased breath sounds.     Comments: Patient with stridorous breath sounds which are to be radiating to the lower lobes Abdominal:     General: Bowel sounds are normal.     Palpations: Abdomen is soft.  Musculoskeletal:        General: Normal range of motion.     Cervical back: Normal range of motion.     Right lower leg: No edema.     Left lower leg: No edema.  Skin:    General: Skin is warm and dry.     Capillary Refill: Capillary refill takes less than 2 seconds.  Neurological:     General: No focal deficit present.     Mental Status: She is alert.  Psychiatric:        Mood and Affect: Mood normal.    ED Results / Procedures / Treatments   Labs (all labs ordered are listed, but only abnormal results are displayed) Labs Reviewed  CBC - Abnormal; Notable for the following components:      Result Value   WBC 16.5 (*)    RBC 3.54 (*)    Hemoglobin 11.0 (*)    HCT 35.9 (*)    MCV 101.4 (*)     All other components within normal limits  COMPREHENSIVE METABOLIC PANEL - Abnormal; Notable for the following components:   BUN 27 (*)    Creatinine, Ser 1.09 (*)    Calcium 8.7 (*)    Total Protein 5.7 (*)    GFR, Estimated 56 (*)    All other components within normal limits  BRAIN NATRIURETIC PEPTIDE - Abnormal; Notable for the following components:   B Natriuretic Peptide 357.2 (*)    All other components within normal limits  I-STAT ARTERIAL BLOOD GAS, ED - Abnormal; Notable for the following components:   pCO2 arterial 48.3 (*)    pO2, Arterial 82 (*)    HCT 32.0 (*)    Hemoglobin 10.9 (*)    All other components within normal limits  TROPONIN I (HIGH SENSITIVITY) - Abnormal; Notable for the following components:   Troponin I (High Sensitivity) 181 (*)    All other components within normal limits  TROPONIN I (HIGH SENSITIVITY)    EKG EKG Interpretation  Date/Time:  Thursday September 19 2021 10:46:36 EDT Ventricular Rate:  96 PR Interval:  139 QRS Duration: 69 QT Interval:  364 QTC Calculation: 460 R Axis:   64 Text Interpretation: Sinus rhythm Nonspecific T abnrm, anterolateral leads Since last tracing QRS complex has narrowed Confirmed by Pattricia Boss [phone]) on 09/19/2021 12:15:57 PM  Radiology DG Chest Port 1 View  Result Date: 09/19/2021 CLINICAL DATA:  dyspnea EXAM: PORTABLE CHEST 1 VIEW COMPARISON:  June 13, 23. FINDINGS: No consolidation. No visible pleural effusions or pneumothorax. Hyperinflation, compatible with emphysema. Redemonstrated generator type device projects over left upper chest. IMPRESSION: 1. No evidence of acute cardiopulmonary disease. 2. Emphysema. Electronically Signed   By: Margaretha Sheffield M.D.   On: 09/19/2021 11:05    Procedures Procedures    Medications Ordered in ED Medications  lactated ringers bolus 250 mL (has no administration in time range)  furosemide (LASIX) injection 40 mg (has no administration in time range)  methylPREDNISolone sodium succinate (SOLU-MEDROL) 125 mg/2 mL injection 125 mg (125 mg Intravenous Given 09/19/21 1125)    ED Course/ Medical Decision Making/ A&P Clinical Course as of 09/19/21 1415  Thu Sep 19, 2021  1214 Chest x-Lusine Corlett reviewed and interpreted and no evidence of acute abnormality or change from first prior and radiologist interpretation reviewed and concurs [DR]  1216 ABG reviewed and interpreted with pH of 7.35, PCO2 48, and PO2 82 [DR]  1342 BNP elevated at 357 [DR]  1343 BNP increased from less than 202 days ago [DR]    Clinical Course User Index [DR] Pattricia Boss, MD  Left Ventricle: There is hypokinesis of all mid LV segments although  appears slightly worse in the mid septal LV segments. The apical LV  segments appear hyperkinetic. 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 Clinical correlation recommended. Left  ventricular ejection fraction, by estimation, is 35 to 40%. The left  ventricle has moderately decreased function. The left ventricle  demonstrates regional wall motion abnormalities. Definity   contrast agent was given IV to delineate the left ventricular endocardial  borders. The left ventricular internal cavity size was normal in size.  There is no left ventricular hypertrophy. Left ventricular diastolic  parameters are consistent with Grade I  diastolic dysfunction (impaired relaxation).                          Medical Decision Making 42 female COPD recently discharged with reports of exacerbation of COPD and is also reported to have Takotsubo's myocarditis and elevated troponin.  Today presents with worsening dyspnea.  Chest x-Linsy Ehresman appears stable from prior.  Family member reports 15 pound weight gain.  Labs obtained mostly appear stable with downward trending troponin however BNP increased from 1 75-3 57 from several days ago. Echo reviewed from 523 with  hypokinesis of all mid LV  segments reported slightly worsened mid septal LV.  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  Left ventricle had moderately decreased EF.  And EF 35-40 Differential diagnosis includes but is not limited to COPD exacerbation, other infections of the lung, other pulmonary etiologies, cardiac etiologies such as CHF, decreased EF likely secondary to suspected Takotsubo.  Care discussed with cardiology Lasix milligrams ordered IV. Given patient's recent elevation of troponins and possible cardiac involvement, now with increasing BNP, suspect patient would benefit from diuresis and reevaluation of her EF. Cardiology was called and consulted Plan consult to hospitalist for readmission  Amount and/or Complexity of Data Reviewed Labs: ordered. Decision-making details documented in ED Course. Radiology: ordered and independent interpretation performed. Decision-making details documented in ED Course. Discussion of management or test interpretation with external provider(s): Discussed with cardiology Discussed with Dr. Francine Graven who will see for admission  Risk Prescription drug management. Decision regarding hospitalization.  Critical Care Total time providing critical care: 60 minutes           Final Clinical Impression(s) / ED Diagnoses Final diagnoses:  Dyspnea, unspecified type  Other hypervolemia    Rx / DC Orders ED Discharge Orders     None         Pattricia Boss, MD 09/19/21 1415

## 2021-09-19 NOTE — Assessment & Plan Note (Addendum)
Patient is clinically euvolemic today.   Continue with losartan for blood pressure control. PRN furosemide for signs of volume overload.

## 2021-09-19 NOTE — Assessment & Plan Note (Addendum)
-  Zio patch placed after patient's syncopal episode with  subarachnoid bleed. -Follow cardiology recommendations -low dose aspirin started  -follow carotid dopplers results.

## 2021-09-19 NOTE — Plan of Care (Signed)
  Problem: Education: Goal: Knowledge of General Education information will improve Description: Including pain rating scale, medication(s)/side effects and non-pharmacologic comfort measures Outcome: Completed/Met   Problem: Health Behavior/Discharge Planning: Goal: Ability to manage health-related needs will improve Outcome: Progressing   Problem: Clinical Measurements: Goal: Ability to maintain clinical measurements within normal limits will improve Outcome: Progressing Goal: Will remain free from infection Outcome: Progressing Goal: Diagnostic test results will improve Outcome: Progressing

## 2021-09-19 NOTE — H&P (Signed)
History and Physical    Patient: Kim [name] QPY:[id number] DOB: 02/15/1956 DOA: 09/19/2021 DOS: the patient was seen and examined on 09/19/2021 PCP: [name], NP  Patient coming from: Home  Chief Complaint:  Chief Complaint  Patient presents with   Shortness of Breath   HPI: Kim [name] is a 66 y.o. 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 Prior to her hospital discharge on on 09/18/21, she was hospitalized from 05/22 -05/25 after she had a syncopal event with minimal subarachnoid hemorrhage.  She had a Zio patch placed during the hospitalization for evaluation of worsening dizziness and headaches. According to the patient and her husband who provide the history she was discharged home in her usual state of health yesterday and when they arrived home she weighed herself and her weight was about 147 pounds.  Her baseline weight is usually between 138 and 140 pounds and so patient took Lasix 40 mg p.o. as well as potassium supplements as she was advised to do with increased weight gain over 2 to 3 pounds. She went to bed and did not use her BiPAP because she was getting up to use the bathroom frequently since she had taken Lasix and woke up early this morning with difficulty breathing.  She used her albuterol inhaler and also to nebulizer treatments without any significant improvement.  She notes that her blood pressure was extremely elevated and she panicked and also had an anxiety attack.  She took 2 Xanax pills without any significant improvement.  She then went back on her BiPAP and felt a little better but her blood pressure remained elevated so she called EMS. When EMS arrived patient was said to have wheezes and received  2 more nebulizer treatments. During my evaluation she appears comfortable on 2 L of oxygen.  She has a cough productive of occasional greenish phlegm but  denies having any chest pain, no nausea, no vomiting, no headache, no fever, no chills, no changes in her bowel habits, no urinary symptoms, no leg swelling or focal deficit. In the ER she received Lasix 40 mg IV and Solu-Medrol 125 mg IV x1.  Review of Systems: As mentioned in the history of present illness. All other systems reviewed and are negative. 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   OSTEOARTHRITIS 06/05/2009   PARESTHESIA 08/24/2007   TOBACCO ABUSE 01/25/2010   Past Surgical History:  Procedure Laterality Date   ABDOMINAL HYSTERECTOMY     APPENDECTOMY  1969   CARDIAC CATHETERIZATION N/A 07/05/2015   Procedure: Left Heart Cath and Coronary Angiography;  Surgeon: Leonie Man, MD;  Location: [facility];  Service: Cardiovascular;  Laterality: N/A;   CHOLECYSTECTOMY  1989   collapse lung  1990   COLONOSCOPY WITH PROPOFOL N/A 05/14/2018   Procedure: COLONOSCOPY WITH PROPOFOL;  Surgeon: Irene Shipper, MD;  Location: [facility];  Service: Endoscopy;  Laterality: N/A;   EXCISION MASS ABDOMINAL  2019   EXPLORATORY LAPAROTOMY  1969   LEFT HEART CATH AND CORONARY ANGIOGRAPHY N/A 11/19/2020   Procedure: LEFT HEART CATH AND CORONARY ANGIOGRAPHY;  Surgeon:  End, Harrell Gave, MD;  Location: [facility];  Service: Cardiovascular;  Laterality: N/A;   POLYPECTOMY  05/14/2018   Procedure: POLYPECTOMY;  Surgeon: Irene Shipper, MD;  Location: [facility];  Service: Endoscopy;;   SALPINGOOPHORECTOMY  2019   Social History:  reports that she quit smoking about 7 years ago. Her smoking use included cigarettes. She has a 76.00 pack-year smoking history. She has never used smokeless  tobacco. She reports that she does not drink alcohol and does not use drugs.  Allergies  Allergen Reactions   Tape Other (See Comments)    Skin tears easily, peels skin off   Daliresp [Roflumilast]     Skin peeling   Latex Other (See Comments)    Peels skin off   Lisinopril Cough   Morphine And Related     hallucinations   Penicillins Hives and Other (See Comments)    Tolerates Omnicef Has patient had a PCN reaction causing immediate rash, facial/tongue/throat swelling, SOB or lightheadedness with hypotension: Yes Has patient had a PCN reaction causing severe rash involving mucus membranes or skin necrosis: No Has patient had a PCN reaction that required hospitalization No Has patient had a PCN reaction occurring within the last 10 years: No If all of the above answers are "NO", then may proceed with Cephalosporin use.    Omnicef [Cefdinir] Rash    Family History  Problem Relation Age of Onset   Cancer Mother        lung   Heart attack Mother    Hypertension Sister    Multiple sclerosis Brother    Diabetes Sister    Rheum arthritis Sister    Thyroid disease Sister    Multiple sclerosis Other    Alcohol abuse Other    Arthritis Other    Diabetes Other    Kidney disease Other    Cancer Other        lung,ovarian,skin, uterine   Stroke Other    Heart disease Other    Melanoma Other    Osteoporosis Other    Heart attack Maternal Uncle    Heart attack Other        NEICE   Heart attack Other        NEPHEW   Hypertension Brother    Stroke Maternal Grandmother    Colon cancer Neg Hx     Prior to Admission medications   Medication Sig Start Date End Date Taking? Authorizing Provider  acetaminophen (TYLENOL) 500 MG tablet Take 500 mg by mouth 2 (two) times daily as needed for headache.   Yes [provider]  albuterol (VENTOLIN HFA) 108 (90 Base) MCG/ACT inhaler INHALE TWO PUFFS INTO LUNGS EVERY 4 TO 6 HOURS AS NEEDED FOR SHORTNESS OF BREATH/WHEEZING. Patient  taking differently: Inhale 2 puffs into the lungs every 4 (four) hours as needed for wheezing or shortness of breath. 07/26/21  Yes Martyn Ehrich, NP  ALPRAZolam Duanne Moron) 0.5 MG tablet Take 1 tablet (0.5 mg total) by mouth 3 (three) times daily as needed for anxiety. 09/05/21 10/05/21 Yes Nafziger, Tommi Rumps, NP  aspirin EC 81 MG EC tablet Take 1 tablet (81 mg total) by mouth daily. Patient taking differently: Take 81 mg by mouth 3 days. 07/08/15  Yes Nita Sells, MD  butalbital-acetaminophen-caffeine (FIORICET) 50-325-40 MG tablet Take 1 tablet by mouth every 6 (six) hours as needed for headache. 09/05/21 10/05/21 Yes Nafziger, Tommi Rumps, NP  Calcium Carb-Cholecalciferol (CALCIUM 600 + D PO) Take 600 mg by mouth in the  morning and at bedtime. Calcium 600 mg + 4000 unit D3   Yes [provider]  cetirizine (ZYRTEC) 10 MG tablet Take 10 mg by mouth daily.   Yes [provider]  Cholecalciferol (VITAMIN D3 PO) Take 5,000 Units by mouth daily.   Yes [provider]  furosemide (LASIX) 40 MG tablet Take 1 tablet (40 mg total) by mouth daily as needed (for an overnight weight gain of 3-5 pounds of fluid). 08/16/21  Yes [name], MD  Ginkgo Biloba Extract 60 MG CAPS Take 1 capsule by mouth daily. 08/29/21  Yes [provider]  HYDROcodone-acetaminophen (NORCO) 7.5-325 MG tablet Take 1 tablet by mouth every 6 (six) hours as needed for moderate pain. 08/29/21  Yes Ghimire, Henreitta Leber, MD  ipratropium-albuterol (DUONEB) 0.5-2.5 (3) MG/3ML SOLN USE 3 ML VIA NEBULIZER EVERY 6 HOURS AS NEEDED.  DX: J44.9 Patient taking differently: Take 3 mLs by nebulization. 3 ml nebulization five times a day as needed for shortness of breath 07/09/21  Yes Margaretha Seeds, MD  lidocaine (LIDODERM) 5 % Place 1 patch onto the skin daily. Remove & Discard patch within 12 hours or as directed by MD Patient taking differently: Place 1 patch onto the skin every other day. Remove & Discard patch  within 12 hours or as directed by MD 08/29/21  Yes Ghimire, Henreitta Leber, MD  losartan (COZAAR) 25 MG tablet Take 1 tablet (25 mg total) by mouth daily. 04/26/21  Yes Weaver, Scott T, PA-C  magnesium oxide (MAG-OX) 400 MG tablet Take 400 mg by mouth daily.   Yes [provider]  MUCINEX MAXIMUM STRENGTH 1200 MG TB12 Take 1,200 mg by mouth every 12 (twelve) hours.   Yes [provider]  nitroGLYCERIN (NITROSTAT) 0.4 MG SL tablet Place 1 tablet (0.4 mg total) under the tongue every 5 (five) minutes as needed for chest pain. 10/24/20  Yes [name], MD  nystatin (MYCOSTATIN) 100000 UNIT/ML suspension Take 5 mLs by mouth 3 (three) times daily as needed (thrush). 08/05/21  Yes [provider]  Olopatadine HCl (PATADAY OP) Place 1 drop into the left eye daily.   Yes [provider]  ondansetron (ZOFRAN) 4 MG tablet Take 1 tablet (4 mg total) by mouth every 8 (eight) hours as needed for nausea or vomiting. 09/05/21  Yes Nafziger, Tommi Rumps, NP  OXYGEN Inhale 2 L/min into the lungs continuous.   Yes [provider]  PARoxetine (PAXIL) 30 MG tablet Take 1 tablet (30 mg total) by mouth daily. 06/11/21  Yes Nafziger, Tommi Rumps, NP  potassium chloride (KLOR-CON) 10 MEQ tablet Take 1 tablet (10 mEq total) by mouth as needed (take with furosemide (lasix)). 09/12/21  Yes Swinyer, Lanice Schwab, NP  predniSONE (DELTASONE) 10 MG tablet Take 1 tablet (10 mg total) by mouth daily. 09/21/21  Yes Little Ishikawa, MD  SYMBICORT 160-4.5 MCG/ACT inhaler INHALE 2 PUFFS TWICE DAILY Patient taking differently: 2 puffs 2 (two) times daily. 12/31/20  Yes Martyn Ehrich, NP  Tiotropium Bromide Monohydrate (SPIRIVA RESPIMAT) 2.5 MCG/ACT AERS Inhale 2 puffs into the lungs daily. Patient taking differently: Inhale 2 puffs into the lungs daily as needed (wheezing). 12/06/20  Yes Martyn Ehrich, NP  bisoprolol (ZEBETA) 5 MG tablet Take 0.5 tablets (2.5 mg total) by mouth daily. 09/19/21    Little Ishikawa, MD  predniSONE (DELTASONE) 20 MG tablet Take 1 tablet (20 mg total) by mouth daily with breakfast for 2 days. 09/19/21 09/21/21  Holli Humbles  C, MD  Spacer/Aero Chamber Mouthpiece MISC 1 Device by Does not apply route as directed. 01/12/17   Javier Glazier, MD    Physical Exam: Vitals:   09/19/21 1215 09/19/21 1230 09/19/21 1245 09/19/21 1300  BP: 117/60 106/63 (!) 110/59 (!) 115/56  Pulse: 79 80 81 80  Resp: 20 (!) $Remo'21 20 20  'LosPz$ Temp:      TempSrc:      SpO2: 100% 100% 100% 100%  Weight:      Height:       Physical Exam Vitals and nursing note reviewed.  Constitutional:      Appearance: She is well-developed.  HENT:     Head: Normocephalic and atraumatic.     Mouth/Throat:     Mouth: Mucous membranes are moist.  Eyes:     Pupils: Pupils are equal, round, and reactive to light.  Cardiovascular:     Rate and Rhythm: Normal rate and regular rhythm.  Pulmonary:     Effort: Pulmonary effort is normal.     Breath sounds: Examination of the right-upper field reveals wheezing. Examination of the left-upper field reveals wheezing. Examination of the right-middle field reveals wheezing. Examination of the left-middle field reveals wheezing. Examination of the right-lower field reveals wheezing. Examination of the left-lower field reveals wheezing. Wheezing present.  Abdominal:     General: Bowel sounds are normal.     Palpations: Abdomen is soft.     Tenderness: There is abdominal tenderness.     Comments: Tenderness left lower quadrant  Musculoskeletal:        General: Normal range of motion.     Cervical back: Normal range of motion and neck supple.  Skin:    General: Skin is warm and dry.  Neurological:     General: No focal deficit present.     Mental Status: She is alert.  Psychiatric:        Mood and Affect: Mood normal.        Behavior: Behavior normal.     Data Reviewed: Relevant notes from primary care and specialist visits, past discharge  summaries as available in EHR, including Care Everywhere. Prior diagnostic testing as pertinent to current admission diagnoses Updated medications and problem lists for reconciliation ED course, including vitals, labs, imaging, treatment and response to treatment Triage notes, nursing and pharmacy notes and ED provider's notes Notable results as noted in HPI Labs reviewed.  BNP 357, sodium 144, potassium 3.8, chloride 110, bicarb 25, glucose 86, BUN 27, creatinine 1.09, calcium 8.7, total protein 5.7, albumin 3.5, AST 20, ALT 17, alk phos 70, troponin 181, white count 16.5, hemoglobin 11.0, hematocrit 35.9, platelet count 278, ABG 7.3 5/48/82/96 Chest x-ray reviewed by me shows no evidence of acute cardiopulmonary disease.  Emphysema. Twelve-lead EKG reviewed by me shows sinus rhythm with nonspecific T wave inversions in the anterolateral leads There are no new results to review at this time.  Assessment and Plan: COPD with acute exacerbation ([place]) Patient with a history of severe COPD with chronic respiratory failure on 2 L of oxygen who presents for evaluation of sudden onset shortness of breath associated with wheezing and a cough. Continue scheduled and as needed bronchodilator therapy Continue inhaled and systemic steroids Continue oxygen supplementation to maintain pulse oximetry greater than 92%  Syncope and collapse Zio patch placed after patient's syncopal episode of subarachnoid bleed Follow-up with cardiology as recommended  Chronic respiratory failure with hypoxia and hypercapnia (HCC) Stable Continue oxygen supplementation during the day and BiPAP  at night  Chronic combined systolic (congestive) and diastolic (congestive) heart failure (HCC) Stable and not acutely exacerbated Last 2D echocardiogram from 05/23 showed an LVEF of 35 to 40% with grade 1 diastolic dysfunction Continue bisoprolol and losartan Continue Lasix 40 mg as needed for 3 to 5 pounds weight gain over 24  hours  Essential hypertension Continue bisoprolol and losartan  Anxiety and depression Stable Continue Xanax and Paxil      Advance Care Planning:   Code Status: DNR   Consults: Cardiology  Family Communication: Greater than 50% of time was spent discussing patient's condition and plan of care with her and her husband at the bedside.  All questions and concerns have been addressed.  They verbalized understanding and agree with the plan.  Severity of Illness: The appropriate patient status for this patient is INPATIENT. Inpatient status is judged to be reasonable and necessary in order to provide the required intensity of service to ensure the patient's safety. The patient's presenting symptoms, physical exam findings, and initial radiographic and laboratory data in the context of their chronic comorbidities is felt to place them at high risk for further clinical deterioration. Furthermore, it is not anticipated that the patient will be medically stable for discharge from the hospital within 2 midnights of admission.   * I certify that at the point of admission it is my clinical judgment that the patient will require inpatient hospital care spanning beyond 2 midnights from the point of admission due to high intensity of service, high risk for further deterioration and high frequency of surveillance required.*  Author: Collier Bullock, MD 09/19/2021 3:38 PM  For on call review [email or web address].

## 2021-09-19 NOTE — Assessment & Plan Note (Addendum)
Acute on chronic hypoxemic and hypercapnic respiratory failure.  Slowly improving ventilation.  02 saturation is 99% on 2 L/min per [place].   Continue with aggressive bronchodilator therapy, with albureral and imprtropium. Continue with dulera and add bedside albuterol MDI. Incentive spirometer and flutter valve. Antitussive agents.   Plan to decrease methylprednisolone to 40 mg daily.   Out of bed to chair tid with meals.  PT and OT for mobility.

## 2021-09-19 NOTE — ED Triage Notes (Signed)
Pt back to the ED today just dischrged yesterday with c/o sob , hx of copd, smoked for years and woke this morning got dizzy , pt received 2 duo nebs from ems , 2 liters otc O2

## 2021-09-19 NOTE — Assessment & Plan Note (Addendum)
-  Stable overall -Continue Xanax and Paxil

## 2021-09-20 ENCOUNTER — Inpatient Hospital Stay (HOSPITAL_COMMUNITY): Payer: Medicare Other

## 2021-09-20 ENCOUNTER — Other Ambulatory Visit (HOSPITAL_COMMUNITY): Payer: Self-pay

## 2021-09-20 ENCOUNTER — Telehealth: Payer: Self-pay | Admitting: Pharmacist

## 2021-09-20 DIAGNOSIS — R0609 Other forms of dyspnea: Secondary | ICD-10-CM

## 2021-09-20 DIAGNOSIS — F419 Anxiety disorder, unspecified: Secondary | ICD-10-CM

## 2021-09-20 DIAGNOSIS — I10 Essential (primary) hypertension: Secondary | ICD-10-CM | POA: Diagnosis not present

## 2021-09-20 DIAGNOSIS — J441 Chronic obstructive pulmonary disease with (acute) exacerbation: Secondary | ICD-10-CM | POA: Diagnosis not present

## 2021-09-20 DIAGNOSIS — I5042 Chronic combined systolic (congestive) and diastolic (congestive) heart failure: Secondary | ICD-10-CM | POA: Diagnosis not present

## 2021-09-20 DIAGNOSIS — R55 Syncope and collapse: Secondary | ICD-10-CM | POA: Diagnosis not present

## 2021-09-20 DIAGNOSIS — F32A Depression, unspecified: Secondary | ICD-10-CM

## 2021-09-20 DIAGNOSIS — J9621 Acute and chronic respiratory failure with hypoxia: Secondary | ICD-10-CM | POA: Diagnosis not present

## 2021-09-20 LAB — CBC
HCT: 34.1 % — ABNORMAL LOW (ref 36.0–46.0)
Hemoglobin: 11 g/dL — ABNORMAL LOW (ref 12.0–15.0)
MCH: 31.2 pg (ref 26.0–34.0)
MCHC: 32.3 g/dL (ref 30.0–36.0)
MCV: 96.6 fL (ref 80.0–100.0)
Platelets: 298 10*3/uL (ref 150–400)
RBC: 3.53 MIL/uL — ABNORMAL LOW (ref 3.87–5.11)
RDW: 15.4 % (ref 11.5–15.5)
WBC: 13.3 10*3/uL — ABNORMAL HIGH (ref 4.0–10.5)
nRBC: 0 % (ref 0.0–0.2)

## 2021-09-20 LAB — BASIC METABOLIC PANEL
Anion gap: 14 (ref 5–15)
BUN: 28 mg/dL — ABNORMAL HIGH (ref 8–23)
CO2: 25 mmol/L (ref 22–32)
Calcium: 8.8 mg/dL — ABNORMAL LOW (ref 8.9–10.3)
Chloride: 98 mmol/L (ref 98–111)
Creatinine, Ser: 1.02 mg/dL — ABNORMAL HIGH (ref 0.44–1.00)
GFR, Estimated: >60 mL/min (ref >60)
Glucose, Bld: 205 mg/dL — ABNORMAL HIGH (ref 70–99)
Potassium: 4.3 mmol/L (ref 3.5–5.1)
Sodium: 137 mmol/L (ref 135–145)

## 2021-09-20 LAB — ECHOCARDIOGRAM LIMITED
Area-P 1/2: 3.99 cm2
Height: 61 in
P 1/2 time: 380 msec
S' Lateral: 3.3 cm
Weight: 2275.2 oz

## 2021-09-20 MED ORDER — BUDESONIDE 0.5 MG/2ML IN SUSP
0.5000 mg | Freq: Two times a day (BID) | RESPIRATORY_TRACT | Status: DC
  Administered 2021-09-20 – 2021-09-22 (×5): 0.5 mg via RESPIRATORY_TRACT
  Filled 2021-09-20 (×5): qty 2

## 2021-09-20 MED ORDER — ARFORMOTEROL TARTRATE 15 MCG/2ML IN NEBU
15.0000 ug | INHALATION_SOLUTION | Freq: Two times a day (BID) | RESPIRATORY_TRACT | Status: DC
  Administered 2021-09-20 – 2021-09-22 (×5): 15 ug via RESPIRATORY_TRACT
  Filled 2021-09-20 (×5): qty 2

## 2021-09-20 MED ORDER — IPRATROPIUM-ALBUTEROL 0.5-2.5 (3) MG/3ML IN SOLN
3.0000 mL | Freq: Two times a day (BID) | RESPIRATORY_TRACT | Status: DC
  Administered 2021-09-21: 3 mL via RESPIRATORY_TRACT
  Filled 2021-09-20 (×2): qty 3

## 2021-09-20 MED ORDER — ASPIRIN 81 MG PO TBEC
81.0000 mg | DELAYED_RELEASE_TABLET | Freq: Every day | ORAL | Status: DC
  Administered 2021-09-21 – 2021-09-24 (×4): 81 mg via ORAL
  Filled 2021-09-20 (×4): qty 1

## 2021-09-20 MED ORDER — IPRATROPIUM-ALBUTEROL 0.5-2.5 (3) MG/3ML IN SOLN
3.0000 mL | RESPIRATORY_TRACT | Status: DC | PRN
Start: 2021-09-20 — End: 2021-09-21
  Administered 2021-09-20: 3 mL via RESPIRATORY_TRACT

## 2021-09-20 NOTE — TOC Initial Note (Addendum)
Transition of Care [facility]) - Initial/Assessment Note    Patient Details  Name: Kim [name] MRN: [id number] Date of Birth: 06/01/1955  [facility]) CM/SW Contact:    Zenon Mayo, RN Phone Number: 09/20/2021, 3:34 PM  Clinical Narrative:                 NCM spoke with patient at the bedside, she lives with her spouse, she has PCP  with [facility] at [place]. Pharmacy is [facility] on [address] and [address]. She eats a no salt diet, weighs herself daily, and checks her bp dailyh, she has a Bipap at home that she wears at night, she has home oxygen with [employer] 2 liters, has a rollator.  She states she would like to have a BSC ( will use as a shower chair as well).  NCM made referral to [facility] with [employer].  She has apt with [facility] outpatient for vestibular therapy on 6/21.  She is ok with [employer] supplying the [facility].  Her spouse will transport her home at discharge.  TOC will continue to follow for dc needs.   Expected Discharge Plan: Home/Self Care Barriers to Discharge: Continued Medical Work up   Patient Goals and CMS Choice Patient states their goals for this hospitalization and ongoing recovery are:: return home      Expected Discharge Plan and Services Expected Discharge Plan: Home/Self Care   Discharge Planning Services: CM Consult Post Acute Care Choice: NA Living arrangements for the past 2 months: Single Family Home                   DME Agency: NA       HH Arranged: NA          Prior Living Arrangements/Services Living arrangements for the past 2 months: Single Family Home Lives with:: Spouse Patient language and need for interpreter reviewed:: Yes Do you feel safe going back to the place where you live?: Yes      Need for Family Participation in Patient Care: Yes (Comment) Care giver support system in place?: Yes (comment) Current home services: DME (Bipap, home oxygen with [employer], rollator) Criminal Activity/Legal Involvement Pertinent  to Current Situation/Hospitalization: No - Comment as needed  Activities of Daily Living Home Assistive Devices/Equipment: Oxygen ADL Screening (condition at time of admission) Patient's cognitive ability adequate to safely complete daily activities?: Yes Is the patient deaf or have difficulty hearing?: No Does the patient have difficulty seeing, even when wearing glasses/contacts?: No Does the patient have difficulty concentrating, remembering, or making decisions?: No Patient able to express need for assistance with ADLs?: Yes Does the patient have difficulty dressing or bathing?: Yes Independently performs ADLs?: No Communication: Independent Dressing (OT): Needs assistance Is this a change from baseline?: Pre-admission baseline Grooming: Independent Feeding: Independent Bathing: Needs assistance Is this a change from baseline?: Pre-admission baseline Toileting: Independent In/Out Bed: Independent Walks in Home: Needs assistance Is this a change from baseline?: Pre-admission baseline Does the patient have difficulty walking or climbing stairs?: Yes Weakness of Legs: Both Weakness of Arms/Hands: None  Permission Sought/Granted                  Emotional Assessment Appearance:: Appears stated age Attitude/Demeanor/Rapport: Engaged Affect (typically observed): Appropriate Orientation: : Oriented to Self, Oriented to Place, Oriented to  Time, Oriented to Situation Alcohol / Substance Use: Not Applicable Psych Involvement: No (comment)  Admission diagnosis:  SOB (shortness of breath) [R06.02] Other hypervolemia [E87.79] Dyspnea, unspecified  type [R06.00] Patient Active Problem List   Diagnosis Date Noted   SOB (shortness of breath) 09/19/2021   Acute respiratory failure with hypoxia and hypercapnia (HCC)    DNR (do not resuscitate) 09/17/2021   COPD with acute exacerbation ([place]) 08/26/2021   Double vision 08/26/2021   AKI (acute kidney injury) ([place]) 08/26/2021    Postconcussive syndrome 08/26/2021   Elevated troponin 08/26/2021   Subarachnoid hemorrhage ([place]) 08/26/2021   Syncope and collapse 08/26/2021   Heart failure with preserved ejection fraction ([place]) 08/26/2021   Chronic respiratory failure with hypoxia and hypercapnia ([place]) 06/26/2021   NSTEMI (non-ST elevated myocardial infarction) ([place]) 11/17/2020   Osteoporosis 06/05/2020   Chronic combined systolic (congestive) and diastolic (congestive) heart failure ([place]) 03/24/2020   Essential hypertension 03/24/2020   Vertebral fracture, osteoporotic ([place]) 03/05/2020   History of colonic polyps    Benign neoplasm of ascending colon    Neuritis 01/29/2017   Alpha-1-antitrypsin deficiency carrier 01/19/2017   Dependent edema 01/14/2017   Orthostatic hypotension    Allergic rhinitis 08/10/2015   Acute on chronic respiratory failure with hypoxia ([place]) 07/05/2015   Hyperglycemia    HLD (hyperlipidemia)    Coronary artery calcification seen on CAT scan 07/02/2015   Solitary pulmonary nodule 12/02/2013   Loss of weight 11/10/2013   Osteoarthritis 06/05/2009   Brachial neuritis or radiculitis 06/05/2009   Dyslipidemia 03/12/2009   NEPHROLITHIASIS 01/05/2008   COPD GOLD III  08/31/2007   PARESTHESIA 08/24/2007   Anxiety and depression 11/19/2006   PCP:  [name], NP Pharmacy:   [facility], [place] [address] Phone: [phone] Fax: [facility], [place] - [address] AT [facility] [address] & [address] [place] [address] Phone: [phone] Fax: [phone]  [facility] - [place], [place] [address] Phone: [phone] Fax: [phone]  [facility] [place], [address] AT [place] [address] Phone: [phone] Fax: [phone]     Social Determinants of Health (SDOH) Interventions    Readmission Risk Interventions    09/20/2021    3:27 PM  Readmission Risk Prevention Plan  Transportation Screening Complete  Medication Review ([place]) Complete  PCP or Specialist appointment within 3-5 days of discharge Complete  HRI or [place] Complete  SW Recovery Care/Counseling Consult Complete  [place] Not Applicable

## 2021-09-20 NOTE — Progress Notes (Signed)
Progress Note   Patient: Kim [name] OEU:[id number] DOB: Jan 10, 1956 DOA: 09/19/2021     1 DOS: the patient was seen and examined on 09/20/2021   Brief hospital course: As per H&P written by Dr. Francine Graven on 09/19/21 Kim [name] is a 66 y.o. 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 Prior to her hospital discharge on on 09/18/21, she was hospitalized from 05/22 -05/25 after she had a syncopal event with minimal subarachnoid hemorrhage.  She had a Zio patch placed during the hospitalization for evaluation of worsening dizziness and headaches. According to the patient and her husband who provide the history she was discharged home in her usual state of health yesterday and when they arrived home she weighed herself and her weight was about 147 pounds.  Her baseline weight is usually between 138 and 140 pounds and so patient took Lasix 40 mg p.o. as well as potassium supplements as she was advised to do with increased weight gain over 2 to 3 pounds. She went to bed and did not use her BiPAP because she was getting up to use the bathroom frequently since she had taken Lasix and woke up early this morning with difficulty breathing.  She used her albuterol inhaler and also to nebulizer treatments without any significant improvement.  She notes that her blood pressure was extremely elevated and she panicked and also had an anxiety attack.  She took 2 Xanax pills without any significant improvement.  She then went back on her BiPAP and felt a little better but her blood pressure remained elevated so she called EMS. When EMS arrived patient was said to have wheezes and received 2 more nebulizer treatments. During my evaluation she appears comfortable  on 2 L of oxygen.  She has a cough productive of occasional greenish phlegm but  denies having any chest pain, no nausea, no vomiting, no headache, no fever, no chills, no changes in her bowel habits, no urinary symptoms, no leg swelling or focal deficit. In the ER she received Lasix 40 mg IV and Solu-Medrol 125 mg IV x1.  Assessment and Plan: COPD with acute exacerbation ([place]) -Patient with a history of severe COPD with chronic respiratory failure on 2 L of oxygen at baseline. -Mild difficulty speaking in full sentences, short winded with activity.  -Continue current steroid dosage; start patient on Pulmicort and Brovana -Continue DuoNebs, mucolytic's and flutter valve.  -Follow clinical response.  Syncope and collapse -Zio patch placed after patient's syncopal episode with  subarachnoid bleed. -Follow cardiology recommendations -low dose aspirin started  -follow carotid dopplers results.  Chronic respiratory failure with hypoxia and hypercapnia (HCC) -Stable -Continue oxygen supplementation during the day and the use of BiPAP at night. -Patient oriented x4 on examination.  Chronic combined systolic (congestive) and diastolic (congestive) heart failure (HCC) -With concerns of mild exacerbation in the setting of steroids usage for recent COPD exacerbation and salt indiscretion. -BNP was elevated at time of admission, fine crackles heard on exam. -continue IV diuresis -Follow repeat echo  -Follow cardiology service recommendation for GDMT  -Low-sodium diet, daily weights and strict I's and O's.     Essential hypertension -Continue bisoprolol at adjusted dose by cardiology -continue losartan -follow VS  Anxiety and depression -Stable overall -Continue Xanax  and Paxil    Subjective:  No chest pain, no nausea, no vomiting; reports breathing is better today.  Still short winded with activity and expressing mild orthopnea.  Mild difficulty speaking in full sentences.  Physical  Exam: Vitals:   09/20/21 0804 09/20/21 0858 09/20/21 1225 09/20/21 1531  BP: (!) 101/50  115/61 120/63  Pulse: 87  74 77  Resp: (!) '21  19 17  '$ Temp:    97.6 F (36.4 C)  TempSrc:    Oral  SpO2: 96% 100% 98% 97%  Weight:      Height:       General exam: Alert, awake, oriented x 3; no chest pain, no nausea, no vomiting.  Reports mild difficulty speaking in full sentences and is still feeling short winded with activity.  Good oxygen saturation on chronic supplementation at rest. Respiratory system: Positive rhonchi, mild expiratory wheezing; not using accessory muscles. Cardiovascular system:Rate controlled, no rubs, no gallops. Gastrointestinal system: Abdomen is nondistended, soft and nontender. No organomegaly or masses felt. Normal bowel sounds heard. Central nervous system: Alert and oriented. No focal neurological deficits. Extremities: No cyanosis or clubbing. Skin: No petechiae. Psychiatry: Judgement and insight appear normal. Mood & affect appropriate.   Data Reviewed: CBC: WBC 13.3, hemoglobin 11.0, platelet count 024 K Basic metabolic panel: Sodium 097, potassium 4.3, chloride 98, BUN 28, creatinine 1.02.  Family Communication: No family at bedside.  Disposition: Status is: Inpatient Remains inpatient appropriate because: Still having shortness of breath, expiratory wheezing and decreased breath sounds at the bases with short winded sensation with activity.  Continue IV diuresis and IV steroids.  Planned Discharge Destination: Home   Author: Barton Dubois, MD 09/20/2021 3:39 PM  For on call review [email or web address].

## 2021-09-20 NOTE — Telephone Encounter (Signed)
Will start Dupixent BIV in separate telephone encounter  Steele Ledonne, PharmD, MPH, BCPS, CPP Clinical Pharmacist (Rheumatology and Pulmonology) 

## 2021-09-20 NOTE — Telephone Encounter (Signed)
Submitted a Prior Authorization request to [facility] for [place] via CoverMyMeds. Will update once we receive a response.  Key: [facility]  Patient currently readmitted so will need Dupixent [place] patient assistnace paperwork completed once discharged  Knox Saliva, PharmD, MPH, BCPS, CPP Clinical Pharmacist (Rheumatology and Pulmonology)

## 2021-09-20 NOTE — Progress Notes (Signed)
Bilateral carotid duplex study completed. Please see CV Proc for preliminary results.  Owais Pruett BS, RVT 09/20/2021 11:14 AM

## 2021-09-20 NOTE — Telephone Encounter (Addendum)
Received notification from [facility] regarding a prior authorization for [place]. Authorization has been APPROVED from 09/20/21 to 04/06/22.   Per test claim, copay for 28 days supply is $1919.53  Patient can fill through [place]: [phone]   Authorization # [id number]  Patient will need to complete [facility] form for patient assistance pending d/c from hospital. Printed in preparation  Knox Saliva, PharmD, MPH, BCPS, CPP Clinical Pharmacist (Rheumatology and Pulmonology)

## 2021-09-20 NOTE — Progress Notes (Signed)
  Echocardiogram 2D Echocardiogram has been performed.  Kim [name] 09/20/2021, 10:01 AM

## 2021-09-20 NOTE — Progress Notes (Signed)
Progress Note  Patient Name: Kim [name] Date of Encounter: 09/20/2021  Primary Cardiologist: Lauree Chandler, MD   Subjective   Overnight BP lower than prior. Patient notes that she is feeling better. No CP, SOB, Palpitations.  Inpatient Medications    Scheduled Meds:  arformoterol  15 mcg Nebulization BID   aspirin EC  81 mg Oral Daily   bisoprolol  5 mg Oral Daily   budesonide (PULMICORT) nebulizer solution  0.5 mg Nebulization BID   calcium carbonate  1 tablet Oral Q breakfast   cholecalciferol  5,000 Units Oral Daily   furosemide  40 mg Intravenous Daily   guaiFENesin  1,200 mg Oral Q12H   losartan  25 mg Oral Daily   magnesium oxide  400 mg Oral Daily   methylPREDNISolone (SOLU-MEDROL) injection  40 mg Intravenous Q12H   Followed by   Derrill Memo ON 09/21/2021] predniSONE  40 mg Oral Q breakfast   olopatadine  1 drop Left Eye Daily   PARoxetine  30 mg Oral Daily   Continuous Infusions:  lactated ringers Stopped (09/19/21 1446)   PRN Meds: acetaminophen, ALPRAZolam, butalbital-acetaminophen-caffeine, ipratropium-albuterol, nitroGLYCERIN, ondansetron **OR** ondansetron (ZOFRAN) IV   Vital Signs    Vitals:   09/19/21 2221 09/19/21 2308 09/20/21 0400 09/20/21 0804  BP: 134/75  (!) 106/54 (!) 101/50  Pulse: 85 83 77 87  Resp: '19 17 17 '$ (!) 21  Temp: 97.6 F (36.4 C)  97.6 F (36.4 C)   TempSrc: Oral  Oral   SpO2: 98% 97% 96% 96%  Weight: 64 kg  64.5 kg   Height: '5\' 1"'$  (1.549 m)       Intake/Output Summary (Last 24 hours) at 09/20/2021 0810 Last data filed at 09/20/2021 0804 Gross per 24 hour  Intake 120 ml  Output 1500 ml  Net -1380 ml   Filed Weights   09/19/21 1050 09/19/21 2221 09/20/21 0400  Weight: 65.3 kg 64 kg 64.5 kg    Telemetry    SR - Personally Reviewed  ECG    SR rate 96 abnormal TWI - Personally Reviewed  Physical Exam   Gen: No distress   Neck: No JVD Cardiac: No Rubs or Gallops, no Murmur, RRR +2 radial  pulses Respiratory: bilateral inspiratory wheezes normal effort, slightly increased  respiratory rate (21, exam at 833) GI: Soft, nontender, non-distended  MS: trace non pitting  edema;  moves all extremities Integument: Skin feels warm Neuro:  At time of evaluation, alert and oriented to person/place/time/situation  Psych: Normal affect, patient feels well   Labs    Chemistry Recent Labs  Lab 09/17/21 0444 09/17/21 0457 09/18/21 0109 09/19/21 1044 09/19/21 1126 09/20/21 0318  NA 140   < > 140 144 143 137  K 4.7   < > 4.4 3.8 3.8 4.3  CL 106   < > 106 110  --  98  CO2 23  --  24 25  --  25  GLUCOSE 325*   < > 142* 86  --  205*  BUN 19   < > 19 27*  --  28*  CREATININE 1.10*   < > 0.78 1.09*  --  1.02*  CALCIUM 8.9  --  8.6* 8.7*  --  8.8*  PROT 5.6*  --   --  5.7*  --   --   ALBUMIN 3.4*  --   --  3.5  --   --   AST 19  --   --  20  --   --  ALT 16  --   --  17  --   --   ALKPHOS 90  --   --  70  --   --   BILITOT 0.4  --   --  0.4  --   --   GFRNONAA 55*  --  >60 56*  --  >60  ANIONGAP 11  --  10 9  --  14   < > = values in this interval not displayed.     Hematology Recent Labs  Lab 09/18/21 0109 09/19/21 1044 09/19/21 1126 09/20/21 0318  WBC 15.0* 16.5*  --  13.3*  RBC 3.38* 3.54*  --  3.53*  HGB 10.6* 11.0* 10.9* 11.0*  HCT 33.1* 35.9* 32.0* 34.1*  MCV 97.9 101.4*  --  96.6  MCH 31.4 31.1  --  31.2  MCHC 32.0 30.6  --  32.3  RDW 15.0 15.4  --  15.4  PLT 252 278  --  298    Cardiac EnzymesNo results for input(s): "TROPONINI" in the last 168 hours. No results for input(s): "TROPIPOC" in the last 168 hours.   BNP Recent Labs  Lab 09/17/21 0444 09/19/21 1127  BNP 175.0* 357.2*     DDimer No results for input(s): "DDIMER" in the last 168 hours.   Radiology    CT CHEST ABDOMEN PELVIS WO CONTRAST  Result Date: 09/19/2021 CLINICAL DATA:  Shortness of breath and dizziness EXAM: CT CHEST, ABDOMEN AND PELVIS WITHOUT CONTRAST TECHNIQUE: Multidetector  CT imaging of the chest, abdomen and pelvis was performed following the standard protocol without IV contrast. RADIATION DOSE REDUCTION: This exam was performed according to the departmental dose-optimization program which includes automated exposure control, adjustment of the mA and/or kV according to patient size and/or use of iterative reconstruction technique. COMPARISON:  CT abdomen and pelvis dated March 30, 2018; chest CT dated Aug 28, 2021 FINDINGS: CT CHEST FINDINGS Cardiovascular: Normal heart size. Trace pericardial fluid. Moderate coronary artery calcifications of the LAD and circumflex. Moderate atherosclerotic disease of the thoracic aorta. Mediastinum/Nodes: Small hiatal hernia. Thyroid is unremarkable. No pathologically enlarged lymph nodes seen in the chest. Lungs/Pleura: Central airways are patent. Severe centrilobular emphysema. No consolidation, pleural effusion or pneumothorax. Small bilateral solid pulmonary nodules, unchanged in size when compared with prior and favored to be benign. Musculoskeletal: Unchanged mild wedge compression deformities of the thoracic spine. No aggressive appearing osseous lesions. CT ABDOMEN PELVIS FINDINGS Hepatobiliary: No focal liver abnormality is seen. Status post cholecystectomy. No biliary dilatation. Pancreas: Unremarkable. No pancreatic ductal dilatation or surrounding inflammatory changes. Spleen: Normal in size without focal abnormality. 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 No hydronephrosis or nephrolithiasis. Bladder is unremarkable. Stomach/Bowel: Stomach is within normal limits. Mild diverticulosis. No evidence of bowel wall thickening, distention, or inflammatory changes. Vascular/Lymphatic: Aortic atherosclerosis. No enlarged abdominal or pelvic lymph nodes. Reproductive: No adnexal masses. Other: No abdominal wall hernia or  abnormality. No abdominopelvic ascites. Musculoskeletal: No acute or significant osseous findings. IMPRESSION: 1. No acute findings in the chest, abdomen or pelvis, including no evidence of pneumonia. 2. Coronary artery calcifications, aortic Atherosclerosis (ICD10-I70.0) and Emphysema (ICD10-J43.9). Electronically Signed   By: Yetta Glassman M.D.   On: 09/19/2021 15:19   DG Chest Port 1 View  Result Date: 09/19/2021 CLINICAL DATA:  dyspnea EXAM: PORTABLE CHEST 1 VIEW COMPARISON:  June 13, 23. FINDINGS: No consolidation. No visible pleural effusions or  pneumothorax. Hyperinflation, compatible with emphysema. Redemonstrated generator type device projects over left upper chest. IMPRESSION: 1. No evidence of acute cardiopulmonary disease. 2. Emphysema. Electronically Signed   By: Margaretha Sheffield M.D.   On: 09/19/2021 11:05     Patient Profile     66 y.o. 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 Leukocytosis (potentially steroid related) Anxiety with depression  Chronic systolic and diastolic heart failure  Hx of Takotsubo cardiomyopathy, recurrent  - Echo is pending today - continuing bisoprolol - will continue her low dose ARB - do not have further BP room for GDMT - will receive 40 IV lasix dose today, pending echo will plan on PO transition vs PRN (her home regimen)   Syncope Non-obstructive CAD Recent subdural hematoma - she has restarted on aspirin 81 mg 08/29/21 with two episodes of near syncope; will continue for now and will reach out to clarify with neurosurgery - will expedite her carotid imaging to inpatient given readmission    For questions or updates, please contact [facility] Please consult [email or web address] for contact info under Cardiology/STEMI.      Rudean Haskell, MD Cardiologist Hypertrophic  [address] [place], [place] [address] [phone]  8:10 AM

## 2021-09-20 NOTE — Consult Note (Signed)
   [facility] CM Inpatient Consult   09/20/2021  Kim [name] 07-May-1955 [id number]  [facility] [ACO] Patient: Medicare ACO REACH  *Readmission review, high risk score 33%  Primary Care Provider:  [name], NP, [place] [name] is an embedded provider with a Chronic Care Management team and program, and is listed for the transition of care.  Plan: CCM team was made aware of TOC needs for post hospital needs. Continue to follow.  Please contact for further questions,  Natividad Brood, RN BSN [facility] Liaison  [phone] business mobile phone Toll free office [phone]  Fax number: [phone] [name]

## 2021-09-21 DIAGNOSIS — F419 Anxiety disorder, unspecified: Secondary | ICD-10-CM | POA: Diagnosis not present

## 2021-09-21 DIAGNOSIS — R55 Syncope and collapse: Secondary | ICD-10-CM | POA: Diagnosis not present

## 2021-09-21 DIAGNOSIS — J441 Chronic obstructive pulmonary disease with (acute) exacerbation: Secondary | ICD-10-CM | POA: Diagnosis not present

## 2021-09-21 DIAGNOSIS — I5042 Chronic combined systolic (congestive) and diastolic (congestive) heart failure: Secondary | ICD-10-CM | POA: Diagnosis not present

## 2021-09-21 DIAGNOSIS — I10 Essential (primary) hypertension: Secondary | ICD-10-CM | POA: Diagnosis not present

## 2021-09-21 MED ORDER — FUROSEMIDE 40 MG PO TABS
40.0000 mg | ORAL_TABLET | Freq: Every day | ORAL | Status: DC
Start: 2021-09-21 — End: 2021-09-22
  Administered 2021-09-21 – 2021-09-22 (×2): 40 mg via ORAL
  Filled 2021-09-21 (×2): qty 1

## 2021-09-21 MED ORDER — IPRATROPIUM-ALBUTEROL 0.5-2.5 (3) MG/3ML IN SOLN
3.0000 mL | Freq: Four times a day (QID) | RESPIRATORY_TRACT | Status: DC
  Administered 2021-09-21 – 2021-09-22 (×3): 3 mL via RESPIRATORY_TRACT
  Filled 2021-09-21 (×3): qty 3

## 2021-09-21 MED ORDER — IPRATROPIUM-ALBUTEROL 0.5-2.5 (3) MG/3ML IN SOLN
3.0000 mL | RESPIRATORY_TRACT | Status: DC | PRN
  Administered 2021-09-22: 3 mL via RESPIRATORY_TRACT
  Filled 2021-09-21: qty 3

## 2021-09-21 MED ORDER — METHYLPREDNISOLONE SODIUM SUCC 125 MG IJ SOLR
80.0000 mg | INTRAMUSCULAR | Status: DC
  Administered 2021-09-21 – 2021-09-22 (×2): 80 mg via INTRAVENOUS
  Filled 2021-09-21 (×2): qty 2

## 2021-09-21 NOTE — Progress Notes (Signed)
Progress Note  Patient Name: Kim [name] Date of Encounter: 09/21/2021  Primary Cardiologist: Lauree Chandler, MD   Subjective   BP stable. Patient notes that she is feeling better, and is hoping to get UOB today No CP, SOB, Palpitations.  Inpatient Medications    Scheduled Meds:  arformoterol  15 mcg Nebulization BID   aspirin EC  81 mg Oral Daily   bisoprolol  5 mg Oral Daily   budesonide (PULMICORT) nebulizer solution  0.5 mg Nebulization BID   calcium carbonate  1 tablet Oral Q breakfast   cholecalciferol  5,000 Units Oral Daily   furosemide  40 mg Oral Daily   guaiFENesin  1,200 mg Oral Q12H   ipratropium-albuterol  3 mL Nebulization BID   losartan  25 mg Oral Daily   magnesium oxide  400 mg Oral Daily   olopatadine  1 drop Left Eye Daily   PARoxetine  30 mg Oral Daily   predniSONE  40 mg Oral Q breakfast   Continuous Infusions:  lactated ringers Stopped (09/19/21 1446)   PRN Meds: acetaminophen, ALPRAZolam, butalbital-acetaminophen-caffeine, ipratropium-albuterol, nitroGLYCERIN, ondansetron **OR** ondansetron (ZOFRAN) IV   Vital Signs    Vitals:   09/20/21 2200 09/21/21 0521 09/21/21 0721 09/21/21 1000  BP: 112/60 (!) 123/59  (!) 118/59  Pulse: 62 65  74  Resp: '16 16  19  '$ Temp:  97.9 F (36.6 C)  98.2 F (36.8 C)  TempSrc:  Oral  Oral  SpO2: 100% 98% 100% 97%  Weight:  63.6 kg    Height:        Intake/Output Summary (Last 24 hours) at 09/21/2021 1033 Last data filed at 09/21/2021 6568 Gross per 24 hour  Intake 120 ml  Output 950 ml  Net -830 ml   Filed Weights   09/19/21 2221 09/20/21 0400 09/21/21 0521  Weight: 64 kg 64.5 kg 63.6 kg    Telemetry    SR - Personally Reviewed  ECG    SR - Personally Reviewed  Physical Exam   Gen: No distress   Neck: No JVD Cardiac: No Rubs or Gallops, no Murmur, RRR +2 radial pulses Respiratory: CTAB normal effort, regular RR rate; last duonebs one hour prior GI: Soft, nontender,  non-distended  MS: no edema;  moves all extremities Integument: Skin feels warm Neuro:  At time of evaluation, alert and oriented to person/place/time/situation  Psych: Normal affect, patient feels well   Labs    Chemistry Recent Labs  Lab 09/17/21 0444 09/17/21 0457 09/18/21 0109 09/19/21 1044 09/19/21 1126 09/20/21 0318  NA 140   < > 140 144 143 137  K 4.7   < > 4.4 3.8 3.8 4.3  CL 106   < > 106 110  --  98  CO2 23  --  24 25  --  25  GLUCOSE 325*   < > 142* 86  --  205*  BUN 19   < > 19 27*  --  28*  CREATININE 1.10*   < > 0.78 1.09*  --  1.02*  CALCIUM 8.9  --  8.6* 8.7*  --  8.8*  PROT 5.6*  --   --  5.7*  --   --   ALBUMIN 3.4*  --   --  3.5  --   --   AST 19  --   --  20  --   --   ALT 16  --   --  17  --   --  ALKPHOS 90  --   --  70  --   --   BILITOT 0.4  --   --  0.4  --   --   GFRNONAA 55*  --  >60 56*  --  >60  ANIONGAP 11  --  10 9  --  14   < > = values in this interval not displayed.     Hematology Recent Labs  Lab 09/18/21 0109 09/19/21 1044 09/19/21 1126 09/20/21 0318  WBC 15.0* 16.5*  --  13.3*  RBC 3.38* 3.54*  --  3.53*  HGB 10.6* 11.0* 10.9* 11.0*  HCT 33.1* 35.9* 32.0* 34.1*  MCV 97.9 101.4*  --  96.6  MCH 31.4 31.1  --  31.2  MCHC 32.0 30.6  --  32.3  RDW 15.0 15.4  --  15.4  PLT 252 278  --  298    Cardiac EnzymesNo results for input(s): "TROPONINI" in the last 168 hours. No results for input(s): "TROPIPOC" in the last 168 hours.   BNP Recent Labs  Lab 09/17/21 0444 09/19/21 1127  BNP 175.0* 357.2*     DDimer No results for input(s): "DDIMER" in the last 168 hours.   Radiology    VAS US CAROTID  Result Date: 09/20/2021 Carotid Arterial Duplex Study Patient Name:  Kim [name]  Date of Exam:   09/20/2021 Medical Rec #: [id number]        Accession #:    [id number] Date of Birth: Jul 19, 1955       Patient Gender: F Patient Age:   1 years Exam Location:  [facility] Procedure:      VAS US CAROTID Referring Phys:  [facility] --------------------------------------------------------------------------------  Indications:       Syncope. Risk Factors:      Hypertension, hyperlipidemia. Comparison Study:  No previous exam noted. Performing Technologist: Bobetta Lime BS, RVT  Examination Guidelines: A complete evaluation includes B-mode imaging, spectral Doppler, color Doppler, and power Doppler as needed of all accessible portions of each vessel. Bilateral testing is considered an integral part of a complete examination. Limited examinations for reoccurring indications may be performed as noted.  Right Carotid Findings: +----------+--------+--------+--------+------------------+--------+           PSV cm/sEDV cm/sStenosisPlaque DescriptionComments +----------+--------+--------+--------+------------------+--------+ CCA Prox  73      13                                         +----------+--------+--------+--------+------------------+--------+ CCA Distal81      15                                         +----------+--------+--------+--------+------------------+--------+ ICA Prox  81      22              heterogenous               +----------+--------+--------+--------+------------------+--------+ ICA Mid   161     44                                tortuous +----------+--------+--------+--------+------------------+--------+ ICA Distal74      25                                         +----------+--------+--------+--------+------------------+--------+  ECA       178     17              heterogenous               +----------+--------+--------+--------+------------------+--------+ +----------+--------+-------+----------------+-------------------+           PSV cm/sEDV cmsDescribe        Arm Pressure (mmHG) +----------+--------+-------+----------------+-------------------+ Subclavian213            Multiphasic, WNL                     +----------+--------+-------+----------------+-------------------+ +---------+--------+--+--------+--+---------+ VertebralPSV cm/s52EDV cm/s13Antegrade +---------+--------+--+--------+--+---------+  Left Carotid Findings: +----------+--------+--------+--------+------------------+--------+           PSV cm/sEDV cm/sStenosisPlaque DescriptionComments +----------+--------+--------+--------+------------------+--------+ CCA Prox  110     26              heterogenous               +----------+--------+--------+--------+------------------+--------+ CCA Distal98      27              heterogenous               +----------+--------+--------+--------+------------------+--------+ ICA Prox  91      22              heterogenous               +----------+--------+--------+--------+------------------+--------+ ICA Distal86      26                                         +----------+--------+--------+--------+------------------+--------+ ECA       115     15              heterogenous               +----------+--------+--------+--------+------------------+--------+ +----------+--------+--------+----------------+-------------------+           PSV cm/sEDV cm/sDescribe        Arm Pressure (mmHG) +----------+--------+--------+----------------+-------------------+ [id number]             Multiphasic, WNL                    +----------+--------+--------+----------------+-------------------+ +---------+--------+--+--------+-+---------+ VertebralPSV cm/s33EDV cm/s8Antegrade +---------+--------+--+--------+-+---------+   Summary: Right Carotid: Velocities in the right ICA are consistent with a 1-39% stenosis. Left Carotid: Velocities in the left ICA are consistent with a 1-39% stenosis. Vertebrals:  Bilateral vertebral arteries demonstrate antegrade flow. Subclavians: Normal flow hemodynamics were seen in bilateral subclavian              arteries. *See table(s) above for  measurements and observations.     Preliminary    ECHOCARDIOGRAM LIMITED  Result Date: 09/20/2021    ECHOCARDIOGRAM LIMITED REPORT   Patient Name:   PARADISE VENSEL Date of Exam: 09/20/2021 Medical Rec #:  [id number]       Height:       61.0 in Accession #:    [id number]      Weight:       142.2 lb Date of Birth:  14-Dec-1955      BSA:          1.634 m Patient Age:    8 years        BP:           101/50 mmHg Patient Gender: F  HR:           72 bpm. Exam Location:  Inpatient Procedure: Limited Echo, Cardiac Doppler and Color Doppler Indications:    dyspnea  History:        Patient has prior history of Echocardiogram examinations, most                 recent 08/26/2021. COPD, Signs/Symptoms:Syncope; Risk                 Factors:Hypertension and Dyslipidemia.  Sonographer:    Johny Chess RDCS Referring Phys: [id number] [place] A[place] Sau[place] IMPRESSIONS  1. Left ventricular ejection fraction, by estimation, is 60 to 65%. The left ventricle has normal function. The left ventricle has no regional wall motion abnormalities. Left ventricular diastolic parameters were normal.  2. Right ventricular systolic function is normal. The right ventricular size is normal.  3. The mitral valve is normal in structure. No evidence of mitral valve regurgitation. No evidence of mitral stenosis.  4. The aortic valve was not well visualized. There is mild calcification of the aortic valve. Aortic valve regurgitation is mild to moderate. Aortic valve sclerosis is present, with no evidence of aortic valve stenosis.  5. The inferior vena cava is normal in size with greater than 50% respiratory variability, suggesting right atrial pressure of 3 mmHg. FINDINGS  Left Ventricle: Left ventricular ejection fraction, by estimation, is 60 to 65%. The left ventricle has normal function. The left ventricle has no regional wall motion abnormalities. The left ventricular internal cavity size was normal in size. There is  no left  ventricular hypertrophy. Left ventricular diastolic parameters were normal. Right Ventricle: The right ventricular size is normal. No increase in right ventricular wall thickness. Right ventricular systolic function is normal. Left Atrium: Left atrial size was normal in size. Right Atrium: Right atrial size was normal in size. Pericardium: There is no evidence of pericardial effusion. Mitral Valve: The mitral valve is normal in structure. No evidence of mitral valve stenosis. Tricuspid Valve: The tricuspid valve is normal in structure. Tricuspid valve regurgitation is not demonstrated. No evidence of tricuspid stenosis. Aortic Valve: The aortic valve was not well visualized. There is mild calcification of the aortic valve. Aortic valve regurgitation is mild to moderate. Aortic regurgitation PHT measures 380 msec. Aortic valve sclerosis is present, with no evidence of aortic valve stenosis. Pulmonic Valve: The pulmonic valve was normal in structure. Pulmonic valve regurgitation is not visualized. No evidence of pulmonic stenosis. Aorta: The aortic root is normal in size and structure. Venous: The inferior vena cava is normal in size with greater than 50% respiratory variability, suggesting right atrial pressure of 3 mmHg. IAS/Shunts: No atrial level shunt detected by color flow Doppler. LEFT VENTRICLE PLAX 2D LVIDd:         4.50 cm Diastology LVIDs:         3.30 cm LV e' medial:    8.55 cm/s LV PW:         0.90 cm LV E/e' medial:  9.2 LV IVS:        0.90 cm LV e' lateral:   9.32 cm/s                        LV E/e' lateral: 8.4  IVC IVC diam: 1.50 cm AORTIC VALVE AI PHT:      380 msec MITRAL VALVE MV Area (PHT): 3.99 cm MV Decel Time: 190 msec MV E velocity: 78.60 cm/s MV A  velocity: 82.30 cm/s MV E/A ratio:  0.96 Jenkins Rouge MD Electronically signed by Jenkins Rouge MD Signature Date/Time: 09/20/2021/10:35:53 AM    Final    CT CHEST ABDOMEN PELVIS WO CONTRAST  Result Date: 09/19/2021 CLINICAL DATA:  Shortness of  breath and dizziness EXAM: CT CHEST, ABDOMEN AND PELVIS WITHOUT CONTRAST TECHNIQUE: Multidetector CT imaging of the chest, abdomen and pelvis was performed following the standard protocol without IV contrast. RADIATION DOSE REDUCTION: This exam was performed according to the departmental dose-optimization program which includes automated exposure control, adjustment of the mA and/or kV according to patient size and/or use of iterative reconstruction technique. COMPARISON:  CT abdomen and pelvis dated March 30, 2018; chest CT dated Aug 28, 2021 FINDINGS: CT CHEST FINDINGS Cardiovascular: Normal heart size. Trace pericardial fluid. Moderate coronary artery calcifications of the LAD and circumflex. Moderate atherosclerotic disease of the thoracic aorta. Mediastinum/Nodes: Small hiatal hernia. Thyroid is unremarkable. No pathologically enlarged lymph nodes seen in the chest. Lungs/Pleura: Central airways are patent. Severe centrilobular emphysema. No consolidation, pleural effusion or pneumothorax. Small bilateral solid pulmonary nodules, unchanged in size when compared with prior and favored to be benign. Musculoskeletal: Unchanged mild wedge compression deformities of the thoracic spine. No aggressive appearing osseous lesions. CT ABDOMEN PELVIS FINDINGS Hepatobiliary: No focal liver abnormality is seen. Status post cholecystectomy. No biliary dilatation. Pancreas: Unremarkable. No pancreatic ductal dilatation or surrounding inflammatory changes. Spleen: Normal in size without focal abnormality. 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 No hydronephrosis or nephrolithiasis. Bladder is unremarkable. Stomach/Bowel: Stomach is within normal limits. Mild diverticulosis. No evidence of bowel wall thickening, distention, or inflammatory changes. Vascular/Lymphatic: Aortic atherosclerosis. No enlarged  abdominal or pelvic lymph nodes. Reproductive: No adnexal masses. Other: No abdominal wall hernia or abnormality. No abdominopelvic ascites. Musculoskeletal: No acute or significant osseous findings. IMPRESSION: 1. No acute findings in the chest, abdomen or pelvis, including no evidence of pneumonia. 2. Coronary artery calcifications, aortic Atherosclerosis (ICD10-I70.0) and Emphysema (ICD10-J43.9). Electronically Signed   By: Yetta Glassman M.D.   On: 09/19/2021 15:19   DG Chest Port 1 View  Result Date: 09/19/2021 CLINICAL DATA:  dyspnea EXAM: PORTABLE CHEST 1 VIEW COMPARISON:  June 13, 23. FINDINGS: No consolidation. No visible pleural effusions or pneumothorax. Hyperinflation, compatible with emphysema. Redemonstrated generator type device projects over left upper chest. IMPRESSION: 1. No evidence of acute cardiopulmonary disease. 2. Emphysema. Electronically Signed   By: Margaretha Sheffield M.D.   On: 09/19/2021 11:05     Patient Profile     66 y.o. 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 Leukocytosis (potentially steroid related) Anxiety with depression  Chronic systolic and diastolic heart failure  Hx of Takotsubo cardiomyopathy, recurrent  - Echo shows LV recovery, IVCis collapsable - continuing bisoprolol - will continue her low dose ARB - do not have further BP room for GDMT - lasix 40 mg PO daily and 10 meq K for maintenance   Syncope Non-obstructive CAD Recent subdural hematoma - will continue ASA; if there are questions of safety we could formally consult NSY (has been on it for some time with no issues, I sent a message to her neurosurgeon, if we do not get a reply, I recommend touching base for clarification - carotid duplex likely mild bilateral CAS, but  pending official read    For questions or updates, please contact [facility] Please consult [email or web address] for contact info under Cardiology/STEMI.      Rudean Haskell, MD Cardiologist [facility] [address] [place], [place] [address] [phone]  10:33 AM

## 2021-09-21 NOTE — Hospital Course (Signed)
Kim [name] was admitted to the hospital with the working diagnosis of heart failure exacerbation.   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 Hospitalization 05/22 to 05/25 for SDH and syncope.  Recent hospitalization 06/13 to 06/14 for COPD exacerbation. At home noted weight gain about 10 lbs. Her blood pressure was elevated and called EMS. On her initial physical examination her blood pressure was 117/60, HR 79, RR 20 and 02 saturation 100%, her lungs had wheezing, heart S1 and S2 present and rhythmic, abdomen not distended, no lower extremity edema.   Na 144, K 3,8, Cl 110, bicarbonate 25, glucose 86, bun 27 cr 1,0 High sensitive troponin 181 and 169  Wbc 16.5 hgb 11,0 pl 278   Chest radiograph with hyperinflation, with left lower lobe atelectasis.   EKG 96 bpm, normal axis, normal intervals, sinus rhythm, with no significant ST segment or T wave changes.   Patient was placed on bronchodilator therapy and systemic steroids with  improvement of her symptoms.   Short course of diuretic therapy with furosemide.

## 2021-09-21 NOTE — Progress Notes (Signed)
  Progress Note   Patient: Kim [name] EXB:[id number] DOB: 1956/03/07 DOA: 09/19/2021     2 DOS: the patient was seen and examined on 09/21/2021   Brief hospital course: Kim [name] was admitted to the hospital with the working diagnosis of heart failure exacerbation.   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 Hospitalization 05/22 to 05/25 for SDH and syncope.  Recent hospitalization 06/13 to 06/14 for COPD exacerbation. At home noted weight gain about 10 lbs. Her blood pressure was elevated and called EMS. On her initial physical examination her blood pressure was 117/60, HR 79, RR 20 and 02 saturation 100%, her lungs had wheezing, heart S1 and S2 present and rhythmic, abdomen not distended, no lower extremity edema.   Na 144, K 3,8, Cl 110, bicarbonate 25, glucose 86, bun 27 cr 1,0 High sensitive troponin 181 and 169  Wbc 16.5 hgb 11,0 pl 278   Chest radiograph with hyperinflation, with left lower lobe atelectasis.   EKG 96 bpm, normal axis, normal intervals, sinus rhythm, with no significant ST segment or T wave changes.       Assessment and Plan: COPD with acute exacerbation ([place]) Acute on chronic hypoxemic and hypercapnic respiratory failure.  Patient with improvement in dyspnea, but continue with limited ventilation on physical examination.   Plan to continue aggressive bronchodilator therapy, incentive spirometer and flutter valve. Continue with systemic steroids and antitussive agents.  Inhaled corticosteroids.   Syncope and collapse -Zio patch placed after patient's syncopal episode subdural hematoma.    Chronic combined systolic (congestive) and diastolic (congestive) heart failure (HCC) Volume status has improved. Urine output over last 24 hrs is 2,100 ml.  Continue close blood pressure monitoring.  Continue with furosemide 40 po daily and ARB with losartan.     Anxiety and  depression -Stable overall -Continue Xanax and Paxil  Essential hypertension Continue with bisoprolol and losartan.         Subjective: Patient is feeling better but not yet back to her baseline.   Physical Exam: Vitals:   09/21/21 0521 09/21/21 0721 09/21/21 1000 09/21/21 1133  BP: (!) 123/59  (!) 118/59 138/66  Pulse: 65  74 63  Resp: '16  19 18  '$ Temp: 97.9 F (36.6 C)  98.2 F (36.8 C) 98.3 F (36.8 C)  TempSrc: Oral  Oral Oral  SpO2: 98% 100% 97% 98%  Weight: 63.6 kg     Height:       Neurology awake and alert ENT with mild pallor Cardiovascular with S1 and S2 present and rhythmic Lungs with expiratory wheezing, and prolonged expiratory phase Abdomen not distended No lower extremity edema  Data Reviewed:    Family Communication: no family at the bedside   Disposition: Status is: Inpatient Remains inpatient appropriate because: copd exacerbation   Planned Discharge Destination: Home    Author: Tawni Millers, MD 09/21/2021 4:27 PM  For on call review [email or web address].

## 2021-09-22 DIAGNOSIS — J441 Chronic obstructive pulmonary disease with (acute) exacerbation: Secondary | ICD-10-CM | POA: Diagnosis not present

## 2021-09-22 DIAGNOSIS — R55 Syncope and collapse: Secondary | ICD-10-CM | POA: Diagnosis not present

## 2021-09-22 DIAGNOSIS — I5042 Chronic combined systolic (congestive) and diastolic (congestive) heart failure: Secondary | ICD-10-CM | POA: Diagnosis not present

## 2021-09-22 DIAGNOSIS — F419 Anxiety disorder, unspecified: Secondary | ICD-10-CM | POA: Diagnosis not present

## 2021-09-22 DIAGNOSIS — I10 Essential (primary) hypertension: Secondary | ICD-10-CM | POA: Diagnosis not present

## 2021-09-22 DIAGNOSIS — E875 Hyperkalemia: Secondary | ICD-10-CM

## 2021-09-22 LAB — BASIC METABOLIC PANEL
Anion gap: 10 (ref 5–15)
BUN: 32 mg/dL — ABNORMAL HIGH (ref 8–23)
CO2: 33 mmol/L — ABNORMAL HIGH (ref 22–32)
Calcium: 9.8 mg/dL (ref 8.9–10.3)
Chloride: 98 mmol/L (ref 98–111)
Creatinine, Ser: 1.05 mg/dL — ABNORMAL HIGH (ref 0.44–1.00)
GFR, Estimated: 58 mL/min — ABNORMAL LOW (ref >60)
Glucose, Bld: 123 mg/dL — ABNORMAL HIGH (ref 70–99)
Potassium: 5.4 mmol/L — ABNORMAL HIGH (ref 3.5–5.1)
Sodium: 141 mmol/L (ref 135–145)

## 2021-09-22 MED ORDER — FUROSEMIDE 40 MG PO TABS: 40.0000 mg | ORAL_TABLET | ORAL | Status: DC

## 2021-09-22 MED ORDER — ALBUTEROL SULFATE (2.5 MG/3ML) 0.083% IN NEBU
2.5000 mg | INHALATION_SOLUTION | RESPIRATORY_TRACT | Status: DC | PRN
  Administered 2021-09-22 – 2021-09-23 (×2): 2.5 mg via RESPIRATORY_TRACT
  Filled 2021-09-22 (×2): qty 3

## 2021-09-22 MED ORDER — SODIUM ZIRCONIUM CYCLOSILICATE 10 G PO PACK
10.0000 g | PACK | Freq: Once | ORAL | Status: AC
Start: 2021-09-22 — End: 2021-09-22
  Administered 2021-09-22: 10 g via ORAL
  Filled 2021-09-22: qty 1

## 2021-09-22 MED ORDER — MOMETASONE FURO-FORMOTEROL FUM 200-5 MCG/ACT IN AERO
2.0000 | INHALATION_SPRAY | Freq: Two times a day (BID) | RESPIRATORY_TRACT | Status: DC
  Administered 2021-09-22 – 2021-09-24 (×4): 2 via RESPIRATORY_TRACT
  Filled 2021-09-22: qty 8.8

## 2021-09-22 MED ORDER — IPRATROPIUM-ALBUTEROL 0.5-2.5 (3) MG/3ML IN SOLN
3.0000 mL | Freq: Two times a day (BID) | RESPIRATORY_TRACT | Status: DC
  Administered 2021-09-23 – 2021-09-24 (×3): 3 mL via RESPIRATORY_TRACT
  Filled 2021-09-22 (×4): qty 3

## 2021-09-22 MED ORDER — ALBUTEROL SULFATE HFA 108 (90 BASE) MCG/ACT IN AERS: 2.0000 | INHALATION_SPRAY | RESPIRATORY_TRACT | Status: DC | PRN

## 2021-09-22 NOTE — Progress Notes (Signed)
  Progress Note   Patient: Kim [name] [id number]:[id number] DOB: [birth year] DOA: 09/19/2021     3 DOS: the patient was seen and examined on 09/22/2021   Brief hospital course: Mrs. Szatkowski was admitted to the hospital with the working diagnosis of heart failure exacerbation.   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 Hospitalization 05/22 to 05/25 for SDH and syncope.  Recent hospitalization 06/13 to 06/14 for COPD exacerbation. At home noted weight gain about 10 lbs. Her blood pressure was elevated and called EMS. On her initial physical examination her blood pressure was 117/60, HR 79, RR 20 and 02 saturation 100%, her lungs had wheezing, heart S1 and S2 present and rhythmic, abdomen not distended, no lower extremity edema.   Na 144, K 3,8, Cl 110, bicarbonate 25, glucose 86, bun 27 cr 1,0 High sensitive troponin 181 and 169  Wbc 16.5 hgb 11,0 pl 278   Chest radiograph with hyperinflation, with left lower lobe atelectasis.   EKG 96 bpm, normal axis, normal intervals, sinus rhythm, with no significant ST segment or T wave changes.       Assessment and Plan: COPD with acute exacerbation ([place]) Acute on chronic hypoxemic and hypercapnic respiratory failure.  Slowly improving ventilation.  02 saturation is 100% on 1 L/min per .   Aggressive bronchodilator therapy, with albureral and imprtropium. Resume dulera and add bedside albuterol MDI. Continue with incentive spirometer and flutter valve. Continue with systemic steroids and antitussive agents.  Out of bed to chair tid with meals.  PT and OT for mobility.   Syncope and collapse -Zio patch placed after patient's syncopal episode subdural hematoma.    Chronic combined systolic (congestive) and diastolic (congestive) heart failure (HCC) Patient is clinically euvolemic today.   Continue with losartan for blood pressure control. Diuresis with furosemide  every 48 hrs.      Anxiety and depression -Stable overall -Continue Xanax and Paxil  Essential hypertension Continue with bisoprolol and losartan.   Hyperkalemia Renal function with serum cr at 1,05, with K at 5,4 and serum bicarbonate at 33.   Plan to add one dose of sodium zirconium and continue with losartan for now. Hold on supplemental K for now.  Follow up renal function and electrolytes in am.         Subjective: Patient is feeling better, but not back to baseline, continue to have dyspnea on exertion.   Physical Exam: Vitals:   09/22/21 0700 09/22/21 0717 09/22/21 0800 09/22/21 1000  BP:   (!) 116/53 102/71  Pulse:   72 70  Resp:   20 15  Temp:      TempSrc:      SpO2:  97% 97% 100%  Weight: 63.9 kg     Height:       Neurology awake and alert ENT with no pallor Cardiovascular with S1 and S2 present and rhythmic with no gallops, rubs or murmurs Respiratory with prolonged expiratory phase, positive expiratory wheezing, no rhonchi. Abdomen not distended No lower extremity edema  Data Reviewed:    Family Communication: no family at the bedside   Disposition: Status is: Inpatient Remains inpatient appropriate because: COPD exacerbation   Planned Discharge Destination: Home   Author: Tawni Millers, MD 09/22/2021 12:17 PM  For on call review [email or web address].

## 2021-09-22 NOTE — Progress Notes (Signed)
Progress Note  Patient Name: Kim [name] Date of Encounter: 09/22/2021  Primary Cardiologist: Lauree Chandler, MD   Subjective   Noted some position hypotension. Patient notes dizziness getting out of bed but feels well No CP, SOB, Palpitations.  Inpatient Medications    Scheduled Meds:  arformoterol  15 mcg Nebulization BID   aspirin EC  81 mg Oral Daily   bisoprolol  5 mg Oral Daily   budesonide (PULMICORT) nebulizer solution  0.5 mg Nebulization BID   calcium carbonate  1 tablet Oral Q breakfast   cholecalciferol  5,000 Units Oral Daily   furosemide  40 mg Oral Daily   guaiFENesin  1,200 mg Oral Q12H   ipratropium-albuterol  3 mL Nebulization BID   losartan  25 mg Oral Daily   magnesium oxide  400 mg Oral Daily   methylPREDNISolone (SOLU-MEDROL) injection  80 mg Intravenous Q24H   olopatadine  1 drop Left Eye Daily   PARoxetine  30 mg Oral Daily   Continuous Infusions:  lactated ringers Stopped (09/19/21 1446)   PRN Meds: acetaminophen, ALPRAZolam, butalbital-acetaminophen-caffeine, ipratropium-albuterol, nitroGLYCERIN, ondansetron **OR** ondansetron (ZOFRAN) IV   Vital Signs    Vitals:   09/22/21 0215 09/22/21 0541 09/22/21 0717 09/22/21 0800  BP:  125/66  (!) 116/53  Pulse: 68 64  72  Resp: '18 18  20  '$ Temp:  97.8 F (36.6 C)    TempSrc:  Oral    SpO2: 99% 96% 97% 97%  Weight:      Height:        Intake/Output Summary (Last 24 hours) at 09/22/2021 1003 Last data filed at 09/22/2021 0546 Gross per 24 hour  Intake 240 ml  Output 2150 ml  Net -1910 ml   Filed Weights   09/19/21 2221 09/20/21 0400 09/21/21 0521  Weight: 64 kg 64.5 kg 63.6 kg    Telemetry    SR - Personally Reviewed  Physical Exam   Gen: No distress   Neck: No JVD Cardiac: No Rubs or Gallops, no murmur, RRR +2 radial pulses Respiratory: expiratory wheeze normal effort, regular RR rate GI: Soft, nontender, non-distended  MS: no edema;  moves all  extremities Integument: Skin feels warm Neuro:  At time of evaluation, alert and oriented to person/place/time/situation  Psych: Normal affect, patient feels well   Labs    Chemistry Recent Labs  Lab 09/17/21 0444 09/17/21 0457 09/19/21 1044 09/19/21 1126 09/20/21 0318 09/22/21 0329  NA 140   < > 144 143 137 141  K 4.7   < > 3.8 3.8 4.3 5.4*  CL 106   < > 110  --  98 98  CO2 23   < > 25  --  25 33*  GLUCOSE 325*   < > 86  --  205* 123*  BUN 19   < > 27*  --  28* 32*  CREATININE 1.10*   < > 1.09*  --  1.02* 1.05*  CALCIUM 8.9   < > 8.7*  --  8.8* 9.8  PROT 5.6*  --  5.7*  --   --   --   ALBUMIN 3.4*  --  3.5  --   --   --   AST 19  --  20  --   --   --   ALT 16  --  17  --   --   --   ALKPHOS 90  --  70  --   --   --  BILITOT 0.4  --  0.4  --   --   --   GFRNONAA 55*   < > 56*  --  >60 58*  ANIONGAP 11   < > 9  --  14 10   < > = values in this interval not displayed.     Hematology Recent Labs  Lab 09/18/21 0109 09/19/21 1044 09/19/21 1126 09/20/21 0318  WBC 15.0* 16.5*  --  13.3*  RBC 3.38* 3.54*  --  3.53*  HGB 10.6* 11.0* 10.9* 11.0*  HCT 33.1* 35.9* 32.0* 34.1*  MCV 97.9 101.4*  --  96.6  MCH 31.4 31.1  --  31.2  MCHC 32.0 30.6  --  32.3  RDW 15.0 15.4  --  15.4  PLT 252 278  --  298    Cardiac EnzymesNo results for input(s): "TROPONINI" in the last 168 hours. No results for input(s): "TROPIPOC" in the last 168 hours.   BNP Recent Labs  Lab 09/17/21 0444 09/19/21 1127  BNP 175.0* 357.2*     DDimer No results for input(s): "DDIMER" in the last 168 hours.   Radiology    VAS US CAROTID  Result Date: 09/21/2021 Carotid Arterial Duplex Study Patient Name:  LYNEL FORESTER  Date of Exam:   09/20/2021 Medical Rec #: [id number]        Accession #:    [id number] Date of Birth: 07-29-1955       Patient Gender: F Patient Age:   15 years Exam Location:  [facility] Procedure:      VAS US CAROTID Referring Phys: [facility]  --------------------------------------------------------------------------------  Indications:       Syncope. Risk Factors:      Hypertension, hyperlipidemia. Comparison Study:  No previous exam noted. Performing Technologist: Bobetta Lime BS, RVT  Examination Guidelines: A complete evaluation includes B-mode imaging, spectral Doppler, color Doppler, and power Doppler as needed of all accessible portions of each vessel. Bilateral testing is considered an integral part of a complete examination. Limited examinations for reoccurring indications may be performed as noted.  Right Carotid Findings: +----------+--------+--------+--------+------------------+--------+           PSV cm/sEDV cm/sStenosisPlaque DescriptionComments +----------+--------+--------+--------+------------------+--------+ CCA Prox  73      13                                         +----------+--------+--------+--------+------------------+--------+ CCA Distal81      15                                         +----------+--------+--------+--------+------------------+--------+ ICA Prox  81      22              heterogenous               +----------+--------+--------+--------+------------------+--------+ ICA Mid   161     44                                tortuous +----------+--------+--------+--------+------------------+--------+ ICA Distal74      25                                         +----------+--------+--------+--------+------------------+--------+  ECA       178     17              heterogenous               +----------+--------+--------+--------+------------------+--------+ +----------+--------+-------+----------------+-------------------+           PSV cm/sEDV cmsDescribe        Arm Pressure (mmHG) +----------+--------+-------+----------------+-------------------+ Subclavian213            Multiphasic, WNL                     +----------+--------+-------+----------------+-------------------+ +---------+--------+--+--------+--+---------+ VertebralPSV cm/s52EDV cm/s13Antegrade +---------+--------+--+--------+--+---------+  Left Carotid Findings: +----------+--------+--------+--------+------------------+--------+           PSV cm/sEDV cm/sStenosisPlaque DescriptionComments +----------+--------+--------+--------+------------------+--------+ CCA Prox  110     26              heterogenous               +----------+--------+--------+--------+------------------+--------+ CCA Distal98      27              heterogenous               +----------+--------+--------+--------+------------------+--------+ ICA Prox  91      22              heterogenous               +----------+--------+--------+--------+------------------+--------+ ICA Distal86      26                                         +----------+--------+--------+--------+------------------+--------+ ECA       115     15              heterogenous               +----------+--------+--------+--------+------------------+--------+ +----------+--------+--------+----------------+-------------------+           PSV cm/sEDV cm/sDescribe        Arm Pressure (mmHG) +----------+--------+--------+----------------+-------------------+ [id number]             Multiphasic, WNL                    +----------+--------+--------+----------------+-------------------+ +---------+--------+--+--------+-+---------+ VertebralPSV cm/s33EDV cm/s8Antegrade +---------+--------+--+--------+-+---------+   Summary: Right Carotid: Velocities in the right ICA are consistent with a 1-39% stenosis. Left Carotid: Velocities in the left ICA are consistent with a 1-39% stenosis. Vertebrals:  Bilateral vertebral arteries demonstrate antegrade flow. Subclavians: Normal flow hemodynamics were seen in bilateral subclavian              arteries. *See table(s) above for  measurements and observations.  Electronically signed by Jamelle Haring on 09/21/2021 at 12:09:26 PM.    Final      Patient Profile     66 y.o. 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 Leukocytosis (potentially steroid related) Anxiety with depression  Chronic systolic and diastolic heart failure  Hx of Takotsubo cardiomyopathy, recurrent  - Echo shows LV recovery - continuing bisoprolol - will continue her low dose ARB - do not have further BP room for GDMT - lasix 40 mg now spaced to q 48 with positional hypotension, was PRN prior to DC,  has 10 meq K for maintenance   Syncope Non-obstructive CAD Recent subdural hematoma -  will continue ASA  if there are questions of safety we could formally consult NSY (has been on it for some time with no issues, I sent a message to her neurosurgeon, if we do not get a reply, I recommend touching base for clarification for the future - carotid duplex mild bilateral CAS    For questions or updates, please contact [facility] Please consult [email or web address] for contact info under Cardiology/STEMI.      Rudean Haskell, MD Cardiologist Hypertrophic [place], [address] [place], [place] [address] [phone]  10:03 AM

## 2021-09-22 NOTE — Assessment & Plan Note (Signed)
Renal function with serum cr at 1,05, with K at 5,4 and serum bicarbonate at 33.   Plan to add one dose of sodium zirconium and continue with losartan for now. Hold on supplemental K for now.  Follow up renal function and electrolytes in am.

## 2021-09-22 NOTE — Progress Notes (Signed)
Patient and husband stated they will place BiPAP on/off as needed. Informed them to call if assistance is needed.

## 2021-09-23 ENCOUNTER — Ambulatory Visit (HOSPITAL_COMMUNITY): Payer: Medicare Other

## 2021-09-23 DIAGNOSIS — J441 Chronic obstructive pulmonary disease with (acute) exacerbation: Secondary | ICD-10-CM | POA: Diagnosis not present

## 2021-09-23 DIAGNOSIS — F419 Anxiety disorder, unspecified: Secondary | ICD-10-CM | POA: Diagnosis not present

## 2021-09-23 DIAGNOSIS — R55 Syncope and collapse: Secondary | ICD-10-CM | POA: Diagnosis not present

## 2021-09-23 DIAGNOSIS — I5042 Chronic combined systolic (congestive) and diastolic (congestive) heart failure: Secondary | ICD-10-CM | POA: Diagnosis not present

## 2021-09-23 LAB — BASIC METABOLIC PANEL
Anion gap: 11 (ref 5–15)
BUN: 38 mg/dL — ABNORMAL HIGH (ref 8–23)
CO2: 30 mmol/L (ref 22–32)
Calcium: 9.8 mg/dL (ref 8.9–10.3)
Chloride: 99 mmol/L (ref 98–111)
Creatinine, Ser: 1.07 mg/dL — ABNORMAL HIGH (ref 0.44–1.00)
GFR, Estimated: 57 mL/min — ABNORMAL LOW (ref >60)
Glucose, Bld: 94 mg/dL (ref 70–99)
Potassium: 4.6 mmol/L (ref 3.5–5.1)
Sodium: 140 mmol/L (ref 135–145)

## 2021-09-23 MED ORDER — METHYLPREDNISOLONE SODIUM SUCC 40 MG IJ SOLR
40.0000 mg | INTRAMUSCULAR | Status: DC
  Administered 2021-09-23: 40 mg via INTRAVENOUS
  Filled 2021-09-23: qty 1

## 2021-09-23 MED ORDER — ALBUTEROL SULFATE HFA 108 (90 BASE) MCG/ACT IN AERS
2.0000 | INHALATION_SPRAY | RESPIRATORY_TRACT | Status: DC | PRN
  Filled 2021-09-23 (×2): qty 6.7

## 2021-09-23 NOTE — Care Management Important Message (Signed)
Important Message  Patient Details  Name: Kim [name] MRN: [id number] Date of Birth: 1955/10/20   Medicare Important Message Given:  Other (see comment)     Hannah Beat 09/23/2021, 2:19 PM

## 2021-09-23 NOTE — Progress Notes (Signed)
6/19 Initial IM Letter rcv'd by patient, letter has been explained and acknowledged via telephone.

## 2021-09-23 NOTE — Progress Notes (Signed)
Patient has BIPAP at beside. 5L O2 bleed In needed. Patient able to place on and off when ready.

## 2021-09-23 NOTE — Plan of Care (Signed)
  Problem: Education: Goal: Knowledge of disease or condition will improve Outcome: Progressing Goal: Knowledge of the prescribed therapeutic regimen will improve Outcome: Progressing   Problem: Activity: Goal: Ability to tolerate increased activity will improve Outcome: Progressing   

## 2021-09-23 NOTE — Progress Notes (Signed)
Progress Note   Patient: Kim [name] KDT:[id number] DOB: Mar 27, 1956 DOA: 09/19/2021     4 DOS: the patient was seen and examined on 09/23/2021   Brief hospital course: Kim [name] was admitted to the hospital with the working diagnosis of heart failure exacerbation.   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 Hospitalization 05/22 to 05/25 for SDH and syncope.  Recent hospitalization 06/13 to 06/14 for COPD exacerbation. At home noted weight gain about 10 lbs. Her blood pressure was elevated and called EMS. On her initial physical examination her blood pressure was 117/60, HR 79, RR 20 and 02 saturation 100%, her lungs had wheezing, heart S1 and S2 present and rhythmic, abdomen not distended, no lower extremity edema.   Na 144, K 3,8, Cl 110, bicarbonate 25, glucose 86, bun 27 cr 1,0 High sensitive troponin 181 and 169  Wbc 16.5 hgb 11,0 pl 278   Chest radiograph with hyperinflation, with left lower lobe atelectasis.   EKG 96 bpm, normal axis, normal intervals, sinus rhythm, with no significant ST segment or T wave changes.   Patient was placed on bronchodilator therapy and systemic steroids with  improvement of her symptoms.   Short course of diuretic therapy with furosemide.   Assessment and Plan: COPD with acute exacerbation ([place]) Acute on chronic hypoxemic and hypercapnic respiratory failure.  Slowly improving ventilation.  02 saturation is 99% on 2 L/min per [place].   Continue with aggressive bronchodilator therapy, with albureral and imprtropium. Continue with dulera and add bedside albuterol MDI. Incentive spirometer and flutter valve. Antitussive agents.   Plan to decrease methylprednisolone to 40 mg daily.   Out of bed to chair tid with meals.  PT and OT for mobility.   Syncope and collapse -Zio patch placed after patient's syncopal episode subdural hematoma.    Chronic combined systolic  (congestive) and diastolic (congestive) heart failure (HCC) Patient is clinically euvolemic today.   Continue with losartan for blood pressure control. PRN furosemide for signs of volume overload.       Anxiety and depression -Stable overall -Continue Xanax and Paxil  Essential hypertension Continue with bisoprolol and losartan.   Hyperkalemia Renal function continue to be stable with serum cr at 1,0  K is 4,6 and serum bicarbonate at 30 Continue furosemide only as needed.         Subjective: Patient with slow improvement in dyspnea, no chest pain, no nausea or vomiting.  Physical Exam: Vitals:   09/23/21 0935 09/23/21 0944 09/23/21 1146 09/23/21 1158  BP: (!) 124/54 (!) 102/56    Pulse: 78 79    Resp: 17 15    Temp: 97.9 F (36.6 C)     TempSrc: Oral     SpO2: 95% 95% 99% 100%  Weight:  64.1 kg    Height:        Neurology awake and alert ENT with no pallor Cardiovascular with S1 and S2 present and rhythmic with no gallops, rubs or murmurs Respiratory with prolonged expiratory phase, no wheezing and improved ventilation, scattered rales  Abdomen soft and not distended No lower extremity edema  Data Reviewed:    Family Communication: I spoke with patient's family at the bedside, we talked in detail about patient's condition, plan of care and prognosis and all questions were addressed.   Disposition: Status is: Inpatient Remains inpatient appropriate because: COPD exacerbation not back to baseline   Planned Discharge Destination: Home  Author: Tawni Millers, MD 09/23/2021 12:58 PM  For on call review [email or web address].

## 2021-09-23 NOTE — Progress Notes (Signed)
Progress Note  Patient Name: Kim [name] Date of Encounter: 09/23/2021  Primary Cardiologist: Lauree Chandler, MD   Subjective   Breathing improved. No pain.   Inpatient Medications    Scheduled Meds:  aspirin EC  81 mg Oral Daily   bisoprolol  5 mg Oral Daily   calcium carbonate  1 tablet Oral Q breakfast   cholecalciferol  5,000 Units Oral Daily   [START ON 09/24/2021] furosemide  40 mg Oral Q48H   guaiFENesin  1,200 mg Oral Q12H   ipratropium-albuterol  3 mL Nebulization BID   losartan  25 mg Oral Daily   magnesium oxide  400 mg Oral Daily   methylPREDNISolone (SOLU-MEDROL) injection  80 mg Intravenous Q24H   mometasone-formoterol  2 puff Inhalation BID   olopatadine  1 drop Left Eye Daily   PARoxetine  30 mg Oral Daily   Continuous Infusions:   PRN Meds: acetaminophen, albuterol, ALPRAZolam, butalbital-acetaminophen-caffeine, ipratropium-albuterol, nitroGLYCERIN, ondansetron **OR** ondansetron (ZOFRAN) IV   Vital Signs    Vitals:   09/22/21 2305 09/23/21 0400 09/23/21 0815 09/23/21 0816  BP:  (!) 128/58    Pulse:  62    Resp:  18    Temp:  97.8 F (36.6 C)    TempSrc:      SpO2: 94% 97% 98% 98%  Weight:      Height:        Intake/Output Summary (Last 24 hours) at 09/23/2021 0905 Last data filed at 09/23/2021 0902 Gross per 24 hour  Intake 360 ml  Output 1675 ml  Net -1315 ml   Filed Weights   09/20/21 0400 09/21/21 0521 09/22/21 0700  Weight: 64.5 kg 63.6 kg 63.9 kg    Telemetry    sinus - Personally Reviewed  Physical Exam   General: Well developed, well nourished, NAD  HEENT: OP clear, mucus membranes moist  SKIN: warm, dry. No rashes. Neuro: No focal deficits  Musculoskeletal: Muscle strength 5/5 all ext  Psychiatric: Mood and affect normal  Neck: No JVD Lungs:Clear bilaterally, no wheezes, rhonci, crackles Cardiovascular: Regular rate and rhythm. No murmurs, gallops or rubs. Abdomen:Soft. Bowel sounds present. Non-tender.   Extremities: No lower extremity edema.   Labs    Chemistry Recent Labs  Lab 09/17/21 0444 09/17/21 0457 09/19/21 1044 09/19/21 1126 09/20/21 0318 09/22/21 0329 09/23/21 0425  NA 140   < > 144   < > 137 141 140  K 4.7   < > 3.8   < > 4.3 5.4* 4.6  CL 106   < > 110  --  98 98 99  CO2 23   < > 25  --  25 33* 30  GLUCOSE 325*   < > 86  --  205* 123* 94  BUN 19   < > 27*  --  28* 32* 38*  CREATININE 1.10*   < > 1.09*  --  1.02* 1.05* 1.07*  CALCIUM 8.9   < > 8.7*  --  8.8* 9.8 9.8  PROT 5.6*  --  5.7*  --   --   --   --   ALBUMIN 3.4*  --  3.5  --   --   --   --   AST 19  --  20  --   --   --   --   ALT 16  --  17  --   --   --   --   ALKPHOS 90  --  70  --   --   --   --  BILITOT 0.4  --  0.4  --   --   --   --   GFRNONAA 55*   < > 56*  --  >60 58* 57*  ANIONGAP 11   < > 9  --  '14 10 11   '$ < > = values in this interval not displayed.     Hematology Recent Labs  Lab 09/18/21 0109 09/19/21 1044 09/19/21 1126 09/20/21 0318  WBC 15.0* 16.5*  --  13.3*  RBC 3.38* 3.54*  --  3.53*  HGB 10.6* 11.0* 10.9* 11.0*  HCT 33.1* 35.9* 32.0* 34.1*  MCV 97.9 101.4*  --  96.6  MCH 31.4 31.1  --  31.2  MCHC 32.0 30.6  --  32.3  RDW 15.0 15.4  --  15.4  PLT 252 278  --  298    Cardiac EnzymesNo results for input(s): "TROPONINI" in the last 168 hours. No results for input(s): "TROPIPOC" in the last 168 hours.   BNP Recent Labs  Lab 09/17/21 0444 09/19/21 1127  BNP 175.0* 357.2*     DDimer No results for input(s): "DDIMER" in the last 168 hours.   Radiology    No results found.   Patient Profile     66 y.o. 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   Assessment & Plan    Acute on chronic hypoxic respiratory failure secondary to COPD: Per primary  team. Improved  2.   Chronic combined systolic and diastolic CHF: She appears euvolemic today. Echo with normal LV function. Continue GDMT with beta blocker, ARB. Continue Lasix 40 mg as needed at home. She will follow daily weights.    Will sign off today.      For questions or updates, please contact [facility] Please consult [email or web address] for contact info under Cardiology/STEMI.      Rudean Haskell, MD Cardiologist Hypertrophic [address], [address] [place], [place] [address] [phone]  9:05 AM

## 2021-09-23 NOTE — Progress Notes (Signed)
Mobility Specialist Progress Note    09/23/21 1146  Orthostatic Lying   BP- Lying 132/54  Pulse- Lying 66  Orthostatic Sitting  BP- Sitting 133/57  Pulse- Sitting 69  Orthostatic Standing at 0 minutes  BP- Standing at 0 minutes (!) 140/104  Pulse- Standing at 0 minutes 77  Oxygen Therapy  SpO2 99 %  O2 Device Nasal Cannula  O2 Flow Rate (L/min) 2 L/min  Patient Activity (if Appropriate) In bed  Mobility  Activity Ambulated with assistance in hallway  Level of Assistance Contact guard assist, steadying assist  Assistive Device Four wheel walker  Distance Ambulated (ft) 220 ft  Activity Response Tolerated fair  $Mobility charge 1 Mobility   Pre-Mobility: 66 HR, 99% SpO2 During Mobility: 99% on 3L SpO2 Post-Mobility: 71 HR, 131/66 BP, 95% SpO2  Pt received in bed and agreeable. Upon standing, c/o dizziness and stated her visitor was all over the place. Took a seated rest break on EOB. Ambulated on 3LO2 w/ no complaints. Returned to bed with call bell in reach requesting breathing treatment. RN notified.   Hildred Alamin Mobility Specialist

## 2021-09-24 DIAGNOSIS — J441 Chronic obstructive pulmonary disease with (acute) exacerbation: Secondary | ICD-10-CM | POA: Diagnosis not present

## 2021-09-24 DIAGNOSIS — I5042 Chronic combined systolic (congestive) and diastolic (congestive) heart failure: Secondary | ICD-10-CM | POA: Diagnosis not present

## 2021-09-24 DIAGNOSIS — F419 Anxiety disorder, unspecified: Secondary | ICD-10-CM | POA: Diagnosis not present

## 2021-09-24 DIAGNOSIS — R55 Syncope and collapse: Secondary | ICD-10-CM | POA: Diagnosis not present

## 2021-09-24 NOTE — TOC Transition Note (Signed)
Transition of Care [facility]) - CM/SW Discharge Note   Patient Details  Name: Kim [name] MRN: [id number] Date of Birth: May 10, 1955  [facility] CM/SW Contact:  Zenon Mayo, RN Phone Number: 09/24/2021, 12:30 PM   Clinical Narrative:    Patient is for dc today, her spouse will transport her home, she has no needs.      Barriers to Discharge: Continued Medical Work up   Patient Goals and CMS Choice Patient states their goals for this hospitalization and ongoing recovery are:: return home      Discharge Placement                       Discharge Plan and Services   Discharge Planning Services: CM Consult Post Acute Care Choice: NA            DME Agency: NA       HH Arranged: NA          Social Determinants of Health (SDOH) Interventions     Readmission Risk Interventions    09/20/2021    3:27 PM  Readmission Risk Prevention Plan  Transportation Screening Complete  Medication Review ([place]) Complete  PCP or Specialist appointment within 3-5 days of discharge Complete  HRI or [place] Complete  SW Recovery Care/Counseling Consult Complete  [place] Not Applicable

## 2021-09-24 NOTE — Discharge Summary (Signed)
Physician Discharge Summary   Patient: Kim [name] MRN: [id number] DOB: 1955-11-19  Admit date:     09/19/2021  Discharge date: 09/24/21  Discharge Physician: Jimmy Picket Akon Reinoso   PCP: [name], NP   Recommendations at discharge:    Patient will continue with prednisone taper, with 40 mg for 3 days, then 20 mg for 3 days and then continue with her maintenance dose of 10 mg daily. Follow up with Pulmonary as outpatient.  Continue bronchodilator therapy.   Discharge Diagnoses: Active Problems:   COPD with acute exacerbation (HCC)   Syncope and collapse   Chronic combined systolic (congestive) and diastolic (congestive) heart failure (HCC)   Anxiety and depression   Essential hypertension   Hyperkalemia  Resolved Problems:   * No resolved hospital problems. [facility] Course: Kim [name] was admitted to the hospital with the working diagnosis of COPD exacerbation and congested heart failure exacerbation.   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 Hospitalization 05/22 to 05/25 for SDH and syncope.  Recent hospitalization 06/13 to 06/14 for COPD exacerbation. At home noted weight gain about 10 lbs. Her blood pressure was elevated and called EMS. On her initial physical examination her blood pressure was 117/60, HR 79, RR 20 and 02 saturation 100%, her lungs had wheezing, heart S1 and S2 present and rhythmic, abdomen not distended, no lower extremity edema.   Na 144, K 3,8, Cl 110, bicarbonate 25, glucose 86, bun 27 cr 1,0 High sensitive troponin 181 and 169  Wbc 16.5 hgb 11,0 pl 278   Chest radiograph with hyperinflation, with left lower lobe atelectasis.   EKG 96 bpm, normal axis, normal intervals, sinus rhythm, with no significant ST segment or T wave changes.   Short course of IV furosemide with improvement in her volume status.   Patient was placed on bronchodilator therapy and  systemic steroids with  improvement of her symptoms.  Patient will continue taking prednisone taper down to 10 mg daily that is her maintenance dose.  Follow up with cardiology and pulmonary as outpatient.     Assessment and Plan: COPD with acute exacerbation ([place]) Acute on chronic hypoxemic and hypercapnic respiratory failure.  Patient was placed on aggressive bronchodilator therapy, with albureral and imprtropium. Dulera and airway clearing techniques with Incentive spirometer and flutter valve. Antitussive agents.   Systemic corticosteroids with methylprednisolone.  Her symptoms improved with improvement in oxygenation.  At the time of her discharge her oxygenation is 100% on 2 L.min per .   She will follow up with Pulmonary as outpatient, continue slow prednisone taper down to her home maintenance dose of 10 mg daily.   Syncope and collapse -Zio patch placed after patient's syncopal episode subdural hematoma.    Chronic combined systolic (congestive) and diastolic (congestive) heart failure ([place]) Patient had a short course of IV furosemide with improvement in volume status and symptoms.   At discharge will continue with losartan anda bisoprolol for blood pressure control and continue with as needed furosemide for signs of volume overload.   Echocardiogram shoed preserved LV systolic function with EF 60 to 29%, RV systolic function preserved. No significant valvular disease.   Plan to follow up as outpatient with cardiology .       Anxiety and depression -Stable overall -Continue Xanax and Paxil  Essential hypertension Continue with bisoprolol and losartan.   Hyperkalemia Electrolytes were corrected, at the time of her discharge her rena  function remained stable with serum cr at 1,0 with K at 4,6 and serum bicarbonate at 30.  Plan to continue taking furosemide as needed and follow up renal function as outpatient.          Consultants: cardiology   Procedures performed: none   Disposition: Home Diet recommendation:  Discharge Diet Orders (From admission, onward)     Start     Ordered   09/24/21 0000  Diet - low sodium heart healthy        09/24/21 1223           Cardiac diet DISCHARGE MEDICATION: Allergies as of 09/24/2021       Reactions   Tape Other (See Comments)   Skin tears easily, peels skin off   Daliresp [roflumilast]    Skin peeling   Latex Other (See Comments)   Peels skin off   Lisinopril Cough   Morphine And Related    hallucinations   Penicillins Hives, Other (See Comments)   Tolerates Omnicef Has patient had a PCN reaction causing immediate rash, facial/tongue/throat swelling, SOB or lightheadedness with hypotension: Yes Has patient had a PCN reaction causing severe rash involving mucus membranes or skin necrosis: No Has patient had a PCN reaction that required hospitalization No Has patient had a PCN reaction occurring within the last 10 years: No If all of the above answers are "NO", then may proceed with Cephalosporin use.   Omnicef [cefdinir] Rash        Medication List     TAKE these medications    acetaminophen 500 MG tablet Commonly known as: TYLENOL Take 500 mg by mouth 2 (two) times daily as needed for headache.   albuterol 108 (90 Base) MCG/ACT inhaler Commonly known as: VENTOLIN HFA INHALE TWO PUFFS INTO LUNGS EVERY 4 TO 6 HOURS AS NEEDED FOR SHORTNESS OF BREATH/WHEEZING. What changed:  how much to take how to take this when to take this reasons to take this additional instructions   ALPRAZolam 0.5 MG tablet Commonly known as: XANAX Take 1 tablet (0.5 mg total) by mouth 3 (three) times daily as needed for anxiety.   aspirin EC 81 MG tablet Take 1 tablet (81 mg total) by mouth daily. What changed: when to take this   bisoprolol 5 MG tablet Commonly known as: ZEBETA Take 0.5 tablets (2.5 mg total) by mouth daily.   butalbital-acetaminophen-caffeine 50-325-40 MG  tablet Commonly known as: FIORICET Take 1 tablet by mouth every 6 (six) hours as needed for headache.   CALCIUM 600 + D PO Take 600 mg by mouth in the morning and at bedtime. Calcium 600 mg + 4000 unit D3   cetirizine 10 MG tablet Commonly known as: ZYRTEC Take 10 mg by mouth daily.   furosemide 40 MG tablet Commonly known as: LASIX Take 1 tablet (40 mg total) by mouth daily as needed (for an overnight weight gain of 3-5 pounds of fluid).   Ginkgo Biloba Extract 60 MG Caps Take 1 capsule by mouth daily.   HYDROcodone-acetaminophen 7.5-325 MG tablet Commonly known as: Norco Take 1 tablet by mouth every 6 (six) hours as needed for moderate pain.   ipratropium-albuterol 0.5-2.5 (3) MG/3ML Soln Commonly known as: DUONEB USE 3 ML VIA NEBULIZER EVERY 6 HOURS AS NEEDED.  DX: J44.9 What changed:  how much to take how to take this additional instructions   lidocaine 5 % Commonly known as: LIDODERM Place 1 patch onto the skin daily. Remove & Discard patch within 12 hours  or as directed by MD What changed: when to take this   losartan 25 MG tablet Commonly known as: COZAAR Take 1 tablet (25 mg total) by mouth daily.   magnesium oxide 400 MG tablet Commonly known as: MAG-OX Take 400 mg by mouth daily.   Mucinex Maximum Strength 1200 MG Tb12 Generic drug: Guaifenesin Take 1,200 mg by mouth every 12 (twelve) hours.   nitroGLYCERIN 0.4 MG SL tablet Commonly known as: NITROSTAT Place 1 tablet (0.4 mg total) under the tongue every 5 (five) minutes as needed for chest pain.   nystatin 100000 UNIT/ML suspension Commonly known as: MYCOSTATIN Take 5 mLs by mouth 3 (three) times daily as needed (thrush).   ondansetron 4 MG tablet Commonly known as: Zofran Take 1 tablet (4 mg total) by mouth every 8 (eight) hours as needed for nausea or vomiting.   OXYGEN Inhale 2 L/min into the lungs continuous.   PARoxetine 30 MG tablet Commonly known as: PAXIL Take 1 tablet (30 mg total)  by mouth daily.   PATADAY OP Place 1 drop into the left eye daily.   potassium chloride 10 MEQ tablet Commonly known as: KLOR-CON Take 1 tablet (10 mEq total) by mouth as needed (take with furosemide (lasix)).   predniSONE 10 MG tablet Commonly known as: DELTASONE Take 1 tablet (10 mg total) by mouth daily. What changed: Another medication with the same name was removed. Continue taking this medication, and follow the directions you see here.   Spacer/Aero Chamber Mouthpiece Misc 1 Device by Does not apply route as directed.   Spiriva Respimat 2.5 MCG/ACT Aers Generic drug: Tiotropium Bromide Monohydrate Inhale 2 puffs into the lungs daily. What changed:  when to take this reasons to take this   Symbicort 160-4.5 MCG/ACT inhaler Generic drug: budesonide-formoterol INHALE 2 PUFFS TWICE DAILY What changed: how to take this   VITAMIN D3 PO Take 5,000 Units by mouth daily.               Durable Medical Equipment  (From admission, onward)           Start     Ordered   09/20/21 1540  For home use only DME Bedside commode  Once       Question:  Patient needs a bedside commode to treat with the following condition  Answer:  Weakness   09/20/21 [place] Follow up on 09/25/2021.   Specialty: Rehabilitation Why: 1:14- has scheduled apt for vestibular physical therapy already Contact information: [address] [place], [place], NP Follow up on 09/27/2021.   Specialty: Family Medicine Why: 2:00 for follow Contact information: [place] [address] [phone]         [facility] Follow up.   Why: [facility] Contact information: [address] [place] [address] [phone]                Discharge Exam: Filed Weights   09/22/21 0700 09/23/21  0944 09/24/21 0300  Weight: 63.9 kg 64.1 kg 64.5 kg   BP 127/62 (BP Location: Right Arm)   Pulse 61   Temp 98.2 F (36.8 C) (Oral)   Resp 19   Ht '5\' 1"'$  (1.549 m)   Wt 64.5 kg   SpO2 100%   BMI 26.87 kg/m  Patient is feeling better, her dyspnea is back to baseline, no chest pain.   Neurology awake and alert ENT with no pallor Cardiovascular with S1 and S2 present and rhythmic with no gallops, rubs or murmurs Respiratory with improved ventilation, scattered rhonchi but not wheezing. Prolonged expiratory phase but has improved significantly Abdomen with no distention  No lower extremity edema   Condition at discharge: stable  The results of significant diagnostics from this hospitalization (including imaging, microbiology, ancillary and laboratory) are listed below for reference.   Imaging Studies: VAS US CAROTID  Result Date: 09/21/2021 Carotid Arterial Duplex Study Patient Name:  KHIANA CAMINO  Date of Exam:   09/20/2021 Medical Rec #: [id number]        Accession #:    [id number] Date of Birth: 17-Oct-1955       Patient Gender: F Patient Age:   34 years Exam Location:  [facility] Procedure:      VAS US CAROTID Referring Phys: [facility] --------------------------------------------------------------------------------  Indications:       Syncope. Risk Factors:      Hypertension, hyperlipidemia. Comparison Study:  No previous exam noted. Performing Technologist: Bobetta Lime BS, RVT  Examination Guidelines: A complete evaluation includes B-mode imaging, spectral Doppler, color Doppler, and power Doppler as needed of all accessible portions of each vessel. Bilateral testing is considered an integral part of a complete examination. Limited examinations for reoccurring indications may be performed as noted.  Right Carotid Findings: +----------+--------+--------+--------+------------------+--------+           PSV cm/sEDV cm/sStenosisPlaque DescriptionComments  +----------+--------+--------+--------+------------------+--------+ CCA Prox  73      13                                         +----------+--------+--------+--------+------------------+--------+ CCA Distal81      15                                         +----------+--------+--------+--------+------------------+--------+ ICA Prox  81      22              heterogenous               +----------+--------+--------+--------+------------------+--------+ ICA Mid   161     44                                tortuous +----------+--------+--------+--------+------------------+--------+ ICA Distal74      25                                         +----------+--------+--------+--------+------------------+--------+ ECA       178     17              heterogenous               +----------+--------+--------+--------+------------------+--------+ +----------+--------+-------+----------------+-------------------+           PSV cm/sEDV cmsDescribe        Arm Pressure (mmHG) +----------+--------+-------+----------------+-------------------+ Subclavian213            Multiphasic, WNL                    +----------+--------+-------+----------------+-------------------+ +---------+--------+--+--------+--+---------+  VertebralPSV cm/s52EDV cm/s13Antegrade +---------+--------+--+--------+--+---------+  Left Carotid Findings: +----------+--------+--------+--------+------------------+--------+           PSV cm/sEDV cm/sStenosisPlaque DescriptionComments +----------+--------+--------+--------+------------------+--------+ CCA Prox  110     26              heterogenous               +----------+--------+--------+--------+------------------+--------+ CCA Distal98      27              heterogenous               +----------+--------+--------+--------+------------------+--------+ ICA Prox  91      22              heterogenous                +----------+--------+--------+--------+------------------+--------+ ICA Distal86      26                                         +----------+--------+--------+--------+------------------+--------+ ECA       115     15              heterogenous               +----------+--------+--------+--------+------------------+--------+ +----------+--------+--------+----------------+-------------------+           PSV cm/sEDV cm/sDescribe        Arm Pressure (mmHG) +----------+--------+--------+----------------+-------------------+ [id number]             Multiphasic, WNL                    +----------+--------+--------+----------------+-------------------+ +---------+--------+--+--------+-+---------+ VertebralPSV cm/s33EDV cm/s8Antegrade +---------+--------+--+--------+-+---------+   Summary: Right Carotid: Velocities in the right ICA are consistent with a 1-39% stenosis. Left Carotid: Velocities in the left ICA are consistent with a 1-39% stenosis. Vertebrals:  Bilateral vertebral arteries demonstrate antegrade flow. Subclavians: Normal flow hemodynamics were seen in bilateral subclavian              arteries. *See table(s) above for measurements and observations.  Electronically signed by Jamelle Haring on 09/21/2021 at 12:09:26 PM.    Final    ECHOCARDIOGRAM LIMITED  Result Date: 09/20/2021    ECHOCARDIOGRAM LIMITED REPORT   Patient Name:   KERSTYN CORYELL Date of Exam: 09/20/2021 Medical Rec #:  [id number]       Height:       61.0 in Accession #:    [id number]      Weight:       142.2 lb Date of Birth:  April 18, 1955      BSA:          1.634 m Patient Age:    18 years        BP:           101/50 mmHg Patient Gender: F               HR:           72 bpm. Exam Location:  Inpatient Procedure: Limited Echo, Cardiac Doppler and Color Doppler Indications:    dyspnea  History:        Patient has prior history of Echocardiogram examinations, most                 recent 08/26/2021. COPD,  Signs/Symptoms:Syncope; Risk                 Factors:Hypertension and Dyslipidemia.  Sonographer:    Johny Chess RDCS Referring Phys: [id number] [place] A CHANDRASEKHAR IMPRESSIONS  1. Left ventricular ejection fraction, by estimation, is 60 to 65%. The left ventricle has normal function. The left ventricle has no regional wall motion abnormalities. Left ventricular diastolic parameters were normal.  2. Right ventricular systolic function is normal. The right ventricular size is normal.  3. The mitral valve is normal in structure. No evidence of mitral valve regurgitation. No evidence of mitral stenosis.  4. The aortic valve was not well visualized. There is mild calcification of the aortic valve. Aortic valve regurgitation is mild to moderate. Aortic valve sclerosis is present, with no evidence of aortic valve stenosis.  5. The inferior vena cava is normal in size with greater than 50% respiratory variability, suggesting right atrial pressure of 3 mmHg. FINDINGS  Left Ventricle: Left ventricular ejection fraction, by estimation, is 60 to 65%. The left ventricle has normal function. The left ventricle has no regional wall motion abnormalities. The left ventricular internal cavity size was normal in size. There is  no left ventricular hypertrophy. Left ventricular diastolic parameters were normal. Right Ventricle: The right ventricular size is normal. No increase in right ventricular wall thickness. Right ventricular systolic function is normal. Left Atrium: Left atrial size was normal in size. Right Atrium: Right atrial size was normal in size. Pericardium: There is no evidence of pericardial effusion. Mitral Valve: The mitral valve is normal in structure. No evidence of mitral valve stenosis. Tricuspid Valve: The tricuspid valve is normal in structure. Tricuspid valve regurgitation is not demonstrated. No evidence of tricuspid stenosis. Aortic Valve: The aortic valve was not well visualized. There is mild  calcification of the aortic valve. Aortic valve regurgitation is mild to moderate. Aortic regurgitation PHT measures 380 msec. Aortic valve sclerosis is present, with no evidence of aortic valve stenosis. Pulmonic Valve: The pulmonic valve was normal in structure. Pulmonic valve regurgitation is not visualized. No evidence of pulmonic stenosis. Aorta: The aortic root is normal in size and structure. Venous: The inferior vena cava is normal in size with greater than 50% respiratory variability, suggesting right atrial pressure of 3 mmHg. IAS/Shunts: No atrial level shunt detected by color flow Doppler. LEFT VENTRICLE PLAX 2D LVIDd:         4.50 cm Diastology LVIDs:         3.30 cm LV e' medial:    8.55 cm/s LV PW:         0.90 cm LV E/e' medial:  9.2 LV IVS:        0.90 cm LV e' lateral:   9.32 cm/s                        LV E/e' lateral: 8.4  IVC IVC diam: 1.50 cm AORTIC VALVE AI PHT:      380 msec MITRAL VALVE MV Area (PHT): 3.99 cm MV Decel Time: 190 msec MV E velocity: 78.60 cm/s MV A velocity: 82.30 cm/s MV E/A ratio:  0.96 Jenkins Rouge MD Electronically signed by Jenkins Rouge MD Signature Date/Time: 09/20/2021/10:35:53 AM    Final    CT CHEST ABDOMEN PELVIS WO CONTRAST  Result Date: 09/19/2021 CLINICAL DATA:  Shortness of breath and dizziness EXAM: CT CHEST, ABDOMEN AND PELVIS WITHOUT CONTRAST TECHNIQUE: Multidetector CT imaging of the chest, abdomen and pelvis was performed following the standard protocol without IV contrast. RADIATION DOSE REDUCTION: This exam was performed according to the departmental dose-optimization program which includes  automated exposure control, adjustment of the mA and/or kV according to patient size and/or use of iterative reconstruction technique. COMPARISON:  CT abdomen and pelvis dated March 30, 2018; chest CT dated Aug 28, 2021 FINDINGS: CT CHEST FINDINGS Cardiovascular: Normal heart size. Trace pericardial fluid. Moderate coronary artery calcifications of the LAD and  circumflex. Moderate atherosclerotic disease of the thoracic aorta. Mediastinum/Nodes: Small hiatal hernia. Thyroid is unremarkable. No pathologically enlarged lymph nodes seen in the chest. Lungs/Pleura: Central airways are patent. Severe centrilobular emphysema. No consolidation, pleural effusion or pneumothorax. Small bilateral solid pulmonary nodules, unchanged in size when compared with prior and favored to be benign. Musculoskeletal: Unchanged mild wedge compression deformities of the thoracic spine. No aggressive appearing osseous lesions. CT ABDOMEN PELVIS FINDINGS Hepatobiliary: No focal liver abnormality is seen. Status post cholecystectomy. No biliary dilatation. Pancreas: Unremarkable. No pancreatic ductal dilatation or surrounding inflammatory changes. Spleen: Normal in size without focal abnormality. 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 No hydronephrosis or nephrolithiasis. Bladder is unremarkable. Stomach/Bowel: Stomach is within normal limits. Mild diverticulosis. No evidence of bowel wall thickening, distention, or inflammatory changes. Vascular/Lymphatic: Aortic atherosclerosis. No enlarged abdominal or pelvic lymph nodes. Reproductive: No adnexal masses. Other: No abdominal wall hernia or abnormality. No abdominopelvic ascites. Musculoskeletal: No acute or significant osseous findings. IMPRESSION: 1. No acute findings in the chest, abdomen or pelvis, including no evidence of pneumonia. 2. Coronary artery calcifications, aortic Atherosclerosis (ICD10-I70.0) and Emphysema (ICD10-J43.9). Electronically Signed   By: Yetta Glassman M.D.   On: 09/19/2021 15:19   DG Chest Port 1 View  Result Date: 09/19/2021 CLINICAL DATA:  dyspnea EXAM: PORTABLE CHEST 1 VIEW COMPARISON:  June 13, 23. FINDINGS: No consolidation. No visible pleural effusions or pneumothorax. Hyperinflation,  compatible with emphysema. Redemonstrated generator type device projects over left upper chest. IMPRESSION: 1. No evidence of acute cardiopulmonary disease. 2. Emphysema. Electronically Signed   By: Margaretha Sheffield M.D.   On: 09/19/2021 11:05   CT HEAD WO CONTRAST (5MM)  Result Date: 09/17/2021 CLINICAL DATA:  Dizziness EXAM: CT HEAD WITHOUT CONTRAST TECHNIQUE: Contiguous axial images were obtained from the base of the skull through the vertex without intravenous contrast. RADIATION DOSE REDUCTION: This exam was performed according to the departmental dose-optimization program which includes automated exposure control, adjustment of the mA and/or kV according to patient size and/or use of iterative reconstruction technique. COMPARISON:  08/26/2021 FINDINGS: Brain: No acute intracranial findings are seen in noncontrast CT brain. There is interval resolution of small foci of subarachnoid hemorrhage adjacent to the falx. There are no signs of recent bleeding within the cranium. Cortical sulci are prominent. Vascular: Unremarkable. Skull: Unremarkable. Sinuses/Orbits: Unremarkable. Other: None. IMPRESSION: No acute intracranial findings are seen in noncontrast CT brain. There is interval resolution of small foci of subarachnoid hemorrhage adjacent to the falx since 08/26/2021. atrophy. Electronically Signed   By: Elmer Picker M.D.   On: 09/17/2021 13:20   DG Chest Port 1 View  Result Date: 09/17/2021 CLINICAL DATA:  66 year old female with shortness of breath, respiratory distress. EXAM: PORTABLE CHEST 1 VIEW COMPARISON:  Portable chest 08/26/2021 and earlier. Chest CT 08/28/2021 and earlier. FINDINGS: Centrilobular emphysema better demonstrated by CT last month. Portable AP semi upright view at 0451 hours. Stable lung volumes and mediastinal contours. No pneumothorax, pulmonary edema, pleural effusion, or confluent pulmonary opacity. 5 cm generator type device newly projects over the left upper chest,  but no connected leads are identified. EKG leads elsewhere. Visualized tracheal air column is within normal limits. No acute osseous abnormality identified. IMPRESSION: Emphysema.  No acute cardiopulmonary abnormality. Electronically Signed   By: Genevie Ann M.D.   On: 09/17/2021 05:39   CT CHEST WO CONTRAST  Result Date: 08/29/2021 CLINICAL DATA:  Abnormal xray - lung nodule, >= 1 cm Right upper lobe opacity on CXR EXAM: CT CHEST WITHOUT CONTRAST TECHNIQUE: Multidetector CT imaging of the chest was performed following the standard protocol without IV contrast. RADIATION DOSE REDUCTION: This exam was performed according to the departmental dose-optimization program which includes automated exposure control, adjustment of the mA and/or kV according to patient size and/or use of iterative reconstruction technique. COMPARISON:  05/08/2021, 01/31/2020 FINDINGS: Cardiovascular: Moderate coronary artery calcification. Global cardiac size within normal limits. No pericardial effusion. Central pulmonary arteries are of normal caliber. Mild atherosclerotic calcification within the thoracic aorta. No aortic aneurysm. Mediastinum/Nodes: No enlarged mediastinal or axillary lymph nodes. Thyroid gland, trachea, and esophagus demonstrate no significant findings. Lungs/Pleura: Severe emphysema. No suspicious or indeterminate focal pulmonary nodules. Chronic volume loss within the right middle lobe. No confluent pulmonary infiltrates. No pneumothorax or pleural effusion. No central obstructing lesion. Upper Abdomen: Status post cholecystectomy.  No acute abnormality. Musculoskeletal: No acute bone abnormality. No lytic or blastic bone lesion. IMPRESSION: No suspicious or indeterminate focal pulmonary nodules. No acute intrathoracic pathology identified. Severe emphysema. Moderate coronary artery calcification. Aortic Atherosclerosis (ICD10-I70.0) and Emphysema (ICD10-J43.9). Electronically Signed   By: Fidela Salisbury M.D.   On:  08/29/2021 00:23   ECHOCARDIOGRAM COMPLETE  Result Date: 08/26/2021    ECHOCARDIOGRAM REPORT   Patient Name:   NIOMA MCCUBBINS Date of Exam: 08/26/2021 Medical Rec #:  [id number]       Height:       61.0 in Accession #:    [id number]      Weight:       144.0 lb Date of Birth:  1955-12-03      BSA:          1.643 m Patient Age:    20 years        BP:           105/69 mmHg Patient Gender: F               HR:           91 bpm. Exam Location:  Inpatient Procedure: 2D Echo, Cardiac Doppler, Color Doppler and Intracardiac            Opacification Agent Indications:    Elevated troponin  History:        Patient has prior history of Echocardiogram examinations, most                 recent 12/19/2020. COPD.  Sonographer:    Joette Catching RCS Referring Phys: [phone] RONDELL A SMITH  Sonographer Comments: Technically difficult study due to poor echo windows. Image acquisition challenging due to COPD. IMPRESSIONS  1. Left ventricular ejection fraction, by estimation, is 35 to 40%. The left ventricle has moderately decreased function. The left ventricle demonstrates regional wall motion abnormalities (see scoring diagram/findings for description). There is hypokinesis of all mid LV segments although appears slightly worse in the mid septal LV segments. The apical LV segments appear hyperkinetic. 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 Clinical correlation recommended.  2. Left ventricular diastolic parameters are consistent with Grade I  diastolic dysfunction (impaired relaxation).  3. Right ventricular systolic function is normal. The right ventricular size is normal.  4. The mitral valve is normal in structure. Trivial mitral valve regurgitation.  5. The aortic valve was not well visualized. Aortic valve regurgitation is mild. Aortic valve sclerosis/calcification is present, without any evidence of aortic stenosis.  6. The inferior vena cava is normal in  size with greater than 50% respiratory variability, suggesting right atrial pressure of 3 mmHg. Comparison(s): Compared to prior TTE in 11/2020, there is now hyperkinesis of the LV apical segments (previously akinetic) and hypokinesis of the mid LV segments. EF now 35-40% (previously 30-35%). 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 FINDINGS  Left Ventricle: There is hypokinesis of all mid LV segments although appears slightly worse in the mid septal LV segments. The apical LV segments appear hyperkinetic. 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 Clinical correlation recommended. Left ventricular ejection fraction, by estimation, is 35 to 40%. The left ventricle has moderately decreased function. The left ventricle demonstrates regional wall motion abnormalities. Definity  contrast agent was given IV to delineate the left ventricular endocardial borders. The left ventricular internal cavity size was normal in size. There is no left ventricular hypertrophy. Left ventricular diastolic parameters are consistent with Grade I diastolic dysfunction (impaired relaxation). Right Ventricle: The right ventricular size is normal. No increase in right ventricular wall thickness. Right ventricular systolic function is normal. Left Atrium: Left atrial size was normal in size. Right Atrium: Right atrial size was normal in size. Pericardium: There is no evidence of pericardial effusion. Mitral Valve: The mitral valve is normal in structure. Trivial mitral valve regurgitation. Tricuspid Valve: The tricuspid valve is normal in structure. Tricuspid valve regurgitation is not demonstrated. Aortic Valve: The aortic valve was not well visualized. Aortic valve regurgitation is mild. Aortic regurgitation PHT measures 275 msec. Aortic valve sclerosis/calcification is present, without any evidence of aortic stenosis. Aortic valve mean gradient  measures 4.5 mmHg. Aortic valve peak gradient measures 8.7 mmHg. Aortic valve area, by VTI measures 1.99 cm. Pulmonic Valve: The pulmonic valve was not well visualized. Aorta: The aortic root and ascending aorta are structurally normal, with no evidence of dilitation. Venous: The inferior vena cava is normal in size with greater than 50% respiratory variability, suggesting right atrial pressure of 3 mmHg. IAS/Shunts: The atrial septum is grossly normal.  LEFT VENTRICLE PLAX 2D LVIDd:         4.80 cm      Diastology LVIDs:         3.70 cm      LV e' medial:    6.31 cm/s LV PW:         1.00 cm      LV E/e' medial:  11.4 LV IVS:        0.90 cm      LV e' lateral:   5.33 cm/s LVOT diam:     1.90 cm      LV E/e' lateral: 13.5 LV SV:         52 LV SV Index:   32 LVOT Area:     2.84 cm  LV Volumes (MOD) LV vol d, MOD A2C: 120.0 ml LV vol d, MOD A4C: 114.0 ml LV vol s, MOD A2C: 83.0 ml LV vol s, MOD A4C: 66.6 ml LV SV MOD A2C:     37.0 ml LV SV MOD A4C:     114.0 ml  LV SV MOD BP:      46.3 ml RIGHT VENTRICLE RV S prime:     12.80 cm/s TAPSE (M-mode): 2.2 cm LEFT ATRIUM             Index LA diam:        2.70 cm 1.64 cm/m LA Vol (A2C):   22.3 ml 13.58 ml/m LA Vol (A4C):   12.0 ml 7.31 ml/m LA Biplane Vol: 17.8 ml 10.84 ml/m  AORTIC VALVE                     PULMONIC VALVE AV Area (Vmax):    2.06 cm      PV Vmax:       1.26 m/s AV Area (Vmean):   2.06 cm      PV Peak grad:  6.4 mmHg AV Area (VTI):     1.99 cm AV Vmax:           147.50 cm/s AV Vmean:          103.800 cm/s AV VTI:            0.263 m AV Peak Grad:      8.7 mmHg AV Mean Grad:      4.5 mmHg LVOT Vmax:         107.00 cm/s LVOT Vmean:        75.400 cm/s LVOT VTI:          0.185 m LVOT/AV VTI ratio: 0.70 AI PHT:            275 msec  AORTA Ao Root diam: 2.80 cm Ao Asc diam:  2.60 cm MITRAL VALVE                TRICUSPID VALVE MV Area (PHT): 8.92 cm     TR Peak grad:   26.0 mmHg MV Decel Time: 85 msec      TR Vmax:        255.00 cm/s MV E velocity: 72.20 cm/s  MV A velocity: 103.00 cm/s  SHUNTS MV E/A ratio:  0.70         Systemic VTI:  0.18 m                             Systemic Diam: 1.90 cm Gwyndolyn Kaufman MD Electronically signed by Gwyndolyn Kaufman MD Signature Date/Time: 08/26/2021/4:08:17 PM    Final    MR BRAIN WO CONTRAST  Result Date: 08/26/2021 CLINICAL DATA:  [place]ipl[place] EXAM: MRI HEAD WITHOUT CONTRAST TECHNIQUE: Multiplanar, multiecho pulse sequences of the brain and surrounding structures were obtained without intravenous contrast. COMPARISON:  CT 08/26/2021, MRI 05/12/2021 FINDINGS: Brain: There is abnormal diffusion hyperintensity with corresponding susceptibility the parasagittal sulci along the falx reflecting subarachnoid hemorrhage seen on the prior CT. No acute infarction. Ventricles and sulci are stable in size and configuration. Patchy foci of T2 hyperintensity in the supratentorial white matter are nonspecific but may reflect stable chronic microvascular ischemic changes. No mass or mass effect. There is no extra-axial collection. Vascular: Major vessel flow voids at the skull base are preserved. Skull and upper cervical spine: Normal marrow signal is preserved. Sinuses/Orbits: Paranasal sinuses are aerated. Bilateral lens replacements. Other: Sella is unremarkable.  Mastoid air cells are clear. IMPRESSION: Abnormal signal along the falx corresponding to subarachnoid hemorrhage on CT. Otherwise no significant change since prior study. Electronically Signed   By: Macy Mis M.D.   On: 08/26/2021 15:24  DG Chest Port 1 View  Result Date: 08/26/2021 CLINICAL DATA:  Shortness of breath and chest pain. EXAM: PORTABLE CHEST 1 VIEW COMPARISON:  Portable chest 06/29/2021. FINDINGS: Heart size and vascular pattern are normal. There is aortic atherosclerosis in the transverse segment, unremarkable mediastinal configuration. No vascular congestion is seen. The lungs emphysematous. There is an irregular 2 x 1.1 cm opacity developed in the right  upper lobe mid perihilar area. The remaining lungs clear. No pleural collection is seen. There is thoracic spondylosis. IMPRESSION: 2 x 1.1 cm irregular opacity developed, right upper lobe mid perihilar area. The last chest CT was 05/08/2021 and demonstrated perifissural scar-like thickening in the right upper lung field extending along the major fissure. I suspect this is probably being seen in summation given the apical lordotic positioning on this study. A small infiltrate or less likely mass are both possible in a high-risk patient. Chest CT could rule out mass if needed or a short interval follow-up study could be considered to see if this persists. Electronically Signed   By: Telford Nab M.D.   On: 08/26/2021 06:10   DG Lumbar Spine Complete  Result Date: 08/26/2021 CLINICAL DATA:  Pain. EXAM: LUMBAR SPINE - COMPLETE 4+ VIEW COMPARISON:  None Available. FINDINGS: Bones are demineralized. No evidence for lumbar spine fracture or subluxation. Mild loss of disc height noted at L3-4, L4-5 common L5-S1. Mild bilateral facet degeneration noted in the lower lumbar spine. Atherosclerotic calcification characterizes the abdominal aorta. IMPRESSION: Degenerative changes in the lower lumbar spine without acute bony abnormality. Aortic Atherosclerois (ICD10-170.0) Electronically Signed   By: Misty Stanley M.D.   On: 08/26/2021 06:01   DG Thoracic Spine 2 View  Result Date: 08/26/2021 CLINICAL DATA:  Pain. EXAM: THORACIC SPINE 2 VIEWS COMPARISON:  None Available. FINDINGS: Two-view exam is limited by osteopenia. Loss of vertebral body height noted at 2 levels in the midthoracic spine likely T6 and T7, proximally 25% loss of height anteriorly at both levels. Features are age indeterminate by x-ray. No abnormal paraspinal line on the frontal projection. Possible sclerosis involving the right pedicle of the T7 vertebral body on the frontal projection. IMPRESSION: Loss of vertebral body height at 2 levels in the  midthoracic spine, likely T6 and T7, age indeterminate by x-ray. Possible sclerosis of the right T7 pedicle. MRI thoracic spine could be used to further evaluate as clinically warranted. Electronically Signed   By: Misty Stanley M.D.   On: 08/26/2021 05:59   CT Head Wo Contrast  Addendum Date: 08/26/2021   ADDENDUM REPORT: 08/26/2021 05:21 ADDENDUM: Study discussed by telephone with Dr. Ripley Fraise on 08/26/2021 at 0518 hours. Electronically Signed   By: Genevie Ann M.D.   On: 08/26/2021 05:21   Result Date: 08/26/2021 CLINICAL DATA:  66 year old female with dizziness, hypertensive (systolic 509T). Fall 3 days ago. Diaphoretic. EXAM: CT HEAD WITHOUT CONTRAST TECHNIQUE: Contiguous axial images were obtained from the base of the skull through the vertex without intravenous contrast. RADIATION DOSE REDUCTION: This exam was performed according to the departmental dose-optimization program which includes automated exposure control, adjustment of the mA and/or kV according to patient size and/or use of iterative reconstruction technique. COMPARISON:  Brain MRI 05/12/2021.  Paranasal sinus CT 10/28/2016. FINDINGS: Brain: Small volume hyperdense blood along the interhemispheric fissure, left greater than right (series 3, images 24 and 27), most likely in the subarachnoid space. No dural thickening or nodularity of the falx on February MRI. No IVH or ventriculomegaly. Basilar cisterns appear to  remain normal. Fetal type PCA origins again noted. No blood along the tentorium or elsewhere. No superimposed midline shift, mass effect, evidence of mass lesion, or evidence of cortically based acute infarction. Gray-white matter differentiation is within normal limits throughout the brain. Vascular: Calcified atherosclerosis at the skull base. No suspicious intracranial vascular hyperdensity. Skull: No fracture identified. Sinuses/Orbits: Visualized paranasal sinuses and mastoids are clear. Other: No acute orbit or scalp soft  tissue injury identified. IMPRESSION: 1. Positive for trace Subarachnoid Hemorrhage along the interhemispheric fissure. Favor sequelae of trauma given pattern of blood and recent fall. 2. No skull fracture or other acute traumatic injury to the brain identified. 3. Otherwise negative for age non contrast CT appearance of the brain. Electronically Signed: By: Genevie Ann M.D. On: 08/26/2021 05:15   CT Cervical Spine Wo Contrast  Result Date: 08/26/2021 CLINICAL DATA:  66 year old female with dizziness, hypertensive (systolic 737T). Fall 3 days ago. Diaphoretic. EXAM: CT CERVICAL SPINE WITHOUT CONTRAST TECHNIQUE: Multidetector CT imaging of the cervical spine was performed without intravenous contrast. Multiplanar CT image reconstructions were also generated. RADIATION DOSE REDUCTION: This exam was performed according to the departmental dose-optimization program which includes automated exposure control, adjustment of the mA and/or kV according to patient size and/or use of iterative reconstruction technique. COMPARISON:  Head CT today reported separately. FINDINGS: Alignment: Straightening and mild reversal of cervical lordosis. Cervicothoracic junction alignment is within normal limits. Bilateral posterior element alignment is within normal limits. Skull base and vertebrae: Visualized skull base is intact. No atlanto-occipital dissociation. C1 and C2 appear intact and aligned. Intermittent mild motion artifact. No acute osseous abnormality identified. Soft tissues and spinal canal: No prevertebral fluid or swelling. No visible canal hematoma. Partially retropharyngeal course of both carotids in the neck (normal variant). Otherwise negative visible noncontrast neck soft tissues. Disc levels: Chronic cervical spine disc and endplate degeneration. No convincing cervical spinal stenosis. Upper chest: Osteopenia. Grossly intact visible upper thoracic levels. Emphysema. Retained secretions in the trachea at the thoracic  inlet. IMPRESSION: 1. No acute traumatic injury identified in the cervical spine. 2. Emphysema (ICD10-J43.9). Retained secretions in the trachea at the thoracic inlet, Aspiration not excluded. Electronically Signed   By: Genevie Ann M.D.   On: 08/26/2021 05:19    Microbiology: Results for orders placed or performed during the hospital encounter of 09/17/21  Resp Panel by RT-PCR (Flu A&B, Covid) Anterior Nasal Swab     Status: None   Collection Time: 09/17/21  4:45 AM   Specimen: Anterior Nasal Swab  Result Value Ref Range Status   SARS Coronavirus 2 by RT PCR NEGATIVE NEGATIVE Final    Comment: (NOTE) SARS-CoV-2 target nucleic acids are NOT DETECTED.  The SARS-CoV-2 RNA is generally detectable in upper respiratory specimens during the acute phase of infection. The lowest concentration of SARS-CoV-2 viral copies this assay can detect is 138 copies/mL. A negative result does not preclude SARS-Cov-2 infection and should not be used as the sole basis for treatment or other patient management decisions. A negative result may occur with  improper specimen collection/handling, submission of specimen other than nasopharyngeal swab, presence of viral mutation(s) within the areas targeted by this assay, and inadequate number of viral copies(<138 copies/mL). A negative result must be combined with clinical observations, patient history, and epidemiological information. The expected result is Negative.  Fact Sheet for Patients:  EntrepreneurPulse.com.au  Fact Sheet for Healthcare Providers:  IncredibleEmployment.be  This test is no t yet approved or cleared by the [place] FDA  and  has been authorized for detection and/or diagnosis of SARS-CoV-2 by FDA under an Emergency Use Authorization (EUA). This EUA will remain  in effect (meaning this test can be used) for the duration of the COVID-19 declaration under Section 564(b)(1) of the Act, 21 U.S.C.section  360bbb-3(b)(1), unless the authorization is terminated  or revoked sooner.       Influenza A by PCR NEGATIVE NEGATIVE Final   Influenza B by PCR NEGATIVE NEGATIVE Final    Comment: (NOTE) The Xpert Xpress SARS-CoV-2/FLU/RSV plus assay is intended as an aid in the diagnosis of influenza from Nasopharyngeal swab specimens and should not be used as a sole basis for treatment. Nasal washings and aspirates are unacceptable for Xpert Xpress SARS-CoV-2/FLU/RSV testing.  Fact Sheet for Patients: EntrepreneurPulse.com.au  Fact Sheet for Healthcare Providers: IncredibleEmployment.be  This test is not yet approved or cleared by the [place] FDA and has been authorized for detection and/or diagnosis of SARS-CoV-2 by FDA under an Emergency Use Authorization (EUA). This EUA will remain in effect (meaning this test can be used) for the duration of the COVID-19 declaration under Section 564(b)(1) of the Act, 21 U.S.C. section 360bbb-3(b)(1), unless the authorization is terminated or revoked.  Performed at [facility], [place] [address], [place], [place] [address]     Labs: CBC: Recent Labs  Lab 09/18/21 0109 09/19/21 1044 09/19/21 1126 09/20/21 0318  WBC 15.0* 16.5*  --  13.3*  HGB 10.6* 11.0* 10.9* 11.0*  HCT 33.1* 35.9* 32.0* 34.1*  MCV 97.9 101.4*  --  96.6  PLT 252 278  --  659   Basic Metabolic Panel: Recent Labs  Lab 09/18/21 0109 09/19/21 1044 09/19/21 1126 09/20/21 0318 09/22/21 0329 09/23/21 0425  NA 140 144 143 137 141 140  K 4.4 3.8 3.8 4.3 5.4* 4.6  CL 106 110  --  98 98 99  CO2 24 25  --  25 33* 30  GLUCOSE 142* 86  --  205* 123* 94  BUN 19 27*  --  28* 32* 38*  CREATININE 0.78 1.09*  --  1.02* 1.05* 1.07*  CALCIUM 8.6* 8.7*  --  8.8* 9.8 9.8   Liver Function Tests: Recent Labs  Lab 09/19/21 1044  AST 20  ALT 17  ALKPHOS 70  BILITOT 0.4  PROT 5.7*  ALBUMIN 3.5   CBG: No results for input(s): "GLUCAP"  in the last 168 hours.  Discharge time spent: greater than 30 minutes.  Signed: Tawni Millers, MD [facility] 09/24/2021

## 2021-09-24 NOTE — Progress Notes (Signed)
Mobility Specialist Progress Note:   09/24/21 1125  Mobility  Activity Ambulated with assistance in hallway  Level of Assistance Standby assist, set-up cues, supervision of patient - no hands on  Assistive Device Four wheel walker  Distance Ambulated (ft) 250 ft  Activity Response Tolerated well  $Mobility charge 1 Mobility   Pt received in bed willing to participate in mobility. No complaints of pain. Left EOB with call bell in reach and all needs met.   [facility] Jemma Rasp Mobility Specialist

## 2021-09-25 ENCOUNTER — Encounter: Payer: Self-pay | Admitting: Physical Therapy

## 2021-09-25 ENCOUNTER — Telehealth: Payer: Self-pay | Admitting: Cardiovascular Disease

## 2021-09-25 ENCOUNTER — Telehealth: Payer: Self-pay

## 2021-09-25 DIAGNOSIS — I472 Ventricular tachycardia, unspecified: Secondary | ICD-10-CM

## 2021-09-25 NOTE — Telephone Encounter (Signed)
Transition Care Management Follow-up Telephone Call Date of discharge and from where: [facility] 09-24-21 Dx: COPD exac  How have you been since you were released from the hospital? Doing 100% better  Any questions or concerns? No  Items Reviewed: Did the pt receive and understand the discharge instructions provided? Yes  Medications obtained and verified? Yes  Other? No  Any new allergies since your discharge? No  Dietary orders reviewed? Yes Do you have support at home? Yes   Home Care and Equipment/Supplies: Were home health services ordered? no If so, what is the name of the agency? na  Has the agency set up a time to come to the patient's home? not applicable Were any new equipment or medical supplies ordered?  Yes: [facility] What is the name of the medical supply agency? [employer] [employer]  Were you able to get the supplies/equipment? yes Do you have any questions related to the use of the equipment or supplies? No  Functional Questionnaire: (I = Independent and D = Dependent) ADLs: I  Bathing/Dressing- I  Meal Prep- I  Eating- I  Maintaining continence- I  Transferring/Ambulation- I  Managing Meds- I  Follow up appointments reviewed:  PCP Hospital f/u appt confirmed? Yes  Scheduled to see [name] NP on 09-27-21 @ [facility] f/u appt confirmed? Yes  Scheduled to see Dr Ann Maki  on 10-10-21 @ 310pm. Are transportation arrangements needed? No  If their condition worsens, is the pt aware to call PCP or go to the Emergency Dept.? Yes Was the patient provided with contact information for the PCP's office or ED? Yes Was to pt encouraged to call back with questions or concerns? Yes

## 2021-09-25 NOTE — Telephone Encounter (Signed)
[place] from [employer] calling for critical results on pt

## 2021-09-25 NOTE — Telephone Encounter (Signed)
Called patient and let her know Dr. Angelena Form would like for patient to see EP this week. Ordered referral for EP. Sent message to EP scheduler to help get patient in for appointment.

## 2021-09-25 NOTE — Telephone Encounter (Signed)
Spoke with [place] from [facility] who is calling to report that the patient had a run of Vtach at a rate of 232 for more than 30 seconds. This lasted for over 100 beats. Can be seen on strip 6.

## 2021-09-27 ENCOUNTER — Inpatient Hospital Stay: Payer: Medicare Other | Admitting: Adult Health

## 2021-09-27 NOTE — Progress Notes (Deleted)
   Subjective:    Patient ID: Kim [name], female    DOB: 1956/03/26, 66 y.o.   MRN: [id number]  HPI 66 year old female who  has a past medical history of C8 RADICULOPATHY (06/05/2009), COPD (08/31/2007), DEPRESSION (11/19/2006), DYSLIPIDEMIA (03/12/2009), MI (mitral incompetence), NEPHROLITHIASIS (01/05/2008), OSTEOARTHRITIS (06/05/2009), PARESTHESIA (08/24/2007), and TOBACCO ABUSE (01/25/2010).  She presents to the office today for TCM visit   Admit Date 09/19/2021 Discharge Date 09/24/2021  She also had a hospital admission from 6 13-6 14 for COPD exacerbation.  She returned to the hospital when she noted weight gain of about 10 pounds at home.  Her blood pressure was elevated and she called EMS.  On initial physical exam her blood pressure was 117/60, heart rate 79, respirations 20, and O2 saturation 100% on oxygen, her lungs had wheezing.  Cardiac rate and rhythm were within normal limits, abdomen not distended, no lower extremity edema.  X-ray showed hyperinflation with left lower lobe atelectasis.  EKG showed sinus rhythm with no ST T-segment or T wave changes.  Hospital Course  COPD with acute exacerbation -Is placed on aggressive bronchodilator therapy with albuterol and imprtropium, Dulera and daily cleaning techniques with incentive spirometer and flutter valve. -She was given systemic corticosteroids with methylprednisolone.  Her symptoms improved with improvement in oxygenation and at time of discharge her oxygenation was 100% on 2 L via nasal cannula. -Discharge on a slow prednisone taper down to her maintenance dose of 10 mg and advised to follow-up with pulmonary  Syncope and collapse\-  -Zio patch was placed after she had a syncopal episode in May resulting in a subdural hematoma.  Chronic combined systolic and diastolic heart failure -Short course of IV furosemide with improvement in volume status and symptoms.  At discharge she was continued on losartan and bisoprolol for  blood pressure control and continue with as needed furosemide for signs of volume overload. -Her echocardiogram showed preserved LV systolic function with a EF of 60 to 78%, RV systolic function preserved, no significant valvular disease -Follow-up with cardiology outpatient  Anxiety and depression Stable overall.  She was continued on Xanax and Paxil  Essential hypertension -Continue home blood pressure medications  Hyperkalemia  -Electrolytes were corrected at time of discharge her renal function remained stable with serum creatinine of 1.0 and a K of 4.6 with serum bicarbonate 30. -Continue to take furosemide as needed and follow-up renal function as outpatient   Review of Systems     Objective:   Physical Exam        Assessment & Plan:

## 2021-09-29 ENCOUNTER — Encounter (HOSPITAL_COMMUNITY): Payer: Self-pay | Admitting: *Deleted

## 2021-09-29 ENCOUNTER — Emergency Department (HOSPITAL_COMMUNITY): Payer: Medicare Other

## 2021-09-29 ENCOUNTER — Other Ambulatory Visit: Payer: Self-pay

## 2021-09-29 ENCOUNTER — Emergency Department (HOSPITAL_COMMUNITY)
Admission: EM | Admit: 2021-09-29 | Discharge: 2021-09-29 | Disposition: A | Discharge status: Home / Self Care | Payer: Medicare Other | Attending: Emergency Medicine | Admitting: Emergency Medicine

## 2021-09-29 DIAGNOSIS — I509 Heart failure, unspecified: Secondary | ICD-10-CM | POA: Diagnosis not present

## 2021-09-29 DIAGNOSIS — R0602 Shortness of breath: Secondary | ICD-10-CM | POA: Insufficient documentation

## 2021-09-29 DIAGNOSIS — Z20822 Contact with and (suspected) exposure to covid-19: Secondary | ICD-10-CM | POA: Insufficient documentation

## 2021-09-29 DIAGNOSIS — J449 Chronic obstructive pulmonary disease, unspecified: Secondary | ICD-10-CM | POA: Insufficient documentation

## 2021-09-29 DIAGNOSIS — Z91040 Latex allergy status: Secondary | ICD-10-CM | POA: Insufficient documentation

## 2021-09-29 DIAGNOSIS — Z7982 Long term (current) use of aspirin: Secondary | ICD-10-CM | POA: Diagnosis not present

## 2021-09-29 LAB — COMPREHENSIVE METABOLIC PANEL
ALT: 18 U/L (ref 0–44)
AST: 20 U/L (ref 15–41)
Albumin: 3.3 g/dL — ABNORMAL LOW (ref 3.5–5.0)
Alkaline Phosphatase: 50 U/L (ref 38–126)
Anion gap: 9 (ref 5–15)
BUN: 25 mg/dL — ABNORMAL HIGH (ref 8–23)
CO2: 26 mmol/L (ref 22–32)
Calcium: 8.6 mg/dL — ABNORMAL LOW (ref 8.9–10.3)
Chloride: 105 mmol/L (ref 98–111)
Creatinine, Ser: 1 mg/dL (ref 0.44–1.00)
GFR, Estimated: >60 mL/min (ref >60)
Glucose, Bld: 102 mg/dL — ABNORMAL HIGH (ref 70–99)
Potassium: 3.7 mmol/L (ref 3.5–5.1)
Sodium: 140 mmol/L (ref 135–145)
Total Bilirubin: 0.4 mg/dL (ref 0.3–1.2)
Total Protein: 5.5 g/dL — ABNORMAL LOW (ref 6.5–8.1)

## 2021-09-29 LAB — I-STAT VENOUS BLOOD GAS, ED
Acid-Base Excess: 4 mmol/L — ABNORMAL HIGH (ref 0.0–2.0)
Bicarbonate: 28.5 mmol/L — ABNORMAL HIGH (ref 20.0–28.0)
Calcium, Ion: 1.1 mmol/L — ABNORMAL LOW (ref 1.15–1.40)
HCT: 34 % — ABNORMAL LOW (ref 36.0–46.0)
Hemoglobin: 11.6 g/dL — ABNORMAL LOW (ref 12.0–15.0)
O2 Saturation: 96 %
Potassium: 3.7 mmol/L (ref 3.5–5.1)
Sodium: 139 mmol/L (ref 135–145)
TCO2: 30 mmol/L (ref 22–32)
pCO2, Ven: 42.6 mmHg — ABNORMAL LOW (ref 44–60)
pH, Ven: 7.433 — ABNORMAL HIGH (ref 7.25–7.43)
pO2, Ven: 80 mmHg — ABNORMAL HIGH (ref 32–45)

## 2021-09-29 LAB — CBC WITH DIFFERENTIAL/PLATELET
Abs Immature Granulocytes: 0.34 10*3/uL — ABNORMAL HIGH (ref 0.00–0.07)
Basophils Absolute: 0.1 10*3/uL (ref 0.0–0.1)
Basophils Relative: 1 %
Eosinophils Absolute: 0.1 10*3/uL (ref 0.0–0.5)
Eosinophils Relative: 1 %
HCT: 36.8 % (ref 36.0–46.0)
Hemoglobin: 11.7 g/dL — ABNORMAL LOW (ref 12.0–15.0)
Immature Granulocytes: 2 %
Lymphocytes Relative: 31 %
Lymphs Abs: 7.2 10*3/uL — ABNORMAL HIGH (ref 0.7–4.0)
MCH: 31.6 pg (ref 26.0–34.0)
MCHC: 31.8 g/dL (ref 30.0–36.0)
MCV: 99.5 fL (ref 80.0–100.0)
Monocytes Absolute: 1.2 10*3/uL — ABNORMAL HIGH (ref 0.1–1.0)
Monocytes Relative: 5 %
Neutro Abs: 14 10*3/uL — ABNORMAL HIGH (ref 1.7–7.7)
Neutrophils Relative %: 60 %
Platelets: 334 10*3/uL (ref 150–400)
RBC: 3.7 MIL/uL — ABNORMAL LOW (ref 3.87–5.11)
RDW: 15.1 % (ref 11.5–15.5)
WBC: 23 10*3/uL — ABNORMAL HIGH (ref 4.0–10.5)
nRBC: 0 % (ref 0.0–0.2)

## 2021-09-29 LAB — BRAIN NATRIURETIC PEPTIDE: B Natriuretic Peptide: 75.1 pg/mL (ref 0.0–100.0)

## 2021-09-29 LAB — TROPONIN I (HIGH SENSITIVITY)
Troponin I (High Sensitivity): 38 ng/L — ABNORMAL HIGH (ref <18)
Troponin I (High Sensitivity): 77 ng/L — ABNORMAL HIGH (ref <18)
Troponin I (High Sensitivity): 77 ng/L — ABNORMAL HIGH (ref <18)
Troponin I (High Sensitivity): 87 ng/L — ABNORMAL HIGH (ref <18)

## 2021-09-29 LAB — RESP PANEL BY RT-PCR (FLU A&B, COVID) ARPGX2
Influenza A by PCR: NEGATIVE
Influenza B by PCR: NEGATIVE
SARS Coronavirus 2 by RT PCR: NEGATIVE

## 2021-09-29 MED ORDER — ASPIRIN 81 MG PO CHEW
324.0000 mg | CHEWABLE_TABLET | Freq: Once | ORAL | Status: AC
  Administered 2021-09-29: 324 mg via ORAL
  Filled 2021-09-29: qty 4

## 2021-09-29 MED ORDER — ALBUTEROL SULFATE (2.5 MG/3ML) 0.083% IN NEBU
2.5000 mg | INHALATION_SOLUTION | Freq: Once | RESPIRATORY_TRACT | Status: AC
Start: 2021-09-29 — End: 2021-09-29
  Administered 2021-09-29: 2.5 mg via RESPIRATORY_TRACT
  Filled 2021-09-29: qty 3

## 2021-09-29 NOTE — ED Triage Notes (Signed)
Patient presents to ed via [facility] states she started having sob this am, husband states patient had been sitting out on the back porch where is is hot. When ems arrived patient was breathing 40 times a minute diaphoretic, placed on cpap and was given Albuterol 12.5, atrovent 1.5 Mag. 2 gm and 125 mg Solumedrol per ems. Upon arrival husband at bedside. Patient is  warm and dry, able to speak in complete sentences  RT at bedside. Placed patient on 2 liters [place] of which she uses at home.

## 2021-09-30 ENCOUNTER — Institutional Professional Consult (permissible substitution): Payer: Medicare Other | Admitting: Internal Medicine

## 2021-09-30 LAB — PATHOLOGIST SMEAR REVIEW

## 2021-10-03 ENCOUNTER — Telehealth: Payer: Medicare Other

## 2021-10-03 NOTE — Telephone Encounter (Signed)
Signed provider form for [facility] received. Placed in PAP pending info folder while we await patient portion. She has post-d/c f/w with PCP tomorrow, 10/04/21  Patient will need to be contacted in regards to her portion of application.  Knox Saliva, PharmD, MPH, BCPS, CPP Clinical Pharmacist (Rheumatology and Pulmonology)

## 2021-10-04 ENCOUNTER — Ambulatory Visit (INDEPENDENT_AMBULATORY_CARE_PROVIDER_SITE_OTHER): Payer: Medicare Other | Admitting: Adult Health

## 2021-10-04 ENCOUNTER — Encounter: Payer: Self-pay | Admitting: Adult Health

## 2021-10-04 VITALS — BP 138/70 | HR 75 | Temp 98.9°F | Ht 61.75 in | Wt 141.0 lb

## 2021-10-04 DIAGNOSIS — F419 Anxiety disorder, unspecified: Secondary | ICD-10-CM | POA: Diagnosis not present

## 2021-10-04 DIAGNOSIS — F32A Depression, unspecified: Secondary | ICD-10-CM

## 2021-10-04 DIAGNOSIS — I5032 Chronic diastolic (congestive) heart failure: Secondary | ICD-10-CM

## 2021-10-04 DIAGNOSIS — J441 Chronic obstructive pulmonary disease with (acute) exacerbation: Secondary | ICD-10-CM

## 2021-10-04 DIAGNOSIS — I10 Essential (primary) hypertension: Secondary | ICD-10-CM | POA: Diagnosis not present

## 2021-10-04 DIAGNOSIS — R42 Dizziness and giddiness: Secondary | ICD-10-CM

## 2021-10-04 LAB — COMPREHENSIVE METABOLIC PANEL
ALT: 16 U/L (ref 0–35)
AST: 15 U/L (ref 0–37)
Albumin: 4.1 g/dL (ref 3.5–5.2)
Alkaline Phosphatase: 51 U/L (ref 39–117)
BUN: 27 mg/dL — ABNORMAL HIGH (ref 6–23)
CO2: 30 mEq/L (ref 19–32)
Calcium: 9 mg/dL (ref 8.4–10.5)
Chloride: 104 mEq/L (ref 96–112)
Creatinine, Ser: 0.87 mg/dL (ref 0.40–1.20)
GFR: 68.82 mL/min (ref >60.00)
Glucose, Bld: 105 mg/dL — ABNORMAL HIGH (ref 70–99)
Potassium: 4.3 mEq/L (ref 3.5–5.1)
Sodium: 140 mEq/L (ref 135–145)
Total Bilirubin: 0.3 mg/dL (ref 0.2–1.2)
Total Protein: 6.2 g/dL (ref 6.0–8.3)

## 2021-10-04 LAB — TROPONIN I (HIGH SENSITIVITY): High Sens Troponin I: 12 ng/L (ref 2–17)

## 2021-10-04 LAB — BRAIN NATRIURETIC PEPTIDE: Pro B Natriuretic peptide (BNP): 76 pg/mL (ref 0.0–100.0)

## 2021-10-04 MED ORDER — BISOPROLOL FUMARATE 5 MG PO TABS
2.5000 mg | ORAL_TABLET | Freq: Every day | ORAL | 1 refills | Status: DC
Start: 2021-10-04 — End: 2021-10-14

## 2021-10-04 NOTE — Progress Notes (Signed)
Subjective:    Patient ID: Kim [name], female    DOB: 01-Jun-1955, 66 y.o.   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  She presents to the office today for TCM Visit   Admit Date 09/19/2021 Discharge Date 09/24/2021  She was admitted to the hospital on 09/19/2021 with COPD exacerbation and congestive heart failure exacerbation.  She also had recent hospitalizations on 6 13-6 14 for COPD exacerbation.  At home she noted weight gain about 10 pounds.  Her blood pressure was elevated and she called EMS.  On her initial physical exam her blood pressure was 117/60, heart rate 79, O2 saturation 100% on room 2 L via nasal cannula.  Chest x-ray showed hyperinflation with left lower lobe atelectasis.  EKG showed sinus rhythm with no significant ST segment or T wave changes.  In the emergency room she was given a short course of IV furosemide with improvement in her volume status.  Hospital Course  COPD with acute exacerbation  -Acute on chronic hypoxic and hypercapnic respiratory failure.  She was placed on aggressive bronchodilator therapy with albuterol, imprtropium, and dulera -She was given systemic corticosteroids with methylprednisolone.  Her symptoms improved with oxygenation and at time of discharge she was 100% on 2 L via nasal cannula. -She was discharged home with a slow prednisone taper down to her maintenance dose of 10 mg daily.  Chronic systolic and diastolic CHF. -She had a short course of IV furosemide with improvement in volume status and symptoms. -She was discharged at home with losartan and bisoprolol for blood pressure control and to continue as needed furosemide for signs of volume overload. -Echo showed  preserved LV systolic function with EF 60 to 56%, RV systolic function preserved.  No significant valvular disease.  Anxiety -Stable with Xanax and Paxil  Essential hypertension -Continue with bisoprolol and losartan  Hyperkalemia -Electrolytes were corrected  Today she is with her husband  Unfortunately she had to return to the ER five days ago shortness of breath.  After she woke up she started to experience shortness of breath.  She took her breathing treatment and Xanax at home.  When EMS arrived they noted limited air movement on lung auscultation and increased work of breathing on scene.  EMS placed her on CPAP, she received 4.5 mg of albuterol, 1.5 mg of Atrovent, 2 g of magnesium, 125 mg of Solu-Medrol.  Her blood pressures were initially elevated when she was having increased work of breathing.  Following CPAP administration the patient's blood pressures dropped to the 110s over 70s range which is her baseline.  Her symptoms improved upon arrival in the emergency room and she was discharged home instead of being admitted.  Today she reports that she is feeling better and feels as though she is back to her baseline.  She is found that in the morning after she gets out of bed she has increased shortness of breath which causes her anxiety to increase, which in turn increases her pulse and blood pressure.  She would like to have her troponin rechecked today, BMP, BNP and also needs a new referral to physical therapy for vertigo treatment.  She had to cancel her last 2 appointments because she was to the hospital.  Review of Systems  Constitutional: Negative.   Respiratory:  Positive  for shortness of breath and wheezing.   Cardiovascular: Negative.   Gastrointestinal: Negative.   Endocrine: Negative.   Genitourinary: Negative.   Musculoskeletal: Negative.   Allergic/Immunologic: Negative.   Neurological:  Positive for dizziness.  Hematological: Negative.   Psychiatric/Behavioral:   The patient is nervous/anxious.   All other systems reviewed and are negative.  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   OSTEOARTHRITIS 06/05/2009   PARESTHESIA 08/24/2007   TOBACCO ABUSE 01/25/2010    Social History   Socioeconomic History   Marital status: Married    Spouse name: [name]   Number of children: 2   Years of education: Not on file   Highest education level: Not on file  Occupational History   Occupation: Patient care aide-sits with elderly lady  Tobacco Use   Smoking status: Former    Packs/day: 2.00    Years: 38.00    Total pack years: 76.00    Types: Cigarettes    Quit date: 03/07/2014    Years since quitting: 7.5   Smokeless tobacco: Never  Vaping Use   Vaping Use: Never used  Substance and Sexual Activity   Alcohol use: No    Alcohol/week: 0.0 standard drinks of alcohol   Drug use: No   Sexual activity: Not on file  Other Topics Concern   Not on file  Social History Narrative   [facility] (01/12/17):   Originally from [facility]. Has lived in [place] for 17 years. Married. Has 2 dogs currently. No mold and only 1 indoor plant. Had her home professionally cleaned a month ago. No bird or hot tub exposure. 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    Right handed    Social Determinants of Health   Financial Resource Strain: High Risk (09/10/2021)   Overall Financial Resource Strain (CARDIA)    Difficulty of Paying Living Expenses: Very hard  Food Insecurity: No Food Insecurity (09/10/2021)   Hunger Vital Sign    Worried About Running Out of Food in the Last Year: Never true    Ran Out of Food in the Last Year: Never true  Transportation Needs: No Transportation Needs (09/10/2021)   PRAPARE - Hydrologist (Medical): No    Lack of Transportation  (Non-Medical): No  Physical Activity: Inactive (03/25/2021)   Exercise Vital Sign    Days of Exercise per Week: 0 days    Minutes of Exercise per Session: 0 min  Stress: No Stress Concern Present (09/10/2021)   [place]    Feeling of Stress : Not at all  Social Connections: Moderately Isolated (03/25/2021)   Social Connection and Isolation Panel [NHANES]    Frequency of Communication with Friends and Family: More than three times a week    Frequency of Social Gatherings with Friends and Family: More than three times a week    Attends Religious Services: Never    Marine scientist or Organizations: No    Attends Archivist Meetings: Never    Marital Status: Married  Human resources officer Violence: Not At Risk (03/25/2021)   Humiliation, Afraid, Rape, and Kick questionnaire    Fear of Current or Ex-Partner: No    Emotionally Abused: No    Physically Abused: No    Sexually Abused:  No    Past Surgical History:  Procedure Laterality Date   ABDOMINAL HYSTERECTOMY     APPENDECTOMY  1969   CARDIAC CATHETERIZATION N/A 07/05/2015   Procedure: Left Heart Cath and Coronary Angiography;  Surgeon: Leonie Man, MD;  Location: [facility];  Service: Cardiovascular;  Laterality: N/A;   CHOLECYSTECTOMY  1989   collapse lung  1990   COLONOSCOPY WITH PROPOFOL N/A 05/14/2018   Procedure: COLONOSCOPY WITH PROPOFOL;  Surgeon: Irene Shipper, MD;  Location: [facility];  Service: Endoscopy;  Laterality: N/A;   EXCISION MASS ABDOMINAL  2019   EXPLORATORY LAPAROTOMY  1969   LEFT HEART CATH AND CORONARY ANGIOGRAPHY N/A 11/19/2020   Procedure: LEFT HEART CATH AND CORONARY ANGIOGRAPHY;  Surgeon: [name], MD;  Location: [facility];  Service: Cardiovascular;  Laterality: N/A;   POLYPECTOMY  05/14/2018   Procedure: POLYPECTOMY;  Surgeon: Irene Shipper, MD;  Location: [facility];  Service: Endoscopy;;    SALPINGOOPHORECTOMY  2019    Family History  Problem Relation Age of Onset   Cancer Mother        lung   Heart attack Mother    Hypertension Sister    Multiple sclerosis Brother    Diabetes Sister    Rheum arthritis Sister    Thyroid disease Sister    Multiple sclerosis Other    Alcohol abuse Other    Arthritis Other    Diabetes Other    Kidney disease Other    Cancer Other        lung,ovarian,skin, uterine   Stroke Other    Heart disease Other    Melanoma Other    Osteoporosis Other    Heart attack Maternal Uncle    Heart attack Other        [place]   Heart attack Other        NEPHEW   Hypertension Brother    Stroke Maternal Grandmother    Colon cancer Neg Hx     Allergies  Allergen Reactions   Tape Other (See Comments)    Skin tears easily, peels skin off   Daliresp [Roflumilast]     Skin peeling   Latex Other (See Comments)    Peels skin off   Lisinopril Cough   Morphine And Related     hallucinations   Penicillins Hives and Other (See Comments)    Tolerates Omnicef Has patient had a PCN reaction causing immediate rash, facial/tongue/throat swelling, SOB or lightheadedness with hypotension: Yes Has patient had a PCN reaction causing severe rash involving mucus membranes or skin necrosis: No Has patient had a PCN reaction that required hospitalization No Has patient had a PCN reaction occurring within the last 10 years: No If all of the above answers are "NO", then may proceed with Cephalosporin use.    Omnicef [Cefdinir] Rash    Current Outpatient Medications on File Prior to Visit  Medication Sig Dispense Refill   acetaminophen (TYLENOL) 500 MG tablet Take 500 mg by mouth 2 (two) times daily as needed for headache.     albuterol (VENTOLIN HFA) 108 (90 Base) MCG/ACT inhaler INHALE TWO PUFFS INTO LUNGS EVERY 4 TO 6 HOURS AS NEEDED FOR SHORTNESS OF BREATH/WHEEZING. (Patient taking differently: Inhale 2 puffs into the lungs every 4 (four) hours as needed for  wheezing or shortness of breath.) 18 g 1   ALPRAZolam (XANAX) 0.5 MG tablet Take 1 tablet (0.5 mg total) by mouth 3 (three) times daily as needed for  anxiety. 90 tablet 1   aspirin EC 81 MG EC tablet Take 1 tablet (81 mg total) by mouth daily. (Patient taking differently: Take 81 mg by mouth 3 days.)     butalbital-acetaminophen-caffeine (FIORICET) 50-325-40 MG tablet Take 1 tablet by mouth every 6 (six) hours as needed for headache. 60 tablet 0   Calcium Carb-Cholecalciferol (CALCIUM 600 + D PO) Take 600 mg by mouth in the morning and at bedtime. Calcium 600 mg + 4000 unit D3     cetirizine (ZYRTEC) 10 MG tablet Take 10 mg by mouth daily.     Cholecalciferol (VITAMIN D3 PO) Take 5,000 Units by mouth daily.     furosemide (LASIX) 40 MG tablet Take 1 tablet (40 mg total) by mouth daily as needed (for an overnight weight gain of 3-5 pounds of fluid). 90 tablet 1   Ginkgo Biloba Extract 60 MG CAPS Take 1 capsule by mouth daily.     HYDROcodone-acetaminophen (NORCO) 7.5-325 MG tablet Take 1 tablet by mouth every 6 (six) hours as needed for moderate pain. 20 tablet 0   ipratropium-albuterol (DUONEB) 0.5-2.5 (3) MG/3ML SOLN USE 3 ML VIA NEBULIZER EVERY 6 HOURS AS NEEDED.  DX: J44.9 (Patient taking differently: Take 3 mLs by nebulization. 3 ml nebulization five times a day as needed for shortness of breath) 360 mL 5   lidocaine (LIDODERM) 5 % Place 1 patch onto the skin daily. Remove & Discard patch within 12 hours or as directed by MD (Patient taking differently: Place 1 patch onto the skin every other day. Remove & Discard patch within 12 hours or as directed by MD) 30 patch 0   losartan (COZAAR) 25 MG tablet Take 1 tablet (25 mg total) by mouth daily. 90 tablet 3   magnesium oxide (MAG-OX) 400 MG tablet Take 400 mg by mouth daily.     MUCINEX MAXIMUM STRENGTH 1200 MG TB12 Take 1,200 mg by mouth every 12 (twelve) hours.     nitroGLYCERIN (NITROSTAT) 0.4 MG SL tablet Place 1 tablet (0.4 mg total) under the  tongue every 5 (five) minutes as needed for chest pain. 25 tablet 3   nystatin (MYCOSTATIN) 100000 UNIT/ML suspension Take 5 mLs by mouth 3 (three) times daily as needed (thrush).     Olopatadine HCl (PATADAY OP) Place 1 drop into the left eye daily.     ondansetron (ZOFRAN) 4 MG tablet Take 1 tablet (4 mg total) by mouth every 8 (eight) hours as needed for nausea or vomiting. 20 tablet 0   OXYGEN Inhale 2 L/min into the lungs continuous.     PARoxetine (PAXIL) 30 MG tablet Take 1 tablet (30 mg total) by mouth daily. 90 tablet 1   potassium chloride (KLOR-CON) 10 MEQ tablet Take 1 tablet (10 mEq total) by mouth as needed (take with furosemide (lasix)). 30 tablet 11   predniSONE (DELTASONE) 10 MG tablet Take 1 tablet (10 mg total) by mouth daily. 60 tablet 2   Spacer/Aero Chamber Mouthpiece MISC 1 Device by Does not apply route as directed. 1 each 0   SYMBICORT 160-4.5 MCG/ACT inhaler INHALE 2 PUFFS TWICE DAILY (Patient taking differently: 2 puffs 2 (two) times daily.) 30 g 3   Tiotropium Bromide Monohydrate (SPIRIVA RESPIMAT) 2.5 MCG/ACT AERS Inhale 2 puffs into the lungs daily. (Patient taking differently: Inhale 2 puffs into the lungs daily as needed (wheezing).) 4 g 5   No current facility-administered medications on file prior to visit.    BP 138/70   Pulse 75  Temp 98.9 F (37.2 C) (Oral)   Ht 5' 1.75" (1.568 m)   Wt 141 lb (64 kg)   SpO2 95%   BMI 26.00 kg/m       Objective:   Physical Exam Vitals and nursing note reviewed.  Constitutional:      Appearance: Normal appearance.  Cardiovascular:     Rate and Rhythm: Normal rate and regular rhythm.     Pulses: Normal pulses.     Heart sounds: Normal heart sounds.  Pulmonary:     Effort: Pulmonary effort is normal.     Breath sounds: Normal breath sounds.     Comments: 2 L via [place] Abdominal:     General: Bowel sounds are normal.     Palpations: Abdomen is soft.  Musculoskeletal:        General: Normal range of motion.   Skin:    General: Skin is warm.  Neurological:     General: No focal deficit present.     Mental Status: She is alert and oriented to person, place, and time.  Psychiatric:        Mood and Affect: Mood normal.        Behavior: Behavior normal.        Thought Content: Thought content normal.        Judgment: Judgment normal.       Assessment & Plan:  1. COPD with acute exacerbation [facility]) -Reviewed hospital notes, discharge instructions, labs, and imaging.  All questions answered to the best of my ability.  I will have her start using a nebulizer first thing in the morning when she wakes. - Brain Natriuretic Peptide; Future - Comprehensive metabolic panel; Future - Troponin I (High Sensitivity); Future - Troponin I (High Sensitivity) - Comprehensive metabolic panel - Brain Natriuretic Peptide  2. Chronic diastolic CHF (congestive heart failure) (HCC) - Appear euvolemic today   - Brain Natriuretic Peptide; Future - Comprehensive metabolic panel; Future - Troponin I (High Sensitivity); Future - Troponin I (High Sensitivity) - Comprehensive metabolic panel - Brain Natriuretic Peptide  3. Essential hypertension - bisoprolol (ZEBETA) 5 MG tablet; Take 0.5 tablets (2.5 mg total) by mouth daily.  Dispense: 45 tablet; Refill: 1  4. Anxiety and depression -I am going to have her start taking Xanax 0.5 mg twice daily instead of waiting until she becomes short of breath.  Hopefully this will calm down her anxiety greatly reducing her elevated blood pressure, pulse, and COPD exacerbation  5. Vertigo  - Ambulatory referral to Physical Therapy  [name], NP

## 2021-10-10 ENCOUNTER — Ambulatory Visit (INDEPENDENT_AMBULATORY_CARE_PROVIDER_SITE_OTHER): Payer: Medicare Other | Admitting: Nurse Practitioner

## 2021-10-10 ENCOUNTER — Encounter: Payer: Self-pay | Admitting: Nurse Practitioner

## 2021-10-10 VITALS — BP 112/62 | HR 87 | Ht 61.75 in | Wt 139.8 lb

## 2021-10-10 DIAGNOSIS — R42 Dizziness and giddiness: Secondary | ICD-10-CM

## 2021-10-10 DIAGNOSIS — I10 Essential (primary) hypertension: Secondary | ICD-10-CM

## 2021-10-10 DIAGNOSIS — I2510 Atherosclerotic heart disease of native coronary artery without angina pectoris: Secondary | ICD-10-CM

## 2021-10-10 DIAGNOSIS — I5020 Unspecified systolic (congestive) heart failure: Secondary | ICD-10-CM | POA: Diagnosis not present

## 2021-10-10 DIAGNOSIS — I472 Ventricular tachycardia, unspecified: Secondary | ICD-10-CM

## 2021-10-10 MED ORDER — SPIRONOLACTONE 25 MG PO TABS: 12.5000 mg | ORAL_TABLET | Freq: Every day | ORAL | 11 refills | Status: DC

## 2021-10-10 NOTE — Progress Notes (Signed)
Cardiology Office Note:    Date:  10/10/2021   ID:  Kim [name], DOB 12-Nov-1955, MRN [id number]  PCP:  [name], NP   [facility] Providers Cardiologist:  Lauree Chandler, MD     Referring MD: [name], NP   Chief Complaint: hospital follow-up HFrEF  History of Present Illness:    Kim [name] is a very pleasant 66 y.o. female with a hx of   Coronary artery disease  Cath in 2017: min non-obs CAD Cath 8/22: no CAD  HFimpEF (heart failure with improved ejection fraction)  Non-ischemic cardiomyopathy  Cath 3/17: EF 20-25 Echocardiogram 5/18: normal EF Echocardiogram 4/21: Normal EF  Tako-tsubo CM Admx 8/22 >> +Trop, Echo: EF 30-35, apical WMA // no CAD on cath Echocardiogram 9/22: EF 55-60  COPD, home O2 Hypertension  Hyperlipidemia  Ex-smoker    She had a history of syncope December 2022.  She was walking and woke up on the floor, work-up reassuring by neurology.  She reported dizzy spell and blurry eye after cataract surgery and was being followed by neurology for this.  Normal EEG and MRI of brain.  Found to be orthostatic with SBP dropping 30 points.  No medication adjustment was done.  3 days prior to admission she had a loss of consciousness.  Was folding laundry and then reach to get a banana on the countertop.  Next thing she remembers is waking up on the floor, no prodromal symptoms  Admission 5/22-5/25/23 following syncope and fall. Was found to have COPD exacerbation, subdural hematoma.  Evaluated by neurosurgery with recommendation for nonsurgical management.  Telemetry was negative during hospitalization. Respiratory status significantly improved during hospitalization. Elevated troponin felt to be in the setting of panic attack/newly identified reduced LVEF. No chest pain, cath 11/2020 without evidence of obstructive CAD.   She was last seen in our office by me on 09/12/21. Presented in a wheelchair accompanied by her husband. She reported  feeling better since hospital d/c with some residual dizziness and headaches. Awaiting home therapy to call. Not very active since returning home. She denied chest pain, shortness of breath, lower extremity edema, fatigue, palpitations, melena, hematuria, hemoptysis, diaphoresis, presyncope, syncope, orthopnea, and PND.  Admission 6/13-6/14/23 with SOB. Was feeling well on the day prior to admission but awoke overnight profoundly SOB. Home O2 sat was 90%, placed on BiPAP in ED with persistent tachycardia, increased WOB with moderate air movement. Hs troponin was elevated 42 ? 272. Wearing Zio monitor for dizziness.  Seen by cardiology during admission for elevated troponin felt to be demand ischemia in the setting of COPD exacerbation.  Her metoprolol was changed to bisoprolol. Discharged 09/18/21.  Return admission 6/15-6/20/23 with recurrent exacerbation and mild CHF. LVEF normal on echo, GDMT for cardiomyopathy limited by hypotension. Discharged home 6/20 and advised to follow-up with cardiology.  Cardiac monitor revealed 1 run of VT lasting 43.4 seconds with max heart rate to 72 bpm that occurred on 09/17/2021, however we were not notified until 09/25/21. Referred to EP per Dr. Angelena Form.   Today, she is here with her husband for follow-up. Reports feeling better since hospital discharge. Headaches have resolved. Has received guidance from pulmonology on COPD protocol to follow prior to calling EMS. Since returning home, weight has been stable. No signs of fluid overload which is usually feeling heaviness in chest and hand swelling is sign. No chest pain. No worsening dyspnea or orthopnea. No edema or PND. Has staggered breathing on occasion, sounds like a spasm.  No bleeding concerns.  We discussed guidelines for monitoring BP and parameters for calling EMS. Reports that BP typically improves after 1-2 nebulizer treatments.   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   COPD  08/31/2007   DEPRESSION 11/19/2006   DYSLIPIDEMIA 03/12/2009   MI (mitral incompetence)    NEPHROLITHIASIS 01/05/2008   OSTEOARTHRITIS 06/05/2009   PARESTHESIA 08/24/2007   TOBACCO ABUSE 01/25/2010    Past Surgical History:  Procedure Laterality Date   ABDOMINAL HYSTERECTOMY     APPENDECTOMY  1969   CARDIAC CATHETERIZATION N/A 07/05/2015   Procedure: Left Heart Cath and Coronary Angiography;  Surgeon: Leonie Man, MD;  Location: [facility];  Service: Cardiovascular;  Laterality: N/A;   CHOLECYSTECTOMY  1989   collapse lung  1990   COLONOSCOPY WITH PROPOFOL N/A 05/14/2018   Procedure: COLONOSCOPY WITH PROPOFOL;  Surgeon: Irene Shipper, MD;  Location: [facility];  Service: Endoscopy;  Laterality: N/A;   EXCISION MASS ABDOMINAL  2019   EXPLORATORY LAPAROTOMY  1969   LEFT HEART CATH AND CORONARY ANGIOGRAPHY N/A 11/19/2020   Procedure: LEFT HEART CATH AND CORONARY ANGIOGRAPHY;  Surgeon: [name], MD;  Location: [facility];  Service: Cardiovascular;  Laterality: N/A;   POLYPECTOMY  05/14/2018   Procedure: POLYPECTOMY;  Surgeon: Irene Shipper, MD;  Location: [facility];  Service: Endoscopy;;   SALPINGOOPHORECTOMY  2019    Current Medications: Current Meds  Medication Sig   acetaminophen (TYLENOL) 500 MG tablet Take 500 mg by mouth 2 (two) times daily as needed for headache.   albuterol (VENTOLIN HFA) 108 (90 Base) MCG/ACT inhaler INHALE TWO PUFFS INTO LUNGS EVERY 4 TO 6 HOURS AS NEEDED FOR SHORTNESS OF BREATH/WHEEZING. (Patient taking differently: Inhale 2 puffs into the lungs every 4 (four) hours as needed for wheezing or shortness of breath.)   aspirin EC 81 MG EC tablet Take 1 tablet (81 mg total) by mouth daily. (Patient taking differently: Take 81 mg by mouth 3 days.)   bisoprolol (ZEBETA) 5 MG tablet Take 0.5 tablets (2.5 mg total) by mouth daily.   Calcium Carb-Cholecalciferol (CALCIUM 600 + D PO) Take 600 mg by mouth in the morning and at bedtime. Calcium 600  mg + 4000 unit D3   cetirizine (ZYRTEC) 10 MG tablet Take 10 mg by mouth daily.   Cholecalciferol (VITAMIN D3 PO) Take 5,000 Units by mouth daily.   furosemide (LASIX) 40 MG tablet Take 1 tablet (40 mg total) by mouth daily as needed (for an overnight weight gain of 3-5 pounds of fluid).   Ginkgo Biloba Extract 60 MG CAPS Take 1 capsule by mouth daily.   HYDROcodone-acetaminophen (NORCO) 7.5-325 MG tablet Take 1 tablet by mouth every 6 (six) hours as needed for moderate pain.   ipratropium-albuterol (DUONEB) 0.5-2.5 (3) MG/3ML SOLN USE 3 ML VIA NEBULIZER EVERY 6 HOURS AS NEEDED.  DX: J44.9 (Patient taking differently: Take 3 mLs by nebulization. 3 ml nebulization five times a day as needed for shortness of breath)   lidocaine (LIDODERM) 5 % Place 1 patch onto the skin daily. Remove & Discard patch within 12 hours or as directed by MD (Patient taking differently: Place 1 patch onto the skin every other day. Remove & Discard patch within 12 hours or as directed by MD)   losartan (COZAAR) 25 MG tablet Take 1 tablet (25 mg total) by mouth daily.   magnesium oxide (MAG-OX) 400 MG tablet Take 400 mg by mouth daily.  MUCINEX MAXIMUM STRENGTH 1200 MG TB12 Take 1,200 mg by mouth every 12 (twelve) hours.   nitroGLYCERIN (NITROSTAT) 0.4 MG SL tablet Place 1 tablet (0.4 mg total) under the tongue every 5 (five) minutes as needed for chest pain.   nystatin (MYCOSTATIN) 100000 UNIT/ML suspension Take 5 mLs by mouth 3 (three) times daily as needed (thrush).   Olopatadine HCl (PATADAY OP) Place 1 drop into the left eye daily.   ondansetron (ZOFRAN) 4 MG tablet Take 1 tablet (4 mg total) by mouth every 8 (eight) hours as needed for nausea or vomiting.   OXYGEN Inhale 2 L/min into the lungs continuous.   PARoxetine (PAXIL) 30 MG tablet Take 1 tablet (30 mg total) by mouth daily.   potassium chloride (KLOR-CON) 10 MEQ tablet Take 1 tablet (10 mEq total) by mouth as needed (take with furosemide (lasix)).   predniSONE  (DELTASONE) 10 MG tablet Take 1 tablet (10 mg total) by mouth daily.   Spacer/Aero Chamber Mouthpiece MISC 1 Device by Does not apply route as directed.   spironolactone (ALDACTONE) 25 MG tablet Take 0.5 tablets (12.5 mg total) by mouth daily.   SYMBICORT 160-4.5 MCG/ACT inhaler INHALE 2 PUFFS TWICE DAILY (Patient taking differently: 2 puffs 2 (two) times daily.)   Tiotropium Bromide Monohydrate (SPIRIVA RESPIMAT) 2.5 MCG/ACT AERS Inhale 2 puffs into the lungs daily. (Patient taking differently: Inhale 2 puffs into the lungs daily as needed (wheezing).)     Allergies:   Tape, Daliresp [roflumilast], Latex, Lisinopril, Morphine and related, Penicillins, and Omnicef [cefdinir]   Social History   Socioeconomic History   Marital status: Married    Spouse name: Thereasa Distance   Number of children: 2   Years of education: Not on file   Highest education level: Not on file  Occupational History   Occupation: Patient care aide-sits with elderly lady  Tobacco Use   Smoking status: Former    Packs/day: 2.00    Years: 38.00    Total pack years: 76.00    Types: Cigarettes    Quit date: 03/07/2014    Years since quitting: 7.6   Smokeless tobacco: Never  Vaping Use   Vaping Use: Never used  Substance and Sexual Activity   Alcohol use: No    Alcohol/week: 0.0 standard drinks of alcohol   Drug use: No   Sexual activity: Not on file  Other Topics Concern   Not on file  Social History Narrative   [facility] (01/12/17):   Originally from [facility]. Has lived in [place] for 17 years. Married. Has 2 dogs currently. No mold and only 1 indoor plant. Had her home professionally cleaned a month ago. No bird or hot tub exposure. 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    Right handed    Social Determinants of Health   Financial Resource Strain: High Risk (09/10/2021)   Overall Financial Resource Strain (CARDIA)    Difficulty of Paying Living Expenses: Very hard  Food Insecurity: No  Food Insecurity (09/10/2021)   Hunger Vital Sign    Worried About Running Out of Food in the Last Year: Never true    Ran Out of Food in the Last Year: Never true  Transportation Needs: No Transportation Needs (09/10/2021)   PRAPARE - Administrator, Civil Service (Medical): No    Lack of Transportation (Non-Medical): No  Physical Activity: Inactive (03/25/2021)   Exercise Vital Sign    Days of Exercise per Week: 0 days  Minutes of Exercise per Session: 0 min  Stress: No Stress Concern Present (09/10/2021)   [place]    Feeling of Stress : Not at all  Social Connections: Moderately Isolated (03/25/2021)   Social Connection and Isolation Panel [NHANES]    Frequency of Communication with Friends and Family: More than three times a week    Frequency of Social Gatherings with Friends and Family: More than three times a week    Attends Religious Services: Never    Marine scientist or Organizations: No    Attends Music therapist: Never    Marital Status: Married     Family History: The patient's family history includes Alcohol abuse in an other family member; Arthritis in an other family member; Cancer in her mother and another family member; Diabetes in her sister and another family member; Heart attack in her maternal uncle, mother, and other family members; Heart disease in an other family member; Hypertension in her brother and sister; Kidney disease in an other family member; Melanoma in an other family member; Multiple sclerosis in her brother and another family member; Osteoporosis in an other family member; Rheum arthritis in her sister; Stroke in her maternal grandmother and another family member; Thyroid disease in her sister. There is no history of Colon cancer.  ROS:   Please see the history of present illness.    + dizziness + headache All other systems reviewed and are  negative.  Labs/Other Studies Reviewed:    The following studies were reviewed today:  Prior CV studies:  Cardiac monitor 09/25/21  Patient had a min HR of 55 bpm, max HR of 272 bpm, and avg HR of 80 bpm. Predominant underlying rhythm was Sinus Rhythm. 1 run of Ventricular Tachycardia occurred lasting 43.4 secs with a max rate of 272 bpm (avg 230 bpm). 3 Supraventricular Tachycardia  runs occurred, the run with the fastest interval lasting 9 beats with a max rate of 187 bpm, the longest lasting 16 beats with an avg rate of 113 bpm. Isolated SVEs were occasional (2.0%, 15294), SVE Couplets were rare (<1.0%, 75), and SVE Triplets were  rare (<1.0%, 12). Isolated VEs were rare (<1.0%, 2635), VE Couplets were rare (<1.0%, 33), and VE Triplets were rare (<1.0%, 3). Some VE(s) may be SVE(s) conducted with possible aberrancy. MD notification criteria for Ventricular Tachycardia met - report  posted prior to notification per account request (BNW).   Reviewed with EP team, Dr. Lennie Odor, here in the hospital today.  Will need to be seen in the EP office this week. This VT occurred on 07/18/21 and she was admitted 6/15-6/19/23 for COPD exacerbation. We did not know about this VT on the monitor until today.   Carotid duplex 09/21/21  Right Carotid: Velocities in the right ICA are consistent with a 1-39%  stenosis.   Left Carotid: Velocities in the left ICA are consistent with a 1-39%  stenosis.   Vertebrals: Bilateral vertebral arteries demonstrate antegrade flow.  Subclavians: Normal flow hemodynamics were seen in bilateral subclavian               arteries.   Limited echo 09/20/21  EF 60 to 65%, no rwma, normal LV diastolic function Aortic valve regurgitation is mild to moderate.  Aortic valve sclerosis is present, with no evidence of aortic valve  stenosis  Echocardiogram 08/26/21 EF 35-40%, HK all mild LV segments slightly worse in mid septal LV segments, apical LV  segments appear  hyperkinetic, G1DD, mild AI  Echocardiogram 12/19/20 EF 55-60, no RWMA, normal diastolic function, normal RVSF, trivial MR   LEFT HEART CATH AND CORONARY ANGIOGRAPHY 11/19/2020 Narrative Conclusions: Mild plaquing of the proximal LAD with minimal luminal narrowing.  Otherwise, no angiographically significant coronary artery disease.  Findings are similar to prior catheterization in 2017 and presumably due to stress-induced cardiomyopathy.   Recommendations: Medical therapy and risk factor modification to prevent progression of coronary artery disease. Escalate goal-directed medical therapy for treatment of non-ischemic cardiomyopathy (presumably stress-induced).    Echocardiogram 11/17/20 Akinesis of the entire apex, EF 30-35, GR 1 DD, GLS -7.9, normal RVSF   NON-TELEMETRY MONITORING HOOKUP AND INTERP 07/25/2015 Narrative Sinus rhythm with sinus bradycardia. Frequent premature atrial contractions (PACs) with pattern of trigeminy. Rare premature ventricular contractions (PVCs) No evidence of atrial fibrillation. No evidence of ventricular tachycardia    Recent Labs: 08/26/2021: TSH 0.373 08/28/2021: Magnesium 2.1 09/29/2021: B Natriuretic Peptide 75.1; Hemoglobin 11.6; Platelets 334 10/04/2021: ALT 16; BUN 27; Creatinine, Ser 0.87; Potassium 4.3; Pro B Natriuretic peptide (BNP) 76.0; Sodium 140  Recent Lipid Panel    Component Value Date/Time   CHOL 263 (H) 01/09/2021 0945   CHOL 221 (H) 01/25/2018 0811   TRIG 228.0 (H) 01/09/2021 0945   HDL 45.70 01/09/2021 0945   HDL 41 01/25/2018 0811   CHOLHDL 6 01/09/2021 0945   VLDL 45.6 (H) 01/09/2021 0945   LDLCALC 117 (H) 10/18/2020 0836   LDLCALC 151 (H) 01/25/2018 0811   LDLDIRECT 182.0 01/09/2021 0945     Risk Assessment/Calculations:     Physical Exam:    VS:  BP 112/62   Pulse 87   Ht 5' 1.75" (1.568 m)   Wt 139 lb 12.8 oz (63.4 kg)   SpO2 95%   BMI 25.78 kg/m     Wt Readings from Last 3 Encounters:  10/10/21 139  lb 12.8 oz (63.4 kg)  10/04/21 141 lb (64 kg)  09/29/21 142 lb (64.4 kg)     GEN:  Chronically ill appearing female in no acute distress HEENT: Normal NECK: No JVD; No carotid bruits CARDIAC: RRR, no murmurs, rubs, gallops RESPIRATORY:  Increased work of breathing. Diminished throughout without rales, wheezing or rhonchi  ABDOMEN: Soft, non-tender, non-distended MUSCULOSKELETAL:  No edema; No deformity. 2+ pedal pulses, equal bilaterally SKIN: Warm and dry NEUROLOGIC:  Alert and oriented x 3 PSYCHIATRIC:  Normal affect   EKG:  EKG is not ordered today.    Diagnoses:    1. VT (ventricular tachycardia) (HCC)   2. HFrEF (heart failure with reduced ejection fraction) ([place])   3. Coronary artery disease involving native coronary artery of native heart without angina pectoris   4. Essential hypertension     Assessment and Plan:     NSVT: Seen on monitor on 09/17/21 at 0411, however was not reported to our providers until 6/21.  She reports being awakened early on the morning of 6/13 with severe shortness of breath and went to ED where she was admitted for COPD exacerbation.  Scheduled to see Dr. Lovena Le, [place] cardiologist, on 7/7.  Continue bisoprolol.  Dizziness: Carotid artery disease is mild and likely not contributing to dizziness. Continues to have some dizziness, may be 2/2 SAH.  She is scheduled for vestibular therapy in 2 weeks. Reports headaches have resolved.  HFrEF: Felt to be volume overloaded on 2 recent admissions. LVEF now 60-65% (echo 6/16), previously was 35-40% echo 08/26/21. Reports dyspnea and orthopnea are stable. Weight is  stable. Fluid restriction 2 L daily.  Reports feeling well on bisoprolol and wonders if she can increase the dose. Advised that I would  favor addition of spironolactone 12.5 mg daily as part of GDMT for CHF. She is agreeable to start spironolactone and monitor BP. Advised her to monitor BP for hypotension and to notify us if symptomatic or if dizziness  worsens once she starts the medication.  We will get BMET on 7/17, the day of her vestibular therapy.  Hypertension: BP is well controlled today. She switched bisoprolol to night time dosing to improve heart rate in the mornings. She is feeling well on this regimen. Not as many fluctuations in BP since most recent hospital d/c.  Discussed parameters for home monitoring.  She calls EMS if  BP > 200/100. Advised that she can contact our office if this occurs during office hours. Advised she may take additional 2.5 mg bisoprolol if BP is elevated > 160/80.   Disposition: Keep October appointment with Dr. Clifton James     Medication Adjustments/Labs and Tests Ordered: Current medicines are reviewed at length with the patient today.  Concerns regarding medicines are outlined above.  Orders Placed This Encounter  Procedures   Basic Metabolic Panel (BMET)   Meds ordered this encounter  Medications   spironolactone (ALDACTONE) 25 MG tablet    Sig: Take 0.5 tablets (12.5 mg total) by mouth daily.    Dispense:  15 tablet    Refill:  11    Patient Instructions  Medication Instructions:   START Aldactone one half tablet by mouth (12.5 mg) daily.   *If you need a refill on your cardiac medications before your next appointment, please call your pharmacy*   Lab Work:  Your physician recommends that you return for lab work Monday, July 17. You can come in on the day of your appointment anytime between 7:30-4:30.     If you have labs (blood work) drawn today and your tests are completely normal, you will receive your results only by: MyChart Message (if you have MyChart) OR A paper copy in the mail If you have any lab test that is abnormal or we need to change your treatment, we will call you to review the results.   Testing/Procedures:  None ordered.   Follow-Up: At [facility], you and your health needs are our priority.  As part of our continuing mission to provide you with  exceptional heart care, we have created designated Provider Care Teams.  These Care Teams include your primary Cardiologist (physician) and Advanced Practice Providers (APPs -  Physician Assistants and Nurse Practitioners) who all work together to provide you with the care you need, when you need it.  We recommend signing up for the patient portal called "MyChart".  Sign up information is provided on this After Visit Summary.  MyChart is used to connect with patients for Virtual Visits (Telemedicine).  Patients are able to view lab/test results, encounter notes, upcoming appointments, etc.  Non-urgent messages can be sent to your provider as well.   To learn more about what you can do with MyChart, go to [email or web address].    Your next appointment:   3 month(s)  The format for your next appointment:   In Person  Provider:   Verne Carrow, MD      Important Information About Sugar         Signed, Levi Aland, NP  10/10/2021 5:35 PM    [facility]

## 2021-10-10 NOTE — Patient Instructions (Signed)
Medication Instructions:   START Aldactone one half tablet by mouth (12.5 mg) daily.   *If you need a refill on your cardiac medications before your next appointment, please call your pharmacy*   Lab Work:  Your physician recommends that you return for lab work Monday, July 17. You can come in on the day of your appointment anytime between 7:30-4:30.     If you have labs (blood work) drawn today and your tests are completely normal, you will receive your results only by: [place] (if you have MyChart) OR A paper copy in the mail If you have any lab test that is abnormal or we need to change your treatment, we will call you to review the results.   Testing/Procedures:  None ordered.   Follow-Up: At [facility], you and your health needs are our priority.  As part of our continuing mission to provide you with exceptional heart care, we have created designated Provider Care Teams.  These Care Teams include your primary Cardiologist (physician) and Advanced Practice Providers (APPs -  Physician Assistants and Nurse Practitioners) who all work together to provide you with the care you need, when you need it.  We recommend signing up for the patient portal called "MyChart".  Sign up information is provided on this After Visit Summary.  MyChart is used to connect with patients for Virtual Visits (Telemedicine).  Patients are able to view lab/test results, encounter notes, upcoming appointments, etc.  Non-urgent messages can be sent to your provider as well.   To learn more about what you can do with MyChart, go to [email or web address].    Your next appointment:   3 month(s)  The format for your next appointment:   In Person  Provider:   Lauree Chandler, MD      Important Information About Sugar

## 2021-10-11 ENCOUNTER — Encounter: Payer: Self-pay | Admitting: Internal Medicine

## 2021-10-11 ENCOUNTER — Ambulatory Visit (INDEPENDENT_AMBULATORY_CARE_PROVIDER_SITE_OTHER): Payer: Medicare Other | Admitting: Internal Medicine

## 2021-10-11 DIAGNOSIS — I4729 Other ventricular tachycardia: Secondary | ICD-10-CM

## 2021-10-11 DIAGNOSIS — F419 Anxiety disorder, unspecified: Secondary | ICD-10-CM | POA: Diagnosis not present

## 2021-10-11 DIAGNOSIS — F32A Depression, unspecified: Secondary | ICD-10-CM | POA: Diagnosis not present

## 2021-10-11 MED ORDER — BISOPROLOL FUMARATE 5 MG PO TABS: 2.5000 mg | ORAL_TABLET | Freq: Every day | ORAL | 1 refills | Status: DC | PRN

## 2021-10-11 NOTE — Progress Notes (Signed)
HPI Kim [name] is referred by Alferd Apa, NP for evaluation of VT. 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 She also had HTN and dyslipidemia. She has palpitations with HR's on her BP machine in the 150's. She has episodes of weakness and dizziness but denies frank syncope. With her last episode in 5/23, she describes falling and hitting her head and she developed a SDH. She was admitted in June with sob and thought to have a COPD exacerbation. EF was normal. She has worn a zio monitor which demonstrates a single episode of VT at 0400 in the morning. She was sleeping. She still has orthostatic symptoms.  Allergies  Allergen Reactions   Tape Other (See Comments)    Skin tears easily, peels skin off   Daliresp [Roflumilast]     Skin peeling   Latex Other (See Comments)    Peels skin off   Lisinopril Cough   Morphine And Related     hallucinations   Penicillins Hives and Other (See Comments)    Tolerates Omnicef Has patient had a PCN reaction causing immediate rash, facial/tongue/throat swelling, SOB or lightheadedness with hypotension: Yes Has patient had a PCN reaction causing severe rash involving mucus membranes or skin necrosis: No Has patient had a PCN reaction that required hospitalization No Has patient had a PCN reaction occurring within the last 10 years: No If all of the above answers are "NO", then may proceed with Cephalosporin use.    Omnicef [Cefdinir] Rash     Current Outpatient Medications  Medication Sig Dispense Refill   acetaminophen (TYLENOL) 500 MG tablet Take 500 mg by mouth 2 (two) times daily as needed for headache.     albuterol (VENTOLIN HFA) 108 (90 Base) MCG/ACT inhaler INHALE TWO PUFFS INTO LUNGS EVERY 4 TO 6 HOURS AS NEEDED FOR SHORTNESS OF BREATH/WHEEZING. (Patient taking differently: Inhale 2 puffs into the lungs every 4 (four) hours as needed for wheezing or  shortness of breath.) 18 g 1   aspirin EC 81 MG EC tablet Take 1 tablet (81 mg total) by mouth daily. (Patient taking differently: Take 81 mg by mouth 3 days.)     bisoprolol (ZEBETA) 5 MG tablet Take 0.5 tablets (2.5 mg total) by mouth daily. 45 tablet 1   Calcium Carb-Cholecalciferol (CALCIUM 600 + D PO) Take 600 mg by mouth in the morning and at bedtime. Calcium 600 mg + 4000 unit D3     cetirizine (ZYRTEC) 10 MG tablet Take 10 mg by mouth daily.     Cholecalciferol (VITAMIN D3 PO) Take 5,000 Units by mouth daily.     furosemide (LASIX) 40 MG tablet Take 1 tablet (40 mg total) by mouth daily as needed (for an overnight weight gain of 3-5 pounds of fluid). 90 tablet 1   Ginkgo Biloba Extract 60 MG CAPS Take 1 capsule by mouth daily.     HYDROcodone-acetaminophen (NORCO) 7.5-325 MG tablet Take 1 tablet by mouth every 6 (six) hours as needed for moderate pain. 20 tablet 0   ipratropium-albuterol (DUONEB) 0.5-2.5 (3) MG/3ML SOLN USE 3 ML VIA NEBULIZER EVERY 6 HOURS AS NEEDED.  DX: J44.9 (Patient taking differently: Take 3 mLs by nebulization. 3 ml nebulization five times a day as needed for shortness of breath) 360 mL 5   lidocaine (LIDODERM) 5 % Place 1 patch onto the skin daily. Remove & Discard patch within 12 hours  or as directed by MD (Patient taking differently: Place 1 patch onto the skin every other day. Remove & Discard patch within 12 hours or as directed by MD) 30 patch 0   losartan (COZAAR) 25 MG tablet Take 1 tablet (25 mg total) by mouth daily. 90 tablet 3   magnesium oxide (MAG-OX) 400 MG tablet Take 400 mg by mouth daily.     MUCINEX MAXIMUM STRENGTH 1200 MG TB12 Take 1,200 mg by mouth every 12 (twelve) hours.     nitroGLYCERIN (NITROSTAT) 0.4 MG SL tablet Place 1 tablet (0.4 mg total) under the tongue every 5 (five) minutes as needed for chest pain. 25 tablet 3   nystatin (MYCOSTATIN) 100000 UNIT/ML suspension Take 5 mLs by mouth 3 (three) times daily as needed (thrush).      Olopatadine HCl (PATADAY OP) Place 1 drop into the left eye daily.     ondansetron (ZOFRAN) 4 MG tablet Take 1 tablet (4 mg total) by mouth every 8 (eight) hours as needed for nausea or vomiting. 20 tablet 0   OXYGEN Inhale 2 L/min into the lungs continuous.     PARoxetine (PAXIL) 30 MG tablet Take 1 tablet (30 mg total) by mouth daily. 90 tablet 1   potassium chloride (KLOR-CON) 10 MEQ tablet Take 1 tablet (10 mEq total) by mouth as needed (take with furosemide (lasix)). 30 tablet 11   predniSONE (DELTASONE) 10 MG tablet Take 1 tablet (10 mg total) by mouth daily. 60 tablet 2   Spacer/Aero Chamber Mouthpiece MISC 1 Device by Does not apply route as directed. 1 each 0   spironolactone (ALDACTONE) 25 MG tablet Take 0.5 tablets (12.5 mg total) by mouth daily. 15 tablet 11   SYMBICORT 160-4.5 MCG/ACT inhaler INHALE 2 PUFFS TWICE DAILY (Patient taking differently: 2 puffs 2 (two) times daily.) 30 g 3   Tiotropium Bromide Monohydrate (SPIRIVA RESPIMAT) 2.5 MCG/ACT AERS Inhale 2 puffs into the lungs daily. (Patient taking differently: Inhale 2 puffs into the lungs daily as needed (wheezing).) 4 g 5   No current facility-administered medications for this visit.     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   OSTEOARTHRITIS 06/05/2009   PARESTHESIA 08/24/2007   TOBACCO ABUSE 01/25/2010    ROS:   All systems reviewed and negative except as noted in the HPI.   Past Surgical History:  Procedure Laterality Date   ABDOMINAL HYSTERECTOMY     APPENDECTOMY  1969   CARDIAC CATHETERIZATION N/A 07/05/2015   Procedure: Left Heart Cath and Coronary Angiography;  Surgeon: Leonie Man, MD;  Location: [facility];  Service: Cardiovascular;  Laterality: N/A;   CHOLECYSTECTOMY  1989   collapse lung  1990   COLONOSCOPY WITH PROPOFOL N/A 05/14/2018    Procedure: COLONOSCOPY WITH PROPOFOL;  Surgeon: Irene Shipper, MD;  Location: [facility];  Service: Endoscopy;  Laterality: N/A;   EXCISION MASS ABDOMINAL  2019   EXPLORATORY LAPAROTOMY  1969   LEFT HEART CATH AND CORONARY ANGIOGRAPHY N/A 11/19/2020   Procedure: LEFT HEART CATH AND CORONARY ANGIOGRAPHY;  Surgeon: [name], MD;  Location: [facility];  Service: Cardiovascular;  Laterality: N/A;   POLYPECTOMY  05/14/2018   Procedure: POLYPECTOMY;  Surgeon: Irene Shipper, MD;  Location: [facility];  Service: Endoscopy;;   SALPINGOOPHORECTOMY  2019     Family  History  Problem Relation Age of Onset   Cancer Mother        lung   Heart attack Mother    Hypertension Sister    Multiple sclerosis Brother    Diabetes Sister    Rheum arthritis Sister    Thyroid disease Sister    Multiple sclerosis Other    Alcohol abuse Other    Arthritis Other    Diabetes Other    Kidney disease Other    Cancer Other        lung,ovarian,skin, uterine   Stroke Other    Heart disease Other    Melanoma Other    Osteoporosis Other    Heart attack Maternal Uncle    Heart attack Other        NEICE   Heart attack Other        NEPHEW   Hypertension Brother    Stroke Maternal Grandmother    Colon cancer Neg Hx      Social History   Socioeconomic History   Marital status: Married    Spouse name: [name]   Number of children: 2   Years of education: Not on file   Highest education level: Not on file  Occupational History   Occupation: Patient care aide-sits with elderly lady  Tobacco Use   Smoking status: Former    Packs/day: 2.00    Years: 38.00    Total pack years: 76.00    Types: Cigarettes    Quit date: 03/07/2014    Years since quitting: 7.6   Smokeless tobacco: Never  Vaping Use   Vaping Use: Never used  Substance and Sexual Activity   Alcohol use: No    Alcohol/week: 0.0 standard drinks of alcohol   Drug use: No   Sexual activity: Not on file  Other Topics Concern    Not on file  Social History Narrative   [facility] (01/12/17):   Originally from [facility]. Has lived in [place] for 17 years. Married. Has 2 dogs currently. No mold and only 1 indoor plant. Had her home professionally cleaned a month ago. No bird or hot tub exposure. 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    Right handed    Social Determinants of Health   Financial Resource Strain: High Risk (09/10/2021)   Overall Financial Resource Strain (CARDIA)    Difficulty of Paying Living Expenses: Very hard  Food Insecurity: No Food Insecurity (09/10/2021)   Hunger Vital Sign    Worried About Running Out of Food in the Last Year: Never true    Ran Out of Food in the Last Year: Never true  Transportation Needs: No Transportation Needs (09/10/2021)   PRAPARE - Hydrologist (Medical): No    Lack of Transportation (Non-Medical): No  Physical Activity: Inactive (03/25/2021)   Exercise Vital Sign    Days of Exercise per Week: 0 days    Minutes of Exercise per Session: 0 min  Stress: No Stress Concern Present (09/10/2021)   [place]    Feeling of Stress : Not at all  Social Connections: Moderately Isolated (03/25/2021)   Social Connection and Isolation Panel [NHANES]    Frequency of Communication with Friends and Family: More than three times a week    Frequency of Social Gatherings with Friends and Family: More than three times a week    Attends Religious Services: Never    Active Member  of Clubs or Organizations: No    Attends Archivist Meetings: Never    Marital Status: Married  Human resources officer Violence: Not At Risk (03/25/2021)   Humiliation, Afraid, Rape, and Kick questionnaire    Fear of Current or Ex-Partner: No    Emotionally Abused: No    Physically Abused: No    Sexually Abused: No     BP 116/62   Pulse 70   Ht '5\' 2"'$  (1.575 m)   Wt 139 lb (63 kg)    SpO2 95%   BMI 25.42 kg/m   Physical Exam:  Well appearing NAD HEENT: Unremarkable Neck:  No JVD, no thyromegally Lymphatics:  No adenopathy Back:  No CVA tenderness Lungs:  Clear with reduced breath sounds but no wheezes HEART:  Regular rate rhythm, no murmurs, no rubs, no clicks Abd:  soft, positive bowel sounds, no organomegally, no rebound, no guarding Ext:  2 plus pulses, no edema, no cyanosis, no clubbing Skin:  No rashes no nodules Neuro:  CN II through XII intact, motor grossly intact  EKG - reviewed. NSR with poor r wave progression.   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 She will continue her beta blocker and undergo watchful waiting. We discussed uptitration but will hold off for now. CAD - no angina. She has only mild non-obstructive disease by cath less than a year ago.  COPD - she will remain on home oxygen and bronchodilators.  Syncope - her symptoms are most consistent with orthostasis. Her only documented VT is while sleeping. I offered her an ILR for ongoing evaluation and she would like to reflect on this and will call us if she would like to proceed.  Carleene Overlie Alam Guterrez,MD

## 2021-10-11 NOTE — Patient Instructions (Addendum)
Medication Instructions:  Bisoprolol 2.5 mg-  Take HALF tablet by mouth for break through CHEST PAIN as needed.       Lab Work: None ordered.  If you have labs (blood work) drawn today and your tests are completely normal, you will receive your results only by: [place] (if you have MyChart) OR A paper copy in the mail If you have any lab test that is abnormal or we need to change your treatment, we will call you to review the results.  Testing/Procedures: None ordered.  Follow-Up: At [facility], you and your health needs are our priority.  As part of our continuing mission to provide you with exceptional heart care, we have created designated Provider Care Teams.  These Care Teams include your primary Cardiologist (physician) and Advanced Practice Providers (APPs -  Physician Assistants and Nurse Practitioners) who all work together to provide you with the care you need, when you need it.  We recommend signing up for the patient portal called "MyChart".  Sign up information is provided on this After Visit Summary.  MyChart is used to connect with patients for Virtual Visits (Telemedicine).  Patients are able to view lab/test results, encounter notes, upcoming appointments, etc.  Non-urgent messages can be sent to your provider as well.   To learn more about what you can do with MyChart, go to [email or web address].    Your next appointment:   AS NEEDED  IF YOU DECIDE YOU WANT TO HAVE A LOOP IMPLANT CONTACT JENNY RN   Important Information About Sugar

## 2021-10-14 ENCOUNTER — Other Ambulatory Visit: Payer: Self-pay

## 2021-10-14 MED ORDER — BISOPROLOL FUMARATE 5 MG PO TABS: 2.5000 mg | ORAL_TABLET | Freq: Every day | ORAL | 3 refills | Status: DC | PRN

## 2021-10-15 ENCOUNTER — Ambulatory Visit (INDEPENDENT_AMBULATORY_CARE_PROVIDER_SITE_OTHER): Payer: Medicare Other | Admitting: Adult Health

## 2021-10-15 ENCOUNTER — Encounter: Payer: Self-pay | Admitting: Adult Health

## 2021-10-15 VITALS — BP 120/62 | HR 67 | Temp 98.3°F | Ht 62.0 in | Wt 140.0 lb

## 2021-10-15 DIAGNOSIS — I2510 Atherosclerotic heart disease of native coronary artery without angina pectoris: Secondary | ICD-10-CM

## 2021-10-15 DIAGNOSIS — J439 Emphysema, unspecified: Secondary | ICD-10-CM

## 2021-10-15 DIAGNOSIS — F419 Anxiety disorder, unspecified: Secondary | ICD-10-CM | POA: Diagnosis not present

## 2021-10-15 DIAGNOSIS — I5032 Chronic diastolic (congestive) heart failure: Secondary | ICD-10-CM

## 2021-10-15 DIAGNOSIS — G8929 Other chronic pain: Secondary | ICD-10-CM

## 2021-10-15 DIAGNOSIS — E782 Mixed hyperlipidemia: Secondary | ICD-10-CM

## 2021-10-15 LAB — LIPID PANEL
Cholesterol: 241 mg/dL — ABNORMAL HIGH (ref 0–200)
HDL: 65.9 mg/dL (ref >39.00)
LDL Cholesterol: 149 mg/dL — ABNORMAL HIGH (ref 0–99)
NonHDL: 174.9
Total CHOL/HDL Ratio: 4
Triglycerides: 129 mg/dL (ref 0.0–149.0)
VLDL: 25.8 mg/dL (ref 0.0–40.0)

## 2021-10-15 MED ORDER — PAROXETINE HCL 40 MG PO TABS: 40.0000 mg | ORAL_TABLET | ORAL | 1 refills | Status: DC

## 2021-10-15 NOTE — Patient Instructions (Addendum)
It was great seeing you today   We will follow up with you regarding your lab work   Please let me know if you need anything   Try using Biotene to keep your mouth wet

## 2021-10-15 NOTE — Progress Notes (Signed)
Subjective:    Patient ID: Kim [name], female    DOB: 10/27/1955, 66 y.o.   MRN: [id number]  HPI Patient presents for yearly preventative medicine examination. She is a pleasant 66 year old female who  has a past medical history of C8 RADICULOPATHY (06/05/2009), COPD (08/31/2007), DEPRESSION (11/19/2006), DYSLIPIDEMIA (03/12/2009), MI (mitral incompetence), NEPHROLITHIASIS (01/05/2008), OSTEOARTHRITIS (06/05/2009), PARESTHESIA (08/24/2007), and TOBACCO ABUSE (01/25/2010).  COPD -managed closely by pulmonary.  She has had multiple recent exacerbations/hospital admissions for COPD.  She does use oxygen via 2 L nasal cannula.  Using BiPAP nightly.  Is also prescribed Symbicort, Spiriva, and oral prednisone 10 mg daily.  Chronic Systolic and Diastolic CHF -managed by cardiology.  She will use Lasix as needed.  Her daily medications include spironolactone 12.5 mg daily, losartan 25 mg daily, and bisoprolol 2.5 mg daily.  She reports that her weight has been stable she has not had any lower extremity edema.  Anxiety -managed with Paxil 30 mg daily and Xanax 0.5 mg twice daily.  She continues to have issues with anxiety especially in the early morning.  Hyperlipidemia/CAD -history of cath in 2017 which showed minimally of nonobstructive CAD and repeat cath in August 2022 which showed no CAD.  She has not tolerated statins in the past.  Most currently was on Crestor but this caused muscle pain.  She also has been on simvastatin but this again caused muscle pain.  Osteoarthritis involving multiple joints-she is currently managed with Norco 7.5-325 mg every 6 hours as needed.  She gets 45 pills/month.    All immunizations and health maintenance protocols were reviewed with the patient and needed orders were placed.  Appropriate screening laboratory values were ordered for the patient including screening of hyperlipidemia, renal function and hepatic function. If indicated by BPH, a PSA was  ordered.  Medication reconciliation,  past medical history, social history, problem list and allergies were reviewed in detail with the patient  Goals were established with regard to weight loss, exercise, and  diet in compliance with medications Wt Readings from Last 3 Encounters:  10/15/21 140 lb (63.5 kg)  10/11/21 139 lb (63 kg)  10/10/21 139 lb 12.8 oz (63.4 kg)   Review of Systems  Constitutional: Negative.   HENT: Negative.    Eyes: Negative.   Respiratory:  Positive for cough, shortness of breath and wheezing.   Cardiovascular: Negative.   Gastrointestinal: Negative.   Endocrine: Negative.   Genitourinary: Negative.   Musculoskeletal:  Positive for arthralgias and back pain.  Skin: Negative.   Allergic/Immunologic: Negative.   Neurological: Negative.   Hematological: Negative.   Psychiatric/Behavioral:  The patient is nervous/anxious.    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   OSTEOARTHRITIS 06/05/2009   PARESTHESIA 08/24/2007   TOBACCO ABUSE 01/25/2010    Social History   Socioeconomic History   Marital status: Married    Spouse name: [name]   Number of children: 2   Years of education: Not on file   Highest education level: Not on file  Occupational History   Occupation: Patient care aide-sits with elderly lady  Tobacco Use   Smoking status: Former    Packs/day: 2.00    Years: 38.00    Total pack years: 76.00    Types: Cigarettes    Quit date: 03/07/2014    Years since quitting:  7.6   Smokeless tobacco: Never  Vaping Use   Vaping Use: Never used  Substance and Sexual Activity   Alcohol use: No    Alcohol/week: 0.0 standard drinks of alcohol   Drug use: No   Sexual activity: Not on file  Other Topics Concern   Not on file  Social History Narrative    Pulmonary (01/12/17):   Originally from  [place]. Has lived in [place] for 17 years. Married. Has 2 dogs currently. No mold and only 1 indoor plant. Had her home professionally cleaned a month ago. No bird or hot tub exposure. 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    Right handed    Social Determinants of Health   Financial Resource Strain: High Risk (09/10/2021)   Overall Financial Resource Strain (CARDIA)    Difficulty of Paying Living Expenses: Very hard  Food Insecurity: No Food Insecurity (09/10/2021)   Hunger Vital Sign    Worried About Running Out of Food in the Last Year: Never true    Ran Out of Food in the Last Year: Never true  Transportation Needs: No Transportation Needs (09/10/2021)   PRAPARE - Hydrologist (Medical): No    Lack of Transportation (Non-Medical): No  Physical Activity: Inactive (03/25/2021)   Exercise Vital Sign    Days of Exercise per Week: 0 days    Minutes of Exercise per Session: 0 min  Stress: No Stress Concern Present (09/10/2021)   [place]    Feeling of Stress : Not at all  Social Connections: Moderately Isolated (03/25/2021)   Social Connection and Isolation Panel [NHANES]    Frequency of Communication with Friends and Family: More than three times a week    Frequency of Social Gatherings with Friends and Family: More than three times a week    Attends Religious Services: Never    Marine scientist or Organizations: No    Attends Archivist Meetings: Never    Marital Status: Married  Human resources officer Violence: Not At Risk (03/25/2021)   Humiliation, Afraid, Rape, and Kick questionnaire    Fear of Current or Ex-Partner: No    Emotionally Abused: No    Physically Abused: No    Sexually Abused: No    Past Surgical History:  Procedure Laterality Date   [place] N/A 07/05/2015   Procedure: Left  Heart Cath and Coronary Angiography;  Surgeon: Leonie Man, MD;  Location: [place] [facility];  Service: Cardiovascular;  Laterality: N/A;   CHOLECYSTECTOMY  1989   collapse lung  1990   COLONOSCOPY WITH PROPOFOL N/A 05/14/2018   Procedure: COLONOSCOPY WITH PROPOFOL;  Surgeon: Irene Shipper, MD;  [facility];  Service: Endoscopy;  Laterality: N/A;   EXCISION MASS ABDOMINAL  2019   EXPLORATORY LAPAROTOMY  1969   LEFT HEART CATH AND CORONARY ANGIOGRAPHY N/A 11/19/2020   Procedure: LEFT HEART CATH AND CORONARY ANGIOGRAPHY;  Surgeon: [name], MD;  Location: [place] [facility];  Service: Cardiovascular;  Laterality: N/A;   POLYPECTOMY  05/14/2018   Procedure: POLYPECTOMY;  Surgeon: Irene Shipper, MD;  [facility];  Service: Endoscopy;;   SALPINGOOPHORECTOMY  2019    Family History  Problem Relation Age of Onset   Cancer Mother        lung   Heart attack Mother  Hypertension Sister    Multiple sclerosis Brother    Diabetes Sister    Rheum arthritis Sister    Thyroid disease Sister    Multiple sclerosis Other    Alcohol abuse Other    Arthritis Other    Diabetes Other    Kidney disease Other    Cancer Other        lung,ovarian,skin, uterine   Stroke Other    Heart disease Other    Melanoma Other    Osteoporosis Other    Heart attack Maternal Uncle    Heart attack Other        NEICE   Heart attack Other        NEPHEW   Hypertension Brother    Stroke Maternal Grandmother    Colon cancer Neg Hx     Allergies  Allergen Reactions   Tape Other (See Comments)    Skin tears easily, peels skin off   Daliresp [Roflumilast]     Skin peeling   Latex Other (See Comments)    Peels skin off   Lisinopril Cough   Morphine And Related     hallucinations   Penicillins Hives and Other (See Comments)    Tolerates Omnicef Has patient had a PCN reaction causing immediate rash, facial/tongue/throat swelling, SOB or lightheadedness with hypotension: Yes Has  patient had a PCN reaction causing severe rash involving mucus membranes or skin necrosis: No Has patient had a PCN reaction that required hospitalization No Has patient had a PCN reaction occurring within the last 10 years: No If all of the above answers are "NO", then may proceed with Cephalosporin use.    Statins Other (See Comments)    Myalgia    Omnicef [Cefdinir] Rash    Current Outpatient Medications on File Prior to Visit  Medication Sig Dispense Refill   acetaminophen (TYLENOL) 500 MG tablet Take 500 mg by mouth 2 (two) times daily as needed for headache.     albuterol (VENTOLIN HFA) 108 (90 Base) MCG/ACT inhaler INHALE TWO PUFFS INTO LUNGS EVERY 4 TO 6 HOURS AS NEEDED FOR SHORTNESS OF BREATH/WHEEZING. (Patient taking differently: Inhale 2 puffs into the lungs every 4 (four) hours as needed for wheezing or shortness of breath.) 18 g 1   aspirin EC 81 MG EC tablet Take 1 tablet (81 mg total) by mouth daily. (Patient taking differently: Take 81 mg by mouth 3 days.)     bisoprolol (ZEBETA) 5 MG tablet Take 0.5 tablets (2.5 mg total) by mouth daily as needed (Take HALF tablet by mouth for break through chest pain as needed). 45 tablet 3   Calcium Carb-Cholecalciferol (CALCIUM 600 + D PO) Take 600 mg by mouth in the morning and at bedtime. Calcium 600 mg + 4000 unit D3     cetirizine (ZYRTEC) 10 MG tablet Take 10 mg by mouth daily.     Cholecalciferol (VITAMIN D3 PO) Take 5,000 Units by mouth daily.     furosemide (LASIX) 40 MG tablet Take 1 tablet (40 mg total) by mouth daily as needed (for an overnight weight gain of 3-5 pounds of fluid). 90 tablet 1   Ginkgo Biloba Extract 60 MG CAPS Take 1 capsule by mouth daily.     HYDROcodone-acetaminophen (NORCO) 7.5-325 MG tablet Take 1 tablet by mouth every 6 (six) hours as needed for moderate pain. 20 tablet 0   ipratropium-albuterol (DUONEB) 0.5-2.5 (3) MG/3ML SOLN USE 3 ML VIA NEBULIZER EVERY 6 HOURS AS NEEDED.  DX: J44.9 (Patient taking  differently: Take 3 mLs by nebulization. 3 ml nebulization five times a day as needed for shortness of breath) 360 mL 5   lidocaine (LIDODERM) 5 % Place 1 patch onto the skin daily. Remove & Discard patch within 12 hours or as directed by MD (Patient taking differently: Place 1 patch onto the skin every other day. Remove & Discard patch within 12 hours or as directed by MD) 30 patch 0   losartan (COZAAR) 25 MG tablet Take 1 tablet (25 mg total) by mouth daily. 90 tablet 3   magnesium oxide (MAG-OX) 400 MG tablet Take 400 mg by mouth daily.     MUCINEX MAXIMUM STRENGTH 1200 MG TB12 Take 1,200 mg by mouth every 12 (twelve) hours.     nitroGLYCERIN (NITROSTAT) 0.4 MG SL tablet Place 1 tablet (0.4 mg total) under the tongue every 5 (five) minutes as needed for chest pain. 25 tablet 3   nystatin (MYCOSTATIN) 100000 UNIT/ML suspension Take 5 mLs by mouth 3 (three) times daily as needed (thrush).     Olopatadine HCl (PATADAY OP) Place 1 drop into the left eye daily.     ondansetron (ZOFRAN) 4 MG tablet Take 1 tablet (4 mg total) by mouth every 8 (eight) hours as needed for nausea or vomiting. 20 tablet 0   OXYGEN Inhale 2 L/min into the lungs continuous.     potassium chloride (KLOR-CON) 10 MEQ tablet Take 1 tablet (10 mEq total) by mouth as needed (take with furosemide (lasix)). 30 tablet 11   predniSONE (DELTASONE) 10 MG tablet Take 1 tablet (10 mg total) by mouth daily. 60 tablet 2   Spacer/Aero Chamber Mouthpiece MISC 1 Device by Does not apply route as directed. 1 each 0   spironolactone (ALDACTONE) 25 MG tablet Take 0.5 tablets (12.5 mg total) by mouth daily. 15 tablet 11   SYMBICORT 160-4.5 MCG/ACT inhaler INHALE 2 PUFFS TWICE DAILY (Patient taking differently: 2 puffs 2 (two) times daily.) 30 g 3   Tiotropium Bromide Monohydrate (SPIRIVA RESPIMAT) 2.5 MCG/ACT AERS Inhale 2 puffs into the lungs daily. (Patient taking differently: Inhale 2 puffs into the lungs daily as needed (wheezing).) 4 g 5   No  current facility-administered medications on file prior to visit.    BP 120/62   Pulse 67   Temp 98.3 F (36.8 C) (Oral)   Ht '5\' 2"'$  (1.575 m)   Wt 140 lb (63.5 kg)   SpO2 97%   BMI 25.61 kg/m       Objective:   Physical Exam Vitals and nursing note reviewed.  Constitutional:      Appearance: Normal appearance.  Cardiovascular:     Rate and Rhythm: Normal rate and regular rhythm.     Pulses: Normal pulses.     Heart sounds: Normal heart sounds.  Pulmonary:     Effort: Pulmonary effort is normal.     Breath sounds: Normal breath sounds.  Abdominal:     General: Abdomen is flat. Bowel sounds are normal.     Palpations: Abdomen is soft.  Musculoskeletal:        General: Normal range of motion.     Right lower leg: No edema.     Left lower leg: No edema.  Skin:    General: Skin is warm and dry.     Capillary Refill: Capillary refill takes less than 2 seconds.  Neurological:     General: No focal deficit present.     Mental Status: She is alert and oriented to  person, place, and time.  Psychiatric:        Mood and Affect: Mood normal.        Behavior: Behavior normal.        Thought Content: Thought content normal.        Judgment: Judgment normal.        Assessment & Plan:  1. Chronic diastolic CHF (congestive heart failure) ([place]) -Follow-up with cardiology as directed.  Appears euvolemic today.  2. Mixed hyperlipidemia -Consider Zetia or Repatha - Lipid Panel; Future - Lipid Panel  3. Anxiety -We will increase her Paxil from 30 mg daily to 40 mg daily to see if we can get better control of her anxiety - PARoxetine (PAXIL) 40 MG tablet; Take 1 tablet (40 mg total) by mouth every morning.  Dispense: 90 tablet; Refill: 1  4. Encounter for chronic pain management  - DRUG MONITOR, PANEL 1, W/CONF, URINE; Future - DRUG MONITOR, PANEL 1, W/CONF, URINE -Reviewed consult substance database.  No red flags noted 5. Pulmonary emphysema, unspecified emphysema type  ([place]) - Per pulmonary   6. Coronary artery disease involving native heart without angina pectoris, unspecified vessel or lesion type  - Lipid Panel; Future - Lipid Panel  [name], NP

## 2021-10-16 ENCOUNTER — Telehealth: Payer: Self-pay | Admitting: Adult Health

## 2021-10-16 MED ORDER — EZETIMIBE 10 MG PO TABS: 10.0000 mg | ORAL_TABLET | Freq: Every day | ORAL | 1 refills | Status: DC

## 2021-10-16 NOTE — Telephone Encounter (Signed)
Patient ok with trying zetia

## 2021-10-18 ENCOUNTER — Other Ambulatory Visit: Payer: Self-pay | Admitting: Adult Health

## 2021-10-18 LAB — DRUG MONITOR, PANEL 1, W/CONF, URINE
Alphahydroxyalprazolam: 932 ng/mL — ABNORMAL HIGH (ref <25)
Alphahydroxymidazolam: NEGATIVE ng/mL (ref <50)
Alphahydroxytriazolam: NEGATIVE ng/mL (ref <50)
Aminoclonazepam: NEGATIVE ng/mL (ref <25)
Amphetamines: NEGATIVE ng/mL (ref <500)
Barbiturates: NEGATIVE ng/mL (ref <300)
Benzodiazepines: POSITIVE ng/mL — AB (ref <100)
Cocaine Metabolite: NEGATIVE ng/mL (ref <150)
Codeine: NEGATIVE ng/mL (ref <50)
Creatinine: 212.9 mg/dL (ref >=20.0)
Hydrocodone: 1609 ng/mL — ABNORMAL HIGH (ref <50)
Hydromorphone: 192 ng/mL — ABNORMAL HIGH (ref <50)
Hydroxyethylflurazepam: NEGATIVE ng/mL (ref <50)
Lorazepam: NEGATIVE ng/mL (ref <50)
Marijuana Metabolite: NEGATIVE ng/mL (ref <20)
Methadone Metabolite: NEGATIVE ng/mL (ref <100)
Morphine: NEGATIVE ng/mL (ref <50)
Nordiazepam: NEGATIVE ng/mL (ref <50)
Norhydrocodone: 3820 ng/mL — ABNORMAL HIGH (ref <50)
Opiates: POSITIVE ng/mL — AB (ref <100)
Oxazepam: NEGATIVE ng/mL (ref <50)
Oxidant: NEGATIVE ug/mL (ref <200)
Oxycodone: NEGATIVE ng/mL (ref <100)
Phencyclidine: NEGATIVE ng/mL (ref <25)
Temazepam: NEGATIVE ng/mL (ref <50)
pH: 5.5 (ref 4.5–9.0)

## 2021-10-18 LAB — DM TEMPLATE

## 2021-10-18 MED ORDER — HYDROCODONE-ACETAMINOPHEN 7.5-325 MG PO TABS: 1.0000 | ORAL_TABLET | Freq: Four times a day (QID) | ORAL | 0 refills | Status: DC | PRN

## 2021-10-21 ENCOUNTER — Ambulatory Visit: Payer: Medicare Other | Admitting: Neurology

## 2021-10-21 ENCOUNTER — Ambulatory Visit: Payer: Medicare Other | Attending: Adult Health

## 2021-10-21 ENCOUNTER — Other Ambulatory Visit: Payer: Medicare Other

## 2021-10-21 VITALS — BP 104/54 | HR 74

## 2021-10-21 DIAGNOSIS — I10 Essential (primary) hypertension: Secondary | ICD-10-CM

## 2021-10-21 DIAGNOSIS — R42 Dizziness and giddiness: Secondary | ICD-10-CM | POA: Diagnosis not present

## 2021-10-21 DIAGNOSIS — I5020 Unspecified systolic (congestive) heart failure: Secondary | ICD-10-CM

## 2021-10-21 DIAGNOSIS — H8111 Benign paroxysmal vertigo, right ear: Secondary | ICD-10-CM | POA: Diagnosis present

## 2021-10-21 DIAGNOSIS — I2510 Atherosclerotic heart disease of native coronary artery without angina pectoris: Secondary | ICD-10-CM

## 2021-10-21 DIAGNOSIS — R2681 Unsteadiness on feet: Secondary | ICD-10-CM | POA: Insufficient documentation

## 2021-10-21 DIAGNOSIS — I472 Ventricular tachycardia, unspecified: Secondary | ICD-10-CM

## 2021-10-21 NOTE — Therapy (Signed)
OUTPATIENT PHYSICAL THERAPY VESTIBULAR EVALUATION     Patient Name: Kim [name] MRN: [id number] DOB:[birth year], 66 y.o., female Today's Date: 10/21/2021  PCP: [name], NP REFERRING PROVIDER: [name], NP   PT End of Session - 10/21/21 1401     Visit Number 1    Number of Visits 9    Date for PT Re-Evaluation 11/22/21    Authorization Type Medicare A+B/BCBS    PT Start Time 1401    PT Stop Time 7893    PT Time Calculation (min) 46 min    Activity Tolerance Patient tolerated treatment well    Behavior During Therapy [facility] for tasks assessed/performed             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   OSTEOARTHRITIS 06/05/2009   PARESTHESIA 08/24/2007   TOBACCO ABUSE 01/25/2010   Past Surgical History:  Procedure Laterality Date   ABDOMINAL HYSTERECTOMY     APPENDECTOMY  1969   CARDIAC CATHETERIZATION N/A 07/05/2015   Procedure: Left Heart Cath and Coronary Angiography;  Surgeon: Leonie Man, MD;  Location: [place] [facility];  Service: Cardiovascular;  Laterality: N/A;   CHOLECYSTECTOMY  1989   collapse lung  1990   COLONOSCOPY WITH PROPOFOL N/A 05/14/2018   Procedure: COLONOSCOPY WITH PROPOFOL;  Surgeon: Irene Shipper, MD;  [facility];  Service: Endoscopy;  Laterality: N/A;   EXCISION MASS ABDOMINAL  2019   EXPLORATORY LAPAROTOMY  1969   LEFT HEART CATH AND CORONARY ANGIOGRAPHY N/A 11/19/2020   Procedure: LEFT HEART CATH AND CORONARY ANGIOGRAPHY;  Surgeon: [name], MD;  Location: [place] [facility];  Service: Cardiovascular;  Laterality: N/A;   POLYPECTOMY  05/14/2018   Procedure: POLYPECTOMY;  Surgeon: Irene Shipper, MD;  [facility];  Service: Endoscopy;;   SALPINGOOPHORECTOMY  2019   Patient Active Problem List   Diagnosis Date Noted   NSVT (nonsustained  ventricular tachycardia) ([place]) 10/11/2021   Hyperkalemia 09/22/2021   SOB (shortness of breath) 09/19/2021   Acute respiratory failure with hypoxia and hypercapnia ([place])    DNR (do not resuscitate) 09/17/2021   COPD with acute exacerbation ([place]) 08/26/2021   Double vision 08/26/2021   AKI (acute kidney injury) ([place]) 08/26/2021   Postconcussive syndrome 08/26/2021   Elevated troponin 08/26/2021   Subarachnoid hemorrhage ([place]) 08/26/2021   Syncope and collapse 08/26/2021   Heart failure with preserved ejection fraction ([place]) 08/26/2021   NSTEMI (non-ST elevated myocardial infarction) ([place]) 11/17/2020   Osteoporosis 06/05/2020   Chronic combined systolic (congestive) and diastolic (congestive) heart failure ([place]) 03/24/2020   Essential hypertension 03/24/2020   Vertebral fracture, osteoporotic ([place]) 03/05/2020   History of colonic polyps    Benign neoplasm of ascending colon    Neuritis 01/29/2017   Alpha-1-antitrypsin deficiency carrier 01/19/2017   Dependent edema 01/14/2017   Orthostatic hypotension    Allergic rhinitis 08/10/2015   Acute on chronic respiratory failure with hypoxia ([facility]) 07/05/2015   Hyperglycemia    HLD (hyperlipidemia)    Coronary artery calcification seen on CAT scan 07/02/2015   Solitary pulmonary nodule 12/02/2013   Loss of weight 11/10/2013   Osteoarthritis 06/05/2009   Brachial neuritis or radiculitis 06/05/2009   Dyslipidemia 03/12/2009   NEPHROLITHIASIS 01/05/2008   COPD GOLD III  08/31/2007   PARESTHESIA 08/24/2007   Anxiety and depression 11/19/2006  ONSET DATE: 10/04/21 (Referral Date)  REFERRING DIAG: R42 (ICD-10-CM) - Vertigo  THERAPY DIAG:  Dizziness and giddiness  BPPV (benign paroxysmal positional vertigo), right  Rationale for Evaluation and Treatment Rehabilitation  SUBJECTIVE:   SUBJECTIVE STATEMENT: Patient reports that initial episode occurred in February 2022. Had a sudden onset of lightheadedness and dizziness. Reports  that she has had episodes of passing out/syncope. In May 2023, reports she having syncope episodes where she passes out and wakes up on the ground. Patient reports they are not sure why these episodes are occurring. Reports lightheadedness with looking up at the cabinet. Reports does feel like it has gotten some better. Patient does have some dizziness with rolling over in bed. Patient reports she feels she can tell when the blood pressure is dropping. Patient reports she has 3-4 compression fractures.  Pt accompanied by: significant other  PERTINENT HISTORY: C8 Radiculopathy, COPD, Depression, Dyslipidemia, OA, Mitral Incompetence, Nephrolithiasis   PAIN:  Are you having pain? No  PRECAUTIONS: Fall and Other: On Oxygen  WEIGHT BEARING RESTRICTIONS No  FALLS: Has patient fallen in last 6 months?  Episodes of syncope  LIVING ENVIRONMENT: Lives with: lives with their spouse Lives in: House/apartment Has following equipment at home: Wheelchair (manual)  PLOF: Independent with household mobility without device and Independent with community mobility with device  PATIENT GOALS Improve Dizziness  OBJECTIVE:   COGNITION: Overall cognitive status: Within functional limits for tasks assessed   SENSATION: Not tested  POSTURE: rounded shoulders and forward head  STRENGTH: Decreased strength in BLE from general mobility; no formal measurements taken   TRANSFERS: Assistive device utilized: None  Sit to stand: SBA Stand to sit: SBA  PATIENT SURVEYS:  FOTO DPS: 43 and DFS: 41.4   VESTIBULAR ASSESSMENT    SYMPTOM BEHAVIOR:   Subjective history: See Subjective   Non-Vestibular symptoms:  syncopal episodes   Type of dizziness: Unsteady with head/body turns and Lightheadedness/Faint   Frequency: multiple times a week   Duration: minutes to    Aggravating factors: Induced by position change: rolling to the right, supine to sit, and sit to stand and Induced by motion: looking up at  the ceiling, bending down to the ground, and turning head quickly   Relieving factors: closing eyes   Progression of symptoms: better   OCULOMOTOR EXAM:   Ocular Alignment: normal   Ocular ROM: No Limitations   Spontaneous Nystagmus: absent   Gaze-Induced Nystagmus: absent   Smooth Pursuits: intact   Saccades: intact     VESTIBULAR - OCULAR REFLEX:    Slow VOR: Normal   VOR Cancellation: Normal      POSITIONAL TESTING: Right Dix-Hallpike: upbeating, right nystagmus Left Dix-Hallpike: no nystagmus Right Roll Test: no nystagmus Left Roll Test: no nystagmus    MOTION SENSITIVITY:    Motion Sensitivity Quotient  Intensity: 0 = none, 1 = Lightheaded, 2 = Mild, 3 = Moderate, 4 = Severe, 5 = Vomiting  Intensity  1. Sitting to supine 2  2. Supine to L side 0  3. Supine to R side 0  4. Supine to sitting 1  5. L Hallpike-Dix 0  6. Up from L  1  7. R Hallpike-Dix 2  8. Up from R  1  9. Sitting, head  tipped to L knee   10. Head up from L  knee   11. Sitting, head  tipped to R knee   12. Head up from R  knee   13. Sitting head turns x5  14.Sitting head nods x5   15. In stance, 180  turn to L    16. In stance, 180  turn to R      VESTIBULAR TREATMENT:  Canalith Repositioning:   Epley Right: Number of Reps: 1 and Response to Treatment: comment: unable to reassess due to time constraints  PATIENT EDUCATION: Education details: Educated on [employer] Person educated: Patient Education method: Explanation Education comprehension: verbalized understanding   GOALS: Goals reviewed with patient? Yes  SHORT TERM GOALS = LONG TERM GOALS  LONG TERM GOALS: Target date: 11/22/2021    Patient will demonstrate (-) positional testing to indicate resolution of BPPV Baseline: R BPPV Goal status: INITIAL  2.  Pt will verbalize understanding of self management of symptoms in case of reoccurrence Baseline: dependent  Goal status: INITIAL  3.  Pt will improve DPS  to >/= 55 Baseline: DPS: 43 Goal status: INITIAL   ASSESSMENT:  CLINICAL IMPRESSION: Patient is a 66 y.o. female referred to Neuro OPPT services for Vertigo. Patient's PMH significant for the following: C8 Radiculopathy, COPD, Depression, Dyslipidemia, OA, Mitral Incompetence, Nephrolithiasis . Upon evaluation, patient presents with the following impairments: dizziness/lightheadedness, syncope. Patient has normal oculomotor exam. With positional testing patient demonstrating R Upbeating Nystagmus of Short Duration indicating R posterior canal Canalithiasis. Completed CRM x 1 rep with pt tolerating well. Patient will benefit from skilled PT services to address impairments.    OBJECTIVE IMPAIRMENTS decreased activity tolerance, decreased balance, and dizziness.   ACTIVITY LIMITATIONS bending, bed mobility, reach over head, and locomotion level  PARTICIPATION LIMITATIONS: meal prep, cleaning, and community activity  PERSONAL FACTORS Age, Time since onset of injury/illness/exacerbation, and 3+ comorbidities: C8 Radiculopathy, COPD, Depression, Dyslipidemia, OA, Mitral Incompetence, Nephrolithiasis  are also affecting patient's functional outcome.   REHAB POTENTIAL: Good  CLINICAL DECISION MAKING: Stable/uncomplicated  EVALUATION COMPLEXITY: Low   PLAN: PT FREQUENCY: 2x/week  PT DURATION: 4 weeks  PLANNED INTERVENTIONS: Therapeutic exercises, Therapeutic activity, Neuromuscular re-education, Balance training, Gait training, Patient/Family education, Self Care, Joint mobilization, Stair training, Vestibular training, Canalith repositioning, DME instructions, Aquatic Therapy, Dry Needling, Manual therapy, and Re-evaluation  PLAN FOR NEXT SESSION: Reassess BPPV and treat as indicated   [employer], PT, DPT 10/21/2021, 3:09 PM

## 2021-10-22 LAB — BASIC METABOLIC PANEL
BUN/Creatinine Ratio: 20 (ref 12–28)
BUN: 18 mg/dL (ref 8–27)
CO2: 22 mmol/L (ref 20–29)
Calcium: 9.9 mg/dL (ref 8.7–10.3)
Chloride: 102 mmol/L (ref 96–106)
Creatinine, Ser: 0.92 mg/dL (ref 0.57–1.00)
Glucose: 112 mg/dL — ABNORMAL HIGH (ref 70–99)
Potassium: 4.8 mmol/L (ref 3.5–5.2)
Sodium: 141 mmol/L (ref 134–144)
eGFR: 68 mL/min/{1.73_m2} (ref >59)

## 2021-10-22 NOTE — Progress Notes (Signed)
Pt has been made aware of normal result and verbalized understanding.  jw

## 2021-10-25 ENCOUNTER — Ambulatory Visit: Payer: Medicare Other

## 2021-10-25 VITALS — BP 131/63 | HR 78

## 2021-10-25 DIAGNOSIS — J441 Chronic obstructive pulmonary disease with (acute) exacerbation: Secondary | ICD-10-CM

## 2021-10-25 DIAGNOSIS — R2681 Unsteadiness on feet: Secondary | ICD-10-CM

## 2021-10-25 DIAGNOSIS — R42 Dizziness and giddiness: Secondary | ICD-10-CM | POA: Diagnosis not present

## 2021-10-25 DIAGNOSIS — H8111 Benign paroxysmal vertigo, right ear: Secondary | ICD-10-CM

## 2021-10-25 DIAGNOSIS — I5032 Chronic diastolic (congestive) heart failure: Secondary | ICD-10-CM

## 2021-10-25 NOTE — Patient Instructions (Signed)
Visit Information Case closed declines further RNCM outreach Thank you for allowing me to share the care management and care coordination services that are available to you as part of your health plan and services through your primary care provider and medical home. Please reach out to me at [phone] if the care management/care coordination team may be of assistance to you in the future.   Peter Garter RN, Jackquline Denmark, CDE Care Management Coordinator [facility] [phone]

## 2021-10-25 NOTE — Therapy (Addendum)
OUTPATIENT PHYSICAL THERAPY VESTIBULAR TREATMENT NOTE   Patient Name: Kim [name] MRN: [id number] DOB:07-Nov-1955, 66 y.o., female Today's Date: 10/25/2021  PCP: [name], NP REFERRING PROVIDER: [name], NP   PT End of Session - 10/25/21 1315     Visit Number 2    Number of Visits 9    Date for PT Re-Evaluation 11/22/21    Authorization Type Medicare A+B/BCBS    PT Start Time 1316    PT Stop Time 1400    PT Time Calculation (min) 44 min    Activity Tolerance Patient tolerated treatment well    Behavior During Therapy [facility] for tasks assessed/performed             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   OSTEOARTHRITIS 06/05/2009   PARESTHESIA 08/24/2007   TOBACCO ABUSE 01/25/2010   Past Surgical History:  Procedure Laterality Date   ABDOMINAL HYSTERECTOMY     APPENDECTOMY  1969   CARDIAC CATHETERIZATION N/A 07/05/2015   Procedure: Left Heart Cath and Coronary Angiography;  Surgeon: Leonie Man, MD;  Location: [place] [facility];  Service: Cardiovascular;  Laterality: N/A;   CHOLECYSTECTOMY  1989   collapse lung  1990   COLONOSCOPY WITH PROPOFOL N/A 05/14/2018   Procedure: COLONOSCOPY WITH PROPOFOL;  Surgeon: Irene Shipper, MD;  Location: [facility];  Service: Endoscopy;  Laterality: N/A;   EXCISION MASS ABDOMINAL  2019   EXPLORATORY LAPAROTOMY  1969   LEFT HEART CATH AND CORONARY ANGIOGRAPHY N/A 11/19/2020   Procedure: LEFT HEART CATH AND CORONARY ANGIOGRAPHY;  Surgeon: [name], MD;  Location: [place] [facility];  Service: Cardiovascular;  Laterality: N/A;   POLYPECTOMY  05/14/2018   Procedure: POLYPECTOMY;  Surgeon: Irene Shipper, MD;  Location: [facility];  Service: Endoscopy;;   SALPINGOOPHORECTOMY  2019   Patient Active Problem List   Diagnosis Date Noted   NSVT (nonsustained  ventricular tachycardia) ([place]) 10/11/2021   Hyperkalemia 09/22/2021   SOB (shortness of breath) 09/19/2021   Acute respiratory failure with hypoxia and hypercapnia ([place])    DNR (do not resuscitate) 09/17/2021   COPD with acute exacerbation ([place]) 08/26/2021   Double vision 08/26/2021   AKI (acute kidney injury) ([place]) 08/26/2021   Postconcussive syndrome 08/26/2021   Elevated troponin 08/26/2021   Subarachnoid hemorrhage ([place]) 08/26/2021   Syncope and collapse 08/26/2021   Heart failure with preserved ejection fraction ([place]) 08/26/2021   NSTEMI (non-ST elevated myocardial infarction) ([place]) 11/17/2020   Osteoporosis 06/05/2020   Chronic combined systolic (congestive) and diastolic (congestive) heart failure ([place]) 03/24/2020   Essential hypertension 03/24/2020   Vertebral fracture, osteoporotic ([place]) 03/05/2020   History of colonic polyps    Benign neoplasm of ascending colon    Neuritis 01/29/2017   Alpha-1-antitrypsin deficiency carrier 01/19/2017   Dependent edema 01/14/2017   Orthostatic hypotension    Allergic rhinitis 08/10/2015   Acute on chronic respiratory failure with hypoxia (HCC) 07/05/2015   Hyperglycemia    HLD (hyperlipidemia)    Coronary artery calcification seen on CAT scan 07/02/2015   Solitary pulmonary nodule 12/02/2013   Loss of weight 11/10/2013   Osteoarthritis 06/05/2009   Brachial neuritis or radiculitis 06/05/2009   Dyslipidemia 03/12/2009   NEPHROLITHIASIS 01/05/2008   COPD GOLD III  08/31/2007   PARESTHESIA 08/24/2007   Anxiety and depression 11/19/2006  ONSET DATE: 10/04/21 (Referral Date)  REFERRING DIAG: R42 (ICD-10-CM) - Vertigo  THERAPY DIAG:  Dizziness and giddiness  BPPV (benign paroxysmal positional vertigo), right  Unsteadiness on feet  Rationale for Evaluation and Treatment Rehabilitation  PERTINENT HISTORY: C8 Radiculopathy, COPD, Depression, Dyslipidemia, OA, Mitral Incompetence, Nephrolithiasis    PRECAUTIONS: Fall and  Other: On Oxygen  SUBJECTIVE: Patient reports felt dizzy on Tuesday. Reports had an episode. Patient reports that since this episode has been feeling somewhat better. No other new changes.  PAIN:  Are you having pain? No  OBJECTIVE:  PT educating on potential for Silver Sneakers to engage in aerobic/endurance activities.   POSITIONAL TESTS:  Right Dix-Hallpike: no nystagmus Left Dix-Hallpike: no nystagmus   VESTIBULAR TREATMENT:  Canalith Repositioning: Comment: no repositioning maneuvers needed due to resolution of nystagmus     Other: PT educating on home maneuver of [place]. PT demonstrating and providing verbal education and handout to patient and spouse. Verbalized understanding.   [facility] PT Assessment - 10/25/21 0001       Standardized Balance Assessment   Standardized Balance Assessment Berg Balance Test      Berg Balance Test   Sit to Stand Able to stand  independently using hands    Standing Unsupported Able to stand safely 2 minutes    Sitting with Back Unsupported but Feet Supported on Floor or Stool Able to sit safely and securely 2 minutes    Stand to Sit Controls descent by using hands    Transfers Able to transfer safely, minor use of hands    Standing Unsupported with Eyes Closed Able to stand 10 seconds with supervision    Standing Unsupported with Feet Together Able to place feet together independently and stand for 1 minute with supervision    From Standing, Reach Forward with Outstretched Arm Can reach forward >5 cm safely (2")    From Standing Position, Pick up Object from [place] to pick up shoe safely and easily    From Standing Position, Turn to Look Behind Over each Shoulder Turn sideways only but maintains balance    Turn 360 Degrees Able to turn 360 degrees safely but slowly    Standing Unsupported, Alternately Place Feet on Step/Stool Able to stand independently and complete 8 steps >20 seconds    Standing Unsupported, One Foot in Front Needs help  to step but can hold 15 seconds    Standing on One Leg Able to lift leg independently and hold equal to or more than 3 seconds    Total Score 40    Berg comment: after completion of Berg: Sp02: 93%, HR: 95, BP: 138/65             PATIENT EDUCATION: Education details: Educated on BPPV Home Maneuver; Oceanographer Results  Person educated: Patient Education method: Explanation Education comprehension: verbalized understanding     GOALS: Goals reviewed with patient? Yes   SHORT TERM GOALS = LONG TERM GOALS   LONG TERM GOALS: Target date: 11/22/2021     Patient will demonstrate (-) positional testing to indicate resolution of BPPV Baseline: R BPPV;  Goal status: INITIAL   2.  Pt will verbalize understanding of self management of symptoms in case of reoccurrence Baseline: dependent  Goal status: INITIAL   3.  Pt will improve DPS to >/= 55 Baseline: DPS: 43 Goal status: INITIAL  4. Pt will improve Berg Balance to >/= 45/56 to demonstrate improved standing balance and reduced fall risk Baseline: 40/56 Goal status: INITIAL  ASSESSMENT:   CLINICAL IMPRESSION: Today's skilled PT session included reassessment of BPPV, patient demonstrating no nystagmus/symptoms with positional testing indicating resolution of R BPPV. Due to continued imbalance completed balance reassessment with Merrilee Jansky, patient scoring 40/56 demonstrating increased fall risk. Will continue per POC to focus on dizziness and decreased balance.      OBJECTIVE IMPAIRMENTS decreased activity tolerance, decreased balance, and dizziness.    ACTIVITY LIMITATIONS bending, bed mobility, reach over head, and locomotion level   PARTICIPATION LIMITATIONS: meal prep, cleaning, and community activity   PERSONAL FACTORS Age, Time since onset of injury/illness/exacerbation, and 3+ comorbidities: C8 Radiculopathy, COPD, Depression, Dyslipidemia, OA, Mitral Incompetence, Nephrolithiasis  are also affecting patient's functional  outcome.    REHAB POTENTIAL: Good   CLINICAL DECISION MAKING: Stable/uncomplicated   EVALUATION COMPLEXITY: Low     PLAN: PT FREQUENCY: 2x/week   PT DURATION: 4 weeks   PLANNED INTERVENTIONS: Therapeutic exercises, Therapeutic activity, Neuromuscular re-education, Balance training, Gait training, Patient/Family education, Self Care, Joint mobilization, Stair training, Vestibular training, Canalith repositioning, DME instructions, Aquatic Therapy, Dry Needling, Manual therapy, and Re-evaluation   PLAN FOR NEXT SESSION: Trial NuStep/SciFit and Monitor Vitals. Initiate Balance HEP. Treat vertigo as needed.    Kirtland Bouchard, PT, DPT 10/25/2021, 2:30 PM

## 2021-10-25 NOTE — Chronic Care Management (AMB) (Signed)
Care Management    RN Visit Note  10/25/2021 Name: Kim [name] MRN: [id number] DOB: [birth year]  Subjective: Kim [name] is a 66 y.o. year old female who is a primary care patient of Kim Peng, NP. The care management team was consulted for assistance with disease management and care coordination needs.    Engaged with patient by telephone for follow up visit in response to provider referral for case management and/or care coordination services.   Consent to Services:   Kim [name] was given information about Care Management services today including:  Care Management services includes personalized support from designated clinical staff supervised by her physician, including individualized plan of care and coordination with other care providers 24/7 contact phone numbers for assistance for urgent and routine care needs. The patient may stop case management services at any time by phone call to the office staff.  Patient agreed to services and consent obtained.   Assessment: Review of patient past medical history, allergies, medications, health status, including review of consultants reports, laboratory and other test data, was performed as part of comprehensive evaluation and provision of chronic care management services.   SDOH (Social Determinants of Health) assessments and interventions performed:    Care Plan  Allergies  Allergen Reactions   Tape Other (See Comments)    Skin tears easily, peels skin off   Daliresp [Roflumilast]     Skin peeling   Latex Other (See Comments)    Peels skin off   Lisinopril Cough   Morphine And Related     hallucinations   Penicillins Hives and Other (See Comments)    Tolerates Omnicef Has patient had a PCN reaction causing immediate rash, facial/tongue/throat swelling, SOB or lightheadedness with hypotension: Yes Has patient had a PCN reaction causing severe rash involving mucus membranes or skin necrosis: No Has patient had a  PCN reaction that required hospitalization No Has patient had a PCN reaction occurring within the last 10 years: No If all of the above answers are "NO", then may proceed with Cephalosporin use.    Statins Other (See Comments)    Myalgia    Omnicef [Cefdinir] Rash    Outpatient Encounter Medications as of 10/25/2021  Medication Sig   acetaminophen (TYLENOL) 500 MG tablet Take 500 mg by mouth 2 (two) times daily as needed for headache.   albuterol (VENTOLIN HFA) 108 (90 Base) MCG/ACT inhaler INHALE TWO PUFFS INTO LUNGS EVERY 4 TO 6 HOURS AS NEEDED FOR SHORTNESS OF BREATH/WHEEZING. (Patient taking differently: Inhale 2 puffs into the lungs every 4 (four) hours as needed for wheezing or shortness of breath.)   aspirin EC 81 MG EC tablet Take 1 tablet (81 mg total) by mouth daily. (Patient taking differently: Take 81 mg by mouth 3 days.)   bisoprolol (ZEBETA) 5 MG tablet Take 0.5 tablets (2.5 mg total) by mouth daily as needed (Take HALF tablet by mouth for break through chest pain as needed).   Calcium Carb-Cholecalciferol (CALCIUM 600 + D PO) Take 600 mg by mouth in the morning and at bedtime. Calcium 600 mg + 4000 unit D3   cetirizine (ZYRTEC) 10 MG tablet Take 10 mg by mouth daily.   Cholecalciferol (VITAMIN D3 PO) Take 5,000 Units by mouth daily.   ezetimibe (ZETIA) 10 MG tablet Take 1 tablet (10 mg total) by mouth daily.   furosemide (LASIX) 40 MG tablet Take 1 tablet (40 mg total) by mouth daily as needed (for an overnight weight gain of 3-5  pounds of fluid).   Ginkgo Biloba Extract 60 MG CAPS Take 1 capsule by mouth daily.   HYDROcodone-acetaminophen (NORCO) 7.5-325 MG tablet Take 1 tablet by mouth every 6 (six) hours as needed for moderate pain.   HYDROcodone-acetaminophen (NORCO) 7.5-325 MG tablet Take 1 tablet by mouth every 6 (six) hours as needed for moderate pain.   HYDROcodone-acetaminophen (NORCO) 7.5-325 MG tablet Take 1 tablet by mouth every 6 (six) hours as needed for moderate  pain.   ipratropium-albuterol (DUONEB) 0.5-2.5 (3) MG/3ML SOLN USE 3 ML VIA NEBULIZER EVERY 6 HOURS AS NEEDED.  DX: J44.9 (Patient taking differently: Take 3 mLs by nebulization. 3 ml nebulization five times a day as needed for shortness of breath)   lidocaine (LIDODERM) 5 % Place 1 patch onto the skin daily. Remove & Discard patch within 12 hours or as directed by MD (Patient taking differently: Place 1 patch onto the skin every other day. Remove & Discard patch within 12 hours or as directed by MD)   losartan (COZAAR) 25 MG tablet Take 1 tablet (25 mg total) by mouth daily.   magnesium oxide (MAG-OX) 400 MG tablet Take 400 mg by mouth daily.   MUCINEX MAXIMUM STRENGTH 1200 MG TB12 Take 1,200 mg by mouth every 12 (twelve) hours.   nitroGLYCERIN (NITROSTAT) 0.4 MG SL tablet Place 1 tablet (0.4 mg total) under the tongue every 5 (five) minutes as needed for chest pain.   nystatin (MYCOSTATIN) 100000 UNIT/ML suspension Take 5 mLs by mouth 3 (three) times daily as needed (thrush).   Olopatadine HCl (PATADAY OP) Place 1 drop into the left eye daily.   ondansetron (ZOFRAN) 4 MG tablet Take 1 tablet (4 mg total) by mouth every 8 (eight) hours as needed for nausea or vomiting.   OXYGEN Inhale 2 L/min into the lungs continuous.   PARoxetine (PAXIL) 40 MG tablet Take 1 tablet (40 mg total) by mouth every morning.   potassium chloride (KLOR-CON) 10 MEQ tablet Take 1 tablet (10 mEq total) by mouth as needed (take with furosemide (lasix)).   predniSONE (DELTASONE) 10 MG tablet Take 1 tablet (10 mg total) by mouth daily.   Spacer/Aero Chamber Mouthpiece MISC 1 Device by Does not apply route as directed.   spironolactone (ALDACTONE) 25 MG tablet Take 0.5 tablets (12.5 mg total) by mouth daily.   SYMBICORT 160-4.5 MCG/ACT inhaler INHALE 2 PUFFS TWICE DAILY (Patient taking differently: 2 puffs 2 (two) times daily.)   Tiotropium Bromide Monohydrate (SPIRIVA RESPIMAT) 2.5 MCG/ACT AERS Inhale 2 puffs into the lungs  daily. (Patient taking differently: Inhale 2 puffs into the lungs daily as needed (wheezing).)   No facility-administered encounter medications on file as of 10/25/2021.    Patient Active Problem List   Diagnosis Date Noted   NSVT (nonsustained ventricular tachycardia) (HCC) 10/11/2021   Hyperkalemia 09/22/2021   SOB (shortness of breath) 09/19/2021   Acute respiratory failure with hypoxia and hypercapnia (HCC)    DNR (do not resuscitate) 09/17/2021   COPD with acute exacerbation ([place]) 08/26/2021   Double vision 08/26/2021   AKI (acute kidney injury) ([place]) 08/26/2021   Postconcussive syndrome 08/26/2021   Elevated troponin 08/26/2021   Subarachnoid hemorrhage ([place]) 08/26/2021   Syncope and collapse 08/26/2021   Heart failure with preserved ejection fraction ([place]) 08/26/2021   NSTEMI (non-ST elevated myocardial infarction) ([place]) 11/17/2020   Osteoporosis 06/05/2020   Chronic combined systolic (congestive) and diastolic (congestive) heart failure ([place]) 03/24/2020   Essential hypertension 03/24/2020   Vertebral fracture, osteoporotic ([place])  03/05/2020   History of colonic polyps    Benign neoplasm of ascending colon    Neuritis 01/29/2017   Alpha-1-antitrypsin deficiency carrier 01/19/2017   Dependent edema 01/14/2017   Orthostatic hypotension    Allergic rhinitis 08/10/2015   Acute on chronic respiratory failure with hypoxia (HCC) 07/05/2015   Hyperglycemia    HLD (hyperlipidemia)    Coronary artery calcification seen on CAT scan 07/02/2015   Solitary pulmonary nodule 12/02/2013   Loss of weight 11/10/2013   Osteoarthritis 06/05/2009   Brachial neuritis or radiculitis 06/05/2009   Dyslipidemia 03/12/2009   NEPHROLITHIASIS 01/05/2008   COPD GOLD III  08/31/2007   PARESTHESIA 08/24/2007   Anxiety and depression 11/19/2006    Conditions to be addressed/monitored: CHF, HTN, HLD, and COPD  Care Plan : RN Care Manager Plan of Care  Updates made by Dimitri Ped, RN since  10/25/2021 12:00 AM  Completed 10/25/2021   Problem: Disease Management and Care Coordination Needs(COPD, CHF, HTN, hx falls) Resolved 10/25/2021  Priority: High     Long-Range Goal: Establish Plan of Care for Care Coordination and Disease Management Needs(COPD, CHF, HTN, hx falls) Completed 10/25/2021  Start Date: 09/10/2021  Expected End Date: 03/09/2022  Priority: High  Note:   Case closed declines further RNCM outreach Current Barriers:  Knowledge Deficits related to plan of care for management of CHF, HTN, COPD, and Hx of falls  Care Coordination needs related to ADL IADL limitations No Advanced Directives in place Pt states she still is having dizziness and she is starting her PT today to help.  States her breathing has improved with the BiPAP at night.  States she wears her O2 at 2L/min during the day.   States she weights daily and will take a Lasix if her weight goes up as she has been directed to do.  States she now has a new walker and potty chair.   States she is going to doctors frequently and she no longer feels she needs any further RNCM outreach RNCM Clinical Goal(s):  Patient will verbalize understanding of plan for management of CHF, HTN, COPD, and Hx of falls  as evidenced by voiced adherence to plan of care verbalize basic understanding of  CHF, HTN, COPD, and Hx of falls  disease process and self health management plan as evidenced by voiced understanding and teach back  take all medications exactly as prescribed and will call provider for medication related questions as evidenced by dispense report and pt verbalization  attend all scheduled medical appointments: Cardiology 01/15/22, Pulmonary 11/05/21 as evidenced by medical records demonstrate Improved adherence to prescribed treatment plan for CHF, HTN, COPD, and Hx of falls  as evidenced by readings within limits, voiced adherence to plan of care continue to work with RN Care Manager to address care management and care  coordination needs related to  CHF, HTN, COPD, and Hx of falls  as evidenced by adherence to CM Team Scheduled appointments not experience hospital admission as evidenced by review of EMR. Hospital Admissions in last 6 months = 2  through collaboration with RN Care manager, provider, and care team.   Interventions: 1:1 collaboration with primary care provider regarding development and update of comprehensive plan of care as evidenced by provider attestation and co-signature Inter-disciplinary care team collaboration (see longitudinal plan of care) Evaluation of current treatment plan related to  self management and patient's adherence to plan as established by provider Communicated to provider need for walker and shower chair-now has equipment  Heart Failure Interventions:  (Status:  Patient declined further engagement on this goal.) Long Term Goal Basic overview and discussion of pathophysiology of Heart Failure reviewed Provided education on low sodium diet Reviewed Heart Failure Action Plan in depth and provided written copy Assessed need for readable accurate scales in home Provided education about placing scale on hard, flat surface Advised patient to weigh each morning after emptying bladder Discussed importance of daily weight and advised patient to weigh and record daily Discussed the importance of keeping all appointments with provider  COPD Interventions:  (Status:  Patient declined further engagement on this goal.) Long Term Goal Provided patient with basic written and verbal COPD education on self care/management/and exacerbation prevention Provided instruction about proper use of medications used for management of COPD including inhalers Advised patient to self assesses COPD action plan zone and make appointment with provider if in the yellow zone for 48 hours without improvement Screening for signs and symptoms of depression related to chronic disease state    Falls  Interventions:  (Status:  New goal. and Goal Met.) Long Term Goal Provided written and verbal education re: potential causes of falls and Fall prevention strategies Reviewed medications and discussed potential side effects of medications such as dizziness and frequent urination Advised patient of importance of notifying provider of falls Assessed for signs and symptoms of orthostatic hypotension Reviewed to keep appointments for vestibular therapy   Hypertension Interventions:  (Status:  Goal Met.) Long Term Goal Last practice recorded BP readings:  BP Readings from Last 3 Encounters:  10/21/21 (!) 104/54  10/15/21 120/62  10/11/21 116/62  Most recent eGFR/CrCl:  Lab Results  Component Value Date   EGFR 70 12/21/2020    No components found for: CRCL  Evaluation of current treatment plan related to hypertension self management and patient's adherence to plan as established by provider Reviewed medications with patient and discussed importance of compliance Counseled on the importance of exercise goals with target of 150 minutes per week Discussed plans with patient for ongoing care management follow up and provided patient with direct contact information for care management team  Patient Goals/Self-Care Activities: Take all medications as prescribed Attend all scheduled provider appointments Call pharmacy for medication refills 3-7 days in advance of running out of medications Call provider office for new concerns or questions  call office if I gain more than 2 pounds in one day or 5 pounds in one week use salt in moderation watch for swelling in feet, ankles and legs every day weigh myself daily follow rescue plan if symptoms flare-up eat more whole grains, fruits and vegetables, lean meats and healthy fats identify and avoid work-related triggers identify and remove indoor air pollutants limit outdoor activity during cold weather listen for public air quality announcements  every day develop a rescue plan check blood pressure weekly write blood pressure results in a log or diary take blood pressure log to all doctor appointments call doctor for signs and symptoms of high blood pressure  Follow Up Plan:  The patient has been provided with contact information for the care management team and has been advised to call with any health related questions or concerns.  No further follow up required: Case closed declines further RNCM outreach       Plan: The patient has been provided with contact information for the care management team and has been advised to call with any health related questions or concerns.  No further follow up required: Case closed declines further  RNCM outreach  Peter Garter RN, [facility], CDE Care Management Coordinator [facility] [phone]

## 2021-10-26 ENCOUNTER — Other Ambulatory Visit: Payer: Self-pay | Admitting: Cardiovascular Disease

## 2021-10-29 ENCOUNTER — Ambulatory Visit: Payer: Medicare Other

## 2021-10-29 VITALS — BP 125/65 | HR 72

## 2021-10-29 DIAGNOSIS — R42 Dizziness and giddiness: Secondary | ICD-10-CM | POA: Diagnosis not present

## 2021-10-29 DIAGNOSIS — R2681 Unsteadiness on feet: Secondary | ICD-10-CM

## 2021-10-29 DIAGNOSIS — H8111 Benign paroxysmal vertigo, right ear: Secondary | ICD-10-CM

## 2021-10-29 NOTE — Therapy (Signed)
OUTPATIENT PHYSICAL THERAPY VESTIBULAR TREATMENT NOTE   Patient Name: Kim [name] MRN: [id number] DOB:[birth year], 66 y.o., female Today's Date: 10/29/2021  PCP: [name], NP REFERRING PROVIDER: [name], NP   PT End of Session - 10/29/21 1314     Visit Number 3    Number of Visits 9    Date for PT Re-Evaluation 11/22/21    Authorization Type Medicare A+B/BCBS    PT Start Time 1315    PT Stop Time 6144    PT Time Calculation (min) 43 min    Activity Tolerance Patient tolerated treatment well    Behavior During Therapy [facility] for tasks assessed/performed              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   OSTEOARTHRITIS 06/05/2009   PARESTHESIA 08/24/2007   TOBACCO ABUSE 01/25/2010   Past Surgical History:  Procedure Laterality Date   ABDOMINAL HYSTERECTOMY     APPENDECTOMY  1969   CARDIAC CATHETERIZATION N/A 07/05/2015   Procedure: Left Heart Cath and Coronary Angiography;  Surgeon: Leonie Man, MD;  Location: [place] [facility];  Service: Cardiovascular;  Laterality: N/A;   CHOLECYSTECTOMY  1989   collapse lung  1990   COLONOSCOPY WITH PROPOFOL N/A 05/14/2018   Procedure: COLONOSCOPY WITH PROPOFOL;  Surgeon: Irene Shipper, MD;  [facility];  Service: Endoscopy;  Laterality: N/A;   EXCISION MASS ABDOMINAL  2019   EXPLORATORY LAPAROTOMY  1969   LEFT HEART CATH AND CORONARY ANGIOGRAPHY N/A 11/19/2020   Procedure: LEFT HEART CATH AND CORONARY ANGIOGRAPHY;  Surgeon: [name], MD;  Location: [place] [facility];  Service: Cardiovascular;  Laterality: N/A;   POLYPECTOMY  05/14/2018   Procedure: POLYPECTOMY;  Surgeon: Irene Shipper, MD;  [facility];  Service: Endoscopy;;   SALPINGOOPHORECTOMY  2019   Patient Active Problem List   Diagnosis Date Noted   NSVT (nonsustained  ventricular tachycardia) ([place]) 10/11/2021   Hyperkalemia 09/22/2021   SOB (shortness of breath) 09/19/2021   Acute respiratory failure with hypoxia and hypercapnia ([place])    DNR (do not resuscitate) 09/17/2021   COPD with acute exacerbation ([place]) 08/26/2021   Double vision 08/26/2021   AKI (acute kidney injury) ([place]) 08/26/2021   Postconcussive syndrome 08/26/2021   Elevated troponin 08/26/2021   Subarachnoid hemorrhage ([place]) 08/26/2021   Syncope and collapse 08/26/2021   Heart failure with preserved ejection fraction ([place]) 08/26/2021   NSTEMI (non-ST elevated myocardial infarction) ([place]) 11/17/2020   Osteoporosis 06/05/2020   Chronic combined systolic (congestive) and diastolic (congestive) heart failure ([place]) 03/24/2020   Essential hypertension 03/24/2020   Vertebral fracture, osteoporotic ([place]) 03/05/2020   History of colonic polyps    Benign neoplasm of ascending colon    Neuritis 01/29/2017   Alpha-1-antitrypsin deficiency carrier 01/19/2017   Dependent edema 01/14/2017   Orthostatic hypotension    Allergic rhinitis 08/10/2015   Acute on chronic respiratory failure with hypoxia (HCC) 07/05/2015   Hyperglycemia    HLD (hyperlipidemia)    Coronary artery calcification seen on CAT scan 07/02/2015   Solitary pulmonary nodule 12/02/2013   Loss of weight 11/10/2013   Osteoarthritis 06/05/2009   Brachial neuritis or radiculitis 06/05/2009   Dyslipidemia 03/12/2009   NEPHROLITHIASIS 01/05/2008   COPD GOLD III  08/31/2007   PARESTHESIA 08/24/2007   Anxiety and depression 11/19/2006  ONSET DATE: 10/04/21 (Referral Date)  REFERRING DIAG: R42 (ICD-10-CM) - Vertigo  THERAPY DIAG:  Dizziness and giddiness  BPPV (benign paroxysmal positional vertigo), right  Unsteadiness on feet  Rationale for Evaluation and Treatment Rehabilitation  PERTINENT HISTORY: C8 Radiculopathy, COPD, Depression, Dyslipidemia, OA, Mitral Incompetence, Nephrolithiasis    PRECAUTIONS: Fall and  Other: On Oxygen  SUBJECTIVE: Being doing good. Reports only two episodes of dizziness but reports they are lightheaded more so than spinning. No falls.   PAIN:  Are you having pain? No Vitals:   10/29/21 1319  BP: 125/65  Pulse: 72  SpO2: 97%    OBJECTIVE:  Completed SciFit with BUE/BLE on Level 2.5 x 3 minutes on, then one minute rest break with vitals monitored. HR: 83, SpO2: 94%. Vitals improved to baseline with approx 1 minute rest break. Then completed additional 3 minutes with Sp02: 92%, HR: 85. Extended seated rest break required approx 2 minutes. Then completed last bout x 3 minutes.Educated on proper completion at gym/[employer] and monitoring vital response. Pt vitals doing well. SOB rated 5/10 at end of completion, RPE: 5/10. Seated rest break required after completion. BP after completion: 114/60.   Completed the following balance activities and established initial HEP:   Access Code: [email or web address] URL: [email or web address] Date: 10/29/2021 Prepared by: Baldomero Lamy  Exercises - Romberg Stance with Head Nods  - 1 x daily - 5 x weekly - 1 sets - 10 reps - Romberg Stance with Eyes Closed  - 1 x daily - 5 x weekly - 1 sets - 3 reps - 30 seconds hold - Sit to Stand with Arms Crossed  - 1 x daily - 5 x weekly - 2 sets - 10 reps - educated on proper rest break.    PATIENT EDUCATION: Education details: Leisure centre manager; Starting level on Nustep/Scifit at gym Person educated: Patient Education method: Explanation Education comprehension: verbalized understanding     HOME EXERCISE PROGRAM:   Access Code: [email or web address]  GOALS: Goals reviewed with patient? Yes   SHORT TERM GOALS = LONG TERM GOALS   LONG TERM GOALS: Target date: 11/22/2021     Patient will demonstrate (-) positional testing to indicate resolution of BPPV Baseline: R BPPV;  Goal status: INITIAL   2.  Pt will verbalize understanding of self management of symptoms in case of reoccurrence Baseline:  dependent  Goal status: INITIAL   3.  Pt will improve DPS to >/= 55 Baseline: DPS: 43 Goal status: INITIAL  4. Pt will improve Berg Balance to >/= 45/56 to demonstrate improved standing balance and reduced fall risk Baseline: 40/56 Goal status: INITIAL     ASSESSMENT:   CLINICAL IMPRESSION: Today's skilled PT session included focus on endurance training on SciFit, vitals staying good and return to baseline quickly with seated rest break. Educated on proper completion at gym. Rest of session spent establishing initial HEP , patient tolerating well with intermittent rest breaks due to fatigue and SOB. Will continue per POC.      OBJECTIVE IMPAIRMENTS decreased activity tolerance, decreased balance, and dizziness.    ACTIVITY LIMITATIONS bending, bed mobility, reach over head, and locomotion level   PARTICIPATION LIMITATIONS: meal prep, cleaning, and community activity   PERSONAL FACTORS Age, Time since onset of injury/illness/exacerbation, and 3+ comorbidities: C8 Radiculopathy, COPD, Depression, Dyslipidemia, OA, Mitral Incompetence, Nephrolithiasis  are also affecting patient's functional outcome.    REHAB POTENTIAL: Good   CLINICAL DECISION MAKING: Stable/uncomplicated   EVALUATION COMPLEXITY: Low     PLAN: PT  FREQUENCY: 2x/week   PT DURATION: 4 weeks   PLANNED INTERVENTIONS: Therapeutic exercises, Therapeutic activity, Neuromuscular re-education, Balance training, Gait training, Patient/Family education, Self Care, Joint mobilization, Stair training, Vestibular training, Canalith repositioning, DME instructions, Aquatic Therapy, Dry Needling, Manual therapy, and Re-evaluation   PLAN FOR NEXT SESSION: Continue NuStep/SciFit and Monitor Vitals. Building gait tolerance for longer distance.Continue balance training. Treat vertigo as needed.    Kirtland Bouchard, PT, DPT 10/29/2021, 2:00 PM

## 2021-11-01 ENCOUNTER — Ambulatory Visit: Payer: Medicare Other

## 2021-11-01 VITALS — BP 131/55 | HR 81

## 2021-11-01 DIAGNOSIS — H8111 Benign paroxysmal vertigo, right ear: Secondary | ICD-10-CM

## 2021-11-01 DIAGNOSIS — R42 Dizziness and giddiness: Secondary | ICD-10-CM | POA: Diagnosis not present

## 2021-11-01 DIAGNOSIS — R2681 Unsteadiness on feet: Secondary | ICD-10-CM

## 2021-11-01 NOTE — Therapy (Signed)
OUTPATIENT PHYSICAL THERAPY VESTIBULAR TREATMENT NOTE   Patient Name: Kim [name] MRN: [id number] DOB:1955/07/26, 66 y.o., female Today's Date: 11/01/2021  PCP: [name], NP REFERRING PROVIDER: [name], NP   PT End of Session - 11/01/21 1315     Visit Number 4    Number of Visits 9    Date for PT Re-Evaluation 11/22/21    Authorization Type Medicare A+B/BCBS    PT Start Time 1315    PT Stop Time 4235    PT Time Calculation (min) 44 min    Activity Tolerance Patient tolerated treatment well    Behavior During Therapy [facility] for tasks assessed/performed              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   OSTEOARTHRITIS 06/05/2009   PARESTHESIA 08/24/2007   TOBACCO ABUSE 01/25/2010   Past Surgical History:  Procedure Laterality Date   ABDOMINAL HYSTERECTOMY     APPENDECTOMY  1969   CARDIAC CATHETERIZATION N/A 07/05/2015   Procedure: Left Heart Cath and Coronary Angiography;  Surgeon: Leonie Man, MD;  Location: [place] [facility];  Service: Cardiovascular;  Laterality: N/A;   CHOLECYSTECTOMY  1989   collapse lung  1990   COLONOSCOPY WITH PROPOFOL N/A 05/14/2018   Procedure: COLONOSCOPY WITH PROPOFOL;  Surgeon: Irene Shipper, MD;  [facility];  Service: Endoscopy;  Laterality: N/A;   EXCISION MASS ABDOMINAL  2019   EXPLORATORY LAPAROTOMY  1969   LEFT HEART CATH AND CORONARY ANGIOGRAPHY N/A 11/19/2020   Procedure: LEFT HEART CATH AND CORONARY ANGIOGRAPHY;  Surgeon: [name], MD;  Location: [place] [facility];  Service: Cardiovascular;  Laterality: N/A;   POLYPECTOMY  05/14/2018   Procedure: POLYPECTOMY;  Surgeon: Irene Shipper, MD;  [facility];  Service: Endoscopy;;   SALPINGOOPHORECTOMY  2019   Patient Active Problem List   Diagnosis Date Noted   NSVT (nonsustained  ventricular tachycardia) ([place]) 10/11/2021   Hyperkalemia 09/22/2021   SOB (shortness of breath) 09/19/2021   Acute respiratory failure with hypoxia and hypercapnia ([place])    DNR (do not resuscitate) 09/17/2021   COPD with acute exacerbation ([place]) 08/26/2021   Double vision 08/26/2021   AKI (acute kidney injury) ([place]) 08/26/2021   Postconcussive syndrome 08/26/2021   Elevated troponin 08/26/2021   Subarachnoid hemorrhage ([place]) 08/26/2021   Syncope and collapse 08/26/2021   Heart failure with preserved ejection fraction ([place]) 08/26/2021   NSTEMI (non-ST elevated myocardial infarction) ([place]) 11/17/2020   Osteoporosis 06/05/2020   Chronic combined systolic (congestive) and diastolic (congestive) heart failure ([place]) 03/24/2020   Essential hypertension 03/24/2020   Vertebral fracture, osteoporotic ([place]) 03/05/2020   History of colonic polyps    Benign neoplasm of ascending colon    Neuritis 01/29/2017   Alpha-1-antitrypsin deficiency carrier 01/19/2017   Dependent edema 01/14/2017   Orthostatic hypotension    Allergic rhinitis 08/10/2015   Acute on chronic respiratory failure with hypoxia (HCC) 07/05/2015   Hyperglycemia    HLD (hyperlipidemia)    Coronary artery calcification seen on CAT scan 07/02/2015   Solitary pulmonary nodule 12/02/2013   Loss of weight 11/10/2013   Osteoarthritis 06/05/2009   Brachial neuritis or radiculitis 06/05/2009   Dyslipidemia 03/12/2009   NEPHROLITHIASIS 01/05/2008   COPD GOLD III  08/31/2007   PARESTHESIA 08/24/2007   Anxiety and depression 11/19/2006  ONSET DATE: 10/04/21 (Referral Date)  REFERRING DIAG: R42 (ICD-10-CM) - Vertigo  THERAPY DIAG:  Dizziness and giddiness  BPPV (benign paroxysmal positional vertigo), right  Unsteadiness on feet  Rationale for Evaluation and Treatment Rehabilitation  PERTINENT HISTORY: C8 Radiculopathy, COPD, Depression, Dyslipidemia, OA, Mitral Incompetence, Nephrolithiasis   PRECAUTIONS: Fall and Other:  On Oxygen  SUBJECTIVE: Reports that having a bad breathing day today. Reports no spinning. Occasional lightheadedness but no spinning. Was slightly sore after last session.   PAIN:  Are you having pain? No  Vitals:   11/01/21 1325  BP: (!) 131/55  Pulse: 81     OBJECTIVE:  Completed SciFit with BUE/BLE on Level 2.5 x 3 minutes on with patient able to maintain conversation throughout completion. One minute seated, with the following vitals: HR: 87, SpO2: 94%. Then completed  another bout x 4 minutes, patient tolerating increase in time well. HR: , SpO2: 93%, HR: 89. Short seated rest break and completed additional bout x 2 minutes. SpO2: 94%, HR: 90, BP: 122/52.    Standing Balance: Surface: Airex Position: Feet Hip Width Apart Completed with: Eyes Closed; 2 x 30 seconds; CGA  On Airex: Completed alternating toe taps to cones, completed x 10 reps with BUE support, x 5 with single UE, then x 10 reps with no UE support. CGA Cues for relaxation and breathing to promote improvement relaxation/tolerance for activities.   Standing on Rockerboard (positioned A/P): completed horizontal/vertical head turns x 10 reps each direction, vertical > horizontal. Cues for proper weight shift. Then completed eyes closed 2 x 30 seconds. Intermittent seated rest break due to fatigue. CGA  Completed lateral side stepping on blue balance beam: completed x 2 laps down and back without UE support. CGA from PT.     PATIENT EDUCATION: Education details: Continue HEP Person educated: Patient Education method: Explanation Education comprehension: verbalized understanding     HOME EXERCISE PROGRAM:   Access Code: [email or web address]  GOALS: Goals reviewed with patient? Yes   SHORT TERM GOALS = LONG TERM GOALS   LONG TERM GOALS: Target date: 11/22/2021     Patient will demonstrate (-) positional testing to indicate resolution of BPPV Baseline: R BPPV;  Goal status: INITIAL   2.  Pt will verbalize understanding  of self management of symptoms in case of reoccurrence Baseline: dependent  Goal status: INITIAL   3.  Pt will improve DPS to >/= 55 Baseline: DPS: 43 Goal status: INITIAL  4. Pt will improve Berg Balance to >/= 45/56 to demonstrate improved standing balance and reduced fall risk Baseline: 40/56 Goal status: INITIAL     ASSESSMENT:   CLINICAL IMPRESSION: Today's skilled PT session focused on continued endurance training on SciFit, patient able to tolerate 4 minutes prior to rest breaks. Vitals stable, but some SOB noted with improvements with rest. Rest of session focused on continued high level balance activities. Will continue per POC.    OBJECTIVE IMPAIRMENTS decreased activity tolerance, decreased balance, and dizziness.    ACTIVITY LIMITATIONS bending, bed mobility, reach over head, and locomotion level   PARTICIPATION LIMITATIONS: meal prep, cleaning, and community activity   PERSONAL FACTORS Age, Time since onset of injury/illness/exacerbation, and 3+ comorbidities: C8 Radiculopathy, COPD, Depression, Dyslipidemia, OA, Mitral Incompetence, Nephrolithiasis  are also affecting patient's functional outcome.    REHAB POTENTIAL: Good   CLINICAL DECISION MAKING: Stable/uncomplicated   EVALUATION COMPLEXITY: Low     PLAN: PT FREQUENCY: 2x/week   PT DURATION: 4 weeks   PLANNED INTERVENTIONS: Therapeutic exercises,  Therapeutic activity, Neuromuscular re-education, Balance training, Gait training, Patient/Family education, Self Care, Joint mobilization, Stair training, Vestibular training, Canalith repositioning, DME instructions, Aquatic Therapy, Dry Needling, Manual therapy, and Re-evaluation   PLAN FOR NEXT SESSION: Continue NuStep/SciFit or walking in Gym and Monitor Vitals. Building gait tolerance for longer distance.Continue balance training. Treat vertigo as needed.    Kirtland Bouchard, PT, DPT 11/01/2021, 2:53 PM

## 2021-11-05 ENCOUNTER — Encounter: Payer: Self-pay | Admitting: Pulmonary Disease

## 2021-11-05 ENCOUNTER — Ambulatory Visit: Payer: Medicare Other | Attending: Adult Health

## 2021-11-05 ENCOUNTER — Ambulatory Visit (INDEPENDENT_AMBULATORY_CARE_PROVIDER_SITE_OTHER): Payer: Medicare Other | Admitting: Pulmonary Disease

## 2021-11-05 VITALS — BP 124/70 | HR 89

## 2021-11-05 DIAGNOSIS — J9611 Chronic respiratory failure with hypoxia: Secondary | ICD-10-CM

## 2021-11-05 DIAGNOSIS — R2681 Unsteadiness on feet: Secondary | ICD-10-CM | POA: Diagnosis present

## 2021-11-05 DIAGNOSIS — H8111 Benign paroxysmal vertigo, right ear: Secondary | ICD-10-CM | POA: Diagnosis present

## 2021-11-05 DIAGNOSIS — R42 Dizziness and giddiness: Secondary | ICD-10-CM | POA: Diagnosis present

## 2021-11-05 DIAGNOSIS — J9612 Chronic respiratory failure with hypercapnia: Secondary | ICD-10-CM

## 2021-11-05 DIAGNOSIS — M6281 Muscle weakness (generalized): Secondary | ICD-10-CM | POA: Insufficient documentation

## 2021-11-05 DIAGNOSIS — J441 Chronic obstructive pulmonary disease with (acute) exacerbation: Secondary | ICD-10-CM | POA: Diagnosis not present

## 2021-11-05 DIAGNOSIS — I2510 Atherosclerotic heart disease of native coronary artery without angina pectoris: Secondary | ICD-10-CM | POA: Diagnosis not present

## 2021-11-05 MED ORDER — PREDNISONE 10 MG PO TABS: 10.0000 mg | ORAL_TABLET | Freq: Every day | ORAL | 2 refills | Status: DC

## 2021-11-05 NOTE — Assessment & Plan Note (Signed)
She is compliant with NIV by report and this is really helping. I discussed alternative AirFit F30 fullface mask for her to avoid pressure marks on the bridge of her nose. We will obtain NIV report and objectively confirm compliance

## 2021-11-05 NOTE — Therapy (Signed)
OUTPATIENT PHYSICAL THERAPY VESTIBULAR TREATMENT NOTE   Patient Name: Kim [name] MRN: [id number] DOB:03/04/1956, 66 y.o., female Today's Date: 11/05/2021  PCP: [name], NP REFERRING PROVIDER: [name], NP   PT End of Session - 11/05/21 1403     Visit Number 5    Number of Visits 9    Date for PT Re-Evaluation 11/22/21    Authorization Type Medicare A+B/BCBS    PT Start Time 1401    PT Stop Time 5883    PT Time Calculation (min) 44 min    Activity Tolerance Patient tolerated treatment well    Behavior During Therapy [facility] for tasks assessed/performed               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   OSTEOARTHRITIS 06/05/2009   PARESTHESIA 08/24/2007   TOBACCO ABUSE 01/25/2010   Past Surgical History:  Procedure Laterality Date   ABDOMINAL HYSTERECTOMY     APPENDECTOMY  1969   CARDIAC CATHETERIZATION N/A 07/05/2015   Procedure: Left Heart Cath and Coronary Angiography;  Surgeon: Leonie Man, MD;  Location: [place] [facility];  Service: Cardiovascular;  Laterality: N/A;   CHOLECYSTECTOMY  1989   collapse lung  1990   COLONOSCOPY WITH PROPOFOL N/A 05/14/2018   Procedure: COLONOSCOPY WITH PROPOFOL;  Surgeon: Irene Shipper, MD;  [facility];  Service: Endoscopy;  Laterality: N/A;   EXCISION MASS ABDOMINAL  2019   EXPLORATORY LAPAROTOMY  1969   LEFT HEART CATH AND CORONARY ANGIOGRAPHY N/A 11/19/2020   Procedure: LEFT HEART CATH AND CORONARY ANGIOGRAPHY;  Surgeon: [name], MD;  Location: [place] [facility];  Service: Cardiovascular;  Laterality: N/A;   POLYPECTOMY  05/14/2018   Procedure: POLYPECTOMY;  Surgeon: Irene Shipper, MD;  [facility];  Service: Endoscopy;;   SALPINGOOPHORECTOMY  2019   Patient Active Problem List   Diagnosis Date Noted   NSVT (nonsustained  ventricular tachycardia) ([place]) 10/11/2021   Hyperkalemia 09/22/2021   SOB (shortness of breath) 09/19/2021   Chronic respiratory failure with hypoxia and hypercapnia ([place])    DNR (do not resuscitate) 09/17/2021   COPD with acute exacerbation ([place]) 08/26/2021   Double vision 08/26/2021   AKI (acute kidney injury) ([place]) 08/26/2021   Postconcussive syndrome 08/26/2021   Elevated troponin 08/26/2021   Subarachnoid hemorrhage ([place]) 08/26/2021   Syncope and collapse 08/26/2021   Heart failure with preserved ejection fraction ([place]) 08/26/2021   NSTEMI (non-ST elevated myocardial infarction) ([place]) 11/17/2020   Osteoporosis 06/05/2020   Chronic combined systolic (congestive) and diastolic (congestive) heart failure ([place]) 03/24/2020   Essential hypertension 03/24/2020   Vertebral fracture, osteoporotic ([place]) 03/05/2020   History of colonic polyps    Benign neoplasm of ascending colon    Neuritis 01/29/2017   Alpha-1-antitrypsin deficiency carrier 01/19/2017   Dependent edema 01/14/2017   Orthostatic hypotension    Allergic rhinitis 08/10/2015   Hyperglycemia    HLD (hyperlipidemia)    Coronary artery calcification seen on CAT scan 07/02/2015   Solitary pulmonary nodule 12/02/2013   Loss of weight 11/10/2013   Osteoarthritis 06/05/2009   Brachial neuritis or radiculitis 06/05/2009   Dyslipidemia 03/12/2009   NEPHROLITHIASIS 01/05/2008   COPD GOLD III  08/31/2007   PARESTHESIA 08/24/2007   Anxiety and depression 11/19/2006    ONSET DATE: 10/04/21 (Referral Date)  REFERRING DIAG: R42 (  ICD-10-CM) - Vertigo  THERAPY DIAG:  Dizziness and giddiness  BPPV (benign paroxysmal positional vertigo), right  Unsteadiness on feet  Rationale for Evaluation and Treatment Rehabilitation  PERTINENT HISTORY: C8 Radiculopathy, COPD, Depression, Dyslipidemia, OA, Mitral Incompetence, Nephrolithiasis   PRECAUTIONS: Fall and Other: On Oxygen  SUBJECTIVE: Patient reports yesterday started to feel  dizzy. Reports the spinning sensation more than lightheadedness. No falls  PAIN:  Are you having pain? No Vitals:   11/05/21 1407  BP: 124/70  Pulse: 89    OBJECTIVE:  POSITIONAL TESTING: Right Dix-Hallpike: no nystagmus Left Dix-Hallpike: no nystagmus Right Roll Test: no nystagmus Left Roll Test: no nystagmus   CURB:  Level of Assistance: SBA and CGA Assistive device utilized: None Curb Comments: completed x  3 reps ascending/descending, more balance challenge noted with descent. 95% Sp02  GAIT: Gait pattern: step through pattern, decreased step length- Right, and decreased step length- Left Distance walked: 230' x 2 Assistive device utilized: None Level of assistance: SBA Comments: Vitals: 96% Sp02, HR: 95   Completed gait for endurance training to work on endurance, completed 2 minute trials (230' each trial) x 2 times. Short seated rest break required between walking trials. Close supervision required. SOB: 5-6/10 with ambulation. Sp02: 94% with ambulation.    NMR: On Airex: Completed alternating stepping strategy forward, completed x 10 reps with no UE support, CGA required. More challenge with stepping backwards onto foam. Seated rest break required due to SOB/fatigue.   Standing Balance: Surface: Airex Position: Narrow Base of Support Completed with: Eyes Open; Head Turns x 10 Reps and Head Nods x 10 Reps      PATIENT EDUCATION: Education details: Continue HEP; Monitor Dizziness Symptoms Person educated: Patient Education method: Explanation Education comprehension: verbalized understanding     HOME EXERCISE PROGRAM:   Access Code: [email or web address]  GOALS: Goals reviewed with patient? Yes   SHORT TERM GOALS = LONG TERM GOALS   LONG TERM GOALS: Target date: 11/22/2021     Patient will demonstrate (-) positional testing to indicate resolution of BPPV Baseline: R BPPV;  Goal status: INITIAL   2.  Pt will verbalize understanding of self management of symptoms in  case of reoccurrence Baseline: dependent  Goal status: INITIAL   3.  Pt will improve DPS to >/= 55 Baseline: DPS: 43 Goal status: INITIAL  4. Pt will improve Berg Balance to >/= 45/56 to demonstrate improved standing balance and reduced fall risk Baseline: 40/56 Goal status: INITIAL     ASSESSMENT:   CLINICAL IMPRESSION: Due to reports of spinning reassessed positional testing, patient demonstrating no nystagmus and negative positional testing. Focused rest of session on continued endurance training with ambulation and standing balance. Will continnue per POC.    OBJECTIVE IMPAIRMENTS decreased activity tolerance, decreased balance, and dizziness.    ACTIVITY LIMITATIONS bending, bed mobility, reach over head, and locomotion level   PARTICIPATION LIMITATIONS: meal prep, cleaning, and community activity   PERSONAL FACTORS Age, Time since onset of injury/illness/exacerbation, and 3+ comorbidities: C8 Radiculopathy, COPD, Depression, Dyslipidemia, OA, Mitral Incompetence, Nephrolithiasis  are also affecting patient's functional outcome.    REHAB POTENTIAL: Good   CLINICAL DECISION MAKING: Stable/uncomplicated   EVALUATION COMPLEXITY: Low     PLAN: PT FREQUENCY: 2x/week   PT DURATION: 4 weeks   PLANNED INTERVENTIONS: Therapeutic exercises, Therapeutic activity, Neuromuscular re-education, Balance training, Gait training, Patient/Family education, Self Care, Joint mobilization, Stair training, Vestibular training, Canalith repositioning, DME instructions, Aquatic Therapy, Dry Needling, Manual therapy, and Re-evaluation  PLAN FOR NEXT SESSION: Continue NuStep/SciFit or walking in Gym and Monitor Vitals. Building gait tolerance for longer distance. Continue balance training. Treat vertigo as needed.    Kirtland Bouchard, PT, DPT 11/05/2021, 2:45 PM

## 2021-11-05 NOTE — Patient Instructions (Signed)
X Rx for small airfit F 30 full face mask  X Get started on dupixent injections

## 2021-11-05 NOTE — Progress Notes (Signed)
   Subjective:    Patient ID: Kim [name], female    DOB: 1955-10-02, 66 y.o.   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   Meds-stopped Daliresp due to skin peeling 01/2021     COPD exacerbations: Multiple since 2020, 2 hospitalizations in 2021 03/2020 hospitalized for 10 days for COPD exacerbation 09/2020 developed COVID infection, received monoclonal antibody 11/2020 adm for COPD exacerbation and stress-induced cardiomyopathy/acute systolic heart failure/type II myocardial infarction secondary to cardiomyopathy   02/2021 - treated for COPD flare, neighbor burning >> exposure to fumes  04/2021 video visit >> pred taper 06/2021 hosp visit for exac  08/2021 hosp for exac, SDH 09/2021 exac after reclast infusion  I reviewed her last hospitalization after Reclast infusion.  She had sudden onset and improved within 24 hours  We initiated process to get her on Dupixent and obtained insurance approval however she is hesitant to start [place] [address] 09/17/2021   She has repeated exacerbations with sudden explosive onset suggestive of asthma,in spite of maximal triple therapy and maintenance steroids She remains on 10 mg of prednisone. She has been compliant with NIV and is settled down with a fullface mask.  She complains of pressure mark over the bridge of her nose. Accompanied by her husband who corroborates history, compliant with oxygen  Significant tests/ events reviewed  09/2020 ABG 7.3 7/46/82 01/2017 alpha-1 >> 102 nml level ,PI* M1N   11/2014: FVC 2.00 L (63%) FEV1 1.00 L (41%) ratio 50  negative bronchodilator response TLC 5.06 L (103%) RV 140% DLCO uncorrected 51%     08/2021>> CT chest: No suspicious or indeterminate focal pulmonary nodule seen. LDCT chest 05/2021 LLL nodule 4 mm, stable CT angiogram chest 09/2020 no  airspace disease   CT angiogram chest 03/24/2020  Severe centrilobular emphysema with mild superimposed diffuse ground-glass opacity with an apical predominance, similar in comparison to prior study. Findings likely reflect smoking related lung disease/respiratory bronchiolitis. New mild compression fracture deformity of L1 since 10/ 2021 01/2020 LDCT - stable, T7 fracture 12/2018 LD CT chest - stable nodules   10/2016 CT chest showed subcentimeter nodules which have been stable since 2017, right adrenal nodule was stable since 2015 and considered benign  Review of Systems neg for any significant sore throat, dysphagia, itching, sneezing, nasal congestion or excess/ purulent secretions, fever, chills, sweats, unintended wt loss, pleuritic or exertional cp, hempoptysis, orthopnea pnd or change in chronic leg swelling. Also denies presyncope, palpitations, heartburn, abdominal pain, nausea, vomiting, diarrhea or change in bowel or urinary habits, dysuria,hematuria, rash, arthralgias, visual complaints, headache, numbness weakness or ataxia.      Objective:   Physical Exam  Gen. Pleasant, well-nourished, in no distress, anxious affect, on oxygen ENT - no thrush, no pallor/icterus,no post nasal drip Neck: No JVD, no thyromegaly, no carotid bruits Lungs: no use of accessory muscles, no dullness to percussion, decreased bilateral without rales or rhonchi  Cardiovascular: Rhythm regular, heart sounds  normal, no murmurs or gallops, no peripheral edema Musculoskeletal: No deformities, no cyanosis or clubbing        Assessment & Plan:

## 2021-11-05 NOTE — Assessment & Plan Note (Signed)
Given recurrent exacerbations and long-term use of prednisone, we will initiate Dupixent.  She is very hesitant to use injectable medications given her prior history of flareups following such medications but she is willing to try. We discussed side effects. She will continue on open triple therapy with Symbicort and Spiriva She will continue on 10 mg of prednisone

## 2021-11-05 NOTE — Telephone Encounter (Signed)
Patient had OV with Dr. Elsworth Soho. Patient portion was not completed. Earnest Bailey, CMA, will work on getting patient portion from patient  Knox Saliva, PharmD, MPH, BCPS, CPP Clinical Pharmacist (Rheumatology and Pulmonology)

## 2021-11-05 NOTE — Addendum Note (Signed)
Addended by: Konrad Felix L on: 11/05/2021 04:17 PM   Modules accepted: Orders

## 2021-11-08 ENCOUNTER — Ambulatory Visit: Payer: Medicare Other

## 2021-11-08 VITALS — BP 131/71 | HR 91

## 2021-11-08 DIAGNOSIS — R2681 Unsteadiness on feet: Secondary | ICD-10-CM

## 2021-11-08 DIAGNOSIS — R42 Dizziness and giddiness: Secondary | ICD-10-CM | POA: Diagnosis not present

## 2021-11-08 DIAGNOSIS — H8111 Benign paroxysmal vertigo, right ear: Secondary | ICD-10-CM

## 2021-11-08 NOTE — Therapy (Signed)
OUTPATIENT PHYSICAL THERAPY VESTIBULAR TREATMENT NOTE   Patient Name: Kim [name] MRN: [id number] DOB:1956-01-03, 66 y.o., female Today's Date: 11/08/2021  PCP: [name], NP REFERRING PROVIDER: [name], NP   PT End of Session - 11/08/21 1320     Visit Number 6    Number of Visits 9    Date for PT Re-Evaluation 11/22/21    Authorization Type Medicare A+B/BCBS    PT Start Time 1316    PT Stop Time 3536    PT Time Calculation (min) 42 min    Activity Tolerance Patient tolerated treatment well    Behavior During Therapy [facility] for tasks assessed/performed                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   OSTEOARTHRITIS 06/05/2009   PARESTHESIA 08/24/2007   TOBACCO ABUSE 01/25/2010   Past Surgical History:  Procedure Laterality Date   ABDOMINAL HYSTERECTOMY     APPENDECTOMY  1969   CARDIAC CATHETERIZATION N/A 07/05/2015   Procedure: Left Heart Cath and Coronary Angiography;  Surgeon: Leonie Man, MD;  Location: [place] [facility];  Service: Cardiovascular;  Laterality: N/A;   CHOLECYSTECTOMY  1989   collapse lung  1990   COLONOSCOPY WITH PROPOFOL N/A 05/14/2018   Procedure: COLONOSCOPY WITH PROPOFOL;  Surgeon: Irene Shipper, MD;  Location: [facility];  Service: Endoscopy;  Laterality: N/A;   EXCISION MASS ABDOMINAL  2019   EXPLORATORY LAPAROTOMY  1969   LEFT HEART CATH AND CORONARY ANGIOGRAPHY N/A 11/19/2020   Procedure: LEFT HEART CATH AND CORONARY ANGIOGRAPHY;  Surgeon: [name], MD;  Location: [place] [facility];  Service: Cardiovascular;  Laterality: N/A;   POLYPECTOMY  05/14/2018   Procedure: POLYPECTOMY;  Surgeon: Irene Shipper, MD;  Location: [facility];  Service: Endoscopy;;   SALPINGOOPHORECTOMY  2019   Patient Active Problem List   Diagnosis Date Noted   NSVT (nonsustained  ventricular tachycardia) ([place]) 10/11/2021   Hyperkalemia 09/22/2021   SOB (shortness of breath) 09/19/2021   Chronic respiratory failure with hypoxia and hypercapnia ([place])    DNR (do not resuscitate) 09/17/2021   COPD with acute exacerbation ([place]) 08/26/2021   Double vision 08/26/2021   AKI (acute kidney injury) ([place]) 08/26/2021   Postconcussive syndrome 08/26/2021   Elevated troponin 08/26/2021   Subarachnoid hemorrhage ([place]) 08/26/2021   Syncope and collapse 08/26/2021   Heart failure with preserved ejection fraction ([place]) 08/26/2021   NSTEMI (non-ST elevated myocardial infarction) ([place]) 11/17/2020   Osteoporosis 06/05/2020   Chronic combined systolic (congestive) and diastolic (congestive) heart failure ([place]) 03/24/2020   Essential hypertension 03/24/2020   Vertebral fracture, osteoporotic ([place]) 03/05/2020   History of colonic polyps    Benign neoplasm of ascending colon    Neuritis 01/29/2017   Alpha-1-antitrypsin deficiency carrier 01/19/2017   Dependent edema 01/14/2017   Orthostatic hypotension    Allergic rhinitis 08/10/2015   Hyperglycemia    HLD (hyperlipidemia)    Coronary artery calcification seen on CAT scan 07/02/2015   Solitary pulmonary nodule 12/02/2013   Loss of weight 11/10/2013   Osteoarthritis 06/05/2009   Brachial neuritis or radiculitis 06/05/2009   Dyslipidemia 03/12/2009   NEPHROLITHIASIS 01/05/2008   COPD GOLD III  08/31/2007   PARESTHESIA 08/24/2007   Anxiety and depression 11/19/2006    ONSET DATE: 10/04/21 (Referral Date)  REFERRING DIAG:  R42 (ICD-10-CM) - Vertigo  THERAPY DIAG:  Dizziness and giddiness  BPPV (benign paroxysmal positional vertigo), right  Unsteadiness on feet  Rationale for Evaluation and Treatment Rehabilitation  PERTINENT HISTORY: C8 Radiculopathy, COPD, Depression, Dyslipidemia, OA, Mitral Incompetence, Nephrolithiasis   PRECAUTIONS: Fall and Other: On Oxygen  SUBJECTIVE: Stated that after took lasix has been  having some lightheadedness. No other new changes/complaints. Reports was lightheaded yesterday and just lowered herself to floor.   PAIN:  Are you having pain? No Vitals:   11/08/21 1320  BP: 131/71  Pulse: 91     OBJECTIVE:  GAIT: Gait pattern: step through pattern, decreased step length- Right, and decreased step length- Left Distance walked: clinic distance Assistive device utilized: None Level of assistance: SBA; CGA Comments: completed gait into/out of session, unsteadiness noted requiring CGA from PT. Reported some lightheadedness. PT monitored vitals with vitals stable.     TherEx: Completed SciFit with BUE/BLE on Level 3.0 x 4 minutes. One minute seated, with the following vitals: HR: 98, SpO2: 94%. Then completed another bout x 4 minutes, followed by 1 minute rest break, and then additional x 3 minutes. Patient tolerating increase in time and resistance well. At end of completion, HR: 102, SpO2: 95%. 10 minutes total completion.   Completed Seated Strengthening with 5# ankle weights:  - Completed alternating marching x 10 reps, cues for technique to promote improved hip/knee flexion.   - Alternating LAQ, with 3 second eccentric control to promote improved strengthening. Completed x 10 reps bilaterally.   - Combined hip abduction and combined hip flexion with large step out/in, completed x 10 reps bilaterally   - Completed Heel Raises with eccentric control x 3 seconds x 10 reps.   NMR: Rockerboard positioned A/P: completed standing balance with holding board steady x 30 seconds, transitioned to EC. With EC significant posterior/anterior sway required CGA to Min A from PT. Patient having significant lightheadedness.  BP assessed: 134/68, increased lightheadedness. Due to symptoms with standing activities transitioned to seated activities.      PATIENT EDUCATION: Education details: Continue HEP Person educated: Patient Education method: Explanation Education  comprehension: verbalized understanding     HOME EXERCISE PROGRAM:   Access Code: [email or web address]  GOALS: Goals reviewed with patient? Yes   SHORT TERM GOALS = LONG TERM GOALS   LONG TERM GOALS: Target date: 11/22/2021     Patient will demonstrate (-) positional testing to indicate resolution of BPPV Baseline: R BPPV;  Goal status: INITIAL   2.  Pt will verbalize understanding of self management of symptoms in case of reoccurrence Baseline: dependent  Goal status: INITIAL   3.  Pt will improve DPS to >/= 55 Baseline: DPS: 43 Goal status: INITIAL  4. Pt will improve Berg Balance to >/= 45/56 to demonstrate improved standing balance and reduced fall risk Baseline: 40/56 Goal status: INITIAL     ASSESSMENT:   CLINICAL IMPRESSION: Today's skilled PT session focused on continued endurance training on SciFit, did not complete ambulation for endurance training due to reports of lightheadedness and increased unsteadiness. Continued balance activites but patient having increased lightheadedness. Transitioned to seated strengthening exercises. Will continue per POC.    OBJECTIVE IMPAIRMENTS decreased activity tolerance, decreased balance, and dizziness.    ACTIVITY LIMITATIONS bending, bed mobility, reach over head, and locomotion level   PARTICIPATION LIMITATIONS: meal prep, cleaning, and community activity   PERSONAL FACTORS Age, Time since onset of injury/illness/exacerbation, and 3+ comorbidities: C8 Radiculopathy, COPD, Depression, Dyslipidemia, OA, Mitral Incompetence, Nephrolithiasis  are also affecting patient's functional outcome.    REHAB POTENTIAL: Good   CLINICAL DECISION MAKING: Stable/uncomplicated   EVALUATION COMPLEXITY: Low     PLAN: PT FREQUENCY: 2x/week   PT DURATION: 4 weeks   PLANNED INTERVENTIONS: Therapeutic exercises, Therapeutic activity, Neuromuscular re-education, Balance training, Gait training, Patient/Family education, Self Care, Joint mobilization,  Stair training, Vestibular training, Canalith repositioning, DME instructions, Aquatic Therapy, Dry Needling, Manual therapy, and Re-evaluation   PLAN FOR NEXT SESSION: Will need re-cert at end of next week. Continue NuStep/SciFit or walking in Gym and Monitor Vitals. Building gait tolerance for longer distance. Continue balance training. Treat vertigo as needed.    Kirtland Bouchard, PT, DPT 11/08/2021, 2:00 PM

## 2021-11-08 NOTE — Telephone Encounter (Signed)
Spoke with patient - she was confused about approval letter she received from [facility] and price she was quoted for [employer] through insurance.  She will plan to stop by the clinic on Monday, 11/11/21 to sign paperwork.  Knox Saliva, PharmD, MPH, BCPS, CPP Clinical Pharmacist (Rheumatology and Pulmonology)

## 2021-11-11 ENCOUNTER — Telehealth: Payer: Self-pay | Admitting: Pulmonary Disease

## 2021-11-11 NOTE — Telephone Encounter (Signed)
See encounter from 6/16. Pt signed Dupixent [place] papers. Placed in RA box for him to sign as well.

## 2021-11-12 ENCOUNTER — Ambulatory Visit: Payer: Medicare Other

## 2021-11-12 VITALS — BP 141/60 | HR 77

## 2021-11-12 DIAGNOSIS — R2681 Unsteadiness on feet: Secondary | ICD-10-CM

## 2021-11-12 DIAGNOSIS — R42 Dizziness and giddiness: Secondary | ICD-10-CM | POA: Diagnosis not present

## 2021-11-12 NOTE — Therapy (Signed)
OUTPATIENT PHYSICAL THERAPY VESTIBULAR TREATMENT NOTE   Patient Name: Kim [name] MRN: [id number] DOB:1955/04/14, 66 y.o., female Today's Date: 11/12/2021  PCP: [name], NP REFERRING PROVIDER: [name], NP   PT End of Session - 11/12/21 1311     Visit Number 7    Number of Visits 9    Date for PT Re-Evaluation 11/22/21    Authorization Type Medicare A+B/BCBS    PT Start Time 1315    PT Stop Time 1400    PT Time Calculation (min) 45 min    Activity Tolerance Patient tolerated treatment well    Behavior During Therapy [facility] for tasks assessed/performed             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   OSTEOARTHRITIS 06/05/2009   PARESTHESIA 08/24/2007   TOBACCO ABUSE 01/25/2010   Past Surgical History:  Procedure Laterality Date   ABDOMINAL HYSTERECTOMY     APPENDECTOMY  1969   CARDIAC CATHETERIZATION N/A 07/05/2015   Procedure: Left Heart Cath and Coronary Angiography;  Surgeon: Leonie Man, MD;  Location: [place] [facility];  Service: Cardiovascular;  Laterality: N/A;   CHOLECYSTECTOMY  1989   collapse lung  1990   COLONOSCOPY WITH PROPOFOL N/A 05/14/2018   Procedure: COLONOSCOPY WITH PROPOFOL;  Surgeon: Irene Shipper, MD;  [facility];  Service: Endoscopy;  Laterality: N/A;   EXCISION MASS ABDOMINAL  2019   EXPLORATORY LAPAROTOMY  1969   LEFT HEART CATH AND CORONARY ANGIOGRAPHY N/A 11/19/2020   Procedure: LEFT HEART CATH AND CORONARY ANGIOGRAPHY;  Surgeon: [name], MD;  Location: [place] [facility];  Service: Cardiovascular;  Laterality: N/A;   POLYPECTOMY  05/14/2018   Procedure: POLYPECTOMY;  Surgeon: Irene Shipper, MD;  [facility];  Service: Endoscopy;;   SALPINGOOPHORECTOMY  2019   Patient Active Problem List   Diagnosis Date Noted   NSVT (nonsustained  ventricular tachycardia) ([place]) 10/11/2021   Hyperkalemia 09/22/2021   SOB (shortness of breath) 09/19/2021   Chronic respiratory failure with hypoxia and hypercapnia ([place])    DNR (do not resuscitate) 09/17/2021   COPD with acute exacerbation ([place]) 08/26/2021   Double vision 08/26/2021   AKI (acute kidney injury) ([place]) 08/26/2021   Postconcussive syndrome 08/26/2021   Elevated troponin 08/26/2021   Subarachnoid hemorrhage ([place]) 08/26/2021   Syncope and collapse 08/26/2021   Heart failure with preserved ejection fraction ([place]) 08/26/2021   NSTEMI (non-ST elevated myocardial infarction) ([place]) 11/17/2020   Osteoporosis 06/05/2020   Chronic combined systolic (congestive) and diastolic (congestive) heart failure ([place]) 03/24/2020   Essential hypertension 03/24/2020   Vertebral fracture, osteoporotic ([place]) 03/05/2020   History of colonic polyps    Benign neoplasm of ascending colon    Neuritis 01/29/2017   Alpha-1-antitrypsin deficiency carrier 01/19/2017   Dependent edema 01/14/2017   Orthostatic hypotension    Allergic rhinitis 08/10/2015   Hyperglycemia    HLD (hyperlipidemia)    Coronary artery calcification seen on CAT scan 07/02/2015   Solitary pulmonary nodule 12/02/2013   Loss of weight 11/10/2013   Osteoarthritis 06/05/2009   Brachial neuritis or radiculitis 06/05/2009   Dyslipidemia 03/12/2009   NEPHROLITHIASIS 01/05/2008   COPD GOLD III  08/31/2007   PARESTHESIA 08/24/2007   Anxiety and depression 11/19/2006    ONSET DATE: 10/04/21 (Referral Date)  REFERRING DIAG: R42 (ICD-10-CM) -  Vertigo  THERAPY DIAG:  Dizziness and giddiness  Unsteadiness on feet  Rationale for Evaluation and Treatment Rehabilitation  PERTINENT HISTORY: C8 Radiculopathy, COPD, Depression, Dyslipidemia, OA, Mitral Incompetence, Nephrolithiasis   PRECAUTIONS: Fall and Other: On Oxygen  SUBJECTIVE: Patient reports that has had two to three episodes of the lightheadedness. Has had to sink down  to the ground in [place]. No lightheadedness currently. Is currently wearing the compression stockings.   PAIN:  Are you having pain? No Vitals:   11/12/21 1319  BP: (!) 141/60  Pulse: 77      OBJECTIVE:  GAIT: Gait pattern: step through pattern, decreased step length- Right, and decreased step length- Left Distance walked: clinic distance Assistive device utilized: None Level of assistance: SBA; CGA Comments: completed gait into/out of session, as well as continued gait working on improved endurance/activity tolerance. 92%, HR: 90. Completed ambulation 350 ft x 1 rep, then require seated rest break. Some unsteadiness noted with ambulation. Then completed additional gait trial x 350 ft. Sp02: 91%, HR: 90.    STAIRS:  Level of Assistance: SBA  Stair Negotiation Technique: Alternating Pattern  with Single Rail on Right  Number of Stairs: 16 stairs prior to rest break, then addition 8 stairs after standing rest break.    Height of Stairs: 6  Comments: SOB noted and mild unsteadiness intermittent with descent. Stand By Assistance and assistance with Oxygen Line Management.     TherEx: Completed Standing Strengthening with 3# ankle weights:             - Completed alternating marching x 10 reps bilat, cues for technique to promote improved hip/knee flexion.              - Completed standing hip extension, with light UE support. Completed x 10 reps bilaterally.              - Completed standing hip abduction x 10 reps bilaterally, cues for technique             - Completed Heel Raises with eccentric control with 3 seconds x 10 reps, x 2 sets.   Updated HEP to include the following standing exercises:  Access Code: [email or web address] URL: [email or web address] Date: 11/12/2021 Prepared by: Baldomero Lamy  Exercises - Romberg Stance with Head Nods  - 1 x daily - 5 x weekly - 1 sets - 10 reps - Romberg Stance with Eyes Closed  - 1 x daily - 5 x weekly - 1 sets - 3 reps - 30 seconds  hold - Sit to Stand with Arms Crossed  - 1 x daily - 5 x weekly - 2 sets - 10 reps - Standing Hip Abduction with Counter Support  - 1 x daily - 5 x weekly - 2 sets - 10 reps - Standing Hip Extension with Counter Support  - 1 x daily - 5 x weekly - 2 sets - 10 reps     PATIENT EDUCATION: Education details: HEP Update Person educated: Patient Education method: Explanation Education comprehension: verbalized understanding     HOME EXERCISE PROGRAM:   Access Code: [email or web address]  GOALS: Goals reviewed with patient? Yes   SHORT TERM GOALS = LONG TERM GOALS   LONG TERM GOALS: Target date: 11/22/2021     Patient will demonstrate (-) positional testing to indicate resolution of BPPV Baseline: R BPPV;  Goal status: INITIAL   2.  Pt will verbalize understanding of self management of symptoms in case of reoccurrence Baseline: dependent  Goal status: INITIAL  3.  Pt will improve DPS to >/= 55 Baseline: DPS: 43 Goal status: INITIAL  4. Pt will improve Berg Balance to >/= 45/56 to demonstrate improved standing balance and reduced fall risk Baseline: 40/56 Goal status: INITIAL     ASSESSMENT:   CLINICAL IMPRESSION: Today's skilled PT session focused on continued endurance training and activities to promote improved activity tolernace. Added standing activities to HEP. Will continue to progress toward all LTGs.   OBJECTIVE IMPAIRMENTS decreased activity tolerance, decreased balance, and dizziness.    ACTIVITY LIMITATIONS bending, bed mobility, reach over head, and locomotion level   PARTICIPATION LIMITATIONS: meal prep, cleaning, and community activity   PERSONAL FACTORS Age, Time since onset of injury/illness/exacerbation, and 3+ comorbidities: C8 Radiculopathy, COPD, Depression, Dyslipidemia, OA, Mitral Incompetence, Nephrolithiasis  are also affecting patient's functional outcome.    REHAB POTENTIAL: Good   CLINICAL DECISION MAKING: Stable/uncomplicated   EVALUATION COMPLEXITY:  Low     PLAN: PT FREQUENCY: 2x/week   PT DURATION: 4 weeks   PLANNED INTERVENTIONS: Therapeutic exercises, Therapeutic activity, Neuromuscular re-education, Balance training, Gait training, Patient/Family education, Self Care, Joint mobilization, Stair training, Vestibular training, Canalith repositioning, DME instructions, Aquatic Therapy, Dry Needling, Manual therapy, and Re-evaluation   PLAN FOR NEXT SESSION: Will need re-cert at end of next week. Continue NuStep/SciFit or walking in Gym and Monitor Vitals. Building gait tolerance for longer distance. Continue balance training. Treat vertigo as needed.    Kirtland Bouchard, PT, DPT 11/12/2021, 2:51 PM

## 2021-11-14 ENCOUNTER — Ambulatory Visit: Payer: Medicare Other

## 2021-11-14 VITALS — BP 124/60 | HR 75

## 2021-11-14 DIAGNOSIS — R2681 Unsteadiness on feet: Secondary | ICD-10-CM

## 2021-11-14 DIAGNOSIS — M6281 Muscle weakness (generalized): Secondary | ICD-10-CM

## 2021-11-14 DIAGNOSIS — R42 Dizziness and giddiness: Secondary | ICD-10-CM | POA: Diagnosis not present

## 2021-11-14 NOTE — Therapy (Signed)
OUTPATIENT PHYSICAL THERAPY VESTIBULAR TREATMENT NOTE/RE-CERTIFICATION   Patient Name: Kim [name] MRN: [id number] DOB:1955/04/16, 66 y.o., female Today's Date: 11/14/2021  PCP: [name], NP REFERRING PROVIDER: [name], NP   PT End of Session - 11/14/21 1446     Visit Number 8    Number of Visits 14    Date for PT Re-Evaluation 12/05/21    Authorization Type Medicare A+B/BCBS    PT Start Time 1446    PT Stop Time 2831    PT Time Calculation (min) 42 min    Activity Tolerance Patient tolerated treatment well    Behavior During Therapy [facility] for tasks assessed/performed              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   OSTEOARTHRITIS 06/05/2009   PARESTHESIA 08/24/2007   TOBACCO ABUSE 01/25/2010   Past Surgical History:  Procedure Laterality Date   ABDOMINAL HYSTERECTOMY     APPENDECTOMY  1969   CARDIAC CATHETERIZATION N/A 07/05/2015   Procedure: Left Heart Cath and Coronary Angiography;  Surgeon: Leonie Man, MD;  Location: [place] [facility];  Service: Cardiovascular;  Laterality: N/A;   CHOLECYSTECTOMY  1989   collapse lung  1990   COLONOSCOPY WITH PROPOFOL N/A 05/14/2018   Procedure: COLONOSCOPY WITH PROPOFOL;  Surgeon: Irene Shipper, MD;  [facility]: [facility];  Service: Endoscopy;  Laterality: N/A;   EXCISION MASS ABDOMINAL  2019   EXPLORATORY LAPAROTOMY  1969   LEFT HEART CATH AND CORONARY ANGIOGRAPHY N/A 11/19/2020   Procedure: LEFT HEART CATH AND CORONARY ANGIOGRAPHY;  Surgeon: [name], MD;  Location: [facility] [facility];  Service: Cardiovascular;  Laterality: N/A;   POLYPECTOMY  05/14/2018   Procedure: POLYPECTOMY;  Surgeon: Irene Shipper, MD;  [facility];  Service: Endoscopy;;   SALPINGOOPHORECTOMY  2019   Patient Active Problem List   Diagnosis Date Noted   NSVT  (nonsustained ventricular tachycardia) ([place]) 10/11/2021   Hyperkalemia 09/22/2021   SOB (shortness of breath) 09/19/2021   Chronic respiratory failure with hypoxia and hypercapnia ([place])    DNR (do not resuscitate) 09/17/2021   COPD with acute exacerbation ([place]) 08/26/2021   Double vision 08/26/2021   AKI (acute kidney injury) ([place]) 08/26/2021   Postconcussive syndrome 08/26/2021   Elevated troponin 08/26/2021   Subarachnoid hemorrhage ([place]) 08/26/2021   Syncope and collapse 08/26/2021   Heart failure with preserved ejection fraction ([place]) 08/26/2021   NSTEMI (non-ST elevated myocardial infarction) ([place]) 11/17/2020   Osteoporosis 06/05/2020   Chronic combined systolic (congestive) and diastolic (congestive) heart failure ([place]) 03/24/2020   Essential hypertension 03/24/2020   Vertebral fracture, osteoporotic ([place]) 03/05/2020   History of colonic polyps    Benign neoplasm of ascending colon    Neuritis 01/29/2017   Alpha-1-antitrypsin deficiency carrier 01/19/2017   Dependent edema 01/14/2017   Orthostatic hypotension    Allergic rhinitis 08/10/2015   Hyperglycemia    HLD (hyperlipidemia)    Coronary artery calcification seen on CAT scan 07/02/2015   Solitary pulmonary nodule 12/02/2013   Loss of weight 11/10/2013   Osteoarthritis 06/05/2009   Brachial neuritis or radiculitis 06/05/2009   Dyslipidemia 03/12/2009   NEPHROLITHIASIS 01/05/2008   COPD GOLD III  08/31/2007   PARESTHESIA 08/24/2007   Anxiety and depression 11/19/2006    ONSET DATE: 10/04/21 (Referral Date)  REFERRING DIAG: R42 (ICD-10-CM) -  Vertigo  THERAPY DIAG:  Unsteadiness on feet  Dizziness and giddiness  Muscle weakness (generalized)  Rationale for Evaluation and Treatment Rehabilitation  PERTINENT HISTORY: C8 Radiculopathy, COPD, Depression, Dyslipidemia, OA, Mitral Incompetence, Nephrolithiasis   PRECAUTIONS: Fall and Other: On Oxygen  SUBJECTIVE: No new changes. Slept in this morning. No  spinning sensation, still some lightheadedness.   PAIN:  Are you having pain? No Vitals:   11/14/21 1452  BP: 124/60  Pulse: 75     OBJECTIVE:  VESTIBULAR ASSESSMENT  POSITIONAL TESTING: Right Dix-Hallpike: no nystagmus Left Dix-Hallpike: no nystagmus  FOTO: DPS: 52 and DFS: 42.3  GAIT: Gait pattern: step through pattern, decreased step length- Right, and decreased step length- Left Distance walked: clinic distance Assistive device utilized: None Level of assistance: SBA; CGA Comments: completed gait into/out of session and with activities during session   TherAct: Reviewed Home Maneuver for [place]. Patient and spouse report understanding of self treatment in case of reoccurrence.    NMR:    [facility] PT Assessment - 11/14/21 0001       Standardized Balance Assessment   Standardized Balance Assessment Berg Balance Test;Dynamic Gait Index      Berg Balance Test   Sit to Stand Able to stand without using hands and stabilize independently    Standing Unsupported Able to stand safely 2 minutes    Sitting with Back Unsupported but Feet Supported on Floor or Stool Able to sit safely and securely 2 minutes    Stand to Sit Sits safely with minimal use of hands    Transfers Able to transfer safely, minor use of hands    Standing Unsupported with Eyes Closed Able to stand 10 seconds safely    Standing Unsupported with Feet Together Able to place feet together independently and stand 1 minute safely    From Standing, Reach Forward with Outstretched Arm Can reach confidently >25 cm (10")   12'   From Standing Position, Pick up Object from Floor Able to pick up shoe safely and easily    From Standing Position, Turn to Look Behind Over each Shoulder Looks behind one side only/other side shows less weight shift    Turn 360 Degrees Able to turn 360 degrees safely one side only in 4 seconds or less    Standing Unsupported, Alternately Place Feet on Step/Stool Able to stand independently and  safely and complete 8 steps in 20 seconds    Standing Unsupported, One Foot in Front Able to take small step independently and hold 30 seconds    Standing on One Leg Able to lift leg independently and hold > 10 seconds    Total Score 52    Berg comment: 52/56      Dynamic Gait Index   Level Surface Moderate Impairment    Change in Gait Speed Mild Impairment    Gait with Horizontal Head Turns Moderate Impairment    Gait with Vertical Head Turns Mild Impairment    Gait and Pivot Turn Moderate Impairment    Step Over Obstacle Mild Impairment    Step Around Obstacles Normal    Steps Normal    Total Score 15    DGI comment: SpO2:97%             30 Second Chair Stand Test: 7 Reps with no UE support. Sp02: 96%, HR: 80     PATIENT EDUCATION: Education details: Progress toward LTGs Person educated: Patient Education method: Explanation Education comprehension: verbalized understanding     HOME EXERCISE  PROGRAM:   Access Code: [email or web address]  GOALS: Goals reviewed with patient? Yes   SHORT TERM GOALS = LONG TERM GOALS   LONG TERM GOALS: Target date: 11/22/2021     Patient will demonstrate (-) positional testing to indicate resolution of BPPV Baseline: R BPPV; negative positional testing Goal status: MET   2.  Pt will verbalize understanding of self management of symptoms in case of reoccurrence Baseline: dependent; reports understanding and independence Goal status: MET   3.  Pt will improve DPS to >/= 55 Baseline: DPS: 43; DPS: 52  Goal status: On-Going  4. Pt will improve Berg Balance to >/= 45/56 to demonstrate improved standing balance and reduced fall risk Baseline: 40/56; 52/56 Goal status: MET    Updated Goals  SHORT TERM GOALS = LONG TERM GOALS  LONG TERM GOALS:  Target date:  12/05/2021  Pt will be independent with final HEP for improved balance and strengthening  Baseline: HEP Etsablished Goal status: INITIAL  2.  Pt will improve DPS to >/= 55 Baseline:  DPS: 43; DPS: 52  Goal status: IN PROGRESS  3.  Pt will improve DGI to >/= 19/24 to demonstrate improved balance and reduced fall risk Baseline: 15/24 Goal status: INITIAL  4.  Pt will improve 30 second chair stand test to </= 10 reps to demo improved functional LE strength Baseline: 7 reps Goal status: INITIAL  ASSESSMENT:   CLINICAL IMPRESSION: Today's skilled PT session included assessment of patient's progress toward LTGs. Patient able to meet LTG #1, 2 and 3 demonstrating improved balance on Berg Balance to 52/56, and resolution of BPPV. Completed further assessment of balance with DGI with patient scoring 15/24. Patient has made progress with PT services, and will continue to benefit from skilled PT services to further progress balance and reduce fall risk.     OBJECTIVE IMPAIRMENTS decreased activity tolerance, decreased balance, and dizziness.    ACTIVITY LIMITATIONS bending, bed mobility, reach over head, and locomotion level   PARTICIPATION LIMITATIONS: meal prep, cleaning, and community activity   PERSONAL FACTORS Age, Time since onset of injury/illness/exacerbation, and 3+ comorbidities: C8 Radiculopathy, COPD, Depression, Dyslipidemia, OA, Mitral Incompetence, Nephrolithiasis  are also affecting patient's functional outcome.    REHAB POTENTIAL: Good   CLINICAL DECISION MAKING: Stable/uncomplicated   EVALUATION COMPLEXITY: Low     PLAN: PT FREQUENCY: 2x/week   PT DURATION: 3 weeks   PLANNED INTERVENTIONS: Therapeutic exercises, Therapeutic activity, Neuromuscular re-education, Balance training, Gait training, Patient/Family education, Self Care, Joint mobilization, Stair training, Vestibular training, Canalith repositioning, DME instructions, Aquatic Therapy, Dry Needling, Manual therapy, and Re-evaluation   PLAN FOR NEXT SESSION: Continue NuStep/SciFit or walking in Gym and Monitor Vitals. Building gait tolerance for longer distance. Continue balance training.  Treat vertigo as needed.    Kirtland Bouchard, PT, DPT 11/14/2021, 4:23 PM

## 2021-11-19 ENCOUNTER — Ambulatory Visit: Payer: Medicare Other

## 2021-11-19 VITALS — BP 123/58 | HR 74

## 2021-11-19 DIAGNOSIS — M6281 Muscle weakness (generalized): Secondary | ICD-10-CM

## 2021-11-19 DIAGNOSIS — R2681 Unsteadiness on feet: Secondary | ICD-10-CM

## 2021-11-19 DIAGNOSIS — R42 Dizziness and giddiness: Secondary | ICD-10-CM

## 2021-11-19 NOTE — Therapy (Signed)
OUTPATIENT PHYSICAL THERAPY VESTIBULAR TREATMENT NOTE  Patient Name: Kim [name] MRN: [id number] DOB:November 27, 1955, 66 y.o., female Today's Date: 11/19/2021  PCP: [name], NP REFERRING PROVIDER: [name], NP   PT End of Session - 11/19/21 1102     Visit Number 9    Number of Visits 14    Date for PT Re-Evaluation 12/05/21    Authorization Type Medicare A+B/BCBS    PT Start Time 1102    PT Stop Time 0973    PT Time Calculation (min) 43 min    Activity Tolerance Patient tolerated treatment well    Behavior During Therapy [facility] for tasks assessed/performed               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   OSTEOARTHRITIS 06/05/2009   PARESTHESIA 08/24/2007   TOBACCO ABUSE 01/25/2010   Past Surgical History:  Procedure Laterality Date   ABDOMINAL HYSTERECTOMY     APPENDECTOMY  1969   CARDIAC CATHETERIZATION N/A 07/05/2015   Procedure: Left Heart Cath and Coronary Angiography;  Surgeon: Leonie Man, MD;  Location: [place];  Service: Cardiovascular;  Laterality: N/A;   CHOLECYSTECTOMY  1989   collapse lung  1990   COLONOSCOPY WITH PROPOFOL N/A 05/14/2018   Procedure: COLONOSCOPY WITH PROPOFOL;  Surgeon: Irene Shipper, MD;  [facility];  Service: Endoscopy;  Laterality: N/A;   EXCISION MASS ABDOMINAL  2019   EXPLORATORY LAPAROTOMY  1969   LEFT HEART CATH AND CORONARY ANGIOGRAPHY N/A 11/19/2020   Procedure: LEFT HEART CATH AND CORONARY ANGIOGRAPHY;  Surgeon: [name], MD;  Location: [facility];  Service: Cardiovascular;  Laterality: N/A;   POLYPECTOMY  05/14/2018   Procedure: POLYPECTOMY;  Surgeon: Irene Shipper, MD;  [facility];  Service: Endoscopy;;   SALPINGOOPHORECTOMY  2019   Patient Active Problem List   Diagnosis Date Noted   NSVT (nonsustained  ventricular tachycardia) ([place]) 10/11/2021   Hyperkalemia 09/22/2021   SOB (shortness of breath) 09/19/2021   Chronic respiratory failure with hypoxia and hypercapnia ([place])    DNR (do not resuscitate) 09/17/2021   COPD with acute exacerbation ([place]) 08/26/2021   Double vision 08/26/2021   AKI (acute kidney injury) ([place]) 08/26/2021   Postconcussive syndrome 08/26/2021   Elevated troponin 08/26/2021   Subarachnoid hemorrhage ([place]) 08/26/2021   Syncope and collapse 08/26/2021   Heart failure with preserved ejection fraction ([place]) 08/26/2021   NSTEMI (non-ST elevated myocardial infarction) ([place]) 11/17/2020   Osteoporosis 06/05/2020   Chronic combined systolic (congestive) and diastolic (congestive) heart failure ([place]) 03/24/2020   Essential hypertension 03/24/2020   Vertebral fracture, osteoporotic ([place]) 03/05/2020   History of colonic polyps    Benign neoplasm of ascending colon    Neuritis 01/29/2017   Alpha-1-antitrypsin deficiency carrier 01/19/2017   Dependent edema 01/14/2017   Orthostatic hypotension    Allergic rhinitis 08/10/2015   Hyperglycemia    HLD (hyperlipidemia)    Coronary artery calcification seen on CAT scan 07/02/2015   Solitary pulmonary nodule 12/02/2013   Loss of weight 11/10/2013   Osteoarthritis 06/05/2009   Brachial neuritis or radiculitis 06/05/2009   Dyslipidemia 03/12/2009   NEPHROLITHIASIS 01/05/2008   COPD GOLD III  08/31/2007   PARESTHESIA 08/24/2007   Anxiety and depression 11/19/2006    ONSET DATE: 10/04/21 (Referral Date)  REFERRING DIAG: R42 (ICD-10-CM) -  Vertigo  THERAPY DIAG:  Unsteadiness on feet  Dizziness and giddiness  Muscle weakness (generalized)  Rationale for Evaluation and Treatment Rehabilitation  PERTINENT HISTORY: C8 Radiculopathy, COPD, Depression, Dyslipidemia, OA, Mitral Incompetence, Nephrolithiasis   PRECAUTIONS: Fall and Other: On Oxygen  SUBJECTIVE: Patient reports feeling pretty good. No falls. Reports had a  relaxing weekend.   PAIN:  Are you having pain? No Vitals:   11/19/21 1105  BP: (!) 123/58  Pulse: 74  SpO2: 97%     OBJECTIVE:  GAIT: Gait pattern: step through pattern, decreased step length- Right, and decreased step length- Left Distance walked: clinic distance Assistive device utilized: None Level of assistance: SBA; CGA Comments: Completed ambulation in bursts of time, able to complete 2 minutes 40 seconds SpO2: 92%, HR: 87. Then completed additional 2 minutes, with SpO2: 93%, HR: 87. Then completed additional 2 15 seconds minutes for approx 7 minutes total. SOB rated 8/10. Required extended seated rest break at end due to fatigue. Cues for breathing to promote reduced SOB.   NMR:  Completed sit to stands with BLE placed on airex and no UE support, completed 2 x 10 reps with seated rest break between sets. Supervision  Standing on [place] on Upward Incline: Completed Staggered Stance with Eyes Open with Horizontal x 5 reps each direction, alternating foot position. CGA, increased challenge and postural sway noted with RLE > LLE posterior.    Completed walking with bilat toe tap to cone followed by step over, completed 5 cones x 2 laps down and back. CGA and intermittent cues for step length.   Completed ambulation with horizontal/vertical head 4 x 45'. SOB rated 7/10 requiring seated rest break. Mild sway. No dizziness.    PATIENT EDUCATION: Education details: Continue HEP Person educated: Patient Education method: Explanation Education comprehension: verbalized understanding     HOME EXERCISE PROGRAM:   Access Code: [email or web address]  GOALS: Goals reviewed with patient? Yes  SHORT TERM GOALS = LONG TERM GOALS  LONG TERM GOALS:  Target date:  12/05/2021  Pt will be independent with final HEP for improved balance and strengthening  Baseline: HEP Etsablished Goal status: INITIAL  2.  Pt will improve DPS to >/= 55 Baseline: DPS: 43; DPS: 52  Goal status: IN PROGRESS  3.   Pt will improve DGI to >/= 19/24 to demonstrate improved balance and reduced fall risk Baseline: 15/24 Goal status: INITIAL  4.  Pt will improve 30 second chair stand test to </= 10 reps to demo improved functional LE strength Baseline: 7 reps Goal status: INITIAL  ASSESSMENT:   CLINICAL IMPRESSION: Today's skilled PT session focused on continued activity tolerance, progressing ambulation distance/time prior to needing seated rest break, still noted to have SOB but vitals stable. Rest of session focused on high level balance with most challenge noted with SLS and tandem stance, mainly with RLE posterior. Will continue per POC.    OBJECTIVE IMPAIRMENTS decreased activity tolerance, decreased balance, and dizziness.    ACTIVITY LIMITATIONS bending, bed mobility, reach over head, and locomotion level   PARTICIPATION LIMITATIONS: meal prep, cleaning, and community activity   PERSONAL FACTORS Age, Time since onset of injury/illness/exacerbation, and 3+ comorbidities: C8 Radiculopathy, COPD, Depression, Dyslipidemia, OA, Mitral Incompetence, Nephrolithiasis  are also affecting patient's functional outcome.    REHAB POTENTIAL: Good   CLINICAL DECISION MAKING: Stable/uncomplicated   EVALUATION COMPLEXITY: Low     PLAN: PT FREQUENCY: 2x/week   PT DURATION: 3 weeks   PLANNED INTERVENTIONS: Therapeutic exercises, Therapeutic activity,  Neuromuscular re-education, Balance training, Gait training, Patient/Family education, Self Care, Joint mobilization, Stair training, Vestibular training, Canalith repositioning, DME instructions, Aquatic Therapy, Dry Needling, Manual therapy, and Re-evaluation   PLAN FOR NEXT SESSION: Continue NuStep/SciFit [facility]) and [employer]. Bending and Looking up on Shelf. Building gait tolerance for longer distance. Continue balance training. Treat vertigo as needed.    Kirtland Bouchard, PT, DPT 11/19/2021, 11:48 AM

## 2021-11-22 ENCOUNTER — Ambulatory Visit: Payer: Medicare Other

## 2021-11-22 VITALS — BP 109/51 | HR 84

## 2021-11-22 DIAGNOSIS — R42 Dizziness and giddiness: Secondary | ICD-10-CM

## 2021-11-22 DIAGNOSIS — M6281 Muscle weakness (generalized): Secondary | ICD-10-CM

## 2021-11-22 DIAGNOSIS — R2681 Unsteadiness on feet: Secondary | ICD-10-CM

## 2021-11-22 NOTE — Therapy (Signed)
OUTPATIENT PHYSICAL THERAPY VESTIBULAR TREATMENT NOTE/PROGRESS NOTE  Patient Name: Kim [name] MRN: [id number] DOB:1955-09-30, 66 y.o., female Today's Date: 11/22/2021  PCP: [name], NP REFERRING PROVIDER: [name], NP  Physical Therapy Progress Note   Dates of Reporting Period: 10/21/21 - 11/22/21  See Note below for Objective Data and Assessment of Progress/Goals.  Thank you for the referral of this patient. Guillermina [place], PT, DPT    PT End of Session - 11/22/21 1311     Visit Number 10    Number of Visits 14    Date for PT Re-Evaluation 12/05/21    Authorization Type Medicare A+B/BCBS    PT Start Time 1313    PT Stop Time 1358    PT Time Calculation (min) 45 min    Activity Tolerance Patient tolerated treatment well    Behavior During Therapy [facility] for tasks assessed/performed                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   OSTEOARTHRITIS 06/05/2009   PARESTHESIA 08/24/2007   TOBACCO ABUSE 01/25/2010   Past Surgical History:  Procedure Laterality Date   ABDOMINAL HYSTERECTOMY     APPENDECTOMY  1969   CARDIAC CATHETERIZATION N/A 07/05/2015   Procedure: Left Heart Cath and Coronary Angiography;  Surgeon: Leonie Man, MD;  Location: [place] [facility];  Service: Cardiovascular;  Laterality: N/A;   CHOLECYSTECTOMY  1989   collapse lung  1990   COLONOSCOPY WITH PROPOFOL N/A 05/14/2018   Procedure: COLONOSCOPY WITH PROPOFOL;  Surgeon: Irene Shipper, MD;  [facility]: [facility];  Service: Endoscopy;  Laterality: N/A;   EXCISION MASS ABDOMINAL  2019   EXPLORATORY LAPAROTOMY  1969   LEFT HEART CATH AND CORONARY ANGIOGRAPHY N/A 11/19/2020   Procedure: LEFT HEART CATH AND CORONARY ANGIOGRAPHY;  Surgeon: [name], MD;  Location: [place] [facility];  Service: Cardiovascular;  Laterality:  N/A;   POLYPECTOMY  05/14/2018   Procedure: POLYPECTOMY;  Surgeon: Irene Shipper, MD;  [facility]: [facility];  Service: Endoscopy;;   SALPINGOOPHORECTOMY  2019   Patient Active Problem List   Diagnosis Date Noted   NSVT (nonsustained ventricular tachycardia) ([place]) 10/11/2021   Hyperkalemia 09/22/2021   SOB (shortness of breath) 09/19/2021   Chronic respiratory failure with hypoxia and hypercapnia ([place])    DNR (do not resuscitate) 09/17/2021   COPD with acute exacerbation ([place]) 08/26/2021   Double vision 08/26/2021   AKI (acute kidney injury) ([place]) 08/26/2021   Postconcussive syndrome 08/26/2021   Elevated troponin 08/26/2021   Subarachnoid hemorrhage ([place]) 08/26/2021   Syncope and collapse 08/26/2021   Heart failure with preserved ejection fraction ([place]) 08/26/2021   NSTEMI (non-ST elevated myocardial infarction) ([place]) 11/17/2020   Osteoporosis 06/05/2020   Chronic combined systolic (congestive) and diastolic (congestive) heart failure ([place]) 03/24/2020   Essential hypertension 03/24/2020   Vertebral fracture, osteoporotic ([place]) 03/05/2020   History of colonic polyps    Benign neoplasm of ascending colon    Neuritis 01/29/2017   Alpha-1-antitrypsin deficiency carrier 01/19/2017   Dependent edema 01/14/2017   Orthostatic hypotension    Allergic rhinitis 08/10/2015   Hyperglycemia    HLD (hyperlipidemia)    Coronary artery calcification seen on CAT scan 07/02/2015   Solitary pulmonary nodule 12/02/2013   Loss of weight 11/10/2013   Osteoarthritis 06/05/2009   Brachial neuritis  or radiculitis 06/05/2009   Dyslipidemia 03/12/2009   NEPHROLITHIASIS 01/05/2008   COPD GOLD III  08/31/2007   PARESTHESIA 08/24/2007   Anxiety and depression 11/19/2006    ONSET DATE: 10/04/21 (Referral Date)  REFERRING DIAG: R42 (ICD-10-CM) - Vertigo  THERAPY DIAG:  Unsteadiness on feet  Dizziness and giddiness  Muscle weakness (generalized)  Rationale for Evaluation and Treatment  Rehabilitation  PERTINENT HISTORY: C8 Radiculopathy, COPD, Depression, Dyslipidemia, OA, Mitral Incompetence, Nephrolithiasis   PRECAUTIONS: Fall and Other: On Oxygen  SUBJECTIVE: No new changes/complaints. No falls. Reports a few jerking spells, reports with some associated lightheadedness.   PAIN:  Are you having pain? No Vitals:   11/22/21 1315 11/22/21 1339  BP: (!) 128/54 (!) 109/51  Pulse: 77 84      OBJECTIVE:  GAIT: Gait pattern: step through pattern, decreased step length- Right, and decreased step length- Left Distance walked: clinic distance Assistive device utilized: None Level of assistance: SBA; CGA TherEx:   Completed SciFit on [place], at Level 3.0 x 15 minutes total. Completed in increments to patients' tolerance, usually able to tolerate 5 minutes at a time with completion, short intermittent rest breaks required. Vitals stable, Sp02: 94-96%, HR: 80-85 with completion. BP did have drop after completion, but stable, 109/51. Then 113/63 after rest break.   NMR:  Simulated bending and reaching low to floor, and then high to cabinet, as patient reports this is difficult. Completed x 6 cones. No symptoms. Patient bending to floor and reaching in low cabinets, and then combination movement x 6 cones.   Then completed vertical head turns to simulate looking up/neck extension, completed x 15 reps. No symptoms. Mild lightheadedness at end of completion. Vitals stable.    PATIENT EDUCATION: Education details: Continue HEP Person educated: Patient Education method: Explanation Education comprehension: verbalized understanding     HOME EXERCISE PROGRAM:   Access Code: [email or web address]  GOALS: Goals reviewed with patient? Yes  SHORT TERM GOALS = LONG TERM GOALS  LONG TERM GOALS:  Target date:  12/05/2021  Pt will be independent with final HEP for improved balance and strengthening  Baseline: HEP Etsablished Goal status: INITIAL  2.  Pt will improve DPS to >/=  55 Baseline: DPS: 43; DPS: 52  Goal status: IN PROGRESS  3.  Pt will improve DGI to >/= 19/24 to demonstrate improved balance and reduced fall risk Baseline: 15/24 Goal status: INITIAL  4.  Pt will improve 30 second chair stand test to </= 10 reps to demo improved functional LE strength Baseline: 7 reps Goal status: INITIAL  ASSESSMENT:   CLINICAL IMPRESSION: Today's skilled PT session focused on continued activity tolerance, progressing to hills challenge on SciFit. Intermittent rest breaks still required due to SOB/fatigue. Simulated bending/reaching into cabinets during session, no symptoms noted. Educated to trial at home. Patient is making steady progress and will continue per POC.    OBJECTIVE IMPAIRMENTS decreased activity tolerance, decreased balance, and dizziness.    ACTIVITY LIMITATIONS bending, bed mobility, reach over head, and locomotion level   PARTICIPATION LIMITATIONS: meal prep, cleaning, and community activity   PERSONAL FACTORS Age, Time since onset of injury/illness/exacerbation, and 3+ comorbidities: C8 Radiculopathy, COPD, Depression, Dyslipidemia, OA, Mitral Incompetence, Nephrolithiasis  are also affecting patient's functional outcome.    REHAB POTENTIAL: Good   CLINICAL DECISION MAKING: Stable/uncomplicated   EVALUATION COMPLEXITY: Low     PLAN: PT FREQUENCY: 2x/week   PT DURATION: 3 weeks   PLANNED INTERVENTIONS: Therapeutic exercises, Therapeutic activity, Neuromuscular re-education, Balance training, Gait  training, Patient/Family education, Self Care, Joint mobilization, Stair training, Vestibular training, Canalith repositioning, DME instructions, Aquatic Therapy, Dry Needling, Manual therapy, and Re-evaluation   PLAN FOR NEXT SESSION: Continue NuStep/SciFit [facility]) and [employer]. Bending and Looking up on Shelf. Building gait tolerance for longer distance. Continue balance training. Treat vertigo as needed.    Kirtland Bouchard, PT,  DPT 11/22/2021, 2:00 PM

## 2021-11-26 ENCOUNTER — Ambulatory Visit: Payer: Medicare Other

## 2021-11-26 NOTE — Telephone Encounter (Signed)
I spoke with patient - she states she stopped by the clinic 11/11/21 to sign [employer] form.  Pharmacy team did not receive application. I've requested patient to stop by the clinic again to sign form. She will stop by on Thursday 11/28/21 to sign form and has been advised to ask for pharmacy team  Knox Saliva, PharmD, MPH, BCPS, CPP Clinical Pharmacist (Rheumatology and Pulmonology)

## 2021-11-28 NOTE — Telephone Encounter (Signed)
Called patient to apologize for confusion. She is requesting application to be mailed to home. She will complete and return to clinic  Knox Saliva, PharmD, MPH, BCPS, CPP Clinical Pharmacist (Rheumatology and Pulmonology)

## 2021-11-28 NOTE — Telephone Encounter (Signed)
Patient stopped by clinic today to complete [employer] paperwork but did not choose to wait for me to arrive from my other clinic  Will have to f/u with patient to get her to complete application  Knox Saliva, PharmD, MPH, BCPS, CPP Clinical Pharmacist (Rheumatology and Pulmonology)

## 2021-11-29 ENCOUNTER — Other Ambulatory Visit: Payer: Self-pay | Admitting: Primary Care

## 2021-11-29 ENCOUNTER — Ambulatory Visit: Payer: Medicare Other

## 2021-12-03 ENCOUNTER — Ambulatory Visit: Payer: Medicare Other

## 2021-12-03 VITALS — BP 137/70 | HR 82

## 2021-12-03 DIAGNOSIS — M6281 Muscle weakness (generalized): Secondary | ICD-10-CM

## 2021-12-03 DIAGNOSIS — R42 Dizziness and giddiness: Secondary | ICD-10-CM | POA: Diagnosis not present

## 2021-12-03 DIAGNOSIS — R2681 Unsteadiness on feet: Secondary | ICD-10-CM

## 2021-12-03 NOTE — Therapy (Signed)
OUTPATIENT PHYSICAL THERAPY VESTIBULAR TREATMENT NOTE  Patient Name: Kim [name] MRN: [id number] DOB:[birth year], 66 y.o., female Today's Date: 12/03/2021  PCP: [name], NP REFERRING PROVIDER: [name], NP    PT End of Session - 12/03/21 1319     Visit Number 11    Number of Visits 14    Date for PT Re-Evaluation 12/05/21    Authorization Type Medicare A+B/BCBS    PT Start Time 3491    PT Stop Time 1400    PT Time Calculation (min) 43 min    Activity Tolerance Patient tolerated treatment well    Behavior During Therapy [facility] for tasks assessed/performed             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   OSTEOARTHRITIS 06/05/2009   PARESTHESIA 08/24/2007   TOBACCO ABUSE 01/25/2010   Past Surgical History:  Procedure Laterality Date   ABDOMINAL HYSTERECTOMY     APPENDECTOMY  1969   CARDIAC CATHETERIZATION N/A 07/05/2015   Procedure: Left Heart Cath and Coronary Angiography;  Surgeon: Leonie Man, MD;  Location: [place] [facility];  Service: Cardiovascular;  Laterality: N/A;   CHOLECYSTECTOMY  1989   collapse lung  1990   COLONOSCOPY WITH PROPOFOL N/A 05/14/2018   Procedure: COLONOSCOPY WITH PROPOFOL;  Surgeon: Irene Shipper, MD;  [facility];  Service: Endoscopy;  Laterality: N/A;   EXCISION MASS ABDOMINAL  2019   EXPLORATORY LAPAROTOMY  1969   LEFT HEART CATH AND CORONARY ANGIOGRAPHY N/A 11/19/2020   Procedure: LEFT HEART CATH AND CORONARY ANGIOGRAPHY;  Surgeon: [name], MD;  Location: [place] [facility];  Service: Cardiovascular;  Laterality: N/A;   POLYPECTOMY  05/14/2018   Procedure: POLYPECTOMY;  Surgeon: Irene Shipper, MD;  [facility];  Service: Endoscopy;;   SALPINGOOPHORECTOMY  2019   Patient Active Problem List   Diagnosis Date Noted   NSVT (nonsustained  ventricular tachycardia) ([place]) 10/11/2021   Hyperkalemia 09/22/2021   SOB (shortness of breath) 09/19/2021   Chronic respiratory failure with hypoxia and hypercapnia ([place])    DNR (do not resuscitate) 09/17/2021   COPD with acute exacerbation ([place]) 08/26/2021   Double vision 08/26/2021   AKI (acute kidney injury) ([place]) 08/26/2021   Postconcussive syndrome 08/26/2021   Elevated troponin 08/26/2021   Subarachnoid hemorrhage ([place]) 08/26/2021   Syncope and collapse 08/26/2021   Heart failure with preserved ejection fraction ([place]) 08/26/2021   NSTEMI (non-ST elevated myocardial infarction) ([place]) 11/17/2020   Osteoporosis 06/05/2020   Chronic combined systolic (congestive) and diastolic (congestive) heart failure ([place]) 03/24/2020   Essential hypertension 03/24/2020   Vertebral fracture, osteoporotic ([place]) 03/05/2020   History of colonic polyps    Benign neoplasm of ascending colon    Neuritis 01/29/2017   Alpha-1-antitrypsin deficiency carrier 01/19/2017   Dependent edema 01/14/2017   Orthostatic hypotension    Allergic rhinitis 08/10/2015   Hyperglycemia    HLD (hyperlipidemia)    Coronary artery calcification seen on CAT scan 07/02/2015   Solitary pulmonary nodule 12/02/2013   Loss of weight 11/10/2013   Osteoarthritis 06/05/2009   Brachial neuritis or radiculitis 06/05/2009   Dyslipidemia 03/12/2009   NEPHROLITHIASIS 01/05/2008   COPD GOLD III  08/31/2007   PARESTHESIA 08/24/2007   Anxiety and depression 11/19/2006    ONSET DATE: 10/04/21 (Referral Date)  REFERRING DIAG: R42 (ICD-10-CM) -  Vertigo  THERAPY DIAG:  Unsteadiness on feet  Dizziness and giddiness  Muscle weakness (generalized)  Rationale for Evaluation and Treatment Rehabilitation  PERTINENT HISTORY: C8 Radiculopathy, COPD, Depression, Dyslipidemia, OA, Mitral Incompetence, Nephrolithiasis   PRECAUTIONS: Fall and Other: On Oxygen  SUBJECTIVE: Patient reports had two episodes where she got  dizzy/lightheadedness and both times she slide down into the floor. Rep  PAIN:  Are you having pain? No Vitals:   12/03/21 1331  BP: 137/70  Pulse: 82    OBJECTIVE:  GAIT: Gait pattern: step through pattern, decreased step length- Right, and decreased step length- Left Distance walked: clinic distance into/out of session Assistive device utilized: None Level of assistance: SBA; CGA  TherEx:   Completed SciFit on [place], at Level 4.0 x 10 minutes total. Completed in increments to patients' tolerance, usually able to tolerate 4-5 minutes at a time with completion, short intermittent rest breaks required. Vitals stable, Sp02: 94-95%, HR: 80-85 with completion. BP stable 134/64 after completion.   Self Care:  Due to recent syncope episodes and falls associated, PT educating for potential to follow up with cardiologist. Patient agreeable and will plan to call cardiologist to see if current appt can be moved up. PT educating on monitoring weight due to reports of frequent weight fluctuations and shakiness associated with falls due to episodes patient is having. Will continue to assess.    PATIENT EDUCATION: Education details: Plan to assess LTGs next session Person educated: Patient Education method: Explanation Education comprehension: verbalized understanding     HOME EXERCISE PROGRAM:   Access Code: [email or web address]  GOALS: Goals reviewed with patient? Yes  SHORT TERM GOALS = LONG TERM GOALS  LONG TERM GOALS:  Target date:  12/05/2021  Pt will be independent with final HEP for improved balance and strengthening  Baseline: HEP Etsablished Goal status: INITIAL  2.  Pt will improve DPS to >/= 55 Baseline: DPS: 43; DPS: 52  Goal status: IN PROGRESS  3.  Pt will improve DGI to >/= 19/24 to demonstrate improved balance and reduced fall risk Baseline: 15/24 Goal status: INITIAL  4.  Pt will improve 30 second chair stand test to </= 10 reps to demo improved functional LE  strength Baseline: 7 reps Goal status: INITIAL  ASSESSMENT:   CLINICAL IMPRESSION: Today's skilled PT session focused on continued activity tolerance progressing intensity of hills on SciFit. Patient tolerating increase well. A lot of time during session spent on self care and education, PT educating it may be beneficial to follow up with Cardiologist. Will continue per POC.    OBJECTIVE IMPAIRMENTS decreased activity tolerance, decreased balance, and dizziness.    ACTIVITY LIMITATIONS bending, bed mobility, reach over head, and locomotion level   PARTICIPATION LIMITATIONS: meal prep, cleaning, and community activity   PERSONAL FACTORS Age, Time since onset of injury/illness/exacerbation, and 3+ comorbidities: C8 Radiculopathy, COPD, Depression, Dyslipidemia, OA, Mitral Incompetence, Nephrolithiasis  are also affecting patient's functional outcome.    REHAB POTENTIAL: Good   CLINICAL DECISION MAKING: Stable/uncomplicated   EVALUATION COMPLEXITY: Low     PLAN: PT FREQUENCY: 2x/week   PT DURATION: 3 weeks   PLANNED INTERVENTIONS: Therapeutic exercises, Therapeutic activity, Neuromuscular re-education, Balance training, Gait training, Patient/Family education, Self Care, Joint mobilization, Stair training, Vestibular training, Canalith repositioning, DME instructions, Aquatic Therapy, Dry Needling, Manual therapy, and Re-evaluation   PLAN FOR NEXT SESSION: Check LTGs + D/C    Kirtland Bouchard, PT, DPT 12/03/2021, 2:06 PM

## 2021-12-05 ENCOUNTER — Ambulatory Visit: Payer: Medicare Other

## 2021-12-05 VITALS — BP 114/69 | HR 81

## 2021-12-05 DIAGNOSIS — R42 Dizziness and giddiness: Secondary | ICD-10-CM | POA: Diagnosis not present

## 2021-12-05 DIAGNOSIS — R2681 Unsteadiness on feet: Secondary | ICD-10-CM

## 2021-12-05 DIAGNOSIS — M6281 Muscle weakness (generalized): Secondary | ICD-10-CM

## 2021-12-05 NOTE — Telephone Encounter (Signed)
Patient dropped off her portion of application(called patient to let her know).  Submitted Patient Assistance Application to [employer] for [place] along with provider portion, patient portion, med list, insurance card copy, and PA. Will update patient when we receive a response.  Fax# [phone] Phone# [phone]  Knox Saliva, PharmD, MPH, BCPS, CPP Clinical Pharmacist (Rheumatology and Pulmonology)

## 2021-12-05 NOTE — Therapy (Signed)
OUTPATIENT PHYSICAL THERAPY VESTIBULAR TREATMENT NOTE/DISCHARGE SUMMARY  Patient Name: Kim [name] MRN: [id number] DOB:1955/08/03, 66 y.o., female Today's Date: 12/05/2021  PCP: [name], NP REFERRING PROVIDER: [name], NP  PHYSICAL THERAPY DISCHARGE SUMMARY  Visits from Start of Care: 12  Current functional level related to goals / functional outcomes: See Clinical Impression Statement   Remaining deficits: Lightheadedness; Mild Imbalance   Education / Equipment: HEP Provided   Patient agrees to discharge. Patient goals were met. Patient is being discharged due to meeting the stated rehab goals.    PT End of Session - 12/05/21 1320     Visit Number 12    Number of Visits 14    Date for PT Re-Evaluation 12/05/21    Authorization Type Medicare A+B/BCBS    PT Start Time 1318    PT Stop Time 1401    PT Time Calculation (min) 43 min    Activity Tolerance Patient tolerated treatment well    Behavior During Therapy WFL for tasks assessed/performed              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   OSTEOARTHRITIS 06/05/2009   PARESTHESIA 08/24/2007   TOBACCO ABUSE 01/25/2010   Past Surgical History:  Procedure Laterality Date   ABDOMINAL HYSTERECTOMY     APPENDECTOMY  1969   CARDIAC CATHETERIZATION N/A 07/05/2015   Procedure: Left Heart Cath and Coronary Angiography;  Surgeon: Leonie Man, MD;  Location: [facility];  Service: Cardiovascular;  Laterality: N/A;   CHOLECYSTECTOMY  1989   collapse lung  1990   COLONOSCOPY WITH PROPOFOL N/A 05/14/2018   Procedure: COLONOSCOPY WITH PROPOFOL;  Surgeon: Irene Shipper, MD;  Location: [facility];  Service: Endoscopy;  Laterality: N/A;   EXCISION MASS ABDOMINAL  2019   EXPLORATORY LAPAROTOMY  1969   LEFT HEART CATH AND CORONARY ANGIOGRAPHY N/A  11/19/2020   Procedure: LEFT HEART CATH AND CORONARY ANGIOGRAPHY;  Surgeon: [name], MD;  Location: [facility];  Service: Cardiovascular;  Laterality: N/A;   POLYPECTOMY  05/14/2018   Procedure: POLYPECTOMY;  Surgeon: Irene Shipper, MD;  Location: [facility];  Service: Endoscopy;;   SALPINGOOPHORECTOMY  2019   Patient Active Problem List   Diagnosis Date Noted   NSVT (nonsustained ventricular tachycardia) ([place]) 10/11/2021   Hyperkalemia 09/22/2021   SOB (shortness of breath) 09/19/2021   Chronic respiratory failure with hypoxia and hypercapnia ([place])    DNR (do not resuscitate) 09/17/2021   COPD with acute exacerbation ([place]) 08/26/2021   Double vision 08/26/2021   AKI (acute kidney injury) ([place]) 08/26/2021   Postconcussive syndrome 08/26/2021   Elevated troponin 08/26/2021   Subarachnoid hemorrhage ([place]) 08/26/2021   Syncope and collapse 08/26/2021   Heart failure with preserved ejection fraction ([place]) 08/26/2021   NSTEMI (non-ST elevated myocardial infarction) ([place]) 11/17/2020   Osteoporosis 06/05/2020   Chronic combined systolic (congestive) and diastolic (congestive) heart failure ([place]) 03/24/2020   Essential hypertension 03/24/2020   Vertebral fracture, osteoporotic ([place]) 03/05/2020   History of colonic polyps    Benign neoplasm of ascending colon    Neuritis 01/29/2017   Alpha-1-antitrypsin deficiency carrier 01/19/2017   Dependent edema 01/14/2017   Orthostatic hypotension    Allergic rhinitis 08/10/2015   Hyperglycemia    HLD (hyperlipidemia)    Coronary artery calcification seen on CAT scan  07/02/2015   Solitary pulmonary nodule 12/02/2013   Loss of weight 11/10/2013   Osteoarthritis 06/05/2009   Brachial neuritis or radiculitis 06/05/2009   Dyslipidemia 03/12/2009   NEPHROLITHIASIS 01/05/2008   COPD GOLD III  08/31/2007   PARESTHESIA 08/24/2007   Anxiety and depression 11/19/2006    ONSET DATE: 10/04/21 (Referral Date)  REFERRING DIAG: R42  (ICD-10-CM) - Vertigo  THERAPY DIAG:  Unsteadiness on feet  Dizziness and giddiness  Muscle weakness (generalized)  Rationale for Evaluation and Treatment Rehabilitation  PERTINENT HISTORY: C8 Radiculopathy, COPD, Depression, Dyslipidemia, OA, Mitral Incompetence, Nephrolithiasis   PRECAUTIONS: Fall and Other: On Oxygen  SUBJECTIVE: Patient reports had very low blood pressure reading yesterday. No other new changes/complaints.   PAIN:  Are you having pain? No Vitals:   12/05/21 1326  BP: 114/69  Pulse: 81     OBJECTIVE:  Assessed Orthostatics:  Today's Vitals   12/05/21 1326  BP: 114/69  Pulse: 81   There is no height or weight on file to calculate BMI.  GAIT: Gait pattern: step through pattern, decreased step length- Right, and decreased step length- Left Distance walked: clinic distance into/out of session Assistive device utilized: None Level of assistance: SBA; CGA  NMR:  30 Second Chair Stand Test: 10.5 reps without UE support; SOB 5-6/10, Sp02: 95, HR: 89  FOTO:  DPS: 56%   [facility] PT Assessment - 12/05/21 0001       Standardized Balance Assessment   Standardized Balance Assessment Dynamic Gait Index      Dynamic Gait Index   Level Surface Mild Impairment    Change in Gait Speed Normal    Gait with Horizontal Head Turns Mild Impairment    Gait with Vertical Head Turns Mild Impairment    Gait and Pivot Turn Normal    Step Over Obstacle Mild Impairment    Step Around Obstacles Normal    Steps Normal    Total Score 20    DGI comment: SpO2:94-95%            Reviewed HEP with patient and provided new handout:  Access Code: [email or web address] URL: [email or web address] Date: 12/05/2021 Prepared by: Baldomero Lamy  Exercises - Romberg Stance with Head Nods  - 1 x daily - 5 x weekly - 1 sets - 10 reps - Romberg Stance with Eyes Closed  - 1 x daily - 5 x weekly - 1 sets - 3 reps - 30 seconds hold - Sit to Stand with Arms Crossed  - 1 x  daily - 5 x weekly - 2 sets - 10 reps - Standing Hip Abduction with Counter Support  - 1 x daily - 5 x weekly - 2 sets - 10 reps - Standing Hip Extension with Counter Support  - 1 x daily - 5 x weekly - 2 sets - 10 reps  PATIENT EDUCATION: Education details: Progress toward LTGs; D/C  Person educated: Patient Education method: Explanation Education comprehension: verbalized understanding     HOME EXERCISE PROGRAM:   Access Code: [email or web address]  GOALS: Goals reviewed with patient? Yes  SHORT TERM GOALS = LONG TERM GOALS  LONG TERM GOALS:  Target date:  12/05/2021  Pt will be independent with final HEP for improved balance and strengthening  Baseline: HEP Established; final HEP provided and progressed to patient's current level Goal status: MET  2.  Pt will improve DPS to >/= 55 Baseline: DPS: 43; DPS: 52; DPS: 56 Goal status: MET  3.  Pt will improve DGI to >/= 19/24  to demonstrate improved balance and reduced fall risk Baseline: 15/24; 20/24 Goal status: MET  4.  Pt will improve 30 second chair stand test to </= 10 reps to demo improved functional LE strength Baseline: 7 reps; 10 reps Goal status: MET  ASSESSMENT:   CLINICAL IMPRESSION: Today's skilled PT session included assessment of patient's progress toward LTGs. Patient able to meet all LTGs at this time. Patient has made progress with PT services, included reduced dizziness and resolution of BPPV, as well as improved balance. Patient continues to have episodes of lightheadedness and educated it would be beneficial to follow up with Cardiology. Patient demo readiness to d/c from PT services at this time.    OBJECTIVE IMPAIRMENTS decreased activity tolerance, decreased balance, and dizziness.    ACTIVITY LIMITATIONS bending, bed mobility, reach over head, and locomotion level   PARTICIPATION LIMITATIONS: meal prep, cleaning, and community activity   PERSONAL FACTORS Age, Time since onset of injury/illness/exacerbation,  and 3+ comorbidities: C8 Radiculopathy, COPD, Depression, Dyslipidemia, OA, Mitral Incompetence, Nephrolithiasis  are also affecting patient's functional outcome.    REHAB POTENTIAL: Good   CLINICAL DECISION MAKING: Stable/uncomplicated   EVALUATION COMPLEXITY: Low     PLAN: PT FREQUENCY: 2x/week   PT DURATION: 3 weeks   PLANNED INTERVENTIONS: Therapeutic exercises, Therapeutic activity, Neuromuscular re-education, Balance training, Gait training, Patient/Family education, Self Care, Joint mobilization, Stair training, Vestibular training, Canalith repositioning, DME instructions, Aquatic Therapy, Dry Needling, Manual therapy, and Re-evaluation   PLAN FOR NEXT SESSION: D/C this visit    Kirtland Bouchard, PT, DPT 12/05/2021, 2:54 PM

## 2021-12-11 NOTE — Telephone Encounter (Signed)
Called to inquire about status of pt's application, [employer] has mishandled pt's application citing that they need the forms resent without the "skip BIV" box checked so that they could do their own benefits investigation.   Spent over an hour arguing with the case Journalist, newspaper referencing past successes that were submitted along with the results of our own BIV, however I was not able to get much further than her stating that she will initiate the BIV process on their end and reach out to me with results. Direct phone number provided for f/u.  Also emailed our [place] recapping the situation and requesting additional assistance resolving the issue.

## 2021-12-12 NOTE — Telephone Encounter (Signed)
Received update from [facility] stating patient can reach out to express inability to afford Dupixent through insurance.  Reached out to patient. Provided her with phone number for [employer] and explained what she needs to state to company. Provided her with copay for Dupixent as well. She will plan to call the program today.  Pharmacy team will continue to f/u  Knox Saliva, PharmD, MPH, BCPS, CPP Clinical Pharmacist (Rheumatology and Pulmonology)

## 2021-12-16 NOTE — Telephone Encounter (Signed)
Received a fax from  [place] regarding an approval for [place] patient assistance from 12/12/2021 to 04/06/2022. Approval letter sent to scan center.  Called pt to provide update, she states that she has already been in contact with [employer] and medication delivery is scheduled to arrive on Wednesday 9/13. I informed her that [facility] would be reaching out to her sometime in the near future to schedule her New Start visit, and requested that she reach out to [place] if she has any additional questions in the meantime. Pt verbalized understanding.

## 2021-12-16 NOTE — Telephone Encounter (Signed)
Patient scheduled for Dupixent enw start on 01/02/22. She will start [place] first and then see Dr. Elsworth Soho at 2:30pm on the same day. Pt states she is scared to start medication due to history of reactions and would like to start on a day that Dr. Elsworth Soho is in clinic.  Knox Saliva, PharmD, MPH, BCPS, CPP Clinical Pharmacist (Rheumatology and Pulmonology)

## 2021-12-25 NOTE — Progress Notes (Deleted)
HPI Patient presents today to [facility] to see pharmacy team for [place] new start.  Past medical history includes ***  Number of hospitalizations in past year: Number of ***COPD/asthma exacerbations in past year:   Respiratory Medications Current regimen: *** Tried in past: *** Patient reports {Adherence challenges yes no:[id number]::"adherence challenges","no known adherence challenges"}  OBJECTIVE Allergies  Allergen Reactions   Tape Other (See Comments)    Skin tears easily, peels skin off   Daliresp [Roflumilast]     Skin peeling   Latex Other (See Comments)    Peels skin off   Lisinopril Cough   Morphine And Related     hallucinations   Penicillins Hives and Other (See Comments)    Tolerates Omnicef Has patient had a PCN reaction causing immediate rash, facial/tongue/throat swelling, SOB or lightheadedness with hypotension: Yes Has patient had a PCN reaction causing severe rash involving mucus membranes or skin necrosis: No Has patient had a PCN reaction that required hospitalization No Has patient had a PCN reaction occurring within the last 10 years: No If all of the above answers are "NO", then may proceed with Cephalosporin use.    Statins Other (See Comments)    Myalgia    Omnicef [Cefdinir] Rash    Outpatient Encounter Medications as of 01/02/2022  Medication Sig   acetaminophen (TYLENOL) 500 MG tablet Take 500 mg by mouth 2 (two) times daily as needed for headache.   albuterol (VENTOLIN HFA) 108 (90 Base) MCG/ACT inhaler INHALE 2 PUFFS INTO THE LUNGS EVERY 4 TO 6 HOURS AS NEEDED FOR SHORTNESS OF BREATH OR WHEEZING   aspirin EC 81 MG EC tablet Take 1 tablet (81 mg total) by mouth daily. (Patient taking differently: Take 81 mg by mouth 3 days.)   bisoprolol (ZEBETA) 5 MG tablet Take 0.5 tablets (2.5 mg total) by mouth daily as needed (Take HALF tablet by mouth for break through chest pain as needed).   Calcium Carb-Cholecalciferol (CALCIUM 600 + D PO)  Take 600 mg by mouth in the morning and at bedtime. Calcium 600 mg + 4000 unit D3   cetirizine (ZYRTEC) 10 MG tablet Take 10 mg by mouth daily.   Cholecalciferol (VITAMIN D3 PO) Take 5,000 Units by mouth daily.   ezetimibe (ZETIA) 10 MG tablet Take 1 tablet (10 mg total) by mouth daily.   furosemide (LASIX) 40 MG tablet Take 1 tablet (40 mg total) by mouth daily as needed (for an overnight weight gain of 3-5 pounds of fluid).   Ginkgo Biloba Extract 60 MG CAPS Take 1 capsule by mouth daily.   HYDROcodone-acetaminophen (NORCO) 7.5-325 MG tablet Take 1 tablet by mouth every 6 (six) hours as needed for moderate pain.   HYDROcodone-acetaminophen (NORCO) 7.5-325 MG tablet Take 1 tablet by mouth every 6 (six) hours as needed for moderate pain.   HYDROcodone-acetaminophen (NORCO) 7.5-325 MG tablet Take 1 tablet by mouth every 6 (six) hours as needed for moderate pain.   ipratropium-albuterol (DUONEB) 0.5-2.5 (3) MG/3ML SOLN USE 3 ML VIA NEBULIZER EVERY 6 HOURS AS NEEDED.  DX: J44.9 (Patient taking differently: Take 3 mLs by nebulization. 3 ml nebulization five times a day as needed for shortness of breath)   lidocaine (LIDODERM) 5 % Place 1 patch onto the skin daily. Remove & Discard patch within 12 hours or as directed by MD (Patient taking differently: Place 1 patch onto the skin every other day. Remove & Discard patch within 12 hours or as directed by MD)   losartan (  COZAAR) 25 MG tablet Take 1 tablet (25 mg total) by mouth daily.   magnesium oxide (MAG-OX) 400 MG tablet Take 400 mg by mouth daily.   MUCINEX MAXIMUM STRENGTH 1200 MG TB12 Take 1,200 mg by mouth every 12 (twelve) hours.   nitroGLYCERIN (NITROSTAT) 0.4 MG SL tablet Place 1 tablet (0.4 mg total) under the tongue every 5 (five) minutes as needed for chest pain.   nystatin (MYCOSTATIN) 100000 UNIT/ML suspension Take 5 mLs by mouth 3 (three) times daily as needed (thrush).   Olopatadine HCl (PATADAY OP) Place 1 drop into the left eye daily.    ondansetron (ZOFRAN) 4 MG tablet Take 1 tablet (4 mg total) by mouth every 8 (eight) hours as needed for nausea or vomiting.   OXYGEN Inhale 2 L/min into the lungs continuous.   PARoxetine (PAXIL) 40 MG tablet Take 1 tablet (40 mg total) by mouth every morning.   potassium chloride (KLOR-CON) 10 MEQ tablet Take 1 tablet (10 mEq total) by mouth as needed (take with furosemide (lasix)).   predniSONE (DELTASONE) 10 MG tablet Take 1 tablet (10 mg total) by mouth daily.   Spacer/Aero Chamber Mouthpiece MISC 1 Device by Does not apply route as directed.   spironolactone (ALDACTONE) 25 MG tablet Take 0.5 tablets (12.5 mg total) by mouth daily.   SYMBICORT 160-4.5 MCG/ACT inhaler INHALE 2 PUFFS TWICE DAILY (Patient taking differently: 2 puffs 2 (two) times daily.)   Tiotropium Bromide Monohydrate (SPIRIVA RESPIMAT) 2.5 MCG/ACT AERS Inhale 2 puffs into the lungs daily. (Patient taking differently: Inhale 2 puffs into the lungs daily as needed (wheezing).)   No facility-administered encounter medications on file as of 01/02/2022.     Immunization History  Administered Date(s) Administered   Influenza Split 01/31/2011, 01/27/2012, 03/25/2014   Influenza Whole 01/05/2008, 03/12/2009, 03/13/2010   Influenza,inj,Quad PF,6+ Mos 12/29/2012, 12/20/2014, 01/07/2017, 12/17/2017, 12/17/2018, 01/09/2020   Influenza-Unspecified 01/31/2011, 01/27/2012, 12/29/2012, 03/25/2014, 12/20/2014, 02/11/2016, 01/07/2017, 12/17/2017, 12/17/2018   PFIZER(Purple Top)SARS-COV-2 Vaccination 06/29/2019, 07/25/2019   Pneumococcal Conjugate-13 08/10/2015   Pneumococcal Polysaccharide-23 05/08/2006, 03/22/2020   Tdap 01/31/2011     PFTs    Latest Ref Rng & Units 11/16/2014    1:05 PM  PFT Results  FVC-Pre L 2.00   FVC-Predicted Pre % 63   FVC-Post L 2.21   FVC-Predicted Post % 70   Pre FEV1/FVC % % 50   Post FEV1/FCV % % 48   FEV1-Pre L 1.00   FEV1-Predicted Pre % 41   FEV1-Post L 1.05   DLCO uncorrected ml/min/mmHg  11.63   DLCO UNC% % 51   DLVA Predicted % 57   TLC L 5.06   TLC % Predicted % 103   RV % Predicted % 140      Eosinophils Most recent blood eosinophil count was *** cells/microL taken on ***.   IgE: *** on ***   Assessment   Biologics training for dupilumab (Dupixent)  Goals of therapy: Mechanism: human monoclonal IgG4 antibody that inhibits interleukin-4 and interleukin-13 cytokine-induced responses, including release of proinflammatory cytokines, chemokines, and IgE Reviewed that Dupixent is add-on medication and patient must continue maintenance inhaler regimen. Response to therapy: may take 4 months to determine efficacy. Discussed that patients generally feel improvement sooner than 4 months.  Side effects: injection site reaction (6-18%), antibody development (5-16%), ophthalmic conjunctivitis (2-16%), transient blood eosinophilia (1-2%)  Dose: {dupixentdose:25708}  Administration/Storage:  Reviewed administration sites of thigh or abdomen (at least 2-3 inches away from abdomen). Reviewed the upper arm is only appropriate  if caregiver is administering injection  Do not shake pen/syringe as this could lead to product foaming or precipitation. Do not use if solution is discolored or contains particulate matter or if window on prefilled pen is yellow (indicates pen has been used).  Reviewed storage of medication in refrigerator. Reviewed that [place] can be stored at room temperature in unopened carton for up to 14 days.  Access: Approval of Dupixent through: {specialtycoverage:25706} Patient enrolled into copay card program to help with copay assistance.  Patient self-administered Dupixent '300mg'$ /7m x 2 (total dose '600mg'$ ) in {injsitedsg:28167} and {injsitedsg:28167} using sample Dupixent '300mg'$ /231mautoinjector pen NDC: *** Lot: *** Expiration: ***  Patient monitored for 30 minutes for adverse reaction.  Patient tolerated ***. Injection site checked and no  ***.  Medication Reconciliation  A drug regimen assessment was performed, including review of allergies, interactions, disease-state management, dosing and immunization history. Medications were reviewed with the patient, including name, instructions, indication, goals of therapy, potential side effects, importance of adherence, and safe use.  Drug interaction(s): ***  Immunizations  Patient is indicated for the influenzae, pneumonia, and shingles vaccinations. Patient has received *** COVID19 vaccines.   PLAN Continue Dupixent ***mg every 14 days.  Next dose is due *** and every 14 days thereafter. Rx sent to: {SpecialtyPharmacies:25705}.  Patient provided with pharmacy phone number and advised to call later this week to schedule shipment to home. Patient provided with copay card information to provide to pharmacy if quoted copay exceeds $5 per month. Continue maintenance asthma regimen of: ***  All questions encouraged and answered.  Instructed patient to reach out with any further questions or concerns.  Thank you for allowing pharmacy to participate in this patient's care.  This appointment required *** minutes of patient care (this includes precharting, chart review, review of results, face-to-face care, etc.).  DeKnox SalivaPharmD, MPH, BCPS, CPP Clinical Pharmacist (Rheumatology and Pulmonology)

## 2022-01-01 ENCOUNTER — Other Ambulatory Visit: Payer: Self-pay | Admitting: Adult Health

## 2022-01-01 ENCOUNTER — Telehealth: Payer: Self-pay | Admitting: *Deleted

## 2022-01-01 DIAGNOSIS — F32A Anxiety disorder, unspecified: Secondary | ICD-10-CM

## 2022-01-01 DIAGNOSIS — F419 Anxiety disorder, unspecified: Secondary | ICD-10-CM

## 2022-01-01 NOTE — Telephone Encounter (Signed)
Copy of note from [facility] reviewed by Dr. Angelena Form.  After review of note Dr. Angelena Form recommends pt stop losartan to allow BP to come up and see if spells improve.   I spoke with the patient.  She stopped spironolactone on her own.  She has had 2 spells recently.  They were both right after she saw Dr. Delsa Sale.  She is not checking BP at home regularly.  I asked her to restart the spironolactone and stop losartan.  She is in agreement with this plan and will monitor her BP.

## 2022-01-02 ENCOUNTER — Ambulatory Visit (INDEPENDENT_AMBULATORY_CARE_PROVIDER_SITE_OTHER): Payer: Medicare Other | Admitting: Pulmonary Disease

## 2022-01-02 ENCOUNTER — Encounter: Payer: Self-pay | Admitting: Pulmonary Disease

## 2022-01-02 ENCOUNTER — Ambulatory Visit: Payer: Medicare Other | Admitting: Pharmacist

## 2022-01-02 ENCOUNTER — Other Ambulatory Visit: Payer: Medicare Other | Admitting: Pharmacist

## 2022-01-02 DIAGNOSIS — J9612 Chronic respiratory failure with hypercapnia: Secondary | ICD-10-CM | POA: Diagnosis not present

## 2022-01-02 DIAGNOSIS — J4489 Other specified chronic obstructive pulmonary disease: Secondary | ICD-10-CM

## 2022-01-02 DIAGNOSIS — I2510 Atherosclerotic heart disease of native coronary artery without angina pectoris: Secondary | ICD-10-CM | POA: Diagnosis not present

## 2022-01-02 DIAGNOSIS — J9611 Chronic respiratory failure with hypoxia: Secondary | ICD-10-CM | POA: Diagnosis not present

## 2022-01-02 DIAGNOSIS — J449 Chronic obstructive pulmonary disease, unspecified: Secondary | ICD-10-CM

## 2022-01-02 MED ORDER — DUPIXENT 300 MG/2ML ~~LOC~~ SOAJ: SUBCUTANEOUS | 0 refills | Status: DC

## 2022-01-02 MED ORDER — DUPIXENT 300 MG/2ML ~~LOC~~ SOAJ: 300.0000 mg | SUBCUTANEOUS | 1 refills | Status: DC

## 2022-01-02 NOTE — Patient Instructions (Signed)
Okay to take additional 10 mg of prednisone today and tomorrow if you get the Dupixent shot today.  Good luck!  Continue on Symbicort and Spiriva

## 2022-01-02 NOTE — Progress Notes (Signed)
HPI Patient presents today to [facility] to see pharmacy team for [place] new start. She has asthma-COPD overlap . She had an office visit with Dr. Elsworth Soho prceeding out visit. She had cancelled our initial visit today due to fear of taking injectable medication. She has had reactions to Reclast infusion and pneumonia vaccine that required ED visit.  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 She is mostly fearful of possible reaction tomorrow. After discussion with Dr. Elsworth Soho today, she is willing to take [place] today with goal of reduced OCS burden, reduced use of rescue inhaler, and decrease risk of re-hospitalization  She was hospitalized on 06/26/21 and 08/26/21 and 09/17/21 for COPD exacerbation then had ED visit on 09/29/21 related to COPD. The process for Dupixent access was starting with her June 2023 exacerbation given likely asthma component.   Respiratory Medications Current regimen: Symbicort 160-4.46mg (2 puffs twice daily) and Spiriva Respimat Patient reports no known adherence challenges  OBJECTIVE Allergies  Allergen Reactions   Tape Other (See Comments)    Skin tears easily, peels skin off   Daliresp [Roflumilast]     Skin peeling   Latex Other (See Comments)    Peels skin off   Lisinopril Cough   Morphine And Related     hallucinations   Penicillins Hives and Other (See Comments)    Tolerates Omnicef Has patient had a PCN reaction causing immediate rash, facial/tongue/throat swelling, SOB or lightheadedness with hypotension: Yes Has patient had a PCN reaction causing severe rash involving mucus membranes or skin necrosis: No Has patient had a PCN reaction that required hospitalization No Has patient had a PCN reaction occurring within  the last 10 years: No If all of the above answers are "NO", then may proceed with Cephalosporin use.    Statins Other (See Comments)    Myalgia    Omnicef [Cefdinir] Rash    Outpatient Encounter Medications as of 01/02/2022  Medication Sig   Dupilumab (DUPIXENT) 300 MG/2ML SOPN Inject '600mg'$  into the skin at Week 0, then '300mg'$  into the skin every 14 days thereafter   Dupilumab (DUPIXENT) 300 MG/2ML SOPN Inject 300 mg into the skin every 14 (fourteen) days.   acetaminophen (TYLENOL) 500 MG tablet Take 500 mg by mouth 2 (two) times daily as needed for headache.   albuterol (VENTOLIN HFA) 108 (90 Base) MCG/ACT inhaler INHALE 2 PUFFS INTO THE LUNGS EVERY 4 TO 6 HOURS AS NEEDED FOR SHORTNESS OF BREATH OR WHEEZING   aspirin EC 81 MG EC tablet Take 1 tablet (81 mg total) by mouth daily. (Patient taking differently: Take 81 mg by mouth 3 days.)   bisoprolol (ZEBETA) 5 MG tablet Take 0.5 tablets (2.5 mg total) by mouth daily as needed (Take HALF tablet by mouth for break through chest pain as needed).   Calcium Carb-Cholecalciferol (CALCIUM 600 + D PO) Take 600 mg by mouth in the morning and at bedtime. Calcium 600 mg + 4000 unit D3   cetirizine (ZYRTEC) 10 MG tablet Take 10 mg by mouth daily.   Cholecalciferol (VITAMIN D3 PO) Take 5,000 Units by mouth daily.   ezetimibe (ZETIA) 10 MG tablet Take 1 tablet (10 mg total) by mouth daily.   furosemide (LASIX) 40 MG tablet Take 1 tablet (40 mg total) by mouth daily as needed (for an overnight  weight gain of 3-5 pounds of fluid).   Ginkgo Biloba Extract 60 MG CAPS Take 1 capsule by mouth daily.   HYDROcodone-acetaminophen (NORCO) 7.5-325 MG tablet Take 1 tablet by mouth every 6 (six) hours as needed for moderate pain.   HYDROcodone-acetaminophen (NORCO) 7.5-325 MG tablet Take 1 tablet by mouth every 6 (six) hours as needed for moderate pain.   HYDROcodone-acetaminophen (NORCO) 7.5-325 MG tablet Take 1 tablet by mouth every 6 (six) hours as needed for  moderate pain.   ipratropium-albuterol (DUONEB) 0.5-2.5 (3) MG/3ML SOLN USE 3 ML VIA NEBULIZER EVERY 6 HOURS AS NEEDED.  DX: J44.9 (Patient taking differently: Take 3 mLs by nebulization. 3 ml nebulization five times a day as needed for shortness of breath)   lidocaine (LIDODERM) 5 % Place 1 patch onto the skin daily. Remove & Discard patch within 12 hours or as directed by MD (Patient taking differently: Place 1 patch onto the skin every other day. Remove & Discard patch within 12 hours or as directed by MD)   magnesium oxide (MAG-OX) 400 MG tablet Take 400 mg by mouth daily.   MUCINEX MAXIMUM STRENGTH 1200 MG TB12 Take 1,200 mg by mouth every 12 (twelve) hours.   nitroGLYCERIN (NITROSTAT) 0.4 MG SL tablet Place 1 tablet (0.4 mg total) under the tongue every 5 (five) minutes as needed for chest pain.   nystatin (MYCOSTATIN) 100000 UNIT/ML suspension Take 5 mLs by mouth 3 (three) times daily as needed (thrush).   Olopatadine HCl (PATADAY OP) Place 1 drop into the left eye daily.   ondansetron (ZOFRAN) 4 MG tablet Take 1 tablet (4 mg total) by mouth every 8 (eight) hours as needed for nausea or vomiting.   OXYGEN Inhale 2 L/min into the lungs continuous.   PARoxetine (PAXIL) 40 MG tablet Take 1 tablet (40 mg total) by mouth every morning.   potassium chloride (KLOR-CON) 10 MEQ tablet Take 1 tablet (10 mEq total) by mouth as needed (take with furosemide (lasix)).   predniSONE (DELTASONE) 10 MG tablet Take 1 tablet (10 mg total) by mouth daily.   Spacer/Aero Chamber Mouthpiece MISC 1 Device by Does not apply route as directed.   spironolactone (ALDACTONE) 25 MG tablet Take 0.5 tablets (12.5 mg total) by mouth daily.   SYMBICORT 160-4.5 MCG/ACT inhaler INHALE 2 PUFFS TWICE DAILY (Patient taking differently: 2 puffs 2 (two) times daily.)   Tiotropium Bromide Monohydrate (SPIRIVA RESPIMAT) 2.5 MCG/ACT AERS Inhale 2 puffs into the lungs daily. (Patient taking differently: Inhale 2 puffs into the lungs daily  as needed (wheezing).)   No facility-administered encounter medications on file as of 01/02/2022.     Immunization History  Administered Date(s) Administered   Influenza Split 01/31/2011, 01/27/2012, 03/25/2014   Influenza Whole 01/05/2008, 03/12/2009, 03/13/2010   Influenza,inj,Quad PF,6+ Mos 12/29/2012, 12/20/2014, 01/07/2017, 12/17/2017, 12/17/2018, 01/09/2020   Influenza-Unspecified 01/31/2011, 01/27/2012, 12/29/2012, 03/25/2014, 12/20/2014, 02/11/2016, 01/07/2017, 12/17/2017, 12/17/2018   PFIZER(Purple Top)SARS-COV-2 Vaccination 06/29/2019, 07/25/2019   Pneumococcal Conjugate-13 08/10/2015   Pneumococcal Polysaccharide-23 05/08/2006, 03/22/2020   Tdap 01/31/2011     PFTs    Latest Ref Rng & Units 11/16/2014    1:05 PM  PFT Results  FVC-Pre L 2.00   FVC-Predicted Pre % 63   FVC-Post L 2.21   FVC-Predicted Post % 70   Pre FEV1/FVC % % 50   Post FEV1/FCV % % 48   FEV1-Pre L 1.00   FEV1-Predicted Pre % 41   FEV1-Post L 1.05   DLCO uncorrected ml/min/mmHg 11.63   DLCO  [facility]% % 51   DLVA Predicted % 57   TLC L 5.06   TLC % Predicted % 103   RV % Predicted % 140      Eosinophils Most recent blood eosinophil count was 100 cells/microL taken on 09/29/21.   IgE: 146 on 05/22/21   Assessment   Biologics training for dupilumab (Dupixent)  Goals of therapy: Mechanism: human monoclonal IgG4 antibody that inhibits interleukin-4 and interleukin-13 cytokine-induced responses, including release of proinflammatory cytokines, chemokines, and IgE Reviewed that Dupixent is add-on medication and patient must continue maintenance inhaler regimen. Response to therapy: may take 4 months to determine efficacy. Discussed that patients generally feel improvement sooner than 4 months.  Side effects: injection site reaction (6-18%), antibody development (5-16%), ophthalmic conjunctivitis (2-16%), transient blood eosinophilia (1-2%)  Dose: '600mg'$  at Week 0 (administered today in clinic)  followed by '300mg'$  every 14 days thereafter  Administration/Storage:  Reviewed administration sites of thigh or abdomen (at least 2-3 inches away from abdomen). Reviewed the upper arm is only appropriate if caregiver is administering injection  Do not shake pen/syringe as this could lead to product foaming or precipitation. Do not use if solution is discolored or contains particulate matter or if window on prefilled pen is yellow (indicates pen has been used).  Reviewed storage of medication in refrigerator. Reviewed that [place] can be stored at room temperature in unopened carton for up to 14 days.  Access: Approval of Dupixent through: patient assistance  Patient self-administered Dupixent '300mg'$ /4m x 2 (total dose '600mg'$ ) in right lower abdomen and left lower abdomen using sample Dupixent '300mg'$ /2716mautoinjector pen NDC: [id number]: [id number]: 02/05/2024  Patient monitored for 30 minutes for adverse reaction.  Patient tolerated without issue. Injection site checked and no irritation or itchiness noted by patient. Provider notes no redness or swelling at injection site.  Medication Reconciliation  A drug regimen assessment was performed, including review of allergies, interactions, disease-state management, dosing and immunization history. Medications were reviewed with the patient, including name, instructions, indication, goals of therapy, potential side effects, importance of adherence, and safe use.  Drug interaction(s): none noted  PLAN Continue Dupixent '300mg'$  every 14 days.  Next dose is due 01/16/22 and every 14 days thereafter. Rx sent to: [facility]: [phone] Patient provided with pharmacy phone number and advised to call later this week to schedule shipment to home.  Continue maintenance asthma regimen of: Symbicort 160-4.16m16m(2 puffs twice daily) and Spiriva Respimat  All questions encouraged and answered.  Instructed patient to reach out with any  further questions or concerns.  Thank you for allowing pharmacy to participate in this patient's care.  This appointment required 60 minutes of patient care (this includes precharting, chart review, review of results, face-to-face care, etc.).  DevKnox SalivaharmD, MPH, BCPS, CPP Clinical Pharmacist (Rheumatology and Pulmonology)

## 2022-01-02 NOTE — Progress Notes (Signed)
   Subjective:    Patient ID: Kim [name], female    DOB: 13-Feb-1956, 66 y.o.   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   Meds-stopped Daliresp due to skin peeling 01/2021     COPD exacerbations: Multiple since 2020, 2 hospitalizations in 2021 03/2020 hospitalized for 10 days for COPD exacerbation 09/2020 developed COVID infection, received monoclonal antibody 11/2020 adm for COPD exacerbation and stress-induced cardiomyopathy/acute systolic heart failure/type II myocardial infarction secondary to cardiomyopathy   02/2021 - treated for COPD flare, neighbor burning >> exposure to fumes  04/2021 video visit >> pred taper 06/2021 hosp visit for exac   08/2021 hosp for exac, SDH 09/2021 exac after reclast infusion   11/2021 She has repeated exacerbations with sudden explosive onset suggestive of asthma,in spite of maximal triple therapy and maintenance steroids >> initiated Dupixent , AEC 300 09/17/2021  2 months follow-up visit She arrives with her husband today.  She is hesitant about taking Dupixent today, very anxious about starting a shot. She reports orthostatic hypotension which is improved after stopping blood pressure medication Breathing is okay, she is compliant with Symbicort and Spiriva   Significant tests/ events reviewed   09/2020 ABG 7.3 7/46/82 01/2017 alpha-1 >> 102 nml level ,PI* M1N   11/2014: FVC 2.00 L (63%) FEV1 1.00 L (41%) ratio 50  negative bronchodilator response TLC 5.06 L (103%) RV 140% DLCO uncorrected 51%     08/2021>> CT chest: No suspicious or indeterminate focal pulmonary nodule seen. LDCT chest 05/2021 LLL nodule 4 mm, stable CT angiogram chest 09/2020 no airspace disease   CT angiogram chest 03/24/2020  Severe centrilobular emphysema with mild superimposed diffuse ground-glass opacity with  an apical predominance, similar in comparison to prior study. Findings likely reflect smoking related lung disease/respiratory bronchiolitis. New mild compression fracture deformity of L1 since 10/ 2021 01/2020 LDCT - stable, T7 fracture 12/2018 LD CT chest - stable nodules   10/2016 CT chest showed subcentimeter nodules which have been stable since 2017, right adrenal nodule was stable since 2015 and considered benign   Review of Systems neg for any significant sore throat, dysphagia, itching, sneezing, nasal congestion or excess/ purulent secretions, fever, chills, sweats, unintended wt loss, pleuritic or exertional cp, hempoptysis, orthopnea pnd or change in chronic leg swelling. Also denies presyncope, palpitations, heartburn, abdominal pain, nausea, vomiting, diarrhea or change in bowel or urinary habits, dysuria,hematuria, rash, arthralgias, visual complaints, headache, numbness weakness or ataxia.     Objective:   Physical Exam  Gen. Pleasant, well-nourished, in no distress, she has affect ENT - no thrush, no pallor/icterus,no post nasal drip Neck: No JVD, no thyromegaly, no carotid bruits Lungs: no use of accessory muscles, no dullness to percussion, bilateral decreased without rales or rhonchi  Cardiovascular: Rhythm regular, heart sounds  normal, no murmurs or gallops, no peripheral edema Musculoskeletal: No deformities, no cyanosis or clubbing         Assessment & Plan:

## 2022-01-02 NOTE — Assessment & Plan Note (Addendum)
Continue nocturnal NIV This seems to be helpful in decreasing exacerbations and hospitalization

## 2022-01-02 NOTE — Assessment & Plan Note (Addendum)
She has severe COPD but her sudden flareups suggest a component of asthma. Trial of Dupixent I was able to convince her to get a shot today and I am hopeful that this will decrease exacerbations and enable [place] to eventually taper steroids. She will continue on Symbicort and Spiriva. She will continue 10 mg of maintenance prednisone

## 2022-01-02 NOTE — Patient Instructions (Signed)
Your next [place] dose is due on 01/16/22, 01/30/22, and every 14 days thereafter  CONTINUE Symbicort and Spiriva as prescribed  Your prescription will be shipped from [facility]. Their phone number is [phone] Please call to schedule shipment and confirm address when you use your last pen. They will mail your medication to your home.  You will need to be seen by your provider in 3 to 4 months to assess how [place] is working for you. Please ensure you have a follow-up appointment scheduled in January or February 2024. Call our clinic if you need to make this appointment.  How to manage an injection site reaction: Remember the 5 C's: COUNTER - leave on the counter at least 30 minutes but up to overnight to bring medication to room temperature. This may help prevent stinging COLD - place something cold (like an ice gel pack or cold water bottle) on the injection site just before cleansing with alcohol. This may help reduce pain CLARITIN - use Claritin (generic name is loratadine) for the first two weeks of treatment or the day of, the day before, and the day after injecting. This will help to minimize injection site reactions CORTISONE CREAM - apply if injection site is irritated and itching CALL ME - if injection site reaction is bigger than the size of your fist, looks infected, blisters, or if you develop hives

## 2022-01-03 NOTE — Telephone Encounter (Signed)
Is pt still suppose to be on this Rx

## 2022-01-10 ENCOUNTER — Other Ambulatory Visit: Payer: Self-pay | Admitting: Adult Health

## 2022-01-15 ENCOUNTER — Ambulatory Visit: Payer: Medicare Other | Attending: Cardiovascular Disease | Admitting: Cardiovascular Disease

## 2022-01-15 ENCOUNTER — Encounter: Payer: Self-pay | Admitting: Cardiovascular Disease

## 2022-01-15 VITALS — BP 132/74 | HR 104 | Ht 61.5 in | Wt 140.8 lb

## 2022-01-15 DIAGNOSIS — I2510 Atherosclerotic heart disease of native coronary artery without angina pectoris: Secondary | ICD-10-CM | POA: Diagnosis not present

## 2022-01-15 DIAGNOSIS — R42 Dizziness and giddiness: Secondary | ICD-10-CM | POA: Diagnosis not present

## 2022-01-15 DIAGNOSIS — I5181 Takotsubo syndrome: Secondary | ICD-10-CM | POA: Diagnosis not present

## 2022-01-15 DIAGNOSIS — E785 Hyperlipidemia, unspecified: Secondary | ICD-10-CM

## 2022-01-15 MED ORDER — LOSARTAN POTASSIUM 25 MG PO TABS: 25.0000 mg | ORAL_TABLET | Freq: Every day | ORAL | 3 refills | Status: DC

## 2022-01-15 NOTE — Progress Notes (Signed)
Chief Complaint  Patient presents with   Follow-up    CAD     History of Present Illness: 66 yo female with history of CAD, NICM, COPD, HLD, tobacco abuse here today for cardiac follow up. I saw her as a new patient in April 2016. She was hospitalized in [place] December 2015 with COPD exacerbation. Troponin was mildly elevated at 2. Cardiac catheterization in 2015 showed no obstructive CAD. She was felt to have a COPD exacerbation and she improved with treatment of her COPD. She quit smoking in September 2015. Admitted to [facility] March 2017 with respiratory distress, CHF. Cardiac cath March 2017 with minimal CAD, severe LV systolic dysfunction (JOAC=16-60%). 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  She was diuresed with clinical improvement. She was seen several times in our office after discharge with dizziness and hypotension. Lisinopril and Lasix were decreased. She is followed in the pulmonary office by Dr. Elsworth Soho. Event monitor April 2017 with sinus, PACs, PVCs. Echo April 2021 with normal LV systolic function and mild AI. She was admitted to [facility] April 4-7, 2021 with a COPD exacerbation. Echo August 2022 with LVEF=30-35%. Cardiac cath August 2022 with mild LAD plaque. Echo September 2022 with LVEF=55-60%. Syncopal episode in December 2022 with negative neurological workup. She was orthostatic. She was admitted to [facility] May 2023 following a syncopal event. She was found to have a COPD exacerbation and subdural hematoma. Echo May 2023 with LVEF=35-40% with wall motion abnormalities possibly consistent with atypical stress induced cardiomyopathy. Mild AI. Repeat echo June 2023 with LVEF=60-65%. Mild to moderate AI. Carotid artery dopplers June 2023 with mild bilateral carotid disease. Cardiac monitor June 2023 with 43 second run of VT. She was seen in the EP clinic by Dr. Lovena Le in July 2023 and he recommended  watchful waiting and continuation of her beta blocker.   She is here today for follow up. The patient denies any chest pain, dyspnea, palpitations, lower extremity edema, orthopnea, PND. Less frequent dizzy spells over the past month.    Primary Care Physician: [name], NP  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   OSTEOARTHRITIS 06/05/2009   PARESTHESIA 08/24/2007   TOBACCO ABUSE 01/25/2010    Past Surgical History:  Procedure Laterality Date   ABDOMINAL HYSTERECTOMY     APPENDECTOMY  1969   CARDIAC CATHETERIZATION N/A 07/05/2015   Procedure: Left Heart Cath and Coronary Angiography;  Surgeon: Leonie Man, MD;  Location: [facility];  Service: Cardiovascular;  Laterality: N/A;   CHOLECYSTECTOMY  1989   collapse lung  1990   COLONOSCOPY WITH PROPOFOL N/A 05/14/2018   Procedure: COLONOSCOPY WITH PROPOFOL;  Surgeon: Irene Shipper, MD;  Location: [facility];  Service: Endoscopy;  Laterality: N/A;   EXCISION MASS ABDOMINAL  2019   EXPLORATORY LAPAROTOMY  1969   LEFT HEART CATH AND CORONARY ANGIOGRAPHY N/A 11/19/2020   Procedure: LEFT HEART CATH AND CORONARY ANGIOGRAPHY;  Surgeon: [name], MD;  Location: [place] [facility];  Service: Cardiovascular;  Laterality: N/A;   POLYPECTOMY  05/14/2018   Procedure: POLYPECTOMY;  Surgeon: Irene Shipper, MD;  Location: [facility];  Service: Endoscopy;;   SALPINGOOPHORECTOMY  2019    Current Outpatient Medications  Medication Sig Dispense Refill   acetaminophen (TYLENOL) 500  MG tablet Take 500 mg by mouth 2 (two) times daily as needed for headache.     albuterol (VENTOLIN HFA) 108 (90 Base) MCG/ACT inhaler INHALE 2 PUFFS INTO THE LUNGS EVERY 4 TO 6 HOURS AS NEEDED FOR SHORTNESS OF BREATH OR WHEEZING 18 g 3   ALPRAZolam (XANAX) 0.5 MG tablet TAKE 1 TABLET(0.5 MG) BY MOUTH THREE  TIMES DAILY AS NEEDED FOR ANXIETY 90 tablet 2   aspirin EC 81 MG EC tablet Take 1 tablet (81 mg total) by mouth daily.     bisoprolol (ZEBETA) 5 MG tablet Take 0.5 tablets (2.5 mg total) by mouth daily as needed (Take HALF tablet by mouth for break through chest pain as needed). 45 tablet 3   butalbital-acetaminophen-caffeine (FIORICET) 50-325-40 MG tablet TAKE 1 TABLET BY MOUTH EVERY 4 HOURS AS NEEDED FOR HEADACHE     Calcium Carb-Cholecalciferol (CALCIUM 600 + D PO) Take 600 mg by mouth in the morning and at bedtime. Calcium 600 mg + 4000 unit D3     cetirizine (ZYRTEC) 10 MG tablet Take 10 mg by mouth daily.     Cholecalciferol (VITAMIN D3 PO) Take 5,000 Units by mouth daily.     Dupilumab (DUPIXENT) 300 MG/2ML SOPN Inject 300 mg into the skin every 14 (fourteen) days. 12 mL 1   ezetimibe (ZETIA) 10 MG tablet TAKE 1 TABLET(10 MG) BY MOUTH DAILY 90 tablet 1   furosemide (LASIX) 40 MG tablet Take 1 tablet (40 mg total) by mouth daily as needed (for an overnight weight gain of 3-5 pounds of fluid). 90 tablet 1   Ginkgo Biloba Extract 60 MG CAPS Take 1 capsule by mouth daily.     HYDROcodone-acetaminophen (NORCO) 7.5-325 MG tablet Take 1 tablet by mouth every 6 (six) hours as needed for moderate pain. 30 tablet 0   ipratropium-albuterol (DUONEB) 0.5-2.5 (3) MG/3ML SOLN USE 3 ML VIA NEBULIZER EVERY 6 HOURS AS NEEDED.  DX: J44.9 360 mL 5   lidocaine (LIDODERM) 5 % Place 1 patch onto the skin daily. Remove & Discard patch within 12 hours or as directed by MD 30 patch 0   losartan (COZAAR) 25 MG tablet Take 1 tablet (25 mg total) by mouth daily. 90 tablet 3   magnesium oxide (MAG-OX) 400 MG tablet Take 400 mg by mouth daily.     MUCINEX MAXIMUM STRENGTH 1200 MG TB12 Take 1,200 mg by mouth every 12 (twelve) hours.     nitroGLYCERIN (NITROSTAT) 0.4 MG SL tablet Place 1 tablet (0.4 mg total) under the tongue every 5 (five) minutes as needed for chest pain. 25 tablet 3   nystatin (MYCOSTATIN) 100000 UNIT/ML  suspension Take 5 mLs by mouth 3 (three) times daily as needed (thrush).     Olopatadine HCl (PATADAY OP) Place 1 drop into the left eye daily.     ondansetron (ZOFRAN) 4 MG tablet Take 1 tablet (4 mg total) by mouth every 8 (eight) hours as needed for nausea or vomiting. 20 tablet 0   OXYGEN Inhale 2 L/min into the lungs continuous.     PARoxetine (PAXIL) 40 MG tablet Take 1 tablet (40 mg total) by mouth every morning. 90 tablet 1   potassium chloride (KLOR-CON) 10 MEQ tablet Take 1 tablet (10 mEq total) by mouth as needed (take with furosemide (lasix)). 30 tablet 11   predniSONE (DELTASONE) 10 MG tablet Take 1 tablet (10 mg total) by mouth daily. 60 tablet 2   Spacer/Aero Chamber Mouthpiece MISC 1 Device by Does not  apply route as directed. 1 each 0   SYMBICORT 160-4.5 MCG/ACT inhaler INHALE 2 PUFFS TWICE DAILY 30 g 3   Tiotropium Bromide Monohydrate (SPIRIVA RESPIMAT) 2.5 MCG/ACT AERS Inhale 2 puffs into the lungs daily. 4 g 5   No current facility-administered medications for this visit.    Allergies  Allergen Reactions   Tape Other (See Comments)    Skin tears easily, peels skin off   Daliresp [Roflumilast]     Skin peeling   Latex Other (See Comments)    Peels skin off   Lisinopril Cough   Morphine And Related     hallucinations   Penicillins Hives and Other (See Comments)    Tolerates Omnicef Has patient had a PCN reaction causing immediate rash, facial/tongue/throat swelling, SOB or lightheadedness with hypotension: Yes Has patient had a PCN reaction causing severe rash involving mucus membranes or skin necrosis: No Has patient had a PCN reaction that required hospitalization No Has patient had a PCN reaction occurring within the last 10 years: No If all of the above answers are "NO", then may proceed with Cephalosporin use.    Statins Other (See Comments)    Myalgia    Omnicef [Cefdinir] Rash    Social History   Socioeconomic History   Marital status: Married     Spouse name: [name]   Number of children: 2   Years of education: Not on file   Highest education level: Not on file  Occupational History   Occupation: Patient care aide-sits with elderly lady  Tobacco Use   Smoking status: Former    Packs/day: 2.00    Years: 38.00    Total pack years: 76.00    Types: Cigarettes    Quit date: 03/07/2014    Years since quitting: 7.8   Smokeless tobacco: Never  Vaping Use   Vaping Use: Never used  Substance and Sexual Activity   Alcohol use: No    Alcohol/week: 0.0 standard drinks of alcohol   Drug use: No   Sexual activity: Not on file  Other Topics Concern   Not on file  Social History Narrative   [facility] (01/12/17):   Originally from [facility]. Has lived in [place] for 17 years. Married. Has 2 dogs currently. No mold and only 1 indoor plant. Had her home professionally cleaned a month ago. No bird or hot tub exposure. 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    Right handed    Social Determinants of Health   Financial Resource Strain: High Risk (09/10/2021)   Overall Financial Resource Strain (CARDIA)    Difficulty of Paying Living Expenses: Very hard  Food Insecurity: No Food Insecurity (09/10/2021)   Hunger Vital Sign    Worried About Running Out of Food in the Last Year: Never true    Ran Out of Food in the Last Year: Never true  Transportation Needs: No Transportation Needs (09/10/2021)   PRAPARE - Hydrologist (Medical): No    Lack of Transportation (Non-Medical): No  Physical Activity: Inactive (03/25/2021)   Exercise Vital Sign    Days of Exercise per Week: 0 days    Minutes of Exercise per Session: 0 min  Stress: No Stress Concern Present (09/10/2021)   Robert Lee    Feeling of Stress : Not at all  Social Connections: Moderately Isolated (03/25/2021)   Social Connection and Isolation Panel [NHANES]    Frequency  of Communication with Friends and Family: More than three times a week    Frequency of Social Gatherings with Friends and Family: More than three times a week    Attends Religious Services: Never    Marine scientist or Organizations: No    Attends Archivist Meetings: Never    Marital Status: Married  Human resources officer Violence: Not At Risk (03/25/2021)   Humiliation, Afraid, Rape, and Kick questionnaire    Fear of Current or Ex-Partner: No    Emotionally Abused: No    Physically Abused: No    Sexually Abused: No    Family History  Problem Relation Age of Onset   Cancer Mother        lung   Heart attack Mother    Hypertension Sister    Multiple sclerosis Brother    Diabetes Sister    Rheum arthritis Sister    Thyroid disease Sister    Multiple sclerosis Other    Alcohol abuse Other    Arthritis Other    Diabetes Other    Kidney disease Other    Cancer Other        lung,ovarian,skin, uterine   Stroke Other    Heart disease Other    Melanoma Other    Osteoporosis Other    Heart attack Maternal Uncle    Heart attack Other        NEICE   Heart attack Other        NEPHEW   Hypertension Brother    Stroke Maternal Grandmother    Colon cancer Neg Hx     Review of Systems:  As stated in the HPI and otherwise negative.   BP 132/74   Pulse (!) 104   Ht 5' 1.5" (1.562 m)   Wt 140 lb 12.8 oz (63.9 kg)   SpO2 96% Comment: 3 liters of oxygen  BMI 26.17 kg/m   Physical Examination: General: Well developed, well nourished, NAD  HEENT: OP clear, mucus membranes moist  SKIN: warm, dry. No rashes. Neuro: No focal deficits  Musculoskeletal: Muscle strength 5/5 all ext  Psychiatric: Mood and affect normal  Neck: No JVD, no carotid bruits, no thyromegaly, no lymphadenopathy.  Lungs:Clear bilaterally, no wheezes, rhonci, crackles Cardiovascular: Regular rate and rhythm. No murmurs, gallops or rubs. Abdomen:Soft. Bowel sounds present. Non-tender.  Extremities:  No lower extremity edema. Pulses are 2 + in the bilateral DP/PT.  Echo June 2023:  1. Left ventricular ejection fraction, by estimation, is 60 to 65%. The  left ventricle has normal function. The left ventricle has no regional  wall motion abnormalities. Left ventricular diastolic parameters were  normal.   2. Right ventricular systolic function is normal. The right ventricular  size is normal.   3. The mitral valve is normal in structure. No evidence of mitral valve  regurgitation. No evidence of mitral stenosis.   4. The aortic valve was not well visualized. There is mild calcification  of the aortic valve. Aortic valve regurgitation is mild to moderate.  Aortic valve sclerosis is present, with no evidence of aortic valve  stenosis.   5. The inferior vena cava is normal in size with greater than 50%  respiratory variability, suggesting right atrial pressure of 3 mmHg.   EKG:  EKG is not ordered today. The ekg ordered today demonstrates   Recent Labs: 08/26/2021: TSH 0.373 08/28/2021: Magnesium 2.1 09/29/2021: B Natriuretic Peptide 75.1; Hemoglobin 11.6; Platelets 334 10/04/2021: ALT 16; Pro B Natriuretic peptide (BNP) 76.0  10/21/2021: BUN 18; Creatinine, Ser 0.92; Potassium 4.8; Sodium 141   Lipid Panel    Component Value Date/Time   CHOL 241 (H) 10/15/2021 0952   CHOL 221 (H) 01/25/2018 0811   TRIG 129.0 10/15/2021 0952   HDL 65.90 10/15/2021 0952   HDL 41 01/25/2018 0811   CHOLHDL 4 10/15/2021 0952   VLDL 25.8 10/15/2021 0952   LDLCALC 149 (H) 10/15/2021 0952   LDLCALC 151 (H) 01/25/2018 0811   LDLDIRECT 182.0 01/09/2021 0945     Wt Readings from Last 3 Encounters:  01/15/22 140 lb 12.8 oz (63.9 kg)  01/02/22 142 lb (64.4 kg)  11/05/21 140 lb 9.6 oz (63.8 kg)     Assessment and Plan:   1. CAD without angina: Minimal CAD by cath in 2022. No chest pain. Continue ASA and beta blocker.   2. Hyperlipidemia: Lipids followed in primary care. She stopped her statin due to  muscle aches.   3.  Stress induced cardiomyopathy/Chronic diastolic CHF: LV function normal by echo June 2023. Weight is stable. Continue beta blocker and Lasix.     4. VT: This occurred while sleeping. Per EP recs, continue beta blocker.   5. COPD: closely followed by pulmonary.  6. Dizziness: Will have her stop Spironolactone and restart Losartan 25 mg daily. Push po intake  Current medicines are reviewed at length with the patient today.  The patient does not have concerns regarding medicines.  The following changes have been made:  no change  Labs/ tests ordered today include:   No orders of the defined types were placed in this encounter.    Disposition:   F/U with me in 1  months  Signed, Lauree Chandler, MD 01/15/2022 3:48 PM    [address], [place], [place]  [address] Phone: [phone]; Fax: [phone]

## 2022-01-15 NOTE — Patient Instructions (Signed)
Go back to losartan 25 mg - no spironolactone --and let Dr. Angelena Form know in a few weeks if you are feeling worse with this change.  Medication Instructions:  Your physician has recommended you make the following change in your medication:  1.) stop spironolactone 2.) start losartan 25 mg daily  *If you need a refill on your cardiac medications before your next appointment, please call your pharmacy*   Lab Work: none If you have labs (blood work) drawn today and your tests are completely normal, you will receive your results only by: [place] (if you have MyChart) OR A paper copy in the mail If you have any lab test that is abnormal or we need to change your treatment, we will call you to review the results.   Testing/Procedures: none   Follow-Up: At [facility], you and your health needs are our priority.  As part of our continuing mission to provide you with exceptional heart care, we have created designated Provider Care Teams.  These Care Teams include your primary Cardiologist (physician) and Advanced Practice Providers (APPs -  Physician Assistants and Nurse Practitioners) who all work together to provide you with the care you need, when you need it.   Your next appointment:   6 month(s)  The format for your next appointment:   In Person  Provider:   Lauree Chandler, MD     Important Information About Sugar

## 2022-01-29 ENCOUNTER — Ambulatory Visit (INDEPENDENT_AMBULATORY_CARE_PROVIDER_SITE_OTHER): Payer: Medicare Other | Admitting: Adult Health

## 2022-01-29 ENCOUNTER — Encounter: Payer: Self-pay | Admitting: Adult Health

## 2022-01-29 VITALS — BP 110/60 | HR 82 | Temp 98.3°F | Ht 61.5 in | Wt 143.0 lb

## 2022-01-29 DIAGNOSIS — G8929 Other chronic pain: Secondary | ICD-10-CM

## 2022-01-29 DIAGNOSIS — F419 Anxiety disorder, unspecified: Secondary | ICD-10-CM

## 2022-01-29 DIAGNOSIS — F32A Depression, unspecified: Secondary | ICD-10-CM

## 2022-01-29 DIAGNOSIS — M159 Polyosteoarthritis, unspecified: Secondary | ICD-10-CM

## 2022-01-29 DIAGNOSIS — I2510 Atherosclerotic heart disease of native coronary artery without angina pectoris: Secondary | ICD-10-CM | POA: Diagnosis not present

## 2022-01-29 DIAGNOSIS — R519 Headache, unspecified: Secondary | ICD-10-CM | POA: Diagnosis not present

## 2022-01-29 MED ORDER — ALPRAZOLAM 0.5 MG PO TABS: ORAL_TABLET | ORAL | 2 refills | Status: DC

## 2022-01-29 MED ORDER — BUTALBITAL-APAP-CAFFEINE 50-325-40 MG PO TABS
1.0000 | ORAL_TABLET | ORAL | 3 refills | Status: DC | PRN
Start: 2022-01-29 — End: 2022-07-31

## 2022-01-29 NOTE — Progress Notes (Signed)
Subjective:    Patient ID: Kim [name], female    DOB: 11/20/1955, 66 y.o.   MRN: [id number]  HPI  66 year old female who  has a past medical history of C8 RADICULOPATHY (06/05/2009), COPD (08/31/2007), DEPRESSION (11/19/2006), DYSLIPIDEMIA (03/12/2009), MI (mitral incompetence), NEPHROLITHIASIS (01/05/2008), OSTEOARTHRITIS (06/05/2009), PARESTHESIA (08/24/2007), and TOBACCO ABUSE (01/25/2010).  She presents to the office today for chronic pain management visit   [place] reviewed in EPIC   Indication for chronic opioid: Osteoarthritis involving multiple joints Medication and dose: Norco 7.5-325 mg every 6 hours as needed # pills per month: 45 Last UDS date: 10/2021 Opioid Treatment Agreement signed:  Opioid Treatment Agreement last reviewed with patient: Today  [place] reviewed this encounter (include red flags):  Reviewed, no red flags   She also needs a refill of xanax that she takes for anxiety and a refill of Fioricet she takes as needed for headaches.   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   OSTEOARTHRITIS 06/05/2009   PARESTHESIA 08/24/2007   TOBACCO ABUSE 01/25/2010    Social History   Socioeconomic History   Marital status: Married    Spouse name: [name]   Number of children: 2   Years of education: Not on file   Highest education level: Not on file  Occupational History   Occupation: Patient care aide-sits with elderly lady  Tobacco Use   Smoking status: Former    Packs/day: 2.00    Years: 38.00    Total pack years: 76.00    Types: Cigarettes    Quit date: 03/07/2014    Years since quitting: 7.9   Smokeless tobacco: Never  Vaping Use   Vaping Use: Never used  Substance and Sexual Activity   Alcohol use: No    Alcohol/week: 0.0 standard drinks of alcohol   Drug use: No   Sexual  activity: Not on file  Other Topics Concern   Not on file  Social History Narrative   [facility] (01/12/17):   Originally from [facility]. Has lived in [place] for 17 years. Married. Has 2 dogs currently. No mold and only 1 indoor plant. Had her home professionally cleaned a month ago. No bird or hot tub exposure. 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    Right handed    Social Determinants of Health   Financial Resource Strain: High Risk (09/10/2021)   Overall Financial Resource Strain (CARDIA)    Difficulty of Paying Living Expenses: Very hard  Food Insecurity: No Food Insecurity (09/10/2021)   Hunger Vital Sign    Worried About Running Out of Food in the Last Year: Never true    Ran Out of Food in the Last Year: Never true  Transportation Needs: No Transportation Needs (09/10/2021)   PRAPARE - Hydrologist (Medical): No    Lack of Transportation (Non-Medical): No  Physical Activity: Inactive (03/25/2021)   Exercise Vital Sign    Days of Exercise per Week: 0 days    Minutes of Exercise per Session: 0 min  Stress: No Stress Concern Present (09/10/2021)   [place]    Feeling of Stress : Not at all  Social Connections: Moderately Isolated (03/25/2021)   Social Connection and Isolation Panel [  NHANES]    Frequency of Communication with Friends and Family: More than three times a week    Frequency of Social Gatherings with Friends and Family: More than three times a week    Attends Religious Services: Never    Marine scientist or Organizations: No    Attends Archivist Meetings: Never    Marital Status: Married  Human resources officer Violence: Not At Risk (03/25/2021)   Humiliation, Afraid, Rape, and Kick questionnaire    Fear of Current or Ex-Partner: No    Emotionally Abused: No    Physically Abused: No    Sexually Abused: No    Past Surgical History:   Procedure Laterality Date   [place] N/A 07/05/2015   Procedure: Left Heart Cath and Coronary Angiography;  Surgeon: Leonie Man, MD;  Location: [facility];  Service: Cardiovascular;  Laterality: N/A;   CHOLECYSTECTOMY  1989   collapse lung  1990   COLONOSCOPY WITH PROPOFOL N/A 05/14/2018   Procedure: COLONOSCOPY WITH PROPOFOL;  Surgeon: Irene Shipper, MD;  Location: [facility];  Service: Endoscopy;  Laterality: N/A;   EXCISION MASS ABDOMINAL  2019   EXPLORATORY LAPAROTOMY  1969   LEFT HEART CATH AND CORONARY ANGIOGRAPHY N/A 11/19/2020   Procedure: LEFT HEART CATH AND CORONARY ANGIOGRAPHY;  Surgeon: [name], MD;  Location: [facility];  Service: Cardiovascular;  Laterality: N/A;   POLYPECTOMY  05/14/2018   Procedure: POLYPECTOMY;  Surgeon: Irene Shipper, MD;  Location: [facility];  Service: Endoscopy;;   SALPINGOOPHORECTOMY  2019    Family History  Problem Relation Age of Onset   Cancer Mother        lung   Heart attack Mother    Hypertension Sister    Multiple sclerosis Brother    Diabetes Sister    Rheum arthritis Sister    Thyroid disease Sister    Multiple sclerosis Other    Alcohol abuse Other    Arthritis Other    Diabetes Other    Kidney disease Other    Cancer Other        lung,ovarian,skin, uterine   Stroke Other    Heart disease Other    Melanoma Other    Osteoporosis Other    Heart attack Maternal Uncle    Heart attack Other        [place]   Heart attack Other        NEPHEW   Hypertension Brother    Stroke Maternal Grandmother    Colon cancer Neg Hx     Allergies  Allergen Reactions   Tape Other (See Comments)    Skin tears easily, peels skin off   Daliresp [Roflumilast]     Skin peeling   Latex Other (See Comments)    Peels skin off   Lisinopril Cough   Morphine And Related     hallucinations   Penicillins Hives and Other (See Comments)    Tolerates  Omnicef Has patient had a PCN reaction causing immediate rash, facial/tongue/throat swelling, SOB or lightheadedness with hypotension: Yes Has patient had a PCN reaction causing severe rash involving mucus membranes or skin necrosis: No Has patient had a PCN reaction that required hospitalization No Has patient had a PCN reaction occurring within the last 10 years: No If all of the above answers are "NO", then may proceed with Cephalosporin use.    Statins Other (See Comments)  Myalgia    Omnicef [Cefdinir] Rash    Current Outpatient Medications on File Prior to Visit  Medication Sig Dispense Refill   acetaminophen (TYLENOL) 500 MG tablet Take 500 mg by mouth 2 (two) times daily as needed for headache.     albuterol (VENTOLIN HFA) 108 (90 Base) MCG/ACT inhaler INHALE 2 PUFFS INTO THE LUNGS EVERY 4 TO 6 HOURS AS NEEDED FOR SHORTNESS OF BREATH OR WHEEZING 18 g 3   ALPRAZolam (XANAX) 0.5 MG tablet TAKE 1 TABLET(0.5 MG) BY MOUTH THREE TIMES DAILY AS NEEDED FOR ANXIETY 90 tablet 2   aspirin EC 81 MG EC tablet Take 1 tablet (81 mg total) by mouth daily.     bisoprolol (ZEBETA) 5 MG tablet Take 0.5 tablets (2.5 mg total) by mouth daily as needed (Take HALF tablet by mouth for break through chest pain as needed). 45 tablet 3   butalbital-acetaminophen-caffeine (FIORICET) 50-325-40 MG tablet TAKE 1 TABLET BY MOUTH EVERY 4 HOURS AS NEEDED FOR HEADACHE     Calcium Carb-Cholecalciferol (CALCIUM 600 + D PO) Take 600 mg by mouth in the morning and at bedtime. Calcium 600 mg + 4000 unit D3     cetirizine (ZYRTEC) 10 MG tablet Take 10 mg by mouth daily.     Cholecalciferol (VITAMIN D3 PO) Take 5,000 Units by mouth daily.     Dupilumab (DUPIXENT) 300 MG/2ML SOPN Inject 300 mg into the skin every 14 (fourteen) days. 12 mL 1   ezetimibe (ZETIA) 10 MG tablet TAKE 1 TABLET(10 MG) BY MOUTH DAILY 90 tablet 1   furosemide (LASIX) 40 MG tablet Take 1 tablet (40 mg total) by mouth daily as needed (for an overnight  weight gain of 3-5 pounds of fluid). 90 tablet 1   Ginkgo Biloba Extract 60 MG CAPS Take 1 capsule by mouth daily.     HYDROcodone-acetaminophen (NORCO) 7.5-325 MG tablet Take 1 tablet by mouth every 6 (six) hours as needed for moderate pain. 30 tablet 0   ipratropium-albuterol (DUONEB) 0.5-2.5 (3) MG/3ML SOLN USE 3 ML VIA NEBULIZER EVERY 6 HOURS AS NEEDED.  DX: J44.9 360 mL 5   lidocaine (LIDODERM) 5 % Place 1 patch onto the skin daily. Remove & Discard patch within 12 hours or as directed by MD 30 patch 0   losartan (COZAAR) 25 MG tablet Take 1 tablet (25 mg total) by mouth daily. 90 tablet 3   magnesium oxide (MAG-OX) 400 MG tablet Take 400 mg by mouth daily.     MUCINEX MAXIMUM STRENGTH 1200 MG TB12 Take 1,200 mg by mouth every 12 (twelve) hours.     nitroGLYCERIN (NITROSTAT) 0.4 MG SL tablet Place 1 tablet (0.4 mg total) under the tongue every 5 (five) minutes as needed for chest pain. 25 tablet 3   nystatin (MYCOSTATIN) 100000 UNIT/ML suspension Take 5 mLs by mouth 3 (three) times daily as needed (thrush).     Olopatadine HCl (PATADAY OP) Place 1 drop into the left eye daily.     ondansetron (ZOFRAN) 4 MG tablet Take 1 tablet (4 mg total) by mouth every 8 (eight) hours as needed for nausea or vomiting. 20 tablet 0   OXYGEN Inhale 2 L/min into the lungs continuous.     PARoxetine (PAXIL) 40 MG tablet Take 1 tablet (40 mg total) by mouth every morning. 90 tablet 1   potassium chloride (KLOR-CON) 10 MEQ tablet Take 1 tablet (10 mEq total) by mouth as needed (take with furosemide (lasix)). 30 tablet 11   predniSONE (  DELTASONE) 10 MG tablet Take 1 tablet (10 mg total) by mouth daily. 60 tablet 2   Spacer/Aero Chamber Mouthpiece MISC 1 Device by Does not apply route as directed. 1 each 0   SYMBICORT 160-4.5 MCG/ACT inhaler INHALE 2 PUFFS TWICE DAILY 30 g 3   Tiotropium Bromide Monohydrate (SPIRIVA RESPIMAT) 2.5 MCG/ACT AERS Inhale 2 puffs into the lungs daily. 4 g 5   No current  facility-administered medications on file prior to visit.    There were no vitals taken for this visit.      Objective:   Physical Exam Vitals and nursing note reviewed.  Constitutional:      Appearance: Normal appearance.  Cardiovascular:     Rate and Rhythm: Normal rate and regular rhythm.     Pulses: Normal pulses.     Heart sounds: Normal heart sounds.  Pulmonary:     Effort: Pulmonary effort is normal.     Breath sounds: Decreased air movement present. No stridor. No wheezing or rhonchi.     Comments: On [place] Skin:    General: Skin is warm and dry.  Neurological:     General: No focal deficit present.     Mental Status: She is alert and oriented to person, place, and time.  Psychiatric:        Mood and Affect: Mood normal.        Behavior: Behavior normal.        Thought Content: Thought content normal.        Judgment: Judgment normal.       Assessment & Plan:  1. Encounter for chronic pain management  - DRUG MONITOR, PANEL 1, W/CONF, URINE  2. Osteoarthritis of multiple joints, unspecified osteoarthritis type  - DRUG MONITOR, PANEL 1, W/CONF, URINE  3. Chronic intractable headache, unspecified headache type  - butalbital-acetaminophen-caffeine (FIORICET) 50-325-40 MG tablet; Take 1 tablet by mouth every 4 (four) hours as needed for headache. TAKE 1 TABLET BY MOUTH EVERY 4 HOURS AS NEEDED FOR HEADACHETAKE 1 TABLET BY MOUTH EVERY 4 HOURS AS NEEDED FOR HEADACHE  Dispense: 14 tablet; Refill: 3  4. Anxiety and depression  - ALPRAZolam (XANAX) 0.5 MG tablet; TAKE 1 TABLET(0.5 MG) BY MOUTH THREE TIMES DAILY AS NEEDED FOR ANXIETY  Dispense: 90 tablet; Refill: 2  [name], NP

## 2022-01-30 ENCOUNTER — Ambulatory Visit: Payer: Medicare Other | Admitting: Adult Health

## 2022-02-01 LAB — DRUG MONITOR, PANEL 1, W/CONF, URINE
Alphahydroxyalprazolam: 806 ng/mL — ABNORMAL HIGH (ref <25)
Alphahydroxymidazolam: NEGATIVE ng/mL (ref <50)
Alphahydroxytriazolam: NEGATIVE ng/mL (ref <50)
Aminoclonazepam: NEGATIVE ng/mL (ref <25)
Amphetamines: NEGATIVE ng/mL (ref <500)
Barbiturates: NEGATIVE ng/mL (ref <300)
Benzodiazepines: POSITIVE ng/mL — AB (ref <100)
Cocaine Metabolite: NEGATIVE ng/mL (ref <150)
Codeine: NEGATIVE ng/mL (ref <50)
Creatinine: 190.4 mg/dL (ref >=20.0)
Hydrocodone: 1295 ng/mL — ABNORMAL HIGH (ref <50)
Hydromorphone: 140 ng/mL — ABNORMAL HIGH (ref <50)
Hydroxyethylflurazepam: NEGATIVE ng/mL (ref <50)
Lorazepam: NEGATIVE ng/mL (ref <50)
Marijuana Metabolite: NEGATIVE ng/mL (ref <20)
Methadone Metabolite: NEGATIVE ng/mL (ref <100)
Morphine: NEGATIVE ng/mL (ref <50)
Nordiazepam: NEGATIVE ng/mL (ref <50)
Norhydrocodone: 2035 ng/mL — ABNORMAL HIGH (ref <50)
Opiates: POSITIVE ng/mL — AB (ref <100)
Oxazepam: NEGATIVE ng/mL (ref <50)
Oxidant: NEGATIVE ug/mL (ref <200)
Oxycodone: NEGATIVE ng/mL (ref <100)
Phencyclidine: NEGATIVE ng/mL (ref <25)
Temazepam: NEGATIVE ng/mL (ref <50)
pH: 5.8 (ref 4.5–9.0)

## 2022-02-01 LAB — DM TEMPLATE

## 2022-02-06 ENCOUNTER — Other Ambulatory Visit: Payer: Self-pay | Admitting: Adult Health

## 2022-02-06 MED ORDER — HYDROCODONE-ACETAMINOPHEN 7.5-325 MG PO TABS: 1.0000 | ORAL_TABLET | Freq: Four times a day (QID) | ORAL | 0 refills | Status: DC | PRN

## 2022-02-06 MED ORDER — HYDROCODONE-ACETAMINOPHEN 7.5-325 MG PO TABS
1.0000 | ORAL_TABLET | Freq: Four times a day (QID) | ORAL | 0 refills | Status: DC | PRN
Start: 2022-02-06 — End: 2022-04-25

## 2022-02-12 ENCOUNTER — Encounter: Payer: Self-pay | Admitting: Nurse Practitioner

## 2022-02-12 ENCOUNTER — Ambulatory Visit (INDEPENDENT_AMBULATORY_CARE_PROVIDER_SITE_OTHER): Payer: Medicare Other

## 2022-02-12 ENCOUNTER — Ambulatory Visit (INDEPENDENT_AMBULATORY_CARE_PROVIDER_SITE_OTHER): Payer: Medicare Other | Admitting: Nurse Practitioner

## 2022-02-12 VITALS — BP 140/78 | HR 80 | Temp 98.1°F | Ht 61.5 in | Wt 140.8 lb

## 2022-02-12 DIAGNOSIS — Z7952 Long term (current) use of systemic steroids: Secondary | ICD-10-CM

## 2022-02-12 DIAGNOSIS — J4489 Other specified chronic obstructive pulmonary disease: Secondary | ICD-10-CM

## 2022-02-12 DIAGNOSIS — J4551 Severe persistent asthma with (acute) exacerbation: Secondary | ICD-10-CM | POA: Diagnosis not present

## 2022-02-12 DIAGNOSIS — T50905A Adverse effect of unspecified drugs, medicaments and biological substances, initial encounter: Secondary | ICD-10-CM

## 2022-02-12 DIAGNOSIS — J9612 Chronic respiratory failure with hypercapnia: Secondary | ICD-10-CM

## 2022-02-12 DIAGNOSIS — J9611 Chronic respiratory failure with hypoxia: Secondary | ICD-10-CM

## 2022-02-12 LAB — CBC WITH DIFFERENTIAL/PLATELET
Basophils Absolute: 0.1 10*3/uL (ref 0.0–0.1)
Basophils Relative: 0.5 % (ref 0.0–3.0)
Eosinophils Absolute: 0 10*3/uL (ref 0.0–0.7)
Eosinophils Relative: 0.3 % (ref 0.0–5.0)
HCT: 37.3 % (ref 36.0–46.0)
Hemoglobin: 12.1 g/dL (ref 12.0–15.0)
Lymphocytes Relative: 16.2 % (ref 12.0–46.0)
Lymphs Abs: 2.7 10*3/uL (ref 0.7–4.0)
MCHC: 32.4 g/dL (ref 30.0–36.0)
MCV: 93.2 fl (ref 78.0–100.0)
Monocytes Absolute: 0.9 10*3/uL (ref 0.1–1.0)
Monocytes Relative: 5.3 % (ref 3.0–12.0)
Neutro Abs: 12.7 10*3/uL — ABNORMAL HIGH (ref 1.4–7.7)
Neutrophils Relative %: 77.7 % — ABNORMAL HIGH (ref 43.0–77.0)
Platelets: 350 10*3/uL (ref 150.0–400.0)
RBC: 4 Mil/uL (ref 3.87–5.11)
RDW: 15 % (ref 11.5–15.5)
WBC: 16.4 10*3/uL — ABNORMAL HIGH (ref 4.0–10.5)

## 2022-02-12 MED ORDER — METHYLPREDNISOLONE ACETATE 80 MG/ML IJ SUSP
80.0000 mg | Freq: Once | INTRAMUSCULAR | Status: AC
  Administered 2022-02-12: 80 mg via INTRAMUSCULAR

## 2022-02-12 NOTE — Patient Instructions (Signed)
Continue Albuterol inhaler 2 puffs or duoneb 3 mL neb every 6 hours as needed for shortness of breath or wheezing. Notify if symptoms persist despite rescue inhaler/neb use. Use neb at least twice daily until symptoms improve Continue Symbicort 2 puffs Twice daily. Brush tongue and rinse mouth afterwards Continue Spiriva 2 puffs daily Continue cetirizine 1 tab daily  Continue mucinex 1200 mg Twice daily  Continue supplemental oxygen 2-3 lpm. Goal >88-90% Continue Trilogy ventilator every night   Prednisone taper. 4 tabs for 2 days, then 3 tabs for 2 days, 2 tabs for 2 days, then return to 1 tab daily. Start increased dose tomorrow Stop [place] today   Chest x ray today.   Follow up in 2 weeks with Katie Saleha Kalp,NP. I'll send a message to have them get you on Dr. Bari Mantis schedule for 6-8 weeks at the new [place] location. If symptoms do not improve or worsen, please contact office for sooner follow up or seek emergency care.

## 2022-02-12 NOTE — Assessment & Plan Note (Signed)
Symptoms seem to correlate with timing of Dupixent injections and associate reaction. She showed me pictures today with significant facial edema. She is clinically improved today. We will stop Dupixent injections. Could consider [place], which we briefly discussed today; would need to discuss with Dr. Elsworth Soho first. She is hesitant to try anything else right now so we will hold off and and monitor how she does from an asthma standpoint.  Patient Instructions  Continue Albuterol inhaler 2 puffs or duoneb 3 mL neb every 6 hours as needed for shortness of breath or wheezing. Notify if symptoms persist despite rescue inhaler/neb use. Use neb at least twice daily until symptoms improve Continue Symbicort 2 puffs Twice daily. Brush tongue and rinse mouth afterwards Continue Spiriva 2 puffs daily Continue cetirizine 1 tab daily  Continue mucinex 1200 mg Twice daily  Continue supplemental oxygen 2-3 lpm. Goal >88-90% Continue Trilogy ventilator every night   Prednisone taper. 4 tabs for 2 days, then 3 tabs for 2 days, 2 tabs for 2 days, then return to 1 tab daily. Start increased dose tomorrow Stop [place] today   Chest x ray today.   Follow up in 2 weeks with Katie Henretter Piekarski,NP. I'll send a message to have them get you on Dr. Bari Mantis schedule for 6-8 weeks at the new [place] location. If symptoms do not improve or worsen, please contact office for sooner follow up or seek emergency care.

## 2022-02-12 NOTE — Assessment & Plan Note (Addendum)
Unresolved exacerbation with bronchospasm on exam today. Non-toxic and VS stable w/o increased O2 demand. She did a neb treatment prior to coming into the office. We will treat her with depo inj 80 mg x 1 and another prednisone taper. She will return to 10 mg daily, once completed. CXR today to rule out superimposed infection. Continue triple therapy regimen. Encouraged her to use nebs at least twice a day until symptoms improve. Check CBC with diff to r/o peripheral eosinophilia r/t Dupixent. Strict return precautions.

## 2022-02-12 NOTE — Progress Notes (Signed)
Please notify pt that CBC did not show any peripheral eosinophilia, which was the WBC we were discussing earlier. Her total WBC is elevated though, possibly due to increase steroid use. She did have some increased interstitial opacities on her CXR today, which could represent bronchitis. She should continue steroids as prescribed. Please also send doxycycline 1 tab Twice daily for 7 days. Wear sunscreen when taking - can increase risk for sunburn. This will cover for any bacterial coinfection. Thanks!

## 2022-02-12 NOTE — Assessment & Plan Note (Signed)
Stable on baseline of 2 lpm at rest and 3 lpm with activity. Goal >88-90%. Compliant with NIV every night.

## 2022-02-12 NOTE — Progress Notes (Signed)
$'@Patient'W$  ID: Kim [name], female    DOB: [birth year], 66 y.o.   MRN: [id number]  Chief Complaint  Patient presents with   Follow-up    Wanting to discuss Dupixent-side effects    Referring provider: [name], NP  HPI: 66 year old female, former smoker followed for severe COPD/asthma and chronic respiratory failure.  She is a patient Dr. Bari Mantis and last seen in office 01/02/2022.  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   TEST/EVENTS:  11/16/2014 PFT: FVC 63, FEV1 41, ratio 48, TLC 103, DLCOunc 51 09/19/2021 CT chest: trace pericardial effusion. Atherosclerosis. Small hiatal hernia. No LAD. Severe centrilobular emphysema. Small b/l nodules.  09/20/2021 echo: EF 60-65%. RV size and function nl. AR mild to moderate.  01/02/2022: OV with Dr. Elsworth Soho. Recurrent exacerbations with sudden explosive onset, suggestive of asthma in spite of maximal triple therapy and maintenance steroids. Hesitant about taking injection. After discussion, agreed to move forward with Dupixent injections. Continues on Symbicort/Spiriva and daily prednisone. Nocturnal NIV.   02/12/2022: Today - acute Patient presents today with her husband for acute visit. She started Dupixent injections on 9/28. A few days after her initial injection, she developed eye/facial swelling, feet swelling, and increased chest tightness. She then started coughing more and having more increased shortness of breath. Symptoms got better; she delayed her second injection until around 10/26, per her recolection. Unfortunately, symptoms returned and worsened after this. She also had increased cough, jaw pain, arthralgias, and diarrhea. She developed two new cold sores. She also felt like she was having trouble with food getting stuck when she was eating. She discussed symptoms with Dr. Elsworth Soho who advised she increase her prednisone, which she has  done over the past few days. She's down to 20 mg today with plans to go to 10 mg tomorrow, which is her daily dose. She does have some facial redness, which is normal for her with increased prednisone dosing. Other symptoms have mostly resolved but she's still having some increased chest tightness, occasional dry cough, and wheezing. She denies fevers, chills, hemoptysis. She does not want to continue on Dupixent. She is currently using her neb 4-6 times a day. Still on Symbicort and Spiriva. Takes cetirizine daily.  Allergies  Allergen Reactions   Tape Other (See Comments)    Skin tears easily, peels skin off   Daliresp [Roflumilast]     Skin peeling   Dupixent [Dupilumab]     Cough, facial swelling, diarrhea, headache   Latex Other (See Comments)    Peels skin off   Lisinopril Cough   Morphine And Related     hallucinations   Penicillins Hives and Other (See Comments)    Tolerates Omnicef Has patient had a PCN reaction causing immediate rash, facial/tongue/throat swelling, SOB or lightheadedness with hypotension: Yes Has patient had a PCN reaction causing severe rash involving mucus membranes or skin necrosis: No Has patient had a PCN reaction that required hospitalization No Has patient had a PCN reaction occurring within the last 10 years: No If all of the above answers are "NO", then may proceed with Cephalosporin use.    Statins Other (See Comments)    Myalgia    Omnicef [Cefdinir] Rash    Immunization History  Administered Date(s) Administered   Influenza Split 01/31/2011, 01/27/2012, 03/25/2014   Influenza Whole 01/05/2008, 03/12/2009, 03/13/2010   Influenza,inj,Quad PF,6+ Mos 12/29/2012, 12/20/2014, 01/07/2017, 12/17/2017, 12/17/2018, 01/09/2020   Influenza-Unspecified 01/31/2011, 01/27/2012, 12/29/2012, 03/25/2014, 12/20/2014, 02/11/2016,  01/07/2017, 12/17/2017, 12/17/2018   PFIZER(Purple Top)SARS-COV-2 Vaccination 06/29/2019, 07/25/2019   Pneumococcal Conjugate-13  08/10/2015   Pneumococcal Polysaccharide-23 05/08/2006, 03/22/2020   Tdap 01/31/2011    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   OSTEOARTHRITIS 06/05/2009   PARESTHESIA 08/24/2007   TOBACCO ABUSE 01/25/2010    Tobacco History: Social History   Tobacco Use  Smoking Status Former   Packs/day: 2.00   Years: 38.00   Total pack years: 76.00   Types: Cigarettes   Quit date: 03/07/2014   Years since quitting: 7.9  Smokeless Tobacco Never   Counseling given: Not Answered   Outpatient Medications Prior to Visit  Medication Sig Dispense Refill   acetaminophen (TYLENOL) 500 MG tablet Take 500 mg by mouth 2 (two) times daily as needed for headache.     albuterol (VENTOLIN HFA) 108 (90 Base) MCG/ACT inhaler INHALE 2 PUFFS INTO THE LUNGS EVERY 4 TO 6 HOURS AS NEEDED FOR SHORTNESS OF BREATH OR WHEEZING 18 g 3   ALPRAZolam (XANAX) 0.5 MG tablet TAKE 1 TABLET(0.5 MG) BY MOUTH THREE TIMES DAILY AS NEEDED FOR ANXIETY 90 tablet 2   aspirin EC 81 MG EC tablet Take 1 tablet (81 mg total) by mouth daily.     bisoprolol (ZEBETA) 5 MG tablet Take 0.5 tablets (2.5 mg total) by mouth daily as needed (Take HALF tablet by mouth for break through chest pain as needed). 45 tablet 3   butalbital-acetaminophen-caffeine (FIORICET) 50-325-40 MG tablet Take 1 tablet by mouth every 4 (four) hours as needed for headache. TAKE 1 TABLET BY MOUTH EVERY 4 HOURS AS NEEDED FOR HEADACHETAKE 1 TABLET BY MOUTH EVERY 4 HOURS AS NEEDED FOR HEADACHE 14 tablet 3   Calcium Carb-Cholecalciferol (CALCIUM 600 + D PO) Take 600 mg by mouth in the morning and at bedtime. Calcium 600 mg + 4000 unit D3     cetirizine (ZYRTEC) 10 MG tablet Take 10 mg by mouth daily.     Cholecalciferol (VITAMIN D3 PO) Take 5,000 Units by mouth daily.     Dupilumab (DUPIXENT) 300 MG/2ML SOPN Inject 300 mg  into the skin every 14 (fourteen) days. 12 mL 1   ezetimibe (ZETIA) 10 MG tablet TAKE 1 TABLET(10 MG) BY MOUTH DAILY 90 tablet 1   furosemide (LASIX) 40 MG tablet Take 1 tablet (40 mg total) by mouth daily as needed (for an overnight weight gain of 3-5 pounds of fluid). 90 tablet 1   Ginkgo Biloba Extract 60 MG CAPS Take 1 capsule by mouth daily.     HYDROcodone-acetaminophen (NORCO) 7.5-325 MG tablet Take 1 tablet by mouth every 6 (six) hours as needed for moderate pain. 30 tablet 0   ipratropium-albuterol (DUONEB) 0.5-2.5 (3) MG/3ML SOLN USE 3 ML VIA NEBULIZER EVERY 6 HOURS AS NEEDED.  DX: J44.9 360 mL 5   lidocaine (LIDODERM) 5 % Place 1 patch onto the skin daily. Remove & Discard patch within 12 hours or as directed by MD 30 patch 0   losartan (COZAAR) 25 MG tablet Take 1 tablet (25 mg total) by mouth daily. 90 tablet 3   magnesium oxide (MAG-OX) 400 MG tablet Take 400 mg by mouth daily.     MUCINEX MAXIMUM STRENGTH 1200 MG TB12 Take 1,200 mg by mouth every 12 (twelve) hours.     nitroGLYCERIN (NITROSTAT) 0.4 MG SL tablet Place 1 tablet (  0.4 mg total) under the tongue every 5 (five) minutes as needed for chest pain. 25 tablet 3   nystatin (MYCOSTATIN) 100000 UNIT/ML suspension Take 5 mLs by mouth 3 (three) times daily as needed (thrush).     Olopatadine HCl (PATADAY OP) Place 1 drop into the left eye daily.     ondansetron (ZOFRAN) 4 MG tablet Take 1 tablet (4 mg total) by mouth every 8 (eight) hours as needed for nausea or vomiting. 20 tablet 0   OXYGEN Inhale 2 L/min into the lungs continuous.     PARoxetine (PAXIL) 40 MG tablet Take 1 tablet (40 mg total) by mouth every morning. 90 tablet 1   potassium chloride (KLOR-CON) 10 MEQ tablet Take 1 tablet (10 mEq total) by mouth as needed (take with furosemide (lasix)). 30 tablet 11   predniSONE (DELTASONE) 10 MG tablet Take 1 tablet (10 mg total) by mouth daily. (Patient taking differently: Take 10 mg by mouth daily. Take 2 tablets daily by mouth)  60 tablet 2   Spacer/Aero Chamber Mouthpiece MISC 1 Device by Does not apply route as directed. 1 each 0   SYMBICORT 160-4.5 MCG/ACT inhaler INHALE 2 PUFFS TWICE DAILY 30 g 3   Tiotropium Bromide Monohydrate (SPIRIVA RESPIMAT) 2.5 MCG/ACT AERS Inhale 2 puffs into the lungs daily. 4 g 5   HYDROcodone-acetaminophen (NORCO) 7.5-325 MG tablet Take 1 tablet by mouth every 6 (six) hours as needed for moderate pain. 30 tablet 0   HYDROcodone-acetaminophen (NORCO) 7.5-325 MG tablet Take 1 tablet by mouth every 6 (six) hours as needed for moderate pain. 30 tablet 0   No facility-administered medications prior to visit.     Review of Systems: as listed in HPI, otherwise negative    Physical Exam:  BP (!) 140/78 (BP Location: Right Arm, Cuff Size: Normal)   Pulse 80   Temp 98.1 F (36.7 C) (Temporal)   Ht 5' 1.5" (1.562 m)   Wt 140 lb 12.8 oz (63.9 kg)   SpO2 98%   BMI 26.17 kg/m   GEN: Pleasant, interactive, chronically-ill appearing; in no acute distress. HEENT:  Normocephalic and atraumatic. PERRLA. Sclera white. Nasal turbinates pink, moist and patent bilaterally. No rhinorrhea present. Oropharynx pink and moist, without exudate or edema. No lesions, ulcerations, or postnasal drip.  NECK:  Supple w/ fair ROM. No JVD present. Normal carotid impulses w/o bruits. Thyroid symmetrical with no goiter or nodules palpated. No lymphadenopathy.   CV: RRR, no m/r/g, no peripheral edema. Pulses intact, +2 bilaterally. No cyanosis, pallor or clubbing. PULMONARY:  Unlabored, regular breathing. Diminished bases with scattered wheezes bilaterally A&P. No stridor. No accessory muscle use.  GI: BS present and normoactive. Soft, non-tender to palpation. No organomegaly or masses detected.  MSK: No erythema, warmth or tenderness. Cap refil <2 sec all extrem. No deformities or joint swelling noted.  Neuro: A/Ox3. No focal deficits noted.   Skin: Warm, no lesions or urticaria. Erythema to both cheeks Psych:  Normal affect and behavior. Judgement and thought content appropriate.     Lab Results:  CBC    Component Value Date/Time   WBC 16.4 (H) 02/12/2022 1023   RBC 4.00 02/12/2022 1023   HGB 12.1 02/12/2022 1023   HGB 14.0 08/26/2016 1058   HCT 37.3 02/12/2022 1023   HCT 42.6 08/26/2016 1058   PLT 350.0 02/12/2022 1023   PLT 297 08/26/2016 1058   MCV 93.2 02/12/2022 1023   MCV 92 08/26/2016 1058   MCH 31.6 09/29/2021 1238  MCHC 32.4 02/12/2022 1023   RDW 15.0 02/12/2022 1023   RDW 14.1 08/26/2016 1058   LYMPHSABS 2.7 02/12/2022 1023   MONOABS 0.9 02/12/2022 1023   EOSABS 0.0 02/12/2022 1023   BASOSABS 0.1 02/12/2022 1023    BMET    Component Value Date/Time   NA 141 10/21/2021 1320   K 4.8 10/21/2021 1320   CL 102 10/21/2021 1320   CO2 22 10/21/2021 1320   GLUCOSE 112 (H) 10/21/2021 1320   GLUCOSE 105 (H) 10/04/2021 1436   BUN 18 10/21/2021 1320   CREATININE 0.92 10/21/2021 1320   CREATININE 1.16 (H) 07/19/2015 1447   CALCIUM 9.9 10/21/2021 1320   GFRNONAA >60 09/29/2021 1238   GFRAA 85 05/31/2020 0935    BNP    Component Value Date/Time   BNP 75.1 09/29/2021 1231     Imaging:  DG Chest 2 View  Result Date: 02/12/2022 CLINICAL DATA:  Asthma EXAM: CHEST - 2 VIEW COMPARISON:  09/29/21 FINDINGS: Pleural effusion. No pneumothorax. No focal airspace opacity. No displaced rib fracture. There are mildly prominent interstitial opacities, increased from prior exam. Normal cardiac and mediastinal contours. Visualized upper abdomen is unremarkable. IMPRESSION: Mildly prominent interstitial opacities, increased from prior exam. Findings are nonspecific, but can be seen in the setting of small airways disease/bronchitis Electronically Signed   By: Marin Roberts M.D.   On: 02/12/2022 10:51    methylPREDNISolone acetate (DEPO-MEDROL) injection 80 mg     Date Action Dose Route User   02/12/2022 1028 Given 80 mg Intramuscular (Right Upper Outer Quadrant) Mathis Bud, RN           Latest Ref Rng & Units 11/16/2014    1:05 PM  PFT Results  FVC-Pre L 2.00   FVC-Predicted Pre % 63   FVC-Post L 2.21   FVC-Predicted Post % 70   Pre FEV1/FVC % % 50   Post FEV1/FCV % % 48   FEV1-Pre L 1.00   FEV1-Predicted Pre % 41   FEV1-Post L 1.05   DLCO uncorrected ml/min/mmHg 11.63   DLCO [facility]% % 51   DLVA Predicted % 57   TLC L 5.06   TLC % Predicted % 103   RV % Predicted % 140     No results found for: "NITRICOXIDE"      Assessment & Plan:   Medication reaction Symptoms seem to correlate with timing of Dupixent injections and associate reaction. She showed me pictures today with significant facial edema. She is clinically improved today. We will stop Dupixent injections. Could consider [place]sp[place], which we briefly discussed today; would need to discuss with Dr. Elsworth Soho first. She is hesitant to try anything else right now so we will hold off and and monitor how she does from an asthma standpoint.  Patient Instructions  Continue Albuterol inhaler 2 puffs or duoneb 3 mL neb every 6 hours as needed for shortness of breath or wheezing. Notify if symptoms persist despite rescue inhaler/neb use. Use neb at least twice daily until symptoms improve Continue Symbicort 2 puffs Twice daily. Brush tongue and rinse mouth afterwards Continue Spiriva 2 puffs daily Continue cetirizine 1 tab daily  Continue mucinex 1200 mg Twice daily  Continue supplemental oxygen 2-3 lpm. Goal >88-90% Continue Trilogy ventilator every night   Prednisone taper. 4 tabs for 2 days, then 3 tabs for 2 days, 2 tabs for 2 days, then return to 1 tab daily. Start increased dose tomorrow Stop [place] today   Chest x ray today.  Follow up in 2 weeks with Katie Jawaun Celmer,NP. I'll send a message to have them get you on Dr. Bari Mantis schedule for 6-8 weeks at the new [place] location. If symptoms do not improve or worsen, please contact office for sooner follow up or seek emergency care.      Asthma-COPD overlap syndrome Unresolved exacerbation with bronchospasm on exam today. Non-toxic and VS stable w/o increased O2 demand. She did a neb treatment prior to coming into the office. We will treat her with depo inj 80 mg x 1 and another prednisone taper. She will return to 10 mg daily, once completed. CXR today to rule out superimposed infection. Continue triple therapy regimen. Encouraged her to use nebs at least twice a day until symptoms improve. Check CBC with diff to r/o peripheral eosinophilia r/t Dupixent. Strict return precautions.   Chronic respiratory failure with hypoxia and hypercapnia (HCC) Stable on baseline of 2 lpm at rest and 3 lpm with activity. Goal >88-90%. Compliant with NIV every night.    I spent 42 minutes of dedicated to the care of this patient on the date of this encounter to include pre-visit review of records, face-to-face time with the patient discussing conditions above, post visit ordering of testing, clinical documentation with the electronic health record, making appropriate referrals as documented, and communicating necessary findings to members of the patients care team.  Kim Bibles, NP 02/12/2022  Pt aware and understands NP's role.

## 2022-02-13 ENCOUNTER — Other Ambulatory Visit: Payer: Self-pay

## 2022-02-13 DIAGNOSIS — J40 Bronchitis, not specified as acute or chronic: Secondary | ICD-10-CM

## 2022-02-13 MED ORDER — DOXYCYCLINE HYCLATE 100 MG PO TABS: 100.0000 mg | ORAL_TABLET | Freq: Two times a day (BID) | ORAL | 0 refills | Status: DC

## 2022-02-17 ENCOUNTER — Telehealth: Payer: Self-pay | Admitting: Adult Health

## 2022-02-17 NOTE — Telephone Encounter (Signed)
Spoke with patient to schedule Medicare Annual Wellness Visit (AWV) either virtually or in office. Patient declined to schedule at this time and requested a call back April 2024   Last AWV12/19/22   please schedule with Nurse Health Adviser   45 min for awv-i and in office appointments 30 min for awv-s  phone/virtual appointments

## 2022-02-26 ENCOUNTER — Encounter: Payer: Self-pay | Admitting: Nurse Practitioner

## 2022-02-26 ENCOUNTER — Ambulatory Visit (INDEPENDENT_AMBULATORY_CARE_PROVIDER_SITE_OTHER): Payer: Medicare Other | Admitting: Nurse Practitioner

## 2022-02-26 VITALS — BP 124/62 | Ht 61.5 in | Wt 139.6 lb

## 2022-02-26 DIAGNOSIS — J4550 Severe persistent asthma, uncomplicated: Secondary | ICD-10-CM | POA: Diagnosis not present

## 2022-02-26 DIAGNOSIS — J4489 Other specified chronic obstructive pulmonary disease: Secondary | ICD-10-CM

## 2022-02-26 DIAGNOSIS — J9611 Chronic respiratory failure with hypoxia: Secondary | ICD-10-CM

## 2022-02-26 DIAGNOSIS — J9612 Chronic respiratory failure with hypercapnia: Secondary | ICD-10-CM

## 2022-02-26 MED ORDER — ALBUTEROL SULFATE HFA 108 (90 BASE) MCG/ACT IN AERS: INHALATION_SPRAY | RESPIRATORY_TRACT | 3 refills | Status: DC

## 2022-02-26 MED ORDER — BUDESONIDE-FORMOTEROL FUMARATE 160-4.5 MCG/ACT IN AERO: 2.0000 | INHALATION_SPRAY | Freq: Two times a day (BID) | RESPIRATORY_TRACT | 2 refills | Status: DC

## 2022-02-26 MED ORDER — SPIRIVA RESPIMAT 2.5 MCG/ACT IN AERS: 2.0000 | INHALATION_SPRAY | Freq: Every day | RESPIRATORY_TRACT | 5 refills | Status: DC

## 2022-02-26 NOTE — Progress Notes (Signed)
$'@Patient'G$  ID: Kim [name], female    DOB: 11/20/1955, 66 y.o.   MRN: [id number]  Chief Complaint  Patient presents with   Follow-up    Pt feels much better today    Referring provider: [name], NP  HPI: 66 year old female, former smoker followed for severe COPD/asthma and chronic respiratory failure.  She is a patient Dr. Bari Mantis and last seen in office 02/12/2022 by Belenda Cruise NP.  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   TEST/EVENTS:  11/16/2014 PFT: FVC 63, FEV1 41, ratio 48, TLC 103, DLCOunc 51 09/19/2021 CT chest: trace pericardial effusion. Atherosclerosis. Small hiatal hernia. No LAD. Severe centrilobular emphysema. Small b/l nodules.  09/20/2021 echo: EF 60-65%. RV size and function nl. AR mild to moderate. 02/12/2022 CXR 2 view: acute bronchitis with prominent interstitial opacities.   01/02/2022: OV with Dr. Elsworth Soho. Recurrent exacerbations with sudden explosive onset, suggestive of asthma in spite of maximal triple therapy and maintenance steroids. Hesitant about taking injection. After discussion, agreed to move forward with Dupixent injections. Continues on Symbicort/Spiriva and daily prednisone. Nocturnal NIV.   02/12/2022: OV with Sarvesh Meddaugh NP for acute visit. She started Dupixent injections on 9/28. A few days after her initial injection, she developed eye/facial swelling, feet swelling, and increased chest tightness. She then started coughing more and having more increased shortness of breath. Symptoms got better; she delayed her second injection until around 10/26, per her recolection. Unfortunately, symptoms returned and worsened after this. She also had increased cough, jaw pain, arthralgias, and diarrhea. She developed two new cold sores. She also felt like she was having trouble with food getting stuck when she was eating. She discussed symptoms with Dr. Elsworth Soho who advised she  increase her prednisone, which she has done over the past few days. She's down to 20 mg today with plans to go to 10 mg tomorrow, which is her daily dose. She does have some facial redness, which is normal for her with increased prednisone dosing. Other symptoms have mostly resolved but she's still having some increased chest tightness, occasional dry cough, and wheezing. She denies fevers, chills, hemoptysis. She does not want to continue on Dupixent. She is currently using her neb 4-6 times a day. Still on Symbicort and Spiriva. Takes cetirizine daily. CXR with acute bronchitis - treated with steroids and empiric doxycycline. Stopped dupixent. CBC with elevated WBC - likely d/t steroids.   02/26/2022: Today - follow up Patient presents today for follow-up after suspected medication reaction to Dupixent injections.  She had facial swelling, feet swelling and increased chest tightness.  She also developed acute bronchitis type symptoms.  She was instructed to stop Dupixent.  She was also treated with empiric doxycycline and prednisone taper.  She is feeling significantly better today.  Has not noticed any more wheezing and her cough has resolved.  She also feels like her chest is more open and she has not had as much shortness of breath.  Still gets winded with some activities which is her baseline.  Denies any fevers, chills, hemoptysis, PND.  She is using Symbicort and Spiriva for maintenance therapy.  No increased oxygen requirement.  Allergies  Allergen Reactions   Tape Other (See Comments)    Skin tears easily, peels skin off   Daliresp [Roflumilast]     Skin peeling   Dupixent [Dupilumab]     Cough, facial swelling, diarrhea, headache   Latex Other (See Comments)    Peels skin  off   Lisinopril Cough   Morphine And Related     hallucinations   Penicillins Hives and Other (See Comments)    Tolerates Omnicef Has patient had a PCN reaction causing immediate rash, facial/tongue/throat swelling,  SOB or lightheadedness with hypotension: Yes Has patient had a PCN reaction causing severe rash involving mucus membranes or skin necrosis: No Has patient had a PCN reaction that required hospitalization No Has patient had a PCN reaction occurring within the last 10 years: No If all of the above answers are "NO", then may proceed with Cephalosporin use.    Statins Other (See Comments)    Myalgia    Omnicef [Cefdinir] Rash    Immunization History  Administered Date(s) Administered   Influenza Split 01/31/2011, 01/27/2012, 03/25/2014   Influenza Whole 01/05/2008, 03/12/2009, 03/13/2010   Influenza,inj,Quad PF,6+ Mos 12/29/2012, 12/20/2014, 01/07/2017, 12/17/2017, 12/17/2018, 01/09/2020   Influenza-Unspecified 01/31/2011, 01/27/2012, 12/29/2012, 03/25/2014, 12/20/2014, 02/11/2016, 01/07/2017, 12/17/2017, 12/17/2018   PFIZER(Purple Top)SARS-COV-2 Vaccination 06/29/2019, 07/25/2019   Pneumococcal Conjugate-13 08/10/2015   Pneumococcal Polysaccharide-23 05/08/2006, 03/22/2020   Tdap 01/31/2011    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   OSTEOARTHRITIS 06/05/2009   PARESTHESIA 08/24/2007   TOBACCO ABUSE 01/25/2010    Tobacco History: Social History   Tobacco Use  Smoking Status Former   Packs/day: 2.00   Years: 38.00   Total pack years: 76.00   Types: Cigarettes   Quit date: 03/07/2014   Years since quitting: 7.9  Smokeless Tobacco Never   Counseling given: Not Answered   Outpatient Medications Prior to Visit  Medication Sig Dispense Refill   acetaminophen (TYLENOL) 500 MG tablet Take 500 mg by mouth 2 (two) times daily as needed for headache.     ALPRAZolam (XANAX) 0.5 MG tablet TAKE 1 TABLET(0.5 MG) BY MOUTH THREE TIMES DAILY AS NEEDED FOR ANXIETY 90 tablet 2   aspirin EC 81 MG EC tablet Take 1 tablet (81 mg total) by mouth  daily.     bisoprolol (ZEBETA) 5 MG tablet Take 0.5 tablets (2.5 mg total) by mouth daily as needed (Take HALF tablet by mouth for break through chest pain as needed). 45 tablet 3   butalbital-acetaminophen-caffeine (FIORICET) 50-325-40 MG tablet Take 1 tablet by mouth every 4 (four) hours as needed for headache. TAKE 1 TABLET BY MOUTH EVERY 4 HOURS AS NEEDED FOR HEADACHETAKE 1 TABLET BY MOUTH EVERY 4 HOURS AS NEEDED FOR HEADACHE 14 tablet 3   Calcium Carb-Cholecalciferol (CALCIUM 600 + D PO) Take 600 mg by mouth in the morning and at bedtime. Calcium 600 mg + 4000 unit D3     cetirizine (ZYRTEC) 10 MG tablet Take 10 mg by mouth daily.     Cholecalciferol (VITAMIN D3 PO) Take 5,000 Units by mouth daily.     ezetimibe (ZETIA) 10 MG tablet TAKE 1 TABLET(10 MG) BY MOUTH DAILY 90 tablet 1   furosemide (LASIX) 40 MG tablet Take 1 tablet (40 mg total) by mouth daily as needed (for an overnight weight gain of 3-5 pounds of fluid). 90 tablet 1   Ginkgo Biloba Extract 60 MG CAPS Take 1 capsule by mouth daily.     HYDROcodone-acetaminophen (NORCO) 7.5-325 MG tablet Take 1 tablet by mouth every 6 (six) hours as needed for moderate pain. 30 tablet 0   ipratropium-albuterol (DUONEB) 0.5-2.5 (3) MG/3ML SOLN USE 3 ML  VIA NEBULIZER EVERY 6 HOURS AS NEEDED.  DX: J44.9 360 mL 5   lidocaine (LIDODERM) 5 % Place 1 patch onto the skin daily. Remove & Discard patch within 12 hours or as directed by MD 30 patch 0   losartan (COZAAR) 25 MG tablet Take 1 tablet (25 mg total) by mouth daily. 90 tablet 3   magnesium oxide (MAG-OX) 400 MG tablet Take 400 mg by mouth daily.     MUCINEX MAXIMUM STRENGTH 1200 MG TB12 Take 1,200 mg by mouth every 12 (twelve) hours.     nitroGLYCERIN (NITROSTAT) 0.4 MG SL tablet Place 1 tablet (0.4 mg total) under the tongue every 5 (five) minutes as needed for chest pain. 25 tablet 3   nystatin (MYCOSTATIN) 100000 UNIT/ML suspension Take 5 mLs by mouth 3 (three) times daily as needed (thrush).      Olopatadine HCl (PATADAY OP) Place 1 drop into the left eye daily.     ondansetron (ZOFRAN) 4 MG tablet Take 1 tablet (4 mg total) by mouth every 8 (eight) hours as needed for nausea or vomiting. 20 tablet 0   OXYGEN Inhale 2 L/min into the lungs continuous.     PARoxetine (PAXIL) 40 MG tablet Take 1 tablet (40 mg total) by mouth every morning. 90 tablet 1   potassium chloride (KLOR-CON) 10 MEQ tablet Take 1 tablet (10 mEq total) by mouth as needed (take with furosemide (lasix)). 30 tablet 11   predniSONE (DELTASONE) 10 MG tablet Take 1 tablet (10 mg total) by mouth daily. (Patient taking differently: Take 10 mg by mouth daily. Take 2 tablets daily by mouth) 60 tablet 2   Spacer/Aero Chamber Mouthpiece MISC 1 Device by Does not apply route as directed. 1 each 0   albuterol (VENTOLIN HFA) 108 (90 Base) MCG/ACT inhaler INHALE 2 PUFFS INTO THE LUNGS EVERY 4 TO 6 HOURS AS NEEDED FOR SHORTNESS OF BREATH OR WHEEZING 18 g 3   SYMBICORT 160-4.5 MCG/ACT inhaler INHALE 2 PUFFS TWICE DAILY 30 g 3   Tiotropium Bromide Monohydrate (SPIRIVA RESPIMAT) 2.5 MCG/ACT AERS Inhale 2 puffs into the lungs daily. 4 g 5   HYDROcodone-acetaminophen (NORCO) 7.5-325 MG tablet Take 1 tablet by mouth every 6 (six) hours as needed for moderate pain. 30 tablet 0   HYDROcodone-acetaminophen (NORCO) 7.5-325 MG tablet Take 1 tablet by mouth every 6 (six) hours as needed for moderate pain. 30 tablet 0   doxycycline (VIBRA-TABS) 100 MG tablet Take 1 tablet (100 mg total) by mouth 2 (two) times daily. 14 tablet 0   Dupilumab (DUPIXENT) 300 MG/2ML SOPN Inject 300 mg into the skin every 14 (fourteen) days. (Patient not taking: Reported on 02/26/2022) 12 mL 1   No facility-administered medications prior to visit.     Review of Systems:  Constitutional: No weight loss or gain, night sweats, fevers, chills, fatigue, or lassitude. HEENT: No headaches, difficulty swallowing, tooth/dental problems, or sore throat. No sneezing, itching, ear  ache, nasal congestion, or post nasal drip CV:  No chest pain, orthopnea, PND, swelling in lower extremities, anasarca, dizziness, palpitations, syncope Resp: +shortness of breath with exertion (baseline). No excess mucus or change in color of mucus. No productive or non-productive. No hemoptysis. No wheezing.  No chest wall deformity GI:  No heartburn, indigestion, abdominal pain, nausea, vomiting, diarrhea, change in bowel habits, loss of appetite, bloody stools.  MSK:  No joint pain or swelling.   Neuro: No dizziness or lightheadedness.  Psych: No depression or anxiety. Mood stable.  Physical Exam:  BP 124/62 (BP Location: Left Arm, Patient Position: Sitting, Cuff Size: Normal)   Ht 5' 1.5" (1.562 m)   Wt 139 lb 9.6 oz (63.3 kg)   SpO2 95%   BMI 25.95 kg/m   GEN: Pleasant, interactive, well-kempt; in no acute distress. HEENT:  Normocephalic and atraumatic. PERRLA. Sclera white. Nasal turbinates pink, moist and patent bilaterally. No rhinorrhea present. Oropharynx pink and moist, without exudate or edema. No lesions, ulcerations, or postnasal drip.  NECK:  Supple w/ fair ROM. No JVD present. Normal carotid impulses w/o bruits. Thyroid symmetrical with no goiter or nodules palpated. No lymphadenopathy.   CV: RRR, no m/r/g, no peripheral edema. Pulses intact, +2 bilaterally. No cyanosis, pallor or clubbing. PULMONARY:  Unlabored, regular breathing. Clear bilaterally A&P w/o wheezes/rales/rhonchi. No accessory muscle use.  GI: BS present and normoactive. Soft, non-tender to palpation. No organomegaly or masses detected.  MSK: No erythema, warmth or tenderness. Cap refil <2 sec all extrem. No deformities or joint swelling noted.  Neuro: A/Ox3. No focal deficits noted.   Skin: Warm, no lesions or urticaria.  Psych: Normal affect and behavior. Judgement and thought content appropriate.     Lab Results:  CBC    Component Value Date/Time   WBC 16.4 (H) 02/12/2022 1023   RBC 4.00  02/12/2022 1023   HGB 12.1 02/12/2022 1023   HGB 14.0 08/26/2016 1058   HCT 37.3 02/12/2022 1023   HCT 42.6 08/26/2016 1058   PLT 350.0 02/12/2022 1023   PLT 297 08/26/2016 1058   MCV 93.2 02/12/2022 1023   MCV 92 08/26/2016 1058   MCH 31.6 09/29/2021 1238   MCHC 32.4 02/12/2022 1023   RDW 15.0 02/12/2022 1023   RDW 14.1 08/26/2016 1058   LYMPHSABS 2.7 02/12/2022 1023   MONOABS 0.9 02/12/2022 1023   EOSABS 0.0 02/12/2022 1023   BASOSABS 0.1 02/12/2022 1023    BMET    Component Value Date/Time   NA 141 10/21/2021 1320   K 4.8 10/21/2021 1320   CL 102 10/21/2021 1320   CO2 22 10/21/2021 1320   GLUCOSE 112 (H) 10/21/2021 1320   GLUCOSE 105 (H) 10/04/2021 1436   BUN 18 10/21/2021 1320   CREATININE 0.92 10/21/2021 1320   CREATININE 1.16 (H) 07/19/2015 1447   CALCIUM 9.9 10/21/2021 1320   GFRNONAA >60 09/29/2021 1238   GFRAA 85 05/31/2020 0935    BNP    Component Value Date/Time   BNP 75.1 09/29/2021 1231     Imaging:  DG Chest 2 View  Result Date: 02/12/2022 CLINICAL DATA:  Asthma EXAM: CHEST - 2 VIEW COMPARISON:  09/29/21 FINDINGS: Pleural effusion. No pneumothorax. No focal airspace opacity. No displaced rib fracture. There are mildly prominent interstitial opacities, increased from prior exam. Normal cardiac and mediastinal contours. Visualized upper abdomen is unremarkable. IMPRESSION: Mildly prominent interstitial opacities, increased from prior exam. Findings are nonspecific, but can be seen in the setting of small airways disease/bronchitis Electronically Signed   By: Marin Roberts M.D.   On: 02/12/2022 10:51    methylPREDNISolone acetate (DEPO-MEDROL) injection 80 mg     Date Action Dose Route User   02/12/2022 1028 Given 80 mg Intramuscular (Right Upper Outer Quadrant) Mathis Bud, RN          Latest Ref Rng & Units 11/16/2014    1:05 PM  PFT Results  FVC-Pre L 2.00   FVC-Predicted Pre % 63   FVC-Post L 2.21   FVC-Predicted Post % 70  Pre  FEV1/FVC % % 50   Post FEV1/FCV % % 48   FEV1-Pre L 1.00   FEV1-Predicted Pre % 41   FEV1-Post L 1.05   DLCO uncorrected ml/min/mmHg 11.63   DLCO [facility]% % 51   DLVA Predicted % 57   TLC L 5.06   TLC % Predicted % 103   RV % Predicted % 140     No results found for: "NITRICOXIDE"      Assessment & Plan:   Asthma-COPD overlap syndrome Severe asthma/COPD, recently with acute bronchitis.  She is clinically improved today.  Feels as though she is back to her baseline.  Continue triple therapy regimen as prescribed.  She will remain off biologic therapy for right now.  Does not wish to restart or try any alternative options.  Respect her decision.  Medications refilled today.  She did have a slight increase in her white blood cell count on previous CBC.  Suspect that this is related to higher doses of prednisone.  Plan to recheck in 4 to 6 weeks.  Patient Instructions  Continue Albuterol inhaler 2 puffs or duoneb 3 mL neb every 6 hours as needed for shortness of breath or wheezing. Notify if symptoms persist despite rescue inhaler/neb use. Use neb at least twice daily until symptoms improve Continue Symbicort 2 puffs Twice daily. Brush tongue and rinse mouth afterwards Continue Spiriva 2 puffs daily Continue cetirizine 1 tab daily  Continue mucinex 1200 mg Twice daily  Continue supplemental oxygen 2-3 lpm. Goal >88-90% Continue Trilogy ventilator every night     Follow up with Dr. Elsworth Soho as scheduled 1/9. If symptoms do not improve or worsen, please contact office for sooner follow up or seek emergency care.   Chronic respiratory failure with hypoxia and hypercapnia (HCC) Stable without increased O2 requirement.  Continue supplemental oxygen 2 L/min for goal greater than 80 is 90%.  Continue trilogy vent at night.    I spent 28 minutes of dedicated to the care of this patient on the date of this encounter to include pre-visit review of records, face-to-face time with the patient  discussing conditions above, post visit ordering of testing, clinical documentation with the electronic health record, making appropriate referrals as documented, and communicating necessary findings to members of the patients care team.  Clayton Bibles, NP 02/26/2022  Pt aware and understands NP's role.

## 2022-02-26 NOTE — Patient Instructions (Addendum)
Continue Albuterol inhaler 2 puffs or duoneb 3 mL neb every 6 hours as needed for shortness of breath or wheezing. Notify if symptoms persist despite rescue inhaler/neb use. Use neb at least twice daily until symptoms improve Continue Symbicort 2 puffs Twice daily. Brush tongue and rinse mouth afterwards Continue Spiriva 2 puffs daily Continue cetirizine 1 tab daily  Continue mucinex 1200 mg Twice daily  Continue supplemental oxygen 2-3 lpm. Goal >88-90% Continue Trilogy ventilator every night     Follow up with Dr. Elsworth Soho as scheduled 1/9. If symptoms do not improve or worsen, please contact office for sooner follow up or seek emergency care.

## 2022-02-26 NOTE — Assessment & Plan Note (Signed)
Stable without increased O2 requirement.  Continue supplemental oxygen 2 L/min for goal greater than 80 is 90%.  Continue trilogy vent at night.

## 2022-02-26 NOTE — Assessment & Plan Note (Addendum)
Severe asthma/COPD, recently with acute bronchitis.  She is clinically improved today.  Feels as though she is back to her baseline.  Continue triple therapy regimen as prescribed.  She will remain off biologic therapy for right now.  Does not wish to restart or try any alternative options.  Respect her decision.  Medications refilled today.  She did have a slight increase in her white blood cell count on previous CBC.  Suspect that this is related to higher doses of prednisone.  Plan to recheck in 4 to 6 weeks.  Patient Instructions  Continue Albuterol inhaler 2 puffs or duoneb 3 mL neb every 6 hours as needed for shortness of breath or wheezing. Notify if symptoms persist despite rescue inhaler/neb use. Use neb at least twice daily until symptoms improve Continue Symbicort 2 puffs Twice daily. Brush tongue and rinse mouth afterwards Continue Spiriva 2 puffs daily Continue cetirizine 1 tab daily  Continue mucinex 1200 mg Twice daily  Continue supplemental oxygen 2-3 lpm. Goal >88-90% Continue Trilogy ventilator every night     Follow up with Dr. Elsworth Soho as scheduled 1/9. If symptoms do not improve or worsen, please contact office for sooner follow up or seek emergency care.

## 2022-03-04 ENCOUNTER — Ambulatory Visit: Payer: Medicare Other | Admitting: Primary Care

## 2022-03-17 NOTE — Telephone Encounter (Signed)
Encounter opened in error, please disregard

## 2022-04-06 ENCOUNTER — Other Ambulatory Visit: Payer: Self-pay | Admitting: Adult Health

## 2022-04-06 DIAGNOSIS — F419 Anxiety disorder, unspecified: Secondary | ICD-10-CM

## 2022-04-14 ENCOUNTER — Other Ambulatory Visit: Payer: Self-pay | Admitting: Cardiovascular Disease

## 2022-04-15 ENCOUNTER — Encounter (HOSPITAL_BASED_OUTPATIENT_CLINIC_OR_DEPARTMENT_OTHER): Payer: Self-pay | Admitting: Pulmonary Disease

## 2022-04-15 ENCOUNTER — Ambulatory Visit (INDEPENDENT_AMBULATORY_CARE_PROVIDER_SITE_OTHER): Payer: Medicare Other | Admitting: Pulmonary Disease

## 2022-04-15 VITALS — BP 126/64 | HR 98 | Temp 98.6°F | Ht 61.5 in | Wt 139.0 lb

## 2022-04-15 DIAGNOSIS — J9612 Chronic respiratory failure with hypercapnia: Secondary | ICD-10-CM | POA: Diagnosis not present

## 2022-04-15 DIAGNOSIS — J9611 Chronic respiratory failure with hypoxia: Secondary | ICD-10-CM

## 2022-04-15 DIAGNOSIS — J4489 Other specified chronic obstructive pulmonary disease: Secondary | ICD-10-CM | POA: Diagnosis not present

## 2022-04-15 MED ORDER — PREDNISONE 5 MG PO TABS: 5.0000 mg | ORAL_TABLET | Freq: Every day | ORAL | 1 refills | Status: DC

## 2022-04-15 NOTE — Assessment & Plan Note (Signed)
Continue oxygen in the daytime. NIV during sleep with oxygen blended in

## 2022-04-15 NOTE — Progress Notes (Signed)
Subjective:    Patient ID: Kim [name], female    DOB: 11-Jun-1955, 67 y.o.   MRN: [id number]  HPI  67 yo ex smoker  for follow-up of severe COPD & chronic hypoxic resp failure PMH- Takatsubo cardiomyopathy 2015, 2017 and 2022, osteoporosis ,vertebral fractures Alpha-1 carrier?   Meds-stopped Daliresp due to skin peeling 01/2021     COPD exacerbations: Multiple since 2020, 2 hospitalizations in 2021 03/2020 hospitalized for 10 days for COPD exacerbation 09/2020 developed COVID infection, received monoclonal antibody 11/2020 adm for COPD exacerbation and stress-induced cardiomyopathy/acute systolic heart failure/type II myocardial infarction secondary to cardiomyopathy   02/2021 - treated for COPD flare, neighbor burning >> exposure to fumes  04/2021 video visit >> pred taper 06/2021 hosp visit for exac   08/2021 hosp for exac, [facility] 09/2021 exac after reclast infusion   11/2021 She has repeated exacerbations with sudden explosive onset suggestive of asthma,in spite of maximal triple therapy and maintenance steroids >> initiated [place] , [place] [address] 09/17/2021  Chief Complaint  Patient presents with   Follow-up    Pt states she stopped Dupixent since November 2023   She started Dupixent injections on 9/28. A few days after her initial injection, she developed eye/facial swelling, feet swelling, and increased chest tightness. She then started coughing more and having more increased shortness of breath. Symptoms got better; she delayed her second injection until around 10/26, per her recolection. Unfortunately, symptoms returned and worsened after this. She also had increased cough, jaw pain, arthralgias, and diarrhea. She developed two new cold sores. She also felt like she was having trouble with food getting stuck when she was eating.  She finally stopped taking Dupixent after 4 injections.  She shows me pictures today which show facial edema and erythema. She did okay over December, she  had 1 episode of increased dyspnea and she started taking 40 mg prednisone with a gradual taper.  Her baseline dose of prednisone is 10 mg. She is compliant with Symbicort and Spiriva -this was very expensive for her in November for 27-monthsupply cost of $400 She reports increased tremulousness She is compliant with her oxygen  Significant tests/ events reviewed  09/2020 ABG 7.3 7/46/82 01/2017 alpha-1 >> 102 nml level ,PI* M1N   11/2014: FVC 2.00 L (63%) FEV1 1.00 L (41%) ratio 50  negative bronchodilator response TLC 5.06 L (103%) RV 140% DLCO uncorrected 51%     08/2021>> CT chest: No suspicious or indeterminate focal pulmonary nodule seen. LDCT chest 05/2021 LLL nodule 4 mm, stable CT angiogram chest 09/2020 no airspace disease   CT angiogram chest 03/24/2020  Severe centrilobular emphysema with mild superimposed diffuse ground-glass opacity with an apical predominance, similar in comparison to prior study. Findings likely reflect smoking related lung disease/respiratory bronchiolitis. New mild compression fracture deformity of L1 since 10/ 2021 01/2020 LDCT - stable, T7 fracture 12/2018 LD CT chest - stable nodules   10/2016 CT chest showed subcentimeter nodules which have been stable since 2017, right adrenal nodule was stable since 2015 and considered benign    Review of Systems neg for any significant sore throat, dysphagia, itching, sneezing, nasal congestion or excess/ purulent secretions, fever, chills, sweats, unintended wt loss, pleuritic or exertional cp, hempoptysis, orthopnea pnd or change in chronic leg swelling. Also denies presyncope, palpitations, heartburn, abdominal pain, nausea, vomiting, diarrhea or change in bowel or urinary habits, dysuria,hematuria, rash, arthralgias, visual complaints, headache, numbness weakness or ataxia.     Objective:   Physical Exam  Gen. Pleasant, well-nourished, in no distress, his affect ENT - no thrush, no pallor/icterus,no post nasal  drip Neck: No JVD, no thyromegaly, no carotid bruits Lungs: no use of accessory muscles, no dullness to percussion, decreased without rales or rhonchi  Cardiovascular: Rhythm regular, heart sounds  normal, no murmurs or gallops, no peripheral edema Musculoskeletal: No deformities, no cyanosis or clubbing , tremors ++       Assessment & Plan:

## 2022-04-15 NOTE — Assessment & Plan Note (Addendum)
Continue Symbicort and Spiriva Use albuterol for rescue. Unfortunately she did not tolerate Dupixent  We discussed COPD action plan, she is steroid-dependent and will increase dose to 40 mg should she have an exacerbation.  We discussed signs and symptoms of exacerbation  If she has a stable.,  Would like to decrease her baseline prednisone dose to 10 mg alternating with 5 mg

## 2022-04-15 NOTE — Patient Instructions (Signed)
Alternate 10 mg with 5 mg prednisone on M/W/F  Continue symbicort & spiriva

## 2022-04-22 ENCOUNTER — Ambulatory Visit (INDEPENDENT_AMBULATORY_CARE_PROVIDER_SITE_OTHER): Payer: Medicare Other | Admitting: Adult Health

## 2022-04-22 VITALS — BP 138/80 | HR 100 | Temp 97.7°F | Ht 61.5 in | Wt 139.0 lb

## 2022-04-22 DIAGNOSIS — G8929 Other chronic pain: Secondary | ICD-10-CM | POA: Diagnosis not present

## 2022-04-22 DIAGNOSIS — E785 Hyperlipidemia, unspecified: Secondary | ICD-10-CM

## 2022-04-22 DIAGNOSIS — M159 Polyosteoarthritis, unspecified: Secondary | ICD-10-CM

## 2022-04-22 NOTE — Patient Instructions (Signed)
Health Maintenance Due  Topic Date Due   Hepatitis C Screening  Never done   Zoster Vaccines- Shingrix (1 of 2) Never done   MAMMOGRAM  08/08/2018   DTaP/Tdap/Td (2 - Td or Tdap) 01/30/2021   COVID-19 Vaccine (3 - 2023-24 season) 12/06/2021   Medicare Annual Wellness (AWV)  03/25/2022      Row Labels 09/10/2021    9:16 AM 03/25/2021    3:08 PM 03/22/2020    3:24 PM  Depression screen PHQ 2/9   Section Header. No data exists in this row.     Decreased Interest   1 1 0  Down, Depressed, Hopeless   1 1 0  PHQ - 2 Score   2 2 0  Altered sleeping   0 0 0  Tired, decreased energy   3 3 0  Change in appetite   0 0 0  Feeling bad or failure about yourself    1 1 0  Trouble concentrating   0 0 0  Moving slowly or fidgety/restless   0 0 0  Suicidal thoughts   0 0 0  PHQ-9 Score   6 6 0  Difficult doing work/chores    Not difficult at all Not difficult at all

## 2022-04-22 NOTE — Progress Notes (Signed)
Subjective:    Patient ID: Kim [name], female    DOB: [birth year], 67 y.o.   MRN: [id number]  HPI 67 year old female who  has a past medical history of C8 RADICULOPATHY (06/05/2009), COPD (08/31/2007), DEPRESSION (11/19/2006), DYSLIPIDEMIA (03/12/2009), MI (mitral incompetence), NEPHROLITHIASIS (01/05/2008), OSTEOARTHRITIS (06/05/2009), PARESTHESIA (08/24/2007), and TOBACCO ABUSE (01/25/2010).  She presents to the office today for chronic pain management visit   [place] reviewed in EPIC   Indication for chronic opioid: Osteoarthritis involving multiple joints  Medication and dose: Norco 7.5-325 mg Q6 H PRN  # pills per month: 45 Last UDS date: 01/29/2022 Opioid Treatment Agreement signed: 01/29/2022 Opioid Treatment Agreement last reviewed with patient: Today  [place] reviewed this encounter (include red flags):  Today   She would also like to check her lipid panel since being on Zetia 10 mg.   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   OSTEOARTHRITIS 06/05/2009   PARESTHESIA 08/24/2007   TOBACCO ABUSE 01/25/2010    Social History   Socioeconomic History   Marital status: Married    Spouse name: [name]   Number of children: 2   Years of education: Not on file   Highest education level: Not on file  Occupational History   Occupation: Patient care aide-sits with elderly lady  Tobacco Use   Smoking status: Former    Packs/day: 2.00    Years: 38.00    Total pack years: 76.00    Types: Cigarettes    Quit date: 03/07/2014    Years since quitting: 8.1   Smokeless tobacco: Never  Vaping Use   Vaping Use: Never used  Substance and Sexual Activity   Alcohol use: No    Alcohol/week: 0.0 standard drinks of alcohol   Drug use: No   Sexual activity: Not on file  Other Topics Concern   Not on file   Social History Narrative   [facility] (01/12/17):   Originally from [facility]. Has lived in [place] for 17 years. Married. Has 2 dogs currently. No mold and only 1 indoor plant. Had her home professionally cleaned a month ago. No bird or hot tub exposure. 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    Right handed    Social Determinants of Health   Financial Resource Strain: High Risk (09/10/2021)   Overall Financial Resource Strain (CARDIA)    Difficulty of Paying Living Expenses: Very hard  Food Insecurity: No Food Insecurity (09/10/2021)   Hunger Vital Sign    Worried About Running Out of Food in the Last Year: Never true    Ran Out of Food in the Last Year: Never true  Transportation Needs: No Transportation Needs (09/10/2021)   PRAPARE - Hydrologist (Medical): No    Lack of Transportation (Non-Medical): No  Physical Activity: Inactive (03/25/2021)   Exercise Vital Sign    Days of Exercise per Week: 0 days    Minutes of Exercise per Session: 0 min  Stress: No Stress Concern Present (09/10/2021)   [place]    Feeling of Stress : Not at all  Social Connections: Moderately Isolated (03/25/2021)   Social Connection and Isolation Panel [NHANES]    Frequency of Communication with Friends and Family: More  than three times a week    Frequency of Social Gatherings with Friends and Family: More than three times a week    Attends Religious Services: Never    Marine scientist or Organizations: No    Attends Archivist Meetings: Never    Marital Status: Married  Human resources officer Violence: Not At Risk (03/25/2021)   Humiliation, Afraid, Rape, and Kick questionnaire    Fear of Current or Ex-Partner: No    Emotionally Abused: No    Physically Abused: No    Sexually Abused: No    Past Surgical History:  Procedure Laterality Date   [place] N/A 07/05/2015   Procedure: Left Heart Cath and Coronary Angiography;  Surgeon: Leonie Man, MD;  Location: [facility];  Service: Cardiovascular;  Laterality: N/A;   CHOLECYSTECTOMY  1989   collapse lung  1990   COLONOSCOPY WITH PROPOFOL N/A 05/14/2018   Procedure: COLONOSCOPY WITH PROPOFOL;  Surgeon: Irene Shipper, MD;  Location: [facility];  Service: Endoscopy;  Laterality: N/A;   EXCISION MASS ABDOMINAL  2019   EXPLORATORY LAPAROTOMY  1969   LEFT HEART CATH AND CORONARY ANGIOGRAPHY N/A 11/19/2020   Procedure: LEFT HEART CATH AND CORONARY ANGIOGRAPHY;  Surgeon: [name], MD;  Location: [facility];  Service: Cardiovascular;  Laterality: N/A;   POLYPECTOMY  05/14/2018   Procedure: POLYPECTOMY;  Surgeon: Irene Shipper, MD;  Location: [facility];  Service: Endoscopy;;   SALPINGOOPHORECTOMY  2019    Family History  Problem Relation Age of Onset   Cancer Mother        lung   Heart attack Mother    Hypertension Sister    Multiple sclerosis Brother    Diabetes Sister    Rheum arthritis Sister    Thyroid disease Sister    Multiple sclerosis Other    Alcohol abuse Other    Arthritis Other    Diabetes Other    Kidney disease Other    Cancer Other        lung,ovarian,skin, uterine   Stroke Other    Heart disease Other    Melanoma Other    Osteoporosis Other    Heart attack Maternal Uncle    Heart attack Other        NEICE   Heart attack Other        NEPHEW   Hypertension Brother    Stroke Maternal Grandmother    Colon cancer Neg Hx     Allergies  Allergen Reactions   Tape Other (See Comments)    Skin tears easily, peels skin off   Daliresp [Roflumilast]     Skin peeling   Dupixent [Dupilumab]     Cough, facial swelling, diarrhea, headache   Latex Other (See Comments)    Peels skin off   Lisinopril Cough   Morphine And Related     hallucinations   Penicillins Hives and Other (See Comments)     Tolerates Omnicef Has patient had a PCN reaction causing immediate rash, facial/tongue/throat swelling, SOB or lightheadedness with hypotension: Yes Has patient had a PCN reaction causing severe rash involving mucus membranes or skin necrosis: No Has patient had a PCN reaction that required hospitalization No Has patient had a PCN reaction occurring within the last 10 years: No If all of the above answers are "NO", then may proceed with Cephalosporin use.    Statins Other (See Comments)  Myalgia    Omnicef [Cefdinir] Rash    Current Outpatient Medications on File Prior to Visit  Medication Sig Dispense Refill   acetaminophen (TYLENOL) 500 MG tablet Take 500 mg by mouth 2 (two) times daily as needed for headache.     albuterol (VENTOLIN HFA) 108 (90 Base) MCG/ACT inhaler INHALE 2 PUFFS INTO THE LUNGS EVERY 4 TO 6 HOURS AS NEEDED FOR SHORTNESS OF BREATH OR WHEEZING 18 g 3   ALPRAZolam (XANAX) 0.5 MG tablet TAKE 1 TABLET(0.5 MG) BY MOUTH THREE TIMES DAILY AS NEEDED FOR ANXIETY 90 tablet 2   aspirin EC 81 MG EC tablet Take 1 tablet (81 mg total) by mouth daily.     bisoprolol (ZEBETA) 5 MG tablet Take 0.5 tablets (2.5 mg total) by mouth daily as needed (Take HALF tablet by mouth for break through chest pain as needed). 45 tablet 3   budesonide-formoterol (SYMBICORT) 160-4.5 MCG/ACT inhaler Inhale 2 puffs into the lungs 2 (two) times daily. 30 g 2   butalbital-acetaminophen-caffeine (FIORICET) 50-325-40 MG tablet Take 1 tablet by mouth every 4 (four) hours as needed for headache. TAKE 1 TABLET BY MOUTH EVERY 4 HOURS AS NEEDED FOR HEADACHETAKE 1 TABLET BY MOUTH EVERY 4 HOURS AS NEEDED FOR HEADACHE 14 tablet 3   Calcium Carb-Cholecalciferol (CALCIUM 600 + D PO) Take 600 mg by mouth in the morning and at bedtime. Calcium 600 mg + 4000 unit D3     cetirizine (ZYRTEC) 10 MG tablet Take 10 mg by mouth daily.     Cholecalciferol (VITAMIN D3 PO) Take 5,000 Units by mouth daily.     ezetimibe (ZETIA) 10  MG tablet TAKE 1 TABLET(10 MG) BY MOUTH DAILY 90 tablet 1   furosemide (LASIX) 40 MG tablet Take 1 tablet (40 mg total) by mouth daily. 90 tablet 3   Ginkgo Biloba Extract 60 MG CAPS Take 1 capsule by mouth daily.     HYDROcodone-acetaminophen (NORCO) 7.5-325 MG tablet Take 1 tablet by mouth every 6 (six) hours as needed for moderate pain. 30 tablet 0   HYDROcodone-acetaminophen (NORCO) 7.5-325 MG tablet Take 1 tablet by mouth every 6 (six) hours as needed for moderate pain. 30 tablet 0   HYDROcodone-acetaminophen (NORCO) 7.5-325 MG tablet Take 1 tablet by mouth every 6 (six) hours as needed for moderate pain. 30 tablet 0   ipratropium-albuterol (DUONEB) 0.5-2.5 (3) MG/3ML SOLN USE 3 ML VIA NEBULIZER EVERY 6 HOURS AS NEEDED.  DX: J44.9 360 mL 5   lidocaine (LIDODERM) 5 % Place 1 patch onto the skin daily. Remove & Discard patch within 12 hours or as directed by MD 30 patch 0   losartan (COZAAR) 25 MG tablet Take 1 tablet (25 mg total) by mouth daily. 90 tablet 3   magnesium oxide (MAG-OX) 400 MG tablet Take 400 mg by mouth daily.     MUCINEX MAXIMUM STRENGTH 1200 MG TB12 Take 1,200 mg by mouth every 12 (twelve) hours.     nitroGLYCERIN (NITROSTAT) 0.4 MG SL tablet Place 1 tablet (0.4 mg total) under the tongue every 5 (five) minutes as needed for chest pain. 25 tablet 3   nystatin (MYCOSTATIN) 100000 UNIT/ML suspension Take 5 mLs by mouth 3 (three) times daily as needed (thrush).     Olopatadine HCl (PATADAY OP) Place 1 drop into the left eye daily.     ondansetron (ZOFRAN) 4 MG tablet Take 1 tablet (4 mg total) by mouth every 8 (eight) hours as needed for nausea or vomiting. 20 tablet  0   OXYGEN Inhale 2 L/min into the lungs continuous.     PARoxetine (PAXIL) 40 MG tablet TAKE 1 TABLET(40 MG) BY MOUTH EVERY MORNING 90 tablet 1   potassium chloride (KLOR-CON) 10 MEQ tablet Take 1 tablet (10 mEq total) by mouth as needed (take with furosemide (lasix)). 30 tablet 11   predniSONE (DELTASONE) 10 MG  tablet Take 1 tablet (10 mg total) by mouth daily. (Patient taking differently: Take 10 mg by mouth daily. Take 2 tablets daily by mouth) 60 tablet 2   predniSONE (DELTASONE) 5 MG tablet Take 1 tablet (5 mg total) by mouth daily with breakfast. 60 tablet 1   Spacer/Aero Chamber Mouthpiece MISC 1 Device by Does not apply route as directed. 1 each 0   spironolactone (ALDACTONE) 25 MG tablet Take by mouth.     Tiotropium Bromide Monohydrate (SPIRIVA RESPIMAT) 2.5 MCG/ACT AERS Inhale 2 puffs into the lungs daily. 4 g 5   No current facility-administered medications on file prior to visit.    BP 138/80   Pulse 100   Temp 97.7 F (36.5 C) (Oral)   Ht 5' 1.5" (1.562 m)   Wt 139 lb (63 kg)   SpO2 93% Comment: level 3 o2  BMI 25.84 kg/m       Objective:   Physical Exam Vitals and nursing note reviewed.  Constitutional:      Appearance: Normal appearance.  Cardiovascular:     Rate and Rhythm: Normal rate and regular rhythm.     Pulses: Normal pulses.     Heart sounds: Normal heart sounds.  Pulmonary:     Effort: Pulmonary effort is normal.     Breath sounds: Normal breath sounds.  Skin:    General: Skin is warm and dry.  Neurological:     General: No focal deficit present.     Mental Status: She is alert and oriented to person, place, and time.  Psychiatric:        Mood and Affect: Mood normal.        Behavior: Behavior normal.        Thought Content: Thought content normal.        Judgment: Judgment normal.       Assessment & Plan:   1. Encounter for chronic pain management  - DRUG MONITOR, PANEL 1, W/CONF, URINE; Future - Follow up in three months  2. Osteoarthritis of multiple joints, unspecified osteoarthritis type  - DRUG MONITOR, PANEL 1, W/CONF, URINE; Future  3. Hyperlipidemia, unspecified hyperlipidemia type  - Lipid panel; Future - Comprehensive metabolic panel; Future  [name], NP

## 2022-04-24 ENCOUNTER — Other Ambulatory Visit: Payer: Medicare Other

## 2022-04-25 ENCOUNTER — Other Ambulatory Visit: Payer: Self-pay | Admitting: Adult Health

## 2022-04-25 DIAGNOSIS — M159 Polyosteoarthritis, unspecified: Secondary | ICD-10-CM

## 2022-04-25 DIAGNOSIS — G8929 Other chronic pain: Secondary | ICD-10-CM

## 2022-04-25 LAB — DRUG MONITOR, PANEL 1, W/CONF, URINE
Alphahydroxyalprazolam: 569 ng/mL — ABNORMAL HIGH (ref <25)
Alphahydroxymidazolam: NEGATIVE ng/mL (ref <50)
Alphahydroxytriazolam: NEGATIVE ng/mL (ref <50)
Aminoclonazepam: NEGATIVE ng/mL (ref <25)
Amphetamines: NEGATIVE ng/mL (ref <500)
Barbiturates: NEGATIVE ng/mL (ref <300)
Benzodiazepines: POSITIVE ng/mL — AB (ref <100)
Cocaine Metabolite: NEGATIVE ng/mL (ref <150)
Codeine: NEGATIVE ng/mL (ref <50)
Creatinine: 191.1 mg/dL (ref >=20.0)
Hydrocodone: 1310 ng/mL — ABNORMAL HIGH (ref <50)
Hydromorphone: 81 ng/mL — ABNORMAL HIGH (ref <50)
Hydroxyethylflurazepam: NEGATIVE ng/mL (ref <50)
Lorazepam: NEGATIVE ng/mL (ref <50)
Marijuana Metabolite: NEGATIVE ng/mL (ref <20)
Methadone Metabolite: NEGATIVE ng/mL (ref <100)
Morphine: NEGATIVE ng/mL (ref <50)
Nordiazepam: NEGATIVE ng/mL (ref <50)
Norhydrocodone: 1716 ng/mL — ABNORMAL HIGH (ref <50)
Opiates: POSITIVE ng/mL — AB (ref <100)
Oxazepam: NEGATIVE ng/mL (ref <50)
Oxidant: NEGATIVE ug/mL (ref <200)
Oxycodone: NEGATIVE ng/mL (ref <100)
Phencyclidine: NEGATIVE ng/mL (ref <25)
Temazepam: NEGATIVE ng/mL (ref <50)
pH: 5.4 (ref 4.5–9.0)

## 2022-04-25 LAB — DM TEMPLATE

## 2022-04-25 MED ORDER — HYDROCODONE-ACETAMINOPHEN 7.5-325 MG PO TABS: 1.0000 | ORAL_TABLET | Freq: Four times a day (QID) | ORAL | 0 refills | Status: DC | PRN

## 2022-04-27 ENCOUNTER — Other Ambulatory Visit: Payer: Self-pay | Admitting: Nurse Practitioner

## 2022-04-29 ENCOUNTER — Other Ambulatory Visit (INDEPENDENT_AMBULATORY_CARE_PROVIDER_SITE_OTHER): Payer: Medicare Other

## 2022-04-29 DIAGNOSIS — E785 Hyperlipidemia, unspecified: Secondary | ICD-10-CM

## 2022-04-29 LAB — LIPID PANEL
Cholesterol: 282 mg/dL — ABNORMAL HIGH (ref 0–200)
HDL: 72.2 mg/dL (ref >39.00)
NonHDL: 209.4
Total CHOL/HDL Ratio: 4
Triglycerides: 324 mg/dL — ABNORMAL HIGH (ref 0.0–149.0)
VLDL: 64.8 mg/dL — ABNORMAL HIGH (ref 0.0–40.0)

## 2022-04-29 LAB — COMPREHENSIVE METABOLIC PANEL
ALT: 16 U/L (ref 0–35)
AST: 17 U/L (ref 0–37)
Albumin: 4.3 g/dL (ref 3.5–5.2)
Alkaline Phosphatase: 44 U/L (ref 39–117)
BUN: 29 mg/dL — ABNORMAL HIGH (ref 6–23)
CO2: 35 mEq/L — ABNORMAL HIGH (ref 19–32)
Calcium: 9.5 mg/dL (ref 8.4–10.5)
Chloride: 98 mEq/L (ref 96–112)
Creatinine, Ser: 1.19 mg/dL (ref 0.40–1.20)
GFR: 47.07 mL/min — ABNORMAL LOW (ref >60.00)
Glucose, Bld: 91 mg/dL (ref 70–99)
Potassium: 3.7 mEq/L (ref 3.5–5.1)
Sodium: 142 mEq/L (ref 135–145)
Total Bilirubin: 0.3 mg/dL (ref 0.2–1.2)
Total Protein: 6.9 g/dL (ref 6.0–8.3)

## 2022-04-29 LAB — LDL CHOLESTEROL, DIRECT: Direct LDL: 175 mg/dL

## 2022-04-30 ENCOUNTER — Telehealth: Payer: Self-pay | Admitting: Adult Health

## 2022-04-30 DIAGNOSIS — E785 Hyperlipidemia, unspecified: Secondary | ICD-10-CM

## 2022-04-30 DIAGNOSIS — I2510 Atherosclerotic heart disease of native coronary artery without angina pectoris: Secondary | ICD-10-CM

## 2022-04-30 NOTE — Telephone Encounter (Signed)
Updated patient on her labs, cholesterol panel was worse. She is taking Zetia. Is intolerate to multiple statins.   We discussed repatha but is scared of injectables as she has had some bad experiences with them.   She is open to going to the lipid clinic

## 2022-05-08 ENCOUNTER — Ambulatory Visit
Admission: RE | Admit: 2022-05-08 | Discharge: 2022-05-08 | Disposition: A | Discharge status: Home / Self Care | Payer: Medicare Other | Source: Ambulatory Visit | Attending: Acute Care | Admitting: Acute Care

## 2022-05-08 DIAGNOSIS — Z87891 Personal history of nicotine dependence: Secondary | ICD-10-CM

## 2022-05-09 ENCOUNTER — Other Ambulatory Visit: Payer: Self-pay | Admitting: Acute Care

## 2022-05-09 DIAGNOSIS — Z87891 Personal history of nicotine dependence: Secondary | ICD-10-CM

## 2022-05-09 DIAGNOSIS — Z122 Encounter for screening for malignant neoplasm of respiratory organs: Secondary | ICD-10-CM

## 2022-05-15 ENCOUNTER — Other Ambulatory Visit: Payer: Self-pay | Admitting: Pulmonary Disease

## 2022-05-15 DIAGNOSIS — J9611 Chronic respiratory failure with hypoxia: Secondary | ICD-10-CM

## 2022-05-15 DIAGNOSIS — J449 Chronic obstructive pulmonary disease, unspecified: Secondary | ICD-10-CM

## 2022-05-19 ENCOUNTER — Other Ambulatory Visit: Payer: Self-pay | Admitting: Adult Health

## 2022-05-19 DIAGNOSIS — F419 Anxiety disorder, unspecified: Secondary | ICD-10-CM

## 2022-05-20 NOTE — Telephone Encounter (Signed)
Okay for refill?  

## 2022-05-23 ENCOUNTER — Telehealth: Payer: Self-pay | Admitting: Pulmonary Disease

## 2022-05-23 NOTE — Telephone Encounter (Signed)
Spoke with patient she states Spiriva and Symbicort are too expensive she states they will cost about $500 a piece for a months supply. Is it okay to give her samples of both for now. I will have our prior auth team run a ticket on her insurance to see what's preferred

## 2022-05-26 ENCOUNTER — Other Ambulatory Visit (HOSPITAL_COMMUNITY): Payer: Self-pay

## 2022-05-26 NOTE — Telephone Encounter (Signed)
Patient has a deductible to meed which is contributing to the high co-pays. Memory Dance is preferred by plan over Symbicort but still a high co-pay due to deductible.   Both Trelegy and Judithann Sauger are covered at this time again with a high co-pay due to deductible not being met.

## 2022-05-29 NOTE — Telephone Encounter (Signed)
Called and spoke with the pt and notified of response per pharm team  Dr Elsworth Soho, did you want to give her samples of either trelegy or breztri? These we have available at the office

## 2022-06-02 MED ORDER — FLUTICASONE FUROATE-VILANTEROL 200-25 MCG/ACT IN AEPB: 1.0000 | INHALATION_SPRAY | Freq: Every day | RESPIRATORY_TRACT | 2 refills | Status: DC

## 2022-06-02 NOTE — Telephone Encounter (Signed)
Breo sent in. Nothing further needed

## 2022-06-02 NOTE — Telephone Encounter (Signed)
Called and spoke to patient and she states that she tried Librarian, academic in the past but it did not help her at all. She also states that she called and found out that the Trelegy would cost her almost $600. She states she has never tried the [place] before  Please advise sir

## 2022-06-02 NOTE — Addendum Note (Signed)
Addended by: Monna Fam L on: 06/02/2022 11:55 AM   Modules accepted: Orders

## 2022-06-05 ENCOUNTER — Ambulatory Visit: Payer: Medicare Other | Attending: Internal Medicine | Admitting: Pharmacist

## 2022-06-05 DIAGNOSIS — I5042 Chronic combined systolic (congestive) and diastolic (congestive) heart failure: Secondary | ICD-10-CM | POA: Insufficient documentation

## 2022-06-05 DIAGNOSIS — T466X5A Adverse effect of antihyperlipidemic and antiarteriosclerotic drugs, initial encounter: Secondary | ICD-10-CM | POA: Diagnosis present

## 2022-06-05 DIAGNOSIS — M791 Myalgia, unspecified site: Secondary | ICD-10-CM | POA: Diagnosis present

## 2022-06-05 DIAGNOSIS — E785 Hyperlipidemia, unspecified: Secondary | ICD-10-CM | POA: Diagnosis present

## 2022-06-05 DIAGNOSIS — I2510 Atherosclerotic heart disease of native coronary artery without angina pectoris: Secondary | ICD-10-CM

## 2022-06-05 DIAGNOSIS — E782 Mixed hyperlipidemia: Secondary | ICD-10-CM | POA: Insufficient documentation

## 2022-06-05 MED ORDER — ROSUVASTATIN CALCIUM 5 MG PO TABS: 5.0000 mg | ORAL_TABLET | Freq: Every day | ORAL | 3 refills | Status: DC

## 2022-06-05 MED ORDER — POTASSIUM CHLORIDE ER 10 MEQ PO TBCR: 10.0000 meq | EXTENDED_RELEASE_TABLET | ORAL | 11 refills | Status: DC | PRN

## 2022-06-05 MED ORDER — LOSARTAN POTASSIUM 25 MG PO TABS: 25.0000 mg | ORAL_TABLET | Freq: Every day | ORAL | 3 refills | Status: DC

## 2022-06-05 NOTE — Assessment & Plan Note (Signed)
Assessment: LDL-C is above goal of less than 70 Patient rightfully is very hesitant to do any sort of injection due to negative reactions in the past She is open to rechallenge with statins We discussed making sure that she limits fried foods and focusing on a diet high in lean proteins such as chicken fish and [place], nuts seeds low-fat [place] yogurt and plenty of fiber from fruits and vegetables  Plan: Start rosuvastatin 5 mg 3 times a week increase to daily if tolerating.  Continue ezetimibe 10 mg daily Recheck lipids April 5

## 2022-06-05 NOTE — Assessment & Plan Note (Signed)
Patient has stopped her losartan per [facility] providers inking it was the cause of her orthostasis.  Patient does not think this had anything to do with it.  With her history of reduced ejection fraction will resume losartan at 12.5 mg daily.  Will check CMP at appointment in April

## 2022-06-05 NOTE — Progress Notes (Signed)
Patient ID: Kim [name]                 DOB: 23-Sep-1955                    MRN: [id number]      HPI: MEKO MORTIER is a 67 y.o. female patient referred to lipid clinic by Dr. Angelena Form. PMH is significant for CAD, NICM, COPD, HLD, tobacco abuse. Cardiac cath March 2017 with minimal CAD, severe LV systolic dysfunction ([id number]).  Has previously been intolerant to statins in the past.  Currently on ezetimibe 10 mg daily.  LDL-C 149.  PCP asked [place] to see patient in lipid clinic.  Patient presents today accompanied by her husband.  She is extremely hesitant to injections due to multiple occasions where she had injections or infusions and ended up in the hospital.  She is on chronic steroids due to her COPD emphysema/asthma.  She is unable to wean completely off of them.  She takes 5 mg 3 days a week and 10 mg of prednisone the other days.  She is upset about her cholesterol.  She has been working hard on her diet trying to cut back on fried foods.  We had a very long discussion about diet effect on cholesterol, however aggressive LDL goal due to risk factors and potential genetic role in her cholesterol.  Also discussed the effects that her prednisone on most likely playing on her cholesterol.  Unfortunately we can get her off of this so we will have to work around it.  Patient does not remember much about her statin intolerance.  Really only remembers taking a medication that made her body ache. She had stopped taking her losartan because she was having episodes of orthostatic hypotension after standing up.  However this has not helped at all.  She has not had a basic workup to figure out why this happens and nothing has come of it.  Patient states that she has not learned how to deal with it.  Patient's inhalers are a large cost to her.  Costing over thousand dollars a month for 2 inhalers.  I did encourage her to apply for low income subsidy to see if she qualifies for any extra help.  I also  provided her with the S HI IP number for [facility].  Current Medications: ezetimibe '10mg'$  daily Intolerances: pravastatin '20mg'$ , pravastatin '40mg'$ , rosuvastatin '20mg'$ , simvastatin '40mg'$   Risk Factors: CAD, HLD LDL-C goal: <70 ApoB goal: <80 Non-HDL-C goal: <100  Diet: Tries to cut back on fried foods  Exercise: Not discussed  Family History:  Family History  Problem Relation Age of Onset   Cancer Mother        lung   Heart attack Mother    Hypertension Sister    Multiple sclerosis Brother    Diabetes Sister    Rheum arthritis Sister    Thyroid disease Sister    Multiple sclerosis Other    Alcohol abuse Other    Arthritis Other    Diabetes Other    Kidney disease Other    Cancer Other        lung,ovarian,skin, uterine   Stroke Other    Heart disease Other    Melanoma Other    Osteoporosis Other    Heart attack Maternal Uncle    Heart attack Other        NEICE   Heart attack Other        NEPHEW  Hypertension Brother    Stroke Maternal Grandmother    Colon cancer Neg Hx       Social History:  Social History   Socioeconomic History   Marital status: Married    Spouse name: [name]   Number of children: 2   Years of education: Not on file   Highest education level: Not on file  Occupational History   Occupation: Patient care aide-sits with elderly lady  Tobacco Use   Smoking status: Former    Packs/day: 2.00    Years: 38.00    Total pack years: 76.00    Types: Cigarettes    Quit date: 03/07/2014    Years since quitting: 8.2   Smokeless tobacco: Never  Vaping Use   Vaping Use: Never used  Substance and Sexual Activity   Alcohol use: No    Alcohol/week: 0.0 standard drinks of alcohol   Drug use: No   Sexual activity: Not on file  Other Topics Concern   Not on file  Social History Narrative   [facility] (01/12/17):   Originally from [facility]. Has lived in [place] for 17 years. Married. Has 2 dogs currently. No mold and only 1 indoor plant. Had her home  professionally cleaned a month ago. No bird or hot tub exposure. 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    Right handed    Social Determinants of Health   Financial Resource Strain: High Risk (09/10/2021)   Overall Financial Resource Strain (CARDIA)    Difficulty of Paying Living Expenses: Very hard  Food Insecurity: No Food Insecurity (09/10/2021)   Hunger Vital Sign    Worried About Running Out of Food in the Last Year: Never true    Ran Out of Food in the Last Year: Never true  Transportation Needs: No Transportation Needs (09/10/2021)   PRAPARE - Hydrologist (Medical): No    Lack of Transportation (Non-Medical): No  Physical Activity: Inactive (03/25/2021)   Exercise Vital Sign    Days of Exercise per Week: 0 days    Minutes of Exercise per Session: 0 min  Stress: No Stress Concern Present (09/10/2021)   [place]    Feeling of Stress : Not at all  Social Connections: Moderately Isolated (03/25/2021)   Social Connection and Isolation Panel [NHANES]    Frequency of Communication with Friends and Family: More than three times a week    Frequency of Social Gatherings with Friends and Family: More than three times a week    Attends Religious Services: Never    Marine scientist or Organizations: No    Attends Archivist Meetings: Never    Marital Status: Married  Human resources officer Violence: Not At Risk (03/25/2021)   Humiliation, Afraid, Rape, and Kick questionnaire    Fear of Current or Ex-Partner: No    Emotionally Abused: No    Physically Abused: No    Sexually Abused: No     Labs: Lipid Panel     Component Value Date/Time   CHOL 282 (H) 04/29/2022 1109   CHOL 221 (H) 01/25/2018 0811   TRIG 324.0 (H) 04/29/2022 1109   HDL 72.20 04/29/2022 1109   HDL 41 01/25/2018 0811   CHOLHDL 4 04/29/2022 1109   VLDL 64.8 (H) 04/29/2022 1109    LDLCALC 149 (H) 10/15/2021 0952   LDLCALC 151 (H) 01/25/2018 0811   LDLDIRECT 175.0 04/29/2022 1109  LABVLDL 29 01/25/2018 0811    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   OSTEOARTHRITIS 06/05/2009   PARESTHESIA 08/24/2007   TOBACCO ABUSE 01/25/2010    Current Outpatient Medications on File Prior to Visit  Medication Sig Dispense Refill   acetaminophen (TYLENOL) 500 MG tablet Take 500 mg by mouth 2 (two) times daily as needed for headache.     albuterol (VENTOLIN HFA) 108 (90 Base) MCG/ACT inhaler INHALE 2 PUFFS INTO THE LUNGS EVERY 4 TO 6 HOURS AS NEEDED FOR SHORTNESS OF BREATH OR WHEEZING 18 g 3   ALPRAZolam (XANAX) 0.5 MG tablet TAKE 1 TABLET(0.5 MG) BY MOUTH THREE TIMES DAILY AS NEEDED FOR ANXIETY 90 tablet 2   aspirin EC 81 MG EC tablet Take 1 tablet (81 mg total) by mouth daily.     bisoprolol (ZEBETA) 5 MG tablet Take 0.5 tablets (2.5 mg total) by mouth daily as needed (Take HALF tablet by mouth for break through chest pain as needed). 45 tablet 3   budesonide-formoterol (SYMBICORT) 160-4.5 MCG/ACT inhaler Inhale 2 puffs into the lungs 2 (two) times daily. 30 g 2   butalbital-acetaminophen-caffeine (FIORICET) 50-325-40 MG tablet Take 1 tablet by mouth every 4 (four) hours as needed for headache. TAKE 1 TABLET BY MOUTH EVERY 4 HOURS AS NEEDED FOR HEADACHETAKE 1 TABLET BY MOUTH EVERY 4 HOURS AS NEEDED FOR HEADACHE 14 tablet 3   Calcium Carb-Cholecalciferol (CALCIUM 600 + D PO) Take 600 mg by mouth in the morning and at bedtime. Calcium 600 mg + 4000 unit D3     cetirizine (ZYRTEC) 10 MG tablet Take 10 mg by mouth daily.     Cholecalciferol (VITAMIN D3 PO) Take 5,000 Units by mouth daily.     ezetimibe (ZETIA) 10 MG tablet TAKE 1 TABLET(10 MG) BY MOUTH DAILY 90 tablet 1   furosemide (LASIX) 40 MG tablet Take 1 tablet (40 mg total) by  mouth daily. 90 tablet 3   Ginkgo Biloba Extract 60 MG CAPS Take 1 capsule by mouth daily.     HYDROcodone-acetaminophen (NORCO) 7.5-325 MG tablet Take 1 tablet by mouth every 6 (six) hours as needed for moderate pain. 30 tablet 0   HYDROcodone-acetaminophen (NORCO) 7.5-325 MG tablet Take 1 tablet by mouth every 6 (six) hours as needed for moderate pain. 30 tablet 0   HYDROcodone-acetaminophen (NORCO) 7.5-325 MG tablet Take 1 tablet by mouth every 6 (six) hours as needed for moderate pain. 30 tablet 0   ipratropium-albuterol (DUONEB) 0.5-2.5 (3) MG/3ML SOLN USE 3 ML VIA NEBULIZER EVERY 6 HOURS AS NEEDED 360 mL 5   lidocaine (LIDODERM) 5 % Place 1 patch onto the skin daily. Remove & Discard patch within 12 hours or as directed by MD 30 patch 0   magnesium oxide (MAG-OX) 400 MG tablet Take 400 mg by mouth daily.     MUCINEX MAXIMUM STRENGTH 1200 MG TB12 Take 1,200 mg by mouth every 12 (twelve) hours.     nitroGLYCERIN (NITROSTAT) 0.4 MG SL tablet Place 1 tablet (0.4 mg total) under the tongue every 5 (five) minutes as needed for chest pain. 25 tablet 3   nystatin (MYCOSTATIN) 100000 UNIT/ML suspension Take 5 mLs by mouth 3 (three) times daily as needed (thrush).     Olopatadine HCl (PATADAY OP) Place 1 drop into the left eye daily.     ondansetron (  ZOFRAN) 4 MG tablet Take 1 tablet (4 mg total) by mouth every 8 (eight) hours as needed for nausea or vomiting. 20 tablet 0   OXYGEN Inhale 2 L/min into the lungs continuous.     PARoxetine (PAXIL) 40 MG tablet TAKE 1 TABLET(40 MG) BY MOUTH EVERY MORNING 90 tablet 1   predniSONE (DELTASONE) 10 MG tablet TAKE 1 TABLET(10 MG) BY MOUTH DAILY 60 tablet 2   predniSONE (DELTASONE) 5 MG tablet Take 1 tablet (5 mg total) by mouth daily with breakfast. 60 tablet 1   Spacer/Aero Chamber Mouthpiece MISC 1 Device by Does not apply route as directed. 1 each 0   spironolactone (ALDACTONE) 25 MG tablet Take by mouth.     Tiotropium Bromide Monohydrate (SPIRIVA RESPIMAT)  2.5 MCG/ACT AERS Inhale 2 puffs into the lungs daily. 4 g 5   No current facility-administered medications on file prior to visit.    Allergies  Allergen Reactions   Tape Other (See Comments)    Skin tears easily, peels skin off   Daliresp [Roflumilast]     Skin peeling   Dupixent [Dupilumab]     Cough, facial swelling, diarrhea, headache   Latex Other (See Comments)    Peels skin off   Lisinopril Cough   Morphine And Related     hallucinations   Penicillins Hives and Other (See Comments)    Tolerates Omnicef Has patient had a PCN reaction causing immediate rash, facial/tongue/throat swelling, SOB or lightheadedness with hypotension: Yes Has patient had a PCN reaction causing severe rash involving mucus membranes or skin necrosis: No Has patient had a PCN reaction that required hospitalization No Has patient had a PCN reaction occurring within the last 10 years: No If all of the above answers are "NO", then may proceed with Cephalosporin use.    Statins Other (See Comments)    Myalgia    Omnicef [Cefdinir] Rash    Assessment/Plan:  1. Hyperlipidemia -  Chronic combined systolic (congestive) and diastolic (congestive) heart failure ([place]) Patient has stopped her losartan per [facility] providers inking it was the cause of her orthostasis.  Patient does not think this had anything to do with it.  With her history of reduced ejection fraction will resume losartan at 12.5 mg daily.  Will check CMP at appointment in April  Dyslipidemia Assessment: LDL-C is above goal of less than 70 Patient rightfully is very hesitant to do any sort of injection due to negative reactions in the past She is open to rechallenge with statins We discussed making sure that she limits fried foods and focusing on a diet high in lean proteins such as chicken fish and [place], nuts seeds low-fat [place] yogurt and plenty of fiber from fruits and vegetables  Plan: Start rosuvastatin 5 mg 3 times a week increase  to daily if tolerating.  Continue ezetimibe 10 mg daily Recheck lipids April 5    Thank you,  Ramond Dial, [place].D, BCPS, CPP [facility] [address], [place], [place] [address]  Phone: [phone]; Fax: [phone]

## 2022-06-05 NOTE — Patient Instructions (Addendum)
Start taking rosuvastatin '5mg'$  three times a week. Try to increase to daily if tolerating Continue ezetimibe '10mg'$  daily  Resume losartan 12.'5mg'$  daily  Please call me with any issues [phone]

## 2022-07-11 ENCOUNTER — Ambulatory Visit: Payer: Medicare Other | Attending: Cardiovascular Disease

## 2022-07-11 DIAGNOSIS — E782 Mixed hyperlipidemia: Secondary | ICD-10-CM

## 2022-07-12 LAB — COMPREHENSIVE METABOLIC PANEL
ALT: 28 IU/L (ref 0–32)
AST: 23 IU/L (ref 0–40)
Albumin/Globulin Ratio: 2 (ref 1.2–2.2)
Albumin: 4.5 g/dL (ref 3.9–4.9)
Alkaline Phosphatase: 68 IU/L (ref 44–121)
BUN/Creatinine Ratio: 20 (ref 12–28)
BUN: 24 mg/dL (ref 8–27)
Bilirubin Total: 0.3 mg/dL (ref 0.0–1.2)
CO2: 31 mmol/L — ABNORMAL HIGH (ref 20–29)
Calcium: 9.8 mg/dL (ref 8.7–10.3)
Chloride: 97 mmol/L (ref 96–106)
Creatinine, Ser: 1.19 mg/dL — ABNORMAL HIGH (ref 0.57–1.00)
Globulin, Total: 2.2 g/dL (ref 1.5–4.5)
Glucose: 112 mg/dL — ABNORMAL HIGH (ref 70–99)
Potassium: 3.8 mmol/L (ref 3.5–5.2)
Sodium: 145 mmol/L — ABNORMAL HIGH (ref 134–144)
Total Protein: 6.7 g/dL (ref 6.0–8.5)
eGFR: 50 mL/min/{1.73_m2} — ABNORMAL LOW (ref >59)

## 2022-07-12 LAB — LIPID PANEL
Chol/HDL Ratio: 5.6 ratio — ABNORMAL HIGH (ref 0.0–4.4)
Cholesterol, Total: 310 mg/dL — ABNORMAL HIGH (ref 100–199)
HDL: 55 mg/dL (ref >39)
LDL Chol Calc (NIH): 168 mg/dL — ABNORMAL HIGH (ref 0–99)
Triglycerides: 447 mg/dL — ABNORMAL HIGH (ref 0–149)
VLDL Cholesterol Cal: 87 mg/dL — ABNORMAL HIGH (ref 5–40)

## 2022-07-14 ENCOUNTER — Encounter: Payer: Self-pay | Admitting: Cardiovascular Disease

## 2022-07-14 ENCOUNTER — Ambulatory Visit: Payer: Medicare Other | Attending: Cardiovascular Disease | Admitting: Cardiovascular Disease

## 2022-07-14 ENCOUNTER — Telehealth: Payer: Self-pay | Admitting: Pharmacist

## 2022-07-14 VITALS — BP 132/78 | HR 92 | Ht 61.5 in | Wt 141.2 lb

## 2022-07-14 DIAGNOSIS — I2510 Atherosclerotic heart disease of native coronary artery without angina pectoris: Secondary | ICD-10-CM | POA: Diagnosis present

## 2022-07-14 DIAGNOSIS — R7309 Other abnormal glucose: Secondary | ICD-10-CM | POA: Insufficient documentation

## 2022-07-14 DIAGNOSIS — I472 Ventricular tachycardia, unspecified: Secondary | ICD-10-CM | POA: Diagnosis present

## 2022-07-14 DIAGNOSIS — I5032 Chronic diastolic (congestive) heart failure: Secondary | ICD-10-CM | POA: Insufficient documentation

## 2022-07-14 DIAGNOSIS — R55 Syncope and collapse: Secondary | ICD-10-CM | POA: Diagnosis present

## 2022-07-14 DIAGNOSIS — E785 Hyperlipidemia, unspecified: Secondary | ICD-10-CM | POA: Insufficient documentation

## 2022-07-14 DIAGNOSIS — R739 Hyperglycemia, unspecified: Secondary | ICD-10-CM

## 2022-07-14 MED ORDER — ROSUVASTATIN CALCIUM 20 MG PO TABS: 20.0000 mg | ORAL_TABLET | Freq: Every day | ORAL | 3 refills | Status: DC

## 2022-07-14 NOTE — Patient Instructions (Signed)
Medication Instructions:  Your physician has recommended you make the following change in your medication:  1.) stop spironolactone (Aldactone)  *If you need a refill on your cardiac medications before your next appointment, please call your pharmacy*   Lab Work: Today: HgA1c  If you have labs (blood work) drawn today and your tests are completely normal, you will receive your results only by: MyChart Message (if you have MyChart) OR A paper copy in the mail If you have any lab test that is abnormal or we need to change your treatment, we will call you to review the results.   Testing/Procedures: Your physician has recommended that you wear an event monitor. Event monitors are medical devices that record the heart's electrical activity. Doctors most often [place] these monitors to diagnose arrhythmias. Arrhythmias are problems with the speed or rhythm of the heartbeat. The monitor is a small, portable device. You can wear one while you do your normal daily activities. This is usually used to diagnose what is causing palpitations/syncope (passing out).   Follow-Up: At [facility], you and your health needs are our priority.  As part of our continuing mission to provide you with exceptional heart care, we have created designated Provider Care Teams.  These Care Teams include your primary Cardiologist (physician) and Advanced Practice Providers (APPs -  Physician Assistants and Nurse Practitioners) who all work together to provide you with the care you need, when you need it.   Your next appointment:   3 month(s)  Provider:   Verne Carrow, MD    or Advanced Practice Practitioner (PA-C or NP)

## 2022-07-14 NOTE — Telephone Encounter (Addendum)
Called patient to review her labs.  Triglycerides significantly elevated above 400.  LDL-C 168, which is probably not accurate due to elevated triglycerides.  A direct LDL in January was 75.  Very little improvement.  Patient states she has been taking rosuvastatin 5 mg daily.  Does not notice any difference.  Still taking ezetimibe 10 mg daily.  Patient does not want to do injections.  She is on chronic prednisone 15 mg a day.  I am concerned about her blood sugar.  We reviewed saturated fats and carbs sugar intake.  She drinks water, diet green tea less than 1 soda per week and a cappuccino that has 5 g of sugar in it daily.  She does eat chips as a snack and a cup and a half of grapes every night.  I have asked her to decrease the quantity of grapes and cut back on chips.  We will increase rosuvastatin to 20 mg daily and will recheck lipid panel, direct LDL-C, A1c and LFTs in 6 weeks. Patient is seeing Dr. Clifton James today. I have advised her to keep this apt. We did talk about medications for TG. But I think vascepa could be cost prohibitive and fenofibrate doesn't have CV data. Will give lifestyle some chance and revisit.

## 2022-07-14 NOTE — Progress Notes (Signed)
Chief Complaint  Patient presents with   Follow-up    CAD     History of Present Illness: 67 yo female with history of CAD, NICM, severe COPD, HLD and tobacco abuse here today for cardiac follow up. I saw her as a new patient in April 2016. She was hospitalized in [place] December 2015 with COPD exacerbation. Troponin was mildly elevated at 2. Cardiac catheterization in 2015 showed no obstructive CAD. She was felt to have a COPD exacerbation and she improved with treatment of her COPD. She quit smoking in September 2015. Admitted to [facility] March 2017 with respiratory distress, CHF. Cardiac cath March 2017 with minimal CAD, severe LV systolic dysfunction (LVEF=20-25%). 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  She was diuresed with clinical improvement. She was seen several times in our office after discharge with dizziness and hypotension. Lisinopril and Lasix were decreased. She is followed in the pulmonary office by Dr. Vassie Loll. Event monitor April 2017 with sinus, PACs, PVCs. Echo April 2021 with normal LV systolic function and mild AI. She was admitted to [facility] April 4-7, 2021 with a COPD exacerbation. Echo August 2022 with LVEF=30-35%. Cardiac cath August 2022 with mild LAD plaque. Echo September 2022 with LVEF=55-60%. Syncopal episode in December 2022 with negative neurological workup. She was orthostatic. She was admitted to [facility] May 2023 following a syncopal event. She was found to have a COPD exacerbation and subdural hematoma. Echo May 2023 with LVEF=35-40% with wall motion abnormalities possibly consistent with atypical stress induced cardiomyopathy. Mild AI. Repeat echo June 2023 with LVEF=60-65%. Mild to moderate AI. Carotid artery dopplers June 2023 with mild bilateral carotid disease. Cardiac monitor June 2023 with 43 second run of VT. She was seen in the EP clinic by Dr. Ladona Ridgel in July 2023 and he  recommended watchful waiting and continuation of her beta blocker. I saw her in October 2023 and she had c/o dizziness at times but no near syncope or syncope. We changed her aldactone to Losartan but she continued to have dizziness. She stopped Losartan and has been taking aldactone 12.5 mg daily.    She is here today for follow up. The patient denies any chest pain, dyspnea, palpitations, lower extremity edema, orthopnea, PND. Still having some dizziness. This occurs when standing. Two syncopal events last month. Once when walking inside from the porch and once when lifting her arms over her head in the kitchen. She has since stopped Losartan and had no further syncope.    Primary Care Physician: Shirline Frees, NP  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   OSTEOARTHRITIS 06/05/2009   PARESTHESIA 08/24/2007   TOBACCO ABUSE 01/25/2010    Past Surgical History:  Procedure Laterality Date   ABDOMINAL HYSTERECTOMY     APPENDECTOMY  1969   CARDIAC CATHETERIZATION N/A 07/05/2015   Procedure: Left Heart Cath and Coronary Angiography;  Surgeon: Marykay Lex, MD;  Location: [facility] INVASIVE CV LAB;  Service: Cardiovascular;  Laterality: N/A;   CHOLECYSTECTOMY  1989   collapse lung  1990   COLONOSCOPY WITH PROPOFOL N/A 05/14/2018   Procedure: COLONOSCOPY WITH PROPOFOL;  Surgeon: Hilarie Fredrickson, MD;  Location: [facility] ENDOSCOPY;  Service: Endoscopy;  Laterality: N/A;   EXCISION MASS ABDOMINAL  2019   EXPLORATORY LAPAROTOMY  1969  LEFT HEART CATH AND CORONARY ANGIOGRAPHY N/A 11/19/2020   Procedure: LEFT HEART CATH AND CORONARY ANGIOGRAPHY;  Surgeon: Yvonne KendallEnd, Renny Remer, MD;  Location: [facility] INVASIVE CV LAB;  Service: Cardiovascular;  Laterality: N/A;   POLYPECTOMY  05/14/2018   Procedure: POLYPECTOMY;  Surgeon: Hilarie FredricksonPerry, John N, MD;  Location: [facility] ENDOSCOPY;  Service:  Endoscopy;;   SALPINGOOPHORECTOMY  2019    Current Outpatient Medications  Medication Sig Dispense Refill   acetaminophen (TYLENOL) 500 MG tablet Take 500 mg by mouth 2 (two) times daily as needed for headache.     albuterol (VENTOLIN HFA) 108 (90 Base) MCG/ACT inhaler INHALE 2 PUFFS INTO THE LUNGS EVERY 4 TO 6 HOURS AS NEEDED FOR SHORTNESS OF BREATH OR WHEEZING 18 g 3   ALPRAZolam (XANAX) 0.5 MG tablet TAKE 1 TABLET(0.5 MG) BY MOUTH THREE TIMES DAILY AS NEEDED FOR ANXIETY 90 tablet 2   aspirin EC 81 MG EC tablet Take 1 tablet (81 mg total) by mouth daily.     bisoprolol (ZEBETA) 5 MG tablet Take 0.5 tablets (2.5 mg total) by mouth daily as needed (Take HALF tablet by mouth for break through chest pain as needed). 45 tablet 3   budesonide-formoterol (SYMBICORT) 160-4.5 MCG/ACT inhaler Inhale 2 puffs into the lungs 2 (two) times daily. 30 g 2   butalbital-acetaminophen-caffeine (FIORICET) 50-325-40 MG tablet Take 1 tablet by mouth every 4 (four) hours as needed for headache. TAKE 1 TABLET BY MOUTH EVERY 4 HOURS AS NEEDED FOR HEADACHETAKE 1 TABLET BY MOUTH EVERY 4 HOURS AS NEEDED FOR HEADACHE 14 tablet 3   cetirizine (ZYRTEC) 10 MG tablet Take 10 mg by mouth daily.     Cholecalciferol (VITAMIN D3 PO) Take 5,000 Units by mouth daily.     ezetimibe (ZETIA) 10 MG tablet TAKE 1 TABLET(10 MG) BY MOUTH DAILY 90 tablet 1   furosemide (LASIX) 40 MG tablet Take 1 tablet (40 mg total) by mouth daily. 90 tablet 3   HYDROcodone-acetaminophen (NORCO) 7.5-325 MG tablet Take 1 tablet by mouth every 6 (six) hours as needed for moderate pain. 30 tablet 0   HYDROcodone-acetaminophen (NORCO) 7.5-325 MG tablet Take 1 tablet by mouth every 6 (six) hours as needed for moderate pain. 30 tablet 0   HYDROcodone-acetaminophen (NORCO) 7.5-325 MG tablet Take 1 tablet by mouth every 6 (six) hours as needed for moderate pain. 30 tablet 0   ipratropium-albuterol (DUONEB) 0.5-2.5 (3) MG/3ML SOLN USE 3 ML VIA NEBULIZER EVERY 6  HOURS AS NEEDED 360 mL 5   magnesium oxide (MAG-OX) 400 MG tablet Take 400 mg by mouth daily.     MUCINEX MAXIMUM STRENGTH 1200 MG TB12 Take 1,200 mg by mouth every 12 (twelve) hours.     nitroGLYCERIN (NITROSTAT) 0.4 MG SL tablet Place 1 tablet (0.4 mg total) under the tongue every 5 (five) minutes as needed for chest pain. 25 tablet 3   ondansetron (ZOFRAN) 4 MG tablet Take 1 tablet (4 mg total) by mouth every 8 (eight) hours as needed for nausea or vomiting. 20 tablet 0   OXYGEN Inhale 2 L/min into the lungs continuous.     PARoxetine (PAXIL) 40 MG tablet TAKE 1 TABLET(40 MG) BY MOUTH EVERY MORNING 90 tablet 1   potassium chloride (KLOR-CON) 10 MEQ tablet Take 1 tablet (10 mEq total) by mouth as needed (take with furosemide (lasix)). 30 tablet 11   predniSONE (DELTASONE) 10 MG tablet TAKE 1 TABLET(10 MG) BY MOUTH DAILY 60 tablet 2   predniSONE (DELTASONE) 5 MG tablet Take 1  tablet (5 mg total) by mouth daily with breakfast. 60 tablet 1   rosuvastatin (CRESTOR) 20 MG tablet Take 1 tablet (20 mg total) by mouth daily. 90 tablet 3   Spacer/Aero Chamber Mouthpiece MISC 1 Device by Does not apply route as directed. 1 each 0   No current facility-administered medications for this visit.    Allergies  Allergen Reactions   Tape Other (See Comments)    Skin tears easily, peels skin off   Daliresp [Roflumilast]     Skin peeling   Dupixent [Dupilumab]     Cough, facial swelling, diarrhea, headache   Latex Other (See Comments)    Peels skin off   Lisinopril Cough   Morphine And Related     hallucinations   Penicillins Hives and Other (See Comments)    Tolerates Omnicef Has patient had a PCN reaction causing immediate rash, facial/tongue/throat swelling, SOB or lightheadedness with hypotension: Yes Has patient had a PCN reaction causing severe rash involving mucus membranes or skin necrosis: No Has patient had a PCN reaction that required hospitalization No Has patient had a PCN reaction  occurring within the last 10 years: No If all of the above answers are "NO", then may proceed with Cephalosporin use.    Statins Other (See Comments)    Myalgia    Omnicef [Cefdinir] Rash    Social History   Socioeconomic History   Marital status: Married    Spouse name: Thereasa Distance   Number of children: 2   Years of education: Not on file   Highest education level: Not on file  Occupational History   Occupation: Patient care aide-sits with elderly lady  Tobacco Use   Smoking status: Former    Packs/day: 2.00    Years: 38.00    Additional pack years: 0.00    Total pack years: 76.00    Types: Cigarettes    Quit date: 03/07/2014    Years since quitting: 8.3   Smokeless tobacco: Never  Vaping Use   Vaping Use: Never used  Substance and Sexual Activity   Alcohol use: No    Alcohol/week: 0.0 standard drinks of alcohol   Drug use: No   Sexual activity: Not on file  Other Topics Concern   Not on file  Social History Narrative   [facility] (01/12/17):   Originally from [facility]. Has lived in [place] for 17 years. Married. Has 2 dogs currently. No mold and only 1 indoor plant. Had her home professionally cleaned a month ago. No bird or hot tub exposure. 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    Right handed    Social Determinants of Health   Financial Resource Strain: High Risk (09/10/2021)   Overall Financial Resource Strain (CARDIA)    Difficulty of Paying Living Expenses: Very hard  Food Insecurity: No Food Insecurity (09/10/2021)   Hunger Vital Sign    Worried About Running Out of Food in the Last Year: Never true    Ran Out of Food in the Last Year: Never true  Transportation Needs: No Transportation Needs (09/10/2021)   PRAPARE - Administrator, Civil Service (Medical): No    Lack of Transportation (Non-Medical): No  Physical Activity: Inactive (03/25/2021)   Exercise Vital Sign    Days of Exercise per Week: 0 days    Minutes of  Exercise per Session: 0 min  Stress: No Stress Concern Present (09/10/2021)   [employer] of Occupational Health - Occupational Stress  Questionnaire    Feeling of Stress : Not at all  Social Connections: Moderately Isolated (03/25/2021)   Social Connection and Isolation Panel [NHANES]    Frequency of Communication with Friends and Family: More than three times a week    Frequency of Social Gatherings with Friends and Family: More than three times a week    Attends Religious Services: Never    Database administrator or Organizations: No    Attends Banker Meetings: Never    Marital Status: Married  Catering manager Violence: Not At Risk (03/25/2021)   Humiliation, Afraid, Rape, and Kick questionnaire    Fear of Current or Ex-Partner: No    Emotionally Abused: No    Physically Abused: No    Sexually Abused: No    Family History  Problem Relation Age of Onset   Cancer Mother        lung   Heart attack Mother    Hypertension Sister    Multiple sclerosis Brother    Diabetes Sister    Rheum arthritis Sister    Thyroid disease Sister    Multiple sclerosis Other    Alcohol abuse Other    Arthritis Other    Diabetes Other    Kidney disease Other    Cancer Other        lung,ovarian,skin, uterine   Stroke Other    Heart disease Other    Melanoma Other    Osteoporosis Other    Heart attack Maternal Uncle    Heart attack Other        NEICE   Heart attack Other        NEPHEW   Hypertension Brother    Stroke Maternal Grandmother    Colon cancer Neg Hx     Review of Systems:  As stated in the HPI and otherwise negative.   BP 132/78   Pulse 92   Ht 5' 1.5" (1.562 m)   Wt 64 kg   SpO2 97%   BMI 26.25 kg/m   Physical Examination: General: Well developed, well nourished, NAD  HEENT: OP clear, mucus membranes moist  SKIN: warm, dry. No rashes. Neuro: No focal deficits  Musculoskeletal: Muscle strength 5/5 all ext  Psychiatric: Mood and affect normal   Neck: No JVD, no carotid bruits, no thyromegaly, no lymphadenopathy.  Lungs:Clear bilaterally, no wheezes, rhonci, crackles Cardiovascular: Regular rate and rhythm. No murmurs, gallops or rubs. Abdomen:Soft. Bowel sounds present. Non-tender.  Extremities: No lower extremity edema. Pulses are 2 + in the bilateral DP/PT.  Echo June 2023:  1. Left ventricular ejection fraction, by estimation, is 60 to 65%. The  left ventricle has normal function. The left ventricle has no regional  wall motion abnormalities. Left ventricular diastolic parameters were  normal.   2. Right ventricular systolic function is normal. The right ventricular  size is normal.   3. The mitral valve is normal in structure. No evidence of mitral valve  regurgitation. No evidence of mitral stenosis.   4. The aortic valve was not well visualized. There is mild calcification  of the aortic valve. Aortic valve regurgitation is mild to moderate.  Aortic valve sclerosis is present, with no evidence of aortic valve  stenosis.   5. The inferior vena cava is normal in size with greater than 50%  respiratory variability, suggesting right atrial pressure of 3 mmHg.   EKG:  EKG is not ordered today. The ekg ordered today demonstrates   Recent Labs: 08/26/2021: TSH 0.373 08/28/2021:  Magnesium 2.1 09/29/2021: B Natriuretic Peptide 75.1 10/04/2021: Pro B Natriuretic peptide (BNP) 76.0 02/12/2022: Hemoglobin 12.1; Platelets 350.0 07/11/2022: ALT 28; BUN 24; Creatinine, Ser 1.19; Potassium 3.8; Sodium 145   Lipid Panel    Component Value Date/Time   CHOL 310 (H) 07/11/2022 1115   TRIG 447 (H) 07/11/2022 1115   HDL 55 07/11/2022 1115   CHOLHDL 5.6 (H) 07/11/2022 1115   CHOLHDL 4 04/29/2022 1109   VLDL 64.8 (H) 04/29/2022 1109   LDLCALC 168 (H) 07/11/2022 1115   LDLDIRECT 175.0 04/29/2022 1109     Wt Readings from Last 3 Encounters:  07/14/22 64 kg  04/22/22 63 kg  04/15/22 63 kg    Assessment and Plan:   1. CAD without  angina: Minimal CAD by cath in 2022. He has no chest pain suggestive of angina. Will continue ASA and beta blocker.    2. Hyperlipidemia: Lipids have been followed in primary care. She stopped her statin due to muscle aches. She was seen in the lipid clinic on 06/05/22 and Crestor 5 mg was started three days per week and Zetia was continued. No real change in her LDL (175 down to 168). TG are over 400. Per PharmD, will increase Crestor to 20 mg daily. Will check HgbA1C today given concern for possible DM with chronic steroid use.   3.  Stress induced cardiomyopathy/Chronic diastolic CHF: LV function normal by echo June 2023. No evidence of volume overload on exam. Weight is stable. Continue Lasix and beta blocker.      4. VT: This occurred while sleeping. Per EP recs, continue beta blocker. See below.   5. COPD: closely followed by pulmonary.  6. Dizziness: She continues to have dizziness daily and had two syncopal events last month. VT on monitor in 2023 but she was sleeping when this occurred. She was seen in the EP clinic at that time. I will arrange a 30 day live event monitor to exclude arrhythmias. Stop aldactone.   Labs/ tests ordered today include:   Orders Placed This Encounter  Procedures   HgB A1c   Cardiac event monitor   Disposition:   F/U with me in 12 months  Signed, Verne Carrow, MD 07/14/2022 3:53 PM    [facility] [address] [place], [place], [place]  [address] Phone: [phone]; Fax: [phone]

## 2022-07-15 ENCOUNTER — Encounter (HOSPITAL_BASED_OUTPATIENT_CLINIC_OR_DEPARTMENT_OTHER): Payer: Self-pay | Admitting: Pulmonary Disease

## 2022-07-15 ENCOUNTER — Other Ambulatory Visit (HOSPITAL_COMMUNITY): Payer: Self-pay

## 2022-07-15 ENCOUNTER — Ambulatory Visit (INDEPENDENT_AMBULATORY_CARE_PROVIDER_SITE_OTHER): Payer: Medicare Other | Admitting: Pulmonary Disease

## 2022-07-15 ENCOUNTER — Telehealth (HOSPITAL_BASED_OUTPATIENT_CLINIC_OR_DEPARTMENT_OTHER): Payer: Self-pay

## 2022-07-15 VITALS — BP 110/58 | HR 73 | Ht 61.5 in | Wt 143.4 lb

## 2022-07-15 DIAGNOSIS — J9611 Chronic respiratory failure with hypoxia: Secondary | ICD-10-CM

## 2022-07-15 DIAGNOSIS — J9612 Chronic respiratory failure with hypercapnia: Secondary | ICD-10-CM

## 2022-07-15 DIAGNOSIS — J4550 Severe persistent asthma, uncomplicated: Secondary | ICD-10-CM | POA: Diagnosis not present

## 2022-07-15 DIAGNOSIS — J4489 Other specified chronic obstructive pulmonary disease: Secondary | ICD-10-CM | POA: Diagnosis not present

## 2022-07-15 LAB — HEMOGLOBIN A1C
Est. average glucose Bld gHb Est-mCnc: 128 mg/dL
Hgb A1c MFr Bld: 6.1 % — ABNORMAL HIGH (ref 4.8–5.6)

## 2022-07-15 MED ORDER — BUDESONIDE-FORMOTEROL FUMARATE 160-4.5 MCG/ACT IN AERO
2.0000 | INHALATION_SPRAY | Freq: Two times a day (BID) | RESPIRATORY_TRACT | 2 refills | Status: DC
Start: 2022-07-15 — End: 2022-10-15

## 2022-07-15 NOTE — Progress Notes (Signed)
Subjective:    Patient ID: Kim [name], female    DOB: 16-Apr-1955, 67 y.o.   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   Meds-stopped Daliresp due to skin peeling 01/2021 Dupixent caused facial edema and erythema     COPD exacerbations: Multiple since 2020, 2 hospitalizations in 2021 03/2020 hospitalized for 10 days for COPD exacerbation 09/2020 developed COVID infection, received monoclonal antibody 11/2020 adm for COPD exacerbation and stress-induced cardiomyopathy/acute systolic heart failure/type II myocardial infarction secondary to cardiomyopathy   02/2021 - treated for COPD flare, neighbor burning >> exposure to fumes  04/2021 video visit >> pred taper 06/2021 hosp visit for exac   08/2021 hosp for exac, SDH 09/2021 exac after reclast infusion   11/2021 She has repeated exacerbations with sudden explosive onset suggestive of asthma,in spite of maximal triple therapy and maintenance steroids >> initiated Dupixent , AEC 300 09/17/2021  18-month follow-up visit. At her last visit we had decreased prednisone to 10 mg alternating with 5 mg.  She states that this is worked well for her, couple of times she had had to increase prednisone back up to 40 mg for 2 days just as we had recommended. Pollen season is bothering her but not too bad. She has started on herbal remedies and she showed me 2 bottles. Spiriva was very expensive and she had to cut this out.  She has DuoNebs instead. Given Symbicort is starting to be very expensive 360 daughter every 71-month supply.  No pedal edema or chest pains or paroxysmal nocturnal dyspnea. She arrives in a wheelchair on oxygen today accompanied by her husband who corroborates history , Reviewed interim visit with cardiology   Significant tests/ events reviewed   09/2020 ABG 7.3  7/46/82 01/2017 alpha-1 >> 102 nml level ,PI* M1N   11/2014: FVC 2.00 L (63%) FEV1 1.00 L (41%) ratio 50  negative bronchodilator response TLC 5.06 L (103%) RV 140% DLCO uncorrected 51%     08/2021>> CT chest: No suspicious or indeterminate focal pulmonary nodule seen. LDCT chest 05/2021 LLL nodule 4 mm, stable CT angiogram chest 09/2020 no airspace disease   CT angiogram chest 03/24/2020  Severe centrilobular emphysema with mild superimposed diffuse ground-glass opacity with an apical predominance, similar in comparison to prior study. Findings likely reflect smoking related lung disease/respiratory bronchiolitis. New mild compression fracture deformity of L1 since 10/ 2021 01/2020 LDCT - stable, T7 fracture 12/2018 LD CT chest - stable nodules   10/2016 CT chest showed subcentimeter nodules which have been stable since 2017, right adrenal nodule was stable since 2015 and considered benign   Review of Systems neg for any significant sore throat, dysphagia, itching, sneezing, nasal congestion or excess/ purulent secretions, fever, chills, sweats, unintended wt loss, pleuritic or exertional cp, hempoptysis, orthopnea pnd or change in chronic leg swelling. Also denies presyncope, palpitations, heartburn, abdominal pain, nausea, vomiting, diarrhea or change in bowel or urinary habits, dysuria,hematuria, rash, arthralgias, visual complaints, headache, numbness weakness or ataxia.     Objective:   Physical Exam  Gen. Pleasant, well-nourished, in no distress ENT - no thrush, no pallor/icterus,no post nasal drip Neck: No JVD, no thyromegaly, no carotid bruits Lungs: no use of accessory muscles, no dullness to percussion, decreased bilateral without rales or rhonchi  Cardiovascular: Rhythm regular, heart sounds  normal, no murmurs or gallops, no peripheral edema  Musculoskeletal: No deformities, no cyanosis or clubbing        Assessment & Plan:

## 2022-07-15 NOTE — Assessment & Plan Note (Signed)
Continue oxygen 24/7. She will continue NIV during sleep she reports compliance with this.

## 2022-07-15 NOTE — Telephone Encounter (Signed)
Please advise which medication would be cheaper symbicort, advair, or wixela

## 2022-07-15 NOTE — Patient Instructions (Addendum)
[facility] Pharmacy to investigate copay between symbicort, advair or wixela  Refills on prednisone as needed  OK to use duonebs 3 times daily INSTEAD of spiriva

## 2022-07-15 NOTE — Assessment & Plan Note (Signed)
Continue 10 mg prednisone alternating with 5 mg -we will provide refills as needed. Symbicort is starting are too expensive for her to, we will investigate whether Wixela or Advair would be cheaper substitute. I have asked her to take a regimen of DuoNebs twice daily instead of Spiriva which was very expensive. We once again discussed strategy of increasing prednisone to 40 mg should she feel like she is having an exacerbation

## 2022-07-16 ENCOUNTER — Other Ambulatory Visit: Payer: Self-pay

## 2022-07-16 DIAGNOSIS — M8000XD Age-related osteoporosis with current pathological fracture, unspecified site, subsequent encounter for fracture with routine healing: Secondary | ICD-10-CM

## 2022-07-16 DIAGNOSIS — I10 Essential (primary) hypertension: Secondary | ICD-10-CM

## 2022-07-16 DIAGNOSIS — J441 Chronic obstructive pulmonary disease with (acute) exacerbation: Secondary | ICD-10-CM

## 2022-07-17 ENCOUNTER — Ambulatory Visit: Payer: Medicare Other | Attending: Cardiovascular Disease

## 2022-07-17 DIAGNOSIS — R55 Syncope and collapse: Secondary | ICD-10-CM | POA: Diagnosis not present

## 2022-07-22 ENCOUNTER — Other Ambulatory Visit (HOSPITAL_COMMUNITY): Payer: Self-pay

## 2022-07-22 ENCOUNTER — Other Ambulatory Visit: Payer: Self-pay | Admitting: Nurse Practitioner

## 2022-07-22 NOTE — Telephone Encounter (Signed)
Called and spoke with patient. Patient verbalized understanding. Nothing further needed.  

## 2022-07-22 NOTE — Telephone Encounter (Signed)
Symbicort is the only covered option at this time under the patients plan. Brand name is preferred. Refill is too soon to process due to being last filled 07/16/22 for 90 days

## 2022-07-31 ENCOUNTER — Ambulatory Visit (INDEPENDENT_AMBULATORY_CARE_PROVIDER_SITE_OTHER): Payer: Medicare Other | Admitting: Adult Health

## 2022-07-31 ENCOUNTER — Encounter: Payer: Self-pay | Admitting: Adult Health

## 2022-07-31 VITALS — BP 110/60 | HR 75 | Temp 98.2°F | Ht 61.5 in | Wt 140.0 lb

## 2022-07-31 DIAGNOSIS — R519 Headache, unspecified: Secondary | ICD-10-CM

## 2022-07-31 DIAGNOSIS — M159 Polyosteoarthritis, unspecified: Secondary | ICD-10-CM | POA: Diagnosis not present

## 2022-07-31 DIAGNOSIS — G8929 Other chronic pain: Secondary | ICD-10-CM

## 2022-07-31 MED ORDER — BUTALBITAL-APAP-CAFFEINE 50-325-40 MG PO TABS
1.0000 | ORAL_TABLET | ORAL | 3 refills | Status: DC | PRN
Start: 2022-07-31 — End: 2022-09-24

## 2022-07-31 NOTE — Progress Notes (Signed)
Subjective:    Patient ID: Kim [name], female    DOB: [birth year], 67 y.o.   MRN: [id number]  HPI he presents to the office today for chronic pain management visit   NCCSRS reviewed in EPIC   Indication for chronic opioid: Osteoarthritis involving multiple joints  Medication and dose: Norco 7.5-325 mg Q6 H PRN  # pills per month: 45 Last UDS date: 01/29/2022 Opioid Treatment Agreement signed: 01/29/2022 Opioid Treatment Agreement last reviewed with patient: Today  NCCSRS reviewed this encounter (include red flags):  Today   She also needs a refill of Fioricet that she takes PRN for headaches.    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   OSTEOARTHRITIS 06/05/2009   PARESTHESIA 08/24/2007   TOBACCO ABUSE 01/25/2010    Social History   Socioeconomic History   Marital status: Married    Spouse name: Thereasa Distance   Number of children: 2   Years of education: Not on file   Highest education level: Not on file  Occupational History   Occupation: Patient care aide-sits with elderly lady  Tobacco Use   Smoking status: Former    Packs/day: 2.00    Years: 38.00    Additional pack years: 0.00    Total pack years: 76.00    Types: Cigarettes    Quit date: 03/07/2014    Years since quitting: 8.4   Smokeless tobacco: Never  Vaping Use   Vaping Use: Never used  Substance and Sexual Activity   Alcohol use: No    Alcohol/week: 0.0 standard drinks of alcohol   Drug use: No   Sexual activity: Not on file  Other Topics Concern   Not on file  Social History Narrative   [facility] (01/12/17):   Originally from [facility]. Has lived in [place] for 17 years. Married. Has 2 dogs currently. No mold and only 1 indoor plant. Had her home professionally cleaned a month ago. No bird or hot tub exposure. 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    Right handed    Social Determinants of Health   Financial Resource Strain: High Risk (09/10/2021)   Overall Financial Resource Strain (CARDIA)    Difficulty of Paying Living Expenses: Very hard  Food Insecurity: No Food Insecurity (09/10/2021)   Hunger Vital Sign    Worried About Running Out of Food in the Last Year: Never true    Ran Out of Food in the Last Year: Never true  Transportation Needs: No Transportation Needs (09/10/2021)   PRAPARE - Administrator, Civil Service (Medical): No    Lack of Transportation (Non-Medical): No  Physical Activity: Inactive (03/25/2021)   Exercise Vital Sign    Days of Exercise per Week: 0 days    Minutes of Exercise per Session: 0 min  Stress: No Stress Concern Present (09/10/2021)   [employer] of Occupational Health - Occupational Stress Questionnaire    Feeling of Stress : Not at all  Social Connections: Moderately Isolated (03/25/2021)   Social Connection and Isolation Panel [NHANES]    Frequency of Communication with Friends and Family: More than three times a week    Frequency of Social Gatherings with Friends and Family: More than three times a week    Attends Religious Services: Never  Active Member of Clubs or Organizations: No    Attends Banker Meetings: Never    Marital Status: Married  Catering manager Violence: Not At Risk (03/25/2021)   Humiliation, Afraid, Rape, and Kick questionnaire    Fear of Current or Ex-Partner: No    Emotionally Abused: No    Physically Abused: No    Sexually Abused: No    Past Surgical History:  Procedure Laterality Date   ABDOMINAL HYSTERECTOMY     APPENDECTOMY  1969   CARDIAC CATHETERIZATION N/A 07/05/2015   Procedure: Left Heart Cath and Coronary Angiography;  Surgeon: Marykay Lex, MD;  Location: [facility] INVASIVE CV LAB;  Service: Cardiovascular;  Laterality: N/A;   CHOLECYSTECTOMY  1989   collapse lung  1990    COLONOSCOPY WITH PROPOFOL N/A 05/14/2018   Procedure: COLONOSCOPY WITH PROPOFOL;  Surgeon: Hilarie Fredrickson, MD;  Location: [facility] ENDOSCOPY;  Service: Endoscopy;  Laterality: N/A;   EXCISION MASS ABDOMINAL  2019   EXPLORATORY LAPAROTOMY  1969   LEFT HEART CATH AND CORONARY ANGIOGRAPHY N/A 11/19/2020   Procedure: LEFT HEART CATH AND CORONARY ANGIOGRAPHY;  Surgeon: Yvonne Kendall, MD;  Location: [facility] INVASIVE CV LAB;  Service: Cardiovascular;  Laterality: N/A;   POLYPECTOMY  05/14/2018   Procedure: POLYPECTOMY;  Surgeon: Hilarie Fredrickson, MD;  Location: [facility] ENDOSCOPY;  Service: Endoscopy;;   SALPINGOOPHORECTOMY  2019    Family History  Problem Relation Age of Onset   Cancer Mother        lung   Heart attack Mother    Hypertension Sister    Multiple sclerosis Brother    Diabetes Sister    Rheum arthritis Sister    Thyroid disease Sister    Multiple sclerosis Other    Alcohol abuse Other    Arthritis Other    Diabetes Other    Kidney disease Other    Cancer Other        lung,ovarian,skin, uterine   Stroke Other    Heart disease Other    Melanoma Other    Osteoporosis Other    Heart attack Maternal Uncle    Heart attack Other        NEICE   Heart attack Other        NEPHEW   Hypertension Brother    Stroke Maternal Grandmother    Colon cancer Neg Hx     Allergies  Allergen Reactions   Tape Other (See Comments)    Skin tears easily, peels skin off   Daliresp [Roflumilast]     Skin peeling   Dupixent [Dupilumab]     Cough, facial swelling, diarrhea, headache   Latex Other (See Comments)    Peels skin off   Lisinopril Cough   Morphine And Related     hallucinations   Penicillins Hives and Other (See Comments)    Tolerates Omnicef Has patient had a PCN reaction causing immediate rash, facial/tongue/throat swelling, SOB or lightheadedness with hypotension: Yes Has patient had a PCN reaction causing severe rash involving mucus membranes or skin necrosis: No Has patient had a PCN  reaction that required hospitalization No Has patient had a PCN reaction occurring within the last 10 years: No If all of the above answers are "NO", then may proceed with Cephalosporin use.    Statins Other (See Comments)    Myalgia    Omnicef [Cefdinir] Rash    Current Outpatient Medications on File Prior to Visit  Medication Sig Dispense Refill   acetaminophen (TYLENOL) 500 MG  tablet Take 500 mg by mouth 2 (two) times daily as needed for headache.     albuterol (VENTOLIN HFA) 108 (90 Base) MCG/ACT inhaler INHALE 2 PUFFS INTO THE LUNGS EVERY 4 TO 6 HOURS AS NEEDED FOR SHORTNESS OF BREATH OR WHEEZING 18 g 3   ALPRAZolam (XANAX) 0.5 MG tablet TAKE 1 TABLET(0.5 MG) BY MOUTH THREE TIMES DAILY AS NEEDED FOR ANXIETY 90 tablet 2   aspirin EC 81 MG EC tablet Take 1 tablet (81 mg total) by mouth daily.     bisoprolol (ZEBETA) 5 MG tablet Take 0.5 tablets (2.5 mg total) by mouth daily as needed (Take HALF tablet by mouth for break through chest pain as needed). 45 tablet 3   budesonide-formoterol (SYMBICORT) 160-4.5 MCG/ACT inhaler Inhale 2 puffs into the lungs 2 (two) times daily. 30 g 2   butalbital-acetaminophen-caffeine (FIORICET) 50-325-40 MG tablet Take 1 tablet by mouth every 4 (four) hours as needed for headache. TAKE 1 TABLET BY MOUTH EVERY 4 HOURS AS NEEDED FOR HEADACHETAKE 1 TABLET BY MOUTH EVERY 4 HOURS AS NEEDED FOR HEADACHE 14 tablet 3   cetirizine (ZYRTEC) 10 MG tablet Take 10 mg by mouth daily.     Cholecalciferol (VITAMIN D3 PO) Take 5,000 Units by mouth daily.     ezetimibe (ZETIA) 10 MG tablet TAKE 1 TABLET(10 MG) BY MOUTH DAILY 90 tablet 1   furosemide (LASIX) 40 MG tablet Take 1 tablet (40 mg total) by mouth daily. 90 tablet 3   HYDROcodone-acetaminophen (NORCO) 7.5-325 MG tablet Take 1 tablet by mouth every 6 (six) hours as needed for moderate pain. 30 tablet 0   HYDROcodone-acetaminophen (NORCO) 7.5-325 MG tablet Take 1 tablet by mouth every 6 (six) hours as needed for moderate  pain. 30 tablet 0   HYDROcodone-acetaminophen (NORCO) 7.5-325 MG tablet Take 1 tablet by mouth every 6 (six) hours as needed for moderate pain. 30 tablet 0   ipratropium-albuterol (DUONEB) 0.5-2.5 (3) MG/3ML SOLN USE 3 ML VIA NEBULIZER EVERY 6 HOURS AS NEEDED 360 mL 5   magnesium oxide (MAG-OX) 400 MG tablet Take 400 mg by mouth daily.     MUCINEX MAXIMUM STRENGTH 1200 MG TB12 Take 1,200 mg by mouth every 12 (twelve) hours.     nitroGLYCERIN (NITROSTAT) 0.4 MG SL tablet Place 1 tablet (0.4 mg total) under the tongue every 5 (five) minutes as needed for chest pain. 25 tablet 3   ondansetron (ZOFRAN) 4 MG tablet Take 1 tablet (4 mg total) by mouth every 8 (eight) hours as needed for nausea or vomiting. 20 tablet 0   OXYGEN Inhale 2 L/min into the lungs continuous.     PARoxetine (PAXIL) 40 MG tablet TAKE 1 TABLET(40 MG) BY MOUTH EVERY MORNING 90 tablet 1   potassium chloride (KLOR-CON) 10 MEQ tablet Take 1 tablet (10 mEq total) by mouth as needed (take with furosemide (lasix)). 30 tablet 11   predniSONE (DELTASONE) 10 MG tablet TAKE 1 TABLET(10 MG) BY MOUTH DAILY 60 tablet 2   predniSONE (DELTASONE) 5 MG tablet Take 1 tablet (5 mg total) by mouth daily with breakfast. 60 tablet 1   rosuvastatin (CRESTOR) 20 MG tablet Take 1 tablet (20 mg total) by mouth daily. 90 tablet 3   Spacer/Aero Chamber Mouthpiece MISC 1 Device by Does not apply route as directed. 1 each 0   No current facility-administered medications on file prior to visit.    BP 110/60   Pulse 75   Temp 98.2 F (36.8 C) (Oral)  Ht 5' 1.5" (1.562 m)   Wt 140 lb (63.5 kg)   SpO2 94%   BMI 26.02 kg/m       Objective:   Physical Exam Vitals and nursing note reviewed.  Constitutional:      Appearance: Normal appearance.  Cardiovascular:     Rate and Rhythm: Normal rate and regular rhythm.     Pulses: Normal pulses.     Heart sounds: Normal heart sounds.  Pulmonary:     Effort: Pulmonary effort is normal.     Breath  sounds: Normal breath sounds.     Comments: 2L via [place] Musculoskeletal:        General: Normal range of motion.  Skin:    General: Skin is warm and dry.  Neurological:     General: No focal deficit present.     Mental Status: She is alert and oriented to person, place, and time.  Psychiatric:        Mood and Affect: Mood normal.        Behavior: Behavior normal.        Thought Content: Thought content normal.        Judgment: Judgment normal.        Assessment & Plan:  1. Encounter for chronic pain management  - DRUG MONITOR, PANEL 1, W/CONF, URINE; Future  2. Osteoarthritis of multiple joints, unspecified osteoarthritis type  - DRUG MONITOR, PANEL 1, W/CONF, URINE; Future  3. Chronic intractable headache, unspecified headache type  - butalbital-acetaminophen-caffeine (FIORICET) 50-325-40 MG tablet; Take 1 tablet by mouth every 4 (four) hours as needed for headache. TAKE 1 TABLET BY MOUTH EVERY 4 HOURS AS NEEDED FOR HEADACHETAKE 1 TABLET BY MOUTH EVERY 4 HOURS AS NEEDED FOR HEADACHE  Dispense: 14 tablet; Refill: 3

## 2022-08-02 LAB — DRUG MONITOR, PANEL 1, W/CONF, URINE
Alphahydroxyalprazolam: 405 ng/mL — ABNORMAL HIGH (ref <25)
Alphahydroxymidazolam: NEGATIVE ng/mL (ref <50)
Alphahydroxytriazolam: NEGATIVE ng/mL (ref <50)
Aminoclonazepam: NEGATIVE ng/mL (ref <25)
Amphetamines: NEGATIVE ng/mL (ref <500)
Barbiturates: NEGATIVE ng/mL (ref <300)
Benzodiazepines: POSITIVE ng/mL — AB (ref <100)
Cocaine Metabolite: NEGATIVE ng/mL (ref <150)
Codeine: NEGATIVE ng/mL (ref <50)
Creatinine: 139.2 mg/dL (ref >=20.0)
Hydrocodone: 1118 ng/mL — ABNORMAL HIGH (ref <50)
Hydromorphone: 78 ng/mL — ABNORMAL HIGH (ref <50)
Hydroxyethylflurazepam: NEGATIVE ng/mL (ref <50)
Lorazepam: NEGATIVE ng/mL (ref <50)
Marijuana Metabolite: NEGATIVE ng/mL (ref <20)
Methadone Metabolite: NEGATIVE ng/mL (ref <100)
Morphine: NEGATIVE ng/mL (ref <50)
Nordiazepam: NEGATIVE ng/mL (ref <50)
Norhydrocodone: 1273 ng/mL — ABNORMAL HIGH (ref <50)
Opiates: POSITIVE ng/mL — AB (ref <100)
Oxazepam: NEGATIVE ng/mL (ref <50)
Oxidant: NEGATIVE ug/mL (ref <200)
Oxycodone: NEGATIVE ng/mL (ref <100)
Phencyclidine: NEGATIVE ng/mL (ref <25)
Temazepam: NEGATIVE ng/mL (ref <50)
pH: 5.7 (ref 4.5–9.0)

## 2022-08-02 LAB — DM TEMPLATE

## 2022-08-04 ENCOUNTER — Telehealth: Payer: Self-pay | Admitting: Adult Health

## 2022-08-04 NOTE — Telephone Encounter (Signed)
Called patient to schedule Medicare Annual Wellness Visit (AWV). Left message for patient to call back and schedule Medicare Annual Wellness Visit (AWV).  Last date of AWV: 04/04/21  Please schedule an appointment at any time with [facility] or [employer] [facility].  If any questions, please contact me at [phone].  Thank you ,  [name] AWV direct phone # [phone]

## 2022-08-04 NOTE — Telephone Encounter (Signed)
Contacted Kim [name] to schedule their annual wellness visit. Patient declined to schedule AWV at this time.  Do not call   [name] AWV direct phone # [phone]  Spoke with patient to schedule AWV.  Patient stated she is not interested in doing awv.  Do not call

## 2022-08-05 ENCOUNTER — Other Ambulatory Visit: Payer: Self-pay | Admitting: Adult Health

## 2022-08-05 DIAGNOSIS — M159 Polyosteoarthritis, unspecified: Secondary | ICD-10-CM

## 2022-08-05 DIAGNOSIS — G8929 Other chronic pain: Secondary | ICD-10-CM

## 2022-08-05 MED ORDER — HYDROCODONE-ACETAMINOPHEN 7.5-325 MG PO TABS
1.0000 | ORAL_TABLET | Freq: Four times a day (QID) | ORAL | 0 refills | Status: DC | PRN
Start: 2022-08-05 — End: 2022-09-12

## 2022-08-05 MED ORDER — HYDROCODONE-ACETAMINOPHEN 7.5-325 MG PO TABS
1.0000 | ORAL_TABLET | Freq: Four times a day (QID) | ORAL | 0 refills | Status: DC | PRN
Start: 2022-08-05 — End: 2022-11-04

## 2022-08-19 ENCOUNTER — Ambulatory Visit: Payer: Medicare Other | Admitting: Endocrinology

## 2022-08-25 ENCOUNTER — Ambulatory Visit: Payer: Medicare Other | Attending: Cardiovascular Disease

## 2022-08-25 DIAGNOSIS — E785 Hyperlipidemia, unspecified: Secondary | ICD-10-CM

## 2022-08-26 ENCOUNTER — Other Ambulatory Visit (HOSPITAL_COMMUNITY): Payer: Self-pay | Admitting: Nurse Practitioner

## 2022-08-26 DIAGNOSIS — R55 Syncope and collapse: Secondary | ICD-10-CM

## 2022-08-26 LAB — HEPATIC FUNCTION PANEL
ALT: 18 IU/L (ref 0–32)
AST: 23 IU/L (ref 0–40)
Albumin: 4.4 g/dL (ref 3.9–4.9)
Alkaline Phosphatase: 68 IU/L (ref 44–121)
Bilirubin Total: 0.3 mg/dL (ref 0.0–1.2)
Bilirubin, Direct: <0.1 mg/dL (ref 0.00–0.40)
Total Protein: 6.3 g/dL (ref 6.0–8.5)

## 2022-08-26 LAB — LDL CHOLESTEROL, DIRECT: LDL Direct: 54 mg/dL (ref 0–99)

## 2022-08-26 LAB — LIPID PANEL
Chol/HDL Ratio: 2.2 ratio (ref 0.0–4.4)
Cholesterol, Total: 156 mg/dL (ref 100–199)
HDL: 71 mg/dL (ref >39)
LDL Chol Calc (NIH): 54 mg/dL (ref 0–99)
Triglycerides: 190 mg/dL — ABNORMAL HIGH (ref 0–149)
VLDL Cholesterol Cal: 31 mg/dL (ref 5–40)

## 2022-09-06 ENCOUNTER — Other Ambulatory Visit (HOSPITAL_BASED_OUTPATIENT_CLINIC_OR_DEPARTMENT_OTHER): Payer: Self-pay | Admitting: Pulmonary Disease

## 2022-09-06 ENCOUNTER — Other Ambulatory Visit: Payer: Self-pay | Admitting: Adult Health

## 2022-09-06 DIAGNOSIS — F419 Anxiety disorder, unspecified: Secondary | ICD-10-CM

## 2022-09-06 DIAGNOSIS — I5032 Chronic diastolic (congestive) heart failure: Secondary | ICD-10-CM

## 2022-09-06 DIAGNOSIS — J441 Chronic obstructive pulmonary disease with (acute) exacerbation: Secondary | ICD-10-CM

## 2022-09-06 DIAGNOSIS — I10 Essential (primary) hypertension: Secondary | ICD-10-CM

## 2022-09-08 ENCOUNTER — Encounter (HOSPITAL_COMMUNITY): Payer: Self-pay | Admitting: Emergency Medicine

## 2022-09-08 ENCOUNTER — Ambulatory Visit (INDEPENDENT_AMBULATORY_CARE_PROVIDER_SITE_OTHER): Payer: Medicare Other

## 2022-09-08 ENCOUNTER — Ambulatory Visit: Payer: Medicare Other | Admitting: Adult Health

## 2022-09-08 ENCOUNTER — Ambulatory Visit (HOSPITAL_COMMUNITY)
Admission: EM | Admit: 2022-09-08 | Discharge: 2022-09-08 | Disposition: A | Discharge status: Home / Self Care | Payer: Medicare Other | Attending: Internal Medicine | Admitting: Internal Medicine

## 2022-09-08 ENCOUNTER — Other Ambulatory Visit: Payer: Self-pay

## 2022-09-08 DIAGNOSIS — J9612 Chronic respiratory failure with hypercapnia: Secondary | ICD-10-CM | POA: Diagnosis not present

## 2022-09-08 DIAGNOSIS — S51802A Unspecified open wound of left forearm, initial encounter: Secondary | ICD-10-CM

## 2022-09-08 DIAGNOSIS — J441 Chronic obstructive pulmonary disease with (acute) exacerbation: Secondary | ICD-10-CM

## 2022-09-08 DIAGNOSIS — J9611 Chronic respiratory failure with hypoxia: Secondary | ICD-10-CM

## 2022-09-08 DIAGNOSIS — Z23 Encounter for immunization: Secondary | ICD-10-CM | POA: Diagnosis not present

## 2022-09-08 MED ORDER — AZITHROMYCIN 250 MG PO TABS: 250.0000 mg | ORAL_TABLET | Freq: Every day | ORAL | 0 refills | Status: DC

## 2022-09-08 MED ORDER — TETANUS-DIPHTH-ACELL PERTUSSIS 5-2.5-18.5 LF-MCG/0.5 IM SUSY
0.5000 mL | PREFILLED_SYRINGE | Freq: Once | INTRAMUSCULAR | Status: AC
  Administered 2022-09-08: 0.5 mL via INTRAMUSCULAR

## 2022-09-08 MED ORDER — TETANUS-DIPHTH-ACELL PERTUSSIS 5-2.5-18.5 LF-MCG/0.5 IM SUSY
PREFILLED_SYRINGE | INTRAMUSCULAR | Status: AC
  Filled 2022-09-08: qty 0.5

## 2022-09-08 NOTE — ED Notes (Signed)
Patient access requested patient be evaluated in lobby.  Patient reports breathing is changing and reports when she feels like this she starts prednisone-pcp has prescribed prednisone to be initiated at patient;s discretion.  Started prednisone 2 days ago.  Patient thinks she needs an antibiotic.  Patient is on 3 Liters home O2 in lobby   Patient is alert and oriented, answering all questions appropriately.  Skin cool and dry.    Patient also has a bandaged arm.  Injury occurred last week when patient passed out .  Family member and patient both report she has a 2 year history of "passing out" and no one has figured this out.  They are requesting left forearm be looked at while she is in department.

## 2022-09-08 NOTE — ED Provider Notes (Signed)
[facility]    CSN: [id number] Arrival date & time: 09/08/22  1116      History   Chief Complaint Chief Complaint  Patient presents with   Shortness of Breath    HPI Kim [name] is a 67 y.o. female.   Patient with history of COPD, asthma, chronic respiratory failure with hypoxia and hypercapnia, CHF, orthostatic hypotension, and tobacco abuse presents to urgent care for evaluation of shortness of breath and worsening chronic cough with green/yellow sputum starting 2 days ago on Saturday September 06, 2022. Denies recent fever, chills, body aches, chest pain, nausea, vomiting, abdominal pain, rash, headaches, or decreased appetite. No recent known sick contacts with similar symptoms. She is a former smoker and quit 8 years ago. She uses duoneb breathing treatments at home and states this has helped a little bit with shortness of breath. She uses 2L O2 by nasal cannula at home 24/7 and 3L when she is out moving around outside of the house. Denies recent need to increase oxygen. Denies recent antibiotic use. She has been taking a steroid dose pak prescribed by her pulmonologist for the last 3-4 days and is requesting antibiotic today.   She would also like to be evaluated for left arm laceration/skin tear that happened greater than 24 hours ago after she became dizzy with standing and fell down. She caught herself on her left arm when she fell and states she believes she may have passed out. She does not believe she hit her head. States she has syncopal episodes frequently due to orthostatic hypotension. She is not currently dizzy, denies nausea/vomiting/headache after fall, and does not take blood thinning medications. Bleeding well controlled to skin tear with pressure. She has been using vasaline and non-stick gauze to the area.      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   COPD 08/31/2007   DEPRESSION 11/19/2006   DYSLIPIDEMIA 03/12/2009   MI  (mitral incompetence)    NEPHROLITHIASIS 01/05/2008   OSTEOARTHRITIS 06/05/2009   PARESTHESIA 08/24/2007   TOBACCO ABUSE 01/25/2010    Patient Active Problem List   Diagnosis Date Noted   Acute on chronic respiratory failure with hypoxia and hypercapnia (HCC) 09/10/2022   Myalgia due to statin 06/05/2022   Medication reaction 02/12/2022   NSVT (nonsustained ventricular tachycardia) (HCC) 10/11/2021   Hyperkalemia 09/22/2021   SOB (shortness of breath) 09/19/2021   Chronic respiratory failure with hypoxia and hypercapnia (HCC)    DNR (do not resuscitate) 09/17/2021   COPD with acute exacerbation (HCC) 08/26/2021   Double vision 08/26/2021   AKI (acute kidney injury) (HCC) 08/26/2021   Postconcussive syndrome 08/26/2021   Elevated troponin 08/26/2021   Subarachnoid hemorrhage (HCC) 08/26/2021   Syncope and collapse 08/26/2021   Heart failure with preserved ejection fraction (HCC) 08/26/2021   NSTEMI (non-ST elevated myocardial infarction) (HCC) 11/17/2020   Osteoporosis 06/05/2020   Chronic combined systolic (congestive) and diastolic (congestive) heart failure (HCC) 03/24/2020   Essential hypertension 03/24/2020   Vertebral fracture, osteoporotic (HCC) 03/05/2020   History of colonic polyps    Benign neoplasm of ascending colon    Neuritis 01/29/2017   Alpha-1-antitrypsin deficiency carrier 01/19/2017   Dependent edema 01/14/2017   Orthostatic hypotension    Allergic rhinitis 08/10/2015   Hyperglycemia    HLD (hyperlipidemia)    Coronary artery calcification seen on CAT scan 07/02/2015   Solitary pulmonary nodule 12/02/2013   Loss of weight 11/10/2013   Osteoarthritis 06/05/2009  Brachial neuritis or radiculitis 06/05/2009   Dyslipidemia 03/12/2009   NEPHROLITHIASIS 01/05/2008   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   PARESTHESIA 08/24/2007   Anxiety and depression 11/19/2006    Past Surgical History:  Procedure Laterality Date   ABDOMINAL HYSTERECTOMY      APPENDECTOMY  1969   CARDIAC CATHETERIZATION N/A 07/05/2015   Procedure: Left Heart Cath and Coronary Angiography;  Surgeon: Marykay Lex, MD;  Location: [facility] INVASIVE CV LAB;  Service: Cardiovascular;  Laterality: N/A;   CHOLECYSTECTOMY  1989   collapse lung  1990   COLONOSCOPY WITH PROPOFOL N/A 05/14/2018   Procedure: COLONOSCOPY WITH PROPOFOL;  Surgeon: Hilarie Fredrickson, MD;  Location: [facility];  Service: Endoscopy;  Laterality: N/A;   EXCISION MASS ABDOMINAL  2019   EXPLORATORY LAPAROTOMY  1969   LEFT HEART CATH AND CORONARY ANGIOGRAPHY N/A 11/19/2020   Procedure: LEFT HEART CATH AND CORONARY ANGIOGRAPHY;  Surgeon: Yvonne Kendall, MD;  Location: [facility] INVASIVE CV LAB;  Service: Cardiovascular;  Laterality: N/A;   POLYPECTOMY  05/14/2018   Procedure: POLYPECTOMY;  Surgeon: Hilarie Fredrickson, MD;  Location: [facility];  Service: Endoscopy;;   SALPINGOOPHORECTOMY  2019    OB History   No obstetric history on file.      Home Medications    Prior to Admission medications   Medication Sig Start Date End Date Taking? Authorizing Provider  azithromycin (ZITHROMAX) 250 MG tablet Take 1 tablet (250 mg total) by mouth daily. Take first 2 tablets together, then 1 every day until finished. 09/08/22  Yes Carlisle Beers, FNP  acetaminophen (TYLENOL) 500 MG tablet Take 500 mg by mouth 2 (two) times daily as needed for headache.    [provider]  albuterol (VENTOLIN HFA) 108 (90 Base) MCG/ACT inhaler INHALE 2 PUFFS INTO THE LUNGS EVERY 4 TO 6 HOURS AS NEEDED FOR SHORTNESS OF BREATH OR WHEEZING 07/23/22   Cobb, Ruby Cola, NP  ALPRAZolam (XANAX) 0.5 MG tablet TAKE 1 TABLET(0.5 MG) BY MOUTH THREE TIMES DAILY AS NEEDED FOR ANXIETY 05/21/22   Nafziger, Kandee Keen, NP  aspirin EC 81 MG EC tablet Take 1 tablet (81 mg total) by mouth daily. 07/08/15   Rhetta Mura, MD  bisoprolol (ZEBETA) 5 MG tablet Take 0.5 tablets (2.5 mg total) by mouth daily as needed (Take HALF tablet by mouth for break  through chest pain as needed). 10/14/21   Marinus Maw, MD  budesonide-formoterol [facility]) 160-4.5 MCG/ACT inhaler Inhale 2 puffs into the lungs 2 (two) times daily. 07/15/22   Oretha Milch, MD  butalbital-acetaminophen-caffeine (FIORICET) [phone] MG tablet Take 1 tablet by mouth every 4 (four) hours as needed for headache. TAKE 1 TABLET BY MOUTH EVERY 4 HOURS AS NEEDED FOR HEADACHETAKE 1 TABLET BY MOUTH EVERY 4 HOURS AS NEEDED FOR HEADACHE 07/31/22   Nafziger, Kandee Keen, NP  cetirizine (ZYRTEC) 10 MG tablet Take 10 mg by mouth daily.    [provider]  Cholecalciferol (VITAMIN D3 PO) Take 5,000 Units by mouth daily.    [provider]  ezetimibe (ZETIA) 10 MG tablet TAKE 1 TABLET(10 MG) BY MOUTH DAILY 01/14/22   Nafziger, Kandee Keen, NP  furosemide (LASIX) 40 MG tablet Take 1 tablet (40 mg total) by mouth daily. 04/14/22   Kathleene Hazel, MD  HYDROcodone-acetaminophen (NORCO) 7.5-325 MG tablet Take 1 tablet by mouth every 6 (six) hours as needed for moderate pain. 08/05/22   Nafziger, Kandee Keen, NP  HYDROcodone-acetaminophen (NORCO) 7.5-325 MG tablet Take 1 tablet by mouth every 6 (  six) hours as needed for moderate pain. 08/05/22   Nafziger, Kandee Keen, NP  HYDROcodone-acetaminophen (NORCO) 7.5-325 MG tablet Take 1 tablet by mouth every 6 (six) hours as needed for moderate pain. 08/05/22   Nafziger, Kandee Keen, NP  ipratropium-albuterol (DUONEB) 0.5-2.5 (3) MG/3ML SOLN USE 3 ML VIA NEBULIZER EVERY 6 HOURS AS NEEDED 05/16/22   Oretha Milch, MD  magnesium oxide (MAG-OX) 400 MG tablet Take 400 mg by mouth daily.    [provider]  MUCINEX MAXIMUM STRENGTH 1200 MG TB12 Take 1,200 mg by mouth every 12 (twelve) hours.    [provider]  nitroGLYCERIN (NITROSTAT) 0.4 MG SL tablet Place 1 tablet (0.4 mg total) under the tongue every 5 (five) minutes as needed for chest pain. 10/24/20   Kathleene Hazel, MD  ondansetron (ZOFRAN) 4 MG tablet Take 1 tablet (4 mg total) by mouth every 8  (eight) hours as needed for nausea or vomiting. 09/05/21   Nafziger, Kandee Keen, NP  OXYGEN Inhale 2 L/min into the lungs continuous.    [provider]  PARoxetine (PAXIL) 40 MG tablet TAKE 1 TABLET(40 MG) BY MOUTH EVERY MORNING 04/08/22   Nafziger, Kandee Keen, NP  potassium chloride (KLOR-CON) 10 MEQ tablet Take 1 tablet (10 mEq total) by mouth as needed (take with furosemide (lasix)). 06/05/22   Kathleene Hazel, MD  predniSONE (DELTASONE) 10 MG tablet TAKE 1 TABLET(10 MG) BY MOUTH DAILY 05/16/22   Oretha Milch, MD  predniSONE (DELTASONE) 5 MG tablet TAKE 1 TABLET(5 MG) BY MOUTH DAILY WITH BREAKFAST 09/08/22   Oretha Milch, MD  rosuvastatin (CRESTOR) 20 MG tablet Take 1 tablet (20 mg total) by mouth daily. 07/14/22   Kathleene Hazel, MD  Spacer/Aero Chamber Mouthpiece MISC 1 Device by Does not apply route as directed. 01/12/17   Roslynn Amble, MD    Family History Family History  Problem Relation Age of Onset   Cancer Mother        lung   Heart attack Mother    Hypertension Sister    Multiple sclerosis Brother    Diabetes Sister    Rheum arthritis Sister    Thyroid disease Sister    Multiple sclerosis Other    Alcohol abuse Other    Arthritis Other    Diabetes Other    Kidney disease Other    Cancer Other        lung,ovarian,skin, uterine   Stroke Other    Heart disease Other    Melanoma Other    Osteoporosis Other    Heart attack Maternal Uncle    Heart attack Other        NEICE   Heart attack Other        NEPHEW   Hypertension Brother    Stroke Maternal Grandmother    Colon cancer Neg Hx     Social History Social History   Tobacco Use   Smoking status: Former    Packs/day: 2.00    Years: 38.00    Additional pack years: 0.00    Total pack years: 76.00    Types: Cigarettes    Quit date: 03/07/2014    Years since quitting: 8.5   Smokeless tobacco: Never  Vaping Use   Vaping Use: Never used  Substance Use Topics   Alcohol use: No    Alcohol/week:  0.0 standard drinks of alcohol   Drug use: No     Allergies   Tape, Daliresp [roflumilast], Dupixent [dupilumab], Latex, Lisinopril, Morphine and codeine,  Penicillins, Statins, and Omnicef [cefdinir]   Review of Systems Review of Systems Per HPI  Physical Exam Triage Vital Signs ED Triage Vitals  Enc Vitals Group     BP 09/08/22 1242 120/74     Pulse Rate 09/08/22 1120 87     Resp 09/08/22 1120 (!) 30     Temp 09/08/22 1242 97.9 F (36.6 C)     Temp Source 09/08/22 1242 Oral     SpO2 09/08/22 1120 96 %     Weight --      Height --      Head Circumference --      Peak Flow --      Pain Score 09/08/22 1239 4     Pain Loc --      Pain Edu? --      Excl. in GC? --    No data found.  Updated Vital Signs BP 120/74 (BP Location: Right Arm)   Pulse 74   Temp 97.9 F (36.6 C) (Oral)   Resp (!) 28   SpO2 96%   Visual Acuity Right Eye Distance:   Left Eye Distance:   Bilateral Distance:    Right Eye Near:   Left Eye Near:    Bilateral Near:     Physical Exam Vitals and nursing note reviewed.  Constitutional:      Appearance: She is not ill-appearing or toxic-appearing.     Interventions: She is not intubated. HENT:     Head: Normocephalic and atraumatic.     Right Ear: Hearing, tympanic membrane, ear canal and external ear normal.     Left Ear: Hearing, tympanic membrane, ear canal and external ear normal.     Nose: Nose normal.     Mouth/Throat:     Lips: Pink.     Mouth: Mucous membranes are moist. No injury.     Tongue: No lesions. Tongue does not deviate from midline.     Palate: No mass and lesions.     Pharynx: Oropharynx is clear. Uvula midline. No pharyngeal swelling, oropharyngeal exudate, posterior oropharyngeal erythema or uvula swelling.     Tonsils: No tonsillar exudate or tonsillar abscesses.  Eyes:     General: Lids are normal. Vision grossly intact. Gaze aligned appropriately.     Extraocular Movements: Extraocular movements intact.      Conjunctiva/sclera: Conjunctivae normal.  Cardiovascular:     Rate and Rhythm: Normal rate and regular rhythm.     Pulses:          Radial pulses are 1+ on the right side and 1+ on the left side.     Heart sounds: Normal heart sounds, S1 normal and S2 normal.  Pulmonary:     Effort: Pulmonary effort is normal. Prolonged expiration present. No tachypnea (Baseline), accessory muscle usage, respiratory distress or retractions. She is not intubated.     Breath sounds: Normal air entry. No stridor. Wheezing (Diffuse expiratory wheezing throughout) present. No decreased breath sounds, rhonchi or rales.  Musculoskeletal:     Cervical back: Neck supple. No tenderness.     Right lower leg: No tenderness. No edema.     Left lower leg: No tenderness. No edema.  Skin:    General: Skin is warm and dry.     Capillary Refill: Capillary refill takes less than 2 seconds.     Findings: No rash.          Comments: See image below for details of skin avulsion.   Neurological:  General: No focal deficit present.     Mental Status: She is alert and oriented to person, place, and time. Mental status is at baseline.     Cranial Nerves: No dysarthria or facial asymmetry.  Psychiatric:        Mood and Affect: Mood normal.        Speech: Speech normal.        Behavior: Behavior normal.        Thought Content: Thought content normal.        Judgment: Judgment normal.      UC Treatments / Results  Labs (all labs ordered are listed, but only abnormal results are displayed) Labs Reviewed - No data to display  EKG   Radiology   Procedures Procedures (including critical care time)  Medications Ordered in UC Medications  Tdap (BOOSTRIX) injection 0.5 mL (0.5 mLs Intramuscular Given 09/08/22 1415)    Initial Impression / Assessment and Plan / UC Course  I have reviewed the triage vital signs and the nursing notes.  Pertinent labs & imaging results that were available during my care of the  patient were reviewed by me and considered in my medical decision making (see chart for details).   1. COPD exacerbation, chronic respiratory failure with hypoxia and hypercapnia Patient with complex respiratory medical history and on home O2 is currently stable appearing with mild increased work of breathing and wheezes/tight to lung exam. Offered breathing treatment in clinic, patient declined and states she'll complete this when she gets home. Oxygen saturation 96% on baseline home O2. She is currently taking prednisone dose pak prescribed by pulmonology and is requesting azithromycin since "this always clears it up". Chest x-ray shows chronic findings without evidence of acute cardiopulmonary etiology. She does not appear to be volume overloaded and I have low suspicion for CHF exacerbation. Advised azithromycin and continued use of breathing treatments/prednisone. Strict ER return precautions discussed.  2. Avulsion of skin of left forearm Wound cleansed and dressed in clinic with non-stick gauze and coban (she is allergic to tape). Tdap updated. Unable to close wound as this has been more than 24 hours. Strict infection return precautions discussed. Advised to continue cleansing and dressing wound BID at home until healed. PCP follow-up recommended.  Discussed physical exam and available lab work findings in clinic with patient.  Counseled patient regarding appropriate use of medications and potential side effects for all medications recommended or prescribed today. Discussed red flag signs and symptoms of worsening condition,when to call the PCP office, return to urgent care, and when to seek higher level of care in the emergency department. Patient verbalizes understanding and agreement with plan. All questions answered. Patient discharged in stable condition.    Final Clinical Impressions(s) / UC Diagnoses   Final diagnoses:  COPD exacerbation (HCC)  Chronic respiratory failure with hypoxia  and hypercapnia (HCC)  Avulsion of skin of left forearm, initial encounter     Discharge Instructions      Continue using your home DuoNebs, inhalers, and prednisone. Take azithromycin antibiotic as prescribed.  If your symptoms do not improve in the next 24 to 48 hours with use of interventions prescribed by me and your primary care provider, please go to the nearest emergency department for further evaluation. Return to urgent care as needed.     ED Prescriptions     Medication Sig Dispense Auth. Provider   azithromycin (ZITHROMAX) 250 MG tablet Take 1 tablet (250 mg total) by mouth daily. Take first 2 tablets  together, then 1 every day until finished. 6 tablet Carlisle Beers, FNP      PDMP not reviewed this encounter.   Carlisle Beers, [place] 09/10/22 2202

## 2022-09-08 NOTE — ED Notes (Signed)
Patient has noticed wheezing.  Patient does have bruise to right inner eye.

## 2022-09-08 NOTE — Discharge Instructions (Addendum)
Continue using your home DuoNebs, inhalers, and prednisone. Take azithromycin antibiotic as prescribed.  If your symptoms do not improve in the next 24 to 48 hours with use of interventions prescribed by me and your primary care provider, please go to the nearest emergency department for further evaluation. Return to urgent care as needed.

## 2022-09-09 NOTE — Telephone Encounter (Signed)
-----   Message from Sherrill Raring, [facility] sent at 09/09/2022  8:21 AM EDT ----- Regarding: 2300-Pharmacy Referral Hello,  Patient is eligible to meet with me through the Care Coordination program. PCP has approved.  Could a 2300-Pharmacy Referral be placed for medication management please?  Thank you! Sherrill Raring Clinical Pharmacist [phone]

## 2022-09-10 ENCOUNTER — Emergency Department (HOSPITAL_COMMUNITY): Payer: Medicare Other

## 2022-09-10 ENCOUNTER — Other Ambulatory Visit: Payer: Self-pay

## 2022-09-10 ENCOUNTER — Inpatient Hospital Stay (HOSPITAL_COMMUNITY)
Admission: EM | Admit: 2022-09-10 | Discharge: 2022-09-18 | DRG: 190 | Disposition: A | Discharge status: Home / Self Care | Payer: Medicare Other | Attending: Internal Medicine | Admitting: Internal Medicine

## 2022-09-10 ENCOUNTER — Encounter (HOSPITAL_COMMUNITY): Payer: Self-pay

## 2022-09-10 DIAGNOSIS — Z7952 Long term (current) use of systemic steroids: Secondary | ICD-10-CM

## 2022-09-10 DIAGNOSIS — Z8349 Family history of other endocrine, nutritional and metabolic diseases: Secondary | ICD-10-CM

## 2022-09-10 DIAGNOSIS — Z91040 Latex allergy status: Secondary | ICD-10-CM

## 2022-09-10 DIAGNOSIS — J9621 Acute and chronic respiratory failure with hypoxia: Secondary | ICD-10-CM | POA: Insufficient documentation

## 2022-09-10 DIAGNOSIS — E876 Hypokalemia: Secondary | ICD-10-CM | POA: Diagnosis present

## 2022-09-10 DIAGNOSIS — E782 Mixed hyperlipidemia: Secondary | ICD-10-CM

## 2022-09-10 DIAGNOSIS — D72829 Elevated white blood cell count, unspecified: Secondary | ICD-10-CM | POA: Diagnosis present

## 2022-09-10 DIAGNOSIS — I5022 Chronic systolic (congestive) heart failure: Secondary | ICD-10-CM | POA: Diagnosis present

## 2022-09-10 DIAGNOSIS — Z888 Allergy status to other drugs, medicaments and biological substances status: Secondary | ICD-10-CM

## 2022-09-10 DIAGNOSIS — Z820 Family history of epilepsy and other diseases of the nervous system: Secondary | ICD-10-CM

## 2022-09-10 DIAGNOSIS — I5181 Takotsubo syndrome: Secondary | ICD-10-CM | POA: Diagnosis present

## 2022-09-10 DIAGNOSIS — J441 Chronic obstructive pulmonary disease with (acute) exacerbation: Principal | ICD-10-CM | POA: Diagnosis present

## 2022-09-10 DIAGNOSIS — J9622 Acute and chronic respiratory failure with hypercapnia: Secondary | ICD-10-CM | POA: Diagnosis present

## 2022-09-10 DIAGNOSIS — Z7982 Long term (current) use of aspirin: Secondary | ICD-10-CM

## 2022-09-10 DIAGNOSIS — I5030 Unspecified diastolic (congestive) heart failure: Secondary | ICD-10-CM | POA: Diagnosis present

## 2022-09-10 DIAGNOSIS — Z8261 Family history of arthritis: Secondary | ICD-10-CM

## 2022-09-10 DIAGNOSIS — F419 Anxiety disorder, unspecified: Secondary | ICD-10-CM | POA: Diagnosis present

## 2022-09-10 DIAGNOSIS — Z7951 Long term (current) use of inhaled steroids: Secondary | ICD-10-CM

## 2022-09-10 DIAGNOSIS — Z823 Family history of stroke: Secondary | ICD-10-CM

## 2022-09-10 DIAGNOSIS — I5043 Acute on chronic combined systolic (congestive) and diastolic (congestive) heart failure: Secondary | ICD-10-CM | POA: Diagnosis not present

## 2022-09-10 DIAGNOSIS — I447 Left bundle-branch block, unspecified: Secondary | ICD-10-CM | POA: Diagnosis present

## 2022-09-10 DIAGNOSIS — I051 Rheumatic mitral insufficiency: Secondary | ICD-10-CM | POA: Diagnosis present

## 2022-09-10 DIAGNOSIS — Z880 Allergy status to penicillin: Secondary | ICD-10-CM

## 2022-09-10 DIAGNOSIS — N179 Acute kidney failure, unspecified: Secondary | ICD-10-CM | POA: Diagnosis not present

## 2022-09-10 DIAGNOSIS — N1831 Chronic kidney disease, stage 3a: Secondary | ICD-10-CM | POA: Diagnosis present

## 2022-09-10 DIAGNOSIS — I10 Essential (primary) hypertension: Secondary | ICD-10-CM | POA: Diagnosis present

## 2022-09-10 DIAGNOSIS — M1990 Unspecified osteoarthritis, unspecified site: Secondary | ICD-10-CM | POA: Diagnosis present

## 2022-09-10 DIAGNOSIS — I5042 Chronic combined systolic (congestive) and diastolic (congestive) heart failure: Secondary | ICD-10-CM | POA: Diagnosis present

## 2022-09-10 DIAGNOSIS — I110 Hypertensive heart disease with heart failure: Secondary | ICD-10-CM | POA: Diagnosis present

## 2022-09-10 DIAGNOSIS — M810 Age-related osteoporosis without current pathological fracture: Secondary | ICD-10-CM | POA: Diagnosis present

## 2022-09-10 DIAGNOSIS — F32A Depression, unspecified: Secondary | ICD-10-CM | POA: Diagnosis present

## 2022-09-10 DIAGNOSIS — Z87891 Personal history of nicotine dependence: Secondary | ICD-10-CM

## 2022-09-10 DIAGNOSIS — I2510 Atherosclerotic heart disease of native coronary artery without angina pectoris: Secondary | ICD-10-CM | POA: Diagnosis present

## 2022-09-10 DIAGNOSIS — Z885 Allergy status to narcotic agent status: Secondary | ICD-10-CM

## 2022-09-10 DIAGNOSIS — Z833 Family history of diabetes mellitus: Secondary | ICD-10-CM

## 2022-09-10 DIAGNOSIS — Z8249 Family history of ischemic heart disease and other diseases of the circulatory system: Secondary | ICD-10-CM

## 2022-09-10 DIAGNOSIS — J9601 Acute respiratory failure with hypoxia: Secondary | ICD-10-CM | POA: Diagnosis not present

## 2022-09-10 DIAGNOSIS — I959 Hypotension, unspecified: Secondary | ICD-10-CM | POA: Diagnosis not present

## 2022-09-10 DIAGNOSIS — E8729 Other acidosis: Secondary | ICD-10-CM | POA: Diagnosis present

## 2022-09-10 DIAGNOSIS — Z881 Allergy status to other antibiotic agents status: Secondary | ICD-10-CM

## 2022-09-10 DIAGNOSIS — I428 Other cardiomyopathies: Secondary | ICD-10-CM | POA: Diagnosis present

## 2022-09-10 DIAGNOSIS — I472 Ventricular tachycardia, unspecified: Secondary | ICD-10-CM | POA: Diagnosis present

## 2022-09-10 DIAGNOSIS — I21A1 Myocardial infarction type 2: Secondary | ICD-10-CM | POA: Diagnosis present

## 2022-09-10 DIAGNOSIS — N1832 Chronic kidney disease, stage 3b: Secondary | ICD-10-CM | POA: Diagnosis present

## 2022-09-10 DIAGNOSIS — Z79899 Other long term (current) drug therapy: Secondary | ICD-10-CM

## 2022-09-10 DIAGNOSIS — Z66 Do not resuscitate: Secondary | ICD-10-CM | POA: Diagnosis not present

## 2022-09-10 DIAGNOSIS — I5032 Chronic diastolic (congestive) heart failure: Secondary | ICD-10-CM | POA: Diagnosis present

## 2022-09-10 DIAGNOSIS — I5023 Acute on chronic systolic (congestive) heart failure: Secondary | ICD-10-CM | POA: Diagnosis present

## 2022-09-10 DIAGNOSIS — E785 Hyperlipidemia, unspecified: Secondary | ICD-10-CM | POA: Diagnosis present

## 2022-09-10 DIAGNOSIS — Z9049 Acquired absence of other specified parts of digestive tract: Secondary | ICD-10-CM

## 2022-09-10 DIAGNOSIS — Z1152 Encounter for screening for COVID-19: Secondary | ICD-10-CM

## 2022-09-10 LAB — RESPIRATORY PANEL BY PCR

## 2022-09-10 LAB — COMPREHENSIVE METABOLIC PANEL
ALT: 27 U/L (ref 0–44)
AST: 41 U/L (ref 15–41)
Albumin: 3.6 g/dL (ref 3.5–5.0)
Alkaline Phosphatase: 66 U/L (ref 38–126)
Anion gap: 14 (ref 5–15)
BUN: 33 mg/dL — ABNORMAL HIGH (ref 8–23)
CO2: 31 mmol/L (ref 22–32)
Calcium: 9.1 mg/dL (ref 8.9–10.3)
Chloride: 92 mmol/L — ABNORMAL LOW (ref 98–111)
Creatinine, Ser: 1.62 mg/dL — ABNORMAL HIGH (ref 0.44–1.00)
GFR, Estimated: 34 mL/min — ABNORMAL LOW (ref >60)
Glucose, Bld: 353 mg/dL — ABNORMAL HIGH (ref 70–99)
Potassium: 3.3 mmol/L — ABNORMAL LOW (ref 3.5–5.1)
Sodium: 137 mmol/L (ref 135–145)
Total Bilirubin: 0.6 mg/dL (ref 0.3–1.2)
Total Protein: 6.1 g/dL — ABNORMAL LOW (ref 6.5–8.1)

## 2022-09-10 LAB — I-STAT VENOUS BLOOD GAS, ED
Acid-Base Excess: 8 mmol/L — ABNORMAL HIGH (ref 0.0–2.0)
Bicarbonate: 37.7 mmol/L — ABNORMAL HIGH (ref 20.0–28.0)
Calcium, Ion: 1.11 mmol/L — ABNORMAL LOW (ref 1.15–1.40)
HCT: 39 % (ref 36.0–46.0)
Hemoglobin: 13.3 g/dL (ref 12.0–15.0)
O2 Saturation: 85 %
Potassium: 3.4 mmol/L — ABNORMAL LOW (ref 3.5–5.1)
Sodium: 137 mmol/L (ref 135–145)
TCO2: 40 mmol/L — ABNORMAL HIGH (ref 22–32)
pCO2, Ven: 78.3 mmHg (ref 44–60)
pH, Ven: 7.29 (ref 7.25–7.43)
pO2, Ven: 58 mmHg — ABNORMAL HIGH (ref 32–45)

## 2022-09-10 LAB — CBC WITH DIFFERENTIAL/PLATELET
Abs Immature Granulocytes: 0 10*3/uL (ref 0.00–0.07)
Basophils Absolute: 0.3 10*3/uL — ABNORMAL HIGH (ref 0.0–0.1)
Basophils Relative: 1 %
Eosinophils Absolute: 1.1 10*3/uL — ABNORMAL HIGH (ref 0.0–0.5)
Eosinophils Relative: 4 %
HCT: 41.8 % (ref 36.0–46.0)
Hemoglobin: 13 g/dL (ref 12.0–15.0)
Lymphocytes Relative: 37 %
Lymphs Abs: 9.8 10*3/uL — ABNORMAL HIGH (ref 0.7–4.0)
MCH: 32.2 pg (ref 26.0–34.0)
MCHC: 31.1 g/dL (ref 30.0–36.0)
MCV: 103.5 fL — ABNORMAL HIGH (ref 80.0–100.0)
Monocytes Absolute: 1.1 10*3/uL — ABNORMAL HIGH (ref 0.1–1.0)
Monocytes Relative: 4 %
Neutro Abs: 14.4 10*3/uL — ABNORMAL HIGH (ref 1.7–7.7)
Neutrophils Relative %: 54 %
Platelets: 301 10*3/uL (ref 150–400)
RBC: 4.04 MIL/uL (ref 3.87–5.11)
RDW: 13.8 % (ref 11.5–15.5)
WBC: 26.6 10*3/uL — ABNORMAL HIGH (ref 4.0–10.5)
nRBC: 0 /100 WBC
nRBC: 0.1 % (ref 0.0–0.2)

## 2022-09-10 LAB — TROPONIN I (HIGH SENSITIVITY): Troponin I (High Sensitivity): 67 ng/L — ABNORMAL HIGH (ref <18)

## 2022-09-10 LAB — RESP PANEL BY RT-PCR (FLU A&B, COVID) ARPGX2
Influenza A by PCR: NEGATIVE
Influenza B by PCR: NEGATIVE
SARS Coronavirus 2 by RT PCR: NEGATIVE

## 2022-09-10 LAB — MAGNESIUM: Magnesium: 3.3 mg/dL — ABNORMAL HIGH (ref 1.7–2.4)

## 2022-09-10 LAB — BRAIN NATRIURETIC PEPTIDE: B Natriuretic Peptide: 236.4 pg/mL — ABNORMAL HIGH (ref 0.0–100.0)

## 2022-09-10 LAB — HIV ANTIBODY (ROUTINE TESTING W REFLEX): HIV Screen 4th Generation wRfx: NONREACTIVE

## 2022-09-10 MED ORDER — EZETIMIBE 10 MG PO TABS: 10.0000 mg | ORAL_TABLET | Freq: Every day | ORAL | Status: DC

## 2022-09-10 MED ORDER — EZETIMIBE 10 MG PO TABS
10.0000 mg | ORAL_TABLET | Freq: Every day | ORAL | Status: DC
  Administered 2022-09-10 – 2022-09-18 (×9): 10 mg via ORAL
  Filled 2022-09-10 (×9): qty 1

## 2022-09-10 MED ORDER — SODIUM CHLORIDE 0.9 % IV SOLN: INTRAVENOUS | Status: AC

## 2022-09-10 MED ORDER — ALPRAZOLAM 0.5 MG PO TABS
0.5000 mg | ORAL_TABLET | Freq: Three times a day (TID) | ORAL | Status: DC | PRN
  Administered 2022-09-10 – 2022-09-12 (×6): 0.5 mg via ORAL
  Filled 2022-09-10 (×5): qty 1

## 2022-09-10 MED ORDER — ROSUVASTATIN CALCIUM 20 MG PO TABS: 20.0000 mg | ORAL_TABLET | Freq: Every day | ORAL | Status: DC

## 2022-09-10 MED ORDER — IPRATROPIUM BROMIDE 0.02 % IN SOLN
0.5000 mg | Freq: Four times a day (QID) | RESPIRATORY_TRACT | Status: DC
  Administered 2022-09-10 – 2022-09-11 (×4): 0.5 mg via RESPIRATORY_TRACT
  Filled 2022-09-10 (×4): qty 2.5

## 2022-09-10 MED ORDER — ENOXAPARIN SODIUM 40 MG/0.4ML IJ SOSY
40.0000 mg | PREFILLED_SYRINGE | INTRAMUSCULAR | Status: DC
  Filled 2022-09-10 (×3): qty 0.4

## 2022-09-10 MED ORDER — MOMETASONE FURO-FORMOTEROL FUM 200-5 MCG/ACT IN AERO
2.0000 | INHALATION_SPRAY | Freq: Two times a day (BID) | RESPIRATORY_TRACT | Status: DC
  Administered 2022-09-10 – 2022-09-11 (×2): 2 via RESPIRATORY_TRACT
  Filled 2022-09-10: qty 8.8

## 2022-09-10 MED ORDER — ROSUVASTATIN CALCIUM 20 MG PO TABS
20.0000 mg | ORAL_TABLET | Freq: Every day | ORAL | Status: DC
  Administered 2022-09-10 – 2022-09-18 (×9): 20 mg via ORAL
  Filled 2022-09-10 (×9): qty 1

## 2022-09-10 MED ORDER — POTASSIUM CHLORIDE 10 MEQ/100ML IV SOLN
10.0000 meq | INTRAVENOUS | Status: AC
  Administered 2022-09-10 (×2): 10 meq via INTRAVENOUS
  Filled 2022-09-10 (×2): qty 100

## 2022-09-10 MED ORDER — ALBUTEROL SULFATE HFA 108 (90 BASE) MCG/ACT IN AERS
1.0000 | INHALATION_SPRAY | RESPIRATORY_TRACT | Status: DC | PRN
  Administered 2022-09-10: 2 via RESPIRATORY_TRACT
  Filled 2022-09-10: qty 6.7

## 2022-09-10 MED ORDER — PAROXETINE HCL 20 MG PO TABS: 40.0000 mg | ORAL_TABLET | Freq: Every day | ORAL | Status: DC

## 2022-09-10 MED ORDER — SODIUM CHLORIDE 0.9% FLUSH
3.0000 mL | Freq: Two times a day (BID) | INTRAVENOUS | Status: DC
  Administered 2022-09-11 – 2022-09-18 (×15): 3 mL via INTRAVENOUS

## 2022-09-10 MED ORDER — ALBUTEROL SULFATE (2.5 MG/3ML) 0.083% IN NEBU: 15.0000 mg/h | INHALATION_SOLUTION | Freq: Once | RESPIRATORY_TRACT | Status: DC

## 2022-09-10 MED ORDER — ASPIRIN 81 MG PO TBEC
81.0000 mg | DELAYED_RELEASE_TABLET | Freq: Every day | ORAL | Status: DC
  Administered 2022-09-11 – 2022-09-18 (×8): 81 mg via ORAL
  Filled 2022-09-10 (×8): qty 1

## 2022-09-10 MED ORDER — ALBUTEROL SULFATE (2.5 MG/3ML) 0.083% IN NEBU
INHALATION_SOLUTION | RESPIRATORY_TRACT | Status: AC
  Administered 2022-09-10: 15 mg
  Filled 2022-09-10: qty 18

## 2022-09-10 MED ORDER — PREDNISONE 50 MG PO TABS
50.0000 mg | ORAL_TABLET | Freq: Every day | ORAL | Status: DC
  Administered 2022-09-11: 50 mg via ORAL
  Filled 2022-09-10: qty 2
  Filled 2022-09-10: qty 1

## 2022-09-10 MED ORDER — ALBUTEROL SULFATE (2.5 MG/3ML) 0.083% IN NEBU
2.5000 mg | INHALATION_SOLUTION | RESPIRATORY_TRACT | Status: DC | PRN
  Administered 2022-09-11 – 2022-09-12 (×3): 2.5 mg via RESPIRATORY_TRACT
  Filled 2022-09-10 (×5): qty 3

## 2022-09-10 MED ORDER — PAROXETINE HCL 20 MG PO TABS
40.0000 mg | ORAL_TABLET | Freq: Every day | ORAL | Status: DC
  Administered 2022-09-10 – 2022-09-18 (×9): 40 mg via ORAL
  Filled 2022-09-10 (×9): qty 2

## 2022-09-10 NOTE — ED Notes (Signed)
Per MD, STEMI cancelled

## 2022-09-10 NOTE — H&P (Addendum)
History and Physical   Kim PHENICIE [name]:[id number] DOB: 1955/07/19 DOA: 09/10/2022  PCP: Shirline Frees, NP   Patient coming from: Home  Chief Complaint: Shortness of breath  HPI: Kim [name] is a 67 y.o. female with medical history significant of nonobstructive CAD, hypertension, hyperlipidemia, CHF with recovered ejection fraction, stress-induced cardiomyopathy, subarachnoid hemorrhage, orthostatic hypotension, COPD, chronic respiratory failure with hypoxia, anxiety, depression presenting with shortness of breath.  Patient has known history of chronic respiratory failure with hypoxia on 2 to 3 L chronically.  Also on BiPAP/ventilator at night.  Patient has had increasing shortness of breath for the past 4 days.  She started taking increased dose of prednisone which she is prescribed to take as needed 4 days ago when her shortness of breath started.  Her shortness of breath has continued to gradually worsen and has associated cough with clear sputum.  She was seen in urgent care 2 days ago with a clear chest x-ray and was prescribed a Z-Pak (otherwise no note currently available for the visit.)   Today she woke up with severe shortness of breath and was unable to catch her breath.  History obtained with assistance of ED notes and EMS.  On EMS arrival fire department already placed patient on nonrebreather and she was saturating at 95%.  And route she received Atrovent x 3, albuterol x 3, magnesium, Solu-Medrol, placed on CPAP with some improvement.  Some concern for ST elevation on EMS EKG so arrived as a code STEMI but this was canceled by cardiology and EDP on patient arrival.  Patient has had some chest heaviness but no true chest pain.    She denies fevers, chills, abdominal pain, constipation, diarrhea, nausea, vomiting.  ED Course: Vital signs in the ED notable for temperature 95.9-99 (some readings that read lower were axillary while on BiPAP), heart rate in the 90s to 110s, blood  pressure in the 90s to 120 systolic, respiratory rate in the 20s to 30s, on BiPAP but saturating well and appears more comfortable per EDP.  Lab workup notable for CMP with potassium 3.3, chloride 92, BUN 33, creatinine elevated to 1.60 from baseline 1.1, glucose 353, protein 6.1.  CBC with leukocytosis to 26.6.  BNP elevated to 236.  Troponin mildly elevated at 67 with repeat pending.  Chest x-ray showing hyperinflation but no acute abnormality.  VBG with normal pH and elevated pCO2 at 78.3 representing compensated hypercarbia.  Review of Systems: As per HPI otherwise all other systems reviewed and are negative.  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   OSTEOARTHRITIS 06/05/2009   PARESTHESIA 08/24/2007   TOBACCO ABUSE 01/25/2010    Past Surgical History:  Procedure Laterality Date   ABDOMINAL HYSTERECTOMY     APPENDECTOMY  1969   CARDIAC CATHETERIZATION N/A 07/05/2015   Procedure: Left Heart Cath and Coronary Angiography;  Surgeon: Marykay Lex, MD;  Location: [facility] INVASIVE CV LAB;  Service: Cardiovascular;  Laterality: N/A;   CHOLECYSTECTOMY  1989   collapse lung  1990   COLONOSCOPY WITH PROPOFOL N/A 05/14/2018   Procedure: COLONOSCOPY WITH PROPOFOL;  Surgeon: Hilarie Fredrickson, MD;  Location: [facility];  Service: Endoscopy;  Laterality: N/A;   EXCISION MASS ABDOMINAL  2019   EXPLORATORY LAPAROTOMY  1969   LEFT HEART CATH AND CORONARY ANGIOGRAPHY N/A 11/19/2020   Procedure: LEFT HEART  CATH AND CORONARY ANGIOGRAPHY;  Surgeon: Yvonne Kendall, MD;  Location: [facility] INVASIVE CV LAB;  Service: Cardiovascular;  Laterality: N/A;   POLYPECTOMY  05/14/2018   Procedure: POLYPECTOMY;  Surgeon: Hilarie Fredrickson, MD;  Location: [facility];  Service: Endoscopy;;   SALPINGOOPHORECTOMY  2019    Social History  reports that she quit smoking about 8 years ago.  Her smoking use included cigarettes. She has a 76.00 pack-year smoking history. She has never used smokeless tobacco. She reports that she does not drink alcohol and does not use drugs.  Allergies  Allergen Reactions   Tape Other (See Comments)    Skin tears easily, peels skin off   Daliresp [Roflumilast]     Skin peeling   Dupixent [Dupilumab]     Cough, facial swelling, diarrhea, headache   Latex Other (See Comments)    Peels skin off   Lisinopril Cough   Morphine And Codeine     hallucinations   Penicillins Hives and Other (See Comments)    Tolerates Omnicef Has patient had a PCN reaction causing immediate rash, facial/tongue/throat swelling, SOB or lightheadedness with hypotension: Yes Has patient had a PCN reaction causing severe rash involving mucus membranes or skin necrosis: No Has patient had a PCN reaction that required hospitalization No Has patient had a PCN reaction occurring within the last 10 years: No If all of the above answers are "NO", then may proceed with Cephalosporin use.    Statins Other (See Comments)    Myalgia    Omnicef [Cefdinir] Rash    Family History  Problem Relation Age of Onset   Cancer Mother        lung   Heart attack Mother    Hypertension Sister    Multiple sclerosis Brother    Diabetes Sister    Rheum arthritis Sister    Thyroid disease Sister    Multiple sclerosis Other    Alcohol abuse Other    Arthritis Other    Diabetes Other    Kidney disease Other    Cancer Other        lung,ovarian,skin, uterine   Stroke Other    Heart disease Other    Melanoma Other    Osteoporosis Other    Heart attack Maternal Uncle    Heart attack Other        NEICE   Heart attack Other        NEPHEW   Hypertension Brother    Stroke Maternal Grandmother    Colon cancer Neg Hx   Reviewed on admission  Prior to Admission medications   Medication Sig Start Date End Date Taking? Authorizing Provider  acetaminophen (TYLENOL) 500 MG tablet Take  500 mg by mouth 2 (two) times daily as needed for headache.    [provider]  albuterol (VENTOLIN HFA) 108 (90 Base) MCG/ACT inhaler INHALE 2 PUFFS INTO THE LUNGS EVERY 4 TO 6 HOURS AS NEEDED FOR SHORTNESS OF BREATH OR WHEEZING 07/23/22   Cobb, Ruby Cola, NP  ALPRAZolam (XANAX) 0.5 MG tablet TAKE 1 TABLET(0.5 MG) BY MOUTH THREE TIMES DAILY AS NEEDED FOR ANXIETY 05/21/22   Nafziger, Kandee Keen, NP  aspirin EC 81 MG EC tablet Take 1 tablet (81 mg total) by mouth daily. 07/08/15   Rhetta Mura, MD  azithromycin (ZITHROMAX) 250 MG tablet Take 1 tablet (250 mg total) by mouth daily. Take first 2 tablets together, then 1 every day until finished. 09/08/22   Carlisle Beers, FNP  bisoprolol (ZEBETA) 5 MG  tablet Take 0.5 tablets (2.5 mg total) by mouth daily as needed (Take HALF tablet by mouth for break through chest pain as needed). 10/14/21   Marinus Maw, MD  budesonide-formoterol [facility]) 160-4.5 MCG/ACT inhaler Inhale 2 puffs into the lungs 2 (two) times daily. 07/15/22   Oretha Milch, MD  butalbital-acetaminophen-caffeine (FIORICET) [phone] MG tablet Take 1 tablet by mouth every 4 (four) hours as needed for headache. TAKE 1 TABLET BY MOUTH EVERY 4 HOURS AS NEEDED FOR HEADACHETAKE 1 TABLET BY MOUTH EVERY 4 HOURS AS NEEDED FOR HEADACHE 07/31/22   Nafziger, Kandee Keen, NP  cetirizine (ZYRTEC) 10 MG tablet Take 10 mg by mouth daily.    [provider]  Cholecalciferol (VITAMIN D3 PO) Take 5,000 Units by mouth daily.    [provider]  ezetimibe (ZETIA) 10 MG tablet TAKE 1 TABLET(10 MG) BY MOUTH DAILY 01/14/22   Nafziger, Kandee Keen, NP  furosemide (LASIX) 40 MG tablet Take 1 tablet (40 mg total) by mouth daily. 04/14/22   Kathleene Hazel, MD  HYDROcodone-acetaminophen (NORCO) 7.5-325 MG tablet Take 1 tablet by mouth every 6 (six) hours as needed for moderate pain. 08/05/22   Nafziger, Kandee Keen, NP  HYDROcodone-acetaminophen (NORCO) 7.5-325 MG tablet Take 1 tablet by mouth every 6  (six) hours as needed for moderate pain. 08/05/22   Nafziger, Kandee Keen, NP  HYDROcodone-acetaminophen (NORCO) 7.5-325 MG tablet Take 1 tablet by mouth every 6 (six) hours as needed for moderate pain. 08/05/22   Nafziger, Kandee Keen, NP  ipratropium-albuterol (DUONEB) 0.5-2.5 (3) MG/3ML SOLN USE 3 ML VIA NEBULIZER EVERY 6 HOURS AS NEEDED 05/16/22   Oretha Milch, MD  magnesium oxide (MAG-OX) 400 MG tablet Take 400 mg by mouth daily.    [provider]  MUCINEX MAXIMUM STRENGTH 1200 MG TB12 Take 1,200 mg by mouth every 12 (twelve) hours.    [provider]  nitroGLYCERIN (NITROSTAT) 0.4 MG SL tablet Place 1 tablet (0.4 mg total) under the tongue every 5 (five) minutes as needed for chest pain. 10/24/20   Kathleene Hazel, MD  ondansetron (ZOFRAN) 4 MG tablet Take 1 tablet (4 mg total) by mouth every 8 (eight) hours as needed for nausea or vomiting. 09/05/21   Nafziger, Kandee Keen, NP  OXYGEN Inhale 2 L/min into the lungs continuous.    [provider]  PARoxetine (PAXIL) 40 MG tablet TAKE 1 TABLET(40 MG) BY MOUTH EVERY MORNING 04/08/22   Nafziger, Kandee Keen, NP  potassium chloride (KLOR-CON) 10 MEQ tablet Take 1 tablet (10 mEq total) by mouth as needed (take with furosemide (lasix)). 06/05/22   Kathleene Hazel, MD  predniSONE (DELTASONE) 10 MG tablet TAKE 1 TABLET(10 MG) BY MOUTH DAILY 05/16/22   Oretha Milch, MD  predniSONE (DELTASONE) 5 MG tablet TAKE 1 TABLET(5 MG) BY MOUTH DAILY WITH BREAKFAST 09/08/22   Oretha Milch, MD  rosuvastatin (CRESTOR) 20 MG tablet Take 1 tablet (20 mg total) by mouth daily. 07/14/22   Kathleene Hazel, MD  Spacer/Aero Chamber Mouthpiece MISC 1 Device by Does not apply route as directed. 01/12/17   Roslynn Amble, MD    Physical Exam: Vitals:   09/10/22 1245 09/10/22 1300 09/10/22 1315 09/10/22 1325  BP: 112/80 100/76 109/72   Pulse: (!) 102 (!) 101 (!) 102 99  Resp: (!) 24 19 (!) 22 (!) 24  Temp:      SpO2: 100% 100% 100% 100%    Physical  Exam Constitutional:      General: She is not  in acute distress.    Appearance: Normal appearance.  HENT:     Head: Normocephalic and atraumatic.     Mouth/Throat:     Mouth: Mucous membranes are moist.     Pharynx: Oropharynx is clear.  Eyes:     Extraocular Movements: Extraocular movements intact.     Pupils: Pupils are equal, round, and reactive to light.  Cardiovascular:     Rate and Rhythm: Regular rhythm. Tachycardia present.     Pulses: Normal pulses.     Heart sounds: Normal heart sounds.  Pulmonary:     Effort: Pulmonary effort is normal. No respiratory distress.     Breath sounds: Decreased breath sounds and wheezing present.     Comments: Mildly increased work of breathing Abdominal:     General: Bowel sounds are normal. There is no distension.     Palpations: Abdomen is soft.     Tenderness: There is no abdominal tenderness.  Musculoskeletal:        General: No swelling or deformity.  Skin:    General: Skin is warm and dry.  Neurological:     General: No focal deficit present.     Mental Status: Mental status is at baseline.    Labs on Admission: I have personally reviewed following labs and imaging studies  CBC: Recent Labs  Lab 09/10/22 1101 09/10/22 1106  WBC 26.6*  --   NEUTROABS 14.4*  --   HGB 13.0 13.3  HCT 41.8 39.0  MCV 103.5*  --   PLT 301  --     Basic Metabolic Panel: Recent Labs  Lab 09/10/22 1101 09/10/22 1106  NA 137 137  K 3.3* 3.4*  CL 92*  --   CO2 31  --   GLUCOSE 353*  --   BUN 33*  --   CREATININE 1.62*  --   CALCIUM 9.1  --     GFR: CrCl cannot be calculated (Unknown ideal weight.).  Liver Function Tests: Recent Labs  Lab 09/10/22 1101  AST 41  ALT 27  ALKPHOS 66  BILITOT 0.6  PROT 6.1*  ALBUMIN 3.6    Urine analysis:    Component Value Date/Time   COLORURINE YELLOW 08/26/2021 0805   APPEARANCEUR CLEAR 08/26/2021 0805   LABSPEC 1.021 08/26/2021 0805   PHURINE 5.0 08/26/2021 0805   GLUCOSEU NEGATIVE  08/26/2021 0805   HGBUR NEGATIVE 08/26/2021 0805   HGBUR trace-lysed 02/26/2010 0951   BILIRUBINUR NEGATIVE 08/26/2021 0805   BILIRUBINUR negative 04/29/2021 1243   KETONESUR NEGATIVE 08/26/2021 0805   PROTEINUR NEGATIVE 08/26/2021 0805   UROBILINOGEN 0.2 04/29/2021 1243   UROBILINOGEN 0.2 02/26/2010 0951   NITRITE NEGATIVE 08/26/2021 0805   LEUKOCYTESUR NEGATIVE 08/26/2021 0805    Radiological Exams on Admission: DG Chest Port 1 View  Result Date: 09/10/2022 CLINICAL DATA:  Shortness of breath EXAM: PORTABLE CHEST 1 VIEW COMPARISON:  X-ray 09/08/2022 FINDINGS: Hyperinflation. Likely chronic interstitial lung changes. No consolidation, pneumothorax or effusion. No edema. Normal cardiopericardial silhouette with a calcified aorta. Overlapping cardiac leads. IMPRESSION: No acute cardiopulmonary disease. Hyperinflation with chronic changes Electronically Signed   By: Karen Kays M.D.   On: 09/10/2022 11:20    EKG: Independently reviewed.  Sinus tachycardia at 110 bpm.  Intraventricular conduction delay with QRS of 131 with appearance of left bundle branch block.  Baseline wander, baseline artifact.  J-point elevation with occasional appearance of ST elevation given baseline wander.  Bundle branch block does appear new, patient has been cleared by cardiology  after coming in as an ?STEMI.  Assessment/Plan Principal Problem:   Acute on chronic respiratory failure with hypoxia and hypercapnia (HCC) Active Problems:   COPD with acute exacerbation (HCC)   Chronic combined systolic (congestive) and diastolic (congestive) heart failure (HCC)   Anxiety and depression   Essential hypertension   AKI (acute kidney injury) (HCC)   Dyslipidemia   Coronary artery calcification seen on CAT scan   HLD (hyperlipidemia)   Heart failure with preserved ejection fraction (HCC)   Acute on chronic respiratory failure with hypoxia and hypercarbia apnea COPD exacerbation > Patient presenting with 4 days of  worsening shortness of breath as per HPI.  Not improving despite increased prednisone at home and Z-Pak. > Severe shortness of breath today received multiple Atrovent and albuterol nebs and route by EMS as well as magnesium and Solu-Medrol. > In the ED given additional albuterol and transition from CPAP to BiPAP with some improvement in work of breathing and comfort. > Wheezy and tight on exam, no rales.  Suspect COPD exacerbation as primary driver. > Does have leukocytosis but in the setting of steroid use and significant respiratory distress suspect reactive etiology. > Will check full respiratory viral panel and COVID screen to evaluate for possible etiology. > Is on chronic steroids, alternating 5 mg and 10 mg every other day.  Also is on BiPAP/NIV at night. - Monitor on progressive unit - Continue BiPAP as needed and nightly, wean as tolerated during the day - Wean supplemental oxygen as tolerated - Continue with daily steroids, due to chronic steroid use will need a prolonged taper back to baseline dose on discharge - Scheduled Atrovent - As needed albuterol - Follow-up RVP and COVID screen. - Replace home Symbicort with formulary Dulera  AKI Hypokalemia > Creatinine noted to be elevated to 1.62 from baseline 1.1.  Potassium noted to be 3.3.  Did receive magnesium and route. > No evidence of volume overload on exam.  BNP is elevated as below with history of CHF so we will hydrate continuously instead of with bolus. 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  Most recent echo with EF 60-65%, normal diastolic function, normal RV function.  Had LVEF as low as 15 to 20% in 2017 but as above this has recovered.  Nonobstructive CAD as below. > Does have mildly elevated BNP to 236 and troponin of 67.  Suspect strain in the setting of above. - Holding home Lasix in  the setting of AKI - I's and O's, daily weights - Consider repeat echo if continued significant symptoms despite respiratory interventions for COPD  CAD > Nonobstructive CAD confirmed on cath in 2022. > Some concern for possible STEMI and route, this was canceled by cardiology and EDP on patient arrival.  Does appear to have new left bundle branch block, will trend troponin. - Trend troponin - Continue home aspirin - Continue home Zetia and rosuvastatin  Hypertension - Holding home Lasix in the setting of AKI as above  Hyperlipidemia - Continue home Zetia and rosuvastatin  Anxiety Depression - Continue home Paxil - Continue home Xanax  DVT prophylaxis: Lovenox Code Status:   DNR, okay with intubation trial but would not want to be on long-term life support. Family Communication:  Discussed with family at bedside Disposition Plan:   Patient is from:  Home  Anticipated DC to:  Home  Anticipated DC  date:  1 to 4 days  Anticipated DC barriers: None  Consults called:  None (was cleared by cardiology of STEMI on arrival) Admission status:  Observation, progressive  Severity of Illness: The appropriate patient status for this patient is OBSERVATION. Observation status is judged to be reasonable and necessary in order to provide the required intensity of service to ensure the patient's safety. The patient's presenting symptoms, physical exam findings, and initial radiographic and laboratory data in the context of their medical condition is felt to place them at decreased risk for further clinical deterioration. Furthermore, it is anticipated that the patient will be medically stable for discharge from the hospital within 2 midnights of admission.    Synetta Fail MD [facility]  How to contact the [facility] Attending or Consulting provider 7A - 7P or covering provider during after hours 7P -7A, for this patient?   Check the care team in [facility] and look for a) attending/consulting  [facility] provider listed and b) the [facility] team listed Log into [email or web address] and use [place]'s universal password to access. If you do not have the password, please contact the hospital operator. Locate the [facility] provider you are looking for under [facility] and page to a number that you can be directly reached. If you still have difficulty reaching the provider, please page the [facility] (Director on Call) for the Hospitalists listed on amion for assistance.  09/10/2022, 1:30 PM

## 2022-09-10 NOTE — Progress Notes (Signed)
   09/10/22 1514  Assess: MEWS Score  Temp 97.9 F (36.6 C)  BP 105/73  MAP (mmHg) 83  Pulse Rate 99  ECG Heart Rate 100  Resp (!) 26  SpO2 95 %  O2 Device Nasal Cannula  O2 Flow Rate (L/min) 3 L/min  Assess: MEWS Score  MEWS Temp 0  MEWS Systolic 0  MEWS Pulse 0  MEWS RR 2  MEWS LOC 0  MEWS Score 2  MEWS Score Color Yellow  Assess: if the MEWS score is Yellow or Red  Were vital signs taken at a resting state? Yes  Focused Assessment No change from prior assessment  Does the patient meet 2 or more of the SIRS criteria? Yes  Does the patient have a confirmed or suspected source of infection? No  MEWS guidelines implemented  Yes, yellow  Treat  MEWS Interventions Considered administering scheduled or prn medications/treatments as ordered  Take Vital Signs  Increase Vital Sign Frequency  Yellow: Q2hr x1, continue Q4hrs until patient remains green for 12hrs  Escalate  MEWS: Escalate Yellow: Discuss with charge nurse and consider notifying provider and/or RRT  Notify: Charge Nurse/RN  Name of Charge Nurse/RN Notified Darl Pikes, RN  Assess: SIRS CRITERIA  SIRS Temperature  0  SIRS Pulse 1  SIRS Respirations  1  SIRS WBC 1  SIRS Score Sum  3   Yellow MEWS d/t RR of 26, patient recently taken off of bipap before being sent  to the floor, currently on 3L [place]. Notified Holly, RT and requested she come assess patient. Patient c/o dyspnea with exertion but is not in distress and able to speak in full sentences.

## 2022-09-10 NOTE — ED Notes (Signed)
ED TO INPATIENT HANDOFF REPORT  ED Nurse Name and Phone #: Alivea Gladson [phone]  S Name/Age/Gender Kim [name] 67 y.o. female Room/Bed: TRABC/TRABC  Code Status   Code Status: Full Code  Home/SNF/Other Home Patient oriented to: self, place, time, and situation Is this baseline? Yes   Triage Complete: Triage complete  Chief Complaint Acute on chronic respiratory failure with hypoxia [facility]) [J96.21]  Triage Note Pt came in by EMS due to [facility] and code stemi. Pt having chest pressure and arrives on cpap. Axox4.  Ems gave  I'm epi  125 solu-medrol  3 duonebs 2g of mag    Allergies Allergies  Allergen Reactions   Tape Other (See Comments)    Skin tears easily, peels skin off   Daliresp [Roflumilast]     Skin peeling   Dupixent [Dupilumab]     Cough, facial swelling, diarrhea, headache   Latex Other (See Comments)    Peels skin off   Lisinopril Cough   Morphine And Codeine     hallucinations   Penicillins Hives and Other (See Comments)    Tolerates Omnicef Has patient had a PCN reaction causing immediate rash, facial/tongue/throat swelling, SOB or lightheadedness with hypotension: Yes Has patient had a PCN reaction causing severe rash involving mucus membranes or skin necrosis: No Has patient had a PCN reaction that required hospitalization No Has patient had a PCN reaction occurring within the last 10 years: No If all of the above answers are "NO", then may proceed with Cephalosporin use.    Statins Other (See Comments)    Myalgia    Omnicef [Cefdinir] Rash    Level of Care/Admitting Diagnosis ED Disposition     ED Disposition  Admit   Condition  --   Comment  Hospital Area: [facility] [[id number]]  Level of Care: Progressive [102]  Admit to Progressive based on following criteria: RESPIRATORY PROBLEMS hypoxemic/hypercapnic respiratory failure that is responsive to NIPPV (BiPAP) or High Flow Nasal Cannula (6-80 lpm). Frequent  assessment/intervention, no > Q2 hrs < Q4 hrs, to maintain oxygenation and pulmonary hygiene.  May place patient in observation at [facility] or [facility] if equivalent level of care is available:: No  Covid Evaluation: Symptomatic Person Under Investigation (PUI) or recent exposure (last 10 days) *Testing Required*  Diagnosis: Acute on chronic respiratory failure with hypoxia [facility]) [[id number]]  Admitting Physician: Synetta Fail [[id number]]  Attending Physician: Synetta Fail [[id number]]          B Medical/Surgery History 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   OSTEOARTHRITIS 06/05/2009   PARESTHESIA 08/24/2007   TOBACCO ABUSE 01/25/2010   Past Surgical History:  Procedure Laterality Date   ABDOMINAL HYSTERECTOMY     APPENDECTOMY  1969   CARDIAC CATHETERIZATION N/A 07/05/2015   Procedure: Left Heart Cath and Coronary Angiography;  Surgeon: Marykay Lex, MD;  Location: [facility] INVASIVE CV LAB;  Service: Cardiovascular;  Laterality: N/A;   CHOLECYSTECTOMY  1989   collapse lung  1990   COLONOSCOPY WITH PROPOFOL N/A 05/14/2018   Procedure: COLONOSCOPY WITH PROPOFOL;  Surgeon: Hilarie Fredrickson, MD;  Location: [facility] ENDOSCOPY;  Service: Endoscopy;  Laterality: N/A;   EXCISION MASS ABDOMINAL  2019   EXPLORATORY LAPAROTOMY  1969   LEFT HEART CATH AND CORONARY ANGIOGRAPHY N/A 11/19/2020   Procedure: LEFT HEART CATH AND  CORONARY ANGIOGRAPHY;  Surgeon: Yvonne Kendall, MD;  Location: [facility] INVASIVE CV LAB;  Service: Cardiovascular;  Laterality: N/A;   POLYPECTOMY  05/14/2018   Procedure: POLYPECTOMY;  Surgeon: Hilarie Fredrickson, MD;  Location: [facility] ENDOSCOPY;  Service: Endoscopy;;   SALPINGOOPHORECTOMY  2019     A IV Location/Drains/Wounds Patient Lines/Drains/Airways Status     Active Line/Drains/Airways     Name Placement date Placement time  Site Days   Peripheral IV 09/10/22 20 G Anterior;Proximal;Right Forearm 09/10/22  1050  Forearm  less than 1   Peripheral IV 09/10/22 20 G Anterior;Left Hand 09/10/22  1050  Hand  less than 1            Intake/Output Last 24 hours No intake or output data in the 24 hours ending 09/10/22 1308  Labs/Imaging Results for orders placed or performed during the hospital encounter of 09/10/22 (from the past 48 hour(s))  CBC with Differential/Platelet     Status: Abnormal   Collection Time: 09/10/22 11:01 AM  Result Value Ref Range   WBC 26.6 (H) 4.0 - 10.5 K/uL   RBC 4.04 3.87 - 5.11 MIL/uL   Hemoglobin 13.0 12.0 - 15.0 g/dL   HCT 16.1 09.6 - 04.5 %   MCV 103.5 (H) 80.0 - 100.0 fL   MCH 32.2 26.0 - 34.0 pg   MCHC 31.1 30.0 - 36.0 g/dL   RDW 40.9 81.1 - 91.4 %   Platelets 301 150 - 400 K/uL   nRBC 0.1 0.0 - 0.2 %   Neutrophils Relative % 54 %   Neutro Abs 14.4 (H) 1.7 - 7.7 K/uL   Lymphocytes Relative 37 %   Lymphs Abs 9.8 (H) 0.7 - 4.0 K/uL   Monocytes Relative 4 %   Monocytes Absolute 1.1 (H) 0.1 - 1.0 K/uL   Eosinophils Relative 4 %   Eosinophils Absolute 1.1 (H) 0.0 - 0.5 K/uL   Basophils Relative 1 %   Basophils Absolute 0.3 (H) 0.0 - 0.1 K/uL   nRBC 0 0 /100 WBC   Abs Immature Granulocytes 0.00 0.00 - 0.07 K/uL   Polychromasia PRESENT     Comment: Performed at [facility], [address], [place], [place] [address]  Brain natriuretic peptide     Status: Abnormal   Collection Time: 09/10/22 11:01 AM  Result Value Ref Range   B Natriuretic Peptide 236.4 (H) 0.0 - 100.0 pg/mL    Comment: Performed at [facility], [address], [place], [place] [address]  Comprehensive metabolic panel     Status: Abnormal   Collection Time: 09/10/22 11:01 AM  Result Value Ref Range   Sodium 137 135 - 145 mmol/L   Potassium 3.3 (L) 3.5 - 5.1 mmol/L   Chloride 92 (L) 98 - 111 mmol/L   CO2 31 22 - 32 mmol/L   Glucose, Bld 353 (H) 70 - 99 mg/dL    Comment: Glucose reference  range applies only to samples taken after fasting for at least 8 hours.   BUN 33 (H) 8 - 23 mg/dL   Creatinine, Ser 8.65 (H) 0.44 - 1.00 mg/dL   Calcium 9.1 8.9 - 78.4 mg/dL   Total Protein 6.1 (L) 6.5 - 8.1 g/dL   Albumin 3.6 3.5 - 5.0 g/dL   AST 41 15 - 41 U/L   ALT 27 0 - 44 U/L   Alkaline Phosphatase 66 38 - 126 U/L   Total Bilirubin 0.6 0.3 - 1.2 mg/dL   GFR, Estimated 34 (  L) >60 mL/min    Comment: (NOTE) Calculated using the CKD-EPI Creatinine Equation (2021)    Anion gap 14 5 - 15    Comment: Performed at [facility], [address], [place], [place] [address]  Troponin I (High Sensitivity)     Status: Abnormal   Collection Time: 09/10/22 11:01 AM  Result Value Ref Range   Troponin I (High Sensitivity) 67 (H) <18 ng/L    Comment: (NOTE) Elevated high sensitivity troponin I (hsTnI) values and significant  changes across serial measurements may suggest ACS but many other  chronic and acute conditions are known to elevate hsTnI results.  Refer to the "Links" section for chest pain algorithms and additional  guidance. Performed at [facility], [address] [place], [place] [address]   I-Stat venous blood gas, ED     Status: Abnormal   Collection Time: 09/10/22 11:06 AM  Result Value Ref Range   pH, Ven 7.290 7.25 - 7.43   pCO2, Ven 78.3 (HH) 44 - 60 mmHg   pO2, Ven 58 (H) 32 - 45 mmHg   Bicarbonate 37.7 (H) 20.0 - 28.0 mmol/L   TCO2 40 (H) 22 - 32 mmol/L   O2 Saturation 85 %   Acid-Base Excess 8.0 (H) 0.0 - 2.0 mmol/L   Sodium 137 135 - 145 mmol/L   Potassium 3.4 (L) 3.5 - 5.1 mmol/L   Calcium, Ion 1.11 (L) 1.15 - 1.40 mmol/L   HCT 39.0 36.0 - 46.0 %   Hemoglobin 13.3 12.0 - 15.0 g/dL   Sample type VENOUS    Comment NOTIFIED PHYSICIAN    DG Chest Port 1 View  Result Date: 09/10/2022 CLINICAL DATA:  Shortness of breath EXAM: PORTABLE CHEST 1 VIEW COMPARISON:  X-ray 09/08/2022 FINDINGS: Hyperinflation. Likely chronic interstitial lung changes. No  consolidation, pneumothorax or effusion. No edema. Normal cardiopericardial silhouette with a calcified aorta. Overlapping cardiac leads. IMPRESSION: No acute cardiopulmonary disease. Hyperinflation with chronic changes Electronically Signed   By: Karen Kays M.D.   On: 09/10/2022 11:20    Pending Labs Unresulted Labs (From admission, onward)     Start     Ordered   09/17/22 0500  Creatinine, serum  (enoxaparin (LOVENOX)    CrCl >/= 30 ml/min)  Weekly,   R     Comments: while on enoxaparin therapy    09/10/22 1246   09/11/22 0500  Comprehensive metabolic panel  Tomorrow morning,   R        09/10/22 1246   09/11/22 0500  CBC  Tomorrow morning,   R        09/10/22 1246   09/10/22 1245  Magnesium  Add-on,   AD        09/10/22 1246   09/10/22 1242  SARS Coronavirus 2 by RT PCR (hospital order, performed in [facility] hospital lab) *cepheid single result test* Anterior Nasal Swab  (Tier 2 - SARS Coronavirus 2 by RT PCR (hospital order, performed in [facility] hospital lab) *cepheid single result test*)  Once,   R        09/10/22 1246   09/10/22 1241  HIV Antibody (routine testing w rflx)  (HIV Antibody (Routine testing w reflex) panel)  Once,   R        09/10/22 1246   09/10/22 1241  Respiratory (~20 pathogens) panel by PCR  (COPD / Pneumonia / Cellulitis / Lower Extremity Wound)  Once,   R  09/10/22 1246            Vitals/Pain Today's Vitals   09/10/22 1200 09/10/22 1205 09/10/22 1215 09/10/22 1245  BP: 111/70  106/80 112/80  Pulse: 98  (!) 101 (!) 102  Resp: (!) 28  18 (!) 24  Temp:      SpO2: 100% 100% 100% 100%  PainSc:        Isolation Precautions Airborne and Contact precautions  Medications Medications  albuterol (PROVENTIL) (2.5 MG/3ML) 0.083% nebulizer solution (15 mg/hr Nebulization Not Given 09/10/22 1100)  aspirin EC tablet 81 mg (has no administration in time range)  rosuvastatin (CRESTOR) tablet 20 mg (has no administration in time range)  ezetimibe  (ZETIA) tablet 10 mg (has no administration in time range)  PARoxetine (PAXIL) tablet 40 mg (has no administration in time range)  ALPRAZolam (XANAX) tablet 0.5 mg (has no administration in time range)  mometasone-formoterol (DULERA) 200-5 MCG/ACT inhaler 2 puff (has no administration in time range)  predniSONE (DELTASONE) tablet 50 mg (has no administration in time range)  ipratropium (ATROVENT) nebulizer solution 0.5 mg (has no administration in time range)  albuterol (PROVENTIL) (2.5 MG/3ML) 0.083% nebulizer solution 2.5 mg (has no administration in time range)  enoxaparin (LOVENOX) injection 40 mg (has no administration in time range)  sodium chloride flush (NS) 0.9 % injection 3 mL (has no administration in time range)  0.9 %  sodium chloride infusion (has no administration in time range)  albuterol (PROVENTIL) (2.5 MG/3ML) 0.083% nebulizer solution (15 mg  Given 09/10/22 1059)    Mobility walks     Focused Assessments Pulmonary Assessment Handoff:  Lung sounds: Bilateral Breath Sounds: Diminished, Expiratory wheezes O2 Device: Bi-PAP      R Recommendations: See Admitting Provider Note  Report given to:   Additional Notes: A & O x 4

## 2022-09-10 NOTE — ED Triage Notes (Signed)
Pt came in by EMS due to [facility] and code stemi. Pt having chest pressure and arrives on cpap. Axox4.  Ems gave  I'm epi  125 solu-medrol  3 duonebs 2g of mag

## 2022-09-10 NOTE — ED Provider Notes (Signed)
[facility] AT [facility] Provider Note   CSN: [id number] Arrival date & time: 09/10/22  1039     History  Chief Complaint  Patient presents with   Code STEMI    Kim [name] is a 67 y.o. female.  Patient is a 67 year old female with a history of prior tobacco use, COPD who is presenting today with shortness of breath and respiratory distress.  EMS gives the history reports patient started having shortness of breath on Saturday which has been gradually worsening with a cough with clear sputum.  Today shortness of breath became severe and she could not catch her breath.  When EMS arrived patient was already on a nonrebreather with sats at 95%.  Patient reports she does wear oxygen at home.  She is having heaviness in her chest but denies any frank pain.  She is having occasional cramps in her stomach but denies any nausea or vomiting.  No fevers.  No leg swelling.  Patient had a clean catheterization 2 years ago but and route EMS gave 3 albuterol and Atrovent treatments, started patient on CPAP, magnesium and Solu-Medrol.  Patient reports she is feeling better but out of hospital EKG they were concerned for ST elevation anteriorly and activated a code STEMI.  The history is provided by the patient, the EMS personnel and medical records.       Home Medications Prior to Admission medications   Medication Sig Start Date End Date Taking? Authorizing Provider  acetaminophen (TYLENOL) 500 MG tablet Take 500 mg by mouth 2 (two) times daily as needed for headache.    [provider]  albuterol (VENTOLIN HFA) 108 (90 Base) MCG/ACT inhaler INHALE 2 PUFFS INTO THE LUNGS EVERY 4 TO 6 HOURS AS NEEDED FOR SHORTNESS OF BREATH OR WHEEZING 07/23/22   Cobb, Ruby Cola, NP  ALPRAZolam (XANAX) 0.5 MG tablet TAKE 1 TABLET(0.5 MG) BY MOUTH THREE TIMES DAILY AS NEEDED FOR ANXIETY 05/21/22   Nafziger, Kandee Keen, NP  aspirin EC 81 MG EC tablet Take 1 tablet (81 mg total) by  mouth daily. 07/08/15   Rhetta Mura, MD  azithromycin (ZITHROMAX) 250 MG tablet Take 1 tablet (250 mg total) by mouth daily. Take first 2 tablets together, then 1 every day until finished. 09/08/22   Carlisle Beers, FNP  bisoprolol (ZEBETA) 5 MG tablet Take 0.5 tablets (2.5 mg total) by mouth daily as needed (Take HALF tablet by mouth for break through chest pain as needed). 10/14/21   Marinus Maw, MD  budesonide-formoterol [facility]) 160-4.5 MCG/ACT inhaler Inhale 2 puffs into the lungs 2 (two) times daily. 07/15/22   Oretha Milch, MD  butalbital-acetaminophen-caffeine (FIORICET) [phone] MG tablet Take 1 tablet by mouth every 4 (four) hours as needed for headache. TAKE 1 TABLET BY MOUTH EVERY 4 HOURS AS NEEDED FOR HEADACHETAKE 1 TABLET BY MOUTH EVERY 4 HOURS AS NEEDED FOR HEADACHE 07/31/22   Nafziger, Kandee Keen, NP  cetirizine (ZYRTEC) 10 MG tablet Take 10 mg by mouth daily.    [provider]  Cholecalciferol (VITAMIN D3 PO) Take 5,000 Units by mouth daily.    [provider]  ezetimibe (ZETIA) 10 MG tablet TAKE 1 TABLET(10 MG) BY MOUTH DAILY 01/14/22   Nafziger, Kandee Keen, NP  furosemide (LASIX) 40 MG tablet Take 1 tablet (40 mg total) by mouth daily. 04/14/22   Kathleene Hazel, MD  HYDROcodone-acetaminophen (NORCO) 7.5-325 MG tablet Take 1 tablet by mouth every 6 (six) hours as needed for moderate  pain. 08/05/22   Nafziger, Kandee Keen, NP  HYDROcodone-acetaminophen (NORCO) 7.5-325 MG tablet Take 1 tablet by mouth every 6 (six) hours as needed for moderate pain. 08/05/22   Nafziger, Kandee Keen, NP  HYDROcodone-acetaminophen (NORCO) 7.5-325 MG tablet Take 1 tablet by mouth every 6 (six) hours as needed for moderate pain. 08/05/22   Nafziger, Kandee Keen, NP  ipratropium-albuterol (DUONEB) 0.5-2.5 (3) MG/3ML SOLN USE 3 ML VIA NEBULIZER EVERY 6 HOURS AS NEEDED 05/16/22   Oretha Milch, MD  magnesium oxide (MAG-OX) 400 MG tablet Take 400 mg by mouth daily.    [provider]  MUCINEX  MAXIMUM STRENGTH 1200 MG TB12 Take 1,200 mg by mouth every 12 (twelve) hours.    [provider]  nitroGLYCERIN (NITROSTAT) 0.4 MG SL tablet Place 1 tablet (0.4 mg total) under the tongue every 5 (five) minutes as needed for chest pain. 10/24/20   Kathleene Hazel, MD  ondansetron (ZOFRAN) 4 MG tablet Take 1 tablet (4 mg total) by mouth every 8 (eight) hours as needed for nausea or vomiting. 09/05/21   Nafziger, Kandee Keen, NP  OXYGEN Inhale 2 L/min into the lungs continuous.    [provider]  PARoxetine (PAXIL) 40 MG tablet TAKE 1 TABLET(40 MG) BY MOUTH EVERY MORNING 04/08/22   Nafziger, Kandee Keen, NP  potassium chloride (KLOR-CON) 10 MEQ tablet Take 1 tablet (10 mEq total) by mouth as needed (take with furosemide (lasix)). 06/05/22   Kathleene Hazel, MD  predniSONE (DELTASONE) 10 MG tablet TAKE 1 TABLET(10 MG) BY MOUTH DAILY 05/16/22   Oretha Milch, MD  predniSONE (DELTASONE) 5 MG tablet TAKE 1 TABLET(5 MG) BY MOUTH DAILY WITH BREAKFAST 09/08/22   Oretha Milch, MD  rosuvastatin (CRESTOR) 20 MG tablet Take 1 tablet (20 mg total) by mouth daily. 07/14/22   Kathleene Hazel, MD  Spacer/Aero Chamber Mouthpiece MISC 1 Device by Does not apply route as directed. 01/12/17   Roslynn Amble, MD      Allergies    Tape, Daliresp [roflumilast], Dupixent [dupilumab], Latex, Lisinopril, Morphine and codeine, Penicillins, Statins, and Omnicef [cefdinir]    Review of Systems   Review of Systems  Physical Exam Updated Vital Signs BP 114/80   Pulse 96   Temp (!) 95.9 F (35.5 C)   Resp (!) 26   SpO2 100%  Physical Exam Vitals and nursing note reviewed.  Constitutional:      General: She is in acute distress.     Appearance: She is well-developed. She is ill-appearing and diaphoretic.  HENT:     Head: Normocephalic and atraumatic.  Eyes:     Pupils: Pupils are equal, round, and reactive to light.  Cardiovascular:     Rate and Rhythm: Regular rhythm. Tachycardia present.      Heart sounds: Normal heart sounds. No murmur heard.    No friction rub.  Pulmonary:     Effort: Tachypnea and accessory muscle usage present.     Breath sounds: Decreased air movement present. Decreased breath sounds and wheezing present. No rales.     Comments: Scant wheezing and significantly diminished airways bilaterally Abdominal:     General: Bowel sounds are normal. There is no distension.     Palpations: Abdomen is soft.     Tenderness: There is no abdominal tenderness. There is no guarding or rebound.  Musculoskeletal:        General: No tenderness. Normal range of motion.     Right lower leg: No edema.  Left lower leg: No edema.     Comments: No edema  Skin:    General: Skin is warm.     Findings: No rash.  Neurological:     Mental Status: She is alert and oriented to person, place, and time. Mental status is at baseline.     Cranial Nerves: No cranial nerve deficit.  Psychiatric:        Mood and Affect: Mood normal.        Behavior: Behavior normal.     ED Results / Procedures / Treatments   Labs (all labs ordered are listed, but only abnormal results are displayed) Labs Reviewed  CBC WITH DIFFERENTIAL/PLATELET - Abnormal; Notable for the following components:      Result Value   WBC 26.6 (*)    MCV 103.5 (*)    Neutro Abs 14.4 (*)    Lymphs Abs 9.8 (*)    Monocytes Absolute 1.1 (*)    Eosinophils Absolute 1.1 (*)    Basophils Absolute 0.3 (*)    All other components within normal limits  BRAIN NATRIURETIC PEPTIDE - Abnormal; Notable for the following components:   B Natriuretic Peptide 236.4 (*)    All other components within normal limits  COMPREHENSIVE METABOLIC PANEL - Abnormal; Notable for the following components:   Potassium 3.3 (*)    Chloride 92 (*)    Glucose, Bld 353 (*)    BUN 33 (*)    Creatinine, Ser 1.62 (*)    Total Protein 6.1 (*)    GFR, Estimated 34 (*)    All other components within normal limits  I-STAT VENOUS BLOOD GAS, ED  - Abnormal; Notable for the following components:   pCO2, Ven 78.3 (*)    pO2, Ven 58 (*)    Bicarbonate 37.7 (*)    TCO2 40 (*)    Acid-Base Excess 8.0 (*)    Potassium 3.4 (*)    Calcium, Ion 1.11 (*)    All other components within normal limits  TROPONIN I (HIGH SENSITIVITY) - Abnormal; Notable for the following components:   Troponin I (High Sensitivity) 67 (*)    All other components within normal limits  TROPONIN I (HIGH SENSITIVITY)    EKG EKG Interpretation  Date/Time:  Wednesday September 10 2022 10:44:41 EDT Ventricular Rate:  110 PR Interval:  150 QRS Duration: 131 QT Interval:  347 QTC Calculation: 470 R Axis:   -37 Text Interpretation: Sinus tachycardia new Nonspecific IVCD with LAD Anteroseptal infarct, age indeterminate ST elevation, consider inferior injury Lateral leads are also involved Artifact Confirmed by Gwyneth Sprout ([id number]) on 09/10/2022 10:53:09 AM  Radiology DG Chest Port 1 View  Result Date: 09/10/2022 CLINICAL DATA:  Shortness of breath EXAM: PORTABLE CHEST 1 VIEW COMPARISON:  X-ray 09/08/2022 FINDINGS: Hyperinflation. Likely chronic interstitial lung changes. No consolidation, pneumothorax or effusion. No edema. Normal cardiopericardial silhouette with a calcified aorta. Overlapping cardiac leads. IMPRESSION: No acute cardiopulmonary disease. Hyperinflation with chronic changes Electronically Signed   By: Karen Kays M.D.   On: 09/10/2022 11:20   DG Chest 2 View  Result Date: 09/08/2022 CLINICAL DATA:  Cough and shortness of breath. EXAM: CHEST - 2 VIEW COMPARISON:  CT 05/08/2022 FINDINGS: Heart size and mediastinal contours appear normal. Lungs are hyperinflated with chronic interstitial changes of emphysema. No superimposed pleural effusion, interstitial edema or airspace disease. IMPRESSION: 1. No active cardiopulmonary abnormalities. 2. Emphysema. Electronically Signed   By: Signa Kell M.D.   On: 09/08/2022 13:09  Procedures Procedures     Medications Ordered in ED Medications  albuterol (PROVENTIL) (2.5 MG/3ML) 0.083% nebulizer solution (15 mg/hr Nebulization Not Given 09/10/22 1100)  albuterol (PROVENTIL) (2.5 MG/3ML) 0.083% nebulizer solution (15 mg  Given 09/10/22 1059)    ED Course/ Medical Decision Making/ A&P                             Medical Decision Making Amount and/or Complexity of Data Reviewed Independent Historian: spouse External Data Reviewed: notes. Labs: ordered. Decision-making details documented in ED Course. Radiology: ordered and independent interpretation performed. Decision-making details documented in ED Course. ECG/medicine tests: ordered and independent interpretation performed. Decision-making details documented in ED Course.  Risk Prescription drug management. Decision regarding hospitalization.   Pt with multiple medical problems and comorbidities and presenting today with a complaint that caries a high risk for morbidity and mortality.  Here today with respiratory distress.  Patient is currently on CPAP and has already received 3 rounds of albuterol, Atrovent, Solu-Medrol, magnesium.  Patient is significantly diminished throughout with occasional wheezes.  Concern for COPD exacerbation.  Lower concern for pneumothorax, MI, PE, dissection, pneumonia.  Patient was transitioned over to BiPAP and currently heart rate is improving and patient looks more comfortable yet still tachypneic.  Will give a continuous albuterol neb.  I independently interpreted patient's EKG which does show an indeterminate bundle branch block with some elevation in V3 however does not meet STEMI criteria.  Cardiology has evaluated EKG and canceled code STEMI.  Patient had clean catheterization several years ago.  12:20 PM I independently reviewed patient's labs and patient has a leukocytosis today of 26, stable hemoglobin, VBG with compensated respiratory acidosis with a CO2 of 78, bicarb of 37 and pH at 7.29 BNP mildly  elevated to 36 similar to prior and CMP with minimal AKI with creatinine 1.62, potassium of 3.3 and blood sugar of 353, troponins are at baseline at 67.  I have independently visualized and interpreted pt's images today.  Chest x-ray without acute findings today.  After an hour-long continuous neb and ongoing BiPAP patient is looking better.  Breath sounds are still decreased globally but she overall looks much more comfortable.  Her husband is now in the room and reports that she did go to her doctor 2 days ago and started taking a Z-Pak but symptoms were not improving.  No evidence of pneumonia at this time.  Will admit for further care.  Patient and her husband are comfortable with this plan.  Consulted hospitalist for admission.  CRITICAL CARE Performed by: Lyrique Hakim Total critical care time: 30 minutes Critical care time was exclusive of separately billable procedures and treating other patients. Critical care was necessary to treat or prevent imminent or life-threatening deterioration. Critical care was time spent personally by me on the following activities: development of treatment plan with patient and/or surrogate as well as nursing, discussions with consultants, evaluation of patient's response to treatment, examination of patient, obtaining history from patient or surrogate, ordering and performing treatments and interventions, ordering and review of laboratory studies, ordering and review of radiographic studies, pulse oximetry and re-evaluation of patient's condition.           Final Clinical Impression(s) / ED Diagnoses Final diagnoses:  Acute respiratory failure with hypoxia (HCC)  COPD with acute exacerbation (HCC)    Rx / DC Orders ED Discharge Orders     None  Gwyneth Sprout, MD 09/10/22 1220

## 2022-09-11 ENCOUNTER — Observation Stay (HOSPITAL_COMMUNITY): Payer: Medicare Other

## 2022-09-11 DIAGNOSIS — F32A Depression, unspecified: Secondary | ICD-10-CM | POA: Diagnosis not present

## 2022-09-11 DIAGNOSIS — I5023 Acute on chronic systolic (congestive) heart failure: Secondary | ICD-10-CM | POA: Diagnosis not present

## 2022-09-11 DIAGNOSIS — R7989 Other specified abnormal findings of blood chemistry: Secondary | ICD-10-CM | POA: Diagnosis not present

## 2022-09-11 DIAGNOSIS — I447 Left bundle-branch block, unspecified: Secondary | ICD-10-CM | POA: Diagnosis present

## 2022-09-11 DIAGNOSIS — E785 Hyperlipidemia, unspecified: Secondary | ICD-10-CM | POA: Diagnosis present

## 2022-09-11 DIAGNOSIS — I2510 Atherosclerotic heart disease of native coronary artery without angina pectoris: Secondary | ICD-10-CM | POA: Diagnosis present

## 2022-09-11 DIAGNOSIS — I21A1 Myocardial infarction type 2: Secondary | ICD-10-CM | POA: Diagnosis not present

## 2022-09-11 DIAGNOSIS — I472 Ventricular tachycardia, unspecified: Secondary | ICD-10-CM | POA: Diagnosis present

## 2022-09-11 DIAGNOSIS — N179 Acute kidney failure, unspecified: Secondary | ICD-10-CM | POA: Diagnosis present

## 2022-09-11 DIAGNOSIS — J9621 Acute and chronic respiratory failure with hypoxia: Secondary | ICD-10-CM | POA: Diagnosis not present

## 2022-09-11 DIAGNOSIS — Z1152 Encounter for screening for COVID-19: Secondary | ICD-10-CM | POA: Diagnosis not present

## 2022-09-11 DIAGNOSIS — I5043 Acute on chronic combined systolic (congestive) and diastolic (congestive) heart failure: Secondary | ICD-10-CM | POA: Diagnosis not present

## 2022-09-11 DIAGNOSIS — E876 Hypokalemia: Secondary | ICD-10-CM | POA: Diagnosis present

## 2022-09-11 DIAGNOSIS — I5181 Takotsubo syndrome: Secondary | ICD-10-CM | POA: Diagnosis present

## 2022-09-11 DIAGNOSIS — I5022 Chronic systolic (congestive) heart failure: Secondary | ICD-10-CM | POA: Diagnosis not present

## 2022-09-11 DIAGNOSIS — J9601 Acute respiratory failure with hypoxia: Secondary | ICD-10-CM | POA: Diagnosis present

## 2022-09-11 DIAGNOSIS — J441 Chronic obstructive pulmonary disease with (acute) exacerbation: Secondary | ICD-10-CM | POA: Diagnosis present

## 2022-09-11 DIAGNOSIS — I051 Rheumatic mitral insufficiency: Secondary | ICD-10-CM | POA: Diagnosis present

## 2022-09-11 DIAGNOSIS — I214 Non-ST elevation (NSTEMI) myocardial infarction: Secondary | ICD-10-CM

## 2022-09-11 DIAGNOSIS — Z7952 Long term (current) use of systemic steroids: Secondary | ICD-10-CM | POA: Diagnosis not present

## 2022-09-11 DIAGNOSIS — I5042 Chronic combined systolic (congestive) and diastolic (congestive) heart failure: Secondary | ICD-10-CM | POA: Diagnosis not present

## 2022-09-11 DIAGNOSIS — I10 Essential (primary) hypertension: Secondary | ICD-10-CM | POA: Diagnosis not present

## 2022-09-11 DIAGNOSIS — M810 Age-related osteoporosis without current pathological fracture: Secondary | ICD-10-CM | POA: Diagnosis present

## 2022-09-11 DIAGNOSIS — E8729 Other acidosis: Secondary | ICD-10-CM | POA: Diagnosis not present

## 2022-09-11 DIAGNOSIS — Z66 Do not resuscitate: Secondary | ICD-10-CM | POA: Diagnosis not present

## 2022-09-11 DIAGNOSIS — I110 Hypertensive heart disease with heart failure: Secondary | ICD-10-CM | POA: Diagnosis not present

## 2022-09-11 DIAGNOSIS — I959 Hypotension, unspecified: Secondary | ICD-10-CM | POA: Diagnosis not present

## 2022-09-11 DIAGNOSIS — I428 Other cardiomyopathies: Secondary | ICD-10-CM | POA: Diagnosis not present

## 2022-09-11 DIAGNOSIS — Z8249 Family history of ischemic heart disease and other diseases of the circulatory system: Secondary | ICD-10-CM | POA: Diagnosis not present

## 2022-09-11 DIAGNOSIS — J9622 Acute and chronic respiratory failure with hypercapnia: Secondary | ICD-10-CM | POA: Diagnosis not present

## 2022-09-11 DIAGNOSIS — F419 Anxiety disorder, unspecified: Secondary | ICD-10-CM | POA: Diagnosis present

## 2022-09-11 LAB — COMPREHENSIVE METABOLIC PANEL
ALT: 28 U/L (ref 0–44)
AST: 41 U/L (ref 15–41)
Albumin: 3 g/dL — ABNORMAL LOW (ref 3.5–5.0)
Alkaline Phosphatase: 55 U/L (ref 38–126)
Anion gap: 13 (ref 5–15)
BUN: 36 mg/dL — ABNORMAL HIGH (ref 8–23)
CO2: 28 mmol/L (ref 22–32)
Calcium: 8.6 mg/dL — ABNORMAL LOW (ref 8.9–10.3)
Chloride: 98 mmol/L (ref 98–111)
Creatinine, Ser: 1.21 mg/dL — ABNORMAL HIGH (ref 0.44–1.00)
GFR, Estimated: 49 mL/min — ABNORMAL LOW (ref >60)
Glucose, Bld: 145 mg/dL — ABNORMAL HIGH (ref 70–99)
Potassium: 3.4 mmol/L — ABNORMAL LOW (ref 3.5–5.1)
Sodium: 139 mmol/L (ref 135–145)
Total Bilirubin: 0.4 mg/dL (ref 0.3–1.2)
Total Protein: 5.3 g/dL — ABNORMAL LOW (ref 6.5–8.1)

## 2022-09-11 LAB — BLOOD GAS, ARTERIAL
Acid-Base Excess: 6.4 mmol/L — ABNORMAL HIGH (ref 0.0–2.0)
Bicarbonate: 31.9 mmol/L — ABNORMAL HIGH (ref 20.0–28.0)
O2 Saturation: 97.8 %
Patient temperature: 37
pCO2 arterial: 48 mmHg (ref 32–48)
pH, Arterial: 7.43 (ref 7.35–7.45)
pO2, Arterial: 89 mmHg (ref 83–108)

## 2022-09-11 LAB — ECHOCARDIOGRAM COMPLETE
AR max vel: 2.56 cm2
AV Area VTI: 2.57 cm2
AV Area mean vel: 2.11 cm2
AV Mean grad: 5 mmHg
AV Peak grad: 8.4 mmHg
Ao pk vel: 1.45 m/s
Area-P 1/2: 4.93 cm2
Height: 61.5 in
P 1/2 time: 337 msec
S' Lateral: 3.9 cm
Weight: 2240 oz

## 2022-09-11 LAB — CBC
HCT: 35.1 % — ABNORMAL LOW (ref 36.0–46.0)
Hemoglobin: 11.4 g/dL — ABNORMAL LOW (ref 12.0–15.0)
MCH: 32.4 pg (ref 26.0–34.0)
MCHC: 32.5 g/dL (ref 30.0–36.0)
MCV: 99.7 fL (ref 80.0–100.0)
Platelets: 226 10*3/uL (ref 150–400)
RBC: 3.52 MIL/uL — ABNORMAL LOW (ref 3.87–5.11)
RDW: 13.9 % (ref 11.5–15.5)
WBC: 15.8 10*3/uL — ABNORMAL HIGH (ref 4.0–10.5)
nRBC: 0 % (ref 0.0–0.2)

## 2022-09-11 LAB — TROPONIN I (HIGH SENSITIVITY)
Troponin I (High Sensitivity): 720 ng/L (ref <18)
Troponin I (High Sensitivity): 1169 ng/L (ref <18)
Troponin I (High Sensitivity): 876 ng/L (ref <18)

## 2022-09-11 MED ORDER — METHYLPREDNISOLONE SODIUM SUCC 125 MG IJ SOLR
120.0000 mg | INTRAMUSCULAR | Status: DC
  Administered 2022-09-11: 120 mg via INTRAVENOUS
  Filled 2022-09-11: qty 2

## 2022-09-11 MED ORDER — POTASSIUM CHLORIDE CRYS ER 20 MEQ PO TBCR
40.0000 meq | EXTENDED_RELEASE_TABLET | Freq: Once | ORAL | Status: AC
  Administered 2022-09-11: 40 meq via ORAL
  Filled 2022-09-11: qty 2

## 2022-09-11 MED ORDER — HYDROCODONE-ACETAMINOPHEN 5-325 MG PO TABS
1.0000 | ORAL_TABLET | Freq: Four times a day (QID) | ORAL | Status: DC | PRN
  Administered 2022-09-11 – 2022-09-16 (×4): 1 via ORAL
  Filled 2022-09-11 (×4): qty 1

## 2022-09-11 MED ORDER — REVEFENACIN 175 MCG/3ML IN SOLN
175.0000 ug | Freq: Every day | RESPIRATORY_TRACT | Status: DC
  Administered 2022-09-11 – 2022-09-18 (×8): 175 ug via RESPIRATORY_TRACT
  Filled 2022-09-11 (×8): qty 3

## 2022-09-11 MED ORDER — BUDESONIDE 0.5 MG/2ML IN SUSP
0.5000 mg | Freq: Two times a day (BID) | RESPIRATORY_TRACT | Status: DC
  Administered 2022-09-11 – 2022-09-18 (×15): 0.5 mg via RESPIRATORY_TRACT
  Filled 2022-09-11 (×15): qty 2

## 2022-09-11 MED ORDER — HYDROCODONE-ACETAMINOPHEN 7.5-325 MG PO TABS: 1.0000 | ORAL_TABLET | Freq: Four times a day (QID) | ORAL | Status: DC | PRN

## 2022-09-11 MED ORDER — ARFORMOTEROL TARTRATE 15 MCG/2ML IN NEBU
15.0000 ug | INHALATION_SOLUTION | Freq: Two times a day (BID) | RESPIRATORY_TRACT | Status: DC
  Administered 2022-09-11 – 2022-09-18 (×15): 15 ug via RESPIRATORY_TRACT
  Filled 2022-09-11 (×15): qty 2

## 2022-09-11 MED ORDER — LEVOFLOXACIN 750 MG PO TABS
750.0000 mg | ORAL_TABLET | ORAL | Status: DC
  Administered 2022-09-11 – 2022-09-15 (×3): 750 mg via ORAL
  Filled 2022-09-11 (×3): qty 1

## 2022-09-11 MED ORDER — METHYLPREDNISOLONE SODIUM SUCC 125 MG IJ SOLR
80.0000 mg | INTRAMUSCULAR | Status: DC
  Administered 2022-09-12 – 2022-09-13 (×2): 80 mg via INTRAVENOUS
  Filled 2022-09-11 (×2): qty 2

## 2022-09-11 NOTE — Consult Note (Signed)
NAME:  Kim [name], MRN:  [id number], DOB:  05/27/1955, LOS: 0 ADMISSION DATE:  09/10/2022, CONSULTATION DATE:  09/11/2022  REFERRING MD:  Trevor Mace, CHIEF COMPLAINT: Respiratory distress  History of Present Illness:  Kim [name] is a 67 year old ex-smoker with severe COPD, chronic respiratory failure on 2 L oxygen and NIV who is known to me for several years.  We have tried Dupixent injections in the past because her exacerbations have explosive onset suggestive of asthma but she had a reactions.  I have given her prednisone prescription for self administration.  Her last exacerbation was about a year ago. She reports that she is struggling with this flare for a few days.  Husband reports exposure to welding fumes when he was working on something in the house, and smoke when a neighbor was burning something.  She went to urgent care, chest x-ray did not show any infiltrates and she was given a Z-Pak.  She started self administering prednisone but her breathing got worse and she had to call EMS. She was placed on BiPAP, labs showed troponin of 67 then 700, leukocytosis  Pertinent  Medical History  Takatsubo cardiomyopathy 2015, 2017 and 2022,  osteoporosis ,vertebral fractures   Significant Hospital Events: Including procedures, antibiotic start and stop dates in addition to other pertinent events     Interim History / Subjective:  Feels improved today Reports anxiety, she felt like she was not going to make it out of the hospital  Objective   Blood pressure 94/61, pulse 77, temperature 98.1 F (36.7 C), temperature source Oral, resp. rate 19, height 5' 1.5" (1.562 m), weight 63.5 kg, SpO2 97 %.    FiO2 (%):  [28 %-32 %] 28 %   Intake/Output Summary (Last 24 hours) at 09/11/2022 1534 Last data filed at 09/11/2022 1432 Gross per 24 hour  Intake 601.87 ml  Output --  Net 601.87 ml   Filed Weights   09/11/22 0804  Weight: 63.5 kg    Examination: General: Elderly woman, sitting up in  bed, no distress HENT: Steroid facies, no JVD, no thrush Lungs: Decreased breath sounds bilateral, no accessory muscle use Cardiovascular: S1-S2 regular, no murmur Abdomen: Soft, nontender no hepatosplenomegaly Extremities: No edema, no deformity Neuro: Alert, interactive, nonfocal  ABG shows compensated hypercarbia 7.43/48. Labs show mild hypokalemia 3.4 decrease in creatinine to 1.2, peak troponin of 1169, mild leukocytosis   Resolved Hospital Problem list     Assessment & Plan:  Acute on chronic hypercarbic respiratory failure COPD exacerbation  -Agree with IV steroids, will decrease to IV Solu-Medrol 40 every 12 given her history of vertebral fractures -She has an allergy to penicillin but has tolerated cephalosporins in the past.  She will be treated with Levaquin -She was on Advair and Spiriva combination due to other meds being expensive, with DuoNebs for breakthrough. Can use budesonide/Brovana and Yupelri while in the hospital  Elevated troponin History of stress-induced cardiomyopathy  -Cardiology following  Best Practice (right click and "Reselect all SmartList Selections" daily)    Code Status:  DNR Last date of multidisciplinary goals of care discussion : Based on prior conversations, she would not want mechanical ventilation or other forms of life support  Labs   CBC: Recent Labs  Lab 09/10/22 1101 09/10/22 1106 09/11/22 0147  WBC 26.6*  --  15.8*  NEUTROABS 14.4*  --   --   HGB 13.0 13.3 11.4*  HCT 41.8 39.0 35.1*  MCV 103.5*  --  99.7  PLT 301  --  226    Basic Metabolic Panel: Recent Labs  Lab 09/10/22 1101 09/10/22 1106 09/10/22 1321 09/11/22 0147  NA 137 137  --  139  K 3.3* 3.4*  --  3.4*  CL 92*  --   --  98  CO2 31  --   --  28  GLUCOSE 353*  --   --  145*  BUN 33*  --   --  36*  CREATININE 1.62*  --   --  1.21*  CALCIUM 9.1  --   --  8.6*  MG  --   --  3.3*  --    GFR: Estimated Creatinine Clearance: 38.5 mL/min (A) (by C-G  formula based on SCr of 1.21 mg/dL (H)). Recent Labs  Lab 09/10/22 1101 09/11/22 0147  WBC 26.6* 15.8*    Liver Function Tests: Recent Labs  Lab 09/10/22 1101 09/11/22 0147  AST 41 41  ALT 27 28  ALKPHOS 66 55  BILITOT 0.6 0.4  PROT 6.1* 5.3*  ALBUMIN 3.6 3.0*   No results for input(s): "LIPASE", "AMYLASE" in the last 168 hours. No results for input(s): "AMMONIA" in the last 168 hours.  ABG    Component Value Date/Time   PHART 7.43 09/11/2022 1030   PCO2ART 48 09/11/2022 1030   PO2ART 89 09/11/2022 1030   HCO3 31.9 (H) 09/11/2022 1030   TCO2 40 (H) 09/10/2022 1106   ACIDBASEDEF 2.0 09/17/2021 0637   O2SAT 97.8 09/11/2022 1030     Coagulation Profile: No results for input(s): "INR", "PROTIME" in the last 168 hours.  Cardiac Enzymes: No results for input(s): "CKTOTAL", "CKMB", "CKMBINDEX", "TROPONINI" in the last 168 hours.  HbA1C: Hgb A1c MFr Bld  Date/Time Value Ref Range Status  07/14/2022 04:05 PM 6.1 (H) 4.8 - 5.6 % Final    Comment:             Prediabetes: 5.7 - 6.4          Diabetes: >6.4          Glycemic control for adults with diabetes: <7.0   03/22/2018 07:57 AM 6.0 4.6 - 6.5 % Final    Comment:    Glycemic Control Guidelines for People with Diabetes:Non Diabetic:  <6%Goal of Therapy: <7%Additional Action Suggested:  >8%     CBG: No results for input(s): "GLUCAP" in the last 168 hours.  Review of Systems:   Shortness of breath Cough with yellow sputum Wheezing   Constitutional: negative for anorexia, fevers and sweats  Eyes: negative for irritation, redness and visual disturbance  Ears, nose, mouth, throat, and face: negative for earaches, epistaxis, nasal congestion and sore throat  Cardiovascular: negative for chest pain,  lower extremity edema, orthopnea, palpitations and syncope  Gastrointestinal: negative for abdominal pain, constipation, diarrhea, melena, nausea and vomiting  Genitourinary:negative for dysuria, frequency and  hematuria  Hematologic/lymphatic: negative for bleeding, easy bruising and lymphadenopathy  Musculoskeletal:negative for arthralgias, muscle weakness and stiff joints  Neurological: negative for coordination problems, gait problems, headaches and weakness  Endocrine: negative for diabetic symptoms including polydipsia, polyuria and weight loss   Past Medical History:  She,  has a past medical history of C8 RADICULOPATHY (06/05/2009), COPD (08/31/2007), DEPRESSION (11/19/2006), DYSLIPIDEMIA (03/12/2009), MI (mitral incompetence), NEPHROLITHIASIS (01/05/2008), OSTEOARTHRITIS (06/05/2009), PARESTHESIA (08/24/2007), and TOBACCO ABUSE (01/25/2010).   Surgical History:   Past Surgical History:  Procedure Laterality Date   ABDOMINAL HYSTERECTOMY     APPENDECTOMY  1969   CARDIAC CATHETERIZATION N/A 07/05/2015   Procedure: Left Heart Cath  and Coronary Angiography;  Surgeon: Marykay Lex, MD;  Location: [facility] INVASIVE CV LAB;  Service: Cardiovascular;  Laterality: N/A;   CHOLECYSTECTOMY  1989   collapse lung  1990   COLONOSCOPY WITH PROPOFOL N/A 05/14/2018   Procedure: COLONOSCOPY WITH PROPOFOL;  Surgeon: Hilarie Fredrickson, MD;  Location: [facility] ENDOSCOPY;  Service: Endoscopy;  Laterality: N/A;   EXCISION MASS ABDOMINAL  2019   EXPLORATORY LAPAROTOMY  1969   LEFT HEART CATH AND CORONARY ANGIOGRAPHY N/A 11/19/2020   Procedure: LEFT HEART CATH AND CORONARY ANGIOGRAPHY;  Surgeon: Yvonne Kendall, MD;  Location: [facility] INVASIVE CV LAB;  Service: Cardiovascular;  Laterality: N/A;   POLYPECTOMY  05/14/2018   Procedure: POLYPECTOMY;  Surgeon: Hilarie Fredrickson, MD;  Location: [facility] ENDOSCOPY;  Service: Endoscopy;;   SALPINGOOPHORECTOMY  2019     Social History:   reports that she quit smoking about 8 years ago. Her smoking use included cigarettes. She has a 76.00 pack-year smoking history. She has never used smokeless tobacco. She reports that she does not drink alcohol and does not use drugs.   Family History:  Her family history  includes Alcohol abuse in an other family member; Arthritis in an other family member; Cancer in her mother and another family member; Diabetes in her sister and another family member; Heart attack in her maternal uncle, mother, and other family members; Heart disease in an other family member; Hypertension in her brother and sister; Kidney disease in an other family member; Melanoma in an other family member; Multiple sclerosis in her brother and another family member; Osteoporosis in an other family member; Rheum arthritis in her sister; Stroke in her maternal grandmother and another family member; Thyroid disease in her sister. There is no history of Colon cancer.   Allergies Allergies  Allergen Reactions   Tape Other (See Comments)    Skin tears easily, peels skin off   Daliresp [Roflumilast]     Skin peeling   Dupixent [Dupilumab]     Cough, facial swelling, diarrhea, headache   Latex Other (See Comments)    Peels skin off   Lisinopril Cough   Morphine And Codeine     hallucinations   Penicillins Hives and Other (See Comments)    Tolerates Omnicef Has patient had a PCN reaction causing immediate rash, facial/tongue/throat swelling, SOB or lightheadedness with hypotension: Yes Has patient had a PCN reaction causing severe rash involving mucus membranes or skin necrosis: No Has patient had a PCN reaction that required hospitalization No Has patient had a PCN reaction occurring within the last 10 years: No If all of the above answers are "NO", then may proceed with Cephalosporin use.    Statins Other (See Comments)    Myalgia    Omnicef [Cefdinir] Rash     Home Medications  Prior to Admission medications   Medication Sig Start Date End Date Taking? Authorizing Provider  acetaminophen (TYLENOL) 500 MG tablet Take 500 mg by mouth 2 (two) times daily as needed for headache.    [provider]  albuterol (VENTOLIN HFA) 108 (90 Base) MCG/ACT inhaler INHALE 2 PUFFS INTO THE  LUNGS EVERY 4 TO 6 HOURS AS NEEDED FOR SHORTNESS OF BREATH OR WHEEZING 07/23/22   Cobb, Ruby Cola, NP  ALPRAZolam (XANAX) 0.5 MG tablet TAKE 1 TABLET(0.5 MG) BY MOUTH THREE TIMES DAILY AS NEEDED FOR ANXIETY 05/21/22   Nafziger, Kandee Keen, NP  aspirin EC 81 MG EC tablet Take 1 tablet (81 mg total) by mouth daily. 07/08/15  Rhetta Mura, MD  azithromycin (ZITHROMAX) 250 MG tablet Take 1 tablet (250 mg total) by mouth daily. Take first 2 tablets together, then 1 every day until finished. 09/08/22   Carlisle Beers, FNP  bisoprolol (ZEBETA) 5 MG tablet Take 0.5 tablets (2.5 mg total) by mouth daily as needed (Take HALF tablet by mouth for break through chest pain as needed). 10/14/21   Marinus Maw, MD  budesonide-formoterol [facility]) 160-4.5 MCG/ACT inhaler Inhale 2 puffs into the lungs 2 (two) times daily. 07/15/22   Oretha Milch, MD  butalbital-acetaminophen-caffeine (FIORICET) [phone] MG tablet Take 1 tablet by mouth every 4 (four) hours as needed for headache. TAKE 1 TABLET BY MOUTH EVERY 4 HOURS AS NEEDED FOR HEADACHETAKE 1 TABLET BY MOUTH EVERY 4 HOURS AS NEEDED FOR HEADACHE 07/31/22   Nafziger, Kandee Keen, NP  cetirizine (ZYRTEC) 10 MG tablet Take 10 mg by mouth daily.    [provider]  Cholecalciferol (VITAMIN D3 PO) Take 5,000 Units by mouth daily.    [provider]  ezetimibe (ZETIA) 10 MG tablet TAKE 1 TABLET(10 MG) BY MOUTH DAILY 01/14/22   Nafziger, Kandee Keen, NP  furosemide (LASIX) 40 MG tablet Take 1 tablet (40 mg total) by mouth daily. 04/14/22   Kathleene Hazel, MD  HYDROcodone-acetaminophen (NORCO) 7.5-325 MG tablet Take 1 tablet by mouth every 6 (six) hours as needed for moderate pain. 08/05/22   Nafziger, Kandee Keen, NP  HYDROcodone-acetaminophen (NORCO) 7.5-325 MG tablet Take 1 tablet by mouth every 6 (six) hours as needed for moderate pain. 08/05/22   Nafziger, Kandee Keen, NP  HYDROcodone-acetaminophen (NORCO) 7.5-325 MG tablet Take 1 tablet by mouth every 6 (six) hours  as needed for moderate pain. 08/05/22   Nafziger, Kandee Keen, NP  ipratropium-albuterol (DUONEB) 0.5-2.5 (3) MG/3ML SOLN USE 3 ML VIA NEBULIZER EVERY 6 HOURS AS NEEDED 05/16/22   Oretha Milch, MD  magnesium oxide (MAG-OX) 400 MG tablet Take 400 mg by mouth daily.    [provider]  MUCINEX MAXIMUM STRENGTH 1200 MG TB12 Take 1,200 mg by mouth every 12 (twelve) hours.    [provider]  nitroGLYCERIN (NITROSTAT) 0.4 MG SL tablet Place 1 tablet (0.4 mg total) under the tongue every 5 (five) minutes as needed for chest pain. 10/24/20   Kathleene Hazel, MD  ondansetron (ZOFRAN) 4 MG tablet Take 1 tablet (4 mg total) by mouth every 8 (eight) hours as needed for nausea or vomiting. 09/05/21   Nafziger, Kandee Keen, NP  OXYGEN Inhale 2 L/min into the lungs continuous.    [provider]  PARoxetine (PAXIL) 40 MG tablet TAKE 1 TABLET(40 MG) BY MOUTH EVERY MORNING 04/08/22   Nafziger, Kandee Keen, NP  potassium chloride (KLOR-CON) 10 MEQ tablet Take 1 tablet (10 mEq total) by mouth as needed (take with furosemide (lasix)). 06/05/22   Kathleene Hazel, MD  predniSONE (DELTASONE) 10 MG tablet TAKE 1 TABLET(10 MG) BY MOUTH DAILY 05/16/22   Oretha Milch, MD  predniSONE (DELTASONE) 5 MG tablet TAKE 1 TABLET(5 MG) BY MOUTH DAILY WITH BREAKFAST 09/08/22   Oretha Milch, MD  rosuvastatin (CRESTOR) 20 MG tablet Take 1 tablet (20 mg total) by mouth daily. 07/14/22   Kathleene Hazel, MD  Spacer/Aero Chamber Mouthpiece MISC 1 Device by Does not apply route as directed. 01/12/17   Roslynn Amble, MD      Cyril Mourning MD. FCCP. [facility] Pager : [phone]  If no response to pager , please call  [phone] until 7 pm After 7:00 pm call Elink  [phone]   09/11/2022

## 2022-09-11 NOTE — Progress Notes (Signed)
   09/11/22 0104  BiPAP/CPAP/SIPAP  $ Non-Invasive Ventilator  (S)  Non-Invasive Vent Set Up  BiPAP/CPAP/SIPAP Pt Type Adult  BiPAP/CPAP/SIPAP (S)  V60  Mask Type Full face mask  Mask Size Medium  Respiratory Rate 23 breaths/min  IPAP 10 cmH20  EPAP 8 cmH2O  FiO2 (%) 28 %  Minute Ventilation 8.5  Leak 0  Peak Inspiratory Pressure (PIP) 10  Tidal Volume (Vt) 336  Patient Home Equipment No  Auto Titrate No  Press High Alarm 25 cmH2O  Press Low Alarm 5 cmH2O  CPAP/SIPAP surface wiped down Yes  BiPAP/CPAP /SiPAP Vitals  SpO2 100 %  Bilateral Breath Sounds Diminished   Placed patient on V-60 for better monitoring when patient has her panic and anxiety moments.

## 2022-09-11 NOTE — Care Management Obs Status (Signed)
MEDICARE OBSERVATION STATUS NOTIFICATION   Patient Details  Name: Kim [name] MRN: [id number] Date of Birth: [birth year]   Medicare Observation Status Notification Given:  Yes    Leone Haven, RN 09/11/2022, 11:34 AM

## 2022-09-11 NOTE — Progress Notes (Addendum)
PROGRESS NOTE  Kim [name]  ZOX:[id number] DOB: 04-12-1955 DOA: 09/10/2022 PCP: Shirline Frees, NP   Brief Narrative: Patient is a 67 year old female with history of nonobstructive coronary disease, hypertension, hyperlipidemia, diastolic CHF, stress-induced cardiomyopathy, COPD, chronic hypoxic respiratory failure on oxygen at home at 2 L presented with shortness of breath.  She also uses BiPAP/ventilation at night.  Patient was also taking prednisone which was prescribed as needed but it did not help.  She was  he was seen at urgent care on  2 days ago,found to have clear X-ray and was prescribed Z-Pak.  Patient presented to the emergency department because of worsening shortness of breath.  EMS put on nonrebreather.  Given bronchodilator treatment and Solu-Medrol.  On presentation, blood pressure was stable.  She was put on BiPAP.  Lab work showed potassium 3.3, creatinine of 1.6, WBC count of 26.6, hemoglobin, troponin 67 which increased to 700s.  Cardiology /pulmonology consulted today.  Assessment & Plan:  Principal Problem:   Acute on chronic respiratory failure with hypoxia and hypercapnia (HCC) Active Problems:   COPD with acute exacerbation (HCC)   Chronic combined systolic (congestive) and diastolic (congestive) heart failure (HCC)   Anxiety and depression   Essential hypertension   AKI (acute kidney injury) (HCC)   Dyslipidemia   Coronary artery calcification seen on CAT scan   HLD (hyperlipidemia)   Heart failure with preserved ejection fraction (HCC)   Acute on chronic hypoxic respiratory failure secondary to COPD exacerbation: Presented with worsening shortness of breath for last 4 days, not improving with prednisone and Z-Pak.  Wheezing on presentation with leukocytosis.  Respiratory viral panel negative, COVID-negative.  She is on chronic prednisone therapy at home and also on BiPAP/ NIV at night. Started on steroid, antibiotic, bronchodilators.  We might consider putting  her on yupleri,brovana.  Continue Pulmicort.  Will check ABG.  Pulmonology consulted today.  Steroids will be changed to IV. Presented with leukocytosis. This is most likely secondary to steroids.  Will continue antibiotic therapy.  AKI/hypokalemia: Baseline creatinine around 1.1.  Presented with creatinine of 1.6.  Potassium being supplemented.  Kidney function close to baseline.  History of diastolic CHF: Also has history of stress-induced cardiomyopathy.  Had low EF of 15 to 20% in 2017 but it has improved to 60 to 65% as per recent echo.  Elevated BNP.  But overall appears euvolemic today  Elevated troponin: Denies any chest pain but says her chest is heavy.  There was concern for NSTEMI on presentation.  Creatinine has trended up from 60 to 700s.  Will consult cardiology.  This is most likely from demand ischemia  Hypertension: Currently blood pressure soft.  Bisoprolol on hold  Hyperlipidemia: On Zetia, rosuvastatin  Anxiety/depression: On paxil, Xanax  Hypokalemia: Supplemented potassium       DVT prophylaxis:enoxaparin (LOVENOX) injection 40 mg Start: 09/10/22 1300     Code Status: DNR  Family Communication: Husband at bedside  Patient status:Obs  Patient is from :Home  Anticipated discharge WJ:XBJY  Estimated DC date:2-3 days   Consultants: Cardiology, PCCM  Procedures:None  Antimicrobials:  Anti-infectives (From admission, onward)    None       Subjective: Patient seen and examined the bedside today.  Hemodynamically stable.  She was given blood treatment during my evaluation.  On 4 L of oxygen per minute.  Complains of chest heaviness, shortness of breath.  Examination reveals severely decreased bilateral air entry, wheezing.  Long discussion had with the husband and patient about  the management plan.  Objective: Vitals:   09/10/22 2057 09/11/22 0104 09/11/22 0138 09/11/22 0639  BP: (!) 162/94  100/69 (!) 93/55  Pulse:   81 75  Resp: 20  18 20    Temp:    97.8 F (36.6 C)  TempSrc:   Oral Axillary  SpO2: 99% 100% 96% 97%    Intake/Output Summary (Last 24 hours) at 09/11/2022 0749 Last data filed at 09/10/2022 1800 Gross per 24 hour  Intake 481.87 ml  Output --  Net 481.87 ml   There were no vitals filed for this visit.  Examination:  General exam: In moderate respiratory distress HEENT: PERRL Respiratory system: Severely initially sounds bilaterally, bilateral expiratory wheezing Cardiovascular system: S1 & S2 heard, RRR.  Gastrointestinal system: Abdomen is nondistended, soft and nontender. Central nervous system: Alert and oriented Extremities: No edema, no clubbing ,no cyanosis Skin: No rashes, no ulcers,no icterus     Data Reviewed: I have personally reviewed following labs and imaging studies  CBC: Recent Labs  Lab 09/10/22 1101 09/10/22 1106 09/11/22 0147  WBC 26.6*  --  15.8*  NEUTROABS 14.4*  --   --   HGB 13.0 13.3 11.4*  HCT 41.8 39.0 35.1*  MCV 103.5*  --  99.7  PLT 301  --  226   Basic Metabolic Panel: Recent Labs  Lab 09/10/22 1101 09/10/22 1106 09/10/22 1321 09/11/22 0147  NA 137 137  --  139  K 3.3* 3.4*  --  3.4*  CL 92*  --   --  98  CO2 31  --   --  28  GLUCOSE 353*  --   --  145*  BUN 33*  --   --  36*  CREATININE 1.62*  --   --  1.21*  CALCIUM 9.1  --   --  8.6*  MG  --   --  3.3*  --      Recent Results (from the past 240 hour(s))  Respiratory (~20 pathogens) panel by PCR     Status: None   Collection Time: 09/10/22 12:41 PM   Specimen: Nasopharyngeal Swab; Respiratory  Result Value Ref Range Status   Adenovirus NOT DETECTED NOT DETECTED Final   Coronavirus 229E NOT DETECTED NOT DETECTED Final    Comment: (NOTE) The Coronavirus on the Respiratory Panel, DOES NOT test for the novel  Coronavirus (2019 nCoV)    Coronavirus HKU1 NOT DETECTED NOT DETECTED Final   Coronavirus NL63 NOT DETECTED NOT DETECTED Final   Coronavirus OC43 NOT DETECTED NOT DETECTED Final    Metapneumovirus NOT DETECTED NOT DETECTED Final   Rhinovirus / Enterovirus NOT DETECTED NOT DETECTED Final   Influenza A NOT DETECTED NOT DETECTED Final   Influenza B NOT DETECTED NOT DETECTED Final   Parainfluenza Virus 1 NOT DETECTED NOT DETECTED Final   Parainfluenza Virus 2 NOT DETECTED NOT DETECTED Final   Parainfluenza Virus 3 NOT DETECTED NOT DETECTED Final   Parainfluenza Virus 4 NOT DETECTED NOT DETECTED Final   Respiratory Syncytial Virus NOT DETECTED NOT DETECTED Final   Bordetella pertussis NOT DETECTED NOT DETECTED Final   Bordetella Parapertussis NOT DETECTED NOT DETECTED Final   Chlamydophila pneumoniae NOT DETECTED NOT DETECTED Final   Mycoplasma pneumoniae NOT DETECTED NOT DETECTED Final    Comment: Performed at [facility], [address], [place], [place] [address]  Resp Panel by RT-PCR (Flu A&B, Covid)     Status: None   Collection Time: 09/10/22 12:42 PM  Result Value  Ref Range Status   SARS Coronavirus 2 by RT PCR NEGATIVE NEGATIVE Final   Influenza A by PCR NEGATIVE NEGATIVE Final   Influenza B by PCR NEGATIVE NEGATIVE Final    Comment: (NOTE) The Xpert Xpress SARS-CoV-2/FLU/RSV plus assay is intended as an aid in the diagnosis of influenza from Nasopharyngeal swab specimens and should not be used as a sole basis for treatment. Nasal washings and aspirates are unacceptable for Xpert Xpress SARS-CoV-2/FLU/RSV testing.  Fact Sheet for Patients: BloggerCourse.com  Fact Sheet for Healthcare Providers: SeriousBroker.it  This test is not yet approved or cleared by the [place] FDA and has been authorized for detection and/or diagnosis of SARS-CoV-2 by FDA under an Emergency Use Authorization (EUA). This EUA will remain in effect (meaning this test can be used) for the duration of the COVID-19 declaration under Section 564(b)(1) of the Act, 21 U.S.C. section 360bbb-3(b)(1), unless the authorization is  terminated or revoked.  Performed at [facility], [address], [place], [place] [address]      Radiology Studies: DG Chest Port 1 View  Result Date: 09/10/2022 CLINICAL DATA:  Shortness of breath EXAM: PORTABLE CHEST 1 VIEW COMPARISON:  X-ray 09/08/2022 FINDINGS: Hyperinflation. Likely chronic interstitial lung changes. No consolidation, pneumothorax or effusion. No edema. Normal cardiopericardial silhouette with a calcified aorta. Overlapping cardiac leads. IMPRESSION: No acute cardiopulmonary disease. Hyperinflation with chronic changes Electronically Signed   By: Karen Kays M.D.   On: 09/10/2022 11:20    Scheduled Meds:  albuterol  15 mg/hr Nebulization Once   aspirin EC  81 mg Oral Daily   enoxaparin (LOVENOX) injection  40 mg Subcutaneous Q24H   ezetimibe  10 mg Oral Daily   ipratropium  0.5 mg Nebulization Q6H   mometasone-formoterol  2 puff Inhalation BID   PARoxetine  40 mg Oral Daily   predniSONE  50 mg Oral Q breakfast   rosuvastatin  20 mg Oral Daily   sodium chloride flush  3 mL Intravenous Q12H   Continuous Infusions:   LOS: 0 days   Burnadette Pop, MD [facility] P6/09/2022, 7:49 AM

## 2022-09-11 NOTE — Progress Notes (Signed)
NT was in the room getting EKG when patient's husband gave her a pill. Patient's husband reports it was her home xanax. Educated patient on risks of taking home medications while in the hospital and educated on hospital policy. Patient and her husband are very understanding, and the husband will take all medications home. Charge RN notified and xanax dose documented on [facility]

## 2022-09-11 NOTE — Consult Note (Addendum)
Cardiology Consultation   Patient ID: Kim [name] MRN: [id number]; DOB: 01-Sep-1955  Admit date: 09/10/2022 Date of Consult: 09/11/2022  PCP:  Shirline Frees, NP   [facility] Providers Cardiologist:  Verne Carrow, MD        Patient Profile:   Kim [name] is a 67 y.o. female with a hx of non-obstructive CAD, non-ischemic cardiomyopathy/HFpEF, severe COPD with chronic respiratory failure, hyperlipidemia, tobacco use who is being seen 09/11/2022 for the evaluation of elevated troponin at the request of Dr. Renford Dills.  History of Present Illness:   [name] presented to the ED on 6/5 with symptoms of acute dyspnea. At baseline, patient uses ~3L oxygen and utilizes BiPAP nightly. Over the proceeding 4 days, she felt increasingly dyspneic. Patient has Prednisone at home which she takes PRN for dyspnea. Despite use of this, symptoms progressed and she was seen in urgent care on 6/3, started on azithromycin. On day of admission, patient developed sudden severe dyspnea and called EMS. Patient received extensive pre-hospital pulmonary care with albuterol, magnesium, solu-medrol, and CPAP. En route to the hospital, EMS noted ST elevation and activated STEMI. STEMI cancelled upon arrival to ED. Initial workup in the ED notable for respiratory acidosis, CO2 78, bicarbonate 37, pH 7.29. BNP 36, creatinine 1.62, WBC 26.6. Initial troponin 67, uptrended to 720 (consistent with delta troponin in previous admissions for COPD exacerbation).   On my exam today, patient adds to above history that she has needed to take Lasix daily for about the last week because of daily weight gain ~4 lbs and fluid accumulation in hands. Symptoms would resolve with lasix but recur the next day. Otherwise, no significant change to exertional tolerance until COPD exacerbation this weekend. Denies chest pain, palpitations, orthopnea, lightheadedness/dizziness.   Past Medical History:  Diagnosis Date   C8  RADICULOPATHY 06/05/2009   COPD 08/31/2007   DEPRESSION 11/19/2006   DYSLIPIDEMIA 03/12/2009   MI (mitral incompetence)    NEPHROLITHIASIS 01/05/2008   OSTEOARTHRITIS 06/05/2009   PARESTHESIA 08/24/2007   TOBACCO ABUSE 01/25/2010    Past Surgical History:  Procedure Laterality Date   ABDOMINAL HYSTERECTOMY     APPENDECTOMY  1969   CARDIAC CATHETERIZATION N/A 07/05/2015   Procedure: Left Heart Cath and Coronary Angiography;  Surgeon: Marykay Lex, MD;  Location: [facility] INVASIVE CV LAB;  Service: Cardiovascular;  Laterality: N/A;   CHOLECYSTECTOMY  1989   collapse lung  1990   COLONOSCOPY WITH PROPOFOL N/A 05/14/2018   Procedure: COLONOSCOPY WITH PROPOFOL;  Surgeon: Hilarie Fredrickson, MD;  Location: [facility] ENDOSCOPY;  Service: Endoscopy;  Laterality: N/A;   EXCISION MASS ABDOMINAL  2019   EXPLORATORY LAPAROTOMY  1969   LEFT HEART CATH AND CORONARY ANGIOGRAPHY N/A 11/19/2020   Procedure: LEFT HEART CATH AND CORONARY ANGIOGRAPHY;  Surgeon: Yvonne Kendall, MD;  Location: [facility] INVASIVE CV LAB;  Service: Cardiovascular;  Laterality: N/A;   POLYPECTOMY  05/14/2018   Procedure: POLYPECTOMY;  Surgeon: Hilarie Fredrickson, MD;  Location: [facility] ENDOSCOPY;  Service: Endoscopy;;   SALPINGOOPHORECTOMY  2019     Home Medications:  Prior to Admission medications   Medication Sig [name] Date End Date Taking? Authorizing Provider  acetaminophen (TYLENOL) 500 MG tablet Take 500 mg by mouth 2 (two) times daily as needed for headache.    [provider]  albuterol (VENTOLIN HFA) 108 (90 Base) MCG/ACT inhaler INHALE 2 PUFFS INTO THE LUNGS EVERY 4 TO 6 HOURS AS NEEDED FOR SHORTNESS OF BREATH OR WHEEZING 07/23/22  Cobb, Ruby Cola, NP  ALPRAZolam (XANAX) 0.5 MG tablet TAKE 1 TABLET(0.5 MG) BY MOUTH THREE TIMES DAILY AS NEEDED FOR ANXIETY 05/21/22   Nafziger, Kandee Keen, NP  aspirin EC 81 MG EC tablet Take 1 tablet (81 mg total) by mouth daily. 07/08/15   Rhetta Mura, MD  azithromycin (ZITHROMAX) 250 MG tablet Take 1 tablet (250  mg total) by mouth daily. Take first 2 tablets together, then 1 every day until finished. 09/08/22   Carlisle Beers, FNP  bisoprolol (ZEBETA) 5 MG tablet Take 0.5 tablets (2.5 mg total) by mouth daily as needed (Take HALF tablet by mouth for break through chest pain as needed). 10/14/21   Marinus Maw, MD  budesonide-formoterol [facility]) 160-4.5 MCG/ACT inhaler Inhale 2 puffs into the lungs 2 (two) times daily. 07/15/22   Oretha Milch, MD  butalbital-acetaminophen-caffeine (FIORICET) [phone] MG tablet Take 1 tablet by mouth every 4 (four) hours as needed for headache. TAKE 1 TABLET BY MOUTH EVERY 4 HOURS AS NEEDED FOR HEADACHETAKE 1 TABLET BY MOUTH EVERY 4 HOURS AS NEEDED FOR HEADACHE 07/31/22   Nafziger, Kandee Keen, NP  cetirizine (ZYRTEC) 10 MG tablet Take 10 mg by mouth daily.    [provider]  Cholecalciferol (VITAMIN D3 PO) Take 5,000 Units by mouth daily.    [provider]  ezetimibe (ZETIA) 10 MG tablet TAKE 1 TABLET(10 MG) BY MOUTH DAILY 01/14/22   Nafziger, Kandee Keen, NP  furosemide (LASIX) 40 MG tablet Take 1 tablet (40 mg total) by mouth daily. 04/14/22   Kathleene Hazel, MD  HYDROcodone-acetaminophen (NORCO) 7.5-325 MG tablet Take 1 tablet by mouth every 6 (six) hours as needed for moderate pain. 08/05/22   Nafziger, Kandee Keen, NP  HYDROcodone-acetaminophen (NORCO) 7.5-325 MG tablet Take 1 tablet by mouth every 6 (six) hours as needed for moderate pain. 08/05/22   Nafziger, Kandee Keen, NP  HYDROcodone-acetaminophen (NORCO) 7.5-325 MG tablet Take 1 tablet by mouth every 6 (six) hours as needed for moderate pain. 08/05/22   Nafziger, Kandee Keen, NP  ipratropium-albuterol (DUONEB) 0.5-2.5 (3) MG/3ML SOLN USE 3 ML VIA NEBULIZER EVERY 6 HOURS AS NEEDED 05/16/22   Oretha Milch, MD  magnesium oxide (MAG-OX) 400 MG tablet Take 400 mg by mouth daily.    [provider]  MUCINEX MAXIMUM STRENGTH 1200 MG TB12 Take 1,200 mg by mouth every 12 (twelve) hours.    [provider]   nitroGLYCERIN (NITROSTAT) 0.4 MG SL tablet Place 1 tablet (0.4 mg total) under the tongue every 5 (five) minutes as needed for chest pain. 10/24/20   Kathleene Hazel, MD  ondansetron (ZOFRAN) 4 MG tablet Take 1 tablet (4 mg total) by mouth every 8 (eight) hours as needed for nausea or vomiting. 09/05/21   Nafziger, Kandee Keen, NP  OXYGEN Inhale 2 L/min into the lungs continuous.    [provider]  PARoxetine (PAXIL) 40 MG tablet TAKE 1 TABLET(40 MG) BY MOUTH EVERY MORNING 04/08/22   Nafziger, Kandee Keen, NP  potassium chloride (KLOR-CON) 10 MEQ tablet Take 1 tablet (10 mEq total) by mouth as needed (take with furosemide (lasix)). 06/05/22   Kathleene Hazel, MD  predniSONE (DELTASONE) 10 MG tablet TAKE 1 TABLET(10 MG) BY MOUTH DAILY 05/16/22   Oretha Milch, MD  predniSONE (DELTASONE) 5 MG tablet TAKE 1 TABLET(5 MG) BY MOUTH DAILY WITH BREAKFAST 09/08/22   Oretha Milch, MD  rosuvastatin (CRESTOR) 20 MG tablet Take 1 tablet (20 mg total) by mouth daily. 07/14/22   Kathleene Hazel,  MD  Spacer/Aero Chamber Mouthpiece MISC 1 Device by Does not apply route as directed. 01/12/17   Roslynn Amble, MD    Inpatient Medications: Scheduled Meds:  albuterol  15 mg/hr Nebulization Once   arformoterol  15 mcg Nebulization BID   aspirin EC  81 mg Oral Daily   budesonide (PULMICORT) nebulizer solution  0.5 mg Nebulization BID   enoxaparin (LOVENOX) injection  40 mg Subcutaneous Q24H   ezetimibe  10 mg Oral Daily   levofloxacin  750 mg Oral Q48H   methylPREDNISolone (SOLU-MEDROL) injection  120 mg Intravenous Q24H   PARoxetine  40 mg Oral Daily   revefenacin  175 mcg Nebulization Daily   rosuvastatin  20 mg Oral Daily   sodium chloride flush  3 mL Intravenous Q12H   Continuous Infusions:  PRN Meds: albuterol, albuterol, ALPRAZolam  Allergies:    Allergies  Allergen Reactions   Tape Other (See Comments)    Skin tears easily, peels skin off   Daliresp [Roflumilast]     Skin peeling    Dupixent [Dupilumab]     Cough, facial swelling, diarrhea, headache   Latex Other (See Comments)    Peels skin off   Lisinopril Cough   Morphine And Codeine     hallucinations   Penicillins Hives and Other (See Comments)    Tolerates Omnicef Has patient had a PCN reaction causing immediate rash, facial/tongue/throat swelling, SOB or lightheadedness with hypotension: Yes Has patient had a PCN reaction causing severe rash involving mucus membranes or skin necrosis: No Has patient had a PCN reaction that required hospitalization No Has patient had a PCN reaction occurring within the last 10 years: No If all of the above answers are "NO", then may proceed with Cephalosporin use.    Statins Other (See Comments)    Myalgia    Omnicef [Cefdinir] Rash    Social History:   Social History   Socioeconomic History   Marital status: Married    Spouse name: Thereasa Distance   Number of children: 2   Years of education: Not on file   Highest education level: Not on file  Occupational History   Occupation: Patient care aide-sits with elderly lady  Tobacco Use   Smoking status: Former    Packs/day: 2.00    Years: 38.00    Additional pack years: 0.00    Total pack years: 76.00    Types: Cigarettes    Quit date: 03/07/2014    Years since quitting: 8.5   Smokeless tobacco: Never  Vaping Use   Vaping Use: Never used  Substance and Sexual Activity   Alcohol use: No    Alcohol/week: 0.0 standard drinks of alcohol   Drug use: No   Sexual activity: Not on file  Other Topics Concern   Not on file  Social History Narrative   [facility] (01/12/17):   Originally from [facility]. Has lived in [place] for 17 years. Married. Has 2 dogs currently. No mold and only 1 indoor plant. Had her home professionally cleaned a month ago. No bird or hot tub exposure. 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    Right handed    Social Determinants of Health   Financial Resource Strain:  High Risk (09/10/2021)   Overall Financial Resource Strain (CARDIA)    Difficulty of Paying Living Expenses: Very hard  Food Insecurity: No Food Insecurity (09/10/2021)   Hunger Vital Sign    Worried About Running Out of Food in the  Last Year: Never true    Ran Out of Food in the Last Year: Never true  Transportation Needs: No Transportation Needs (09/10/2021)   PRAPARE - Administrator, Civil Service (Medical): No    Lack of Transportation (Non-Medical): No  Physical Activity: Inactive (03/25/2021)   Exercise Vital Sign    Days of Exercise per Week: 0 days    Minutes of Exercise per Session: 0 min  Stress: No Stress Concern Present (09/10/2021)   [employer] of Occupational Health - Occupational Stress Questionnaire    Feeling of Stress : Not at all  Social Connections: Moderately Isolated (03/25/2021)   Social Connection and Isolation Panel [NHANES]    Frequency of Communication with Friends and Family: More than three times a week    Frequency of Social Gatherings with Friends and Family: More than three times a week    Attends Religious Services: Never    Database administrator or Organizations: No    Attends Banker Meetings: Never    Marital Status: Married  Catering manager Violence: Not At Risk (03/25/2021)   Humiliation, Afraid, Rape, and Kick questionnaire    Fear of Current or Ex-Partner: No    Emotionally Abused: No    Physically Abused: No    Sexually Abused: No    Family History:    Family History  Problem Relation Age of Onset   Cancer Mother        lung   Heart attack Mother    Hypertension Sister    Multiple sclerosis Brother    Diabetes Sister    Rheum arthritis Sister    Thyroid disease Sister    Multiple sclerosis Other    Alcohol abuse Other    Arthritis Other    Diabetes Other    Kidney disease Other    Cancer Other        lung,ovarian,skin, uterine   Stroke Other    Heart disease Other    Melanoma Other     Osteoporosis Other    Heart attack Maternal Uncle    Heart attack Other        NEICE   Heart attack Other        NEPHEW   Hypertension Brother    Stroke Maternal Grandmother    Colon cancer Neg Hx      ROS:  Please see the history of present illness.   All other ROS reviewed and negative.     Physical Exam/Data:   Vitals:   09/11/22 0804 09/11/22 0908 09/11/22 1026 09/11/22 1313  BP: 102/64   94/61  Pulse: 77     Resp: 20   19  Temp: 97.8 F (36.6 C)   98.1 F (36.7 C)  TempSrc: Axillary   Oral  SpO2: 95% 95% 97% 97%  Weight: 63.5 kg     Height: 5' 1.5" (1.562 m)       Intake/Output Summary (Last 24 hours) at 09/11/2022 1401 Last data filed at 09/10/2022 1800 Gross per 24 hour  Intake 481.87 ml  Output --  Net 481.87 ml      09/11/2022    8:04 AM 07/31/2022   10:04 AM 07/15/2022   11:29 AM  Last 3 Weights  Weight (lbs) 140 lb 140 lb 143 lb 6.4 oz  Weight (kg) 63.504 kg 63.504 kg 65.046 kg     Body mass index is 26.02 kg/m.  General:  Well nourished, well developed, in no acute distress HEENT: normal Neck:  no JVD Vascular: No carotid bruits; Distal pulses 2+ bilaterally Cardiac:  normal S1, S2; RRR; no murmur  Lungs:  significant pulmonary disease. Diffuse end-expiratory wheezing with middle/lower lobe crackles and rhonchi. Abd: soft, nontender, no hepatomegaly  Ext: no edema Musculoskeletal:  No deformities, BUE and BLE strength normal and equal Skin: warm and dry  Neuro:  CNs 2-12 intact, no focal abnormalities noted Psych:  Normal affect   EKG:  The EKG was personally reviewed and demonstrates:  ECG from yesterday shows tachycardia with new IVCD. Will repeat today.  Telemetry:  Telemetry was personally reviewed and demonstrates:  sinus rhythm  Relevant CV Studies:  09/20/21 TTE  IMPRESSIONS     1. Left ventricular ejection fraction, by estimation, is 60 to 65%. The  left ventricle has normal function. The left ventricle has no regional  wall motion  abnormalities. Left ventricular diastolic parameters were  normal.   2. Right ventricular systolic function is normal. The right ventricular  size is normal.   3. The mitral valve is normal in structure. No evidence of mitral valve  regurgitation. No evidence of mitral stenosis.   4. The aortic valve was not well visualized. There is mild calcification  of the aortic valve. Aortic valve regurgitation is mild to moderate.  Aortic valve sclerosis is present, with no evidence of aortic valve  stenosis.   5. The inferior vena cava is normal in size with greater than 50%  respiratory variability, suggesting right atrial pressure of 3 mmHg.   FINDINGS   Left Ventricle: Left ventricular ejection fraction, by estimation, is 60  to 65%. The left ventricle has normal function. The left ventricle has no  regional wall motion abnormalities. The left ventricular internal cavity  size was normal in size. There is   no left ventricular hypertrophy. Left ventricular diastolic parameters  were normal.   Right Ventricle: The right ventricular size is normal. No increase in  right ventricular wall thickness. Right ventricular systolic function is  normal.   Left Atrium: Left atrial size was normal in size.   Right Atrium: Right atrial size was normal in size.   Pericardium: There is no evidence of pericardial effusion.   Mitral Valve: The mitral valve is normal in structure. No evidence of  mitral valve stenosis.   Tricuspid Valve: The tricuspid valve is normal in structure. Tricuspid  valve regurgitation is not demonstrated. No evidence of tricuspid  stenosis.   Aortic Valve: The aortic valve was not well visualized. There is mild  calcification of the aortic valve. Aortic valve regurgitation is mild to  moderate. Aortic regurgitation PHT measures 380 msec. Aortic valve  sclerosis is present, with no evidence of  aortic valve stenosis.   Pulmonic Valve: The pulmonic valve was normal in  structure. Pulmonic valve  regurgitation is not visualized. No evidence of pulmonic stenosis.   Aorta: The aortic root is normal in size and structure.   Venous: The inferior vena cava is normal in size with greater than 50%  respiratory variability, suggesting right atrial pressure of 3 mmHg.   IAS/Shunts: No atrial level shunt detected by color flow Doppler.   Laboratory Data:  High Sensitivity Troponin:   Recent Labs  Lab 09/10/22 1101 09/10/22 1321 09/11/22 1216  TROPONINIHS 67* 720* 1,169*     Chemistry Recent Labs  Lab 09/10/22 1101 09/10/22 1106 09/10/22 1321 09/11/22 0147  NA 137 137  --  139  K 3.3* 3.4*  --  3.4*  CL 92*  --   --  98  CO2 31  --   --  28  GLUCOSE 353*  --   --  145*  BUN 33*  --   --  36*  CREATININE 1.62*  --   --  1.21*  CALCIUM 9.1  --   --  8.6*  MG  --   --  3.3*  --   GFRNONAA 34*  --   --  49*  ANIONGAP 14  --   --  13    Recent Labs  Lab 09/10/22 1101 09/11/22 0147  PROT 6.1* 5.3*  ALBUMIN 3.6 3.0*  AST 41 41  ALT 27 28  ALKPHOS 66 55  BILITOT 0.6 0.4   Lipids No results for input(s): "CHOL", "TRIG", "HDL", "LABVLDL", "LDLCALC", "CHOLHDL" in the last 168 hours.  Hematology Recent Labs  Lab 09/10/22 1101 09/10/22 1106 09/11/22 0147  WBC 26.6*  --  15.8*  RBC 4.04  --  3.52*  HGB 13.0 13.3 11.4*  HCT 41.8 39.0 35.1*  MCV 103.5*  --  99.7  MCH 32.2  --  32.4  MCHC 31.1  --  32.5  RDW 13.8  --  13.9  PLT 301  --  226   Thyroid No results for input(s): "TSH", "FREET4" in the last 168 hours.  BNP Recent Labs  Lab 09/10/22 1101  BNP 236.4*    DDimer No results for input(s): "DDIMER" in the last 168 hours.   Radiology/Studies:  DG Chest Port 1 View  Result Date: 09/10/2022 CLINICAL DATA:  Shortness of breath EXAM: PORTABLE CHEST 1 VIEW COMPARISON:  X-ray 09/08/2022 FINDINGS: Hyperinflation. Likely chronic interstitial lung changes. No consolidation, pneumothorax or effusion. No edema. Normal cardiopericardial  silhouette with a calcified aorta. Overlapping cardiac leads. IMPRESSION: No acute cardiopulmonary disease. Hyperinflation with chronic changes Electronically Signed   By: Karen Kays M.D.   On: 09/10/2022 11:20   DG Chest 2 View  Result Date: 09/08/2022 CLINICAL DATA:  Cough and shortness of breath. EXAM: CHEST - 2 VIEW COMPARISON:  CT 05/08/2022 FINDINGS: Heart size and mediastinal contours appear normal. Lungs are hyperinflated with chronic interstitial changes of emphysema. No superimposed pleural effusion, interstitial edema or airspace disease. IMPRESSION: 1. No active cardiopulmonary abnormalities. 2. Emphysema. Electronically Signed   By: Signa Kell M.D.   On: 09/08/2022 13:09     Assessment and Plan:  TAMBERLY TINSLEY is a 67 y.o. female with a hx of non-obstructive CAD, non-ischemic cardiomyopathy, severe COPD with chronic respiratory failure, hyperlipidemia, tobacco use who is being seen 09/11/2022 for the evaluation of elevated troponin at the request of Dr. Renford Dills.  Elevated troponin  Patient with prior LHC in 2022 negative for significant CAD. Mild plaquing of proximal LAD. Although delta troponin this admission 67->720 is notable, patient has previously had similar increase (74->389->756) in setting of COPD exacerbation.   Overall low suspicion for ACS. This presentation mirrors prior COPD exacerbation with concurrent stress cardiomyopathy. Type II MI with demand ischemia Repeat echocardiogram this admission.  Chronic diastolic CHF Hx stress induced cardiomyopthy  Patient with multiple prior occurrences of stress cardiomyopathy with EF as low as 20-25% in acute setting. LVEF 60-65% per June 2023 TTE. BNP this admission 236.4.  Patient appears euvolemic on physical exam today and is back on home O2 flow.  Suspect stress cardiomyopathy as above, repeat echocardiogram pending. No room to add GDMT with hypotension. Would plan to convert back to oral lasix tomorrow, 40mg .    Minimal CAD Hyperlipidemia  Continue risk factor  modification with ASA 81mg , Bisoprolol 5mg  (as tolerated by BP). Continue Zetia and Crestor.  Per primary team: Acute on chronic respiratory failure COPD exacerbation Anxiety/depression AKI  Risk Assessment/Risk Scores:     TIMI Risk Score for Unstable Angina or Non-ST Elevation MI:   The patient's TIMI risk score is 3, which indicates a 13% risk of all cause mortality, new or recurrent myocardial infarction or need for urgent revascularization in the next 14 days.  New York Heart Association (NYHA) Functional Class NYHA Class II (classification difficult due to severe COPD).         For questions or updates, please contact [facility] Please consult [email or web address] for contact info under    Signed, Perlie Gold, PA-C  09/11/2022 2:01 PM  Patient seen and examined and agree with Perlie Gold, PA-C as detailed above.   In brief, the patient is a 67 year old female with history of non-obstructive CAD, takostubo cardiomyopathy, severe COPD with chronic respiratory failure, HLD, and tobacco abuse who presents to clinic with acute COPD exacerbation with course complicated by elevated trop for which Cardiology was consulted.  Patient with history of NICM/takostubo CM in the past in the setting of COPD exacerbation. EF has dropped as low as 25-30% in 2017 with clean coronaries at that time that recovered to 55-60% in 2018. Had recurrent COPD exacerbation in 2022 where EF dropped again to 30-35%. Cath at that time also with clean coronaries and it was deemed a recurrent takostubo CM. Most recent TTE 09/2021 with interval improvement of EF to 60-65%.  She presents on this admission with worsening respiratory status found to have acute COPD exacerbation. Trop  67>120>1100. ECG with NSR, IVCD, poor r-wave progression (chronic). Of note, trop rise similar to that seen with prior takostubo episodes. Repeat TTE pending.  Overall,  low suspicion for underlying obstructive CAD. Concerned that she may have had recurrence of takostubo given that past episodes also occurred in the setting of acute COPD exacerbation. Will follow-up TTE to help guide further management. Management of COPD per primary team.  GEN: Dyspneic, speaking in short sentence  Neck: No JVD Cardiac: RRR, no murmurs, rubs, or gallops.  Respiratory: Diminished with diffuse wheezing, tachypneic GI: Soft, nontender, non-distended  MS: No edema; No deformity. Neuro:  Nonfocal  Psych: Normal affect    Plan: -Repeat TTE for monitoring -Low suspicion for true ACS as underlying etiology given recent negative cath in 2022 -BP too soft to add GDMT -Continue management of acute COPD per primary team  Laurance Flatten, MD

## 2022-09-11 NOTE — Progress Notes (Signed)
PHARMACY NOTE:  ANTIMICROBIAL RENAL DOSAGE ADJUSTMENT  Current antimicrobial regimen includes a mismatch between antimicrobial dosage and estimated renal function.  As per policy approved by the Pharmacy & Therapeutics and Medical Executive Committees, the antimicrobial dosage will be adjusted accordingly.  Current antimicrobial dosage:  levofloxacin 750mg  daily  Indication: COPD exacerbation  Renal Function:  Estimated Creatinine Clearance: 38.5 mL/min (A) (by C-G formula based on SCr of 1.21 mg/dL (H)). []      On intermittent HD, scheduled: []      On CRRT    Antimicrobial dosage has been changed to:  levofloxacin 750mg  Q48H   Thank you for allowing pharmacy to be a part of this patient's care.  Vernard Gambles, PharmD, BCPS  09/11/2022 8:09 AM

## 2022-09-11 NOTE — TOC Initial Note (Addendum)
Transition of Care [facility]) - Initial/Assessment Note    Patient Details  Name: Kim [name] MRN: [id number] Date of Birth: Aug 08, 1955  Transition of Care [facility]) CM/SW Contact:    Leone Haven, RN Phone Number: 09/11/2022, 1:43 PM  Clinical Narrative:                  From home with spouse, has home oxygen with adapt 2 liters, walker, transport chair and shower chair, bipap at home.  Spouse will transport her home at dc he is her support system.  She gets medications from [facility] on [address]  Patient will benefit from PT eval. If she needs HH she does not have a preference of the agency.       Patient Goals and CMS Choice            Expected Discharge Plan and Services                                              Prior Living Arrangements/Services                       Activities of Daily Living      Permission Sought/Granted                  Emotional Assessment              Admission diagnosis:  Acute respiratory failure with hypoxia (HCC) [J96.01] COPD with acute exacerbation (HCC) [J44.1] Acute on chronic respiratory failure with hypoxia (HCC) [J96.21] Patient Active Problem List   Diagnosis Date Noted   Acute on chronic respiratory failure with hypoxia and hypercapnia (HCC) 09/10/2022   Myalgia due to statin 06/05/2022   Medication reaction 02/12/2022   NSVT (nonsustained ventricular tachycardia) (HCC) 10/11/2021   Hyperkalemia 09/22/2021   SOB (shortness of breath) 09/19/2021   Chronic respiratory failure with hypoxia and hypercapnia (HCC)    DNR (do not resuscitate) 09/17/2021   COPD with acute exacerbation (HCC) 08/26/2021   Double vision 08/26/2021   AKI (acute kidney injury) (HCC) 08/26/2021   Postconcussive syndrome 08/26/2021   Elevated troponin 08/26/2021   Subarachnoid hemorrhage (HCC) 08/26/2021   Syncope and collapse 08/26/2021   Heart failure with preserved ejection fraction (HCC)  08/26/2021   NSTEMI (non-ST elevated myocardial infarction) (HCC) 11/17/2020   Osteoporosis 06/05/2020   Chronic combined systolic (congestive) and diastolic (congestive) heart failure (HCC) 03/24/2020   Essential hypertension 03/24/2020   Vertebral fracture, osteoporotic (HCC) 03/05/2020   History of colonic polyps    Benign neoplasm of ascending colon    Neuritis 01/29/2017   Alpha-1-antitrypsin deficiency carrier 01/19/2017   Dependent edema 01/14/2017   Orthostatic hypotension    Allergic rhinitis 08/10/2015   Hyperglycemia    HLD (hyperlipidemia)    Coronary artery calcification seen on CAT scan 07/02/2015   Solitary pulmonary nodule 12/02/2013   Loss of weight 11/10/2013   Osteoarthritis 06/05/2009   Brachial neuritis or radiculitis 06/05/2009   Dyslipidemia 03/12/2009   NEPHROLITHIASIS 01/05/2008   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   PARESTHESIA 08/24/2007   Anxiety and depression 11/19/2006   PCP:  Shirline Frees, NP Pharmacy:   [id number] [place], [place] - [address] AT [facility] [address] [facility] [address] [place] [address] Phone: [phone]  Fax: [phone]  [facility] - [place], [place] - [address] [place] [address] Phone: [phone] Fax: [phone]  [facility] - [address] [place] [address] Phone: [phone] Fax: [phone]  [facility] (Now [facility]) - [address] [place], [place] - [address] [place] [address] Phone: [phone] Fax: [phone]     Social Determinants of Health (SDOH) Social History: SDOH Screenings   Food Insecurity: No Food Insecurity (09/10/2021)  Housing: Low Risk  (09/10/2021)  Transportation Needs: No Transportation Needs (09/10/2021)  Alcohol Screen: Low Risk  (03/22/2020)  Depression (PHQ2-9): Medium Risk  (09/10/2021)  Financial Resource Strain: High Risk (09/10/2021)  Physical Activity: Inactive (03/25/2021)  Social Connections: Moderately Isolated (03/25/2021)  Stress: No Stress Concern Present (09/10/2021)  Tobacco Use: Medium Risk (09/10/2022)   SDOH Interventions:     Readmission Risk Interventions    09/20/2021    3:27 PM  Readmission Risk Prevention Plan  Transportation Screening Complete  Medication Review (RN Care Manager) Complete  PCP or Specialist appointment within 3-5 days of discharge Complete  HRI or Home Care Consult Complete  SW Recovery Care/Counseling Consult Complete  Palliative Care Screening Not Applicable  Skilled Nursing Facility Not Applicable

## 2022-09-11 NOTE — Progress Notes (Signed)
   09/11/22 2323  BiPAP/CPAP/SIPAP  $ Face Mask Small Yes  BiPAP/CPAP/SIPAP Pt Type Adult  BiPAP/CPAP/SIPAP V60  Mask Type Full face mask  Mask Size Small  Set Rate 10 breaths/min  Respiratory Rate 21 breaths/min  IPAP 10 cmH20  EPAP 8 cmH2O  FiO2 (%) 28 %  Minute Ventilation 10.8  Leak 11  Peak Inspiratory Pressure (PIP) 12  Tidal Volume (Vt) 386  Patient Home Equipment No  Auto Titrate No  Press High Alarm 25 cmH2O  Press Low Alarm 5 cmH2O  BiPAP/CPAP /SiPAP Vitals  Pulse Rate 77  Resp (!) 26  SpO2 97 %  Bilateral Breath Sounds Expiratory wheezes;Diminished  MEWS Score/Color  MEWS Score 2  MEWS Score Color Yellow

## 2022-09-12 ENCOUNTER — Other Ambulatory Visit (HOSPITAL_COMMUNITY): Payer: Self-pay

## 2022-09-12 DIAGNOSIS — J441 Chronic obstructive pulmonary disease with (acute) exacerbation: Secondary | ICD-10-CM | POA: Diagnosis not present

## 2022-09-12 DIAGNOSIS — I5042 Chronic combined systolic (congestive) and diastolic (congestive) heart failure: Secondary | ICD-10-CM | POA: Diagnosis not present

## 2022-09-12 DIAGNOSIS — J9621 Acute and chronic respiratory failure with hypoxia: Secondary | ICD-10-CM | POA: Diagnosis not present

## 2022-09-12 DIAGNOSIS — J9622 Acute and chronic respiratory failure with hypercapnia: Secondary | ICD-10-CM | POA: Diagnosis not present

## 2022-09-12 DIAGNOSIS — I2510 Atherosclerotic heart disease of native coronary artery without angina pectoris: Secondary | ICD-10-CM | POA: Diagnosis not present

## 2022-09-12 LAB — CBC
HCT: 34.3 % — ABNORMAL LOW (ref 36.0–46.0)
Hemoglobin: 10.9 g/dL — ABNORMAL LOW (ref 12.0–15.0)
MCH: 31.1 pg (ref 26.0–34.0)
MCHC: 31.8 g/dL (ref 30.0–36.0)
MCV: 98 fL (ref 80.0–100.0)
Platelets: 249 10*3/uL (ref 150–400)
RBC: 3.5 MIL/uL — ABNORMAL LOW (ref 3.87–5.11)
RDW: 13.7 % (ref 11.5–15.5)
WBC: 17.5 10*3/uL — ABNORMAL HIGH (ref 4.0–10.5)
nRBC: 0 % (ref 0.0–0.2)

## 2022-09-12 LAB — BASIC METABOLIC PANEL
Anion gap: 6 (ref 5–15)
BUN: 32 mg/dL — ABNORMAL HIGH (ref 8–23)
CO2: 30 mmol/L (ref 22–32)
Calcium: 8.9 mg/dL (ref 8.9–10.3)
Chloride: 104 mmol/L (ref 98–111)
Creatinine, Ser: 1.11 mg/dL — ABNORMAL HIGH (ref 0.44–1.00)
GFR, Estimated: 54 mL/min — ABNORMAL LOW (ref >60)
Glucose, Bld: 134 mg/dL — ABNORMAL HIGH (ref 70–99)
Potassium: 4.3 mmol/L (ref 3.5–5.1)
Sodium: 140 mmol/L (ref 135–145)

## 2022-09-12 MED ORDER — DAPAGLIFLOZIN PROPANEDIOL 10 MG PO TABS
10.0000 mg | ORAL_TABLET | Freq: Every day | ORAL | Status: DC
  Administered 2022-09-12 – 2022-09-18 (×7): 10 mg via ORAL
  Filled 2022-09-12 (×7): qty 1

## 2022-09-12 MED ORDER — GUAIFENESIN ER 600 MG PO TB12
600.0000 mg | ORAL_TABLET | Freq: Two times a day (BID) | ORAL | Status: DC
  Administered 2022-09-12 – 2022-09-18 (×13): 600 mg via ORAL
  Filled 2022-09-12 (×13): qty 1

## 2022-09-12 MED ORDER — POLYETHYLENE GLYCOL 3350 17 G PO PACK
17.0000 g | PACK | Freq: Every day | ORAL | Status: DC
  Administered 2022-09-12 – 2022-09-16 (×4): 17 g via ORAL
  Filled 2022-09-12 (×7): qty 1

## 2022-09-12 MED ORDER — ALBUTEROL SULFATE (2.5 MG/3ML) 0.083% IN NEBU
2.5000 mg | INHALATION_SOLUTION | RESPIRATORY_TRACT | Status: DC
  Administered 2022-09-12 – 2022-09-14 (×13): 2.5 mg via RESPIRATORY_TRACT
  Filled 2022-09-12 (×13): qty 3

## 2022-09-12 MED ORDER — ALPRAZOLAM 0.5 MG PO TABS
0.5000 mg | ORAL_TABLET | Freq: Four times a day (QID) | ORAL | Status: DC
  Administered 2022-09-12 – 2022-09-13 (×3): 0.5 mg via ORAL
  Filled 2022-09-12 (×4): qty 1

## 2022-09-12 MED ORDER — SPIRONOLACTONE 12.5 MG HALF TABLET
12.5000 mg | ORAL_TABLET | Freq: Every day | ORAL | Status: DC
  Administered 2022-09-12 – 2022-09-18 (×7): 12.5 mg via ORAL
  Filled 2022-09-12 (×7): qty 1

## 2022-09-12 NOTE — Progress Notes (Signed)
Rounding Note    Patient Name: Kim [name] Date of Encounter: 09/12/2022   HeartCare Cardiologist: Verne Carrow, MD   Subjective   Continues to have significant SOB. No chest pain.  TTE yesterday with LVEF ~35% with anterior, septal and apical WMA most likely consistent with recurrent takostubo pattern   Inpatient Medications    Scheduled Meds:  albuterol  2.5 mg Nebulization Q4H   albuterol  15 mg/hr Nebulization Once   arformoterol  15 mcg Nebulization BID   aspirin EC  81 mg Oral Daily   budesonide (PULMICORT) nebulizer solution  0.5 mg Nebulization BID   enoxaparin (LOVENOX) injection  40 mg Subcutaneous Q24H   ezetimibe  10 mg Oral Daily   levofloxacin  750 mg Oral Q48H   methylPREDNISolone (SOLU-MEDROL) injection  80 mg Intravenous Q24H   PARoxetine  40 mg Oral Daily   revefenacin  175 mcg Nebulization Daily   rosuvastatin  20 mg Oral Daily   sodium chloride flush  3 mL Intravenous Q12H   Continuous Infusions:  PRN Meds: albuterol, albuterol, ALPRAZolam, HYDROcodone-acetaminophen   Vital Signs    Vitals:   09/12/22 0412 09/12/22 0432 09/12/22 0433 09/12/22 0714  BP:   127/75 (!) 104/92  Pulse: 76   82  Resp: (!) 28 20 18 20   Temp:   98.3 F (36.8 C) 97.8 F (36.6 C)  TempSrc:   Oral Oral  SpO2: 100% 100% 100% 99%  Weight:   65.8 kg   Height:        Intake/Output Summary (Last 24 hours) at 09/12/2022 0813 Last data filed at 09/12/2022 0439 Gross per 24 hour  Intake 520 ml  Output 500 ml  Net 20 ml      09/12/2022    4:33 AM 09/11/2022    8:04 AM 07/31/2022   10:04 AM  Last 3 Weights  Weight (lbs) 145 lb 1 oz 140 lb 140 lb  Weight (kg) 65.8 kg 63.504 kg 63.504 kg      Telemetry    NSR - Personally Reviewed  ECG    NSR, LAFB, poor r-wave progression  - Personally Reviewed  Physical Exam   GEN: Dyspneic, speaking in short sentence   Neck: No JVD Cardiac: RRR, no murmurs, rubs, or gallops.  Respiratory: Diminished  with poor air movement. Dyspneic GI: Soft, nontender, non-distended  MS: No edema; No deformity. Neuro:  Nonfocal  Psych: Normal affect   Labs    High Sensitivity Troponin:   Recent Labs  Lab 09/10/22 1101 09/10/22 1321 09/11/22 1216 09/11/22 1336  TROPONINIHS 67* 720* 1,169* 876*     Chemistry Recent Labs  Lab 09/10/22 1101 09/10/22 1106 09/10/22 1321 09/11/22 0147 09/12/22 0120  NA 137 137  --  139 140  K 3.3* 3.4*  --  3.4* 4.3  CL 92*  --   --  98 104  CO2 31  --   --  28 30  GLUCOSE 353*  --   --  145* 134*  BUN 33*  --   --  36* 32*  CREATININE 1.62*  --   --  1.21* 1.11*  CALCIUM 9.1  --   --  8.6* 8.9  MG  --   --  3.3*  --   --   PROT 6.1*  --   --  5.3*  --   ALBUMIN 3.6  --   --  3.0*  --   AST 41  --   --  41  --   ALT 27  --   --  28  --   ALKPHOS 66  --   --  55  --   BILITOT 0.6  --   --  0.4  --   GFRNONAA 34*  --   --  49* 54*  ANIONGAP 14  --   --  13 6    Lipids No results for input(s): "CHOL", "TRIG", "HDL", "LABVLDL", "LDLCALC", "CHOLHDL" in the last 168 hours.  Hematology Recent Labs  Lab 09/10/22 1101 09/10/22 1106 09/11/22 0147 09/12/22 0120  WBC 26.6*  --  15.8* 17.5*  RBC 4.04  --  3.52* 3.50*  HGB 13.0 13.3 11.4* 10.9*  HCT 41.8 39.0 35.1* 34.3*  MCV 103.5*  --  99.7 98.0  MCH 32.2  --  32.4 31.1  MCHC 31.1  --  32.5 31.8  RDW 13.8  --  13.9 13.7  PLT 301  --  226 249   Thyroid No results for input(s): "TSH", "FREET4" in the last 168 hours.  BNP Recent Labs  Lab 09/10/22 1101  BNP 236.4*    DDimer No results for input(s): "DDIMER" in the last 168 hours.   Radiology    ECHOCARDIOGRAM COMPLETE  Result Date: 09/11/2022    ECHOCARDIOGRAM REPORT   Patient Name:   Kim [name] Date of Exam: 09/11/2022 Medical Rec #:  [id number]       Height:       61.5 in Accession #:    [id number]      Weight:       140.0 lb Date of Birth:  13-Feb-1956      BSA:          1.633 m Patient Age:    67 years        BP:           94/61 mmHg  Patient Gender: F               HR:           93 bpm. Exam Location:  Inpatient Procedure: 2D Echo, Color Doppler and Cardiac Doppler Indications:    Elevated Troponin  History:        Patient has prior history of Echocardiogram examinations, most                 recent 09/20/2021. CHF, Previous Myocardial Infarction, COPD,                 Signs/Symptoms:Shortness of Breath, Chest Pain and Syncope; Risk                 Factors:Hypertension and Dyslipidemia.  Sonographer:    Darlys Gales Referring Phys: [id number] Perlie Gold IMPRESSIONS  1. Poor endocardial visualization and no definity makes estimation of EF difficult Septum and apex as well as anterior wall hypokinetic. Left ventricular ejection fraction, by estimation, is 35 to 40%. The left ventricle has moderately decreased function. The left ventricle has no regional wall motion abnormalities. The left ventricular internal cavity size was moderately dilated. Left ventricular diastolic parameters are consistent with Grade I diastolic dysfunction (impaired relaxation).  2. Right ventricular systolic function is normal. The right ventricular size is normal.  3. The mitral valve is normal in structure. No evidence of mitral valve regurgitation. No evidence of mitral stenosis.  4. The aortic valve was not well visualized. Aortic valve regurgitation is mild. Aortic valve sclerosis is present, with no evidence of aortic valve stenosis.  5. The inferior vena cava is normal in size with greater than 50% respiratory variability, suggesting right atrial pressure of 3 mmHg. FINDINGS  Left Ventricle: Poor endocardial visualization and no definity makes estimation of EF difficult Septum and apex as well as anterior wall hypokinetic. Left ventricular ejection fraction, by estimation, is 35 to 40%. The left ventricle has moderately decreased function. The left ventricle has no regional wall motion abnormalities. The left ventricular internal cavity size was moderately dilated.  There is no left ventricular hypertrophy. Left ventricular diastolic parameters are consistent with Grade I diastolic dysfunction (impaired relaxation). Right Ventricle: The right ventricular size is normal. No increase in right ventricular wall thickness. Right ventricular systolic function is normal. Left Atrium: Left atrial size was normal in size. Right Atrium: Right atrial size was normal in size. Pericardium: There is no evidence of pericardial effusion. Mitral Valve: The mitral valve is normal in structure. No evidence of mitral valve regurgitation. No evidence of mitral valve stenosis. Tricuspid Valve: The tricuspid valve is normal in structure. Tricuspid valve regurgitation is not demonstrated. No evidence of tricuspid stenosis. Aortic Valve: The aortic valve was not well visualized. Aortic valve regurgitation is mild. Aortic regurgitation PHT measures 337 msec. Aortic valve sclerosis is present, with no evidence of aortic valve stenosis. Aortic valve mean gradient measures 5.0 mmHg. Aortic valve peak gradient measures 8.4 mmHg. Aortic valve area, by VTI measures 2.57 cm. Pulmonic Valve: The pulmonic valve was normal in structure. Pulmonic valve regurgitation is not visualized. No evidence of pulmonic stenosis. Aorta: The aortic root is normal in size and structure. Venous: The inferior vena cava is normal in size with greater than 50% respiratory variability, suggesting right atrial pressure of 3 mmHg. IAS/Shunts: The interatrial septum was not well visualized.  LEFT VENTRICLE PLAX 2D LVIDd:         5.40 cm   Diastology LVIDs:         3.90 cm   LV e' medial:    4.35 cm/s LV PW:         0.80 cm   LV E/e' medial:  18.9 LV IVS:        0.80 cm   LV e' lateral:   8.70 cm/s LVOT diam:     1.80 cm   LV E/e' lateral: 9.4 LV SV:         65 LV SV Index:   40 LVOT Area:     2.54 cm  RIGHT VENTRICLE RV S prime:     11.40 cm/s TAPSE (M-mode): 1.5 cm LEFT ATRIUM             Index        RIGHT ATRIUM          Index LA  Vol (A2C):   15.3 ml 9.37 ml/m   RA Area:     7.20 cm LA Vol (A4C):   22.8 ml 13.96 ml/m  RA Volume:   11.70 ml 7.16 ml/m LA Biplane Vol: 20.1 ml 12.31 ml/m  AORTIC VALVE AV Area (Vmax):    2.56 cm AV Area (Vmean):   2.11 cm AV Area (VTI):     2.57 cm AV Vmax:           145.00 cm/s AV Vmean:          106.000 cm/s AV VTI:            0.253 m AV Peak Grad:      8.4 mmHg AV Mean Grad:  5.0 mmHg LVOT Vmax:         146.00 cm/s LVOT Vmean:        88.100 cm/s LVOT VTI:          0.256 m LVOT/AV VTI ratio: 1.01 AI PHT:            337 msec  AORTA Ao Root diam: 2.40 cm MITRAL VALVE MV Area (PHT): 4.93 cm     SHUNTS MV Decel Time: 154 msec     Systemic VTI:  0.26 m MV E velocity: 82.10 cm/s   Systemic Diam: 1.80 cm MV A velocity: 107.00 cm/s MV E/A ratio:  0.77 Charlton Haws MD Electronically signed by Charlton Haws MD Signature Date/Time: 09/11/2022/3:30:55 PM    Final    DG Chest Port 1 View  Result Date: 09/10/2022 CLINICAL DATA:  Shortness of breath EXAM: PORTABLE CHEST 1 VIEW COMPARISON:  X-ray 09/08/2022 FINDINGS: Hyperinflation. Likely chronic interstitial lung changes. No consolidation, pneumothorax or effusion. No edema. Normal cardiopericardial silhouette with a calcified aorta. Overlapping cardiac leads. IMPRESSION: No acute cardiopulmonary disease. Hyperinflation with chronic changes Electronically Signed   By: Karen Kays M.D.   On: 09/10/2022 11:20    Cardiac Studies  TTE 09/11/22: IMPRESSIONS     1. Poor endocardial visualization and no definity makes estimation of EF  difficult Septum and apex as well as anterior wall hypokinetic. Left  ventricular ejection fraction, by estimation, is 35 to 40%. The left  ventricle has moderately decreased  function. The left ventricle has no regional wall motion abnormalities.  The left ventricular internal cavity size was moderately dilated. Left  ventricular diastolic parameters are consistent with Grade I diastolic  dysfunction (impaired  relaxation).   2. Right ventricular systolic function is normal. The right ventricular  size is normal.   3. The mitral valve is normal in structure. No evidence of mitral valve  regurgitation. No evidence of mitral stenosis.   4. The aortic valve was not well visualized. Aortic valve regurgitation  is mild. Aortic valve sclerosis is present, with no evidence of aortic  valve stenosis.   5. The inferior vena cava is normal in size with greater than 50%  respiratory variability, suggesting right atrial pressure of 3 mmHg.     Patient Profile     67 y.o. female with history of non-obstructive CAD, takostubo cardiomyopathy, severe COPD with chronic respiratory failure, HLD, and tobacco abuse who presents to clinic with acute COPD exacerbation with course complicated by elevated trop for which Cardiology was consulted.   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: #Acute on Chronic Systolic HF: -Patient with history of takotsubo CM in the setting of acute COPD exacerbation in 2017 and 2022 with cath at both times with minimal disease -TTE recovered after both episodes from 25-30% in 2017 to 55-60% in 2018 and then from 30-35% in 2022 to 60-65% in 09/2021 -She now presents with acute COPD exacerbation with elevated troponin similar to prior events and EF has dropped again to 35% with anterior, septal and apex WMA -Given recent negative cath in 2022 and history of takostubox2 in the setting of COPD exacerbation as detailed above, this is likely recurrent takostubo CM -Will manage medically at this time with no plans for ischemic work-up given reassuring cath in 2022 unless EF does not recover once patient is more stable from a COPD standpoint -Could also consider coronary CTA prior to discharge -Start farxiga 10mg  daily -Start spiro 12.5mg  daily -No BB due  to severe COPD -BP too soft to add ARNI  #NSTEMI: -Trop peak 1169 -Suspect this is secondary to recurrent  takostubo as detailed above with no anginal symptoms and minimal disease on cath in 2022 -Will continue medical management of HFrEF as above -If EF remains depressed despite improvement from a COPD exacerbation, can plan for repeat ischemic evaluation at that time -Continue ASA 81mg  daily, crestor 20mg  daily, zetia 10mg  daily  #Acute COPD Exacerbation: -Management per primary  #HLD: -Continue crestor 20mg  daily, zetia 10mg  daily       For questions or updates, please contact [facility] Please consult [email or web address] for contact info under        Signed, Meriam Sprague, MD  09/12/2022, 8:13 AM

## 2022-09-12 NOTE — Progress Notes (Addendum)
   Heart Failure Stewardship Pharmacist Progress Note   PCP: Shirline Frees, NP PCP-Cardiologist: Verne Carrow, MD   HPI:  [name] with PMH of CHF with recovered EF, COPD on chronic O2, HTN, CAD, SAH, and orthostatic hypotension.  She presented to the ED on 6/5 with shortness of breath and as code STEMI. Per MD, STEMI cancelled. She was seen in urgent care 2 days prior and was given a Z-pack. In the ED, was tachycardic, CXR with hyperinflation but no acute abnormality, BNP elevated, troponin elevated. Cardiology and pulmonary have been consulted. Prior ECHO in 09/2021 with EF 60-65% (improved from 35-40% in 08/2021 due to stress cardiomyopathy). ECHO 6/6 showed LVEF 35-40%, no regional wall motion abnormalities, G1DD, RV normal. Findings most likely consistent with recurrent Takotsubo pattern.   Current HF Medications: MRA: spironolactone 12.5 mg daily SGLT2i: Farxiga 10 mg daily  Prior to admission HF Medications: Diuretic: furosemide 40 mg daily Beta blocker: bisoprolol 2.5 mg daily PRN for breakthrough chest pain  Pertinent Lab Values: Serum creatinine 1.11, BUN 32, Potassium 4.3, Sodium 140, BNP 236.4, Magnesium 3.3, A1c 6.4   Vital Signs: Weight: 145 lbs  Blood pressure: 100/90-130/70s  Heart rate: 70-80s  I/O: net +0.5L  Medication Assistance / Insurance Benefits Check: Does the patient have prescription insurance?  Yes Type of insurance plan: BCBS Medicare  Does the patient qualify for medication assistance through manufacturers or grants?   Yes Eligible grants and/or patient assistance programs: Farxiga Medication assistance applications in progress: Farxiga  Medication assistance applications approved: none Approved medication assistance renewals will be completed by: pending  Outpatient Pharmacy:  Prior to admission outpatient pharmacy: [facility] Is the patient willing to use [facility] TOC pharmacy at discharge? Yes Is the patient willing to transition their  outpatient pharmacy to utilize a [facility] outpatient pharmacy?   No   Assessment: 1. Acute on chronic systolic CHF (LVEF 35-40%), due to recurrent Takotsubo cardiomyopathy. NYHA class II symptoms. - Does not appear volume overloaded on exam, shortness of breath thought to be related to COPD. Off IV lasix. Strict I/Os and daily weights. Keep K>4 and Mg>2. - Consider scheduling bisoprolol 2.5 mg daily at discharge rather than resuming PRN if able. May be challenging with history of orthostatic hypotension, dizziness, and falls.  - BP too variable for ARB/ARNI - Agree with adding spironolactone 12.5 mg daily - Agree with adding Farxiga 10 mg daily  Plan: 1) Medication changes recommended at this time: - Agree with changes  2) Patient assistance: - Has $545 left on deductible - Initial copay for Farxiga $202 - starting patient assistance application today  3)  Education  - Patient has been educated on current HF medications and potential additions to HF medication regimen - Patient verbalizes understanding that over the next few months, these medication doses may change and more medications may be added to optimize HF regimen - Patient has been educated on basic disease state pathophysiology and goals of therapy   Sharen Hones, PharmD, BCPS Heart Failure Stewardship Pharmacist Phone [phone]

## 2022-09-12 NOTE — Progress Notes (Signed)
PROGRESS NOTE  Kim [name]  UJW:[id number] DOB: 03/17/1956 DOA: 09/10/2022 PCP: Shirline Frees, NP   Brief Narrative: Patient is a 67 year old female with history of nonobstructive coronary disease, hypertension, hyperlipidemia, diastolic CHF, stress-induced cardiomyopathy, COPD, chronic hypoxic respiratory failure on oxygen at home at 2 L presented with shortness of breath.  She also uses BiPAP/ventilation at night.  Patient was also taking prednisone which was prescribed as needed but it did not help.  She was  he was seen at urgent care on  2 days ago,found to have clear X-ray and was prescribed Z-Pak.  Patient presented to the emergency department because of worsening shortness of breath.  EMS put on nonrebreather.  Given bronchodilator treatment and Solu-Medrol.  On presentation, blood pressure was stable.  She was put on BiPAP.  Lab work showed potassium 3.3, creatinine of 1.6, WBC count of 26.6, hemoglobin, troponin 67 which increased to 700s.  Cardiology /pulmonology consulted and following and currently being managed for severe COPD exacerbation  Assessment & Plan:  Principal Problem:   Acute on chronic respiratory failure with hypoxia and hypercapnia (HCC) Active Problems:   COPD with acute exacerbation (HCC)   Chronic combined systolic (congestive) and diastolic (congestive) heart failure (HCC)   Anxiety and depression   Essential hypertension   AKI (acute kidney injury) (HCC)   Dyslipidemia   Coronary artery calcification seen on CAT scan   HLD (hyperlipidemia)   Heart failure with preserved ejection fraction (HCC)   COPD exacerbation (HCC)   Acute on chronic hypoxic respiratory failure secondary to COPD exacerbation: Presented with worsening shortness of breath for last 4 days, not improving with prednisone and Z-Pak.  Wheezing on presentation with leukocytosis.  Respiratory viral panel negative, COVID-negative.  She is on chronic prednisone therapy at home and also on BiPAP/  NIV at night. Started on steroid, antibiotic, bronchodilators.We started her on on yupleri,brovana.  Continue Pulmicort.  .  Pulmonology following. Presented with leukocytosis. This is most likely secondary to steroids.  Will continue antibiotic therapy.  AKI/hypokalemia: Baseline creatinine around 1.1.  Presented with creatinine of 1.6.  Potassium being supplemented.  Kidney function close to baseline.  Takotsubo cardiomyopathy/systolic CHF: Also has history of stress-induced cardiomyopathy.  Had low EF of 15 to 20% in 2017 but it had improved to 60 to 65% as per recent echo.  Elevated BNP.  Echo done on this admission showed EF of 35 to 40%.  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  Cardiology following.  Started on spironolactone, Farxiga.  Cardiology considering cardiac cath when her respiratory status is stable  Elevated troponin: Denies any chest pain but says her chest is heavy.  There was concern for NSTEMI on presentation.  Troponin  trended up from 60 to 1169. This is most likely from demand ischemia/cardiomyopathy.  Cardiology following.  Continue management as above  Hypertension: Currently blood pressure soft.  Bisoprolol on hold  Hyperlipidemia: On Zetia, rosuvastatin  Anxiety/depression: On paxil, Xanax  Hypokalemia: Supplemented and corrected  Severe anxiety: Continue Xanax as needed.  This is complicating her dyspnea  Weakness/debility: Will consult PT/OT when  appropriate       DVT prophylaxis:enoxaparin (LOVENOX) injection 40 mg Start: 09/10/22 1300     Code Status: DNR  Family Communication: Husband at bedside  Patient status:Inpatient  Patient is from :Home  Anticipated discharge FA:OZHY  Estimated DC date:2-3 days   Consultants: Cardiology, PCCM  Procedures:None  Antimicrobials:  Anti-infectives (From admission, onward)    Start     Dose/Rate Route  Frequency Ordered Stop   09/11/22 1000  levofloxacin (LEVAQUIN) tablet 750 mg        750 mg  Oral Every 48 hours 09/11/22 0802         Subjective: Patient seen and examined the bedside today.  She was maintaining her saturation on 2 L of oxygen.  Husband at bedside.  She used BiPAP last night.  Continues to complain of feeling of dyspnea.  Appears very anxious.  We discussed about use of flutter valve, incentive spirometer.  Objective: Vitals:   09/12/22 0432 09/12/22 0433 09/12/22 0714 09/12/22 1040  BP:  127/75 (!) 104/92   Pulse:   82 94  Resp: 20 18 20  (!) 24  Temp:  98.3 F (36.8 C) 97.8 F (36.6 C)   TempSrc:  Oral Oral   SpO2: 100% 100% 99%   Weight:  65.8 kg    Height:        Intake/Output Summary (Last 24 hours) at 09/12/2022 1309 Last data filed at 09/12/2022 0439 Gross per 24 hour  Intake 520 ml  Output 500 ml  Net 20 ml   Filed Weights   09/11/22 0804 09/12/22 0433  Weight: 63.5 kg 65.8 kg    Examination:   General exam: Appears short of breath, anxious, weak, deconditioned HEENT: PERRL Respiratory system: Severely diminished sounds bilaterally, expiratory wheezing Cardiovascular system: S1 & S2 heard, RRR.  Gastrointestinal system: Abdomen is nondistended, soft and nontender. Central nervous system: Alert and oriented Extremities: No edema, no clubbing ,no cyanosis Skin: No rashes, no ulcers,no icterus     Data Reviewed: I have personally reviewed following labs and imaging studies  CBC: Recent Labs  Lab 09/10/22 1101 09/10/22 1106 09/11/22 0147 09/12/22 0120  WBC 26.6*  --  15.8* 17.5*  NEUTROABS 14.4*  --   --   --   HGB 13.0 13.3 11.4* 10.9*  HCT 41.8 39.0 35.1* 34.3*  MCV 103.5*  --  99.7 98.0  PLT 301  --  226 249   Basic Metabolic Panel: Recent Labs  Lab 09/10/22 1101 09/10/22 1106 09/10/22 1321 09/11/22 0147 09/12/22 0120  NA 137 137  --  139 140  K 3.3* 3.4*  --  3.4* 4.3  CL 92*  --   --  98 104  CO2 31  --   --  28 30  GLUCOSE 353*  --   --  145* 134*  BUN 33*  --   --  36* 32*  CREATININE 1.62*  --   --  1.21*  1.11*  CALCIUM 9.1  --   --  8.6* 8.9  MG  --   --  3.3*  --   --      Recent Results (from the past 240 hour(s))  Respiratory (~20 pathogens) panel by PCR     Status: None   Collection Time: 09/10/22 12:41 PM   Specimen: Nasopharyngeal Swab; Respiratory  Result Value Ref Range Status   Adenovirus NOT DETECTED NOT DETECTED Final   Coronavirus 229E NOT DETECTED NOT DETECTED Final    Comment: (NOTE) The Coronavirus on the Respiratory Panel, DOES NOT test for the novel  Coronavirus (2019 nCoV)    Coronavirus HKU1 NOT DETECTED NOT DETECTED Final   Coronavirus NL63 NOT DETECTED NOT DETECTED Final   Coronavirus OC43 NOT DETECTED NOT DETECTED Final   Metapneumovirus NOT DETECTED NOT DETECTED Final   Rhinovirus / Enterovirus NOT DETECTED NOT DETECTED Final   Influenza A NOT DETECTED NOT DETECTED Final  Influenza B NOT DETECTED NOT DETECTED Final   Parainfluenza Virus 1 NOT DETECTED NOT DETECTED Final   Parainfluenza Virus 2 NOT DETECTED NOT DETECTED Final   Parainfluenza Virus 3 NOT DETECTED NOT DETECTED Final   Parainfluenza Virus 4 NOT DETECTED NOT DETECTED Final   Respiratory Syncytial Virus NOT DETECTED NOT DETECTED Final   Bordetella pertussis NOT DETECTED NOT DETECTED Final   Bordetella Parapertussis NOT DETECTED NOT DETECTED Final   Chlamydophila pneumoniae NOT DETECTED NOT DETECTED Final   Mycoplasma pneumoniae NOT DETECTED NOT DETECTED Final    Comment: Performed at [facility], [address] [place], [place] [address]  Resp Panel by RT-PCR (Flu A&B, Covid)     Status: None   Collection Time: 09/10/22 12:42 PM  Result Value Ref Range Status   SARS Coronavirus 2 by RT PCR NEGATIVE NEGATIVE Final   Influenza A by PCR NEGATIVE NEGATIVE Final   Influenza B by PCR NEGATIVE NEGATIVE Final    Comment: (NOTE) The Xpert Xpress SARS-CoV-2/FLU/RSV plus assay is intended as an aid in the diagnosis of influenza from Nasopharyngeal swab specimens and should not be used as a  sole basis for treatment. Nasal washings and aspirates are unacceptable for Xpert Xpress SARS-CoV-2/FLU/RSV testing.  Fact Sheet for Patients: BloggerCourse.com  Fact Sheet for Healthcare Providers: SeriousBroker.it  This test is not yet approved or cleared by the [place] FDA and has been authorized for detection and/or diagnosis of SARS-CoV-2 by FDA under an Emergency Use Authorization (EUA). This EUA will remain in effect (meaning this test can be used) for the duration of the COVID-19 declaration under Section 564(b)(1) of the Act, 21 U.S.C. section 360bbb-3(b)(1), unless the authorization is terminated or revoked.  Performed at [facility], [address] [place], [place] [address]      Radiology Studies: ECHOCARDIOGRAM COMPLETE  Result Date: 09/11/2022    ECHOCARDIOGRAM REPORT   Patient Name:   DERIAN URDAHL Date of Exam: 09/11/2022 Medical Rec #:  [id number]       Height:       61.5 in Accession #:    [id number]      Weight:       140.0 lb Date of Birth:  1955/08/07      BSA:          1.633 m Patient Age:    66 years        BP:           94/61 mmHg Patient Gender: F               HR:           93 bpm. Exam Location:  Inpatient Procedure: 2D Echo, Color Doppler and Cardiac Doppler Indications:    Elevated Troponin  History:        Patient has prior history of Echocardiogram examinations, most                 recent 09/20/2021. CHF, Previous Myocardial Infarction, COPD,                 Signs/Symptoms:Shortness of Breath, Chest Pain and Syncope; Risk                 Factors:Hypertension and Dyslipidemia.  Sonographer:    Darlys Gales Referring Phys: [id number] Perlie Gold IMPRESSIONS  1. Poor endocardial visualization and no definity makes estimation of EF difficult Septum and apex as well as anterior wall hypokinetic. Left ventricular ejection fraction, by  estimation, is 35 to 40%. The left ventricle has moderately decreased  function. The left ventricle has no regional wall motion abnormalities. The left ventricular internal cavity size was moderately dilated. Left ventricular diastolic parameters are consistent with Grade I diastolic dysfunction (impaired relaxation).  2. Right ventricular systolic function is normal. The right ventricular size is normal.  3. The mitral valve is normal in structure. No evidence of mitral valve regurgitation. No evidence of mitral stenosis.  4. The aortic valve was not well visualized. Aortic valve regurgitation is mild. Aortic valve sclerosis is present, with no evidence of aortic valve stenosis.  5. The inferior vena cava is normal in size with greater than 50% respiratory variability, suggesting right atrial pressure of 3 mmHg. FINDINGS  Left Ventricle: Poor endocardial visualization and no definity makes estimation of EF difficult Septum and apex as well as anterior wall hypokinetic. Left ventricular ejection fraction, by estimation, is 35 to 40%. The left ventricle has moderately decreased function. The left ventricle has no regional wall motion abnormalities. The left ventricular internal cavity size was moderately dilated. There is no left ventricular hypertrophy. Left ventricular diastolic parameters are consistent with Grade I diastolic dysfunction (impaired relaxation). Right Ventricle: The right ventricular size is normal. No increase in right ventricular wall thickness. Right ventricular systolic function is normal. Left Atrium: Left atrial size was normal in size. Right Atrium: Right atrial size was normal in size. Pericardium: There is no evidence of pericardial effusion. Mitral Valve: The mitral valve is normal in structure. No evidence of mitral valve regurgitation. No evidence of mitral valve stenosis. Tricuspid Valve: The tricuspid valve is normal in structure. Tricuspid valve regurgitation is not demonstrated. No evidence of tricuspid stenosis. Aortic Valve: The aortic valve was not  well visualized. Aortic valve regurgitation is mild. Aortic regurgitation PHT measures 337 msec. Aortic valve sclerosis is present, with no evidence of aortic valve stenosis. Aortic valve mean gradient measures 5.0 mmHg. Aortic valve peak gradient measures 8.4 mmHg. Aortic valve area, by VTI measures 2.57 cm. Pulmonic Valve: The pulmonic valve was normal in structure. Pulmonic valve regurgitation is not visualized. No evidence of pulmonic stenosis. Aorta: The aortic root is normal in size and structure. Venous: The inferior vena cava is normal in size with greater than 50% respiratory variability, suggesting right atrial pressure of 3 mmHg. IAS/Shunts: The interatrial septum was not well visualized.  LEFT VENTRICLE PLAX 2D LVIDd:         5.40 cm   Diastology LVIDs:         3.90 cm   LV e' medial:    4.35 cm/s LV PW:         0.80 cm   LV E/e' medial:  18.9 LV IVS:        0.80 cm   LV e' lateral:   8.70 cm/s LVOT diam:     1.80 cm   LV E/e' lateral: 9.4 LV SV:         65 LV SV Index:   40 LVOT Area:     2.54 cm  RIGHT VENTRICLE RV S prime:     11.40 cm/s TAPSE (M-mode): 1.5 cm LEFT ATRIUM             Index        RIGHT ATRIUM          Index LA Vol (A2C):   15.3 ml 9.37 ml/m   RA Area:     7.20 cm LA Vol (A4C):  22.8 ml 13.96 ml/m  RA Volume:   11.70 ml 7.16 ml/m LA Biplane Vol: 20.1 ml 12.31 ml/m  AORTIC VALVE AV Area (Vmax):    2.56 cm AV Area (Vmean):   2.11 cm AV Area (VTI):     2.57 cm AV Vmax:           145.00 cm/s AV Vmean:          106.000 cm/s AV VTI:            0.253 m AV Peak Grad:      8.4 mmHg AV Mean Grad:      5.0 mmHg LVOT Vmax:         146.00 cm/s LVOT Vmean:        88.100 cm/s LVOT VTI:          0.256 m LVOT/AV VTI ratio: 1.01 AI PHT:            337 msec  AORTA Ao Root diam: 2.40 cm MITRAL VALVE MV Area (PHT): 4.93 cm     SHUNTS MV Decel Time: 154 msec     Systemic VTI:  0.26 m MV E velocity: 82.10 cm/s   Systemic Diam: 1.80 cm MV A velocity: 107.00 cm/s MV E/A ratio:  0.77 Charlton Haws MD  Electronically signed by Charlton Haws MD Signature Date/Time: 09/11/2022/3:30:55 PM    Final     Scheduled Meds:  albuterol  2.5 mg Nebulization Q4H   albuterol  15 mg/hr Nebulization Once   ALPRAZolam  0.5 mg Oral Q6H WA   arformoterol  15 mcg Nebulization BID   aspirin EC  81 mg Oral Daily   budesonide (PULMICORT) nebulizer solution  0.5 mg Nebulization BID   dapagliflozin propanediol  10 mg Oral Daily   enoxaparin (LOVENOX) injection  40 mg Subcutaneous Q24H   ezetimibe  10 mg Oral Daily   guaiFENesin  600 mg Oral BID   levofloxacin  750 mg Oral Q48H   methylPREDNISolone (SOLU-MEDROL) injection  80 mg Intravenous Q24H   PARoxetine  40 mg Oral Daily   revefenacin  175 mcg Nebulization Daily   rosuvastatin  20 mg Oral Daily   sodium chloride flush  3 mL Intravenous Q12H   spironolactone  12.5 mg Oral Daily   Continuous Infusions:   LOS: 1 day   Burnadette Pop, MD [facility] P6/10/2022, 1:09 PM

## 2022-09-12 NOTE — Progress Notes (Signed)
   09/12/22 1402  Assess: MEWS Score  Temp 97.8 F (36.6 C)  BP 128/75  MAP (mmHg) 91  Pulse Rate 94  ECG Heart Rate 89  Resp (!) 30  SpO2 97 %  Assess: MEWS Score  MEWS Temp 0  MEWS Systolic 0  MEWS Pulse 0  MEWS RR 2  MEWS LOC 0  MEWS Score 2  MEWS Score Color Yellow  Assess: if the MEWS score is Yellow or Red  Were vital signs taken at a resting state? Yes  Focused Assessment No change from prior assessment  Does the patient meet 2 or more of the SIRS criteria? No  Does the patient have a confirmed or suspected source of infection? No  MEWS guidelines implemented  Yes, yellow  Treat  MEWS Interventions Considered administering scheduled or prn medications/treatments as ordered  Take Vital Signs  Increase Vital Sign Frequency  Yellow: Q2hr x1, continue Q4hrs until patient remains green for 12hrs  Escalate  MEWS: Escalate Yellow: Discuss with charge nurse and consider notifying provider and/or RRT  Notify: Charge Nurse/RN  Name of Charge Nurse/RN Richarda Overlie, RN  Provider Notification  Provider Name/Title Adikari  Date Provider Notified 09/12/22  Time Provider Notified 1512  Method of Notification Page  Notification Reason Other (Comment) (Yellow MEWS d/t RR)  Provider response Evaluate remotely  Assess: SIRS CRITERIA  SIRS Temperature  0  SIRS Pulse 0  SIRS Respirations  1  SIRS WBC 0  SIRS Score Sum  1

## 2022-09-12 NOTE — Progress Notes (Signed)
Heart Failure Nurse Navigator Progress Note  PCP: Shirline Frees, NP PCP-Cardiologist: Dr Clifton James Admission Diagnosis: Acute on chronic respiratory failure with hypoxia and hypercapnia Admitted from: Home  Presentation:   [name] with PMH of CHF with recovered EF, COPD on chronic O2, HTN, CAD, SAH, and orthostatic hypotension.   She presented to the ED on 6/5 with shortness of breath and as code STEMI. Per MD, STEMI cancelled. She was seen in urgent care 2 days prior and was given a Z-pack. In the ED, was tachycardic, CXR with hyperinflation but no acute abnormality, BNP elevated, troponin elevated. Cardiology and pulmonary have been consulted. Prior ECHO in 09/2021 with EF 60-65% (improved from 35-40% in 08/2021 due to stress cardiomyopathy). ECHO 6/6 showed LVEF 35-40%, no regional wall motion abnormalities, G1DD, RV normal. Findings most likely consistent with recurrent Takotsubo pattern.   Detailed education and instructions provided on heart failure disease management including the following:  Signs and symptoms of Heart Failure When to call the physician Importance of daily weights (pt states she has been weighing herself in the morning and at night. If weight changed, would take lasix before bedtime. Encouraged to begin daily weights in the morning only.) Low sodium diet Fluid restriction Medication management Anticipated future follow-up appointments  Patient education given on each of the above topics.  Patient acknowledges understanding via teach back method and acceptance of all instructions.  Education Materials:  "Living Better With Heart Failure" Booklet, HF zone tool, & Daily Weight Tracker Tool.  Patient has scale at home: yes Patient has pill box at home: yes   ECHO/ LVEF: 35-40%  Clinical Course:  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   COPD 08/31/2007   DEPRESSION 11/19/2006   DYSLIPIDEMIA 03/12/2009   MI (mitral incompetence)     NEPHROLITHIASIS 01/05/2008   OSTEOARTHRITIS 06/05/2009   PARESTHESIA 08/24/2007   TOBACCO ABUSE 01/25/2010     Social History   Socioeconomic History   Marital status: Married    Spouse name: Thereasa Distance   Number of children: 2   Years of education: Not on file   Highest education level: Not on file  Occupational History   Occupation: Patient care aide-sits with elderly lady  Tobacco Use   Smoking status: Former    Packs/day: 2.00    Years: 38.00    Additional pack years: 0.00    Total pack years: 76.00    Types: Cigarettes    Quit date: 03/07/2014    Years since quitting: 8.5   Smokeless tobacco: Never  Vaping Use   Vaping Use: Never used  Substance and Sexual Activity   Alcohol use: No    Alcohol/week: 0.0 standard drinks of alcohol   Drug use: No   Sexual activity: Not on file  Other Topics Concern   Not on file  Social History Narrative   [facility] (01/12/17):   Originally from [facility]. Has lived in [place] for 17 years. Married. Has 2 dogs currently. No mold and only 1 indoor plant. Had her home professionally cleaned a month ago. No bird or hot tub exposure. 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    Right handed    Social Determinants of Health   Financial Resource Strain: High Risk (09/10/2021)   Overall Financial Resource Strain (CARDIA)    Difficulty of Paying Living Expenses: Very hard  Food Insecurity: No Food Insecurity (09/10/2021)   Hunger Vital Sign    Worried  About Running Out of Food in the Last Year: Never true    Ran Out of Food in the Last Year: Never true  Transportation Needs: No Transportation Needs (09/10/2021)   PRAPARE - Administrator, Civil Service (Medical): No    Lack of Transportation (Non-Medical): No  Physical Activity: Inactive (03/25/2021)   Exercise Vital Sign    Days of Exercise per Week: 0 days    Minutes of Exercise per Session: 0 min  Stress: No Stress Concern Present (09/10/2021)   [employer] of Occupational Health - Occupational Stress Questionnaire    Feeling of Stress : Not at all  Social Connections: Moderately Isolated (03/25/2021)   Social Connection and Isolation Panel [NHANES]    Frequency of Communication with Friends and Family: More than three times a week    Frequency of Social Gatherings with Friends and Family: More than three times a week    Attends Religious Services: Never    Database administrator or Organizations: No    Attends Banker Meetings: Never    Marital Status: Married   Items for Follow-up on DC/TOC: GDMT titration with history of orthostatic hypotension Patient assistance application for Hilbert Bible, PharmD, BCPS Heart Failure Engineer, building services Phone [phone]

## 2022-09-12 NOTE — Progress Notes (Signed)
Per pt request, this RN, removed Bipap from pt and placed her on 3LNC. Pt also requested a breathing tx, pain med, & antianxiety med. Gave pt her prn meds first. Called RT for breathing tx.

## 2022-09-12 NOTE — Plan of Care (Signed)
  Problem: Education: Goal: Knowledge of disease or condition will improve Outcome: Progressing Goal: Knowledge of the prescribed therapeutic regimen will improve Outcome: Progressing   Problem: Activity: Goal: Ability to tolerate increased activity will improve Outcome: Progressing Goal: Will verbalize the importance of balancing activity with adequate rest periods Outcome: Progressing   Problem: Respiratory: Goal: Ability to maintain a clear airway will improve Outcome: Progressing Goal: Levels of oxygenation will improve Outcome: Progressing Goal: Ability to maintain adequate ventilation will improve Outcome: Progressing   Problem: Education: Goal: Knowledge of General Education information will improve Description: Including pain rating scale, medication(s)/side effects and non-pharmacologic comfort measures Outcome: Progressing   Problem: Health Behavior/Discharge Planning: Goal: Ability to manage health-related needs will improve Outcome: Progressing   Problem: Clinical Measurements: Goal: Ability to maintain clinical measurements within normal limits will improve Outcome: Progressing Goal: Will remain free from infection Outcome: Progressing Goal: Diagnostic test results will improve Outcome: Progressing Goal: Respiratory complications will improve Outcome: Progressing Goal: Cardiovascular complication will be avoided Outcome: Progressing   Problem: Activity: Goal: Risk for activity intolerance will decrease Outcome: Progressing   Problem: Nutrition: Goal: Adequate nutrition will be maintained Outcome: Progressing   Problem: Coping: Goal: Level of anxiety will decrease Outcome: Progressing   Problem: Elimination: Goal: Will not experience complications related to bowel motility Outcome: Progressing

## 2022-09-12 NOTE — Consult Note (Addendum)
   NAME:  Kim [name], MRN:  [id number], DOB:  [birth year], LOS: 0 ADMISSION DATE:  09/10/2022, CONSULTATION DATE:  09/11/2022  REFERRING MD:  Trevor Mace, CHIEF COMPLAINT: Respiratory distress  History of Present Illness:  Kim [name] is a 67 year old ex-smoker with severe COPD, chronic respiratory failure on 2 L oxygen and NIV who is known to me for several years.  We have tried Dupixent injections in the past because her exacerbations have explosive onset suggestive of asthma but she had a reactions.  I have given her prednisone prescription for self administration.  Her last exacerbation was about a year ago. She reports that she is struggling with this flare for a few days.  Husband reports exposure to welding fumes when he was working on something in the house, and smoke when a neighbor was burning something.  She went to urgent care, chest x-ray did not show any infiltrates and she was given a Z-Pak.  She started self administering prednisone but her breathing got worse and she had to call EMS. She was placed on BiPAP, labs showed troponin of 67 then 700, leukocytosis  Pertinent  Medical History  Takatsubo cardiomyopathy 2015, 2017 and 2022,  osteoporosis ,vertebral fractures   Significant Hospital Events: Including procedures, antibiotic start and stop dates in addition to other pertinent events     Interim History / Subjective:  About same as yesterday   Objective   Blood pressure 94/61, pulse 77, temperature 98.1 F (36.7 C), temperature source Oral, resp. rate 19, height 5' 1.5" (1.562 m), weight 63.5 kg, SpO2 97 %.    FiO2 (%):  [28 %-32 %] 28 %   Intake/Output Summary (Last 24 hours) at 09/11/2022 1534 Last data filed at 09/11/2022 1432 Gross per 24 hour  Intake 601.87 ml  Output --  Net 601.87 ml   Filed Weights   09/11/22 0804  Weight: 63.5 kg    Examination: General this is a chronically ill-appearing 67 year old female she remains hospitalized with significant work of  breathing only able to talk in 1-2 word phrases HEENT normocephalic atraumatic no jugular venous distention Pulmonary diffuse wheezing with prolonged expiratory phase Cardiac: Regular rate and rhythm Abdomen: Soft nontender Extremities: Warm dry no edema Neuro: Anxious alert oriented   Resolved Hospital Problem list     Assessment & Plan:  Acute on chronic hypercarbic respiratory failure COPD exacerbation Still has fairly purulent sputum, work of breathing really not that much better reports difficulty expectorating.  Still with fairly significant bronchospasm Plan  Continue current Solu-Medrol at 40 mg every 12 Day #2 Levaquin use budesonide/Brovana and Yupelri while in the hospital -She was on Advair and Spiriva combination due to other meds being expensive, with DuoNebs for breakthrough. Adding Mucinex Family will bring in flutter valve and incentive spirometer   Elevated troponin History of stress-induced cardiomyopathy Plan Per cardiology  Best Practice (right click and "Reselect all SmartList Selections" daily)    Code Status:  DNR Last date of multidisciplinary goals of care discussion : Based on prior conversations, she would not want mechanical ventilation or other forms of life support  Labs   CBC: 09/11/2022

## 2022-09-12 NOTE — Evaluation (Signed)
Physical Therapy Evaluation Patient Details Name: Kim [name] MRN: [id number] DOB: [birth year] Today's Date: 09/12/2022  History of Present Illness  Pt is 67 year old presented to [facility] on  09/10/22 for acute on chronic respiratory failure with hypoxia due to copd exacerbation. Pt also with acute on chronic heart failrue and suspected recurrent Takostubo cardiomyopathy and NSTEMI. PMH - copd, chronic resp failure on 2-3L O2,  cad, htn, chf, Takostubo cardiomyopathy, SAH, orthostatic hypotension, anxiety, depression  Clinical Impression  Pt admitted with above diagnosis and presents to PT with functional limitations due to deficits listed below (See PT problem list). Pt needs skilled PT to maximize independence and safety. Pt primarily limited by respiratory status. Expect slow, steady progress with functional activity tolerance. Plans to return home with spouse and likely will not need PT at DC.          Recommendations for follow up therapy are one component of a multi-disciplinary discharge planning process, led by the attending physician.  Recommendations may be updated based on patient status, additional functional criteria and insurance authorization.  Follow Up Recommendations       Assistance Recommended at Discharge Intermittent Supervision/Assistance  Patient can return home with the following  Help with stairs or ramp for entrance;Assist for transportation;A little help with bathing/dressing/bathroom;Assistance with cooking/housework    Equipment Recommendations None recommended by PT  Recommendations for Other Services       Functional Status Assessment Patient has had a recent decline in their functional status and demonstrates the ability to make significant improvements in function in a reasonable and predictable amount of time.     Precautions / Restrictions Precautions Precautions: Fall      Mobility  Bed Mobility Overal bed mobility: Needs Assistance Bed  Mobility: Supine to Sit     Supine to sit: Min assist     General bed mobility comments: Assist to pull trunk up into sitting    Transfers Overall transfer level: Needs assistance Equipment used: None Transfers: Sit to/from Stand, Bed to chair/wheelchair/BSC Sit to Stand: Min guard   Step pivot transfers: Min guard       General transfer comment: Assist for safety and lines    Ambulation/Gait               General Gait Details: Unable due to SOB  Stairs            Wheelchair Mobility    Modified Rankin (Stroke Patients Only)       Balance Overall balance assessment: Mild deficits observed, not formally tested                                           Pertinent Vitals/Pain Pain Assessment Pain Assessment: No/denies pain    Home Living Family/patient expects to be discharged to:: Private residence Living Arrangements: Spouse/significant other Available Help at Discharge: Family;Available 24 hours/day Type of Home: Apartment Home Access: Stairs to enter Entrance Stairs-Rails: Left Entrance Stairs-Number of Steps: 14   Home Layout: One level Home Equipment: Agricultural consultant (2 wheels);Rollator (4 wheels);Transport chair      Prior Function Prior Level of Function : Independent/Modified Independent             Mobility Comments: Household distance ambulation. Limited by SOB.       Hand Dominance   Dominant Hand: Right    Extremity/Trunk Assessment   Upper  Extremity Assessment Upper Extremity Assessment: Overall WFL for tasks assessed    Lower Extremity Assessment Lower Extremity Assessment: Generalized weakness       Communication   Communication: No difficulties  Cognition Arousal/Alertness: Awake/alert Behavior During Therapy: Anxious Overall Cognitive Status: Within Functional Limits for tasks assessed                                          General Comments General comments (skin  integrity, edema, etc.): VSS on 3L    Exercises     Assessment/Plan    PT Assessment Patient needs continued PT services  PT Problem List Decreased strength;Decreased activity tolerance;Decreased mobility;Cardiopulmonary status limiting activity       PT Treatment Interventions DME instruction;Gait training;Functional mobility training;Stair training;Therapeutic activities;Therapeutic exercise;Patient/family education    PT Goals (Current goals can be found in the Care Plan section)  Acute Rehab PT Goals Patient Stated Goal: return home PT Goal Formulation: With patient Time For Goal Achievement: 09/26/22 Potential to Achieve Goals: Fair    Frequency Min 1X/week     Co-evaluation               AM-PAC PT "6 Clicks" Mobility  Outcome Measure Help needed turning from your back to your side while in a flat bed without using bedrails?: None Help needed moving from lying on your back to sitting on the side of a flat bed without using bedrails?: A Little Help needed moving to and from a bed to a chair (including a wheelchair)?: A Little Help needed standing up from a chair using your arms (e.g., wheelchair or bedside chair)?: A Little Help needed to walk in hospital room?: Total Help needed climbing 3-5 steps with a railing? : Total 6 Click Score: 15    End of Session Equipment Utilized During Treatment: Oxygen Activity Tolerance: Treatment limited secondary to medical complications (Comment) (SOB) Patient left: in chair;with call bell/phone within reach;with chair alarm set;with nursing/sitter in room Nurse Communication: Mobility status PT Visit Diagnosis: Other abnormalities of gait and mobility (R26.89);Muscle weakness (generalized) (M62.81)    Time: 1610-9604 PT Time Calculation (min) (ACUTE ONLY): 23 min   Charges:   PT Evaluation $PT Eval Moderate Complexity: 1 Mod PT Treatments $Gait Training: 8-22 mins        [facility] PT Acute Rehabilitation  Services Office [phone]   Angelina Ok [facility] 09/12/2022, 4:06 PM

## 2022-09-13 DIAGNOSIS — J9622 Acute and chronic respiratory failure with hypercapnia: Secondary | ICD-10-CM | POA: Diagnosis not present

## 2022-09-13 DIAGNOSIS — N179 Acute kidney failure, unspecified: Secondary | ICD-10-CM | POA: Diagnosis not present

## 2022-09-13 DIAGNOSIS — J9621 Acute and chronic respiratory failure with hypoxia: Secondary | ICD-10-CM | POA: Diagnosis not present

## 2022-09-13 DIAGNOSIS — I214 Non-ST elevation (NSTEMI) myocardial infarction: Secondary | ICD-10-CM | POA: Diagnosis not present

## 2022-09-13 DIAGNOSIS — J441 Chronic obstructive pulmonary disease with (acute) exacerbation: Secondary | ICD-10-CM | POA: Diagnosis not present

## 2022-09-13 DIAGNOSIS — E876 Hypokalemia: Secondary | ICD-10-CM | POA: Diagnosis not present

## 2022-09-13 LAB — CBC
HCT: 35.1 % — ABNORMAL LOW (ref 36.0–46.0)
Hemoglobin: 11.2 g/dL — ABNORMAL LOW (ref 12.0–15.0)
MCH: 31.2 pg (ref 26.0–34.0)
MCHC: 31.9 g/dL (ref 30.0–36.0)
MCV: 97.8 fL (ref 80.0–100.0)
Platelets: 247 10*3/uL (ref 150–400)
RBC: 3.59 MIL/uL — ABNORMAL LOW (ref 3.87–5.11)
RDW: 13.8 % (ref 11.5–15.5)
WBC: 16.9 10*3/uL — ABNORMAL HIGH (ref 4.0–10.5)
nRBC: 0 % (ref 0.0–0.2)

## 2022-09-13 MED ORDER — ALPRAZOLAM 0.5 MG PO TABS
0.5000 mg | ORAL_TABLET | ORAL | Status: DC | PRN
  Administered 2022-09-13: 1 mg via ORAL
  Administered 2022-09-13: 0.5 mg via ORAL
  Administered 2022-09-14 – 2022-09-15 (×3): 1 mg via ORAL
  Administered 2022-09-15 (×2): 0.5 mg via ORAL
  Administered 2022-09-16 – 2022-09-17 (×2): 1 mg via ORAL
  Administered 2022-09-17: 0.5 mg via ORAL
  Administered 2022-09-18: 1 mg via ORAL
  Filled 2022-09-13: qty 2
  Filled 2022-09-13: qty 1
  Filled 2022-09-13 (×2): qty 2
  Filled 2022-09-13 (×3): qty 1
  Filled 2022-09-13 (×3): qty 2
  Filled 2022-09-13 (×2): qty 1
  Filled 2022-09-13: qty 2

## 2022-09-13 MED ORDER — METHYLPREDNISOLONE SODIUM SUCC 40 MG IJ SOLR
40.0000 mg | INTRAMUSCULAR | Status: DC
  Administered 2022-09-14 – 2022-09-16 (×3): 40 mg via INTRAVENOUS
  Filled 2022-09-13 (×3): qty 1

## 2022-09-13 MED ORDER — BISOPROLOL FUMARATE 5 MG PO TABS
2.5000 mg | ORAL_TABLET | Freq: Every day | ORAL | Status: DC
  Administered 2022-09-13 – 2022-09-18 (×6): 2.5 mg via ORAL
  Filled 2022-09-13 (×6): qty 1

## 2022-09-13 NOTE — Progress Notes (Signed)
PROGRESS NOTE    Kim [name]  KGM:[id number] DOB: 1955/04/20 DOA: 09/10/2022 PCP: Shirline Frees, NP     Brief Narrative:  Patient is a 67 year old female with history of nonobstructive coronary disease, hypertension, hyperlipidemia, diastolic CHF, stress-induced cardiomyopathy, COPD, chronic hypoxic respiratory failure on oxygen at home at 2 L presented with shortness of breath.  She also uses BiPAP/ventilation at night.  Patient was taking prednisone which was prescribed as needed but it did not help.  She was seen at urgent care 2 d prior to admission, found to have clear X-ray and was prescribed Z-Pak.  Patient presented to the emergency department because of worsening shortness of breath.  EMS put on nonrebreather.  Given bronchodilator treatment and Solu-Medrol.  On presentation, blood pressure was stable.  She was put on BiPAP.  Lab work showed potassium 3.3, creatinine of 1.6, WBC count of 26.6, hemoglobin, troponin 67 which increased to 700s.  Cardiology /pulmonology consulted and following and currently being managed for severe COPD exacerbation   New events last 24 hours / Subjective: Transitioned to nonrebreather this morning.  Reports feeling better overall.  She gets very anxious when she gets up and moves around.  She is on Xanax at home.  This has been limited due to potential respiratory depression.  Both she and her husband would like an increased amount.  They also like to hold off on starting Farxiga until discussing it with her regular outpatient cardiologist.  Assessment & Plan:   Principal Problem:   Acute on chronic respiratory failure with hypoxia and hypercapnia (HCC)  Currently on nonrebreather, wean as able  Levaquin 750 mg daily  Solu-Medrol 80 mg daily  Neb treatments as needed   Active Problems:   Dyslipidemia  Crestor 20 mg daily  Zetia 10 mg daily    Anxiety and depression  Paxil 40 mg daily  Xanax as needed, try to limit use    Coronary artery  calcification seen on CAT scan  Crestor 20 mg daily  Aspirin    Chronic combined systolic (congestive) and diastolic (congestive) heart failure (HCC)  Appreciate cardiology  Marcelline Deist was prescribed by the cardiology team but she elects to decline this until she sees her cardiologist on an outpatient basis  Spironolactone 12.5 mg daily    Essential hypertension  Aldactone 12.5 mg daily  Bisoprolol 2.5 mg daily     AKI (acute kidney injury) (HCC)  Monitor BMP, though currently resolved     COPD exacerbation (HCC)  As above  DVT prophylaxis: Refusing Lovenox, will have her wear SCDs.  Code Status: DNR Family Communication: Husband bedside Coming From: Home Disposition Plan: Probably home Barriers to Discharge: Improvement  Consultants:  Cardiology  Antimicrobials:  Anti-infectives (From admission, onward)    Start     Dose/Rate Route Frequency Ordered Stop   09/11/22 1000  levofloxacin (LEVAQUIN) tablet 750 mg        750 mg Oral Every 48 hours 09/11/22 0802          Objective: Vitals:   09/13/22 0800 09/13/22 0819 09/13/22 1000 09/13/22 1059  BP: 115/68  (!) 115/57 (!) 111/56  Pulse: 81   79  Resp: 17 18 20 19   Temp: 97.9 F (36.6 C)   98 F (36.7 C)  TempSrc: Oral   Oral  SpO2: 100%   100%  Weight:      Height:        Intake/Output Summary (Last 24 hours) at 09/13/2022 1137 Last data filed at  09/13/2022 0526 Gross per 24 hour  Intake 420 ml  Output 1300 ml  Net -880 ml   Filed Weights   09/11/22 0804 09/12/22 0433 09/13/22 0521  Weight: 63.5 kg 65.8 kg 65.8 kg    Examination:  General exam: Appears calm and comfortable  Respiratory system: Faint expiratory wheezes heard throughout.  Respiratory effort normal. No respiratory distress. No conversational dyspnea.  Cardiovascular system: S1 & S2 heard, RRR.  Gastrointestinal system: Abdomen is nondistended, soft and nontender. Normal bowel sounds heard. Central nervous system: Alert and oriented. No focal  neurological deficits. Speech clear.  Extremities: Symmetric in appearance  Skin: No rashes, lesions or ulcers on exposed skin  Psychiatry: Judgement and insight appear normal. Mood & affect appropriate.   Data Reviewed: I have personally reviewed following labs and imaging studies  CBC: Recent Labs  Lab 09/10/22 1101 09/10/22 1106 09/11/22 0147 09/12/22 0120 09/13/22 0107  WBC 26.6*  --  15.8* 17.5* 16.9*  NEUTROABS 14.4*  --   --   --   --   HGB 13.0 13.3 11.4* 10.9* 11.2*  HCT 41.8 39.0 35.1* 34.3* 35.1*  MCV 103.5*  --  99.7 98.0 97.8  PLT 301  --  226 249 247   Basic Metabolic Panel: Recent Labs  Lab 09/10/22 1101 09/10/22 1106 09/10/22 1321 09/11/22 0147 09/12/22 0120  NA 137 137  --  139 140  K 3.3* 3.4*  --  3.4* 4.3  CL 92*  --   --  98 104  CO2 31  --   --  28 30  GLUCOSE 353*  --   --  145* 134*  BUN 33*  --   --  36* 32*  CREATININE 1.62*  --   --  1.21* 1.11*  CALCIUM 9.1  --   --  8.6* 8.9  MG  --   --  3.3*  --   --    GFR: Estimated Creatinine Clearance: 42.7 mL/min (A) (by C-G formula based on SCr of 1.11 mg/dL (H)). Liver Function Tests: Recent Labs  Lab 09/10/22 1101 09/11/22 0147  AST 41 41  ALT 27 28  ALKPHOS 66 55  BILITOT 0.6 0.4  PROT 6.1* 5.3*  ALBUMIN 3.6 3.0*   BNP (last 3 results) Recent Labs    10/04/21 1436  PROBNP 76.0    Recent Results (from the past 240 hour(s))  Respiratory (~20 pathogens) panel by PCR     Status: None   Collection Time: 09/10/22 12:41 PM   Specimen: Nasopharyngeal Swab; Respiratory  Result Value Ref Range Status   Adenovirus NOT DETECTED NOT DETECTED Final   Coronavirus 229E NOT DETECTED NOT DETECTED Final    Comment: (NOTE) The Coronavirus on the Respiratory Panel, DOES NOT test for the novel  Coronavirus (2019 nCoV)    Coronavirus HKU1 NOT DETECTED NOT DETECTED Final   Coronavirus NL63 NOT DETECTED NOT DETECTED Final   Coronavirus OC43 NOT DETECTED NOT DETECTED Final   Metapneumovirus NOT  DETECTED NOT DETECTED Final   Rhinovirus / Enterovirus NOT DETECTED NOT DETECTED Final   Influenza A NOT DETECTED NOT DETECTED Final   Influenza B NOT DETECTED NOT DETECTED Final   Parainfluenza Virus 1 NOT DETECTED NOT DETECTED Final   Parainfluenza Virus 2 NOT DETECTED NOT DETECTED Final   Parainfluenza Virus 3 NOT DETECTED NOT DETECTED Final   Parainfluenza Virus 4 NOT DETECTED NOT DETECTED Final   Respiratory Syncytial Virus NOT DETECTED NOT DETECTED Final   Bordetella pertussis NOT  DETECTED NOT DETECTED Final   Bordetella Parapertussis NOT DETECTED NOT DETECTED Final   Chlamydophila pneumoniae NOT DETECTED NOT DETECTED Final   Mycoplasma pneumoniae NOT DETECTED NOT DETECTED Final    Comment: Performed at [facility], [address], [place], [place] [address]  Resp Panel by RT-PCR (Flu A&B, Covid)     Status: None   Collection Time: 09/10/22 12:42 PM  Result Value Ref Range Status   SARS Coronavirus 2 by RT PCR NEGATIVE NEGATIVE Final   Influenza A by PCR NEGATIVE NEGATIVE Final   Influenza B by PCR NEGATIVE NEGATIVE Final    Comment: (NOTE) The Xpert Xpress SARS-CoV-2/FLU/RSV plus assay is intended as an aid in the diagnosis of influenza from Nasopharyngeal swab specimens and should not be used as a sole basis for treatment. Nasal washings and aspirates are unacceptable for Xpert Xpress SARS-CoV-2/FLU/RSV testing.  Fact Sheet for Patients: BloggerCourse.com  Fact Sheet for Healthcare Providers: SeriousBroker.it  This test is not yet approved or cleared by the [place] FDA and has been authorized for detection and/or diagnosis of SARS-CoV-2 by FDA under an Emergency Use Authorization (EUA). This EUA will remain in effect (meaning this test can be used) for the duration of the COVID-19 declaration under Section 564(b)(1) of the Act, 21 U.S.C. section 360bbb-3(b)(1), unless the authorization is terminated  or revoked.  Performed at [facility], [address] [place], [place] [address]       Radiology Studies: ECHOCARDIOGRAM COMPLETE  Result Date: 09/11/2022    ECHOCARDIOGRAM REPORT   Patient Name:   Kim [name] Date of Exam: 09/11/2022 Medical Rec #:  [id number]       Height:       61.5 in Accession #:    [id number]      Weight:       140.0 lb Date of Birth:  [birth year]      BSA:          1.633 m Patient Age:    66 years        BP:           94/61 mmHg Patient Gender: F               HR:           93 bpm. Exam Location:  Inpatient Procedure: 2D Echo, Color Doppler and Cardiac Doppler Indications:    Elevated Troponin  History:        Patient has prior history of Echocardiogram examinations, most                 recent 09/20/2021. CHF, Previous Myocardial Infarction, COPD,                 Signs/Symptoms:Shortness of Breath, Chest Pain and Syncope; Risk                 Factors:Hypertension and Dyslipidemia.  Sonographer:    Darlys Gales Referring Phys: [id number] Perlie Gold IMPRESSIONS  1. Poor endocardial visualization and no definity makes estimation of EF difficult Septum and apex as well as anterior wall hypokinetic. Left ventricular ejection fraction, by estimation, is 35 to 40%. The left ventricle has moderately decreased function. The left ventricle has no regional wall motion abnormalities. The left ventricular internal cavity size was moderately dilated. Left ventricular diastolic parameters are consistent with Grade I diastolic dysfunction (impaired relaxation).  2. Right ventricular systolic function is normal. The right ventricular size is normal.  3. The mitral  valve is normal in structure. No evidence of mitral valve regurgitation. No evidence of mitral stenosis.  4. The aortic valve was not well visualized. Aortic valve regurgitation is mild. Aortic valve sclerosis is present, with no evidence of aortic valve stenosis.  5. The inferior vena cava is normal in size with greater than 50%  respiratory variability, suggesting right atrial pressure of 3 mmHg. FINDINGS  Left Ventricle: Poor endocardial visualization and no definity makes estimation of EF difficult Septum and apex as well as anterior wall hypokinetic. Left ventricular ejection fraction, by estimation, is 35 to 40%. The left ventricle has moderately decreased function. The left ventricle has no regional wall motion abnormalities. The left ventricular internal cavity size was moderately dilated. There is no left ventricular hypertrophy. Left ventricular diastolic parameters are consistent with Grade I diastolic dysfunction (impaired relaxation). Right Ventricle: The right ventricular size is normal. No increase in right ventricular wall thickness. Right ventricular systolic function is normal. Left Atrium: Left atrial size was normal in size. Right Atrium: Right atrial size was normal in size. Pericardium: There is no evidence of pericardial effusion. Mitral Valve: The mitral valve is normal in structure. No evidence of mitral valve regurgitation. No evidence of mitral valve stenosis. Tricuspid Valve: The tricuspid valve is normal in structure. Tricuspid valve regurgitation is not demonstrated. No evidence of tricuspid stenosis. Aortic Valve: The aortic valve was not well visualized. Aortic valve regurgitation is mild. Aortic regurgitation PHT measures 337 msec. Aortic valve sclerosis is present, with no evidence of aortic valve stenosis. Aortic valve mean gradient measures 5.0 mmHg. Aortic valve peak gradient measures 8.4 mmHg. Aortic valve area, by VTI measures 2.57 cm. Pulmonic Valve: The pulmonic valve was normal in structure. Pulmonic valve regurgitation is not visualized. No evidence of pulmonic stenosis. Aorta: The aortic root is normal in size and structure. Venous: The inferior vena cava is normal in size with greater than 50% respiratory variability, suggesting right atrial pressure of 3 mmHg. IAS/Shunts: The interatrial septum  was not well visualized.  LEFT VENTRICLE PLAX 2D LVIDd:         5.40 cm   Diastology LVIDs:         3.90 cm   LV e' medial:    4.35 cm/s LV PW:         0.80 cm   LV E/e' medial:  18.9 LV IVS:        0.80 cm   LV e' lateral:   8.70 cm/s LVOT diam:     1.80 cm   LV E/e' lateral: 9.4 LV SV:         65 LV SV Index:   40 LVOT Area:     2.54 cm  RIGHT VENTRICLE RV S prime:     11.40 cm/s TAPSE (M-mode): 1.5 cm LEFT ATRIUM             Index        RIGHT ATRIUM          Index LA Vol (A2C):   15.3 ml 9.37 ml/m   RA Area:     7.20 cm LA Vol (A4C):   22.8 ml 13.96 ml/m  RA Volume:   11.70 ml 7.16 ml/m LA Biplane Vol: 20.1 ml 12.31 ml/m  AORTIC VALVE AV Area (Vmax):    2.56 cm AV Area (Vmean):   2.11 cm AV Area (VTI):     2.57 cm AV Vmax:           145.00 cm/s  AV Vmean:          106.000 cm/s AV VTI:            0.253 m AV Peak Grad:      8.4 mmHg AV Mean Grad:      5.0 mmHg LVOT Vmax:         146.00 cm/s LVOT Vmean:        88.100 cm/s LVOT VTI:          0.256 m LVOT/AV VTI ratio: 1.01 AI PHT:            337 msec  AORTA Ao Root diam: 2.40 cm MITRAL VALVE MV Area (PHT): 4.93 cm     SHUNTS MV Decel Time: 154 msec     Systemic VTI:  0.26 m MV E velocity: 82.10 cm/s   Systemic Diam: 1.80 cm MV A velocity: 107.00 cm/s MV E/A ratio:  0.77 Charlton Haws MD Electronically signed by Charlton Haws MD Signature Date/Time: 09/11/2022/3:30:55 PM    Final      Scheduled Meds:  albuterol  2.5 mg Nebulization Q4H   albuterol  15 mg/hr Nebulization Once   arformoterol  15 mcg Nebulization BID   aspirin EC  81 mg Oral Daily   bisoprolol  2.5 mg Oral Daily   budesonide (PULMICORT) nebulizer solution  0.5 mg Nebulization BID   dapagliflozin propanediol  10 mg Oral Daily   enoxaparin (LOVENOX) injection  40 mg Subcutaneous Q24H   ezetimibe  10 mg Oral Daily   guaiFENesin  600 mg Oral BID   levofloxacin  750 mg Oral Q48H   methylPREDNISolone (SOLU-MEDROL) injection  80 mg Intravenous Q24H   PARoxetine  40 mg Oral Daily    polyethylene glycol  17 g Oral Daily   revefenacin  175 mcg Nebulization Daily   rosuvastatin  20 mg Oral Daily   sodium chloride flush  3 mL Intravenous Q12H   spironolactone  12.5 mg Oral Daily   Continuous Infusions:   LOS: 2 days    Time spent: 40 minutes   Sharlene Dory, DO [facility] 09/13/2022, 11:37 AM   Available via Epic secure chat 7am-7pm After these hours, please refer to coverage provider listed on [email or web address]

## 2022-09-13 NOTE — Progress Notes (Signed)
Rounding Note    Patient Name: FRAIDY MCCARRICK Date of Encounter: 09/13/2022  [facility] Cardiologist: Verne Carrow, MD   Subjective   Some ongoing SOB  Inpatient Medications    Scheduled Meds:  albuterol  2.5 mg Nebulization Q4H   albuterol  15 mg/hr Nebulization Once   ALPRAZolam  0.5 mg Oral Q6H WA   arformoterol  15 mcg Nebulization BID   aspirin EC  81 mg Oral Daily   budesonide (PULMICORT) nebulizer solution  0.5 mg Nebulization BID   dapagliflozin propanediol  10 mg Oral Daily   enoxaparin (LOVENOX) injection  40 mg Subcutaneous Q24H   ezetimibe  10 mg Oral Daily   guaiFENesin  600 mg Oral BID   levofloxacin  750 mg Oral Q48H   methylPREDNISolone (SOLU-MEDROL) injection  80 mg Intravenous Q24H   PARoxetine  40 mg Oral Daily   polyethylene glycol  17 g Oral Daily   revefenacin  175 mcg Nebulization Daily   rosuvastatin  20 mg Oral Daily   sodium chloride flush  3 mL Intravenous Q12H   spironolactone  12.5 mg Oral Daily   Continuous Infusions:  PRN Meds: albuterol, albuterol, HYDROcodone-acetaminophen   Vital Signs    Vitals:   09/13/22 0521 09/13/22 0534 09/13/22 0800 09/13/22 0819  BP: 119/66  115/68   Pulse: 77  81   Resp: 20  17 18   Temp: (!) 97 F (36.1 C)  97.9 F (36.6 C)   TempSrc: Oral  Oral   SpO2: 99% 100% 100%   Weight: 65.8 kg     Height:        Intake/Output Summary (Last 24 hours) at 09/13/2022 0824 Last data filed at 09/13/2022 0526 Gross per 24 hour  Intake 660 ml  Output 1300 ml  Net -640 ml      09/13/2022    5:21 AM 09/12/2022    4:33 AM 09/11/2022    8:04 AM  Last 3 Weights  Weight (lbs) 145 lb 1 oz 145 lb 1 oz 140 lb  Weight (kg) 65.8 kg 65.8 kg 63.504 kg      Telemetry    SR - Personally Reviewed  ECG    N/a - Personally Reviewed  Physical Exam   GEN: No acute distress.   Neck: No JVD Cardiac: RRR, no murmurs, rubs, or gallops.  Respiratory: Clear to auscultation bilaterally. GI: Soft,  nontender, non-distended  MS: No edema; No deformity. Neuro:  Nonfocal  Psych: Normal affect   Labs    High Sensitivity Troponin:   Recent Labs  Lab 09/10/22 1101 09/10/22 1321 09/11/22 1216 09/11/22 1336  TROPONINIHS 67* 720* 1,169* 876*     Chemistry Recent Labs  Lab 09/10/22 1101 09/10/22 1106 09/10/22 1321 09/11/22 0147 09/12/22 0120  NA 137 137  --  139 140  K 3.3* 3.4*  --  3.4* 4.3  CL 92*  --   --  98 104  CO2 31  --   --  28 30  GLUCOSE 353*  --   --  145* 134*  BUN 33*  --   --  36* 32*  CREATININE 1.62*  --   --  1.21* 1.11*  CALCIUM 9.1  --   --  8.6* 8.9  MG  --   --  3.3*  --   --   PROT 6.1*  --   --  5.3*  --   ALBUMIN 3.6  --   --  3.0*  --  AST 41  --   --  41  --   ALT 27  --   --  28  --   ALKPHOS 66  --   --  55  --   BILITOT 0.6  --   --  0.4  --   GFRNONAA 34*  --   --  49* 54*  ANIONGAP 14  --   --  13 6    Lipids No results for input(s): "CHOL", "TRIG", "HDL", "LABVLDL", "LDLCALC", "CHOLHDL" in the last 168 hours.  Hematology Recent Labs  Lab 09/11/22 0147 09/12/22 0120 09/13/22 0107  WBC 15.8* 17.5* 16.9*  RBC 3.52* 3.50* 3.59*  HGB 11.4* 10.9* 11.2*  HCT 35.1* 34.3* 35.1*  MCV 99.7 98.0 97.8  MCH 32.4 31.1 31.2  MCHC 32.5 31.8 31.9  RDW 13.9 13.7 13.8  PLT 226 249 247   Thyroid No results for input(s): "TSH", "FREET4" in the last 168 hours.  BNP Recent Labs  Lab 09/10/22 1101  BNP 236.4*    DDimer No results for input(s): "DDIMER" in the last 168 hours.   Radiology    ECHOCARDIOGRAM COMPLETE  Result Date: 09/11/2022    ECHOCARDIOGRAM REPORT   Patient Name:   LATYA HENDRICKSEN Date of Exam: 09/11/2022 Medical Rec #:  [id number]       Height:       61.5 in Accession #:    [id number]      Weight:       140.0 lb Date of Birth:  04-22-1955      BSA:          1.633 m Patient Age:    67 years        BP:           94/61 mmHg Patient Gender: F               HR:           93 bpm. Exam Location:  Inpatient Procedure: 2D Echo, Color  Doppler and Cardiac Doppler Indications:    Elevated Troponin  History:        Patient has prior history of Echocardiogram examinations, most                 recent 09/20/2021. CHF, Previous Myocardial Infarction, COPD,                 Signs/Symptoms:Shortness of Breath, Chest Pain and Syncope; Risk                 Factors:Hypertension and Dyslipidemia.  Sonographer:    Darlys Gales Referring Phys: [id number] Perlie Gold IMPRESSIONS  1. Poor endocardial visualization and no definity makes estimation of EF difficult Septum and apex as well as anterior wall hypokinetic. Left ventricular ejection fraction, by estimation, is 35 to 40%. The left ventricle has moderately decreased function. The left ventricle has no regional wall motion abnormalities. The left ventricular internal cavity size was moderately dilated. Left ventricular diastolic parameters are consistent with Grade I diastolic dysfunction (impaired relaxation).  2. Right ventricular systolic function is normal. The right ventricular size is normal.  3. The mitral valve is normal in structure. No evidence of mitral valve regurgitation. No evidence of mitral stenosis.  4. The aortic valve was not well visualized. Aortic valve regurgitation is mild. Aortic valve sclerosis is present, with no evidence of aortic valve stenosis.  5. The inferior vena cava is normal in size with greater than 50% respiratory variability, suggesting right  atrial pressure of 3 mmHg. FINDINGS  Left Ventricle: Poor endocardial visualization and no definity makes estimation of EF difficult Septum and apex as well as anterior wall hypokinetic. Left ventricular ejection fraction, by estimation, is 35 to 40%. The left ventricle has moderately decreased function. The left ventricle has no regional wall motion abnormalities. The left ventricular internal cavity size was moderately dilated. There is no left ventricular hypertrophy. Left ventricular diastolic parameters are consistent with Grade  I diastolic dysfunction (impaired relaxation). Right Ventricle: The right ventricular size is normal. No increase in right ventricular wall thickness. Right ventricular systolic function is normal. Left Atrium: Left atrial size was normal in size. Right Atrium: Right atrial size was normal in size. Pericardium: There is no evidence of pericardial effusion. Mitral Valve: The mitral valve is normal in structure. No evidence of mitral valve regurgitation. No evidence of mitral valve stenosis. Tricuspid Valve: The tricuspid valve is normal in structure. Tricuspid valve regurgitation is not demonstrated. No evidence of tricuspid stenosis. Aortic Valve: The aortic valve was not well visualized. Aortic valve regurgitation is mild. Aortic regurgitation PHT measures 337 msec. Aortic valve sclerosis is present, with no evidence of aortic valve stenosis. Aortic valve mean gradient measures 5.0 mmHg. Aortic valve peak gradient measures 8.4 mmHg. Aortic valve area, by VTI measures 2.57 cm. Pulmonic Valve: The pulmonic valve was normal in structure. Pulmonic valve regurgitation is not visualized. No evidence of pulmonic stenosis. Aorta: The aortic root is normal in size and structure. Venous: The inferior vena cava is normal in size with greater than 50% respiratory variability, suggesting right atrial pressure of 3 mmHg. IAS/Shunts: The interatrial septum was not well visualized.  LEFT VENTRICLE PLAX 2D LVIDd:         5.40 cm   Diastology LVIDs:         3.90 cm   LV e' medial:    4.35 cm/s LV PW:         0.80 cm   LV E/e' medial:  18.9 LV IVS:        0.80 cm   LV e' lateral:   8.70 cm/s LVOT diam:     1.80 cm   LV E/e' lateral: 9.4 LV SV:         65 LV SV Index:   40 LVOT Area:     2.54 cm  RIGHT VENTRICLE RV S prime:     11.40 cm/s TAPSE (M-mode): 1.5 cm LEFT ATRIUM             Index        RIGHT ATRIUM          Index LA Vol (A2C):   15.3 ml 9.37 ml/m   RA Area:     7.20 cm LA Vol (A4C):   22.8 ml 13.96 ml/m  RA Volume:    11.70 ml 7.16 ml/m LA Biplane Vol: 20.1 ml 12.31 ml/m  AORTIC VALVE AV Area (Vmax):    2.56 cm AV Area (Vmean):   2.11 cm AV Area (VTI):     2.57 cm AV Vmax:           145.00 cm/s AV Vmean:          106.000 cm/s AV VTI:            0.253 m AV Peak Grad:      8.4 mmHg AV Mean Grad:      5.0 mmHg LVOT Vmax:         146.00 cm/s  LVOT Vmean:        88.100 cm/s LVOT VTI:          0.256 m LVOT/AV VTI ratio: 1.01 AI PHT:            337 msec  AORTA Ao Root diam: 2.40 cm MITRAL VALVE MV Area (PHT): 4.93 cm     SHUNTS MV Decel Time: 154 msec     Systemic VTI:  0.26 m MV E velocity: 82.10 cm/s   Systemic Diam: 1.80 cm MV A velocity: 107.00 cm/s MV E/A ratio:  0.77 Charlton Haws MD Electronically signed by Charlton Haws MD Signature Date/Time: 09/11/2022/3:30:55 PM    Final     Cardiac Studies    Patient Profile     67 y.o. female with history of non-obstructive CAD, takostubo cardiomyopathy, severe COPD with chronic respiratory failure, HLD, and tobacco abuse who presents to clinic with acute COPD exacerbation with course complicated by elevated trop for which Cardiology was consulted.   Assessment & Plan    1.Elevated troponin - history of Takotsubo CM in both 2017 and 2022 in setting of COPD exacerbations. Cath during both presentations without significant CAD - LVEF recovered after both prior episodes, prior to admit echo 09/2021 LVEF 60-65%  -this admit presents with COPD exacerbation, elevated troponins, and recurrent apical WMA and LVEF down to 35% - Given recent negative cath in 2022 and history of takostubox2 in the setting of COPD exacerbation as detailed above, this is likely recurrent takostubo CM  - agree no plans for repeat ischemic testing, manage medically - medical therapy with farxiga 10mg , aldactone 12.5mg . Soft bp's at times limit med options. Listed allergy to losartan with possible cause of severe hypotension but. Lisinopril caused a cough. Would avoid ACE/ARB/ARNI at this time. Likely  restart her home bisoprolol given drop in LVEF but also prior history of VT - was hesitant to continue farxiga but after discussing she is willing to continue   2.NSTEMI - -Trop peak 1169 -Suspect this is secondary to recurrent takostubo as detailed above with no anginal symptoms and minimal disease on cath in 2022 -If EF remains depressed despite improvement from a COPD exacerbation, can plan for repeat ischemic evaluation at that time  - medical therapy with ASA 81, crestor 20  3. COPD exacerbation - per primary team  4. History of VT - followed by EP, had been on bisoprolol at home per EP - restart bisoprolol  For questions or updates, please contact [facility] Please consult [email or web address] for contact info under        Signed, Dina Rich, MD  09/13/2022, 8:24 AM

## 2022-09-13 NOTE — Plan of Care (Signed)
?  Problem: Education: ?Goal: Knowledge of disease or condition will improve ?Outcome: Progressing ?Goal: Knowledge of the prescribed therapeutic regimen will improve ?Outcome: Progressing ?Goal: Individualized Educational Video(s) ?Outcome: Progressing ?  ?Problem: Activity: ?Goal: Ability to tolerate increased activity will improve ?Outcome: Progressing ?Goal: Will verbalize the importance of balancing activity with adequate rest periods ?Outcome: Progressing ?  ?Problem: Respiratory: ?Goal: Ability to maintain a clear airway will improve ?Outcome: Progressing ?Goal: Levels of oxygenation will improve ?Outcome: Progressing ?Goal: Ability to maintain adequate ventilation will improve ?Outcome: Progressing ?  ?Problem: Education: ?Goal: Knowledge of General Education information will improve ?Description: Including pain rating scale, medication(s)/side effects and non-pharmacologic comfort measures ?Outcome: Progressing ?  ?Problem: Health Behavior/Discharge Planning: ?Goal: Ability to manage health-related needs will improve ?Outcome: Progressing ?  ?Problem: Clinical Measurements: ?Goal: Ability to maintain clinical measurements within normal limits will improve ?Outcome: Progressing ?Goal: Will remain free from infection ?Outcome: Progressing ?Goal: Diagnostic test results will improve ?Outcome: Progressing ?Goal: Respiratory complications will improve ?Outcome: Progressing ?Goal: Cardiovascular complication will be avoided ?Outcome: Progressing ?  ?Problem: Activity: ?Goal: Risk for activity intolerance will decrease ?Outcome: Progressing ?  ?Problem: Nutrition: ?Goal: Adequate nutrition will be maintained ?Outcome: Progressing ?  ?

## 2022-09-13 NOTE — Progress Notes (Signed)
NAME:  Kim [name], MRN:  [id number], DOB:  April 22, 1955, LOS: 2 ADMISSION DATE:  09/10/2022, CONSULTATION DATE:  09/13/2022  REFERRING MD:  Trevor Mace, CHIEF COMPLAINT: Respiratory distress  History of Present Illness:  Kim [name] is a 67 year old ex-smoker with severe COPD, chronic respiratory failure on 2 L oxygen and NIV who is known to me for several years.  We have tried Dupixent injections in the past because her exacerbations have explosive onset suggestive of asthma but she had a reactions.  I have given her prednisone prescription for self administration.  Her last exacerbation was about a year ago. She reports that she is struggling with this flare for a few days.  Husband reports exposure to welding fumes when he was working on something in the house, and smoke when a neighbor was burning something.  She went to urgent care, chest x-ray did not show any infiltrates and she was given a Z-Pak.  She started self administering prednisone but her breathing got worse and she had to call EMS. She was placed on BiPAP, labs showed troponin of 67 then 700, leukocytosis  Pertinent  Medical History  Takatsubo cardiomyopathy 2015, 2017 and 2022,  osteoporosis ,vertebral fractures   Significant Hospital Events: Including procedures, antibiotic start and stop dates in addition to other pertinent events   6/6 echo-EF 35 to 40%, septum apex and anterior wall hypokinetic  Interim History / Subjective:   Breathing was better today. But she requested to be put back on BiPAP in the afternoon  Objective   Blood pressure (!) 111/56, pulse 79, temperature 98 F (36.7 C), temperature source Oral, resp. rate 19, height 5' 1.5" (1.562 m), weight 65.8 kg, SpO2 100 %.    FiO2 (%):  [24 %] 24 %   Intake/Output Summary (Last 24 hours) at 09/13/2022 1532 Last data filed at 09/13/2022 0830 Gross per 24 hour  Intake 420 ml  Output 1300 ml  Net -880 ml    Filed Weights   09/11/22 0804 09/12/22 0433 09/13/22  0521  Weight: 63.5 kg 65.8 kg 65.8 kg    Examination: General chronically ill-appearing woman, no distress on BiPAP HEENT normocephalic atraumatic no jugular venous distention Pulmonary clear breath sounds bilateral, no rhonchi, no accessory muscle use Cardiac: Regular rate and rhythm Abdomen: Soft nontender Extremities: Warm dry no edema Neuro: Anxious alert oriented, able to speak in full sentences to the BiPAP  Labs show normal electrolytes, troponin trended down, peak 1169, mild leukocytosis Resolved Hospital Problem list     Assessment & Plan:  Acute on chronic hypercarbic respiratory failure COPD exacerbation -Bronchospasm is improving Plan  Drop Solu-Medrol to 40 every 12 Continue Levaquin for 5 days use budesonide/Brovana and Yupelri while in the hospital -She was on Advair and Spiriva combination due to other meds being expensive, with DuoNebs for breakthrough. Adding Mucinex Flutter valve   Elevated troponin History of stress-induced cardiomyopathy, now recurrent with EF down to 35 to 40%, managing conservatively Plan Per cardiology Medical therapy with Marcelline Deist, Aldactone Okay to add low-dose bisoprolol since bronchospasm has resolved  Best Practice (right click and "Reselect all SmartList Selections" daily)    Code Status:  DNR Last date of multidisciplinary goals of care discussion : DNR status, she would not want mechanical ventilation or other forms of life support, but full medical care including BiPAP    Kim Mourning MD. FCCP. [facility] Pager : [phone]  If no response to pager , please call [phone] until  7 pm After 7:00 pm call Elink  [phone]     09/13/2022

## 2022-09-14 DIAGNOSIS — J441 Chronic obstructive pulmonary disease with (acute) exacerbation: Secondary | ICD-10-CM | POA: Diagnosis not present

## 2022-09-14 DIAGNOSIS — R7989 Other specified abnormal findings of blood chemistry: Secondary | ICD-10-CM

## 2022-09-14 DIAGNOSIS — E876 Hypokalemia: Secondary | ICD-10-CM | POA: Diagnosis not present

## 2022-09-14 DIAGNOSIS — J9622 Acute and chronic respiratory failure with hypercapnia: Secondary | ICD-10-CM | POA: Diagnosis not present

## 2022-09-14 DIAGNOSIS — J9621 Acute and chronic respiratory failure with hypoxia: Secondary | ICD-10-CM | POA: Diagnosis not present

## 2022-09-14 DIAGNOSIS — N179 Acute kidney failure, unspecified: Secondary | ICD-10-CM | POA: Diagnosis not present

## 2022-09-14 LAB — BASIC METABOLIC PANEL
Anion gap: 6 (ref 5–15)
Anion gap: 8 (ref 5–15)
BUN: 32 mg/dL — ABNORMAL HIGH (ref 8–23)
BUN: 28 mg/dL — ABNORMAL HIGH (ref 8–23)
CO2: 29 mmol/L (ref 22–32)
CO2: 26 mmol/L (ref 22–32)
Calcium: 9.8 mg/dL (ref 8.9–10.3)
Calcium: 9.6 mg/dL (ref 8.9–10.3)
Chloride: 105 mmol/L (ref 98–111)
Chloride: 107 mmol/L (ref 98–111)
Creatinine, Ser: 1.23 mg/dL — ABNORMAL HIGH (ref 0.44–1.00)
Creatinine, Ser: 1.08 mg/dL — ABNORMAL HIGH (ref 0.44–1.00)
GFR, Estimated: 48 mL/min — ABNORMAL LOW (ref >60)
GFR, Estimated: 56 mL/min — ABNORMAL LOW (ref >60)
Glucose, Bld: 117 mg/dL — ABNORMAL HIGH (ref 70–99)
Glucose, Bld: 124 mg/dL — ABNORMAL HIGH (ref 70–99)
Potassium: 5.5 mmol/L — ABNORMAL HIGH (ref 3.5–5.1)
Potassium: 4.6 mmol/L (ref 3.5–5.1)
Sodium: 140 mmol/L (ref 135–145)
Sodium: 141 mmol/L (ref 135–145)

## 2022-09-14 LAB — CBC
HCT: 35.9 % — ABNORMAL LOW (ref 36.0–46.0)
Hemoglobin: 11.2 g/dL — ABNORMAL LOW (ref 12.0–15.0)
MCH: 31.4 pg (ref 26.0–34.0)
MCHC: 31.2 g/dL (ref 30.0–36.0)
MCV: 100.6 fL — ABNORMAL HIGH (ref 80.0–100.0)
Platelets: 261 10*3/uL (ref 150–400)
RBC: 3.57 MIL/uL — ABNORMAL LOW (ref 3.87–5.11)
RDW: 13.8 % (ref 11.5–15.5)
WBC: 15.4 10*3/uL — ABNORMAL HIGH (ref 4.0–10.5)
nRBC: 0 % (ref 0.0–0.2)

## 2022-09-14 MED ORDER — ALBUTEROL SULFATE (2.5 MG/3ML) 0.083% IN NEBU
2.5000 mg | INHALATION_SOLUTION | Freq: Four times a day (QID) | RESPIRATORY_TRACT | Status: DC
  Filled 2022-09-14: qty 3

## 2022-09-14 MED ORDER — ALBUTEROL SULFATE (2.5 MG/3ML) 0.083% IN NEBU: 2.5000 mg | INHALATION_SOLUTION | Freq: Four times a day (QID) | RESPIRATORY_TRACT | Status: DC

## 2022-09-14 MED ORDER — ALBUTEROL SULFATE (2.5 MG/3ML) 0.083% IN NEBU
2.5000 mg | INHALATION_SOLUTION | Freq: Four times a day (QID) | RESPIRATORY_TRACT | Status: DC
  Administered 2022-09-14 – 2022-09-17 (×10): 2.5 mg via RESPIRATORY_TRACT
  Filled 2022-09-14 (×11): qty 3

## 2022-09-14 MED ORDER — NYSTATIN 100000 UNIT/ML MT SUSP
5.0000 mL | Freq: Four times a day (QID) | OROMUCOSAL | Status: DC
  Administered 2022-09-14 – 2022-09-18 (×10): 500000 [IU] via ORAL
  Filled 2022-09-14 (×12): qty 5

## 2022-09-14 MED ORDER — SCOPOLAMINE 1 MG/3DAYS TD PT72: 1.0000 | MEDICATED_PATCH | Freq: Once | TRANSDERMAL | Status: DC | PRN

## 2022-09-14 MED ORDER — DEXTROSE 50 % IV SOLN
50.0000 mL | Freq: Once | INTRAVENOUS | Status: AC
  Administered 2022-09-14: 50 mL via INTRAVENOUS
  Filled 2022-09-14: qty 50

## 2022-09-14 MED ORDER — INSULIN ASPART 100 UNIT/ML IV SOLN
10.0000 [IU] | Freq: Once | INTRAVENOUS | Status: AC
  Administered 2022-09-14: 10 [IU] via INTRAVENOUS

## 2022-09-14 NOTE — Progress Notes (Signed)
PROGRESS NOTE    Kim [name]  GNF:[id number] DOB: [birth year] DOA: 09/10/2022 PCP: Shirline Frees, NP     Brief Narrative:  Patient is a 66 year old female with history of nonobstructive coronary disease, hypertension, hyperlipidemia, diastolic CHF, stress-induced cardiomyopathy, COPD, chronic hypoxic respiratory failure on oxygen at home at 2 L presented with shortness of breath.  She also uses BiPAP/ventilation at night.  Patient was taking prednisone which was prescribed as needed but it did not help.  She was seen at urgent care 2 d prior to admission, found to have clear X-ray and was prescribed Z-Pak.  Patient presented to the emergency department because of worsening shortness of breath.  EMS put on nonrebreather.  Given bronchodilator treatment and Solu-Medrol.  On presentation, blood pressure was stable.  She was put on BiPAP.  Lab work showed potassium 3.3, creatinine of 1.6, WBC count of 26.6, hemoglobin, troponin 67 which increased to 700s.  Cardiology /pulmonology consulted and following and currently being managed for severe COPD exacerbation and recurrence of Takotsubo's cardiomyopathy.   New events last 24 hours / Subjective: Patient is currently on nasal cannula oxygen supplementation and continues to feel better.  Xanax is helping and not regressing her breathing.  She still feels very weak and short of breath whenever she has much movement.  Physical therapy saw her once and had her out of the bed and sit in a chair, but reportedly never returned.  When she is sitting, she has no current chest pain, shortness of breath, fevers, or palpitations.  Assessment & Plan: Principal Problem:   Acute on chronic respiratory failure with hypoxia and hypercapnia (HCC)             Currently on [place] O2, wean as able             Levaquin 750 mg daily             Solu-Medrol 80 mg daily             Neb treatments as needed  Appreciate pulmonology recommendations              Active  Problems:   Dyslipidemia             Crestor 20 mg daily             Zetia 10 mg daily     Anxiety and depression             Paxil 40 mg daily             Xanax as needed, try to limit use     Coronary artery calcification seen on CAT scan             Crestor 20 mg daily             Aspirin     Chronic combined systolic (congestive) and diastolic (congestive) heart failure (HCC) 2/2 Takotsubo's CM             Appreciate cardiology             Marcelline Deist was prescribed by the cardiology team but she elects to decline this until she sees her cardiologist on an outpatient basis; will defer to cardiology team regarding this             Spironolactone 12.5 mg daily     Essential hypertension             Aldactone 12.5 mg daily  Bisoprolol 2.5 mg daily                AKI (acute kidney injury) (HCC)             Monitor BMP, though currently resolved                COPD exacerbation (HCC)             As above   DVT prophylaxis: Refusing Lovenox, will have her wear SCDs. Counseling provided by nursing for reason for this tx.   Code Status: DNR Family Communication: None bedside Coming From: Home Disposition Plan: Probably home vs SNF- Will have PT come by to see her to help with dispo Barriers to Discharge: Improvement   Consultants:  Cardiology  Pulmonology  Antimicrobials:  Anti-infectives (From admission, onward)    Start     Dose/Rate Route Frequency Ordered Stop   09/11/22 1000  levofloxacin (LEVAQUIN) tablet 750 mg        750 mg Oral Every 48 hours 09/11/22 0802          Objective: Vitals:   09/14/22 0831 09/14/22 0835 09/14/22 1034 09/14/22 1125  BP:   105/65   Pulse:   77   Resp:   20   Temp:      TempSrc:      SpO2: 100% 100% 95% 100%  Weight:      Height:        Intake/Output Summary (Last 24 hours) at 09/14/2022 1247 Last data filed at 09/14/2022 1054 Gross per 24 hour  Intake 817 ml  Output 1950 ml  Net -1133 ml   Filed Weights   09/12/22  0433 09/13/22 0521 09/14/22 0416  Weight: 65.8 kg 65.8 kg 62.8 kg    Examination:  General exam: Appears calm and comfortable  Respiratory system: Slightly reduced air exchange, faint wheezes heard at the bases. Respiratory effort normal. No respiratory distress. No conversational dyspnea.  Cardiovascular system: S1 & S2 heard, RRR.  Gastrointestinal system: Abdomen is nondistended, soft and nontender. Normal bowel sounds heard. Central nervous system: Alert and oriented. No focal neurological deficits. Speech clear.  Extremities: Symmetric in appearance  Skin: No rashes, lesions or ulcers on exposed skin  Psychiatry: Judgement and insight appear normal. Mood & affect appropriate.   Data Reviewed: I have personally reviewed following labs and imaging studies  CBC: Recent Labs  Lab 09/10/22 1101 09/10/22 1106 09/11/22 0147 09/12/22 0120 09/13/22 0107 09/14/22 0119  WBC 26.6*  --  15.8* 17.5* 16.9* 15.4*  NEUTROABS 14.4*  --   --   --   --   --   HGB 13.0 13.3 11.4* 10.9* 11.2* 11.2*  HCT 41.8 39.0 35.1* 34.3* 35.1* 35.9*  MCV 103.5*  --  99.7 98.0 97.8 100.6*  PLT 301  --  226 249 247 261   Basic Metabolic Panel: Recent Labs  Lab 09/10/22 1101 09/10/22 1106 09/10/22 1321 09/11/22 0147 09/12/22 0120 09/14/22 0119 09/14/22 0941  NA 137 137  --  139 140 140 141  K 3.3* 3.4*  --  3.4* 4.3 5.5* 4.6  CL 92*  --   --  98 104 105 107  CO2 31  --   --  28 30 29 26   GLUCOSE 353*  --   --  145* 134* 117* 124*  BUN 33*  --   --  36* 32* 32* 28*  CREATININE 1.62*  --   --  1.21* 1.11* 1.23* 1.08*  CALCIUM 9.1  --   --  8.6* 8.9 9.8 9.6  MG  --   --  3.3*  --   --   --   --    GFR: Estimated Creatinine Clearance: 42.9 mL/min (A) (by C-G formula based on SCr of 1.08 mg/dL (H)).  Liver Function Tests: Recent Labs  Lab 09/10/22 1101 09/11/22 0147  AST 41 41  ALT 27 28  ALKPHOS 66 55  BILITOT 0.6 0.4  PROT 6.1* 5.3*  ALBUMIN 3.6 3.0*   BNP (last 3 results) Recent Labs     10/04/21 1436  PROBNP 76.0   Recent Results (from the past 240 hour(s))  Respiratory (~20 pathogens) panel by PCR     Status: None   Collection Time: 09/10/22 12:41 PM   Specimen: Nasopharyngeal Swab; Respiratory  Result Value Ref Range Status   Adenovirus NOT DETECTED NOT DETECTED Final   Coronavirus 229E NOT DETECTED NOT DETECTED Final    Comment: (NOTE) The Coronavirus on the Respiratory Panel, DOES NOT test for the novel  Coronavirus (2019 nCoV)    Coronavirus HKU1 NOT DETECTED NOT DETECTED Final   Coronavirus NL63 NOT DETECTED NOT DETECTED Final   Coronavirus OC43 NOT DETECTED NOT DETECTED Final   Metapneumovirus NOT DETECTED NOT DETECTED Final   Rhinovirus / Enterovirus NOT DETECTED NOT DETECTED Final   Influenza A NOT DETECTED NOT DETECTED Final   Influenza B NOT DETECTED NOT DETECTED Final   Parainfluenza Virus 1 NOT DETECTED NOT DETECTED Final   Parainfluenza Virus 2 NOT DETECTED NOT DETECTED Final   Parainfluenza Virus 3 NOT DETECTED NOT DETECTED Final   Parainfluenza Virus 4 NOT DETECTED NOT DETECTED Final   Respiratory Syncytial Virus NOT DETECTED NOT DETECTED Final   Bordetella pertussis NOT DETECTED NOT DETECTED Final   Bordetella Parapertussis NOT DETECTED NOT DETECTED Final   Chlamydophila pneumoniae NOT DETECTED NOT DETECTED Final   Mycoplasma pneumoniae NOT DETECTED NOT DETECTED Final    Comment: Performed at [facility], [address] [place], [place] [address]  Resp Panel by RT-PCR (Flu A&B, Covid)     Status: None   Collection Time: 09/10/22 12:42 PM  Result Value Ref Range Status   SARS Coronavirus 2 by RT PCR NEGATIVE NEGATIVE Final   Influenza A by PCR NEGATIVE NEGATIVE Final   Influenza B by PCR NEGATIVE NEGATIVE Final    Comment: (NOTE) The Xpert Xpress SARS-CoV-2/FLU/RSV plus assay is intended as an aid in the diagnosis of influenza from Nasopharyngeal swab specimens and should not be used as a sole basis for treatment. Nasal washings  and aspirates are unacceptable for Xpert Xpress SARS-CoV-2/FLU/RSV testing.  Fact Sheet for Patients: BloggerCourse.com  Fact Sheet for Healthcare Providers: SeriousBroker.it  This test is not yet approved or cleared by the [place] FDA and has been authorized for detection and/or diagnosis of SARS-CoV-2 by FDA under an Emergency Use Authorization (EUA). This EUA will remain in effect (meaning this test can be used) for the duration of the COVID-19 declaration under Section 564(b)(1) of the Act, 21 U.S.C. section 360bbb-3(b)(1), unless the authorization is terminated or revoked.  Performed at [facility], [address], [place], [place] [address]     Scheduled Meds:  albuterol  2.5 mg Nebulization Q4H   albuterol  15 mg/hr Nebulization Once   arformoterol  15 mcg Nebulization BID   aspirin EC  81 mg Oral Daily   bisoprolol  2.5 mg Oral Daily   budesonide (PULMICORT)  nebulizer solution  0.5 mg Nebulization BID   dapagliflozin propanediol  10 mg Oral Daily   enoxaparin (LOVENOX) injection  40 mg Subcutaneous Q24H   ezetimibe  10 mg Oral Daily   guaiFENesin  600 mg Oral BID   levofloxacin  750 mg Oral Q48H   methylPREDNISolone (SOLU-MEDROL) injection  40 mg Intravenous Q24H   PARoxetine  40 mg Oral Daily   polyethylene glycol  17 g Oral Daily   revefenacin  175 mcg Nebulization Daily   rosuvastatin  20 mg Oral Daily   sodium chloride flush  3 mL Intravenous Q12H   spironolactone  12.5 mg Oral Daily   Continuous Infusions:   LOS: 3 days    Time spent: 25 minutes   Sharlene Dory, DO [facility] 09/14/2022, 12:47 PM   Available via Epic secure chat 7am-7pm After these hours, please refer to coverage provider listed on [email or web address]

## 2022-09-14 NOTE — Progress Notes (Signed)
Mobility Specialist Progress Note:   09/14/22 1100  Mobility  Activity Transferred from bed to chair  Level of Assistance Contact guard assist, steadying assist  Assistive Device None  Distance Ambulated (ft) 3 ft  Activity Response Tolerated fair  Mobility Referral Yes  $Mobility charge 1 Mobility  Mobility Specialist Start Time (ACUTE ONLY) 1100  Mobility Specialist Stop Time (ACUTE ONLY) 1116  Mobility Specialist Time Calculation (min) (ACUTE ONLY) 16 min   Pt continues to be limited by anxiety. Declined ambulation at this time d/t xanax not taken prior. Pt agreeable to transfer to chair with minG and no AD. SpO2 95% on 2LO2. Pt left with all needs met.   Addison Lank Mobility Specialist Please contact via SecureChat or  Rehab office at [phone]
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
## 2022-09-14 NOTE — Plan of Care (Signed)
  Problem: Education: Goal: Knowledge of disease or condition will improve Outcome: Progressing Goal: Knowledge of the prescribed therapeutic regimen will improve Outcome: Progressing   Problem: Activity: Goal: Ability to tolerate increased activity will improve Outcome: Progressing Goal: Will verbalize the importance of balancing activity with adequate rest periods Outcome: Progressing   Problem: Respiratory: Goal: Ability to maintain a clear airway will improve Outcome: Progressing Goal: Levels of oxygenation will improve Outcome: Progressing Goal: Ability to maintain adequate ventilation will improve Outcome: Progressing   Problem: Education: Goal: Knowledge of General Education information will improve Description: Including pain rating scale, medication(s)/side effects and non-pharmacologic comfort measures Outcome: Progressing   Problem: Health Behavior/Discharge Planning: Goal: Ability to manage health-related needs will improve Outcome: Progressing   Problem: Clinical Measurements: Goal: Ability to maintain clinical measurements within normal limits will improve Outcome: Progressing Goal: Will remain free from infection Outcome: Progressing Goal: Diagnostic test results will improve Outcome: Progressing Goal: Respiratory complications will improve Outcome: Progressing Goal: Cardiovascular complication will be avoided Outcome: Progressing   Problem: Activity: Goal: Risk for activity intolerance will decrease Outcome: Progressing   Problem: Elimination: Goal: Will not experience complications related to bowel motility Outcome: Progressing Goal: Will not experience complications related to urinary retention Outcome: Progressing   Problem: Pain Managment: Goal: General experience of comfort will improve Outcome: Progressing   Problem: Safety: Goal: Ability to remain free from injury will improve Outcome: Progressing

## 2022-09-14 NOTE — Progress Notes (Signed)
Rounding Note    Patient Name: Kim [name] Date of Encounter: 09/14/2022  [facility] Cardiologist: Verne Carrow, MD   Subjective   Some improvement in breathing.   Inpatient Medications    Scheduled Meds:  albuterol  2.5 mg Nebulization Q4H   albuterol  15 mg/hr Nebulization Once   arformoterol  15 mcg Nebulization BID   aspirin EC  81 mg Oral Daily   bisoprolol  2.5 mg Oral Daily   budesonide (PULMICORT) nebulizer solution  0.5 mg Nebulization BID   dapagliflozin propanediol  10 mg Oral Daily   enoxaparin (LOVENOX) injection  40 mg Subcutaneous Q24H   ezetimibe  10 mg Oral Daily   guaiFENesin  600 mg Oral BID   levofloxacin  750 mg Oral Q48H   methylPREDNISolone (SOLU-MEDROL) injection  40 mg Intravenous Q24H   PARoxetine  40 mg Oral Daily   polyethylene glycol  17 g Oral Daily   revefenacin  175 mcg Nebulization Daily   rosuvastatin  20 mg Oral Daily   sodium chloride flush  3 mL Intravenous Q12H   spironolactone  12.5 mg Oral Daily   Continuous Infusions:  PRN Meds: albuterol, albuterol, ALPRAZolam, HYDROcodone-acetaminophen   Vital Signs    Vitals:   09/14/22 0008 09/14/22 0334 09/14/22 0416 09/14/22 0426  BP:    126/64  Pulse:  72  74  Resp:  19  20  Temp:    97.8 F (36.6 C)  TempSrc:    Oral  SpO2: 98% 99%  99%  Weight:   62.8 kg   Height:        Intake/Output Summary (Last 24 hours) at 09/14/2022 0736 Last data filed at 09/14/2022 5621 Gross per 24 hour  Intake 1057 ml  Output 1650 ml  Net -593 ml      09/14/2022    4:16 AM 09/13/2022    5:21 AM 09/12/2022    4:33 AM  Last 3 Weights  Weight (lbs) 138 lb 7.2 oz 145 lb 1 oz 145 lb 1 oz  Weight (kg) 62.8 kg 65.8 kg 65.8 kg      Telemetry    NSR - Personally Reviewed  ECG    N/a - Personally Reviewed  Physical Exam   GEN: No acute distress.   Neck: No JVD Cardiac: RRR, no murmurs, rubs, or gallops.  Respiratory: mild bilateral wheezing GI: Soft, nontender,  non-distended  MS: No edema; No deformity. Neuro:  Nonfocal  Psych: Normal affect   Labs    High Sensitivity Troponin:   Recent Labs  Lab 09/10/22 1101 09/10/22 1321 09/11/22 1216 09/11/22 1336  TROPONINIHS 67* 720* 1,169* 876*     Chemistry Recent Labs  Lab 09/10/22 1101 09/10/22 1106 09/10/22 1321 09/11/22 0147 09/12/22 0120 09/14/22 0119  NA 137   < >  --  139 140 140  K 3.3*   < >  --  3.4* 4.3 5.5*  CL 92*  --   --  98 104 105  CO2 31  --   --  28 30 29   GLUCOSE 353*  --   --  145* 134* 117*  BUN 33*  --   --  36* 32* 32*  CREATININE 1.62*  --   --  1.21* 1.11* 1.23*  CALCIUM 9.1  --   --  8.6* 8.9 9.8  MG  --   --  3.3*  --   --   --   PROT 6.1*  --   --  5.3*  --   --   ALBUMIN 3.6  --   --  3.0*  --   --   AST 41  --   --  41  --   --   ALT 27  --   --  28  --   --   ALKPHOS 66  --   --  55  --   --   BILITOT 0.6  --   --  0.4  --   --   GFRNONAA 34*  --   --  49* 54* 48*  ANIONGAP 14  --   --  13 6 6    < > = values in this interval not displayed.    Lipids No results for input(s): "CHOL", "TRIG", "HDL", "LABVLDL", "LDLCALC", "CHOLHDL" in the last 168 hours.  Hematology Recent Labs  Lab 09/12/22 0120 09/13/22 0107 09/14/22 0119  WBC 17.5* 16.9* 15.4*  RBC 3.50* 3.59* 3.57*  HGB 10.9* 11.2* 11.2*  HCT 34.3* 35.1* 35.9*  MCV 98.0 97.8 100.6*  MCH 31.1 31.2 31.4  MCHC 31.8 31.9 31.2  RDW 13.7 13.8 13.8  PLT 249 247 261   Thyroid No results for input(s): "TSH", "FREET4" in the last 168 hours.  BNP Recent Labs  Lab 09/10/22 1101  BNP 236.4*    DDimer No results for input(s): "DDIMER" in the last 168 hours.   Radiology    No results found.  Cardiac Studies     Patient Profile     67 y.o. female with history of non-obstructive CAD, takostubo cardiomyopathy, severe COPD with chronic respiratory failure, HLD, and tobacco abuse who presents to clinic with acute COPD exacerbation with course complicated by elevated trop for which Cardiology  was consulted.   Assessment & Plan    1.Elevated troponin - history of Takotsubo CM in both 2017 and 2022 in setting of COPD exacerbations. Cath during both presentations without significant CAD - LVEF recovered after both prior episodes, prior to admit echo 09/2021 LVEF 60-65%   -this admit presents with COPD exacerbation, elevated troponins, and recurrent apical WMA and LVEF down to 35% - Given recent negative cath in 2022 and history of takostubox2 in the setting of COPD exacerbation as detailed above, this is likely recurrent takostubo CM  - agree no plans for repeat ischemic testing, manage medically - medical therapy with farxiga 10mg , aldactone 12.5mg , bisoprolol 2.5mg . Soft bp's at times limit med options. Listed allergy to losartan with possible cause of severe hypotension Lisinopril caused a cough. Would avoid ACE/ARB/ARNI at this time.       2.NSTEMI - -Trop peak 1169 -Suspect this is secondary to recurrent takostubo as detailed above with no anginal symptoms and minimal disease on cath in 2022 -If EF remains depressed despite improvement from a COPD exacerbation over time, can plan for repeat ischemic evaluation at that time  - medical therapy with ASA 81, crestor 20   3. COPD exacerbation - per primary team   4. History of VT - followed by EP,continue bisoprolol - tele shows NSR   No additional cardiology recs at this time. Limitations on HF regimen as listed above, would continue current regimen. We will sign off inpatient care and arrange outpatient f/u  For questions or updates, please contact [facility] Please consult [email or web address] for contact info under        Signed, Dina Rich, MD  09/14/2022, 7:36 AM

## 2022-09-15 ENCOUNTER — Telehealth (HOSPITAL_COMMUNITY): Payer: Self-pay

## 2022-09-15 DIAGNOSIS — J9622 Acute and chronic respiratory failure with hypercapnia: Secondary | ICD-10-CM | POA: Diagnosis not present

## 2022-09-15 DIAGNOSIS — J9621 Acute and chronic respiratory failure with hypoxia: Secondary | ICD-10-CM | POA: Diagnosis not present

## 2022-09-15 LAB — BASIC METABOLIC PANEL
Anion gap: 10 (ref 5–15)
BUN: 29 mg/dL — ABNORMAL HIGH (ref 8–23)
CO2: 26 mmol/L (ref 22–32)
Calcium: 9.9 mg/dL (ref 8.9–10.3)
Chloride: 103 mmol/L (ref 98–111)
Creatinine, Ser: 1.09 mg/dL — ABNORMAL HIGH (ref 0.44–1.00)
GFR, Estimated: 55 mL/min — ABNORMAL LOW (ref >60)
Glucose, Bld: 129 mg/dL — ABNORMAL HIGH (ref 70–99)
Potassium: 4.9 mmol/L (ref 3.5–5.1)
Sodium: 139 mmol/L (ref 135–145)

## 2022-09-15 LAB — CBC
HCT: 35.6 % — ABNORMAL LOW (ref 36.0–46.0)
Hemoglobin: 11.3 g/dL — ABNORMAL LOW (ref 12.0–15.0)
MCH: 31.4 pg (ref 26.0–34.0)
MCHC: 31.7 g/dL (ref 30.0–36.0)
MCV: 98.9 fL (ref 80.0–100.0)
Platelets: 248 10*3/uL (ref 150–400)
RBC: 3.6 MIL/uL — ABNORMAL LOW (ref 3.87–5.11)
RDW: 13.8 % (ref 11.5–15.5)
WBC: 15.9 10*3/uL — ABNORMAL HIGH (ref 4.0–10.5)
nRBC: 0 % (ref 0.0–0.2)

## 2022-09-15 NOTE — Telephone Encounter (Signed)
Heart Failure Patient Advocate Encounter  Medication assistance forms for Kim [name] have been started. Patient has signed forms.  Provider information will need to be completed before submitting.  Forms have been attached to patient chart under 'Media' tab. Routing to appropriate office.  [name], CPhT Rx Patient Advocate Phone: [phone]

## 2022-09-15 NOTE — Progress Notes (Signed)
   Heart Failure Stewardship Pharmacist Progress Note   PCP: Shirline Frees, NP PCP-Cardiologist: Verne Carrow, MD   HPI:  [name] with PMH of CHF with recovered EF, COPD on chronic O2, HTN, CAD, SAH, and orthostatic hypotension.  She presented to the ED on 6/5 with shortness of breath and as code STEMI. Per MD, STEMI cancelled. She was seen in urgent care 2 days prior and was given a Z-pack. In the ED, was tachycardic, CXR with hyperinflation but no acute abnormality, BNP elevated, troponin elevated. Cardiology and pulmonary have been consulted. Prior ECHO in 09/2021 with EF 60-65% (improved from 35-40% in 08/2021 due to stress cardiomyopathy). ECHO 6/6 showed LVEF 35-40%, no regional wall motion abnormalities, G1DD, RV normal. Findings most likely consistent with recurrent Takotsubo pattern.   Current HF Medications: Beta Blocker: bisoprolol 2.5 mg daily MRA: spironolactone 12.5 mg daily SGLT2i: Farxiga 10 mg daily  Prior to admission HF Medications: Diuretic: furosemide 40 mg daily Beta blocker: bisoprolol 2.5 mg daily PRN for breakthrough chest pain  Pertinent Lab Values: Serum creatinine 1.09, BUN 29, Potassium 4.9, Sodium 139, BNP 236.4, Magnesium 3.3, A1c 6.4   Vital Signs: Weight: 141 lbs (admission weight 145 lbs) Blood pressure: 110/60s  Heart rate: 60-70s  I/O: -1.1L yesterday; net -1.6L  Medication Assistance / Insurance Benefits Check: Does the patient have prescription insurance?  Yes Type of insurance plan: BCBS Medicare  Does the patient qualify for medication assistance through manufacturers or grants?   Yes Eligible grants and/or patient assistance programs: Farxiga Medication assistance applications in progress: Farxiga  Medication assistance applications approved: none Approved medication assistance renewals will be completed by: pending  Outpatient Pharmacy:  Prior to admission outpatient pharmacy: [facility] Is the patient willing to use [facility] TOC  pharmacy at discharge? Yes Is the patient willing to transition their outpatient pharmacy to utilize a [facility] outpatient pharmacy?   No   Assessment: 1. Acute on chronic systolic CHF (LVEF 35-40%), due to recurrent Takotsubo cardiomyopathy. NYHA class II symptoms. - Does not appear volume overloaded on exam, shortness of breath thought to be related to COPD. Off IV lasix. Strict I/Os and daily weights. Keep K>4 and Mg>2. - Continue bisoprolol 2.5 mg daily  - BP too soft for ARB/ARNI. Has a history of orthostatic hypotension and falls. - Continue spironolactone 12.5 mg daily - Continue Farxiga 10 mg daily  Plan: 1) Medication changes recommended at this time: - Continue current regimen  2) Patient assistance: - Has $545 left on deductible - Initial copay for Farxiga $202 - starting patient assistance application today  3)  Education  - Patient has been educated on current HF medications and potential additions to HF medication regimen - Patient verbalizes understanding that over the next few months, these medication doses may change and more medications may be added to optimize HF regimen - Patient has been educated on basic disease state pathophysiology and goals of therapy   Sharen Hones, PharmD, BCPS Heart Failure Stewardship Pharmacist Phone [phone]

## 2022-09-15 NOTE — Progress Notes (Signed)
PROGRESS NOTE    Kim [name]  YQM:[id number] DOB: 30-Jan-1956 DOA: 09/10/2022 PCP: Shirline Frees, NP     Brief Narrative:  66/F with severe COPD, chronic respiratory failure on 2 L home O2 and NIV, diastolic CHF, history of Takotsubo stress-induced cardiomyopathy, presented with shortness of breath.  She also uses BiPAP/ventilation at night.   -Patient presented to the emergency department because of worsening shortness of breath.  EMS put on nonrebreather.  Given bronchodilator treatment and Solu-Medrol.  On presentation, blood pressure was stable.  She was put on BiPAP.  Lab work showed potassium 3.3, creatinine of 1.6, WBC count of 26.6, hemoglobin, troponin 67 which increased to 700s.  Cardiology /pulmonology consulted and following and currently being managed for severe COPD exacerbation  -Echo noted drop in EF to 35%, cards following, recommended medical management, suspected to be Takotsubo's again  New events last 24 hours / Subjective: -Feels better, breathing is slowly improving  Assessment & Plan:   Acute on chronic respiratory failure with hypoxia and hypercapnia (HCC) COPD exacerbation -Slowly improving, continue IV Solu-Medrol, Levaquin to complete 5-day course -Wean O2 slowly as tolerated -Continue DuoNebs, budesonide/Brovana and Yupelri -Transition to prednisone 40 Mg daily with slow taper, will need close pulmonary follow-up   Chronic systolic and diastolic CHF -History of Takotsubo cardiomyopathy in 2017 and 2022 in the setting of COPD exacerbations, cath during both admissions without significant CAD, LVEF recovered and was normal in 6/23 -Now back with COPD exacerbation, echo with EF down to 35% and rec recurrent apical WMA -Cards consulted, plan for medical management, continue Farxiga Aldactone and bisoprolol -GDMT limited by hypotension -Will need close follow-up with cardiology  NSTEMI -Troponin peaked at 1169, felt to be secondary to recurrent decreased  Takotsubo, see discussion above minimal CAD on cath 2022 -Cards recommended medical management, continue aspirin Crestor and low-dose bisoprolol  History of VT -Continue bisoprolol     Dyslipidemia  Crestor 20 mg daily  Zetia 10 mg daily    Anxiety and depression  Paxil 40 mg daily  Xanax as needed, try to limit use    Coronary artery calcification seen on CAT scan  Crestor 20 mg daily  Aspirin     AKI (acute kidney injury) (HCC)  Monitor BMP, though currently resolved  DVT prophylaxis: Refusing Lovenox, will have her wear SCDs.  Code Status: DNR Family Communication: Husband bedside Coming From: Home Disposition Plan: Home likely 1 to 2 days Barriers to Discharge: Improvement  Consultants:  Cardiology  Antimicrobials:  Anti-infectives (From admission, onward)    Start     Dose/Rate Route Frequency Ordered Stop   09/11/22 1000  levofloxacin (LEVAQUIN) tablet 750 mg        750 mg Oral Every 48 hours 09/11/22 0802          Objective: Vitals:   09/15/22 0309 09/15/22 0510 09/15/22 0737 09/15/22 0812  BP:  (!) 105/50 (!) 117/58   Pulse: (!) 59 63 65   Resp: 20 18 20    Temp:  97.9 F (36.6 C) 98.1 F (36.7 C)   TempSrc:  Oral Oral   SpO2: 98% 99% 99% 99%  Weight:  64 kg    Height:        Intake/Output Summary (Last 24 hours) at 09/15/2022 1003 Last data filed at 09/15/2022 0828 Gross per 24 hour  Intake 700 ml  Output 1525 ml  Net -825 ml   Filed Weights   09/13/22 0521 09/14/22 0416 09/15/22 0510  Weight: 65.8  kg 62.8 kg 64 kg    Examination:  General exam: Chronically ill female sitting up in bed, AAOx3, no distress HEENT: No JVD CVS: S1-S2, regular rhythm Lungs: Poor air movement bilaterally, rare expiratory wheezes Abdomen: Soft, nontender, bowel sounds present Extremities: No edema   Data Reviewed: I have personally reviewed following labs and imaging studies  CBC: Recent Labs  Lab 09/10/22 1101 09/10/22 1106 09/11/22 0147  09/12/22 0120 09/13/22 0107 09/14/22 0119 09/15/22 0104  WBC 26.6*  --  15.8* 17.5* 16.9* 15.4* 15.9*  NEUTROABS 14.4*  --   --   --   --   --   --   HGB 13.0   < > 11.4* 10.9* 11.2* 11.2* 11.3*  HCT 41.8   < > 35.1* 34.3* 35.1* 35.9* 35.6*  MCV 103.5*  --  99.7 98.0 97.8 100.6* 98.9  PLT 301  --  226 249 247 261 248   < > = values in this interval not displayed.   Basic Metabolic Panel: Recent Labs  Lab 09/10/22 1321 09/11/22 0147 09/12/22 0120 09/14/22 0119 09/14/22 0941 09/15/22 0104  NA  --  139 140 140 141 139  K  --  3.4* 4.3 5.5* 4.6 4.9  CL  --  98 104 105 107 103  CO2  --  28 30 29 26 26   GLUCOSE  --  145* 134* 117* 124* 129*  BUN  --  36* 32* 32* 28* 29*  CREATININE  --  1.21* 1.11* 1.23* 1.08* 1.09*  CALCIUM  --  8.6* 8.9 9.8 9.6 9.9  MG 3.3*  --   --   --   --   --    GFR: Estimated Creatinine Clearance: 42.9 mL/min (A) (by C-G formula based on SCr of 1.09 mg/dL (H)). Liver Function Tests: Recent Labs  Lab 09/10/22 1101 09/11/22 0147  AST 41 41  ALT 27 28  ALKPHOS 66 55  BILITOT 0.6 0.4  PROT 6.1* 5.3*  ALBUMIN 3.6 3.0*   BNP (last 3 results) Recent Labs    10/04/21 1436  PROBNP 76.0    Recent Results (from the past 240 hour(s))  Respiratory (~20 pathogens) panel by PCR     Status: None   Collection Time: 09/10/22 12:41 PM   Specimen: Nasopharyngeal Swab; Respiratory  Result Value Ref Range Status   Adenovirus NOT DETECTED NOT DETECTED Final   Coronavirus 229E NOT DETECTED NOT DETECTED Final    Comment: (NOTE) The Coronavirus on the Respiratory Panel, DOES NOT test for the novel  Coronavirus (2019 nCoV)    Coronavirus HKU1 NOT DETECTED NOT DETECTED Final   Coronavirus NL63 NOT DETECTED NOT DETECTED Final   Coronavirus OC43 NOT DETECTED NOT DETECTED Final   Metapneumovirus NOT DETECTED NOT DETECTED Final   Rhinovirus / Enterovirus NOT DETECTED NOT DETECTED Final   Influenza A NOT DETECTED NOT DETECTED Final   Influenza B NOT DETECTED  NOT DETECTED Final   Parainfluenza Virus 1 NOT DETECTED NOT DETECTED Final   Parainfluenza Virus 2 NOT DETECTED NOT DETECTED Final   Parainfluenza Virus 3 NOT DETECTED NOT DETECTED Final   Parainfluenza Virus 4 NOT DETECTED NOT DETECTED Final   Respiratory Syncytial Virus NOT DETECTED NOT DETECTED Final   Bordetella pertussis NOT DETECTED NOT DETECTED Final   Bordetella Parapertussis NOT DETECTED NOT DETECTED Final   Chlamydophila pneumoniae NOT DETECTED NOT DETECTED Final   Mycoplasma pneumoniae NOT DETECTED NOT DETECTED Final    Comment: Performed at [facility], [address] [place], [place] [address]  Resp Panel by RT-PCR (Flu A&B, Covid)     Status: None   Collection Time: 09/10/22 12:42 PM  Result Value Ref Range Status   SARS Coronavirus 2 by RT PCR NEGATIVE NEGATIVE Final   Influenza A by PCR NEGATIVE NEGATIVE Final   Influenza B by PCR NEGATIVE NEGATIVE Final    Comment: (NOTE) The Xpert Xpress SARS-CoV-2/FLU/RSV plus assay is intended as an aid in the diagnosis of influenza from Nasopharyngeal swab specimens and should not be used as a sole basis for treatment. Nasal washings and aspirates are unacceptable for Xpert Xpress SARS-CoV-2/FLU/RSV testing.  Fact Sheet for Patients: BloggerCourse.com  Fact Sheet for Healthcare Providers: SeriousBroker.it  This test is not yet approved or cleared by the [place] FDA and has been authorized for detection and/or diagnosis of SARS-CoV-2 by FDA under an Emergency Use Authorization (EUA). This EUA will remain in effect (meaning this test can be used) for the duration of the COVID-19 declaration under Section 564(b)(1) of the Act, 21 U.S.C. section 360bbb-3(b)(1), unless the authorization is terminated or revoked.  Performed at [facility], [address], [place], [place] [address]       Radiology Studies: No results found.   Scheduled Meds:   albuterol  2.5 mg Nebulization Q6H   albuterol  15 mg/hr Nebulization Once   arformoterol  15 mcg Nebulization BID   aspirin EC  81 mg Oral Daily   bisoprolol  2.5 mg Oral Daily   budesonide (PULMICORT) nebulizer solution  0.5 mg Nebulization BID   dapagliflozin propanediol  10 mg Oral Daily   enoxaparin (LOVENOX) injection  40 mg Subcutaneous Q24H   ezetimibe  10 mg Oral Daily   guaiFENesin  600 mg Oral BID   levofloxacin  750 mg Oral Q48H   methylPREDNISolone (SOLU-MEDROL) injection  40 mg Intravenous Q24H   nystatin  5 mL Oral QID   PARoxetine  40 mg Oral Daily   polyethylene glycol  17 g Oral Daily   revefenacin  175 mcg Nebulization Daily   rosuvastatin  20 mg Oral Daily   sodium chloride flush  3 mL Intravenous Q12H   spironolactone  12.5 mg Oral Daily   Continuous Infusions:   LOS: 4 days    Time spent:   Zannie Cove, MD [facility] 09/15/2022, 10:03 AM   Available via Epic secure chat 7am-7pm After these hours, please refer to coverage provider listed on [email or web address]

## 2022-09-15 NOTE — Progress Notes (Signed)
Physical Therapy Treatment Patient Details Name: Kim [name] MRN: [id number] DOB: 1955/06/30 Today's Date: 09/15/2022   History of Present Illness Pt is 67 year old presented to [facility] on  09/10/22 for acute on chronic respiratory failure with hypoxia due to copd exacerbation. Pt also with acute on chronic heart failrue and suspected recurrent Takostubo cardiomyopathy and NSTEMI. PMH - copd, chronic resp failure on 2-3L O2,  cad, htn, chf, Takostubo cardiomyopathy, SAH, orthostatic hypotension, anxiety, depression    PT Comments    Pt making slow, steady progress. Able to amb short distance in room. Pt premedicated with xanax for anxiety. Expect continued steady progress and pt plans to return home with supportive husband.    Recommendations for follow up therapy are one component of a multi-disciplinary discharge planning process, led by the attending physician.  Recommendations may be updated based on patient status, additional functional criteria and insurance authorization.  Follow Up Recommendations       Assistance Recommended at Discharge Frequent or constant Supervision/Assistance  Patient can return home with the following Help with stairs or ramp for entrance;Assist for transportation;A little help with bathing/dressing/bathroom;Assistance with cooking/housework;A little help with walking and/or transfers   Equipment Recommendations  None recommended by PT    Recommendations for Other Services       Precautions / Restrictions Precautions Precautions: Fall;Other (comment) Precaution Comments: Pt with history of syncope from orthostatic hypotension     Mobility  Bed Mobility Overal bed mobility: Needs Assistance Bed Mobility: Supine to Sit, Sit to Supine     Supine to sit: HOB elevated, Min guard Sit to supine: Min assist   General bed mobility comments: Assist for safety and lines coming to sitting. Assist to bring legs back into bed returning to supine     Transfers Overall transfer level: Needs assistance Equipment used: None Transfers: Sit to/from Stand Sit to Stand: Min guard           General transfer comment: Assist for safety and lines    Ambulation/Gait Ambulation/Gait assistance: Min assist Gait Distance (Feet): 15 Feet (5' x 1, 15' x 1) Assistive device: Rollator (4 wheels) Gait Pattern/deviations: Step-through pattern, Decreased step length - right, Decreased step length - left Gait velocity: decr Gait velocity interpretation: <1.31 ft/sec, indicative of household ambulator   General Gait Details: Assist for support. On initial gait pt tremulous and unstable. Sat and rested and improved on 2nd time.   Stairs             Wheelchair Mobility    Modified Rankin (Stroke Patients Only)       Balance Overall balance assessment: Needs assistance Sitting-balance support: No upper extremity supported, Feet supported Sitting balance-Leahy Scale: Good     Standing balance support: Bilateral upper extremity supported Standing balance-Leahy Scale: Poor Standing balance comment: UE support and min guard                            Cognition Arousal/Alertness: Awake/alert Behavior During Therapy: Anxious Overall Cognitive Status: Within Functional Limits for tasks assessed                                          Exercises      General Comments General comments (skin integrity, edema, etc.): VSS on 2L at rest and incr to 3L with mobility. SpO2 96% after  short ambulation on 3L      Pertinent Vitals/Pain Pain Assessment Pain Assessment: No/denies pain    Home Living                          Prior Function            PT Goals (current goals can now be found in the care plan section) Acute Rehab PT Goals Patient Stated Goal: return home Progress towards PT goals: Progressing toward goals    Frequency    Min 1X/week      PT Plan Current plan remains  appropriate    Co-evaluation              AM-PAC PT "6 Clicks" Mobility   Outcome Measure  Help needed turning from your back to your side while in a flat bed without using bedrails?: None Help needed moving from lying on your back to sitting on the side of a flat bed without using bedrails?: A Little Help needed moving to and from a bed to a chair (including a wheelchair)?: A Little Help needed standing up from a chair using your arms (e.g., wheelchair or bedside chair)?: A Little Help needed to walk in hospital room?: Total Help needed climbing 3-5 steps with a railing? : Total 6 Click Score: 15    End of Session Equipment Utilized During Treatment: Oxygen;Gait belt Activity Tolerance: Treatment limited secondary to medical complications (Comment) (SOB) Patient left: with call bell/phone within reach;in bed;with bed alarm set;with family/visitor present   PT Visit Diagnosis: Other abnormalities of gait and mobility (R26.89);Muscle weakness (generalized) (M62.81)     Time: 1610-9604 PT Time Calculation (min) (ACUTE ONLY): 24 min  Charges:  $Gait Training: 23-37 mins                     [facility] PT Acute Rehabilitation Services Office [phone]    Angelina Ok [facility] 09/15/2022, 4:41 PM

## 2022-09-15 NOTE — Consult Note (Signed)
WOC Nurse Consult Note: patient admitted with bilateral forearm skin tears; has been managing these at home and states she just did the wound care she does at home for them and defers to have this RN look at them; there is a photo in chart from 6/3 and patient showed me photo on her phone for L forearm  Reason for Consult: bilateral upper extremity skin tears  Wound type: Partial thickness, traumatic  Pressure Injury POA: NA  Measurement: see nursing flow sheet  Wound bed: 100%  pink and moist L forearm per photos, patient states R arm is not open but has healed  Drainage (amount, consistency, odor) see nursing flowsheet  Periwound: Ecchymosis noted to bilateral arms  Dressing procedure/placement/frequency:  Clean bilateral forearm skin tears with NS, apply Vaseline gauze [place] #239) daily, cover Vaseline gauze with Telfa non-stick dressing and wrap with Kerlix roll gauze.  This is the wound care patient has been performing at home and is appropriate for inpatient as well.   POC discussed with patient and bedside nurse.  WOC team will not follow at this time. Re-consult if further needs arise.   Thank you,    Priscella Mann MSN, RN-BC, [employer] [phone]

## 2022-09-15 NOTE — Progress Notes (Signed)
   09/15/22 2256  BiPAP/CPAP/SIPAP  $ Non-Invasive Home Ventilator  Subsequent  BiPAP/CPAP/SIPAP Pt Type Adult  BiPAP/CPAP/SIPAP DREAMSTATIOND  Mask Type Full face mask  Mask Size Medium  IPAP 10 cmH20  EPAP 8 cmH2O  Flow Rate 2 lpm (bled in)  BiPAP/CPAP /SiPAP Vitals  Pulse Rate 67  Resp 18  SpO2 97 %

## 2022-09-16 DIAGNOSIS — J441 Chronic obstructive pulmonary disease with (acute) exacerbation: Secondary | ICD-10-CM | POA: Diagnosis not present

## 2022-09-16 DIAGNOSIS — J9622 Acute and chronic respiratory failure with hypercapnia: Secondary | ICD-10-CM | POA: Diagnosis not present

## 2022-09-16 DIAGNOSIS — J9621 Acute and chronic respiratory failure with hypoxia: Secondary | ICD-10-CM | POA: Diagnosis not present

## 2022-09-16 LAB — BASIC METABOLIC PANEL
Anion gap: 10 (ref 5–15)
BUN: 29 mg/dL — ABNORMAL HIGH (ref 8–23)
CO2: 26 mmol/L (ref 22–32)
Calcium: 10.3 mg/dL (ref 8.9–10.3)
Chloride: 100 mmol/L (ref 98–111)
Creatinine, Ser: 1.25 mg/dL — ABNORMAL HIGH (ref 0.44–1.00)
GFR, Estimated: 47 mL/min — ABNORMAL LOW (ref >60)
Glucose, Bld: 126 mg/dL — ABNORMAL HIGH (ref 70–99)
Potassium: 4.5 mmol/L (ref 3.5–5.1)
Sodium: 136 mmol/L (ref 135–145)

## 2022-09-16 LAB — CBC
HCT: 37.5 % (ref 36.0–46.0)
Hemoglobin: 11.7 g/dL — ABNORMAL LOW (ref 12.0–15.0)
MCH: 30.7 pg (ref 26.0–34.0)
MCHC: 31.2 g/dL (ref 30.0–36.0)
MCV: 98.4 fL (ref 80.0–100.0)
Platelets: 267 10*3/uL (ref 150–400)
RBC: 3.81 MIL/uL — ABNORMAL LOW (ref 3.87–5.11)
RDW: 13.8 % (ref 11.5–15.5)
WBC: 16.5 10*3/uL — ABNORMAL HIGH (ref 4.0–10.5)
nRBC: 0 % (ref 0.0–0.2)

## 2022-09-16 MED ORDER — FUROSEMIDE 10 MG/ML IJ SOLN
40.0000 mg | Freq: Once | INTRAMUSCULAR | Status: AC
  Administered 2022-09-16: 40 mg via INTRAVENOUS
  Filled 2022-09-16: qty 4

## 2022-09-16 MED ORDER — ORAL CARE MOUTH RINSE: 15.0000 mL | OROMUCOSAL | Status: DC | PRN

## 2022-09-16 NOTE — Progress Notes (Addendum)
   Heart Failure Stewardship Pharmacist Progress Note   PCP: Shirline Frees, NP PCP-Cardiologist: Verne Carrow, MD   HPI:  [name] with PMH of CHF with recovered EF, COPD on chronic O2, HTN, CAD, SAH, and orthostatic hypotension.  She presented to the ED on 6/5 with shortness of breath and as code STEMI. Per MD, STEMI cancelled. She was seen in urgent care 2 days prior and was given a Z-pack. In the ED, was tachycardic, CXR with hyperinflation but no acute abnormality, BNP elevated, troponin elevated. Cardiology and pulmonary have been consulted. Prior ECHO in 09/2021 with EF 60-65% (improved from 35-40% in 08/2021 due to stress cardiomyopathy). ECHO 6/6 showed LVEF 35-40%, no regional wall motion abnormalities, G1DD, RV normal. Findings most likely consistent with recurrent Takotsubo pattern.   Current HF Medications: Diuretic: furosemide 40 mg IV x 1 Beta Blocker: bisoprolol 2.5 mg daily MRA: spironolactone 12.5 mg daily SGLT2i: Farxiga 10 mg daily  Prior to admission HF Medications: Diuretic: furosemide 40 mg daily Beta blocker: bisoprolol 2.5 mg daily PRN for breakthrough chest pain  Pertinent Lab Values: Serum creatinine 1.25, BUN 29, Potassium 4.5, Sodium 136, BNP 236.4, Magnesium 3.3, A1c 6.4   Vital Signs: Weight: 143 lbs (admission weight 145 lbs) Blood pressure: 100-110/50-60s  Heart rate: 50-60s  I/O: -1.1L yesterday; net -2.8L  Medication Assistance / Insurance Benefits Check: Does the patient have prescription insurance?  Yes Type of insurance plan: BCBS Medicare  Does the patient qualify for medication assistance through manufacturers or grants?   Yes Eligible grants and/or patient assistance programs: Farxiga Medication assistance applications in progress: Farxiga  Medication assistance applications approved: none Approved medication assistance renewals will be completed by: [facility]  Outpatient Pharmacy:  Prior to admission outpatient pharmacy:  [facility] Is the patient willing to use [facility] TOC pharmacy at discharge? Yes Is the patient willing to transition their outpatient pharmacy to utilize a [facility] outpatient pharmacy?   No   Assessment: 1. Acute on chronic systolic CHF (LVEF 35-40%), due to recurrent Takotsubo cardiomyopathy. NYHA class II symptoms. - Agree with furosemide 40 mg IV x 1 today. Weight increasing. Strict I/Os and daily weights. Keep K>4 and Mg>2. - Continue bisoprolol 2.5 mg daily  - BP too soft for ARB/ARNI. Has a history of orthostatic hypotension and falls. - Continue spironolactone 12.5 mg daily - Continue Farxiga 10 mg daily  Plan: 1) Medication changes recommended at this time: - Agree with changes  2) Patient assistance: - Has $545 left on deductible - Initial copay for Farxiga $202 - patient assistance application started, pending provider signature and submission  3)  Education  - Patient has been educated on current HF medications and potential additions to HF medication regimen - Patient verbalizes understanding that over the next few months, these medication doses may change and more medications may be added to optimize HF regimen - Patient has been educated on basic disease state pathophysiology and goals of therapy   Sharen Hones, PharmD, BCPS Heart Failure Stewardship Pharmacist Phone [phone]

## 2022-09-16 NOTE — Progress Notes (Signed)
   09/16/22 2002  BiPAP/CPAP/SIPAP  Reason BIPAP/CPAP not in use Non-compliant (pt refused cpap)

## 2022-09-16 NOTE — Progress Notes (Signed)
PROGRESS NOTE    Kim [name]  WUJ:[id number] DOB: [birth year] DOA: 09/10/2022 PCP: Shirline Frees, NP     Brief Narrative:  66/F with severe COPD, chronic respiratory failure on 2 L home O2 and NIV, diastolic CHF, history of Takotsubo stress-induced cardiomyopathy, presented with shortness of breath.  -Patient presented to the ED placed on nonrebreather, given bronchodilators steroids and followed by BiPAP on admission -Lab-creatinine of 1.6, WBC count of 26.6, troponin 67 which increased to 700s.  Cardiology /pulmonology consulted and following and currently being managed for severe COPD exacerbation  -Echo noted drop in EF to 35%, cards following, recommended medical management, suspected to be Takotsubo's again  New events last 24 hours / Subjective: -Feels like her breathing is marginally better, laying in bed taking a nap after breakfast, dyspneic with minimal activity  Assessment & Plan:   Acute on chronic respiratory failure with hypoxia and hypercapnia (HCC) COPD exacerbation -Very slow clinical improvement, continue IV steroids again today, completes 5 days of Levaquin, DC antibiotics -Wean O2 slowly as tolerated -Continue DuoNebs, budesonide/Brovana and Yupelri -Transition to prednisone 40 Mg daily with slow taper, will need close pulmonary follow-up -Very poor pulmonary reserve, may need palliative care involvement as outpatient -Continue BiPAP nightly   Chronic systolic and diastolic CHF -History of Takotsubo cardiomyopathy in 2017 and 2022 in the setting of COPD exacerbations, cath during both admissions without significant CAD, LVEF recovered and was normal in 6/23 -Now back with COPD exacerbation, echo with EF down to 35% and recurrent apical WMA -Cards consulted, plan for medical management, continue Farxiga Aldactone and bisoprolol -GDMT limited by hypotension -Lasix x 1 today to keep net negative -Will need close follow-up with cardiology  NSTEMI -Troponin  peaked at 1169, felt to be secondary to recurrent decreased Takotsubo, see discussion above minimal CAD on cath 2022 -Cards recommended medical management, continue aspirin Crestor and low-dose bisoprolol  History of VT -Continue bisoprolol     Dyslipidemia  Crestor 20 mg daily  Zetia 10 mg daily    Anxiety and depression  Paxil 40 mg daily  Xanax as needed, try to limit use    Coronary artery calcification seen on CAT scan  Crestor 20 mg daily  Aspirin     AKI (acute kidney injury) (HCC)  Monitor BMP, though currently resolved  DVT prophylaxis: Currently declining Lovenox, SCDs Code Status: DNR Family Communication: Husband bedside Disposition Plan: Home likely 1 to 2 days  Consultants:  Cardiology  Antimicrobials:  Anti-infectives (From admission, onward)    Start     Dose/Rate Route Frequency Ordered Stop   09/11/22 1000  levofloxacin (LEVAQUIN) tablet 750 mg  Status:  Discontinued        750 mg Oral Every 48 hours 09/11/22 0802 09/15/22 1150        Objective: Vitals:   09/16/22 0106 09/16/22 0440 09/16/22 0753 09/16/22 0832  BP: (!) 100/54 (!) 99/51 113/63   Pulse: 62 (!) 58    Resp: 18 18 18    Temp: 98.1 F (36.7 C) 98.4 F (36.9 C) 98.3 F (36.8 C)   TempSrc: Oral Oral Oral   SpO2: 98% 99% 98% 98%  Weight:  65 kg    Height:        Intake/Output Summary (Last 24 hours) at 09/16/2022 1140 Last data filed at 09/16/2022 1051 Gross per 24 hour  Intake 600 ml  Output 3300 ml  Net -2700 ml   Filed Weights   09/14/22 0416 09/15/22 0510 09/16/22 0440  Weight: 62.8 kg 64 kg 65 kg    Examination:   General exam: Chronically ill female sitting up in bed, tachypneic with minimal activity, AAOx3 HEENT: Positive JVD CVS: S1-S2, regular rhythm Lungs: Very poor air movement bilaterally,, rare expiratory wheezes Abdomen: Soft, nontender, bowel sounds present Extremities: No edema  Data Reviewed: I have personally reviewed following labs and imaging  studies  CBC: Recent Labs  Lab 09/10/22 1101 09/10/22 1106 09/12/22 0120 09/13/22 0107 09/14/22 0119 09/15/22 0104 09/16/22 0056  WBC 26.6*   < > 17.5* 16.9* 15.4* 15.9* 16.5*  NEUTROABS 14.4*  --   --   --   --   --   --   HGB 13.0   < > 10.9* 11.2* 11.2* 11.3* 11.7*  HCT 41.8   < > 34.3* 35.1* 35.9* 35.6* 37.5  MCV 103.5*   < > 98.0 97.8 100.6* 98.9 98.4  PLT 301   < > 249 247 261 248 267   < > = values in this interval not displayed.   Basic Metabolic Panel: Recent Labs  Lab 09/10/22 1321 09/11/22 0147 09/12/22 0120 09/14/22 0119 09/14/22 0941 09/15/22 0104 09/16/22 0056  NA  --    < > 140 140 141 139 136  K  --    < > 4.3 5.5* 4.6 4.9 4.5  CL  --    < > 104 105 107 103 100  CO2  --    < > 30 29 26 26 26   GLUCOSE  --    < > 134* 117* 124* 129* 126*  BUN  --    < > 32* 32* 28* 29* 29*  CREATININE  --    < > 1.11* 1.23* 1.08* 1.09* 1.25*  CALCIUM  --    < > 8.9 9.8 9.6 9.9 10.3  MG 3.3*  --   --   --   --   --   --    < > = values in this interval not displayed.   GFR: Estimated Creatinine Clearance: 37.7 mL/min (A) (by C-G formula based on SCr of 1.25 mg/dL (H)). Liver Function Tests: Recent Labs  Lab 09/10/22 1101 09/11/22 0147  AST 41 41  ALT 27 28  ALKPHOS 66 55  BILITOT 0.6 0.4  PROT 6.1* 5.3*  ALBUMIN 3.6 3.0*   BNP (last 3 results) Recent Labs    10/04/21 1436  PROBNP 76.0    Recent Results (from the past 240 hour(s))  Respiratory (~20 pathogens) panel by PCR     Status: None   Collection Time: 09/10/22 12:41 PM   Specimen: Nasopharyngeal Swab; Respiratory  Result Value Ref Range Status   Adenovirus NOT DETECTED NOT DETECTED Final   Coronavirus 229E NOT DETECTED NOT DETECTED Final    Comment: (NOTE) The Coronavirus on the Respiratory Panel, DOES NOT test for the novel  Coronavirus (2019 nCoV)    Coronavirus HKU1 NOT DETECTED NOT DETECTED Final   Coronavirus NL63 NOT DETECTED NOT DETECTED Final   Coronavirus OC43 NOT DETECTED NOT  DETECTED Final   Metapneumovirus NOT DETECTED NOT DETECTED Final   Rhinovirus / Enterovirus NOT DETECTED NOT DETECTED Final   Influenza A NOT DETECTED NOT DETECTED Final   Influenza B NOT DETECTED NOT DETECTED Final   Parainfluenza Virus 1 NOT DETECTED NOT DETECTED Final   Parainfluenza Virus 2 NOT DETECTED NOT DETECTED Final   Parainfluenza Virus 3 NOT DETECTED NOT DETECTED Final   Parainfluenza Virus 4 NOT DETECTED NOT DETECTED Final  Respiratory Syncytial Virus NOT DETECTED NOT DETECTED Final   Bordetella pertussis NOT DETECTED NOT DETECTED Final   Bordetella Parapertussis NOT DETECTED NOT DETECTED Final   Chlamydophila pneumoniae NOT DETECTED NOT DETECTED Final   Mycoplasma pneumoniae NOT DETECTED NOT DETECTED Final    Comment: Performed at [facility], [address], [place], [place] [address]  Resp Panel by RT-PCR (Flu A&B, Covid)     Status: None   Collection Time: 09/10/22 12:42 PM  Result Value Ref Range Status   SARS Coronavirus 2 by RT PCR NEGATIVE NEGATIVE Final   Influenza A by PCR NEGATIVE NEGATIVE Final   Influenza B by PCR NEGATIVE NEGATIVE Final    Comment: (NOTE) The Xpert Xpress SARS-CoV-2/FLU/RSV plus assay is intended as an aid in the diagnosis of influenza from Nasopharyngeal swab specimens and should not be used as a sole basis for treatment. Nasal washings and aspirates are unacceptable for Xpert Xpress SARS-CoV-2/FLU/RSV testing.  Fact Sheet for Patients: BloggerCourse.com  Fact Sheet for Healthcare Providers: SeriousBroker.it  This test is not yet approved or cleared by the [place] FDA and has been authorized for detection and/or diagnosis of SARS-CoV-2 by FDA under an Emergency Use Authorization (EUA). This EUA will remain in effect (meaning this test can be used) for the duration of the COVID-19 declaration under Section 564(b)(1) of the Act, 21 U.S.C. section 360bbb-3(b)(1), unless the  authorization is terminated or revoked.  Performed at [facility], [address] [place], [place] [address]       Radiology Studies: No results found.   Scheduled Meds:  albuterol  2.5 mg Nebulization Q6H   albuterol  15 mg/hr Nebulization Once   arformoterol  15 mcg Nebulization BID   aspirin EC  81 mg Oral Daily   bisoprolol  2.5 mg Oral Daily   budesonide (PULMICORT) nebulizer solution  0.5 mg Nebulization BID   dapagliflozin propanediol  10 mg Oral Daily   enoxaparin (LOVENOX) injection  40 mg Subcutaneous Q24H   ezetimibe  10 mg Oral Daily   guaiFENesin  600 mg Oral BID   methylPREDNISolone (SOLU-MEDROL) injection  40 mg Intravenous Q24H   nystatin  5 mL Oral QID   PARoxetine  40 mg Oral Daily   polyethylene glycol  17 g Oral Daily   revefenacin  175 mcg Nebulization Daily   rosuvastatin  20 mg Oral Daily   sodium chloride flush  3 mL Intravenous Q12H   spironolactone  12.5 mg Oral Daily   Continuous Infusions:   LOS: 5 days    Time spent:   Zannie Cove, MD [facility] 09/16/2022, 11:40 AM

## 2022-09-16 NOTE — Progress Notes (Signed)
Physical Therapy Treatment Patient Details Name: CHESNEE BOCHE MRN: [id number] DOB: 05-Jun-1955 Today's Date: 09/16/2022   History of Present Illness Pt is 67 year old presented to [facility] on  09/10/22 for acute on chronic respiratory failure with hypoxia due to copd exacerbation. Pt also with acute on chronic heart failrue and suspected recurrent Takostubo cardiomyopathy and NSTEMI. PMH - copd, chronic resp failure on 2-3L O2,  cad, htn, chf, Takostubo cardiomyopathy, SAH, orthostatic hypotension, anxiety, depression    PT Comments    Pt with improved activity tolerance this session, ambulating for increased distances and tolerating multiple bouts of ambulation and transfers. Pt is often tremulous when mobilizing at this time, and will benefit from frequent mobilization in an effort to improve strength and endurance. PT will continue to follow during admission.   Recommendations for follow up therapy are one component of a multi-disciplinary discharge planning process, led by the attending physician.  Recommendations may be updated based on patient status, additional functional criteria and insurance authorization.  Follow Up Recommendations       Assistance Recommended at Discharge Frequent or constant Supervision/Assistance  Patient can return home with the following Help with stairs or ramp for entrance;Assist for transportation;A little help with bathing/dressing/bathroom;Assistance with cooking/housework;A little help with walking and/or transfers   Equipment Recommendations  None recommended by PT    Recommendations for Other Services       Precautions / Restrictions Precautions Precautions: Fall;Other (comment) Precaution Comments: Pt with history of syncope from orthostatic hypotension Restrictions Weight Bearing Restrictions: No     Mobility  Bed Mobility Overal bed mobility: Modified Independent Bed Mobility: Supine to Sit     Supine to sit: Modified independent  (Device/Increase time), HOB elevated          Transfers Overall transfer level: Needs assistance Equipment used: Rollator (4 wheels) Transfers: Sit to/from Stand Sit to Stand: Supervision           General transfer comment: pt stands 5 times during session, with 3 consecutive STS at bedside    Ambulation/Gait Ambulation/Gait assistance: Min guard Gait Distance (Feet): 20 Feet (20; x 2, additional trial of 15') Assistive device: Rolling walker (2 wheels) Gait Pattern/deviations: Step-through pattern Gait velocity: reduced Gait velocity interpretation: <1.31 ft/sec, indicative of household ambulator   General Gait Details: slowed step-through gait, pt often tremulous   Stairs             Wheelchair Mobility    Modified Rankin (Stroke Patients Only)       Balance Overall balance assessment: Needs assistance Sitting-balance support: No upper extremity supported, Feet supported Sitting balance-Leahy Scale: Good     Standing balance support: Bilateral upper extremity supported, Reliant on assistive device for balance Standing balance-Leahy Scale: Poor                              Cognition Arousal/Alertness: Awake/alert Behavior During Therapy: Anxious Overall Cognitive Status: Within Functional Limits for tasks assessed                                          Exercises      General Comments General comments (skin integrity, edema, etc.): pt on 2L[place] at rest, 3L [place] with mobility, sats stable when ambulating, high 90s.      Pertinent Vitals/Pain Pain Assessment Pain Assessment: No/denies pain  Home Living                          Prior Function            PT Goals (current goals can now be found in the care plan section) Acute Rehab PT Goals Patient Stated Goal: return home Progress towards PT goals: Progressing toward goals    Frequency    Min 1X/week      PT Plan Current plan remains  appropriate    Co-evaluation              AM-PAC PT "6 Clicks" Mobility   Outcome Measure  Help needed turning from your back to your side while in a flat bed without using bedrails?: None Help needed moving from lying on your back to sitting on the side of a flat bed without using bedrails?: None Help needed moving to and from a bed to a chair (including a wheelchair)?: A Little Help needed standing up from a chair using your arms (e.g., wheelchair or bedside chair)?: A Little Help needed to walk in hospital room?: A Little Help needed climbing 3-5 steps with a railing? : Total 6 Click Score: 18    End of Session Equipment Utilized During Treatment: Oxygen Activity Tolerance: Patient tolerated treatment well Patient left: in bed;with call bell/phone within reach;with family/visitor present Nurse Communication: Mobility status PT Visit Diagnosis: Other abnormalities of gait and mobility (R26.89);Muscle weakness (generalized) (M62.81)     Time: 1610-9604 PT Time Calculation (min) (ACUTE ONLY): 23 min  Charges:  $Gait Training: 8-22 mins $Therapeutic Activity: 8-22 mins                     Arlyss Gandy, PT, DPT Acute Rehabilitation Office [phone]    Arlyss Gandy 09/16/2022, 5:45 PM

## 2022-09-16 NOTE — Progress Notes (Signed)
   NAME:  Kim [name], MRN:  [id number], DOB:  10/31/1955, LOS: 5 ADMISSION DATE:  09/10/2022, CONSULTATION DATE:  09/16/2022  REFERRING MD:  Trevor Mace, CHIEF COMPLAINT: Respiratory distress  History of Present Illness:  Kim [name] is a 67 year old ex-smoker with severe COPD, chronic respiratory failure on 2 L oxygen and NIV who is known to me for several years.  We have tried Dupixent injections in the past because her exacerbations have explosive onset suggestive of asthma but she had a reactions.  I have given her prednisone prescription for self administration.  Her last exacerbation was about a year ago. She reports that she is struggling with this flare for a few days.  Husband reports exposure to welding fumes when he was working on something in the house, and smoke when a neighbor was burning something.  She went to urgent care, chest x-ray did not show any infiltrates and she was given a Z-Pak.  She started self administering prednisone but her breathing got worse and she had to call EMS. She was placed on BiPAP, labs showed troponin of 67 then 700, leukocytosis  Pertinent  Medical History  Takatsubo cardiomyopathy 2015, 2017 and 2022,  osteoporosis ,vertebral fractures   Significant Hospital Events: Including procedures, antibiotic start and stop dates in addition to other pertinent events   6/6 echo-EF 35 to 40%, septum apex and anterior wall hypokinetic  Interim History / Subjective:  Patient states breathing is slowly improving but not quite to baseline On 2L [place] Got sob yesterday when working with PT   Objective   Blood pressure 113/63, pulse (!) 58, temperature 98.3 F (36.8 C), temperature source Oral, resp. rate 18, height 5' 1.5" (1.562 m), weight 65 kg, SpO2 98 %.        Intake/Output Summary (Last 24 hours) at 09/16/2022 0926 Last data filed at 09/16/2022 0914 Gross per 24 hour  Intake 600 ml  Output 1850 ml  Net -1250 ml    Filed Weights   09/14/22 0416  09/15/22 0510 09/16/22 0440  Weight: 62.8 kg 64 kg 65 kg    Examination: General:   NAD HEENT: MM pink/moist; [place] in place Neuro: Aox3; MAE CV: s1s2, RRR, no m/r/g PULM:  dim clear BS bilaterally; [place] 2L GI: soft, bsx4 active  Extremities: warm/dry, no edema  Skin: no rashes or lesions    Labs show normal electrolytes, troponin trended down, peak 1169, mild leukocytosis Resolved Hospital Problem list     Assessment & Plan:  Acute on chronic hypercarbic respiratory failure COPD exacerbation -On Symbicort and duoneb at home due to affordability Plan: -wean [place] for sats >88%; on 2L [place] at baseline -continue bipap qhs -continue Solu-Medrol daily; can likely soon transition to prednisone 40 daily w/ slow taper -continue brovana/pulmicort/yupelri nebs; cont scheduled abuterol nebs w/ prn as needed -completed 5 day course of Levaquin yesterday -pulm toiletry: IS/flutter -pt/ot -lasix per primary -will need to f/u w/ pulmonary outpt   Best Practice (right click and "Reselect all SmartList Selections" daily)  Per primary   JD Anselm Lis [facility] 09/16/2022, 9:46 AM  Please see [email or web address] for pager details.  From 7A-7P if no response, please call [phone]. After hours, please call ELink [phone].

## 2022-09-16 NOTE — Care Management Important Message (Signed)
Important Message  Patient Details  Name: Kim [name] MRN: [id number] Date of Birth: 1956-02-21   Medicare Important Message Given:  Yes     Sherilyn Banker 09/16/2022, 4:26 PM

## 2022-09-17 DIAGNOSIS — J9621 Acute and chronic respiratory failure with hypoxia: Secondary | ICD-10-CM | POA: Diagnosis not present

## 2022-09-17 DIAGNOSIS — J9622 Acute and chronic respiratory failure with hypercapnia: Secondary | ICD-10-CM | POA: Diagnosis not present

## 2022-09-17 LAB — BASIC METABOLIC PANEL
Anion gap: 9 (ref 5–15)
BUN: 35 mg/dL — ABNORMAL HIGH (ref 8–23)
CO2: 30 mmol/L (ref 22–32)
Calcium: 10.4 mg/dL — ABNORMAL HIGH (ref 8.9–10.3)
Chloride: 98 mmol/L (ref 98–111)
Creatinine, Ser: 1.35 mg/dL — ABNORMAL HIGH (ref 0.44–1.00)
GFR, Estimated: 43 mL/min — ABNORMAL LOW (ref >60)
Glucose, Bld: 171 mg/dL — ABNORMAL HIGH (ref 70–99)
Potassium: 4.4 mmol/L (ref 3.5–5.1)
Sodium: 137 mmol/L (ref 135–145)

## 2022-09-17 LAB — CBC
HCT: 40.3 % (ref 36.0–46.0)
Hemoglobin: 12.8 g/dL (ref 12.0–15.0)
MCH: 30.9 pg (ref 26.0–34.0)
MCHC: 31.8 g/dL (ref 30.0–36.0)
MCV: 97.3 fL (ref 80.0–100.0)
Platelets: 309 10*3/uL (ref 150–400)
RBC: 4.14 MIL/uL (ref 3.87–5.11)
RDW: 13.9 % (ref 11.5–15.5)
WBC: 19.1 10*3/uL — ABNORMAL HIGH (ref 4.0–10.5)
nRBC: 0 % (ref 0.0–0.2)

## 2022-09-17 MED ORDER — PREDNISONE 20 MG PO TABS
40.0000 mg | ORAL_TABLET | Freq: Every day | ORAL | Status: DC
  Administered 2022-09-17 – 2022-09-18 (×2): 40 mg via ORAL
  Filled 2022-09-17 (×2): qty 2

## 2022-09-17 NOTE — Progress Notes (Signed)
   Heart Failure Stewardship Pharmacist Progress Note   PCP: Shirline Frees, NP PCP-Cardiologist: Verne Carrow, MD   HPI:  [name] with PMH of CHF with recovered EF, COPD on chronic O2, HTN, CAD, SAH, and orthostatic hypotension.  She presented to the ED on 6/5 with shortness of breath and as code STEMI. Per MD, STEMI cancelled. She was seen in urgent care 2 days prior and was given a Z-pack. In the ED, was tachycardic, CXR with hyperinflation but no acute abnormality, BNP elevated, troponin elevated. Cardiology and pulmonary have been consulted. Prior ECHO in 09/2021 with EF 60-65% (improved from 35-40% in 08/2021 due to stress cardiomyopathy). ECHO 6/6 showed LVEF 35-40%, no regional wall motion abnormalities, G1DD, RV normal. Findings most likely consistent with recurrent Takotsubo pattern.   Current HF Medications: Beta Blocker: bisoprolol 2.5 mg daily MRA: spironolactone 12.5 mg daily SGLT2i: Farxiga 10 mg daily  Prior to admission HF Medications: Diuretic: furosemide 40 mg daily Beta blocker: bisoprolol 2.5 mg daily PRN for breakthrough chest pain  Pertinent Lab Values: Serum creatinine 1.35, BUN 35, Potassium 4.4, Sodium 137, BNP 236.4, Magnesium 3.3, A1c 6.4   Vital Signs: Weight: 140 lbs (admission weight 145 lbs) Blood pressure: 120/60s  Heart rate: 60s  I/O: -3L yesterday; net -5.7L  Medication Assistance / Insurance Benefits Check: Does the patient have prescription insurance?  Yes Type of insurance plan: BCBS Medicare  Does the patient qualify for medication assistance through manufacturers or grants?   Yes Eligible grants and/or patient assistance programs: Farxiga Medication assistance applications in progress: Farxiga  Medication assistance applications approved: none Approved medication assistance renewals will be completed by: [facility]  Outpatient Pharmacy:  Prior to admission outpatient pharmacy: [facility] Is the patient willing to use [facility] pharmacy  at discharge? Yes Is the patient willing to transition their outpatient pharmacy to utilize a [facility] outpatient pharmacy?   No   Assessment: 1. Acute on chronic systolic CHF (LVEF 35-40%), due to recurrent Takotsubo cardiomyopathy. NYHA class II symptoms. - S/p 1 dose of IV lasix yesterday, good output, weight down 3 lbs. Strict I/Os and daily weights. Keep K>4 and Mg>2. - Continue bisoprolol 2.5 mg daily  - BP too soft for ARB/ARNI. Has a history of orthostatic hypotension and falls. - Continue spironolactone 12.5 mg daily - Continue Farxiga 10 mg daily  Plan: 1) Medication changes recommended at this time: - Continue current regimen   2) Patient assistance: - Has $545 left on deductible - Initial copay for Farxiga $202 - patient assistance application started, pending provider signature and submission  3)  Education  - Patient has been educated on current HF medications and potential additions to HF medication regimen - Patient verbalizes understanding that over the next few months, these medication doses may change and more medications may be added to optimize HF regimen - Patient has been educated on basic disease state pathophysiology and goals of therapy   Sharen Hones, PharmD, BCPS Heart Failure Stewardship Pharmacist Phone [phone]

## 2022-09-17 NOTE — Progress Notes (Signed)
PROGRESS NOTE    Kim [name]  AVW:[id number] DOB: [birth year] DOA: 09/10/2022 PCP: Shirline Frees, NP     Brief Narrative:  66/F with severe COPD, chronic respiratory failure on 2 L home O2 and NIV, diastolic CHF, history of Takotsubo stress-induced cardiomyopathy, presented with shortness of breath.  -Patient presented to the ED placed on nonrebreather, given bronchodilators steroids and followed by BiPAP on admission -Lab-creatinine of 1.6, WBC count of 26.6, troponin 67 which increased to 700s.  Cardiology /pulmonology consulted and following and currently being managed for severe COPD exacerbation  -Echo noted drop in EF to 35%, cards following, recommended medical management, suspected to be Takotsubo's again  New events last 24 hours / Subjective: -Breathing better today, diuresed 3 L yesterday  Assessment & Plan:   Acute on chronic respiratory failure with hypoxia and hypercapnia (HCC) COPD exacerbation -Very slow clinical improvement, continue IV steroids again today, completed 5 days of Levaquin, now off -Wean O2 slowly as tolerated -Continue DuoNebs, budesonide/Brovana and Yupelri -Transition to prednisone 40 Mg daily with slow taper, will need close pulmonary follow-up -Continue BiPAP nightly -Discharge planning, home tomorrow if stable   Chronic systolic and diastolic CHF -History of Takotsubo cardiomyopathy in 2017 and 2022 in the setting of COPD exacerbations, cath during both admissions without significant CAD, LVEF recovered and was normal in 6/23 -Now back with COPD exacerbation, echo with EF down to 35% and recurrent apical WMA -Cards consulted, plan for medical management, continue Farxiga Aldactone and bisoprolol -GDMT limited by hypotension -Improved with diuresis, she is 5.7 L negative, transition to oral Lasix -TOC clinic follow-up arranged  NSTEMI -Troponin peaked at 1169, felt to be secondary to recurrent decreased Takotsubo, see discussion above  minimal CAD on cath 2022 -Cards recommended medical management, continue aspirin Crestor and low-dose bisoprolol  History of VT -Continue bisoprolol     Dyslipidemia  Crestor 20 mg daily  Zetia 10 mg daily    Anxiety and depression  Paxil 40 mg daily  Xanax as needed, try to limit use    Coronary artery calcification seen on CAT scan  Crestor 20 mg daily  Aspirin     AKI (acute kidney injury) (HCC)  Monitor BMP, though currently resolved  DVT prophylaxis: Currently declining Lovenox, SCDs Code Status: DNR Family Communication: Husband bedside Disposition Plan: Home likely tomorrow  Consultants:  Cardiology  Antimicrobials:  Anti-infectives (From admission, onward)    Start     Dose/Rate Route Frequency Ordered Stop   09/11/22 1000  levofloxacin (LEVAQUIN) tablet 750 mg  Status:  Discontinued        750 mg Oral Every 48 hours 09/11/22 0802 09/15/22 1150        Objective: Vitals:   09/17/22 0748 09/17/22 0751 09/17/22 0754 09/17/22 1005  BP:    119/60  Pulse:    64  Resp:      Temp:    98 F (36.7 C)  TempSrc:    Oral  SpO2: 97% 97% 97% 98%  Weight:      Height:        Intake/Output Summary (Last 24 hours) at 09/17/2022 1029 Last data filed at 09/17/2022 0427 Gross per 24 hour  Intake 236 ml  Output 2450 ml  Net -2214 ml   Filed Weights   09/15/22 0510 09/16/22 0440 09/17/22 0419  Weight: 64 kg 65 kg 63.7 kg    Examination:   General exam: Frail chronically ill female sitting up in bed, AAOx3, less tachypneic today, HEENT:  No JVD CVS: S1-S2, regular rhythm Lungs: Improving air movement, no expiratory wheezes today  Abdomen: Soft, nontender, bowel sounds present Extremities: No edema  Data Reviewed: I have personally reviewed following labs and imaging studies  CBC: Recent Labs  Lab 09/10/22 1101 09/10/22 1106 09/13/22 0107 09/14/22 0119 09/15/22 0104 09/16/22 0056 09/17/22 0535  WBC 26.6*   < > 16.9* 15.4* 15.9* 16.5* 19.1*   NEUTROABS 14.4*  --   --   --   --   --   --   HGB 13.0   < > 11.2* 11.2* 11.3* 11.7* 12.8  HCT 41.8   < > 35.1* 35.9* 35.6* 37.5 40.3  MCV 103.5*   < > 97.8 100.6* 98.9 98.4 97.3  PLT 301   < > 247 261 248 267 309   < > = values in this interval not displayed.   Basic Metabolic Panel: Recent Labs  Lab 09/10/22 1321 09/11/22 0147 09/14/22 0119 09/14/22 0941 09/15/22 0104 09/16/22 0056 09/17/22 0535  NA  --    < > 140 141 139 136 137  K  --    < > 5.5* 4.6 4.9 4.5 4.4  CL  --    < > 105 107 103 100 98  CO2  --    < > 29 26 26 26 30   GLUCOSE  --    < > 117* 124* 129* 126* 171*  BUN  --    < > 32* 28* 29* 29* 35*  CREATININE  --    < > 1.23* 1.08* 1.09* 1.25* 1.35*  CALCIUM  --    < > 9.8 9.6 9.9 10.3 10.4*  MG 3.3*  --   --   --   --   --   --    < > = values in this interval not displayed.   GFR: Estimated Creatinine Clearance: 34.6 mL/min (A) (by C-G formula based on SCr of 1.35 mg/dL (H)). Liver Function Tests: Recent Labs  Lab 09/10/22 1101 09/11/22 0147  AST 41 41  ALT 27 28  ALKPHOS 66 55  BILITOT 0.6 0.4  PROT 6.1* 5.3*  ALBUMIN 3.6 3.0*   BNP (last 3 results) Recent Labs    10/04/21 1436  PROBNP 76.0    Recent Results (from the past 240 hour(s))  Respiratory (~20 pathogens) panel by PCR     Status: None   Collection Time: 09/10/22 12:41 PM   Specimen: Nasopharyngeal Swab; Respiratory  Result Value Ref Range Status   Adenovirus NOT DETECTED NOT DETECTED Final   Coronavirus 229E NOT DETECTED NOT DETECTED Final    Comment: (NOTE) The Coronavirus on the Respiratory Panel, DOES NOT test for the novel  Coronavirus (2019 nCoV)    Coronavirus HKU1 NOT DETECTED NOT DETECTED Final   Coronavirus NL63 NOT DETECTED NOT DETECTED Final   Coronavirus OC43 NOT DETECTED NOT DETECTED Final   Metapneumovirus NOT DETECTED NOT DETECTED Final   Rhinovirus / Enterovirus NOT DETECTED NOT DETECTED Final   Influenza A NOT DETECTED NOT DETECTED Final   Influenza B NOT  DETECTED NOT DETECTED Final   Parainfluenza Virus 1 NOT DETECTED NOT DETECTED Final   Parainfluenza Virus 2 NOT DETECTED NOT DETECTED Final   Parainfluenza Virus 3 NOT DETECTED NOT DETECTED Final   Parainfluenza Virus 4 NOT DETECTED NOT DETECTED Final   Respiratory Syncytial Virus NOT DETECTED NOT DETECTED Final   Bordetella pertussis NOT DETECTED NOT DETECTED Final   Bordetella Parapertussis NOT DETECTED NOT DETECTED Final  Chlamydophila pneumoniae NOT DETECTED NOT DETECTED Final   Mycoplasma pneumoniae NOT DETECTED NOT DETECTED Final    Comment: Performed at [facility], [address] [place], [place] [address]  Resp Panel by RT-PCR (Flu A&B, Covid)     Status: None   Collection Time: 09/10/22 12:42 PM  Result Value Ref Range Status   SARS Coronavirus 2 by RT PCR NEGATIVE NEGATIVE Final   Influenza A by PCR NEGATIVE NEGATIVE Final   Influenza B by PCR NEGATIVE NEGATIVE Final    Comment: (NOTE) The Xpert Xpress SARS-CoV-2/FLU/RSV plus assay is intended as an aid in the diagnosis of influenza from Nasopharyngeal swab specimens and should not be used as a sole basis for treatment. Nasal washings and aspirates are unacceptable for Xpert Xpress SARS-CoV-2/FLU/RSV testing.  Fact Sheet for Patients: BloggerCourse.com  Fact Sheet for Healthcare Providers: SeriousBroker.it  This test is not yet approved or cleared by the [place] FDA and has been authorized for detection and/or diagnosis of SARS-CoV-2 by FDA under an Emergency Use Authorization (EUA). This EUA will remain in effect (meaning this test can be used) for the duration of the COVID-19 declaration under Section 564(b)(1) of the Act, 21 U.S.C. section 360bbb-3(b)(1), unless the authorization is terminated or revoked.  Performed at [facility], [address], [place], [place] [address]       Radiology Studies: No results found.   Scheduled Meds:   albuterol  2.5 mg Nebulization Q6H   albuterol  15 mg/hr Nebulization Once   arformoterol  15 mcg Nebulization BID   aspirin EC  81 mg Oral Daily   bisoprolol  2.5 mg Oral Daily   budesonide (PULMICORT) nebulizer solution  0.5 mg Nebulization BID   dapagliflozin propanediol  10 mg Oral Daily   enoxaparin (LOVENOX) injection  40 mg Subcutaneous Q24H   ezetimibe  10 mg Oral Daily   guaiFENesin  600 mg Oral BID   nystatin  5 mL Oral QID   PARoxetine  40 mg Oral Daily   polyethylene glycol  17 g Oral Daily   predniSONE  40 mg Oral Q breakfast   revefenacin  175 mcg Nebulization Daily   rosuvastatin  20 mg Oral Daily   sodium chloride flush  3 mL Intravenous Q12H   spironolactone  12.5 mg Oral Daily   Continuous Infusions:   LOS: 6 days    Time spent:   Zannie Cove, MD [facility] 09/17/2022, 10:29 AM

## 2022-09-17 NOTE — Consult Note (Signed)
   [facility] CM Inpatient Consult   09/17/2022  AILED DEFIBAUGH [birth year] [id number]  [facility] [[facility]]  Accountable Care Organization [ACO] Patient: Medicare ACO REACH  Primary Care Provider:  Shirline Frees, NP with [place] at [facility] which is listed to provide   Patient screened for hospitalization with noted high risk score for unplanned readmission risk 7 day length of stay and  to asess for potential [facility]  [[facility]] Care Management service needs for post hospital transition for care coordination.  Review of patient's electronic medical record reveals patient is admitted for acute on chronic respiratory failure.  Patient has a history of HF noted as well. Went by patient room while on unit rounds and patient was sound asleep, respirations unlabored on rounds, did not awake.   Plan:  Continue to follow progress and disposition to assess for post hospital community care coordination/management needs.  Referral request for community care coordination: to be determine with progress and needs.  Of note, [facility] Care Management/Population Health does not replace or interfere with any arrangements made by the Inpatient Transition of Care team.  For questions contact:   Charlesetta Shanks, RN BSN CCM [facility]  [phone] business mobile phone Toll free office [phone]  *Concierge Line  [phone] Fax number: [phone] [email or web address]    .

## 2022-09-18 ENCOUNTER — Other Ambulatory Visit (HOSPITAL_COMMUNITY): Payer: Self-pay

## 2022-09-18 DIAGNOSIS — I5023 Acute on chronic systolic (congestive) heart failure: Secondary | ICD-10-CM | POA: Diagnosis not present

## 2022-09-18 DIAGNOSIS — J9622 Acute and chronic respiratory failure with hypercapnia: Secondary | ICD-10-CM | POA: Diagnosis not present

## 2022-09-18 DIAGNOSIS — I10 Essential (primary) hypertension: Secondary | ICD-10-CM | POA: Diagnosis not present

## 2022-09-18 DIAGNOSIS — N179 Acute kidney failure, unspecified: Secondary | ICD-10-CM | POA: Diagnosis not present

## 2022-09-18 DIAGNOSIS — J441 Chronic obstructive pulmonary disease with (acute) exacerbation: Secondary | ICD-10-CM | POA: Diagnosis not present

## 2022-09-18 DIAGNOSIS — J9621 Acute and chronic respiratory failure with hypoxia: Secondary | ICD-10-CM | POA: Diagnosis not present

## 2022-09-18 DIAGNOSIS — I5032 Chronic diastolic (congestive) heart failure: Secondary | ICD-10-CM

## 2022-09-18 LAB — CBC
HCT: 36.4 % (ref 36.0–46.0)
Hemoglobin: 11.5 g/dL — ABNORMAL LOW (ref 12.0–15.0)
MCH: 30.8 pg (ref 26.0–34.0)
MCHC: 31.6 g/dL (ref 30.0–36.0)
MCV: 97.6 fL (ref 80.0–100.0)
Platelets: 279 10*3/uL (ref 150–400)
RBC: 3.73 MIL/uL — ABNORMAL LOW (ref 3.87–5.11)
RDW: 13.8 % (ref 11.5–15.5)
WBC: 17.7 10*3/uL — ABNORMAL HIGH (ref 4.0–10.5)
nRBC: 0 % (ref 0.0–0.2)

## 2022-09-18 LAB — BASIC METABOLIC PANEL
Anion gap: 8 (ref 5–15)
BUN: 34 mg/dL — ABNORMAL HIGH (ref 8–23)
CO2: 28 mmol/L (ref 22–32)
Calcium: 9.8 mg/dL (ref 8.9–10.3)
Chloride: 102 mmol/L (ref 98–111)
Creatinine, Ser: 1.07 mg/dL — ABNORMAL HIGH (ref 0.44–1.00)
GFR, Estimated: 57 mL/min — ABNORMAL LOW (ref >60)
Glucose, Bld: 123 mg/dL — ABNORMAL HIGH (ref 70–99)
Potassium: 4 mmol/L (ref 3.5–5.1)
Sodium: 138 mmol/L (ref 135–145)

## 2022-09-18 MED ORDER — SPIRONOLACTONE 25 MG PO TABS
12.5000 mg | ORAL_TABLET | Freq: Every day | ORAL | 0 refills | Status: DC
  Filled 2022-09-18: qty 15, 30d supply, fill #0

## 2022-09-18 MED ORDER — PREDNISONE 10 MG PO TABS
ORAL_TABLET | ORAL | 0 refills | Status: DC
  Filled 2022-09-18: qty 30, 12d supply, fill #0
  Filled 2022-09-18: qty 21, 30d supply, fill #0

## 2022-09-18 MED ORDER — DAPAGLIFLOZIN PROPANEDIOL 10 MG PO TABS
10.0000 mg | ORAL_TABLET | Freq: Every day | ORAL | 0 refills | Status: DC
  Filled 2022-09-18: qty 30, 30d supply, fill #0

## 2022-09-18 NOTE — Assessment & Plan Note (Signed)
Continue blood pressure monitoring.  Added spironolactone to her medical regimen.

## 2022-09-18 NOTE — Hospital Course (Signed)
Mrs. Polio was admitted to the hospital with the working diagnosis of COPD exacerbation, complicated with respiratory failure.   67 yo female with the past medical history of coronary artery disease, hypertension, dyslipidemia, COPD, heart failure, subarachnoid hemorrhage, and depression who presented with dyspnea. Reported 4 days of worsening dyspnea, with increased sputum production, and cough. Symptoms were persistent despite outpatient treatment with prednisone and azithromycin. On the day of hospitalization she developed respiratory distress, EMS was called. She was placed on on rebreather oxygen and and was transported to the ED. On her initial physical examination her blood pressure was 112/80, HR 102, RR 24 and 02 saturation 100% on Bipap, lungs with decreased breath sounds, and bilateral wheezing, increased work of breathing, heart with S1 and S2 present and rhythmic, abdomen with no distention and no lower extremity edema.   VBG 7,29/ 78.3/ 58/ 40/ 85%. Na 137, K 3,3 Cl 92, bicarbonate 31, glucose 353, bun 33 cr 1,62  BNP 236  High sensitive troponin 67  Wbc 26.6 hgb 13.0 plt 301  Sars covid 19 negative Respiratory panel negative   Chest radiograph with hyperinflation with no infiltrates.   EKG 110 bpm, left axis deviation, left bundle branch block, sinus rhythm with no significant ST segment or T wave changes.   Cardiology /pulmonology consulted and following and currently being managed for severe COPD exacerbation  -Echo noted drop in EF to 35%, cards following, recommended medical management, suspected to be Takotsubo's.   Patient with prolonged hospitalization, slowly improving his symptoms.  06/13 plan for discharge home and follow up as outpatient.

## 2022-09-18 NOTE — Assessment & Plan Note (Addendum)
Acute on chronic hypoxemic and hypercapnic respiratory failure.   Patient was placed on aggressive bronchodilator therapy, inhaled and systemic corticosteroids.  Wean off bipap to nasal cannula with good toleration.   Patient has advanced COPD with limited physical functional capacity. Plan to continue slow taper of prednisone, down to her basal dose. Continue home supplemental 02 per [place]bert and home Bipap.  Follow up with pulmonary as outpatient.   Reactive leukocytosis, with no signs of  bacterial infection. At the time of her discharge her wbc is 17.7

## 2022-09-18 NOTE — Progress Notes (Signed)
   09/17/22 2015  BiPAP/CPAP/SIPAP  Reason BIPAP/CPAP not in use Non-compliant (refused cpap at bedside)

## 2022-09-18 NOTE — TOC Transition Note (Signed)
Transition of Care [facility]) - CM/SW Discharge Note   Patient Details  Name: Kim [name] MRN: [id number] Date of Birth: [birth year]  Transition of [facility]) CM/SW Contact:  Leone Haven, RN Phone Number: 09/18/2022, 4:37 PM   Clinical Narrative:    Patient is for dc today, she declined HHPT, she states she may need a shower chair, states she will get it from [facility].  She has transportation to get home today.          Patient Goals and CMS Choice      Discharge Placement                         Discharge Plan and Services Additional resources added to the After Visit Summary for                                       Social Determinants of Health (SDOH) Interventions SDOH Screenings   Food Insecurity: Patient Declined (09/13/2022)  Housing: Patient Declined (09/13/2022)  Transportation Needs: Patient Declined (09/13/2022)  Utilities: Patient Declined (09/13/2022)  Alcohol Screen: Low Risk  (03/22/2020)  Depression (PHQ2-9): Medium Risk (09/10/2021)  Financial Resource Strain: High Risk (09/10/2021)  Physical Activity: Inactive (03/25/2021)  Social Connections: Moderately Isolated (03/25/2021)  Stress: No Stress Concern Present (09/10/2021)  Tobacco Use: Medium Risk (09/10/2022)     Readmission Risk Interventions    09/20/2021    3:27 PM  Readmission Risk Prevention Plan  Transportation Screening Complete  Medication Review (RN Care Manager) Complete  PCP or Specialist appointment within 3-5 days of discharge Complete  HRI or Home Care Consult Complete  SW Recovery Care/Counseling Consult Complete  Palliative Care Screening Not Applicable  Skilled Nursing Facility Not Applicable

## 2022-09-18 NOTE — Progress Notes (Signed)
Physical Therapy Treatment Patient Details Name: Kim [name] MRN: [id number] DOB: 15-Dec-1955 Today's Date: 09/18/2022   History of Present Illness Pt is 67 year old presented to [facility] on  09/10/22 for acute on chronic respiratory failure with hypoxia due to copd exacerbation. Pt also with acute on chronic heart failrue and suspected recurrent Takostubo cardiomyopathy and NSTEMI. PMH - copd, chronic resp failure on 2-3L O2,  cad, htn, chf, Takostubo cardiomyopathy, SAH, orthostatic hypotension, anxiety, depression    PT Comments    Pt tolerated treatment well today. Pt continues to be very anxious about mobility however was able to ambulate to door and back with Min A 1 person HHA. Pt was rather unsteady and educated that she would benefit from RW going forwards. Pt now agreeable to HHPT upon discharge. PT will continue to follow.   Recommendations for follow up therapy are one component of a multi-disciplinary discharge planning process, led by the attending physician.  Recommendations may be updated based on patient status, additional functional criteria and insurance authorization.  Follow Up Recommendations       Assistance Recommended at Discharge Frequent or constant Supervision/Assistance  Patient can return home with the following Help with stairs or ramp for entrance;Assist for transportation;A little help with bathing/dressing/bathroom;Assistance with cooking/housework;A little help with walking and/or transfers   Equipment Recommendations  None recommended by PT    Recommendations for Other Services       Precautions / Restrictions Precautions Precautions: Fall;Other (comment) Precaution Comments: Pt with history of syncope from orthostatic hypotension Restrictions Weight Bearing Restrictions: No     Mobility  Bed Mobility Overal bed mobility: Modified Independent Bed Mobility: Supine to Sit, Sit to Supine     Supine to sit: Modified independent (Device/Increase  time) Sit to supine: Modified independent (Device/Increase time)        Transfers Overall transfer level: Needs assistance Equipment used: 1 person hand held assist Transfers: Sit to/from Stand Sit to Stand: Min guard           General transfer comment: Pt able to perform 2 sit to stands prior to ambulation.    Ambulation/Gait Ambulation/Gait assistance: Min assist Gait Distance (Feet): 10 Feet Assistive device: 1 person hand held assist Gait Pattern/deviations: Narrow base of support, Decreased stride length, Step-through pattern, Staggering left, Staggering right, Drifts right/left Gait velocity: reduced     General Gait Details: Pt very anxious about mobility requesting a xanax prior to ambulation. Pt rather unsteady reaching for objects with 1 person HHA. Pt educated that she would benefit from RW going forward.   Stairs             Wheelchair Mobility    Modified Rankin (Stroke Patients Only)       Balance Overall balance assessment: Needs assistance Sitting-balance support: No upper extremity supported, Feet supported Sitting balance-Leahy Scale: Good     Standing balance support: Bilateral upper extremity supported, Reliant on assistive device for balance Standing balance-Leahy Scale: Poor Standing balance comment: Pt would benefit from RW. Pt furniture walked with 1 person HHA.                            Cognition Arousal/Alertness: Awake/alert Behavior During Therapy: Anxious Overall Cognitive Status: Within Functional Limits for tasks assessed  General Comments: Pt requesting xanax prior to ambulation        Exercises      General Comments General comments (skin integrity, edema, etc.): VSS on RA      Pertinent Vitals/Pain Pain Assessment Pain Assessment: No/denies pain    Home Living                          Prior Function            PT Goals (current goals  can now be found in the care plan section) Progress towards PT goals: Progressing toward goals    Frequency    Min 1X/week      PT Plan Discharge plan needs to be updated    Co-evaluation              AM-PAC PT "6 Clicks" Mobility   Outcome Measure  Help needed turning from your back to your side while in a flat bed without using bedrails?: None Help needed moving from lying on your back to sitting on the side of a flat bed without using bedrails?: None Help needed moving to and from a bed to a chair (including a wheelchair)?: A Little Help needed standing up from a chair using your arms (e.g., wheelchair or bedside chair)?: A Little Help needed to walk in hospital room?: A Little Help needed climbing 3-5 steps with a railing? : Total 6 Click Score: 18    End of Session Equipment Utilized During Treatment: Oxygen;Gait belt Activity Tolerance: Patient tolerated treatment well Patient left: in bed;with call bell/phone within reach Nurse Communication: Mobility status PT Visit Diagnosis: Other abnormalities of gait and mobility (R26.89);Muscle weakness (generalized) (M62.81)     Time: 1610-9604 PT Time Calculation (min) (ACUTE ONLY): 24 min  Charges:  $Gait Training: 8-22 mins $Therapeutic Activity: 8-22 mins                     Shela Nevin, PT, DPT Acute Rehab Services [phone]    Kim [name] 09/18/2022, 4:09 PM

## 2022-09-18 NOTE — Progress Notes (Addendum)
Pt is home with husband who has been staying overnight at the bedside. Per pt he has gone home to get rest. Also waiting on Pulmonologist has to decide if they will sign off today or not. Pt has clothes and transportation for discharge when ready.

## 2022-09-18 NOTE — Discharge Summary (Addendum)
Physician Discharge Summary   Patient: Kim [name] MRN: [id number] DOB: [birth year]  Admit date:     09/10/2022  Discharge date: 09/18/22  Discharge Physician: York Ram Gardy Montanari   PCP: Shirline Frees, NP   Recommendations at discharge:    Patient will continue taking prednisone taper 40 mg daily for 3 days, then 30 mg daily for 3 days, then 20 mg daily for 3 days, then 10 mg daily for 3 days, then resume her usual dose of prednisone.  Continue supplemental 02 per [place] and home non invasive mechanical ventilation, keep 02 saturation 88% or greater.  Continue bronchodilator therapy and inhaled corticosteroids.  Added spironolactone and SGLAT 2 inh for heart failure with reduced LV systolic function.  Follow up with Pulmonary as scheduled Follow up with Cardiology as scheduled. Follow up with Shirline Frees NP in 7 to 10 days.  Patient declined home PT  Discharge Diagnoses: Principal Problem:   COPD exacerbation (HCC) Active Problems:   Acute on chronic systolic CHF (congestive heart failure) (HCC)   Essential hypertension   Anxiety and depression   AKI (acute kidney injury) (HCC)  Resolved Problems:   COPD with acute exacerbation (HCC)   HLD (hyperlipidemia)   Heart failure with preserved ejection fraction [facility])  Hospital Course: Mrs. Kaur was admitted to the hospital with the working diagnosis of COPD exacerbation, complicated with respiratory failure.   67 yo female with the past medical history of coronary artery disease, hypertension, dyslipidemia, COPD, heart failure, subarachnoid hemorrhage, and depression who presented with dyspnea. Reported 4 days of worsening dyspnea, with increased sputum production, and cough. Symptoms were persistent despite outpatient treatment with prednisone and azithromycin. On the day of hospitalization she developed respiratory distress, EMS was called. She was placed on on rebreather oxygen and and was transported to the ED. On her initial  physical examination her blood pressure was 112/80, HR 102, RR 24 and 02 saturation 100% on Bipap, lungs with decreased breath sounds, and bilateral wheezing, increased work of breathing, heart with S1 and S2 present and rhythmic, abdomen with no distention and no lower extremity edema.   VBG 7,29/ 78.3/ 58/ 40/ 85%. Na 137, K 3,3 Cl 92, bicarbonate 31, glucose 353, bun 33 cr 1,62  BNP 236  High sensitive troponin 67  Wbc 26.6 hgb 13.0 plt 301  Sars covid 19 negative Respiratory panel negative   Chest radiograph with hyperinflation with no infiltrates.   EKG 110 bpm, left axis deviation, left bundle branch block, sinus rhythm with no significant ST segment or T wave changes.   Cardiology /pulmonology consulted and following and currently being managed for severe COPD exacerbation  -Echo noted drop in EF to 35%, cards following, recommended medical management, suspected to be Takotsubo's.   Patient with prolonged hospitalization, slowly improving his symptoms.  06/13 plan for discharge home and follow up as outpatient.   Assessment and Plan: * COPD exacerbation (HCC) Acute on chronic hypoxemic and hypercapnic respiratory failure.   Patient was placed on aggressive bronchodilator therapy, inhaled and systemic corticosteroids.  Wean off bipap to nasal cannula with good toleration.   Patient has advanced COPD with limited physical functional capacity. Plan to continue slow taper of prednisone, down to her basal dose. Continue home supplemental 02 per [place] and home Bipap.  Follow up with pulmonary as outpatient.   Reactive leukocytosis, with no signs of  bacterial infection. At the time of her discharge her wbc is 17.7   Acute on chronic systolic CHF (congestive  heart failure) (HCC) Echocardiogram with septum and apex as well anterior wall with hypokinesis. LV EF 35 to 40%, RV with preserved systolic function, with no significant valvular disease.   Patient had elevated troponin up to  1169, and cardiology was consulted.  NSTEMI was ruled out, elevation of cardiac markers likely due to stress induced cardiomyopathy. Patient had coronary angiography in 2022 with no significant coronary artery disease.  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   Recommendation to continue medical therapy with spironolactone, SGLT 2 inh and bisoprolol.  Patient did not tolerate well ARB or Ace inh in the past due to hypotension.   Continue statin and aspirin.   Patient had history of VT, continue bisoprolol.  Follow up as outpatient.     Essential hypertension Continue blood pressure monitoring.  Added spironolactone to her medical regimen.   AKI (acute kidney injury) (HCC) Renal function has recovered well, aki has resolved.  At the time of her discharge her serum cr was 1,0 with K at 4,0 and serum bicarbonate at 28,. Na 138.  Plan to follow up renal function and electrolyte as outpatient.   Anxiety and depression Continue paxil and alprazolam as needed.          Consultants: pulmonary and cardiology  Procedures performed: none   Disposition: Home Diet recommendation:  Discharge Diet Orders (From admission, onward)     Start     Ordered   09/18/22 0000  Diet - low sodium heart healthy        09/18/22 1622           Cardiac diet DISCHARGE MEDICATION: Allergies as of 09/18/2022       Reactions   Tape Other (See Comments)   Skin tears easily, peels skin off   Daliresp [roflumilast] Other (See Comments)   Skin peeling   Cozaar [losartan] Other (See Comments)   Possible cause of severe hypotension   Dupixent [dupilumab] Diarrhea, Swelling, Cough   Facial swelling Headache   Latex Other (See Comments)   Peels skin off   Morphine And Codeine Other (See Comments)   Hallucinations    Penicillins Hives   Statins Other (See Comments)   Myalgia   Zestril [lisinopril] Cough   Omnicef [cefdinir] Rash         Medication List     STOP taking these medications    azithromycin 250 MG tablet Commonly known as: ZITHROMAX       TAKE these medications    albuterol 108 (90 Base) MCG/ACT inhaler Commonly known as: VENTOLIN HFA INHALE 2 PUFFS INTO THE LUNGS EVERY 4 TO 6 HOURS AS NEEDED FOR SHORTNESS OF BREATH OR WHEEZING What changed: See the new instructions.   ALPRAZolam 0.5 MG tablet Commonly known as: XANAX TAKE 1 TABLET(0.5 MG) BY MOUTH THREE TIMES DAILY AS NEEDED FOR ANXIETY What changed: See the new instructions.   ARTIFICIAL TEARS OP Place 1 drop into both eyes 2 (two) times daily as needed (dryness, irritation).   aspirin EC 81 MG tablet Take 1 tablet (81 mg total) by mouth daily.   bisoprolol 5 MG tablet Commonly known as: ZEBETA Take 0.5 tablets (2.5 mg total) by mouth daily as needed (Take HALF tablet by mouth for break through chest pain as needed). What changed:  when to take this additional instructions   budesonide-formoterol 160-4.5 MCG/ACT inhaler Commonly known as: Symbicort Inhale 2 puffs into the lungs 2 (two) times daily.   butalbital-acetaminophen-caffeine  50-325-40 MG tablet Commonly known as: FIORICET Take 1 tablet by mouth every 4 (four) hours as needed for headache. TAKE 1 TABLET BY MOUTH EVERY 4 HOURS AS NEEDED FOR HEADACHETAKE 1 TABLET BY MOUTH EVERY 4 HOURS AS NEEDED FOR HEADACHE   cetirizine 10 MG tablet Commonly known as: ZYRTEC Take 10 mg by mouth daily.   ezetimibe 10 MG tablet Commonly known as: ZETIA TAKE 1 TABLET(10 MG) BY MOUTH DAILY What changed: See the new instructions.   Farxiga 10 MG Tabs tablet Generic drug: dapagliflozin propanediol Take 1 tablet (10 mg total) by mouth daily. Start taking on: September 19, 2022   furosemide 40 MG tablet Commonly known as: LASIX Take 1 tablet (40 mg total) by mouth daily. What changed:  when to take this reasons to take this   HYDROcodone-acetaminophen 7.5-325 MG tablet Commonly known as:  Norco Take 1 tablet by mouth every 6 (six) hours as needed for moderate pain.   ipratropium-albuterol 0.5-2.5 (3) MG/3ML Soln Commonly known as: DUONEB USE 3 ML VIA NEBULIZER EVERY 6 HOURS AS NEEDED What changed:  how much to take how to take this when to take this reasons to take this additional instructions   magnesium oxide 400 MG tablet Commonly known as: MAG-OX Take 400 mg by mouth daily as needed (constipation).   Mucinex Maximum Strength 1200 MG Tb12 Generic drug: Guaifenesin Take 1,200 mg by mouth every 12 (twelve) hours.   nitroGLYCERIN 0.4 MG SL tablet Commonly known as: NITROSTAT Place 1 tablet (0.4 mg total) under the tongue every 5 (five) minutes as needed for chest pain.   ondansetron 4 MG tablet Commonly known as: Zofran Take 1 tablet (4 mg total) by mouth every 8 (eight) hours as needed for nausea or vomiting.   OXYGEN Inhale 2 L/min into the lungs continuous.   PARoxetine 40 MG tablet Commonly known as: PAXIL TAKE 1 TABLET(40 MG) BY MOUTH EVERY MORNING What changed: See the new instructions.   polyethylene glycol powder 17 GM/SCOOP powder Commonly known as: GLYCOLAX/MIRALAX Take 17 g by mouth daily as needed (constipation).   potassium chloride 10 MEQ tablet Commonly known as: KLOR-CON Take 1 tablet (10 mEq total) by mouth as needed (take with furosemide (lasix)). What changed:  when to take this reasons to take this   predniSONE 10 MG tablet Commonly known as: DELTASONE TAKE 1 TABLET(10 MG) BY MOUTH DAILY What changed: See the new instructions.   predniSONE 5 MG tablet Commonly known as: DELTASONE TAKE 1 TABLET(5 MG) BY MOUTH DAILY WITH BREAKFAST What changed: See the new instructions.   predniSONE 10 MG tablet Commonly known as: DELTASONE Take 4 tablets daily for three days, then take 3 tablets daily for three days, then take 2 tablets daily for three days, then take 1 tablet daily for three days, then continue with usual regimen of  prednisone. Start taking on: September 19, 2022 What changed: You were already taking a medication with the same name, and this prescription was added. Make sure you understand how and when to take each.   rosuvastatin 20 MG tablet Commonly known as: CRESTOR Take 1 tablet (20 mg total) by mouth daily. What changed: when to take this   Spacer/Aero Chamber Mouthpiece Misc 1 Device by Does not apply route as directed.   spironolactone 25 MG tablet Commonly known as: ALDACTONE Take 0.5 tablets (12.5 mg total) by mouth daily. Start taking on: September 19, 2022   VITAMIN B-12 PO Take 1 tablet by mouth daily.  VITAMIN D3 PO Take 5,000 Units by mouth daily.        Follow-up Information     [facility] Follow up on 09/30/2022.   Specialty: Cardiology Why: @3 :00PM Contact information: [address] [id number] mc [address] [place] [address] [phone]        Gaston Islam., NP Follow up on 10/13/2022.   Specialty: Cardiology Why: 3:10PM. Cardiology follow up Contact information: [address] [place] [address] [phone]         Shirline Frees, NP. Go on 09/24/2022.   Specialty: Family Medicine Why: @10 :Azzie Roup information: [address] [place] [address] [phone]                Discharge Exam: Filed Weights   09/16/22 0440 09/17/22 0419 09/18/22 0451  Weight: 65 kg 63.7 kg 63.5 kg   BP (!) 100/51 (BP Location: Right Arm)   Pulse 62   Temp 98.3 F (36.8 C) (Oral)   Resp 19   Ht 5' 1.5" (1.562 m)   Wt 63.5 kg   SpO2 96%   BMI 26.02 kg/m   Patient is feeling better, dyspnea is back to baseline.   Neurology awake and alert ENT with mild pallor Cardiovascular with S1 and S2 present and rhythmic with no gallops or rubs No JVD No lower extremity edema Respiratory with prolonged expiratory phase with no wheezing or rales, positive air trapping    Condition at discharge: stable  The results of significant diagnostics from this hospitalization (including imaging, microbiology, ancillary and laboratory) are listed below for reference.   Imaging Studies: ECHOCARDIOGRAM COMPLETE  Result Date: 09/11/2022    ECHOCARDIOGRAM REPORT   Patient Name:   TAIONNA MYLIN Date of Exam: 09/11/2022 Medical Rec #:  [id number]       Height:       61.5 in Accession #:    [id number]      Weight:       140.0 lb Date of Birth:  1955/05/04      BSA:          1.633 m Patient Age:    66 years        BP:           94/61 mmHg Patient Gender: F               HR:           93 bpm. Exam Location:  Inpatient Procedure: 2D Echo, Color Doppler and Cardiac Doppler Indications:    Elevated Troponin  History:        Patient has prior history of Echocardiogram examinations, most                 recent 09/20/2021. CHF, Previous Myocardial Infarction, COPD,                 Signs/Symptoms:Shortness of Breath, Chest Pain and Syncope; Risk                 Factors:Hypertension and Dyslipidemia.  Sonographer:    Darlys Gales Referring Phys: [id number] Perlie Gold IMPRESSIONS  1. Poor endocardial visualization and no definity makes estimation of EF difficult Septum and apex as well as anterior wall hypokinetic. Left ventricular ejection fraction, by estimation, is 35 to 40%. The left ventricle has moderately decreased function. The left ventricle has no regional wall motion abnormalities. The left ventricular internal cavity size was moderately dilated. Left  ventricular diastolic parameters are consistent with Grade I diastolic dysfunction (impaired relaxation).  2. Right ventricular systolic function is normal. The right ventricular size is normal.  3. The mitral valve is normal in structure. No evidence of mitral valve regurgitation. No evidence of mitral stenosis.  4. The aortic valve was not well visualized. Aortic valve regurgitation is mild. Aortic valve sclerosis is present, with no evidence  of aortic valve stenosis.  5. The inferior vena cava is normal in size with greater than 50% respiratory variability, suggesting right atrial pressure of 3 mmHg. FINDINGS  Left Ventricle: Poor endocardial visualization and no definity makes estimation of EF difficult Septum and apex as well as anterior wall hypokinetic. Left ventricular ejection fraction, by estimation, is 35 to 40%. The left ventricle has moderately decreased function. The left ventricle has no regional wall motion abnormalities. The left ventricular internal cavity size was moderately dilated. There is no left ventricular hypertrophy. Left ventricular diastolic parameters are consistent with Grade I diastolic dysfunction (impaired relaxation). Right Ventricle: The right ventricular size is normal. No increase in right ventricular wall thickness. Right ventricular systolic function is normal. Left Atrium: Left atrial size was normal in size. Right Atrium: Right atrial size was normal in size. Pericardium: There is no evidence of pericardial effusion. Mitral Valve: The mitral valve is normal in structure. No evidence of mitral valve regurgitation. No evidence of mitral valve stenosis. Tricuspid Valve: The tricuspid valve is normal in structure. Tricuspid valve regurgitation is not demonstrated. No evidence of tricuspid stenosis. Aortic Valve: The aortic valve was not well visualized. Aortic valve regurgitation is mild. Aortic regurgitation PHT measures 337 msec. Aortic valve sclerosis is present, with no evidence of aortic valve stenosis. Aortic valve mean gradient measures 5.0 mmHg. Aortic valve peak gradient measures 8.4 mmHg. Aortic valve area, by VTI measures 2.57 cm. Pulmonic Valve: The pulmonic valve was normal in structure. Pulmonic valve regurgitation is not visualized. No evidence of pulmonic stenosis. Aorta: The aortic root is normal in size and structure. Venous: The inferior vena cava is normal in size with greater than 50% respiratory  variability, suggesting right atrial pressure of 3 mmHg. IAS/Shunts: The interatrial septum was not well visualized.  LEFT VENTRICLE PLAX 2D LVIDd:         5.40 cm   Diastology LVIDs:         3.90 cm   LV e' medial:    4.35 cm/s LV PW:         0.80 cm   LV E/e' medial:  18.9 LV IVS:        0.80 cm   LV e' lateral:   8.70 cm/s LVOT diam:     1.80 cm   LV E/e' lateral: 9.4 LV SV:         65 LV SV Index:   40 LVOT Area:     2.54 cm  RIGHT VENTRICLE RV S prime:     11.40 cm/s TAPSE (M-mode): 1.5 cm LEFT ATRIUM             Index        RIGHT ATRIUM          Index LA Vol (A2C):   15.3 ml 9.37 ml/m   RA Area:     7.20 cm LA Vol (A4C):   22.8 ml 13.96 ml/m  RA Volume:   11.70 ml 7.16 ml/m LA Biplane Vol: 20.1 ml 12.31 ml/m  AORTIC VALVE AV Area (Vmax):    2.56 cm  AV Area (Vmean):   2.11 cm AV Area (VTI):     2.57 cm AV Vmax:           145.00 cm/s AV Vmean:          106.000 cm/s AV VTI:            0.253 m AV Peak Grad:      8.4 mmHg AV Mean Grad:      5.0 mmHg LVOT Vmax:         146.00 cm/s LVOT Vmean:        88.100 cm/s LVOT VTI:          0.256 m LVOT/AV VTI ratio: 1.01 AI PHT:            337 msec  AORTA Ao Root diam: 2.40 cm MITRAL VALVE MV Area (PHT): 4.93 cm     SHUNTS MV Decel Time: 154 msec     Systemic VTI:  0.26 m MV E velocity: 82.10 cm/s   Systemic Diam: 1.80 cm MV A velocity: 107.00 cm/s MV E/A ratio:  0.77 Charlton Haws MD Electronically signed by Charlton Haws MD Signature Date/Time: 09/11/2022/3:30:55 PM    Final    DG Chest Port 1 View  Result Date: 09/10/2022 CLINICAL DATA:  Shortness of breath EXAM: PORTABLE CHEST 1 VIEW COMPARISON:  X-ray 09/08/2022 FINDINGS: Hyperinflation. Likely chronic interstitial lung changes. No consolidation, pneumothorax or effusion. No edema. Normal cardiopericardial silhouette with a calcified aorta. Overlapping cardiac leads. IMPRESSION: No acute cardiopulmonary disease. Hyperinflation with chronic changes Electronically Signed   By: Karen Kays M.D.   On: 09/10/2022  11:20   DG Chest 2 View  Result Date: 09/08/2022 CLINICAL DATA:  Cough and shortness of breath. EXAM: CHEST - 2 VIEW COMPARISON:  CT 05/08/2022 FINDINGS: Heart size and mediastinal contours appear normal. Lungs are hyperinflated with chronic interstitial changes of emphysema. No superimposed pleural effusion, interstitial edema or airspace disease. IMPRESSION: 1. No active cardiopulmonary abnormalities. 2. Emphysema. Electronically Signed   By: Signa Kell M.D.   On: 09/08/2022 13:09   Cardiac event monitor  Result Date: 08/20/2022 Sinus rhythm with rare PACs and rare PVCs One 4 beat run of VT No evidence of atrial fibrillation or AV block    Microbiology: Results for orders placed or performed during the hospital encounter of 09/10/22  Respiratory (~20 pathogens) panel by PCR     Status: None   Collection Time: 09/10/22 12:41 PM   Specimen: Nasopharyngeal Swab; Respiratory  Result Value Ref Range Status   Adenovirus NOT DETECTED NOT DETECTED Final   Coronavirus 229E NOT DETECTED NOT DETECTED Final    Comment: (NOTE) The Coronavirus on the Respiratory Panel, DOES NOT test for the novel  Coronavirus (2019 nCoV)    Coronavirus HKU1 NOT DETECTED NOT DETECTED Final   Coronavirus NL63 NOT DETECTED NOT DETECTED Final   Coronavirus OC43 NOT DETECTED NOT DETECTED Final   Metapneumovirus NOT DETECTED NOT DETECTED Final   Rhinovirus / Enterovirus NOT DETECTED NOT DETECTED Final   Influenza A NOT DETECTED NOT DETECTED Final   Influenza B NOT DETECTED NOT DETECTED Final   Parainfluenza Virus 1 NOT DETECTED NOT DETECTED Final   Parainfluenza Virus 2 NOT DETECTED NOT DETECTED Final   Parainfluenza Virus 3 NOT DETECTED NOT DETECTED Final   Parainfluenza Virus 4 NOT DETECTED NOT DETECTED Final   Respiratory Syncytial Virus NOT DETECTED NOT DETECTED Final   Bordetella pertussis NOT DETECTED NOT DETECTED Final   Bordetella Parapertussis NOT DETECTED  NOT DETECTED Final   Chlamydophila pneumoniae  NOT DETECTED NOT DETECTED Final   Mycoplasma pneumoniae NOT DETECTED NOT DETECTED Final    Comment: Performed at [facility], [address], [place], [place] [address]  Resp Panel by RT-PCR (Flu A&B, Covid)     Status: None   Collection Time: 09/10/22 12:42 PM  Result Value Ref Range Status   SARS Coronavirus 2 by RT PCR NEGATIVE NEGATIVE Final   Influenza A by PCR NEGATIVE NEGATIVE Final   Influenza B by PCR NEGATIVE NEGATIVE Final    Comment: (NOTE) The Xpert Xpress SARS-CoV-2/FLU/RSV plus assay is intended as an aid in the diagnosis of influenza from Nasopharyngeal swab specimens and should not be used as a sole basis for treatment. Nasal washings and aspirates are unacceptable for Xpert Xpress SARS-CoV-2/FLU/RSV testing.  Fact Sheet for Patients: BloggerCourse.com  Fact Sheet for Healthcare Providers: SeriousBroker.it  This test is not yet approved or cleared by the [place] FDA and has been authorized for detection and/or diagnosis of SARS-CoV-2 by FDA under an Emergency Use Authorization (EUA). This EUA will remain in effect (meaning this test can be used) for the duration of the COVID-19 declaration under Section 564(b)(1) of the Act, 21 U.S.C. section 360bbb-3(b)(1), unless the authorization is terminated or revoked.  Performed at [facility], [address], [place], [place] [address]     Labs: CBC: Recent Labs  Lab 09/14/22 0119 09/15/22 0104 09/16/22 0056 09/17/22 0535 09/18/22 0026  WBC 15.4* 15.9* 16.5* 19.1* 17.7*  HGB 11.2* 11.3* 11.7* 12.8 11.5*  HCT 35.9* 35.6* 37.5 40.3 36.4  MCV 100.6* 98.9 98.4 97.3 97.6  PLT 261 248 267 309 279   Basic Metabolic Panel: Recent Labs  Lab 09/14/22 0941 09/15/22 0104 09/16/22 0056 09/17/22 0535 09/18/22 0026  NA 141 139 136 137 138  K 4.6 4.9 4.5 4.4 4.0  CL 107 103 100 98 102  CO2 26 26 26 30 28   GLUCOSE 124* 129* 126* 171* 123*  BUN  28* 29* 29* 35* 34*  CREATININE 1.08* 1.09* 1.25* 1.35* 1.07*  CALCIUM 9.6 9.9 10.3 10.4* 9.8   Liver Function Tests: No results for input(s): "AST", "ALT", "ALKPHOS", "BILITOT", "PROT", "ALBUMIN" in the last 168 hours. CBG: No results for input(s): "GLUCAP" in the last 168 hours.  Discharge time spent: greater than 30 minutes.  Signed: Coralie Keens, MD [facility] 09/18/2022

## 2022-09-18 NOTE — Assessment & Plan Note (Signed)
Renal function has recovered well, aki has resolved.  At the time of her discharge her serum cr was 1,0 with K at 4,0 and serum bicarbonate at 28,. Na 138.  Plan to follow up renal function and electrolyte as outpatient.

## 2022-09-18 NOTE — Assessment & Plan Note (Addendum)
Echocardiogram with septum and apex as well anterior wall with hypokinesis. LV EF 35 to 40%, RV with preserved systolic function, with no significant valvular disease.   Patient had elevated troponin up to 1169, and cardiology was consulted.  NSTEMI was ruled out, elevation of cardiac markers likely due to stress induced cardiomyopathy. Patient had coronary angiography in 2022 with no significant coronary artery disease.  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   Recommendation to continue medical therapy with spironolactone, SGLT 2 inh and bisoprolol.  Patient did not tolerate well ARB or Ace inh in the past due to hypotension.   Continue statin and aspirin.   Patient had history of VT, continue bisoprolol.  Follow up as outpatient.

## 2022-09-18 NOTE — Assessment & Plan Note (Signed)
Continue paxil and alprazolam as needed.

## 2022-09-18 NOTE — Progress Notes (Signed)
   Heart Failure Stewardship Pharmacist Progress Note   PCP: Shirline Frees, NP PCP-Cardiologist: Verne Carrow, MD   HPI:  [name] with PMH of CHF with recovered EF, COPD on chronic O2, HTN, CAD, SAH, and orthostatic hypotension.  She presented to the ED on 6/5 with shortness of breath and as code STEMI. Per MD, STEMI cancelled. She was seen in urgent care 2 days prior and was given a Z-pack. In the ED, was tachycardic, CXR with hyperinflation but no acute abnormality, BNP elevated, troponin elevated. Cardiology and pulmonary have been consulted. Prior ECHO in 09/2021 with EF 60-65% (improved from 35-40% in 08/2021 due to stress cardiomyopathy). ECHO 6/6 showed LVEF 35-40%, no regional wall motion abnormalities, G1DD, RV normal. Findings most likely consistent with recurrent Takotsubo pattern.   Current HF Medications: Beta Blocker: bisoprolol 2.5 mg daily MRA: spironolactone 12.5 mg daily SGLT2i: Farxiga 10 mg daily  Prior to admission HF Medications: Diuretic: furosemide 40 mg daily Beta blocker: bisoprolol 2.5 mg daily PRN for breakthrough chest pain  Pertinent Lab Values: Serum creatinine 1.07, BUN 34, Potassium 4.0, Sodium 138, BNP 236.4, Magnesium 3.3, A1c 6.4   Vital Signs: Weight: 139 lbs (admission weight 145 lbs) Blood pressure: 100/60s  Heart rate: 50-60s  I/O: -1L yesterday; net -6.5L  Medication Assistance / Insurance Benefits Check: Does the patient have prescription insurance?  Yes Type of insurance plan: BCBS Medicare  Does the patient qualify for medication assistance through manufacturers or grants?   Yes Eligible grants and/or patient assistance programs: Farxiga Medication assistance applications in progress: Farxiga  Medication assistance applications approved: none Approved medication assistance renewals will be completed by: [facility]  Outpatient Pharmacy:  Prior to admission outpatient pharmacy: [facility] Is the patient willing to use [facility] TOC  pharmacy at discharge? Yes Is the patient willing to transition their outpatient pharmacy to utilize a [facility] outpatient pharmacy?   No   Assessment: 1. Acute on chronic systolic CHF (LVEF 35-40%), due to recurrent Takotsubo cardiomyopathy. NYHA class II symptoms. - Off IV lasix. Consider starting PRN at discharge. Strict I/Os and daily weights. Keep K>4 and Mg>2. - Continue bisoprolol 2.5 mg daily  - BP too soft for ARB/ARNI. Has a history of orthostatic hypotension and falls. - Continue spironolactone 12.5 mg daily - Continue Farxiga 10 mg daily  Plan: 1) Medication changes recommended at this time: - Continue current regimen; add PRN lasix for discharge  2) Patient assistance: - Has $545 left on deductible - Initial copay for Farxiga $202 - patient assistance application started, pending provider signature and submission  3)  Education  - Patient has been educated on current HF medications and potential additions to HF medication regimen - Patient verbalizes understanding that over the next few months, these medication doses may change and more medications may be added to optimize HF regimen - Patient has been educated on basic disease state pathophysiology and goals of therapy   Sharen Hones, PharmD, BCPS Heart Failure Stewardship Pharmacist Phone [phone]

## 2022-09-18 NOTE — Progress Notes (Signed)
   NAME:  Kim [name], MRN:  [id number], DOB:  [birth year], LOS: 7 ADMISSION DATE:  09/10/2022, CONSULTATION DATE:  09/18/2022  REFERRING MD:  Trevor Mace, CHIEF COMPLAINT: Respiratory distress  History of Present Illness:  Kim [name] is a 67 year old ex-smoker with severe COPD, chronic respiratory failure on 2 L oxygen and NIV who is known to me for several years.  We have tried Dupixent injections in the past because her exacerbations have explosive onset suggestive of asthma but she had a reactions.  I have given her prednisone prescription for self administration.  Her last exacerbation was about a year ago. She reports that she is struggling with this flare for a few days.  Husband reports exposure to welding fumes when he was working on something in the house, and smoke when a neighbor was burning something.  She went to urgent care, chest x-ray did not show any infiltrates and she was given a Z-Pak.  She started self administering prednisone but her breathing got worse and she had to call EMS. She was placed on BiPAP, labs showed troponin of 67 then 700, leukocytosis  Pertinent  Medical History  Takatsubo cardiomyopathy 2015, 2017 and 2022,  osteoporosis ,vertebral fractures   Significant Hospital Events: Including procedures, antibiotic start and stop dates in addition to other pertinent events   6/6 echo-EF 35 to 40%, septum apex and anterior wall hypokinetic  Interim History / Subjective:  Reports feeling better wants to go home   Objective   Blood pressure 106/68, pulse 67, temperature 98.1 F (36.7 C), temperature source Oral, resp. rate 18, height 5' 1.5" (1.562 m), weight 63.5 kg, SpO2 99 %.        Intake/Output Summary (Last 24 hours) at 09/18/2022 1031 Last data filed at 09/18/2022 0830 Gross per 24 hour  Intake 603 ml  Output 1150 ml  Net -547 ml   Filed Weights   09/16/22 0440 09/17/22 0419 09/18/22 0451  Weight: 65 kg 63.7 kg 63.5 kg    Examination: General ill  appearing elderly female No JVD or lymphadenopathy is appreciated Decreased chest movement coarse rhonchi bilaterally have gray sputum Heart sounds are distant Abdomen soft nontender Extremities warm without edema  Labs show normal electrolytes, troponin trended down, peak 1169, mild leukocytosis Resolved Hospital Problem list     Assessment & Plan:  Acute on chronic hypercarbic respiratory failure COPD exacerbation -On Symbicort and duoneb at home due to affordability Plan: Continue oxygen as needed keep sats greater than 88%, 2 L of cannula at baseline Nightly BiPAP Continue prednisone wean as tolerated Continue bronchodilators Pulmonary toilet Follow-up with pulmonary as an outpatient Diuresis as tolerated     Best Practice (right click and "Reselect all SmartList Selections" daily)  Per primary   Brett Canales Sloan Takagi ACNP Acute Care Nurse Practitioner [facility]/Critical [facility] Please consult Amion 09/18/2022, 10:31 AM

## 2022-09-19 ENCOUNTER — Telehealth: Payer: Self-pay

## 2022-09-19 NOTE — Transitions of Care (Post Inpatient/ED Visit) (Signed)
   09/19/2022  Name: Kim [name] MRN: [id number] DOB: [birth year]  Today's TOC FU Call Status: Today's TOC FU Call Status:: Unsuccessul Call (1st Attempt) Unsuccessful Call (1st Attempt) Date: 09/19/22  Attempted to reach the patient regarding the most recent Inpatient/ED visit.  Follow Up Plan: Additional outreach attempts will be made to reach the patient to complete the Transitions of Care (Post Inpatient/ED visit) call.     Antionette Fairy, RN,BSN,CCM [facility] Care Management Care Management Community Coordinator Direct Phone: [phone] Toll Free: [phone] Fax: [phone]

## 2022-09-22 ENCOUNTER — Telehealth: Payer: Self-pay

## 2022-09-22 ENCOUNTER — Ambulatory Visit (HOSPITAL_COMMUNITY)
Admission: RE | Admit: 2022-09-22 | Discharge: 2022-09-22 | Disposition: A | Discharge status: Home / Self Care | Payer: Medicare Other | Source: Ambulatory Visit | Attending: Cardiovascular Disease | Admitting: Cardiovascular Disease

## 2022-09-22 DIAGNOSIS — R55 Syncope and collapse: Secondary | ICD-10-CM | POA: Diagnosis present

## 2022-09-22 NOTE — Transitions of Care (Post Inpatient/ED Visit) (Signed)
09/22/2022  Name: Kim [name] MRN: [id number] DOB: 11-21-1955  Today's TOC FU Call Status: Today's TOC FU Call Status:: Successful TOC FU Call Competed TOC FU Call Complete Date: 09/22/22  Transition Care Management Follow-up Telephone Call Date of Discharge: 09/18/22 Discharge Facility: [facility] Type of Discharge: Inpatient Admission Primary Inpatient Discharge Diagnosis:: "acute resp failure with hypoxia" How have you been since you were released from the hospital?: Better (Pt states she is 'doing good." Resp status managed with current regimen. Denies any acute issues or concerns. Sleeping well. Appetite fair.) Any questions or concerns?: No  Items Reviewed: Did you receive and understand the discharge instructions provided?: Yes Medications obtained,verified, and reconciled?: Partial Review Completed Reason for Partial Mediation Review: pt still in bed Any new allergies since your discharge?: No Dietary orders reviewed?: Yes Type of Diet Ordered:: low salt/heart healthy Do you have support at home?: Yes People in Home: spouse Name of Support/Comfort Primary Source: [place] Distance  Medications Reviewed Today: Medications Reviewed Today     Reviewed by Charlyn Minerva, RN (Registered Nurse) on 09/22/22 at [phone]  Med List Status: <None>   Medication Order Taking? Sig Documenting Provider Last Dose Status Informant  albuterol (VENTOLIN HFA) 108 (90 Base) MCG/ACT inhaler [id number] Yes INHALE 2 PUFFS INTO THE LUNGS EVERY 4 TO 6 HOURS AS NEEDED FOR SHORTNESS OF BREATH OR WHEEZING  Patient taking differently: Inhale 2 puffs into the lungs See admin instructions. Inhale 2 puffs every 4-6 hours as needed for wheezing or shortness or breath.   Noemi Chapel, NP Taking Active Self, Pharmacy Records  ALPRAZolam Prudy Feeler) 0.5 MG tablet [id number]  TAKE 1 TABLET(0.5 MG) BY MOUTH THREE TIMES DAILY AS NEEDED FOR ANXIETY  Patient taking differently: Take 0.5 mg by mouth every  6 (six) hours.   Shirline Frees, NP  Active Self, Pharmacy Records  aspirin EC 81 MG EC tablet [id number]  Take 1 tablet (81 mg total) by mouth daily. Rhetta Mura, MD  Active Self, Pharmacy Records           Med Note (COFFELL, Marzella Schlein   Fri Sep 12, 2022  6:14 PM) Pt specifically asked me to keep on chart as QD. Pt is currently taking about 3 days a week.  bisoprolol (ZEBETA) 5 MG tablet [id number]  Take 0.5 tablets (2.5 mg total) by mouth daily as needed (Take HALF tablet by mouth for break through chest pain as needed).  Patient taking differently: Take 2.5 mg by mouth See admin instructions. 2.5 mg once daily. May take an additional 2.5 mg as needed for break through chest pain.   Marinus Maw, MD  Active Self, Pharmacy Records           Med Note (COFFELL, Marzella Schlein   Fri Sep 12, 2022  6:17 PM) Pt unable to verify if she takes this medication in the morning or evening, unable to confirm exact time of last dose.  budesonide-formoterol (SYMBICORT) 160-4.5 MCG/ACT inhaler [id number] Yes Inhale 2 puffs into the lungs 2 (two) times daily. Oretha Milch, MD Taking Active Self, Pharmacy Records  butalbital-acetaminophen-caffeine [facility]) [phone] MG tablet [id number]  Take 1 tablet by mouth every 4 (four) hours as needed for headache. TAKE 1 TABLET BY MOUTH EVERY 4 HOURS AS NEEDED FOR HEADACHETAKE 1 TABLET BY MOUTH EVERY 4 HOURS AS NEEDED FOR HEADACHE Nafziger, Kandee Keen, NP  Active Self, Pharmacy Records  Carboxymethylcellulose Sodium (ARTIFICIAL TEARS OP) [id number]  Place 1 drop into both eyes 2 (two)  times daily as needed (dryness, irritation). [provider]  Active Self, Pharmacy Records  cetirizine (ZYRTEC) 10 MG tablet [id number]  Take 10 mg by mouth daily. [provider]  Active Self, Pharmacy Records  Cholecalciferol (VITAMIN D3 PO) [id number]  Take 5,000 Units by mouth daily. [provider]  Active Self, Pharmacy Records  Cyanocobalamin (VITAMIN B-12 PO) [id number]   Take 1 tablet by mouth daily. [provider]  Active Self, Pharmacy Records  dapagliflozin propanediol (FARXIGA) 10 MG TABS tablet [id number] Yes Take 1 tablet (10 mg total) by mouth daily. Arrien, York Ram, MD Taking Active   ezetimibe (ZETIA) 10 MG tablet [id number]  TAKE 1 TABLET(10 MG) BY MOUTH DAILY  Patient taking differently: Take 10 mg by mouth daily.   Nafziger, Kandee Keen, NP  Active Self, Pharmacy Records  furosemide (LASIX) 40 MG tablet [id number]  Take 1 tablet (40 mg total) by mouth daily.  Patient taking differently: Take 40 mg by mouth daily as needed for fluid.   Kathleene Hazel, MD  Active Self, Pharmacy Records  HYDROcodone-acetaminophen [facility]) 7.5-325 MG tablet [id number]  Take 1 tablet by mouth every 6 (six) hours as needed for moderate pain. Nafziger, Kandee Keen, NP  Active Self, Pharmacy Records  ipratropium-albuterol (DUONEB) 0.5-2.5 (3) MG/3ML Criss Rosales [id number] Yes USE 3 ML VIA NEBULIZER EVERY 6 HOURS AS NEEDED  Patient taking differently: Take 3 mLs by nebulization every 6 (six) hours as needed (wheezing, shortness of breath).   Oretha Milch, MD Taking Active Self, Pharmacy Records           Med Note (COFFELL, Marzella Schlein   Fri Sep 12, 2022  6:21 PM) Pt specifically asked to to not change directions on this medication, as to not mess up her prescriptions. Pt has been taking TID from 6/2-6/4.  magnesium oxide (MAG-OX) 400 MG tablet [id number]  Take 400 mg by mouth daily as needed (constipation). [provider]  Active Self, Pharmacy Records  [facility] MAXIMUM STRENGTH 1200 MG [phone] [id number] Yes Take 1,200 mg by mouth every 12 (twelve) hours. [provider] Taking Active Self, Pharmacy Records  nitroGLYCERIN (NITROSTAT) 0.4 MG SL tablet [id number]  Place 1 tablet (0.4 mg total) under the tongue every 5 (five) minutes as needed for chest pain. Kathleene Hazel, MD  Active Self, Pharmacy Records  ondansetron [facility]) 4 MG tablet [id number]  Take 1  tablet (4 mg total) by mouth every 8 (eight) hours as needed for nausea or vomiting. Shirline Frees, NP  Active Self, Pharmacy Records  OXYGEN [id number]  Inhale 2 L/min into the lungs continuous. [provider]  Active Self, Pharmacy Records  PARoxetine (PAXIL) 40 MG tablet [id number]  TAKE 1 TABLET(40 MG) BY MOUTH EVERY MORNING  Patient taking differently: Take 40 mg by mouth daily.   Nafziger, Kandee Keen, NP  Active Self, Pharmacy Records  polyethylene glycol powder (GLYCOLAX/MIRALAX) 17 GM/SCOOP powder [id number]  Take 17 g by mouth daily as needed (constipation). [provider]  Active Self, Pharmacy Records  potassium chloride (KLOR-CON) 10 MEQ tablet [id number]  Take 1 tablet (10 mEq total) by mouth as needed (take with furosemide (lasix)).  Patient taking differently: Take 10 mEq by mouth daily as needed (with furosemide.).   Kathleene Hazel, MD  Active Self, Pharmacy Records  predniSONE (DELTASONE) 10 MG tablet [id number] Yes TAKE 1 TABLET(10 MG) BY MOUTH DAILY  Patient taking differently: Take 10 mg by mouth daily.   Oretha Milch, MD Taking Active Self, Pharmacy Records  Med Note (COFFELL, Marzella Schlein   Fri Sep 12, 2022  6:27 PM) Pt specifically asked me to not change anything on this RX, as to not mess up her prescriptions. Pt typically takes 10 mg once daily on Sunday, Tuesday, Thursday, Saturdays. Pt states she has been taking 40 mg (4-10mg  tablets) daily from 6/2-6/4 due to exacerbation.  predniSONE (DELTASONE) 10 MG tablet [id number]  Take 4 tablets daily for three days, then take 3 tablets daily for three days, then take 2 tablets daily for three days, then take 1 tablet daily for three days, then continue with usual regimen of prednisone. Arrien, York Ram, MD  Active   predniSONE (DELTASONE) 5 MG tablet [id number] Yes TAKE 1 TABLET(5 MG) BY MOUTH DAILY WITH BREAKFAST  Patient taking differently: Take 5 mg by mouth daily with breakfast.   Oretha Milch,  MD Taking Active Self, Pharmacy Records           Med Note (COFFELL, Marzella Schlein   Fri Sep 12, 2022  6:29 PM) Pt specifically asked me not to change anything on this RX, as to not mess up her prescriptions. Pt typically takes 5 mg every Monday, Wednesday, Friday. Pt has not taken since 5/31.  rosuvastatin (CRESTOR) 20 MG tablet [id number]  Take 1 tablet (20 mg total) by mouth daily.  Patient taking differently: Take 20 mg by mouth every evening.   Kathleene Hazel, MD  Active Self, Pharmacy Records  [facility] Mouthpiece MISC [id number]  1 Device by Does not apply route as directed. Roslynn Amble, MD  Active Self, Pharmacy Records  spironolactone (ALDACTONE) 25 MG tablet [id number] Yes Take 0.5 tablets (12.5 mg total) by mouth daily. Arrien, York Ram, MD Taking Active   Med List Note Margo Aye, Fawn Kirk, CPhT 09/17/21 5366): Also uses mail order            Home Care and Equipment/Supplies: Were Home Health Services Ordered?: NA  Functional Questionnaire: Do you need assistance with bathing/showering or dressing?: No Do you need assistance with meal preparation?: No Do you need assistance with eating?: No Do you have difficulty maintaining continence: No Do you need assistance with getting out of bed/getting out of a chair/moving?: No Do you have difficulty managing or taking your medications?: No  Follow up appointments reviewed: PCP Follow-up appointment confirmed?: Yes Date of PCP follow-up appointment?: 09/24/22 Follow-up Provider: [facility] Follow-up appointment confirmed?: Yes Date of Specialist follow-up appointment?: 10/13/22 Follow-Up Specialty Provider:: Robin Searing Do you need transportation to your follow-up appointment?: No Do you understand care options if your condition(s) worsen?: Yes-patient verbalized understanding  SDOH Interventions Today    Flowsheet Row Most Recent Value  SDOH Interventions   Transportation  Interventions Intervention Not Indicated      TOC Interventions Today    Flowsheet Row Most Recent Value  TOC Interventions   TOC Interventions Discussed/Reviewed TOC Interventions Discussed      Interventions Today    Flowsheet Row Most Recent Value  Chronic Disease   Chronic disease during today's visit Chronic Obstructive Pulmonary Disease (COPD)  General Interventions   General Interventions Discussed/Reviewed General Interventions Discussed, Doctor Visits, Durable Medical Equipment (DME)  Doctor Visits Discussed/Reviewed Doctor Visits Discussed, Specialist, PCP  Durable Medical Equipment (DME) Oxygen, Other  [BiPap-pt states she wears device at night and about 4-6hrs during the day]  PCP/Specialist Visits Compliance with follow-up visit  Education Interventions   Education Provided Provided Education  Provided Verbal Education On Nutrition, When  to see the doctor, Medication  Nutrition Interventions   Nutrition Discussed/Reviewed Nutrition Discussed, Adding fruits and vegetables, Decreasing salt  Pharmacy Interventions   Pharmacy Dicussed/Reviewed Pharmacy Topics Discussed, Medications and their functions  Safety Interventions   Safety Discussed/Reviewed Safety Discussed       Alessandra Grout [facility] Care Management Care Management Community Coordinator Direct Phone: [phone] Toll Free: [phone] Fax: [phone]

## 2022-09-24 ENCOUNTER — Ambulatory Visit (INDEPENDENT_AMBULATORY_CARE_PROVIDER_SITE_OTHER): Payer: Medicare Other | Admitting: Adult Health

## 2022-09-24 ENCOUNTER — Telehealth: Payer: Self-pay

## 2022-09-24 ENCOUNTER — Encounter: Payer: Self-pay | Admitting: Adult Health

## 2022-09-24 VITALS — BP 120/62 | HR 67 | Temp 98.1°F | Ht 61.5 in | Wt 136.0 lb

## 2022-09-24 DIAGNOSIS — J441 Chronic obstructive pulmonary disease with (acute) exacerbation: Secondary | ICD-10-CM | POA: Diagnosis not present

## 2022-09-24 DIAGNOSIS — I5023 Acute on chronic systolic (congestive) heart failure: Secondary | ICD-10-CM | POA: Diagnosis not present

## 2022-09-24 DIAGNOSIS — N179 Acute kidney failure, unspecified: Secondary | ICD-10-CM | POA: Diagnosis not present

## 2022-09-24 DIAGNOSIS — I10 Essential (primary) hypertension: Secondary | ICD-10-CM | POA: Diagnosis not present

## 2022-09-24 DIAGNOSIS — R519 Headache, unspecified: Secondary | ICD-10-CM

## 2022-09-24 DIAGNOSIS — G8929 Other chronic pain: Secondary | ICD-10-CM

## 2022-09-24 DIAGNOSIS — B370 Candidal stomatitis: Secondary | ICD-10-CM

## 2022-09-24 DIAGNOSIS — R2681 Unsteadiness on feet: Secondary | ICD-10-CM

## 2022-09-24 DIAGNOSIS — F419 Anxiety disorder, unspecified: Secondary | ICD-10-CM

## 2022-09-24 DIAGNOSIS — F32A Depression, unspecified: Secondary | ICD-10-CM

## 2022-09-24 LAB — CBC
HCT: 41.8 % (ref 36.0–46.0)
Hemoglobin: 13.4 g/dL (ref 12.0–15.0)
MCHC: 32 g/dL (ref 30.0–36.0)
MCV: 97.2 fl (ref 78.0–100.0)
Platelets: 334 10*3/uL (ref 150.0–400.0)
RBC: 4.3 Mil/uL (ref 3.87–5.11)
RDW: 14.9 % (ref 11.5–15.5)
WBC: 21.7 10*3/uL (ref 4.0–10.5)

## 2022-09-24 LAB — COMPREHENSIVE METABOLIC PANEL
ALT: 48 U/L — ABNORMAL HIGH (ref 0–35)
AST: 31 U/L (ref 0–37)
Albumin: 4.2 g/dL (ref 3.5–5.2)
Alkaline Phosphatase: 52 U/L (ref 39–117)
BUN: 28 mg/dL — ABNORMAL HIGH (ref 6–23)
CO2: 32 mEq/L (ref 19–32)
Calcium: 9.5 mg/dL (ref 8.4–10.5)
Chloride: 100 mEq/L (ref 96–112)
Creatinine, Ser: 1.18 mg/dL (ref 0.40–1.20)
GFR: 47.42 mL/min — ABNORMAL LOW (ref >60.00)
Glucose, Bld: 110 mg/dL — ABNORMAL HIGH (ref 70–99)
Potassium: 3.9 mEq/L (ref 3.5–5.1)
Sodium: 141 mEq/L (ref 135–145)
Total Bilirubin: 0.6 mg/dL (ref 0.2–1.2)
Total Protein: 6.7 g/dL (ref 6.0–8.3)

## 2022-09-24 MED ORDER — BUTALBITAL-APAP-CAFFEINE 50-325-40 MG PO TABS
1.0000 | ORAL_TABLET | ORAL | 3 refills | Status: DC | PRN
Start: 2022-09-24 — End: 2023-11-25

## 2022-09-24 MED ORDER — ALPRAZOLAM 0.5 MG PO TABS
0.5000 mg | ORAL_TABLET | Freq: Four times a day (QID) | ORAL | 2 refills | Status: DC
Start: 2022-09-24 — End: 2022-10-15

## 2022-09-24 MED ORDER — NYSTATIN 100000 UNIT/ML MT SUSP
5.0000 mL | Freq: Four times a day (QID) | OROMUCOSAL | 2 refills | Status: DC
Start: 2022-09-24 — End: 2022-11-04

## 2022-09-24 NOTE — Progress Notes (Signed)
Subjective:    Patient ID: Kim [name], female    DOB: 10/21/1955, 67 y.o.   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  She presents to the office today for TCM visit  Admit Date: 09/10/2022 Discharge Date 09/18/2022  Presented to the hospital with 4 days of worsening dyspnea with increased sputum production and cough.  Her symptoms were persistent despite outpatient treatment with prednisone and azithromycin.  On the day of hospitalization she developed respiratory distress and EMS was called.  She was placed on a rebreather and transported to the emergency room.  On her initial physical exam her blood pressure was 112/80, heart rate 102, RR 24, and O2 saturation 100% on BiPAP.  Lungs with decreased breath sounds and bilateral wheezing with increased work of breathing  Chest x-ray in the ER showed hyperinflation with no infiltrates.  EKG showed 110 bpm, left axis deviation, left bundle branch block, sinus rhythm with no significant ST segment or T wave changes  No noted a drop in EF to 35%, cardiology recommended medical management suspected to be [facility] Course  COPD Exacerbation  -Acute on chronic hypoxic and hypercapnic respiratory failure.  She was placed on aggressive bronchodilator therapy, inhaled and systemic corticosteroids.  She was able to wean off BiPAP to nasal cannula with good toleration.  Discharged home on a slow taper of prednisone, titration down to her basal dose.  Continued on home supplemental oxygen per nasal cannula and home BiPAP.  She was advised to follow-up with pulmonary outpatient -Was noted that she had reactive leukocytosis with no signs of bacterial infection.  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  LVEF 35 to 40%, RV with preserved systolic function with no significant valvular disease. -Had an elevated troponin up to 1169, NSTEMI was ruled out, elevation of cardiac markers likely due to stress-induced cardiomyopathy. -She had a coronary angiographic in 2022 with no significant coronary artery disease. 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 -Aloe up outpatient cardiology  Hypertension  -Added spironolactone to her medication regiment  AKI -Renal function recovered well, AKI has resolved.  At the time of her discharge her serum creatinine is 1.0 with a K at 4.0 and serum bicarbonate at 28.  NA 138.  Anxiety and depression -Continued on Paxil and Xanax as needed  Today she is with her husband.  She reports that she is feeling back to baseline and has been going progressing slowly.  She continues to be on her prednisone taper.  She is back to baseline oxygen requirements.  She has not had any fevers or chills.  She reports that since being discharged from the hospital she has developed thrush and sores in her mouth from steroid therapy.  Prior to going into the emergency room she had a fall which caused a skin tear on her left forearm, this is healed well but she does feel weak and unsteady on her feet.  She would like to do outpatient physical therapy to help gain some mobility.  Review of Systems  Constitutional: Negative.   HENT: Negative.  Eyes: Negative.   Respiratory:  Positive for cough and shortness of breath.   Cardiovascular: Negative.   Gastrointestinal: Negative.   Endocrine: Negative.   Genitourinary: Negative.   Musculoskeletal:  Positive for gait problem.  Skin:  Positive for wound.   Allergic/Immunologic: Negative.   Hematological: Negative.   Psychiatric/Behavioral: Negative.     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   OSTEOARTHRITIS 06/05/2009   PARESTHESIA 08/24/2007   TOBACCO ABUSE 01/25/2010    Social History   Socioeconomic History   Marital status: Married    Spouse name: Thereasa Distance   Number of children: 2   Years of education: Not on file   Highest education level: Not on file  Occupational History   Occupation: Patient care aide-sits with elderly lady  Tobacco Use   Smoking status: Former    Packs/day: 2.00    Years: 38.00    Additional pack years: 0.00    Total pack years: 76.00    Types: Cigarettes    Quit date: 03/07/2014    Years since quitting: 8.5   Smokeless tobacco: Never  Vaping Use   Vaping Use: Never used  Substance and Sexual Activity   Alcohol use: No    Alcohol/week: 0.0 standard drinks of alcohol   Drug use: No   Sexual activity: Not on file  Other Topics Concern   Not on file  Social History Narrative   [facility] (01/12/17):   Originally from [facility] [place] [facility]. Has lived in [place] for 17 years. Married. Has 2 dogs currently. No mold and only 1 indoor plant. Had her home professionally cleaned a month ago. No bird or hot tub exposure. 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    Right handed    Social Determinants of Health   Financial Resource Strain: High Risk (09/10/2021)   Overall Financial Resource Strain (CARDIA)    Difficulty of Paying Living Expenses: Very hard  Food Insecurity: Patient Declined (09/13/2022)   Hunger Vital Sign    Worried About Running Out of Food in the Last Year: Patient declined    Ran Out of Food in the Last Year: Patient declined  Transportation Needs: No Transportation Needs (09/22/2022)   PRAPARE - Scientist, research (physical sciences) (Medical): No    Lack of Transportation (Non-Medical): No  Physical Activity: Inactive (03/25/2021)   Exercise Vital Sign    Days of Exercise per Week: 0 days    Minutes of Exercise per Session: 0 min  Stress: No Stress Concern Present (09/10/2021)   [employer] of Occupational Health - Occupational Stress Questionnaire    Feeling of Stress : Not at all  Social Connections: Moderately Isolated (03/25/2021)   Social Connection and Isolation Panel [NHANES]    Frequency of Communication with Friends and Family: More than three times a week    Frequency of Social Gatherings with Friends and Family: More than three times a week    Attends Religious Services: Never    Database administrator or Organizations: No    Attends Banker Meetings: Never    Marital Status: Married  Catering manager Violence: Patient Declined (09/13/2022)   Humiliation, Afraid, Rape, and Kick questionnaire    Fear of Current or Ex-Partner: Patient declined    Emotionally Abused: Patient declined    Physically  Abused: Patient declined    Sexually Abused: Patient declined    Past Surgical History:  Procedure Laterality Date   ABDOMINAL HYSTERECTOMY     APPENDECTOMY  1969   CARDIAC CATHETERIZATION N/A 07/05/2015   Procedure: Left Heart Cath and Coronary Angiography;  Surgeon: Marykay Lex, MD;  Location: [facility] INVASIVE CV LAB;  Service: Cardiovascular;  Laterality: N/A;   CHOLECYSTECTOMY  1989   collapse lung  1990   COLONOSCOPY WITH PROPOFOL N/A 05/14/2018   Procedure: COLONOSCOPY WITH PROPOFOL;  Surgeon: Hilarie Fredrickson, MD;  Location: [facility] ENDOSCOPY;  Service: Endoscopy;  Laterality: N/A;   EXCISION MASS ABDOMINAL  2019   EXPLORATORY LAPAROTOMY  1969   LEFT HEART CATH AND CORONARY ANGIOGRAPHY N/A 11/19/2020   Procedure: LEFT HEART CATH AND CORONARY ANGIOGRAPHY;  Surgeon: Yvonne Kendall, MD;  Location: [facility] INVASIVE CV LAB;  Service: Cardiovascular;  Laterality: N/A;   POLYPECTOMY   05/14/2018   Procedure: POLYPECTOMY;  Surgeon: Hilarie Fredrickson, MD;  Location: [facility] ENDOSCOPY;  Service: Endoscopy;;   SALPINGOOPHORECTOMY  2019    Family History  Problem Relation Age of Onset   Cancer Mother        lung   Heart attack Mother    Hypertension Sister    Multiple sclerosis Brother    Diabetes Sister    Rheum arthritis Sister    Thyroid disease Sister    Multiple sclerosis Other    Alcohol abuse Other    Arthritis Other    Diabetes Other    Kidney disease Other    Cancer Other        lung,ovarian,skin, uterine   Stroke Other    Heart disease Other    Melanoma Other    Osteoporosis Other    Heart attack Maternal Uncle    Heart attack Other        NEICE   Heart attack Other        NEPHEW   Hypertension Brother    Stroke Maternal Grandmother    Colon cancer Neg Hx     Allergies  Allergen Reactions   Tape Other (See Comments)    Skin tears easily, peels skin off   Daliresp [Roflumilast] Other (See Comments)    Skin peeling   Cozaar [Losartan] Other (See Comments)    Possible cause of severe hypotension   Dupixent [Dupilumab] Diarrhea, Swelling and Cough    Facial swelling Headache   Latex Other (See Comments)    Peels skin off   Morphine And Codeine Other (See Comments)    Hallucinations    Penicillins Hives   Statins Other (See Comments)    Myalgia    Zestril [Lisinopril] Cough   Omnicef [Cefdinir] Rash    Current Outpatient Medications on File Prior to Visit  Medication Sig Dispense Refill   albuterol (VENTOLIN HFA) 108 (90 Base) MCG/ACT inhaler INHALE 2 PUFFS INTO THE LUNGS EVERY 4 TO 6 HOURS AS NEEDED FOR SHORTNESS OF BREATH OR WHEEZING (Patient taking differently: Inhale 2 puffs into the lungs See admin instructions. Inhale 2 puffs every 4-6 hours as needed for wheezing or shortness or breath.) 18 g 3   aspirin EC 81 MG EC tablet Take 1 tablet (81 mg total) by mouth daily.     bisoprolol (ZEBETA) 5 MG tablet Take 0.5 tablets (2.5 mg total) by  mouth daily as needed (Take HALF tablet by mouth for break through chest pain as needed). (Patient taking differently: Take 2.5 mg by mouth See admin instructions. 2.5  mg once daily. May take an additional 2.5 mg as needed for break through chest pain.) 45 tablet 3   budesonide-formoterol (SYMBICORT) 160-4.5 MCG/ACT inhaler Inhale 2 puffs into the lungs 2 (two) times daily. 30 g 2   Carboxymethylcellulose Sodium (ARTIFICIAL TEARS OP) Place 1 drop into both eyes 2 (two) times daily as needed (dryness, irritation).     cetirizine (ZYRTEC) 10 MG tablet Take 10 mg by mouth daily.     Cholecalciferol (VITAMIN D3 PO) Take 5,000 Units by mouth daily.     Cyanocobalamin (VITAMIN B-12 PO) Take 1 tablet by mouth daily.     dapagliflozin propanediol (FARXIGA) 10 MG TABS tablet Take 1 tablet (10 mg total) by mouth daily. 30 tablet 0   ezetimibe (ZETIA) 10 MG tablet TAKE 1 TABLET(10 MG) BY MOUTH DAILY (Patient taking differently: Take 10 mg by mouth daily.) 90 tablet 1   furosemide (LASIX) 40 MG tablet Take 1 tablet (40 mg total) by mouth daily. (Patient taking differently: Take 40 mg by mouth daily as needed for fluid.) 90 tablet 3   HYDROcodone-acetaminophen (NORCO) 7.5-325 MG tablet Take 1 tablet by mouth every 6 (six) hours as needed for moderate pain. 30 tablet 0   ipratropium-albuterol (DUONEB) 0.5-2.5 (3) MG/3ML SOLN USE 3 ML VIA NEBULIZER EVERY 6 HOURS AS NEEDED (Patient taking differently: Take 3 mLs by nebulization every 6 (six) hours as needed (wheezing, shortness of breath).) 360 mL 5   magnesium oxide (MAG-OX) 400 MG tablet Take 400 mg by mouth daily as needed (constipation).     MUCINEX MAXIMUM STRENGTH 1200 MG TB12 Take 1,200 mg by mouth every 12 (twelve) hours.     nitroGLYCERIN (NITROSTAT) 0.4 MG SL tablet Place 1 tablet (0.4 mg total) under the tongue every 5 (five) minutes as needed for chest pain. 25 tablet 3   ondansetron (ZOFRAN) 4 MG tablet Take 1 tablet (4 mg total) by mouth every 8 (eight)  hours as needed for nausea or vomiting. 20 tablet 0   OXYGEN Inhale 2 L/min into the lungs continuous.     PARoxetine (PAXIL) 40 MG tablet TAKE 1 TABLET(40 MG) BY MOUTH EVERY MORNING (Patient taking differently: Take 40 mg by mouth daily.) 90 tablet 1   polyethylene glycol powder (GLYCOLAX/MIRALAX) 17 GM/SCOOP powder Take 17 g by mouth daily as needed (constipation).     potassium chloride (KLOR-CON) 10 MEQ tablet Take 1 tablet (10 mEq total) by mouth as needed (take with furosemide (lasix)). (Patient taking differently: Take 10 mEq by mouth daily as needed (with furosemide.).) 30 tablet 11   predniSONE (DELTASONE) 10 MG tablet TAKE 1 TABLET(10 MG) BY MOUTH DAILY (Patient taking differently: Take 10 mg by mouth daily.) 60 tablet 2   predniSONE (DELTASONE) 10 MG tablet Take 4 tablets daily for three days, then take 3 tablets daily for three days, then take 2 tablets daily for three days, then take 1 tablet daily for three days, then continue with usual regimen of prednisone. 30 tablet 0   predniSONE (DELTASONE) 5 MG tablet TAKE 1 TABLET(5 MG) BY MOUTH DAILY WITH BREAKFAST (Patient taking differently: Take 5 mg by mouth daily with breakfast.) 60 tablet 1   rosuvastatin (CRESTOR) 20 MG tablet Take 1 tablet (20 mg total) by mouth daily. (Patient taking differently: Take 20 mg by mouth every evening.) 90 tablet 3   Spacer/Aero Chamber Mouthpiece MISC 1 Device by Does not apply route as directed. 1 each 0   spironolactone (ALDACTONE) 25 MG tablet Take 0.5  tablets (12.5 mg total) by mouth daily. 15 tablet 0   No current facility-administered medications on file prior to visit.    BP 120/62   Pulse 67   Temp 98.1 F (36.7 C) (Oral)   Ht 5' 1.5" (1.562 m)   Wt 136 lb (61.7 kg)   SpO2 97% Comment: O2 level 3  BMI 25.28 kg/m       Objective:   Physical Exam Vitals and nursing note reviewed.  Constitutional:      Appearance: Normal appearance.  HENT:     Mouth/Throat:     Dentition: Has  dentures.     Comments: Thrush noted on tongue and back of throat   Cardiovascular:     Rate and Rhythm: Normal rate and regular rhythm.     Pulses: Normal pulses.     Heart sounds: Normal heart sounds.  Pulmonary:     Effort: Pulmonary effort is normal.     Breath sounds: Normal breath sounds.     Comments: 02 via [place]  Musculoskeletal:        General: Normal range of motion.  Skin:    General: Skin is warm and dry.     Findings: Bruising (significant bruising on bilateral arms R>L from blood draw) present.     Comments: Well healed skin tear on left upper arm   Neurological:     General: No focal deficit present.     Mental Status: She is alert and oriented to person, place, and time.     Motor: Weakness present.     Gait: Gait abnormal (in wheelchair for exam).  Psychiatric:        Mood and Affect: Mood normal.        Thought Content: Thought content normal.        Judgment: Judgment normal.       Assessment & Plan:   1. COPD with acute exacerbation [facility]) - Reviewed hospital notes, discharge instructions, labs, imaging and medication changes. All questions answered to the best of my ability.  - She appears at baseline  - Follow up with pulmonary as directed - Comprehensive metabolic panel; Future - CBC; Future - CBC - Comprehensive metabolic panel  2. Acute on chronic systolic CHF (congestive heart failure) (HCC) - Continue with spirolactone. Follow up with cardiology as directed - Comprehensive metabolic panel; Future - CBC; Future - CBC - Comprehensive metabolic panel  3. Essential hypertension - Controlled. No change in medication  - Comprehensive metabolic panel; Future - CBC; Future - CBC - Comprehensive metabolic panel  4. AKI (acute kidney injury) (HCC)  - Comprehensive metabolic panel; Future - CBC; Future - CBC - Comprehensive metabolic panel  5. Anxiety and depression - Needs refill  - Comprehensive metabolic panel; Future - CBC; Future -  ALPRAZolam (XANAX) 0.5 MG tablet; Take 1 tablet (0.5 mg total) by mouth every 6 (six) hours.  Dispense: 90 tablet; Refill: 2 - CBC - Comprehensive metabolic panel  6. Chronic intractable headache, unspecified headache type - needs refill  - butalbital-acetaminophen-caffeine (FIORICET) 50-325-40 MG tablet; Take 1 tablet by mouth every 4 (four) hours as needed for headache. TAKE 1 TABLET BY MOUTH EVERY 4 HOURS AS NEEDED FOR HEADACHETAKE 1 TABLET BY MOUTH EVERY 4 HOURS AS NEEDED FOR HEADACHE  Dispense: 14 tablet; Refill: 3  7. Gait instability  - Ambulatory referral to Physical Therapy  8. Thrush  - nystatin (MYCOSTATIN) 100000 UNIT/ML suspension; Take 5 mLs (500,000 Units total) by mouth 4 (four) times  daily.  Dispense: 60 mL; Refill: 2   Shirline Frees, NP

## 2022-09-24 NOTE — Telephone Encounter (Signed)
CRITICAL VALUE STICKER  CRITICAL VALUE: White Blood count 21.7  RECEIVER (on-site recipient of call): [employer] office  DATE & TIME NOTIFIED:  2:48 pm 09/24/2022  MESSENGER (representative from lab): Clydie Braun   MD NOTIFIED:  Via epic   TIME OF NOTIFICATION: 2:48 pm  RESPONSE:  N/A

## 2022-09-24 NOTE — Patient Instructions (Signed)
Health Maintenance Due  Topic Date Due   Hepatitis C Screening  Never done   Zoster Vaccines- Shingrix (1 of 2) Never done   MAMMOGRAM  08/08/2018   COVID-19 Vaccine (3 - 2023-24 season) 12/06/2021   Medicare Annual Wellness (AWV)  03/25/2022      Row Labels 09/10/2021    9:16 AM 03/25/2021    3:08 PM 03/22/2020    3:24 PM  Depression screen PHQ 2/9   Section Header. No data exists in this row.     Decreased Interest   1 1 0  Down, Depressed, Hopeless   1 1 0  PHQ - 2 Score   2 2 0  Altered sleeping   0 0 0  Tired, decreased energy   3 3 0  Change in appetite   0 0 0  Feeling bad or failure about yourself    1 1 0  Trouble concentrating   0 0 0  Moving slowly or fidgety/restless   0 0 0  Suicidal thoughts   0 0 0  PHQ-9 Score   6 6 0  Difficult doing work/chores    Not difficult at all Not difficult at all

## 2022-09-25 ENCOUNTER — Encounter: Payer: Self-pay | Admitting: Adult Health

## 2022-09-29 NOTE — Telephone Encounter (Signed)
**Note De-Identified Suanne Minahan Obfuscation** I have faxed the pts [place] Farxiga to [place]. Fax: Tx 'ok' Report CONE_EMAIL-to-Fax  Imya Mance, Elease Hashimoto    This message was sent Jeramie Scogin [facility], a product from [employer]. http://www.biscom.com/  [employer], a [employer], pioneered [employer] and continues to innovate the most advanced and intelligent fax and [employer] for enterprises. www.biscom.com -------Fax Transmission Report-------  To:               Recipient at [phone] Subject:          Fw: Farxiga Assistance Result:           The transmission was successful. Explanation:      All Pages Ok Pages Sent:       4 Connect Time:     2 minutes, 37 seconds Transmit Time:    09/29/2022 15:20 Transfer Rate:    14400 Status Code:      0000 Retry Count:      1 Job Id:           [id number] Unique Id:        [id number] Fax Line:         55 Fax Server:       [id number]

## 2022-09-29 NOTE — Telephone Encounter (Signed)
Signed forms scanned to patient assistance nurse.

## 2022-09-30 ENCOUNTER — Other Ambulatory Visit: Payer: Self-pay | Admitting: Adult Health

## 2022-09-30 ENCOUNTER — Ambulatory Visit: Payer: Medicare Other | Admitting: Physical Therapy

## 2022-09-30 ENCOUNTER — Encounter (HOSPITAL_COMMUNITY): Payer: Medicare Other

## 2022-09-30 DIAGNOSIS — F419 Anxiety disorder, unspecified: Secondary | ICD-10-CM

## 2022-09-30 NOTE — Telephone Encounter (Signed)
**Note De-Identified Anhelica Fowers Obfuscation** Letter received from [facility] and Me stating that they have approved the pt for Farxiga assistance until 04/07/2023. PEP [id number]  The letter states that they have also notified the pt of this approval as well.

## 2022-10-01 ENCOUNTER — Ambulatory Visit: Payer: Medicare Other

## 2022-10-01 IMAGING — CR DG CHEST 2V
2 series · 2 of 2 positions shown · non-contrast
Comparison: CT chest 05/08/2021, chest radiograph 11/15/2020

CLINICAL DATA: Shortness of breath, COPD

EXAM:
CHEST - 2 VIEW

[chest pa]
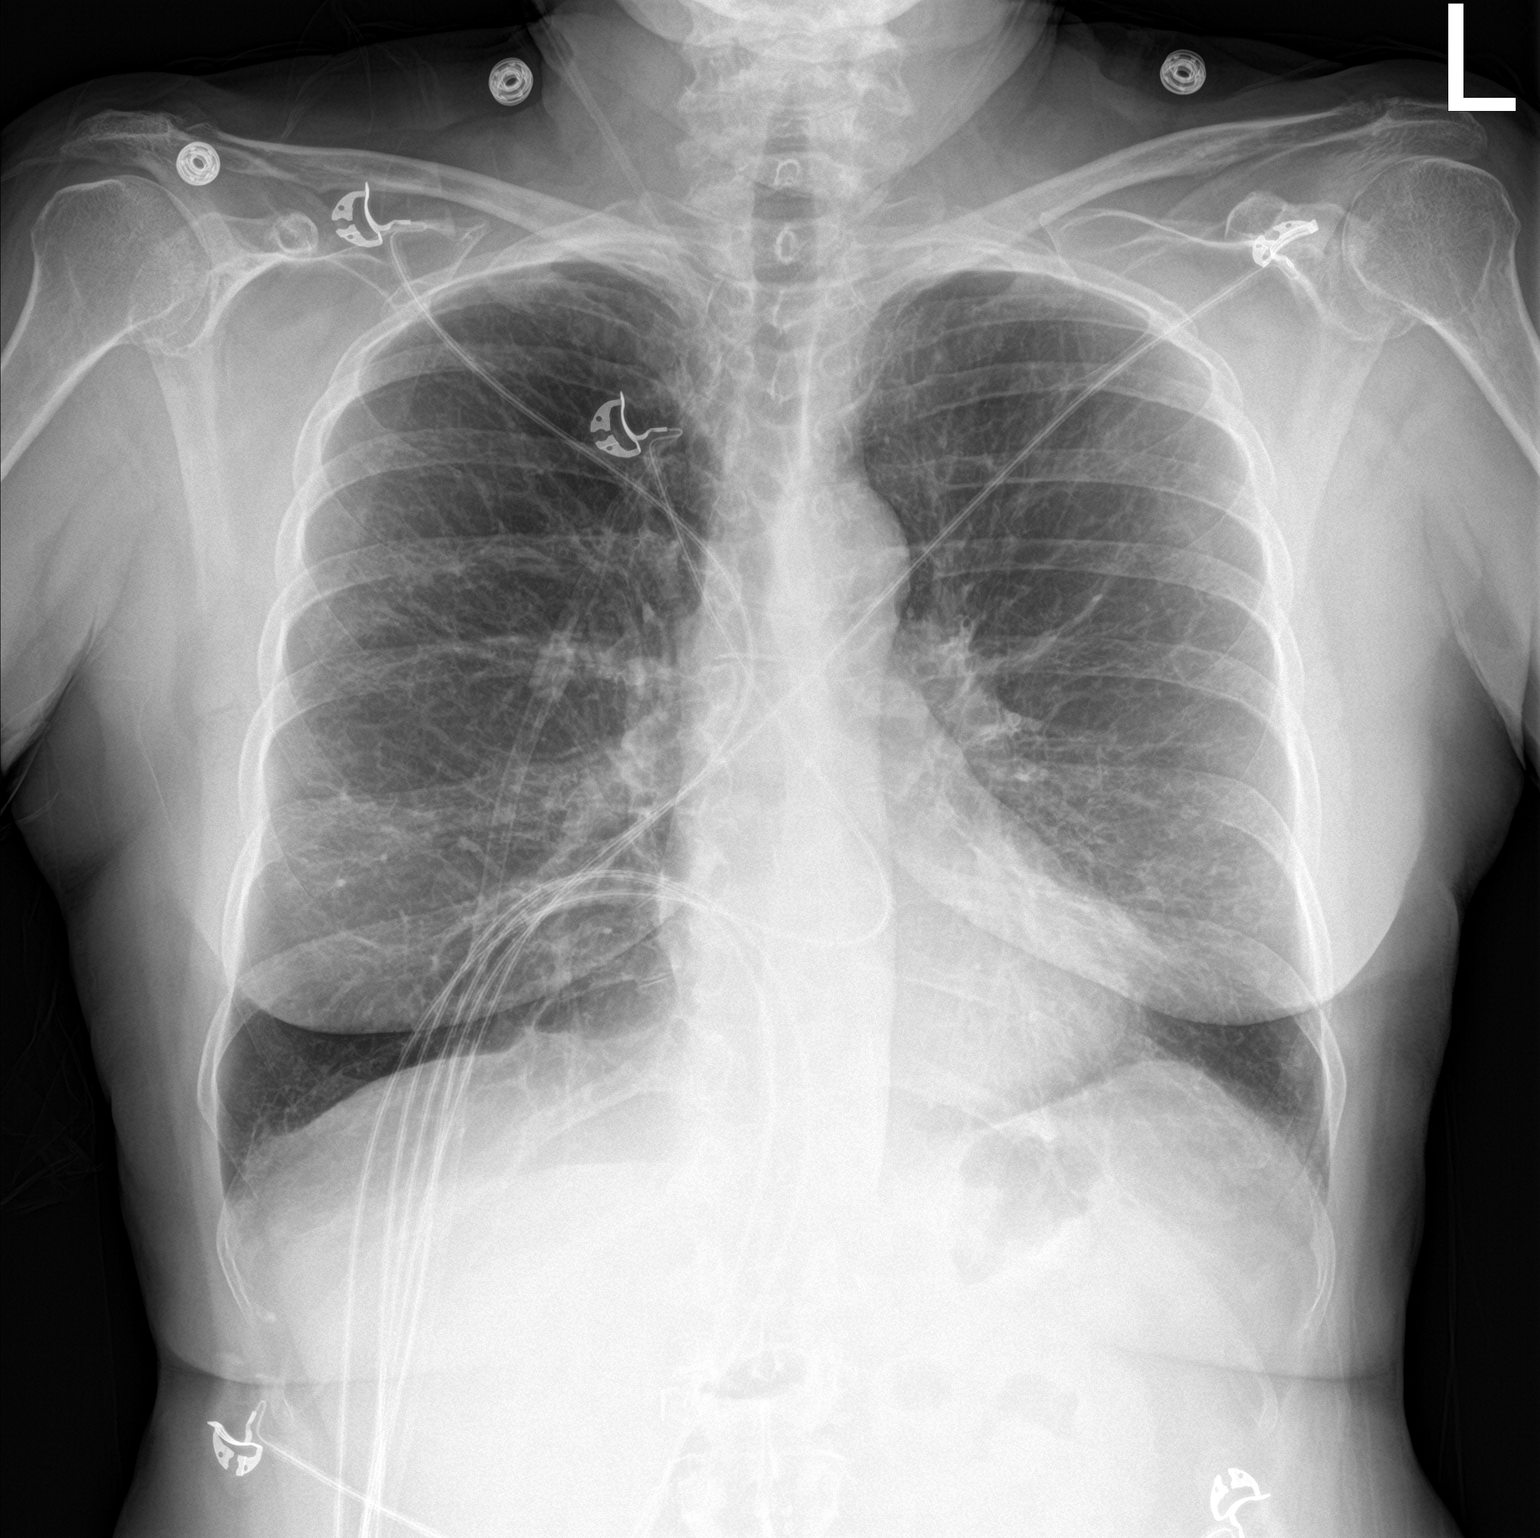

[chest lat]
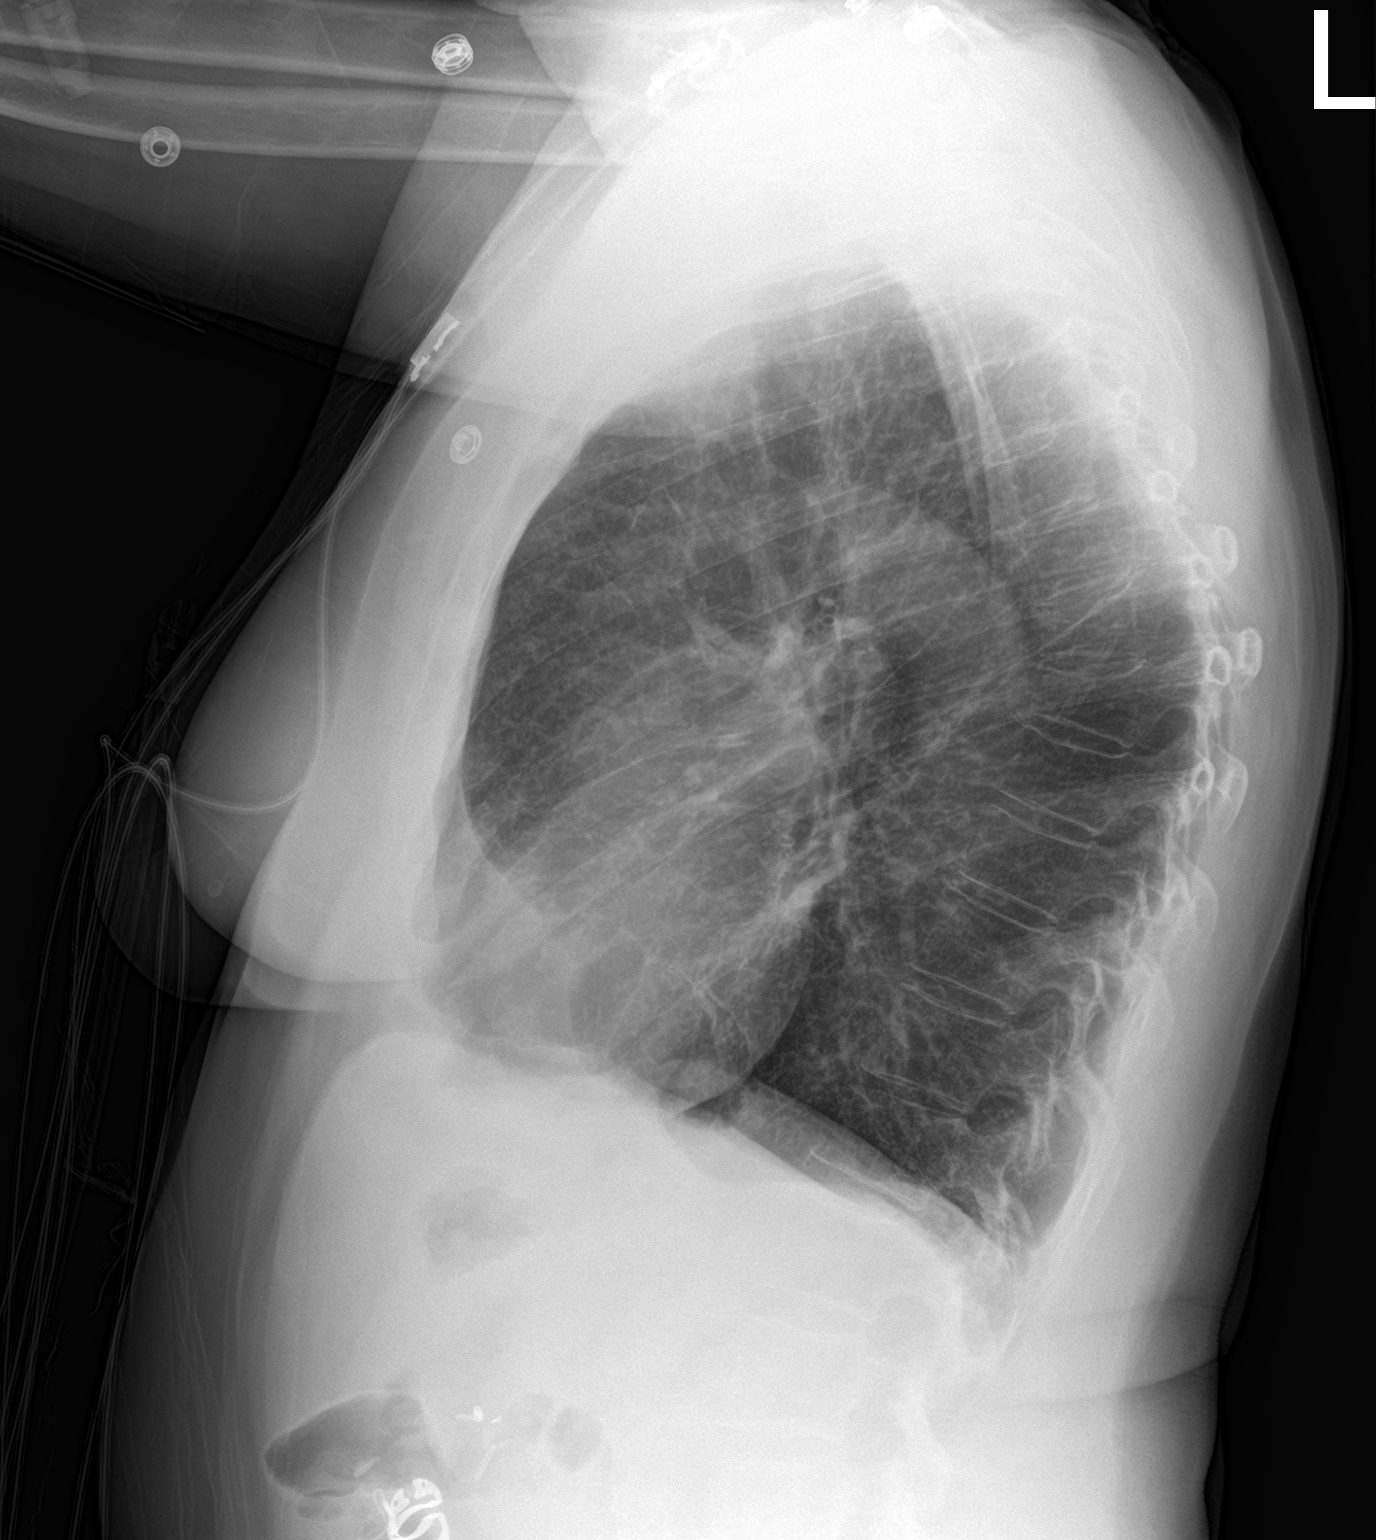

[2 of 2 positions shown; findings below may reference images not displayed]

FINDINGS: The cardiomediastinal silhouette is normal.

The lungs hyperinflated with flattening of the diaphragms consistent
with underlying COPD. There is no focal consolidation or pulmonary
edema. There is no pleural effusion or pneumothorax

Compression deformity of 2 midthoracic and 1 lower thoracic
vertebral body is unchanged. Cholecystectomy clips are noted.
IMPRESSION: COPD.  No radiographic evidence of acute cardiopulmonary process.

## 2022-10-03 ENCOUNTER — Other Ambulatory Visit: Payer: Self-pay

## 2022-10-03 MED ORDER — BISOPROLOL FUMARATE 5 MG PO TABS: 2.5000 mg | ORAL_TABLET | Freq: Every day | ORAL | 2 refills | Status: DC | PRN

## 2022-10-04 ENCOUNTER — Inpatient Hospital Stay (HOSPITAL_COMMUNITY)
Admission: EM | Admit: 2022-10-04 | Discharge: 2022-10-07 | DRG: 190 | Disposition: A | Discharge status: Home / Self Care | Payer: Medicare Other | Attending: Internal Medicine | Admitting: Internal Medicine

## 2022-10-04 ENCOUNTER — Emergency Department (HOSPITAL_COMMUNITY): Payer: Medicare Other

## 2022-10-04 ENCOUNTER — Encounter (HOSPITAL_COMMUNITY): Payer: Self-pay

## 2022-10-04 ENCOUNTER — Other Ambulatory Visit: Payer: Self-pay

## 2022-10-04 DIAGNOSIS — Z6825 Body mass index (BMI) 25.0-25.9, adult: Secondary | ICD-10-CM

## 2022-10-04 DIAGNOSIS — Z7982 Long term (current) use of aspirin: Secondary | ICD-10-CM

## 2022-10-04 DIAGNOSIS — Z87442 Personal history of urinary calculi: Secondary | ICD-10-CM

## 2022-10-04 DIAGNOSIS — Z1152 Encounter for screening for COVID-19: Secondary | ICD-10-CM | POA: Diagnosis not present

## 2022-10-04 DIAGNOSIS — I340 Nonrheumatic mitral (valve) insufficiency: Secondary | ICD-10-CM | POA: Diagnosis present

## 2022-10-04 DIAGNOSIS — I130 Hypertensive heart and chronic kidney disease with heart failure and stage 1 through stage 4 chronic kidney disease, or unspecified chronic kidney disease: Secondary | ICD-10-CM | POA: Diagnosis present

## 2022-10-04 DIAGNOSIS — Z9049 Acquired absence of other specified parts of digestive tract: Secondary | ICD-10-CM | POA: Diagnosis not present

## 2022-10-04 DIAGNOSIS — Z833 Family history of diabetes mellitus: Secondary | ICD-10-CM | POA: Diagnosis not present

## 2022-10-04 DIAGNOSIS — Z8249 Family history of ischemic heart disease and other diseases of the circulatory system: Secondary | ICD-10-CM

## 2022-10-04 DIAGNOSIS — I5022 Chronic systolic (congestive) heart failure: Secondary | ICD-10-CM | POA: Diagnosis present

## 2022-10-04 DIAGNOSIS — Z7952 Long term (current) use of systemic steroids: Secondary | ICD-10-CM

## 2022-10-04 DIAGNOSIS — Z9981 Dependence on supplemental oxygen: Secondary | ICD-10-CM

## 2022-10-04 DIAGNOSIS — E669 Obesity, unspecified: Secondary | ICD-10-CM | POA: Diagnosis present

## 2022-10-04 DIAGNOSIS — G4733 Obstructive sleep apnea (adult) (pediatric): Secondary | ICD-10-CM | POA: Diagnosis present

## 2022-10-04 DIAGNOSIS — N179 Acute kidney failure, unspecified: Secondary | ICD-10-CM | POA: Diagnosis present

## 2022-10-04 DIAGNOSIS — R0602 Shortness of breath: Secondary | ICD-10-CM | POA: Diagnosis not present

## 2022-10-04 DIAGNOSIS — I214 Non-ST elevation (NSTEMI) myocardial infarction: Secondary | ICD-10-CM

## 2022-10-04 DIAGNOSIS — Z888 Allergy status to other drugs, medicaments and biological substances status: Secondary | ICD-10-CM

## 2022-10-04 DIAGNOSIS — Z8349 Family history of other endocrine, nutritional and metabolic diseases: Secondary | ICD-10-CM

## 2022-10-04 DIAGNOSIS — Z66 Do not resuscitate: Secondary | ICD-10-CM | POA: Diagnosis present

## 2022-10-04 DIAGNOSIS — Z880 Allergy status to penicillin: Secondary | ICD-10-CM

## 2022-10-04 DIAGNOSIS — J441 Chronic obstructive pulmonary disease with (acute) exacerbation: Secondary | ICD-10-CM | POA: Diagnosis not present

## 2022-10-04 DIAGNOSIS — E785 Hyperlipidemia, unspecified: Secondary | ICD-10-CM | POA: Diagnosis present

## 2022-10-04 DIAGNOSIS — Z820 Family history of epilepsy and other diseases of the nervous system: Secondary | ICD-10-CM

## 2022-10-04 DIAGNOSIS — F32A Depression, unspecified: Secondary | ICD-10-CM | POA: Diagnosis present

## 2022-10-04 DIAGNOSIS — I10 Essential (primary) hypertension: Secondary | ICD-10-CM | POA: Diagnosis present

## 2022-10-04 DIAGNOSIS — N1831 Chronic kidney disease, stage 3a: Secondary | ICD-10-CM | POA: Diagnosis present

## 2022-10-04 DIAGNOSIS — F419 Anxiety disorder, unspecified: Secondary | ICD-10-CM | POA: Diagnosis present

## 2022-10-04 DIAGNOSIS — I5023 Acute on chronic systolic (congestive) heart failure: Secondary | ICD-10-CM

## 2022-10-04 DIAGNOSIS — Z8261 Family history of arthritis: Secondary | ICD-10-CM

## 2022-10-04 DIAGNOSIS — J45901 Unspecified asthma with (acute) exacerbation: Secondary | ICD-10-CM | POA: Diagnosis present

## 2022-10-04 DIAGNOSIS — Z87891 Personal history of nicotine dependence: Secondary | ICD-10-CM

## 2022-10-04 DIAGNOSIS — Z823 Family history of stroke: Secondary | ICD-10-CM

## 2022-10-04 DIAGNOSIS — Z808 Family history of malignant neoplasm of other organs or systems: Secondary | ICD-10-CM

## 2022-10-04 DIAGNOSIS — Z8262 Family history of osteoporosis: Secondary | ICD-10-CM

## 2022-10-04 DIAGNOSIS — I2510 Atherosclerotic heart disease of native coronary artery without angina pectoris: Secondary | ICD-10-CM | POA: Diagnosis present

## 2022-10-04 DIAGNOSIS — Z90710 Acquired absence of both cervix and uterus: Secondary | ICD-10-CM

## 2022-10-04 DIAGNOSIS — N1832 Chronic kidney disease, stage 3b: Secondary | ICD-10-CM | POA: Diagnosis present

## 2022-10-04 DIAGNOSIS — D72829 Elevated white blood cell count, unspecified: Secondary | ICD-10-CM | POA: Diagnosis present

## 2022-10-04 DIAGNOSIS — J9621 Acute and chronic respiratory failure with hypoxia: Secondary | ICD-10-CM | POA: Diagnosis present

## 2022-10-04 DIAGNOSIS — Z7951 Long term (current) use of inhaled steroids: Secondary | ICD-10-CM

## 2022-10-04 DIAGNOSIS — Z79899 Other long term (current) drug therapy: Secondary | ICD-10-CM

## 2022-10-04 DIAGNOSIS — Z91040 Latex allergy status: Secondary | ICD-10-CM

## 2022-10-04 LAB — COMPREHENSIVE METABOLIC PANEL
ALT: 29 U/L (ref 0–44)
AST: 23 U/L (ref 15–41)
Albumin: 3.6 g/dL (ref 3.5–5.0)
Alkaline Phosphatase: 69 U/L (ref 38–126)
Anion gap: 14 (ref 5–15)
BUN: 27 mg/dL — ABNORMAL HIGH (ref 8–23)
CO2: 28 mmol/L (ref 22–32)
Calcium: 10.3 mg/dL (ref 8.9–10.3)
Chloride: 98 mmol/L (ref 98–111)
Creatinine, Ser: 1.23 mg/dL — ABNORMAL HIGH (ref 0.44–1.00)
GFR, Estimated: 48 mL/min — ABNORMAL LOW (ref >60)
Glucose, Bld: 161 mg/dL — ABNORMAL HIGH (ref 70–99)
Potassium: 3.7 mmol/L (ref 3.5–5.1)
Sodium: 140 mmol/L (ref 135–145)
Total Bilirubin: 0.8 mg/dL (ref 0.3–1.2)
Total Protein: 6.4 g/dL — ABNORMAL LOW (ref 6.5–8.1)

## 2022-10-04 LAB — TROPONIN I (HIGH SENSITIVITY)
Troponin I (High Sensitivity): 37 ng/L — ABNORMAL HIGH (ref <18)
Troponin I (High Sensitivity): 33 ng/L — ABNORMAL HIGH (ref <18)

## 2022-10-04 LAB — SARS CORONAVIRUS 2 BY RT PCR: SARS Coronavirus 2 by RT PCR: NEGATIVE

## 2022-10-04 LAB — CBC
HCT: 46.4 % — ABNORMAL HIGH (ref 36.0–46.0)
Hemoglobin: 14.5 g/dL (ref 12.0–15.0)
MCH: 31 pg (ref 26.0–34.0)
MCHC: 31.3 g/dL (ref 30.0–36.0)
MCV: 99.4 fL (ref 80.0–100.0)
Platelets: 225 10*3/uL (ref 150–400)
RBC: 4.67 MIL/uL (ref 3.87–5.11)
RDW: 14 % (ref 11.5–15.5)
WBC: 15.3 10*3/uL — ABNORMAL HIGH (ref 4.0–10.5)
nRBC: 0 % (ref 0.0–0.2)

## 2022-10-04 LAB — BRAIN NATRIURETIC PEPTIDE: B Natriuretic Peptide: 441.9 pg/mL — ABNORMAL HIGH (ref 0.0–100.0)

## 2022-10-04 IMAGING — DX DG CHEST 1V PORT
1 series · 1 of 1 positions shown · non-contrast
Comparison: 06/26/2021

CLINICAL DATA: Shortness of breath.

EXAM:
PORTABLE CHEST 1 VIEW

[chest]
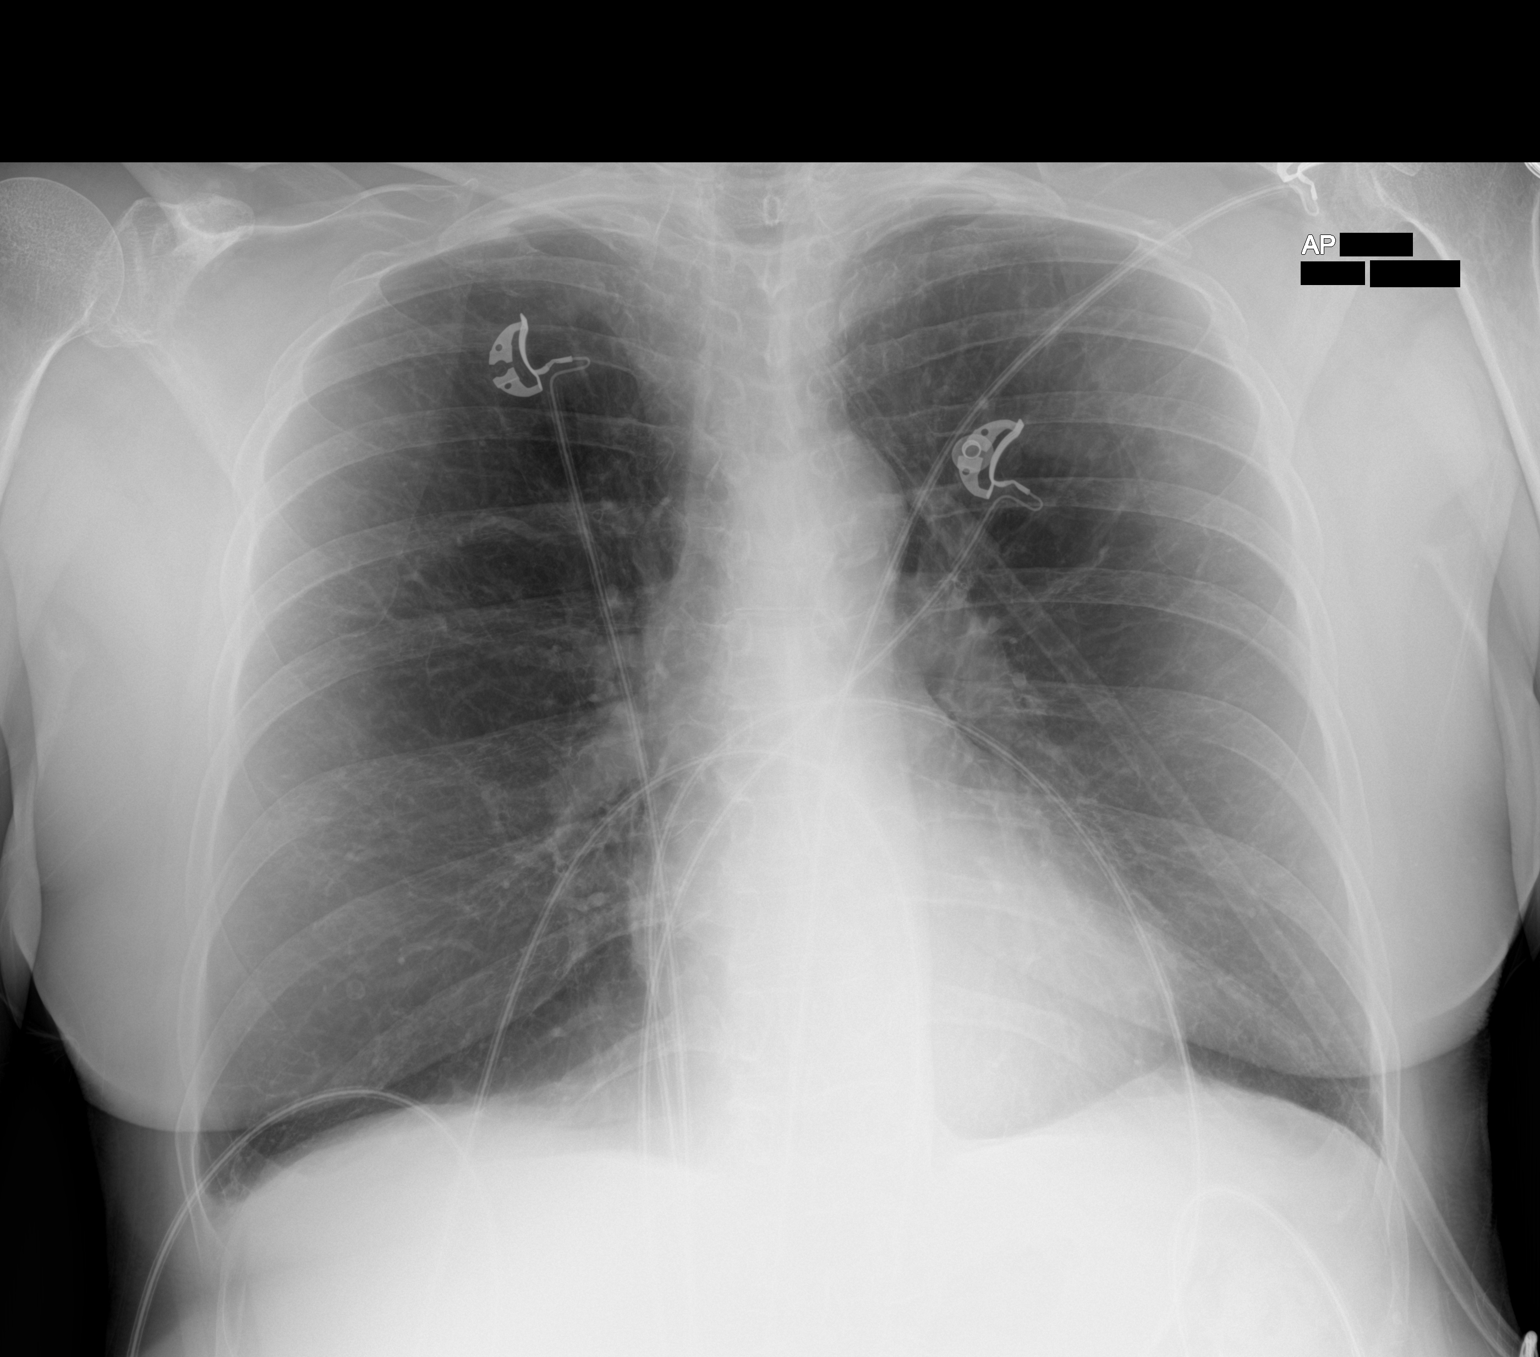

[1 of 1 positions shown; findings below may reference images not displayed]

FINDINGS: The heart size and mediastinal contours are within normal limits.
Pulmonary hyperinflation again seen, suspicious for COPD. Both lungs
are clear. The visualized skeletal structures are unremarkable.
IMPRESSION: COPD. No active disease.

## 2022-10-04 MED ORDER — ACETAMINOPHEN 650 MG RE SUPP: 650.0000 mg | Freq: Four times a day (QID) | RECTAL | Status: DC | PRN

## 2022-10-04 MED ORDER — BISOPROLOL FUMARATE 5 MG PO TABS
2.5000 mg | ORAL_TABLET | Freq: Every day | ORAL | Status: DC
  Administered 2022-10-05 – 2022-10-07 (×3): 2.5 mg via ORAL
  Filled 2022-10-04 (×3): qty 0.5

## 2022-10-04 MED ORDER — MAGNESIUM OXIDE -MG SUPPLEMENT 400 (240 MG) MG PO TABS: 400.0000 mg | ORAL_TABLET | Freq: Every day | ORAL | Status: DC | PRN

## 2022-10-04 MED ORDER — ENOXAPARIN SODIUM 40 MG/0.4ML IJ SOSY: 40.0000 mg | PREFILLED_SYRINGE | INTRAMUSCULAR | Status: DC

## 2022-10-04 MED ORDER — IPRATROPIUM-ALBUTEROL 0.5-2.5 (3) MG/3ML IN SOLN: 3.0000 mL | RESPIRATORY_TRACT | Status: DC | PRN

## 2022-10-04 MED ORDER — MOMETASONE FURO-FORMOTEROL FUM 200-5 MCG/ACT IN AERO
2.0000 | INHALATION_SPRAY | Freq: Two times a day (BID) | RESPIRATORY_TRACT | Status: DC
  Administered 2022-10-04 – 2022-10-05 (×3): 2 via RESPIRATORY_TRACT
  Filled 2022-10-04 (×2): qty 8.8

## 2022-10-04 MED ORDER — ALBUTEROL SULFATE HFA 108 (90 BASE) MCG/ACT IN AERS: 1.0000 | INHALATION_SPRAY | RESPIRATORY_TRACT | Status: DC | PRN

## 2022-10-04 MED ORDER — ROSUVASTATIN CALCIUM 20 MG PO TABS
20.0000 mg | ORAL_TABLET | Freq: Every day | ORAL | Status: DC
  Administered 2022-10-04 – 2022-10-07 (×4): 20 mg via ORAL
  Filled 2022-10-04 (×4): qty 1

## 2022-10-04 MED ORDER — ASPIRIN 81 MG PO TBEC
81.0000 mg | DELAYED_RELEASE_TABLET | Freq: Every day | ORAL | Status: DC
  Administered 2022-10-05 – 2022-10-07 (×3): 81 mg via ORAL
  Filled 2022-10-04 (×3): qty 1

## 2022-10-04 MED ORDER — AZITHROMYCIN 500 MG PO TABS
500.0000 mg | ORAL_TABLET | Freq: Every day | ORAL | Status: DC
  Administered 2022-10-04 – 2022-10-07 (×4): 500 mg via ORAL
  Filled 2022-10-04 (×4): qty 1

## 2022-10-04 MED ORDER — PAROXETINE HCL 20 MG PO TABS
40.0000 mg | ORAL_TABLET | Freq: Every day | ORAL | Status: DC
  Administered 2022-10-05 – 2022-10-07 (×3): 40 mg via ORAL
  Filled 2022-10-04 (×3): qty 2

## 2022-10-04 MED ORDER — METHYLPREDNISOLONE SODIUM SUCC 40 MG IJ SOLR
40.0000 mg | Freq: Two times a day (BID) | INTRAMUSCULAR | Status: DC
  Administered 2022-10-04 – 2022-10-06 (×4): 40 mg via INTRAVENOUS
  Filled 2022-10-04 (×4): qty 1

## 2022-10-04 MED ORDER — POLYETHYLENE GLYCOL 3350 17 G PO PACK: 17.0000 g | PACK | Freq: Every day | ORAL | Status: DC | PRN

## 2022-10-04 MED ORDER — ACETAMINOPHEN 325 MG PO TABS: 650.0000 mg | ORAL_TABLET | Freq: Four times a day (QID) | ORAL | Status: DC | PRN

## 2022-10-04 MED ORDER — SPIRONOLACTONE 12.5 MG HALF TABLET: 12.5000 mg | ORAL_TABLET | Freq: Every day | ORAL | Status: DC

## 2022-10-04 MED ORDER — ONDANSETRON HCL 4 MG PO TABS: 4.0000 mg | ORAL_TABLET | Freq: Four times a day (QID) | ORAL | Status: DC | PRN

## 2022-10-04 MED ORDER — ALPRAZOLAM 0.5 MG PO TABS: 0.5000 mg | ORAL_TABLET | Freq: Four times a day (QID) | ORAL | Status: DC

## 2022-10-04 MED ORDER — EZETIMIBE 10 MG PO TABS
10.0000 mg | ORAL_TABLET | Freq: Every day | ORAL | Status: DC
  Administered 2022-10-04 – 2022-10-07 (×4): 10 mg via ORAL
  Filled 2022-10-04 (×4): qty 1

## 2022-10-04 MED ORDER — IPRATROPIUM-ALBUTEROL 0.5-2.5 (3) MG/3ML IN SOLN
3.0000 mL | Freq: Four times a day (QID) | RESPIRATORY_TRACT | Status: DC
  Administered 2022-10-04 – 2022-10-06 (×6): 3 mL via RESPIRATORY_TRACT
  Filled 2022-10-04 (×6): qty 3

## 2022-10-04 MED ORDER — ALBUTEROL SULFATE HFA 108 (90 BASE) MCG/ACT IN AERS
2.0000 | INHALATION_SPRAY | RESPIRATORY_TRACT | Status: DC | PRN
  Filled 2022-10-04: qty 6.7

## 2022-10-04 MED ORDER — LORATADINE 10 MG PO TABS
10.0000 mg | ORAL_TABLET | Freq: Every day | ORAL | Status: DC
  Administered 2022-10-04 – 2022-10-07 (×4): 10 mg via ORAL
  Filled 2022-10-04 (×4): qty 1

## 2022-10-04 MED ORDER — HYDROCODONE-ACETAMINOPHEN 7.5-325 MG PO TABS: 1.0000 | ORAL_TABLET | Freq: Four times a day (QID) | ORAL | Status: DC | PRN

## 2022-10-04 MED ORDER — ONDANSETRON HCL 4 MG/2ML IJ SOLN: 4.0000 mg | Freq: Four times a day (QID) | INTRAMUSCULAR | Status: DC | PRN

## 2022-10-04 MED ORDER — BUTALBITAL-APAP-CAFFEINE 50-325-40 MG PO TABS: 1.0000 | ORAL_TABLET | ORAL | Status: DC | PRN

## 2022-10-04 MED ORDER — ALPRAZOLAM 0.5 MG PO TABS
0.5000 mg | ORAL_TABLET | Freq: Four times a day (QID) | ORAL | Status: DC | PRN
  Administered 2022-10-04 – 2022-10-07 (×7): 0.5 mg via ORAL
  Filled 2022-10-04 (×7): qty 1

## 2022-10-04 NOTE — Assessment & Plan Note (Addendum)
Patient's shortness of breath and productive cough are most likely caused by acute COPD exacerbation - possibly related to recent air conditioning failure (now has 2 window units)  She has history of O2-dependent COPD, currently on her usual 2L [place] O2 She does not have fever and has mild leukocytosis Chest x-ray is not consistent with pneumonia Will admit patient - with her failure of outpatient therapy and recurrent need for hospitalization, it seems likely that she will need several days of hospitalization to show sufficient improvement for discharge. Nebulizers: scheduled Duoneb and prn albuterol Solu-Medrol 40 mg IV BID -> Prednisone 40 mg PO daily PO Azithromycin  Continue Symbicort (Dulera per formulary) Coordinated care with [facility] team/PT/OT/Nutrition/RT consults Continue bipap at night Keep 02 saturation 88% or greater

## 2022-10-04 NOTE — H&P (Addendum)
History and Physical    Patient: Kim [name] KGM:[id number] DOB: 1955/07/30 DOA: 10/04/2022 DOS: the patient was seen and examined on 10/04/2022 PCP: Shirline Frees, NP  Patient coming from: Home  Chief Complaint:  Chief Complaint  Patient presents with   COPD   Shortness of Breath   HPI: Kim [name] is a 67 y.o. female with medical history significant of COPD/ asthma, heart failure, subarachnoid hemorrhage, hypertension, coronary artery disease and depression who presented with dyspnea and chest pressure.   She had a recent hospitalization for COPD/ asthma exacerbation 06/05 to 09/18/22. She was discharged on slow prednisone taper.  At home she did not return to her baseline, she continue to have dyspnea. She was not able to do her usual routine activities due to dyspnea.  Her home AC broke for a few days and she was exposed to high temperatures at home. About 2 days ago she had a new AC units placed. She also has device to dehumidify the environment.  Lat night she was seating watching TV when she felt hot, "like hot flash", along with chest pressure and dyspnea. Moderate to severe symptoms with no clear triggering factor, no improving or worsening factors. Her blood pressure was very high. She manage to go to bed and sleep with the aid of her home bipap. This morning when she woke up she had same symptoms, EMS was called. Her 02 saturation was 93% on 4 L.min per [place], received oral prednisone, bronchodilator therapy and was transported to the ED.   At the time of my examination she is feeling better, but not yet back to baseline.        Review of Systems: As mentioned in the history of present illness. All other systems reviewed and are negative. 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   OSTEOARTHRITIS 06/05/2009   PARESTHESIA  08/24/2007   TOBACCO ABUSE 01/25/2010   Past Surgical History:  Procedure Laterality Date   ABDOMINAL HYSTERECTOMY     APPENDECTOMY  1969   CARDIAC CATHETERIZATION N/A 07/05/2015   Procedure: Left Heart Cath and Coronary Angiography;  Surgeon: Marykay Lex, MD;  Location: [facility] INVASIVE [facility];  Service: Cardiovascular;  Laterality: N/A;   CHOLECYSTECTOMY  1989   collapse lung  1990   COLONOSCOPY WITH PROPOFOL N/A 05/14/2018   Procedure: COLONOSCOPY WITH PROPOFOL;  Surgeon: Hilarie Fredrickson, MD;  Location: [facility];  Service: Endoscopy;  Laterality: N/A;   EXCISION MASS ABDOMINAL  2019   EXPLORATORY LAPAROTOMY  1969   LEFT HEART CATH AND CORONARY ANGIOGRAPHY N/A 11/19/2020   Procedure: LEFT HEART CATH AND CORONARY ANGIOGRAPHY;  Surgeon: Yvonne Kendall, MD;  Location: [facility] INVASIVE CV LAB;  Service: Cardiovascular;  Laterality: N/A;   POLYPECTOMY  05/14/2018   Procedure: POLYPECTOMY;  Surgeon: Hilarie Fredrickson, MD;  Location: [facility];  Service: Endoscopy;;   SALPINGOOPHORECTOMY  2019   Social History:  reports that she quit smoking about 8 years ago. Her smoking use included cigarettes. She has a 76.00 pack-year smoking history. She has never used smokeless tobacco. She reports that she does not drink alcohol and does not use drugs.  Allergies  Allergen Reactions   Tape Other (See Comments)    Skin tears easily, peels skin off   Daliresp [Roflumilast] Other (See Comments)    Skin peeling  Cozaar [Losartan] Other (See Comments)    Possible cause of severe hypotension   Dupixent [Dupilumab] Diarrhea, Swelling and Cough    Facial swelling Headache   Latex Other (See Comments)    Peels skin off   Morphine And Codeine Other (See Comments)    Hallucinations    Penicillins Hives   Statins Other (See Comments)    Myalgia    Zestril [Lisinopril] Cough   Omnicef [Cefdinir] Rash    Family History  Problem Relation Age of Onset   Cancer Mother        lung   Heart attack Mother     Hypertension Sister    Multiple sclerosis Brother    Diabetes Sister    Rheum arthritis Sister    Thyroid disease Sister    Multiple sclerosis Other    Alcohol abuse Other    Arthritis Other    Diabetes Other    Kidney disease Other    Cancer Other        lung,ovarian,skin, uterine   Stroke Other    Heart disease Other    Melanoma Other    Osteoporosis Other    Heart attack Maternal Uncle    Heart attack Other        NEICE   Heart attack Other        NEPHEW   Hypertension Brother    Stroke Maternal Grandmother    Colon cancer Neg Hx     Prior to Admission medications   Medication Sig Start Date End Date Taking? Authorizing Provider  albuterol (VENTOLIN HFA) 108 (90 Base) MCG/ACT inhaler INHALE 2 PUFFS INTO THE LUNGS EVERY 4 TO 6 HOURS AS NEEDED FOR SHORTNESS OF BREATH OR WHEEZING Patient taking differently: Inhale 2 puffs into the lungs See admin instructions. Inhale 2 puffs every 4-6 hours as needed for wheezing or shortness or breath. 07/23/22  Yes Cobb, Ruby Cola, NP  ALPRAZolam Prudy Feeler) 0.5 MG tablet Take 1 tablet (0.5 mg total) by mouth every 6 (six) hours. 09/24/22  Yes Nafziger, Kandee Keen, NP  aspirin EC 81 MG EC tablet Take 1 tablet (81 mg total) by mouth daily. 07/08/15  Yes Rhetta Mura, MD  bisoprolol (ZEBETA) 5 MG tablet Take 0.5 tablets (2.5 mg total) by mouth daily as needed (Take HALF tablet by mouth for break through chest pain as needed). Patient taking differently: Take 2.5 mg by mouth daily. 10/03/22  Yes Kathleene Hazel, MD  budesonide-formoterol [facility]) 160-4.5 MCG/ACT inhaler Inhale 2 puffs into the lungs 2 (two) times daily. 07/15/22  Yes Oretha Milch, MD  butalbital-acetaminophen-caffeine (FIORICET) [phone] MG tablet Take 1 tablet by mouth every 4 (four) hours as needed for headache. TAKE 1 TABLET BY MOUTH EVERY 4 HOURS AS NEEDED FOR HEADACHETAKE 1 TABLET BY MOUTH EVERY 4 HOURS AS NEEDED FOR HEADACHE 09/24/22  Yes Nafziger, Kandee Keen, NP   Carboxymethylcellulose Sodium (ARTIFICIAL TEARS OP) Place 1 drop into both eyes 2 (two) times daily as needed (dryness, irritation).   Yes [provider]  cetirizine (ZYRTEC) 10 MG tablet Take 10 mg by mouth daily.   Yes [provider]  Cholecalciferol (VITAMIN D3 PO) Take 5,000 Units by mouth daily.   Yes [provider]  Cyanocobalamin (VITAMIN B-12 PO) Take 1 tablet by mouth daily.   Yes [provider]  ezetimibe (ZETIA) 10 MG tablet TAKE 1 TABLET(10 MG) BY MOUTH DAILY Patient taking differently: Take 10 mg by mouth daily. 01/14/22  Yes Nafziger, Kandee Keen, NP  furosemide (LASIX) 40  MG tablet Take 1 tablet (40 mg total) by mouth daily. Patient taking differently: Take 40 mg by mouth daily as needed for fluid. 04/14/22  Yes Kathleene Hazel, MD  HYDROcodone-acetaminophen (NORCO) 7.5-325 MG tablet Take 1 tablet by mouth every 6 (six) hours as needed for moderate pain. 08/05/22  Yes Nafziger, Kandee Keen, NP  ipratropium-albuterol (DUONEB) 0.5-2.5 (3) MG/3ML SOLN USE 3 ML VIA NEBULIZER EVERY 6 HOURS AS NEEDED Patient taking differently: Take 3 mLs by nebulization every 6 (six) hours as needed (wheezing, shortness of breath). 05/16/22  Yes Oretha Milch, MD  magnesium oxide (MAG-OX) 400 MG tablet Take 400 mg by mouth daily as needed (constipation).   Yes [provider]  MUCINEX MAXIMUM STRENGTH 1200 MG TB12 Take 1,200 mg by mouth every 12 (twelve) hours.   Yes [provider]  nitroGLYCERIN (NITROSTAT) 0.4 MG SL tablet Place 1 tablet (0.4 mg total) under the tongue every 5 (five) minutes as needed for chest pain. 10/24/20  Yes Kathleene Hazel, MD  nystatin (MYCOSTATIN) 100000 UNIT/ML suspension Take 5 mLs (500,000 Units total) by mouth 4 (four) times daily. 09/24/22  Yes Nafziger, Kandee Keen, NP  ondansetron (ZOFRAN) 4 MG tablet Take 1 tablet (4 mg total) by mouth every 8 (eight) hours as needed for nausea or vomiting. 09/05/21  Yes Nafziger, Kandee Keen, NP   OXYGEN Inhale 2-3 L/min into the lungs continuous.   Yes [provider]  PARoxetine (PAXIL) 40 MG tablet TAKE 1 TABLET(40 MG) BY MOUTH EVERY MORNING Patient taking differently: Take 40 mg by mouth daily. 09/30/22  Yes Nafziger, Kandee Keen, NP  polyethylene glycol powder (GLYCOLAX/MIRALAX) 17 GM/SCOOP powder Take 17 g by mouth daily as needed for moderate constipation (constipation).   Yes [provider]  potassium chloride (KLOR-CON) 10 MEQ tablet Take 1 tablet (10 mEq total) by mouth as needed (take with furosemide (lasix)). Patient taking differently: Take 10 mEq by mouth daily as needed (with furosemide.). 06/05/22  Yes Kathleene Hazel, MD  rosuvastatin (CRESTOR) 20 MG tablet Take 1 tablet (20 mg total) by mouth daily. 07/14/22  Yes Kathleene Hazel, MD  spironolactone (ALDACTONE) 25 MG tablet Take 0.5 tablets (12.5 mg total) by mouth daily. 09/19/22 10/19/22 Yes Batya Citron, York Ram, MD  dapagliflozin propanediol (FARXIGA) 10 MG TABS tablet Take 1 tablet (10 mg total) by mouth daily. Patient not taking: Reported on 10/04/2022 09/19/22   Patience Nuzzo, York Ram, MD  predniSONE (DELTASONE) 10 MG tablet TAKE 1 TABLET(10 MG) BY MOUTH DAILY Patient taking differently: Take 10 mg by mouth daily. 05/16/22   Oretha Milch, MD  predniSONE (DELTASONE) 10 MG tablet Take 4 tablets daily for three days, then take 3 tablets daily for three days, then take 2 tablets daily for three days, then take 1 tablet daily for three days, then continue with usual regimen of prednisone. Patient not taking: Reported on 10/04/2022 09/19/22   Yanai Hobson, York Ram, MD  predniSONE (DELTASONE) 5 MG tablet TAKE 1 TABLET(5 MG) BY MOUTH DAILY WITH BREAKFAST 09/08/22   Oretha Milch, MD  Spacer/Aero Chamber Mouthpiece MISC 1 Device by Does not apply route as directed. 01/12/17   Roslynn Amble, MD    Physical Exam: Vitals:   10/04/22 1230 10/04/22 1400 10/04/22 1430 10/04/22 1530  BP:  126/64 129/85  115/81  Pulse: 95 85 89 91  Resp: 20 (!) 25 19 18   SpO2: 97% 95% 96% 96%   Neurology awake and alert ENT with mild pallor Cardiovascular with S1 and  S2 present and rhythmic with no gallops, rubs or murmurs Respiratory with prolonged expiratory phase, bilateral expiratory wheezing and scattered rales and rhonchi Abdomen with no distention  No lower extremity edema  Data Reviewed:   Na 140, K 3,7 Cl 98, bicarbonate 28, glucose 161, bun 27 cr 1,23  BNP 441 High sensitive troponin 37 and 33 Wbc 15. Hgb 14.5 plt 225  Sars covid 19 negative   Chest radiograph with no cardiomegaly, hyperinflation with bibasilar atelectasis, no effusions or infiltrates.   EKG 100 bpm, normal axis, normal intervals, sinus rhythm with left atrial enlargement, no significant ST segment or T wave changes.   67 yo female with extensive COPD with asthma, chronic respiratory failure hypoxemic and hypercapnic on home 02 and home non invasive mechanical ventilation. Recently hospitalization and on prolonged prednisone taper.  Acute onset of symptoms, no improving with steroids and bronchodilator therapy.  Positive signs of air trapping on pulmonary examination.  Chest radiograph with hyperinflation with no infiltrates.   Dx, COPD/ asthma exacerbation.   Assessment and Plan: COPD exacerbation (HCC) Acute on chronic hypoxemic and hypercapnic respiratory failure.   Plan to continue systemic corticosteroids with methylprednisolone IV Bronchodilator therapy and inhaled corticosteroids.  Continue bipap at night.  Keep 02 saturation 88% or greater.  Antibiotic therapy with azithromycin for 5 days.   Chronic systolic CHF (congestive heart failure) (HCC) No signs of acute exacerbation.   Plan to hold on diuretic therapy for now. Continue blood pressure control.  Patient reported side effects from SGLT 2 inh, had a yeast infection.   Essential hypertension Continue blood pressure monitoring.  Continue with  bisprolol.   AKI (acute kidney injury) (HCC) Hold on diuretic therapy for now. Follow up renal function and electrolytes in am.   Anxiety and depression Continue with as needed alprazolam and scheduled paroxetine.    Dyslipidemia Continue with ezetimibe and statin        Advance Care Planning:   Code Status: Full Code   Consults: full  Family Communication: I spoke with patient's husband at the bedside, we talked in detail about patient's condition, plan of care and prognosis and all questions were addressed.   Severity of Illness: The appropriate patient status for this patient is INPATIENT. Inpatient status is judged to be reasonable and necessary in order to provide the required intensity of service to ensure the patient's safety. The patient's presenting symptoms, physical exam findings, and initial radiographic and laboratory data in the context of their chronic comorbidities is felt to place them at high risk for further clinical deterioration. Furthermore, it is not anticipated that the patient will be medically stable for discharge from the hospital within 2 midnights of admission.   * I certify that at the point of admission it is my clinical judgment that the patient will require inpatient hospital care spanning beyond 2 midnights from the point of admission due to high intensity of service, high risk for further deterioration and high frequency of surveillance required.*  Author: Coralie Keens, MD 10/04/2022 3:55 PM  For on call review [email or web address].

## 2022-10-04 NOTE — ED Provider Notes (Signed)
[place] EMERGENCY DEPARTMENT AT [facility] Provider Note   CSN: [id number] Arrival date & time: 10/04/22  1133     History  Chief Complaint  Patient presents with   COPD   Shortness of Breath    Kim [name] is a 67 y.o. female.   COPD Associated symptoms include shortness of breath.  Shortness of Breath Patient shortness of breath and COPD.  Has had for the last few days.  Started on 40 of prednisone at home.  On chronic oxygen.  Occasional cough without much sputum production.  Reportedly just dyspneic to start with but wheezing began after coming to the hospital.  History of somewhat severe COPD at baseline.    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   OSTEOARTHRITIS 06/05/2009   PARESTHESIA 08/24/2007   TOBACCO ABUSE 01/25/2010    Home Medications Prior to Admission medications   Medication Sig Start Date End Date Taking? Authorizing Provider  albuterol (VENTOLIN HFA) 108 (90 Base) MCG/ACT inhaler INHALE 2 PUFFS INTO THE LUNGS EVERY 4 TO 6 HOURS AS NEEDED FOR SHORTNESS OF BREATH OR WHEEZING Patient taking differently: Inhale 2 puffs into the lungs See admin instructions. Inhale 2 puffs every 4-6 hours as needed for wheezing or shortness or breath. 07/23/22   Cobb, Ruby Cola, NP  ALPRAZolam Prudy Feeler) 0.5 MG tablet Take 1 tablet (0.5 mg total) by mouth every 6 (six) hours. 09/24/22   Nafziger, Kandee Keen, NP  aspirin EC 81 MG EC tablet Take 1 tablet (81 mg total) by mouth daily. 07/08/15   Rhetta Mura, MD  bisoprolol (ZEBETA) 5 MG tablet Take 0.5 tablets (2.5 mg total) by mouth daily as needed (Take HALF tablet by mouth for break through chest pain as needed). 10/03/22   Kathleene Hazel, MD  budesonide-formoterol (SYMBICORT) 160-4.5 MCG/ACT inhaler Inhale 2 puffs into the lungs 2 (two) times daily. 07/15/22    Oretha Milch, MD  butalbital-acetaminophen-caffeine (FIORICET) [phone] MG tablet Take 1 tablet by mouth every 4 (four) hours as needed for headache. TAKE 1 TABLET BY MOUTH EVERY 4 HOURS AS NEEDED FOR HEADACHETAKE 1 TABLET BY MOUTH EVERY 4 HOURS AS NEEDED FOR HEADACHE 09/24/22   Nafziger, Kandee Keen, NP  Carboxymethylcellulose Sodium (ARTIFICIAL TEARS OP) Place 1 drop into both eyes 2 (two) times daily as needed (dryness, irritation).    [provider]  cetirizine (ZYRTEC) 10 MG tablet Take 10 mg by mouth daily.    [provider]  Cholecalciferol (VITAMIN D3 PO) Take 5,000 Units by mouth daily.    [provider]  Cyanocobalamin (VITAMIN B-12 PO) Take 1 tablet by mouth daily.    [provider]  dapagliflozin propanediol (FARXIGA) 10 MG TABS tablet Take 1 tablet (10 mg total) by mouth daily. 09/19/22   Arrien, York Ram, MD  ezetimibe (ZETIA) 10 MG tablet TAKE 1 TABLET(10 MG) BY MOUTH DAILY Patient taking differently: Take 10 mg by mouth daily. 01/14/22   Nafziger, Kandee Keen, NP  furosemide (LASIX) 40 MG tablet Take 1 tablet (40 mg total) by mouth daily. Patient taking differently: Take 40 mg by mouth daily as needed for fluid. 04/14/22   Kathleene Hazel, MD  HYDROcodone-acetaminophen (NORCO) 7.5-325 MG tablet Take 1 tablet by mouth every 6 (six) hours as needed for moderate pain. 08/05/22   Nafziger, Kandee Keen, NP  ipratropium-albuterol (DUONEB) 0.5-2.5 (  3) MG/3ML SOLN USE 3 ML VIA NEBULIZER EVERY 6 HOURS AS NEEDED Patient taking differently: Take 3 mLs by nebulization every 6 (six) hours as needed (wheezing, shortness of breath). 05/16/22   Oretha Milch, MD  magnesium oxide (MAG-OX) 400 MG tablet Take 400 mg by mouth daily as needed (constipation).    [provider]  MUCINEX MAXIMUM STRENGTH 1200 MG TB12 Take 1,200 mg by mouth every 12 (twelve) hours.    [provider]  nitroGLYCERIN (NITROSTAT) 0.4 MG SL tablet Place 1 tablet (0.4 mg total)  under the tongue every 5 (five) minutes as needed for chest pain. 10/24/20   Kathleene Hazel, MD  nystatin (MYCOSTATIN) 100000 UNIT/ML suspension Take 5 mLs (500,000 Units total) by mouth 4 (four) times daily. 09/24/22   Nafziger, Kandee Keen, NP  ondansetron (ZOFRAN) 4 MG tablet Take 1 tablet (4 mg total) by mouth every 8 (eight) hours as needed for nausea or vomiting. 09/05/21   Nafziger, Kandee Keen, NP  OXYGEN Inhale 2 L/min into the lungs continuous.    [provider]  PARoxetine (PAXIL) 40 MG tablet TAKE 1 TABLET(40 MG) BY MOUTH EVERY MORNING 09/30/22   Nafziger, Kandee Keen, NP  polyethylene glycol powder (GLYCOLAX/MIRALAX) 17 GM/SCOOP powder Take 17 g by mouth daily as needed (constipation).    [provider]  potassium chloride (KLOR-CON) 10 MEQ tablet Take 1 tablet (10 mEq total) by mouth as needed (take with furosemide (lasix)). Patient taking differently: Take 10 mEq by mouth daily as needed (with furosemide.). 06/05/22   Kathleene Hazel, MD  predniSONE (DELTASONE) 10 MG tablet TAKE 1 TABLET(10 MG) BY MOUTH DAILY Patient taking differently: Take 10 mg by mouth daily. 05/16/22   Oretha Milch, MD  predniSONE (DELTASONE) 10 MG tablet Take 4 tablets daily for three days, then take 3 tablets daily for three days, then take 2 tablets daily for three days, then take 1 tablet daily for three days, then continue with usual regimen of prednisone. 09/19/22   Arrien, York Ram, MD  predniSONE (DELTASONE) 5 MG tablet TAKE 1 TABLET(5 MG) BY MOUTH DAILY WITH BREAKFAST Patient taking differently: Take 5 mg by mouth daily with breakfast. 09/08/22   Oretha Milch, MD  rosuvastatin (CRESTOR) 20 MG tablet Take 1 tablet (20 mg total) by mouth daily. Patient taking differently: Take 20 mg by mouth every evening. 07/14/22   Kathleene Hazel, MD  Spacer/Aero Chamber Mouthpiece MISC 1 Device by Does not apply route as directed. 01/12/17   Roslynn Amble, MD  spironolactone (ALDACTONE) 25 MG  tablet Take 0.5 tablets (12.5 mg total) by mouth daily. 09/19/22 10/19/22  Arrien, York Ram, MD      Allergies    Tape, Daliresp [roflumilast], Cozaar [losartan], Dupixent [dupilumab], Latex, Morphine and codeine, Penicillins, Statins, Zestril [lisinopril], and Omnicef [cefdinir]    Review of Systems   Review of Systems  Respiratory:  Positive for shortness of breath.     Physical Exam Updated Vital Signs BP (!) 136/100   Pulse 95   Resp 20   SpO2 97%  Physical Exam Vitals and nursing note reviewed.  Cardiovascular:     Rate and Rhythm: Normal rate and regular rhythm.  Pulmonary:     Effort: Tachypnea present.     Comments: Harsh breath sounds with mild wheezes. Musculoskeletal:     Right lower leg: No edema.     Left lower leg: No edema.  Neurological:     Mental Status: She is alert.  ED Results / Procedures / Treatments   Labs (all labs ordered are listed, but only abnormal results are displayed) Labs Reviewed  BRAIN NATRIURETIC PEPTIDE - Abnormal; Notable for the following components:      Result Value   B Natriuretic Peptide 441.9 (*)    All other components within normal limits  COMPREHENSIVE METABOLIC PANEL - Abnormal; Notable for the following components:   Glucose, Bld 161 (*)    BUN 27 (*)    Creatinine, Ser 1.23 (*)    Total Protein 6.4 (*)    GFR, Estimated 48 (*)    All other components within normal limits  CBC - Abnormal; Notable for the following components:   WBC 15.3 (*)    HCT 46.4 (*)    All other components within normal limits  TROPONIN I (HIGH SENSITIVITY) - Abnormal; Notable for the following components:   Troponin I (High Sensitivity) 37 (*)    All other components within normal limits  SARS CORONAVIRUS 2 BY RT PCR  TROPONIN I (HIGH SENSITIVITY)    EKG None  Radiology DG Chest Portable 1 View  Result Date: 10/04/2022 CLINICAL DATA:  Shortness of breath EXAM: PORTABLE CHEST 1 VIEW COMPARISON:  Chest x-ray September 10, 2022  FINDINGS: The cardiomediastinal silhouette is unchanged in contour. Calcified atherosclerosis of the aortic arch. Bibasilar bandlike opacities, likely atelectasis. No additional focal pulmonary opacity. No pleural effusion or pneumothorax. The visualized upper abdomen is unremarkable. No acute osseous abnormality. IMPRESSION: Bibasilar bandlike opacities, likely atelectasis. Electronically Signed   By: Jacob Moores M.D.   On: 10/04/2022 13:46    Procedures Procedures    Medications Ordered in ED Medications - No data to display  ED Course/ Medical Decision Making/ A&P                             Medical Decision Making Amount and/or Complexity of Data Reviewed Labs: ordered. Radiology: ordered.  Risk Decision regarding hospitalization.   Patient shortness of breath.  History of COPD.  Does not feel she is critically volume overloaded.  Differential diagnosis includes COPD CHF/pneumonia.  X-ray showed atelectasis.  Blood work overall reassuring.  Troponin mildly elevated but down from prior.  Also BNP mildly elevated but also down from prior.  Reviewed discharge note.  Had breathing treatments by EMS and steroids at home.  Patient still feeling somewhat dyspneic but somewhat improved.  With prickly recent severe COPD and home oxygen I feel he would benefit from mission to the hospital.  Will discuss with hospitalist.         Final Clinical Impression(s) / ED Diagnoses Final diagnoses:  COPD exacerbation [facility])    Rx / DC Orders ED Discharge Orders     None         Benjiman Core, MD 10/04/22 1424

## 2022-10-04 NOTE — ED Notes (Signed)
..ED TO INPATIENT HANDOFF REPORT  ED Nurse Name and Phone #: [phone]  S Name/Age/Gender Kim [name] 67 y.o. female Room/Bed: 018C/018C  Code Status   Code Status: Full Code  Home/SNF/Other Home Patient oriented to: self, place, time, and situation Is this baseline? Yes   Triage Complete: Triage complete  Chief Complaint COPD exacerbation (HCC) [J44.1]  Triage Note Pt to ED via EMS from home with c/o SOB onset today. Per EMS pt stated she was feeling increased SOB today has hx of COPD on 2L [place] at home pt reports she had to increase O2 to 4L. EMS stated pt O2 Sats 93%. Pt reported she did two duonebs and took 4 prednisone today. O2 Sats 98% on 4L [place] in triage, expiratory wheezing noted.   Allergies Allergies  Allergen Reactions   Tape Other (See Comments)    Skin tears easily, peels skin off   Daliresp [Roflumilast] Other (See Comments)    Skin peeling   Cozaar [Losartan] Other (See Comments)    Possible cause of severe hypotension   Dupixent [Dupilumab] Diarrhea, Swelling and Cough    Facial swelling Headache   Latex Other (See Comments)    Peels skin off   Morphine And Codeine Other (See Comments)    Hallucinations    Penicillins Hives   Statins Other (See Comments)    Myalgia    Zestril [Lisinopril] Cough   Omnicef [Cefdinir] Rash    Level of Care/Admitting Diagnosis ED Disposition     ED Disposition  Admit   Condition  --   Comment  Hospital Area: [facility] [[id number]]  Level of Care: Med-Surg [16]  May admit patient to [facility] or [facility] if equivalent level of care is available:: No  Covid Evaluation: Asymptomatic - no recent exposure (last 10 days) testing not required  Diagnosis: COPD exacerbation [facility]) [[id number]]  Admitting Physician: Coralie Keens [[id number]]  Attending Physician: Coralie Keens [[id number]]  Certification:: I certify this patient will need inpatient services for at least 2 midnights   Estimated Length of Stay: 3          B Medical/Surgery History 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   OSTEOARTHRITIS 06/05/2009   PARESTHESIA 08/24/2007   TOBACCO ABUSE 01/25/2010   Past Surgical History:  Procedure Laterality Date   ABDOMINAL HYSTERECTOMY     APPENDECTOMY  1969   CARDIAC CATHETERIZATION N/A 07/05/2015   Procedure: Left Heart Cath and Coronary Angiography;  Surgeon: Marykay Lex, MD;  Location: [facility] INVASIVE CV LAB;  Service: Cardiovascular;  Laterality: N/A;   CHOLECYSTECTOMY  1989   collapse lung  1990   COLONOSCOPY WITH PROPOFOL N/A 05/14/2018   Procedure: COLONOSCOPY WITH PROPOFOL;  Surgeon: Hilarie Fredrickson, MD;  Location: [facility] ENDOSCOPY;  Service: Endoscopy;  Laterality: N/A;   EXCISION MASS ABDOMINAL  2019   EXPLORATORY LAPAROTOMY  1969   LEFT HEART CATH AND CORONARY ANGIOGRAPHY N/A 11/19/2020   Procedure: LEFT HEART CATH AND CORONARY ANGIOGRAPHY;  Surgeon: Yvonne Kendall, MD;  Location: [facility] INVASIVE CV LAB;  Service: Cardiovascular;  Laterality: N/A;   POLYPECTOMY  05/14/2018   Procedure: POLYPECTOMY;  Surgeon: Hilarie Fredrickson, MD;  Location: [facility] ENDOSCOPY;  Service: Endoscopy;;   SALPINGOOPHORECTOMY  2019     A IV Location/Drains/Wounds Patient Lines/Drains/Airways Status     Active  Line/Drains/Airways     Name Placement date Placement time Site Days   Peripheral IV 09/10/22 22 G Left;Posterior Forearm 09/10/22  1818  Forearm  24   External Urinary Catheter 09/16/22  1755  --  18   Wound / Incision (Open or Dehisced) 09/10/22 Skin tear Arm Left;Lower;Posterior;Proximal 09/10/22  1530  Arm  24   Wound / Incision (Open or Dehisced) 09/10/22 Skin tear Arm Lower;Posterior;Proximal;Right small skin tear, left elbow 09/10/22  1817  Arm  24            Intake/Output Last 24 hours No intake or output data in  the 24 hours ending 10/04/22 1522  Labs/Imaging Results for orders placed or performed during the hospital encounter of 10/04/22 (from the past 48 hour(s))  SARS Coronavirus 2 by RT PCR (hospital order, performed in [facility] hospital lab) *cepheid single result test* Anterior Nasal Swab     Status: None   Collection Time: 10/04/22 12:00 PM   Specimen: Anterior Nasal Swab  Result Value Ref Range   SARS Coronavirus 2 by RT PCR NEGATIVE NEGATIVE    Comment: Performed at [facility], [address] [place], [place] [address]  Brain natriuretic peptide     Status: Abnormal   Collection Time: 10/04/22 12:00 PM  Result Value Ref Range   B Natriuretic Peptide 441.9 (H) 0.0 - 100.0 pg/mL    Comment: Performed at [facility], [address], [place], [place] [address]  Comprehensive metabolic panel     Status: Abnormal   Collection Time: 10/04/22 12:00 PM  Result Value Ref Range   Sodium 140 135 - 145 mmol/L   Potassium 3.7 3.5 - 5.1 mmol/L   Chloride 98 98 - 111 mmol/L   CO2 28 22 - 32 mmol/L   Glucose, Bld 161 (H) 70 - 99 mg/dL    Comment: Glucose reference range applies only to samples taken after fasting for at least 8 hours.   BUN 27 (H) 8 - 23 mg/dL   Creatinine, Ser 5.36 (H) 0.44 - 1.00 mg/dL   Calcium 64.4 8.9 - 03.4 mg/dL   Total Protein 6.4 (L) 6.5 - 8.1 g/dL   Albumin 3.6 3.5 - 5.0 g/dL   AST 23 15 - 41 U/L   ALT 29 0 - 44 U/L   Alkaline Phosphatase 69 38 - 126 U/L   Total Bilirubin 0.8 0.3 - 1.2 mg/dL   GFR, Estimated 48 (L) >60 mL/min    Comment: (NOTE) Calculated using the CKD-EPI Creatinine Equation (2021)    Anion gap 14 5 - 15    Comment: Performed at [facility], [address], [place], [place] [address]  CBC     Status: Abnormal   Collection Time: 10/04/22 12:00 PM  Result Value Ref Range   WBC 15.3 (H) 4.0 - 10.5 K/uL   RBC 4.67 3.87 - 5.11 MIL/uL   Hemoglobin 14.5 12.0 - 15.0 g/dL   HCT 56.3 (H) 87.5 - 64.3 %   MCV 99.4 80.0 - 100.0  fL   MCH 31.0 26.0 - 34.0 pg   MCHC 31.3 30.0 - 36.0 g/dL   RDW 32.9 51.8 - 84.1 %   Platelets 225 150 - 400 K/uL   nRBC 0.0 0.0 - 0.2 %    Comment: Performed at [facility], [address] [place], [place] [address]  Troponin I (High Sensitivity)     Status: Abnormal   Collection Time: 10/04/22 12:00  PM  Result Value Ref Range   Troponin I (High Sensitivity) 37 (H) <18 ng/L    Comment: (NOTE) Elevated high sensitivity troponin I (hsTnI) values and significant  changes across serial measurements may suggest ACS but many other  chronic and acute conditions are known to elevate hsTnI results.  Refer to the "Links" section for chest pain algorithms and additional  guidance. Performed at [facility], [address], [place], [place] [address]    DG Chest Portable 1 View  Result Date: 10/04/2022 CLINICAL DATA:  Shortness of breath EXAM: PORTABLE CHEST 1 VIEW COMPARISON:  Chest x-ray September 10, 2022 FINDINGS: The cardiomediastinal silhouette is unchanged in contour. Calcified atherosclerosis of the aortic arch. Bibasilar bandlike opacities, likely atelectasis. No additional focal pulmonary opacity. No pleural effusion or pneumothorax. The visualized upper abdomen is unremarkable. No acute osseous abnormality. IMPRESSION: Bibasilar bandlike opacities, likely atelectasis. Electronically Signed   By: Jacob Moores M.D.   On: 10/04/2022 13:46    Pending Labs [employer] (From admission, onward)     Start     Ordered   Signed and Held  CBC  (enoxaparin (LOVENOX)    CrCl >/= 30 ml/min)  Once,   R       Comments: Baseline for enoxaparin therapy IF NOT ALREADY DRAWN.  Notify MD if PLT < 100 K.    Signed and Held   Signed and Held  Creatinine, serum  (enoxaparin (LOVENOX)    CrCl >/= 30 ml/min)  Once,   R       Comments: Baseline for enoxaparin therapy IF NOT ALREADY DRAWN.    Signed and Held   Signed and Held  Creatinine, serum  (enoxaparin (LOVENOX)    CrCl >/= 30 ml/min)   Weekly,   R     Comments: while on enoxaparin therapy    Signed and Held   Signed and Held  Basic metabolic panel  Tomorrow morning,   R        Signed and Held   Signed and Held  CBC  Tomorrow morning,   R        Signed and Held            Vitals/Pain Today's Vitals   10/04/22 1147 10/04/22 1149 10/04/22 1215 10/04/22 1230  BP:   (!) 136/100   Pulse: 99  95 95  Resp: (!) 25  (!) 24 20  SpO2: 98% 99% 98% 97%    Isolation Precautions No active isolations  Medications Medications - No data to display  Mobility walks     Focused Assessments Cardiac Assessment Handoff:    Lab Results  Component Value Date   TROPONINI 0.57 (HH) 07/06/2015   Lab Results  Component Value Date   DDIMER 0.43 06/26/2021   Does the Patient currently have chest pain? No   , Neuro Assessment Handoff:  Swallow screen pass? Yes          Neuro Assessment:   Neuro Checks:      Has TPA been given? No If patient is a Neuro Trauma and patient is going to OR before floor call report to 4N Charge nurse: [phone] or [phone]   R Recommendations: See Admitting Provider Note  Report given to:   Additional Notes: Pt Aox4, ambulatory, COPD exacerbation. 2L [place] baseline O2 sat 98%. Denies CP or SOB at this time

## 2022-10-04 NOTE — Assessment & Plan Note (Signed)
Hold on diuretic therapy for now. Follow up renal function and electrolytes in am.

## 2022-10-04 NOTE — ED Triage Notes (Signed)
Pt to ED via EMS from home with c/o SOB onset today. Per EMS pt stated she was feeling increased SOB today has hx of COPD on 2L [place] at home pt reports she had to increase O2 to 4L. EMS stated pt O2 Sats 93%. Pt reported she did two duonebs and took 4 prednisone today. O2 Sats 98% on 4L [place] in triage, expiratory wheezing noted.

## 2022-10-04 NOTE — Assessment & Plan Note (Addendum)
Continue with as needed alprazolam and scheduled paroxetine.

## 2022-10-04 NOTE — Assessment & Plan Note (Addendum)
Continue blood pressure monitoring.  Continue with bisprolol.

## 2022-10-04 NOTE — Assessment & Plan Note (Signed)
No signs of acute exacerbation.   Plan to hold on diuretic therapy for now. Continue blood pressure control.  Patient reported side effects from SGLT 2 inh, had a yeast infection.

## 2022-10-04 NOTE — Assessment & Plan Note (Addendum)
Continue with ezetimibe and rosuvastatin.  ?

## 2022-10-05 DIAGNOSIS — J441 Chronic obstructive pulmonary disease with (acute) exacerbation: Secondary | ICD-10-CM | POA: Diagnosis not present

## 2022-10-05 LAB — CBC
HCT: 41.6 % (ref 36.0–46.0)
Hemoglobin: 13 g/dL (ref 12.0–15.0)
MCH: 30.4 pg (ref 26.0–34.0)
MCHC: 31.3 g/dL (ref 30.0–36.0)
MCV: 97.4 fL (ref 80.0–100.0)
Platelets: 220 10*3/uL (ref 150–400)
RBC: 4.27 MIL/uL (ref 3.87–5.11)
RDW: 13.8 % (ref 11.5–15.5)
WBC: 11.3 10*3/uL — ABNORMAL HIGH (ref 4.0–10.5)
nRBC: 0 % (ref 0.0–0.2)

## 2022-10-05 LAB — BASIC METABOLIC PANEL
Anion gap: 12 (ref 5–15)
BUN: 27 mg/dL — ABNORMAL HIGH (ref 8–23)
CO2: 29 mmol/L (ref 22–32)
Calcium: 10.6 mg/dL — ABNORMAL HIGH (ref 8.9–10.3)
Chloride: 97 mmol/L — ABNORMAL LOW (ref 98–111)
Creatinine, Ser: 1.27 mg/dL — ABNORMAL HIGH (ref 0.44–1.00)
GFR, Estimated: 46 mL/min — ABNORMAL LOW (ref >60)
Glucose, Bld: 222 mg/dL — ABNORMAL HIGH (ref 70–99)
Potassium: 3.8 mmol/L (ref 3.5–5.1)
Sodium: 138 mmol/L (ref 135–145)

## 2022-10-05 MED ORDER — ALBUTEROL SULFATE (2.5 MG/3ML) 0.083% IN NEBU: 2.5000 mg | INHALATION_SOLUTION | RESPIRATORY_TRACT | Status: DC | PRN

## 2022-10-05 NOTE — Progress Notes (Signed)
   10/04/22 2312  BiPAP/CPAP/SIPAP  $ Non-Invasive Home Ventilator  Initial  BiPAP/CPAP/SIPAP Pt Type Adult  BiPAP/CPAP/SIPAP DREAMSTATIOND  Mask Type Full face mask  Mask Size Small  Respiratory Rate 17 breaths/min  IPAP 8 cmH20  EPAP 5 cmH2O  FiO2 (%) 28 %  Flow Rate 2 lpm  Patient Home Equipment No  Auto Titrate No

## 2022-10-05 NOTE — Progress Notes (Signed)
Progress Note   Patient: Kim [name]:[id number] DOB: Feb 18, 1956 DOA: 10/04/2022     1 DOS: the patient was seen and examined on 10/05/2022   Brief hospital course: 67yo female with h/o COPD on nocturnal BIPAP, SAH, HTN, CAD, and depression who presented on 6/29 with SOB.  She was previously admitted with COPD exacerbation from 6/5-13 and discharged on a slow prednisone taper.  Her home AC broke in the interim and she was exposed to high temperatures.  She was given steroids and bronchodilators.  Assessment and Plan: COPD exacerbation (HCC) Patient's shortness of breath and productive cough are most likely caused by acute COPD exacerbation - possibly related to recent air conditioning failure (now has 2 window units)  She has history of O2-dependent COPD, currently on her usual 2L [place] O2 She does not have fever and has mild leukocytosis Chest x-ray is not consistent with pneumonia Will admit patient - with her failure of outpatient therapy and recurrent need for hospitalization, it seems likely that she will need several days of hospitalization to show sufficient improvement for discharge. Nebulizers: scheduled Duoneb and prn albuterol Solu-Medrol 40 mg IV BID -> Prednisone 40 mg PO daily PO Azithromycin  Continue Symbicort (Dulera per formulary) Coordinated care with [facility] team/PT/OT/Nutrition/RT consults Continue bipap at night Keep 02 saturation 88% or greater  Chronic systolic CHF (congestive heart failure) (HCC) Echo earlier this month with EF 35-40% and grade 1 diastolic dysfunction Appears compensated Hold Lasix for now Patient reported side effects from SGLT 2 inh, had a yeast infection Continue ASA  Essential hypertension Continue bisoprolol, spironolactone  Stage 3a chronic kidney disease (CKD) (HCC) Appears to be stable at this time Attempt to avoid nephrotoxic medications including diuretics for now Recheck BMP in AM   Anxiety and depression Continue with as  needed alprazolam and scheduled paroxetine  Dyslipidemia Continue with ezetimibe and rosuvastatin       Subjective: She reports some improvement in breathing but ongoing cough and SOB, does not feel ready for dc.  Wears 2L [place] O2 at baseline.  Physical Exam: Vitals:   10/05/22 0238 10/05/22 0400 10/05/22 0831 10/05/22 0832  BP:  109/61    Pulse:  83    Resp:  20    Temp:  98 F (36.7 C)    TempSrc:  Oral    SpO2: 96% 95% 96% 97%   General:  Appears calm and comfortable and is in NAD, on home [place] O2 Eyes:  EOMI, normal lids, iris ENT:  grossly normal hearing, lips & tongue, mmm Neck:  no LAD, masses or thyromegaly Cardiovascular:  RRR, no m/r/g. No LE edema.  Respiratory:   Scattered wheezing, moderately poor air movement.  Normal respiratory effort. Abdomen:  soft, NT, ND Skin:  no rash or induration seen on limited exam Musculoskeletal:  grossly normal tone BUE/BLE, good ROM, no bony abnormality Psychiatric:  grossly normal mood and affect, speech fluent and appropriate, AOx3 Neurologic:  CN 2-12 grossly intact, moves all extremities in coordinated fashion   Radiological Exams on Admission: Independently reviewed - see discussion in A/P where applicable  DG Chest Portable 1 View  Result Date: 10/04/2022 CLINICAL DATA:  Shortness of breath EXAM: PORTABLE CHEST 1 VIEW COMPARISON:  Chest x-ray September 10, 2022 FINDINGS: The cardiomediastinal silhouette is unchanged in contour. Calcified atherosclerosis of the aortic arch. Bibasilar bandlike opacities, likely atelectasis. No additional focal pulmonary opacity. No pleural effusion or pneumothorax. The visualized upper abdomen is unremarkable. No acute osseous abnormality. IMPRESSION:  Bibasilar bandlike opacities, likely atelectasis. Electronically Signed   By: Jacob Moores M.D.   On: 10/04/2022 13:46     Pertinent labs:    Glucose 222 BUN 27/Creatinine 1.27/GFR 46 - stable WBC 11.3 COVID negative   Family Communication:  None present; she declined to have me call her husband at the time of admission  Disposition: Status is: Inpatient Remains inpatient appropriate because: persistent COPD exacerbation  Planned Discharge Destination: Home    Time spent: 50 minutes  Author: Jonah Blue, MD 10/05/2022 12:25 PM  For on call review [email or web address].

## 2022-10-05 NOTE — Hospital Course (Signed)
67yo female with h/o COPD on nocturnal BIPAP, SAH, HTN, CAD, and depression who presented on 6/29 with SOB.  She was previously admitted with COPD exacerbation from 6/5-13 and discharged on a slow prednisone taper.  Her home AC broke in the interim and she was exposed to high temperatures.  She was given steroids and bronchodilators.

## 2022-10-06 ENCOUNTER — Telehealth: Payer: Self-pay | Admitting: Pulmonary Disease

## 2022-10-06 DIAGNOSIS — J441 Chronic obstructive pulmonary disease with (acute) exacerbation: Secondary | ICD-10-CM | POA: Diagnosis not present

## 2022-10-06 DIAGNOSIS — I5022 Chronic systolic (congestive) heart failure: Secondary | ICD-10-CM | POA: Diagnosis not present

## 2022-10-06 DIAGNOSIS — I10 Essential (primary) hypertension: Secondary | ICD-10-CM | POA: Diagnosis not present

## 2022-10-06 DIAGNOSIS — F419 Anxiety disorder, unspecified: Secondary | ICD-10-CM | POA: Diagnosis not present

## 2022-10-06 LAB — CBC WITH DIFFERENTIAL/PLATELET
Abs Immature Granulocytes: 0.42 10*3/uL — ABNORMAL HIGH (ref 0.00–0.07)
Basophils Absolute: 0 10*3/uL (ref 0.0–0.1)
Basophils Relative: 0 %
Eosinophils Absolute: 0 10*3/uL (ref 0.0–0.5)
Eosinophils Relative: 0 %
HCT: 36.7 % (ref 36.0–46.0)
Hemoglobin: 11.9 g/dL — ABNORMAL LOW (ref 12.0–15.0)
Immature Granulocytes: 2 %
Lymphocytes Relative: 7 %
Lymphs Abs: 1.4 10*3/uL (ref 0.7–4.0)
MCH: 31.1 pg (ref 26.0–34.0)
MCHC: 32.4 g/dL (ref 30.0–36.0)
MCV: 95.8 fL (ref 80.0–100.0)
Monocytes Absolute: 1.2 10*3/uL — ABNORMAL HIGH (ref 0.1–1.0)
Monocytes Relative: 6 %
Neutro Abs: 16.5 10*3/uL — ABNORMAL HIGH (ref 1.7–7.7)
Neutrophils Relative %: 85 %
Platelets: 236 10*3/uL (ref 150–400)
RBC: 3.83 MIL/uL — ABNORMAL LOW (ref 3.87–5.11)
RDW: 13.4 % (ref 11.5–15.5)
WBC: 19.6 10*3/uL — ABNORMAL HIGH (ref 4.0–10.5)
nRBC: 0 % (ref 0.0–0.2)

## 2022-10-06 LAB — BASIC METABOLIC PANEL
Anion gap: 11 (ref 5–15)
BUN: 28 mg/dL — ABNORMAL HIGH (ref 8–23)
CO2: 28 mmol/L (ref 22–32)
Calcium: 9.9 mg/dL (ref 8.9–10.3)
Chloride: 99 mmol/L (ref 98–111)
Creatinine, Ser: 1.26 mg/dL — ABNORMAL HIGH (ref 0.44–1.00)
GFR, Estimated: 47 mL/min — ABNORMAL LOW (ref >60)
Glucose, Bld: 138 mg/dL — ABNORMAL HIGH (ref 70–99)
Potassium: 4 mmol/L (ref 3.5–5.1)
Sodium: 138 mmol/L (ref 135–145)

## 2022-10-06 MED ORDER — DAPAGLIFLOZIN PROPANEDIOL 10 MG PO TABS
10.0000 mg | ORAL_TABLET | Freq: Every day | ORAL | Status: DC
  Filled 2022-10-06 (×2): qty 1

## 2022-10-06 MED ORDER — POTASSIUM CHLORIDE CRYS ER 20 MEQ PO TBCR
20.0000 meq | EXTENDED_RELEASE_TABLET | Freq: Once | ORAL | Status: AC
  Administered 2022-10-06: 20 meq via ORAL
  Filled 2022-10-06: qty 1

## 2022-10-06 MED ORDER — ARFORMOTEROL TARTRATE 15 MCG/2ML IN NEBU
15.0000 ug | INHALATION_SOLUTION | Freq: Two times a day (BID) | RESPIRATORY_TRACT | Status: DC
  Administered 2022-10-06 – 2022-10-07 (×3): 15 ug via RESPIRATORY_TRACT
  Filled 2022-10-06 (×3): qty 2

## 2022-10-06 MED ORDER — REVEFENACIN 175 MCG/3ML IN SOLN
175.0000 ug | Freq: Every day | RESPIRATORY_TRACT | Status: DC
  Administered 2022-10-06 – 2022-10-07 (×2): 175 ug via RESPIRATORY_TRACT
  Filled 2022-10-06 (×2): qty 3

## 2022-10-06 MED ORDER — IPRATROPIUM-ALBUTEROL 0.5-2.5 (3) MG/3ML IN SOLN: 3.0000 mL | Freq: Three times a day (TID) | RESPIRATORY_TRACT | Status: DC

## 2022-10-06 MED ORDER — FUROSEMIDE 10 MG/ML IJ SOLN
40.0000 mg | Freq: Once | INTRAMUSCULAR | Status: AC
  Administered 2022-10-06: 40 mg via INTRAVENOUS
  Filled 2022-10-06: qty 4

## 2022-10-06 MED ORDER — SPIRONOLACTONE 12.5 MG HALF TABLET
12.5000 mg | ORAL_TABLET | Freq: Every day | ORAL | Status: DC
  Administered 2022-10-07: 12.5 mg via ORAL
  Filled 2022-10-06: qty 1

## 2022-10-06 MED ORDER — BUDESONIDE 0.25 MG/2ML IN SUSP
0.2500 mg | Freq: Two times a day (BID) | RESPIRATORY_TRACT | Status: DC
  Administered 2022-10-06 – 2022-10-07 (×3): 0.25 mg via RESPIRATORY_TRACT
  Filled 2022-10-06 (×3): qty 2

## 2022-10-06 MED ORDER — PREDNISONE 20 MG PO TABS
40.0000 mg | ORAL_TABLET | Freq: Every day | ORAL | Status: DC
  Administered 2022-10-06 – 2022-10-07 (×2): 40 mg via ORAL
  Filled 2022-10-06 (×2): qty 2

## 2022-10-06 MED ORDER — ENOXAPARIN SODIUM 40 MG/0.4ML IJ SOSY: 40.0000 mg | PREFILLED_SYRINGE | INTRAMUSCULAR | Status: DC

## 2022-10-06 NOTE — Progress Notes (Signed)
Occupational Therapy Evaluation Patient Details Name: Kim [name] MRN: [id number] DOB: 10/12/1955 Today's Date: 10/06/2022   History of Present Illness Patient is a 67 yo woman that presents to the hospital for COPD exacerbation. Of note, patient was admitted 6/5-6/13 for COPD exacerbation. PMHx: COPD, CAD,SAH, depression, chronic systoloc CHF, CKD stage 3, DLD, anxiety   Clinical Impression   Patient lives at home with her husband in a above garage apartment and reports that she requires assist for ADLs due to fatigue and COPD and completes functional mobility using rollator in home and transport chair in community.  Her husband completes all IADLs. Of note. Patient recently D/C from hospital for COPD exacerbation and patient is noted to be deconditioned and weak.  Patient requires mod A for LB dressing and min A for supine <>sit. Patient completes functional mobility using RW and min guard with increased time.  Patient would beneift from futher OT intervention to address functional deficits of strength, endurance, ADL independence and AE and EC/WS techniques and education.     Recommendations for follow up therapy are one component of a multi-disciplinary discharge planning process, led by the attending physician.  Recommendations may be updated based on patient status, additional functional criteria and insurance authorization.   Assistance Recommended at Discharge Frequent or constant Supervision/Assistance  Patient can return home with the following A lot of help with bathing/dressing/bathroom;Help with stairs or ramp for entrance;Assist for transportation;Direct supervision/assist for financial management;Direct supervision/assist for medications management;Assistance with cooking/housework    Functional Status Assessment  Patient has had a recent decline in their functional status and demonstrates the ability to make significant improvements in function in a reasonable and predictable  amount of time.  Equipment Recommendations  Tub/shower seat    Recommendations for Other Services       Precautions / Restrictions Precautions Precautions: Fall Precaution Comments:  (Patient reports multiple syncopal episodes at home resulting in falls) Restrictions Weight Bearing Restrictions: No      Mobility Bed Mobility Overal bed mobility: Modified Independent (HOb elevated) Bed Mobility: Supine to Sit, Sit to Supine     Supine to sit: Modified independent (Device/Increase time) Sit to supine: Min assist, HOB elevated (Assist needed to lift LEs into bed)   General bed mobility comments: Assist for safety and lines coming to sitting. Assist to bring legs back into bed returning to supine    Transfers   Equipment used: Rolling walker (2 wheels) Transfers: Sit to/from Stand Sit to Stand: Min guard Stand pivot transfers: Min guard                Balance Overall balance assessment: Needs assistance Sitting-balance support: Feet supported       Standing balance support: Bilateral upper extremity supported, Reliant on assistive device for balance                               ADL either performed or assessed with clinical judgement   ADL Overall ADL's : Needs assistance/impaired (patient reports that amount of assist that she needs varies day by day pending her ability to breathe) Eating/Feeding: Independent   Grooming: Wash/dry face;Standing;Min guard   Upper Body Bathing: Sitting;Minimal assistance;Set up (increased time 2/2 fatigue)   Lower Body Bathing: Moderate assistance   Upper Body Dressing : Set up;Sitting   Lower Body Dressing: Moderate assistance (sitting; to thread LEs into garment)   Toilet Transfer: Min guard;Cueing for safety (increased time, slow  pace)   Psychiatrist and Hygiene: Min guard       Functional mobility during ADLs: Min guard;Rolling walker (2 wheels)       Vision Baseline  Vision/History: 4 Cataracts Patient Visual Report: No change from baseline       Perception     Praxis      Pertinent Vitals/Pain Pain Assessment Pain Assessment: 0-10     Hand Dominance Right   Extremity/Trunk Assessment Upper Extremity Assessment Upper Extremity Assessment: Generalized weakness       Cervical / Trunk Assessment Cervical / Trunk Assessment: Normal   Communication Communication Communication: No difficulties   Cognition Arousal/Alertness: Awake/alert Behavior During Therapy: Anxious Overall Cognitive Status: Within Functional Limits for tasks assessed                                 General Comments: Pt requesting xanax prior to ambulation     General Comments       Exercises     Shoulder Instructions      Home Living Family/patient expects to be discharged to:: Private residence Living Arrangements: Spouse/significant other Available Help at Discharge: Family;Available 24 hours/day Type of Home: Apartment Home Access: Stairs to enter Entrance Stairs-Number of Steps: 14 Entrance Stairs-Rails: Left Home Layout: One level     Bathroom Shower/Tub: Chief Strategy Officer: Standard     Home Equipment: Agricultural consultant (2 wheels);Rollator (4 wheels);Transport chair (neighbor has transport chair)          Prior Functioning/Environment Prior Level of Function : Independent/Modified Independent             Mobility Comments: Household distance ambulation. Limited by SOB.          OT Problem List: Decreased strength;Decreased activity tolerance;Impaired balance (sitting and/or standing)      OT Treatment/Interventions: Self-care/ADL training;Therapeutic exercise;Energy conservation;DME and/or AE instruction;Therapeutic activities;Patient/family education    OT Goals(Current goals can be found in the care plan section) Acute Rehab OT Goals OT Goal Formulation: With patient Time For Goal Achievement:  10/20/22 Potential to Achieve Goals: Good  OT Frequency: Min 2X/week    Co-evaluation              AM-PAC OT "6 Clicks" Daily Activity     Outcome Measure Help from another person eating meals?: None   Help from another person toileting, which includes using toliet, bedpan, or urinal?: A Little Help from another person bathing (including washing, rinsing, drying)?: A Lot Help from another person to put on and taking off regular upper body clothing?: A Little Help from another person to put on and taking off regular lower body clothing?: A Lot 6 Click Score: 14   End of Session Equipment Utilized During Treatment: Gait belt;Rolling walker (2 wheels);Oxygen Nurse Communication: Mobility status  Activity Tolerance: Patient limited by fatigue Patient left: in bed;with call bell/phone within reach;with bed alarm set  OT Visit Diagnosis: Unsteadiness on feet (R26.81);History of falling (Z91.81);Muscle weakness (generalized) (M62.81)                Time: 7829-5621 OT Time Calculation (min): 40 min Charges:  OT Evaluation $OT Eval Moderate Complexity: 1 Mod OT Treatments $Self Care/Home Management : 23-37 mins Governor Specking OT/L  Denice Paradise 10/06/2022, 3:27 PM

## 2022-10-06 NOTE — Telephone Encounter (Signed)
Routing to Dr. Alva as an FYI. 

## 2022-10-06 NOTE — TOC Initial Note (Signed)
Transition of Care [facility]) - Initial/Assessment Note    Patient Details  Name: Kim [name] MRN: [id number] Date of Birth: 1956/03/22  Transition of Care [facility]) CM/SW Contact:    Gordy Clement, RN Phone Number: 10/06/2022, 11:42 AM  Clinical Narrative:        CM met with Patient bedside to complete initial assessment.  Patient is from home with Husband who is a very supportive caregiver- Patient requires assistance with ADL's etc. Patient has a rollator and a BSC in the home. Patient expressed interest in a shower chair.  CM explained that Medicare will not pay for anything in the bathroom and would be happy to get a price for her to pay out of pocket. Patient declined stating she will look at [facility] or possibly look online.  Patient is on 2L of O2 at baseline , provided by [employer]. Patient states she will turn it up to 3L when walking or maybe 4L when bathing. Husband will bring portable O2 for dc and transportation to home.   Patient had a recent hospitalization 6/5-6/13. She was recommended outpatient therapy at  that time at the [facility] location.  She has declined PT/OT evals during this hospitalization and would like to go to the [place] location for previously planned OP PT /OT. She still has her information from previous DC.   TOC will continue to follow patient for any additional discharge needs           Expected Discharge Plan: OP Rehab Barriers to Discharge: Continued Medical Work up   Patient Goals and CMS Choice Patient states their goals for this hospitalization and ongoing recovery are:: to make sure I can breathe CMS Medicare.gov Compare Post Acute Care list provided to:: Other (Comment Required) (N/A   Patient already arranged for OPPT and OT at [facility] from last hospitalization. Would like to keep the same plan) Choice offered to / list presented to : NA      Expected Discharge Plan and Services In-house Referral: NA Discharge Planning Services: CM  Consult Post Acute Care Choice: NA Living arrangements for the past 2 months: Apartment (garage apartment)                 DME Arranged: N/A DME Agency: [employer] (O2 provided by [employer])       HH Arranged: NA HH Agency: NA (Pateint will go to OP Rehab at [facility] per previous DC plan)        Prior Living Arrangements/Services Living arrangements for the past 2 months: Apartment (garage apartment) Lives with:: Spouse Patient language and need for interpreter reviewed:: Yes Do you feel safe going back to the place where you live?: Yes      Need for Family Participation in Patient Care: Yes (Comment) Care giver support system in place?: Yes (comment) Current home services: DME (Patient owns a rollator and BSC and home O2 provided by [employer]) Criminal Activity/Legal Involvement Pertinent to Current Situation/Hospitalization: No - Comment as needed  Activities of Daily Living Home Assistive Devices/Equipment: Walker (specify type) (rollator) ADL Screening (condition at time of admission) Patient's cognitive ability adequate to safely complete daily activities?: Yes Is the patient deaf or have difficulty hearing?: No Does the patient have difficulty seeing, even when wearing glasses/contacts?: No Does the patient have difficulty concentrating, remembering, or making decisions?: No Patient able to express need for assistance with ADLs?: Yes Does the patient have difficulty dressing or bathing?: Yes Independently performs ADLs?: No Communication: Independent with device (comment) Dressing (OT):  Needs assistance Grooming: Independent Feeding: Independent Bathing: Independent Toileting: Independent In/Out Bed: Needs assistance Is this a change from baseline?: Pre-admission baseline Walks in Home: Independent with device (comment) Does the patient have difficulty walking or climbing stairs?: No Weakness of Legs: None Weakness of Arms/Hands: None  Permission  Sought/Granted Permission sought to share information with : Case Manager Permission granted to share information with : Yes, Verbal Permission Granted        Permission granted to share info w Relationship: CM     Emotional Assessment Appearance:: Appears stated age Attitude/Demeanor/Rapport: Gracious, Engaged Affect (typically observed): Accepting, Anxious Orientation: : Oriented to Self, Oriented to Place, Oriented to  Time, Oriented to Situation Alcohol / Substance Use: Not Applicable Psych Involvement: No (comment)  Admission diagnosis:  COPD exacerbation (HCC) [J44.1] Patient Active Problem List   Diagnosis Date Noted   COPD exacerbation (HCC) 09/11/2022   Acute on chronic respiratory failure with hypoxia and hypercapnia (HCC) 09/10/2022   Myalgia due to statin 06/05/2022   Medication reaction 02/12/2022   NSVT (nonsustained ventricular tachycardia) (HCC) 10/11/2021   Hyperkalemia 09/22/2021   SOB (shortness of breath) 09/19/2021   Chronic respiratory failure with hypoxia and hypercapnia (HCC)    DNR (do not resuscitate) 09/17/2021   Double vision 08/26/2021   Stage 3a chronic kidney disease (CKD) (HCC) 08/26/2021   Postconcussive syndrome 08/26/2021   Elevated troponin 08/26/2021   Subarachnoid hemorrhage (HCC) 08/26/2021   Syncope and collapse 08/26/2021   NSTEMI (non-ST elevated myocardial infarction) (HCC) 11/17/2020   Osteoporosis 06/05/2020   Chronic systolic CHF (congestive heart failure) (HCC) 03/24/2020   Essential hypertension 03/24/2020   Vertebral fracture, osteoporotic (HCC) 03/05/2020   History of colonic polyps    Benign neoplasm of ascending colon    Neuritis 01/29/2017   Alpha-1-antitrypsin deficiency carrier 01/19/2017   Dependent edema 01/14/2017   Orthostatic hypotension    Allergic rhinitis 08/10/2015   Hyperglycemia    Coronary artery calcification seen on CAT scan 07/02/2015   Solitary pulmonary nodule 12/02/2013   Loss of weight  11/10/2013   Osteoarthritis 06/05/2009   Brachial neuritis or radiculitis 06/05/2009   Dyslipidemia 03/12/2009   NEPHROLITHIASIS 01/05/2008   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   PARESTHESIA 08/24/2007   Anxiety and depression 11/19/2006   PCP:  Shirline Frees, NP Pharmacy:   [id number] [place], [place] - [address] AT [facility] [address] [facility] [address] [place] [address] Phone: [phone] Fax: [phone]     Social Determinants of Health (SDOH) Social History: SDOH Screenings   Food Insecurity: Patient Declined (10/04/2022)  Housing: Patient Declined (10/04/2022)  Transportation Needs: Unknown (10/04/2022)  Utilities: Patient Declined (10/04/2022)  Alcohol Screen: Low Risk  (03/22/2020)  Depression (PHQ2-9): Medium Risk (09/10/2021)  Financial Resource Strain: High Risk (09/10/2021)  Physical Activity: Inactive (03/25/2021)  Social Connections: Moderately Isolated (03/25/2021)  Stress: No Stress Concern Present (09/10/2021)  Tobacco Use: Medium Risk (10/04/2022)   SDOH Interventions:     Readmission Risk Interventions    09/20/2021    3:27 PM  Readmission Risk Prevention Plan  Transportation Screening Complete  Medication Review (RN Care Manager) Complete  PCP or Specialist appointment within 3-5 days of discharge Complete  HRI or Home Care Consult Complete  SW Recovery Care/Counseling Consult Complete  Palliative Care Screening Not Applicable  Skilled Nursing Facility Not Applicable

## 2022-10-06 NOTE — Telephone Encounter (Signed)
Husband called in on 10/04/2022 at 6:29 pm left a message  Message reads "She has been admitted to [facility] for heart issues high BP and breathing/ ok to hold until Monday office to let Dr. Vassie Loll know where she is.

## 2022-10-06 NOTE — Progress Notes (Signed)
PROGRESS NOTE        PATIENT DETAILS Name: Kim [name] Age: 67 y.o. Sex: female Date of Birth: 01-01-1956 Admit Date: 10/04/2022 Admitting Physician Mauricio Annett Gula, MD JXB:[id number], Kandee Keen, NP  Brief Summary: Patient is a 67 y.o.  female with history of COPD and chronic hypoxic respiratory failure on home O2, OSA on CPAP-presenting with shortness of breath-found to have COPD exacerbation.  Significant events: 6/29>> admit to [facility]  Significant studies: 6/29>> CXR: Bibasilar atelectasis  Significant microbiology data: 6/29>> COVID PCR: Negative  Procedures: None  Consults: None  Subjective: Lying comfortably in bed-denies any chest pain or shortness of breath.  Feels overall better.  Has not yet ambulated.  Objective: Vitals: Blood pressure 128/61, pulse 68, temperature 98 F (36.7 C), temperature source Oral, resp. rate 18, SpO2 95 %.   Exam: Gen Exam:Alert awake-not in any distress HEENT:atraumatic, normocephalic Chest: B/L clear to auscultation anteriorly CVS:S1S2 regular Abdomen:soft non tender, non distended Extremities:no edema Neurology: Non focal Skin: no rash  Pertinent Labs/Radiology:    Latest Ref Rng & Units 10/06/2022    5:52 AM 10/05/2022    4:08 AM 10/04/2022   12:00 PM  CBC  WBC 4.0 - 10.5 K/uL 19.6  11.3  15.3   Hemoglobin 12.0 - 15.0 g/dL 21.3  08.6  57.8   Hematocrit 36.0 - 46.0 % 36.7  41.6  46.4   Platelets 150 - 400 K/uL 236  220  225     Lab Results  Component Value Date   NA 138 10/06/2022   K 4.0 10/06/2022   CL 99 10/06/2022   CO2 28 10/06/2022      Assessment/Plan: COPD exacerbation Better-hardly any wheezing Continue bronchodilators Change from IV Solu-Medrol to prednisone Continue empiric Zithromax. Mobilize today-if stable-Home likely tomorrow.  Chronic HFrEF Euvolemic 1 dose of IV Lasix today and then resume oral furosemide starting tomorrow Resume Farxiga Continue  beta-blocker/Aldactone Keep in negative balance.  HLD Statin/Zetia  Chronic hypoxic respiratory failure on 2 L of oxygen at home At baseline  OSA CPAP qhs  CKD stage IIIa At baseline Avoid nephrotoxic agents  Anxiety/depression Stable Scheduled Paxil As needed Xanax  BMI: Estimated body mass index is 25.28 kg/m as calculated from the following:   Height as of 09/24/22: 5' 1.5" (1.562 m).   Weight as of 09/24/22: 61.7 kg.   Code status:   Code Status: DNR   DVT Prophylaxis: enoxaparin (LOVENOX) injection 40 mg Start: 10/06/22 1145 SCDs Start: 10/04/22 1607   Family Communication: Spouse at bedside   Disposition Plan: Status is: Inpatient Remains inpatient appropriate because: Severity of illness   Planned Discharge Destination:Home health   Diet: Diet Order             Diet Heart Room service appropriate? Yes; Fluid consistency: Thin  Diet effective now                     Antimicrobial agents: Anti-infectives (From admission, onward)    Start     Dose/Rate Route Frequency Ordered Stop   10/04/22 1715  azithromycin (ZITHROMAX) tablet 500 mg        500 mg Oral Daily 10/04/22 1622 10/09/22 0959        MEDICATIONS: Scheduled Meds:  arformoterol  15 mcg Nebulization BID   aspirin EC  81 mg Oral Daily  azithromycin  500 mg Oral Daily   bisoprolol  2.5 mg Oral Daily   budesonide (PULMICORT) nebulizer solution  0.25 mg Nebulization BID   dapagliflozin propanediol  10 mg Oral Daily   enoxaparin (LOVENOX) injection  40 mg Subcutaneous Q24H   ezetimibe  10 mg Oral Daily   loratadine  10 mg Oral Daily   PARoxetine  40 mg Oral Daily   predniSONE  40 mg Oral Q breakfast   revefenacin  175 mcg Nebulization Daily   rosuvastatin  20 mg Oral Daily   [START ON 10/07/2022] spironolactone  12.5 mg Oral Daily   Continuous Infusions: PRN Meds:.acetaminophen **OR** acetaminophen, albuterol, ALPRAZolam, butalbital-acetaminophen-caffeine,  HYDROcodone-acetaminophen, magnesium oxide, ondansetron **OR** ondansetron (ZOFRAN) IV, polyethylene glycol   I have personally reviewed following labs and imaging studies  LABORATORY DATA: CBC: Recent Labs  Lab 10/04/22 1200 10/05/22 0408 10/06/22 0552  WBC 15.3* 11.3* 19.6*  NEUTROABS  --   --  16.5*  HGB 14.5 13.0 11.9*  HCT 46.4* 41.6 36.7  MCV 99.4 97.4 95.8  PLT 225 220 236    Basic Metabolic Panel: Recent Labs  Lab 10/04/22 1200 10/05/22 0408 10/06/22 0552  NA 140 138 138  K 3.7 3.8 4.0  CL 98 97* 99  CO2 28 29 28   GLUCOSE 161* 222* 138*  BUN 27* 27* 28*  CREATININE 1.23* 1.27* 1.26*  CALCIUM 10.3 10.6* 9.9    GFR: Estimated Creatinine Clearance: 36.5 mL/min (A) (by C-G formula based on SCr of 1.26 mg/dL (H)).  Liver Function Tests: Recent Labs  Lab 10/04/22 1200  AST 23  ALT 29  ALKPHOS 69  BILITOT 0.8  PROT 6.4*  ALBUMIN 3.6   No results for input(s): "LIPASE", "AMYLASE" in the last 168 hours. No results for input(s): "AMMONIA" in the last 168 hours.  Coagulation Profile: No results for input(s): "INR", "PROTIME" in the last 168 hours.  Cardiac Enzymes: No results for input(s): "CKTOTAL", "CKMB", "CKMBINDEX", "TROPONINI" in the last 168 hours.  BNP (last 3 results) No results for input(s): "PROBNP" in the last 8760 hours.  Lipid Profile: No results for input(s): "CHOL", "HDL", "LDLCALC", "TRIG", "CHOLHDL", "LDLDIRECT" in the last 72 hours.  Thyroid Function Tests: No results for input(s): "TSH", "T4TOTAL", "FREET4", "T3FREE", "THYROIDAB" in the last 72 hours.  Anemia Panel: No results for input(s): "VITAMINB12", "FOLATE", "FERRITIN", "TIBC", "IRON", "RETICCTPCT" in the last 72 hours.  Urine analysis:    Component Value Date/Time   COLORURINE YELLOW 08/26/2021 0805   APPEARANCEUR CLEAR 08/26/2021 0805   LABSPEC 1.021 08/26/2021 0805   PHURINE 5.0 08/26/2021 0805   GLUCOSEU NEGATIVE 08/26/2021 0805   HGBUR NEGATIVE 08/26/2021 0805    HGBUR trace-lysed 02/26/2010 0951   BILIRUBINUR NEGATIVE 08/26/2021 0805   BILIRUBINUR negative 04/29/2021 1243   KETONESUR NEGATIVE 08/26/2021 0805   PROTEINUR NEGATIVE 08/26/2021 0805   UROBILINOGEN 0.2 04/29/2021 1243   UROBILINOGEN 0.2 02/26/2010 0951   NITRITE NEGATIVE 08/26/2021 0805   LEUKOCYTESUR NEGATIVE 08/26/2021 0805    Sepsis Labs: Lactic Acid, Venous    Component Value Date/Time   LATICACIDVEN 2.1 (HH) 04/03/2020 0538    MICROBIOLOGY: Recent Results (from the past 240 hour(s))  SARS Coronavirus 2 by RT PCR (hospital order, performed in [facility] hospital lab) *cepheid single result test* Anterior Nasal Swab     Status: None   Collection Time: 10/04/22 12:00 PM   Specimen: Anterior Nasal Swab  Result Value Ref Range Status   SARS Coronavirus 2 by RT PCR NEGATIVE NEGATIVE  Final    Comment: Performed at [facility], [address] [place], [place] [address]    RADIOLOGY STUDIES/RESULTS: DG Chest Portable 1 View  Result Date: 10/04/2022 CLINICAL DATA:  Shortness of breath EXAM: PORTABLE CHEST 1 VIEW COMPARISON:  Chest x-ray September 10, 2022 FINDINGS: The cardiomediastinal silhouette is unchanged in contour. Calcified atherosclerosis of the aortic arch. Bibasilar bandlike opacities, likely atelectasis. No additional focal pulmonary opacity. No pleural effusion or pneumothorax. The visualized upper abdomen is unremarkable. No acute osseous abnormality. IMPRESSION: Bibasilar bandlike opacities, likely atelectasis. Electronically Signed   By: Jacob Moores M.D.   On: 10/04/2022 13:46     LOS: 2 days   Jeoffrey Massed, MD  [facility]    To contact the attending provider between 7A-7P or the covering provider during after hours 7P-7A, please log into the web site [email or web address] and access using universal  password for that web site. If you do not have the password, please call the hospital operator.  10/06/2022, 10:50 AM

## 2022-10-06 NOTE — Progress Notes (Signed)
PT Cancellation Note  Patient Details Name: Kim [name] MRN: [id number] DOB: [birth year]   Cancelled Treatment:    Reason Eval/Treat Not Completed: Patient declined, no reason specified. Patient states she was just here and does not want PT while here this time. Planning to go home tomorrow and husband helps her at home. Signing off as patient does not want.    Sonny Poth 10/06/2022, 10:24 AM

## 2022-10-07 DIAGNOSIS — I10 Essential (primary) hypertension: Secondary | ICD-10-CM | POA: Diagnosis not present

## 2022-10-07 DIAGNOSIS — J441 Chronic obstructive pulmonary disease with (acute) exacerbation: Secondary | ICD-10-CM | POA: Diagnosis not present

## 2022-10-07 DIAGNOSIS — I5022 Chronic systolic (congestive) heart failure: Secondary | ICD-10-CM | POA: Diagnosis not present

## 2022-10-07 DIAGNOSIS — F419 Anxiety disorder, unspecified: Secondary | ICD-10-CM | POA: Diagnosis not present

## 2022-10-07 MED ORDER — PREDNISONE 10 MG PO TABS: ORAL_TABLET | ORAL | 0 refills | Status: DC

## 2022-10-07 NOTE — TOC Transition Note (Signed)
Transition of Care [facility]) - CM/SW Discharge Note   Patient Details  Name: Kim [name] MRN: [id number] Date of Birth: May 13, 1955  [facility] CM/SW Contact:  Gordy Clement, RN Phone Number: 10/07/2022, 9:07 AM   Clinical Narrative:     Patient will dc to home today with Husband. Portable O2 will be brought from home. Patient will go to outpatient rehab at [facility]. Husband to transport  No additional TOC needs       Barriers to Discharge: Continued Medical Work up   Patient Goals and CMS Choice CMS Medicare.gov Compare Post Acute Care list provided to:: Other (Comment Required) (N/A   Patient already arranged for OPPT and OT at [facility] from last hospitalization. Would like to keep the same plan) Choice offered to / list presented to : NA  Discharge Placement                         Discharge Plan and Services Additional resources added to the After Visit Summary for   In-house Referral: NA Discharge Planning Services: CM Consult Post Acute Care Choice: NA          DME Arranged: N/A DME Agency: [employer] (O2 provided by [employer])       HH Arranged: NA HH Agency: NA (Pateint will go to OP Rehab at [place] per previous DC plan)        Social Determinants of Health (SDOH) Interventions SDOH Screenings   Food Insecurity: Patient Declined (10/04/2022)  Housing: Patient Declined (10/04/2022)  Transportation Needs: Unknown (10/04/2022)  Utilities: Patient Declined (10/04/2022)  Alcohol Screen: Low Risk  (03/22/2020)  Depression (PHQ2-9): Medium Risk (09/10/2021)  Financial Resource Strain: High Risk (09/10/2021)  Physical Activity: Inactive (03/25/2021)  Social Connections: Moderately Isolated (03/25/2021)  Stress: No Stress Concern Present (09/10/2021)  Tobacco Use: Medium Risk (10/04/2022)     Readmission Risk Interventions    09/20/2021    3:27 PM  Readmission Risk Prevention Plan  Transportation Screening Complete  Medication Review (RN  Care Manager) Complete  PCP or Specialist appointment within 3-5 days of discharge Complete  HRI or Home Care Consult Complete  SW Recovery Care/Counseling Consult Complete  Palliative Care Screening Not Applicable  Skilled Nursing Facility Not Applicable

## 2022-10-07 NOTE — Discharge Summary (Addendum)
PATIENT DETAILS Name: Kim [name] Age: 67 y.o. Sex: female Date of Birth: [birth year] MRN: [id number]. Admitting Physician: Coralie Keens, MD WUJ:[id number], Kandee Keen, NP  Admit Date: 10/04/2022 Discharge date: 10/07/2022  Recommendations for Outpatient Follow-up:  Follow up with PCP in 1-2 weeks Please obtain CMP/CBC in one week Please ensure follow-up with pulmonology.    Admitted From:  Home  Disposition: Home   Discharge Condition: good  CODE STATUS:   Code Status: DNR   Diet recommendation:  Diet Order             Diet - low sodium heart healthy           Diet Carb Modified           Diet Heart Room service appropriate? Yes; Fluid consistency: Thin  Diet effective now                    Brief Summary: Patient is a 67 y.o.  female with history of COPD and chronic hypoxic respiratory failure on home O2, OSA on CPAP-presenting with shortness of breath-found to have COPD exacerbation.   Significant events: 6/29>> admit to [facility]   Significant studies: 6/29>> CXR: Bibasilar atelectasis   Significant microbiology data: 6/29>> COVID PCR: Negative   Procedures: None   Consults: None  Brief Hospital Course: COPD exacerbation Chronic prednisone use Much better after treatment with steroids/bronchodilators and empiric Zithromax She feels she is back to her baseline She has had numerous recurrent hospitalizations recently Continue to taper prednisone until she is back to her usual prednisone regimen.  Has completed 3 days of Zithromax during this hospitalization-does not need any further antibiotics on discharge-as no definite pneumonia was seen on CXR. She is trying to get a follow-up appointment with her primary pulmonologist Dr. Vassie Loll moved up-has an existing appointment later this month Since she is back to her baseline-stable for discharge today.     Chronic HFrEF Euvolemic Resume diuretics/Farxiga/beta-blocker.    HLD Statin/Zetia    Chronic hypoxic respiratory failure on 2 L of oxygen at home At baseline   OSA CPAP qhs   CKD stage IIIa At baseline Avoid nephrotoxic agents   Anxiety/depression Stable Scheduled Paxil As needed Xanax  Obesity: Estimated body mass index is 25.28 kg/m as calculated from the following:   Height as of 09/24/22: 5' 1.5" (1.562 m).   Weight as of 09/24/22: 61.7 kg.     Discharge Diagnoses:  Active Problems:   COPD exacerbation (HCC)   Chronic systolic CHF (congestive heart failure) (HCC)   Essential hypertension   Anxiety and depression   Stage 3a chronic kidney disease (CKD) (HCC)   Dyslipidemia   Discharge Instructions:  Activity:  As tolerated with Full fall precautions use walker/cane & assistance as needed  Discharge Instructions     Call MD for:  difficulty breathing, headache or visual disturbances   Complete by: As directed    Diet - low sodium heart healthy   Complete by: As directed    Diet Carb Modified   Complete by: As directed    Discharge instructions   Complete by: As directed    Follow with Primary MD  Shirline Frees, NP in 1-2 weeks  Please get a complete blood count and chemistry panel checked by your Primary MD at your next visit, and again as instructed by your Primary MD.  Get Medicines reviewed and adjusted: Please take all your medications with you for your next visit with your Primary MD  Laboratory/radiological data: Please request your Primary MD to go over all hospital tests and procedure/radiological results at the follow up, please ask your Primary MD to get all Hospital records sent to his/her office.  In some cases, they will be blood work, cultures and biopsy results pending at the time of your discharge. Please request that your primary care M.D. follows up on these results.  Also Note the following: If you experience worsening of your admission symptoms, develop shortness of breath, life threatening emergency, suicidal or  homicidal thoughts you must seek medical attention immediately by calling 911 or calling your MD immediately  if symptoms less severe.  You must read complete instructions/literature along with all the possible adverse reactions/side effects for all the Medicines you take and that have been prescribed to you. Take any new Medicines after you have completely understood and accpet all the possible adverse reactions/side effects.   Do not drive when taking Pain medications or sleeping medications (Benzodaizepines)  Do not take more than prescribed Pain, Sleep and Anxiety Medications. It is not advisable to combine anxiety,sleep and pain medications without talking with your primary care practitioner  Special Instructions: If you have smoked or chewed Tobacco  in the last 2 yrs please stop smoking, stop any regular Alcohol  and or any Recreational drug use.  Wear Seat belts while driving.  Please note: You were cared for by a hospitalist during your hospital stay. Once you are discharged, your primary care physician will handle any further medical issues. Please note that NO REFILLS for any discharge medications will be authorized once you are discharged, as it is imperative that you return to your primary care physician (or establish a relationship with a primary care physician if you do not have one) for your post hospital discharge needs so that they can reassess your need for medications and monitor your lab values.   Increase activity slowly   Complete by: As directed    No wound care   Complete by: As directed       Allergies as of 10/07/2022       Reactions   Tape Other (See Comments)   Skin tears easily, peels skin off   Daliresp [roflumilast] Other (See Comments)   Skin peeling   Cozaar [losartan] Other (See Comments)   Possible cause of severe hypotension   Dupixent [dupilumab] Diarrhea, Swelling, Cough   Facial swelling Headache   Latex Other (See Comments)   Peels skin off    Morphine And Codeine Other (See Comments)   Hallucinations    Penicillins Hives   Statins Other (See Comments)   Myalgia   Zestril [lisinopril] Cough   Omnicef [cefdinir] Rash        Medication List     TAKE these medications    albuterol 108 (90 Base) MCG/ACT inhaler Commonly known as: VENTOLIN HFA INHALE 2 PUFFS INTO THE LUNGS EVERY 4 TO 6 HOURS AS NEEDED FOR SHORTNESS OF BREATH OR WHEEZING What changed: See the new instructions.   ALPRAZolam 0.5 MG tablet Commonly known as: XANAX Take 1 tablet (0.5 mg total) by mouth every 6 (six) hours.   ARTIFICIAL TEARS OP Place 1 drop into both eyes 2 (two) times daily as needed (dryness, irritation).   aspirin EC 81 MG tablet Take 1 tablet (81 mg total) by mouth daily.   bisoprolol 5 MG tablet Commonly known as: ZEBETA Take 0.5 tablets (2.5 mg total) by mouth daily as needed (Take HALF tablet by mouth for  break through chest pain as needed). What changed: when to take this   budesonide-formoterol 160-4.5 MCG/ACT inhaler Commonly known as: Symbicort Inhale 2 puffs into the lungs 2 (two) times daily.   butalbital-acetaminophen-caffeine 50-325-40 MG tablet Commonly known as: FIORICET Take 1 tablet by mouth every 4 (four) hours as needed for headache. TAKE 1 TABLET BY MOUTH EVERY 4 HOURS AS NEEDED FOR HEADACHETAKE 1 TABLET BY MOUTH EVERY 4 HOURS AS NEEDED FOR HEADACHE   cetirizine 10 MG tablet Commonly known as: ZYRTEC Take 10 mg by mouth daily.   ezetimibe 10 MG tablet Commonly known as: ZETIA TAKE 1 TABLET(10 MG) BY MOUTH DAILY What changed: See the new instructions.   Farxiga 10 MG Tabs tablet Generic drug: dapagliflozin propanediol Take 1 tablet (10 mg total) by mouth daily.   furosemide 40 MG tablet Commonly known as: LASIX Take 1 tablet (40 mg total) by mouth daily. What changed:  when to take this reasons to take this   HYDROcodone-acetaminophen 7.5-325 MG tablet Commonly known as: Norco Take 1 tablet by  mouth every 6 (six) hours as needed for moderate pain.   ipratropium-albuterol 0.5-2.5 (3) MG/3ML Soln Commonly known as: DUONEB USE 3 ML VIA NEBULIZER EVERY 6 HOURS AS NEEDED What changed:  how much to take how to take this when to take this reasons to take this additional instructions   magnesium oxide 400 MG tablet Commonly known as: MAG-OX Take 400 mg by mouth daily as needed (constipation).   Mucinex Maximum Strength 1200 MG Tb12 Generic drug: Guaifenesin Take 1,200 mg by mouth every 12 (twelve) hours.   nitroGLYCERIN 0.4 MG SL tablet Commonly known as: NITROSTAT Place 1 tablet (0.4 mg total) under the tongue every 5 (five) minutes as needed for chest pain.   nystatin 100000 UNIT/ML suspension Commonly known as: MYCOSTATIN Take 5 mLs (500,000 Units total) by mouth 4 (four) times daily.   ondansetron 4 MG tablet Commonly known as: Zofran Take 1 tablet (4 mg total) by mouth every 8 (eight) hours as needed for nausea or vomiting.   OXYGEN Inhale 2-3 L/min into the lungs continuous.   PARoxetine 40 MG tablet Commonly known as: PAXIL TAKE 1 TABLET(40 MG) BY MOUTH EVERY MORNING What changed: See the new instructions.   polyethylene glycol powder 17 GM/SCOOP powder Commonly known as: GLYCOLAX/MIRALAX Take 17 g by mouth daily as needed for moderate constipation (constipation).   potassium chloride 10 MEQ tablet Commonly known as: KLOR-CON Take 1 tablet (10 mEq total) by mouth as needed (take with furosemide (lasix)). What changed:  when to take this reasons to take this   predniSONE 10 MG tablet Commonly known as: DELTASONE Take 4 tablets daily for 2days, then take 3 tablets daily for 2 days, then take 2 tablets daily for 2 days, then go back to your usual regimen (5 mg on Monday Wednesday Friday, 10 mg Tuesdays, Thursdays, Saturdays and Sundays) What changed:  additional instructions Another medication with the same name was removed. Continue taking this  medication, and follow the directions you see here.   rosuvastatin 20 MG tablet Commonly known as: CRESTOR Take 1 tablet (20 mg total) by mouth daily.   Spacer/Aero Chamber Mouthpiece Misc 1 Device by Does not apply route as directed.   spironolactone 25 MG tablet Commonly known as: ALDACTONE Take 0.5 tablets (12.5 mg total) by mouth daily.   VITAMIN B-12 PO Take 1 tablet by mouth daily.   VITAMIN D3 PO Take 5,000 Units by mouth daily.  Follow-up Information     Shirline Frees, NP. Schedule an appointment as soon as possible for a visit in 1 week(s).   Specialty: Family Medicine Contact information: [address] [place] [address] [phone]         Oretha Milch, MD. Schedule an appointment as soon as possible for a visit in 2 week(s).   Specialty: Pulmonary Disease Contact information: [address] [place] [address] [phone]                Allergies  Allergen Reactions   Tape Other (See Comments)    Skin tears easily, peels skin off   Daliresp [Roflumilast] Other (See Comments)    Skin peeling   Cozaar [Losartan] Other (See Comments)    Possible cause of severe hypotension   Dupixent [Dupilumab] Diarrhea, Swelling and Cough    Facial swelling Headache   Latex Other (See Comments)    Peels skin off   Morphine And Codeine Other (See Comments)    Hallucinations    Penicillins Hives   Statins Other (See Comments)    Myalgia    Zestril [Lisinopril] Cough   Omnicef [Cefdinir] Rash     Other Procedures/Studies: DG Chest Portable 1 View  Result Date: 10/04/2022 CLINICAL DATA:  Shortness of breath EXAM: PORTABLE CHEST 1 VIEW COMPARISON:  Chest x-ray September 10, 2022 FINDINGS: The cardiomediastinal silhouette is unchanged in contour. Calcified atherosclerosis of the aortic arch. Bibasilar bandlike opacities, likely atelectasis. No additional focal pulmonary opacity. No pleural effusion or pneumothorax. The  visualized upper abdomen is unremarkable. No acute osseous abnormality. IMPRESSION: Bibasilar bandlike opacities, likely atelectasis. Electronically Signed   By: Jacob Moores M.D.   On: 10/04/2022 13:46   VAS US CAROTID  Result Date: 09/22/2022 Carotid Arterial Duplex Study Patient Name:  DEVOIRY REISCH  Date of Exam:   09/22/2022 Medical Rec #: [id number]        Accession #:    [id number] Date of Birth: 09/20/1955       Patient Gender: F Patient Age:   41 years Exam Location:  [facility] Procedure:      VAS US CAROTID Referring Phys: Eligha Bridegroom --------------------------------------------------------------------------------  Indications:       Carotid artery disease and Patient complains of dizziness                    after standing up. Otherwise, she denies any other                    cerebrovascular symptoms today. Risk Factors:      Hypertension, hyperlipidemia, past history of smoking, prior                    MI, coronary artery disease. Other Factors:     History of COPD, patient on continuous oxygen today. Limitations        Today's exam was limited due to the patient's respiratory                    variation. Comparison Study:  Prior carotid duplex exam on 09/20/2021 showed highest                    velocities in right mid ICA 161/44 cm/s and left proximal ICA                    91/22 cm/s. Performing Technologist: Carlos American RVT, RDCS (AE), RDMS  Examination Guidelines: A complete evaluation includes B-mode imaging, spectral Doppler, color Doppler, and power Doppler as needed of all accessible portions of each vessel. Bilateral testing is considered an integral part of a complete examination. Limited examinations for reoccurring indications may be performed as noted.  Right Carotid Findings: +----------+--------+--------+--------+------------------+------------------+           PSV cm/sEDV cm/sStenosisPlaque DescriptionComments            +----------+--------+--------+--------+------------------+------------------+ CCA Prox  67      11                                                   +----------+--------+--------+--------+------------------+------------------+ CCA Distal63      12                                intimal thickening +----------+--------+--------+--------+------------------+------------------+ ICA Prox  81      12                                intimal thickening +----------+--------+--------+--------+------------------+------------------+ ICA Mid   65      13                                tortuous           +----------+--------+--------+--------+------------------+------------------+ ICA Distal92      23                                tortuous           +----------+--------+--------+--------+------------------+------------------+ ECA       98      9                                 intimal thickening +----------+--------+--------+--------+------------------+------------------+ +----------+--------+-------+----------------+-------------------+           PSV cm/sEDV cmsDescribe        Arm Pressure (mmHG) +----------+--------+-------+----------------+-------------------+ Subclavian140            Multiphasic, ZOX096                 +----------+--------+-------+----------------+-------------------+ +---------+--------+--+--------+--+---------+ VertebralPSV cm/s74EDV cm/s12Antegrade +---------+--------+--+--------+--+---------+ Unable to duplicate prior ICA velocities, vessel is tortuous with no evidence of stenosis or plaque seen.  Left Carotid Findings: +----------+--------+--------+--------+--------------------+------------------+           PSV cm/sEDV cm/sStenosisPlaque Description  Comments           +----------+--------+--------+--------+--------------------+------------------+ CCA Prox  73      11                                                      +----------+--------+--------+--------+--------------------+------------------+ CCA Mid                           diffuse and calcific                   +----------+--------+--------+--------+--------------------+------------------+  CCA Distal76      16                                  intimal thickening +----------+--------+--------+--------+--------------------+------------------+ ICA Prox  54      10                                  intimal thickening +----------+--------+--------+--------+--------------------+------------------+ ICA Mid   68      21                                                     +----------+--------+--------+--------+--------------------+------------------+ ICA Distal77      21                                                     +----------+--------+--------+--------+--------------------+------------------+ ECA       56      8                                                      +----------+--------+--------+--------+--------------------+------------------+ +----------+--------+--------+----------------+-------------------+           PSV cm/sEDV cm/sDescribe        Arm Pressure (mmHG) +----------+--------+--------+----------------+-------------------+ Subclavian218             Multiphasic, ZOX096                 +----------+--------+--------+----------------+-------------------+ +---------+--------+--+--------+--+---------+ VertebralPSV cm/s42EDV cm/s11Antegrade +---------+--------+--+--------+--+---------+   Summary: Right Carotid: The extracranial vessels were near-normal with only minimal wall                thickening or plaque. Left Carotid: The extracranial vessels were near-normal with only minimal wall               thickening or plaque. Vertebrals:  Bilateral vertebral arteries demonstrate antegrade flow. Subclavians: Normal flow hemodynamics were seen in bilateral subclavian              arteries. *See table(s) above for  measurements and observations.  Electronically signed by Nanetta Batty MD on 09/22/2022 at 5:54:01 PM.    Final    ECHOCARDIOGRAM COMPLETE  Result Date: 09/11/2022    ECHOCARDIOGRAM REPORT   Patient Name:   JESELLE KOSAKOWSKI Date of Exam: 09/11/2022 Medical Rec #:  [id number]       Height:       61.5 in Accession #:    [id number]      Weight:       140.0 lb Date of Birth:  1955-08-09      BSA:          1.633 m Patient Age:    66 years        BP:           94/61 mmHg Patient Gender: F               HR:  93 bpm. Exam Location:  Inpatient Procedure: 2D Echo, Color Doppler and Cardiac Doppler Indications:    Elevated Troponin  History:        Patient has prior history of Echocardiogram examinations, most                 recent 09/20/2021. CHF, Previous Myocardial Infarction, COPD,                 Signs/Symptoms:Shortness of Breath, Chest Pain and Syncope; Risk                 Factors:Hypertension and Dyslipidemia.  Sonographer:    Darlys Gales Referring Phys: [id number] Perlie Gold IMPRESSIONS  1. Poor endocardial visualization and no definity makes estimation of EF difficult Septum and apex as well as anterior wall hypokinetic. Left ventricular ejection fraction, by estimation, is 35 to 40%. The left ventricle has moderately decreased function. The left ventricle has no regional wall motion abnormalities. The left ventricular internal cavity size was moderately dilated. Left ventricular diastolic parameters are consistent with Grade I diastolic dysfunction (impaired relaxation).  2. Right ventricular systolic function is normal. The right ventricular size is normal.  3. The mitral valve is normal in structure. No evidence of mitral valve regurgitation. No evidence of mitral stenosis.  4. The aortic valve was not well visualized. Aortic valve regurgitation is mild. Aortic valve sclerosis is present, with no evidence of aortic valve stenosis.  5. The inferior vena cava is normal in size with greater than 50%  respiratory variability, suggesting right atrial pressure of 3 mmHg. FINDINGS  Left Ventricle: Poor endocardial visualization and no definity makes estimation of EF difficult Septum and apex as well as anterior wall hypokinetic. Left ventricular ejection fraction, by estimation, is 35 to 40%. The left ventricle has moderately decreased function. The left ventricle has no regional wall motion abnormalities. The left ventricular internal cavity size was moderately dilated. There is no left ventricular hypertrophy. Left ventricular diastolic parameters are consistent with Grade I diastolic dysfunction (impaired relaxation). Right Ventricle: The right ventricular size is normal. No increase in right ventricular wall thickness. Right ventricular systolic function is normal. Left Atrium: Left atrial size was normal in size. Right Atrium: Right atrial size was normal in size. Pericardium: There is no evidence of pericardial effusion. Mitral Valve: The mitral valve is normal in structure. No evidence of mitral valve regurgitation. No evidence of mitral valve stenosis. Tricuspid Valve: The tricuspid valve is normal in structure. Tricuspid valve regurgitation is not demonstrated. No evidence of tricuspid stenosis. Aortic Valve: The aortic valve was not well visualized. Aortic valve regurgitation is mild. Aortic regurgitation PHT measures 337 msec. Aortic valve sclerosis is present, with no evidence of aortic valve stenosis. Aortic valve mean gradient measures 5.0 mmHg. Aortic valve peak gradient measures 8.4 mmHg. Aortic valve area, by VTI measures 2.57 cm. Pulmonic Valve: The pulmonic valve was normal in structure. Pulmonic valve regurgitation is not visualized. No evidence of pulmonic stenosis. Aorta: The aortic root is normal in size and structure. Venous: The inferior vena cava is normal in size with greater than 50% respiratory variability, suggesting right atrial pressure of 3 mmHg. IAS/Shunts: The interatrial septum  was not well visualized.  LEFT VENTRICLE PLAX 2D LVIDd:         5.40 cm   Diastology LVIDs:         3.90 cm   LV e' medial:    4.35 cm/s LV PW:  0.80 cm   LV E/e' medial:  18.9 LV IVS:        0.80 cm   LV e' lateral:   8.70 cm/s LVOT diam:     1.80 cm   LV E/e' lateral: 9.4 LV SV:         65 LV SV Index:   40 LVOT Area:     2.54 cm  RIGHT VENTRICLE RV S prime:     11.40 cm/s TAPSE (M-mode): 1.5 cm LEFT ATRIUM             Index        RIGHT ATRIUM          Index LA Vol (A2C):   15.3 ml 9.37 ml/m   RA Area:     7.20 cm LA Vol (A4C):   22.8 ml 13.96 ml/m  RA Volume:   11.70 ml 7.16 ml/m LA Biplane Vol: 20.1 ml 12.31 ml/m  AORTIC VALVE AV Area (Vmax):    2.56 cm AV Area (Vmean):   2.11 cm AV Area (VTI):     2.57 cm AV Vmax:           145.00 cm/s AV Vmean:          106.000 cm/s AV VTI:            0.253 m AV Peak Grad:      8.4 mmHg AV Mean Grad:      5.0 mmHg LVOT Vmax:         146.00 cm/s LVOT Vmean:        88.100 cm/s LVOT VTI:          0.256 m LVOT/AV VTI ratio: 1.01 AI PHT:            337 msec  AORTA Ao Root diam: 2.40 cm MITRAL VALVE MV Area (PHT): 4.93 cm     SHUNTS MV Decel Time: 154 msec     Systemic VTI:  0.26 m MV E velocity: 82.10 cm/s   Systemic Diam: 1.80 cm MV A velocity: 107.00 cm/s MV E/A ratio:  0.77 Charlton Haws MD Electronically signed by Charlton Haws MD Signature Date/Time: 09/11/2022/3:30:55 PM    Final    DG Chest Port 1 View  Result Date: 09/10/2022 CLINICAL DATA:  Shortness of breath EXAM: PORTABLE CHEST 1 VIEW COMPARISON:  X-ray 09/08/2022 FINDINGS: Hyperinflation. Likely chronic interstitial lung changes. No consolidation, pneumothorax or effusion. No edema. Normal cardiopericardial silhouette with a calcified aorta. Overlapping cardiac leads. IMPRESSION: No acute cardiopulmonary disease. Hyperinflation with chronic changes Electronically Signed   By: Karen Kays M.D.   On: 09/10/2022 11:20   DG Chest 2 View  Result Date: 09/08/2022 CLINICAL DATA:  Cough and shortness of  breath. EXAM: CHEST - 2 VIEW COMPARISON:  CT 05/08/2022 FINDINGS: Heart size and mediastinal contours appear normal. Lungs are hyperinflated with chronic interstitial changes of emphysema. No superimposed pleural effusion, interstitial edema or airspace disease. IMPRESSION: 1. No active cardiopulmonary abnormalities. 2. Emphysema. Electronically Signed   By: Signa Kell M.D.   On: 09/08/2022 13:09     TODAY-DAY OF DISCHARGE:  Subjective:   Charlis [place] today has no headache,no chest abdominal pain,no new weakness tingling or numbness, feels much better wants to go home today.   Objective:   Blood pressure (!) 145/75, pulse 63, temperature 98.1 F (36.7 C), temperature source Oral, resp. rate 18, SpO2 97 %.  Intake/Output Summary (Last 24 hours) at 10/07/2022 0844 Last data filed at 10/07/2022 0700  Gross per 24 hour  Intake 840 ml  Output 1650 ml  Net -810 ml   There were no vitals filed for this visit.  Exam: Awake Alert, Oriented *3, No new F.N deficits, Normal affect [place].AT,PERRAL Supple Neck,No JVD, No cervical lymphadenopathy appriciated.  Symmetrical Chest wall movement, Good air movement bilaterally, CTAB RRR,No Gallops,Rubs or new Murmurs, No Parasternal Heave +ve B.Sounds, Abd Soft, Non tender, No organomegaly appriciated, No rebound -guarding or rigidity. No Cyanosis, Clubbing or edema, No new Rash or bruise   PERTINENT RADIOLOGIC STUDIES: No results found.   PERTINENT LAB RESULTS: CBC: Recent Labs    10/05/22 0408 10/06/22 0552  WBC 11.3* 19.6*  HGB 13.0 11.9*  HCT 41.6 36.7  PLT 220 236   CMET CMP     Component Value Date/Time   NA 138 10/06/2022 0552   NA 145 (H) 07/11/2022 1115   K 4.0 10/06/2022 0552   CL 99 10/06/2022 0552   CO2 28 10/06/2022 0552   GLUCOSE 138 (H) 10/06/2022 0552   BUN 28 (H) 10/06/2022 0552   BUN 24 07/11/2022 1115   CREATININE 1.26 (H) 10/06/2022 0552   CREATININE 1.16 (H) 07/19/2015 1447   CALCIUM 9.9 10/06/2022 0552    PROT 6.4 (L) 10/04/2022 1200   PROT 6.3 08/25/2022 1124   ALBUMIN 3.6 10/04/2022 1200   ALBUMIN 4.4 08/25/2022 1124   AST 23 10/04/2022 1200   ALT 29 10/04/2022 1200   ALKPHOS 69 10/04/2022 1200   BILITOT 0.8 10/04/2022 1200   BILITOT 0.3 08/25/2022 1124   GFR 47.42 (L) 09/24/2022 1020   EGFR 50 (L) 07/11/2022 1115   GFRNONAA 47 (L) 10/06/2022 0552    GFR Estimated Creatinine Clearance: 36.5 mL/min (A) (by C-G formula based on SCr of 1.26 mg/dL (H)). No results for input(s): "LIPASE", "AMYLASE" in the last 72 hours. No results for input(s): "CKTOTAL", "CKMB", "CKMBINDEX", "TROPONINI" in the last 72 hours. Invalid input(s): "POCBNP" No results for input(s): "DDIMER" in the last 72 hours. No results for input(s): "HGBA1C" in the last 72 hours. No results for input(s): "CHOL", "HDL", "LDLCALC", "TRIG", "CHOLHDL", "LDLDIRECT" in the last 72 hours. No results for input(s): "TSH", "T4TOTAL", "T3FREE", "THYROIDAB" in the last 72 hours.  Invalid input(s): "FREET3" No results for input(s): "VITAMINB12", "FOLATE", "FERRITIN", "TIBC", "IRON", "RETICCTPCT" in the last 72 hours. Coags: No results for input(s): "INR" in the last 72 hours.  Invalid input(s): "PT" Microbiology: Recent Results (from the past 240 hour(s))  SARS Coronavirus 2 by RT PCR (hospital order, performed in [facility] hospital lab) *cepheid single result test* Anterior Nasal Swab     Status: None   Collection Time: 10/04/22 12:00 PM   Specimen: Anterior Nasal Swab  Result Value Ref Range Status   SARS Coronavirus 2 by RT PCR NEGATIVE NEGATIVE Final    Comment: Performed at [facility], [address], [place], [place] [address]    FURTHER DISCHARGE INSTRUCTIONS:  Get Medicines reviewed and adjusted: Please take all your medications with you for your next visit with your Primary MD  Laboratory/radiological data: Please request your Primary MD to go over all hospital tests and procedure/radiological results  at the follow up, please ask your Primary MD to get all Hospital records sent to his/her office.  In some cases, they will be blood work, cultures and biopsy results pending at the time of your discharge. Please request that your primary care M.D. goes through all the records of your hospital data and follows up  on these results.  Also Note the following: If you experience worsening of your admission symptoms, develop shortness of breath, life threatening emergency, suicidal or homicidal thoughts you must seek medical attention immediately by calling 911 or calling your MD immediately  if symptoms less severe.  You must read complete instructions/literature along with all the possible adverse reactions/side effects for all the Medicines you take and that have been prescribed to you. Take any new Medicines after you have completely understood and accpet all the possible adverse reactions/side effects.   Do not drive when taking Pain medications or sleeping medications (Benzodaizepines)  Do not take more than prescribed Pain, Sleep and Anxiety Medications. It is not advisable to combine anxiety,sleep and pain medications without talking with your primary care practitioner  Special Instructions: If you have smoked or chewed Tobacco  in the last 2 yrs please stop smoking, stop any regular Alcohol  and or any Recreational drug use.  Wear Seat belts while driving.  Please note: You were cared for by a hospitalist during your hospital stay. Once you are discharged, your primary care physician will handle any further medical issues. Please note that NO REFILLS for any discharge medications will be authorized once you are discharged, as it is imperative that you return to your primary care physician (or establish a relationship with a primary care physician if you do not have one) for your post hospital discharge needs so that they can reassess your need for medications and monitor your lab values.  Total  Time spent coordinating discharge including counseling, education and face to face time equals greater than 30 minutes.  SignedJeoffrey Massed 10/07/2022 8:44 AM

## 2022-10-07 NOTE — Consult Note (Signed)
   [facility] CM Inpatient Consult   10/07/2022  Kim [name] [birth year] [id number]  [facility] [[facility]]  Accountable Care Organization [ACO] Patient: Medicare ACO REACH  Primary Care Provider:  Shirline Frees, NP with [place] at [facility] which is listed to provide the Transition of Care follow up   Patient screened for less than 30 day readmission hospitalization with noted high risk score for unplanned readmission risk and to assess for potential [facility]  [[facility]] Care Management service needs for post hospital transition for care coordination.  Review of patient's electronic medical record reveals patient is for home with OP rehab for follow up.  Encounters reveals patient has been outreached by a RN CC on behalf of her [facility] benefits for post hospital care coordination. Patient has been offered care coordination assistance and states no needs.  Patient has transitioned home and called phone number provided, HIPAA verified, to ascertain any ongoing needs.  Patient states she felt no needs.  Patient thinks she may have finished her doses of Erythromycin but will check her AVS closer to see.  Explained that a RN CC may call to check on everything as a benefit of [facility] and PCP follow up for transitional needs.  Patient verbalized understanding.   Plan:  Expect community TOC follow up for any needs.  Of note, [facility] Care Management/Population Health does not replace or interfere with any arrangements made by the Inpatient Transition of Care team.  For questions contact:   Charlesetta Shanks, RN BSN CCM [facility]  [phone] business mobile phone Toll free office [phone]  *Concierge Line  [phone] Fax number: [phone] [email or web address]

## 2022-10-08 ENCOUNTER — Telehealth: Payer: Self-pay

## 2022-10-08 NOTE — Transitions of Care (Post Inpatient/ED Visit) (Signed)
10/08/2022  Name: Kim [name] MRN: [id number] DOB: 05-Jul-1955  Today's TOC FU Call Status: Today's TOC FU Call Status:: Successful TOC FU Call Competed TOC FU Call Complete Date: 10/08/22  Transition Care Management Follow-up Telephone Call Date of Discharge: 10/07/22 Discharge Facility: [facility]) Type of Discharge: Inpatient Admission Primary Inpatient Discharge Diagnosis:: "COPD exacerbation, NSTEMI" How have you been since you were released from the hospital?: Better (Pt voices she rested well last night-just getting day started. Denies any SOB or resp issues-states she is 'doing as normal as can be." She is wearing oxygen 2-3L/min cont.) Any questions or concerns?: No  Items Reviewed: Did you receive and understand the discharge instructions provided?: Yes Medications obtained,verified, and reconciled?: Yes (Medications Reviewed) Any new allergies since your discharge?: No Dietary orders reviewed?: Yes Type of Diet Ordered:: low salt/heart healthy Do you have support at home?: Yes People in Home: spouse Name of Support/Comfort Primary Source: [place] Distance  Medications Reviewed Today: Medications Reviewed Today     Reviewed by Charlyn Minerva, RN (Registered Nurse) on 10/08/22 at 1048  Med List Status: <None>   Medication Order Taking? Sig Documenting Provider Last Dose Status Informant  albuterol (VENTOLIN HFA) 108 (90 Base) MCG/ACT inhaler [id number] Yes INHALE 2 PUFFS INTO THE LUNGS EVERY 4 TO 6 HOURS AS NEEDED FOR SHORTNESS OF BREATH OR WHEEZING  Patient taking differently: Inhale 2 puffs into the lungs See admin instructions. Inhale 2 puffs every 4-6 hours as needed for wheezing or shortness or breath.   Noemi Chapel, NP Taking Active Self  ALPRAZolam Prudy Feeler) 0.5 MG tablet [id number] Yes Take 1 tablet (0.5 mg total) by mouth every 6 (six) hours. Nafziger, Kandee Keen, NP Taking Active Self  aspirin EC 81 MG EC tablet [id number] Yes Take 1 tablet (81 mg total)  by mouth daily. Rhetta Mura, MD Taking Active Self           Med Note Jomarie Longs, [place]   KGM Oct 04, 2022  2:49 PM)    bisoprolol (ZEBETA) 5 MG tablet [id number] Yes Take 0.5 tablets (2.5 mg total) by mouth daily as needed (Take HALF tablet by mouth for break through chest pain as needed).  Patient taking differently: Take 2.5 mg by mouth daily.   Kathleene Hazel, MD Taking Active Self  budesonide-formoterol [facility]) 160-4.5 MCG/ACT inhaler [id number] Yes Inhale 2 puffs into the lungs 2 (two) times daily. Oretha Milch, MD Taking Active Self  butalbital-acetaminophen-caffeine (FIORICET) [phone] MG tablet [id number] Yes Take 1 tablet by mouth every 4 (four) hours as needed for headache. TAKE 1 TABLET BY MOUTH EVERY 4 HOURS AS NEEDED FOR HEADACHETAKE 1 TABLET BY MOUTH EVERY 4 HOURS AS NEEDED FOR HEADACHE Nafziger, Kandee Keen, NP Taking Active Self  Carboxymethylcellulose Sodium (ARTIFICIAL TEARS OP) [id number] Yes Place 1 drop into both eyes 2 (two) times daily as needed (dryness, irritation). [provider] Taking Active Self  cetirizine (ZYRTEC) 10 MG tablet [id number] Yes Take 10 mg by mouth daily. [provider] Taking Active Self  Cholecalciferol (VITAMIN D3 PO) [id number] Yes Take 5,000 Units by mouth daily. [provider] Taking Active Self  Cyanocobalamin (VITAMIN B-12 PO) [id number] Yes Take 1 tablet by mouth daily. [provider] Taking Active Self  dapagliflozin propanediol (FARXIGA) 10 MG TABS tablet [id number] Yes Take 1 tablet (10 mg total) by mouth daily. Arrien, York Ram, MD Taking Active Self  ezetimibe (ZETIA) 10 MG tablet [id number] Yes TAKE 1 TABLET(10 MG) BY MOUTH DAILY  Patient taking  differently: Take 10 mg by mouth daily.   Nafziger, Kandee Keen, NP Taking Active Self  furosemide (LASIX) 40 MG tablet [id number] Yes Take 1 tablet (40 mg total) by mouth daily.  Patient taking differently: Take 40 mg by mouth daily as needed for  fluid.   Kathleene Hazel, MD Taking Active Self  HYDROcodone-acetaminophen [facility]) 7.5-325 MG tablet [id number] Yes Take 1 tablet by mouth every 6 (six) hours as needed for moderate pain. Nafziger, Kandee Keen, NP Taking Active Self  ipratropium-albuterol (DUONEB) 0.5-2.5 (3) MG/3ML SOLN [id number] Yes USE 3 ML VIA NEBULIZER EVERY 6 HOURS AS NEEDED  Patient taking differently: Take 3 mLs by nebulization every 6 (six) hours as needed (wheezing, shortness of breath).   Oretha Milch, MD Taking Active Self           Med Note (COFFELL, Marzella Schlein   Fri Sep 12, 2022  6:21 PM) Pt specifically asked to to not change directions on this medication, as to not mess up her prescriptions. Pt has been taking TID from 6/2-6/4.  magnesium oxide (MAG-OX) 400 MG tablet [id number] Yes Take 400 mg by mouth daily as needed (constipation). [provider] Taking Active Self  MUCINEX MAXIMUM STRENGTH 1200 MG TB12 [id number] Yes Take 1,200 mg by mouth every 12 (twelve) hours. [provider] Taking Active Self  nitroGLYCERIN (NITROSTAT) 0.4 MG SL tablet [id number] Yes Place 1 tablet (0.4 mg total) under the tongue every 5 (five) minutes as needed for chest pain. Kathleene Hazel, MD Taking Active Self  nystatin (MYCOSTATIN) 100000 UNIT/ML suspension [id number] Yes Take 5 mLs (500,000 Units total) by mouth 4 (four) times daily. Nafziger, Kandee Keen, NP Taking Active Self  ondansetron (ZOFRAN) 4 MG tablet [id number] Yes Take 1 tablet (4 mg total) by mouth every 8 (eight) hours as needed for nausea or vomiting. Shirline Frees, NP Taking Active Self  OXYGEN [id number]  Inhale 2-3 L/min into the lungs continuous. [provider]  Active Self  PARoxetine (PAXIL) 40 MG tablet [id number] Yes TAKE 1 TABLET(40 MG) BY MOUTH EVERY MORNING  Patient taking differently: Take 40 mg by mouth daily.   Nafziger, Kandee Keen, NP Taking Active Self  polyethylene glycol powder (GLYCOLAX/MIRALAX) 17 GM/SCOOP powder [id number] Yes Take  17 g by mouth daily as needed for moderate constipation (constipation). [provider] Taking Active Self  potassium chloride (KLOR-CON) 10 MEQ tablet [id number] Yes Take 1 tablet (10 mEq total) by mouth as needed (take with furosemide (lasix)).  Patient taking differently: Take 10 mEq by mouth daily as needed (with furosemide.).   Kathleene Hazel, MD Taking Active Self  predniSONE (DELTASONE) 10 MG tablet [id number] Yes Take 4 tablets daily for 2days, then take 3 tablets daily for 2 days, then take 2 tablets daily for 2 days, then go back to your usual regimen (5 mg on Monday Wednesday Friday, 10 mg Tuesdays, Thursdays, Saturdays and Sundays) Ghimire, Werner Lean, MD Taking Active   rosuvastatin (CRESTOR) 20 MG tablet [id number] Yes Take 1 tablet (20 mg total) by mouth daily. Kathleene Hazel, MD Taking Active Self  Spacer/Aero Chamber Mouthpiece MISC [id number] Yes 1 Device by Does not apply route as directed. Roslynn Amble, MD Taking Active Self  spironolactone (ALDACTONE) 25 MG tablet [id number] Yes Take 0.5 tablets (12.5 mg total) by mouth daily. Arrien, York Ram, MD Taking Active Self  Med List Note Clinton Gallant 09/17/21 2703): Also uses mail order            Home  Care and Equipment/Supplies: Were Home Health Services Ordered?: NA Any new equipment or medical supplies ordered?: NA  Functional Questionnaire: Do you need assistance with bathing/showering or dressing?: No Do you need assistance with meal preparation?: No Do you need assistance with eating?: No Do you have difficulty maintaining continence: No Do you need assistance with getting out of bed/getting out of a chair/moving?: No Do you have difficulty managing or taking your medications?: No  Follow up appointments reviewed: PCP Follow-up appointment confirmed?: Yes Date of PCP follow-up appointment?: 10/21/22 Follow-up Provider: [facility] Follow-up  appointment confirmed?: Yes Date of Specialist follow-up appointment?: 10/13/22 (Pt will call Dr. Reginia Naas office to make follow up appt) Follow-Up Specialty Provider:: Robin Searing Do you need transportation to your follow-up appointment?: No (pt confirms spouse able to drive her to appts) Do you understand care options if your condition(s) worsen?: Yes-patient verbalized understanding  SDOH Interventions Today    Flowsheet Row Most Recent Value  SDOH Interventions   Food Insecurity Interventions Intervention Not Indicated  Transportation Interventions Intervention Not Indicated      TOC Interventions Today    Flowsheet Row Most Recent Value  TOC Interventions   TOC Interventions Discussed/Reviewed TOC Interventions Discussed, Arranged PCP follow up less than 12 days/Care Guide scheduled      Interventions Today    Flowsheet Row Most Recent Value  Chronic Disease   Chronic disease during today's visit Chronic Obstructive Pulmonary Disease (COPD)  General Interventions   General Interventions Discussed/Reviewed General Interventions Discussed, Doctor Visits, Durable Medical Equipment (DME), Referral to Nurse  [follow up appt scheduled with assigned RN care coordinator]  Doctor Visits Discussed/Reviewed Doctor Visits Discussed, Specialist, PCP  Durable Medical Equipment (DME) Oxygen  PCP/Specialist Visits Compliance with follow-up visit  Education Interventions   Education Provided Provided Education  Provided Verbal Education On Nutrition, Medication, When to see the doctor, Other  [resp sx mgmt]  Nutrition Interventions   Nutrition Discussed/Reviewed Nutrition Discussed, Adding fruits and vegetables, Decreasing salt, Increasing proteins, Fluid intake  Pharmacy Interventions   Pharmacy Dicussed/Reviewed Pharmacy Topics Discussed, Medications and their functions  Safety Interventions   Safety Discussed/Reviewed Safety Discussed       Alessandra Grout [facility]  Health/[facility] Care Management Care Management Community Coordinator Direct Phone: [phone] Toll Free: [phone] Fax: [phone]

## 2022-10-09 ENCOUNTER — Encounter (HOSPITAL_COMMUNITY): Payer: Self-pay

## 2022-10-09 ENCOUNTER — Other Ambulatory Visit: Payer: Self-pay

## 2022-10-09 ENCOUNTER — Inpatient Hospital Stay (HOSPITAL_COMMUNITY)
Admission: EM | Admit: 2022-10-09 | Discharge: 2022-10-15 | DRG: 191 | Disposition: A | Discharge status: Home Health | Payer: Medicare Other | Attending: Internal Medicine | Admitting: Internal Medicine

## 2022-10-09 ENCOUNTER — Emergency Department (HOSPITAL_COMMUNITY): Payer: Medicare Other

## 2022-10-09 DIAGNOSIS — G4733 Obstructive sleep apnea (adult) (pediatric): Secondary | ICD-10-CM | POA: Diagnosis present

## 2022-10-09 DIAGNOSIS — I5042 Chronic combined systolic (congestive) and diastolic (congestive) heart failure: Secondary | ICD-10-CM | POA: Diagnosis present

## 2022-10-09 DIAGNOSIS — Z885 Allergy status to narcotic agent status: Secondary | ICD-10-CM

## 2022-10-09 DIAGNOSIS — Z7982 Long term (current) use of aspirin: Secondary | ICD-10-CM

## 2022-10-09 DIAGNOSIS — Z515 Encounter for palliative care: Secondary | ICD-10-CM

## 2022-10-09 DIAGNOSIS — F32A Depression, unspecified: Secondary | ICD-10-CM | POA: Diagnosis present

## 2022-10-09 DIAGNOSIS — Z87891 Personal history of nicotine dependence: Secondary | ICD-10-CM

## 2022-10-09 DIAGNOSIS — Z808 Family history of malignant neoplasm of other organs or systems: Secondary | ICD-10-CM

## 2022-10-09 DIAGNOSIS — Z8261 Family history of arthritis: Secondary | ICD-10-CM

## 2022-10-09 DIAGNOSIS — I5022 Chronic systolic (congestive) heart failure: Secondary | ICD-10-CM | POA: Diagnosis not present

## 2022-10-09 DIAGNOSIS — Z711 Person with feared health complaint in whom no diagnosis is made: Secondary | ICD-10-CM | POA: Diagnosis not present

## 2022-10-09 DIAGNOSIS — Z9981 Dependence on supplemental oxygen: Secondary | ICD-10-CM

## 2022-10-09 DIAGNOSIS — I10 Essential (primary) hypertension: Secondary | ICD-10-CM | POA: Diagnosis present

## 2022-10-09 DIAGNOSIS — E785 Hyperlipidemia, unspecified: Secondary | ICD-10-CM | POA: Diagnosis present

## 2022-10-09 DIAGNOSIS — Z66 Do not resuscitate: Secondary | ICD-10-CM | POA: Diagnosis present

## 2022-10-09 DIAGNOSIS — Z823 Family history of stroke: Secondary | ICD-10-CM | POA: Diagnosis not present

## 2022-10-09 DIAGNOSIS — Z1152 Encounter for screening for COVID-19: Secondary | ICD-10-CM

## 2022-10-09 DIAGNOSIS — N1832 Chronic kidney disease, stage 3b: Secondary | ICD-10-CM | POA: Diagnosis present

## 2022-10-09 DIAGNOSIS — N1831 Chronic kidney disease, stage 3a: Secondary | ICD-10-CM | POA: Diagnosis present

## 2022-10-09 DIAGNOSIS — J441 Chronic obstructive pulmonary disease with (acute) exacerbation: Principal | ICD-10-CM | POA: Diagnosis present

## 2022-10-09 DIAGNOSIS — I2510 Atherosclerotic heart disease of native coronary artery without angina pectoris: Secondary | ICD-10-CM | POA: Diagnosis present

## 2022-10-09 DIAGNOSIS — Z8041 Family history of malignant neoplasm of ovary: Secondary | ICD-10-CM

## 2022-10-09 DIAGNOSIS — Z880 Allergy status to penicillin: Secondary | ICD-10-CM

## 2022-10-09 DIAGNOSIS — Z820 Family history of epilepsy and other diseases of the nervous system: Secondary | ICD-10-CM

## 2022-10-09 DIAGNOSIS — J9611 Chronic respiratory failure with hypoxia: Secondary | ICD-10-CM | POA: Diagnosis present

## 2022-10-09 DIAGNOSIS — Z7952 Long term (current) use of systemic steroids: Secondary | ICD-10-CM

## 2022-10-09 DIAGNOSIS — I130 Hypertensive heart and chronic kidney disease with heart failure and stage 1 through stage 4 chronic kidney disease, or unspecified chronic kidney disease: Secondary | ICD-10-CM | POA: Diagnosis present

## 2022-10-09 DIAGNOSIS — F419 Anxiety disorder, unspecified: Secondary | ICD-10-CM | POA: Diagnosis present

## 2022-10-09 DIAGNOSIS — Z91040 Latex allergy status: Secondary | ICD-10-CM

## 2022-10-09 DIAGNOSIS — Z7189 Other specified counseling: Secondary | ICD-10-CM

## 2022-10-09 DIAGNOSIS — Z789 Other specified health status: Secondary | ICD-10-CM

## 2022-10-09 DIAGNOSIS — J4550 Severe persistent asthma, uncomplicated: Secondary | ICD-10-CM

## 2022-10-09 DIAGNOSIS — N179 Acute kidney failure, unspecified: Secondary | ICD-10-CM | POA: Diagnosis present

## 2022-10-09 DIAGNOSIS — D72829 Elevated white blood cell count, unspecified: Secondary | ICD-10-CM | POA: Diagnosis present

## 2022-10-09 DIAGNOSIS — Z79899 Other long term (current) drug therapy: Secondary | ICD-10-CM

## 2022-10-09 DIAGNOSIS — R0603 Acute respiratory distress: Secondary | ICD-10-CM | POA: Diagnosis not present

## 2022-10-09 DIAGNOSIS — Z5986 Financial insecurity: Secondary | ICD-10-CM | POA: Diagnosis not present

## 2022-10-09 DIAGNOSIS — Z801 Family history of malignant neoplasm of trachea, bronchus and lung: Secondary | ICD-10-CM

## 2022-10-09 DIAGNOSIS — Z9049 Acquired absence of other specified parts of digestive tract: Secondary | ICD-10-CM

## 2022-10-09 DIAGNOSIS — R0689 Other abnormalities of breathing: Secondary | ICD-10-CM

## 2022-10-09 DIAGNOSIS — Z7951 Long term (current) use of inhaled steroids: Secondary | ICD-10-CM | POA: Diagnosis not present

## 2022-10-09 DIAGNOSIS — Z833 Family history of diabetes mellitus: Secondary | ICD-10-CM

## 2022-10-09 DIAGNOSIS — R0600 Dyspnea, unspecified: Secondary | ICD-10-CM | POA: Diagnosis not present

## 2022-10-09 DIAGNOSIS — J9612 Chronic respiratory failure with hypercapnia: Secondary | ICD-10-CM | POA: Diagnosis present

## 2022-10-09 DIAGNOSIS — Z8262 Family history of osteoporosis: Secondary | ICD-10-CM

## 2022-10-09 DIAGNOSIS — Z8349 Family history of other endocrine, nutritional and metabolic diseases: Secondary | ICD-10-CM

## 2022-10-09 DIAGNOSIS — R54 Age-related physical debility: Secondary | ICD-10-CM | POA: Diagnosis present

## 2022-10-09 DIAGNOSIS — J449 Chronic obstructive pulmonary disease, unspecified: Secondary | ICD-10-CM

## 2022-10-09 DIAGNOSIS — F1320 Sedative, hypnotic or anxiolytic dependence, uncomplicated: Secondary | ICD-10-CM | POA: Diagnosis present

## 2022-10-09 DIAGNOSIS — Z90710 Acquired absence of both cervix and uterus: Secondary | ICD-10-CM

## 2022-10-09 DIAGNOSIS — Z8249 Family history of ischemic heart disease and other diseases of the circulatory system: Secondary | ICD-10-CM | POA: Diagnosis not present

## 2022-10-09 DIAGNOSIS — I252 Old myocardial infarction: Secondary | ICD-10-CM

## 2022-10-09 DIAGNOSIS — Z888 Allergy status to other drugs, medicaments and biological substances status: Secondary | ICD-10-CM

## 2022-10-09 LAB — I-STAT VENOUS BLOOD GAS, ED
Acid-base deficit: 1 mmol/L (ref 0.0–2.0)
Bicarbonate: 27.3 mmol/L (ref 20.0–28.0)
Calcium, Ion: 1.16 mmol/L (ref 1.15–1.40)
HCT: 37 % (ref 36.0–46.0)
Hemoglobin: 12.6 g/dL (ref 12.0–15.0)
O2 Saturation: 68 %
Potassium: 4.2 mmol/L (ref 3.5–5.1)
Sodium: 137 mmol/L (ref 135–145)
TCO2: 29 mmol/L (ref 22–32)
pCO2, Ven: 63.7 mmHg — ABNORMAL HIGH (ref 44–60)
pH, Ven: 7.241 — ABNORMAL LOW (ref 7.25–7.43)
pO2, Ven: 43 mmHg (ref 32–45)

## 2022-10-09 LAB — COMPREHENSIVE METABOLIC PANEL
ALT: 32 U/L (ref 0–44)
AST: 34 U/L (ref 15–41)
Albumin: 2.9 g/dL — ABNORMAL LOW (ref 3.5–5.0)
Alkaline Phosphatase: 66 U/L (ref 38–126)
Anion gap: 12 (ref 5–15)
BUN: 32 mg/dL — ABNORMAL HIGH (ref 8–23)
CO2: 25 mmol/L (ref 22–32)
Calcium: 9 mg/dL (ref 8.9–10.3)
Chloride: 104 mmol/L (ref 98–111)
Creatinine, Ser: 1.56 mg/dL — ABNORMAL HIGH (ref 0.44–1.00)
GFR, Estimated: 36 mL/min — ABNORMAL LOW (ref >60)
Glucose, Bld: 319 mg/dL — ABNORMAL HIGH (ref 70–99)
Potassium: 4.3 mmol/L (ref 3.5–5.1)
Sodium: 141 mmol/L (ref 135–145)
Total Bilirubin: 0.5 mg/dL (ref 0.3–1.2)
Total Protein: 5.4 g/dL — ABNORMAL LOW (ref 6.5–8.1)

## 2022-10-09 LAB — TROPONIN I (HIGH SENSITIVITY): Troponin I (High Sensitivity): 253 ng/L (ref <18)

## 2022-10-09 LAB — BRAIN NATRIURETIC PEPTIDE: B Natriuretic Peptide: 389.2 pg/mL — ABNORMAL HIGH (ref 0.0–100.0)

## 2022-10-09 LAB — PROCALCITONIN: Procalcitonin: 0.43 ng/mL

## 2022-10-09 MED ORDER — EZETIMIBE 10 MG PO TABS
10.0000 mg | ORAL_TABLET | Freq: Every day | ORAL | Status: DC
  Administered 2022-10-09 – 2022-10-15 (×7): 10 mg via ORAL
  Filled 2022-10-09 (×7): qty 1

## 2022-10-09 MED ORDER — AZITHROMYCIN 500 MG PO TABS
250.0000 mg | ORAL_TABLET | Freq: Every day | ORAL | Status: DC
  Administered 2022-10-09 – 2022-10-15 (×7): 250 mg via ORAL
  Filled 2022-10-09 (×7): qty 1

## 2022-10-09 MED ORDER — ZOLPIDEM TARTRATE 5 MG PO TABS: 5.0000 mg | ORAL_TABLET | Freq: Every evening | ORAL | Status: DC | PRN

## 2022-10-09 MED ORDER — ACETAMINOPHEN 325 MG PO TABS: 650.0000 mg | ORAL_TABLET | Freq: Four times a day (QID) | ORAL | Status: DC | PRN

## 2022-10-09 MED ORDER — ACETAMINOPHEN 650 MG RE SUPP: 650.0000 mg | Freq: Four times a day (QID) | RECTAL | Status: DC | PRN

## 2022-10-09 MED ORDER — MOMETASONE FURO-FORMOTEROL FUM 200-5 MCG/ACT IN AERO
2.0000 | INHALATION_SPRAY | Freq: Two times a day (BID) | RESPIRATORY_TRACT | Status: DC
  Filled 2022-10-09: qty 8.8

## 2022-10-09 MED ORDER — ARFORMOTEROL TARTRATE 15 MCG/2ML IN NEBU
15.0000 ug | INHALATION_SOLUTION | Freq: Two times a day (BID) | RESPIRATORY_TRACT | Status: DC
  Administered 2022-10-09 – 2022-10-15 (×12): 15 ug via RESPIRATORY_TRACT
  Filled 2022-10-09 (×12): qty 2

## 2022-10-09 MED ORDER — IPRATROPIUM-ALBUTEROL 0.5-2.5 (3) MG/3ML IN SOLN
3.0000 mL | Freq: Four times a day (QID) | RESPIRATORY_TRACT | Status: DC
  Administered 2022-10-09: 3 mL via RESPIRATORY_TRACT
  Filled 2022-10-09: qty 3

## 2022-10-09 MED ORDER — SODIUM CHLORIDE 0.9% FLUSH
3.0000 mL | Freq: Two times a day (BID) | INTRAVENOUS | Status: DC
  Administered 2022-10-09 – 2022-10-15 (×12): 3 mL via INTRAVENOUS

## 2022-10-09 MED ORDER — HYDROCODONE-ACETAMINOPHEN 7.5-325 MG PO TABS: 1.0000 | ORAL_TABLET | Freq: Four times a day (QID) | ORAL | Status: DC | PRN

## 2022-10-09 MED ORDER — ONDANSETRON HCL 4 MG/2ML IJ SOLN: 4.0000 mg | Freq: Four times a day (QID) | INTRAMUSCULAR | Status: DC | PRN

## 2022-10-09 MED ORDER — POLYVINYL ALCOHOL 1.4 % OP SOLN: 1.0000 [drp] | Freq: Two times a day (BID) | OPHTHALMIC | Status: DC | PRN

## 2022-10-09 MED ORDER — ALBUTEROL SULFATE (2.5 MG/3ML) 0.083% IN NEBU
2.5000 mg | INHALATION_SOLUTION | RESPIRATORY_TRACT | Status: DC | PRN
  Administered 2022-10-09 – 2022-10-10 (×2): 2.5 mg via RESPIRATORY_TRACT
  Filled 2022-10-09 (×2): qty 3

## 2022-10-09 MED ORDER — ASPIRIN 81 MG PO TBEC
81.0000 mg | DELAYED_RELEASE_TABLET | Freq: Every day | ORAL | Status: DC
  Administered 2022-10-09 – 2022-10-15 (×7): 81 mg via ORAL
  Filled 2022-10-09 (×7): qty 1

## 2022-10-09 MED ORDER — SODIUM CHLORIDE 0.9 % IV SOLN
2.0000 g | INTRAVENOUS | Status: DC
  Administered 2022-10-09: 2 g via INTRAVENOUS
  Filled 2022-10-09: qty 12.5

## 2022-10-09 MED ORDER — HYDROXYZINE HCL 10 MG PO TABS
10.0000 mg | ORAL_TABLET | Freq: Once | ORAL | Status: AC
  Administered 2022-10-09: 10 mg via ORAL
  Filled 2022-10-09: qty 1

## 2022-10-09 MED ORDER — ENOXAPARIN SODIUM 30 MG/0.3ML IJ SOSY
30.0000 mg | PREFILLED_SYRINGE | INTRAMUSCULAR | Status: DC
  Administered 2022-10-09: 30 mg via SUBCUTANEOUS
  Filled 2022-10-09: qty 0.3

## 2022-10-09 MED ORDER — ONDANSETRON HCL 4 MG PO TABS: 4.0000 mg | ORAL_TABLET | Freq: Four times a day (QID) | ORAL | Status: DC | PRN

## 2022-10-09 MED ORDER — PREDNISONE 20 MG PO TABS: 40.0000 mg | ORAL_TABLET | Freq: Every day | ORAL | Status: DC

## 2022-10-09 MED ORDER — LORAZEPAM 2 MG/ML IJ SOLN
0.5000 mg | Freq: Once | INTRAMUSCULAR | Status: AC
  Administered 2022-10-09: 0.5 mg via INTRAVENOUS
  Filled 2022-10-09: qty 1

## 2022-10-09 MED ORDER — PAROXETINE HCL 20 MG PO TABS
40.0000 mg | ORAL_TABLET | Freq: Every day | ORAL | Status: DC
  Administered 2022-10-10 – 2022-10-15 (×6): 40 mg via ORAL
  Filled 2022-10-09 (×7): qty 2

## 2022-10-09 MED ORDER — DOCUSATE SODIUM 100 MG PO CAPS
100.0000 mg | ORAL_CAPSULE | Freq: Two times a day (BID) | ORAL | Status: DC
  Administered 2022-10-09 – 2022-10-15 (×9): 100 mg via ORAL
  Filled 2022-10-09 (×12): qty 1

## 2022-10-09 MED ORDER — LACTATED RINGERS IV SOLN: INTRAVENOUS | Status: AC

## 2022-10-09 MED ORDER — BISACODYL 5 MG PO TBEC: 5.0000 mg | DELAYED_RELEASE_TABLET | Freq: Every day | ORAL | Status: DC | PRN

## 2022-10-09 MED ORDER — POLYETHYLENE GLYCOL 3350 17 G PO PACK: 17.0000 g | PACK | Freq: Every day | ORAL | Status: DC | PRN

## 2022-10-09 MED ORDER — ALBUTEROL (5 MG/ML) CONTINUOUS INHALATION SOLN
15.0000 mg/h | INHALATION_SOLUTION | Freq: Once | RESPIRATORY_TRACT | Status: AC
  Administered 2022-10-09: 15 mg/h via RESPIRATORY_TRACT
  Filled 2022-10-09: qty 20

## 2022-10-09 MED ORDER — BUDESONIDE 0.5 MG/2ML IN SUSP
0.5000 mg | Freq: Two times a day (BID) | RESPIRATORY_TRACT | Status: DC
  Administered 2022-10-09 – 2022-10-15 (×12): 0.5 mg via RESPIRATORY_TRACT
  Filled 2022-10-09 (×12): qty 2

## 2022-10-09 MED ORDER — MAGNESIUM OXIDE 400 MG PO TABS
400.0000 mg | ORAL_TABLET | Freq: Every day | ORAL | Status: DC | PRN
  Administered 2022-10-12: 400 mg via ORAL
  Filled 2022-10-09 (×2): qty 1

## 2022-10-09 MED ORDER — IPRATROPIUM-ALBUTEROL 0.5-2.5 (3) MG/3ML IN SOLN
3.0000 mL | Freq: Three times a day (TID) | RESPIRATORY_TRACT | Status: DC
  Administered 2022-10-09 – 2022-10-14 (×14): 3 mL via RESPIRATORY_TRACT
  Filled 2022-10-09 (×16): qty 3

## 2022-10-09 MED ORDER — ALBUTEROL SULFATE (2.5 MG/3ML) 0.083% IN NEBU
INHALATION_SOLUTION | RESPIRATORY_TRACT | Status: AC
  Filled 2022-10-09: qty 18

## 2022-10-09 MED ORDER — ROSUVASTATIN CALCIUM 20 MG PO TABS
20.0000 mg | ORAL_TABLET | Freq: Every day | ORAL | Status: DC
  Administered 2022-10-09 – 2022-10-15 (×7): 20 mg via ORAL
  Filled 2022-10-09 (×7): qty 1

## 2022-10-09 MED ORDER — GUAIFENESIN ER 600 MG PO TB12
1200.0000 mg | ORAL_TABLET | Freq: Two times a day (BID) | ORAL | Status: DC
  Administered 2022-10-09 – 2022-10-15 (×13): 1200 mg via ORAL
  Filled 2022-10-09 (×13): qty 2

## 2022-10-09 MED ORDER — METHYLPREDNISOLONE SODIUM SUCC 125 MG IJ SOLR
60.0000 mg | Freq: Two times a day (BID) | INTRAMUSCULAR | Status: DC
  Administered 2022-10-09: 60 mg via INTRAVENOUS
  Filled 2022-10-09: qty 2

## 2022-10-09 MED ORDER — SPIRONOLACTONE 12.5 MG HALF TABLET
12.5000 mg | ORAL_TABLET | Freq: Every day | ORAL | Status: DC
  Administered 2022-10-09 – 2022-10-15 (×7): 12.5 mg via ORAL
  Filled 2022-10-09 (×7): qty 1

## 2022-10-09 MED ORDER — ALPRAZOLAM 0.25 MG PO TABS
0.5000 mg | ORAL_TABLET | Freq: Four times a day (QID) | ORAL | Status: DC
  Administered 2022-10-09 – 2022-10-14 (×14): 0.5 mg via ORAL
  Filled 2022-10-09 (×15): qty 2

## 2022-10-09 MED ORDER — HYDRALAZINE HCL 20 MG/ML IJ SOLN: 5.0000 mg | INTRAMUSCULAR | Status: DC | PRN

## 2022-10-09 NOTE — ED Notes (Signed)
Could not take pt's axillary temp on pt. Notified RN.

## 2022-10-09 NOTE — ED Provider Notes (Signed)
[place] EMERGENCY DEPARTMENT AT [facility] Provider Note   CSN: [id number] Arrival date & time: 10/09/22  4098     History  Chief Complaint  Patient presents with   Shortness of Breath    Kim [name] is a 67 y.o. female.  Pt with hx of COPD and chronic hypoxic respiratory failure on home O2, OSA on CPAP who has had multiple hospitalizations in the last 30 days most recently she was discharged 2 days ago after COPD exacerbation where she improved after nebs, steroids and Zithromax who is presenting today due to shortness of breath.  Patient states that since going home she is just had worsening shortness of breath and this morning could not catch her breath at all.  She denies productive cough, fever, chest pain or abdominal pain.  She reports this feels similar to her prior COPD attacks.  She was given IM epi, Solu-Medrol, magnesium and DuoNebs by EMS prior to arrival and then was started on CPAP due to mottling of her skin and work of breathing.  The history is provided by the EMS personnel, the patient and medical records. The history is limited by the condition of the patient.  Shortness of Breath      Home Medications Prior to Admission medications   Medication Sig Start Date End Date Taking? Authorizing Provider  albuterol (VENTOLIN HFA) 108 (90 Base) MCG/ACT inhaler INHALE 2 PUFFS INTO THE LUNGS EVERY 4 TO 6 HOURS AS NEEDED FOR SHORTNESS OF BREATH OR WHEEZING Patient taking differently: Inhale 2 puffs into the lungs See admin instructions. Inhale 2 puffs every 4-6 hours as needed for wheezing or shortness or breath. 07/23/22   Cobb, Ruby Cola, NP  ALPRAZolam Prudy Feeler) 0.5 MG tablet Take 1 tablet (0.5 mg total) by mouth every 6 (six) hours. 09/24/22   Nafziger, Kandee Keen, NP  aspirin EC 81 MG EC tablet Take 1 tablet (81 mg total) by mouth daily. 07/08/15   Rhetta Mura, MD  bisoprolol (ZEBETA) 5 MG tablet Take 0.5 tablets (2.5 mg total) by mouth daily as needed  (Take HALF tablet by mouth for break through chest pain as needed). Patient taking differently: Take 2.5 mg by mouth daily. 10/03/22   Kathleene Hazel, MD  budesonide-formoterol (SYMBICORT) 160-4.5 MCG/ACT inhaler Inhale 2 puffs into the lungs 2 (two) times daily. 07/15/22   Oretha Milch, MD  butalbital-acetaminophen-caffeine (FIORICET) [phone] MG tablet Take 1 tablet by mouth every 4 (four) hours as needed for headache. TAKE 1 TABLET BY MOUTH EVERY 4 HOURS AS NEEDED FOR HEADACHETAKE 1 TABLET BY MOUTH EVERY 4 HOURS AS NEEDED FOR HEADACHE 09/24/22   Nafziger, Kandee Keen, NP  Carboxymethylcellulose Sodium (ARTIFICIAL TEARS OP) Place 1 drop into both eyes 2 (two) times daily as needed (dryness, irritation).    [provider]  cetirizine (ZYRTEC) 10 MG tablet Take 10 mg by mouth daily.    [provider]  Cholecalciferol (VITAMIN D3 PO) Take 5,000 Units by mouth daily.    [provider]  Cyanocobalamin (VITAMIN B-12 PO) Take 1 tablet by mouth daily.    [provider]  dapagliflozin propanediol (FARXIGA) 10 MG TABS tablet Take 1 tablet (10 mg total) by mouth daily. 09/19/22   Arrien, York Ram, MD  ezetimibe (ZETIA) 10 MG tablet TAKE 1 TABLET(10 MG) BY MOUTH DAILY Patient taking differently: Take 10 mg by mouth daily. 01/14/22   Nafziger, Kandee Keen, NP  furosemide (LASIX) 40 MG tablet Take 1 tablet (40 mg total) by mouth  daily. Patient taking differently: Take 40 mg by mouth daily as needed for fluid. 04/14/22   Kathleene Hazel, MD  HYDROcodone-acetaminophen (NORCO) 7.5-325 MG tablet Take 1 tablet by mouth every 6 (six) hours as needed for moderate pain. 08/05/22   Nafziger, Kandee Keen, NP  ipratropium-albuterol (DUONEB) 0.5-2.5 (3) MG/3ML SOLN USE 3 ML VIA NEBULIZER EVERY 6 HOURS AS NEEDED Patient taking differently: Take 3 mLs by nebulization every 6 (six) hours as needed (wheezing, shortness of breath). 05/16/22   Oretha Milch, MD  magnesium oxide (MAG-OX) 400 MG  tablet Take 400 mg by mouth daily as needed (constipation).    [provider]  MUCINEX MAXIMUM STRENGTH 1200 MG TB12 Take 1,200 mg by mouth every 12 (twelve) hours.    [provider]  nitroGLYCERIN (NITROSTAT) 0.4 MG SL tablet Place 1 tablet (0.4 mg total) under the tongue every 5 (five) minutes as needed for chest pain. 10/24/20   Kathleene Hazel, MD  nystatin (MYCOSTATIN) 100000 UNIT/ML suspension Take 5 mLs (500,000 Units total) by mouth 4 (four) times daily. 09/24/22   Nafziger, Kandee Keen, NP  ondansetron (ZOFRAN) 4 MG tablet Take 1 tablet (4 mg total) by mouth every 8 (eight) hours as needed for nausea or vomiting. 09/05/21   Nafziger, Kandee Keen, NP  OXYGEN Inhale 2-3 L/min into the lungs continuous.    [provider]  PARoxetine (PAXIL) 40 MG tablet TAKE 1 TABLET(40 MG) BY MOUTH EVERY MORNING Patient taking differently: Take 40 mg by mouth daily. 09/30/22   Nafziger, Kandee Keen, NP  polyethylene glycol powder (GLYCOLAX/MIRALAX) 17 GM/SCOOP powder Take 17 g by mouth daily as needed for moderate constipation (constipation).    [provider]  potassium chloride (KLOR-CON) 10 MEQ tablet Take 1 tablet (10 mEq total) by mouth as needed (take with furosemide (lasix)). Patient taking differently: Take 10 mEq by mouth daily as needed (with furosemide.). 06/05/22   Kathleene Hazel, MD  predniSONE (DELTASONE) 10 MG tablet Take 4 tablets daily for 2days, then take 3 tablets daily for 2 days, then take 2 tablets daily for 2 days, then go back to your usual regimen (5 mg on Monday Wednesday Friday, 10 mg Tuesdays, Thursdays, Saturdays and Sundays) 10/07/22   Ghimire, Werner Lean, MD  rosuvastatin (CRESTOR) 20 MG tablet Take 1 tablet (20 mg total) by mouth daily. 07/14/22   Kathleene Hazel, MD  Spacer/Aero Chamber Mouthpiece MISC 1 Device by Does not apply route as directed. 01/12/17   Roslynn Amble, MD  spironolactone (ALDACTONE) 25 MG tablet Take 0.5 tablets (12.5 mg  total) by mouth daily. 09/19/22 10/19/22  Arrien, York Ram, MD      Allergies    Tape, Daliresp [roflumilast], Cozaar [losartan], Dupixent [dupilumab], Latex, Morphine and codeine, Penicillins, Statins, Zestril [lisinopril], and Omnicef [cefdinir]    Review of Systems   Review of Systems  Respiratory:  Positive for shortness of breath.     Physical Exam Updated Vital Signs BP 129/64   Pulse 89   Resp (!) 22   Ht 5\' 1"  (1.549 m)   Wt 63.5 kg   SpO2 100%   BMI 26.45 kg/m  Physical Exam Vitals and nursing note reviewed.  Constitutional:      General: She is in acute distress.     Appearance: She is well-developed. She is ill-appearing and diaphoretic.  HENT:     Head: Normocephalic and atraumatic.     Mouth/Throat:     Mouth: Mucous membranes are dry.  Eyes:  Pupils: Pupils are equal, round, and reactive to light.  Cardiovascular:     Rate and Rhythm: Normal rate and regular rhythm.     Heart sounds: Normal heart sounds. No murmur heard.    No friction rub.  Pulmonary:     Effort: Pulmonary effort is normal. Tachypnea present.     Breath sounds: Decreased air movement present. Decreased breath sounds and wheezing present. No rales.     Comments: Diffuse expiratory wheezing but diminished breath sounds throughout Abdominal:     General: Bowel sounds are normal. There is no distension.     Palpations: Abdomen is soft.     Tenderness: There is no abdominal tenderness. There is no guarding or rebound.  Musculoskeletal:        General: No tenderness. Normal range of motion.     Right lower leg: No edema.     Left lower leg: No edema.     Comments: No edema  Skin:    General: Skin is warm.     Findings: No rash.     Comments: Slightly mottled skin over the chest  Neurological:     Mental Status: She is alert and oriented to person, place, and time. Mental status is at baseline.     Cranial Nerves: No cranial nerve deficit.  Psychiatric:        Behavior:  Behavior normal.     ED Results / Procedures / Treatments   Labs (all labs ordered are listed, but only abnormal results are displayed) Labs Reviewed  COMPREHENSIVE METABOLIC PANEL - Abnormal; Notable for the following components:      Result Value   Glucose, Bld 319 (*)    BUN 32 (*)    Creatinine, Ser 1.56 (*)    Total Protein 5.4 (*)    Albumin 2.9 (*)    GFR, Estimated 36 (*)    All other components within normal limits  BRAIN NATRIURETIC PEPTIDE - Abnormal; Notable for the following components:   B Natriuretic Peptide 389.2 (*)    All other components within normal limits  I-STAT VENOUS BLOOD GAS, ED - Abnormal; Notable for the following components:   pH, Ven 7.241 (*)    pCO2, Ven 63.7 (*)    All other components within normal limits  CBC WITH DIFFERENTIAL/PLATELET  TROPONIN I (HIGH SENSITIVITY)  TROPONIN I (HIGH SENSITIVITY)    EKG EKG Interpretation Date/Time:  Thursday October 09 2022 09:41:28 EDT Ventricular Rate:  87 PR Interval:    QRS Duration:  112 QT Interval:  342 QTC Calculation: 412 R Axis:   -75  Text Interpretation: new Accelerated junctional rhythm LAD, consider left anterior fascicular block Anteroseptal infarct, age indeterminate Confirmed by Gwyneth Sprout ([id number]) on 10/09/2022 9:46:18 AM  Radiology DG Chest Portable 1 View  Result Date: 10/09/2022 CLINICAL DATA:  Shortness of breath EXAM: PORTABLE CHEST 1 VIEW COMPARISON:  Chest x-ray dated October 04, 2022 FINDINGS: Cardiac and mediastinal contours are unchanged. Emphysema. Mild bibasilar opacities likely due to scarring or atelectasis. No focal consolidation. No evidence of pneumothorax. No evidence of pleural effusion. IMPRESSION: Emphysema. No focal consolidation. Electronically Signed   By: Allegra Lai M.D.   On: 10/09/2022 10:04    Procedures Procedures    Medications Ordered in ED Medications  albuterol (PROVENTIL) (2.5 MG/3ML) 0.083% nebulizer solution (has no administration in time  range)  albuterol (PROVENTIL,VENTOLIN) solution continuous neb (15 mg/hr Nebulization Given 10/09/22 1023)  LORazepam (ATIVAN) injection 0.5 mg (0.5 mg Intravenous Given 10/09/22 1010)  ED Course/ Medical Decision Making/ A&P                             Medical Decision Making Amount and/or Complexity of Data Reviewed Independent Historian: spouse External Data Reviewed: notes. Labs: ordered. Decision-making details documented in ED Course. Radiology: ordered and independent interpretation performed. Decision-making details documented in ED Course. ECG/medicine tests: ordered and independent interpretation performed. Decision-making details documented in ED Course.  Risk Prescription drug management. Decision regarding hospitalization.   Pt with multiple medical problems and comorbidities and presenting today with a complaint that caries a high risk for morbidity and mortality.  Here today with recurrent shortness of breath.  Patient was in respiratory distress when EMS arrived and she received IM epi Solu-Medrol, magnesium and DuoNebs.  Upon arrival here patient was on CPAP due to work of breathing.  Patient is currently on BiPAP and is awake and alert.  She is able to answer questions but appears short of breath.  She is still wheezing on exam.  She has no evidence of fluid overload on exam and during recent hospitalization they felt that she was euvolemic.  Patient is only been home for a day and a half.  She was currently weaning down on her prednisone back to her baseline and does wear oxygen at home.  She denies any chest pain or abdominal pain reports this feels like her similar attacks.  Will give a continuous albuterol nebulizer.  Patient has no unilateral leg pain or swelling, no prior history of PE or DVT but does have a history of NSTEMI at the beginning of the month most likely secondary to respiratory cause. I independently interpreted patient's EKG and labs.  EKG showed a  tachycardic rhythm difficult to determine however while in the room as patient is on continuous cardiac monitoring she is in a sinus rhythm with a rate of 90.  There were no ST changes concerning for MI today.  VBG today with mild respiratory acidosis with a pH of 7.24 and a CO2 of 63. CMP without acute findings and BNP iimproved from prior.  I have independently visualized and interpreted pt's images today.  Chest x-ray is a poor inspiratory film but there is no acute infiltrates, pneumothorax or cardiomegaly.  Low suspicion for tamponade at this time, pneumonia.  11:38 AM Pt feeling better but still wheezing after CAT.  Pt wanting to wean off bipap.  Will trial off but given above sx feel pt needs admission.  Discussed with pt and her husband and they are comfortable with this plan.  Hospitalist consulted for admission.  CRITICAL CARE Performed by: Cherlynn Popiel Total critical care time: 40 minutes Critical care time was exclusive of separately billable procedures and treating other patients. Critical care was necessary to treat or prevent imminent or life-threatening deterioration. Critical care was time spent personally by me on the following activities: development of treatment plan with patient and/or surrogate as well as nursing, discussions with consultants, evaluation of patient's response to treatment, examination of patient, obtaining history from patient or surrogate, ordering and performing treatments and interventions, ordering and review of laboratory studies, ordering and review of radiographic studies, pulse oximetry and re-evaluation of patient's condition.           Final Clinical Impression(s) / ED Diagnoses Final diagnoses:  COPD exacerbation (HCC)  Respiratory distress  Hypercarbia    Rx / DC Orders ED Discharge Orders  None         Gwyneth Sprout, MD 10/09/22 [phone]

## 2022-10-09 NOTE — Consult Note (Signed)
NAME:  Kim [name], MRN:  [id number], DOB:  26-Feb-1956, LOS: 0 ADMISSION DATE:  10/09/2022, CONSULTATION DATE:  10/09/22 REFERRING MD:  Dr. 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    Patient followed in our office by Dr. Vassie Loll.  Last PFTs 2016 with FVC 63%,  FEV1 41%, negative bronchodilator response, DLCO uncorrected 51%; has refused PFTs since cause she doesn't want to see how bad it is.  Unfortunately this is her third admission for AECOPD since June 2024, but prior to that made it one year without hospitalizations.  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  Admitted again 6/29 to 7/2 after issues with her air conditioner going out a home.  Pt reports did not feel back to her baseline at 7/2 prior to going home.  Reports compliance with her home symbicort, duonebs, NIV, and taking her prednisone, scheduled to decrease to 30mg  today.  Used NIV for around 6hrs last night but woke up not feeling well, denies orthopnea, followed by severe shortness of breath sitting up despite taking xanax and nebs x 2 without relief, husband called EMS.  She was found to be in severe respiratory distress, treated with CPAP, magnesium, epi IM, solumedrol, and nebs.  She denies any recent fever or chills, sputum production, LE edema, or sick exposures.  She reports worsening exercise tolerance related to her SOB.   In [place], she was placed on BiPAP and given nebs, empiric abx, and ativan 0.5mg .  CXR showing bibasilar opacities, most likely atelectasis but afebrile, and on her baseline 2-3 O2 [place].  She since has been able to transition off BiPAP.  Palliative care consulted but patient was not ready to discuss or want to consider adding new medications given previous  history of multiple reactions with different meds however confirms her DNR/ DNI status.  Pulmonary consulted given recurrent AECOPD exacerbations.  Pertinent  Medical History  Former tobacco abuse, COPD/ asthma, chronic hypoxic/ hypercarbic respiratory failure on HOT 2L and NIV, HFpEF, takotsubo cardiomyopathy, CAD, HTN, anxiety/ depression, SAH  Significant Hospital Events: Including procedures, antibiotic start and stop dates in addition to other pertinent events   7/2 discharged home 7/4 readmitted   Interim History / Subjective:  Asking for her albuterol HFA just in case at bedside.  Overall feels better but still having significant anxiety.  Feels like the ativan made her hallucinate/ not remember.   Objective   Blood pressure 115/64, pulse 90, temperature 97.9 F (36.6 C), temperature source Oral, resp. rate 19, height 5\' 1"  (1.549 m), weight 63.5 kg, SpO2 98 %.    FiO2 (%):  [40 %] 40 %  No intake or output data in the 24 hours ending 10/09/22 1514 Filed Weights   10/09/22 0935  Weight: 63.5 kg   Examination: General:  chronically ill appearing older female sitting upright in bed, initially no distress, husband at bedside Neuro: awake, oriented x 3 and seems appropriate but also mildly confused at times, MAE CV: rr, no murmur PULM:  non labored at rest but has conversational dyspnea and becomes very anxious and tremulous at times, severely diminished, no wheeze, some bibasilar rales GI: soft, bs+, NT Extremities: warm/dry, no LE edema  Skin: no rashes  Resolved Hospital Problem  list    Assessment & Plan:   Recurrent AECOPD with suspected asthma overlap, end-stage Chronic hypoxic and hypercarbic respiratory failure on HOT 2-3L and home NIV P:  - just discharged 7/2 for same.  Reported compliance with NIV, O2, nebs, and prednisone (on 40mg  at home yesterday).  No change in cough/ secretions, denies fever chills but PCT is 0.43 Suspect her severe anxiety is a large  contribution to her exacerbations and ongoing BB/ bisoprolol could be contributing.   - agree with holding BB, would avoid restarting if able - remains DNR/ DNI.  Seen by PMT.  Encouraged pt to reconsider speaking with them for symptom management.  Will trial one time dose of hydroxyzine to see if helps with anxiety as she is very hesitant to try new medications; did not like ativan. Cont xanax per primary team.   - cont BiPAP prn and mandatory at bedtime - change dulera to brovana/ pulmicort for now - cont duonebs scheduled, and albuterol prn (prefer nebs for now/ acute phase) - cont with prednisone 40mg  daily.  Will need a very slow taper, steroid dependent at baseline 10mg  alternating 5mg  - cont cefepime for now, given PCT.  Leukocytosis could be reactive from prednisone.  Recheck PCT in am.  - start azithromycin daily.  Ideally needs empiric MWF azithro at discharge - cont aggressive pulmonary hygiene- IS, flutter, guaifenesin - PT consult     Remainder per primary team.  PCCM will continue to follow.    Best Practice (right click and "Reselect all SmartList Selections" daily)   Diet/type: Regular consistency (see orders) DVT prophylaxis: LMWH GI prophylaxis: PPI Lines: N/A Foley:  N/A Code Status:  DNR Last date of multidisciplinary goals of care discussion per primary   Labs   CBC: Recent Labs  Lab 10/04/22 1200 10/05/22 0408 10/06/22 0552 10/09/22 0945  WBC 15.3* 11.3* 19.6*  --   NEUTROABS  --   --  16.5*  --   HGB 14.5 13.0 11.9* 12.6  HCT 46.4* 41.6 36.7 37.0  MCV 99.4 97.4 95.8  --   PLT 225 220 236  --     Basic Metabolic Panel: Recent Labs  Lab 10/04/22 1200 10/05/22 0408 10/06/22 0552 10/09/22 0939 10/09/22 0945  NA 140 138 138 141 137  K 3.7 3.8 4.0 4.3 4.2  CL 98 97* 99 104  --   CO2 28 29 28 25   --   GLUCOSE 161* 222* 138* 319*  --   BUN 27* 27* 28* 32*  --   CREATININE 1.23* 1.27* 1.26* 1.56*  --   CALCIUM 10.3 10.6* 9.9 9.0  --     GFR: Estimated Creatinine Clearance: 29.5 mL/min (A) (by C-G formula based on SCr of 1.56 mg/dL (H)). Recent Labs  Lab 10/04/22 1200 10/05/22 0408 10/06/22 0552 10/09/22 1241  PROCALCITON  --   --   --  0.43  WBC 15.3* 11.3* 19.6*  --     Liver Function Tests: Recent Labs  Lab 10/04/22 1200 10/09/22 0939  AST 23 34  ALT 29 32  ALKPHOS 69 66  BILITOT 0.8 0.5  PROT 6.4* 5.4*  ALBUMIN 3.6 2.9*   No results for input(s): "LIPASE", "AMYLASE" in the last 168 hours. No results for input(s): "AMMONIA" in the last 168 hours.  ABG    Component Value Date/Time   PHART 7.43 09/11/2022 1030   PCO2ART 48 09/11/2022 1030   PO2ART 89 09/11/2022 1030   HCO3 27.3 10/09/2022 0945   TCO2  29 10/09/2022 0945   ACIDBASEDEF 1.0 10/09/2022 0945   O2SAT 68 10/09/2022 0945     Coagulation Profile: No results for input(s): "INR", "PROTIME" in the last 168 hours.  Cardiac Enzymes: No results for input(s): "CKTOTAL", "CKMB", "CKMBINDEX", "TROPONINI" in the last 168 hours.  HbA1C: Hgb A1c MFr Bld  Date/Time Value Ref Range Status  07/14/2022 04:05 PM 6.1 (H) 4.8 - 5.6 % Final    Comment:             Prediabetes: 5.7 - 6.4          Diabetes: >6.4          Glycemic control for adults with diabetes: <7.0   03/22/2018 07:57 AM 6.0 4.6 - 6.5 % Final    Comment:    Glycemic Control Guidelines for People with Diabetes:Non Diabetic:  <6%Goal of Therapy: <7%Additional Action Suggested:  >8%     CBG: No results for input(s): "GLUCAP" in the last 168 hours.  Review of Systems:   ROS per HPI otherwise negative.   Past Medical History:  She,  has a past medical history of C8 RADICULOPATHY (06/05/2009), COPD (08/31/2007), DEPRESSION (11/19/2006), DYSLIPIDEMIA (03/12/2009), MI (mitral incompetence), NEPHROLITHIASIS (01/05/2008), OSTEOARTHRITIS (06/05/2009), PARESTHESIA (08/24/2007), and TOBACCO ABUSE (01/25/2010).   Surgical History:   Past Surgical History:  Procedure Laterality Date    ABDOMINAL HYSTERECTOMY     APPENDECTOMY  1969   CARDIAC CATHETERIZATION N/A 07/05/2015   Procedure: Left Heart Cath and Coronary Angiography;  Surgeon: Marykay Lex, MD;  [facility] INVASIVE CV LAB;  Service: Cardiovascular;  Laterality: N/A;   CHOLECYSTECTOMY  1989   collapse lung  1990   COLONOSCOPY WITH PROPOFOL N/A 05/14/2018   Procedure: COLONOSCOPY WITH PROPOFOL;  Surgeon: Hilarie Fredrickson, MD;  Location: [facility];  Service: Endoscopy;  Laterality: N/A;   EXCISION MASS ABDOMINAL  2019   EXPLORATORY LAPAROTOMY  1969   LEFT HEART CATH AND CORONARY ANGIOGRAPHY N/A 11/19/2020   Procedure: LEFT HEART CATH AND CORONARY ANGIOGRAPHY;  Surgeon: Yvonne Kendall, MD;  Location: [facility] INVASIVE CV LAB;  Service: Cardiovascular;  Laterality: N/A;   POLYPECTOMY  05/14/2018   Procedure: POLYPECTOMY;  Surgeon: Hilarie Fredrickson, MD;  Location: [facility]OS[facility];  Service: Endoscopy;;   SALPINGOOPHORECTOMY  2019     Social History:   reports that she quit smoking about 8 years ago. Her smoking use included cigarettes. She has a 76.00 pack-year smoking history. She has never used smokeless tobacco. She reports that she does not drink alcohol and does not use drugs.   Family History:  Her family history includes Alcohol abuse in an other family member; Arthritis in an other family member; Cancer in her mother and another family member; Diabetes in her sister and another family member; Heart attack in her maternal uncle, mother, and other family members; Heart disease in an other family member; Hypertension in her brother and sister; Kidney disease in an other family member; Melanoma in an other family member; Multiple sclerosis in her brother and another family member; Osteoporosis in an other family member; Rheum arthritis in her sister; Stroke in her maternal grandmother and another family member; Thyroid disease in her sister. There is no history of Colon cancer.   Allergies Allergies  Allergen Reactions   Tape  Other (See Comments)    Skin tears easily, peels skin off   Daliresp [Roflumilast] Other (See Comments)    Skin peeling   Cozaar [Losartan] Other (See Comments)    Possible cause  of severe hypotension   Dupixent [Dupilumab] Diarrhea, Swelling and Cough    Facial swelling Headache   Latex Other (See Comments)    Peels skin off   Morphine And Codeine Other (See Comments)    Hallucinations    Penicillins Hives   Statins Other (See Comments)    Myalgia    Zestril [Lisinopril] Cough   Omnicef [Cefdinir] Rash     Home Medications  Prior to Admission medications   Medication Sig Start Date End Date Taking? Authorizing Provider  albuterol (VENTOLIN HFA) 108 (90 Base) MCG/ACT inhaler INHALE 2 PUFFS INTO THE LUNGS EVERY 4 TO 6 HOURS AS NEEDED FOR SHORTNESS OF BREATH OR WHEEZING Patient taking differently: Inhale 2 puffs into the lungs See admin instructions. Inhale 2 puffs every 4-6 hours as needed for wheezing or shortness or breath. 07/23/22  Yes Cobb, Ruby Cola, NP  ALPRAZolam Prudy Feeler) 0.5 MG tablet Take 1 tablet (0.5 mg total) by mouth every 6 (six) hours. 09/24/22  Yes Nafziger, Kandee Keen, NP  aspirin EC 81 MG EC tablet Take 1 tablet (81 mg total) by mouth daily. 07/08/15  Yes Rhetta Mura, MD  bisoprolol (ZEBETA) 5 MG tablet Take 0.5 tablets (2.5 mg total) by mouth daily as needed (Take HALF tablet by mouth for break through chest pain as needed). Patient taking differently: Take 2.5 mg by mouth daily. 10/03/22  Yes Kathleene Hazel, MD  budesonide-formoterol [facility]) 160-4.5 MCG/ACT inhaler Inhale 2 puffs into the lungs 2 (two) times daily. 07/15/22  Yes Oretha Milch, MD  butalbital-acetaminophen-caffeine (FIORICET) [phone] MG tablet Take 1 tablet by mouth every 4 (four) hours as needed for headache. TAKE 1 TABLET BY MOUTH EVERY 4 HOURS AS NEEDED FOR HEADACHETAKE 1 TABLET BY MOUTH EVERY 4 HOURS AS NEEDED FOR HEADACHE 09/24/22  Yes Nafziger, Kandee Keen, NP  Carboxymethylcellulose  Sodium (ARTIFICIAL TEARS OP) Place 1 drop into both eyes 2 (two) times daily as needed (dryness, irritation).   Yes [provider]  cetirizine (ZYRTEC) 10 MG tablet Take 10 mg by mouth daily.   Yes [provider]  Cholecalciferol (VITAMIN D3 PO) Take 5,000 Units by mouth daily.   Yes [provider]  Cyanocobalamin (VITAMIN B-12 PO) Take 1 tablet by mouth daily.   Yes [provider]  ezetimibe (ZETIA) 10 MG tablet TAKE 1 TABLET(10 MG) BY MOUTH DAILY Patient taking differently: Take 10 mg by mouth daily. 01/14/22  Yes Nafziger, Kandee Keen, NP  furosemide (LASIX) 40 MG tablet Take 1 tablet (40 mg total) by mouth daily. Patient taking differently: Take 40 mg by mouth daily as needed for fluid. 04/14/22  Yes Kathleene Hazel, MD  HYDROcodone-acetaminophen (NORCO) 7.5-325 MG tablet Take 1 tablet by mouth every 6 (six) hours as needed for moderate pain. 08/05/22  Yes Nafziger, Kandee Keen, NP  ipratropium-albuterol (DUONEB) 0.5-2.5 (3) MG/3ML SOLN USE 3 ML VIA NEBULIZER EVERY 6 HOURS AS NEEDED Patient taking differently: Take 3 mLs by nebulization every 6 (six) hours as needed (wheezing, shortness of breath). 05/16/22  Yes Oretha Milch, MD  magnesium oxide (MAG-OX) 400 MG tablet Take 400 mg by mouth daily as needed (constipation).   Yes [provider]  MUCINEX MAXIMUM STRENGTH 1200 MG TB12 Take 1,200 mg by mouth every 12 (twelve) hours.   Yes [provider]  nitroGLYCERIN (NITROSTAT) 0.4 MG SL tablet Place 1 tablet (0.4 mg total) under the tongue every 5 (five) minutes as needed for chest pain. 10/24/20  Yes Kathleene Hazel, MD  nystatin (  MYCOSTATIN) 100000 UNIT/ML suspension Take 5 mLs (500,000 Units total) by mouth 4 (four) times daily. 09/24/22  Yes Nafziger, Kandee Keen, NP  ondansetron (ZOFRAN) 4 MG tablet Take 1 tablet (4 mg total) by mouth every 8 (eight) hours as needed for nausea or vomiting. 09/05/21  Yes Nafziger, Kandee Keen, NP  OXYGEN Inhale 2-3 L/min  into the lungs continuous.   Yes [provider]  PARoxetine (PAXIL) 40 MG tablet TAKE 1 TABLET(40 MG) BY MOUTH EVERY MORNING Patient taking differently: Take 40 mg by mouth daily. 09/30/22  Yes Nafziger, Kandee Keen, NP  polyethylene glycol powder (GLYCOLAX/MIRALAX) 17 GM/SCOOP powder Take 17 g by mouth daily as needed for moderate constipation (constipation).   Yes [provider]  potassium chloride (KLOR-CON) 10 MEQ tablet Take 1 tablet (10 mEq total) by mouth as needed (take with furosemide (lasix)). Patient taking differently: Take 10 mEq by mouth daily as needed (with furosemide.). 06/05/22  Yes Kathleene Hazel, MD  predniSONE (DELTASONE) 10 MG tablet Take 4 tablets daily for 2days, then take 3 tablets daily for 2 days, then take 2 tablets daily for 2 days, then go back to your usual regimen (5 mg on Monday Wednesday Friday, 10 mg Tuesdays, Thursdays, Saturdays and Sundays) 10/07/22  Yes Ghimire, Werner Lean, MD  rosuvastatin (CRESTOR) 20 MG tablet Take 1 tablet (20 mg total) by mouth daily. 07/14/22  Yes Kathleene Hazel, MD  spironolactone (ALDACTONE) 25 MG tablet Take 0.5 tablets (12.5 mg total) by mouth daily. 09/19/22 10/19/22  Arrien, York Ram, MD     Critical care time: n/a       Posey Boyer, MSN, NP, AG-ACNP-BC [facility] 10/09/2022, 3:15 PM  See Amion for pager If no response to pager , please call [phone] until 7pm After 7:00 pm call Elink  [phone]

## 2022-10-09 NOTE — ED Notes (Signed)
ED TO INPATIENT HANDOFF REPORT  ED Nurse Name and Phone #: Tori [phone]  S Name/Age/Gender Berton Mount 67 y.o. female Room/Bed: 032C/032C  Code Status   Code Status: Prior  Home/SNF/Other Home Patient oriented to: self, place, time, and situation Is this baseline? Yes      Chief Complaint COPD with acute exacerbation (HCC) [J44.1]  Triage Note Pt BIB EMS due to [facility]. Pt used inhalers, belly breathing. Hx of COPD. EMS gave IM epi, solumedrol, mag, and duoneb. Axox4.    Allergies Allergies  Allergen Reactions   Tape Other (See Comments)    Skin tears easily, peels skin off   Daliresp [Roflumilast] Other (See Comments)    Skin peeling   Cozaar [Losartan] Other (See Comments)    Possible cause of severe hypotension   Dupixent [Dupilumab] Diarrhea, Swelling and Cough    Facial swelling Headache   Latex Other (See Comments)    Peels skin off   Morphine And Codeine Other (See Comments)    Hallucinations    Penicillins Hives   Statins Other (See Comments)    Myalgia    Zestril [Lisinopril] Cough   Omnicef [Cefdinir] Rash    Level of Care/Admitting Diagnosis ED Disposition     ED Disposition  Admit   Condition  --   Comment  Hospital Area: [facility] [[id number]]  Level of Care: Telemetry Medical [104]  May admit patient to [facility] or [facility] if equivalent level of care is available:: Yes  Covid Evaluation: Asymptomatic - no recent exposure (last 10 days) testing not required  Diagnosis: COPD with acute exacerbation [facility]) [[id number]]  Admitting Physician: Jonah Blue [[id number]]  Attending Physician: Jonah Blue [[id number]]  Certification:: I certify this patient will need inpatient services for at least 2 midnights  Estimated Length of Stay: 3          B Medical/Surgery History 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   COPD 08/31/2007   DEPRESSION 11/19/2006   DYSLIPIDEMIA 03/12/2009   MI (mitral  incompetence)    NEPHROLITHIASIS 01/05/2008   OSTEOARTHRITIS 06/05/2009   PARESTHESIA 08/24/2007   TOBACCO ABUSE 01/25/2010   Past Surgical History:  Procedure Laterality Date   ABDOMINAL HYSTERECTOMY     APPENDECTOMY  1969   CARDIAC CATHETERIZATION N/A 07/05/2015   Procedure: Left Heart Cath and Coronary Angiography;  Surgeon: Marykay Lex, MD;  Location: [facility] INVASIVE CV LAB;  Service: Cardiovascular;  Laterality: N/A;   CHOLECYSTECTOMY  1989   collapse lung  1990   COLONOSCOPY WITH PROPOFOL N/A 05/14/2018   Procedure: COLONOSCOPY WITH PROPOFOL;  Surgeon: Hilarie Fredrickson, MD;  Location: [facility];  Service: Endoscopy;  Laterality: N/A;   EXCISION MASS ABDOMINAL  2019   EXPLORATORY LAPAROTOMY  1969   LEFT HEART CATH AND CORONARY ANGIOGRAPHY N/A 11/19/2020   Procedure: LEFT HEART CATH AND CORONARY ANGIOGRAPHY;  Surgeon: Yvonne Kendall, MD;  Location: [facility] INVASIVE CV LAB;  Service: Cardiovascular;  Laterality: N/A;   POLYPECTOMY  05/14/2018   Procedure: POLYPECTOMY;  Surgeon: Hilarie Fredrickson, MD;  Location: [facility];  Service: Endoscopy;;   SALPINGOOPHORECTOMY  2019     A IV Location/Drains/Wounds Patient Lines/Drains/Airways Status     Active Line/Drains/Airways     Name Placement date Placement time Site Days   Peripheral IV 10/09/22 18 G Anterior;Proximal;Right Forearm 10/09/22  1000  Forearm  less than 1   Wound / Incision (Open or Dehisced) 09/10/22 Skin tear Arm Left;Lower;Posterior;Proximal  09/10/22  1530  Arm  29   Wound / Incision (Open or Dehisced) 09/10/22 Skin tear Arm Lower;Posterior;Proximal;Right small skin tear, left elbow 09/10/22  1817  Arm  29            Intake/Output Last 24 hours No intake or output data in the 24 hours ending 10/09/22 1222  Labs/Imaging Results for orders placed or performed during the hospital encounter of 10/09/22 (from the past 48 hour(s))  Brain natriuretic peptide     Status: Abnormal   Collection Time: 10/09/22  9:38 AM  Result  Value Ref Range   B Natriuretic Peptide 389.2 (H) 0.0 - 100.0 pg/mL    Comment: Performed at [facility], [address] [place], [place] [address]  Comprehensive metabolic panel     Status: Abnormal   Collection Time: 10/09/22  9:39 AM  Result Value Ref Range   Sodium 141 135 - 145 mmol/L   Potassium 4.3 3.5 - 5.1 mmol/L   Chloride 104 98 - 111 mmol/L   CO2 25 22 - 32 mmol/L   Glucose, Bld 319 (H) 70 - 99 mg/dL    Comment: Glucose reference range applies only to samples taken after fasting for at least 8 hours.   BUN 32 (H) 8 - 23 mg/dL   Creatinine, Ser 1.47 (H) 0.44 - 1.00 mg/dL   Calcium 9.0 8.9 - 82.9 mg/dL   Total Protein 5.4 (L) 6.5 - 8.1 g/dL   Albumin 2.9 (L) 3.5 - 5.0 g/dL   AST 34 15 - 41 U/L   ALT 32 0 - 44 U/L   Alkaline Phosphatase 66 38 - 126 U/L   Total Bilirubin 0.5 0.3 - 1.2 mg/dL   GFR, Estimated 36 (L) >60 mL/min    Comment: (NOTE) Calculated using the CKD-EPI Creatinine Equation (2021)    Anion gap 12 5 - 15    Comment: Performed at [facility], [address], [place], [place] [address]  I-Stat venous blood gas, [facility] ED, [facility], [facility])     Status: Abnormal   Collection Time: 10/09/22  9:45 AM  Result Value Ref Range   pH, Ven 7.241 (L) 7.25 - 7.43   pCO2, Ven 63.7 (H) 44 - 60 mmHg   pO2, Ven 43 32 - 45 mmHg   Bicarbonate 27.3 20.0 - 28.0 mmol/L   TCO2 29 22 - 32 mmol/L   O2 Saturation 68 %   Acid-base deficit 1.0 0.0 - 2.0 mmol/L   Sodium 137 135 - 145 mmol/L   Potassium 4.2 3.5 - 5.1 mmol/L   Calcium, Ion 1.16 1.15 - 1.40 mmol/L   HCT 37.0 36.0 - 46.0 %   Hemoglobin 12.6 12.0 - 15.0 g/dL   Sample type VENOUS    DG Chest Portable 1 View  Result Date: 10/09/2022 CLINICAL DATA:  Shortness of breath EXAM: PORTABLE CHEST 1 VIEW COMPARISON:  Chest x-ray dated October 04, 2022 FINDINGS: Cardiac and mediastinal contours are unchanged. Emphysema. Mild bibasilar opacities likely due to scarring or atelectasis. No focal consolidation. No evidence of  pneumothorax. No evidence of pleural effusion. IMPRESSION: Emphysema. No focal consolidation. Electronically Signed   By: Allegra Lai M.D.   On: 10/09/2022 10:04    Pending Labs Unresulted Labs (From admission, onward)     Start     Ordered   10/09/22 1000  CBC with Differential/Platelet  Once,   STAT        10/09/22 0865  Vitals/Pain Today's Vitals   10/09/22 0945 10/09/22 0948 10/09/22 1030 10/09/22 1045  BP: (!) 152/83  127/68 129/64  Pulse: 90 85 88 89  Resp: (!) 26 (!) 23 (!) 21 (!) 22  SpO2: 100% 98% 100% 100%  Weight:      Height:      PainSc:        Isolation Precautions No active isolations  Medications Medications  albuterol (PROVENTIL) (2.5 MG/3ML) 0.083% nebulizer solution (has no administration in time range)  albuterol (PROVENTIL,VENTOLIN) solution continuous neb (15 mg/hr Nebulization Given 10/09/22 1023)  LORazepam (ATIVAN) injection 0.5 mg (0.5 mg Intravenous Given 10/09/22 1010)    Mobility walks with device     Focused Assessments Pulmonary Assessment Handoff:  Lung sounds: Bilateral Breath Sounds: Expiratory wheezes L Breath Sounds: Inspiratory wheezes R Breath Sounds: Inspiratory wheezes O2 Device: Bi-PAP      R Recommendations: See Admitting Provider Note  Report given to:   Additional Notes: Pt is on 3L of O2, Pt usually wear 2L of O2 at home. Axox4. VSS  '

## 2022-10-09 NOTE — ED Triage Notes (Signed)
Pt BIB EMS due to [facility]. Pt used inhalers, belly breathing. Hx of COPD. EMS gave IM epi, solumedrol, mag, and duoneb. Axox4.

## 2022-10-09 NOTE — Consult Note (Addendum)
Consultation Note Date: 10/09/2022   Patient Name: Kim [name]  DOB: [birth year]  MRN: [id number]  Age / Sex: 67 y.o., female  PCP: Shirline Frees, NP Referring Physician: Jonah Blue, MD  Reason for Consultation: Establishing goals of care  HPI/Patient Profile: 67 y.o. female  with past medical history of COPD on 2L [place] O2 and nocturnal BIPAP, SAH, HTN, CAD, and depression presented to ED on 10/09/22 from home with complaints of increasing shortness of breath. She was recently admitted 6/29-10/07/22 for COPD exacerbation - this is patient's third admission in 6 months for the same and she is a 7 day readmission. Patient was admitted on 10/09/2022 with COPD exacerbation and AKI on CKD 3a.   Clinical Assessment and Goals of Care: I have reviewed medical records including EPIC notes, labs, and imaging. Received report from primary RN - no acute concerns.   Went to visit patient at bedside - husband/Rodney present. Patient was lying in bed awake, alert, oriented, and able to participate in conversation. No signs or non-verbal gestures of pain noted. No respiratory distress, increased work of breathing, or secretions noted; though she does have mild conversational dyspnea. Patient indicates her shortness of breath is currently "medium low." She reports her breathing has improved since initial presentation to ED. She is now on 2L O2 [place], off Bipap.  Met with patient and her husband  to discuss diagnosis, prognosis, GOC, EOL wishes, disposition, and options.  I introduced Palliative Medicine as specialized medical care for people living with serious illness. It focuses on providing relief from the symptoms and stress of a serious illness. The goal is to improve quality of life for both the patient and the family.  We discussed a brief life review of the patient as well as functional and nutritional status. Patient and her  husband have been married 21 years - they have two sons. Prior to hospitalization, patient was living in an apartment with her husband. Patient was able to ambulate short distances within her home and to her car, while taking breaks as needed. Outside the home, Thereasa Distance pushed patient in her Rolator. Patient wears 2-3L O2 [place] at home chronically. Therapeutic listening provided as patient describes what a COPD flare feels like to her, which includes panic attacks. Validated these can be normal but very difficult experiences.  We discussed patient's current illness and what it means in the larger context of patient's on-going co-morbidities. Education provided that COPD is a progressive, non-curable disease underlying the patient's current acute medical conditions. Natural disease trajectory and expectations at EOL were discussed. I attempted to elicit values and goals of care important to the patient. The difference between aggressive medical intervention and comfort care was considered in light of the patient's goals of care. Patient tells me "I don't want to talk too much about it." She is very hesitant to allow PMT to continue working with her. Attempted to discuss the differences between Palliative Care vs Hospice, but she tells me "I would rather not." She has cared for others who have worked with both and tells me she is familiar with the differences. Attempted to explore her thoughts and feelings regarding hesitency working with PMT. She tells me "I am not adding any more medications." Therapeutic listening provided as she describes her recent experiences with [place] and Dupixent. Verified that PMT can assist with symptom management but we do not have to make any adjustments at this time, or ever, if she does not want to. Validated it would  be best for pulmonology to complete their consult for recommendations prior to any medication adjustments. She feels better with this plan. She ultimately tells me "I am  scared." She understands the seriousness of her chronic illnesses and that she may be approaching EOL. We discussed her COPD exacerbation could be from medication side effects vs progression of disease. She and Thereasa Distance expressed understanding.   Patient tells me her two biggest goals are: 1. To feel better 2. To go fishing. She would like to continue to treat the treatable while in house. She states "I hope I am here for a couple of days." She feels like she might have discharged too early previously and is willing to stay until medically optimized. She expressed telling physicians last time she really wanted to go home.   She is open to ongoing GOC pending her clinical course and insights from pulmonology.   Visit also consisted of discussions dealing with the complex and emotionally intense issues of symptom management and palliative care in the setting of serious and potentially life-threatening illness.  Discussed with patient/family the importance of continued conversation with each other and the medical providers regarding overall plan of care and treatment options, ensuring decisions are within the context of the patient's values and GOCs.    Questions and concerns were addressed. The patient/family was encouraged to call with questions and/or concerns. PMT card was provided.   Primary Decision Maker: PATIENT    SUMMARY OF RECOMMENDATIONS   Continue to treat the treatable Continue DNR/DNI as previously documented - durable DNR form completed and placed in shadow chart. Copy was made and will be scanned into [facility]yn[facility]/ACP tab Patient is hopeful to feel better and return home - she is hesitant to discuss outpatient Palliative Care or hospice  Ongoing GOC pending clinical course and insights from Pulmonology PMT will continue to follow and support holistically   Code Status/Advance Care Planning: DNR  Palliative Prophylaxis:  Aspiration, Oral Care, and Turn Reposition  Additional  Recommendations (Limitations, Scope, Preferences): Full Scope Treatment and No Tracheostomy  Psycho-social/Spiritual:  Desire for further Chaplaincy support:no Created space and opportunity for patient and family to express thoughts and feelings regarding patient's current medical situation.  Emotional support and therapeutic listening provided.  Prognosis:  Unable to determine  Discharge Planning: To Be Determined      Primary Diagnoses: Present on Admission:  COPD with acute exacerbation (HCC)  Stage 3a chronic kidney disease (CKD) (HCC)  Essential hypertension  Dyslipidemia  DNR (do not resuscitate)  Chronic systolic CHF (congestive heart failure) (HCC)  AKI (acute kidney injury) (HCC)   I have reviewed the medical record, interviewed the patient and family, and examined the patient. The following aspects are pertinent.  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   OSTEOARTHRITIS 06/05/2009   PARESTHESIA 08/24/2007   TOBACCO ABUSE 01/25/2010   Social History   Socioeconomic History   Marital status: Married    Spouse name: Thereasa Distance   Number of children: 2   Years of education: Not on file   Highest education level: Not on file  Occupational History   Occupation: Patient care aide-sits with elderly lady  Tobacco Use   Smoking status: Former    Packs/day: 2.00    Years: 38.00    Additional pack years: 0.00    Total pack years: 76.00  Types: Cigarettes    Quit date: 03/07/2014    Years since quitting: 8.5   Smokeless tobacco: Never  Vaping Use   Vaping Use: Never used  Substance and Sexual Activity   Alcohol use: No    Alcohol/week: 0.0 standard drinks of alcohol   Drug use: No   Sexual activity: Not on file  Other Topics Concern   Not on file  Social History Narrative   [facility] (01/12/17):    Originally from [facility]. Has lived in [place] for 17 years. Married. Has 2 dogs currently. No mold and only 1 indoor plant. Had her home professionally cleaned a month ago. No bird or hot tub exposure. 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    Right handed    Social Determinants of Health   Financial Resource Strain: High Risk (09/10/2021)   Overall Financial Resource Strain (CARDIA)    Difficulty of Paying Living Expenses: Very hard  Food Insecurity: No Food Insecurity (10/08/2022)   Hunger Vital Sign    Worried About Running Out of Food in the Last Year: Never true    Ran Out of Food in the Last Year: Never true  Transportation Needs: No Transportation Needs (10/08/2022)   PRAPARE - Administrator, Civil Service (Medical): No    Lack of Transportation (Non-Medical): No  Physical Activity: Inactive (03/25/2021)   Exercise Vital Sign    Days of Exercise per Week: 0 days    Minutes of Exercise per Session: 0 min  Stress: No Stress Concern Present (09/10/2021)   [employer] of Occupational Health - Occupational Stress Questionnaire    Feeling of Stress : Not at all  Social Connections: Moderately Isolated (03/25/2021)   Social Connection and Isolation Panel [NHANES]    Frequency of Communication with Friends and Family: More than three times a week    Frequency of Social Gatherings with Friends and Family: More than three times a week    Attends Religious Services: Never    Database administrator or Organizations: No    Attends Engineer, structural: Never    Marital Status: Married   Family History  Problem Relation Age of Onset   Cancer Mother        lung   Heart attack Mother    Hypertension Sister    Multiple sclerosis Brother    Diabetes Sister    Rheum arthritis Sister    Thyroid disease Sister    Multiple sclerosis Other    Alcohol abuse Other    Arthritis Other    Diabetes Other    Kidney disease Other    Cancer Other         lung,ovarian,skin, uterine   Stroke Other    Heart disease Other    Melanoma Other    Osteoporosis Other    Heart attack Maternal Uncle    Heart attack Other        NEICE   Heart attack Other        NEPHEW   Hypertension Brother    Stroke Maternal Grandmother    Colon cancer Neg Hx    Scheduled Meds:  albuterol       ALPRAZolam  0.5 mg Oral Q6H   aspirin EC  81 mg Oral Daily   docusate sodium  100 mg Oral BID   enoxaparin (LOVENOX) injection  30 mg Subcutaneous Q24H   ezetimibe  10 mg Oral Daily   Guaifenesin  1,200  mg Oral Q12H   ipratropium-albuterol  3 mL Nebulization Q6H   methylPREDNISolone (SOLU-MEDROL) injection  60 mg Intravenous Q12H   Followed by   Melene Muller ON 10/10/2022] predniSONE  40 mg Oral Q breakfast   mometasone-formoterol  2 puff Inhalation BID   PARoxetine  40 mg Oral Daily   rosuvastatin  20 mg Oral Daily   sodium chloride flush  3 mL Intravenous Q12H   spironolactone  12.5 mg Oral Daily   Continuous Infusions:  ceFEPime (MAXIPIME) IV     lactated ringers     PRN Meds:.acetaminophen **OR** acetaminophen, albuterol, albuterol, bisacodyl, hydrALAZINE, HYDROcodone-acetaminophen, magnesium oxide, ondansetron **OR** ondansetron (ZOFRAN) IV, polyethylene glycol, polyvinyl alcohol, zolpidem Medications Prior to Admission:  Prior to Admission medications   Medication Sig Start Date End Date Taking? Authorizing Provider  albuterol (VENTOLIN HFA) 108 (90 Base) MCG/ACT inhaler INHALE 2 PUFFS INTO THE LUNGS EVERY 4 TO 6 HOURS AS NEEDED FOR SHORTNESS OF BREATH OR WHEEZING Patient taking differently: Inhale 2 puffs into the lungs See admin instructions. Inhale 2 puffs every 4-6 hours as needed for wheezing or shortness or breath. 07/23/22   Cobb, Ruby Cola, NP  ALPRAZolam Prudy Feeler) 0.5 MG tablet Take 1 tablet (0.5 mg total) by mouth every 6 (six) hours. 09/24/22   Nafziger, Kandee Keen, NP  aspirin EC 81 MG EC tablet Take 1 tablet (81 mg total) by mouth daily. 07/08/15   Rhetta Mura, MD  bisoprolol (ZEBETA) 5 MG tablet Take 0.5 tablets (2.5 mg total) by mouth daily as needed (Take HALF tablet by mouth for break through chest pain as needed). Patient taking differently: Take 2.5 mg by mouth daily. 10/03/22   Kathleene Hazel, MD  budesonide-formoterol (SYMBICORT) 160-4.5 MCG/ACT inhaler Inhale 2 puffs into the lungs 2 (two) times daily. 07/15/22   Oretha Milch, MD  butalbital-acetaminophen-caffeine (FIORICET) [phone] MG tablet Take 1 tablet by mouth every 4 (four) hours as needed for headache. TAKE 1 TABLET BY MOUTH EVERY 4 HOURS AS NEEDED FOR HEADACHETAKE 1 TABLET BY MOUTH EVERY 4 HOURS AS NEEDED FOR HEADACHE 09/24/22   Nafziger, Kandee Keen, NP  Carboxymethylcellulose Sodium (ARTIFICIAL TEARS OP) Place 1 drop into both eyes 2 (two) times daily as needed (dryness, irritation).    [provider]  cetirizine (ZYRTEC) 10 MG tablet Take 10 mg by mouth daily.    [provider]  Cholecalciferol (VITAMIN D3 PO) Take 5,000 Units by mouth daily.    [provider]  Cyanocobalamin (VITAMIN B-12 PO) Take 1 tablet by mouth daily.    [provider]  ezetimibe (ZETIA) 10 MG tablet TAKE 1 TABLET(10 MG) BY MOUTH DAILY Patient taking differently: Take 10 mg by mouth daily. 01/14/22   Nafziger, Kandee Keen, NP  furosemide (LASIX) 40 MG tablet Take 1 tablet (40 mg total) by mouth daily. Patient taking differently: Take 40 mg by mouth daily as needed for fluid. 04/14/22   Kathleene Hazel, MD  HYDROcodone-acetaminophen (NORCO) 7.5-325 MG tablet Take 1 tablet by mouth every 6 (six) hours as needed for moderate pain. 08/05/22   Nafziger, Kandee Keen, NP  ipratropium-albuterol (DUONEB) 0.5-2.5 (3) MG/3ML SOLN USE 3 ML VIA NEBULIZER EVERY 6 HOURS AS NEEDED Patient taking differently: Take 3 mLs by nebulization every 6 (six) hours as needed (wheezing, shortness of breath). 05/16/22   Oretha Milch, MD  magnesium oxide (MAG-OX) 400 MG tablet Take 400 mg by mouth  daily as needed (constipation).    [provider]  MUCINEX MAXIMUM STRENGTH 1200 MG  TB12 Take 1,200 mg by mouth every 12 (twelve) hours.    [provider]  nitroGLYCERIN (NITROSTAT) 0.4 MG SL tablet Place 1 tablet (0.4 mg total) under the tongue every 5 (five) minutes as needed for chest pain. 10/24/20   Kathleene Hazel, MD  nystatin (MYCOSTATIN) 100000 UNIT/ML suspension Take 5 mLs (500,000 Units total) by mouth 4 (four) times daily. 09/24/22   Nafziger, Kandee Keen, NP  ondansetron (ZOFRAN) 4 MG tablet Take 1 tablet (4 mg total) by mouth every 8 (eight) hours as needed for nausea or vomiting. 09/05/21   Nafziger, Kandee Keen, NP  OXYGEN Inhale 2-3 L/min into the lungs continuous.    [provider]  PARoxetine (PAXIL) 40 MG tablet TAKE 1 TABLET(40 MG) BY MOUTH EVERY MORNING Patient taking differently: Take 40 mg by mouth daily. 09/30/22   Nafziger, Kandee Keen, NP  polyethylene glycol powder (GLYCOLAX/MIRALAX) 17 GM/SCOOP powder Take 17 g by mouth daily as needed for moderate constipation (constipation).    [provider]  potassium chloride (KLOR-CON) 10 MEQ tablet Take 1 tablet (10 mEq total) by mouth as needed (take with furosemide (lasix)). Patient taking differently: Take 10 mEq by mouth daily as needed (with furosemide.). 06/05/22   Kathleene Hazel, MD  predniSONE (DELTASONE) 10 MG tablet Take 4 tablets daily for 2days, then take 3 tablets daily for 2 days, then take 2 tablets daily for 2 days, then go back to your usual regimen (5 mg on Monday Wednesday Friday, 10 mg Tuesdays, Thursdays, Saturdays and Sundays) 10/07/22   Ghimire, Werner Lean, MD  rosuvastatin (CRESTOR) 20 MG tablet Take 1 tablet (20 mg total) by mouth daily. 07/14/22   Kathleene Hazel, MD  Spacer/Aero Chamber Mouthpiece MISC 1 Device by Does not apply route as directed. 01/12/17   Roslynn Amble, MD  spironolactone (ALDACTONE) 25 MG tablet Take 0.5 tablets (12.5 mg total) by mouth daily.  09/19/22 10/19/22  Arrien, York Ram, MD   Allergies  Allergen Reactions   Tape Other (See Comments)    Skin tears easily, peels skin off   Daliresp [Roflumilast] Other (See Comments)    Skin peeling   Cozaar [Losartan] Other (See Comments)    Possible cause of severe hypotension   Dupixent [Dupilumab] Diarrhea, Swelling and Cough    Facial swelling Headache   Latex Other (See Comments)    Peels skin off   Morphine And Codeine Other (See Comments)    Hallucinations    Penicillins Hives   Statins Other (See Comments)    Myalgia    Zestril [Lisinopril] Cough   Omnicef [Cefdinir] Rash   Review of Systems  Constitutional:  Positive for activity change and fatigue.  Respiratory:  Positive for shortness of breath.   Gastrointestinal:  Negative for nausea and vomiting.  Neurological:  Positive for weakness.  All other systems reviewed and are negative.   Physical Exam Vitals and nursing note reviewed.  Constitutional:      General: She is not in acute distress.    Appearance: She is ill-appearing.  Pulmonary:     Effort: No respiratory distress.     Comments: Mild conversational dyspnea Skin:    General: Skin is warm and dry.  Neurological:     Mental Status: She is alert and oriented to person, place, and time.     Motor: Weakness present.  Psychiatric:        Attention and Perception: Attention normal.        Behavior: Behavior is cooperative.  Cognition and Memory: Cognition and memory normal.     Vital Signs: BP 115/64   Pulse 90   Temp 97.9 F (36.6 C) (Oral)   Resp 19   Ht 5\' 1"  (1.549 m)   Wt 63.5 kg   SpO2 99%   BMI 26.45 kg/m  Pain Scale: 0-10   Pain Score: 4    SpO2: SpO2: 99 % O2 Device:SpO2: 99 % O2 Flow Rate: .   IO: Intake/output summary: No intake or output data in the 24 hours ending 10/09/22 1344  LBM:   Baseline Weight: Weight: 63.5 kg Most recent weight: Weight: 63.5 kg     Palliative Assessment/Data: PPS  50%     Time In: 1345 Time Out: 1515 Time Total: 90 minutes  Greater than 50%  of this time was spent counseling and coordinating care related to the above assessment and plan.  Signed by: Haskel Khan, NP   Please contact Palliative Medicine Team phone at [phone] for questions and concerns.  For individual provider: See Amion  *Portions of this note are a verbal dictation therefore any spelling and/or grammatical errors are due to the "[employer]agon [employer] One" system interpretation.

## 2022-10-09 NOTE — Progress Notes (Signed)
   10/09/22 2310  BiPAP/CPAP/SIPAP  BiPAP/CPAP/SIPAP Pt Type Adult  BiPAP/CPAP/SIPAP Resmed  Mask Type Full face mask (pt's home mask)  Mask Size Medium  Respiratory Rate 18 breaths/min  IPAP 16 cmH20  EPAP 8 cmH2O  Flow Rate 2 lpm  Patient Home Equipment Yes (mask and tubing)  Auto Titrate No  BiPAP/CPAP /SiPAP Vitals  Pulse Rate 84  Resp 18  SpO2 95 %

## 2022-10-09 NOTE — H&P (Signed)
History and Physical    Patient: Kim [name]:[id number] DOB: 08/12/1955 DOA: 10/09/2022 DOS: the patient was seen and examined on 10/09/2022 PCP: Shirline Frees, NP  Patient coming from: Home - lives with husband; NOK: [place], [place], [phone]   Chief Complaint: SOB  HPI: Kim [name] is a 67 y.o. female with medical history significant of COPD on 2L [place] O2 and nocturnal BIPAP, SAH, HTN, CAD, and depression presenting with SOB.  She was previously admitted with COPD exacerbation from 6/5-13 and discharged on a slow prednisone taper and readmitted from 6/29-7/2 with recurrent COPD exacerbation, having completed 3 days of Azithromycin + steroids.  She was doing fairly well when she was discharged but maybe was discharged too early per her husband.  She started feeling like she wasn't moving good air in her right lung yesterday and awoke this AM again profoundly SOB.  She thinks that the first admission was related to starting Farxiga, the second related to lack of AC in their apartment.  This time, she thinks it might be related to her BP medication, "the little red pill" - which might be bisoprolol.  She was off BIPAP by the time of my evaluation.    ER Course:  Epi, Mag++, solumedrol, continuous neb.  Placed on BIPAP ->  o2 but still very tight.       Review of Systems: As mentioned in the history of present illness. All other systems reviewed and are negative. 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   OSTEOARTHRITIS 06/05/2009   PARESTHESIA 08/24/2007   TOBACCO ABUSE 01/25/2010   Past Surgical History:  Procedure Laterality Date   ABDOMINAL HYSTERECTOMY     APPENDECTOMY  1969   CARDIAC CATHETERIZATION N/A 07/05/2015   Procedure: Left Heart Cath and Coronary Angiography;  Surgeon: Marykay Lex, MD;  Location: [facility]  INVASIVE CV LAB;  Service: Cardiovascular;  Laterality: N/A;   CHOLECYSTECTOMY  1989   collapse lung  1990   COLONOSCOPY WITH PROPOFOL N/A 05/14/2018   Procedure: COLONOSCOPY WITH PROPOFOL;  Surgeon: Hilarie Fredrickson, MD;  Location: [facility];  Service: Endoscopy;  Laterality: N/A;   EXCISION MASS ABDOMINAL  2019   EXPLORATORY LAPAROTOMY  1969   LEFT HEART CATH AND CORONARY ANGIOGRAPHY N/A 11/19/2020   Procedure: LEFT HEART CATH AND CORONARY ANGIOGRAPHY;  Surgeon: Yvonne Kendall, MD;  Location: [facility] INVASIVE CV LAB;  Service: Cardiovascular;  Laterality: N/A;   POLYPECTOMY  05/14/2018   Procedure: POLYPECTOMY;  Surgeon: Hilarie Fredrickson, MD;  Location: [facility];  Service: Endoscopy;;   SALPINGOOPHORECTOMY  2019   Social History:  reports that she quit smoking about 8 years ago. Her smoking use included cigarettes. She has a 76.00 pack-year smoking history. She has never used smokeless tobacco. She reports that she does not drink alcohol and does not use drugs.  Allergies  Allergen Reactions   Tape Other (See Comments)    Skin tears easily, peels skin off   Daliresp [Roflumilast] Other (See Comments)    Skin peeling   Cozaar [Losartan] Other (See Comments)    Possible cause of severe hypotension   Dupixent [Dupilumab] Diarrhea, Swelling and Cough    Facial swelling Headache   Latex Other (See Comments)    Peels skin off   Morphine And Codeine Other (See Comments)    Hallucinations  Penicillins Hives   Statins Other (See Comments)    Myalgia    Zestril [Lisinopril] Cough   Omnicef [Cefdinir] Rash    Family History  Problem Relation Age of Onset   Cancer Mother        lung   Heart attack Mother    Hypertension Sister    Multiple sclerosis Brother    Diabetes Sister    Rheum arthritis Sister    Thyroid disease Sister    Multiple sclerosis Other    Alcohol abuse Other    Arthritis Other    Diabetes Other    Kidney disease Other    Cancer Other        lung,ovarian,skin,  uterine   Stroke Other    Heart disease Other    Melanoma Other    Osteoporosis Other    Heart attack Maternal Uncle    Heart attack Other        NEICE   Heart attack Other        NEPHEW   Hypertension Brother    Stroke Maternal Grandmother    Colon cancer Neg Hx     Prior to Admission medications   Medication Sig Start Date End Date Taking? Authorizing Provider  albuterol (VENTOLIN HFA) 108 (90 Base) MCG/ACT inhaler INHALE 2 PUFFS INTO THE LUNGS EVERY 4 TO 6 HOURS AS NEEDED FOR SHORTNESS OF BREATH OR WHEEZING Patient taking differently: Inhale 2 puffs into the lungs See admin instructions. Inhale 2 puffs every 4-6 hours as needed for wheezing or shortness or breath. 07/23/22   Cobb, Ruby Cola, NP  ALPRAZolam Prudy Feeler) 0.5 MG tablet Take 1 tablet (0.5 mg total) by mouth every 6 (six) hours. 09/24/22   Nafziger, Kandee Keen, NP  aspirin EC 81 MG EC tablet Take 1 tablet (81 mg total) by mouth daily. 07/08/15   Rhetta Mura, MD  bisoprolol (ZEBETA) 5 MG tablet Take 0.5 tablets (2.5 mg total) by mouth daily as needed (Take HALF tablet by mouth for break through chest pain as needed). Patient taking differently: Take 2.5 mg by mouth daily. 10/03/22   Kathleene Hazel, MD  budesonide-formoterol (SYMBICORT) 160-4.5 MCG/ACT inhaler Inhale 2 puffs into the lungs 2 (two) times daily. 07/15/22   Oretha Milch, MD  butalbital-acetaminophen-caffeine (FIORICET) [id number] MG tablet Take 1 tablet by mouth every 4 (four) hours as needed for headache. TAKE 1 TABLET BY MOUTH EVERY 4 HOURS AS NEEDED FOR HEADACHETAKE 1 TABLET BY MOUTH EVERY 4 HOURS AS NEEDED FOR HEADACHE 09/24/22   Nafziger, Kandee Keen, NP  Carboxymethylcellulose Sodium (ARTIFICIAL TEARS OP) Place 1 drop into both eyes 2 (two) times daily as needed (dryness, irritation).    [provider]  cetirizine (ZYRTEC) 10 MG tablet Take 10 mg by mouth daily.    [provider]  Cholecalciferol (VITAMIN D3 PO) Take 5,000 Units by mouth  daily.    [provider]  Cyanocobalamin (VITAMIN B-12 PO) Take 1 tablet by mouth daily.    [provider]  dapagliflozin propanediol (FARXIGA) 10 MG TABS tablet Take 1 tablet (10 mg total) by mouth daily. 09/19/22   Arrien, York Ram, MD  ezetimibe (ZETIA) 10 MG tablet TAKE 1 TABLET(10 MG) BY MOUTH DAILY Patient taking differently: Take 10 mg by mouth daily. 01/14/22   Nafziger, Kandee Keen, NP  furosemide (LASIX) 40 MG tablet Take 1 tablet (40 mg total) by mouth daily. Patient taking differently: Take 40 mg by mouth daily as needed for fluid. 04/14/22   Kathleene Hazel,  MD  HYDROcodone-acetaminophen (NORCO) 7.5-325 MG tablet Take 1 tablet by mouth every 6 (six) hours as needed for moderate pain. 08/05/22   Nafziger, Kandee Keen, NP  ipratropium-albuterol (DUONEB) 0.5-2.5 (3) MG/3ML SOLN USE 3 ML VIA NEBULIZER EVERY 6 HOURS AS NEEDED Patient taking differently: Take 3 mLs by nebulization every 6 (six) hours as needed (wheezing, shortness of breath). 05/16/22   Oretha Milch, MD  magnesium oxide (MAG-OX) 400 MG tablet Take 400 mg by mouth daily as needed (constipation).    [provider]  MUCINEX MAXIMUM STRENGTH 1200 MG TB12 Take 1,200 mg by mouth every 12 (twelve) hours.    [provider]  nitroGLYCERIN (NITROSTAT) 0.4 MG SL tablet Place 1 tablet (0.4 mg total) under the tongue every 5 (five) minutes as needed for chest pain. 10/24/20   Kathleene Hazel, MD  nystatin (MYCOSTATIN) 100000 UNIT/ML suspension Take 5 mLs (500,000 Units total) by mouth 4 (four) times daily. 09/24/22   Nafziger, Kandee Keen, NP  ondansetron (ZOFRAN) 4 MG tablet Take 1 tablet (4 mg total) by mouth every 8 (eight) hours as needed for nausea or vomiting. 09/05/21   Nafziger, Kandee Keen, NP  OXYGEN Inhale 2-3 L/min into the lungs continuous.    [provider]  PARoxetine (PAXIL) 40 MG tablet TAKE 1 TABLET(40 MG) BY MOUTH EVERY MORNING Patient taking differently: Take 40 mg by mouth daily.  09/30/22   Nafziger, Kandee Keen, NP  polyethylene glycol powder (GLYCOLAX/MIRALAX) 17 GM/SCOOP powder Take 17 g by mouth daily as needed for moderate constipation (constipation).    [provider]  potassium chloride (KLOR-CON) 10 MEQ tablet Take 1 tablet (10 mEq total) by mouth as needed (take with furosemide (lasix)). Patient taking differently: Take 10 mEq by mouth daily as needed (with furosemide.). 06/05/22   Kathleene Hazel, MD  predniSONE (DELTASONE) 10 MG tablet Take 4 tablets daily for 2days, then take 3 tablets daily for 2 days, then take 2 tablets daily for 2 days, then go back to your usual regimen (5 mg on Monday Wednesday Friday, 10 mg Tuesdays, Thursdays, Saturdays and Sundays) 10/07/22   Ghimire, Werner Lean, MD  rosuvastatin (CRESTOR) 20 MG tablet Take 1 tablet (20 mg total) by mouth daily. 07/14/22   Kathleene Hazel, MD  Spacer/Aero Chamber Mouthpiece MISC 1 Device by Does not apply route as directed. 01/12/17   Roslynn Amble, MD  spironolactone (ALDACTONE) 25 MG tablet Take 0.5 tablets (12.5 mg total) by mouth daily. 09/19/22 10/19/22  Coralie Keens, MD    Physical Exam: Vitals:   10/09/22 1030 10/09/22 1045 10/09/22 1230 10/09/22 1242  BP: 127/68 129/64 115/64   Pulse: 88 89 90   Resp: (!) 21 (!) 22 19   Temp:    97.9 F (36.6 C)  TempSrc:    Oral  SpO2: 100% 100% 99%   Weight:      Height:       General:  Appears acutely on chronically ill, older than stated age, entire body appears to be working with each respiration Eyes:  EOMI, normal lids, iris ENT:  grossly normal hearing, lips & tongue, mmm Neck:  no LAD, masses or thyromegaly Cardiovascular:  RRR, no m/r/g. No LE edema.  Respiratory:   Moderately poor air movement diffusely  Moderately increased respiratory effort. Abdomen:  soft, NT, ND Skin:  no rash or induration seen on limited exam Musculoskeletal:  grossly normal tone BUE/BLE, good ROM, no bony abnormality Psychiatric:  blunted  mood and affect, speech  fluent and appropriate, AOx3 Neurologic:  CN 2-12 grossly intact, moves all extremities in coordinated fashion   Radiological Exams on Admission: Independently reviewed - see discussion in A/P where applicable  DG Chest Portable 1 View  Result Date: 10/09/2022 CLINICAL DATA:  Shortness of breath EXAM: PORTABLE CHEST 1 VIEW COMPARISON:  Chest x-ray dated October 04, 2022 FINDINGS: Cardiac and mediastinal contours are unchanged. Emphysema. Mild bibasilar opacities likely due to scarring or atelectasis. No focal consolidation. No evidence of pneumothorax. No evidence of pleural effusion. IMPRESSION: Emphysema. No focal consolidation. Electronically Signed   By: Allegra Lai M.D.   On: 10/09/2022 10:04    EKG: Independently reviewed.  Junctional rhythm with rate 87; nonspecific ST changes with no evidence of acute ischemia   Labs on Admission: I have personally reviewed the available labs and imaging studies at the time of the admission.  Pertinent labs:    VBG: 7.241/63.7/27.3 Glucose 319 BUN 32/Creatinine 1.56/GFR 36; 28/1.26/47 on 7/1 Albumin 2.9 BNP 389.2 WBC 19.6 Hgb 11.9   Assessment and Plan: Principal Problem:   COPD with acute exacerbation (HCC) Active Problems:   Chronic systolic CHF (congestive heart failure) (HCC)   Essential hypertension   Stage 3a chronic kidney disease (CKD) (HCC)   Dyslipidemia   DNR (do not resuscitate)   AKI (acute kidney injury) (HCC)    COPD exacerbation (HCC) Patient's shortness of breath and productive cough are most likely caused by acute COPD exacerbation - possibly related to bisoprolol but now with 3 back-to-back admissions for the same She has history of O2-dependent COPD, currently on her usual 2L [place] O2 but on BIPAP on presentation She does not have fever but has moderate leukocytosis Chest x-ray is not consistent with pneumonia Will admit patient to progressive care - with her failure of outpatient therapy  and recurrent need for hospitalization, it seems likely that she will need several days of hospitalization to show sufficient improvement for discharge. Nebulizers: scheduled Duoneb and prn albuterol Solu-Medrol 60 mg IV BID -> Prednisone 40 mg PO daily IV Cefepime Continue Symbicort (Dulera per formulary) Coordinated care with [facility] team/PT/OT/Nutrition/RT consults Continue bipap at night and as needed Keep 02 saturation 88%-92% Pulmonology consulted due to recurrent admissions Palliative care also consulted  AKI on Stage 3a chronic kidney disease (CKD) (HCC) AKI compared to at time of last hospitalization, likely related to poor PO intake in the setting of recurrent illness Gentle IVF hydration Attempt to avoid nephrotoxic medications including diuretics for now Recheck BMP in AM    Chronic systolic CHF (congestive heart failure) (HCC) Echo earlier this month with EF 35-40% and grade 1 diastolic dysfunction Appears compensated Hold Lasix for now Patient reported side effects from SGLT 2 inh, had a yeast infection Continue ASA   Essential hypertension Continue spironolactone Hold bisoprolol due to concern that this is contributing to recurrent respiratory issues  Check orthostatics in AM   Anxiety and depression Continue with as needed alprazolam and scheduled paroxetine   Dyslipidemia Continue with ezetimibe and rosuvastatin  DNR  -I have discussed code status with the patient and her husband and  they are in agreement that the patient would not desire resuscitation and would prefer to die a natural death should that situation arise. -She will need a gold out of facility DNR form at the time of discharge      Advance Care Planning:   Code Status: DNR   Consults: Pulmonology; palliative care; PT/OT; nutrition; TOC team  DVT Prophylaxis: Lovenox  Family Communication: Husband was present throughout evaluation  Severity of Illness: The appropriate patient status for  this patient is INPATIENT. Inpatient status is judged to be reasonable and necessary in order to provide the required intensity of service to ensure the patient's safety. The patient's presenting symptoms, physical exam findings, and initial radiographic and laboratory data in the context of their chronic comorbidities is felt to place them at high risk for further clinical deterioration. Furthermore, it is not anticipated that the patient will be medically stable for discharge from the hospital within 2 midnights of admission.   * I certify that at the point of admission it is my clinical judgment that the patient will require inpatient hospital care spanning beyond 2 midnights from the point of admission due to high intensity of service, high risk for further deterioration and high frequency of surveillance required.*  Author: Jonah Blue, MD 10/09/2022 1:36 PM  For on call review [email or web address].

## 2022-10-10 DIAGNOSIS — J441 Chronic obstructive pulmonary disease with (acute) exacerbation: Secondary | ICD-10-CM | POA: Diagnosis not present

## 2022-10-10 DIAGNOSIS — I10 Essential (primary) hypertension: Secondary | ICD-10-CM | POA: Diagnosis not present

## 2022-10-10 DIAGNOSIS — I5022 Chronic systolic (congestive) heart failure: Secondary | ICD-10-CM

## 2022-10-10 DIAGNOSIS — Z515 Encounter for palliative care: Secondary | ICD-10-CM | POA: Diagnosis not present

## 2022-10-10 DIAGNOSIS — N1831 Chronic kidney disease, stage 3a: Secondary | ICD-10-CM | POA: Diagnosis not present

## 2022-10-10 DIAGNOSIS — R0603 Acute respiratory distress: Secondary | ICD-10-CM | POA: Diagnosis not present

## 2022-10-10 DIAGNOSIS — R0600 Dyspnea, unspecified: Secondary | ICD-10-CM

## 2022-10-10 DIAGNOSIS — R0689 Other abnormalities of breathing: Secondary | ICD-10-CM | POA: Diagnosis not present

## 2022-10-10 DIAGNOSIS — Z711 Person with feared health complaint in whom no diagnosis is made: Secondary | ICD-10-CM

## 2022-10-10 LAB — BASIC METABOLIC PANEL
Anion gap: 8 (ref 5–15)
BUN: 34 mg/dL — ABNORMAL HIGH (ref 8–23)
CO2: 26 mmol/L (ref 22–32)
Calcium: 8.8 mg/dL — ABNORMAL LOW (ref 8.9–10.3)
Chloride: 103 mmol/L (ref 98–111)
Creatinine, Ser: 1.22 mg/dL — ABNORMAL HIGH (ref 0.44–1.00)
GFR, Estimated: 48 mL/min — ABNORMAL LOW (ref >60)
Glucose, Bld: 176 mg/dL — ABNORMAL HIGH (ref 70–99)
Potassium: 4.5 mmol/L (ref 3.5–5.1)
Sodium: 137 mmol/L (ref 135–145)

## 2022-10-10 LAB — CBC
HCT: 35.1 % — ABNORMAL LOW (ref 36.0–46.0)
Hemoglobin: 11.1 g/dL — ABNORMAL LOW (ref 12.0–15.0)
MCH: 32 pg (ref 26.0–34.0)
MCHC: 31.6 g/dL (ref 30.0–36.0)
MCV: 101.2 fL — ABNORMAL HIGH (ref 80.0–100.0)
Platelets: 222 10*3/uL (ref 150–400)
RBC: 3.47 MIL/uL — ABNORMAL LOW (ref 3.87–5.11)
RDW: 13.7 % (ref 11.5–15.5)
WBC: 19.1 10*3/uL — ABNORMAL HIGH (ref 4.0–10.5)
nRBC: 0.1 % (ref 0.0–0.2)

## 2022-10-10 MED ORDER — HYDROMORPHONE HCL 2 MG PO TABS
2.0000 mg | ORAL_TABLET | ORAL | Status: DC | PRN
  Administered 2022-10-10 – 2022-10-14 (×6): 2 mg via ORAL
  Filled 2022-10-10 (×6): qty 1

## 2022-10-10 MED ORDER — ADULT MULTIVITAMIN W/MINERALS CH
1.0000 | ORAL_TABLET | Freq: Every day | ORAL | Status: DC
  Administered 2022-10-10 – 2022-10-15 (×6): 1 via ORAL
  Filled 2022-10-10 (×6): qty 1

## 2022-10-10 MED ORDER — REVEFENACIN 175 MCG/3ML IN SOLN
175.0000 ug | Freq: Every day | RESPIRATORY_TRACT | Status: DC
  Administered 2022-10-10 – 2022-10-15 (×6): 175 ug via RESPIRATORY_TRACT
  Filled 2022-10-10 (×6): qty 3

## 2022-10-10 MED ORDER — FUROSEMIDE 40 MG PO TABS
40.0000 mg | ORAL_TABLET | Freq: Once | ORAL | Status: AC
  Administered 2022-10-10: 40 mg via ORAL
  Filled 2022-10-10: qty 1

## 2022-10-10 MED ORDER — BOOST PLUS PO LIQD
237.0000 mL | Freq: Two times a day (BID) | ORAL | Status: DC
  Administered 2022-10-11 – 2022-10-15 (×9): 237 mL via ORAL
  Filled 2022-10-10 (×11): qty 237

## 2022-10-10 MED ORDER — SODIUM CHLORIDE 0.9 % IV SOLN
2.0000 g | Freq: Two times a day (BID) | INTRAVENOUS | Status: DC
  Administered 2022-10-10 – 2022-10-11 (×2): 2 g via INTRAVENOUS
  Filled 2022-10-10 (×2): qty 12.5

## 2022-10-10 MED ORDER — FUROSEMIDE 10 MG/ML IJ SOLN: 40.0000 mg | Freq: Once | INTRAMUSCULAR | Status: DC

## 2022-10-10 MED ORDER — BISOPROLOL FUMARATE 5 MG PO TABS
5.0000 mg | ORAL_TABLET | Freq: Every day | ORAL | Status: DC
  Administered 2022-10-10 – 2022-10-15 (×6): 5 mg via ORAL
  Filled 2022-10-10 (×7): qty 1

## 2022-10-10 MED ORDER — NYSTATIN 100000 UNIT/ML MT SUSP
5.0000 mL | Freq: Four times a day (QID) | OROMUCOSAL | Status: DC
  Administered 2022-10-10 – 2022-10-15 (×17): 500000 [IU] via ORAL
  Filled 2022-10-10 (×17): qty 5

## 2022-10-10 MED ORDER — ENOXAPARIN SODIUM 40 MG/0.4ML IJ SOSY
40.0000 mg | PREFILLED_SYRINGE | INTRAMUSCULAR | Status: DC
  Filled 2022-10-10 (×2): qty 0.4

## 2022-10-10 NOTE — Evaluation (Signed)
Occupational Therapy Evaluation Patient Details Name: MACKLYNN ABDELMALEK MRN: [id number] DOB: 09-21-1955 Today's Date: 10/10/2022   History of Present Illness 67 y/o F admitted to [facility] on 7/4 for worsening shortness of breath. Pt with recent admission 6/29 - 7/2 and 6/4 - 6/12 for COPD exacerbations. PMHx: COPD on home O2, HFrEF, numerous hospitalizations for COPD exacerbation, CAD, HTN, anxiety/depression, SAH   Clinical Impression   At baseline, pt completes ADLs Independent to Mod assist and functional mobility household distances with a Rollator with Mod I. Pt also reports furniture surfing in the home and recent falls. At baseline, pt's functional level fluctuates depending on pt's level of fatigue and SOB. Pt is on 2L to 3L of continuous O2 through [place] at baseline. Pt receives assistance from husband for ADLs as needed, IADLs, and transportation. Pt now presents with decreased activity tolerance, decreased standing balance during functional tasks, generalized B UE weakness, and decreased safety and independence with ADLs and functional transfers/mobility. Pt currently demonstrates ability to complete UB ADLs Independent to Min assist, LB ADLs Min to Mod assist, and stand-pivot transfers with Min assist with hand hold assist +1. Pt will benefit from acute skilled OT services to address deficits outlined below, decrease caregiver burden, and increase safety and independence with ADLs, functional transfers, and functional mobility. Post acute discharge, pt will benefit from continued skilled OT services in the home.      Recommendations for follow up therapy are one component of a multi-disciplinary discharge planning process, led by the attending physician.  Recommendations may be updated based on patient status, additional functional criteria and insurance authorization.   Assistance Recommended at Discharge Frequent or constant Supervision/Assistance  Patient can return home with the following A  little help with walking and/or transfers;A lot of help with bathing/dressing/bathroom;Assistance with cooking/housework;Direct supervision/assist for medications management;Direct supervision/assist for financial management;Assist for transportation;Help with stairs or ramp for entrance    Functional Status Assessment  Patient has had a recent decline in their functional status and demonstrates the ability to make significant improvements in function in a reasonable and predictable amount of time.  Equipment Recommendations  Tub/shower seat    Recommendations for Other Services       Precautions / Restrictions Precautions Precautions: Fall;Other (comment) Precaution Comments: watch O2 Restrictions Weight Bearing Restrictions: No      Mobility Bed Mobility Overal bed mobility: Needs Assistance Bed Mobility: Supine to Sit, Sit to Supine     Supine to sit: Supervision, HOB elevated Sit to supine: Supervision, HOB elevated   General bed mobility comments: supervision for safety and line management    Transfers Overall transfer level: Needs assistance Equipment used: 1 person hand held assist Transfers: Sit to/from Stand, Bed to chair/wheelchair/BSC Sit to Stand: Min assist Stand pivot transfers: Min assist         General transfer comment: minA to power up and steady wtih 1HHA, pivoting to/from [facility]. Assist needed for balance with peri-care      Balance Overall balance assessment: Needs assistance Sitting-balance support: Feet supported Sitting balance-Leahy Scale: Good     Standing balance support: Single extremity supported, During functional activity Standing balance-Leahy Scale: Poor Standing balance comment: reliant on UE support for standing and pivoting                           ADL either performed or assessed with clinical judgement   ADL Overall ADL's : Needs assistance/impaired Eating/Feeding: Independent;Sitting   Grooming:  Independent;Sitting   Upper Body Bathing: Minimal assistance;Sitting   Lower Body Bathing: Moderate assistance;Sit to/from stand   Upper Body Dressing : Min guard;Sitting (assistance for line management)   Lower Body Dressing: Moderate assistance;Sit to/from stand   Toilet Transfer: Minimal assistance;Stand-pivot;BSC/3in1 (hand held assist +1)   Toileting- Clothing Manipulation and Hygiene: Minimal assistance;Sit to/from stand Toileting - Clothing Manipulation Details (indicate cue type and reason): Min assist for hygeine and to steady in standing       General ADL Comments: Pt fatigues quickly during ADLs, requiring frequent rest breaks.     Vision Baseline Vision/History: 4 Cataracts Patient Visual Report: No change from baseline       Perception     Praxis Praxis Praxis tested?: Within functional limits    Pertinent Vitals/Pain Pain Assessment Pain Assessment: No/denies pain     Hand Dominance Right   Extremity/Trunk Assessment Upper Extremity Assessment Upper Extremity Assessment: Generalized weakness   Lower Extremity Assessment Lower Extremity Assessment: Defer to PT evaluation   Cervical / Trunk Assessment Cervical / Trunk Assessment: Normal   Communication Communication Communication: No difficulties   Cognition Arousal/Alertness: Awake/alert Behavior During Therapy: Anxious Overall Cognitive Status: Within Functional Limits for tasks assessed                                 General Comments: Pt pleasant during session but becomes anxious with mobility. Pt likes to have her fan nearby. Enjoys joking but becomes emotional when thinking about her prognosis.     General Comments  Pt on 2L continuous O2 through [place] upon arrival. Pt requesting to be placed on 3L prior to transfer from EOB to [facility]. Pt with brief desat to 80s with stnad-pivot transfer and otherwise maintaining O2 sat in 90s throughout session. However, pt fatigues quickly and  reports feeling SOB with frequent rest breaks required. Pt reported mild lightheadedness when sitting on BSC with pt BP 173/94, decreasing to 148/73 once returned to supine in bed with HOB elevated. Pt's son and DIL present through a portion of session.    Exercises     Shoulder Instructions      Home Living Family/patient expects to be discharged to:: Private residence Living Arrangements: Spouse/significant other Available Help at Discharge: Family;Available 24 hours/day Type of Home: Apartment Home Access: Stairs to enter Entrance Stairs-Number of Steps: 14 Entrance Stairs-Rails: Left Home Layout: One level     Bathroom Shower/Tub: Chief Strategy Officer: Standard     Home Equipment: Agricultural consultant (2 wheels);Rollator (4 wheels);Transport chair (neighbor has transport chair that pt utilizes)   Additional Comments: 2-3L O2 at baseline      Prior Functioning/Environment Prior Level of Function : Needs assist;History of Falls (last six months)             Mobility Comments: ambulates household distances with intermittent use of rollator, furniture surfs as well, admits to a few falls in the past 6 months. ADLs Comments: Pt's husband assists with bathing of back, and intermittent assist for dressing, husband helps with household tasks and provides transportation. Pt and husband set up medication box together each week. Pt reports that amount of assist that she needs varies day by day depending on level of fatigue and SOB        OT Problem List: Decreased strength;Decreased activity tolerance;Impaired balance (sitting and/or standing)      OT Treatment/Interventions: Self-care/ADL training;Therapeutic exercise;Energy conservation;Therapeutic activities;Patient/family education;Balance  training    OT Goals(Current goals can be found in the care plan section) Acute Rehab OT Goals Patient Stated Goal: To return home with family OT Goal Formulation: With  patient Time For Goal Achievement: 10/24/22 Potential to Achieve Goals: Good ADL Goals Pt Will Perform Grooming: with modified independence;sitting (to complete 3 tasks sitting EOB for 5 or more minutes) Pt Will Transfer to Toilet: with modified independence;ambulating;bedside commode (with least restrictive AD) Pt Will Perform Toileting - Clothing Manipulation and hygiene: with modified independence;sit to/from stand Additional ADL Goal #1: Patient will demonstrate ability to Independently state 4 energy conservation strategies to increase safety and independence with ADLs and functional mobility in the home.  OT Frequency: Min 2X/week    Co-evaluation              AM-PAC OT "6 Clicks" Daily Activity     Outcome Measure Help from another person eating meals?: None Help from another person taking care of personal grooming?: None (in sitting) Help from another person toileting, which includes using toliet, bedpan, or urinal?: A Little Help from another person bathing (including washing, rinsing, drying)?: A Lot Help from another person to put on and taking off regular upper body clothing?: A Little Help from another person to put on and taking off regular lower body clothing?: A Lot 6 Click Score: 18   End of Session Equipment Utilized During Treatment: Oxygen;Other (comment) [facility]) Nurse Communication: Mobility status;Other (comment) (Pt had BM. Pt requesting breathing treatment.)  Activity Tolerance: Patient limited by fatigue Patient left: in bed;with call bell/phone within reach;with bed alarm set  OT Visit Diagnosis: Unsteadiness on feet (R26.81);History of falling (Z91.81);Muscle weakness (generalized) (M62.81)                Time: 1610-9604 OT Time Calculation (min): 31 min Charges:  OT General Charges $OT Visit: 1 Visit OT Evaluation $OT Eval Moderate Complexity: 1 Mod OT Treatments $Self Care/Home Management : 8-22 mins  Freeman Borba "Orson Eva., OTR/L, MA Acute  Rehab [phone]   Lendon Colonel 10/10/2022, 4:45 PM

## 2022-10-10 NOTE — TOC Initial Note (Signed)
Transition of Care [facility]) - Initial/Assessment Note    Patient Details  Name: Kim [name] MRN: [id number] Date of Birth: 1956-01-17  Transition of Care [facility]) CM/SW Contact:    Gordy Clement, RN Phone Number: 10/10/2022, 3:43 PM  Clinical Narrative:           Patient is from home with Husband who is a very supportive caregiver- Patient requires assistance with ADL's etc. Patient has a rollator and a BSC in the home.   Patient is on 2L of O2 at baseline , provided by [employer]. Patient states she will turn it up to 3L when walking or maybe 4L when bathing. Husband will bring portable O2 for dc and transportation to home.    Patient had a recent hospitalization 6/5-6/13 and just a week ago.    She was recommended outpatient therapy from previous admission and was set up at the [facility] location.   TOC will continue to follow patient for any additional discharge needs           Patient Goals and CMS Choice            Expected Discharge Plan and Services                                              Prior Living Arrangements/Services                       Activities of Daily Living Home Assistive Devices/Equipment: Bedside commode/3-in-1, Walker (specify type), Oxygen, Nebulizer, BIPAP ADL Screening (condition at time of admission) Patient's cognitive ability adequate to safely complete daily activities?: Yes Is the patient deaf or have difficulty hearing?: No Does the patient have difficulty seeing, even when wearing glasses/contacts?: No Does the patient have difficulty concentrating, remembering, or making decisions?: No Patient able to express need for assistance with ADLs?: Yes Does the patient have difficulty dressing or bathing?: Yes Independently performs ADLs?: No Communication: Independent Dressing (OT): Independent Grooming: Needs assistance Is this a change from baseline?: Pre-admission baseline Feeding: Independent Bathing: Needs  assistance Is this a change from baseline?: Pre-admission baseline Toileting: Needs assistance Is this a change from baseline?: Pre-admission baseline Walks in Home: Independent Does the patient have difficulty walking or climbing stairs?: Yes Weakness of Legs: None Weakness of Arms/Hands: None  Permission Sought/Granted                  Emotional Assessment              Admission diagnosis:  Hypercarbia [R06.89] Respiratory distress [R06.03] COPD exacerbation (HCC) [J44.1] COPD with acute exacerbation (HCC) [J44.1] Patient Active Problem List   Diagnosis Date Noted   COPD with acute exacerbation (HCC) 10/09/2022   AKI (acute kidney injury) (HCC) 10/09/2022   COPD exacerbation (HCC) 09/11/2022   Acute on chronic respiratory failure with hypoxia and hypercapnia (HCC) 09/10/2022   Myalgia due to statin 06/05/2022   Medication reaction 02/12/2022   NSVT (nonsustained ventricular tachycardia) (HCC) 10/11/2021   Hyperkalemia 09/22/2021   SOB (shortness of breath) 09/19/2021   Chronic respiratory failure with hypoxia and hypercapnia (HCC)    DNR (do not resuscitate) 09/17/2021   Double vision 08/26/2021   Stage 3a chronic kidney disease (CKD) (HCC) 08/26/2021   Postconcussive syndrome 08/26/2021   Elevated troponin 08/26/2021   Subarachnoid hemorrhage (HCC) 08/26/2021   Syncope  and collapse 08/26/2021   NSTEMI (non-ST elevated myocardial infarction) (HCC) 11/17/2020   Osteoporosis 06/05/2020   Chronic systolic CHF (congestive heart failure) (HCC) 03/24/2020   Essential hypertension 03/24/2020   Vertebral fracture, osteoporotic (HCC) 03/05/2020   History of colonic polyps    Benign neoplasm of ascending colon    Neuritis 01/29/2017   Alpha-1-antitrypsin deficiency carrier 01/19/2017   Dependent edema 01/14/2017   Orthostatic hypotension    Allergic rhinitis 08/10/2015   Hyperglycemia    Coronary artery calcification seen on CAT scan 07/02/2015   Solitary  pulmonary nodule 12/02/2013   Loss of weight 11/10/2013   Osteoarthritis 06/05/2009   Brachial neuritis or radiculitis 06/05/2009   Dyslipidemia 03/12/2009   NEPHROLITHIASIS 01/05/2008   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   PARESTHESIA 08/24/2007   Anxiety and depression 11/19/2006   PCP:  Shirline Frees, NP Pharmacy:   [id number] [place], [place] - [address] AT [facility] [address] [facility] [address] [place] [address] Phone: [phone] Fax: [phone]     Social Determinants of Health (SDOH) Social History: SDOH Screenings   Food Insecurity: No Food Insecurity (10/09/2022)  Housing: Patient Declined (10/09/2022)  Transportation Needs: No Transportation Needs (10/09/2022)  Utilities: Not At Risk (10/09/2022)  Alcohol Screen: Low Risk  (03/22/2020)  Depression (PHQ2-9): Medium Risk (09/10/2021)  Financial Resource Strain: High Risk (09/10/2021)  Physical Activity: Inactive (03/25/2021)  Social Connections: Moderately Isolated (03/25/2021)  Stress: No Stress Concern Present (09/10/2021)  Tobacco Use: Medium Risk (10/09/2022)   SDOH Interventions:     Readmission Risk Interventions    09/20/2021    3:27 PM  Readmission Risk Prevention Plan  Transportation Screening Complete  Medication Review (RN Care Manager) Complete  PCP or Specialist appointment within 3-5 days of discharge Complete  HRI or Home Care Consult Complete  SW Recovery Care/Counseling Consult Complete  Palliative Care Screening Not Applicable  Skilled Nursing Facility Not Applicable

## 2022-10-10 NOTE — Progress Notes (Signed)
Daily Progress Note   Patient Name: Kim [name]       Date: 10/10/2022 DOB: Jun 15, 1955  Age: 67 y.o. MRN#: [id number] Attending Physician: Maretta Bees, MD Primary Care Physician: Shirline Frees, NP Admit Date: 10/09/2022  Reason for Consultation/Follow-up: Establishing goals of care  Subjective: I have reviewed medical records including EPIC notes, MAR, and labs. Received report from primary RN - no acute concerns.  Discussed case in detail with Pulm/Dr. Celine Mans in hallway.   We both went to visit patient at bedside - son/Bobby, DIL/Erin, and husband/Rodney present. Patient was lying in bed awake, alert, oriented, and able to participate in conversation. Mild conversational dyspnea noted. No signs or non-verbal gestures of pain or discomfort noted. No respiratory distress, increased work of breathing, or secretions noted.   Dr. Celine Mans provided updates and continued discussions related to recommendation for hospice at discharge.  Continued GOC with patient and her family (with permission) after Dr. Celine Mans exited room. Emotional support provided. Patient tells me "I am not afraid of dying - it's leaving my family." Allowed space and time for family to support her - they validated their love for her and that they would be ok. Patient does tell me "every time I come back (to the hospital) it gets worse." Patient continues to use the terms Palliative Care and hospice; however, I question if she knows the difference. Attempted to discuss the difference and patient declines - she states "I already know." I attempted to offer education to her family with patient present, but she states "I don't want my mind to go there yet. I don't want to hear it."  I attempted to again explore her worries/concerns.  Patient tells me "I don't want to always think 'is this the day I can't breath.'" Throughout conversation, it seems patient has misconceptions regarding hospice - attempted to provide education and accurate information but patient refuses discussion. She is agreeable for me to speak with her family in a separate space. [name] does not wish to join.  Patient continues to reiterate she does not want her medications adjusted by PMT.  Met with Reita Cliche and Denny Peon in separate private space. Emotional support provided.   Discussion on the difference between Palliative and Hospice care. Palliative care and hospice have similar goals of managing symptoms, promoting comfort, improving  quality of life, and maintaining a person's dignity. However, palliative care may be offered during any phase of a serious illness, while hospice care is usually offered when a person is expected to live for 6 months or less.  Provided education and counseling at length on the philosophy and benefits of hospice care. Discussed that it offers a holistic approach to care in the setting of end-stage illness, and is about supporting the patient where they are allowing nature to take it's course. Discussed the hospice team includes RNs, physicians, social workers, and chaplains. They can provide personal care, support for the family, and help keep patient out of the hospital as well as assist with DME needs for home hospice. Education provided on the difference between home vs residential hospice.   Family thinks patient believes "going into hospice" means going to a facility and getting a morphine drip as this was the experience of her friend. Detailed discussion regarding home vs residential hospice and symptom management at EOL was had.   Family understand patient's prognosis is poor. We discussed that time is limited no matter which path is chosen (aggressive vs comfort). I encouraged family to continue discussions with  patient on how she wishes to spend her last months - recurrent admissions vs focus on comfort/quality of life at home with family and hospice support.   Reita Cliche and Denny Peon are both in support of home hospice; however, are unsure if patient or Thereasa Distance will agree. They will continue discussions with them.  All questions and concerns addressed. Encouraged to call with questions and/or concerns. PMT card provided.  Length of Stay: 1  Current Medications: Scheduled Meds:   ALPRAZolam  0.5 mg Oral Q6H   arformoterol  15 mcg Nebulization BID   aspirin EC  81 mg Oral Daily   azithromycin  250 mg Oral Daily   bisoprolol  5 mg Oral Daily   budesonide (PULMICORT) nebulizer solution  0.5 mg Nebulization BID   docusate sodium  100 mg Oral BID   enoxaparin (LOVENOX) injection  30 mg Subcutaneous Q24H   ezetimibe  10 mg Oral Daily   guaiFENesin  1,200 mg Oral Q12H   ipratropium-albuterol  3 mL Nebulization TID   PARoxetine  40 mg Oral Daily   revefenacin  175 mcg Nebulization Daily   rosuvastatin  20 mg Oral Daily   sodium chloride flush  3 mL Intravenous Q12H   spironolactone  12.5 mg Oral Daily    Continuous Infusions:  ceFEPime (MAXIPIME) IV      PRN Meds: acetaminophen **OR** acetaminophen, albuterol, bisacodyl, hydrALAZINE, HYDROcodone-acetaminophen, HYDROmorphone, magnesium oxide, ondansetron **OR** ondansetron (ZOFRAN) IV, polyethylene glycol, polyvinyl alcohol, zolpidem  Physical Exam Vitals and nursing note reviewed.  Constitutional:      General: She is not in acute distress.    Appearance: She is ill-appearing.  Pulmonary:     Effort: No respiratory distress.     Comments: Mild conversational dyspnea Skin:    General: Skin is warm and dry.  Neurological:     Mental Status: She is alert and oriented to person, place, and time.     Motor: Weakness present.  Psychiatric:        Attention and Perception: Attention normal.        Behavior: Behavior is cooperative.         Cognition and Memory: Cognition and memory normal.             Vital Signs: BP (!) 143/76   Pulse 76  Temp 97.8 F (36.6 C) (Oral)   Resp (!) 22   Ht 5\' 1"  (1.549 m)   Wt 63.5 kg   SpO2 94%   BMI 26.45 kg/m  SpO2: SpO2: 94 % O2 Device: O2 Device: Nasal Cannula O2 Flow Rate: O2 Flow Rate (L/min): 3 L/min  Intake/output summary:  Intake/Output Summary (Last 24 hours) at 10/10/2022 1221 Last data filed at 10/10/2022 1117 Gross per 24 hour  Intake 0 ml  Output 700 ml  Net -700 ml   LBM:   Baseline Weight: Weight: 63.5 kg Most recent weight: Weight: 63.5 kg       Palliative Assessment/Data: PPS 50%      Patient Active Problem List   Diagnosis Date Noted   COPD with acute exacerbation (HCC) 10/09/2022   AKI (acute kidney injury) (HCC) 10/09/2022   COPD exacerbation (HCC) 09/11/2022   Acute on chronic respiratory failure with hypoxia and hypercapnia (HCC) 09/10/2022   Myalgia due to statin 06/05/2022   Medication reaction 02/12/2022   NSVT (nonsustained ventricular tachycardia) (HCC) 10/11/2021   Hyperkalemia 09/22/2021   SOB (shortness of breath) 09/19/2021   Chronic respiratory failure with hypoxia and hypercapnia (HCC)    DNR (do not resuscitate) 09/17/2021   Double vision 08/26/2021   Stage 3a chronic kidney disease (CKD) (HCC) 08/26/2021   Postconcussive syndrome 08/26/2021   Elevated troponin 08/26/2021   Subarachnoid hemorrhage (HCC) 08/26/2021   Syncope and collapse 08/26/2021   NSTEMI (non-ST elevated myocardial infarction) (HCC) 11/17/2020   Osteoporosis 06/05/2020   Chronic systolic CHF (congestive heart failure) (HCC) 03/24/2020   Essential hypertension 03/24/2020   Vertebral fracture, osteoporotic (HCC) 03/05/2020   History of colonic polyps    Benign neoplasm of ascending colon    Neuritis 01/29/2017   Alpha-1-antitrypsin deficiency carrier 01/19/2017   Dependent edema 01/14/2017   Orthostatic hypotension    Allergic rhinitis 08/10/2015    Hyperglycemia    Coronary artery calcification seen on CAT scan 07/02/2015   Solitary pulmonary nodule 12/02/2013   Loss of weight 11/10/2013   Osteoarthritis 06/05/2009   Brachial neuritis or radiculitis 06/05/2009   Dyslipidemia 03/12/2009   NEPHROLITHIASIS 01/05/2008   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 female  with past medical history of COPD on 2L [place] O2 and nocturnal BIPAP, SAH, HTN, CAD, and depression presented to ED on 10/09/22 from home with complaints of increasing shortness of breath. She was recently admitted 6/29-10/07/22 for COPD exacerbation - this is patient's third admission in 6 months for the same and she is a 7 day readmission. Patient was admitted on 10/09/2022 with COPD exacerbation and AKI on CKD 3a.   Assessment: Principal Problem:   COPD with acute exacerbation (HCC) Active Problems:   Dyslipidemia   Chronic systolic CHF (congestive heart failure) (HCC)   Essential hypertension   Stage 3a chronic kidney disease (CKD) (HCC)   DNR (do not resuscitate)   AKI (acute kidney injury) (HCC)   Concern about end of life  Recommendations/Plan: Continue current plan of care Continue DNR/DNI as previously documented Patient continues to refuse conversations regarding outpatient Palliative Care or hospice. Was able to discuss with family who will continue to encourage discussions Dilaudid PRN pain/dyspnea Ongoing GOC PMT will continue to follow and support holistically  Goals of Care and Additional Recommendations: Limitations on Scope of Treatment: Full Scope Treatment  Code Status:  Code Status Orders  (From admission, onward)           Start     Ordered   10/09/22 1248  Do not attempt resuscitation (DNR)  Continuous       Question Answer Comment  If patient has no pulse and is not breathing Do Not Attempt Resuscitation   If  patient has a pulse and/or is breathing: Medical Treatment Goals LIMITED ADDITIONAL INTERVENTIONS: Use medication/IV fluids and cardiac monitoring as indicated; Do not use intubation or mechanical ventilation (DNI), also provide comfort medications.  Transfer to Progressive/Stepdown as indicated, avoid Intensive Care.   Consent: Discussion documented in EHR or advanced directives reviewed      10/09/22 1250           Code Status History     Date Active Date Inactive Code Status Order ID Comments User Context   10/04/2022 1631 10/07/2022 1458 DNR [id number]  Coralie Keens, MD Inpatient   10/04/2022 1513 10/04/2022 1631 Full Code [id number]  Coralie Keens, MD ED   09/10/2022 1332 09/18/2022 2253 DNR [id number]  Synetta Fail, MD ED   09/10/2022 1246 09/10/2022 1332 Full Code [id number]  Synetta Fail, MD ED   09/19/2021 1531 09/24/2021 2040 DNR [id number]  Lucile Shutters, MD ED   09/19/2021 1531 09/19/2021 1531 Full Code [id number]  Lucile Shutters, MD ED   09/17/2021 1009 09/18/2021 2018 DNR [id number]  Jonah Blue, MD ED   08/26/2021 0756 08/29/2021 2137 Full Code [id number]  Clydie Braun, MD ED   06/26/2021 1604 06/30/2021 2150 Full Code [id number]  Clydie Braun, MD Inpatient   11/15/2020 1850 11/20/2020 1815 Full Code [id number]  Lonia Blood, MD ED   03/24/2020 1630 04/03/2020 1721 DNR [id number]  Jae Dire, MD ED   07/11/2019 0348 07/13/2019 1713 Full Code [id number]  Inez Catalina, MD Inpatient   01/14/2017 1359 01/18/2017 1722 Full Code [id number]  Ozella Rocks, MD ED   12/03/2016 0816 12/05/2016 1443 Full Code [id number]  Marcos Eke, PA-C ED   07/05/2015 1319 07/08/2015 2131 Full Code [id number]  Meredith Pel, NP Inpatient   07/05/2015 0903 07/05/2015 1319 Full Code [id number]  Gwendolyn Fill, NP ED   06/24/2015 1442 06/28/2015 1636 Full Code [id number]  Gwenyth Bender, NP ED      Advance Directive Documentation    Flowsheet Row Most Recent Value   Type of Advance Directive Living will  Pre-existing out of facility DNR order (yellow form or pink MOST form) --  "MOST" Form in Place? --       Prognosis: Poor long term  Discharge Planning: To Be Determined  Care plan was discussed with primary RN, patient, patient's family, Dr. Celine Mans  Thank you for allowing the Palliative Medicine Team to assist in the care of this patient.   Total Time 70 minutes Prolonged Time Billed  yes       Greater than 50%  of this time was spent counseling and coordinating care related to the above assessment and plan.  Haskel Khan, NP  Please contact Palliative Medicine Team phone at [phone] for questions and concerns.   *Portions of this note are a verbal dictation therefore any spelling and/or grammatical errors are due to the "[employer]" system interpretation.

## 2022-10-10 NOTE — Consult Note (Signed)
NAME:  Kim [name], MRN:  [id number], DOB:  04-25-1955, LOS: 1 ADMISSION DATE:  10/09/2022, CONSULTATION DATE:  10/09/22 REFERRING MD:  Dr. 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    Patient followed in our office by Dr. Vassie Loll.  Last PFTs 2016 with FVC 63%,  FEV1 41%, negative bronchodilator response, DLCO uncorrected 51%; has refused PFTs since cause she doesn't want to see how bad it is.  Unfortunately this is her third admission for AECOPD since June 2024, but prior to that made it one year without hospitalizations.  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  Admitted again 6/29 to 7/2 after issues with her air conditioner going out a home.  Pt reports did not feel back to her baseline at 7/2 prior to going home.  Reports compliance with her home symbicort, duonebs, NIV, and taking her prednisone, scheduled to decrease to 30mg  today.  Used NIV for around 6hrs last night but woke up not feeling well, denies orthopnea, followed by severe shortness of breath sitting up despite taking xanax and nebs x 2 without relief, husband called EMS.  She was found to be in severe respiratory distress, treated with CPAP, magnesium, epi IM, solumedrol, and nebs.  She denies any recent fever or chills, sputum production, LE edema, or sick exposures.  She reports worsening exercise tolerance related to her SOB.   In [place], she was placed on BiPAP and given nebs, empiric abx, and ativan 0.5mg .  CXR showing bibasilar opacities, most likely atelectasis but afebrile, and on her baseline 2-3 O2 [place].  She since has been able to transition off BiPAP.  Palliative care consulted but patient was not ready to discuss or want to consider adding new medications given previous  history of multiple reactions with different meds however confirms her DNR/ DNI status.  Pulmonary consulted given recurrent AECOPD exacerbations.  Pertinent  Medical History  Former tobacco abuse, COPD/ asthma, chronic hypoxic/ hypercarbic respiratory failure on HOT 2L and NIV, HFpEF, takotsubo cardiomyopathy, CAD, HTN, anxiety/ depression, SAH  Significant Hospital Events: Including procedures, antibiotic start and stop dates in addition to other pertinent events   7/2 discharged home 7/4 readmitted   Interim History / Subjective:  Is marginally better today Objective   Blood pressure (!) 143/76, pulse 76, temperature 97.8 F (36.6 C), temperature source Oral, resp. rate (!) 22, height 5\' 1"  (1.549 m), weight 63.5 kg, SpO2 94 %.    FiO2 (%):  [40 %] 40 %   Intake/Output Summary (Last 24 hours) at 10/10/2022 1000 Last data filed at 10/10/2022 0600 Gross per 24 hour  Intake 0 ml  Output --  Net 0 ml   Filed Weights   10/09/22 0935  Weight: 63.5 kg   Exam Frail elderly female with increased work of breathing JVD is appreciated Chest wall paradoxus is noted, short of breath at rest no overt wheezing is noted Heart sounds are distant Abdomen soft nontender eating hamate and cheese biscuit Voids Extremities are warm No intake and output is noted  Resolved Hospital Problem list    Assessment & Plan:   Recurrent AECOPD with suspected asthma overlap, end-stage Chronic hypoxic and hypercarbic respiratory failure on HOT 2-3L and home NIV just discharged 7/2  for same.  Reported compliance with NIV, O2, nebs, and prednisone (on 40mg  at home yesterday).  No change in cough/ secretions, denies fever chills but PCT is 0.43 Suspect her severe anxiety is a large contribution to her exacerbations and ongoing BB/ bisoprolol could be contributing. P: She has been restarted on her beta-blocker, bisoprolol Diuresis as tolerated Continue antibiotics with cefepime and Zithromax Continue  bronchodilators Pulmonary toilet Further evaluation per palliative medicine team         Remainder per primary team.  PCCM will continue to follow.    Best Practice (right click and "Reselect all SmartList Selections" daily)   Diet/type: Regular consistency (see orders) DVT prophylaxis: LMWH GI prophylaxis: PPI Lines: N/A Foley:  N/A Code Status:  DNR Last date of multidisciplinary goals of care discussion per primary   Labs   CBC: Recent Labs  Lab 10/04/22 1200 10/05/22 0408 10/06/22 0552 10/09/22 0945 10/10/22 0419  WBC 15.3* 11.3* 19.6*  --  19.1*  NEUTROABS  --   --  16.5*  --   --   HGB 14.5 13.0 11.9* 12.6 11.1*  HCT 46.4* 41.6 36.7 37.0 35.1*  MCV 99.4 97.4 95.8  --  101.2*  PLT 225 220 236  --  222    Basic Metabolic Panel: Recent Labs  Lab 10/04/22 1200 10/05/22 0408 10/06/22 0552 10/09/22 0939 10/09/22 0945 10/10/22 0419  NA 140 138 138 141 137 137  K 3.7 3.8 4.0 4.3 4.2 4.5  CL 98 97* 99 104  --  103  CO2 28 29 28 25   --  26  GLUCOSE 161* 222* 138* 319*  --  176*  BUN 27* 27* 28* 32*  --  34*  CREATININE 1.23* 1.27* 1.26* 1.56*  --  1.22*  CALCIUM 10.3 10.6* 9.9 9.0  --  8.8*   GFR: Estimated Creatinine Clearance: 37.7 mL/min (A) (by C-G formula based on SCr of 1.22 mg/dL (H)). Recent Labs  Lab 10/04/22 1200 10/05/22 0408 10/06/22 0552 10/09/22 1241 10/10/22 0419  PROCALCITON  --   --   --  0.43  --   WBC 15.3* 11.3* 19.6*  --  19.1*    Liver Function Tests: Recent Labs  Lab 10/04/22 1200 10/09/22 0939  AST 23 34  ALT 29 32  ALKPHOS 69 66  BILITOT 0.8 0.5  PROT 6.4* 5.4*  ALBUMIN 3.6 2.9*   No results for input(s): "LIPASE", "AMYLASE" in the last 168 hours. No results for input(s): "AMMONIA" in the last 168 hours.  ABG    Component Value Date/Time   PHART 7.43 09/11/2022 1030   PCO2ART 48 09/11/2022 1030   PO2ART 89 09/11/2022 1030   HCO3 27.3 10/09/2022 0945   TCO2 29 10/09/2022 0945   ACIDBASEDEF 1.0 10/09/2022  0945   O2SAT 68 10/09/2022 0945     Coagulation Profile: No results for input(s): "INR", "PROTIME" in the last 168 hours.  Cardiac Enzymes: No results for input(s): "CKTOTAL", "CKMB", "CKMBINDEX", "TROPONINI" in the last 168 hours.  HbA1C: Hgb A1c MFr Bld  Date/Time Value Ref Range Status  07/14/2022 04:05 PM 6.1 (H) 4.8 - 5.6 % Final    Comment:             Prediabetes: 5.7 - 6.4          Diabetes: >6.4          Glycemic control for adults with diabetes: <7.0   03/22/2018 07:57 AM 6.0 4.6 - 6.5 % Final    Comment:  Glycemic Control Guidelines for People with Diabetes:Non Diabetic:  <6%Goal of Therapy: <7%Additional Action Suggested:  >8%     CBG: No results for input(s): "GLUCAP" in the last 168 hours.     Brett Canales Faiza Bansal ACNP Acute Care Nurse Practitioner [facility] Please consult Amion 10/10/2022, 10:00 AM

## 2022-10-10 NOTE — Discharge Instructions (Signed)

## 2022-10-10 NOTE — Progress Notes (Signed)
Initial Nutrition Assessment  DOCUMENTATION CODES:   Not applicable  INTERVENTION:  Continue regular diet as ordered Boost Plus po BID, each supplement provides 360 kcal and 14 grams of protein MVI with minerals daily Coupons provided for Ensure at home "High Calorie, High Protein Nutrition Therapy" handout added to AVS  NUTRITION DIAGNOSIS:   Increased nutrient needs related to chronic illness (COPD) as evidenced by estimated needs.  GOAL:   Patient will meet greater than or equal to 90% of their needs  MONITOR:   PO intake, Supplement acceptance, Labs, Weight trends  REASON FOR ASSESSMENT:   Consult Other (Comment) (nutrition goals)  ASSESSMENT:   Pt admitted with COPD exacerbation. PMH significant for 2 prior admissions this month d/t COPD, chronic hypoxic/hypercarbic respiratory failure.  Spoke with pt at bedside. She is connected to CPAP at time of visit and noticeably winded while talking. She mentions eating about 3 times daily at home however has noticed her intake declining within the last month. She denies difficulty chewing/swallowing but has had some difficulty with intake d/t breathing difficulties. She has been consuming on average 1 Boost daily and is agreeable to consuming during admission. She is not opposed to consuming Ensure these are just her preference. Provided coupons for home use to aid in cost of supplements.   No documented meal completions on file to review.   Pt endorses about a 10 lb weight loss within the last month d/t  COPD exacerbation and recurrent admissions. Unfortunately, cannot confirm weight loss based on documented weight history. Overall within the last year, weight documentation has remained 63-64 kg.  Medications: zithromax, colace, lasix 40mg  once, IV abx  Labs: BUN 34, Cr 1.22, GFR 48  NUTRITION - FOCUSED PHYSICAL EXAM: Pt likely with a degree of malnutrition based on report of decreased PO intake and noted muscle depletion on  nutrition focused physical exam. Will attempt to assess further on follow up. Flowsheet Row Most Recent Value  Orbital Region Unable to assess  [CPAP]  Upper Arm Region Mild depletion  Thoracic and Lumbar Region No depletion  Buccal Region Unable to assess  Temple Region No depletion  Clavicle Bone Region No depletion  Clavicle and Acromion Bone Region No depletion  Scapular Bone Region No depletion  Dorsal Hand Mild depletion  Patellar Region Moderate depletion  Anterior Thigh Region Moderate depletion  Posterior Calf Region No depletion  Edema (RD Assessment) None  Hair Reviewed  Eyes Reviewed  Mouth Unable to assess  Skin Reviewed  Nails Reviewed      Diet Order:   Diet Order             Diet regular Room service appropriate? Yes; Fluid consistency: Thin  Diet effective now                   EDUCATION NEEDS:   Education needs have been addressed  Skin:  Skin Assessment: Reviewed RN Assessment  Last BM:  7/5 (type 1 medium)  Height:   Ht Readings from Last 1 Encounters:  10/09/22 5\' 1"  (1.549 m)    Weight:   Wt Readings from Last 1 Encounters:  10/09/22 63.5 kg   MI:  Body mass index is 26.45 kg/m.  Estimated Nutritional Needs:   Kcal:  1600-1800  Protein:  80-95g  Fluid:  >/=1.6L  Drusilla Kanner, RDN, LDN Clinical Nutrition

## 2022-10-10 NOTE — Progress Notes (Signed)
PROGRESS NOTE        PATIENT DETAILS Name: Kim [name] Age: 67 y.o. Sex: female Date of Birth: 1955/05/05 Admit Date: 10/09/2022 Admitting Physician Jonah Blue, MD BJY:[id number], Kandee Keen, NP  Brief Summary: Patient is a 67 y.o.  female with history of COPD on home O2, HFrEF-numerous recent hospitalizations for COPD exacerbation-just discharged from this facility on 7/2-readmitted on 7/4 for worsening shortness of breath.  Significant events: 7/4>> admit to [facility]  Significant studies: 7/4>> CXR: No PNA  Significant microbiology data: None  Procedures: None  Consults: PCCM Palliative care  Subjective: Lying comfortably in bed-denies any chest pain or shortness of breath.  Not wheezing-no chest pain.  Tearful-significant anxiety component.  Afraid that she will have to come back to the hospital again when she is discharged.  Objective: Vitals: Blood pressure (!) 143/76, pulse 76, temperature 97.8 F (36.6 C), temperature source Oral, resp. rate (!) 22, height 5\' 1"  (1.549 m), weight 63.5 kg, SpO2 94 %.   Exam: Gen Exam:Alert awake-not in any distress HEENT:atraumatic, normocephalic Chest: B/L clear to auscultation anteriorly CVS:S1S2 regular Abdomen:soft non tender, non distended Extremities:no edema Neurology: Non focal Skin: no rash  Pertinent Labs/Radiology:    Latest Ref Rng & Units 10/10/2022    4:19 AM 10/09/2022    9:45 AM 10/06/2022    5:52 AM  CBC  WBC 4.0 - 10.5 K/uL 19.1   19.6   Hemoglobin 12.0 - 15.0 g/dL 30.8  65.7  84.6   Hematocrit 36.0 - 46.0 % 35.1  37.0  36.7   Platelets 150 - 400 K/uL 222   236     Lab Results  Component Value Date   NA 137 10/10/2022   K 4.5 10/10/2022   CL 103 10/10/2022   CO2 26 10/10/2022      Assessment/Plan: Acute on chronic hypoxic respiratory failure-secondary to COPD exacerbation with significant anxiety component Already better this morning-no wheezing Agree with PCCM-significant  component of anxiety Thought to have very advanced/end-stage COPD Palliative care following for symptom management/goals of care discussion In the interim-continue slow steroid taper, bronchodilators and other medications for management of anxiety  Chronic HFpEF Euvolemic Given recurrent hospitalizations-will aim to keep her in a negative balance Continue Aldactone-restart low-dose Lasix Restart beta-blocker She was on Farxiga-this is temporarily held for now-we can resume over the next several days She unfortunately has had issues with losartan with hypotension-and hence we will avoid Entresto for now  HTN BP stable Aldactone/Lasix/bisoprolol  AKI on CKD stage IIIb Creatinine improved-AKI mild-likely hemodynamically mediated Restarting diuretics-watch closely  HLD Statin/Zetia  Anxiety/depression Significant anxiety component contributing to her shortness of breath As needed Xanax Schedule Paxil  OSA CPAP nightly and when sleeping/prolonged naps during the daytime  Palliative care DNR in place Multiple recurrent hospitalizations recently Significant anxiety component Thought to have end-stage COPD-has HFrEF Palliative care following-will await for further recommendations  BMI: Estimated body mass index is 26.45 kg/m as calculated from the following:   Height as of this encounter: 5\' 1"  (1.549 m).   Weight as of this encounter: 63.5 kg.   Code status:   Code Status: DNR   DVT Prophylaxis: enoxaparin (LOVENOX) injection 30 mg Start: 10/09/22 1300   Family Communication: Spouse at bedside   Disposition Plan: Status is: Inpatient Remains inpatient appropriate because: Severity of illness   Planned Discharge Destination:Home health  versus home with hospice care-ongoing goals of care discussion   Diet: Diet Order             Diet regular Room service appropriate? Yes; Fluid consistency: Thin  Diet effective now                     Antimicrobial  agents: Anti-infectives (From admission, onward)    Start     Dose/Rate Route Frequency Ordered Stop   10/10/22 1115  ceFEPIme (MAXIPIME) 2 g in sodium chloride 0.9 % 100 mL IVPB        2 g 200 mL/hr over 30 Minutes Intravenous Every 12 hours 10/10/22 1104 10/13/22 2359   10/09/22 1815  azithromycin (ZITHROMAX) tablet 250 mg        250 mg Oral Daily 10/09/22 1721     10/09/22 1300  ceFEPIme (MAXIPIME) 2 g in sodium chloride 0.9 % 100 mL IVPB  Status:  Discontinued        2 g 200 mL/hr over 30 Minutes Intravenous Every 24 hours 10/09/22 1250 10/10/22 1104        MEDICATIONS: Scheduled Meds:  ALPRAZolam  0.5 mg Oral Q6H   arformoterol  15 mcg Nebulization BID   aspirin EC  81 mg Oral Daily   azithromycin  250 mg Oral Daily   bisoprolol  5 mg Oral Daily   budesonide (PULMICORT) nebulizer solution  0.5 mg Nebulization BID   docusate sodium  100 mg Oral BID   enoxaparin (LOVENOX) injection  30 mg Subcutaneous Q24H   ezetimibe  10 mg Oral Daily   guaiFENesin  1,200 mg Oral Q12H   ipratropium-albuterol  3 mL Nebulization TID   PARoxetine  40 mg Oral Daily   revefenacin  175 mcg Nebulization Daily   rosuvastatin  20 mg Oral Daily   sodium chloride flush  3 mL Intravenous Q12H   spironolactone  12.5 mg Oral Daily   Continuous Infusions:  ceFEPime (MAXIPIME) IV     PRN Meds:.acetaminophen **OR** acetaminophen, albuterol, bisacodyl, hydrALAZINE, HYDROcodone-acetaminophen, magnesium oxide, ondansetron **OR** ondansetron (ZOFRAN) IV, polyethylene glycol, polyvinyl alcohol, zolpidem   I have personally reviewed following labs and imaging studies  LABORATORY DATA: CBC: Recent Labs  Lab 10/04/22 1200 10/05/22 0408 10/06/22 0552 10/09/22 0945 10/10/22 0419  WBC 15.3* 11.3* 19.6*  --  19.1*  NEUTROABS  --   --  16.5*  --   --   HGB 14.5 13.0 11.9* 12.6 11.1*  HCT 46.4* 41.6 36.7 37.0 35.1*  MCV 99.4 97.4 95.8  --  101.2*  PLT 225 220 236  --  222    Basic Metabolic  Panel: Recent Labs  Lab 10/04/22 1200 10/05/22 0408 10/06/22 0552 10/09/22 0939 10/09/22 0945 10/10/22 0419  NA 140 138 138 141 137 137  K 3.7 3.8 4.0 4.3 4.2 4.5  CL 98 97* 99 104  --  103  CO2 28 29 28 25   --  26  GLUCOSE 161* 222* 138* 319*  --  176*  BUN 27* 27* 28* 32*  --  34*  CREATININE 1.23* 1.27* 1.26* 1.56*  --  1.22*  CALCIUM 10.3 10.6* 9.9 9.0  --  8.8*    GFR: Estimated Creatinine Clearance: 37.7 mL/min (A) (by C-G formula based on SCr of 1.22 mg/dL (H)).  Liver Function Tests: Recent Labs  Lab 10/04/22 1200 10/09/22 0939  AST 23 34  ALT 29 32  ALKPHOS 69 66  BILITOT 0.8 0.5  PROT 6.4* 5.4*  ALBUMIN 3.6 2.9*   No results for input(s): "LIPASE", "AMYLASE" in the last 168 hours. No results for input(s): "AMMONIA" in the last 168 hours.  Coagulation Profile: No results for input(s): "INR", "PROTIME" in the last 168 hours.  Cardiac Enzymes: No results for input(s): "CKTOTAL", "CKMB", "CKMBINDEX", "TROPONINI" in the last 168 hours.  BNP (last 3 results) No results for input(s): "PROBNP" in the last 8760 hours.  Lipid Profile: No results for input(s): "CHOL", "HDL", "LDLCALC", "TRIG", "CHOLHDL", "LDLDIRECT" in the last 72 hours.  Thyroid Function Tests: No results for input(s): "TSH", "T4TOTAL", "FREET4", "T3FREE", "THYROIDAB" in the last 72 hours.  Anemia Panel: No results for input(s): "VITAMINB12", "FOLATE", "FERRITIN", "TIBC", "IRON", "RETICCTPCT" in the last 72 hours.  Urine analysis:    Component Value Date/Time   COLORURINE YELLOW 08/26/2021 0805   APPEARANCEUR CLEAR 08/26/2021 0805   LABSPEC 1.021 08/26/2021 0805   PHURINE 5.0 08/26/2021 0805   GLUCOSEU NEGATIVE 08/26/2021 0805   HGBUR NEGATIVE 08/26/2021 0805   HGBUR trace-lysed 02/26/2010 0951   BILIRUBINUR NEGATIVE 08/26/2021 0805   BILIRUBINUR negative 04/29/2021 1243   KETONESUR NEGATIVE 08/26/2021 0805   PROTEINUR NEGATIVE 08/26/2021 0805   UROBILINOGEN 0.2 04/29/2021 1243    UROBILINOGEN 0.2 02/26/2010 0951   NITRITE NEGATIVE 08/26/2021 0805   LEUKOCYTESUR NEGATIVE 08/26/2021 0805    Sepsis Labs: Lactic Acid, Venous    Component Value Date/Time   LATICACIDVEN 2.1 (HH) 04/03/2020 0538    MICROBIOLOGY: Recent Results (from the past 240 hour(s))  SARS Coronavirus 2 by RT PCR (hospital order, performed in [facility] hospital lab) *cepheid single result test* Anterior Nasal Swab     Status: None   Collection Time: 10/04/22 12:00 PM   Specimen: Anterior Nasal Swab  Result Value Ref Range Status   SARS Coronavirus 2 by RT PCR NEGATIVE NEGATIVE Final    Comment: Performed at [facility], [address], [place], [place] [address]    RADIOLOGY STUDIES/RESULTS: DG Chest Portable 1 View  Result Date: 10/09/2022 CLINICAL DATA:  Shortness of breath EXAM: PORTABLE CHEST 1 VIEW COMPARISON:  Chest x-ray dated October 04, 2022 FINDINGS: Cardiac and mediastinal contours are unchanged. Emphysema. Mild bibasilar opacities likely due to scarring or atelectasis. No focal consolidation. No evidence of pneumothorax. No evidence of pleural effusion. IMPRESSION: Emphysema. No focal consolidation. Electronically Signed   By: Allegra Lai M.D.   On: 10/09/2022 10:04     LOS: 1 day   Jeoffrey Massed, MD  [facility]    To contact the attending provider between 7A-7P or the covering provider during after hours 7P-7A, please log into the web site [email or web address] and access using universal [place] password for that web site. If you do not have the password, please call the hospital operator.  10/10/2022, 11:16 AM

## 2022-10-10 NOTE — Evaluation (Signed)
Physical Therapy Evaluation Patient Details Name: Kim [name] MRN: [id number] DOB: 09-13-1955 Today's Date: 10/10/2022  History of Present Illness  67 y/o F admitted to [facility] on 7/4 for worsening shortness of breath. Pt with recent admission 6/29 - 7/2 and 6/5 - 6/13 for COPD exacerbations. PMHx: COPD on home O2, HFrEF, numerous hospitalizations for COPD exacerbation, CAD, HTN, anxiety/depression, SAH  Clinical Impression  Pt presents today with impaired functional mobility, limited by activity tolerance, strength, and balance. Pt has had several recent admissions for COPD exacerbation, noted pt to be anxious about mobility, but able to complete with frequent rest breaks and use of her fan. Pt tolerated pivoting to and from [facility] with minA and HHA for balance and support, sats maintaining fairly well on 3L O2 [place], with questionable desat briefly, however pt fatiguing easily. Pt has full time care at home from her husband, with supportive family. Pt will continue to benefit from skilled acute PT to progress activity tolerance and mobility, as pt ambulates household distances with and without rollator at baseline. Recommend HHPT and continued family support upon discharge. Will continue to follow as appropriate during admission.         Assistance Recommended at Discharge Frequent or constant Supervision/Assistance  If plan is discharge home, recommend the following:  Can travel by private vehicle  A little help with walking and/or transfers;A little help with bathing/dressing/bathroom;Assistance with cooking/housework;Assist for transportation;Help with stairs or ramp for entrance        Equipment Recommendations None recommended by PT  Recommendations for Other Services       Functional Status Assessment Patient has had a recent decline in their functional status and demonstrates the ability to make significant improvements in function in a reasonable and predictable amount of time.      Precautions / Restrictions Precautions Precautions: Fall;Other (comment) Precaution Comments: watch O2 Restrictions Weight Bearing Restrictions: No      Mobility  Bed Mobility Overal bed mobility: Needs Assistance Bed Mobility: Supine to Sit, Sit to Supine     Supine to sit: Supervision, HOB elevated Sit to supine: Supervision, HOB elevated   General bed mobility comments: supervision for safety and line management    Transfers Overall transfer level: Needs assistance Equipment used: 1 person hand held assist Transfers: Sit to/from Stand, Bed to chair/wheelchair/BSC Sit to Stand: Min assist Stand pivot transfers: Min assist         General transfer comment: minA to power up and steady wtih 1HHA, pivoting to/from [facility]. Assist needed for balance with peri-care    Ambulation/Gait               General Gait Details: unable to attempt today due to pt feeling SOB  Stairs            Wheelchair Mobility     Tilt Bed    Modified Rankin (Stroke Patients Only)       Balance Overall balance assessment: Needs assistance Sitting-balance support: Feet supported Sitting balance-Leahy Scale: Good     Standing balance support: Single extremity supported, During functional activity Standing balance-Leahy Scale: Poor Standing balance comment: reliant on UE support for standing and pivoting                             Pertinent Vitals/Pain Pain Assessment Pain Assessment: No/denies pain    Home Living Family/patient expects to be discharged to:: Private residence Living Arrangements: Spouse/significant other Available Help at  Discharge: Family;Available 24 hours/day Type of Home: Apartment Home Access: Stairs to enter Entrance Stairs-Rails: Left Entrance Stairs-Number of Steps: 14   Home Layout: One level Home Equipment: Agricultural consultant (2 wheels);Rollator (4 wheels);Transport chair (neighbor has transport chair that pt utilizes) Additional  Comments: 2-3L O2 at baseline    Prior Function Prior Level of Function : Needs assist;History of Falls (last six months)             Mobility Comments: ambulates household distances with intermittent use of rollator, furniture surfs as well, admits to a few falls in the past 6 months. ADLs Comments: husband assists with bathing of back, and intermittent assist for dressing, husband helps with household tasks and provides transportation     Hand Dominance   Dominant Hand: Right    Extremity/Trunk Assessment   Upper Extremity Assessment Upper Extremity Assessment: Defer to OT evaluation    Lower Extremity Assessment Lower Extremity Assessment: Generalized weakness    Cervical / Trunk Assessment Cervical / Trunk Assessment: Normal  Communication   Communication: No difficulties  Cognition Arousal/Alertness: Awake/alert Behavior During Therapy: Anxious Overall Cognitive Status: Within Functional Limits for tasks assessed                                 General Comments: pt pleasant during session but becomes anxious with mobility, likes to have her fan nearby. Enjoys joking but becomes emotional when thinking about her prognosis        General Comments General comments (skin integrity, edema, etc.): Pt on 2L upon arrival, requesting to be placed on 3L prior to mobility. Brief desat to 80s with mobility, maintaining in 90s throughout but pt feeling SOB with frequent rest breaks required. Once sitting on BSC, pt reported mild lightheadedness, BP ~170s/90s, decreasing to 140s/80s once back in supine    Exercises     Assessment/Plan    PT Assessment Patient needs continued PT services  PT Problem List Decreased strength;Decreased activity tolerance;Decreased balance;Decreased mobility;Decreased knowledge of use of DME;Decreased safety awareness;Decreased knowledge of precautions;Cardiopulmonary status limiting activity       PT Treatment Interventions DME  instruction;Gait training;Stair training;Therapeutic activities;Functional mobility training;Therapeutic exercise;Balance training;Neuromuscular re-education;Patient/family education    PT Goals (Current goals can be found in the Care Plan section)  Acute Rehab PT Goals Patient Stated Goal: go home PT Goal Formulation: With patient Time For Goal Achievement: 10/24/22 Potential to Achieve Goals: Fair    Frequency Min 2X/week     Co-evaluation               AM-PAC PT "6 Clicks" Mobility  Outcome Measure Help needed turning from your back to your side while in a flat bed without using bedrails?: A Little Help needed moving from lying on your back to sitting on the side of a flat bed without using bedrails?: A Little Help needed moving to and from a bed to a chair (including a wheelchair)?: A Little Help needed standing up from a chair using your arms (e.g., wheelchair or bedside chair)?: A Little Help needed to walk in hospital room?: A Little Help needed climbing 3-5 steps with a railing? : A Lot 6 Click Score: 17    End of Session Equipment Utilized During Treatment: Oxygen Activity Tolerance: Patient limited by fatigue Patient left: in bed;with call bell/phone within reach;with bed alarm set;with family/visitor present Nurse Communication: Mobility status PT Visit Diagnosis: Unsteadiness on feet (R26.81);Other abnormalities of  gait and mobility (R26.89);Difficulty in walking, not elsewhere classified (R26.2);History of falling (Z91.81);Muscle weakness (generalized) (M62.81)    Time: 0981-1914 PT Time Calculation (min) (ACUTE ONLY): 32 min   Charges:   PT Evaluation $PT Eval Moderate Complexity: 1 Mod   PT General Charges $$ ACUTE PT VISIT: 1 Visit         Lindalou Hose, PT DPT Acute Rehabilitation Services Office [phone]   Leonie Man 10/10/2022, 3:48 PM

## 2022-10-11 DIAGNOSIS — R0689 Other abnormalities of breathing: Secondary | ICD-10-CM | POA: Diagnosis not present

## 2022-10-11 DIAGNOSIS — J441 Chronic obstructive pulmonary disease with (acute) exacerbation: Secondary | ICD-10-CM | POA: Diagnosis not present

## 2022-10-11 DIAGNOSIS — R0603 Acute respiratory distress: Secondary | ICD-10-CM | POA: Diagnosis not present

## 2022-10-11 DIAGNOSIS — Z515 Encounter for palliative care: Secondary | ICD-10-CM | POA: Diagnosis not present

## 2022-10-11 MED ORDER — PREDNISONE 20 MG PO TABS
40.0000 mg | ORAL_TABLET | Freq: Every day | ORAL | Status: DC
  Administered 2022-10-12 – 2022-10-15 (×4): 40 mg via ORAL
  Filled 2022-10-11 (×4): qty 2

## 2022-10-11 MED ORDER — FUROSEMIDE 20 MG PO TABS
20.0000 mg | ORAL_TABLET | Freq: Every day | ORAL | Status: DC
  Administered 2022-10-12 – 2022-10-15 (×4): 20 mg via ORAL
  Filled 2022-10-11 (×4): qty 1

## 2022-10-11 NOTE — Progress Notes (Signed)
Daily Progress Note   Patient Name: Kim [name]       Date: 10/11/2022 DOB: 01/11/1956  Age: 67 y.o. MRN#: [id number] Attending Physician: Maretta Bees, MD Primary Care Physician: Shirline Frees, NP Admit Date: 10/09/2022  Reason for Consultation/Follow-up: Establishing goals of care  Subjective: I have reviewed medical records including EPIC notes, MAR, and labs. Received report from primary RN - no acute concerns.   Went to visit patient at bedside -son/Bobby and daughter-in-law/Erin present.  Patient was lying in bed awake, alert, oriented, and able to participate in conversation. No signs or non-verbal gestures of pain or discomfort noted. No respiratory distress, increased work of breathing, or secretions noted. She is chronically ill appearing. She is on 2L O2 [place].  Patient tells me she "feels so-so."  Emotional support provided to patient and family.  Patient tells me today that she is "ready to listen to the information."  Reita Cliche and Denny Peon have encouraged her to hear information.  Patient understands that no final decisions need to be made today, but would encourage decisions prior to discharge to ensure smooth/easy transition back home pending her decisions.  Discussion on the difference between Palliative and Hospice care. Palliative care and hospice have similar goals of managing symptoms, promoting comfort, improving quality of life, and maintaining a person's dignity. However, palliative care may be offered during any phase of a serious illness, while hospice care is usually offered when a person is expected to live for 6 months or less.  Provided education and counseling at length on the philosophy and benefits of hospice care. Discussed that it offers a holistic approach to care  in the setting of end-stage illness, and is about supporting the patient where they are allowing nature to take it's course. Discussed the hospice team includes RNs, physicians, social workers, and chaplains. They can provide personal care, support for the family, and help keep patient out of the hospital as well as assist with DME needs for home hospice. Education provided on the difference between home vs residential hospice.  Patient is clear that she would not want to go to hospice facility; her goal is to remain at home.   Visit also consisted of discussions dealing with the complex and emotionally intense issues of symptom management and palliative care in the setting of serious and  potentially life-threatening illness.  Patient took a dose of Dilaudid yesterday - she tells me "I felt the best that I felt in a long time."  She is worried that her other medications (hydrocodone) will be discontinued with hospice - reviewed all medications would be continued if considered focused on quality/comfort, including her hydrocodone and Xanax.  Patient expresses relief to hear this.  Discussed Dilaudid in terms of goals at discharge - she understands that it could be continued with hospice.  Reviewed in detail disposition options in context of her goal to return home: home with [facility] +/- outpatient Palliative Care vs home with hospice vs home without support of either.  Therapeutic listening provided as patient tells describes her exercise routine that she completes 3 times per week -she wonders if hospice would allow her to continue this -reviewed that hospice would want her to be as functional and independent for as long as she can and would encourage exercise; however, she would not have licensed PT/OT visiting her home for therapy services.  She was relieved to hear this.  Patient is able to verbalize the expected trajectory of COPD; she tells me "[facility] does not except this."  Encouraged ongoing discussions.    Patient continues to verbalize that her goal is to be able to feel well enough to go fishing.   Thank the patient for allowing discussion today.  She expresses appreciation for information provided today.  She request time to process information and request next PMT follow-up on Monday.  Discussed with patient/family the importance of continued conversation with each other and the medical providers regarding overall plan of care and treatment options, ensuring decisions are within the context of the patient's values and GOCs.    All questions and concerns addressed. Encouraged to call with questions and/or concerns. PMT card previously provided.  Length of Stay: 2  Current Medications: Scheduled Meds:   ALPRAZolam  0.5 mg Oral Q6H   arformoterol  15 mcg Nebulization BID   aspirin EC  81 mg Oral Daily   azithromycin  250 mg Oral Daily   bisoprolol  5 mg Oral Daily   budesonide (PULMICORT) nebulizer solution  0.5 mg Nebulization BID   docusate sodium  100 mg Oral BID   enoxaparin (LOVENOX) injection  40 mg Subcutaneous Q24H   ezetimibe  10 mg Oral Daily   [START ON 10/12/2022] furosemide  20 mg Oral Daily   guaiFENesin  1,200 mg Oral Q12H   ipratropium-albuterol  3 mL Nebulization TID   lactose free nutrition  237 mL Oral BID BM   multivitamin with minerals  1 tablet Oral Daily   nystatin  5 mL Oral QID   PARoxetine  40 mg Oral Daily   revefenacin  175 mcg Nebulization Daily   rosuvastatin  20 mg Oral Daily   sodium chloride flush  3 mL Intravenous Q12H   spironolactone  12.5 mg Oral Daily    Continuous Infusions:  ceFEPime (MAXIPIME) IV 2 g (10/11/22 0021)    PRN Meds: acetaminophen **OR** acetaminophen, albuterol, bisacodyl, hydrALAZINE, HYDROcodone-acetaminophen, HYDROmorphone, magnesium oxide, ondansetron **OR** ondansetron (ZOFRAN) IV, polyethylene glycol, polyvinyl alcohol, zolpidem  Physical Exam Vitals and nursing note reviewed.  Constitutional:      General: She  is not in acute distress.    Appearance: She is ill-appearing.  Pulmonary:     Effort: No respiratory distress.     Comments: Mild conversational dyspnea Skin:    General: Skin is warm and dry.  Neurological:  Mental Status: She is alert and oriented to person, place, and time.     Motor: Weakness present.  Psychiatric:        Attention and Perception: Attention normal.        Behavior: Behavior is cooperative.        Cognition and Memory: Cognition and memory normal.             Vital Signs: BP (!) 148/83 (BP Location: Left Arm)   Pulse 81   Temp 97.9 F (36.6 C) (Oral)   Resp (!) 25   Ht 5\' 1"  (1.549 m)   Wt 63.5 kg   SpO2 94%   BMI 26.45 kg/m  SpO2: SpO2: 94 % O2 Device: O2 Device: Nasal Cannula O2 Flow Rate: O2 Flow Rate (L/min): 2 L/min  Intake/output summary:  Intake/Output Summary (Last 24 hours) at 10/11/2022 1056 Last data filed at 10/11/2022 0400 Gross per 24 hour  Intake 200 ml  Output 1900 ml  Net -1700 ml   LBM:   Baseline Weight: Weight: 63.5 kg Most recent weight: Weight: 63.5 kg       Palliative Assessment/Data: PPS 50%      Patient Active Problem List   Diagnosis Date Noted   COPD with acute exacerbation (HCC) 10/09/2022   AKI (acute kidney injury) (HCC) 10/09/2022   COPD exacerbation (HCC) 09/11/2022   Acute on chronic respiratory failure with hypoxia and hypercapnia (HCC) 09/10/2022   Myalgia due to statin 06/05/2022   Medication reaction 02/12/2022   NSVT (nonsustained ventricular tachycardia) (HCC) 10/11/2021   Hyperkalemia 09/22/2021   SOB (shortness of breath) 09/19/2021   Chronic respiratory failure with hypoxia and hypercapnia (HCC)    DNR (do not resuscitate) 09/17/2021   Double vision 08/26/2021   Stage 3a chronic kidney disease (CKD) (HCC) 08/26/2021   Postconcussive syndrome 08/26/2021   Elevated troponin 08/26/2021   Subarachnoid hemorrhage (HCC) 08/26/2021   Syncope and collapse 08/26/2021   NSTEMI (non-ST elevated  myocardial infarction) (HCC) 11/17/2020   Osteoporosis 06/05/2020   Chronic systolic CHF (congestive heart failure) (HCC) 03/24/2020   Essential hypertension 03/24/2020   Vertebral fracture, osteoporotic (HCC) 03/05/2020   History of colonic polyps    Benign neoplasm of ascending colon    Neuritis 01/29/2017   Alpha-1-antitrypsin deficiency carrier 01/19/2017   Dependent edema 01/14/2017   Orthostatic hypotension    Allergic rhinitis 08/10/2015   Hyperglycemia    Coronary artery calcification seen on CAT scan 07/02/2015   Solitary pulmonary nodule 12/02/2013   Loss of weight 11/10/2013   Osteoarthritis 06/05/2009   Brachial neuritis or radiculitis 06/05/2009   Dyslipidemia 03/12/2009   NEPHROLITHIASIS 01/05/2008   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 female with past medical history of COPD on 2L [place] O2 and nocturnal BIPAP, SAH, HTN, CAD, and depression presented to ED on 10/09/22 from home with complaints of increasing shortness of breath. She was recently admitted 6/29-10/07/22 for COPD exacerbation - this is patient's third admission in 6 months for the same and she is a 7 day readmission. Patient was admitted on 10/09/2022 with COPD exacerbation and AKI on CKD 3a.   Assessment: Principal Problem:   COPD with acute exacerbation (HCC) Active Problems:   Dyslipidemia   Chronic systolic CHF (congestive heart failure) (HCC)   Essential hypertension   Stage 3a chronic kidney disease (CKD) (HCC)   DNR (do not  resuscitate)   AKI (acute kidney injury) (HCC)   Concern about end of life  Recommendations/Plan: Continue current plan of care Continue DNR/DNI as previously documented Patient was open to discussion regarding outpatient Palliative Care vs hospice today - questions answered and misconceptions addressed. She request time to process information and  would like PMT to follow up on Monday 7/8 PMT will continue to follow and support holistically   Goals of Care and Additional Recommendations: Limitations on Scope of Treatment: Full Scope Treatment  Code Status:    Code Status Orders  (From admission, onward)           Start     Ordered   10/09/22 1248  Do not attempt resuscitation (DNR)  Continuous       Question Answer Comment  If patient has no pulse and is not breathing Do Not Attempt Resuscitation   If patient has a pulse and/or is breathing: Medical Treatment Goals LIMITED ADDITIONAL INTERVENTIONS: Use medication/IV fluids and cardiac monitoring as indicated; Do not use intubation or mechanical ventilation (DNI), also provide comfort medications.  Transfer to Progressive/Stepdown as indicated, avoid Intensive Care.   Consent: Discussion documented in EHR or advanced directives reviewed      10/09/22 1250           Code Status History     Date Active Date Inactive Code Status Order ID Comments User Context   10/04/2022 1631 10/07/2022 1458 DNR [id number]  Coralie Keens, MD Inpatient   10/04/2022 1513 10/04/2022 1631 Full Code [id number]  Coralie Keens, MD ED   09/10/2022 1332 09/18/2022 2253 DNR [id number]  Synetta Fail, MD ED   09/10/2022 1246 09/10/2022 1332 Full Code [id number]  Synetta Fail, MD ED   09/19/2021 1531 09/24/2021 2040 DNR [id number]  Lucile Shutters, MD ED   09/19/2021 1531 09/19/2021 1531 Full Code [id number]  Lucile Shutters, MD ED   09/17/2021 1009 09/18/2021 2018 DNR [id number]  Jonah Blue, MD ED   08/26/2021 0756 08/29/2021 2137 Full Code [id number]  Clydie Braun, MD ED   06/26/2021 1604 06/30/2021 2150 Full Code [id number]  Clydie Braun, MD Inpatient   11/15/2020 1850 11/20/2020 1815 Full Code [id number]  Lonia Blood, MD ED   03/24/2020 1630 04/03/2020 1721 DNR [id number]  Jae Dire, MD ED   07/11/2019 0348 07/13/2019 1713 Full Code [id number]  Inez Catalina, MD  Inpatient   01/14/2017 1359 01/18/2017 1722 Full Code [id number]  Ozella Rocks, MD ED   12/03/2016 0816 12/05/2016 1443 Full Code [id number]  Marcos Eke, PA-C ED   07/05/2015 1319 07/08/2015 2131 Full Code [id number]  Meredith Pel, NP Inpatient   07/05/2015 0903 07/05/2015 1319 Full Code [id number]  Gwendolyn Fill, NP ED   06/24/2015 1442 06/28/2015 1636 Full Code [id number]  Gwenyth Bender, NP ED      Advance Directive Documentation    Flowsheet Row Most Recent Value  Type of Advance Directive Living will  Pre-existing out of facility DNR order (yellow form or pink MOST form) --  "MOST" Form in Place? --       Prognosis:  Poor long term  Discharge Planning: To Be Determined  Care plan was discussed with primary RN, patient, patient's family  Thank you for allowing the Palliative Medicine Team to assist in the care of this patient.   Total Time 70 minutes Prolonged Time Billed  yes      Greater than 50%  of this time  was spent counseling and coordinating care related to the above assessment and plan.  Haskel Khan, NP  Please contact Palliative Medicine Team phone at [phone] for questions and concerns.   *Portions of this note are a verbal dictation therefore any spelling and/or grammatical errors are due to the "[employer] One" system interpretation.

## 2022-10-11 NOTE — Progress Notes (Addendum)
PROGRESS NOTE        PATIENT DETAILS Name: Kim [name] Age: 67 y.o. Sex: female Date of Birth: 05-13-1955 Admit Date: 10/09/2022 Admitting Physician Jonah Blue, MD ZOX:[id number], Kandee Keen, NP  Brief Summary: Patient is a 67 y.o.  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  Significant events: 7/4>> admit to [facility]  Significant studies: 7/4>> CXR: No PNA  Significant microbiology data: None  Procedures: None  Consults: PCCM Palliative care  Subjective: No major issues overnight-less anxious today.  Spouse at bedside.  Objective: Vitals: Blood pressure (!) 148/83, pulse 81, temperature 97.9 F (36.6 C), temperature source Oral, resp. rate (!) 25, height 5\' 1"  (1.549 m), weight 63.5 kg, SpO2 94 %.   Exam: Gen Exam:Alert awake-not in any distress HEENT:atraumatic, normocephalic Chest: B/L clear to auscultation anteriorly CVS:S1S2 regular Abdomen:soft non tender, non distended Extremities:no edema Neurology: Non focal Skin: no rash  Pertinent Labs/Radiology:    Latest Ref Rng & Units 10/10/2022    4:19 AM 10/09/2022    9:45 AM 10/06/2022    5:52 AM  CBC  WBC 4.0 - 10.5 K/uL 19.1   19.6   Hemoglobin 12.0 - 15.0 g/dL 98.1  19.1  47.8   Hematocrit 36.0 - 46.0 % 35.1  37.0  36.7   Platelets 150 - 400 K/uL 222   236     Lab Results  Component Value Date   NA 137 10/10/2022   K 4.5 10/10/2022   CL 103 10/10/2022   CO2 26 10/10/2022      Assessment/Plan: Acute on chronic hypoxic respiratory failure-secondary to COPD exacerbation with significant anxiety component Improved-significant anxiety component-given end-stage COPD Appreciate PCCM/palliative care input Slow steroids taper-on chronic prednisone at home Reassurance-continue to treat anxiety is much as possible Do not  think patient requires antibiotics-discontinue cefepime-but will continue Zithromax 250 mg for COPD prophylaxis as recommended by PCCM. Continue current bronchodilator regimen  Chronic HFpEF (EF by echo 35-40% on 6/6) Euvolemic Given recurrent hospitalization-aim to keep her as dry as possible-any negative balance Continue Aldactone/low-dose Lasix/bisoprolol She has had hypotension with losartan-hence avoiding ARB/Entresto Was on Farxiga but this was temporarily held-should be able to resume over the next several days.  HTN BP stable Aldactone/Lasix/bisoprolol  AKI on CKD stage IIIb Creatinine improved-AKI mild-likely hemodynamically mediated Restarting diuretics-watch closely  HLD Statin/Zetia  Anxiety/depression Significant anxiety component contributing to her shortness of breath As needed Xanax Schedule Paxil  OSA CPAP nightly and when sleeping/prolonged naps during the daytime  Palliative care DNR in place Multiple recurrent hospitalizations recently Significant anxiety component Thought to have end-stage COPD-has HFrEF Palliative care following-will await for further recommendations-patient is contemplating going home with hospice/palliative care follow-up.  BMI: Estimated body mass index is 26.45 kg/m as calculated from the following:   Height as of this encounter: 5\' 1"  (1.549 m).   Weight as of this encounter: 63.5 kg.   Code status:   Code Status: DNR   DVT Prophylaxis: enoxaparin (LOVENOX) injection 40 mg Start: 10/11/22 1300   Family Communication: Spouse at bedside   Disposition Plan: Status is: Inpatient Remains inpatient appropriate because: Severity of illness   Planned Discharge Destination: Home with palliative care follow-up-likely on 7/8.   Diet: Diet Order  Diet regular Room service appropriate? Yes; Fluid consistency: Thin  Diet effective now                     Antimicrobial agents: Anti-infectives (From  admission, onward)    Start     Dose/Rate Route Frequency Ordered Stop   10/10/22 1115  ceFEPIme (MAXIPIME) 2 g in sodium chloride 0.9 % 100 mL IVPB        2 g 200 mL/hr over 30 Minutes Intravenous Every 12 hours 10/10/22 1104 10/13/22 2359   10/09/22 1815  azithromycin (ZITHROMAX) tablet 250 mg        250 mg Oral Daily 10/09/22 1721     10/09/22 1300  ceFEPIme (MAXIPIME) 2 g in sodium chloride 0.9 % 100 mL IVPB  Status:  Discontinued        2 g 200 mL/hr over 30 Minutes Intravenous Every 24 hours 10/09/22 1250 10/10/22 1104        MEDICATIONS: Scheduled Meds:  ALPRAZolam  0.5 mg Oral Q6H   arformoterol  15 mcg Nebulization BID   aspirin EC  81 mg Oral Daily   azithromycin  250 mg Oral Daily   bisoprolol  5 mg Oral Daily   budesonide (PULMICORT) nebulizer solution  0.5 mg Nebulization BID   docusate sodium  100 mg Oral BID   enoxaparin (LOVENOX) injection  40 mg Subcutaneous Q24H   ezetimibe  10 mg Oral Daily   [START ON 10/12/2022] furosemide  20 mg Oral Daily   guaiFENesin  1,200 mg Oral Q12H   ipratropium-albuterol  3 mL Nebulization TID   lactose free nutrition  237 mL Oral BID BM   multivitamin with minerals  1 tablet Oral Daily   nystatin  5 mL Oral QID   PARoxetine  40 mg Oral Daily   revefenacin  175 mcg Nebulization Daily   rosuvastatin  20 mg Oral Daily   sodium chloride flush  3 mL Intravenous Q12H   spironolactone  12.5 mg Oral Daily   Continuous Infusions:  ceFEPime (MAXIPIME) IV 2 g (10/11/22 0021)   PRN Meds:.acetaminophen **OR** acetaminophen, albuterol, bisacodyl, hydrALAZINE, HYDROcodone-acetaminophen, HYDROmorphone, magnesium oxide, ondansetron **OR** ondansetron (ZOFRAN) IV, polyethylene glycol, polyvinyl alcohol, zolpidem   I have personally reviewed following labs and imaging studies  LABORATORY DATA: CBC: Recent Labs  Lab 10/04/22 1200 10/05/22 0408 10/06/22 0552 10/09/22 0945 10/10/22 0419  WBC 15.3* 11.3* 19.6*  --  19.1*  NEUTROABS  --    --  16.5*  --   --   HGB 14.5 13.0 11.9* 12.6 11.1*  HCT 46.4* 41.6 36.7 37.0 35.1*  MCV 99.4 97.4 95.8  --  101.2*  PLT 225 220 236  --  222     Basic Metabolic Panel: Recent Labs  Lab 10/04/22 1200 10/05/22 0408 10/06/22 0552 10/09/22 0939 10/09/22 0945 10/10/22 0419  NA 140 138 138 141 137 137  K 3.7 3.8 4.0 4.3 4.2 4.5  CL 98 97* 99 104  --  103  CO2 28 29 28 25   --  26  GLUCOSE 161* 222* 138* 319*  --  176*  BUN 27* 27* 28* 32*  --  34*  CREATININE 1.23* 1.27* 1.26* 1.56*  --  1.22*  CALCIUM 10.3 10.6* 9.9 9.0  --  8.8*     GFR: Estimated Creatinine Clearance: 37.7 mL/min (A) (by C-G formula based on SCr of 1.22 mg/dL (H)).  Liver Function Tests: Recent Labs  Lab 10/04/22 1200 10/09/22 1610  AST 23 34  ALT 29 32  ALKPHOS 69 66  BILITOT 0.8 0.5  PROT 6.4* 5.4*  ALBUMIN 3.6 2.9*    No results for input(s): "LIPASE", "AMYLASE" in the last 168 hours. No results for input(s): "AMMONIA" in the last 168 hours.  Coagulation Profile: No results for input(s): "INR", "PROTIME" in the last 168 hours.  Cardiac Enzymes: No results for input(s): "CKTOTAL", "CKMB", "CKMBINDEX", "TROPONINI" in the last 168 hours.  BNP (last 3 results) No results for input(s): "PROBNP" in the last 8760 hours.  Lipid Profile: No results for input(s): "CHOL", "HDL", "LDLCALC", "TRIG", "CHOLHDL", "LDLDIRECT" in the last 72 hours.  Thyroid Function Tests: No results for input(s): "TSH", "T4TOTAL", "FREET4", "T3FREE", "THYROIDAB" in the last 72 hours.  Anemia Panel: No results for input(s): "VITAMINB12", "FOLATE", "FERRITIN", "TIBC", "IRON", "RETICCTPCT" in the last 72 hours.  Urine analysis:    Component Value Date/Time   COLORURINE YELLOW 08/26/2021 0805   APPEARANCEUR CLEAR 08/26/2021 0805   LABSPEC 1.021 08/26/2021 0805   PHURINE 5.0 08/26/2021 0805   GLUCOSEU NEGATIVE 08/26/2021 0805   HGBUR NEGATIVE 08/26/2021 0805   HGBUR trace-lysed 02/26/2010 0951   BILIRUBINUR  NEGATIVE 08/26/2021 0805   BILIRUBINUR negative 04/29/2021 1243   KETONESUR NEGATIVE 08/26/2021 0805   PROTEINUR NEGATIVE 08/26/2021 0805   UROBILINOGEN 0.2 04/29/2021 1243   UROBILINOGEN 0.2 02/26/2010 0951   NITRITE NEGATIVE 08/26/2021 0805   LEUKOCYTESUR NEGATIVE 08/26/2021 0805    Sepsis Labs: Lactic Acid, Venous    Component Value Date/Time   LATICACIDVEN 2.1 (HH) 04/03/2020 0538    MICROBIOLOGY: Recent Results (from the past 240 hour(s))  SARS Coronavirus 2 by RT PCR (hospital order, performed in [facility] hospital lab) *cepheid single result test* Anterior Nasal Swab     Status: None   Collection Time: 10/04/22 12:00 PM   Specimen: Anterior Nasal Swab  Result Value Ref Range Status   SARS Coronavirus 2 by RT PCR NEGATIVE NEGATIVE Final    Comment: Performed at [facility], [address], [place], [place] [address]    RADIOLOGY STUDIES/RESULTS: No results found.   LOS: 2 days   Jeoffrey Massed, MD  [facility]    To contact the attending provider between 7A-7P or the covering provider during after hours 7P-7A, please log into the web site [email or web address] and access using universal [place] password for that web site. If you do not have the password, please call the hospital operator.  10/11/2022, 11:07 AM

## 2022-10-12 DIAGNOSIS — J441 Chronic obstructive pulmonary disease with (acute) exacerbation: Secondary | ICD-10-CM | POA: Diagnosis not present

## 2022-10-12 NOTE — Hospital Course (Addendum)
Kim [name] is a 67 y.o. 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   Significant events: 7/4>> admit to [facility]   Significant studies: 7/4>> CXR: No PNA   Significant microbiology data: None   Procedures: None   Consults: PCCM Palliative care

## 2022-10-12 NOTE — Progress Notes (Signed)
  Progress Note   Patient: Kim [name] ZOX:[id number] DOB: 1955-05-11 DOA: 10/09/2022     3 DOS: the patient was seen and examined on 10/12/2022 at 8:33 AM      Brief hospital course: Kim [name] is a 67 y.o. 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   Significant events: 7/4>> admit to [facility]   Significant studies: 7/4>> CXR: No PNA   Significant microbiology data: None   Procedures: None   Consults: PCCM Palliative care     Assessment and Plan: COPD exacerbation End-stage COPD Chronic respiratory failure Patient presented with dyspnea.  Acute respiratory failure ruled out.  Suspect anxiety is a component of her symptoms. On chronic prednisone at home  Pulmonology and palliative care consulted - Continue antibiotics - Continue bronchodilators - Continue prednisone  - Continue Pulmicort, Roxy Manns  - Also started on oral Dilaudid this admission for dyspnea - Home Xanax also titrated up in frequency  - Plan to discharge on prednisone taper, with changing Norco to PO hydromorphone or morphine for dyspnea control - Plan for discharge with [facility] and outpatient Palliative, vs Hospice (patient undecided at this time)    Chronic systolic and diastolic congestive heart failure Hypertension Appears euvolemic, blood pressure controlled - Continue furosemide, spironolactone, bisoprolol  Acute kidney injury on CKD stage IIIb Creatinine up to 1.56 on admission, improved to baseline 1.2 today  Hyperlipidemia -Continue Zetia and Crestor  Anxiety -Continue Xanax, Paxil  Sleep apnea - Continue CPAP at night          Subjective: Patient's respiratory symptoms are stable, her husband is at the bedside, she still appears dyspneic at rest, quite anxious.  No new fever, no change in sputum.     Physical Exam: BP (!) 158/76 (BP Location: Left Arm)   Pulse  88   Temp (!) 97.5 F (36.4 C) (Oral)   Resp 18   Ht 5\' 1"  (1.549 m)   Wt 66.2 kg   SpO2 100%   BMI 27.58 kg/m   Frail elderly female, lying in bed, interactive RRR, no murmurs, no peripheral edema Respiratory rate seems increased, she is splinting, she is doing pursed lip breathing, overall air movement actually sounds good, I do not appreciate rales or wheezes Abdomen soft no tenderness palpation or guarding, no ascites or distention Attention normal, affect anxious, judgment insight appear normal, strength in the upper extremities is symmetric  Data Reviewed: No new labs  Family Communication: Husband at the bedside    Disposition: Status is: Inpatient The patient was admitted for COPD exacerbation in setting of end-stage COPD  She has been started on opiate dyspnea control medication, which should be continued at discharg.e  She will need palliative care versus hospice at the time of discharge, likely tomorrow        Author: Alberteen Sam, MD 10/12/2022 11:49 AM  For on call review [email or web address].

## 2022-10-12 NOTE — Progress Notes (Signed)
Patient and patient husband will place self on CPAP no help will be needed.

## 2022-10-13 ENCOUNTER — Ambulatory Visit: Payer: Medicare Other | Admitting: Nurse Practitioner

## 2022-10-13 DIAGNOSIS — Z66 Do not resuscitate: Secondary | ICD-10-CM | POA: Diagnosis not present

## 2022-10-13 DIAGNOSIS — N1831 Chronic kidney disease, stage 3a: Secondary | ICD-10-CM | POA: Diagnosis not present

## 2022-10-13 DIAGNOSIS — Z7189 Other specified counseling: Secondary | ICD-10-CM | POA: Diagnosis not present

## 2022-10-13 DIAGNOSIS — J441 Chronic obstructive pulmonary disease with (acute) exacerbation: Secondary | ICD-10-CM | POA: Diagnosis not present

## 2022-10-13 LAB — CBC
HCT: 40.6 % (ref 36.0–46.0)
Hemoglobin: 12.7 g/dL (ref 12.0–15.0)
MCH: 30.9 pg (ref 26.0–34.0)
MCHC: 31.3 g/dL (ref 30.0–36.0)
MCV: 98.8 fL (ref 80.0–100.0)
Platelets: 288 10*3/uL (ref 150–400)
RBC: 4.11 MIL/uL (ref 3.87–5.11)
RDW: 13.7 % (ref 11.5–15.5)
WBC: 16.8 10*3/uL — ABNORMAL HIGH (ref 4.0–10.5)
nRBC: 0 % (ref 0.0–0.2)

## 2022-10-13 LAB — BASIC METABOLIC PANEL
Anion gap: 10 (ref 5–15)
BUN: 23 mg/dL (ref 8–23)
CO2: 31 mmol/L (ref 22–32)
Calcium: 10.1 mg/dL (ref 8.9–10.3)
Chloride: 98 mmol/L (ref 98–111)
Creatinine, Ser: 0.97 mg/dL (ref 0.44–1.00)
GFR, Estimated: >60 mL/min (ref >60)
Glucose, Bld: 93 mg/dL (ref 70–99)
Potassium: 4.9 mmol/L (ref 3.5–5.1)
Sodium: 139 mmol/L (ref 135–145)

## 2022-10-13 LAB — BRAIN NATRIURETIC PEPTIDE: B Natriuretic Peptide: 217.8 pg/mL — ABNORMAL HIGH (ref 0.0–100.0)

## 2022-10-13 LAB — MAGNESIUM: Magnesium: 2.3 mg/dL (ref 1.7–2.4)

## 2022-10-13 MED ORDER — IPRATROPIUM-ALBUTEROL 0.5-2.5 (3) MG/3ML IN SOLN: 3.0000 mL | RESPIRATORY_TRACT | Status: DC | PRN

## 2022-10-13 MED ORDER — TRAZODONE HCL 50 MG PO TABS: 50.0000 mg | ORAL_TABLET | Freq: Every evening | ORAL | Status: DC | PRN

## 2022-10-13 MED ORDER — HYDRALAZINE HCL 20 MG/ML IJ SOLN: 10.0000 mg | INTRAMUSCULAR | Status: DC | PRN

## 2022-10-13 MED ORDER — ALUM & MAG HYDROXIDE-SIMETH 200-200-20 MG/5ML PO SUSP: 30.0000 mL | ORAL | Status: DC | PRN

## 2022-10-13 MED ORDER — FUROSEMIDE 40 MG PO TABS
40.0000 mg | ORAL_TABLET | Freq: Once | ORAL | Status: AC
  Administered 2022-10-13: 40 mg via ORAL
  Filled 2022-10-13: qty 1

## 2022-10-13 MED ORDER — GUAIFENESIN 100 MG/5ML PO LIQD: 5.0000 mL | ORAL | Status: DC | PRN

## 2022-10-13 MED ORDER — METOPROLOL TARTRATE 5 MG/5ML IV SOLN: 5.0000 mg | INTRAVENOUS | Status: DC | PRN

## 2022-10-13 NOTE — Progress Notes (Signed)
Occupational Therapy Treatment Patient Details Name: Kim [name] MRN: [id number] DOB: [birth year] Today's Date: 10/13/2022   History of present illness 67 y/o F admitted to [facility] on 7/4 for worsening shortness of breath. Pt with recent admission 6/29 - 7/2 and 6/5 - 6/13 for COPD exacerbations. PMHx: COPD on home O2, HFrEF, numerous hospitalizations for COPD exacerbation, CAD, HTN, anxiety/depression, SAH   OT comments  Pt completed bed mobility with supervision to exit bed but then Min assist when returning to bed due to fatigue.  Pt O2 sats continue to wane with mobility, pt benefiting from bump from 2L at rest to 3L with mobility with sats primarily remaining in low 90s with intermittent assessment.  Pt limited in participation this session due to still feeling SOB with mobility and feeling anxious with movements.  Pt could complete all tasks with supervision/setup and min guard with stand pivot transfer, however pt's spouse assisting with wiping for thoroughness post toileting.  OT reviewed 4 P's of energy conservation and provided with energy conservation handouts with reiterating sitting for basic tasks to conserve energy for other tasks requiring increased energy expenditure.   Recommendations for follow up therapy are one component of a multi-disciplinary discharge planning process, led by the attending physician.  Recommendations may be updated based on patient status, additional functional criteria and insurance authorization.    Assistance Recommended at Discharge Frequent or constant Supervision/Assistance  Patient can return home with the following  A little help with walking and/or transfers;A lot of help with bathing/dressing/bathroom;Assistance with cooking/housework;Direct supervision/assist for medications management;Direct supervision/assist for financial management;Assist for transportation;Help with stairs or ramp for entrance   Equipment Recommendations  Tub/shower seat        Precautions / Restrictions Precautions Precautions: Fall;Other (comment) Precaution Comments: watch O2 Restrictions Weight Bearing Restrictions: No       Mobility Bed Mobility Overal bed mobility: Needs Assistance Bed Mobility: Supine to Sit, Sit to Supine     Supine to sit: Supervision, HOB elevated Sit to supine: Supervision, HOB elevated   General bed mobility comments: supervision for safety and line management    Transfers Overall transfer level: Needs assistance Equipment used: 1 person hand held assist Transfers: Sit to/from Stand, Bed to chair/wheelchair/BSC Sit to Stand: Min guard Stand pivot transfers: Min guard         General transfer comment: min guard throughout sit > stand and stand pivot transfer to/from [facility].  Pt steadying self on arm rests and/or EOB when completing hygiene or when husband assisting with thoroughness     Balance Overall balance assessment: Needs assistance Sitting-balance support: Feet supported Sitting balance-Leahy Scale: Good     Standing balance support: Single extremity supported, During functional activity Standing balance-Leahy Scale: Fair Standing balance comment: reliant on UE support for standing and pivoting                           ADL either performed or assessed with clinical judgement   ADL Overall ADL's : Needs assistance/impaired Eating/Feeding: Independent;Sitting   Grooming: Independent;Sitting   Upper Body Bathing: Minimal assistance;Sitting   Lower Body Bathing: Moderate assistance;Sit to/from stand Lower Body Bathing Details (indicate cue type and reason): husband assisting with thoroughness after pt attempting Upper Body Dressing : Min guard;Sitting (assistance for line management)   Lower Body Dressing: Sit to/from stand;Minimal assistance   Toilet Transfer: Stand-pivot;BSC/3in1;Min guard (hand held assist +1) Toilet Transfer Details (indicate cue type and reason): OT assisting in  managing lines during stand pivot transfer to [facility] Toileting- Clothing Manipulation and Hygiene: Minimal assistance;Sit to/from stand Toileting - Clothing Manipulation Details (indicate cue type and reason): Min assist for hygeine and CGA for standing     Functional mobility during ADLs: Min guard General ADL Comments: Pt fatigues quickly during ADLs, requiring frequent rest breaks.    Extremity/Trunk Assessment Upper Extremity Assessment Upper Extremity Assessment: Generalized weakness       Cervical / Trunk Assessment Cervical / Trunk Assessment: Normal              General Comments Pt on 2L continue O2 via [place] upon arrival. Pt requesting to be bumped up to 3L prior to transfer from EOB to [facility].  Pt maintaining O2 sats 93-94 on 3L during stand pivot transfer.  Pt does report fatigue and SOB, benefiting from frequent rest breaks throughout hygiene and BSC/bed transfers.  Pt's husband present throughout and assisting with thoroughness post BM and set up to ensure positioning upon return to bed after toileting.    Pertinent Vitals/ Pain       Pain Assessment Pain Assessment: No/denies pain  Home Living Family/patient expects to be discharged to:: Private residence Living Arrangements: Spouse/significant other Available Help at Discharge: Family;Available 24 hours/day Type of Home: Apartment Home Access: Stairs to enter Entrance Stairs-Number of Steps: 14 Entrance Stairs-Rails: Left Home Layout: One level     Bathroom Shower/Tub: Chief Strategy Officer: Standard     Home Equipment: Agricultural consultant (2 wheels);Rollator (4 wheels);Transport chair (neighbor has transport chair that pt utilizes)   Additional Comments: 2-3L O2 at baseline          Frequency  Min 2X/week        Progress Toward Goals  OT Goals(current goals can now be found in the care plan section)  Progress towards OT goals: Progressing toward goals  Acute Rehab OT Goals Patient Stated  Goal: To return home with family OT Goal Formulation: With patient Time For Goal Achievement: 10/24/22 Potential to Achieve Goals: Good  Plan Discharge plan remains appropriate       AM-PAC OT "6 Clicks" Daily Activity     Outcome Measure   Help from another person eating meals?: None Help from another person taking care of personal grooming?: None Help from another person toileting, which includes using toliet, bedpan, or urinal?: A Little Help from another person bathing (including washing, rinsing, drying)?: A Lot Help from another person to put on and taking off regular upper body clothing?: A Little Help from another person to put on and taking off regular lower body clothing?: A Lot 6 Click Score: 18    End of Session Equipment Utilized During Treatment: Oxygen;Other (comment) (BSC)  OT Visit Diagnosis: Unsteadiness on feet (R26.81);History of falling (Z91.81);Muscle weakness (generalized) (M62.81)   Activity Tolerance Patient limited by fatigue   Patient Left in bed;with call bell/phone within reach;with bed alarm set   Nurse Communication Mobility status;Other (comment) (Pt had BM, RN in during session to provide Xanax)        Time: 1610-9604 OT Time Calculation (min): 35 min  Charges: OT General Charges $OT Visit: 1 Visit OT Treatments $Self Care/Home Management : 23-37 mins    Olisa Quesnel Acute office: [phone] 10/13/2022, 11:24 AM

## 2022-10-13 NOTE — Progress Notes (Signed)
Daily Progress Note   Patient Name: Kim [name]       Date: 10/13/2022 DOB: Dec 06, 1955  Age: 67 y.o. MRN#: [id number] Attending Physician: Dimple Nanas, MD Primary Care Physician: Shirline Frees, NP Admit Date: 10/09/2022  Reason for Consultation/Follow-up: Establishing goals of care  Subjective: I have reviewed medical records including EPIC notes, MAR, and labs. Patient assessed at the bedside. She reports breathing is very good today, though a different story when she gets up to go to the bathroom. [name] is present visiting.   Created space and opportunity for patient's thoughts and feelings on her current illness. Patient has decided to proceed with outpatient palliative care follow up and consider hospice in the future if necessary. Provided further clarification on the role of outpatient palliative care, insurance coverage, and medications coverage likely as typical for other medications already on board. Provided reassurance that I would seek further clarification from TOC.  Emotional support and therapeutic listening was provided as patient shared her experience recently discussing her prognosis with the care team. This has come as a surprise to her even though she asked to discuss this information. She shares about her experience with losing her MIL last year and her desire to remain positive while acknowledging the importance of "getting my ducks in a row."  All questions and concerns addressed. Encouraged to call with questions and/or concerns. PMT card provided.  Length of Stay: 4  Physical Exam Vitals and nursing note reviewed.  Constitutional:      General: She is not in acute distress.    Appearance: She is ill-appearing.  Cardiovascular:     Rate and  Rhythm: Normal rate.  Pulmonary:     Effort: No respiratory distress.  Skin:    General: Skin is warm and dry.  Neurological:     Mental Status: She is alert and oriented to person, place, and time.  Psychiatric:        Attention and Perception: Attention normal.        Behavior: Behavior is cooperative.        Cognition and Memory: Cognition and memory normal.            Vital Signs: BP 120/67 (BP Location: Left Arm)   Pulse 66   Temp 98.8 F (37.1 C) (Axillary)   Resp  18   Ht 5\' 1"  (1.549 m)   Wt 64.5 kg   SpO2 97%   BMI 26.87 kg/m  SpO2: SpO2: 97 % O2 Device: O2 Device: Nasal Cannula O2 Flow Rate: O2 Flow Rate (L/min): 2 L/min   Palliative Assessment/Data: PPS 50%    Palliative Care Assessment & Plan   Patient Profile: 67 y.o. female with past medical history of COPD on 2L [place] O2 and nocturnal BIPAP, SAH, HTN, CAD, and depression presented to ED on 10/09/22 from home with complaints of increasing shortness of breath. She was recently admitted 6/29-10/07/22 for COPD exacerbation - this is patient's third admission in 6 months for the same and she is a 7 day readmission. Patient was admitted on 10/09/2022 with COPD exacerbation and AKI on CKD 3a.   Assessment: Principal Problem:   COPD with acute exacerbation (HCC) Active Problems:   Dyslipidemia   Chronic systolic CHF (congestive heart failure) (HCC)   Essential hypertension   Stage 3a chronic kidney disease (CKD) (HCC)   DNR (do not resuscitate)   AKI (acute kidney injury) (HCC)   Concern about end of life  Recommendations/Plan: Continue current plan of care Continue DNR/DNI  Patient has decided for outpatient palliative care follow up at home, [facility] consulted for referral Psychosocial and emotional support provided PMT will continue to follow and support holistically   Prognosis:  Poor long term  Discharge Planning: Home with Palliative Services  Care plan was discussed with patient, MD  Total time: I  spent 65 minutes in the care of the patient today in the above activities and documenting the encounter.    Richardson Dopp, PA-C Palliative Medicine Team Team phone # [phone]  Thank you for allowing the Palliative Medicine Team to assist in the care of this patient. Please utilize secure chat with additional questions, if there is no response within 30 minutes please call the above phone number.  Palliative Medicine Team providers are available by phone from 7am to 7pm daily and can be reached through the team cell phone.  Should this patient require assistance outside of these hours, please call the patient's attending physician.  Portions of this note are a verbal dictation therefore any spelling and/or grammatical errors are due to the "[employer] One" system interpretation.

## 2022-10-13 NOTE — TOC Progression Note (Signed)
Transition of Care [facility]) - Progression Note    Patient Details  Name: Kim [name] MRN: [id number] Date of Birth: [birth year]  Transition of Care [facility]) CM/SW Contact  Gordy Clement, RN Phone Number: 10/13/2022, 1:16 PM  Clinical Narrative:     Patient is being recommended Palliative at home and Home Health PT/ OT Matilde Sprang . [facility] will provide the SN, PT and OT. [facility] will be providing the palliative services. A shower chair/ tub bench was recommended but patient is declining because insurance will not cover. She will acquire on her own. Husband will be transportation home.    TOC will continue to follow patient for any additional discharge needs              Expected Discharge Plan and Services                                               Social Determinants of Health (SDOH) Interventions SDOH Screenings   Food Insecurity: No Food Insecurity (10/09/2022)  Housing: Patient Declined (10/09/2022)  Transportation Needs: No Transportation Needs (10/09/2022)  Utilities: Not At Risk (10/09/2022)  Alcohol Screen: Low Risk  (03/22/2020)  Depression (PHQ2-9): Medium Risk (09/10/2021)  Financial Resource Strain: High Risk (09/10/2021)  Physical Activity: Inactive (03/25/2021)  Social Connections: Moderately Isolated (03/25/2021)  Stress: No Stress Concern Present (09/10/2021)  Tobacco Use: Medium Risk (10/09/2022)    Readmission Risk Interventions    09/20/2021    3:27 PM  Readmission Risk Prevention Plan  Transportation Screening Complete  Medication Review (RN Care Manager) Complete  PCP or Specialist appointment within 3-5 days of discharge Complete  HRI or Home Care Consult Complete  SW Recovery Care/Counseling Consult Complete  Palliative Care Screening Not Applicable  Skilled Nursing Facility Not Applicable

## 2022-10-13 NOTE — Progress Notes (Signed)
Physical Therapy Treatment Patient Details Name: Kim [name] MRN: [id number] DOB: 03-15-1956 Today's Date: 10/13/2022   History of Present Illness 67 y/o F admitted to [facility] on 7/4 for worsening shortness of breath. Pt with recent admission 6/29 - 7/2 and 6/5 - 6/13 for COPD exacerbations. PMHx: COPD on home O2, HFrEF, numerous hospitalizations for COPD exacerbation, CAD, HTN, anxiety/depression, SAH    PT Comments  Pt greeted resting in bed and agreeable to session, despite increased anxiety regarding mobility and increased SOB with activity. Pt becoming tearful during session about prognosis, offered therapeutic listening with pt agreeable to mobility with encouragement. Pt requiring grossly min guard assist for bed mobility and functional transfers with up to [facility] for support. Pt with good recall and able to demonstrate all LE exercises pt does at home on a regular basis, encouraged pt to continue post acutely to maintain strength and endurance. Current plan remains appropriate to address deficits and maximize functional independence and decrease caregiver burden. Pt continues to benefit from skilled PT services to progress toward functional mobility goals.      Assistance Recommended at Discharge Frequent or constant Supervision/Assistance  If plan is discharge home, recommend the following:  Can travel by private vehicle    A little help with walking and/or transfers;A little help with bathing/dressing/bathroom;Assistance with cooking/housework;Assist for transportation;Help with stairs or ramp for entrance      Equipment Recommendations  None recommended by PT    Recommendations for Other Services Other (comment) (Chaplain consult)     Precautions / Restrictions Precautions Precautions: Fall;Other (comment) Precaution Comments: watch O2 Restrictions Weight Bearing Restrictions: No     Mobility  Bed Mobility Overal bed mobility: Needs Assistance Bed Mobility: Supine to Sit,  Sit to Supine     Supine to sit: Supervision, HOB elevated     General bed mobility comments: supervision for safety and line management    Transfers Overall transfer level: Needs assistance Equipment used: 1 person hand held assist Transfers: Sit to/from Stand Sit to Stand: Min guard, Min assist           General transfer comment: min A to steady down to min gaurd, min a to step away from EOB    Ambulation/Gait               General Gait Details: unable to attempt today due to pt feeling SOB   Stairs             Wheelchair Mobility     Tilt Bed    Modified Rankin (Stroke Patients Only)       Balance Overall balance assessment: Needs assistance Sitting-balance support: Feet supported Sitting balance-Leahy Scale: Good     Standing balance support: Single extremity supported, During functional activity Standing balance-Leahy Scale: Fair Standing balance comment: reliant on UE support for standing and pivoting                            Cognition Arousal/Alertness: Awake/alert Behavior During Therapy: Anxious (tearful at start about general health) Overall Cognitive Status: Within Functional Limits for tasks assessed                                 General Comments: Pt pleasant during session but becomes anxious with mobility. Pt likes to have her fan nearby. Enjoys joking but becomes emotional when thinking about her prognosis.  Exercises      General Comments General comments (skin integrity, edema, etc.): VSS on 2L O2, pt SOB with activity and axiety with mobility, premedicated and encouraged breathing techniques      Pertinent Vitals/Pain Pain Assessment Pain Assessment: No/denies pain    Home Living                          Prior Function            PT Goals (current goals can now be found in the care plan section) Acute Rehab PT Goals Patient Stated Goal: go home PT Goal  Formulation: With patient Time For Goal Achievement: 10/24/22 Progress towards PT goals: Progressing toward goals    Frequency    Min 2X/week      PT Plan Current plan remains appropriate    Co-evaluation              AM-PAC PT "6 Clicks" Mobility   Outcome Measure  Help needed turning from your back to your side while in a flat bed without using bedrails?: A Little Help needed moving from lying on your back to sitting on the side of a flat bed without using bedrails?: A Little Help needed moving to and from a bed to a chair (including a wheelchair)?: A Little Help needed standing up from a chair using your arms (e.g., wheelchair or bedside chair)?: A Little Help needed to walk in hospital room?: A Little Help needed climbing 3-5 steps with a railing? : A Lot 6 Click Score: 17    End of Session Equipment Utilized During Treatment: Oxygen Activity Tolerance: Patient limited by fatigue Patient left: in bed;with call bell/phone within reach;with family/visitor present (seated EOB) Nurse Communication: Mobility status;Other (comment) (chaplain consult) PT Visit Diagnosis: Unsteadiness on feet (R26.81);Other abnormalities of gait and mobility (R26.89);Difficulty in walking, not elsewhere classified (R26.2);History of falling (Z91.81);Muscle weakness (generalized) (M62.81)     Time: 9604-5409 PT Time Calculation (min) (ACUTE ONLY): 24 min  Charges:    $Therapeutic Activity: 23-37 mins PT General Charges $$ ACUTE PT VISIT: 1 Visit                     Dailen Mcclish R. PTA Acute Rehabilitation Services Office: [phone]   Catalina Antigua 10/13/2022, 4:56 PM

## 2022-10-13 NOTE — Progress Notes (Signed)
  Patient and patient husband will place self on CPAP no help will be needed. Will call if assistance is needed

## 2022-10-13 NOTE — Care Management Important Message (Signed)
Important Message  Patient Details  Name: Kim [name] MRN: [id number] Date of Birth: 1955-12-09   Medicare Important Message Given:  Yes     Dorena Bodo 10/13/2022, 3:46 PM

## 2022-10-13 NOTE — Progress Notes (Signed)
[facility] ([facility]) Hospital Liaison Note  Notified by TOC manager of patient/family request for [facility] palliative services at home after discharge.   [facility] hospital liaison will follow patient for discharge disposition.   Please call with any hospice or outpatient palliative care related questions.   Thank you for the opportunity to participate in this patient's care.   Shanita Wicker, LCSW [facility] Hospital Liaison [phone]  

## 2022-10-13 NOTE — Progress Notes (Signed)
PROGRESS NOTE    Kim [name]  GNF:[id number] DOB: 1955/04/16 DOA: 10/09/2022 PCP: Shirline Frees, NP   Brief Narrative:   67 y.o. 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   Assessment & Plan:  Principal Problem:   COPD with acute exacerbation (HCC) Active Problems:   Chronic systolic CHF (congestive heart failure) (HCC)   Essential hypertension   Stage 3a chronic kidney disease (CKD) (HCC)   Dyslipidemia   DNR (do not resuscitate)   AKI (acute kidney injury) (HCC)    COPD exacerbation End-stage COPD Chronic respiratory failure Recurrent severe symptoms of end-stage COPD with multiple admissions.  Chronically on steroids at home. Still has quite a bit of exertional SOB -Continue azithromycin, total 5 days - P.o. prednisone, this she will require prolonged taper down to her home regimen - Continue bronchodilators scheduled and as needed.  I-S/flutter valve - Seen by palliative care service-meeting later today    Acute on chronic congestive heart failure with reduced ejection fraction, 35%.  Grade 1 DD.  Stage III -Has elevated BNP.  Echocardiogram from June 2024 shows EF of 35%, grade 1 DD.  Currently on Lasix 20 mg daily, Aldactone, bisoprolol, aspirin.  Will put on fluid restriction   Acute kidney injury on CKD stage IIIb Admission creatinine 1.56, now down to baseline of 1.2   Hyperlipidemia -Continue Zetia and Crestor   Anxiety -Continue Xanax, Paxil   Sleep apnea - Continue CPAP at night  PT/OT = HH; face to face completed.   DVT prophylaxis: Lovenox Code Status: DNR Family Communication:   Status is: Inpatient On going treatment for COPD. Hopefully home tomorrow.        Diet Orders (From admission, onward)     Start     Ordered   10/09/22 1248  Diet regular Room service appropriate? Yes; Fluid consistency: Thin  Diet effective now       Question  Answer Comment  Room service appropriate? Yes   Fluid consistency: Thin      10/09/22 1250            Subjective: Still has exertional SOB. Very anxious about being SOB even after going home.    Examination:  General exam: Appears calm and comfortable  Respiratory system: diminished BS b/l  Cardiovascular system: S1 & S2 heard, RRR. No JVD, murmurs, rubs, gallops or clicks. No pedal edema. Gastrointestinal system: Abdomen is nondistended, soft and nontender. No organomegaly or masses felt. Normal bowel sounds heard. Central nervous system: Alert and oriented. No focal neurological deficits. Extremities: Symmetric 5 x 5 power. Skin: No rashes, lesions or ulcers Psychiatry: Judgement and insight appear normal. Mood & affect appropriate.  Objective: Vitals:   10/12/22 2014 10/13/22 0019 10/13/22 0420 10/13/22 0743  BP:  114/69 120/67   Pulse:  71 66   Resp:  20 18   Temp:  98.8 F (37.1 C) 98.8 F (37.1 C)   TempSrc:  Axillary Axillary   SpO2: 97% 96% 100% 96%  Weight:   64.5 kg   Height:        Intake/Output Summary (Last 24 hours) at 10/13/2022 0757 Last data filed at 10/13/2022 0423 Gross per 24 hour  Intake 723 ml  Output 1350 ml  Net -627 ml   Filed Weights   10/09/22 0935 10/12/22 0449 10/13/22 0420  Weight: 63.5 kg 66.2 kg 64.5 kg    Scheduled  Meds:  ALPRAZolam  0.5 mg Oral Q6H   arformoterol  15 mcg Nebulization BID   aspirin EC  81 mg Oral Daily   azithromycin  250 mg Oral Daily   bisoprolol  5 mg Oral Daily   budesonide (PULMICORT) nebulizer solution  0.5 mg Nebulization BID   docusate sodium  100 mg Oral BID   enoxaparin (LOVENOX) injection  40 mg Subcutaneous Q24H   ezetimibe  10 mg Oral Daily   furosemide  20 mg Oral Daily   guaiFENesin  1,200 mg Oral Q12H   ipratropium-albuterol  3 mL Nebulization TID   lactose free nutrition  237 mL Oral BID BM   multivitamin with minerals  1 tablet Oral Daily   nystatin  5 mL Oral QID   PARoxetine  40 mg  Oral Daily   predniSONE  40 mg Oral Q breakfast   revefenacin  175 mcg Nebulization Daily   rosuvastatin  20 mg Oral Daily   sodium chloride flush  3 mL Intravenous Q12H   spironolactone  12.5 mg Oral Daily   Continuous Infusions:  Nutritional status Signs/Symptoms: estimated needs Interventions: MVI, Boost Plus, Education, Refer to RD note for recommendations Body mass index is 26.87 kg/m.  Data Reviewed:   CBC: Recent Labs  Lab 10/09/22 0945 10/10/22 0419  WBC  --  19.1*  HGB 12.6 11.1*  HCT 37.0 35.1*  MCV  --  101.2*  PLT  --  222   Basic Metabolic Panel: Recent Labs  Lab 10/09/22 0939 10/09/22 0945 10/10/22 0419  NA 141 137 137  K 4.3 4.2 4.5  CL 104  --  103  CO2 25  --  26  GLUCOSE 319*  --  176*  BUN 32*  --  34*  CREATININE 1.56*  --  1.22*  CALCIUM 9.0  --  8.8*   GFR: Estimated Creatinine Clearance: 38 mL/min (A) (by C-G formula based on SCr of 1.22 mg/dL (H)). Liver Function Tests: Recent Labs  Lab 10/09/22 0939  AST 34  ALT 32  ALKPHOS 66  BILITOT 0.5  PROT 5.4*  ALBUMIN 2.9*   No results for input(s): "LIPASE", "AMYLASE" in the last 168 hours. No results for input(s): "AMMONIA" in the last 168 hours. Coagulation Profile: No results for input(s): "INR", "PROTIME" in the last 168 hours. Cardiac Enzymes: No results for input(s): "CKTOTAL", "CKMB", "CKMBINDEX", "TROPONINI" in the last 168 hours. BNP (last 3 results) No results for input(s): "PROBNP" in the last 8760 hours. HbA1C: No results for input(s): "HGBA1C" in the last 72 hours. CBG: No results for input(s): "GLUCAP" in the last 168 hours. Lipid Profile: No results for input(s): "CHOL", "HDL", "LDLCALC", "TRIG", "CHOLHDL", "LDLDIRECT" in the last 72 hours. Thyroid Function Tests: No results for input(s): "TSH", "T4TOTAL", "FREET4", "T3FREE", "THYROIDAB" in the last 72 hours. Anemia Panel: No results for input(s): "VITAMINB12", "FOLATE", "FERRITIN", "TIBC", "IRON", "RETICCTPCT"  in the last 72 hours. Sepsis Labs: Recent Labs  Lab 10/09/22 1241  PROCALCITON 0.43    Recent Results (from the past 240 hour(s))  SARS Coronavirus 2 by RT PCR (hospital order, performed in [facility] hospital lab) *cepheid single result test* Anterior Nasal Swab     Status: None   Collection Time: 10/04/22 12:00 PM   Specimen: Anterior Nasal Swab  Result Value Ref Range Status   SARS Coronavirus 2 by RT PCR NEGATIVE NEGATIVE Final    Comment: Performed at [facility], [address], [place], [place] [address]  Radiology Studies: No results found.         LOS: 4 days   Time spent= 35 mins    Forrester Blando Joline Maxcy, MD [facility]  If 7PM-7AM, please contact night-coverage  10/13/2022, 7:57 AM

## 2022-10-14 DIAGNOSIS — J441 Chronic obstructive pulmonary disease with (acute) exacerbation: Secondary | ICD-10-CM | POA: Diagnosis not present

## 2022-10-14 LAB — BASIC METABOLIC PANEL
Anion gap: 9 (ref 5–15)
BUN: 38 mg/dL — ABNORMAL HIGH (ref 8–23)
CO2: 30 mmol/L (ref 22–32)
Calcium: 9.5 mg/dL (ref 8.9–10.3)
Chloride: 94 mmol/L — ABNORMAL LOW (ref 98–111)
Creatinine, Ser: 1.42 mg/dL — ABNORMAL HIGH (ref 0.44–1.00)
GFR, Estimated: 40 mL/min — ABNORMAL LOW (ref >60)
Glucose, Bld: 146 mg/dL — ABNORMAL HIGH (ref 70–99)
Potassium: 3.9 mmol/L (ref 3.5–5.1)
Sodium: 133 mmol/L — ABNORMAL LOW (ref 135–145)

## 2022-10-14 LAB — CBC
HCT: 36 % (ref 36.0–46.0)
Hemoglobin: 11.4 g/dL — ABNORMAL LOW (ref 12.0–15.0)
MCH: 30.7 pg (ref 26.0–34.0)
MCHC: 31.7 g/dL (ref 30.0–36.0)
MCV: 97 fL (ref 80.0–100.0)
Platelets: 269 10*3/uL (ref 150–400)
RBC: 3.71 MIL/uL — ABNORMAL LOW (ref 3.87–5.11)
RDW: 13.8 % (ref 11.5–15.5)
WBC: 16.9 10*3/uL — ABNORMAL HIGH (ref 4.0–10.5)
nRBC: 0 % (ref 0.0–0.2)

## 2022-10-14 LAB — MAGNESIUM: Magnesium: 2.2 mg/dL (ref 1.7–2.4)

## 2022-10-14 LAB — PHOSPHORUS: Phosphorus: 3.1 mg/dL (ref 2.5–4.6)

## 2022-10-14 MED ORDER — ALPRAZOLAM 0.5 MG PO TABS
1.0000 mg | ORAL_TABLET | Freq: Four times a day (QID) | ORAL | Status: DC
  Administered 2022-10-14 – 2022-10-15 (×4): 1 mg via ORAL
  Filled 2022-10-14 (×4): qty 2

## 2022-10-14 MED ORDER — IPRATROPIUM-ALBUTEROL 0.5-2.5 (3) MG/3ML IN SOLN
3.0000 mL | Freq: Two times a day (BID) | RESPIRATORY_TRACT | Status: DC
  Filled 2022-10-14: qty 3

## 2022-10-14 NOTE — Progress Notes (Signed)
This chaplain responded to the RN consult for spiritual care in the setting of EOL and major life transition. The chaplain understands Dr Nelson Chimes was present with the Pt. and the Pt. husband-Rodney before the chaplain's visit.  The chaplain listened reflectively as the Pt. declined the spiritual care visit and clarified the context of the next visit.   The chaplain understands the Pt. prefers d/c not occur today. The Pt. considers the hospital a place of safety and comfort as she updates family on her illness. The chaplain recognized the Pt. courage and desire to take one day at a time.   The Pt. leans on her husband for strength and often copes through humor. The Pt. identifies moving through the process is important. The Pt. shares a smooth transition from inpatient to outpatient Palliative Care is essential.  The Pt., Kim [name], and the chaplain agreed on a F/U spiritual care visit at the time the Pt. requests through the RN.   Chaplain Kamyra Schroeck [phone]

## 2022-10-14 NOTE — Progress Notes (Signed)
PROGRESS NOTE    Kim [name]  ZOX:[id number] DOB: [birth year] DOA: 10/09/2022 PCP: Shirline Frees, NP   Brief Narrative:   67 y.o. 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  Patient very slow to improve, some signs of volume overload therefore continued Lasix and placed on fluid restriction to keep a bit on the drier side.  Seen by palliative care service as well. Patient aware that her condition is chronic with no long-term care.  I have explained that she would benefit from home with hospice.  Assessment & Plan:  Principal Problem:   COPD with acute exacerbation (HCC) Active Problems:   Chronic systolic CHF (congestive heart failure) (HCC)   Essential hypertension   Stage 3a chronic kidney disease (CKD) (HCC)   Dyslipidemia   DNR (do not resuscitate)   AKI (acute kidney injury) (HCC)    COPD exacerbation End-stage COPD Chronic respiratory failure Recurrent severe symptoms of end-stage COPD with multiple admissions.  Chronically on steroids at home. Still has quite a bit of exertional SOB -Continue azithromycin, total 5 days - P.o. prednisone, this she will require prolonged taper down to her home regimen - Continue bronchodilators scheduled and as needed.  I-S/flutter valve - Seen by palliative, recommending outpatient follow-up.  I highly recommended patient that she would benefit from home with hospice    Acute on chronic congestive heart failure with reduced ejection fraction, 35%.  Grade 1 DD.  Stage III -Has elevated BNP.  Echocardiogram from June 2024 shows EF of 35%, grade 1 DD.  Currently on Lasix 20 mg daily, Aldactone, bisoprolol, aspirin.  Will put on fluid restriction   Acute kidney injury on CKD stage IIIb Admission creatinine 1.56, now down to baseline of 1.2   Hyperlipidemia -Continue Zetia and Crestor   Anxiety -Continue Xanax, Paxil.  Due to significant  anxiety, increased Xanax   Sleep apnea - Continue CPAP at night  PT/OT = HH; face to face completed.   DVT prophylaxis: Lovenox Code Status: DNR Family Communication:   Status is: Inpatient On going treatment for COPD.  Significant anxiety leading to some shortness of breath.  Plans to home discharge tomorrow       Diet Orders (From admission, onward)     Start     Ordered   10/13/22 0803  Diet regular Room service appropriate? Yes; Fluid consistency: Thin; Fluid restriction: 1500 mL Fluid  Diet effective now       Question Answer Comment  Room service appropriate? Yes   Fluid consistency: Thin   Fluid restriction: 1500 mL Fluid      10/13/22 0802            Subjective: Very anxious and fearful this morning.  Elevated blood pressure was in the room.  Overall denies any more shortness of breath than usual.  I explained to her and her husband that this anxiety and fear is leading to most of her issues  Examination:  General exam: Appears calm and comfortable; ANXIOUS Respiratory system: b/l rhonchi- improved  Cardiovascular system: S1 & S2 heard, RRR. No JVD, murmurs, rubs, gallops or clicks. No pedal edema. Gastrointestinal system: Abdomen is nondistended, soft and nontender. No organomegaly or masses felt. Normal bowel sounds heard. Central nervous system: Alert and oriented. No focal neurological deficits. Extremities: Symmetric 5 x 5 power. Skin: No rashes, lesions or ulcers Psychiatry: Judgement and insight appear normal. Mood &  affect appropriate.Anxious  Objective: Vitals:   10/14/22 0051 10/14/22 0457 10/14/22 0752 10/14/22 1023  BP: 120/64 115/70 105/69   Pulse: 73 65 66   Resp:   18   Temp: (!) 97.4 F (36.3 C) (!) 97 F (36.1 C) (!) 96.2 F (35.7 C)   TempSrc: Oral Oral Axillary   SpO2: 96% 97% 93%   Weight:    65.7 kg  Height:        Intake/Output Summary (Last 24 hours) at 10/14/2022 1035 Last data filed at 10/13/2022 2200 Gross per 24 hour   Intake 243 ml  Output 700 ml  Net -457 ml   Filed Weights   10/12/22 0449 10/13/22 0420 10/14/22 1023  Weight: 66.2 kg 64.5 kg 65.7 kg    Scheduled Meds:  ALPRAZolam  1 mg Oral Q6H   arformoterol  15 mcg Nebulization BID   aspirin EC  81 mg Oral Daily   azithromycin  250 mg Oral Daily   bisoprolol  5 mg Oral Daily   budesonide (PULMICORT) nebulizer solution  0.5 mg Nebulization BID   docusate sodium  100 mg Oral BID   enoxaparin (LOVENOX) injection  40 mg Subcutaneous Q24H   ezetimibe  10 mg Oral Daily   furosemide  20 mg Oral Daily   guaiFENesin  1,200 mg Oral Q12H   ipratropium-albuterol  3 mL Nebulization TID   lactose free nutrition  237 mL Oral BID BM   multivitamin with minerals  1 tablet Oral Daily   nystatin  5 mL Oral QID   PARoxetine  40 mg Oral Daily   predniSONE  40 mg Oral Q breakfast   revefenacin  175 mcg Nebulization Daily   rosuvastatin  20 mg Oral Daily   sodium chloride flush  3 mL Intravenous Q12H   spironolactone  12.5 mg Oral Daily   Continuous Infusions:  Nutritional status Signs/Symptoms: estimated needs Interventions: MVI, Boost Plus, Education, Refer to RD note for recommendations Body mass index is 27.37 kg/m.  Data Reviewed:   CBC: Recent Labs  Lab 10/09/22 0945 10/10/22 0419 10/13/22 0807 10/14/22 0312  WBC  --  19.1* 16.8* 16.9*  HGB 12.6 11.1* 12.7 11.4*  HCT 37.0 35.1* 40.6 36.0  MCV  --  101.2* 98.8 97.0  PLT  --  222 288 269   Basic Metabolic Panel: Recent Labs  Lab 10/09/22 0939 10/09/22 0945 10/10/22 0419 10/13/22 0807 10/14/22 0312  NA 141 137 137 139 133*  K 4.3 4.2 4.5 4.9 3.9  CL 104  --  103 98 94*  CO2 25  --  26 31 30   GLUCOSE 319*  --  176* 93 146*  BUN 32*  --  34* 23 38*  CREATININE 1.56*  --  1.22* 0.97 1.42*  CALCIUM 9.0  --  8.8* 10.1 9.5  MG  --   --   --  2.3 2.2  PHOS  --   --   --   --  3.1   GFR: Estimated Creatinine Clearance: 32.9 mL/min (A) (by C-G formula based on SCr of 1.42 mg/dL  (H)). Liver Function Tests: Recent Labs  Lab 10/09/22 0939  AST 34  ALT 32  ALKPHOS 66  BILITOT 0.5  PROT 5.4*  ALBUMIN 2.9*   No results for input(s): "LIPASE", "AMYLASE" in the last 168 hours. No results for input(s): "AMMONIA" in the last 168 hours. Coagulation Profile: No results for input(s): "INR", "PROTIME" in the last 168 hours. Cardiac Enzymes: No results  for input(s): "CKTOTAL", "CKMB", "CKMBINDEX", "TROPONINI" in the last 168 hours. BNP (last 3 results) No results for input(s): "PROBNP" in the last 8760 hours. HbA1C: No results for input(s): "HGBA1C" in the last 72 hours. CBG: No results for input(s): "GLUCAP" in the last 168 hours. Lipid Profile: No results for input(s): "CHOL", "HDL", "LDLCALC", "TRIG", "CHOLHDL", "LDLDIRECT" in the last 72 hours. Thyroid Function Tests: No results for input(s): "TSH", "T4TOTAL", "FREET4", "T3FREE", "THYROIDAB" in the last 72 hours. Anemia Panel: No results for input(s): "VITAMINB12", "FOLATE", "FERRITIN", "TIBC", "IRON", "RETICCTPCT" in the last 72 hours. Sepsis Labs: Recent Labs  Lab 10/09/22 1241  PROCALCITON 0.43    Recent Results (from the past 240 hour(s))  SARS Coronavirus 2 by RT PCR (hospital order, performed in [facility] hospital lab) *cepheid single result test* Anterior Nasal Swab     Status: None   Collection Time: 10/04/22 12:00 PM   Specimen: Anterior Nasal Swab  Result Value Ref Range Status   SARS Coronavirus 2 by RT PCR NEGATIVE NEGATIVE Final    Comment: Performed at [facility], [address] [place], [place] [address]         Radiology Studies: No results found.         LOS: 5 days   Time spent= 35 mins    Kim [name] Joline Maxcy, MD [facility]  If 7PM-7AM, please contact night-coverage  10/14/2022, 10:35 AM

## 2022-10-14 NOTE — Progress Notes (Signed)
Husband will place pt on when ready.   10/14/22 2230  BiPAP/CPAP/SIPAP  BiPAP/CPAP/SIPAP Pt Type Adult  BiPAP/CPAP/SIPAP Resmed  Mask Type Full face mask

## 2022-10-15 ENCOUNTER — Other Ambulatory Visit (HOSPITAL_COMMUNITY): Payer: Self-pay

## 2022-10-15 ENCOUNTER — Telehealth: Payer: Self-pay | Admitting: Adult Health

## 2022-10-15 ENCOUNTER — Telehealth (HOSPITAL_COMMUNITY): Payer: Self-pay | Admitting: Pharmacy Technician

## 2022-10-15 DIAGNOSIS — J441 Chronic obstructive pulmonary disease with (acute) exacerbation: Secondary | ICD-10-CM | POA: Diagnosis not present

## 2022-10-15 LAB — CBC
HCT: 35.5 % — ABNORMAL LOW (ref 36.0–46.0)
Hemoglobin: 11.5 g/dL — ABNORMAL LOW (ref 12.0–15.0)
MCH: 32 pg (ref 26.0–34.0)
MCHC: 32.4 g/dL (ref 30.0–36.0)
MCV: 98.9 fL (ref 80.0–100.0)
Platelets: 277 10*3/uL (ref 150–400)
RBC: 3.59 MIL/uL — ABNORMAL LOW (ref 3.87–5.11)
RDW: 13.9 % (ref 11.5–15.5)
WBC: 20.7 10*3/uL — ABNORMAL HIGH (ref 4.0–10.5)
nRBC: 0 % (ref 0.0–0.2)

## 2022-10-15 LAB — BASIC METABOLIC PANEL
Anion gap: 9 (ref 5–15)
BUN: 30 mg/dL — ABNORMAL HIGH (ref 8–23)
CO2: 27 mmol/L (ref 22–32)
Calcium: 9.6 mg/dL (ref 8.9–10.3)
Chloride: 102 mmol/L (ref 98–111)
Creatinine, Ser: 1.1 mg/dL — ABNORMAL HIGH (ref 0.44–1.00)
GFR, Estimated: 55 mL/min — ABNORMAL LOW (ref >60)
Glucose, Bld: 128 mg/dL — ABNORMAL HIGH (ref 70–99)
Potassium: 3.6 mmol/L (ref 3.5–5.1)
Sodium: 138 mmol/L (ref 135–145)

## 2022-10-15 LAB — MAGNESIUM: Magnesium: 2.2 mg/dL (ref 1.7–2.4)

## 2022-10-15 MED ORDER — FUROSEMIDE 20 MG PO TABS
20.0000 mg | ORAL_TABLET | Freq: Every day | ORAL | 0 refills | Status: DC
  Filled 2022-10-15: qty 30, 30d supply, fill #0

## 2022-10-15 MED ORDER — IPRATROPIUM-ALBUTEROL 0.5-2.5 (3) MG/3ML IN SOLN
3.0000 mL | RESPIRATORY_TRACT | 0 refills | Status: AC | PRN
  Filled 2022-10-15: qty 180, 10d supply, fill #0

## 2022-10-15 MED ORDER — HYDROMORPHONE HCL 2 MG PO TABS
2.0000 mg | ORAL_TABLET | Freq: Four times a day (QID) | ORAL | 0 refills | Status: DC | PRN
  Filled 2022-10-15: qty 15, 4d supply, fill #0

## 2022-10-15 MED ORDER — BUDESONIDE-FORMOTEROL FUMARATE 160-4.5 MCG/ACT IN AERO
2.0000 | INHALATION_SPRAY | Freq: Two times a day (BID) | RESPIRATORY_TRACT | 0 refills | Status: DC
Start: 2022-10-15 — End: 2023-08-07
  Filled 2022-10-15: qty 10.2, 30d supply, fill #0

## 2022-10-15 MED ORDER — ALBUTEROL SULFATE HFA 108 (90 BASE) MCG/ACT IN AERS
2.0000 | INHALATION_SPRAY | Freq: Four times a day (QID) | RESPIRATORY_TRACT | 0 refills | Status: DC | PRN
  Filled 2022-10-15: qty 18, 25d supply, fill #0

## 2022-10-15 MED ORDER — PREDNISONE 10 MG PO TABS
ORAL_TABLET | ORAL | 0 refills | Status: AC
  Filled 2022-10-15: qty 48, 30d supply, fill #0

## 2022-10-15 MED ORDER — IPRATROPIUM-ALBUTEROL 0.5-2.5 (3) MG/3ML IN SOLN
3.0000 mL | Freq: Two times a day (BID) | RESPIRATORY_TRACT | 0 refills | Status: AC
Start: 2022-10-15 — End: 2022-11-14
  Filled 2022-10-15: qty 180, 15d supply, fill #0

## 2022-10-15 MED ORDER — ALPRAZOLAM 1 MG PO TABS
1.0000 mg | ORAL_TABLET | Freq: Four times a day (QID) | ORAL | 0 refills | Status: DC
  Filled 2022-10-15: qty 15, 4d supply, fill #0

## 2022-10-15 NOTE — Progress Notes (Signed)
OT Cancellation Note  Patient Details Name: Kim [name] MRN: [id number] DOB: 05-04-1955   Cancelled Treatment:    Reason Eval/Treat Not Completed: Other (comment) (Pt declined secondary to getting ready to discharge and stating she would like to save all of her energy for transport home/getting into the home. Acute OT to continue to follow as appropriate.)  Audri Kozub "Orson Eva., OTR/L, MA Acute Rehab [phone]   Lendon Colonel 10/15/2022, 10:34 AM

## 2022-10-15 NOTE — Telephone Encounter (Signed)
Pharmacy Patient Advocate Encounter   Received notification that prior authorization for HYDROmorphone HCl 2MG  tablets is required/requested.    PA submitted to [facility] via CoverMyMeds Key/confirmation #/EOC [facility] Status is pending

## 2022-10-15 NOTE — Telephone Encounter (Addendum)
Cordelia Pen with [facility] [phone]  Pt was  Discharged from [facility] today 10/15/22 Pt will be under [facility] for Palliative Care (tentatively by end of week)

## 2022-10-15 NOTE — Telephone Encounter (Signed)
Pharmacy Patient Advocate Encounter  Received notification from [facility] that Prior Authorization for HYDROmorphone HCl 2MG  tablets  has been APPROVED from 10/15/2022 to 04/07/2023.Marland Kitchen  PA #/Case ID/Reference #: [id number]

## 2022-10-15 NOTE — TOC Transition Note (Signed)
Transition of Care [facility]) - CM/SW Discharge Note   Patient Details  Name: Kim [name] MRN: [id number] Date of Birth: 1955/12/06  [facility]) CM/SW Contact:  Gordy Clement, RN Phone Number: 10/15/2022, 10:49 AM   Clinical Narrative:     Patient will dc to home today   [facility] will provide SN, PT and OT. [facility] will be providing the palliative services. A shower chair/ tub bench was recommended but patient is declining because insurance will not cover. She will acquire on her own. Husband will be transportation home.             Patient Goals and CMS Choice      Discharge Placement                         Discharge Plan and Services Additional resources added to the After Visit Summary for                                       Social Determinants of Health (SDOH) Interventions SDOH Screenings   Food Insecurity: No Food Insecurity (10/09/2022)  Housing: Patient Declined (10/09/2022)  Transportation Needs: No Transportation Needs (10/09/2022)  Utilities: Not At Risk (10/09/2022)  Alcohol Screen: Low Risk  (03/22/2020)  Depression (PHQ2-9): Medium Risk (09/10/2021)  Financial Resource Strain: High Risk (09/10/2021)  Physical Activity: Inactive (03/25/2021)  Social Connections: Moderately Isolated (03/25/2021)  Stress: No Stress Concern Present (09/10/2021)  Tobacco Use: Medium Risk (10/09/2022)     Readmission Risk Interventions    09/20/2021    3:27 PM  Readmission Risk Prevention Plan  Transportation Screening Complete  Medication Review (RN Care Manager) Complete  PCP or Specialist appointment within 3-5 days of discharge Complete  HRI or Home Care Consult Complete  SW Recovery Care/Counseling Consult Complete  Palliative Care Screening Not Applicable  Skilled Nursing Facility Not Applicable

## 2022-10-15 NOTE — Discharge Summary (Signed)
Physician Discharge Summary  KIAUNA ZYWICKI [name]:[id number] DOB: Sep 06, 1955 DOA: 10/09/2022  PCP: Shirline Frees, NP  Admit date: 10/09/2022 Discharge date: 10/15/2022  Admitted From: Home Disposition:  Home  Recommendations for Outpatient Follow-up:  Follow up with PCP in 1-2 weeks Please obtain BMP/CBC in 3-5 to days Prolonged prednisone taper has been prescribed.  Eventually she will be on prednisone 10 mg orally daily.  Follow-up outpatient pulmonary. DuoNebs twice daily, every 6 as needed additional.  Rescue inhaler prescribed. Xanax increased to 1 mg every 6 hours. Lasix 20 mg daily.  Additional 40 mg as needed whenever she needs for fluid/edema/swelling Outpatient Palliative follow up.    Discharge Condition: Stable CODE STATUS: DNR Diet recommendation: Heart healthy  Brief/Interim Summary:  67 y.o. 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  Patient very slow to improve, some signs of volume overload therefore continued Lasix and placed on fluid restriction to keep a bit on the drier side.  Seen by palliative care service as well. Patient aware that her condition is chronic with no long-term care.  I have explained that she would benefit from home with hospice.  Over the course of several days patient started doing well on steroids, aggressive bronchodilators.  She also received some diuretics.  Eventually she will be discharged today with recommendations as mentioned above. Patient is aware that a lot of her shortness of breath and elevated blood pressure is driven by her anxiety.  Her lungs are overall clear to auscultation.  Husband present in bed side, all the instructions given by me.   Assessment & Plan:  Principal Problem:   COPD with acute exacerbation (HCC) Active Problems:   Chronic systolic CHF (congestive heart failure) (HCC)   Essential hypertension   Stage 3a  chronic kidney disease (CKD) (HCC)   Dyslipidemia   DNR (do not resuscitate)   AKI (acute kidney injury) (HCC)     COPD exacerbation End-stage COPD Chronic respiratory failure Recurrent severe symptoms of end-stage COPD with multiple admissions.  Currently she will be on prolonged prednisone taper eventually should be on 10 mg orally daily.  Bronchodilators scheduled and as necessary has been prescribed.  Completed 5-day course of azithromycin in the hospital.  A lot of her shortness of breath and elevated blood pressure is driven by her anxiety.  Patient also seen by palliative care service in the hospital, I recommended she would benefit from home hospice but at this time they have opted for outpatient palliative follow-up     Acute on chronic congestive heart failure with reduced ejection fraction, 35%.  Grade 1 DD.  Stage III -Has elevated BNP.  Echocardiogram from June 2024 shows EF of 35%, grade 1 DD.  Continue Aldactone, bisoprolol and aspirin.  Started on 20 mg orally daily Lasix, can take additional dose if necessary at home for shortness of breath, edema and swelling   Acute kidney injury on CKD stage IIIb Admission creatinine 1.56, now down to baseline of 1.2   Hyperlipidemia -Continue Zetia and Crestor   Anxiety -Continue Xanax, Paxil.  Due to significant anxiety, increased Xanax   Sleep apnea - Continue CPAP at night   Discharge Diagnoses:  Principal Problem:   COPD with acute exacerbation (HCC) Active Problems:   Chronic systolic CHF (congestive heart failure) (HCC)   Essential hypertension   Stage 3a chronic kidney disease (CKD) (HCC)   Dyslipidemia  DNR (do not resuscitate)   AKI (acute kidney injury) (HCC)      Consultations: Palliative care  Subjective: Feeling better today, no complaints Husband present at bedside  Discharge Exam: Vitals:   10/15/22 0749 10/15/22 0800  BP:  (!) 159/73  Pulse:  73  Resp:  18  Temp:  (!) 97.5 F (36.4 C)   SpO2: 96% 97%   Vitals:   10/14/22 2328 10/15/22 0351 10/15/22 0749 10/15/22 0800  BP: 121/69 123/65  (!) 159/73  Pulse: 73 67  73  Resp:    18  Temp: (!) 97.2 F (36.2 C) (!) 97.4 F (36.3 C)  (!) 97.5 F (36.4 C)  TempSrc: Axillary Axillary  Oral  SpO2: 96% 95% 96% 97%  Weight:      Height:        General: Pt is alert, awake, not in acute distress Cardiovascular: RRR, S1/S2 +, no rubs, no gallops Respiratory: CTA bilaterally, no wheezing, no rhonchi Abdominal: Soft, NT, ND, bowel sounds + Extremities: no edema, no cyanosis  Discharge Instructions   Allergies as of 10/15/2022       Reactions   Tape Other (See Comments)   Skin tears easily, peels skin off   Daliresp [roflumilast] Other (See Comments)   Skin peeling   Cozaar [losartan] Other (See Comments)   Possible cause of severe hypotension   Dupixent [dupilumab] Diarrhea, Swelling, Cough   Facial swelling Headache   Latex Other (See Comments)   Peels skin off   Morphine And Codeine Other (See Comments)   Hallucinations    Penicillins Hives   Statins Other (See Comments)   Myalgia   Zestril [lisinopril] Cough   Omnicef [cefdinir] Rash        Medication List     STOP taking these medications    potassium chloride 10 MEQ tablet Commonly known as: KLOR-CON       TAKE these medications    albuterol 108 (90 Base) MCG/ACT inhaler Commonly known as: VENTOLIN HFA INHALE 2 PUFFS INTO THE LUNGS EVERY 4 TO 6 HOURS AS NEEDED FOR SHORTNESS OF BREATH OR WHEEZING What changed: See the new instructions.   Ventolin HFA 108 (90 Base) MCG/ACT inhaler Generic drug: albuterol Inhale 2 puffs into the lungs every 6 (six) hours as needed for wheezing or shortness of breath. What changed: You were already taking a medication with the same name, and this prescription was added. Make sure you understand how and when to take each.   ALPRAZolam 1 MG tablet Commonly known as: XANAX Take 1 tablet (1 mg total) by mouth  every 6 (six) hours. What changed:  medication strength how much to take   ARTIFICIAL TEARS OP Place 1 drop into both eyes 2 (two) times daily as needed (dryness, irritation).   aspirin EC 81 MG tablet Take 1 tablet (81 mg total) by mouth daily.   bisoprolol 5 MG tablet Commonly known as: ZEBETA Take 0.5 tablets (2.5 mg total) by mouth daily as needed (Take HALF tablet by mouth for break through chest pain as needed). What changed: when to take this   butalbital-acetaminophen-caffeine 50-325-40 MG tablet Commonly known as: FIORICET Take 1 tablet by mouth every 4 (four) hours as needed for headache. TAKE 1 TABLET BY MOUTH EVERY 4 HOURS AS NEEDED FOR HEADACHETAKE 1 TABLET BY MOUTH EVERY 4 HOURS AS NEEDED FOR HEADACHE   cetirizine 10 MG tablet Commonly known as: ZYRTEC Take 10 mg by mouth daily.   ezetimibe 10 MG tablet  Commonly known as: ZETIA TAKE 1 TABLET(10 MG) BY MOUTH DAILY What changed: See the new instructions.   furosemide 40 MG tablet Commonly known as: LASIX Take 1 tablet (40 mg total) by mouth daily. What changed:  when to take this reasons to take this   furosemide 20 MG tablet Commonly known as: LASIX Take 1 tablet (20 mg total) by mouth daily. What changed: You were already taking a medication with the same name, and this prescription was added. Make sure you understand how and when to take each.   HYDROcodone-acetaminophen 7.5-325 MG tablet Commonly known as: Norco Take 1 tablet by mouth every 6 (six) hours as needed for moderate pain.   HYDROmorphone 2 MG tablet Commonly known as: DILAUDID Take 1 tablet (2 mg total) by mouth every 6 (six) hours as needed for severe pain (dyspnea).   ipratropium-albuterol 0.5-2.5 (3) MG/3ML Soln Commonly known as: DUONEB Take 3 mLs by nebulization 2 (two) times daily, AND 3 ML via nebulizer every 6  hours as needed. What changed:  how much to take how to take this when to take this additional instructions    ipratropium-albuterol 0.5-2.5 (3) MG/3ML Soln Commonly known as: DUONEB Take 3 mLs by nebulization every 4 (four) hours as needed. What changed: You were already taking a medication with the same name, and this prescription was added. Make sure you understand how and when to take each.   magnesium oxide 400 MG tablet Commonly known as: MAG-OX Take 400 mg by mouth daily as needed (constipation).   Mucinex Maximum Strength 1200 MG Tb12 Generic drug: Guaifenesin Take 1,200 mg by mouth every 12 (twelve) hours.   nitroGLYCERIN 0.4 MG SL tablet Commonly known as: NITROSTAT Place 1 tablet (0.4 mg total) under the tongue every 5 (five) minutes as needed for chest pain.   nystatin 100000 UNIT/ML suspension Commonly known as: MYCOSTATIN Take 5 mLs (500,000 Units total) by mouth 4 (four) times daily.   ondansetron 4 MG tablet Commonly known as: Zofran Take 1 tablet (4 mg total) by mouth every 8 (eight) hours as needed for nausea or vomiting.   OXYGEN Inhale 2-3 L/min into the lungs continuous.   PARoxetine 40 MG tablet Commonly known as: PAXIL TAKE 1 TABLET(40 MG) BY MOUTH EVERY MORNING What changed: See the new instructions.   polyethylene glycol powder 17 GM/SCOOP powder Commonly known as: GLYCOLAX/MIRALAX Take 17 g by mouth daily as needed for moderate constipation (constipation).   predniSONE 10 MG tablet Commonly known as: DELTASONE Take 4 tablets (40 mg total) by mouth daily with breakfast for 3 days, THEN 3 tablets (30 mg total) daily with breakfast for 3 days, THEN 2 tablets (20 mg total) daily with breakfast for 3 days, THEN 1 tablet (10 mg total) daily with breakfast for 21 days. Start taking on: October 15, 2022 What changed: See the new instructions.   rosuvastatin 20 MG tablet Commonly known as: CRESTOR Take 1 tablet (20 mg total) by mouth daily.   spironolactone 25 MG tablet Commonly known as: ALDACTONE Take 0.5 tablets (12.5 mg total) by mouth daily.   Symbicort  160-4.5 MCG/ACT inhaler Generic drug: budesonide-formoterol Inhale 2 puffs into the lungs 2 (two) times daily.   VITAMIN B-12 PO Take 1 tablet by mouth daily.   VITAMIN D3 PO Take 5,000 Units by mouth daily.        Follow-up Information     Nafziger, Kandee Keen, NP Follow up in 1 week(s).   Specialty: Family Medicine Contact information:  [address] [place] [address] [phone]                Allergies  Allergen Reactions   Tape Other (See Comments)    Skin tears easily, peels skin off   Daliresp [Roflumilast] Other (See Comments)    Skin peeling   Cozaar [Losartan] Other (See Comments)    Possible cause of severe hypotension   Dupixent [Dupilumab] Diarrhea, Swelling and Cough    Facial swelling Headache   Latex Other (See Comments)    Peels skin off   Morphine And Codeine Other (See Comments)    Hallucinations    Penicillins Hives   Statins Other (See Comments)    Myalgia    Zestril [Lisinopril] Cough   Omnicef [Cefdinir] Rash    You were cared for by a hospitalist during your hospital stay. If you have any questions about your discharge medications or the care you received while you were in the hospital after you are discharged, you can call the unit and asked to speak with the hospitalist on call if the hospitalist that took care of you is not available. Once you are discharged, your primary care physician will handle any further medical issues. Please note that no refills for any discharge medications will be authorized once you are discharged, as it is imperative that you return to your primary care physician (or establish a relationship with a primary care physician if you do not have one) for your aftercare needs so that they can reassess your need for medications and monitor your lab values.  You were cared for by a hospitalist during your hospital stay. If you have any questions about your discharge medications or the care you received while  you were in the hospital after you are discharged, you can call the unit and asked to speak with the hospitalist on call if the hospitalist that took care of you is not available. Once you are discharged, your primary care physician will handle any further medical issues. Please note that NO REFILLS for any discharge medications will be authorized once you are discharged, as it is imperative that you return to your primary care physician (or establish a relationship with a primary care physician if you do not have one) for your aftercare needs so that they can reassess your need for medications and monitor your lab values.  Please request your Prim.MD to go over all Hospital Tests and Procedure/Radiological results at the follow up, please get all Hospital records sent to your Prim MD by signing hospital release before you go home.  Get CBC, CMP, 2 view Chest X ray checked  by Primary MD during your next visit or SNF MD in 5-7 days ( we routinely change or add medications that can affect your baseline labs and fluid status, therefore we recommend that you get the mentioned basic workup next visit with your PCP, your PCP may decide not to get them or add new tests based on their clinical decision)  On your next visit with your primary care physician please Get Medicines reviewed and adjusted.  If you experience worsening of your admission symptoms, develop shortness of breath, life threatening emergency, suicidal or homicidal thoughts you must seek medical attention immediately by calling 911 or calling your MD immediately  if symptoms less severe.  You Must read complete instructions/literature along with all the possible adverse reactions/side effects for all the Medicines you take and that have been prescribed to you. Take any  new Medicines after you have completely understood and accpet all the possible adverse reactions/side effects.   Do not drive, operate heavy machinery, perform activities at  heights, swimming or participation in water activities or provide baby sitting services if your were admitted for syncope or siezures until you have seen by Primary MD or a Neurologist and advised to do so again.  Do not drive when taking Pain medications.   Procedures/Studies: DG Chest Portable 1 View  Result Date: 10/09/2022 CLINICAL DATA:  Shortness of breath EXAM: PORTABLE CHEST 1 VIEW COMPARISON:  Chest x-ray dated October 04, 2022 FINDINGS: Cardiac and mediastinal contours are unchanged. Emphysema. Mild bibasilar opacities likely due to scarring or atelectasis. No focal consolidation. No evidence of pneumothorax. No evidence of pleural effusion. IMPRESSION: Emphysema. No focal consolidation. Electronically Signed   By: Allegra Lai M.D.   On: 10/09/2022 10:04   DG Chest Portable 1 View  Result Date: 10/04/2022 CLINICAL DATA:  Shortness of breath EXAM: PORTABLE CHEST 1 VIEW COMPARISON:  Chest x-ray September 10, 2022 FINDINGS: The cardiomediastinal silhouette is unchanged in contour. Calcified atherosclerosis of the aortic arch. Bibasilar bandlike opacities, likely atelectasis. No additional focal pulmonary opacity. No pleural effusion or pneumothorax. The visualized upper abdomen is unremarkable. No acute osseous abnormality. IMPRESSION: Bibasilar bandlike opacities, likely atelectasis. Electronically Signed   By: Jacob Moores M.D.   On: 10/04/2022 13:46   VAS US CAROTID  Result Date: 09/22/2022 Carotid Arterial Duplex Study Patient Name:  ESMEE FALLAW  Date of Exam:   09/22/2022 Medical Rec #: [id number]        Accession #:    [id number] Date of Birth: 04/03/1956       Patient Gender: F Patient Age:   34 years Exam Location:  [facility] Procedure:      VAS US CAROTID Referring Phys: Eligha Bridegroom --------------------------------------------------------------------------------  Indications:       Carotid artery disease and Patient complains of dizziness                    after standing up.  Otherwise, she denies any other                    cerebrovascular symptoms today. Risk Factors:      Hypertension, hyperlipidemia, past history of smoking, prior                    MI, coronary artery disease. Other Factors:     History of COPD, patient on continuous oxygen today. Limitations        Today's exam was limited due to the patient's respiratory                    variation. Comparison Study:  Prior carotid duplex exam on 09/20/2021 showed highest                    velocities in right mid ICA 161/44 cm/s and left proximal ICA                    91/22 cm/s. Performing Technologist: Carlos American RVT, RDCS (AE), RDMS  Examination Guidelines: A complete evaluation includes B-mode imaging, spectral Doppler, color Doppler, and power Doppler as needed of all accessible portions of each vessel. Bilateral testing is considered an integral part of a complete examination. Limited examinations for reoccurring indications may be performed as noted.  Right Carotid Findings: +----------+--------+--------+--------+------------------+------------------+  PSV cm/sEDV cm/sStenosisPlaque DescriptionComments           +----------+--------+--------+--------+------------------+------------------+ CCA Prox  67      11                                                   +----------+--------+--------+--------+------------------+------------------+ CCA Distal63      12                                intimal thickening +----------+--------+--------+--------+------------------+------------------+ ICA Prox  81      12                                intimal thickening +----------+--------+--------+--------+------------------+------------------+ ICA Mid   65      13                                tortuous           +----------+--------+--------+--------+------------------+------------------+ ICA Distal92      23                                tortuous            +----------+--------+--------+--------+------------------+------------------+ ECA       98      9                                 intimal thickening +----------+--------+--------+--------+------------------+------------------+ +----------+--------+-------+----------------+-------------------+           PSV cm/sEDV cmsDescribe        Arm Pressure (mmHG) +----------+--------+-------+----------------+-------------------+ Subclavian140            Multiphasic, [id number]                 +----------+--------+-------+----------------+-------------------+ +---------+--------+--+--------+--+---------+ VertebralPSV cm/s74EDV cm/s12Antegrade +---------+--------+--+--------+--+---------+ Unable to duplicate prior ICA velocities, vessel is tortuous with no evidence of stenosis or plaque seen.  Left Carotid Findings: +----------+--------+--------+--------+--------------------+------------------+           PSV cm/sEDV cm/sStenosisPlaque Description  Comments           +----------+--------+--------+--------+--------------------+------------------+ CCA Prox  73      11                                                     +----------+--------+--------+--------+--------------------+------------------+ CCA Mid                           diffuse and calcific                   +----------+--------+--------+--------+--------------------+------------------+ CCA Distal76      16                                  intimal thickening +----------+--------+--------+--------+--------------------+------------------+ ICA Prox  54      10  intimal thickening +----------+--------+--------+--------+--------------------+------------------+ ICA Mid   68      21                                                     +----------+--------+--------+--------+--------------------+------------------+ ICA Distal77      21                                                      +----------+--------+--------+--------+--------------------+------------------+ ECA       56      8                                                      +----------+--------+--------+--------+--------------------+------------------+ +----------+--------+--------+----------------+-------------------+           PSV cm/sEDV cm/sDescribe        Arm Pressure (mmHG) +----------+--------+--------+----------------+-------------------+ Subclavian218             Multiphasic, ZOX096                 +----------+--------+--------+----------------+-------------------+ +---------+--------+--+--------+--+---------+ VertebralPSV cm/s42EDV cm/s11Antegrade +---------+--------+--+--------+--+---------+   Summary: Right Carotid: The extracranial vessels were near-normal with only minimal wall                thickening or plaque. Left Carotid: The extracranial vessels were near-normal with only minimal wall               thickening or plaque. Vertebrals:  Bilateral vertebral arteries demonstrate antegrade flow. Subclavians: Normal flow hemodynamics were seen in bilateral subclavian              arteries. *See table(s) above for measurements and observations.  Electronically signed by Nanetta Batty MD on 09/22/2022 at 5:54:01 PM.    Final      The results of significant diagnostics from this hospitalization (including imaging, microbiology, ancillary and laboratory) are listed below for reference.     Microbiology: No results found for this or any previous visit (from the past 240 hour(s)).   Labs: BNP (last 3 results) Recent Labs    10/04/22 1200 10/09/22 0938 10/13/22 0807  BNP 441.9* 389.2* 217.8*   Basic Metabolic Panel: Recent Labs  Lab 10/09/22 0939 10/09/22 0945 10/10/22 0419 10/13/22 0807 10/14/22 0312 10/15/22 0219  NA 141 137 137 139 133* 138  K 4.3 4.2 4.5 4.9 3.9 3.6  CL 104  --  103 98 94* 102  CO2 25  --  26 31 30 27   GLUCOSE 319*  --  176* 93 146* 128*  BUN  32*  --  34* 23 38* 30*  CREATININE 1.56*  --  1.22* 0.97 1.42* 1.10*  CALCIUM 9.0  --  8.8* 10.1 9.5 9.6  MG  --   --   --  2.3 2.2 2.2  PHOS  --   --   --   --  3.1  --    Liver Function Tests: Recent Labs  Lab 10/09/22 0939  AST 34  ALT 32  ALKPHOS 66  BILITOT 0.5  PROT 5.4*  ALBUMIN 2.9*  No results for input(s): "LIPASE", "AMYLASE" in the last 168 hours. No results for input(s): "AMMONIA" in the last 168 hours. CBC: Recent Labs  Lab 10/09/22 0945 10/10/22 0419 10/13/22 0807 10/14/22 0312 10/15/22 0219  WBC  --  19.1* 16.8* 16.9* 20.7*  HGB 12.6 11.1* 12.7 11.4* 11.5*  HCT 37.0 35.1* 40.6 36.0 35.5*  MCV  --  101.2* 98.8 97.0 98.9  PLT  --  222 288 269 277   Cardiac Enzymes: No results for input(s): "CKTOTAL", "CKMB", "CKMBINDEX", "TROPONINI" in the last 168 hours. BNP: Invalid input(s): "POCBNP" CBG: No results for input(s): "GLUCAP" in the last 168 hours. D-Dimer No results for input(s): "DDIMER" in the last 72 hours. Hgb A1c No results for input(s): "HGBA1C" in the last 72 hours. Lipid Profile No results for input(s): "CHOL", "HDL", "LDLCALC", "TRIG", "CHOLHDL", "LDLDIRECT" in the last 72 hours. Thyroid function studies No results for input(s): "TSH", "T4TOTAL", "T3FREE", "THYROIDAB" in the last 72 hours.  Invalid input(s): "FREET3" Anemia work up No results for input(s): "VITAMINB12", "FOLATE", "FERRITIN", "TIBC", "IRON", "RETICCTPCT" in the last 72 hours. Urinalysis    Component Value Date/Time   COLORURINE YELLOW 08/26/2021 0805   APPEARANCEUR CLEAR 08/26/2021 0805   LABSPEC 1.021 08/26/2021 0805   PHURINE 5.0 08/26/2021 0805   GLUCOSEU NEGATIVE 08/26/2021 0805   HGBUR NEGATIVE 08/26/2021 0805   HGBUR trace-lysed 02/26/2010 0951   BILIRUBINUR NEGATIVE 08/26/2021 0805   BILIRUBINUR negative 04/29/2021 1243   KETONESUR NEGATIVE 08/26/2021 0805   PROTEINUR NEGATIVE 08/26/2021 0805   UROBILINOGEN 0.2 04/29/2021 1243   UROBILINOGEN 0.2  02/26/2010 0951   NITRITE NEGATIVE 08/26/2021 0805   LEUKOCYTESUR NEGATIVE 08/26/2021 0805   Sepsis Labs Recent Labs  Lab 10/10/22 0419 10/13/22 0807 10/14/22 0312 10/15/22 0219  WBC 19.1* 16.8* 16.9* 20.7*   Microbiology No results found for this or any previous visit (from the past 240 hour(s)).   Time coordinating discharge:  I have spent 35 minutes face to face with the patient and on the ward discussing the patients care, assessment, plan and disposition with other care givers. >50% of the time was devoted counseling the patient about the risks and benefits of treatment/Discharge disposition and coordinating care.   SIGNED:   Dimple Nanas, MD  [facility] 10/15/2022, 10:20 AM   If 7PM-7AM, please contact night-coverage

## 2022-10-16 ENCOUNTER — Telehealth: Payer: Self-pay

## 2022-10-16 NOTE — Transitions of Care (Post Inpatient/ED Visit) (Signed)
   10/16/2022  Name: Kim [name] MRN: [id number] DOB: 1956/02/25  Today's TOC FU Call Status: Today's TOC FU Call Status:: Unsuccessul Call (1st Attempt) Unsuccessful Call (1st Attempt) Date: 10/16/22  Attempted to reach the patient regarding the most recent Inpatient/ED visit.  Follow Up Plan: Additional outreach attempts will be made to reach the patient to complete the Transitions of Care (Post Inpatient/ED visit) call.      Antionette Fairy, RN,BSN,CCM [facility] Care Management Care Management Community Coordinator Direct Phone: [phone] Toll Free: [phone] Fax: [phone]

## 2022-10-17 ENCOUNTER — Telehealth: Payer: Self-pay

## 2022-10-17 NOTE — Transitions of Care (Post Inpatient/ED Visit) (Signed)
10/17/2022  Name: Kim [name] MRN: [id number] DOB: June 11, 1955  Today's TOC FU Call Status: Today's TOC FU Call Status:: Successful TOC FU Call Competed TOC FU Call Complete Date: 10/17/22  Transition Care Management Follow-up Telephone Call Date of Discharge: 10/15/22 Discharge Facility: [facility]) Type of Discharge: Inpatient Admission Primary Inpatient Discharge Diagnosis:: "COPD exacerbation" How have you been since you were released from the hospital?: Same (Pt states she continue to have 'SOB with exertion-just finished using bathroom and it wears her out." Using oxygen at 2-3.5L/min via [place] cont. She just finished breathing txs which does help and taking other resp meds as ordered. Pulse ox 97% at present.) Any questions or concerns?: No  Items Reviewed: Did you receive and understand the discharge instructions provided?: Yes Medications obtained,verified, and reconciled?: Partial Review Completed Reason for Partial Mediation Review: pt unable to tolerte talking at length as it wears her out-increases SOB-reviewed with pt med changes and resp sx mgmt meds Any new allergies since your discharge?: No Dietary orders reviewed?: Yes Type of Diet Ordered:: low salt/heart healthy/carb modified Do you have support at home?: Yes People in Home: spouse Name of Support/Comfort Primary Source: [place] Distance  Medications Reviewed Today: Medications Reviewed Today     Reviewed by Charlyn Minerva, RN (Registered Nurse) on 10/17/22 at 1519  Med List Status: <None>   Medication Order Taking? Sig Documenting Provider Last Dose Status Informant  albuterol (VENTOLIN HFA) 108 (90 Base) MCG/ACT inhaler [id number] Yes INHALE 2 PUFFS INTO THE LUNGS EVERY 4 TO 6 HOURS AS NEEDED FOR SHORTNESS OF BREATH OR WHEEZING  Patient taking differently: Inhale 2 puffs into the lungs See admin instructions. Inhale 2 puffs every 4-6 hours as needed for wheezing or shortness or breath.   Noemi Chapel, NP Taking Active Self, Pharmacy Records  albuterol (VENTOLIN HFA) 108 (90 Base) MCG/ACT inhaler [id number] Yes Inhale 2 puffs into the lungs every 6 (six) hours as needed for wheezing or shortness of breath. Dimple Nanas, MD Taking Active   ALPRAZolam Prudy Feeler) 1 MG tablet [id number] Yes Take 1 tablet (1 mg total) by mouth every 6 (six) hours. Dimple Nanas, MD Taking Active   aspirin EC 81 MG EC tablet [id number]  Take 1 tablet (81 mg total) by mouth daily. Rhetta Mura, MD  Active Self, Pharmacy Records           Med Note [place], [place]   ZDG Oct 04, 2022  2:49 PM)    bisoprolol (ZEBETA) 5 MG tablet [id number]  Take 0.5 tablets (2.5 mg total) by mouth daily as needed (Take HALF tablet by mouth for break through chest pain as needed).  Patient taking differently: Take 2.5 mg by mouth daily.   Kathleene Hazel, MD  Active Self, Pharmacy Records  budesonide-formoterol [facility]) 160-4.5 MCG/ACT inhaler [id number] Yes Inhale 2 puffs into the lungs 2 (two) times daily. Dimple Nanas, MD Taking Active   butalbital-acetaminophen-caffeine (FIORICET) 50-325-40 MG tablet [id number]  Take 1 tablet by mouth every 4 (four) hours as needed for headache. TAKE 1 TABLET BY MOUTH EVERY 4 HOURS AS NEEDED FOR HEADACHETAKE 1 TABLET BY MOUTH EVERY 4 HOURS AS NEEDED FOR HEADACHE Nafziger, Kandee Keen, NP  Active Self, Pharmacy Records  Carboxymethylcellulose Sodium (ARTIFICIAL TEARS OP) [id number]  Place 1 drop into both eyes 2 (two) times daily as needed (dryness, irritation). [provider]  Active Self, Pharmacy Records  cetirizine (ZYRTEC) 10 MG tablet [id number]  Take 10 mg by mouth daily.  [provider]  Active Self, Pharmacy Records  Cholecalciferol (VITAMIN D3 PO) [id number]  Take 5,000 Units by mouth daily. [provider]  Active Self, Pharmacy Records  Cyanocobalamin (VITAMIN B-12 PO) [id number]  Take 1 tablet by mouth daily. [provider]   Active Self, Pharmacy Records  ezetimibe (ZETIA) 10 MG tablet [id number]  TAKE 1 TABLET(10 MG) BY MOUTH DAILY  Patient taking differently: Take 10 mg by mouth daily.   Nafziger, Kandee Keen, NP  Active Self, Pharmacy Records  furosemide (LASIX) 20 MG tablet [id number]  Take 1 tablet (20 mg total) by mouth daily. Dimple Nanas, MD  Active   furosemide (LASIX) 40 MG tablet [id number]  Take 1 tablet (40 mg total) by mouth daily.  Patient taking differently: Take 40 mg by mouth daily as needed for fluid.   Kathleene Hazel, MD  Active Self, Pharmacy Records  HYDROcodone-acetaminophen [facility]) 7.5-325 MG tablet [id number]  Take 1 tablet by mouth every 6 (six) hours as needed for moderate pain. Nafziger, Kandee Keen, NP  Active Self, Pharmacy Records  HYDROmorphone (DILAUDID) 2 MG tablet [id number]  Take 1 tablet (2 mg total) by mouth every 6 (six) hours as needed for severe pain (dyspnea). Amin, Loura Halt, MD  Active   ipratropium-albuterol (DUONEB) 0.5-2.5 (3) MG/3ML SOLN [id number] Yes Take 3 mLs by nebulization 2 (two) times daily, AND 3 ML via nebulizer every 6  hours as needed. Amin, Loura Halt, MD Taking Active   ipratropium-albuterol (DUONEB) 0.5-2.5 (3) MG/3ML SOLN [id number] Yes Take 3 mLs by nebulization every 4 (four) hours as needed. Dimple Nanas, MD Taking Active   magnesium oxide (MAG-OX) 400 MG tablet [id number]  Take 400 mg by mouth daily as needed (constipation). [provider]  Active Self, Pharmacy Records  [facility] MAXIMUM STRENGTH 1200 MG TB12 [id number]  Take 1,200 mg by mouth every 12 (twelve) hours. [provider]  Active Self, Pharmacy Records  nitroGLYCERIN (NITROSTAT) 0.4 MG SL tablet [id number]  Place 1 tablet (0.4 mg total) under the tongue every 5 (five) minutes as needed for chest pain. Kathleene Hazel, MD  Active Self, Pharmacy Records  nystatin (MYCOSTATIN) 100000 UNIT/ML suspension [id number]  Take 5 mLs (500,000 Units total) by mouth 4 (four) times  daily. Nafziger, Kandee Keen, NP  Active Self, Pharmacy Records  ondansetron [facility]) 4 MG tablet [id number]  Take 1 tablet (4 mg total) by mouth every 8 (eight) hours as needed for nausea or vomiting. Shirline Frees, NP  Active Self, Pharmacy Records  OXYGEN [id number]  Inhale 2-3 L/min into the lungs continuous. [provider]  Active Self, Pharmacy Records  PARoxetine (PAXIL) 40 MG tablet [id number]  TAKE 1 TABLET(40 MG) BY MOUTH EVERY MORNING  Patient taking differently: Take 40 mg by mouth daily.   Nafziger, Kandee Keen, NP  Active Self, Pharmacy Records  polyethylene glycol powder (GLYCOLAX/MIRALAX) 17 GM/SCOOP powder [id number]  Take 17 g by mouth daily as needed for moderate constipation (constipation). [provider]  Active Self, Pharmacy Records  predniSONE (DELTASONE) 10 MG tablet [id number] Yes Take 4 tablets (40 mg total) by mouth daily with breakfast for 3 days, THEN 3 tablets (30 mg total) daily with breakfast for 3 days, THEN 2 tablets (20 mg total) daily with breakfast for 3 days, THEN 1 tablet (10 mg total) daily with breakfast for 21 days. Dimple Nanas, MD Taking Active   rosuvastatin (CRESTOR) 20 MG tablet [id number]  Take 1 tablet (20 mg total) by mouth daily. Kathleene Hazel, MD  Active Self, Pharmacy Records  spironolactone (ALDACTONE) 25 MG tablet [id number]  Take 0.5 tablets (12.5 mg total) by mouth daily. Arrien, York Ram, MD  Active Self, Pharmacy Records  Med List Note Margo Aye, Mariposa D, CPhT 09/17/21 4098): Also uses mail order            Home Care and Equipment/Supplies: Were Home Health Services Ordered?: Yes Name of Home Health Agency:: [facility] Has Agency set up a time to come to your home?: No (Confirmed pt has contact info on paperwork-unsure if [facility] has tried to reach her as she has several missed calls/messages-will have spouse follow up with them) Any new equipment or medical supplies ordered?: No  Functional Questionnaire: Do you need  assistance with bathing/showering or dressing?: Yes Do you need assistance with meal preparation?: Yes Do you need assistance with eating?: No Do you have difficulty maintaining continence: No Do you need assistance with getting out of bed/getting out of a chair/moving?: Yes Do you have difficulty managing or taking your medications?: No  Follow up appointments reviewed: PCP Follow-up appointment confirmed?: Yes Date of PCP follow-up appointment?: 10/21/22 Follow-up Provider: [facility] Follow-up appointment confirmed?: Yes Date of Specialist follow-up appointment?: 11/06/22 Follow-Up Specialty Provider:: Dr. Dustin Flock MD) Do you need transportation to your follow-up appointment?: No (spouse able to take pt to appts) Do you understand care options if your condition(s) worsen?: Yes-patient verbalized understanding  SDOH Interventions Today    Flowsheet Row Most Recent Value  SDOH Interventions   Food Insecurity Interventions Intervention Not Indicated  Transportation Interventions Intervention Not Indicated      TOC Interventions Today    Flowsheet Row Most Recent Value  TOC Interventions   TOC Interventions Discussed/Reviewed TOC Interventions Discussed      Interventions Today    Flowsheet Row Most Recent Value  Chronic Disease   Chronic disease during today's visit Chronic Obstructive Pulmonary Disease (COPD)  General Interventions   General Interventions Discussed/Reviewed General Interventions Discussed, Durable Medical Equipment (DME), Referral to Nurse, Communication with  [pt agreeable to RN calling to assist with dx mgmt & ongoing support/education-follow up appt scheduled with pt-pt confirmed date/time]  Doctor Visits Discussed/Reviewed Doctor Visits Discussed, PCP, Specialist  Durable Medical Equipment (DME) Oxygen, Other  [pulse ox]  PCP/Specialist Visits Compliance with follow-up visit  Communication with RN  Education Interventions    Education Provided Provided Education  Provided Verbal Education On Nutrition, Medication, When to see the doctor, Other  [resp sx mgmt]  Nutrition Interventions   Nutrition Discussed/Reviewed Nutrition Discussed  Pharmacy Interventions   Pharmacy Dicussed/Reviewed Pharmacy Topics Discussed, Medications and their functions  Safety Interventions   Safety Discussed/Reviewed Safety Discussed  Advanced Directive Interventions   Advanced Directives Discussed/Reviewed End of Life  End of Life Palliative  [reviewed with pt palliative care services role and how they can assist pt with managing resp sxs in the home-she is unsure if they have called-has several missed calls/messages-will have spouse call them to schedule home visit]       Antionette Fairy, [facility] Care Management Care Management Community Coordinator Direct Phone: [phone] Toll Free: [phone] Fax: [phone]

## 2022-10-17 NOTE — Transitions of Care (Post Inpatient/ED Visit) (Signed)
   10/17/2022  Name: Kim [name] MRN: [id number] DOB: 26-Dec-1955  Today's TOC FU Call Status: Today's TOC FU Call Status:: Unsuccessful Call (2nd Attempt) Unsuccessful Call (2nd Attempt) Date: 10/17/22  Attempted to reach the patient regarding the most recent Inpatient/ED visit.  Follow Up Plan: Additional outreach attempts will be made to reach the patient to complete the Transitions of Care (Post Inpatient/ED visit) call.      Antionette Fairy, RN,BSN,CCM [facility] Care Management Care Management Community Coordinator Direct Phone: [phone] Toll Free: [phone] Fax: [phone]

## 2022-10-20 ENCOUNTER — Telehealth: Payer: Self-pay

## 2022-10-20 NOTE — Telephone Encounter (Signed)
(  11:57 am) PC LCSW scheduled an initial [facility] palliative care visit for patient with her husband. Visit is scheduled for 10/28/22 @ 11:30 am with palliative care nurse-Dee.

## 2022-10-21 ENCOUNTER — Inpatient Hospital Stay: Payer: Medicare Other | Admitting: Adult Health

## 2022-10-21 ENCOUNTER — Telehealth: Payer: Self-pay | Admitting: Adult Health

## 2022-10-21 NOTE — Telephone Encounter (Signed)
Eileen Stanford from [facility] called and stated that Kim [name] is requesting a delay of HH services until tomorrow 10/21/2024 since being discharged from [facility].  Eileen Stanford stated she was just able to get in touch with Kim [name] today, and wanted you to be aware that patient has requested this further delay.

## 2022-10-22 ENCOUNTER — Ambulatory Visit: Payer: Self-pay

## 2022-10-22 NOTE — Patient Outreach (Signed)
  Care Coordination   Initial Visit Note   10/22/2022 Name: Kim [name] MRN: [id number] DOB: 1955/09/06  Kim [name] is a 67 y.o. year old female who sees Nafziger, Kandee Keen, NP for primary care. I spoke with  Berton Mount by phone today.  What matters to the patients health and wellness today?  Getting help in the home    Goals Addressed             This Visit's Progress    COMPLETED: Care Coordination Activities- No follow up required       Care Coordination Interventions: Evaluation of current treatment plan related to COPD and patient's adherence to plan as established by provider  Active listening / Reflection utilized   Spoke with patient. She states she is doing okay.  She thought that CM was outside her home to see her.  Advised patient that my appointment was telephonic only. Patient states there are too many irons in the fire when it comes to her care and she is confused. [facility] scheduled to come out today and Palliative consult also pending. Patient requests that if CM not coming to the home to help her, services are not needed.  Advised patient that RN CM will close case at this time. She is appreciative and agreeable.  Advised to call CM if any further needs.         SDOH assessments and interventions completed:  No     Care Coordination Interventions:  Yes, provided   Follow up plan: No further intervention required.   Encounter Outcome:  Pt. Visit Completed   Bary Leriche, RN, MSN [facility] Care Management Care Management Coordinator Direct Line [phone]

## 2022-10-22 NOTE — Patient Instructions (Signed)
Visit Information  Thank you for taking time to visit with me today. Please don't hesitate to contact me if I can be of assistance to you.   Following are the goals we discussed today:   Goals Addressed             This Visit's Progress    COMPLETED: Care Coordination Activities- No follow up required       Care Coordination Interventions: Evaluation of current treatment plan related to COPD and patient's adherence to plan as established by provider  Active listening / Reflection utilized   Spoke with patient. She states she is doing okay.  She thought that CM was outside her home to see her.  Advised patient that my appointment was telephonic only. Patient states there are too many irons in the fire when it comes to her care and she is confused. [facility] scheduled to come out today and Palliative consult also pending. Patient requests that if CM not coming to the home to help her, services are not needed.  Advised patient that RN CM will close case at this time. She is appreciative and agreeable.  Advised to call CM if any further needs.         If you are experiencing a Mental Health or Behavioral Health Crisis or need someone to talk to, please call the Suicide and Crisis Lifeline: 988   Patient verbalizes understanding of instructions and care plan provided today and agrees to view in MyChart. Active MyChart status and patient understanding of how to access instructions and care plan via MyChart confirmed with patient.     The patient has been provided with contact information for the care management team and has been advised to call with any health related questions or concerns.   Bary Leriche, RN, MSN [facility] Care Management Care Management Coordinator Direct Line [phone]

## 2022-10-23 ENCOUNTER — Telehealth: Payer: Self-pay | Admitting: Adult Health

## 2022-10-23 NOTE — Telephone Encounter (Addendum)
Kim [name] - PT with Frances Furbish [phone] Rip Harbour to leave a detailed message on this line  Verbal Order - PT 1 x a wk for 5 wks 1 x every other week for 4 wks  Elon Jester advised that NP is OOO until next week  Verbal Order - Home Health Aide Requesting approval for Home Health Aide // Once a wk for 5 wks  Inquiry: Pt was supposed to have labs done at [facility]  Did those results come in?  Please advise.

## 2022-10-24 NOTE — Telephone Encounter (Signed)
Verbal orders given to

## 2022-10-28 ENCOUNTER — Telehealth: Payer: Self-pay | Admitting: Adult Health

## 2022-10-28 NOTE — Telephone Encounter (Signed)
Requesting an order for a tub transfer bench to assist with showering.

## 2022-10-29 ENCOUNTER — Telehealth: Payer: Self-pay | Admitting: Adult Health

## 2022-10-29 NOTE — Telephone Encounter (Signed)
Please advise 

## 2022-10-29 NOTE — Telephone Encounter (Signed)
Error verbal orders given to Kim [name]'s nursing manager in her absence.

## 2022-10-29 NOTE — Telephone Encounter (Signed)
Requesting order for home health nurse for blood work, BMP, asks that you call [facility] at [phone] with the order. Also asking that there be an order for PM blood work added since patient cannot make it into the office (says she is physically unable)

## 2022-10-29 NOTE — Telephone Encounter (Signed)
Verbal orders given to  [facility] with Frances Furbish

## 2022-10-30 ENCOUNTER — Other Ambulatory Visit: Payer: Self-pay | Admitting: Adult Health

## 2022-10-30 DIAGNOSIS — R2681 Unsteadiness on feet: Secondary | ICD-10-CM

## 2022-10-30 NOTE — Telephone Encounter (Signed)
Spoke to [place] and she stated to send the order to [facility] or [employer].

## 2022-10-30 NOTE — Telephone Encounter (Signed)
Orders faxed to [facility] with confirmation.

## 2022-10-31 ENCOUNTER — Inpatient Hospital Stay: Payer: Medicare Other | Admitting: Adult Health

## 2022-11-04 ENCOUNTER — Encounter: Payer: Self-pay | Admitting: Adult Health

## 2022-11-04 ENCOUNTER — Telehealth (INDEPENDENT_AMBULATORY_CARE_PROVIDER_SITE_OTHER): Payer: Medicare Other | Admitting: Adult Health

## 2022-11-04 VITALS — BP 136/72 | HR 78 | Ht 61.0 in | Wt 138.4 lb

## 2022-11-04 DIAGNOSIS — G8929 Other chronic pain: Secondary | ICD-10-CM

## 2022-11-04 DIAGNOSIS — B370 Candidal stomatitis: Secondary | ICD-10-CM | POA: Diagnosis not present

## 2022-11-04 DIAGNOSIS — M159 Polyosteoarthritis, unspecified: Secondary | ICD-10-CM | POA: Diagnosis not present

## 2022-11-04 LAB — LAB REPORT - SCANNED: EGFR: 57

## 2022-11-04 MED ORDER — HYDROCODONE-ACETAMINOPHEN 7.5-325 MG PO TABS
1.0000 | ORAL_TABLET | Freq: Four times a day (QID) | ORAL | 0 refills | Status: DC | PRN
Start: 2022-11-04 — End: 2023-02-20

## 2022-11-04 MED ORDER — NYSTATIN 100000 UNIT/ML MT SUSP
5.0000 mL | Freq: Four times a day (QID) | OROMUCOSAL | 2 refills | Status: AC
Start: 2022-11-04 — End: ?

## 2022-11-04 MED ORDER — HYDROCODONE-ACETAMINOPHEN 7.5-325 MG PO TABS
1.0000 | ORAL_TABLET | Freq: Four times a day (QID) | ORAL | 0 refills | Status: AC | PRN
Start: 2022-11-04 — End: ?

## 2022-11-04 NOTE — Progress Notes (Signed)
Virtual Visit via Video Note  I connected with Kim [name]  on 11/04/22 at  1:00 PM EDT by a video enabled telemedicine application and verified that I am speaking with the correct person using two identifiers.  Location patient: home Location provider:work or home office Persons participating in the virtual visit: patient, provider  I discussed the limitations of evaluation and management by telemedicine and the availability of in person appointments. The patient expressed understanding and agreed to proceed.   HPI: She is being evaluated today for chronic pain management.  She is unable to come into the office today d/t recent hospital admission for COPD exacerbation. She is unable to get down the stairs in her home and the heat and humidity affect her to negatively.   NCCSRS reviewed in EPIC   Indication for chronic opioid: Osteoarthritis involving multiple joints  Medication and dose: Norco 7.5-325 mg Q6 H PRN  # pills per month: 45 Last UDS date: 01/29/2022 Opioid Treatment Agreement signed: 01/29/2022 Opioid Treatment Agreement last reviewed with patient: Today  NCCSRS reviewed this encounter (include red flags):  Today   She also needs a refill of Nystatin oral solution for thrush d/t chronic steroid and inhaler use.    ROS: See pertinent positives and negatives per HPI.  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   OSTEOARTHRITIS 06/05/2009   PARESTHESIA 08/24/2007   TOBACCO ABUSE 01/25/2010    Past Surgical History:  Procedure Laterality Date   ABDOMINAL HYSTERECTOMY     APPENDECTOMY  1969   CARDIAC CATHETERIZATION N/A 07/05/2015   Procedure: Left Heart Cath and Coronary Angiography;  Surgeon: Marykay Lex, MD;  Location: [facility] INVASIVE CV LAB;  Service: Cardiovascular;  Laterality: N/A;   CHOLECYSTECTOMY  1989    collapse lung  1990   COLONOSCOPY WITH PROPOFOL N/A 05/14/2018   Procedure: COLONOSCOPY WITH PROPOFOL;  Surgeon: Hilarie Fredrickson, MD;  Location: [facility] ENDOSCOPY;  Service: Endoscopy;  Laterality: N/A;   EXCISION MASS ABDOMINAL  2019   EXPLORATORY LAPAROTOMY  1969   LEFT HEART CATH AND CORONARY ANGIOGRAPHY N/A 11/19/2020   Procedure: LEFT HEART CATH AND CORONARY ANGIOGRAPHY;  Surgeon: Yvonne Kendall, MD;  Location: [facility] INVASIVE CV LAB;  Service: Cardiovascular;  Laterality: N/A;   POLYPECTOMY  05/14/2018   Procedure: POLYPECTOMY;  Surgeon: Hilarie Fredrickson, MD;  Location: [facility]OS[facility];  Service: Endoscopy;;   SALPINGOOPHORECTOMY  2019    Family History  Problem Relation Age of Onset   Cancer Mother        lung   Heart attack Mother    Hypertension Sister    Multiple sclerosis Brother    Diabetes Sister    Rheum arthritis Sister    Thyroid disease Sister    Multiple sclerosis Other    Alcohol abuse Other    Arthritis Other    Diabetes Other    Kidney disease Other    Cancer Other        lung,ovarian,skin, uterine   Stroke Other    Heart disease Other    Melanoma Other    Osteoporosis Other    Heart attack Maternal Uncle    Heart attack Other        NEICE   Heart attack Other        NEPHEW   Hypertension Brother    Stroke Maternal Grandmother  Colon cancer Neg Hx        Current Outpatient Medications:    albuterol (VENTOLIN HFA) 108 (90 Base) MCG/ACT inhaler, INHALE 2 PUFFS INTO THE LUNGS EVERY 4 TO 6 HOURS AS NEEDED FOR SHORTNESS OF BREATH OR WHEEZING (Patient taking differently: Inhale 2 puffs into the lungs See admin instructions. Inhale 2 puffs every 4-6 hours as needed for wheezing or shortness or breath.), Disp: 18 g, Rfl: 3   albuterol (VENTOLIN HFA) 108 (90 Base) MCG/ACT inhaler, Inhale 2 puffs into the lungs every 6 (six) hours as needed for wheezing or shortness of breath., Disp: 18 g, Rfl: 0   ALPRAZolam (XANAX) 1 MG tablet, Take 1 tablet (1 mg total) by mouth every 6  (six) hours., Disp: 15 tablet, Rfl: 0   aspirin EC 81 MG EC tablet, Take 1 tablet (81 mg total) by mouth daily., Disp: , Rfl:    bisoprolol (ZEBETA) 5 MG tablet, Take 0.5 tablets (2.5 mg total) by mouth daily as needed (Take HALF tablet by mouth for break through chest pain as needed). (Patient taking differently: Take 2.5 mg by mouth daily.), Disp: 45 tablet, Rfl: 2   budesonide-formoterol (SYMBICORT) 160-4.5 MCG/ACT inhaler, Inhale 2 puffs into the lungs 2 (two) times daily., Disp: 10.2 g, Rfl: 0   butalbital-acetaminophen-caffeine (FIORICET) 50-325-40 MG tablet, Take 1 tablet by mouth every 4 (four) hours as needed for headache. TAKE 1 TABLET BY MOUTH EVERY 4 HOURS AS NEEDED FOR HEADACHETAKE 1 TABLET BY MOUTH EVERY 4 HOURS AS NEEDED FOR HEADACHE, Disp: 14 tablet, Rfl: 3   Carboxymethylcellulose Sodium (ARTIFICIAL TEARS OP), Place 1 drop into both eyes 2 (two) times daily as needed (dryness, irritation)., Disp: , Rfl:    cetirizine (ZYRTEC) 10 MG tablet, Take 10 mg by mouth daily., Disp: , Rfl:    Cholecalciferol (VITAMIN D3 PO), Take 5,000 Units by mouth daily., Disp: , Rfl:    Cyanocobalamin (VITAMIN B-12 PO), Take 1 tablet by mouth daily., Disp: , Rfl:    ezetimibe (ZETIA) 10 MG tablet, TAKE 1 TABLET(10 MG) BY MOUTH DAILY (Patient taking differently: Take 10 mg by mouth daily.), Disp: 90 tablet, Rfl: 1   furosemide (LASIX) 20 MG tablet, Take 1 tablet (20 mg total) by mouth daily., Disp: 30 tablet, Rfl: 0   furosemide (LASIX) 40 MG tablet, Take 1 tablet (40 mg total) by mouth daily. (Patient taking differently: Take 40 mg by mouth daily as needed for fluid.), Disp: 90 tablet, Rfl: 3   HYDROcodone-acetaminophen (NORCO) 7.5-325 MG tablet, Take 1 tablet by mouth every 6 (six) hours as needed for moderate pain., Disp: 30 tablet, Rfl: 0   ipratropium-albuterol (DUONEB) 0.5-2.5 (3) MG/3ML SOLN, Take 3 mLs by nebulization 2 (two) times daily, AND 3 ML via nebulizer every 6  hours as needed., Disp: 180 mL,  Rfl: 0   ipratropium-albuterol (DUONEB) 0.5-2.5 (3) MG/3ML SOLN, Take 3 mLs by nebulization every 4 (four) hours as needed., Disp: 180 mL, Rfl: 0   magnesium oxide (MAG-OX) 400 MG tablet, Take 400 mg by mouth daily as needed (constipation)., Disp: , Rfl:    MUCINEX MAXIMUM STRENGTH 1200 MG TB12, Take 1,200 mg by mouth every 12 (twelve) hours., Disp: , Rfl:    nitroGLYCERIN (NITROSTAT) 0.4 MG SL tablet, Place 1 tablet (0.4 mg total) under the tongue every 5 (five) minutes as needed for chest pain., Disp: 25 tablet, Rfl: 3   ondansetron (ZOFRAN) 4 MG tablet, Take 1 tablet (4 mg total) by mouth every 8 (eight)  hours as needed for nausea or vomiting., Disp: 20 tablet, Rfl: 0   OXYGEN, Inhale 2-3 L/min into the lungs continuous., Disp: , Rfl:    PARoxetine (PAXIL) 40 MG tablet, TAKE 1 TABLET(40 MG) BY MOUTH EVERY MORNING (Patient taking differently: Take 40 mg by mouth daily.), Disp: 90 tablet, Rfl: 1   polyethylene glycol powder (GLYCOLAX/MIRALAX) 17 GM/SCOOP powder, Take 17 g by mouth daily as needed for moderate constipation (constipation)., Disp: , Rfl:    predniSONE (DELTASONE) 10 MG tablet, Take 4 tablets (40 mg total) by mouth daily with breakfast for 3 days, THEN 3 tablets (30 mg total) daily with breakfast for 3 days, THEN 2 tablets (20 mg total) daily with breakfast for 3 days, THEN 1 tablet (10 mg total) daily with breakfast for 21 days., Disp: 48 tablet, Rfl: 0   rosuvastatin (CRESTOR) 20 MG tablet, Take 1 tablet (20 mg total) by mouth daily., Disp: 90 tablet, Rfl: 3   spironolactone (ALDACTONE) 25 MG tablet, Take 0.5 tablets (12.5 mg total) by mouth daily., Disp: 15 tablet, Rfl: 0  EXAM:  VITALS per patient if applicable:  GENERAL: alert, oriented, appears well and in no acute distress  HEENT: atraumatic, conjunttiva clear, no obvious abnormalities on inspection of external nose and ears  NECK: normal movements of the head and neck  LUNGS: on inspection no signs of respiratory  distress, breathing rate appears normal, no obvious gross SOB, gasping or wheezing  CV: no obvious cyanosis  MS: moves all visible extremities without noticeable abnormality  PSYCH/NEURO: pleasant and cooperative, no obvious depression or anxiety, speech and thought processing grossly intact  ASSESSMENT AND PLAN:  Discussed the following assessment and plan:  1. Encounter for chronic pain management - Follow up in 3 months or sooner if needed  - HYDROcodone-acetaminophen (NORCO) 7.5-325 MG tablet; Take 1 tablet by mouth every 6 (six) hours as needed for moderate pain.  Dispense: 30 tablet; Refill: 0 - HYDROcodone-acetaminophen (NORCO) 7.5-325 MG tablet; Take 1 tablet by mouth every 6 (six) hours as needed for moderate pain.  Dispense: 30 tablet; Refill: 0 - HYDROcodone-acetaminophen (NORCO) 7.5-325 MG tablet; Take 1 tablet by mouth every 6 (six) hours as needed for moderate pain.  Dispense: 30 tablet; Refill: 0  2. Osteoarthritis of multiple joints, unspecified osteoarthritis type  - HYDROcodone-acetaminophen (NORCO) 7.5-325 MG tablet; Take 1 tablet by mouth every 6 (six) hours as needed for moderate pain.  Dispense: 30 tablet; Refill: 0 - HYDROcodone-acetaminophen (NORCO) 7.5-325 MG tablet; Take 1 tablet by mouth every 6 (six) hours as needed for moderate pain.  Dispense: 30 tablet; Refill: 0 - HYDROcodone-acetaminophen (NORCO) 7.5-325 MG tablet; Take 1 tablet by mouth every 6 (six) hours as needed for moderate pain.  Dispense: 30 tablet; Refill: 0  3. Thrush  - nystatin (MYCOSTATIN) 100000 UNIT/ML suspension; Take 5 mLs (500,000 Units total) by mouth 4 (four) times daily.  Dispense: 473 mL; Refill: 2  I discussed the assessment and treatment plan with the patient. The patient was provided an opportunity to ask questions and all were answered. The patient agreed with the plan and demonstrated an understanding of the instructions.   The patient was advised to call back or seek an  in-person evaluation if the symptoms worsen or if the condition fails to improve as anticipated.   Shirline Frees, NP   Time spent with patient today was 31 minutes which consisted of chart review, discussing chronic pain management and thrush, work up, treatment answering questions and documentation.

## 2022-11-04 NOTE — Progress Notes (Deleted)
Subjective:    Patient ID: Kim [name], female    DOB: 06/06/1955, 67 y.o.   MRN: [id number]  HPI  67 year old female who  has a past medical history of C8 RADICULOPATHY (06/05/2009), COPD (08/31/2007), DEPRESSION (11/19/2006), DYSLIPIDEMIA (03/12/2009), MI (mitral incompetence), NEPHROLITHIASIS (01/05/2008), OSTEOARTHRITIS (06/05/2009), PARESTHESIA (08/24/2007), and TOBACCO ABUSE (01/25/2010).  She presents to the office today for chronic pain management visit   NCCSRS reviewed in EPIC   Indication for chronic opioid: Osteoarthritis involving multiple joints  Medication and dose: Norco 7.5-325 mg Q6 H PRN  # pills per month: 45 Last UDS date: 01/29/2022 Opioid Treatment Agreement signed: 01/29/2022 Opioid Treatment Agreement last reviewed with patient: Today  NCCSRS reviewed this encounter (include red flags):  Today   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   OSTEOARTHRITIS 06/05/2009   PARESTHESIA 08/24/2007   TOBACCO ABUSE 01/25/2010    Social History   Socioeconomic History   Marital status: Married    Spouse name: Thereasa Distance   Number of children: 2   Years of education: Not on file   Highest education level: Not on file  Occupational History   Occupation: Patient care aide-sits with elderly lady  Tobacco Use   Smoking status: Former    Current packs/day: 0.00    Average packs/day: 2.0 packs/day for 38.0 years (76.0 ttl pk-yrs)    Types: Cigarettes    Start date: 03/07/1976    Quit date: 03/07/2014    Years since quitting: 8.6   Smokeless tobacco: Never  Vaping Use   Vaping status: Never Used  Substance and Sexual Activity   Alcohol use: No    Alcohol/week: 0.0 standard drinks of alcohol   Drug use: No   Sexual activity: Not on file  Other Topics Concern   Not on file  Social History  Narrative   [facility] (01/12/17):   Originally from [facility]. Has lived in [place] for 17 years. Married. Has 2 dogs currently. No mold and only 1 indoor plant. Had her home professionally cleaned a month ago. No bird or hot tub exposure. 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    Right handed    Social Determinants of Health   Financial Resource Strain: High Risk (09/10/2021)   Overall Financial Resource Strain (CARDIA)    Difficulty of Paying Living Expenses: Very hard  Food Insecurity: No Food Insecurity (10/17/2022)   Hunger Vital Sign    Worried About Running Out of Food in the Last Year: Never true    Ran Out of Food in the Last Year: Never true  Transportation Needs: No Transportation Needs (10/17/2022)   PRAPARE - Administrator, Civil Service (Medical): No    Lack of Transportation (Non-Medical): No  Physical Activity: Inactive (03/25/2021)   Exercise Vital Sign    Days of Exercise per Week: 0 days    Minutes of Exercise per Session: 0 min  Stress: No Stress Concern Present (09/10/2021)   [employer] of Occupational Health - Occupational Stress Questionnaire    Feeling of Stress : Not at all  Social Connections: Moderately Isolated (03/25/2021)   Social Connection and Isolation Panel [NHANES]    Frequency of Communication with Friends and Family: More than three times a week  Frequency of Social Gatherings with Friends and Family: More than three times a week    Attends Religious Services: Never    Database administrator or Organizations: No    Attends Banker Meetings: Never    Marital Status: Married  Catering manager Violence: Not At Risk (10/09/2022)   Humiliation, Afraid, Rape, and Kick questionnaire    Fear of Current or Ex-Partner: No    Emotionally Abused: No    Physically Abused: No    Sexually Abused: No    Past Surgical History:  Procedure Laterality Date   ABDOMINAL HYSTERECTOMY     APPENDECTOMY  1969    CARDIAC CATHETERIZATION N/A 07/05/2015   Procedure: Left Heart Cath and Coronary Angiography;  Surgeon: Marykay Lex, MD;  Location: [facility] INVASIVE CV LAB;  Service: Cardiovascular;  Laterality: N/A;   CHOLECYSTECTOMY  1989   collapse lung  1990   COLONOSCOPY WITH PROPOFOL N/A 05/14/2018   Procedure: COLONOSCOPY WITH PROPOFOL;  Surgeon: Hilarie Fredrickson, MD;  Location: [facility];  Service: Endoscopy;  Laterality: N/A;   EXCISION MASS ABDOMINAL  2019   EXPLORATORY LAPAROTOMY  1969   LEFT HEART CATH AND CORONARY ANGIOGRAPHY N/A 11/19/2020   Procedure: LEFT HEART CATH AND CORONARY ANGIOGRAPHY;  Surgeon: Yvonne Kendall, MD;  Location: [facility] INVASIVE CV LAB;  Service: Cardiovascular;  Laterality: N/A;   POLYPECTOMY  05/14/2018   Procedure: POLYPECTOMY;  Surgeon: Hilarie Fredrickson, MD;  Location: [facility];  Service: Endoscopy;;   SALPINGOOPHORECTOMY  2019    Family History  Problem Relation Age of Onset   Cancer Mother        lung   Heart attack Mother    Hypertension Sister    Multiple sclerosis Brother    Diabetes Sister    Rheum arthritis Sister    Thyroid disease Sister    Multiple sclerosis Other    Alcohol abuse Other    Arthritis Other    Diabetes Other    Kidney disease Other    Cancer Other        lung,ovarian,skin, uterine   Stroke Other    Heart disease Other    Melanoma Other    Osteoporosis Other    Heart attack Maternal Uncle    Heart attack Other        NEICE   Heart attack Other        NEPHEW   Hypertension Brother    Stroke Maternal Grandmother    Colon cancer Neg Hx     Allergies  Allergen Reactions   Tape Other (See Comments)    Skin tears easily, peels skin off   Daliresp [Roflumilast] Other (See Comments)    Skin peeling   Cozaar [Losartan] Other (See Comments)    Possible cause of severe hypotension   Dupixent [Dupilumab] Diarrhea, Swelling and Cough    Facial swelling Headache   Latex Other (See Comments)    Peels skin off   Morphine And Codeine  Other (See Comments)    Hallucinations    Penicillins Hives   Statins Other (See Comments)    Myalgia    Zestril [Lisinopril] Cough   Omnicef [Cefdinir] Rash    Current Outpatient Medications on File Prior to Visit  Medication Sig Dispense Refill   albuterol (VENTOLIN HFA) 108 (90 Base) MCG/ACT inhaler INHALE 2 PUFFS INTO THE LUNGS EVERY 4 TO 6 HOURS AS NEEDED FOR SHORTNESS OF BREATH OR WHEEZING (Patient taking differently: Inhale 2 puffs into the lungs See admin  instructions. Inhale 2 puffs every 4-6 hours as needed for wheezing or shortness or breath.) 18 g 3   albuterol (VENTOLIN HFA) 108 (90 Base) MCG/ACT inhaler Inhale 2 puffs into the lungs every 6 (six) hours as needed for wheezing or shortness of breath. 18 g 0   ALPRAZolam (XANAX) 1 MG tablet Take 1 tablet (1 mg total) by mouth every 6 (six) hours. 15 tablet 0   aspirin EC 81 MG EC tablet Take 1 tablet (81 mg total) by mouth daily.     bisoprolol (ZEBETA) 5 MG tablet Take 0.5 tablets (2.5 mg total) by mouth daily as needed (Take HALF tablet by mouth for break through chest pain as needed). (Patient taking differently: Take 2.5 mg by mouth daily.) 45 tablet 2   budesonide-formoterol (SYMBICORT) 160-4.5 MCG/ACT inhaler Inhale 2 puffs into the lungs 2 (two) times daily. 10.2 g 0   butalbital-acetaminophen-caffeine (FIORICET) 50-325-40 MG tablet Take 1 tablet by mouth every 4 (four) hours as needed for headache. TAKE 1 TABLET BY MOUTH EVERY 4 HOURS AS NEEDED FOR HEADACHETAKE 1 TABLET BY MOUTH EVERY 4 HOURS AS NEEDED FOR HEADACHE 14 tablet 3   Carboxymethylcellulose Sodium (ARTIFICIAL TEARS OP) Place 1 drop into both eyes 2 (two) times daily as needed (dryness, irritation).     cetirizine (ZYRTEC) 10 MG tablet Take 10 mg by mouth daily.     Cholecalciferol (VITAMIN D3 PO) Take 5,000 Units by mouth daily.     Cyanocobalamin (VITAMIN B-12 PO) Take 1 tablet by mouth daily.     ezetimibe (ZETIA) 10 MG tablet TAKE 1 TABLET(10 MG) BY MOUTH DAILY  (Patient taking differently: Take 10 mg by mouth daily.) 90 tablet 1   furosemide (LASIX) 20 MG tablet Take 1 tablet (20 mg total) by mouth daily. 30 tablet 0   furosemide (LASIX) 40 MG tablet Take 1 tablet (40 mg total) by mouth daily. (Patient taking differently: Take 40 mg by mouth daily as needed for fluid.) 90 tablet 3   HYDROcodone-acetaminophen (NORCO) 7.5-325 MG tablet Take 1 tablet by mouth every 6 (six) hours as needed for moderate pain. 30 tablet 0   HYDROmorphone (DILAUDID) 2 MG tablet Take 1 tablet (2 mg total) by mouth every 6 (six) hours as needed for severe pain (dyspnea). 15 tablet 0   ipratropium-albuterol (DUONEB) 0.5-2.5 (3) MG/3ML SOLN Take 3 mLs by nebulization 2 (two) times daily, AND 3 ML via nebulizer every 6  hours as needed. 180 mL 0   ipratropium-albuterol (DUONEB) 0.5-2.5 (3) MG/3ML SOLN Take 3 mLs by nebulization every 4 (four) hours as needed. 180 mL 0   magnesium oxide (MAG-OX) 400 MG tablet Take 400 mg by mouth daily as needed (constipation).     MUCINEX MAXIMUM STRENGTH 1200 MG TB12 Take 1,200 mg by mouth every 12 (twelve) hours.     nitroGLYCERIN (NITROSTAT) 0.4 MG SL tablet Place 1 tablet (0.4 mg total) under the tongue every 5 (five) minutes as needed for chest pain. 25 tablet 3   nystatin (MYCOSTATIN) 100000 UNIT/ML suspension Take 5 mLs (500,000 Units total) by mouth 4 (four) times daily. 60 mL 2   ondansetron (ZOFRAN) 4 MG tablet Take 1 tablet (4 mg total) by mouth every 8 (eight) hours as needed for nausea or vomiting. 20 tablet 0   OXYGEN Inhale 2-3 L/min into the lungs continuous.     PARoxetine (PAXIL) 40 MG tablet TAKE 1 TABLET(40 MG) BY MOUTH EVERY MORNING (Patient taking differently: Take 40 mg by mouth daily.)  90 tablet 1   polyethylene glycol powder (GLYCOLAX/MIRALAX) 17 GM/SCOOP powder Take 17 g by mouth daily as needed for moderate constipation (constipation).     predniSONE (DELTASONE) 10 MG tablet Take 4 tablets (40 mg total) by mouth daily with  breakfast for 3 days, THEN 3 tablets (30 mg total) daily with breakfast for 3 days, THEN 2 tablets (20 mg total) daily with breakfast for 3 days, THEN 1 tablet (10 mg total) daily with breakfast for 21 days. 48 tablet 0   rosuvastatin (CRESTOR) 20 MG tablet Take 1 tablet (20 mg total) by mouth daily. 90 tablet 3   spironolactone (ALDACTONE) 25 MG tablet Take 0.5 tablets (12.5 mg total) by mouth daily. 15 tablet 0   No current facility-administered medications on file prior to visit.    There were no vitals taken for this visit.      Objective:   Physical Exam Vitals and nursing note reviewed.  Constitutional:      Appearance: Normal appearance.  Cardiovascular:     Rate and Rhythm: Normal rate and regular rhythm.     Pulses: Normal pulses.     Heart sounds: Normal heart sounds.  Pulmonary:     Effort: Pulmonary effort is normal.     Breath sounds: Normal breath sounds.     Comments: O2 via [place] Musculoskeletal:        General: Normal range of motion.  Neurological:     General: No focal deficit present.     Mental Status: She is alert and oriented to person, place, and time.     Motor: Weakness present.     Gait: Gait abnormal (in wheelchair).  Psychiatric:        Mood and Affect: Mood normal.        Behavior: Behavior normal.        Thought Content: Thought content normal.        Judgment: Judgment normal.           Assessment & Plan:

## 2022-11-06 ENCOUNTER — Encounter (HOSPITAL_BASED_OUTPATIENT_CLINIC_OR_DEPARTMENT_OTHER): Payer: Self-pay | Admitting: Pulmonary Disease

## 2022-11-06 ENCOUNTER — Telehealth (INDEPENDENT_AMBULATORY_CARE_PROVIDER_SITE_OTHER): Payer: Medicare Other | Admitting: Pulmonary Disease

## 2022-11-06 DIAGNOSIS — J441 Chronic obstructive pulmonary disease with (acute) exacerbation: Secondary | ICD-10-CM | POA: Diagnosis not present

## 2022-11-06 DIAGNOSIS — J9611 Chronic respiratory failure with hypoxia: Secondary | ICD-10-CM

## 2022-11-06 DIAGNOSIS — I5022 Chronic systolic (congestive) heart failure: Secondary | ICD-10-CM

## 2022-11-06 DIAGNOSIS — J9612 Chronic respiratory failure with hypercapnia: Secondary | ICD-10-CM

## 2022-11-06 MED ORDER — DOXYCYCLINE HYCLATE 100 MG PO TABS: 100.0000 mg | ORAL_TABLET | Freq: Two times a day (BID) | ORAL | 0 refills | Status: DC

## 2022-11-06 NOTE — Progress Notes (Signed)
I connected with  Kim [name] on 11/06/22 by phone/  video enabled telemedicine application and verified that I am speaking with the correct person using two identifiers.     Location: Patient: Home Provider: Office -  Pulmonary - [facility], [place], [place] [address]   I discussed the limitations of evaluation and management by telemedicine and the availability of in person appointments. The patient expressed understanding and agreed to proceed. I also discussed with the patient that there may be a patient responsible charge related to this service. The patient expressed understanding and agreed to proceed.   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   Meds-stopped Daliresp due to skin peeling 01/2021 Dupixent caused facial edema and erythema   Had several hospitalizations since her last office visit in 07/2022. She was initially hospitalized 6/5 to 6/13 for COPD exacerbation and found to have a relapse of her stress cardiomyopathy with EF dropped to 35%.  She improved with diuresis and steroids and got readmitted for 3 days for more diuresis.  She was again readmitted 7/4 to 7/10 for COPD/CHF exacerbation with acute diuresis this time discharged with palliative care follow-up at home and home PT/OT. She has completed her prednisone taper and is back down to her baseline dose of 10 mg.  She still endorses. She denies pedal edema or cough     COPD exacerbations: Multiple since 2020, 2 hospitalizations in 2021 03/2020 hospitalized for 10 days for COPD exacerbation 09/2020 developed COVID infection, received monoclonal antibody 11/2020 adm for COPD exacerbation and stress-induced cardiomyopathy/acute systolic heart failure/type II myocardial infarction  secondary to cardiomyopathy   02/2021 - treated for COPD flare, neighbor burning >> exposure to fumes  04/2021 video visit >> pred taper 06/2021 hosp visit for exac   08/2021 hosp for exac, SDH 09/2021 exac after reclast infusion   11/2021 She has repeated exacerbations with sudden explosive onset suggestive of asthma,in spite of maximal triple therapy and maintenance steroids >> initiated Dupixent , AEC 300 09/17/2021   Significant tests/ events reviewed   09/2020 ABG 7.3 7/46/82 01/2017 alpha-1 >> 102 nml level ,PI* M1N   11/2014: FVC 2.00 L (63%) FEV1 1.00 L (41%) ratio 50  negative bronchodilator response TLC 5.06 L (103%) RV 140% DLCO uncorrected 51%   05/2022 LDCT chest >> unchanged nodules    CT angiogram chest 03/24/2020  Severe centrilobular emphysema with mild superimposed diffuse ground-glass opacity with an apical predominance, similar in comparison to prior study. Findings likely reflect smoking related lung disease/respiratory bronchiolitis. New mild compression fracture deformity of L1 since 10/ 2021  01/2020 LDCT - stable, T7 fracture    10/2016 CT chest showed subcentimeter nodules which have been stable since 2017, right adrenal nodule was stable since 2015 and considered benign    On exam -able to speak in full sentences, no accessory muscle use  Impression/plan Severe COPD with multiple admissions for exacerbation   -She is tapered down to 10 mg and will continue to taper by alternating 5 mg on Monday/Wednesday/Friday with 10 mg on other days and stay at this dose.  She knows to escalate to 40 mg of prednisone should she have signs and symptoms of exacerbation. She will continue on Symbicort with DuoNebs combination.  We have previously considered triple neb therapy  but this was too expensive for her.  Acute on chronic respiratory failure with hypoxia and hypercarbia-continue NIV during sleep and oxygen at all times  Stress cardiomyopathy/chronic systolic heart  failure -creatinine was down to 1.2 on discharge.  Defer to cardiology but should consider adding ARB to her regimen.  Goals of care-we discussed initiation of hospice versus continuing outpatient palliative care follow-up.  At this point she does not seem to be ready for hospice.  DNR/DNI has been issued  Total encounter time was 29 minutes

## 2022-11-06 NOTE — Patient Instructions (Signed)
100 mg bid x 7 days

## 2022-11-11 ENCOUNTER — Telehealth: Payer: Self-pay | Admitting: Adult Health

## 2022-11-11 NOTE — Telephone Encounter (Signed)
This was faxed. Will reach out for additional questions.

## 2022-11-11 NOTE — Telephone Encounter (Signed)
Checking on progress of previously sent home health orders [phone]

## 2022-11-12 NOTE — Telephone Encounter (Signed)
Spoke to [place] and she advised that she has not received multiple patients orders. She advised me to send int to [phone]. Will find PPW and refax.

## 2022-11-14 NOTE — Telephone Encounter (Signed)
PPW faxed multiple times no confirmation

## 2022-11-18 NOTE — Telephone Encounter (Signed)
Spoke to [name] and she stated fax was received on her end. Closing note.

## 2022-11-21 ENCOUNTER — Telehealth: Payer: Self-pay | Admitting: Adult Health

## 2022-11-21 NOTE — Telephone Encounter (Signed)
Kim [name] - PT with [facility] [phone] [place] to leave a detailed message on this line   Verbal Order to Continue PT 1 x a wk for 4 more weeks

## 2022-11-25 NOTE — Telephone Encounter (Signed)
Attempted to reach [place]. Left a voicemail message that Kandee Keen ok for the verbal order. To give [place] a call if have any questions.

## 2022-11-27 ENCOUNTER — Telehealth: Payer: Self-pay | Admitting: Adult Health

## 2022-11-27 NOTE — Telephone Encounter (Signed)
Prescription Request  11/27/2022  LOV: 09/24/2022  What is the name of the medication or equipment? ezetimibe (ZETIA) 10 MG tablet  Have you contacted your pharmacy to request a refill? Yes   Which pharmacy would you like this sent to?  [id number] [place], [place] - [address] AT [facility] [address] [facility] [address] [place] [address] Phone: [phone] Fax: [phone]    Patient notified that their request is being sent to the clinical staff for review and that they should receive a response within 2 business days.   Please advise at Mobile [phone] (mobile)

## 2022-11-28 MED ORDER — EZETIMIBE 10 MG PO TABS: ORAL_TABLET | ORAL | 1 refills | Status: DC

## 2022-11-28 NOTE — Addendum Note (Signed)
Addended by: Waymon Amato R on: 11/28/2022 04:59 PM   Modules accepted: Orders

## 2022-11-28 NOTE — Telephone Encounter (Signed)
Rx refilled.

## 2022-12-28 ENCOUNTER — Other Ambulatory Visit: Payer: Self-pay | Admitting: Nurse Practitioner

## 2022-12-30 NOTE — Telephone Encounter (Signed)
Called and spoke w patient.  She has continued spironolactone 12.5 mg daily.  I refilled this and scheduled her w APP.  She said that she missed some of her hosp follow ups in July because shew as just so sick.  No acute issues at this time.   Scheduled her in 2 weeks w APP.

## 2023-01-06 ENCOUNTER — Encounter: Payer: Self-pay | Admitting: Nurse Practitioner

## 2023-01-06 ENCOUNTER — Ambulatory Visit: Payer: Medicare Other | Attending: Nurse Practitioner | Admitting: Nurse Practitioner

## 2023-01-06 VITALS — BP 118/66 | HR 78 | Ht 61.5 in | Wt 148.6 lb

## 2023-01-06 DIAGNOSIS — I5042 Chronic combined systolic (congestive) and diastolic (congestive) heart failure: Secondary | ICD-10-CM

## 2023-01-06 DIAGNOSIS — J4489 Other specified chronic obstructive pulmonary disease: Secondary | ICD-10-CM | POA: Diagnosis present

## 2023-01-06 DIAGNOSIS — I10 Essential (primary) hypertension: Secondary | ICD-10-CM | POA: Diagnosis present

## 2023-01-06 DIAGNOSIS — J9612 Chronic respiratory failure with hypercapnia: Secondary | ICD-10-CM | POA: Diagnosis present

## 2023-01-06 DIAGNOSIS — E785 Hyperlipidemia, unspecified: Secondary | ICD-10-CM

## 2023-01-06 DIAGNOSIS — I2510 Atherosclerotic heart disease of native coronary artery without angina pectoris: Secondary | ICD-10-CM | POA: Diagnosis present

## 2023-01-06 DIAGNOSIS — J9611 Chronic respiratory failure with hypoxia: Secondary | ICD-10-CM

## 2023-01-06 DIAGNOSIS — R55 Syncope and collapse: Secondary | ICD-10-CM | POA: Diagnosis not present

## 2023-01-06 DIAGNOSIS — I428 Other cardiomyopathies: Secondary | ICD-10-CM | POA: Diagnosis present

## 2023-01-06 MED ORDER — POTASSIUM CHLORIDE CRYS ER 20 MEQ PO TBCR: 20.0000 meq | EXTENDED_RELEASE_TABLET | Freq: Every day | ORAL | 3 refills | Status: DC

## 2023-01-06 MED ORDER — EZETIMIBE 10 MG PO TABS: 10.0000 mg | ORAL_TABLET | Freq: Every day | ORAL | 3 refills | Status: DC

## 2023-01-06 MED ORDER — TORSEMIDE 20 MG PO TABS: 20.0000 mg | ORAL_TABLET | Freq: Every morning | ORAL | 3 refills | Status: DC

## 2023-01-06 MED ORDER — EZETIMIBE 10 MG PO TABS: ORAL_TABLET | ORAL | 3 refills | Status: DC

## 2023-01-06 NOTE — Progress Notes (Signed)
:  Cardiology Office Note:  .   Date:  01/06/2023  ID:  Kim [name], DOB [birth year], MRN [id number] PCP: Shirline Frees, NP  [facility] Providers Cardiologist:  Verne Carrow, MD    Patient Profile: .      PMH Coronary artery disease Cath 2017: min non-obs CAD Cath 11/2020: no CAD HFimpEF (heart failure with improved EF)/NICM Cath 06/2015: EF 20-25 Echocardiogram 08/2016: Normal EF Echocardiogram 07/2019: Normal EF Takotsubo cardiomyopathy Admission 11/2020 >> + trop Echocardiogram: EF 30-35, apical wma/no CAD on cath Echocardiogram 12/2020: EF 55-60 COPD on home O2 Chronic respiratory failure Hypertension Hyperlipidemia Former tobacco abuse  History of syncope December 2022.  Was walking and woke up on the floor, workup reassuring by neurology.  She reported dizzy spells and blurry vision after cataract surgery and was being followed by neurology for this.  Normal EEG and MRI of brain.  Found to be orthostatic with SBP dropping 30 points.  No medication adjustment was done.  3 days prior to admission she had loss of consciousness.  Was folding laundry and went to reach for a banana on the countertop.  Next thing she recalls was waking up on the floor.  No prodromal symptoms.  Admission 5/22-5/25/23 following syncope and fall.  Was found to have COPD exacerbation and subdural hematoma.  Evaluated by neurosurgery with recommendation for nonsurgical management.  Telemetry was negative during hospitalization.  Respiratory status significantly improved during hospitalization.  Elevated troponin felt to be in the setting of panic attacks/newly identified reduced LVEF.  No chest pain.  At follow-up visit she was having residual dizziness.  Admission 6/13-6/14/23 with shortness of breath.  Was feeling well on the day prior to admission but awoke overnight profoundly short of breath.  Home O2 sat was 90%.  She was placed on BiPAP in the ED with persistent tachycardia, increased  WOB with moderate air movement. Hs troponin was elevated at 42 >>272.  Wore ZIO monitor for dizziness.  Elevated troponin felt to be demand ischemia in the setting of COPD exacerbation.  Metoprolol was changed to bisoprolol.  Cardiac monitor revealed 1 run of VT lasting 43.4 seconds with max HR 72 bpm that occurred on 09/17/2021, however we were not notified until 09/25/2021.  She was referred to EP.  Return admission 6/15-6/20/23 with recurrent COPD and mild CHF.  LVEF normal on echo. GDMT limited by hypotension. At follow-up in cardiology clinic, multiple recent admissions were discussed. She was given parameters for BP monitoring and CHF management  Spironolactone 12.5 mg daily was added.   Seen by Dr. Ladona Ridgel on 10/11/21 for NSVT. Watchful waiting and continuation of beta blocker were advised.   Last cardiology clinic visit was 07/14/22 with Dr. Clifton James. He had previously advised a trial of holding spironolactone and resuming losartan to see if dizziness improved, however there was no improvement and she resumed spironolactone 12.5 mg daily. She stopped losartan. She reported continued dizziness and 2 syncopal events the previous month.  She had been seen by Malena Peer, [facility] for management of hyperlipidemia with no improvement in lipids on rosuvastatin 5 mg daily and ezetimibe 10 mg daily.  She was advised to increase rosuvastatin to 20 mg daily.  30-day event monitor was initiated and she was advised to stop spironolactone.  Monitor completed 08/20/2022 revealed sinus rhythm with rare PACs and PVCs, one 4 beat run of VT, no atrial fibrillation or AV block.  Since that time, she has had 3 hospital admissions. Cardiology consulted 09/11/22  for elevated troponin. TTE 09/11/22 revealed LVEF 35-40%, no rwma, G1DD, normal RV, no significant valve disease.   Carotid duplex 09/22/22 revealed minimal plaque on bilaterally.        History of Present Illness: .   Kim [name] is a very pleasant 67 y.o. female who  is here today with her husband for follow-up. She is traveling in a wheelchair. Today is the first time she has been outside of her house except on her deck since June. She is gradually increasing activity at home, has been working with PT. Feels that she is retaining fluid. Weight has increased 10 lbs since hospital discharge. Usually has hand and abdominal swelling, no leg edema. She is sleeping well, no orthopnea or PND. Does not get significant output with Lasix. On one occasion she took 2 additional doses of Lasix 40 mg within a 24 hour period and had little improvement. Has good appetite, admits she may be eating more. She does not closely follow dietary sodium. Does limit fluids to 2L daily.   ROS: See HPI       Studies Reviewed: .         Risk Assessment/Calculations:             Physical Exam:   VS:  BP 118/66   Pulse 78   Ht 5' 1.5" (1.562 m)   Wt 148 lb 9.6 oz (67.4 kg)   SpO2 95%   BMI 27.62 kg/m    Wt Readings from Last 3 Encounters:  01/06/23 148 lb 9.6 oz (67.4 kg)  11/04/22 138 lb 6.4 oz (62.8 kg)  10/15/22 144 lb 13.5 oz (65.7 kg)    GEN: Well nourished, well developed in no acute distress NECK: No JVD; No carotid bruits CARDIAC: RRR, no murmurs, rubs, gallops RESPIRATORY:  Diminished breath sounds bilaterally without rales, wheezing or rhonchi  ABDOMEN: Soft, non-tender, non-distended EXTREMITIES:  No edema; No deformity     ASSESSMENT AND PLAN: .    Chronic HFrEF/NICM: TTE 09/11/22 with LVEF 35-40%, reduced from 60-65% in June 2023. History of Takotsubo cardiomyopathy in 2017 and 2022 in setting of COPD exacerbations. No significant CAD on cath. Has weight gain of 10 lbs in 2 months. She admits to increased appetite but feels that some is fluid weight. On exam, has evidence of abdominal swelling, no edema in extremities. Diminished breath sounds bilaterally but no crackles.  GDMT has been somewhat limited by hypotension and history of syncope. We will discontinue  Lasix and try torsemide 20 mg daily. I have asked her to notify us in 2 to 3 days if symptoms do not improve.  Advised her to resume potassium chloride 20 mEq daily. Will get BMP in 1 week. Low sodium diet < 2000 mg daily. Encouraged use of app like My Fitness Pal to help her track it. Continue bisoprolol, spironolactone.   CAD: Mild plaquing of proximal LAD on cath 11/2020.  Symptoms of cardiomyopathy felt to be secondary to stress/COPD. She denies chest pain. Chronic dyspnea is stable. No other symptoms concerning for angina. No indication for further ischemic evaluation at this time. No bleeding concerns. Continue aspirin, bisoprolol, ezetimibe, rosuvastatin.  COPD/Chronic respiratory failure: Multiple exacerbations June-July 2024. Since that time, she has felt that dyspnea is stable and feels that her medications are working well. No acute concerns today. Management per pulmonology.   Hyperlipidemia LDL goal < 70: LDL 54 on 08/25/22. She made dietary changes to improve cholesterol. Encouraged continued heart healthy, low-sodium diet and  regular activity as tolerated.  Continue ezetimibe and rosuvastatin.  Hypertension: BP is well controlled today. We are changing furosemide to torsemide. Advised routine BP monitoring and report concerns.   Syncope: No recent syncope. BP has been stable.        Dispo: 3 months with me  Signed, Eligha Bridegroom, NP-C

## 2023-01-06 NOTE — Patient Instructions (Signed)
Medication Instructions:   DISCONTINUE Lasix  RESTART K-DUR one (1) tablet by mouth ( 20 mEq) daily.  START Torsemide one (1) tablet by mouth ( 20 mg) in the am.   *If you need a refill on your cardiac medications before your next appointment, please call your pharmacy*  Lab Work:  Your physician recommends that you return for lab work on Thursday, October 10. You can come in on the day of your appointment anytime between 8:00 - 4:30.   If you have labs (blood work) drawn today and your tests are completely normal, you will receive your results only by: MyChart Message (if you have MyChart) OR A paper copy in the mail If you have any lab test that is abnormal or we need to change your treatment, we will call you to review the results.  Testing/Procedures:  None ordered.  Follow-Up: At [facility], you and your health needs are our priority.  As part of our continuing mission to provide you with exceptional heart care, we have created designated Provider Care Teams.  These Care Teams include your primary Cardiologist (physician) and Advanced Practice Providers (APPs -  Physician Assistants and Nurse Practitioners) who all work together to provide you with the care you need, when you need it.  We recommend signing up for the patient portal called "MyChart".  Sign up information is provided on this After Visit Summary.  MyChart is used to connect with patients for Virtual Visits (Telemedicine).  Patients are able to view lab/test results, encounter notes, upcoming appointments, etc.  Non-urgent messages can be sent to your provider as well.   To learn more about what you can do with MyChart, go to [email or web address].    Your next appointment:   3 month(s)  Provider:   Eligha Bridegroom, NP         Other Instructions  Call on Friday @ [phone] ask for Tyus Kallam or you can send mychart message if you are not getting good urine output.  Please look into getting My  Fitness Pal to track your sodium.

## 2023-01-15 ENCOUNTER — Ambulatory Visit: Payer: Medicare Other | Attending: Nurse Practitioner

## 2023-01-15 DIAGNOSIS — I2510 Atherosclerotic heart disease of native coronary artery without angina pectoris: Secondary | ICD-10-CM

## 2023-01-15 DIAGNOSIS — E785 Hyperlipidemia, unspecified: Secondary | ICD-10-CM

## 2023-01-15 DIAGNOSIS — R55 Syncope and collapse: Secondary | ICD-10-CM

## 2023-01-15 DIAGNOSIS — I5042 Chronic combined systolic (congestive) and diastolic (congestive) heart failure: Secondary | ICD-10-CM

## 2023-01-16 LAB — BASIC METABOLIC PANEL
BUN/Creatinine Ratio: 18 (ref 12–28)
BUN: 24 mg/dL (ref 8–27)
CO2: 25 mmol/L (ref 20–29)
Calcium: 9.8 mg/dL (ref 8.7–10.3)
Chloride: 101 mmol/L (ref 96–106)
Creatinine, Ser: 1.35 mg/dL — ABNORMAL HIGH (ref 0.57–1.00)
Glucose: 166 mg/dL — ABNORMAL HIGH (ref 70–99)
Potassium: 4.5 mmol/L (ref 3.5–5.2)
Sodium: 143 mmol/L (ref 134–144)
eGFR: 43 mL/min/{1.73_m2} — ABNORMAL LOW (ref >59)

## 2023-01-26 ENCOUNTER — Other Ambulatory Visit (HOSPITAL_COMMUNITY): Payer: Self-pay

## 2023-02-01 ENCOUNTER — Other Ambulatory Visit: Payer: Self-pay | Admitting: Nurse Practitioner

## 2023-02-04 ENCOUNTER — Other Ambulatory Visit (HOSPITAL_COMMUNITY): Payer: Self-pay

## 2023-02-05 ENCOUNTER — Encounter: Payer: Self-pay | Admitting: Adult Health

## 2023-02-05 ENCOUNTER — Ambulatory Visit: Payer: Medicare Other | Admitting: Adult Health

## 2023-02-05 VITALS — BP 110/70 | HR 73 | Temp 98.2°F | Ht 61.5 in | Wt 148.0 lb

## 2023-02-05 DIAGNOSIS — F32A Depression, unspecified: Secondary | ICD-10-CM

## 2023-02-05 DIAGNOSIS — M159 Polyosteoarthritis, unspecified: Secondary | ICD-10-CM | POA: Diagnosis not present

## 2023-02-05 DIAGNOSIS — N1832 Chronic kidney disease, stage 3b: Secondary | ICD-10-CM | POA: Diagnosis not present

## 2023-02-05 DIAGNOSIS — Z23 Encounter for immunization: Secondary | ICD-10-CM

## 2023-02-05 DIAGNOSIS — G8929 Other chronic pain: Secondary | ICD-10-CM | POA: Diagnosis not present

## 2023-02-05 DIAGNOSIS — F419 Anxiety disorder, unspecified: Secondary | ICD-10-CM | POA: Diagnosis not present

## 2023-02-05 LAB — BASIC METABOLIC PANEL
BUN: 30 mg/dL — ABNORMAL HIGH (ref 6–23)
CO2: 31 meq/L (ref 19–32)
Calcium: 9.7 mg/dL (ref 8.4–10.5)
Chloride: 100 meq/L (ref 96–112)
Creatinine, Ser: 1.68 mg/dL — ABNORMAL HIGH (ref 0.40–1.20)
GFR: 30.95 mL/min — ABNORMAL LOW (ref >60.00)
Glucose, Bld: 126 mg/dL — ABNORMAL HIGH (ref 70–99)
Potassium: 4.1 meq/L (ref 3.5–5.1)
Sodium: 142 meq/L (ref 135–145)

## 2023-02-05 MED ORDER — ALPRAZOLAM 1 MG PO TABS
1.0000 mg | ORAL_TABLET | Freq: Three times a day (TID) | ORAL | 2 refills | Status: AC
Start: 2023-02-05 — End: 2023-03-07

## 2023-02-05 NOTE — Progress Notes (Signed)
Subjective:    Patient ID: Kim [name], female    DOB: 09/09/1955, 67 y.o.   MRN: [id number]  HPI he presents to the office today for chronic pain management visit   NCCSRS reviewed in EPIC   Indication for chronic opioid: Osteoarthritis involving multiple joints  Medication and dose: Norco 7.5-325 mg Q6 H PRN  # pills per month: 45 Last UDS date: 01/29/2022 Opioid Treatment Agreement signed: 02/05/2023 Opioid Treatment Agreement last reviewed with patient: Today  NCCSRS reviewed this encounter (include red flags):  Today   She also needs a refill of Xanax. She has been feeling more anxious due to worsening COPD.   She would also like to recheck her kidney function since is was decreased at her visit with cardiology earlier this month.    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   OSTEOARTHRITIS 06/05/2009   PARESTHESIA 08/24/2007   TOBACCO ABUSE 01/25/2010    Social History   Socioeconomic History   Marital status: Married    Spouse name: Thereasa Distance   Number of children: 2   Years of education: Not on file   Highest education level: Not on file  Occupational History   Occupation: Patient care aide-sits with elderly lady  Tobacco Use   Smoking status: Former    Current packs/day: 0.00    Average packs/day: 2.0 packs/day for 38.0 years (76.0 ttl pk-yrs)    Types: Cigarettes    Start date: 03/07/1976    Quit date: 03/07/2014    Years since quitting: 8.9   Smokeless tobacco: Never  Vaping Use   Vaping status: Never Used  Substance and Sexual Activity   Alcohol use: No    Alcohol/week: 0.0 standard drinks of alcohol   Drug use: No   Sexual activity: Not on file  Other Topics Concern   Not on file  Social History Narrative   [facility] (01/12/17):   Originally from [facility]. Has  lived in [place] for 17 years. Married. Has 2 dogs currently. No mold and only 1 indoor plant. Had her home professionally cleaned a month ago. No bird or hot tub exposure. 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    Right handed    Social Determinants of Health   Financial Resource Strain: High Risk (09/10/2021)   Overall Financial Resource Strain (CARDIA)    Difficulty of Paying Living Expenses: Very hard  Food Insecurity: No Food Insecurity (10/17/2022)   Hunger Vital Sign    Worried About Running Out of Food in the Last Year: Never true    Ran Out of Food in the Last Year: Never true  Transportation Needs: No Transportation Needs (10/17/2022)   PRAPARE - Administrator, Civil Service (Medical): No    Lack of Transportation (Non-Medical): No  Physical Activity: Inactive (03/25/2021)   Exercise Vital Sign    Days of Exercise per Week: 0 days    Minutes of Exercise per Session: 0 min  Stress: No Stress Concern Present (09/10/2021)   [employer] of Occupational Health - Occupational Stress Questionnaire    Feeling of Stress : Not at all  Social Connections: Moderately Isolated (03/25/2021)   Social Connection and Isolation Panel [NHANES]    Frequency of Communication with Friends and Family: More  than three times a week    Frequency of Social Gatherings with Friends and Family: More than three times a week    Attends Religious Services: Never    Database administrator or Organizations: No    Attends Banker Meetings: Never    Marital Status: Married  Catering manager Violence: Not At Risk (10/09/2022)   Humiliation, Afraid, Rape, and Kick questionnaire    Fear of Current or Ex-Partner: No    Emotionally Abused: No    Physically Abused: No    Sexually Abused: No    Past Surgical History:  Procedure Laterality Date   ABDOMINAL HYSTERECTOMY     APPENDECTOMY  1969   CARDIAC CATHETERIZATION N/A 07/05/2015   Procedure: Left Heart  Cath and Coronary Angiography;  Surgeon: Marykay Lex, MD;  Location: [facility]VAS[facility] CV LAB;  Service: Cardiovascular;  Laterality: N/A;   CHOLECYSTECTOMY  1989   collapse lung  1990   COLONOSCOPY WITH PROPOFOL N/A 05/14/2018   Procedure: COLONOSCOPY WITH PROPOFOL;  Surgeon: Hilarie Fredrickson, MD;  Location: [facility] ENDOSCOPY;  Service: Endoscopy;  Laterality: N/A;   EXCISION MASS ABDOMINAL  2019   EXPLORATORY LAPAROTOMY  1969   LEFT HEART CATH AND CORONARY ANGIOGRAPHY N/A 11/19/2020   Procedure: LEFT HEART CATH AND CORONARY ANGIOGRAPHY;  Surgeon: Yvonne Kendall, MD;  Location: [facility] INVASIVE CV LAB;  Service: Cardiovascular;  Laterality: N/A;   POLYPECTOMY  05/14/2018   Procedure: POLYPECTOMY;  Surgeon: Hilarie Fredrickson, MD;  Location: [facility] ENDOSCOPY;  Service: Endoscopy;;   SALPINGOOPHORECTOMY  2019    Family History  Problem Relation Age of Onset   Cancer Mother        lung   Heart attack Mother    Hypertension Sister    Multiple sclerosis Brother    Diabetes Sister    Rheum arthritis Sister    Thyroid disease Sister    Multiple sclerosis Other    Alcohol abuse Other    Arthritis Other    Diabetes Other    Kidney disease Other    Cancer Other        lung,ovarian,skin, uterine   Stroke Other    Heart disease Other    Melanoma Other    Osteoporosis Other    Heart attack Maternal Uncle    Heart attack Other        NEICE   Heart attack Other        NEPHEW   Hypertension Brother    Stroke Maternal Grandmother    Colon cancer Neg Hx     Allergies  Allergen Reactions   Tape Other (See Comments)    Skin tears easily, peels skin off   Daliresp [Roflumilast] Other (See Comments)    Skin peeling   Cozaar [Losartan] Other (See Comments)    Possible cause of severe hypotension   Dupixent [Dupilumab] Diarrhea, Swelling and Cough    Facial swelling Headache   Latex Other (See Comments)    Peels skin off   Morphine And Codeine Other (See Comments)    Hallucinations    Penicillins Hives    Statins Other (See Comments)    Myalgia    Zestril [Lisinopril] Cough   Omnicef [Cefdinir] Rash    Current Outpatient Medications on File Prior to Visit  Medication Sig Dispense Refill   albuterol (VENTOLIN HFA) 108 (90 Base) MCG/ACT inhaler INHALE 2 PUFFS INTO THE LUNGS EVERY 4 TO 6 HOURS AS NEEDED FOR SHORTNESS OF BREATH OR WHEEZING 18 g 3  aspirin EC 81 MG EC tablet Take 1 tablet (81 mg total) by mouth daily.     bisoprolol (ZEBETA) 5 MG tablet Take 0.5 tablets (2.5 mg total) by mouth daily as needed (Take HALF tablet by mouth for break through chest pain as needed). (Patient taking differently: Take 2.5 mg by mouth daily.) 45 tablet 2   budesonide-formoterol (SYMBICORT) 160-4.5 MCG/ACT inhaler Inhale 2 puffs into the lungs 2 (two) times daily. 10.2 g 0   butalbital-acetaminophen-caffeine (FIORICET) 50-325-40 MG tablet Take 1 tablet by mouth every 4 (four) hours as needed for headache. TAKE 1 TABLET BY MOUTH EVERY 4 HOURS AS NEEDED FOR HEADACHETAKE 1 TABLET BY MOUTH EVERY 4 HOURS AS NEEDED FOR HEADACHE 14 tablet 3   cetirizine (ZYRTEC) 10 MG tablet Take 10 mg by mouth daily.     Cholecalciferol (VITAMIN D3 PO) Take 5,000 Units by mouth daily.     Cyanocobalamin (VITAMIN B-12 PO) Take 1 tablet by mouth daily.     doxycycline (VIBRA-TABS) 100 MG tablet Take 1 tablet (100 mg total) by mouth 2 (two) times daily. 14 tablet 0   ezetimibe (ZETIA) 10 MG tablet TAKE 1 TABLET(10 MG) BY MOUTH DAILY 90 tablet 3   HYDROcodone-acetaminophen (NORCO) 7.5-325 MG tablet Take 1 tablet by mouth every 6 (six) hours as needed for moderate pain. 30 tablet 0   magnesium oxide (MAG-OX) 400 MG tablet Take 400 mg by mouth daily as needed (constipation).     MUCINEX MAXIMUM STRENGTH 1200 MG TB12 Take 1,200 mg by mouth every 12 (twelve) hours.     nitroGLYCERIN (NITROSTAT) 0.4 MG SL tablet Place 1 tablet (0.4 mg total) under the tongue every 5 (five) minutes as needed for chest pain. 25 tablet 3   nystatin  (MYCOSTATIN) 100000 UNIT/ML suspension Take 5 mLs (500,000 Units total) by mouth 4 (four) times daily. 473 mL 2   ondansetron (ZOFRAN) 4 MG tablet Take 1 tablet (4 mg total) by mouth every 8 (eight) hours as needed for nausea or vomiting. 20 tablet 0   OXYGEN Inhale 2-3 L/min into the lungs continuous.     PARoxetine (PAXIL) 40 MG tablet TAKE 1 TABLET(40 MG) BY MOUTH EVERY MORNING (Patient taking differently: Take 40 mg by mouth daily.) 90 tablet 1   polyethylene glycol powder (GLYCOLAX/MIRALAX) 17 GM/SCOOP powder Take 17 g by mouth daily as needed for moderate constipation (constipation).     potassium chloride SA (KLOR-CON M) 20 MEQ tablet Take 1 tablet (20 mEq total) by mouth daily. 90 tablet 3   rosuvastatin (CRESTOR) 20 MG tablet Take 1 tablet (20 mg total) by mouth daily. 90 tablet 3   spironolactone (ALDACTONE) 25 MG tablet TAKE 1/2 TABLET(12.5 MG) BY MOUTH DAILY 45 tablet 2   torsemide (DEMADEX) 20 MG tablet Take 1 tablet (20 mg total) by mouth every morning. 90 tablet 3   ipratropium-albuterol (DUONEB) 0.5-2.5 (3) MG/3ML SOLN Take 3 mLs by nebulization every 4 (four) hours as needed. 180 mL 0   No current facility-administered medications on file prior to visit.    BP 110/70   Pulse 73   Temp 98.2 F (36.8 C) (Oral)   Ht 5' 1.5" (1.562 m)   Wt 148 lb (67.1 kg)   SpO2 98%   BMI 27.51 kg/m       Objective:   Physical Exam Vitals and nursing note reviewed.  Constitutional:      Appearance: Normal appearance.  Cardiovascular:     Rate and Rhythm: Normal rate and  regular rhythm.     Pulses: Normal pulses.     Heart sounds: Normal heart sounds.  Pulmonary:     Effort: Pulmonary effort is normal.     Breath sounds: Normal breath sounds.     Comments: On chronic oxygen therapy via [place]  Skin:    General: Skin is warm and dry.  Neurological:     General: No focal deficit present.     Mental Status: She is alert and oriented to person, place, and time.  Psychiatric:         Mood and Affect: Mood normal.        Behavior: Behavior normal.        Thought Content: Thought content normal.        Judgment: Judgment normal.       Assessment & Plan:  1. Encounter for chronic pain management  - DRUG MONITOR, PANEL 1, W/CONF, URINE; Future  2. Osteoarthritis of multiple joints, unspecified osteoarthritis type  - DRUG MONITOR, PANEL 1, W/CONF, URINE; Future  3. Anxiety and depression  - ALPRAZolam (XANAX) 1 MG tablet; Take 1 tablet (1 mg total) by mouth 3 (three) times daily.  Dispense: 90 tablet; Refill: 2  4. Stage 3b chronic kidney disease (HCC)  - Basic Metabolic Panel; Future  Time spent with patient today was 38 minutes which consisted of chart review, discussing pain management, COPD, anxiety and CKD, work up, treatment answering questions and documentation.

## 2023-02-07 ENCOUNTER — Encounter: Payer: Self-pay | Admitting: Adult Health

## 2023-02-07 DIAGNOSIS — F419 Anxiety disorder, unspecified: Secondary | ICD-10-CM

## 2023-02-14 ENCOUNTER — Other Ambulatory Visit: Payer: Self-pay | Admitting: Cardiovascular Disease

## 2023-02-18 ENCOUNTER — Other Ambulatory Visit: Payer: Medicare Other

## 2023-02-20 ENCOUNTER — Other Ambulatory Visit: Payer: Self-pay | Admitting: Adult Health

## 2023-02-20 DIAGNOSIS — G8929 Other chronic pain: Secondary | ICD-10-CM

## 2023-02-20 DIAGNOSIS — M159 Polyosteoarthritis, unspecified: Secondary | ICD-10-CM

## 2023-02-20 LAB — DRUG MONITOR, PANEL 1, W/CONF, URINE
Alphahydroxyalprazolam: 424 ng/mL — ABNORMAL HIGH (ref <25)
Alphahydroxymidazolam: NEGATIVE ng/mL (ref <50)
Alphahydroxytriazolam: NEGATIVE ng/mL (ref <50)
Aminoclonazepam: NEGATIVE ng/mL (ref <25)
Amphetamines: NEGATIVE ng/mL (ref <500)
Barbiturates: NEGATIVE ng/mL (ref <300)
Benzodiazepines: POSITIVE ng/mL — AB (ref <100)
Cocaine Metabolite: NEGATIVE ng/mL (ref <150)
Codeine: NEGATIVE ng/mL (ref <50)
Creatinine: 181.5 mg/dL (ref >=20.0)
Hydrocodone: 628 ng/mL — ABNORMAL HIGH (ref <50)
Hydromorphone: NEGATIVE ng/mL (ref <50)
Hydroxyethylflurazepam: NEGATIVE ng/mL (ref <50)
Lorazepam: NEGATIVE ng/mL (ref <50)
Marijuana Metabolite: NEGATIVE ng/mL (ref <20)
Methadone Metabolite: NEGATIVE ng/mL (ref <100)
Morphine: NEGATIVE ng/mL (ref <50)
Nordiazepam: NEGATIVE ng/mL (ref <50)
Norhydrocodone: 713 ng/mL — ABNORMAL HIGH (ref <50)
Opiates: POSITIVE ng/mL — AB (ref <100)
Oxazepam: NEGATIVE ng/mL (ref <50)
Oxidant: NEGATIVE ug/mL (ref <200)
Oxycodone: NEGATIVE ng/mL (ref <100)
Phencyclidine: NEGATIVE ng/mL (ref <25)
Temazepam: NEGATIVE ng/mL (ref <50)
pH: 5.1 (ref 4.5–9.0)

## 2023-02-20 LAB — DM TEMPLATE

## 2023-02-20 MED ORDER — HYDROCODONE-ACETAMINOPHEN 7.5-325 MG PO TABS
1.0000 | ORAL_TABLET | Freq: Four times a day (QID) | ORAL | 0 refills | Status: DC | PRN
Start: 2023-02-20 — End: 2023-04-23

## 2023-02-20 MED ORDER — HYDROCODONE-ACETAMINOPHEN 7.5-325 MG PO TABS
1.0000 | ORAL_TABLET | Freq: Four times a day (QID) | ORAL | 0 refills | Status: DC | PRN
Start: 2023-02-20 — End: 2023-06-09

## 2023-02-20 MED ORDER — HYDROCODONE-ACETAMINOPHEN 7.5-325 MG PO TABS: 1.0000 | ORAL_TABLET | Freq: Four times a day (QID) | ORAL | 0 refills | Status: DC | PRN

## 2023-03-14 ENCOUNTER — Other Ambulatory Visit: Payer: Self-pay | Admitting: Pulmonary Disease

## 2023-03-20 ENCOUNTER — Telehealth (HOSPITAL_BASED_OUTPATIENT_CLINIC_OR_DEPARTMENT_OTHER): Payer: Self-pay | Admitting: Pulmonary Disease

## 2023-03-20 MED ORDER — PREDNISONE 10 MG PO TABS: 10.0000 mg | ORAL_TABLET | Freq: Every day | ORAL | 3 refills | Status: DC

## 2023-03-20 NOTE — Telephone Encounter (Signed)
  Palliative care RN Raynelle Fanning) visited patient at home. Increased sputum production and wheezing. Called patient and she reports she has been unable to obtain refill for prednisone. She takes 5 mg daily. Has previously been advised to taper up to 40 mg when she has exacerbation.  Refilled prednisone. Advised patient to call office if symptoms persist by Monday. May need azithro or doxy for COPD exacerbation.

## 2023-04-03 ENCOUNTER — Other Ambulatory Visit: Payer: Self-pay | Admitting: Acute Care

## 2023-04-03 DIAGNOSIS — Z87891 Personal history of nicotine dependence: Secondary | ICD-10-CM

## 2023-04-03 DIAGNOSIS — Z122 Encounter for screening for malignant neoplasm of respiratory organs: Secondary | ICD-10-CM

## 2023-04-16 ENCOUNTER — Ambulatory Visit (HOSPITAL_BASED_OUTPATIENT_CLINIC_OR_DEPARTMENT_OTHER): Payer: Medicare Other

## 2023-04-16 ENCOUNTER — Encounter (HOSPITAL_BASED_OUTPATIENT_CLINIC_OR_DEPARTMENT_OTHER): Payer: Self-pay | Admitting: Pulmonary Disease

## 2023-04-16 ENCOUNTER — Ambulatory Visit (HOSPITAL_BASED_OUTPATIENT_CLINIC_OR_DEPARTMENT_OTHER): Payer: Medicare Other | Admitting: Pulmonary Disease

## 2023-04-16 VITALS — BP 118/58 | HR 66 | Resp 20 | Ht 61.5 in | Wt 158.0 lb

## 2023-04-16 DIAGNOSIS — J9612 Chronic respiratory failure with hypercapnia: Secondary | ICD-10-CM

## 2023-04-16 DIAGNOSIS — J9611 Chronic respiratory failure with hypoxia: Secondary | ICD-10-CM | POA: Diagnosis not present

## 2023-04-16 DIAGNOSIS — J441 Chronic obstructive pulmonary disease with (acute) exacerbation: Secondary | ICD-10-CM | POA: Diagnosis not present

## 2023-04-16 DIAGNOSIS — M79601 Pain in right arm: Secondary | ICD-10-CM

## 2023-04-16 NOTE — Patient Instructions (Signed)
 Decrease pred nisone to 5 mg daily  New medication start - Ohtuwayre nebs  XR -RUE

## 2023-04-16 NOTE — Progress Notes (Signed)
 Subjective:    Patient ID: Kim [name], female    DOB: [birth year], 68 y.o.   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   Meds-stopped Daliresp  due to skin peeling 01/2021 Dupixent  caused facial edema and erythema         COPD exacerbations: Multiple since 2020, 2 hospitalizations in 2021 03/2020 hospitalized for 10 days for COPD exacerbation 09/2020 developed COVID infection, received monoclonal antibody 11/2020 adm for COPD exacerbation and stress-induced cardiomyopathy/acute systolic heart failure/type II myocardial infarction secondary to cardiomyopathy   68/2022 - treated for COPD flare, neighbor burning >> exposure to fumes  04/2021 video visit >> pred taper 06/2021 hosp visit for exac   08/2021 hosp for exac, SDH 09/2021 exac after reclast  infusion   11/2021 She has repeated exacerbations with sudden explosive onset suggestive of asthma,in spite of maximal triple therapy and maintenance steroids >> initiated Dupixent  , AEC 300 09/17/2021  hospitalized 6/5 to 70/13/24  for COPD exacerbation and found to have a relapse of her stress cardiomyopathy with EF dropped to 35%.  She improved with diuresis and steroids and got readmitted for 3 days for more diuresis.  She was again readmitted 70/4 to 70/10/24 for COPD/CHF exacerbation with acute diuresis this time discharged with palliative care follow-up at home .    Discussed the use of AI scribe software for clinical note transcription with the patient, who gave verbal consent to proceed.  History of Present Illness   The patient, with a history of respiratory disease, presents with increasing shortness of breath and heaviness in the chest. The patient reports that these symptoms are particularly noticeable when standing or walking. The patient is currently on prednisone ,  albuterol , and a nebulizer, which provide some relief. However, the patient has been experiencing frequent falls, which she attributes to orthostatic hypotension. These falls have resulted in multiple injuries, including a recent arm injury that the patient believes may be a fracture. The patient also reports a significant weight gain of 20 pounds since July, which she is concerned may be due to fluid retention. The patient's caregiver confirms these symptoms and adds that the patient's shortness of breath and falls typically occur after taking Lasix  at night.        Significant tests/ events reviewed   09/2020 ABG 7.3 7/46/82 01/2017 alpha-1 >> 102 nml level ,PI* M1N   11/2014: FVC 2.00 L (63%) FEV1 1.00 L (41%) ratio 50  negative bronchodilator response TLC 5.06 L (103%) RV 140% DLCO uncorrected 51%   05/2022 LDCT chest >> unchanged nodules     CT angiogram chest 03/24/2020  Severe centrilobular emphysema with mild superimposed diffuse ground-glass opacity with an apical predominance, similar in comparison to prior study. Findings likely reflect smoking related lung disease/respiratory bronchiolitis. New mild compression fracture deformity of L1 since 10/ 2021   01/2020 LDCT - stable, T7 fracture     10/2016 CT chest showed subcentimeter nodules which have been stable since 2017, right adrenal nodule was stable since 2015 and considered benign  Review of Systems neg for any significant sore throat, dysphagia, itching, sneezing, nasal congestion or excess/ purulent secretions, fever, chills, sweats, unintended wt loss, pleuritic or exertional cp, hempoptysis, orthopnea pnd or change in chronic leg swelling. Also denies presyncope, palpitations, heartburn, abdominal pain, nausea, vomiting, diarrhea or change in bowel or urinary habits, dysuria,hematuria, rash,  arthralgias, visual complaints, headache, numbness weakness or ataxia.     Objective:   Physical Exam  Gen. Pleasant, well-nourished,  in no distress ENT - no thrush, no pallor/icterus,no post nasal drip, steroid facies Neck: No JVD, no thyromegaly, no carotid bruits Lungs: no use of accessory muscles, no dullness to percussion, clear without rales or rhonchi  Cardiovascular: Rhythm regular, heart sounds  normal, no murmurs or gallops, no peripheral edema Musculoskeletal: No deformities, no cyanosis or clubbing  Ecchymosis, tenderness RUE , decreased ROM rt elbow due to pain       Assessment & Plan:

## 2023-04-20 ENCOUNTER — Telehealth: Payer: Self-pay | Admitting: Pharmacist

## 2023-04-20 NOTE — Telephone Encounter (Signed)
 Received new start paperwork for [facility]. Completed and faxed to [facility] with insurance card copy and clinicals  Phone#: [phone] Fax#: [phone]

## 2023-04-22 ENCOUNTER — Telehealth (HOSPITAL_BASED_OUTPATIENT_CLINIC_OR_DEPARTMENT_OTHER): Payer: Self-pay | Admitting: Pulmonary Disease

## 2023-04-22 NOTE — Telephone Encounter (Signed)
 Thread from mychart encounter after the visit on 04/16/23. Please advise and call patient with results if available.    [name], I had an x-ray done of my upper right arm last Thursday. Can you please tell me if you have gotten theresults back. I appreciate your help.   Staff: Jefm Minium,    Imaging services are backed up right now. I would give it another 7 days. If you do not hear anything in 7 days message me back.   Thanks so much, [name]: Kim [name], I think seven days is a bit too long for an x-ray to come back, and now you're telling me it may be seven more days. That is not helping the pain in my arm. I don't think Dr. Villa Greaser would really agree with this. If it is not Are broken it still needs attention. Please check with Dr. Alva and let him decide if I need to go somewhere else and have an x-ray. I cannot imagine his office being this disorganized. I'm sure he will not be happy. Thank you, [name]. I will ask him if he got this message.

## 2023-04-22 NOTE — Progress Notes (Signed)
:  Cardiology Office Note:  .   Date:  04/23/2023  ID:  Kim [name], DOB [birth year], MRN [id number] PCP: Shirline Frees, NP  [facility] Providers Cardiologist:  Verne Carrow, MD    Patient Profile: .      PMH Coronary artery disease Cath 2017: min non-obs CAD Cath 11/2020: Mild plaquing of proximal LAD with minimal luminal narrowing HFimpEF (heart failure with improved EF)/NICM Cath 06/2015: EF 20-25 Echocardiogram 08/2016: Normal EF Echocardiogram 07/2019: Normal EF Takotsubo cardiomyopathy Admission 11/2020 >> + trop Echocardiogram: EF 30-35, apical wma/no CAD on cath Echocardiogram 12/2020: EF 55-60 COPD on home O2 Chronic respiratory failure Hypertension Hyperlipidemia Former tobacco abuse  History of syncope December 2022.  Was walking and woke up on the floor, workup reassuring by neurology.  She reported dizzy spells and blurry vision after cataract surgery and was being followed by neurology for this.  Normal EEG and MRI of brain.  Found to be orthostatic with SBP dropping 30 points.  No medication adjustment was done.  3 days prior to admission she had loss of consciousness.  Was folding laundry and went to reach for a banana on the countertop.  Next thing she recalls was waking up on the floor.  No prodromal symptoms.  Admission 5/22-5/25/23 following syncope and fall.  Was found to have COPD exacerbation and subdural hematoma.  Evaluated by neurosurgery with recommendation for nonsurgical management.  Telemetry was negative during hospitalization.  Respiratory status significantly improved during hospitalization.  Elevated troponin felt to be in the setting of panic attacks/newly identified reduced LVEF.  No chest pain.  At follow-up visit she was having residual dizziness.  Admission 6/13-6/14/23 with shortness of breath.  Was feeling well on the day prior to admission but awoke overnight profoundly short of breath.  Home O2 sat was 90%.  She was placed on  BiPAP in the ED with persistent tachycardia, increased WOB with moderate air movement. Hs troponin was elevated at 42 >>272.  Wore ZIO monitor for dizziness.  Elevated troponin felt to be demand ischemia in the setting of COPD exacerbation.  Metoprolol was changed to bisoprolol.  Cardiac monitor revealed 1 run of VT lasting 43.4 seconds with max HR 72 bpm that occurred on 09/17/2021, however we were not notified until 09/25/2021.  She was referred to EP.  Return admission 6/15-6/20/23 with recurrent COPD and mild CHF.  LVEF normal on echo. GDMT limited by hypotension. At follow-up in cardiology clinic, multiple recent admissions were discussed. She was given parameters for BP monitoring and CHF management  Spironolactone 12.5 mg daily was added.   Seen by Dr. Ladona Ridgel on 10/11/21 for NSVT. Watchful waiting and continuation of beta blocker were advised.   Last cardiology clinic visit was 07/14/22 with Dr. Clifton James. He had previously advised a trial of holding spironolactone and resuming losartan to see if dizziness improved, however there was no improvement and she resumed spironolactone 12.5 mg daily. She stopped losartan. She reported continued dizziness and 2 syncopal events the previous month.  She had been seen by Malena Peer, [facility] for management of hyperlipidemia with no improvement in lipids on rosuvastatin 5 mg daily and ezetimibe 10 mg daily.  She was advised to increase rosuvastatin to 20 mg daily.  30-day event monitor was initiated and she was advised to stop spironolactone.  Monitor completed 08/20/2022 revealed sinus rhythm with rare PACs and PVCs, one 4 beat run of VT, no atrial fibrillation or AV block.  Since that time, she has  had 3 hospital admissions. Cardiology consulted 09/11/22 for elevated troponin. TTE 09/11/22 revealed LVEF 35-40%, no rwma, G1DD, normal RV, no significant valve disease.   Carotid duplex 09/22/22 revealed minimal plaque on bilaterally.   Seen in clinic by me on 01/06/2023,  accompanied by her husband for follow-up. She is traveling in a wheelchair, this is the first time she has been outside of her house except on her deck since June. She is gradually increasing activity at home, has been working with PT. Feels that she is retaining fluid. Weight has increased 10 lbs since hospital discharge. Usually has hand and abdominal swelling, no leg edema. She is sleeping well, no orthopnea or PND. Does not get significant output with Lasix. On one occasion she took 2 additional doses of Lasix 40 mg within a 24 hour period and had little improvement. Has good appetite, admits she may be eating more. She does not closely follow dietary sodium. Does limit fluids to 2L daily.        History of Present Illness: .   Kim [name] is a very pleasant 68 y.o. female who is here today with her husband for follow-up. She reports a weight gain of 20 pounds since July. Admits to having a voracious appetite. Has been on chronic prednisone for many years.  She has not seen any swelling in her hands or feet but feels like she might have fluid buildup. She also reports lightheadedness and episodes of blacking out, especially upon standing. This has resulted in falls and skin injuries.  She was advised by another healthcare provider to discontinue bisoprolol on 04/22/23. She reports a feeling of heaviness in her chest much of the time. Her heart rate often increases to around 120 beats per minute after minimal activity, such as getting up from the toilet and walking a few steps. She also mentions that her heart rate was as low as 48 beats per minute the previous night.  She is not able to be very active due to lightheadedness and presyncope/syncope.  We had previously tried torsemide to see if she had improvement in diuresis, however she went back to furosemide due to significant cramping.  She reported taking furosemide 80 mg daily, however later she reported to the CMA that she takes it only as  needed.  ROS: See HPI       Studies Reviewed: .         Risk Assessment/Calculations:             Physical Exam:   VS:  BP 102/60 (BP Location: Left Arm, Patient Position: Sitting, Cuff Size: Normal)   Pulse 95   Resp 16   Ht 5' 1.5" (1.562 m)   Wt 159 lb 12.8 oz (72.5 kg)   SpO2 95% Comment: 3L  BMI 29.70 kg/m    Wt Readings from Last 3 Encounters:  04/23/23 159 lb 12.8 oz (72.5 kg)  04/16/23 158 lb (71.7 kg)  02/05/23 148 lb (67.1 kg)    GEN: chronically ill appearing in no acute distress NECK: No JVD; No carotid bruits CARDIAC: RRR, no murmurs, rubs, gallops RESPIRATORY:  Diminished breath sounds bilaterally without rales, wheezing or rhonchi  ABDOMEN: Soft, non-tender, non-distended EXTREMITIES:  No edema; No deformity     ASSESSMENT AND PLAN: .    Chronic HFrEF/NICM: TTE 09/11/22 with LVEF 35-40%, reduced from 60-65% in June 2023. History of Takotsubo cardiomyopathy in 2017 and 2022 in setting of COPD exacerbations. No significant CAD on cath. Approximate 20  lb weight gain since June. Feels like she has fluid weight but also admits to voracious appetite. She has chronic dyspnea. Is having orthopnea but no PND. On exam, no evidence of abdominal swelling, JVD, or edema in extremities. Diminished breath sounds bilaterally but no crackles.  GDMT has been limited by hypotension and history of syncope. She is discontinuing bisoprolol in the setting of bradycardia and lightheadedness. She did not tolerate torsemide 20 mg daily.  Continue daily weight and fluid restrictions to 2 L. Low sodium diet emphasized. We will get BMP and BNP today for further guidance on fluid management.  Continue low-dose spironolactone.  I have advised her to continue Lasix 80 mg daily, however when she was being discharged she reported she is only taking this as needed. Will await BNP and BMP for further advisement.   Syncope: She reports frequent episodes of "blacking out" upon standing.  Her husband  states he will be holding onto her and she will go limp in his arms.  Usually out for a few seconds.  BP is soft, likely orthostatic hypotension. We will d/c bisoprolol to see if this improves symptoms.  Encouraged her to do seated exercises such as moving her arms and legs and to continue to move with caution.   CAD: Mild plaquing of proximal LAD on cath 11/2020.  Symptoms of cardiomyopathy felt to be secondary to stress/COPD. She denies chest pain. Chronic dyspnea is stable, however she is having orthopnea. Will check BNP for volume status. No other symptoms concerning for angina. No indication for further ischemic evaluation at this time. No bleeding concerns. Continue aspirin, ezetimibe, rosuvastatin.  COPD/Chronic respiratory failure: Multiple exacerbations June-July 2024. Seen by Dr. Vassie Loll, pulmonologist on 04/16/23. Dyspnea has been stable but she is very limited with activity. No acute concerns today. Management per pulmonology.   Hyperlipidemia LDL goal < 70: Lipid panel completed 08/25/22 with total cholesterol 156, HDL 71, LDL 54, and triglycerides 284. Continue ezetimibe and rosuvastatin.  Hypertension: BP is soft today. She is having possible syncope when she is standing.  She was advised by another provider to discontinue bisoprolol.  I agree with the plan to hold bisoprolol and use prn for sustained HR > 110 bpm.         Disposition: 6 months with Dr. Clifton James  Signed, Eligha Bridegroom, NP-C

## 2023-04-23 ENCOUNTER — Encounter: Payer: Self-pay | Admitting: Nurse Practitioner

## 2023-04-23 ENCOUNTER — Encounter (HOSPITAL_BASED_OUTPATIENT_CLINIC_OR_DEPARTMENT_OTHER): Payer: Self-pay

## 2023-04-23 ENCOUNTER — Ambulatory Visit: Payer: Medicare Other | Attending: Nurse Practitioner | Admitting: Nurse Practitioner

## 2023-04-23 VITALS — BP 102/60 | HR 95 | Resp 16 | Ht 61.5 in | Wt 159.8 lb

## 2023-04-23 DIAGNOSIS — I5042 Chronic combined systolic (congestive) and diastolic (congestive) heart failure: Secondary | ICD-10-CM | POA: Diagnosis not present

## 2023-04-23 DIAGNOSIS — I2510 Atherosclerotic heart disease of native coronary artery without angina pectoris: Secondary | ICD-10-CM

## 2023-04-23 DIAGNOSIS — E785 Hyperlipidemia, unspecified: Secondary | ICD-10-CM | POA: Diagnosis present

## 2023-04-23 DIAGNOSIS — J9611 Chronic respiratory failure with hypoxia: Secondary | ICD-10-CM | POA: Diagnosis present

## 2023-04-23 DIAGNOSIS — I428 Other cardiomyopathies: Secondary | ICD-10-CM

## 2023-04-23 DIAGNOSIS — J9612 Chronic respiratory failure with hypercapnia: Secondary | ICD-10-CM | POA: Insufficient documentation

## 2023-04-23 DIAGNOSIS — I10 Essential (primary) hypertension: Secondary | ICD-10-CM | POA: Diagnosis present

## 2023-04-23 DIAGNOSIS — R55 Syncope and collapse: Secondary | ICD-10-CM | POA: Diagnosis present

## 2023-04-23 MED ORDER — FUROSEMIDE 40 MG PO TABS: 80.0000 mg | ORAL_TABLET | Freq: Every day | ORAL | 3 refills | Status: DC

## 2023-04-23 NOTE — Patient Instructions (Signed)
Medication Instructions:   Your physician recommends that you continue on your current medications as directed. Please refer to the Current Medication list given to you today.   *If you need a refill on your cardiac medications before your next appointment, please call your pharmacy*   Lab Work:  TODAY!!!! PRO BNP/BMET  If you have labs (blood work) drawn today and your tests are completely normal, you will receive your results only by: MyChart Message (if you have MyChart) OR A paper copy in the mail If you have any lab test that is abnormal or we need to change your treatment, we will call you to review the results.   Testing/Procedures:  None ordered.   Follow-Up: At [facility], you and your health needs are our priority.  As part of our continuing mission to provide you with exceptional heart care, we have created designated Provider Care Teams.  These Care Teams include your primary Cardiologist (physician) and Advanced Practice Providers (APPs -  Physician Assistants and Nurse Practitioners) who all work together to provide you with the care you need, when you need it.  We recommend signing up for the patient portal called "MyChart".  Sign up information is provided on this After Visit Summary.  MyChart is used to connect with patients for Virtual Visits (Telemedicine).  Patients are able to view lab/test results, encounter notes, upcoming appointments, etc.  Non-urgent messages can be sent to your provider as well.   To learn more about what you can do with MyChart, go to [email or web address].    Your next appointment:   6 month(s)  Provider:   Verne Carrow, MD  or Eligha Bridegroom, NP         Other Instructions  Your physician wants you to follow-up in: 6 months.  You will receive a reminder letter in the mail two months in advance. If you don't receive a letter, please call our office to schedule the follow-up appointment.    1st Floor: -  Lobby - Registration  - Pharmacy  - Lab - Cafe  2nd Floor: - PV Lab - Diagnostic Testing (echo, CT, nuclear med)  3rd Floor: - Vacant  4th Floor: - TCTS (cardiothoracic surgery) - AFib Clinic - Structural Heart Clinic - Vascular Surgery  - Vascular Ultrasound  5th Floor: - [facility] Cardiology (general and EP) - Clinical Pharmacy for coumadin, hypertension, lipid, weight-loss medications, and med management appointments    Valet parking services will be available as well.

## 2023-04-24 LAB — BASIC METABOLIC PANEL
BUN/Creatinine Ratio: 19 (ref 12–28)
BUN: 24 mg/dL (ref 8–27)
CO2: 24 mmol/L (ref 20–29)
Calcium: 9.5 mg/dL (ref 8.7–10.3)
Chloride: 101 mmol/L (ref 96–106)
Creatinine, Ser: 1.26 mg/dL — ABNORMAL HIGH (ref 0.57–1.00)
Glucose: 120 mg/dL — ABNORMAL HIGH (ref 70–99)
Potassium: 4.5 mmol/L (ref 3.5–5.2)
Sodium: 144 mmol/L (ref 134–144)
eGFR: 46 mL/min/{1.73_m2} — ABNORMAL LOW (ref >59)

## 2023-04-24 LAB — PRO B NATRIURETIC PEPTIDE: NT-Pro BNP: 337 pg/mL — ABNORMAL HIGH (ref 0–301)

## 2023-04-27 ENCOUNTER — Other Ambulatory Visit: Payer: Self-pay | Admitting: *Deleted

## 2023-04-27 MED ORDER — POTASSIUM CHLORIDE CRYS ER 20 MEQ PO TBCR: 20.0000 meq | EXTENDED_RELEASE_TABLET | ORAL | Status: DC | PRN

## 2023-04-30 MED ORDER — PAROXETINE HCL 40 MG PO TABS: 20.0000 mg | ORAL_TABLET | Freq: Every morning | ORAL | 1 refills | Status: AC

## 2023-05-11 ENCOUNTER — Other Ambulatory Visit: Payer: Self-pay

## 2023-05-11 ENCOUNTER — Emergency Department (HOSPITAL_COMMUNITY): Payer: Medicare Other

## 2023-05-11 ENCOUNTER — Emergency Department (HOSPITAL_COMMUNITY)
Admission: EM | Admit: 2023-05-11 | Discharge: 2023-05-11 | Disposition: A | Discharge status: Home / Self Care | Payer: Medicare Other | Source: Home / Self Care | Attending: Emergency Medicine | Admitting: Emergency Medicine

## 2023-05-11 DIAGNOSIS — J449 Chronic obstructive pulmonary disease, unspecified: Secondary | ICD-10-CM | POA: Insufficient documentation

## 2023-05-11 DIAGNOSIS — I509 Heart failure, unspecified: Secondary | ICD-10-CM | POA: Insufficient documentation

## 2023-05-11 DIAGNOSIS — J441 Chronic obstructive pulmonary disease with (acute) exacerbation: Secondary | ICD-10-CM | POA: Diagnosis not present

## 2023-05-11 DIAGNOSIS — Z20822 Contact with and (suspected) exposure to covid-19: Secondary | ICD-10-CM | POA: Insufficient documentation

## 2023-05-11 DIAGNOSIS — Z91040 Latex allergy status: Secondary | ICD-10-CM | POA: Insufficient documentation

## 2023-05-11 DIAGNOSIS — Z9981 Dependence on supplemental oxygen: Secondary | ICD-10-CM | POA: Insufficient documentation

## 2023-05-11 DIAGNOSIS — R339 Retention of urine, unspecified: Secondary | ICD-10-CM | POA: Insufficient documentation

## 2023-05-11 DIAGNOSIS — K644 Residual hemorrhoidal skin tags: Secondary | ICD-10-CM | POA: Insufficient documentation

## 2023-05-11 DIAGNOSIS — B029 Zoster without complications: Secondary | ICD-10-CM | POA: Insufficient documentation

## 2023-05-11 DIAGNOSIS — Z7982 Long term (current) use of aspirin: Secondary | ICD-10-CM | POA: Insufficient documentation

## 2023-05-11 DIAGNOSIS — K649 Unspecified hemorrhoids: Secondary | ICD-10-CM

## 2023-05-11 LAB — CBC WITH DIFFERENTIAL/PLATELET
Abs Immature Granulocytes: 0.11 10*3/uL — ABNORMAL HIGH (ref 0.00–0.07)
Basophils Absolute: 0.1 10*3/uL (ref 0.0–0.1)
Basophils Relative: 1 %
Eosinophils Absolute: 0.2 10*3/uL (ref 0.0–0.5)
Eosinophils Relative: 2 %
HCT: 37.6 % (ref 36.0–46.0)
Hemoglobin: 11.9 g/dL — ABNORMAL LOW (ref 12.0–15.0)
Immature Granulocytes: 1 %
Lymphocytes Relative: 25 %
Lymphs Abs: 2.9 10*3/uL (ref 0.7–4.0)
MCH: 30.9 pg (ref 26.0–34.0)
MCHC: 31.6 g/dL (ref 30.0–36.0)
MCV: 97.7 fL (ref 80.0–100.0)
Monocytes Absolute: 1.1 10*3/uL — ABNORMAL HIGH (ref 0.1–1.0)
Monocytes Relative: 9 %
Neutro Abs: 7.3 10*3/uL (ref 1.7–7.7)
Neutrophils Relative %: 62 %
Platelets: 264 10*3/uL (ref 150–400)
RBC: 3.85 MIL/uL — ABNORMAL LOW (ref 3.87–5.11)
RDW: 16.1 % — ABNORMAL HIGH (ref 11.5–15.5)
WBC: 11.7 10*3/uL — ABNORMAL HIGH (ref 4.0–10.5)
nRBC: 0 % (ref 0.0–0.2)

## 2023-05-11 LAB — TROPONIN I (HIGH SENSITIVITY)
Troponin I (High Sensitivity): 22 ng/L — ABNORMAL HIGH (ref <18)
Troponin I (High Sensitivity): 23 ng/L — ABNORMAL HIGH (ref <18)

## 2023-05-11 LAB — URINALYSIS, W/ REFLEX TO CULTURE (INFECTION SUSPECTED)
Bilirubin Urine: NEGATIVE
Glucose, UA: NEGATIVE mg/dL
Hgb urine dipstick: NEGATIVE
Ketones, ur: NEGATIVE mg/dL
Leukocytes,Ua: NEGATIVE
Nitrite: NEGATIVE
Protein, ur: NEGATIVE mg/dL
Specific Gravity, Urine: 1.02 (ref 1.005–1.030)
pH: 6 (ref 5.0–8.0)

## 2023-05-11 LAB — RESP PANEL BY RT-PCR (RSV, FLU A&B, COVID)  RVPGX2
Influenza A by PCR: NEGATIVE
Influenza B by PCR: NEGATIVE
Resp Syncytial Virus by PCR: NEGATIVE
SARS Coronavirus 2 by RT PCR: NEGATIVE

## 2023-05-11 LAB — COMPREHENSIVE METABOLIC PANEL
ALT: 25 U/L (ref 0–44)
AST: 29 U/L (ref 15–41)
Albumin: 3.3 g/dL — ABNORMAL LOW (ref 3.5–5.0)
Alkaline Phosphatase: 48 U/L (ref 38–126)
Anion gap: 11 (ref 5–15)
BUN: 23 mg/dL (ref 8–23)
CO2: 25 mmol/L (ref 22–32)
Calcium: 9.4 mg/dL (ref 8.9–10.3)
Chloride: 106 mmol/L (ref 98–111)
Creatinine, Ser: 1.55 mg/dL — ABNORMAL HIGH (ref 0.44–1.00)
GFR, Estimated: 36 mL/min — ABNORMAL LOW (ref >60)
Glucose, Bld: 122 mg/dL — ABNORMAL HIGH (ref 70–99)
Potassium: 4.2 mmol/L (ref 3.5–5.1)
Sodium: 142 mmol/L (ref 135–145)
Total Bilirubin: 0.6 mg/dL (ref 0.0–1.2)
Total Protein: 5.6 g/dL — ABNORMAL LOW (ref 6.5–8.1)

## 2023-05-11 LAB — I-STAT CG4 LACTIC ACID, ED
Lactic Acid, Venous: 1.4 mmol/L (ref 0.5–1.9)
Lactic Acid, Venous: 1.6 mmol/L (ref 0.5–1.9)

## 2023-05-11 LAB — BRAIN NATRIURETIC PEPTIDE: B Natriuretic Peptide: 188.6 pg/mL — ABNORMAL HIGH (ref 0.0–100.0)

## 2023-05-11 MED ORDER — HYDROCODONE-ACETAMINOPHEN 5-325 MG PO TABS
2.0000 | ORAL_TABLET | Freq: Once | ORAL | Status: AC
  Administered 2023-05-11: 2 via ORAL
  Filled 2023-05-11: qty 2

## 2023-05-11 MED ORDER — VALACYCLOVIR HCL 1 G PO TABS: 1000.0000 mg | ORAL_TABLET | Freq: Three times a day (TID) | ORAL | 0 refills | Status: DC

## 2023-05-11 MED ORDER — FUROSEMIDE 10 MG/ML IJ SOLN
80.0000 mg | Freq: Once | INTRAMUSCULAR | Status: AC
  Administered 2023-05-11: 80 mg via INTRAVENOUS
  Filled 2023-05-11: qty 8

## 2023-05-11 MED ORDER — HYDROCORTISONE (PERIANAL) 2.5 % EX CREA: 1.0000 | TOPICAL_CREAM | Freq: Two times a day (BID) | CUTANEOUS | 0 refills | Status: DC

## 2023-05-11 NOTE — ED Notes (Signed)
Leg bag for foley applied and patient was instructed on how to empty bag

## 2023-05-11 NOTE — ED Provider Notes (Signed)
[place] EMERGENCY DEPARTMENT AT [facility] Provider Note   CSN: [id number] Arrival date & time: 05/11/23  0250     History  Chief Complaint  Patient presents with   Shortness of Breath    Pt BIB EMS for worsening SOB and urinary retention. Pt wears 2L O2 at home at baseline but has required more oxygen since yesterday. Pt w/ known hx of COPD and CHF. Pt also states that she has not urinated in 2 days. Pt alert and oriented x4, in NAD     Kim [name] is a 68 y.o. female.  Patient presents to the emergency department for evaluation of difficulty breathing.  Patient reports a history of COPD and congestive heart failure, on daily diuretics.  Patient uses oxygen 2 L by nasal cannula continuously.  Patient was extremely dyspneic when EMS arrived at her home.  Patient reports that she has not urinated in 2 days.  She reports like her bladder is very full and painful.  She did not take Lasix today because of her inability to urinate.  Patient also concerned about a rash on her right buttock that she is concerned might be an allergic reaction.       Home Medications Prior to Admission medications   Medication Sig Start Date End Date Taking? Authorizing Provider  hydrocortisone (ANUSOL-HC) 2.5 % rectal cream Place 1 Application rectally 2 (two) times daily. 05/11/23  Yes Gale Klar, Canary Brim, MD  valACYclovir (VALTREX) 1000 MG tablet Take 1 tablet (1,000 mg total) by mouth 3 (three) times daily. 05/11/23  Yes Tameron Lama, Canary Brim, MD  albuterol (VENTOLIN HFA) 108 (90 Base) MCG/ACT inhaler INHALE 2 PUFFS INTO THE LUNGS EVERY 4 TO 6 HOURS AS NEEDED FOR SHORTNESS OF BREATH OR WHEEZING 02/02/23   Oretha Milch, MD  aspirin EC 81 MG EC tablet Take 1 tablet (81 mg total) by mouth daily. 07/08/15   Rhetta Mura, MD  budesonide-formoterol (SYMBICORT) 160-4.5 MCG/ACT inhaler Inhale 2 puffs into the lungs 2 (two) times daily. 10/15/22 04/23/23  Miguel Rota, MD   butalbital-acetaminophen-caffeine (FIORICET) 50-325-40 MG tablet Take 1 tablet by mouth every 4 (four) hours as needed for headache. TAKE 1 TABLET BY MOUTH EVERY 4 HOURS AS NEEDED FOR HEADACHETAKE 1 TABLET BY MOUTH EVERY 4 HOURS AS NEEDED FOR HEADACHE 09/24/22   Nafziger, Kandee Keen, NP  cetirizine (ZYRTEC) 10 MG tablet Take 10 mg by mouth daily.    [provider]  Cholecalciferol (VITAMIN D3 PO) Take 5,000 Units by mouth daily.    [provider]  Cyanocobalamin (VITAMIN B-12 PO) Take 1 tablet by mouth daily.    [provider]  doxycycline (VIBRA-TABS) 100 MG tablet Take 1 tablet (100 mg total) by mouth 2 (two) times daily. 11/06/22   Oretha Milch, MD  ezetimibe (ZETIA) 10 MG tablet TAKE 1 TABLET(10 MG) BY MOUTH DAILY 01/06/23   Swinyer, Zachary George, NP  furosemide (LASIX) 40 MG tablet Take 2 tablets (80 mg total) by mouth daily. 04/23/23   Swinyer, Zachary George, NP  HYDROcodone-acetaminophen (NORCO) 7.5-325 MG tablet Take 1 tablet by mouth every 6 (six) hours as needed for moderate pain (pain score 4-6). 02/20/23   Nafziger, Kandee Keen, NP  ipratropium-albuterol (DUONEB) 0.5-2.5 (3) MG/3ML SOLN Take 3 mLs by nebulization every 4 (four) hours as needed. 10/15/22 04/23/23  Amin, Ankit C, MD  magnesium oxide (MAG-OX) 400 MG tablet Take 400 mg by mouth daily as needed (constipation).    [provider]  MUCINEX MAXIMUM STRENGTH 1200 MG TB12 Take 1,200 mg by mouth every 12 (twelve) hours.    [provider]  nitroGLYCERIN (NITROSTAT) 0.4 MG SL tablet Place 1 tablet (0.4 mg total) under the tongue every 5 (five) minutes as needed for chest pain. 10/24/20   Kathleene Hazel, MD  nystatin (MYCOSTATIN) 100000 UNIT/ML suspension Take 5 mLs (500,000 Units total) by mouth 4 (four) times daily. 11/04/22   Nafziger, Kandee Keen, NP  OXYGEN Inhale 2-3 L/min into the lungs continuous.    [provider]  PARoxetine (PAXIL) 40 MG tablet Take 0.5 tablets (20 mg total) by mouth in  the morning. TAKE 1 TABLET(40 MG) BY MOUTH EVERY MORNING 04/30/23   Nafziger, Kandee Keen, NP  potassium chloride SA (KLOR-CON M) 20 MEQ tablet Take 1 tablet (20 mEq total) by mouth as needed. When you take lasix. 04/27/23   Swinyer, Zachary George, NP  predniSONE (DELTASONE) 10 MG tablet Take 1 tablet (10 mg total) by mouth daily with breakfast. 03/20/23   Luciano Cutter, MD  rosuvastatin (CRESTOR) 20 MG tablet Take 1 tablet (20 mg total) by mouth daily. 07/14/22   Kathleene Hazel, MD  spironolactone (ALDACTONE) 25 MG tablet TAKE 1/2 TABLET(12.5 MG) BY MOUTH DAILY 12/30/22   Kathleene Hazel, MD      Allergies    Tape, Daliresp [roflumilast], Cozaar [losartan], Dupixent [dupilumab], Latex, Morphine and codeine, Penicillins, Statins, Zestril [lisinopril], and Omnicef [cefdinir]    Review of Systems   Review of Systems  Physical Exam Updated Vital Signs BP (!) 126/56 (BP Location: Right Arm)   Pulse 75   Temp 98.4 F (36.9 C) (Oral)   Resp (!) 21   SpO2 98%  Physical Exam Vitals and nursing note reviewed.  Constitutional:      General: She is not in acute distress.    Appearance: She is well-developed.  HENT:     Head: Normocephalic and atraumatic.     Mouth/Throat:     Mouth: Mucous membranes are moist.  Eyes:     General: Vision grossly intact. Gaze aligned appropriately.     Extraocular Movements: Extraocular movements intact.     Conjunctiva/sclera: Conjunctivae normal.  Cardiovascular:     Rate and Rhythm: Normal rate. Rhythm irregular. Frequent Extrasystoles are present.    Pulses: Normal pulses.     Heart sounds: Normal heart sounds, S1 normal and S2 normal. No murmur heard.    No friction rub. No gallop.  Pulmonary:     Effort: Accessory muscle usage and prolonged expiration present. No respiratory distress.     Breath sounds: Decreased air movement present.  Abdominal:     General: Bowel sounds are normal.     Palpations: Abdomen is soft.     Tenderness: There  is no abdominal tenderness. There is no guarding or rebound.     Hernia: No hernia is present.  Genitourinary:    Comments: Multiple external hemorrhoids, no bleeding, no thrombosis Musculoskeletal:        General: No swelling.     Cervical back: Full passive range of motion without pain, normal range of motion and neck supple. No spinous process tenderness or muscular tenderness. Normal range of motion.     Right lower leg: 1+ Pitting Edema present.     Left lower leg: 1+ Pitting Edema present.  Skin:    General: Skin is warm and dry.     Capillary Refill: Capillary refill takes less than 2 seconds.     Findings:  Rash (R buttock) present. No ecchymosis, erythema or wound.  Neurological:     General: No focal deficit present.     Mental Status: She is alert and oriented to person, place, and time.     GCS: GCS eye subscore is 4. GCS verbal subscore is 5. GCS motor subscore is 6.     Cranial Nerves: Cranial nerves 2-12 are intact.     Sensory: Sensation is intact.     Motor: Motor function is intact.     Coordination: Coordination is intact.  Psychiatric:        Attention and Perception: Attention normal.        Mood and Affect: Mood normal.        Speech: Speech normal.        Behavior: Behavior normal.     ED Results / Procedures / Treatments   Labs (all labs ordered are listed, but only abnormal results are displayed) Labs Reviewed  CBC WITH DIFFERENTIAL/PLATELET - Abnormal; Notable for the following components:      Result Value   WBC 11.7 (*)    RBC 3.85 (*)    Hemoglobin 11.9 (*)    RDW 16.1 (*)    Monocytes Absolute 1.1 (*)    Abs Immature Granulocytes 0.11 (*)    All other components within normal limits  COMPREHENSIVE METABOLIC PANEL - Abnormal; Notable for the following components:   Glucose, Bld 122 (*)    Creatinine, Ser 1.55 (*)    Total Protein 5.6 (*)    Albumin 3.3 (*)    GFR, Estimated 36 (*)    All other components within normal limits  BRAIN  NATRIURETIC PEPTIDE - Abnormal; Notable for the following components:   B Natriuretic Peptide 188.6 (*)    All other components within normal limits  URINALYSIS, W/ REFLEX TO CULTURE (INFECTION SUSPECTED) - Abnormal; Notable for the following components:   Bacteria, UA RARE (*)    All other components within normal limits  TROPONIN I (HIGH SENSITIVITY) - Abnormal; Notable for the following components:   Troponin I (High Sensitivity) 22 (*)    All other components within normal limits  CULTURE, BLOOD (SINGLE)  RESP PANEL BY RT-PCR (RSV, FLU A&B, COVID)  [id number]  I-STAT CG4 LACTIC ACID, ED  I-STAT CG4 LACTIC ACID, ED  TROPONIN I (HIGH SENSITIVITY)    EKG None 88 BPM Sinus rhythm Multiple premature complexes, vent & supraven Right axis deviation Low voltage, precordial leads Consider anterior infarct  Radiology DG Chest Port 1 View Result Date: 05/11/2023 CLINICAL DATA:  Dyspnea EXAM: PORTABLE CHEST 1 VIEW COMPARISON:  None Available. FINDINGS: The heart size and mediastinal contours are within normal limits. Both lungs are clear. The visualized skeletal structures are unremarkable. IMPRESSION: No active disease. Electronically Signed   By: Helyn Numbers M.D.   On: 05/11/2023 03:25    Procedures Procedures    Medications Ordered in ED Medications  furosemide (LASIX) injection 80 mg (has no administration in time range)  HYDROcodone-acetaminophen (NORCO/VICODIN) 5-325 MG per tablet 2 tablet (2 tablets Oral Given 05/11/23 0518)    ED Course/ Medical Decision Making/ A&P                                 Medical Decision Making Amount and/or Complexity of Data Reviewed Labs: ordered. Radiology: ordered.  Risk Prescription drug management.   Patient presents to the emergency department with multiple complaints.  Patient  is here because of acute shortness of breath.  She has a history of CHF and COPD.  Patient is chronically on oxygen.  Patient reports that she has not put  out urine in 2 days.  Part of this is because she has developed a painful rash on her buttock and is experiencing external hemorrhoids that are painful.  This is caused her to not want to sit down to go to to the bathroom.  She therefore has not taken her Lasix.  A bladder scan was performed at arrival and it did show a full bladder.  Patient had a Foley catheter placed and was drained of 650 mL of urine.  Unknown reason for urinary retention.  Patient moving lower extremities without difficulty, normal strength and sensation, no saddle anesthesia.  Rectal exam reveals multiple nonthrombosed hemorrhoids.  No bleeding.  Treat with Anusol HC topically.  Patient with rash on right buttock.  Erythematous base with some vesicles consistent with shingles.  Treat with Valtrex.  Patient's BMP is at baseline.  X-ray without overt failure.  Suspect that she is having some early stages as she did not take her Lasix as outlined above.  Will give IV dose of Lasix and reevaluate.  Signed out to oncoming ER physician.  Suspect she will be able to be discharged.  At this point she likely needs to be discharged with Foley catheter and follow-up with urology.        Final Clinical Impression(s) / ED Diagnoses Final diagnoses:  Acute on chronic congestive heart failure, unspecified heart failure type (HCC)  Herpes zoster without complication  Hemorrhoids, unspecified hemorrhoid type  Urinary retention    Rx / DC Orders ED Discharge Orders          Ordered    valACYclovir (VALTREX) 1000 MG tablet  3 times daily        05/11/23 0637    hydrocortisone (ANUSOL-HC) 2.5 % rectal cream  2 times daily        05/11/23 0637              Gilda Crease, MD 05/11/23 [phone]

## 2023-05-11 NOTE — ED Provider Notes (Signed)
Care of the patient assumed at signout.  She has produced roughly 1 L of urine, delta troponin is negligible, patient amenable to discharge.   Gerhard Munch, MD 05/11/23 1013

## 2023-05-11 NOTE — Discharge Instructions (Addendum)
Be sure to follow-up with your physician.  Return here for concerning changes in your condition. 

## 2023-05-12 ENCOUNTER — Emergency Department (HOSPITAL_COMMUNITY): Payer: Medicare Other

## 2023-05-12 ENCOUNTER — Telehealth: Payer: Self-pay

## 2023-05-12 ENCOUNTER — Inpatient Hospital Stay (HOSPITAL_COMMUNITY)
Admission: EM | Admit: 2023-05-12 | Discharge: 2023-05-18 | DRG: 191 | Disposition: A | Discharge status: Home Health | Payer: Medicare Other | Attending: Obstetrics and Gynecology | Admitting: Obstetrics and Gynecology

## 2023-05-12 ENCOUNTER — Ambulatory Visit: Payer: Self-pay | Admitting: Adult Health

## 2023-05-12 ENCOUNTER — Encounter (HOSPITAL_COMMUNITY): Payer: Self-pay | Admitting: Emergency Medicine

## 2023-05-12 ENCOUNTER — Other Ambulatory Visit: Payer: Self-pay

## 2023-05-12 ENCOUNTER — Telehealth: Payer: Self-pay | Admitting: Cardiovascular Disease

## 2023-05-12 DIAGNOSIS — K5900 Constipation, unspecified: Secondary | ICD-10-CM | POA: Diagnosis present

## 2023-05-12 DIAGNOSIS — R339 Retention of urine, unspecified: Secondary | ICD-10-CM | POA: Diagnosis present

## 2023-05-12 DIAGNOSIS — N179 Acute kidney failure, unspecified: Secondary | ICD-10-CM | POA: Diagnosis present

## 2023-05-12 DIAGNOSIS — Z91040 Latex allergy status: Secondary | ICD-10-CM

## 2023-05-12 DIAGNOSIS — E875 Hyperkalemia: Secondary | ICD-10-CM | POA: Diagnosis not present

## 2023-05-12 DIAGNOSIS — Z66 Do not resuscitate: Secondary | ICD-10-CM | POA: Diagnosis present

## 2023-05-12 DIAGNOSIS — Z1152 Encounter for screening for COVID-19: Secondary | ICD-10-CM

## 2023-05-12 DIAGNOSIS — J441 Chronic obstructive pulmonary disease with (acute) exacerbation: Principal | ICD-10-CM | POA: Diagnosis present

## 2023-05-12 DIAGNOSIS — D72829 Elevated white blood cell count, unspecified: Secondary | ICD-10-CM | POA: Diagnosis not present

## 2023-05-12 DIAGNOSIS — Z87891 Personal history of nicotine dependence: Secondary | ICD-10-CM

## 2023-05-12 DIAGNOSIS — B029 Zoster without complications: Secondary | ICD-10-CM | POA: Diagnosis present

## 2023-05-12 DIAGNOSIS — Z87442 Personal history of urinary calculi: Secondary | ICD-10-CM

## 2023-05-12 DIAGNOSIS — J9611 Chronic respiratory failure with hypoxia: Secondary | ICD-10-CM | POA: Diagnosis present

## 2023-05-12 DIAGNOSIS — Z7951 Long term (current) use of inhaled steroids: Secondary | ICD-10-CM

## 2023-05-12 DIAGNOSIS — Z8249 Family history of ischemic heart disease and other diseases of the circulatory system: Secondary | ICD-10-CM

## 2023-05-12 DIAGNOSIS — Z7952 Long term (current) use of systemic steroids: Secondary | ICD-10-CM

## 2023-05-12 DIAGNOSIS — Z9049 Acquired absence of other specified parts of digestive tract: Secondary | ICD-10-CM

## 2023-05-12 DIAGNOSIS — G894 Chronic pain syndrome: Secondary | ICD-10-CM | POA: Diagnosis present

## 2023-05-12 DIAGNOSIS — N1832 Chronic kidney disease, stage 3b: Secondary | ICD-10-CM | POA: Diagnosis present

## 2023-05-12 DIAGNOSIS — N1831 Chronic kidney disease, stage 3a: Secondary | ICD-10-CM | POA: Diagnosis present

## 2023-05-12 DIAGNOSIS — Z79899 Other long term (current) drug therapy: Secondary | ICD-10-CM

## 2023-05-12 DIAGNOSIS — Z91048 Other nonmedicinal substance allergy status: Secondary | ICD-10-CM

## 2023-05-12 DIAGNOSIS — I2510 Atherosclerotic heart disease of native coronary artery without angina pectoris: Secondary | ICD-10-CM | POA: Diagnosis present

## 2023-05-12 DIAGNOSIS — Z993 Dependence on wheelchair: Secondary | ICD-10-CM

## 2023-05-12 DIAGNOSIS — Z9981 Dependence on supplemental oxygen: Secondary | ICD-10-CM

## 2023-05-12 DIAGNOSIS — Z885 Allergy status to narcotic agent status: Secondary | ICD-10-CM

## 2023-05-12 DIAGNOSIS — E785 Hyperlipidemia, unspecified: Secondary | ICD-10-CM | POA: Diagnosis present

## 2023-05-12 DIAGNOSIS — Z90710 Acquired absence of both cervix and uterus: Secondary | ICD-10-CM

## 2023-05-12 DIAGNOSIS — Z8719 Personal history of other diseases of the digestive system: Secondary | ICD-10-CM

## 2023-05-12 DIAGNOSIS — Z7982 Long term (current) use of aspirin: Secondary | ICD-10-CM

## 2023-05-12 DIAGNOSIS — F32A Depression, unspecified: Secondary | ICD-10-CM | POA: Diagnosis present

## 2023-05-12 DIAGNOSIS — Z888 Allergy status to other drugs, medicaments and biological substances status: Secondary | ICD-10-CM

## 2023-05-12 DIAGNOSIS — I5022 Chronic systolic (congestive) heart failure: Secondary | ICD-10-CM | POA: Diagnosis present

## 2023-05-12 DIAGNOSIS — Z8349 Family history of other endocrine, nutritional and metabolic diseases: Secondary | ICD-10-CM

## 2023-05-12 DIAGNOSIS — F419 Anxiety disorder, unspecified: Secondary | ICD-10-CM | POA: Diagnosis present

## 2023-05-12 DIAGNOSIS — K649 Unspecified hemorrhoids: Secondary | ICD-10-CM | POA: Diagnosis present

## 2023-05-12 DIAGNOSIS — Z880 Allergy status to penicillin: Secondary | ICD-10-CM

## 2023-05-12 LAB — COMPREHENSIVE METABOLIC PANEL
ALT: 28 U/L (ref 0–44)
AST: 31 U/L (ref 15–41)
Albumin: 3.7 g/dL (ref 3.5–5.0)
Alkaline Phosphatase: 49 U/L (ref 38–126)
Anion gap: 10 (ref 5–15)
BUN: 22 mg/dL (ref 8–23)
CO2: 28 mmol/L (ref 22–32)
Calcium: 9.3 mg/dL (ref 8.9–10.3)
Chloride: 105 mmol/L (ref 98–111)
Creatinine, Ser: 1.28 mg/dL — ABNORMAL HIGH (ref 0.44–1.00)
GFR, Estimated: 45 mL/min — ABNORMAL LOW (ref >60)
Glucose, Bld: 133 mg/dL — ABNORMAL HIGH (ref 70–99)
Potassium: 4.7 mmol/L (ref 3.5–5.1)
Sodium: 143 mmol/L (ref 135–145)
Total Bilirubin: 0.6 mg/dL (ref 0.0–1.2)
Total Protein: 6.6 g/dL (ref 6.5–8.1)

## 2023-05-12 LAB — CBC WITH DIFFERENTIAL/PLATELET
Abs Immature Granulocytes: 0.05 10*3/uL (ref 0.00–0.07)
Basophils Absolute: 0.1 10*3/uL (ref 0.0–0.1)
Basophils Relative: 1 %
Eosinophils Absolute: 0.1 10*3/uL (ref 0.0–0.5)
Eosinophils Relative: 1 %
HCT: 38.8 % (ref 36.0–46.0)
Hemoglobin: 11.8 g/dL — ABNORMAL LOW (ref 12.0–15.0)
Immature Granulocytes: 1 %
Lymphocytes Relative: 20 %
Lymphs Abs: 1.7 10*3/uL (ref 0.7–4.0)
MCH: 30.3 pg (ref 26.0–34.0)
MCHC: 30.4 g/dL (ref 30.0–36.0)
MCV: 99.7 fL (ref 80.0–100.0)
Monocytes Absolute: 0.9 10*3/uL (ref 0.1–1.0)
Monocytes Relative: 10 %
Neutro Abs: 5.9 10*3/uL (ref 1.7–7.7)
Neutrophils Relative %: 67 %
Platelets: 261 10*3/uL (ref 150–400)
RBC: 3.89 MIL/uL (ref 3.87–5.11)
RDW: 16.5 % — ABNORMAL HIGH (ref 11.5–15.5)
WBC: 8.7 10*3/uL (ref 4.0–10.5)
nRBC: 0 % (ref 0.0–0.2)

## 2023-05-12 MED ORDER — ARFORMOTEROL TARTRATE 15 MCG/2ML IN NEBU
15.0000 ug | INHALATION_SOLUTION | Freq: Two times a day (BID) | RESPIRATORY_TRACT | Status: DC
  Administered 2023-05-12 – 2023-05-18 (×12): 15 ug via RESPIRATORY_TRACT
  Filled 2023-05-12 (×12): qty 2

## 2023-05-12 MED ORDER — ONDANSETRON HCL 4 MG/2ML IJ SOLN
4.0000 mg | Freq: Four times a day (QID) | INTRAMUSCULAR | Status: DC | PRN
  Administered 2023-05-14 – 2023-05-15 (×3): 4 mg via INTRAVENOUS
  Filled 2023-05-12 (×3): qty 2

## 2023-05-12 MED ORDER — BISACODYL 5 MG PO TBEC: 5.0000 mg | DELAYED_RELEASE_TABLET | Freq: Every day | ORAL | Status: DC | PRN

## 2023-05-12 MED ORDER — IPRATROPIUM-ALBUTEROL 0.5-2.5 (3) MG/3ML IN SOLN
3.0000 mL | Freq: Once | RESPIRATORY_TRACT | Status: AC
  Administered 2023-05-12: 3 mL via RESPIRATORY_TRACT
  Filled 2023-05-12: qty 3

## 2023-05-12 MED ORDER — METHYLPREDNISOLONE SODIUM SUCC 40 MG IJ SOLR
40.0000 mg | Freq: Two times a day (BID) | INTRAMUSCULAR | Status: DC
  Administered 2023-05-13 – 2023-05-15 (×6): 40 mg via INTRAVENOUS
  Filled 2023-05-12 (×6): qty 1

## 2023-05-12 MED ORDER — ENOXAPARIN SODIUM 40 MG/0.4ML IJ SOSY
40.0000 mg | PREFILLED_SYRINGE | INTRAMUSCULAR | Status: DC
  Administered 2023-05-15 – 2023-05-16 (×2): 40 mg via SUBCUTANEOUS
  Filled 2023-05-12 (×6): qty 0.4

## 2023-05-12 MED ORDER — FUROSEMIDE 40 MG PO TABS
80.0000 mg | ORAL_TABLET | Freq: Every day | ORAL | Status: DC
  Administered 2023-05-12 – 2023-05-17 (×6): 80 mg via ORAL
  Filled 2023-05-12 (×6): qty 2

## 2023-05-12 MED ORDER — MAGNESIUM SULFATE 2 GM/50ML IV SOLN
2.0000 g | Freq: Once | INTRAVENOUS | Status: AC
  Administered 2023-05-12: 2 g via INTRAVENOUS
  Filled 2023-05-12: qty 50

## 2023-05-12 MED ORDER — ALBUTEROL SULFATE (2.5 MG/3ML) 0.083% IN NEBU
2.5000 mg | INHALATION_SOLUTION | Freq: Once | RESPIRATORY_TRACT | Status: AC
Start: 2023-05-12 — End: 2023-05-12
  Administered 2023-05-12: 2.5 mg via RESPIRATORY_TRACT
  Filled 2023-05-12: qty 3

## 2023-05-12 MED ORDER — ACETAMINOPHEN 650 MG RE SUPP: 650.0000 mg | Freq: Four times a day (QID) | RECTAL | Status: DC | PRN

## 2023-05-12 MED ORDER — ROSUVASTATIN CALCIUM 20 MG PO TABS
20.0000 mg | ORAL_TABLET | Freq: Every day | ORAL | Status: DC
Start: 2023-05-13 — End: 2023-05-18
  Administered 2023-05-13 – 2023-05-18 (×6): 20 mg via ORAL
  Filled 2023-05-12 (×6): qty 1

## 2023-05-12 MED ORDER — HYDROCODONE-ACETAMINOPHEN 7.5-325 MG PO TABS
1.0000 | ORAL_TABLET | Freq: Four times a day (QID) | ORAL | Status: DC | PRN
  Administered 2023-05-12 – 2023-05-18 (×15): 1 via ORAL
  Filled 2023-05-12 (×15): qty 1

## 2023-05-12 MED ORDER — SPIRONOLACTONE 12.5 MG HALF TABLET
12.5000 mg | ORAL_TABLET | Freq: Every day | ORAL | Status: DC
  Administered 2023-05-13 – 2023-05-18 (×6): 12.5 mg via ORAL
  Filled 2023-05-12 (×6): qty 1

## 2023-05-12 MED ORDER — POTASSIUM CHLORIDE ER 10 MEQ PO TBCR
20.0000 meq | EXTENDED_RELEASE_TABLET | Freq: Every day | ORAL | Status: DC
  Administered 2023-05-13 – 2023-05-17 (×5): 20 meq via ORAL
  Filled 2023-05-12 (×10): qty 2

## 2023-05-12 MED ORDER — ASPIRIN 81 MG PO TBEC
81.0000 mg | DELAYED_RELEASE_TABLET | ORAL | Status: DC
  Administered 2023-05-13 – 2023-05-18 (×3): 81 mg via ORAL
  Filled 2023-05-12 (×3): qty 1

## 2023-05-12 MED ORDER — SODIUM CHLORIDE 0.9 % IV SOLN
500.0000 mg | INTRAVENOUS | Status: AC
  Administered 2023-05-12: 500 mg via INTRAVENOUS
  Filled 2023-05-12: qty 5

## 2023-05-12 MED ORDER — PAROXETINE HCL 20 MG PO TABS
40.0000 mg | ORAL_TABLET | Freq: Every day | ORAL | Status: DC
Start: 2023-05-13 — End: 2023-05-18
  Administered 2023-05-13 – 2023-05-18 (×6): 40 mg via ORAL
  Filled 2023-05-12 (×6): qty 2

## 2023-05-12 MED ORDER — ENSIFENTRINE 3 MG/2.5ML IN SUSP
3.0000 mg | Freq: Two times a day (BID) | RESPIRATORY_TRACT | Status: DC
  Administered 2023-05-13: 3 mg via RESPIRATORY_TRACT

## 2023-05-12 MED ORDER — IPRATROPIUM-ALBUTEROL 0.5-2.5 (3) MG/3ML IN SOLN
3.0000 mL | Freq: Four times a day (QID) | RESPIRATORY_TRACT | Status: DC | PRN
  Administered 2023-05-13: 3 mL via RESPIRATORY_TRACT
  Filled 2023-05-12: qty 3

## 2023-05-12 MED ORDER — SODIUM CHLORIDE 0.9% FLUSH
3.0000 mL | Freq: Two times a day (BID) | INTRAVENOUS | Status: DC
  Administered 2023-05-12 – 2023-05-18 (×11): 3 mL via INTRAVENOUS

## 2023-05-12 MED ORDER — METHYLPREDNISOLONE SODIUM SUCC 125 MG IJ SOLR
125.0000 mg | Freq: Once | INTRAMUSCULAR | Status: AC
Start: 2023-05-12 — End: 2023-05-12
  Administered 2023-05-12: 125 mg via INTRAVENOUS
  Filled 2023-05-12: qty 2

## 2023-05-12 MED ORDER — BUDESONIDE 0.25 MG/2ML IN SUSP
0.2500 mg | Freq: Two times a day (BID) | RESPIRATORY_TRACT | Status: DC
Start: 2023-05-12 — End: 2023-05-18
  Administered 2023-05-12 – 2023-05-18 (×12): 0.25 mg via RESPIRATORY_TRACT
  Filled 2023-05-12 (×13): qty 2

## 2023-05-12 MED ORDER — ACETAMINOPHEN 325 MG PO TABS: 650.0000 mg | ORAL_TABLET | Freq: Four times a day (QID) | ORAL | Status: DC | PRN

## 2023-05-12 MED ORDER — GUAIFENESIN ER 600 MG PO TB12
1200.0000 mg | ORAL_TABLET | Freq: Two times a day (BID) | ORAL | Status: DC
  Administered 2023-05-12 – 2023-05-18 (×12): 1200 mg via ORAL
  Filled 2023-05-12 (×12): qty 2

## 2023-05-12 MED ORDER — ONDANSETRON HCL 4 MG PO TABS: 4.0000 mg | ORAL_TABLET | Freq: Four times a day (QID) | ORAL | Status: DC | PRN

## 2023-05-12 MED ORDER — VALACYCLOVIR HCL 500 MG PO TABS
1000.0000 mg | ORAL_TABLET | Freq: Three times a day (TID) | ORAL | Status: DC
  Administered 2023-05-12 – 2023-05-15 (×8): 1000 mg via ORAL
  Filled 2023-05-12 (×14): qty 2

## 2023-05-12 MED ORDER — HYDROCORTISONE (PERIANAL) 2.5 % EX CREA
1.0000 | TOPICAL_CREAM | Freq: Two times a day (BID) | CUTANEOUS | Status: DC
  Administered 2023-05-14 – 2023-05-17 (×6): 1 via RECTAL
  Filled 2023-05-12: qty 28.35

## 2023-05-12 MED ORDER — EZETIMIBE 10 MG PO TABS
10.0000 mg | ORAL_TABLET | Freq: Every day | ORAL | Status: DC
Start: 2023-05-13 — End: 2023-05-18
  Administered 2023-05-13 – 2023-05-18 (×6): 10 mg via ORAL
  Filled 2023-05-12 (×6): qty 1

## 2023-05-12 MED ORDER — AZITHROMYCIN 250 MG PO TABS
500.0000 mg | ORAL_TABLET | Freq: Every day | ORAL | Status: AC
  Administered 2023-05-13 – 2023-05-16 (×4): 500 mg via ORAL
  Filled 2023-05-12 (×4): qty 2

## 2023-05-12 MED ORDER — SENNOSIDES-DOCUSATE SODIUM 8.6-50 MG PO TABS
1.0000 | ORAL_TABLET | Freq: Every evening | ORAL | Status: DC | PRN
  Administered 2023-05-13: 1 via ORAL
  Filled 2023-05-12: qty 1

## 2023-05-12 MED ORDER — ALPRAZOLAM 0.5 MG PO TABS
1.0000 mg | ORAL_TABLET | Freq: Three times a day (TID) | ORAL | Status: DC | PRN
  Administered 2023-05-13 – 2023-05-18 (×12): 1 mg via ORAL
  Filled 2023-05-12 (×12): qty 2

## 2023-05-12 NOTE — Transitions of Care (Post Inpatient/ED Visit) (Signed)
 05/12/2023  Name: Kim [name] MRN: [id number] DOB: [birth year]  Today's TOC FU Call Status: Today's TOC FU Call Status:: Successful TOC FU Call Completed TOC FU Call Complete Date: 05/12/23 Patient's Name and Date of Birth confirmed.  Transition Care Management Follow-up Telephone Call Date of Discharge: 05/11/23 Discharge Facility: [name]) Type of Discharge: Emergency Department Reason for ED Visit: Other: How have you been since you were released from the hospital?: Same Any questions or concerns?: No (at the time of the [facility] patiient was on her way back to the ED)  Items Reviewed: Did you receive and understand the discharge instructions provided?: Yes Medications obtained,verified, and reconciled?: Yes (Medications Reviewed) Any new allergies since your discharge?: No Dietary orders reviewed?: NA Do you have support at home?: Yes  Medications Reviewed Today: Medications Reviewed Today     Reviewed by Cylas Falzone, [place] LABOR, CMA (Certified Medical Assistant) on 05/12/23 at 1609  Med List Status: <None>   Medication Order Taking? Sig Documenting Provider Last Dose Status Informant  albuterol  (VENTOLIN  HFA) 108 (90 Base) MCG/ACT inhaler [id number] No INHALE 2 PUFFS INTO THE LUNGS EVERY 4 TO 6 HOURS AS NEEDED FOR SHORTNESS OF BREATH OR WHEEZING Jude Harden GAILS, MD Taking Active   aspirin  EC 81 MG EC tablet [id number] No Take 1 tablet (81 mg total) by mouth daily. Samtani, Jai-Gurmukh, MD Taking Active Self, Pharmacy Records           Med Note DINO, NEVADA   Dju Oct 04, 2022  2:49 PM)    budesonide -formoterol  (SYMBICORT ) 160-4.5 MCG/ACT inhaler [id number] No Inhale 2 puffs into the lungs 2 (two) times daily. Caleen Burgess BROCKS, MD Taking Expired 04/23/23 2359   butalbital -acetaminophen -caffeine  (FIORICET ) 50-325-40 MG tablet [id number] No Take 1 tablet by mouth every 4 (four) hours as needed for headache. TAKE 1 TABLET BY MOUTH EVERY 4 HOURS AS NEEDED FOR HEADACHETAKE 1 TABLET BY  MOUTH EVERY 4 HOURS AS NEEDED FOR HEADACHE Nafziger, Darleene, NP Taking Active Self, Pharmacy Records  cetirizine (ZYRTEC) 10 MG tablet [id number] No Take 10 mg by mouth daily. [provider] Taking Active Self, Pharmacy Records  Cholecalciferol  (VITAMIN D3 PO) [id number] No Take 5,000 Units by mouth daily. [provider] Taking Active Self, Pharmacy Records  Cyanocobalamin  (VITAMIN B-12 PO) [id number] No Take 1 tablet by mouth daily. [provider] Taking Active Self, Pharmacy Records  doxycycline  (VIBRA -TABS) 100 MG tablet [id number] No Take 1 tablet (100 mg total) by mouth 2 (two) times daily. Jude Harden GAILS, MD Taking Active   ezetimibe  (ZETIA ) 10 MG tablet [id number] No TAKE 1 TABLET(10 MG) BY MOUTH DAILY Swinyer, Rosaline HERO, NP Taking Active   furosemide  (LASIX ) 40 MG tablet [id number]  Take 2 tablets (80 mg total) by mouth daily. Swinyer, Rosaline HERO, NP  Active   HYDROcodone -acetaminophen  (NORCO) 7.5-325 MG tablet [id number] No Take 1 tablet by mouth every 6 (six) hours as needed for moderate pain (pain score 4-6). Nafziger, Darleene, NP Taking Active   hydrocortisone  (ANUSOL -HC) 2.5 % rectal cream [id number]  Place 1 Application rectally 2 (two) times daily. Haze Lonni PARAS, MD  Active   ipratropium-albuterol  (DUONEB) 0.5-2.5 (3) MG/3ML SOLN [id number] No Take 3 mLs by nebulization every 4 (four) hours as needed. Caleen Burgess BROCKS, MD Taking Expired 04/23/23 2359   magnesium  oxide (MAG-OX) 400 MG tablet [id number] No Take 400 mg by mouth daily as needed (constipation). [provider] Taking Active Self, Pharmacy Records  MUCINEX  MAXIMUM STRENGTH 1200 MG [place] [id number]  No Take 1,200 mg by mouth every 12 (twelve) hours. [provider] Taking Active Self, Pharmacy Records  nitroGLYCERIN  (NITROSTAT ) 0.4 MG SL tablet [id number] No Place 1 tablet (0.4 mg total) under the tongue every 5 (five) minutes as needed for chest pain. Verlin Lonni BIRCH, MD Taking Active  Self, Pharmacy Records  nystatin  (MYCOSTATIN ) 100000 UNIT/ML suspension [id number] No Take 5 mLs (500,000 Units total) by mouth 4 (four) times daily. Merna Huxley, NP Taking Active   OXYGEN [id number] No Inhale 2-3 L/min into the lungs continuous. [provider] Taking Active Self, Pharmacy Records  PARoxetine  (PAXIL ) 40 MG tablet [id number]  Take 0.5 tablets (20 mg total) by mouth in the morning. TAKE 1 TABLET(40 MG) BY MOUTH EVERY MORNING Nafziger, Huxley, NP  Active   potassium chloride  SA (KLOR-CON  M) 20 MEQ tablet [id number]  Take 1 tablet (20 mEq total) by mouth as needed. When you take lasix . Swinyer, Rosaline HERO, NP  Active   predniSONE  (DELTASONE ) 10 MG tablet [id number] No Take 1 tablet (10 mg total) by mouth daily with breakfast. Kassie Acquanetta Bradley, MD Taking Active   rosuvastatin  (CRESTOR ) 20 MG tablet [id number] No Take 1 tablet (20 mg total) by mouth daily. Verlin Lonni BIRCH, MD Taking Active Self, Pharmacy Records  spironolactone  (ALDACTONE ) 25 MG tablet [id number] No TAKE 1/2 TABLET(12.5 MG) BY MOUTH DAILY Verlin Lonni BIRCH, MD Taking Active   valACYclovir  (VALTREX ) 1000 MG tablet [id number]  Take 1 tablet (1,000 mg total) by mouth 3 (three) times daily. Haze Lonni PARAS, MD  Active   Med List Note Regino Hunter BIRCH, CPhT 09/17/21 [id number]): Also uses mail order            Home Care and Equipment/Supplies: Were Home Health Services Ordered?: NA Any new equipment or medical supplies ordered?: NA  Functional Questionnaire: Do you need assistance with bathing/showering or dressing?: No Do you need assistance with meal preparation?: No Do you need assistance with eating?: No Do you have difficulty maintaining continence: No (currently has a foley cath in place) Do you need assistance with getting out of bed/getting out of a chair/moving?: No Do you have difficulty managing or taking your medications?: No  Follow up appointments reviewed: PCP Follow-up appointment  confirmed?: No (patient on her way back to ED) MD Provider Line Number:[phone] Given: Yes Specialist Hospital Follow-up appointment confirmed?: NA Do you need transportation to your follow-up appointment?: No Do you understand care options if your condition(s) worsen?: Yes-patient verbalized understanding    Felicia Both, CMA  [facility] AWV Team Direct Dial: [phone]

## 2023-05-12 NOTE — ED Triage Notes (Signed)
 Patient presents due to shortness of breath she has had for 2 days. She was seen at [facility] yesterday and the workup was unremarkable. She also complains of constipation and urinary retention for 4-5 days. Currently has a foley. EMS noted diminished breath sounds. Patient also has shingles.    HX COPD  EMS vitals: 90's % SPO2 on 2-3 L O2  130/68 BP 90HR PACs 20 RR 130 CBG

## 2023-05-12 NOTE — Telephone Encounter (Signed)
 Pt is not good  HR has been going up and down.  Wanted to call this week for appointment in cardiology but as of Friday she felt so bad.    Went to hospital Sun night/Mon am and is home w an indwelling catheter and she can't breath.  Last BM was 5 days ago.  I can't breath, so much trouble breathing, nauseated.  Dx: w shingles on her buttocks and hemorroids.  Passing minimal gas daily.  Adv her to go back to the ER if she feels any worse or if she vomits.    Adv to call her primary care provider as soon as we hang up so that they can help her coordinate care as none of these issues seem related to her heart.  Oximeter is 96-97 % 3L O2 HR  42-120

## 2023-05-12 NOTE — Telephone Encounter (Signed)
Looks like pt was seen for this yesterday at the ED but still having SOB.

## 2023-05-12 NOTE — H&P (Signed)
 History and Physical    Kim [name] FMW:[id number] DOB: 1955/11/04 DOA: 05/12/2023  PCP: Merna Huxley, NP  Patient coming from: Home  I have personally briefly reviewed patient's old medical records in [facility]  Chief Complaint: Shortness of breath  HPI: Kim [name] is a 68 y.o. female with medical history significant for chronic HFrEF (EF 35-40%), nonobstructive CAD, COPD on chronic prednisone , chronic respiratory failure with hypoxia on 2-3 L O2 via [place], CKD stage IIIa, HLD, chronic pain, depression/anxiety who presented to the ED for evaluation of shortness of breath.  Patient initially presented to the ED via EMS from home yesterday 05/11/23 for shortness of breath and complaint of urinary retention and suprapubic discomfort.  Bladder scan confirmed urinary retention.  Foley catheter was placed with 650 mL of urine out.  She also reported new rash on her right buttocks that was consistent with shingles rash and she was given a prescription for Valtrex .  Patient was discharged with Foley catheter in place and follow-up with urology.  Patient returned to the ED today with complaint of continued shortness of breath.  She reports new cough over the last 3 days with new green sputum production.  Patient states that she usually wears 2.5 L O2 via [place] while at rest and increases it to 3 L with activity.  Shortness of breath was not improved with her home inhalers therefore she came back to the ED for further evaluation.  Patient has been on chronic prednisone  10 mg daily, she says that this dose was reduced to 5 mg daily just yesterday per her pulmonologist.  She denies chest pain, nausea, vomiting, abdominal pain.  ED Course  Labs/Imaging on admission: I have personally reviewed following labs and imaging studies.  Initial vitals showed BP 110/53, pulse 91, RR 19, temp 98.4 F, SpO2 93%.  Labs showed sodium 143, potassium 4.7, bicarb 28, BUN 22, creatinine 1.28, serum glucose  133, LFTs within normal limits, WBC 8.7, hemoglobin 11.8, platelets 261,000.  Portable chest x-ray negative for focal consolidation, edema, effusion.  Patient was given IV Solu-Medrol  125 mg, IV magnesium  2 g, DuoNeb and albuterol  treatments.  The hospitalist service was consulted to admit for further evaluation and management.  Review of Systems: All systems reviewed and are negative except as documented in history of present illness above.   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/2008   OSTEOARTHRITIS 06/05/2009   PARESTHESIA 08/24/2007   TOBACCO ABUSE 01/25/2010    Past Surgical History:  Procedure Laterality Date   ABDOMINAL HYSTERECTOMY     APPENDECTOMY  1969   CARDIAC CATHETERIZATION N/A 07/05/2015   Procedure: Left Heart Cath and Coronary Angiography;  Surgeon: Alm LELON Clay, MD;  Location: [facility] INVASIVE CV LAB;  Service: Cardiovascular;  Laterality: N/A;   CHOLECYSTECTOMY  1989   collapse lung  1990   COLONOSCOPY WITH PROPOFOL  N/A 05/14/2018   Procedure: COLONOSCOPY WITH PROPOFOL ;  Surgeon: Abran Norleen SAILOR, MD;  Location: [facility] ENDOSCOPY;  Service: Endoscopy;  Laterality: N/A;   EXCISION MASS ABDOMINAL  2019   EXPLORATORY LAPAROTOMY  1969   LEFT HEART CATH AND CORONARY ANGIOGRAPHY N/A 11/19/2020   Procedure: LEFT HEART CATH AND CORONARY ANGIOGRAPHY;  Surgeon: Mady Bruckner, MD;  Location: [facility] INVASIVE CV LAB;  Service: Cardiovascular;  Laterality:  N/A;   POLYPECTOMY  05/14/2018   Procedure: POLYPECTOMY;  Surgeon: Abran Norleen SAILOR, MD;  Location: [facility] ENDOSCOPY;  Service: Endoscopy;;   SALPINGOOPHORECTOMY  2019    Social History:  reports that she quit smoking about 9 years ago. Her smoking use included cigarettes. She started smoking about 47 years ago. She has a 76 pack-year  smoking history. She has never used smokeless tobacco. She reports that she does not drink alcohol  and does not use drugs.  Allergies  Allergen Reactions   Tape Other (See Comments)    Skin tears easily, peels skin off   Daliresp  [Roflumilast ] Other (See Comments)    Skin peeling   Cozaar  [Losartan ] Other (See Comments)    Possible cause of severe hypotension   Dupixent  [Dupilumab ] Diarrhea, Swelling, Other (See Comments) and Cough    Facial swelling Headache   Latex Other (See Comments)    Peels skin off   Penicillins Hives   Statins Other (See Comments)    Myalgia    Torsemide  Other (See Comments)    Severe stomach cramps and all over   Zestril  [Lisinopril ] Cough   Omnicef  [Cefdinir ] Rash    Family History  Problem Relation Age of Onset   Cancer Mother        lung   Heart attack Mother    Hypertension Sister    Multiple sclerosis Brother    Diabetes Sister    Rheum arthritis Sister    Thyroid  disease Sister    Multiple sclerosis Other    Alcohol  abuse Other    Arthritis Other    Diabetes Other    Kidney disease Other    Cancer Other        lung,ovarian,skin, uterine   Stroke Other    Heart disease Other    Melanoma Other    Osteoporosis Other    Heart attack Maternal Uncle    Heart attack Other        NEICE   Heart attack Other        NEPHEW   Hypertension Brother    Stroke Maternal Grandmother    Colon cancer Neg Hx      Prior to Admission medications   Medication Sig Start Date End Date Taking? Authorizing Provider  albuterol  (VENTOLIN  HFA) 108 (90 Base) MCG/ACT inhaler INHALE 2 PUFFS INTO THE LUNGS EVERY 4 TO 6 HOURS AS NEEDED FOR SHORTNESS OF BREATH OR WHEEZING 02/02/23   Jude Harden GAILS, MD  aspirin  EC 81 MG EC tablet Take 1 tablet (81 mg total) by mouth daily. 07/08/15   Samtani, Jai-Gurmukh, MD  budesonide -formoterol  (SYMBICORT ) 160-4.5 MCG/ACT inhaler Inhale 2 puffs into the lungs 2 (two) times daily. 10/15/22 04/23/23  Caleen Burgess BROCKS, MD   butalbital -acetaminophen -caffeine  (FIORICET ) 50-325-40 MG tablet Take 1 tablet by mouth every 4 (four) hours as needed for headache. TAKE 1 TABLET BY MOUTH EVERY 4 HOURS AS NEEDED FOR HEADACHETAKE 1 TABLET BY MOUTH EVERY 4 HOURS AS NEEDED FOR HEADACHE 09/24/22   Nafziger, Darleene, NP  cetirizine (ZYRTEC) 10 MG tablet Take 10 mg by mouth daily.    [provider]  Cholecalciferol  (VITAMIN D3 PO) Take 5,000 Units by mouth daily.    [provider]  Cyanocobalamin  (VITAMIN B-12 PO) Take 1 tablet by mouth daily.    [provider]  doxycycline  (VIBRA -TABS) 100 MG tablet Take 1 tablet (100 mg total) by mouth 2 (two) times daily. 11/06/22   Jude Harden GAILS, MD  ezetimibe  (ZETIA ) 10 MG tablet TAKE  1 TABLET(10 MG) BY MOUTH DAILY 01/06/23   Swinyer, Rosaline HERO, NP  furosemide  (LASIX ) 40 MG tablet Take 2 tablets (80 mg total) by mouth daily. 04/23/23   Swinyer, Rosaline HERO, NP  HYDROcodone -acetaminophen  (NORCO) 7.5-325 MG tablet Take 1 tablet by mouth every 6 (six) hours as needed for moderate pain (pain score 4-6). 02/20/23   Nafziger, Darleene, NP  hydrocortisone  (ANUSOL -HC) 2.5 % rectal cream Place 1 Application rectally 2 (two) times daily. 05/11/23   Haze Lonni PARAS, MD  ipratropium-albuterol  (DUONEB) 0.5-2.5 (3) MG/3ML SOLN Take 3 mLs by nebulization every 4 (four) hours as needed. 10/15/22 04/23/23  Amin, Ankit C, MD  magnesium  oxide (MAG-OX) 400 MG tablet Take 400 mg by mouth daily as needed (constipation).    [provider]  MUCINEX  MAXIMUM STRENGTH 1200 MG TB12 Take 1,200 mg by mouth every 12 (twelve) hours.    [provider]  nitroGLYCERIN  (NITROSTAT ) 0.4 MG SL tablet Place 1 tablet (0.4 mg total) under the tongue every 5 (five) minutes as needed for chest pain. 10/24/20   Verlin Lonni BIRCH, MD  nystatin  (MYCOSTATIN ) 100000 UNIT/ML suspension Take 5 mLs (500,000 Units total) by mouth 4 (four) times daily. 11/04/22   Nafziger, Darleene, NP  OXYGEN Inhale 2-3 L/min  into the lungs continuous.    [provider]  PARoxetine  (PAXIL ) 40 MG tablet Take 0.5 tablets (20 mg total) by mouth in the morning. TAKE 1 TABLET(40 MG) BY MOUTH EVERY MORNING 04/30/23   Nafziger, Darleene, NP  potassium chloride  SA (KLOR-CON  M) 20 MEQ tablet Take 1 tablet (20 mEq total) by mouth as needed. When you take lasix . 04/27/23   Swinyer, Rosaline HERO, NP  predniSONE  (DELTASONE ) 10 MG tablet Take 1 tablet (10 mg total) by mouth daily with breakfast. 03/20/23   Kassie Acquanetta Bradley, MD  rosuvastatin  (CRESTOR ) 20 MG tablet Take 1 tablet (20 mg total) by mouth daily. 07/14/22   Verlin Lonni BIRCH, MD  spironolactone  (ALDACTONE ) 25 MG tablet TAKE 1/2 TABLET(12.5 MG) BY MOUTH DAILY 12/30/22   Verlin Lonni BIRCH, MD  valACYclovir  (VALTREX ) 1000 MG tablet Take 1 tablet (1,000 mg total) by mouth 3 (three) times daily. 05/11/23   Haze Lonni PARAS, MD    Physical Exam: Vitals:   05/12/23 2030 05/12/23 2045 05/12/23 2054 05/12/23 2100  BP: 131/74 115/70  117/71  Pulse: 85 84  89  Resp: 17 19  11   Temp:   97.8 F (36.6 C)   TempSrc:   Oral   SpO2: 95% 93%  96%   Constitutional: Resting in bed, NAD, calm, comfortable Eyes: EOMI, lids and conjunctivae normal ENMT: Mucous membranes are moist. Posterior pharynx clear of any exudate or lesions.Normal dentition.  Neck: normal, supple, no masses. Respiratory: Distant breath sounds.  Normal respiratory effort while on 3 L O2 via [place]. No accessory muscle use.  Cardiovascular: Regular rate and rhythm, no murmurs / rubs / gallops. No extremity edema. 2+ pedal pulses. Abdomen: no tenderness, no masses palpated. GU: Foley catheter in place with clear yellow urine in leg bag Musculoskeletal: no clubbing / cyanosis. No joint deformity upper and lower extremities. Good ROM, no contractures. Normal muscle tone.  Skin: Erythematous rash with some vesicles on right buttocks consistent with shingles appearance Neurologic:  Sensation intact.  Strength 5/5 in all 4.  Psychiatric: Normal judgment and insight. Alert and oriented x 3. Normal mood.    EKG: Personally reviewed. Sinus rhythm, rate 89, ventricular trigeminy.  Assessment/Plan Principal Problem:   COPD with  acute exacerbation (HCC) Active Problems:   Chronic respiratory failure with hypoxia (HCC)   Urinary retention   Shingles rash   Chronic HFrEF (heart failure with reduced ejection fraction) (HCC)   Chronic kidney disease, stage 3a (HCC)   Dyslipidemia   Anxiety and depression   Chronic pain syndrome   Kim [name] is a 68 y.o. female with medical history significant for chronic HFrEF (EF 35-40%), nonobstructive CAD, COPD on chronic prednisone , chronic respiratory failure with hypoxia on 2-3 L O2 via [place], CKD stage IIIa, HLD, chronic pain, depression/anxiety who is admitted with COPD exacerbation.  Assessment and Plan: COPD with acute exacerbation Chronic respiratory failure with hypoxia: Improving with initial treatment given in the ED.  Patient has been on chronic prednisone  10 mg daily which she has just decreased to 5 mg daily per her pulmonologist Dr. Alva on the day prior to admission.  She requires 2-3 L supplemental O2 via [place] at baseline.  CXR without evidence of pneumonia. -Continue Brovana /Pulmicort , DuoNebs as needed -Start azithromycin  -Continue supplemental O2 2-3 L via [place] at baseline -Continue Ohtuvayre   Urinary retention: New issue, Foley placed in ED on 2/3.  UA was negative for UTI.  Continue Foley care.  Shingles of right buttocks: Continue Valtrex .  Chronic HFrEF: Euvolemic on admission.  Last EF 35-40% by TTE on 09/11/2022. -Continue oral Lasix  80 mg nightly -Continue spironolactone  12.5 mg daily  Coronary artery disease: Stable, denies chest pain.  Continue aspirin , rosuvastatin , Zetia .  CKD stage IIIa: Renal function stable.  Hyperlipidemia: Continue rosuvastatin  and Zetia .  Depression/anxiety: Continue paroxetine  and Xanax   1 mg 3 times daily as needed.  Chronic pain syndrome: Continue home Norco as needed.   DVT prophylaxis: enoxaparin  (LOVENOX ) injection 40 mg Start: 05/13/23 1000 Code Status:   Code Status: Limited: Do not attempt resuscitation (DNR) -DNR-LIMITED -Do Not Intubate/DNI confirmed with patient on admission. Family Communication: Discussed with patient, she has discussed with family Disposition Plan: From home and likely discharge to home pending clinical progress Consults called: None Severity of Illness: The appropriate patient status for this patient is OBSERVATION. Observation status is judged to be reasonable and necessary in order to provide the required intensity of service to ensure the patient's safety. The patient's presenting symptoms, physical exam findings, and initial radiographic and laboratory data in the context of their medical condition is felt to place them at decreased risk for further clinical deterioration. Furthermore, it is anticipated that the patient will be medically stable for discharge from the hospital within 2 midnights of admission.   Jorie Blanch MD [facility]  If 7PM-7AM, please contact night-coverage [email or web address]  05/12/2023, 10:19 PM

## 2023-05-12 NOTE — Telephone Encounter (Addendum)
 Chief Complaint: shortness of breath Symptoms: SOB, hemorrhoids, pain from shingles Frequency: pt seen in the ED yesterday for SOB - hx of CHF and COPD w/ home O2 requirement of 2L Pertinent Negatives: Patient denies CP Disposition: [x] ED /[] Urgent Care (no appt availability in office) / [] Appointment(In office/virtual)/ []  [facility]/ [] Home Care/ [x] Refused Recommended Disposition /[] [facility]/ []  Follow-up with PCP  Additional Notes: Pt's husband Kim [name] called on behalf of pt. Husband states pt was seen in the ED yesterday for SOB plus other concerns like hemorrhoids, a rash, and urinary retention. Due to pt's urinary retention of pain associated with hemorrhoids/rash, pt was not taking her daily 80mg  Lasix . Foley placed in the ED, which pt still has in place. Husband says he has emptied to the foley bag 3 times today. Pt states she has not taken her Lasix  yet today and normally takes it at night. In regards to SOB, pt is reporting moderate SOB with SOB at rest that is considerably worse when she gets up to the bathroom. Pulse ox reads 95-97. Pt is wearing her home O2 [place] which husband has set at 2.5L. Husband says her stomach is moving pretty good when RN asked if pt is working hard to breathe. Pt states she is working hard to breathe. Pt was diagnosed with shingles in the ED, has been taking prescribed Valtrex . Has also been using prescribed hydrocortisone  cream for hemorrhoids. Pt states neither has been very helpful yet. Denies CP. Denies leg swelling. Denies wheezing. Per protocol, RN advised pt and husband she needs to return to the ED. Pt refused, stating she had to wait 2 hrs yesterday and was never given any pain medication. Husband gave pt the phone, RN spoke with pt as well. Pt audibly short of breath without wheezing present. RN urged pt to go to the ED and stated RN will call 911 so they can begin treating the pt en route. Pt said I will think about it, and if I  decide to go, Kim [name] will call. RN advised pt the ED is the safest option for her. Pt continued to say she will consider it. RN advised pt that husband Kim [name] needs to call 911 if/when she makes the choice to go. Pt verbalized understanding.    Copied from CRM [id number]. Topic: Clinical - Red Word Triage >> May 12, 2023  2:12 PM Kim [name] wrote: Red Word that prompted transfer to Nurse Triage: Pt husband Kim [name] called and stated that his wife was discharged from the hospital yesterday due to her heart rate went up and having trouble breathing. She has Hemorid and shingles and was given antibiotic and a cream. The pt also has a catheter and has to keep it in for at least a week. Pt has not had any bowl movement and is stopped and up and would like to know what she can do while experiencing pain in her bottom/tail bone area. Reason for Disposition  [1] MODERATE difficulty breathing (e.g., speaks in phrases, SOB even at rest, pulse 100-120) AND [2] NEW-onset or WORSE than normal  Answer Assessment - Initial Assessment Questions 1. RESPIRATORY STATUS: Describe your breathing? (e.g., wheezing, shortness of breath, unable to speak, severe coughing)      Shortness of breath (missed Lasix  doses due to urinary retention), uses 2L at baseline. Husband set to 2.5L. Has to work extra hard to breathe, stomach moving pretty good. 2. ONSET: When did this breathing problem begin?      [employer]  to ER last night 3. PATTERN Does the difficult breathing come and go, or has it been constant since it started?      Worse when she gets up to use the bathroom 4. SEVERITY: How bad is your breathing? (e.g., mild, moderate, severe)    - MILD: No SOB at rest, mild SOB with walking, speaks normally in sentences, can lie down, no retractions, pulse < 100.    - MODERATE: SOB at rest, SOB with minimal exertion and prefers to sit, cannot lie down flat, speaks in phrases, mild retractions, audible wheezing, pulse 100-120.    -  SEVERE: Very SOB at rest, speaks in single words, struggling to breathe, sitting hunched forward, retractions, pulse > 120      Moderate - SOB at rest 5. RECURRENT SYMPTOM: Have you had difficulty breathing before? If Yes, ask: When was the last time? and What happened that time?      Hx of COPD/asthma 6. CARDIAC HISTORY: Do you have any history of heart disease? (e.g., heart attack, angina, bypass surgery, angioplasty)      CHF 7. LUNG HISTORY: Do you have any history of lung disease?  (e.g., pulmonary embolus, asthma, emphysema)     COPD/asthma 8. CAUSE: What do you think is causing the breathing problem?      HF 9. OTHER SYMPTOMS: Do you have any other symptoms? (e.g., dizziness, runny nose, cough, chest pain, fever)     Shingles and hemorrhoids. Tail bone pain and pain to the right side of butt cheek (10/10 pain) from shingles. Dumped foley bag 3-4x today. Did not take Lasix  today (80mg  per day). No CP. No swelling in legs. Same SOB as yesterday. Hx of CHF and COPD. LBM yesterday in the ED but it was very small, husband states she has been constipated. 10. O2 SATURATION MONITOR:  Do you use an oxygen saturation monitor (pulse oximeter) at home? If Yes, ask: What is your reading (oxygen level) today? What is your usual oxygen saturation reading? (e.g., 95%)       O2 level shows 95-97.  Protocols used: Breathing Difficulty-A-AH

## 2023-05-12 NOTE — Telephone Encounter (Signed)
 Patient c/o Palpitations:  STAT if patient reporting lightheadedness, shortness of breath, or chest pain  How long have you had palpitations/irregular HR/ Afib? Are you having the symptoms now? No  Are you currently experiencing lightheadedness, SOB or CP? SOB  Do you have a history of afib (atrial fibrillation) or irregular heart rhythm? tachycardia  Have you checked your BP or HR? (document readings if available): 99, running 90-128  Are you experiencing any other symptoms? No, was seen at ED yesterday but requesting cb to further discuss

## 2023-05-12 NOTE — Hospital Course (Addendum)
 Brief Narrative:  68 year old with history of CHF EF 35%, nonobstructive CAD, COPD on chronic prednisone , chronic hypoxia 3 L nasal cannula CKD stage IIIa, hyperlipidemia, chronic pain, depression/anxiety admitted for shortness of breath.  Patient was found to be in COPD exacerbation started on steroids and bronchodilators.  Foley catheter also placed in the ED due to new urinary retention.   Assessment & Plan:  Principal Problem:   COPD with acute exacerbation (HCC) Active Problems:   Chronic respiratory failure with hypoxia (HCC)   Urinary retention   Shingles rash   Chronic HFrEF (heart failure with reduced ejection fraction) (HCC)   Chronic kidney disease, stage 3a (HCC)   Dyslipidemia   Anxiety and depression   Chronic pain syndrome   COPD with acute exacerbation Chronic respiratory failure with hypoxia: At home uses 2-3 L nasal cannula.  Continue supplemental oxygen here and wean as appropriate.  Follows outpatient pulmonary, Dr. Jude.  For now continue Solu-Medrol , bronchodilators scheduled and as needed.  I-S/flutter valve.  BNP overall unremarkable.  Check procalcitonin.  Finish 3 days of azithromycin    Urinary retention: New issue, EDP placed Foley on 2/3.  UA is negative.  Discontinue Foley catheter, perform voiding trial later today. She has outpatient appointment with urology on upcoming Tuesday.   Shingles of right buttocks: Continue Valtrex .   Chronic HFrEF: Euvolemic on admission.  Last EF 35-40% by TTE on 09/11/2022. Continue Lasix  and Aldactone .   Coronary artery disease: Chest pain-free Stable, denies chest pain.  Continue aspirin , rosuvastatin , Zetia .   CKD stage IIIa: Cr 1.3 (baseline) Renal function stable.   Hyperlipidemia: Continue rosuvastatin  and Zetia .   Depression/anxiety: Continue paroxetine  and Xanax  1 mg 3 times daily as needed.   Chronic pain syndrome: Continue home Norco as needed.   DVT prophylaxis: Lovenox     Code Status: Limited: Do  not attempt resuscitation (DNR) -DNR-LIMITED -Do Not Intubate/DNI  Family Communication: Husband at bedside Continue hospital stay for significant abnormal breath sounds    Subjective: Seen at bedside, reports of constipation.  Tells me her breathing is slightly better compared to yesterday but still quite dyspneic with minimal exertion. Refusing Lovenox  shots.  She understands the risk.  Husband is present at bedside   Examination:  General exam: Appears calm and comfortable  Respiratory system: Bilateral diffuse diminished breath sounds Cardiovascular system: S1 & S2 heard, RRR. No JVD, murmurs, rubs, gallops or clicks. No pedal edema. Gastrointestinal system: Abdomen is nondistended, soft and nontender. No organomegaly or masses felt. Normal bowel sounds heard. Central nervous system: Alert and oriented. No focal neurological deficits. Extremities: Symmetric 5 x 5 power. Skin: No rashes, lesions or ulcers Psychiatry: Judgement and insight appear normal. Mood & affect appropriate.

## 2023-05-12 NOTE — ED Provider Notes (Signed)
  EMERGENCY DEPARTMENT AT [facility] Provider Note   CSN: [id number] Arrival date & time: 05/12/23  1612     History  Chief Complaint  Patient presents with   Shortness of Breath   Constipation   Urinary Retention    Kim [name] is a 68 y.o. female.  Patient has a history of COPD.  Patient complains of cough and shortness of breath.  She is normally on a couple liters at home.  She was seen 2 days ago in the emergency department and discharged home  The history is provided by the patient and medical records. No language interpreter was used.  Shortness of Breath Severity:  Moderate Onset quality:  Gradual Timing:  Constant Progression:  Worsening Chronicity:  Recurrent Context: activity   Relieved by:  Nothing Worsened by:  Nothing Ineffective treatments:  None tried Associated symptoms: no abdominal pain, no chest pain, no cough, no headaches and no rash   Constipation Associated symptoms: no abdominal pain, no back pain and no diarrhea        Home Medications Prior to Admission medications   Medication Sig Start Date End Date Taking? Authorizing Provider  albuterol  (VENTOLIN  HFA) 108 (90 Base) MCG/ACT inhaler INHALE 2 PUFFS INTO THE LUNGS EVERY 4 TO 6 HOURS AS NEEDED FOR SHORTNESS OF BREATH OR WHEEZING 02/02/23   Jude Harden GAILS, MD  aspirin  EC 81 MG EC tablet Take 1 tablet (81 mg total) by mouth daily. 07/08/15   Samtani, Jai-Gurmukh, MD  budesonide -formoterol  (SYMBICORT ) 160-4.5 MCG/ACT inhaler Inhale 2 puffs into the lungs 2 (two) times daily. 10/15/22 04/23/23  Caleen Burgess BROCKS, MD  butalbital -acetaminophen -caffeine  (FIORICET ) 50-325-40 MG tablet Take 1 tablet by mouth every 4 (four) hours as needed for headache. TAKE 1 TABLET BY MOUTH EVERY 4 HOURS AS NEEDED FOR HEADACHETAKE 1 TABLET BY MOUTH EVERY 4 HOURS AS NEEDED FOR HEADACHE 09/24/22   Nafziger, Darleene, NP  cetirizine (ZYRTEC) 10 MG tablet Take 10 mg by mouth daily.    [provider]   Cholecalciferol  (VITAMIN D3 PO) Take 5,000 Units by mouth daily.    [provider]  Cyanocobalamin  (VITAMIN B-12 PO) Take 1 tablet by mouth daily.    [provider]  doxycycline  (VIBRA -TABS) 100 MG tablet Take 1 tablet (100 mg total) by mouth 2 (two) times daily. 11/06/22   Jude Harden GAILS, MD  ezetimibe  (ZETIA ) 10 MG tablet TAKE 1 TABLET(10 MG) BY MOUTH DAILY 01/06/23   Swinyer, Rosaline HERO, NP  furosemide  (LASIX ) 40 MG tablet Take 2 tablets (80 mg total) by mouth daily. 04/23/23   Swinyer, Rosaline HERO, NP  HYDROcodone -acetaminophen  (NORCO) 7.5-325 MG tablet Take 1 tablet by mouth every 6 (six) hours as needed for moderate pain (pain score 4-6). 02/20/23   Nafziger, Darleene, NP  hydrocortisone  (ANUSOL -HC) 2.5 % rectal cream Place 1 Application rectally 2 (two) times daily. 05/11/23   Pollina, Christopher J, MD  ipratropium-albuterol  (DUONEB) 0.5-2.5 (3) MG/3ML SOLN Take 3 mLs by nebulization every 4 (four) hours as needed. 10/15/22 04/23/23  Amin, Ankit C, MD  magnesium  oxide (MAG-OX) 400 MG tablet Take 400 mg by mouth daily as needed (constipation).    [provider]  MUCINEX  MAXIMUM STRENGTH 1200 MG TB12 Take 1,200 mg by mouth every 12 (twelve) hours.    [provider]  nitroGLYCERIN  (NITROSTAT ) 0.4 MG SL tablet Place 1 tablet (0.4 mg total) under the tongue every 5 (five) minutes as needed for chest pain. 10/24/20   Verlin Bruckner  D, MD  nystatin  (MYCOSTATIN ) 100000 UNIT/ML suspension Take 5 mLs (500,000 Units total) by mouth 4 (four) times daily. 11/04/22   Nafziger, Darleene, NP  OXYGEN Inhale 2-3 L/min into the lungs continuous.    [provider]  PARoxetine  (PAXIL ) 40 MG tablet Take 0.5 tablets (20 mg total) by mouth in the morning. TAKE 1 TABLET(40 MG) BY MOUTH EVERY MORNING 04/30/23   Nafziger, Darleene, NP  potassium chloride  SA (KLOR-CON  M) 20 MEQ tablet Take 1 tablet (20 mEq total) by mouth as needed. When you take lasix . 04/27/23   Swinyer, Rosaline HERO, NP   predniSONE  (DELTASONE ) 10 MG tablet Take 1 tablet (10 mg total) by mouth daily with breakfast. 03/20/23   Kassie Acquanetta Bradley, MD  rosuvastatin  (CRESTOR ) 20 MG tablet Take 1 tablet (20 mg total) by mouth daily. 07/14/22   Verlin Lonni BIRCH, MD  spironolactone  (ALDACTONE ) 25 MG tablet TAKE 1/2 TABLET(12.5 MG) BY MOUTH DAILY 12/30/22   Verlin Lonni BIRCH, MD  valACYclovir  (VALTREX ) 1000 MG tablet Take 1 tablet (1,000 mg total) by mouth 3 (three) times daily. 05/11/23   Pollina, Lonni PARAS, MD      Allergies    Tape, Daliresp  [roflumilast ], Cozaar  [losartan ], Dupixent  [dupilumab ], Latex, Morphine  and codeine, Penicillins, Statins, Zestril  [lisinopril ], and Omnicef  [cefdinir ]    Review of Systems   Review of Systems  Constitutional:  Negative for appetite change and fatigue.  HENT:  Negative for congestion, ear discharge and sinus pressure.   Eyes:  Negative for discharge.  Respiratory:  Positive for shortness of breath. Negative for cough.   Cardiovascular:  Negative for chest pain.  Gastrointestinal:  Positive for constipation. Negative for abdominal pain and diarrhea.  Genitourinary:  Negative for frequency and hematuria.  Musculoskeletal:  Negative for back pain.  Skin:  Negative for rash.  Neurological:  Negative for seizures and headaches.  Psychiatric/Behavioral:  Negative for hallucinations.     Physical Exam Updated Vital Signs BP 120/73   Pulse 81   Temp 98.4 F (36.9 C)   Resp 16   SpO2 97%  Physical Exam Vitals and nursing note reviewed.  Constitutional:      Appearance: She is well-developed.  HENT:     Head: Normocephalic.     Nose: Nose normal.  Eyes:     General: No scleral icterus.    Conjunctiva/sclera: Conjunctivae normal.  Neck:     Thyroid : No thyromegaly.  Cardiovascular:     Rate and Rhythm: Normal rate and regular rhythm.     Heart sounds: No murmur heard.    No friction rub. No gallop.  Pulmonary:     Breath sounds: No stridor. Wheezing  present. No rales.  Chest:     Chest wall: No tenderness.  Abdominal:     General: There is no distension.     Tenderness: There is no abdominal tenderness. There is no rebound.  Musculoskeletal:        General: Normal range of motion.     Cervical back: Neck supple.  Lymphadenopathy:     Cervical: No cervical adenopathy.  Skin:    Findings: No erythema or rash.  Neurological:     Mental Status: She is alert and oriented to person, place, and time.     Motor: No abnormal muscle tone.     Coordination: Coordination normal.  Psychiatric:        Behavior: Behavior normal.     ED Results / Procedures / Treatments   Labs (all labs ordered are  listed, but only abnormal results are displayed) Labs Reviewed  CBC WITH DIFFERENTIAL/PLATELET - Abnormal; Notable for the following components:      Result Value   Hemoglobin 11.8 (*)    RDW 16.5 (*)    All other components within normal limits  COMPREHENSIVE METABOLIC PANEL - Abnormal; Notable for the following components:   Glucose, Bld 133 (*)    Creatinine, Ser 1.28 (*)    GFR, Estimated 45 (*)    All other components within normal limits    EKG None  Radiology DG Chest Port 1 View Result Date: 05/12/2023 CLINICAL DATA:  Shortness of breath over the last 2 days. EXAM: PORTABLE CHEST 1 VIEW COMPARISON:  05/11/2023 FINDINGS: Artifact overlies the chest. Heart size is normal. Chronic aortic atherosclerotic calcification is noted. The lungs are clear. No infiltrate, collapse or effusion. Mild chronic interstitial markings noted in the lower lungs. IMPRESSION: No active disease. Aortic atherosclerosis. Mild chronic interstitial markings in the lower lungs. Electronically Signed   By: Oneil Officer M.D.   On: 05/12/2023 17:22   DG Chest Port 1 View Result Date: 05/11/2023 CLINICAL DATA:  Dyspnea EXAM: PORTABLE CHEST 1 VIEW COMPARISON:  None Available. FINDINGS: The heart size and mediastinal contours are within normal limits. Both lungs are  clear. The visualized skeletal structures are unremarkable. IMPRESSION: No active disease. Electronically Signed   By: Dorethia Molt M.D.   On: 05/11/2023 03:25    Procedures Procedures    Medications Ordered in ED Medications  ipratropium-albuterol  (DUONEB) 0.5-2.5 (3) MG/3ML nebulizer solution 3 mL (3 mLs Nebulization Given 05/12/23 1739)  albuterol  (PROVENTIL ) (2.5 MG/3ML) 0.083% nebulizer solution 2.5 mg (2.5 mg Nebulization Given 05/12/23 1739)  magnesium  sulfate IVPB 2 g 50 mL (0 g Intravenous Stopped 05/12/23 1851)  methylPREDNISolone  sodium succinate (SOLU-MEDROL ) 125 mg/2 mL injection 125 mg (125 mg Intravenous Given 05/12/23 1740)    ED Course/ Medical Decision Making/ A&P                                 Medical Decision Making Amount and/or Complexity of Data Reviewed Labs: ordered. Radiology: ordered. ECG/medicine tests: ordered.  Risk Prescription drug management. Decision regarding hospitalization.  This patient presents to the ED for concern of shortness of breath, this involves an extensive number of treatment options, and is a complaint that carries with it a high risk of complications and morbidity.  The differential diagnosis includes pneumonia, COPD   Co morbidities that complicate the patient evaluation  COPD   Additional history obtained:  Additional history obtained from patient External records from outside source obtained and reviewed including hospital records   Lab Tests:  I Ordered, and personally interpreted labs.  The pertinent results include: White count 8.4   Imaging Studies ordered:  I ordered imaging studies including chest x-ray I independently visualized and interpreted imaging which showed chronic interstitial markings I agree with the radiologist interpretation   Cardiac Monitoring: / EKG:  The patient was maintained on a cardiac monitor.  I personally viewed and interpreted the cardiac monitored which showed an underlying  rhythm of: Normal sinus rhythm   Consultations Obtained:  I requested consultation with the hospitalist,  and discussed lab and imaging findings as well as pertinent plan - they recommend: Admit   Problem List / ED Course / Critical interventions / Medication management  COPD exacerbation I ordered medication including albuterol  and Atrovent  Reevaluation of the patient after  these medicines showed that the patient improved I have reviewed the patients home medicines and have made adjustments as needed   Social Determinants of Health:  None   Test / Admission - Considered:  None  Patient with COPD exacerbation.  She will be admitted to medicine        Final Clinical Impression(s) / ED Diagnoses Final diagnoses:  COPD exacerbation [facility])    Rx / DC Orders ED Discharge Orders     None         Suzette Pac, MD 05/13/23 [phone]

## 2023-05-12 NOTE — ED Notes (Signed)
Pt requesting her night time anxiety medication

## 2023-05-13 ENCOUNTER — Inpatient Hospital Stay: Admission: RE | Admit: 2023-05-13 | Payer: Medicare Other | Source: Ambulatory Visit

## 2023-05-13 DIAGNOSIS — Z9049 Acquired absence of other specified parts of digestive tract: Secondary | ICD-10-CM | POA: Diagnosis not present

## 2023-05-13 DIAGNOSIS — B029 Zoster without complications: Secondary | ICD-10-CM | POA: Diagnosis present

## 2023-05-13 DIAGNOSIS — Z7951 Long term (current) use of inhaled steroids: Secondary | ICD-10-CM | POA: Diagnosis not present

## 2023-05-13 DIAGNOSIS — Z7952 Long term (current) use of systemic steroids: Secondary | ICD-10-CM | POA: Diagnosis not present

## 2023-05-13 DIAGNOSIS — E785 Hyperlipidemia, unspecified: Secondary | ICD-10-CM | POA: Diagnosis present

## 2023-05-13 DIAGNOSIS — G894 Chronic pain syndrome: Secondary | ICD-10-CM | POA: Diagnosis present

## 2023-05-13 DIAGNOSIS — N1831 Chronic kidney disease, stage 3a: Secondary | ICD-10-CM | POA: Diagnosis present

## 2023-05-13 DIAGNOSIS — F32A Depression, unspecified: Secondary | ICD-10-CM | POA: Diagnosis present

## 2023-05-13 DIAGNOSIS — Z9981 Dependence on supplemental oxygen: Secondary | ICD-10-CM | POA: Diagnosis not present

## 2023-05-13 DIAGNOSIS — R339 Retention of urine, unspecified: Secondary | ICD-10-CM | POA: Diagnosis present

## 2023-05-13 DIAGNOSIS — E875 Hyperkalemia: Secondary | ICD-10-CM | POA: Diagnosis not present

## 2023-05-13 DIAGNOSIS — Z8249 Family history of ischemic heart disease and other diseases of the circulatory system: Secondary | ICD-10-CM | POA: Diagnosis not present

## 2023-05-13 DIAGNOSIS — Z66 Do not resuscitate: Secondary | ICD-10-CM | POA: Diagnosis present

## 2023-05-13 DIAGNOSIS — K649 Unspecified hemorrhoids: Secondary | ICD-10-CM | POA: Diagnosis present

## 2023-05-13 DIAGNOSIS — J9611 Chronic respiratory failure with hypoxia: Secondary | ICD-10-CM | POA: Diagnosis present

## 2023-05-13 DIAGNOSIS — Z993 Dependence on wheelchair: Secondary | ICD-10-CM | POA: Diagnosis not present

## 2023-05-13 DIAGNOSIS — I2510 Atherosclerotic heart disease of native coronary artery without angina pectoris: Secondary | ICD-10-CM | POA: Diagnosis present

## 2023-05-13 DIAGNOSIS — K5900 Constipation, unspecified: Secondary | ICD-10-CM | POA: Diagnosis present

## 2023-05-13 DIAGNOSIS — Z87891 Personal history of nicotine dependence: Secondary | ICD-10-CM | POA: Diagnosis not present

## 2023-05-13 DIAGNOSIS — F419 Anxiety disorder, unspecified: Secondary | ICD-10-CM | POA: Diagnosis present

## 2023-05-13 DIAGNOSIS — I5022 Chronic systolic (congestive) heart failure: Secondary | ICD-10-CM | POA: Diagnosis present

## 2023-05-13 DIAGNOSIS — J441 Chronic obstructive pulmonary disease with (acute) exacerbation: Secondary | ICD-10-CM | POA: Diagnosis present

## 2023-05-13 DIAGNOSIS — Z1152 Encounter for screening for COVID-19: Secondary | ICD-10-CM | POA: Diagnosis not present

## 2023-05-13 DIAGNOSIS — Z87442 Personal history of urinary calculi: Secondary | ICD-10-CM | POA: Diagnosis not present

## 2023-05-13 LAB — BASIC METABOLIC PANEL
Anion gap: 11 (ref 5–15)
BUN: 32 mg/dL — ABNORMAL HIGH (ref 8–23)
CO2: 26 mmol/L (ref 22–32)
Calcium: 8.8 mg/dL — ABNORMAL LOW (ref 8.9–10.3)
Chloride: 100 mmol/L (ref 98–111)
Creatinine, Ser: 1.33 mg/dL — ABNORMAL HIGH (ref 0.44–1.00)
GFR, Estimated: 43 mL/min — ABNORMAL LOW (ref >60)
Glucose, Bld: 209 mg/dL — ABNORMAL HIGH (ref 70–99)
Potassium: 4.3 mmol/L (ref 3.5–5.1)
Sodium: 137 mmol/L (ref 135–145)

## 2023-05-13 LAB — CBC
HCT: 36 % (ref 36.0–46.0)
Hemoglobin: 11.1 g/dL — ABNORMAL LOW (ref 12.0–15.0)
MCH: 30.6 pg (ref 26.0–34.0)
MCHC: 30.8 g/dL (ref 30.0–36.0)
MCV: 99.2 fL (ref 80.0–100.0)
Platelets: 247 10*3/uL (ref 150–400)
RBC: 3.63 MIL/uL — ABNORMAL LOW (ref 3.87–5.11)
RDW: 16.1 % — ABNORMAL HIGH (ref 11.5–15.5)
WBC: 8.4 10*3/uL (ref 4.0–10.5)
nRBC: 0 % (ref 0.0–0.2)

## 2023-05-13 MED ORDER — DEXTROSE-SODIUM CHLORIDE 5-0.45 % IV SOLN: INTRAVENOUS | Status: DC

## 2023-05-13 MED ORDER — CHLORHEXIDINE GLUCONATE CLOTH 2 % EX PADS
6.0000 | MEDICATED_PAD | Freq: Every day | CUTANEOUS | Status: DC
  Administered 2023-05-13 – 2023-05-18 (×6): 6 via TOPICAL

## 2023-05-13 MED ORDER — GLUCAGON HCL RDNA (DIAGNOSTIC) 1 MG IJ SOLR: 1.0000 mg | INTRAMUSCULAR | Status: DC | PRN

## 2023-05-13 MED ORDER — STERILE WATER FOR INJECTION IJ SOLN
INTRAMUSCULAR | Status: AC
  Administered 2023-05-13: 10 mL
  Filled 2023-05-13: qty 10

## 2023-05-13 MED ORDER — HYDRALAZINE HCL 20 MG/ML IJ SOLN: 10.0000 mg | INTRAMUSCULAR | Status: DC | PRN

## 2023-05-13 MED ORDER — IPRATROPIUM-ALBUTEROL 0.5-2.5 (3) MG/3ML IN SOLN
3.0000 mL | Freq: Three times a day (TID) | RESPIRATORY_TRACT | Status: DC
  Administered 2023-05-13 – 2023-05-15 (×5): 3 mL via RESPIRATORY_TRACT
  Filled 2023-05-13 (×6): qty 3

## 2023-05-13 MED ORDER — METOPROLOL TARTRATE 5 MG/5ML IV SOLN: 5.0000 mg | INTRAVENOUS | Status: DC | PRN

## 2023-05-13 MED ORDER — TRAZODONE HCL 50 MG PO TABS
50.0000 mg | ORAL_TABLET | Freq: Every evening | ORAL | Status: DC | PRN
  Administered 2023-05-13 – 2023-05-16 (×4): 50 mg via ORAL
  Filled 2023-05-13 (×4): qty 1

## 2023-05-13 MED ORDER — IPRATROPIUM-ALBUTEROL 0.5-2.5 (3) MG/3ML IN SOLN
3.0000 mL | RESPIRATORY_TRACT | Status: DC | PRN
  Administered 2023-05-13 – 2023-05-14 (×2): 3 mL via RESPIRATORY_TRACT
  Filled 2023-05-13 (×2): qty 3

## 2023-05-13 NOTE — Progress Notes (Signed)
 Pt set up on BIPAP for night rest.  Pt is using her mask and tubing with 3 lpm bled in oxygen.

## 2023-05-13 NOTE — Progress Notes (Signed)
 PROGRESS NOTE    Kim [name]  FMW:[id number] DOB: [birth year] DOA: 05/12/2023 PCP: Merna Huxley, NP    Brief Narrative:  68 year old with history of CHF EF 35%, nonobstructive CAD, COPD on chronic prednisone , chronic hypoxia 3 L nasal cannula CKD stage IIIa, hyperlipidemia, chronic pain, depression/anxiety admitted for shortness of breath.  Patient was found to be in COPD exacerbation started on steroids and bronchodilators.  Foley catheter also placed in the ED due to new urinary retention.   Assessment & Plan:  Principal Problem:   COPD with acute exacerbation (HCC) Active Problems:   Chronic respiratory failure with hypoxia (HCC)   Urinary retention   Shingles rash   Chronic HFrEF (heart failure with reduced ejection fraction) (HCC)   Chronic kidney disease, stage 3a (HCC)   Dyslipidemia   Anxiety and depression   Chronic pain syndrome   COPD with acute exacerbation Chronic respiratory failure with hypoxia: At home uses 2-3 L nasal cannula.  Continue supplemental oxygen here and wean as appropriate.  Follows outpatient pulmonary, Dr. Jude.  For now continue Solu-Medrol , bronchodilators scheduled and as needed.  Will check procalcitonin and BNP.  I-S/flutter valve.   Urinary retention: New issue, EDP placed Foley on 2/3.  UA is negative.  We will let him to remove it and perform post voiding trial.   Shingles of right buttocks: Continue Valtrex .   Chronic HFrEF: Euvolemic on admission.  Last EF 35-40% by TTE on 09/11/2022. Continue Lasix  and Aldactone .   Coronary artery disease: Chest pain-free Stable, denies chest pain.  Continue aspirin , rosuvastatin , Zetia .   CKD stage IIIa: Cr 1.3 (baseline) Renal function stable.   Hyperlipidemia: Continue rosuvastatin  and Zetia .   Depression/anxiety: Continue paroxetine  and Xanax  1 mg 3 times daily as needed.   Chronic pain syndrome: Continue home Norco as needed.   DVT prophylaxis: enoxaparin  (LOVENOX ) injection 40 mg  Start: 05/13/23 1000    Code Status: Limited: Do not attempt resuscitation (DNR) -DNR-LIMITED -Do Not Intubate/DNI  Family Communication:   Continue hospital stay for significant abnormal breath sounds    Subjective: Still having significant abnormal breath sounds at rest. No other complaints.  Speech feels slightly better compared to yesterday   Examination:  General exam: Appears calm and comfortable  Respiratory system: Bilateral diffuse diminished breath sounds Cardiovascular system: S1 & S2 heard, RRR. No JVD, murmurs, rubs, gallops or clicks. No pedal edema. Gastrointestinal system: Abdomen is nondistended, soft and nontender. No organomegaly or masses felt. Normal bowel sounds heard. Central nervous system: Alert and oriented. No focal neurological deficits. Extremities: Symmetric 5 x 5 power. Skin: No rashes, lesions or ulcers Psychiatry: Judgement and insight appear normal. Mood & affect appropriate.                Diet Orders (From admission, onward)     Start     Ordered   05/12/23 2048  Diet Heart Room service appropriate? Yes; Fluid consistency: Thin  Diet effective now       Question Answer Comment  Room service appropriate? Yes   Fluid consistency: Thin      05/12/23 2047            Objective: Vitals:   05/13/23 0900 05/13/23 0915 05/13/23 0919 05/13/23 1000  BP: 112/64   (!) 135/90  Pulse: 89 91  96  Resp: (!) 22 (!) 21  19  Temp:   (!) 97.5 F (36.4 C)   TempSrc:   Oral   SpO2: 96% 90%  93%  Intake/Output Summary (Last 24 hours) at 05/13/2023 1106 Last data filed at 05/12/2023 1905 Gross per 24 hour  Intake 45.95 ml  Output 400 ml  Net -354.05 ml   There were no vitals filed for this visit.  Scheduled Meds:  arformoterol   15 mcg Nebulization BID   aspirin  EC  81 mg Oral Q M,W,F   azithromycin   500 mg Oral Daily   budesonide  (PULMICORT ) nebulizer solution  0.25 mg Nebulization BID   enoxaparin  (LOVENOX ) injection  40 mg  Subcutaneous Q24H   Ensifentrine   3 mg Nebulization BID   ezetimibe   10 mg Oral Daily   furosemide   80 mg Oral QHS   guaiFENesin   1,200 mg Oral Q12H   hydrocortisone   1 Application Rectal BID   methylPREDNISolone  (SOLU-MEDROL ) injection  40 mg Intravenous Q12H   PARoxetine   40 mg Oral Daily   potassium chloride   20 mEq Oral Daily   rosuvastatin   20 mg Oral Daily   sodium chloride  flush  3 mL Intravenous Q12H   spironolactone   12.5 mg Oral Daily   valACYclovir   1,000 mg Oral TID   Continuous Infusions:  Nutritional status     There is no height or weight on file to calculate BMI.  Data Reviewed:   CBC: Recent Labs  Lab 05/11/23 0345 05/12/23 1816 05/13/23 0452  WBC 11.7* 8.7 8.4  NEUTROABS 7.3 5.9  --   HGB 11.9* 11.8* 11.1*  HCT 37.6 38.8 36.0  MCV 97.7 99.7 99.2  PLT 264 261 247   Basic Metabolic Panel: Recent Labs  Lab 05/11/23 0345 05/12/23 1816 05/13/23 0452  NA 142 143 137  K 4.2 4.7 4.3  CL 106 105 100  CO2 25 28 26   GLUCOSE 122* 133* 209*  BUN 23 22 32*  CREATININE 1.55* 1.28* 1.33*  CALCIUM  9.4 9.3 8.8*   GFR: CrCl cannot be calculated (Unknown ideal weight.). Liver Function Tests: Recent Labs  Lab 05/11/23 0345 05/12/23 1816  AST 29 31  ALT 25 28  ALKPHOS 48 49  BILITOT 0.6 0.6  PROT 5.6* 6.6  ALBUMIN 3.3* 3.7   No results for input(s): LIPASE, AMYLASE in the last 168 hours. No results for input(s): AMMONIA in the last 168 hours. Coagulation Profile: No results for input(s): INR, PROTIME in the last 168 hours. Cardiac Enzymes: No results for input(s): CKTOTAL, CKMB, CKMBINDEX, TROPONINI in the last 168 hours. BNP (last 3 results) Recent Labs    04/23/23 1547  PROBNP 337*   HbA1C: No results for input(s): HGBA1C in the last 72 hours. CBG: No results for input(s): GLUCAP in the last 168 hours. Lipid Profile: No results for input(s): CHOL, HDL, LDLCALC, TRIG, CHOLHDL, LDLDIRECT in the last 72  hours. Thyroid  Function Tests: No results for input(s): TSH, T4TOTAL, FREET4, T3FREE, THYROIDAB in the last 72 hours. Anemia Panel: No results for input(s): VITAMINB12, FOLATE, FERRITIN, TIBC, IRON, RETICCTPCT in the last 72 hours. Sepsis Labs: Recent Labs  Lab 05/11/23 0423 05/11/23 0756  LATICACIDVEN 1.4 1.6    Recent Results (from the past 240 hours)  Resp panel by RT-PCR (RSV, Flu A&B, Covid) Urine, Catheterized     Status: None   Collection Time: 05/11/23  3:45 AM   Specimen: Urine, Catheterized; Nasal Swab  Result Value Ref Range Status   SARS Coronavirus 2 by RT PCR NEGATIVE NEGATIVE Final   Influenza A by PCR NEGATIVE NEGATIVE Final   Influenza B by PCR NEGATIVE NEGATIVE Final    Comment: (NOTE) The Xpert  Xpress SARS-CoV-2/FLU/RSV plus assay is intended as an aid in the diagnosis of influenza from Nasopharyngeal swab specimens and should not be used as a sole basis for treatment. Nasal washings and aspirates are unacceptable for Xpert Xpress SARS-CoV-2/FLU/RSV testing.  Fact Sheet for Patients: bloggercourse.com  Fact Sheet for Healthcare Providers: seriousbroker.it  This test is not yet approved or cleared by the [place]  FDA and has been authorized for detection and/or diagnosis of SARS-CoV-2 by FDA under an Emergency Use Authorization (EUA). This EUA will remain in effect (meaning this test can be used) for the duration of the COVID-19 declaration under Section 564(b)(1) of the Act, 21 U.S.C. section 360bbb-3(b)(1), unless the authorization is terminated or revoked.     Resp Syncytial Virus by PCR NEGATIVE NEGATIVE Final    Comment: (NOTE) Fact Sheet for Patients: bloggercourse.com  Fact Sheet for Healthcare Providers: seriousbroker.it  This test is not yet approved or cleared by the [place]  FDA and has been authorized for  detection and/or diagnosis of SARS-CoV-2 by FDA under an Emergency Use Authorization (EUA). This EUA will remain in effect (meaning this test can be used) for the duration of the COVID-19 declaration under Section 564(b)(1) of the Act, 21 U.S.C. section 360bbb-3(b)(1), unless the authorization is terminated or revoked.  Performed at [facility], [address] [place], [place] [address]   Culture, blood (single) w Reflex to ID Panel     Status: None (Preliminary result)   Collection Time: 05/11/23  4:27 AM   Specimen: BLOOD  Result Value Ref Range Status   Specimen Description BLOOD SITE NOT SPECIFIED  Final   Special Requests   Final    BOTTLES DRAWN AEROBIC AND ANAEROBIC Blood Culture adequate volume   Culture   Final    NO GROWTH 2 DAYS Performed at [facility], [address], [place], [place] [address]    Report Status PENDING  Incomplete         Radiology Studies: DG Chest Port 1 View Result Date: 05/12/2023 CLINICAL DATA:  Shortness of breath over the last 2 days. EXAM: PORTABLE CHEST 1 VIEW COMPARISON:  05/11/2023 FINDINGS: Artifact overlies the chest. Heart size is normal. Chronic aortic atherosclerotic calcification is noted. The lungs are clear. No infiltrate, collapse or effusion. Mild chronic interstitial markings noted in the lower lungs. IMPRESSION: No active disease. Aortic atherosclerosis. Mild chronic interstitial markings in the lower lungs. Electronically Signed   By: Oneil Officer M.D.   On: 05/12/2023 17:22           LOS: 0 days   Time spent= 35 mins    Greidys Deland JAYSON Dare, MD [facility]  If 7PM-7AM, please contact night-coverage  05/13/2023, 11:06 AM

## 2023-05-14 DIAGNOSIS — J441 Chronic obstructive pulmonary disease with (acute) exacerbation: Secondary | ICD-10-CM | POA: Diagnosis not present

## 2023-05-14 LAB — CBC
HCT: 35.3 % — ABNORMAL LOW (ref 36.0–46.0)
Hemoglobin: 10.4 g/dL — ABNORMAL LOW (ref 12.0–15.0)
MCH: 30.7 pg (ref 26.0–34.0)
MCHC: 29.5 g/dL — ABNORMAL LOW (ref 30.0–36.0)
MCV: 104.1 fL — ABNORMAL HIGH (ref 80.0–100.0)
Platelets: 253 10*3/uL (ref 150–400)
RBC: 3.39 MIL/uL — ABNORMAL LOW (ref 3.87–5.11)
RDW: 16.1 % — ABNORMAL HIGH (ref 11.5–15.5)
WBC: 17.1 10*3/uL — ABNORMAL HIGH (ref 4.0–10.5)
nRBC: 0.1 % (ref 0.0–0.2)

## 2023-05-14 LAB — BASIC METABOLIC PANEL
Anion gap: 15 (ref 5–15)
BUN: 35 mg/dL — ABNORMAL HIGH (ref 8–23)
CO2: 28 mmol/L (ref 22–32)
Calcium: 9 mg/dL (ref 8.9–10.3)
Chloride: 97 mmol/L — ABNORMAL LOW (ref 98–111)
Creatinine, Ser: 1.24 mg/dL — ABNORMAL HIGH (ref 0.44–1.00)
GFR, Estimated: 47 mL/min — ABNORMAL LOW (ref >60)
Glucose, Bld: 222 mg/dL — ABNORMAL HIGH (ref 70–99)
Potassium: 4.1 mmol/L (ref 3.5–5.1)
Sodium: 140 mmol/L (ref 135–145)

## 2023-05-14 LAB — PHOSPHORUS: Phosphorus: 3.1 mg/dL (ref 2.5–4.6)

## 2023-05-14 LAB — BRAIN NATRIURETIC PEPTIDE: B Natriuretic Peptide: 138.4 pg/mL — ABNORMAL HIGH (ref 0.0–100.0)

## 2023-05-14 LAB — PROCALCITONIN: Procalcitonin: <0.1 ng/mL

## 2023-05-14 LAB — MAGNESIUM: Magnesium: 2.6 mg/dL — ABNORMAL HIGH (ref 1.7–2.4)

## 2023-05-14 MED ORDER — BISACODYL 5 MG PO TBEC
10.0000 mg | DELAYED_RELEASE_TABLET | Freq: Two times a day (BID) | ORAL | Status: DC
  Administered 2023-05-14 – 2023-05-18 (×9): 10 mg via ORAL
  Filled 2023-05-14 (×9): qty 2

## 2023-05-14 MED ORDER — MAGNESIUM CITRATE PO SOLN
1.0000 | Freq: Once | ORAL | Status: AC
  Administered 2023-05-14: 1 via ORAL
  Filled 2023-05-14: qty 296

## 2023-05-14 NOTE — Plan of Care (Signed)
   Problem: Clinical Measurements: Goal: Diagnostic test results will improve Outcome: Progressing

## 2023-05-14 NOTE — Progress Notes (Signed)
 PROGRESS NOTE    Kim [name]  FMW:[id number] DOB: May 10, 1955 DOA: 05/12/2023 PCP: Merna Huxley, NP    Brief Narrative:  68 year old with history of CHF EF 35%, nonobstructive CAD, COPD on chronic prednisone , chronic hypoxia 3 L nasal cannula CKD stage IIIa, hyperlipidemia, chronic pain, depression/anxiety admitted for shortness of breath.  Patient was found to be in COPD exacerbation started on steroids and bronchodilators.  Foley catheter also placed in the ED due to new urinary retention.   Assessment & Plan:  Principal Problem:   COPD with acute exacerbation (HCC) Active Problems:   Chronic respiratory failure with hypoxia (HCC)   Urinary retention   Shingles rash   Chronic HFrEF (heart failure with reduced ejection fraction) (HCC)   Chronic kidney disease, stage 3a (HCC)   Dyslipidemia   Anxiety and depression   Chronic pain syndrome   COPD with acute exacerbation Chronic respiratory failure with hypoxia: At home uses 2-3 L nasal cannula.  Continue supplemental oxygen here and wean as appropriate.  Follows outpatient pulmonary, Dr. Jude.  For now continue Solu-Medrol , bronchodilators scheduled and as needed.  I-S/flutter valve.  BNP overall unremarkable.  Check procalcitonin.  Finish 3 days of azithromycin    Urinary retention: New issue, EDP placed Foley on 2/3.  UA is negative.  Discontinue Foley catheter, perform voiding trial later today. She has outpatient appointment with urology on upcoming Tuesday.   Shingles of right buttocks: Continue Valtrex .   Chronic HFrEF: Euvolemic on admission.  Last EF 35-40% by TTE on 09/11/2022. Continue Lasix  and Aldactone .   Coronary artery disease: Chest pain-free Stable, denies chest pain.  Continue aspirin , rosuvastatin , Zetia .   CKD stage IIIa: Cr 1.3 (baseline) Renal function stable.   Hyperlipidemia: Continue rosuvastatin  and Zetia .   Depression/anxiety: Continue paroxetine  and Xanax  1 mg 3 times daily as needed.    Chronic pain syndrome: Continue home Norco as needed.   DVT prophylaxis: Lovenox     Code Status: Limited: Do not attempt resuscitation (DNR) -DNR-LIMITED -Do Not Intubate/DNI  Family Communication: Husband at bedside Continue hospital stay for significant abnormal breath sounds    Subjective: Seen at bedside, reports of constipation.  Tells me her breathing is slightly better compared to yesterday but still quite dyspneic with minimal exertion. Refusing Lovenox  shots.  She understands the risk.  Husband is present at bedside   Examination:  General exam: Appears calm and comfortable  Respiratory system: Bilateral diffuse diminished breath sounds Cardiovascular system: S1 & S2 heard, RRR. No JVD, murmurs, rubs, gallops or clicks. No pedal edema. Gastrointestinal system: Abdomen is nondistended, soft and nontender. No organomegaly or masses felt. Normal bowel sounds heard. Central nervous system: Alert and oriented. No focal neurological deficits. Extremities: Symmetric 5 x 5 power. Skin: No rashes, lesions or ulcers Psychiatry: Judgement and insight appear normal. Mood & affect appropriate.                Diet Orders (From admission, onward)     Start     Ordered   05/12/23 2048  Diet Heart Room service appropriate? Yes; Fluid consistency: Thin  Diet effective now       Question Answer Comment  Room service appropriate? Yes   Fluid consistency: Thin      05/12/23 2047            Objective: Vitals:   05/14/23 0609 05/14/23 1023 05/14/23 1030 05/14/23 1104  BP: 108/67     Pulse: 81 88 91   Resp: 18 (!) 22 (!)  22   Temp: 98.4 F (36.9 C)     TempSrc:      SpO2: 99% 99% 95% 94%    Intake/Output Summary (Last 24 hours) at 05/14/2023 1213 Last data filed at 05/14/2023 1000 Gross per 24 hour  Intake 480 ml  Output 2200 ml  Net -1720 ml   There were no vitals filed for this visit.  Scheduled Meds:  arformoterol   15 mcg Nebulization BID   aspirin  EC   81 mg Oral Q M,W,F   azithromycin   500 mg Oral Daily   bisacodyl   10 mg Oral BID   budesonide  (PULMICORT ) nebulizer solution  0.25 mg Nebulization BID   Chlorhexidine  Gluconate Cloth  6 each Topical Daily   enoxaparin  (LOVENOX ) injection  40 mg Subcutaneous Q24H   Ensifentrine   3 mg Nebulization BID   ezetimibe   10 mg Oral Daily   furosemide   80 mg Oral QHS   guaiFENesin   1,200 mg Oral Q12H   hydrocortisone   1 Application Rectal BID   ipratropium-albuterol   3 mL Nebulization TID   magnesium  citrate  1 Bottle Oral Once   methylPREDNISolone  (SOLU-MEDROL ) injection  40 mg Intravenous Q12H   PARoxetine   40 mg Oral Daily   potassium chloride   20 mEq Oral Daily   rosuvastatin   20 mg Oral Daily   sodium chloride  flush  3 mL Intravenous Q12H   spironolactone   12.5 mg Oral Daily   valACYclovir   1,000 mg Oral TID   Continuous Infusions:  Nutritional status     There is no height or weight on file to calculate BMI.  Data Reviewed:   CBC: Recent Labs  Lab 05/11/23 0345 05/12/23 1816 05/13/23 0452 05/14/23 0518  WBC 11.7* 8.7 8.4 17.1*  NEUTROABS 7.3 5.9  --   --   HGB 11.9* 11.8* 11.1* 10.4*  HCT 37.6 38.8 36.0 35.3*  MCV 97.7 99.7 99.2 104.1*  PLT 264 261 247 253   Basic Metabolic Panel: Recent Labs  Lab 05/11/23 0345 05/12/23 1816 05/13/23 0452 05/14/23 0518  NA 142 143 137 140  K 4.2 4.7 4.3 4.1  CL 106 105 100 97*  CO2 25 28 26 28   GLUCOSE 122* 133* 209* 222*  BUN 23 22 32* 35*  CREATININE 1.55* 1.28* 1.33* 1.24*  CALCIUM  9.4 9.3 8.8* 9.0  MG  --   --   --  2.6*  PHOS  --   --   --  3.1   GFR: CrCl cannot be calculated (Unknown ideal weight.). Liver Function Tests: Recent Labs  Lab 05/11/23 0345 05/12/23 1816  AST 29 31  ALT 25 28  ALKPHOS 48 49  BILITOT 0.6 0.6  PROT 5.6* 6.6  ALBUMIN 3.3* 3.7   No results for input(s): LIPASE, AMYLASE in the last 168 hours. No results for input(s): AMMONIA in the last 168 hours. Coagulation Profile: No  results for input(s): INR, PROTIME in the last 168 hours. Cardiac Enzymes: No results for input(s): CKTOTAL, CKMB, CKMBINDEX, TROPONINI in the last 168 hours. BNP (last 3 results) Recent Labs    04/23/23 1547  PROBNP 337*   HbA1C: No results for input(s): HGBA1C in the last 72 hours. CBG: No results for input(s): GLUCAP in the last 168 hours. Lipid Profile: No results for input(s): CHOL, HDL, LDLCALC, TRIG, CHOLHDL, LDLDIRECT in the last 72 hours. Thyroid  Function Tests: No results for input(s): TSH, T4TOTAL, FREET4, T3FREE, THYROIDAB in the last 72 hours. Anemia Panel: No results for input(s): VITAMINB12, FOLATE, FERRITIN, TIBC,  IRON, RETICCTPCT in the last 72 hours. Sepsis Labs: Recent Labs  Lab 05/11/23 0423 05/11/23 0756 05/14/23 0516  PROCALCITON  --   --  <0.10  LATICACIDVEN 1.4 1.6  --     Recent Results (from the past 240 hours)  Resp panel by RT-PCR (RSV, Flu A&B, Covid) Urine, Catheterized     Status: None   Collection Time: 05/11/23  3:45 AM   Specimen: Urine, Catheterized; Nasal Swab  Result Value Ref Range Status   SARS Coronavirus 2 by RT PCR NEGATIVE NEGATIVE Final   Influenza A by PCR NEGATIVE NEGATIVE Final   Influenza B by PCR NEGATIVE NEGATIVE Final    Comment: (NOTE) The Xpert Xpress SARS-CoV-2/FLU/RSV plus assay is intended as an aid in the diagnosis of influenza from Nasopharyngeal swab specimens and should not be used as a sole basis for treatment. Nasal washings and aspirates are unacceptable for Xpert Xpress SARS-CoV-2/FLU/RSV testing.  Fact Sheet for Patients: bloggercourse.com  Fact Sheet for Healthcare Providers: seriousbroker.it  This test is not yet approved or cleared by the [place]  FDA and has been authorized for detection and/or diagnosis of SARS-CoV-2 by FDA under an Emergency Use Authorization (EUA). This EUA will remain in  effect (meaning this test can be used) for the duration of the COVID-19 declaration under Section 564(b)(1) of the Act, 21 U.S.C. section 360bbb-3(b)(1), unless the authorization is terminated or revoked.     Resp Syncytial Virus by PCR NEGATIVE NEGATIVE Final    Comment: (NOTE) Fact Sheet for Patients: bloggercourse.com  Fact Sheet for Healthcare Providers: seriousbroker.it  This test is not yet approved or cleared by the [place]  FDA and has been authorized for detection and/or diagnosis of SARS-CoV-2 by FDA under an Emergency Use Authorization (EUA). This EUA will remain in effect (meaning this test can be used) for the duration of the COVID-19 declaration under Section 564(b)(1) of the Act, 21 U.S.C. section 360bbb-3(b)(1), unless the authorization is terminated or revoked.  Performed at [facility], [address], [place], [place] [address]   Culture, blood (single) w Reflex to ID Panel     Status: None (Preliminary result)   Collection Time: 05/11/23  4:27 AM   Specimen: BLOOD  Result Value Ref Range Status   Specimen Description BLOOD SITE NOT SPECIFIED  Final   Special Requests   Final    BOTTLES DRAWN AEROBIC AND ANAEROBIC Blood Culture adequate volume   Culture   Final    NO GROWTH 3 DAYS Performed at [facility], [address], [place], [place] [address]    Report Status PENDING  Incomplete         Radiology Studies: DG Chest Port 1 View Result Date: 05/12/2023 CLINICAL DATA:  Shortness of breath over the last 2 days. EXAM: PORTABLE CHEST 1 VIEW COMPARISON:  05/11/2023 FINDINGS: Artifact overlies the chest. Heart size is normal. Chronic aortic atherosclerotic calcification is noted. The lungs are clear. No infiltrate, collapse or effusion. Mild chronic interstitial markings noted in the lower lungs. IMPRESSION: No active disease. Aortic atherosclerosis. Mild chronic interstitial markings in  the lower lungs. Electronically Signed   By: Oneil Officer M.D.   On: 05/12/2023 17:22           LOS: 1 day   Time spent= 35 mins    [place] JAYSON Dare, MD [facility]  If 7PM-7AM, please contact night-coverage  05/14/2023, 12:13 PM

## 2023-05-14 NOTE — Evaluation (Signed)
 Occupational Therapy Evaluation Patient Details Name: Kim [name] MRN: [id number] DOB: 12/12/1955 Today's Date: 05/14/2023   History of Present Illness 68 y/o F admitted to [facility] on 2/4 for shortness of breath and found to be in COPD excerbation and with possible shingles rash to RT buttock.  Pt was admitted to [facility] on 7/4 for worsening shortness of breath as well. Pt with past admission 6/29 - 7/2 and 6/5 - 6/13 for COPD exacerbations. PMHx: COPD on home O2, HFrEF, numerous hospitalizations for COPD exacerbation, CAD, HTN, anxiety/depression, SAH   Clinical Impression   Patient is currently requiring assistance with ADLs including Max assist with Lower body ADLs, up to moderate-Max assist with Upper body ADLs,  as well as inability to tolerate any functional stands transfers to toilet.    Current level of function is below patient's typical baseline which pt reports has not really bounced back since her last [facility] hospitalization in July.  During this evaluation, patient was limited by generalized weakness, impaired activity tolerance, and 8/109 pain from back of neck to tailbone, all of which has the potential to impact patient's safety and independence during functional mobility, as well as performance for ADLs.  Patient lives at home, with her husband who is able to provide 24/7 supervision and assistance with a good support system of friends.   Patient demonstrates fair to poor rehab potential with palliative following, and should benefit from continued skilled occupational therapy services while in acute care to maximize safety, independence and quality of life at home.  Continued occupational therapy services at home are recommended.  ?        If plan is discharge home, recommend the following: A little help with walking and/or transfers;A little help with bathing/dressing/bathroom;Assistance with cooking/housework;Assist for transportation    Functional Status Assessment  Patient has had  a recent decline in their functional status and demonstrates the ability to make significant improvements in function in a reasonable and predictable amount of time.  Equipment Recommendations  Other (comment) (Long handled adaptive equipment)    Recommendations for Other Services       Precautions / Restrictions Precautions Precautions: Fall Precaution Comments: Mon O2. and HR Restrictions Weight Bearing Restrictions Per Provider Order: No      Mobility Bed Mobility Overal bed mobility: Needs Assistance Bed Mobility: Supine to Sit     Supine to sit: Supervision, HOB elevated, Used rails     General bed mobility comments: Pt raised HOB to max to assist her. USed rails. PT reports she often sleeps in her lift recliner for ease.    Transfers                          Balance Overall balance assessment: Needs assistance   Sitting balance-Leahy Scale: Fair Sitting balance - Comments: Leans against elevted HOB due to fatigue.                                   ADL either performed or assessed with clinical judgement   ADL Overall ADL's : Needs assistance/impaired Eating/Feeding: Bed level;Set up;Minimal assistance Eating/Feeding Details (indicate cue type and reason): Taking breaks   Grooming Details (indicate cue type and reason): Once EOB pt expressed that she cannot tolerate any activity due to fatigue and SHOB. Pt wanted HR to decrease before activity, however HR staying 118-123 with EOB sitting. Pt refusing to lie  down and wanted to sit up for breathing. Upper Body Bathing: Maximal assistance Upper Body Bathing Details (indicate cue type and reason): due to fatigue Lower Body Bathing: Maximal assistance;Bed level Lower Body Bathing Details (indicate cue type and reason): due to fatigue Upper Body Dressing : Moderate assistance;Sitting Upper Body Dressing Details (indicate cue type and reason): Pt able to adjust her own gown while siting EOB but  quick to fatigue. Lower Body Dressing: Total assistance;Bed level     Toilet Transfer Details (indicate cue type and reason): Unable to tolerate standing this date.         Functional mobility during ADLs: Supervision/safety (bed level only)       Vision   Vision Assessment?: No apparent visual deficits     Perception         Praxis         Pertinent Vitals/Pain Pain Assessment Pain Assessment: 0-10 Pain Score: 8  Pain Location: Hurts to breath and pain from back of neck to base of spine and tailbone  10/10 down the spine and lower back. Pain Descriptors / Indicators: Throbbing, Sore, Penetrating, Tender, Guarding Pain Intervention(s): Limited activity within patient's tolerance, Monitored during session, Premedicated before session     Extremity/Trunk Assessment Upper Extremity Assessment Upper Extremity Assessment: Generalized weakness   Lower Extremity Assessment Lower Extremity Assessment: Defer to PT evaluation   Cervical / Trunk Assessment Cervical / Trunk Assessment: Other exceptions Cervical / Trunk Exceptions: Tremouls to whole body for 1 week.   Communication Communication Communication: No apparent difficulties   Cognition Arousal: Alert Behavior During Therapy: WFL for tasks assessed/performed Overall Cognitive Status: Within Functional Limits for tasks assessed                                       General Comments       Exercises     Shoulder Instructions      Home Living Family/patient expects to be discharged to:: Private residence Living Arrangements: Spouse/significant other Available Help at Discharge: Family;Available 24 hours/day (Also has friends PRN to assist to help spouse.) Type of Home: House Home Access: Ramped entrance;Level entry Entrance Stairs-Number of Steps: Ramp has B rails Entrance Stairs-Rails: Left Home Layout: Two level Alternate Level Stairs-Number of Steps: Has stair chair lift to access  2nd floor. Bedroom downstairs but no bathroom. Has a potty chair.   Bathroom Shower/Tub: Tub/shower unit (Also has Walk in shower but prefers her tub/shower.)   Bathroom Toilet: Standard     Home Equipment: Agricultural Consultant (2 wheels);Rollator (4 wheels);Transport chair;Shower seat;Shower seat - built in;BSC/3in1;Hand held Producer, Television/film/video   Additional Comments: 2-3L O2 at baseline. BIPAP PRN days and all night.      Prior Functioning/Environment Prior Level of Function : Needs assist;History of Falls (last six months)             Mobility Comments: Very limited ambulation since home from [facility] in July. Use of rollator or uses Transport chair best she can using her feet.  Pt admits to a falls since her [facility] hospitalization in July. Pt endorses 4 bad falls with open wound/injury to LT elbow. Pt reports that these are due to loss on consciousness. Pt endorsed h/o postive orthostatic hypotension. ADLs Comments: Pt's husband assists with bathing of back and lower body, and assist for dressing, husband helps with household tasks and provides transportation. Pt and husband set  up medication box together each week. Pt reports that amount of assist that she needs has increased since her July hospitalization.        OT Problem List: Pain;Decreased activity tolerance;Decreased strength;Impaired balance (sitting and/or standing);Decreased knowledge of use of DME or AE      OT Treatment/Interventions: Self-care/ADL training;Therapeutic activities;Energy conservation;Patient/family education;Balance training;DME and/or AE instruction    OT Goals(Current goals can be found in the care plan section) Acute Rehab OT Goals Patient Stated Goal: To go home and not rehab. OT Goal Formulation: With patient Time For Goal Achievement: 05/28/23 Potential to Achieve Goals: Poor ADL Goals Pt Will Transfer to Toilet: with supervision;stand pivot transfer;bedside commode Pt Will Perform  Toileting - Clothing Manipulation and hygiene: with adaptive equipment;with caregiver independent in assisting;sitting/lateral leans;with supervision Additional ADL Goal #1: Patient will identify at least 3 energy conservation strategies to employ at home in order to maximize function and quality of life and decrease caregiver burden while preventing exacerbation of symptoms and rehospitalization.   Pt will demonstrate use of adaptive equipment to support energy conservation at home. Additional ADL Goal #2: Pt will demonstrate pacing and use of AE for LE dressing and/or bathing with no more than setup and supervision assist. Additional ADL Goal #3: Pt will complete 2 ADL tasks in least restritive position that pt can tolerate, demonstrating energy conservation strategies with RPE no greater than 7/10 and VSS. SABRA  OT Frequency: Min 1X/week    Co-evaluation              AM-PAC OT 6 Clicks Daily Activity     Outcome Measure Help from another person eating meals?: A Little Help from another person taking care of personal grooming?: A Little Help from another person toileting, which includes using toliet, bedpan, or urinal?: A Lot Help from another person bathing (including washing, rinsing, drying)?: A Lot Help from another person to put on and taking off regular upper body clothing?: A Lot Help from another person to put on and taking off regular lower body clothing?: A Lot 6 Click Score: 14   End of Session Equipment Utilized During Treatment: Oxygen  Activity Tolerance: Patient limited by fatigue;Treatment limited secondary to medical complications (Comment) Patient left: with call bell/phone within reach;with bed alarm set;with nursing/sitter in room;Other (comment) (EOB per pt request. CNA in room with pt)  OT Visit Diagnosis: Pain;Dizziness and giddiness (R42);Muscle weakness (generalized) (M62.81);History of falling (Z91.81);Repeated falls (R29.6)                Time: 1256-1340 OT  Time Calculation (min): 44 min Charges:  OT General Charges $OT Visit: 1 Visit OT Evaluation $OT Eval Low Complexity: 1 Low OT Treatments $Self Care/Home Management : 8-22 mins $Therapeutic Activity: 8-22 mins  Delon, OT Acute Rehab Services Office: [phone] 05/14/2023   Delon Falter 05/14/2023, 2:25 PM

## 2023-05-14 NOTE — Progress Notes (Signed)
 Foley cath out at 1100, pt has not yet voided except for being incontinent sm amts yellow urine w coughing episodes. Bladder scan = 158 ml and 104 ml. MD notified, ok to use purewick w pt.

## 2023-05-14 NOTE — Progress Notes (Signed)
   05/14/23 2348  BiPAP/CPAP/SIPAP  BiPAP/CPAP/SIPAP Pt Type Adult  BiPAP/CPAP/SIPAP Resmed  Mask Type Full face mask  Mask Size Medium  IPAP 12 cmH20  EPAP 6 cmH2O  FiO2 (%) 28 %  Patient Home Equipment Yes  Auto Titrate No

## 2023-05-15 DIAGNOSIS — J441 Chronic obstructive pulmonary disease with (acute) exacerbation: Secondary | ICD-10-CM | POA: Diagnosis not present

## 2023-05-15 LAB — BASIC METABOLIC PANEL
Anion gap: 10 (ref 5–15)
BUN: 36 mg/dL — ABNORMAL HIGH (ref 8–23)
CO2: 32 mmol/L (ref 22–32)
Calcium: 9.1 mg/dL (ref 8.9–10.3)
Chloride: 97 mmol/L — ABNORMAL LOW (ref 98–111)
Creatinine, Ser: 1.23 mg/dL — ABNORMAL HIGH (ref 0.44–1.00)
GFR, Estimated: 48 mL/min — ABNORMAL LOW (ref >60)
Glucose, Bld: 246 mg/dL — ABNORMAL HIGH (ref 70–99)
Potassium: 4.6 mmol/L (ref 3.5–5.1)
Sodium: 139 mmol/L (ref 135–145)

## 2023-05-15 LAB — MAGNESIUM: Magnesium: 3.1 mg/dL — ABNORMAL HIGH (ref 1.7–2.4)

## 2023-05-15 LAB — CBC
HCT: 35.2 % — ABNORMAL LOW (ref 36.0–46.0)
Hemoglobin: 11 g/dL — ABNORMAL LOW (ref 12.0–15.0)
MCH: 30.9 pg (ref 26.0–34.0)
MCHC: 31.3 g/dL (ref 30.0–36.0)
MCV: 98.9 fL (ref 80.0–100.0)
Platelets: 291 10*3/uL (ref 150–400)
RBC: 3.56 MIL/uL — ABNORMAL LOW (ref 3.87–5.11)
RDW: 16.3 % — ABNORMAL HIGH (ref 11.5–15.5)
WBC: 18.3 10*3/uL — ABNORMAL HIGH (ref 4.0–10.5)
nRBC: 0.2 % (ref 0.0–0.2)

## 2023-05-15 MED ORDER — IPRATROPIUM-ALBUTEROL 0.5-2.5 (3) MG/3ML IN SOLN
3.0000 mL | RESPIRATORY_TRACT | Status: DC | PRN
  Filled 2023-05-15: qty 3

## 2023-05-15 MED ORDER — ALUM & MAG HYDROXIDE-SIMETH 200-200-20 MG/5ML PO SUSP
30.0000 mL | ORAL | Status: DC | PRN
  Administered 2023-05-15: 30 mL via ORAL
  Filled 2023-05-15: qty 30

## 2023-05-15 MED ORDER — IPRATROPIUM-ALBUTEROL 0.5-2.5 (3) MG/3ML IN SOLN
3.0000 mL | Freq: Four times a day (QID) | RESPIRATORY_TRACT | Status: DC
  Administered 2023-05-15 – 2023-05-17 (×6): 3 mL via RESPIRATORY_TRACT
  Filled 2023-05-15 (×7): qty 3

## 2023-05-15 NOTE — Progress Notes (Signed)
 PROGRESS NOTE    Kim [name]  FMW:[id number] DOB: November 03, 1955 DOA: 05/12/2023 PCP: Merna Huxley, NP   Brief Narrative:  68 year old with history of CHF EF 35%, nonobstructive CAD, COPD on chronic prednisone , chronic hypoxia 3 L nasal cannula CKD stage IIIa, hyperlipidemia, chronic pain, depression/anxiety admitted for shortness of breath.  Patient was found to be in COPD exacerbation started on steroids and bronchodilators.  Foley catheter also placed in the ED due to new urinary retention.     Assessment & Plan:  Principal Problem:   COPD with acute exacerbation (HCC) Active Problems:   Chronic respiratory failure with hypoxia (HCC)   Urinary retention   Shingles rash   Chronic HFrEF (heart failure with reduced ejection fraction) (HCC)   Chronic kidney disease, stage 3a (HCC)   Dyslipidemia   Anxiety and depression   Chronic pain syndrome   COPD with acute exacerbation Chronic respiratory failure with hypoxia: At home uses 2-3 L nasal cannula, also CPAP/BiPAP at night..   -Here she has coarse breath sounds and diminished air movement bilateral bases of her lungs. -Continuing with scheduled bronchodilators  - continue supplemental oxygen here and wean as appropriate.  Follows outpatient pulmonary, Dr. Jude.  For now continue Solu-Medrol , bronchodilators scheduled and as needed.  I-S/flutter valve.  BNP overall unremarkable.  Finish 3 days of azithromycin    Urinary retention: New issue, EDP placed Foley on 2/3.  UA is negative.  Discontinue Foley catheter, perform voiding trial later today. She has outpatient appointment with urology on upcoming Tuesday.   Shingles of right buttocks: -Rash noted right buttocks.  Does not appear to be consistent with shingles.  Denies any actual pain. -She and her husband are both convinced that her current symptoms all resulted from administration of Valtrex . -Has seems to be less likely shingles(nontender rash) will stop Valtrex .   Chronic  HFrEF: Euvolemic on admission.  Last EF 35-40% by TTE on 09/11/2022. Continue Lasix  and Aldactone .   Coronary artery disease: Chest pain-free Stable, denies chest pain.  Continue aspirin , rosuvastatin , Zetia .   CKD stage IIIa: Cr 1.3 (baseline) Renal function stable.   Hyperlipidemia: Continue rosuvastatin  and Zetia .   Depression/anxiety: Continue paroxetine  and Xanax  1 mg 3 times daily as needed.   Chronic pain syndrome: Continue home Norco as needed.     DVT prophylaxis: Lovenox     Code Status: Limited: Do not attempt resuscitation (DNR) -DNR-LIMITED -Do Not Intubate/DNI  Family Communication: Husband at bedside Continue hospital stay for significant abnormal breath sounds     Subjective: Seen at bedside, had constipation but that has now resolved with BM x 1 today. Tells me her breathing is slightly better compared to yesterday but still quite dyspneic with minimal exertion. Refusing Lovenox  shots.  She understands the risk.  Husband is present at bedside     Examination:   General exam: Appears calm and comfortable  Respiratory system: Bilateral diffuse coarse breath sounds throughout Cardiovascular system: S1 & S2 heard, RRR. No JVD, murmurs, rubs, gallops or clicks. No pedal edema. Gastrointestinal system: Abdomen is nondistended, soft and nontender. No organomegaly or masses felt. Normal bowel sounds heard. Central nervous system: Alert and oriented. No focal neurological deficits. Extremities: Symmetric 5 x 5 power. Skin: No rashes, lesions or ulcers Psychiatry: Judgement and insight appear normal. Mood & affect appropriate.         Diet Orders (From admission, onward)     Start     Ordered   05/12/23 2048  Diet Heart Room service  appropriate? Yes; Fluid consistency: Thin  Diet effective now       Question Answer Comment  Room service appropriate? Yes   Fluid consistency: Thin      05/12/23 2047            Objective: Vitals:   05/15/23 0630  05/15/23 0938 05/15/23 1348 05/15/23 1410  BP: 130/71  117/77   Pulse: 82  (!) 102   Resp:   16   Temp:   97.9 F (36.6 C)   TempSrc:   Oral   SpO2: 98% 98% 97% 97%  Weight:      Height:        Intake/Output Summary (Last 24 hours) at 05/15/2023 1433 Last data filed at 05/15/2023 1000 Gross per 24 hour  Intake 810 ml  Output 1100 ml  Net -290 ml   Filed Weights   05/14/23 1411  Weight: 72.5 kg    Scheduled Meds:  arformoterol   15 mcg Nebulization BID   aspirin  EC  81 mg Oral Q M,W,F   azithromycin   500 mg Oral Daily   bisacodyl   10 mg Oral BID   budesonide  (PULMICORT ) nebulizer solution  0.25 mg Nebulization BID   Chlorhexidine  Gluconate Cloth  6 each Topical Daily   enoxaparin  (LOVENOX ) injection  40 mg Subcutaneous Q24H   Ensifentrine   3 mg Nebulization BID   ezetimibe   10 mg Oral Daily   furosemide   80 mg Oral QHS   guaiFENesin   1,200 mg Oral Q12H   hydrocortisone   1 Application Rectal BID   ipratropium-albuterol   3 mL Nebulization TID   methylPREDNISolone  (SOLU-MEDROL ) injection  40 mg Intravenous Q12H   PARoxetine   40 mg Oral Daily   potassium chloride   20 mEq Oral Daily   rosuvastatin   20 mg Oral Daily   sodium chloride  flush  3 mL Intravenous Q12H   spironolactone   12.5 mg Oral Daily   valACYclovir   1,000 mg Oral TID   Continuous Infusions:  Nutritional status     Body mass index is 29.71 kg/m.  Data Reviewed:   CBC: Recent Labs  Lab 05/11/23 0345 05/12/23 1816 05/13/23 0452 05/14/23 0518 05/15/23 0452  WBC 11.7* 8.7 8.4 17.1* 18.3*  NEUTROABS 7.3 5.9  --   --   --   HGB 11.9* 11.8* 11.1* 10.4* 11.0*  HCT 37.6 38.8 36.0 35.3* 35.2*  MCV 97.7 99.7 99.2 104.1* 98.9  PLT 264 261 247 253 291   Basic Metabolic Panel: Recent Labs  Lab 05/11/23 0345 05/12/23 1816 05/13/23 0452 05/14/23 0518 05/15/23 0452  NA 142 143 137 140 139  K 4.2 4.7 4.3 4.1 4.6  CL 106 105 100 97* 97*  CO2 25 28 26 28  32  GLUCOSE 122* 133* 209* 222* 246*  BUN 23 22  32* 35* 36*  CREATININE 1.55* 1.28* 1.33* 1.24* 1.23*  CALCIUM  9.4 9.3 8.8* 9.0 9.1  MG  --   --   --  2.6* 3.1*  PHOS  --   --   --  3.1  --    GFR: Estimated Creatinine Clearance: 39.8 mL/min (A) (by C-G formula based on SCr of 1.23 mg/dL (H)). Liver Function Tests: Recent Labs  Lab 05/11/23 0345 05/12/23 1816  AST 29 31  ALT 25 28  ALKPHOS 48 49  BILITOT 0.6 0.6  PROT 5.6* 6.6  ALBUMIN 3.3* 3.7   No results for input(s): LIPASE, AMYLASE in the last 168 hours. No results for input(s): AMMONIA in the last 168 hours.  Coagulation Profile: No results for input(s): INR, PROTIME in the last 168 hours. Cardiac Enzymes: No results for input(s): CKTOTAL, CKMB, CKMBINDEX, TROPONINI in the last 168 hours. BNP (last 3 results) Recent Labs    04/23/23 1547  PROBNP 337*   HbA1C: No results for input(s): HGBA1C in the last 72 hours. CBG: No results for input(s): GLUCAP in the last 168 hours. Lipid Profile: No results for input(s): CHOL, HDL, LDLCALC, TRIG, CHOLHDL, LDLDIRECT in the last 72 hours. Thyroid  Function Tests: No results for input(s): TSH, T4TOTAL, FREET4, T3FREE, THYROIDAB in the last 72 hours. Anemia Panel: No results for input(s): VITAMINB12, FOLATE, FERRITIN, TIBC, IRON, RETICCTPCT in the last 72 hours. Sepsis Labs: Recent Labs  Lab 05/11/23 0423 05/11/23 0756 05/14/23 0516  PROCALCITON  --   --  <0.10  LATICACIDVEN 1.4 1.6  --     Recent Results (from the past 240 hours)  Resp panel by RT-PCR (RSV, Flu A&B, Covid) Urine, Catheterized     Status: None   Collection Time: 05/11/23  3:45 AM   Specimen: Urine, Catheterized; Nasal Swab  Result Value Ref Range Status   SARS Coronavirus 2 by RT PCR NEGATIVE NEGATIVE Final   Influenza A by PCR NEGATIVE NEGATIVE Final   Influenza B by PCR NEGATIVE NEGATIVE Final    Comment: (NOTE) The Xpert Xpress SARS-CoV-2/FLU/RSV plus assay is intended as an aid in the  diagnosis of influenza from Nasopharyngeal swab specimens and should not be used as a sole basis for treatment. Nasal washings and aspirates are unacceptable for Xpert Xpress SARS-CoV-2/FLU/RSV testing.  Fact Sheet for Patients: bloggercourse.com  Fact Sheet for Healthcare Providers: seriousbroker.it  This test is not yet approved or cleared by the [place]  FDA and has been authorized for detection and/or diagnosis of SARS-CoV-2 by FDA under an Emergency Use Authorization (EUA). This EUA will remain in effect (meaning this test can be used) for the duration of the COVID-19 declaration under Section 564(b)(1) of the Act, 21 U.S.C. section 360bbb-3(b)(1), unless the authorization is terminated or revoked.     Resp Syncytial Virus by PCR NEGATIVE NEGATIVE Final    Comment: (NOTE) Fact Sheet for Patients: bloggercourse.com  Fact Sheet for Healthcare Providers: seriousbroker.it  This test is not yet approved or cleared by the [place]  FDA and has been authorized for detection and/or diagnosis of SARS-CoV-2 by FDA under an Emergency Use Authorization (EUA). This EUA will remain in effect (meaning this test can be used) for the duration of the COVID-19 declaration under Section 564(b)(1) of the Act, 21 U.S.C. section 360bbb-3(b)(1), unless the authorization is terminated or revoked.  Performed at [facility], [address], [place], [place] [address]   Culture, blood (single) w Reflex to ID Panel     Status: None (Preliminary result)   Collection Time: 05/11/23  4:27 AM   Specimen: BLOOD  Result Value Ref Range Status   Specimen Description BLOOD SITE NOT SPECIFIED  Final   Special Requests   Final    BOTTLES DRAWN AEROBIC AND ANAEROBIC Blood Culture adequate volume   Culture   Final    NO GROWTH 4 DAYS Performed at [facility], [address],  [place], [place] [address]    Report Status PENDING  Incomplete         Radiology Studies: No results found.          LOS: 2 days   Time spent= 35 mins    Reyes VEAR Gaw, MD  [facility]  If 7PM-7AM, please contact night-coverage

## 2023-05-15 NOTE — Plan of Care (Signed)
  Problem: Education: Goal: Knowledge of General Education information will improve Description: Including pain rating scale, medication(s)/side effects and non-pharmacologic comfort measures Outcome: Progressing   Problem: Health Behavior/Discharge Planning: Goal: Ability to manage health-related needs will improve Outcome: Progressing   Problem: Clinical Measurements: Goal: Ability to maintain clinical measurements within normal limits will improve Outcome: Progressing Goal: Will remain free from infection Outcome: Progressing Goal: Diagnostic test results will improve Outcome: Progressing Goal: Respiratory complications will improve Outcome: Progressing Goal: Cardiovascular complication will be avoided Outcome: Progressing   Problem: Activity: Goal: Risk for activity intolerance will decrease Outcome: Progressing   Problem: Nutrition: Goal: Adequate nutrition will be maintained Outcome: Progressing   Problem: Coping: Goal: Level of anxiety will decrease Outcome: Progressing   Problem: Elimination: Goal: Will not experience complications related to bowel motility Outcome: Progressing Goal: Will not experience complications related to urinary retention Outcome: Progressing   Problem: Pain Managment: Goal: General experience of comfort will improve and/or be controlled Outcome: Progressing   Problem: Safety: Goal: Ability to remain free from injury will improve Outcome: Progressing   Problem: Skin Integrity: Goal: Risk for impaired skin integrity will decrease Outcome: Progressing   Problem: Education: Goal: Knowledge of disease or condition will improve Outcome: Progressing Goal: Knowledge of the prescribed therapeutic regimen will improve Outcome: Progressing Goal: Individualized Educational Video(s) Outcome: Progressing   Problem: Respiratory: Goal: Ability to maintain a clear airway will improve Outcome: Progressing Goal: Levels of oxygenation will  improve Outcome: Progressing Goal: Ability to maintain adequate ventilation will improve Outcome: Progressing

## 2023-05-15 NOTE — Plan of Care (Signed)
   Problem: Education: Goal: Knowledge of General Education information will improve Description Including pain rating scale, medication(s)/side effects and non-pharmacologic comfort measures Outcome: Progressing   Problem: Health Behavior/Discharge Planning: Goal: Ability to manage health-related needs will improve Outcome: Progressing

## 2023-05-15 NOTE — Progress Notes (Signed)
   05/15/23 2245  BiPAP/CPAP/SIPAP  BiPAP/CPAP/SIPAP Pt Type Adult (patient prefers self placement when ready for bed.  Spouse in room to assist with placement)  BiPAP/CPAP/SIPAP Resmed  Mask Type Full face mask  Dentures removed? Not applicable  Mask Size Medium (patient mask and tubing from home)  IPAP 12 cmH20  EPAP 6 cmH2O  Flow Rate 3 lpm (bled into circuit)  Patient Home Equipment Yes (only pt mask and tubing)  Auto Titrate No  CPAP/SIPAP surface wiped down Yes

## 2023-05-16 DIAGNOSIS — J441 Chronic obstructive pulmonary disease with (acute) exacerbation: Secondary | ICD-10-CM | POA: Diagnosis not present

## 2023-05-16 LAB — BASIC METABOLIC PANEL
Anion gap: 11 (ref 5–15)
BUN: 42 mg/dL — ABNORMAL HIGH (ref 8–23)
CO2: 32 mmol/L (ref 22–32)
Calcium: 8.7 mg/dL — ABNORMAL LOW (ref 8.9–10.3)
Chloride: 94 mmol/L — ABNORMAL LOW (ref 98–111)
Creatinine, Ser: 1.63 mg/dL — ABNORMAL HIGH (ref 0.44–1.00)
GFR, Estimated: 34 mL/min — ABNORMAL LOW (ref >60)
Glucose, Bld: 281 mg/dL — ABNORMAL HIGH (ref 70–99)
Potassium: 4.8 mmol/L (ref 3.5–5.1)
Sodium: 137 mmol/L (ref 135–145)

## 2023-05-16 LAB — CULTURE, BLOOD (SINGLE)
Culture: NO GROWTH
Special Requests: ADEQUATE

## 2023-05-16 LAB — CBC
HCT: 33.5 % — ABNORMAL LOW (ref 36.0–46.0)
Hemoglobin: 10.6 g/dL — ABNORMAL LOW (ref 12.0–15.0)
MCH: 31.6 pg (ref 26.0–34.0)
MCHC: 31.6 g/dL (ref 30.0–36.0)
MCV: 100 fL (ref 80.0–100.0)
Platelets: 284 10*3/uL (ref 150–400)
RBC: 3.35 MIL/uL — ABNORMAL LOW (ref 3.87–5.11)
RDW: 16.2 % — ABNORMAL HIGH (ref 11.5–15.5)
WBC: 17.7 10*3/uL — ABNORMAL HIGH (ref 4.0–10.5)
nRBC: 0.5 % — ABNORMAL HIGH (ref 0.0–0.2)

## 2023-05-16 MED ORDER — PREDNISONE 20 MG PO TABS
60.0000 mg | ORAL_TABLET | Freq: Every day | ORAL | Status: AC
  Administered 2023-05-16 – 2023-05-18 (×3): 60 mg via ORAL
  Filled 2023-05-16 (×3): qty 3

## 2023-05-16 NOTE — Evaluation (Signed)
 Physical Therapy Evaluation Patient Details Name: Kim [name] MRN: [id number] DOB: [birth year] Today's Date: 05/16/2023  History of Present Illness  68 year old female admitted to [facility] on 05/12/23 for COPD with acute exacerbation and chronic respiratory failure with hypoxia.    PMHx: COPD on home O2 (numerous hospitalizations for COPD exacerbations), HFrEF, CAD, HTN, anxiety/depression, SAH  Clinical Impression  Pt admitted with above diagnosis.  Pt currently with functional limitations due to the deficits listed below (see PT Problem List). Pt will benefit from acute skilled PT to increase their independence and safety with mobility to allow discharge.  Pt has pulse oximeter from home and actively monitors her HR with movement.  Pt states her MD told her to stop and relax if HR goes above 110 bpm and wait for HR to decrease below 110 bpm.  Pt's HR mostly in 120s with sitting EOB (up to 126 bpm). Pt requested increases in oxygen with her bed mobility as well (on 3L at rest and increased to 4L and then 5L with SpO2 94% sitting EOB on 5L)  Pt appears very anxious with symptoms and monitoring numbers (just received anxiety med prior to therapist arrival per spouse).  Pt felt unable to stand or transfer at this time.  Pt typically transfers at home due to cardiopulmonary status.  Spouse present and very supportive.  Spouse would like HHPT upon d/c (states pt had it once before and it was helpful).         If plan is discharge home, recommend the following: A little help with walking and/or transfers;A little help with bathing/dressing/bathroom;Assistance with cooking/housework;Assist for transportation;Help with stairs or ramp for entrance   Can travel by private vehicle        Equipment Recommendations Wheelchair (measurements PT)  Recommendations for Other Services       Functional Status Assessment Patient has had a recent decline in their functional status and demonstrates the ability to make  significant improvements in function in a reasonable and predictable amount of time.     Precautions / Restrictions Precautions Precautions: Fall Precaution Comments: monitor O2, HR Restrictions Weight Bearing Restrictions Per Provider Order: No      Mobility  Bed Mobility Overal bed mobility: Needs Assistance Bed Mobility: Supine to Sit, Sit to Supine     Supine to sit: Min assist, HOB elevated Sit to supine: Min assist, HOB elevated   General bed mobility comments: spouse provided assist per pt request    Transfers                        Ambulation/Gait                  Stairs            Wheelchair Mobility     Tilt Bed    Modified Rankin (Stroke Patients Only)       Balance Overall balance assessment: Needs assistance   Sitting balance-Leahy Scale: Fair                                       Pertinent Vitals/Pain Pain Assessment Pain Assessment: No/denies pain Pain Intervention(s): Monitored during session, Repositioned    Home Living Family/patient expects to be discharged to:: Private residence Living Arrangements: Spouse/significant other Available Help at Discharge: Family;Available 24 hours/day Type of Home: House Home Access: Ramped entrance;Level entry Entrance  Stairs-Rails: Left Entrance Stairs-Number of Steps: Ramp has B rails Alternate Level Stairs-Number of Steps: Has stair chair lift to access 2nd floor. Bedroom downstairs but no bathroom. Has a potty chair. Home Layout: Two level Home Equipment: Agricultural Consultant (2 wheels);Rollator (4 wheels);Transport chair;Shower seat;Shower seat - built in;BSC/3in1;Hand held Producer, Television/film/video Additional Comments: 2-3L O2 at baseline. BIPAP PRN days and all night.    Prior Function Prior Level of Function : Needs assist;History of Falls (last six months)             Mobility Comments: Very limited ambulation since home from [facility] in July.  Use of rollator or uses Transport chair best she can using her feet.  Pt admits to a falls since her [facility] hospitalization in July. Pt endorses 4 bad falls with open wound/injury to LT elbow. Pt reports that these are due to loss on consciousness. Pt endorsed h/o postive orthostatic hypotension. ADLs Comments: Pt's husband assists with bathing of back and lower body, and assist for dressing, husband helps with household tasks and provides transportation. Pt and husband set up medication box together each week. Pt reports that amount of assist that she needs has increased since her July hospitalization.     Extremity/Trunk Assessment        Lower Extremity Assessment Lower Extremity Assessment: Generalized weakness       Communication   Communication Communication: No apparent difficulties  Cognition Arousal: Alert Behavior During Therapy: Anxious Overall Cognitive Status: Within Functional Limits for tasks assessed                                          General Comments      Exercises     Assessment/Plan    PT Assessment Patient needs continued PT services  PT Problem List Decreased strength;Decreased activity tolerance;Decreased mobility;Cardiopulmonary status limiting activity;Decreased knowledge of use of DME       PT Treatment Interventions DME instruction;Gait training;Balance training;Neuromuscular re-education;Patient/family education;Functional mobility training;Therapeutic activities;Wheelchair mobility training;Therapeutic exercise    PT Goals (Current goals can be found in the Care Plan section)  Acute Rehab PT Goals PT Goal Formulation: With patient/family Time For Goal Achievement: 05/30/23 Potential to Achieve Goals: Fair    Frequency Min 1X/week     Co-evaluation               AM-PAC PT 6 Clicks Mobility  Outcome Measure Help needed turning from your back to your side while in a flat bed without using bedrails?: A Little Help  needed moving from lying on your back to sitting on the side of a flat bed without using bedrails?: A Little Help needed moving to and from a bed to a chair (including a wheelchair)?: A Lot Help needed standing up from a chair using your arms (e.g., wheelchair or bedside chair)?: A Lot Help needed to walk in hospital room?: Total Help needed climbing 3-5 steps with a railing? : Total 6 Click Score: 12    End of Session Equipment Utilized During Treatment: Gait belt;Oxygen Activity Tolerance: Other (comment) (limited by anxiety, elevated HR) Patient left: in bed;with call bell/phone within reach;with family/visitor present;with nursing/sitter in room Nurse Communication: Mobility status PT Visit Diagnosis: History of falling (Z91.81);Muscle weakness (generalized) (M62.81)    Time: 8953-8895 PT Time Calculation (min) (ACUTE ONLY): 18 min   Charges:   PT Evaluation $PT Eval Low Complexity:  1 Low   PT General Charges $$ ACUTE PT VISIT: 1 Visit        Tari PT, DPT Physical Therapist Acute Rehabilitation Services Office: [phone]   Tari LITTIE Farm 05/16/2023, 12:58 PM

## 2023-05-16 NOTE — Progress Notes (Signed)
   05/16/23 2223  BiPAP/CPAP/SIPAP  BiPAP/CPAP/SIPAP Pt Type Adult  BiPAP/CPAP/SIPAP Resmed  Mask Type Full face mask  Mask Size Medium  IPAP 12 cmH20  EPAP 6 cmH2O  Flow Rate 3 lpm  Patient Home Equipment No  Auto Titrate No

## 2023-05-16 NOTE — Progress Notes (Signed)
 Ensifentrine  BID is a home medication that PT stated previously that she does take at home but may discontinue- RT did not witnessed PT take this medication during scheduled visit.

## 2023-05-16 NOTE — Plan of Care (Signed)
  Problem: Education: Goal: Knowledge of General Education information will improve Description: Including pain rating scale, medication(s)/side effects and non-pharmacologic comfort measures Outcome: Progressing   Problem: Pain Managment: Goal: General experience of comfort will improve and/or be controlled Outcome: Progressing

## 2023-05-16 NOTE — Progress Notes (Addendum)
 PROGRESS NOTE    Kim [name]  FMW:[id number] DOB: [birth year] DOA: 05/12/2023 PCP: Merna Huxley, NP   Brief Narrative:  68 year old with history of CHF EF 35%, nonobstructive CAD, COPD on chronic prednisone , chronic hypoxia 3 L nasal cannula CKD stage IIIa, hyperlipidemia, chronic pain, depression/anxiety admitted for shortness of breath.  Patient was found to be in COPD exacerbation started on steroids and bronchodilators.  Foley catheter also placed in the ED due to new urinary retention.     Assessment & Plan:  Principal Problem:   COPD with acute exacerbation (HCC) Active Problems:   Chronic respiratory failure with hypoxia (HCC)   Urinary retention   Shingles rash   Chronic HFrEF (heart failure with reduced ejection fraction) (HCC)   Chronic kidney disease, stage 3a (HCC)   Dyslipidemia   Anxiety and depression   Chronic pain syndrome   COPD with acute exacerbation Chronic respiratory failure with hypoxia: -Breathing a little better today.  Still with coarse breath sounds bilateral bases.   -Continuing with scheduled bronchodilators  - continue supplemental oxygen here and wean as appropriate.  Follows outpatient pulmonary, Dr. Jude.  For now continue Solu-Medrol , bronchodilators scheduled and as needed.  I-S/flutter valve.  BNP overall unremarkable.  Finish 3 days of azithromycin  -Slowly improving.   Urinary retention: New issue, EDP placed Foley on 2/3.  UA is negative.  Discontinue Foley catheter, perform voiding trial later today. She has outpatient appointment with urology on upcoming Tuesday.   Rash of right buttocks: -Rash noted right buttocks.  Initially diagnosed with shingles but rash distribution no longer appears to be consistent with shingles.  Denies any actual pain.  This is also slowly improving -She and her husband are both convinced that her current symptoms all resulted from administration of Valtrex . -Because this less likely shingles(nontender rash)  will stop Valtrex .   Chronic HFrEF: Euvolemic on admission.  Last EF 35-40% by TTE on 09/11/2022. Continue Lasix  and Aldactone .   Coronary artery disease: Chest pain-free Stable, denies chest pain.  Continue aspirin , rosuvastatin , Zetia .   CKD stage IIIa: Cr 1.3 (baseline) Renal function stable. - SCr elevated somewhat today.  Will follow   Hyperlipidemia: Continue rosuvastatin  and Zetia .   Depression/anxiety: Continue paroxetine  and Xanax  1 mg 3 times daily as needed.   Chronic pain syndrome: Continue home Norco as needed.     DVT prophylaxis: Lovenox     Code Status: Limited: Do not attempt resuscitation (DNR) -DNR-LIMITED -Do Not Intubate/DNI  Family Communication: Husband at bedside Continue hospital stay for significant abnormal breath sounds     Subjective: Seen at bedside, had constipation but that has now resolved with BM x 1 today. Tells me her breathing is slightly better compared to yesterday. Refusing Lovenox  shots.  She understands the risk.  Husband is present at bedside     Examination:   General exam: Appears calm and comfortable  Respiratory system: Bilateral diffuse coarse breath sounds throughout Cardiovascular system: S1 & S2 heard, RRR. No JVD, murmurs, rubs, gallops or clicks. No pedal edema. Gastrointestinal system: Abdomen is nondistended, soft and nontender. No organomegaly or masses felt. Normal bowel sounds heard. Central nervous system: Alert and oriented. No focal neurological deficits. Extremities: Symmetric 5 x 5 power. Skin: No rashes, lesions or ulcers Psychiatry: Judgement and insight appear normal. Mood & affect appropriate.         Diet Orders (From admission, onward)     Start     Ordered   05/12/23 2048  Diet Heart Room  service appropriate? Yes; Fluid consistency: Thin  Diet effective now       Question Answer Comment  Room service appropriate? Yes   Fluid consistency: Thin      05/12/23 2047             Objective: Vitals:   05/15/23 2003 05/15/23 2050 05/16/23 0643 05/16/23 1000  BP:  (!) 148/84 111/72   Pulse:  100 86   Resp:  (!) 22 18   Temp:  97.7 F (36.5 C) 97.7 F (36.5 C)   TempSrc:  Oral Oral   SpO2: 96% 95% 94% 94%  Weight:      Height:        Intake/Output Summary (Last 24 hours) at 05/16/2023 1417 Last data filed at 05/16/2023 1000 Gross per 24 hour  Intake 600 ml  Output 2190 ml  Net -1590 ml   Filed Weights   05/14/23 1411  Weight: 72.5 kg    Scheduled Meds:  arformoterol   15 mcg Nebulization BID   aspirin  EC  81 mg Oral Q M,W,F   bisacodyl   10 mg Oral BID   budesonide  (PULMICORT ) nebulizer solution  0.25 mg Nebulization BID   Chlorhexidine  Gluconate Cloth  6 each Topical Daily   enoxaparin  (LOVENOX ) injection  40 mg Subcutaneous Q24H   Ensifentrine   3 mg Nebulization BID   ezetimibe   10 mg Oral Daily   furosemide   80 mg Oral QHS   guaiFENesin   1,200 mg Oral Q12H   hydrocortisone   1 Application Rectal BID   ipratropium-albuterol   3 mL Nebulization Q6H   PARoxetine   40 mg Oral Daily   potassium chloride   20 mEq Oral Daily   predniSONE   60 mg Oral QAC breakfast   rosuvastatin   20 mg Oral Daily   sodium chloride  flush  3 mL Intravenous Q12H   spironolactone   12.5 mg Oral Daily   valACYclovir   1,000 mg Oral TID   Continuous Infusions:  Nutritional status     Body mass index is 29.71 kg/m.  Data Reviewed:   CBC: Recent Labs  Lab 05/11/23 0345 05/12/23 1816 05/13/23 0452 05/14/23 0518 05/15/23 0452 05/16/23 0526  WBC 11.7* 8.7 8.4 17.1* 18.3* 17.7*  NEUTROABS 7.3 5.9  --   --   --   --   HGB 11.9* 11.8* 11.1* 10.4* 11.0* 10.6*  HCT 37.6 38.8 36.0 35.3* 35.2* 33.5*  MCV 97.7 99.7 99.2 104.1* 98.9 100.0  PLT 264 261 247 253 291 284   Basic Metabolic Panel: Recent Labs  Lab 05/12/23 1816 05/13/23 0452 05/14/23 0518 05/15/23 0452 05/16/23 0526  NA 143 137 140 139 137  K 4.7 4.3 4.1 4.6 4.8  CL 105 100 97* 97* 94*  CO2 28 26  28  32 32  GLUCOSE 133* 209* 222* 246* 281*  BUN 22 32* 35* 36* 42*  CREATININE 1.28* 1.33* 1.24* 1.23* 1.63*  CALCIUM  9.3 8.8* 9.0 9.1 8.7*  MG  --   --  2.6* 3.1*  --   PHOS  --   --  3.1  --   --    GFR: Estimated Creatinine Clearance: 30 mL/min (A) (by C-G formula based on SCr of 1.63 mg/dL (H)). Liver Function Tests: Recent Labs  Lab 05/11/23 0345 05/12/23 1816  AST 29 31  ALT 25 28  ALKPHOS 48 49  BILITOT 0.6 0.6  PROT 5.6* 6.6  ALBUMIN 3.3* 3.7   No results for input(s): LIPASE, AMYLASE in the last 168 hours. No results for input(s):  AMMONIA in the last 168 hours. Coagulation Profile: No results for input(s): INR, PROTIME in the last 168 hours. Cardiac Enzymes: No results for input(s): CKTOTAL, CKMB, CKMBINDEX, TROPONINI in the last 168 hours. BNP (last 3 results) Recent Labs    04/23/23 1547  PROBNP 337*   HbA1C: No results for input(s): HGBA1C in the last 72 hours. CBG: No results for input(s): GLUCAP in the last 168 hours. Lipid Profile: No results for input(s): CHOL, HDL, LDLCALC, TRIG, CHOLHDL, LDLDIRECT in the last 72 hours. Thyroid  Function Tests: No results for input(s): TSH, T4TOTAL, FREET4, T3FREE, THYROIDAB in the last 72 hours. Anemia Panel: No results for input(s): VITAMINB12, FOLATE, FERRITIN, TIBC, IRON, RETICCTPCT in the last 72 hours. Sepsis Labs: Recent Labs  Lab 05/11/23 0423 05/11/23 0756 05/14/23 0516  PROCALCITON  --   --  <0.10  LATICACIDVEN 1.4 1.6  --     Recent Results (from the past 240 hours)  Resp panel by RT-PCR (RSV, Flu A&B, Covid) Urine, Catheterized     Status: None   Collection Time: 05/11/23  3:45 AM   Specimen: Urine, Catheterized; Nasal Swab  Result Value Ref Range Status   SARS Coronavirus 2 by RT PCR NEGATIVE NEGATIVE Final   Influenza A by PCR NEGATIVE NEGATIVE Final   Influenza B by PCR NEGATIVE NEGATIVE Final    Comment: (NOTE) The Xpert Xpress  SARS-CoV-2/FLU/RSV plus assay is intended as an aid in the diagnosis of influenza from Nasopharyngeal swab specimens and should not be used as a sole basis for treatment. Nasal washings and aspirates are unacceptable for Xpert Xpress SARS-CoV-2/FLU/RSV testing.  Fact Sheet for Patients: bloggercourse.com  Fact Sheet for Healthcare Providers: seriousbroker.it  This test is not yet approved or cleared by the [place]  FDA and has been authorized for detection and/or diagnosis of SARS-CoV-2 by FDA under an Emergency Use Authorization (EUA). This EUA will remain in effect (meaning this test can be used) for the duration of the COVID-19 declaration under Section 564(b)(1) of the Act, 21 U.S.C. section 360bbb-3(b)(1), unless the authorization is terminated or revoked.     Resp Syncytial Virus by PCR NEGATIVE NEGATIVE Final    Comment: (NOTE) Fact Sheet for Patients: bloggercourse.com  Fact Sheet for Healthcare Providers: seriousbroker.it  This test is not yet approved or cleared by the [place]  FDA and has been authorized for detection and/or diagnosis of SARS-CoV-2 by FDA under an Emergency Use Authorization (EUA). This EUA will remain in effect (meaning this test can be used) for the duration of the COVID-19 declaration under Section 564(b)(1) of the Act, 21 U.S.C. section 360bbb-3(b)(1), unless the authorization is terminated or revoked.  Performed at [facility], [address], [place], [place] [address]   Culture, blood (single) w Reflex to ID Panel     Status: None   Collection Time: 05/11/23  4:27 AM   Specimen: BLOOD  Result Value Ref Range Status   Specimen Description BLOOD SITE NOT SPECIFIED  Final   Special Requests   Final    BOTTLES DRAWN AEROBIC AND ANAEROBIC Blood Culture adequate volume   Culture   Final    NO GROWTH 5 DAYS Performed at [facility], [address] [place], [place] [address]    Report Status 05/16/2023 FINAL  Final         Radiology Studies: No results found.          LOS: 3 days   Time spent= 35 mins  Kim VEAR Gaw, MD [facility]  If 7PM-7AM, please contact night-coverage

## 2023-05-17 DIAGNOSIS — J441 Chronic obstructive pulmonary disease with (acute) exacerbation: Secondary | ICD-10-CM | POA: Diagnosis not present

## 2023-05-17 LAB — URINALYSIS, ROUTINE W REFLEX MICROSCOPIC
Bilirubin Urine: NEGATIVE
Glucose, UA: 50 mg/dL — AB
Hgb urine dipstick: NEGATIVE
Ketones, ur: NEGATIVE mg/dL
Leukocytes,Ua: NEGATIVE
Nitrite: NEGATIVE
Protein, ur: NEGATIVE mg/dL
Specific Gravity, Urine: 1.015 (ref 1.005–1.030)
pH: 7 (ref 5.0–8.0)

## 2023-05-17 LAB — CBC
HCT: 36.2 % (ref 36.0–46.0)
Hemoglobin: 11.4 g/dL — ABNORMAL LOW (ref 12.0–15.0)
MCH: 31.3 pg (ref 26.0–34.0)
MCHC: 31.5 g/dL (ref 30.0–36.0)
MCV: 99.5 fL (ref 80.0–100.0)
Platelets: 305 10*3/uL (ref 150–400)
RBC: 3.64 MIL/uL — ABNORMAL LOW (ref 3.87–5.11)
RDW: 16.1 % — ABNORMAL HIGH (ref 11.5–15.5)
WBC: 16.1 10*3/uL — ABNORMAL HIGH (ref 4.0–10.5)
nRBC: 1.2 % — ABNORMAL HIGH (ref 0.0–0.2)

## 2023-05-17 LAB — BASIC METABOLIC PANEL
Anion gap: 10 (ref 5–15)
BUN: 43 mg/dL — ABNORMAL HIGH (ref 8–23)
CO2: 37 mmol/L — ABNORMAL HIGH (ref 22–32)
Calcium: 9.4 mg/dL (ref 8.9–10.3)
Chloride: 93 mmol/L — ABNORMAL LOW (ref 98–111)
Creatinine, Ser: 1.48 mg/dL — ABNORMAL HIGH (ref 0.44–1.00)
GFR, Estimated: 38 mL/min — ABNORMAL LOW (ref >60)
Glucose, Bld: 157 mg/dL — ABNORMAL HIGH (ref 70–99)
Potassium: 5.2 mmol/L — ABNORMAL HIGH (ref 3.5–5.1)
Sodium: 140 mmol/L (ref 135–145)

## 2023-05-17 MED ORDER — IPRATROPIUM-ALBUTEROL 0.5-2.5 (3) MG/3ML IN SOLN
3.0000 mL | Freq: Three times a day (TID) | RESPIRATORY_TRACT | Status: DC
  Administered 2023-05-17 – 2023-05-18 (×5): 3 mL via RESPIRATORY_TRACT
  Filled 2023-05-17 (×4): qty 3

## 2023-05-17 NOTE — Plan of Care (Signed)
   Problem: Education: Goal: Knowledge of General Education information will improve Description: Including pain rating scale, medication(s)/side effects and non-pharmacologic comfort measures Outcome: Progressing   Problem: Clinical Measurements: Goal: Diagnostic test results will improve Outcome: Progressing

## 2023-05-17 NOTE — Progress Notes (Signed)
   05/17/23 2243  BiPAP/CPAP/SIPAP  BiPAP/CPAP/SIPAP Pt Type Adult  BiPAP/CPAP/SIPAP DREAMSTATIOND  Mask Type Full face mask  Mask Size Medium  IPAP 12 cmH20  EPAP 66 cmH2O  Flow Rate 3 lpm  Patient Home Equipment No  Auto Titrate No

## 2023-05-17 NOTE — TOC Initial Note (Addendum)
 Transition of Care [facility]) - Initial/Assessment Note    Patient Details  Name: Kim [name] MRN: [id number] Date of Birth: 23-Aug-1955  [facility]) CM/SW Contact:    Kim Manuella Quill, RN Phone Number: 05/17/2023, 12:56 PM  Clinical Narrative:                 TOC for d/c planning; orders received for HHPT; spoke w/ pt in room; pt says she lives at home w/ her spouse Kim [name] [phone]) she plans to return at d/c; pt says her husband will provide transportation; pt verified insurance/PCP; she denies SDOH risks; pt says she has walker, BSC, and shower chair; she also has home oxygen w/ [employer]; pt says she has a full travel tank that her husband will bring to room at d/c; pt agreed to recc [facility] services; she would like [facility]; spoke w/ Kim [name] at agency; he says agency can provide service; pt notified, and verbalized understanding; also notified Kim [name] at [employer]; contact info for [facility] and [employer] placed in follow up provider section; Dr Kim [name] notified orders for HHPT, face to face, oxygen, and amb sat note needed; awaiting orders; TOC is following.  Expected Discharge Plan: Home w Home Health Services Barriers to Discharge: Continued Medical Work up   Patient Goals and CMS Choice Patient states their goals for this hospitalization and ongoing recovery are:: home CMS Medicare.gov Compare Post Acute Care list provided to:: Patient        Expected Discharge Plan and Services   Discharge Planning Services: [facility] Program Post Acute Care Choice: Home Health Living arrangements for the past 2 months: Single Family Home                           HH Arranged: PT HH Agency: [facility] Date [facility] Agency Contacted: 05/17/23 Time HH Agency Contacted: [phone] Representative spoke with at [facility] Agency: Kim [name]  Prior Living Arrangements/Services Living arrangements for the past 2 months: Single Family Home Lives with:: Spouse Patient language and need for interpreter  reviewed:: Yes Do you feel safe going back to the place where you live?: Yes      Need for Family Participation in Patient Care: Yes (Comment) Care giver support system in place?: Yes (comment) Current home services: DME (walker, BSC, shower chair, oxygen) Criminal Activity/Legal Involvement Pertinent to Current Situation/Hospitalization: No - Comment as needed  Activities of Daily Living   ADL Screening (condition at time of admission) Independently performs ADLs?: No Does the patient have a NEW difficulty with bathing/dressing/toileting/self-feeding that is expected to last >3 days?: No Does the patient have a NEW difficulty with getting in/out of bed, walking, or climbing stairs that is expected to last >3 days?: No Does the patient have a NEW difficulty with communication that is expected to last >3 days?: No Is the patient deaf or have difficulty hearing?: No Does the patient have difficulty seeing, even when wearing glasses/contacts?: No Does the patient have difficulty concentrating, remembering, or making decisions?: No  Permission Sought/Granted Permission sought to share information with : Case Manager Permission granted to share information with : Yes, Verbal Permission Granted  Share Information with NAME: Case Manager     Permission granted to share info w Relationship: Kim [name] (spouse) [phone]     Emotional Assessment Appearance:: Appears stated age Attitude/Demeanor/Rapport: Gracious Affect (typically observed): Accepting Orientation: : Oriented to Self, Oriented to Place, Oriented to  Time, Oriented to Situation Alcohol  /  Substance Use: Not Applicable Psych Involvement: No (comment)  Admission diagnosis:  COPD exacerbation (HCC) [J44.1] COPD with acute exacerbation (HCC) [J44.1] Patient Active Problem List   Diagnosis Date Noted   Urinary retention 05/12/2023   Shingles rash 05/12/2023   Chronic pain syndrome 05/12/2023   COPD with acute  exacerbation (HCC) 10/09/2022   AKI (acute kidney injury) (HCC) 10/09/2022   COPD exacerbation (HCC) 09/11/2022   Acute on chronic respiratory failure with hypoxia and hypercapnia (HCC) 09/10/2022   Myalgia due to statin 06/05/2022   Medication reaction 02/12/2022   NSVT (nonsustained ventricular tachycardia) (HCC) 10/11/2021   Hyperkalemia 09/22/2021   SOB (shortness of breath) 09/19/2021   Chronic respiratory failure with hypoxia (HCC)    DNR (do not resuscitate) 09/17/2021   Double vision 08/26/2021   Chronic kidney disease, stage 3a (HCC) 08/26/2021   Postconcussive syndrome 08/26/2021   Elevated troponin 08/26/2021   Subarachnoid hemorrhage (HCC) 08/26/2021   Syncope and collapse 08/26/2021   NSTEMI (non-ST elevated myocardial infarction) (HCC) 11/17/2020   Osteoporosis 06/05/2020   Chronic HFrEF (heart failure with reduced ejection fraction) (HCC) 03/24/2020   Essential hypertension 03/24/2020   Vertebral fracture, osteoporotic (HCC) 03/05/2020   History of colonic polyps    Benign neoplasm of ascending colon    Neuritis 01/29/2017   Alpha-1-antitrypsin deficiency carrier 01/19/2017   Dependent edema 01/14/2017   Orthostatic hypotension    Allergic rhinitis 08/10/2015   Hyperglycemia    Coronary artery calcification seen on CAT scan 07/02/2015   Solitary pulmonary nodule 12/02/2013   Loss of weight 11/10/2013   Osteoarthritis 06/05/2009   Brachial neuritis or radiculitis 06/05/2009   Dyslipidemia 03/12/2009   NEPHROLITHIASIS 01/05/2008   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   PARESTHESIA 08/24/2007   Anxiety and depression 11/19/2006   PCP:  Kim Huxley, NP Pharmacy:   [facility] [phone] - [place], [place] - [address] AT [facility] OF [address] [place] [address] Phone: [phone] Fax: [phone]     Social Drivers of Health (SDOH) Social History: SDOH Screenings   Food Insecurity: No Food Insecurity  (05/17/2023)  Housing: Low Risk  (05/17/2023)  Transportation Needs: No Transportation Needs (05/17/2023)  Utilities: Not At Risk (05/17/2023)  Alcohol  Screen: Low Risk  (03/22/2020)  Depression (PHQ2-9): Medium Risk (09/10/2021)  Financial Resource Strain: High Risk (09/10/2021)  Physical Activity: Inactive (03/25/2021)  Social Connections: Socially Isolated (05/13/2023)  Stress: No Stress Concern Present (09/10/2021)  Tobacco Use: Medium Risk (05/12/2023)   SDOH Interventions: Food Insecurity Interventions: Intervention Not Indicated, Inpatient TOC Housing Interventions: Intervention Not Indicated, Inpatient TOC Transportation Interventions: Intervention Not Indicated, Inpatient TOC Utilities Interventions: Intervention Not Indicated, Inpatient TOC   Readmission Risk Interventions    05/17/2023   12:49 PM 09/20/2021    3:27 PM  Readmission Risk Prevention Plan  Transportation Screening Complete Complete  PCP or Specialist Appt within 3-5 Days Complete   HRI or Home Care Consult Complete   Social Work Consult for Recovery Care Planning/Counseling Complete   Palliative Care Screening Not Applicable   Medication Review Oceanographer) Complete Complete  PCP or Specialist appointment within 3-5 days of discharge  Complete  HRI or Home Care Consult  Complete  SW Recovery Care/Counseling Consult  Complete  Palliative Care Screening  Not Applicable  Skilled Nursing Facility  Not Applicable

## 2023-05-17 NOTE — Progress Notes (Signed)
 PROGRESS NOTE    Kim [name]  FMW:[id number] DOB: 01-Oct-1955 DOA: 05/12/2023 PCP: Merna Huxley, NP   Brief Narrative:  68 year old with history of CHF EF 35%, nonobstructive CAD, COPD on chronic prednisone , chronic hypoxia 3 L nasal cannula CKD stage IIIa, hyperlipidemia, chronic pain, depression/anxiety admitted for shortness of breath.  Patient was found to be in COPD exacerbation started on steroids and bronchodilators.  Foley catheter also placed in the ED due to new urinary retention.    Assessment & Plan:  Principal Problem:   COPD with acute exacerbation (HCC) Active Problems:   Chronic respiratory failure with hypoxia (HCC)   Urinary retention   Shingles rash   Chronic HFrEF (heart failure with reduced ejection fraction) (HCC)   Chronic kidney disease, stage 3a (HCC)   Dyslipidemia   Anxiety and depression   Chronic pain syndrome   COPD with acute exacerbation Chronic respiratory failure with hypoxia: -Breathing a little better today.  Still with coarse breath sounds bilateral bases.  But overall improving.   -Continuing with scheduled bronchodilators  - continue supplemental oxygen here and wean as appropriate.  Follows outpatient pulmonary, Dr. Jude.   -Switch to p.o. prednisone  from methylprednisolone  yesterday.  She continues to have some shakiness secondary to long-term steroid use.  Also with typical moon facies/facial swelling typical of chronic steroid use.   - Bronchodilators scheduled and as needed.  I-S/flutter valve.  BNP overall unremarkable.  Finish 3 days of azithromycin  -Of note she has been on 10 mg of prednisone  for some time.  Recommend prolonged taper back to 10 mg dose at discharge.   Urinary retention: New issue, EDP placed Foley on 2/3.  UA is negative.  Discontinue Foley catheter, perform voiding trial later today. She has outpatient appointment with urology on upcoming Tuesday. Repeat UA today as relatively new onset dysuria and bladder  pressure for past day.  This is different than symptoms she was having previously.   Rash of right buttocks: -Rash noted right buttocks.  Initially diagnosed with shingles but rash distribution no longer appears to be consistent with shingles.  Denies any actual pain.  This is also slowly improving -She and her husband are both convinced that her current symptoms all resulted from administration of Valtrex . -Because this less likely shingles(nontender rash) will stop Valtrex  as of 2/8.   Chronic HFrEF: Euvolemic on admission.  Last EF 35-40% by TTE on 09/11/2022. Continue Lasix  and Aldactone .\ BNP unremarkable here and lungs with coarse breath sounds making COPD exacerbation much more likely cause of hospitalization.   Coronary artery disease: Chest pain-free Stable, denies chest pain.  Continue aspirin , rosuvastatin , Zetia .   CKD stage IIIa: Cr 1.3 (baseline) - SCr has been slightly elevated this admission but is now downtrending.   Hyperlipidemia: Continue rosuvastatin  and Zetia .   Depression/anxiety: Continue paroxetine  and Xanax  1 mg 3 times daily as needed.   Chronic pain syndrome: Continue home Norco as needed.  Hyperkalemia: -Potassium 5.2 today.  She is evidently beginning daily potassium.  Stop today. -Repeat check tomorrow to ensure not continuing to rise..  Does have CKD as above.  Leukocytosis: -Secondary to ongoing steroid use. -No signs of infection.     DVT prophylaxis: She has been refusing Lovenox  past several days.    Code Status: Limited: Do not attempt resuscitation (DNR) -DNR-LIMITED -Do Not Intubate/DNI  Family Communication: Husband has been at bedside almost every visit. Continue hospital stay for significant abnormal breath sounds Disposition: Likely home tomorrow 2/10 with home health  PT     Subjective: Feels better today than she has since admission.  Still tremulous but this is also better since switch from twice daily Methylpred to prednisone .   Breathing improved.  She is back on her home oxygen of 3 L at rest although she has increased oxygen needs when she tries to get up or move around at all.     Examination:   General exam: Appears calm and comfortable.  Swollen face/moon facies typical of long-term steroid use. Respiratory system: Bilateral diffuse coarse breath sounds throughout.  Better than have been Cardiovascular system: Distant heart sounds but normal. Gastrointestinal system: Abdomen is nondistended, soft.  Some mild lower quadrant tenderness to palpation reproducing sensation of needing to urinate. Central nervous system: Alert and oriented. No focal neurological deficits. Extremities: Symmetric 5 x 5 power. Skin: No rashes, lesions or ulcers Psychiatry: Judgement and insight appear normal. Mood & affect appropriate.         Diet Orders (From admission, onward)     Start     Ordered   05/12/23 2048  Diet Heart Room service appropriate? Yes; Fluid consistency: Thin  Diet effective now       Question Answer Comment  Room service appropriate? Yes   Fluid consistency: Thin      05/12/23 2047            Objective: Vitals:   05/16/23 2259 05/17/23 0606 05/17/23 0844 05/17/23 1437  BP: 127/79 131/71  108/83  Pulse: 95 77  95  Resp: 18 20  16   Temp: 97.7 F (36.5 C) (!) 97.5 F (36.4 C)  98.4 F (36.9 C)  TempSrc: Oral Oral  Oral  SpO2: 98% 99% 92% 92%  Weight:      Height:        Intake/Output Summary (Last 24 hours) at 05/17/2023 1436 Last data filed at 05/17/2023 0944 Gross per 24 hour  Intake 540 ml  Output 1100 ml  Net -560 ml   Filed Weights   05/14/23 1411  Weight: 72.5 kg    Scheduled Meds:  arformoterol   15 mcg Nebulization BID   aspirin  EC  81 mg Oral Q M,W,F   bisacodyl   10 mg Oral BID   budesonide  (PULMICORT ) nebulizer solution  0.25 mg Nebulization BID   Chlorhexidine  Gluconate Cloth  6 each Topical Daily   enoxaparin  (LOVENOX ) injection  40 mg Subcutaneous Q24H    Ensifentrine   3 mg Nebulization BID   ezetimibe   10 mg Oral Daily   furosemide   80 mg Oral QHS   guaiFENesin   1,200 mg Oral Q12H   hydrocortisone   1 Application Rectal BID   ipratropium-albuterol   3 mL Nebulization TID   PARoxetine   40 mg Oral Daily   predniSONE   60 mg Oral QAC breakfast   rosuvastatin   20 mg Oral Daily   sodium chloride  flush  3 mL Intravenous Q12H   spironolactone   12.5 mg Oral Daily   Continuous Infusions:  Nutritional status     Body mass index is 29.71 kg/m.  Data Reviewed:   CBC: Recent Labs  Lab 05/11/23 0345 05/12/23 1816 05/13/23 0452 05/14/23 0518 05/15/23 0452 05/16/23 0526 05/17/23 0518  WBC 11.7* 8.7 8.4 17.1* 18.3* 17.7* 16.1*  NEUTROABS 7.3 5.9  --   --   --   --   --   HGB 11.9* 11.8* 11.1* 10.4* 11.0* 10.6* 11.4*  HCT 37.6 38.8 36.0 35.3* 35.2* 33.5* 36.2  MCV 97.7 99.7 99.2 104.1* 98.9 100.0 99.5  PLT 264 261 247 253 291 284 305   Basic Metabolic Panel: Recent Labs  Lab 05/13/23 0452 05/14/23 0518 05/15/23 0452 05/16/23 0526 05/17/23 0518  NA 137 140 139 137 140  K 4.3 4.1 4.6 4.8 5.2*  CL 100 97* 97* 94* 93*  CO2 26 28 32 32 37*  GLUCOSE 209* 222* 246* 281* 157*  BUN 32* 35* 36* 42* 43*  CREATININE 1.33* 1.24* 1.23* 1.63* 1.48*  CALCIUM  8.8* 9.0 9.1 8.7* 9.4  MG  --  2.6* 3.1*  --   --   PHOS  --  3.1  --   --   --    GFR: Estimated Creatinine Clearance: 33.1 mL/min (A) (by C-G formula based on SCr of 1.48 mg/dL (H)). Liver Function Tests: Recent Labs  Lab 05/11/23 0345 05/12/23 1816  AST 29 31  ALT 25 28  ALKPHOS 48 49  BILITOT 0.6 0.6  PROT 5.6* 6.6  ALBUMIN 3.3* 3.7   No results for input(s): LIPASE, AMYLASE in the last 168 hours. No results for input(s): AMMONIA in the last 168 hours. Coagulation Profile: No results for input(s): INR, PROTIME in the last 168 hours. Cardiac Enzymes: No results for input(s): CKTOTAL, CKMB, CKMBINDEX, TROPONINI in the last 168 hours. BNP (last 3  results) Recent Labs    04/23/23 1547  PROBNP 337*   HbA1C: No results for input(s): HGBA1C in the last 72 hours. CBG: No results for input(s): GLUCAP in the last 168 hours. Lipid Profile: No results for input(s): CHOL, HDL, LDLCALC, TRIG, CHOLHDL, LDLDIRECT in the last 72 hours. Thyroid  Function Tests: No results for input(s): TSH, T4TOTAL, FREET4, T3FREE, THYROIDAB in the last 72 hours. Anemia Panel: No results for input(s): VITAMINB12, FOLATE, FERRITIN, TIBC, IRON, RETICCTPCT in the last 72 hours. Sepsis Labs: Recent Labs  Lab 05/11/23 0423 05/11/23 0756 05/14/23 0516  PROCALCITON  --   --  <0.10  LATICACIDVEN 1.4 1.6  --     Recent Results (from the past 240 hours)  Resp panel by RT-PCR (RSV, Flu A&B, Covid) Urine, Catheterized     Status: None   Collection Time: 05/11/23  3:45 AM   Specimen: Urine, Catheterized; Nasal Swab  Result Value Ref Range Status   SARS Coronavirus 2 by RT PCR NEGATIVE NEGATIVE Final   Influenza A by PCR NEGATIVE NEGATIVE Final   Influenza B by PCR NEGATIVE NEGATIVE Final    Comment: (NOTE) The Xpert Xpress SARS-CoV-2/FLU/RSV plus assay is intended as an aid in the diagnosis of influenza from Nasopharyngeal swab specimens and should not be used as a sole basis for treatment. Nasal washings and aspirates are unacceptable for Xpert Xpress SARS-CoV-2/FLU/RSV testing.  Fact Sheet for Patients: bloggercourse.com  Fact Sheet for Healthcare Providers: seriousbroker.it  This test is not yet approved or cleared by the [place]  FDA and has been authorized for detection and/or diagnosis of SARS-CoV-2 by FDA under an Emergency Use Authorization (EUA). This EUA will remain in effect (meaning this test can be used) for the duration of the COVID-19 declaration under Section 564(b)(1) of the Act, 21 U.S.C. section 360bbb-3(b)(1), unless the authorization is  terminated or revoked.     Resp Syncytial Virus by PCR NEGATIVE NEGATIVE Final    Comment: (NOTE) Fact Sheet for Patients: bloggercourse.com  Fact Sheet for Healthcare Providers: seriousbroker.it  This test is not yet approved or cleared by the [place]  FDA and has been authorized for detection and/or diagnosis of SARS-CoV-2 by FDA under an Emergency Use Authorization (  EUA). This EUA will remain in effect (meaning this test can be used) for the duration of the COVID-19 declaration under Section 564(b)(1) of the Act, 21 U.S.C. section 360bbb-3(b)(1), unless the authorization is terminated or revoked.  Performed at [facility], [address] [place], [place] [address]   Culture, blood (single) w Reflex to ID Panel     Status: None   Collection Time: 05/11/23  4:27 AM   Specimen: BLOOD  Result Value Ref Range Status   Specimen Description BLOOD SITE NOT SPECIFIED  Final   Special Requests   Final    BOTTLES DRAWN AEROBIC AND ANAEROBIC Blood Culture adequate volume   Culture   Final    NO GROWTH 5 DAYS Performed at [facility], [address], [place], [place] [address]    Report Status 05/16/2023 FINAL  Final         Radiology Studies: No results found.     LOS: 4 days   Time spent= 35 mins  Reyes VEAR Gaw, MD [facility]  If 7PM-7AM, please contact night-coverage

## 2023-05-18 DIAGNOSIS — J441 Chronic obstructive pulmonary disease with (acute) exacerbation: Secondary | ICD-10-CM | POA: Diagnosis not present

## 2023-05-18 LAB — CBC
HCT: 36.3 % (ref 36.0–46.0)
Hemoglobin: 11.4 g/dL — ABNORMAL LOW (ref 12.0–15.0)
MCH: 30.8 pg (ref 26.0–34.0)
MCHC: 31.4 g/dL (ref 30.0–36.0)
MCV: 98.1 fL (ref 80.0–100.0)
Platelets: 308 10*3/uL (ref 150–400)
RBC: 3.7 MIL/uL — ABNORMAL LOW (ref 3.87–5.11)
RDW: 16.4 % — ABNORMAL HIGH (ref 11.5–15.5)
WBC: 14.5 10*3/uL — ABNORMAL HIGH (ref 4.0–10.5)
nRBC: 1.7 % — ABNORMAL HIGH (ref 0.0–0.2)

## 2023-05-18 LAB — BASIC METABOLIC PANEL
Anion gap: 11 (ref 5–15)
BUN: 50 mg/dL — ABNORMAL HIGH (ref 8–23)
CO2: 34 mmol/L — ABNORMAL HIGH (ref 22–32)
Calcium: 9.4 mg/dL (ref 8.9–10.3)
Chloride: 91 mmol/L — ABNORMAL LOW (ref 98–111)
Creatinine, Ser: 1.35 mg/dL — ABNORMAL HIGH (ref 0.44–1.00)
GFR, Estimated: 43 mL/min — ABNORMAL LOW (ref >60)
Glucose, Bld: 157 mg/dL — ABNORMAL HIGH (ref 70–99)
Potassium: 4.7 mmol/L (ref 3.5–5.1)
Sodium: 136 mmol/L (ref 135–145)

## 2023-05-18 MED ORDER — PREDNISONE 10 MG PO TABS: ORAL_TABLET | ORAL | 3 refills | Status: DC

## 2023-05-18 NOTE — Plan of Care (Signed)
   Problem: Education: Goal: Knowledge of General Education information will improve Description: Including pain rating scale, medication(s)/side effects and non-pharmacologic comfort measures Outcome: Progressing   Problem: Coping: Goal: Level of anxiety will decrease Outcome: Progressing

## 2023-05-18 NOTE — Discharge Summary (Signed)
 Kim [name]:[id number] DOB: 1956-01-15 DOA: 05/12/2023  PCP: Alto Atta, NP  Admit date: 05/12/2023 Discharge date: 05/18/2023  Time spent: 35 minutes  Recommendations for Outpatient Follow-up:  Pcp f/u 1 week, check bmp then (home potassium held at discharge). Pulmonology f/u Urology f/u 1-2 weeks     Discharge Diagnoses:  Principal Problem:   COPD with acute exacerbation (HCC) Active Problems:   COPD exacerbation (HCC)   Chronic respiratory failure with hypoxia (HCC)   Urinary retention   Shingles rash   Chronic HFrEF (heart failure with reduced ejection fraction) (HCC)   Chronic kidney disease, stage 3a (HCC)   Dyslipidemia   Anxiety and depression   Chronic pain syndrome   Discharge Condition: stable  Diet recommendation: heart healthy  Filed Weights   05/14/23 1411  Weight: 72.5 kg    History of present illness:  From admission h and p Kim [name] is a 68 y.o. female with medical history significant for chronic HFrEF (EF 35-40%), nonobstructive CAD, COPD on chronic prednisone , chronic respiratory failure with hypoxia on 2-3 L O2 via [place], CKD stage IIIa, HLD, chronic pain, depression/anxiety who presented to the ED for evaluation of shortness of breath.   Patient initially presented to the ED via EMS from home yesterday 05/11/23 for shortness of breath and complaint of urinary retention and suprapubic discomfort.  Bladder scan confirmed urinary retention.  Foley catheter was placed with 650 mL of urine out.  She also reported new rash on her right buttocks that was consistent with shingles rash and she was given a prescription for Valtrex .  Patient was discharged with Foley catheter in place and follow-up with urology.   Patient returned to the ED today with complaint of continued shortness of breath.  She reports new cough over the last 3 days with new green sputum production.  Patient states that she usually wears 2.5 L O2 via [place] while at rest and increases  it to 3 L with activity.  Shortness of breath was not improved with her home inhalers therefore she came back to the ED for further evaluation.   Patient has been on chronic prednisone  10 mg daily, she says that this dose was reduced to 5 mg daily just yesterday per her pulmonologist.   She denies chest pain, nausea, vomiting, abdominal pain.  Hospital Course:  Patient presents with shortness of breath, found to have acute exacerbation of copd, treated with corticosteroids. Breathing has not returned to baseline and her oxygen is stable on home 3 liters. Patient also found to have urinary retention on recent ED visit where foley was placed. The foley was discontinued during this hospitalization, but on day of discharge patient reports several days of suprapubic pain and difficulty urinating - bladder scan showed patient continues to retain (700 ml in bladder), so Foley catheter was placed, and patient will need outpatient urology f/u with the urology provider she was referred to at the time of the initial diagnosis of retention. Recently started on valtrex  for a rash on her buttock but per other hospital provider rash does not appear to be shingles and is improving, so valtrex  was discontinued. Other chronic medical conditions stable. Home health and wheelchair (for the past year or so patient has been mostly bedbound dependent on wheelchair for getting around) ordered at discharge. Had mild hyperkalemia this admission probably iatrogenic from home potassium, this was held, will need attention to this at pcp f/u.  Procedures: none   Consultations: none  Discharge Exam:  Vitals:   05/17/23 2238 05/18/23 0809  BP:    Pulse:    Resp:    Temp:    SpO2: 96% 98%    General: NAD Cardiovascular: RRR Respiratory: clear, normal WOB  Discharge Instructions   Discharge Instructions     Diet - low sodium heart healthy   Complete by: As directed    Increase activity slowly   Complete by: As  directed       Allergies as of 05/18/2023       Reactions   Tape Other (See Comments)   Skin tears easily, peels skin off   Daliresp  [roflumilast ] Other (See Comments)   Skin peeling   Cozaar  [losartan ] Other (See Comments)   Possible cause of severe hypotension   Dupixent  [dupilumab ] Diarrhea, Swelling, Other (See Comments), Cough   Facial swelling Headache   Latex Other (See Comments)   Peels skin off   Penicillins Hives   Statins Other (See Comments)   Myalgia   Torsemide  Other (See Comments)   Severe stomach cramps and all over   Zestril  [lisinopril ] Cough   Omnicef  [cefdinir ] Rash        Medication List     STOP taking these medications    potassium chloride  10 MEQ tablet Commonly known as: KLOR-CON    potassium chloride  SA 20 MEQ tablet Commonly known as: KLOR-CON  M   valACYclovir  1000 MG tablet Commonly known as: VALTREX        TAKE these medications    albuterol  108 (90 Base) MCG/ACT inhaler Commonly known as: Ventolin  HFA INHALE 2 PUFFS INTO THE LUNGS EVERY 4 TO 6 HOURS AS NEEDED FOR SHORTNESS OF BREATH OR WHEEZING What changed:  how much to take how to take this when to take this reasons to take this additional instructions   ALPRAZolam  1 MG tablet Commonly known as: XANAX  Take 1 mg by mouth 3 (three) times daily as needed for anxiety.   aspirin  EC 81 MG tablet Take 1 tablet (81 mg total) by mouth daily. What changed: when to take this   butalbital -acetaminophen -caffeine  50-325-40 MG tablet Commonly known as: FIORICET  Take 1 tablet by mouth every 4 (four) hours as needed for headache. TAKE 1 TABLET BY MOUTH EVERY 4 HOURS AS NEEDED FOR HEADACHETAKE 1 TABLET BY MOUTH EVERY 4 HOURS AS NEEDED FOR HEADACHE What changed: additional instructions   cetirizine 10 MG tablet Commonly known as: ZYRTEC Take 10 mg by mouth daily.   ezetimibe  10 MG tablet Commonly known as: ZETIA  TAKE 1 TABLET(10 MG) BY MOUTH DAILY What changed:  how much to  take how to take this when to take this additional instructions   furosemide  40 MG tablet Commonly known as: LASIX  Take 2 tablets (80 mg total) by mouth daily. What changed: when to take this   HYDROcodone -acetaminophen  7.5-325 MG tablet Commonly known as: Norco Take 1 tablet by mouth every 6 (six) hours as needed for moderate pain (pain score 4-6).   hydrocortisone  2.5 % rectal cream Commonly known as: ANUSOL -HC Place 1 Application rectally 2 (two) times daily.   ipratropium-albuterol  0.5-2.5 (3) MG/3ML Soln Commonly known as: DUONEB Take 3 mLs by nebulization every 4 (four) hours as needed. What changed:  when to take this reasons to take this   magnesium  oxide 400 MG tablet Commonly known as: MAG-OX Take 400 mg by mouth daily.   Mucinex  Maximum Strength 1200 MG Tb12 Generic drug: Guaifenesin  Take 1,200 mg by mouth every 12 (twelve) hours.   nitroGLYCERIN  0.4 MG  SL tablet Commonly known as: NITROSTAT  Place 1 tablet (0.4 mg total) under the tongue every 5 (five) minutes as needed for chest pain.   nystatin  100000 UNIT/ML suspension Commonly known as: MYCOSTATIN  Take 5 mLs (500,000 Units total) by mouth 4 (four) times daily. What changed:  when to take this reasons to take this   Ohtuvayre  3 MG/2.5ML Susp Generic drug: Ensifentrine  Take 3 mg by nebulization in the morning and at bedtime.   OXYGEN Inhale 2.5-3.5 L/min into the lungs continuous.   PARoxetine  40 MG tablet Commonly known as: PAXIL  Take 0.5 tablets (20 mg total) by mouth in the morning. TAKE 1 TABLET(40 MG) BY MOUTH EVERY MORNING What changed:  how much to take additional instructions   predniSONE  10 MG tablet Commonly known as: DELTASONE  4 tabs daily for 3 days then 3 tabs daily for 3 days then 2 tabs daily for 3 days then 1 tab daily What changed:  how much to take how to take this when to take this additional instructions   rosuvastatin  20 MG tablet Commonly known as: CRESTOR  Take 1  tablet (20 mg total) by mouth daily.   Senokot 8.6 MG tablet Generic drug: senna Take 1 tablet by mouth 2 (two) times daily as needed for constipation.   spironolactone  25 MG tablet Commonly known as: ALDACTONE  TAKE 1/2 TABLET(12.5 MG) BY MOUTH DAILY What changed: See the new instructions.   Symbicort  160-4.5 MCG/ACT inhaler Generic drug: budesonide -formoterol  Inhale 2 puffs into the lungs 2 (two) times daily.   Vitamin B-12 5000 MCG Subl Place 5,000 mcg under the tongue daily.   Vitamin D3 125 MCG (5000 UT) Caps Take 5,000 Units by mouth daily.               Durable Medical Equipment  (From admission, onward)           Start     Ordered   05/18/23 1216  For home use only DME standard manual wheelchair with seat cushion  Once       Comments: Patient suffers from copd and obesity which impairs their ability to perform daily activities like bathing, dressing, feeding, grooming, and toileting in the home.  A cane, crutch, or walker will not resolve issue with performing activities of daily living. A wheelchair will allow patient to safely perform daily activities. Patient can safely propel the wheelchair in the home or has a caregiver who can provide assistance. Length of need Lifetime. Accessories: elevating leg rests (ELRs), wheel locks, extensions and anti-tippers.   05/18/23 1215           Allergies  Allergen Reactions   Tape Other (See Comments)    Skin tears easily, peels skin off   Daliresp  [Roflumilast ] Other (See Comments)    Skin peeling   Cozaar  [Losartan ] Other (See Comments)    Possible cause of severe hypotension   Dupixent  [Dupilumab ] Diarrhea, Swelling, Other (See Comments) and Cough    Facial swelling Headache   Latex Other (See Comments)    Peels skin off   Penicillins Hives   Statins Other (See Comments)    Myalgia    Torsemide  Other (See Comments)    Severe stomach cramps and all over   Zestril  [Lisinopril ] Cough   Omnicef  [Cefdinir ]  Rash    Follow-up Information     [facility] Follow up.   Specialty: Home Health Services Contact information: [address] [place] [address] [phone]  [facility] Follow up.   Contact information: [address] [address] [phone]         [facility] Follow up.   Contact information: [address] [place], [place] [address]  Phone: [phone]        Alto Atta, NP Follow up.   Specialty: Family Medicine Contact information: [address] [place] [address] [phone]         Lind Repine, MD Follow up.   Specialty: Pulmonary Disease Contact information: [address] [place] [address] [phone]         Urology Follow up.   Why: 2 weeks                 The results of significant diagnostics from this hospitalization (including imaging, microbiology, ancillary and laboratory) are listed below for reference.    Significant Diagnostic Studies: DG Chest Port 1 View Result Date: 05/12/2023 CLINICAL DATA:  Shortness of breath over the last 2 days. EXAM: PORTABLE CHEST 1 VIEW COMPARISON:  05/11/2023 FINDINGS: Artifact overlies the chest. Heart size is normal. Chronic aortic atherosclerotic calcification is noted. The lungs are clear. No infiltrate, collapse or effusion. Mild chronic interstitial markings noted in the lower lungs. IMPRESSION: No active disease. Aortic atherosclerosis. Mild chronic interstitial markings in the lower lungs. Electronically Signed   By: Bettylou Brunner M.D.   On: 05/12/2023 17:22   DG Chest Port 1 View Result Date: 05/11/2023 CLINICAL DATA:  Dyspnea EXAM: PORTABLE CHEST 1 VIEW COMPARISON:  None Available. FINDINGS: The heart size and mediastinal contours are within normal limits. Both lungs are clear. The visualized skeletal structures are unremarkable. IMPRESSION: No active disease. Electronically Signed    By: Worthy Heads M.D.   On: 05/11/2023 03:25    Microbiology: Recent Results (from the past 240 hours)  Resp panel by RT-PCR (RSV, Flu A&B, Covid) Urine, Catheterized     Status: None   Collection Time: 05/11/23  3:45 AM   Specimen: Urine, Catheterized; Nasal Swab  Result Value Ref Range Status   SARS Coronavirus 2 by RT PCR NEGATIVE NEGATIVE Final   Influenza A by PCR NEGATIVE NEGATIVE Final   Influenza B by PCR NEGATIVE NEGATIVE Final    Comment: (NOTE) The Xpert Xpress SARS-CoV-2/FLU/RSV plus assay is intended as an aid in the diagnosis of influenza from Nasopharyngeal swab specimens and should not be used as a sole basis for treatment. Nasal washings and aspirates are unacceptable for Xpert Xpress SARS-CoV-2/FLU/RSV testing.  Fact Sheet for Patients: BloggerCourse.com  Fact Sheet for Healthcare Providers: SeriousBroker.it  This test is not yet approved or cleared by the [place]  FDA and has been authorized for detection and/or diagnosis of SARS-CoV-2 by FDA under an Emergency Use Authorization (EUA). This EUA will remain in effect (meaning this test can be used) for the duration of the COVID-19 declaration under Section 564(b)(1) of the Act, 21 U.S.C. section 360bbb-3(b)(1), unless the authorization is terminated or revoked.     Resp Syncytial Virus by PCR NEGATIVE NEGATIVE Final    Comment: (NOTE) Fact Sheet for Patients: BloggerCourse.com  Fact Sheet for Healthcare Providers: SeriousBroker.it  This test is not yet approved or cleared by the [place]  FDA and has been authorized for detection and/or diagnosis of SARS-CoV-2 by FDA under an Emergency Use Authorization (EUA). This EUA will remain in effect (meaning this test can be used) for the duration of the COVID-19 declaration under Section 564(b)(1)  of the Act, 21 U.S.C. section 360bbb-3(b)(1), unless  the authorization is terminated or revoked.  Performed at [facility], [address], [place], [place] [address]   Culture, blood (single) w Reflex to ID Panel     Status: None   Collection Time: 05/11/23  4:27 AM   Specimen: BLOOD  Result Value Ref Range Status   Specimen Description BLOOD SITE NOT SPECIFIED  Final   Special Requests   Final    BOTTLES DRAWN AEROBIC AND ANAEROBIC Blood Culture adequate volume   Culture   Final    NO GROWTH 5 DAYS Performed at [facility], [address] [place], [place] [address]    Report Status 05/16/2023 FINAL  Final     Labs: Basic Metabolic Panel: Recent Labs  Lab 05/14/23 0518 05/15/23 0452 05/16/23 0526 05/17/23 0518 05/18/23 0440  NA 140 139 137 140 136  K 4.1 4.6 4.8 5.2* 4.7  CL 97* 97* 94* 93* 91*  CO2 28 32 32 37* 34*  GLUCOSE 222* 246* 281* 157* 157*  BUN 35* 36* 42* 43* 50*  CREATININE 1.24* 1.23* 1.63* 1.48* 1.35*  CALCIUM  9.0 9.1 8.7* 9.4 9.4  MG 2.6* 3.1*  --   --   --   PHOS 3.1  --   --   --   --    Liver Function Tests: Recent Labs  Lab 05/12/23 1816  AST 31  ALT 28  ALKPHOS 49  BILITOT 0.6  PROT 6.6  ALBUMIN 3.7   No results for input(s): "LIPASE", "AMYLASE" in the last 168 hours. No results for input(s): "AMMONIA" in the last 168 hours. CBC: Recent Labs  Lab 05/12/23 1816 05/13/23 0452 05/14/23 0518 05/15/23 0452 05/16/23 0526 05/17/23 0518 05/18/23 0440  WBC 8.7   < > 17.1* 18.3* 17.7* 16.1* 14.5*  NEUTROABS 5.9  --   --   --   --   --   --   HGB 11.8*   < > 10.4* 11.0* 10.6* 11.4* 11.4*  HCT 38.8   < > 35.3* 35.2* 33.5* 36.2 36.3  MCV 99.7   < > 104.1* 98.9 100.0 99.5 98.1  PLT 261   < > 253 291 284 305 308   < > = values in this interval not displayed.   Cardiac Enzymes: No results for input(s): "CKTOTAL", "CKMB", "CKMBINDEX", "TROPONINI" in the last 168 hours. BNP: BNP (last 3 results) Recent Labs    10/13/22 0807 05/11/23 0345 05/14/23 0518  BNP 217.8* 188.6*  138.4*    ProBNP (last 3 results) Recent Labs    04/23/23 1547  PROBNP 337*    CBG: No results for input(s): "GLUCAP" in the last 168 hours.     Signed:  Raymonde Calico MD.  [facility] 05/18/2023, 12:33 PM

## 2023-05-18 NOTE — TOC Progression Note (Deleted)
 Transition of Care [facility]) - Progression Note    Patient Details  Name: Kim [name] MRN: [id number] Date of Birth: April 06, 1956  Transition of Care [facility]) CM/SW Contact  Bari Leys, RN Phone Number: 05/18/2023, 12:20 PM  Clinical Narrative:  [facility] PT/OT request sent to [facility], [facility], [facility] and [facility], all unable to accept.       Expected Discharge Plan: Home w Home Health Services Barriers to Discharge: Continued Medical Work up  Expected Discharge Plan and Services   Discharge Planning Services: [facility] Program Post Acute Care Choice: Home Health Living arrangements for the past 2 months: Single Family Home                 DME Arranged: Oxygen DME Agency: [facility] Date DME Agency Contacted: 05/17/23 Time DME Agency Contacted: [phone] Representative spoke with at DME Agency: Harriet Limber HH Arranged: PT HH Agency: [facility] Date [facility] Agency Contacted: 05/17/23 Time HH Agency Contacted: [phone] Representative spoke with at [facility] Agency: Randel Buss   Social Determinants of Health (SDOH) Interventions SDOH Screenings   Food Insecurity: No Food Insecurity (05/17/2023)  Housing: Low Risk  (05/17/2023)  Transportation Needs: No Transportation Needs (05/17/2023)  Utilities: Not At Risk (05/17/2023)  Alcohol  Screen: Low Risk  (03/22/2020)  Depression (PHQ2-9): Medium Risk (09/10/2021)  Financial Resource Strain: High Risk (09/10/2021)  Physical Activity: Inactive (03/25/2021)  Social Connections: Socially Isolated (05/13/2023)  Stress: No Stress Concern Present (09/10/2021)  Tobacco Use: Medium Risk (05/12/2023)    Readmission Risk Interventions    05/17/2023   12:49 PM 09/20/2021    3:27 PM  Readmission Risk Prevention Plan  Transportation Screening Complete Complete  PCP or Specialist Appt within 3-5 Days Complete   HRI or Home Care Consult Complete   Social Work Consult for Recovery Care Planning/Counseling Complete   Palliative Care Screening Not Applicable   Medication  Review Oceanographer) Complete Complete  PCP or Specialist appointment within 3-5 days of discharge  Complete  HRI or Home Care Consult  Complete  SW Recovery Care/Counseling Consult  Complete  Palliative Care Screening  Not Applicable  Skilled Nursing Facility  Not Applicable

## 2023-05-18 NOTE — TOC Progression Note (Signed)
 Transition of Care [facility]) - Progression Note    Patient Details  Name: Kim [name] MRN: [id number] Date of Birth: 09/05/1955  Transition of Care [facility]) CM/SW Contact  Bari Leys, RN Phone Number: 05/18/2023, 12:18 PM  Clinical Narrative:   MD order entered for [facility] PT/OT per PT recommendation, wheelchair, [employer]- rep-Jermaine ,added to AVS.     Expected Discharge Plan: Home w Home Health Services Barriers to Discharge: Continued Medical Work up  Expected Discharge Plan and Services   Discharge Planning Services: [facility] Program Post Acute Care Choice: Home Health Living arrangements for the past 2 months: Single Family Home                 DME Arranged: Oxygen DME Agency: [facility] Date DME Agency Contacted: 05/17/23 Time DME Agency Contacted: [phone] Representative spoke with at DME Agency: Harriet Limber HH Arranged: PT HH Agency: [facility] Date [facility] Agency Contacted: 05/17/23 Time HH Agency Contacted: 1255 Representative spoke with at [facility] Agency: Randel Buss   Social Determinants of Health (SDOH) Interventions SDOH Screenings   Food Insecurity: No Food Insecurity (05/17/2023)  Housing: Low Risk  (05/17/2023)  Transportation Needs: No Transportation Needs (05/17/2023)  Utilities: Not At Risk (05/17/2023)  Alcohol  Screen: Low Risk  (03/22/2020)  Depression (PHQ2-9): Medium Risk (09/10/2021)  Financial Resource Strain: High Risk (09/10/2021)  Physical Activity: Inactive (03/25/2021)  Social Connections: Socially Isolated (05/13/2023)  Stress: No Stress Concern Present (09/10/2021)  Tobacco Use: Medium Risk (05/12/2023)    Readmission Risk Interventions    05/17/2023   12:49 PM 09/20/2021    3:27 PM  Readmission Risk Prevention Plan  Transportation Screening Complete Complete  PCP or Specialist Appt within 3-5 Days Complete   HRI or Home Care Consult Complete   Social Work Consult for Recovery Care Planning/Counseling Complete   Palliative Care Screening Not Applicable    Medication Review Oceanographer) Complete Complete  PCP or Specialist appointment within 3-5 days of discharge  Complete  HRI or Home Care Consult  Complete  SW Recovery Care/Counseling Consult  Complete  Palliative Care Screening  Not Applicable  Skilled Nursing Facility  Not Applicable

## 2023-05-18 NOTE — Plan of Care (Signed)
   Problem: Clinical Measurements: Goal: Will remain free from infection Outcome: Progressing Goal: Diagnostic test results will improve Outcome: Progressing

## 2023-05-18 NOTE — Progress Notes (Signed)
 Discharge instructions given to patient and all questions were answered.

## 2023-05-20 ENCOUNTER — Telehealth: Payer: Self-pay

## 2023-05-20 NOTE — Transitions of Care (Post Inpatient/ED Visit) (Signed)
   05/20/2023  Name: Kim [name] MRN: [id number] DOB: 1955/06/20  Today's TOC FU Call Status: Today's TOC FU Call Status:: Successful TOC FU Call Completed TOC FU Call Complete Date: 05/20/23 Patient's Name and Date of Birth confirmed.  Transition Care Management Follow-up Telephone Call Date of Discharge: 05/18/23 Discharge Facility: [facility]) Type of Discharge: Inpatient Admission Primary Inpatient Discharge Diagnosis:: COPD with acute exacerbation  Placed call to patient who refused to confirm dob. Explained reason for call and patient declined to participate in [facility] assessment and states she has palliative care coming. Offered to provide contact information for future reference and patient declined.    Hilbert Odor RN, CCM [place]  VBCI-Population Health RN Care Manager [phone]

## 2023-05-27 ENCOUNTER — Inpatient Hospital Stay: Payer: Medicare Other | Admitting: Adult Health

## 2023-05-29 ENCOUNTER — Ambulatory Visit (INDEPENDENT_AMBULATORY_CARE_PROVIDER_SITE_OTHER): Payer: Medicare Other | Admitting: Adult Health

## 2023-05-29 VITALS — BP 140/80 | HR 95 | Temp 98.3°F | Ht 61.5 in | Wt 157.0 lb

## 2023-05-29 DIAGNOSIS — R21 Rash and other nonspecific skin eruption: Secondary | ICD-10-CM

## 2023-05-29 DIAGNOSIS — E875 Hyperkalemia: Secondary | ICD-10-CM

## 2023-05-29 DIAGNOSIS — J441 Chronic obstructive pulmonary disease with (acute) exacerbation: Secondary | ICD-10-CM

## 2023-05-29 DIAGNOSIS — R339 Retention of urine, unspecified: Secondary | ICD-10-CM | POA: Diagnosis not present

## 2023-05-29 DIAGNOSIS — K649 Unspecified hemorrhoids: Secondary | ICD-10-CM

## 2023-05-29 LAB — COMPREHENSIVE METABOLIC PANEL
ALT: 52 U/L — ABNORMAL HIGH (ref 0–35)
AST: 26 U/L (ref 0–37)
Albumin: 4.3 g/dL (ref 3.5–5.2)
Alkaline Phosphatase: 66 U/L (ref 39–117)
BUN: 27 mg/dL — ABNORMAL HIGH (ref 6–23)
CO2: 31 meq/L (ref 19–32)
Calcium: 9.3 mg/dL (ref 8.4–10.5)
Chloride: 97 meq/L (ref 96–112)
Creatinine, Ser: 1.32 mg/dL — ABNORMAL HIGH (ref 0.40–1.20)
GFR: 41.25 mL/min — ABNORMAL LOW (ref >60.00)
Glucose, Bld: 165 mg/dL — ABNORMAL HIGH (ref 70–99)
Potassium: 3.9 meq/L (ref 3.5–5.1)
Sodium: 139 meq/L (ref 135–145)
Total Bilirubin: 0.6 mg/dL (ref 0.2–1.2)
Total Protein: 6.3 g/dL (ref 6.0–8.3)

## 2023-05-29 NOTE — Progress Notes (Signed)
Subjective:    Patient ID: Kim [name], female    DOB: 15-Mar-1956, 68 y.o.   MRN: [id number]  HPI 68 year old female who  has a past medical history of C8 RADICULOPATHY (06/05/2009), Chronic kidney disease, stage 3a (HCC) (08/26/2021), COPD (08/31/2007), DEPRESSION (11/19/2006), DYSLIPIDEMIA (03/12/2009), MI (mitral incompetence), NEPHROLITHIASIS (01/05/2008), OSTEOARTHRITIS (06/05/2009), PARESTHESIA (08/24/2007), and TOBACCO ABUSE (01/25/2010).  She presents to the office today for hospital follow up   Admit Date 05/12/2023 Discharge Date 05/18/2023  She initially presented to the ED via EMS the day before for shortness of breath and complaint of urinary retention and suprapubic discomfort.  A bladder scan confirmed urinary retention and a Foley catheter was placed with 650 mL of urine output.  She also reported a new rash on her right buttocks that was consistent with shingles and was given a prescription for Valtrex.  She was discharged with a Foley catheter in place and follow-up with urology.  She then returned to the ED the following day with complaint of continued shortness of breath.  She reported a new cough over the last 3 days with green sputum production.  She normally wears O2 at 2.5 L via nasal cannula while at rest but had to increase to 3 L with activity.  Shortness of breath had not improved with her home inhalers therefore she came to the ED for further evaluation.  Hospital Course  She was found to have an acute exacerbation of COPD and treated with corticosteroids.  At the time of discharge her breathing had not returned to baseline and not but oxygen was stable on home 3 L.  The Foley was originally discontinued during this hospitalization but on day of discharge patient reported several days of suprapubic pain and difficulty urinating.  Bladder scan showed patient continued to retain 700 mL so Foley catheter was placed and patient was advised to follow-up with urology  outpatient.  Valtrex was discontinued as it was not thought that her rash was shingles.She stopped her new nebulizer of Ohtuveyre which she believes caused the rash.   She had mild hyperkalemia during admission, her home potassium was held and advised follow-up with PCP for repeat blood work  Today she reports that her breathing is nearly back to baseline, she is between 2-1/2 and 3 L via nasal cannula.  She continues to have a rash on her left buttocks but this is improved significantly and is almost completely resolved.  She still has a pressure-like sensation where the rashes.  Her biggest issue today is that of hemorrhoid.  She reports that she was constipated before going into the hospital and when she had a bowel movement it caused a hemorrhoid.  She feels pressure at the site of the hemorrhoid as well as irritation.  She has been using Anusol cream and suppositories without relief.   Review of Systems  Constitutional:  Positive for fatigue.  Eyes: Negative.   Respiratory:  Positive for shortness of breath.   Cardiovascular: Negative.   Gastrointestinal: Negative.   Endocrine: Negative.   Genitourinary: Negative.   Musculoskeletal:  Positive for arthralgias and gait problem.  Skin: Negative.   Allergic/Immunologic: Negative.   Hematological: Negative.    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/2008   OSTEOARTHRITIS 06/05/2009   PARESTHESIA 08/24/2007  TOBACCO ABUSE 01/25/2010    Social History   Socioeconomic History   Marital status: Married    Spouse name: Thereasa Distance   Number of children: 2   Years of education: Not on file   Highest education level: Not on file  Occupational History   Occupation: Patient care aide-sits with elderly lady  Tobacco Use   Smoking status:  Former    Current packs/day: 0.00    Average packs/day: 2.0 packs/day for 38.0 years (76.0 ttl pk-yrs)    Types: Cigarettes    Start date: 03/07/1976    Quit date: 03/07/2014    Years since quitting: 9.2   Smokeless tobacco: Never  Vaping Use   Vaping status: Never Used  Substance and Sexual Activity   Alcohol use: No    Alcohol/week: 0.0 standard drinks of alcohol   Drug use: No   Sexual activity: Not on file  Other Topics Concern   Not on file  Social History Narrative   [facility] (01/12/17):   Originally from [facility]. Has lived in [place] for 17 years. Married. Has 2 dogs currently. No mold and only 1 indoor plant. Had her home professionally cleaned a month ago. No bird or hot tub exposure. 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    Right handed    Social Drivers of Health   Financial Resource Strain: High Risk (09/10/2021)   Overall Financial Resource Strain (CARDIA)    Difficulty of Paying Living Expenses: Very hard  Food Insecurity: No Food Insecurity (05/17/2023)   Hunger Vital Sign    Worried About Running Out of Food in the Last Year: Never true    Ran Out of Food in the Last Year: Never true  Transportation Needs: No Transportation Needs (05/17/2023)   PRAPARE - Administrator, Civil Service (Medical): No    Lack of Transportation (Non-Medical): No  Physical Activity: Inactive (03/25/2021)   Exercise Vital Sign    Days of Exercise per Week: 0 days    Minutes of Exercise per Session: 0 min  Stress: No Stress Concern Present (09/10/2021)   [employer] of Occupational Health - Occupational Stress Questionnaire    Feeling of Stress : Not at all  Social Connections: Socially Isolated (05/13/2023)   Social Connection and Isolation Panel [NHANES]    Frequency of Communication with Friends and Family: Once a week    Frequency of Social Gatherings with Friends and Family: Once a week    Attends Religious Services: Never    Automotive engineer or Organizations: No    Attends Banker Meetings: Never    Marital Status: Married  Catering manager Violence: Not At Risk (05/17/2023)   Humiliation, Afraid, Rape, and Kick questionnaire    Fear of Current or Ex-Partner: No    Emotionally Abused: No    Physically Abused: No    Sexually Abused: No    Past Surgical History:  Procedure Laterality Date   ABDOMINAL HYSTERECTOMY     APPENDECTOMY  1969   CARDIAC CATHETERIZATION N/A 07/05/2015   Procedure: Left Heart Cath and Coronary Angiography;  Surgeon: Marykay Lex, MD;  Location: [facility] INVASIVE CV LAB;  Service: Cardiovascular;  Laterality: N/A;   CHOLECYSTECTOMY  1989   collapse lung  1990   COLONOSCOPY WITH PROPOFOL N/A 05/14/2018   Procedure: COLONOSCOPY WITH PROPOFOL;  Surgeon: Hilarie Fredrickson, MD;  Location: [facility] ENDOSCOPY;  Service: Endoscopy;  Laterality: N/A;   EXCISION MASS  ABDOMINAL  2019   EXPLORATORY LAPAROTOMY  1969   LEFT HEART CATH AND CORONARY ANGIOGRAPHY N/A 11/19/2020   Procedure: LEFT HEART CATH AND CORONARY ANGIOGRAPHY;  Surgeon: Yvonne Kendall, MD;  Location: [facility] INVASIVE CV LAB;  Service: Cardiovascular;  Laterality: N/A;   POLYPECTOMY  05/14/2018   Procedure: POLYPECTOMY;  Surgeon: Hilarie Fredrickson, MD;  Location: [facility] ENDOSCOPY;  Service: Endoscopy;;   SALPINGOOPHORECTOMY  2019    Family History  Problem Relation Age of Onset   Cancer Mother        lung   Heart attack Mother    Hypertension Sister    Multiple sclerosis Brother    Diabetes Sister    Rheum arthritis Sister    Thyroid disease Sister    Multiple sclerosis Other    Alcohol abuse Other    Arthritis Other    Diabetes Other    Kidney disease Other    Cancer Other        lung,ovarian,skin, uterine   Stroke Other    Heart disease Other    Melanoma Other    Osteoporosis Other    Heart attack Maternal Uncle    Heart attack Other        NEICE   Heart attack Other        NEPHEW   Hypertension Brother    Stroke Maternal  Grandmother    Colon cancer Neg Hx     Allergies  Allergen Reactions   Tape Other (See Comments)    Skin tears easily, peels skin off   Daliresp [Roflumilast] Other (See Comments)    Skin peeling   Cozaar [Losartan] Other (See Comments)    Possible cause of severe hypotension   Dupixent [Dupilumab] Diarrhea, Swelling, Other (See Comments) and Cough    Facial swelling Headache   Latex Other (See Comments)    Peels skin off   Penicillins Hives   Statins Other (See Comments)    Myalgia    Torsemide Other (See Comments)    Severe stomach cramps and all over   Zestril [Lisinopril] Cough   Omnicef [Cefdinir] Rash    Current Outpatient Medications on File Prior to Visit  Medication Sig Dispense Refill   albuterol (VENTOLIN HFA) 108 (90 Base) MCG/ACT inhaler INHALE 2 PUFFS INTO THE LUNGS EVERY 4 TO 6 HOURS AS NEEDED FOR SHORTNESS OF BREATH OR WHEEZING (Patient taking differently: Inhale 2 puffs into the lungs every 3 (three) hours as needed for wheezing or shortness of breath.) 18 g 3   ALPRAZolam (XANAX) 1 MG tablet Take 1 mg by mouth 3 (three) times daily as needed for anxiety.     aspirin EC 81 MG EC tablet Take 1 tablet (81 mg total) by mouth daily. (Patient taking differently: Take 81 mg by mouth every Monday, Wednesday, and Friday.)     butalbital-acetaminophen-caffeine (FIORICET) 50-325-40 MG tablet Take 1 tablet by mouth every 4 (four) hours as needed for headache. TAKE 1 TABLET BY MOUTH EVERY 4 HOURS AS NEEDED FOR HEADACHETAKE 1 TABLET BY MOUTH EVERY 4 HOURS AS NEEDED FOR HEADACHE (Patient taking differently: Take 1 tablet by mouth every 4 (four) hours as needed for headache.) 14 tablet 3   cetirizine (ZYRTEC) 10 MG tablet Take 10 mg by mouth daily.     Cholecalciferol (VITAMIN D3) 125 MCG (5000 UT) CAPS Take 5,000 Units by mouth daily.     Cyanocobalamin (VITAMIN B-12) 5000 MCG SUBL Place 5,000 mcg under the tongue daily.     ezetimibe (ZETIA)  10 MG tablet TAKE 1 TABLET(10 MG)  BY MOUTH DAILY (Patient taking differently: Take 10 mg by mouth in the morning.) 90 tablet 3   furosemide (LASIX) 40 MG tablet Take 2 tablets (80 mg total) by mouth daily. (Patient taking differently: Take 80 mg by mouth at bedtime.) 180 tablet 3   HYDROcodone-acetaminophen (NORCO) 7.5-325 MG tablet Take 1 tablet by mouth every 6 (six) hours as needed for moderate pain (pain score 4-6). 45 tablet 0   hydrocortisone (ANUSOL-HC) 2.5 % rectal cream Place 1 Application rectally 2 (two) times daily. 30 g 0   magnesium oxide (MAG-OX) 400 MG tablet Take 400 mg by mouth daily.     MUCINEX MAXIMUM STRENGTH 1200 MG TB12 Take 1,200 mg by mouth every 12 (twelve) hours.     nitroGLYCERIN (NITROSTAT) 0.4 MG SL tablet Place 1 tablet (0.4 mg total) under the tongue every 5 (five) minutes as needed for chest pain. 25 tablet 3   nystatin (MYCOSTATIN) 100000 UNIT/ML suspension Take 5 mLs (500,000 Units total) by mouth 4 (four) times daily. (Patient taking differently: Take 5 mLs by mouth 4 (four) times daily as needed (for thrush).) 473 mL 2   OHTUVAYRE 3 MG/2.5ML SUSP Take 3 mg by nebulization in the morning and at bedtime.     OXYGEN Inhale 2.5-3.5 L/min into the lungs continuous.     PARoxetine (PAXIL) 40 MG tablet Take 0.5 tablets (20 mg total) by mouth in the morning. TAKE 1 TABLET(40 MG) BY MOUTH EVERY MORNING (Patient taking differently: Take 40 mg by mouth in the morning.) 90 tablet 1   predniSONE (DELTASONE) 10 MG tablet 4 tabs daily for 3 days then 3 tabs daily for 3 days then 2 tabs daily for 3 days then 1 tab daily 60 tablet 3   rosuvastatin (CRESTOR) 20 MG tablet Take 1 tablet (20 mg total) by mouth daily. 90 tablet 3   SENOKOT 8.6 MG tablet Take 1 tablet by mouth 2 (two) times daily as needed for constipation.     spironolactone (ALDACTONE) 25 MG tablet TAKE 1/2 TABLET(12.5 MG) BY MOUTH DAILY (Patient taking differently: Take 12.5 mg by mouth in the morning.) 45 tablet 2   budesonide-formoterol (SYMBICORT)  160-4.5 MCG/ACT inhaler Inhale 2 puffs into the lungs 2 (two) times daily. 10.2 g 0   ipratropium-albuterol (DUONEB) 0.5-2.5 (3) MG/3ML SOLN Take 3 mLs by nebulization every 4 (four) hours as needed. (Patient taking differently: Take 3 mLs by nebulization every 2 (two) hours as needed (for shortness of breath or wheezing).) 180 mL 0   No current facility-administered medications on file prior to visit.    BP (!) 140/80   Pulse 95   Temp 98.3 F (36.8 C) (Oral)   Ht 5' 1.5" (1.562 m)   Wt 157 lb (71.2 kg)   SpO2 96%   BMI 29.18 kg/m       Objective:   Physical Exam Vitals and nursing note reviewed.  Constitutional:      Appearance: Normal appearance. She is ill-appearing (chronically).  Cardiovascular:     Rate and Rhythm: Normal rate and regular rhythm.     Pulses: Normal pulses.     Heart sounds: Normal heart sounds.  Pulmonary:     Breath sounds: Decreased air movement present. No wheezing, rhonchi or rales.  Genitourinary:    Rectum: External hemorrhoid present.     Comments: Hemorrhoid not engorged Foley catheter in placed    Skin:    General: Skin is warm and  dry.  Neurological:     Mental Status: She is alert.  Psychiatric:        Mood and Affect: Mood normal.        Behavior: Behavior normal.        Thought Content: Thought content normal.        Judgment: Judgment normal.       Assessment & Plan:  1. COPD with acute exacerbation (HCC) (Primary) -Reviewed hospital notes, labs, imaging, discharge instructions, and medication changes with the patient and her husband.  All questions answered to the best of my ability.  She seems to be back to baseline.  Advise following up with pulmonary as directed  2. Urinary retention - Follow up with Urology as directed  3. Rash - Nearly resolved.   4. Hyperkalemia  - Comprehensive metabolic panel; Future - Comprehensive metabolic panel  5. Hemorrhoids, unspecified hemorrhoid type -He is using Epsom salt bath a  couple times a day for a week to see if this resolves her hemorrhoid.  She can also try using hemorrhoidal pads.  Shirline Frees, NP

## 2023-06-02 ENCOUNTER — Telehealth: Payer: Self-pay | Admitting: Cardiovascular Disease

## 2023-06-02 ENCOUNTER — Telehealth: Payer: Self-pay

## 2023-06-02 ENCOUNTER — Encounter: Payer: Self-pay | Admitting: Adult Health

## 2023-06-02 NOTE — Telephone Encounter (Signed)
 Noted.

## 2023-06-02 NOTE — Telephone Encounter (Signed)
 Left voicemail to return call to office

## 2023-06-02 NOTE — Telephone Encounter (Signed)
 Copied from CRM [id number]. Topic: Clinical - Home Health Verbal Orders >> Jun 02, 2023  3:55 PM Adele Barthel wrote: Caller/Agency: [facility] Callback Number: [phone] Service Requested: Physical Therapy Frequency: 1 x week for 4 weeks, then 1 x week every other week for 4 weeks.  Any new concerns about the patient? Yes  -Patient has missed physical therapy appointment this week, due to reporting severe buttock pain that prevents her from participating. Marcelino Duster will try again later this week.  -Patient advised she is no longer using the OHTUVAYRE 3 MG/2.5ML SUSP in her nebulizer, due to giving her a bad reaction. Physical therapist wants to confirm if she should be continuing to use the medication or can discontinue.  -When patient was seen by physical therapy on Saturday, she had elevated blood pressure.  Her initial reading was 154/58 with a heart rate of 92 while sitting.  After standing for 1-2 minutes, her blood pressure was 178/98  with a heart rate of 137. After sitting down and taking bisoprolol, her blood pressure was 120/78 with a heart rate of 122. Marcelino Duster, the therapist will be contacting the cardiologist.   Marcelino Duster, PT does request call back [phone]

## 2023-06-02 NOTE — Telephone Encounter (Signed)
Okay for verbal orders? Please advise 

## 2023-06-02 NOTE — Telephone Encounter (Signed)
 Caller Marcelino Duster at [facility]) returned RN's call.

## 2023-06-02 NOTE — Telephone Encounter (Signed)
 Marcelino Duster from [facility] called to give update on patient. Patient's BP was:  154/58; 92 178/98; 137 120/78; 122  Patient is taking a quarter tablet of bisoprolol 5MG  as needed every few days. Patient takes medication after heart rate is over 120. Medication is not on patient's medication list as one to take

## 2023-06-03 ENCOUNTER — Other Ambulatory Visit (HOSPITAL_COMMUNITY): Payer: Self-pay

## 2023-06-03 ENCOUNTER — Ambulatory Visit (INDEPENDENT_AMBULATORY_CARE_PROVIDER_SITE_OTHER): Payer: Medicare Other | Admitting: Adult Health

## 2023-06-03 VITALS — BP 130/70 | HR 93 | Temp 97.2°F | Ht 61.5 in | Wt 159.0 lb

## 2023-06-03 DIAGNOSIS — G8929 Other chronic pain: Secondary | ICD-10-CM | POA: Diagnosis not present

## 2023-06-03 DIAGNOSIS — M150 Primary generalized (osteo)arthritis: Secondary | ICD-10-CM

## 2023-06-03 DIAGNOSIS — R319 Hematuria, unspecified: Secondary | ICD-10-CM | POA: Diagnosis not present

## 2023-06-03 MED ORDER — BISOPROLOL FUMARATE 5 MG PO TABS
2.5000 mg | ORAL_TABLET | Freq: Every day | ORAL | 3 refills | Status: DC
  Filled 2023-06-03: qty 45, 90d supply, fill #0

## 2023-06-03 NOTE — Telephone Encounter (Signed)
 I previously advised her to take bisoprolol 2.5 mg daily for HR > 110 bpm.  I have added the medication back to her medication list.

## 2023-06-03 NOTE — Progress Notes (Signed)
 Subjective:    Patient ID: Kim [name], female    DOB: [birth year], 68 y.o.   MRN: [id number]  HPI  68 year old female who  has a past medical history of C8 RADICULOPATHY (06/05/2009), Chronic kidney disease, stage 3a (HCC) (08/26/2021), COPD (08/31/2007), DEPRESSION (11/19/2006), DYSLIPIDEMIA (03/12/2009), MI (mitral incompetence), NEPHROLITHIASIS (01/05/2008), OSTEOARTHRITIS (06/05/2009), PARESTHESIA (08/24/2007), and TOBACCO ABUSE (01/25/2010).   She presents to the office today for chronic pain management visit   NCCSRS reviewed in EPIC   Indication for chronic opioid: Osteoarthritis involving multiple joints  Medication and dose: Norco 7.5-325 mg Q6 H PRN  # pills per month: 45 Last UDS date:02/05/2023 Opioid Treatment Agreement signed: 02/05/2023 Opioid Treatment Agreement last reviewed with patient: Today  NCCSRS reviewed this encounter (include red flags):  Today     Review of Systems See HPI   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/2008   OSTEOARTHRITIS 06/05/2009   PARESTHESIA 08/24/2007   TOBACCO ABUSE 01/25/2010    Social History   Socioeconomic History   Marital status: Married    Spouse name: Thereasa Distance   Number of children: 2   Years of education: Not on file   Highest education level: Not on file  Occupational History   Occupation: Patient care aide-sits with elderly lady  Tobacco Use   Smoking status: Former    Current packs/day: 0.00    Average packs/day: 2.0 packs/day for 38.0 years (76.0 ttl pk-yrs)    Types: Cigarettes    Start date: 03/07/1976    Quit date: 03/07/2014    Years since quitting: 9.2   Smokeless tobacco: Never  Vaping Use   Vaping status: Never Used  Substance and Sexual Activity   Alcohol use: No    Alcohol/week: 0.0  standard drinks of alcohol   Drug use: No   Sexual activity: Not on file  Other Topics Concern   Not on file  Social History Narrative   [facility] (01/12/17):   Originally from [facility]. Has lived in [place] for 17 years. Married. Has 2 dogs currently. No mold and only 1 indoor plant. Had her home professionally cleaned a month ago. No bird or hot tub exposure. 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    Right handed    Social Drivers of Health   Financial Resource Strain: High Risk (09/10/2021)   Overall Financial Resource Strain (CARDIA)    Difficulty of Paying Living Expenses: Very hard  Food Insecurity: No Food Insecurity (05/17/2023)   Hunger Vital Sign    Worried About Running Out of Food in the Last Year: Never true    Ran Out of Food in the Last Year: Never true  Transportation Needs: No Transportation Needs (05/17/2023)   PRAPARE - Administrator, Civil Service (Medical): No    Lack of Transportation (Non-Medical): No  Physical Activity: Inactive (03/25/2021)   Exercise Vital Sign    Days of Exercise per Week: 0 days    Minutes of Exercise per Session: 0 min  Stress: No Stress Concern Present (09/10/2021)   [employer] of Occupational Health - Occupational Stress Questionnaire    Feeling of Stress : Not at all  Social Connections: Socially Isolated (05/13/2023)   Social Connection and Isolation Panel [NHANES]  Frequency of Communication with Friends and Family: Once a week    Frequency of Social Gatherings with Friends and Family: Once a week    Attends Religious Services: Never    Database administrator or Organizations: No    Attends Banker Meetings: Never    Marital Status: Married  Catering manager Violence: Not At Risk (05/17/2023)   Humiliation, Afraid, Rape, and Kick questionnaire    Fear of Current or Ex-Partner: No    Emotionally Abused: No    Physically Abused: No    Sexually Abused: No    Past Surgical  History:  Procedure Laterality Date   ABDOMINAL HYSTERECTOMY     APPENDECTOMY  1969   CARDIAC CATHETERIZATION N/A 07/05/2015   Procedure: Left Heart Cath and Coronary Angiography;  Surgeon: Marykay Lex, MD;  Location: [facility] INVASIVE CV LAB;  Service: Cardiovascular;  Laterality: N/A;   CHOLECYSTECTOMY  1989   collapse lung  1990   COLONOSCOPY WITH PROPOFOL N/A 05/14/2018   Procedure: COLONOSCOPY WITH PROPOFOL;  Surgeon: Hilarie Fredrickson, MD;  Location: [facility] ENDOSCOPY;  Service: Endoscopy;  Laterality: N/A;   EXCISION MASS ABDOMINAL  2019   EXPLORATORY LAPAROTOMY  1969   LEFT HEART CATH AND CORONARY ANGIOGRAPHY N/A 11/19/2020   Procedure: LEFT HEART CATH AND CORONARY ANGIOGRAPHY;  Surgeon: Yvonne Kendall, MD;  Location: [facility] INVASIVE CV LAB;  Service: Cardiovascular;  Laterality: N/A;   POLYPECTOMY  05/14/2018   Procedure: POLYPECTOMY;  Surgeon: Hilarie Fredrickson, MD;  Location: [facility] ENDOSCOPY;  Service: Endoscopy;;   SALPINGOOPHORECTOMY  2019    Family History  Problem Relation Age of Onset   Cancer Mother        lung   Heart attack Mother    Hypertension Sister    Multiple sclerosis Brother    Diabetes Sister    Rheum arthritis Sister    Thyroid disease Sister    Multiple sclerosis Other    Alcohol abuse Other    Arthritis Other    Diabetes Other    Kidney disease Other    Cancer Other        lung,ovarian,skin, uterine   Stroke Other    Heart disease Other    Melanoma Other    Osteoporosis Other    Heart attack Maternal Uncle    Heart attack Other        NEICE   Heart attack Other        NEPHEW   Hypertension Brother    Stroke Maternal Grandmother    Colon cancer Neg Hx     Allergies  Allergen Reactions   Tape Other (See Comments)    Skin tears easily, peels skin off   Daliresp [Roflumilast] Other (See Comments)    Skin peeling   Cozaar [Losartan] Other (See Comments)    Possible cause of severe hypotension   Dupixent [Dupilumab] Diarrhea, Swelling, Other (See Comments) and  Cough    Facial swelling Headache   Latex Other (See Comments)    Peels skin off   Penicillins Hives   Statins Other (See Comments)    Myalgia    Torsemide Other (See Comments)    Severe stomach cramps and all over   Zestril [Lisinopril] Cough   Omnicef [Cefdinir] Rash    Current Outpatient Medications on File Prior to Visit  Medication Sig Dispense Refill   albuterol (VENTOLIN HFA) 108 (90 Base) MCG/ACT inhaler INHALE 2 PUFFS INTO THE LUNGS EVERY 4 TO 6 HOURS AS NEEDED FOR SHORTNESS  OF BREATH OR WHEEZING (Patient taking differently: Inhale 2 puffs into the lungs every 3 (three) hours as needed for wheezing or shortness of breath.) 18 g 3   ALPRAZolam (XANAX) 1 MG tablet Take 1 mg by mouth 3 (three) times daily as needed for anxiety.     aspirin EC 81 MG EC tablet Take 1 tablet (81 mg total) by mouth daily. (Patient taking differently: Take 81 mg by mouth every Monday, Wednesday, and Friday.)     bisoprolol (ZEBETA) 5 MG tablet Take 0.5 tablets (2.5 mg total) by mouth daily. 45 tablet 3   butalbital-acetaminophen-caffeine (FIORICET) 50-325-40 MG tablet Take 1 tablet by mouth every 4 (four) hours as needed for headache. TAKE 1 TABLET BY MOUTH EVERY 4 HOURS AS NEEDED FOR HEADACHETAKE 1 TABLET BY MOUTH EVERY 4 HOURS AS NEEDED FOR HEADACHE (Patient taking differently: Take 1 tablet by mouth every 4 (four) hours as needed for headache.) 14 tablet 3   cetirizine (ZYRTEC) 10 MG tablet Take 10 mg by mouth daily.     Cholecalciferol (VITAMIN D3) 125 MCG (5000 UT) CAPS Take 5,000 Units by mouth daily.     Cyanocobalamin (VITAMIN B-12) 5000 MCG SUBL Place 5,000 mcg under the tongue daily.     ezetimibe (ZETIA) 10 MG tablet TAKE 1 TABLET(10 MG) BY MOUTH DAILY (Patient taking differently: Take 10 mg by mouth in the morning.) 90 tablet 3   furosemide (LASIX) 40 MG tablet Take 2 tablets (80 mg total) by mouth daily. (Patient taking differently: Take 80 mg by mouth at bedtime.) 180 tablet 3    HYDROcodone-acetaminophen (NORCO) 7.5-325 MG tablet Take 1 tablet by mouth every 6 (six) hours as needed for moderate pain (pain score 4-6). 45 tablet 0   magnesium oxide (MAG-OX) 400 MG tablet Take 400 mg by mouth daily.     MUCINEX MAXIMUM STRENGTH 1200 MG TB12 Take 1,200 mg by mouth every 12 (twelve) hours.     nitroGLYCERIN (NITROSTAT) 0.4 MG SL tablet Place 1 tablet (0.4 mg total) under the tongue every 5 (five) minutes as needed for chest pain. 25 tablet 3   nystatin (MYCOSTATIN) 100000 UNIT/ML suspension Take 5 mLs (500,000 Units total) by mouth 4 (four) times daily. (Patient taking differently: Take 5 mLs by mouth 4 (four) times daily as needed (for thrush).) 473 mL 2   OHTUVAYRE 3 MG/2.5ML SUSP Take 3 mg by nebulization in the morning and at bedtime.     OXYGEN Inhale 2.5-3.5 L/min into the lungs continuous.     PARoxetine (PAXIL) 40 MG tablet Take 0.5 tablets (20 mg total) by mouth in the morning. TAKE 1 TABLET(40 MG) BY MOUTH EVERY MORNING (Patient taking differently: Take 40 mg by mouth in the morning.) 90 tablet 1   predniSONE (DELTASONE) 10 MG tablet 4 tabs daily for 3 days then 3 tabs daily for 3 days then 2 tabs daily for 3 days then 1 tab daily 60 tablet 3   rosuvastatin (CRESTOR) 20 MG tablet Take 1 tablet (20 mg total) by mouth daily. 90 tablet 3   SENOKOT 8.6 MG tablet Take 1 tablet by mouth 2 (two) times daily as needed for constipation.     spironolactone (ALDACTONE) 25 MG tablet TAKE 1/2 TABLET(12.5 MG) BY MOUTH DAILY (Patient taking differently: Take 12.5 mg by mouth in the morning.) 45 tablet 2   budesonide-formoterol (SYMBICORT) 160-4.5 MCG/ACT inhaler Inhale 2 puffs into the lungs 2 (two) times daily. 10.2 g 0   ipratropium-albuterol (DUONEB) 0.5-2.5 (3) MG/3ML  SOLN Take 3 mLs by nebulization every 4 (four) hours as needed. (Patient taking differently: Take 3 mLs by nebulization every 2 (two) hours as needed (for shortness of breath or wheezing).) 180 mL 0   No current  facility-administered medications on file prior to visit.    BP 130/70   Pulse 93   Temp (!) 97.2 F (36.2 C) (Tympanic)   Ht 5' 1.5" (1.562 m)   Wt 159 lb (72.1 kg)   SpO2 99%   BMI 29.56 kg/m       Objective:   Physical Exam Vitals and nursing note reviewed.  Constitutional:      Appearance: Normal appearance. She is ill-appearing (chronically).  Cardiovascular:     Rate and Rhythm: Normal rate and regular rhythm.     Pulses: Normal pulses.     Heart sounds: Normal heart sounds.  Pulmonary:     Effort: Pulmonary effort is normal.     Breath sounds: Normal breath sounds.     Comments: Chronic 02 via [place]  Skin:    General: Skin is warm and dry.  Neurological:     General: No focal deficit present.     Mental Status: She is alert and oriented to person, place, and time.     Gait: Gait abnormal (in wheelchair).  Psychiatric:        Mood and Affect: Mood normal.        Behavior: Behavior normal.        Thought Content: Thought content normal.        Judgment: Judgment normal.        Assessment & Plan:  1. Encounter for chronic pain management  - DRUG MONITOR, PANEL 1, W/CONF, URINE  2. Primary osteoarthritis involving multiple joints (Primary)  - DRUG MONITOR, PANEL 1, W/CONF, URINE  3. Hematuria, unspecified type - Likely from catheter irritation. She reports her urine was clear yellow this morning and yesterday when her bag was emptied. She has an appointment with Urology in a few days. She does not have any UTI like symptoms   Shirline Frees, NP

## 2023-06-03 NOTE — Telephone Encounter (Signed)
 Kim [name] with [facility] and the pt called and reinforced Jackelyn Knife recommendations.

## 2023-06-03 NOTE — Telephone Encounter (Signed)
Verbal orders given to Michelle. 

## 2023-06-05 LAB — DRUG MONITOR, PANEL 1, W/CONF, URINE
Alphahydroxyalprazolam: 730 ng/mL — ABNORMAL HIGH (ref <25)
Alphahydroxymidazolam: NEGATIVE ng/mL (ref <50)
Alphahydroxytriazolam: NEGATIVE ng/mL (ref <50)
Aminoclonazepam: NEGATIVE ng/mL (ref <25)
Amphetamines: NEGATIVE ng/mL (ref <500)
Barbiturates: NEGATIVE ng/mL (ref <300)
Benzodiazepines: POSITIVE ng/mL — AB (ref <100)
Cocaine Metabolite: NEGATIVE ng/mL (ref <150)
Codeine: NEGATIVE ng/mL (ref <50)
Creatinine: 152.9 mg/dL (ref >=20.0)
Hydrocodone: 1581 ng/mL — ABNORMAL HIGH (ref <50)
Hydromorphone: 119 ng/mL — ABNORMAL HIGH (ref <50)
Hydroxyethylflurazepam: NEGATIVE ng/mL (ref <50)
Lorazepam: NEGATIVE ng/mL (ref <50)
Marijuana Metabolite: NEGATIVE ng/mL (ref <20)
Methadone Metabolite: NEGATIVE ng/mL (ref <100)
Morphine: NEGATIVE ng/mL (ref <50)
Nordiazepam: NEGATIVE ng/mL (ref <50)
Norhydrocodone: 1707 ng/mL — ABNORMAL HIGH (ref <50)
Opiates: POSITIVE ng/mL — AB (ref <100)
Oxazepam: NEGATIVE ng/mL (ref <50)
Oxidant: NEGATIVE ug/mL (ref <200)
Oxycodone: NEGATIVE ng/mL (ref <100)
Phencyclidine: NEGATIVE ng/mL (ref <25)
Temazepam: NEGATIVE ng/mL (ref <50)
pH: 6.7 (ref 4.5–9.0)

## 2023-06-05 LAB — DM TEMPLATE

## 2023-06-09 ENCOUNTER — Other Ambulatory Visit: Payer: Self-pay | Admitting: Adult Health

## 2023-06-09 DIAGNOSIS — M159 Polyosteoarthritis, unspecified: Secondary | ICD-10-CM

## 2023-06-09 DIAGNOSIS — G8929 Other chronic pain: Secondary | ICD-10-CM

## 2023-06-09 MED ORDER — HYDROCODONE-ACETAMINOPHEN 7.5-325 MG PO TABS: 1.0000 | ORAL_TABLET | Freq: Four times a day (QID) | ORAL | 0 refills | Status: DC | PRN

## 2023-06-15 ENCOUNTER — Ambulatory Visit: Admitting: Family Medicine

## 2023-06-16 ENCOUNTER — Ambulatory Visit (HOSPITAL_BASED_OUTPATIENT_CLINIC_OR_DEPARTMENT_OTHER): Payer: Medicare Other | Admitting: Pulmonary Disease

## 2023-06-16 ENCOUNTER — Encounter (HOSPITAL_BASED_OUTPATIENT_CLINIC_OR_DEPARTMENT_OTHER): Payer: Self-pay | Admitting: Pulmonary Disease

## 2023-06-16 VITALS — BP 118/64 | HR 88 | Ht 61.5 in | Wt 159.6 lb

## 2023-06-16 DIAGNOSIS — J9611 Chronic respiratory failure with hypoxia: Secondary | ICD-10-CM | POA: Diagnosis not present

## 2023-06-16 DIAGNOSIS — J441 Chronic obstructive pulmonary disease with (acute) exacerbation: Secondary | ICD-10-CM

## 2023-06-16 DIAGNOSIS — R339 Retention of urine, unspecified: Secondary | ICD-10-CM | POA: Diagnosis not present

## 2023-06-16 DIAGNOSIS — K5900 Constipation, unspecified: Secondary | ICD-10-CM

## 2023-06-16 DIAGNOSIS — J9612 Chronic respiratory failure with hypercapnia: Secondary | ICD-10-CM

## 2023-06-16 DIAGNOSIS — B028 Zoster with other complications: Secondary | ICD-10-CM

## 2023-06-16 NOTE — Patient Instructions (Addendum)
 Continue on prednisone  Let you decide about resuming Ohtuwayre nebs  VISIT SUMMARY:  During today's visit, we discussed several health concerns including your recent COPD exacerbation, urinary retention, constipation, hemorrhoids, and a painful rash. We reviewed your current medications and made adjustments to better manage your symptoms and improve your overall comfort.  YOUR PLAN:  -COPD EXACERBATION: COPD exacerbation means a worsening of your chronic lung disease, which can cause difficulty breathing. We believe this was due to stopping your Lasix, leading to fluid buildup. You should resume taking Lasix to help manage fluid retention, and we may consider restarting your nebulizer treatment if it helps your breathing.  -URINARY RETENTION: Urinary retention is when you are unable to empty your bladder completely. This required the placement of a Foley catheter. We will continue managing the catheter and consult with a urologist for further evaluation. You should continue using laxatives like Miralax and Dulcolax to help with constipation, which may be contributing to this issue. Follow up with your urologist in three weeks.

## 2023-06-16 NOTE — Progress Notes (Signed)
 Subjective:    Patient ID: Kim [name], female    DOB: [birth year], 68 y.o.   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  She improved with diuresis and steroids and got readmitted for 3 days for more diuresis.  She was again readmitted 7/4 to 10/15/22 for COPD/CHF exacerbation with acute diuresis this time discharged with palliative care follow-up at home .  ED  visit 05/11/23 for shortness of breath and urinary retention. Foley catheter was placed with 650 mL of urine out. She also reported new rash on her right buttocks that was consistent with shingles rash and she was given a prescription for Valtrex.    Kim [name], a patient with a history of end-stage COPD, recently experienced a COPD exacerbation leading to hospitalization. She also had urinary retention, for which a Foley catheter was placed. Since her last visit, she has been experiencing a rash, initially thought to be shingles, but the diagnosis is now uncertain. The rash started on her right buttock and spread around the area, causing significant discomfort described as a burning sensation and feeling like "sitting on a knife blade." The rash has dried up but continues to cause pain and  itching.  Kim [name] also reports constipation, which has been resistant to treatment with Miralax and Dulcolax. She has been taking hydrocodone for pain, which may be contributing to the constipation. She has been off Lasix for two days due to discomfort from a hemorrhoid, which may have contributed to her recent COPD exacerbation.  She recently started a new nebulizer medication, which she stopped after two days due to the onset of the rash. She reports that the nebulizer helped her breathing for about an hour to an hour and a half after use, but she felt unwell as the evening went on.    Significant tests/ events reviewed   09/2020 ABG 7.3 7/46/82 01/2017 alpha-1 >> 102 nml level ,PI* M1N   11/2014: FVC 2.00 L (63%) FEV1 1.00 L (41%) ratio 50  negative bronchodilator response TLC 5.06 L (103%) RV 140% DLCO uncorrected 51%   05/2022 LDCT chest >> unchanged nodules     CT angiogram chest 03/24/2020  Severe centrilobular emphysema with mild superimposed diffuse ground-glass opacity with an apical predominance, similar in comparison to prior study. Findings likely reflect smoking related lung disease/respiratory bronchiolitis. New mild compression fracture deformity of L1 since 10/ 2021   01/2020 LDCT - stable, T7 fracture     10/2016 CT chest showed subcentimeter nodules which have been stable since 2017, right adrenal nodule was stable since 2015 and considered benign  Review of Systems neg for any significant sore throat, dysphagia, itching, sneezing, nasal congestion or excess/ purulent secretions, fever, chills, sweats, unintended  wt loss, pleuritic or exertional cp, hempoptysis, orthopnea pnd or change in chronic leg swelling. Also denies presyncope, palpitations, heartburn, abdominal pain, nausea, vomiting, diarrhea or change in bowel or urinary habits, dysuria,hematuria, rash, arthralgias, visual complaints, headache, numbness weakness or ataxia.     Objective:   Physical Exam  Gen.  Pleasant, obese, in no distress ENT - no lesions, no post nasal drip, steroid facies Neck: No JVD, no thyromegaly, no carotid bruits Lungs: no use of accessory muscles, no dullness to percussion, decreased without rales or rhonchi  Cardiovascular: Rhythm regular, heart sounds  normal, no murmurs or gallops, no peripheral edema Musculoskeletal: No deformities, no cyanosis or clubbing , no tremors       Assessment & Plan:    COPD exacerbation chronic hypoxic/hypercarbic resp failure Recent hospitalization for COPD exacerbation complicated by hypoxic hypercarbic respiratory failure, likely due to discontinuation of Lasix, leading to fluid retention and breathing difficulties. Resuming Lasix has improved symptoms. - Resume Lasix to manage fluid retention - Consider resuming ohtuwayre nebulizer treatment if beneficial for breathing -she is end stage COPD & nearing hospice care  Urinary retention Urinary retention necessitated Foley catheter placement. Urologist indicated bladder muscle dysfunction with potential need for long-term catheterization. Constipation may contribute. She is considering a second opinion before surgical interventions. Urologist discussed potential urethral erosion with prolonged catheter use and alternative interventions, including suprapubic catheterization or bladder muscle stimulation device. - Continue Foley catheter management - Consult with urologist for further evaluation and management - Encourage use of laxatives such as Miralax and Dulcolax to address constipation - Plan for follow-up with urologist in three weeks  Constipation Constipation potentially contributing to urinary retention. Limited relief from Miralax and Dulcolax. Likely exacerbated by hydrocodone use for pain management. - Increase Dulcolax for stronger laxative effect - Continue Miralax as needed - Monitor bowel movements and adjust laxative regimen accordingly   Shingles Rash on right  buttock, consistent with shingles, characterized by blisters and pain. Allergic reaction to antibiotic occurred. Rash has dried, but pain persists, typical of postherpetic neuralgia. Antiviral therapy discussed to decrease prolonged pain risk. - Consider neurontin if symptoms persist

## 2023-06-18 ENCOUNTER — Other Ambulatory Visit: Payer: Self-pay | Admitting: Adult Health

## 2023-06-18 ENCOUNTER — Telehealth: Payer: Self-pay | Admitting: Pulmonary Disease

## 2023-06-18 DIAGNOSIS — J9611 Chronic respiratory failure with hypoxia: Secondary | ICD-10-CM

## 2023-06-18 DIAGNOSIS — J449 Chronic obstructive pulmonary disease, unspecified: Secondary | ICD-10-CM

## 2023-06-18 NOTE — Telephone Encounter (Signed)
 Patient would like to go under hospice care. Patient phone number is [phone].

## 2023-06-18 NOTE — Telephone Encounter (Signed)
 Okay for refill?

## 2023-06-18 NOTE — Telephone Encounter (Signed)
 Please advise on Hospice referral

## 2023-06-19 NOTE — Telephone Encounter (Signed)
 Order placed

## 2023-07-07 ENCOUNTER — Encounter: Payer: Self-pay | Admitting: Internal Medicine

## 2023-07-16 ENCOUNTER — Ambulatory Visit: Admitting: Physician Assistant

## 2023-07-16 ENCOUNTER — Ambulatory Visit (HOSPITAL_BASED_OUTPATIENT_CLINIC_OR_DEPARTMENT_OTHER): Payer: Medicare Other | Admitting: Pulmonary Disease

## 2023-07-27 ENCOUNTER — Ambulatory Visit: Admitting: Internal Medicine

## 2023-08-05 ENCOUNTER — Other Ambulatory Visit: Payer: Self-pay

## 2023-08-05 ENCOUNTER — Telehealth: Payer: Self-pay | Admitting: Pharmacy Technician

## 2023-08-05 ENCOUNTER — Other Ambulatory Visit (HOSPITAL_COMMUNITY): Payer: Self-pay

## 2023-08-05 MED ORDER — ROSUVASTATIN CALCIUM 20 MG PO TABS: 20.0000 mg | ORAL_TABLET | Freq: Every day | ORAL | 2 refills | Status: AC

## 2023-08-05 NOTE — Telephone Encounter (Signed)
 We received notification to do prior auth for rosuvastatin  but the prior authorization said no prior auth needed. I called [facility] and had them run the rx under [employer]. They had another insurance

## 2023-08-06 ENCOUNTER — Other Ambulatory Visit: Payer: Self-pay

## 2023-08-06 ENCOUNTER — Encounter (HOSPITAL_COMMUNITY): Payer: Self-pay

## 2023-08-06 ENCOUNTER — Emergency Department (HOSPITAL_COMMUNITY)

## 2023-08-06 ENCOUNTER — Inpatient Hospital Stay (HOSPITAL_COMMUNITY)
Admission: EM | Admit: 2023-08-06 | Discharge: 2023-08-14 | DRG: 189 | Disposition: A | Discharge status: Hospice | Attending: Internal Medicine | Admitting: Internal Medicine

## 2023-08-06 DIAGNOSIS — Z8719 Personal history of other diseases of the digestive system: Secondary | ICD-10-CM

## 2023-08-06 DIAGNOSIS — I2510 Atherosclerotic heart disease of native coronary artery without angina pectoris: Secondary | ICD-10-CM | POA: Diagnosis present

## 2023-08-06 DIAGNOSIS — I214 Non-ST elevation (NSTEMI) myocardial infarction: Secondary | ICD-10-CM

## 2023-08-06 DIAGNOSIS — R7989 Other specified abnormal findings of blood chemistry: Secondary | ICD-10-CM | POA: Diagnosis present

## 2023-08-06 DIAGNOSIS — Z9981 Dependence on supplemental oxygen: Secondary | ICD-10-CM

## 2023-08-06 DIAGNOSIS — Z87891 Personal history of nicotine dependence: Secondary | ICD-10-CM

## 2023-08-06 DIAGNOSIS — R296 Repeated falls: Secondary | ICD-10-CM | POA: Diagnosis present

## 2023-08-06 DIAGNOSIS — I5043 Acute on chronic combined systolic (congestive) and diastolic (congestive) heart failure: Secondary | ICD-10-CM | POA: Diagnosis present

## 2023-08-06 DIAGNOSIS — J9621 Acute and chronic respiratory failure with hypoxia: Principal | ICD-10-CM | POA: Diagnosis present

## 2023-08-06 DIAGNOSIS — N1832 Chronic kidney disease, stage 3b: Secondary | ICD-10-CM | POA: Diagnosis present

## 2023-08-06 DIAGNOSIS — Z604 Social exclusion and rejection: Secondary | ICD-10-CM | POA: Diagnosis present

## 2023-08-06 DIAGNOSIS — Z5986 Financial insecurity: Secondary | ICD-10-CM

## 2023-08-06 DIAGNOSIS — Z7982 Long term (current) use of aspirin: Secondary | ICD-10-CM

## 2023-08-06 DIAGNOSIS — Z833 Family history of diabetes mellitus: Secondary | ICD-10-CM

## 2023-08-06 DIAGNOSIS — Z87442 Personal history of urinary calculi: Secondary | ICD-10-CM

## 2023-08-06 DIAGNOSIS — Z91040 Latex allergy status: Secondary | ICD-10-CM

## 2023-08-06 DIAGNOSIS — I428 Other cardiomyopathies: Secondary | ICD-10-CM | POA: Diagnosis present

## 2023-08-06 DIAGNOSIS — I130 Hypertensive heart and chronic kidney disease with heart failure and stage 1 through stage 4 chronic kidney disease, or unspecified chronic kidney disease: Secondary | ICD-10-CM | POA: Diagnosis present

## 2023-08-06 DIAGNOSIS — Z7951 Long term (current) use of inhaled steroids: Secondary | ICD-10-CM

## 2023-08-06 DIAGNOSIS — Z91048 Other nonmedicinal substance allergy status: Secondary | ICD-10-CM

## 2023-08-06 DIAGNOSIS — E785 Hyperlipidemia, unspecified: Secondary | ICD-10-CM | POA: Diagnosis present

## 2023-08-06 DIAGNOSIS — Z7952 Long term (current) use of systemic steroids: Secondary | ICD-10-CM

## 2023-08-06 DIAGNOSIS — Z8249 Family history of ischemic heart disease and other diseases of the circulatory system: Secondary | ICD-10-CM

## 2023-08-06 DIAGNOSIS — Z888 Allergy status to other drugs, medicaments and biological substances status: Secondary | ICD-10-CM

## 2023-08-06 DIAGNOSIS — F410 Panic disorder [episodic paroxysmal anxiety] without agoraphobia: Secondary | ICD-10-CM | POA: Diagnosis present

## 2023-08-06 DIAGNOSIS — J441 Chronic obstructive pulmonary disease with (acute) exacerbation: Secondary | ICD-10-CM | POA: Diagnosis not present

## 2023-08-06 DIAGNOSIS — Z79899 Other long term (current) drug therapy: Secondary | ICD-10-CM

## 2023-08-06 DIAGNOSIS — Z66 Do not resuscitate: Secondary | ICD-10-CM | POA: Diagnosis present

## 2023-08-06 DIAGNOSIS — I10 Essential (primary) hypertension: Secondary | ICD-10-CM | POA: Diagnosis present

## 2023-08-06 DIAGNOSIS — Z9049 Acquired absence of other specified parts of digestive tract: Secondary | ICD-10-CM

## 2023-08-06 DIAGNOSIS — Z880 Allergy status to penicillin: Secondary | ICD-10-CM

## 2023-08-06 DIAGNOSIS — Z515 Encounter for palliative care: Secondary | ICD-10-CM

## 2023-08-06 DIAGNOSIS — K5909 Other constipation: Secondary | ICD-10-CM | POA: Diagnosis present

## 2023-08-06 DIAGNOSIS — N179 Acute kidney failure, unspecified: Secondary | ICD-10-CM | POA: Diagnosis present

## 2023-08-06 DIAGNOSIS — Z90710 Acquired absence of both cervix and uterus: Secondary | ICD-10-CM

## 2023-08-06 MED ORDER — ALBUTEROL SULFATE (2.5 MG/3ML) 0.083% IN NEBU
10.0000 mg/h | INHALATION_SOLUTION | RESPIRATORY_TRACT | Status: DC
  Administered 2023-08-07: 10 mg/h via RESPIRATORY_TRACT
  Filled 2023-08-06: qty 12

## 2023-08-06 NOTE — ED Triage Notes (Signed)
 PT arrived via EMS. Complaints of SOB the past 3 days, PT was on O2 at home sat was 92% on 4L. PT is distress and obvious wheezing. PT is alert and talking and states she is hurting all over. PT HR was 130 and O2 sat was 99% on 8L. PT was on cpap but they removed prior to arrival. PT is a DNR per paperwork from EMS.

## 2023-08-06 NOTE — ED Provider Notes (Signed)
 [place] EMERGENCY DEPARTMENT AT [facility] Provider Note   CSN: [id number] Arrival date & time: 08/06/23  2333     History  Chief Complaint  Patient presents with   Respiratory Distress    PT arrived via EMS. Complaints of SOB the past 3 days, PT was on O2 at home sat was 92% on 4L. PT is distress and obvious wheezing. PT is alert and talking and states she is hurting all over. PT HR was 130 and O2 sat was 99% on 8L. PT was on cpap but they removed prior to arrival. PT is a DNR per paperwork from EMS.     IVANA YUSUPOV is a 68 y.o. female.  Limited history secondary to respiratory distress.  Had a few days of worsening dyspnea.  She is not sure what it is but she got really tight tonight.  She is on 4 L baseline at home.  EMS was called.  She was on her home oxygen sat around 92%.  They increased her to 8 L for comfort and then did CPAP on the way.  Also given albuterol  and steroids and route patient states she hurts all over.  Denies any fever.  Has been coughing.        Home Medications Prior to Admission medications   Medication Sig Start Date End Date Taking? Authorizing Provider  albuterol  (VENTOLIN  HFA) 108 (90 Base) MCG/ACT inhaler INHALE 2 PUFFS INTO THE LUNGS EVERY 4 TO 6 HOURS AS NEEDED FOR SHORTNESS OF BREATH OR WHEEZING Patient taking differently: Inhale 2 puffs into the lungs every 3 (three) hours as needed for wheezing or shortness of breath. 02/02/23   Lind Repine, MD  ALPRAZolam  (XANAX ) 1 MG tablet TAKE 1 TABLET(1 MG) BY MOUTH THREE TIMES DAILY 06/18/23   Nafziger, Randel Buss, NP  aspirin  EC 81 MG EC tablet Take 1 tablet (81 mg total) by mouth daily. Patient taking differently: Take 81 mg by mouth every Monday, Wednesday, and Friday. 07/08/15   Samtani, Jai-Gurmukh, MD  bisoprolol  (ZEBETA ) 5 MG tablet Take 0.5 tablets (2.5 mg total) by mouth daily. 06/03/23   Swinyer, Leilani Punter, NP  budesonide -formoterol  (SYMBICORT ) 160-4.5 MCG/ACT inhaler Inhale 2 puffs  into the lungs 2 (two) times daily. 10/15/22 05/12/23  Maggie Schooner, MD  butalbital -acetaminophen -caffeine  (FIORICET ) 50-325-40 MG tablet Take 1 tablet by mouth every 4 (four) hours as needed for headache. TAKE 1 TABLET BY MOUTH EVERY 4 HOURS AS NEEDED FOR HEADACHETAKE 1 TABLET BY MOUTH EVERY 4 HOURS AS NEEDED FOR HEADACHE Patient taking differently: Take 1 tablet by mouth every 4 (four) hours as needed for headache. 09/24/22   Alto Atta, NP  cetirizine (ZYRTEC) 10 MG tablet Take 10 mg by mouth daily.    [provider]  Cholecalciferol  (VITAMIN D3) 125 MCG (5000 UT) CAPS Take 5,000 Units by mouth daily.    [provider]  Cyanocobalamin  (VITAMIN B-12) 5000 MCG SUBL Place 5,000 mcg under the tongue daily.    [provider]  ezetimibe  (ZETIA ) 10 MG tablet TAKE 1 TABLET(10 MG) BY MOUTH DAILY Patient taking differently: Take 10 mg by mouth in the morning. 01/06/23   Swinyer, Leilani Punter, NP  furosemide  (LASIX ) 40 MG tablet Take 2 tablets (80 mg total) by mouth daily. Patient taking differently: Take 80 mg by mouth at bedtime. 04/23/23   Swinyer, Leilani Punter, NP  HYDROcodone -acetaminophen  (NORCO) 7.5-325 MG tablet Take 1 tablet by mouth every 6 (six) hours as needed for moderate pain (pain  score 4-6). 06/09/23   Nafziger, Randel Buss, NP  HYDROcodone -acetaminophen  (NORCO) 7.5-325 MG tablet Take 1 tablet by mouth every 6 (six) hours as needed for moderate pain (pain score 4-6). 06/09/23   Nafziger, Randel Buss, NP  HYDROcodone -acetaminophen  (NORCO) 7.5-325 MG tablet Take 1 tablet by mouth every 6 (six) hours as needed for moderate pain (pain score 4-6). 06/09/23   Nafziger, Randel Buss, NP  ipratropium-albuterol  (DUONEB) 0.5-2.5 (3) MG/3ML SOLN Take 3 mLs by nebulization every 4 (four) hours as needed. Patient taking differently: Take 3 mLs by nebulization every 2 (two) hours as needed (for shortness of breath or wheezing). 10/15/22 05/12/23  Amin, Ankit C, MD  magnesium  oxide (MAG-OX) 400 MG tablet Take 400  mg by mouth daily.    [provider]  MUCINEX  MAXIMUM STRENGTH 1200 MG TB12 Take 1,200 mg by mouth every 12 (twelve) hours.    [provider]  nitroGLYCERIN  (NITROSTAT ) 0.4 MG SL tablet Place 1 tablet (0.4 mg total) under the tongue every 5 (five) minutes as needed for chest pain. 10/24/20   Odie Benne, MD  nystatin  (MYCOSTATIN ) 100000 UNIT/ML suspension Take 5 mLs (500,000 Units total) by mouth 4 (four) times daily. Patient taking differently: Take 5 mLs by mouth 4 (four) times daily as needed (for thrush). 11/04/22   Nafziger, Randel Buss, NP  OHTUVAYRE  3 MG/2.5ML SUSP Take 3 mg by nebulization in the morning and at bedtime. Patient not taking: Reported on 06/16/2023    [provider]  OXYGEN Inhale 2.5-3.5 L/min into the lungs continuous.    [provider]  PARoxetine  (PAXIL ) 40 MG tablet Take 0.5 tablets (20 mg total) by mouth in the morning. TAKE 1 TABLET(40 MG) BY MOUTH EVERY MORNING Patient taking differently: Take 40 mg by mouth in the morning. 04/30/23   Nafziger, Randel Buss, NP  predniSONE  (DELTASONE ) 10 MG tablet 4 tabs daily for 3 days then 3 tabs daily for 3 days then 2 tabs daily for 3 days then 1 tab daily 05/18/23   Wouk, Haynes Lips, MD  rosuvastatin  (CRESTOR ) 20 MG tablet Take 1 tablet (20 mg total) by mouth daily. 08/05/23   Odie Benne, MD  SENOKOT 8.6 MG tablet Take 1 tablet by mouth 2 (two) times daily as needed for constipation.    [provider]  spironolactone  (ALDACTONE ) 25 MG tablet TAKE 1/2 TABLET(12.5 MG) BY MOUTH DAILY Patient taking differently: Take 12.5 mg by mouth in the morning. 12/30/22   Odie Benne, MD      Allergies    Tape, Daliresp  [roflumilast ], Cozaar  Carola.Caroli ], Dupixent  [dupilumab ], Latex, Penicillins, Statins, Torsemide , Zestril  [lisinopril ], and Omnicef  [cefdinir ]    Review of Systems   Review of Systems  Physical Exam Updated Vital Signs BP (!) 140/91 (BP Location: Right Arm)    Pulse (!) 134   Temp 99.1 F (37.3 C)   Ht 5\' 3"  (1.6 m)   Wt 68.5 kg   SpO2 98%   BMI 26.75 kg/m  Physical Exam Vitals and nursing note reviewed.  Constitutional:      General: She is in acute distress.     Appearance: She is well-developed. She is ill-appearing and diaphoretic.  HENT:     Head: Normocephalic and atraumatic.  Cardiovascular:     Rate and Rhythm: Normal rate and regular rhythm.  Pulmonary:     Effort: Respiratory distress present.     Breath sounds: No stridor. Wheezing present.  Abdominal:     General: There is no distension.  Musculoskeletal:  Cervical back: Normal range of motion.  Neurological:     General: No focal deficit present.     Mental Status: She is alert.     ED Results / Procedures / Treatments   Labs (all labs ordered are listed, but only abnormal results are displayed) Labs Reviewed  CBC WITH DIFFERENTIAL/PLATELET  COMPREHENSIVE METABOLIC PANEL WITH GFR  BRAIN NATRIURETIC PEPTIDE  TROPONIN I (HIGH SENSITIVITY)    EKG EKG Interpretation Date/Time:  Thursday Aug 06 2023 23:42:15 EDT Ventricular Rate:  133 PR Interval:  144 QRS Duration:  107 QT Interval:  298 QTC Calculation: 444 R Axis:   95  Text Interpretation: Sinus tachycardia Lateral infarct, old Confirmed by Eve Hinders [phone]) on 08/06/2023 11:44:22 PM  Radiology No results found.  Procedures .Critical Care  Performed by: Eve Hinders, MD Authorized by: Eve Hinders, MD   Critical care provider statement:    Critical care time (minutes):  30   Critical care was necessary to treat or prevent imminent or life-threatening deterioration of the following conditions:  Respiratory failure   Critical care was time spent personally by me on the following activities:  Development of treatment plan with patient or surrogate, discussions with consultants, evaluation of patient's response to treatment, examination of patient, ordering and review of laboratory studies,  ordering and review of radiographic studies, ordering and performing treatments and interventions, pulse oximetry, re-evaluation of patient's condition and review of old charts     Medications Ordered in ED Medications  albuterol  (PROVENTIL ) (2.5 MG/3ML) 0.083% nebulizer solution (has no administration in time range)    ED Course/ Medical Decision Making/ A&P                                 Medical Decision Making Amount and/or Complexity of Data Reviewed Labs: ordered. Radiology: ordered. ECG/medicine tests: ordered.  Risk Prescription drug management. Decision regarding hospitalization.  Patient respiratory distress.  Will continue BiPAP.  Will continue continuous albuterol  for the time being.  Get chest x-ray basic labs.  Already had steroids.  Will consider antibiotics depending on response to treatment and what her x-ray looks like.  As she is tachycardic and slightly hypertensive we will get a BNP and troponin as well to ensure this is an acute heart failure.  Also consider PE and may need a scan depending on response to treatment. Trponins rising, heparin  started. BNP also elevated.   D/w Dr. Gaylyn Keas with cardiology who noted multiple previous admissions/episodes with similar pattern, most recently in June where they elected to not cath her as she has had a couple clean caths in the last few years. Agreed with continuing heparin . She will make day team aware that she is here so they can see her but did not see any indication for cath tonight or emergent consult tonight.   Will d/w [facility] for admit.    Final Clinical Impression(s) / ED Diagnoses Final diagnoses:  COPD exacerbation (HCC)  NSTEMI (non-ST elevated myocardial infarction) [facility])    Rx / DC Orders ED Discharge Orders     None         Kinjal Neitzke, Reymundo Caulk, MD 08/07/23 2345

## 2023-08-07 ENCOUNTER — Emergency Department (HOSPITAL_COMMUNITY)

## 2023-08-07 ENCOUNTER — Inpatient Hospital Stay (HOSPITAL_COMMUNITY)

## 2023-08-07 ENCOUNTER — Encounter (HOSPITAL_COMMUNITY): Payer: Self-pay | Admitting: Family Medicine

## 2023-08-07 DIAGNOSIS — I10 Essential (primary) hypertension: Secondary | ICD-10-CM | POA: Diagnosis not present

## 2023-08-07 DIAGNOSIS — I5043 Acute on chronic combined systolic (congestive) and diastolic (congestive) heart failure: Secondary | ICD-10-CM

## 2023-08-07 DIAGNOSIS — N179 Acute kidney failure, unspecified: Secondary | ICD-10-CM

## 2023-08-07 DIAGNOSIS — E785 Hyperlipidemia, unspecified: Secondary | ICD-10-CM | POA: Diagnosis present

## 2023-08-07 DIAGNOSIS — K5909 Other constipation: Secondary | ICD-10-CM | POA: Diagnosis present

## 2023-08-07 DIAGNOSIS — I5021 Acute systolic (congestive) heart failure: Secondary | ICD-10-CM | POA: Diagnosis not present

## 2023-08-07 DIAGNOSIS — N1832 Chronic kidney disease, stage 3b: Secondary | ICD-10-CM | POA: Diagnosis present

## 2023-08-07 DIAGNOSIS — J441 Chronic obstructive pulmonary disease with (acute) exacerbation: Secondary | ICD-10-CM

## 2023-08-07 DIAGNOSIS — Z880 Allergy status to penicillin: Secondary | ICD-10-CM | POA: Diagnosis not present

## 2023-08-07 DIAGNOSIS — Z9981 Dependence on supplemental oxygen: Secondary | ICD-10-CM | POA: Diagnosis not present

## 2023-08-07 DIAGNOSIS — Z833 Family history of diabetes mellitus: Secondary | ICD-10-CM | POA: Diagnosis not present

## 2023-08-07 DIAGNOSIS — Z9049 Acquired absence of other specified parts of digestive tract: Secondary | ICD-10-CM | POA: Diagnosis not present

## 2023-08-07 DIAGNOSIS — I130 Hypertensive heart and chronic kidney disease with heart failure and stage 1 through stage 4 chronic kidney disease, or unspecified chronic kidney disease: Secondary | ICD-10-CM | POA: Diagnosis present

## 2023-08-07 DIAGNOSIS — Z66 Do not resuscitate: Secondary | ICD-10-CM | POA: Diagnosis present

## 2023-08-07 DIAGNOSIS — Z5986 Financial insecurity: Secondary | ICD-10-CM | POA: Diagnosis not present

## 2023-08-07 DIAGNOSIS — Z604 Social exclusion and rejection: Secondary | ICD-10-CM | POA: Diagnosis present

## 2023-08-07 DIAGNOSIS — R7989 Other specified abnormal findings of blood chemistry: Secondary | ICD-10-CM | POA: Diagnosis not present

## 2023-08-07 DIAGNOSIS — J9621 Acute and chronic respiratory failure with hypoxia: Secondary | ICD-10-CM | POA: Diagnosis present

## 2023-08-07 DIAGNOSIS — Z515 Encounter for palliative care: Secondary | ICD-10-CM | POA: Diagnosis not present

## 2023-08-07 DIAGNOSIS — I428 Other cardiomyopathies: Secondary | ICD-10-CM | POA: Diagnosis present

## 2023-08-07 DIAGNOSIS — Z7982 Long term (current) use of aspirin: Secondary | ICD-10-CM | POA: Diagnosis not present

## 2023-08-07 DIAGNOSIS — F410 Panic disorder [episodic paroxysmal anxiety] without agoraphobia: Secondary | ICD-10-CM | POA: Diagnosis present

## 2023-08-07 DIAGNOSIS — Z7189 Other specified counseling: Secondary | ICD-10-CM | POA: Diagnosis not present

## 2023-08-07 DIAGNOSIS — I2510 Atherosclerotic heart disease of native coronary artery without angina pectoris: Secondary | ICD-10-CM | POA: Diagnosis present

## 2023-08-07 DIAGNOSIS — Z8249 Family history of ischemic heart disease and other diseases of the circulatory system: Secondary | ICD-10-CM | POA: Diagnosis not present

## 2023-08-07 DIAGNOSIS — Z7951 Long term (current) use of inhaled steroids: Secondary | ICD-10-CM | POA: Diagnosis not present

## 2023-08-07 DIAGNOSIS — I429 Cardiomyopathy, unspecified: Secondary | ICD-10-CM | POA: Diagnosis not present

## 2023-08-07 DIAGNOSIS — R296 Repeated falls: Secondary | ICD-10-CM | POA: Diagnosis present

## 2023-08-07 DIAGNOSIS — Z87891 Personal history of nicotine dependence: Secondary | ICD-10-CM | POA: Diagnosis not present

## 2023-08-07 LAB — COMPREHENSIVE METABOLIC PANEL WITH GFR
ALT: 20 U/L (ref 0–44)
AST: 33 U/L (ref 15–41)
Albumin: 3.7 g/dL (ref 3.5–5.0)
Alkaline Phosphatase: 50 U/L (ref 38–126)
Anion gap: 18 — ABNORMAL HIGH (ref 5–15)
BUN: 28 mg/dL — ABNORMAL HIGH (ref 8–23)
CO2: 27 mmol/L (ref 22–32)
Calcium: 9.6 mg/dL (ref 8.9–10.3)
Chloride: 100 mmol/L (ref 98–111)
Creatinine, Ser: 2.02 mg/dL — ABNORMAL HIGH (ref 0.44–1.00)
GFR, Estimated: 26 mL/min — ABNORMAL LOW (ref >60)
Glucose, Bld: 170 mg/dL — ABNORMAL HIGH (ref 70–99)
Potassium: 3.6 mmol/L (ref 3.5–5.1)
Sodium: 145 mmol/L (ref 135–145)
Total Bilirubin: 0.4 mg/dL (ref 0.0–1.2)
Total Protein: 6.7 g/dL (ref 6.5–8.1)

## 2023-08-07 LAB — ECHOCARDIOGRAM COMPLETE
AV Mean grad: 3 mmHg
AV Peak grad: 7.2 mmHg
Ao pk vel: 1.34 m/s
Height: 63 in
Weight: 2416 [oz_av]

## 2023-08-07 LAB — URINALYSIS, COMPLETE (UACMP) WITH MICROSCOPIC
Bilirubin Urine: NEGATIVE
Glucose, UA: NEGATIVE mg/dL
Ketones, ur: NEGATIVE mg/dL
Nitrite: NEGATIVE
Protein, ur: NEGATIVE mg/dL
Specific Gravity, Urine: 1.011 (ref 1.005–1.030)
pH: 5 (ref 5.0–8.0)

## 2023-08-07 LAB — TROPONIN I (HIGH SENSITIVITY)
Troponin I (High Sensitivity): 1475 ng/L (ref <18)
Troponin I (High Sensitivity): 1654 ng/L (ref <18)

## 2023-08-07 LAB — CBC WITH DIFFERENTIAL/PLATELET
Abs Immature Granulocytes: 0 10*3/uL (ref 0.00–0.07)
Basophils Absolute: 0 10*3/uL (ref 0.0–0.1)
Basophils Relative: 0 %
Eosinophils Absolute: 0 10*3/uL (ref 0.0–0.5)
Eosinophils Relative: 0 %
HCT: 40.6 % (ref 36.0–46.0)
Hemoglobin: 12.5 g/dL (ref 12.0–15.0)
Lymphocytes Relative: 19 %
Lymphs Abs: 4.6 10*3/uL — ABNORMAL HIGH (ref 0.7–4.0)
MCH: 31.1 pg (ref 26.0–34.0)
MCHC: 30.8 g/dL (ref 30.0–36.0)
MCV: 101 fL — ABNORMAL HIGH (ref 80.0–100.0)
Monocytes Absolute: 1.7 10*3/uL — ABNORMAL HIGH (ref 0.1–1.0)
Monocytes Relative: 7 %
Neutro Abs: 18 10*3/uL — ABNORMAL HIGH (ref 1.7–7.7)
Neutrophils Relative %: 74 %
Platelets: 389 10*3/uL (ref 150–400)
RBC: 4.02 MIL/uL (ref 3.87–5.11)
RDW: 14.5 % (ref 11.5–15.5)
WBC: 24.3 10*3/uL — ABNORMAL HIGH (ref 4.0–10.5)
nRBC: 0 % (ref 0.0–0.2)
nRBC: 0 /100{WBCs}

## 2023-08-07 LAB — BRAIN NATRIURETIC PEPTIDE: B Natriuretic Peptide: 1057 pg/mL — ABNORMAL HIGH (ref 0.0–100.0)

## 2023-08-07 LAB — CREATININE, URINE, RANDOM: Creatinine, Urine: 75 mg/dL

## 2023-08-07 LAB — SODIUM, URINE, RANDOM: Sodium, Ur: 81 mmol/L

## 2023-08-07 MED ORDER — BUDESONIDE 0.25 MG/2ML IN SUSP
0.2500 mg | Freq: Two times a day (BID) | RESPIRATORY_TRACT | Status: DC
  Administered 2023-08-07 – 2023-08-13 (×14): 0.25 mg via RESPIRATORY_TRACT
  Filled 2023-08-07 (×15): qty 2

## 2023-08-07 MED ORDER — HEPARIN (PORCINE) 25000 UT/250ML-% IV SOLN
800.0000 [IU]/h | INTRAVENOUS | Status: DC
  Administered 2023-08-07: 800 [IU]/h via INTRAVENOUS
  Filled 2023-08-07: qty 250

## 2023-08-07 MED ORDER — ALPRAZOLAM 0.25 MG PO TABS
1.0000 mg | ORAL_TABLET | Freq: Three times a day (TID) | ORAL | Status: DC | PRN
  Filled 2023-08-07: qty 4

## 2023-08-07 MED ORDER — IPRATROPIUM-ALBUTEROL 0.5-2.5 (3) MG/3ML IN SOLN
3.0000 mL | Freq: Four times a day (QID) | RESPIRATORY_TRACT | Status: DC
  Administered 2023-08-07 – 2023-08-08 (×6): 3 mL via RESPIRATORY_TRACT
  Filled 2023-08-07 (×6): qty 3

## 2023-08-07 MED ORDER — OXYCODONE HCL 5 MG PO TABS
5.0000 mg | ORAL_TABLET | ORAL | Status: DC | PRN
  Administered 2023-08-07 – 2023-08-13 (×11): 5 mg via ORAL
  Filled 2023-08-07 (×11): qty 1

## 2023-08-07 MED ORDER — ONDANSETRON HCL 4 MG PO TABS: 4.0000 mg | ORAL_TABLET | Freq: Four times a day (QID) | ORAL | Status: DC | PRN

## 2023-08-07 MED ORDER — BISOPROLOL FUMARATE 5 MG PO TABS
2.5000 mg | ORAL_TABLET | Freq: Every day | ORAL | Status: DC
  Filled 2023-08-07: qty 0.5

## 2023-08-07 MED ORDER — ALPRAZOLAM 0.5 MG PO TABS
1.0000 mg | ORAL_TABLET | ORAL | Status: DC | PRN
  Administered 2023-08-08 – 2023-08-14 (×13): 1 mg via ORAL
  Filled 2023-08-07 (×13): qty 2

## 2023-08-07 MED ORDER — FUROSEMIDE 10 MG/ML IJ SOLN: 60.0000 mg | Freq: Two times a day (BID) | INTRAMUSCULAR | Status: DC

## 2023-08-07 MED ORDER — MAGNESIUM SULFATE 2 GM/50ML IV SOLN
2.0000 g | Freq: Once | INTRAVENOUS | Status: AC
  Administered 2023-08-07: 2 g via INTRAVENOUS
  Filled 2023-08-07: qty 50

## 2023-08-07 MED ORDER — FUROSEMIDE 10 MG/ML IJ SOLN
40.0000 mg | Freq: Once | INTRAMUSCULAR | Status: AC
  Administered 2023-08-07: 40 mg via INTRAVENOUS
  Filled 2023-08-07: qty 4

## 2023-08-07 MED ORDER — ASPIRIN 81 MG PO TBEC
81.0000 mg | DELAYED_RELEASE_TABLET | ORAL | Status: DC
  Administered 2023-08-10 – 2023-08-14 (×3): 81 mg via ORAL
  Filled 2023-08-07 (×3): qty 1

## 2023-08-07 MED ORDER — HEPARIN SODIUM (PORCINE) 5000 UNIT/ML IJ SOLN
5000.0000 [IU] | Freq: Three times a day (TID) | INTRAMUSCULAR | Status: DC
Start: 2023-08-07 — End: 2023-08-14
  Administered 2023-08-08 – 2023-08-14 (×17): 5000 [IU] via SUBCUTANEOUS
  Filled 2023-08-07 (×19): qty 1

## 2023-08-07 MED ORDER — SENNA 8.6 MG PO TABS: 1.0000 | ORAL_TABLET | Freq: Every day | ORAL | Status: DC | PRN

## 2023-08-07 MED ORDER — DOXYCYCLINE HYCLATE 100 MG PO TABS
100.0000 mg | ORAL_TABLET | Freq: Two times a day (BID) | ORAL | Status: AC
  Administered 2023-08-07 – 2023-08-11 (×10): 100 mg via ORAL
  Filled 2023-08-07 (×10): qty 1

## 2023-08-07 MED ORDER — ACETAMINOPHEN 325 MG PO TABS
650.0000 mg | ORAL_TABLET | Freq: Four times a day (QID) | ORAL | Status: DC | PRN
  Administered 2023-08-07 – 2023-08-08 (×2): 650 mg via ORAL
  Filled 2023-08-07 (×2): qty 2

## 2023-08-07 MED ORDER — ALPRAZOLAM 0.25 MG PO TABS
1.0000 mg | ORAL_TABLET | Freq: Four times a day (QID) | ORAL | Status: DC | PRN
  Administered 2023-08-07 (×2): 1 mg via ORAL
  Filled 2023-08-07: qty 4

## 2023-08-07 MED ORDER — ROSUVASTATIN CALCIUM 20 MG PO TABS
20.0000 mg | ORAL_TABLET | Freq: Every day | ORAL | Status: DC
  Administered 2023-08-08 – 2023-08-14 (×7): 20 mg via ORAL
  Filled 2023-08-07 (×7): qty 1

## 2023-08-07 MED ORDER — ACETAMINOPHEN 650 MG RE SUPP: 650.0000 mg | Freq: Four times a day (QID) | RECTAL | Status: DC | PRN

## 2023-08-07 MED ORDER — ARFORMOTEROL TARTRATE 15 MCG/2ML IN NEBU
15.0000 ug | INHALATION_SOLUTION | Freq: Two times a day (BID) | RESPIRATORY_TRACT | Status: DC
  Administered 2023-08-07 – 2023-08-13 (×13): 15 ug via RESPIRATORY_TRACT
  Filled 2023-08-07 (×14): qty 2

## 2023-08-07 MED ORDER — ALBUTEROL SULFATE (2.5 MG/3ML) 0.083% IN NEBU
2.5000 mg | INHALATION_SOLUTION | RESPIRATORY_TRACT | Status: DC | PRN
  Administered 2023-08-07: 2.5 mg via RESPIRATORY_TRACT
  Filled 2023-08-07: qty 3

## 2023-08-07 MED ORDER — METHYLPREDNISOLONE SODIUM SUCC 125 MG IJ SOLR
125.0000 mg | Freq: Two times a day (BID) | INTRAMUSCULAR | Status: DC
  Administered 2023-08-07 – 2023-08-09 (×6): 125 mg via INTRAVENOUS
  Filled 2023-08-07 (×6): qty 2

## 2023-08-07 MED ORDER — HEPARIN BOLUS VIA INFUSION
3000.0000 [IU] | Freq: Once | INTRAVENOUS | Status: AC
  Administered 2023-08-07: 3000 [IU] via INTRAVENOUS
  Filled 2023-08-07: qty 3000

## 2023-08-07 MED ORDER — SODIUM CHLORIDE 0.9% FLUSH
3.0000 mL | Freq: Two times a day (BID) | INTRAVENOUS | Status: DC
  Administered 2023-08-08 – 2023-08-14 (×14): 3 mL via INTRAVENOUS

## 2023-08-07 MED ORDER — PERFLUTREN LIPID MICROSPHERE
1.0000 mL | INTRAVENOUS | Status: AC | PRN
  Administered 2023-08-07: 2 mL via INTRAVENOUS

## 2023-08-07 MED ORDER — ONDANSETRON HCL 4 MG/2ML IJ SOLN: 4.0000 mg | Freq: Four times a day (QID) | INTRAMUSCULAR | Status: DC | PRN

## 2023-08-07 MED ORDER — FENTANYL CITRATE PF 50 MCG/ML IJ SOSY: 12.5000 ug | PREFILLED_SYRINGE | INTRAMUSCULAR | Status: DC | PRN

## 2023-08-07 MED ORDER — PAROXETINE HCL 20 MG PO TABS
40.0000 mg | ORAL_TABLET | Freq: Every day | ORAL | Status: DC
  Administered 2023-08-08 – 2023-08-14 (×7): 40 mg via ORAL
  Filled 2023-08-07 (×8): qty 2

## 2023-08-07 NOTE — Progress Notes (Signed)
*  PRELIMINARY RESULTS* Echocardiogram 2D Echocardiogram has been performed.  Kim [name] 08/07/2023, 8:58 AM

## 2023-08-07 NOTE — Progress Notes (Signed)
 RT took pt off bipap per MD and placed pt on 4L. Pt seems to be in no distress at this time. RN made aware.

## 2023-08-07 NOTE — ED Notes (Addendum)
 Pt began having a panic attack around 0815.  She was also wheezing pretty badly and very tachypneic.  Pt was given Xanax , Oxycodone , Solumedrol and placed back on Bipap.  During this time she also lost both of her IV's. I have started 2 new ones to support heparin  and mag as well as other meds or blood draws.

## 2023-08-07 NOTE — Progress Notes (Addendum)
 PHARMACY - ANTICOAGULATION CONSULT NOTE  Pharmacy Consult for heparin  Indication: r/o ACS  Allergies  Allergen Reactions   Tape Other (See Comments)    Skin tears easily, peels skin off   Daliresp  [Roflumilast ] Other (See Comments)    Skin peeling   Cozaar  [Losartan ] Other (See Comments)    Possible cause of severe hypotension   Dupixent  [Dupilumab ] Diarrhea, Swelling, Other (See Comments) and Cough    Facial swelling Headache   Latex Other (See Comments)    Peels skin off   Penicillins Hives   Statins Other (See Comments)    Myalgia    Torsemide  Other (See Comments)    Severe stomach cramps and all over   Zestril  [Lisinopril ] Cough   Omnicef  [Cefdinir ] Rash    Patient Measurements: Height: 5\' 3"  (160 cm) Weight: 68.5 kg (151 lb) IBW/kg (Calculated) : 52.4 HEPARIN  DW (KG): 66.4  Vital Signs: Temp: 99.1 F (37.3 C) (05/01 2341) BP: 121/86 (05/02 0000) Pulse Rate: 122 (05/02 0000)  Labs: Recent Labs    08/06/23 2342  HGB 12.5  HCT 40.6  PLT 389  CREATININE 2.02*  TROPONINIHS 1,475*    Estimated Creatinine Clearance: 24.4 mL/min (A) (by C-G formula based on SCr of 2.02 mg/dL (H)).   Medical History: 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/2008   OSTEOARTHRITIS 06/05/2009   PARESTHESIA 08/24/2007   TOBACCO ABUSE 01/25/2010    Assessment: 68yo female c/o SOB x3d, O2 at 92% on baseline 4L, during w/u troponin found to be elevated >> to begin heparin .  Goal of Therapy:  Heparin  level 0.3-0.7 units/ml Monitor platelets by anticoagulation protocol: Yes   Plan:  Heparin  3000 units IV bolus followed by infusion at 800 units/hr. Monitor heparin  levels and CBC.  Lonnie Roberts, PharmD, BCPS  08/07/2023,1:20 AM

## 2023-08-07 NOTE — Progress Notes (Signed)
 PROGRESS NOTE    Kim [name]  VHQ:[id number] DOB: January 13, 1956 DOA: 08/06/2023 PCP: Alto Atta, NP   Brief Narrative: 68 year old with past medical history significant for steroid-dependent COPD, chronic hypoxic respiratory failure, chronic combined systolic and diastolic heart failure, Takotsubo cardiomyopathy, anxiety, CKD 3B who presents with respiratory distress.  Reports 2 or 3 days of worsening of her chronic dyspnea with increased cough and sputum production.  Patient was found respiratory distress by EMS, satting 92% on 4 L.  On arrival to the ED she was afebrile oxygen sat 90% on 5 L, tachypnea tachycardia creatinine 2.0 troponin 1000, BNP 1057.  Cardiology was consulted, patient was started on IV heparin    Assessment & Plan:   Principal Problem:   COPD with acute exacerbation (HCC) Active Problems:   Elevated troponin   Essential hypertension   Acute renal failure superimposed on stage 3b chronic kidney disease (HCC)   Acute on chronic combined systolic and diastolic CHF (congestive heart failure) (HCC)   Acute on chronic respiratory failure with hypoxia (HCC)   1-Acute on chronic hypoxic respiratory failure COPD exacerbation - Continue BiPAP, patient with increased work of breathing on my evaluation - Start Brovana  and Pulmicort  - Start as needed albuterol  every 2 hours as needed - Continue doxycycline  - Continue DuoNeb - IV steroids Continue Xanax  every 4 hours as needed, patient has also component of anxiety - Patient does not wish to be intubated - palliative care consulted  Elevation of troponin: In setting of demand ischemia in the setting of COPD - Cardiology has been consulted, think related to demand ischemia in the setting of COPD, recommended discontinuation of IV heparin  - She was initially on heparin  drip  Chronic combined systolic and diastolic heart failure - Cardiology does not think that she is markedly over loaded.  Lasix  was discontinued.   Bisoprolol  changed as needed because she has a history of orthostatic hypotension  AKI on CKD 3B -Baseline Creatinine 1.3  Anxiety: Continue with Xanax  1 mg every 4 hours as needed   Estimated body mass index is 26.75 kg/m as calculated from the following:   Height as of this encounter: 5\' 3"  (1.6 m).   Weight as of this encounter: 68.5 kg.   DVT prophylaxis: Subcu heparin  Code Status: Patient DNR/DNI Family Communication: Care discussed with patient Disposition Plan:  Status is: Inpatient Remains inpatient appropriate because: Remain inpatient for COPD exacerbation    Consultants:  Palliative care Cardiology  Procedures:  none  Antimicrobials:    Subjective: Alert seen in the ED in acute respiratory distress having shortness of breath.  Nurse will place her on BiPAP.  Will receive extra breathing treatment now  Objective: Vitals:   08/07/23 0700 08/07/23 0704 08/07/23 0730 08/07/23 0745  BP: 111/75  116/70 (!) 110/99  Pulse: (!) 101  (!) 107 (!) 107  Resp: (!) 25  18   Temp:  98.6 F (37 C)    TempSrc:  Oral    SpO2: 94%  96% 96%  Weight:      Height:        Intake/Output Summary (Last 24 hours) at 08/07/2023 0818 Last data filed at 08/07/2023 0631 Gross per 24 hour  Intake 60 ml  Output 800 ml  Net -740 ml   Filed Weights   08/06/23 2343  Weight: 68.5 kg    Examination:  General exam: Appears calm and comfortable  Respiratory system: Tachypnea, increased work of breathing, bilateral wheezing Cardiovascular system: S1 & S2 heard, RRR.  No JVD, murmurs, rubs, gallops or clicks. No pedal edema. Gastrointestinal system: Abdomen is nondistended, soft and nontender. No organomegaly or masses felt. Normal bowel sounds heard. Central nervous system: Alert and oriented.  Extremities: Symmetric 5 x 5 power.    Data Reviewed: I have personally reviewed following labs and imaging studies  CBC: Recent Labs  Lab 08/06/23 2342  WBC 24.3*  NEUTROABS 18.0*   HGB 12.5  HCT 40.6  MCV 101.0*  PLT 389   Basic Metabolic Panel: Recent Labs  Lab 08/06/23 2342  NA 145  K 3.6  CL 100  CO2 27  GLUCOSE 170*  BUN 28*  CREATININE 2.02*  CALCIUM  9.6   GFR: Estimated Creatinine Clearance: 24.4 mL/min (A) (by C-G formula based on SCr of 2.02 mg/dL (H)). Liver Function Tests: Recent Labs  Lab 08/06/23 2342  AST 33  ALT 20  ALKPHOS 50  BILITOT 0.4  PROT 6.7  ALBUMIN 3.7   No results for input(s): "LIPASE", "AMYLASE" in the last 168 hours. No results for input(s): "AMMONIA" in the last 168 hours. Coagulation Profile: No results for input(s): "INR", "PROTIME" in the last 168 hours. Cardiac Enzymes: No results for input(s): "CKTOTAL", "CKMB", "CKMBINDEX", "TROPONINI" in the last 168 hours. BNP (last 3 results) Recent Labs    04/23/23 1547  PROBNP 337*   HbA1C: No results for input(s): "HGBA1C" in the last 72 hours. CBG: No results for input(s): "GLUCAP" in the last 168 hours. Lipid Profile: No results for input(s): "CHOL", "HDL", "LDLCALC", "TRIG", "CHOLHDL", "LDLDIRECT" in the last 72 hours. Thyroid  Function Tests: No results for input(s): "TSH", "T4TOTAL", "FREET4", "T3FREE", "THYROIDAB" in the last 72 hours. Anemia Panel: No results for input(s): "VITAMINB12", "FOLATE", "FERRITIN", "TIBC", "IRON", "RETICCTPCT" in the last 72 hours. Sepsis Labs: No results for input(s): "PROCALCITON", "LATICACIDVEN" in the last 168 hours.  No results found for this or any previous visit (from the past 240 hours).       Radiology Studies: CT Chest Wo Contrast Result Date: 08/07/2023 EXAM: CT CHEST WITHOUT CONTRAST 08/07/2023 03:43:59 AM TECHNIQUE: CT of the chest was performed without the administration of intravenous contrast. Multiplanar reformatted images are provided for review. Automated exposure control, iterative reconstruction, and/or weight based adjustment of the mA/kV was utilized to reduce the radiation dose to as low as  reasonably achievable. COMPARISON: Chest radiograph earlier today and CT chest dated 05/08/2022. CLINICAL HISTORY: Respiratory illness, nondiagnostic xray. Triage notes; PT arrived via EMS. Complaints of SOB the past 3 days, PT was on O2 at home sat was 92% on 4L. PT is distress and obvious wheezing. PT is alert and talking and states she is hurting all over. PT HR was 130 and O2 sat was 99% on 8L. PT was on cpap but they removed prior to arrival. PT is a DNR per paperwork from EMS. FINDINGS: MEDIASTINUM: Heart and pericardium are unremarkable. The central airways are clear. Mild 3-vessel coronary atherosclerosis. Thoracic aortic atherosclerosis. LYMPH NODES: No evidence of mediastinal, hilar or axillary lymphadenopathy. LUNGS AND PLEURA: Moderate centrilobular emphysematous changes, upper lung predominant. Mild scarring/atelectasis in the posterior left upper lobe. No focal consolidation or pulmonary edema. No evidence of pleural effusion or pneumothorax. SOFT TISSUES/BONES: Mild anterior wedging at T6-7, chronic. No acute abnormality of the bones or soft tissues. UPPER ABDOMEN: Status post cholecystectomy. Limited images of the upper abdomen demonstrates no acute abnormality. IMPRESSION: 1. No acute findings. Electronically signed by: Zadie Herter MD 08/07/2023 03:51 AM EDT RP Workstation: [id number]   DG Chest  Portable 1 View Result Date: 08/07/2023 CLINICAL DATA:  evaluate for sob EXAM: PORTABLE CHEST 1 VIEW COMPARISON:  Chest x-ray 05/12/2023 FINDINGS: The heart and mediastinal contours are unchanged. No focal consolidation. No pulmonary edema. No pleural effusion. No pneumothorax. No acute osseous abnormality. IMPRESSION: No active disease. Electronically Signed   By: Morgane  Naveau M.D.   On: 08/07/2023 00:05        Scheduled Meds:  aspirin  EC  81 mg Oral Q M,W,F   bisoprolol   2.5 mg Oral Daily   doxycycline   100 mg Oral Q12H   furosemide   60 mg Intravenous BID   ipratropium-albuterol   3 mL  Nebulization Q6H   methylPREDNISolone  (SOLU-MEDROL ) injection  125 mg Intravenous BID   PARoxetine   40 mg Oral Daily   rosuvastatin   20 mg Oral Daily   sodium chloride  flush  3 mL Intravenous Q12H   Continuous Infusions:  albuterol  10 mg/hr (08/07/23 0017)   heparin  800 Units/hr (08/07/23 0314)     LOS: 0 days    Time spent: 35 minutes    Dorothea Yow A Nonnie Pickney, MD [facility]   If 7PM-7AM, please contact night-coverage [email or web address]  08/07/2023, 8:18 AM

## 2023-08-07 NOTE — Consult Note (Addendum)
 As below, patient seen and examined.  Briefly she is a 68 year old female with history of stress-induced cardiomyopathy, vasovagal syncope, nonobstructive coronary disease, COPD on chronic home oxygen, hypertension, hyperlipidemia for evaluation of dyspnea and elevated troponin.  Patient has had stress-induced cardiomyopathy previously in 2017 and 2022.  Most recent catheterization August 2022 showed minimal plaquing in the proximal LAD but otherwise no significant stenosis.  She has significant COPD and is on 2-1/2 to 3 L of oxygen at home.  She has been treated for history of LV dysfunction but limited due to hypotension/orthostasis.  She has difficulty ambulating.  She developed worsening dyspnea/productive cough.  She also noted her heart rate was elevated and presented to the emergency room.  Her dyspnea has improved with breathing treatments.  Her troponin was mildly elevated and cardiology asked to evaluate.  She denies chest pain.  Chest CT shows no acute findings.  Creatinine 2.02, BNP 1057, troponin 1475 and 1654, white blood cell count 24.3.  Electrocardiogram shows sinus tachycardia with no diagnostic ST changes.  Echocardiogram shows ejection fraction 50 to 55% though technically difficult.  1 elevated troponin-likely secondary to demand ischemia in the setting of COPD flare/tachycardia.  She denies chest pain and there is no clear trend.  Electrocardiogram shows no diagnostic ST changes.  Echocardiogram shows preserved LV function (actually improved compared to previous).  Would not pursue further ischemia evaluation.  Discontinue IV heparin .  2 COPD flare-management per primary service.  I think this is the predominant issue.  3 history of minimal coronary artery disease-continue aspirin  and statin.  4 history of cardiomyopathy-LV function improved on most recent echocardiogram.  She is not on guideline directed medical therapy due to history of orthostasis with recurrent falls.  She takes  bisoprolol  only as needed at home which can be continued.  Will discontinue running dose at this point.  BNP is elevated but she is not markedly volume overloaded on examination.  Discontinue IV Lasix .  5 history of stress-induced cardiomyopathy-follow-up echocardiogram this admission shows ejection fraction 50 to 55%.  6 hyperlipidemia-continue statin.  7 no CODE BLUE  Alexandria Angel, MD      Cardiology Consultation   Patient ID: Kim [name] MRN: [id number]; DOB: 1955-10-23  Admit date: 08/06/2023 Date of Consult: 08/07/2023  PCP:  Alto Atta, NP   [facility] Providers Cardiologist:  Antoinette Batman, MD   {  Patient Profile:   Kim [name] is a 68 y.o. female with a hx of nonischemic cardiomyopathy with reduced EF, Takotsubo cardiomyopathy ,vasovagal syncope, mild nonobstructive CAD, end-stage COPD, hyperlipidemia, hypertension, CKD, who is being seen 08/07/2023 for the evaluation of elevated trops at the request of Dr. Brice Campi.  History of Present Illness:   Ms. Witzke has history of Takotsubo cardiomyopathy in 2017, 2022 in the setting of COPD exacerbations.  Each time cardiac catheterization has not demonstrated any significant CAD.  She has had somewhat labile EF that have fluctuated between preserved and reduced with EF 35 to 40% noted on last echocardiogram on 09/11/2022.  GDMT has been difficult to titrate due to history of vasovagal syncope and soft blood pressures.  She has been on bisoprolol  which was actually discontinued at her last visit in January 2025.  Also has been on low-dose spironolactone , Lasix  80 mg.  In regards to her COPD she has end-stage disease and is in hospice care.  She is a DNR.  She also has had very frequent hospitalizations in the last couple years due to this.  Currently patient being admitted for COPD exacerbation, had been noting increasing dyspnea, cough and sputum production for the last 2 to 3 days.  Cardiology asked to see  due to elevated troponins 1475, 1654 and started on IV heparin .  Respiratory status has been exacerbated by panic attack this morning so she has been placed back on BiPAP and still visibly uncomfortable, diaphoretic, tachypneic, tachycardic with heart rates in the 120s.  Patient has denied any complaints of chest pain.  She feels some chest tightness that she associates with her COPD which has been unchanged and reporting no other significant symptoms of peripheral edema, orthopnea.   Chest x-ray and CT negative.  Potassium 3.6.  Creatinine 2.02 up from baseline.  BNP 1000+.  Troponins as above.  WBC 24.3.  Hemoglobin 12.5.  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/2008   OSTEOARTHRITIS 06/05/2009   PARESTHESIA 08/24/2007   TOBACCO ABUSE 01/25/2010    Past Surgical History:  Procedure Laterality Date   ABDOMINAL HYSTERECTOMY     APPENDECTOMY  1969   CARDIAC CATHETERIZATION N/A 07/05/2015   Procedure: Left Heart Cath and Coronary Angiography;  Surgeon: Arleen Lacer, MD;  Location: [facility] INVASIVE CV LAB;  Service: Cardiovascular;  Laterality: N/A;   CHOLECYSTECTOMY  1989   collapse lung  1990   COLONOSCOPY WITH PROPOFOL  N/A 05/14/2018   Procedure: COLONOSCOPY WITH PROPOFOL ;  Surgeon: Tobin Forts, MD;  Location: [facility] ENDOSCOPY;  Service: Endoscopy;  Laterality: N/A;   EXCISION MASS ABDOMINAL  2019   EXPLORATORY LAPAROTOMY  1969   LEFT HEART CATH AND CORONARY ANGIOGRAPHY N/A 11/19/2020   Procedure: LEFT HEART CATH AND CORONARY ANGIOGRAPHY;  Surgeon: Sammy Crisp, MD;  Location: [facility] INVASIVE CV LAB;  Service: Cardiovascular;  Laterality: N/A;   POLYPECTOMY  05/14/2018   Procedure: POLYPECTOMY;  Surgeon: Tobin Forts, MD;  Location: [facility] ENDOSCOPY;  Service: Endoscopy;;   SALPINGOOPHORECTOMY   2019     Inpatient Medications: Scheduled Meds:  arformoterol   15 mcg Nebulization BID   aspirin  EC  81 mg Oral Q M,W,F   bisoprolol   2.5 mg Oral Daily   budesonide  (PULMICORT ) nebulizer solution  0.25 mg Nebulization BID   doxycycline   100 mg Oral Q12H   furosemide   60 mg Intravenous BID   ipratropium-albuterol   3 mL Nebulization Q6H   methylPREDNISolone  (SOLU-MEDROL ) injection  125 mg Intravenous BID   PARoxetine   40 mg Oral Daily   rosuvastatin   20 mg Oral Daily   sodium chloride  flush  3 mL Intravenous Q12H   Continuous Infusions:  albuterol  10 mg/hr (08/07/23 0017)   heparin  800 Units/hr (08/07/23 0314)   magnesium  sulfate bolus IVPB     PRN Meds: acetaminophen  **OR** acetaminophen , albuterol , ALPRAZolam , fentaNYL  (SUBLIMAZE ) injection, ondansetron  **OR** ondansetron  (ZOFRAN ) IV, oxyCODONE , perflutren  lipid microspheres (DEFINITY ) IV suspension, senna  Allergies:    Allergies  Allergen Reactions   Tape Other (See Comments)    Skin tears easily, peels skin off   Daliresp  [Roflumilast ] Other (See Comments)    Skin peeling   Cozaar  [Losartan ] Other (See Comments)    Possible cause of severe hypotension   Dupixent  [Dupilumab ] Diarrhea, Swelling, Other (See Comments) and Cough    Facial swelling Headache   Latex Other (See Comments)    Peels skin off   Penicillins  Hives   Statins Other (See Comments)    Myalgia    Torsemide  Other (See Comments)    Severe stomach cramps and all over   Zestril  [Lisinopril ] Cough   Omnicef  [Cefdinir ] Rash    Social History:   Social History   Socioeconomic History   Marital status: Married    Spouse name: Charlann Confer   Number of children: 2   Years of education: Not on file   Highest education level: Not on file  Occupational History   Occupation: Patient care aide-sits with elderly lady  Tobacco Use   Smoking status: Former    Current packs/day: 0.00    Average packs/day: 2.0 packs/day for 38.0 years (76.0 ttl pk-yrs)     Types: Cigarettes    Start date: 03/07/1976    Quit date: 03/07/2014    Years since quitting: 9.4   Smokeless tobacco: Never  Vaping Use   Vaping status: Never Used  Substance and Sexual Activity   Alcohol  use: No    Alcohol /week: 0.0 standard drinks of alcohol    Drug use: No   Sexual activity: Not on file  Other Topics Concern   Not on file  Social History Narrative   [facility] (01/12/17):   Originally from [facility]. Has lived in [place] for 17 years. Married. Has 2 dogs currently. No mold and only 1 indoor plant. Had her home professionally cleaned a month ago. No bird or hot tub exposure. 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    Right handed    Social Drivers of Health   Financial Resource Strain: High Risk (09/10/2021)   Overall Financial Resource Strain (CARDIA)    Difficulty of Paying Living Expenses: Very hard  Food Insecurity: No Food Insecurity (05/17/2023)   Hunger Vital Sign    Worried About Running Out of Food in the Last Year: Never true    Ran Out of Food in the Last Year: Never true  Transportation Needs: No Transportation Needs (05/17/2023)   PRAPARE - Administrator, Civil Service (Medical): No    Lack of Transportation (Non-Medical): No  Physical Activity: Inactive (03/25/2021)   Exercise Vital Sign    Days of Exercise per Week: 0 days    Minutes of Exercise per Session: 0 min  Stress: No Stress Concern Present (09/10/2021)   [employer] of Occupational Health - Occupational Stress Questionnaire    Feeling of Stress : Not at all  Social Connections: Socially Isolated (05/13/2023)   Social Connection and Isolation Panel [NHANES]    Frequency of Communication with Friends and Family: Once a week    Frequency of Social Gatherings with Friends and Family: Once a week    Attends Religious Services: Never    Database administrator or Organizations: No    Attends Banker Meetings: Never    Marital Status: Married   Catering manager Violence: Not At Risk (05/17/2023)   Humiliation, Afraid, Rape, and Kick questionnaire    Fear of Current or Ex-Partner: No    Emotionally Abused: No    Physically Abused: No    Sexually Abused: No    Family History:   Family History  Problem Relation Age of Onset   Cancer Mother        lung   Heart attack Mother    Hypertension Sister    Multiple sclerosis Brother    Diabetes Sister    Rheum arthritis Sister    Thyroid  disease Sister  Multiple sclerosis Other    Alcohol  abuse Other    Arthritis Other    Diabetes Other    Kidney disease Other    Cancer Other        lung,ovarian,skin, uterine   Stroke Other    Heart disease Other    Melanoma Other    Osteoporosis Other    Heart attack Maternal Uncle    Heart attack Other        NEICE   Heart attack Other        NEPHEW   Hypertension Brother    Stroke Maternal Grandmother    Colon cancer Neg Hx      ROS:  Please see the history of present illness.  All other ROS reviewed and negative.     Physical Exam/Data:   Vitals:   08/07/23 0704 08/07/23 0730 08/07/23 0745 08/07/23 0851  BP:  116/70 (!) 110/99 137/83  Pulse:  (!) 107 (!) 107 (!) 125  Resp:  18  (!) 26  Temp: 98.6 F (37 C)     TempSrc: Oral     SpO2:  96% 96% 99%  Weight:      Height:        Intake/Output Summary (Last 24 hours) at 08/07/2023 0915 Last data filed at 08/07/2023 0631 Gross per 24 hour  Intake 60 ml  Output 800 ml  Net -740 ml      08/06/2023   11:43 PM 06/16/2023    2:08 PM 06/03/2023    1:13 PM  Last 3 Weights  Weight (lbs) 151 lb 159 lb 9.6 oz 159 lb  Weight (kg) 68.493 kg 72.394 kg 72.122 kg     Body mass index is 26.75 kg/m.  General: Tachypneic, respiratory failure, on BiPAP, diaphoretic HEENT: normal Neck: Difficult to assess JVD Vascular: No carotid bruits; Distal pulses 2+ bilaterally Cardiac: Distant heart sounds Lungs: Diffuse wheezes Abd: soft, nontender, no hepatomegaly  Ext: no  edema Musculoskeletal:  No deformities, BUE and BLE strength normal and equal Skin: warm and dry  Neuro:  CNs 2-12 intact, no focal abnormalities noted Psych:  Normal affect   EKG:  The EKG was personally reviewed and demonstrates: EKG appears overall sinus tachycardia with a heart rate of 133.  There is artifact throughout with wandering baseline.  However, no obvious acute ST-T wave changes. Telemetry:  Telemetry was personally reviewed and demonstrates: Sinus tachycardia heart rates around 120-100 30s right now.  Relevant CV Studies: Echocardiogram 08/07/2023 1. Extremely poor windows even with contrast, exam also limited by  tachycardia. EF appears at most mildly reduced. There is a septal wall  abnormality, but may be due to bundle branch block. Would recommend  repeating when tachycardia improved. Left  ventricular ejection fraction, by estimation, is 50 to 55%. The left  ventricle has low normal function. Left ventricular endocardial border not  optimally defined to evaluate regional wall motion. Left ventricular  diastolic function could not be  evaluated.   2. Right ventricular systolic function is normal. The right ventricular  size is normal.   3. The mitral valve is grossly normal. No evidence of mitral valve  regurgitation. No evidence of mitral stenosis.   4. The aortic valve is grossly normal. Aortic valve regurgitation is not  visualized. No aortic stenosis is present.   5. The inferior vena cava is normal in size with greater than 50%  respiratory variability, suggesting right atrial pressure of 3 mmHg.    Laboratory Data:  High Sensitivity Troponin:  Recent Labs  Lab 08/06/23 2342 08/07/23 0320  TROPONINIHS 1,475* 1,654*     Chemistry Recent Labs  Lab 08/06/23 2342  NA 145  K 3.6  CL 100  CO2 27  GLUCOSE 170*  BUN 28*  CREATININE 2.02*  CALCIUM  9.6  GFRNONAA 26*  ANIONGAP 18*    Recent Labs  Lab 08/06/23 2342  PROT 6.7  ALBUMIN 3.7  AST 33   ALT 20  ALKPHOS 50  BILITOT 0.4   Lipids No results for input(s): "CHOL", "TRIG", "HDL", "LABVLDL", "LDLCALC", "CHOLHDL" in the last 168 hours.  Hematology Recent Labs  Lab 08/06/23 2342  WBC 24.3*  RBC 4.02  HGB 12.5  HCT 40.6  MCV 101.0*  MCH 31.1  MCHC 30.8  RDW 14.5  PLT 389   Thyroid  No results for input(s): "TSH", "FREET4" in the last 168 hours.  BNP Recent Labs  Lab 08/06/23 2359  BNP 1,057.0*    DDimer No results for input(s): "DDIMER" in the last 168 hours.   Radiology/Studies:  ECHOCARDIOGRAM COMPLETE Result Date: 08/07/2023    ECHOCARDIOGRAM REPORT   Patient Name:   WRAY HIBMA Date of Exam: 08/07/2023 Medical Rec #:  [id number]       Height:       63.0 in Accession #:    [id number]      Weight:       151.0 lb Date of Birth:  11/30/1955      BSA:          1.716 m Patient Age:    67 years        BP:           137/83 mmHg Patient Gender: F               HR:           142 bpm. Exam Location:  Inpatient Procedure: 2D Echo, Cardiac Doppler and Color Doppler (Both Spectral and Color            Flow Doppler were utilized during procedure). Indications:    I50.21 Acute systolic (congestive) heart failure  History:        Patient has prior history of Echocardiogram examinations, most                 recent 09/11/2022. CHF, COPD; Risk Factors:Hypertension.  Sonographer:    Andrena Bang Referring Phys: [id number] TIMOTHY S OPYD  Sonographer Comments: Pt had panic attack during exam. IMPRESSIONS  1. Extremely poor windows even with contrast, exam also limited by tachycardia. EF appears at most mildly reduced. There is a septal wall abnormality, but may be due to bundle branch block. Would recommend repeating when tachycardia improved. Left ventricular ejection fraction, by estimation, is 50 to 55%. The left ventricle has low normal function. Left ventricular endocardial border not optimally defined to evaluate regional wall motion. Left ventricular diastolic function could not be evaluated.   2. Right ventricular systolic function is normal. The right ventricular size is normal.  3. The mitral valve is grossly normal. No evidence of mitral valve regurgitation. No evidence of mitral stenosis.  4. The aortic valve is grossly normal. Aortic valve regurgitation is not visualized. No aortic stenosis is present.  5. The inferior vena cava is normal in size with greater than 50% respiratory variability, suggesting right atrial pressure of 3 mmHg. FINDINGS  Left Ventricle: Extremely poor windows even with contrast, exam also limited by tachycardia. EF appears at most mildly reduced. There is a  septal wall abnormality, but may be due to bundle branch block. Would recommend repeating when tachycardia improved. Left ventricular ejection fraction, by estimation, is 50 to 55%. The left ventricle has low normal function. Left ventricular endocardial border not optimally defined to evaluate regional wall motion. Definity  contrast agent was given IV to delineate the left ventricular endocardial borders. The left ventricular internal cavity size was normal in size. There is no left ventricular hypertrophy. Left ventricular diastolic function could not be evaluated. Right Ventricle: The right ventricular size is normal. No increase in right ventricular wall thickness. Right ventricular systolic function is normal. Left Atrium: Left atrial size was normal in size. Right Atrium: Right atrial size was normal in size. Pericardium: There is no evidence of pericardial effusion. Mitral Valve: The mitral valve is grossly normal. No evidence of mitral valve regurgitation. No evidence of mitral valve stenosis. Tricuspid Valve: The tricuspid valve is normal in structure. Tricuspid valve regurgitation is not demonstrated. No evidence of tricuspid stenosis. Aortic Valve: The aortic valve is grossly normal. Aortic valve regurgitation is not visualized. No aortic stenosis is present. Aortic valve mean gradient measures 3.0 mmHg. Aortic  valve peak gradient measures 7.2 mmHg. Pulmonic Valve: The pulmonic valve was not well visualized. Pulmonic valve regurgitation is not visualized. No evidence of pulmonic stenosis. Aorta: The aortic root is normal in size and structure. Venous: The inferior vena cava is normal in size with greater than 50% respiratory variability, suggesting right atrial pressure of 3 mmHg. IAS/Shunts: No atrial level shunt detected by color flow Doppler.   Diastology LV e' lateral: 13.70 cm/s  RIGHT VENTRICLE RV S prime:     16.50 cm/s TAPSE (M-mode): 1.0 cm LEFT ATRIUM             Index LA Vol (A2C):   27.1 ml 15.79 ml/m LA Vol (A4C):   15.5 ml 9.03 ml/m LA Biplane Vol: 20.2 ml 11.77 ml/m  AORTIC VALVE AV Vmax:           134.00 cm/s AV Vmean:          80.200 cm/s AV VTI:            0.140 m AV Peak Grad:      7.2 mmHg AV Mean Grad:      3.0 mmHg LVOT Vmax:         99.00 cm/s LVOT Vmean:        54.800 cm/s LVOT VTI:          0.142 m LVOT/AV VTI ratio: 1.01  AORTA Ao Asc diam: 3.00 cm  SHUNTS Systemic VTI: 0.14 m Arta Lark Electronically signed by Arta Lark Signature Date/Time: 08/07/2023/9:00:21 AM    Final    CT Chest Wo Contrast Result Date: 08/07/2023 EXAM: CT CHEST WITHOUT CONTRAST 08/07/2023 03:43:59 AM TECHNIQUE: CT of the chest was performed without the administration of intravenous contrast. Multiplanar reformatted images are provided for review. Automated exposure control, iterative reconstruction, and/or weight based adjustment of the mA/kV was utilized to reduce the radiation dose to as low as reasonably achievable. COMPARISON: Chest radiograph earlier today and CT chest dated 05/08/2022. CLINICAL HISTORY: Respiratory illness, nondiagnostic xray. Triage notes; PT arrived via EMS. Complaints of SOB the past 3 days, PT was on O2 at home sat was 92% on 4L. PT is distress and obvious wheezing. PT is alert and talking and states she is hurting all over. PT HR was 130 and O2 sat was 99% on 8L. PT was on cpap but  they removed prior to arrival. PT is a DNR per paperwork from EMS. FINDINGS: MEDIASTINUM: Heart and pericardium are unremarkable. The central airways are clear. Mild 3-vessel coronary atherosclerosis. Thoracic aortic atherosclerosis. LYMPH NODES: No evidence of mediastinal, hilar or axillary lymphadenopathy. LUNGS AND PLEURA: Moderate centrilobular emphysematous changes, upper lung predominant. Mild scarring/atelectasis in the posterior left upper lobe. No focal consolidation or pulmonary edema. No evidence of pleural effusion or pneumothorax. SOFT TISSUES/BONES: Mild anterior wedging at T6-7, chronic. No acute abnormality of the bones or soft tissues. UPPER ABDOMEN: Status post cholecystectomy. Limited images of the upper abdomen demonstrates no acute abnormality. IMPRESSION: 1. No acute findings. Electronically signed by: Zadie Herter MD 08/07/2023 03:51 AM EDT RP Workstation: [id number]   DG Chest Portable 1 View Result Date: 08/07/2023 CLINICAL DATA:  evaluate for sob EXAM: PORTABLE CHEST 1 VIEW COMPARISON:  Chest x-ray 05/12/2023 FINDINGS: The heart and mediastinal contours are unchanged. No focal consolidation. No pulmonary edema. No pleural effusion. No pneumothorax. No acute osseous abnormality. IMPRESSION: No active disease. Electronically Signed   By: Morgane  Naveau M.D.   On: 08/07/2023 00:05     Assessment and Plan:   Elevated troponins End-stage COPD She has no chest pain and is here for acute COPD exacerbation with end-stage disease and with hospice care.  EKG does not show any acute ST-T wave changes.  Troponins have gone from [phone].  Prior cardiac catheterizations demonstrating no significant CAD.  Current presentation not consistent with ACS and likely related to demand ischemia.  In addition given her end-stage disease/DNR further evaluation such as cardiac catheterization is not within her GOC and not indicated. Stop IV heparin  Currently on BiPAP, further management per  primary team.  Nonischemic cardiomyopathy Takotsubo cardiomyopathy 2017, 202.2 She has had recurrent occurrences of stress cardiomyopathy oftentimes in the setting of COPD exacerbation.  Prior echocardiogram in June 2024 showing EF 35 to 40% however this admission echocardiogram shows normalization of EF 50 to 55%.  Overall looks euvolemic, suspect her primary issue is COPD rather than volume. Stop IV Lasix , can continue her p.o. Lasix  80 mg when she is able to take p.o. GDMT has been limited by prior history of vasovagal syncope and intolerances to medications.  Currently on spironolactone  12.5 mg.  Stop bisoprolol  2.5 mg, she can continue this as needed.  Patient has had issues with orthostatic hypotension in the past.  Mild nonobstructive CAD Hyperlipidemia Had mild plaque in proximal LAD on cardiac catheterization in 11/2020. Continue with aspirin , rosuvastatin  20 mg, Zetia   CKD Creatinine is 2 and up from baseline, no other reason not to pursue cardiac catheterization.  Risk Assessment/Risk Scores:    [place]  Heart Association (NYHA) Functional Class NYHA Class II        For questions or updates, please contact [facility] Please consult [email or web address] for contact info under    Signed, Burnetta Cart, PA-C  08/07/2023 9:15 AM

## 2023-08-07 NOTE — H&P (Signed)
 History and Physical    Kim [name] ZOX:[id number] DOB: 1956/03/11 DOA: 08/06/2023  PCP: Alto Atta, NP   Patient coming from: Home   Chief Complaint: SOB, cough   HPI: Kim [name] is a 68 y.o. female with medical history significant for steroid-dependent COPD, chronic Evoxac respiratory failure, chronic combined systolic/diastolic CHF, Takotsubo cardiomyopathy, anxiety, and CKD 3B who presents with respiratory distress.  Patient reports 2 or 3 days of worsening in her chronic dyspnea with increase in her chronic cough and sputum production.  She denies any chest discomfort associated with this.  She has not noticed any new leg swelling.  She worsened acutely last night and EMS was called.  She was found by EMS to be in respiratory distress and saturating 92% on 4 L/min of supplemental oxygen.  She was treated with steroids and albuterol  prior to arrival in the ED.  ED Course: Upon arrival to the ED, patient is found to be afebrile and saturating mid 90s on 5 L/min of supplemental oxygen with tachypnea, tachycardia, and stable BP.  Labs are most notable for creatinine 2.02, WBC 24,300, troponin 1000.75, and BNP 1057.  No acute findings noted on chest CT.  ED physician reviewed the case with cardiology, started the patient on heparin , and administered IV Lasix  and continuous albuterol  treatment.  Review of Systems:  All other systems reviewed and apart from HPI, are negative.  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/2008   OSTEOARTHRITIS 06/05/2009   PARESTHESIA 08/24/2007   TOBACCO ABUSE 01/25/2010    Past Surgical History:  Procedure Laterality Date   ABDOMINAL HYSTERECTOMY     APPENDECTOMY  1969   CARDIAC CATHETERIZATION N/A 07/05/2015   Procedure: Left Heart Cath  and Coronary Angiography;  Surgeon: Arleen Lacer, MD;  Location: [facility] INVASIVE CV LAB;  Service: Cardiovascular;  Laterality: N/A;   CHOLECYSTECTOMY  1989   collapse lung  1990   COLONOSCOPY WITH PROPOFOL  N/A 05/14/2018   Procedure: COLONOSCOPY WITH PROPOFOL ;  Surgeon: Tobin Forts, MD;  Location: [facility] ENDOSCOPY;  Service: Endoscopy;  Laterality: N/A;   EXCISION MASS ABDOMINAL  2019   EXPLORATORY LAPAROTOMY  1969   LEFT HEART CATH AND CORONARY ANGIOGRAPHY N/A 11/19/2020   Procedure: LEFT HEART CATH AND CORONARY ANGIOGRAPHY;  Surgeon: Sammy Crisp, MD;  Location: [facility] INVASIVE CV LAB;  Service: Cardiovascular;  Laterality: N/A;   POLYPECTOMY  05/14/2018   Procedure: POLYPECTOMY;  Surgeon: Tobin Forts, MD;  Location: [facility] ENDOSCOPY;  Service: Endoscopy;;   SALPINGOOPHORECTOMY  2019    Social History:   reports that she quit smoking about 9 years ago. Her smoking use included cigarettes. She started smoking about 47 years ago. She has a 76 pack-year smoking history. She has never used smokeless tobacco. She reports that she does not drink alcohol  and does not use drugs.  Allergies  Allergen Reactions   Tape Other (See Comments)    Skin tears easily, peels skin off   Daliresp  [place].George ] Other (See Comments)    Skin peeling   Cozaar  [Losartan ] Other (See Comments)    Possible cause of severe hypotension   Dupixent  [Dupilumab ] Diarrhea, Swelling, Other (See Comments) and Cough    Facial swelling Headache   Latex Other (See Comments)    Peels skin off  Penicillins Hives   Statins Other (See Comments)    Myalgia    Torsemide  Other (See Comments)    Severe stomach cramps and all over   Zestril  [Lisinopril ] Cough   Omnicef  [Cefdinir ] Rash    Family History  Problem Relation Age of Onset   Cancer Mother        lung   Heart attack Mother    Hypertension Sister    Multiple sclerosis Brother    Diabetes Sister    Rheum arthritis Sister    Thyroid  disease Sister    Multiple  sclerosis Other    Alcohol  abuse Other    Arthritis Other    Diabetes Other    Kidney disease Other    Cancer Other        lung,ovarian,skin, uterine   Stroke Other    Heart disease Other    Melanoma Other    Osteoporosis Other    Heart attack Maternal Uncle    Heart attack Other        NEICE   Heart attack Other        NEPHEW   Hypertension Brother    Stroke Maternal Grandmother    Colon cancer Neg Hx      Prior to Admission medications   Medication Sig Start Date End Date Taking? Authorizing Provider  albuterol  (VENTOLIN  HFA) 108 (90 Base) MCG/ACT inhaler INHALE 2 PUFFS INTO THE LUNGS EVERY 4 TO 6 HOURS AS NEEDED FOR SHORTNESS OF BREATH OR WHEEZING Patient taking differently: Inhale 2 puffs into the lungs every 3 (three) hours as needed for wheezing or shortness of breath. 02/02/23   Lind Repine, MD  ALPRAZolam  (XANAX ) 1 MG tablet TAKE 1 TABLET(1 MG) BY MOUTH THREE TIMES DAILY 06/18/23   Alto Atta, NP  aspirin  EC 81 MG EC tablet Take 1 tablet (81 mg total) by mouth daily. Patient taking differently: Take 81 mg by mouth every Monday, Wednesday, and Friday. 07/08/15   Samtani, Jai-Gurmukh, MD  bisoprolol  (ZEBETA ) 5 MG tablet Take 0.5 tablets (2.5 mg total) by mouth daily. 06/03/23   Swinyer, Leilani Punter, NP  budesonide -formoterol  (SYMBICORT ) 160-4.5 MCG/ACT inhaler Inhale 2 puffs into the lungs 2 (two) times daily. 10/15/22 05/12/23  Maggie Schooner, MD  butalbital -acetaminophen -caffeine  (FIORICET ) 50-325-40 MG tablet Take 1 tablet by mouth every 4 (four) hours as needed for headache. TAKE 1 TABLET BY MOUTH EVERY 4 HOURS AS NEEDED FOR HEADACHETAKE 1 TABLET BY MOUTH EVERY 4 HOURS AS NEEDED FOR HEADACHE Patient taking differently: Take 1 tablet by mouth every 4 (four) hours as needed for headache. 09/24/22   Alto Atta, NP  cetirizine (ZYRTEC) 10 MG tablet Take 10 mg by mouth daily.    [provider]  Cholecalciferol  (VITAMIN D3) 125 MCG (5000 UT) CAPS Take 5,000 Units by  mouth daily.    [provider]  Cyanocobalamin  (VITAMIN B-12) 5000 MCG SUBL Place 5,000 mcg under the tongue daily.    [provider]  ezetimibe  (ZETIA ) 10 MG tablet TAKE 1 TABLET(10 MG) BY MOUTH DAILY Patient taking differently: Take 10 mg by mouth in the morning. 01/06/23   Swinyer, Leilani Punter, NP  furosemide  (LASIX ) 40 MG tablet Take 2 tablets (80 mg total) by mouth daily. Patient taking differently: Take 80 mg by mouth at bedtime. 04/23/23   Swinyer, Leilani Punter, NP  HYDROcodone -acetaminophen  (NORCO) 7.5-325 MG tablet Take 1 tablet by mouth every 6 (six) hours as needed for moderate pain (pain score 4-6). 06/09/23   Nafziger, Randel Buss,  NP  HYDROcodone -acetaminophen  (NORCO) 7.5-325 MG tablet Take 1 tablet by mouth every 6 (six) hours as needed for moderate pain (pain score 4-6). 06/09/23   Nafziger, Randel Buss, NP  HYDROcodone -acetaminophen  (NORCO) 7.5-325 MG tablet Take 1 tablet by mouth every 6 (six) hours as needed for moderate pain (pain score 4-6). 06/09/23   Nafziger, Randel Buss, NP  ipratropium-albuterol  (DUONEB) 0.5-2.5 (3) MG/3ML SOLN Take 3 mLs by nebulization every 4 (four) hours as needed. Patient taking differently: Take 3 mLs by nebulization every 2 (two) hours as needed (for shortness of breath or wheezing). 10/15/22 05/12/23  Amin, Ankit C, MD  magnesium  oxide (MAG-OX) 400 MG tablet Take 400 mg by mouth daily.    [provider]  MUCINEX  MAXIMUM STRENGTH 1200 MG TB12 Take 1,200 mg by mouth every 12 (twelve) hours.    [provider]  nitroGLYCERIN  (NITROSTAT ) 0.4 MG SL tablet Place 1 tablet (0.4 mg total) under the tongue every 5 (five) minutes as needed for chest pain. 10/24/20   Odie Benne, MD  nystatin  (MYCOSTATIN ) 100000 UNIT/ML suspension Take 5 mLs (500,000 Units total) by mouth 4 (four) times daily. Patient taking differently: Take 5 mLs by mouth 4 (four) times daily as needed (for thrush). 11/04/22   Nafziger, Randel Buss, NP  OHTUVAYRE  3 MG/2.5ML SUSP Take 3  mg by nebulization in the morning and at bedtime. Patient not taking: Reported on 06/16/2023    [provider]  OXYGEN Inhale 2.5-3.5 L/min into the lungs continuous.    [provider]  PARoxetine  (PAXIL ) 40 MG tablet Take 0.5 tablets (20 mg total) by mouth in the morning. TAKE 1 TABLET(40 MG) BY MOUTH EVERY MORNING Patient taking differently: Take 40 mg by mouth in the morning. 04/30/23   Nafziger, Randel Buss, NP  predniSONE  (DELTASONE ) 10 MG tablet 4 tabs daily for 3 days then 3 tabs daily for 3 days then 2 tabs daily for 3 days then 1 tab daily 05/18/23   Wouk, Haynes Lips, MD  rosuvastatin  (CRESTOR ) 20 MG tablet Take 1 tablet (20 mg total) by mouth daily. 08/05/23   Odie Benne, MD  SENOKOT 8.6 MG tablet Take 1 tablet by mouth 2 (two) times daily as needed for constipation.    [provider]  spironolactone  (ALDACTONE ) 25 MG tablet TAKE 1/2 TABLET(12.5 MG) BY MOUTH DAILY Patient taking differently: Take 12.5 mg by mouth in the morning. 12/30/22   Odie Benne, MD    Physical Exam: Vitals:   08/07/23 0141 08/07/23 0231 08/07/23 0317 08/07/23 0515  BP: 107/72 116/79 111/75 109/70  Pulse: (!) 114 (!) 113 (!) 112 (!) 104  Resp: 15 16 16 20   Temp: 99 F (37.2 C) 98.2 F (36.8 C) 98.1 F (36.7 C)   TempSrc: Axillary Axillary Oral   SpO2: 96% 97% 96% 96%  Weight:      Height:         Constitutional: No pallor. No diaphoresis  Eyes: PERTLA, lids and conjunctivae normal ENMT: Mucous membranes are moist. Posterior pharynx clear of any exudate or lesions.   Neck: supple, no masses  Respiratory: Tachypneic, dyspneic with speech, markedly diminished bilaterally with prolonged expiratory phase.  Cardiovascular: Rate ~120 and regular. Trace lower extremity edema.  Abdomen: No tenderness, soft. Bowel sounds active.  Musculoskeletal: no clubbing / cyanosis. No joint deformity upper and lower extremities.   Skin: no significant rashes, lesions,  ulcers. Warm, dry, well-perfused. Neurologic: CN 2-12 grossly intact. Moving all extremities. Alert and oriented to person, place, and  situation.  Psychiatric: Pleasant. Cooperative.    Labs and Imaging on Admission: I have personally reviewed following labs and imaging studies  CBC: Recent Labs  Lab 08/06/23 2342  WBC 24.3*  NEUTROABS 18.0*  HGB 12.5  HCT 40.6  MCV 101.0*  PLT 389   Basic Metabolic Panel: Recent Labs  Lab 08/06/23 2342  NA 145  K 3.6  CL 100  CO2 27  GLUCOSE 170*  BUN 28*  CREATININE 2.02*  CALCIUM  9.6   GFR: Estimated Creatinine Clearance: 24.4 mL/min (A) (by C-G formula based on SCr of 2.02 mg/dL (H)). Liver Function Tests: Recent Labs  Lab 08/06/23 2342  AST 33  ALT 20  ALKPHOS 50  BILITOT 0.4  PROT 6.7  ALBUMIN 3.7   No results for input(s): "LIPASE", "AMYLASE" in the last 168 hours. No results for input(s): "AMMONIA" in the last 168 hours. Coagulation Profile: No results for input(s): "INR", "PROTIME" in the last 168 hours. Cardiac Enzymes: No results for input(s): "CKTOTAL", "CKMB", "CKMBINDEX", "TROPONINI" in the last 168 hours. BNP (last 3 results) Recent Labs    04/23/23 1547  PROBNP 337*   HbA1C: No results for input(s): "HGBA1C" in the last 72 hours. CBG: No results for input(s): "GLUCAP" in the last 168 hours. Lipid Profile: No results for input(s): "CHOL", "HDL", "LDLCALC", "TRIG", "CHOLHDL", "LDLDIRECT" in the last 72 hours. Thyroid  Function Tests: No results for input(s): "TSH", "T4TOTAL", "FREET4", "T3FREE", "THYROIDAB" in the last 72 hours. Anemia Panel: No results for input(s): "VITAMINB12", "FOLATE", "FERRITIN", "TIBC", "IRON", "RETICCTPCT" in the last 72 hours. Urine analysis:    Component Value Date/Time   COLORURINE YELLOW 05/17/2023 1720   APPEARANCEUR CLEAR 05/17/2023 1720   LABSPEC 1.015 05/17/2023 1720   PHURINE 7.0 05/17/2023 1720   GLUCOSEU 50 (A) 05/17/2023 1720   HGBUR NEGATIVE 05/17/2023 1720    HGBUR trace-lysed 02/26/2010 0951   BILIRUBINUR NEGATIVE 05/17/2023 1720   BILIRUBINUR negative 04/29/2021 1243   KETONESUR NEGATIVE 05/17/2023 1720   PROTEINUR NEGATIVE 05/17/2023 1720   UROBILINOGEN 0.2 04/29/2021 1243   UROBILINOGEN 0.2 02/26/2010 0951   NITRITE NEGATIVE 05/17/2023 1720   LEUKOCYTESUR NEGATIVE 05/17/2023 1720   Sepsis Labs: @LABRCNTIP (procalcitonin:4,lacticidven:4) )No results found for this or any previous visit (from the past 240 hours).   Radiological Exams on Admission: CT Chest Wo Contrast Result Date: 08/07/2023 EXAM: CT CHEST WITHOUT CONTRAST 08/07/2023 03:43:59 AM TECHNIQUE: CT of the chest was performed without the administration of intravenous contrast. Multiplanar reformatted images are provided for review. Automated exposure control, iterative reconstruction, and/or weight based adjustment of the mA/kV was utilized to reduce the radiation dose to as low as reasonably achievable. COMPARISON: Chest radiograph earlier today and CT chest dated 05/08/2022. CLINICAL HISTORY: Respiratory illness, nondiagnostic xray. Triage notes; PT arrived via EMS. Complaints of SOB the past 3 days, PT was on O2 at home sat was 92% on 4L. PT is distress and obvious wheezing. PT is alert and talking and states she is hurting all over. PT HR was 130 and O2 sat was 99% on 8L. PT was on cpap but they removed prior to arrival. PT is a DNR per paperwork from EMS. FINDINGS: MEDIASTINUM: Heart and pericardium are unremarkable. The central airways are clear. Mild 3-vessel coronary atherosclerosis. Thoracic aortic atherosclerosis. LYMPH NODES: No evidence of mediastinal, hilar or axillary lymphadenopathy. LUNGS AND PLEURA: Moderate centrilobular emphysematous changes, upper lung predominant. Mild scarring/atelectasis in the posterior left upper lobe. No focal consolidation or pulmonary edema. No evidence of pleural effusion or  pneumothorax. SOFT TISSUES/BONES: Mild anterior wedging at T6-7, chronic. No  acute abnormality of the bones or soft tissues. UPPER ABDOMEN: Status post cholecystectomy. Limited images of the upper abdomen demonstrates no acute abnormality. IMPRESSION: 1. No acute findings. Electronically signed by: Zadie Herter MD 08/07/2023 03:51 AM EDT RP Workstation: [id number]   DG Chest Portable 1 View Result Date: 08/07/2023 CLINICAL DATA:  evaluate for sob EXAM: PORTABLE CHEST 1 VIEW COMPARISON:  Chest x-ray 05/12/2023 FINDINGS: The heart and mediastinal contours are unchanged. No focal consolidation. No pulmonary edema. No pleural effusion. No pneumothorax. No acute osseous abnormality. IMPRESSION: No active disease. Electronically Signed   By: Morgane  Naveau M.D.   On: 08/07/2023 00:05    EKG: Independently reviewed. Sinus tachycardia, rate 133.   Assessment/Plan   1. COPD exacerbation; acute on chronic hypoxic respiratory failure  - Culture sputum, start antibiotic, continue systemic steroids, continue short-acting bronchodilators, continue supplemental O2    2. Elevated troponin  - Troponin is 1475, then 1654 in ED without chest pain  - Cath in August 2022 with minimal luminal narrowing of proximal LAD only   - Continue IV heparin , continue beta-blocker, ASA, and statin, check echo, follow-up on cardiology recommendations  3. Acute on chronic combined systolic & diastolic CHF  - EF was 35-40% with grade 1 diastolic dysfunction on echo from June 2024  - Continue diuresis with IV Lasix , monitor weight and I/Os, continue beta-blocker   4. AKI superimposed on CKD 3B  - SCr is 2.02 on admission, up from apparent baseline of ~1.3 - Renally-dose medications, follow closely while diuresing    5. Anxiety  - Paxil , Xanax      DVT prophylaxis: IV heparin   Code Status: DNR  Level of Care: Level of care: Progressive Family Communication: None present   Disposition Plan:  Patient is from: Home  Anticipated d/c is to: TBD Anticipated d/c date is: 08/10/23  Patient  currently: Pending stable respiratory status, echocardiogram  Consults called: Cardiology  Admission status: Inpatient     Walton Guppy, MD [facility]  08/07/2023, 5:40 AM

## 2023-08-07 NOTE — ED Notes (Signed)
 Pt on 5L nasal cannula without complication at this time. Bipap on standby. RT will continue to monitor and be available as needed.

## 2023-08-07 NOTE — Progress Notes (Signed)
 Bipap order is PRN.  Not needed at this time.  Patient in room resting comfortably.

## 2023-08-07 NOTE — Progress Notes (Signed)
 Heart Failure Navigator Progress Note  Assessed for Heart & Vascular TOC clinic readiness.  Patient does not meet criteria due to enrolled in Hospice, hospitalization more related to COPD vs CHF, EF 50-55%, No HF TOC.     Navigator will sign off at this time.   Randie Bustle, BSN, Scientist, clinical (histocompatibility and immunogenetics) Only

## 2023-08-08 DIAGNOSIS — I2510 Atherosclerotic heart disease of native coronary artery without angina pectoris: Secondary | ICD-10-CM | POA: Diagnosis not present

## 2023-08-08 DIAGNOSIS — I429 Cardiomyopathy, unspecified: Secondary | ICD-10-CM

## 2023-08-08 DIAGNOSIS — I5043 Acute on chronic combined systolic (congestive) and diastolic (congestive) heart failure: Secondary | ICD-10-CM | POA: Diagnosis not present

## 2023-08-08 DIAGNOSIS — Z515 Encounter for palliative care: Secondary | ICD-10-CM

## 2023-08-08 DIAGNOSIS — Z7189 Other specified counseling: Secondary | ICD-10-CM

## 2023-08-08 DIAGNOSIS — J441 Chronic obstructive pulmonary disease with (acute) exacerbation: Secondary | ICD-10-CM | POA: Diagnosis not present

## 2023-08-08 DIAGNOSIS — E785 Hyperlipidemia, unspecified: Secondary | ICD-10-CM

## 2023-08-08 DIAGNOSIS — R7989 Other specified abnormal findings of blood chemistry: Secondary | ICD-10-CM | POA: Diagnosis not present

## 2023-08-08 LAB — CBC
HCT: 33.9 % — ABNORMAL LOW (ref 36.0–46.0)
Hemoglobin: 10.8 g/dL — ABNORMAL LOW (ref 12.0–15.0)
MCH: 30.7 pg (ref 26.0–34.0)
MCHC: 31.9 g/dL (ref 30.0–36.0)
MCV: 96.3 fL (ref 80.0–100.0)
Platelets: 322 10*3/uL (ref 150–400)
RBC: 3.52 MIL/uL — ABNORMAL LOW (ref 3.87–5.11)
RDW: 14.7 % (ref 11.5–15.5)
WBC: 17.5 10*3/uL — ABNORMAL HIGH (ref 4.0–10.5)
nRBC: 0 % (ref 0.0–0.2)

## 2023-08-08 LAB — BASIC METABOLIC PANEL WITH GFR
Anion gap: 12 (ref 5–15)
BUN: 34 mg/dL — ABNORMAL HIGH (ref 8–23)
CO2: 29 mmol/L (ref 22–32)
Calcium: 9.3 mg/dL (ref 8.9–10.3)
Chloride: 96 mmol/L — ABNORMAL LOW (ref 98–111)
Creatinine, Ser: 1.84 mg/dL — ABNORMAL HIGH (ref 0.44–1.00)
GFR, Estimated: 29 mL/min — ABNORMAL LOW (ref >60)
Glucose, Bld: 205 mg/dL — ABNORMAL HIGH (ref 70–99)
Potassium: 3.7 mmol/L (ref 3.5–5.1)
Sodium: 137 mmol/L (ref 135–145)

## 2023-08-08 LAB — MAGNESIUM: Magnesium: 2.9 mg/dL — ABNORMAL HIGH (ref 1.7–2.4)

## 2023-08-08 LAB — UREA NITROGEN, URINE: Urea Nitrogen, Ur: 339 mg/dL

## 2023-08-08 MED ORDER — MORPHINE SULFATE (CONCENTRATE) 10 MG /0.5 ML PO SOLN
5.0000 mg | ORAL | Status: DC | PRN
  Administered 2023-08-08 – 2023-08-13 (×8): 10 mg via SUBLINGUAL
  Filled 2023-08-08 (×8): qty 0.5

## 2023-08-08 MED ORDER — PANTOPRAZOLE SODIUM 40 MG PO TBEC
40.0000 mg | DELAYED_RELEASE_TABLET | Freq: Every day | ORAL | Status: DC
  Administered 2023-08-08 – 2023-08-14 (×7): 40 mg via ORAL
  Filled 2023-08-08 (×7): qty 1

## 2023-08-08 MED ORDER — FUROSEMIDE 20 MG PO TABS
20.0000 mg | ORAL_TABLET | Freq: Every day | ORAL | Status: DC
  Administered 2023-08-08 – 2023-08-12 (×5): 20 mg via ORAL
  Filled 2023-08-08 (×5): qty 1

## 2023-08-08 NOTE — Progress Notes (Signed)
 Progress Note  Patient Name: Kim [name] Date of Encounter: 08/08/2023  Primary Cardiologist:   Antoinette Batman, MD   Subjective   No chest pain. SOB is slightly better.  Continued cough  Inpatient Medications    Scheduled Meds:  arformoterol   15 mcg Nebulization BID   aspirin  EC  81 mg Oral Q M,W,F   budesonide  (PULMICORT ) nebulizer solution  0.25 mg Nebulization BID   doxycycline   100 mg Oral Q12H   heparin  injection (subcutaneous)  5,000 Units Subcutaneous Q8H   ipratropium-albuterol   3 mL Nebulization Q6H   methylPREDNISolone  (SOLU-MEDROL ) injection  125 mg Intravenous BID   pantoprazole   40 mg Oral Daily   PARoxetine   40 mg Oral Daily   rosuvastatin   20 mg Oral Daily   sodium chloride  flush  3 mL Intravenous Q12H   Continuous Infusions:  albuterol  Stopped (08/07/23 1132)   PRN Meds: acetaminophen  **OR** acetaminophen , albuterol , ALPRAZolam , fentaNYL  (SUBLIMAZE ) injection, ondansetron  **OR** ondansetron  (ZOFRAN ) IV, oxyCODONE , senna   Vital Signs    Vitals:   08/08/23 1140 08/08/23 1141 08/08/23 1142 08/08/23 1143  BP:    119/68  Pulse:      Resp: (!) 26 (!) 23 16 17   Temp:    97.7 F (36.5 C)  TempSrc:    Oral  SpO2:      Weight:      Height:        Intake/Output Summary (Last 24 hours) at 08/08/2023 1230 Last data filed at 08/07/2023 2336 Gross per 24 hour  Intake --  Output 500 ml  Net -500 ml   Filed Weights   08/06/23 2343 08/08/23 0443  Weight: 68.5 kg 71.5 kg    Telemetry    ST - Personally Reviewed  ECG    NA - Personally Reviewed  Physical Exam   GEN: No acute distress.   Neck: No  JVD Cardiac: RRR, no murmurs, rubs, or gallops.  Respiratory:     Decreased breath sounds with occasional wheezing GI: Soft, nontender, non-distended  MS:    Mild non pitting edema Neuro:  Nonfocal  Psych: Normal affect   Labs    Chemistry Recent Labs  Lab 08/06/23 2342 08/08/23 0457  NA 145 137  K 3.6 3.7  CL 100 96*  CO2 27 29   GLUCOSE 170* 205*  BUN 28* 34*  CREATININE 2.02* 1.84*  CALCIUM  9.6 9.3  PROT 6.7  --   ALBUMIN 3.7  --   AST 33  --   ALT 20  --   ALKPHOS 50  --   BILITOT 0.4  --   GFRNONAA 26* 29*  ANIONGAP 18* 12     Hematology Recent Labs  Lab 08/06/23 2342 08/08/23 0457  WBC 24.3* 17.5*  RBC 4.02 3.52*  HGB 12.5 10.8*  HCT 40.6 33.9*  MCV 101.0* 96.3  MCH 31.1 30.7  MCHC 30.8 31.9  RDW 14.5 14.7  PLT 389 322    Cardiac EnzymesNo results for input(s): "TROPONINI" in the last 168 hours. No results for input(s): "TROPIPOC" in the last 168 hours.   BNP Recent Labs  Lab 08/06/23 2359  BNP 1,057.0*     DDimer No results for input(s): "DDIMER" in the last 168 hours.   Radiology    ECHOCARDIOGRAM COMPLETE Result Date: 08/07/2023    ECHOCARDIOGRAM REPORT   Patient Name:   Kim [name] Date of Exam: 08/07/2023 Medical Rec #:  [id number]       Height:  63.0 in Accession #:    [id number]      Weight:       151.0 lb Date of Birth:  1955-07-31      BSA:          1.716 m Patient Age:    67 years        BP:           137/83 mmHg Patient Gender: F               HR:           142 bpm. Exam Location:  Inpatient Procedure: 2D Echo, Cardiac Doppler and Color Doppler (Both Spectral and Color            Flow Doppler were utilized during procedure). Indications:    I50.21 Acute systolic (congestive) heart failure  History:        Patient has prior history of Echocardiogram examinations, most                 recent 09/11/2022. CHF, COPD; Risk Factors:Hypertension.  Sonographer:    Andrena Bang Referring Phys: [id number] TIMOTHY S OPYD  Sonographer Comments: Pt had panic attack during exam. IMPRESSIONS  1. Extremely poor windows even with contrast, exam also limited by tachycardia. EF appears at most mildly reduced. There is a septal wall abnormality, but may be due to bundle branch block. Would recommend repeating when tachycardia improved. Left ventricular ejection fraction, by estimation, is 50 to 55%.  The left ventricle has low normal function. Left ventricular endocardial border not optimally defined to evaluate regional wall motion. Left ventricular diastolic function could not be evaluated.  2. Right ventricular systolic function is normal. The right ventricular size is normal.  3. The mitral valve is grossly normal. No evidence of mitral valve regurgitation. No evidence of mitral stenosis.  4. The aortic valve is grossly normal. Aortic valve regurgitation is not visualized. No aortic stenosis is present.  5. The inferior vena cava is normal in size with greater than 50% respiratory variability, suggesting right atrial pressure of 3 mmHg. FINDINGS  Left Ventricle: Extremely poor windows even with contrast, exam also limited by tachycardia. EF appears at most mildly reduced. There is a septal wall abnormality, but may be due to bundle branch block. Would recommend repeating when tachycardia improved. Left ventricular ejection fraction, by estimation, is 50 to 55%. The left ventricle has low normal function. Left ventricular endocardial border not optimally defined to evaluate regional wall motion. Definity  contrast agent was given IV to delineate the left ventricular endocardial borders. The left ventricular internal cavity size was normal in size. There is no left ventricular hypertrophy. Left ventricular diastolic function could not be evaluated. Right Ventricle: The right ventricular size is normal. No increase in right ventricular wall thickness. Right ventricular systolic function is normal. Left Atrium: Left atrial size was normal in size. Right Atrium: Right atrial size was normal in size. Pericardium: There is no evidence of pericardial effusion. Mitral Valve: The mitral valve is grossly normal. No evidence of mitral valve regurgitation. No evidence of mitral valve stenosis. Tricuspid Valve: The tricuspid valve is normal in structure. Tricuspid valve regurgitation is not demonstrated. No evidence of  tricuspid stenosis. Aortic Valve: The aortic valve is grossly normal. Aortic valve regurgitation is not visualized. No aortic stenosis is present. Aortic valve mean gradient measures 3.0 mmHg. Aortic valve peak gradient measures 7.2 mmHg. Pulmonic Valve: The pulmonic valve was not well visualized. Pulmonic valve regurgitation is not  visualized. No evidence of pulmonic stenosis. Aorta: The aortic root is normal in size and structure. Venous: The inferior vena cava is normal in size with greater than 50% respiratory variability, suggesting right atrial pressure of 3 mmHg. IAS/Shunts: No atrial level shunt detected by color flow Doppler.   Diastology LV e' lateral: 13.70 cm/s  RIGHT VENTRICLE RV S prime:     16.50 cm/s TAPSE (M-mode): 1.0 cm LEFT ATRIUM             Index LA Vol (A2C):   27.1 ml 15.79 ml/m LA Vol (A4C):   15.5 ml 9.03 ml/m LA Biplane Vol: 20.2 ml 11.77 ml/m  AORTIC VALVE AV Vmax:           134.00 cm/s AV Vmean:          80.200 cm/s AV VTI:            0.140 m AV Peak Grad:      7.2 mmHg AV Mean Grad:      3.0 mmHg LVOT Vmax:         99.00 cm/s LVOT Vmean:        54.800 cm/s LVOT VTI:          0.142 m LVOT/AV VTI ratio: 1.01  AORTA Ao Asc diam: 3.00 cm  SHUNTS Systemic VTI: 0.14 m Arta Lark Electronically signed by Arta Lark Signature Date/Time: 08/07/2023/9:00:21 AM    Final    CT Chest Wo Contrast Result Date: 08/07/2023 EXAM: CT CHEST WITHOUT CONTRAST 08/07/2023 03:43:59 AM TECHNIQUE: CT of the chest was performed without the administration of intravenous contrast. Multiplanar reformatted images are provided for review. Automated exposure control, iterative reconstruction, and/or weight based adjustment of the mA/kV was utilized to reduce the radiation dose to as low as reasonably achievable. COMPARISON: Chest radiograph earlier today and CT chest dated 05/08/2022. CLINICAL HISTORY: Respiratory illness, nondiagnostic xray. Triage notes; PT arrived via EMS. Complaints of SOB the past 3  days, PT was on O2 at home sat was 92% on 4L. PT is distress and obvious wheezing. PT is alert and talking and states she is hurting all over. PT HR was 130 and O2 sat was 99% on 8L. PT was on cpap but they removed prior to arrival. PT is a DNR per paperwork from EMS. FINDINGS: MEDIASTINUM: Heart and pericardium are unremarkable. The central airways are clear. Mild 3-vessel coronary atherosclerosis. Thoracic aortic atherosclerosis. LYMPH NODES: No evidence of mediastinal, hilar or axillary lymphadenopathy. LUNGS AND PLEURA: Moderate centrilobular emphysematous changes, upper lung predominant. Mild scarring/atelectasis in the posterior left upper lobe. No focal consolidation or pulmonary edema. No evidence of pleural effusion or pneumothorax. SOFT TISSUES/BONES: Mild anterior wedging at T6-7, chronic. No acute abnormality of the bones or soft tissues. UPPER ABDOMEN: Status post cholecystectomy. Limited images of the upper abdomen demonstrates no acute abnormality. IMPRESSION: 1. No acute findings. Electronically signed by: Zadie Herter MD 08/07/2023 03:51 AM EDT RP Workstation: [id number]   DG Chest Portable 1 View Result Date: 08/07/2023 CLINICAL DATA:  evaluate for sob EXAM: PORTABLE CHEST 1 VIEW COMPARISON:  Chest x-ray 05/12/2023 FINDINGS: The heart and mediastinal contours are unchanged. No focal consolidation. No pulmonary edema. No pleural effusion. No pneumothorax. No acute osseous abnormality. IMPRESSION: No active disease. Electronically Signed   By: Morgane  Naveau M.D.   On: 08/07/2023 00:05    Cardiac Studies   Echo:  See above.   Patient Profile     68 y.o. female with a hx of nonischemic  cardiomyopathy with reduced EF, Takotsubo cardiomyopathy ,vasovagal syncope, mild nonobstructive CAD, end-stage COPD, hyperlipidemia, hypertension, CKD, who is being seen 08/07/2023 for the evaluation of elevated trops at the request of Dr. Brice Campi.   Assessment & Plan    Elevated troponin:  Not thought  to be related to acute coronary syndrome.  Heparin  stopped.    COPD flare :  Per primary team.     CAD:  Non obstructive.  No evidence of acute coronary syndrome.  No change in therapy.    Cardiomyopathy:  EF appears to be low normal.  Appears to be euvolemic.  IV Lasix  stopped yesterday.  She is concerned about some increased edema she is seeing.  I will add low dose PO Lasix .   Stress-induced cardiomyopathy:   Low normal EF.  See above.    Hyperlipidemia:  Continue statin.     DNR  For questions or updates, please contact [facility] Please consult [email or web address] for contact info under Cardiology/STEMI.   Signed, Eilleen Grates, MD  08/08/2023, 12:30 PM

## 2023-08-08 NOTE — Progress Notes (Signed)
 PT Cancellation Note  Patient Details Name: Kim [name] MRN: [id number] DOB: 1955-07-20   Cancelled Treatment:    Reason Eval/Treat Not Completed: Patient declined, no reason specified  Declines PT evaluation today but would like for us  to check on her tomorrow. States HR increases and she develops panic attacks when she tries to move. Of note, pt under hospice care prior to admission.  Jory Ng, PT, DPT [facility]  Rehabilitation Services Physical Therapist Office: [phone] Website: [email or web address]   Alinda Irani 08/08/2023, 10:23 AM

## 2023-08-08 NOTE — Progress Notes (Signed)
 PROGRESS NOTE    KEMILY HANAWALT  QIO:[id number] DOB: 1955/04/22 DOA: 08/06/2023 PCP: Alto Atta, NP   Brief Narrative: 68 year old with past medical history significant for steroid-dependent COPD, chronic hypoxic respiratory failure, chronic combined systolic and diastolic heart failure, Takotsubo cardiomyopathy, anxiety, CKD 3B who presents with respiratory distress.  Reports 2 or 3 days of worsening of her chronic dyspnea with increased cough and sputum production.  Patient was found respiratory distress by EMS, satting 92% on 4 L.  On arrival to the ED she was afebrile oxygen sat 90% on 5 L, tachypnea tachycardia creatinine 2.0 troponin 1000, BNP 1057.  Cardiology was consulted, patient was started on IV heparin    Assessment & Plan:   Principal Problem:   COPD with acute exacerbation (HCC) Active Problems:   Elevated troponin   Essential hypertension   Acute renal failure superimposed on stage 3b chronic kidney disease (HCC)   Acute on chronic combined systolic and diastolic CHF (congestive heart failure) (HCC)   Acute on chronic respiratory failure with hypoxia (HCC)   Acute on chronic hypoxic respiratory failure COPD exacerbation - Did require BiPAP initially, but nothing overnight, oxygen requirement has improved, down to 4 L . - Start Brovana  and Pulmicort  - Start as needed albuterol  every 2 hours as needed - Continue doxycycline  - Continue DuoNeb - IV steroids, on significant dose 125 mg IV twice daily, will keep current dose today as she still with significant wheezing Continue Xanax  every 4 hours as needed, patient has also component of anxiety - Patient is home with hospice, but she would like to continue current active therapy  Elevation of troponin: In setting of demand ischemia in the setting of COPD - In setting of demand ischemia, due to respiratory failure.  Cardiology input greatly appreciated, IV heparin  drip discontinued .  Chronic combined systolic and  diastolic heart failure - Cardiology does not think that she is markedly over loaded.   - Diuresis per cardiology, she is currently on p.o. Lasix  .  Bisoprolol  changed as needed because she has a history of orthostatic hypotension  AKI on CKD 3B -Baseline Creatinine 1.3  Anxiety: Continue with Xanax  1 mg every 4 hours as needed   Estimated body mass index is 27.92 kg/m as calculated from the following:   Height as of this encounter: 5\' 3"  (1.6 m).   Weight as of this encounter: 71.5 kg.   DVT prophylaxis: Subcu heparin  Code Status: Patient DNR/DNI Family Communication: Care discussed with husband at bedside Disposition Plan:  Status is: Inpatient Remains inpatient appropriate because: Remain inpatient for COPD exacerbation    Consultants:  Palliative care Cardiology  Procedures:  none  Antimicrobials:    Subjective: No BiPAP requirement overnight, reports dyspnea has improved  Objective: Vitals:   08/08/23 1140 08/08/23 1141 08/08/23 1142 08/08/23 1143  BP:    119/68  Pulse:      Resp: (!) 26 (!) 23 16 17   Temp:    97.7 F (36.5 C)  TempSrc:    Oral  SpO2:      Weight:      Height:        Intake/Output Summary (Last 24 hours) at 08/08/2023 1439 Last data filed at 08/07/2023 2336 Gross per 24 hour  Intake --  Output 500 ml  Net -500 ml   Filed Weights   08/06/23 2343 08/08/23 0443  Weight: 68.5 kg 71.5 kg    Examination:  Awake Alert, Oriented X 3, No new F.N deficits, Normal affect  Symmetrical Chest wall movement, good air entry today, but still with some wheezing RRR,No Gallops,Rubs or new Murmurs, No Parasternal Heave +ve B.Sounds, Abd Soft, No tenderness, No rebound - guarding or rigidity. No Cyanosis, Clubbing or edema, No new Rash or bruise      Data Reviewed: I have personally reviewed following labs and imaging studies  CBC: Recent Labs  Lab 08/06/23 2342 08/08/23 0457  WBC 24.3* 17.5*  NEUTROABS 18.0*  --   HGB 12.5 10.8*  HCT  40.6 33.9*  MCV 101.0* 96.3  PLT 389 322   Basic Metabolic Panel: Recent Labs  Lab 08/06/23 2342 08/08/23 0457  NA 145 137  K 3.6 3.7  CL 100 96*  CO2 27 29  GLUCOSE 170* 205*  BUN 28* 34*  CREATININE 2.02* 1.84*  CALCIUM  9.6 9.3  MG  --  2.9*   GFR: Estimated Creatinine Clearance: 27.3 mL/min (A) (by C-G formula based on SCr of 1.84 mg/dL (H)). Liver Function Tests: Recent Labs  Lab 08/06/23 2342  AST 33  ALT 20  ALKPHOS 50  BILITOT 0.4  PROT 6.7  ALBUMIN 3.7   No results for input(s): "LIPASE", "AMYLASE" in the last 168 hours. No results for input(s): "AMMONIA" in the last 168 hours. Coagulation Profile: No results for input(s): "INR", "PROTIME" in the last 168 hours. Cardiac Enzymes: No results for input(s): "CKTOTAL", "CKMB", "CKMBINDEX", "TROPONINI" in the last 168 hours. BNP (last 3 results) Recent Labs    04/23/23 1547  PROBNP 337*   HbA1C: No results for input(s): "HGBA1C" in the last 72 hours. CBG: No results for input(s): "GLUCAP" in the last 168 hours. Lipid Profile: No results for input(s): "CHOL", "HDL", "LDLCALC", "TRIG", "CHOLHDL", "LDLDIRECT" in the last 72 hours. Thyroid  Function Tests: No results for input(s): "TSH", "T4TOTAL", "FREET4", "T3FREE", "THYROIDAB" in the last 72 hours. Anemia Panel: No results for input(s): "VITAMINB12", "FOLATE", "FERRITIN", "TIBC", "IRON", "RETICCTPCT" in the last 72 hours. Sepsis Labs: No results for input(s): "PROCALCITON", "LATICACIDVEN" in the last 168 hours.  No results found for this or any previous visit (from the past 240 hours).       Radiology Studies: ECHOCARDIOGRAM COMPLETE Result Date: 08/07/2023    ECHOCARDIOGRAM REPORT   Patient Name:   MARIANGELA LANTHIER Date of Exam: 08/07/2023 Medical Rec #:  [id number]       Height:       63.0 in Accession #:    [id number]      Weight:       151.0 lb Date of Birth:  04-13-1955      BSA:          1.716 m Patient Age:    68 years        BP:           137/83  mmHg Patient Gender: F               HR:           142 bpm. Exam Location:  Inpatient Procedure: 2D Echo, Cardiac Doppler and Color Doppler (Both Spectral and Color            Flow Doppler were utilized during procedure). Indications:    I50.21 Acute systolic (congestive) heart failure  History:        Patient has prior history of Echocardiogram examinations, most                 recent 09/11/2022. CHF, COPD; Risk Factors:Hypertension.  Sonographer:    Media Spikes  [facility] Referring Phys: [id number] TIMOTHY S OPYD  Sonographer Comments: Pt had panic attack during exam. IMPRESSIONS  1. Extremely poor windows even with contrast, exam also limited by tachycardia. EF appears at most mildly reduced. There is a septal wall abnormality, but may be due to bundle branch block. Would recommend repeating when tachycardia improved. Left ventricular ejection fraction, by estimation, is 50 to 55%. The left ventricle has low normal function. Left ventricular endocardial border not optimally defined to evaluate regional wall motion. Left ventricular diastolic function could not be evaluated.  2. Right ventricular systolic function is normal. The right ventricular size is normal.  3. The mitral valve is grossly normal. No evidence of mitral valve regurgitation. No evidence of mitral stenosis.  4. The aortic valve is grossly normal. Aortic valve regurgitation is not visualized. No aortic stenosis is present.  5. The inferior vena cava is normal in size with greater than 50% respiratory variability, suggesting right atrial pressure of 3 mmHg. FINDINGS  Left Ventricle: Extremely poor windows even with contrast, exam also limited by tachycardia. EF appears at most mildly reduced. There is a septal wall abnormality, but may be due to bundle branch block. Would recommend repeating when tachycardia improved. Left ventricular ejection fraction, by estimation, is 50 to 55%. The left ventricle has low normal function. Left ventricular endocardial border not  optimally defined to evaluate regional wall motion. Definity  contrast agent was given IV to delineate the left ventricular endocardial borders. The left ventricular internal cavity size was normal in size. There is no left ventricular hypertrophy. Left ventricular diastolic function could not be evaluated. Right Ventricle: The right ventricular size is normal. No increase in right ventricular wall thickness. Right ventricular systolic function is normal. Left Atrium: Left atrial size was normal in size. Right Atrium: Right atrial size was normal in size. Pericardium: There is no evidence of pericardial effusion. Mitral Valve: The mitral valve is grossly normal. No evidence of mitral valve regurgitation. No evidence of mitral valve stenosis. Tricuspid Valve: The tricuspid valve is normal in structure. Tricuspid valve regurgitation is not demonstrated. No evidence of tricuspid stenosis. Aortic Valve: The aortic valve is grossly normal. Aortic valve regurgitation is not visualized. No aortic stenosis is present. Aortic valve mean gradient measures 3.0 mmHg. Aortic valve peak gradient measures 7.2 mmHg. Pulmonic Valve: The pulmonic valve was not well visualized. Pulmonic valve regurgitation is not visualized. No evidence of pulmonic stenosis. Aorta: The aortic root is normal in size and structure. Venous: The inferior vena cava is normal in size with greater than 50% respiratory variability, suggesting right atrial pressure of 3 mmHg. IAS/Shunts: No atrial level shunt detected by color flow Doppler.   Diastology LV e' lateral: 13.70 cm/s  RIGHT VENTRICLE RV S prime:     16.50 cm/s TAPSE (M-mode): 1.0 cm LEFT ATRIUM             Index LA Vol (A2C):   27.1 ml 15.79 ml/m LA Vol (A4C):   15.5 ml 9.03 ml/m LA Biplane Vol: 20.2 ml 11.77 ml/m  AORTIC VALVE AV Vmax:           134.00 cm/s AV Vmean:          80.200 cm/s AV VTI:            0.140 m AV Peak Grad:      7.2 mmHg AV Mean Grad:      3.0 mmHg LVOT Vmax:         99.00  cm/s  LVOT Vmean:        54.800 cm/s LVOT VTI:          0.142 m LVOT/AV VTI ratio: 1.01  AORTA Ao Asc diam: 3.00 cm  SHUNTS Systemic VTI: 0.14 m Arta Lark Electronically signed by Arta Lark Signature Date/Time: 08/07/2023/9:00:21 AM    Final    CT Chest Wo Contrast Result Date: 08/07/2023 EXAM: CT CHEST WITHOUT CONTRAST 08/07/2023 03:43:59 AM TECHNIQUE: CT of the chest was performed without the administration of intravenous contrast. Multiplanar reformatted images are provided for review. Automated exposure control, iterative reconstruction, and/or weight based adjustment of the mA/kV was utilized to reduce the radiation dose to as low as reasonably achievable. COMPARISON: Chest radiograph earlier today and CT chest dated 05/08/2022. CLINICAL HISTORY: Respiratory illness, nondiagnostic xray. Triage notes; PT arrived via EMS. Complaints of SOB the past 3 days, PT was on O2 at home sat was 92% on 4L. PT is distress and obvious wheezing. PT is alert and talking and states she is hurting all over. PT HR was 130 and O2 sat was 99% on 8L. PT was on cpap but they removed prior to arrival. PT is a DNR per paperwork from EMS. FINDINGS: MEDIASTINUM: Heart and pericardium are unremarkable. The central airways are clear. Mild 3-vessel coronary atherosclerosis. Thoracic aortic atherosclerosis. LYMPH NODES: No evidence of mediastinal, hilar or axillary lymphadenopathy. LUNGS AND PLEURA: Moderate centrilobular emphysematous changes, upper lung predominant. Mild scarring/atelectasis in the posterior left upper lobe. No focal consolidation or pulmonary edema. No evidence of pleural effusion or pneumothorax. SOFT TISSUES/BONES: Mild anterior wedging at T6-7, chronic. No acute abnormality of the bones or soft tissues. UPPER ABDOMEN: Status post cholecystectomy. Limited images of the upper abdomen demonstrates no acute abnormality. IMPRESSION: 1. No acute findings. Electronically signed by: Zadie Herter MD 08/07/2023  03:51 AM EDT RP Workstation: [id number]   DG Chest Portable 1 View Result Date: 08/07/2023 CLINICAL DATA:  evaluate for sob EXAM: PORTABLE CHEST 1 VIEW COMPARISON:  Chest x-ray 05/12/2023 FINDINGS: The heart and mediastinal contours are unchanged. No focal consolidation. No pulmonary edema. No pleural effusion. No pneumothorax. No acute osseous abnormality. IMPRESSION: No active disease. Electronically Signed   By: Morgane  Naveau M.D.   On: 08/07/2023 00:05        Scheduled Meds:  arformoterol   15 mcg Nebulization BID   aspirin  EC  81 mg Oral Q M,W,F   budesonide  (PULMICORT ) nebulizer solution  0.25 mg Nebulization BID   doxycycline   100 mg Oral Q12H   furosemide   20 mg Oral Daily   heparin  injection (subcutaneous)  5,000 Units Subcutaneous Q8H   ipratropium-albuterol   3 mL Nebulization Q6H   methylPREDNISolone  (SOLU-MEDROL ) injection  125 mg Intravenous BID   pantoprazole   40 mg Oral Daily   PARoxetine   40 mg Oral Daily   rosuvastatin   20 mg Oral Daily   sodium chloride  flush  3 mL Intravenous Q12H   Continuous Infusions:  albuterol  Stopped (08/07/23 1132)     LOS: 1 day       Seena Dadds, MD [facility]   If 7PM-7AM, please contact night-coverage [email or web address]  08/08/2023, 2:39 PM

## 2023-08-08 NOTE — Consult Note (Addendum)
 Palliative Care Consult Note                                  Date: 08/08/2023   Patient Name: Kim [name]  DOB: 02/15/1956  MRN: [id number]  Age / Sex: 68 y.o., female  PCP: Kim Atta, NP Referring Physician: Epifanio Haste, MD  Reason for Consultation: Establishing goals of care  HPI/Patient Profile: 68 y.o. female  with past medical history of advanced COPD, chronic hypoxic respiratory failure, chronic combined systolic and diastolic heart failure, Takotsubo cardiomyopathy, CKD stage IIIb, and anxiety who presented to the ED on 08/06/2023 with respiratory distress.  She reports 2 to 3 days of worsening chronic dyspnea with increased cough and sputum production.  She is admitted with COPD exacerbation, acute on chronic hypoxic respiratory failure, and elevated troponin likely demand ischemia.  Palliative Medicine has been consulted for goals of care.   Subjective:   Extensive chart review has been completed prior to meeting with patient/family including labs, vital signs, imaging, progress/consult notes, orders, medications and available advance directive documents.   I met with patient and her husband Kim [name] at bedside to discuss diagnosis, prognosis, and GOC.  Patient is known to PMT from her previous hospitalization in July 2024.  She was also enrolled with outpatient palliative through [facility].  I re-introduced Palliative Medicine as specialized medical care for people living with serious illness.   Created space and opportunity for patient and husband to express thoughts and feelings regarding current medical situation. Values and goals of care were attempted to be elicited.  Social History: Patient and husband have been married for 22 years.  Patient has 2 sons (from previous marriage), as well as several grandchildren.  Functional Status: Patient reports she spends most of her time in bed, however is  able to transfer from her hospital bed to the bedside commode.  She is ambulatory for very short distances only.  She requires assistance with bathing.  GOC Discussion: We discussed patient's current illness and what it means in the larger context of her ongoing co-morbidities. Current clinical status was reviewed.  Also reviewed natural trajectory of advanced COPD.  Patient shares that in March 2025, she enrolled in home hospice through [facility], and has been happy with their services.  She wants to re-enroll in hospice upon discharge from the hospital.  She speaks to the importance of quality of life and ongoing symptom management for anxiety and dyspnea.  She reports that morphine  solution and Ativan  have been effective.   Review of Systems  Respiratory:  Positive for shortness of breath.   Psychiatric/Behavioral:  The patient is nervous/anxious.     Objective:   Primary Diagnoses: Present on Admission:  Essential hypertension  Elevated troponin  COPD with acute exacerbation (HCC)  Acute renal failure superimposed on stage 3b chronic kidney disease (HCC)  Acute on chronic combined systolic and diastolic CHF (congestive heart failure) (HCC)  Acute on chronic respiratory failure with hypoxia [facility])   Physical Exam Vitals reviewed.  Constitutional:      General: She is not in acute distress.    Comments: Chronically ill-appearing  Pulmonary:     Effort: Pulmonary effort is normal.  Genitourinary:    Comments: Chronic Foley Neurological:     Mental Status: She is alert and oriented to person, place, and time.     Palliative Assessment/Data: PPS 40%     Assessment &  Plan:   SUMMARY OF RECOMMENDATIONS   Continue current supportive interventions Goal of care is for medical stabilization and to return home with hospice PMT will continue to follow  Primary Decision Maker: Patient  Code Status/Advance Care Planning: DNR - Limited  Symptom Management:  Morphine   concentrate solution 5 to 10 mg every 4 hours as needed for pain or dyspnea Discontinue prn IV fentanyl   Prognosis:  < 6 months  Discharge Planning:  Home with Hospice    Thank you for allowing us  to participate in the care of Kim [name]   Time Total: 75 minutes  Detailed review of medical records (labs, imaging, vital signs), medically appropriate exam, discussed with treatment team, counseling and education to patient, family, & staff, documenting clinical information, medication management, coordination of care.   Signed by: Kim Scotland, NP Palliative Medicine Team  Team Phone # [phone]  For individual providers, please see AMION

## 2023-08-09 DIAGNOSIS — J441 Chronic obstructive pulmonary disease with (acute) exacerbation: Secondary | ICD-10-CM | POA: Diagnosis not present

## 2023-08-09 DIAGNOSIS — Z515 Encounter for palliative care: Secondary | ICD-10-CM | POA: Diagnosis not present

## 2023-08-09 DIAGNOSIS — J9621 Acute and chronic respiratory failure with hypoxia: Secondary | ICD-10-CM | POA: Diagnosis not present

## 2023-08-09 LAB — BASIC METABOLIC PANEL WITH GFR
Anion gap: 13 (ref 5–15)
BUN: 44 mg/dL — ABNORMAL HIGH (ref 8–23)
CO2: 29 mmol/L (ref 22–32)
Calcium: 9.2 mg/dL (ref 8.9–10.3)
Chloride: 96 mmol/L — ABNORMAL LOW (ref 98–111)
Creatinine, Ser: 1.65 mg/dL — ABNORMAL HIGH (ref 0.44–1.00)
GFR, Estimated: 33 mL/min — ABNORMAL LOW (ref >60)
Glucose, Bld: 200 mg/dL — ABNORMAL HIGH (ref 70–99)
Potassium: 4.2 mmol/L (ref 3.5–5.1)
Sodium: 138 mmol/L (ref 135–145)

## 2023-08-09 LAB — CBC
HCT: 34.1 % — ABNORMAL LOW (ref 36.0–46.0)
Hemoglobin: 10.8 g/dL — ABNORMAL LOW (ref 12.0–15.0)
MCH: 30.9 pg (ref 26.0–34.0)
MCHC: 31.7 g/dL (ref 30.0–36.0)
MCV: 97.7 fL (ref 80.0–100.0)
Platelets: 307 10*3/uL (ref 150–400)
RBC: 3.49 MIL/uL — ABNORMAL LOW (ref 3.87–5.11)
RDW: 14.5 % (ref 11.5–15.5)
WBC: 16.1 10*3/uL — ABNORMAL HIGH (ref 4.0–10.5)
nRBC: 0.1 % (ref 0.0–0.2)

## 2023-08-09 LAB — MAGNESIUM: Magnesium: 2.4 mg/dL (ref 1.7–2.4)

## 2023-08-09 LAB — PHOSPHORUS: Phosphorus: 3.3 mg/dL (ref 2.5–4.6)

## 2023-08-09 MED ORDER — BISACODYL 5 MG PO TBEC
5.0000 mg | DELAYED_RELEASE_TABLET | Freq: Every day | ORAL | Status: DC | PRN
  Administered 2023-08-09 – 2023-08-12 (×2): 5 mg via ORAL
  Filled 2023-08-09 (×2): qty 1

## 2023-08-09 MED ORDER — CHLORHEXIDINE GLUCONATE CLOTH 2 % EX PADS
6.0000 | MEDICATED_PAD | Freq: Every day | CUTANEOUS | Status: DC
Start: 2023-08-09 — End: 2023-08-14
  Administered 2023-08-09 – 2023-08-14 (×6): 6 via TOPICAL

## 2023-08-09 MED ORDER — IPRATROPIUM-ALBUTEROL 0.5-2.5 (3) MG/3ML IN SOLN
3.0000 mL | Freq: Three times a day (TID) | RESPIRATORY_TRACT | Status: DC
  Administered 2023-08-09 – 2023-08-13 (×15): 3 mL via RESPIRATORY_TRACT
  Filled 2023-08-09 (×15): qty 3

## 2023-08-09 MED ORDER — POLYETHYLENE GLYCOL 3350 17 G PO PACK
17.0000 g | PACK | Freq: Every day | ORAL | Status: DC
  Administered 2023-08-09 – 2023-08-14 (×6): 17 g via ORAL
  Filled 2023-08-09 (×5): qty 1

## 2023-08-09 MED ORDER — SODIUM CHLORIDE 0.9 % IV SOLN
2.0000 g | INTRAVENOUS | Status: DC
  Administered 2023-08-09 – 2023-08-11 (×3): 2 g via INTRAVENOUS
  Filled 2023-08-09 (×3): qty 20

## 2023-08-09 NOTE — Evaluation (Signed)
 Occupational Therapy Evaluation Patient Details Name: Kim [name] MRN: [id number] DOB: June 01, 1955 Today's Date: 08/09/2023   History of Present Illness   Pt is 68 yo presentign to [facility] Ed on 5/1 due to worsening dyspnea/productive cough with increased heart rate. PMH - copd, chronic resp failure on 2-3L O2,  cad, htn, chf, Takostubo cardiomyopathy, SAH, orthostatic hypotension, anxiety, depression     Clinical Impressions Pt reports having assist at baseline with ADLs and pivot transfers to/from w/c or transport chair for mobility multiple times per day. Pt has aide that comes 2 days/week and spouse to assist. Pt currently needing up to max A for ADLs, CGA for bed mobility and CGA for standing at EOB. VSS on 4L O2 throughout session. Pt currently active with hospice care, plans to continue upon return home. Pt declines OT services during hospital admission, will s/o. Please reconsult if there is a change in pt status.      If plan is discharge home, recommend the following:   A lot of help with bathing/dressing/bathroom;A little help with walking and/or transfers;Assistance with cooking/housework;Direct supervision/assist for medications management;Direct supervision/assist for financial management;Assist for transportation;Help with stairs or ramp for entrance     Functional Status Assessment   Patient has had a recent decline in their functional status and demonstrates the ability to make significant improvements in function in a reasonable and predictable amount of time.     Equipment Recommendations   None recommended by OT (pt has all needed DME)     Recommendations for Other Services         Precautions/Restrictions   Precautions Precautions: Fall;Other (comment) Recall of Precautions/Restrictions: Intact Precaution/Restrictions Comments: Watch O2 sats Restrictions Weight Bearing Restrictions Per Provider Order: No     Mobility Bed Mobility Overal bed  mobility: Needs Assistance Bed Mobility: Supine to Sit, Sit to Supine Rolling: Contact guard assist, Used rails   Supine to sit: Contact guard Sit to supine: Contact guard assist        Transfers Overall transfer level: Needs assistance Equipment used: None Transfers: Sit to/from Stand Sit to Stand: Contact guard assist                  Balance Overall balance assessment: Needs assistance Sitting-balance support: Single extremity supported Sitting balance-Leahy Scale: Good     Standing balance support: No upper extremity supported Standing balance-Leahy Scale: Fair Standing balance comment: no overt LOB                           ADL either performed or assessed with clinical judgement   ADL Overall ADL's : Needs assistance/impaired Eating/Feeding: Set up;Sitting   Grooming: Set up;Sitting   Upper Body Bathing: Moderate assistance;Sitting   Lower Body Bathing: Maximal assistance;Sitting/lateral leans   Upper Body Dressing : Moderate assistance;Sitting   Lower Body Dressing: Maximal assistance;Sitting/lateral leans   Toilet Transfer: Contact guard assist   Toileting- Clothing Manipulation and Hygiene: Total assistance Toileting - Clothing Manipulation Details (indicate cue type and reason): catheter     Functional mobility during ADLs: Contact guard assist       Vision   Vision Assessment?: No apparent visual deficits     Perception Perception: Not tested       Praxis Praxis: Not tested       Pertinent Vitals/Pain Pain Assessment Pain Assessment: No/denies pain     Extremity/Trunk Assessment Upper Extremity Assessment Upper Extremity Assessment: Generalized weakness   Lower Extremity  Assessment Lower Extremity Assessment: Defer to PT evaluation       Communication Communication Communication: No apparent difficulties   Cognition Arousal: Alert Behavior During Therapy: WFL for tasks assessed/performed Cognition: No  apparent impairments                               Following commands: Intact       Cueing  General Comments   Cueing Techniques: Verbal cues  VSS on 4L O2, HR up to 110's with activity   Exercises     Shoulder Instructions      Home Living Family/patient expects to be discharged to:: Private residence Living Arrangements: Spouse/significant other Available Help at Discharge: Family;Available 24 hours/day Type of Home: House Home Access: Ramped entrance;Level entry     Home Layout: Two level     Bathroom Shower/Tub: Tub/shower unit;Walk-in shower   Bathroom Toilet: Standard     Home Equipment: Agricultural consultant (2 wheels);Wheelchair - Advertising account executive;Shower seat   Additional Comments: aide 2 days/week, assist for bathing at bed level; wears 2.5L O2 baseline      Prior Functioning/Environment Prior Level of Function : Needs assist;History of Falls (last six months)             Mobility Comments: pivot transfers bed > w/c does get in [facility] and does get to shower chair at times ADLs Comments: assist for bathing, dressing, toileting; gets in the shower once every 2 weeks    OT Problem List: Decreased strength;Decreased range of motion;Impaired balance (sitting and/or standing);Decreased activity tolerance;Decreased safety awareness;Cardiopulmonary status limiting activity   OT Treatment/Interventions:        OT Goals(Current goals can be found in the care plan section)   Acute Rehab OT Goals Patient Stated Goal: none stated OT Goal Formulation: With patient Time For Goal Achievement: 08/23/23 Potential to Achieve Goals: Fair   OT Frequency:       Co-evaluation PT/OT/SLP Co-Evaluation/Treatment: Yes Reason for Co-Treatment: Complexity of the patient's impairments (multi-system involvement);For patient/therapist safety;To address functional/ADL transfers PT goals addressed during session: Mobility/safety  with mobility;Balance OT goals addressed during session: ADL's and self-care      AM-PAC OT "6 Clicks" Daily Activity     Outcome Measure Help from another person eating meals?: A Little Help from another person taking care of personal grooming?: A Little Help from another person toileting, which includes using toliet, bedpan, or urinal?: A Lot Help from another person bathing (including washing, rinsing, drying)?: A Lot Help from another person to put on and taking off regular upper body clothing?: A Lot Help from another person to put on and taking off regular lower body clothing?: A Lot 6 Click Score: 14   End of Session Equipment Utilized During Treatment: Gait belt;Rolling walker (2 wheels) Nurse Communication: Mobility status; confirmed with MD pt appropriate to see  Activity Tolerance: Patient tolerated treatment well Patient left: in bed;with call bell/phone within reach;with bed alarm set  OT Visit Diagnosis: Unsteadiness on feet (R26.81);Other abnormalities of gait and mobility (R26.89);Muscle weakness (generalized) (M62.81)                Time: 4132-4401 OT Time Calculation (min): 27 min Charges:  OT General Charges $OT Visit: 1 Visit OT Evaluation $OT Eval Moderate Complexity: 1 Mod  Mahaila Tischer K, OTD, OTR/L SecureChat Preferred Acute Rehab [phone]   Averee Harb K Koonce 08/09/2023, 3:45 PM

## 2023-08-09 NOTE — Plan of Care (Signed)

## 2023-08-09 NOTE — Evaluation (Signed)
 Physical Therapy Brief Evaluation and Discharge Note Patient Details Name: Kim [name] MRN: [id number] DOB: 02-06-1956 Today's Date: 08/09/2023   History of Present Illness  Pt is 68 yo presentign to [facility] on 5/1 due to worsening dyspnea/productive cough with increased heart rate. PMH - copd, chronic resp failure on 2-3L O2,  cad, htn, chf, Takostubo cardiomyopathy, SAH, orthostatic hypotension, anxiety, depression  Clinical Impression  Pt is presenting close to baseline level of functioning. HR and O2 sats seem to be improving with mobility from pt reports on initial hospitalization. Currently HR remained around 108-110 bpm with activity and O2 sats around 91% with very brief drop to 87% after standing. Currently pt is presenting at baseline level of functioning and no skilled physical therapy services recommended. Pt will be discharged from skilled physical therapy services at this time; please re-consult if further needs arise.           PT Assessment Patient does not need any further PT services  Assistance Needed at Discharge  PRN    Equipment Recommendations None recommended by PT     Precautions/Restrictions Precautions Precautions: Fall;Other (comment) Recall of Precautions/Restrictions: Intact Precaution/Restrictions Comments: Watch O2 sats Restrictions Weight Bearing Restrictions Per Provider Order: No        Mobility  Bed Mobility Rolling: Contact guard assist, Used rails Supine/Sidelying to sit: Contact guard assist, Used rails, HOB elevated Sit to supine/sidelying: Contact guard assist, HOB elevated, Used rails    Transfers Overall transfer level: Needs assistance Equipment used: None Transfers: Sit to/from Stand Sit to Stand: Supervision      Ambulation/Gait     General Gait Details: Pt doesn't ambulate at baseline.  Home Activity Instructions Home Activity Instructions: Continue to mobilize at home with spouse as able.      Balance Overall  balance assessment: Mild deficits observed, not formally tested Sitting-balance support: Single extremity supported Sitting balance-Leahy Scale: Good     Standing balance support: No upper extremity supported Standing balance-Leahy Scale: Fair Standing balance comment: no overt LOB          Pertinent Vitals/Pain   Pain Assessment Pain Assessment: No/denies pain     Home Living Family/patient expects to be discharged to:: Private residence Living Arrangements: Spouse/significant other Available Help at Discharge: Family;Available 24 hours/day Home Environment: Level entry   Home Equipment: Agricultural consultant (2 wheels);Wheelchair - Advertising account executive;Shower seat   Additional Comments: aide 2 days/week, assist for bathing at bed level; wears 2.5L O2 baseline    Prior Function Level of Independence: Needs assistance Comments: Spouse and aide assist with all ADL"s and mobility    UE/LE Assessment   UE ROM/Strength/Tone/Coordination: Generalized weakness    LE ROM/Strength/Tone/Coordination: Generalized weakness      Communication   Communication Communication: No apparent difficulties     Cognition Overall Cognitive Status: Appears within functional limits for tasks assessed/performed       General Comments General comments (skin integrity, edema, etc.): Pt O2 sats remained between 91-95% dropped to 87% briefly after standing. Pt HR remained around 108 bpm        Assessment/Plan           No Skilled PT Patient at baseline level of functioning;Patient will have necessary level of assist by caregiver at discharge   Co-evaluation PT/OT/SLP Co-Evaluation/Treatment: Yes (due to pt endurance) Reason for Co-Treatment: Complexity of the patient's impairments (multi-system involvement);For patient/therapist safety;To address functional/ADL transfers PT goals addressed during session: Mobility/safety with mobility;Balance  AMPAC 6 Clicks Help needed turning from your back to your side while in a flat bed without using bedrails?: A Little Help needed moving from lying on your back to sitting on the side of a flat bed without using bedrails?: A Little Help needed moving to and from a bed to a chair (including a wheelchair)?: A Little Help needed standing up from a chair using your arms (e.g., wheelchair or bedside chair)?: A Little Help needed to walk in hospital room?: Total Help needed climbing 3-5 steps with a railing? : Total 6 Click Score: 14      End of Session   Activity Tolerance: Patient tolerated treatment well;Other (comment) (pt limited by respiratory status.) Patient left: in bed;with call bell/phone within reach;with bed alarm set Nurse Communication: Mobility status       Time: 1191-4782 PT Time Calculation (min) (ACUTE ONLY): 27 min  Charges:   PT Evaluation $PT Eval Low Complexity: 1 Low      Sloan Duncans, DPT, CLT  Acute Rehabilitation Services Office: [phone] (Secure chat preferred)   Jenice Mitts  08/09/2023, 3:36 PM

## 2023-08-09 NOTE — Progress Notes (Signed)
 PROGRESS NOTE    Kim [name]  NWG:[id number] DOB: 1955/08/01 DOA: 08/06/2023 PCP: [place] Atta, NP   Brief Narrative: 68 year old with past medical history significant for steroid-dependent COPD, chronic hypoxic respiratory failure, chronic combined systolic and diastolic heart failure, Takotsubo cardiomyopathy, anxiety, CKD 3B who presents with respiratory distress.  Reports 2 or 3 days of worsening of her chronic dyspnea with increased cough and sputum production.  Patient was found respiratory distress by EMS, satting 92% on 4 L.  On arrival to the ED she was afebrile oxygen sat 90% on 5 L, tachypnea tachycardia creatinine 2.0 troponin 1000, BNP 1057.  Cardiology was consulted, patient was started on IV heparin    Assessment & Plan:   Principal Problem:   COPD with acute exacerbation (HCC) Active Problems:   Elevated troponin   Essential hypertension   Acute renal failure superimposed on stage 3b chronic kidney disease (HCC)   Acute on chronic combined systolic and diastolic CHF (congestive heart failure) (HCC)   Acute on chronic respiratory failure with hypoxia (HCC)   Acute on chronic hypoxic respiratory failure COPD exacerbation - Did require BiPAP initially, but nothing overnight, oxygen requirement has improved, down to 4 L . - Start Brovana  and Pulmicort  - Start as needed albuterol  every 2 hours as needed - Continue doxycycline  - Continue DuoNeb - IV steroids, on significant dose 125 mg IV twice daily, significantly dyspneic with minimal activity and increased work of breathing, will continue at current dose with no taper.   Continue Xanax  every 4 hours as needed, patient has also component of anxiety - Patient is home with hospice, but she would like to continue current active therapy  Elevation of troponin: In setting of demand ischemia in the setting of COPD - In setting of demand ischemia, due to respiratory failure.  Cardiology input greatly appreciated, IV  heparin  drip discontinued .  Chronic combined systolic and diastolic heart failure - Cardiology does not think that she is markedly over loaded.   - Diuresis per cardiology, she is currently on p.o. Lasix  .  Bisoprolol  changed as needed because she has a history of orthostatic hypotension  AKI on CKD 3B -Baseline Creatinine 1.3  Anxiety: Continue with Xanax  1 mg every 4 hours as needed   Estimated body mass index is 28.04 kg/m as calculated from the following:   Height as of this encounter: 5\' 3"  (1.6 m).   Weight as of this encounter: 71.8 kg.   DVT prophylaxis: Subcu heparin  Code Status: Patient DNR/DNI Family Communication: Care discussed with husband at bedside Disposition Plan:  Status is: Inpatient Remains inpatient appropriate because: Remain inpatient for COPD exacerbation    Consultants:  Palliative care Cardiology  Procedures:  none  Antimicrobials:    Subjective: No BiPAP overnight, but still with significant dyspnea even with minimal activity just standing up from bed, patient reports she still dyspneic not at baseline.  Objective: Vitals:   08/09/23 0527 08/09/23 0710 08/09/23 0808 08/09/23 1200  BP: 127/78  118/64 126/70  Pulse:   93 93  Resp:   17 18  Temp: (!) 97.4 F (36.3 C)  (!) 97.5 F (36.4 C) 97.8 F (36.6 C)  TempSrc: Axillary  Axillary Oral  SpO2:   92% 94%  Weight:  71.8 kg    Height:        Intake/Output Summary (Last 24 hours) at 08/09/2023 1417 Last data filed at 08/09/2023 1000 Gross per 24 hour  Intake 240 ml  Output --  Net 240  ml   Filed Weights   08/06/23 2343 08/08/23 0443 08/09/23 0710  Weight: 68.5 kg 71.5 kg 71.8 kg    Examination:  Awake Alert, Oriented X 3, frail, deconditioned Symmetrical Chest wall movement, creased air entry with wheezing RRR,No Gallops,Rubs or new Murmurs, No Parasternal Heave +ve B.Sounds, Abd Soft, No tenderness, No rebound - guarding or rigidity. No Cyanosis, Clubbing or edema, has  significant bruising from thin skin     Data Reviewed: I have personally reviewed following labs and imaging studies  CBC: Recent Labs  Lab 08/06/23 2342 08/08/23 0457 08/09/23 0640  WBC 24.3* 17.5* 16.1*  NEUTROABS 18.0*  --   --   HGB 12.5 10.8* 10.8*  HCT 40.6 33.9* 34.1*  MCV 101.0* 96.3 97.7  PLT 389 322 307   Basic Metabolic Panel: Recent Labs  Lab 08/06/23 2342 08/08/23 0457 08/09/23 0640  NA 145 137 138  K 3.6 3.7 4.2  CL 100 96* 96*  CO2 27 29 29   GLUCOSE 170* 205* 200*  BUN 28* 34* 44*  CREATININE 2.02* 1.84* 1.65*  CALCIUM  9.6 9.3 9.2  MG  --  2.9* 2.4  PHOS  --   --  3.3   GFR: Estimated Creatinine Clearance: 30.6 mL/min (A) (by C-G formula based on SCr of 1.65 mg/dL (H)). Liver Function Tests: Recent Labs  Lab 08/06/23 2342  AST 33  ALT 20  ALKPHOS 50  BILITOT 0.4  PROT 6.7  ALBUMIN 3.7   No results for input(s): "LIPASE", "AMYLASE" in the last 168 hours. No results for input(s): "AMMONIA" in the last 168 hours. Coagulation Profile: No results for input(s): "INR", "PROTIME" in the last 168 hours. Cardiac Enzymes: No results for input(s): "CKTOTAL", "CKMB", "CKMBINDEX", "TROPONINI" in the last 168 hours. BNP (last 3 results) Recent Labs    04/23/23 1547  PROBNP 337*   HbA1C: No results for input(s): "HGBA1C" in the last 72 hours. CBG: No results for input(s): "GLUCAP" in the last 168 hours. Lipid Profile: No results for input(s): "CHOL", "HDL", "LDLCALC", "TRIG", "CHOLHDL", "LDLDIRECT" in the last 72 hours. Thyroid  Function Tests: No results for input(s): "TSH", "T4TOTAL", "FREET4", "T3FREE", "THYROIDAB" in the last 72 hours. Anemia Panel: No results for input(s): "VITAMINB12", "FOLATE", "FERRITIN", "TIBC", "IRON", "RETICCTPCT" in the last 72 hours. Sepsis Labs: No results for input(s): "PROCALCITON", "LATICACIDVEN" in the last 168 hours.  No results found for this or any previous visit (from the past 240 hours).        Radiology Studies: No results found.       Scheduled Meds:  arformoterol   15 mcg Nebulization BID   aspirin  EC  81 mg Oral Q M,W,F   budesonide  (PULMICORT ) nebulizer solution  0.25 mg Nebulization BID   Chlorhexidine  Gluconate Cloth  6 each Topical Daily   doxycycline   100 mg Oral Q12H   furosemide   20 mg Oral Daily   heparin  injection (subcutaneous)  5,000 Units Subcutaneous Q8H   ipratropium-albuterol   3 mL Nebulization TID   methylPREDNISolone  (SOLU-MEDROL ) injection  125 mg Intravenous BID   pantoprazole   40 mg Oral Daily   PARoxetine   40 mg Oral Daily   rosuvastatin   20 mg Oral Daily   sodium chloride  flush  3 mL Intravenous Q12H   Continuous Infusions:  albuterol  Stopped (08/07/23 1132)     LOS: 2 days       Seena Dadds, MD [facility]   If 7PM-7AM, please contact night-coverage [email or web address]  08/09/2023, 2:17 PM

## 2023-08-09 NOTE — Progress Notes (Signed)
 Palliative Medicine Progress Note   Patient Name: Kim [name]       Date: 08/09/2023 DOB: 05-07-1955  Age: 68 y.o. MRN#: [id number] Attending Physician: Epifanio Haste, MD Primary Care Physician: Alto Atta, NP Admit Date: 08/06/2023   HPI/Patient Profile: 68 y.o. female  with past medical history of advanced COPD, chronic hypoxic respiratory failure, chronic combined systolic and diastolic heart failure, Takotsubo cardiomyopathy, CKD stage IIIb, and anxiety who presented to the ED on 08/06/2023 with respiratory distress.  She reports 2 to 3 days of worsening chronic dyspnea with increased cough and sputum production.  She is admitted with COPD exacerbation, acute on chronic hypoxic respiratory failure, and elevated troponin likely demand ischemia.   Palliative Medicine has been consulted for goals of care.  Subjective: Chart reviewed. Met with patient at bedside. She reports feeling somewhat better.  I spoke with the triage RN from [facility].  She reports that patient was disenrolled from hospice upon admission to the hospital, and that they will need a new referral to reinstate services at discharge.   Objective:  Physical Exam Vitals reviewed.  Constitutional:      General: She is not in acute distress.    Comments: Chronically ill-appearing  Cardiovascular:     Rate and Rhythm: Tachycardia present.  Pulmonary:     Effort: No respiratory distress.  Genitourinary:    Comments: Chronic Foley Neurological:     Mental Status: She is alert and oriented to person, place, and time.             LBM: Last BM Date : 08/07/23   Palliative Medicine Assessment & Plan   Assessment: Principal Problem:   COPD with acute exacerbation (HCC) Active Problems:   Acute on chronic  combined systolic and diastolic CHF (congestive heart failure) (HCC)   Essential hypertension   Acute renal failure superimposed on stage 3b chronic kidney disease (HCC)   Elevated troponin   Acute on chronic respiratory failure with hypoxia (HCC)    Recommendations/Plan: Continue current supportive interventions Goal of care is for medical stabilization and to return home with hospice Patient wishes to reinstate services with [facility] - I have placed a TOC order to make a new referral PMT will continue to follow  Symptom Management:  Morphine  concentrate solution 5 to 10 mg every  4 hours as needed for pain or dyspnea Alprazolam  (xanax ) 1 mg every 4 hours as needed for anxiety or sleep Bowel regimen - MiraLAX  17 g daily and bisacodyl  5 to 10 mg daily as needed   Code Status: DNR - Limited   Prognosis:  < 6 months  Discharge Planning: Home with Hospice  Care plan was discussed with ***  Thank you for allowing the Palliative Medicine Team to assist in the care of this patient.   ***   Wynetta Heckle, NP   Please contact Palliative Medicine Team phone at [phone] for questions and concerns.  For individual providers, please see AMION.

## 2023-08-10 DIAGNOSIS — J441 Chronic obstructive pulmonary disease with (acute) exacerbation: Secondary | ICD-10-CM | POA: Diagnosis not present

## 2023-08-10 DIAGNOSIS — R7989 Other specified abnormal findings of blood chemistry: Secondary | ICD-10-CM | POA: Diagnosis not present

## 2023-08-10 LAB — CBC
HCT: 33.5 % — ABNORMAL LOW (ref 36.0–46.0)
Hemoglobin: 10.8 g/dL — ABNORMAL LOW (ref 12.0–15.0)
MCH: 31.2 pg (ref 26.0–34.0)
MCHC: 32.2 g/dL (ref 30.0–36.0)
MCV: 96.8 fL (ref 80.0–100.0)
Platelets: 317 10*3/uL (ref 150–400)
RBC: 3.46 MIL/uL — ABNORMAL LOW (ref 3.87–5.11)
RDW: 14.3 % (ref 11.5–15.5)
WBC: 15.7 10*3/uL — ABNORMAL HIGH (ref 4.0–10.5)
nRBC: 0.2 % (ref 0.0–0.2)

## 2023-08-10 LAB — BASIC METABOLIC PANEL WITH GFR
Anion gap: 10 (ref 5–15)
BUN: 41 mg/dL — ABNORMAL HIGH (ref 8–23)
CO2: 30 mmol/L (ref 22–32)
Calcium: 9 mg/dL (ref 8.9–10.3)
Chloride: 97 mmol/L — ABNORMAL LOW (ref 98–111)
Creatinine, Ser: 1.49 mg/dL — ABNORMAL HIGH (ref 0.44–1.00)
GFR, Estimated: 38 mL/min — ABNORMAL LOW (ref >60)
Glucose, Bld: 250 mg/dL — ABNORMAL HIGH (ref 70–99)
Potassium: 4.2 mmol/L (ref 3.5–5.1)
Sodium: 137 mmol/L (ref 135–145)

## 2023-08-10 MED ORDER — METHYLPREDNISOLONE SODIUM SUCC 125 MG IJ SOLR
125.0000 mg | Freq: Every day | INTRAMUSCULAR | Status: DC
  Administered 2023-08-10 – 2023-08-11 (×2): 125 mg via INTRAVENOUS
  Filled 2023-08-10 (×2): qty 2

## 2023-08-10 NOTE — TOC CM/SW Note (Signed)
 Transition of Care [facility]) - Inpatient Brief Assessment   Patient Details  Name: JODETTE BRANDWEIN MRN: [id number] Date of Birth: [birth year]  Transition of Care [facility]) CM/SW Contact:    Jannice Mends, LCSW Phone Number: 08/10/2023, 4:56 PM   Clinical Narrative: Patient admitted from home with home hospice. [facility] will require new referral at discharge. TOC continuing to follow.   Transition of Care Asessment: Insurance and Status: Insurance coverage has been reviewed Patient has primary care physician: Yes Home environment has been reviewed: From hom Prior level of function:: Mod Independent Prior/Current Home Services: Current home services Social Drivers of Health Review: SDOH reviewed no interventions necessary Readmission risk has been reviewed: Yes Transition of care needs: transition of care needs identified, TOC will continue to follow

## 2023-08-10 NOTE — Progress Notes (Signed)
 PROGRESS NOTE    Kim [name]  ZOX:[id number] DOB: 04-24-1955 DOA: 08/06/2023 PCP: Alto Atta, NP   Brief Narrative: 68 year old with past medical history significant for steroid-dependent COPD, chronic hypoxic respiratory failure, chronic combined systolic and diastolic heart failure, Takotsubo cardiomyopathy, anxiety, CKD 3B who presents with respiratory distress.  Reports 2 or 3 days of worsening of her chronic dyspnea with increased cough and sputum production.  Patient was found respiratory distress by EMS, satting 92% on 4 L.  On arrival to the ED she was afebrile oxygen sat 90% on 5 L, tachypnea tachycardia creatinine 2.0 troponin 1000, BNP 1057.  Cardiology was consulted, patient was started on IV heparin    Assessment & Plan:   Principal Problem:   COPD with acute exacerbation (HCC) Active Problems:   Elevated troponin   Essential hypertension   Acute renal failure superimposed on stage 3b chronic kidney disease (HCC)   Acute on chronic combined systolic and diastolic CHF (congestive heart failure) (HCC)   Acute on chronic respiratory failure with hypoxia (HCC)   Acute on chronic hypoxic respiratory failure COPD exacerbation - Did require BiPAP initially, but nothing overnight, oxygen requirement has improved, down to 4 L . - Start Brovana  and Pulmicort  - Start as needed albuterol  every 2 hours as needed - Continue doxycycline  - Continue DuoNeb - IV steroids, on significant dose 125 mg IV twice daily, significantly dyspneic with minimal activity and increased work of breathing, but less wheezing today, even though remains quite symptomatic, but I guess at baseline she is symptomatic, so I will decrease to once daily, I have encouraged her to ask for more morphine  for dyspnea as it is ordered already as needed.   Continue Xanax  every 4 hours as needed, patient has also component of anxiety - Patient is home with hospice, but she would like to continue current active  therapy  Elevation of troponin: In setting of demand ischemia in the setting of COPD - In setting of demand ischemia, due to respiratory failure.  Cardiology input greatly appreciated, IV heparin  drip discontinued .  Chronic combined systolic and diastolic heart failure - Cardiology does not think that she is markedly over loaded.   - Diuresis per cardiology, she is currently on p.o. Lasix  .  Bisoprolol  changed as needed because she has a history of orthostatic hypotension  AKI on CKD 3B -Baseline Creatinine 1.3  Anxiety: Continue with Xanax  1 mg every 4 hours as needed   Estimated body mass index is 29.88 kg/m as calculated from the following:   Height as of this encounter: 5\' 3"  (1.6 m).   Weight as of this encounter: 76.5 kg.   DVT prophylaxis: Subcu heparin  Code Status: Patient DNR/DNI Family Communication: Care discussed with husband at bedside Disposition Plan:  Status is: Inpatient Remains inpatient appropriate because: Remain inpatient for COPD exacerbation    Consultants:  Palliative care Cardiology  Procedures:  none  Antimicrobials:    Subjective:  No further BiPAP requirement, remains significantly dyspneic with minimal activity, reports cough with a productive phlegm, yellow/green in color.   Objective: Vitals:   08/10/23 0400 08/10/23 0439 08/10/23 0800 08/10/23 1218  BP: 114/75  (!) 145/76 113/72  Pulse: 90  92 94  Resp: 16  20 18   Temp: (!) 97.5 F (36.4 C)  97.6 F (36.4 C) 97.6 F (36.4 C)  TempSrc: Axillary  Axillary Oral  SpO2: 95%  96% 96%  Weight:  76.5 kg    Height:  Intake/Output Summary (Last 24 hours) at 08/10/2023 1355 Last data filed at 08/10/2023 0914 Gross per 24 hour  Intake 823 ml  Output 1550 ml  Net -727 ml   Filed Weights   08/08/23 0443 08/09/23 0710 08/10/23 0439  Weight: 71.5 kg 71.8 kg 76.5 kg    Examination:  Awake Alert, Oriented X 3, frail, deconditioned Symmetrical Chest wall movement, improved  air entry with less wheezing today RRR,No Gallops,Rubs or new Murmurs, No Parasternal Heave +ve B.Sounds, Abd Soft, No tenderness, No rebound - guarding or rigidity. No Cyanosis, Clubbing or edema, has significant bruising from thin skin     Data Reviewed: I have personally reviewed following labs and imaging studies  CBC: Recent Labs  Lab 08/06/23 2342 08/08/23 0457 08/09/23 0640 08/10/23 0431  WBC 24.3* 17.5* 16.1* 15.7*  NEUTROABS 18.0*  --   --   --   HGB 12.5 10.8* 10.8* 10.8*  HCT 40.6 33.9* 34.1* 33.5*  MCV 101.0* 96.3 97.7 96.8  PLT 389 322 307 317   Basic Metabolic Panel: Recent Labs  Lab 08/06/23 2342 08/08/23 0457 08/09/23 0640 08/10/23 0431  NA 145 137 138 137  K 3.6 3.7 4.2 4.2  CL 100 96* 96* 97*  CO2 27 29 29 30   GLUCOSE 170* 205* 200* 250*  BUN 28* 34* 44* 41*  CREATININE 2.02* 1.84* 1.65* 1.49*  CALCIUM  9.6 9.3 9.2 9.0  MG  --  2.9* 2.4  --   PHOS  --   --  3.3  --    GFR: Estimated Creatinine Clearance: 34.9 mL/min (A) (by C-G formula based on SCr of 1.49 mg/dL (H)). Liver Function Tests: Recent Labs  Lab 08/06/23 2342  AST 33  ALT 20  ALKPHOS 50  BILITOT 0.4  PROT 6.7  ALBUMIN 3.7   No results for input(s): "LIPASE", "AMYLASE" in the last 168 hours. No results for input(s): "AMMONIA" in the last 168 hours. Coagulation Profile: No results for input(s): "INR", "PROTIME" in the last 168 hours. Cardiac Enzymes: No results for input(s): "CKTOTAL", "CKMB", "CKMBINDEX", "TROPONINI" in the last 168 hours. BNP (last 3 results) Recent Labs    04/23/23 1547  PROBNP 337*   HbA1C: No results for input(s): "HGBA1C" in the last 72 hours. CBG: No results for input(s): "GLUCAP" in the last 168 hours. Lipid Profile: No results for input(s): "CHOL", "HDL", "LDLCALC", "TRIG", "CHOLHDL", "LDLDIRECT" in the last 72 hours. Thyroid  Function Tests: No results for input(s): "TSH", "T4TOTAL", "FREET4", "T3FREE", "THYROIDAB" in the last 72  hours. Anemia Panel: No results for input(s): "VITAMINB12", "FOLATE", "FERRITIN", "TIBC", "IRON", "RETICCTPCT" in the last 72 hours. Sepsis Labs: No results for input(s): "PROCALCITON", "LATICACIDVEN" in the last 168 hours.  No results found for this or any previous visit (from the past 240 hours).       Radiology Studies: No results found.       Scheduled Meds:  arformoterol   15 mcg Nebulization BID   aspirin  EC  81 mg Oral Q M,W,F   budesonide  (PULMICORT ) nebulizer solution  0.25 mg Nebulization BID   Chlorhexidine  Gluconate Cloth  6 each Topical Daily   doxycycline   100 mg Oral Q12H   furosemide   20 mg Oral Daily   heparin  injection (subcutaneous)  5,000 Units Subcutaneous Q8H   ipratropium-albuterol   3 mL Nebulization TID   methylPREDNISolone  (SOLU-MEDROL ) injection  125 mg Intravenous Daily   pantoprazole   40 mg Oral Daily   PARoxetine   40 mg Oral Daily   polyethylene glycol  17 g Oral Daily   rosuvastatin   20 mg Oral Daily   sodium chloride  flush  3 mL Intravenous Q12H   Continuous Infusions:  albuterol  Stopped (08/07/23 1132)   cefTRIAXone  (ROCEPHIN )  IV 2 g (08/09/23 1528)     LOS: 3 days       Seena Dadds, MD [facility]   If 7PM-7AM, please contact night-coverage [email or web address]  08/10/2023, 1:55 PM

## 2023-08-10 NOTE — Plan of Care (Signed)

## 2023-08-10 NOTE — Progress Notes (Signed)
 Progress Note  Patient Name: Kim [name] Date of Encounter: 08/10/2023  Primary Cardiologist:   Antoinette Batman, MD   Subjective   No CP  Breathing is stable     Inpatient Medications    Scheduled Meds:  arformoterol   15 mcg Nebulization BID   aspirin  EC  81 mg Oral Q M,W,F   budesonide  (PULMICORT ) nebulizer solution  0.25 mg Nebulization BID   Chlorhexidine  Gluconate Cloth  6 each Topical Daily   doxycycline   100 mg Oral Q12H   furosemide   20 mg Oral Daily   heparin  injection (subcutaneous)  5,000 Units Subcutaneous Q8H   ipratropium-albuterol   3 mL Nebulization TID   methylPREDNISolone  (SOLU-MEDROL ) injection  125 mg Intravenous Daily   pantoprazole   40 mg Oral Daily   PARoxetine   40 mg Oral Daily   polyethylene glycol  17 g Oral Daily   rosuvastatin   20 mg Oral Daily   sodium chloride  flush  3 mL Intravenous Q12H   Continuous Infusions:  albuterol  Stopped (08/07/23 1132)   cefTRIAXone  (ROCEPHIN )  IV 2 g (08/09/23 1528)   PRN Meds: acetaminophen  **OR** acetaminophen , albuterol , ALPRAZolam , bisacodyl , morphine  CONCENTRATE, ondansetron  **OR** ondansetron  (ZOFRAN ) IV, oxyCODONE    Vital Signs    Vitals:   08/09/23 2032 08/10/23 0023 08/10/23 0400 08/10/23 0439  BP:  126/78 114/75   Pulse:  95 90   Resp:  16 16   Temp:  (!) 97.4 F (36.3 C) (!) 97.5 F (36.4 C)   TempSrc:  Oral Axillary   SpO2: 94% 94% 95%   Weight:    76.5 kg  Height:        Intake/Output Summary (Last 24 hours) at 08/10/2023 0806 Last data filed at 08/09/2023 2216 Gross per 24 hour  Intake 1063 ml  Output 900 ml  Net 163 ml   Filed Weights   08/08/23 0443 08/09/23 0710 08/10/23 0439  Weight: 71.5 kg 71.8 kg 76.5 kg    Telemetry    SR/ST   - Personally Reviewed  ECG    NA - Personally Reviewed  Physical Exam   GEN: No acute distress.   Neck: No  JVD Cardiac: RRR, no murmurs Respiratory: Moving air   Occasional rale at bases  GI: Soft, nontender, non-distended  MS:     No signif LE  edema Neuro:  Nonfocal  Psych: Normal affect   Labs    Chemistry Recent Labs  Lab 08/06/23 2342 08/08/23 0457 08/09/23 0640 08/10/23 0431  NA 145 137 138 137  K 3.6 3.7 4.2 4.2  CL 100 96* 96* 97*  CO2 27 29 29 30   GLUCOSE 170* 205* 200* 250*  BUN 28* 34* 44* 41*  CREATININE 2.02* 1.84* 1.65* 1.49*  CALCIUM  9.6 9.3 9.2 9.0  PROT 6.7  --   --   --   ALBUMIN 3.7  --   --   --   AST 33  --   --   --   ALT 20  --   --   --   ALKPHOS 50  --   --   --   BILITOT 0.4  --   --   --   GFRNONAA 26* 29* 33* 38*  ANIONGAP 18* 12 13 10      Hematology Recent Labs  Lab 08/08/23 0457 08/09/23 0640 08/10/23 0431  WBC 17.5* 16.1* 15.7*  RBC 3.52* 3.49* 3.46*  HGB 10.8* 10.8* 10.8*  HCT 33.9* 34.1* 33.5*  MCV 96.3 97.7 96.8  MCH  30.7 30.9 31.2  MCHC 31.9 31.7 32.2  RDW 14.7 14.5 14.3  PLT 322 307 317    Cardiac EnzymesNo results for input(s): "TROPONINI" in the last 168 hours. No results for input(s): "TROPIPOC" in the last 168 hours.   BNP Recent Labs  Lab 08/06/23 2359  BNP 1,057.0*     DDimer No results for input(s): "DDIMER" in the last 168 hours.   Radiology    No results found.   Cardiac Studies   Echo:  May 2025    1. Extremely poor windows even with contrast, exam also limited by  tachycardia. EF appears at most mildly reduced. There is a septal wall  abnormality, but may be due to bundle branch block. Would recommend  repeating when tachycardia improved. Left  ventricular ejection fraction, by estimation, is 50 to 55%. The left  ventricle has low normal function. Left ventricular endocardial border not  optimally defined to evaluate regional wall motion. Left ventricular  diastolic function could not be  evaluated.   2. Right ventricular systolic function is normal. The right ventricular  size is normal.   3. The mitral valve is grossly normal. No evidence of mitral valve  regurgitation. No evidence of mitral stenosis.   4. The  aortic valve is grossly normal. Aortic valve regurgitation is not  visualized. No aortic stenosis is present.   5. The inferior vena cava is normal in size with greater than 50%  respiratory variability, suggesting right atrial pressure of 3 mmHg.    Patient Profile     68 y.o. female with a hx of nonischemic cardiomyopathy with reduced EF, Takotsubo cardiomyopathy ,vasovagal syncope, mild nonobstructive CAD, end-stage COPD, hyperlipidemia, hypertension, CKD, who is being seen 08/07/2023 for the evaluation of elevated trops at the request of Dr. Brice Campi.   Assessment & Plan    1  Elevated troponin:  Not thought to be related to acute coronary syndrome.Felt demand in setting of COPD exacerbation.  LVEF low normal, improved from previous    Heparin  stopped    2  COPD End stage  Pt with exacerbation on admit  Now on O2   Hospice is following     CAD:Hx of Takotsubos CM     Non obstructive. CAD in past      Elevated trop in current admit felt due to demand ischemia     LVEF OK    Follow clinically     Hx of Takotsubo's Cardiomyopathy (2017, 2022):  Current LVEF appears to be low normal.  Volume status OK on current regimen  Note hospice nurses could evaluate with exam, weights.  Give additional lasix  as needed    Hyperlipidemia:  Continue statin.     DNR  Pt is followed by hospice Will be available as needed for appts if hospice calls    Pt says it is very difficult for her to get out to appts    Again, weights with PRN lasix  will be important  Will sign off   Please call with questions.   For questions or updates, please contact [facility] Please consult [email or web address] for contact info under Cardiology/STEMI.   Signed, Ola Berger, MD  08/10/2023, 8:06 AM

## 2023-08-11 DIAGNOSIS — J441 Chronic obstructive pulmonary disease with (acute) exacerbation: Secondary | ICD-10-CM | POA: Diagnosis not present

## 2023-08-11 LAB — CBC
HCT: 34.9 % — ABNORMAL LOW (ref 36.0–46.0)
Hemoglobin: 11.4 g/dL — ABNORMAL LOW (ref 12.0–15.0)
MCH: 31.4 pg (ref 26.0–34.0)
MCHC: 32.7 g/dL (ref 30.0–36.0)
MCV: 96.1 fL (ref 80.0–100.0)
Platelets: 324 10*3/uL (ref 150–400)
RBC: 3.63 MIL/uL — ABNORMAL LOW (ref 3.87–5.11)
RDW: 14.2 % (ref 11.5–15.5)
WBC: 19 10*3/uL — ABNORMAL HIGH (ref 4.0–10.5)
nRBC: 0.1 % (ref 0.0–0.2)

## 2023-08-11 LAB — BASIC METABOLIC PANEL WITH GFR
Anion gap: 11 (ref 5–15)
BUN: 35 mg/dL — ABNORMAL HIGH (ref 8–23)
CO2: 29 mmol/L (ref 22–32)
Calcium: 8.7 mg/dL — ABNORMAL LOW (ref 8.9–10.3)
Chloride: 99 mmol/L (ref 98–111)
Creatinine, Ser: 1.27 mg/dL — ABNORMAL HIGH (ref 0.44–1.00)
GFR, Estimated: 46 mL/min — ABNORMAL LOW (ref >60)
Glucose, Bld: 225 mg/dL — ABNORMAL HIGH (ref 70–99)
Potassium: 4.4 mmol/L (ref 3.5–5.1)
Sodium: 139 mmol/L (ref 135–145)

## 2023-08-11 MED ORDER — ALBUTEROL SULFATE HFA 108 (90 BASE) MCG/ACT IN AERS
2.0000 | INHALATION_SPRAY | Freq: Four times a day (QID) | RESPIRATORY_TRACT | Status: DC | PRN
  Filled 2023-08-11: qty 6.7

## 2023-08-11 MED ORDER — ALBUTEROL SULFATE (2.5 MG/3ML) 0.083% IN NEBU: 2.5000 mg | INHALATION_SOLUTION | RESPIRATORY_TRACT | Status: DC | PRN

## 2023-08-11 MED ORDER — MAGIC MOUTHWASH
5.0000 mL | Freq: Four times a day (QID) | ORAL | Status: DC
  Administered 2023-08-11 – 2023-08-13 (×9): 5 mL via ORAL
  Filled 2023-08-11 (×12): qty 5

## 2023-08-11 MED ORDER — CARMEX CLASSIC LIP BALM EX OINT
1.0000 | TOPICAL_OINTMENT | CUTANEOUS | Status: DC | PRN
  Filled 2023-08-11: qty 10

## 2023-08-11 NOTE — Plan of Care (Signed)

## 2023-08-11 NOTE — Progress Notes (Signed)
 PROGRESS NOTE    Kim [name]  ZOX:[id number] DOB: 1956/03/27 DOA: 08/06/2023 PCP: Alto Atta, NP   Brief Narrative:  68 year old with past medical history significant for steroid-dependent COPD, chronic hypoxic respiratory failure, chronic combined systolic and diastolic heart failure, Takotsubo cardiomyopathy, anxiety, CKD 3B who presents with respiratory distress.  Reports 2 or 3 days of worsening of her chronic dyspnea with increased cough and sputum production.  Patient was found respiratory distress by EMS, satting 92% on 4 L.  On arrival to the ED she was afebrile oxygen sat 90% on 5 L, tachypnea tachycardia creatinine 2.0 troponin 1000, BNP 1057.  Cardiology was consulted, patient was started on IV heparin , upon cardiology evaluation it felt secondary to demand ischemia where heparin  drip was discontinued, she was with significant respiratory distress and wheezing, treated for COPD exacerbation   Assessment & Plan:   Principal Problem:   COPD with acute exacerbation (HCC) Active Problems:   Elevated troponin   Essential hypertension   Acute renal failure superimposed on stage 3b chronic kidney disease (HCC)   Acute on chronic combined systolic and diastolic CHF (congestive heart failure) (HCC)   Acute on chronic respiratory failure with hypoxia (HCC)   Acute on chronic hypoxic respiratory failure COPD exacerbation - Did require BiPAP initially, but nothing overnight, oxygen requirement has improved, down to 4 L . - Start Brovana  and Pulmicort  - Start as needed albuterol  every 2 hours as needed - Continue doxycycline  - Continue DuoNeb - No further tapering of IV Solu-Medrol , continue at 125 mg IV daily as it was just tapered today, remains significantly dyspneic, I will anticipate he will need another 2 to 3 days at least on IV steroids with very likely high-dose prednisone  upon transition.  . -I have encouraged her to ask for more morphine  for dyspnea as it is ordered  already as needed.   Continue Xanax  every 4 hours as needed, patient has also component of anxiety - Patient is home with hospice, but she would like to continue current active therapy  Elevation of troponin: In setting of demand ischemia in the setting of COPD - In setting of demand ischemia, due to respiratory failure.  Cardiology input greatly appreciated, IV heparin  drip discontinued .  Chronic combined systolic and diastolic heart failure - Cardiology does not think that she is markedly over loaded.   - Diuresis per cardiology, she is currently on p.o. Lasix  .  Bisoprolol  changed as needed because she has a history of orthostatic hypotension  AKI on CKD 3B -Baseline Creatinine 1.3  Anxiety: Continue with Xanax  1 mg every 4 hours as needed   Estimated body mass index is 29.41 kg/m as calculated from the following:   Height as of this encounter: 5\' 3"  (1.6 m).   Weight as of this encounter: 75.3 kg.   DVT prophylaxis: Subcu heparin  Code Status: Patient DNR/DNI Family Communication: Care discussed with husband at bedside Disposition Plan:  Status is: Inpatient Remains inpatient appropriate because: Remain inpatient for COPD exacerbation    Consultants:  Palliative care Cardiology  Procedures:  none  Antimicrobials:    Subjective:  Patient reports dyspnea, suprapubic pain, she had some retention despite having Foley as she pulled on it, she has 500 cc output after Foley was flushed and readjusted.   Objective: Vitals:   08/11/23 0741 08/11/23 0811 08/11/23 1200 08/11/23 1447  BP: (!) 158/69  137/65   Pulse: 85  89   Resp: (!) 21  14   Temp: 97.6  F (36.4 C)     TempSrc: Oral     SpO2: 97% 97% 97% 95%  Weight:      Height:        Intake/Output Summary (Last 24 hours) at 08/11/2023 1537 Last data filed at 08/11/2023 1526 Gross per 24 hour  Intake 30 ml  Output 1375 ml  Net -1345 ml   Filed Weights   08/09/23 0710 08/10/23 0439 08/11/23 0431  Weight:  71.8 kg 76.5 kg 75.3 kg    Examination:  Awake Alert, Oriented X 3, frail, deconditioned Symmetrical Chest wall movement, Good air movement bilaterally, wheezing improved RRR,No Gallops,Rubs or new Murmurs, No Parasternal Heave +ve B.Sounds, Abd Soft, No tenderness, No rebound - guarding or rigidity. No Cyanosis, Clubbing or edema, No new Rash or bruise       Data Reviewed: I have personally reviewed following labs and imaging studies  CBC: Recent Labs  Lab 08/06/23 2342 08/08/23 0457 08/09/23 0640 08/10/23 0431 08/11/23 0436  WBC 24.3* 17.5* 16.1* 15.7* 19.0*  NEUTROABS 18.0*  --   --   --   --   HGB 12.5 10.8* 10.8* 10.8* 11.4*  HCT 40.6 33.9* 34.1* 33.5* 34.9*  MCV 101.0* 96.3 97.7 96.8 96.1  PLT 389 322 307 317 324   Basic Metabolic Panel: Recent Labs  Lab 08/06/23 2342 08/08/23 0457 08/09/23 0640 08/10/23 0431 08/11/23 0436  NA 145 137 138 137 139  K 3.6 3.7 4.2 4.2 4.4  CL 100 96* 96* 97* 99  CO2 27 29 29 30 29   GLUCOSE 170* 205* 200* 250* 225*  BUN 28* 34* 44* 41* 35*  CREATININE 2.02* 1.84* 1.65* 1.49* 1.27*  CALCIUM  9.6 9.3 9.2 9.0 8.7*  MG  --  2.9* 2.4  --   --   PHOS  --   --  3.3  --   --    GFR: Estimated Creatinine Clearance: 40.7 mL/min (A) (by C-G formula based on SCr of 1.27 mg/dL (H)). Liver Function Tests: Recent Labs  Lab 08/06/23 2342  AST 33  ALT 20  ALKPHOS 50  BILITOT 0.4  PROT 6.7  ALBUMIN 3.7   No results for input(s): "LIPASE", "AMYLASE" in the last 168 hours. No results for input(s): "AMMONIA" in the last 168 hours. Coagulation Profile: No results for input(s): "INR", "PROTIME" in the last 168 hours. Cardiac Enzymes: No results for input(s): "CKTOTAL", "CKMB", "CKMBINDEX", "TROPONINI" in the last 168 hours. BNP (last 3 results) Recent Labs    04/23/23 1547  PROBNP 337*   HbA1C: No results for input(s): "HGBA1C" in the last 72 hours. CBG: No results for input(s): "GLUCAP" in the last 168 hours. Lipid  Profile: No results for input(s): "CHOL", "HDL", "LDLCALC", "TRIG", "CHOLHDL", "LDLDIRECT" in the last 72 hours. Thyroid  Function Tests: No results for input(s): "TSH", "T4TOTAL", "FREET4", "T3FREE", "THYROIDAB" in the last 72 hours. Anemia Panel: No results for input(s): "VITAMINB12", "FOLATE", "FERRITIN", "TIBC", "IRON", "RETICCTPCT" in the last 72 hours. Sepsis Labs: No results for input(s): "PROCALCITON", "LATICACIDVEN" in the last 168 hours.  No results found for this or any previous visit (from the past 240 hours).       Radiology Studies: No results found.       Scheduled Meds:  arformoterol   15 mcg Nebulization BID   aspirin  EC  81 mg Oral Q M,W,F   budesonide  (PULMICORT ) nebulizer solution  0.25 mg Nebulization BID   Chlorhexidine  Gluconate Cloth  6 each Topical Daily   doxycycline   100 mg  Oral Q12H   furosemide   20 mg Oral Daily   heparin  injection (subcutaneous)  5,000 Units Subcutaneous Q8H   ipratropium-albuterol   3 mL Nebulization TID   magic mouthwash  5 mL Oral QID   methylPREDNISolone  (SOLU-MEDROL ) injection  125 mg Intravenous Daily   pantoprazole   40 mg Oral Daily   PARoxetine   40 mg Oral Daily   polyethylene glycol  17 g Oral Daily   rosuvastatin   20 mg Oral Daily   sodium chloride  flush  3 mL Intravenous Q12H   Continuous Infusions:  albuterol  Stopped (08/07/23 1132)   cefTRIAXone  (ROCEPHIN )  IV 2 g (08/10/23 1550)     LOS: 4 days       Seena Dadds, MD [facility]   If 7PM-7AM, please contact night-coverage [email or web address]  08/11/2023, 3:37 PM

## 2023-08-12 DIAGNOSIS — R7989 Other specified abnormal findings of blood chemistry: Secondary | ICD-10-CM | POA: Diagnosis not present

## 2023-08-12 DIAGNOSIS — I10 Essential (primary) hypertension: Secondary | ICD-10-CM | POA: Diagnosis not present

## 2023-08-12 DIAGNOSIS — J441 Chronic obstructive pulmonary disease with (acute) exacerbation: Secondary | ICD-10-CM | POA: Diagnosis not present

## 2023-08-12 DIAGNOSIS — N179 Acute kidney failure, unspecified: Secondary | ICD-10-CM | POA: Diagnosis not present

## 2023-08-12 LAB — CBC
HCT: 34.6 % — ABNORMAL LOW (ref 36.0–46.0)
Hemoglobin: 11.1 g/dL — ABNORMAL LOW (ref 12.0–15.0)
MCH: 30.9 pg (ref 26.0–34.0)
MCHC: 32.1 g/dL (ref 30.0–36.0)
MCV: 96.4 fL (ref 80.0–100.0)
Platelets: 307 10*3/uL (ref 150–400)
RBC: 3.59 MIL/uL — ABNORMAL LOW (ref 3.87–5.11)
RDW: 14.3 % (ref 11.5–15.5)
WBC: 17.4 10*3/uL — ABNORMAL HIGH (ref 4.0–10.5)
nRBC: 0.2 % (ref 0.0–0.2)

## 2023-08-12 MED ORDER — METHYLPREDNISOLONE SODIUM SUCC 125 MG IJ SOLR
60.0000 mg | Freq: Every day | INTRAMUSCULAR | Status: DC
  Administered 2023-08-12 – 2023-08-14 (×3): 60 mg via INTRAVENOUS
  Filled 2023-08-12 (×3): qty 2

## 2023-08-12 NOTE — Plan of Care (Signed)

## 2023-08-12 NOTE — Progress Notes (Signed)
 PROGRESS NOTE        PATIENT DETAILS Name: Kim [name] Age: 68 y.o. Sex: female Date of Birth: 10/31/1955 Admit Date: 08/06/2023 Admitting Physician Walton Guppy, MD [id number], Randel Buss, NP  Brief Summary: Patient is a 68 y.o.  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  Significant events: 5/1>> admit to [facility]  Significant studies: 5/2>> CT chest: No acute findings. 5/2>> echo: EF mildly reduced-probably 50-55% by estimation-poor study.  Significant microbiology data: None  Procedures: None  Consults: Cardiology Palliative care  Subjective: Feeling better-lying flat-less shortness of breath.  Spouse sleeping at bedside.  Objective: Vitals: Blood pressure 132/62, pulse 77, temperature 97.6 F (36.4 C), temperature source Axillary, resp. rate 16, height 5\' 3"  (1.6 m), weight 76 kg, SpO2 96%.   Exam: Gen Exam:Alert awake-not in any distress HEENT:atraumatic, normocephalic Chest: B/L clear to auscultation anteriorly CVS:S1S2 regular Abdomen:soft non tender, non distended Extremities:no edema Neurology: Non focal Skin: no rash  Pertinent Labs/Radiology:    Latest Ref Rng & Units 08/12/2023    4:42 AM 08/11/2023    4:36 AM 08/10/2023    4:31 AM  CBC  WBC 4.0 - 10.5 K/uL 17.4  19.0  15.7   Hemoglobin 12.0 - 15.0 g/dL 13.0  86.5  78.4   Hematocrit 36.0 - 46.0 % 34.6  34.9  33.5   Platelets 150 - 400 K/uL 307  324  317     Lab Results  Component Value Date   NA 139 08/11/2023   K 4.4 08/11/2023   CL 99 08/11/2023   CO2 29 08/11/2023      Assessment/Plan: Acute on chronic hypoxic respiratory failure secondary to COPD exacerbation Clinically improved Lungs relatively clear to auscultation this morning Anxiety is a significant component Start tapering Solu-Medrol  (on chronic prednisone  10 mg  daily) Continue bronchodilators Suspect we can stop Rocephin /doxycycline  Mobilize with PT/OT/nursing staff Encourage use of incentive spirometry/flutter valve  Chronic combined HFpEF/HFrEF Euvolemic Lasix   Elevated troponin Demand ischemia Echo with low normal EF No further workup recommended by cardiology.  AKI on CKD stage IIIb AKI hemodynamically mediated Creatinine back to baseline  Anxiety Significant component and dyspnea Continue scheduled Paxil  Continue as needed Xanax .  Palliative care DNR in place End-stage COPD-on home hospice.  Code status:   Code Status: Limited: Do not attempt resuscitation (DNR) -DNR-LIMITED -Do Not Intubate/DNI    DVT Prophylaxis: Place and maintain sequential compression device Start: 08/08/23 1440 heparin  injection 5,000 Units Start: 08/07/23 1400   Family Communication: None at bedside   Disposition Plan: Status is: Inpatient Remains inpatient appropriate because: Severity of illness.   Planned Discharge Destination:Home   Diet: Diet Order             Diet regular Room service appropriate? Yes; Fluid consistency: Thin  Diet effective now                     Antimicrobial agents: Anti-infectives (From admission, onward)    Start     Dose/Rate Route Frequency Ordered Stop   08/09/23 1600  cefTRIAXone  (ROCEPHIN ) 2 g in sodium chloride  0.9 % 100 mL IVPB        2 g 200 mL/hr over 30 Minutes Intravenous Every 24 hours 08/09/23 1508 08/14/23 1559  08/07/23 0600  doxycycline  (VIBRA -TABS) tablet 100 mg        100 mg Oral Every 12 hours 08/07/23 0540 08/11/23 2054        MEDICATIONS: Scheduled Meds:  arformoterol   15 mcg Nebulization BID   aspirin  EC  81 mg Oral Q M,W,F   budesonide  (PULMICORT ) nebulizer solution  0.25 mg Nebulization BID   Chlorhexidine  Gluconate Cloth  6 each Topical Daily   furosemide   20 mg Oral Daily   heparin  injection (subcutaneous)  5,000 Units Subcutaneous Q8H   ipratropium-albuterol    3 mL Nebulization TID   magic mouthwash  5 mL Oral QID   methylPREDNISolone  (SOLU-MEDROL ) injection  125 mg Intravenous Daily   pantoprazole   40 mg Oral Daily   PARoxetine   40 mg Oral Daily   polyethylene glycol  17 g Oral Daily   rosuvastatin   20 mg Oral Daily   sodium chloride  flush  3 mL Intravenous Q12H   Continuous Infusions:  albuterol  Stopped (08/07/23 1132)   cefTRIAXone  (ROCEPHIN )  IV 2 g (08/11/23 1708)   PRN Meds:.acetaminophen  **OR** acetaminophen , albuterol , albuterol , ALPRAZolam , bisacodyl , lip balm, morphine  CONCENTRATE, ondansetron  **OR** ondansetron  (ZOFRAN ) IV, oxyCODONE    I have personally reviewed following labs and imaging studies  LABORATORY DATA: CBC: Recent Labs  Lab 08/06/23 2342 08/08/23 0457 08/09/23 0640 08/10/23 0431 08/11/23 0436 08/12/23 0442  WBC 24.3* 17.5* 16.1* 15.7* 19.0* 17.4*  NEUTROABS 18.0*  --   --   --   --   --   HGB 12.5 10.8* 10.8* 10.8* 11.4* 11.1*  HCT 40.6 33.9* 34.1* 33.5* 34.9* 34.6*  MCV 101.0* 96.3 97.7 96.8 96.1 96.4  PLT 389 322 307 317 324 307    Basic Metabolic Panel: Recent Labs  Lab 08/06/23 2342 08/08/23 0457 08/09/23 0640 08/10/23 0431 08/11/23 0436  NA 145 137 138 137 139  K 3.6 3.7 4.2 4.2 4.4  CL 100 96* 96* 97* 99  CO2 27 29 29 30 29   GLUCOSE 170* 205* 200* 250* 225*  BUN 28* 34* 44* 41* 35*  CREATININE 2.02* 1.84* 1.65* 1.49* 1.27*  CALCIUM  9.6 9.3 9.2 9.0 8.7*  MG  --  2.9* 2.4  --   --   PHOS  --   --  3.3  --   --     GFR: Estimated Creatinine Clearance: 40.8 mL/min (A) (by C-G formula based on SCr of 1.27 mg/dL (H)).  Liver Function Tests: Recent Labs  Lab 08/06/23 2342  AST 33  ALT 20  ALKPHOS 50  BILITOT 0.4  PROT 6.7  ALBUMIN 3.7   No results for input(s): "LIPASE", "AMYLASE" in the last 168 hours. No results for input(s): "AMMONIA" in the last 168 hours.  Coagulation Profile: No results for input(s): "INR", "PROTIME" in the last 168 hours.  Cardiac Enzymes: No results  for input(s): "CKTOTAL", "CKMB", "CKMBINDEX", "TROPONINI" in the last 168 hours.  BNP (last 3 results) Recent Labs    04/23/23 1547  PROBNP 337*    Lipid Profile: No results for input(s): "CHOL", "HDL", "LDLCALC", "TRIG", "CHOLHDL", "LDLDIRECT" in the last 72 hours.  Thyroid  Function Tests: No results for input(s): "TSH", "T4TOTAL", "FREET4", "T3FREE", "THYROIDAB" in the last 72 hours.  Anemia Panel: No results for input(s): "VITAMINB12", "FOLATE", "FERRITIN", "TIBC", "IRON", "RETICCTPCT" in the last 72 hours.  Urine analysis:    Component Value Date/Time   COLORURINE YELLOW 08/07/2023 0648   APPEARANCEUR CLEAR 08/07/2023 0648   LABSPEC 1.011 08/07/2023 0648   PHURINE 5.0 08/07/2023 1610  GLUCOSEU NEGATIVE 08/07/2023 0648   HGBUR MODERATE (A) 08/07/2023 0648   HGBUR trace-lysed 02/26/2010 0951   BILIRUBINUR NEGATIVE 08/07/2023 0648   BILIRUBINUR negative 04/29/2021 1243   KETONESUR NEGATIVE 08/07/2023 0648   PROTEINUR NEGATIVE 08/07/2023 0648   UROBILINOGEN 0.2 04/29/2021 1243   UROBILINOGEN 0.2 02/26/2010 0951   NITRITE NEGATIVE 08/07/2023 0648   LEUKOCYTESUR SMALL (A) 08/07/2023 0648    Sepsis Labs: Lactic Acid, Venous    Component Value Date/Time   LATICACIDVEN 1.6 05/11/2023 0756    MICROBIOLOGY: No results found for this or any previous visit (from the past 240 hours).  RADIOLOGY STUDIES/RESULTS: No results found.   LOS: 5 days   Kimberly Penna, MD  [facility]    To contact the attending provider between 7A-7P or the covering provider during after hours 7P-7A, please log into the web site [email or web address] and access using universal [place] password for that web site. If you do not have the password, please call the hospital operator.  08/12/2023, 9:48 AM

## 2023-08-12 NOTE — Plan of Care (Signed)
  Problem: Education: Goal: Knowledge of General Education information will improve Description: Including pain rating scale, medication(s)/side effects and non-pharmacologic comfort measures Outcome: Progressing   Problem: Coping: Goal: Level of anxiety will decrease Outcome: Progressing   Problem: Elimination: Goal: Will not experience complications related to urinary retention Outcome: Progressing   

## 2023-08-12 NOTE — Care Management Important Message (Signed)
 Important Message  Patient Details  Name: Kim [name] MRN: [id number] Date of Birth: [birth year]   Important Message Given:  Yes - Medicare IM     Wynonia Hedges 08/12/2023, 4:08 PM

## 2023-08-13 DIAGNOSIS — J441 Chronic obstructive pulmonary disease with (acute) exacerbation: Secondary | ICD-10-CM | POA: Diagnosis not present

## 2023-08-13 DIAGNOSIS — N179 Acute kidney failure, unspecified: Secondary | ICD-10-CM | POA: Diagnosis not present

## 2023-08-13 DIAGNOSIS — R7989 Other specified abnormal findings of blood chemistry: Secondary | ICD-10-CM | POA: Diagnosis not present

## 2023-08-13 DIAGNOSIS — I10 Essential (primary) hypertension: Secondary | ICD-10-CM | POA: Diagnosis not present

## 2023-08-13 LAB — BASIC METABOLIC PANEL WITH GFR
Anion gap: 9 (ref 5–15)
BUN: 28 mg/dL — ABNORMAL HIGH (ref 8–23)
CO2: 32 mmol/L (ref 22–32)
Calcium: 9.4 mg/dL (ref 8.9–10.3)
Chloride: 97 mmol/L — ABNORMAL LOW (ref 98–111)
Creatinine, Ser: 1.22 mg/dL — ABNORMAL HIGH (ref 0.44–1.00)
GFR, Estimated: 48 mL/min — ABNORMAL LOW (ref >60)
Glucose, Bld: 131 mg/dL — ABNORMAL HIGH (ref 70–99)
Potassium: 5 mmol/L (ref 3.5–5.1)
Sodium: 138 mmol/L (ref 135–145)

## 2023-08-13 LAB — CBC
HCT: 37.2 % (ref 36.0–46.0)
Hemoglobin: 11.8 g/dL — ABNORMAL LOW (ref 12.0–15.0)
MCH: 30.9 pg (ref 26.0–34.0)
MCHC: 31.7 g/dL (ref 30.0–36.0)
MCV: 97.4 fL (ref 80.0–100.0)
Platelets: 310 10*3/uL (ref 150–400)
RBC: 3.82 MIL/uL — ABNORMAL LOW (ref 3.87–5.11)
RDW: 14.3 % (ref 11.5–15.5)
WBC: 20.9 10*3/uL — ABNORMAL HIGH (ref 4.0–10.5)
nRBC: 0.2 % (ref 0.0–0.2)

## 2023-08-13 MED ORDER — FUROSEMIDE 40 MG PO TABS: 40.0000 mg | ORAL_TABLET | Freq: Every day | ORAL | Status: DC

## 2023-08-13 MED ORDER — FUROSEMIDE 40 MG PO TABS
40.0000 mg | ORAL_TABLET | Freq: Every day | ORAL | Status: DC
  Administered 2023-08-13: 40 mg via ORAL
  Filled 2023-08-13: qty 1

## 2023-08-13 MED ORDER — ALUM & MAG HYDROXIDE-SIMETH 200-200-20 MG/5ML PO SUSP: 15.0000 mL | Freq: Once | ORAL | Status: DC | PRN

## 2023-08-13 NOTE — Plan of Care (Signed)

## 2023-08-13 NOTE — Plan of Care (Signed)

## 2023-08-13 NOTE — Progress Notes (Addendum)
 PROGRESS NOTE        PATIENT DETAILS Name: Kim [name] Age: 68 y.o. Sex: female Date of Birth: 1955-10-25 Admit Date: 08/06/2023 Admitting Physician Walton Guppy, MD [id number], Randel Buss, NP  Brief Summary: Patient is a 68 y.o.  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  Significant events: 5/1>> admit to [facility]  Significant studies: 5/2>> CT chest: No acute findings. 5/2>> echo: EF mildly reduced-probably 50-55% by estimation-poor study.  Significant microbiology data: None  Procedures: None  Consults: Cardiology Palliative care  Subjective: Feels better-no major issues overnight.  Objective: Vitals: Blood pressure 127/62, pulse 92, temperature (!) 97.5 F (36.4 C), temperature source Oral, resp. rate 16, height 5\' 3"  (1.6 m), weight 76.8 kg, SpO2 96%.   Exam: Gen Exam:Alert awake-not in any distress HEENT:atraumatic, normocephalic Chest: B/L clear to auscultation anteriorly CVS:S1S2 regular Abdomen:soft non tender, non distended Extremities:no edema Neurology: Non focal Skin: no rash  Pertinent Labs/Radiology:    Latest Ref Rng & Units 08/13/2023    5:18 AM 08/12/2023    4:42 AM 08/11/2023    4:36 AM  CBC  WBC 4.0 - 10.5 K/uL 20.9  17.4  19.0   Hemoglobin 12.0 - 15.0 g/dL 32.2  02.5  42.7   Hematocrit 36.0 - 46.0 % 37.2  34.6  34.9   Platelets 150 - 400 K/uL 310  307  324     Lab Results  Component Value Date   NA 138 08/13/2023   K 5.0 08/13/2023   CL 97 (L) 08/13/2023   CO2 32 08/13/2023      Assessment/Plan: Acute on chronic hypoxic respiratory failure secondary to COPD exacerbation Slowly improving Anxiety remains a significant component Switch to prednisone -will taper by 10 mg every week-until she is back to her usual dose of prednisone  10 mg daily Continue bronchodilators Continue  anxiolytics If clinical improvement continues-Home 5/9.    Chronic combined HFpEF/HFrEF Euvolemic Lasix   Elevated troponin Demand ischemia Echo with low normal EF No further workup recommended by cardiology.  AKI on CKD stage IIIb AKI hemodynamically mediated Creatinine back to baseline  Anxiety Significant component and dyspnea Continue scheduled Paxil  Continue as needed Xanax .  History of urinary retention with chronic Foley indwelling catheter Claims has had catheter in place-for the past 1.5-2 months.  Palliative care DNR in place End-stage COPD-on home hospice.  Code status:   Code Status: Limited: Do not attempt resuscitation (DNR) -DNR-LIMITED -Do Not Intubate/DNI    DVT Prophylaxis: Place and maintain sequential compression device Start: 08/08/23 1440 heparin  injection 5,000 Units Start: 08/07/23 1400   Family Communication: None at bedside   Disposition Plan: Status is: Inpatient Remains inpatient appropriate because: Severity of illness.   Planned Discharge Destination:Home   Diet: Diet Order             Diet regular Room service appropriate? Yes; Fluid consistency: Thin  Diet effective now                     Antimicrobial agents: Anti-infectives (From admission, onward)    Start     Dose/Rate Route Frequency Ordered Stop   08/09/23 1600  cefTRIAXone  (ROCEPHIN ) 2 g in sodium chloride  0.9 % 100 mL IVPB  Status:  Discontinued  2 g 200 mL/hr over 30 Minutes Intravenous Every 24 hours 08/09/23 1508 08/12/23 0957   08/07/23 0600  doxycycline  (VIBRA -TABS) tablet 100 mg        100 mg Oral Every 12 hours 08/07/23 0540 08/11/23 2054        MEDICATIONS: Scheduled Meds:  arformoterol   15 mcg Nebulization BID   aspirin  EC  81 mg Oral Q M,W,F   budesonide  (PULMICORT ) nebulizer solution  0.25 mg Nebulization BID   Chlorhexidine  Gluconate Cloth  6 each Topical Daily   furosemide   40 mg Oral QHS   heparin  injection (subcutaneous)  5,000  Units Subcutaneous Q8H   ipratropium-albuterol   3 mL Nebulization TID   magic mouthwash  5 mL Oral QID   methylPREDNISolone  (SOLU-MEDROL ) injection  60 mg Intravenous Daily   pantoprazole   40 mg Oral Daily   PARoxetine   40 mg Oral Daily   polyethylene glycol  17 g Oral Daily   rosuvastatin   20 mg Oral Daily   sodium chloride  flush  3 mL Intravenous Q12H   Continuous Infusions:  albuterol  Stopped (08/07/23 1132)   PRN Meds:.acetaminophen  **OR** acetaminophen , albuterol , albuterol , ALPRAZolam , alum & mag hydroxide-simeth, bisacodyl , lip balm, morphine  CONCENTRATE, ondansetron  **OR** ondansetron  (ZOFRAN ) IV, oxyCODONE    I have personally reviewed following labs and imaging studies  LABORATORY DATA: CBC: Recent Labs  Lab 08/06/23 2342 08/08/23 0457 08/09/23 0640 08/10/23 0431 08/11/23 0436 08/12/23 0442 08/13/23 0518  WBC 24.3*   < > 16.1* 15.7* 19.0* 17.4* 20.9*  NEUTROABS 18.0*  --   --   --   --   --   --   HGB 12.5   < > 10.8* 10.8* 11.4* 11.1* 11.8*  HCT 40.6   < > 34.1* 33.5* 34.9* 34.6* 37.2  MCV 101.0*   < > 97.7 96.8 96.1 96.4 97.4  PLT 389   < > 307 317 324 307 310   < > = values in this interval not displayed.    Basic Metabolic Panel: Recent Labs  Lab 08/08/23 0457 08/09/23 0640 08/10/23 0431 08/11/23 0436 08/13/23 0518  NA 137 138 137 139 138  K 3.7 4.2 4.2 4.4 5.0  CL 96* 96* 97* 99 97*  CO2 29 29 30 29  32  GLUCOSE 205* 200* 250* 225* 131*  BUN 34* 44* 41* 35* 28*  CREATININE 1.84* 1.65* 1.49* 1.27* 1.22*  CALCIUM  9.3 9.2 9.0 8.7* 9.4  MG 2.9* 2.4  --   --   --   PHOS  --  3.3  --   --   --     GFR: Estimated Creatinine Clearance: 42.7 mL/min (A) (by C-G formula based on SCr of 1.22 mg/dL (H)).  Liver Function Tests: Recent Labs  Lab 08/06/23 2342  AST 33  ALT 20  ALKPHOS 50  BILITOT 0.4  PROT 6.7  ALBUMIN 3.7   No results for input(s): "LIPASE", "AMYLASE" in the last 168 hours. No results for input(s): "AMMONIA" in the last 168  hours.  Coagulation Profile: No results for input(s): "INR", "PROTIME" in the last 168 hours.  Cardiac Enzymes: No results for input(s): "CKTOTAL", "CKMB", "CKMBINDEX", "TROPONINI" in the last 168 hours.  BNP (last 3 results) Recent Labs    04/23/23 1547  PROBNP 337*    Lipid Profile: No results for input(s): "CHOL", "HDL", "LDLCALC", "TRIG", "CHOLHDL", "LDLDIRECT" in the last 72 hours.  Thyroid  Function Tests: No results for input(s): "TSH", "T4TOTAL", "FREET4", "T3FREE", "THYROIDAB" in the last 72 hours.  Anemia Panel: No  results for input(s): "VITAMINB12", "FOLATE", "FERRITIN", "TIBC", "IRON", "RETICCTPCT" in the last 72 hours.  Urine analysis:    Component Value Date/Time   COLORURINE YELLOW 08/07/2023 0648   APPEARANCEUR CLEAR 08/07/2023 0648   LABSPEC 1.011 08/07/2023 0648   PHURINE 5.0 08/07/2023 0648   GLUCOSEU NEGATIVE 08/07/2023 0648   HGBUR MODERATE (A) 08/07/2023 0648   HGBUR trace-lysed 02/26/2010 0951   BILIRUBINUR NEGATIVE 08/07/2023 0648   BILIRUBINUR negative 04/29/2021 1243   KETONESUR NEGATIVE 08/07/2023 0648   PROTEINUR NEGATIVE 08/07/2023 0648   UROBILINOGEN 0.2 04/29/2021 1243   UROBILINOGEN 0.2 02/26/2010 0951   NITRITE NEGATIVE 08/07/2023 0648   LEUKOCYTESUR SMALL (A) 08/07/2023 0648    Sepsis Labs: Lactic Acid, Venous    Component Value Date/Time   LATICACIDVEN 1.6 05/11/2023 0756    MICROBIOLOGY: No results found for this or any previous visit (from the past 240 hours).  RADIOLOGY STUDIES/RESULTS: No results found.   LOS: 6 days   Kimberly Penna, MD  [facility]    To contact the attending provider between 7A-7P or the covering provider during after hours 7P-7A, please log into the web site [email or web address] and access using universal [place] password for that web site. If you do not have the password, please call the hospital operator.  08/13/2023, 11:45 AM

## 2023-08-14 DIAGNOSIS — J441 Chronic obstructive pulmonary disease with (acute) exacerbation: Secondary | ICD-10-CM | POA: Diagnosis not present

## 2023-08-14 DIAGNOSIS — R7989 Other specified abnormal findings of blood chemistry: Secondary | ICD-10-CM | POA: Diagnosis not present

## 2023-08-14 DIAGNOSIS — I10 Essential (primary) hypertension: Secondary | ICD-10-CM | POA: Diagnosis not present

## 2023-08-14 DIAGNOSIS — N179 Acute kidney failure, unspecified: Secondary | ICD-10-CM | POA: Diagnosis not present

## 2023-08-14 LAB — CBC
HCT: 38.3 % (ref 36.0–46.0)
Hemoglobin: 12.3 g/dL (ref 12.0–15.0)
MCH: 31 pg (ref 26.0–34.0)
MCHC: 32.1 g/dL (ref 30.0–36.0)
MCV: 96.5 fL (ref 80.0–100.0)
Platelets: 323 10*3/uL (ref 150–400)
RBC: 3.97 MIL/uL (ref 3.87–5.11)
RDW: 14.3 % (ref 11.5–15.5)
WBC: 23.3 10*3/uL — ABNORMAL HIGH (ref 4.0–10.5)
nRBC: 0.1 % (ref 0.0–0.2)

## 2023-08-14 MED ORDER — PREDNISONE 10 MG PO TABS: ORAL_TABLET | ORAL | 3 refills | Status: DC

## 2023-08-14 MED ORDER — FUROSEMIDE 40 MG PO TABS: 40.0000 mg | ORAL_TABLET | Freq: Every day | ORAL | 3 refills | Status: AC

## 2023-08-14 NOTE — Plan of Care (Signed)
  Problem: Education: Goal: Knowledge of General Education information will improve Description: Including pain rating scale, medication(s)/side effects and non-pharmacologic comfort measures Outcome: Completed/Met   Problem: Health Behavior/Discharge Planning: Goal: Ability to manage health-related needs will improve Outcome: Completed/Met   Problem: Clinical Measurements: Goal: Ability to maintain clinical measurements within normal limits will improve Outcome: Completed/Met Goal: Will remain free from infection Outcome: Completed/Met Goal: Diagnostic test results will improve Outcome: Completed/Met Goal: Respiratory complications will improve Outcome: Completed/Met Goal: Cardiovascular complication will be avoided Outcome: Completed/Met   Problem: Activity: Goal: Risk for activity intolerance will decrease Outcome: Completed/Met   Problem: Nutrition: Goal: Adequate nutrition will be maintained Outcome: Completed/Met   Problem: Coping: Goal: Level of anxiety will decrease Outcome: Completed/Met   Problem: Elimination: Goal: Will not experience complications related to bowel motility Outcome: Completed/Met Goal: Will not experience complications related to urinary retention Outcome: Completed/Met   Problem: Pain Managment: Goal: General experience of comfort will improve and/or be controlled Outcome: Completed/Met   Problem: Safety: Goal: Ability to remain free from injury will improve Outcome: Completed/Met   Problem: Skin Integrity: Goal: Risk for impaired skin integrity will decrease Outcome: Completed/Met   Problem: Education: Goal: Ability to demonstrate management of disease process will improve Outcome: Completed/Met Goal: Ability to verbalize understanding of medication therapies will improve Outcome: Completed/Met Goal: Individualized Educational Video(s) Outcome: Completed/Met   Problem: Activity: Goal: Capacity to carry out activities will  improve Outcome: Completed/Met   Problem: Cardiac: Goal: Ability to achieve and maintain adequate cardiopulmonary perfusion will improve Outcome: Completed/Met   Problem: Education: Goal: Knowledge of disease or condition will improve Outcome: Completed/Met Goal: Knowledge of the prescribed therapeutic regimen will improve Outcome: Completed/Met Goal: Individualized Educational Video(s) Outcome: Completed/Met   Problem: Activity: Goal: Ability to tolerate increased activity will improve Outcome: Completed/Met Goal: Will verbalize the importance of balancing activity with adequate rest periods Outcome: Completed/Met   Problem: Respiratory: Goal: Ability to maintain a clear airway will improve Outcome: Completed/Met Goal: Levels of oxygenation will improve Outcome: Completed/Met Goal: Ability to maintain adequate ventilation will improve Outcome: Completed/Met

## 2023-08-14 NOTE — Plan of Care (Signed)

## 2023-08-14 NOTE — TOC Transition Note (Signed)
 Transition of Care [facility]) - Discharge Note   Patient Details  Name: Kim [name] MRN: [id number] Date of Birth: 06/18/1955  [facility]) CM/SW Contact:  Eusebio High, RN Phone Number: 08/14/2023, 9:13 AM   Clinical Narrative:     Patient will DC to home today with Husband. [facility] been notified and they will resume home hospice care. Husband has portable O2 for transport home-  No additional TOC needs           Patient Goals and CMS Choice            Discharge Placement                       Discharge Plan and Services Additional resources added to the After Visit Summary for                                       Social Drivers of Health (SDOH) Interventions SDOH Screenings   Food Insecurity: No Food Insecurity (08/10/2023)  Housing: Low Risk  (08/10/2023)  Transportation Needs: No Transportation Needs (08/10/2023)  Utilities: Not At Risk (08/10/2023)  Alcohol  Screen: Low Risk  (03/22/2020)  Depression (PHQ2-9): Medium Risk (09/10/2021)  Financial Resource Strain: High Risk (09/10/2021)  Physical Activity: Inactive (03/25/2021)  Social Connections: Socially Isolated (08/10/2023)  Stress: No Stress Concern Present (09/10/2021)  Tobacco Use: Medium Risk (08/07/2023)     Readmission Risk Interventions    05/17/2023   12:49 PM 09/20/2021    3:27 PM  Readmission Risk Prevention Plan  Transportation Screening Complete Complete  PCP or Specialist Appt within 3-5 Days Complete   HRI or Home Care Consult Complete   Social Work Consult for Recovery Care Planning/Counseling Complete   Palliative Care Screening Not Applicable   Medication Review [employer]) Complete Complete  PCP or Specialist appointment within 3-5 days of discharge  Complete  HRI or Home Care Consult  Complete  SW Recovery Care/Counseling Consult  Complete  Palliative Care Screening  Not Applicable  Skilled Nursing Facility  Not Applicable

## 2023-08-14 NOTE — Discharge Summary (Signed)
 PATIENT DETAILS Name: Kim [name] Age: 68 y.o. Sex: female Date of Birth: 05-17-1955 MRN: [id number]. Admitting Physician: Walton Guppy, MD BJY:[id number], Randel Buss, NP  Admit Date: 08/06/2023 Discharge date: 08/14/2023  Recommendations for Outpatient Follow-up:  Follow up with PCP in 1-2 weeks Please obtain CMP/CBC in one week  Admitted From:  Home Hospice  Disposition: Home Hospice   Discharge Condition: fair  CODE STATUS:   Code Status: Limited: Do not attempt resuscitation (DNR) -DNR-LIMITED -Do Not Intubate/DNI    Diet recommendation:  Diet Order             Diet - low sodium heart healthy           Diet regular Room service appropriate? Yes; Fluid consistency: Thin  Diet effective now                    Brief Summary: Patient is a 68 y.o.  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   Significant events: 5/1>> admit to [facility]   Significant studies: 5/2>> CT chest: No acute findings. 5/2>> echo: EF mildly reduced-probably 50-55% by estimation-poor study.   Significant microbiology data: None   Procedures: None   Consults: Cardiology Palliative care  Brief Hospital Course: Acute on chronic hypoxic respiratory failure secondary to COPD exacerbation Clinically improved with steroids/bronchodilators and empiric antibiotics Anxiety remains a significant component Lungs are clear this morning Will switch Solu-Medrol  to prednisone -and reduce by 10 mg every week until she is back to her usual dosing of prednisone  10 mg daily Continue bronchodilators Stable for discharge back to home with hospice care.   Chronic combined HFpEF/HFrEF Euvolemic Lasix  resumed at half of home dose-due to concerns of orthostatic hypotension No longer on beta-blocker   Elevated troponin Demand ischemia Echo with low normal EF No further  workup recommended by cardiology.   AKI on CKD stage IIIb AKI hemodynamically mediated Creatinine back to baseline   Anxiety Significant component and dyspnea Continue scheduled Paxil  Continue as needed Xanax .   Palliative care DNR in place End-stage COPD-on home hospice.  Discharge Diagnoses:  Principal Problem:   COPD with acute exacerbation (HCC) Active Problems:   Elevated troponin   Essential hypertension   Acute renal failure superimposed on stage 3b chronic kidney disease (HCC)   Acute on chronic combined systolic and diastolic CHF (congestive heart failure) (HCC)   Acute on chronic respiratory failure with hypoxia [facility])   Discharge Instructions:  Activity:  As tolerated with Full fall precautions use walker/cane & assistance as needed  Discharge Instructions     Call MD for:  difficulty breathing, headache or visual disturbances   Complete by: As directed    Diet - low sodium heart healthy   Complete by: As directed    Discharge instructions   Complete by: As directed    Follow with Primary MD  Alto Atta, NP in 1-2 weeks  Please get a complete blood count and chemistry panel checked by your Primary MD at your next visit, and again as instructed by your Primary MD.  Get Medicines reviewed and adjusted: Please take all your medications with you for your next visit with your Primary MD  Laboratory/radiological data: Please request your Primary MD to go over all hospital tests and procedure/radiological results at the follow up, please ask your Primary MD to get all Hospital records sent to his/her office.  In  some cases, they will be blood work, cultures and biopsy results pending at the time of your discharge. Please request that your primary care M.D. follows up on these results.  Also Note the following: If you experience worsening of your admission symptoms, develop shortness of breath, life threatening emergency, suicidal or homicidal thoughts you  must seek medical attention immediately by calling 911 or calling your MD immediately  if symptoms less severe.  You must read complete instructions/literature along with all the possible adverse reactions/side effects for all the Medicines you take and that have been prescribed to you. Take any new Medicines after you have completely understood and accpet all the possible adverse reactions/side effects.   Do not drive when taking Pain medications or sleeping medications (Benzodaizepines)  Do not take more than prescribed Pain, Sleep and Anxiety Medications. It is not advisable to combine anxiety,sleep and pain medications without talking with your primary care practitioner  Special Instructions: If you have smoked or chewed Tobacco  in the last 2 yrs please stop smoking, stop any regular Alcohol   and or any Recreational drug use.  Wear Seat belts while driving.  Please note: You were cared for by a hospitalist during your hospital stay. Once you are discharged, your primary care physician will handle any further medical issues. Please note that NO REFILLS for any discharge medications will be authorized once you are discharged, as it is imperative that you return to your primary care physician (or establish a relationship with a primary care physician if you do not have one) for your post hospital discharge needs so that they can reassess your need for medications and monitor your lab values.   Increase activity slowly   Complete by: As directed       Allergies as of 08/14/2023       Reactions   Tape Other (See Comments)   Skin tears easily, peels skin off   Daliresp  [roflumilast ] Other (See Comments)   Skin peeling   Cozaar  [losartan ] Other (See Comments)   Possible cause of severe hypotension   Dupixent  [dupilumab ] Diarrhea, Swelling, Other (See Comments), Cough   Facial swelling Headache   Latex Other (See Comments)   Peels skin off   Penicillins Hives   Statins Other (See  Comments)   Myalgia   Torsemide  Other (See Comments)   Severe stomach cramps and all over   Trazodone  And Nefazodone    Patient stated the last 2 times she took this medication gave her very bad migraine so she cut them out completely.   Zestril  [lisinopril ] Cough   Omnicef  [cefdinir ] Rash        Medication List     STOP taking these medications    bisoprolol  5 MG tablet Commonly known as: ZEBETA    HYDROcodone -acetaminophen  10-325 MG tablet Commonly known as: NORCO   HYDROcodone -acetaminophen  7.5-325 MG tablet Commonly known as: Norco   LORazepam  1 MG tablet Commonly known as: ATIVAN    magnesium  oxide 400 MG tablet Commonly known as: MAG-OX   spironolactone  25 MG tablet Commonly known as: ALDACTONE    sulfamethoxazole-trimethoprim 400-80 MG tablet Commonly known as: BACTRIM       TAKE these medications    albuterol  108 (90 Base) MCG/ACT inhaler Commonly known as: Ventolin  HFA INHALE 2 PUFFS INTO THE LUNGS EVERY 4 TO 6 HOURS AS NEEDED FOR SHORTNESS OF BREATH OR WHEEZING What changed:  how much to take how to take this when to take this reasons to take this additional instructions  ALPRAZolam  1 MG tablet Commonly known as: XANAX  TAKE 1 TABLET(1 MG) BY MOUTH THREE TIMES DAILY What changed: See the new instructions.   aspirin  EC 81 MG tablet Take 1 tablet (81 mg total) by mouth daily. What changed: when to take this   butalbital -acetaminophen -caffeine  50-325-40 MG tablet Commonly known as: FIORICET  Take 1 tablet by mouth every 4 (four) hours as needed for headache. TAKE 1 TABLET BY MOUTH EVERY 4 HOURS AS NEEDED FOR HEADACHETAKE 1 TABLET BY MOUTH EVERY 4 HOURS AS NEEDED FOR HEADACHE What changed: additional instructions   cetirizine 10 MG tablet Commonly known as: ZYRTEC Take 10 mg by mouth in the morning.   ezetimibe  10 MG tablet Commonly known as: ZETIA  TAKE 1 TABLET(10 MG) BY MOUTH DAILY What changed:  how much to take how to take this when  to take this additional instructions   furosemide  40 MG tablet Commonly known as: LASIX  Take 1 tablet (40 mg total) by mouth at bedtime. What changed:  how much to take when to take this   hydrocortisone  cream 1 % Apply 1 Application topically 2 (two) times daily.   ipratropium-albuterol  0.5-2.5 (3) MG/3ML Soln Commonly known as: DUONEB Take 3 mLs by nebulization every 4 (four) hours as needed. What changed: when to take this   morphine  CONCENTRATE 10 mg / 0.5 ml concentrated solution Take 0.5 mLs by mouth as needed for severe pain (pain score 7-10).   Mucinex  Maximum Strength 1200 MG Tb12 Generic drug: Guaifenesin  Take 1,200 mg by mouth every 12 (twelve) hours.   nitroGLYCERIN  0.4 MG SL tablet Commonly known as: NITROSTAT  Place 1 tablet (0.4 mg total) under the tongue every 5 (five) minutes as needed for chest pain.   nystatin  100000 UNIT/ML suspension Commonly known as: MYCOSTATIN  Take 5 mLs (500,000 Units total) by mouth 4 (four) times daily. What changed:  when to take this reasons to take this   OXYGEN Inhale 2.5-3.5 L/min into the lungs continuous.   PARoxetine  40 MG tablet Commonly known as: PAXIL  Take 0.5 tablets (20 mg total) by mouth in the morning. TAKE 1 TABLET(40 MG) BY MOUTH EVERY MORNING What changed:  how much to take additional instructions   predniSONE  10 MG tablet Commonly known as: DELTASONE  4 tabs daily for 7 days then 3 tabs daily for 7 days then 2 tabs daily for 7 days then 1 tab daily What changed: additional instructions   rosuvastatin  20 MG tablet Commonly known as: CRESTOR  Take 1 tablet (20 mg total) by mouth daily. What changed: when to take this   Senokot 8.6 MG tablet Generic drug: senna Take 1 tablet by mouth 2 (two) times daily as needed for constipation.   Vitamin B-12 5000 MCG Subl Place 5,000 mcg under the tongue at bedtime.   Vitamin D3 125 MCG (5000 UT) Caps Take 5,000 Units by mouth at bedtime.        Follow-up  Information     Alto Atta, NP. Schedule an appointment as soon as possible for a visit in 1 week(s).   Specialty: Family Medicine Contact information: [address] [place] [address] [phone]                Allergies  Allergen Reactions   Tape Other (See Comments)    Skin tears easily, peels skin off   Daliresp  [Roflumilast ] Other (See Comments)    Skin peeling   Cozaar  [Losartan ] Other (See Comments)    Possible cause of severe hypotension   Dupixent  [  Dupilumab ] Diarrhea, Swelling, Other (See Comments) and Cough    Facial swelling Headache   Latex Other (See Comments)    Peels skin off   Penicillins Hives   Statins Other (See Comments)    Myalgia    Torsemide  Other (See Comments)    Severe stomach cramps and all over   Trazodone  And Nefazodone     Patient stated the last 2 times she took this medication gave her very bad migraine so she cut them out completely.   Zestril  [Lisinopril ] Cough   Omnicef  [Cefdinir ] Rash     Other Procedures/Studies: ECHOCARDIOGRAM COMPLETE Result Date: 08/07/2023    ECHOCARDIOGRAM REPORT   Patient Name:   MADLENE HULSLANDER Date of Exam: 08/07/2023 Medical Rec #:  [id number]       Height:       63.0 in Accession #:    [id number]      Weight:       151.0 lb Date of Birth:  [birth year]      BSA:          1.716 m Patient Age:    67 years        BP:           137/83 mmHg Patient Gender: F               HR:           142 bpm. Exam Location:  Inpatient Procedure: 2D Echo, Cardiac Doppler and Color Doppler (Both Spectral and Color            Flow Doppler were utilized during procedure). Indications:    I50.21 Acute systolic (congestive) heart failure  History:        Patient has prior history of Echocardiogram examinations, most                 recent 09/11/2022. CHF, COPD; Risk Factors:Hypertension.  Sonographer:    Andrena Bang Referring Phys: [id number] TIMOTHY S OPYD  Sonographer Comments: Pt had panic attack during exam. IMPRESSIONS  1.  Extremely poor windows even with contrast, exam also limited by tachycardia. EF appears at most mildly reduced. There is a septal wall abnormality, but may be due to bundle branch block. Would recommend repeating when tachycardia improved. Left ventricular ejection fraction, by estimation, is 50 to 55%. The left ventricle has low normal function. Left ventricular endocardial border not optimally defined to evaluate regional wall motion. Left ventricular diastolic function could not be evaluated.  2. Right ventricular systolic function is normal. The right ventricular size is normal.  3. The mitral valve is grossly normal. No evidence of mitral valve regurgitation. No evidence of mitral stenosis.  4. The aortic valve is grossly normal. Aortic valve regurgitation is not visualized. No aortic stenosis is present.  5. The inferior vena cava is normal in size with greater than 50% respiratory variability, suggesting right atrial pressure of 3 mmHg. FINDINGS  Left Ventricle: Extremely poor windows even with contrast, exam also limited by tachycardia. EF appears at most mildly reduced. There is a septal wall abnormality, but may be due to bundle branch block. Would recommend repeating when tachycardia improved. Left ventricular ejection fraction, by estimation, is 50 to 55%. The left ventricle has low normal function. Left ventricular endocardial border not optimally defined to evaluate regional wall motion. Definity  contrast agent was given IV to delineate the left ventricular endocardial borders. The left ventricular internal cavity size was normal in size. There is no left ventricular hypertrophy.  Left ventricular diastolic function could not be evaluated. Right Ventricle: The right ventricular size is normal. No increase in right ventricular wall thickness. Right ventricular systolic function is normal. Left Atrium: Left atrial size was normal in size. Right Atrium: Right atrial size was normal in size. Pericardium:  There is no evidence of pericardial effusion. Mitral Valve: The mitral valve is grossly normal. No evidence of mitral valve regurgitation. No evidence of mitral valve stenosis. Tricuspid Valve: The tricuspid valve is normal in structure. Tricuspid valve regurgitation is not demonstrated. No evidence of tricuspid stenosis. Aortic Valve: The aortic valve is grossly normal. Aortic valve regurgitation is not visualized. No aortic stenosis is present. Aortic valve mean gradient measures 3.0 mmHg. Aortic valve peak gradient measures 7.2 mmHg. Pulmonic Valve: The pulmonic valve was not well visualized. Pulmonic valve regurgitation is not visualized. No evidence of pulmonic stenosis. Aorta: The aortic root is normal in size and structure. Venous: The inferior vena cava is normal in size with greater than 50% respiratory variability, suggesting right atrial pressure of 3 mmHg. IAS/Shunts: No atrial level shunt detected by color flow Doppler.   Diastology LV e' lateral: 13.70 cm/s  RIGHT VENTRICLE RV S prime:     16.50 cm/s TAPSE (M-mode): 1.0 cm LEFT ATRIUM             Index LA Vol (A2C):   27.1 ml 15.79 ml/m LA Vol (A4C):   15.5 ml 9.03 ml/m LA Biplane Vol: 20.2 ml 11.77 ml/m  AORTIC VALVE AV Vmax:           134.00 cm/s AV Vmean:          80.200 cm/s AV VTI:            0.140 m AV Peak Grad:      7.2 mmHg AV Mean Grad:      3.0 mmHg LVOT Vmax:         99.00 cm/s LVOT Vmean:        54.800 cm/s LVOT VTI:          0.142 m LVOT/AV VTI ratio: 1.01  AORTA Ao Asc diam: 3.00 cm  SHUNTS Systemic VTI: 0.14 m Arta Lark Electronically signed by Arta Lark Signature Date/Time: 08/07/2023/9:00:21 AM    Final    CT Chest Wo Contrast Result Date: 08/07/2023 EXAM: CT CHEST WITHOUT CONTRAST 08/07/2023 03:43:59 AM TECHNIQUE: CT of the chest was performed without the administration of intravenous contrast. Multiplanar reformatted images are provided for review. Automated exposure control, iterative reconstruction, and/or weight  based adjustment of the mA/kV was utilized to reduce the radiation dose to as low as reasonably achievable. COMPARISON: Chest radiograph earlier today and CT chest dated 05/08/2022. CLINICAL HISTORY: Respiratory illness, nondiagnostic xray. Triage notes; PT arrived via EMS. Complaints of SOB the past 3 days, PT was on O2 at home sat was 92% on 4L. PT is distress and obvious wheezing. PT is alert and talking and states she is hurting all over. PT HR was 130 and O2 sat was 99% on 8L. PT was on cpap but they removed prior to arrival. PT is a DNR per paperwork from EMS. FINDINGS: MEDIASTINUM: Heart and pericardium are unremarkable. The central airways are clear. Mild 3-vessel coronary atherosclerosis. Thoracic aortic atherosclerosis. LYMPH NODES: No evidence of mediastinal, hilar or axillary lymphadenopathy. LUNGS AND PLEURA: Moderate centrilobular emphysematous changes, upper lung predominant. Mild scarring/atelectasis in the posterior left upper lobe. No focal consolidation or pulmonary edema. No evidence of pleural effusion or pneumothorax. SOFT  TISSUES/BONES: Mild anterior wedging at T6-7, chronic. No acute abnormality of the bones or soft tissues. UPPER ABDOMEN: Status post cholecystectomy. Limited images of the upper abdomen demonstrates no acute abnormality. IMPRESSION: 1. No acute findings. Electronically signed by: Zadie Herter MD 08/07/2023 03:51 AM EDT RP Workstation: [id number]   DG Chest Portable 1 View Result Date: 08/07/2023 CLINICAL DATA:  evaluate for sob EXAM: PORTABLE CHEST 1 VIEW COMPARISON:  Chest x-ray 05/12/2023 FINDINGS: The heart and mediastinal contours are unchanged. No focal consolidation. No pulmonary edema. No pleural effusion. No pneumothorax. No acute osseous abnormality. IMPRESSION: No active disease. Electronically Signed   By: Morgane  Naveau M.D.   On: 08/07/2023 00:05     TODAY-DAY OF DISCHARGE:  Subjective:   Kim [name] today has no headache,no chest abdominal  pain,no new weakness tingling or numbness, feels much better wants to go home today.   Objective:   Blood pressure 135/68, pulse 89, temperature (!) 97.5 F (36.4 C), temperature source Oral, resp. rate 13, height 5\' 3"  (1.6 m), weight 75.7 kg, SpO2 97%.  Intake/Output Summary (Last 24 hours) at 08/14/2023 0829 Last data filed at 08/14/2023 0400 Gross per 24 hour  Intake 480 ml  Output 2250 ml  Net -1770 ml   Filed Weights   08/12/23 0500 08/13/23 0500 08/14/23 0437  Weight: 76 kg 76.8 kg 75.7 kg    Exam: Awake Alert, Oriented *3, No new F.N deficits, Normal affect [place].AT,PERRAL Supple Neck,No JVD, No cervical lymphadenopathy appriciated.  Symmetrical Chest wall movement, Good air movement bilaterally, CTAB RRR,No Gallops,Rubs or new Murmurs, No Parasternal Heave +ve B.Sounds, Abd Soft, Non tender, No organomegaly appriciated, No rebound -guarding or rigidity. No Cyanosis, Clubbing or edema, No new Rash or bruise   PERTINENT RADIOLOGIC STUDIES: No results found.   PERTINENT LAB RESULTS: CBC: Recent Labs    08/13/23 0518 08/14/23 0629  WBC 20.9* 23.3*  HGB 11.8* 12.3  HCT 37.2 38.3  PLT 310 323   CMET CMP     Component Value Date/Time   NA 138 08/13/2023 0518   NA 144 04/23/2023 1547   K 5.0 08/13/2023 0518   CL 97 (L) 08/13/2023 0518   CO2 32 08/13/2023 0518   GLUCOSE 131 (H) 08/13/2023 0518   BUN 28 (H) 08/13/2023 0518   BUN 24 04/23/2023 1547   CREATININE 1.22 (H) 08/13/2023 0518   CREATININE 1.16 (H) 07/19/2015 1447   CALCIUM  9.4 08/13/2023 0518   PROT 6.7 08/06/2023 2342   PROT 6.3 08/25/2022 1124   ALBUMIN 3.7 08/06/2023 2342   ALBUMIN 4.4 08/25/2022 1124   AST 33 08/06/2023 2342   ALT 20 08/06/2023 2342   ALKPHOS 50 08/06/2023 2342   BILITOT 0.4 08/06/2023 2342   BILITOT 0.3 08/25/2022 1124   GFR 41.25 (L) 05/29/2023 1450   EGFR 46 (L) 04/23/2023 1547   GFRNONAA 48 (L) 08/13/2023 0518    GFR Estimated Creatinine Clearance: 42.4 mL/min (A) (by  C-G formula based on SCr of 1.22 mg/dL (H)). No results for input(s): "LIPASE", "AMYLASE" in the last 72 hours. No results for input(s): "CKTOTAL", "CKMB", "CKMBINDEX", "TROPONINI" in the last 72 hours. Invalid input(s): "POCBNP" No results for input(s): "DDIMER" in the last 72 hours. No results for input(s): "HGBA1C" in the last 72 hours. No results for input(s): "CHOL", "HDL", "LDLCALC", "TRIG", "CHOLHDL", "LDLDIRECT" in the last 72 hours. No results for input(s): "TSH", "T4TOTAL", "T3FREE", "THYROIDAB" in the last 72 hours.  Invalid input(s): "FREET3" No results for input(s): "VITAMINB12", "FOLATE", "FERRITIN", "  TIBC", "IRON", "RETICCTPCT" in the last 72 hours. Coags: No results for input(s): "INR" in the last 72 hours.  Invalid input(s): "PT" Microbiology: No results found for this or any previous visit (from the past 240 hours).  FURTHER DISCHARGE INSTRUCTIONS:  Get Medicines reviewed and adjusted: Please take all your medications with you for your next visit with your Primary MD  Laboratory/radiological data: Please request your Primary MD to go over all hospital tests and procedure/radiological results at the follow up, please ask your Primary MD to get all Hospital records sent to his/her office.  In some cases, they will be blood work, cultures and biopsy results pending at the time of your discharge. Please request that your primary care M.D. goes through all the records of your hospital data and follows up on these results.  Also Note the following: If you experience worsening of your admission symptoms, develop shortness of breath, life threatening emergency, suicidal or homicidal thoughts you must seek medical attention immediately by calling 911 or calling your MD immediately  if symptoms less severe.  You must read complete instructions/literature along with all the possible adverse reactions/side effects for all the Medicines you take and that have been prescribed to  you. Take any new Medicines after you have completely understood and accpet all the possible adverse reactions/side effects.   Do not drive when taking Pain medications or sleeping medications (Benzodaizepines)  Do not take more than prescribed Pain, Sleep and Anxiety Medications. It is not advisable to combine anxiety,sleep and pain medications without talking with your primary care practitioner  Special Instructions: If you have smoked or chewed Tobacco  in the last 2 yrs please stop smoking, stop any regular Alcohol   and or any Recreational drug use.  Wear Seat belts while driving.  Please note: You were cared for by a hospitalist during your hospital stay. Once you are discharged, your primary care physician will handle any further medical issues. Please note that NO REFILLS for any discharge medications will be authorized once you are discharged, as it is imperative that you return to your primary care physician (or establish a relationship with a primary care physician if you do not have one) for your post hospital discharge needs so that they can reassess your need for medications and monitor your lab values.  Total Time spent coordinating discharge including counseling, education and face to face time equals greater than 30 minutes.  Signed: Kimberly Penna 08/14/2023 8:29 AM

## 2023-09-21 ENCOUNTER — Ambulatory Visit (HOSPITAL_BASED_OUTPATIENT_CLINIC_OR_DEPARTMENT_OTHER): Admitting: Pulmonary Disease

## 2023-11-16 ENCOUNTER — Other Ambulatory Visit: Payer: Self-pay

## 2023-11-16 ENCOUNTER — Inpatient Hospital Stay (HOSPITAL_COMMUNITY)
Admission: EM | Admit: 2023-11-16 | Discharge: 2023-11-20 | DRG: 189 | Disposition: A | Discharge status: Hospice | Attending: Internal Medicine | Admitting: Internal Medicine

## 2023-11-16 ENCOUNTER — Encounter (HOSPITAL_COMMUNITY): Payer: Self-pay

## 2023-11-16 ENCOUNTER — Emergency Department (HOSPITAL_COMMUNITY)

## 2023-11-16 DIAGNOSIS — Z823 Family history of stroke: Secondary | ICD-10-CM

## 2023-11-16 DIAGNOSIS — E785 Hyperlipidemia, unspecified: Secondary | ICD-10-CM | POA: Diagnosis present

## 2023-11-16 DIAGNOSIS — I252 Old myocardial infarction: Secondary | ICD-10-CM

## 2023-11-16 DIAGNOSIS — Z8349 Family history of other endocrine, nutritional and metabolic diseases: Secondary | ICD-10-CM

## 2023-11-16 DIAGNOSIS — G4730 Sleep apnea, unspecified: Secondary | ICD-10-CM | POA: Diagnosis present

## 2023-11-16 DIAGNOSIS — I10 Essential (primary) hypertension: Secondary | ICD-10-CM | POA: Diagnosis not present

## 2023-11-16 DIAGNOSIS — F419 Anxiety disorder, unspecified: Secondary | ICD-10-CM | POA: Diagnosis present

## 2023-11-16 DIAGNOSIS — Z8261 Family history of arthritis: Secondary | ICD-10-CM | POA: Diagnosis not present

## 2023-11-16 DIAGNOSIS — Z8262 Family history of osteoporosis: Secondary | ICD-10-CM

## 2023-11-16 DIAGNOSIS — Z87891 Personal history of nicotine dependence: Secondary | ICD-10-CM | POA: Diagnosis not present

## 2023-11-16 DIAGNOSIS — N1832 Chronic kidney disease, stage 3b: Secondary | ICD-10-CM | POA: Diagnosis present

## 2023-11-16 DIAGNOSIS — Z7982 Long term (current) use of aspirin: Secondary | ICD-10-CM | POA: Diagnosis not present

## 2023-11-16 DIAGNOSIS — I130 Hypertensive heart and chronic kidney disease with heart failure and stage 1 through stage 4 chronic kidney disease, or unspecified chronic kidney disease: Secondary | ICD-10-CM | POA: Diagnosis present

## 2023-11-16 DIAGNOSIS — G4733 Obstructive sleep apnea (adult) (pediatric): Secondary | ICD-10-CM | POA: Diagnosis present

## 2023-11-16 DIAGNOSIS — J9621 Acute and chronic respiratory failure with hypoxia: Principal | ICD-10-CM | POA: Diagnosis present

## 2023-11-16 DIAGNOSIS — N1831 Chronic kidney disease, stage 3a: Secondary | ICD-10-CM | POA: Diagnosis not present

## 2023-11-16 DIAGNOSIS — J441 Chronic obstructive pulmonary disease with (acute) exacerbation: Secondary | ICD-10-CM | POA: Diagnosis present

## 2023-11-16 DIAGNOSIS — I5032 Chronic diastolic (congestive) heart failure: Secondary | ICD-10-CM | POA: Diagnosis not present

## 2023-11-16 DIAGNOSIS — Z820 Family history of epilepsy and other diseases of the nervous system: Secondary | ICD-10-CM | POA: Diagnosis not present

## 2023-11-16 DIAGNOSIS — Z883 Allergy status to other anti-infective agents status: Secondary | ICD-10-CM

## 2023-11-16 DIAGNOSIS — F32A Depression, unspecified: Secondary | ICD-10-CM | POA: Diagnosis present

## 2023-11-16 DIAGNOSIS — Z66 Do not resuscitate: Secondary | ICD-10-CM | POA: Diagnosis present

## 2023-11-16 DIAGNOSIS — E876 Hypokalemia: Secondary | ICD-10-CM | POA: Diagnosis present

## 2023-11-16 DIAGNOSIS — Z91040 Latex allergy status: Secondary | ICD-10-CM

## 2023-11-16 DIAGNOSIS — D72829 Elevated white blood cell count, unspecified: Secondary | ICD-10-CM | POA: Diagnosis present

## 2023-11-16 DIAGNOSIS — I340 Nonrheumatic mitral (valve) insufficiency: Secondary | ICD-10-CM | POA: Diagnosis present

## 2023-11-16 DIAGNOSIS — Z880 Allergy status to penicillin: Secondary | ICD-10-CM

## 2023-11-16 DIAGNOSIS — Z9981 Dependence on supplemental oxygen: Secondary | ICD-10-CM

## 2023-11-16 DIAGNOSIS — Z8249 Family history of ischemic heart disease and other diseases of the circulatory system: Secondary | ICD-10-CM | POA: Diagnosis not present

## 2023-11-16 DIAGNOSIS — Z833 Family history of diabetes mellitus: Secondary | ICD-10-CM | POA: Diagnosis not present

## 2023-11-16 DIAGNOSIS — N183 Chronic kidney disease, stage 3 unspecified: Secondary | ICD-10-CM | POA: Diagnosis present

## 2023-11-16 DIAGNOSIS — I5042 Chronic combined systolic (congestive) and diastolic (congestive) heart failure: Secondary | ICD-10-CM | POA: Diagnosis present

## 2023-11-16 DIAGNOSIS — Z91048 Other nonmedicinal substance allergy status: Secondary | ICD-10-CM

## 2023-11-16 DIAGNOSIS — Z90710 Acquired absence of both cervix and uterus: Secondary | ICD-10-CM

## 2023-11-16 DIAGNOSIS — Z1152 Encounter for screening for COVID-19: Secondary | ICD-10-CM

## 2023-11-16 DIAGNOSIS — Z9049 Acquired absence of other specified parts of digestive tract: Secondary | ICD-10-CM

## 2023-11-16 DIAGNOSIS — Z888 Allergy status to other drugs, medicaments and biological substances status: Secondary | ICD-10-CM

## 2023-11-16 DIAGNOSIS — Z7952 Long term (current) use of systemic steroids: Secondary | ICD-10-CM

## 2023-11-16 DIAGNOSIS — Z808 Family history of malignant neoplasm of other organs or systems: Secondary | ICD-10-CM

## 2023-11-16 DIAGNOSIS — Z87442 Personal history of urinary calculi: Secondary | ICD-10-CM

## 2023-11-16 DIAGNOSIS — Z79899 Other long term (current) drug therapy: Secondary | ICD-10-CM

## 2023-11-16 DIAGNOSIS — I5030 Unspecified diastolic (congestive) heart failure: Secondary | ICD-10-CM | POA: Diagnosis present

## 2023-11-16 LAB — I-STAT ARTERIAL BLOOD GAS, ED
Acid-Base Excess: 2 mmol/L (ref 0.0–2.0)
Bicarbonate: 25.1 mmol/L (ref 20.0–28.0)
Calcium, Ion: 1.21 mmol/L (ref 1.15–1.40)
HCT: 36 % (ref 36.0–46.0)
Hemoglobin: 12.2 g/dL (ref 12.0–15.0)
O2 Saturation: 100 %
Patient temperature: 97.5
Potassium: 3.1 mmol/L — ABNORMAL LOW (ref 3.5–5.1)
Sodium: 139 mmol/L (ref 135–145)
TCO2: 26 mmol/L (ref 22–32)
pCO2 arterial: 31 mmHg — ABNORMAL LOW (ref 32–48)
pH, Arterial: 7.513 — ABNORMAL HIGH (ref 7.35–7.45)
pO2, Arterial: 170 mmHg — ABNORMAL HIGH (ref 83–108)

## 2023-11-16 LAB — COMPREHENSIVE METABOLIC PANEL WITH GFR
ALT: 24 U/L (ref 0–44)
AST: 30 U/L (ref 15–41)
Albumin: 3.5 g/dL (ref 3.5–5.0)
Alkaline Phosphatase: 53 U/L (ref 38–126)
Anion gap: 13 (ref 5–15)
BUN: 14 mg/dL (ref 8–23)
CO2: 28 mmol/L (ref 22–32)
Calcium: 9.3 mg/dL (ref 8.9–10.3)
Chloride: 100 mmol/L (ref 98–111)
Creatinine, Ser: 1.31 mg/dL — ABNORMAL HIGH (ref 0.44–1.00)
GFR, Estimated: 44 mL/min — ABNORMAL LOW (ref >60)
Glucose, Bld: 282 mg/dL — ABNORMAL HIGH (ref 70–99)
Potassium: 3.8 mmol/L (ref 3.5–5.1)
Sodium: 141 mmol/L (ref 135–145)
Total Bilirubin: 0.3 mg/dL (ref 0.0–1.2)
Total Protein: 6.9 g/dL (ref 6.5–8.1)

## 2023-11-16 LAB — CBC
HCT: 41.4 % (ref 36.0–46.0)
Hemoglobin: 12.6 g/dL (ref 12.0–15.0)
MCH: 29.7 pg (ref 26.0–34.0)
MCHC: 30.4 g/dL (ref 30.0–36.0)
MCV: 97.6 fL (ref 80.0–100.0)
Platelets: 348 K/uL (ref 150–400)
RBC: 4.24 MIL/uL (ref 3.87–5.11)
RDW: 14.3 % (ref 11.5–15.5)
WBC: 16.7 K/uL — ABNORMAL HIGH (ref 4.0–10.5)
nRBC: 0 % (ref 0.0–0.2)

## 2023-11-16 LAB — RESP PANEL BY RT-PCR (RSV, FLU A&B, COVID)  RVPGX2
Influenza A by PCR: NEGATIVE
Influenza B by PCR: NEGATIVE
Resp Syncytial Virus by PCR: NEGATIVE
SARS Coronavirus 2 by RT PCR: NEGATIVE

## 2023-11-16 LAB — BRAIN NATRIURETIC PEPTIDE: B Natriuretic Peptide: 151.4 pg/mL — ABNORMAL HIGH (ref 0.0–100.0)

## 2023-11-16 LAB — MAGNESIUM: Magnesium: 2.3 mg/dL (ref 1.7–2.4)

## 2023-11-16 MED ORDER — SODIUM CHLORIDE 0.9% FLUSH
3.0000 mL | Freq: Two times a day (BID) | INTRAVENOUS | Status: DC
  Administered 2023-11-16 – 2023-11-19 (×12): 3 mL via INTRAVENOUS

## 2023-11-16 MED ORDER — FUROSEMIDE 10 MG/ML IJ SOLN: 20.0000 mg | Freq: Once | INTRAMUSCULAR | Status: DC

## 2023-11-16 MED ORDER — POTASSIUM CHLORIDE CRYS ER 20 MEQ PO TBCR
60.0000 meq | EXTENDED_RELEASE_TABLET | Freq: Once | ORAL | Status: AC
  Administered 2023-11-16 (×2): 60 meq via ORAL
  Filled 2023-11-16: qty 3

## 2023-11-16 MED ORDER — ALBUTEROL SULFATE (2.5 MG/3ML) 0.083% IN NEBU: 2.5000 mg | INHALATION_SOLUTION | RESPIRATORY_TRACT | Status: DC | PRN

## 2023-11-16 MED ORDER — ACETAMINOPHEN 650 MG RE SUPP: 650.0000 mg | Freq: Four times a day (QID) | RECTAL | Status: DC | PRN

## 2023-11-16 MED ORDER — IPRATROPIUM-ALBUTEROL 0.5-2.5 (3) MG/3ML IN SOLN
3.0000 mL | Freq: Once | RESPIRATORY_TRACT | Status: AC
  Administered 2023-11-16 (×2): 3 mL via RESPIRATORY_TRACT
  Filled 2023-11-16: qty 3

## 2023-11-16 MED ORDER — PREDNISONE 20 MG PO TABS: 50.0000 mg | ORAL_TABLET | Freq: Every day | ORAL | Status: DC

## 2023-11-16 MED ORDER — ACETAMINOPHEN 325 MG PO TABS
650.0000 mg | ORAL_TABLET | Freq: Four times a day (QID) | ORAL | Status: DC | PRN
  Administered 2023-11-17 (×2): 650 mg via ORAL
  Filled 2023-11-16: qty 2

## 2023-11-16 MED ORDER — HYDROCORTISONE 1 % EX CREA
1.0000 | TOPICAL_CREAM | Freq: Two times a day (BID) | CUTANEOUS | Status: DC
  Administered 2023-11-17 – 2023-11-19 (×6): 1 via TOPICAL
  Filled 2023-11-16 (×2): qty 28

## 2023-11-16 MED ORDER — NYSTATIN 100000 UNIT/ML MT SUSP: 5.0000 mL | Freq: Four times a day (QID) | OROMUCOSAL | Status: DC | PRN

## 2023-11-16 MED ORDER — ALBUTEROL SULFATE (2.5 MG/3ML) 0.083% IN NEBU
2.5000 mg | INHALATION_SOLUTION | RESPIRATORY_TRACT | Status: DC | PRN
Start: 2023-11-16 — End: 2023-11-16

## 2023-11-16 MED ORDER — SENNA 8.6 MG PO TABS: 1.0000 | ORAL_TABLET | Freq: Two times a day (BID) | ORAL | Status: DC | PRN

## 2023-11-16 MED ORDER — MECLIZINE HCL 25 MG PO TABS
25.0000 mg | ORAL_TABLET | Freq: Three times a day (TID) | ORAL | Status: DC | PRN
  Administered 2023-11-17 – 2023-11-19 (×10): 25 mg via ORAL
  Filled 2023-11-16 (×6): qty 1

## 2023-11-16 MED ORDER — FUROSEMIDE 20 MG PO TABS: 40.0000 mg | ORAL_TABLET | Freq: Every day | ORAL | Status: DC

## 2023-11-16 MED ORDER — FUROSEMIDE 10 MG/ML IJ SOLN
40.0000 mg | Freq: Once | INTRAMUSCULAR | Status: AC
  Administered 2023-11-16 (×2): 40 mg via INTRAVENOUS
  Filled 2023-11-16: qty 4

## 2023-11-16 MED ORDER — ALPRAZOLAM 0.5 MG PO TABS
1.0000 mg | ORAL_TABLET | Freq: Two times a day (BID) | ORAL | Status: DC | PRN
  Administered 2023-11-16 – 2023-11-20 (×8): 1 mg via ORAL
  Filled 2023-11-16 (×5): qty 2

## 2023-11-16 MED ORDER — SODIUM CHLORIDE 0.9 % IV SOLN
1.0000 g | INTRAVENOUS | Status: DC
  Administered 2023-11-16 (×2): 1 g via INTRAVENOUS
  Filled 2023-11-16: qty 10

## 2023-11-16 MED ORDER — LORATADINE 10 MG PO TABS
10.0000 mg | ORAL_TABLET | Freq: Every day | ORAL | Status: DC
  Administered 2023-11-17 – 2023-11-20 (×6): 10 mg via ORAL
  Filled 2023-11-16 (×4): qty 1

## 2023-11-16 MED ORDER — ASPIRIN 81 MG PO TBEC
81.0000 mg | DELAYED_RELEASE_TABLET | ORAL | Status: DC
  Administered 2023-11-18 – 2023-11-20 (×3): 81 mg via ORAL
  Filled 2023-11-16 (×2): qty 1

## 2023-11-16 MED ORDER — GUAIFENESIN ER 600 MG PO TB12
1200.0000 mg | ORAL_TABLET | Freq: Two times a day (BID) | ORAL | Status: DC
  Administered 2023-11-16 – 2023-11-20 (×13): 1200 mg via ORAL
  Filled 2023-11-16 (×8): qty 2

## 2023-11-16 MED ORDER — ACETAMINOPHEN 325 MG PO TABS
975.0000 mg | ORAL_TABLET | Freq: Once | ORAL | Status: AC
  Administered 2023-11-16 (×2): 975 mg via ORAL
  Filled 2023-11-16: qty 3

## 2023-11-16 MED ORDER — IPRATROPIUM-ALBUTEROL 0.5-2.5 (3) MG/3ML IN SOLN
3.0000 mL | Freq: Four times a day (QID) | RESPIRATORY_TRACT | Status: DC
  Administered 2023-11-16 – 2023-11-19 (×21): 3 mL via RESPIRATORY_TRACT
  Filled 2023-11-16 (×11): qty 3

## 2023-11-16 MED ORDER — METHYLPREDNISOLONE SODIUM SUCC 40 MG IJ SOLR
40.0000 mg | Freq: Two times a day (BID) | INTRAMUSCULAR | Status: DC
  Administered 2023-11-16 – 2023-11-17 (×4): 40 mg via INTRAVENOUS
  Filled 2023-11-16 (×2): qty 1

## 2023-11-16 MED ORDER — VITAMIN D 25 MCG (1000 UNIT) PO TABS
5000.0000 [IU] | ORAL_TABLET | Freq: Every day | ORAL | Status: DC
  Administered 2023-11-16 – 2023-11-19 (×7): 5000 [IU] via ORAL
  Filled 2023-11-16 (×4): qty 5

## 2023-11-16 MED ORDER — ROSUVASTATIN CALCIUM 20 MG PO TABS
20.0000 mg | ORAL_TABLET | Freq: Every day | ORAL | Status: DC
  Administered 2023-11-16 – 2023-11-19 (×7): 20 mg via ORAL
  Filled 2023-11-16 (×4): qty 1

## 2023-11-16 MED ORDER — FUROSEMIDE 40 MG PO TABS
40.0000 mg | ORAL_TABLET | Freq: Every day | ORAL | Status: DC
  Administered 2023-11-17 – 2023-11-19 (×5): 40 mg via ORAL
  Filled 2023-11-16 (×3): qty 1

## 2023-11-16 MED ORDER — STERILE WATER FOR INJECTION IJ SOLN
INTRAMUSCULAR | Status: AC
  Filled 2023-11-16: qty 10

## 2023-11-16 MED ORDER — ENOXAPARIN SODIUM 40 MG/0.4ML IJ SOSY
40.0000 mg | PREFILLED_SYRINGE | INTRAMUSCULAR | Status: DC
  Filled 2023-11-16 (×3): qty 0.4

## 2023-11-16 MED ORDER — PAROXETINE HCL 20 MG PO TABS
40.0000 mg | ORAL_TABLET | Freq: Every morning | ORAL | Status: DC
  Administered 2023-11-17 – 2023-11-20 (×6): 40 mg via ORAL
  Filled 2023-11-16 (×4): qty 2

## 2023-11-16 MED ORDER — HYDROCODONE-ACETAMINOPHEN 10-325 MG PO TABS: 1.0000 | ORAL_TABLET | Freq: Four times a day (QID) | ORAL | Status: DC | PRN

## 2023-11-16 NOTE — ED Provider Notes (Signed)
 [place] EMERGENCY DEPARTMENT AT [facility] Provider Note   CSN: [id number] Arrival date & time: 11/16/23  9044     Patient presents with: Respiratory Distress   Kim [name] is a 68 y.o. female.  With a history of COPD, HFrEF and NSTEMI who presents to the ED for shortness of breath.  Increasing shortness of breath at home for the last few days.  At home albuterol  treatments have been ineffective in providing relief.  She also notes productive coughing.  No fevers or chills.  On 2 L at home at baseline.  EMS noted her to be tachypneic and hypoxemic upon arrival.  Improvement on CPAP.  Given DuoNeb treatment and IV Solu-Medrol  prior to arrival.  Denies chest pain nausea vomiting other complaints at this time.  Reports compliance with daily Lasix .  No recent peripheral edema or appreciable weight gain   HPI     Prior to Admission medications   Medication Sig Start Date End Date Taking? Authorizing Provider  albuterol  (VENTOLIN  HFA) 108 (90 Base) MCG/ACT inhaler INHALE 2 PUFFS INTO THE LUNGS EVERY 4 TO 6 HOURS AS NEEDED FOR SHORTNESS OF BREATH OR WHEEZING Patient taking differently: Inhale 2 puffs into the lungs every 3 (three) hours as needed for wheezing or shortness of breath. 02/02/23  Yes Jude Harden GAILS, MD  ALPRAZolam  (XANAX ) 1 MG tablet TAKE 1 TABLET(1 MG) BY MOUTH THREE TIMES DAILY Patient taking differently: Take 1 mg by mouth 2 (two) times daily as needed for anxiety or sleep. 06/18/23  Yes Nafziger, Darleene, NP  aspirin  EC 81 MG EC tablet Take 1 tablet (81 mg total) by mouth daily. Patient taking differently: Take 81 mg by mouth every Monday, Wednesday, and Friday. 07/08/15  Yes Samtani, Jai-Gurmukh, MD  butalbital -acetaminophen -caffeine  (FIORICET ) 50-325-40 MG tablet Take 1 tablet by mouth every 4 (four) hours as needed for headache. TAKE 1 TABLET BY MOUTH EVERY 4 HOURS AS NEEDED FOR HEADACHETAKE 1 TABLET BY MOUTH EVERY 4 HOURS AS NEEDED FOR HEADACHE Patient taking  differently: Take 1 tablet by mouth every 4 (four) hours as needed for headache. 09/24/22  Yes Nafziger, Darleene, NP  cetirizine (ZYRTEC) 10 MG tablet Take 10 mg by mouth in the morning.   Yes [provider]  Cholecalciferol  (VITAMIN D3) 125 MCG (5000 UT) CAPS Take 5,000 Units by mouth at bedtime.   Yes [provider]  Cyanocobalamin  (VITAMIN B-12) 5000 MCG SUBL Place 5,000 mcg under the tongue at bedtime.   Yes [provider]  furosemide  (LASIX ) 40 MG tablet Take 1 tablet (40 mg total) by mouth at bedtime. 08/14/23  Yes Ghimire, Donalda HERO, MD  HYDROcodone -acetaminophen  (NORCO) 10-325 MG tablet Take 1 tablet by mouth every 6 (six) hours as needed for moderate pain (pain score 4-6) or severe pain (pain score 7-10). 11/09/23  Yes [provider]  hydrocortisone  cream 1 % Apply 1 Application topically 2 (two) times daily. 07/15/23  Yes [provider]  ipratropium-albuterol  (DUONEB) 0.5-2.5 (3) MG/3ML SOLN Take 3 mLs by nebulization every 4 (four) hours as needed. Patient taking differently: Take 3 mLs by nebulization in the morning, at noon, and at bedtime. 10/15/22 11/16/23 Yes Amin, Ankit C, MD  meclizine  (ANTIVERT ) 25 MG tablet Take 25 mg by mouth every 8 (eight) hours as needed. 11/09/23  Yes [provider]  Morphine  Sulfate (MORPHINE  CONCENTRATE) 10 mg / 0.5 ml concentrated solution Take 0.5 mLs by mouth as needed for severe pain (pain score 7-10).   Yes  [provider]  MUCINEX  MAXIMUM STRENGTH 1200 MG TB12 Take 1,200 mg by mouth every 12 (twelve) hours.   Yes [provider]  nitroGLYCERIN  (NITROSTAT ) 0.4 MG SL tablet Place 1 tablet (0.4 mg total) under the tongue every 5 (five) minutes as needed for chest pain. 10/24/20  Yes Verlin Lonni BIRCH, MD  nystatin  (MYCOSTATIN ) 100000 UNIT/ML suspension Take 5 mLs (500,000 Units total) by mouth 4 (four) times daily. Patient taking differently: Take 5 mLs by mouth 4 (four) times daily as  needed (for thrush). 11/04/22  Yes Nafziger, Darleene, NP  OXYGEN Inhale 2.5-3.5 L/min into the lungs continuous.   Yes [provider]  PARoxetine  (PAXIL ) 40 MG tablet Take 0.5 tablets (20 mg total) by mouth in the morning. TAKE 1 TABLET(40 MG) BY MOUTH EVERY MORNING Patient taking differently: Take 40 mg by mouth in the morning. 04/30/23  Yes Nafziger, Darleene, NP  predniSONE  (DELTASONE ) 10 MG tablet 4 tabs daily for 7 days then 3 tabs daily for 7 days then 2 tabs daily for 7 days then 1 tab daily Patient taking differently: Take 50 mg by mouth daily at 12 noon. Change dose as directed by provider 08/14/23  Yes Ghimire, Donalda HERO, MD  rosuvastatin  (CRESTOR ) 20 MG tablet Take 1 tablet (20 mg total) by mouth daily. Patient taking differently: Take 20 mg by mouth at bedtime. 08/05/23  Yes Verlin Lonni BIRCH, MD  SENOKOT 8.6 MG tablet Take 1 tablet by mouth 2 (two) times daily as needed for constipation.   Yes [provider]  ezetimibe  (ZETIA ) 10 MG tablet TAKE 1 TABLET(10 MG) BY MOUTH DAILY Patient not taking: Reported on 11/16/2023 01/06/23   Swinyer, Rosaline HERO, NP    Allergies: Tape, Daliresp  [roflumilast ], Penicillins, Cozaar  [losartan ], Dupixent  [dupilumab ], Latex, Omnicef  [cefdinir ], Statins, Torsemide , Trazodone  and nefazodone, and Zestril  [lisinopril ]    Review of Systems  Updated Vital Signs BP 115/74   Pulse (!) 105   Temp (!) 97.5 F (36.4 C) (Oral)   Resp (!) 23   Ht 5' 3 (1.6 m)   Wt 67.1 kg   SpO2 98%   BMI 26.22 kg/m   Physical Exam Vitals and nursing note reviewed.  HENT:     Head: Normocephalic and atraumatic.  Eyes:     Pupils: Pupils are equal, round, and reactive to light.  Cardiovascular:     Rate and Rhythm: Normal rate and regular rhythm.  Pulmonary:     Comments: Tachypnea with poor air movement No crackles or wheezing Abdominal:     Palpations: Abdomen is soft.     Tenderness: There is no abdominal tenderness.  Musculoskeletal:         General: No swelling.     Right lower leg: No edema.     Left lower leg: No edema.  Skin:    General: Skin is warm and dry.  Neurological:     Mental Status: She is alert and oriented to person, place, and time.     Sensory: No sensory deficit.     Motor: No weakness.  Psychiatric:        Mood and Affect: Mood normal.     (all labs ordered are listed, but only abnormal results are displayed) Labs Reviewed  CBC - Abnormal; Notable for the following components:      Result Value   WBC 16.7 (*)    All other components within normal limits  BRAIN NATRIURETIC PEPTIDE - Abnormal; Notable for the following components:   B  Natriuretic Peptide 151.4 (*)    All other components within normal limits  COMPREHENSIVE METABOLIC PANEL WITH GFR - Abnormal; Notable for the following components:   Glucose, Bld 282 (*)    Creatinine, Ser 1.31 (*)    GFR, Estimated 44 (*)    All other components within normal limits  I-STAT ARTERIAL BLOOD GAS, ED - Abnormal; Notable for the following components:   pH, Arterial 7.513 (*)    pCO2 arterial 31.0 (*)    pO2, Arterial 170 (*)    Potassium 3.1 (*)    All other components within normal limits  RESP PANEL BY RT-PCR (RSV, FLU A&B, COVID)  RVPGX2  MAGNESIUM   BLOOD GAS, ARTERIAL    EKG: EKG Interpretation Date/Time:  Monday November 16 2023 10:09:32 EDT Ventricular Rate:  109 PR Interval:  148 QRS Duration:  113 QT Interval:  352 QTC Calculation: 474 R Axis:   -65  Text Interpretation: Sinus tachycardia LAD, consider left anterior fascicular block ST elevation, consider inferior injury Baseline wander in lead(s) III Confirmed by Pamella Sharper [id number]) on 11/16/2023 10:37:08 AM  Radiology: DG Chest Portable 1 View Result Date: 11/16/2023 CLINICAL DATA:  SOB EXAM: PORTABLE CHEST - 1 VIEW COMPARISON:  Aug 06, 2023 FINDINGS: No focal airspace consolidation, pleural effusion, or pneumothorax. No cardiomegaly. No acute fracture or destructive lesions.  Multilevel thoracic osteophytosis. IMPRESSION: No acute cardiopulmonary abnormality. Electronically Signed   By: Rogelia Myers M.D.   On: 11/16/2023 11:31     Ultrasound ED Thoracic  Date/Time: 11/16/2023 10:41 AM  Performed by: Pamella Sharper LABOR, DO Authorized by: Pamella Sharper LABOR, DO   Procedure details:    Indications: dyspnea     Assessment for:  Pleural effusion, pneumothorax, pneumonia, interstitial syndrome and hemothorax   Left lung pleural:  Visualized   Right lung pleural:  Visualized   Images: archived   Findings:    A-lines noted throughout: not identified     B-lines present: scant Bline bilateral bases.   .Critical Care  Performed by: Pamella Sharper LABOR, DO Authorized by: Pamella Sharper LABOR, DO   Critical care provider statement:    Critical care time (minutes):  30   Critical care was necessary to treat or prevent imminent or life-threatening deterioration of the following conditions:  Respiratory failure   Critical care was time spent personally by me on the following activities:  Development of treatment plan with patient or surrogate, discussions with consultants, evaluation of patient's response to treatment, examination of patient, ordering and review of laboratory studies, ordering and review of radiographic studies, ordering and performing treatments and interventions, pulse oximetry, re-evaluation of patient's condition and review of old charts   I assumed direction of critical care for this patient from another provider in my specialty: no      Medications Ordered in the ED  ipratropium-albuterol  (DUONEB) 0.5-2.5 (3) MG/3ML nebulizer solution 3 mL (3 mLs Nebulization Given 11/16/23 1005)  furosemide  (LASIX ) injection 40 mg (40 mg Intravenous Given 11/16/23 1007)  acetaminophen  (TYLENOL ) tablet 975 mg (975 mg Oral Given 11/16/23 1256)    Clinical Course as of 11/16/23 1503  Mon Nov 16, 2023  1503 We were able to transition off of BiPAP however patient still  having some shortness of breath.  Chest x-ray looks clear.  COVID flu RSV all negative.  No significant pulmonary edema or major increases in BNP.  Most likely COPD exacerbation.  Given her clinical condition when she first came in and history of severe COPD  exacerbation will admit to medicine.  Discussed with hospitalist Dr. Claudene who accepts patient for admission [MP]    Clinical Course User Index [MP] Pamella Ozell LABOR, DO                                 Medical Decision Making 68 year old female with history as above presenting to the ED in respiratory distress.  He has both COPD and heart failure.  EMS noted her to have very quiet respirations.  Improvement in her mental status and respiratory status after initiation of CPAP administration of DuoNeb and Solu-Medrol .  Is fully awake alert oriented here stating she feels better on CPAP.  We transitioned her over to our BiPAP.  Bedside ultrasound reveals scant B-lines at bilateral bases but no overt fluid overload.  This may be mixed picture of COPD and heart failure favoring more of COPD.  Will give another DuoNeb treatment here and provide dose of Lasix .  Also need to consider pneumonia based on increased productive coughing with yellow sputum.  Will obtain laboratory workup chest x-ray EKG and continue to monitor respiratory status closely.  Admission likely.  Amount and/or Complexity of Data Reviewed Labs: ordered. Radiology: ordered.  Risk OTC drugs. Prescription drug management. Decision regarding hospitalization.        Final diagnoses:  COPD exacerbation [facility])    ED Discharge Orders     None          Pamella Ozell LABOR, DO 11/16/23 1503

## 2023-11-16 NOTE — ED Triage Notes (Signed)
 Pt from home with [facility] for resp distress since last night, worse when she woke up this morning. Hx of COPD.  Pt placed on CPAP with ems and given 125 solumedrol, 5mg  albuterol  and 0.5 atrovent .

## 2023-11-16 NOTE — H&P (Signed)
 History and Physical    Patient: Kim [name] FMW:[id number] DOB: Jan 13, 1956 DOA: 11/16/2023 DOS: the patient was seen and examined on 11/16/2023 PCP: Merna Huxley, NP  Patient coming from:  home via EMS.  Chief Complaint:  Chief Complaint  Patient presents with   Respiratory Distress   HPI: Kim [name] is a 68 y.o. female with medical history significant of  steroid-dependent COPD, chronic respiratory failure, chronic combined systolic/diastolic CHF, Takotsubo cardiomyopathy, anxiety, and CKD 3B  presents with worsening breathing difficulties. She is accompanied by her husband.  Over the last 24 hours, she has experienced worsening breathing difficulties, with wheezing noted last night when lying down and difficulty breathing upon waking this morning. Her blood pressure was significantly elevated, recorded at 215/110 mmHg in the ambulance, and her heart rate was 160 beats per minute.  She uses breathing treatments at least twice a day, sometimes three times when feeling 'real stocked up'. She has a history of taking prednisone  10 mg daily. Yesterday, she felt her breathing was not good and decided to start a prednisone  taper on her own, beginning with 5 mg this morning. She also uses a BiPAP machine at night for sleep.  She has been coughing intermittently, producing a small amount of yellowish-green sputum. No recent fever, but she notes feeling sweaty, which she attributes to prednisone  use. She has noticed bruising on her body, which she associates with the medication. No swelling in her feet, although her hands sometimes swell, but they are not swollen currently.  Records note patient had just recently been hospitalized 5/1-5/9 for acute on chronic respiratory failure secondary to COPD exacerbation.  En route with EMS patient had been placed on CPAP and given Solu-Medrol  125 mg IV and DuoNeb breathing treatment.  Infection of his left foot for plan admission into the emergency  department patient was noted to be afebrile with pulse 105-115, respiration 28, blood pressure maintained, and O2 saturations initially maintained on BiPAP.  Labs noted WBC 16.7, BUN 14, creatinine 1.31, glucose 282, and BNP 151.4.  ABG noted pH to be 7.513, PCO231, PO2 170.  Chest x-ray showed no acute abnormality.  Patient was able to be weaned to nasal cannula oxygen.  Patient was given Tylenol , furosemide  40 mg IV, and DuoNeb breathing treatment.   Review of Systems: As mentioned in the history of present illness. All other systems reviewed and are negative. 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/2008   OSTEOARTHRITIS 06/05/2009   PARESTHESIA 08/24/2007   TOBACCO ABUSE 01/25/2010   Past Surgical History:  Procedure Laterality Date   ABDOMINAL HYSTERECTOMY     APPENDECTOMY  1969   CARDIAC CATHETERIZATION N/A 07/05/2015   Procedure: Left Heart Cath and Coronary Angiography;  Surgeon: Alm LELON Clay, MD;  Location: [facility] INVASIVE CV LAB;  Service: Cardiovascular;  Laterality: N/A;   CHOLECYSTECTOMY  1989   collapse lung  1990   COLONOSCOPY WITH PROPOFOL  N/A 05/14/2018   Procedure: COLONOSCOPY WITH PROPOFOL ;  Surgeon: Abran Norleen SAILOR, MD;  Location: [facility] ENDOSCOPY;  Service: Endoscopy;  Laterality: N/A;   EXCISION MASS ABDOMINAL  2019   EXPLORATORY LAPAROTOMY  1969   LEFT HEART CATH AND CORONARY ANGIOGRAPHY N/A 11/19/2020   Procedure: LEFT HEART CATH AND CORONARY ANGIOGRAPHY;  Surgeon: Mady Bruckner, MD;  Location: [facility]  INVASIVE CV LAB;  Service: Cardiovascular;  Laterality: N/A;   POLYPECTOMY  05/14/2018   Procedure: POLYPECTOMY;  Surgeon: Abran Norleen SAILOR, MD;  Location: [facility] ENDOSCOPY;  Service: Endoscopy;;   SALPINGOOPHORECTOMY  2019   Social History:  reports that she quit smoking about 9 years  ago. Her smoking use included cigarettes. She started smoking about 47 years ago. She has a 76 pack-year smoking history. She has never used smokeless tobacco. She reports that she does not drink alcohol  and does not use drugs.  Allergies  Allergen Reactions   Tape Other (See Comments)    Skin tears easily, peels skin off   Daliresp  [Roflumilast ] Other (See Comments)    Skin peeling   Penicillins Hives   Cozaar  [Losartan ] Other (See Comments)    Possible cause of severe hypotension   Dupixent  [Dupilumab ] Diarrhea, Swelling, Other (See Comments) and Cough    Facial swelling Headache   Latex Other (See Comments)    Peels skin off   Omnicef  [Cefdinir ] Rash   Statins Other (See Comments)    Myalgia    Torsemide  Other (See Comments)    Severe stomach cramps and all over   Trazodone  And Nefazodone Other (See Comments)    Patient stated the last 2 times she took this medication gave her very bad migraine so she cut them out completely.   Zestril  [Lisinopril ] Cough    Family History  Problem Relation Age of Onset   Cancer Mother        lung   Heart attack Mother    Hypertension Sister    Multiple sclerosis Brother    Diabetes Sister    Rheum arthritis Sister    Thyroid  disease Sister    Multiple sclerosis Other    Alcohol  abuse Other    Arthritis Other    Diabetes Other    Kidney disease Other    Cancer Other        lung,ovarian,skin, uterine   Stroke Other    Heart disease Other    Melanoma Other    Osteoporosis Other    Heart attack Maternal Uncle    Heart attack Other        NEICE   Heart attack Other        NEPHEW   Hypertension Brother    Stroke Maternal Grandmother    Colon cancer Neg Hx     Prior to Admission medications   Medication Sig Start Date End Date Taking? Authorizing Provider  albuterol  (VENTOLIN  HFA) 108 (90 Base) MCG/ACT inhaler INHALE 2 PUFFS INTO THE LUNGS EVERY 4 TO 6 HOURS AS NEEDED FOR SHORTNESS OF BREATH OR WHEEZING Patient taking  differently: Inhale 2 puffs into the lungs every 3 (three) hours as needed for wheezing or shortness of breath. 02/02/23  Yes Jude Harden GAILS, MD  ALPRAZolam  (XANAX ) 1 MG tablet TAKE 1 TABLET(1 MG) BY MOUTH THREE TIMES DAILY Patient taking differently: Take 1 mg by mouth 2 (two) times daily as needed for anxiety or sleep. 06/18/23  Yes Nafziger, Darleene, NP  aspirin  EC 81 MG EC tablet Take 1 tablet (81 mg total) by mouth daily. Patient taking differently: Take 81 mg by mouth every Monday, Wednesday, and Friday. 07/08/15  Yes Samtani, Jai-Gurmukh, MD  butalbital -acetaminophen -caffeine  (FIORICET ) 50-325-40 MG tablet Take 1 tablet by mouth every 4 (four) hours as needed for headache. TAKE 1 TABLET BY MOUTH EVERY 4 HOURS AS NEEDED FOR HEADACHETAKE 1 TABLET BY MOUTH EVERY 4 HOURS AS NEEDED FOR HEADACHE Patient taking  differently: Take 1 tablet by mouth every 4 (four) hours as needed for headache. 09/24/22  Yes Nafziger, Darleene, NP  cetirizine (ZYRTEC) 10 MG tablet Take 10 mg by mouth in the morning.   Yes [provider]  Cholecalciferol  (VITAMIN D3) 125 MCG (5000 UT) CAPS Take 5,000 Units by mouth at bedtime.   Yes [provider]  Cyanocobalamin  (VITAMIN B-12) 5000 MCG SUBL Place 5,000 mcg under the tongue at bedtime.   Yes [provider]  furosemide  (LASIX ) 40 MG tablet Take 1 tablet (40 mg total) by mouth at bedtime. 08/14/23  Yes Ghimire, Donalda HERO, MD  HYDROcodone -acetaminophen  (NORCO) 10-325 MG tablet Take 1 tablet by mouth every 6 (six) hours as needed for moderate pain (pain score 4-6) or severe pain (pain score 7-10). 11/09/23  Yes [provider]  hydrocortisone  cream 1 % Apply 1 Application topically 2 (two) times daily. 07/15/23  Yes [provider]  ipratropium-albuterol  (DUONEB) 0.5-2.5 (3) MG/3ML SOLN Take 3 mLs by nebulization every 4 (four) hours as needed. Patient taking differently: Take 3 mLs by nebulization in the morning, at noon, and at bedtime. 10/15/22  11/16/23 Yes Amin, Ankit C, MD  meclizine  (ANTIVERT ) 25 MG tablet Take 25 mg by mouth every 8 (eight) hours as needed. 11/09/23  Yes [provider]  Morphine  Sulfate (MORPHINE  CONCENTRATE) 10 mg / 0.5 ml concentrated solution Take 0.5 mLs by mouth as needed for severe pain (pain score 7-10).   Yes [provider]  MUCINEX  MAXIMUM STRENGTH 1200 MG TB12 Take 1,200 mg by mouth every 12 (twelve) hours.   Yes [provider]  nitroGLYCERIN  (NITROSTAT ) 0.4 MG SL tablet Place 1 tablet (0.4 mg total) under the tongue every 5 (five) minutes as needed for chest pain. 10/24/20  Yes Verlin Lonni BIRCH, MD  nystatin  (MYCOSTATIN ) 100000 UNIT/ML suspension Take 5 mLs (500,000 Units total) by mouth 4 (four) times daily. Patient taking differently: Take 5 mLs by mouth 4 (four) times daily as needed (for thrush). 11/04/22  Yes Nafziger, Darleene, NP  OXYGEN Inhale 2.5-3.5 L/min into the lungs continuous.   Yes [provider]  PARoxetine  (PAXIL ) 40 MG tablet Take 0.5 tablets (20 mg total) by mouth in the morning. TAKE 1 TABLET(40 MG) BY MOUTH EVERY MORNING Patient taking differently: Take 40 mg by mouth in the morning. 04/30/23  Yes Nafziger, Darleene, NP  predniSONE  (DELTASONE ) 10 MG tablet 4 tabs daily for 7 days then 3 tabs daily for 7 days then 2 tabs daily for 7 days then 1 tab daily Patient taking differently: Take 50 mg by mouth daily at 12 noon. Change dose as directed by provider 08/14/23  Yes Ghimire, Donalda HERO, MD  rosuvastatin  (CRESTOR ) 20 MG tablet Take 1 tablet (20 mg total) by mouth daily. Patient taking differently: Take 20 mg by mouth at bedtime. 08/05/23  Yes Verlin Lonni BIRCH, MD  SENOKOT 8.6 MG tablet Take 1 tablet by mouth 2 (two) times daily as needed for constipation.   Yes [provider]  ezetimibe  (ZETIA ) 10 MG tablet TAKE 1 TABLET(10 MG) BY MOUTH DAILY Patient not taking: Reported on 11/16/2023 01/06/23   Percy Rosaline HERO, NP    Physical  Exam: Vitals:   11/16/23 1100 11/16/23 1200 11/16/23 1419 11/16/23 1420  BP: 121/81 107/65  115/74  Pulse: (!) 107 (!) 105  (!) 105  Resp: 19 (!) 23    Temp:   (!) 97.5 F (36.4 C)   TempSrc:   Oral  SpO2: 100% 99%  98%  Weight:      Height:        Constitutional: Elderly female who appears chronically ill currently in respiratory distress Eyes: PERRL, lids and conjunctivae normal ENMT: Mucous membranes are moist.  Fair dentition Neck: normal, supple Respiratory: Decreased overall aeration with expiratory wheezes appreciated.  Patient able to speak in short sentences currently on 3L of nasal cannula oxygen Cardiovascular: Regular rate and rhythm, no murmurs / rubs / gallops. No extremity edema. 2+ pedal pulses. No carotid bruits.  Abdomen: no tenderness, no masses palpated. Bowel sounds positive.  Musculoskeletal: no clubbing / cyanosis. No joint deformity upper and lower extremities. Good ROM, no contractures. Normal muscle tone.  Skin: Bruising noted of the upper extremities Neurologic: CN 2-12 grossly intact.  Strength 5/5 in all 4.  Psychiatric: Normal judgment and insight. Alert and oriented x 3. Normal mood.   Data Reviewed:  Sinus tachycardia at 109 bpm.  Reviewed labs, imaging, and pertinent records.   Assessment and Plan:  Acute on chronic respiratory failure with hypoxia secondary to COPD exacerbation Patient presents with progressively worsening shortness of breath over the last day.  Patient had tried increasing prednisone  dose this morning without improvement in symptoms.  Normally on 2.5-3.5 liters of oxygen at baseline.  Patient was temporarily placed on BiPAP, but has since been able to be weaned to back to 2 L.  On physical exam patient noted to have expiratory wheezes present.  Chest x-ray showed no acute abnormality.  Patient had been given additional DuoNeb breathing treatment. - Admit to a telemetry bed - Continuous pulse oximetry with oxygen to maintain O2  saturation greater than 90%. - Incentive spirometry and flutter valve - DuoNebs 4 times daily and albuterol  inhaler as needed - Solu-Medrol  IV twice daily.  Transition to prednisone  p.o. when deemed medically appropriate. - Rocephin  IV - Mucinex  - PT to eval and treat in a.m.  Leukocytosis Chronic.  WBC elevated at 16.7 which appears similar to priors.  Patient on chronic steroids at baseline. - Continue to monitor  Hypokalemia Acute.  Initial potassium noted to be 3.1. - Give 60 mEq of potassium chloride  p.o. - Continue to monitor and replace as needed   Heart failure with preserved EF Chronic.  Patient appears to be relatively euvolemic at this time.  BNP was noted to be 151.4 which was lower than prior patient in May. Echocardiogram noted EF to be 50 to 55% with indeterminate diastolic parameters when checked 08/07/2023.  Patient had been given Lasix  40 mg p.o. x 1 dose. - Strict I&O's and daily weights - Continue furosemide  40 mg p.o. tomorrow evening  Hyperlipidemia - Continue Crestor   Anxiety and depression - Continue continue Paxil  and Xanax  as needed  Chronic kidney disease stage IIIb Creatinine noted to be 1.31 which appears around patient's baseline. - Continue to monitor  OSA - Continue BiPAP nightly  DVT prophylaxis: Lovenox  Advance Care Planning:   Code Status: Limited: Do not attempt resuscitation (DNR) -DNR-LIMITED -Do Not Intubate/DNI   .  Confirmed with husband present at bedside  Consults: None Family Communication: Husband updated at bedside Severity of Illness: The appropriate patient status for this patient is INPATIENT. Inpatient status is judged to be reasonable and necessary in order to provide the required intensity of service to ensure the patient's safety. The patient's presenting symptoms, physical exam findings, and initial radiographic and laboratory data in the context of their chronic comorbidities is felt to place them at high  risk for further  clinical deterioration. Furthermore, it is not anticipated that the patient will be medically stable for discharge from the hospital within 2 midnights of admission.   * I certify that at the point of admission it is my clinical judgment that the patient will require inpatient hospital care spanning beyond 2 midnights from the point of admission due to high intensity of service, high risk for further deterioration and high frequency of surveillance required.*  Author: Maximino DELENA Sharps, MD 11/16/2023 2:53 PM  For on call review [email or web address].

## 2023-11-16 NOTE — Progress Notes (Signed)
 Latest Reference Range & Units 11/16/23 11:53  Sample type  ARTERIAL  pH, Arterial 7.35 - 7.45  7.513 (H)  pCO2 arterial 32 - 48 mmHg 31.0 (L)  pO2, Arterial 83 - 108 mmHg 170 (H)  TCO2 22 - 32 mmol/L 26  Acid-Base Excess 0.0 - 2.0 mmol/L 2.0  Bicarbonate 20.0 - 28.0 mmol/L 25.1  O2 Saturation % 100  Patient temperature  97.5 F  Pt was titrated off BiPAP to 2L [place] and is tolerating well at this time. MD notified.

## 2023-11-16 NOTE — ED Notes (Signed)
 Patient left the floor in stable condition, AOX4, with her belongings and staff.

## 2023-11-17 DIAGNOSIS — I5032 Chronic diastolic (congestive) heart failure: Secondary | ICD-10-CM

## 2023-11-17 DIAGNOSIS — J441 Chronic obstructive pulmonary disease with (acute) exacerbation: Secondary | ICD-10-CM | POA: Diagnosis not present

## 2023-11-17 DIAGNOSIS — F419 Anxiety disorder, unspecified: Secondary | ICD-10-CM

## 2023-11-17 DIAGNOSIS — N1832 Chronic kidney disease, stage 3b: Secondary | ICD-10-CM | POA: Diagnosis not present

## 2023-11-17 DIAGNOSIS — E785 Hyperlipidemia, unspecified: Secondary | ICD-10-CM

## 2023-11-17 DIAGNOSIS — F32A Depression, unspecified: Secondary | ICD-10-CM

## 2023-11-17 DIAGNOSIS — I10 Essential (primary) hypertension: Secondary | ICD-10-CM

## 2023-11-17 DIAGNOSIS — G4730 Sleep apnea, unspecified: Secondary | ICD-10-CM

## 2023-11-17 LAB — BASIC METABOLIC PANEL WITH GFR
Anion gap: 12 (ref 5–15)
BUN: 19 mg/dL (ref 8–23)
CO2: 29 mmol/L (ref 22–32)
Calcium: 10 mg/dL (ref 8.9–10.3)
Chloride: 99 mmol/L (ref 98–111)
Creatinine, Ser: 1.36 mg/dL — ABNORMAL HIGH (ref 0.44–1.00)
GFR, Estimated: 42 mL/min — ABNORMAL LOW (ref >60)
Glucose, Bld: 189 mg/dL — ABNORMAL HIGH (ref 70–99)
Potassium: 4.7 mmol/L (ref 3.5–5.1)
Sodium: 140 mmol/L (ref 135–145)

## 2023-11-17 LAB — HIV ANTIBODY (ROUTINE TESTING W REFLEX): HIV Screen 4th Generation wRfx: NONREACTIVE

## 2023-11-17 LAB — CBC
HCT: 38.3 % (ref 36.0–46.0)
Hemoglobin: 11.9 g/dL — ABNORMAL LOW (ref 12.0–15.0)
MCH: 30 pg (ref 26.0–34.0)
MCHC: 31.1 g/dL (ref 30.0–36.0)
MCV: 96.5 fL (ref 80.0–100.0)
Platelets: 315 K/uL (ref 150–400)
RBC: 3.97 MIL/uL (ref 3.87–5.11)
RDW: 14.5 % (ref 11.5–15.5)
WBC: 13.2 K/uL — ABNORMAL HIGH (ref 4.0–10.5)
nRBC: 0 % (ref 0.0–0.2)

## 2023-11-17 MED ORDER — METHYLPREDNISOLONE SODIUM SUCC 40 MG IJ SOLR
40.0000 mg | INTRAMUSCULAR | Status: DC
  Administered 2023-11-18 – 2023-11-20 (×4): 40 mg via INTRAVENOUS
  Filled 2023-11-17 (×3): qty 1

## 2023-11-17 MED ORDER — BISACODYL 5 MG PO TBEC
5.0000 mg | DELAYED_RELEASE_TABLET | Freq: Once | ORAL | Status: AC
  Administered 2023-11-17 (×2): 5 mg via ORAL
  Filled 2023-11-17: qty 1

## 2023-11-17 MED ORDER — AZITHROMYCIN 250 MG PO TABS
500.0000 mg | ORAL_TABLET | Freq: Every day | ORAL | Status: DC
  Administered 2023-11-17 – 2023-11-20 (×6): 500 mg via ORAL
  Filled 2023-11-17 (×4): qty 2

## 2023-11-17 MED ORDER — REVEFENACIN 175 MCG/3ML IN SOLN
175.0000 ug | Freq: Every day | RESPIRATORY_TRACT | Status: DC
  Administered 2023-11-17 – 2023-11-20 (×6): 175 ug via RESPIRATORY_TRACT
  Filled 2023-11-17 (×4): qty 3

## 2023-11-17 NOTE — Assessment & Plan Note (Signed)
 Continue losartan  for blood pressure control.

## 2023-11-17 NOTE — Progress Notes (Signed)
 Progress Note   Patient: Kim [name] FMW:[id number] DOB: April 28, 1955 DOA: 11/16/2023     1 DOS: the patient was seen and examined on 11/17/2023   Brief hospital course: Mrs. Carstens was admitted to the hospital with the working diagnosis of COPD exacerbation.  68 yo female with the past medical history of end stage CRecent hospitalization for COPD exacerbation 05/01 to 08/14/23, OPD, heart failure, and CKD who presented with dyspnea.   Reported acute worsening of dyspnea over the last 24 hrs prior to admission, with wheezing and orthopnea. Patient called EMS, she was found in respiratory distress, she was placed on Cpap, received 125 mg IV methylprednisolone , albuterol  neb and was transported to the ED.  On his initial physical examination her blood pressure was 121/81, HR 105, RR 23 and 02 saturation 98% on supplemental 02 Lungs with expiratory wheezing, heart with S1 and S2 present and regular with no gallops, abdomen with no distention and no lower extremity edema   Na 141, K 3.8 Cl 100 bicarbonate 28 glucose 282 bun 14 cr 1,31 AST 30 ALT 34   Wbc 13,2 hgb 11,9 plt 315  Sars COVID 19 negative Influenza negative RSV negative   Chest radiograph with hyperinflation, with no effusions or infiltrates, no cardiomegaly.   EKG 109 bpm, left axis deviation, normal intervals, qtc 474, sinus rhythm with no significant ST segment or T wave changes, poor RR wave progression.   Assessment and Plan: COPD exacerbation (HCC) Acute on chronic  hypoxemic respiratory failure.  Dyspne has improved, today 02 saturation is 99% on 3 L/min per [place].   Plan to continue bronchodilator therapy and inhaled corticosteroids.  Airway clearing techniques with flutter valve and incentive spirometer.  Continue 02 monitoring and supplemental 02 per [place] Continue Bipap for sleep.  Out of bed to chair TID, continue PT and OT.  Antitussive  agents.  Change ceftriaxone  to macrolide for 5 days.   Chronic diastolic CHF  (congestive heart failure) (HCC) 08/2023 echocardiogram with preserved LV systolic function 50 to 55%, RV systolic function preserved, no significant valvular disease. (Noted poor acoustic windows)   No signs of acute decompensation Continue blood pressure control and furosemide  40 mg daily.   Chronic kidney disease, stage 3b (HCC) Hypokalemia   Renal function with serum cr at 1,36 with K at 4,7 and serum bicarbonate at 29  Na 140   Follow up renal function in am Continue furosemide    Essential hypertension Continue blood pressure monitoring   Hyperlipidemia Continue with statin therapy   Anxiety and depression Continue with alprazolam , and paroxetine    Sleep apnea Bipap at night and for sleep during the day.      Subjective: patient with no chest pain, dyspnea has been improving but not yet back to baseline.   Physical Exam: Vitals:   11/17/23 0730 11/17/23 0754 11/17/23 1125 11/17/23 1136  BP: 137/75  (!) 117/58   Pulse: 94  83   Resp: 17  18   Temp: 97.6 F (36.4 C)  (!) 97.5 F (36.4 C)   TempSrc: Oral  Oral   SpO2: 96% 95% 97% 99%  Weight:      Height:       Neurology awake and alert ENT with mild pallor Cardiovascular with S1 and S2 present and regular with no gallops, rubs or murmurs Respiratory with prolonged expiratory phase and distant breath sounds, scattered rhonchi with no rales or wheezing.  No use of accessory respiratory muscles Abdomen with no distention, soft and non  tender No lower extremity edema   Data Reviewed:    Family Communication: I spoke with patient's husband at the bedside, we talked in detail about patient's condition, plan of care and prognosis and all questions were addressed.   Disposition: Status is: Inpatient Remains inpatient appropriate because: recovering  COPD exacerbation   Planned Discharge Destination: Home     Author: Elidia Toribio Furnace, MD 11/17/2023 1:43 PM  For on call review [email or web address].

## 2023-11-17 NOTE — TOC Initial Note (Addendum)
 Transition of Care [facility]) - Initial/Assessment Note    Patient Details  Name: Kim [name] MRN: [id number] Date of Birth: 01/10/1956  Transition [facility]) CM/SW Contact:    Waddell Barnie Rama, RN Phone Number: 11/17/2023, 11:08 AM  Clinical Narrative:                 From home with spouse, has PCP and insurance on file, states has no HH services in place at this time, but has Home Hospice with [facility] and has an aide thru them.  She has all the DME she needs thru Hospice  at home.  Also has home oxygen  2.5 liters.   States family member  (spouse) will transport them home at [employer] and family is support system, states gets medications from [facility] on [address] and [place] and would like medications to be called in to them.  SABRA Hackney self ambulatory with walker a little.   There are no IP CM needs identified  at this time.  Please place consult for IP CM  needs.  NCM contacted Suzen with [facility] to confirm she is active with them for hospice.    Expected Discharge Plan: Home w Hospice Care Barriers to Discharge: Continued Medical Work up   Patient Goals and CMS Choice Patient states their goals for this hospitalization and ongoing recovery are:: from home with spouse   Choice offered to / list presented to : NA      Expected Discharge Plan and Services   Discharge Planning Services: CM Consult Post Acute Care Choice: Hospice Living arrangements for the past 2 months: Single Family Home                   DME Agency: NA       HH Arranged: NA          Prior Living Arrangements/Services Living arrangements for the past 2 months: Single Family Home Lives with:: Spouse Patient language and need for interpreter reviewed:: Yes Do you feel safe going back to the place where you live?: Yes      Need for Family Participation in Patient Care: Yes (Comment) Care giver support system in place?: Yes (comment) Current home services: DME (has all the DME  she needs thru Hospice) Criminal Activity/Legal Involvement Pertinent to Current Situation/Hospitalization: No - Comment as needed  Activities of Daily Living   ADL Screening (condition at time of admission) Independently performs ADLs?: No Does the patient have a NEW difficulty with bathing/dressing/toileting/self-feeding that is expected to last >3 days?: No Does the patient have a NEW difficulty with getting in/out of bed, walking, or climbing stairs that is expected to last >3 days?: No Does the patient have a NEW difficulty with communication that is expected to last >3 days?: No Is the patient deaf or have difficulty hearing?: No Does the patient have difficulty seeing, even when wearing glasses/contacts?: No Does the patient have difficulty concentrating, remembering, or making decisions?: No  Permission Sought/Granted Permission sought to share information with : Case Manager Permission granted to share information with : Yes, Verbal Permission Granted  Share Information with NAME: Kim [name]  Permission granted to share info w AGENCY: Hospice  Permission granted to share info w Relationship: spouse  Permission granted to share info w Contact Information: [phone]  Emotional Assessment Appearance:: Appears stated age Attitude/Demeanor/Rapport: Engaged Affect (typically observed): Appropriate Orientation: : Oriented to Self, Oriented to Place, Oriented to  Time, Oriented to  Situation Alcohol  / Substance Use: Not Applicable Psych Involvement: No (comment)  Admission diagnosis:  COPD exacerbation (HCC) [J44.1] Patient Active Problem List   Diagnosis Date Noted   Leukocytosis 11/16/2023   Hypokalemia 11/16/2023   Chronic kidney disease, stage III (moderate) (HCC) 11/16/2023   Acute on chronic respiratory failure with hypoxia (HCC) 08/07/2023   Urinary retention 05/12/2023   Shingles rash 05/12/2023   Chronic pain syndrome 05/12/2023   COPD with acute exacerbation  (HCC) 10/09/2022   AKI (acute kidney injury) (HCC) 10/09/2022   COPD exacerbation (HCC) 09/11/2022   Acute on chronic respiratory failure with hypoxia and hypercapnia (HCC) 09/10/2022   Myalgia due to statin 06/05/2022   Medication reaction 02/12/2022   NSVT (nonsustained ventricular tachycardia) (HCC) 10/11/2021   Hyperkalemia 09/22/2021   SOB (shortness of breath) 09/19/2021   Chronic respiratory failure with hypoxia (HCC)    DNR (do not resuscitate) 09/17/2021   Double vision 08/26/2021   Acute renal failure superimposed on stage 3b chronic kidney disease (HCC) 08/26/2021   Postconcussive syndrome 08/26/2021   Elevated troponin 08/26/2021   Subarachnoid hemorrhage (HCC) 08/26/2021   Syncope and collapse 08/26/2021   (HFpEF) heart failure with preserved ejection fraction (HCC) 08/26/2021   NSTEMI (non-ST elevated myocardial infarction) (HCC) 11/17/2020   Osteoporosis 06/05/2020   Chronic HFrEF (heart failure with reduced ejection fraction) (HCC) 03/24/2020   Essential hypertension 03/24/2020   Vertebral fracture, osteoporotic (HCC) 03/05/2020   History of colonic polyps    Benign neoplasm of ascending colon    Neuritis 01/29/2017   Alpha-1-antitrypsin deficiency carrier 01/19/2017   Sleep apnea 01/14/2017   Dependent edema 01/14/2017   Orthostatic hypotension    Allergic rhinitis 08/10/2015   Acute on chronic combined systolic and diastolic CHF (congestive heart failure) (HCC)    Hyperglycemia    Coronary artery calcification seen on CAT scan 07/02/2015   Solitary pulmonary nodule 12/02/2013   Loss of weight 11/10/2013   Osteoarthritis 06/05/2009   Brachial neuritis or radiculitis 06/05/2009   Hyperlipidemia 03/12/2009   NEPHROLITHIASIS 01/05/2008   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 [place] [address] Phone: [phone] Fax: [phone]     Social Drivers of Health (SDOH) Social History: SDOH Screenings   Food Insecurity: No Food Insecurity (11/16/2023)  Housing: Low Risk  (11/16/2023)  Transportation Needs: No Transportation Needs (11/16/2023)  Utilities: Not At Risk (11/16/2023)  Alcohol  Screen: Low Risk  (03/22/2020)  Depression (PHQ2-9): Medium Risk (09/10/2021)  Financial Resource Strain: High Risk (09/10/2021)  Physical Activity: Inactive (03/25/2021)  Social Connections: Socially Isolated (11/16/2023)  Stress: No Stress Concern Present (09/10/2021)  Tobacco Use: Medium Risk (11/16/2023)   SDOH Interventions:     Readmission Risk Interventions    11/17/2023   11:05 AM 05/17/2023   12:49 PM 09/20/2021    3:27 PM  Readmission Risk Prevention Plan  Transportation Screening Complete Complete Complete  PCP or Specialist Appt within 3-5 Days Complete Complete   HRI or Home Care Consult Complete Complete   Social Work Consult for Recovery Care Planning/Counseling  Complete   Palliative Care Screening Not Applicable Not Applicable   Medication Review Oceanographer) Complete Complete  Complete  PCP or Specialist appointment within 3-5 days of discharge   Complete  HRI or Home Care Consult   Complete  SW Recovery Care/Counseling Consult   Complete  Palliative Care Screening   Not Applicable  Skilled Nursing Facility   Not Applicable

## 2023-11-17 NOTE — Assessment & Plan Note (Signed)
 Continue with alprazolam , and paroxetine 

## 2023-11-17 NOTE — Plan of Care (Signed)

## 2023-11-17 NOTE — Assessment & Plan Note (Addendum)
 Acute on chronic  hypoxemic respiratory failure.  Dyspne has improved, today 02 saturation is 99% on 3 L/min per [place].   Plan to continue bronchodilator therapy and inhaled corticosteroids.  Airway clearing techniques with flutter valve and incentive spirometer.  Continue 02 monitoring and supplemental 02 per [place] Continue Bipap for sleep.  Out of bed to chair TID, continue PT and OT.  Antitussive  agents.  Change ceftriaxone  to macrolide for 5 days.

## 2023-11-17 NOTE — Progress Notes (Incomplete)
 PROGRESS NOTE    Kim [name]  FMW:[id number] DOB: 26-Jan-1956 DOA: 11/16/2023 PCP: Merna Huxley, NP  68 yo female with the past medical history of end stage CRecent hospitalization for COPD exacerbation 05/01 to 08/14/23, OPD, heart failure, and CKD who presented with dyspnea.  X 1day with wheezing and orthopnea. Patient called EMS, > respiratory distress, she was placed on BIPAP, received 125 mg IV methylprednisolone , albuterol  neb . In ED, w expiratory wheezing, heart with S1 and S2 present and regular with no gallops, abdomen with no distention and no lower extremity edema   cr 1,31, Wbc 13,2 hgb 11,9 plt 315  Chest radiograph with hyperinflation, with no effusions or infiltrates, no cardiomegaly.    Subjective:   Assessment and Plan: COPD exacerbation (HCC) Acute on chronic  hypoxemic respiratory failure.  -slow improvement, continue bronchodilator therapy and inhaled corticosteroids.  Airway clearing techniques with flutter valve and incentive spirometer.  Continue Bipap for sleep.  Out of bed to chair TID, continue PT and OT.  Antitussive  agents.  Change ceftriaxone  to macrolide for 5 days.   Chronic diastolic CHF (congestive heart failure) (HCC) 08/2023 echocardiogram with preserved LV systolic function 50 to 55%, RV systolic function preserved, no significant valvular disease. (Noted poor acoustic windows)  No signs of acute decompensation Continue  furosemide  40 mg daily.   Chronic kidney disease, stage 3b (HCC) Hypokalemia   Renal function with serum cr at 1,36 with K at 4,7 and serum bicarbonate at 29  Na 140   Follow up renal function in am Continue furosemide    Essential hypertension Continue blood pressure monitoring   Hyperlipidemia Continue with statin therapy   Anxiety and depression Continue with alprazolam , and paroxetine    Sleep apnea Bipap at night and for sleep during the day.    DVT prophylaxis: lovenox  Code Status: DNR Family  Communication: Disposition Plan:   Consultants:    Procedures:   Antimicrobials:    Objective: Vitals:   11/17/23 0730 11/17/23 0754 11/17/23 1125 11/17/23 1136  BP: 137/75  (!) 117/58   Pulse: 94  83   Resp: 17  18   Temp: 97.6 F (36.4 C)  (!) 97.5 F (36.4 C)   TempSrc: Oral  Oral   SpO2: 96% 95% 97% 99%  Weight:      Height:        Intake/Output Summary (Last 24 hours) at 11/17/2023 1418 Last data filed at 11/17/2023 1331 Gross per 24 hour  Intake 890 ml  Output 450 ml  Net 440 ml   Filed Weights   11/16/23 1005 11/16/23 1800 11/17/23 0437  Weight: 67.1 kg 66.6 kg 67.7 kg    Examination:     Data Reviewed:   CBC: Recent Labs  Lab 11/16/23 1002 11/16/23 1153 11/17/23 0255  WBC 16.7*  --  13.2*  HGB 12.6 12.2 11.9*  HCT 41.4 36.0 38.3  MCV 97.6  --  96.5  PLT 348  --  315   Basic Metabolic Panel: Recent Labs  Lab 11/16/23 1002 11/16/23 1004 11/16/23 1153 11/17/23 0255  NA  --  141 139 140  K  --  3.8 3.1* 4.7  CL  --  100  --  99  CO2  --  28  --  29  GLUCOSE  --  282*  --  189*  BUN  --  14  --  19  CREATININE  --  1.31*  --  1.36*  CALCIUM   --  9.3  --  10.0  MG 2.3  --   --   --    GFR: Estimated Creatinine Clearance: 34.4 mL/min (A) (by C-G formula based on SCr of 1.36 mg/dL (H)). Liver Function Tests: Recent Labs  Lab 11/16/23 1004  AST 30  ALT 24  ALKPHOS 53  BILITOT 0.3  PROT 6.9  ALBUMIN 3.5   No results for input(s): LIPASE, AMYLASE in the last 168 hours. No results for input(s): AMMONIA in the last 168 hours. Coagulation Profile: No results for input(s): INR, PROTIME in the last 168 hours. Cardiac Enzymes: No results for input(s): CKTOTAL, CKMB, CKMBINDEX, TROPONINI in the last 168 hours. BNP (last 3 results) Recent Labs    04/23/23 1547  PROBNP 337*   HbA1C: No results for input(s): HGBA1C in the last 72 hours. CBG: No results for input(s): GLUCAP in the last 168 hours. Lipid  Profile: No results for input(s): CHOL, HDL, LDLCALC, TRIG, CHOLHDL, LDLDIRECT in the last 72 hours. Thyroid  Function Tests: No results for input(s): TSH, T4TOTAL, FREET4, T3FREE, THYROIDAB in the last 72 hours. Anemia Panel: No results for input(s): VITAMINB12, FOLATE, FERRITIN, TIBC, IRON, RETICCTPCT in the last 72 hours. Urine analysis:    Component Value Date/Time   COLORURINE YELLOW 08/07/2023 0648   APPEARANCEUR CLEAR 08/07/2023 0648   LABSPEC 1.011 08/07/2023 0648   PHURINE 5.0 08/07/2023 0648   GLUCOSEU NEGATIVE 08/07/2023 0648   HGBUR MODERATE (A) 08/07/2023 0648   HGBUR trace-lysed 02/26/2010 0951   BILIRUBINUR NEGATIVE 08/07/2023 0648   BILIRUBINUR negative 04/29/2021 1243   KETONESUR NEGATIVE 08/07/2023 0648   PROTEINUR NEGATIVE 08/07/2023 0648   UROBILINOGEN 0.2 04/29/2021 1243   UROBILINOGEN 0.2 02/26/2010 0951   NITRITE NEGATIVE 08/07/2023 0648   LEUKOCYTESUR SMALL (A) 08/07/2023 0648   Sepsis Labs: @LABRCNTIP (procalcitonin:4,lacticidven:4)  ) Recent Results (from the past 240 hours)  Resp panel by RT-PCR (RSV, Flu A&B, Covid) Anterior Nasal Swab     Status: None   Collection Time: 11/16/23 10:00 AM   Specimen: Anterior Nasal Swab  Result Value Ref Range Status   SARS Coronavirus 2 by RT PCR NEGATIVE NEGATIVE Final   Influenza A by PCR NEGATIVE NEGATIVE Final   Influenza B by PCR NEGATIVE NEGATIVE Final    Comment: (NOTE) The Xpert Xpress SARS-CoV-2/FLU/RSV plus assay is intended as an aid in the diagnosis of influenza from Nasopharyngeal swab specimens and should not be used as a sole basis for treatment. Nasal washings and aspirates are unacceptable for Xpert Xpress SARS-CoV-2/FLU/RSV testing.  Fact Sheet for Patients: BloggerCourse.com  Fact Sheet for Healthcare Providers: SeriousBroker.it  This test is not yet approved or cleared by the [place]  FDA and has  been authorized for detection and/or diagnosis of SARS-CoV-2 by FDA under an Emergency Use Authorization (EUA). This EUA will remain in effect (meaning this test can be used) for the duration of the COVID-19 declaration under Section 564(b)(1) of the Act, 21 U.S.C. section 360bbb-3(b)(1), unless the authorization is terminated or revoked.     Resp Syncytial Virus by PCR NEGATIVE NEGATIVE Final    Comment: (NOTE) Fact Sheet for Patients: BloggerCourse.com  Fact Sheet for Healthcare Providers: SeriousBroker.it  This test is not yet approved or cleared by the [place]  FDA and has been authorized for detection and/or diagnosis of SARS-CoV-2 by FDA under an Emergency Use Authorization (EUA). This EUA will remain in effect (meaning this test can be used) for the duration of the COVID-19 declaration under Section 564(b)(1) of the Act, 21 U.S.C. section 360bbb-3(b)(1), unless  the authorization is terminated or revoked.  Performed at [facility], [address], [place], [place] [address]      Radiology Studies: DG Chest Portable 1 View Result Date: 11/16/2023 CLINICAL DATA:  SOB EXAM: PORTABLE CHEST - 1 VIEW COMPARISON:  Aug 06, 2023 FINDINGS: No focal airspace consolidation, pleural effusion, or pneumothorax. No cardiomegaly. No acute fracture or destructive lesions. Multilevel thoracic osteophytosis. IMPRESSION: No acute cardiopulmonary abnormality. Electronically Signed   By: Rogelia Myers M.D.   On: 11/16/2023 11:31     Scheduled Meds:  [START ON 11/18/2023] aspirin  EC  81 mg Oral Q M,W,F   azithromycin   500 mg Oral Daily   bisacodyl   5 mg Oral Once   cholecalciferol   5,000 Units Oral QHS   enoxaparin  (LOVENOX ) injection  40 mg Subcutaneous Q24H   furosemide   40 mg Oral QHS   guaiFENesin   1,200 mg Oral Q12H   hydrocortisone  cream  1 Application Topical BID   ipratropium-albuterol   3 mL Nebulization QID   loratadine    10 mg Oral Daily   [START ON 11/18/2023] methylPREDNISolone  (SOLU-MEDROL ) injection  40 mg Intravenous Q24H   PARoxetine   40 mg Oral q AM   revefenacin   175 mcg Nebulization Daily   rosuvastatin   20 mg Oral QHS   sodium chloride  flush  3 mL Intravenous Q12H   Continuous Infusions:   LOS: 1 day    Time spent:     Sigurd Pac, MD [facility]   11/17/2023, 2:18 PM

## 2023-11-17 NOTE — Assessment & Plan Note (Signed)
 08/2023 echocardiogram with preserved LV systolic function 50 to 55%, RV systolic function preserved, no significant valvular disease. (Noted poor acoustic windows)   No signs of acute decompensation Continue blood pressure control and furosemide  40 mg daily.

## 2023-11-17 NOTE — Hospital Course (Addendum)
 Kim [name] was admitted to the hospital with the working diagnosis of COPD exacerbation.  68 yo female with the past medical history of end stage CRecent hospitalization for COPD exacerbation 05/01 to 08/14/23, OPD, heart failure, and CKD who presented with dyspnea.   Reported acute worsening of dyspnea over the last 24 hrs prior to admission, with wheezing and orthopnea. Patient called EMS, she was found in respiratory distress, she was placed on Cpap, received 125 mg IV methylprednisolone , albuterol  neb and was transported to the ED.  On his initial physical examination her blood pressure was 121/81, HR 105, RR 23 and 02 saturation 98% on supplemental 02 Lungs with expiratory wheezing, heart with S1 and S2 present and regular with no gallops, abdomen with no distention and no lower extremity edema   Na 141, K 3.8 Cl 100 bicarbonate 28 glucose 282 bun 14 cr 1,31 AST 30 ALT 34   Wbc 13,2 hgb 11,9 plt 315  Sars COVID 19 negative Influenza negative RSV negative   Chest radiograph with hyperinflation, with no effusions or infiltrates, no cardiomegaly.   EKG 109 bpm, left axis deviation, normal intervals, qtc 474, sinus rhythm with no significant ST segment or T wave changes, poor RR wave progression.

## 2023-11-17 NOTE — Assessment & Plan Note (Signed)
 Hypokalemia   Renal function with serum cr at 1,36 with K at 4,7 and serum bicarbonate at 29  Na 140   Follow up renal function in am Continue furosemide 

## 2023-11-17 NOTE — Plan of Care (Signed)
  Problem: Pain Managment: Goal: General experience of comfort will improve and/or be controlled Outcome: Progressing   Problem: Safety: Goal: Ability to remain free from injury will improve Outcome: Progressing   Problem: Education: Goal: Knowledge of disease or condition will improve Outcome: Progressing

## 2023-11-17 NOTE — Assessment & Plan Note (Signed)
 Bipap at night and for sleep during the day.

## 2023-11-17 NOTE — Assessment & Plan Note (Signed)
 Continue with statin therapy.  ?

## 2023-11-17 NOTE — Progress Notes (Signed)
 PT Cancellation Note  Patient Details Name: Kim [name] MRN: [id number] DOB: 1955/05/04   Cancelled Treatment:    Reason Eval/Treat Not Completed: PT screened, no needs identified, will sign off. Pt reports she feels like she is moving at her baseline during admission and that she is followed by hospice at home so has all needed DME and assistance. Will screen per pt request, please feel free to re-consult if change in pt desire or mobility.   Kim [name], PT, DPT   Acute Rehabilitation Department Office [phone] Secure Chat Communication Preferred   Kim [name] 11/17/2023, 10:32 AM

## 2023-11-18 DIAGNOSIS — J441 Chronic obstructive pulmonary disease with (acute) exacerbation: Secondary | ICD-10-CM | POA: Diagnosis not present

## 2023-11-18 LAB — BASIC METABOLIC PANEL WITH GFR
Anion gap: 10 (ref 5–15)
BUN: 25 mg/dL — ABNORMAL HIGH (ref 8–23)
CO2: 32 mmol/L (ref 22–32)
Calcium: 10.3 mg/dL (ref 8.9–10.3)
Chloride: 101 mmol/L (ref 98–111)
Creatinine, Ser: 1.36 mg/dL — ABNORMAL HIGH (ref 0.44–1.00)
GFR, Estimated: 42 mL/min — ABNORMAL LOW (ref >60)
Glucose, Bld: 135 mg/dL — ABNORMAL HIGH (ref 70–99)
Potassium: 4.1 mmol/L (ref 3.5–5.1)
Sodium: 143 mmol/L (ref 135–145)

## 2023-11-18 MED ORDER — BUDESONIDE 0.25 MG/2ML IN SUSP
0.2500 mg | Freq: Two times a day (BID) | RESPIRATORY_TRACT | Status: DC
  Administered 2023-11-18 – 2023-11-20 (×5): 0.25 mg via RESPIRATORY_TRACT
  Filled 2023-11-18 (×4): qty 2

## 2023-11-18 MED ORDER — ARFORMOTEROL TARTRATE 15 MCG/2ML IN NEBU
15.0000 ug | INHALATION_SOLUTION | Freq: Two times a day (BID) | RESPIRATORY_TRACT | Status: DC
  Administered 2023-11-18 – 2023-11-20 (×5): 15 ug via RESPIRATORY_TRACT
  Filled 2023-11-18 (×4): qty 2

## 2023-11-18 NOTE — Progress Notes (Signed)
 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  She has noticed increasing wheezing.  Chest x-ray was negative for infiltrates, cardiomegaly or hyperinflation.  She was admitted with COPD exacerbation  Subjective / 24h Interval events: She is feeling better today.  Appreciates the wheezing is improving  Assesement and Plan: Active Problems:   COPD exacerbation (HCC)   Chronic diastolic CHF (congestive heart failure) (HCC)   Essential hypertension   Chronic kidney disease, stage 3b (HCC)   Hyperlipidemia   Anxiety and depression   Sleep apnea   Principal problem Acute on chronic hypoxic respiratory failure in the setting of underlying COPD exacerbation-she is feeling better today, improving.  Has been started on steroids, azithromycin , nebulizers.  Continue.  Add Brovana  and Pulmicort  - Continue supportive care, she is end-stage COPD and is established with home with hospice  Active problems HTN, chronic diastolic CHF-continue furosemide   CKD 3B-creatinine at baseline  Hyperlipidemia-continue statin  Anxiety/depression-continue home medications  OSA-nightly BiPAP  Scheduled Meds:  aspirin  EC  81 mg Oral Q M,W,F   azithromycin   500 mg Oral Daily   cholecalciferol   5,000 Units Oral QHS   enoxaparin  (LOVENOX ) injection  40 mg Subcutaneous Q24H   furosemide   40 mg Oral QHS   guaiFENesin   1,200 mg Oral Q12H   hydrocortisone  cream  1 Application Topical BID   ipratropium-albuterol   3 mL Nebulization QID   loratadine   10 mg Oral Daily   methylPREDNISolone  (SOLU-MEDROL ) injection  40 mg Intravenous Q24H   PARoxetine   40 mg Oral q AM   revefenacin   175 mcg Nebulization Daily   rosuvastatin   20 mg Oral QHS   sodium chloride  flush   3 mL Intravenous Q12H   Continuous Infusions: PRN Meds:.acetaminophen  **OR** acetaminophen , albuterol , ALPRAZolam , HYDROcodone -acetaminophen , meclizine , nystatin , senna  Current Outpatient Medications  Medication Instructions   albuterol  (VENTOLIN  HFA) 108 (90 Base) MCG/ACT inhaler INHALE 2 PUFFS INTO THE LUNGS EVERY 4 TO 6 HOURS AS NEEDED FOR SHORTNESS OF BREATH OR WHEEZING   ALPRAZolam  (XANAX ) 1 MG tablet TAKE 1 TABLET(1 MG) BY MOUTH THREE TIMES DAILY   aspirin  EC 81 mg, Oral, Daily   butalbital -acetaminophen -caffeine  (FIORICET ) 50-325-40 MG tablet 1 tablet, Oral, Every 4 hours PRN, TAKE 1 TABLET BY MOUTH EVERY 4 HOURS AS NEEDED FOR HEADACHETAKE 1 TABLET BY MOUTH EVERY 4 HOURS AS NEEDED FOR HEADACHE   cetirizine (ZYRTEC) 10 mg, Oral, Every morning   furosemide  (LASIX ) 40 mg, Oral, Daily at bedtime   HYDROcodone -acetaminophen  (NORCO) 10-325 MG tablet 1 tablet, Oral, Every 6 hours PRN   hydrocortisone  cream 1 % 1 Application, Topical, 2 times daily   ipratropium-albuterol  (DUONEB) 0.5-2.5 (3) MG/3ML SOLN 3 mLs, Nebulization, Every 4 hours PRN   meclizine  (ANTIVERT ) 25 mg, Oral, Every 8 hours PRN   Morphine  Sulfate (MORPHINE  CONCENTRATE) 10 mg / 0.5 ml concentrated solution 0.5 mLs, Oral, As needed   Mucinex  Maximum Strength 1,200 mg, Oral, Every 12 hours   nitroGLYCERIN  (NITROSTAT ) 0.4 mg, Sublingual, Every 5 min PRN   nystatin  (MYCOSTATIN ) 500,000 Units, Oral, 4 times daily   OXYGEN 2.5-3.5 L/min, Inhalation, Continuous   PARoxetine  (PAXIL ) 20 mg, Oral, Every morning, TAKE 1 TABLET(40 MG) BY MOUTH EVERY MORNING   predniSONE  (DELTASONE ) 10 MG tablet 4 tabs  daily for 7 days then 3 tabs daily for 7 days then 2 tabs daily for 7 days then 1 tab daily   rosuvastatin  (CRESTOR ) 20 mg, Oral, Daily   SENOKOT 8.6 MG tablet 1 tablet, Oral, 2 times daily PRN   Vitamin B-12 5,000 mcg, Sublingual, Daily at bedtime   Vitamin D3 5,000 Units, Oral, Daily at bedtime    Diet Orders (From admission,  onward)     Start     Ordered   11/16/23 1508  Diet Heart Room service appropriate? Yes; Fluid consistency: Thin  Diet effective now       Question Answer Comment  Room service appropriate? Yes   Fluid consistency: Thin      11/16/23 1510            DVT prophylaxis: enoxaparin  (LOVENOX ) injection 40 mg Start: 11/16/23 1600   Lab Results  Component Value Date   PLT 315 11/17/2023      Code Status: Limited: Do not attempt resuscitation (DNR) -DNR-LIMITED -Do Not Intubate/DNI   Family Communication: Husband at bedside  Status is: Inpatient Remains inpatient appropriate because: Severity of illness   Level of care: Telemetry Medical  Consultants:  None  Objective: Vitals:   11/18/23 0705 11/18/23 0751 11/18/23 1120 11/18/23 1156  BP:  138/70  126/65  Pulse:  84  99  Resp:  20  19  Temp:  (!) 97.4 F (36.3 C)  (!) 97.5 F (36.4 C)  TempSrc:  Oral  Oral  SpO2: 98% 98% 98% 97%  Weight:      Height:        Intake/Output Summary (Last 24 hours) at 11/18/2023 1321 Last data filed at 11/18/2023 0900 Gross per 24 hour  Intake 340 ml  Output 1500 ml  Net -1160 ml   Wt Readings from Last 3 Encounters:  11/18/23 68.4 kg  08/14/23 75.7 kg  06/16/23 72.4 kg    Examination:  Constitutional: NAD Eyes: no scleral icterus ENMT: Mucous membranes are moist.  Neck: normal, supple Respiratory: Expiratory wheezing present, faint.  No crackles Cardiovascular: Regular rate and rhythm, no murmurs / rubs / gallops. No LE edema.  Abdomen: non distended, no tenderness. Bowel sounds positive.  Musculoskeletal: no clubbing / cyanosis.   Data Reviewed: I have independently reviewed following labs and imaging studies   CBC Recent Labs  Lab 11/16/23 1002 11/16/23 1153 11/17/23 0255  WBC 16.7*  --  13.2*  HGB 12.6 12.2 11.9*  HCT 41.4 36.0 38.3  PLT 348  --  315  MCV 97.6  --  96.5  MCH 29.7  --  30.0  MCHC 30.4  --  31.1  RDW 14.3  --  14.5    Recent Labs  Lab  11/16/23 1002 11/16/23 1004 11/16/23 1153 11/17/23 0255 11/18/23 0242  NA  --  141 139 140 143  K  --  3.8 3.1* 4.7 4.1  CL  --  100  --  99 101  CO2  --  28  --  29 32  GLUCOSE  --  282*  --  189* 135*  BUN  --  14  --  19 25*  CREATININE  --  1.31*  --  1.36* 1.36*  CALCIUM   --  9.3  --  10.0 10.3  AST  --  30  --   --   --   ALT  --  24  --   --   --   ALKPHOS  --  53  --   --   --   BILITOT  --  0.3  --   --   --   ALBUMIN  --  3.5  --   --   --   MG 2.3  --   --   --   --   BNP 151.4*  --   --   --   --     ------------------------------------------------------------------------------------------------------------------ No results for input(s): CHOL, HDL, LDLCALC, TRIG, CHOLHDL, LDLDIRECT in the last 72 hours.  Lab Results  Component Value Date   HGBA1C 6.1 (H) 07/14/2022   ------------------------------------------------------------------------------------------------------------------ No results for input(s): TSH, T4TOTAL, T3FREE, THYROIDAB in the last 72 hours.  Invalid input(s): FREET3  Cardiac Enzymes No results for input(s): CKMB, TROPONINI, MYOGLOBIN in the last 168 hours.  Invalid input(s): CK ------------------------------------------------------------------------------------------------------------------    Component Value Date/Time   BNP 151.4 (H) 11/16/2023 1002    CBG: No results for input(s): GLUCAP in the last 168 hours.  Recent Results (from the past 240 hours)  Resp panel by RT-PCR (RSV, Flu A&B, Covid) Anterior Nasal Swab     Status: None   Collection Time: 11/16/23 10:00 AM   Specimen: Anterior Nasal Swab  Result Value Ref Range Status   SARS Coronavirus 2 by RT PCR NEGATIVE NEGATIVE Final   Influenza A by PCR NEGATIVE NEGATIVE Final   Influenza B by PCR NEGATIVE NEGATIVE Final    Comment: (NOTE) The Xpert Xpress SARS-CoV-2/FLU/RSV plus assay is intended as an aid in the diagnosis of influenza from  Nasopharyngeal swab specimens and should not be used as a sole basis for treatment. Nasal washings and aspirates are unacceptable for Xpert Xpress SARS-CoV-2/FLU/RSV testing.  Fact Sheet for Patients: BloggerCourse.com  Fact Sheet for Healthcare Providers: SeriousBroker.it  This test is not yet approved or cleared by the [place]  FDA and has been authorized for detection and/or diagnosis of SARS-CoV-2 by FDA under an Emergency Use Authorization (EUA). This EUA will remain in effect (meaning this test can be used) for the duration of the COVID-19 declaration under Section 564(b)(1) of the Act, 21 U.S.C. section 360bbb-3(b)(1), unless the authorization is terminated or revoked.     Resp Syncytial Virus by PCR NEGATIVE NEGATIVE Final    Comment: (NOTE) Fact Sheet for Patients: BloggerCourse.com  Fact Sheet for Healthcare Providers: SeriousBroker.it  This test is not yet approved or cleared by the [place]  FDA and has been authorized for detection and/or diagnosis of SARS-CoV-2 by FDA under an Emergency Use Authorization (EUA). This EUA will remain in effect (meaning this test can be used) for the duration of the COVID-19 declaration under Section 564(b)(1) of the Act, 21 U.S.C. section 360bbb-3(b)(1), unless the authorization is terminated or revoked.  Performed at [facility], [address], [place], [place] [address]      Radiology Studies: No results found.   Nilda Fendt, MD, PhD [facility]  Between 7 am - 7 pm I am available, please contact me via Amion (for emergencies) or Securechat (non urgent messages)  Between 7 pm - 7 am I am not available, please contact night coverage MD/APP via Amion

## 2023-11-18 NOTE — Plan of Care (Signed)
  Problem: Clinical Measurements: Goal: Will remain free from infection Outcome: Progressing Goal: Respiratory complications will improve Outcome: Progressing Goal: Cardiovascular complication will be avoided Outcome: Progressing   Problem: Coping: Goal: Level of anxiety will decrease Outcome: Progressing   Problem: Safety: Goal: Ability to remain free from injury will improve Outcome: Progressing   Problem: Respiratory: Goal: Ability to maintain a clear airway will improve Outcome: Progressing

## 2023-11-19 ENCOUNTER — Other Ambulatory Visit (HOSPITAL_COMMUNITY): Payer: Self-pay

## 2023-11-19 DIAGNOSIS — J441 Chronic obstructive pulmonary disease with (acute) exacerbation: Secondary | ICD-10-CM | POA: Diagnosis not present

## 2023-11-19 LAB — CBC
HCT: 37.7 % (ref 36.0–46.0)
Hemoglobin: 11.6 g/dL — ABNORMAL LOW (ref 12.0–15.0)
MCH: 29.6 pg (ref 26.0–34.0)
MCHC: 30.8 g/dL (ref 30.0–36.0)
MCV: 96.2 fL (ref 80.0–100.0)
Platelets: 321 K/uL (ref 150–400)
RBC: 3.92 MIL/uL (ref 3.87–5.11)
RDW: 14.8 % (ref 11.5–15.5)
WBC: 13.9 K/uL — ABNORMAL HIGH (ref 4.0–10.5)
nRBC: 0 % (ref 0.0–0.2)

## 2023-11-19 LAB — COMPREHENSIVE METABOLIC PANEL WITH GFR
ALT: 20 U/L (ref 0–44)
AST: 25 U/L (ref 15–41)
Albumin: 3.3 g/dL — ABNORMAL LOW (ref 3.5–5.0)
Alkaline Phosphatase: 50 U/L (ref 38–126)
Anion gap: 14 (ref 5–15)
BUN: 27 mg/dL — ABNORMAL HIGH (ref 8–23)
CO2: 28 mmol/L (ref 22–32)
Calcium: 9.6 mg/dL (ref 8.9–10.3)
Chloride: 95 mmol/L — ABNORMAL LOW (ref 98–111)
Creatinine, Ser: 1.41 mg/dL — ABNORMAL HIGH (ref 0.44–1.00)
GFR, Estimated: 40 mL/min — ABNORMAL LOW (ref >60)
Glucose, Bld: 265 mg/dL — ABNORMAL HIGH (ref 70–99)
Potassium: 3.7 mmol/L (ref 3.5–5.1)
Sodium: 137 mmol/L (ref 135–145)
Total Bilirubin: 0.4 mg/dL (ref 0.0–1.2)
Total Protein: 5.7 g/dL — ABNORMAL LOW (ref 6.5–8.1)

## 2023-11-19 LAB — MAGNESIUM: Magnesium: 1.9 mg/dL (ref 1.7–2.4)

## 2023-11-19 LAB — PHOSPHORUS: Phosphorus: 2.8 mg/dL (ref 2.5–4.6)

## 2023-11-19 MED ORDER — IPRATROPIUM-ALBUTEROL 0.5-2.5 (3) MG/3ML IN SOLN
3.0000 mL | Freq: Three times a day (TID) | RESPIRATORY_TRACT | Status: DC
  Administered 2023-11-19 – 2023-11-20 (×3): 3 mL via RESPIRATORY_TRACT
  Filled 2023-11-19 (×3): qty 3

## 2023-11-19 NOTE — Progress Notes (Signed)
 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  She has noticed increasing wheezing.  Chest x-ray was negative for infiltrates, cardiomegaly or hyperinflation.  She was admitted with COPD exacerbation  Subjective / 24h Interval events: Feeling better, almost back to baseline  Assesement and Plan: Active Problems:   COPD exacerbation (HCC)   Chronic diastolic CHF (congestive heart failure) (HCC)   Essential hypertension   Chronic kidney disease, stage 3b (HCC)   Hyperlipidemia   Anxiety and depression   Sleep apnea   Principal problem Acute on chronic hypoxic respiratory failure in the setting of underlying COPD exacerbation-she is feeling better today, improving.  Has been started on steroids, azithromycin , nebulizers.  Continue.  Add Brovana  and Pulmicort  - Continue supportive care, she is end-stage COPD and is established with home with hospice - Likely will benefit from Trelegy or Breztri  at home as I do not see any maintenance therapy, will check benefits  Active problems HTN, chronic diastolic CHF-continue furosemide   CKD 3B-creatinine at baseline  Hyperlipidemia-continue statin  Anxiety/depression-continue home medications  OSA-nightly BiPAP  Scheduled Meds:  arformoterol   15 mcg Nebulization BID   aspirin  EC  81 mg Oral Q M,W,F   azithromycin   500 mg Oral Daily   budesonide  (PULMICORT ) nebulizer solution  0.25 mg Nebulization BID   cholecalciferol   5,000 Units Oral QHS   enoxaparin  (LOVENOX ) injection  40 mg Subcutaneous Q24H   furosemide   40 mg Oral QHS   guaiFENesin   1,200 mg Oral Q12H   hydrocortisone  cream  1 Application Topical BID   ipratropium-albuterol   3 mL Nebulization TID   loratadine   10 mg Oral Daily    methylPREDNISolone  (SOLU-MEDROL ) injection  40 mg Intravenous Q24H   PARoxetine   40 mg Oral q AM   revefenacin   175 mcg Nebulization Daily   rosuvastatin   20 mg Oral QHS   sodium chloride  flush  3 mL Intravenous Q12H   Continuous Infusions: PRN Meds:.acetaminophen  **OR** acetaminophen , albuterol , ALPRAZolam , HYDROcodone -acetaminophen , meclizine , nystatin , senna  Current Outpatient Medications  Medication Instructions   albuterol  (VENTOLIN  HFA) 108 (90 Base) MCG/ACT inhaler INHALE 2 PUFFS INTO THE LUNGS EVERY 4 TO 6 HOURS AS NEEDED FOR SHORTNESS OF BREATH OR WHEEZING   ALPRAZolam  (XANAX ) 1 MG tablet TAKE 1 TABLET(1 MG) BY MOUTH THREE TIMES DAILY   aspirin  EC 81 mg, Oral, Daily   butalbital -acetaminophen -caffeine  (FIORICET ) 50-325-40 MG tablet 1 tablet, Oral, Every 4 hours PRN, TAKE 1 TABLET BY MOUTH EVERY 4 HOURS AS NEEDED FOR HEADACHETAKE 1 TABLET BY MOUTH EVERY 4 HOURS AS NEEDED FOR HEADACHE   cetirizine (ZYRTEC) 10 mg, Oral, Every morning   furosemide  (LASIX ) 40 mg, Oral, Daily at bedtime   HYDROcodone -acetaminophen  (NORCO) 10-325 MG tablet 1 tablet, Oral, Every 6 hours PRN   hydrocortisone  cream 1 % 1 Application, Topical, 2 times daily   ipratropium-albuterol  (DUONEB) 0.5-2.5 (3) MG/3ML SOLN 3 mLs, Nebulization, Every 4 hours PRN   meclizine  (ANTIVERT ) 25 mg, Oral, Every 8 hours PRN   Morphine  Sulfate (MORPHINE  CONCENTRATE) 10 mg / 0.5 ml concentrated solution 0.5 mLs, Oral, As needed   Mucinex  Maximum Strength 1,200 mg, Oral, Every 12 hours   nitroGLYCERIN  (NITROSTAT ) 0.4 mg, Sublingual, Every 5 min PRN   nystatin  (MYCOSTATIN ) 500,000 Units, Oral, 4  times daily   OXYGEN 2.5-3.5 L/min, Inhalation, Continuous   PARoxetine  (PAXIL ) 20 mg, Oral, Every morning, TAKE 1 TABLET(40 MG) BY MOUTH EVERY MORNING   predniSONE  (DELTASONE ) 10 MG tablet 4 tabs daily for 7 days then 3 tabs daily for 7 days then 2 tabs daily for 7 days then 1 tab daily   rosuvastatin  (CRESTOR ) 20 mg, Oral, Daily    SENOKOT 8.6 MG tablet 1 tablet, Oral, 2 times daily PRN   Vitamin B-12 5,000 mcg, Sublingual, Daily at bedtime   Vitamin D3 5,000 Units, Oral, Daily at bedtime    Diet Orders (From admission, onward)     Start     Ordered   11/16/23 1508  Diet Heart Room service appropriate? Yes; Fluid consistency: Thin  Diet effective now       Question Answer Comment  Room service appropriate? Yes   Fluid consistency: Thin      11/16/23 1510            DVT prophylaxis: enoxaparin  (LOVENOX ) injection 40 mg Start: 11/16/23 1600   Lab Results  Component Value Date   PLT 321 11/19/2023      Code Status: Limited: Do not attempt resuscitation (DNR) -DNR-LIMITED -Do Not Intubate/DNI   Family Communication: Husband at bedside  Status is: Inpatient Remains inpatient appropriate because: Severity of illness   Level of care: Telemetry Medical  Consultants:  None  Objective: Vitals:   11/19/23 0015 11/19/23 0711 11/19/23 0716 11/19/23 0841  BP: 134/74   129/80  Pulse: 88   91  Resp: 20   (!) 21  Temp: 99.3 F (37.4 C)   (!) 97.2 F (36.2 C)  TempSrc: Axillary   Oral  SpO2: 100% 94% 94% 93%  Weight: 67.6 kg     Height:        Intake/Output Summary (Last 24 hours) at 11/19/2023 1144 Last data filed at 11/19/2023 0844 Gross per 24 hour  Intake 940 ml  Output 1150 ml  Net -210 ml   Wt Readings from Last 3 Encounters:  11/19/23 67.6 kg  08/14/23 75.7 kg  06/16/23 72.4 kg    Examination:  Constitutional: NAD Eyes: lids and conjunctivae normal, no scleral icterus ENMT: mmm Neck: normal, supple Respiratory: clear to auscultation bilaterally, no wheezing, no crackles.  Cardiovascular: Regular rate and rhythm, no murmurs / rubs / gallops.  Abdomen: soft, no distention, no tenderness. Bowel sounds positive.   Data Reviewed: I have independently reviewed following labs and imaging studies   CBC Recent Labs  Lab 11/16/23 1002 11/16/23 1153 11/17/23 0255 11/19/23 0320   WBC 16.7*  --  13.2* 13.9*  HGB 12.6 12.2 11.9* 11.6*  HCT 41.4 36.0 38.3 37.7  PLT 348  --  315 321  MCV 97.6  --  96.5 96.2  MCH 29.7  --  30.0 29.6  MCHC 30.4  --  31.1 30.8  RDW 14.3  --  14.5 14.8    Recent Labs  Lab 11/16/23 1002 11/16/23 1004 11/16/23 1153 11/17/23 0255 11/18/23 0242 11/19/23 0320  NA  --  141 139 140 143 137  K  --  3.8 3.1* 4.7 4.1 3.7  CL  --  100  --  99 101 95*  CO2  --  28  --  29 32 28  GLUCOSE  --  282*  --  189* 135* 265*  BUN  --  14  --  19 25* 27*  CREATININE  --  1.31*  --  1.36* 1.36* 1.41*  CALCIUM   --  9.3  --  10.0 10.3 9.6  AST  --  30  --   --   --  25  ALT  --  24  --   --   --  20  ALKPHOS  --  53  --   --   --  50  BILITOT  --  0.3  --   --   --  0.4  ALBUMIN  --  3.5  --   --   --  3.3*  MG 2.3  --   --   --   --  1.9  BNP 151.4*  --   --   --   --   --     ------------------------------------------------------------------------------------------------------------------ No results for input(s): CHOL, HDL, LDLCALC, TRIG, CHOLHDL, LDLDIRECT in the last 72 hours.  Lab Results  Component Value Date   HGBA1C 6.1 (H) 07/14/2022   ------------------------------------------------------------------------------------------------------------------ No results for input(s): TSH, T4TOTAL, T3FREE, THYROIDAB in the last 72 hours.  Invalid input(s): FREET3  Cardiac Enzymes No results for input(s): CKMB, TROPONINI, MYOGLOBIN in the last 168 hours.  Invalid input(s): CK ------------------------------------------------------------------------------------------------------------------    Component Value Date/Time   BNP 151.4 (H) 11/16/2023 1002    CBG: No results for input(s): GLUCAP in the last 168 hours.  Recent Results (from the past 240 hours)  Resp panel by RT-PCR (RSV, Flu A&B, Covid) Anterior Nasal Swab     Status: None   Collection Time: 11/16/23 10:00 AM   Specimen: Anterior Nasal Swab   Result Value Ref Range Status   SARS Coronavirus 2 by RT PCR NEGATIVE NEGATIVE Final   Influenza A by PCR NEGATIVE NEGATIVE Final   Influenza B by PCR NEGATIVE NEGATIVE Final    Comment: (NOTE) The Xpert Xpress SARS-CoV-2/FLU/RSV plus assay is intended as an aid in the diagnosis of influenza from Nasopharyngeal swab specimens and should not be used as a sole basis for treatment. Nasal washings and aspirates are unacceptable for Xpert Xpress SARS-CoV-2/FLU/RSV testing.  Fact Sheet for Patients: BloggerCourse.com  Fact Sheet for Healthcare Providers: SeriousBroker.it  This test is not yet approved or cleared by the [place]  FDA and has been authorized for detection and/or diagnosis of SARS-CoV-2 by FDA under an Emergency Use Authorization (EUA). This EUA will remain in effect (meaning this test can be used) for the duration of the COVID-19 declaration under Section 564(b)(1) of the Act, 21 U.S.C. section 360bbb-3(b)(1), unless the authorization is terminated or revoked.     Resp Syncytial Virus by PCR NEGATIVE NEGATIVE Final    Comment: (NOTE) Fact Sheet for Patients: BloggerCourse.com  Fact Sheet for Healthcare Providers: SeriousBroker.it  This test is not yet approved or cleared by the [place]  FDA and has been authorized for detection and/or diagnosis of SARS-CoV-2 by FDA under an Emergency Use Authorization (EUA). This EUA will remain in effect (meaning this test can be used) for the duration of the COVID-19 declaration under Section 564(b)(1) of the Act, 21 U.S.C. section 360bbb-3(b)(1), unless the authorization is terminated or revoked.  Performed at [facility], [address], [place], [place] [address]      Radiology Studies: No results found.   Nilda Fendt, MD, PhD [facility]  Between 7 am - 7 pm I am available, please contact me  via Amion (for emergencies) or Securechat (non urgent messages)  Between 7 pm - 7 am I am not available, please contact night coverage MD/APP via  Amion

## 2023-11-19 NOTE — Plan of Care (Signed)
  Problem: Clinical Measurements: Goal: Will remain free from infection Outcome: Progressing Goal: Respiratory complications will improve Outcome: Progressing Goal: Cardiovascular complication will be avoided Outcome: Progressing   Problem: Elimination: Goal: Will not experience complications related to bowel motility Outcome: Progressing Goal: Will not experience complications related to urinary retention Outcome: Progressing   Problem: Safety: Goal: Ability to remain free from injury will improve Outcome: Progressing

## 2023-11-20 ENCOUNTER — Other Ambulatory Visit (HOSPITAL_COMMUNITY): Payer: Self-pay

## 2023-11-20 ENCOUNTER — Other Ambulatory Visit: Payer: Self-pay

## 2023-11-20 DIAGNOSIS — J441 Chronic obstructive pulmonary disease with (acute) exacerbation: Secondary | ICD-10-CM | POA: Diagnosis not present

## 2023-11-20 MED ORDER — PREDNISONE 10 MG PO TABS
ORAL_TABLET | ORAL | 0 refills | Status: DC
  Filled 2023-11-20: qty 120, 78d supply, fill #0

## 2023-11-20 MED ORDER — AZITHROMYCIN 500 MG PO TABS
500.0000 mg | ORAL_TABLET | Freq: Every day | ORAL | 0 refills | Status: AC
  Filled 2023-11-20: qty 2, 2d supply, fill #0

## 2023-11-20 MED ORDER — ARFORMOTEROL TARTRATE 15 MCG/2ML IN NEBU
15.0000 ug | INHALATION_SOLUTION | Freq: Two times a day (BID) | RESPIRATORY_TRACT | 0 refills | Status: AC
  Filled 2023-11-20: qty 120, 30d supply, fill #0
  Filled 2024-01-30 – 2024-02-03 (×2): qty 120, 30d supply, fill #1

## 2023-11-20 MED ORDER — BUDESONIDE 0.25 MG/2ML IN SUSP
0.2500 mg | Freq: Two times a day (BID) | RESPIRATORY_TRACT | 0 refills | Status: DC
  Filled 2023-11-20 (×2): qty 120, 30d supply, fill #0

## 2023-11-20 MED ORDER — BUDESONIDE 0.25 MG/2ML IN SUSP
0.2500 mg | Freq: Two times a day (BID) | RESPIRATORY_TRACT | 0 refills | Status: AC
  Filled 2023-11-20: qty 120, 30d supply, fill #0
  Filled 2024-01-30 – 2024-02-03 (×2): qty 120, 30d supply, fill #1

## 2023-11-20 MED ORDER — ARFORMOTEROL TARTRATE 15 MCG/2ML IN NEBU
15.0000 ug | INHALATION_SOLUTION | Freq: Two times a day (BID) | RESPIRATORY_TRACT | 0 refills | Status: DC
  Filled 2023-11-20 (×2): qty 120, 30d supply, fill #0

## 2023-11-20 MED ORDER — PREDNISONE 10 MG PO TABS
10.0000 mg | ORAL_TABLET | Freq: Every day | ORAL | 0 refills | Status: DC
  Filled 2023-11-20: qty 30, 30d supply, fill #0

## 2023-11-20 NOTE — TOC Transition Note (Signed)
 Transition of Care [facility]) - Discharge Note   Patient Details  Name: RAYLIE MADDISON MRN: [id number] Date of Birth: 11-27-1955  [facility] CM/SW Contact:  Waddell Barnie Rama, RN Phone Number: 11/20/2023, 10:03 AM   Clinical Narrative:    For dc today, NCM informed Kasie with  [facility] . Spouse at the bedside will transport home.   Final next level of care: Home w Hospice Care Barriers to Discharge: Continued Medical Work up   Patient Goals and CMS Choice Patient states their goals for this hospitalization and ongoing recovery are:: from home with spouse   Choice offered to / list presented to : NA      Discharge Placement                       Discharge Plan and Services Additional resources added to the After Visit Summary for     Discharge Planning Services: CM Consult Post Acute Care Choice: Hospice            DME Agency: NA       HH Arranged: NA          Social Drivers of Health (SDOH) Interventions SDOH Screenings   Food Insecurity: No Food Insecurity (11/16/2023)  Housing: Low Risk  (11/16/2023)  Transportation Needs: No Transportation Needs (11/16/2023)  Utilities: Not At Risk (11/16/2023)  Alcohol  Screen: Low Risk  (03/22/2020)  Depression (PHQ2-9): Medium Risk (09/10/2021)  Financial Resource Strain: High Risk (09/10/2021)  Physical Activity: Inactive (03/25/2021)  Social Connections: Socially Isolated (11/16/2023)  Stress: No Stress Concern Present (09/10/2021)  Tobacco Use: Medium Risk (11/16/2023)     Readmission Risk Interventions    11/17/2023   11:05 AM 05/17/2023   12:49 PM 09/20/2021    3:27 PM  Readmission Risk Prevention Plan  Transportation Screening Complete Complete Complete  PCP or Specialist Appt within 3-5 Days Complete Complete   HRI or Home Care Consult Complete Complete   Social Work Consult for Recovery Care Planning/Counseling  Complete   Palliative Care Screening Not Applicable Not  Applicable   Medication Review Oceanographer) Complete Complete Complete  PCP or Specialist appointment within 3-5 days of discharge   Complete  HRI or Home Care Consult   Complete  SW Recovery Care/Counseling Consult   Complete  Palliative Care Screening   Not Applicable  Skilled Nursing Facility   Not Applicable

## 2023-11-20 NOTE — Progress Notes (Signed)
   11/20/23 0347  BiPAP/CPAP/SIPAP  Reason BIPAP/CPAP not in use Other(comment) (STBY, pt found off at this time.)    Pt resting comfortably off bipap at this time. SPO2 96% on 6L nasal cannula.

## 2023-11-20 NOTE — Discharge Instructions (Signed)
 If you feel like you are becoming more short of breath, please do a nebulizer treatment with DuoNebs, also repeat a nebulizer treatment with Pulmicort .  You can also try to take 40 mg of prednisone .

## 2023-11-20 NOTE — Care Management Important Message (Signed)
 Important Message  Patient Details  Name: Kim [name] MRN: [id number] Date of Birth: 03/25/1956   Important Message Given:  Yes - Medicare IM     Vonzell Arrie Sharps 11/20/2023, 10:55 AM

## 2023-11-20 NOTE — Discharge Summary (Addendum)
 Physician Discharge Summary  Kim [name] FMW:[id number] DOB: [birth year] DOA: 11/16/2023  PCP: Merna Huxley, NP  Admit date: 11/16/2023 Discharge date: 11/20/2023  Admitted From: home with hospice Disposition:  home with hospice  Recommendations for Outpatient Follow-up:  Follow up with PCP in 1-2 weeks Please readmit to hospice  Home Health: none Equipment/Devices: none  Discharge Condition: stable CODE STATUS: DNR Diet Orders (From admission, onward)     Start     Ordered   11/16/23 1508  Diet Heart Room service appropriate? Yes; Fluid consistency: Thin  Diet effective now       Question Answer Comment  Room service appropriate? 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  She has noticed increasing wheezing.  Chest x-ray was negative for infiltrates, cardiomegaly or hyperinflation.  She was admitted with COPD exacerbation  Hospital Course / Discharge diagnoses: Active Problems:   COPD exacerbation (HCC)   Chronic diastolic CHF (congestive heart failure) (HCC)   Essential hypertension   Chronic kidney disease, stage 3b (HCC)   Hyperlipidemia   Anxiety and depression   Sleep apnea   Principal problem Acute on chronic hypoxic respiratory failure in the setting of underlying COPD exacerbation -patient was admitted to the hospital with COPD exacerbation, has had recurrent hospitalizations for these.  She is with home with hospice, but wants to return to the hospital every time she gets short of breath.  She was treated with steroids, azithromycin , nebulizers with improvement in her respiratory status.  She will be discharged home in stable condition on a prednisone  taper, few more days of azithromycin .  Benefits were checked for her to be on maintenance therapy, and will  be started on Brovana  and Pulmicort  nebulizers since insurance does not cover Trelegy or Breztri   Active problems HTN, chronic diastolic CHF-euvolemic, continue home medications  CKD 3B-creatinine at baseline Hyperlipidemia-continue statin Anxiety/depression-continue home medications OSA-nightly BiPAP  Sepsis ruled out   Discharge Instructions   Allergies as of 11/20/2023       Reactions   Tape Other (See Comments)   Skin tears easily, peels skin off   Daliresp  [roflumilast ] Other (See Comments)   Skin peeling   Penicillins Hives   Cozaar  [losartan ] Other (See Comments)   Possible cause of severe hypotension   Dupixent  [dupilumab ] Diarrhea, Swelling, Other (See Comments), Cough   Facial swelling Headache   Latex Other (See Comments)   Peels skin off   Omnicef  [cefdinir ] Rash   Statins Other (See Comments)   Myalgia   Torsemide  Other (See Comments)   Severe stomach cramps and all over   Trazodone  And Nefazodone Other (See Comments)   Patient stated the last 2 times she took this medication gave her very bad migraine so she cut them out completely.   Zestril  [lisinopril ] Cough        Medication List     TAKE these medications    albuterol  108 (90 Base) MCG/ACT inhaler Commonly known as: Ventolin  HFA INHALE 2 PUFFS INTO THE LUNGS EVERY 4 TO 6 HOURS AS NEEDED FOR SHORTNESS OF BREATH OR WHEEZING What changed:  how much to take how to take this when to take this reasons to take this additional instructions   ALPRAZolam  1 MG tablet  Commonly known as: XANAX  TAKE 1 TABLET(1 MG) BY MOUTH THREE TIMES DAILY What changed: See the new instructions.   arformoterol  15 MCG/2ML Nebu Commonly known as: BROVANA  Take 2 mLs (15 mcg total) by nebulization 2 (two) times daily.   aspirin  EC 81 MG tablet Take 1 tablet (81 mg total) by mouth daily. What changed: when to take this   azithromycin  500 MG tablet Commonly known as: ZITHROMAX  Take 1 tablet (500 mg total) by  mouth daily for 2 days.   budesonide  0.25 MG/2ML nebulizer solution Commonly known as: PULMICORT  Take 2 mLs (0.25 mg total) by nebulization 2 (two) times daily.   butalbital -acetaminophen -caffeine  50-325-40 MG tablet Commonly known as: FIORICET  Take 1 tablet by mouth every 4 (four) hours as needed for headache. TAKE 1 TABLET BY MOUTH EVERY 4 HOURS AS NEEDED FOR HEADACHETAKE 1 TABLET BY MOUTH EVERY 4 HOURS AS NEEDED FOR HEADACHE What changed: additional instructions   cetirizine 10 MG tablet Commonly known as: ZYRTEC Take 10 mg by mouth in the morning.   furosemide  40 MG tablet Commonly known as: LASIX  Take 1 tablet (40 mg total) by mouth at bedtime.   HYDROcodone -acetaminophen  10-325 MG tablet Commonly known as: NORCO Take 1 tablet by mouth every 6 (six) hours as needed for moderate pain (pain score 4-6) or severe pain (pain score 7-10).   hydrocortisone  cream 1 % Apply 1 Application topically 2 (two) times daily.   ipratropium-albuterol  0.5-2.5 (3) MG/3ML Soln Commonly known as: DUONEB Take 3 mLs by nebulization every 4 (four) hours as needed. What changed: when to take this   meclizine  25 MG tablet Commonly known as: ANTIVERT  Take 25 mg by mouth every 8 (eight) hours as needed.   morphine  CONCENTRATE 10 mg / 0.5 ml concentrated solution Take 0.5 mLs by mouth as needed for severe pain (pain score 7-10).   Mucinex  Maximum Strength 1200 MG Tb12 Generic drug: Guaifenesin  Take 1,200 mg by mouth every 12 (twelve) hours.   nitroGLYCERIN  0.4 MG SL tablet Commonly known as: NITROSTAT  Place 1 tablet (0.4 mg total) under the tongue every 5 (five) minutes as needed for chest pain.   nystatin  100000 UNIT/ML suspension Commonly known as: MYCOSTATIN  Take 5 mLs (500,000 Units total) by mouth 4 (four) times daily. What changed:  when to take this reasons to take this   OXYGEN Inhale 2.5-3.5 L/min into the lungs continuous.   PARoxetine  40 MG tablet Commonly known as:  PAXIL  Take 0.5 tablets (20 mg total) by mouth in the morning. TAKE 1 TABLET(40 MG) BY MOUTH EVERY MORNING What changed:  how much to take additional instructions   predniSONE  10 MG tablet Commonly known as: DELTASONE  Take 1 tablet (10 mg total) by mouth daily. What changed: You were already taking a medication with the same name, and this prescription was added. Make sure you understand how and when to take each.   predniSONE  10 MG tablet Commonly known as: DELTASONE  Take 1 tablet (10 mg total) by mouth daily. What changed: You were already taking a medication with the same name, and this prescription was added. Make sure you understand how and when to take each.   predniSONE  10 MG tablet Commonly known as: DELTASONE  4 tabs daily for 7 days then 3 tabs daily for 7 days then 2 tabs daily for 7 days then 1 tab daily What changed:  how much to take how to take this when to take this additional instructions   rosuvastatin  20 MG tablet Commonly known as:  CRESTOR  Take 1 tablet (20 mg total) by mouth daily. What changed: when to take this   Senokot 8.6 MG tablet Generic drug: senna Take 1 tablet by mouth 2 (two) times daily as needed for constipation.   Vitamin B-12 5000 MCG Subl Place 5,000 mcg under the tongue at bedtime.   Vitamin D3 125 MCG (5000 UT) Caps Take 5,000 Units by mouth at bedtime.        Follow-up Information     [facility] Follow up.   Why: home Hospice Contact information: [address] [place] [address] [phone]                 Consultations: none  Procedures/Studies:  DG Chest Portable 1 View Result Date: 11/16/2023 CLINICAL DATA:  SOB EXAM: PORTABLE CHEST - 1 VIEW COMPARISON:  Aug 06, 2023 FINDINGS: No focal airspace consolidation, pleural effusion, or pneumothorax. No cardiomegaly. No acute fracture or destructive lesions. Multilevel thoracic osteophytosis. IMPRESSION: No acute cardiopulmonary  abnormality. Electronically Signed   By: Rogelia Myers M.D.   On: 11/16/2023 11:31     Subjective: - no chest pain, shortness of breath, no abdominal pain, nausea or vomiting.   Discharge Exam: BP 119/72 (BP Location: Right Arm)   Pulse 81   Temp 97.8 F (36.6 C) (Oral)   Resp 18   Ht 5' 1 (1.549 m)   Wt 66.9 kg   SpO2 96%   BMI 27.87 kg/m   General: Pt is alert, awake, not in acute distress Cardiovascular: RRR, S1/S2 +, no rubs, no gallops Respiratory: CTA bilaterally, no wheezing, no rhonchi Abdominal: Soft, NT, ND, bowel sounds + Extremities: no edema, no cyanosis    The results of significant diagnostics from this hospitalization (including imaging, microbiology, ancillary and laboratory) are listed below for reference.     Microbiology: Recent Results (from the past 240 hours)  Resp panel by RT-PCR (RSV, Flu A&B, Covid) Anterior Nasal Swab     Status: None   Collection Time: 11/16/23 10:00 AM   Specimen: Anterior Nasal Swab  Result Value Ref Range Status   SARS Coronavirus 2 by RT PCR NEGATIVE NEGATIVE Final   Influenza A by PCR NEGATIVE NEGATIVE Final   Influenza B by PCR NEGATIVE NEGATIVE Final    Comment: (NOTE) The Xpert Xpress SARS-CoV-2/FLU/RSV plus assay is intended as an aid in the diagnosis of influenza from Nasopharyngeal swab specimens and should not be used as a sole basis for treatment. Nasal washings and aspirates are unacceptable for Xpert Xpress SARS-CoV-2/FLU/RSV testing.  Fact Sheet for Patients: BloggerCourse.com  Fact Sheet for Healthcare Providers: SeriousBroker.it  This test is not yet approved or cleared by the [place]  FDA and has been authorized for detection and/or diagnosis of SARS-CoV-2 by FDA under an Emergency Use Authorization (EUA). This EUA will remain in effect (meaning this test can be used) for the duration of the COVID-19 declaration under Section 564(b)(1) of the  Act, 21 U.S.C. section 360bbb-3(b)(1), unless the authorization is terminated or revoked.     Resp Syncytial Virus by PCR NEGATIVE NEGATIVE Final    Comment: (NOTE) Fact Sheet for Patients: BloggerCourse.com  Fact Sheet for Healthcare Providers: SeriousBroker.it  This test is not yet approved or cleared by the [place]  FDA and has been authorized for detection and/or diagnosis of SARS-CoV-2 by FDA under an Emergency Use Authorization (EUA). This EUA will remain in effect (meaning this test can be used) for the duration of the COVID-19 declaration  under Section 564(b)(1) of the Act, 21 U.S.C. section 360bbb-3(b)(1), unless the authorization is terminated or revoked.  Performed at [facility], [address] [place], [place] [address]      Labs: Basic Metabolic Panel: Recent Labs  Lab 11/16/23 1002 11/16/23 1004 11/16/23 1153 11/17/23 0255 11/18/23 0242 11/19/23 0320  NA  --  141 139 140 143 137  K  --  3.8 3.1* 4.7 4.1 3.7  CL  --  100  --  99 101 95*  CO2  --  28  --  29 32 28  GLUCOSE  --  282*  --  189* 135* 265*  BUN  --  14  --  19 25* 27*  CREATININE  --  1.31*  --  1.36* 1.36* 1.41*  CALCIUM   --  9.3  --  10.0 10.3 9.6  MG 2.3  --   --   --   --  1.9  PHOS  --   --   --   --   --  2.8   Liver Function Tests: Recent Labs  Lab 11/16/23 1004 11/19/23 0320  AST 30 25  ALT 24 20  ALKPHOS 53 50  BILITOT 0.3 0.4  PROT 6.9 5.7*  ALBUMIN 3.5 3.3*   CBC: Recent Labs  Lab 11/16/23 1002 11/16/23 1153 11/17/23 0255 11/19/23 0320  WBC 16.7*  --  13.2* 13.9*  HGB 12.6 12.2 11.9* 11.6*  HCT 41.4 36.0 38.3 37.7  MCV 97.6  --  96.5 96.2  PLT 348  --  315 321   CBG: No results for input(s): GLUCAP in the last 168 hours. Hgb A1c No results for input(s): HGBA1C in the last 72 hours. Lipid Profile No results for input(s): CHOL, HDL, LDLCALC, TRIG, CHOLHDL, LDLDIRECT in the last  72 hours. Thyroid  function studies No results for input(s): TSH, T4TOTAL, T3FREE, THYROIDAB in the last 72 hours.  Invalid input(s): FREET3 Urinalysis    Component Value Date/Time   COLORURINE YELLOW 08/07/2023 0648   APPEARANCEUR CLEAR 08/07/2023 0648   LABSPEC 1.011 08/07/2023 0648   PHURINE 5.0 08/07/2023 0648   GLUCOSEU NEGATIVE 08/07/2023 0648   HGBUR MODERATE (A) 08/07/2023 0648   HGBUR trace-lysed 02/26/2010 0951   BILIRUBINUR NEGATIVE 08/07/2023 0648   BILIRUBINUR negative 04/29/2021 1243   KETONESUR NEGATIVE 08/07/2023 0648   PROTEINUR NEGATIVE 08/07/2023 0648   UROBILINOGEN 0.2 04/29/2021 1243   UROBILINOGEN 0.2 02/26/2010 0951   NITRITE NEGATIVE 08/07/2023 0648   LEUKOCYTESUR SMALL (A) 08/07/2023 0648    FURTHER DISCHARGE INSTRUCTIONS:   Get Medicines reviewed and adjusted: Please take all your medications with you for your next visit with your Primary MD   Laboratory/radiological data: Please request your Primary MD to go over all hospital tests and procedure/radiological results at the follow up, please ask your Primary MD to get all Hospital records sent to his/her office.   In some cases, they will be blood work, cultures and biopsy results pending at the time of your discharge. Please request that your primary care M.D. goes through all the records of your hospital data and follows up on these results.   Also Note the following: If you experience worsening of your admission symptoms, develop shortness of breath, life threatening emergency, suicidal or homicidal thoughts you must seek medical attention immediately by calling 911 or calling your MD immediately  if symptoms less severe.   You must read complete instructions/literature along with all the possible adverse reactions/side effects for all  the Medicines you take and that have been prescribed to you. Take any new Medicines after you have completely understood and accpet all the possible adverse  reactions/side effects.    Do not drive when taking Pain medications or sleeping medications (Benzodaizepines)   Do not take more than prescribed Pain, Sleep and Anxiety Medications. It is not advisable to combine anxiety,sleep and pain medications without talking with your primary care practitioner   Special Instructions: If you have smoked or chewed Tobacco  in the last 2 yrs please stop smoking, stop any regular Alcohol   and or any Recreational drug use.   Wear Seat belts while driving.   Please note: You were cared for by a hospitalist during your hospital stay. Once you are discharged, your primary care physician will handle any further medical issues. Please note that NO REFILLS for any discharge medications will be authorized once you are discharged, as it is imperative that you return to your primary care physician (or establish a relationship with a primary care physician if you do not have one) for your post hospital discharge needs so that they can reassess your need for medications and monitor your lab values.  Time coordinating discharge: 35 minutes  SIGNED:  Nilda Fendt, MD, PhD 11/20/2023, 8:58 AM

## 2023-11-22 ENCOUNTER — Other Ambulatory Visit: Payer: Self-pay | Admitting: Adult Health

## 2023-11-22 DIAGNOSIS — G8929 Other chronic pain: Secondary | ICD-10-CM

## 2023-11-23 ENCOUNTER — Other Ambulatory Visit: Payer: Self-pay | Admitting: Adult Health

## 2023-11-23 DIAGNOSIS — G8929 Other chronic pain: Secondary | ICD-10-CM

## 2023-11-24 ENCOUNTER — Other Ambulatory Visit: Payer: Self-pay | Admitting: Adult Health

## 2023-11-24 DIAGNOSIS — R519 Other chronic pain: Secondary | ICD-10-CM

## 2023-11-25 NOTE — Telephone Encounter (Signed)
 Okay for refill?

## 2024-01-06 NOTE — Progress Notes (Signed)
 This encounter was created in error - please disregard.

## 2024-01-23 ENCOUNTER — Other Ambulatory Visit: Payer: Self-pay | Admitting: Nurse Practitioner

## 2024-01-25 ENCOUNTER — Emergency Department (HOSPITAL_COMMUNITY)

## 2024-01-25 ENCOUNTER — Inpatient Hospital Stay (HOSPITAL_COMMUNITY)
Admission: EM | Admit: 2024-01-25 | Discharge: 2024-01-29 | DRG: 189 | Disposition: A | Discharge status: Hospice | Attending: Family Medicine | Admitting: Family Medicine

## 2024-01-25 ENCOUNTER — Other Ambulatory Visit: Payer: Self-pay

## 2024-01-25 DIAGNOSIS — D649 Anemia, unspecified: Secondary | ICD-10-CM

## 2024-01-25 DIAGNOSIS — D631 Anemia in chronic kidney disease: Secondary | ICD-10-CM | POA: Diagnosis present

## 2024-01-25 DIAGNOSIS — G4733 Obstructive sleep apnea (adult) (pediatric): Secondary | ICD-10-CM | POA: Diagnosis present

## 2024-01-25 DIAGNOSIS — F32A Depression, unspecified: Secondary | ICD-10-CM | POA: Diagnosis present

## 2024-01-25 DIAGNOSIS — D72829 Elevated white blood cell count, unspecified: Secondary | ICD-10-CM

## 2024-01-25 DIAGNOSIS — Z888 Allergy status to other drugs, medicaments and biological substances status: Secondary | ICD-10-CM | POA: Diagnosis not present

## 2024-01-25 DIAGNOSIS — J9622 Acute and chronic respiratory failure with hypercapnia: Secondary | ICD-10-CM | POA: Diagnosis present

## 2024-01-25 DIAGNOSIS — Z8262 Family history of osteoporosis: Secondary | ICD-10-CM

## 2024-01-25 DIAGNOSIS — Z1152 Encounter for screening for COVID-19: Secondary | ICD-10-CM | POA: Diagnosis not present

## 2024-01-25 DIAGNOSIS — J9601 Acute respiratory failure with hypoxia: Secondary | ICD-10-CM

## 2024-01-25 DIAGNOSIS — I340 Nonrheumatic mitral (valve) insufficiency: Secondary | ICD-10-CM | POA: Diagnosis present

## 2024-01-25 DIAGNOSIS — Z8349 Family history of other endocrine, nutritional and metabolic diseases: Secondary | ICD-10-CM

## 2024-01-25 DIAGNOSIS — E66811 Obesity, class 1: Secondary | ICD-10-CM | POA: Diagnosis present

## 2024-01-25 DIAGNOSIS — Z7951 Long term (current) use of inhaled steroids: Secondary | ICD-10-CM

## 2024-01-25 DIAGNOSIS — J441 Chronic obstructive pulmonary disease with (acute) exacerbation: Principal | ICD-10-CM | POA: Diagnosis present

## 2024-01-25 DIAGNOSIS — N1832 Chronic kidney disease, stage 3b: Secondary | ICD-10-CM | POA: Diagnosis present

## 2024-01-25 DIAGNOSIS — I5042 Chronic combined systolic (congestive) and diastolic (congestive) heart failure: Secondary | ICD-10-CM | POA: Diagnosis present

## 2024-01-25 DIAGNOSIS — Z881 Allergy status to other antibiotic agents status: Secondary | ICD-10-CM

## 2024-01-25 DIAGNOSIS — Z808 Family history of malignant neoplasm of other organs or systems: Secondary | ICD-10-CM

## 2024-01-25 DIAGNOSIS — Z6830 Body mass index (BMI) 30.0-30.9, adult: Secondary | ICD-10-CM

## 2024-01-25 DIAGNOSIS — Z91040 Latex allergy status: Secondary | ICD-10-CM | POA: Diagnosis not present

## 2024-01-25 DIAGNOSIS — I10 Essential (primary) hypertension: Secondary | ICD-10-CM

## 2024-01-25 DIAGNOSIS — Z7982 Long term (current) use of aspirin: Secondary | ICD-10-CM

## 2024-01-25 DIAGNOSIS — J9621 Acute and chronic respiratory failure with hypoxia: Principal | ICD-10-CM | POA: Diagnosis present

## 2024-01-25 DIAGNOSIS — D84821 Immunodeficiency due to drugs: Secondary | ICD-10-CM | POA: Diagnosis present

## 2024-01-25 DIAGNOSIS — F419 Anxiety disorder, unspecified: Secondary | ICD-10-CM | POA: Diagnosis present

## 2024-01-25 DIAGNOSIS — R739 Hyperglycemia, unspecified: Secondary | ICD-10-CM | POA: Diagnosis present

## 2024-01-25 DIAGNOSIS — Z9049 Acquired absence of other specified parts of digestive tract: Secondary | ICD-10-CM

## 2024-01-25 DIAGNOSIS — T380X5A Adverse effect of glucocorticoids and synthetic analogues, initial encounter: Secondary | ICD-10-CM | POA: Diagnosis present

## 2024-01-25 DIAGNOSIS — Z9981 Dependence on supplemental oxygen: Secondary | ICD-10-CM

## 2024-01-25 DIAGNOSIS — M1990 Unspecified osteoarthritis, unspecified site: Secondary | ICD-10-CM | POA: Diagnosis present

## 2024-01-25 DIAGNOSIS — Z7952 Long term (current) use of systemic steroids: Secondary | ICD-10-CM

## 2024-01-25 DIAGNOSIS — Z90710 Acquired absence of both cervix and uterus: Secondary | ICD-10-CM

## 2024-01-25 DIAGNOSIS — Z880 Allergy status to penicillin: Secondary | ICD-10-CM

## 2024-01-25 DIAGNOSIS — E785 Hyperlipidemia, unspecified: Secondary | ICD-10-CM | POA: Diagnosis present

## 2024-01-25 DIAGNOSIS — Z820 Family history of epilepsy and other diseases of the nervous system: Secondary | ICD-10-CM

## 2024-01-25 DIAGNOSIS — Z66 Do not resuscitate: Secondary | ICD-10-CM | POA: Diagnosis present

## 2024-01-25 DIAGNOSIS — Z87891 Personal history of nicotine dependence: Secondary | ICD-10-CM

## 2024-01-25 DIAGNOSIS — Z87442 Personal history of urinary calculi: Secondary | ICD-10-CM

## 2024-01-25 DIAGNOSIS — Z79899 Other long term (current) drug therapy: Secondary | ICD-10-CM

## 2024-01-25 DIAGNOSIS — Z8249 Family history of ischemic heart disease and other diseases of the circulatory system: Secondary | ICD-10-CM

## 2024-01-25 DIAGNOSIS — I5032 Chronic diastolic (congestive) heart failure: Secondary | ICD-10-CM

## 2024-01-25 DIAGNOSIS — I130 Hypertensive heart and chronic kidney disease with heart failure and stage 1 through stage 4 chronic kidney disease, or unspecified chronic kidney disease: Secondary | ICD-10-CM | POA: Diagnosis present

## 2024-01-25 DIAGNOSIS — Z833 Family history of diabetes mellitus: Secondary | ICD-10-CM

## 2024-01-25 DIAGNOSIS — Z823 Family history of stroke: Secondary | ICD-10-CM | POA: Diagnosis not present

## 2024-01-25 DIAGNOSIS — Z8261 Family history of arthritis: Secondary | ICD-10-CM

## 2024-01-25 LAB — COMPREHENSIVE METABOLIC PANEL WITH GFR
ALT: 26 U/L (ref 0–44)
AST: 33 U/L (ref 15–41)
Albumin: 3.2 g/dL — ABNORMAL LOW (ref 3.5–5.0)
Alkaline Phosphatase: 82 U/L (ref 38–126)
Anion gap: 15 (ref 5–15)
BUN: 22 mg/dL (ref 8–23)
CO2: 27 mmol/L (ref 22–32)
Calcium: 9.6 mg/dL (ref 8.9–10.3)
Chloride: 102 mmol/L (ref 98–111)
Creatinine, Ser: 1.47 mg/dL — ABNORMAL HIGH (ref 0.44–1.00)
GFR, Estimated: 38 mL/min — ABNORMAL LOW (ref >60)
Glucose, Bld: 258 mg/dL — ABNORMAL HIGH (ref 70–99)
Potassium: 4.4 mmol/L (ref 3.5–5.1)
Sodium: 144 mmol/L (ref 135–145)
Total Bilirubin: 0.5 mg/dL (ref 0.0–1.2)
Total Protein: 5.9 g/dL — ABNORMAL LOW (ref 6.5–8.1)

## 2024-01-25 LAB — CBC WITH DIFFERENTIAL/PLATELET
Basophils Absolute: 0.2 K/uL — ABNORMAL HIGH (ref 0.0–0.1)
Basophils Relative: 1 %
Eosinophils Absolute: 0.5 K/uL (ref 0.0–0.5)
Eosinophils Relative: 2 %
HCT: 39.4 % (ref 36.0–46.0)
Hemoglobin: 11.8 g/dL — ABNORMAL LOW (ref 12.0–15.0)
Lymphocytes Relative: 45 %
Lymphs Abs: 10.4 K/uL — ABNORMAL HIGH (ref 0.7–4.0)
MCH: 29.4 pg (ref 26.0–34.0)
MCHC: 29.9 g/dL — ABNORMAL LOW (ref 30.0–36.0)
MCV: 98.3 fL (ref 80.0–100.0)
Monocytes Absolute: 1.2 K/uL — ABNORMAL HIGH (ref 0.1–1.0)
Monocytes Relative: 5 %
Neutro Abs: 10.9 K/uL — ABNORMAL HIGH (ref 1.7–7.7)
Neutrophils Relative %: 47 %
Platelets: 341 K/uL (ref 150–400)
RBC: 4.01 MIL/uL (ref 3.87–5.11)
RDW: 15.6 % — ABNORMAL HIGH (ref 11.5–15.5)
WBC: 23.1 K/uL — ABNORMAL HIGH (ref 4.0–10.5)
nRBC: 0 % (ref 0.0–0.2)

## 2024-01-25 LAB — I-STAT VENOUS BLOOD GAS, ED
Acid-Base Excess: 2 mmol/L (ref 0.0–2.0)
Bicarbonate: 30.4 mmol/L — ABNORMAL HIGH (ref 20.0–28.0)
Calcium, Ion: 1.24 mmol/L (ref 1.15–1.40)
HCT: 36 % (ref 36.0–46.0)
Hemoglobin: 12.2 g/dL (ref 12.0–15.0)
O2 Saturation: 96 %
Potassium: 4.3 mmol/L (ref 3.5–5.1)
Sodium: 142 mmol/L (ref 135–145)
TCO2: 32 mmol/L (ref 22–32)
pCO2, Ven: 64 mmHg — ABNORMAL HIGH (ref 44–60)
pH, Ven: 7.285 (ref 7.25–7.43)
pO2, Ven: 96 mmHg — ABNORMAL HIGH (ref 32–45)

## 2024-01-25 LAB — RESP PANEL BY RT-PCR (RSV, FLU A&B, COVID)  RVPGX2
Influenza A by PCR: NEGATIVE
Influenza B by PCR: NEGATIVE
Resp Syncytial Virus by PCR: NEGATIVE
SARS Coronavirus 2 by RT PCR: NEGATIVE

## 2024-01-25 LAB — HEMOGLOBIN A1C
Hgb A1c MFr Bld: 6.7 % — ABNORMAL HIGH (ref 4.8–5.6)
Mean Plasma Glucose: 145.59 mg/dL

## 2024-01-25 LAB — PROCALCITONIN: Procalcitonin: <0.1 ng/mL

## 2024-01-25 MED ORDER — GUAIFENESIN ER 600 MG PO TB12
600.0000 mg | ORAL_TABLET | Freq: Two times a day (BID) | ORAL | Status: DC
  Administered 2024-01-25 – 2024-01-29 (×8): 600 mg via ORAL
  Filled 2024-01-25 (×8): qty 1

## 2024-01-25 MED ORDER — FUROSEMIDE 40 MG PO TABS
40.0000 mg | ORAL_TABLET | Freq: Every day | ORAL | Status: DC
Start: 2024-01-25 — End: 2024-01-29
  Administered 2024-01-25 – 2024-01-28 (×4): 40 mg via ORAL
  Filled 2024-01-25 (×3): qty 1
  Filled 2024-01-25: qty 2

## 2024-01-25 MED ORDER — ACETAMINOPHEN 650 MG RE SUPP: 650.0000 mg | Freq: Four times a day (QID) | RECTAL | Status: DC | PRN

## 2024-01-25 MED ORDER — ALPRAZOLAM 0.5 MG PO TABS
1.0000 mg | ORAL_TABLET | Freq: Two times a day (BID) | ORAL | Status: DC | PRN
  Administered 2024-01-25 – 2024-01-27 (×4): 1 mg via ORAL
  Filled 2024-01-25 (×3): qty 2
  Filled 2024-01-25: qty 4

## 2024-01-25 MED ORDER — KETOROLAC TROMETHAMINE 15 MG/ML IJ SOLN
15.0000 mg | Freq: Once | INTRAMUSCULAR | Status: AC
  Administered 2024-01-25: 15 mg via INTRAVENOUS
  Filled 2024-01-25: qty 1

## 2024-01-25 MED ORDER — SODIUM CHLORIDE 0.9 % IV SOLN
2.0000 g | INTRAVENOUS | Status: DC
  Administered 2024-01-26: 2 g via INTRAVENOUS
  Filled 2024-01-25: qty 20

## 2024-01-25 MED ORDER — REVEFENACIN 175 MCG/3ML IN SOLN
175.0000 ug | Freq: Every day | RESPIRATORY_TRACT | Status: DC
  Administered 2024-01-26 – 2024-01-28 (×3): 175 ug via RESPIRATORY_TRACT
  Filled 2024-01-25 (×4): qty 3

## 2024-01-25 MED ORDER — METHYLPREDNISOLONE SODIUM SUCC 40 MG IJ SOLR
40.0000 mg | INTRAMUSCULAR | Status: DC
  Administered 2024-01-25: 40 mg via INTRAVENOUS
  Filled 2024-01-25 (×2): qty 1

## 2024-01-25 MED ORDER — MORPHINE SULFATE (CONCENTRATE) 10 MG /0.5 ML PO SOLN
10.0000 mg | ORAL | Status: DC | PRN
Start: 2024-01-25 — End: 2024-01-29
  Administered 2024-01-26 – 2024-01-28 (×3): 10 mg via ORAL
  Filled 2024-01-25 (×3): qty 0.5

## 2024-01-25 MED ORDER — PAROXETINE HCL 20 MG PO TABS
40.0000 mg | ORAL_TABLET | Freq: Every morning | ORAL | Status: DC
  Administered 2024-01-26 – 2024-01-29 (×4): 40 mg via ORAL
  Filled 2024-01-25 (×5): qty 2

## 2024-01-25 MED ORDER — ROSUVASTATIN CALCIUM 20 MG PO TABS
20.0000 mg | ORAL_TABLET | Freq: Every day | ORAL | Status: DC
  Administered 2024-01-25 – 2024-01-28 (×4): 20 mg via ORAL
  Filled 2024-01-25 (×4): qty 1

## 2024-01-25 MED ORDER — ENOXAPARIN SODIUM 40 MG/0.4ML IJ SOSY
40.0000 mg | PREFILLED_SYRINGE | INTRAMUSCULAR | Status: DC
  Filled 2024-01-25 (×5): qty 0.4

## 2024-01-25 MED ORDER — METHYLPREDNISOLONE SODIUM SUCC 125 MG IJ SOLR: 80.0000 mg | INTRAMUSCULAR | Status: DC

## 2024-01-25 MED ORDER — KETOROLAC TROMETHAMINE 15 MG/ML IJ SOLN
15.0000 mg | INTRAMUSCULAR | Status: AC
  Administered 2024-01-25: 15 mg via INTRAVENOUS
  Filled 2024-01-25: qty 1

## 2024-01-25 MED ORDER — SODIUM CHLORIDE 0.9 % IV SOLN
500.0000 mg | Freq: Once | INTRAVENOUS | Status: AC
  Administered 2024-01-25: 500 mg via INTRAVENOUS
  Filled 2024-01-25: qty 5

## 2024-01-25 MED ORDER — ACETAMINOPHEN 325 MG PO TABS
650.0000 mg | ORAL_TABLET | Freq: Four times a day (QID) | ORAL | Status: DC | PRN
  Administered 2024-01-25: 650 mg via ORAL
  Filled 2024-01-25 (×2): qty 2

## 2024-01-25 MED ORDER — ONDANSETRON HCL 4 MG/2ML IJ SOLN
4.0000 mg | Freq: Once | INTRAMUSCULAR | Status: AC | PRN
  Administered 2024-01-25: 4 mg via INTRAVENOUS
  Filled 2024-01-25: qty 2

## 2024-01-25 MED ORDER — ARFORMOTEROL TARTRATE 15 MCG/2ML IN NEBU
15.0000 ug | INHALATION_SOLUTION | Freq: Two times a day (BID) | RESPIRATORY_TRACT | Status: DC
Start: 2024-01-25 — End: 2024-01-29
  Administered 2024-01-25 – 2024-01-28 (×7): 15 ug via RESPIRATORY_TRACT
  Filled 2024-01-25 (×8): qty 2

## 2024-01-25 MED ORDER — SODIUM CHLORIDE 0.9% FLUSH
3.0000 mL | Freq: Two times a day (BID) | INTRAVENOUS | Status: DC
  Administered 2024-01-25 – 2024-01-29 (×8): 3 mL via INTRAVENOUS

## 2024-01-25 MED ORDER — IPRATROPIUM-ALBUTEROL 0.5-2.5 (3) MG/3ML IN SOLN
3.0000 mL | Freq: Once | RESPIRATORY_TRACT | Status: AC
  Administered 2024-01-25: 3 mL via RESPIRATORY_TRACT
  Filled 2024-01-25: qty 3

## 2024-01-25 MED ORDER — BUDESONIDE 0.5 MG/2ML IN SUSP
0.5000 mg | Freq: Two times a day (BID) | RESPIRATORY_TRACT | Status: DC
Start: 2024-01-25 — End: 2024-01-29
  Administered 2024-01-25 – 2024-01-28 (×7): 0.5 mg via RESPIRATORY_TRACT
  Filled 2024-01-25 (×8): qty 2

## 2024-01-25 MED ORDER — SODIUM CHLORIDE 0.9 % IV SOLN
2.0000 g | Freq: Once | INTRAVENOUS | Status: AC
  Administered 2024-01-25: 2 g via INTRAVENOUS
  Filled 2024-01-25: qty 20

## 2024-01-25 MED ORDER — ALBUTEROL SULFATE (2.5 MG/3ML) 0.083% IN NEBU: 2.5000 mg | INHALATION_SOLUTION | RESPIRATORY_TRACT | Status: DC | PRN

## 2024-01-25 MED ORDER — ALBUTEROL SULFATE (2.5 MG/3ML) 0.083% IN NEBU
5.0000 mg/h | INHALATION_SOLUTION | Freq: Once | RESPIRATORY_TRACT | Status: AC
  Administered 2024-01-25: 5 mg/h via RESPIRATORY_TRACT
  Filled 2024-01-25: qty 6

## 2024-01-25 MED ORDER — IPRATROPIUM-ALBUTEROL 0.5-2.5 (3) MG/3ML IN SOLN
3.0000 mL | Freq: Four times a day (QID) | RESPIRATORY_TRACT | Status: DC
  Administered 2024-01-25 (×2): 3 mL via RESPIRATORY_TRACT
  Filled 2024-01-25 (×2): qty 3

## 2024-01-25 NOTE — H&P (Addendum)
 History and Physical    Patient: Kim [name] FMW:[id number] DOB: 03-08-1956 DOA: 01/25/2024 DOS: the patient was seen and examined on 01/25/2024 PCP: Merna Huxley, NP  Patient coming from: Home with hospice via EMS  Chief Complaint:  Chief Complaint  Patient presents with   Respiratory Distress   HPI: Kim [name] is a 68 y.o. female with medical history significant of steroid-dependent COPD, chronic respiratory failure, chronic combined systolic/diastolic CHF, Takotsubo cardiomyopathy, anxiety, and CKD 3B presents with shortness of breath and cough.  History is somewhat limited as patient is currently on BiPAP.  She began experiencing shortness of breath early yesterday morning, which has persisted. No fevers or chills. She has a cough and uses breathing treatments approximately every six hours, including three inhalers, one of which is Ipratropium, used five times a day. She is uncertain of her effectiveness.  Her chest feels sore due to breathing but is not similar to heart attack pain. She recently completed a prednisone  taper last Friday, which involved taking six tablets for three days, then five, four, three, and two tablets each for three days. She typically takes ten tablets a day.  Patient was noted to be hypoxic with O2 saturations down into the 80s and systolic blood pressures in the 90s.  On EMS arrival. Solu-Medrol  125 mg IV, Solu-Medrol  2 g IV, epinephrine  0.3 mg IM, and was placed on CPAP with EMS.  Labs significant for WBC 23.1, BUN 22, creatinine 1.47.  Venous blood gas noted pH was 7.285, pCO2  64, and pO2 96.  Chest x-ray noted small blunting of the right costophrenic angle concerning for small right pleural effusion.  Patient was given a breathing treatment.    Review of Systems: As mentioned in the history of present illness. All other systems reviewed and are negative. 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   Chronic kidney  disease, stage 3a (HCC) 08/26/2021   COPD 08/31/2007   DEPRESSION 11/19/2006   DYSLIPIDEMIA 03/12/2009   MI (mitral incompetence)    NEPHROLITHIASIS 01/05/2008   OSTEOARTHRITIS 06/05/2009   PARESTHESIA 08/24/2007   TOBACCO ABUSE 01/25/2010   Past Surgical History:  Procedure Laterality Date   ABDOMINAL HYSTERECTOMY     APPENDECTOMY  1969   CARDIAC CATHETERIZATION N/A 07/05/2015   Procedure: Left Heart Cath and Coronary Angiography;  Surgeon: Alm LELON Clay, MD;  Location: [facility] INVASIVE CV LAB;  Service: Cardiovascular;  Laterality: N/A;   CHOLECYSTECTOMY  1989   collapse lung  1990   COLONOSCOPY WITH PROPOFOL  N/A 05/14/2018   Procedure: COLONOSCOPY WITH PROPOFOL ;  Surgeon: Abran Norleen SAILOR, MD;  Location: [facility];  Service: Endoscopy;  Laterality: N/A;   EXCISION MASS ABDOMINAL  2019   EXPLORATORY LAPAROTOMY  1969   LEFT HEART CATH AND CORONARY ANGIOGRAPHY N/A 11/19/2020   Procedure: LEFT HEART CATH AND CORONARY ANGIOGRAPHY;  Surgeon: Mady Bruckner, MD;  Location: [facility] INVASIVE CV LAB;  Service: Cardiovascular;  Laterality: N/A;   POLYPECTOMY  05/14/2018   Procedure: POLYPECTOMY;  Surgeon: Abran Norleen SAILOR, MD;  Location: [facility];  Service: Endoscopy;;   SALPINGOOPHORECTOMY  2019   Social History:  reports that she quit smoking about 9 years ago. Her smoking use included cigarettes. She started smoking about 47 years ago. She has a 76 pack-year smoking history. She has never used smokeless tobacco. She reports that she does not drink alcohol  and does not use drugs.  Allergies  Allergen Reactions   Tape Other (See Comments)  Skin tears easily, peels skin off   Daliresp  [Roflumilast ] Other (See Comments)    Skin peeling   Penicillins Hives   Cozaar  [Losartan ] Other (See Comments)    Possible cause of severe hypotension   Dupixent  [Dupilumab ] Diarrhea, Swelling, Other (See Comments) and Cough    Facial swelling Headache   Latex Other (See Comments)    Peels skin off   Omnicef   [Cefdinir ] Rash   Statins Other (See Comments)    Myalgia    Torsemide  Other (See Comments)    Severe stomach cramps and all over   Trazodone  And Nefazodone Other (See Comments)    Patient stated the last 2 times she took this medication gave her very bad migraine so she cut them out completely.   Zestril  [Lisinopril ] Cough    Family History  Problem Relation Age of Onset   Cancer Mother        lung   Heart attack Mother    Hypertension Sister    Multiple sclerosis Brother    Diabetes Sister    Rheum arthritis Sister    Thyroid  disease Sister    Multiple sclerosis Other    Alcohol  abuse Other    Arthritis Other    Diabetes Other    Kidney disease Other    Cancer Other        lung,ovarian,skin, uterine   Stroke Other    Heart disease Other    Melanoma Other    Osteoporosis Other    Heart attack Maternal Uncle    Heart attack Other        NEICE   Heart attack Other        NEPHEW   Hypertension Brother    Stroke Maternal Grandmother    Colon cancer Neg Hx     Prior to Admission medications   Medication Sig Start Date End Date Taking? Authorizing Provider  albuterol  (VENTOLIN  HFA) 108 (90 Base) MCG/ACT inhaler INHALE 2 PUFFS INTO THE LUNGS EVERY 4 TO 6 HOURS AS NEEDED FOR SHORTNESS OF BREATH OR WHEEZING Patient taking differently: Inhale 2 puffs into the lungs every 3 (three) hours as needed for wheezing or shortness of breath. 02/02/23   Jude Harden GAILS, MD  ALPRAZolam  (XANAX ) 1 MG tablet TAKE 1 TABLET(1 MG) BY MOUTH THREE TIMES DAILY Patient taking differently: Take 1 mg by mouth 2 (two) times daily as needed for anxiety or sleep. 06/18/23   Nafziger, Darleene, NP  arformoterol  (BROVANA ) 15 MCG/2ML NEBU Use 2 mLs (15 mcg total) by nebulization 2 (two) times daily. 11/20/23 02/18/24  Trixie Nilda HERO, MD  aspirin  EC 81 MG EC tablet Take 1 tablet (81 mg total) by mouth daily. Patient taking differently: Take 81 mg by mouth every Monday, Wednesday, and Friday. 07/08/15   Samtani,  Jai-Gurmukh, MD  budesonide  (PULMICORT ) 0.25 MG/2ML nebulizer solution Take 2 mLs (0.25 mg total) by nebulization 2 (two) times daily. 11/20/23 02/18/24  Gherghe, Costin M, MD  butalbital -acetaminophen -caffeine  (FIORICET ) 50-325-40 MG tablet TAKE 1 TABLET BY MOUTH EVERY 4 HOURS AS NEEDED FOR HEADACHE 11/25/23   Nafziger, Darleene, NP  cetirizine (ZYRTEC) 10 MG tablet Take 10 mg by mouth in the morning.    [provider]  Cholecalciferol  (VITAMIN D3) 125 MCG (5000 UT) CAPS Take 5,000 Units by mouth at bedtime.    [provider]  Cyanocobalamin  (VITAMIN B-12) 5000 MCG SUBL Place 5,000 mcg under the tongue at bedtime.    [provider]  furosemide  (LASIX ) 40 MG tablet Take 1  tablet (40 mg total) by mouth at bedtime. 08/14/23   Ghimire, Donalda HERO, MD  HYDROcodone -acetaminophen  (NORCO) 10-325 MG tablet Take 1 tablet by mouth every 6 (six) hours as needed for moderate pain (pain score 4-6) or severe pain (pain score 7-10). 11/09/23   [provider]  hydrocortisone  cream 1 % Apply 1 Application topically 2 (two) times daily. 07/15/23   [provider]  ipratropium-albuterol  (DUONEB) 0.5-2.5 (3) MG/3ML SOLN Take 3 mLs by nebulization every 4 (four) hours as needed. Patient taking differently: Take 3 mLs by nebulization in the morning, at noon, and at bedtime. 10/15/22 11/16/23  Amin, Ankit C, MD  meclizine  (ANTIVERT ) 25 MG tablet Take 25 mg by mouth every 8 (eight) hours as needed. 11/09/23   [provider]  Morphine  Sulfate (MORPHINE  CONCENTRATE) 10 mg / 0.5 ml concentrated solution Take 0.5 mLs by mouth as needed for severe pain (pain score 7-10).    [provider]  MUCINEX  MAXIMUM STRENGTH 1200 MG TB12 Take 1,200 mg by mouth every 12 (twelve) hours.    [provider]  nitroGLYCERIN  (NITROSTAT ) 0.4 MG SL tablet Place 1 tablet (0.4 mg total) under the tongue every 5 (five) minutes as needed for chest pain. 10/24/20   Verlin Lonni BIRCH, MD   nystatin  (MYCOSTATIN ) 100000 UNIT/ML suspension Take 5 mLs (500,000 Units total) by mouth 4 (four) times daily. Patient taking differently: Take 5 mLs by mouth 4 (four) times daily as needed (for thrush). 11/04/22   Nafziger, Darleene, NP  OXYGEN Inhale 2.5-3.5 L/min into the lungs continuous.    [provider]  PARoxetine  (PAXIL ) 40 MG tablet Take 0.5 tablets (20 mg total) by mouth in the morning. TAKE 1 TABLET(40 MG) BY MOUTH EVERY MORNING Patient taking differently: Take 40 mg by mouth in the morning. 04/30/23   Nafziger, Darleene, NP  predniSONE  (DELTASONE ) 10 MG tablet Take 1 tablet (10 mg total) by mouth daily. 11/20/23   Gherghe, Costin M, MD  predniSONE  (DELTASONE ) 10 MG tablet Take 1 tablet (10 mg total) by mouth daily. 11/20/23   Gherghe, Costin M, MD  predniSONE  (DELTASONE ) 10 MG tablet Take 4 tablets by mouth daily for 7 days THEN take 3 tablets daily for 7 days THEN take 2 tablets daily for 7 days THEN take 1 tablet daily 11/20/23   Gherghe, Costin M, MD  rosuvastatin  (CRESTOR ) 20 MG tablet Take 1 tablet (20 mg total) by mouth daily. Patient taking differently: Take 20 mg by mouth at bedtime. 08/05/23   Verlin Lonni BIRCH, MD  SENOKOT 8.6 MG tablet Take 1 tablet by mouth 2 (two) times daily as needed for constipation.    [provider]    Physical Exam: Vitals:   01/25/24 1050 01/25/24 1051 01/25/24 1053 01/25/24 1310  BP:  (!) 145/91    Pulse:  95    Resp:  (!) 21    Temp:  98.1 F (36.7 C)    TempSrc:  Oral    SpO2: 100% 100%  100%  Weight:   67 kg   Height:   5' 1 (1.549 m)    Exam  Constitutional: Elderly female currently in no acute distress  eyes: PERRL, lids and conjunctivae normal ENMT: Mucous membranes are moist. Posterior pharynx clear of any exudate or lesions.Normal dentition.  Neck: normal, supple  Respiratory: Decreased overall aeration with expiratory wheezes appreciated.  Patient on BiPAP able to talk in short sentences at this  time. Cardiovascular: Regular rate and rhythm, no murmurs /  rubs / gallops. No extremity edema. 2+ pedal pulses. N Abdomen: no tenderness, no masses palpated. No hepatosplenomegaly. Bowel sounds positive.  Musculoskeletal: no clubbing / cyanosis. No joint deformity upper and lower extremities. Good ROM, no contractures. Normal muscle tone.  Skin: no rashes, lesions, ulcers. No induration Neurologic: CN 2-12 grossly intact. Strength 5/5 in all 4.  Psychiatric: Normal judgment and insight. Alert and oriented x 3. Normal mood.   Data Reviewed:  EKG reveals sinus tachycardia 107 bpm.  Reviewed labs, imaging, and pertinent records as documented. Assessment and Plan:  Acute on chronic respiratory failure with hypoxia and hypercapnia COPD exacerbation Patient presents with worsening shortness of breath and cough.  Last hospitalized back in August for same.  On physical exam patient with expiratory wheezes appreciated both lung fields.  Chest x-ray with slight blunting of the right costophrenic angle concerning for small pleural effusion.  Patient was placed on BiPAP in the ED. Venous blood gas revealed pCO2 64.  Patient had been given Solu-Medrol  125 mg IV, magnesium  sulfate 2 g IV, epinephrine  0.3 mg IM, and DuoNeb breathing treatments. - Admit to progressive bed - Continuous pulse oximetry with oxygen maintain O2 saturation greater than 90%. - BiPAP.  Wean off when able - Check procalcitonin - DuoNebs 4 times daily and albuterol  nebs as needed - Brovana  and budesonide  nebs twice daily - Solu-Medrol  40 mg IV twice daily.  Transition to oral prednisone  once deemed medically appropriate - Continue Rocephin  - Mucinex  once able  Leukocytosis Acute on chronic.  White blood cell count elevated at 23.1.  Patient had been started on empiric antibiotics.  Suspect possibly reactive in nature. - Recheck CBC tomorrow morning  Diastolic CHF Patient appears to be fairly euvolemic at this time.  Last  echocardiogram noted EF to be 50 to 55% with indeterminate diastolic parameters when checked 08/07/2023. -Strict I&Os and daily weights - Continue furosemide   Hyperglycemia Acute.  Glucose noted to be elevated up to 258.  Last available hemoglobin A1c noted to be 6.1 back in 07/2022.  Thought likely secondary to acute stress and steroids - Add-on Hemoglobin A1c - Consider adding sliding scale of insulin  if blood sugars remain elevated repeat blood work in a.m.  Hypertension Blood pressures currently maintained. - Continue current regimen  Normocytic anemia Hemoglobin noted to be 11.8 which appears to priors.  No reports of bleeding - Continue to monitor  Chronic kidney disease stage IIIb Creatinine noted to be 1.47 which appears slightly higher than most recent checks. - Continue to monitor  Anxiety and depression - Continue Paxil  and Xanax  as needed  Hyperlipidemia  - Continue Crestor   OSA - Continue BiPAP nightly after able to be weaned off   DVT prophylaxis: Lovenox  Advance Care Planning:   Code Status: Limited: Do not attempt resuscitation (DNR) -DNR-LIMITED -Do Not Intubate/DNI     Consults: none  Severity of Illness: The appropriate patient status for this patient is INPATIENT. Inpatient status is judged to be reasonable and necessary in order to provide the required intensity of service to ensure the patient's safety. The patient's presenting symptoms, physical exam findings, and initial radiographic and laboratory data in the context of their chronic comorbidities is felt to place them at high risk for further clinical deterioration. Furthermore, it is not anticipated that the patient will be medically stable for discharge from the hospital within 2 midnights of admission.   * I certify that at the point of admission it is my clinical judgment that the patient  will require inpatient hospital care spanning beyond 2 midnights from the point of admission due to high intensity  of service, high risk for further deterioration and high frequency of surveillance required.*  Author: Maximino DELENA Sharps, MD 01/25/2024 2:05 PM  For on call review [email or web address].

## 2024-01-25 NOTE — ED Notes (Signed)
 RT at [facility].

## 2024-01-25 NOTE — ED Provider Notes (Signed)
 [place] EMERGENCY DEPARTMENT AT [facility] Provider Note  MDM   HPI/ROS:  Kim [name] is a 68 y.o. female with a PMH of COPD on home hospice, CKD 3B, HTN, HLD, OSA on nightly and intermittent during the day BiPAP comes to the hospital with shortness of breath.  Patient is BIBEMS and history is obtained primarily from EMS as well as patient's.  She has had a cough that has been progressive over the last several days, increased shortness of breath and work of breathing today despite using BiPAP last night.  With EMS, patient's O2 saturations in the 80s, RR in the 40s, initially hypotensive with systolics in the 90s.  He EMS gave 125 Solu-Medrol , magnesium , 0.3 mg IM epi and 2 DuoNebs and placed on CPAP with saturations increased to the mid 90s.   DDx includes but is not limited to COPD exacerbation, CHF exacerbation, ACS, PE, pneumonia, pneumothorax  On my initial evaluation, patient is:  -Vital signs stable on CPAP, patient is afebrile, and ill-appearing.  And non-toxic appearing. Physical exam is notable for: - Poor air movement bilaterally, mild end expiratory wheeze with significant increase work of breathing, tripoding, accessory muscle use -- Alert and oriented x 4, GCS of 15, patient able to speak in broken sentences.  On arrival, confirmed with patient her portable DNI DNR, patient was able to express that she would like to uphold this DNI/DNR.  Patient was placed on BiPAP, initiated on another DuoNeb without much improvement in naris, then patient initiated on continuous albuterol .  Patient has already received Solu-Medrol , magnesium , IM epi and route, therefore was given azithromycin  and Rocephin  for the OPD exacerbation and presumed CAP coverage given her symptoms and productive cough  WBC of 23.1 with neutrophil predominance, CMP with creatinine of 1.47 (patient's baseline 1.41) VBG 7.2 85/64/30.4.  Patient will compensated with hypercarbia.  CXR with evidence of right  pleural effusion. EKG: Sinus tachycardia with HR of 107, axes and intervals all WNL, presents negative deflection in leads II and III with T wave elevations  Interpretations, interventions, and the patient's course of care are documented below.    Clinical Course as of 01/25/24 1622  Mon Jan 25, 2024  1052 7.2 85/64/30 0.4.  Patient well compensated with her hypercarbia, [SA]  1537 Admitted to stepdown for COPD exacerbation on BiPAP. Previously on continuous. DNR/DNI. Tx w/ EMS [LB]    Clinical Course User Index [LB] Sharlet Dowdy, MD [SA] Billy Pal, MD     Disposition:  I discussed the case with hospitalist who graciously agreed to admit the patient to their service for continued care.   Clinical Impression:  1. COPD exacerbation (HCC)   2. Acute respiratory failure with hypoxia and hypercapnia (HCC)     Clinical Complexity A medically appropriate history, review of systems, and physical exam was performed.  My independent interpretations of EKG, labs, and radiology are documented in the ED course above.   If decision rules were used in this patient's evaluation, they are listed below.   Click here for ABCD2, HEART and other calculatorsREFRESH Note before signing   Patient's presentation is most consistent with acute presentation with potential threat to life or bodily function.  Medical Decision Making Amount and/or Complexity of Data Reviewed Labs: ordered. Radiology: ordered.  Risk Prescription drug management. Decision regarding hospitalization.    HPI/ROS      See MDM section for pertinent HPI and ROS. A complete ROS was performed with pertinent positives/negatives noted above.  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/2008   OSTEOARTHRITIS 06/05/2009    PARESTHESIA 08/24/2007   TOBACCO ABUSE 01/25/2010    Past Surgical History:  Procedure Laterality Date   ABDOMINAL HYSTERECTOMY     APPENDECTOMY  1969   CARDIAC CATHETERIZATION N/A 07/05/2015   Procedure: Left Heart Cath and Coronary Angiography;  Surgeon: Alm LELON Clay, MD;  Location: [facility] INVASIVE CV LAB;  Service: Cardiovascular;  Laterality: N/A;   CHOLECYSTECTOMY  1989   collapse lung  1990   COLONOSCOPY WITH PROPOFOL  N/A 05/14/2018   Procedure: COLONOSCOPY WITH PROPOFOL ;  Surgeon: Abran Norleen SAILOR, MD;  Location: [facility];  Service: Endoscopy;  Laterality: N/A;   EXCISION MASS ABDOMINAL  2019   EXPLORATORY LAPAROTOMY  1969   LEFT HEART CATH AND CORONARY ANGIOGRAPHY N/A 11/19/2020   Procedure: LEFT HEART CATH AND CORONARY ANGIOGRAPHY;  Surgeon: Mady Bruckner, MD;  Location: [facility] INVASIVE CV LAB;  Service: Cardiovascular;  Laterality: N/A;   POLYPECTOMY  05/14/2018   Procedure: POLYPECTOMY;  Surgeon: Abran Norleen SAILOR, MD;  Location: [facility];  Service: Endoscopy;;   SALPINGOOPHORECTOMY  2019      Physical Exam   There were no vitals filed for this visit.  Physical Exam   Procedures   If procedures were preformed on this patient, they are listed below:  Procedures   @BBSIG @   Please note that this documentation was produced with the assistance of voice-to-text technology and may contain errors.     Billy Pal, MD 01/25/24 1625    Garrick Charleston, MD 01/26/24 [phone]

## 2024-01-25 NOTE — Progress Notes (Signed)
   01/25/24 1031  Adult Ventilator Settings  Vent Type Servo i  Vent Mode BIPAP  Set Rate 18 bmp  FiO2 (%) 40 %  I Time 0.9 Sec(s)  IPAP 5 cmH20  EPAP 5 cmH20  Pressure Control 10 cmH20  PEEP 5 cmH20      Pt placed on BIPAP per MD order 5/5 on 40% and is tolerating well. VSS. RT will monitor.

## 2024-01-25 NOTE — Progress Notes (Signed)
 Pt trialed off BIPAP to 3l [place]. Pt is tolerating well at this time and VSS. RT will monitor.

## 2024-01-25 NOTE — ED Triage Notes (Signed)
 Pt brought in from home by EMS with reports of respiratory distress. EMS reports on arrival pts O2 saturations was down in to 80's, RR 40's, initially hypotensive with systolic in the 90's. PT has HX of COPD and CHF. EMS gave Solumedrol, magnesium   0.3mg  of IM epi. 3 rounds of duonebs. Pt placed on cpap, Sat increased to mid 90's, systolic increases to the 160's. 20g IV to left hand.

## 2024-01-25 NOTE — ED Notes (Signed)
 RT took pt. Off of Bi-Pap to see how she tolerates

## 2024-01-26 ENCOUNTER — Encounter (HOSPITAL_COMMUNITY): Payer: Self-pay | Admitting: Internal Medicine

## 2024-01-26 DIAGNOSIS — I5032 Chronic diastolic (congestive) heart failure: Secondary | ICD-10-CM | POA: Diagnosis not present

## 2024-01-26 DIAGNOSIS — I10 Essential (primary) hypertension: Secondary | ICD-10-CM | POA: Diagnosis not present

## 2024-01-26 DIAGNOSIS — J441 Chronic obstructive pulmonary disease with (acute) exacerbation: Secondary | ICD-10-CM | POA: Diagnosis not present

## 2024-01-26 DIAGNOSIS — J9621 Acute and chronic respiratory failure with hypoxia: Secondary | ICD-10-CM | POA: Diagnosis not present

## 2024-01-26 LAB — BASIC METABOLIC PANEL WITH GFR
Anion gap: 10 (ref 5–15)
BUN: 26 mg/dL — ABNORMAL HIGH (ref 8–23)
CO2: 29 mmol/L (ref 22–32)
Calcium: 9.2 mg/dL (ref 8.9–10.3)
Chloride: 104 mmol/L (ref 98–111)
Creatinine, Ser: 1.43 mg/dL — ABNORMAL HIGH (ref 0.44–1.00)
GFR, Estimated: 40 mL/min — ABNORMAL LOW (ref >60)
Glucose, Bld: 166 mg/dL — ABNORMAL HIGH (ref 70–99)
Potassium: 4.4 mmol/L (ref 3.5–5.1)
Sodium: 143 mmol/L (ref 135–145)

## 2024-01-26 LAB — BLOOD GAS, VENOUS
Acid-Base Excess: 5.2 mmol/L — ABNORMAL HIGH (ref 0.0–2.0)
Bicarbonate: 32.9 mmol/L — ABNORMAL HIGH (ref 20.0–28.0)
O2 Saturation: 32 %
Patient temperature: 36.7
pCO2, Ven: 60 mmHg (ref 44–60)
pH, Ven: 7.34 (ref 7.25–7.43)
pO2, Ven: <31 mmHg — CL (ref 32–45)

## 2024-01-26 LAB — URINALYSIS, ROUTINE W REFLEX MICROSCOPIC
Bacteria, UA: NONE SEEN
Bilirubin Urine: NEGATIVE
Glucose, UA: 150 mg/dL — AB
Hgb urine dipstick: NEGATIVE
Ketones, ur: NEGATIVE mg/dL
Nitrite: NEGATIVE
Protein, ur: NEGATIVE mg/dL
Specific Gravity, Urine: 1.015 (ref 1.005–1.030)
pH: 5 (ref 5.0–8.0)

## 2024-01-26 LAB — CBC
HCT: 34.7 % — ABNORMAL LOW (ref 36.0–46.0)
Hemoglobin: 10.7 g/dL — ABNORMAL LOW (ref 12.0–15.0)
MCH: 29.6 pg (ref 26.0–34.0)
MCHC: 30.8 g/dL (ref 30.0–36.0)
MCV: 96.1 fL (ref 80.0–100.0)
Platelets: 299 K/uL (ref 150–400)
RBC: 3.61 MIL/uL — ABNORMAL LOW (ref 3.87–5.11)
RDW: 15.7 % — ABNORMAL HIGH (ref 11.5–15.5)
WBC: 23.3 K/uL — ABNORMAL HIGH (ref 4.0–10.5)
nRBC: 0 % (ref 0.0–0.2)

## 2024-01-26 LAB — GLUCOSE, CAPILLARY
Glucose-Capillary: 187 mg/dL — ABNORMAL HIGH (ref 70–99)
Glucose-Capillary: 236 mg/dL — ABNORMAL HIGH (ref 70–99)

## 2024-01-26 LAB — MRSA NEXT GEN BY PCR, NASAL: MRSA by PCR Next Gen: NOT DETECTED

## 2024-01-26 MED ORDER — IPRATROPIUM-ALBUTEROL 0.5-2.5 (3) MG/3ML IN SOLN
3.0000 mL | RESPIRATORY_TRACT | Status: DC | PRN
Start: 2024-01-26 — End: 2024-01-29
  Administered 2024-01-27 – 2024-01-28 (×2): 3 mL via RESPIRATORY_TRACT
  Filled 2024-01-26 (×2): qty 3

## 2024-01-26 MED ORDER — METHYLPREDNISOLONE SODIUM SUCC 125 MG IJ SOLR
80.0000 mg | INTRAMUSCULAR | Status: DC
  Administered 2024-01-26 – 2024-01-28 (×3): 80 mg via INTRAVENOUS
  Filled 2024-01-26 (×3): qty 2

## 2024-01-26 MED ORDER — METHYLPREDNISOLONE SODIUM SUCC 125 MG IJ SOLR: 80.0000 mg | INTRAMUSCULAR | Status: DC

## 2024-01-26 MED ORDER — INSULIN ASPART 100 UNIT/ML IJ SOLN
0.0000 [IU] | Freq: Three times a day (TID) | INTRAMUSCULAR | Status: DC
  Administered 2024-01-26: 3 [IU] via SUBCUTANEOUS
  Administered 2024-01-27 – 2024-01-28 (×3): 2 [IU] via SUBCUTANEOUS
  Administered 2024-01-29: 3 [IU] via SUBCUTANEOUS

## 2024-01-26 MED ORDER — GUAIFENESIN-DM 100-10 MG/5ML PO SYRP
5.0000 mL | ORAL_SOLUTION | ORAL | Status: DC | PRN
  Administered 2024-01-26 – 2024-01-28 (×4): 5 mL via ORAL
  Filled 2024-01-26 (×4): qty 5

## 2024-01-26 MED ORDER — AZITHROMYCIN 500 MG IV SOLR
500.0000 mg | INTRAVENOUS | Status: DC
  Administered 2024-01-26 – 2024-01-29 (×4): 500 mg via INTRAVENOUS
  Filled 2024-01-26 (×4): qty 5

## 2024-01-26 NOTE — Plan of Care (Signed)
  Problem: Education: Goal: Knowledge of General Education information will improve Description: Including pain rating scale, medication(s)/side effects and non-pharmacologic comfort measures Outcome: Progressing   Problem: Health Behavior/Discharge Planning: Goal: Ability to manage health-related needs will improve Outcome: Not Progressing   Problem: Clinical Measurements: Goal: Ability to maintain clinical measurements within normal limits will improve Outcome: Not Progressing Goal: Will remain free from infection Outcome: Progressing Goal: Diagnostic test results will improve Outcome: Progressing Goal: Respiratory complications will improve Outcome: Progressing Goal: Cardiovascular complication will be avoided Outcome: Progressing   Problem: Activity: Goal: Risk for activity intolerance will decrease Outcome: Not Progressing   Problem: Nutrition: Goal: Adequate nutrition will be maintained Outcome: Progressing   Problem: Coping: Goal: Level of anxiety will decrease Outcome: Progressing   Problem: Elimination: Goal: Will not experience complications related to bowel motility Outcome: Progressing Goal: Will not experience complications related to urinary retention Outcome: Progressing   Problem: Pain Managment: Goal: General experience of comfort will improve and/or be controlled Outcome: Progressing

## 2024-01-26 NOTE — Progress Notes (Addendum)
 PROGRESS NOTE  GURTHA PICKER FMW:[id number] DOB: [birth year]   PCP: Merna Huxley, NP  Patient is from: Home.  DOA: 01/25/2024 LOS: 1  Chief complaints Chief Complaint  Patient presents with   Respiratory Distress     Brief Narrative / Interim history: 68 year old F with PMH of COPD on steroid, chronic RF on oxygen and BiPAP, combined CHF/Takotsubo cardiomyopathy, CKD-3B, anxiety and obesity presenting with acute shortness of breath, with productive cough, and admitted for acute on chronic respiratory failure due to COPD exacerbation.  Currently saturating in 80s when EMS arrived.  She was given Solu-Medrol , IV magnesium , epinephrine  and started on CPAP.  In ED, tachycardic and tachypneic.  WBC 23.1.  VBG 7.28/64/96/30.  CXR showed small blunting of right costophrenic angle concerning for small right pleural effusion.  Patient was given breathing treatments and started on BiPAP.   Subjective: Seen and examined earlier this morning.  No major events overnight or this morning.  Patient was weaned off BiPAP last night but put back on BiPAP this morning due to shortness of breath.  VBG improved.  Her main concern is about dressing for IV.  She reports bruising with Tegaderm or the dressing with use in the hospital.  Denies chest pain.   Assessment and plan: Acute on chronic respiratory failure with hypoxia and hypercapnia: Wears 2.5 to 3.5 L at baseline COPD with acute exacerbation-patient on p.o. prednisone  10 mg daily at home. Also on BiPAP -CXR suggesting small right pleural effusion.  COVID-19, flu and RSV PCR nonreactive -Increase Solu-Medrol  to 80 mg daily -Change ceftriaxone  to azithromycin . -Continue Brovana , Pulmicort .  Add Yupelri . -DuoNebs every 2 hours as needed -Continue home morphine  and Lasix   -Minimum oxygen to keep saturation above 88% regardless of home oxygen level -Wean off BiPAP as able.  Incentive spirometry, OOB, PT/OT -Mucolytics and antitussive . -Will  involve PCCM if no improvement with above intervention     Chronic combined CHF: TTE in 09/2022 with LVEF of 35 to 40%, G1DD.  Appears euvolemic on exam but has moon face likely from chronic steroid. -Continue home Lasix  40 mg daily -Strict intake and output and daily weights.  NIDDM-2 with steroid-induced hyperglycemia: A1c 6.7% (was 6.1% in 2024).  Not on meds. Lab Results  Component Value Date   HGBA1C 6.7 (H) 01/25/2024  - Start SSI while on steroid  Essential hypertension: Normotensive for most part. -Lasix  as above  CKD-3B: Stable and at baseline. -Continue monitoring  Normocytic anemia: Stable. -Monitor  Anxiety and depression -Continue Paxil  and Xanax  as needed   Hyperlipidemia  - Continue Crestor   Physical deconditioning - PT/OT  Leukocytosis: Suspect demargination from steroid. - Continue monitoring  Class I obesity Body mass index is 30.08 kg/m.          DVT prophylaxis:  enoxaparin  (LOVENOX ) injection 40 mg Start: 01/25/24 1430  Code Status: DNR Family Communication: Updated patient's friend at bedside Level of care: Progressive Status is: Inpatient Remains inpatient appropriate because: COPD exacerbation   Final disposition: To be determined   55 minutes with more than 50% spent in reviewing records, counseling patient/family and coordinating care.  Consultants:  None  Procedures: None  Microbiology summarized: COVID-19, influenza and RSV PCR nonreactive  Objective: Vitals:   01/26/24 0015 01/26/24 0045 01/26/24 0500 01/26/24 0751  BP: (!) 99/57 105/64 117/69 (!) 109/55  Pulse: 79 87 83 88  Resp: (!) 22 19 16 19   Temp:  97.6 F (36.4 C) 98.6 F (37 C) 98.1 F (36.7 C)  TempSrc:  Oral Oral Oral  SpO2: 100% 90% 99% 95%  Weight:  72.2 kg    Height:  5' 1 (1.549 m)      Examination:  GENERAL: On BiPAP. HEENT: MMM.  Vision and hearing grossly intact.  Motor phase. NECK: Supple.  No apparent JVD.  RESP:  No IWOB.  Fair  aeration bilaterally.  On BiPAP. CVS:  RRR. Heart sounds normal.  ABD/GI/GU: BS+. Abd soft, NTND.  MSK/EXT:  Moves extremities. No apparent deformity. No edema.  SKIN: Skin bruises in upper extremities bilaterally. NEURO: AA.  Oriented appropriately.  No apparent focal neuro deficit. PSYCH: Calm. Normal affect.   Sch Meds:  Scheduled Meds:  arformoterol   15 mcg Nebulization BID   budesonide  (PULMICORT ) nebulizer solution  0.5 mg Nebulization BID   enoxaparin  (LOVENOX ) injection  40 mg Subcutaneous Q24H   furosemide   40 mg Oral QHS   guaiFENesin   600 mg Oral BID   insulin  aspart  0-9 Units Subcutaneous TID WC   methylPREDNISolone  (SOLU-MEDROL ) injection  80 mg Intravenous Q24H   PARoxetine   40 mg Oral q AM   revefenacin   175 mcg Nebulization Daily   rosuvastatin   20 mg Oral QHS   sodium chloride  flush  3 mL Intravenous Q12H   Continuous Infusions:  azithromycin      PRN Meds:.acetaminophen  **OR** acetaminophen , ALPRAZolam , ipratropium-albuterol , morphine  CONCENTRATE  Antimicrobials: Anti-infectives (From admission, onward)    Start     Dose/Rate Route Frequency Ordered Stop   01/26/24 1430  azithromycin  (ZITHROMAX ) 500 mg in sodium chloride  0.9 % 250 mL IVPB        500 mg 250 mL/hr over 60 Minutes Intravenous Every 24 hours 01/26/24 1330 01/31/24 1414   01/26/24 1000  cefTRIAXone  (ROCEPHIN ) 2 g in sodium chloride  0.9 % 100 mL IVPB  Status:  Discontinued        2 g 200 mL/hr over 30 Minutes Intravenous Every 24 hours 01/25/24 2313 01/26/24 1330   01/25/24 1245  cefTRIAXone  (ROCEPHIN ) 2 g in sodium chloride  0.9 % 100 mL IVPB        2 g 200 mL/hr over 30 Minutes Intravenous  Once 01/25/24 1237 01/25/24 1441   01/25/24 1045  azithromycin  (ZITHROMAX ) 500 mg in sodium chloride  0.9 % 250 mL IVPB        500 mg 250 mL/hr over 60 Minutes Intravenous  Once 01/25/24 1035 01/25/24 1138        I have personally reviewed the following labs and images: CBC: Recent Labs  Lab  01/25/24 1037 01/25/24 1044 01/26/24 0807  WBC 23.1*  --  23.3*  NEUTROABS 10.9*  --   --   HGB 11.8* 12.2 10.7*  HCT 39.4 36.0 34.7*  MCV 98.3  --  96.1  PLT 341  --  299   BMP &GFR Recent Labs  Lab 01/25/24 1037 01/25/24 1044 01/26/24 0532  NA 144 142 143  K 4.4 4.3 4.4  CL 102  --  104  CO2 27  --  29  GLUCOSE 258*  --  166*  BUN 22  --  26*  CREATININE 1.47*  --  1.43*  CALCIUM  9.6  --  9.2   Estimated Creatinine Clearance: 33.8 mL/min (A) (by C-G formula based on SCr of 1.43 mg/dL (H)). Liver & Pancreas: Recent Labs  Lab 01/25/24 1037  AST 33  ALT 26  ALKPHOS 82  BILITOT 0.5  PROT 5.9*  ALBUMIN 3.2*   No results for input(s): LIPASE, AMYLASE in the  last 168 hours. No results for input(s): AMMONIA in the last 168 hours. Diabetic: Recent Labs    01/25/24 1037  HGBA1C 6.7*   No results for input(s): GLUCAP in the last 168 hours. Cardiac Enzymes: No results for input(s): CKTOTAL, CKMB, CKMBINDEX, TROPONINI in the last 168 hours. Recent Labs    04/23/23 1547  PROBNP 337*   Coagulation Profile: No results for input(s): INR, PROTIME in the last 168 hours. Thyroid  Function Tests: No results for input(s): TSH, T4TOTAL, FREET4, T3FREE, THYROIDAB in the last 72 hours. Lipid Profile: No results for input(s): CHOL, HDL, LDLCALC, TRIG, CHOLHDL, LDLDIRECT in the last 72 hours. Anemia Panel: No results for input(s): VITAMINB12, FOLATE, FERRITIN, TIBC, IRON, RETICCTPCT in the last 72 hours. Urine analysis:    Component Value Date/Time   COLORURINE YELLOW 01/25/2024 2347   APPEARANCEUR CLEAR 01/25/2024 2347   LABSPEC 1.015 01/25/2024 2347   PHURINE 5.0 01/25/2024 2347   GLUCOSEU 150 (A) 01/25/2024 2347   HGBUR NEGATIVE 01/25/2024 2347   HGBUR trace-lysed 02/26/2010 0951   BILIRUBINUR NEGATIVE 01/25/2024 2347   BILIRUBINUR negative 04/29/2021 1243   KETONESUR NEGATIVE 01/25/2024 2347   PROTEINUR  NEGATIVE 01/25/2024 2347   UROBILINOGEN 0.2 04/29/2021 1243   UROBILINOGEN 0.2 02/26/2010 0951   NITRITE NEGATIVE 01/25/2024 2347   LEUKOCYTESUR TRACE (A) 01/25/2024 2347   Sepsis Labs: Invalid input(s): PROCALCITONIN, LACTICIDVEN  Microbiology: Recent Results (from the past 240 hours)  Resp panel by RT-PCR (RSV, Flu A&B, Covid) Anterior Nasal Swab     Status: None   Collection Time: 01/25/24 11:05 AM   Specimen: Anterior Nasal Swab  Result Value Ref Range Status   SARS Coronavirus 2 by RT PCR NEGATIVE NEGATIVE Final   Influenza A by PCR NEGATIVE NEGATIVE Final   Influenza B by PCR NEGATIVE NEGATIVE Final    Comment: (NOTE) The Xpert Xpress SARS-CoV-2/FLU/RSV plus assay is intended as an aid in the diagnosis of influenza from Nasopharyngeal swab specimens and should not be used as a sole basis for treatment. Nasal washings and aspirates are unacceptable for Xpert Xpress SARS-CoV-2/FLU/RSV testing.  Fact Sheet for Patients: BloggerCourse.com  Fact Sheet for Healthcare Providers: SeriousBroker.it  This test is not yet approved or cleared by the [place]  FDA and has been authorized for detection and/or diagnosis of SARS-CoV-2 by FDA under an Emergency Use Authorization (EUA). This EUA will remain in effect (meaning this test can be used) for the duration of the COVID-19 declaration under Section 564(b)(1) of the Act, 21 U.S.C. section 360bbb-3(b)(1), unless the authorization is terminated or revoked.     Resp Syncytial Virus by PCR NEGATIVE NEGATIVE Final    Comment: (NOTE) Fact Sheet for Patients: BloggerCourse.com  Fact Sheet for Healthcare Providers: SeriousBroker.it  This test is not yet approved or cleared by the [place]  FDA and has been authorized for detection and/or diagnosis of SARS-CoV-2 by FDA under an Emergency Use Authorization (EUA). This EUA  will remain in effect (meaning this test can be used) for the duration of the COVID-19 declaration under Section 564(b)(1) of the Act, 21 U.S.C. section 360bbb-3(b)(1), unless the authorization is terminated or revoked.  Performed at [facility], [address] [place], [place] [address]   MRSA Next Gen by PCR, Nasal     Status: None   Collection Time: 01/26/24 12:48 AM   Specimen: Nasal Mucosa; Nasal Swab  Result Value Ref Range Status   MRSA by PCR Next Gen NOT DETECTED NOT DETECTED Final  Comment: (NOTE) The GeneXpert MRSA Assay (FDA approved for NASAL specimens only), is one component of a comprehensive MRSA colonization surveillance program. It is not intended to diagnose MRSA infection nor to guide or monitor treatment for MRSA infections. Test performance is not FDA approved in patients less than 35 years old. Performed at [facility], [address], [place], [place] [address]     Radiology Studies: No results found.    Loan Oguin T. Tasmia Blumer [facility]  If 7PM-7AM, please contact night-coverage [email or web address] 01/26/2024, 1:30 PM

## 2024-01-26 NOTE — Progress Notes (Signed)
 PT Cancellation Note  Patient Details Name: Kim [name] MRN: [id number] DOB: Mar 21, 1956   Cancelled Treatment:    Reason Eval/Treat Not Completed: PT screened, no needs identified, will sign off (Pt declining rehab at this time. Please re-consult if further needs arise.)  Dorothyann Maier, DPT, CLT  Acute Rehabilitation Services Office: [phone] (Secure chat preferred)   Dorothyann VEAR Maier 01/26/2024, 4:02 PM

## 2024-01-26 NOTE — Inpatient Diabetes Management (Signed)
 Inpatient Diabetes Program Recommendations  AACE/ADA: New Consensus Statement on Inpatient Glycemic Control (2015)  Target Ranges:  Prepandial:   less than 140 mg/dL      Peak postprandial:   less than 180 mg/dL (1-2 hours)      Critically ill patients:  140 - 180 mg/dL   Lab Results  Component Value Date   GLUCAP 83 12/05/2016   HGBA1C 6.7 (H) 01/25/2024    Review of Glycemic Control  Latest Reference Range & Units 01/25/24 10:37 01/26/24 05:32  Glucose 70 - 99 mg/dL 741 (H) 833 (H)  (H): Data is abnormally high  Diabetes history: DM2 vs steroid induced Outpatient Diabetes medications: none Current orders for Inpatient glycemic control: None  Inpatient Diabetes Program Recommendations:    Please consider CBG's AC/HS.  If consistently > 180 mg/dL, please consider:  Novolog  0-9 units TID and 0-5 at bedtime  Thank you, Kim Pinal, MSN, CDCES Diabetes Coordinator Inpatient Diabetes Program [phone] (team pager from 8a-5p)

## 2024-01-26 NOTE — Progress Notes (Signed)
 OT Cancellation Note  Patient Details Name: Kim [name] MRN: [id number] DOB: 1956/03/22   Cancelled Treatment:    Reason Eval/Treat Not Completed: Other (comment) Attempted to see pt, she reports she is on hospice at home and politely declined OT eval. Spoke with her & her husband, reports her husband assists with ADLs and OOB transfers to [facility] and recliner at home. Offered re-consult if needs arise/pt status changes. Will sign-off per pt request.   Kim Mccaul D., MSOT, OTR/L Acute Rehabilitation Services [phone] Secure Chat Preferred   Kim [name] 01/26/2024, 4:01 PM

## 2024-01-27 DIAGNOSIS — J441 Chronic obstructive pulmonary disease with (acute) exacerbation: Secondary | ICD-10-CM | POA: Diagnosis not present

## 2024-01-27 LAB — GLUCOSE, CAPILLARY
Glucose-Capillary: 174 mg/dL — ABNORMAL HIGH (ref 70–99)
Glucose-Capillary: 91 mg/dL (ref 70–99)
Glucose-Capillary: 168 mg/dL — ABNORMAL HIGH (ref 70–99)
Glucose-Capillary: 343 mg/dL — ABNORMAL HIGH (ref 70–99)

## 2024-01-27 NOTE — Progress Notes (Signed)
 PROGRESS NOTE    Kim [name]  FMW:[id number] DOB: 16-Sep-1955 DOA: 01/25/2024 PCP: Merna Huxley, NP   Brief Narrative: This 68 years old Female with PMH significant of COPD on chronic steroids, chronic RF on oxygen and BiPAP, combined CHF/Takotsubo cardiomyopathy, CKD-3b, Anxiety disorder and obesity presented in the ED with acute shortness of breath, productive cough, and admitted for acute on chronic respiratory failure due to COPD exacerbation. She was saturating in 80s when EMS arrived.  She was given Solu-Medrol , IV magnesium , epinephrine  and started on CPAP. CXR showed small blunting of right costophrenic angle concerning for small right pleural effusion.  Patient was admitted for further evaluation.  Assessment & Plan:   Active Problems:   Acute on chronic respiratory failure with hypoxia and hypercapnia (HCC)   COPD exacerbation (HCC)   Leukocytosis   Chronic diastolic CHF (congestive heart failure) (HCC)   Hyperglycemia   Essential hypertension   Normocytic anemia   Chronic kidney disease, stage 3b (HCC)   Anxiety and depression   Hyperlipidemia  Acute on chronic hypoxic and hypercapnic respiratory failure:  COPD with acute exacerbation: She uses 2.5 to 3.5 L of supplemental O2 at baseline. She also remains on prednisone  10 mg daily at home. Also on BiPAP CXR suggesting small right pleural effusion.   COVID-19, flu and RSV PCR nonreactive Continue Solu-Medrol  80 mg daily Continue azithromycin  for 5 days. Continue Brovana , Pulmicort .  Add Yupelri . Continue DuoNebs every 2 hours as needed Continue home morphine  and Lasix .  Continue supplemental oxygen to keep saturation above 88% regardless of home oxygen level. Wean off BiPAP as able.  Incentive spirometry, OOB, PT/OT Continue Mucolytics and antitussives.  Chronic combined CHF:  TTE in 09/2022 with LVEF of 35 to 40%, G1DD.   Appears euvolemic on exam but has moon face likely from chronic steroid use. Continue  home Lasix  40 mg daily. Strict intake and output and daily weights.   NIDDM-2 with steroid-induced hyperglycemia:  HbA1c 6.7% (was 6.1% in 2024).  Not on meds. Continue SSI while on steroids.   Essential hypertension:  Normotensive for most part. Continue Lasix  as above.   CKD-3B:  Stable and at baseline. Continue monitoring   Normocytic anemia: Stable. Monitor H/H.  No any obvious visible bleeding.   Anxiety and depression: Continue Paxil  and Xanax  as needed.   Hyperlipidemia: Continue Crestor .   Physical deconditioning - PT/OT   Leukocytosis:  Suspect demargination from steroids. - Continue monitoring   Class I obesity Body mass index is 30.08 kg/m. Diet and exercise discussed in detail.    DVT prophylaxis: Lovenox  Code Status: DNR Family Communication: Husband at bed side Disposition Plan:   Status is: Inpatient Remains inpatient appropriate because: Severity of illness.    Consultants:  None  Procedures:  Antimicrobials:  Anti-infectives (From admission, onward)    Start     Dose/Rate Route Frequency Ordered Stop   01/26/24 1430  azithromycin  (ZITHROMAX ) 500 mg in sodium chloride  0.9 % 250 mL IVPB        500 mg 250 mL/hr over 60 Minutes Intravenous Every 24 hours 01/26/24 1330 01/31/24 0959   01/26/24 1000  cefTRIAXone  (ROCEPHIN ) 2 g in sodium chloride  0.9 % 100 mL IVPB  Status:  Discontinued        2 g 200 mL/hr over 30 Minutes Intravenous Every 24 hours 01/25/24 2313 01/26/24 1330   01/25/24 1245  cefTRIAXone  (ROCEPHIN ) 2 g in sodium chloride  0.9 % 100 mL IVPB        2 g  200 mL/hr over 30 Minutes Intravenous  Once 01/25/24 1237 01/25/24 1441   01/25/24 1045  azithromycin  (ZITHROMAX ) 500 mg in sodium chloride  0.9 % 250 mL IVPB        500 mg 250 mL/hr over 60 Minutes Intravenous  Once 01/25/24 1035 01/25/24 1138       Subjective: Patient was seen and examined at bedside.  Overnight events noted. Patient appears much improved. She remains on  3 L of supplemental oxygen. Patient denies any shortness of breath.  Objective: Vitals:   01/26/24 1946 01/26/24 2159 01/27/24 0638 01/27/24 0733  BP: (!) 156/82 (!) 166/72 (!) 140/66 (!) 144/62  Pulse: 85 95 79 76  Resp: (!) 21 19 20 15   Temp: 99 F (37.2 C) 98.6 F (37 C) 97.9 F (36.6 C) 98 F (36.7 C)  TempSrc: Axillary Oral Oral Oral  SpO2: 100% 92% 98% 100%  Weight:      Height:        Intake/Output Summary (Last 24 hours) at 01/27/2024 1038 Last data filed at 01/27/2024 0947 Gross per 24 hour  Intake 860.95 ml  Output 950 ml  Net -89.05 ml   Filed Weights   01/25/24 1053 01/26/24 0045  Weight: 67 kg 72.2 kg    Examination:  General exam: Appears calm and comfortable, deconditioned, not in any acute distress. Respiratory system: CTA Bilaterally. Respiratory effort normal.  RR 14 Cardiovascular system: S1 & S2 heard, RRR. No JVD, murmurs, rubs, gallops or clicks.  Gastrointestinal system: Abdomen is non distended, soft and non tender. Normal bowel sounds heard. Central nervous system: Alert and oriented x 3. No focal neurological deficits. Extremities: No edema, no cyanosis, no clubbing. Skin: No rashes, lesions or ulcers Psychiatry: Judgement and insight appear normal. Mood & affect appropriate.   Data Reviewed: I have personally reviewed following labs and imaging studies  CBC: Recent Labs  Lab 01/25/24 1037 01/25/24 1044 01/26/24 0807  WBC 23.1*  --  23.3*  NEUTROABS 10.9*  --   --   HGB 11.8* 12.2 10.7*  HCT 39.4 36.0 34.7*  MCV 98.3  --  96.1  PLT 341  --  299   Basic Metabolic Panel: Recent Labs  Lab 01/25/24 1037 01/25/24 1044 01/26/24 0532  NA 144 142 143  K 4.4 4.3 4.4  CL 102  --  104  CO2 27  --  29  GLUCOSE 258*  --  166*  BUN 22  --  26*  CREATININE 1.47*  --  1.43*  CALCIUM  9.6  --  9.2   GFR: Estimated Creatinine Clearance: 33.8 mL/min (A) (by C-G formula based on SCr of 1.43 mg/dL (H)). Liver Function Tests: Recent Labs   Lab 01/25/24 1037  AST 33  ALT 26  ALKPHOS 82  BILITOT 0.5  PROT 5.9*  ALBUMIN 3.2*   No results for input(s): LIPASE, AMYLASE in the last 168 hours. No results for input(s): AMMONIA in the last 168 hours. Coagulation Profile: No results for input(s): INR, PROTIME in the last 168 hours. Cardiac Enzymes: No results for input(s): CKTOTAL, CKMB, CKMBINDEX, TROPONINI in the last 168 hours. BNP (last 3 results) Recent Labs    04/23/23 1547  PROBNP 337*   HbA1C: Recent Labs    01/25/24 1037  HGBA1C 6.7*   CBG: Recent Labs  Lab 01/26/24 1619 01/26/24 2119 01/27/24 0636  GLUCAP 187* 236* 174*   Lipid Profile: No results for input(s): CHOL, HDL, LDLCALC, TRIG, CHOLHDL, LDLDIRECT in the last 72 hours. Thyroid   Function Tests: No results for input(s): TSH, T4TOTAL, FREET4, T3FREE, THYROIDAB in the last 72 hours. Anemia Panel: No results for input(s): VITAMINB12, FOLATE, FERRITIN, TIBC, IRON, RETICCTPCT in the last 72 hours. Sepsis Labs: Recent Labs  Lab 01/25/24 1037  PROCALCITON <0.10    Recent Results (from the past 240 hours)  Resp panel by RT-PCR (RSV, Flu A&B, Covid) Anterior Nasal Swab     Status: None   Collection Time: 01/25/24 11:05 AM   Specimen: Anterior Nasal Swab  Result Value Ref Range Status   SARS Coronavirus 2 by RT PCR NEGATIVE NEGATIVE Final   Influenza A by PCR NEGATIVE NEGATIVE Final   Influenza B by PCR NEGATIVE NEGATIVE Final    Comment: (NOTE) The Xpert Xpress SARS-CoV-2/FLU/RSV plus assay is intended as an aid in the diagnosis of influenza from Nasopharyngeal swab specimens and should not be used as a sole basis for treatment. Nasal washings and aspirates are unacceptable for Xpert Xpress SARS-CoV-2/FLU/RSV testing.  Fact Sheet for Patients: BloggerCourse.com  Fact Sheet for Healthcare Providers: SeriousBroker.it  This test is not yet  approved or cleared by the [place]  FDA and has been authorized for detection and/or diagnosis of SARS-CoV-2 by FDA under an Emergency Use Authorization (EUA). This EUA will remain in effect (meaning this test can be used) for the duration of the COVID-19 declaration under Section 564(b)(1) of the Act, 21 U.S.C. section 360bbb-3(b)(1), unless the authorization is terminated or revoked.     Resp Syncytial Virus by PCR NEGATIVE NEGATIVE Final    Comment: (NOTE) Fact Sheet for Patients: BloggerCourse.com  Fact Sheet for Healthcare Providers: SeriousBroker.it  This test is not yet approved or cleared by the [place]  FDA and has been authorized for detection and/or diagnosis of SARS-CoV-2 by FDA under an Emergency Use Authorization (EUA). This EUA will remain in effect (meaning this test can be used) for the duration of the COVID-19 declaration under Section 564(b)(1) of the Act, 21 U.S.C. section 360bbb-3(b)(1), unless the authorization is terminated or revoked.  Performed at [facility], [address], [place], [place] [address]   MRSA Next Gen by PCR, Nasal     Status: None   Collection Time: 01/26/24 12:48 AM   Specimen: Nasal Mucosa; Nasal Swab  Result Value Ref Range Status   MRSA by PCR Next Gen NOT DETECTED NOT DETECTED Final    Comment: (NOTE) The GeneXpert MRSA Assay (FDA approved for NASAL specimens only), is one component of a comprehensive MRSA colonization surveillance program. It is not intended to diagnose MRSA infection nor to guide or monitor treatment for MRSA infections. Test performance is not FDA approved in patients less than 22 years old. Performed at [facility], [address] [place], [place] [address]     Radiology Studies: DG Chest Port 1 View Result Date: 01/25/2024 EXAM: 1 VIEW XRAY OF THE CHEST 01/25/2024 11:09:00 AM COMPARISON: Chest radiograph dated 11/06/2023.  CLINICAL HISTORY: cough, respiratory distress. FINDINGS: LUNGS AND PLEURA: Mild blunting of the right costophrenic angle could reflect a small right pleural effusion. No focal pulmonary opacity. No pulmonary edema. No pneumothorax. HEART AND MEDIASTINUM: No acute abnormality of the cardiac and mediastinal silhouettes. Aortic atherosclerosis. BONES AND SOFT TISSUES: No acute osseous abnormality. IMPRESSION: 1. Mild blunting of the right costophrenic angle could reflect a small right pleural effusion. Electronically signed by: Harrietta Sherry MD 01/25/2024 11:52 AM EDT RP Workstation: [id number]    Scheduled Meds:  arformoterol   15 mcg Nebulization BID  budesonide  (PULMICORT ) nebulizer solution  0.5 mg Nebulization BID   enoxaparin  (LOVENOX ) injection  40 mg Subcutaneous Q24H   furosemide   40 mg Oral QHS   guaiFENesin   600 mg Oral BID   insulin  aspart  0-9 Units Subcutaneous TID WC   methylPREDNISolone  (SOLU-MEDROL ) injection  80 mg Intravenous Q24H   PARoxetine   40 mg Oral q AM   revefenacin   175 mcg Nebulization Daily   rosuvastatin   20 mg Oral QHS   sodium chloride  flush  3 mL Intravenous Q12H   Continuous Infusions:  azithromycin  500 mg (01/27/24 0950)     LOS: 2 days    Time spent: 50 mins    Darcel Dawley, MD [facility]   If 7PM-7AM, please contact night-coverage

## 2024-01-27 NOTE — TOC Initial Note (Addendum)
 Transition of Care [facility]) - Initial/Assessment Note    Patient Details  Name: Kim [name] MRN: [id number] Date of Birth: [birth year]  [facility] CM/SW Contact:    Lauraine FORBES Saa, LCSWA Phone Number: 01/27/2024, 8:39 AM  Clinical Narrative:                  8:39 AM Per chart review, patient is from home with spouse. Patient has a PCP and insurance. Patient has SNF history with [facility]. Patient is currently active with [facility] for home hospice services. Patient has HH history with [facility]. Patient has DME (oxygen, manual wheelchair, BSC, RW, shower chair, tub bench, nebulizer machine) history with [facility], [facility], and [facility]. Patient's preferred pharmacy's are [name], [facility], and [facility] [phone] [facility]. TOC will continue to follow.  Expected Discharge Plan: Home w Hospice Care Barriers to Discharge: Continued Medical Work up   Patient Goals and CMS Choice            Expected Discharge Plan and Services   Discharge Planning Services: CM Consult Post Acute Care Choice: Hospice Living arrangements for the past 2 months: Single Family Home                                      Prior Living Arrangements/Services Living arrangements for the past 2 months: Single Family Home Lives with:: Spouse Patient language and need for interpreter reviewed:: Yes        Need for Family Participation in Patient Care: No (Comment) Care giver support system in place?: Yes (comment) Current home services: DME, Hospice Criminal Activity/Legal Involvement Pertinent to Current Situation/Hospitalization: No - Comment as needed  Activities of Daily Living   ADL Screening (condition at time of admission) Independently performs ADLs?: No Does the patient have a NEW difficulty with bathing/dressing/toileting/self-feeding that is expected to last >3 days?: No Does the patient have a NEW  difficulty with getting in/out of bed, walking, or climbing stairs that is expected to last >3 days?: No Does the patient have a NEW difficulty with communication that is expected to last >3 days?: No Is the patient deaf or have difficulty hearing?: No Does the patient have difficulty seeing, even when wearing glasses/contacts?: No Does the patient have difficulty concentrating, remembering, or making decisions?: No  Permission Sought/Granted Permission sought to share information with : Family Supports, Oceanographer granted to share information with : No (Contact information on chart)  Share Information with NAME: Myrtie Leuthold  Permission granted to share info w AGENCY: [facility]  Permission granted to share info w Relationship: Spouse  Permission granted to share info w Contact Information: [phone]  Emotional Assessment       Orientation: : Oriented to Self, Oriented to Place, Oriented to Situation, Oriented to  Time Alcohol  / Substance Use: Not Applicable Psych Involvement: No (comment)  Admission diagnosis:  COPD exacerbation (HCC) [J44.1] Acute on chronic respiratory failure with hypoxia (HCC) [J96.21] Acute respiratory failure with hypoxia and hypercapnia (HCC) [J96.01, J96.02] Patient Active Problem List   Diagnosis Date Noted   Normocytic anemia 01/25/2024   Leukocytosis 11/16/2023   Hypokalemia 11/16/2023   Chronic kidney disease, stage 3b (HCC) 11/16/2023   Acute on chronic respiratory failure with hypoxia (HCC) 08/07/2023   Urinary retention 05/12/2023   Shingles rash 05/12/2023   Chronic  pain syndrome 05/12/2023   COPD with acute exacerbation (HCC) 10/09/2022   AKI (acute kidney injury) 10/09/2022   COPD exacerbation (HCC) 09/11/2022   Acute on chronic respiratory failure with hypoxia and hypercapnia (HCC) 09/10/2022   Myalgia due to statin 06/05/2022   Medication reaction 02/12/2022   NSVT (nonsustained ventricular  tachycardia) (HCC) 10/11/2021   Hyperkalemia 09/22/2021   SOB (shortness of breath) 09/19/2021   Chronic respiratory failure with hypoxia (HCC)    DNR (do not resuscitate) 09/17/2021   Double vision 08/26/2021   Acute renal failure superimposed on stage 3b chronic kidney disease (HCC) 08/26/2021   Postconcussive syndrome 08/26/2021   Elevated troponin 08/26/2021   Subarachnoid hemorrhage (HCC) 08/26/2021   Syncope and collapse 08/26/2021   Chronic diastolic CHF (congestive heart failure) (HCC) 08/26/2021   NSTEMI (non-ST elevated myocardial infarction) (HCC) 11/17/2020   Osteoporosis 06/05/2020   Chronic HFrEF (heart failure with reduced ejection fraction) (HCC) 03/24/2020   Essential hypertension 03/24/2020   Vertebral fracture, osteoporotic (HCC) 03/05/2020   History of colonic polyps    Benign neoplasm of ascending colon    Neuritis 01/29/2017   Alpha-1-antitrypsin deficiency carrier 01/19/2017   Sleep apnea 01/14/2017   Dependent edema 01/14/2017   Orthostatic hypotension    Allergic rhinitis 08/10/2015   Acute on chronic combined systolic and diastolic CHF (congestive heart failure) (HCC)    Hyperglycemia    Coronary artery calcification seen on CAT scan 07/02/2015   Solitary pulmonary nodule 12/02/2013   Loss of weight 11/10/2013   Osteoarthritis 06/05/2009   Brachial neuritis or radiculitis 06/05/2009   Hyperlipidemia 03/12/2009   NEPHROLITHIASIS 01/05/2008   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 [place] [address] Phone: [phone] Fax: [phone]  [facility] - [facility] [address] [place] [address] Phone: [phone] Fax: [phone]     Social Drivers of Health (SDOH) Social History: SDOH Screenings   Food Insecurity: No Food Insecurity (01/26/2024)  Housing: Low Risk  (01/26/2024)  Transportation Needs: No Transportation Needs (01/26/2024)  Utilities: Not At Risk (01/26/2024)  Alcohol  Screen: Low Risk  (03/22/2020)  Depression (PHQ2-9): Medium Risk (09/10/2021)  Financial Resource Strain: High Risk (09/10/2021)  Physical Activity: Inactive (03/25/2021)  Social Connections: Moderately Isolated (01/26/2024)  Stress: No Stress Concern Present (09/10/2021)  Tobacco Use: Medium Risk (01/26/2024)   SDOH Interventions:     Readmission Risk Interventions    11/17/2023   11:05 AM 05/17/2023   12:49 PM 09/20/2021    3:27 PM  Readmission Risk Prevention Plan  Transportation Screening Complete Complete Complete  PCP or Specialist Appt within 3-5 Days Complete Complete   HRI or Home Care Consult Complete Complete   Social Work Consult for Recovery Care Planning/Counseling  Complete   Palliative Care Screening Not Applicable Not Applicable   Medication Review Oceanographer) Complete Complete Complete  PCP or Specialist appointment within 3-5 days of discharge   Complete  HRI or Home Care Consult   Complete  SW Recovery Care/Counseling Consult   Complete  Palliative Care Screening   Not Applicable  Skilled Nursing Facility   Not Applicable

## 2024-01-27 NOTE — Plan of Care (Signed)
  Problem: Education: Goal: Knowledge of General Education information will improve Description: Including pain rating scale, medication(s)/side effects and non-pharmacologic comfort measures Outcome: Progressing   Problem: Health Behavior/Discharge Planning: Goal: Ability to manage health-related needs will improve Outcome: Not Progressing   Problem: Clinical Measurements: Goal: Ability to maintain clinical measurements within normal limits will improve Outcome: Not Progressing Goal: Will remain free from infection Outcome: Progressing Goal: Diagnostic test results will improve Outcome: Progressing Goal: Respiratory complications will improve Outcome: Not Progressing Goal: Cardiovascular complication will be avoided Outcome: Progressing   Problem: Activity: Goal: Risk for activity intolerance will decrease Outcome: Not Progressing   Problem: Nutrition: Goal: Adequate nutrition will be maintained Outcome: Progressing   Problem: Coping: Goal: Level of anxiety will decrease Outcome: Not Progressing   Problem: Elimination: Goal: Will not experience complications related to bowel motility Outcome: Progressing Goal: Will not experience complications related to urinary retention Outcome: Progressing   Problem: Pain Managment: Goal: General experience of comfort will improve and/or be controlled Outcome: Progressing   Problem: Safety: Goal: Ability to remain free from injury will improve Outcome: Progressing   Problem: Skin Integrity: Goal: Risk for impaired skin integrity will decrease Outcome: Progressing   Problem: Education: Goal: Ability to describe self-care measures that may prevent or decrease complications (Diabetes Survival Skills Education) will improve Outcome: Progressing Goal: Individualized Educational Video(s) Outcome: Progressing   Problem: Coping: Goal: Ability to adjust to condition or change in health will improve Outcome: Progressing    Problem: Fluid Volume: Goal: Ability to maintain a balanced intake and output will improve Outcome: Progressing   Problem: Health Behavior/Discharge Planning: Goal: Ability to identify and utilize available resources and services will improve Outcome: Progressing Goal: Ability to manage health-related needs will improve Outcome: Progressing   Problem: Metabolic: Goal: Ability to maintain appropriate glucose levels will improve Outcome: Not Progressing   Problem: Nutritional: Goal: Maintenance of adequate nutrition will improve Outcome: Progressing Goal: Progress toward achieving an optimal weight will improve Outcome: Progressing   Problem: Skin Integrity: Goal: Risk for impaired skin integrity will decrease Outcome: Progressing   Problem: Tissue Perfusion: Goal: Adequacy of tissue perfusion will improve Outcome: Progressing

## 2024-01-28 DIAGNOSIS — J441 Chronic obstructive pulmonary disease with (acute) exacerbation: Secondary | ICD-10-CM | POA: Diagnosis not present

## 2024-01-28 LAB — CBC
HCT: 33.7 % — ABNORMAL LOW (ref 36.0–46.0)
Hemoglobin: 10.5 g/dL — ABNORMAL LOW (ref 12.0–15.0)
MCH: 29.3 pg (ref 26.0–34.0)
MCHC: 31.2 g/dL (ref 30.0–36.0)
MCV: 94.1 fL (ref 80.0–100.0)
Platelets: 323 K/uL (ref 150–400)
RBC: 3.58 MIL/uL — ABNORMAL LOW (ref 3.87–5.11)
RDW: 15.4 % (ref 11.5–15.5)
WBC: 16.7 K/uL — ABNORMAL HIGH (ref 4.0–10.5)
nRBC: 0 % (ref 0.0–0.2)

## 2024-01-28 LAB — BASIC METABOLIC PANEL WITH GFR
Anion gap: 8 (ref 5–15)
BUN: 24 mg/dL — ABNORMAL HIGH (ref 8–23)
CO2: 33 mmol/L — ABNORMAL HIGH (ref 22–32)
Calcium: 9.4 mg/dL (ref 8.9–10.3)
Chloride: 100 mmol/L (ref 98–111)
Creatinine, Ser: 1.19 mg/dL — ABNORMAL HIGH (ref 0.44–1.00)
GFR, Estimated: 49 mL/min — ABNORMAL LOW (ref >60)
Glucose, Bld: 158 mg/dL — ABNORMAL HIGH (ref 70–99)
Potassium: 4.8 mmol/L (ref 3.5–5.1)
Sodium: 141 mmol/L (ref 135–145)

## 2024-01-28 LAB — GLUCOSE, CAPILLARY
Glucose-Capillary: 159 mg/dL — ABNORMAL HIGH (ref 70–99)
Glucose-Capillary: 123 mg/dL — ABNORMAL HIGH (ref 70–99)
Glucose-Capillary: 92 mg/dL (ref 70–99)
Glucose-Capillary: 289 mg/dL — ABNORMAL HIGH (ref 70–99)

## 2024-01-28 LAB — MAGNESIUM: Magnesium: 2.1 mg/dL (ref 1.7–2.4)

## 2024-01-28 LAB — PHOSPHORUS: Phosphorus: 4.2 mg/dL (ref 2.5–4.6)

## 2024-01-28 NOTE — Progress Notes (Signed)
 PROGRESS NOTE    Kim [name]  FMW:[id number] DOB: 02/17/1956 DOA: 01/25/2024 PCP: Merna Huxley, NP   Brief Narrative: This 68 years old Female with PMH significant of COPD on chronic steroids, chronic RF on oxygen and BiPAP, combined CHF/Takotsubo cardiomyopathy, CKD-3b, Anxiety disorder and obesity presented in the ED with acute shortness of breath, productive cough, and admitted for acute on chronic respiratory failure due to COPD exacerbation. She was saturating in 80s when EMS arrived.  She was given Solu-Medrol , IV magnesium , epinephrine  and started on CPAP. CXR showed small blunting of right costophrenic angle concerning for small right pleural effusion.  Patient was admitted for further evaluation.  Assessment & Plan:   Active Problems:   Acute on chronic respiratory failure with hypoxia and hypercapnia (HCC)   COPD exacerbation (HCC)   Leukocytosis   Chronic diastolic CHF (congestive heart failure) (HCC)   Hyperglycemia   Essential hypertension   Normocytic anemia   Chronic kidney disease, stage 3b (HCC)   Anxiety and depression   Hyperlipidemia  Acute on chronic hypoxic and hypercapnic respiratory failure:  COPD with acute exacerbation: She uses 2.5 to 3.5 L of supplemental O2 at baseline. She also remains on prednisone  10 mg daily at home. Also on BiPAP CXR suggesting small right pleural effusion.   COVID-19, flu and RSV PCR nonreactive Continue Solu-Medrol  80 mg daily. Continue azithromycin  for 5 days. Continue Brovana , Pulmicort . Continue Yupelri . Continue DuoNebs every 2 hours as needed. Continue home morphine  and Lasix .  Continue supplemental oxygen to keep saturation above 88% regardless of home oxygen level. Weaned off BiPAP successfully.  Incentive spirometry, OOB, PT/OT Continue Mucolytics and antitussives.  Chronic combined CHF:  TTE in 09/2022 with LVEF of 35 to 40%, G1DD.   Appears euvolemic on exam but has moon face likely from chronic steroid  use. Continue home Lasix  40 mg daily. Strict intake and output and daily weights.   Intake/Output Summary (Last 24 hours) at 01/28/2024 1028 Last data filed at 01/28/2024 0956 Gross per 24 hour  Intake 728 ml  Output 2650 ml  Net -1922 ml      NIDDM-2 with steroid-induced hyperglycemia:  HbA1c 6.7% (was 6.1% in 2024).  Not on meds. Continue SSI while on steroids.   Essential hypertension:  Normotensive for most part. Continue Lasix  as above.   CKD-3B:  Stable and at baseline. Continue monitoring   Normocytic anemia: Stable. Monitor H/H.  No any obvious visible bleeding.   Anxiety and depression: Continue Paxil  and Xanax  as needed.   Hyperlipidemia: Continue Crestor .   Physical deconditioning - PT/OT >    Leukocytosis:  > Improving  Suspect demargination from steroids. - Continue monitoring   Class I obesity: Body mass index is 30.08 kg/m. Diet and exercise discussed in detail.    DVT prophylaxis: Lovenox  Code Status: DNR Family Communication: Husband at bed side Disposition Plan:   Status is: Inpatient Remains inpatient appropriate because: Severity of illness.  She has high chances for readmission if discharged early.  Anticipated discharge home on 01/29/2024.    Consultants:  None  Procedures:  Antimicrobials:  Anti-infectives (From admission, onward)    Start     Dose/Rate Route Frequency Ordered Stop   01/26/24 1430  azithromycin  (ZITHROMAX ) 500 mg in sodium chloride  0.9 % 250 mL IVPB        500 mg 250 mL/hr over 60 Minutes Intravenous Every 24 hours 01/26/24 1330 01/31/24 0959   01/26/24 1000  cefTRIAXone  (ROCEPHIN ) 2 g in sodium chloride  0.9 % 100  mL IVPB  Status:  Discontinued        2 g 200 mL/hr over 30 Minutes Intravenous Every 24 hours 01/25/24 2313 01/26/24 1330   01/25/24 1245  cefTRIAXone  (ROCEPHIN ) 2 g in sodium chloride  0.9 % 100 mL IVPB        2 g 200 mL/hr over 30 Minutes Intravenous  Once 01/25/24 1237 01/25/24 1441    01/25/24 1045  azithromycin  (ZITHROMAX ) 500 mg in sodium chloride  0.9 % 250 mL IVPB        500 mg 250 mL/hr over 60 Minutes Intravenous  Once 01/25/24 1035 01/25/24 1138       Subjective: Patient was seen and examined at bedside.  Overnight events noted. Patient appears much improved. She remains on 3 L of supplemental oxygen. Patient reports persistent cough,  not getting any better.  Objective: Vitals:   01/27/24 2041 01/27/24 2305 01/28/24 0400 01/28/24 0731  BP:  (!) 146/62 (!) 152/66 136/60  Pulse:  87 80 75  Resp:  19 18 19   Temp:  99.1 F (37.3 C) 97.9 F (36.6 C) 97.7 F (36.5 C)  TempSrc:  Axillary Axillary Oral  SpO2: 98% 96% 98% 96%  Weight:      Height:        Intake/Output Summary (Last 24 hours) at 01/28/2024 1028 Last data filed at 01/28/2024 0956 Gross per 24 hour  Intake 728 ml  Output 2650 ml  Net -1922 ml   Filed Weights   01/25/24 1053 01/26/24 0045  Weight: 67 kg 72.2 kg    Examination:  General exam: Appears calm and comfortable, deconditioned, not in any acute distress. Respiratory system: CTA Bilaterally. Respiratory effort normal.  RR 16 Cardiovascular system: S1 & S2 heard, RRR. No JVD, murmurs, rubs, gallops or clicks.  Gastrointestinal system: Abdomen is non distended, soft and non tender. Normal bowel sounds heard. Central nervous system: Alert and oriented x 3. No focal neurological deficits. Extremities: No edema, no cyanosis, no clubbing. Skin: No rashes, lesions or ulcers Psychiatry: Judgement and insight appear normal. Mood & affect appropriate.   Data Reviewed: I have personally reviewed following labs and imaging studies  CBC: Recent Labs  Lab 01/25/24 1037 01/25/24 1044 01/26/24 0807 01/28/24 0244  WBC 23.1*  --  23.3* 16.7*  NEUTROABS 10.9*  --   --   --   HGB 11.8* 12.2 10.7* 10.5*  HCT 39.4 36.0 34.7* 33.7*  MCV 98.3  --  96.1 94.1  PLT 341  --  299 323   Basic Metabolic Panel: Recent Labs  Lab 01/25/24 1037  01/25/24 1044 01/26/24 0532 01/28/24 0244  NA 144 142 143 141  K 4.4 4.3 4.4 4.8  CL 102  --  104 100  CO2 27  --  29 33*  GLUCOSE 258*  --  166* 158*  BUN 22  --  26* 24*  CREATININE 1.47*  --  1.43* 1.19*  CALCIUM  9.6  --  9.2 9.4  MG  --   --   --  2.1  PHOS  --   --   --  4.2   GFR: Estimated Creatinine Clearance: 40.6 mL/min (A) (by C-G formula based on SCr of 1.19 mg/dL (H)). Liver Function Tests: Recent Labs  Lab 01/25/24 1037  AST 33  ALT 26  ALKPHOS 82  BILITOT 0.5  PROT 5.9*  ALBUMIN 3.2*   No results for input(s): LIPASE, AMYLASE in the last 168 hours. No results for input(s): AMMONIA in the last  168 hours. Coagulation Profile: No results for input(s): INR, PROTIME in the last 168 hours. Cardiac Enzymes: No results for input(s): CKTOTAL, CKMB, CKMBINDEX, TROPONINI in the last 168 hours. BNP (last 3 results) Recent Labs    04/23/23 1547  PROBNP 337*   HbA1C: Recent Labs    01/25/24 1037  HGBA1C 6.7*   CBG: Recent Labs  Lab 01/27/24 0636 01/27/24 1123 01/27/24 1614 01/27/24 2111 01/28/24 0634  GLUCAP 174* 91 168* 343* 159*   Lipid Profile: No results for input(s): CHOL, HDL, LDLCALC, TRIG, CHOLHDL, LDLDIRECT in the last 72 hours. Thyroid  Function Tests: No results for input(s): TSH, T4TOTAL, FREET4, T3FREE, THYROIDAB in the last 72 hours. Anemia Panel: No results for input(s): VITAMINB12, FOLATE, FERRITIN, TIBC, IRON, RETICCTPCT in the last 72 hours. Sepsis Labs: Recent Labs  Lab 01/25/24 1037  PROCALCITON <0.10    Recent Results (from the past 240 hours)  Resp panel by RT-PCR (RSV, Flu A&B, Covid) Anterior Nasal Swab     Status: None   Collection Time: 01/25/24 11:05 AM   Specimen: Anterior Nasal Swab  Result Value Ref Range Status   SARS Coronavirus 2 by RT PCR NEGATIVE NEGATIVE Final   Influenza A by PCR NEGATIVE NEGATIVE Final   Influenza B by PCR NEGATIVE NEGATIVE Final     Comment: (NOTE) The Xpert Xpress SARS-CoV-2/FLU/RSV plus assay is intended as an aid in the diagnosis of influenza from Nasopharyngeal swab specimens and should not be used as a sole basis for treatment. Nasal washings and aspirates are unacceptable for Xpert Xpress SARS-CoV-2/FLU/RSV testing.  Fact Sheet for Patients: BloggerCourse.com  Fact Sheet for Healthcare Providers: SeriousBroker.it  This test is not yet approved or cleared by the [place]  FDA and has been authorized for detection and/or diagnosis of SARS-CoV-2 by FDA under an Emergency Use Authorization (EUA). This EUA will remain in effect (meaning this test can be used) for the duration of the COVID-19 declaration under Section 564(b)(1) of the Act, 21 U.S.C. section 360bbb-3(b)(1), unless the authorization is terminated or revoked.     Resp Syncytial Virus by PCR NEGATIVE NEGATIVE Final    Comment: (NOTE) Fact Sheet for Patients: BloggerCourse.com  Fact Sheet for Healthcare Providers: SeriousBroker.it  This test is not yet approved or cleared by the [place]  FDA and has been authorized for detection and/or diagnosis of SARS-CoV-2 by FDA under an Emergency Use Authorization (EUA). This EUA will remain in effect (meaning this test can be used) for the duration of the COVID-19 declaration under Section 564(b)(1) of the Act, 21 U.S.C. section 360bbb-3(b)(1), unless the authorization is terminated or revoked.  Performed at [facility], [address] [place], [place] [address]   MRSA Next Gen by PCR, Nasal     Status: None   Collection Time: 01/26/24 12:48 AM   Specimen: Nasal Mucosa; Nasal Swab  Result Value Ref Range Status   MRSA by PCR Next Gen NOT DETECTED NOT DETECTED Final    Comment: (NOTE) The GeneXpert MRSA Assay (FDA approved for NASAL specimens only), is one component of a comprehensive  MRSA colonization surveillance program. It is not intended to diagnose MRSA infection nor to guide or monitor treatment for MRSA infections. Test performance is not FDA approved in patients less than 75 years old. Performed at [facility], [address] [place], [place] [address]     Radiology Studies: No results found.   Scheduled Meds:  arformoterol   15 mcg Nebulization BID   budesonide  (PULMICORT )  nebulizer solution  0.5 mg Nebulization BID   enoxaparin  (LOVENOX ) injection  40 mg Subcutaneous Q24H   furosemide   40 mg Oral QHS   guaiFENesin   600 mg Oral BID   insulin  aspart  0-9 Units Subcutaneous TID WC   methylPREDNISolone  (SOLU-MEDROL ) injection  80 mg Intravenous Q24H   PARoxetine   40 mg Oral q AM   revefenacin   175 mcg Nebulization Daily   rosuvastatin   20 mg Oral QHS   sodium chloride  flush  3 mL Intravenous Q12H   Continuous Infusions:  azithromycin  500 mg (01/28/24 0955)     LOS: 3 days    Time spent: 35 mins    Darcel Dawley, MD [facility]   If 7PM-7AM, please contact night-coverage

## 2024-01-28 NOTE — Care Management Important Message (Signed)
 Important Message  Patient Details  Name: Kim [name] MRN: [id number] Date of Birth: 03/17/1956   Important Message Given:  Yes - Medicare IM     Claretta Deed 01/28/2024, 2:27 PM

## 2024-01-28 NOTE — Plan of Care (Signed)
  Problem: Education: Goal: Knowledge of General Education information will improve Description: Including pain rating scale, medication(s)/side effects and non-pharmacologic comfort measures Outcome: Progressing   Problem: Health Behavior/Discharge Planning: Goal: Ability to manage health-related needs will improve Outcome: Progressing   Problem: Clinical Measurements: Goal: Ability to maintain clinical measurements within normal limits will improve Outcome: Progressing Goal: Will remain free from infection Outcome: Progressing Goal: Diagnostic test results will improve Outcome: Progressing Goal: Respiratory complications will improve Outcome: Progressing Goal: Cardiovascular complication will be avoided Outcome: Progressing   Problem: Activity: Goal: Risk for activity intolerance will decrease Outcome: Not Progressing   Problem: Nutrition: Goal: Adequate nutrition will be maintained Outcome: Progressing   Problem: Coping: Goal: Level of anxiety will decrease Outcome: Progressing   Problem: Elimination: Goal: Will not experience complications related to bowel motility Outcome: Progressing Goal: Will not experience complications related to urinary retention Outcome: Progressing   Problem: Pain Managment: Goal: General experience of comfort will improve and/or be controlled Outcome: Progressing   Problem: Safety: Goal: Ability to remain free from injury will improve Outcome: Progressing   Problem: Skin Integrity: Goal: Risk for impaired skin integrity will decrease Outcome: Progressing   Problem: Education: Goal: Ability to describe self-care measures that may prevent or decrease complications (Diabetes Survival Skills Education) will improve Outcome: Progressing Goal: Individualized Educational Video(s) Outcome: Progressing   Problem: Coping: Goal: Ability to adjust to condition or change in health will improve Outcome: Progressing   Problem: Fluid  Volume: Goal: Ability to maintain a balanced intake and output will improve Outcome: Progressing   Problem: Health Behavior/Discharge Planning: Goal: Ability to identify and utilize available resources and services will improve Outcome: Progressing Goal: Ability to manage health-related needs will improve Outcome: Progressing   Problem: Metabolic: Goal: Ability to maintain appropriate glucose levels will improve Outcome: Progressing   Problem: Nutritional: Goal: Maintenance of adequate nutrition will improve Outcome: Progressing Goal: Progress toward achieving an optimal weight will improve Outcome: Progressing   Problem: Skin Integrity: Goal: Risk for impaired skin integrity will decrease Outcome: Not Progressing   Problem: Tissue Perfusion: Goal: Adequacy of tissue perfusion will improve Outcome: Progressing

## 2024-01-29 DIAGNOSIS — J441 Chronic obstructive pulmonary disease with (acute) exacerbation: Secondary | ICD-10-CM | POA: Diagnosis not present

## 2024-01-29 LAB — CBC
HCT: 35.3 % — ABNORMAL LOW (ref 36.0–46.0)
Hemoglobin: 10.8 g/dL — ABNORMAL LOW (ref 12.0–15.0)
MCH: 29.2 pg (ref 26.0–34.0)
MCHC: 30.6 g/dL (ref 30.0–36.0)
MCV: 95.4 fL (ref 80.0–100.0)
Platelets: 315 K/uL (ref 150–400)
RBC: 3.7 MIL/uL — ABNORMAL LOW (ref 3.87–5.11)
RDW: 15.5 % (ref 11.5–15.5)
WBC: 14.9 K/uL — ABNORMAL HIGH (ref 4.0–10.5)
nRBC: 0 % (ref 0.0–0.2)

## 2024-01-29 LAB — GLUCOSE, CAPILLARY: Glucose-Capillary: 201 mg/dL — ABNORMAL HIGH (ref 70–99)

## 2024-01-29 MED ORDER — GUAIFENESIN-DM 100-10 MG/5ML PO SYRP: 5.0000 mL | ORAL_SOLUTION | ORAL | 0 refills | Status: AC | PRN

## 2024-01-29 MED ORDER — PREDNISONE 20 MG PO TABS: 40.0000 mg | ORAL_TABLET | Freq: Every day | ORAL | 0 refills | Status: AC

## 2024-01-29 MED ORDER — DOXYCYCLINE HYCLATE 100 MG PO TABS: 100.0000 mg | ORAL_TABLET | Freq: Two times a day (BID) | ORAL | 0 refills | Status: AC

## 2024-01-29 NOTE — Discharge Instructions (Signed)
 Advised to follow-up with primary care physician in 1 week. Advised to take doxycycline  100 mg twice daily for 3 days for community-acquired pneumonia. Advised to continue prednisone  40 mg daily for 2 more days for COPD exacerbation.

## 2024-01-29 NOTE — Progress Notes (Signed)
 OT Cancellation Note  Patient Details Name: Kim [name] MRN: [id number] DOB: [birth year]   Cancelled Treatment:    Reason Eval/Treat Not Completed: Patient declined, no reason specified. Pt declined reporting I don't do therapy anymore. And declined both OT and PT during admission. Educated pt on need for ambulatory pulse ox sats, pt again declining all attempts. OT will sign off on pt.     Shterna Laramee C, OT  Acute Rehabilitation Services Office [phone] Secure chat preferred   Adrianne GORMAN Savers 01/29/2024, 9:57 AM

## 2024-01-29 NOTE — TOC Transition Note (Signed)
 Transition of Care [facility]) - Discharge Note   Patient Details  Name: Kim [name] MRN: [id number] Date of Birth: 10-Oct-1955  Transition of [facility]) CM/SW Contact:  Roxie KANDICE Stain, RN Phone Number: 01/29/2024, 10:51 AM   Clinical Narrative:    Patient stable to discharge home with hospice.  Notified [facility] of discharge.  Patient's spouse will bring portable 02 tank.     Final next level of care: Home w Hospice Care Barriers to Discharge: Barriers Resolved   Patient Goals and CMS Choice Patient states their goals for this hospitalization and ongoing recovery are:: return home with hospice CMS Medicare.gov Compare Post Acute Care list provided to:: Patient Choice offered to / list presented to : Patient      Discharge Placement  home                     Discharge Plan and Services Additional resources added to the After Visit Summary for     Discharge Planning Services: CM Consult Post Acute Care Choice: Hospice                      [facility] Agency: Other - See comment ([facility]) Date [facility] Agency Contacted: 01/29/24 Time HH Agency Contacted: 1051 Representative spoke with at [facility] Agency: leann  Social Drivers of Health (SDOH) Interventions SDOH Screenings   Food Insecurity: No Food Insecurity (01/26/2024)  Housing: Low Risk  (01/26/2024)  Transportation Needs: No Transportation Needs (01/26/2024)  Utilities: Not At Risk (01/26/2024)  Alcohol  Screen: Low Risk  (03/22/2020)  Depression (PHQ2-9): Medium Risk (09/10/2021)  Financial Resource Strain: High Risk (09/10/2021)  Physical Activity: Inactive (03/25/2021)  Social Connections: Moderately Isolated (01/26/2024)  Stress: No Stress Concern Present (09/10/2021)  Tobacco Use: Medium Risk (01/26/2024)     Readmission Risk Interventions    11/17/2023   11:05 AM 05/17/2023   12:49 PM 09/20/2021    3:27 PM  Readmission Risk Prevention Plan  Transportation Screening Complete  Complete Complete  PCP or Specialist Appt within 3-5 Days Complete Complete   HRI or Home Care Consult Complete Complete   Social Work Consult for Recovery Care Planning/Counseling  Complete   Palliative Care Screening Not Applicable Not Applicable   Medication Review Oceanographer) Complete Complete Complete  PCP or Specialist appointment within 3-5 days of discharge   Complete  HRI or Home Care Consult   Complete  SW Recovery Care/Counseling Consult   Complete  Palliative Care Screening   Not Applicable  Skilled Nursing Facility   Not Applicable

## 2024-01-29 NOTE — Inpatient Diabetes Management (Signed)
 Inpatient Diabetes Program Recommendations  AACE/ADA: New Consensus Statement on Inpatient Glycemic Control   Target Ranges:  Prepandial:   less than 140 mg/dL      Peak postprandial:   less than 180 mg/dL (1-2 hours)      Critically ill patients:  140 - 180 mg/dL    Latest Reference Range & Units 01/28/24 06:34 01/28/24 11:18 01/28/24 16:30 01/28/24 21:16 01/29/24 06:25  Glucose-Capillary 70 - 99 mg/dL 840 (H) 876 (H) 92 710 (H) 201 (H)   Review of Glycemic Control  Diabetes history: DM2 Outpatient Diabetes medications: None Current orders for Inpatient glycemic control: Novolog  0-9 units TID with meals; Solumedrol 80 mg Q24H  Inpatient Diabetes Program Recommendations:    Insulin : Please consider adding Novolog  0-5 units at bedtime.  Thanks, Earnie Gainer, RN, MSN, CDCES Diabetes Coordinator Inpatient Diabetes Program [phone] (Team Pager from 8am to 5pm)

## 2024-01-29 NOTE — Discharge Summary (Addendum)
 Physician Discharge Summary  Kim [name] FMW:[id number] DOB: 1955/11/11 DOA: 01/25/2024  PCP: Merna Huxley, NP  Admit date: 01/25/2024  Discharge date: 01/29/2024  Admitted From: Home  Disposition:  Home with hospice.  Recommendations for Outpatient Follow-up:  Follow up with PCP in 1-2 weeks. Please obtain BMP/CBC in one week Advised to take doxycycline  100 mg twice daily for 3 days for community-acquired pneumonia. Advised to continue prednisone  40 mg daily for 2 more days for COPD exacerbation. Advised to continue prednisone  10 mg daily afterwards.  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 She was saturating in 80s when EMS arrived. She was given Solu-Medrol , IV magnesium , epinephrine  and started on CPAP. CXR showed small blunting of right costophrenic angle concerning for small right pleural effusion. Patient was admitted for further evaluation.  Patient has made significant improvement with IV steroids and scheduled and as needed nebulized bronchodilators. Patient weaned down to her baseline oxygen requirement.  She was continued on azithromycin  and subsequently being discharged on doxycycline  100 mg twice daily for 3 more days.  Patient feels much improved and being discharged home today   Discharge Diagnoses:  Active Problems:   Acute on chronic respiratory failure with hypoxia and hypercapnia (HCC)   COPD exacerbation (HCC)   Leukocytosis   Chronic diastolic CHF (congestive heart failure) (HCC)   Hyperglycemia    Essential hypertension   Normocytic anemia   Chronic kidney disease, stage 3b (HCC)   Anxiety and depression   Hyperlipidemia  Acute on chronic hypoxic and hypercapnic respiratory failure:  COPD with acute exacerbation: She uses 2.5 to 3.5 L of supplemental O2 at baseline. She also remains on prednisone  10 mg daily at home. Also on BiPAP CXR suggesting small right pleural effusion.   COVID-19, flu and RSV PCR nonreactive Continued on Solu-Medrol  80 mg daily. Continue azithromycin  for 5 days. Continue Brovana , Pulmicort . Continue Yupelri . Continue DuoNebs every 2 hours as needed. Continue home morphine  and Lasix .  Continue supplemental oxygen to keep saturation above 88% regardless of home oxygen level. Weaned off BiPAP successfully.  Incentive spirometry, OOB, PT/OT Continue Mucolytics and antitussives. She is back to her baseline oxygen requirement.  Feels better,  wants to be discharged home.   Chronic combined CHF:  TTE in 09/2022 with LVEF of 35 to 40%, G1DD.   Appears euvolemic on exam but has moon face likely from chronic steroid use. Continue home Lasix  40 mg daily. Strict intake and output and daily weights.   Intake/Output Summary (Last 24 hours) at 01/28/2024 1028 Last data filed at 01/28/2024 0956    Gross per 24 hour  Intake 728 ml  Output 2650 ml  Net -1922 ml      NIDDM-2 with steroid-induced hyperglycemia:  HbA1c 6.7% (was 6.1% in 2024).  Not on meds. Continue SSI while on steroids.   Essential hypertension:  Normotensive for most part. Continue Lasix  as above.   CKD-3B:  Stable and at baseline. Continue monitoring   Normocytic anemia: Stable. Monitor H/H.  No any obvious visible bleeding.   Anxiety and depression: Continue Paxil  and Xanax  as needed.   Hyperlipidemia: Continue Crestor .   Physical deconditioning -  PT/OT >    Leukocytosis:  > Improving  Suspect demargination from steroids. - Continue monitoring   Class I obesity: Body mass  index is 30.08 kg/m. Diet and exercise discussed in detail.  Discharge Instructions  Discharge Instructions     Call MD for:  difficulty breathing, headache or visual disturbances   Complete by: As directed    Call MD for:  persistant dizziness or light-headedness   Complete by: As directed    Call MD for:  persistant nausea and vomiting   Complete by: As directed    Diet - low sodium heart healthy   Complete by: As directed    Diet Carb Modified   Complete by: As directed    Discharge instructions   Complete by: As directed    Advised to follow-up with primary care physician in 1 week. Advised to take doxycycline  100 mg twice daily for 3 days for community-acquired pneumonia. Advised to continue prednisone  40 mg daily for 2 more days for COPD exacerbation.   Increase activity slowly   Complete by: As directed       Allergies as of 01/29/2024       Reactions   Tape Other (See Comments)   Skin tears easily, peels skin off   Daliresp  [roflumilast ] Other (See Comments)   Skin peeling   Penicillins Hives   Cozaar  [losartan ] Other (See Comments)   Possible cause of severe hypotension   Dupixent  [dupilumab ] Diarrhea, Swelling, Other (See Comments), Cough   Facial swelling Headache   Latex Other (See Comments)   Peels skin off   Omnicef  [cefdinir ] Rash   Statins Other (See Comments)   Myalgia   Torsemide  Other (See Comments)   Severe stomach cramps and all over   Trazodone  And Nefazodone Other (See Comments)   Patient stated the last 2 times she took this medication gave her very bad migraine so she cut them out completely.   Zestril  [lisinopril ] Cough        Medication List     TAKE these medications    albuterol  108 (90 Base) MCG/ACT inhaler Commonly known as: Ventolin  HFA INHALE 2 PUFFS INTO THE LUNGS EVERY 4 TO 6 HOURS AS NEEDED FOR SHORTNESS OF BREATH OR WHEEZING What changed:  how much to take how to take this when to take this reasons to take  this additional instructions   ALPRAZolam  1 MG tablet Commonly known as: XANAX  TAKE 1 TABLET(1 MG) BY MOUTH THREE TIMES DAILY What changed: See the new instructions.   arformoterol  15 MCG/2ML Nebu Commonly known as: BROVANA  Use 2 mLs (15 mcg total) by nebulization 2 (two) times daily. What changed:  when to take this reasons to take this   aspirin  EC 81 MG tablet Take 1 tablet (81 mg total) by mouth daily. What changed: when to take this   budesonide  0.25 MG/2ML nebulizer solution Commonly known as: PULMICORT  Take 2 mLs (0.25 mg total) by nebulization 2 (two) times daily. What changed:  when to take this reasons to take this   butalbital -acetaminophen -caffeine  50-325-40 MG tablet Commonly known as: FIORICET  TAKE 1 TABLET BY MOUTH EVERY 4 HOURS AS NEEDED FOR HEADACHE   cetirizine 10 MG tablet Commonly known as: ZYRTEC Take 10 mg by mouth in the morning.   doxycycline  100 MG tablet Commonly known as: VIBRA -TABS Take 1 tablet (100 mg total) by mouth 2 (two) times daily for 3 days.   furosemide  40 MG tablet Commonly known as: LASIX  Take 1 tablet (40 mg  total) by mouth at bedtime.   guaiFENesin -dextromethorphan  100-10 MG/5ML syrup Commonly known as: ROBITUSSIN DM Take 5 mLs by mouth every 4 (four) hours as needed for cough (chest congestion).   HYDROcodone -acetaminophen  10-325 MG tablet Commonly known as: NORCO Take 1 tablet by mouth every 6 (six) hours as needed for moderate pain (pain score 4-6) or severe pain (pain score 7-10).   hydrocortisone  cream 1 % Apply 1 Application topically daily as needed for itching.   ipratropium-albuterol  0.5-2.5 (3) MG/3ML Soln Commonly known as: DUONEB Take 3 mLs by nebulization every 4 (four) hours as needed. What changed: when to take this   LORazepam  1 MG tablet Commonly known as: ATIVAN  Take 1 mg by mouth every 4 (four) hours as needed for anxiety.   magnesium  oxide 400 (240 Mg) MG tablet Commonly known as: MAG-OX Take  1 tablet by mouth daily.   meclizine  25 MG tablet Commonly known as: ANTIVERT  Take 25 mg by mouth every 8 (eight) hours as needed for dizziness.   morphine  CONCENTRATE 10 mg / 0.5 ml concentrated solution Take 0.5 mLs by mouth as needed for severe pain (pain score 7-10).   Mucinex  Maximum Strength 1200 MG Tb12 Generic drug: Guaifenesin  Take 1,200 mg by mouth every 12 (twelve) hours.   nitroGLYCERIN  0.4 MG SL tablet Commonly known as: NITROSTAT  Place 1 tablet (0.4 mg total) under the tongue every 5 (five) minutes as needed for chest pain.   nystatin  100000 UNIT/ML suspension Commonly known as: MYCOSTATIN  Take 5 mLs (500,000 Units total) by mouth 4 (four) times daily. What changed:  when to take this reasons to take this   OXYGEN Inhale 2.5-3.5 L/min into the lungs continuous.   PARoxetine  40 MG tablet Commonly known as: PAXIL  Take 0.5 tablets (20 mg total) by mouth in the morning. TAKE 1 TABLET(40 MG) BY MOUTH EVERY MORNING What changed:  how much to take additional instructions   predniSONE  20 MG tablet Commonly known as: DELTASONE  Take 2 tablets (40 mg total) by mouth daily with breakfast for 3 days. What changed:  medication strength how much to take when to take this Another medication with the same name was removed. Continue taking this medication, and follow the directions you see here.   promethazine -dextromethorphan  6.25-15 MG/5ML syrup Commonly known as: PROMETHAZINE -DM Take 5 mLs by mouth every 6 (six) hours as needed for cough.   rosuvastatin  20 MG tablet Commonly known as: CRESTOR  Take 1 tablet (20 mg total) by mouth daily. What changed: when to take this   Senokot 8.6 MG tablet Generic drug: senna Take 1 tablet by mouth 2 (two) times daily as needed for constipation.   Vitamin B-12 5000 MCG Subl Place 5,000 mcg under the tongue at bedtime.   Vitamin D3 125 MCG (5000 UT) Caps Take 5,000 Units by mouth at bedtime.        Follow-up Information      Nafziger, Darleene, NP Follow up in 1 week(s).   Specialty: Family Medicine Contact information: [address] [place] [address] [phone]         [facility] Follow up.   Why: home hospice Contact information: [address] [place] [address] [phone]                Allergies  Allergen Reactions   Tape Other (See Comments)    Skin tears easily, peels skin off   Daliresp  [Roflumilast ] Other (See Comments)    Skin peeling   Penicillins  Hives   Cozaar  [Losartan ] Other (See Comments)    Possible cause of severe hypotension   Dupixent  [Dupilumab ] Diarrhea, Swelling, Other (See Comments) and Cough    Facial swelling Headache   Latex Other (See Comments)    Peels skin off   Omnicef  [Cefdinir ] Rash   Statins Other (See Comments)    Myalgia    Torsemide  Other (See Comments)    Severe stomach cramps and all over   Trazodone  And Nefazodone Other (See Comments)    Patient stated the last 2 times she took this medication gave her very bad migraine so she cut them out completely.   Zestril  [Lisinopril ] Cough    Consultations: None   Procedures/Studies: DG Chest Port 1 View Result Date: 01/25/2024 EXAM: 1 VIEW XRAY OF THE CHEST 01/25/2024 11:09:00 AM COMPARISON: Chest radiograph dated 11/06/2023. CLINICAL HISTORY: cough, respiratory distress. FINDINGS: LUNGS AND PLEURA: Mild blunting of the right costophrenic angle could reflect a small right pleural effusion. No focal pulmonary opacity. No pulmonary edema. No pneumothorax. HEART AND MEDIASTINUM: No acute abnormality of the cardiac and mediastinal silhouettes. Aortic atherosclerosis. BONES AND SOFT TISSUES: No acute osseous abnormality. IMPRESSION: 1. Mild blunting of the right costophrenic angle could reflect a small right pleural effusion. Electronically signed by: Harrietta Sherry MD 01/25/2024 11:52 AM EDT RP Workstation: [id number]    Subjective: Patient was seen and  examined at bedside.  Overnight events noted. Patient reports feeling much improved,  breathing has improved and she wants to be discharged home.  Discharge Exam: Vitals:   01/29/24 0909 01/29/24 0915  BP:    Pulse: 83 82  Resp: (!) 28 20  Temp:    SpO2: 98% 99%   Vitals:   01/29/24 0442 01/29/24 0728 01/29/24 0909 01/29/24 0915  BP: 128/69 138/75    Pulse: 81 80 83 82  Resp: 18 15 (!) 28 20  Temp: 97.7 F (36.5 C) 97.8 F (36.6 C)    TempSrc: Oral Oral    SpO2: 96% 98% 98% 99%  Weight:      Height:        General: Pt is alert, awake, not in acute distress Cardiovascular: RRR, S1/S2 +, no rubs, no gallops Respiratory: CTA bilaterally, no wheezing, no rhonchi Abdominal: Soft, NT, ND, bowel sounds + Extremities: no edema, no cyanosis    The results of significant diagnostics from this hospitalization (including imaging, microbiology, ancillary and laboratory) are listed below for reference.     Microbiology: Recent Results (from the past 240 hours)  Resp panel by RT-PCR (RSV, Flu A&B, Covid) Anterior Nasal Swab     Status: None   Collection Time: 01/25/24 11:05 AM   Specimen: Anterior Nasal Swab  Result Value Ref Range Status   SARS Coronavirus 2 by RT PCR NEGATIVE NEGATIVE Final   Influenza A by PCR NEGATIVE NEGATIVE Final   Influenza B by PCR NEGATIVE NEGATIVE Final    Comment: (NOTE) The Xpert Xpress SARS-CoV-2/FLU/RSV plus assay is intended as an aid in the diagnosis of influenza from Nasopharyngeal swab specimens and should not be used as a sole basis for treatment. Nasal washings and aspirates are unacceptable for Xpert Xpress SARS-CoV-2/FLU/RSV testing.  Fact Sheet for Patients: BloggerCourse.com  Fact Sheet for Healthcare Providers: SeriousBroker.it  This test is not yet approved or cleared by the [place]  FDA and has been authorized for detection and/or diagnosis of SARS-CoV-2 by FDA under an  Emergency Use Authorization (EUA). This EUA will remain in effect (meaning this test can  be used) for the duration of the COVID-19 declaration under Section 564(b)(1) of the Act, 21 U.S.C. section 360bbb-3(b)(1), unless the authorization is terminated or revoked.     Resp Syncytial Virus by PCR NEGATIVE NEGATIVE Final    Comment: (NOTE) Fact Sheet for Patients: BloggerCourse.com  Fact Sheet for Healthcare Providers: SeriousBroker.it  This test is not yet approved or cleared by the [place]  FDA and has been authorized for detection and/or diagnosis of SARS-CoV-2 by FDA under an Emergency Use Authorization (EUA). This EUA will remain in effect (meaning this test can be used) for the duration of the COVID-19 declaration under Section 564(b)(1) of the Act, 21 U.S.C. section 360bbb-3(b)(1), unless the authorization is terminated or revoked.  Performed at [facility], [address], [place], [place] [address]   MRSA Next Gen by PCR, Nasal     Status: None   Collection Time: 01/26/24 12:48 AM   Specimen: Nasal Mucosa; Nasal Swab  Result Value Ref Range Status   MRSA by PCR Next Gen NOT DETECTED NOT DETECTED Final    Comment: (NOTE) The GeneXpert MRSA Assay (FDA approved for NASAL specimens only), is one component of a comprehensive MRSA colonization surveillance program. It is not intended to diagnose MRSA infection nor to guide or monitor treatment for MRSA infections. Test performance is not FDA approved in patients less than 52 years old. Performed at [facility], [address], [place], [place] [address]      Labs: BNP (last 3 results) Recent Labs    05/14/23 0518 08/06/23 2359 11/16/23 1002  BNP 138.4* 1,057.0* 151.4*   Basic Metabolic Panel: Recent Labs  Lab 01/25/24 1037 01/25/24 1044 01/26/24 0532 01/28/24 0244  NA 144 142 143 141  K 4.4 4.3 4.4 4.8  CL 102  --  104 100  CO2 27  --  29  33*  GLUCOSE 258*  --  166* 158*  BUN 22  --  26* 24*  CREATININE 1.47*  --  1.43* 1.19*  CALCIUM  9.6  --  9.2 9.4  MG  --   --   --  2.1  PHOS  --   --   --  4.2   Liver Function Tests: Recent Labs  Lab 01/25/24 1037  AST 33  ALT 26  ALKPHOS 82  BILITOT 0.5  PROT 5.9*  ALBUMIN 3.2*   No results for input(s): LIPASE, AMYLASE in the last 168 hours. No results for input(s): AMMONIA in the last 168 hours. CBC: Recent Labs  Lab 01/25/24 1037 01/25/24 1044 01/26/24 0807 01/28/24 0244 01/29/24 0440  WBC 23.1*  --  23.3* 16.7* 14.9*  NEUTROABS 10.9*  --   --   --   --   HGB 11.8* 12.2 10.7* 10.5* 10.8*  HCT 39.4 36.0 34.7* 33.7* 35.3*  MCV 98.3  --  96.1 94.1 95.4  PLT 341  --  299 323 315   Cardiac Enzymes: No results for input(s): CKTOTAL, CKMB, CKMBINDEX, TROPONINI in the last 168 hours. BNP: Invalid input(s): POCBNP CBG: Recent Labs  Lab 01/28/24 0634 01/28/24 1118 01/28/24 1630 01/28/24 2116 01/29/24 0625  GLUCAP 159* 123* 92 289* 201*   D-Dimer No results for input(s): DDIMER in the last 72 hours. Hgb A1c No results for input(s): HGBA1C in the last 72 hours. Lipid Profile No results for input(s): CHOL, HDL, LDLCALC, TRIG, CHOLHDL, LDLDIRECT in the last 72 hours. Thyroid  function studies No results for input(s): TSH, T4TOTAL, T3FREE, THYROIDAB in the  last 72 hours.  Invalid input(s): FREET3 Anemia work up No results for input(s): VITAMINB12, FOLATE, FERRITIN, TIBC, IRON, RETICCTPCT in the last 72 hours. Urinalysis    Component Value Date/Time   COLORURINE YELLOW 01/25/2024 2347   APPEARANCEUR CLEAR 01/25/2024 2347   LABSPEC 1.015 01/25/2024 2347   PHURINE 5.0 01/25/2024 2347   GLUCOSEU 150 (A) 01/25/2024 2347   HGBUR NEGATIVE 01/25/2024 2347   HGBUR trace-lysed 02/26/2010 0951   BILIRUBINUR NEGATIVE 01/25/2024 2347   BILIRUBINUR negative 04/29/2021 1243   KETONESUR NEGATIVE 01/25/2024 2347    PROTEINUR NEGATIVE 01/25/2024 2347   UROBILINOGEN 0.2 04/29/2021 1243   UROBILINOGEN 0.2 02/26/2010 0951   NITRITE NEGATIVE 01/25/2024 2347   LEUKOCYTESUR TRACE (A) 01/25/2024 2347   Sepsis Labs Recent Labs  Lab 01/25/24 1037 01/26/24 0807 01/28/24 0244 01/29/24 0440  WBC 23.1* 23.3* 16.7* 14.9*   Microbiology Recent Results (from the past 240 hours)  Resp panel by RT-PCR (RSV, Flu A&B, Covid) Anterior Nasal Swab     Status: None   Collection Time: 01/25/24 11:05 AM   Specimen: Anterior Nasal Swab  Result Value Ref Range Status   SARS Coronavirus 2 by RT PCR NEGATIVE NEGATIVE Final   Influenza A by PCR NEGATIVE NEGATIVE Final   Influenza B by PCR NEGATIVE NEGATIVE Final    Comment: (NOTE) The Xpert Xpress SARS-CoV-2/FLU/RSV plus assay is intended as an aid in the diagnosis of influenza from Nasopharyngeal swab specimens and should not be used as a sole basis for treatment. Nasal washings and aspirates are unacceptable for Xpert Xpress SARS-CoV-2/FLU/RSV testing.  Fact Sheet for Patients: BloggerCourse.com  Fact Sheet for Healthcare Providers: SeriousBroker.it  This test is not yet approved or cleared by the [place]  FDA and has been authorized for detection and/or diagnosis of SARS-CoV-2 by FDA under an Emergency Use Authorization (EUA). This EUA will remain in effect (meaning this test can be used) for the duration of the COVID-19 declaration under Section 564(b)(1) of the Act, 21 U.S.C. section 360bbb-3(b)(1), unless the authorization is terminated or revoked.     Resp Syncytial Virus by PCR NEGATIVE NEGATIVE Final    Comment: (NOTE) Fact Sheet for Patients: BloggerCourse.com  Fact Sheet for Healthcare Providers: SeriousBroker.it  This test is not yet approved or cleared by the [place]  FDA and has been authorized for detection and/or diagnosis of  SARS-CoV-2 by FDA under an Emergency Use Authorization (EUA). This EUA will remain in effect (meaning this test can be used) for the duration of the COVID-19 declaration under Section 564(b)(1) of the Act, 21 U.S.C. section 360bbb-3(b)(1), unless the authorization is terminated or revoked.  Performed at [facility], [address], [place], [place] [address]   MRSA Next Gen by PCR, Nasal     Status: None   Collection Time: 01/26/24 12:48 AM   Specimen: Nasal Mucosa; Nasal Swab  Result Value Ref Range Status   MRSA by PCR Next Gen NOT DETECTED NOT DETECTED Final    Comment: (NOTE) The GeneXpert MRSA Assay (FDA approved for NASAL specimens only), is one component of a comprehensive MRSA colonization surveillance program. It is not intended to diagnose MRSA infection nor to guide or monitor treatment for MRSA infections. Test performance is not FDA approved in patients less than 46 years old. Performed at [facility], [address], [place], [place] [address]      Time coordinating discharge: Over 30 minutes  SIGNED:   Darcel Dawley, MD  [facility] 01/29/2024, 12:20  PM Pager   If 7PM-7AM, please contact night-coverage

## 2024-01-29 NOTE — Plan of Care (Signed)
   Problem: Education: Goal: Knowledge of General Education information will improve Description: Including pain rating scale, medication(s)/side effects and non-pharmacologic comfort measures Outcome: Progressing   Problem: Clinical Measurements: Goal: Will remain free from infection Outcome: Progressing Goal: Diagnostic test results will improve Outcome: Progressing Goal: Respiratory complications will improve Outcome: Progressing

## 2024-02-01 ENCOUNTER — Telehealth: Payer: Self-pay

## 2024-02-01 ENCOUNTER — Other Ambulatory Visit: Payer: Self-pay

## 2024-02-01 NOTE — Transitions of Care (Post Inpatient/ED Visit) (Signed)
   02/01/2024  Name: Kim [name] MRN: [id number] DOB: Jul 07, 1955  Today's TOC FU Call Status: Today's TOC FU Call Status:: Unsuccessful Call (1st Attempt) Unsuccessful Call (1st Attempt) Date: 02/01/24 (unsucccessful call to patient but successful call to [facility] hospice) Patient's Name and Date of Birth confirmed.  Transition Care Management Follow-up Telephone Call Date of Discharge: 01/29/24 Discharge Facility: [name]) Type of Discharge: Inpatient Admission Primary Inpatient Discharge Diagnosis:: Acute on chronic respiratory failure with hypoxia and hypercapnia  Placed call to patient with no answer. Placed call to [facility] and spoke with Leann who confirmed patient is active with hospice.  Alan Ee, RN, BSN, CEN [employer]- Transition of Care Team.  [facility] [phone]

## 2024-02-03 ENCOUNTER — Other Ambulatory Visit (HOSPITAL_COMMUNITY): Payer: Self-pay

## 2024-02-13 ENCOUNTER — Other Ambulatory Visit: Payer: Self-pay

## 2024-02-13 ENCOUNTER — Inpatient Hospital Stay (HOSPITAL_COMMUNITY)
Admission: EM | Admit: 2024-02-13 | Discharge: 2024-02-18 | DRG: 189 | Disposition: A | Discharge status: Hospice | Attending: Internal Medicine | Admitting: Internal Medicine

## 2024-02-13 ENCOUNTER — Encounter (HOSPITAL_COMMUNITY): Payer: Self-pay | Admitting: Internal Medicine

## 2024-02-13 ENCOUNTER — Emergency Department (HOSPITAL_COMMUNITY)

## 2024-02-13 DIAGNOSIS — Z9049 Acquired absence of other specified parts of digestive tract: Secondary | ICD-10-CM

## 2024-02-13 DIAGNOSIS — Z90710 Acquired absence of both cervix and uterus: Secondary | ICD-10-CM

## 2024-02-13 DIAGNOSIS — Z91048 Other nonmedicinal substance allergy status: Secondary | ICD-10-CM

## 2024-02-13 DIAGNOSIS — Z66 Do not resuscitate: Secondary | ICD-10-CM | POA: Diagnosis present

## 2024-02-13 DIAGNOSIS — Z833 Family history of diabetes mellitus: Secondary | ICD-10-CM

## 2024-02-13 DIAGNOSIS — Z7951 Long term (current) use of inhaled steroids: Secondary | ICD-10-CM

## 2024-02-13 DIAGNOSIS — Z87891 Personal history of nicotine dependence: Secondary | ICD-10-CM

## 2024-02-13 DIAGNOSIS — Z8249 Family history of ischemic heart disease and other diseases of the circulatory system: Secondary | ICD-10-CM

## 2024-02-13 DIAGNOSIS — Z820 Family history of epilepsy and other diseases of the nervous system: Secondary | ICD-10-CM

## 2024-02-13 DIAGNOSIS — F32A Depression, unspecified: Secondary | ICD-10-CM | POA: Diagnosis present

## 2024-02-13 DIAGNOSIS — Z6831 Body mass index (BMI) 31.0-31.9, adult: Secondary | ICD-10-CM

## 2024-02-13 DIAGNOSIS — I5043 Acute on chronic combined systolic (congestive) and diastolic (congestive) heart failure: Secondary | ICD-10-CM | POA: Diagnosis not present

## 2024-02-13 DIAGNOSIS — Z823 Family history of stroke: Secondary | ICD-10-CM

## 2024-02-13 DIAGNOSIS — N1832 Chronic kidney disease, stage 3b: Secondary | ICD-10-CM | POA: Diagnosis present

## 2024-02-13 DIAGNOSIS — Z9981 Dependence on supplemental oxygen: Secondary | ICD-10-CM

## 2024-02-13 DIAGNOSIS — Z888 Allergy status to other drugs, medicaments and biological substances status: Secondary | ICD-10-CM

## 2024-02-13 DIAGNOSIS — D649 Anemia, unspecified: Secondary | ICD-10-CM | POA: Diagnosis present

## 2024-02-13 DIAGNOSIS — Z87442 Personal history of urinary calculi: Secondary | ICD-10-CM

## 2024-02-13 DIAGNOSIS — I2489 Other forms of acute ischemic heart disease: Secondary | ICD-10-CM | POA: Diagnosis present

## 2024-02-13 DIAGNOSIS — Z7982 Long term (current) use of aspirin: Secondary | ICD-10-CM

## 2024-02-13 DIAGNOSIS — Z59868 Other specified financial insecurity: Secondary | ICD-10-CM

## 2024-02-13 DIAGNOSIS — I5A Non-ischemic myocardial injury (non-traumatic): Secondary | ICD-10-CM | POA: Diagnosis not present

## 2024-02-13 DIAGNOSIS — Z515 Encounter for palliative care: Secondary | ICD-10-CM

## 2024-02-13 DIAGNOSIS — J9621 Acute and chronic respiratory failure with hypoxia: Secondary | ICD-10-CM | POA: Diagnosis not present

## 2024-02-13 DIAGNOSIS — Z880 Allergy status to penicillin: Secondary | ICD-10-CM

## 2024-02-13 DIAGNOSIS — F410 Panic disorder [episodic paroxysmal anxiety] without agoraphobia: Secondary | ICD-10-CM | POA: Diagnosis present

## 2024-02-13 DIAGNOSIS — Z8261 Family history of arthritis: Secondary | ICD-10-CM

## 2024-02-13 DIAGNOSIS — D72829 Elevated white blood cell count, unspecified: Secondary | ICD-10-CM | POA: Diagnosis present

## 2024-02-13 DIAGNOSIS — T380X5A Adverse effect of glucocorticoids and synthetic analogues, initial encounter: Secondary | ICD-10-CM | POA: Diagnosis not present

## 2024-02-13 DIAGNOSIS — I2510 Atherosclerotic heart disease of native coronary artery without angina pectoris: Secondary | ICD-10-CM | POA: Diagnosis present

## 2024-02-13 DIAGNOSIS — G4733 Obstructive sleep apnea (adult) (pediatric): Secondary | ICD-10-CM | POA: Diagnosis present

## 2024-02-13 DIAGNOSIS — Z808 Family history of malignant neoplasm of other organs or systems: Secondary | ICD-10-CM

## 2024-02-13 DIAGNOSIS — I428 Other cardiomyopathies: Secondary | ICD-10-CM | POA: Diagnosis present

## 2024-02-13 DIAGNOSIS — Z8349 Family history of other endocrine, nutritional and metabolic diseases: Secondary | ICD-10-CM

## 2024-02-13 DIAGNOSIS — Z8262 Family history of osteoporosis: Secondary | ICD-10-CM

## 2024-02-13 DIAGNOSIS — J441 Chronic obstructive pulmonary disease with (acute) exacerbation: Secondary | ICD-10-CM | POA: Diagnosis not present

## 2024-02-13 DIAGNOSIS — Z79899 Other long term (current) drug therapy: Secondary | ICD-10-CM

## 2024-02-13 DIAGNOSIS — I130 Hypertensive heart and chronic kidney disease with heart failure and stage 1 through stage 4 chronic kidney disease, or unspecified chronic kidney disease: Secondary | ICD-10-CM | POA: Diagnosis present

## 2024-02-13 DIAGNOSIS — R739 Hyperglycemia, unspecified: Secondary | ICD-10-CM | POA: Diagnosis not present

## 2024-02-13 DIAGNOSIS — Z91040 Latex allergy status: Secondary | ICD-10-CM

## 2024-02-13 DIAGNOSIS — Z7952 Long term (current) use of systemic steroids: Secondary | ICD-10-CM

## 2024-02-13 DIAGNOSIS — E66811 Obesity, class 1: Secondary | ICD-10-CM | POA: Diagnosis present

## 2024-02-13 DIAGNOSIS — E785 Hyperlipidemia, unspecified: Secondary | ICD-10-CM | POA: Diagnosis present

## 2024-02-13 DIAGNOSIS — I3139 Other pericardial effusion (noninflammatory): Secondary | ICD-10-CM | POA: Diagnosis present

## 2024-02-13 LAB — BLOOD GAS, VENOUS
Acid-Base Excess: 3.2 mmol/L — ABNORMAL HIGH (ref 0.0–2.0)
Bicarbonate: 31 mmol/L — ABNORMAL HIGH (ref 20.0–28.0)
O2 Saturation: 60.1 %
Patient temperature: 37
pCO2, Ven: 63 mmHg — ABNORMAL HIGH (ref 44–60)
pH, Ven: 7.3 (ref 7.25–7.43)
pO2, Ven: 34 mmHg (ref 32–45)

## 2024-02-13 LAB — CBC WITH DIFFERENTIAL/PLATELET
Abs Immature Granulocytes: 0.13 K/uL — ABNORMAL HIGH (ref 0.00–0.07)
Basophils Absolute: 0.1 K/uL (ref 0.0–0.1)
Basophils Relative: 1 %
Eosinophils Absolute: 0.1 K/uL (ref 0.0–0.5)
Eosinophils Relative: 0 %
HCT: 38.7 % (ref 36.0–46.0)
Hemoglobin: 11.8 g/dL — ABNORMAL LOW (ref 12.0–15.0)
Immature Granulocytes: 1 %
Lymphocytes Relative: 27 %
Lymphs Abs: 5.3 K/uL — ABNORMAL HIGH (ref 0.7–4.0)
MCH: 29.8 pg (ref 26.0–34.0)
MCHC: 30.5 g/dL (ref 30.0–36.0)
MCV: 97.7 fL (ref 80.0–100.0)
Monocytes Absolute: 1.4 K/uL — ABNORMAL HIGH (ref 0.1–1.0)
Monocytes Relative: 7 %
Neutro Abs: 12.4 K/uL — ABNORMAL HIGH (ref 1.7–7.7)
Neutrophils Relative %: 64 %
Platelets: 305 K/uL (ref 150–400)
RBC: 3.96 MIL/uL (ref 3.87–5.11)
RDW: 15.7 % — ABNORMAL HIGH (ref 11.5–15.5)
WBC: 19.3 K/uL — ABNORMAL HIGH (ref 4.0–10.5)
nRBC: 0 % (ref 0.0–0.2)

## 2024-02-13 LAB — MAGNESIUM: Magnesium: 2.5 mg/dL — ABNORMAL HIGH (ref 1.7–2.4)

## 2024-02-13 LAB — COMPREHENSIVE METABOLIC PANEL WITH GFR
ALT: 35 U/L (ref 0–44)
AST: 44 U/L — ABNORMAL HIGH (ref 15–41)
Albumin: 3.6 g/dL (ref 3.5–5.0)
Alkaline Phosphatase: 70 U/L (ref 38–126)
Anion gap: 14 (ref 5–15)
BUN: 18 mg/dL (ref 8–23)
CO2: 24 mmol/L (ref 22–32)
Calcium: 8.7 mg/dL — ABNORMAL LOW (ref 8.9–10.3)
Chloride: 107 mmol/L (ref 98–111)
Creatinine, Ser: 1.36 mg/dL — ABNORMAL HIGH (ref 0.44–1.00)
GFR, Estimated: 42 mL/min — ABNORMAL LOW (ref >60)
Glucose, Bld: 111 mg/dL — ABNORMAL HIGH (ref 70–99)
Potassium: 4.5 mmol/L (ref 3.5–5.1)
Sodium: 145 mmol/L (ref 135–145)
Total Bilirubin: 0.6 mg/dL (ref 0.0–1.2)
Total Protein: 6.1 g/dL — ABNORMAL LOW (ref 6.5–8.1)

## 2024-02-13 LAB — RESP PANEL BY RT-PCR (RSV, FLU A&B, COVID)  RVPGX2
Influenza A by PCR: NEGATIVE
Influenza B by PCR: NEGATIVE
Resp Syncytial Virus by PCR: NEGATIVE
SARS Coronavirus 2 by RT PCR: NEGATIVE

## 2024-02-13 LAB — HIV ANTIBODY (ROUTINE TESTING W REFLEX): HIV Screen 4th Generation wRfx: NONREACTIVE

## 2024-02-13 LAB — TROPONIN I (HIGH SENSITIVITY)
Troponin I (High Sensitivity): 35 ng/L — ABNORMAL HIGH (ref <18)
Troponin I (High Sensitivity): 93 ng/L — ABNORMAL HIGH (ref <18)

## 2024-02-13 LAB — BRAIN NATRIURETIC PEPTIDE: B Natriuretic Peptide: 486.1 pg/mL — ABNORMAL HIGH (ref 0.0–100.0)

## 2024-02-13 MED ORDER — FUROSEMIDE 20 MG PO TABS
20.0000 mg | ORAL_TABLET | Freq: Every day | ORAL | Status: DC
  Administered 2024-02-13: 20 mg via ORAL
  Filled 2024-02-13: qty 1

## 2024-02-13 MED ORDER — ALBUTEROL SULFATE (2.5 MG/3ML) 0.083% IN NEBU
3.0000 mL | INHALATION_SOLUTION | RESPIRATORY_TRACT | Status: DC | PRN
  Administered 2024-02-14 – 2024-02-15 (×3): 3 mL via RESPIRATORY_TRACT
  Filled 2024-02-13 (×4): qty 3

## 2024-02-13 MED ORDER — MORPHINE SULFATE (PF) 2 MG/ML IV SOLN
1.0000 mg | INTRAVENOUS | Status: DC | PRN
  Administered 2024-02-14: 1 mg via INTRAVENOUS
  Filled 2024-02-13 (×2): qty 1

## 2024-02-13 MED ORDER — METHYLPREDNISOLONE SODIUM SUCC 40 MG IJ SOLR
40.0000 mg | Freq: Two times a day (BID) | INTRAMUSCULAR | Status: DC
  Administered 2024-02-13 – 2024-02-17 (×9): 40 mg via INTRAVENOUS
  Filled 2024-02-13 (×9): qty 1

## 2024-02-13 MED ORDER — ROSUVASTATIN CALCIUM 20 MG PO TABS
20.0000 mg | ORAL_TABLET | Freq: Every day | ORAL | Status: DC
  Administered 2024-02-13 – 2024-02-17 (×5): 20 mg via ORAL
  Filled 2024-02-13 (×5): qty 1

## 2024-02-13 MED ORDER — MAGNESIUM SULFATE 2 GM/50ML IV SOLN
2.0000 g | Freq: Once | INTRAVENOUS | Status: AC
  Administered 2024-02-13: 2 g via INTRAVENOUS
  Filled 2024-02-13: qty 50

## 2024-02-13 MED ORDER — ARFORMOTEROL TARTRATE 15 MCG/2ML IN NEBU
15.0000 ug | INHALATION_SOLUTION | Freq: Two times a day (BID) | RESPIRATORY_TRACT | Status: DC
  Administered 2024-02-13 – 2024-02-18 (×10): 15 ug via RESPIRATORY_TRACT
  Filled 2024-02-13 (×10): qty 2

## 2024-02-13 MED ORDER — PAROXETINE HCL 20 MG PO TABS
40.0000 mg | ORAL_TABLET | Freq: Every morning | ORAL | Status: DC
  Administered 2024-02-14 – 2024-02-18 (×5): 40 mg via ORAL
  Filled 2024-02-13 (×5): qty 2

## 2024-02-13 MED ORDER — ALPRAZOLAM 0.5 MG PO TABS
1.0000 mg | ORAL_TABLET | Freq: Two times a day (BID) | ORAL | Status: DC | PRN
  Administered 2024-02-13 – 2024-02-14 (×2): 1 mg via ORAL
  Filled 2024-02-13: qty 2
  Filled 2024-02-13: qty 4

## 2024-02-13 MED ORDER — ALBUTEROL SULFATE (2.5 MG/3ML) 0.083% IN NEBU
5.0000 mg | INHALATION_SOLUTION | Freq: Once | RESPIRATORY_TRACT | Status: AC
  Administered 2024-02-13: 5 mg via RESPIRATORY_TRACT
  Filled 2024-02-13: qty 6

## 2024-02-13 MED ORDER — IPRATROPIUM BROMIDE 0.02 % IN SOLN
0.5000 mg | Freq: Once | RESPIRATORY_TRACT | Status: AC
  Administered 2024-02-13: 0.5 mg via RESPIRATORY_TRACT
  Filled 2024-02-13: qty 2.5

## 2024-02-13 MED ORDER — BUDESONIDE 0.25 MG/2ML IN SUSP
0.2500 mg | Freq: Two times a day (BID) | RESPIRATORY_TRACT | Status: DC
  Administered 2024-02-13 – 2024-02-18 (×10): 0.25 mg via RESPIRATORY_TRACT
  Filled 2024-02-13 (×11): qty 2

## 2024-02-13 MED ORDER — NITROGLYCERIN 0.4 MG SL SUBL: 0.4000 mg | SUBLINGUAL_TABLET | SUBLINGUAL | Status: DC | PRN

## 2024-02-13 NOTE — Consult Note (Signed)
 Cardiology Consultation   Patient ID: Kim [name] MRN: [id number]; DOB: 09/11/1955  Admit date: 02/13/2024 Date of Consult: 02/13/2024  PCP:  Kim Huxley, NP   [facility] Providers Cardiologist:  Kim Cash, MD   { Click here to update MD or APP on Care Team, Refresh:1}     Patient Profile: Kim [name] is a 68 y.o. 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     History of Present Illness: [name] ***   Labs showed WBC 19K, Cr 1.36 (at baseline), troponin 35 => BNP 486, CXR with no acute processes   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/2008   OSTEOARTHRITIS 06/05/2009   PARESTHESIA 08/24/2007   TOBACCO ABUSE 01/25/2010    Past Surgical History:  Procedure Laterality Date   ABDOMINAL HYSTERECTOMY     APPENDECTOMY  1969   CARDIAC CATHETERIZATION N/A 07/05/2015   Procedure: Left Heart Cath and Coronary Angiography;  Surgeon: Kim LELON Clay, MD;  Location: [facility] INVASIVE CV LAB;  Service: Cardiovascular;  Laterality: N/A;   CHOLECYSTECTOMY  1989   collapse lung  1990   COLONOSCOPY WITH PROPOFOL  N/A 05/14/2018   Procedure: COLONOSCOPY WITH PROPOFOL ;  Surgeon: Kim Norleen SAILOR, MD;  Location: [facility] ENDOSCOPY;  Service: Endoscopy;  Laterality: N/A;   EXCISION MASS ABDOMINAL  2019   EXPLORATORY LAPAROTOMY  1969   LEFT HEART CATH AND CORONARY ANGIOGRAPHY N/A 11/19/2020   Procedure: LEFT HEART CATH AND CORONARY ANGIOGRAPHY;  Surgeon: Kim Lonni, MD;  Location: [facility] INVASIVE CV LAB;  Service: Cardiovascular;  Laterality: N/A;   POLYPECTOMY  05/14/2018   Procedure: POLYPECTOMY;   Surgeon: Kim Norleen SAILOR, MD;  Location: [facility] ENDOSCOPY;  Service: Endoscopy;;   SALPINGOOPHORECTOMY  2019     {Home Medications (Optional):21181}  Scheduled Meds:  arformoterol   15 mcg Nebulization BID   budesonide   0.25 mg Nebulization BID   furosemide   20 mg Oral QHS   [START ON 02/14/2024] methylPREDNISolone  (SOLU-MEDROL ) injection  40 mg Intravenous Q12H   [START ON 02/14/2024] PARoxetine   40 mg Oral q AM   rosuvastatin   20 mg Oral QHS   Continuous Infusions:  PRN Meds: albuterol , ALPRAZolam , morphine  injection, nitroGLYCERIN   Allergies:    Allergies  Allergen Reactions   Tape Other (See Comments)    Skin tears easily, peels skin off   Daliresp  [Roflumilast ] Other (See Comments)    Skin peeling   Penicillins Hives   Cozaar  [Losartan ] Other (See Comments)    Possible cause of severe hypotension   Dupixent  [Dupilumab ] Diarrhea, Swelling, Other (See Comments) and Cough    Facial swelling Headache   Latex Other (See Comments)    Peels skin off   Omnicef  [Cefdinir ] Rash   Statins Other (See Comments)    Myalgia    Torsemide  Other (See Comments)    Severe stomach cramps and all over   Trazodone  And Nefazodone Other (See Comments)    Patient stated the last 2 times she took this medication gave her very bad migraine so she cut them out completely.   Zestril  [Lisinopril ] Cough    Social History:   Social History  Socioeconomic History   Marital status: Married    Spouse name: Kim [name]   Number of children: 2   Years of education: Not on file   Highest education level: Not on file  Occupational History   Occupation: Patient care aide-sits with elderly lady  Tobacco Use   Smoking status: Former    Current packs/day: 0.00    Average packs/day: 2.0 packs/day for 38.0 years (76.0 ttl pk-yrs)    Types: Cigarettes    Start date: 03/07/1976    Quit date: 03/07/2014    Years since quitting: 9.9   Smokeless tobacco: Never  Vaping Use   Vaping status: Never Used  Substance  and Sexual Activity   Alcohol  use: No    Alcohol /week: 0.0 standard drinks of alcohol    Drug use: No   Sexual activity: Not on file  Other Topics Concern   Not on file  Social History Narrative   [facility] (01/12/17):   Originally from [facility]. Has lived in [place] for 17 years. Married. Has 2 dogs currently. No mold and only 1 indoor plant. Had her home professionally cleaned a month ago. No bird or hot tub exposure. 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    Right handed    Social Drivers of Health   Financial Resource Strain: High Risk (09/10/2021)   Overall Financial Resource Strain (CARDIA)    Difficulty of Paying Living Expenses: Very hard  Food Insecurity: No Food Insecurity (02/13/2024)   Hunger Vital Sign    Worried About Running Out of Food in the Last Year: Never true    Ran Out of Food in the Last Year: Never true  Transportation Needs: No Transportation Needs (02/13/2024)   PRAPARE - Administrator, Civil Service (Medical): No    Lack of Transportation (Non-Medical): No  Physical Activity: Inactive (03/25/2021)   Exercise Vital Sign    Days of Exercise per Week: 0 days    Minutes of Exercise per Session: 0 min  Stress: No Stress Concern Present (09/10/2021)   [employer] of Occupational Health - Occupational Stress Questionnaire    Feeling of Stress : Not at all  Social Connections: Moderately Isolated (02/13/2024)   Social Connection and Isolation Panel    Frequency of Communication with Friends and Family: More than three times a week    Frequency of Social Gatherings with Friends and Family: More than three times a week    Attends Religious Services: Never    Database Administrator or Organizations: No    Attends Banker Meetings: Never    Marital Status: Married  Catering Manager Violence: Not At Risk (02/13/2024)   Humiliation, Afraid, Rape, and Kick questionnaire    Fear of Current or Ex-Partner: No     Emotionally Abused: No    Physically Abused: No    Sexually Abused: No    Family History:   *** Family History  Problem Relation Age of Onset   Cancer Mother        lung   Heart attack Mother    Hypertension Sister    Multiple sclerosis Brother    Diabetes Sister    Rheum arthritis Sister    Thyroid  disease Sister    Multiple sclerosis Other    Alcohol  abuse Other    Arthritis Other    Diabetes Other    Kidney disease Other    Cancer Other        lung,ovarian,skin, uterine  Stroke Other    Heart disease Other    Melanoma Other    Osteoporosis Other    Heart attack Maternal Uncle    Heart attack Other        NEICE   Heart attack Other        NEPHEW   Hypertension Brother    Stroke Maternal Grandmother    Colon cancer Neg Hx      ROS:  Please see the history of present illness.  *** All other ROS reviewed and negative.     Physical Exam/Data: Vitals:   02/13/24 1934 02/13/24 1935 02/13/24 2222 02/13/24 2227  BP: 124/66   124/66  Pulse:  95  84  Resp: 15 20  20   Temp:    97.6 F (36.4 C)  TempSrc:    Oral  SpO2:  97%  100%  Weight:   72.2 kg   Height:   5' 1 (1.549 m)     Intake/Output Summary (Last 24 hours) at 02/13/2024 2355 Last data filed at 02/13/2024 1949 Gross per 24 hour  Intake 50 ml  Output --  Net 50 ml      02/13/2024   10:22 PM 01/26/2024   12:45 AM 01/25/2024   10:53 AM  Last 3 Weights  Weight (lbs) 159 lb 2.8 oz 159 lb 2.8 oz 147 lb 11.3 oz  Weight (kg) 72.2 kg 72.2 kg 67 kg     Body mass index is 30.08 kg/m.  General:  Well nourished, well developed, in no acute distress*** HEENT: normal Neck: no JVD Vascular: No carotid bruits; Distal pulses 2+ bilaterally Cardiac:  normal S1, S2; RRR; no murmur *** Lungs:  clear to auscultation bilaterally, no wheezing, rhonchi or rales  Abd: soft, nontender, no hepatomegaly  Ext: no edema Musculoskeletal:  No deformities, BUE and BLE strength normal and equal Skin: warm and dry   Neuro:  CNs 2-12 intact, no focal abnormalities noted Psych:  Normal affect   EKG:  The EKG was personally reviewed and demonstrates:  *** Telemetry:  Telemetry was personally reviewed and demonstrates:  ***  Relevant CV Studies: ***  Laboratory Data: High Sensitivity Troponin:   Recent Labs  Lab 02/13/24 1816 02/13/24 2119  TROPONINIHS 35* 93*     Chemistry Recent Labs  Lab 02/13/24 1816  NA 145  K 4.5  CL 107  CO2 24  GLUCOSE 111*  BUN 18  CREATININE 1.36*  CALCIUM  8.7*  MG 2.5*  GFRNONAA 42*  ANIONGAP 14    Recent Labs  Lab 02/13/24 1816  PROT 6.1*  ALBUMIN 3.6  AST 44*  ALT 35  ALKPHOS 70  BILITOT 0.6   Lipids No results for input(s): CHOL, TRIG, HDL, LABVLDL, LDLCALC, CHOLHDL in the last 168 hours.  Hematology Recent Labs  Lab 02/13/24 1816  WBC 19.3*  RBC 3.96  HGB 11.8*  HCT 38.7  MCV 97.7  MCH 29.8  MCHC 30.5  RDW 15.7*  PLT 305   Thyroid  No results for input(s): TSH, FREET4 in the last 168 hours.  BNP Recent Labs  Lab 02/13/24 1816  BNP 486.1*    DDimer No results for input(s): DDIMER in the last 168 hours.  Radiology/Studies:  DG Chest Port 1 View Result Date: 02/13/2024 EXAM: 1 VIEW(S) XRAY OF THE CHEST 02/13/2024 06:35:00 PM COMPARISON: 01/25/2024 CLINICAL HISTORY: dyspnea FINDINGS: LUNGS AND PLEURA: No focal pulmonary opacity. No pulmonary edema. No pleural effusion. No pneumothorax. HEART AND MEDIASTINUM: No acute abnormality of the cardiac and  mediastinal silhouettes. Atherosclerotic plaque. BONES AND SOFT TISSUES: No acute osseous abnormality. IMPRESSION: 1. No acute process. Electronically signed by: Norman Gatlin MD 02/13/2024 06:50 PM EST RP Workstation: [id number]     Assessment and Plan: ***   Risk Assessment/Risk Scores: {Complete the following score calculators/questions to meet required metrics.  Press F2         :[id number]}       {Does this patient have ATRIAL  FIBRILLATION?:[phone]}  {Are we signing off today?:[id number]}  For questions or updates, please contact [facility] Please consult [email or web address] for contact info under    {TIP  Split Shared Billing  Do NOT delete any part of this including brackets If split shared billing is based upon MDM, disregard If billing will be based upon TIME you MUST document the number of minutes and a detailed list of what was done in that time in the following format Example - I spent ** minutes seeing this patient. During that time I reviewed their history, evaluated their symptoms, reviewed available labs, EKGs, studies, performed an exam and formulated an assessment and plan   :1} {Select this only if you need to document critical care time (Optional):[phone]} Signed, Gillian CHRISTELLA Cass, MD  02/13/2024 11:55 PM

## 2024-02-13 NOTE — ED Provider Notes (Signed)
 [place] EMERGENCY DEPARTMENT AT [facility] Provider Note   CSN: [id number] Arrival date & time: 02/13/24  1800     Patient presents with: No chief complaint on file.   Kim [name] is a 68 y.o. female.   68 year old female with past medical history significant for CHF, COPD on baseline of 2.5-3.5 supplemental O2 presents today for concern of shortness of breath.  She was recently discharged but states over the past couple days her shortness of breath has been worsening including wheezing, and worsening productive cough of green sputum.  She used DuoNebs at home prior to calling EMS.  She had done 2 rounds herself and received an additional 2 g with EMS.  Received Solu-Medrol  with EMS.   The history is provided by the patient. No language interpreter was used.       Prior to Admission medications   Medication Sig Start Date End Date Taking? Authorizing Provider  albuterol  (VENTOLIN  HFA) 108 (90 Base) MCG/ACT inhaler INHALE 2 PUFFS INTO THE LUNGS EVERY 4 TO 6 HOURS AS NEEDED FOR SHORTNESS OF BREATH OR WHEEZING Patient taking differently: Inhale 2 puffs into the lungs every 3 (three) hours as needed for wheezing or shortness of breath. 02/02/23   Jude Harden GAILS, MD  ALPRAZolam  (XANAX ) 1 MG tablet TAKE 1 TABLET(1 MG) BY MOUTH THREE TIMES DAILY Patient taking differently: Take 1 mg by mouth 2 (two) times daily as needed for anxiety or sleep. 06/18/23   Nafziger, Darleene, NP  arformoterol  (BROVANA ) 15 MCG/2ML NEBU Inhale 2 mLs (15 mcg total) by nebulization 2 (two) times daily. 11/20/23 03/05/24  Trixie Nilda HERO, MD  aspirin  EC 81 MG EC tablet Take 1 tablet (81 mg total) by mouth daily. Patient taking differently: Take 81 mg by mouth every Monday, Wednesday, and Friday. 07/08/15   Samtani, Jai-Gurmukh, MD  budesonide  (PULMICORT ) 0.25 MG/2ML nebulizer solution Take 2 mLs (0.25 mg total) by nebulization 2 (two) times daily. Patient taking differently: Take 0.25 mg by nebulization 2  (two) times daily as needed (shortness of breath). 11/20/23 03/05/24  Gherghe, Costin M, MD  butalbital -acetaminophen -caffeine  (FIORICET ) 50-325-40 MG tablet TAKE 1 TABLET BY MOUTH EVERY 4 HOURS AS NEEDED FOR HEADACHE 11/25/23   Nafziger, Darleene, NP  cetirizine (ZYRTEC) 10 MG tablet Take 10 mg by mouth in the morning.    [provider]  Cholecalciferol  (VITAMIN D3) 125 MCG (5000 UT) CAPS Take 5,000 Units by mouth at bedtime.    [provider]  Cyanocobalamin  (VITAMIN B-12) 5000 MCG SUBL Place 5,000 mcg under the tongue at bedtime.    [provider]  furosemide  (LASIX ) 40 MG tablet Take 1 tablet (40 mg total) by mouth at bedtime. 08/14/23   Ghimire, Donalda HERO, MD  guaiFENesin -dextromethorphan  (ROBITUSSIN DM) 100-10 MG/5ML syrup Take 5 mLs by mouth every 4 (four) hours as needed for cough (chest congestion). 01/29/24   Leotis Bogus, MD  HYDROcodone -acetaminophen  (NORCO) 10-325 MG tablet Take 1 tablet by mouth every 6 (six) hours as needed for moderate pain (pain score 4-6) or severe pain (pain score 7-10). 11/09/23   [provider]  hydrocortisone  cream 1 % Apply 1 Application topically daily as needed for itching. 07/15/23   [provider]  ipratropium-albuterol  (DUONEB) 0.5-2.5 (3) MG/3ML SOLN Take 3 mLs by nebulization every 4 (four) hours as needed. Patient taking differently: Take 3 mLs by nebulization in the morning, at noon, and at bedtime. 10/15/22 11/16/23  Amin, Ankit C, MD  LORazepam  (ATIVAN ) 1 MG  tablet Take 1 mg by mouth every 4 (four) hours as needed for anxiety. 12/02/23   [provider]  magnesium  oxide (MAG-OX) 400 (240 Mg) MG tablet Take 1 tablet by mouth daily. 01/04/24   [provider]  meclizine  (ANTIVERT ) 25 MG tablet Take 25 mg by mouth every 8 (eight) hours as needed for dizziness. 11/09/23   [provider]  Morphine  Sulfate (MORPHINE  CONCENTRATE) 10 mg / 0.5 ml concentrated solution Take 0.5 mLs by mouth as needed  for severe pain (pain score 7-10).    [provider]  MUCINEX  MAXIMUM STRENGTH 1200 MG TB12 Take 1,200 mg by mouth every 12 (twelve) hours.    [provider]  nitroGLYCERIN  (NITROSTAT ) 0.4 MG SL tablet Place 1 tablet (0.4 mg total) under the tongue every 5 (five) minutes as needed for chest pain. 10/24/20   Verlin Lonni BIRCH, MD  nystatin  (MYCOSTATIN ) 100000 UNIT/ML suspension Take 5 mLs (500,000 Units total) by mouth 4 (four) times daily. Patient taking differently: Take 5 mLs by mouth 4 (four) times daily as needed (for thrush). 11/04/22   Nafziger, Darleene, NP  OXYGEN Inhale 2.5-3.5 L/min into the lungs continuous.    [provider]  PARoxetine  (PAXIL ) 40 MG tablet Take 0.5 tablets (20 mg total) by mouth in the morning. TAKE 1 TABLET(40 MG) BY MOUTH EVERY MORNING Patient taking differently: Take 40 mg by mouth in the morning. 04/30/23   Nafziger, Darleene, NP  promethazine -dextromethorphan  (PROMETHAZINE -DM) 6.25-15 MG/5ML syrup Take 5 mLs by mouth every 6 (six) hours as needed for cough. 01/19/24   [provider]  rosuvastatin  (CRESTOR ) 20 MG tablet Take 1 tablet (20 mg total) by mouth daily. Patient taking differently: Take 20 mg by mouth at bedtime. 08/05/23   Verlin Lonni BIRCH, MD  SENOKOT 8.6 MG tablet Take 1 tablet by mouth 2 (two) times daily as needed for constipation.    [provider]    Allergies: Tape, Daliresp  [roflumilast ], Penicillins, Cozaar  [losartan ], Dupixent  [dupilumab ], Latex, Omnicef  [cefdinir ], Statins, Torsemide , Trazodone  and nefazodone, and Zestril  [lisinopril ]    Review of Systems  Constitutional:  Negative for chills and fever.  HENT:  Negative for sore throat.   Respiratory:  Positive for cough, shortness of breath and wheezing.   Cardiovascular:  Negative for chest pain and leg swelling.  Neurological:  Negative for light-headedness.  All other systems reviewed and are negative.   Updated Vital Signs BP  124/66   Pulse 95   Resp 20   SpO2 97%   Physical Exam Vitals and nursing note reviewed.  Constitutional:      General: She is not in acute distress.    Appearance: Normal appearance. She is not ill-appearing.  HENT:     Head: Normocephalic and atraumatic.     Nose: Nose normal.  Eyes:     Conjunctiva/sclera: Conjunctivae normal.  Cardiovascular:     Rate and Rhythm: Normal rate and regular rhythm.  Pulmonary:     Effort: Pulmonary effort is normal. No respiratory distress.     Breath sounds: Wheezing present.     Comments: Faint wheezing but also diminished lung sounds all throughout. Musculoskeletal:        General: No deformity. Normal range of motion.     Cervical back: Normal range of motion.     Right lower leg: No edema.     Left lower leg: No edema.  Skin:    Findings: No rash.  Neurological:     Mental Status: She  is alert.     (all labs ordered are listed, but only abnormal results are displayed) Labs Reviewed  BRAIN NATRIURETIC PEPTIDE - Abnormal; Notable for the following components:      Result Value   B Natriuretic Peptide 486.1 (*)    All other components within normal limits  CBC WITH DIFFERENTIAL/PLATELET - Abnormal; Notable for the following components:   WBC 19.3 (*)    Hemoglobin 11.8 (*)    RDW 15.7 (*)    Neutro Abs 12.4 (*)    Lymphs Abs 5.3 (*)    Monocytes Absolute 1.4 (*)    Abs Immature Granulocytes 0.13 (*)    All other components within normal limits  COMPREHENSIVE METABOLIC PANEL WITH GFR - Abnormal; Notable for the following components:   Glucose, Bld 111 (*)    Creatinine, Ser 1.36 (*)    Calcium  8.7 (*)    Total Protein 6.1 (*)    AST 44 (*)    GFR, Estimated 42 (*)    All other components within normal limits  MAGNESIUM  - Abnormal; Notable for the following components:   Magnesium  2.5 (*)    All other components within normal limits  BLOOD GAS, VENOUS - Abnormal; Notable for the following components:   pCO2, Ven 63 (*)     Bicarbonate 31.0 (*)    Acid-Base Excess 3.2 (*)    All other components within normal limits  TROPONIN I (HIGH SENSITIVITY) - Abnormal; Notable for the following components:   Troponin I (High Sensitivity) 35 (*)    All other components within normal limits  RESP PANEL BY RT-PCR (RSV, FLU A&B, COVID)  RVPGX2  HIV ANTIBODY (ROUTINE TESTING W REFLEX)  D-DIMER, QUANTITATIVE (NOT AT [facility])  TROPONIN I (HIGH SENSITIVITY)    EKG: EKG Interpretation Date/Time:  Saturday February 13 2024 18:12:52 EST Ventricular Rate:  110 PR Interval:  145 QRS Duration:  95 QT Interval:  334 QTC Calculation: 452 R Axis:   71  Text Interpretation: Sinus tachycardia Low voltage, precordial leads Baseline wander in lead(s) V3 V6 Since last tracing rate faster Confirmed by Patt Alm DEL [phone]) on 02/13/2024 6:16:31 PM  Radiology: [facility] Chest Port 1 View Result Date: 02/13/2024 EXAM: 1 VIEW(S) XRAY OF THE CHEST 02/13/2024 06:35:00 PM COMPARISON: 01/25/2024 CLINICAL HISTORY: dyspnea FINDINGS: LUNGS AND PLEURA: No focal pulmonary opacity. No pulmonary edema. No pleural effusion. No pneumothorax. HEART AND MEDIASTINUM: No acute abnormality of the cardiac and mediastinal silhouettes. Atherosclerotic plaque. BONES AND SOFT TISSUES: No acute osseous abnormality. IMPRESSION: 1. No acute process. Electronically signed by: Norman Gatlin MD 02/13/2024 06:50 PM EST RP Workstation: [id number]     .Critical Care  Performed by: Hildegard Loge, PA-C Authorized by: Hildegard Loge, PA-C   Critical care provider statement:    Critical care time (minutes):  30   Critical care was necessary to treat or prevent imminent or life-threatening deterioration of the following conditions:  Respiratory failure   Critical care was time spent personally by me on the following activities:  Development of treatment plan with patient or surrogate, discussions with consultants, evaluation of patient's response to treatment, examination of patient,  ordering and review of laboratory studies, ordering and review of radiographic studies, ordering and performing treatments and interventions, pulse oximetry, re-evaluation of patient's condition and review of old charts   Care discussed with: admitting provider      Medications Ordered in the ED  albuterol  (PROVENTIL ) (2.5 MG/3ML) 0.083% nebulizer solution 3 mL (has no administration in time  range)  ALPRAZolam  (XANAX ) tablet 1 mg (has no administration in time range)  arformoterol  (BROVANA ) nebulizer solution 15 mcg (has no administration in time range)  furosemide  (LASIX ) tablet 20 mg (has no administration in time range)  PARoxetine  (PAXIL ) tablet 40 mg (has no administration in time range)  rosuvastatin  (CRESTOR ) tablet 20 mg (has no administration in time range)  magnesium  sulfate IVPB 2 g 50 mL (0 g Intravenous Stopped 02/13/24 1949)  albuterol  (PROVENTIL ) (2.5 MG/3ML) 0.083% nebulizer solution 5 mg (5 mg Nebulization Given 02/13/24 1856)  ipratropium (ATROVENT ) nebulizer solution 0.5 mg (0.5 mg Nebulization Given 02/13/24 1857)    Clinical Course as of 02/13/24 2141  Sat Feb 13, 2024  2102 Discussed with hospitalist.  They will evaluate patient for admission. [AA]    Clinical Course User Index [AA] Hildegard Loge, PA-C                                 Medical Decision Making Amount and/or Complexity of Data Reviewed Labs: ordered. Radiology: ordered.  Risk Prescription drug management.   Medical Decision Making / ED Course   This patient presents to the ED for concern of Naasz of breath, this involves an extensive number of treatment options, and is a complaint that carries with it a high risk of complications and morbidity.  The differential diagnosis includes COPD exacerbation, CHF exacerbation, pneumonia, viral URI, PE  MDM: 68 year old female presents today for concern of shortness of breath with worsening cough of green sputum, wheezing.  History of COPD on supplemental  2.5-3.5 L of O2. Diminished lung sounds.  Faint wheezing heard.  Received 4 DuoNebs so far.  Will give an additional.  CBC shows leukocytosis likely reactive to steroids without significant left shift.  Troponin mildly elevated.  BNP of 486 but no signs of volume overload on chest x-ray or exam.  Low suspicion for CHF exacerbation, creatinine 1.36 otherwise without acute process on the CMP.  Respiratory panel negative.  VBG without acute process.  Chest x-ray without cardiopulmonary process.  EKG without acute ischemic change.  Still feeling short of breath after DuoNeb however work of breathing has improved. Discussed with Dr. Tobie who will evaluate patient for admission.  Lab Tests: -I ordered, reviewed, and interpreted labs.   The pertinent results include:   Labs Reviewed  BRAIN NATRIURETIC PEPTIDE - Abnormal; Notable for the following components:      Result Value   B Natriuretic Peptide 486.1 (*)    All other components within normal limits  CBC WITH DIFFERENTIAL/PLATELET - Abnormal; Notable for the following components:   WBC 19.3 (*)    Hemoglobin 11.8 (*)    RDW 15.7 (*)    Neutro Abs 12.4 (*)    Lymphs Abs 5.3 (*)    Monocytes Absolute 1.4 (*)    Abs Immature Granulocytes 0.13 (*)    All other components within normal limits  COMPREHENSIVE METABOLIC PANEL WITH GFR - Abnormal; Notable for the following components:   Glucose, Bld 111 (*)    Creatinine, Ser 1.36 (*)    Calcium  8.7 (*)    Total Protein 6.1 (*)    AST 44 (*)    GFR, Estimated 42 (*)    All other components within normal limits  MAGNESIUM  - Abnormal; Notable for the following components:   Magnesium  2.5 (*)    All other components within normal limits  BLOOD GAS, VENOUS - Abnormal; Notable  for the following components:   pCO2, Ven 63 (*)    Bicarbonate 31.0 (*)    Acid-Base Excess 3.2 (*)    All other components within normal limits  TROPONIN I (HIGH SENSITIVITY) - Abnormal; Notable for the following  components:   Troponin I (High Sensitivity) 35 (*)    All other components within normal limits  RESP PANEL BY RT-PCR (RSV, FLU A&B, COVID)  RVPGX2  HIV ANTIBODY (ROUTINE TESTING W REFLEX)  D-DIMER, QUANTITATIVE (NOT AT [facility])  TROPONIN I (HIGH SENSITIVITY)      EKG  EKG Interpretation Date/Time:  Saturday February 13 2024 18:12:52 EST Ventricular Rate:  110 PR Interval:  145 QRS Duration:  95 QT Interval:  334 QTC Calculation: 452 R Axis:   71  Text Interpretation: Sinus tachycardia Low voltage, precordial leads Baseline wander in lead(s) V3 V6 Since last tracing rate faster Confirmed by Patt Alm DEL [phone]) on 02/13/2024 6:16:31 PM         Imaging Studies ordered: I ordered imaging studies including cxr I independently visualized and interpreted imaging. I agree with the radiologist interpretation   Medicines ordered and prescription drug management: Meds ordered this encounter  Medications   magnesium  sulfate IVPB 2 g 50 mL   albuterol  (PROVENTIL ) (2.5 MG/3ML) 0.083% nebulizer solution 5 mg   ipratropium (ATROVENT ) nebulizer solution 0.5 mg   albuterol  (PROVENTIL ) (2.5 MG/3ML) 0.083% nebulizer solution 3 mL   ALPRAZolam  (XANAX ) tablet 1 mg   arformoterol  (BROVANA ) nebulizer solution 15 mcg   furosemide  (LASIX ) tablet 20 mg   PARoxetine  (PAXIL ) tablet 40 mg   rosuvastatin  (CRESTOR ) tablet 20 mg    -I have reviewed the patients home medicines and have made adjustments as needed  Critical interventions COPD exacerbation, multiple DuoNebs, IV magnesium    Cardiac Monitoring: The patient was maintained on a cardiac monitor.  I personally viewed and interpreted the cardiac monitored which showed an underlying rhythm of: Sinus tachycardia   Reevaluation: After the interventions noted above, I reevaluated the patient and found that they have :stayed the same  Co morbidities that complicate the patient evaluation  Past Medical History:  Diagnosis Date   C8  RADICULOPATHY 06/05/2009   Chronic kidney disease, stage 3a (HCC) 08/26/2021   COPD 08/31/2007   DEPRESSION 11/19/2006   DYSLIPIDEMIA 03/12/2009   MI (mitral incompetence)    NEPHROLITHIASIS 01/05/2008   OSTEOARTHRITIS 06/05/2009   PARESTHESIA 08/24/2007   TOBACCO ABUSE 01/25/2010      Dispostion: Discussed with hospitalist will evaluate patient for admission.  Final diagnoses:  COPD exacerbation [facility])    ED Discharge Orders     None          Hildegard Loge, PA-C 02/13/24 2206    Patt Alm Macho, MD 02/13/24 [phone]

## 2024-02-13 NOTE — Hospital Course (Signed)
 SABRA

## 2024-02-13 NOTE — ED Triage Notes (Signed)
 Pt BIB [facility] from home for SOB r/t PNA that has been tx.   COPD exacerbation x 2 days worsening the last couple of hrs. Expiratory wheezing and diminished breath sounds on scene.    2 Duonebs at home.  On 2nd Duoneb from EMS on arrival to ED.   125 Solumedrol  20G L. Hand  164/72 HR 110 ST, RR 24, 92% on Duoneb at home.  96% on Duonebs CBG 108

## 2024-02-13 NOTE — H&P (Addendum)
 History and Physical    Patient: Kim [name] FMW:[id number] DOB: 01-26-1956 DOA: 02/13/2024 DOS: the patient was seen and examined on 02/13/2024 . PCP: Merna Huxley, NP  Patient coming from: Home Chief complaint: SOB  HPI:  Kim [name] is a 68 y.o. female with past medical history  of chronic respiratory failure on chronic steroids, chronic combined systolic/diastolic CHF, Takotsubo cardiomyopathy, anxiety, and CKD 3B presents with shortness of breath and cough, worse than previously.  Patient was recently admitted on October 20 and discharged on October 24 after being treated for COPD exacerbation as well.    ED Course:  Vital signs in the ED were notable for the following: currently on 3L . Vitals:   02/13/24 1934 02/13/24 1935 02/13/24 2222 02/13/24 2227  BP: 124/66   124/66  Pulse:  95  84  Temp:    97.6 F (36.4 C)  Resp: 15 20  20   Height:   5' 1 (1.549 m)   Weight:   72.2 kg   SpO2:  97%  100%  TempSrc:    Oral  BMI (Calculated):   30.09   >>ED evaluation thus far shows: Show troponin of 35 repeat at 93, EKG sinus tach at 110 PR 145 QTc 452 wandering baseline and multiple artifacts Q waves in leads V1.  Comparison to previous EKG on October 20 low-voltage diffusely heart rate 96 PR 145 QTc 462. Initial venous blood gas showing pCO2 of 63 pH of 7.3 pCO2 of 34. Initial CMP showed glucose of 111 creatinine 1.36 eGFR 42, magnesium  2.5, AST of 45. Initial BNP of 46.1, troponin of 35 and repeat troponin at 93. Initial CBC shows white count of 19.3 hemoglobin of 11.8 platelets of 305. Respiratory panel negative for flu RSV and COVID.   >>While in the ED patient received the following: Medications  magnesium  sulfate IVPB 2 g 50 mL (0 g Intravenous Stopped 02/13/24 1949)  albuterol  (PROVENTIL ) (2.5 MG/3ML) 0.083% nebulizer solution 5 mg (5 mg Nebulization Given 02/13/24 1856)  ipratropium (ATROVENT ) nebulizer solution 0.5 mg (0.5 mg Nebulization Given 02/13/24  1857)   Review of Systems  Respiratory:  Positive for shortness of breath and wheezing.    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/2008   OSTEOARTHRITIS 06/05/2009   PARESTHESIA 08/24/2007   TOBACCO ABUSE 01/25/2010   Past Surgical History:  Procedure Laterality Date   ABDOMINAL HYSTERECTOMY     APPENDECTOMY  1969   CARDIAC CATHETERIZATION N/A 07/05/2015   Procedure: Left Heart Cath and Coronary Angiography;  Surgeon: Alm LELON Clay, MD;  Location: [facility] INVASIVE CV LAB;  Service: Cardiovascular;  Laterality: N/A;   CHOLECYSTECTOMY  1989   collapse lung  1990   COLONOSCOPY WITH PROPOFOL  N/A 05/14/2018   Procedure: COLONOSCOPY WITH PROPOFOL ;  Surgeon: Abran Norleen SAILOR, MD;  Location: [facility] ENDOSCOPY;  Service: Endoscopy;  Laterality: N/A;   EXCISION MASS ABDOMINAL  2019   EXPLORATORY LAPAROTOMY  1969   LEFT HEART CATH AND CORONARY ANGIOGRAPHY N/A 11/19/2020   Procedure: LEFT HEART CATH AND CORONARY ANGIOGRAPHY;  Surgeon: Mady Bruckner, MD;  Location: [facility] INVASIVE CV LAB;  Service: Cardiovascular;  Laterality: N/A;   POLYPECTOMY  05/14/2018   Procedure: POLYPECTOMY;  Surgeon: Abran Norleen SAILOR, MD;  Location: [facility] ENDOSCOPY;  Service: Endoscopy;;   SALPINGOOPHORECTOMY  2019    reports that she quit smoking about 9 years ago. Her smoking use included cigarettes. She started smoking about 47 years ago. She has a 76 pack-year smoking history. She has never used smokeless tobacco. She reports that she does not drink alcohol  and does not use drugs. Allergies  Allergen Reactions   Tape Other (See Comments)    Skin tears easily, peels skin off   Daliresp  [Roflumilast ] Other (See Comments)    Skin peeling   Penicillins Hives   Cozaar  [Losartan ] Other (See Comments)    Possible cause of severe  hypotension   Dupixent  [Dupilumab ] Diarrhea, Swelling, Other (See Comments) and Cough    Facial swelling Headache   Latex Other (See Comments)    Peels skin off   Omnicef  [Cefdinir ] Rash   Statins Other (See Comments)    Myalgia    Torsemide  Other (See Comments)    Severe stomach cramps and all over   Trazodone  And Nefazodone Other (See Comments)    Patient stated the last 2 times she took this medication gave her very bad migraine so she cut them out completely.   Zestril  [Lisinopril ] Cough   Family History  Problem Relation Age of Onset   Cancer Mother        lung   Heart attack Mother    Hypertension Sister    Multiple sclerosis Brother    Diabetes Sister    Rheum arthritis Sister    Thyroid  disease Sister    Multiple sclerosis Other    Alcohol  abuse Other    Arthritis Other    Diabetes Other    Kidney disease Other    Cancer Other        lung,ovarian,skin, uterine   Stroke Other    Heart disease Other    Melanoma Other    Osteoporosis Other    Heart attack Maternal Uncle    Heart attack Other        NEICE   Heart attack Other        NEPHEW   Hypertension Brother    Stroke Maternal Grandmother    Colon cancer Neg Hx    Prior to Admission medications   Medication Sig Start Date End Date Taking? Authorizing Provider  albuterol  (VENTOLIN  HFA) 108 (90 Base) MCG/ACT inhaler INHALE 2 PUFFS INTO THE LUNGS EVERY 4 TO 6 HOURS AS NEEDED FOR SHORTNESS OF BREATH OR WHEEZING Patient taking differently: Inhale 2 puffs into the lungs every 3 (three) hours as needed for wheezing or shortness of breath. 02/02/23   Jude Harden GAILS, MD  ALPRAZolam  (XANAX ) 1 MG tablet TAKE 1 TABLET(1 MG) BY MOUTH THREE TIMES DAILY Patient taking differently: Take 1 mg by mouth 2 (two) times daily as needed for anxiety or sleep. 06/18/23   Nafziger, Darleene, NP  arformoterol  (BROVANA ) 15 MCG/2ML NEBU Inhale 2 mLs (15 mcg total) by nebulization 2 (two) times daily. 11/20/23 03/05/24  Trixie Nilda HERO, MD   aspirin  EC 81 MG EC tablet Take 1 tablet (81 mg total) by mouth daily. Patient taking differently: Take 81 mg by mouth every Monday, Wednesday, and Friday. 07/08/15   Samtani, Jai-Gurmukh, MD  budesonide  (PULMICORT ) 0.25 MG/2ML nebulizer solution Take 2 mLs (0.25 mg total) by nebulization 2 (two) times daily. Patient taking differently: Take 0.25 mg by nebulization 2 (two) times daily as needed (shortness of breath). 11/20/23 03/05/24  Gherghe, Costin M, MD  butalbital -acetaminophen -caffeine  (FIORICET ) 50-325-40 MG tablet TAKE 1 TABLET BY MOUTH EVERY 4 HOURS AS NEEDED  FOR HEADACHE 11/25/23   Nafziger, Darleene, NP  cetirizine (ZYRTEC) 10 MG tablet Take 10 mg by mouth in the morning.    [provider]  Cholecalciferol  (VITAMIN D3) 125 MCG (5000 UT) CAPS Take 5,000 Units by mouth at bedtime.    [provider]  Cyanocobalamin  (VITAMIN B-12) 5000 MCG SUBL Place 5,000 mcg under the tongue at bedtime.    [provider]  furosemide  (LASIX ) 40 MG tablet Take 1 tablet (40 mg total) by mouth at bedtime. 08/14/23   Ghimire, Donalda HERO, MD  guaiFENesin -dextromethorphan  (ROBITUSSIN DM) 100-10 MG/5ML syrup Take 5 mLs by mouth every 4 (four) hours as needed for cough (chest congestion). 01/29/24   Leotis Bogus, MD  HYDROcodone -acetaminophen  (NORCO) 10-325 MG tablet Take 1 tablet by mouth every 6 (six) hours as needed for moderate pain (pain score 4-6) or severe pain (pain score 7-10). 11/09/23   [provider]  hydrocortisone  cream 1 % Apply 1 Application topically daily as needed for itching. 07/15/23   [provider]  ipratropium-albuterol  (DUONEB) 0.5-2.5 (3) MG/3ML SOLN Take 3 mLs by nebulization every 4 (four) hours as needed. Patient taking differently: Take 3 mLs by nebulization in the morning, at noon, and at bedtime. 10/15/22 11/16/23  Amin, Ankit C, MD  LORazepam  (ATIVAN ) 1 MG tablet Take 1 mg by mouth every 4 (four) hours as needed for anxiety. 12/02/23   [provider]  magnesium  oxide (MAG-OX) 400 (240 Mg) MG tablet Take 1 tablet by mouth daily. 01/04/24   [provider]  meclizine  (ANTIVERT ) 25 MG tablet Take 25 mg by mouth every 8 (eight) hours as needed for dizziness. 11/09/23   [provider]  Morphine  Sulfate (MORPHINE  CONCENTRATE) 10 mg / 0.5 ml concentrated solution Take 0.5 mLs by mouth as needed for severe pain (pain score 7-10).    [provider]  MUCINEX  MAXIMUM STRENGTH 1200 MG TB12 Take 1,200 mg by mouth every 12 (twelve) hours.    [provider]  nitroGLYCERIN  (NITROSTAT ) 0.4 MG SL tablet Place 1 tablet (0.4 mg total) under the tongue every 5 (five) minutes as needed for chest pain. 10/24/20   Verlin Lonni BIRCH, MD  nystatin  (MYCOSTATIN ) 100000 UNIT/ML suspension Take 5 mLs (500,000 Units total) by mouth 4 (four) times daily. Patient taking differently: Take 5 mLs by mouth 4 (four) times daily as needed (for thrush). 11/04/22   Nafziger, Darleene, NP  OXYGEN Inhale 2.5-3.5 L/min into the lungs continuous.    [provider]  PARoxetine  (PAXIL ) 40 MG tablet Take 0.5 tablets (20 mg total) by mouth in the morning. TAKE 1 TABLET(40 MG) BY MOUTH EVERY MORNING Patient taking differently: Take 40 mg by mouth in the morning. 04/30/23   Nafziger, Darleene, NP  promethazine -dextromethorphan  (PROMETHAZINE -DM) 6.25-15 MG/5ML syrup Take 5 mLs by mouth every 6 (six) hours as needed for cough. 01/19/24   [provider]  rosuvastatin  (CRESTOR ) 20 MG tablet Take 1 tablet (20 mg total) by mouth daily. Patient taking differently: Take 20 mg by mouth at bedtime. 08/05/23   Verlin Lonni BIRCH, MD  SENOKOT 8.6 MG tablet Take 1 tablet by mouth 2 (two) times daily as needed for constipation.    [provider]  Vitals:   02/13/24 1934 02/13/24 1935 02/13/24 2222 02/13/24 2227  BP: 124/66   124/66  Pulse:  95  84   Resp: 15 20  20   Temp:    97.6 F (36.4 C)  TempSrc:    Oral  SpO2:  97%  100%  Weight:   72.2 kg   Height:   5' 1 (1.549 m)    Physical Exam Vitals reviewed.  Constitutional:      General: She is not in acute distress.    Appearance: She is obese. She is ill-appearing.  HENT:     Head: Normocephalic and atraumatic.  Eyes:     Extraocular Movements: Extraocular movements intact.  Cardiovascular:     Rate and Rhythm: Normal rate and regular rhythm.     Heart sounds: Normal heart sounds.  Pulmonary:     Effort: Pulmonary effort is normal.     Breath sounds: Wheezing present.  Abdominal:     General: There is no distension.     Palpations: Abdomen is soft.     Tenderness: There is no abdominal tenderness.  Musculoskeletal:     Right lower leg: No edema.     Left lower leg: No edema.  Neurological:     General: No focal deficit present.     Mental Status: She is alert and oriented to person, place, and time.     Labs on Admission: I have personally reviewed following labs and imaging studies CBC: Recent Labs  Lab 02/13/24 1816  WBC 19.3*  NEUTROABS 12.4*  HGB 11.8*  HCT 38.7  MCV 97.7  PLT 305   Basic Metabolic Panel: Recent Labs  Lab 02/13/24 1816  NA 145  K 4.5  CL 107  CO2 24  GLUCOSE 111*  BUN 18  CREATININE 1.36*  CALCIUM  8.7*  MG 2.5*   GFR: Estimated Creatinine Clearance: 35 mL/min (A) (by C-G formula based on SCr of 1.36 mg/dL (H)). Liver Function Tests: Recent Labs  Lab 02/13/24 1816  AST 44*  ALT 35  ALKPHOS 70  BILITOT 0.6  PROT 6.1*  ALBUMIN 3.6   No results for input(s): LIPASE, AMYLASE in the last 168 hours. No results for input(s): AMMONIA in the last 168 hours. Recent Labs    08/11/23 0436 08/13/23 0518 11/16/23 1004 11/17/23 0255 11/18/23 0242 11/19/23 0320 01/25/24 1037 01/26/24 0532 01/28/24 0244 02/13/24 1816  BUN 35* 28* 14 19 25* 27* 22 26* 24* 18  CREATININE 1.27* 1.22* 1.31* 1.36* 1.36* 1.41* 1.47*  1.43* 1.19* 1.36*    Cardiac Enzymes: No results for input(s): CKTOTAL, CKMB, CKMBINDEX, TROPONINI in the last 168 hours. BNP (last 3 results) Recent Labs    04/23/23 1547  PROBNP 337*   HbA1C: No results for input(s): HGBA1C in the last 72 hours. CBG: No results for input(s): GLUCAP in the last 168 hours. Lipid Profile: No results for input(s): CHOL, HDL, LDLCALC, TRIG, CHOLHDL, LDLDIRECT in the last 72 hours. Thyroid  Function Tests: No results for input(s): TSH, T4TOTAL, FREET4, T3FREE, THYROIDAB in the last 72 hours. Anemia Panel: No results for input(s): VITAMINB12, FOLATE, FERRITIN, TIBC, IRON, RETICCTPCT in the last 72 hours. Urine analysis:    Component Value Date/Time   COLORURINE YELLOW 01/25/2024 2347   APPEARANCEUR CLEAR 01/25/2024 2347   LABSPEC 1.015 01/25/2024 2347   PHURINE 5.0 01/25/2024 2347   GLUCOSEU 150 (A) 01/25/2024 2347   HGBUR NEGATIVE 01/25/2024 2347   HGBUR trace-lysed 02/26/2010 0951   BILIRUBINUR NEGATIVE 01/25/2024 2347  BILIRUBINUR negative 04/29/2021 1243   KETONESUR NEGATIVE 01/25/2024 2347   PROTEINUR NEGATIVE 01/25/2024 2347   UROBILINOGEN 0.2 04/29/2021 1243   UROBILINOGEN 0.2 02/26/2010 0951   NITRITE NEGATIVE 01/25/2024 2347   LEUKOCYTESUR TRACE (A) 01/25/2024 2347   Radiological Exams on Admission: DG Chest Port 1 View Result Date: 02/13/2024 EXAM: 1 VIEW(S) XRAY OF THE CHEST 02/13/2024 06:35:00 PM COMPARISON: 01/25/2024 CLINICAL HISTORY: dyspnea FINDINGS: LUNGS AND PLEURA: No focal pulmonary opacity. No pulmonary edema. No pleural effusion. No pneumothorax. HEART AND MEDIASTINUM: No acute abnormality of the cardiac and mediastinal silhouettes. Atherosclerotic plaque. BONES AND SOFT TISSUES: No acute osseous abnormality. IMPRESSION: 1. No acute process. Electronically signed by: Norman Gatlin MD 02/13/2024 06:50 PM EST RP Workstation: [id number]   Data Reviewed: Relevant notes from  primary care and specialist visits, past discharge summaries as available in EHR, including Care Everywhere . Prior diagnostic testing as pertinent to current admission diagnoses, Updated medications and problem lists for reconciliation .ED course, including vitals, labs, imaging, treatment and response to treatment,Triage notes, nursing and pharmacy notes and ED provider's notes.Notable results as noted in HPI.Discussed case with EDMD/ ED APP/ or Specialty MD on call and as needed.  Assessment & Plan    >> Shortness of breath/COPD exacerbation: Patient presenting with shortness of breath and cough with no increase in need of oxygen requirement is currently on 3 L and baseline to 2.5 at home. Will continue patient on DuoNebs, albuterol , continue home PTA meds Brovana  and budesonide  along with Solu-Medrol  40 mg IV twice daily. Incentive spirometer and chest PT as needed. I will however obtain a VQ scan to rule out VTE.  >> Elevated troponin: Rising troponin from 35-93, have sent a message to cardiology on-call for consult and will continue to trend, will start patient on heparin  gtt if troponin continues to rise.  Appreciate cardiology consult. We will obtain echo and wait for 3rd TNI. VTE eval is ordered and pending.   >> Leukocytosis: Secondary to patient's chronic steroid therapy.  Do not suspect infection.   >> Chronic diastolic congestive heart failure: Mildly elevated BNP, continue home regimen of Lasix  strict I's and O's sodium restricted diet.   >> CKD stage IIIb: Lab Results  Component Value Date   CREATININE 1.36 (H) 02/13/2024   CREATININE 1.19 (H) 01/28/2024   CREATININE 1.43 (H) 01/26/2024  Stable will avoid contrast and renally dose needed medications.   >> OSA: CPAP per home settings.   DVT prophylaxis:  Heparin . Consults:  None  Advance Care Planning:  DNR/DNI.  Family Communication:  None Disposition Plan:  Home Severity of Illness: The appropriate  patient status for this patient is OBSERVATION. Observation status is judged to be reasonable and necessary in order to provide the required intensity of service to ensure the patient's safety. The patient's presenting symptoms, physical exam findings, and initial radiographic and laboratory data in the context of their medical condition is felt to place them at decreased risk for further clinical deterioration. Furthermore, it is anticipated that the patient will be medically stable for discharge from the hospital within 2 midnights of admission.   Unresulted Labs (From admission, onward)     Start     Ordered   02/14/24 0500  CBC with Differential/Platelet  Tomorrow morning,   R        02/13/24 2313   02/14/24 0500  Comprehensive metabolic panel with GFR  Tomorrow morning,   R        02/13/24 2313  02/14/24 0500  Magnesium   Tomorrow morning,   R        02/13/24 2313   02/13/24 2323  D-dimer, quantitative  Once,   AD        02/13/24 2323            Meds ordered this encounter  Medications   magnesium  sulfate IVPB 2 g 50 mL   albuterol  (PROVENTIL ) (2.5 MG/3ML) 0.083% nebulizer solution 5 mg   ipratropium (ATROVENT ) nebulizer solution 0.5 mg   albuterol  (PROVENTIL ) (2.5 MG/3ML) 0.083% nebulizer solution 3 mL   ALPRAZolam  (XANAX ) tablet 1 mg   arformoterol  (BROVANA ) nebulizer solution 15 mcg   furosemide  (LASIX ) tablet 20 mg   PARoxetine  (PAXIL ) tablet 40 mg   rosuvastatin  (CRESTOR ) tablet 20 mg   budesonide  (PULMICORT ) nebulizer solution 0.25 mg   methylPREDNISolone  sodium succinate (SOLU-MEDROL ) 40 mg/mL injection 40 mg   morphine  (PF) 2 MG/ML injection 1 mg   nitroGLYCERIN  (NITROSTAT ) SL tablet 0.4 mg     Orders Placed This Encounter  Procedures   Critical Care   Resp panel by RT-PCR (RSV, Flu A&B, Covid) Anterior Nasal Swab   DG Chest Port 1 View   Brain natriuretic peptide   CBC with Differential/Platelet   Comprehensive metabolic panel with GFR   Magnesium    Blood  gas, venous   HIV Antibody (routine testing w rflx)   CBC with Differential/Platelet   Comprehensive metabolic panel with GFR   Magnesium    D-dimer, quantitative   Diet NPO time specified Except for: Ice Chips, Sips with Meds   ED Cardiac monitoring   Intake and Output   Apply Chronic Obstructive Pulmonary Disease Care Plan   Elevate head of bed   If diabetic or glucose greater than 140 notify MD to place Glycemic Control Order Set   Maintain IV access   Turn cough deep breathe   Assess   Check Pulse Oximetry while ambulating   RN may order General Admission PRN Orders utilizing General Admission PRN medications (through manage orders) for the following patient needs: allergy symptoms (Claritin ), cold sores (Carmex), cough (Robitussin DM), eye irritation (Liquifilm Tears), hemorrhoids (Tucks), indigestion (Maalox), minor skin irritation (Hydrocortisone  Cream), muscle pain Lucienne Gay), nose irritation (saline nasal spray) and sore throat (Chloraseptic spray).   Cardiac Monitoring Continuous x 48 hours Indications for use: Other; Other indications for use: COPD/ SOB   Consult to hospitalist   Consult to respiratory care treatment   Consult to Registered Dietitian   Airborne and Contact precautions   Oxygen therapy Mode or (Route): Nasal cannula; Liters Per Minute: 2; Keep O2 saturation between: greater than 92 %   Incentive spirometry RT   Incentive spirometry RT   Chest physiotherapy   EKG 12-Lead   12 lead EKG   Place in observation (patient's expected length of stay will be less than 2 midnights)    Author: Mario LULLA Blanch, MD 12 pm- 8 pm. [facility]. 02/13/2024 11:55 PM Please note for any communication after hours contact [facility] Assigned provider on call on Amion.

## 2024-02-14 ENCOUNTER — Observation Stay (HOSPITAL_COMMUNITY)

## 2024-02-14 DIAGNOSIS — I3139 Other pericardial effusion (noninflammatory): Secondary | ICD-10-CM | POA: Diagnosis present

## 2024-02-14 DIAGNOSIS — I5043 Acute on chronic combined systolic (congestive) and diastolic (congestive) heart failure: Secondary | ICD-10-CM | POA: Diagnosis not present

## 2024-02-14 DIAGNOSIS — G4733 Obstructive sleep apnea (adult) (pediatric): Secondary | ICD-10-CM | POA: Diagnosis present

## 2024-02-14 DIAGNOSIS — I130 Hypertensive heart and chronic kidney disease with heart failure and stage 1 through stage 4 chronic kidney disease, or unspecified chronic kidney disease: Secondary | ICD-10-CM | POA: Diagnosis present

## 2024-02-14 DIAGNOSIS — I2510 Atherosclerotic heart disease of native coronary artery without angina pectoris: Secondary | ICD-10-CM | POA: Diagnosis present

## 2024-02-14 DIAGNOSIS — J441 Chronic obstructive pulmonary disease with (acute) exacerbation: Secondary | ICD-10-CM | POA: Diagnosis present

## 2024-02-14 DIAGNOSIS — J9621 Acute and chronic respiratory failure with hypoxia: Secondary | ICD-10-CM | POA: Diagnosis present

## 2024-02-14 DIAGNOSIS — R0609 Other forms of dyspnea: Secondary | ICD-10-CM

## 2024-02-14 DIAGNOSIS — Z87891 Personal history of nicotine dependence: Secondary | ICD-10-CM | POA: Diagnosis not present

## 2024-02-14 DIAGNOSIS — Z515 Encounter for palliative care: Secondary | ICD-10-CM | POA: Diagnosis not present

## 2024-02-14 DIAGNOSIS — F32A Depression, unspecified: Secondary | ICD-10-CM | POA: Diagnosis present

## 2024-02-14 DIAGNOSIS — I428 Other cardiomyopathies: Secondary | ICD-10-CM | POA: Diagnosis present

## 2024-02-14 DIAGNOSIS — I2489 Other forms of acute ischemic heart disease: Secondary | ICD-10-CM | POA: Diagnosis present

## 2024-02-14 DIAGNOSIS — Z7952 Long term (current) use of systemic steroids: Secondary | ICD-10-CM | POA: Diagnosis not present

## 2024-02-14 DIAGNOSIS — F410 Panic disorder [episodic paroxysmal anxiety] without agoraphobia: Secondary | ICD-10-CM | POA: Diagnosis not present

## 2024-02-14 DIAGNOSIS — I5A Non-ischemic myocardial injury (non-traumatic): Secondary | ICD-10-CM | POA: Diagnosis present

## 2024-02-14 DIAGNOSIS — E785 Hyperlipidemia, unspecified: Secondary | ICD-10-CM | POA: Diagnosis present

## 2024-02-14 DIAGNOSIS — Z66 Do not resuscitate: Secondary | ICD-10-CM | POA: Diagnosis present

## 2024-02-14 DIAGNOSIS — Z7982 Long term (current) use of aspirin: Secondary | ICD-10-CM | POA: Diagnosis not present

## 2024-02-14 DIAGNOSIS — N1832 Chronic kidney disease, stage 3b: Secondary | ICD-10-CM | POA: Diagnosis present

## 2024-02-14 DIAGNOSIS — R7989 Other specified abnormal findings of blood chemistry: Secondary | ICD-10-CM | POA: Diagnosis not present

## 2024-02-14 DIAGNOSIS — E66811 Obesity, class 1: Secondary | ICD-10-CM | POA: Diagnosis present

## 2024-02-14 DIAGNOSIS — Z7951 Long term (current) use of inhaled steroids: Secondary | ICD-10-CM | POA: Diagnosis not present

## 2024-02-14 DIAGNOSIS — Z833 Family history of diabetes mellitus: Secondary | ICD-10-CM | POA: Diagnosis not present

## 2024-02-14 DIAGNOSIS — D649 Anemia, unspecified: Secondary | ICD-10-CM | POA: Diagnosis present

## 2024-02-14 DIAGNOSIS — R0600 Dyspnea, unspecified: Secondary | ICD-10-CM | POA: Diagnosis not present

## 2024-02-14 DIAGNOSIS — Z8249 Family history of ischemic heart disease and other diseases of the circulatory system: Secondary | ICD-10-CM | POA: Diagnosis not present

## 2024-02-14 DIAGNOSIS — Z6831 Body mass index (BMI) 31.0-31.9, adult: Secondary | ICD-10-CM | POA: Diagnosis not present

## 2024-02-14 LAB — CBC WITH DIFFERENTIAL/PLATELET
Abs Immature Granulocytes: 0.1 K/uL — ABNORMAL HIGH (ref 0.00–0.07)
Basophils Absolute: 0 K/uL (ref 0.0–0.1)
Basophils Relative: 0 %
Eosinophils Absolute: 0 K/uL (ref 0.0–0.5)
Eosinophils Relative: 0 %
HCT: 36.1 % (ref 36.0–46.0)
Hemoglobin: 10.9 g/dL — ABNORMAL LOW (ref 12.0–15.0)
Immature Granulocytes: 1 %
Lymphocytes Relative: 2 %
Lymphs Abs: 0.3 K/uL — ABNORMAL LOW (ref 0.7–4.0)
MCH: 29.5 pg (ref 26.0–34.0)
MCHC: 30.2 g/dL (ref 30.0–36.0)
MCV: 97.8 fL (ref 80.0–100.0)
Monocytes Absolute: 0.2 K/uL (ref 0.1–1.0)
Monocytes Relative: 1 %
Neutro Abs: 14.7 K/uL — ABNORMAL HIGH (ref 1.7–7.7)
Neutrophils Relative %: 96 %
Platelets: 282 K/uL (ref 150–400)
RBC: 3.69 MIL/uL — ABNORMAL LOW (ref 3.87–5.11)
RDW: 15.9 % — ABNORMAL HIGH (ref 11.5–15.5)
WBC: 15.2 K/uL — ABNORMAL HIGH (ref 4.0–10.5)
nRBC: 0 % (ref 0.0–0.2)

## 2024-02-14 LAB — COMPREHENSIVE METABOLIC PANEL WITH GFR
ALT: 35 U/L (ref 0–44)
AST: 43 U/L — ABNORMAL HIGH (ref 15–41)
Albumin: 3.4 g/dL — ABNORMAL LOW (ref 3.5–5.0)
Alkaline Phosphatase: 68 U/L (ref 38–126)
Anion gap: 19 — ABNORMAL HIGH (ref 5–15)
BUN: 23 mg/dL (ref 8–23)
CO2: 26 mmol/L (ref 22–32)
Calcium: 8.8 mg/dL — ABNORMAL LOW (ref 8.9–10.3)
Chloride: 100 mmol/L (ref 98–111)
Creatinine, Ser: 1.57 mg/dL — ABNORMAL HIGH (ref 0.44–1.00)
GFR, Estimated: 35 mL/min — ABNORMAL LOW (ref >60)
Glucose, Bld: 224 mg/dL — ABNORMAL HIGH (ref 70–99)
Potassium: 4 mmol/L (ref 3.5–5.1)
Sodium: 145 mmol/L (ref 135–145)
Total Bilirubin: 0.5 mg/dL (ref 0.0–1.2)
Total Protein: 5.9 g/dL — ABNORMAL LOW (ref 6.5–8.1)

## 2024-02-14 LAB — BASIC METABOLIC PANEL WITH GFR
Anion gap: 13 (ref 5–15)
BUN: 24 mg/dL — ABNORMAL HIGH (ref 8–23)
CO2: 26 mmol/L (ref 22–32)
Calcium: 8.8 mg/dL — ABNORMAL LOW (ref 8.9–10.3)
Chloride: 105 mmol/L (ref 98–111)
Creatinine, Ser: 1.11 mg/dL — ABNORMAL HIGH (ref 0.44–1.00)
GFR, Estimated: 53 mL/min — ABNORMAL LOW (ref >60)
Glucose, Bld: 103 mg/dL — ABNORMAL HIGH (ref 70–99)
Potassium: 4.3 mmol/L (ref 3.5–5.1)
Sodium: 144 mmol/L (ref 135–145)

## 2024-02-14 LAB — TROPONIN I (HIGH SENSITIVITY)
Troponin I (High Sensitivity): 111 ng/L (ref <18)
Troponin I (High Sensitivity): 115 ng/L (ref <18)
Troponin I (High Sensitivity): 95 ng/L — ABNORMAL HIGH (ref <18)
Troponin I (High Sensitivity): 91 ng/L — ABNORMAL HIGH (ref <18)

## 2024-02-14 LAB — MAGNESIUM: Magnesium: 2.6 mg/dL — ABNORMAL HIGH (ref 1.7–2.4)

## 2024-02-14 LAB — ECHOCARDIOGRAM COMPLETE
AV Mean grad: 3 mmHg
AV Peak grad: 7.8 mmHg
Ao pk vel: 1.4 m/s
Area-P 1/2: 5.2 cm2
Height: 61.5 in
Weight: 2560.86 [oz_av]

## 2024-02-14 LAB — LACTIC ACID, PLASMA
Lactic Acid, Venous: 2.7 mmol/L (ref 0.5–1.9)
Lactic Acid, Venous: 2.6 mmol/L (ref 0.5–1.9)

## 2024-02-14 LAB — D-DIMER, QUANTITATIVE: D-Dimer, Quant: 0.57 ug{FEU}/mL — ABNORMAL HIGH (ref 0.00–0.50)

## 2024-02-14 MED ORDER — SODIUM CHLORIDE 0.9 % IV BOLUS
1000.0000 mL | Freq: Once | INTRAVENOUS | Status: AC
  Administered 2024-02-14: 1000 mL via INTRAVENOUS

## 2024-02-14 MED ORDER — METOPROLOL TARTRATE 25 MG PO TABS: 25.0000 mg | ORAL_TABLET | Freq: Two times a day (BID) | ORAL | Status: DC

## 2024-02-14 MED ORDER — BENZONATATE 100 MG PO CAPS
100.0000 mg | ORAL_CAPSULE | Freq: Three times a day (TID) | ORAL | Status: DC
  Administered 2024-02-14 – 2024-02-18 (×12): 100 mg via ORAL
  Filled 2024-02-14 (×12): qty 1

## 2024-02-14 MED ORDER — DEXTROMETHORPHAN POLISTIREX ER 30 MG/5ML PO SUER
30.0000 mg | Freq: Two times a day (BID) | ORAL | Status: DC
  Administered 2024-02-14 – 2024-02-15 (×3): 30 mg via ORAL
  Filled 2024-02-14 (×3): qty 5

## 2024-02-14 MED ORDER — METOPROLOL TARTRATE 25 MG PO TABS
25.0000 mg | ORAL_TABLET | Freq: Two times a day (BID) | ORAL | Status: DC
  Administered 2024-02-14 – 2024-02-18 (×8): 25 mg via ORAL
  Filled 2024-02-14 (×8): qty 1

## 2024-02-14 MED ORDER — DOXYCYCLINE HYCLATE 100 MG PO TABS
100.0000 mg | ORAL_TABLET | Freq: Two times a day (BID) | ORAL | Status: DC
  Administered 2024-02-14 – 2024-02-18 (×9): 100 mg via ORAL
  Filled 2024-02-14 (×9): qty 1

## 2024-02-14 MED ORDER — LORAZEPAM 0.5 MG PO TABS
0.5000 mg | ORAL_TABLET | ORAL | Status: DC | PRN
  Administered 2024-02-15 (×3): 0.5 mg via ORAL
  Filled 2024-02-14 (×4): qty 1

## 2024-02-14 MED ORDER — LABETALOL HCL 5 MG/ML IV SOLN
10.0000 mg | INTRAVENOUS | Status: DC | PRN
  Administered 2024-02-14: 10 mg via INTRAVENOUS
  Filled 2024-02-14: qty 4

## 2024-02-14 MED ORDER — ASPIRIN 81 MG PO TBEC
81.0000 mg | DELAYED_RELEASE_TABLET | Freq: Every day | ORAL | Status: DC
  Administered 2024-02-15 – 2024-02-18 (×4): 81 mg via ORAL
  Filled 2024-02-14 (×5): qty 1

## 2024-02-14 MED ORDER — LABETALOL HCL 5 MG/ML IV SOLN
5.0000 mg | Freq: Once | INTRAVENOUS | Status: AC
  Administered 2024-02-14: 5 mg via INTRAVENOUS

## 2024-02-14 MED ORDER — MORPHINE SULFATE (PF) 2 MG/ML IV SOLN: 2.0000 mg | INTRAVENOUS | Status: DC | PRN

## 2024-02-14 MED ORDER — LORAZEPAM 2 MG/ML IJ SOLN
0.5000 mg | INTRAMUSCULAR | Status: DC | PRN
  Administered 2024-02-14 – 2024-02-17 (×5): 0.5 mg via INTRAVENOUS
  Filled 2024-02-14 (×5): qty 1

## 2024-02-14 MED ORDER — GUAIFENESIN ER 600 MG PO TB12
600.0000 mg | ORAL_TABLET | Freq: Two times a day (BID) | ORAL | Status: DC
  Administered 2024-02-14 – 2024-02-18 (×9): 600 mg via ORAL
  Filled 2024-02-14 (×9): qty 1

## 2024-02-14 MED ORDER — LORAZEPAM 2 MG/ML IJ SOLN: 0.5000 mg | INTRAMUSCULAR | Status: DC | PRN

## 2024-02-14 MED ORDER — FENTANYL CITRATE (PF) 50 MCG/ML IJ SOSY
25.0000 ug | PREFILLED_SYRINGE | INTRAMUSCULAR | Status: DC | PRN
  Administered 2024-02-14 – 2024-02-15 (×6): 25 ug via INTRAVENOUS
  Filled 2024-02-14 (×7): qty 1

## 2024-02-14 MED ORDER — LABETALOL HCL 5 MG/ML IV SOLN
INTRAVENOUS | Status: AC
Start: 2024-02-14 — End: 2024-02-14
  Filled 2024-02-14: qty 4

## 2024-02-14 NOTE — ED Notes (Signed)
 Cardiologist leaving bedside.

## 2024-02-14 NOTE — ED Notes (Signed)
 Pt requesting re-eval on NPO status for breakfast this morning. Day shift will be notified for AM MD

## 2024-02-14 NOTE — Progress Notes (Signed)
 Received call from RN @ 7:00 pm. RN stated pt SpO2 was in low 80's. Pt had panic attack and removed mask. RN placed pt on 3L [place]. Pt wears 2 1/2 to 3 L @ home. Pt requested small Bipap mask. Masked changed. Pt placed on 4L with bubble humidifier. PT requested Pulmicort  and Brovana  Tx. TX was not scheduled to 10 pm charge to change time.

## 2024-02-14 NOTE — Progress Notes (Signed)
   02/14/24 1247  Vitals  Temp 98.1 F (36.7 C)  Temp Source Oral  BP (!) 184/125  MAP (mmHg) 139  BP Location Right Wrist  BP Method Automatic  Patient Position (if appropriate) Lying  Pulse Rate (!) 133  Pulse Rate Source Monitor  ECG Heart Rate (!) 135  Resp (!) 24  MEWS COLOR  MEWS Score Color Red  Oxygen Therapy  SpO2 94 %  O2 Device Nasal Cannula  MEWS Score  MEWS Temp 0  MEWS Systolic 0  MEWS Pulse 3  MEWS RR 1  MEWS LOC 0  MEWS Score 4  Provider Notification  Provider Name/Title Patel MD  Date Provider Notified 02/14/24  Time Provider Notified 1258  Method of Notification Page  Notification Reason Change in status  Provider response At bedside  Date of Provider Response 02/14/24  Time of Provider Response 1310  Rapid Response Notification  Name of Rapid Response RN Notified Nikki RN  Date Rapid Response Notified 02/14/24  Time Rapid Response Notified 1255   Pt. Received from ED became very anxious vital signs were as stated above. Rapid response, RT, and MD were notified all came to bedside. See Rapid Response RN note. MD ordered labetalol  5mg  and fentanyl  given IV. Ativan  was ordered but not given. MD assessed pt. At bedside. RT initiated BiPAP. Pt. Responded well to BiPAP and medications vital signs were in normal limits before rapid response left the room.

## 2024-02-14 NOTE — Significant Event (Signed)
 Rapid Response Event Note   Reason for Call :  SOB, tachycardia, hypertension  Initial Focused Assessment:  On arrival, pt sitting up in bed with increased WOB. Pt just arrived from ED per RN. Lung sounds diminished on Bipap. Pt A&O and denies pain. She received Xanax  prior for anxiety.   VS: HR 122, BP 204/111, RR 30, spO2 96% on Bipap 45%.   Primary team to bedside.   Interventions:  CXR Fent 25mcg 5 Labetalol  EKG  Plan of Care:  Continue to monitor pt resp status. RN instructed to call with any changes or concerns.    Event Summary:   MD Notified: PTA Call Time: 1300 Arrival Time: 1302 End Time: 1330  Nat Blunt, RN

## 2024-02-14 NOTE — Progress Notes (Signed)
 [facility] Progress [facility] Patient: Kim [name] FMW:[id number] DOB: [birth year]  DOA: 02/13/2024 DOS: the patient was seen and examined on 02/14/2024  Brief Hospital Course: PMH of chronic combined CHF, Takotsubo, CKD stage IIIb, anxiety, chronic respiratory failure, COPD present to the hospital because of cough and shortness of breath. Currently treated for COPD exacerbation. Assessment and Plan: Acute on chronic respiratory failure with hypoxia  COPD exacerbation Uses 3 LPM at home. Was at 12 PM at the time of my initial evaluation he required significant amount of oxygen due to respiratory distress later on. Currently on BiPAP. Monitor. Chest x-ray unremarkable. Initially admitted had concerns about possibility for PE although patient's symptoms does not appear to be consistent with PE. D-dimer is only minimally elevated. For now no further workup necessary. Will hold off on Lasix  given worsening renal function. Continue with COPD with steroids DuoNebs and antibiotic. Continue BiPAP.   Leukocytosis Acute on chronic.  White blood cell count elevated at 23.1.  Patient had been started on empiric antibiotics.  Suspect possibly reactive in nature.   Chronic diastolic CHF Patient appears to be fairly euvolemic at this time.  Last echocardiogram noted EF to be 50 to 55% with indeterminate diastolic parameters when checked 08/07/2023. -Strict I&Os and daily weights While history is adequate.  Holding diuretic for now.   Hyperglycemia Acute.  Glucose noted to be elevated up to 258.  Last available hemoglobin A1c noted to be 6.1 back in 07/2022.  Thought likely secondary to acute stress and steroids   Hypertension Blood pressures currently maintained. Continue current regimen   Normocytic anemia Hemoglobin noted to be 11.8 which appears to priors.  No reports of bleeding Continue to monitor   Chronic kidney disease stage IIIb Creatinine baseline appears to be around  1.41.5. Monitor. Mild worsening was seen requiring IV fluid.   Anxiety and depression Switch Xanax  to Ativan . Continue Paxil  at home dose.   Hyperlipidemia  Continue Crestor    OSA Continue BiPAP nightly after able to be weaned off  Subjective: Breathing still heavy but improving.  No nausea or vomiting later in the day had a panic attack leading to significant respiratory distress with hypoxia, tachycardia and tachypnea with hypertension.  Improved with medications.  Physical Exam: Occasional crackles and occasional wheezing. S1-S2 present.  Some bowel sounds present but Nontender. No edema.  Data Reviewed: I have Reviewed nursing notes, Vitals, and Lab results. Since last encounter, pertinent lab results CBC BMP   . I have ordered test including CBC BMP EKG  . I have ordered imaging chest x-ray  .   Disposition: Status is: Inpatient Remains inpatient appropriate because: Monitor for improvement respite distress  Family Communication: Family at bedside Level of care: Progressive   Vitals:   02/14/24 1300 02/14/24 1322 02/14/24 1454 02/14/24 1554  BP: (!) 204/111 135/65 (!) 114/52 120/61  Pulse: (!) 125 90 71 77  Resp: (!) 30 (!) 24 (!) 23 17  Temp:  98.1 F (36.7 C) 97.7 F (36.5 C) 98.4 F (36.9 C)  TempSrc:  Oral Oral Oral  SpO2: 98% 96% 98% 92%  Weight:      Height:         Author: Yetta Blanch, MD 02/14/2024 4:28 PM  Please look on [email or web address] to find out who is on call.

## 2024-02-14 NOTE — Progress Notes (Signed)
  Echocardiogram 2D Echocardiogram has been performed.  Tinnie FORBES Gosling RDCS 02/14/2024, 1:55 PM

## 2024-02-14 NOTE — Progress Notes (Addendum)
   02/14/24 1844  Vitals  BP (!) 204/85  MAP (mmHg) 119  BP Location Right Arm  BP Method Automatic  Patient Position (if appropriate) Lying  Pulse Rate Source Monitor  ECG Heart Rate (!) 113  Resp (!) 22  Level of Consciousness  Level of Consciousness Alert  MEWS COLOR  MEWS Score Color Red  Oxygen Therapy  SpO2 93 %  O2 Device Nasal Cannula  FiO2 (%) (!) 3 %  Pain Assessment  Pain Scale 0-10  Pain Score 9  Pain Location Back  Pain Intervention(s) Medication (See eMAR)  POSS Scale (Pasero Opioid Sedation Scale)  POSS *See Group Information* 1-Acceptable,Awake and alert  MEWS Score  MEWS Temp 0  MEWS Systolic 2  MEWS Pulse 2  MEWS RR 1  MEWS LOC 0  MEWS Score 5   Went to clean pt. After purewick leaked after just turning pt. Became severely anxious. PRN IV Ativan , fentanyl , and labetalol  was given. Stayed with pt. While she calmed down medications were effective. RT called to reassess need for BiPap. Vital signs returned to normal limits before Rns left the room.

## 2024-02-14 NOTE — Progress Notes (Signed)
   Consult note reviewed, patient evaluated peripherally this AM    Assessment & Plan  1.Elevated troponin - mild trop elevation peak 115 in setting of COPD exacerbation. Admit 08/2023 with COPD exacerbation trop up to 1600 at the time thought to be demand ischemia - 08/2023 echo: limited visualization, LVEF roughly 50-55%, indet diastolic, normal RV - repeat echo pending.   11/2020 cath: mild plaque of LAD with minimal luminal irregularities. Similar to 2017 caht  - in absence of significant echo findings no additional cardiology recs at this time - severe described as end stage COPD, typically elevated troponins in setting of exacerbations. Last discharge summary 01/29/24 for COPD exacerbation documents plans for home with hospice. -Continue to address goals of care. If end stage COPD with home hospice would look to minimize testing and consultations when she presents with COPD exacerbations.      Bonney Alvan Carrier, MD  02/14/2024, 10:05 AM

## 2024-02-14 NOTE — Care Management Obs Status (Signed)
 MEDICARE OBSERVATION STATUS NOTIFICATION   Patient Details  Name: MAIRA CHRISTON MRN: [id number] Date of Birth: 04-27-1955   Medicare Observation Status Notification Given:  Yes    Corean JAYSON Canary, RN 02/14/2024, 9:34 AM

## 2024-02-14 NOTE — Plan of Care (Signed)
  Problem: Nutrition: Goal: Adequate nutrition will be maintained Outcome: Progressing   Problem: Clinical Measurements: Goal: Respiratory complications will improve Outcome: Not Progressing   Problem: Activity: Goal: Risk for activity intolerance will decrease Outcome: Not Progressing   Problem: Coping: Goal: Level of anxiety will decrease Outcome: Not Progressing

## 2024-02-15 DIAGNOSIS — R7989 Other specified abnormal findings of blood chemistry: Secondary | ICD-10-CM | POA: Diagnosis not present

## 2024-02-15 DIAGNOSIS — J441 Chronic obstructive pulmonary disease with (acute) exacerbation: Secondary | ICD-10-CM

## 2024-02-15 DIAGNOSIS — I3139 Other pericardial effusion (noninflammatory): Secondary | ICD-10-CM | POA: Diagnosis not present

## 2024-02-15 LAB — CBC
HCT: 35.1 % — ABNORMAL LOW (ref 36.0–46.0)
Hemoglobin: 10.6 g/dL — ABNORMAL LOW (ref 12.0–15.0)
MCH: 29.7 pg (ref 26.0–34.0)
MCHC: 30.2 g/dL (ref 30.0–36.0)
MCV: 98.3 fL (ref 80.0–100.0)
Platelets: 272 K/uL (ref 150–400)
RBC: 3.57 MIL/uL — ABNORMAL LOW (ref 3.87–5.11)
RDW: 16 % — ABNORMAL HIGH (ref 11.5–15.5)
WBC: 18 K/uL — ABNORMAL HIGH (ref 4.0–10.5)
nRBC: 0 % (ref 0.0–0.2)

## 2024-02-15 LAB — BASIC METABOLIC PANEL WITH GFR
Anion gap: 8 (ref 5–15)
BUN: 36 mg/dL — ABNORMAL HIGH (ref 8–23)
CO2: 28 mmol/L (ref 22–32)
Calcium: 9.3 mg/dL (ref 8.9–10.3)
Chloride: 104 mmol/L (ref 98–111)
Creatinine, Ser: 1.31 mg/dL — ABNORMAL HIGH (ref 0.44–1.00)
GFR, Estimated: 44 mL/min — ABNORMAL LOW (ref >60)
Glucose, Bld: 175 mg/dL — ABNORMAL HIGH (ref 70–99)
Potassium: 4.6 mmol/L (ref 3.5–5.1)
Sodium: 140 mmol/L (ref 135–145)

## 2024-02-15 LAB — MAGNESIUM: Magnesium: 2.6 mg/dL — ABNORMAL HIGH (ref 1.7–2.4)

## 2024-02-15 MED ORDER — LORAZEPAM 2 MG/ML IJ SOLN
1.0000 mg | Freq: Once | INTRAMUSCULAR | Status: AC
  Administered 2024-02-15: 1 mg via INTRAVENOUS
  Filled 2024-02-15: qty 1

## 2024-02-15 MED ORDER — ENOXAPARIN SODIUM 40 MG/0.4ML IJ SOSY
40.0000 mg | PREFILLED_SYRINGE | INTRAMUSCULAR | Status: DC
  Filled 2024-02-15 (×3): qty 0.4

## 2024-02-15 MED ORDER — REVEFENACIN 175 MCG/3ML IN SOLN
175.0000 ug | Freq: Every day | RESPIRATORY_TRACT | Status: DC
  Administered 2024-02-15 – 2024-02-18 (×4): 175 ug via RESPIRATORY_TRACT
  Filled 2024-02-15 (×4): qty 3

## 2024-02-15 MED ORDER — IPRATROPIUM-ALBUTEROL 0.5-2.5 (3) MG/3ML IN SOLN
3.0000 mL | RESPIRATORY_TRACT | Status: DC | PRN
  Administered 2024-02-15: 3 mL via RESPIRATORY_TRACT
  Filled 2024-02-15: qty 3

## 2024-02-15 MED ORDER — LABETALOL HCL 5 MG/ML IV SOLN: 5.0000 mg | INTRAVENOUS | Status: DC | PRN

## 2024-02-15 MED ORDER — FENTANYL CITRATE (PF) 50 MCG/ML IJ SOSY: 25.0000 ug | PREFILLED_SYRINGE | INTRAMUSCULAR | Status: DC | PRN

## 2024-02-15 MED ORDER — BUSPIRONE HCL 5 MG PO TABS
5.0000 mg | ORAL_TABLET | Freq: Three times a day (TID) | ORAL | Status: DC
  Administered 2024-02-15 – 2024-02-18 (×9): 5 mg via ORAL
  Filled 2024-02-15 (×9): qty 1

## 2024-02-15 MED ORDER — FUROSEMIDE 10 MG/ML IJ SOLN
20.0000 mg | Freq: Every day | INTRAMUSCULAR | Status: DC
  Administered 2024-02-16 – 2024-02-17 (×2): 20 mg via INTRAVENOUS
  Filled 2024-02-15 (×2): qty 2

## 2024-02-15 MED ORDER — FUROSEMIDE 10 MG/ML IJ SOLN
20.0000 mg | Freq: Two times a day (BID) | INTRAMUSCULAR | Status: DC
  Administered 2024-02-15: 20 mg via INTRAVENOUS
  Filled 2024-02-15: qty 2

## 2024-02-15 MED ORDER — OXYCODONE HCL 5 MG PO TABS
5.0000 mg | ORAL_TABLET | ORAL | Status: DC | PRN
  Administered 2024-02-15 – 2024-02-17 (×3): 5 mg via ORAL
  Filled 2024-02-15 (×3): qty 1

## 2024-02-15 MED ORDER — ENSURE PLUS HIGH PROTEIN PO LIQD
237.0000 mL | Freq: Two times a day (BID) | ORAL | Status: DC
  Administered 2024-02-16 – 2024-02-18 (×4): 237 mL via ORAL

## 2024-02-15 NOTE — Hospital Course (Addendum)
 PMH of chronic combined CHF, Takotsubo, CKD stage IIIb, anxiety, chronic respiratory failure, COPD present to the hospital because of cough and shortness of breath. Currently treated for COPD exacerbation. Actively on hospice at home.  Currently revoked it.  Assessment and Plan: Acute on chronic respiratory failure with hypoxia  COPD exacerbation Presents with shortness of breath requiring more oxygen his baseline. Suspect combination of progressing of the COPD as well as mild exacerbation of COPD. Uses 3 LPM at home. Chest x-ray unremarkable. No concern for PE. Continue with COPD with steroids DuoNebs and antibiotic. Continue BiPAP as needed.  Patient brought in home BiPAP. Lasix  also daily.  Acute on chronic diastolic CHF Clinically appears euvolemic although symptom wise patient reports progressive shortness of breath with leg swelling. Treat with IV Lasix  and monitor. Echocardiogram noted EF to be 50 to 55% with indeterminate diastolic parameters when checked 08/07/2023. Strict I&Os and daily weights  Hypertension Blood pressures currently soft. Continue current regimen   Normocytic anemia Hemoglobin noted to be 11.8 which appears to priors.  No reports of bleeding Continue to monitor   Chronic kidney disease stage IIIb Creatinine baseline appears to be around 1.41.5. Monitor. Mild worsening was seen requiring IV fluid.   Severe anxiety and depression Panic attacks Switch Xanax  to Ativan . Continue Paxil  at home dose. On scheduled BuSpar Ativan  and morphine . Goal is for comfort.   Hyperlipidemia  Continue Crestor    OSA Continue BiPAP nightly after able to be weaned off  Goals of care conversation. Patient is on hospice at home. Currently has revoked her hospice for hospitalization due to respiratory distress. At present suspect that she is suffering from progression of the disease. Recommended her to consider symptom control as a priority rather than seeking  treatment due to progression and poorly controlled symptoms with anxiety. Patient verbalizes understanding. Palliative care consult further assistance after our conversation. Kim [name] verbalizes understanding of goal for do not hospitalize.  Obesity Class 1 Body mass index is 31.02 kg/m.  Placing the pt at higher risk of poor outcomes.

## 2024-02-15 NOTE — Plan of Care (Signed)

## 2024-02-15 NOTE — Progress Notes (Addendum)
 Rounding Note   Patient Name: Kim [name] Date of Encounter: 02/15/2024  [facility] Cardiologist: Lonni Cash, MD   Subjective  Feeling better. Breathing is improved.   Scheduled Meds:  arformoterol   15 mcg Nebulization BID   aspirin  EC  81 mg Oral Daily   benzonatate   100 mg Oral TID   budesonide   0.25 mg Nebulization BID   dextromethorphan   30 mg Oral BID   doxycycline   100 mg Oral Q12H   enoxaparin  (LOVENOX ) injection  40 mg Subcutaneous Q24H   furosemide   20 mg Intravenous BID   guaiFENesin   600 mg Oral BID   methylPREDNISolone  (SOLU-MEDROL ) injection  40 mg Intravenous Q12H   metoprolol  tartrate  25 mg Oral BID   PARoxetine   40 mg Oral q AM   rosuvastatin   20 mg Oral QHS   Continuous Infusions:  PRN Meds: albuterol , fentaNYL  (SUBLIMAZE ) injection, labetalol , LORazepam  **OR** LORazepam , nitroGLYCERIN    Vital Signs  Vitals:   02/15/24 0547 02/15/24 0600 02/15/24 0700 02/15/24 0734  BP:  (!) 146/81 126/80   Pulse: 70 70 67 84  Resp: 17 18 20 20   Temp:    98.3 F (36.8 C)  TempSrc:    Axillary  SpO2: 95% 96% 99% 100%  Weight: 75.7 kg     Height:        Intake/Output Summary (Last 24 hours) at 02/15/2024 0911 Last data filed at 02/14/2024 1900 Gross per 24 hour  Intake 120 ml  Output --  Net 120 ml      02/15/2024    5:47 AM 02/14/2024   12:39 PM 02/13/2024   10:22 PM  Last 3 Weights  Weight (lbs) 166 lb 14.2 oz 160 lb 0.9 oz 159 lb 2.8 oz  Weight (kg) 75.7 kg 72.6 kg 72.2 kg      Telemetry sinus - Personally Reviewed  ECG  No AM tracing - Personally Reviewed  Physical Exam  GEN: No acute distress.   Neck: No JVD Cardiac: RRR, no murmurs, rubs, or gallops.  Respiratory: Clear bilaterally. GI: Soft MS: No LE edema Neuro:  Nonfocal  Psych: Normal affect   Labs High Sensitivity Troponin:   Recent Labs  Lab 02/13/24 2119 02/13/24 2316 02/14/24 0103 02/14/24 0809 02/14/24 1135  TROPONINIHS 93* 111* 115* 95*  91*     Chemistry Recent Labs  Lab 02/13/24 1816 02/14/24 0103 02/14/24 1249 02/15/24 0230  NA 145 145 144 140  K 4.5 4.0 4.3 4.6  CL 107 100 105 104  CO2 24 26 26 28   GLUCOSE 111* 224* 103* 175*  BUN 18 23 24* 36*  CREATININE 1.36* 1.57* 1.11* 1.31*  CALCIUM  8.7* 8.8* 8.8* 9.3  MG 2.5* 2.6*  --  2.6*  PROT 6.1* 5.9*  --   --   ALBUMIN 3.6 3.4*  --   --   AST 44* 43*  --   --   ALT 35 35  --   --   ALKPHOS 70 68  --   --   BILITOT 0.6 0.5  --   --   GFRNONAA 42* 35* 53* 44*  ANIONGAP 14 19* 13 8    Lipids No results for input(s): CHOL, TRIG, HDL, LABVLDL, LDLCALC, CHOLHDL in the last 168 hours.  Hematology Recent Labs  Lab 02/13/24 1816 02/14/24 0103 02/15/24 0230  WBC 19.3* 15.2* 18.0*  RBC 3.96 3.69* 3.57*  HGB 11.8* 10.9* 10.6*  HCT 38.7 36.1 35.1*  MCV 97.7 97.8 98.3  MCH 29.8  29.5 29.7  MCHC 30.5 30.2 30.2  RDW 15.7* 15.9* 16.0*  PLT 305 282 272   Thyroid  No results for input(s): TSH, FREET4 in the last 168 hours.  BNP Recent Labs  Lab 02/13/24 1816  BNP 486.1*    DDimer  Recent Labs  Lab 02/14/24 1249  DDIMER 0.57*     Radiology  ECHOCARDIOGRAM COMPLETE Result Date: 02/14/2024    ECHOCARDIOGRAM REPORT   Patient Name:   Kim [name] Date of Exam: 02/14/2024 Medical Rec #:  [id number]       Height:       61.5 in Accession #:    [id number]      Weight:       160.1 lb Date of Birth:  November 10, 1955      BSA:          1.729 m Patient Age:    68 years        BP:           204/111 mmHg Patient Gender: F               HR:           89 bpm. Exam Location:  Inpatient Procedure: 2D Echo, Color Doppler and Cardiac Doppler (Both Spectral and Color            Flow Doppler were utilized during procedure). Indications:    Dyspnea R06.00  History:        Patient has prior history of Echocardiogram examinations, most                 recent 08/07/2023. COPD.  Sonographer:    Tinnie Gosling RDCS Referring Phys: [id number] GILLIAN HERO [facility]  Sonographer Comments:  Technically difficult study due to poor echo windows and patient is obese. Image acquisition challenging due to respiratory motion, Image acquisition challenging due to COPD and Image acquisition challenging due to patient body habitus. IMPRESSIONS  1. Left ventricular ejection fraction, by estimation, is 60 to 65%. The left ventricle has normal function. Left ventricular endocardial border not optimally defined to evaluate regional wall motion. Left ventricular diastolic parameters are indeterminate.  2. RV not well visualized, grossly appears normal in size and function. . Right ventricular systolic function was not well visualized. The right ventricular size is not well visualized.  3. Mild to moderate pericardial effusion. The pericardial effusion is circumferential. There is no evidence of cardiac tamponade.  4. The mitral valve was not well visualized. No evidence of mitral valve regurgitation. No evidence of mitral stenosis.  5. The aortic valve was not well visualized. Aortic valve regurgitation is mild. No aortic stenosis is present.  6. The inferior vena cava is normal in size with greater than 50% respiratory variability, suggesting right atrial pressure of 3 mmHg.  7. Technically difficult study, limited visualization FINDINGS  Left Ventricle: Left ventricular ejection fraction, by estimation, is 60 to 65%. The left ventricle has normal function. Left ventricular endocardial border not optimally defined to evaluate regional wall motion. The left ventricular internal cavity size was normal in size. There is no left ventricular hypertrophy. Left ventricular diastolic parameters are indeterminate. Right Ventricle: RV not well visualized, grossly appears normal in size and function. The right ventricular size is not well visualized. Right vetricular wall thickness was not well visualized. Right ventricular systolic function was not well visualized. Left Atrium: Left atrial size was not well visualized. Right  Atrium: Right atrial size was not well visualized. Pericardium: Mild to moderate  pericardial effusion. The pericardial effusion is circumferential. There is no evidence of cardiac tamponade. Mitral Valve: The mitral valve was not well visualized. No evidence of mitral valve regurgitation. No evidence of mitral valve stenosis. Tricuspid Valve: The tricuspid valve is not well visualized. Tricuspid valve regurgitation is not demonstrated. No evidence of tricuspid stenosis. Aortic Valve: The aortic valve was not well visualized. Aortic valve regurgitation is mild. No aortic stenosis is present. Aortic valve mean gradient measures 3.0 mmHg. Aortic valve peak gradient measures 7.8 mmHg. Pulmonic Valve: The pulmonic valve was not well visualized. Aorta: The aortic root was not well visualized. Venous: The inferior vena cava is normal in size with greater than 50% respiratory variability, suggesting right atrial pressure of 3 mmHg. IAS/Shunts: No atrial level shunt detected by color flow Doppler.   Diastology LV e' lateral:   7.07 cm/s LV E/e' lateral: 10.2  RIGHT VENTRICLE         IVC TAPSE (M-mode): 2.3 cm  IVC diam: 1.80 cm AORTIC VALVE AV Vmax:           139.74 cm/s AV Vmean:          77.947 cm/s AV VTI:            0.175 m AV Peak Grad:      7.8 mmHg AV Mean Grad:      3.0 mmHg LVOT Vmax:         118.00 cm/s LVOT Vmean:        77.600 cm/s LVOT VTI:          0.205 m LVOT/AV VTI ratio: 1.17  AORTA Ao Root diam: 2.90 cm MITRAL VALVE MV Area (PHT): 5.20 cm    SHUNTS MV Decel Time: 146 msec    Systemic VTI: 0.20 m MV E velocity: 72.30 cm/s MV A velocity: 80.20 cm/s MV E/A ratio:  0.90 Dorn Ross MD Electronically signed by Dorn Ross MD Signature Date/Time: 02/14/2024/3:20:31 PM    Final    DG Chest Port 1 View Result Date: 02/14/2024 EXAM: 1 VIEW(S) XRAY OF THE CHEST 02/14/2024 01:39:00 PM COMPARISON: 02/13/2024 CLINICAL HISTORY: Acute respiratory distress FINDINGS: LUNGS AND PLEURA: Chronic coarsened  interstitial markings. No pulmonary edema. Chronic right pleural thickening. No pleural effusions. No pneumothorax. HEART AND MEDIASTINUM: Atherosclerotic plaque noted. No acute abnormality of the cardiac and mediastinal silhouettes. BONES AND SOFT TISSUES: No acute osseous abnormality. IMPRESSION: 1. No acute cardiopulmonary process identified. Electronically signed by: Norman Gatlin MD 02/14/2024 02:00 PM EST RP Workstation: [id number]   DG Chest Port 1 View Result Date: 02/13/2024 EXAM: 1 VIEW(S) XRAY OF THE CHEST 02/13/2024 06:35:00 PM COMPARISON: 01/25/2024 CLINICAL HISTORY: dyspnea FINDINGS: LUNGS AND PLEURA: No focal pulmonary opacity. No pulmonary edema. No pleural effusion. No pneumothorax. HEART AND MEDIASTINUM: No acute abnormality of the cardiac and mediastinal silhouettes. Atherosclerotic plaque. BONES AND SOFT TISSUES: No acute osseous abnormality. IMPRESSION: 1. No acute process. Electronically signed by: Norman Gatlin MD 02/13/2024 06:50 PM EST RP Workstation: [id number]    Cardiac Studies   Patient Profile    67 y.o. female with history of COPD with chronic respiratory failure, NICM, CKD, anxiety admitted with COPD exacerbation and found to have mildly elevated troponin. No chest pain. Echo with normal LV and RV function with small pericardial effusion, no tamponade  Assessment & Plan   COPD exacerbation with mild elevation troponin: Her troponin elevation is likely due to demand ischemia. LV function is normal. No plans for an ischemic evaluation. She is now DNR at  end stage of her lung disease. Would not check a troponin again on future admissions.   Acute on chronic diastolic CHF (HFpEF): She is not grossly volume overloaded. Lasix  on hold. Renal function stable.   Pericardial effusion: Small effusion not of clinical significance. No evidence of tamponade  Cardiology will sign off. Please call with questions.   For questions or updates, please contact [facility] Please consult [email or web address] for contact info under     Signed, Lonni Cash, MD  02/15/2024, 9:11 AM

## 2024-02-15 NOTE — Progress Notes (Signed)
 [facility] Patient: Kim [name] [name] FMW:[id number] DOB: [birth year]  DOA: 02/13/2024 DOS: the patient was seen and examined on 02/15/2024  Brief Hospital Course: PMH of chronic combined CHF, Takotsubo, CKD stage IIIb, anxiety, chronic respiratory failure, COPD present to the hospital because of cough and shortness of breath. Currently treated for COPD exacerbation. Actively on hospice at home.  Currently revoked it.  Assessment and Plan: Acute on chronic respiratory failure with hypoxia  COPD exacerbation Presents with shortness of breath requiring more oxygen his baseline. Suspect combination of progressing of the COPD as well as mild exacerbation of COPD. Uses 3 LPM at home. Chest x-ray unremarkable. Initially admitted had concerns about possibility for PE although patient's symptoms does not appear to be consistent with PE. D-dimer is only minimally elevated. For now no further workup necessary. Continue with COPD with steroids DuoNebs and antibiotic. Continue BiPAP as needed.  Lasix  also daily.  Acute on chronic diastolic CHF Clinically appears euvolemic although symptom wise patient reports progressive shortness of breath with leg swelling. Treat with IV Lasix  and monitor. Echocardiogram noted EF to be 50 to 55% with indeterminate diastolic parameters when checked 08/07/2023. Strict I&Os and daily weights  Hypertension Blood pressures currently soft. Continue current regimen   Normocytic anemia Hemoglobin noted to be 11.8 which appears to priors.  No reports of bleeding Continue to monitor   Chronic kidney disease stage IIIb Creatinine baseline appears to be around 1.41.5. Monitor. Mild worsening was seen requiring IV fluid.   Severe anxiety and depression Panic attacks Switch Xanax  to Ativan . Continue Paxil  at home dose. Added scheduled BuSpar.   Hyperlipidemia  Continue Crestor    OSA Continue BiPAP nightly after able to be weaned off  Goals  of care conversation. Patient is on hospice at home. Currently has revoked her hospice for hospitalization due to respiratory distress. At present suspect that she is suffering from progression of the disease. Recommended her to consider symptom control as a priority rather than seeking treatment due to progression and poorly controlled symptoms with anxiety. Patient verbalizes understanding. Palliative care consult further assistance after our conversation.  Obesity Class 1 Body mass index is 31.02 kg/m.  Placing the pt at higher risk of poor outcomes.    Subjective: Ongoing shortness of breath.  No nausea or vomiting.  Ongoing frequent anxiety attacks.  No chest pain.  Physical Exam: Basal crackles. Expiratory wheeze. S1-S2 present Bowel sounds present Bilateral edema  Data Reviewed: I have Reviewed nursing notes, Vitals, and Lab results. Since last encounter, pertinent lab results CBC and BMP   . I have ordered test including CBC and BMP  . I have discussed pt's care plan and test results with palliative care  .   Disposition: Status is: Inpatient Remains inpatient appropriate because: Monitor for improvement in respiratory status  enoxaparin  (LOVENOX ) injection 40 mg Start: 02/15/24 1000   Family Communication: Family at bedside Level of care: Progressive   Vitals:   02/15/24 0734 02/15/24 1100 02/15/24 1126 02/15/24 1600  BP:  (!) 144/75  (!) 168/77  Pulse: 84 64 65 70  Resp: 20 20 20  (!) 21  Temp: 98.3 F (36.8 C)  97.8 F (36.6 C) 97.6 F (36.4 C)  TempSrc: Axillary  Oral   SpO2: 100% 100% 99% 100%  Weight:      Height:         Author: Yetta Blanch, MD 02/15/2024 5:39 PM  Please look on [email or web address] to find out who is on call.

## 2024-02-15 NOTE — TOC Progression Note (Signed)
 Transition of Care [facility]) - Progression Note    Patient Details  Name: Kim [name] MRN: [id number] Date of Birth: Nov 07, 1955  [facility] CM/SW Contact  Waddell Barnie Rama, RN Phone Number: 02/15/2024, 10:12 AM  Clinical Narrative:    NCM received call from Kasie with [facility] and states patient is active with them for home hospice.                     Expected Discharge Plan and Services                                               Social Drivers of Health (SDOH) Interventions SDOH Screenings   Food Insecurity: No Food Insecurity (02/13/2024)  Housing: Low Risk  (02/13/2024)  Transportation Needs: No Transportation Needs (02/13/2024)  Utilities: Not At Risk (02/13/2024)  Alcohol  Screen: Low Risk  (03/22/2020)  Depression (PHQ2-9): Medium Risk (09/10/2021)  Financial Resource Strain: High Risk (09/10/2021)  Physical Activity: Inactive (03/25/2021)  Social Connections: Moderately Isolated (02/13/2024)  Stress: No Stress Concern Present (09/10/2021)  Tobacco Use: Medium Risk (02/13/2024)    Readmission Risk Interventions    11/17/2023   11:05 AM 05/17/2023   12:49 PM 09/20/2021    3:27 PM  Readmission Risk Prevention Plan  Transportation Screening Complete Complete Complete  PCP or Specialist Appt within 3-5 Days Complete Complete   HRI or Home Care Consult Complete Complete   Social Work Consult for Recovery Care Planning/Counseling  Complete   Palliative Care Screening Not Applicable Not Applicable   Medication Review Oceanographer) Complete Complete Complete  PCP or Specialist appointment within 3-5 days of discharge   Complete  HRI or Home Care Consult   Complete  SW Recovery Care/Counseling Consult   Complete  Palliative Care Screening   Not Applicable  Skilled Nursing Facility   Not Applicable

## 2024-02-15 NOTE — Progress Notes (Signed)
 On call contacted this shift re: pt woke up from sleep with phlebotomy lab draw with panic attack. PRN's given, minimal effect.   9681: One time order Ativan  1mg  IV dose given, effective.

## 2024-02-15 NOTE — Progress Notes (Signed)
 PT placed on BIPAP.   02/15/24 2310  BiPAP/CPAP/SIPAP  BiPAP/CPAP/SIPAP Pt Type Adult  BiPAP/CPAP/SIPAP SERVO  Mask Type Nasal mask  Dentures removed? Yes - Placed in denture cup  Mask Size Medium  Set Rate 16 breaths/min  Respiratory Rate 2 breaths/min  IPAP 15 cmH20  EPAP 5 cmH2O  Pressure Support 5 cmH20  PEEP 5 cmH20  FiO2 (%) 50 %  Flow Rate 0 lpm  Minute Ventilation 13.1  Leak 38  Peak Inspiratory Pressure (PIP) 14  Tidal Volume (Vt) 401  Patient Home Machine No  Patient Home Mask Yes  Patient Home Tubing No  Auto Titrate No  Press High Alarm 30 cmH2O  Press Low Alarm 2 cmH2O  Nasal massage performed Yes  CPAP/SIPAP surface wiped down Yes  Device Plugged into RED Power Outlet Yes  Oxygen Percent 50 %

## 2024-02-15 NOTE — Plan of Care (Signed)
  Problem: Education: Goal: Knowledge of General Education information will improve Description: Including pain rating scale, medication(s)/side effects and non-pharmacologic comfort measures Outcome: Progressing   Problem: Health Behavior/Discharge Planning: Goal: Ability to manage health-related needs will improve Outcome: Progressing   Problem: Clinical Measurements: Goal: Respiratory complications will improve Outcome: Progressing   Problem: Coping: Goal: Level of anxiety will decrease Outcome: Progressing   Problem: Pain Managment: Goal: General experience of comfort will improve and/or be controlled Outcome: Progressing   Problem: Respiratory: Goal: Levels of oxygenation will improve Outcome: Progressing Goal: Ability to maintain adequate ventilation will improve Outcome: Progressing

## 2024-02-15 NOTE — TOC Initial Note (Signed)
 Transition of Care [facility]) - Initial/Assessment Note    Patient Details  Name: Kim [name] MRN: [id number] Date of Birth: 1955-07-12  Transition of Care [facility]) CM/SW Contact:    Waddell Barnie Rama, RN Phone Number: 02/15/2024, 3:30 PM  Clinical Narrative:                 From home with spouse, has PCP and insurance on file, states she has home hospice with [facility] in place at this time  and would like to continue with them, has home oxygen at home.  States family member  (spouse) will transport them home at [employer] and family is support system, .  Pta she can stand and pivot to chair per spouse so he will be taking her home by car and he will bring her oxygen to go home with.   Expected Discharge Plan: Home w Hospice Care Barriers to Discharge: Continued Medical Work up   Patient Goals and CMS Choice Patient states their goals for this hospitalization and ongoing recovery are:: return home with [facility] CMS Medicare.gov Compare Post Acute Care list provided to:: Patient Choice offered to / list presented to : Patient      Expected Discharge Plan and Services   Discharge Planning Services: CM Consult Post Acute Care Choice: Hospice Living arrangements for the past 2 months: Single Family Home                 DME Arranged: N/A DME Agency: NA       HH Arranged: RN HH Agency:  (medi home hospice) Date HH Agency Contacted: 02/15/24 Time HH Agency Contacted: 1530 Representative spoke with at [facility] Agency: Kasie  Prior Living Arrangements/Services Living arrangements for the past 2 months: Single Family Home Lives with:: Spouse Patient language and need for interpreter reviewed:: Yes Do you feel safe going back to the place where you live?: Yes      Need for Family Participation in Patient Care: Yes (Comment) Care giver support system in place?: Yes (comment) Current home services: DME (home oxygen) Criminal Activity/Legal Involvement Pertinent to Current  Situation/Hospitalization: No - Comment as needed  Activities of Daily Living   ADL Screening (condition at time of admission) Independently performs ADLs?: No Does the patient have a NEW difficulty with bathing/dressing/toileting/self-feeding that is expected to last >3 days?: Yes (Initiates electronic notice to provider for possible OT consult) Does the patient have a NEW difficulty with getting in/out of bed, walking, or climbing stairs that is expected to last >3 days?: Yes (Initiates electronic notice to provider for possible PT consult) Does the patient have a NEW difficulty with communication that is expected to last >3 days?: No Is the patient deaf or have difficulty hearing?: No Does the patient have difficulty seeing, even when wearing glasses/contacts?: No Does the patient have difficulty concentrating, remembering, or making decisions?: No  Permission Sought/Granted Permission sought to share information with : Case Manager Permission granted to share information with : Yes, Verbal Permission Granted     Permission granted to share info w AGENCY: Hospice        Emotional Assessment Appearance:: Appears stated age Attitude/Demeanor/Rapport: Engaged Affect (typically observed): Appropriate Orientation: : Oriented to Self, Oriented to Place, Oriented to  Time, Oriented to Situation Alcohol  / Substance Use: Not Applicable Psych Involvement: No (comment)  Admission diagnosis:  COPD exacerbation (HCC) [J44.1] Patient Active Problem List   Diagnosis Date Noted   Myocardial injury 02/14/2024   Normocytic anemia 01/25/2024  Leukocytosis 11/16/2023   Hypokalemia 11/16/2023   Chronic kidney disease, stage 3b (HCC) 11/16/2023   Acute on chronic respiratory failure with hypoxia (HCC) 08/07/2023   Urinary retention 05/12/2023   Shingles rash 05/12/2023   Chronic pain syndrome 05/12/2023   COPD with acute exacerbation (HCC) 10/09/2022   AKI (acute kidney injury) 10/09/2022    COPD exacerbation (HCC) 09/11/2022   Acute on chronic respiratory failure with hypoxia and hypercapnia (HCC) 09/10/2022   Myalgia due to statin 06/05/2022   Medication reaction 02/12/2022   NSVT (nonsustained ventricular tachycardia) (HCC) 10/11/2021   Hyperkalemia 09/22/2021   SOB (shortness of breath) 09/19/2021   Chronic respiratory failure with hypoxia (HCC)    DNR (do not resuscitate) 09/17/2021   Double vision 08/26/2021   Acute renal failure superimposed on stage 3b chronic kidney disease (HCC) 08/26/2021   Postconcussive syndrome 08/26/2021   Elevated troponin 08/26/2021   Subarachnoid hemorrhage (HCC) 08/26/2021   Syncope and collapse 08/26/2021   Chronic diastolic CHF (congestive heart failure) (HCC) 08/26/2021   NSTEMI (non-ST elevated myocardial infarction) (HCC) 11/17/2020   Osteoporosis 06/05/2020   Chronic HFrEF (heart failure with reduced ejection fraction) (HCC) 03/24/2020   Essential hypertension 03/24/2020   Vertebral fracture, osteoporotic (HCC) 03/05/2020   History of colonic polyps    Benign neoplasm of ascending colon    Neuritis 01/29/2017   Alpha-1-antitrypsin deficiency carrier 01/19/2017   Sleep apnea 01/14/2017   Dependent edema 01/14/2017   Orthostatic hypotension    Allergic rhinitis 08/10/2015   Acute on chronic combined systolic and diastolic CHF (congestive heart failure) (HCC)    Hyperglycemia    Coronary artery calcification seen on CAT scan 07/02/2015   Solitary pulmonary nodule 12/02/2013   Loss of weight 11/10/2013   Osteoarthritis 06/05/2009   Brachial neuritis or radiculitis 06/05/2009   Hyperlipidemia 03/12/2009   NEPHROLITHIASIS 01/05/2008   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 [place] [address] Phone: [phone] Fax: [phone]  [facility] - [facility] [address] [place] [address] Phone: [phone] Fax: [phone]     Social Drivers of Health (SDOH) Social History: SDOH Screenings   Food Insecurity: No Food Insecurity (02/13/2024)  Housing: Low Risk  (02/13/2024)  Transportation Needs: No Transportation Needs (02/13/2024)  Utilities: Not At Risk (02/13/2024)  Alcohol  Screen: Low Risk  (03/22/2020)  Depression (PHQ2-9): Medium Risk (09/10/2021)  Financial Resource Strain: High Risk (09/10/2021)  Physical Activity: Inactive (03/25/2021)  Social Connections: Moderately Isolated (02/13/2024)  Stress: No Stress Concern Present (09/10/2021)  Tobacco Use: Medium Risk (02/13/2024)   SDOH Interventions:     Readmission Risk Interventions    02/15/2024    3:27 PM 11/17/2023   11:05 AM 05/17/2023   12:49 PM  Readmission Risk Prevention Plan  Transportation Screening Complete Complete Complete  PCP or Specialist Appt within 3-5 Days  Complete Complete  HRI or Home Care Consult  Complete Complete  Social Work Consult for Recovery Care Planning/Counseling   Complete  Palliative Care Screening  Not Applicable Not Applicable  Medication Review Oceanographer) Complete Complete Complete  HRI or Home Care Consult Complete    Palliative Care Screening  Complete    Skilled Nursing Facility Not Applicable

## 2024-02-15 NOTE — Consult Note (Signed)
 Palliative Care Consult Note                                  Date: 02/15/2024   Patient Name: Kim [name]  DOB:02-Feb-1956  FMW:[id number]  Age / Sex:68 y.o., female  PCP: Merna Huxley, NP Referring Physician: Tobie Yetta HERO, MD  Reason for Consultation: Establishing goals of care, Non pain symptom management, and Pain control  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/2008   OSTEOARTHRITIS 06/05/2009   PARESTHESIA 08/24/2007   TOBACCO ABUSE 01/25/2010     Assessment & Plan:   HPI/Patient Profile: 68 y.o. 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 Patient's exacerbation was not adequately managed in time at home and resulted in her husband calling emergency services before they were able to call home hospice service for guidance. Patient is a hospice patient with [facility] and they are aware of her hospitalization.   2 COPD exacerbations requiring admissions in the last 3 weeks with 4 total in the last 6 months. Leukocytosis with current WBC at 18, which is about her baseline in the last year. EF of 60% (02/2024) remains stable compared to prior studies.   SUMMARY OF RECOMMENDATIONS   DNR - limited Continue current regimen of Oxycodone  and Ativan  PRN for the next 24 hours and will plan to schedule based on patient needs in coordination with home hospice medical director to optimize plan for discharge Encourage patient and hospice to develop an exacerbation action plan to help manage symptoms prior to calling hospice or emergency services to reduce need to transfer to hospital  Symptom Management:  Per primary team  Code Status: DNR - Limited  (DNR/DNI)  Prognosis:  < 6 months  Discharge Planning:  Home with Hospice   Discussed with: Tobie MD, Oneita MD, Kendrick RN, patient, husband  Subjective:   Reviewed medical records, received report from team, assessed the patient and then meet at the patient's bedside to discuss diagnosis, prognosis, GOC, EOL wishes disposition and options.  Before meeting with the patient/family, I spent time reviewing the chart notes including H&P, prior hospitalization notes including palliative notes and cardiology notes. I also reviewed vital signs, nursing flowsheets, medication administrations record, labs, and imaging.  I met with patient and husband bedside, we were joined by [facility] chaplain and social worker.   We meet to discuss diagnosis prognosis, GOC, EOL wishes, disposition and options. Concept of Palliative Care was introduced as specialized medical care for people and their families living with serious illness.  If focuses on providing relief from the symptoms and stress of a serious illness.  The goal is to improve quality of life for both the patient and the family. Values and goals of care important to patient and family were attempted to be elicited.  Created space and opportunity for patient  and family to explore thoughts and feelings regarding current medical situation   Natural trajectory and current clinical status were discussed. Questions and concerns addressed. Patient encouraged to call with questions or concerns.    Patient/Family Understanding of Illness: - Patient and family understand that the patient is at end stage COPD  with symptoms that are not adequately managed at home - Patient and family are accepting that her time may be short and she would like to be in as little discomfort and dyspnea as possible while maintaining consciousness as much as possible  Life Review - Per chart review patient has been married 22 years and has 2 sons as well as several  grandchildren  Baseline Status: - Patient is severely debilitated due to end stage COPD and spends most of her time in bed - Her husband Jakie) is her primary caregiver  Today's Discussion: - Discussed with the overall trajectory and her frequent hospitalizations that is now occurring more frequently which means that her disease is progressing but also that her symptoms are not managed adequately - Discussed with patient about scheduling medications to meet her needs so that she is not having these frequent exacerbations - Discussed that her goal of managing her symptoms while maintaining her consciousness is a good goal but it will be hard to achieve going forward if her COPD worsens which will require more medication management - Also discussed developing an action plan so that patient and husband are not panicked during an exacerbation and know what to do prior to calling hospice/emergency services - Reiterated the need to call hospice services prior to calling emergency services so that her symptoms can be better managed and possibly not needing hospitalization as the patient and husband want to prioritize being at home - [facility] shared with family and the PMT that patient previously expressed that she would not want to pass away at home, but now it seems patient and husband are at peace with that outcome and would not necessarily need to be transferred to an inpatient hospice facility near the end of life   Goals: - To be comfortable while also being as alert as possible  Review of Systems  Respiratory:  Positive for shortness of breath.     Objective:   Primary Diagnoses: Present on Admission:  COPD exacerbation (HCC)   Vital Signs:  BP (!) 144/75   Pulse 65   Temp 97.8 F (36.6 C) (Oral)   Resp 20   Ht 5' 1.5 (1.562 m)   Wt 75.7 kg   SpO2 99%   BMI 31.02 kg/m   Physical Exam Constitutional:      Appearance: She is ill-appearing.  HENT:     Head:  Normocephalic.  Eyes:     Extraocular Movements: Extraocular movements intact.  Cardiovascular:     Rate and Rhythm: Normal rate.  Pulmonary:     Effort: Tachypnea and accessory muscle usage present.     Comments: On 3 L/min [place] Musculoskeletal:     Right lower leg: No edema.     Left lower leg: No edema.  Skin:    Comments: Cold extremities  Neurological:     General: No focal deficit present.     Mental Status: She is alert.  Psychiatric:        Mood and Affect: Mood is anxious.     Palliative Assessment/Data: 50%   Thank you for allowing us  to participate in the care of JUELLE DICKMANN PMT will continue to support holistically.  Time Total: 75 minutes  Detailed review of medical records (labs, imaging, vital signs), medically appropriate exam, discussed with treatment team, counseling and education to patient, family, & staff, documenting clinical information, medication management, coordination of care.  Signed by: Fairy FORBES Shan DEVONNA Palliative Medicine Team  Team Phone #  [phone] (Nights/Weekends)  02/15/2024, 3:30 PM

## 2024-02-15 NOTE — Progress Notes (Signed)
 PT on BIPAP at this time.   02/15/24 2004  BiPAP/CPAP/SIPAP  BiPAP/CPAP/SIPAP Pt Type Adult  BiPAP/CPAP/SIPAP SERVO  Mask Type Nasal mask  Dentures removed? Not applicable  Mask Size Medium  Set Rate 16 breaths/min  Respiratory Rate 26 breaths/min  IPAP 15 cmH20  EPAP 5 cmH2O  Pressure Support 5 cmH20  PEEP 5 cmH20  FiO2 (%) 50 %  Flow Rate 0 lpm  Minute Ventilation 12.3  Leak 43  Peak Inspiratory Pressure (PIP) 15  Tidal Volume (Vt) 305  Patient Home Machine No  Patient Home Mask Yes  Patient Home Tubing No  Auto Titrate No  Press High Alarm 30 cmH2O  Press Low Alarm 2 cmH2O  Nasal massage performed Yes  CPAP/SIPAP surface wiped down Yes  Device Plugged into RED Power Outlet Yes  Oxygen Percent 50 %

## 2024-02-15 NOTE — Progress Notes (Signed)
 Initial Nutrition Assessment  DOCUMENTATION CODES:   Obesity unspecified  INTERVENTION:  Ensure Plus High Protein PO BID. Each supplement provides 350 Kcals and 20 grams of protein. Continue heart-healthy diet.   NUTRITION DIAGNOSIS:   Inadequate protein intake related to social / environmental circumstances as evidenced by estimated needs.   GOAL:   Patient will meet greater than or equal to 90% of their needs   MONITOR:   PO intake, Supplement acceptance, Labs, Weight trends  REASON FOR ASSESSMENT:   Consult Assessment of nutrition requirement/status  ASSESSMENT:   Patient presented with shortness of breath and was found to have COPD exacerbation with acute on chronic respiratory failure. PMH significant for CHF, Takotsubo, CKD3b, HTN, dyslipidemia, OSA, and anxiety/depression.  Visited the patient with her husband [name] at bedside. The patient states she is having a good appetite and was also eating well prior to admission. Her UBW is around 150-154 lbs which she last weighed in Aug/Sept, weight gain only minimally attributed to fluid accumulation (currently non-pitting generalized.) She denies issues ambulating but is typically quite sedentary. She states she is aware of low-sodium diet and tries to select TV dinners from [employer] with less sodium. She drinks an Ensure occasionally and would like some while she's admitted. Typical PO intake history reveals likely chronic inadequate protein intake.  Typical day's intake: Breakfast - sausage, egg, cheese croissant with caramel coffee Lunch - skips Dinner - [employer] TV dinner such as chicken with broccoli ONS - occasionally drinks Ensure  Scheduled Meds:  arformoterol   15 mcg Nebulization BID   aspirin  EC  81 mg Oral Daily   benzonatate   100 mg Oral TID   budesonide   0.25 mg Nebulization BID   busPIRone  5 mg Oral TID   doxycycline   100 mg Oral Q12H   enoxaparin  (LOVENOX ) injection  40 mg Subcutaneous  Q24H   [START ON 02/16/2024] furosemide   20 mg Intravenous Daily   guaiFENesin   600 mg Oral BID   methylPREDNISolone  (SOLU-MEDROL ) injection  40 mg Intravenous Q12H   metoprolol  tartrate  25 mg Oral BID   PARoxetine   40 mg Oral q AM   revefenacin   175 mcg Nebulization Daily   rosuvastatin   20 mg Oral QHS    Diet Order             Diet Heart Fluid consistency: Thin  Diet effective now                  Meal Intake: 100%  Labs:     Latest Ref Rng & Units 02/15/2024    2:30 AM 02/14/2024   12:49 PM 02/14/2024    1:03 AM  CMP  Glucose 70 - 99 mg/dL 824  896  775   BUN 8 - 23 mg/dL 36  24  23   Creatinine 0.44 - 1.00 mg/dL 8.68  8.88  8.42   Sodium 135 - 145 mmol/L 140  144  145   Potassium 3.5 - 5.1 mmol/L 4.6  4.3  4.0   Chloride 98 - 111 mmol/L 104  105  100   CO2 22 - 32 mmol/L 28  26  26    Calcium  8.9 - 10.3 mg/dL 9.3  8.8  8.8   Total Protein 6.5 - 8.1 g/dL   5.9   Total Bilirubin 0.0 - 1.2 mg/dL   0.5   Alkaline Phos 38 - 126 U/L   68   AST 15 - 41 U/L   43  ALT 0 - 44 U/L   35    I/O: + 170 mL since admit  NUTRITION - FOCUSED PHYSICAL EXAM:  Flowsheet Row Most Recent Value  Orbital Region No depletion  Upper Arm Region No depletion  Thoracic and Lumbar Region No depletion  Buccal Region No depletion  Temple Region No depletion  Clavicle Bone Region No depletion  Clavicle and Acromion Bone Region No depletion  Scapular Bone Region Mild depletion  Dorsal Hand Mild depletion  Patellar Region Severe depletion  Anterior Thigh Region No depletion  Posterior Calf Region Mild depletion  Edema (RD Assessment) Mild  [generalized non-pitting]  Hair Reviewed  Eyes Reviewed  Mouth --  [edentulous requiring dentures]  Skin Reviewed  Nails Reviewed    EDUCATION NEEDS:   Education needs have been addressed  Skin:  Skin Assessment: Reviewed RN Assessment  Last BM:  11/10 type 2  Height:   Ht Readings from Last 1 Encounters:  02/14/24 5' 1.5 (1.562 m)     Weight:    Ideal Body Weight:  50 kg  BMI:  Body mass index is 31.02 kg/m.  Estimated Nutritional Needs:  Kcal:  1500-1800 Protein:  90-115 Fluid:  >1800    Kim Ruth, MS, RDN, LDN [place]. [facility] See AMION for contact information

## 2024-02-16 DIAGNOSIS — J441 Chronic obstructive pulmonary disease with (acute) exacerbation: Secondary | ICD-10-CM | POA: Diagnosis not present

## 2024-02-16 LAB — CBC
HCT: 33.2 % — ABNORMAL LOW (ref 36.0–46.0)
Hemoglobin: 10.4 g/dL — ABNORMAL LOW (ref 12.0–15.0)
MCH: 30 pg (ref 26.0–34.0)
MCHC: 31.3 g/dL (ref 30.0–36.0)
MCV: 95.7 fL (ref 80.0–100.0)
Platelets: 257 K/uL (ref 150–400)
RBC: 3.47 MIL/uL — ABNORMAL LOW (ref 3.87–5.11)
RDW: 15.9 % — ABNORMAL HIGH (ref 11.5–15.5)
WBC: 14.9 K/uL — ABNORMAL HIGH (ref 4.0–10.5)
nRBC: 0 % (ref 0.0–0.2)

## 2024-02-16 LAB — BASIC METABOLIC PANEL WITH GFR
Anion gap: 11 (ref 5–15)
BUN: 43 mg/dL — ABNORMAL HIGH (ref 8–23)
CO2: 30 mmol/L (ref 22–32)
Calcium: 9.7 mg/dL (ref 8.9–10.3)
Chloride: 103 mmol/L (ref 98–111)
Creatinine, Ser: 1.13 mg/dL — ABNORMAL HIGH (ref 0.44–1.00)
GFR, Estimated: 52 mL/min — ABNORMAL LOW (ref >60)
Glucose, Bld: 178 mg/dL — ABNORMAL HIGH (ref 70–99)
Potassium: 4.8 mmol/L (ref 3.5–5.1)
Sodium: 144 mmol/L (ref 135–145)

## 2024-02-16 LAB — MAGNESIUM: Magnesium: 2.2 mg/dL (ref 1.7–2.4)

## 2024-02-16 MED ORDER — MORPHINE SULFATE (PF) 2 MG/ML IV SOLN
2.0000 mg | Freq: Once | INTRAVENOUS | Status: AC
  Administered 2024-02-16: 2 mg via INTRAVENOUS
  Filled 2024-02-16: qty 1

## 2024-02-16 MED ORDER — LORAZEPAM 0.5 MG PO TABS
0.5000 mg | ORAL_TABLET | Freq: Three times a day (TID) | ORAL | Status: DC
  Administered 2024-02-16 – 2024-02-17 (×3): 0.5 mg via ORAL
  Filled 2024-02-16 (×3): qty 1

## 2024-02-16 MED ORDER — MORPHINE SULFATE (CONCENTRATE) 10 MG /0.5 ML PO SOLN
5.0000 mg | Freq: Three times a day (TID) | ORAL | Status: DC
Start: 2024-02-16 — End: 2024-02-17
  Administered 2024-02-16 – 2024-02-17 (×3): 5 mg via ORAL
  Filled 2024-02-16 (×3): qty 0.5

## 2024-02-16 NOTE — Progress Notes (Signed)
 Called to patient's room because patient was having issues with the bipap.   Pt noted to be anxious and constantly talking, causing the bipap mask not to stay in place.  Pt educated regarding this.  Attempted some relaxation techniques but her anxiety is not controlled at this time.  Family at bedside assisting as able.  Pt resting comfortably on the bipap with o2 saturations 100% before this writer exited the room.

## 2024-02-16 NOTE — Plan of Care (Signed)

## 2024-02-16 NOTE — Progress Notes (Signed)
 Daily Progress Note   Patient Name: Kim [name]       Date: 02/16/2024 DOB: June 14, 1955  Age: 68 y.o. MRN#: [id number] Attending Physician: Kim Yetta HERO, MD Primary Care Physician: Kim Huxley, NP Admit Date: 02/13/2024 Length of Stay: 2 days  Reason for Consultation/Follow-up: Establishing goals of care and Non pain symptom management  Subjective:  Subjective: EMR reviewed including review of notes from hospitalist, cardiology, respiratory therapy, bedside nursing and TOC.  Labs today notable for sodium 144, potassium 4.8, creatinine 1.13, GFR 52, hemoglobin 10.4.  Most recent chest x-ray from 11/9 reviewed personally and no acute process identified per my read.  This morning I was able to reach out and discussed plan with Dr. Vaughn, who is her hospice attending out of the hospital.  We discussed her continued panic attacks, shortness of breath, and recurrent hospitalizations.  Recommendation for consideration for scheduled medications to help alleviate symptoms and hopefully allow her better quality of life while minimizing panic attacks due to uncontrolled symptoms.  Dr.  Vaughn was in agreement with recommendation for initiation of low-dose scheduled opioids.  I saw and examined Kim [name] today and met with her husband, Kim [name], at the bedside.  Discussed goal of being at home and avoiding further hospitalizations and hospice will continue to support them in this.  We reviewed symptom management and she reports at this time she feels little short of breath, but not too bad overall.  We reviewed plan for scheduled medications and Dr. Abbe preference for starting with scheduled morphine  3 times a day.  Reviewed her creatinine and GFR is back up to 52, so this would not preclude use of morphine  at this time.  Also discussed that if this is ineffective, we could also consider initiation of low-dose scheduled Ativan  as well.  Received call later in the day that she had another panic  attack, and so I saw her again this afternoon and we will plan for scheduled morphine  5 mg in addition to scheduled Ativan  0.5 mg 3 times a day.  Her son, Kim [name], was at the bedside during second encounter and I reviewed plan of care, goals to be home, and continued hospice support plan with him as well.   Review of Systems Dyspnea and anxiety Objective:   Vital Signs:  BP (!) 175/71 (BP Location: Right Arm)   Pulse 65   Temp 98.6 F (37 C) (Oral)   Resp 20   Ht 5' 1.5 (1.562 m)   Wt 75.7 kg   SpO2 100%   BMI 31.02 kg/m   Physical Exam: General: Minimal respiratory distress, alert Eyes: conjunctiva clear, anicteric sclera HENT: normocephalic, atraumatic, moist mucous membranes, nasal cannula in place Cardiovascular: RRR, no edema in LE b/l Respiratory: Mildly increased work of breathing noted, diminished throughout Extremities: 1+ edema Skin: no rashes or lesions on visible skin Neuro: A&Ox4, following commands easily Psych: appropriately answers all questions but seems anxious  Imaging: @IMAGES @  I personally reviewed recent imaging.   Assessment & Plan:   Assessment: 68 y.o. 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 Patient's exacerbation was not adequately managed in time at home and resulted in her husband calling emergency services before they were able to call home hospice service for guidance. Patient is a hospice patient with [name] and they are aware of her hospitalization.    Recommendations/Plan: # Complex medical decision making/goals of care:  - Goal  to return home with hospice services when ready for discharge.  - Discussed with hospice attending regarding symptom management and plan of care.  Recommending consideration for developing an action plan for when she has panic attacks at home, as this seems to be when patient's husband calls 911.  -  Code Status: Limited: Do not  attempt resuscitation (DNR) -DNR-LIMITED -Do Not Intubate/DNI   Prognosis: < 6 months  # Symptom management: Patient is receiving these palliative interventions for symptom management with an intent to improve quality of life.   - Dyspnea: Patient scheduled morphine  5 mg p.o. 3 times a day.  Continue oxycodone  as needed for breakthrough with consideration to rotate breakthrough to morphine  as well as long as she tolerates morphine  and GFR stable.  -Anxiety continue Paxil  buspirone.   With continued panic attacks this morning after scheduled morphine , will also add on scheduled lorazepam  0.5mg  TID as well.  # Psychosocial Support:  -family including husband and son  # Discharge Planning: Home with Hospice  -  Discussed with: patient, family, Dr. Tobie  Thank you for allowing the palliative care team to participate in the care Kim [name].  Kim Meissner, MD Palliative Care Provider PMT # [phone]  If patient remains symptomatic despite maximum doses, please call PMT at [phone] between 0700 and 1900. Outside of these hours, please call attending, as PMT does not have night coverage.   I personally spent a total of 55 minutes in the care of the patient today during 2 encounters including preparing to see the patient, getting/reviewing separately obtained history, performing a medically appropriate exam/evaluation, counseling and educating, referring and communicating with other health care professionals, documenting clinical information in the EHR, and coordinating care.

## 2024-02-16 NOTE — Plan of Care (Signed)

## 2024-02-16 NOTE — Progress Notes (Signed)
 [facility] Progress Note Patient: Kim [name] FMW:[id number] DOB: [birth year]  DOA: 02/13/2024 DOS: the patient was seen and examined on 02/16/2024  Brief Hospital Course: PMH of chronic combined CHF, Takotsubo, CKD stage IIIb, anxiety, chronic respiratory failure, COPD present to the hospital because of cough and shortness of breath. Currently treated for COPD exacerbation. Actively on hospice at home.  Currently revoked it.  Assessment and Plan: Acute on chronic respiratory failure with hypoxia  COPD exacerbation Presents with shortness of breath requiring more oxygen his baseline. Suspect combination of progressing of the COPD as well as mild exacerbation of COPD. Uses 3 LPM at home. Chest x-ray unremarkable. Initially admitted had concerns about possibility for PE although patient's symptoms does not appear to be consistent with PE. D-dimer is only minimally elevated. For now no further workup necessary. Continue with COPD with steroids DuoNebs and antibiotic. Continue BiPAP as needed.  Patient brought in home BiPAP. Lasix  also daily.  Acute on chronic diastolic CHF Clinically appears euvolemic although symptom wise patient reports progressive shortness of breath with leg swelling. Treat with IV Lasix  and monitor. Echocardiogram noted EF to be 50 to 55% with indeterminate diastolic parameters when checked 08/07/2023. Strict I&Os and daily weights  Hypertension Blood pressures currently soft. Continue current regimen   Normocytic anemia Hemoglobin noted to be 11.8 which appears to priors.  No reports of bleeding Continue to monitor   Chronic kidney disease stage IIIb Creatinine baseline appears to be around 1.41.5. Monitor. Mild worsening was seen requiring IV fluid.   Severe anxiety and depression Panic attacks Switch Xanax  to Ativan . Continue Paxil  at home dose. Added scheduled BuSpar.  Also on scheduled Ativan  and morphine . Goal is for comfort.    Hyperlipidemia  Continue Crestor    OSA Continue BiPAP nightly after able to be weaned off  Goals of care conversation. Patient is on hospice at home. Currently has revoked her hospice for hospitalization due to respiratory distress. At present suspect that she is suffering from progression of the disease. Recommended her to consider symptom control as a priority rather than seeking treatment due to progression and poorly controlled symptoms with anxiety. Patient verbalizes understanding. Palliative care consult further assistance after our conversation. Husband verbalizes understanding of goal for do not hospitalize.  Obesity Class 1 Body mass index is 31.02 kg/m.  Placing the pt at higher risk of poor outcomes.    Subjective: No nausea no vomiting ongoing shortness of breath.  Was on BiPAP again this morning.  Physical Exam: Basal crackles. S1-S2 present Bowel sound present Trace edema.  Data Reviewed: I have Reviewed nursing notes, Vitals, and Lab results. Discussed with palliative care. CBC BMP ordered and reviewed.  Disposition: Status is: Inpatient Remains inpatient appropriate because: Monitor for improvement volume status and clarity goal of care  enoxaparin  (LOVENOX ) injection 40 mg Start: 02/15/24 1000   Family Communication: Family at bedside Level of care: Progressive   Vitals:   02/16/24 0748 02/16/24 1046 02/16/24 1200 02/16/24 1601  BP:  127/73  (!) 141/69  Pulse: 65 76 73 71  Resp: 20 (!) 21 19 20   Temp:  99 F (37.2 C)  98.9 F (37.2 C)  TempSrc:  Axillary  Axillary  SpO2: 100% 99% 100% 94%  Weight:      Height:         Author: Yetta Blanch, MD 02/16/2024 6:32 PM  Please look on [email or web address] to find out who is on call.

## 2024-02-16 NOTE — Progress Notes (Signed)
 Pt on BIPAP at this time.   02/16/24 2013  BiPAP/CPAP/SIPAP  BiPAP/CPAP/SIPAP Pt Type Adult  BiPAP/CPAP/SIPAP SERVO  Mask Type Nasal mask  Dentures removed? Yes - Placed in denture cup  Mask Size Medium  Set Rate 16 breaths/min  Respiratory Rate 19 breaths/min  IPAP 15 cmH20  EPAP 5 cmH2O  Pressure Support 5 cmH20  PEEP 5 cmH20  FiO2 (%) 40 %  Flow Rate 0 lpm  Minute Ventilation 11.2  Leak 46  Peak Inspiratory Pressure (PIP) 14  Tidal Volume (Vt) 401  Patient Home Machine Yes  Safety Check Completed by RT for Home Unit Yes, no issues noted  Patient Home Mask Yes  Patient Home Tubing Yes  Auto Titrate No  Press High Alarm 30 cmH2O  Press Low Alarm 2 cmH2O  Nasal massage performed Yes  CPAP/SIPAP surface wiped down Yes  Device Plugged into RED Power Outlet Yes  Oxygen Percent 50 %

## 2024-02-17 DIAGNOSIS — I5A Non-ischemic myocardial injury (non-traumatic): Secondary | ICD-10-CM | POA: Diagnosis not present

## 2024-02-17 DIAGNOSIS — Z515 Encounter for palliative care: Secondary | ICD-10-CM

## 2024-02-17 DIAGNOSIS — R0600 Dyspnea, unspecified: Secondary | ICD-10-CM | POA: Diagnosis not present

## 2024-02-17 DIAGNOSIS — F410 Panic disorder [episodic paroxysmal anxiety] without agoraphobia: Secondary | ICD-10-CM | POA: Diagnosis not present

## 2024-02-17 DIAGNOSIS — J441 Chronic obstructive pulmonary disease with (acute) exacerbation: Secondary | ICD-10-CM | POA: Diagnosis not present

## 2024-02-17 LAB — CBC
HCT: 35.3 % — ABNORMAL LOW (ref 36.0–46.0)
Hemoglobin: 11.1 g/dL — ABNORMAL LOW (ref 12.0–15.0)
MCH: 29.8 pg (ref 26.0–34.0)
MCHC: 31.4 g/dL (ref 30.0–36.0)
MCV: 94.6 fL (ref 80.0–100.0)
Platelets: 302 K/uL (ref 150–400)
RBC: 3.73 MIL/uL — ABNORMAL LOW (ref 3.87–5.11)
RDW: 15.9 % — ABNORMAL HIGH (ref 11.5–15.5)
WBC: 16.7 K/uL — ABNORMAL HIGH (ref 4.0–10.5)
nRBC: 0.1 % (ref 0.0–0.2)

## 2024-02-17 LAB — BASIC METABOLIC PANEL WITH GFR
Anion gap: 13 (ref 5–15)
BUN: 47 mg/dL — ABNORMAL HIGH (ref 8–23)
CO2: 32 mmol/L (ref 22–32)
Calcium: 9.8 mg/dL (ref 8.9–10.3)
Chloride: 96 mmol/L — ABNORMAL LOW (ref 98–111)
Creatinine, Ser: 1.2 mg/dL — ABNORMAL HIGH (ref 0.44–1.00)
GFR, Estimated: 49 mL/min — ABNORMAL LOW (ref >60)
Glucose, Bld: 141 mg/dL — ABNORMAL HIGH (ref 70–99)
Potassium: 4.1 mmol/L (ref 3.5–5.1)
Sodium: 141 mmol/L (ref 135–145)

## 2024-02-17 LAB — MAGNESIUM: Magnesium: 2.2 mg/dL (ref 1.7–2.4)

## 2024-02-17 MED ORDER — FUROSEMIDE 40 MG PO TABS
40.0000 mg | ORAL_TABLET | Freq: Every day | ORAL | Status: DC
  Administered 2024-02-17 – 2024-02-18 (×2): 40 mg via ORAL
  Filled 2024-02-17 (×2): qty 1

## 2024-02-17 MED ORDER — LORAZEPAM 0.5 MG PO TABS
0.5000 mg | ORAL_TABLET | Freq: Four times a day (QID) | ORAL | Status: DC
  Administered 2024-02-17 – 2024-02-18 (×4): 0.5 mg via ORAL
  Filled 2024-02-17 (×4): qty 1

## 2024-02-17 MED ORDER — MORPHINE SULFATE (CONCENTRATE) 10 MG /0.5 ML PO SOLN
5.0000 mg | Freq: Four times a day (QID) | ORAL | Status: DC
  Administered 2024-02-17 – 2024-02-18 (×4): 5 mg via ORAL
  Filled 2024-02-17 (×4): qty 0.5

## 2024-02-17 MED ORDER — ALBUTEROL SULFATE HFA 108 (90 BASE) MCG/ACT IN AERS
2.0000 | INHALATION_SPRAY | RESPIRATORY_TRACT | Status: DC | PRN
  Administered 2024-02-18: 2 via RESPIRATORY_TRACT
  Filled 2024-02-17 (×2): qty 6.7

## 2024-02-17 NOTE — Progress Notes (Signed)
 [facility] Progress Note Patient: Kim [name] FMW:[id number] DOB: 04-08-1955  DOA: 02/13/2024 DOS: the patient was seen and examined on 02/17/2024  Brief Hospital Course: PMH of chronic combined CHF, Takotsubo, CKD stage IIIb, anxiety, chronic respiratory failure, COPD present to the hospital because of cough and shortness of breath. Currently treated for COPD exacerbation. Actively on hospice at home.  Currently revoked it.  Assessment and Plan: Acute on chronic respiratory failure with hypoxia  COPD exacerbation Presents with shortness of breath requiring more oxygen his baseline. Suspect combination of progressing of the COPD as well as mild exacerbation of COPD. Uses 3 LPM at home. Chest x-ray unremarkable. No concern for PE. Continue with COPD with steroids DuoNebs and antibiotic. Continue BiPAP as needed.  Patient brought in home BiPAP. Lasix  also daily.  Acute on chronic diastolic CHF Clinically appears euvolemic although symptom wise patient reports progressive shortness of breath with leg swelling. Treat with IV Lasix  and monitor. Echocardiogram noted EF to be 50 to 55% with indeterminate diastolic parameters when checked 08/07/2023. Strict I&Os and daily weights  Hypertension Blood pressures currently soft. Continue current regimen   Normocytic anemia Hemoglobin noted to be 11.8 which appears to priors.  No reports of bleeding Continue to monitor   Chronic kidney disease stage IIIb Creatinine baseline appears to be around 1.41.5. Monitor. Mild worsening was seen requiring IV fluid.   Severe anxiety and depression Panic attacks Switch Xanax  to Ativan . Continue Paxil  at home dose. On scheduled BuSpar Ativan  and morphine . Goal is for comfort.   Hyperlipidemia  Continue Crestor    OSA Continue BiPAP nightly after able to be weaned off  Goals of care conversation. Patient is on hospice at home. Currently has revoked her hospice for hospitalization  due to respiratory distress. At present suspect that she is suffering from progression of the disease. Recommended her to consider symptom control as a priority rather than seeking treatment due to progression and poorly controlled symptoms with anxiety. Patient verbalizes understanding. Palliative care consult further assistance after our conversation. Husband verbalizes understanding of goal for do not hospitalize.  Obesity Class 1 Body mass index is 31.02 kg/m.  Placing the pt at higher risk of poor outcomes.    Subjective: Sleepy.  No nausea no vomiting no fever no chills.  No events overnight other than panic attack this last night.  Physical Exam: Basal crackles. S1-S2 present No wheezing. Bowel sounds are No edema.  Data Reviewed: I have Reviewed nursing notes, Vitals, and Lab results. Since last encounter, pertinent lab results CBC and BMP   .  Discussed with palliative care Disposition: Status is: Inpatient Monitor for improvement in respiratory status.  enoxaparin  (LOVENOX ) injection 40 mg Start: 02/15/24 1000   Family Communication: Husband at bedside Level of care: Progressive   Vitals:   02/16/24 2325 02/17/24 0730 02/17/24 1150 02/17/24 1555  BP: (!) 177/71 (!) 154/76 111/63 128/71  Pulse: 99 84 73 72  Resp:  (!) 26 18 18   Temp:  97.6 F (36.4 C) 98.4 F (36.9 C) 98.2 F (36.8 C)  TempSrc:  Oral Axillary Axillary  SpO2:  100% 96% 95%  Weight:      Height:         Author: Yetta Blanch, MD 02/17/2024 6:46 PM  Please look on [email or web address] to find out who is on call.

## 2024-02-17 NOTE — Progress Notes (Signed)
 Daily Progress Note   Kim Kim [name] Name: Kim Kim [name]       Date: 02/17/2024 DOB: April 21, 1955  Age: 68 y.o. MRN#: [id number] Attending Physician: Tobie Yetta HERO, MD Primary Care Physician: Merna Huxley, NP Admit Date: 02/13/2024 Length of Stay: 3 days  Reason for Consultation/Follow-up: Establishing goals of care and Non pain symptom management  Subjective:  Subjective: EMR reviewed including review of notes from hospitalist, respiratory therapy, bedside nursing.    I saw and examined Kim Kim [name] with Kim Kim [name] at the bedside as well.  Kim [name] initially reported that Kim Kim [name] had a good night, however Kim Kim [name] reports that Kim Kim [name] had a terrible night.  Further discussion revealed that Kim Kim [name] did do okay overnight, but this morning Kim Kim [name] had a panic attack shortly before Kim Kim [name] was due for Kim Kim [name] next scheduled Ativan .  We discussed regarding continued panic attacks and shortness of breath.  Kim Kim [name] tells me that Kim Kim [name] does not actually sleep overnight at night and with this in mind we discussed plan to change from 3 times a day scheduled medication to every 6 hours around-the-clock as this will hopefully provide better coverage to avoid situation like this morning where Kim Kim [name] had a panic attack just as Kim Kim [name] was due for another dose of medication.  Kim Kim [name] also reports that Kim Kim [name] is frustrated that Kim Kim [name] cannot receive as needed medication close to the time Kim Kim [name] gets Kim Kim [name] scheduled medicine.  Discussed that both of these doses are low enough that I would be comfortable with Kim Kim [name] receiving them as soon as needed with regard to the timing.  Husband also reports that in the past Kim Kim [name] has had an inhaler at the bedside which allows Kim Kim [name] to do Kim Kim [name] own rescue whenever Kim Kim [name] needs it rather than needing to call for a nebulizer to be brought.  He feels this is very beneficial for Kim Kim [name] as the delay between Kim Kim [name] starting to feel symptomatic and intervention is able to be completed is when panic attacks set in.  Discussed again regarding  hospice services and Kim Kim [name] remains invested in plan to return home once symptoms are managed.  Tells me Kim [name] that Kim Kim [name] does not feel ready at this point, but is hopeful with further medication adjustments Kim [name] that Kim Kim [name] can transition home in the next day or 2.   Review of Systems Dyspnea and anxiety Objective:   Vital Signs:  BP 111/63 (BP Location: Left Arm)   Pulse 73   Temp 98.4 F (36.9 C) (Axillary)   Resp 18   Ht 5' 1.5 (1.562 m)   Wt 75.7 kg   SpO2 96%   BMI 31.02 kg/m   Physical Exam: General: Minimal respiratory distress, alert Eyes: conjunctiva clear, anicteric sclera HENT: normocephalic, atraumatic, moist mucous membranes, nasal cannula in place Cardiovascular: RRR, no edema in LE b/l Respiratory: Mildly increased work of breathing noted, diminished throughout Extremities: 1+ edema Skin: no rashes or lesions on visible skin Neuro: A&Ox4, following commands easily Psych: appropriately answers all questions but seems anxious  Imaging: @IMAGES @  I personally reviewed recent imaging.   Assessment & Plan:   Assessment: 68 y.o. 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 Kim Kim [name]'s exacerbation was not adequately managed in time at home and resulted in Kim Kim [name] husband calling emergency services before they were able to call home hospice service for guidance. Kim Kim [name] is a hospice Kim Kim [name] with [facility] and they are aware of Kim Kim [name] hospitalization.    Recommendations/Plan: # Complex  medical decision making/goals of care:  - Goal to return home with hospice services when ready for discharge.  - Discussed with hospice attending on 11/11 regarding symptom management and plan of care.  Recommending consideration for developing an action plan for when Kim Kim [name] has panic attacks at home, as this seems to be when Kim Kim [name]'s husband calls 911.  -  Code Status: Limited: Do not attempt resuscitation (DNR)  -DNR-LIMITED -Do Not Intubate/DNI   Prognosis: < 6 months  # Symptom management: Kim Kim [name] is receiving these palliative interventions for symptom management with an intent to improve quality of life.   - Dyspnea: Increase scheduled morphine  to 5 mg p.o. every 6 hours.  Continue oxycodone  as needed for breakthrough with consideration to rotate breakthrough to morphine  as well as long as Kim Kim [name] tolerates morphine  and GFR stable.  -Anxiety continue Paxil  buspirone.   With continued panic attacks this morning will increase scheduled lorazepam  to 0.5 mg every 6 hours around-the-clock.  Kim Kim [name] has additional breakthrough Ativan  as needed as well.  # Psychosocial Support:  -family including husband and son  # Discharge Planning: Home with Hospice  -  Discussed with: Kim Kim [name], family, Dr. Tobie  Thank you for allowing the palliative care team to participate in the care Kim Kim [name].  Kim Meissner, MD Palliative Care Provider PMT # [phone]  If Kim Kim [name] remains symptomatic despite maximum doses, please call PMT at [phone] between 0700 and 1900. Outside of these hours, please call attending, as PMT does not have night coverage.   I personally spent a total of 40 minutes in the care of the Kim Kim [name] Kim [name] including preparing to see the Kim Kim [name], getting/reviewing separately obtained history, performing a medically appropriate exam/evaluation, counseling and educating, referring and communicating with other health care professionals, and documenting clinical information in the EHR.

## 2024-02-18 DIAGNOSIS — J441 Chronic obstructive pulmonary disease with (acute) exacerbation: Secondary | ICD-10-CM | POA: Diagnosis not present

## 2024-02-18 DIAGNOSIS — I5A Non-ischemic myocardial injury (non-traumatic): Secondary | ICD-10-CM | POA: Diagnosis not present

## 2024-02-18 LAB — BASIC METABOLIC PANEL WITH GFR
Anion gap: 16 — ABNORMAL HIGH (ref 5–15)
BUN: 45 mg/dL — ABNORMAL HIGH (ref 8–23)
CO2: 30 mmol/L (ref 22–32)
Calcium: 9.6 mg/dL (ref 8.9–10.3)
Chloride: 93 mmol/L — ABNORMAL LOW (ref 98–111)
Creatinine, Ser: 1.32 mg/dL — ABNORMAL HIGH (ref 0.44–1.00)
GFR, Estimated: 43 mL/min — ABNORMAL LOW (ref >60)
Glucose, Bld: 245 mg/dL — ABNORMAL HIGH (ref 70–99)
Potassium: 4.3 mmol/L (ref 3.5–5.1)
Sodium: 139 mmol/L (ref 135–145)

## 2024-02-18 LAB — CBC
HCT: 34.3 % — ABNORMAL LOW (ref 36.0–46.0)
Hemoglobin: 11 g/dL — ABNORMAL LOW (ref 12.0–15.0)
MCH: 30.1 pg (ref 26.0–34.0)
MCHC: 32.1 g/dL (ref 30.0–36.0)
MCV: 93.7 fL (ref 80.0–100.0)
Platelets: 287 K/uL (ref 150–400)
RBC: 3.66 MIL/uL — ABNORMAL LOW (ref 3.87–5.11)
RDW: 15.5 % (ref 11.5–15.5)
WBC: 12.6 K/uL — ABNORMAL HIGH (ref 4.0–10.5)
nRBC: 0.2 % (ref 0.0–0.2)

## 2024-02-18 LAB — MAGNESIUM: Magnesium: 2.1 mg/dL (ref 1.7–2.4)

## 2024-02-18 MED ORDER — METOPROLOL TARTRATE 25 MG PO TABS: 25.0000 mg | ORAL_TABLET | Freq: Two times a day (BID) | ORAL | 1 refills | Status: AC

## 2024-02-18 MED ORDER — BENZONATATE 100 MG PO CAPS: 100.0000 mg | ORAL_CAPSULE | Freq: Three times a day (TID) | ORAL | 0 refills | Status: AC | PRN

## 2024-02-18 MED ORDER — ENSURE PLUS HIGH PROTEIN PO LIQD: 237.0000 mL | Freq: Two times a day (BID) | ORAL | 2 refills | Status: AC

## 2024-02-18 MED ORDER — BUSPIRONE HCL 5 MG PO TABS: 5.0000 mg | ORAL_TABLET | Freq: Three times a day (TID) | ORAL | 1 refills | Status: AC

## 2024-02-18 MED ORDER — PREDNISONE 50 MG PO TABS: 50.0000 mg | ORAL_TABLET | Freq: Every day | ORAL | 0 refills | Status: AC

## 2024-02-18 MED ORDER — DOXYCYCLINE HYCLATE 100 MG PO TABS: 100.0000 mg | ORAL_TABLET | Freq: Two times a day (BID) | ORAL | 0 refills | Status: AC

## 2024-02-18 NOTE — TOC Transition Note (Addendum)
 Transition of Care [facility]) - Discharge Note   Patient Details  Name: Kim [name] MRN: [id number] Date of Birth: 11/21/1955  [facility] CM/SW Contact:  Waddell Barnie Rama, RN Phone Number: 02/18/2024, 10:42 AM   Clinical Narrative:    For dc home with hospice with [facility], Ptar scheduled.  NCM notified [facility] and family member is in the room at bedside. NCM notified spouse also of dc .   Final next level of care: Home w Hospice Care Barriers to Discharge: Continued Medical Work up   Patient Goals and CMS Choice Patient states their goals for this hospitalization and ongoing recovery are:: return home with [facility] CMS Medicare.gov Compare Post Acute Care list provided to:: Patient Choice offered to / list presented to : Patient      Discharge Placement                       Discharge Plan and Services Additional resources added to the After Visit Summary for     Discharge Planning Services: CM Consult Post Acute Care Choice: Hospice          DME Arranged: N/A DME Agency: NA       HH Arranged: RN HH Agency:  (medi home hospice) Date HH Agency Contacted: 02/15/24 Time HH Agency Contacted: 1530 Representative spoke with at [facility] Agency: Cassondra  Social Drivers of Health (SDOH) Interventions SDOH Screenings   Food Insecurity: No Food Insecurity (02/13/2024)  Housing: Low Risk  (02/13/2024)  Transportation Needs: No Transportation Needs (02/13/2024)  Utilities: Not At Risk (02/13/2024)  Alcohol  Screen: Low Risk  (03/22/2020)  Depression (PHQ2-9): Medium Risk (09/10/2021)  Financial Resource Strain: High Risk (09/10/2021)  Physical Activity: Inactive (03/25/2021)  Social Connections: Moderately Isolated (02/13/2024)  Stress: No Stress Concern Present (09/10/2021)  Tobacco Use: Medium Risk (02/13/2024)     Readmission Risk Interventions    02/15/2024    3:27 PM 11/17/2023   11:05 AM 05/17/2023   12:49 PM  Readmission  Risk Prevention Plan  Transportation Screening Complete Complete Complete  PCP or Specialist Appt within 3-5 Days  Complete Complete  HRI or Home Care Consult  Complete Complete  Social Work Consult for Recovery Care Planning/Counseling   Complete  Palliative Care Screening  Not Applicable Not Applicable  Medication Review Oceanographer) Complete Complete Complete  HRI or Home Care Consult Complete    Palliative Care Screening Complete    Skilled Nursing Facility Not Applicable

## 2024-02-18 NOTE — Plan of Care (Signed)
  Problem: Clinical Measurements: Goal: Will remain free from infection Outcome: Progressing Goal: Cardiovascular complication will be avoided Outcome: Progressing   Problem: Coping: Goal: Level of anxiety will decrease Outcome: Progressing   Problem: Elimination: Goal: Will not experience complications related to urinary retention Outcome: Progressing   Problem: Safety: Goal: Ability to remain free from injury will improve Outcome: Progressing   Problem: Respiratory: Goal: Ability to maintain a clear airway will improve Outcome: Progressing

## 2024-02-18 NOTE — Progress Notes (Signed)
 Discharge instructions provided to the patient and her husband, AVS placed in d/c packet. PIV removed, CCMD notified, patient will be transport pt to her home.

## 2024-02-18 NOTE — Care Management Important Message (Signed)
 Important Message  Patient Details  Name: MICHALA DEBLANC MRN: [id number] Date of Birth: 1955/06/15   Important Message Given:  Yes - Medicare IM     Vonzell Arrie Sharps 02/18/2024, 12:46 PM

## 2024-02-18 NOTE — Discharge Summary (Signed)
 Physician Discharge Summary   Patient: Kim [name] MRN: [id number] DOB: 01-28-1956  Admit date:     02/13/2024  Discharge date: 02/18/24  Discharge Physician: Amaryllis Dare   PCP: Merna Huxley, NP   Recommendations at discharge:  Patient is planning to resume her hospice services Follow-up with primary care provider  Discharge Diagnoses: Active Problems:   COPD exacerbation (HCC)   Myocardial injury   Hospital Course: PMH of chronic combined CHF, Takotsubo, CKD stage IIIb, anxiety, chronic respiratory failure, COPD present to the hospital because of cough and shortness of breath. Currently treated for COPD exacerbation. Actively on hospice at home.  Currently revoked it.  Palliative care was consulted and patient decided to reinstate her hospice services on discharge.  Explained to husband that hospice need to help with her symptoms to avoid unnecessary hospitalizations.  Assessment and Plan: Acute on chronic respiratory failure with hypoxia  COPD exacerbation Presents with shortness of breath requiring more oxygen his baseline. Suspect combination of progressing of the COPD as well as mild exacerbation of COPD. Uses 3 LPM at home. Chest x-ray unremarkable. No concern for PE. Continue with COPD with steroids DuoNebs and antibiotic. Continue BiPAP as needed.  Patient brought in home BiPAP. Lasix  also daily. Patient will continue home medications and was given 2 more doses of doxycycline  and 2 days of prednisone  on discharge.  Acute on chronic diastolic CHF Clinically appears euvolemic although symptom wise patient reports progressive shortness of breath with leg swelling. Treat with IV Lasix  and monitor. Echocardiogram noted EF to be 50 to 55% with indeterminate diastolic parameters when checked 08/07/2023. Strict I&Os and daily weights  Hypertension Blood pressures within goal Continue current regimen   Normocytic anemia Hemoglobin stable.  No reports of  bleeding Continue to monitor   Chronic kidney disease stage IIIb Creatinine baseline appears to be around 1.41.5. Monitor. Mild worsening was seen requiring IV fluid.   Severe anxiety and depression Panic attacks Switch Xanax  to Ativan . Continue Paxil  at home dose. On scheduled BuSpar Ativan  and morphine . Goal is for comfort.   Hyperlipidemia  Continue Crestor    OSA Continue BiPAP nightly after able to be weaned off  Goals of care conversation. Patient is on hospice at home. Currently has revoked her hospice for hospitalization due to respiratory distress. At present suspect that she is suffering from progression of the disease. Recommended her to consider symptom control as a priority rather than seeking treatment due to progression and poorly controlled symptoms with anxiety. Patient verbalizes understanding. Palliative care consult further assistance after our conversation. Husband verbalizes understanding of goal for do not hospitalize.  Obesity Class 1 Body mass index is 31.02 kg/m.  Placing the pt at higher risk of poor outcomes.       Pain control - [place]  Controlled Substance Reporting System database was reviewed. and patient was instructed, not to drive, operate heavy machinery, perform activities at heights, swimming or participation in water  activities or provide baby-sitting services while on Pain, Sleep and Anxiety Medications; until their outpatient Physician has advised to do so again. Also recommended to not to take more than prescribed Pain, Sleep and Anxiety Medications.  Consultants: Palliative care Procedures performed: None Disposition: Hospice care Diet recommendation:  Discharge Diet Orders (From admission, onward)     Start     Ordered   02/18/24 0000  Diet - low sodium heart healthy        02/18/24 1027  Cardiac diet DISCHARGE MEDICATION: Allergies as of 02/18/2024       Reactions   Tape Other (See Comments)   Skin  tears easily, peels skin off   Daliresp  [roflumilast ] Other (See Comments)   Skin peeling   Penicillins Hives   Cozaar  [losartan ] Other (See Comments)   Possible cause of severe hypotension   Dupixent  [dupilumab ] Diarrhea, Swelling, Other (See Comments), Cough   Facial swelling Headache   Latex Other (See Comments)   Peels skin off   Omnicef  [cefdinir ] Rash   Statins Other (See Comments)   Myalgia   Torsemide  Other (See Comments)   Severe stomach cramps and all over   Trazodone  And Nefazodone Other (See Comments)   Patient stated the last 2 times she took this medication gave her very bad migraine so she cut them out completely.   Zestril  [lisinopril ] Cough        Medication List     TAKE these medications    albuterol  108 (90 Base) MCG/ACT inhaler Commonly known as: Ventolin  HFA INHALE 2 PUFFS INTO THE LUNGS EVERY 4 TO 6 HOURS AS NEEDED FOR SHORTNESS OF BREATH OR WHEEZING What changed:  how much to take how to take this when to take this reasons to take this additional instructions   ALPRAZolam  1 MG tablet Commonly known as: XANAX  TAKE 1 TABLET(1 MG) BY MOUTH THREE TIMES DAILY What changed: See the new instructions.   arformoterol  15 MCG/2ML Nebu Commonly known as: BROVANA  Inhale 2 mLs (15 mcg total) by nebulization 2 (two) times daily.   aspirin  EC 81 MG tablet Take 1 tablet (81 mg total) by mouth daily. What changed: when to take this   benzonatate  100 MG capsule Commonly known as: TESSALON  Take 1 capsule (100 mg total) by mouth 3 (three) times daily as needed for cough.   bisacodyl  5 MG EC tablet Generic drug: bisacodyl  Take 5 mg by mouth daily as needed for moderate constipation.   budesonide  0.25 MG/2ML nebulizer solution Commonly known as: PULMICORT  Take 2 mLs (0.25 mg total) by nebulization 2 (two) times daily. What changed:  when to take this reasons to take this   busPIRone 5 MG tablet Commonly known as: BUSPAR Take 1 tablet (5 mg total)  by mouth 3 (three) times daily.   butalbital -acetaminophen -caffeine  50-325-40 MG tablet Commonly known as: FIORICET  TAKE 1 TABLET BY MOUTH EVERY 4 HOURS AS NEEDED FOR HEADACHE   cetirizine 10 MG tablet Commonly known as: ZYRTEC Take 10 mg by mouth in the morning.   doxycycline  100 MG tablet Commonly known as: VIBRA -TABS Take 1 tablet (100 mg total) by mouth every 12 (twelve) hours for 2 doses.   feeding supplement Liqd Take 237 mLs by mouth 2 (two) times daily between meals.   furosemide  40 MG tablet Commonly known as: LASIX  Take 1 tablet (40 mg total) by mouth at bedtime.   guaiFENesin -dextromethorphan  100-10 MG/5ML syrup Commonly known as: ROBITUSSIN DM Take 5 mLs by mouth every 4 (four) hours as needed for cough (chest congestion).   HYDROcodone -acetaminophen  10-325 MG tablet Commonly known as: NORCO Take 1 tablet by mouth every 6 (six) hours as needed for moderate pain (pain score 4-6) or severe pain (pain score 7-10).   hydrocortisone  cream 1 % Apply 1 Application topically daily as needed for itching.   ipratropium-albuterol  0.5-2.5 (3) MG/3ML Soln Commonly known as: DUONEB Take 3 mLs by nebulization every 4 (four) hours as needed. What changed: when to take this   LORazepam  1 MG  tablet Commonly known as: ATIVAN  Take 1 mg by mouth every 4 (four) hours as needed for anxiety.   magnesium  oxide 400 (240 Mg) MG tablet Commonly known as: MAG-OX Take 1 tablet by mouth daily.   meclizine  25 MG tablet Commonly known as: ANTIVERT  Take 25 mg by mouth every 8 (eight) hours as needed for dizziness.   metoprolol  tartrate 25 MG tablet Commonly known as: LOPRESSOR  Take 1 tablet (25 mg total) by mouth 2 (two) times daily.   morphine  CONCENTRATE 10 mg / 0.5 ml concentrated solution Take 0.5 mLs by mouth as needed for severe pain (pain score 7-10).   Mucinex  Maximum Strength 1200 MG Tb12 Generic drug: Guaifenesin  Take 1,200 mg by mouth every 12 (twelve) hours.    nitroGLYCERIN  0.4 MG SL tablet Commonly known as: NITROSTAT  Place 1 tablet (0.4 mg total) under the tongue every 5 (five) minutes as needed for chest pain.   nystatin  100000 UNIT/ML suspension Commonly known as: MYCOSTATIN  Take 5 mLs (500,000 Units total) by mouth 4 (four) times daily. What changed:  when to take this reasons to take this   OXYGEN Inhale 2.5-3.5 L/min into the lungs continuous.   PARoxetine  40 MG tablet Commonly known as: PAXIL  Take 0.5 tablets (20 mg total) by mouth in the morning. TAKE 1 TABLET(40 MG) BY MOUTH EVERY MORNING What changed:  how much to take additional instructions   predniSONE  50 MG tablet Commonly known as: DELTASONE  Take 1 tablet (50 mg total) by mouth daily with breakfast for 2 days.   promethazine -dextromethorphan  6.25-15 MG/5ML syrup Commonly known as: PROMETHAZINE -DM Take 5 mLs by mouth every 6 (six) hours as needed for cough.   rosuvastatin  20 MG tablet Commonly known as: CRESTOR  Take 1 tablet (20 mg total) by mouth daily.   Senokot 8.6 MG tablet Generic drug: senna Take 1 tablet by mouth 2 (two) times daily as needed for constipation.   Vitamin B-12 5000 MCG Subl Place 5,000 mcg under the tongue at bedtime.   Vitamin D3 125 MCG (5000 UT) Caps Take 5,000 Units by mouth at bedtime.        Follow-up Information     [facility] Follow up.   Why: Agency will call you to make apts Contact information: [address] [place] [address] [phone]         Merna Huxley, NP. Schedule an appointment as soon as possible for a visit.   Specialty: Family Medicine Contact information: [address] [place] [address] [phone]                Discharge Exam: Fredricka Weights   02/13/24 2222 02/14/24 1239 02/15/24 0547  Weight: 72.2 kg 72.6 kg 75.7 kg   General. Frail elderly lady, in no acute distress. Pulmonary.  Lungs clear bilaterally, normal respiratory  effort. CV.  Regular rate and rhythm, no JVD, rub or murmur. Abdomen.  Soft, nontender, nondistended, BS positive. CNS.  Alert and oriented .  No focal neurologic deficit. Extremities.  No edema, no cyanosis, pulses intact and symmetrical.  Condition at discharge: stable  The results of significant diagnostics from this hospitalization (including imaging, microbiology, ancillary and laboratory) are listed below for reference.   Imaging Studies: ECHOCARDIOGRAM COMPLETE Result Date: 02/14/2024    ECHOCARDIOGRAM REPORT   Patient Name:   Kim [name] Date of Exam: 02/14/2024 Medical Rec #:  [id number]       Height:       61.5 in Accession #:  [id number]      Weight:       160.1 lb Date of Birth:  01/14/1956      BSA:          1.729 m Patient Age:    68 years        BP:           204/111 mmHg Patient Gender: F               HR:           89 bpm. Exam Location:  Inpatient Procedure: 2D Echo, Color Doppler and Cardiac Doppler (Both Spectral and Color            Flow Doppler were utilized during procedure). Indications:    Dyspnea R06.00  History:        Patient has prior history of Echocardiogram examinations, most                 recent 08/07/2023. COPD.  Sonographer:    Tinnie Gosling RDCS Referring Phys: [id number] GILLIAN HERO [facility]  Sonographer Comments: Technically difficult study due to poor echo windows and patient is obese. Image acquisition challenging due to respiratory motion, Image acquisition challenging due to COPD and Image acquisition challenging due to patient body habitus. IMPRESSIONS  1. Left ventricular ejection fraction, by estimation, is 60 to 65%. The left ventricle has normal function. Left ventricular endocardial border not optimally defined to evaluate regional wall motion. Left ventricular diastolic parameters are indeterminate.  2. RV not well visualized, grossly appears normal in size and function. . Right ventricular systolic function was not well visualized. The right ventricular size  is not well visualized.  3. Mild to moderate pericardial effusion. The pericardial effusion is circumferential. There is no evidence of cardiac tamponade.  4. The mitral valve was not well visualized. No evidence of mitral valve regurgitation. No evidence of mitral stenosis.  5. The aortic valve was not well visualized. Aortic valve regurgitation is mild. No aortic stenosis is present.  6. The inferior vena cava is normal in size with greater than 50% respiratory variability, suggesting right atrial pressure of 3 mmHg.  7. Technically difficult study, limited visualization FINDINGS  Left Ventricle: Left ventricular ejection fraction, by estimation, is 60 to 65%. The left ventricle has normal function. Left ventricular endocardial border not optimally defined to evaluate regional wall motion. The left ventricular internal cavity size was normal in size. There is no left ventricular hypertrophy. Left ventricular diastolic parameters are indeterminate. Right Ventricle: RV not well visualized, grossly appears normal in size and function. The right ventricular size is not well visualized. Right vetricular wall thickness was not well visualized. Right ventricular systolic function was not well visualized. Left Atrium: Left atrial size was not well visualized. Right Atrium: Right atrial size was not well visualized. Pericardium: Mild to moderate pericardial effusion. The pericardial effusion is circumferential. There is no evidence of cardiac tamponade. Mitral Valve: The mitral valve was not well visualized. No evidence of mitral valve regurgitation. No evidence of mitral valve stenosis. Tricuspid Valve: The tricuspid valve is not well visualized. Tricuspid valve regurgitation is not demonstrated. No evidence of tricuspid stenosis. Aortic Valve: The aortic valve was not well visualized. Aortic valve regurgitation is mild. No aortic stenosis is present. Aortic valve mean gradient measures 3.0 mmHg. Aortic valve peak  gradient measures 7.8 mmHg. Pulmonic Valve: The pulmonic valve was not well visualized. Aorta: The aortic root was not well visualized. Venous: The inferior vena cava  is normal in size with greater than 50% respiratory variability, suggesting right atrial pressure of 3 mmHg. IAS/Shunts: No atrial level shunt detected by color flow Doppler.   Diastology LV e' lateral:   7.07 cm/s LV E/e' lateral: 10.2  RIGHT VENTRICLE         IVC TAPSE (M-mode): 2.3 cm  IVC diam: 1.80 cm AORTIC VALVE AV Vmax:           139.74 cm/s AV Vmean:          77.947 cm/s AV VTI:            0.175 m AV Peak Grad:      7.8 mmHg AV Mean Grad:      3.0 mmHg LVOT Vmax:         118.00 cm/s LVOT Vmean:        77.600 cm/s LVOT VTI:          0.205 m LVOT/AV VTI ratio: 1.17  AORTA Ao Root diam: 2.90 cm MITRAL VALVE MV Area (PHT): 5.20 cm    SHUNTS MV Decel Time: 146 msec    Systemic VTI: 0.20 m MV E velocity: 72.30 cm/s MV A velocity: 80.20 cm/s MV E/A ratio:  0.90 Dorn Ross MD Electronically signed by Dorn Ross MD Signature Date/Time: 02/14/2024/3:20:31 PM    Final    DG Chest Port 1 View Result Date: 02/14/2024 EXAM: 1 VIEW(S) XRAY OF THE CHEST 02/14/2024 01:39:00 PM COMPARISON: 02/13/2024 CLINICAL HISTORY: Acute respiratory distress FINDINGS: LUNGS AND PLEURA: Chronic coarsened interstitial markings. No pulmonary edema. Chronic right pleural thickening. No pleural effusions. No pneumothorax. HEART AND MEDIASTINUM: Atherosclerotic plaque noted. No acute abnormality of the cardiac and mediastinal silhouettes. BONES AND SOFT TISSUES: No acute osseous abnormality. IMPRESSION: 1. No acute cardiopulmonary process identified. Electronically signed by: Norman Gatlin MD 02/14/2024 02:00 PM EST RP Workstation: [id number]   DG Chest Port 1 View Result Date: 02/13/2024 EXAM: 1 VIEW(S) XRAY OF THE CHEST 02/13/2024 06:35:00 PM COMPARISON: 01/25/2024 CLINICAL HISTORY: dyspnea FINDINGS: LUNGS AND PLEURA: No focal pulmonary opacity. No pulmonary  edema. No pleural effusion. No pneumothorax. HEART AND MEDIASTINUM: No acute abnormality of the cardiac and mediastinal silhouettes. Atherosclerotic plaque. BONES AND SOFT TISSUES: No acute osseous abnormality. IMPRESSION: 1. No acute process. Electronically signed by: Norman Gatlin MD 02/13/2024 06:50 PM EST RP Workstation: [id number]   DG Chest Port 1 View Result Date: 01/25/2024 EXAM: 1 VIEW XRAY OF THE CHEST 01/25/2024 11:09:00 AM COMPARISON: Chest radiograph dated 11/06/2023. CLINICAL HISTORY: cough, respiratory distress. FINDINGS: LUNGS AND PLEURA: Mild blunting of the right costophrenic angle could reflect a small right pleural effusion. No focal pulmonary opacity. No pulmonary edema. No pneumothorax. HEART AND MEDIASTINUM: No acute abnormality of the cardiac and mediastinal silhouettes. Aortic atherosclerosis. BONES AND SOFT TISSUES: No acute osseous abnormality. IMPRESSION: 1. Mild blunting of the right costophrenic angle could reflect a small right pleural effusion. Electronically signed by: Harrietta Sherry MD 01/25/2024 11:52 AM EDT RP Workstation: [id number]    Microbiology: Results for orders placed or performed during the hospital encounter of 02/13/24  Resp panel by RT-PCR (RSV, Flu A&B, Covid) Anterior Nasal Swab     Status: None   Collection Time: 02/13/24  6:16 PM   Specimen: Anterior Nasal Swab  Result Value Ref Range Status   SARS Coronavirus 2 by RT PCR NEGATIVE NEGATIVE Final   Influenza A by PCR NEGATIVE NEGATIVE Final   Influenza B by PCR NEGATIVE NEGATIVE Final    Comment: (NOTE) The Xpert Xpress  SARS-CoV-2/FLU/RSV plus assay is intended as an aid in the diagnosis of influenza from Nasopharyngeal swab specimens and should not be used as a sole basis for treatment. Nasal washings and aspirates are unacceptable for Xpert Xpress SARS-CoV-2/FLU/RSV testing.  Fact Sheet for Patients: bloggercourse.com  Fact Sheet for Healthcare  Providers: seriousbroker.it  This test is not yet approved or cleared by the [place]  FDA and has been authorized for detection and/or diagnosis of SARS-CoV-2 by FDA under an Emergency Use Authorization (EUA). This EUA will remain in effect (meaning this test can be used) for the duration of the COVID-19 declaration under Section 564(b)(1) of the Act, 21 U.S.C. section 360bbb-3(b)(1), unless the authorization is terminated or revoked.     Resp Syncytial Virus by PCR NEGATIVE NEGATIVE Final    Comment: (NOTE) Fact Sheet for Patients: bloggercourse.com  Fact Sheet for Healthcare Providers: seriousbroker.it  This test is not yet approved or cleared by the [place]  FDA and has been authorized for detection and/or diagnosis of SARS-CoV-2 by FDA under an Emergency Use Authorization (EUA). This EUA will remain in effect (meaning this test can be used) for the duration of the COVID-19 declaration under Section 564(b)(1) of the Act, 21 U.S.C. section 360bbb-3(b)(1), unless the authorization is terminated or revoked.  Performed at [facility], [address] [place], [place] [address]     Labs: CBC: Recent Labs  Lab 02/13/24 1816 02/14/24 0103 02/15/24 0230 02/16/24 0653 02/17/24 1443 02/18/24 0658  WBC 19.3* 15.2* 18.0* 14.9* 16.7* 12.6*  NEUTROABS 12.4* 14.7*  --   --   --   --   HGB 11.8* 10.9* 10.6* 10.4* 11.1* 11.0*  HCT 38.7 36.1 35.1* 33.2* 35.3* 34.3*  MCV 97.7 97.8 98.3 95.7 94.6 93.7  PLT 305 282 272 257 302 287   Basic Metabolic Panel: Recent Labs  Lab 02/14/24 0103 02/14/24 1249 02/15/24 0230 02/16/24 0653 02/17/24 1443 02/18/24 0658  NA 145 144 140 144 141 139  K 4.0 4.3 4.6 4.8 4.1 4.3  CL 100 105 104 103 96* 93*  CO2 26 26 28 30  32 30  GLUCOSE 224* 103* 175* 178* 141* 245*  BUN 23 24* 36* 43* 47* 45*  CREATININE 1.57* 1.11* 1.31* 1.13* 1.20* 1.32*  CALCIUM   8.8* 8.8* 9.3 9.7 9.8 9.6  MG 2.6*  --  2.6* 2.2 2.2 2.1   Liver Function Tests: Recent Labs  Lab 02/13/24 1816 02/14/24 0103  AST 44* 43*  ALT 35 35  ALKPHOS 70 68  BILITOT 0.6 0.5  PROT 6.1* 5.9*  ALBUMIN 3.6 3.4*   CBG: No results for input(s): GLUCAP in the last 168 hours.  Discharge time spent: greater than 30 minutes.  This record has been created using Conservation officer, historic buildings. Errors have been sought and corrected,but may not always be located. Such creation errors do not reflect on the standard of care.   Signed: Amaryllis Dare, MD [facility] 02/18/2024

## 2024-02-19 ENCOUNTER — Telehealth: Payer: Self-pay | Admitting: *Deleted

## 2024-02-19 NOTE — Transitions of Care (Post Inpatient/ED Visit) (Signed)
   02/19/2024  Name: Kim [name] MRN: [id number] DOB: May 14, 1955  Today's TOC FU Call Status: Today's TOC FU Call Status:: Successful TOC FU Call Completed TOC FU Call Complete Date: 02/19/24   [facility] RN made outreach to verify receipt of hospice referral and start of care. RN confirmed that patient is actively enrolled in hospice program. Hospice Agency:[facility]  RN spoke with: [facility] Care Date:02/18/2024 that evening No further interventions at this time.      Cathlean Headland BSN RN [facility] Health Care Management Coordinator [email or web address] Direct Dial: [phone]  Fax: [phone] Website: [email or web address]

## 2024-02-23 ENCOUNTER — Encounter: Payer: Self-pay | Admitting: Acute Care

## 2024-03-11 ENCOUNTER — Emergency Department (HOSPITAL_COMMUNITY)

## 2024-03-11 ENCOUNTER — Other Ambulatory Visit: Payer: Self-pay

## 2024-03-11 ENCOUNTER — Emergency Department (HOSPITAL_COMMUNITY)
Admission: EM | Admit: 2024-03-11 | Discharge: 2024-03-12 | Disposition: A | Attending: Emergency Medicine | Admitting: Emergency Medicine

## 2024-03-11 DIAGNOSIS — Z91040 Latex allergy status: Secondary | ICD-10-CM | POA: Insufficient documentation

## 2024-03-11 DIAGNOSIS — E875 Hyperkalemia: Secondary | ICD-10-CM | POA: Diagnosis not present

## 2024-03-11 DIAGNOSIS — Z7982 Long term (current) use of aspirin: Secondary | ICD-10-CM | POA: Insufficient documentation

## 2024-03-11 DIAGNOSIS — J441 Chronic obstructive pulmonary disease with (acute) exacerbation: Secondary | ICD-10-CM | POA: Diagnosis not present

## 2024-03-11 DIAGNOSIS — R059 Cough, unspecified: Secondary | ICD-10-CM | POA: Diagnosis not present

## 2024-03-11 DIAGNOSIS — D72829 Elevated white blood cell count, unspecified: Secondary | ICD-10-CM | POA: Insufficient documentation

## 2024-03-11 DIAGNOSIS — R0602 Shortness of breath: Secondary | ICD-10-CM | POA: Diagnosis present

## 2024-03-11 LAB — I-STAT VENOUS BLOOD GAS, ED
Acid-Base Excess: 9 mmol/L — ABNORMAL HIGH (ref 0.0–2.0)
Bicarbonate: 35.1 mmol/L — ABNORMAL HIGH (ref 20.0–28.0)
Calcium, Ion: 1.04 mmol/L — ABNORMAL LOW (ref 1.15–1.40)
HCT: 37 % (ref 36.0–46.0)
Hemoglobin: 12.6 g/dL (ref 12.0–15.0)
O2 Saturation: 99 %
Potassium: 5.3 mmol/L — ABNORMAL HIGH (ref 3.5–5.1)
Sodium: 139 mmol/L (ref 135–145)
TCO2: 37 mmol/L — ABNORMAL HIGH (ref 22–32)
pCO2, Ven: 51 mmHg (ref 44–60)
pH, Ven: 7.445 — ABNORMAL HIGH (ref 7.25–7.43)
pO2, Ven: 121 mmHg — ABNORMAL HIGH (ref 32–45)

## 2024-03-11 LAB — CBC WITH DIFFERENTIAL/PLATELET
Abs Immature Granulocytes: 0.33 K/uL — ABNORMAL HIGH (ref 0.00–0.07)
Basophils Absolute: 0.1 K/uL (ref 0.0–0.1)
Basophils Relative: 1 %
Eosinophils Absolute: 0 K/uL (ref 0.0–0.5)
Eosinophils Relative: 0 %
HCT: 41 % (ref 36.0–46.0)
Hemoglobin: 12.2 g/dL (ref 12.0–15.0)
Immature Granulocytes: 3 %
Lymphocytes Relative: 8 %
Lymphs Abs: 1 K/uL (ref 0.7–4.0)
MCH: 29.8 pg (ref 26.0–34.0)
MCHC: 29.8 g/dL — ABNORMAL LOW (ref 30.0–36.0)
MCV: 100.2 fL — ABNORMAL HIGH (ref 80.0–100.0)
Monocytes Absolute: 0.6 K/uL (ref 0.1–1.0)
Monocytes Relative: 5 %
Neutro Abs: 11.2 K/uL — ABNORMAL HIGH (ref 1.7–7.7)
Neutrophils Relative %: 83 %
Platelets: 448 K/uL — ABNORMAL HIGH (ref 150–400)
RBC: 4.09 MIL/uL (ref 3.87–5.11)
RDW: 16.1 % — ABNORMAL HIGH (ref 11.5–15.5)
WBC: 13.2 K/uL — ABNORMAL HIGH (ref 4.0–10.5)
nRBC: 0.2 % (ref 0.0–0.2)

## 2024-03-11 LAB — COMPREHENSIVE METABOLIC PANEL WITH GFR
ALT: 20 U/L (ref 0–44)
AST: 23 U/L (ref 15–41)
Albumin: 3.8 g/dL (ref 3.5–5.0)
Alkaline Phosphatase: 76 U/L (ref 38–126)
Anion gap: 11 (ref 5–15)
BUN: 31 mg/dL — ABNORMAL HIGH (ref 8–23)
CO2: 32 mmol/L (ref 22–32)
Calcium: 9.4 mg/dL (ref 8.9–10.3)
Chloride: 101 mmol/L (ref 98–111)
Creatinine, Ser: 1.44 mg/dL — ABNORMAL HIGH (ref 0.44–1.00)
GFR, Estimated: 39 mL/min — ABNORMAL LOW (ref >60)
Glucose, Bld: 163 mg/dL — ABNORMAL HIGH (ref 70–99)
Potassium: 5.3 mmol/L — ABNORMAL HIGH (ref 3.5–5.1)
Sodium: 144 mmol/L (ref 135–145)
Total Bilirubin: 0.4 mg/dL (ref 0.0–1.2)
Total Protein: 6.7 g/dL (ref 6.5–8.1)

## 2024-03-11 LAB — BRAIN NATRIURETIC PEPTIDE: B Natriuretic Peptide: 296 pg/mL — ABNORMAL HIGH (ref 0.0–100.0)

## 2024-03-11 LAB — RESP PANEL BY RT-PCR (RSV, FLU A&B, COVID)  RVPGX2
Influenza A by PCR: NEGATIVE
Influenza B by PCR: NEGATIVE
Resp Syncytial Virus by PCR: NEGATIVE
SARS Coronavirus 2 by RT PCR: NEGATIVE

## 2024-03-11 LAB — TROPONIN I (HIGH SENSITIVITY): Troponin I (High Sensitivity): 36 ng/L — ABNORMAL HIGH (ref <18)

## 2024-03-11 MED ORDER — METHYLPREDNISOLONE NA SUC (PF) 125 MG IJ SOLR
125.0000 mg | Freq: Once | INTRAMUSCULAR | Status: DC
  Filled 2024-03-11: qty 2

## 2024-03-11 MED ORDER — DOXYCYCLINE HYCLATE 100 MG PO CAPS: 100.0000 mg | ORAL_CAPSULE | Freq: Two times a day (BID) | ORAL | 0 refills | Status: AC

## 2024-03-11 MED ORDER — METHYLPREDNISOLONE SODIUM SUCC 125 MG IJ SOLR
125.0000 mg | Freq: Once | INTRAMUSCULAR | Status: AC
  Administered 2024-03-11: 125 mg via INTRAVENOUS
  Filled 2024-03-11: qty 2

## 2024-03-11 MED ORDER — MAGNESIUM SULFATE 2 GM/50ML IV SOLN
2.0000 g | Freq: Once | INTRAVENOUS | Status: AC
  Administered 2024-03-11: 2 g via INTRAVENOUS
  Filled 2024-03-11: qty 50

## 2024-03-11 MED ORDER — IPRATROPIUM-ALBUTEROL 0.5-2.5 (3) MG/3ML IN SOLN
3.0000 mL | RESPIRATORY_TRACT | Status: AC
  Administered 2024-03-11: 3 mL via RESPIRATORY_TRACT
  Filled 2024-03-11: qty 9

## 2024-03-11 MED ORDER — ALBUTEROL SULFATE HFA 108 (90 BASE) MCG/ACT IN AERS
2.0000 | INHALATION_SPRAY | Freq: Once | RESPIRATORY_TRACT | Status: AC
  Administered 2024-03-11: 2 via RESPIRATORY_TRACT
  Filled 2024-03-11: qty 6.7

## 2024-03-11 NOTE — Discharge Instructions (Signed)
 Please continue to work with hospice and see if they can get you some different medications to help you that hopefully will have less side effects.  Please return anytime you are concerned.

## 2024-03-11 NOTE — ED Notes (Signed)
 [facility] presenting with patient from home.   Patient is SOB for the last 2 days, anxiety. Hospice patient. 2L baseline [place].  EMS vitals and interventions: BP 189/143 96 HR  98 O2 on 2L  14 RR

## 2024-03-11 NOTE — ED Provider Notes (Signed)
 [name] EMERGENCY DEPARTMENT AT [facility] Provider Note   CSN: [id number] Arrival date & time: 03/11/24  1621     Patient presents with: Shortness of Breath   Kim [name] is a 68 y.o. female.   68 yo F with a chief complaint of difficulty breathing she said this been going on for months.  Per EMS report worse over the past couple days.  Cough shortness of breath.  No reported fevers no reported chest pain.  Typically on 2-1/2 L oxygen at all times.  Required nonrebreather en route.        Prior to Admission medications   Medication Sig Start Date End Date Taking? Authorizing Provider  doxycycline  (VIBRAMYCIN ) 100 MG capsule Take 1 capsule (100 mg total) by mouth 2 (two) times daily. One po bid x 7 days 03/11/24  Yes Emil Share, DO  albuterol  (VENTOLIN  HFA) 108 (90 Base) MCG/ACT inhaler INHALE 2 PUFFS INTO THE LUNGS EVERY 4 TO 6 HOURS AS NEEDED FOR SHORTNESS OF BREATH OR WHEEZING Patient taking differently: Inhale 2 puffs into the lungs every 3 (three) hours as needed for wheezing or shortness of breath. 02/02/23   Jude Harden GAILS, MD  ALPRAZolam  (XANAX ) 1 MG tablet TAKE 1 TABLET(1 MG) BY MOUTH THREE TIMES DAILY Patient taking differently: Take 1 mg by mouth 2 (two) times daily as needed for anxiety or sleep. 06/18/23   Nafziger, Darleene, NP  arformoterol  (BROVANA ) 15 MCG/2ML NEBU Inhale 2 mLs (15 mcg total) by nebulization 2 (two) times daily. 11/20/23 03/05/24  Trixie Nilda HERO, MD  aspirin  EC 81 MG EC tablet Take 1 tablet (81 mg total) by mouth daily. Patient taking differently: Take 81 mg by mouth every Monday, Wednesday, and Friday. 07/08/15   Samtani, Jai-Gurmukh, MD  benzonatate  (TESSALON ) 100 MG capsule Take 1 capsule (100 mg total) by mouth 3 (three) times daily as needed for cough. 02/18/24   Caleen Qualia, MD  bisacodyl  5 MG EC tablet Take 5 mg by mouth daily as needed for moderate constipation.    [provider]  budesonide  (PULMICORT ) 0.25 MG/2ML  nebulizer solution Take 2 mLs (0.25 mg total) by nebulization 2 (two) times daily. Patient taking differently: Take 0.25 mg by nebulization 2 (two) times daily as needed (shortness of breath). 11/20/23 03/05/24  Gherghe, Costin M, MD  busPIRone  (BUSPAR ) 5 MG tablet Take 1 tablet (5 mg total) by mouth 3 (three) times daily. 02/18/24   Amin, Sumayya, MD  butalbital -acetaminophen -caffeine  (FIORICET ) 50-325-40 MG tablet TAKE 1 TABLET BY MOUTH EVERY 4 HOURS AS NEEDED FOR HEADACHE 11/25/23   Nafziger, Darleene, NP  cetirizine (ZYRTEC) 10 MG tablet Take 10 mg by mouth in the morning.    [provider]  Cholecalciferol  (VITAMIN D3) 125 MCG (5000 UT) CAPS Take 5,000 Units by mouth at bedtime.    [provider]  Cyanocobalamin  (VITAMIN B-12) 5000 MCG SUBL Place 5,000 mcg under the tongue at bedtime.    [provider]  feeding supplement (ENSURE PLUS HIGH PROTEIN) LIQD Take 237 mLs by mouth 2 (two) times daily between meals. 02/18/24   Amin, Sumayya, MD  furosemide  (LASIX ) 40 MG tablet Take 1 tablet (40 mg total) by mouth at bedtime. 08/14/23   Ghimire, Donalda HERO, MD  guaiFENesin -dextromethorphan  (ROBITUSSIN DM) 100-10 MG/5ML syrup Take 5 mLs by mouth every 4 (four) hours as needed for cough (chest congestion). 01/29/24   Leotis Bogus, MD  HYDROcodone -acetaminophen  (NORCO) 10-325 MG tablet Take 1 tablet by mouth every 6 (  six) hours as needed for moderate pain (pain score 4-6) or severe pain (pain score 7-10). 11/09/23   [provider]  hydrocortisone  cream 1 % Apply 1 Application topically daily as needed for itching. 07/15/23   [provider]  ipratropium-albuterol  (DUONEB) 0.5-2.5 (3) MG/3ML SOLN Take 3 mLs by nebulization every 4 (four) hours as needed. Patient taking differently: Take 3 mLs by nebulization in the morning, at noon, and at bedtime. 10/15/22 02/13/25  Amin, Ankit C, MD  LORazepam  (ATIVAN ) 1 MG tablet Take 1 mg by mouth every 4 (four) hours as needed for  anxiety. 12/02/23   [provider]  magnesium  oxide (MAG-OX) 400 (240 Mg) MG tablet Take 1 tablet by mouth daily. 01/04/24   [provider]  meclizine  (ANTIVERT ) 25 MG tablet Take 25 mg by mouth every 8 (eight) hours as needed for dizziness. 11/09/23   [provider]  metoprolol  tartrate (LOPRESSOR ) 25 MG tablet Take 1 tablet (25 mg total) by mouth 2 (two) times daily. 02/18/24   Amin, Sumayya, MD  Morphine  Sulfate (MORPHINE  CONCENTRATE) 10 mg / 0.5 ml concentrated solution Take 0.5 mLs by mouth as needed for severe pain (pain score 7-10).    [provider]  MUCINEX  MAXIMUM STRENGTH 1200 MG TB12 Take 1,200 mg by mouth every 12 (twelve) hours.    [provider]  nitroGLYCERIN  (NITROSTAT ) 0.4 MG SL tablet Place 1 tablet (0.4 mg total) under the tongue every 5 (five) minutes as needed for chest pain. Patient not taking: Reported on 02/14/2024 10/24/20   Verlin Lonni BIRCH, MD  nystatin  (MYCOSTATIN ) 100000 UNIT/ML suspension Take 5 mLs (500,000 Units total) by mouth 4 (four) times daily. Patient taking differently: Take 5 mLs by mouth 4 (four) times daily as needed (for thrush). 11/04/22   Nafziger, Darleene, NP  OXYGEN Inhale 2.5-3.5 L/min into the lungs continuous.    [provider]  PARoxetine  (PAXIL ) 40 MG tablet Take 0.5 tablets (20 mg total) by mouth in the morning. TAKE 1 TABLET(40 MG) BY MOUTH EVERY MORNING Patient taking differently: Take 40 mg by mouth in the morning. 04/30/23   Nafziger, Darleene, NP  promethazine -dextromethorphan  (PROMETHAZINE -DM) 6.25-15 MG/5ML syrup Take 5 mLs by mouth every 6 (six) hours as needed for cough. 01/19/24   [provider]  rosuvastatin  (CRESTOR ) 20 MG tablet Take 1 tablet (20 mg total) by mouth daily. Patient not taking: Reported on 02/14/2024 08/05/23   Verlin Lonni BIRCH, MD  SENOKOT 8.6 MG tablet Take 1 tablet by mouth 2 (two) times daily as needed for constipation. Patient not taking: Reported  on 02/14/2024    [provider]    Allergies: Tape, Daliresp  [roflumilast ], Penicillins, Cozaar  [losartan ], Dupixent  [dupilumab ], Latex, Omnicef  [cefdinir ], Statins, Torsemide , Trazodone  and nefazodone, and Zestril  [lisinopril ]    Review of Systems  Updated Vital Signs BP (!) 136/97   Pulse 86   Temp 98 F (36.7 C) (Oral)   Resp 20   SpO2 100%   Physical Exam Vitals and nursing note reviewed.  Constitutional:      General: She is not in acute distress.    Appearance: She is well-developed. She is not diaphoretic.  HENT:     Head: Normocephalic and atraumatic.  Eyes:     Pupils: Pupils are equal, round, and reactive to light.  Cardiovascular:     Rate and Rhythm: Normal rate and regular rhythm.     Heart sounds: No murmur heard.    No friction rub. No gallop.  Pulmonary:  Effort: Pulmonary effort is normal.     Breath sounds: Wheezing present. No rales.     Comments: Tachypnea prolonged expiratory effort Abdominal:     General: There is no distension.     Palpations: Abdomen is soft.     Tenderness: There is no abdominal tenderness.  Musculoskeletal:        General: No tenderness.     Cervical back: Normal range of motion and neck supple.  Skin:    General: Skin is warm and dry.  Neurological:     Mental Status: She is alert.     Comments: Patient seems mildly confused  Psychiatric:        Behavior: Behavior normal.     (all labs ordered are listed, but only abnormal results are displayed) Labs Reviewed  CBC WITH DIFFERENTIAL/PLATELET - Abnormal; Notable for the following components:      Result Value   WBC 13.2 (*)    MCV 100.2 (*)    MCHC 29.8 (*)    RDW 16.1 (*)    Platelets 448 (*)    Neutro Abs 11.2 (*)    Abs Immature Granulocytes 0.33 (*)    All other components within normal limits  COMPREHENSIVE METABOLIC PANEL WITH GFR - Abnormal; Notable for the following components:   Potassium 5.3 (*)    Glucose, Bld 163 (*)    BUN 31 (*)     Creatinine, Ser 1.44 (*)    GFR, Estimated 39 (*)    All other components within normal limits  BRAIN NATRIURETIC PEPTIDE - Abnormal; Notable for the following components:   B Natriuretic Peptide 296.0 (*)    All other components within normal limits  I-STAT VENOUS BLOOD GAS, ED - Abnormal; Notable for the following components:   pH, Ven 7.445 (*)    pO2, Ven 121 (*)    Bicarbonate 35.1 (*)    TCO2 37 (*)    Acid-Base Excess 9.0 (*)    Potassium 5.3 (*)    Calcium , Ion 1.04 (*)    All other components within normal limits  TROPONIN I (HIGH SENSITIVITY) - Abnormal; Notable for the following components:   Troponin I (High Sensitivity) 36 (*)    All other components within normal limits  RESP PANEL BY RT-PCR (RSV, FLU A&B, COVID)  [id number]    EKG: EKG Interpretation Date/Time:  Friday March 11 2024 16:34:51 EST Ventricular Rate:  94 PR Interval:  177 QRS Duration:  112 QT Interval:  314 QTC Calculation: 393 R Axis:   79  Text Interpretation: Sinus rhythm Borderline intraventricular conduction delay Since last tracing rate faster Otherwise no significant change Confirmed by Emil Share [phone]) on 03/11/2024 5:02:48 PM  Radiology: [facility] Chest Port 1 View Result Date: 03/11/2024 EXAM: 1 VIEW(S) XRAY OF THE CHEST 03/11/2024 05:51:00 PM COMPARISON: 02/14/2024 CLINICAL HISTORY: sob sob FINDINGS: LUNGS AND PLEURA: No focal pulmonary opacity. No pleural effusion. No pneumothorax. HEART AND MEDIASTINUM: No acute abnormality of the cardiac and mediastinal silhouettes. BONES AND SOFT TISSUES: No acute osseous abnormality. IMPRESSION: 1. No acute cardiopulmonary process. Electronically signed by: Franky Crease MD 03/11/2024 06:24 PM EST RP Workstation: [id number]     Procedures   Medications Ordered in the ED  ipratropium-albuterol  (DUONEB) 0.5-2.5 (3) MG/3ML nebulizer solution 3 mL (3 mLs Nebulization Not Given 03/11/24 1747)  magnesium  sulfate IVPB 2 g 50 mL (2 g Intravenous New Bag/Given  03/11/24 1744)  methylPREDNISolone  sodium succinate (SOLU-MEDROL ) 125 mg/2 mL injection 125 mg (125 mg Intravenous Given  03/11/24 1740)                                    Medical Decision Making Amount and/or Complexity of Data Reviewed Labs: ordered. Radiology: ordered.  Risk Prescription drug management.   68 yo F with a chief complaint of difficulty breathing.  She tells me is been going on for months but she seems a bit confused.  EMS says worse over the past couple days.  Lung sounds consistent with COPD.  Will give 3 DuoNebs back-to-back steroids Solu-Medrol .  Chest x-ray blood work reassess.  Chest x-ray only independent or potation without obvious occlusion or pneumothorax.  Leukocytosis.  Troponin less than last check.  Venous blood gas without hypercarbia.  I discussed results with patient and family.  She is feeling much better after beta agonist therapy and steroids.  Offered admission.  Currently declining would prefer to go home.  PCP follow-up.  7:25 PM:  I have discussed the diagnosis/risks/treatment options with the patient and family.  Evaluation and diagnostic testing in the emergency department does not suggest an emergent condition requiring admission or immediate intervention beyond what has been performed at this time.  They will follow up with PCP. We also discussed returning to the ED immediately if new or worsening sx occur. We discussed the sx which are most concerning (e.g., sudden worsening pain, fever, inability to tolerate by mouth, sob) that necessitate immediate return. Medications administered to the patient during their visit and any new prescriptions provided to the patient are listed below.  Medications given during this visit Medications  ipratropium-albuterol  (DUONEB) 0.5-2.5 (3) MG/3ML nebulizer solution 3 mL (3 mLs Nebulization Not Given 03/11/24 1747)  magnesium  sulfate IVPB 2 g 50 mL (2 g Intravenous New Bag/Given 03/11/24 1744)  methylPREDNISolone   sodium succinate (SOLU-MEDROL ) 125 mg/2 mL injection 125 mg (125 mg Intravenous Given 03/11/24 1740)     The patient appears reasonably screen and/or stabilized for discharge and I doubt any other medical condition or other [facility] requiring further screening, evaluation, or treatment in the ED at this time prior to discharge.       Final diagnoses:  COPD exacerbation [facility])    ED Discharge Orders          Ordered    doxycycline  (VIBRAMYCIN ) 100 MG capsule  2 times daily        03/11/24 1912               Emil Share, DO 03/11/24 1925

## 2024-04-07 DEATH — deceased
